# Patient Record
Sex: Male | Born: 1950 | Race: Black or African American | Hispanic: No | Smoking: Never smoker
Health system: Southern US, Community
[De-identification: ages and names within clinical notes are randomized; demographics above are authoritative.]

## PROBLEM LIST (undated history)

## (undated) ENCOUNTER — Emergency Department (HOSPITAL_COMMUNITY): Admission: EM | Payer: Medicare Other | Source: Home / Self Care

## (undated) ENCOUNTER — Emergency Department (HOSPITAL_COMMUNITY): Payer: 59

## (undated) DIAGNOSIS — J189 Pneumonia, unspecified organism: Secondary | ICD-10-CM

## (undated) DIAGNOSIS — N189 Chronic kidney disease, unspecified: Secondary | ICD-10-CM

## (undated) DIAGNOSIS — G894 Chronic pain syndrome: Secondary | ICD-10-CM

## (undated) DIAGNOSIS — E119 Type 2 diabetes mellitus without complications: Secondary | ICD-10-CM

## (undated) DIAGNOSIS — I5032 Chronic diastolic (congestive) heart failure: Secondary | ICD-10-CM

## (undated) DIAGNOSIS — F419 Anxiety disorder, unspecified: Secondary | ICD-10-CM

## (undated) DIAGNOSIS — M1990 Unspecified osteoarthritis, unspecified site: Secondary | ICD-10-CM

## (undated) DIAGNOSIS — E349 Endocrine disorder, unspecified: Secondary | ICD-10-CM

## (undated) DIAGNOSIS — D631 Anemia in chronic kidney disease: Secondary | ICD-10-CM

## (undated) DIAGNOSIS — R1319 Other dysphagia: Secondary | ICD-10-CM

## (undated) DIAGNOSIS — I471 Supraventricular tachycardia, unspecified: Secondary | ICD-10-CM

## (undated) DIAGNOSIS — R51 Headache: Secondary | ICD-10-CM

## (undated) DIAGNOSIS — K5790 Diverticulosis of intestine, part unspecified, without perforation or abscess without bleeding: Secondary | ICD-10-CM

## (undated) DIAGNOSIS — D509 Iron deficiency anemia, unspecified: Secondary | ICD-10-CM

## (undated) DIAGNOSIS — N281 Cyst of kidney, acquired: Secondary | ICD-10-CM

## (undated) DIAGNOSIS — Z9841 Cataract extraction status, right eye: Secondary | ICD-10-CM

## (undated) DIAGNOSIS — E1140 Type 2 diabetes mellitus with diabetic neuropathy, unspecified: Secondary | ICD-10-CM

## (undated) DIAGNOSIS — I700 Atherosclerosis of aorta: Secondary | ICD-10-CM

## (undated) DIAGNOSIS — D649 Anemia, unspecified: Secondary | ICD-10-CM

## (undated) DIAGNOSIS — C9000 Multiple myeloma not having achieved remission: Secondary | ICD-10-CM

## (undated) DIAGNOSIS — I10 Essential (primary) hypertension: Secondary | ICD-10-CM

## (undated) DIAGNOSIS — M2041 Other hammer toe(s) (acquired), right foot: Secondary | ICD-10-CM

## (undated) DIAGNOSIS — E559 Vitamin D deficiency, unspecified: Secondary | ICD-10-CM

## (undated) DIAGNOSIS — Z95828 Presence of other vascular implants and grafts: Secondary | ICD-10-CM

## (undated) DIAGNOSIS — E785 Hyperlipidemia, unspecified: Secondary | ICD-10-CM

## (undated) DIAGNOSIS — R339 Retention of urine, unspecified: Secondary | ICD-10-CM

## (undated) DIAGNOSIS — D2262 Melanocytic nevi of left upper limb, including shoulder: Secondary | ICD-10-CM

## (undated) DIAGNOSIS — R519 Headache, unspecified: Secondary | ICD-10-CM

## (undated) DIAGNOSIS — Z8489 Family history of other specified conditions: Secondary | ICD-10-CM

## (undated) DIAGNOSIS — N529 Male erectile dysfunction, unspecified: Secondary | ICD-10-CM

## (undated) DIAGNOSIS — Z79891 Long term (current) use of opiate analgesic: Secondary | ICD-10-CM

## (undated) DIAGNOSIS — N400 Enlarged prostate without lower urinary tract symptoms: Secondary | ICD-10-CM

## (undated) DIAGNOSIS — I493 Ventricular premature depolarization: Secondary | ICD-10-CM

## (undated) DIAGNOSIS — L720 Epidermal cyst: Secondary | ICD-10-CM

## (undated) DIAGNOSIS — M2550 Pain in unspecified joint: Secondary | ICD-10-CM

## (undated) DIAGNOSIS — K219 Gastro-esophageal reflux disease without esophagitis: Secondary | ICD-10-CM

## (undated) DIAGNOSIS — N289 Disorder of kidney and ureter, unspecified: Secondary | ICD-10-CM

## (undated) DIAGNOSIS — M797 Fibromyalgia: Secondary | ICD-10-CM

## (undated) DIAGNOSIS — N184 Chronic kidney disease, stage 4 (severe): Secondary | ICD-10-CM

## (undated) HISTORY — DX: Hyperlipidemia, unspecified: E78.5

## (undated) HISTORY — DX: Pain in unspecified joint: M25.50

## (undated) HISTORY — DX: Endocrine disorder, unspecified: E34.9

## (undated) HISTORY — PX: COLONOSCOPY: SHX174

## (undated) HISTORY — DX: Type 2 diabetes mellitus with diabetic neuropathy, unspecified: E11.40

## (undated) HISTORY — DX: Multiple myeloma not having achieved remission: C90.00

## (undated) HISTORY — PX: FRACTURE SURGERY: SHX138

## (undated) HISTORY — DX: Diverticulosis of intestine, part unspecified, without perforation or abscess without bleeding: K57.90

## (undated) HISTORY — PX: KNEE SURGERY: SHX244

## (undated) HISTORY — DX: Chronic kidney disease, unspecified: N18.9

## (undated) HISTORY — DX: Anxiety disorder, unspecified: F41.9

## (undated) HISTORY — DX: Anemia in chronic kidney disease: D63.1

## (undated) HISTORY — DX: Anemia, unspecified: D64.9

## (undated) HISTORY — PX: TONSILLECTOMY: SUR1361

## (undated) HISTORY — DX: Iron deficiency anemia, unspecified: D50.9

## (undated) HISTORY — DX: Unspecified osteoarthritis, unspecified site: M19.90

## (undated) HISTORY — DX: Type 2 diabetes mellitus without complications: E11.9

## (undated) HISTORY — PX: ESOPHAGOGASTRODUODENOSCOPY: SHX1529

## (undated) HISTORY — PX: CATARACT EXTRACTION: SUR2

## (undated) HISTORY — PX: PORTACATH PLACEMENT: SHX2246

## (undated) HISTORY — PX: KNEE ARTHROSCOPY: SUR90

## (undated) HISTORY — DX: Retention of urine, unspecified: R33.9

---
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
## 1997-09-12 ENCOUNTER — Emergency Department (HOSPITAL_COMMUNITY): Admission: EM | Admit: 1997-09-12 | Discharge: 1997-09-12 | Payer: Self-pay | Admitting: Emergency Medicine

## 2000-12-02 ENCOUNTER — Emergency Department (HOSPITAL_COMMUNITY): Admission: EM | Admit: 2000-12-02 | Discharge: 2000-12-02 | Payer: Self-pay | Admitting: *Deleted

## 2000-12-06 ENCOUNTER — Emergency Department (HOSPITAL_COMMUNITY): Admission: EM | Admit: 2000-12-06 | Discharge: 2000-12-06 | Payer: Self-pay | Admitting: Emergency Medicine

## 2005-12-11 ENCOUNTER — Emergency Department (HOSPITAL_COMMUNITY): Admission: EM | Admit: 2005-12-11 | Discharge: 2005-12-11 | Payer: Self-pay | Admitting: *Deleted

## 2006-02-08 ENCOUNTER — Emergency Department (HOSPITAL_COMMUNITY): Admission: EM | Admit: 2006-02-08 | Discharge: 2006-02-08 | Payer: Self-pay | Admitting: Emergency Medicine

## 2006-03-16 ENCOUNTER — Emergency Department (HOSPITAL_COMMUNITY): Admission: EM | Admit: 2006-03-16 | Discharge: 2006-03-16 | Payer: Self-pay | Admitting: Emergency Medicine

## 2006-08-28 ENCOUNTER — Emergency Department (HOSPITAL_COMMUNITY): Admission: EM | Admit: 2006-08-28 | Discharge: 2006-08-28 | Payer: Self-pay | Admitting: Emergency Medicine

## 2006-08-31 ENCOUNTER — Ambulatory Visit: Payer: Self-pay | Admitting: Hematology & Oncology

## 2006-08-31 ENCOUNTER — Inpatient Hospital Stay (HOSPITAL_COMMUNITY): Admission: AD | Admit: 2006-08-31 | Discharge: 2006-09-07 | Payer: Self-pay | Admitting: Internal Medicine

## 2006-08-31 ENCOUNTER — Emergency Department (HOSPITAL_COMMUNITY): Admission: EM | Admit: 2006-08-31 | Discharge: 2006-08-31 | Payer: Self-pay | Admitting: Emergency Medicine

## 2006-09-22 ENCOUNTER — Emergency Department (HOSPITAL_COMMUNITY): Admission: EM | Admit: 2006-09-22 | Discharge: 2006-09-22 | Payer: Self-pay | Admitting: Family Medicine

## 2006-09-28 ENCOUNTER — Ambulatory Visit: Payer: Self-pay | Admitting: Hematology & Oncology

## 2006-10-21 ENCOUNTER — Ambulatory Visit: Payer: Self-pay | Admitting: Gastroenterology

## 2006-10-27 LAB — CBC & DIFF AND RETIC
BASO%: 0.8 % (ref 0.0–2.0)
EOS%: 0.7 % (ref 0.0–7.0)
HCT: 32.6 % — ABNORMAL LOW (ref 38.7–49.9)
IRF: 0.22 (ref 0.070–0.380)
MCH: 26.5 pg — ABNORMAL LOW (ref 28.0–33.4)
MCHC: 34 g/dL (ref 32.0–35.9)
NEUT%: 56.1 % (ref 40.0–75.0)
RBC: 4.19 10*6/uL — ABNORMAL LOW (ref 4.20–5.71)
Retic %: 0.2 % — ABNORMAL LOW (ref 0.7–2.3)
WBC: 7.8 10*3/uL (ref 4.0–10.0)
lymph#: 2.7 10*3/uL (ref 0.9–3.3)

## 2006-10-29 LAB — LACTATE DEHYDROGENASE: LDH: 170 U/L (ref 94–250)

## 2006-10-29 LAB — COMPREHENSIVE METABOLIC PANEL
AST: 45 U/L — ABNORMAL HIGH (ref 0–37)
BUN: 36 mg/dL — ABNORMAL HIGH (ref 6–23)
CO2: 22 mEq/L (ref 19–32)
Calcium: 9.5 mg/dL (ref 8.4–10.5)
Chloride: 103 mEq/L (ref 96–112)
Creatinine, Ser: 1.42 mg/dL (ref 0.40–1.50)
Total Bilirubin: 0.3 mg/dL (ref 0.3–1.2)

## 2006-10-29 LAB — SPEP & IFE WITH QIG
Alpha-1-Globulin: 3.5 % (ref 2.9–4.9)
Alpha-2-Globulin: 13 % — ABNORMAL HIGH (ref 7.1–11.8)
Total Protein, Serum Electrophoresis: 8.5 g/dL — ABNORMAL HIGH (ref 6.0–8.3)

## 2006-10-29 LAB — RPR

## 2006-10-29 LAB — BETA 2 MICROGLOBULIN, SERUM: Beta-2 Microglobulin: 1.77 mg/L — ABNORMAL HIGH (ref 1.01–1.73)

## 2006-10-29 LAB — ERYTHROPOIETIN: Erythropoietin: 7.7 m[IU]/mL (ref 2.6–34.0)

## 2006-11-08 ENCOUNTER — Encounter: Payer: Self-pay | Admitting: Hematology & Oncology

## 2006-11-08 ENCOUNTER — Ambulatory Visit (HOSPITAL_COMMUNITY): Admission: RE | Admit: 2006-11-08 | Discharge: 2006-11-08 | Payer: Self-pay | Admitting: Hematology & Oncology

## 2006-11-09 ENCOUNTER — Ambulatory Visit: Payer: Self-pay | Admitting: Gastroenterology

## 2006-11-13 ENCOUNTER — Emergency Department (HOSPITAL_COMMUNITY): Admission: EM | Admit: 2006-11-13 | Discharge: 2006-11-13 | Payer: Self-pay | Admitting: Emergency Medicine

## 2006-12-03 ENCOUNTER — Ambulatory Visit: Payer: Self-pay | Admitting: Hematology & Oncology

## 2006-12-15 ENCOUNTER — Ambulatory Visit (HOSPITAL_COMMUNITY): Admission: RE | Admit: 2006-12-15 | Discharge: 2006-12-15 | Payer: Self-pay | Admitting: Hematology & Oncology

## 2006-12-17 LAB — BASIC METABOLIC PANEL
CO2: 24 mEq/L (ref 19–32)
Calcium: 9.5 mg/dL (ref 8.4–10.5)
Chloride: 104 mEq/L (ref 96–112)
Potassium: 4 mEq/L (ref 3.5–5.3)
Sodium: 137 mEq/L (ref 135–145)

## 2006-12-19 ENCOUNTER — Inpatient Hospital Stay (HOSPITAL_COMMUNITY): Admission: EM | Admit: 2006-12-19 | Discharge: 2006-12-25 | Payer: Self-pay | Admitting: Emergency Medicine

## 2006-12-19 ENCOUNTER — Ambulatory Visit: Payer: Self-pay | Admitting: Oncology

## 2007-01-04 LAB — CBC WITH DIFFERENTIAL/PLATELET
Basophils Absolute: 0.1 10*3/uL (ref 0.0–0.1)
Eosinophils Absolute: 0.2 10*3/uL (ref 0.0–0.5)
HCT: 34.4 % — ABNORMAL LOW (ref 38.7–49.9)
HGB: 10.9 g/dL — ABNORMAL LOW (ref 13.0–17.1)
MCV: 78.9 fL — ABNORMAL LOW (ref 81.6–98.0)
MONO%: 9.6 % (ref 0.0–13.0)
NEUT#: 4.3 10*3/uL (ref 1.5–6.5)
NEUT%: 58 % (ref 40.0–75.0)
Platelets: 388 10*3/uL (ref 145–400)
RDW: 13.1 % (ref 11.2–14.6)

## 2007-01-11 ENCOUNTER — Inpatient Hospital Stay (HOSPITAL_COMMUNITY): Admission: EM | Admit: 2007-01-11 | Discharge: 2007-01-15 | Payer: Self-pay | Admitting: Emergency Medicine

## 2007-01-19 ENCOUNTER — Emergency Department (HOSPITAL_COMMUNITY): Admission: EM | Admit: 2007-01-19 | Discharge: 2007-01-20 | Payer: Self-pay | Admitting: Emergency Medicine

## 2007-01-19 ENCOUNTER — Ambulatory Visit: Payer: Self-pay | Admitting: Oncology

## 2007-02-02 LAB — CBC WITH DIFFERENTIAL/PLATELET
Basophils Absolute: 0.1 10*3/uL (ref 0.0–0.1)
EOS%: 2.9 % (ref 0.0–7.0)
Eosinophils Absolute: 0.2 10*3/uL (ref 0.0–0.5)
LYMPH%: 24.8 % (ref 14.0–48.0)
MCH: 25.7 pg — ABNORMAL LOW (ref 28.0–33.4)
MCV: 78.6 fL — ABNORMAL LOW (ref 81.6–98.0)
MONO%: 8.1 % (ref 0.0–13.0)
NEUT#: 4 10*3/uL (ref 1.5–6.5)
Platelets: 178 10*3/uL (ref 145–400)
RBC: 4.61 10*6/uL (ref 4.20–5.71)

## 2007-02-02 LAB — ERYTHROCYTE SEDIMENTATION RATE: Sed Rate: 25 mm/hr — ABNORMAL HIGH (ref 0–20)

## 2007-02-04 LAB — KAPPA/LAMBDA LIGHT CHAINS
Kappa free light chain: 0.96 mg/dL (ref 0.33–1.94)
Kappa:Lambda Ratio: 0.74 (ref 0.26–1.65)
Lambda Free Lght Chn: 1.29 mg/dL (ref 0.57–2.63)

## 2007-02-04 LAB — LACTATE DEHYDROGENASE: LDH: 158 U/L (ref 94–250)

## 2007-02-04 LAB — COMPREHENSIVE METABOLIC PANEL
CO2: 22 mEq/L (ref 19–32)
Creatinine, Ser: 1.36 mg/dL (ref 0.40–1.50)
Glucose, Bld: 99 mg/dL (ref 70–99)
Total Bilirubin: 0.5 mg/dL (ref 0.3–1.2)

## 2007-02-04 LAB — PROTEIN ELECTROPHORESIS, SERUM
Albumin ELP: 52.6 % — ABNORMAL LOW (ref 55.8–66.1)
Alpha-1-Globulin: 3.4 % (ref 2.9–4.9)
Beta 2: 4.4 % (ref 3.2–6.5)
Beta Globulin: 5.5 % (ref 4.7–7.2)
Total Protein, Serum Electrophoresis: 8 g/dL (ref 6.0–8.3)

## 2007-02-04 LAB — IGG, IGA, IGM
IgG (Immunoglobin G), Serum: 1830 mg/dL — ABNORMAL HIGH (ref 694–1618)
IgM, Serum: 41 mg/dL — ABNORMAL LOW (ref 60–263)

## 2007-02-15 LAB — CBC WITH DIFFERENTIAL/PLATELET
BASO%: 1 % (ref 0.0–2.0)
Basophils Absolute: 0.1 10*3/uL (ref 0.0–0.1)
EOS%: 2.4 % (ref 0.0–7.0)
HGB: 11.7 g/dL — ABNORMAL LOW (ref 13.0–17.1)
MCH: 25.5 pg — ABNORMAL LOW (ref 28.0–33.4)
MCV: 80.1 fL — ABNORMAL LOW (ref 81.6–98.0)
MONO%: 13 % (ref 0.0–13.0)
RBC: 4.58 10*6/uL (ref 4.20–5.71)
RDW: 12.9 % (ref 11.2–14.6)
lymph#: 2.5 10*3/uL (ref 0.9–3.3)

## 2007-02-23 ENCOUNTER — Ambulatory Visit: Payer: Self-pay | Admitting: Oncology

## 2007-02-23 ENCOUNTER — Ambulatory Visit: Payer: Self-pay | Admitting: Hematology & Oncology

## 2007-03-01 LAB — CBC WITH DIFFERENTIAL/PLATELET
Basophils Absolute: 0.1 10*3/uL (ref 0.0–0.1)
Eosinophils Absolute: 0.2 10*3/uL (ref 0.0–0.5)
HGB: 10.9 g/dL — ABNORMAL LOW (ref 13.0–17.1)
MONO#: 1.2 10*3/uL — ABNORMAL HIGH (ref 0.1–0.9)
MONO%: 21.4 % — ABNORMAL HIGH (ref 0.0–13.0)
NEUT#: 2.6 10*3/uL (ref 1.5–6.5)
RBC: 4.17 10*6/uL — ABNORMAL LOW (ref 4.20–5.71)
RDW: 13.2 % (ref 11.2–14.6)
WBC: 5.6 10*3/uL (ref 4.0–10.0)
lymph#: 1.5 10*3/uL (ref 0.9–3.3)

## 2007-03-03 LAB — SPEP & IFE WITH QIG
Albumin ELP: 54.3 % — ABNORMAL LOW (ref 55.8–66.1)
Alpha-1-Globulin: 3.9 % (ref 2.9–4.9)
Gamma Globulin: 17.6 % (ref 11.1–18.8)
IgM, Serum: 37 mg/dL — ABNORMAL LOW (ref 60–263)

## 2007-03-03 LAB — COMPREHENSIVE METABOLIC PANEL
ALT: 29 U/L (ref 0–53)
AST: 21 U/L (ref 0–37)
Albumin: 4.1 g/dL (ref 3.5–5.2)
Calcium: 9.1 mg/dL (ref 8.4–10.5)
Chloride: 107 mEq/L (ref 96–112)
Potassium: 4.2 mEq/L (ref 3.5–5.3)

## 2007-03-14 ENCOUNTER — Inpatient Hospital Stay (HOSPITAL_COMMUNITY): Admission: EM | Admit: 2007-03-14 | Discharge: 2007-03-18 | Payer: Self-pay | Admitting: Emergency Medicine

## 2007-03-22 LAB — CBC WITH DIFFERENTIAL/PLATELET
BASO%: 3.5 % — ABNORMAL HIGH (ref 0.0–2.0)
HCT: 32.4 % — ABNORMAL LOW (ref 38.7–49.9)
HGB: 10.8 g/dL — ABNORMAL LOW (ref 13.0–17.1)
MCHC: 33.3 g/dL (ref 32.0–35.9)
MONO#: 1.7 10*3/uL — ABNORMAL HIGH (ref 0.1–0.9)
NEUT%: 56.2 % (ref 40.0–75.0)
RDW: 13.8 % (ref 11.2–14.6)
WBC: 8.1 10*3/uL (ref 4.0–10.0)
lymph#: 1.4 10*3/uL (ref 0.9–3.3)

## 2007-03-25 LAB — SPEP & IFE WITH QIG
Gamma Globulin: 14.3 % (ref 11.1–18.8)
IgG (Immunoglobin G), Serum: 964 mg/dL (ref 694–1618)
IgM, Serum: 29 mg/dL — ABNORMAL LOW (ref 60–263)
M-Spike, %: 0.53 g/dL

## 2007-03-25 LAB — COMPREHENSIVE METABOLIC PANEL
ALT: 50 U/L (ref 0–53)
Albumin: 4.2 g/dL (ref 3.5–5.2)
CO2: 22 mEq/L (ref 19–32)
Calcium: 9.6 mg/dL (ref 8.4–10.5)
Chloride: 106 mEq/L (ref 96–112)
Creatinine, Ser: 1.17 mg/dL (ref 0.40–1.50)
Potassium: 4.2 mEq/L (ref 3.5–5.3)
Sodium: 139 mEq/L (ref 135–145)
Total Protein: 7 g/dL (ref 6.0–8.3)

## 2007-03-25 LAB — KAPPA/LAMBDA LIGHT CHAINS

## 2007-04-01 LAB — CBC WITH DIFFERENTIAL/PLATELET
BASO%: 1.1 % (ref 0.0–2.0)
Basophils Absolute: 0.1 10*3/uL (ref 0.0–0.1)
EOS%: 2.9 % (ref 0.0–7.0)
HCT: 32.9 % — ABNORMAL LOW (ref 38.7–49.9)
HGB: 10.6 g/dL — ABNORMAL LOW (ref 13.0–17.1)
LYMPH%: 26.3 % (ref 14.0–48.0)
MCH: 24.7 pg — ABNORMAL LOW (ref 28.0–33.4)
MCHC: 32.1 g/dL (ref 32.0–35.9)
MCV: 76.9 fL — ABNORMAL LOW (ref 81.6–98.0)
MONO%: 15.4 % — ABNORMAL HIGH (ref 0.0–13.0)
NEUT%: 54.3 % (ref 40.0–75.0)

## 2007-04-03 ENCOUNTER — Emergency Department (HOSPITAL_COMMUNITY): Admission: EM | Admit: 2007-04-03 | Discharge: 2007-04-03 | Payer: Self-pay | Admitting: Emergency Medicine

## 2007-04-05 ENCOUNTER — Inpatient Hospital Stay (HOSPITAL_COMMUNITY): Admission: EM | Admit: 2007-04-05 | Discharge: 2007-04-19 | Payer: Self-pay | Admitting: Emergency Medicine

## 2007-04-05 ENCOUNTER — Ambulatory Visit: Payer: Self-pay | Admitting: Hematology & Oncology

## 2007-04-08 ENCOUNTER — Ambulatory Visit: Payer: Self-pay | Admitting: Oncology

## 2007-04-14 ENCOUNTER — Ambulatory Visit: Payer: Self-pay | Admitting: Internal Medicine

## 2007-04-20 ENCOUNTER — Emergency Department (HOSPITAL_COMMUNITY): Admission: EM | Admit: 2007-04-20 | Discharge: 2007-04-20 | Payer: Self-pay | Admitting: Emergency Medicine

## 2007-04-26 LAB — CBC WITH DIFFERENTIAL/PLATELET
BASO%: 1 % (ref 0.0–2.0)
EOS%: 5.4 % (ref 0.0–7.0)
LYMPH%: 29 % (ref 14.0–48.0)
MCH: 25.9 pg — ABNORMAL LOW (ref 28.0–33.4)
MCHC: 32.2 g/dL (ref 32.0–35.9)
MCV: 80.6 fL — ABNORMAL LOW (ref 81.6–98.0)
MONO%: 9.8 % (ref 0.0–13.0)
NEUT#: 3.2 10*3/uL (ref 1.5–6.5)
Platelets: 288 10*3/uL (ref 145–400)
RBC: 4.3 10*6/uL (ref 4.20–5.71)
RDW: 19.3 % — ABNORMAL HIGH (ref 11.2–14.6)

## 2007-04-26 LAB — COMPREHENSIVE METABOLIC PANEL
AST: 13 U/L (ref 0–37)
Albumin: 4.2 g/dL (ref 3.5–5.2)
Alkaline Phosphatase: 37 U/L — ABNORMAL LOW (ref 39–117)
Potassium: 4.1 mEq/L (ref 3.5–5.3)
Sodium: 141 mEq/L (ref 135–145)
Total Bilirubin: 0.4 mg/dL (ref 0.3–1.2)
Total Protein: 7 g/dL (ref 6.0–8.3)

## 2007-04-28 ENCOUNTER — Emergency Department (HOSPITAL_COMMUNITY): Admission: EM | Admit: 2007-04-28 | Discharge: 2007-04-29 | Payer: Self-pay | Admitting: Emergency Medicine

## 2007-04-28 LAB — BETA 2 MICROGLOBULIN, SERUM: Beta-2 Microglobulin: 2.54 mg/L — ABNORMAL HIGH (ref 1.01–1.73)

## 2007-04-28 LAB — PROTEIN ELECTROPHORESIS, SERUM
Albumin ELP: 54.7 % — ABNORMAL LOW (ref 55.8–66.1)
Beta 2: 4.5 % (ref 3.2–6.5)
Gamma Globulin: 16.7 % (ref 11.1–18.8)

## 2007-04-28 LAB — IGG, IGA, IGM: IgA: 141 mg/dL (ref 68–378)

## 2007-06-02 ENCOUNTER — Ambulatory Visit: Payer: Self-pay | Admitting: Oncology

## 2007-06-10 LAB — CBC WITH DIFFERENTIAL/PLATELET
Basophils Absolute: 0 10*3/uL (ref 0.0–0.1)
Eosinophils Absolute: 0.2 10*3/uL (ref 0.0–0.5)
HCT: 31.4 % — ABNORMAL LOW (ref 38.7–49.9)
HGB: 10.6 g/dL — ABNORMAL LOW (ref 13.0–17.1)
MCV: 78.3 fL — ABNORMAL LOW (ref 81.6–98.0)
MONO%: 6.8 % (ref 0.0–13.0)
NEUT#: 5.4 10*3/uL (ref 1.5–6.5)
RDW: 18.2 % — ABNORMAL HIGH (ref 11.2–14.6)
lymph#: 1.3 10*3/uL (ref 0.9–3.3)

## 2007-06-14 ENCOUNTER — Emergency Department (HOSPITAL_COMMUNITY): Admission: EM | Admit: 2007-06-14 | Discharge: 2007-06-14 | Payer: Self-pay | Admitting: Emergency Medicine

## 2007-06-14 LAB — PROTEIN ELECTROPHORESIS, SERUM
Albumin ELP: 56.1 % (ref 55.8–66.1)
Alpha-1-Globulin: 4.4 % (ref 2.9–4.9)

## 2007-06-14 LAB — COMPREHENSIVE METABOLIC PANEL
ALT: 25 U/L (ref 0–53)
AST: 24 U/L (ref 0–37)
Albumin: 4.6 g/dL (ref 3.5–5.2)
Alkaline Phosphatase: 44 U/L (ref 39–117)
Glucose, Bld: 112 mg/dL — ABNORMAL HIGH (ref 70–99)
Potassium: 4 mEq/L (ref 3.5–5.3)
Sodium: 141 mEq/L (ref 135–145)
Total Protein: 7.7 g/dL (ref 6.0–8.3)

## 2007-06-14 LAB — KAPPA/LAMBDA LIGHT CHAINS
Kappa free light chain: 1.67 mg/dL (ref 0.33–1.94)
Kappa:Lambda Ratio: 1.86 — ABNORMAL HIGH (ref 0.26–1.65)

## 2007-06-14 LAB — IGG, IGA, IGM
IgA: 171 mg/dL (ref 68–378)
IgM, Serum: 43 mg/dL — ABNORMAL LOW (ref 60–263)

## 2007-06-15 ENCOUNTER — Encounter (HOSPITAL_BASED_OUTPATIENT_CLINIC_OR_DEPARTMENT_OTHER): Admission: RE | Admit: 2007-06-15 | Discharge: 2007-09-13 | Payer: Self-pay | Admitting: Surgery

## 2007-07-05 ENCOUNTER — Ambulatory Visit: Payer: Self-pay | Admitting: Hematology & Oncology

## 2007-07-05 ENCOUNTER — Ambulatory Visit (HOSPITAL_COMMUNITY): Admission: RE | Admit: 2007-07-05 | Discharge: 2007-07-05 | Payer: Self-pay | Admitting: Hematology & Oncology

## 2007-07-05 ENCOUNTER — Encounter: Payer: Self-pay | Admitting: Hematology & Oncology

## 2007-07-20 ENCOUNTER — Ambulatory Visit: Payer: Self-pay | Admitting: Oncology

## 2007-07-22 LAB — CBC WITH DIFFERENTIAL/PLATELET
BASO%: 0.9 % (ref 0.0–2.0)
LYMPH%: 32.8 % (ref 14.0–48.0)
MCHC: 32.9 g/dL (ref 32.0–35.9)
MCV: 80.1 fL — ABNORMAL LOW (ref 81.6–98.0)
MONO#: 0.6 10*3/uL (ref 0.1–0.9)
MONO%: 11.8 % (ref 0.0–13.0)
NEUT#: 2.6 10*3/uL (ref 1.5–6.5)
Platelets: 228 10*3/uL (ref 145–400)
RBC: 3.74 10*6/uL — ABNORMAL LOW (ref 4.20–5.71)
RDW: 16.6 % — ABNORMAL HIGH (ref 11.2–14.6)
WBC: 5.2 10*3/uL (ref 4.0–10.0)

## 2007-07-26 LAB — COMPREHENSIVE METABOLIC PANEL
ALT: 22 U/L (ref 0–53)
Albumin: 4.5 g/dL (ref 3.5–5.2)
Alkaline Phosphatase: 42 U/L (ref 39–117)
Potassium: 3.7 mEq/L (ref 3.5–5.3)
Sodium: 141 mEq/L (ref 135–145)
Total Bilirubin: 0.3 mg/dL (ref 0.3–1.2)
Total Protein: 7.3 g/dL (ref 6.0–8.3)

## 2007-07-26 LAB — PROTEIN ELECTROPHORESIS, SERUM
Albumin ELP: 58.9 % (ref 55.8–66.1)
Alpha-1-Globulin: 3.6 % (ref 2.9–4.9)
Alpha-2-Globulin: 13.3 % — ABNORMAL HIGH (ref 7.1–11.8)
Beta 2: 4.7 % (ref 3.2–6.5)
Beta Globulin: 5.4 % (ref 4.7–7.2)

## 2007-07-26 LAB — KAPPA/LAMBDA LIGHT CHAINS: Lambda Free Lght Chn: 0.98 mg/dL (ref 0.57–2.63)

## 2007-08-19 LAB — CBC WITH DIFFERENTIAL/PLATELET
EOS%: 4.8 % (ref 0.0–7.0)
Eosinophils Absolute: 0.3 10*3/uL (ref 0.0–0.5)
LYMPH%: 29.8 % (ref 14.0–48.0)
MCH: 25.7 pg — ABNORMAL LOW (ref 28.0–33.4)
MCV: 81.8 fL (ref 81.6–98.0)
MONO%: 11.4 % (ref 0.0–13.0)
NEUT#: 3 10*3/uL (ref 1.5–6.5)
Platelets: 240 10*3/uL (ref 145–400)
RBC: 4.62 10*6/uL (ref 4.20–5.71)
RDW: 15.9 % — ABNORMAL HIGH (ref 11.2–14.6)

## 2007-09-08 ENCOUNTER — Ambulatory Visit: Payer: Self-pay | Admitting: Hematology & Oncology

## 2007-09-08 LAB — MANUAL DIFFERENTIAL (CHCC SATELLITE)
BASO: 1 % (ref 0–2)
Eos: 5 % (ref 0–7)
PLT EST ~~LOC~~: ADEQUATE

## 2007-09-08 LAB — CBC WITH DIFFERENTIAL (CANCER CENTER ONLY)
HCT: 43.7 % (ref 38.7–49.9)
HGB: 14 g/dL (ref 13.0–17.1)
MCHC: 32 g/dL (ref 32.0–35.9)
RDW: 13.1 % (ref 10.5–14.6)
WBC: 4.8 10*3/uL (ref 4.0–10.0)

## 2007-09-12 LAB — IGG, IGA, IGM
IgA: 195 mg/dL (ref 68–378)
IgG (Immunoglobin G), Serum: 1370 mg/dL (ref 694–1618)

## 2007-09-12 LAB — PROTEIN ELECTROPHORESIS, SERUM
Alpha-1-Globulin: 3.6 % (ref 2.9–4.9)
Alpha-2-Globulin: 12.6 % — ABNORMAL HIGH (ref 7.1–11.8)
Total Protein, Serum Electrophoresis: 7.5 g/dL (ref 6.0–8.3)

## 2007-09-12 LAB — COMPREHENSIVE METABOLIC PANEL
ALT: 19 U/L (ref 0–53)
AST: 20 U/L (ref 0–37)
Creatinine, Ser: 1.07 mg/dL (ref 0.40–1.50)
Total Bilirubin: 0.5 mg/dL (ref 0.3–1.2)

## 2007-09-12 LAB — KAPPA/LAMBDA LIGHT CHAINS: Kappa:Lambda Ratio: 0.67 (ref 0.26–1.65)

## 2007-09-12 LAB — BETA 2 MICROGLOBULIN, SERUM: Beta-2 Microglobulin: 2.17 mg/L — ABNORMAL HIGH (ref 1.01–1.73)

## 2007-09-14 ENCOUNTER — Emergency Department (HOSPITAL_COMMUNITY): Admission: EM | Admit: 2007-09-14 | Discharge: 2007-09-14 | Payer: Self-pay | Admitting: Emergency Medicine

## 2007-09-16 ENCOUNTER — Encounter (HOSPITAL_BASED_OUTPATIENT_CLINIC_OR_DEPARTMENT_OTHER): Admission: RE | Admit: 2007-09-16 | Discharge: 2007-10-25 | Payer: Self-pay | Admitting: Internal Medicine

## 2007-09-16 LAB — CBC WITH DIFFERENTIAL (CANCER CENTER ONLY)
BASO%: 0.6 % (ref 0.0–2.0)
EOS%: 4.3 % (ref 0.0–7.0)
LYMPH%: 34.9 % (ref 14.0–48.0)
MCHC: 32.2 g/dL (ref 32.0–35.9)
MCV: 80 fL — ABNORMAL LOW (ref 82–98)
MONO#: 0.5 10*3/uL (ref 0.1–0.9)
MONO%: 8.5 % (ref 0.0–13.0)
NEUT%: 51.7 % (ref 40.0–80.0)
Platelets: 232 10*3/uL (ref 145–400)
RDW: 12.7 % (ref 10.5–14.6)
WBC: 5.6 10*3/uL (ref 4.0–10.0)

## 2007-09-20 LAB — PROTEIN ELECTROPHORESIS, SERUM
Alpha-1-Globulin: 3.5 % (ref 2.9–4.9)
Alpha-2-Globulin: 12.2 % — ABNORMAL HIGH (ref 7.1–11.8)
Beta Globulin: 5.7 % (ref 4.7–7.2)
Total Protein, Serum Electrophoresis: 7.4 g/dL (ref 6.0–8.3)

## 2007-09-20 LAB — FERRITIN: Ferritin: 176 ng/mL (ref 22–322)

## 2007-10-01 ENCOUNTER — Emergency Department (HOSPITAL_COMMUNITY): Admission: EM | Admit: 2007-10-01 | Discharge: 2007-10-01 | Payer: Self-pay | Admitting: Emergency Medicine

## 2007-10-14 LAB — CBC WITH DIFFERENTIAL (CANCER CENTER ONLY)
BASO#: 0 10*3/uL (ref 0.0–0.2)
Eosinophils Absolute: 0.3 10*3/uL (ref 0.0–0.5)
HGB: 11.9 g/dL — ABNORMAL LOW (ref 13.0–17.1)
MCV: 76 fL — ABNORMAL LOW (ref 82–98)
MONO#: 0.4 10*3/uL (ref 0.1–0.9)
NEUT#: 2.7 10*3/uL (ref 1.5–6.5)
Platelets: 177 10*3/uL (ref 145–400)
RBC: 4.77 10*6/uL (ref 4.20–5.70)
WBC: 5.1 10*3/uL (ref 4.0–10.0)

## 2007-11-10 ENCOUNTER — Ambulatory Visit: Payer: Self-pay | Admitting: Hematology & Oncology

## 2007-11-11 LAB — CBC WITH DIFFERENTIAL (CANCER CENTER ONLY)
Eosinophils Absolute: 0.3 10*3/uL (ref 0.0–0.5)
HCT: 36 % — ABNORMAL LOW (ref 38.7–49.9)
LYMPH%: 27.1 % (ref 14.0–48.0)
MCH: 23.6 pg — ABNORMAL LOW (ref 28.0–33.4)
MCV: 73 fL — ABNORMAL LOW (ref 82–98)
MONO%: 6.8 % (ref 0.0–13.0)
NEUT%: 61.4 % (ref 40.0–80.0)
Platelets: 209 10*3/uL (ref 145–400)
RBC: 4.96 10*6/uL (ref 4.20–5.70)
RDW: 16.9 % — ABNORMAL HIGH (ref 10.5–14.6)

## 2007-11-16 LAB — COMPREHENSIVE METABOLIC PANEL
ALT: 16 U/L (ref 0–53)
AST: 16 U/L (ref 0–37)
CO2: 24 mEq/L (ref 19–32)
Chloride: 105 mEq/L (ref 96–112)
Creatinine, Ser: 1.09 mg/dL (ref 0.40–1.50)
Sodium: 140 mEq/L (ref 135–145)
Total Bilirubin: 0.4 mg/dL (ref 0.3–1.2)
Total Protein: 7.8 g/dL (ref 6.0–8.3)

## 2007-11-16 LAB — KAPPA/LAMBDA LIGHT CHAINS: Kappa:Lambda Ratio: 0.96 (ref 0.26–1.65)

## 2007-11-16 LAB — SPEP & IFE WITH QIG
Gamma Globulin: 17 % (ref 11.1–18.8)
IgG (Immunoglobin G), Serum: 1430 mg/dL (ref 694–1618)
IgM, Serum: 56 mg/dL — ABNORMAL LOW (ref 60–263)
Total Protein, Serum Electrophoresis: 7.8 g/dL (ref 6.0–8.3)

## 2007-11-16 LAB — BETA 2 MICROGLOBULIN, SERUM: Beta-2 Microglobulin: 1.76 mg/L — ABNORMAL HIGH (ref 1.01–1.73)

## 2007-12-29 ENCOUNTER — Ambulatory Visit: Payer: Self-pay | Admitting: Hematology & Oncology

## 2007-12-30 LAB — CBC WITH DIFFERENTIAL (CANCER CENTER ONLY)
BASO#: 0 10*3/uL (ref 0.0–0.2)
Eosinophils Absolute: 0.2 10*3/uL (ref 0.0–0.5)
HCT: 30.2 % — ABNORMAL LOW (ref 38.7–49.9)
HGB: 10.2 g/dL — ABNORMAL LOW (ref 13.0–17.1)
LYMPH%: 36.5 % (ref 14.0–48.0)
MCH: 25 pg — ABNORMAL LOW (ref 28.0–33.4)
MCV: 74 fL — ABNORMAL LOW (ref 82–98)
MONO#: 0.5 10*3/uL (ref 0.1–0.9)
MONO%: 7.8 % (ref 0.0–13.0)
RBC: 4.08 10*6/uL — ABNORMAL LOW (ref 4.20–5.70)

## 2008-01-03 LAB — PROTEIN ELECTROPHORESIS, SERUM
Alpha-1-Globulin: 4.1 % (ref 2.9–4.9)
Alpha-2-Globulin: 14 % — ABNORMAL HIGH (ref 7.1–11.8)
Beta 2: 5.3 % (ref 3.2–6.5)
Gamma Globulin: 16.9 % (ref 11.1–18.8)

## 2008-01-03 LAB — IGG, IGA, IGM: IgG (Immunoglobin G), Serum: 1390 mg/dL (ref 694–1618)

## 2008-01-03 LAB — COMPREHENSIVE METABOLIC PANEL
ALT: 16 U/L (ref 0–53)
AST: 17 U/L (ref 0–37)
Albumin: 4.7 g/dL (ref 3.5–5.2)
Calcium: 9.9 mg/dL (ref 8.4–10.5)
Chloride: 102 mEq/L (ref 96–112)
Potassium: 3.7 mEq/L (ref 3.5–5.3)
Sodium: 139 mEq/L (ref 135–145)
Total Protein: 8.2 g/dL (ref 6.0–8.3)

## 2008-01-03 LAB — BETA 2 MICROGLOBULIN, SERUM: Beta-2 Microglobulin: 1.91 mg/L — ABNORMAL HIGH (ref 1.01–1.73)

## 2008-01-03 LAB — PSA: PSA: 1.15 ng/mL (ref 0.10–4.00)

## 2008-03-01 ENCOUNTER — Ambulatory Visit: Payer: Self-pay | Admitting: Hematology & Oncology

## 2008-03-02 LAB — CBC WITH DIFFERENTIAL (CANCER CENTER ONLY)
BASO#: 0 10*3/uL (ref 0.0–0.2)
Eosinophils Absolute: 0.2 10*3/uL (ref 0.0–0.5)
HGB: 12.1 g/dL — ABNORMAL LOW (ref 13.0–17.1)
LYMPH#: 2.4 10*3/uL (ref 0.9–3.3)
MCH: 26.7 pg — ABNORMAL LOW (ref 28.0–33.4)
MONO#: 0.4 10*3/uL (ref 0.1–0.9)
NEUT#: 3.6 10*3/uL (ref 1.5–6.5)
RBC: 4.51 10*6/uL (ref 4.20–5.70)
WBC: 6.6 10*3/uL (ref 4.0–10.0)

## 2008-03-06 LAB — COMPREHENSIVE METABOLIC PANEL
ALT: 29 U/L (ref 0–53)
CO2: 23 mEq/L (ref 19–32)
Creatinine, Ser: 1.35 mg/dL (ref 0.40–1.50)
Total Bilirubin: 0.3 mg/dL (ref 0.3–1.2)

## 2008-03-06 LAB — SPEP & IFE WITH QIG
Albumin ELP: 53 % — ABNORMAL LOW (ref 55.8–66.1)
IgA: 281 mg/dL (ref 68–378)
IgM, Serum: 85 mg/dL (ref 60–263)
Total Protein, Serum Electrophoresis: 8.4 g/dL — ABNORMAL HIGH (ref 6.0–8.3)

## 2008-03-06 LAB — KAPPA/LAMBDA LIGHT CHAINS
Kappa free light chain: 1.92 mg/dL (ref 0.33–1.94)
Kappa:Lambda Ratio: 1.64 (ref 0.26–1.65)
Lambda Free Lght Chn: 1.17 mg/dL (ref 0.57–2.63)

## 2008-03-06 LAB — BETA 2 MICROGLOBULIN, SERUM: Beta-2 Microglobulin: 2.58 mg/L — ABNORMAL HIGH (ref 1.01–1.73)

## 2008-03-09 ENCOUNTER — Ambulatory Visit: Payer: Self-pay | Admitting: Gastroenterology

## 2008-03-09 DIAGNOSIS — K5730 Diverticulosis of large intestine without perforation or abscess without bleeding: Secondary | ICD-10-CM | POA: Insufficient documentation

## 2008-03-09 DIAGNOSIS — K219 Gastro-esophageal reflux disease without esophagitis: Secondary | ICD-10-CM

## 2008-03-09 DIAGNOSIS — R1319 Other dysphagia: Secondary | ICD-10-CM

## 2008-03-14 ENCOUNTER — Ambulatory Visit: Payer: Self-pay | Admitting: Gastroenterology

## 2008-03-15 ENCOUNTER — Encounter: Payer: Self-pay | Admitting: Gastroenterology

## 2008-04-26 ENCOUNTER — Ambulatory Visit: Payer: Self-pay | Admitting: Hematology & Oncology

## 2008-04-27 LAB — CBC WITH DIFFERENTIAL (CANCER CENTER ONLY)
BASO#: 0 10*3/uL (ref 0.0–0.2)
BASO%: 0.5 % (ref 0.0–2.0)
EOS%: 5.8 % (ref 0.0–7.0)
HGB: 11.4 g/dL — ABNORMAL LOW (ref 13.0–17.1)
LYMPH#: 2.5 10*3/uL (ref 0.9–3.3)
MCHC: 32.8 g/dL (ref 32.0–35.9)
NEUT#: 4 10*3/uL (ref 1.5–6.5)

## 2008-05-01 LAB — COMPREHENSIVE METABOLIC PANEL
AST: 19 U/L (ref 0–37)
Albumin: 4.6 g/dL (ref 3.5–5.2)
BUN: 22 mg/dL (ref 6–23)
Calcium: 9.6 mg/dL (ref 8.4–10.5)
Chloride: 100 mEq/L (ref 96–112)
Glucose, Bld: 140 mg/dL — ABNORMAL HIGH (ref 70–99)
Potassium: 3.5 mEq/L (ref 3.5–5.3)

## 2008-05-01 LAB — PROTEIN ELECTROPHORESIS, SERUM
Albumin ELP: 54.8 % — ABNORMAL LOW (ref 55.8–66.1)
Beta 2: 4.4 % (ref 3.2–6.5)
Total Protein, Serum Electrophoresis: 7.7 g/dL (ref 6.0–8.3)

## 2008-05-01 LAB — KAPPA/LAMBDA LIGHT CHAINS
Kappa free light chain: 0.9 mg/dL (ref 0.33–1.94)
Lambda Free Lght Chn: 1.01 mg/dL (ref 0.57–2.63)

## 2008-05-01 LAB — IGG, IGA, IGM: IgM, Serum: 74 mg/dL (ref 60–263)

## 2008-05-26 ENCOUNTER — Emergency Department (HOSPITAL_COMMUNITY): Admission: EM | Admit: 2008-05-26 | Discharge: 2008-05-26 | Payer: Self-pay | Admitting: Emergency Medicine

## 2008-08-01 ENCOUNTER — Ambulatory Visit: Payer: Self-pay | Admitting: Hematology & Oncology

## 2008-08-03 LAB — CBC WITH DIFFERENTIAL (CANCER CENTER ONLY)
BASO%: 0.5 % (ref 0.0–2.0)
EOS%: 4.4 % (ref 0.0–7.0)
HCT: 32.5 % — ABNORMAL LOW (ref 38.7–49.9)
LYMPH#: 2.7 10*3/uL (ref 0.9–3.3)
MCHC: 32.4 g/dL (ref 32.0–35.9)
NEUT#: 3.2 10*3/uL (ref 1.5–6.5)
NEUT%: 48.3 % (ref 40.0–80.0)
RDW: 13.2 % (ref 10.5–14.6)

## 2008-08-07 LAB — KAPPA/LAMBDA LIGHT CHAINS
Kappa:Lambda Ratio: 1.36 (ref 0.26–1.65)
Lambda Free Lght Chn: 1.18 mg/dL (ref 0.57–2.63)

## 2008-08-07 LAB — PROTEIN ELECTROPHORESIS, SERUM
Alpha-2-Globulin: 13.6 % — ABNORMAL HIGH (ref 7.1–11.8)
Gamma Globulin: 18 % (ref 11.1–18.8)

## 2008-08-07 LAB — COMPREHENSIVE METABOLIC PANEL
Albumin: 4.3 g/dL (ref 3.5–5.2)
BUN: 13 mg/dL (ref 6–23)
Calcium: 9.6 mg/dL (ref 8.4–10.5)
Chloride: 103 mEq/L (ref 96–112)
Glucose, Bld: 135 mg/dL — ABNORMAL HIGH (ref 70–99)
Potassium: 3.6 mEq/L (ref 3.5–5.3)

## 2008-11-01 ENCOUNTER — Ambulatory Visit: Payer: Self-pay | Admitting: Hematology & Oncology

## 2008-11-02 LAB — CBC WITH DIFFERENTIAL (CANCER CENTER ONLY)
BASO%: 0.6 % (ref 0.0–2.0)
HCT: 33.2 % — ABNORMAL LOW (ref 38.7–49.9)
HGB: 10.9 g/dL — ABNORMAL LOW (ref 13.0–17.1)
LYMPH#: 2.6 10*3/uL (ref 0.9–3.3)
MONO#: 0.6 10*3/uL (ref 0.1–0.9)
NEUT%: 54.7 % (ref 40.0–80.0)
RDW: 14.5 % (ref 10.5–14.6)
WBC: 7.7 10*3/uL (ref 4.0–10.0)

## 2008-11-02 LAB — CHCC SATELLITE - SMEAR

## 2008-11-06 LAB — TESTOSTERONE: Testosterone: 328.06 ng/dL — ABNORMAL LOW (ref 350–890)

## 2008-11-06 LAB — VITAMIN D 25 HYDROXY (VIT D DEFICIENCY, FRACTURES): Vit D, 25-Hydroxy: 26 ng/mL — ABNORMAL LOW (ref 30–89)

## 2008-11-06 LAB — SPEP & IFE WITH QIG
Albumin ELP: 50.8 % — ABNORMAL LOW (ref 55.8–66.1)
Alpha-1-Globulin: 4.1 % (ref 2.9–4.9)
Beta 2: 5.8 % (ref 3.2–6.5)
Beta Globulin: 5.8 % (ref 4.7–7.2)

## 2008-11-06 LAB — COMPREHENSIVE METABOLIC PANEL
Alkaline Phosphatase: 58 U/L (ref 39–117)
Glucose, Bld: 102 mg/dL — ABNORMAL HIGH (ref 70–99)
Sodium: 139 mEq/L (ref 135–145)
Total Bilirubin: 0.5 mg/dL (ref 0.3–1.2)
Total Protein: 8.4 g/dL — ABNORMAL HIGH (ref 6.0–8.3)

## 2008-11-06 LAB — KAPPA/LAMBDA LIGHT CHAINS
Kappa free light chain: 1.66 mg/dL (ref 0.33–1.94)
Lambda Free Lght Chn: 1.71 mg/dL (ref 0.57–2.63)

## 2008-11-27 ENCOUNTER — Emergency Department (HOSPITAL_COMMUNITY): Admission: EM | Admit: 2008-11-27 | Discharge: 2008-11-27 | Payer: Self-pay | Admitting: Emergency Medicine

## 2009-01-31 ENCOUNTER — Ambulatory Visit: Payer: Self-pay | Admitting: Hematology & Oncology

## 2009-02-01 LAB — CBC WITH DIFFERENTIAL (CANCER CENTER ONLY)
BASO%: 0.6 % (ref 0.0–2.0)
LYMPH#: 2.5 10*3/uL (ref 0.9–3.3)
LYMPH%: 28.5 % (ref 14.0–48.0)
MONO#: 0.7 10*3/uL (ref 0.1–0.9)
NEUT#: 5.3 10*3/uL (ref 1.5–6.5)
Platelets: 286 10*3/uL (ref 145–400)
RDW: 15.2 % — ABNORMAL HIGH (ref 10.5–14.6)
WBC: 8.8 10*3/uL (ref 4.0–10.0)

## 2009-02-05 LAB — COMPREHENSIVE METABOLIC PANEL
ALT: 19 U/L (ref 0–53)
AST: 24 U/L (ref 0–37)
CO2: 27 mEq/L (ref 19–32)
Calcium: 9.6 mg/dL (ref 8.4–10.5)
Chloride: 100 mEq/L (ref 96–112)
Creatinine, Ser: 1.35 mg/dL (ref 0.40–1.50)
Potassium: 3.7 mEq/L (ref 3.5–5.3)
Sodium: 138 mEq/L (ref 135–145)
Total Protein: 8.1 g/dL (ref 6.0–8.3)

## 2009-02-05 LAB — SPEP & IFE WITH QIG
Alpha-1-Globulin: 3.9 % (ref 2.9–4.9)
Gamma Globulin: 19.5 % — ABNORMAL HIGH (ref 11.1–18.8)
IgM, Serum: 74 mg/dL (ref 60–263)
M-Spike, %: 0.49 g/dL
Total Protein, Serum Electrophoresis: 8.1 g/dL (ref 6.0–8.3)

## 2009-02-05 LAB — KAPPA/LAMBDA LIGHT CHAINS: Kappa free light chain: 0.99 mg/dL (ref 0.33–1.94)

## 2009-02-05 LAB — FERRITIN: Ferritin: 76 ng/mL (ref 22–322)

## 2009-04-01 ENCOUNTER — Ambulatory Visit: Payer: Self-pay | Admitting: Gastroenterology

## 2009-04-04 ENCOUNTER — Ambulatory Visit (HOSPITAL_COMMUNITY): Admission: RE | Admit: 2009-04-04 | Discharge: 2009-04-04 | Payer: Self-pay | Admitting: Gastroenterology

## 2009-04-22 ENCOUNTER — Ambulatory Visit (HOSPITAL_COMMUNITY): Admission: RE | Admit: 2009-04-22 | Discharge: 2009-04-22 | Payer: Self-pay | Admitting: Gastroenterology

## 2009-04-22 ENCOUNTER — Ambulatory Visit: Payer: Self-pay | Admitting: Gastroenterology

## 2009-05-02 ENCOUNTER — Ambulatory Visit: Payer: Self-pay | Admitting: Hematology & Oncology

## 2009-05-03 ENCOUNTER — Encounter: Payer: Self-pay | Admitting: Gastroenterology

## 2009-05-03 LAB — CBC WITH DIFFERENTIAL (CANCER CENTER ONLY)
BASO%: 0.7 % (ref 0.0–2.0)
HCT: 38.3 % — ABNORMAL LOW (ref 38.7–49.9)
LYMPH%: 34.1 % (ref 14.0–48.0)
MCH: 24.4 pg — ABNORMAL LOW (ref 28.0–33.4)
MCV: 77 fL — ABNORMAL LOW (ref 82–98)
MONO%: 7.5 % (ref 0.0–13.0)
NEUT%: 54.2 % (ref 40.0–80.0)
Platelets: 335 10*3/uL (ref 145–400)
RDW: 14.8 % — ABNORMAL HIGH (ref 10.5–14.6)

## 2009-05-03 LAB — TECHNOLOGIST REVIEW CHCC SATELLITE

## 2009-05-07 LAB — SPEP & IFE WITH QIG
Albumin ELP: 52.8 % — ABNORMAL LOW (ref 55.8–66.1)
Alpha-1-Globulin: 4.1 % (ref 2.9–4.9)
Beta 2: 4.5 % (ref 3.2–6.5)
Beta Globulin: 6.4 % (ref 4.7–7.2)
Gamma Globulin: 18.9 % — ABNORMAL HIGH (ref 11.1–18.8)
IgM, Serum: 70 mg/dL (ref 60–263)

## 2009-05-07 LAB — COMPREHENSIVE METABOLIC PANEL
ALT: 15 U/L (ref 0–53)
AST: 18 U/L (ref 0–37)
BUN: 16 mg/dL (ref 6–23)
CO2: 26 mEq/L (ref 19–32)
Calcium: 9.9 mg/dL (ref 8.4–10.5)
Chloride: 101 mEq/L (ref 96–112)
Creatinine, Ser: 1.51 mg/dL — ABNORMAL HIGH (ref 0.40–1.50)
Total Bilirubin: 0.2 mg/dL — ABNORMAL LOW (ref 0.3–1.2)

## 2009-05-07 LAB — KAPPA/LAMBDA LIGHT CHAINS
Kappa free light chain: 1.5 mg/dL (ref 0.33–1.94)
Kappa:Lambda Ratio: 1.03 (ref 0.26–1.65)

## 2009-05-07 LAB — FERRITIN: Ferritin: 32 ng/mL (ref 22–322)

## 2009-05-07 LAB — BETA 2 MICROGLOBULIN, SERUM: Beta-2 Microglobulin: 2.15 mg/L — ABNORMAL HIGH (ref 1.01–1.73)

## 2009-05-15 ENCOUNTER — Telehealth: Payer: Self-pay | Admitting: Gastroenterology

## 2009-05-16 ENCOUNTER — Ambulatory Visit: Payer: Self-pay | Admitting: Gastroenterology

## 2009-05-17 ENCOUNTER — Telehealth: Payer: Self-pay | Admitting: Gastroenterology

## 2009-05-31 LAB — CBC WITH DIFFERENTIAL (CANCER CENTER ONLY)
BASO#: 0.1 10*3/uL (ref 0.0–0.2)
Eosinophils Absolute: 0.3 10*3/uL (ref 0.0–0.5)
HCT: 39.1 % (ref 38.7–49.9)
HGB: 12.5 g/dL — ABNORMAL LOW (ref 13.0–17.1)
LYMPH%: 35.8 % (ref 14.0–48.0)
MCH: 24 pg — ABNORMAL LOW (ref 28.0–33.4)
MCV: 75 fL — ABNORMAL LOW (ref 82–98)
MONO#: 0.7 10*3/uL (ref 0.1–0.9)
Platelets: 304 10*3/uL (ref 145–400)
RBC: 5.2 10*6/uL (ref 4.20–5.70)
WBC: 9 10*3/uL (ref 4.0–10.0)

## 2009-06-04 LAB — SPEP & IFE WITH QIG
Alpha-2-Globulin: 13.5 % — ABNORMAL HIGH (ref 7.1–11.8)
Beta Globulin: 6.3 % (ref 4.7–7.2)
Gamma Globulin: 19 % — ABNORMAL HIGH (ref 11.1–18.8)
IgA: 350 mg/dL (ref 68–378)
IgG (Immunoglobin G), Serum: 1660 mg/dL — ABNORMAL HIGH (ref 694–1618)
M-Spike, %: 0.52 g/dL

## 2009-06-04 LAB — TESTOSTERONE: Testosterone: 158.93 ng/dL — ABNORMAL LOW (ref 350–890)

## 2009-06-04 LAB — KAPPA/LAMBDA LIGHT CHAINS: Kappa free light chain: 1.63 mg/dL (ref 0.33–1.94)

## 2009-06-04 LAB — BETA 2 MICROGLOBULIN, SERUM: Beta-2 Microglobulin: 2.31 mg/L — ABNORMAL HIGH (ref 1.01–1.73)

## 2009-06-14 ENCOUNTER — Telehealth: Payer: Self-pay | Admitting: Internal Medicine

## 2009-06-18 ENCOUNTER — Telehealth: Payer: Self-pay | Admitting: Gastroenterology

## 2009-06-26 ENCOUNTER — Ambulatory Visit: Payer: Self-pay | Admitting: Hematology & Oncology

## 2009-06-28 LAB — COMPREHENSIVE METABOLIC PANEL
AST: 24 U/L (ref 0–37)
Albumin: 4.3 g/dL (ref 3.5–5.2)
Alkaline Phosphatase: 56 U/L (ref 39–117)
BUN: 19 mg/dL (ref 6–23)
Creatinine, Ser: 1.56 mg/dL — ABNORMAL HIGH (ref 0.40–1.50)
Glucose, Bld: 212 mg/dL — ABNORMAL HIGH (ref 70–99)
Total Bilirubin: 0.4 mg/dL (ref 0.3–1.2)

## 2009-06-28 LAB — CBC WITH DIFFERENTIAL (CANCER CENTER ONLY)
BASO%: 0.9 % (ref 0.0–2.0)
EOS%: 3.5 % (ref 0.0–7.0)
HCT: 36.9 % — ABNORMAL LOW (ref 38.7–49.9)
LYMPH%: 30.5 % (ref 14.0–48.0)
MCHC: 32.5 g/dL (ref 32.0–35.9)
MCV: 75 fL — ABNORMAL LOW (ref 82–98)
MONO#: 0.8 10*3/uL (ref 0.1–0.9)
NEUT%: 56.8 % (ref 40.0–80.0)
Platelets: 297 10*3/uL (ref 145–400)
RDW: 15.4 % — ABNORMAL HIGH (ref 10.5–14.6)
WBC: 9.2 10*3/uL (ref 4.0–10.0)

## 2009-06-28 LAB — CHCC SATELLITE - SMEAR

## 2009-08-07 ENCOUNTER — Ambulatory Visit: Payer: Self-pay | Admitting: Hematology & Oncology

## 2009-08-08 LAB — CBC WITH DIFFERENTIAL (CANCER CENTER ONLY)
BASO%: 0.6 % (ref 0.0–2.0)
EOS%: 3.4 % (ref 0.0–7.0)
LYMPH#: 2.7 10*3/uL (ref 0.9–3.3)
MCHC: 33 g/dL (ref 32.0–35.9)
MONO#: 0.7 10*3/uL (ref 0.1–0.9)
NEUT#: 3.6 10*3/uL (ref 1.5–6.5)
NEUT%: 49.4 % (ref 40.0–80.0)
Platelets: 265 10*3/uL (ref 145–400)
RDW: 17.3 % — ABNORMAL HIGH (ref 10.5–14.6)
WBC: 7.3 10*3/uL (ref 4.0–10.0)

## 2009-08-13 LAB — SPEP & IFE WITH QIG
Albumin ELP: 51.5 % — ABNORMAL LOW (ref 55.8–66.1)
Beta 2: 4.6 % (ref 3.2–6.5)
Gamma Globulin: 19.3 % — ABNORMAL HIGH (ref 11.1–18.8)
IgA: 337 mg/dL (ref 68–378)
IgM, Serum: 66 mg/dL (ref 60–263)

## 2009-08-13 LAB — COMPREHENSIVE METABOLIC PANEL
ALT: 15 U/L (ref 0–53)
AST: 22 U/L (ref 0–37)
Calcium: 8.8 mg/dL (ref 8.4–10.5)
Chloride: 96 mEq/L (ref 96–112)
Creatinine, Ser: 1.67 mg/dL — ABNORMAL HIGH (ref 0.40–1.50)
Potassium: 3.7 mEq/L (ref 3.5–5.3)
Sodium: 136 mEq/L (ref 135–145)
Total Protein: 7.7 g/dL (ref 6.0–8.3)

## 2009-08-13 LAB — BETA 2 MICROGLOBULIN, SERUM: Beta-2 Microglobulin: 2.64 mg/L — ABNORMAL HIGH (ref 1.01–1.73)

## 2009-08-13 LAB — KAPPA/LAMBDA LIGHT CHAINS: Kappa:Lambda Ratio: 1.42 (ref 0.26–1.65)

## 2009-09-12 ENCOUNTER — Ambulatory Visit: Payer: Self-pay | Admitting: Hematology & Oncology

## 2009-09-16 ENCOUNTER — Emergency Department (HOSPITAL_COMMUNITY): Admission: EM | Admit: 2009-09-16 | Discharge: 2009-09-16 | Payer: Self-pay | Admitting: Emergency Medicine

## 2009-09-19 LAB — CBC WITH DIFFERENTIAL (CANCER CENTER ONLY)
BASO#: 0.1 10*3/uL (ref 0.0–0.2)
EOS%: 3.5 % (ref 0.0–7.0)
Eosinophils Absolute: 0.3 10*3/uL (ref 0.0–0.5)
HCT: 33.4 % — ABNORMAL LOW (ref 38.7–49.9)
HGB: 11.2 g/dL — ABNORMAL LOW (ref 13.0–17.1)
LYMPH#: 3 10*3/uL (ref 0.9–3.3)
MCHC: 33.4 g/dL (ref 32.0–35.9)
MONO#: 0.7 10*3/uL (ref 0.1–0.9)
NEUT#: 4.1 10*3/uL (ref 1.5–6.5)
NEUT%: 50.6 % (ref 40.0–80.0)
RBC: 4.34 10*6/uL (ref 4.20–5.70)
WBC: 8.2 10*3/uL (ref 4.0–10.0)

## 2009-09-23 LAB — BETA 2 MICROGLOBULIN, SERUM: Beta-2 Microglobulin: 2.73 mg/L — ABNORMAL HIGH (ref 1.01–1.73)

## 2009-09-23 LAB — COMPREHENSIVE METABOLIC PANEL
AST: 16 U/L (ref 0–37)
Albumin: 4.5 g/dL (ref 3.5–5.2)
BUN: 18 mg/dL (ref 6–23)
CO2: 22 mEq/L (ref 19–32)
Calcium: 9.7 mg/dL (ref 8.4–10.5)
Chloride: 100 mEq/L (ref 96–112)
Potassium: 3.8 mEq/L (ref 3.5–5.3)

## 2009-09-23 LAB — TESTOSTERONE: Testosterone: 1434.69 ng/dL — ABNORMAL HIGH (ref 350–890)

## 2009-09-23 LAB — SPEP & IFE WITH QIG
Beta 2: 4.3 % (ref 3.2–6.5)
Gamma Globulin: 18.6 % (ref 11.1–18.8)
IgA: 354 mg/dL (ref 68–378)
IgM, Serum: 74 mg/dL (ref 60–263)
M-Spike, %: 0.42 g/dL

## 2009-09-23 LAB — KAPPA/LAMBDA LIGHT CHAINS
Kappa free light chain: 0.89 mg/dL (ref 0.33–1.94)
Kappa:Lambda Ratio: 1.39 (ref 0.26–1.65)

## 2009-10-16 ENCOUNTER — Ambulatory Visit: Payer: Self-pay | Admitting: Hematology & Oncology

## 2009-10-17 LAB — CBC WITH DIFFERENTIAL (CANCER CENTER ONLY)
BASO#: 0 10*3/uL (ref 0.0–0.2)
BASO%: 0.6 % (ref 0.0–2.0)
HCT: 34.4 % — ABNORMAL LOW (ref 38.7–49.9)
HGB: 11.5 g/dL — ABNORMAL LOW (ref 13.0–17.1)
LYMPH#: 2.7 10*3/uL (ref 0.9–3.3)
LYMPH%: 36.4 % (ref 14.0–48.0)
MCHC: 33.3 g/dL (ref 32.0–35.9)
MCV: 78 fL — ABNORMAL LOW (ref 82–98)
MONO#: 0.7 10*3/uL (ref 0.1–0.9)
NEUT%: 51.4 % (ref 40.0–80.0)
RDW: 12.7 % (ref 10.5–14.6)
WBC: 7.3 10*3/uL (ref 4.0–10.0)

## 2009-10-17 LAB — CMP (CANCER CENTER ONLY)
BUN, Bld: 21 mg/dL (ref 7–22)
CO2: 29 mEq/L (ref 18–33)
Calcium: 9.2 mg/dL (ref 8.0–10.3)
Chloride: 96 mEq/L — ABNORMAL LOW (ref 98–108)
Creat: 2.1 mg/dl — ABNORMAL HIGH (ref 0.6–1.2)
Total Bilirubin: 0.5 mg/dl (ref 0.20–1.60)

## 2009-10-17 LAB — TECHNOLOGIST REVIEW CHCC SATELLITE

## 2009-10-21 LAB — PROTEIN ELECTROPHORESIS, SERUM
Albumin ELP: 51.8 % — ABNORMAL LOW (ref 55.8–66.1)
Alpha-1-Globulin: 7.3 % — ABNORMAL HIGH (ref 2.9–4.9)
Total Protein, Serum Electrophoresis: 8.1 g/dL (ref 6.0–8.3)

## 2009-10-21 LAB — FERRITIN: Ferritin: 260 ng/mL (ref 22–322)

## 2009-12-11 ENCOUNTER — Ambulatory Visit: Payer: Self-pay | Admitting: Hematology & Oncology

## 2009-12-12 LAB — MANUAL DIFFERENTIAL (CHCC SATELLITE)
BASO: 1 % (ref 0–2)
Eos: 2 % (ref 0–7)
LYMPH: 30 % (ref 14–48)
SEG: 61 % (ref 40–75)

## 2009-12-12 LAB — CBC WITH DIFFERENTIAL (CANCER CENTER ONLY)
HCT: 32.1 % — ABNORMAL LOW (ref 38.7–49.9)
HGB: 10.7 g/dL — ABNORMAL LOW (ref 13.0–17.1)
MCH: 25.9 pg — ABNORMAL LOW (ref 28.0–33.4)
MCHC: 33.2 g/dL (ref 32.0–35.9)
MCV: 78 fL — ABNORMAL LOW (ref 82–98)
Platelets: 257 10*3/uL (ref 145–400)
RBC: 4.12 10*6/uL — ABNORMAL LOW (ref 4.20–5.70)
RDW: 14.2 % (ref 10.5–14.6)
WBC: 8 10*3/uL (ref 4.0–10.0)

## 2009-12-16 LAB — SPEP & IFE WITH QIG
Alpha-2-Globulin: 14 % — ABNORMAL HIGH (ref 7.1–11.8)
Beta Globulin: 5.3 % (ref 4.7–7.2)
Gamma Globulin: 19.4 % — ABNORMAL HIGH (ref 11.1–18.8)
IgG (Immunoglobin G), Serum: 1570 mg/dL (ref 694–1618)
Total Protein, Serum Electrophoresis: 7.6 g/dL (ref 6.0–8.3)

## 2009-12-16 LAB — COMPREHENSIVE METABOLIC PANEL
Alkaline Phosphatase: 46 U/L (ref 39–117)
BUN: 35 mg/dL — ABNORMAL HIGH (ref 6–23)
Creatinine, Ser: 2.15 mg/dL — ABNORMAL HIGH (ref 0.40–1.50)
Glucose, Bld: 112 mg/dL — ABNORMAL HIGH (ref 70–99)
Total Bilirubin: 0.4 mg/dL (ref 0.3–1.2)

## 2009-12-16 LAB — BETA 2 MICROGLOBULIN, SERUM: Beta-2 Microglobulin: 3.87 mg/L — ABNORMAL HIGH (ref 1.01–1.73)

## 2009-12-16 LAB — KAPPA/LAMBDA LIGHT CHAINS
Kappa free light chain: 0.99 mg/dL (ref 0.33–1.94)
Kappa:Lambda Ratio: 1 (ref 0.26–1.65)
Lambda Free Lght Chn: 0.99 mg/dL (ref 0.57–2.63)

## 2009-12-18 ENCOUNTER — Ambulatory Visit (HOSPITAL_BASED_OUTPATIENT_CLINIC_OR_DEPARTMENT_OTHER): Admission: RE | Admit: 2009-12-18 | Discharge: 2009-12-18 | Payer: Self-pay | Admitting: Hematology & Oncology

## 2009-12-18 ENCOUNTER — Ambulatory Visit: Payer: Self-pay | Admitting: Diagnostic Radiology

## 2010-01-08 LAB — CBC WITH DIFFERENTIAL (CANCER CENTER ONLY)
BASO%: 0.6 % (ref 0.0–2.0)
Eosinophils Absolute: 0.2 10*3/uL (ref 0.0–0.5)
LYMPH#: 2.4 10*3/uL (ref 0.9–3.3)
LYMPH%: 38.8 % (ref 14.0–48.0)
MCV: 79 fL — ABNORMAL LOW (ref 82–98)
MONO#: 0.6 10*3/uL (ref 0.1–0.9)
NEUT#: 3.1 10*3/uL (ref 1.5–6.5)
Platelets: 250 10*3/uL (ref 145–400)
RBC: 4.47 10*6/uL (ref 4.20–5.70)
RDW: 13.2 % (ref 10.5–14.6)
WBC: 6.3 10*3/uL (ref 4.0–10.0)

## 2010-01-08 LAB — CMP (CANCER CENTER ONLY)
ALT(SGPT): 24 U/L (ref 10–47)
Albumin: 4.4 g/dL (ref 3.3–5.5)
CO2: 32 mEq/L (ref 18–33)
Calcium: 9.6 mg/dL (ref 8.0–10.3)
Chloride: 95 mEq/L — ABNORMAL LOW (ref 98–108)
Glucose, Bld: 75 mg/dL (ref 73–118)
Potassium: 4 mEq/L (ref 3.3–4.7)
Sodium: 141 mEq/L (ref 128–145)
Total Bilirubin: 0.3 mg/dl (ref 0.20–1.60)
Total Protein: 8.6 g/dL — ABNORMAL HIGH (ref 6.4–8.1)

## 2010-01-13 LAB — SPEP & IFE WITH QIG
Alpha-1-Globulin: 3.5 % (ref 2.9–4.9)
Alpha-2-Globulin: 13.1 % — ABNORMAL HIGH (ref 7.1–11.8)
Gamma Globulin: 20.3 % — ABNORMAL HIGH (ref 11.1–18.8)
IgM, Serum: 69 mg/dL (ref 60–263)
Total Protein, Serum Electrophoresis: 8.3 g/dL (ref 6.0–8.3)

## 2010-01-13 LAB — KAPPA/LAMBDA LIGHT CHAINS: Kappa free light chain: 1.02 mg/dL (ref 0.33–1.94)

## 2010-01-13 LAB — FERRITIN: Ferritin: 334 ng/mL — ABNORMAL HIGH (ref 22–322)

## 2010-01-13 LAB — BETA 2 MICROGLOBULIN, SERUM: Beta-2 Microglobulin: 3.13 mg/L — ABNORMAL HIGH (ref 1.01–1.73)

## 2010-01-17 ENCOUNTER — Ambulatory Visit (HOSPITAL_COMMUNITY)
Admission: RE | Admit: 2010-01-17 | Discharge: 2010-01-17 | Payer: Self-pay | Source: Home / Self Care | Admitting: Hematology & Oncology

## 2010-02-07 ENCOUNTER — Ambulatory Visit: Payer: Self-pay | Admitting: Hematology & Oncology

## 2010-02-10 LAB — CBC WITH DIFFERENTIAL (CANCER CENTER ONLY)
Eosinophils Absolute: 0.3 10*3/uL (ref 0.0–0.5)
HGB: 11 g/dL — ABNORMAL LOW (ref 13.0–17.1)
LYMPH#: 2.7 10*3/uL (ref 0.9–3.3)
MCH: 25.3 pg — ABNORMAL LOW (ref 28.0–33.4)
MONO%: 8.5 % (ref 0.0–13.0)
NEUT#: 3.8 10*3/uL (ref 1.5–6.5)
Platelets: 231 10*3/uL (ref 145–400)
RBC: 4.34 10*6/uL (ref 4.20–5.70)
WBC: 7.5 10*3/uL (ref 4.0–10.0)

## 2010-02-12 LAB — COMPREHENSIVE METABOLIC PANEL
AST: 21 U/L (ref 0–37)
Albumin: 4.4 g/dL (ref 3.5–5.2)
Alkaline Phosphatase: 52 U/L (ref 39–117)
BUN: 24 mg/dL — ABNORMAL HIGH (ref 6–23)
Glucose, Bld: 141 mg/dL — ABNORMAL HIGH (ref 70–99)
Potassium: 3.6 mEq/L (ref 3.5–5.3)
Sodium: 138 mEq/L (ref 135–145)
Total Bilirubin: 0.3 mg/dL (ref 0.3–1.2)

## 2010-02-12 LAB — SPEP & IFE WITH QIG
Beta 2: 5.6 % (ref 3.2–6.5)
Beta Globulin: 5.3 % (ref 4.7–7.2)
IgA: 362 mg/dL (ref 68–378)
IgG (Immunoglobin G), Serum: 1910 mg/dL — ABNORMAL HIGH (ref 694–1618)
IgM, Serum: 85 mg/dL (ref 60–263)
Total Protein, Serum Electrophoresis: 7.8 g/dL (ref 6.0–8.3)

## 2010-02-12 LAB — BETA 2 MICROGLOBULIN, SERUM: Beta-2 Microglobulin: 3.21 mg/L — ABNORMAL HIGH (ref 1.01–1.73)

## 2010-02-12 LAB — KAPPA/LAMBDA LIGHT CHAINS
Kappa free light chain: 1.84 mg/dL (ref 0.33–1.94)
Lambda Free Lght Chn: 1.1 mg/dL (ref 0.57–2.63)

## 2010-02-14 LAB — UIFE/LIGHT CHAINS/TP QN, 24-HR UR
Albumin, U: DETECTED
Alpha 1, Urine: DETECTED — AB
Alpha 2, Urine: DETECTED — AB
Free Kappa Lt Chains,Ur: 10.7 mg/dL — ABNORMAL HIGH (ref 0.04–1.51)
Free Kappa/Lambda Ratio: 15.74 ratio — ABNORMAL HIGH (ref 0.46–4.00)
Free Lambda Excretion/Day: 11.9 mg/d
Time: 24 hours
Total Protein, Urine-Ur/day: 210 mg/d — ABNORMAL HIGH (ref 10–140)
Total Protein, Urine: 12 mg/dL

## 2010-03-03 LAB — CBC WITH DIFFERENTIAL (CANCER CENTER ONLY)
BASO#: 0 10*3/uL (ref 0.0–0.2)
BASO%: 0.5 % (ref 0.0–2.0)
EOS%: 3.6 % (ref 0.0–7.0)
Eosinophils Absolute: 0.3 10*3/uL (ref 0.0–0.5)
HCT: 31.2 % — ABNORMAL LOW (ref 38.7–49.9)
HGB: 10.2 g/dL — ABNORMAL LOW (ref 13.0–17.1)
LYMPH#: 2 10*3/uL (ref 0.9–3.3)
LYMPH%: 29 % (ref 14.0–48.0)
MCH: 25.3 pg — ABNORMAL LOW (ref 28.0–33.4)
MCHC: 32.7 g/dL (ref 32.0–35.9)
MCV: 77 fL — ABNORMAL LOW (ref 82–98)
MONO#: 0.7 10*3/uL (ref 0.1–0.9)
MONO%: 9.3 % (ref 0.0–13.0)
NEUT#: 4.1 10*3/uL (ref 1.5–6.5)
NEUT%: 57.6 % (ref 40.0–80.0)
Platelets: 294 10*3/uL (ref 145–400)
RBC: 4.04 10*6/uL — ABNORMAL LOW (ref 4.20–5.70)
RDW: 13.9 % (ref 10.5–14.6)
WBC: 7 10*3/uL (ref 4.0–10.0)

## 2010-03-07 LAB — UIFE/LIGHT CHAINS/TP QN, 24-HR UR
Albumin, U: DETECTED
Alpha 1, Urine: DETECTED — AB
Alpha 2, Urine: DETECTED — AB
Beta, Urine: DETECTED — AB
Free Kappa Lt Chains,Ur: 11.5 mg/dL — ABNORMAL HIGH (ref 0.04–1.51)
Free Kappa/Lambda Ratio: 7.42 ratio — ABNORMAL HIGH (ref 0.46–4.00)
Free Lambda Excretion/Day: 20.93 mg/d
Free Lambda Lt Chains,Ur: 1.55 mg/dL — ABNORMAL HIGH (ref 0.08–1.01)
Free Lt Chn Excr Rate: 155.25 mg/d
Time: 24 hours
Total Protein, Urine-Ur/day: 186 mg/d — ABNORMAL HIGH (ref 10–140)
Total Protein, Urine: 13.8 mg/dL
Volume, Urine: 1350 mL

## 2010-03-07 LAB — COMPREHENSIVE METABOLIC PANEL
ALT: 24 U/L (ref 0–53)
AST: 19 U/L (ref 0–37)
Albumin: 4.6 g/dL (ref 3.5–5.2)
Alkaline Phosphatase: 53 U/L (ref 39–117)
BUN: 31 mg/dL — ABNORMAL HIGH (ref 6–23)
CO2: 28 mEq/L (ref 19–32)
Calcium: 9.5 mg/dL (ref 8.4–10.5)
Chloride: 99 mEq/L (ref 96–112)
Creatinine, Ser: 2.44 mg/dL — ABNORMAL HIGH (ref 0.40–1.50)
Glucose, Bld: 68 mg/dL — ABNORMAL LOW (ref 70–99)
Potassium: 3.3 mEq/L — ABNORMAL LOW (ref 3.5–5.3)
Sodium: 139 mEq/L (ref 135–145)
Total Bilirubin: 0.3 mg/dL (ref 0.3–1.2)
Total Protein: 8.1 g/dL (ref 6.0–8.3)

## 2010-03-07 LAB — SPEP & IFE WITH QIG
Albumin ELP: 53.2 % — ABNORMAL LOW (ref 55.8–66.1)
Alpha-1-Globulin: 3.6 % (ref 2.9–4.9)
Alpha-2-Globulin: 13.9 % — ABNORMAL HIGH (ref 7.1–11.8)
Beta 2: 5.9 % (ref 3.2–6.5)
Beta Globulin: 5.3 % (ref 4.7–7.2)
Gamma Globulin: 18.1 % (ref 11.1–18.8)
IgA: 318 mg/dL (ref 68–378)
IgG (Immunoglobin G), Serum: 1600 mg/dL (ref 694–1618)
IgM, Serum: 68 mg/dL (ref 60–263)
M-Spike, %: 0.54 g/dL
Total Protein, Serum Electrophoresis: 8.1 g/dL (ref 6.0–8.3)

## 2010-03-07 LAB — LACTATE DEHYDROGENASE: LDH: 161 U/L (ref 94–250)

## 2010-03-07 LAB — KAPPA/LAMBDA LIGHT CHAINS
Kappa free light chain: 1.22 mg/dL (ref 0.33–1.94)
Kappa:Lambda Ratio: 1.17 (ref 0.26–1.65)
Lambda Free Lght Chn: 1.04 mg/dL (ref 0.57–2.63)

## 2010-03-07 LAB — FERRITIN: Ferritin: 500 ng/mL — ABNORMAL HIGH (ref 22–322)

## 2010-03-10 ENCOUNTER — Ambulatory Visit: Payer: Self-pay | Admitting: Hematology & Oncology

## 2010-03-10 LAB — CBC WITH DIFFERENTIAL (CANCER CENTER ONLY)
BASO#: 0 10*3/uL (ref 0.0–0.2)
BASO%: 0.5 % (ref 0.0–2.0)
EOS%: 3.3 % (ref 0.0–7.0)
Eosinophils Absolute: 0.2 10*3/uL (ref 0.0–0.5)
HCT: 28.7 % — ABNORMAL LOW (ref 38.7–49.9)
HGB: 9.5 g/dL — ABNORMAL LOW (ref 13.0–17.1)
LYMPH#: 1.7 10*3/uL (ref 0.9–3.3)
LYMPH%: 26.7 % (ref 14.0–48.0)
MCH: 25.3 pg — ABNORMAL LOW (ref 28.0–33.4)
MCHC: 33 g/dL (ref 32.0–35.9)
MCV: 77 fL — ABNORMAL LOW (ref 82–98)
MONO#: 0.6 10*3/uL (ref 0.1–0.9)
MONO%: 8.5 % (ref 0.0–13.0)
NEUT#: 4 10*3/uL (ref 1.5–6.5)
NEUT%: 61 % (ref 40.0–80.0)
Platelets: 251 10*3/uL (ref 145–400)
RBC: 3.75 10*6/uL — ABNORMAL LOW (ref 4.20–5.70)
RDW: 13.7 % (ref 10.5–14.6)
WBC: 6.5 10*3/uL (ref 4.0–10.0)

## 2010-03-16 ENCOUNTER — Encounter: Payer: Self-pay | Admitting: Hematology & Oncology

## 2010-03-24 ENCOUNTER — Other Ambulatory Visit: Payer: Self-pay | Admitting: Hematology & Oncology

## 2010-03-24 ENCOUNTER — Emergency Department (HOSPITAL_COMMUNITY)
Admission: EM | Admit: 2010-03-24 | Discharge: 2010-03-25 | Payer: Self-pay | Source: Home / Self Care | Admitting: Emergency Medicine

## 2010-03-24 DIAGNOSIS — C9000 Multiple myeloma not having achieved remission: Secondary | ICD-10-CM

## 2010-03-24 DIAGNOSIS — M545 Low back pain: Secondary | ICD-10-CM

## 2010-03-24 LAB — COMPREHENSIVE METABOLIC PANEL
ALT: 18 U/L (ref 0–53)
AST: 20 U/L (ref 0–37)
Albumin: 3.9 g/dL (ref 3.5–5.2)
Alkaline Phosphatase: 44 U/L (ref 39–117)
GFR calc Af Amer: 31 mL/min — ABNORMAL LOW (ref >60)
Potassium: 3.5 mEq/L (ref 3.5–5.1)
Sodium: 139 mEq/L (ref 135–145)
Total Protein: 7.3 g/dL (ref 6.0–8.3)

## 2010-03-24 LAB — CBC WITH DIFFERENTIAL (CANCER CENTER ONLY)
BASO#: 0 10*3/uL (ref 0.0–0.2)
Eosinophils Absolute: 0.3 10*3/uL (ref 0.0–0.5)
HGB: 9.9 g/dL — ABNORMAL LOW (ref 13.0–17.1)
LYMPH%: 28.1 % (ref 14.0–48.0)
MCV: 76 fL — ABNORMAL LOW (ref 82–98)
MONO#: 0.7 10*3/uL (ref 0.1–0.9)
NEUT#: 3.2 10*3/uL (ref 1.5–6.5)
Platelets: 275 10*3/uL (ref 145–400)
RBC: 3.84 10*6/uL — ABNORMAL LOW (ref 4.20–5.70)
WBC: 5.8 10*3/uL (ref 4.0–10.0)

## 2010-03-24 LAB — POCT I-STAT, CHEM 8
Creatinine, Ser: 3.1 mg/dL — ABNORMAL HIGH (ref 0.4–1.5)
HCT: 31 % — ABNORMAL LOW (ref 39.0–52.0)
Hemoglobin: 10.5 g/dL — ABNORMAL LOW (ref 13.0–17.0)
Potassium: 3.3 mEq/L — ABNORMAL LOW (ref 3.5–5.1)
Sodium: 141 mEq/L (ref 135–145)

## 2010-03-24 LAB — GLUCOSE, CAPILLARY

## 2010-03-25 ENCOUNTER — Inpatient Hospital Stay (HOSPITAL_COMMUNITY)
Admission: AD | Admit: 2010-03-25 | Discharge: 2010-03-29 | DRG: 149 | Disposition: A | Discharge status: Home / Self Care | Payer: Medicaid Other | Attending: Internal Medicine | Admitting: Internal Medicine

## 2010-03-25 DIAGNOSIS — I129 Hypertensive chronic kidney disease with stage 1 through stage 4 chronic kidney disease, or unspecified chronic kidney disease: Secondary | ICD-10-CM | POA: Diagnosis present

## 2010-03-25 DIAGNOSIS — R42 Dizziness and giddiness: Principal | ICD-10-CM | POA: Diagnosis present

## 2010-03-25 DIAGNOSIS — E1142 Type 2 diabetes mellitus with diabetic polyneuropathy: Secondary | ICD-10-CM | POA: Diagnosis present

## 2010-03-25 DIAGNOSIS — G8929 Other chronic pain: Secondary | ICD-10-CM | POA: Diagnosis present

## 2010-03-25 DIAGNOSIS — N183 Chronic kidney disease, stage 3 unspecified: Secondary | ICD-10-CM | POA: Diagnosis present

## 2010-03-25 DIAGNOSIS — M545 Low back pain, unspecified: Secondary | ICD-10-CM | POA: Diagnosis present

## 2010-03-25 DIAGNOSIS — E860 Dehydration: Secondary | ICD-10-CM | POA: Diagnosis present

## 2010-03-25 DIAGNOSIS — N179 Acute kidney failure, unspecified: Secondary | ICD-10-CM | POA: Diagnosis present

## 2010-03-25 DIAGNOSIS — E1149 Type 2 diabetes mellitus with other diabetic neurological complication: Secondary | ICD-10-CM | POA: Diagnosis present

## 2010-03-25 DIAGNOSIS — D509 Iron deficiency anemia, unspecified: Secondary | ICD-10-CM | POA: Diagnosis present

## 2010-03-25 DIAGNOSIS — C9000 Multiple myeloma not having achieved remission: Secondary | ICD-10-CM | POA: Diagnosis present

## 2010-03-25 DIAGNOSIS — R0601 Orthopnea: Secondary | ICD-10-CM | POA: Diagnosis present

## 2010-03-25 DIAGNOSIS — Z9221 Personal history of antineoplastic chemotherapy: Secondary | ICD-10-CM

## 2010-03-25 HISTORY — DX: Disorder of kidney and ureter, unspecified: N28.9

## 2010-03-25 HISTORY — DX: Essential (primary) hypertension: I10

## 2010-03-25 LAB — COMPREHENSIVE METABOLIC PANEL
ALT: 15 U/L (ref 0–53)
AST: 19 U/L (ref 0–37)
Calcium: 9 mg/dL (ref 8.4–10.5)
GFR calc Af Amer: 33 mL/min — ABNORMAL LOW (ref >60)
Glucose, Bld: 105 mg/dL — ABNORMAL HIGH (ref 70–99)
Sodium: 141 mEq/L (ref 135–145)
Total Protein: 7 g/dL (ref 6.0–8.3)

## 2010-03-25 LAB — DIFFERENTIAL
Basophils Absolute: 0 10*3/uL (ref 0.0–0.1)
Basophils Relative: 1 % (ref 0–1)
Eosinophils Absolute: 0.1 10*3/uL (ref 0.0–0.7)
Eosinophils Relative: 1 % (ref 0–5)
Monocytes Absolute: 0.7 10*3/uL (ref 0.1–1.0)

## 2010-03-25 LAB — CBC
MCHC: 33.5 g/dL (ref 30.0–36.0)
Platelets: 216 10*3/uL (ref 150–400)
RDW: 15.9 % — ABNORMAL HIGH (ref 11.5–15.5)

## 2010-03-25 LAB — BRAIN NATRIURETIC PEPTIDE: Pro B Natriuretic peptide (BNP): 35.8 pg/mL (ref 0.0–100.0)

## 2010-03-25 LAB — GLUCOSE, CAPILLARY: Glucose-Capillary: 140 mg/dL — ABNORMAL HIGH (ref 70–99)

## 2010-03-25 LAB — URINALYSIS, ROUTINE W REFLEX MICROSCOPIC
Bilirubin Urine: NEGATIVE
Nitrite: NEGATIVE
Specific Gravity, Urine: 1.014 (ref 1.005–1.030)
pH: 6 (ref 5.0–8.0)

## 2010-03-25 LAB — SODIUM, URINE, RANDOM: Sodium, Ur: 113 mEq/L

## 2010-03-26 ENCOUNTER — Encounter (HOSPITAL_COMMUNITY): Payer: Self-pay | Admitting: Internal Medicine

## 2010-03-26 ENCOUNTER — Encounter (HOSPITAL_COMMUNITY): Payer: Self-pay

## 2010-03-26 ENCOUNTER — Ambulatory Visit (HOSPITAL_COMMUNITY)
Admission: RE | Admit: 2010-03-26 | Discharge: 2010-03-26 | Disposition: A | Discharge status: Home / Self Care | Payer: Medicaid Other | Source: Ambulatory Visit | Attending: Internal Medicine | Admitting: Internal Medicine

## 2010-03-26 ENCOUNTER — Inpatient Hospital Stay (HOSPITAL_COMMUNITY): Payer: Medicaid Other

## 2010-03-26 ENCOUNTER — Other Ambulatory Visit (HOSPITAL_COMMUNITY): Payer: Self-pay | Admitting: Internal Medicine

## 2010-03-26 DIAGNOSIS — R42 Dizziness and giddiness: Secondary | ICD-10-CM

## 2010-03-26 DIAGNOSIS — N179 Acute kidney failure, unspecified: Secondary | ICD-10-CM

## 2010-03-26 LAB — GLUCOSE, CAPILLARY
Glucose-Capillary: 151 mg/dL — ABNORMAL HIGH (ref 70–99)
Glucose-Capillary: 154 mg/dL — ABNORMAL HIGH (ref 70–99)
Glucose-Capillary: 130 mg/dL — ABNORMAL HIGH (ref 70–99)

## 2010-03-26 LAB — CBC
MCH: 25.6 pg — ABNORMAL LOW (ref 26.0–34.0)
MCHC: 33.5 g/dL (ref 30.0–36.0)
MCV: 76.5 fL — ABNORMAL LOW (ref 78.0–100.0)
Platelets: 204 10*3/uL (ref 150–400)
RDW: 16.1 % — ABNORMAL HIGH (ref 11.5–15.5)
WBC: 4.7 10*3/uL (ref 4.0–10.5)

## 2010-03-26 LAB — BASIC METABOLIC PANEL
BUN: 17 mg/dL (ref 6–23)
Calcium: 9 mg/dL (ref 8.4–10.5)
Creatinine, Ser: 2.28 mg/dL — ABNORMAL HIGH (ref 0.4–1.5)
GFR calc non Af Amer: 29 mL/min — ABNORMAL LOW (ref >60)
Potassium: 3.9 mEq/L (ref 3.5–5.1)

## 2010-03-26 LAB — HEMOGLOBIN A1C: Mean Plasma Glucose: 171 mg/dL — ABNORMAL HIGH (ref <117)

## 2010-03-27 ENCOUNTER — Other Ambulatory Visit (HOSPITAL_COMMUNITY): Payer: Self-pay

## 2010-03-27 LAB — SPEP & IFE WITH QIG
Alpha-1-Globulin: 3.8 % (ref 2.9–4.9)
Alpha-2-Globulin: 14.7 % — ABNORMAL HIGH (ref 7.1–11.8)
Beta Globulin: 5.5 % (ref 4.7–7.2)
Gamma Globulin: 17.7 % (ref 11.1–18.8)
IgG (Immunoglobin G), Serum: 1390 mg/dL (ref 694–1618)
Total Protein, Serum Electrophoresis: 7.6 g/dL (ref 6.0–8.3)

## 2010-03-27 LAB — COMPREHENSIVE METABOLIC PANEL
Albumin: 4.5 g/dL (ref 3.5–5.2)
CO2: 27 mEq/L (ref 19–32)
Glucose, Bld: 101 mg/dL — ABNORMAL HIGH (ref 70–99)
Sodium: 137 mEq/L (ref 135–145)
Total Bilirubin: 0.3 mg/dL (ref 0.3–1.2)
Total Protein: 7.6 g/dL (ref 6.0–8.3)

## 2010-03-27 LAB — IRON AND TIBC
Saturation Ratios: 25 % (ref 20–55)
TIBC: 209 ug/dL — ABNORMAL LOW (ref 215–435)
UIBC: 156 ug/dL

## 2010-03-27 LAB — FERRITIN: Ferritin: 563 ng/mL — ABNORMAL HIGH (ref 22–322)

## 2010-03-27 LAB — BASIC METABOLIC PANEL
Calcium: 8.7 mg/dL (ref 8.4–10.5)
Creatinine, Ser: 1.95 mg/dL — ABNORMAL HIGH (ref 0.4–1.5)
GFR calc Af Amer: 43 mL/min — ABNORMAL LOW (ref >60)
GFR calc non Af Amer: 35 mL/min — ABNORMAL LOW (ref >60)
Glucose, Bld: 113 mg/dL — ABNORMAL HIGH (ref 70–99)
Sodium: 139 mEq/L (ref 135–145)

## 2010-03-27 LAB — KAPPA/LAMBDA LIGHT CHAINS
Kappa:Lambda Ratio: 1.75 — ABNORMAL HIGH (ref 0.26–1.65)
Lambda Free Lght Chn: 0.76 mg/dL (ref 0.57–2.63)

## 2010-03-27 LAB — GLUCOSE, CAPILLARY

## 2010-03-27 LAB — CBC
MCH: 25.5 pg — ABNORMAL LOW (ref 26.0–34.0)
MCHC: 33.3 g/dL (ref 30.0–36.0)
RDW: 16.2 % — ABNORMAL HIGH (ref 11.5–15.5)

## 2010-03-27 LAB — FOLATE: Folate: 14.2 ng/mL

## 2010-03-27 LAB — VITAMIN B12: Vitamin B-12: 778 pg/mL (ref 211–911)

## 2010-03-27 NOTE — Progress Notes (Signed)
Summary: triage   Phone Note Call from Patient Call back at Home Phone [phone]   Caller: Patient Call For: Fuller Plan Reason for Call: Talk to Nurse Summary of Call: Patient wants to sepak to nurse because he is having difficulty swallowing and was told on Friday that Dr Fuller Plan would call him yesterday. Patient want to know what to do. Initial call taken by: Ronalee Red,  June 18, 2009 10:14 AM  Follow-up for Phone Call        phone is busy I will continue to try and reach the patient  Barb Merino RN, CGRN  June 18, 2009 11:39 AM  Patient  states that levsin is not helping, he is trying one with each meal.  per Rx he can take 1-2 , I  have asked him to increase his Levsin to 2 before each meal. Dr Ardis Hughs  will you advise and let me know what we can do in Dr Lynne Leader absence. 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 Discontinue metoclopramide. Trial of Levsin 1-2 before meals. Consider trial of peppermint oil, diltiazem or nitrates if Levsin is not effective. Advised using warm to hot beverages with meals. Follow-up by: Barb Merino RN, [place],  June 18, 2009 3:11 PM  Additional Follow-up for Phone Call Additional follow up Details #1::        agree with doubling levsin first. Additional Follow-up by: Milus Banister MD,  June 18, 2009 3:14 PM    Additional Follow-up for Phone Call Additional follow up Details #2::    Patient advised of the above.  He also advised that pepermint oil could be tried before meals along with levsin.  patient aware, he will call back if the above doesn't help. Follow-up by: Barb Merino RN, [place],  June 18, 2009 3:18 PM

## 2010-03-27 NOTE — Procedures (Signed)
Summary: Esophageal Manometry/[facility]  Esophageal Manometry/[facility]   Imported By: Phillis Knack 05/08/2009 10:30:15  _____________________________________________________________________  External Attachment:    Type:   Image     Comment:   External Document

## 2010-03-27 NOTE — Procedures (Signed)
Summary: Esophageal Manometry Order/[facility]  Esophageal Manometry Order/[facility]   Imported By: Phillis Knack 05/08/2009 10:31:56  _____________________________________________________________________  External Attachment:    Type:   Image     Comment:   External Document

## 2010-03-27 NOTE — Progress Notes (Signed)
Summary: Test results   Phone Note Call from Patient Call back at Home Phone [phone]   Caller: Patient Call For: Dr. Fuller Plan Reason for Call: Lab or Test Results Summary of Call: Pt. is calling about Manometry results...he is still having problems Initial call taken by: Webb Laws,  May 15, 2009 10:38 AM  Follow-up for Phone Call        Patient was to schedule an office visit for after manometry.  I have scheduled him for office visit for 05-16-09 at 11:00 with Dr Fuller Plan  Follow-up by: Barb Merino RN, CGRN,  May 15, 2009 11:10 AM

## 2010-03-27 NOTE — Procedures (Signed)
Summary: Esophageal Manometry   Esophageal Manometry  Procedure date:  05/03/2009  Findings:      abnormal:     NAME:  [name], [name]              ACCOUNT NO.:  [id number]      MEDICAL RECORD NO.:  [id number]          PATIENT TYPE:  AMB      LOCATION:  ENDO                         FACILITY:  [facility]      PHYSICIAN:  Knight Oelkers T. Fuller Plan, MD, FACGDATE OF BIRTH:  18-Sep-1950      DATE OF CONSULTATION:  04/22/2009   DATE OF DISCHARGE:  04/22/2009                                    CONSULTATION      INDICATIONS:  Dysphagia with liquids and solids.      The procedure was performed without difficulty in [facility]   Endoscopy Unit.      Upper esophageal sphincter study, resting pressure 47.4 mmHg with   residual pressure of 2.4 mmHg, 94% relaxation.  Normal study.      Lower esophageal body study revealed normal peristalsis with 10/10   swallows, one port had a slightly elevated amplitude at 262 mmHg, the   other ports recorded normal pressures.      Lower esophageal sphincter study, it was an elevated lower esophageal   sphincter pressure at 57.3 mmHg.  Residual pressure was elevated at 12   mmHg with 79% relaxation.      IMPRESSION:  Hypertensive lower esophageal sphincter with an elevated   residual pressure.      RECOMMENDATIONS:  Return for followup office visit for further plans.               Pricilla Riffle. Fuller Plan, MD, [facility]            MTS/MEDQ  D:  05/02/2009  T:  05/03/2009  Job:  [id number]      Electronically Signed by Lucio Cleland MD FACG on 05/07/2009 07:58:22 PM

## 2010-03-27 NOTE — Progress Notes (Signed)
Summary: D/C metoclopramide   ---- Converted from flag ---- ---- 05/16/2009 1:08 PM, Ladene Artist MD [facility] wrote: discontinue metoclopramide. this can increase LES pressure. ------------------------------  Phone Note Outgoing Call Call back at Home Phone [phone]   Call placed by: Awilda Bill CMA Deborra Medina),  May 17, 2009 11:04 AM Call placed to: Patient Summary of Call: I have called patient and told him Dr Fuller Plan would like for him to discontinue taking his reglan/metaclopramide due to its possible side effect of incresaed LES pressure (which was seen on patient's recent manometry report). Patient verbalizes understanding and states that he will discontinue the reglan at this time. Initial call taken by: Awilda Bill CMA Deborra Medina),  May 17, 2009 11:07 AM     Appended Document: D/C metoclopramide    Clinical Lists Changes  Medications: Removed medication of REGLAN 10 MG TABS (METOCLOPRAMIDE HCL) one tablet by mouth four times a day

## 2010-03-27 NOTE — Progress Notes (Signed)
   Phone Note Call from Patient   Caller: Patient Reason for Call: Talk to Doctor Summary of Call: medication he was given by Dr Fuller Plan not working, still having trouble swallowing. He was given levsin SL.125 mg,  I aske him not to take it, to chew slowly and thoroughle and call Dr Lynne Leader nurse after the weekend to inquire about an alternative medication Initial call taken by: Lafayette Dragon MD,  June 14, 2009 3:40 PM

## 2010-03-27 NOTE — Assessment & Plan Note (Signed)
Summary: follow up manometry/[place]   History of Present Illness Visit Type: Follow-up Visit Primary GI MD: Joylene Igo MD [facility] Primary Provider: Barbette Merino, MD Chief Complaint: follow-up Manometry History of Present Illness:   [name] returns for followup of dysphagia. Barium esophagram showed transient hold up of a 13 mm tablet. Esophageal manometry revealed an elevated lower esophageal sphincter pressure with an elevated residual pressure. He has persistent problems with solid food dysphagia. His dysphagia did not improve with empiric Savary dilation.   GI Review of Systems    Reports acid reflux, chest pain, and  dysphagia with solids.      Denies abdominal pain, belching, bloating, dysphagia with liquids, heartburn, loss of appetite, nausea, vomiting, vomiting blood, weight loss, and  weight gain.      Reports constipation.     Denies anal fissure, black tarry stools, change in bowel habit, diarrhea, diverticulosis, fecal incontinence, heme positive stool, hemorrhoids, irritable bowel syndrome, jaundice, light color stool, liver problems, rectal bleeding, and  rectal pain.   Current Medications (verified): 1)  Reglan 10 Mg Tabs (Metoclopramide Hcl) .... One Tablet By Mouth Four Times A Day 2)  Glipizide 5 Mg Tabs (Glipizide) .... One Tablet By Mouth Once Daily 3)  Lisinopril-Hydrochlorothiazide 20-25 Mg Tabs (Lisinopril-Hydrochlorothiazide) .... One Tablet By Mouth Once Daily 4)  Flomax 0.4 Mg Xr24h-Cap (Tamsulosin Hcl) .... Once Daily 5)  Roxicodone 15 Mg Tabs (Oxycodone Hcl) .... One Tablet By Mouth Every 6 Hours As Needed 6)  Oxycontin 20 Mg Xr12h-Tab (Oxycodone Hcl) .... One Tablet By Mouth Every 12 Hours As Needed 7)  Ibuprofen 800 Mg Tabs (Ibuprofen) .... One Tablet By Mouth Three Times A Day 8)  Norvasc 10 Mg Tabs (Amlodipine Besylate) .Marland Kitchen.. 1 Tablet By Mouth Once Daily 9)  Maxzide-25 37.5-25 Mg Tabs (Triamterene-Hctz) .Marland Kitchen.. 1 Tablet By Mouth  Once Daily 10)  Klor-Con  M10 10 Meq Cr-Tabs (Potassium Chloride Crys Cr) .Marland Kitchen.. 1 By Mouth Once Daily 11)  Promethazine Hcl 25 Mg Tabs (Promethazine Hcl) .Marland Kitchen.. 1 Every 6 Hours As Needed 12)  Robaxin 500 Mg Tabs (Methocarbamol) .... Every 6 Hours As Needed 13)  Proventil Hfa 108 (90 Base) Mcg/act Aers (Albuterol Sulfate) .... Two Times A Day As Needed 14)  Omeprazole 20 Mg Cpdr (Omeprazole) .... One Tablet By Mouth Once Daily 15)  Oxycodone-Acetaminophen 10-650 Mg Tabs (Oxycodone-Acetaminophen) .... As Needed 16)  Tamsulosin Hcl 0.4 Mg Caps (Tamsulosin Hcl) .Marland Kitchen.. 1 By Mouth Once Daily 17)  Furosemide 20 Mg Tabs (Furosemide) .Marland Kitchen.. 1 By Mouth Once Daily 18)  Bisacodyl Ec 5 Mg Tbec (Bisacodyl) .... 2 By Mouth Two Times A Day 19)  Vitamin D 1000 Unit Tabs (Cholecalciferol) .Marland Kitchen.. 1 Pill Every 5 Days  Allergies (verified): 1)  ! Iodine  Past History:  Past Medical History: Diverticulosis Hemorrhoids Diabetes Mellitus type II Anxiety Disorder Hypertension Multiple Myeloma Urinary retention Anemia Diabetic neuropathy Constipation Acute gastroenteritis 02/2007 Herpes Zoster Arthritis Hyperlipidemia Urinary Tract Infection Hypertensive LES  Past Surgical History: Reviewed history from 03/09/2008 and no changes required. Knee Arthroscopy PORACATH PLACEMENT  Family History: Reviewed history from 03/09/2008 and no changes required. Family History of Breast Cancer: Mother, Sister Family History of Diabetes: sister No FH of Colon Cancer: Bone Cancer-father  Social History: Reviewed history from 03/09/2008 and no changes required. Divorced Patient has never smoked.  Alcohol Use - no Illicit Drug Use - no Patient does not get regular exercise.   Review of Systems       The patient  complains of sleeping problems and urination changes/pain.         The pertinent positives and negatives are noted as above and in the HPI. All other ROS were reviewed and were negative.   Vital Signs:  Patient profile:   60  year old male Height:      71 inches Weight:      202 pounds BMI:     28.28 Pulse rate:   108 / minute Pulse rhythm:   regular BP sitting:   112 / 68  (left arm)  Vitals Entered By: [name] NCMA (May 16, 2009 10:48 AM)  Physical Exam  General:  Well developed, well nourished, no acute distress. Head:  Normocephalic and atraumatic. Eyes:  PERRLA, no icterus. Mouth:  No deformity or lesions, dentition normal. Lungs:  Clear throughout to auscultation. Heart:  Regular rate and rhythm; no murmurs, rubs,  or bruits. Abdomen:  Soft, nontender and nondistended. No masses, hepatosplenomegaly or hernias noted. Normal bowel sounds. Psych:  Alert and cooperative. Normal mood and affect.  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 Discontinue metoclopramide. Trial of Levsin 1-2 before meals. Consider trial of peppermint oil, diltiazem or nitrates if Levsin is not effective. Advised using warm to hot beverages with meals.  Patient Instructions: 1)  Pick up your prescription from your pharmacy.  2)  Call us back to let us know how this medication works for you. 3)  Discontinue metoclopramide 4)  Copy sent to : Barbette Merino, MD 5)  The medication list was reviewed and reconciled.  All changed / newly prescribed medications were explained.  A complete medication list was provided to the patient / caregiver. 6)  Please schedule a follow-up appointment as needed.   Prescriptions: LEVSIN/SL 0.125 MG SUBL (HYOSCYAMINE SULFATE) 1-2 tablets by mouth or under tongue three times a day before meals  #180 x 11   Entered by:   Marlon Pel CMA ([place])   Authorized by:   Ladene Artist MD [facility]   Signed by:   Ladene Artist MD [facility] on 05/16/2009   Method used:   Electronically to        [facility] [phone]* (retail)       [address]       [place], [place]  [address]       Ph: [phone]        Fax: [phone]   RxID:   [id number]

## 2010-03-27 NOTE — Assessment & Plan Note (Signed)
Summary: choking, esp when laying down...as.   History of Present Illness Visit Type: Follow-up Visit Primary GI MD: Joylene Igo MD [facility] Primary Provider: Barbette Merino, MD Chief Complaint: Feeling of choking while lying down, SOB History of Present Illness:   This is a 60 year old male with multiple myeloma who has had severe and persistent problems with solid and liquid dysphagia. Recent upper endoscopy with and appeared separate dilation did not change his symptoms. Only mild gastritis was noted at endoscopy.   GI Review of Systems    Reports acid reflux, belching, bloating, chest pain, dysphagia with liquids, dysphagia with solids, heartburn, and  nausea.      Denies abdominal pain, loss of appetite, vomiting, vomiting blood, weight loss, and  weight gain.      Reports constipation.     Denies anal fissure, black tarry stools, change in bowel habit, diarrhea, diverticulosis, fecal incontinence, heme positive stool, hemorrhoids, irritable bowel syndrome, jaundice, light color stool, liver problems, rectal bleeding, and  rectal pain.   Current Medications (verified): 1)  Reglan 10 Mg Tabs (Metoclopramide Hcl) .... One Tablet By Mouth Four Times A Day 2)  Glipizide 5 Mg Tabs (Glipizide) .... One Tablet By Mouth Once Daily 3)  Lisinopril-Hydrochlorothiazide 20-25 Mg Tabs (Lisinopril-Hydrochlorothiazide) .... One Tablet By Mouth Once Daily 4)  Flomax 0.4 Mg Xr24h-Cap (Tamsulosin Hcl) .... Once Daily 5)  Roxicodone 15 Mg Tabs (Oxycodone Hcl) .... One Tablet By Mouth Every 6 Hours As Needed 6)  Oxycontin 20 Mg Xr12h-Tab (Oxycodone Hcl) .... One Tablet By Mouth Every 12 Hours As Needed 7)  Ibuprofen 800 Mg Tabs (Ibuprofen) .... One Tablet By Mouth Three Times A Day 8)  Norvasc 10 Mg Tabs (Amlodipine Besylate) .Marland Kitchen.. 1 Tablet By Mouth Once Daily 9)  Maxzide-25 37.5-25 Mg Tabs (Triamterene-Hctz) .Marland Kitchen.. 1 Tablet By Mouth  Once Daily 10)  Klor-Con M10 10 Meq Cr-Tabs (Potassium Chloride Crys Cr)  .Marland Kitchen.. 1 By Mouth Once Daily 11)  Promethazine Hcl 25 Mg Tabs (Promethazine Hcl) .Marland Kitchen.. 1 Every 6 Hours As Needed 12)  Robaxin 500 Mg Tabs (Methocarbamol) .... Every 6 Hours As Needed 13)  Proventil Hfa 108 (90 Base) Mcg/act Aers (Albuterol Sulfate) .... Two Times A Day As Needed 14)  Omeprazole 20 Mg Cpdr (Omeprazole) .... One Tablet By Mouth Once Daily 15)  Oxycodone-Acetaminophen 10-650 Mg Tabs (Oxycodone-Acetaminophen) .... As Needed 16)  Tamsulosin Hcl 0.4 Mg Caps (Tamsulosin Hcl) .Marland Kitchen.. 1 By Mouth Once Daily 17)  Furosemide 20 Mg Tabs (Furosemide) .Marland Kitchen.. 1 By Mouth Once Daily 18)  Bisacodyl Ec 5 Mg Tbec (Bisacodyl) .... 2 By Mouth Two Times A Day 19)  Vitamin D 1000 Unit Tabs (Cholecalciferol) .Marland Kitchen.. 1 Pill Every 5 Days  Allergies (verified): 1)  ! Iodine  Past History:  Past Medical History: Diverticulosis Hemorrhoids Diabetes Mellitus type II Anxiety Disorder Hypertension Multiple Myeloma Urinary retention Anemia Diabetic neuropathy Constipation Acute gastroenteritis 02/2007 Herpes Zoster Arthritis Hyperlipidemia Urinary Tract Infection  Past Surgical History: Reviewed history from 03/09/2008 and no changes required. Knee Arthroscopy PORACATH PLACEMENT  Family History: Reviewed history from 03/09/2008 and no changes required. Family History of Breast Cancer: Mother, Sister Family History of Diabetes: sister  Social History: Reviewed history from 03/09/2008 and no changes required. Divorced Patient has never smoked.  Alcohol Use - no Illicit Drug Use - no Patient does not get regular exercise.   Review of Systems       The patient complains of shortness of breath.  The pertinent positives and negatives are noted as above and in the HPI. All other ROS were reviewed and were negative.   Vital Signs:  Patient profile:   60 year old male Height:      71 inches Weight:      205.13 pounds BMI:     28.71 Pulse rate:   82 / minute Pulse rhythm:    regular BP sitting:   140 / 70  (left arm)  Vitals Entered By: Christian Mate CMA Deborra Medina) (April 01, 2009 11:09 AM)  Physical Exam  General:  Well developed, well nourished, no acute distress. Head:  Normocephalic and atraumatic. Eyes:  PERRLA, no icterus. Mouth:  No deformity or lesions, dentition normal.  Impression & Recommendations:  Problem # 1:  DYSPHAGIA (ICD-787.29) Severe and persistent liquid and solid dysphagia. Rule out motility disorder. Schedule barium esophagram and esophageal manometry. Continue omeprazole 20 mg daily for the possibility of GERD. Consider gastric emptying study pending review of above studies  Patient Instructions: 1)  You have been scheduled for a Barium Swallow and Esophageal Manometry. 2)  Please continue current medications.  3)  Please schedule a follow-up appointment one week after your manometry study. 4)  Copy sent to : Barbette Merino, MD 5)  The medication list was reviewed and reconciled.  All changed / newly prescribed medications were explained.  A complete medication list was provided to the patient / caregiver.  Appended Document: Orders Update    Clinical Lists Changes  Orders: Added new Test order of Barium Swallow (Barium Swallow) - Signed Added new Test order of Manometry (Manometry) - Signed

## 2010-03-28 LAB — BASIC METABOLIC PANEL
BUN: 12 mg/dL (ref 6–23)
Chloride: 105 mEq/L (ref 96–112)
GFR calc non Af Amer: 35 mL/min — ABNORMAL LOW (ref >60)
Potassium: 3.8 mEq/L (ref 3.5–5.1)
Sodium: 141 mEq/L (ref 135–145)

## 2010-03-28 LAB — CROSSMATCH: Unit division: 0

## 2010-03-28 LAB — GLUCOSE, CAPILLARY
Glucose-Capillary: 119 mg/dL — ABNORMAL HIGH (ref 70–99)
Glucose-Capillary: 138 mg/dL — ABNORMAL HIGH (ref 70–99)

## 2010-03-28 LAB — CBC
MCV: 78.8 fL (ref 78.0–100.0)
Platelets: 171 10*3/uL (ref 150–400)
RBC: 3.77 MIL/uL — ABNORMAL LOW (ref 4.22–5.81)
RDW: 16.5 % — ABNORMAL HIGH (ref 11.5–15.5)
WBC: 5.5 10*3/uL (ref 4.0–10.5)

## 2010-03-29 ENCOUNTER — Inpatient Hospital Stay (HOSPITAL_COMMUNITY): Admission: RE | Admit: 2010-03-29 | Payer: Self-pay | Source: Ambulatory Visit

## 2010-03-29 DIAGNOSIS — C9000 Multiple myeloma not having achieved remission: Secondary | ICD-10-CM

## 2010-03-29 DIAGNOSIS — R42 Dizziness and giddiness: Secondary | ICD-10-CM

## 2010-03-29 LAB — BASIC METABOLIC PANEL
CO2: 26 mEq/L (ref 19–32)
Calcium: 9.2 mg/dL (ref 8.4–10.5)
Chloride: 105 mEq/L (ref 96–112)
Creatinine, Ser: 1.72 mg/dL — ABNORMAL HIGH (ref 0.4–1.5)
GFR calc Af Amer: 49 mL/min — ABNORMAL LOW (ref >60)
Glucose, Bld: 111 mg/dL — ABNORMAL HIGH (ref 70–99)

## 2010-03-29 LAB — CBC
HCT: 30.3 % — ABNORMAL LOW (ref 39.0–52.0)
Hemoglobin: 10.1 g/dL — ABNORMAL LOW (ref 13.0–17.0)
MCH: 26.3 pg (ref 26.0–34.0)
MCHC: 33.3 g/dL (ref 30.0–36.0)
MCV: 78.9 fL (ref 78.0–100.0)
RBC: 3.84 MIL/uL — ABNORMAL LOW (ref 4.22–5.81)

## 2010-03-29 LAB — GLUCOSE, CAPILLARY

## 2010-03-31 ENCOUNTER — Encounter (HOSPITAL_BASED_OUTPATIENT_CLINIC_OR_DEPARTMENT_OTHER): Payer: Medicaid Other | Admitting: Hematology & Oncology

## 2010-03-31 ENCOUNTER — Other Ambulatory Visit: Payer: Self-pay | Admitting: Hematology & Oncology

## 2010-03-31 DIAGNOSIS — C9000 Multiple myeloma not having achieved remission: Secondary | ICD-10-CM

## 2010-03-31 LAB — CBC WITH DIFFERENTIAL (CANCER CENTER ONLY)
BASO#: 0 10*3/uL (ref 0.0–0.2)
Eosinophils Absolute: 0.3 10*3/uL (ref 0.0–0.5)
HGB: 11.5 g/dL — ABNORMAL LOW (ref 13.0–17.1)
LYMPH%: 20.6 % (ref 14.0–48.0)
MCH: 26.6 pg — ABNORMAL LOW (ref 28.0–33.4)
MCV: 80 fL — ABNORMAL LOW (ref 82–98)
MONO#: 0.7 10*3/uL (ref 0.1–0.9)
NEUT#: 4.7 10*3/uL (ref 1.5–6.5)
Platelets: 201 10*3/uL (ref 145–400)
RBC: 4.32 10*6/uL (ref 4.20–5.70)
WBC: 7.2 10*3/uL (ref 4.0–10.0)

## 2010-03-31 LAB — COMPREHENSIVE METABOLIC PANEL
ALT: 15 U/L (ref 0–53)
AST: 19 U/L (ref 0–37)
Alkaline Phosphatase: 46 U/L (ref 39–117)
Potassium: 4 mEq/L (ref 3.5–5.3)
Sodium: 141 mEq/L (ref 135–145)
Total Bilirubin: 0.3 mg/dL (ref 0.3–1.2)
Total Protein: 7.9 g/dL (ref 6.0–8.3)

## 2010-04-01 LAB — UIFE/LIGHT CHAINS/TP QN, 24-HR UR
Albumin, U: DETECTED
Alpha 1, Urine: DETECTED — AB
Free Lambda Excretion/Day: 26.75 mg/d
Free Lambda Lt Chains,Ur: 2.61 mg/dL — ABNORMAL HIGH (ref 0.08–1.01)
Time: 24 hours
Total Protein, Urine-Ur/day: 316 mg/d — ABNORMAL HIGH (ref 10–140)
Total Protein, Urine: 30.8 mg/dL

## 2010-04-01 NOTE — Discharge Summary (Signed)
[name], [name] NO.:  [id number]  MEDICAL RECORD NO.:  [id number]           PATIENT TYPE:  I  LOCATION:  1340                         FACILITY:  [facility]  PHYSICIAN:  Jacquelynn Cree, M.D.   DATE OF BIRTH:  Nov 19, 1950  DATE OF ADMISSION:  03/25/2010 DATE OF DISCHARGE:  03/29/2010                              DISCHARGE SUMMARY   PRIMARY CARE PHYSICIAN:  Barbette Merino, M.D.  ONCOLOGIST:  Rudell Cobb. [name], M.D.  DISCHARGE DIAGNOSES: 1. Vertigo. 2. Acute renal failure. 3. Stage III chronic kidney disease. 4. Microcytic anemia. 5. Type 2 diabetes. 6. Multiple myeloma. 7. Chronic lower back pain. 8. Transient orthopnea.  DISCHARGE MEDICATIONS: 1. Valium 5 mg p.o. q.6 h. p.r.n. vertigo. 2. Oxycodone/acetaminophen 10/325 mg 1-2 tablets p.o. q.4 h. p.r.n.     pain. 3. OxyContin 60 mg p.o. b.i.d. 4. Amlodipine 10 mg p.o. daily. 5. Bisacodyl 5 mg 2 tablets p.o. b.i.d. 6. Fentanyl patch 25 mcg 1 patch transdermally q.72 h. p.r.n. 7. Glipizide 5 mg p.o. daily. 8. Lasix 20 mg p.o. daily. 9. Prednisolone acetate ophthalmic suspension 1 drop in both eyes     daily. 10.Robaxin 500 mg p.o. q.6 h. p.r.n. pain. 11.Tamsulosin 0.4 mg p.o. daily. 12.Vitamin D2 50,000 units 1 capsule every week on Saturdays.  Note:  The following medications have been discontinued secondary to acute renal failure:  Ibuprofen, Klor-Con, Maxzide.  CONSULTATIONS:  Rudell Cobb. [name], M.D., Oncology  BRIEF ADMISSION HISTORY OF PRESENT ILLNESS:  The patient is a 60 year old male with a past medical history of multiple myeloma who presented to the hospital with chief complaint of weakness and vertigo-type symptoms.  The patient had been undergoing chemotherapy twice weekly under the care of Dr. [name] prior to admission.  Upon initial evaluation in the emergency department, the patient was found to have acute renal failure and uncontrolled pain.  He subsequently was referred to the  hospitalist service for an inpatient evaluation.  For the full details, please see the dictated report done by Dr. Zigmund Daniel.  PROCEDURES AND DIAGNOSTIC STUDIES: 1. CT scan of the head on March 24, 2010, was negative. 2. Chest x-ray on March 25, 2010, showed stable cardiomegaly with no     acute cardiopulmonary disease. 3. MRI of the brain on March 26, 2010, showed mild atrophy with no     focal or acute abnormality. 4. Two-dimensional echocardiogram on March 26, 2010, showed normal     systolic function with an ejection fraction estimated at 55% to     60%.  There was no regional wall motion abnormality. 5. MRI of the lumbar spine on March 26, 2010, showed no change since     previous study.  Diffusely hypercellular pattern without evidence     of fracture or lytic destructive lesions.  No extraosseous tumor.     Disk degeneration at L4-5 with annular bulging, most prominent in     the right foraminal to extraforaminal region.  This could effect     the right L4 nerve root.  No apparent change since previous study.     Disk degeneration at L5-S1.  Annular  bulging more prominent towards     the left with mild noncompressive narrowing of the left lateral     recess, unchanged since previous study.  DISCHARGE LABORATORY VALUES:  Sodium was 139, potassium 4.0, chloride 105, bicarbonate 26, BUN 12, creatinine 1.72, glucose 111, calcium 9.2. White blood cell count was 5.2, hemoglobin 10.1, hematocrit 30.3, platelets 164.  HOSPITAL COURSE BY PROBLEM: 1. Vertigo:  The patient's vertigo responded to p.r.n. Valium.     Meclizine was ineffective.  Full diagnostic evaluation with MRI     scans as well as CT scanning of the head were unrevealing. 2. Acute renal failure in setting of stage III chronic kidney disease:     The patient was monitored closely and his diuretic therapy was     held.  Over the course of his hospital stay, his creatinine has     gradually improved, though it is  not at baseline values.  His     current creatinine is probably reflective of his history of     multiple myeloma. 3. Microcytic anemia:  The patient did receive 2 units of packed red     blood cells while in the hospital.  His anemia secondary to     multiple myeloma.  Hemoglobin at discharge is 10.1. 4. Type 2 diabetes:  The patient's glipizide was held while in the     hospital and he was placed on sliding scale insulin with good     glycemic control.  He could safely resume glipizide at discharge. 5. Multiple myeloma:  The patient will follow up with Dr. [name].     Outstanding studies requested by Dr. [name] include 24-hour     protein collection of his urine.  He can follow up with Dr. [name]     for these results. 6. Chronic lower back pain:  The patient had a followup MRI to rule     out lytic lesions.  The MRI was essentially change from previous     studies.  At this point, the patient will need aggressive     outpatient pain control to prevent him from cycling in-and-out of     the hospital for pain issues.  DISPOSITION:  The patient is medically stable and be discharged home.  Time spent coordinating care for discharge and discharge instructions including face-to-face time equals 35 minutes.     Jacquelynn Cree, M.D.     CR/MEDQ  D:  03/29/2010  T:  03/30/2010  Job:  [id number]  cc:   Barbette Merino, M.D.  Rudell Cobb. [name], M.D. [phone]  Electronically Signed by Jacquelynn Cree M.D. on 04/01/2010 06:48:56 PM

## 2010-04-14 ENCOUNTER — Other Ambulatory Visit: Payer: Self-pay | Admitting: Hematology & Oncology

## 2010-04-14 ENCOUNTER — Encounter (HOSPITAL_BASED_OUTPATIENT_CLINIC_OR_DEPARTMENT_OTHER): Payer: Medicaid Other | Admitting: Hematology & Oncology

## 2010-04-14 DIAGNOSIS — C9000 Multiple myeloma not having achieved remission: Secondary | ICD-10-CM

## 2010-04-14 LAB — CBC WITH DIFFERENTIAL (CANCER CENTER ONLY)
EOS%: 3.5 % (ref 0.0–7.0)
Eosinophils Absolute: 0.3 10*3/uL (ref 0.0–0.5)
LYMPH%: 24.1 % (ref 14.0–48.0)
MCH: 27 pg — ABNORMAL LOW (ref 28.0–33.4)
MCHC: 34 g/dL (ref 32.0–35.9)
MCV: 79 fL — ABNORMAL LOW (ref 82–98)
MONO%: 10 % (ref 0.0–13.0)
Platelets: 270 10*3/uL (ref 145–400)
RDW: 16.3 % — ABNORMAL HIGH (ref 10.5–14.6)

## 2010-04-16 LAB — COMPREHENSIVE METABOLIC PANEL
Albumin: 4.5 g/dL (ref 3.5–5.2)
CO2: 27 mEq/L (ref 19–32)
Chloride: 98 mEq/L (ref 96–112)
Glucose, Bld: 148 mg/dL — ABNORMAL HIGH (ref 70–99)
Potassium: 3.8 mEq/L (ref 3.5–5.3)
Sodium: 137 mEq/L (ref 135–145)
Total Protein: 7.7 g/dL (ref 6.0–8.3)

## 2010-04-16 LAB — SPEP & IFE WITH QIG
Albumin ELP: 52.7 % — ABNORMAL LOW (ref 55.8–66.1)
Alpha-2-Globulin: 14.6 % — ABNORMAL HIGH (ref 7.1–11.8)
Beta Globulin: 5.1 % (ref 4.7–7.2)
IgA: 328 mg/dL (ref 68–378)
Total Protein, Serum Electrophoresis: 7.7 g/dL (ref 6.0–8.3)

## 2010-04-16 LAB — KAPPA/LAMBDA LIGHT CHAINS: Kappa free light chain: 1.7 mg/dL (ref 0.33–1.94)

## 2010-04-16 LAB — LACTATE DEHYDROGENASE: LDH: 190 U/L (ref 94–250)

## 2010-04-17 NOTE — H&P (Signed)
[name], [name] NO.:  [id number]  MEDICAL RECORD NO.:  [id number]          PATIENT TYPE:  INP  LOCATION:  1340                         FACILITY:  [facility]  PHYSICIAN:  Leana Gamer, MDDATE OF BIRTH:  1950/12/29  DATE OF ADMISSION:  03/25/2010 DATE OF DISCHARGE:                             HISTORY & PHYSICAL   PRIMARY CARE PHYSICIAN:  Barbette Merino, M.D.  ONCOLOGIST:  Rudell Cobb. [name], M.D.  CHIEF COMPLAINT:  Weakness and dizziness.  The patient was sent by primary care physician.  HISTORY OF PRESENT ILLNESS:  Mr. Neault is a 60 year old African- American male, currently undergoing chemotherapy twice weekly for multiple myeloma, also with history of diabetes and hypertension.  The patient presented to the emergency department last evening on January 30 with complaints of dizziness and weakness.  ER workup revealed a creatinine of 2.59 (PCP reports baseline creatinine of approximately 1.7) and negative head CT.  The patient was discharged home at that time to follow up with primary care physician.  The patient followed up with Dr. Jonelle Sidle today and was sent for direct admission due to persistent weakness, intractable dizziness and renal failure seen on ER labs.  The patient describes dizziness for approximately 3 weeks, becoming progressively worse.  He states symptoms are unchanged with any movement.  He describes some vertiginous symptoms.  However, his symptoms are very vague.  The patient also describes progressive weakness and occasional sweats.  He denies any recent fever, chest pain or change in appetite.  The patient also describes orthopnea that has been ongoing for several weeks.  Again, the only lab work available at this time is that obtained by the emergency department on January 30.  The patient will be further evaluated by [facility] to determine need for treatment regimen.  PAST MEDICAL HISTORY: 1. Multiple myeloma, currently  undergoing chemotherapy twice weekly. 2. Hypertension. 3. Type 2 diabetes. 4. History of urinary retention, followed by Dr. Diona Fanti. 5. Chronic anemia. 6. Constipation. 7. Diabetic neuropathy. 8. Chronic pain related to multiple myeloma. 9. History of diabetic foot ulcers, followed at [facility] in     2009.  HOME MEDICATIONS: 1. Prednisolone ophthalmic suspension in both eyes 1 drop daily. 2. Maxzide 37.5/25 p.o. daily. 3. Chemotherapy regimen includes Velcade, Zometa and Doxil. 4. Glipizide 5 mg p.o. daily. 5. Amlodipine 10 mg p.o. daily. 6. Klor-Con 20 mEq p.o. daily. 7. Oxycodone/APAP 10/325 mg p.o. q.6 h p.r.n. pain. 8. Lasix 20 mg p.o. daily. 9. Fentanyl patch 25 mcg q.72 h. 10.Robaxin 500 mg p.o. q.6 h p.r.n. pain. 11.Flomax 0.4 mg p.o. daily. 12.Ibuprofen 800 mg p.o. q.8 h p.r.n. pain. 13.Bisacodyl 5 mg 2 tablets p.o. b.i.d. 14.Vitamin D2 50,000 units p.o. weekly. 15.OxyContin 40 mg p.o. b.i.d.  ALLERGIES:  IODINE with unknown reaction.  FAMILY HISTORY:  Positive for coronary artery disease.  SOCIAL HISTORY:  The patient is single.  He currently lives with his sister who acts as a Land.  He denies any history of tobacco or EtOH use.  REVIEW OF SYSTEMS:  As stated in HPI, otherwise negative.  PHYSICAL EXAMINATION:  VITAL SIGNS:  Blood  pressure 112/69, heart rate 78, respirations 20, temperature 98.1, O2 saturation is 100% on room air. GENERAL:  This is an African-American male lying supine in bed in no acute distress. HEAD:  Head is normocephalic, atraumatic. EYES:  Extraocular movements are intact.  No scleral icterus or injection.  No nystagmus. ENT:  Mucous membranes are dry.  No oropharyngeal lesions are noted. NECK:  Neck is supple with no thyromegaly or lymphadenopathy.  No JVD or carotid bruits. CHEST:  Symmetrical movement, nontender to palpation. CARDIOVASCULAR:  S1,S2.  Regular, rate and rhythm.  No murmur, rub or gallop. EXTREMITIES:   No lower extremity edema. RESPIRATORY:  The patient does have very mild bibasilar crackles.  No wheezes or rales.  No increased work of breathing. GI:  Abdomen is protuberant, soft, nontender, nondistended with positive bowel sounds.  No appreciated masses or hepatosplenomegaly. NEUROLOGIC:  The patient is able to move all extremities x4 without motor or sensory deficits on exam. PSYCHOLOGIC:  The patient is alert and oriented x4 with very pleasant mood and affect.  PERTINENT LABORATORY AND X-RAY DATA:  Labs obtained on March 24, 2010, in the emergency department:  White cell count 7.4, platelet count 249, hemoglobin 11.3, hematocrit 34.3, sodium 139, potassium 3.5, chloride 103, CO2 of 25, BUN 23, creatinine 2.59, up from 1.51 in July of 2011, serum glucose 99.  LFTs within normal limits.  Albumin 3.9.  CT of the head with no acute findings.  ASSESSMENT/PLAN: 1. Intractable dizziness.  Again, the patient's symptoms are very     vague.  He does somewhat describe vertiginous symptoms.  Will trial     p.r.n. meclizine to determine efficacy.  However, given the     patient's multiple risk factors, will also check MRI of brain to     rule out any neurological event not seen on CT done on day prior.     Otherwise physical and occupational therapy evaluation with     improvement of the patient's symptoms. 2. Acute renal failure.  Suspect prerenal in nature with dehydration.     Will order for very gentle IV fluids.  We will recheck the     patient's creatinine closely to determine need for further workup.     Will hold renal toxic agents, including the patient's home     diuretics. 3. Orthopnea.  Unclear etiology.  The patient appears to be euvolemic     on exam.  However, he does have some mild bibasilar crackles.  In     addition chemotherapy agents are known to cause heart failure.     Will check a chest x-ray as well as a BNP based on this workup and     will consider 2-D  echocardiogram. 4. Type 2 diabetes.  Will hold oral agents.  Order for sliding scale     insulin while inpatient. 5. Multiple myeloma. 6. Prophylaxis.  Order SCDs for DVT prophylaxis. 7. Code status.  The patient is a full code.     Patrici Ranks, NP   ______________________________ Leana Gamer, MD    LE/MEDQ  D:  03/25/2010  T:  03/25/2010  Job:  [id number]  cc:   Rudell Cobb. [name], M.D. [phone]  Barbette Merino, M.D.  Electronically Signed by Patrici Ranks NP on 04/11/2010 12:14:07 PM Electronically Signed by Liston Alba MD on 04/17/2010 02:12:41 PM

## 2010-05-06 LAB — DIFFERENTIAL
Basophils Relative: 1 % (ref 0–1)
Eosinophils Absolute: 0.2 10*3/uL (ref 0.0–0.7)
Eosinophils Relative: 3 % (ref 0–5)
Lymphs Abs: 2.5 10*3/uL (ref 0.7–4.0)

## 2010-05-06 LAB — CHROMOSOME ANALYSIS, BONE MARROW

## 2010-05-06 LAB — TISSUE HYBRIDIZATION (BONE MARROW)-NCBH

## 2010-05-06 LAB — CBC
MCV: 81.3 fL (ref 78.0–100.0)
Platelets: 249 10*3/uL (ref 150–400)
RBC: 4.21 MIL/uL — ABNORMAL LOW (ref 4.22–5.81)
WBC: 7.4 10*3/uL (ref 4.0–10.5)

## 2010-05-10 LAB — URINALYSIS, ROUTINE W REFLEX MICROSCOPIC
Glucose, UA: NEGATIVE mg/dL
Hgb urine dipstick: NEGATIVE
Specific Gravity, Urine: 1.013 (ref 1.005–1.030)
Urobilinogen, UA: 0.2 mg/dL (ref 0.0–1.0)

## 2010-05-10 LAB — COMPREHENSIVE METABOLIC PANEL
BUN: 14 mg/dL (ref 6–23)
Calcium: 9.1 mg/dL (ref 8.4–10.5)
Glucose, Bld: 143 mg/dL — ABNORMAL HIGH (ref 70–99)
Sodium: 137 mEq/L (ref 135–145)
Total Protein: 7.8 g/dL (ref 6.0–8.3)

## 2010-05-10 LAB — DIFFERENTIAL
Lymphs Abs: 2 10*3/uL (ref 0.7–4.0)
Monocytes Relative: 8 % (ref 3–12)
Neutro Abs: 4.8 10*3/uL (ref 1.7–7.7)
Neutrophils Relative %: 64 % (ref 43–77)

## 2010-05-10 LAB — CBC
HCT: 34.5 % — ABNORMAL LOW (ref 39.0–52.0)
MCHC: 33.2 g/dL (ref 30.0–36.0)
MCV: 78.7 fL (ref 78.0–100.0)
RDW: 17.5 % — ABNORMAL HIGH (ref 11.5–15.5)

## 2010-05-12 ENCOUNTER — Other Ambulatory Visit: Payer: Self-pay | Admitting: Hematology & Oncology

## 2010-05-12 ENCOUNTER — Encounter (HOSPITAL_BASED_OUTPATIENT_CLINIC_OR_DEPARTMENT_OTHER): Payer: Medicaid Other | Admitting: Hematology & Oncology

## 2010-05-12 DIAGNOSIS — Z5111 Encounter for antineoplastic chemotherapy: Secondary | ICD-10-CM

## 2010-05-12 DIAGNOSIS — C9000 Multiple myeloma not having achieved remission: Secondary | ICD-10-CM

## 2010-05-12 LAB — CBC WITH DIFFERENTIAL (CANCER CENTER ONLY)
BASO%: 0.4 % (ref 0.0–2.0)
EOS%: 3.1 % (ref 0.0–7.0)
Eosinophils Absolute: 0.3 10*3/uL (ref 0.0–0.5)
MCH: 27 pg — ABNORMAL LOW (ref 28.0–33.4)
MONO%: 10.2 % (ref 0.0–13.0)
NEUT#: 6.1 10*3/uL (ref 1.5–6.5)
Platelets: 234 10*3/uL (ref 145–400)
RBC: 3.96 10*6/uL — ABNORMAL LOW (ref 4.20–5.70)

## 2010-05-12 LAB — CMP (CANCER CENTER ONLY)
Albumin: 4 g/dL (ref 3.3–5.5)
Alkaline Phosphatase: 74 U/L (ref 26–84)
CO2: 28 mEq/L (ref 18–33)
Glucose, Bld: 213 mg/dL — ABNORMAL HIGH (ref 73–118)
Potassium: 4.2 mEq/L (ref 3.3–4.7)
Sodium: 141 mEq/L (ref 128–145)
Total Protein: 8.8 g/dL — ABNORMAL HIGH (ref 6.4–8.1)

## 2010-05-19 ENCOUNTER — Other Ambulatory Visit: Payer: Self-pay | Admitting: Gastroenterology

## 2010-06-04 LAB — COMPREHENSIVE METABOLIC PANEL
ALT: 16 U/L (ref 0–53)
AST: 23 U/L (ref 0–37)
Albumin: 3.8 g/dL (ref 3.5–5.2)
Alkaline Phosphatase: 54 U/L (ref 39–117)
Chloride: 107 mEq/L (ref 96–112)
GFR calc Af Amer: 58 mL/min — ABNORMAL LOW (ref >60)
Potassium: 2.9 mEq/L — ABNORMAL LOW (ref 3.5–5.1)
Sodium: 141 mEq/L (ref 135–145)
Total Bilirubin: 0.4 mg/dL (ref 0.3–1.2)

## 2010-06-04 LAB — CBC
HCT: 29.9 % — ABNORMAL LOW (ref 39.0–52.0)
Platelets: 238 10*3/uL (ref 150–400)
WBC: 6.9 10*3/uL (ref 4.0–10.5)

## 2010-06-04 LAB — GLUCOSE, CAPILLARY: Glucose-Capillary: 140 mg/dL — ABNORMAL HIGH (ref 70–99)

## 2010-06-04 LAB — DIFFERENTIAL
Basophils Absolute: 0.1 10*3/uL (ref 0.0–0.1)
Basophils Relative: 1 % (ref 0–1)
Eosinophils Relative: 4 % (ref 0–5)
Monocytes Absolute: 0.7 10*3/uL (ref 0.1–1.0)
Monocytes Relative: 10 % (ref 3–12)

## 2010-06-04 LAB — POCT CARDIAC MARKERS: CKMB, poc: 5.7 ng/mL (ref 1.0–8.0)

## 2010-06-04 LAB — URINALYSIS, ROUTINE W REFLEX MICROSCOPIC
Bilirubin Urine: NEGATIVE
Hgb urine dipstick: NEGATIVE
Ketones, ur: NEGATIVE mg/dL
Nitrite: NEGATIVE
Urobilinogen, UA: 0.2 mg/dL (ref 0.0–1.0)
pH: 6 (ref 5.0–8.0)

## 2010-06-09 ENCOUNTER — Other Ambulatory Visit: Payer: Self-pay | Admitting: Hematology & Oncology

## 2010-06-09 ENCOUNTER — Encounter (HOSPITAL_BASED_OUTPATIENT_CLINIC_OR_DEPARTMENT_OTHER): Payer: Medicaid Other | Admitting: Hematology & Oncology

## 2010-06-09 DIAGNOSIS — C9000 Multiple myeloma not having achieved remission: Secondary | ICD-10-CM

## 2010-06-09 DIAGNOSIS — Z5111 Encounter for antineoplastic chemotherapy: Secondary | ICD-10-CM

## 2010-06-09 LAB — GLUCOSE, CAPILLARY
Glucose-Capillary: 122 mg/dL — ABNORMAL HIGH (ref 70–99)
Glucose-Capillary: 154 mg/dL — ABNORMAL HIGH (ref 70–99)

## 2010-06-09 LAB — CBC WITH DIFFERENTIAL (CANCER CENTER ONLY)
BASO#: 0.1 10*3/uL (ref 0.0–0.2)
Eosinophils Absolute: 0.2 10*3/uL (ref 0.0–0.5)
HGB: 9.4 g/dL — ABNORMAL LOW (ref 13.0–17.1)
MCH: 26.1 pg — ABNORMAL LOW (ref 28.0–33.4)
MONO#: 0.7 10*3/uL (ref 0.1–0.9)
MONO%: 9.2 % (ref 0.0–13.0)
NEUT#: 4.3 10*3/uL (ref 1.5–6.5)
Platelets: 222 10*3/uL (ref 145–400)
RBC: 3.6 10*6/uL — ABNORMAL LOW (ref 4.20–5.70)
WBC: 7.2 10*3/uL (ref 4.0–10.0)

## 2010-06-09 LAB — CMP (CANCER CENTER ONLY)
Albumin: 4 g/dL (ref 3.3–5.5)
Alkaline Phosphatase: 57 U/L (ref 26–84)
BUN, Bld: 27 mg/dL — ABNORMAL HIGH (ref 7–22)
CO2: 29 mEq/L (ref 18–33)
Calcium: 9.7 mg/dL (ref 8.0–10.3)
Chloride: 96 mEq/L — ABNORMAL LOW (ref 98–108)
Glucose, Bld: 157 mg/dL — ABNORMAL HIGH (ref 73–118)
Potassium: 3.9 mEq/L (ref 3.3–4.7)
Sodium: 138 mEq/L (ref 128–145)
Total Protein: 8.9 g/dL — ABNORMAL HIGH (ref 6.4–8.1)

## 2010-06-11 LAB — SPEP & IFE WITH QIG
Albumin ELP: 50 % — ABNORMAL LOW (ref 55.8–66.1)
Alpha-1-Globulin: 3.6 % (ref 2.9–4.9)
Alpha-2-Globulin: 13.8 % — ABNORMAL HIGH (ref 7.1–11.8)
Gamma Globulin: 21 % — ABNORMAL HIGH (ref 11.1–18.8)
IgM, Serum: 73 mg/dL (ref 60–263)
Total Protein, Serum Electrophoresis: 8.3 g/dL (ref 6.0–8.3)

## 2010-06-11 LAB — VITAMIN D 25 HYDROXY (VIT D DEFICIENCY, FRACTURES): Vit D, 25-Hydroxy: 38 ng/mL (ref 30–89)

## 2010-06-11 LAB — KAPPA/LAMBDA LIGHT CHAINS: Kappa free light chain: 1.59 mg/dL (ref 0.33–1.94)

## 2010-06-20 ENCOUNTER — Encounter: Payer: Medicaid Other | Admitting: Cardiology

## 2010-06-26 ENCOUNTER — Other Ambulatory Visit: Payer: Self-pay | Admitting: Cardiology

## 2010-06-26 ENCOUNTER — Encounter (INDEPENDENT_AMBULATORY_CARE_PROVIDER_SITE_OTHER): Payer: Medicaid Other | Admitting: Cardiology

## 2010-06-26 DIAGNOSIS — L98499 Non-pressure chronic ulcer of skin of other sites with unspecified severity: Secondary | ICD-10-CM

## 2010-06-26 DIAGNOSIS — L97509 Non-pressure chronic ulcer of other part of unspecified foot with unspecified severity: Secondary | ICD-10-CM

## 2010-06-26 DIAGNOSIS — I739 Peripheral vascular disease, unspecified: Secondary | ICD-10-CM

## 2010-06-30 ENCOUNTER — Encounter: Payer: Self-pay | Admitting: Podiatry

## 2010-07-07 ENCOUNTER — Other Ambulatory Visit: Payer: Self-pay | Admitting: Hematology & Oncology

## 2010-07-07 ENCOUNTER — Encounter (HOSPITAL_BASED_OUTPATIENT_CLINIC_OR_DEPARTMENT_OTHER): Payer: Medicaid Other | Admitting: Hematology & Oncology

## 2010-07-07 DIAGNOSIS — D509 Iron deficiency anemia, unspecified: Secondary | ICD-10-CM

## 2010-07-07 DIAGNOSIS — Z5111 Encounter for antineoplastic chemotherapy: Secondary | ICD-10-CM

## 2010-07-07 DIAGNOSIS — C9000 Multiple myeloma not having achieved remission: Secondary | ICD-10-CM

## 2010-07-07 LAB — CBC WITH DIFFERENTIAL (CANCER CENTER ONLY)
BASO%: 0.8 % (ref 0.0–2.0)
Eosinophils Absolute: 0.2 10*3/uL (ref 0.0–0.5)
HCT: 30.4 % — ABNORMAL LOW (ref 38.7–49.9)
LYMPH#: 2.1 10*3/uL (ref 0.9–3.3)
MCV: 79 fL — ABNORMAL LOW (ref 82–98)
MONO#: 0.8 10*3/uL (ref 0.1–0.9)
NEUT%: 58.6 % (ref 40.0–80.0)
RBC: 3.86 10*6/uL — ABNORMAL LOW (ref 4.20–5.70)
RDW: 13.3 % (ref 11.1–15.7)
WBC: 7.8 10*3/uL (ref 4.0–10.0)

## 2010-07-07 LAB — CMP (CANCER CENTER ONLY)
ALT(SGPT): 20 U/L (ref 10–47)
AST: 23 U/L (ref 11–38)
Albumin: 3.7 g/dL (ref 3.3–5.5)
CO2: 30 mEq/L (ref 18–33)
Calcium: 9.2 mg/dL (ref 8.0–10.3)
Chloride: 94 mEq/L — ABNORMAL LOW (ref 98–108)
Potassium: 3.8 mEq/L (ref 3.3–4.7)

## 2010-07-07 LAB — CHCC SATELLITE - SMEAR

## 2010-07-08 NOTE — Discharge Summary (Signed)
[name], [name] NO.:  [id number]   MEDICAL RECORD NO.:  [id number]          PATIENT TYPE:  INP   LOCATION:  [id number]                         [facility]ACILITY:  [facility]   PHYSICIAN:  Rexene Alberts, M.D.    DATE OF BIRTH:  [birth year]   DATE OF ADMISSION:  01/11/2007  DATE OF DISCHARGE:  01/15/2007                               DISCHARGE SUMMARY   DISCHARGE DIAGNOSES:  1. Polyarthralgias/polymyalgias, etiology unclear.  Less likely      secondary to smoldering multiple myeloma, per Dr. [name].      Consider psychosomatic in origin.  2. Acute-on-chronic headache.  3. Type 2 diabetes mellitus.  4. Hypertension.  5. Multiple myeloma, diagnosed in September, 2008.  6. Microcytic anemia with a baseline hemoglobin ranging from 9.5 to      10.5.   DISCHARGE MEDICATIONS:  1. OxyContin 20 mg b.i.d.  2. Vicodin 5 mg every 4 hours as needed for pain.  3. Tramadol 50 mg every 4 hours as needed for pain.  4. Neurontin 100 mg t.i.d.  5. Flexeril 10 mg daily p.r.n. spasms.  6. Glucotrol 10 mg daily (do not take if your blood glucose is less      than 100).  7. Lisinopril 10 mg daily.  8. Flomax 0.4 mg daily.  9. MiraLax 17 gm in 8 ounces of water daily.  10.STOP NORVASC.   CONSULTATIONS:  Dr. Julien Nordmann and Dr. [name], oncologists.   PROCEDURES PERFORMED:  CT scan of the head on January 14, 2007.  The  results revealed a negative noncontrasted CT.   HISTORY OF PRESENT ILLNESS:  Patient is a 60 year old man with a past  medical history significant for a recent diagnosis of IgG Kappa myeloma  in September, 2008, hypertension, and diabetes mellitus.  He presented  to the emergency department on January 11, 2007 with a chief complaint  of increased pain in his joints and muscles.  When the patient was seen  in the emergency department, he was hemodynamically stable and afebrile.  Oncologist, Dr. Julien Nordmann, evaluated the patient and felt that the  patient's pain was unlikely  secondary to multiple myeloma.  Therefore,  Dr. Julien Nordmann recommended that the hospitalist service admit the patient.  Given the patient's recent history, he was admitted for further  evaluation and management.   For additional details, please see the dictated history and physical.   HOSPITAL COURSE:  1. POLYMYALGIAS/POLYARTHRALGIAS:  The patient was recently discharged      from the hospital 2 1/2 weeks ago by Dr. [name] for diffuse muscle      and joint pain.  During the hospitalization two weeks ago, Dr.      [name] ordered a number of tests, including HIV, creatinine      kinase, sed rate, ANA, and aldolase.  All of these tests were      normal with the exception of the sed rate, which was elevated at      95.  During the hospitalization, Dr. [name] added Neurontin for      his ongoing pain.  The patient had been recently started on  treatment with Flexeril, Ultram, ibuprofen, and Vicodin, in part by      his primary care physician, Dr. Jonelle Sidle.  When the patient presented      again on the 18th, he reported a one week history of worsening      muscle and joint pain.  On exam, the patient had no evidence of      edema, erythema, or acute joint findings.  For the most part, the      patient was sedentary and supine during the hospitalization.      Although he continued to complain of 10/10 pain, he appeared to be      comfortable and without acute distress.  For further evaluation,      LDH, acute viral hepatitis panel, and rheumatoid factor were      ordered.  The LDH was within normal limits at 123.  The rheumatoid      factor was less than 20.  The acute hepatitis panel was negative.      The sed rate was ordered again and was found to be mildly elevated      at 32.   Dr. [name], the patient's primary oncologist, provided the follow-up  oncology assessment.  Per my conversation with Dr. [name], he is  somewhat perplexed by the patient's complaints of arthralgias.  Under   the circumstances of a smoldering myeloma, Dr. [name] did not believe  that the patient's pain was secondary to this diagnosis.  Dr. [name],  however, did order a few more tests, including RPR, complement, and a  Lyme disease titer.  The C3 and C4 levels were within normal limits.  The RPR was nonreactive.  The Lyme titer results are currently pending.  The tentative plan is to have the patient referred to a rheumatologist  at [facility] in [facility], by Dr. [name].   During the hospitalization, the patient was treated with as-needed  Percocet, as-needed tramadol, and as-needed Dilaudid.  In addition,  OxyContin was added at 20 mg q.12h.  The patient was maintained on  Neurontin as previously prescribed.  In addition, the patient received  40 mg of Decadron, as ordered by Dr. [name]. The patient maintained  that he was still in pain.  The dictating physician believes that there  may be an underlying psychosomatic component to the patient's pain and  complaints.  At times, he appeared depressed and at other times somewhat  indifferent; however, the patient denied depression.  The dictating  physician believes that the patient would benefit from an outpatient  psychiatric consultation and evaluation.   1. MICROCYTIC ANEMIA:  The patient's anemia is felt to be secondary to      multiple myeloma.  Iron studies were performed during the      hospitalization and revealed a total iron of 84, TIBC of 258, and      percent saturation of 32.  His ferritin level was within normal      limits (slightly above limits) at 404.  The patient was advised to      take a multivitamin with iron once daily.  The patient also      received one dose of Aranesp, as prescribed by Dr. [name].   1. TYPE 2 DIABETES MELLITUS:  The patient's capillary blood glucose      was well controlled during the majority of the hospitalization.  In      fact, he did have one short-lived symptomatic episode of  hypoglycemia when his capillary blood glucose fell to 63.  He was      treated appropriately.  After being given the Decadron, his      capillary blood glucose did increase somewhat.  The patient was      treated with sliding-scale NovoLog during the hospitalization.  He      was advised to restart Glucotrol following hospital discharge.  The      patient was also advised to not take the Glucotrol if his capillary      blood glucose was below 100.   1. HYPERTENSION:  The patient was restarted on his usual dose of      Norvasc and lisinopril during the hospitalization.  However, his      blood pressures were on the lower side of normal; therefore, Dr.      [name] discontinued the Norvasc. The patient was advised to      continue lisinopril, but to discontinue the Norvasc when he returns      home.   1. ACUTE-ON-CHRONIC HEADACHE:  The patient complained of a headache      with intermittent double vision during the hospitalization.      Otherwise, he appeared to be neurologically intact.  However, a CT      scan of the head was ordered for evaluation, and it was completely      normal.  Of note, the patient was evaluated with an MRI of the      brain approximately four weeks ago on December 20, 2006.  The MRI of      the brain at that time was completely normal.   LABS PENDING:  Lyme titer.   DISCHARGE LABORATORIES:  Hemoglobin A1C 6.6, sed rate 37, rheumatoid  factor less than 20.  Sodium 138, potassium 4.4, chloride 110, CO2 27,  glucose 118, BUN 20, creatinine 0.93, total bilirubin 0.6, alkaline  phosphatase 65, SGOT 32, SGPT 52, total protein 6.6, albumin 2.9,  calcium 8.4.  WBC 7.5, hemoglobin 9.6, platelets 289.      Rexene Alberts, M.D.  Electronically Signed     DF/MEDQ  D:  01/15/2007  T:  01/15/2007  Job:  [id number]   cc:   Barbette Merino, M.D.   Rudell Cobb. [name], M.D.  Fax: [phone]

## 2010-07-08 NOTE — Assessment & Plan Note (Signed)
[facility]   NAME:  [name], Adrian              ACCOUNT NO.:  [id number]   MEDICAL RECORD NO.:  [id number]      DATE OF BIRTH:  [birth year]   PHYSICIAN:  Epifania Gore. Nils Pyle, M.D. VISIT DATE:  06/23/2007                                   OFFICE VISIT   SUBJECTIVE:  [name] is a 60 year old man who we initially saw a  week ago for [name] I diabetic foot ulcers involving the heels.  We  treated him with an offloading heel in sandal and local hygiene.  He  returns for followup.  He reports that there has been no pain, no  drainage.  He continues to be ambulatory.  There has been no fever.   OBJECTIVE:  Blood pressure 125/72, respirations 16, pulse rate 92,  temperature 98.3, capillary blood glucose 100 to 200 mg percent.  He is  accompanied by a sister.  Inspection of the lower extremity shows that  there is trace edema.  Wound #1 on the left heel has a mature darkened  eschar with no drainage.  There is no disruption.  The blister is  completely intact on the right heel.  There is an area of discoloration  consistent with previous abdominal hemorrhage, but there is no draining  and no open wound.   ASSESSMENT:  These wounds could be characterized as unstable pressure  ulcers or  Wagner 1 ulcer bilaterally of the heels.   PLAN:  We will continue to offload him utilizing a offloading heel  healing sandal.  We have instructed the patient in local hygiene and in  the wearing of a clean cotton sock.  We anticipate that the blister may  undergo autolysis and draining which time we will see the patient semi-  emergently.  Otherwise, we will reevaluate him in 2 weeks p.r.n.      Harold A. Nils Pyle, M.D.  Electronically Signed     HAN/MEDQ  D:  06/23/2007  T:  06/24/2007  Job:  [id number]

## 2010-07-08 NOTE — Op Note (Signed)
NAMECHRISTIPHER, [name] NO.:  [id number]   MEDICAL RECORD NO.:  [id number]          PATIENT TYPE:  OUT   LOCATION:  OMED                         [facility]ACILITY:  [facility]   PHYSICIAN:  Rudell Cobb. [name], M.D. DATE OF BIRTH:  Jun 24, 1950   DATE OF PROCEDURE:  07/05/2007  DATE OF DISCHARGE:                               OPERATIVE REPORT   PROCEDURE:  Left posterior iliac crest bone marrow biopsy and aspirate.   [name] was brought to the short stay unit.  He had an [employer] of Anesthesiology score of 1. His Mallampati oral score was also  1.   We had an IV placed without difficulty.  He was then placed onto his  right side.  He received a total of 5 mg of Versed and 20 mg Demerol for  sedation.   The left posterior iliac crest region was prepped and draped in a  sterile fashion. 10 mL of 2% lidocaine was infiltrated under the skin  down to the periosteum. A #11 scalpel was used to make an incision into  the skin.  Two bone marrow aspirates were obtained without difficulty.  There were adequate particles per the technologist.   We then proceeded with a bone marrow biopsy core.  We got an excellent  specimen.   [name] tolerated the procedure well.  There were no complications.      Rudell Cobb. [name], M.D.  Electronically Signed     PRE/MEDQ  D:  07/05/2007  T:  07/05/2007  Job:  [id number]

## 2010-07-08 NOTE — Assessment & Plan Note (Signed)
[facility]   NAME:  [name], Adrian              ACCOUNT NO.:  [id number]   MEDICAL RECORD NO.:  [id number]      DATE OF BIRTH:  Jan 03, 1951   PHYSICIAN:  Epifania Gore. Nils Pyle, M.D. VISIT DATE:  08/04/2007                                   OFFICE VISIT   SUBJECTIVE:  [name] is a 60 year old man whom we have followed for  Wagner I diabetic foot ulcers of both heels.  He has had intact  mummified areas that we have treated with offloading.  There had been no  rupture, no drainage, and no fever.  He has had some pain on the right  heel.  He continues to be ambulatory.   OBJECTIVE:  Blood pressure is 113/75, respirations 16, pulse rate 79,  and temperature is 98.3.  Capillary blood glucose is 156%.  Inspection  of the right heel shows a well-adherent dark callus which appears to be  in a reabsorbed blister.  There is no drainage.  There is no hyperemia.  There is pain.  There is some tenderness to deep palpation.  Both feet  are warm.  Pulses are present.  Capillary refill is normal.  There is no  evidence of concurrent ischemia.  On the left heel, there is a soft area  of fluctuance which upon excision was a blister filled with  serosanguineous fluid.  This was drained off disclosing underlying  cornified epithelium.  The wound edges were tapered.  There is no  extension into the subcutaneous tissue.  There is no abscess or malodor.   ASSESSMENT:  [name] I diabetic foot ulcers of the heel.   PLAN:  We will continue the offloading heal and sandal.  We will apply  Iodosorb gel every 2-3 days to the denuded blister on the left heel.  We  will re-evaluate the patient in 2 weeks p.r.n.      Harold A. Nils Pyle, M.D.  Electronically Signed     HAN/MEDQ  D:  08/04/2007  T:  08/05/2007  Job:  [id number]

## 2010-07-08 NOTE — Assessment & Plan Note (Signed)
[facility]   NAME:  [name], [name] NO.:  [id number]   MEDICAL RECORD NO.:  [id number]      DATE OF BIRTH:  [birth year]   PHYSICIAN:  Ricard Dillon, M.D. VISIT DATE:  10/17/2007                                   OFFICE VISIT   [name] returns today in followup for his [name] grade 1 diabetic  foot ulcers of both heels.  The one on the right was resolved last  month.  He continues to do basic care including antiseptic soap washes,  Polysporin, and offloading this in a healing sandal.   On examination, he is not unwell.  He thinks the wound is healed.  Indeed on examination, the area in question has completely healed.  The  healing is in the state of deep pigmentation; however, I think this  looks completely viable.   IMPRESSION:  Resolved bilateral Wagner grade 1 diabetic foot ulcers.  I  have given him permission to go back in his usual footwear.  He is in  the process of getting diabetic footwear and understands the importance  of this.  I have discharged him at this point.           ______________________________  Ricard Dillon, M.D.     MGR/MEDQ  D:  10/17/2007  T:  10/17/2007  Job:  [id number]

## 2010-07-08 NOTE — Assessment & Plan Note (Signed)
[facility]   NAME:  [name], [name]              ACCOUNT NO.:  [id number]   MEDICAL RECORD NO.:  [id number]      DATE OF BIRTH:  Jun 14, 1950   PHYSICIAN:  Epifania Gore. Nils [name], M.D. VISIT DATE:  06/16/2007                                   OFFICE VISIT   SUBJECTIVE:  [name] is a 60 year old man referred by Dr. __________  for evaluation of an ulceration on the left foot.   IMPRESSION:  Probable Wagner 1 diabetic foot ulcer with blister with  intact skin.   RECOMMENDATION:  We will leave the blister intact and provide the  patient with an off-loading healing sandal.  We will perform serial  exams to assess the need for further debridement.  The patient is  encouraged to continue all of his medications per his primary care  physician, [name] and to continue under the care of Dr. __________ .   SUBJECTIVE:  The patient is a 60 year old man who has been actively  treated for multiple myeloma.  He was seen several days ago in the  Oncology Clinic complaining of pain in his feet, and upon exam, blisters  were noted, particularly on the left heel.  The patient was referred to  the [facility] for evaluation.   PAST MEDICAL HISTORY:  Remarkable for no known allergies.   CURRENT MEDICATIONS:  1. Oxycodone 20 mg b.i.d..  2. Tramadol 50 mg every 4 hours.  3. Gabapentin 600 mg t.i.d.  4. Zovirax 800 mg daily.  5. Megace ES.  6. Flomax  0.4 mg daily.  7. Norvasc 10 mg daily.  8. Protonix 40 mg daily.  9. Glucotrol 10 mg daily.  10.Zestoretic 20/25 daily.  11.Metoclopramide 10 mg.  12.Meloxicam 0.5 mg daily.   PAST SURGICAL HISTORY:  Previous surgeries included a open reduction and  fixation of the jaw.  He has had a left knee arthroscopy.   Other medical conditions include hypertension, spondylosis, herpetic  encephalitis, migratory arthralgias, and myalgias.   His family history is positive for high blood pressure, stroke, cancer,  and heart  attack.   Socially, he is separated.  He lives with his sister in [place].  He  is originally from [place] [facility] [place].  He has 2 children that live  locally, one that lives remotely.  He is medically disabled.  He was  formerly employed as a Training and development officer.   REVIEW OF SYSTEMS:  On review of systems, he has extreme exercise  intolerance.  He is unable to walk a half block.  He is unable to  negotiate stairs.  His appetite is good.  He has not lost weight over  the last year.  He has never smoked.  He denies cough.  There have been  no visual changes and no transient paralysis.  He specifically denies  chest pain or angina pectoris.  There are no bowel or bladder  complaints.  There is no syncope.  No heat or cold intolerance.  No  polydipsia or polyuria.  The remainder of the review of systems is  negative.   PHYSICAL EXAMINATION:  GENERAL:  He is alert, oriented, in no acute  distress.  He is accompanied by his sister.  VITAL SIGNS:  Blood pressure is 114/72, respirations 18, pulse  rate 95,  temperature 98.3, and capillary blood glucose is 123 mg percent.  HEENT:  Clear.  NECK:  Supple.  Trachea is midline.  Thyroid is nonpalpable.  LUNGS:  Clear.  HEART:  Sounds are normal.  ABDOMEN:  Soft with moderate tenderness along the T10 dermatome.  The  patient says this is a sequelae from his viral neuritis.  EXTREMITIES:  Remarkable for bilateral 2+ edema.  There is a large  blister on his left heel, which is intact and measures 3.4 cm in its  broadest diameter.  There is a halo of erythema, but there is no  evidence of ascending cellulitis or lymphangitis.  There are no wounds  on the right heel.  The pedal pulses are bilaterally palpable.  There is  no evidence of concurrent ischemia.  The patient is insensate to the  Semmes-Weinstein filament.   DISCUSSION:  [name] is a 60 year old man who undoubtedly developed  these blisters related to his insensate extremities.  We will leave this   blister intact on the left heel and we will place him in bilateral off-  loading healing sandals.  We will inspect him weekly.  Hopefully, this  blister will remain intact allowing for reepithelialization for the  underlying wound.  If the blister ruptures and drains, we will debride  it appropriately and we will institute an open form of management.  We  have explained this approach to the patient in terms that he seems to  understand.  He expresses his gratitude for having been seen in the  clinic and indicates that he will be compliant.  We will reevaluate him  in 1 week p.r.n.      [name], M.D.  Electronically Signed     HAN/MEDQ  D:  06/16/2007  T:  06/17/2007  Job:  [id number]

## 2010-07-08 NOTE — Discharge Summary (Signed)
[name], [name] NO.:  [id number]   MEDICAL RECORD NO.:  [id number]          PATIENT TYPE:  INP   LOCATION:  1318                         FACILITY:  [place]:  Leana Gamer, MDDATE OF BIRTH:  [birth year]   DATE OF ADMISSION:  03/14/2007  DATE OF DISCHARGE:  03/18/2007                               DISCHARGE SUMMARY   DISCHARGE DISPOSITION:  Home.   FINAL DISCHARGE DIAGNOSES:  1. Gastroenteritis resolved.  2. Possibly esophageal stricture versus gastroparesis.  Workup to be      done as an outpatient.  3. Diabetes type 2.  4. Multiple myeloma.  5. Hypertension.  6. History of chronic pain.  7. History of anemia of chronic disease.  8. Constipation.  9. Diabetic neuropathy.   DISCHARGE MEDICATIONS:  1. Meloxicam 15 mg p.o. daily.  2. Glipizide 5 mg p.o. daily.  3. Lisinopril.  4. Hydrochlorothiazide 20/25 mg p.o. daily.  5. Flomax 0.4 mg p.o. daily.  6. OxyContin 20 mg p.o. b.i.d.  7. Tramadol 50 mg p.o. q.4 h. p.r.n.  8. Neurontin 300 mg p.o. t.i.d.  9. Flexeril 10 mg p.o. q.8 h. p.r.n.  10.MiraLax 17 g in 8 oz fluid p.o. daily.  11.Vicodin 5 mg p.o. q.4 h. p.r.n.  12.Reglan 10 mg p.o. a.c. and h.s.  13.Protonix 40 mg p.o. daily.   CONSULTANTS:  None.   PROCEDURES:  None.   DIAGNOSTIC STUDIES:  CT of the abdomen and pelvis without contrast shows  the following:  Mildly dilated __________ uncertain significance.  There  is no definite wall thickening or pericolic inflammatory changes to  suggest colitis.  Solid parenchymal organs appear unremarkable.   IMPRESSION:  1. Small bilateral pleural effusions and bibasilar atelectasis.  2. Mesenteric retroperitoneal adenopathy. Pelvis shows distended      bladder.  3. No acute pelvic findings, masses or adenopathy.  Bilateral hip and      SI joint degenerative changes.  The SI joint appears fused .  4. Code status full code.   ALLERGIES:  IODINE.   PRIMARY CARE PHYSICIAN:  Dr.  Barbette Merino.   CHIEF COMPLAINT:  Abdominal pain.   HISTORY OF PRESENT ILLNESS:  Please see the H&P for details of the HPI.   In short this is a patient with multiple myeloma, diabetes who presents  to the emergency room with complaints of increasing abdominal pain along  with nausea, vomiting, and diarrhea for three days.   HOSPITAL COURSE:  1. Gastroenteritis.  The patient presented to the emergency room with      symptoms of gastroenteritis.  He was admitted and given supportive      care with aggressive fluid resuscitation.  The patient was then      started on clear liquids and advanced as tolerated to where he is      tolerating carbohydrate modified, heart healthy diet without any      difficulty.  The patient complained of feeling as though his food      was sticking in his esophagus.  I discussed with the patient that  we should embark upon a workup including possible upper GI and      possible endoscopy depending on findings.  The patient states that      in light of the fact that he was able to eat, he did not want to      pursue the workup while hospitalized and has asked for the name of      a physician for referral.  I have given the patient the name of Dr.      Juanita Craver for referral for a GI workup as an outpatient.      However, ultimately that will be left to the discretion of his      primary care physician, Dr. Jonelle Sidle.  In light of the patient having      diabetes type 2 and possible gastroparesis, I have empirically      started the patient on Reglan to assist with peristalsis and the      sensation of food sticking.  However, this is not a definitive      treatment and needs to be further modified once the GI workup has      been done as an outpatient.  2. Diabetes type 2.  The patient has been continued on his usual      medication.  His blood sugars have been well-controlled while      hospitalized.  3. Chronic pain associated with his myeloma.  The  patient is to      continue on his pain medications.  States that he is under the care      of the oncologist for his pain.  However, he feels as though his      pain has never been adequately controlled.  There was some      discussion with the patient about the value of having palliative      consult, and the patient did not want to pursue while hospitalized.      States that he will speak with his oncologist regarding this.  4. Hypertension.  Blood pressures are well-controlled, and the patient      was resumed on his usual medication.  5. Constipation.  The patient is on MiraLax and is to continue on      that.  Given the amount of narcotics that the patient is taking for      his pain control, this may need to be addressed by his primary care      physician in the outpatient setting that he be on more aggressive      regimen for constipation.  At this point the patient is eating and      tolerating his diet without any difficulty.  His acute complaints      have been resolved, and the patient will be discharged home to      continue his care under the care of his oncologist for his myeloma.      He is to be followed by his primary care physician in 3-5 days.      The patient is restricted to a carbohydrate modified, heart healthy      diet.  However, his physical activity is not restricted at this      time.     Leana Gamer, MD  Electronically Signed    MAM/MEDQ  D:  03/18/2007  T:  03/18/2007  Job:  [id number]

## 2010-07-08 NOTE — Assessment & Plan Note (Signed)
[facility]   NAME:  [name], [name] NO.:  [id number]   MEDICAL RECORD NO.:  [id number]      DATE OF BIRTH:  Sep 26, 1950   PHYSICIAN:  Ricard Dillon, M.D. VISIT DATE:  09/19/2007                                   OFFICE VISIT   [name] returns today in followup for his [name] grade 1 diabetic  foot ulcers of both heels.  I declared the one on the right resolved  earlier this month.  He is continuing to do antiseptic soap washes,  Polysporin, and offloading this in a healing sandal.   On examination, he is afebrile.  The area on the left heel was covered  in callus and I removed all of the callus from the area in question.  Underneath this, there was no open wound.  The area where the wound is  previously has a healed skin on it; however, it is still less vibrant  looking than what I would assume his normal skin looks like.  Therefore,  I am declaring this wound resolved.   IMPRESSION:  Wagner grade 1 diabetic foot ulcers.  The area on the left  heel had the callus removed as noted above.  There is no open wound.  I  have recommended a thick offloading pad such as I believe an adhesive.  I have written him prescriptions for diabetic footwear with custom  inserts.  I will see this area one more time in a month; however, I am  not anticipating any problem in this very compliant motivated patient.           ______________________________  Ricard Dillon, M.D.     MGR/MEDQ  D:  09/19/2007  T:  09/19/2007  Job:  [id number]

## 2010-07-08 NOTE — H&P (Signed)
[name], [name] NO.:  [id number]   MEDICAL RECORD NO.:  [id number]          PATIENT TYPE:  INP   LOCATION:  1318                         [facility]ACILITY:  [facility]   PHYSICIAN:  Jana Hakim, M.D. DATE OF BIRTH:  09/05/1950   DATE OF ADMISSION:  03/14/2007  DATE OF DISCHARGE:                              HISTORY & PHYSICAL   PRIMARY CARE PHYSICIAN:  Barbette Merino, M.D.   CHIEF COMPLAINT:  Abdominal pain.   HISTORY OF PRESENT ILLNESS:  This is a 60 year old man with a history of  multiple myeloma who presents to the emergency department with  complaints of increasing abdominal pain along with nausea, vomiting,  diarrhea for 3 days.  He denies having any fevers, chills.  He reports  being unable to eat and drink secondary to his symptoms.  He also  reports that food feels like it is getting stuck in his esophagus.  He  denies any hematemesis, hematochezia or melena.  The patient reports  having his last chemotherapy treatment 2 days ago.   He describes the pain in his abdomen as being in the lower abdomen area.  He rates the pain as being a 10/10 in intensity and the quality of the  pain is sharp and stabbing and intermittent.   PAST MEDICAL HISTORY:  Significant for:  1. Multiple myeloma, diagnosed in September 2008.  2. Type 2 diabetes mellitus.  3. Hypertension.  4. Anemia.  5. Chronic pain syndrome.   MEDICATIONS:  Include Hydrocodone/APAP, Tramadol, methocarbamol/aspirin,  Meloxicam, glipizide, lisinopril/hydrochlorothiazide, Flomax, OxyContin  and oxycodone/APAP p.r.n.   SOCIAL HISTORY:  The patient is a nonsmoker and a nondrinker.  He  reports quitting alcohol 2-1/2 years ago.   ALLERGIES:  IODINE.   FAMILY HISTORY:  Positive for hypertension, diabetes and hyperlipidemia.   REVIEW OF SYSTEMS:  Pertinents are mentioned above.  The patient denies  having any chest pain or shortness of breath.   PHYSICAL EXAMINATION:  FINDINGS:  This is a  well-nourished, well-  developed 60 year old male in discomfort, but no acute distress.  VITAL SIGNS:  Temperature 98.4, blood pressure 157/99, heart rate 106,  respirations 22, O2 saturation 97-98% on room air.  HEENT:  Examination normocephalic, atraumatic.  Pupils are equally  round, reactive to light.  Extraocular muscles are intact.  Funduscopic  benign.  Oropharynx is clear.  NECK:  Supple, full range of motion.  No thyromegaly, adenopathy or  jugular venous distention.  CARDIOVASCULAR:  Regular rate and rhythm with mild tachycardia.  No  murmurs, gallops or rubs.  LUNGS:  Clear to auscultation bilaterally.  ABDOMEN:  Positive bowel sounds, soft, mildly tender diffusely.  There  is no guarding or rebound.  There is no hepatosplenomegaly.  EXTREMITIES:  Without cyanosis, clubbing or edema.  NEUROLOGIC EXAMINATION:  The patient is alert and oriented x3.  He has  generalized weakness, but there are no focal deficits on examination.   LABORATORY STUDIES:  White blood cell count 19.6, hemoglobin 11.4,  hematocrit 34.8, MCV 76.3, platelets 77, neutrophils 77% and lymphocytes  12%.  Sodium 135, potassium 4.6, chloride 101, carbon dioxide 25,  BUN  37, creatinine 1.24, glucose 190, albumin 4.0, AST 34, ALT 27, lipase  27.  Urinalysis negative except for 250 urine glucose.  CT scan of the  abdomen reveals fluid in the colon without colitis, contracted  gallbladder, distended bladder and normal appendix.   ASSESSMENT:  A 60 year old male being admitted with:  1. Abdominal pain.  2. Nausea, vomiting, diarrhea.  3. Dehydration.  4. Hyperglycemia.  5. Mild microcytic anemia.  6. Type 2 diabetes mellitus.  7. Dysphasia.  8. Multiple myeloma.   PLAN:  The patient will be placed on IV fluids and made n.p.o. for bowel  rest.  Antiemetic therapy has been ordered along with pain control  therapy.  GI prophylaxis has also been ordered, and the patient will be  placed in TED hose for DVT  prophylaxis secondary to his low platelets.  Stool studies will be sent secondary to his diarrhea.  IV antibiotic  therapy of Cipro and Flagyl have been ordered.  His symptoms may be  secondary to infection.  However, this also might be secondary to  mucositis from his chemotherapy.  A GI evaluation will be considered as  well.      Jana Hakim, M.D.  Electronically Signed     HJ/MEDQ  D:  03/14/2007  T:  03/15/2007  Job:  [id number]   cc:   Barbette Merino, M.D.

## 2010-07-08 NOTE — Discharge Summary (Signed)
NAMEFRIEDRICH, [name] NO.:  [id number]   MEDICAL RECORD NO.:  [id number]          PATIENT TYPE:  INP   LOCATION:  1304                         FACILITY:  [facility]   PHYSICIAN:  Rudell Cobb. [name], M.D. DATE OF BIRTH:  Jul 05, 1950   DATE OF ADMISSION:  12/19/2006  DATE OF DISCHARGE:  12/25/2006                               DISCHARGE SUMMARY   DISCHARGE DIAGNOSES:  1. Headache.  2. Hypertension.  3. Diabetes mellitus.  4. Anemia.  5. Chronic bone pain.  6. History of drug abuse.   HISTORY:  The patient is a 60 year old African-American gentleman with a  diagnosis of IgG Cappa, multiple myeloma made by bone marrow biopsy on  November 01, 2006, showing 22% plasma cells.  He does not had a  significant total IgG or serum light chain level and had negative bone  survey.  The patient was started on Velcade and Zometa on December 17, 2006.  On the day of admission he presented complaining of feeling weak  and dizzy with a severe headache.  He was evaluated in the [facility] emergency room which included a CT of the head which  was negative for any intracranial involvement or significant sinus  problems.  There were no bony lesions in the calvarium.   HOSPITAL COURSE:  The patient was admitted to the oncology service for  further management of his presenting symptoms.  For his hypertension, he  was continued on his Norvasc and lisinopril.  For his diabetes, he was  continued on his Glucotrol as well as covered with sliding scale  insulin, sensitive scale.  He was empirically covered with IV  antibiotics, specifically Primaxin 500 mg IV q.6 hours.  For his pain he  was treated with Flexeril, Ultram, ibuprofen, and Darvocet.  He was  continued on his Flomax.  Initially he had a Foley catheter placed.  This was discontinued and the patient had good urine output on the day  of discharge.  His headache had resolved.  The headache was not  associated with  any photophobia, nausea and vomiting, or any other  visual changes.   DIET:  Diabetic diet.   ACTIVITY:  Increase activity slowly.   DISCHARGE MEDICATIONS:  1. Norvasc 5 mg p.o. daily.  2. Glucotrol 10 mg p.o. daily.  3. Lisinopril 10 mg p.o. b.i.d.  4. Neurontin 100 mg p.o. t.i.d.  5. Flexeril 10 mg p.o. daily.  6. Ultram 50 mg p.o. q.4 hours p.r.n. pain.  7. MiraLax 17 grams in 8 ounces of water once or twice daily as needed      for constipation.  8. Flomax 0.4 mg p.o. daily.   FOLLOWUP:  The patient is to follow up with Dr. [name] and is to call  the office for an appointment.      Awilda Metro, PA.      Rudell Cobb. [name], M.D.  Electronically Signed    AJ/MEDQ  D:  12/25/2006  T:  12/26/2006  Job:  [id number]

## 2010-07-08 NOTE — H&P (Signed)
[name], [name] NO.:  [id number]   MEDICAL RECORD NO.:  [id number]          PATIENT TYPE:  INP   LOCATION:  1341                         FACILITY:  [facility]   PHYSICIAN:  Jana Hakim, M.D. DATE OF BIRTH:  03-05-1950   DATE OF ADMISSION:  01/11/2007  DATE OF DISCHARGE:                              HISTORY & PHYSICAL   PRIMARY CARE PHYSICIAN:  Barbette Merino, M.D.   CHIEF COMPLAINT:  Diffuse pain in joints and muscles.   HISTORY OF PRESENT ILLNESS:  This is a 59 year old male who presented to  the emergency department secondary to complaints of increased pain in  his joints and muscles.  He reports having severe pain for over a year  and reports having 10/10 pain this evening and reporting to the  emergency department.  The patient has a history of multiple myeloma and  has been evaluated by hematology/oncology, Dr. [name], and was seen as  well this evening by Dr. Julien Nordmann.  The patient denies having any fevers,  chills, chest pain or shortness of breath.  He does report having  fatigue.   The patient was seen as recently as November 11 by Dr. [name] and  referrals were being made for further evaluation by rheumatology at [facility], and the patient was to report to  that visit this week.   PAST MEDICAL HISTORY:  1. Hypertension.  2. Type 2 diabetes mellitus.  3. Multiple myeloma diagnosed in September 2008.   MEDICATIONS AT THIS TIME:  1. Norvasc 5 mg one p.o. daily.  2. Glucotrol 10 mg one p.o. daily.  3. Lisinopril 10 mg p.o. b.i.d.  4. Neurontin 100 mg one p.o. t.i.d.  5. Flexeril 10 mg p.o. daily.  6. Ultram 50 mg one p.o. q.4h. p.r.n. pain.  7. Flomax 0.4 mg one p.o. daily.  8. The patient also has MiraLax 17 g in 8 ounces of water b.i.d.      p.r.n. constipation.   The patient has no known drug allergies.   SOCIAL HISTORY:  The patient is a nonsmoker, nondrinker, and he denies  any illicit drug usage.   FAMILY HISTORY:  Positive for diabetes, hypertension and hyperlipidemia.   REVIEW OF SYSTEMS:  Pertinents are mentioned above.   PHYSICAL EXAMINATION FINDINGS:  This is a well-nourished, well-developed  60 year old male in discomfort but no acute distress.  VITAL SIGNS:  Temperature 98.6, blood pressure 136/87, heart rate 106,  respirations 18, O2 saturations are 98% on room air.  HEENT:  Normocephalic, atraumatic.  Pupils equally round, reactive to  light.  Extraocular muscles are intact.  Funduscopic benign.  Oropharynx  is clear.  NECK:  Supple with full range of motion.  No thyromegaly, adenopathy or  jugular venous distention.  CARDIOVASCULAR:  Regular rate and rhythm.  No murmurs, gallops or rubs.  LUNGS:  Clear to auscultation bilaterally.  ABDOMEN:  Positive bowel sounds, soft, nontender, nondistended.  EXTREMITIES:  Without cyanosis, clubbing or edema.  NEUROLOGIC:  Alert and oriented.  There were no focal deficits.   LABORATORY STUDIES:  White blood cell count 6.4, hemoglobin  10.7,  hematocrit 32.1, MCV 77.2, platelets 342, neutrophils 52% lymphocytes  32%.  Sodium 138, potassium 4.7, chloride 107, CO2 27, BUN 29,  creatinine 1.24, and glucose 164.  Urinalysis negative.   ASSESSMENT:  A 60 year old male being with:   1. Diffuse arthropathy and myalgias.  2. Chronic pain syndrome.  3. History of multiple myeloma.  4. Type 2 diabetes mellitus.  5. Hypertension.  6. Anemia.   PLAN:  The patient will be admitted for further evaluation and  treatment.  He will be placed on pain control therapy as needed.  A CPK  will be ordered along with an LDH level.  The patient will continue on  his regular medications at this time.  Sliding scale insulin coverage  has also been ordered as needed.  Further evaluation will ensue pending  results of the patient's studies.  Oncology will be consulted.      Jana Hakim, M.D.  Electronically Signed     HJ/MEDQ  D:  01/12/2007   T:  01/13/2007  Job:  [id number]   cc:   Barbette Merino, M.D.

## 2010-07-08 NOTE — Consult Note (Signed)
NAMEJOHNDANIEL, [name] NO.:  [id number]   MEDICAL RECORD NO.:  [id number]          PATIENT TYPE:  EMS   LOCATION:  ED                           FACILITY:  [facility]   PHYSICIAN:  Eilleen Kempf, MD  DATE OF BIRTH:  1950-08-07   DATE OF CONSULTATION:  01/11/2007  DATE OF DISCHARGE:                                 CONSULTATION   REASON FOR CONSULTATION:  A 60 year old African American male with  history of smoldering multiple myeloma, presenting with diffuse pain.   HISTORY:  [name] is a pleasant 60 year old African American male  with past medical history significant for hypertension, diabetes  mellitus and cocaine abuse.  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  The patient received one dose of  Velcade on December 17, 2006.  But, he could not tolerate the treatment  any further, and this treatment was stopped at that time.  The patient  had no evidence of bony disease from his multiple myeloma.  He also has  a history of polyarthralgia, and he was treated with Lyrica and Ultram,  as well as Percocet.  He presented to the emergency department tonight  complaining of diffuse pain all over his body.  The patient did not have  any specific location, but mentions that every bone in his body is  hurting.  He took his home pain medication, with no improvement.  He  came to the emergency department for evaluation.   He was last seen by Dr. Rudell Cobb. Ennever on January 04, 2007;  Dr.  [name] planned to refer him to rheumatology  at [facility]  for further evaluation.  The patient had no other significant  complaints, and the review of systems today showed he has no fever or  chills; no headache, no blurry or double vision.  He had no chest pain,  no shortness of breath, no cough and no hemoptysis.  No syncopal or  palpitations.  No nausea or vomiting.  No abdominal pain or  constipation.  No melena or  hematochezia.   PAST MEDICAL HISTORY:  As mentioned above; significant for:  1. Hypertension.  2. Diabetes mellitus.  3. Cocaine abuse.  4. Recent diagnosis of smoldering myeloma.   FAMILY HISTORY:  Noncontributory.   SOCIAL HISTORY:  He has a history of tobacco, alcohol and drug abuse.  He currently does not work.   ALLERGIES:  NO KNOWN DRUG ALLERGIES.   CURRENT HOME MEDICATIONS:  1. Norvasc.  2. Glucotrol.  3. Lisinopril.  4. Lyrica.  5. Flexeril.  6. Ultram.  7. Percocet.  8. MiraLax.  9. Flomax.   PHYSICAL EXAMINATION:  VITALS:  Unremarkable.  GENERAL:  Showed a pleasant 60 year old African American male.  No acute  distress.  HEENT:  Normocephalic and atraumatic.  Clear oropharynx.  NECK:  Supple.  No lymphadenopathy.  CHEST:  Clear to auscultation.  No wheezes or crackles.  CARDIOVASCULAR:  Normal S1 and S2.  No murmurs, rubs or gallops.  ABDOMEN:  Soft, nontender, nondistended.  No masses.  EXTREMITIES:  Show no edema.  LABORATORY DATA:  CBC and CMET performed today at the emergency  department showed:  White blood count 6.4, hemoglobin 10.7, hematocrit  32.1, platelets 342.  Sodium 138, potassium 4.7, glucose 164, BUN 29,  creatinine 1.24.  Calcium 9.3, albumin 3.5.   ASSESSMENT AND PLAN:  This is a pleasant 60 year old African American  male, who was recently diagnosed IgG GABA smoldering multiple myeloma,  with no bony lesions.  The patient also has a history of polyarthralgia.  He presented today with diffuse pain all over his body.  This is  unlikely to be related to his recent diagnosis of multiple myeloma, and  most likely is rheumatologic in origin.  I do not see any indication to  admit this patient to the oncology service; the patient may benefit  better from admission by his primary care physician with a rheumatology  evaluation.  I will be happy to help in the management of this patient  on an as-needed basis.      Eilleen Kempf, MD   Electronically Signed     MKM/MEDQ  D:  01/11/2007  T:  01/11/2007  Job:  [id number]

## 2010-07-08 NOTE — H&P (Signed)
[name], [name]              ACCOUNT NO.:  [id number]   MEDICAL RECORD NO.:  [id number]          PATIENT TYPE:  OBV   LOCATION:  5731                         FACILITY:  [facility]   PHYSICIAN:  Barbette Merino, M.D.      DATE OF BIRTH:  1950/11/20   DATE OF ADMISSION:  08/31/2006  DATE OF DISCHARGE:                              HISTORY & PHYSICAL   PRIMARY CARE PHYSICIAN:  Barbette Merino, M.D.   PRESENTING COMPLAINT:  Severe back and lower extremity pain.   HISTORY OF PRESENT ILLNESS:  [name] is a 60 year old African  American male with history of diabetes and hypertension who has been  experiencing bilateral lower extremity pain for the past 8 months.  The  pain has gradually worsened today; the patient is having difficulties  working.  He has had previous x-rays of his spine that shows spondylosis  with slight retrolisthesis of L4 and L5, that was in December 2007.  At  that time he hurt his back while bending.  Today, however, his pain was  so severe that he was in the emergency room.  During that period, he was  given some pain medication and asked to follow up in the office.  The  patient has been in emergency room at least 5 times since December 2007  for the same problem.  He has tried multiple medications including  Vicodin, ibuprofen, etc. with no relief.  He has had workup done as an  outpatient including CSR that was elevated at 21, ANA was negative.  The  only significant finding is normocytic anemia.  He has been diabetic.  His hemoglobin A1c as an outpatient was 6.5.  The patient is only able  to work with assistance.  He has been unable to do any activity due to  the pain.  The pain is shooting down both legs and now he is feeling  numb in both legs.   PAST MEDICAL HISTORY:  Significant for diabetes, hypertension as well as  the back pain since last year.   ALLERGIES:  He has no known drug allergies.   CURRENT MEDICATIONS:  1. Lisinopril 10 mg daily.  2. Norvasc  5 mg daily.  3. Hydrochlorothiazide 25 mg daily.  4. Vicodin 5/500 one by mouth every 6 hours as needed.  5. Glipizide 10 mg daily.  6. Metformin 500 mg daily.  7. Ibuprofen 800 mg by mouth 3 times a day.   SOCIAL HISTORY:  The patient is married.  He quit tobacco about 1 year  ago and alcohol about 9 months ago.   FAMILY HISTORY:  Significant for diabetes, hypertension and  dyslipidemia.   REVIEW OF SYSTEMS:  Significant only for feeling cold on and off.  Also,  deep bone pains as HPI.  Otherwise the rest is as per HPI.   On exam today, the patient's temperature is 98.1, blood pressure 146/90,  pulse 97, respiratory rate of 13, sat 95% on room air.  Generally he is  awake, alert, oriented but in mild distress due to pain.  The patient  held onto his lower extremities bilaterally.  HEENT:  PERL.  EOMI.  NECK:  Supple. No JVD.  No lymphadenopathy.  RESPIRATORY:  He has good air entry bilaterally.  No wheezes.  No rales.  CARDIOVASCULAR:  The patient has S1, S2.  No murmurs.  ABDOMEN:  Soft, nontender with positive bowel sounds.  EXTREMITIES:  No edema, cyanosis or clubbing.  NEURO:  Cranial nerves II through XII were intact.  He has good sensory  with paresthesia of lower extremities bilaterally.  Normal motor tone,  5/5 bilaterally.  Reflexes seem to be hyperreflexic, 3+ bilaterally.  Straight leg raising test is positive bilaterally.   Labs are currently pending.   ASSESSMENT:  This is a 60 year old gentleman with past history of back  injury and previous history of L4-L5 disease presenting with severe  lower extremity pain, paresthesias and numbness.  Differentials are  varied.  This could reflect a lumbar disease with some nerve  compression.  It could also be a manifestation of diabetic neuropathy.  In either case, the patient is unable to function effectively at home  and will probably need to bring him in for pain control and further  testing.   PLAN:  1. Sciatica.   Will admit the patient with a diagnosis of sciatic for      at least 23 hour observation.  Will aim at adequate pain control.  Check MRI of his L-spine to rule out  any nerve compression.  If that is negative, chances this may end up  being diabetic neuropathy. We may have start him on some Neurontin or  Lyrica to hopefully help him with some pain.  In the meantime, PT/OT  will also be ordered.  1. Diabetes.  The patient's recent hemoglobin A1c 6.5.  He has been      controlled on glipizide and metformin.  Will use sliding scale      insulin in the hospital with his metformin.  I intend to give him      some steroids for possible sciatica and this may alter his diabetic      control but it will be a very short course.  2. Hypertension.  His blood pressure seems to be controlled on his      current regimen.  I will maintain him on that.  3. Anemia.  He has normocytic anemia with a hemoglobin about 11.  Will      check anemia panel specifically focus of B 12 level and depending      on the level will plan treatment. Otherwise further treatment will      depend on his response in the hospital.      Barbette Merino, M.D.  Electronically Signed     LG/MEDQ  D:  08/31/2006  T:  08/31/2006  Job:  [id number]

## 2010-07-08 NOTE — Op Note (Signed)
NAMESANTA, GALLIPEAU NO.:  [id number]   MEDICAL RECORD NO.:  [id number]          PATIENT TYPE:  OUT   LOCATION:  OMED                         FACILITY:  [facility]   PHYSICIAN:  Rudell Cobb. [name], M.D. DATE OF BIRTH:  1950-08-27   DATE OF PROCEDURE:  11/08/2006  DATE OF DISCHARGE:                               OPERATIVE REPORT   PROCEDURE:  Left posterior iliac crest bone marrow biopsy and aspirate.   Mr. Dargenio was brought to short-stay unit.  He had an IV placed into  his right hand.   He was then placed onto his right side.   He received a total of 5 mg of Versed and 50 mg of Demerol for IV  sedation.   The left posterior iliac crest region was prepped and draped in a  sterile fashion.  Eight mL of 2% lidocaine was infiltrated under the  skin and down to periosteum.   An #11 scalpel was used to make an incision into the skin.  Two bone  marrow aspirates were obtained without difficulty.   A second incision was made into the skin.  An excellent bone marrow  biopsy core was obtained without difficulty.   There were no complications.  The patient tolerated the procedure quite  nicely.      Rudell Cobb. [name], M.D.  Electronically Signed     PRE/MEDQ  D:  11/08/2006  T:  11/08/2006  Job:  [id number]

## 2010-07-08 NOTE — Discharge Summary (Signed)
[name], [name] NO.:  [id number]   MEDICAL RECORD NO.:  [id number]          PATIENT TYPE:  INP   LOCATION:  1427                         FACILITY:  [facility]   PHYSICIAN:  Edythe Lynn, M.D.       DATE OF BIRTH:  [birth year]   DATE OF ADMISSION:  04/05/2007  DATE OF DISCHARGE:  04/19/2007                               DISCHARGE SUMMARY   PRIMARY CARE PHYSICIAN:  Dr. Jonelle Sidle.   DISCHARGE DIAGNOSIS:  For a complete list of discharge diagnosis, please  refer to the previously dictated discharge summary done by Dr. Caryn Section.  New diagnosis:  1. CNS zoster.   DISCHARGE MEDICATIONS:  1. Protonix 40 mg daily.  2. OxyContin 14 mg twice a day.  3. Flomax 0.4 mg at bedtime.  4. Methocarbamol 500 mg twice a day.  5. Ibuprofen 400 mg 3 times a day.  6. Glipizide 5 mg daily.  7. Norvasc 10 mg daily.  8. Lisinopril/HCTZ 20/25 mg daily.  9. Valtrex 1000 mg 3 times a day for 3 weeks.  10.Neurontin 600 mg 3 times a day.  11.Imodium as needed for diarrhea.  12.Dilaudid 2 mg by mouth every 8 hours as needed for severe pain   CONDITION ON DISCHARGE:  Mr. Blaker is discharged in fair condition. He  is set up with home health physical therapy, occupational therapy,  registered nurse, a walker and a commode. He will be discharged under  the care of his sister. The patient is scheduled to follow up with Dr.  Jonelle Sidle on Thursday April 21, 2007 at 3:30 p.m. Ongoing checks of his  BMET and CBC are advised as long as the patient is taking high doses of  Valtrex.   PROCEDURES DURING THIS ADMISSION:  For a complete list of procedures,  refer to previously dictated discharge summary done by Dr. Caryn Section. To  those procedures add:  1. Fluoroscopy guided lumbar puncture.   CONSULTATIONS:  For complete list of consultation refer to the  previously dictated discharge summary done by Dr. Caryn Section. To those  consultations add consultation:  1. Dr. Michel Bickers with infectious diseases.   HISTORY OF PRESENT ILLNESS:  Refer to the previously done dictations.   HOSPITAL COURSE:  Covering the period from February 10 until April 10, 2007, refer to the previously dictated discharge summary done by Dr.  Caryn Section. Hospital course covering the period from February 16 until  February 29, 2009. during this period of time, Mr. Reisenauer has continued  to have significant malaise, diarrhea and recurrent urinary retention.  We felt that Mr. Ehrgott might be suffering from CNS zoster.  For this  reason on April 13, 2007, the patient underwent a lumbar puncture  under fluoroscopy. The analysis of the cerebrospinal fluid revealed very  high content of protein and the presence of lymphocytes in the fluid.  We have asked for a VZV PCR but unfortunately the lab has not been able  to perform the study due to mixing up of the specimens.  In any case,  Mr. Galayda was seen by the infectious disease consultant on April 14, 2007 and he was started on high doses of oral Valtrex.  This  treatment was dictated by a lack of intravenous acyclovir throughout our  nation. On high doses oral Valtrex, Mr. Karwowski has continued to  improve.  His functional status has improved, his diarrhea has been  slowing and his urinary retention has resolved. Mr. Forseth still has  some visual changes and headaches as well as right-sided flank pain but  overall he is doing much better.  The plan of care involves treatment  with Valtrex orally for 3 weeks total and symptomatic treatment by  increasing the OxyContin and providing Dilaudid as needed for  breakthrough pain.  Mr. Bayerl has also been started on Neurontin which  has been up titrated.  The patient was seen by the physical therapist  and occupational therapist and it was felt that he would benefit from  home health ongoing treatment. He is also to be provided with a walker  and a bedside commode at home.  The patient's primary oncologist, Dr.  [name],  has been contacted and alerted about the findings of the lumbar  puncture on April 14, 2007.      Edythe Lynn, M.D.  Electronically Signed     SL/MEDQ  D:  04/19/2007  T:  04/19/2007  Job:  [id number]   cc:   Lillette Boxer. Dahlstedt, M.D.  [phone]   Barbette Merino, M.D.   Rudell Cobb. [name], M.D.  Fax: [phone]

## 2010-07-08 NOTE — Consult Note (Signed)
NAMEELIANO, SRADER NO.:  [id number]   MEDICAL RECORD NO.:  [id number]          PATIENT TYPE:  INP   LOCATION:  [id number]                         FACILITY:  [facility]   PHYSICIAN:  Dahlia Bailiff, MD    DATE OF BIRTH:  09-26-1950   DATE OF CONSULTATION:  09/06/2006  DATE OF DISCHARGE:                                 CONSULTATION   REASON FOR CONSULTATION:  Bilateral knee pain.   HISTORY:  The patient is a 60 year old African American male with long-  standing bilateral lower extremity complaints.  He states it has been  going on for about 9 months, and he describes it as muscle aches.  He  has been to the emergency room about 5 times with similar complaints.  This last time, he said pain was so severe that he was admitted.  As a  result of his admission he had a workup, which included an MI of his  back, and today bilateral lower extremity MRIs.  The right MRI showed a  posterior horn medial meniscal tear, and as a result ortho consultation  was requested.  There was also a question as to whether or not this may  be multiple myeloma, and a bone scan, as well as a skeletal survey was  also ordered.  During the course of the patient's hospitalization  workup, he has still been complaining of significant knee pain.  He is  having difficulty walking because of this pain.   His past medical history, surgical, family and social history is  outlined in the initial H&P that was provided.  I have rereviewed it,  and he has got a history of hypertension, diabetes, arthritis.  He is a  former drinker, former drug abuser, which included marijuana and  cocaine, and there is some question as to whether or not he is still  using these.   ALLERGIES:  He has no known drug allergies.   CLINICAL EXAM:  He complains of bilaterally anterior knee pain.  There  is no significant effusion or pain.  He has reproduction of pain with  circumduction test.  The knee is grossly stable to varus  and valgus, and  stress testing at zero and 30 degrees, and a Lachman is negative.   He is grossly neurologically intact.  Negative straight-leg raise test.  No evidence of any nerve tension sign.  He has no foot and ankle pain  bilaterally.   The MRIs were reviewed.  There is a oblique tear of the posterior horn  of the medial meniscus.  This is all on the right side.   RECOMMENDATIONS:  At this point in time, I will have the ortho tech  apply a knee immobilizer, and I will have the patient follow up with my  partner and Dr. Veverly Fells as an outpatient to discuss possible treatment  options, which would include an arthroscopy.      Dahlia Bailiff, MD  Electronically Signed     DDB/MEDQ  D:  09/06/2006  T:  09/06/2006  Job:  [id number]

## 2010-07-08 NOTE — Discharge Summary (Signed)
[name], [name]              ACCOUNT NO.:  [id number]   MEDICAL RECORD NO.:  [id number]          PATIENT TYPE:  INP   LOCATION:  [id number]                         FACILITY:  [facility]   PHYSICIAN:  Hind I Elsaid, MD      DATE OF BIRTH:  [birth year]   DATE OF ADMISSION:  08/31/2006  DATE OF DISCHARGE:  09/07/2006                               DISCHARGE SUMMARY   PRIMARY CARE PHYSICIAN:  Dr. Jonelle Sidle   DISCHARGE DIAGNOSES:  1. Anemia, could be secondary to cocaine-induced bone marrow toxicity      oriron deficiency anemia.  2. Acute renal failure, resolved.  3. Diabetes mellitus with hemoglobin C1 of 7.1.  4. Bilateral knee pain secondary to posterior meniscal tear of the      right knee.  5. Back pain secondary to sciatica.  6. Hypertension.  7. Cocaine abuse and opiate abuse where patient is still denying.   DISCHARGE MEDICATIONS:  1. Norvasc 10 mg p.o. daily.  2. Aspirin 81 mg p.o. daily.  3. Glucotrol 10 mg p.o. q.a.m.  4. Lisinopril 10 mg p.o. b.i.d.  5. Protonix 40 mg p.o. daily.  6. Neurontin 100 mg p.o. t.i.d.  7. Flexeril 10 mg p.o. daily.  8. Tramadol/acetaminophen one tab p.o. q.6 hours for two weeks.   CONSULTATIONS:  1. Hematology consulted for the anemia.  2. Orthopedics consulted for the lower extremity and back pain.   HISTORY OF PRESENT ILLNESS:  A 60 year old African-American male,  history of diabetes, hypertension; he had been experiencing bilateral  lower extremity pain for the past eight months, pain getting worse,  admitted for further evaluation of the lower extremity pain.   PROCEDURES:  1. MRI of the L spine showed diffuse heterogenous metal signal due to      anemia of smoking, right foraminal disc protrusion at L4-L5, likely      affecting the right L4 nerve root,  central nerve with central      disc protrusion L5-S1 may be affecting the left S1 nerve.  2. Right knee x-ray:  Chondromalacia patellae.  3. MRI of the brain was negative.  4. Bone x-ray  survey for evaluation of multiple myeloma was negative.  5. MRI of the lower extremity:  Degenerative change, but the knee most      notable in the posterior aspect of the medial compartments, very      mild subchondral edema seen in the posterior medial tibial plateau      with oblique tear of the posterior horn of the medial meniscus      reaching the meniscal undersurface, mild chondromalacia patellae      and a small Baker's cyst.  6. Bone scan was completely normal.   PROBLEM:  1. Anemia.  Patient developed anemia with hemoglobin at 7.7 status      post two units of blood transfusion.  Anemia workup was with normal      ferritin level and retic count of 0.5% with normal vitamin B12 of      435, total iron-binding capacity 293, free T level was 337.  As the  patient came with acute renal failure, bone pain and anemia,      differential diagnosis was multiple myeloma, work up was done which      was nonsignificant.  Bone scan x-ray survey of the bone was done      which was negative.  Hematology was seeing the patient for consult      where Dr. [name] evaluated the patient and suspected marrow      toxicity secondary to cocaine and there is no evidence of multiple      myeloma at this time.  Also, he think it can be iron deficiency      anemia despite normal ferritin and he recommended to see the      patient as outpatient for further evaluation of the anemia.  Guaiac      was negative during hospitalization and as  long as the patient has      never had colonoscopy in the past, will arrange for the patient to      get colonoscopy as outpatient appointement was given at [facility]      gastroenterology on 29 th of july and already discussed with the      patient.  2. Bilateral lower extremity weakness and especially right knee pain.      MRI of the back was documented evidence of sciatica with entrapment      of the L5 and S1.  Will continue with conservative management and       pain medication, bedrest and exercise.  Further recommendations      will be forthcoming if above recommendation is not helpful.      Patient already arranged for orthopedics to follow with Dr. Lynnette Caffey      as outpatient for further evaluation if patient need surgery.  3. Right knee meniscal tear.  Patient seen in consultation with      orthopedics and follow up as outpatient and apply right knee      immobilization for now.  4. Hypertension.  Patient to continue on lisinopril 10 mg p.o. b.i.d.      and Norvasc 5 mg p.o. b.i.d.  Further recommendation as per his      primary care physician.  5. Diabetes mellitus.  Hemoglobin C1 7.1.  Will continue on glipizide      and metformin.  6. Acute renal failure, resolved.  7. History of cocaine abuse with positive drug screens in the urine      for opioids and cocaine. Patient continues to deny any history of      cocaine abuse.   DISPOSITION:  1. We felt that the patient is medically stable to be discharged home.      Follow with Dr. Lynnette Caffey for further recommendation for the right      meniscal tear and for the sciatica evaluation.  2. Anemia.  Colonoscopy will be arranged at [facility]      to be done as an outpatient.  Hemoglobin remained stable after      transfusion.  3. Hypertension/diabetes mellitus.  As per his primary care.   VITAL SIGNS ON THE DATE OF DISCHARGE:  Temperature 98.7, pulse rate 77,  respiratory rate 20, blood pressure 135/86, saturating 100%.   CBG was 120.  White blood cells 6.7, hemoglobin 10.3, hematocrit 30.8,  platelets 250.   We felt that the patient is medically stable to be discharged home.      Hind I Laurie Panda, MD  Electronically Signed  HIE/MEDQ  D:  09/07/2006  T:  09/07/2006  Job:  [id number]   cc:   Barbette Merino, M.D.

## 2010-07-08 NOTE — Discharge Summary (Signed)
[name], [name] NO.:  [id number]   MEDICAL RECORD NO.:  [id number]          PATIENT TYPE:  INP   LOCATION:  1427                         FACILITY:  [facility]   PHYSICIAN:  Rexene Alberts, M.D.    DATE OF BIRTH:  04-24-1950   DATE OF ADMISSION:  04/05/2007  DATE OF DISCHARGE:                               DISCHARGE SUMMARY   INTERIM DISCHARGE SUMMARY   DISCHARGE DIAGNOSES:  1. Hypotension and tachycardia, thought to be secondary to a      combination of early sepsis and hypovolemia with concomitant      antihypertensive medication.  2. Coagulase-negative staphylococcus urinary tract infection.  3. Probable left lower lobe pneumonia.  4. Diarrhea, rule out Clostridium difficile.  5. Febrile illness, possibly secondary to a combination of urinary      tract infection, diarrhea, and pneumonia.  6. Acute renal failure secondary to bladder outlet obstruction and      hypovolemia in the setting of ACE inhibitor therapy.  7. Bladder outlet obstruction.  Resolved.  Status post removal of the      Foley catheter April 08, 2007.  8. Hypertension.  9. Type 2 diabetes mellitus.  10.Acute-on-chronic anemia, status post 1 unit of packed red blood      cell transfusion.  11.Recently treated herpes zoster (right flank area).   DISCHARGE MEDICATIONS:  To be dictated at the time of hospital  discharge.   CONSULTATIONS:  1. Dr. Burney Gauze.  2. Dr. Franchot Gallo.   PROCEDURES PERFORMED:  1. Chest x-ray on April 06, 2007.  The results revealed borderline      cardiomegaly.  No active lung disease.  2. CT scan of the abdomen and pelvis on April 06, 2007.  The      results revealed no acute abdominal abnormalities.  Distended      gallbladder, likely secondary to fasting state.  Mild thickening of      the ascending colon is likely due to underdistention rather than      mass.  No colonic inflammatory changes.  3. V/Q scan on April 06, 2007.  The  results revealed low likelihood      ratio for pulmonary embolism.  4. Chest x-ray on April 05, 2007.  The results revealed no acute      cardiopulmonary disease.  Minimal left base opacity felt to      represent scar or atelectasis.  Possibly left lower lobe pneumonia.   HISTORY OF PRESENT ILLNESS:  The patient is a 60 year old man with a  past medical history significant for multiple myeloma, hypertension,  type 2 diabetes mellitus, and anemia of chronic disease.  He was  recently diagnosed with a bladder outlet obstruction in the emergency  department on April 03, 2007.  A Foley catheter was inserted and the  patient was advised to follow up with urologist, Dr. Diona Fanti.  The  patient did follow up with Dr. Diona Fanti on April 05, 2007.  Per Dr.  Alan Ripper evaluation, the patient needed to be transferred to the  emergency department as he appeared very ill, short of breath, and  hypotensive.  When the patient arrived to the emergency department, he  was hypotensive with a blood pressure of 82/58.  He was tachycardic with  a heart rate of 122.  His venous glucose was low at 53.  His BUN was 43  and his creatinine was 2.69.  His chest x-ray revealed minimal left base  opacity, scar versus atelectasis versus pneumonia.  His urinalysis  revealed moderate leukocyte esterase and 3-6 wbc's.  The patient was  therefore admitted for further evaluation and management.   For additional details, please see the dictated history and physical.   HOSPITAL COURSE:  1. HYPOTENSION THOUGHT TO BE SECONDARY TO HYPOVOLEMIA, SEPSIS, AND      ANTIHYPERTENSIVE MEDICATION THERAPY.  At the time of the initial      hospital assessment, the patient was afebrile.  As indicated above,      his systolic blood pressure was 82.  Blood cultures were ordered      and the patient was started on empiric antibiotic treatment with      Zosyn.  Volume repletion was started with normal saline.  During      the  first 12 hours of the hospitalization, the patient received at      least 2 L of normal saline.  Following volume repletion, the      patient's blood pressure improved quickly and has remained above      123XX123 systolically.  Initially, lisinopril, hydrochlorothiazide, and      Norvasc were withheld.  An urine culture was ordered in addition to      the blood cultures.  The blood cultures have remained negative so      far.  However, the urine culture grew out 10,000 colonies of      staphylococcus (coagulase negative species).  Vancomycin was      recently started. For further evaluation, a V/Q scan was ordered to      rule out PE as the patient certainly has risk factors for pulmonary      embolism.  The results of the V/Q scan were consistent with a low      likelihood of pulmonary embolism.  The patient's blood pressure is      much better.  In fact, he has become hypertensive.  For this      reason, his antihypertensive medications have been restarted with      the exception of hydrochlorothiazide.  2. PERSISTENT FEBRILE ILLNESS/DIARRHEA/COAGULASE-NEGATIVE      STAPHYLOCOCCUS URINARY TRACT INFECTION.  As indicated above, the      patient was started on Zosyn empirically.  The blood cultures have      been negative so far.  The urine culture grew out 10,000 colonies      of coagulase-negative staphylococcus.  The patient's chest x-ray      was consistent with left lower lobe atelectasis versus pneumonia.      The followup chest x-ray on April 06, 2007, revealed no active      lung disease.  The patient continued spiking fevers through the      Zosyn after 4 days of therapy.  Therefore, the Zosyn was      discontinued and the patient was started on vancomycin for the      urinary tract infection.  The patient started developing diarrhea 2      days ago.  The diarrhea was felt to be secondary to Reglan and      MiraLax.  Both of  these medications were discontinued.  Following      the  discontinuation of these medications the patient continued to      have runny stools.  Therefore, the patient was started empirically      on Flagyl.  He is currently on hospital day #2 of Flagyl and      vancomycin.  As of today, he has been afebrile 24 hours.  Stools      have been sent for testing of C. difficile and the results are      currently pending.  Of note, a CT scan of the abdomen and pelvis      was ordered and it revealed mild thickening of the ascending colon      and a mildly distended gallbladder, likely secondary to the fasting      state according to the radiologist's interpretation.  The patient      has been mildly to moderately tender over the right side of his      abdomen.  However, it was initially felt to be secondary to the      herpes zoster.  In light of the CT scan findings, the patient may      have an underlying colitis, possibly secondary to C. difficile.  As      indicated previously, stool specimens have been sent for      evaluation.  3. ACUTE RENAL FAILURE AND BLADDER OUTLET OBSTRUCTION.  As indicated      above, the patient had an indwelling Foley catheter placed on      April 03, 2007, in the emergency department.  He followed up with      urologist, Dr. Diona Fanti, on April 05, 2007.  Dr. Diona Fanti      evaluated the patient during the hospitalization and recommended a      voiding trial once he was medically stable.  The voiding trial      began on April 08, 2007.  The patient passed with flying colors.      He is now urinating spontaneously with no complaints of urinary      hesitancy or dysuria.  He is being treated with Flomax 0.4 mg      daily.  At the time of the initial hospital assessment, the      patient's BUN was 43 and his creatinine was 2.69.  In comparison,      in January 2009 his BUN was 5 and his creatinine was 0.87.  The      acute renal failure was thought to be secondary to a combination of      bladder outlet  obstruction, prerenal azotemia, and hypotension in      the setting of ACE inhibitor therapy.  Initially, the      hydrochlorothiazide/lisinopril was discontinued.  As indicated      above, the patient was started on volume repletion.  Over the      course of the hospitalization, his renal function improved      progressively.  As of today, his BUN is 5 and his creatinine is      0.98.  4. HYPERTENSION.  As indicated above, the patient was initially      hypotensive on admission.  However, over the past several days the      patient has become progressively hypertensive.  Therefore, the      lisinopril and Norvasc were restarted.  The hydrochlorothiazide      remains on hold.  For additional blood pressure control, Toprol-XL      was added 2 days ago.  His blood pressures are much improved.  5. TYPE 2 DIABETES MELLITUS WITH HYPOGLYCEMIA INITIALLY.  As indicated      previously, the patient's venous glucose was 53 on admission.  He      was given an ampule of dextrose in the emergency department.  He      was monitored closely.  As the patient improved clinically, so did      his capillary blood glucose.  In fact, over the past few days his      capillary blood glucose has been ranging between 150 and 250.      Glipizide has been restarted with parameters.  Lantus was started      initially.  However, because of an additional episode of      asymptomatic hypoglycemia, the Lantus was discontinued.  The      patient continues on sliding scale NovoLog and glipizide.  6. ACUTE-ON-CHRONIC ANEMIA.  The patient's hemoglobin fell to a nadir      of 8.5.  The patient complained of generalized weakness.      Therefore, the patient was transfused 1 unit of packed red blood      cells.  Following the transfusion, the patient's hemoglobin      improved to 10.1.  The patient has a history of anemia of chronic      disease from multiple myeloma.  7. RECENTLY TREATED HERPES ZOSTER.  The patient was  started on      treatment with acyclovir by his primary care physician, Dr. Jonelle Sidle,      approximately 1 week prior to his presentation to the emergency      department on February 10.  Upon examination, the herpetic lesions      were healing and more or less scabbed over.  Therefore, acyclovir      was not continued.  Upon followup evaluation by Dr. [name], he      added Famvir once daily.  The patient is being treated as well with      Neurontin for herpetic pain.  8. MULTIPLE MYELOMA.  The patient continues to be followed by Dr.      [name].  He recently completed a session of chemotherapy prior to      hospital admission.   DISCHARGE DISPOSITION:  The patient is currently stable although he is  not quite ready for hospital discharge yet.  He has been afebrile for 24  hours.  Antibiotic therapy will continue with vancomycin and  antiparasitic therapy will continue with Flagyl.  Stool studies are  currently pending.  When the patient has improved more medically, he  will be discharged.      Rexene Alberts, M.D.  Electronically Signed     DF/MEDQ  D:  04/10/2007  T:  04/10/2007  Job:  [id number]   cc:   Barbette Merino, M.D.   Rudell Cobb. [name], M.D.  Fax: Zena. Dahlstedt, M.D.  Fax: [phone]

## 2010-07-08 NOTE — H&P (Signed)
NAMEASCENSION, HAKOLA NO.:  [id number]   MEDICAL RECORD NO.:  [id number]          PATIENT TYPE:  INP   LOCATION:  1304                         FACILITY:  [facility]   PHYSICIAN:  Virgie Dad. Magrinat, M.D.DATE OF BIRTH:  [birth year]   DATE OF ADMISSION:  12/19/2006  DATE OF DISCHARGE:                              HISTORY & PHYSICAL   HISTORY OF PRESENT ILLNESS:  [name] is a 60 year old [place]  man with a diagnosis of IgG kappa multiple myeloma made by bone marrow  biopsy in September 8 showing 22% plasma cells.  He does not have a  significant total IgG or serum light chain level and he had a negative  bone survey.  He was started on Velcade and Zometa on December 17, 2006.  The patient tells me that for the next day after treatment he felt dizzy  and weak and developed a splitting headache.  This brought him to the  emergency room today where he was found to be febrile.  His headache was  evaluated with a CT of the head which was unremarkable.  He is being  admitted as a 23-hour observation for fluids, fever workup and  initiation of antibiotics.   PAST MEDICAL HISTORY:  1. Hypertension.  2. Diabetes.  3. Anemia.  4. Chronic bone pain.  5. History of drug abuse.   MEDICATIONS:  1. Norvasc.  2. Lisinopril.  3. Glucotrol.  4. Tramadol.  5. Gabapentin.  6. EMLA cream.   ALLERGIES:  NO KNOWN DRUG ALLERGIES.   REVIEW OF SYSTEMS:  This is dominated by his headache which he says is  very severe.  He denies photophobia, nausea, vomiting or visual changes.  He just has felt very weak for the last day and a half.  He had his  most recent bowel movement within 2 hours of admission.  He has had no  cough, phlegm production, pleurisy or shortness of breath.  No chest  pain or pressure.  No dysuria, hematuria, no bleeding and no rash.   PHYSICAL EXAMINATION:  GENERAL:  He is a middle-aged African-American  male.  VITAL SIGNS:  Temperature currently of 99.4,  pulse 96, respiratory rate  18, blood pressure 124/83 and a saturation of 100% on room air.  HEENT:  Sclerae are not icteric.  Oropharynx is clear.  I do not palpate  any cervical adenopathy.  LUNGS:  Auscultated anteriorly, showed no crackles or wheezes.  HEART:  Regular rate and rhythm.  ABDOMEN:  Benign.  No organomegaly.  MUSCULOSKELETAL:  No peripheral edema.  NEUROLOGIC:  Nonfocal.   LABORATORY DATA:  White cell count of 10.6, ANC 9.1, hemoglobin 9.2, MCV  77.6 and platelets 215,000.  CMET is remarkable only for a glucose of  109.  His albumin is 3.6, total protein 7.3.  The rest of the  electrolytes are normal.  Normal liver function tests.  Normal  creatinine of 1.06.  Urinalysis was negative.   IMPRESSION AND PLAN:  A 60 year old [place] man with a history of  multiple myeloma, early stage, status post one dose of Velcade and  Zometa given  48 hours prior to admission, now with some dizziness,  headache and fever.   There is no evidence of intracranial involvement or significant sinus  problems by head CT, and there are no bony lesions in the calvarium.  The plan is to hydrate the patient, cover him with antibiotics while  cultures of urine and blood are pending.  We will continue his home  medications and monitor his glucose while in the hospital.  He is being  admitted as a 23-hour observation, as it is my feeling he may be able to  be discharged tomorrow, but of course that will depend on how he does  overnight.      Virgie Dad. Magrinat, M.D.  Electronically Signed     GCM/MEDQ  D:  12/19/2006  T:  12/20/2006  Job:  [id number]   cc:   Rudell Cobb. [name], M.D.  Fax: [facility]

## 2010-07-08 NOTE — Assessment & Plan Note (Signed)
[facility]   NAME:  ARBAZ, RUMORE              ACCOUNT NO.:  [id number]   MEDICAL RECORD NO.:  [id number]      DATE OF BIRTH:  18-Mar-1950   PHYSICIAN:  Epifania Gore. Nils Pyle, M.D. VISIT DATE:  07/07/2007                                   OFFICE VISIT   SUBJECTIVE:  Mr. Trottier is a 60 year old man who we are following for  bilateral Wagner grade 1 heel ulcers.  In the interim, we have treated  him with offloading healing sandals.  He continues to employ routine  diabetic foot care, washing his feet with antiseptic/liquid Dial soap  and wearing a thick cotton sock with a offloading heel healing sandal.  There has been no interim fever, pain, or injury.   OBJECTIVE:  Blood pressure is 132/74, respirations 16, pulse rate 89,  temperature is 98.8, capillary blood glucose is 152 mg percent.  He is  accompanied by his sister.  Inspection of the feet shows that there are  readily palpable dorsalis pedis pulses.  The subdermal hemorrhages  remain intact with overlying cornified epithelium.  On the left heel,  this area appears to be near mummified with no drainage and no  tenderness.  The right area shows some receding of the subdermal  ecchymosis.   IMPRESSION:  Clinically improved ulcerations secondary to pressure.   PLAN:  We will continue routine diabetic foot care with the offloading  healing sandal.  We will reevaluate the patient in 1 month.  We have  cautioned the patient that sometimes these areas of demarcation may  breakdown and develop ulcerations and drainage.  If this occurs, he is  to call the clinic for an interim evaluation.  We have given the patient  and his sister an opportunity to ask questions, they seem to understand  and indicate that they will be compliant.      Harold A. Nils Pyle, M.D.  Electronically Signed     HAN/MEDQ  D:  07/07/2007  T:  07/08/2007  Job:  [id number]

## 2010-07-08 NOTE — H&P (Signed)
NAMECHAUNCY, [name] NO.:  [id number]   MEDICAL RECORD NO.:  [id number]          PATIENT TYPE:  INP   LOCATION:  0113                         FACILITY:  [facility]   PHYSICIAN:  Rexene Alberts, M.D.    DATE OF BIRTH:  1950-06-28   DATE OF ADMISSION:  04/05/2007  DATE OF DISCHARGE:                              HISTORY & PHYSICAL   PRIMARY CARE PHYSICIAN:  Barbette Merino, M.D.   PRIMARY ONCOLOGIST:  Rudell Cobb. [name], M.D.   PRIMARY UROLOGIST:  Lillette Boxer. Dahlstedt, M.D.   CHIEF COMPLAINT:  Generalized weakness, shortness of breath, nausea, and  shakiness.   HISTORY OF PRESENT ILLNESS:  The patient is a 60 year old man with a  past medical history significant for multiple myeloma, type 2 diabetes  mellitus, chronic pain, and hypertension. He presents to the emergency  department with a chief complaint of generalized weakness, nausea,  shortness of breath, and shakiness. The patient was recently diagnosed  with urinary retention. He was seen in the emergency department on  April 03, 2007 for a chief complaint of not being able to urinate for  3-4 days. A Foley catheter was placed and the patient followed up with  Dr. Diona Fanti today in his office. When the patient was evaluated by Dr.  Diona Fanti, he was found to be hypotensive. The patient was therefore  sent to the emergency department for further evaluation. The patient  states that over the past 2-3 days, he has felt generally weak and short  of breath. He has had some subjective fever and chills and night sweats.  He has had mild pleuritic chest pain on the left. He continues to have  chronic pain in his joints. His shortness of breath generally comes with  activity and subsides with rest. He denies any focal weakness,  difficulty speaking, difficulty swallowing, or changes in his vision. He  says that he feels weak all over. He has also had some intermittent  right greater than left lower extremity swelling. He  was recently  diagnosed with shingles; he was started on treatment with acyclovir  which he says that he had taken as directed. He has also not been  checking his capillary blood sugar on a consistent basis and therefore  he does not recall having any recent low blood sugars.   During the evaluation in the emergency department, the patient is noted  to be hypotensive with a blood pressure initially of 82/58. He is  currently afebrile. He is tachycardic with a heart rate of 122. His lab  data are significant for a normal white blood cell count of 9.2, a  venous glucose of 53, BUN of 43, and a creatinine of 2.69. His  urinalysis reveals moderate leukocyte esterase and 3-6 WBCs. His chest x-  ray reveals  minimal left base opacity felt to represent scar of  atelectasis; consideration for left lower lobe pneumonia. The patient  will therefore be admitted for further evaluation and management.   PAST MEDICAL HISTORY:  1. Multiple myeloma, undergoing chemotherapy. The patient's primary      oncologist is Dr. Burney Gauze.  2. Urinary retention, status post Foley catheter insertion several      days ago. The patient followed up with urologist, Dr. Diona Fanti,      today.  3. Hypertension.  4. Type 2 diabetes mellitus.  5. Anemia of chronic disease.  6. Diabetic neuropathy.  7. Constipation.  8. Chronic pain.  9. Acute gastroenteritis in January 2009.  10.Possibly esophageal stricture versus gastroparesis.  11.Herpes zoster diagnosed 1 week ago.   MEDICATIONS:  1. Reglan 10 mg q.i.d.  2. Protonix 40 mg daily.  3. Methocarbamol question dose every 6 hours as needed.  4. Meloxicam 15 mg daily.  5. Glipizide 5 mg daily.  6. Lisinopril/hydrochlorothiazide 20/25 mg daily.  7. Flomax 0.4 mg daily.  8. Roxicodone 15 mg every 6 h as needed.  9. OxyContin 20 mg q.12 h.  10.Acyclovir question dose, 400 mg t.i.d., started 1 week ago.  11.Ibuprofen 800 mg t.i.d.  12.Norvasc 5 mg daily.    ALLERGIES:  The patient has an allergy to IODINE.   SOCIAL HISTORY:  The patient is divorced. He has 3 children. He lives in  [place], [place]. He is disabled. He denies tobacco, alcohol  and illicit drug use.   FAMILY HISTORY:  His mother died of breast cancer in her 67s. His father  died of bone cancer in his 71s.   REVIEW OF SYSTEMS:  No nausea, vomiting, diarrhea, black tarry stools or  bright red blood per rectum.   PHYSICAL EXAMINATION:  Temperature 98.3, blood pressure 93/58 (initially  82/58), heart rate 116, respiratory rate 16, oxygen saturation 98% on 2  liters of oxygen.  GENERAL:  The patient is a 60 year old African-American man who is  currently lying in bed in no acute distress at this time although he  does appear ill.  HEENT:  Head is normocephalic, atraumatic. Pupils are equal, round and  reactive to light. Extraocular movements intact. Conjunctiva are clear.  Sclera white. Nasal mucosa is dry, no sinus tenderness. Oropharynx  reveals mildly dry mucous membranes. No posterior exudates or erythema.  NECK:  Supple, no adenopathy, no thyromegaly, no bruit, no JVD.  LUNGS:  Decreased breath sounds in the bases otherwise clear.  HEART:  S1, S2 with tachycardia.  CHEST WALL:  There is a right chest wall Port-A-Cath covered with a 4 x  4 gauze, clean and dry.  ABDOMEN:  Mildly obese, hypoactive bowel sounds, soft, mildly tender in  the hypogastrium and right flank over the drying herpetic lesions; no  distention, guarding or hepatosplenomegaly.  GU:  Deferred although the patient does have an indwelling Foley  catheter draining dark yellow urine.  RECTAL:  Deferred.  EXTREMITIES:  Pedal pulses palpable bilaterally. No pretibial edema and  no pedal edema.  MUSCULOSKELETAL:  The patient has mild diffuse tenderness over his legs  and his arms proximally without any acute joint findings or erythema.  NEUROLOGIC:  The patient is alert and oriented x3, cranial  nerves II-XII  are intact. Strength is 5/5 in the supine position. Sensation is grossly  intact.   ADMISSION LABORATORIES:  EKG reveals sinus tachycardia with a heart rate  of 123 beats per minute. Chest x-ray results are above. WBC 9.2,  hemoglobin 10.3, platelets 152, sodium 139, potassium 4.7. CO2 26,  chloride 105, glucose 53, BUN 43, creatinine 2.69, calcium 9.4.  Urinalysis, urine hemoglobin large, urine protein 30, urine leukocyte  esterase moderate, urine nitrite negative, urine WBC 3-6, urine RBC 0-2.   ASSESSMENT:  1. Hypotension  with tachycardia. The patient's systolic blood pressure      was 82 on arrival to the emergency room and his heart rate was in      the 120s. The patient has a combination of possible early sepsis      and hypovolemia. His blood pressure is coming up nicely with IV      fluids. The patient's white blood cell count is within normal      limits and he is afebrile, which may argue against an overwhelming      infection.  2. Probable urinary tract infection.  3. Probable left lower lobe pneumonia.  4. Shortness of breath. The shortness of breath appears to be out of      proportion to the chest x-ray findings. Given his history of      multiple myeloma, PE is a concern.  5. Acute renal failure. More than likely, the acute renal failure is      secondary to obstructive uropathy. The patient's BUN is 45 and his      creatinine is 2.69 today. In comparison, his BUN was 5 and his      creatinine was 0.87 March 17, 2007.  6. Hypoglycemia. The patient's venous glucose is 53. He has chronic      type 2 diabetes mellitus and is treated with glipizide.  7. Urinary retention. Management per Dr. Diona Fanti. The patient came      to the emergency department with an indwelling Foley catheter that      was placed several days ago in the emergency department.  8. Multiple myeloma. The patient is followed by oncologist, Dr.      [name]. He has undergone  chemotherapy.   PLAN:  1. The patient will be admitted for further evaluation and management.  2. He received a bolus of 1 liter of IV fluids in the emergency      department. Will continue IV fluid volume repletion with not only      D5 normal saline in one IV line but another IV line which will have      normal saline only.  3. Will start zosyn empirically.  4. Will hold lisinopril and Glipizide.  5. Will check blood cultures and an urine culture.  6. Will check a VQ scan to rule out PE.  7. Will not put a limit on his diet for now.  8. Consider ultrasound of the kidneys if not already done previously.      Rexene Alberts, M.D.  Electronically Signed     DF/MEDQ  D:  04/05/2007  T:  04/05/2007  Job:  [id number]   cc:   Barbette Merino, M.D.   Rudell Cobb. [name], M.D.  Fax: [place]. Dahlstedt, M.D.  Fax: [phone]

## 2010-07-08 NOTE — Assessment & Plan Note (Signed)
[facility]   NAME:  [name], [name] NO.:  [id number]   MEDICAL RECORD NO.:  [id number]      DATE OF BIRTH:  09-Aug-1950   PHYSICIAN:  Ricard Dillon, M.D. VISIT DATE:  08/18/2007                                   OFFICE VISIT   [name] returns today for followup of his Wegener grade 1 diabetic  foot ulcer of both heels.  He has been using successful offloading  sandals.  He has been using Iodosorb to the left heel.  He has had no  pain, no drainage, and no fever.  He continues to be ambulatory.   On examination, temperature 98.2, pulse 77, blood pressure is 149/84.  Inspection of the right heel shows an adherent callus that had its upper  margins separating.  I pared the callus completely today using a #10  blade.  There was no need for anesthesia or hemostasis.  Underneath,  there was nothing but healing skin.  I am therefore declaring this wound  resolved.  On the left heel is a wound measuring 2 x 1.7 x 0.25.  This  is healthy-looking granulation tissue with advancing epithelialization.  There is some callus around this wound.  However, I elected not to  debride this for fear of disrupting the advancing epithelialization.  There was no drainage.  No odor and no evidence of infection.   IMPRESSION:  1. Bilateral diabetic foot ulcers, Wegener grade 1.  I am declaring.  2. Resolved at this point, and we will continue with antiseptic soap      washes and Iodosorb and the offloading healing sandal to the left      heel.  We will review him in 2 weeks.  At that point in time, we      may change the Iodosorb as the wound did not look as though it was      draining.  However for now, I have continued this.  The right heel,      I have elected to continue the offloading healing sandal to allow      toughening of the gentle area where the ulcer was.  At that point      in 2 weeks, I think he can transition back to normal foot wear on  the right.           ______________________________  Ricard Dillon, M.D.     MGR/MEDQ  D:  08/18/2007  T:  08/18/2007  Job:  [id number]

## 2010-07-08 NOTE — Assessment & Plan Note (Signed)
[facility]   NAME:  [name], [name] NO.:  [id number]   MEDICAL RECORD NO.:  [id number]      DATE OF BIRTH:  [birth year]   PHYSICIAN:  Ricard Dillon, M.D.      VISIT DATE:                                   OFFICE VISIT   [name] returns today for followup of his [name] grade 1 diabetic  foot ulcer of both heels.  I declared the one on the right heel resolved  last time.  He has continued to do antiseptic soap washes, Iodosorb to  the left heel.  He reports some pain, but no drainage and no fever.  He  continues to be very ambulatory.   PHYSICAL EXAMINATION:  VITAL SIGNS:  Temperature is 98.1, pulse 78,  respiration 16, blood pressure 149/90.  EXTREMITIES:  In the area of the original wound on the left heel it has  contracted nicely with advancing epithelialization.  In fact, there is  only a small area measuring 0.5 x 0.4 x 0.1 that is not epithelialized.  However, he has developed probably pressure-related callus formation of  the skin on the superior aspect of the wound, which was previously  normal.  I suspect ongoing pressure on this area, this  skin may need to  be debrided next time.   IMPRESSION:  Wagner grade 1 diabetic foot wound.  I have discontinued  the Iodosorb, and I have asked him to use antiseptic soap washes and  Polysporin.  I wonder whether the Iodosorb is irritating some of his  surrounding skin.  I have given him pressure relief and counseling  including when he is up and when he is in bed.  We will see him again in  2 weeks.           ______________________________  Ricard Dillon, M.D.     MGR/MEDQ  D:  09/05/2007  T:  09/05/2007  Job:  [id number]

## 2010-07-09 LAB — HEMOGLOBINOPATHY EVALUATION
Hemoglobin Other: 0 % (ref 0.0–0.0)
Hgb A: 97.6 % (ref 96.8–97.8)
Hgb F Quant: 0 % (ref 0.0–2.0)
Hgb S Quant: 0 % (ref 0.0–0.0)

## 2010-07-09 LAB — SPEP & IFE WITH QIG
Albumin ELP: 50.7 % — ABNORMAL LOW (ref 55.8–66.1)
Alpha-1-Globulin: 3.8 % (ref 2.9–4.9)
Beta 2: 5.9 % (ref 3.2–6.5)
Beta Globulin: 5.4 % (ref 4.7–7.2)
Gamma Globulin: 20.6 % — ABNORMAL HIGH (ref 11.1–18.8)
IgA: 341 mg/dL (ref 68–378)
IgM, Serum: 66 mg/dL (ref 60–263)

## 2010-07-09 LAB — RETICULOCYTES (CHCC)
ABS Retic: 35.4 10*3/uL (ref 19.0–186.0)
RBC.: 3.54 MIL/uL — ABNORMAL LOW (ref 4.22–5.81)

## 2010-07-09 LAB — FERRITIN: Ferritin: 518 ng/mL — ABNORMAL HIGH (ref 22–322)

## 2010-07-09 LAB — IRON AND TIBC
%SAT: 25 % (ref 20–55)
Iron: 64 ug/dL (ref 42–165)
UIBC: 194 ug/dL

## 2010-07-09 LAB — KAPPA/LAMBDA LIGHT CHAINS: Kappa:Lambda Ratio: 1.26 (ref 0.26–1.65)

## 2010-09-01 ENCOUNTER — Encounter (HOSPITAL_BASED_OUTPATIENT_CLINIC_OR_DEPARTMENT_OTHER): Payer: Medicaid Other | Admitting: Hematology & Oncology

## 2010-09-01 ENCOUNTER — Other Ambulatory Visit: Payer: Self-pay | Admitting: Hematology & Oncology

## 2010-09-01 DIAGNOSIS — Z5111 Encounter for antineoplastic chemotherapy: Secondary | ICD-10-CM

## 2010-09-01 DIAGNOSIS — C9000 Multiple myeloma not having achieved remission: Secondary | ICD-10-CM

## 2010-09-01 DIAGNOSIS — D509 Iron deficiency anemia, unspecified: Secondary | ICD-10-CM

## 2010-09-01 LAB — CBC WITH DIFFERENTIAL (CANCER CENTER ONLY)
BASO#: 0 10*3/uL (ref 0.0–0.2)
BASO%: 0.4 % (ref 0.0–2.0)
EOS%: 2.2 % (ref 0.0–7.0)
HCT: 31.7 % — ABNORMAL LOW (ref 38.7–49.9)
HGB: 10.8 g/dL — ABNORMAL LOW (ref 13.0–17.1)
LYMPH%: 29.6 % (ref 14.0–48.0)
MCH: 26.4 pg — ABNORMAL LOW (ref 28.0–33.4)
MCHC: 34.1 g/dL (ref 32.0–35.9)
MONO%: 10.1 % (ref 0.0–13.0)
NEUT%: 57.7 % (ref 40.0–80.0)
RDW: 13.7 % (ref 11.1–15.7)

## 2010-09-03 LAB — COMPREHENSIVE METABOLIC PANEL
AST: 20 U/L (ref 0–37)
Alkaline Phosphatase: 55 U/L (ref 39–117)
BUN: 27 mg/dL — ABNORMAL HIGH (ref 6–23)
Glucose, Bld: 147 mg/dL — ABNORMAL HIGH (ref 70–99)
Sodium: 136 mEq/L (ref 135–145)
Total Bilirubin: 0.3 mg/dL (ref 0.3–1.2)

## 2010-09-03 LAB — IRON AND TIBC
%SAT: 39 % (ref 20–55)
Iron: 96 ug/dL (ref 42–165)
TIBC: 249 ug/dL (ref 215–435)

## 2010-09-03 LAB — FERRITIN: Ferritin: 785 ng/mL — ABNORMAL HIGH (ref 22–322)

## 2010-09-03 LAB — SPEP & IFE WITH QIG
Alpha-1-Globulin: 3.4 % (ref 2.9–4.9)
Alpha-2-Globulin: 13.7 % — ABNORMAL HIGH (ref 7.1–11.8)
Gamma Globulin: 20.9 % — ABNORMAL HIGH (ref 11.1–18.8)

## 2010-09-03 LAB — VITAMIN D 25 HYDROXY (VIT D DEFICIENCY, FRACTURES): Vit D, 25-Hydroxy: 36 ng/mL (ref 30–89)

## 2010-09-16 ENCOUNTER — Other Ambulatory Visit: Payer: Self-pay | Admitting: Internal Medicine

## 2010-09-18 ENCOUNTER — Ambulatory Visit
Admission: RE | Admit: 2010-09-18 | Discharge: 2010-09-18 | Disposition: A | Discharge status: Home / Self Care | Payer: Medicaid Other | Source: Ambulatory Visit | Attending: Internal Medicine | Admitting: Internal Medicine

## 2010-09-18 ENCOUNTER — Other Ambulatory Visit: Payer: Self-pay | Admitting: Internal Medicine

## 2010-10-10 ENCOUNTER — Telehealth: Payer: Self-pay | Admitting: Gastroenterology

## 2010-10-10 NOTE — Telephone Encounter (Signed)
Patient c/o severe dysphagia.  He reports his food is frequently getting stuck in his throat.  He reports SOB.  I have given him an appt with Dr Fuller Plan for Monday for his dysphagia and asked him to schedule an appt with his primary care also for his SOB.

## 2010-10-13 ENCOUNTER — Ambulatory Visit (INDEPENDENT_AMBULATORY_CARE_PROVIDER_SITE_OTHER): Payer: Medicaid Other | Admitting: Gastroenterology

## 2010-10-13 ENCOUNTER — Encounter: Payer: Self-pay | Admitting: Gastroenterology

## 2010-10-13 VITALS — BP 104/80 | HR 88 | Ht 71.0 in | Wt 195.0 lb

## 2010-10-13 DIAGNOSIS — K224 Dyskinesia of esophagus: Secondary | ICD-10-CM

## 2010-10-13 DIAGNOSIS — R1319 Other dysphagia: Secondary | ICD-10-CM

## 2010-10-13 MED ORDER — OMEPRAZOLE 40 MG PO CPDR: 40.0000 mg | DELAYED_RELEASE_CAPSULE | Freq: Every day | ORAL | Status: DC

## 2010-10-13 NOTE — Patient Instructions (Signed)
A prescription for omeprazole has been sent to your pharmacy. Follow up as needed.  cc: Barbette Merino, MD

## 2010-10-13 NOTE — Progress Notes (Signed)
History of Present Illness: This is a 60 year old male with a history of a hypertensive lower esophageal sphincter noted on esophageal manometry study in 2011. Barium esophagram with tablet showed transient holdup of a pill at the esophagogastric junction. His symptoms have not responded to esophageal dilation. He has had persistent problems with solid food dysphagia which has slightly worsened. He tolerates soft foods reasonably well. No heartburn. He notes about a 10 pound weight loss over 6-12 months.  Current Medications, Allergies, Past Medical History, Past Surgical History, Family History and Social History were reviewed in [employer] record.  Physical Exam: General: Well developed , well nourished, no acute distress Head: Normocephalic and atraumatic Eyes:  sclerae anicteric, EOMI Ears: Normal auditory acuity Mouth: No deformity or lesions Lungs: Clear throughout to auscultation Heart: Regular rate and rhythm; no murmurs, rubs or bruits Abdomen: Soft, non tender and non distended. No masses, hepatosplenomegaly or hernias noted. Normal Bowel sounds Musculoskeletal: Symmetrical with no gross deformities  Pulses:  Normal pulses noted Extremities: No clubbing, cyanosis, edema or deformities noted Neurological: Alert oriented x 4, grossly nonfocal Psychological:  Alert and cooperative. Normal mood and affect  Assessment and Recommendations:  1. Hypertensive lower esophageal sphincter. Persistent solid food dysphagia. He has had an extensive evaluation to date. Trial of daily PPI. Continue soft food diet with significant liquids at all meals. We will consider referral to a tertiary center for management of pain and if his symptoms do not improve.  2. Colorectal cancer screening. Colonoscopy due September 2018.

## 2010-11-06 ENCOUNTER — Encounter (HOSPITAL_BASED_OUTPATIENT_CLINIC_OR_DEPARTMENT_OTHER): Payer: Medicaid Other | Admitting: Hematology & Oncology

## 2010-11-06 ENCOUNTER — Other Ambulatory Visit: Payer: Self-pay | Admitting: Hematology & Oncology

## 2010-11-06 DIAGNOSIS — D649 Anemia, unspecified: Secondary | ICD-10-CM

## 2010-11-06 DIAGNOSIS — N289 Disorder of kidney and ureter, unspecified: Secondary | ICD-10-CM

## 2010-11-06 DIAGNOSIS — IMO0001 Reserved for inherently not codable concepts without codable children: Secondary | ICD-10-CM

## 2010-11-06 DIAGNOSIS — C9001 Multiple myeloma in remission: Secondary | ICD-10-CM

## 2010-11-06 LAB — CBC WITH DIFFERENTIAL (CANCER CENTER ONLY)
BASO#: 0.1 10*3/uL (ref 0.0–0.2)
Eosinophils Absolute: 0.2 10*3/uL (ref 0.0–0.5)
HCT: 29.7 % — ABNORMAL LOW (ref 38.7–49.9)
LYMPH%: 26.6 % (ref 14.0–48.0)
MCH: 26.3 pg — ABNORMAL LOW (ref 28.0–33.4)
MCV: 77 fL — ABNORMAL LOW (ref 82–98)
MONO#: 0.7 10*3/uL (ref 0.1–0.9)
NEUT%: 60.3 % (ref 40.0–80.0)
RBC: 3.84 10*6/uL — ABNORMAL LOW (ref 4.20–5.70)
WBC: 6.9 10*3/uL (ref 4.0–10.0)

## 2010-11-06 LAB — CMP (CANCER CENTER ONLY)
BUN, Bld: 23 mg/dL — ABNORMAL HIGH (ref 7–22)
CO2: 28 mEq/L (ref 18–33)
Creat: 2.6 mg/dl — ABNORMAL HIGH (ref 0.6–1.2)
Glucose, Bld: 114 mg/dL (ref 73–118)
Total Bilirubin: 0.4 mg/dl (ref 0.20–1.60)
Total Protein: 8.6 g/dL — ABNORMAL HIGH (ref 6.4–8.1)

## 2010-11-10 LAB — PROTEIN ELECTROPHORESIS, SERUM
Gamma Globulin: 20.8 % — ABNORMAL HIGH (ref 11.1–18.8)
Total Protein, Serum Electrophoresis: 7.9 g/dL (ref 6.0–8.3)

## 2010-11-10 LAB — IRON AND TIBC
%SAT: 26 % (ref 20–55)
Iron: 68 ug/dL (ref 42–165)
TIBC: 262 ug/dL (ref 215–435)
UIBC: 194 ug/dL (ref 125–400)

## 2010-11-10 LAB — KAPPA/LAMBDA LIGHT CHAINS: Kappa free light chain: 2.82 mg/dL — ABNORMAL HIGH (ref 0.33–1.94)

## 2010-11-10 LAB — FERRITIN: Ferritin: 719 ng/mL — ABNORMAL HIGH (ref 22–322)

## 2010-11-13 LAB — CBC
HCT: 34.8 — ABNORMAL LOW
Hemoglobin: 10.2 — ABNORMAL LOW
Hemoglobin: 11.4 — ABNORMAL LOW
MCHC: 33.5
MCV: 76.3 — ABNORMAL LOW
Platelets: 48 — CL
Platelets: 93 — ABNORMAL LOW
RDW: 17.2 — ABNORMAL HIGH
RDW: 17.3 — ABNORMAL HIGH
WBC: 19.6 — ABNORMAL HIGH
WBC: 7.9

## 2010-11-13 LAB — DIFFERENTIAL
Basophils Absolute: 0
Basophils Absolute: 0.2 — ABNORMAL HIGH
Basophils Relative: 0
Eosinophils Absolute: 0.2
Eosinophils Relative: 2
Lymphocytes Relative: 20
Lymphs Abs: 2.4
Monocytes Absolute: 1.3 — ABNORMAL HIGH
Monocytes Absolute: 1.8 — ABNORMAL HIGH
Neutro Abs: 5.3
Neutrophils Relative %: 77

## 2010-11-13 LAB — BASIC METABOLIC PANEL
BUN: 15
BUN: 5 — ABNORMAL LOW
CO2: 24
Calcium: 8.3 — ABNORMAL LOW
Calcium: 8.4
GFR calc non Af Amer: >60
GFR calc non Af Amer: >60
Glucose, Bld: 117 — ABNORMAL HIGH
Glucose, Bld: 122 — ABNORMAL HIGH
Sodium: 137
Sodium: 137

## 2010-11-13 LAB — URINALYSIS, ROUTINE W REFLEX MICROSCOPIC
Glucose, UA: 250 — AB
Hgb urine dipstick: NEGATIVE
Ketones, ur: NEGATIVE
pH: 6

## 2010-11-13 LAB — COMPREHENSIVE METABOLIC PANEL
Albumin: 4
Alkaline Phosphatase: 60
BUN: 37 — ABNORMAL HIGH
Chloride: 101
Creatinine, Ser: 1.24
GFR calc non Af Amer: >60
Glucose, Bld: 190 — ABNORMAL HIGH
Potassium: 4.6
Total Bilirubin: 0.9

## 2010-11-13 LAB — HEMOGLOBIN A1C: Hgb A1c MFr Bld: 7.6 — ABNORMAL HIGH

## 2010-11-13 LAB — LIPASE, BLOOD: Lipase: 27

## 2010-11-14 LAB — COMPREHENSIVE METABOLIC PANEL
ALT: 34
ALT: 59 — ABNORMAL HIGH
AST: 25
AST: 31
Albumin: 3 — ABNORMAL LOW
Albumin: 3.1 — ABNORMAL LOW
Albumin: 3.1 — ABNORMAL LOW
Alkaline Phosphatase: 38 — ABNORMAL LOW
BUN: 30 — ABNORMAL HIGH
BUN: 7
CO2: 23
CO2: 25
Calcium: 8.3 — ABNORMAL LOW
Calcium: 8.5
Chloride: 107
Creatinine, Ser: 1.67 — ABNORMAL HIGH
Creatinine, Ser: 1.02
GFR calc Af Amer: >60
GFR calc Af Amer: >60
GFR calc Af Amer: >60
GFR calc non Af Amer: >60
Potassium: 3.7
Potassium: 4
Sodium: 136
Sodium: 138
Total Bilirubin: 0.5
Total Bilirubin: 0.7
Total Protein: 5.6 — ABNORMAL LOW
Total Protein: 6
Total Protein: 6.2

## 2010-11-14 LAB — CBC
HCT: 24.8 — ABNORMAL LOW
HCT: 27.4 — ABNORMAL LOW
HCT: 31.2 — ABNORMAL LOW
HCT: 28.3 — ABNORMAL LOW
HCT: 28.7 — ABNORMAL LOW
Hemoglobin: 10.3 — ABNORMAL LOW
Hemoglobin: 8.5 — ABNORMAL LOW
Hemoglobin: 9.3 — ABNORMAL LOW
Hemoglobin: 9.3 — ABNORMAL LOW
Hemoglobin: 9.6 — ABNORMAL LOW
Hemoglobin: 9.6 — ABNORMAL LOW
MCHC: 33
MCHC: 34
MCHC: 34.1
MCHC: 33.5
MCHC: 33.8
MCHC: 33.9
MCV: 74.3 — ABNORMAL LOW
MCV: 74.4 — ABNORMAL LOW
MCV: 75.1 — ABNORMAL LOW
MCV: 76.3 — ABNORMAL LOW
MCV: 76.4 — ABNORMAL LOW
Platelets: 137 — ABNORMAL LOW
Platelets: 152
Platelets: 152
Platelets: 212
Platelets: 259
Platelets: 309
RBC: 3.33 — ABNORMAL LOW
RBC: 3.69 — ABNORMAL LOW
RBC: 4.16 — ABNORMAL LOW
RBC: 3.58 — ABNORMAL LOW
RBC: 3.76 — ABNORMAL LOW
RDW: 18.1 — ABNORMAL HIGH
RDW: 18.3 — ABNORMAL HIGH
RDW: 18.6 — ABNORMAL HIGH
RDW: 18.8 — ABNORMAL HIGH
RDW: 19.3 — ABNORMAL HIGH
RDW: 19.5 — ABNORMAL HIGH
RDW: 19.7 — ABNORMAL HIGH
WBC: 6
WBC: 9
WBC: 9.2
WBC: 4.2

## 2010-11-14 LAB — BASIC METABOLIC PANEL
BUN: 11
BUN: 12
BUN: 13
CO2: 21
CO2: 23
CO2: 23
Calcium: 8.4
Calcium: 8.9
Chloride: 105
GFR calc Af Amer: >60
GFR calc non Af Amer: >60
GFR calc non Af Amer: 58 — ABNORMAL LOW
GFR calc non Af Amer: >60
GFR calc non Af Amer: >60
GFR calc non Af Amer: >60
Glucose, Bld: 215 — ABNORMAL HIGH
Glucose, Bld: 113 — ABNORMAL HIGH
Glucose, Bld: 116 — ABNORMAL HIGH
Glucose, Bld: 78
Glucose, Bld: 87
Potassium: 3.8
Potassium: 3.5
Potassium: 3.6
Potassium: 3.7
Sodium: 133 — ABNORMAL LOW
Sodium: 133 — ABNORMAL LOW
Sodium: 136
Sodium: 137
Sodium: 139

## 2010-11-14 LAB — CROSSMATCH
ABO/RH(D): O POS
Antibody Screen: NEGATIVE

## 2010-11-14 LAB — HLA-B27 ANTIGEN

## 2010-11-14 LAB — URINALYSIS, ROUTINE W REFLEX MICROSCOPIC
Bilirubin Urine: NEGATIVE
Glucose, UA: NEGATIVE
Glucose, UA: NEGATIVE
Hgb urine dipstick: NEGATIVE
Hgb urine dipstick: NEGATIVE
Ketones, ur: NEGATIVE
Nitrite: NEGATIVE
Nitrite: NEGATIVE
Protein, ur: 30 — AB
Protein, ur: NEGATIVE
Specific Gravity, Urine: 1.015
Specific Gravity, Urine: 1.015
Urobilinogen, UA: 0.2
Urobilinogen, UA: 0.2
Urobilinogen, UA: 0.2
pH: 6
pH: 6

## 2010-11-14 LAB — DIFFERENTIAL
Basophils Absolute: 0
Basophils Relative: 0
Eosinophils Absolute: 0.2
Eosinophils Relative: 2
Lymphocytes Relative: 9 — ABNORMAL LOW
Lymphs Abs: 0.8
Monocytes Absolute: 1.7 — ABNORMAL HIGH
Monocytes Relative: 19 — ABNORMAL HIGH
Neutro Abs: 6.5
Neutrophils Relative %: 71

## 2010-11-14 LAB — CLOSTRIDIUM DIFFICILE EIA
C difficile Toxins A+B, EIA: NEGATIVE
C difficile Toxins A+B, EIA: NEGATIVE

## 2010-11-14 LAB — CULTURE, BLOOD (ROUTINE X 2)

## 2010-11-14 LAB — B-NATRIURETIC PEPTIDE (CONVERTED LAB)
Pro B Natriuretic peptide (BNP): <30
Pro B Natriuretic peptide (BNP): <30

## 2010-11-14 LAB — BASIC METABOLIC PANEL WITH GFR
BUN: 43 — ABNORMAL HIGH
CO2: 26
Calcium: 9.4
Chloride: 105
Creatinine, Ser: 2.69 — ABNORMAL HIGH
GFR calc Af Amer: 30 — ABNORMAL LOW
GFR calc non Af Amer: 25 — ABNORMAL LOW
Glucose, Bld: 53 — ABNORMAL LOW
Potassium: 4.7
Sodium: 139

## 2010-11-14 LAB — BLOOD GAS, ARTERIAL
Bicarbonate: 23.2
O2 Content: 21
TCO2: 21.3
pCO2 arterial: 38
pH, Arterial: 7.402

## 2010-11-14 LAB — URINE CULTURE: Colony Count: 10000

## 2010-11-14 LAB — CSF CELL COUNT WITH DIFFERENTIAL
Eosinophils, CSF: 0
Lymphs, CSF: 90 — ABNORMAL HIGH
Monocyte-Macrophage-Spinal Fluid: 10 — ABNORMAL LOW
Segmented Neutrophils-CSF: 0
WBC, CSF: 23 — ABNORMAL HIGH

## 2010-11-14 LAB — PROTEIN AND GLUCOSE, CSF: Glucose, CSF: 49

## 2010-11-14 LAB — URINE MICROSCOPIC-ADD ON

## 2010-11-14 LAB — MAGNESIUM: Magnesium: 2

## 2010-11-14 LAB — HEMOGLOBIN AND HEMATOCRIT, BLOOD: Hemoglobin: 10.1 — ABNORMAL LOW

## 2010-11-17 LAB — URINE MICROSCOPIC-ADD ON

## 2010-11-17 LAB — DIFFERENTIAL
Basophils Absolute: 0.1
Eosinophils Absolute: 0.2
Eosinophils Relative: 3
Lymphocytes Relative: 19
Lymphs Abs: 1.4
Neutrophils Relative %: 63

## 2010-11-17 LAB — COMPREHENSIVE METABOLIC PANEL
AST: 19
CO2: 24
Calcium: 8.8
Chloride: 111
Creatinine, Ser: 0.94
GFR calc Af Amer: >60
GFR calc non Af Amer: >60
Glucose, Bld: 129 — ABNORMAL HIGH
Total Bilirubin: 0.6

## 2010-11-17 LAB — LIPASE, BLOOD: Lipase: 19

## 2010-11-17 LAB — POCT CARDIAC MARKERS
Myoglobin, poc: 50.7
Operator id: 4661

## 2010-11-17 LAB — URINALYSIS, ROUTINE W REFLEX MICROSCOPIC
Glucose, UA: NEGATIVE
Protein, ur: 30 — AB
Specific Gravity, Urine: 1.017

## 2010-11-17 LAB — CBC
HCT: 34.2 — ABNORMAL LOW
Hemoglobin: 11.3 — ABNORMAL LOW
MCHC: 32.9
MCV: 79.4
RBC: 4.31
WBC: 7.7

## 2010-11-21 LAB — COMPREHENSIVE METABOLIC PANEL
ALT: 20
ALT: 28
AST: 23
AST: 27
Albumin: 4.1
Albumin: 3.9
Alkaline Phosphatase: 45
Alkaline Phosphatase: 49
BUN: 17
CO2: 26
Calcium: 9.8
Calcium: 9.2
Chloride: 108
Creatinine, Ser: 0.86
GFR calc Af Amer: >60
GFR calc Af Amer: >60
GFR calc non Af Amer: >60
Glucose, Bld: 66 — ABNORMAL LOW
Glucose, Bld: 111 — ABNORMAL HIGH
Potassium: 3.6
Potassium: 3.2 — ABNORMAL LOW
Sodium: 139
Sodium: 141
Total Bilirubin: 0.6
Total Protein: 6.9
Total Protein: 7.1

## 2010-11-21 LAB — DIFFERENTIAL
Basophils Absolute: 0.1
Basophils Relative: 1
Basophils Relative: 1
Eosinophils Absolute: 0.2
Eosinophils Absolute: 0.2
Eosinophils Relative: 4
Eosinophils Relative: 4
Lymphocytes Relative: 27
Lymphs Abs: 1.5
Lymphs Abs: 1.7
Monocytes Absolute: 0.6
Monocytes Absolute: 0.6
Monocytes Relative: 11
Monocytes Relative: 10
Neutro Abs: 3
Neutrophils Relative %: 57
Neutrophils Relative %: 55

## 2010-11-21 LAB — URINE CULTURE: Culture: NO GROWTH

## 2010-11-21 LAB — CBC
HCT: 40.4
Hemoglobin: 12.9 — ABNORMAL LOW
Hemoglobin: 12.4 — ABNORMAL LOW
MCHC: 32
MCHC: 32
MCV: 79.2
Platelets: 211
RBC: 5.1
RDW: 15.8 — ABNORMAL HIGH
RDW: 15.8 — ABNORMAL HIGH
WBC: 5.4

## 2010-11-21 LAB — URINALYSIS, ROUTINE W REFLEX MICROSCOPIC
Bilirubin Urine: NEGATIVE
Glucose, UA: NEGATIVE
Hgb urine dipstick: NEGATIVE
Ketones, ur: NEGATIVE
Nitrite: NEGATIVE
Protein, ur: NEGATIVE
Specific Gravity, Urine: 1.011
Urobilinogen, UA: 1
Urobilinogen, UA: 0.2
pH: 7

## 2010-12-02 LAB — COMPREHENSIVE METABOLIC PANEL
Albumin: 2.9 — ABNORMAL LOW
Albumin: 3.5
Alkaline Phosphatase: 65
BUN: 20
BUN: 29 — ABNORMAL HIGH
CO2: 27
Calcium: 9.3
Chloride: 107
Chloride: 110
Creatinine, Ser: 1.24
GFR calc Af Amer: >60
GFR calc non Af Amer: >60
GFR calc non Af Amer: >60
Glucose, Bld: 118 — ABNORMAL HIGH
Potassium: 4.4
Total Bilirubin: 0.5
Total Bilirubin: 0.6

## 2010-12-02 LAB — SEDIMENTATION RATE: Sed Rate: 37 — ABNORMAL HIGH

## 2010-12-02 LAB — RHEUMATOID FACTOR: Rhuematoid fact SerPl-aCnc: <20

## 2010-12-02 LAB — URINALYSIS, ROUTINE W REFLEX MICROSCOPIC
Bilirubin Urine: NEGATIVE
Glucose, UA: NEGATIVE
Hgb urine dipstick: NEGATIVE
Ketones, ur: NEGATIVE
Protein, ur: NEGATIVE
Urobilinogen, UA: 0.2

## 2010-12-02 LAB — CBC
HCT: 29.1 — ABNORMAL LOW
HCT: 32.1 — ABNORMAL LOW
Hemoglobin: 9.6 — ABNORMAL LOW
MCHC: 33.2
MCV: 77.2 — ABNORMAL LOW
MCV: 77.9 — ABNORMAL LOW
Platelets: 342
RBC: 3.74 — ABNORMAL LOW
WBC: 6.4
WBC: 7.5

## 2010-12-02 LAB — DIFFERENTIAL
Basophils Absolute: 0.2 — ABNORMAL HIGH
Lymphocytes Relative: 32
Lymphs Abs: 2
Monocytes Absolute: 0.7
Neutro Abs: 3.3

## 2010-12-02 LAB — CK TOTAL AND CKMB (NOT AT ARMC)
CK, MB: 3.8
Relative Index: INVALID

## 2010-12-02 LAB — HEMOGLOBIN A1C: Mean Plasma Glucose: 158

## 2010-12-02 LAB — IRON AND TIBC
Iron: 84
TIBC: 259

## 2010-12-02 LAB — C4 COMPLEMENT: Complement C4, Body Fluid: 23

## 2010-12-02 LAB — HEPATITIS PANEL, ACUTE: Hepatitis B Surface Ag: NEGATIVE

## 2010-12-02 LAB — RPR: RPR Ser Ql: NONREACTIVE

## 2010-12-02 LAB — B. BURGDORFI ANTIBODIES: B burgdorferi Ab IgG+IgM: 0.05

## 2010-12-02 LAB — C3 COMPLEMENT: C3 Complement: 120

## 2010-12-03 LAB — CBC
HCT: 24.2 — ABNORMAL LOW
HCT: 27.2 — ABNORMAL LOW
HCT: 30.4 — ABNORMAL LOW
HCT: 30.4 — ABNORMAL LOW
HCT: 30.5 — ABNORMAL LOW
HCT: 32.9 — ABNORMAL LOW
Hemoglobin: 10.4 — ABNORMAL LOW
Hemoglobin: 8.1 — ABNORMAL LOW
MCHC: 33.6
MCHC: 33.7
MCHC: 33.7
MCHC: 33.9
MCV: 77.6 — ABNORMAL LOW
MCV: 77.8 — ABNORMAL LOW
MCV: 77.9 — ABNORMAL LOW
MCV: 78.5
MCV: 78.8
Platelets: 215
Platelets: 220
Platelets: 230
Platelets: 242
RBC: 3.51 — ABNORMAL LOW
RBC: 4.18 — ABNORMAL LOW
RDW: 16.6 — ABNORMAL HIGH
RDW: 16.6 — ABNORMAL HIGH
RDW: 16.7 — ABNORMAL HIGH
RDW: 17 — ABNORMAL HIGH
WBC: 10.6 — ABNORMAL HIGH
WBC: 11 — ABNORMAL HIGH
WBC: 7.3

## 2010-12-03 LAB — BASIC METABOLIC PANEL
BUN: 6
Chloride: 109
Potassium: 3.9

## 2010-12-03 LAB — RAPID URINE DRUG SCREEN, HOSP PERFORMED
Amphetamines: NOT DETECTED
Barbiturates: NOT DETECTED
Opiates: NOT DETECTED

## 2010-12-03 LAB — URINALYSIS, ROUTINE W REFLEX MICROSCOPIC
Bilirubin Urine: NEGATIVE
Glucose, UA: NEGATIVE
Hgb urine dipstick: NEGATIVE
Protein, ur: NEGATIVE

## 2010-12-03 LAB — COMPREHENSIVE METABOLIC PANEL
ALT: 20
AST: 14
Alkaline Phosphatase: 50
BUN: 10
BUN: 21
CO2: 25
Calcium: 7.2 — ABNORMAL LOW
Calcium: 8.4
Chloride: 105
Creatinine, Ser: 0.89
Creatinine, Ser: 1.06
GFR calc non Af Amer: >60
GFR calc non Af Amer: >60
Glucose, Bld: 89
Total Bilirubin: 0.4
Total Bilirubin: 0.6
Total Protein: 6.6

## 2010-12-03 LAB — CULTURE, BLOOD (ROUTINE X 2)
Culture: NO GROWTH
Culture: NO GROWTH

## 2010-12-03 LAB — URINE CULTURE
Colony Count: NO GROWTH
Culture: NO GROWTH

## 2010-12-03 LAB — DIFFERENTIAL
Basophils Absolute: 0
Basophils Absolute: 0
Eosinophils Relative: 1
Lymphocytes Relative: 12
Lymphocytes Relative: 18
Monocytes Absolute: 0.3
Neutro Abs: 9.1 — ABNORMAL HIGH

## 2010-12-03 LAB — CROSSMATCH: ABO/RH(D): O POS

## 2010-12-04 LAB — I-STAT 8, (EC8 V) (CONVERTED LAB)
BUN: 19
Bicarbonate: 27.3 — ABNORMAL HIGH
Glucose, Bld: 68 — ABNORMAL LOW
pCO2, Ven: 53.6 — ABNORMAL HIGH

## 2010-12-04 LAB — CBC
HCT: 32.7 — ABNORMAL LOW
HCT: 30.7 — ABNORMAL LOW
Hemoglobin: 10.9 — ABNORMAL LOW
Hemoglobin: 10.2 — ABNORMAL LOW
MCHC: 33.3
MCHC: 33.3
MCV: 77.9 — ABNORMAL LOW
RBC: 3.91 — ABNORMAL LOW
RDW: 16.5 — ABNORMAL HIGH
RDW: 16.2 — ABNORMAL HIGH

## 2010-12-04 LAB — DIFFERENTIAL
Basophils Absolute: 0.2 — ABNORMAL HIGH
Basophils Absolute: 0
Basophils Relative: 2 — ABNORMAL HIGH
Eosinophils Relative: 1
Eosinophils Relative: 1
Lymphocytes Relative: 30
Monocytes Absolute: 0.8 — ABNORMAL HIGH
Monocytes Absolute: 0.5
Monocytes Relative: 7

## 2010-12-04 LAB — URINALYSIS, ROUTINE W REFLEX MICROSCOPIC
Bilirubin Urine: NEGATIVE
Glucose, UA: NEGATIVE
Ketones, ur: 15 — AB
pH: 6

## 2010-12-08 LAB — COMPREHENSIVE METABOLIC PANEL
AST: 22
Albumin: 4.2
BUN: 26 — ABNORMAL HIGH
Calcium: 10.2
Creatinine, Ser: 1.14
GFR calc Af Amer: >60

## 2010-12-08 LAB — DIFFERENTIAL
Basophils Absolute: 0
Eosinophils Relative: 2
Lymphocytes Relative: 35
Lymphs Abs: 2.1
Monocytes Absolute: 0.5
Neutro Abs: 3.2

## 2010-12-08 LAB — POCT URINALYSIS DIP (DEVICE)
Hgb urine dipstick: NEGATIVE
Ketones, ur: NEGATIVE
Operator id: 247071
Protein, ur: NEGATIVE
Specific Gravity, Urine: 1.015
Urobilinogen, UA: 0.2
pH: 6

## 2010-12-08 LAB — SEDIMENTATION RATE: Sed Rate: 30 — ABNORMAL HIGH

## 2010-12-08 LAB — CBC
HCT: 30.8 — ABNORMAL LOW
HCT: 39.1
MCHC: 32.4
MCHC: 33.4
MCV: 79.5
MCV: 80.3
Platelets: 260
RBC: 3.88 — ABNORMAL LOW
RDW: 15.6 — ABNORMAL HIGH

## 2010-12-09 ENCOUNTER — Encounter: Payer: Self-pay | Admitting: *Deleted

## 2010-12-09 LAB — UIFE/LIGHT CHAINS/TP QN, 24-HR UR
Albumin, U: DETECTED
Beta, Urine: NOT DETECTED
Free Kappa Lt Chains,Ur: 6.86 — ABNORMAL HIGH (ref 0.04–1.51)
Free Lambda Excretion/Day: 16.96
Free Lambda Lt Chains,Ur: 0.53 (ref 0.08–1.01)
Free Lt Chn Excr Rate: 219.52
Gamma Globulin, Urine: DETECTED — AB
Time: 24
Total Protein, Urine-Ur/day: 246 — ABNORMAL HIGH (ref 10–140)

## 2010-12-09 LAB — CBC
HCT: 26 — ABNORMAL LOW
HCT: 26.1 — ABNORMAL LOW
HCT: 27.6 — ABNORMAL LOW
Hemoglobin: 8.6 — ABNORMAL LOW
Hemoglobin: 8.6 — ABNORMAL LOW
Hemoglobin: 9.1 — ABNORMAL LOW
Hemoglobin: 9.3 — ABNORMAL LOW
MCHC: 33.1
MCHC: 33.2
MCHC: 33.3
MCHC: 33.3
MCHC: 33.5
MCHC: 33.5
MCV: 78.9
MCV: 79
MCV: 79.2
MCV: 79.3
MCV: 80.1
MCV: 80.4
Platelets: 205
Platelets: 209
Platelets: 224
Platelets: 286
Platelets: 288
RBC: 2.94 — ABNORMAL LOW
RBC: 3.26 — ABNORMAL LOW
RBC: 3.28 — ABNORMAL LOW
RBC: 3.61 — ABNORMAL LOW
RDW: 14.5 — ABNORMAL HIGH
RDW: 14.6 — ABNORMAL HIGH
RDW: 14.8 — ABNORMAL HIGH
RDW: 15 — ABNORMAL HIGH
WBC: 5.2
WBC: 6
WBC: 6.7

## 2010-12-09 LAB — COMPREHENSIVE METABOLIC PANEL
ALT: 14
Albumin: 2.9 — ABNORMAL LOW
BUN: 11
BUN: 33 — ABNORMAL HIGH
CO2: 26
Calcium: 9
Calcium: 9
Creatinine, Ser: 1.16
Creatinine, Ser: 1.81 — ABNORMAL HIGH
GFR calc non Af Amer: 39 — ABNORMAL LOW
Glucose, Bld: 191 — ABNORMAL HIGH
Glucose, Bld: 84
Total Protein: 6.1

## 2010-12-09 LAB — CROSSMATCH: ABO/RH(D): O POS

## 2010-12-09 LAB — BASIC METABOLIC PANEL
BUN: 6
CO2: 24
CO2: 25
CO2: 26
Calcium: 8.8
Calcium: 9.4
Chloride: 110
Chloride: 111
Creatinine, Ser: 1.11
Creatinine, Ser: 1.15
GFR calc Af Amer: >60
GFR calc Af Amer: >60
Glucose, Bld: 93
Glucose, Bld: 98
Potassium: 3.4 — ABNORMAL LOW
Sodium: 139
Sodium: 139
Sodium: 141

## 2010-12-09 LAB — URINALYSIS, ROUTINE W REFLEX MICROSCOPIC
Glucose, UA: NEGATIVE
Hgb urine dipstick: NEGATIVE
Ketones, ur: NEGATIVE
Nitrite: NEGATIVE
Protein, ur: NEGATIVE
Protein, ur: NEGATIVE
Specific Gravity, Urine: 1.022
Urobilinogen, UA: 0.2
pH: 6

## 2010-12-09 LAB — I-STAT 8, (EC8 V) (CONVERTED LAB)
BUN: 35 — ABNORMAL HIGH
Bicarbonate: 22
Bicarbonate: 24.2 — ABNORMAL HIGH
Chloride: 105
Glucose, Bld: 100 — ABNORMAL HIGH
Glucose, Bld: 204 — ABNORMAL HIGH
HCT: 32 — ABNORMAL LOW
Hemoglobin: 10.9 — ABNORMAL LOW
Operator id: 151321
Potassium: 3.5
Sodium: 141
TCO2: 23
pH, Ven: 7.398 — ABNORMAL HIGH

## 2010-12-09 LAB — FOLATE
Folate: 11.9
Folate: 9.9

## 2010-12-09 LAB — IMMUNOFIXATION ELECTROPHORESIS
IgA: 167
IgG (Immunoglobin G), Serum: 1540
IgG (Immunoglobin G), Serum: 1760 — ABNORMAL HIGH
IgM, Serum: 28 — ABNORMAL LOW
IgM, Serum: 28 — ABNORMAL LOW
Total Protein ELP: 7
Total Protein ELP: 7.7

## 2010-12-09 LAB — POCT I-STAT CREATININE
Creatinine, Ser: 2.6 — ABNORMAL HIGH
Creatinine, Ser: 3.2 — ABNORMAL HIGH
Operator id: 198171

## 2010-12-09 LAB — FERRITIN: Ferritin: 306 (ref 22–322)

## 2010-12-09 LAB — DIFFERENTIAL
Basophils Absolute: 0.1
Eosinophils Absolute: 0.2
Eosinophils Absolute: 0.2
Eosinophils Relative: 4
Lymphocytes Relative: 31
Lymphocytes Relative: 31
Lymphs Abs: 1.9
Monocytes Relative: 9
Neutro Abs: 3.4
Neutrophils Relative %: 55
Neutrophils Relative %: 56

## 2010-12-09 LAB — HEPATITIS PANEL, ACUTE
Hep A IgM: NEGATIVE
Hep B C IgM: NEGATIVE
Hepatitis B Surface Ag: NEGATIVE

## 2010-12-09 LAB — PROTEIN ELECTROPH W RFLX QUANT IMMUNOGLOBULINS
Albumin ELP: 50.4 — ABNORMAL LOW
Alpha-2-Globulin: 11.5
Beta Globulin: 5.8
Total Protein ELP: 6.7

## 2010-12-09 LAB — IRON AND TIBC
Iron: 55
Iron: 62
Saturation Ratios: 23
UIBC: 189
UIBC: 223

## 2010-12-09 LAB — CARDIAC PANEL(CRET KIN+CKTOT+MB+TROPI)
CK, MB: 2
Relative Index: 1.4
Total CK: 148

## 2010-12-09 LAB — CULTURE, BLOOD (ROUTINE X 2): Culture: NO GROWTH

## 2010-12-09 LAB — TSH: TSH: 1.109

## 2010-12-09 LAB — RETICULOCYTES
RBC.: 3.65 — ABNORMAL LOW
RBC.: 3.69 — ABNORMAL LOW
Retic Ct Pct: 0.4
Retic Ct Pct: 0.5
Retic Ct Pct: 0.6

## 2010-12-09 LAB — SAVE SMEAR

## 2010-12-09 LAB — CK
Total CK: 304 — ABNORMAL HIGH
Total CK: 306 — ABNORMAL HIGH

## 2010-12-09 LAB — ABO/RH: ABO/RH(D): O POS

## 2010-12-09 LAB — VITAMIN B12: Vitamin B-12: 435 (ref 211–911)

## 2010-12-09 LAB — RAPID URINE DRUG SCREEN, HOSP PERFORMED: Cocaine: POSITIVE — AB

## 2010-12-09 LAB — HEMOGLOBIN A1C: Hgb A1c MFr Bld: 7.1 — ABNORMAL HIGH

## 2010-12-09 LAB — OCCULT BLOOD X 1 CARD TO LAB, STOOL: Fecal Occult Bld: NEGATIVE

## 2010-12-17 ENCOUNTER — Encounter: Payer: Self-pay | Admitting: *Deleted

## 2010-12-31 ENCOUNTER — Encounter: Payer: Self-pay | Admitting: Hematology & Oncology

## 2010-12-31 ENCOUNTER — Other Ambulatory Visit: Payer: Self-pay | Admitting: Hematology & Oncology

## 2010-12-31 DIAGNOSIS — M2550 Pain in unspecified joint: Secondary | ICD-10-CM

## 2010-12-31 DIAGNOSIS — C9000 Multiple myeloma not having achieved remission: Secondary | ICD-10-CM

## 2010-12-31 DIAGNOSIS — E119 Type 2 diabetes mellitus without complications: Secondary | ICD-10-CM | POA: Insufficient documentation

## 2010-12-31 HISTORY — DX: Multiple myeloma not having achieved remission: C90.00

## 2010-12-31 HISTORY — DX: Pain in unspecified joint: M25.50

## 2011-01-01 ENCOUNTER — Ambulatory Visit (HOSPITAL_BASED_OUTPATIENT_CLINIC_OR_DEPARTMENT_OTHER): Payer: Medicaid Other

## 2011-01-01 ENCOUNTER — Other Ambulatory Visit: Payer: Self-pay | Admitting: Hematology & Oncology

## 2011-01-01 ENCOUNTER — Other Ambulatory Visit: Payer: Medicaid Other | Admitting: Lab

## 2011-01-01 ENCOUNTER — Ambulatory Visit (HOSPITAL_BASED_OUTPATIENT_CLINIC_OR_DEPARTMENT_OTHER): Payer: Medicaid Other | Admitting: Hematology & Oncology

## 2011-01-01 VITALS — BP 148/85 | HR 80 | Temp 97.0°F | Ht 69.0 in | Wt 193.0 lb

## 2011-01-01 DIAGNOSIS — C9001 Multiple myeloma in remission: Secondary | ICD-10-CM

## 2011-01-01 DIAGNOSIS — C9000 Multiple myeloma not having achieved remission: Secondary | ICD-10-CM

## 2011-01-01 DIAGNOSIS — D649 Anemia, unspecified: Secondary | ICD-10-CM

## 2011-01-01 DIAGNOSIS — D509 Iron deficiency anemia, unspecified: Secondary | ICD-10-CM

## 2011-01-01 DIAGNOSIS — Z23 Encounter for immunization: Secondary | ICD-10-CM

## 2011-01-01 DIAGNOSIS — N289 Disorder of kidney and ureter, unspecified: Secondary | ICD-10-CM

## 2011-01-01 LAB — COMPREHENSIVE METABOLIC PANEL
AST: 18 U/L (ref 0–37)
Albumin: 4.3 g/dL (ref 3.5–5.2)
Alkaline Phosphatase: 60 U/L (ref 39–117)
Glucose, Bld: 177 mg/dL — ABNORMAL HIGH (ref 70–99)
Potassium: 3.5 mEq/L (ref 3.5–5.3)
Sodium: 138 mEq/L (ref 135–145)
Total Protein: 8.2 g/dL (ref 6.0–8.3)

## 2011-01-01 LAB — CBC WITH DIFFERENTIAL (CANCER CENTER ONLY)
EOS%: 2.5 % (ref 0.0–7.0)
Eosinophils Absolute: 0.2 10*3/uL (ref 0.0–0.5)
LYMPH#: 2 10*3/uL (ref 0.9–3.3)
MCH: 25.7 pg — ABNORMAL LOW (ref 28.0–33.4)
MONO%: 9.7 % (ref 0.0–13.0)
NEUT#: 3.9 10*3/uL (ref 1.5–6.5)
Platelets: 240 10*3/uL (ref 145–400)
RBC: 4.17 10*6/uL — ABNORMAL LOW (ref 4.20–5.70)

## 2011-01-01 MED ORDER — HYDROMORPHONE HCL PF 4 MG/ML IJ SOLN
4.0000 mg | Freq: Once | INTRAMUSCULAR | Status: AC
  Administered 2011-01-01: 4 mg via INTRAVENOUS

## 2011-01-01 MED ORDER — HYDROMORPHONE HCL PF 4 MG/ML IJ SOLN: 4.0000 mg | Freq: Once | INTRAMUSCULAR | Status: DC

## 2011-01-01 MED ORDER — SODIUM CHLORIDE 0.9 % IV SOLN
90.0000 mg | Freq: Once | INTRAVENOUS | Status: AC
  Administered 2011-01-01: 90 mg via INTRAVENOUS
  Filled 2011-01-01: qty 500

## 2011-01-01 MED ORDER — HYDROMORPHONE HCL 4 MG/ML IJ SOLN: 4.0000 mg | Freq: Once | INTRAMUSCULAR | Status: DC

## 2011-01-01 MED ORDER — OXYCODONE-ACETAMINOPHEN 5-325 MG PO TABS
1.0000 | ORAL_TABLET | Freq: Once | ORAL | Status: AC
Start: 2011-01-01 — End: 2011-01-01
  Administered 2011-01-01: 1 via ORAL

## 2011-01-01 MED ORDER — SODIUM CHLORIDE 0.9 % IV SOLN
Freq: Once | INTRAVENOUS | Status: AC
  Administered 2011-01-01: 09:00:00 via INTRAVENOUS

## 2011-01-01 MED ORDER — INFLUENZA VIRUS VACC SPLIT PF IM SUSP: 0.5000 mL | Freq: Once | INTRAMUSCULAR | Status: DC

## 2011-01-01 MED ORDER — HEPARIN SOD (PORK) LOCK FLUSH 100 UNIT/ML IV SOLN
500.0000 [IU] | Freq: Once | INTRAVENOUS | Status: AC | PRN
  Administered 2011-01-01: 500 [IU]
  Filled 2011-01-01: qty 5

## 2011-01-01 MED ORDER — INFLUENZA VIRUS VACC SPLIT PF IM SUSP
0.5000 mL | INTRAMUSCULAR | Status: AC
  Administered 2011-01-01: 0.5 mL via INTRAMUSCULAR
  Filled 2011-01-01: qty 0.5

## 2011-01-01 MED ORDER — SODIUM CHLORIDE 0.9 % IJ SOLN
10.0000 mL | INTRAMUSCULAR | Status: DC | PRN
  Administered 2011-01-01: 10 mL
  Filled 2011-01-01: qty 10

## 2011-01-01 NOTE — Progress Notes (Signed)
DIAGNOSES: 1. IgG kappa myeloma, in remission. 2. Severe fibromyalgia. 3. Insulin-dependent diabetes. 4. 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  CURRENT THERAPY: 1. Aredia 90 mg IV every 2 months. 2. Aranesp 300 mcg subcutaneously as needed for a hemoglobin less than     11.  INTERIM HISTORY:  [name] comes in for followup.  Pain continues to be his problem.  He has bad fibromyalgia.  He has seen several doctors for this.  It does not seem like much can be done to help him out. Apparently when we last saw him in September, there was no monoclonal spike in his serum.  His beta 2 microglobulin was elevated at 3.16.  REVIEW OF SYSTEMS:  He has had no fever.  His appetite has been okay. He has had no coughing or shortness of breath.  PHYSICAL EXAMINATION:  This is a well-developed, well-nourished black gentleman in no obvious distress.  Vital Signs:  97, pulse 80, respiratory rate 18, blood pressure 140/85, weight is 193.  Head and neck exam shows a normocephalic, atraumatic skull.  There are no ocular or oral lesions.  He has no adenopathy in his neck.  Lungs are clear bilaterally.  Cardiac:  Regular rate and rhythm with normal S1 and S2. There are no murmurs, rubs, or bruits.  Abdomen:  Soft with good bowel sounds.  There is no palpable abdominal mass.  There is no palpable hepatosplenomegaly.  [name]:  No tenderness of the spine, ribs, or hips. Extremities:  No clubbing, cyanosis, or edema.  He does have tenderness over his long bones.  Skin:  No rashes, ecchymoses or petechiae.  LABORATORY STUDIES:  White cell count 6.8, hemoglobin 10.7, hematocrit 31.3, platelet count 240.  IMPRESSION:  [name] is a 60 year old African American gentleman with history of IgG kappa myeloma.  This really has not been a problem for him.  He has not had any treatment for this.  He had been on treatment with Velcade.  He had a lot of dizziness with the Velcade so we went ahead and  stopped it.  He is on the Aredia.  We will go ahead and give him his dose today.  I did not do not think he needs any Aranesp.  We will plan to get [name] in another couple of months.    ______________________________ Volanda Napoleon, M.D. PRE/MEDQ  D:  01/01/2011  T:  01/01/2011  Job:  [id number]

## 2011-01-01 NOTE — Progress Notes (Signed)
Addended by: Burney Gauze R on: 01/01/2011 10:03 AM   Modules accepted: Orders

## 2011-01-01 NOTE — Progress Notes (Signed)
CSN: [id number] ENNEVER,PETER R 01/01/2011  Dict #:  [id number].

## 2011-01-01 NOTE — Progress Notes (Signed)
Addended by: Tora Kindred on: 01/01/2011 10:17 AM   Modules accepted: Orders

## 2011-01-01 NOTE — Progress Notes (Signed)
Addended by: Burney Gauze R on: 01/01/2011 08:41 AM   Modules accepted: Orders

## 2011-01-02 LAB — IRON AND TIBC
Iron: 69 ug/dL (ref 42–165)
UIBC: 195 ug/dL (ref 125–400)

## 2011-01-02 LAB — COMPREHENSIVE METABOLIC PANEL
Albumin: 4.3 g/dL (ref 3.5–5.2)
BUN: 26 mg/dL — ABNORMAL HIGH (ref 6–23)
Calcium: 9.1 mg/dL (ref 8.4–10.5)
Chloride: 95 mEq/L — ABNORMAL LOW (ref 96–112)
Glucose, Bld: 177 mg/dL — ABNORMAL HIGH (ref 70–99)
Potassium: 3.5 mEq/L (ref 3.5–5.3)

## 2011-01-05 ENCOUNTER — Telehealth: Payer: Self-pay | Admitting: *Deleted

## 2011-01-05 NOTE — Progress Notes (Signed)
Attempted to call patient no answering machine no message left

## 2011-01-05 NOTE — Telephone Encounter (Signed)
Message copied by Rico Ala on Mon Jan 05, 2011 11:25 AM ------      Message from: Burney Gauze R      Created: Thu Jan 01, 2011  4:47 PM       Call and let him know that labs ok.

## 2011-01-06 LAB — COMPREHENSIVE METABOLIC PANEL
ALT: 13 U/L (ref 0–53)
AST: 18 U/L (ref 0–37)
Albumin: 4.3 g/dL (ref 3.5–5.2)
Alkaline Phosphatase: 60 U/L (ref 39–117)
Calcium: 9.1 mg/dL (ref 8.4–10.5)
Potassium: 3.5 mEq/L (ref 3.5–5.3)
Sodium: 138 mEq/L (ref 135–145)
Total Protein: 8.2 g/dL (ref 6.0–8.3)

## 2011-01-06 LAB — PROTEIN ELECTROPHORESIS, SERUM
Albumin ELP: 49.8 % — ABNORMAL LOW (ref 55.8–66.1)
Alpha-2-Globulin: 14.2 % — ABNORMAL HIGH (ref 7.1–11.8)
Beta Globulin: 5.2 % (ref 4.7–7.2)
Total Protein, Serum Electrophoresis: 8.2 g/dL (ref 6.0–8.3)

## 2011-01-06 LAB — IRON AND TIBC
%SAT: 26 % (ref 20–55)
UIBC: 195 ug/dL (ref 125–400)

## 2011-01-06 LAB — KAPPA/LAMBDA LIGHT CHAINS
Kappa free light chain: 3.13 mg/dL — ABNORMAL HIGH (ref 0.33–1.94)
Kappa:Lambda Ratio: 2.61 — ABNORMAL HIGH (ref 0.26–1.65)
Lambda Free Lght Chn: 1.2 mg/dL (ref 0.57–2.63)

## 2011-01-24 HISTORY — PX: TRANSURETHRAL RESECTION OF PROSTATE: SHX73

## 2011-02-02 NOTE — Telephone Encounter (Signed)
Unable to reach patient to let him know labs were ok.

## 2011-03-12 ENCOUNTER — Other Ambulatory Visit (HOSPITAL_BASED_OUTPATIENT_CLINIC_OR_DEPARTMENT_OTHER): Payer: Medicaid Other | Admitting: Lab

## 2011-03-12 ENCOUNTER — Encounter: Payer: Self-pay | Admitting: Hematology & Oncology

## 2011-03-12 ENCOUNTER — Ambulatory Visit (HOSPITAL_BASED_OUTPATIENT_CLINIC_OR_DEPARTMENT_OTHER): Payer: Medicaid Other

## 2011-03-12 ENCOUNTER — Ambulatory Visit (HOSPITAL_BASED_OUTPATIENT_CLINIC_OR_DEPARTMENT_OTHER): Payer: Medicaid Other | Admitting: Hematology & Oncology

## 2011-03-12 DIAGNOSIS — N189 Chronic kidney disease, unspecified: Secondary | ICD-10-CM

## 2011-03-12 DIAGNOSIS — D631 Anemia in chronic kidney disease: Secondary | ICD-10-CM

## 2011-03-12 DIAGNOSIS — D649 Anemia, unspecified: Secondary | ICD-10-CM

## 2011-03-12 DIAGNOSIS — C9000 Multiple myeloma not having achieved remission: Secondary | ICD-10-CM

## 2011-03-12 DIAGNOSIS — D509 Iron deficiency anemia, unspecified: Secondary | ICD-10-CM

## 2011-03-12 HISTORY — DX: Anemia in chronic kidney disease: D63.1

## 2011-03-12 HISTORY — DX: Chronic kidney disease, unspecified: N18.9

## 2011-03-12 HISTORY — DX: Iron deficiency anemia, unspecified: D50.9

## 2011-03-12 LAB — CBC WITH DIFFERENTIAL (CANCER CENTER ONLY)
Eosinophils Absolute: 0.2 10*3/uL (ref 0.0–0.5)
MCV: 75 fL — ABNORMAL LOW (ref 82–98)
MONO#: 0.8 10*3/uL (ref 0.1–0.9)
NEUT#: 3.9 10*3/uL (ref 1.5–6.5)
Platelets: 218 10*3/uL (ref 145–400)
RBC: 3.93 10*6/uL — ABNORMAL LOW (ref 4.20–5.70)
WBC: 7.3 10*3/uL (ref 4.0–10.0)

## 2011-03-12 MED ORDER — DARBEPOETIN ALFA-POLYSORBATE 300 MCG/0.6ML IJ SOLN
300.0000 ug | Freq: Once | INTRAMUSCULAR | Status: AC
  Administered 2011-03-12: 300 ug via SUBCUTANEOUS

## 2011-03-12 MED ORDER — SODIUM CHLORIDE 0.9 % IV SOLN
90.0000 mg | Freq: Once | INTRAVENOUS | Status: AC
  Administered 2011-03-12: 90 mg via INTRAVENOUS
  Filled 2011-03-12: qty 500

## 2011-03-12 MED ORDER — SODIUM CHLORIDE 0.9 % IJ SOLN
10.0000 mL | INTRAMUSCULAR | Status: DC | PRN
  Filled 2011-03-12: qty 10

## 2011-03-12 MED ORDER — SODIUM CHLORIDE 0.9 % IV SOLN: Freq: Once | INTRAVENOUS | Status: DC

## 2011-03-12 MED ORDER — SODIUM CHLORIDE 0.9 % IV SOLN
1020.0000 mg | Freq: Once | INTRAVENOUS | Status: AC
  Administered 2011-03-12: 1020 mg via INTRAVENOUS
  Filled 2011-03-12: qty 34

## 2011-03-12 MED ORDER — HEPARIN SOD (PORK) LOCK FLUSH 100 UNIT/ML IV SOLN
250.0000 [IU] | Freq: Once | INTRAVENOUS | Status: DC | PRN
  Filled 2011-03-12: qty 5

## 2011-03-12 NOTE — Progress Notes (Signed)
This office note has been dictated.

## 2011-03-12 NOTE — Progress Notes (Signed)
DIAGNOSES: 1. IgG kappa myeloma, in clinical remission. 2. Anemia secondary to iron deficiency. 3. Anemia secondary to renal insufficiency. 4. Insulin-dependent diabetes. 5. Fibromyalgia.  CURRENT THERAPY: 1. Aredia 90 mg IV q.2 months. 2. Aranesp 300 mcg subcu as needed for hemoglobin less than 11. 3. IV iron as indicated.  INTERIM HISTORY:  [name] comes in for followup.  He does feel tired.  He has had some prostate issues.  He had prostate surgery about a month ago.  He had BPH.  He is still having issues with this.  He lost some blood with the procedure.  He has had no issues with respect to his myeloma.  When we last checked his myeloma studies, there was no monoclonal protein noted in his serum. His IgG level was 1910 mg/dL.  His last iron studies in November showed an iron saturation of 26%.  His total iron was 69.  He has had chronic problems with fibromyalgia.  PHYSICAL EXAM:  General:  This is a well-developed, well-nourished black gentleman in no obvious distress.  Vital Signs:  Temperature 97.8, pulse 81, respiratory rate 16, blood pressure 155/79.  Weight is 193. Head/Neck:  Exam shows a normocephalic, atraumatic skull.  There are no ocular or oral lesions.  There are no palpable cervical or supraclavicular lymph nodes.  Lungs:  Clear to percussion and auscultation bilaterally.  Cardiac:  Regular rate and rhythm with a normal S1, S2.  No murmurs, rubs or bruits.  Abdomen:  Soft with good bowel sounds.  There is no palpable abdominal mass.  There is no fluid wave.  There is no palpable hepatosplenomegaly.  Back:  Exam shows no tenderness over the spine, ribs, or hips.  Extremities:  No clubbing, cyanosis or edema.  He is tender over most of his long bones and joints. Skin:  No rashes, ecchymosis or petechiae.  LABORATORY STUDIES:  White cell count of 7.3, hemoglobin 9.8, hematocrit 29.6, platelet count 218.  IMPRESSION:  [name] is a 61 year old  gentleman with IgG kappa myeloma.  He had been treated for this in the past.  We have not had to treat him for quite a while.  His last treatment was with Velcade and Cytoxan back in January 2012.  He had complications from this.  We will go ahead and give him some iron today.  I will also give him a dose of Aranesp.  I think his last Aranesp dose was back in September.  [name] has been bothered by his fibromyalgia.  He also has chronic neuropathy from his diabetes.  We will go ahead and plan to see him back in another couple months for followup.    ______________________________ Volanda Napoleon, M.D. PRE/MEDQ  D:  03/12/2011  T:  03/12/2011  Job:  [id number]

## 2011-03-13 LAB — COMPREHENSIVE METABOLIC PANEL
Alkaline Phosphatase: 65 U/L (ref 39–117)
Glucose, Bld: 141 mg/dL — ABNORMAL HIGH (ref 70–99)
Sodium: 137 mEq/L (ref 135–145)
Total Bilirubin: 0.2 mg/dL — ABNORMAL LOW (ref 0.3–1.2)
Total Protein: 8 g/dL (ref 6.0–8.3)

## 2011-03-13 LAB — IGM: IgM, Serum: 85 mg/dL (ref 41–251)

## 2011-03-13 LAB — KAPPA/LAMBDA LIGHT CHAINS: Kappa free light chain: 4.62 mg/dL — ABNORMAL HIGH (ref 0.33–1.94)

## 2011-03-13 LAB — IGG: IgG (Immunoglobin G), Serum: 2120 mg/dL — ABNORMAL HIGH (ref 650–1600)

## 2011-03-13 LAB — VITAMIN D 25 HYDROXY (VIT D DEFICIENCY, FRACTURES): Vit D, 25-Hydroxy: 38 ng/mL (ref 30–89)

## 2011-04-08 ENCOUNTER — Ambulatory Visit (INDEPENDENT_AMBULATORY_CARE_PROVIDER_SITE_OTHER): Payer: Medicaid Other | Admitting: Nurse Practitioner

## 2011-04-08 ENCOUNTER — Encounter: Payer: Self-pay | Admitting: Nurse Practitioner

## 2011-04-08 ENCOUNTER — Telehealth: Payer: Self-pay | Admitting: *Deleted

## 2011-04-08 VITALS — BP 110/72 | HR 80 | Ht 71.0 in | Wt 192.0 lb

## 2011-04-08 DIAGNOSIS — K220 Achalasia of cardia: Secondary | ICD-10-CM

## 2011-04-08 MED ORDER — ESOMEPRAZOLE MAGNESIUM 40 MG PO CPDR: 40.0000 mg | DELAYED_RELEASE_CAPSULE | Freq: Every day | ORAL | Status: DC

## 2011-04-08 NOTE — Patient Instructions (Signed)
We have scheduled you for a Barium Swallow test at [facility]. Arrive at 10:15. There are no dietary restrictions.     We will call you with an appointment either at [facility] or [place].   We are making a referral for you.   We have given you samples of Nexium capsules that you can take 1 twice daily. You can break them open and put the contents in applesauce.   Stop the Dexilant for now.

## 2011-04-08 NOTE — Telephone Encounter (Signed)
Called [facility] department,  Had to leave message for someone to call me regarding referral appt for this patient.  They left me a fax number of [phone].  I LM for someone to please call me.

## 2011-04-08 NOTE — Progress Notes (Addendum)
[name] [id number] 09/30/1950   HISTORY OR PRESENT ILLNESS :  Patient is a 61 year old male with a history of HTN, renal insufficiency, diabetes and multiple myeloma. He is known to Dr. Fuller Plan for chronic dysphagia and was last seen in August 2011. He walked into the lobby today requesting to be seen today instead of waiting on appointment next week with Dr. Fuller Plan. He complains of dysphagia to solids and liquids.   In 2010 an upper endoscopy showed a normal esophagus. Patient was empirically dilated at time of EGD. Mr.Soltero denies ever having had an EGD with [place], he is positive of it. In Feb.2011 a barium swallow with tablet showed nonspecific esophageal motility disorder with transient hold up of barium tablet. For further investigation patient had an esophageal manometry in March 2011 with findings of a hypertensive LES. His last visit was in August and that was for persistent solid food dysphasia. Patient was advised to continue a soft diet and a proton pump inhibitor with plans for tertiary care center referral if no improvement.  Current Medications, Allergies, Past Medical History, Past Surgical History, Family History and Social History were reviewed in [employer] record.   PHYSICAL EXAMINATION : General: Well developed  Black male in no acute distress Head: Normocephalic and atraumatic Eyes:  sclerae anicteric,conjunctive pink. Ears: Normal auditory acuity Neck: Supple, no masses.  Lungs: Clear throughout to auscultation Heart: Regular rate and rhythm; no murmurs heard Abdomen: Soft, nondistended, nontender. No masses or hepatomegaly noted. Normal bowel sounds Rectal: not done Musculoskeletal: Symmetrical with no gross deformities  Skin: No lesions on visible extremities Extremities: No edema or deformities noted Neurological: Oriented x 4, grossly nonfocal Cervical Nodes:  No significant cervical adenopathy Psychological:  Alert and cooperative.  Normal mood and affect  ASSESSMENT AND PLAN :   Persistent dysphagia to both solids and liquids. Known history of hypertensive lower esophageal sphincter. Will repeat esophagram to see if this has evolved into achalasia which could be treated with a myotomy. Otherwise treatment options are limited. We could try Diltiazem but patient is already on a calcium channel blocker. For now, await esophagram results and change PPI to Nexium which can be dissolved in applesauce. I will proceed with a referral to an esophageal motility specialist.   Addendum Reviewed and agree with management. I agree with repeat barium esophagram.  Previous mano reviewed and he did have peristalsis which was not consistent with achalasia at that time.  However, he did have a hypertensive LES, which did not respond to dilation Options at this point will be per Dr. Fuller Plan, and may include attempt at Botox injection or referral to tertiary center for further therapy Await imaging results Lajuan Lines. Pyrtle, M.D.  04/09/2011

## 2011-04-09 ENCOUNTER — Encounter: Payer: Self-pay | Admitting: Nurse Practitioner

## 2011-04-10 ENCOUNTER — Ambulatory Visit (HOSPITAL_COMMUNITY)
Admission: RE | Admit: 2011-04-10 | Discharge: 2011-04-10 | Disposition: A | Discharge status: Home / Self Care | Payer: Medicaid Other | Source: Ambulatory Visit | Attending: Nurse Practitioner | Admitting: Nurse Practitioner

## 2011-04-10 ENCOUNTER — Other Ambulatory Visit: Payer: Self-pay | Admitting: *Deleted

## 2011-04-10 DIAGNOSIS — R1310 Dysphagia, unspecified: Secondary | ICD-10-CM | POA: Insufficient documentation

## 2011-04-10 DIAGNOSIS — K224 Dyskinesia of esophagus: Secondary | ICD-10-CM | POA: Insufficient documentation

## 2011-04-10 DIAGNOSIS — K220 Achalasia of cardia: Secondary | ICD-10-CM

## 2011-04-10 NOTE — Telephone Encounter (Signed)
Did speak to "Sheba" at [facility] and was told to fill out the referral form she faxed me and send our records on this patient by fax to [phone]. Once Dr Adria Devon reviews the records, they will call the patient, it may before early March for the appointment.

## 2011-04-15 ENCOUNTER — Telehealth: Payer: Self-pay | Admitting: *Deleted

## 2011-04-15 NOTE — Telephone Encounter (Signed)
I called the patient and asked him if anyone from [facility] called him with an appointment for the referral we did to their office with Dr. Lorelee Market.  He said they did call him and his appointment is 05-11-2011 at 12:30 PM.

## 2011-04-21 ENCOUNTER — Other Ambulatory Visit (HOSPITAL_COMMUNITY): Payer: Self-pay | Admitting: Gastroenterology

## 2011-04-21 ENCOUNTER — Other Ambulatory Visit: Payer: Self-pay

## 2011-04-22 ENCOUNTER — Telehealth: Payer: Self-pay | Admitting: Gastroenterology

## 2011-04-22 ENCOUNTER — Ambulatory Visit: Payer: Medicaid Other | Admitting: Gastroenterology

## 2011-04-22 NOTE — Telephone Encounter (Signed)
See notes on barium swallow report from 04/21/11

## 2011-04-28 ENCOUNTER — Ambulatory Visit (HOSPITAL_COMMUNITY)
Admission: RE | Admit: 2011-04-28 | Discharge: 2011-04-28 | Disposition: A | Discharge status: Home / Self Care | Payer: Medicaid Other | Source: Ambulatory Visit | Attending: Internal Medicine | Admitting: Internal Medicine

## 2011-04-28 ENCOUNTER — Ambulatory Visit (HOSPITAL_COMMUNITY)
Admission: RE | Admit: 2011-04-28 | Discharge: 2011-04-28 | Disposition: A | Discharge status: Home / Self Care | Payer: Medicaid Other | Source: Ambulatory Visit | Attending: Gastroenterology | Admitting: Gastroenterology

## 2011-04-28 DIAGNOSIS — R1310 Dysphagia, unspecified: Secondary | ICD-10-CM | POA: Insufficient documentation
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
## 2011-04-29 ENCOUNTER — Encounter: Payer: Self-pay | Admitting: Neurology

## 2011-05-08 ENCOUNTER — Other Ambulatory Visit: Payer: Medicaid Other | Admitting: Lab

## 2011-05-08 ENCOUNTER — Ambulatory Visit (HOSPITAL_BASED_OUTPATIENT_CLINIC_OR_DEPARTMENT_OTHER): Payer: Medicaid Other

## 2011-05-08 ENCOUNTER — Ambulatory Visit: Payer: Medicaid Other | Admitting: Hematology & Oncology

## 2011-05-08 ENCOUNTER — Ambulatory Visit (HOSPITAL_BASED_OUTPATIENT_CLINIC_OR_DEPARTMENT_OTHER): Payer: Medicaid Other | Admitting: Hematology & Oncology

## 2011-05-08 ENCOUNTER — Ambulatory Visit: Payer: Medicaid Other

## 2011-05-08 ENCOUNTER — Other Ambulatory Visit (HOSPITAL_BASED_OUTPATIENT_CLINIC_OR_DEPARTMENT_OTHER): Payer: Medicaid Other | Admitting: Lab

## 2011-05-08 VITALS — BP 161/82 | HR 91 | Temp 97.1°F | Ht 71.0 in | Wt 197.0 lb

## 2011-05-08 DIAGNOSIS — D631 Anemia in chronic kidney disease: Secondary | ICD-10-CM

## 2011-05-08 DIAGNOSIS — D509 Iron deficiency anemia, unspecified: Secondary | ICD-10-CM

## 2011-05-08 DIAGNOSIS — M2550 Pain in unspecified joint: Secondary | ICD-10-CM

## 2011-05-08 DIAGNOSIS — IMO0001 Reserved for inherently not codable concepts without codable children: Secondary | ICD-10-CM

## 2011-05-08 DIAGNOSIS — C9000 Multiple myeloma not having achieved remission: Secondary | ICD-10-CM

## 2011-05-08 LAB — CBC WITH DIFFERENTIAL (CANCER CENTER ONLY)
BASO%: 0.8 % (ref 0.0–2.0)
LYMPH%: 35.9 % (ref 14.0–48.0)
MCH: 25.6 pg — ABNORMAL LOW (ref 28.0–33.4)
MCV: 78 fL — ABNORMAL LOW (ref 82–98)
MONO%: 9.7 % (ref 0.0–13.0)
NEUT#: 3.7 10*3/uL (ref 1.5–6.5)
Platelets: 226 10*3/uL (ref 145–400)
RDW: 14.8 % (ref 11.1–15.7)
WBC: 7.4 10*3/uL (ref 4.0–10.0)

## 2011-05-08 MED ORDER — SODIUM CHLORIDE 0.9 % IV SOLN
90.0000 mg | Freq: Once | INTRAVENOUS | Status: AC
  Administered 2011-05-08 (×2): 90 mg via INTRAVENOUS
  Filled 2011-05-08: qty 500

## 2011-05-08 MED ORDER — SODIUM CHLORIDE 0.9 % IJ SOLN
10.0000 mL | INTRAMUSCULAR | Status: DC | PRN
  Administered 2011-05-08: 10 mL
  Filled 2011-05-08: qty 10

## 2011-05-08 MED ORDER — HEPARIN SOD (PORK) LOCK FLUSH 100 UNIT/ML IV SOLN
500.0000 [IU] | Freq: Once | INTRAVENOUS | Status: AC | PRN
  Administered 2011-05-08: 500 [IU]
  Filled 2011-05-08: qty 5

## 2011-05-08 MED ORDER — SODIUM CHLORIDE 0.9 % IV SOLN
Freq: Once | INTRAVENOUS | Status: AC
  Administered 2011-05-08: 09:00:00 via INTRAVENOUS

## 2011-05-08 NOTE — Progress Notes (Signed)
This office note has been dictated.

## 2011-05-08 NOTE — Progress Notes (Signed)
DIAGNOSES: 1. IgG kappa myeloma, clinical remission. 2. Severe fibromyalgia. 3. Anemia secondary to renal insufficiency. 4. Anemia secondary to iron deficiency.  CURRENT THERAPY: 1. Aredia 90 mg IV every 2 months. 2. IV iron as indicated. 3. Aranesp 300 mcg as needed for a hemoglobin less than 11.  INTERVAL HISTORY:  [name] comes in for followup.  He is still bothered by the fibromyalgia.  He just has longstanding chronic issues with this.  So far, his myeloma has not been a problem from my point of view.  He has had no evidence of progression with the myeloma.  His last protein electrophoresis that we did on him did not show any evidence of a monoclonal spike.  His IgG level was 2,120 mg/dL back in January.  His serum kappa light chain was 4.62 mg/dL.  We did go ahead and give him, I think, some iron the last time he was here.  He got 1.020 mg of Feraheme.  He also got Aranesp.  This helps his anemia quite a bit.  Otherwise, he, again, is really bothered by the fibromyalgia.  PHYSICAL EXAMINATION:  General Appearance:  This is a well-developed, well-nourished, black gentleman in no obvious distress.  Vital Signs: Temperature of 97.1.  Pulse 91.  Respiratory rate 20.  Blood pressure 161/82.  Weight is 197.  Head and Neck exam:  A normocephalic, atraumatic skull.  There are no ocular or oral lesions.  There are no palpable cervical or supraclavicular lymph nodes.  Lungs:  Clear bilaterally.  Cardiac Exam:  Regular rate and rhythm with no murmurs, rubs, or bruits.  Abdominal Exam:  Soft with good bowel sounds.  There is no palpable abdominal mass.  There is no fluid wave.  No palpable hepatosplenomegaly.  Back Exam:  No tenderness over the spine, ribs, or hips.  Extremities:  No clubbing, cyanosis, or edema.  He does have some tenderness over his joints and muscles.  LABORATORY STUDIES:  White cell count is 7.4, hemoglobin 11.6, hematocrit 35.2, platelet count  226.  IMPRESSION:  [name] is a 61 year old gentleman with a history of IgG kappa myeloma.  We have not had to treat this for a while.  He did receive Velcade and Cytoxan.  His last treatment was back in January 2012.  He had some issues with this.  As such, we are just following him.  I think the Aredia is helping him a little bit.  We will go ahead and do his Aredia today.  We will plan to get back in 2 more months for his next dose.    ______________________________ [name], M.D. PRE/MEDQ  D:  05/08/2011  T:  05/08/2011  Job:  [id number]

## 2011-05-08 NOTE — Patient Instructions (Signed)
[facility] Discharge Instructions for Patients Receiving Chemotherapy  Today you received the following chemotherapy agents:  Aredia.  To help prevent nausea and vomiting after your treatment, we encourage you to take your nausea medication as prescribed.  If you develop nausea and vomiting that is not controlled by your nausea medication, call the clinic. If it is after clinic hours your family physician or the after hours number for the clinic or go to the Emergency Department.   BELOW ARE SYMPTOMS THAT SHOULD BE REPORTED IMMEDIATELY:  *FEVER GREATER THAN 101.0 F  *CHILLS WITH OR WITHOUT FEVER  NAUSEA AND VOMITING THAT IS NOT CONTROLLED WITH YOUR NAUSEA MEDICATION  *UNUSUAL SHORTNESS OF BREATH  *UNUSUAL BRUISING OR BLEEDING  TENDERNESS IN MOUTH AND THROAT WITH OR WITHOUT PRESENCE OF ULCERS  *URINARY PROBLEMS  *BOWEL PROBLEMS  UNUSUAL RASH Items with * indicate a potential emergency and should be followed up as soon as possible.    I have been informed and understand all the instructions given to me. I know to contact the clinic, my physician, or go to the Emergency Department if any problems should occur. I do not have any questions at this time, but understand that I may call the clinic during office hours or the Patient Navigator at [phone] should I have any questions or need assistance in obtaining follow up care.    __________________________________________  _____________  __________ Signature of Patient or Authorized Representative            Date                   Time    __________________________________________ Nurse's Signature

## 2011-05-14 LAB — PROTEIN ELECTROPHORESIS, SERUM, WITH REFLEX
Albumin ELP: 50.5 % — ABNORMAL LOW (ref 55.8–66.1)
Alpha-2-Globulin: 13.6 % — ABNORMAL HIGH (ref 7.1–11.8)
Beta 2: 5.9 % (ref 3.2–6.5)
Beta Globulin: 5 % (ref 4.7–7.2)
Total Protein, Serum Electrophoresis: 7.9 g/dL (ref 6.0–8.3)

## 2011-05-14 LAB — COMPREHENSIVE METABOLIC PANEL
AST: 22 U/L (ref 0–37)
Alkaline Phosphatase: 82 U/L (ref 39–117)
BUN: 18 mg/dL (ref 6–23)
Glucose, Bld: 183 mg/dL — ABNORMAL HIGH (ref 70–99)
Potassium: 3.3 mEq/L — ABNORMAL LOW (ref 3.5–5.3)
Sodium: 137 mEq/L (ref 135–145)
Total Bilirubin: 0.2 mg/dL — ABNORMAL LOW (ref 0.3–1.2)
Total Protein: 7.9 g/dL (ref 6.0–8.3)

## 2011-05-14 LAB — IRON AND TIBC
Iron: 102 ug/dL (ref 42–165)
TIBC: 250 ug/dL (ref 215–435)
UIBC: 148 ug/dL (ref 125–400)

## 2011-05-14 LAB — RETICULOCYTES (CHCC): RBC.: 4.51 MIL/uL (ref 4.22–5.81)

## 2011-05-14 LAB — LACTATE DEHYDROGENASE: LDH: 184 U/L (ref 94–250)

## 2011-05-14 LAB — IGG, IGA, IGM
IgA: 436 mg/dL — ABNORMAL HIGH (ref 68–379)
IgG (Immunoglobin G), Serum: 1910 mg/dL — ABNORMAL HIGH (ref 650–1600)

## 2011-05-15 ENCOUNTER — Telehealth: Payer: Self-pay | Admitting: *Deleted

## 2011-05-15 NOTE — Telephone Encounter (Signed)
Pt called and given Dr. Antonieta Pert message.

## 2011-05-15 NOTE — Telephone Encounter (Signed)
Message copied by Orlando Penner on Fri May 15, 2011  5:35 PM ------      Message from: Volanda Napoleon      Created: Tue May 12, 2011  9:21 PM       Call - no myeloma on labs.  pete

## 2011-05-17 ENCOUNTER — Encounter (HOSPITAL_COMMUNITY): Payer: Self-pay

## 2011-05-17 ENCOUNTER — Emergency Department (HOSPITAL_COMMUNITY)
Admission: EM | Admit: 2011-05-17 | Discharge: 2011-05-17 | Disposition: A | Discharge status: Home / Self Care | Payer: Medicaid Other | Attending: Emergency Medicine | Admitting: Emergency Medicine

## 2011-05-17 ENCOUNTER — Emergency Department (HOSPITAL_COMMUNITY): Payer: Medicaid Other

## 2011-05-17 DIAGNOSIS — Z79899 Other long term (current) drug therapy: Secondary | ICD-10-CM | POA: Insufficient documentation

## 2011-05-17 DIAGNOSIS — E119 Type 2 diabetes mellitus without complications: Secondary | ICD-10-CM | POA: Insufficient documentation

## 2011-05-17 DIAGNOSIS — S6000XA Contusion of unspecified finger without damage to nail, initial encounter: Secondary | ICD-10-CM | POA: Insufficient documentation

## 2011-05-17 DIAGNOSIS — L089 Local infection of the skin and subcutaneous tissue, unspecified: Secondary | ICD-10-CM

## 2011-05-17 DIAGNOSIS — Z8739 Personal history of other diseases of the musculoskeletal system and connective tissue: Secondary | ICD-10-CM | POA: Insufficient documentation

## 2011-05-17 DIAGNOSIS — I10 Essential (primary) hypertension: Secondary | ICD-10-CM | POA: Insufficient documentation

## 2011-05-17 DIAGNOSIS — X58XXXA Exposure to other specified factors, initial encounter: Secondary | ICD-10-CM | POA: Insufficient documentation

## 2011-05-17 MED ORDER — CEPHALEXIN 500 MG PO CAPS: 500.0000 mg | ORAL_CAPSULE | Freq: Four times a day (QID) | ORAL | Status: AC

## 2011-05-17 NOTE — Discharge Instructions (Signed)
Keep you wound clean and dry. Apply bacitracin twice a day. Watch for signs of worsening infection such as increased swelling, redness, drainage. Take keflex as prescribed until all gone for the infection. Follow up with either your doctor or here in two days for recheck. Return sooner if infection is worsening.  Foreign Body A foreign body is something in your body that should not be there. This may have been caused by a puncture wound or other injury. Puncture wounds become easily infected. This happens when bacteria (germs) get under the skin. Rusty nails and similar foreign bodies are often dirty and carry germs on them.  TREATMENT   A foreign body is usually removed if this can be easily done right after it happens.   Sometimes they are left in and removed at a later surgery. They may be left in indefinitely if they will not cause later problems.   The following are general instructions in caring for your wound.  HOME CARE INSTRUCTIONS   A dressing, depending on the location of the wound, may have been applied. This may be changed once per day or as instructed. If the dressing sticks, it may be soaked off with soapy water or hydrogen peroxide.   Only take over-the-counter or prescription medicines for pain, discomfort, or fever as directed by your caregiver.   Be aware that your body will work to remove the foreign substance. That is, the foreign body may work itself out of the wound. That is normal.   You may have received a recommendation to follow up with your physician or a specialist. It is very important to call for or keep follow-up appointments in order to avoid infection or other complications.  SEEK IMMEDIATE MEDICAL CARE IF:   There is redness, swelling, or increasing pain in the wound.   You notice a foul smell coming from the wound or dressing.   Pus is coming from the wound.   An unexplained oral temperature above 102 F (38.9 C) develops, or as your caregiver  suggests.   There is increasing pain in the wound.  If you did not receive a tetanus shot today because you did not recall when your last one was given, check with your caregiver's office and determine if one is needed. Generally for a "dirty" wound, you should receive a tetanus booster if you have not had one in the last five years. If you have a "clean" wound, you should receive a tetanus booster if you have not had one within the last ten years. If you have a foreign body that needs removal and this was not done today, make sure you know how you are to follow up and what is the plan of action for taking care of this. It is your responsibility to follow up on this. MAKE SURE YOU:   Understand these instructions.   Will watch your condition.   Will get help right away if you are not doing well or get worse.  Document Released: 08/05/2000 Document Revised: 01/29/2011 Document Reviewed: 09/29/2007 [facility] Patient Information 2012 [place].

## 2011-05-17 NOTE — ED Provider Notes (Signed)
Medical screening examination/treatment/procedure(s) were performed by non-physician practitioner and as supervising physician I was immediately available for consultation/collaboration.  Leota Jacobsen, MD 05/17/11 [phone]

## 2011-05-17 NOTE — ED Notes (Signed)
Patient here with possible foreign body to right hand at base of thumb. Pain and tenderness to same

## 2011-05-17 NOTE — ED Provider Notes (Signed)
History     CSN: [id number]  Arrival date & time 05/17/11  1411   First MD Initiated Contact with Patient 05/17/11 1539      Chief Complaint  Patient presents with  . Foreign Body    (Consider location/radiation/quality/duration/timing/severity/associated sxs/prior treatment) Patient is a 61 y.o. male presenting with foreign body. The history is provided by the patient.  Foreign Body  The current episode started 2 days ago. Intake: skin. Pertinent negatives include no fever.  Pt states he noted a red spot and a draining bump to the right hand two days ago. States feels like there is something in there. Pain worsened today. Denies redness streaking up his hand, denies fever, chills, malaise. He is a cancer pt on chemotherapy, last therapy 2 days ago. Does not recall any injuries to that hand.   Past Medical History  Diagnosis Date  . Multiple myeloma   . Hypertension   . Renal insufficiency   . Diverticulosis   . Hemorrhoids   . Type 2 diabetes mellitus   . Anxiety disorder   . Urinary retention   . Anemia   . Diabetic neuropathy   . Constipation   . Herpes zoster   . Arthritis   . Hyperlipidemia   . Myeloma 12/31/2010  . Arthralgia of multiple joints 12/31/2010  . Anemia, iron deficiency 03/12/2011  . 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 Knee arthroscopy     left  . Portacath placement   . Cataract extraction     bi-lateral  . Transurethral resection of prostate 01/2011    Dr. at [facility] in [place]    Family History  Problem Relation Age of Onset  . Breast cancer Sister   . Breast cancer Mother   . Diabetes Sister   . Bone cancer Father   . Colon cancer Neg Hx   . Heart disease Sister   . Heart disease Mother   . Heart disease Daughter   . Throat cancer Brother   . Stomach cancer Brother     History  Substance Use Topics  . Smoking status: Never Smoker   . Smokeless tobacco: Never Used  . Alcohol  Use: No      Review of Systems  Constitutional: Negative for fever and chills.  HENT: Negative.   Eyes: Negative.   Respiratory: Negative.   Cardiovascular: Negative.   Gastrointestinal: Negative.   Genitourinary: Negative.   Musculoskeletal: Positive for myalgias and arthralgias.  Skin: Positive for color change and wound.       Foreign body  Neurological: Negative.   Psychiatric/Behavioral: Negative.     Allergies  Iodine  Home Medications   Current Outpatient Rx  Name Route Sig Dispense Refill  . AMLODIPINE BESYLATE 10 MG PO TABS Oral Take 10 mg by mouth daily.      Marland Kitchen BISACODYL 5 MG PO TBEC Oral Take 5 mg by mouth 2 (two) times daily. Two tablets by mouth twice a day     . DEXLANSOPRAZOLE 60 MG PO CPDR Oral Take 60 mg by mouth 2 (two) times daily.    . ERGOCALCIFEROL 50000 UNITS PO CAPS Oral Take 50,000 Units by mouth once a week.      . ESOMEPRAZOLE MAGNESIUM 40 MG PO CPDR Oral Take 1 capsule (40 mg total) by mouth daily. 30 capsule 3    Samples: Lot # [id number] Lot # 09/2013  . FUROSEMIDE 20 MG PO TABS Oral  Take 20 mg by mouth daily.      Marland Kitchen GLIPIZIDE 5 MG PO TABS Oral Take 5 mg by mouth daily.     Marland Kitchen HYOSCYAMINE SULFATE 0.125 MG PO TABS Oral Take 0.125 mg by mouth. 1-2 tablets by mouth or dissolve under tongue three times a day before meals     . METHOCARBAMOL 500 MG PO TABS Oral Take 500 mg by mouth 4 (four) times daily.      . OXYCODONE HCL ER 60 MG PO TB12 Oral Take 60 mg by mouth 3 (three) times daily.      Marland Kitchen POTASSIUM CHLORIDE CRYS ER 20 MEQ PO TBCR Oral Take 20 mEq by mouth 2 (two) times daily.    Marland Kitchen TAMSULOSIN HCL 0.4 MG PO CAPS Oral Take 0.4 mg by mouth daily.     . TRIAMTERENE-HCTZ 37.5-25 MG PO TABS Oral Take 1 tablet by mouth daily.        BP 146/74  Pulse 85  Temp(Src) 98.5 F (36.9 C) (Oral)  Resp 18  SpO2 98%  Physical Exam  Nursing note and vitals reviewed. Constitutional: He is oriented to person, place, and time. He appears well-developed and  well-nourished. No distress.  HENT:  Head: Normocephalic.  Eyes: Conjunctivae are normal.  Neck: Neck supple.  Cardiovascular: Normal rate, regular rhythm and normal heart sounds.   Pulmonary/Chest: Effort normal and breath sounds normal. No respiratory distress.  Musculoskeletal:       There is a 1cm erythemous area to the thenar eminence of right hand. There is a pustule in the middle with small amount of drainage. Tender to palpation. No streaking. Normal hand exam otherwise.  Neurological: He is alert and oriented to person, place, and time.  Skin: Skin is warm and dry.  Psychiatric: He has a normal mood and affect.    ED Course  Procedures (including critical care time)  Labs Reviewed - No data to display Dg Hand Complete Right  05/17/2011  *RADIOLOGY REPORT*  Clinical Data: Injury.  Possible foreign body in the palm of the hand near the base of the thumb.  RIGHT HAND - COMPLETE 3+ VIEW 05/17/2011:  Comparison: None.  Findings: Very small barely opaque foreign body in the soft tissues adjacent to the first MCP joint, possibly a small sliver of wood or plastic, not metal. No evidence of acute fracture or dislocation. Joint space narrowing involving the first MCP joint and the IP joint of thumb, with associated hypertrophic spurring.  Joint space narrowing and associated hypertrophic spurring involving multiple IP joints and the other fingers.  Bone mineral density well preserved.  IMPRESSION:  1.  Very small barely opaque foreign body in the soft tissues adjacent to the first MCP joint, possibly a sliver of wood or plastic; this is not metallic. 2.  Osteoarthritis.  No acute osseous abnormality.  Original Report Authenticated By: Deniece Portela, M.D.   INCISION AND DRAINAGE Performed by: Jeannett Senior A Consent: Verbal consent obtained. Risks and benefits: risks, benefits and alternatives were discussed Type: foreign body in skin with infection.  Body area: right  hand  Anesthesia: local infiltration  Local anesthetic: lidocaine 2% w/ epinephrine  Anesthetic total: 1 ml  Drainage: purulent  Drainage amount: small  Foreign body identified and removed, small about 72mm triangular sharp object Bacitracin applied with sterile bandage. Wound hemostatic, no complications.  Patient tolerance: Patient tolerated the procedure well with no immediate complications.     No diagnosis found.    MDM  Pt with superficial small foreign body in right hand that was removed with I&D. Wound irrigated. Will d/c home with antibiotics and close recheck. Pt otherwise non toxic, afebrile, infection localized.        Renold Genta, [place] 05/17/11 1652

## 2011-05-22 ENCOUNTER — Telehealth: Payer: Self-pay | Admitting: *Deleted

## 2011-05-22 NOTE — Telephone Encounter (Addendum)
Message copied by Orlando Penner on Fri May 22, 2011 10:17 AM ------      Message from: Burney Gauze R      Created: Thu May 21, 2011  9:16 PM       Call:  No obvious myeloma.  Leida Lauth this message to pt's wife.  Voiced understanding.

## 2011-06-17 ENCOUNTER — Ambulatory Visit (INDEPENDENT_AMBULATORY_CARE_PROVIDER_SITE_OTHER): Payer: Medicaid Other | Admitting: Neurology

## 2011-06-17 ENCOUNTER — Encounter: Payer: Self-pay | Admitting: Neurology

## 2011-06-17 ENCOUNTER — Other Ambulatory Visit (INDEPENDENT_AMBULATORY_CARE_PROVIDER_SITE_OTHER): Payer: Medicaid Other

## 2011-06-17 ENCOUNTER — Other Ambulatory Visit: Payer: Self-pay | Admitting: Neurology

## 2011-06-17 DIAGNOSIS — R29898 Other symptoms and signs involving the musculoskeletal system: Secondary | ICD-10-CM

## 2011-06-17 DIAGNOSIS — C9000 Multiple myeloma not having achieved remission: Secondary | ICD-10-CM

## 2011-06-17 LAB — CBC WITH DIFFERENTIAL/PLATELET
Basophils Absolute: 0 10*3/uL (ref 0.0–0.1)
Eosinophils Absolute: 0.2 10*3/uL (ref 0.0–0.7)
HCT: 35.7 % — ABNORMAL LOW (ref 39.0–52.0)
Lymphs Abs: 2 10*3/uL (ref 0.7–4.0)
MCHC: 32.9 g/dL (ref 30.0–36.0)
MCV: 78.6 fl (ref 78.0–100.0)
Monocytes Absolute: 0.2 10*3/uL (ref 0.1–1.0)
Neutrophils Relative %: 67.3 % (ref 43.0–77.0)
Platelets: 243 10*3/uL (ref 150.0–400.0)
RDW: 15.7 % — ABNORMAL HIGH (ref 11.5–14.6)
WBC: 7.5 10*3/uL (ref 4.5–10.5)

## 2011-06-17 LAB — VITAMIN B12: Vitamin B-12: 820 pg/mL (ref 211–911)

## 2011-06-17 LAB — COMPREHENSIVE METABOLIC PANEL
ALT: 18 U/L (ref 0–53)
AST: 20 U/L (ref 0–37)
Albumin: 4.5 g/dL (ref 3.5–5.2)
Alkaline Phosphatase: 63 U/L (ref 39–117)
Glucose, Bld: 152 mg/dL — ABNORMAL HIGH (ref 70–99)
Potassium: 4.3 mEq/L (ref 3.5–5.1)
Sodium: 137 mEq/L (ref 135–145)
Total Bilirubin: 0.3 mg/dL (ref 0.3–1.2)
Total Protein: 9 g/dL — ABNORMAL HIGH (ref 6.0–8.3)

## 2011-06-17 LAB — SEDIMENTATION RATE: Sed Rate: 39 mm/hr — ABNORMAL HIGH (ref 0–22)

## 2011-06-17 LAB — TSH: TSH: 0.91 u[IU]/mL (ref 0.35–5.50)

## 2011-06-17 NOTE — Patient Instructions (Signed)
Go to the basement to have your labs drawn today.  Your MRI's are scheduled for Saturday, April 27th at 12:00noon.  Please arrive to [facility] MRI by 11:45am.  [phone].   Your appointment for the nerve conduction studies/electromyelogram is scheduled for Monday, May 20th at 9:30am at [facility]. [place], [place].

## 2011-06-17 NOTE — Progress Notes (Signed)
Dear Dr. Fuller Plan,  Thank you for having me see [name] in consultation today at [facility] for his problem with swallowing.  As you may recall, he is a 61 y.o. year old male with a history of multiple myeloma, severe brain trauma who has had progressive difficulty with swallowing over the last 2-3 years.  He feels both solids and liquids are difficult to swallowing the food getting hung up in the back of his throat as well as in his chest.  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  However, he has been refractory to esophageal dilatation.  He had a subsequent MBS which revealed oral dysphagia with impairment of lingual function.  This finding prompted a neurologic referral.  He endorses no change in his dysphagia throughout the day.  He endorses no dysarthria, but does get double vision, as well as weakness in his arms and legs.  He says in particular his strength is diminished in his left arm and hand.  He says he gets "muscle twitching at times".  He denies significant neck pain but dose have all over bone pain.  He has numbness in his feet that is chronic, with some burning in the feet.    it is unclear how well controlled his diabetes is.  He also has recently developed constipation and stomach pain.   Past Medical History  Diagnosis Date  . Multiple myeloma   . Hypertension   . Renal insufficiency   . Diverticulosis   . Hemorrhoids   . Type 2 diabetes mellitus   . Anxiety disorder   . Urinary retention   . Anemia   . Diabetic neuropathy   . Constipation   . Herpes zoster   . Arthritis   . Hyperlipidemia   . Myeloma 12/31/2010  . Arthralgia of multiple joints 12/31/2010  . Anemia, iron deficiency 03/12/2011  . 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 Knee arthroscopy     left  . Portacath placement   . Cataract extraction     bi-lateral  . Transurethral resection of  prostate 01/2011    Dr. at [facility] in [place]    History   Social History  . Marital Status: Divorced    Spouse Name: N/A    Number of Children: 3  . Years of Education: N/A   Occupational History  . retired    Social History Main Topics  . Smoking status: Never Smoker   . Smokeless tobacco: Never Used  . Alcohol Use: No  . Drug Use: No  . Sexually Active: None   Other Topics Concern  . None   Social History Narrative  . None    Family History  Problem Relation Age of Onset  . Breast cancer Sister   . Breast cancer Mother   . Diabetes Sister   . Bone cancer Father   . Colon cancer Neg Hx   . Heart disease Sister   . Heart disease Mother   . Heart disease Daughter   . Throat cancer Brother   . Stomach cancer Brother   - no history of neurologic diseases  Current Outpatient Prescriptions on File Prior to Visit  Medication Sig Dispense Refill  . amLODipine (NORVASC) 10 MG tablet Take 10 mg by mouth daily.        . bisacodyl (DULCOLAX) 5 MG EC tablet Take 5 mg by mouth 2 (  two) times daily. Two tablets by mouth twice a day       . dexlansoprazole (DEXILANT) 60 MG capsule Take 60 mg by mouth 2 (two) times daily.      . ergocalciferol (VITAMIN D2) 50000 UNITS capsule Take 50,000 Units by mouth once a week.        . furosemide (LASIX) 20 MG tablet Take 20 mg by mouth daily.        Marland Kitchen glipiZIDE (GLUCOTROL) 5 MG tablet Take 5 mg by mouth daily.       . hydrALAZINE (APRESOLINE) 25 MG tablet Take 25 mg by mouth 3 (three) times daily.      . hyoscyamine (LEVSIN, ANASPAZ) 0.125 MG tablet Take 0.125 mg by mouth. 1-2 tablets by mouth or dissolve under tongue three times a day before meals       . methocarbamol (ROBAXIN) 500 MG tablet Take 500 mg by mouth 4 (four) times daily.        . Oxycodone HCl (OXYCONTIN) 60 MG TB12 Take 60 mg by mouth 3 (three) times daily.        . potassium chloride SA (K-DUR,KLOR-CON) 20 MEQ tablet Take 20 mEq by mouth 2 (two) times daily.       . sucralfate (CARAFATE) 1 GM/10ML suspension Take 1 g by mouth 4 (four) times daily.      . Tamsulosin HCl (FLOMAX) 0.4 MG CAPS Take 0.4 mg by mouth daily.       Marland Kitchen triamterene-hydrochlorothiazide (MAXZIDE-25) 37.5-25 MG per tablet Take 1 tablet by mouth daily.        Marland Kitchen DISCONTD: omeprazole (PRILOSEC) 40 MG capsule Take 1 capsule (40 mg total) by mouth daily.  30 capsule  11  . DISCONTD: potassium chloride (KLOR-CON) 20 MEQ packet Take 20 mEq by mouth daily.         Allergies  Allergen Reactions  . Iodine Rash      ROS:  13 systems were reviewed and are notable for difficulty with constipation and abdominal pain..  All other review of systems are unremarkable.   Examination:  Filed Vitals:   06/17/11 0815  BP: 124/72  Pulse: 84  Weight: 188 lb (85.276 kg)     In general, well nourished appearing man in NAD.  Cardiovascular: The patient has a regular rate and rhythm and no carotid bruits.  Fundoscopy:  Disks are flat. Vessel caliber within normal limits.  Mental status:   The patient is oriented to person, place and time. Recent and remote memory are intact. Attention span and concentration are normal. Language including repetition, naming, following commands are intact. Fund of knowledge of current and historical events, as well as vocabulary are normal.  Cranial Nerves: Pupils are equally round and reactive to light. Visual fields full to confrontation.EOMS appear to have full versions.  Xcover test does not bring out a deviation.  However, he does have subjective diplopia on lateral gaze. Facial sensation and muscles of mastication are intact. Synkinesis of the left lower face, right lower facial droop.  Tongue deviation to the right.  Mild neck extension neck flexion weakness.  Marland Kitchen Hearing intact to bilateral finger rub.Shoulder shrug intact  Motor:  The patient has wasting of his thenar and hypothenar eminence of his left hand..  There are no adventitious  movements.  Muscle Exam   SA ElFlex ElExt WrFlex WrExt FingAb Hip Flex Hip Ext KneeExt Knee Flex FootDF FootPF  Right  5 5 4+ 5 4+ 4+ 4 [place] 4 4  4 [place]  Left 5 5 4 5 4 4 5  [place] 4 4 4  [place]    Reflexes:   Biceps  Triceps Brachioradialis Knee Ankle  Right 1+  1+  1+   1+ 0  Left  1+  1+  1+   3+ 0  Toes down  Coordination:  Normal finger to nose.  No dysdiadokinesia.  Sensation is decreased in legs in a length dep manner to temperature, absent vibration in feet, position impaired.  Gait and Station are normal. Romberg is negative  Impression/Recs: 1.  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  I am going to get an EMG/NCS.  I am interested in signs of denervation in his paraspinal muscles as well as in his bulbar musculature, and whether he has signs of radiculopathies in his left upper and lower extremities.  I am also going to get an MRI of his brain to look for brain stem abnormalities(a slow growing brainstem glioma could cause these symptoms), as well as a C-spine MRI given his wasting of his thenar and hypothenar eminence.    I have also sent off AchR binding and modulating abs. 2.  ?diabetic peripheral neuropathy - He complains of burning pain from this.  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   We will see the patient back in 1 months.  Thank you for having [place] see [name] in consultation.  Feel free to contact me with any questions.  Kavin Leech Jacelyn Grip, MD [facility], [place] [address], [place] [address] Phone: [phone] Fax: [phone].

## 2011-06-18 LAB — C-REACTIVE PROTEIN: CRP: 0.61 mg/dL — ABNORMAL HIGH (ref <0.60)

## 2011-06-19 LAB — SPEP & IFE WITH QIG
Albumin ELP: 51.4 % — ABNORMAL LOW (ref 55.8–66.1)
Beta 2: 6.1 % (ref 3.2–6.5)
IgA: 472 mg/dL — ABNORMAL HIGH (ref 68–379)
IgG (Immunoglobin G), Serum: 2300 mg/dL — ABNORMAL HIGH (ref 650–1600)
IgM, Serum: 87 mg/dL (ref 41–251)
Total Protein, Serum Electrophoresis: 8.5 g/dL — ABNORMAL HIGH (ref 6.0–8.3)

## 2011-06-20 ENCOUNTER — Inpatient Hospital Stay (HOSPITAL_COMMUNITY): Admission: RE | Admit: 2011-06-20 | Payer: Medicaid Other | Source: Ambulatory Visit

## 2011-06-20 ENCOUNTER — Ambulatory Visit (HOSPITAL_COMMUNITY)
Admission: RE | Admit: 2011-06-20 | Discharge: 2011-06-20 | Disposition: A | Discharge status: Home / Self Care | Payer: Medicaid Other | Source: Ambulatory Visit | Attending: Neurology | Admitting: Neurology

## 2011-06-20 DIAGNOSIS — C9000 Multiple myeloma not having achieved remission: Secondary | ICD-10-CM | POA: Insufficient documentation

## 2011-06-20 DIAGNOSIS — R29898 Other symptoms and signs involving the musculoskeletal system: Secondary | ICD-10-CM | POA: Insufficient documentation

## 2011-06-20 DIAGNOSIS — R5381 Other malaise: Secondary | ICD-10-CM | POA: Insufficient documentation

## 2011-06-20 DIAGNOSIS — R5383 Other fatigue: Secondary | ICD-10-CM | POA: Insufficient documentation

## 2011-06-20 DIAGNOSIS — G319 Degenerative disease of nervous system, unspecified: Secondary | ICD-10-CM | POA: Insufficient documentation

## 2011-06-20 MED ORDER — GADOBENATE DIMEGLUMINE 529 MG/ML IV SOLN
17.0000 mL | Freq: Once | INTRAVENOUS | Status: AC | PRN
  Administered 2011-06-20: 17 mL via INTRAVENOUS

## 2011-06-29 ENCOUNTER — Telehealth: Payer: Self-pay | Admitting: Neurology

## 2011-06-29 NOTE — Telephone Encounter (Signed)
Called and spoke with the patient. Information given as per Dr. Jacelyn Grip re: MRI results. No questions or comments voiced at this time.

## 2011-06-29 NOTE — Telephone Encounter (Signed)
Message copied by Angelica Pou on Mon Jun 29, 2011  9:12 AM ------      Message from: Clearnce Sorrel      Created: Sat Jun 27, 2011  9:52 PM       Let Mr. Derosier know that there was no obvious cause of his difficulty swallowing on his MRI and overall it looked quite good.

## 2011-06-30 ENCOUNTER — Encounter (INDEPENDENT_AMBULATORY_CARE_PROVIDER_SITE_OTHER): Payer: Medicaid Other | Admitting: Ophthalmology

## 2011-07-07 ENCOUNTER — Encounter (INDEPENDENT_AMBULATORY_CARE_PROVIDER_SITE_OTHER): Payer: Medicaid Other | Admitting: Ophthalmology

## 2011-07-07 DIAGNOSIS — H26499 Other secondary cataract, unspecified eye: Secondary | ICD-10-CM

## 2011-07-07 DIAGNOSIS — E1139 Type 2 diabetes mellitus with other diabetic ophthalmic complication: Secondary | ICD-10-CM

## 2011-07-07 DIAGNOSIS — H35039 Hypertensive retinopathy, unspecified eye: Secondary | ICD-10-CM

## 2011-07-07 DIAGNOSIS — E11319 Type 2 diabetes mellitus with unspecified diabetic retinopathy without macular edema: Secondary | ICD-10-CM

## 2011-07-07 DIAGNOSIS — H2700 Aphakia, unspecified eye: Secondary | ICD-10-CM

## 2011-07-07 DIAGNOSIS — H43819 Vitreous degeneration, unspecified eye: Secondary | ICD-10-CM

## 2011-07-07 DIAGNOSIS — I10 Essential (primary) hypertension: Secondary | ICD-10-CM

## 2011-07-10 ENCOUNTER — Ambulatory Visit (HOSPITAL_BASED_OUTPATIENT_CLINIC_OR_DEPARTMENT_OTHER): Payer: Medicaid Other

## 2011-07-10 ENCOUNTER — Other Ambulatory Visit (HOSPITAL_BASED_OUTPATIENT_CLINIC_OR_DEPARTMENT_OTHER): Payer: Medicaid Other | Admitting: Lab

## 2011-07-10 ENCOUNTER — Ambulatory Visit (HOSPITAL_BASED_OUTPATIENT_CLINIC_OR_DEPARTMENT_OTHER): Payer: Medicaid Other | Admitting: Hematology & Oncology

## 2011-07-10 VITALS — BP 128/80 | HR 86 | Temp 98.0°F | Ht 72.0 in | Wt 190.0 lb

## 2011-07-10 DIAGNOSIS — IMO0001 Reserved for inherently not codable concepts without codable children: Secondary | ICD-10-CM

## 2011-07-10 DIAGNOSIS — M2550 Pain in unspecified joint: Secondary | ICD-10-CM

## 2011-07-10 DIAGNOSIS — C9000 Multiple myeloma not having achieved remission: Secondary | ICD-10-CM

## 2011-07-10 DIAGNOSIS — Z5111 Encounter for antineoplastic chemotherapy: Secondary | ICD-10-CM

## 2011-07-10 DIAGNOSIS — D649 Anemia, unspecified: Secondary | ICD-10-CM

## 2011-07-10 DIAGNOSIS — D509 Iron deficiency anemia, unspecified: Secondary | ICD-10-CM

## 2011-07-10 DIAGNOSIS — M797 Fibromyalgia: Secondary | ICD-10-CM

## 2011-07-10 DIAGNOSIS — D631 Anemia in chronic kidney disease: Secondary | ICD-10-CM

## 2011-07-10 DIAGNOSIS — N289 Disorder of kidney and ureter, unspecified: Secondary | ICD-10-CM

## 2011-07-10 LAB — CBC WITH DIFFERENTIAL (CANCER CENTER ONLY)
BASO#: 0.1 10*3/uL (ref 0.0–0.2)
BASO%: 0.8 % (ref 0.0–2.0)
EOS%: 3.6 % (ref 0.0–7.0)
HGB: 10.7 g/dL — ABNORMAL LOW (ref 13.0–17.1)
LYMPH#: 2.4 10*3/uL (ref 0.9–3.3)
MCHC: 33.4 g/dL (ref 32.0–35.9)
MONO%: 11 % (ref 0.0–13.0)
NEUT#: 3.7 10*3/uL (ref 1.5–6.5)
Platelets: 250 10*3/uL (ref 145–400)
RDW: 15.7 % (ref 11.1–15.7)

## 2011-07-10 MED ORDER — SODIUM CHLORIDE 0.9 % IV SOLN
90.0000 mg | Freq: Once | INTRAVENOUS | Status: AC
  Administered 2011-07-10: 90 mg via INTRAVENOUS
  Filled 2011-07-10: qty 500

## 2011-07-10 MED ORDER — HYDROMORPHONE HCL PF 4 MG/ML IJ SOLN
4.0000 mg | Freq: Once | INTRAMUSCULAR | Status: AC
  Administered 2011-07-10: 4 mg via INTRAVENOUS

## 2011-07-10 MED ORDER — SODIUM CHLORIDE 0.9 % IV SOLN
Freq: Once | INTRAVENOUS | Status: AC
  Administered 2011-07-10: 09:00:00 via INTRAVENOUS

## 2011-07-10 MED ORDER — SODIUM CHLORIDE 0.9 % IJ SOLN
10.0000 mL | INTRAMUSCULAR | Status: DC | PRN
  Administered 2011-07-10: 10 mL
  Filled 2011-07-10: qty 10

## 2011-07-10 MED ORDER — HEPARIN SOD (PORK) LOCK FLUSH 100 UNIT/ML IV SOLN
500.0000 [IU] | Freq: Once | INTRAVENOUS | Status: AC | PRN
  Administered 2011-07-10: 500 [IU]
  Filled 2011-07-10: qty 5

## 2011-07-10 MED ORDER — DARBEPOETIN ALFA-POLYSORBATE 300 MCG/0.6ML IJ SOLN
300.0000 ug | Freq: Once | INTRAMUSCULAR | Status: AC
  Administered 2011-07-10: 300 ug via SUBCUTANEOUS

## 2011-07-10 NOTE — Progress Notes (Signed)
This office note has been dictated.

## 2011-07-10 NOTE — Patient Instructions (Signed)
Darbepoetin Alfa injection What is this medicine? DARBEPOETIN ALFA (dar be POE e tin AL fa) helps your body make more red blood cells. It is used to treat anemia caused by chronic kidney failure and chemotherapy. This medicine may be used for other purposes; ask your health care provider or pharmacist if you have questions. What should I tell my health care provider before I take this medicine? They need to know if you have any of these conditions: -blood clotting disorders or history of blood clots -cancer patient not on chemotherapy -cystic fibrosis -heart disease, such as angina, heart failure, or a history of a heart attack -hemoglobin level of 12 g/dL or greater -high blood pressure -low levels of folate, iron, or vitamin B12 -seizures -an unusual or allergic reaction to darbepoetin, erythropoietin, albumin, hamster proteins, latex, other medicines, foods, dyes, or preservatives -pregnant or trying to get pregnant -breast-feeding How should I use this medicine? This medicine is for injection into a vein or under the skin. It is usually given by a health care professional in a hospital or clinic setting. If you get this medicine at home, you will be taught how to prepare and give this medicine. Do not shake the solution before you withdraw a dose. Use exactly as directed. Take your medicine at regular intervals. Do not take your medicine more often than directed. It is important that you put your used needles and syringes in a special sharps container. Do not put them in a trash can. If you do not have a sharps container, call your pharmacist or healthcare provider to get one. Talk to your pediatrician regarding the use of this medicine in children. While this medicine may be used in children as young as 1 year for selected conditions, precautions do apply. Overdosage: If you think you have taken too much of this medicine contact a poison control center or emergency room at once. NOTE:  This medicine is only for you. Do not share this medicine with others. What if I miss a dose? If you miss a dose, take it as soon as you can. If it is almost time for your next dose, take only that dose. Do not take double or extra doses. What may interact with this medicine? Do not take this medicine with any of the following medications: -epoetin alfa This list may not describe all possible interactions. Give your health care provider a list of all the medicines, herbs, non-prescription drugs, or dietary supplements you use. Also tell them if you smoke, drink alcohol, or use illegal drugs. Some items may interact with your medicine. What should I watch for while using this medicine? Visit your prescriber or health care professional for regular checks on your progress and for the needed blood tests and blood pressure measurements. It is especially important for the doctor to make sure your hemoglobin level is in the desired range, to limit the risk of potential side effects and to give you the best benefit. Keep all appointments for any recommended tests. Check your blood pressure as directed. Ask your doctor what your blood pressure should be and when you should contact him or her. As your body makes more red blood cells, you may need to take iron, folic acid, or vitamin B supplements. Ask your doctor or health care provider which products are right for you. If you have kidney disease continue dietary restrictions, even though this medication can make you feel better. Talk with your doctor or health care professional about the   foods you eat and the vitamins that you take. What side effects may I notice from receiving this medicine? Side effects that you should report to your doctor or health care professional as soon as possible: -allergic reactions like skin rash, itching or hives, swelling of the face, lips, or tongue -breathing problems -changes in vision -chest pain -confusion, trouble speaking  or understanding -feeling faint or lightheaded, falls -high blood pressure -muscle aches or pains -pain, swelling, warmth in the leg -rapid weight gain -severe headaches -sudden numbness or weakness of the face, arm or leg -trouble walking, dizziness, loss of balance or coordination -seizures (convulsions) -swelling of the ankles, feet, hands -unusually weak or tired Side effects that usually do not require medical attention (report to your doctor or health care professional if they continue or are bothersome): -diarrhea -fever, chills (flu-like symptoms) -headaches -nausea, vomiting -redness, stinging, or swelling at site where injected This list may not describe all possible side effects. Call your doctor for medical advice about side effects. You may report side effects to FDA at 1-800-FDA-1088. Where should I keep my medicine? Keep out of the reach of children. Store in a refrigerator between 2 and 8 degrees C (36 and 46 degrees F). Do not freeze. Do not shake. Throw away any unused portion if using a single-dose vial. Throw away any unused medicine after the expiration date. NOTE: This sheet is a summary. It may not cover all possible information. If you have questions about this medicine, talk to your doctor, pharmacist, or health care provider.  2012, Elsevier/Gold Standard. (01/24/2008 10:23:57 AM)Pamidronate injection What is this medicine? PAMIDRONATE (pa mi DROE nate) slows calcium loss from bones. It is used to treat high calcium blood levels from cancer or Paget's disease. It is also used to treat bone pain and prevent fractures from certain cancers that have spread to the bone. This medicine may be used for other purposes; ask your health care provider or pharmacist if you have questions. What should I tell my health care provider before I take this medicine? They need to know if you have any of these conditions: -aspirin-sensitive asthma -dental disease -kidney  disease -an unusual or allergic reaction to pamidronate, other medicines, foods, dyes, or preservatives -pregnant or trying to get pregnant -breast-feeding How should I use this medicine? This medicine is for infusion into a vein. It is given by a health care professional in a hospital or clinic setting. Talk to your pediatrician regarding the use of this medicine in children. This medicine is not approved for use in children. Overdosage: If you think you have taken too much of this medicine contact a poison control center or emergency room at once. NOTE: This medicine is only for you. Do not share this medicine with others. What if I miss a dose? This does not apply. What may interact with this medicine? -certain antibiotics given by injection -medicines for inflammation or pain like ibuprofen, naproxen -some diuretics like bumetanide, furosemide -cyclosporine -parathyroid hormone -tacrolimus -teriparatide -thalidomide This list may not describe all possible interactions. Give your health care provider a list of all the medicines, herbs, non-prescription drugs, or dietary supplements you use. Also tell them if you smoke, drink alcohol, or use illegal drugs. Some items may interact with your medicine. What should I watch for while using this medicine? Visit your doctor or health care professional for regular checkups. It may be some time before you see the benefit from this medicine. Do not stop taking your medicine  unless your doctor tells you to. Your doctor may order blood tests or other tests to see how you are doing. Women should inform their doctor if they wish to become pregnant or think they might be pregnant. There is a potential for serious side effects to an unborn child. Talk to your health care professional or pharmacist for more information. You should make sure that you get enough calcium and vitamin D while you are taking this medicine. Discuss the foods you eat and the  vitamins you take with your health care professional. Some people who take this medicine have severe bone, joint, and/or muscle pain. This medicine may also increase your risk for a broken thigh bone. Tell your doctor right away if you have pain in your upper leg or groin. Tell your doctor if you have any pain that does not go away or that gets worse. What side effects may I notice from receiving this medicine? Side effects that you should report to your doctor or health care professional as soon as possible: -allergic reactions like skin rash, itching or hives, swelling of the face, lips, or tongue -black or tarry stools -changes in vision -eye inflammation, pain -high blood pressure -jaw pain, especially burning or cramping -muscle weakness -numb, tingling pain -swelling of feet or hands -trouble passing urine or change in the amount of urine -unable to move easily Side effects that usually do not require medical attention (report to your doctor or health care professional if they continue or are bothersome): -bone, joint, or muscle pain -constipation -dizzy, drowsy -fever -headache -loss of appetite -nausea, vomiting -pain at site where injected This list may not describe all possible side effects. Call your doctor for medical advice about side effects. You may report side effects to FDA at 1-800-FDA-1088. Where should I keep my medicine? This drug is given in a hospital or clinic and will not be stored at home. NOTE: This sheet is a summary. It may not cover all possible information. If you have questions about this medicine, talk to your doctor, pharmacist, or health care provider.  2012, Elsevier/Gold Standard. (08/08/2010 8:49:49 AM)

## 2011-07-10 NOTE — Progress Notes (Signed)
DIAGNOSES: 1. IgG kappa myeloma, clinical remission. 2. Fibromyalgia, severe. 3. 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 4. 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  CURRENT THERAPY: 1. Aredia 90 mg IV q.2 months. 2. Aranesp 300 mcg subcu as needed for hemoglobin less than 11. 3. IV iron as indicated.  INTERIM HISTORY:  [name] comes in for his followup.  His fibromyalgia is bothering him quite a bit today.  This does seem to be his biggest problem.  I want to go ahead and try him on some naltrexone.  This has been shown to have effect in fibromyalgia patients.  We will see about getting this at a dose of I think 4.5 mg q.h.s.  His last iron dose was back in January.  His last Aranesp dose was back in January also.  He has had no fevers, sweats or chills.  He has been out at [facility] because his dysphagia.  He says there is something wrong with his esophagus.  They are going to try him on some medication for this.  When I last saw him, there was no evidence of myeloma.  His monoclonal spike was negative.  His IgG level was 2300 mg/dL.  His serum kappa light chain was 8.5 mg/dL.  He has had no bleeding.  He has had no leg swelling.  His blood sugars have been okay.  PHYSICAL EXAMINATION:  This is a well-developed, well-nourished black gentleman in no obvious distress.  Vital signs:  Temperature 98.6, pulse 86, respiratory rate 20, blood pressure 128/80.  Weight is 190.  Head and neck:  A normocephalic, atraumatic skull.  There are no ocular or oral lesions.  There are no palpable cervical or supraclavicular lymph nodes.  Lungs:  Clear bilaterally.  Cardiac:  Regular rate and rhythm with a normal S1 and S2.  There are no murmurs, rubs or bruits. Abdomen:  Soft with good bowel sounds.  There is no palpable abdominal mass.  There is no fluid wave.  There is no palpable hepatosplenomegaly. Extremities:  No clubbing, cyanosis or edema.  Neurologic:  No  focal neurological deficits.  LABORATORY STUDIES:  White cell count is 7.2, hemoglobin 10.7, hematocrit 32, platelet count is 250.  IMPRESSION:  [name] is a 61 year old gentleman with IgG kappa myeloma.  He has not required any therapy for this for over 2 or 3 years.  He has really had no problems with respect to the myeloma.  The Aredia is helping with bony disease.  I will go ahead and give him Aranesp today.  Hopefully, this will get his hemoglobin up a little bit more.  We will go ahead and plan to get him back in 2 more months.  He seemed to do well with the 37-month follow-ups.    ______________________________ Volanda Napoleon, M.D. PRE/MEDQ  D:  07/10/2011  T:  07/10/2011  Job:  [id number]

## 2011-07-12 ENCOUNTER — Emergency Department (HOSPITAL_COMMUNITY): Payer: Medicaid Other

## 2011-07-12 ENCOUNTER — Encounter (HOSPITAL_COMMUNITY): Payer: Self-pay | Admitting: *Deleted

## 2011-07-12 ENCOUNTER — Inpatient Hospital Stay (HOSPITAL_COMMUNITY)
Admission: EM | Admit: 2011-07-12 | Discharge: 2011-07-13 | DRG: 393 | Disposition: A | Discharge status: Home / Self Care | Payer: Medicaid Other | Attending: Internal Medicine | Admitting: Internal Medicine

## 2011-07-12 DIAGNOSIS — E785 Hyperlipidemia, unspecified: Secondary | ICD-10-CM | POA: Diagnosis present

## 2011-07-12 DIAGNOSIS — C9000 Multiple myeloma not having achieved remission: Secondary | ICD-10-CM

## 2011-07-12 DIAGNOSIS — E118 Type 2 diabetes mellitus with unspecified complications: Secondary | ICD-10-CM

## 2011-07-12 DIAGNOSIS — E1142 Type 2 diabetes mellitus with diabetic polyneuropathy: Secondary | ICD-10-CM | POA: Diagnosis present

## 2011-07-12 DIAGNOSIS — I10 Essential (primary) hypertension: Secondary | ICD-10-CM

## 2011-07-12 DIAGNOSIS — K5289 Other specified noninfective gastroenteritis and colitis: Secondary | ICD-10-CM | POA: Diagnosis present

## 2011-07-12 DIAGNOSIS — IMO0001 Reserved for inherently not codable concepts without codable children: Secondary | ICD-10-CM | POA: Diagnosis present

## 2011-07-12 DIAGNOSIS — E1165 Type 2 diabetes mellitus with hyperglycemia: Secondary | ICD-10-CM

## 2011-07-12 DIAGNOSIS — K5730 Diverticulosis of large intestine without perforation or abscess without bleeding: Secondary | ICD-10-CM

## 2011-07-12 DIAGNOSIS — E1149 Type 2 diabetes mellitus with other diabetic neurological complication: Secondary | ICD-10-CM | POA: Diagnosis present

## 2011-07-12 DIAGNOSIS — R1319 Other dysphagia: Secondary | ICD-10-CM

## 2011-07-12 DIAGNOSIS — IMO0002 Reserved for concepts with insufficient information to code with codable children: Secondary | ICD-10-CM

## 2011-07-12 DIAGNOSIS — D509 Iron deficiency anemia, unspecified: Secondary | ICD-10-CM

## 2011-07-12 DIAGNOSIS — K648 Other hemorrhoids: Principal | ICD-10-CM | POA: Diagnosis present

## 2011-07-12 DIAGNOSIS — K921 Melena: Secondary | ICD-10-CM

## 2011-07-12 DIAGNOSIS — K5731 Diverticulosis of large intestine without perforation or abscess with bleeding: Secondary | ICD-10-CM | POA: Diagnosis present

## 2011-07-12 DIAGNOSIS — M2550 Pain in unspecified joint: Secondary | ICD-10-CM

## 2011-07-12 DIAGNOSIS — K219 Gastro-esophageal reflux disease without esophagitis: Secondary | ICD-10-CM

## 2011-07-12 DIAGNOSIS — K625 Hemorrhage of anus and rectum: Secondary | ICD-10-CM

## 2011-07-12 DIAGNOSIS — D631 Anemia in chronic kidney disease: Secondary | ICD-10-CM

## 2011-07-12 DIAGNOSIS — E119 Type 2 diabetes mellitus without complications: Secondary | ICD-10-CM

## 2011-07-12 DIAGNOSIS — R1032 Left lower quadrant pain: Secondary | ICD-10-CM | POA: Diagnosis present

## 2011-07-12 DIAGNOSIS — R1310 Dysphagia, unspecified: Secondary | ICD-10-CM | POA: Diagnosis present

## 2011-07-12 LAB — DIFFERENTIAL
Basophils Relative: 0 % (ref 0–1)
Eosinophils Absolute: 0.2 10*3/uL (ref 0.0–0.7)
Eosinophils Relative: 2 % (ref 0–5)
Lymphs Abs: 1.9 10*3/uL (ref 0.7–4.0)
Monocytes Absolute: 0.7 10*3/uL (ref 0.1–1.0)
Monocytes Relative: 6 % (ref 3–12)

## 2011-07-12 LAB — CBC
Hemoglobin: 10.4 g/dL — ABNORMAL LOW (ref 13.0–17.0)
MCH: 24.8 pg — ABNORMAL LOW (ref 26.0–34.0)
MCHC: 32.2 g/dL (ref 30.0–36.0)
MCV: 76.9 fL — ABNORMAL LOW (ref 78.0–100.0)
RBC: 4.2 MIL/uL — ABNORMAL LOW (ref 4.22–5.81)

## 2011-07-12 LAB — BASIC METABOLIC PANEL
BUN: 18 mg/dL (ref 6–23)
Calcium: 8.8 mg/dL (ref 8.4–10.5)
Creatinine, Ser: 2.36 mg/dL — ABNORMAL HIGH (ref 0.50–1.35)
GFR calc Af Amer: 32 mL/min — ABNORMAL LOW (ref >90)
GFR calc non Af Amer: 28 mL/min — ABNORMAL LOW (ref >90)
Glucose, Bld: 165 mg/dL — ABNORMAL HIGH (ref 70–99)

## 2011-07-12 LAB — GLUCOSE, CAPILLARY: Glucose-Capillary: 145 mg/dL — ABNORMAL HIGH (ref 70–99)

## 2011-07-12 LAB — TYPE AND SCREEN

## 2011-07-12 LAB — PROTIME-INR: INR: 1.07 (ref 0.00–1.49)

## 2011-07-12 LAB — HEMOGLOBIN: Hemoglobin: 10.4 g/dL — ABNORMAL LOW (ref 13.0–17.0)

## 2011-07-12 MED ORDER — OXYCODONE HCL 5 MG PO TABS: 10.0000 mg | ORAL_TABLET | ORAL | Status: DC | PRN

## 2011-07-12 MED ORDER — METRONIDAZOLE IN NACL 5-0.79 MG/ML-% IV SOLN
500.0000 mg | Freq: Three times a day (TID) | INTRAVENOUS | Status: DC
  Administered 2011-07-12 – 2011-07-13 (×4): 500 mg via INTRAVENOUS
  Filled 2011-07-12 (×5): qty 100

## 2011-07-12 MED ORDER — KETOROLAC TROMETHAMINE 0.5 % OP SOLN
1.0000 [drp] | Freq: Three times a day (TID) | OPHTHALMIC | Status: DC
  Administered 2011-07-12 – 2011-07-13 (×4): 1 [drp] via OPHTHALMIC
  Filled 2011-07-12: qty 3

## 2011-07-12 MED ORDER — HYDROMORPHONE HCL PF 1 MG/ML IJ SOLN
1.0000 mg | INTRAMUSCULAR | Status: AC
  Administered 2011-07-12: 1 mg via INTRAVENOUS
  Filled 2011-07-12: qty 1

## 2011-07-12 MED ORDER — SODIUM CHLORIDE 0.9 % IV SOLN
Freq: Once | INTRAVENOUS | Status: AC
  Administered 2011-07-12: 07:00:00 via INTRAVENOUS

## 2011-07-12 MED ORDER — INSULIN ASPART 100 UNIT/ML ~~LOC~~ SOLN: 0.0000 [IU] | Freq: Every day | SUBCUTANEOUS | Status: DC

## 2011-07-12 MED ORDER — KETOROLAC TROMETHAMINE 0.4 % OP SOLN: 1.0000 [drp] | Freq: Three times a day (TID) | OPHTHALMIC | Status: DC

## 2011-07-12 MED ORDER — POTASSIUM CHLORIDE 20 MEQ/15ML (10%) PO LIQD
40.0000 meq | Freq: Once | ORAL | Status: AC
  Administered 2011-07-12: 40 meq via ORAL
  Filled 2011-07-12: qty 30

## 2011-07-12 MED ORDER — TRIAMTERENE-HCTZ 37.5-25 MG PO TABS
1.0000 | ORAL_TABLET | Freq: Every day | ORAL | Status: DC
  Administered 2011-07-12 – 2011-07-13 (×2): 1 via ORAL
  Filled 2011-07-12 (×2): qty 1

## 2011-07-12 MED ORDER — METOCLOPRAMIDE HCL 5 MG/ML IJ SOLN
10.0000 mg | Freq: Once | INTRAMUSCULAR | Status: AC
  Administered 2011-07-12: 10 mg via INTRAVENOUS
  Filled 2011-07-12: qty 2

## 2011-07-12 MED ORDER — POTASSIUM CHLORIDE CRYS ER 20 MEQ PO TBCR
20.0000 meq | EXTENDED_RELEASE_TABLET | Freq: Two times a day (BID) | ORAL | Status: DC
  Administered 2011-07-12 – 2011-07-13 (×2): 20 meq via ORAL
  Filled 2011-07-12 (×4): qty 1

## 2011-07-12 MED ORDER — INSULIN ASPART 100 UNIT/ML ~~LOC~~ SOLN: 0.0000 [IU] | Freq: Three times a day (TID) | SUBCUTANEOUS | Status: DC

## 2011-07-12 MED ORDER — POTASSIUM CHLORIDE CRYS ER 20 MEQ PO TBCR
40.0000 meq | EXTENDED_RELEASE_TABLET | ORAL | Status: AC
  Administered 2011-07-12 (×2): 40 meq via ORAL
  Filled 2011-07-12 (×2): qty 2

## 2011-07-12 MED ORDER — CIPROFLOXACIN IN D5W 400 MG/200ML IV SOLN
400.0000 mg | Freq: Once | INTRAVENOUS | Status: AC
  Administered 2011-07-12: 400 mg via INTRAVENOUS
  Filled 2011-07-12: qty 200

## 2011-07-12 MED ORDER — AMLODIPINE BESYLATE 10 MG PO TABS
10.0000 mg | ORAL_TABLET | Freq: Every day | ORAL | Status: DC
  Administered 2011-07-12 – 2011-07-13 (×2): 10 mg via ORAL
  Filled 2011-07-12 (×2): qty 1

## 2011-07-12 MED ORDER — TAMSULOSIN HCL 0.4 MG PO CAPS
0.4000 mg | ORAL_CAPSULE | Freq: Every day | ORAL | Status: DC
  Administered 2011-07-12 – 2011-07-13 (×2): 0.4 mg via ORAL
  Filled 2011-07-12 (×2): qty 1

## 2011-07-12 MED ORDER — METRONIDAZOLE IN NACL 5-0.79 MG/ML-% IV SOLN
500.0000 mg | Freq: Once | INTRAVENOUS | Status: AC
  Administered 2011-07-12: 500 mg via INTRAVENOUS
  Filled 2011-07-12: qty 100

## 2011-07-12 MED ORDER — HYDROMORPHONE HCL PF 2 MG/ML IJ SOLN
2.0000 mg | Freq: Once | INTRAMUSCULAR | Status: AC
  Administered 2011-07-12: 2 mg via INTRAVENOUS
  Filled 2011-07-12: qty 1

## 2011-07-12 MED ORDER — PANTOPRAZOLE SODIUM 40 MG PO TBEC
40.0000 mg | DELAYED_RELEASE_TABLET | Freq: Every day | ORAL | Status: DC
  Filled 2011-07-12 (×2): qty 1

## 2011-07-12 MED ORDER — CIPROFLOXACIN IN D5W 400 MG/200ML IV SOLN
400.0000 mg | Freq: Two times a day (BID) | INTRAVENOUS | Status: DC
  Administered 2011-07-12 – 2011-07-13 (×2): 400 mg via INTRAVENOUS
  Filled 2011-07-12 (×3): qty 200

## 2011-07-12 MED ORDER — OXYCODONE HCL 40 MG PO TB12
60.0000 mg | ORAL_TABLET | Freq: Two times a day (BID) | ORAL | Status: DC
  Administered 2011-07-12 – 2011-07-13 (×2): 60 mg via ORAL
  Filled 2011-07-12 (×2): qty 1

## 2011-07-12 MED ORDER — PEG-KCL-NACL-NASULF-NA ASC-C 100 G PO SOLR
1.0000 | Freq: Once | ORAL | Status: AC
  Administered 2011-07-12: 100 g via ORAL
  Filled 2011-07-12: qty 1

## 2011-07-12 MED ORDER — POTASSIUM CHLORIDE IN NACL 40-0.9 MEQ/L-% IV SOLN
INTRAVENOUS | Status: DC
  Administered 2011-07-12 – 2011-07-13 (×2): via INTRAVENOUS
  Filled 2011-07-12 (×2): qty 1000

## 2011-07-12 MED ORDER — OXYCODONE HCL 60 MG PO TB12: 1.0000 | ORAL_TABLET | Freq: Every day | ORAL | Status: DC

## 2011-07-12 MED ORDER — HYDROMORPHONE HCL PF 1 MG/ML IJ SOLN
1.0000 mg | Freq: Once | INTRAMUSCULAR | Status: AC
  Administered 2011-07-12: 1 mg via INTRAVENOUS
  Filled 2011-07-12: qty 1

## 2011-07-12 MED ORDER — MORPHINE SULFATE 4 MG/ML IJ SOLN
6.0000 mg | Freq: Once | INTRAMUSCULAR | Status: AC
  Administered 2011-07-12: 6 mg via INTRAVENOUS
  Filled 2011-07-12: qty 2

## 2011-07-12 MED ORDER — MORPHINE SULFATE 4 MG/ML IJ SOLN
4.0000 mg | INTRAMUSCULAR | Status: DC | PRN
  Administered 2011-07-12 – 2011-07-13 (×4): 4 mg via INTRAVENOUS
  Filled 2011-07-12 (×4): qty 1

## 2011-07-12 MED ORDER — HYDRALAZINE HCL 25 MG PO TABS
25.0000 mg | ORAL_TABLET | Freq: Three times a day (TID) | ORAL | Status: DC
  Administered 2011-07-12 – 2011-07-13 (×5): 25 mg via ORAL
  Filled 2011-07-12 (×6): qty 1

## 2011-07-12 MED ORDER — ONDANSETRON HCL 4 MG/2ML IJ SOLN
4.0000 mg | Freq: Once | INTRAMUSCULAR | Status: AC
  Administered 2011-07-12: 4 mg via INTRAVENOUS
  Filled 2011-07-12: qty 2

## 2011-07-12 NOTE — H&P (Signed)
Hospital Admission Note Date: 07/12/2011  Patient name: [name] Medical record number: [id number] Date of birth: 07-06-1950 Age: 61 y.o. Gender: male PCP: Barbette Merino, MD, MD  Attending physician: Leana Gamer, MD  Chief Complaint: Bright red blood per rectum  History of Present Illness: The patient is a 61 year old gentleman with a history of multiple myeloma, fibromyalgia, hemorrhoids and diverticulosis who went to the bathroom this morning and noted that he had bright red blood per rectum. The patient states that he was in bed and felt as though he urinated on himself. He went to the bathroom he had a gush of bright red blood per rectum. He states that he did not respond to this however he went back to the bathroom had the same thing occurred thus came to the hospital for further  evaluation and management. The patient states that he is under the care of his oncologist and receives Aredia IV every 2 months. He was seen in his oncologist office yesterday and started on Valtrex only she took last night for treatment of his fibromyalgia. He denies any fevers,sweats or chills. He denies any nausea vomiting or diarrhea. The patient does have significant abdominal pain most prominently in the left lower quadrant. He states the pain is excruciating however he is unable to really qualify or quantify the pain. He is also unable to give any provocative or palliative features associated with the pain.  Scheduled Meds:   . sodium chloride   Intravenous Once  . ciprofloxacin  400 mg Intravenous Once  .  HYDROmorphone (DILAUDID) injection  1 mg Intravenous Once  .  HYDROmorphone (DILAUDID) injection  1 mg Intravenous Once  .  HYDROmorphone (DILAUDID) injection  1 mg Intravenous NOW  .  HYDROmorphone (DILAUDID) injection  2 mg Intravenous Once  . metronidazole  500 mg Intravenous Once  .  morphine injection  6 mg Intravenous Once  . ondansetron (ZOFRAN) IV  4 mg Intravenous Once  . potassium  chloride  40 mEq Oral Once  . potassium chloride  40 mEq Oral Q2H   Continuous Infusions:  PRN Meds:. Allergies: Iodine Past Medical History  Diagnosis Date  . Multiple myeloma   . Hypertension   . Renal insufficiency   . Diverticulosis   . Hemorrhoids   . Type 2 diabetes mellitus   . Anxiety disorder   . Urinary retention   . Anemia   . Diabetic neuropathy   . Constipation   . Herpes zoster   . Arthritis   . Hyperlipidemia   . Myeloma 12/31/2010  . Arthralgia of multiple joints 12/31/2010  . Anemia, iron deficiency 03/12/2011  . 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 Knee arthroscopy     left  . Portacath placement   . Cataract extraction     bi-lateral  . Transurethral resection of prostate 01/2011    Dr. at [facility] in [place]   Family History  Problem Relation Age of Onset  . Breast cancer Sister   . Breast cancer Mother   . Diabetes Sister   . Bone cancer Father   . Colon cancer Neg Hx   . Heart disease Sister   . Heart disease Mother   . Heart disease Daughter   . Throat cancer Brother   . Stomach cancer Brother    History   Social History  . Marital Status: Divorced    Spouse Name: N/A    Number of Children:  3  . Years of Education: N/A   Occupational History  . retired    Social History Main Topics  . Smoking status: Never Smoker   . Smokeless tobacco: Never Used  . Alcohol Use: No  . Drug Use: No  . Sexually Active: Not on file   Other Topics Concern  . Not on file   Social History Narrative  . No narrative on file   Review of Systems: A comprehensive review of systems was negative. Physical Exam: No intake or output data in the 24 hours ending 07/12/11 0912 General: Alert, awake, oriented x3, in no acute distress.  [place]ENT: [place]AT [place], EOMI Neck: Trachea midline,  no masses, no thyromegal,y no JVD, no carotid bruit OROPHARYNX:  Moist, No exudate/ erythema/lesions.  Heart: Regular  rate and rhythm, without murmurs, rubs, gallops, PMI non-displaced, no heaves or thrills on palpation.  Lungs: Clear to auscultation, no wheezing or rhonchi noted. No increased vocal fremitus resonant to percussion  Abdomen: Soft, nontender, nondistended, positive bowel sounds, no masses no hepatosplenomegaly noted..  Neuro: No focal neurological deficits noted cranial nerves II through XII grossly intact. DTRs 2+ bilaterally upper and lower extremities. Strength 5 out of 5 in bilateral upper and lower extremities. Musculoskeletal: No warm swelling or erythema around joints, no spinal tenderness noted. Psychiatric: Patient alert and oriented x3, good insight and cognition, good recent to remote recall. Lymph node survey: No cervical axillary or inguinal lymphadenopathy noted.  Lab results:  [facility] 07/12/11 0358  NA 136  K 3.1*  CL 94*  CO2 30  GLUCOSE 165*  BUN 18  CREATININE 2.36*  CALCIUM 8.8  MG --  PHOS --   No results found for this basename: AST:2,ALT:2,ALKPHOS:2,BILITOT:2,PROT:2,ALBUMIN:2 in the last 72 hours No results found for this basename: LIPASE:2,AMYLASE:2 in the last 72 hours  Basename 07/12/11 0358 07/10/11 0743  WBC 11.5* 7.2  NEUTROABS 8.7* 3.7  HGB 10.4* 10.7*  HCT 32.3* 32.0*  MCV 76.9* 76*  PLT 253 250   No results found for this basename: CKTOTAL:3,CKMB:3,CKMBINDEX:3,TROPONINI:3 in the last 72 hours No components found with this basename: POCBNP:3 No results found for this basename: DDIMER:2 in the last 72 hours No results found for this basename: HGBA1C:2 in the last 72 hours No results found for this basename: CHOL:2,HDL:2,LDLCALC:2,TRIG:2,CHOLHDL:2,LDLDIRECT:2 in the last 72 hours No results found for this basename: TSH,T4TOTAL,FREET3,T3FREE,THYROIDAB in the last 72 hours  Basename 07/10/11 0743  VITAMINB12 --  FOLATE --  FERRITIN 1150*  TIBC 204*  IRON 82  RETICCTPCT --   Imaging results:  Ct Abdomen Pelvis Wo Contrast  07/12/2011   *RADIOLOGY REPORT*  Clinical Data: Rectal bleeding, left lower quadrant pain.  CT ABDOMEN AND PELVIS WITHOUT CONTRAST  Technique:  Multidetector CT imaging of the abdomen and pelvis was performed following the standard protocol without intravenous contrast.  Comparison: 09/18/2010  Findings: Mild bibasilar scarring versus atelectasis.  Heart size upper normal limits.  Trace pericardial fluid versus thickening.  Intra-abdominal organ evaluation is degraded given the absence of intravenous contrast.  Within this limitation, unremarkable liver, spleen, pancreas, biliary system, adrenal glands.  Bilateral perinephric fat stranding is nonspecific.  No hydronephrosis or hydroureter.  There is fluid distension of the colon and colonic diverticulosis without overt evidence for diverticulitis.  Normal appendix. Heterogeneous attenuation of within the stomach likely corresponds to ingested contrast and food.  No overt evidence for bowel obstruction.  There are loops of proximal small bowel/jejunum that are mildly thickened, nonspecific given the absence of intravenous  contrast or ingested contrast within these loops.  No free intraperitoneal air or fluid.  No lymphadenopathy.  Normal caliber vasculature.  Thin-walled bladder.  Multilevel degenerative changes of the imaged spine. No acute or aggressive appearing osseous lesion.  Bilateral SI joint ankylosis.  IMPRESSION: Bowel evaluation is limited given the absence of intravenous contrast and ingested contrast having only reached the stomach and proximal small bowel.  There are some mildly thickened loops of jejunum for which enteritis is not excluded (ischemic, infectious, and inflammatory considerations).  Fluid distended colon.  Colonic diverticulosis without overt evidence for diverticulitis.  Original Report Authenticated By: Suanne Marker, M.D.   Mr Jeri Cos [id number] Contrast  06/20/2011  *RADIOLOGY REPORT*  Clinical Data: Generalized weakness.  History of myeloma.   MRI HEAD WITHOUT AND WITH CONTRAST  Technique:  Multiplanar, multiecho pulse sequences of the brain and surrounding structures were obtained according to standard protocol without and with intravenous contrast  Contrast: 44mL MULTIHANCE GADOBENATE DIMEGLUMINE 529 MG/ML IV SOLN  Comparison: 03/26/2010 and multiple previous  Findings: Diffusion imaging does not show any acute or subacute infarction.  The brainstem and cerebellum are normal.  The cerebral hemispheres show a few scattered punctate foci of T2 and FLAIR signal in the white matter, not likely significant.  No cortical or large vessel territory infarction.  No mass lesion, hemorrhage, hydrocephalus or extra-axial collection.  There is a mild degree of generalized atrophy.  After contrast administration, no abnormal enhancement occurs.  Major arteries and veins are patent.  No marrow space pathology of the skull or skull base is identified.  IMPRESSION: Atrophy.  Minimal white matter foci affecting the cerebral hemispheres.  No acute or reversible findings.  No sign of myeloma involvement.  Original Report Authenticated By: Jules Schick, M.D.   Other results:    Patient Active Hospital Problem List:  1. Bright red blood per rectum: Patient presents with bright red blood parenchyma however his hemoglobin is stable as compared to previous hemoglobins. Patient does have a history of hemorrhoids and I would say that this is consistent with his hemorrhoids except for the severe abdominal pain some left lower quadrant. The CT scan shows some findings consistent with enteritis and the jejunum. In light of this I will go ahead and put the patient off and on a diet of clear liquids. Gastroenterology has been consulted and I will start the patient empirically on IV antibiotics including Flagyl and Cipro.  2. Generalized chronic pain: The patient has chronic generalized pain and I will resume his home medications. However in an effort to not suppress any  evidence of fever I will change his Percocet to plain oxycodone. I will also hold the Robaxin in light of its potential effect of the GI tract.  3. Diabetes type 2: In light of the decreased oral intake for this patient and his hold his oral hyperglycemics. The patient recovered on a sliding scale basis.  4. Hypertension: The patient be resumed on his usual medications.  DVT prophylaxis: The patient will utilize SCDs for DVT prophylaxis.  GI prophylaxis: Patient received protonic for GI prophylaxis.   Larine Fielding A. 07/12/2011, 9:12 AM

## 2011-07-12 NOTE — ED Notes (Signed)
Pt request IV to be removed,  Pt prefers his port accessed,  This is being done by [employer] RN

## 2011-07-12 NOTE — ED Provider Notes (Addendum)
History     CSN: [id number]  Arrival date & time 07/12/11  0204   First MD Initiated Contact with Patient 07/12/11 0250      Chief Complaint  Patient presents with  . Rectal Bleeding    (Consider location/radiation/quality/duration/timing/severity/associated sxs/prior treatment) HPI Comments: Patient presents tonight with bright red Blood per rectum with 2 episodes this evening.  He denies that there was any stool but only that there was bloody to the water and some possible clots.  He denies prior history of GI bleeds.  He also has some associated suprapubic and lower, pain.  He has some mild nausea but no vomiting.  No prior abdominal surgeries.  No antiplatelet or anticoagulation agents.  Patient did just start a new medication and he believes it may be related.  Patient is a 61 y.o. male presenting with hematochezia. The history is provided by the patient. No language interpreter was used.  Rectal Bleeding  The current episode started today. The onset was sudden. The pain is moderate. The stool is described as bloody. Associated symptoms include abdominal pain and nausea. Pertinent negatives include no fever, no vomiting, no hematuria, no chest pain, no headaches, no coughing and no rash.    Past Medical History  Diagnosis Date  . Multiple myeloma   . Hypertension   . Renal insufficiency   . Diverticulosis   . Hemorrhoids   . Type 2 diabetes mellitus   . Anxiety disorder   . Urinary retention   . Anemia   . Diabetic neuropathy   . Constipation   . Herpes zoster   . Arthritis   . Hyperlipidemia   . Myeloma 12/31/2010  . Arthralgia of multiple joints 12/31/2010  . Anemia, iron deficiency 03/12/2011  . 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 Knee arthroscopy     left  . Portacath placement   . Cataract extraction     bi-lateral  . Transurethral resection of prostate 01/2011    Dr. at [facility] in [place]    Family  History  Problem Relation Age of Onset  . Breast cancer Sister   . Breast cancer Mother   . Diabetes Sister   . Bone cancer Father   . Colon cancer Neg Hx   . Heart disease Sister   . Heart disease Mother   . Heart disease Daughter   . Throat cancer Brother   . Stomach cancer Brother     History  Substance Use Topics  . Smoking status: Never Smoker   . Smokeless tobacco: Never Used  . Alcohol Use: No      Review of Systems  Constitutional: Negative.  Negative for fever and chills.  HENT: Negative.   Eyes: Negative.  Negative for discharge and redness.  Respiratory: Negative.  Negative for cough and shortness of breath.   Cardiovascular: Negative.  Negative for chest pain.  Gastrointestinal: Positive for nausea, abdominal pain, blood in stool, hematochezia and anal bleeding. Negative for vomiting.  Genitourinary: Negative.  Negative for hematuria.  Musculoskeletal: Negative.  Negative for back pain.  Skin: Negative.  Negative for color change and rash.  Neurological: Negative for syncope and headaches.  Hematological: Negative.  Negative for adenopathy.  Psychiatric/Behavioral: Negative.  Negative for confusion.  All other systems reviewed and are negative.    Allergies  Iodine  Home Medications   Current Outpatient Rx  Name Route Sig Dispense Refill  . AMLODIPINE BESYLATE 10 MG  PO TABS Oral Take 10 mg by mouth daily.      . ERGOCALCIFEROL 50000 UNITS PO CAPS Oral Take 50,000 Units by mouth once a week.      . FUROSEMIDE 20 MG PO TABS Oral Take 20 mg by mouth daily.      Marland Kitchen GLIPIZIDE 5 MG PO TABS Oral Take 5 mg by mouth daily.     Marland Kitchen HYDRALAZINE HCL 25 MG PO TABS Oral Take 25 mg by mouth 3 (three) times daily.    Marland Kitchen KETOROLAC TROMETHAMINE 0.4 % OP SOLN Right Eye Place 1 drop into the right eye 3 (three) times daily.    Marland Kitchen METHOCARBAMOL 500 MG PO TABS Oral Take 500 mg by mouth 4 (four) times daily.     Marland Kitchen POTASSIUM CHLORIDE CRYS ER 20 MEQ PO TBCR Oral Take 20 mEq by  mouth 2 (two) times daily.    Marland Kitchen PRESCRIPTION MEDICATION Oral Take 1 capsule by mouth at bedtime. Naltrexone 3mg     . SENNOSIDES-DOCUSATE SODIUM 8.6-50 MG PO TABS Oral Take 1 tablet by mouth daily.    Marland Kitchen TAMSULOSIN HCL 0.4 MG PO CAPS Oral Take 0.4 mg by mouth daily.     . TRIAMTERENE-HCTZ 37.5-25 MG PO TABS Oral Take 1 tablet by mouth daily.        BP 171/93  Pulse 93  Temp(Src) 98.3 F (36.8 C) (Oral)  Resp 17  SpO2 100%  Physical Exam  Nursing note and vitals reviewed. Constitutional: He is oriented to person, place, and time. He appears well-developed and well-nourished.  Non-toxic appearance. He does not have a sickly appearance.  HENT:  Head: Normocephalic and atraumatic.  Eyes: Conjunctivae, EOM and lids are normal. Pupils are equal, round, and reactive to light.  Neck: Trachea normal, normal range of motion and full passive range of motion without pain. Neck supple.  Cardiovascular: Normal rate, regular rhythm and normal heart sounds.   Pulmonary/Chest: Effort normal and breath sounds normal. No respiratory distress.  Abdominal: Soft. Normal appearance. He exhibits no distension. There is tenderness. There is no rebound and no CVA tenderness.       Mild tenderness to palpation in the suprapubic region and the left lower quadrant, no rebound or guarding  Genitourinary:       Normal rectal tone.  No gross stool in the vault but there is a small amount of red blood.  It is Hemoccult positive  Musculoskeletal: Normal range of motion.  Neurological: He is alert and oriented to person, place, and time. He has normal strength.  Skin: Skin is warm, dry and intact. No rash noted.  Psychiatric: He has a normal mood and affect. His behavior is normal. Judgment and thought content normal.    ED Course  Procedures (including critical care time)  Labs Reviewed - No data to display Results for orders placed during the hospital encounter of 07/12/11  CBC      Component Value Range   WBC  11.5 (*) 4.0 - 10.5 (K/uL)   RBC 4.20 (*) 4.22 - 5.81 (MIL/uL)   Hemoglobin 10.4 (*) 13.0 - 17.0 (g/dL)   HCT 32.3 (*) 39.0 - 52.0 (%)   MCV 76.9 (*) 78.0 - 100.0 (fL)   MCH 24.8 (*) 26.0 - 34.0 (pg)   MCHC 32.2  30.0 - 36.0 (g/dL)   RDW 15.7 (*) 11.5 - 15.5 (%)   Platelets 253  150 - 400 (K/uL)  DIFFERENTIAL      Component Value Range   Neutrophils  Relative 76  43 - 77 (%)   Neutro Abs 8.7 (*) 1.7 - 7.7 (K/uL)   Lymphocytes Relative 17  12 - 46 (%)   Lymphs Abs 1.9  0.7 - 4.0 (K/uL)   Monocytes Relative 6  3 - 12 (%)   Monocytes Absolute 0.7  0.1 - 1.0 (K/uL)   Eosinophils Relative 2  0 - 5 (%)   Eosinophils Absolute 0.2  0.0 - 0.7 (K/uL)   Basophils Relative 0  0 - 1 (%)   Basophils Absolute 0.1  0.0 - 0.1 (K/uL)  BASIC METABOLIC PANEL      Component Value Range   Sodium 136  135 - 145 (mEq/L)   Potassium 3.1 (*) 3.5 - 5.1 (mEq/L)   Chloride 94 (*) 96 - 112 (mEq/L)   CO2 30  19 - 32 (mEq/L)   Glucose, Bld 165 (*) 70 - 99 (mg/dL)   BUN 18  6 - 23 (mg/dL)   Creatinine, Ser 2.36 (*) 0.50 - 1.35 (mg/dL)   Calcium 8.8  8.4 - 10.5 (mg/dL)   GFR calc non Af Amer 28 (*) >90 (mL/min)   GFR calc Af Amer 32 (*) >90 (mL/min)  TYPE AND SCREEN      Component Value Range   ABO/RH(D) O POS     Antibody Screen NEG     Sample Expiration 07/15/2011    APTT      Component Value Range   aPTT 32  24 - 37 (seconds)  PROTIME-INR      Component Value Range   Prothrombin Time 14.1  11.6 - 15.2 (seconds)   INR 1.07  0.00 - 1.49    Ct Abdomen Pelvis Wo Contrast  07/12/2011  *RADIOLOGY REPORT*  Clinical Data: Rectal bleeding, left lower quadrant pain.  CT ABDOMEN AND PELVIS WITHOUT CONTRAST  Technique:  Multidetector CT imaging of the abdomen and pelvis was performed following the standard protocol without intravenous contrast.  Comparison: 09/18/2010  Findings: Mild bibasilar scarring versus atelectasis.  Heart size upper normal limits.  Trace pericardial fluid versus thickening.   Intra-abdominal organ evaluation is degraded given the absence of intravenous contrast.  Within this limitation, unremarkable liver, spleen, pancreas, biliary system, adrenal glands.  Bilateral perinephric fat stranding is nonspecific.  No hydronephrosis or hydroureter.  There is fluid distension of the colon and colonic diverticulosis without overt evidence for diverticulitis.  Normal appendix. Heterogeneous attenuation of within the stomach likely corresponds to ingested contrast and food.  No overt evidence for bowel obstruction.  There are loops of proximal small bowel/jejunum that are mildly thickened, nonspecific given the absence of intravenous contrast or ingested contrast within these loops.  No free intraperitoneal air or fluid.  No lymphadenopathy.  Normal caliber vasculature.  Thin-walled bladder.  Multilevel degenerative changes of the imaged spine. No acute or aggressive appearing osseous lesion.  Bilateral SI joint ankylosis.  IMPRESSION: Bowel evaluation is limited given the absence of intravenous contrast and ingested contrast having only reached the stomach and proximal small bowel.  There are some mildly thickened loops of jejunum for which enteritis is not excluded (ischemic, infectious, and inflammatory considerations).  Fluid distended colon.  Colonic diverticulosis without overt evidence for diverticulitis.  Original Report Authenticated By: Suanne Marker, M.D.    MDM  Patient presents with new onset primary blood per rectum.  Given he also has lower abdominal pain this could potentially be related to diverticulitis so I will obtain a scan.  Patient is allergic to iodine stomach  the CT scan noncontrast.  Patient will receive laboratory studies to include a CBC to look for anemia as well as a PT/PTT and a type and screen in case the patient doesn't require blood transfusion.  We'll treat patient's pain with morphine and his nausea with Zofran.  At this time patient is hemodynamically  stable.  Lezlie Octave, MD 07/12/11 [phone]  Patient without obvious signs of diverticulitis on his CT scan.  There is some question of possible enteritis but it is unclear.  Patient does not have a fever here and has only a minimally elevated white count which is nonspecific at this time.  Patient's pain is only minimally improved with the morphine and Dilaudid so I will give him a repeat dose of Dilaudid.  At this point in time the patient feels that he may still have some rectal bleeding and I feel he warrants admission for observation for signs of continued bleeding given that we do not have an obvious infectious cause at this time.    Lezlie Octave, MD 07/12/11 [phone]  Patient did have further rectal bleeding here in the emergency department of a small quantity at this time.  Lezlie Octave, MD 07/12/11 [phone]  Patient discussed with Dr. Quay Burow here.  Patient is hemodynamically stable without significant bleeding to necessitate medical step down bed at this time.  He does not require transfusion at this time given his stable hemoglobin.  Given the question of possible enteritis finding on today's CAT scan and discussion with Dr. Quay Burow we will start the patient on Cipro and Flagyl.  Lezlie Octave, MD 07/12/11 [phone]

## 2011-07-12 NOTE — ED Notes (Signed)
MD at bedside. 

## 2011-07-12 NOTE — Consult Note (Signed)
[facility] Consultation  Requesting Provider: Dr. Zigmund Daniel Primary Care Physician:  Barbette Merino, MD, MD Primary Gastroenterologist:  Dr. Fuller Plan  Reason for Consultation:  Hematochezia  Assessment:  2 days of hematochezia of unclear etiology, it is associated with diffuse abdominal pain but a soft and only mildly tender abdomen. CT scan has shown some thickened loops of jejunum but this is an unenhanced scan i.e. no IV contrast so sensitivity is diminished. He does have a history of hemorrhoids and diverticulosis on colonoscopy in 2008. He has not lost a great deal of blood but he has persistent bleeding on 6 or 7 occasions, without associated diarrhea. Rectal exam is currently without any stool or blood.      Recommendations: Colonoscopy to investigate for cause of bleeding. Risks benefits and indications are reviewed with the patient and he understands and agrees to proceed.     HPI: [name] is a 61 y.o. male with multiple myeloma and other comorbidities including chronic renal insufficiency. He went to get his Aredia infusion 2 days prior to admission. He said he didn't feel well when he went. He returned home and went to the bathroom and passed blood. He's done at 6 or 7 times since that night of his infusion. He's had some nausea but no vomiting. He has diffuse abdominal pain described as somewhat crampy. He has chronic abdominal pain he tells me without clear cause. So this is not necessarily new but it is worse than usual. He has not been around anybody with diarrhea or other infectious problems that he can tell me, he denies any diarrhea at this time. He has been admitted and given IV hydration and started on antibiotics and feels about the same at this point. He was to start naltrexone for fibromyalgia but I don't believe he has done so yet.  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 Past Medical History  Diagnosis Date  . Multiple myeloma   . Hypertension   . Renal insufficiency   . Diverticulosis   . Hemorrhoids   . Type 2 diabetes mellitus   . Anxiety disorder   . Urinary retention   . Anemia   . Diabetic neuropathy   . Constipation   . Herpes zoster   . Arthritis   . Hyperlipidemia   . Myeloma 12/31/2010  . Arthralgia of multiple joints 12/31/2010  . Anemia, iron deficiency 03/12/2011  . Anemia of renal disease 03/12/2011   hypertensive lower esophageal sphincter on esophageal manometry   Past Surgical History  Procedure Date  . Knee arthroscopy     left  . Portacath placement   . Cataract extraction     bi-lateral  . Transurethral resection of prostate 01/2011    Dr. at [facility] in [place]   upper GI endoscopy Colonoscopy  Prior to Admission medications   Medication Sig Start Date End Date Taking? Authorizing Provider  amLODipine (NORVASC) 10 MG tablet Take 10 mg by mouth daily.     Yes Historical Provider, MD  ergocalciferol (VITAMIN D2) 50000 UNITS capsule Take 50,000 Units by mouth once a week.     Yes Historical Provider, MD  furosemide (LASIX) 20 MG tablet Take 20 mg by mouth daily.     Yes Historical Provider, MD  glipiZIDE (GLUCOTROL) 5 MG tablet Take 5 mg by mouth daily.    Yes Historical Provider,  MD  hydrALAZINE (APRESOLINE) 25 MG tablet Take 25 mg by mouth 3 (three) times daily.   Yes Historical Provider, MD  ketorolac (ACULAR) 0.4 % SOLN Place 1 drop into the right eye 3 (three) times daily.   Yes Historical Provider, MD  methocarbamol (ROBAXIN) 500 MG tablet Take 500 mg by mouth 4 (four) times daily.    Yes Historical Provider, MD  Oxycodone HCl (OXYCONTIN) 60 MG TB12 Take 1 tablet by mouth 2 (two) times daily.   Yes Historical Provider, MD  oxyCODONE-acetaminophen (PERCOCET) 10-325 MG per tablet Take 1 tablet by mouth every 4 (four) hours  as needed. Pain   Yes Historical Provider, MD  potassium chloride SA (K-DUR,KLOR-CON) 20 MEQ tablet Take 20 mEq by mouth 2 (two) times daily.   Yes Historical Provider, MD  senna-docusate (SENOKOT-S) 8.6-50 MG per tablet Take 1 tablet by mouth daily.   Yes Historical Provider, MD  Tamsulosin HCl (FLOMAX) 0.4 MG CAPS Take 0.4 mg by mouth daily.    Yes Historical Provider, MD  triamterene-hydrochlorothiazide (MAXZIDE-25) 37.5-25 MG per tablet Take 1 tablet by mouth daily.     Yes Historical Provider, MD    Current Facility-Administered Medications  Medication Dose Route Frequency Provider Last Rate Last Dose  . 0.9 %  sodium chloride infusion   Intravenous Once Lezlie Octave, MD 125 mL/hr at 07/12/11 0640    . 0.9 % NaCl with KCl 40 mEq / L  infusion   Intravenous Continuous Leana Gamer, MD 50 mL/hr at 07/12/11 1133    . amLODipine (NORVASC) tablet 10 mg  10 mg Oral Daily Leana Gamer, MD   10 mg at 07/12/11 1130  . ciprofloxacin (CIPRO) IVPB 400 mg  400 mg Intravenous Once Lezlie Octave, MD   400 mg at 07/12/11 0802  . ciprofloxacin (CIPRO) IVPB 400 mg  400 mg Intravenous Q12H Leana Gamer, MD      . hydrALAZINE (APRESOLINE) tablet 25 mg  25 mg Oral TID Leana Gamer, MD   25 mg at 07/12/11 1641  . HYDROmorphone (DILAUDID) injection 1 mg  1 mg Intravenous Once Lezlie Octave, MD   1 mg at 07/12/11 0434  . HYDROmorphone (DILAUDID) injection 1 mg  1 mg Intravenous Once Lezlie Octave, MD   1 mg at 07/12/11 0559  . HYDROmorphone (DILAUDID) injection 1 mg  1 mg Intravenous NOW Leana Gamer, MD   1 mg at 07/12/11 0859  . HYDROmorphone (DILAUDID) injection 2 mg  2 mg Intravenous Once Lezlie Octave, MD   2 mg at 07/12/11 0655  . insulin aspart (novoLOG) injection 0-5 Units  0-5 Units Subcutaneous QHS Leana Gamer, MD      . insulin aspart (novoLOG) injection 0-9 Units  0-9 Units Subcutaneous TID WC Leana Gamer, MD      . ketorolac  (ACULAR) 0.5 % ophthalmic solution 1 drop  1 drop Right Eye TID Leana Gamer, MD   1 drop at 07/12/11 1641  . metoCLOPramide (REGLAN) injection 10 mg  10 mg Intravenous Once Gatha Mayer, MD      . metroNIDAZOLE (FLAGYL) IVPB 500 mg  500 mg Intravenous Once Lezlie Octave, MD   500 mg at 07/12/11 [id number]  . metroNIDAZOLE (FLAGYL) IVPB 500 mg  500 mg Intravenous Q8H Leana Gamer, MD   500 mg at 07/12/11 1428  . morphine 4 MG/ML injection 4 mg  4 mg Intravenous Q4H PRN Leana Gamer,  MD   4 mg at 07/12/11 1637  . morphine 4 MG/ML injection 6 mg  6 mg Intravenous Once Lezlie Octave, MD   6 mg at 07/12/11 0332  . ondansetron (ZOFRAN) injection 4 mg  4 mg Intravenous Once Lezlie Octave, MD   4 mg at 07/12/11 0333  . oxyCODONE (Oxy IR/ROXICODONE) immediate release tablet 10 mg  10 mg Oral Q4H PRN Leana Gamer, MD      . oxyCODONE (OXYCONTIN) 12 hr tablet 60 mg  60 mg Oral Q12H Leana Gamer, MD   60 mg at 07/12/11 1900  . pantoprazole (PROTONIX) EC tablet 40 mg  40 mg Oral Q0600 Leana Gamer, MD      . peg 3350 powder (MOVIPREP) 100 G kit 100 g  1 kit Oral Once Gatha Mayer, MD      . potassium chloride 20 MEQ/15ML (10%) liquid 40 mEq  40 mEq Oral Once Lezlie Octave, MD   40 mEq at 07/12/11 0512  . potassium chloride SA (K-DUR,KLOR-CON) CR tablet 20 mEq  20 mEq Oral BID Leana Gamer, MD      . potassium chloride SA (K-DUR,KLOR-CON) CR tablet 40 mEq  40 mEq Oral Q2H Leana Gamer, MD   40 mEq at 07/12/11 1640  . Tamsulosin HCl (FLOMAX) capsule 0.4 mg  0.4 mg Oral Daily Leana Gamer, MD   0.4 mg at 07/12/11 1130  . triamterene-hydrochlorothiazide (MAXZIDE-25) 37.5-25 MG per tablet 1 each  1 each Oral Daily Leana Gamer, MD   1 each at 07/12/11 1130  . DISCONTD: ketorolac (ACULAR) 0.4 % ophthalmic solution 1 drop  1 drop Right Eye TID Leana Gamer, MD      . DISCONTD: Oxycodone HCl TB12 60 mg  1 tablet Oral Daily  Leana Gamer, MD        Allergies as of 07/12/2011 - Review Complete 07/12/2011  Allergen Reaction Noted  . Iodine Rash     Family History  Problem Relation Age of Onset  . Breast cancer Sister   . Breast cancer Mother   . Diabetes Sister   . Bone cancer Father   . Colon cancer Neg Hx   . Heart disease Sister   . Heart disease Mother   . Heart disease Daughter   . Throat cancer Brother   . Stomach cancer Brother     History   Social History  . Marital Status: Divorced    Spouse Name: N/A    Number of Children: 3  . Years of Education: N/A   Occupational History  . retired    Social History Main Topics  . Smoking status: Never Smoker   . Smokeless tobacco: Never Used  . Alcohol Use: No  . Drug Use: No  . Sexually Active: Not on file      Review of Systems: Positive for those things mentioned in the history of present illness All other review of systems negative except as mentioned in the HPI.  Physical Exam: Vital signs in last 24 hours: Temp:  [98.3 F (36.8 C)-99.8 F (37.7 C)] 98.3 F (36.8 C) (05/19 1426) Pulse Rate:  [77-94] 83  (05/19 1426) Resp:  [15-18] 16  (05/19 1426) BP: (109-171)/(65-93) 109/65 mmHg (05/19 1426) SpO2:  [96 %-100 %] 97 % (05/19 1426) Weight:  [190 lb 1.6 oz (86.229 kg)] 190 lb 1.6 oz (86.229 kg) (05/19 0922) Last BM Date: 07/12/11 General:   Alert,  Well-developed, well-nourished,  looking uncomfortable and mildly ill Eyes:  Sclera clear, no icterus.   Conjunctiva pink. Mouth:  No deformity or lesions.  Oropharynx pink & moist. Neck:  Supple; no masses or thyromegaly. Lungs:  Clear throughout to auscultation.  Port-A-Cath in the upper chest Heart:  Regular rate and rhythm; no murmurs, clicks, rubs,  or gallops. Abdomen:  Soft, protuberant, bowel sounds present but diminished some, diffusely tender without rebound or paramedial findings and no mass..   Rectal:   No mass, no stool no blood Extremities:  Without clubbing  or edema. Neurologic:  Alert and  oriented x 3 Lymph Nodes:  No significant cervical or supraclavicular adenopathy. Psych:  Alert and cooperative. Normal mood and affect.      Lab Results:  Basename 07/12/11 1735 07/12/11 0358 07/10/11 0743  WBC -- 11.5* 7.2  HGB 10.4* 10.4* 10.7*  HCT 31.2* 32.3* 32.0*  PLT -- 253 250   BMET  Basename 07/12/11 0358  NA 136  K 3.1*  CL 94*  CO2 30  GLUCOSE 165*  BUN 18  CREATININE 2.36*  CALCIUM 8.8     PT/INR  Basename 07/12/11 0358  LABPROT 14.1  INR 1.07      Studies/Results: Ct Abdomen Pelvis Wo Contrast  07/12/2011  *RADIOLOGY REPORT*  Clinical Data: Rectal bleeding, left lower quadrant pain.  CT ABDOMEN AND PELVIS WITHOUT CONTRAST  Technique:  Multidetector CT imaging of the abdomen and pelvis was performed following the standard protocol without intravenous contrast.  Comparison: 09/18/2010  Findings: Mild bibasilar scarring versus atelectasis.  Heart size upper normal limits.  Trace pericardial fluid versus thickening.  Intra-abdominal organ evaluation is degraded given the absence of intravenous contrast.  Within this limitation, unremarkable liver, spleen, pancreas, biliary system, adrenal glands.  Bilateral perinephric fat stranding is nonspecific.  No hydronephrosis or hydroureter.  There is fluid distension of the colon and colonic diverticulosis without overt evidence for diverticulitis.  Normal appendix. Heterogeneous attenuation of within the stomach likely corresponds to ingested contrast and food.  No overt evidence for bowel obstruction.  There are loops of proximal small bowel/jejunum that are mildly thickened, nonspecific given the absence of intravenous contrast or ingested contrast within these loops.  No free intraperitoneal air or fluid.  No lymphadenopathy.  Normal caliber vasculature.  Thin-walled bladder.  Multilevel degenerative changes of the imaged spine. No acute or aggressive appearing osseous lesion.   Bilateral SI joint ankylosis.  : Bowel evaluation is limited given the absence of intravenous contrast and ingested contrast having only reached the stomach and proximal small bowel.  There are some mildly thickened loops of jejunum for which enteritis is not excluded (ischemic, infectious, and inflaIMPRESSIONmmatory considerations).  Fluid distended colon.  Colonic diverticulosis without overt evidence for diverticulitis.  Original Report Authenticated By: Suanne Marker, M.D.   Images viewed   Previous Endoscopies: EGD January 2010, mild gastritis Colonoscopy September 2008 internal hemorrhoids and diverticulosis throughout most of the colon     LOS: 0 days    I appreciate the opportunity to care for this patient.   @Karthik Whittinghill  Simonne Maffucci, MD, [facility] [phone] (pager) 07/12/2011 7:34 PM@

## 2011-07-12 NOTE — ED Notes (Signed)
Pt states he has headache and hurts all over,  Dr Thad Ranger aware, awaiting new orders,  Pt aware

## 2011-07-12 NOTE — ED Notes (Signed)
Patient c/o rectal bleeding that happened twice tonight.  He denies prior history of GI bleeds. C/o pain "all over" 10/10.   He has some mild nausea but no vomiting. No prior abdominal surgeries. No blood thinners. Patient did just start a new medication and he believes it may be related.

## 2011-07-13 ENCOUNTER — Encounter (HOSPITAL_COMMUNITY)
Admission: EM | Disposition: A | Discharge status: Home / Self Care | Payer: Self-pay | Source: Home / Self Care | Attending: Internal Medicine

## 2011-07-13 ENCOUNTER — Encounter: Payer: Self-pay | Admitting: Hematology & Oncology

## 2011-07-13 ENCOUNTER — Encounter (HOSPITAL_COMMUNITY): Payer: Self-pay | Admitting: *Deleted

## 2011-07-13 DIAGNOSIS — C9000 Multiple myeloma not having achieved remission: Secondary | ICD-10-CM

## 2011-07-13 DIAGNOSIS — N189 Chronic kidney disease, unspecified: Secondary | ICD-10-CM

## 2011-07-13 DIAGNOSIS — R1319 Other dysphagia: Secondary | ICD-10-CM

## 2011-07-13 DIAGNOSIS — K625 Hemorrhage of anus and rectum: Secondary | ICD-10-CM

## 2011-07-13 DIAGNOSIS — E118 Type 2 diabetes mellitus with unspecified complications: Secondary | ICD-10-CM

## 2011-07-13 DIAGNOSIS — I10 Essential (primary) hypertension: Secondary | ICD-10-CM

## 2011-07-13 DIAGNOSIS — K5730 Diverticulosis of large intestine without perforation or abscess without bleeding: Secondary | ICD-10-CM

## 2011-07-13 DIAGNOSIS — D631 Anemia in chronic kidney disease: Secondary | ICD-10-CM

## 2011-07-13 DIAGNOSIS — E1165 Type 2 diabetes mellitus with hyperglycemia: Secondary | ICD-10-CM

## 2011-07-13 HISTORY — PX: COLONOSCOPY: SHX5424

## 2011-07-13 LAB — GLUCOSE, CAPILLARY
Glucose-Capillary: 186 mg/dL — ABNORMAL HIGH (ref 70–99)
Glucose-Capillary: 136 mg/dL — ABNORMAL HIGH (ref 70–99)

## 2011-07-13 LAB — BASIC METABOLIC PANEL
BUN: 9 mg/dL (ref 6–23)
Chloride: 107 mEq/L (ref 96–112)
Creatinine, Ser: 2.07 mg/dL — ABNORMAL HIGH (ref 0.50–1.35)
GFR calc Af Amer: 38 mL/min — ABNORMAL LOW (ref >90)
GFR calc non Af Amer: 33 mL/min — ABNORMAL LOW (ref >90)

## 2011-07-13 SURGERY — COLONOSCOPY
Anesthesia: Moderate Sedation

## 2011-07-13 MED ORDER — MIDAZOLAM HCL 10 MG/2ML IJ SOLN
INTRAMUSCULAR | Status: AC
  Filled 2011-07-13: qty 4

## 2011-07-13 MED ORDER — FENTANYL CITRATE 0.05 MG/ML IJ SOLN
INTRAMUSCULAR | Status: AC
  Filled 2011-07-13: qty 4

## 2011-07-13 MED ORDER — METHOCARBAMOL 500 MG PO TABS
500.0000 mg | ORAL_TABLET | Freq: Four times a day (QID) | ORAL | Status: DC
  Administered 2011-07-13: 500 mg via ORAL
  Filled 2011-07-13: qty 1

## 2011-07-13 MED ORDER — CIPROFLOXACIN HCL 500 MG PO TABS: 500.0000 mg | ORAL_TABLET | Freq: Two times a day (BID) | ORAL | Status: AC

## 2011-07-13 MED ORDER — DIPHENHYDRAMINE HCL 50 MG/ML IJ SOLN
INTRAMUSCULAR | Status: AC
  Filled 2011-07-13: qty 1

## 2011-07-13 MED ORDER — FENTANYL NICU IV SYRINGE 50 MCG/ML
INJECTION | INTRAMUSCULAR | Status: DC | PRN
  Administered 2011-07-13 (×4): 25 ug via INTRAVENOUS

## 2011-07-13 MED ORDER — MIDAZOLAM HCL 5 MG/5ML IJ SOLN
INTRAMUSCULAR | Status: DC | PRN
  Administered 2011-07-13: 1 mg via INTRAVENOUS
  Administered 2011-07-13 (×3): 2 mg via INTRAVENOUS

## 2011-07-13 MED ORDER — METRONIDAZOLE 500 MG PO TABS: 500.0000 mg | ORAL_TABLET | Freq: Three times a day (TID) | ORAL | Status: AC

## 2011-07-13 NOTE — Discharge Summary (Signed)
[name] MRN: [id number] DOB/AGE: 02-28-1950 61 y.o.  Admit date: 07/12/2011 Discharge date: 07/13/2011  Primary Care Physician:  Barbette Merino, MD, MD   Discharge Diagnoses:   Patient Active Problem List  Diagnoses  . GERD  . DIVERTICULOSIS-COLON  . Other dysphagia  . Myeloma  . Diabetes mellitus type II  . Arthralgia of multiple joints  . Anemia, iron deficiency  . Anemia of renal disease  . Hematochezia    DISCHARGE MEDICATION: Medication List  As of 07/13/2011  3:42 PM   TAKE these medications         amLODipine 10 MG tablet   Commonly known as: NORVASC   Take 10 mg by mouth daily.      ciprofloxacin 500 MG tablet   Commonly known as: CIPRO   Take 1 tablet (500 mg total) by mouth 2 (two) times daily.      ergocalciferol 50000 UNITS capsule   Commonly known as: VITAMIN D2   Take 50,000 Units by mouth once a week.      furosemide 20 MG tablet   Commonly known as: LASIX   Take 20 mg by mouth daily.      glipiZIDE 5 MG tablet   Commonly known as: GLUCOTROL   Take 5 mg by mouth daily.      hydrALAZINE 25 MG tablet   Commonly known as: APRESOLINE   Take 25 mg by mouth 3 (three) times daily.      ketorolac 0.4 % Soln   Commonly known as: ACULAR   Place 1 drop into the right eye 3 (three) times daily.      methocarbamol 500 MG tablet   Commonly known as: ROBAXIN   Take 500 mg by mouth 4 (four) times daily.      metroNIDAZOLE 500 MG tablet   Commonly known as: FLAGYL   Take 1 tablet (500 mg total) by mouth 3 (three) times daily.      oxyCODONE-acetaminophen 10-325 MG per tablet   Commonly known as: PERCOCET   Take 1 tablet by mouth every 4 (four) hours as needed. Pain      OXYCONTIN 60 MG Tb12   Generic drug: Oxycodone HCl   Take 1 tablet by mouth 2 (two) times daily.      potassium chloride SA 20 MEQ tablet   Commonly known as: K-DUR,KLOR-CON   Take 20 mEq by mouth 2 (two) times daily.      senna-docusate 8.6-50 MG per tablet   Commonly known  as: Senokot-S   Take 1 tablet by mouth daily.      Tamsulosin HCl 0.4 MG Caps   Commonly known as: FLOMAX   Take 0.4 mg by mouth daily.      triamterene-hydrochlorothiazide 37.5-25 MG per tablet   Commonly known as: MAXZIDE-25   Take 1 tablet by mouth daily.              Consults:     SIGNIFICANT DIAGNOSTIC STUDIES:  Ct Abdomen Pelvis Wo Contrast  07/12/2011  *RADIOLOGY REPORT*  Clinical Data: Rectal bleeding, left lower quadrant pain.  CT ABDOMEN AND PELVIS WITHOUT CONTRAST  Technique:  Multidetector CT imaging of the abdomen and pelvis was performed following the standard protocol without intravenous contrast.  Comparison: 09/18/2010  Findings: Mild bibasilar scarring versus atelectasis.  Heart size upper normal limits.  Trace pericardial fluid versus thickening.  Intra-abdominal organ evaluation is degraded given the absence of intravenous contrast.  Within this limitation, unremarkable liver, spleen, pancreas, biliary system, adrenal glands.  Bilateral perinephric fat stranding is nonspecific.  No hydronephrosis or hydroureter.  There is fluid distension of the colon and colonic diverticulosis without overt evidence for diverticulitis.  Normal appendix. Heterogeneous attenuation of within the stomach likely corresponds to ingested contrast and food.  No overt evidence for bowel obstruction.  There are loops of proximal small bowel/jejunum that are mildly thickened, nonspecific given the absence of intravenous contrast or ingested contrast within these loops.  No free intraperitoneal air or fluid.  No lymphadenopathy.  Normal caliber vasculature.  Thin-walled bladder.  Multilevel degenerative changes of the imaged spine. No acute or aggressive appearing osseous lesion.  Bilateral SI joint ankylosis.  IMPRESSION: Bowel evaluation is limited given the absence of intravenous contrast and ingested contrast having only reached the stomach and proximal small bowel.  There are some mildly  thickened loops of jejunum for which enteritis is not excluded (ischemic, infectious, and inflammatory considerations).  Fluid distended colon.  Colonic diverticulosis without overt evidence for diverticulitis.  Original Report Authenticated By: Suanne Marker, M.D.   Mr Jeri Cos [id number] Contrast  06/20/2011  *RADIOLOGY REPORT*  Clinical Data: Generalized weakness.  History of myeloma.  MRI HEAD WITHOUT AND WITH CONTRAST  Technique:  Multiplanar, multiecho pulse sequences of the brain and surrounding structures were obtained according to standard protocol without and with intravenous contrast  Contrast: 56mL MULTIHANCE GADOBENATE DIMEGLUMINE 529 MG/ML IV SOLN  Comparison: 03/26/2010 and multiple previous  Findings: Diffusion imaging does not show any acute or subacute infarction.  The brainstem and cerebellum are normal.  The cerebral hemispheres show a few scattered punctate foci of T2 and FLAIR signal in the white matter, not likely significant.  No cortical or large vessel territory infarction.  No mass lesion, hemorrhage, hydrocephalus or extra-axial collection.  There is a mild degree of generalized atrophy.  After contrast administration, no abnormal enhancement occurs.  Major arteries and veins are patent.  No marrow space pathology of the skull or skull base is identified.  IMPRESSION: Atrophy.  Minimal white matter foci affecting the cerebral hemispheres.  No acute or reversible findings.  No sign of myeloma involvement.  Original Report Authenticated By: Jules Schick, M.D.      CARDIAC CATH & OTHER PROCEDURES: 1) Mild diverticulosis in the hepatic flexure to ascending colon  2) Internal hemorrhoids   No results found for this or any previous visit (from the past 240 hour(s)).  BRIEF ADMITTING H & P: The patient is a 61 year old gentleman with a history of multiple myeloma, fibromyalgia, hemorrhoids and diverticulosis who went to the bathroom this morning and noted that he had bright red  blood per rectum. The patient states that he was in bed and felt as though he urinated on himself. He went to the bathroom he had a gush of bright red blood per rectum. He states that he did not respond to this however he went back to the bathroom had the same thing occurred thus came to the hospital for further evaluation and management.  The patient states that he is under the care of his oncologist and receives Aredia IV every 2 months. He was seen in his oncologist office yesterday and started on Valtrex only she took last night for treatment of his fibromyalgia. He denies any fevers,sweats or chills. He denies any nausea vomiting or diarrhea. The patient does have significant abdominal pain most prominently in the left lower quadrant. He states the pain is excruciating however he is unable to really qualify or quantify  the pain. He is also unable to give any provocative or palliative features associated with the pain.    Hospital Course:  Present on Admission:  .Hematochezia: Patient had bright red blood per rectum. The patient was given supportive care bowel rest. He was seen by gastroenterology and they performed a colonoscopy this morning which showed internal hemorrhoids and diverticulosis. A CT scan showed findings consistent with an enteritis. The patient was started on IV antibiotics and converted over to oral antibiotics for a total of 10 days in light of the impressive clinical features in addition to the radiological findings. Endoscopically the patient only had diverticulosis in the colon and internal hemorrhoids which explains the bleeding. Recommendations from gastroenterology are a high-fiber diet with liberal fluids. Gastroenterology will follow up in the office with the patient had an appointment arranged by them. Patient has gait is meal and tolerated without any difficulty and is medically stable for discharge home.  Disposition and Follow-up:    Discharge Orders    Future  Appointments: Provider: Department: Dept Phone: Center:   07/15/2011 2:00 PM Hayden Pedro, MD [facility] [phone] None   07/16/2011 12:00 PM Clearnce Sorrel, MD [facility] [phone] None   09/11/2011 8:15 AM Celso Sickle [facility] [phone] None   09/11/2011 8:30 AM Volanda Napoleon, MD [facility] [phone] None   09/11/2011 9:00 AM [facility] [phone] None     Future Orders Please Complete By Expires   Diet Carb Modified      Comments:   High fiber diet   Activity as tolerated - No restrictions         DISCHARGE EXAM:  General: Alert, awake, oriented x3, in no acute distress.  Vital Signs: Blood pressure 112/65, pulse 99, temperature 97 F (36.1 C), temperature source Oral, resp. rate 18, height 5\' 11"  (1.803 m), weight 86.229 kg (190 lb 1.6 oz), SpO2 97.00%. HEENT: [place]/AT PEERL, EOMI  Neck: Trachea midline, no masses, no thyromegal,y no JVD, no carotid bruit  OROPHARYNX: Moist, No exudate/ erythema/lesions.  Heart: Regular rate and rhythm, without murmurs, rubs, gallops, PMI non-displaced, no heaves or thrills on palpation.  Lungs: Clear to auscultation, no wheezing or rhonchi noted. No increased vocal fremitus resonant to percussion  Abdomen: Soft, nontender, nondistended, positive bowel sounds, no masses no hepatosplenomegaly noted..  Neuro: No focal neurological deficits noted cranial nerves II through XII grossly intact. DTRs 2+ bilaterally upper and lower extremities. Strength normal in bilateral upper and lower extremities.   Basename 07/13/11 0530 07/12/11 0358  NA 141 136  K 3.9 3.1*  CL 107 94*  CO2 24 30  GLUCOSE 147* 165*  BUN 9 18  CREATININE 2.07* 2.36*  CALCIUM 8.4 8.8  MG -- --  PHOS -- --   No results found for this basename: AST:2,ALT:2,ALKPHOS:2,BILITOT:2,PROT:2,ALBUMIN:2 in the last 72 hours No results found for this basename: LIPASE:2,AMYLASE:2 in the last 72 hours  Basename 07/13/11 0530 07/12/11  1735 07/12/11 0358  WBC -- -- 11.5*  NEUTROABS -- -- 8.7*  HGB 9.9* 10.4* --  HCT 30.3* 31.2* --  MCV -- -- 76.9*  PLT -- -- 253   The above discharge process including face-to-face time approximately 37 minutes.  Signed: Aadan Chenier A. 07/13/2011, 3:42 PM

## 2011-07-13 NOTE — Progress Notes (Signed)
Pt discharged home with mother in stable condition. Discharge instructions given. Pt verbalized understanding.

## 2011-07-13 NOTE — Interval H&P Note (Signed)
History and Physical Interval Note:  07/13/2011 10:08 AM  [name]  has presented today for surgery, with the diagnosis of hematochezia  The various methods of treatment have been discussed with the patient and family. After consideration of risks, benefits and other options for treatment, the patient has consented to  Procedure(s) (LRB): COLONOSCOPY (N/A) as a surgical intervention .  The patients' history has been reviewed, patient examined, no change in status, stable for surgery.  I have reviewed the patients' chart and labs.  Questions were answered to the patient's satisfaction.     Pricilla Riffle. Fuller Plan MD Marval Regal

## 2011-07-13 NOTE — Progress Notes (Signed)
[facility] Progress Note  Subjective: No recurrent bleeding. No GI complaints.  Objective:  Vital signs in last 24 hours: Temp:  [98.2 F (36.8 C)-98.6 F (37 C)] 98.2 F (36.8 C) (05/20 0906) Pulse Rate:  [82-83] 82  (05/19 2052) Resp:  [11-47] 18  (05/20 1035) BP: (109-148)/(65-88) 115/68 mmHg (05/20 1035) SpO2:  [95 %-100 %] 100 % (05/20 1035) Last BM Date: 07/13/11 General:   Alert,  Well-developed,  white male in NAD Heart:  Regular rate and rhythm; no murmurs Abdomen:  Soft, nontender and nondistended. Normal bowel sounds, without guarding, and without rebound.   Extremities:  Without edema. Neurologic:  Alert and  oriented x4;  grossly normal neurologically. Psych:  Alert and cooperative. Normal mood and affect.  Intake/Output from previous day: 05/19 0701 - 05/20 0700 In: 1412.5 [P.O.:480; I.V.:932.5] Out: 7 [Urine:4; Stool:3] Intake/Output this shift:    Lab Results:  Basename 07/13/11 0530 07/12/11 1735 07/12/11 0358  WBC -- -- 11.5*  HGB 9.9* 10.4* 10.4*  HCT 30.3* 31.2* 32.3*  PLT -- -- 253   BMET  Basename 07/13/11 0530 07/12/11 0358  NA 141 136  K 3.9 3.1*  CL 107 94*  CO2 24 30  GLUCOSE 147* 165*  BUN 9 18  CREATININE 2.07* 2.36*  CALCIUM 8.4 8.8   PT/INR  Basename 07/12/11 0358  LABPROT 14.1  INR 1.07    Studies/Results: Ct Abdomen Pelvis Wo Contrast  07/12/2011  *RADIOLOGY REPORT*  Clinical Data: Rectal bleeding, left lower quadrant pain.  CT ABDOMEN AND PELVIS WITHOUT CONTRAST  Technique:  Multidetector CT imaging of the abdomen and pelvis was performed following the standard protocol without intravenous contrast.  Comparison: 09/18/2010  Findings: Mild bibasilar scarring versus atelectasis.  Heart size upper normal limits.  Trace pericardial fluid versus thickening.  Intra-abdominal organ evaluation is degraded given the absence of intravenous contrast.  Within this limitation, unremarkable liver, spleen, pancreas, biliary  system, adrenal glands.  Bilateral perinephric fat stranding is nonspecific.  No hydronephrosis or hydroureter.  There is fluid distension of the colon and colonic diverticulosis without overt evidence for diverticulitis.  Normal appendix. Heterogeneous attenuation of within the stomach likely corresponds to ingested contrast and food.  No overt evidence for bowel obstruction.  There are loops of proximal small bowel/jejunum that are mildly thickened, nonspecific given the absence of intravenous contrast or ingested contrast within these loops.  No free intraperitoneal air or fluid.  No lymphadenopathy.  Normal caliber vasculature.  Thin-walled bladder.  Multilevel degenerative changes of the imaged spine. No acute or aggressive appearing osseous lesion.  Bilateral SI joint ankylosis.  IMPRESSION: Bowel evaluation is limited given the absence of intravenous contrast and ingested contrast having only reached the stomach and proximal small bowel.  There are some mildly thickened loops of jejunum for which enteritis is not excluded (ischemic, infectious, and inflammatory considerations).  Fluid distended colon.  Colonic diverticulosis without overt evidence for diverticulitis.  Original Report Authenticated By: Suanne Marker, M.D.     Assessment / Plan:  1. Hematochezia, resolved. Suspected hemorrhoidal bleeding vs diverticular. See colonoscopy report. Advance diet and OK for discharge today and follow up with PCP and Dr [name].   2. Abnormal CT of jejunum likely due to absence of contrast and not an underlying disease. No further evaluation at this time. If GI symptoms return will reconsider further evaluation.  3. Anemia, chronic. Follow up with Dr. [name]. Hb=9.9 today.   4. Dysphagia, hypertensive LES and oropharyngeal dysfunction-currently being evaluated  by Neurology as an outpatient.  Active Problems:  Hematochezia   LOS: 1 day   [name] Plan MD [facility] 07/13/2011, 10:45 AM

## 2011-07-13 NOTE — Op Note (Signed)
[facility] [place],   [id number]  COLONOSCOPY PROCEDURE REPORT  PATIENT:  [name], [name]  MR#:  [id number] BIRTHDATE:  1950-12-19, 65 yrs. old  GENDER:  male ENDOSCOPIST:  Norberto Sorenson T. Fuller Plan, MD, [facility]  PROCEDURE DATE:  07/13/2011 PROCEDURE:  Colonoscopy [id number] ASA CLASS:  Class II INDICATIONS:  1) hematochezia MEDICATIONS:   These medications were titrated to patient response per physician's verbal order, Fentanyl 100 mcg IV, Versed 7 mg IV DESCRIPTION OF PROCEDURE:   After the risks benefits and alternatives of the procedure were thoroughly explained, informed consent was obtained.  Digital rectal exam was performed and revealed no abnormalities.   The [id number] endoscope was introduced through the anus and advanced to the cecum, which was identified by both the appendix and ileocecal valve, without limitations. The quality of the prep was adequate, using MoviPrep.  The instrument was then slowly withdrawn as the colon was fully examined.<<PROCEDUREIMAGES>> FINDINGS:  Mild diverticulosis was found in the hepatic flexure to ascending colon. Small mucosal abrasions due to suctioning or looping near the appendiceal orifice. No blood in the colon. Otherwiser normal colonosocopy. Retroflexed view revealed internal hemorrhoids, small.  The time to cecum =  4  minutes. The scope was then withdrawn (time =  14  min) from the patient and the procedure completed.  COMPLICATIONS:  None  ENDOSCOPIC IMPRESSION: 1) Mild diverticulosis in the hepatic flexure to ascending colon  2) Internal hemorrhoids  RECOMMENDATIONS: 1) Hold aspirin, aspirin products, and anti-inflammatory medication for 1 week. 2) High fiber diet with liberal fluid intake. 3) Continue current colorectal screening for "routine risk" patients with a repeat colonoscopy in 10 years.  Pricilla Riffle. Fuller Plan, MD, [facility]  n. eSIGNED:   Pricilla Riffle. Lindzy Rupert at 07/13/2011 10:44 AM  Samuel Jester,  [id number]

## 2011-07-14 ENCOUNTER — Telehealth: Payer: Self-pay | Admitting: *Deleted

## 2011-07-14 ENCOUNTER — Encounter (HOSPITAL_COMMUNITY): Payer: Self-pay | Admitting: Gastroenterology

## 2011-07-14 LAB — IRON AND TIBC
%SAT: 40 % (ref 20–55)
TIBC: 204 ug/dL — ABNORMAL LOW (ref 215–435)
UIBC: 122 ug/dL — ABNORMAL LOW (ref 125–400)

## 2011-07-14 LAB — FERRITIN: Ferritin: 1150 ng/mL — ABNORMAL HIGH (ref 22–322)

## 2011-07-14 LAB — PROTEIN ELECTROPHORESIS, SERUM, WITH REFLEX
Albumin ELP: 50.6 % — ABNORMAL LOW (ref 55.8–66.1)
Alpha-1-Globulin: 3.9 % (ref 2.9–4.9)
Total Protein, Serum Electrophoresis: 7.5 g/dL (ref 6.0–8.3)

## 2011-07-14 LAB — IGG, IGA, IGM
IgA: 408 mg/dL — ABNORMAL HIGH (ref 68–379)
IgG (Immunoglobin G), Serum: 1640 mg/dL — ABNORMAL HIGH (ref 650–1600)

## 2011-07-14 NOTE — Telephone Encounter (Addendum)
Message copied by Orlando Penner on Tue Jul 14, 2011  3:50 PM ------      Message from: Burney Gauze R      Created: Mon Jul 13, 2011  5:50 PM       Call - myeloma still is not active!!!  Laurey Arrow This message given to pt.  Voiced understanding.

## 2011-07-15 ENCOUNTER — Ambulatory Visit (INDEPENDENT_AMBULATORY_CARE_PROVIDER_SITE_OTHER): Payer: Medicaid Other | Admitting: Ophthalmology

## 2011-07-15 DIAGNOSIS — H2700 Aphakia, unspecified eye: Secondary | ICD-10-CM

## 2011-07-16 ENCOUNTER — Other Ambulatory Visit (INDEPENDENT_AMBULATORY_CARE_PROVIDER_SITE_OTHER): Payer: Medicaid Other

## 2011-07-16 ENCOUNTER — Encounter: Payer: Self-pay | Admitting: Neurology

## 2011-07-16 ENCOUNTER — Ambulatory Visit (INDEPENDENT_AMBULATORY_CARE_PROVIDER_SITE_OTHER): Payer: Medicaid Other | Admitting: Neurology

## 2011-07-16 ENCOUNTER — Telehealth: Payer: Self-pay | Admitting: Neurology

## 2011-07-16 VITALS — BP 118/76 | HR 76 | Wt 190.0 lb

## 2011-07-16 DIAGNOSIS — G7000 Myasthenia gravis without (acute) exacerbation: Secondary | ICD-10-CM

## 2011-07-16 LAB — CK: Total CK: 248 U/L — ABNORMAL HIGH (ref 7–232)

## 2011-07-16 NOTE — Telephone Encounter (Signed)
Left a message for the patient to return my call re: Athena testing at a cost of $1370.00.

## 2011-07-16 NOTE — Progress Notes (Signed)
Dear Dr. Jonelle Sidle,  I saw  [name] back in [facility] for his problem with swallowing.  As you may recall, he is a 61 y.o. year old male with a history of severe brain trauma and multiple myeloma who has progressive difficulty with swallowing over the last 2-3 years.  At his first visit I also noted generalized weakness.  I was concerned about motor neuron disease vs. myasthenia gravis.  AchR ab - binding, modulating -.  MRI brain, given the history of head trauma was unremarkable, with no obvious sequalae from brain trauma.  Unfortunately the patient was acutely ill and had to go to hospital.  As a result he was not able to go to his NCS/EMG.  He continues to complain of problems swallowing, as well as double vision and difficulty with body weakness.  Medical history, social history, and family history were reviewed and have not changed since the last clinic visit.  Current Outpatient Prescriptions on File Prior to Visit  Medication Sig Dispense Refill  . amLODipine (NORVASC) 10 MG tablet Take 10 mg by mouth daily.        . ciprofloxacin (CIPRO) 500 MG tablet Take 1 tablet (500 mg total) by mouth 2 (two) times daily.  16 tablet  0  . ergocalciferol (VITAMIN D2) 50000 UNITS capsule Take 50,000 Units by mouth once a week.        . furosemide (LASIX) 20 MG tablet Take 20 mg by mouth daily.        Marland Kitchen glipiZIDE (GLUCOTROL) 5 MG tablet Take 5 mg by mouth daily.       . hydrALAZINE (APRESOLINE) 25 MG tablet Take 25 mg by mouth 3 (three) times daily.      Marland Kitchen ketorolac (ACULAR) 0.4 % SOLN Place 1 drop into the right eye 3 (three) times daily.      . methocarbamol (ROBAXIN) 500 MG tablet Take 500 mg by mouth 4 (four) times daily.       . metroNIDAZOLE (FLAGYL) 500 MG tablet Take 1 tablet (500 mg total) by mouth 3 (three) times daily.  24 tablet  0  . Oxycodone HCl (OXYCONTIN) 60 MG TB12 Take 1 tablet by mouth 2 (two) times daily.      Marland Kitchen oxyCODONE-acetaminophen (PERCOCET) 10-325 MG  per tablet Take 1 tablet by mouth every 4 (four) hours as needed. Pain      . potassium chloride SA (K-DUR,KLOR-CON) 20 MEQ tablet Take 20 mEq by mouth 2 (two) times daily.      Marland Kitchen senna-docusate (SENOKOT-S) 8.6-50 MG per tablet Take 1 tablet by mouth daily.      . Tamsulosin HCl (FLOMAX) 0.4 MG CAPS Take 0.4 mg by mouth daily.       Marland Kitchen triamterene-hydrochlorothiazide (MAXZIDE-25) 37.5-25 MG per tablet Take 1 tablet by mouth daily.        Marland Kitchen DISCONTD: omeprazole (PRILOSEC) 40 MG capsule Take 1 capsule (40 mg total) by mouth daily.  30 capsule  11  . DISCONTD: potassium chloride (KLOR-CON) 20 MEQ packet Take 20 mEq by mouth daily.         Allergies  Allergen Reactions  . Iodine Rash    ROS:  13 systems were reviewed and are notable for numbness in his bilateral feet.  All other review of systems are unremarkable.  Exam: . Filed Vitals:   07/16/11 1158  BP: 118/76  Pulse: 76  Weight: 190 lb (86.183 kg)   In general, well nourished appearing man in  NAD.  Cardiovascular:  The patient has a regular rate and rhythm and no carotid bruits.  Fundoscopy:  Disks are flat. Vessel caliber within normal limits.  Mental status:  The patient is oriented to person, place and time. Recent and remote memory are intact. Attention span and concentration are normal. Language including repetition, naming, following commands are intact.  Fund of knowledge of current and historical events, as well as vocabulary are normal.  Cranial Nerves:  Pupils are equally round and reactive to light. Visual fields full to confrontation.EOMS appear to have full versions. Xcover test does not bring out a deviation. However, he does have subjective diplopia on lateral gaze. Facial sensation and muscles of mastication are intact. Synkinesis of the left lower face, right lower facial droop. Tongue deviation to the right. Mild neck extension neck flexion weakness. Marland Kitchen Hearing intact to bilateral finger rub.Shoulder shrug intact.   He also has bilateral ptosis, but not clearly fatiguable.  ? [place] lid twitchl  Motor: The patient has wasting of his thenar and hypothenar eminence of his left hand.. There are no adventitious movements.  Muscle Exam   SA  ElFlex  ElExt  WrFlex  WrExt  FingAb  Hip Flex  Hip Ext  KneeExt  Knee Flex  FootDF  FootPF   Right  5  5  4+  5  4+  4+  4  [place]  4  4  4   [place]   Left  5  5  4  5  4  4  5   [place]  4  4  4      Reflexes:  Biceps Triceps Brachioradialis Knee Ankle  Right 1+ 1+ 1+ 1+ 0  Left 1+ 1+ 1+ 3+ 0  Toes down  Coordination: Normal finger to nose. No dysdiadokinesia.  Sensation is decreased in legs in a length dep manner to temperature, absent vibration in feet, position impaired.  Gait and Station are normal. Romberg is negative   Impression/Recommendations: 1.  Dysphagia/diffuse muscle weakness - No clear etiology.  I am very interested to see what Dr. Jannifer Franklin finds on needle exam as well as repetitive nerve stimulation.  I am going to try to send of MUSK abs as well.  I have sent off a CK. 2.  Sensory changes/left thenar wasting - He obviously has a neuropathy, but I am very uncertain if this relates to his swallowing problems.  I will order an MRI C and L spine if it looks like he has diffuse radiculopathies on EMG exam.  We will see the patient back in 2 months.  Kavin Leech Jacelyn Grip, MD [facility] Neurology, [place]

## 2011-07-16 NOTE — Patient Instructions (Signed)
Go to the basement to have your labs drawn today.  Your NCV/EMG will be scheduled at [facility] located at [address] in [place] on Monday, July 1st at 10:15 am.  Please arrive 15 minutes prior to your scheduled appointment. [id number]    I will call you with additional information regarding the special lab test Dr. Jacelyn Grip is recommending.

## 2011-07-21 NOTE — Telephone Encounter (Signed)
Pt not interested in having test done right now.  Too expensive.

## 2011-07-21 NOTE — Telephone Encounter (Signed)
Left a message with a male to have the patient return my call.

## 2011-09-10 ENCOUNTER — Ambulatory Visit: Payer: Medicaid Other | Admitting: Neurology

## 2011-09-10 ENCOUNTER — Telehealth: Payer: Self-pay | Admitting: Neurology

## 2011-09-10 NOTE — Telephone Encounter (Signed)
Message copied by Angelica Pou on Thu Sep 10, 2011 12:40 PM ------      Message from: [place], [place]: Thu Sep 10, 2011  9:44 AM       Just let the patient know that I was just checking in as I didn't see him at his appt today.  It would be helpful in order to evaluate his swallowing problem to get an MRI of his C-spine.  Ask if this would be possible.  I saw that it was ordered before, but not sure why it didn't get done.

## 2011-09-10 NOTE — Telephone Encounter (Signed)
Called and spoke with the patient. He states he did not know he had an appointment today. He also states he didn't know he was to have "x-rays" (MRI C-spine). The patient was rescheduled for a f/u appointment with Dr. Jacelyn Grip on 09/15/11 at noon. The patient states he will come.

## 2011-09-11 ENCOUNTER — Other Ambulatory Visit (HOSPITAL_BASED_OUTPATIENT_CLINIC_OR_DEPARTMENT_OTHER): Payer: Medicaid Other | Admitting: Lab

## 2011-09-11 ENCOUNTER — Ambulatory Visit (HOSPITAL_BASED_OUTPATIENT_CLINIC_OR_DEPARTMENT_OTHER): Payer: Medicaid Other

## 2011-09-11 ENCOUNTER — Ambulatory Visit (HOSPITAL_BASED_OUTPATIENT_CLINIC_OR_DEPARTMENT_OTHER): Payer: Medicaid Other | Admitting: Hematology & Oncology

## 2011-09-11 VITALS — BP 134/75 | HR 76 | Temp 97.4°F | Ht 70.0 in | Wt 191.0 lb

## 2011-09-11 DIAGNOSIS — D631 Anemia in chronic kidney disease: Secondary | ICD-10-CM

## 2011-09-11 DIAGNOSIS — D509 Iron deficiency anemia, unspecified: Secondary | ICD-10-CM

## 2011-09-11 DIAGNOSIS — N289 Disorder of kidney and ureter, unspecified: Secondary | ICD-10-CM

## 2011-09-11 DIAGNOSIS — M797 Fibromyalgia: Secondary | ICD-10-CM

## 2011-09-11 DIAGNOSIS — C9000 Multiple myeloma not having achieved remission: Secondary | ICD-10-CM

## 2011-09-11 DIAGNOSIS — D649 Anemia, unspecified: Secondary | ICD-10-CM

## 2011-09-11 LAB — CBC WITH DIFFERENTIAL (CANCER CENTER ONLY)
BASO#: 0 10*3/uL (ref 0.0–0.2)
BASO%: 0.5 % (ref 0.0–2.0)
EOS%: 3.5 % (ref 0.0–7.0)
Eosinophils Absolute: 0.3 10*3/uL (ref 0.0–0.5)
HCT: 31.5 % — ABNORMAL LOW (ref 38.7–49.9)
HGB: 10.5 g/dL — ABNORMAL LOW (ref 13.0–17.1)
LYMPH#: 2.2 10*3/uL (ref 0.9–3.3)
LYMPH%: 28.9 % (ref 14.0–48.0)
MCH: 26.1 pg — ABNORMAL LOW (ref 28.0–33.4)
MCHC: 33.3 g/dL (ref 32.0–35.9)
MCV: 78 fL — ABNORMAL LOW (ref 82–98)
MONO#: 0.8 10*3/uL (ref 0.1–0.9)
MONO%: 9.9 % (ref 0.0–13.0)
NEUT#: 4.4 10*3/uL (ref 1.5–6.5)
NEUT%: 57.2 % (ref 40.0–80.0)
Platelets: 199 10*3/uL (ref 145–400)
RBC: 4.03 10*6/uL — ABNORMAL LOW (ref 4.20–5.70)
RDW: 14.7 % (ref 11.1–15.7)
WBC: 7.6 10*3/uL (ref 4.0–10.0)

## 2011-09-11 MED ORDER — SODIUM CHLORIDE 0.9 % IJ SOLN
10.0000 mL | INTRAMUSCULAR | Status: DC | PRN
  Administered 2011-09-11: 10 mL via INTRAVENOUS
  Filled 2011-09-11: qty 10

## 2011-09-11 MED ORDER — PAMIDRONATE DISODIUM 90 MG/10ML IV SOLN
60.0000 mg | Freq: Once | INTRAVENOUS | Status: AC
  Administered 2011-09-11: 60 mg via INTRAVENOUS
  Filled 2011-09-11: qty 500

## 2011-09-11 MED ORDER — SODIUM CHLORIDE 0.9 % IV SOLN
Freq: Once | INTRAVENOUS | Status: AC
  Administered 2011-09-11: 09:00:00 via INTRAVENOUS

## 2011-09-11 MED ORDER — DARBEPOETIN ALFA-POLYSORBATE 300 MCG/0.6ML IJ SOLN
300.0000 ug | Freq: Once | INTRAMUSCULAR | Status: AC
  Administered 2011-09-11: 300 ug via SUBCUTANEOUS

## 2011-09-11 MED ORDER — HEPARIN SOD (PORK) LOCK FLUSH 100 UNIT/ML IV SOLN
500.0000 [IU] | Freq: Once | INTRAVENOUS | Status: AC
  Administered 2011-09-11: 500 [IU] via INTRAVENOUS
  Filled 2011-09-11: qty 5

## 2011-09-11 NOTE — Progress Notes (Signed)
This office note has been dictated.

## 2011-09-11 NOTE — Patient Instructions (Signed)
Darbepoetin Alfa injection What is this medicine? DARBEPOETIN ALFA (dar be POE e tin AL fa) helps your body make more red blood cells. It is used to treat anemia caused by chronic kidney failure and chemotherapy. This medicine may be used for other purposes; ask your health care provider or pharmacist if you have questions. What should I tell my health care provider before I take this medicine? They need to know if you have any of these conditions: -blood clotting disorders or history of blood clots -cancer patient not on chemotherapy -cystic fibrosis -heart disease, such as angina, heart failure, or a history of a heart attack -hemoglobin level of 12 g/dL or greater -high blood pressure -low levels of folate, iron, or vitamin B12 -seizures -an unusual or allergic reaction to darbepoetin, erythropoietin, albumin, hamster proteins, latex, other medicines, foods, dyes, or preservatives -pregnant or trying to get pregnant -breast-feeding How should I use this medicine? This medicine is for injection into a vein or under the skin. It is usually given by a health care professional in a hospital or clinic setting. If you get this medicine at home, you will be taught how to prepare and give this medicine. Do not shake the solution before you withdraw a dose. Use exactly as directed. Take your medicine at regular intervals. Do not take your medicine more often than directed. It is important that you put your used needles and syringes in a special sharps container. Do not put them in a trash can. If you do not have a sharps container, call your pharmacist or healthcare provider to get one. Talk to your pediatrician regarding the use of this medicine in children. While this medicine may be used in children as young as 1 year for selected conditions, precautions do apply. Overdosage: If you think you have taken too much of this medicine contact a poison control center or emergency room at once. NOTE:  This medicine is only for you. Do not share this medicine with others. What if I miss a dose? If you miss a dose, take it as soon as you can. If it is almost time for your next dose, take only that dose. Do not take double or extra doses. What may interact with this medicine? Do not take this medicine with any of the following medications: -epoetin alfa This list may not describe all possible interactions. Give your health care provider a list of all the medicines, herbs, non-prescription drugs, or dietary supplements you use. Also tell them if you smoke, drink alcohol, or use illegal drugs. Some items may interact with your medicine. What should I watch for while using this medicine? Visit your prescriber or health care professional for regular checks on your progress and for the needed blood tests and blood pressure measurements. It is especially important for the doctor to make sure your hemoglobin level is in the desired range, to limit the risk of potential side effects and to give you the best benefit. Keep all appointments for any recommended tests. Check your blood pressure as directed. Ask your doctor what your blood pressure should be and when you should contact him or her. As your body makes more red blood cells, you may need to take iron, folic acid, or vitamin B supplements. Ask your doctor or health care provider which products are right for you. If you have kidney disease continue dietary restrictions, even though this medication can make you feel better. Talk with your doctor or health care professional about the   foods you eat and the vitamins that you take. What side effects may I notice from receiving this medicine? Side effects that you should report to your doctor or health care professional as soon as possible: -allergic reactions like skin rash, itching or hives, swelling of the face, lips, or tongue -breathing problems -changes in vision -chest pain -confusion, trouble speaking  or understanding -feeling faint or lightheaded, falls -high blood pressure -muscle aches or pains -pain, swelling, warmth in the leg -rapid weight gain -severe headaches -sudden numbness or weakness of the face, arm or leg -trouble walking, dizziness, loss of balance or coordination -seizures (convulsions) -swelling of the ankles, feet, hands -unusually weak or tired Side effects that usually do not require medical attention (report to your doctor or health care professional if they continue or are bothersome): -diarrhea -fever, chills (flu-like symptoms) -headaches -nausea, vomiting -redness, stinging, or swelling at site where injected This list may not describe all possible side effects. Call your doctor for medical advice about side effects. You may report side effects to FDA at 1-800-FDA-1088. Where should I keep my medicine? Keep out of the reach of children. Store in a refrigerator between 2 and 8 degrees C (36 and 46 degrees F). Do not freeze. Do not shake. Throw away any unused portion if using a single-dose vial. Throw away any unused medicine after the expiration date. NOTE: This sheet is a summary. It may not cover all possible information. If you have questions about this medicine, talk to your doctor, pharmacist, or health care provider.  2012, Elsevier/Gold Standard. (01/24/2008 10:23:57 AM)Pamidronate injection What is this medicine? PAMIDRONATE (pa mi DROE nate) slows calcium loss from bones. It is used to treat high calcium blood levels from cancer or Paget's disease. It is also used to treat bone pain and prevent fractures from certain cancers that have spread to the bone. This medicine may be used for other purposes; ask your health care provider or pharmacist if you have questions. What should I tell my health care provider before I take this medicine? They need to know if you have any of these conditions: -aspirin-sensitive asthma -dental disease -kidney  disease -an unusual or allergic reaction to pamidronate, other medicines, foods, dyes, or preservatives -pregnant or trying to get pregnant -breast-feeding How should I use this medicine? This medicine is for infusion into a vein. It is given by a health care professional in a hospital or clinic setting. Talk to your pediatrician regarding the use of this medicine in children. This medicine is not approved for use in children. Overdosage: If you think you have taken too much of this medicine contact a poison control center or emergency room at once. NOTE: This medicine is only for you. Do not share this medicine with others. What if I miss a dose? This does not apply. What may interact with this medicine? -certain antibiotics given by injection -medicines for inflammation or pain like ibuprofen, naproxen -some diuretics like bumetanide, furosemide -cyclosporine -parathyroid hormone -tacrolimus -teriparatide -thalidomide This list may not describe all possible interactions. Give your health care provider a list of all the medicines, herbs, non-prescription drugs, or dietary supplements you use. Also tell them if you smoke, drink alcohol, or use illegal drugs. Some items may interact with your medicine. What should I watch for while using this medicine? Visit your doctor or health care professional for regular checkups. It may be some time before you see the benefit from this medicine. Do not stop taking your medicine  unless your doctor tells you to. Your doctor may order blood tests or other tests to see how you are doing. Women should inform their doctor if they wish to become pregnant or think they might be pregnant. There is a potential for serious side effects to an unborn child. Talk to your health care professional or pharmacist for more information. You should make sure that you get enough calcium and vitamin D while you are taking this medicine. Discuss the foods you eat and the  vitamins you take with your health care professional. Some people who take this medicine have severe bone, joint, and/or muscle pain. This medicine may also increase your risk for a broken thigh bone. Tell your doctor right away if you have pain in your upper leg or groin. Tell your doctor if you have any pain that does not go away or that gets worse. What side effects may I notice from receiving this medicine? Side effects that you should report to your doctor or health care professional as soon as possible: -allergic reactions like skin rash, itching or hives, swelling of the face, lips, or tongue -black or tarry stools -changes in vision -eye inflammation, pain -high blood pressure -jaw pain, especially burning or cramping -muscle weakness -numb, tingling pain -swelling of feet or hands -trouble passing urine or change in the amount of urine -unable to move easily Side effects that usually do not require medical attention (report to your doctor or health care professional if they continue or are bothersome): -bone, joint, or muscle pain -constipation -dizzy, drowsy -fever -headache -loss of appetite -nausea, vomiting -pain at site where injected This list may not describe all possible side effects. Call your doctor for medical advice about side effects. You may report side effects to FDA at 1-800-FDA-1088. Where should I keep my medicine? This drug is given in a hospital or clinic and will not be stored at home. NOTE: This sheet is a summary. It may not cover all possible information. If you have questions about this medicine, talk to your doctor, pharmacist, or health care provider.  2012, Elsevier/Gold Standard. (08/08/2010 8:49:49 AM)

## 2011-09-11 NOTE — Progress Notes (Signed)
DIAGNOSES: 1. IgG kappa myeloma, remission. 2. 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 3. Insulin dependent diabetes. 4. Fibromyalgia.  CURRENT THERAPY: 1. Aredia 60 mg IV q.2 months. 2. Aranesp 300 mcg subcu as needed for hemoglobin less than 11. 3. IV iron as indicated.  INTERIM HISTORY:  Mr. Wierzbowski comes in for his followup.  He is still having issues with swallowing.  He has dysphagia.  He went out to [facility] for an evaluation.  He is not sure exactly what went on out there.  When I saw him last his fibromyalgia was a real problem.  I tried him on naltrexone.  Unfortunately, he had a reaction to the naltrexone.  As such, he is not on this any longer.  There have been no problems with the myeloma.  His last myeloma studies back in May did not show a monoclonal spike.  His IgG level was 1640 mg/dL.  His kappa light chain was 2.94 mg/dL.  His last iron studies showed a ferritin of 1150.  His iron saturation was 40%.  He says he now has to see a kidney doctor.  Apparently his kidney function has not been all that great.  He has mild chronic renal insufficiency ever since we have been seeing him.  Again, back in May, his BUN was 9 and creatinine was 2.07.  PHYSICAL EXAMINATION:  General:  This is a well-developed, well- nourished black gentleman in no obvious distress.  Vital signs: Temperature of 97.4, pulse 76, respiratory rate 18, blood pressure 134/75.  Weight is 191.  HEENT:  Head and neck shows a normocephalic, atraumatic skull.  There are no ocular or oral lesions.  There are no palpable cervical or supraclavicular lymph nodes.  Lungs:  Clear bilaterally.  Cardiac:  Regular rate and rhythm with a normal S1, S2. There are no murmurs, rubs or bruits.  Abdomen:  Soft with good bowel sounds.  There is no palpable abdominal mass.  There is no fluid wave. There is no palpable hepatosplenomegaly.  Back:  No tenderness over the spine, ribs or hips.   Extremities:  Shows no clubbing, cyanosis or edema.  There is no joint swelling, erythema or warmth.  Skin:  No rashes, ecchymosis or petechiae.  LABORATORY STUDIES:  White cell count 10.6, hemoglobin 10.5, hematocrit 31.5 and platelet count is 199.  IMPRESSION:  Mr. Ridder is a 61 year old gentleman with history of IgG kappa myeloma.  He has not required any treatment for this for a couple years.  He is on Aredia, which I do believe is having some affect on the myeloma in the fact that it has not recurred clinically.  His main issues are diabetes, fibromyalgia and renal insufficiency.  I do not believe the renal insufficiency is related to him having myeloma.  We will go ahead with the Aredia today.  I will also give him a dose of Aranesp.  I do want to see him back in another couple months for followup.    ______________________________ Volanda Napoleon, M.D. PRE/MEDQ  D:  09/11/2011  T:  09/11/2011  Job:  [id number]

## 2011-09-15 ENCOUNTER — Ambulatory Visit (INDEPENDENT_AMBULATORY_CARE_PROVIDER_SITE_OTHER): Payer: Medicaid Other | Admitting: Neurology

## 2011-09-15 ENCOUNTER — Encounter: Payer: Self-pay | Admitting: Neurology

## 2011-09-15 VITALS — BP 122/80 | HR 108 | Ht 69.0 in | Wt 189.0 lb

## 2011-09-15 DIAGNOSIS — M5410 Radiculopathy, site unspecified: Secondary | ICD-10-CM

## 2011-09-15 DIAGNOSIS — IMO0002 Reserved for concepts with insufficient information to code with codable children: Secondary | ICD-10-CM

## 2011-09-15 LAB — COMPREHENSIVE METABOLIC PANEL
ALT: 18 U/L (ref 0–53)
AST: 20 U/L (ref 0–37)
Alkaline Phosphatase: 62 U/L (ref 39–117)
Chloride: 98 mEq/L (ref 96–112)
Creatinine, Ser: 1.91 mg/dL — ABNORMAL HIGH (ref 0.50–1.35)
Total Bilirubin: 0.3 mg/dL (ref 0.3–1.2)

## 2011-09-15 LAB — IGG, IGA, IGM
IgA: 437 mg/dL — ABNORMAL HIGH (ref 68–379)
IgG (Immunoglobin G), Serum: 1820 mg/dL — ABNORMAL HIGH (ref 650–1600)
IgM, Serum: 86 mg/dL (ref 41–251)

## 2011-09-15 LAB — PROTEIN ELECTROPHORESIS, SERUM, WITH REFLEX
Alpha-2-Globulin: 13.3 % — ABNORMAL HIGH (ref 7.1–11.8)
Beta 2: 5.6 % (ref 3.2–6.5)
Beta Globulin: 5 % (ref 4.7–7.2)

## 2011-09-15 LAB — IRON AND TIBC
%SAT: 27 % (ref 20–55)
TIBC: 245 ug/dL (ref 215–435)

## 2011-09-15 LAB — KAPPA/LAMBDA LIGHT CHAINS: Kappa free light chain: 3.93 mg/dL — ABNORMAL HIGH (ref 0.33–1.94)

## 2011-09-15 NOTE — Patient Instructions (Addendum)
Your MRI is scheduled at the [facility] right beside [facility]. The address is [place]. Your appointment is Friday, September 18, 2011 at 7:00 pm.  Please arrive 15 minutes prior to your scheduled appointment.    OJ:[id number].

## 2011-09-15 NOTE — Progress Notes (Signed)
Dear Dr. Jonelle Sidle,  I saw  [name] back in [facility] for his problem with progressive dysphagia.  As you may recall, he is a 61 y.o. year old male with a history of multiple myeloma and traumatic brain injurydiagnosed in 2008 treated with Cytoxan and Velcade until January 2012 who has had progressive difficulty swallowing over the last several years. MBS revealed severe oralpharyngeal dysfunction.  AchR mod/bind ab were negative.  Unfortunately, Medicaid would not pay for MUSK abs.  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  RNS was negative.  Bulbar musculature was not needled.  Notably also MRI brain revealed only mild atrophy and MRI L-spine revealed some mild degenerative changes at L4-L5 which may effect the right L4 nerve root.  No other obvious neuro compression.  MRI C-spine was not done.  The patient follows up today commenting his swallowing is still impaired.   Medical history, social history, and family history were reviewed and have not changed since the last clinic visit.  Current Outpatient Prescriptions on File Prior to Visit  Medication Sig Dispense Refill  . amLODipine (NORVASC) 10 MG tablet Take 10 mg by mouth daily.        Marland Kitchen CARAFATE 1 GM/10ML suspension 2 (two) times daily.       . ergocalciferol (VITAMIN D2) 50000 UNITS capsule Take 50,000 Units by mouth once a week.        . furosemide (LASIX) 20 MG tablet Take 20 mg by mouth daily.        Marland Kitchen glipiZIDE (GLUCOTROL) 5 MG tablet Take 5 mg by mouth daily.       . hydrALAZINE (APRESOLINE) 25 MG tablet Take 25 mg by mouth 3 (three) times daily.      . Hypromellose (NATURAL BALANCE TEARS OP) Apply to eye every morning.      . methocarbamol (ROBAXIN) 500 MG tablet Take 500 mg by mouth 4  (four) times daily.       . Oxycodone HCl (OXYCONTIN) 60 MG TB12 Take 1 tablet by mouth 2 (two) times daily.      Marland Kitchen oxyCODONE-acetaminophen (PERCOCET) 10-325 MG per tablet Take 1 tablet by mouth every 4 (four) hours as needed. Pain      . potassium chloride SA (K-DUR,KLOR-CON) 20 MEQ tablet Take 20 mEq by mouth 2 (two) times daily.      Marland Kitchen senna-docusate (SENOKOT-S) 8.6-50 MG per tablet Take 1 tablet by mouth daily.      . Tamsulosin HCl (FLOMAX) 0.4 MG CAPS Take 0.4 mg by mouth daily.       Marland Kitchen triamterene-hydrochlorothiazide (MAXZIDE-25) 37.5-25 MG per tablet Take 1 tablet by mouth daily.        Marland Kitchen DISCONTD: omeprazole (PRILOSEC) 40 MG capsule Take 1 capsule (40 mg total) by mouth daily.  30 capsule  11  . DISCONTD: potassium chloride (KLOR-CON) 20 MEQ packet Take 20 mEq by mouth daily.         Allergies  Allergen Reactions  . Iodine Rash    ROS:  13 systems were reviewed and are notable for generalized weakness.  All other review of systems are unremarkable.  Exam: . Filed Vitals:   09/15/11 1148  BP: 122/80  Pulse: 108  Height: 5\' 9"  (1.753 m)  Weight: 189 lb (85.73 kg)  In general, well nourished appearing man in NAD.  Cardiovascular:  The patient has a regular rate and rhythm and no carotid bruits.  Fundoscopy:  Disks are flat. Vessel caliber within normal limits.  Mental status:  The patient is oriented to person, place and time. Recent and remote memory are intact. Attention span and concentration are normal. Language including repetition, naming, following commands are intact.  Fund of knowledge of current and historical events, as well as vocabulary are normal.  Cranial Nerves:  Pupils are equally round and reactive to light. Visual fields full to confrontation.EOMS appear to have full versions. Xcover test does not bring out a deviation. However, he does have subjective diplopia on lateral gaze. Facial sensation and muscles of mastication are intact. Synkinesis of the  left lower face, right lower facial droop. Tongue deviation to the right. Mild neck extension neck flexion weakness. Marland Kitchen Hearing intact to bilateral finger rub.Shoulder shrug intact. He also has bilateral ptosis, but not clearly fatiguable.  Motor: The patient has wasting of his thenar and hypothenar eminence of his left hand.. There are no adventitious movements.  Muscle Exam   SA ElFlex ElExt WrFlex WrExt FingAb Hip Flex Hip Ext KneeExt Knee Flex FootDF FootPF  Right  4+ 5 4+ 4 4+ 4 4 4 4 4 4 4   Left 4 4+ 4 4- 4 4 4 4 4 4 4 4    Biceps Triceps Brachioradialis Knee Ankle  Right 1+ 1+ 1+ 1+ 0  Left 1+ 1+ 1+ 3+ 0  Toes down  Coordination: Normal finger to nose. No dysdiadokinesia.  Sensation is decreased in legs in a length dep manner to temperature, absent vibration in feet, position impaired.  Gait and Station are normal. Romberg is negative  Impression/Recs: 1.  Dysphagia - I am concerned his dysphagia may be from his sensorimotor neuropathy likely related to his multiple myeloma.  He does not have the typical fluctuation I see in MG.  I am going to refer him to the [facility]romuscular program for consultation.  I am going to get an MRI C-spine as I am worried about his wide spread denervation in his arms.  However, this could be caused by his neuropathy.  MND also remains a concern.  If it is related to multiple myeloma then I would be interested in knowing whether other therapies for his MM may help his neuropathy.   Kavin Leech Jacelyn Grip, MD [facility] Neurology, [place]

## 2011-09-17 ENCOUNTER — Encounter: Payer: Self-pay | Admitting: Neurology

## 2011-09-18 ENCOUNTER — Inpatient Hospital Stay (HOSPITAL_COMMUNITY): Admission: RE | Admit: 2011-09-18 | Payer: Medicaid Other | Source: Ambulatory Visit

## 2011-09-18 ENCOUNTER — Other Ambulatory Visit: Payer: Self-pay | Admitting: Neurology

## 2011-09-18 DIAGNOSIS — R1310 Dysphagia, unspecified: Secondary | ICD-10-CM

## 2011-09-21 ENCOUNTER — Telehealth: Payer: Self-pay | Admitting: Neurology

## 2011-09-21 NOTE — Telephone Encounter (Signed)
Spoke with the patient. I let him know that we sent a referral to the [facility] as recommended by Dr. Jacelyn Grip. The records will be reviewed by an MD there and then they will call him to make the appointment. The patient did not have any additional questions or concerns at this time. [facility]--- (p) [phone]    [phone]   Fax confirmation received

## 2011-09-21 NOTE — Telephone Encounter (Signed)
Message copied by Angelica Pou on Mon Sep 21, 2011  9:06 AM ------      Message from: Clearnce Sorrel      Created: Fri Sep 18, 2011  2:28 PM       jan - can you do a referral to the neuromuscular clinic at [facility] for Mr. Flach -?dysphagia, please include EMG results and imaging results and my last note.

## 2011-11-12 ENCOUNTER — Ambulatory Visit (HOSPITAL_BASED_OUTPATIENT_CLINIC_OR_DEPARTMENT_OTHER): Payer: Medicaid Other

## 2011-11-12 ENCOUNTER — Ambulatory Visit (HOSPITAL_BASED_OUTPATIENT_CLINIC_OR_DEPARTMENT_OTHER): Payer: Medicaid Other | Admitting: Hematology & Oncology

## 2011-11-12 ENCOUNTER — Other Ambulatory Visit (HOSPITAL_BASED_OUTPATIENT_CLINIC_OR_DEPARTMENT_OTHER): Payer: Medicaid Other | Admitting: Lab

## 2011-11-12 VITALS — BP 144/74 | HR 78 | Temp 98.0°F | Resp 20 | Ht 69.0 in | Wt 190.0 lb

## 2011-11-12 DIAGNOSIS — N189 Chronic kidney disease, unspecified: Secondary | ICD-10-CM

## 2011-11-12 DIAGNOSIS — D509 Iron deficiency anemia, unspecified: Secondary | ICD-10-CM

## 2011-11-12 DIAGNOSIS — C9000 Multiple myeloma not having achieved remission: Secondary | ICD-10-CM

## 2011-11-12 DIAGNOSIS — N289 Disorder of kidney and ureter, unspecified: Secondary | ICD-10-CM

## 2011-11-12 DIAGNOSIS — E291 Testicular hypofunction: Secondary | ICD-10-CM

## 2011-11-12 DIAGNOSIS — D649 Anemia, unspecified: Secondary | ICD-10-CM

## 2011-11-12 DIAGNOSIS — M797 Fibromyalgia: Secondary | ICD-10-CM

## 2011-11-12 DIAGNOSIS — Z23 Encounter for immunization: Secondary | ICD-10-CM

## 2011-11-12 DIAGNOSIS — IMO0001 Reserved for inherently not codable concepts without codable children: Secondary | ICD-10-CM

## 2011-11-12 DIAGNOSIS — D631 Anemia in chronic kidney disease: Secondary | ICD-10-CM

## 2011-11-12 LAB — CBC WITH DIFFERENTIAL (CANCER CENTER ONLY)
Eosinophils Absolute: 0.2 10*3/uL (ref 0.0–0.5)
HCT: 36.2 % — ABNORMAL LOW (ref 38.7–49.9)
LYMPH%: 24.9 % (ref 14.0–48.0)
MCV: 74 fL — ABNORMAL LOW (ref 82–98)
MONO#: 0.7 10*3/uL (ref 0.1–0.9)
Platelets: 192 10*3/uL (ref 145–400)
RBC: 4.87 10*6/uL (ref 4.20–5.70)
WBC: 7.4 10*3/uL (ref 4.0–10.0)

## 2011-11-12 MED ORDER — SODIUM CHLORIDE 0.9 % IV SOLN
60.0000 mg | Freq: Once | INTRAVENOUS | Status: AC
  Administered 2011-11-12: 60 mg via INTRAVENOUS
  Filled 2011-11-12: qty 500

## 2011-11-12 MED ORDER — SODIUM CHLORIDE 0.9 % IV SOLN
Freq: Once | INTRAVENOUS | Status: AC
  Administered 2011-11-12: 09:00:00 via INTRAVENOUS

## 2011-11-12 MED ORDER — DARBEPOETIN ALFA-POLYSORBATE 300 MCG/0.6ML IJ SOLN: 300.0000 ug | Freq: Once | INTRAMUSCULAR | Status: DC

## 2011-11-12 MED ORDER — INFLUENZA VIRUS VACC SPLIT PF IM SUSP
0.5000 mL | Freq: Once | INTRAMUSCULAR | Status: AC
  Administered 2011-11-12: 0.5 mL via INTRAMUSCULAR
  Filled 2011-11-12: qty 0.5

## 2011-11-12 NOTE — Progress Notes (Signed)
This office note has been dictated.

## 2011-11-12 NOTE — Patient Instructions (Addendum)
Pamidronate injection What is this medicine? PAMIDRONATE (pa mi DROE nate) slows calcium loss from bones. It is used to treat high calcium blood levels from cancer or Paget's disease. It is also used to treat bone pain and prevent fractures from certain cancers that have spread to the bone. This medicine may be used for other purposes; ask your health care provider or pharmacist if you have questions. What should I tell my health care provider before I take this medicine? They need to know if you have any of these conditions: -aspirin-sensitive asthma -dental disease -kidney disease -an unusual or allergic reaction to pamidronate, other medicines, foods, dyes, or preservatives -pregnant or trying to get pregnant -breast-feeding How should I use this medicine? This medicine is for infusion into a vein. It is given by a health care professional in a hospital or clinic setting. Talk to your pediatrician regarding the use of this medicine in children. This medicine is not approved for use in children. Overdosage: If you think you have taken too much of this medicine contact a poison control center or emergency room at once. NOTE: This medicine is only for you. Do not share this medicine with others. What if I miss a dose? This does not apply. What may interact with this medicine? -certain antibiotics given by injection -medicines for inflammation or pain like ibuprofen, naproxen -some diuretics like bumetanide, furosemide -cyclosporine -parathyroid hormone -tacrolimus -teriparatide -thalidomide This list may not describe all possible interactions. Give your health care provider a list of all the medicines, herbs, non-prescription drugs, or dietary supplements you use. Also tell them if you smoke, drink alcohol, or use illegal drugs. Some items may interact with your medicine. What should I watch for while using this medicine? Visit your doctor or health care professional for regular  checkups. It may be some time before you see the benefit from this medicine. Do not stop taking your medicine unless your doctor tells you to. Your doctor may order blood tests or other tests to see how you are doing. Women should inform their doctor if they wish to become pregnant or think they might be pregnant. There is a potential for serious side effects to an unborn child. Talk to your health care professional or pharmacist for more information. You should make sure that you get enough calcium and vitamin D while you are taking this medicine. Discuss the foods you eat and the vitamins you take with your health care professional. Some people who take this medicine have severe bone, joint, and/or muscle pain. This medicine may also increase your risk for a broken thigh bone. Tell your doctor right away if you have pain in your upper leg or groin. Tell your doctor if you have any pain that does not go away or that gets worse. What side effects may I notice from receiving this medicine? Side effects that you should report to your doctor or health care professional as soon as possible: -allergic reactions like skin rash, itching or hives, swelling of the face, lips, or tongue -black or tarry stools -changes in vision -eye inflammation, pain -high blood pressure -jaw pain, especially burning or cramping -muscle weakness -numb, tingling pain -swelling of feet or hands -trouble passing urine or change in the amount of urine -unable to move easily Side effects that usually do not require medical attention (report to your doctor or health care professional if they continue or are bothersome): -bone, joint, or muscle pain -constipation -dizzy, drowsy -fever -headache -loss of  appetite -nausea, vomiting -pain at site where injected This list may not describe all possible side effects. Call your doctor for medical advice about side effects. You may report side effects to FDA at  1-800-FDA-1088. Where should I keep my medicine? This drug is given in a hospital or clinic and will not be stored at home. NOTE: This sheet is a summary. It may not cover all possible information. If you have questions about this medicine, talk to your doctor, pharmacist, or health care provider.  2012, [employer]. (08/08/2010 8:49:49 AM)

## 2011-11-14 NOTE — Progress Notes (Signed)
DIAGNOSES: 1. IgG kappa myeloma. 2. Anemia secondary to renal insufficiency. 3. Insulin-dependent diabetes. 4. Fibromyalgia.  CURRENT THERAPY: 1. Aredia 60 mg IV q.2 months. 2. __________ 300 mcg subcu as needed for hemoglobin less than 11. 3. IV iron as indicated.  INTERIM HISTORY:  [name] comes in for followup.  He is still having pain issues.  Again, I suspect that this is probably his fibromyalgia. I just would have a hard time believing that this is from myeloma.  To date, his myeloma panels have all been negative.  Back when we saw him in July, there was no monoclonal spike in his serum.  His IgG was 1820 mg/dL.  His kappa light chain was 3.93 mg/dL.  His blood sugars have been on the high side.  He has fibromyalgia.  This has always been a chronic issue for him.  His iron studies have been okay.  Back when we saw him in July, his ferritin was 1643.  His iron saturation was only 27%.  A lot of the ferritin elevation is from an acute0phase reactant.  PHYSICAL EXAMINATION:  This is a well-developed, well-nourished black gentleman in no obvious distress.  Vital signs:  Temperature of 98, pulse 78, respiratory rate 18, blood pressure 144/74.  Weight is 190. Head and neck:  Normocephalic, atraumatic skull.  There are no ocular or oral lesions.  There are no palpable cervical or supraclavicular lymph nodes.  Lungs:  Clear bilaterally.  Cardiac:  Regular rate and rhythm with a normal S1 and S2.  There are no murmurs, rubs or bruits. Abdomen:  Soft with good bowel sounds.  There is no palpable abdominal mass.  There is no palpable hepatosplenomegaly.  Back:  No tenderness over the spine, ribs, or hips.  Extremities:  No clubbing, cyanosis or edema.  Neurologic:  No focal neurological deficits.  LABORATORY STUDIES:  White cell count is 7.4, hemoglobin 12, hematocrit 36.2, platelet count 192.  His IgG level is 1810 mg/dL.  Kappa light chain is 2.52 mg/dL.  His ferritin is 1356  mg/dL.  His BUN is 20 and creatinine 2.15.  IMPRESSION:  [name] is a 61 year old gentleman with IgG kappa myeloma.  Again, this is not an issue from my point of view.  I do not see any evidence that he has active myeloma.  Again, his pain issues are from his other problems.  He has fibromyalgia.  He may have some collagen vascular issue also.  We will go ahead with his Aredia.  I really believe that this is helping with respect to keep his myeloma in remission.  We will go ahead and get him back in 2 more months.  He does not need any Aranesp or iron from my point of view.    ______________________________ Volanda Napoleon, M.D. PRE/MEDQ  D:  11/13/2011  T:  11/14/2011  Job:  [id number]

## 2011-11-16 LAB — COMPREHENSIVE METABOLIC PANEL
Albumin: 4.4 g/dL (ref 3.5–5.2)
Alkaline Phosphatase: 63 U/L (ref 39–117)
BUN: 20 mg/dL (ref 6–23)
CO2: 24 mEq/L (ref 19–32)
Calcium: 9.6 mg/dL (ref 8.4–10.5)
Chloride: 102 mEq/L (ref 96–112)
Glucose, Bld: 142 mg/dL — ABNORMAL HIGH (ref 70–99)
Potassium: 3.5 mEq/L (ref 3.5–5.3)

## 2011-11-16 LAB — IRON AND TIBC
Iron: 83 ug/dL (ref 42–165)
UIBC: 151 ug/dL (ref 125–400)

## 2011-11-16 LAB — KAPPA/LAMBDA LIGHT CHAINS: Kappa free light chain: 2.52 mg/dL — ABNORMAL HIGH (ref 0.33–1.94)

## 2011-11-16 LAB — FERRITIN: Ferritin: 1356 ng/mL — ABNORMAL HIGH (ref 22–322)

## 2011-11-16 LAB — PROTEIN ELECTROPHORESIS, SERUM, WITH REFLEX
Albumin ELP: 51.5 % — ABNORMAL LOW (ref 55.8–66.1)
Alpha-2-Globulin: 12.9 % — ABNORMAL HIGH (ref 7.1–11.8)
Beta 2: 6.2 % (ref 3.2–6.5)
Beta Globulin: 5 % (ref 4.7–7.2)

## 2011-11-16 LAB — IFE INTERPRETATION

## 2011-11-16 LAB — IGG, IGA, IGM: IgG (Immunoglobin G), Serum: 1810 mg/dL — ABNORMAL HIGH (ref 650–1600)

## 2011-12-13 ENCOUNTER — Encounter (HOSPITAL_COMMUNITY): Payer: Self-pay | Admitting: *Deleted

## 2011-12-13 ENCOUNTER — Emergency Department (HOSPITAL_COMMUNITY)
Admission: EM | Admit: 2011-12-13 | Discharge: 2011-12-13 | Disposition: A | Discharge status: Home / Self Care | Payer: Medicaid Other | Attending: Emergency Medicine | Admitting: Emergency Medicine

## 2011-12-13 DIAGNOSIS — M129 Arthropathy, unspecified: Secondary | ICD-10-CM | POA: Insufficient documentation

## 2011-12-13 DIAGNOSIS — D509 Iron deficiency anemia, unspecified: Secondary | ICD-10-CM | POA: Insufficient documentation

## 2011-12-13 DIAGNOSIS — R42 Dizziness and giddiness: Secondary | ICD-10-CM

## 2011-12-13 DIAGNOSIS — E785 Hyperlipidemia, unspecified: Secondary | ICD-10-CM | POA: Insufficient documentation

## 2011-12-13 DIAGNOSIS — I10 Essential (primary) hypertension: Secondary | ICD-10-CM | POA: Insufficient documentation

## 2011-12-13 DIAGNOSIS — C9000 Multiple myeloma not having achieved remission: Secondary | ICD-10-CM | POA: Insufficient documentation

## 2011-12-13 DIAGNOSIS — E119 Type 2 diabetes mellitus without complications: Secondary | ICD-10-CM | POA: Insufficient documentation

## 2011-12-13 LAB — CBC
HCT: 31.4 % — ABNORMAL LOW (ref 39.0–52.0)
Hemoglobin: 10.2 g/dL — ABNORMAL LOW (ref 13.0–17.0)
MCH: 24.2 pg — ABNORMAL LOW (ref 26.0–34.0)
MCHC: 32.5 g/dL (ref 30.0–36.0)
MCV: 74.4 fL — ABNORMAL LOW (ref 78.0–100.0)
Platelets: 221 10*3/uL (ref 150–400)
RBC: 4.22 MIL/uL (ref 4.22–5.81)
RDW: 16.1 % — ABNORMAL HIGH (ref 11.5–15.5)
WBC: 5.8 10*3/uL (ref 4.0–10.5)

## 2011-12-13 LAB — URINALYSIS, ROUTINE W REFLEX MICROSCOPIC
Bilirubin Urine: NEGATIVE
Glucose, UA: NEGATIVE mg/dL
Hgb urine dipstick: NEGATIVE
Ketones, ur: NEGATIVE mg/dL
Leukocytes, UA: NEGATIVE
Nitrite: NEGATIVE
Protein, ur: NEGATIVE mg/dL
Specific Gravity, Urine: 1.007 (ref 1.005–1.030)
Urobilinogen, UA: 0.2 mg/dL (ref 0.0–1.0)
pH: 6.5 (ref 5.0–8.0)

## 2011-12-13 LAB — BASIC METABOLIC PANEL
BUN: 15 mg/dL (ref 6–23)
CO2: 27 mEq/L (ref 19–32)
Calcium: 9.4 mg/dL (ref 8.4–10.5)
Chloride: 100 mEq/L (ref 96–112)
Creatinine, Ser: 1.79 mg/dL — ABNORMAL HIGH (ref 0.50–1.35)
GFR calc Af Amer: 45 mL/min — ABNORMAL LOW (ref >90)
GFR calc non Af Amer: 39 mL/min — ABNORMAL LOW (ref >90)
Glucose, Bld: 146 mg/dL — ABNORMAL HIGH (ref 70–99)
Potassium: 3.7 mEq/L (ref 3.5–5.1)
Sodium: 137 mEq/L (ref 135–145)

## 2011-12-13 LAB — TROPONIN I: Troponin I: <0.3 ng/mL (ref <0.30)

## 2011-12-13 LAB — GLUCOSE, CAPILLARY: Glucose-Capillary: 140 mg/dL — ABNORMAL HIGH (ref 70–99)

## 2011-12-13 MED ORDER — HEPARIN SOD (PORK) LOCK FLUSH 100 UNIT/ML IV SOLN
500.0000 [IU] | Freq: Once | INTRAVENOUS | Status: DC
  Filled 2011-12-13: qty 5

## 2011-12-13 MED ORDER — MECLIZINE HCL 50 MG PO TABS: 25.0000 mg | ORAL_TABLET | Freq: Four times a day (QID) | ORAL | Status: DC | PRN

## 2011-12-13 MED ORDER — DIAZEPAM 5 MG/ML IJ SOLN
5.0000 mg | Freq: Once | INTRAMUSCULAR | Status: AC
  Administered 2011-12-13: 5 mg via INTRAVENOUS
  Filled 2011-12-13: qty 2

## 2011-12-13 MED ORDER — DIAZEPAM 5 MG/ML IJ SOLN
2.5000 mg | Freq: Once | INTRAMUSCULAR | Status: AC
  Administered 2011-12-13: 2.5 mg via INTRAVENOUS
  Filled 2011-12-13: qty 2

## 2011-12-13 MED ORDER — ONDANSETRON HCL 4 MG/2ML IJ SOLN
4.0000 mg | Freq: Once | INTRAMUSCULAR | Status: AC
  Administered 2011-12-13: 4 mg via INTRAVENOUS
  Filled 2011-12-13: qty 2

## 2011-12-13 MED ORDER — MECLIZINE HCL 25 MG PO TABS
50.0000 mg | ORAL_TABLET | Freq: Once | ORAL | Status: AC
  Administered 2011-12-13: 50 mg via ORAL
  Filled 2011-12-13: qty 2

## 2011-12-13 MED ORDER — SODIUM CHLORIDE 0.9 % IV BOLUS (SEPSIS)
1000.0000 mL | Freq: Once | INTRAVENOUS | Status: AC
  Administered 2011-12-13: 1000 mL via INTRAVENOUS

## 2011-12-13 MED ORDER — HYDROMORPHONE HCL PF 1 MG/ML IJ SOLN
1.0000 mg | Freq: Once | INTRAMUSCULAR | Status: AC
  Administered 2011-12-13: 1 mg via INTRAVENOUS
  Filled 2011-12-13: qty 1

## 2011-12-13 MED ORDER — PROMETHAZINE HCL 25 MG/ML IJ SOLN
12.5000 mg | Freq: Once | INTRAMUSCULAR | Status: AC
  Administered 2011-12-13: 12.5 mg via INTRAVENOUS
  Filled 2011-12-13: qty 1

## 2011-12-13 NOTE — ED Notes (Signed)
Pt comes into ED with c/o of weakness and nausea/vomiting. Pt has hx of cancer, last chemo session 1 month ago. Pt reports "feeling weak for a few days". Pt began vomiting this morning x2. Pt reports hx of diabetes but has not checked his blood sugar in a few days.

## 2011-12-13 NOTE — ED Notes (Signed)
Pt has port - RN to access and get labs through that.

## 2011-12-13 NOTE — ED Provider Notes (Signed)
History    61yM with vertigo. Says dizziness but describes sensation of things moving around him. Denies acute pain anywhere. No tinnitus or ear fullness. No acute change in visual acuity. No confusion per family. Nausea. No SOB. No palpitations.  CSN: [id number]  Arrival date & time 12/13/11  [id number]   First MD Initiated Contact with Patient 12/13/11 0940      Chief Complaint  Patient presents with  . cancer pt - nausea     (Consider location/radiation/quality/duration/timing/severity/associated sxs/prior treatment) HPI  Past Medical History  Diagnosis Date  . Multiple myeloma(203.0)   . Hypertension   . Renal insufficiency   . Diverticulosis   . Hemorrhoids   . Type 2 diabetes mellitus   . Anxiety disorder   . Urinary retention   . Anemia   . Diabetic neuropathy   . Constipation   . Herpes zoster   . Arthritis   . Hyperlipidemia   . Myeloma 12/31/2010  . Arthralgia of multiple joints 12/31/2010  . Anemia, iron deficiency 03/12/2011  . 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 Knee arthroscopy     left  . Portacath placement   . Cataract extraction     bi-lateral  . Transurethral resection of prostate 01/2011    Dr. at [facility] in [place]  . Colonoscopy   . Esophagogastroduodenoscopy   . Colonoscopy 07/13/2011    Procedure: COLONOSCOPY;  Surgeon: Ladene Artist, MD,FACG;  Location: [facility];  Service: Endoscopy;  Laterality: N/A;    Family History  Problem Relation Age of Onset  . Breast cancer Sister   . Breast cancer Mother   . Diabetes Sister   . Bone cancer Father   . Colon cancer Neg Hx   . Heart disease Sister   . Heart disease Mother   . Heart disease Daughter   . Throat cancer Brother   . Stomach cancer Brother     History  Substance Use Topics  . Smoking status: Never Smoker   . Smokeless tobacco: Never Used  . Alcohol Use: No      Review of Systems   Review of symptoms negative  unless otherwise noted in HPI.   Allergies  Iodine  Home Medications   Current Outpatient Rx  Name Route Sig Dispense Refill  . AMLODIPINE BESYLATE 10 MG PO TABS Oral Take 10 mg by mouth daily.      . ASPIRIN 81 MG PO TABS Oral Take 81 mg by mouth daily.    Marland Kitchen CARAFATE 1 GM/10ML PO SUSP  2 (two) times daily.     . ERGOCALCIFEROL 50000 UNITS PO CAPS Oral Take 50,000 Units by mouth once a week.      . FUROSEMIDE 20 MG PO TABS Oral Take 20 mg by mouth daily.      Marland Kitchen GLIPIZIDE 5 MG PO TABS Oral Take 5 mg by mouth daily.     Marland Kitchen HYDRALAZINE HCL 25 MG PO TABS Oral Take 25 mg by mouth 3 (three) times daily.    Marland Kitchen NATURAL BALANCE TEARS OP Ophthalmic Apply to eye every morning.    Marland Kitchen METHOCARBAMOL 500 MG PO TABS Oral Take 500 mg by mouth 4 (four) times daily.     . OXYCODONE HCL ER 80 MG PO TB12 Oral Take 80 mg by mouth 3 (three) times daily.    . OXYCODONE-ACETAMINOPHEN 10-325 MG PO TABS Oral Take 1 tablet by mouth every 4 (four) hours as  needed. Pain    . PRESCRIPTION MEDICATION  Pt gets pamidronate at [facility]. Pt's next treatment is on 01-13-12.    Orlie Dakin SODIUM 8.6-50 MG PO TABS Oral Take 1 tablet by mouth daily.    Marland Kitchen TAMSULOSIN HCL 0.4 MG PO CAPS Oral Take 0.4 mg by mouth daily.     . TRIAMTERENE-HCTZ 37.5-25 MG PO TABS Oral Take 1 tablet by mouth daily.      Marland Kitchen POTASSIUM CHLORIDE CRYS ER 20 MEQ PO TBCR Oral Take 20 mEq by mouth 2 (two) times daily.      Pulse 69  Temp 98.2 F (36.8 C) (Oral)  Resp 17  SpO2 100%  Physical Exam  Nursing note and vitals reviewed. Constitutional: He is oriented to person, place, and time. He appears well-developed and well-nourished. No distress.  HENT:  Head: Normocephalic and atraumatic.  Right Ear: External ear normal.  Left Ear: External ear normal.       TMs and ext aud canals clear b/l  Eyes: Conjunctivae normal are normal. Right eye exhibits no discharge. Left eye exhibits no discharge.  Neck: Neck supple.    Cardiovascular: Normal rate, regular rhythm and normal heart sounds.  Exam reveals no gallop and no friction rub.   No murmur heard. Pulmonary/Chest: Effort normal and breath sounds normal. No respiratory distress. He exhibits no tenderness.  Abdominal: Soft. He exhibits no distension. There is no tenderness.  Musculoskeletal: He exhibits no edema and no tenderness.  Neurological: He is alert and oriented to person, place, and time. No cranial nerve deficit. He exhibits normal muscle tone. Coordination normal.       Good finger to nose b/l  Skin: Skin is warm. He is not diaphoretic.  Psychiatric: He has a normal mood and affect. His behavior is normal. Judgment and thought content normal.    ED Course  Procedures (including critical care time)  Labs Reviewed  GLUCOSE, CAPILLARY - Abnormal; Notable for the following:    Glucose-Capillary 140 (*)     All other components within normal limits  CBC - Abnormal; Notable for the following:    Hemoglobin 10.2 (*)     HCT 31.4 (*)     MCV 74.4 (*)     MCH 24.2 (*)     RDW 16.1 (*)     All other components within normal limits  BASIC METABOLIC PANEL - Abnormal; Notable for the following:    Glucose, Bld 146 (*)     Creatinine, Ser 1.79 (*)     GFR calc non Af Amer 39 (*)     GFR calc Af Amer 45 (*)     All other components within normal limits  URINALYSIS, ROUTINE W REFLEX MICROSCOPIC  TROPONIN I  LAB REPORT - SCANNED   No results found.  EKG:  Rhythm: normal sinus Vent. rate 60 BPM PR interval 176 ms QRS duration 78 ms QT/QTc 424/424 ms Axis: normal ST segments: normal   1. Vertigo       MDM  61 year old male with dizziness. Patient is actually describing vertigo. Suspect peripheral etiology. Patient has a nonfocal neurological examination. Symptoms improved with meds. Central cause considered but doubt. EKG with normal intervals. Plan continue symptomatic treatment. Return precautions were discussed. Outpatient followup  otherwise.        Virgel Manifold, MD 12/18/11 1009

## 2011-12-13 NOTE — ED Notes (Signed)
Discharge instructions reviewed w/ pt., verbalizes understanding. One prescription provided at discharge.

## 2011-12-13 NOTE — ED Notes (Signed)
Pt unable to urinate at this time.  

## 2012-01-14 ENCOUNTER — Other Ambulatory Visit (HOSPITAL_BASED_OUTPATIENT_CLINIC_OR_DEPARTMENT_OTHER): Payer: Medicaid Other | Admitting: Lab

## 2012-01-14 ENCOUNTER — Ambulatory Visit (HOSPITAL_BASED_OUTPATIENT_CLINIC_OR_DEPARTMENT_OTHER): Payer: Medicaid Other

## 2012-01-14 ENCOUNTER — Ambulatory Visit (HOSPITAL_BASED_OUTPATIENT_CLINIC_OR_DEPARTMENT_OTHER): Payer: Medicaid Other | Admitting: Hematology & Oncology

## 2012-01-14 VITALS — BP 132/66 | HR 89 | Temp 98.6°F | Resp 18 | Ht 69.0 in | Wt 191.0 lb

## 2012-01-14 DIAGNOSIS — D649 Anemia, unspecified: Secondary | ICD-10-CM

## 2012-01-14 DIAGNOSIS — C9000 Multiple myeloma not having achieved remission: Secondary | ICD-10-CM

## 2012-01-14 DIAGNOSIS — E291 Testicular hypofunction: Secondary | ICD-10-CM

## 2012-01-14 DIAGNOSIS — N289 Disorder of kidney and ureter, unspecified: Secondary | ICD-10-CM

## 2012-01-14 DIAGNOSIS — D631 Anemia in chronic kidney disease: Secondary | ICD-10-CM

## 2012-01-14 DIAGNOSIS — D509 Iron deficiency anemia, unspecified: Secondary | ICD-10-CM

## 2012-01-14 LAB — CBC WITH DIFFERENTIAL (CANCER CENTER ONLY)
BASO%: 0.4 % (ref 0.0–2.0)
LYMPH%: 21.2 % (ref 14.0–48.0)
MCH: 24.6 pg — ABNORMAL LOW (ref 28.0–33.4)
MCV: 75 fL — ABNORMAL LOW (ref 82–98)
MONO%: 8 % (ref 0.0–13.0)
Platelets: 216 10*3/uL (ref 145–400)
RDW: 17.5 % — ABNORMAL HIGH (ref 11.1–15.7)
WBC: 9.7 10*3/uL (ref 4.0–10.0)

## 2012-01-14 MED ORDER — SODIUM CHLORIDE 0.9 % IV SOLN
Freq: Once | INTRAVENOUS | Status: AC
  Administered 2012-01-14: 10:00:00 via INTRAVENOUS

## 2012-01-14 MED ORDER — SODIUM CHLORIDE 0.9 % IV SOLN
60.0000 mg | Freq: Once | INTRAVENOUS | Status: AC
  Administered 2012-01-14: 60 mg via INTRAVENOUS
  Filled 2012-01-14: qty 500

## 2012-01-14 MED ORDER — SODIUM CHLORIDE 0.9 % IJ SOLN
10.0000 mL | Freq: Once | INTRAMUSCULAR | Status: AC
  Administered 2012-01-14: 10 mL via INTRAVENOUS
  Filled 2012-01-14: qty 10

## 2012-01-14 MED ORDER — HEPARIN SOD (PORK) LOCK FLUSH 100 UNIT/ML IV SOLN
500.0000 [IU] | Freq: Once | INTRAVENOUS | Status: AC
  Administered 2012-01-14: 500 [IU] via INTRAVENOUS
  Filled 2012-01-14: qty 5

## 2012-01-14 MED ORDER — DARBEPOETIN ALFA-POLYSORBATE 300 MCG/0.6ML IJ SOLN
300.0000 ug | Freq: Once | INTRAMUSCULAR | Status: AC
  Administered 2012-01-14: 300 ug via SUBCUTANEOUS

## 2012-01-14 NOTE — Progress Notes (Signed)
This office note has been dictated.

## 2012-01-14 NOTE — Patient Instructions (Addendum)
Pamidronate injection What is this medicine? PAMIDRONATE (pa mi DROE nate) slows calcium loss from bones. It is used to treat high calcium blood levels from cancer or Paget's disease. It is also used to treat bone pain and prevent fractures from certain cancers that have spread to the bone. This medicine may be used for other purposes; ask your health care provider or pharmacist if you have questions. What should I tell my health care provider before I take this medicine? They need to know if you have any of these conditions: -aspirin-sensitive asthma -dental disease -kidney disease -an unusual or allergic reaction to pamidronate, other medicines, foods, dyes, or preservatives -pregnant or trying to get pregnant -breast-feeding How should I use this medicine? This medicine is for infusion into a vein. It is given by a health care professional in a hospital or clinic setting. Talk to your pediatrician regarding the use of this medicine in children. This medicine is not approved for use in children. Overdosage: If you think you have taken too much of this medicine contact a poison control center or emergency room at once. NOTE: This medicine is only for you. Do not share this medicine with others. What if I miss a dose? This does not apply. What may interact with this medicine? -certain antibiotics given by injection -medicines for inflammation or pain like ibuprofen, naproxen -some diuretics like bumetanide, furosemide -cyclosporine -parathyroid hormone -tacrolimus -teriparatide -thalidomide This list may not describe all possible interactions. Give your health care provider a list of all the medicines, herbs, non-prescription drugs, or dietary supplements you use. Also tell them if you smoke, drink alcohol, or use illegal drugs. Some items may interact with your medicine. What should I watch for while using this medicine? Visit your doctor or health care professional for regular  checkups. It may be some time before you see the benefit from this medicine. Do not stop taking your medicine unless your doctor tells you to. Your doctor may order blood tests or other tests to see how you are doing. Women should inform their doctor if they wish to become pregnant or think they might be pregnant. There is a potential for serious side effects to an unborn child. Talk to your health care professional or pharmacist for more information. You should make sure that you get enough calcium and vitamin D while you are taking this medicine. Discuss the foods you eat and the vitamins you take with your health care professional. Some people who take this medicine have severe bone, joint, and/or muscle pain. This medicine may also increase your risk for a broken thigh bone. Tell your doctor right away if you have pain in your upper leg or groin. Tell your doctor if you have any pain that does not go away or that gets worse. What side effects may I notice from receiving this medicine? Side effects that you should report to your doctor or health care professional as soon as possible: -allergic reactions like skin rash, itching or hives, swelling of the face, lips, or tongue -black or tarry stools -changes in vision -eye inflammation, pain -high blood pressure -jaw pain, especially burning or cramping -muscle weakness -numb, tingling pain -swelling of feet or hands -trouble passing urine or change in the amount of urine -unable to move easily Side effects that usually do not require medical attention (report to your doctor or health care professional if they continue or are bothersome): -bone, joint, or muscle pain -constipation -dizzy, drowsy -fever -headache -loss of  appetite -nausea, vomiting -pain at site where injected This list may not describe all possible side effects. Call your doctor for medical advice about side effects. You may report side effects to FDA at  1-800-FDA-1088. Where should I keep my medicine? This drug is given in a hospital or clinic and will not be stored at home. NOTE: This sheet is a summary. It may not cover all possible information. If you have questions about this medicine, talk to your doctor, pharmacist, or health care provider.  2012, [employer]. (08/08/2010 8:49:49 AM)darDarbepoetin Alfa injection What is this medicine? DARBEPOETIN ALFA (dar be POE e tin AL fa) helps your body make more red blood cells. It is used to treat anemia caused by chronic kidney failure and chemotherapy. This medicine may be used for other purposes; ask your health care provider or pharmacist if you have questions. What should I tell my health care provider before I take this medicine? They need to know if you have any of these conditions: -blood clotting disorders or history of blood clots -cancer patient not on chemotherapy -cystic fibrosis -heart disease, such as angina, heart failure, or a history of a heart attack -hemoglobin level of 12 g/dL or greater -high blood pressure -low levels of folate, iron, or vitamin B12 -seizures -an unusual or allergic reaction to darbepoetin, erythropoietin, albumin, hamster proteins, latex, other medicines, foods, dyes, or preservatives -pregnant or trying to get pregnant -breast-feeding How should I use this medicine? This medicine is for injection into a vein or under the skin. It is usually given by a health care professional in a hospital or clinic setting. If you get this medicine at home, you will be taught how to prepare and give this medicine. Do not shake the solution before you withdraw a dose. Use exactly as directed. Take your medicine at regular intervals. Do not take your medicine more often than directed. It is important that you put your used needles and syringes in a special sharps container. Do not put them in a trash can. If you do not have a sharps container, call your  pharmacist or healthcare provider to get one. Talk to your pediatrician regarding the use of this medicine in children. While this medicine may be used in children as young as 1 year for selected conditions, precautions do apply. Overdosage: If you think you have taken too much of this medicine contact a poison control center or emergency room at once. NOTE: This medicine is only for you. Do not share this medicine with others. What if I miss a dose? If you miss a dose, take it as soon as you can. If it is almost time for your next dose, take only that dose. Do not take double or extra doses. What may interact with this medicine? Do not take this medicine with any of the following medications: -epoetin alfa This list may not describe all possible interactions. Give your health care provider a list of all the medicines, herbs, non-prescription drugs, or dietary supplements you use. Also tell them if you smoke, drink alcohol, or use illegal drugs. Some items may interact with your medicine. What should I watch for while using this medicine? Visit your prescriber or health care professional for regular checks on your progress and for the needed blood tests and blood pressure measurements. It is especially important for the doctor to make sure your hemoglobin level is in the desired range, to limit the risk of potential side effects and to give you  the best benefit. Keep all appointments for any recommended tests. Check your blood pressure as directed. Ask your doctor what your blood pressure should be and when you should contact him or her. As your body makes more red blood cells, you may need to take iron, folic acid, or vitamin B supplements. Ask your doctor or health care provider which products are right for you. If you have kidney disease continue dietary restrictions, even though this medication can make you feel better. Talk with your doctor or health care professional about the foods you eat and the  vitamins that you take. What side effects may I notice from receiving this medicine? Side effects that you should report to your doctor or health care professional as soon as possible: -allergic reactions like skin rash, itching or hives, swelling of the face, lips, or tongue -breathing problems -changes in vision -chest pain -confusion, trouble speaking or understanding -feeling faint or lightheaded, falls -high blood pressure -muscle aches or pains -pain, swelling, warmth in the leg -rapid weight gain -severe headaches -sudden numbness or weakness of the face, arm or leg -trouble walking, dizziness, loss of balance or coordination -seizures (convulsions) -swelling of the ankles, feet, hands -unusually weak or tired Side effects that usually do not require medical attention (report to your doctor or health care professional if they continue or are bothersome): -diarrhea -fever, chills (flu-like symptoms) -headaches -nausea, vomiting -redness, stinging, or swelling at site where injected This list may not describe all possible side effects. Call your doctor for medical advice about side effects. You may report side effects to FDA at 1-800-FDA-1088. Where should I keep my medicine? Keep out of the reach of children. Store in a refrigerator between 2 and 8 degrees C (36 and 46 degrees F). Do not freeze. Do not shake. Throw away any unused portion if using a single-dose vial. Throw away any unused medicine after the expiration date. NOTE: This sheet is a summary. It may not cover all possible information. If you have questions about this medicine, talk to your doctor, pharmacist, or health care provider.  2012, [employer]. (01/24/2008 10:23:57 AM)

## 2012-01-15 NOTE — Progress Notes (Signed)
CC:   Gala Romney, MD, Fax [phone]  DIAGNOSES: 1. IgG kappa myeloma. 2. Anemia secondary to renal insufficiency. 3. Severe fibromyalgia. 4. Insulin-dependent diabetes.  CURRENT THERAPY: 1. Aredia 60 mg IV q.2 months. 2. Aranesp 300 mcg subcu as needed for hemoglobin less than 11. 3. IV iron as indicated.  INTERIM HISTORY:  Mr. Dobin comes in for his followup.  Not surprisingly, the weather has caused his bones to hurt quite a bit. This has been a longstanding issue for him during this time a year.  When we last saw him, his ferritin was 1556.  Iron saturation was 35%.  We have not had to give him Aranesp for quite a while.  I do not think he has gotten Aranesp probably for a good 4 months.  He last got iron, I think, back in January of this year.  His myeloma really has not been an issue for [place].  He has not had a monoclonal spike. When we last checked in September, his IgG level was 1810 mg/dL.  Kappa light chain was 2.52 mg/dL.  He did not have a monoclonal spike in his serum.  PHYSICAL EXAMINATION:  General:  This is a well-developed, well- nourished black gentleman in no obvious distress.  Vital signs: Temperature of 98.6, pulse 89, respiratory rate 18, blood pressure 132/66.  Weight is 191.  Head and neck:  Normocephalic, atraumatic skull.  There are no ocular or oral lesions.  There are no palpable cervical or supraclavicular lymph nodes.  Lungs:  Clear bilaterally. Cardiac:  Regular rate and rhythm with a normal S1 and S2.  There are no murmurs, rubs, or bruits.  Abdomen:  Soft with good bowel sounds.  There is no palpable abdominal mass.  There is no palpable hepatosplenomegaly. Extremities:  No clubbing, cyanosis, or edema.  Does have tenderness of his arms and legs.  Skin:  No rashes, ecchymosis, or petechia.  LABORATORY STUDIES:  White cell count is 9.7, hemoglobin 10, hematocrit 30.5, platelet count is 216.  MCV is 75.  IMPRESSION:  Mr. Osbey is a  61 year old gentleman with IgG kappa myeloma.  At best, this is smoldering myeloma.  We do not even see a monoclonal spike in his serum.  We will go ahead and give him Aranesp today.  His hemoglobin is down. Again, it has been 4 months since he had any Aranesp.  We will go ahead with Aredia today.  We will plan to get him back in 2 more months.    ______________________________ Volanda Napoleon, M.D. PRE/MEDQ  D:  01/14/2012  T:  01/15/2012  Job:  [id number]

## 2012-01-17 DIAGNOSIS — R1310 Dysphagia, unspecified: Secondary | ICD-10-CM | POA: Insufficient documentation

## 2012-01-17 DIAGNOSIS — IMO0001 Reserved for inherently not codable concepts without codable children: Secondary | ICD-10-CM | POA: Insufficient documentation

## 2012-01-19 LAB — PROTEIN ELECTROPHORESIS, SERUM, WITH REFLEX
Albumin ELP: 52.3 % — ABNORMAL LOW (ref 55.8–66.1)
Beta 2: 6.1 % (ref 3.2–6.5)
Beta Globulin: 5.7 % (ref 4.7–7.2)

## 2012-01-19 LAB — COMPREHENSIVE METABOLIC PANEL
ALT: 12 U/L (ref 0–53)
AST: 17 U/L (ref 0–37)
Alkaline Phosphatase: 67 U/L (ref 39–117)
Sodium: 139 mEq/L (ref 135–145)
Total Bilirubin: 0.3 mg/dL (ref 0.3–1.2)
Total Protein: 7.9 g/dL (ref 6.0–8.3)

## 2012-01-19 LAB — IRON AND TIBC
TIBC: 247 ug/dL (ref 215–435)
UIBC: 178 ug/dL (ref 125–400)

## 2012-01-19 LAB — IGG, IGA, IGM
IgA: 404 mg/dL — ABNORMAL HIGH (ref 68–379)
IgM, Serum: 70 mg/dL (ref 41–251)

## 2012-01-19 LAB — RETICULOCYTES (CHCC): RBC.: 4.25 MIL/uL (ref 4.22–5.81)

## 2012-01-20 ENCOUNTER — Other Ambulatory Visit: Payer: Self-pay | Admitting: *Deleted

## 2012-01-20 ENCOUNTER — Telehealth: Payer: Self-pay | Admitting: *Deleted

## 2012-01-20 DIAGNOSIS — D649 Anemia, unspecified: Secondary | ICD-10-CM

## 2012-01-20 DIAGNOSIS — D509 Iron deficiency anemia, unspecified: Secondary | ICD-10-CM

## 2012-01-20 NOTE — Telephone Encounter (Addendum)
Message copied by Orlando Penner on Wed Jan 20, 2012 12:58 PM ------      Message from: Volanda Napoleon      Created: Tue Jan 19, 2012  9:14 AM       Call - iron is low.  Need Feraheme at 1020mg  x 1 dose - please set up in 1-2 weeks.  Laurey Arrow This message given to pt then transferred to scheduler to set up date.

## 2012-01-27 ENCOUNTER — Ambulatory Visit (HOSPITAL_BASED_OUTPATIENT_CLINIC_OR_DEPARTMENT_OTHER): Payer: Medicaid Other

## 2012-01-27 VITALS — BP 146/83 | HR 83 | Temp 99.0°F | Resp 16

## 2012-01-27 DIAGNOSIS — D649 Anemia, unspecified: Secondary | ICD-10-CM

## 2012-01-27 DIAGNOSIS — D509 Iron deficiency anemia, unspecified: Secondary | ICD-10-CM

## 2012-01-27 MED ORDER — SODIUM CHLORIDE 0.9 % IV SOLN: 1020.0000 mg | Freq: Once | INTRAVENOUS | Status: DC

## 2012-01-27 MED ORDER — SODIUM CHLORIDE 0.9 % IV SOLN
1020.0000 mg | Freq: Once | INTRAVENOUS | Status: AC
  Administered 2012-01-27: 1020 mg via INTRAVENOUS
  Filled 2012-01-27: qty 34

## 2012-01-27 MED ORDER — HEPARIN SOD (PORK) LOCK FLUSH 100 UNIT/ML IV SOLN
500.0000 [IU] | Freq: Once | INTRAVENOUS | Status: AC
  Administered 2012-01-27: 500 [IU] via INTRAVENOUS
  Filled 2012-01-27: qty 5

## 2012-01-27 MED ORDER — SODIUM CHLORIDE 0.9 % IV SOLN
INTRAVENOUS | Status: DC
  Administered 2012-01-27: 14:00:00 via INTRAVENOUS

## 2012-01-27 MED ORDER — SODIUM CHLORIDE 0.9 % IJ SOLN
10.0000 mL | INTRAMUSCULAR | Status: DC | PRN
  Administered 2012-01-27: 10 mL via INTRAVENOUS
  Filled 2012-01-27: qty 10

## 2012-01-27 NOTE — Patient Instructions (Signed)
Ferumoxytol injection What is this medicine? FERUMOXYTOL is an iron complex. Iron is used to make healthy red blood cells, which carry oxygen and nutrients throughout the body. This medicine is used to treat iron deficiency anemia in people with chronic kidney disease. This medicine may be used for other purposes; ask your health care provider or pharmacist if you have questions. What should I tell my health care provider before I take this medicine? They need to know if you have any of these conditions: -anemia not caused by low iron levels -high levels of iron in the blood -magnetic resonance imaging (MRI) test scheduled -an unusual or allergic reaction to iron, other medicines, foods, dyes, or preservatives -pregnant or trying to get pregnant -breast-feeding How should I use this medicine? This medicine is for infusion into a vein. It is given by a health care professional in a hospital or clinic setting. Talk to your pediatrician regarding the use of this medicine in children. Special care may be needed. Overdosage: If you think you've taken too much of this medicine contact a poison control center or emergency room at once. Overdosage: If you think you have taken too much of this medicine contact a poison control center or emergency room at once. NOTE: This medicine is only for you. Do not share this medicine with others. What if I miss a dose? It is important not to miss your dose. Call your doctor or health care professional if you are unable to keep an appointment. What may interact with this medicine? This medicine may interact with the following medications: -other iron products This list may not describe all possible interactions. Give your health care provider a list of all the medicines, herbs, non-prescription drugs, or dietary supplements you use. Also tell them if you smoke, drink alcohol, or use illegal drugs. Some items may interact with your medicine. What should I watch  for while using this medicine? Visit your doctor or healthcare professional regularly. Tell your doctor or healthcare professional if your symptoms do not start to get better or if they get worse. You may need blood work done while you are taking this medicine. You may need to follow a special diet. Talk to your doctor. Foods that contain iron include: whole grains/cereals, dried fruits, beans, or peas, leafy green vegetables, and organ meats (liver, kidney). What side effects may I notice from receiving this medicine? Side effects that you should report to your doctor or health care professional as soon as possible: -allergic reactions like skin rash, itching or hives, swelling of the face, lips, or tongue -breathing problems -changes in blood pressure -feeling faint or lightheaded, falls -fever or chills -flushing, sweating, or hot feelings -swelling of the ankles or feet Side effects that usually do not require medical attention (Report these to your doctor or health care professional if they continue or are bothersome.): -diarrhea -headache -nausea, vomiting -stomach pain This list may not describe all possible side effects. Call your doctor for medical advice about side effects. You may report side effects to FDA at 1-800-FDA-1088. Where should I keep my medicine? This drug is given in a hospital or clinic and will not be stored at home. NOTE: This sheet is a summary. It may not cover all possible information. If you have questions about this medicine, talk to your doctor, pharmacist, or health care provider.  2013, [employer]. (11/02/2007 9:48:25 PM)  

## 2012-03-03 ENCOUNTER — Ambulatory Visit
Admission: RE | Admit: 2012-03-03 | Discharge: 2012-03-03 | Disposition: A | Discharge status: Home / Self Care | Payer: Medicaid Other | Source: Ambulatory Visit | Attending: Internal Medicine | Admitting: Internal Medicine

## 2012-03-03 ENCOUNTER — Other Ambulatory Visit: Payer: Self-pay | Admitting: Internal Medicine

## 2012-03-03 DIAGNOSIS — M549 Dorsalgia, unspecified: Secondary | ICD-10-CM

## 2012-03-17 ENCOUNTER — Encounter: Payer: Self-pay | Admitting: Hematology & Oncology

## 2012-03-17 ENCOUNTER — Ambulatory Visit (HOSPITAL_BASED_OUTPATIENT_CLINIC_OR_DEPARTMENT_OTHER): Payer: Medicaid Other

## 2012-03-17 ENCOUNTER — Other Ambulatory Visit: Payer: Medicaid Other | Admitting: Lab

## 2012-03-17 ENCOUNTER — Ambulatory Visit (HOSPITAL_BASED_OUTPATIENT_CLINIC_OR_DEPARTMENT_OTHER): Payer: Medicaid Other | Admitting: Hematology & Oncology

## 2012-03-17 VITALS — BP 146/67 | HR 80 | Temp 97.7°F | Resp 18 | Ht 69.0 in | Wt 200.0 lb

## 2012-03-17 DIAGNOSIS — C9000 Multiple myeloma not having achieved remission: Secondary | ICD-10-CM

## 2012-03-17 DIAGNOSIS — E349 Endocrine disorder, unspecified: Secondary | ICD-10-CM

## 2012-03-17 DIAGNOSIS — E291 Testicular hypofunction: Secondary | ICD-10-CM

## 2012-03-17 DIAGNOSIS — D509 Iron deficiency anemia, unspecified: Secondary | ICD-10-CM

## 2012-03-17 HISTORY — DX: Endocrine disorder, unspecified: E34.9

## 2012-03-17 LAB — CBC WITH DIFFERENTIAL (CANCER CENTER ONLY)
BASO#: 0 10*3/uL (ref 0.0–0.2)
EOS%: 3.7 % (ref 0.0–7.0)
HCT: 35.5 % — ABNORMAL LOW (ref 38.7–49.9)
HGB: 11.5 g/dL — ABNORMAL LOW (ref 13.0–17.1)
LYMPH#: 2.7 10*3/uL (ref 0.9–3.3)
MCHC: 32.4 g/dL (ref 32.0–35.9)
MONO#: 0.8 10*3/uL (ref 0.1–0.9)
NEUT#: 4.2 10*3/uL (ref 1.5–6.5)
NEUT%: 52.5 % (ref 40.0–80.0)
RBC: 4.53 10*6/uL (ref 4.20–5.70)
WBC: 8.1 10*3/uL (ref 4.0–10.0)

## 2012-03-17 MED ORDER — SODIUM CHLORIDE 0.9 % IV SOLN
60.0000 mg | Freq: Once | INTRAVENOUS | Status: AC
  Administered 2012-03-17: 60 mg via INTRAVENOUS
  Filled 2012-03-17: qty 20

## 2012-03-17 MED ORDER — DARBEPOETIN ALFA-POLYSORBATE 300 MCG/0.6ML IJ SOLN: 300.0000 ug | Freq: Once | INTRAMUSCULAR | Status: DC

## 2012-03-17 MED ORDER — SODIUM CHLORIDE 0.9 % IJ SOLN
10.0000 mL | INTRAMUSCULAR | Status: DC | PRN
  Administered 2012-03-17: 10 mL via INTRAVENOUS
  Filled 2012-03-17: qty 10

## 2012-03-17 MED ORDER — SODIUM CHLORIDE 0.9 % IV SOLN
Freq: Once | INTRAVENOUS | Status: AC
  Administered 2012-03-17: 09:00:00 via INTRAVENOUS

## 2012-03-17 MED ORDER — TESTOSTERONE CYPIONATE 200 MG/ML IM SOLN
200.0000 mg | INTRAMUSCULAR | Status: DC
  Administered 2012-03-17: 200 mg via INTRAMUSCULAR

## 2012-03-17 MED ORDER — HEPARIN SOD (PORK) LOCK FLUSH 100 UNIT/ML IV SOLN
500.0000 [IU] | Freq: Once | INTRAVENOUS | Status: AC
  Administered 2012-03-17: 500 [IU] via INTRAVENOUS
  Filled 2012-03-17: qty 5

## 2012-03-17 NOTE — Patient Instructions (Signed)
Pamidronate injection What is this medicine? PAMIDRONATE (pa mi DROE nate) slows calcium loss from bones. It is used to treat high calcium blood levels from cancer or Paget's disease. It is also used to treat bone pain and prevent fractures from certain cancers that have spread to the bone. This medicine may be used for other purposes; ask your health care provider or pharmacist if you have questions. What should I tell my health care provider before I take this medicine? They need to know if you have any of these conditions: -aspirin-sensitive asthma -dental disease -kidney disease -an unusual or allergic reaction to pamidronate, other medicines, foods, dyes, or preservatives -pregnant or trying to get pregnant -breast-feeding How should I use this medicine? This medicine is for infusion into a vein. It is given by a health care professional in a hospital or clinic setting. Talk to your pediatrician regarding the use of this medicine in children. This medicine is not approved for use in children. Overdosage: If you think you have taken too much of this medicine contact a poison control center or emergency room at once. NOTE: This medicine is only for you. Do not share this medicine with others. What if I miss a dose? This does not apply. What may interact with this medicine? -certain antibiotics given by injection -medicines for inflammation or pain like ibuprofen, naproxen -some diuretics like bumetanide, furosemide -cyclosporine -parathyroid hormone -tacrolimus -teriparatide -thalidomide This list may not describe all possible interactions. Give your health care provider a list of all the medicines, herbs, non-prescription drugs, or dietary supplements you use. Also tell them if you smoke, drink alcohol, or use illegal drugs. Some items may interact with your medicine. What should I watch for while using this medicine? Visit your doctor or health care professional for regular  checkups. It may be some time before you see the benefit from this medicine. Do not stop taking your medicine unless your doctor tells you to. Your doctor may order blood tests or other tests to see how you are doing. Women should inform their doctor if they wish to become pregnant or think they might be pregnant. There is a potential for serious side effects to an unborn child. Talk to your health care professional or pharmacist for more information. You should make sure that you get enough calcium and vitamin D while you are taking this medicine. Discuss the foods you eat and the vitamins you take with your health care professional. Some people who take this medicine have severe bone, joint, and/or muscle pain. This medicine may also increase your risk for a broken thigh bone. Tell your doctor right away if you have pain in your upper leg or groin. Tell your doctor if you have any pain that does not go away or that gets worse. What side effects may I notice from receiving this medicine? Side effects that you should report to your doctor or health care professional as soon as possible: -allergic reactions like skin rash, itching or hives, swelling of the face, lips, or tongue -black or tarry stools -changes in vision -eye inflammation, pain -high blood pressure -jaw pain, especially burning or cramping -muscle weakness -numb, tingling pain -swelling of feet or hands -trouble passing urine or change in the amount of urine -unable to move easily Side effects that usually do not require medical attention (report to your doctor or health care professional if they continue or are bothersome): -bone, joint, or muscle pain -constipation -dizzy, drowsy -fever -headache -loss of  appetite -nausea, vomiting -pain at site where injected This list may not describe all possible side effects. Call your doctor for medical advice about side effects. You may report side effects to FDA at  1-800-FDA-1088. Where should I keep my medicine? This drug is given in a hospital or clinic and will not be stored at home. NOTE: This sheet is a summary. It may not cover all possible information. If you have questions about this medicine, talk to your doctor, pharmacist, or health care provider.  2012, [employer] Standard. (08/08/2010 8:49:49 AM)Testosterone injection What is this medicine? TESTOSTERONE (tes TOS ter one) is the main male hormone. It supports normal male development such as muscle growth, facial hair, and deep voice. It is used in males to treat low testosterone levels. This medicine may be used for other purposes; ask your health care provider or pharmacist if you have questions. What should I tell my health care provider before I take this medicine? They need to know if you have any of these conditions: -breast cancer -diabetes -heart disease -kidney disease -liver disease -lung disease -prostate cancer, enlargement -an unusual or allergic reaction to testosterone, other medicines, foods, dyes, or preservatives -pregnant or trying to get pregnant -breast-feeding How should I use this medicine? This medicine is for injection into a muscle. It is usually given by a health care professional in a hospital or clinic setting. Contact your pediatrician regarding the use of this medicine in children. While this medicine may be prescribed for children as young as 26 years of age for selected conditions, precautions do apply. Overdosage: If you think you have taken too much of this medicine contact a poison control center or emergency room at once. NOTE: This medicine is only for you. Do not share this medicine with others. What if I miss a dose? Try not to miss a dose. Your doctor or health care professional will tell you when your next injection is due. Notify the office if you are unable to keep an appointment. What may interact with this medicine? -medicines for  diabetes -medicines that treat or prevent blood clots like warfarin -oxyphenbutazone -propranolol -steroid medicines like prednisone or cortisone This list may not describe all possible interactions. Give your health care provider a list of all the medicines, herbs, non-prescription drugs, or dietary supplements you use. Also tell them if you smoke, drink alcohol, or use illegal drugs. Some items may interact with your medicine. What should I watch for while using this medicine? Visit your doctor or health care professional for regular checks on your progress. They will need to check the level of testosterone in your blood. This medicine may affect blood sugar levels. If you have diabetes, check with your doctor or health care professional before you change your diet or the dose of your diabetic medicine. This drug is banned from use in athletes by most athletic organizations. What side effects may I notice from receiving this medicine? Side effects that you should report to your doctor or health care professional as soon as possible: -allergic reactions like skin rash, itching or hives, swelling of the face, lips, or tongue -breast enlargement -breathing problems -changes in mood, especially anger, depression, or rage -dark urine -general ill feeling or flu-like symptoms -light-colored stools -loss of appetite, nausea -nausea, vomiting -right upper belly pain -stomach pain -swelling of ankles -too frequent or persistent erections -trouble passing urine or change in the amount of urine -unusually weak or tired -yellowing of the eyes or skin Additional  side effects that can occur in women include: -deep or hoarse voice -facial hair growth -irregular menstrual periods Side effects that usually do not require medical attention (report to your doctor or health care professional if they continue or are bothersome): -acne -change in sex drive or performance -hair loss -headache This  list may not describe all possible side effects. Call your doctor for medical advice about side effects. You may report side effects to FDA at 1-800-FDA-1088. Where should I keep my medicine? Keep out of the reach of children. This medicine can be abused. Keep your medicine in a safe place to protect it from theft. Do not share this medicine with anyone. Selling or giving away this medicine is dangerous and against the law. Store at room temperature between 20 and 25 degrees C (68 and 77 degrees F). Do not freeze. Protect from light. Follow the directions for the product you are prescribed. Throw away any unused medicine after the expiration date. NOTE: This sheet is a summary. It may not cover all possible information. If you have questions about this medicine, talk to your doctor, pharmacist, or health care provider.  2012, [employer] Standard. (04/22/2007 4:13:46 PM)

## 2012-03-17 NOTE — Progress Notes (Signed)
This office note has been dictated.

## 2012-03-21 LAB — COMPREHENSIVE METABOLIC PANEL
ALT: 17 U/L (ref 0–53)
AST: 18 U/L (ref 0–37)
Albumin: 4.5 g/dL (ref 3.5–5.2)
Alkaline Phosphatase: 72 U/L (ref 39–117)
BUN: 24 mg/dL — ABNORMAL HIGH (ref 6–23)
Creatinine, Ser: 2.1 mg/dL — ABNORMAL HIGH (ref 0.50–1.35)
Potassium: 3.8 mEq/L (ref 3.5–5.3)

## 2012-03-21 LAB — IRON AND TIBC
%SAT: 52 % (ref 20–55)
TIBC: 202 ug/dL — ABNORMAL LOW (ref 215–435)
UIBC: 97 ug/dL — ABNORMAL LOW (ref 125–400)

## 2012-03-21 LAB — PROTEIN ELECTROPHORESIS, SERUM, WITH REFLEX
Albumin ELP: 52 % — ABNORMAL LOW (ref 55.8–66.1)
Alpha-1-Globulin: 3.6 % (ref 2.9–4.9)
Beta 2: 6 % (ref 3.2–6.5)
Beta Globulin: 5.7 % (ref 4.7–7.2)
Gamma Globulin: 20.4 % — ABNORMAL HIGH (ref 11.1–18.8)
Total Protein, Serum Electrophoresis: 7.9 g/dL (ref 6.0–8.3)

## 2012-03-21 LAB — IFE INTERPRETATION

## 2012-03-21 LAB — KAPPA/LAMBDA LIGHT CHAINS
Kappa free light chain: 3.23 mg/dL — ABNORMAL HIGH (ref 0.33–1.94)
Kappa:Lambda Ratio: 1.32 (ref 0.26–1.65)

## 2012-03-21 LAB — IGG, IGA, IGM
IgA: 414 mg/dL — ABNORMAL HIGH (ref 68–379)
IgM, Serum: 76 mg/dL (ref 41–251)

## 2012-03-21 NOTE — Progress Notes (Signed)
CC:   Gala Romney, MD, Fax [phone]  DIAGNOSES: 1. IgG kappa myeloma. 2. Anemia secondary to renal insufficiency. 3. Severe fibromyalgia. 4. Insulin dependent diabetes. 5. Hypotestosteronemia.  CURRENT THERAPY: 1. Aredia 60 mg IV q.2 months. 2. Aranesp 300 mcg subcu as needed for hemoglobin less than 11. 3. IV iron as indicated. 4. Depo-Testosterone 200 mg IM q.2 weeks.  INTERIM HISTORY:  [name] comes in for followup.  His main problem now is that he feels very tired.  He thinks he feels as if he needs some testosterone.  We did check his testosterone last year.  It was on the lower level.  As such, I will go ahead and give him some testosterone intramuscularly.  I do not think that he would do well with the patches or with the gels.  He has had no problems fever wise.  His fibromyalgia does bother him quite a bit during the winter time.  When we last checked his myeloma studies, there was no monoclonal spike noted in his serum.  His IgG level was 1610 mg/dL.  His kappa light chain was 3.38 mg/dL.  He has had no rashes.  He has had no change in bowel or bladder habits.  PHYSICAL EXAMINATION:  General:  This is a fairly well-developed, well- nourished black gentleman in no obvious distress.  Vital signs:  Show temperature of 97.7, pulse 80, respiratory rate 18, blood pressure 146/67.  Weight is 200 pounds.  Head and neck:  Shows a normocephalic, atraumatic skull.  There are no ocular or oral lesions.  There are no palpable cervical or supraclavicular lymph nodes.  Lungs:  Clear bilaterally.  Cardiac:  Regular rate and rhythm with a normal S1 and S2. There are no murmurs, rubs or bruits.  Abdomen:  Soft with good bowel sounds.  There is no palpable abdominal mass.  No palpable hepatosplenomegaly is noted.  Extremities:  Show no clubbing, cyanosis or edema.  He has some decreased range motion of his joints, which is chronic.  Skin:  No rash, ecchymosis or  petechia.  LABORATORY STUDIES:  White cell count is 8.1, hemoglobin 11.5, hematocrit 35.5, platelet count is 178.  Ferritin is 1289.  Iron saturation is 52%.  BUN is 24, creatinine is 2.1.  Calcium 9.6 with an albumin of 4.5.  IMPRESSION:  Mr. [name] is a 62 year old black gentleman with IgG kappa myeloma.  He has not required treatment for this now for I think over a year.  Again, I do not see any evidence of relapse.  We are mostly trying to maintain him.  I believe that the Aredia does help.  We will see if the testosterone injections can give him a little more energy.  We will have him come back every 2 weeks for his testosterone shots.  I will see him back in about 2 months.    ______________________________ Volanda Napoleon, M.D. PRE/MEDQ  D:  03/17/2012  T:  03/18/2012  Job:  [id number]

## 2012-03-31 ENCOUNTER — Other Ambulatory Visit: Payer: Medicaid Other | Admitting: Lab

## 2012-03-31 ENCOUNTER — Ambulatory Visit (HOSPITAL_BASED_OUTPATIENT_CLINIC_OR_DEPARTMENT_OTHER): Payer: Medicaid Other

## 2012-03-31 VITALS — BP 164/84 | HR 91 | Temp 97.5°F | Resp 20

## 2012-03-31 DIAGNOSIS — E291 Testicular hypofunction: Secondary | ICD-10-CM

## 2012-03-31 DIAGNOSIS — E349 Endocrine disorder, unspecified: Secondary | ICD-10-CM

## 2012-03-31 MED ORDER — TESTOSTERONE CYPIONATE 200 MG/ML IM SOLN
200.0000 mg | INTRAMUSCULAR | Status: DC
  Administered 2012-03-31: 200 mg via INTRAMUSCULAR

## 2012-03-31 NOTE — Patient Instructions (Signed)
Testosterone injection What is this medicine? TESTOSTERONE (tes TOS ter one) is the main male hormone. It supports normal male development such as muscle growth, facial hair, and deep voice. It is used in males to treat low testosterone levels. This medicine may be used for other purposes; ask your health care provider or pharmacist if you have questions. What should I tell my health care provider before I take this medicine? They need to know if you have any of these conditions: -breast cancer -diabetes -heart disease -kidney disease -liver disease -lung disease -prostate cancer, enlargement -an unusual or allergic reaction to testosterone, other medicines, foods, dyes, or preservatives -pregnant or trying to get pregnant -breast-feeding How should I use this medicine? This medicine is for injection into a muscle. It is usually given by a health care professional in a hospital or clinic setting. Contact your pediatrician regarding the use of this medicine in children. While this medicine may be prescribed for children as young as 49 years of age for selected conditions, precautions do apply. Overdosage: If you think you have taken too much of this medicine contact a poison control center or emergency room at once. NOTE: This medicine is only for you. Do not share this medicine with others. What if I miss a dose? Try not to miss a dose. Your doctor or health care professional will tell you when your next injection is due. Notify the office if you are unable to keep an appointment. What may interact with this medicine? -medicines for diabetes -medicines that treat or prevent blood clots like warfarin -oxyphenbutazone -propranolol -steroid medicines like prednisone or cortisone This list may not describe all possible interactions. Give your health care provider a list of all the medicines, herbs, non-prescription drugs, or dietary supplements you use. Also tell them if you smoke, drink  alcohol, or use illegal drugs. Some items may interact with your medicine. What should I watch for while using this medicine? Visit your doctor or health care professional for regular checks on your progress. They will need to check the level of testosterone in your blood. This medicine may affect blood sugar levels. If you have diabetes, check with your doctor or health care professional before you change your diet or the dose of your diabetic medicine. This drug is banned from use in athletes by most athletic organizations. What side effects may I notice from receiving this medicine? Side effects that you should report to your doctor or health care professional as soon as possible: -allergic reactions like skin rash, itching or hives, swelling of the face, lips, or tongue -breast enlargement -breathing problems -changes in mood, especially anger, depression, or rage -dark urine -general ill feeling or flu-like symptoms -light-colored stools -loss of appetite, nausea -nausea, vomiting -right upper belly pain -stomach pain -swelling of ankles -too frequent or persistent erections -trouble passing urine or change in the amount of urine -unusually weak or tired -yellowing of the eyes or skin Additional side effects that can occur in women include: -deep or hoarse voice -facial hair growth -irregular menstrual periods Side effects that usually do not require medical attention (report to your doctor or health care professional if they continue or are bothersome): -acne -change in sex drive or performance -hair loss -headache This list may not describe all possible side effects. Call your doctor for medical advice about side effects. You may report side effects to FDA at 1-800-FDA-1088. Where should I keep my medicine? Keep out of the reach of children. This medicine  can be abused. Keep your medicine in a safe place to protect it from theft. Do not share this medicine with anyone.  Selling or giving away this medicine is dangerous and against the law. Store at room temperature between 20 and 25 degrees C (68 and 77 degrees F). Do not freeze. Protect from light. Follow the directions for the product you are prescribed. Throw away any unused medicine after the expiration date. NOTE: This sheet is a summary. It may not cover all possible information. If you have questions about this medicine, talk to your doctor, pharmacist, or health care provider.  2012, [employer] Standard. (04/22/2007 4:13:46 PM)

## 2012-04-14 ENCOUNTER — Other Ambulatory Visit: Payer: Medicaid Other | Admitting: Lab

## 2012-04-14 ENCOUNTER — Ambulatory Visit (HOSPITAL_BASED_OUTPATIENT_CLINIC_OR_DEPARTMENT_OTHER): Payer: Medicaid Other

## 2012-04-14 VITALS — BP 161/76 | HR 108 | Temp 97.3°F | Resp 20

## 2012-04-14 DIAGNOSIS — E349 Endocrine disorder, unspecified: Secondary | ICD-10-CM

## 2012-04-14 DIAGNOSIS — E291 Testicular hypofunction: Secondary | ICD-10-CM

## 2012-04-14 MED ORDER — TESTOSTERONE CYPIONATE 200 MG/ML IM SOLN
200.0000 mg | INTRAMUSCULAR | Status: DC
  Administered 2012-04-14: 200 mg via INTRAMUSCULAR

## 2012-04-14 NOTE — Patient Instructions (Signed)
Testosterone injection What is this medicine? TESTOSTERONE (tes TOS ter one) is the main male hormone. It supports normal male development such as muscle growth, facial hair, and deep voice. It is used in males to treat low testosterone levels. This medicine may be used for other purposes; ask your health care provider or pharmacist if you have questions. What should I tell my health care provider before I take this medicine? They need to know if you have any of these conditions: -breast cancer -diabetes -heart disease -kidney disease -liver disease -lung disease -prostate cancer, enlargement -an unusual or allergic reaction to testosterone, other medicines, foods, dyes, or preservatives -pregnant or trying to get pregnant -breast-feeding How should I use this medicine? This medicine is for injection into a muscle. It is usually given by a health care professional in a hospital or clinic setting. Contact your pediatrician regarding the use of this medicine in children. While this medicine may be prescribed for children as young as 49 years of age for selected conditions, precautions do apply. Overdosage: If you think you have taken too much of this medicine contact a poison control center or emergency room at once. NOTE: This medicine is only for you. Do not share this medicine with others. What if I miss a dose? Try not to miss a dose. Your doctor or health care professional will tell you when your next injection is due. Notify the office if you are unable to keep an appointment. What may interact with this medicine? -medicines for diabetes -medicines that treat or prevent blood clots like warfarin -oxyphenbutazone -propranolol -steroid medicines like prednisone or cortisone This list may not describe all possible interactions. Give your health care provider a list of all the medicines, herbs, non-prescription drugs, or dietary supplements you use. Also tell them if you smoke, drink  alcohol, or use illegal drugs. Some items may interact with your medicine. What should I watch for while using this medicine? Visit your doctor or health care professional for regular checks on your progress. They will need to check the level of testosterone in your blood. This medicine may affect blood sugar levels. If you have diabetes, check with your doctor or health care professional before you change your diet or the dose of your diabetic medicine. This drug is banned from use in athletes by most athletic organizations. What side effects may I notice from receiving this medicine? Side effects that you should report to your doctor or health care professional as soon as possible: -allergic reactions like skin rash, itching or hives, swelling of the face, lips, or tongue -breast enlargement -breathing problems -changes in mood, especially anger, depression, or rage -dark urine -general ill feeling or flu-like symptoms -light-colored stools -loss of appetite, nausea -nausea, vomiting -right upper belly pain -stomach pain -swelling of ankles -too frequent or persistent erections -trouble passing urine or change in the amount of urine -unusually weak or tired -yellowing of the eyes or skin Additional side effects that can occur in women include: -deep or hoarse voice -facial hair growth -irregular menstrual periods Side effects that usually do not require medical attention (report to your doctor or health care professional if they continue or are bothersome): -acne -change in sex drive or performance -hair loss -headache This list may not describe all possible side effects. Call your doctor for medical advice about side effects. You may report side effects to FDA at 1-800-FDA-1088. Where should I keep my medicine? Keep out of the reach of children. This medicine  can be abused. Keep your medicine in a safe place to protect it from theft. Do not share this medicine with anyone.  Selling or giving away this medicine is dangerous and against the law. Store at room temperature between 20 and 25 degrees C (68 and 77 degrees F). Do not freeze. Protect from light. Follow the directions for the product you are prescribed. Throw away any unused medicine after the expiration date. NOTE: This sheet is a summary. It may not cover all possible information. If you have questions about this medicine, talk to your doctor, pharmacist, or health care provider.  2012, [employer] Standard. (04/22/2007 4:13:46 PM)

## 2012-04-28 ENCOUNTER — Other Ambulatory Visit: Payer: Self-pay | Admitting: Medical

## 2012-04-28 ENCOUNTER — Other Ambulatory Visit (HOSPITAL_BASED_OUTPATIENT_CLINIC_OR_DEPARTMENT_OTHER): Payer: Medicaid Other | Admitting: Lab

## 2012-04-28 ENCOUNTER — Other Ambulatory Visit: Payer: Medicaid Other | Admitting: Lab

## 2012-04-28 ENCOUNTER — Ambulatory Visit: Payer: Medicaid Other

## 2012-04-28 DIAGNOSIS — D631 Anemia in chronic kidney disease: Secondary | ICD-10-CM

## 2012-04-28 DIAGNOSIS — C9000 Multiple myeloma not having achieved remission: Secondary | ICD-10-CM

## 2012-04-28 LAB — CBC WITH DIFFERENTIAL (CANCER CENTER ONLY)
Eosinophils Absolute: 0.3 10*3/uL (ref 0.0–0.5)
HCT: 36 % — ABNORMAL LOW (ref 38.7–49.9)
HGB: 11.9 g/dL — ABNORMAL LOW (ref 13.0–17.1)
LYMPH%: 22.4 % (ref 14.0–48.0)
MCV: 76 fL — ABNORMAL LOW (ref 82–98)
MONO#: 0.8 10*3/uL (ref 0.1–0.9)
Platelets: 236 10*3/uL (ref 145–400)
RBC: 4.72 10*6/uL (ref 4.20–5.70)
WBC: 7.4 10*3/uL (ref 4.0–10.0)

## 2012-05-12 ENCOUNTER — Ambulatory Visit (HOSPITAL_BASED_OUTPATIENT_CLINIC_OR_DEPARTMENT_OTHER): Payer: Medicaid Other | Admitting: Lab

## 2012-05-12 ENCOUNTER — Ambulatory Visit (HOSPITAL_BASED_OUTPATIENT_CLINIC_OR_DEPARTMENT_OTHER): Payer: Medicaid Other | Admitting: Medical

## 2012-05-12 ENCOUNTER — Ambulatory Visit (HOSPITAL_BASED_OUTPATIENT_CLINIC_OR_DEPARTMENT_OTHER): Payer: Medicaid Other

## 2012-05-12 VITALS — BP 155/74 | HR 83 | Temp 97.9°F | Resp 18 | Ht 69.0 in | Wt 196.0 lb

## 2012-05-12 DIAGNOSIS — E349 Endocrine disorder, unspecified: Secondary | ICD-10-CM

## 2012-05-12 DIAGNOSIS — C9000 Multiple myeloma not having achieved remission: Secondary | ICD-10-CM

## 2012-05-12 DIAGNOSIS — E291 Testicular hypofunction: Secondary | ICD-10-CM

## 2012-05-12 DIAGNOSIS — N289 Disorder of kidney and ureter, unspecified: Secondary | ICD-10-CM

## 2012-05-12 DIAGNOSIS — D649 Anemia, unspecified: Secondary | ICD-10-CM

## 2012-05-12 DIAGNOSIS — D509 Iron deficiency anemia, unspecified: Secondary | ICD-10-CM

## 2012-05-12 DIAGNOSIS — M545 Low back pain: Secondary | ICD-10-CM

## 2012-05-12 LAB — CBC WITH DIFFERENTIAL (CANCER CENTER ONLY)
BASO%: 0.8 % (ref 0.0–2.0)
Eosinophils Absolute: 0.3 10*3/uL (ref 0.0–0.5)
HCT: 36.4 % — ABNORMAL LOW (ref 38.7–49.9)
LYMPH%: 24 % (ref 14.0–48.0)
MCV: 76 fL — ABNORMAL LOW (ref 82–98)
MONO#: 0.8 10*3/uL (ref 0.1–0.9)
NEUT%: 61.3 % (ref 40.0–80.0)
RDW: 16.3 % — ABNORMAL HIGH (ref 11.1–15.7)
WBC: 8 10*3/uL (ref 4.0–10.0)

## 2012-05-12 MED ORDER — HYDROMORPHONE HCL PF 2 MG/ML IJ SOLN
2.0000 mg | Freq: Once | INTRAMUSCULAR | Status: AC
  Administered 2012-05-12: 2 mg via INTRAVENOUS

## 2012-05-12 MED ORDER — SODIUM CHLORIDE 0.9 % IV SOLN
Freq: Once | INTRAVENOUS | Status: AC
  Administered 2012-05-12: 09:00:00 via INTRAVENOUS

## 2012-05-12 MED ORDER — HEPARIN SOD (PORK) LOCK FLUSH 100 UNIT/ML IV SOLN
500.0000 [IU] | Freq: Once | INTRAVENOUS | Status: AC
  Administered 2012-05-12: 500 [IU] via INTRAVENOUS
  Filled 2012-05-12: qty 5

## 2012-05-12 MED ORDER — DARBEPOETIN ALFA-POLYSORBATE 300 MCG/0.6ML IJ SOLN: 300.0000 ug | Freq: Once | INTRAMUSCULAR | Status: DC

## 2012-05-12 MED ORDER — SODIUM CHLORIDE 0.9 % IJ SOLN
10.0000 mL | INTRAMUSCULAR | Status: DC | PRN
  Administered 2012-05-12: 10 mL via INTRAVENOUS
  Filled 2012-05-12: qty 10

## 2012-05-12 MED ORDER — TESTOSTERONE CYPIONATE 200 MG/ML IM SOLN
200.0000 mg | INTRAMUSCULAR | Status: DC
  Administered 2012-05-12: 200 mg via INTRAMUSCULAR

## 2012-05-12 MED ORDER — SODIUM CHLORIDE 0.9 % IV SOLN
60.0000 mg | Freq: Once | INTRAVENOUS | Status: AC
  Administered 2012-05-12: 60 mg via INTRAVENOUS
  Filled 2012-05-12: qty 20

## 2012-05-12 NOTE — Progress Notes (Signed)
DIAGNOSES: 1. IgG kappa myeloma. 2. Anemia secondary to renal insufficiency. 3. Severe fibromyalgia. 4. Insulin dependent diabetes. 5. Hypotestosteronemia.  CURRENT THERAPY: 1. Aredia 60 mg IV q.2 months. 2. Aranesp 300 mcg subcu as needed for hemoglobin less than 11. 3. IV iron as indicated. 4. Depo-Testosterone 200 mg IM q.2 weeks.  INTERIM HISTORY: [name] presents today for an office followup visit.  Unfortunately, he, reports, that he's not doing too well.  He still continues to have some chronic pain.  He does have severe fibromyalgia. He does state that he has had new increased lower back pain.  He, reports, that this has been going on for the past 2 weeks.  He, also reports, that for the past week, he has had some bowel incontinence.  He states he followed up with his primary care physician, who gave him some "medicine" for this problem.  Mr. Pinks, reports, the medicine does not help.  I explained to Mr. Salah, that I was worried about a possible spinal cord compression given his symptoms, and that I would like to set him up for an MRI.  He is agreeable to this.  He's not reporting any other neurologic symptoms. He is walking without any problems.  He denies any type of neuropathy.  He's not having any type of paralysis.  His last myeloma studies revealed an IgG of 1800 mg/dL, his kappa free light chain was 3.23 mg/dL, and an M spike was not detected.  He still continues to receive testosterone, 200 mg IM every 2 weeks.  He receives Aranesp 300 mcg subcutaneous as needed for hemoglobin less than 11.  He continues on Aredia 60 mg IV every 2 months.  Overall, he seems to tolerate these well without any complications.  He, reports, he has a decent, appetite.  He denies any nausea, vomiting, diarrhea, constipation, chest pain, shortness of breath, cough.  He denies any fevers, chills, or night sweats.  He denies any lower leg swelling.  He denies any headaches, visual changes, or  rashes.  Review of Systems: Constitutional:Negative for malaise/fatigue, fever, chills, weight loss, diaphoresis, activity change, appetite change, and unexpected weight change.  HEENT: Negative for double vision, blurred vision, visual loss, ear pain, tinnitus, congestion, rhinorrhea, epistaxis sore throat or sinus disease, oral pain/lesion, tongue soreness Respiratory: Negative for cough, chest tightness, shortness of breath, wheezing and stridor.  Cardiovascular: Negative for chest pain, palpitations, leg swelling, orthopnea, PND, DOE or claudication Gastrointestinal: Negative for nausea, vomiting, abdominal pain, diarrhea, constipation, blood in stool, melena, hematochezia, abdominal distention, anal bleeding, rectal pain, anorexia and hematemesis.  Genitourinary: Negative for dysuria, frequency, hematuria,  Musculoskeletal: Negative for myalgias, back pain, joint swelling, arthralgias and gait problem.  Skin: Negative for rash, color change, pallor and wound.  Neurological:. Negative for dizziness/light-headedness, tremors, seizures, syncope, facial asymmetry, speech difficulty, weakness, numbness, headaches and paresthesias.  Hematological: Negative for adenopathy. Does not bruise/bleed easily.  Psychiatric/Behavioral:  Negative for depression, no loss of interest in normal activity or change in sleep pattern.   Physical Exam: This is a pleasant, 62 year old, well-developed, well-nourished Afro-American gentleman, in no obvious distress Vitals: Temperature 97.9 degrees, pulse 83, respirations 18, blood pressure 155/74.  Weight 196 pounds HEENT reveals a normocephalic, atraumatic skull, no scleral icterus, no oral lesions  Neck is supple without any cervical or supraclavicular adenopathy.  Lungs are clear to auscultation bilaterally. There are no wheezes, rales or rhonci Cardiac is regular rate and rhythm with a normal S1 and S2. There are no  murmurs, rubs, or bruits.  Abdomen is soft with  good bowel sounds, there is no palpable mass. There is no palpable hepatosplenomegaly. There is no palpable fluid wave.  Musculoskeletal no tenderness of the spine, ribs, or hips.  Extremities there are no clubbing, cyanosis, or edema.  Skin no petechia, purpura or ecchymosis Neurologic is nonfocal.  Laboratory Data: White count 8.0, hemoglobin 12.2, hematocrit 36.4, MCV 76, platelets 206,000  Current Outpatient Prescriptions on File Prior to Visit  Medication Sig Dispense Refill  . amLODipine (NORVASC) 10 MG tablet Take 10 mg by mouth daily.        Marland Kitchen aspirin 81 MG tablet Take 81 mg by mouth daily.      Marland Kitchen CARAFATE 1 GM/10ML suspension 2 (two) times daily.       . ergocalciferol (VITAMIN D2) 50000 UNITS capsule Take 50,000 Units by mouth once a week.        . furosemide (LASIX) 20 MG tablet Take 20 mg by mouth daily.        Marland Kitchen glipiZIDE (GLUCOTROL) 5 MG tablet Take 5 mg by mouth daily.       . hydrALAZINE (APRESOLINE) 25 MG tablet Take 25 mg by mouth 3 (three) times daily.      . Hypromellose (NATURAL BALANCE TEARS OP) Apply to eye every morning.      . meclizine (ANTIVERT) 50 MG tablet Take 0.5 tablets (25 mg total) by mouth 4 (four) times daily as needed for dizziness.  30 tablet  0  . methocarbamol (ROBAXIN) 500 MG tablet Take 500 mg by mouth 4 (four) times daily.       Marland Kitchen oxyCODONE (OXYCONTIN) 80 MG 12 hr tablet Take 80 mg by mouth 3 (three) times daily.      Marland Kitchen oxyCODONE-acetaminophen (PERCOCET) 10-325 MG per tablet Take 1 tablet by mouth every 4 (four) hours as needed. Pain    hisotr  . potassium chloride SA (K-DUR,KLOR-CON) 20 MEQ tablet Take 20 mEq by mouth 2 (two) times daily.      . Tamsulosin HCl (FLOMAX) 0.4 MG CAPS Take 0.4 mg by mouth daily.       Marland Kitchen triamterene-hydrochlorothiazide (MAXZIDE-25) 37.5-25 MG per tablet Take 1 tablet by mouth daily.        . [DISCONTINUED] omeprazole (PRILOSEC) 40 MG capsule Take 1 capsule (40 mg total) by mouth daily.  30 capsule  11   No current  facility-administered medications on file prior to visit.   Assessment/Plan: This is a pleasant, 62 year old, gentleman, with the following issues:  #1.  IgG kappa myeloma.  He has not required treatment for this now for over a year.  His M spike still remains nondetectable.  #2.  Bowel incontinence./Increased lower back pain.  I'm going to go ahead and set him up for an MRI of the lumbar spine today.   #3.  Supportive therapy.  He will continue with Aredia every 2 months.  #4.  Anemia, secondary to renal insufficiency.  He receives Aranesp 300 mcg subcutaneous for hemoglobin less than 11.  He does not require an Aranesp injection today.  #5.  Hypo-testosteronemia.  He will continue on testosterone, 200 mg IM every 2 week for the time being.  #6.  Followup.  He will follow back up with [place] on 07/07/2012, but before then should there be questions or concerns.  Addendum: We informed Mr. Bridwell, that he did have his MRI done.  This evening at [facility].  Imaging.  Tone is unable to do his  MRI, and he'll Friday at 3:30.  Mr. Barraco said that he would wait until Friday.  I did explain to him that if this is a spinal cord compression, it is an emergency, and I would really rather him get the MR today.  He has deferred his MR until Friday.

## 2012-05-12 NOTE — Patient Instructions (Addendum)
Testosterone injection What is this medicine? TESTOSTERONE (tes TOS ter one) is the main male hormone. It supports normal male development such as muscle growth, facial hair, and deep voice. It is used in males to treat low testosterone levels. This medicine may be used for other purposes; ask your health care provider or pharmacist if you have questions. What should I tell my health care provider before I take this medicine? They need to know if you have any of these conditions: -breast cancer -diabetes -heart disease -kidney disease -liver disease -lung disease -prostate cancer, enlargement -an unusual or allergic reaction to testosterone, other medicines, foods, dyes, or preservatives -pregnant or trying to get pregnant -breast-feeding How should I use this medicine? This medicine is for injection into a muscle. It is usually given by a health care professional in a hospital or clinic setting. Contact your pediatrician regarding the use of this medicine in children. While this medicine may be prescribed for children as young as 49 years of age for selected conditions, precautions do apply. Overdosage: If you think you have taken too much of this medicine contact a poison control center or emergency room at once. NOTE: This medicine is only for you. Do not share this medicine with others. What if I miss a dose? Try not to miss a dose. Your doctor or health care professional will tell you when your next injection is due. Notify the office if you are unable to keep an appointment. What may interact with this medicine? -medicines for diabetes -medicines that treat or prevent blood clots like warfarin -oxyphenbutazone -propranolol -steroid medicines like prednisone or cortisone This list may not describe all possible interactions. Give your health care provider a list of all the medicines, herbs, non-prescription drugs, or dietary supplements you use. Also tell them if you smoke, drink  alcohol, or use illegal drugs. Some items may interact with your medicine. What should I watch for while using this medicine? Visit your doctor or health care professional for regular checks on your progress. They will need to check the level of testosterone in your blood. This medicine may affect blood sugar levels. If you have diabetes, check with your doctor or health care professional before you change your diet or the dose of your diabetic medicine. This drug is banned from use in athletes by most athletic organizations. What side effects may I notice from receiving this medicine? Side effects that you should report to your doctor or health care professional as soon as possible: -allergic reactions like skin rash, itching or hives, swelling of the face, lips, or tongue -breast enlargement -breathing problems -changes in mood, especially anger, depression, or rage -dark urine -general ill feeling or flu-like symptoms -light-colored stools -loss of appetite, nausea -nausea, vomiting -right upper belly pain -stomach pain -swelling of ankles -too frequent or persistent erections -trouble passing urine or change in the amount of urine -unusually weak or tired -yellowing of the eyes or skin Additional side effects that can occur in women include: -deep or hoarse voice -facial hair growth -irregular menstrual periods Side effects that usually do not require medical attention (report to your doctor or health care professional if they continue or are bothersome): -acne -change in sex drive or performance -hair loss -headache This list may not describe all possible side effects. Call your doctor for medical advice about side effects. You may report side effects to FDA at 1-800-FDA-1088. Where should I keep my medicine? Keep out of the reach of children. This medicine  can be abused. Keep your medicine in a safe place to protect it from theft. Do not share this medicine with anyone.  Selling or giving away this medicine is dangerous and against the law. Store at room temperature between 20 and 25 degrees C (68 and 77 degrees F). Do not freeze. Protect from light. Follow the directions for the product you are prescribed. Throw away any unused medicine after the expiration date. NOTE: This sheet is a summary. It may not cover all possible information. If you have questions about this medicine, talk to your doctor, pharmacist, or health care provider.  2013, Elsevier/Gold Standard. (04/22/2007 4:13:46 PM) Pamidronate injection What is this medicine? PAMIDRONATE (pa mi DROE nate) slows calcium loss from bones. It is used to treat high calcium blood levels from cancer or Paget's disease. It is also used to treat bone pain and prevent fractures from certain cancers that have spread to the bone. This medicine may be used for other purposes; ask your health care provider or pharmacist if you have questions. What should I tell my health care provider before I take this medicine? They need to know if you have any of these conditions: -aspirin-sensitive asthma -dental disease -kidney disease -an unusual or allergic reaction to pamidronate, other medicines, foods, dyes, or preservatives -pregnant or trying to get pregnant -breast-feeding How should I use this medicine? This medicine is for infusion into a vein. It is given by a health care professional in a hospital or clinic setting. Talk to your pediatrician regarding the use of this medicine in children. This medicine is not approved for use in children. Overdosage: If you think you have taken too much of this medicine contact a poison control center or emergency room at once. NOTE: This medicine is only for you. Do not share this medicine with others. What if I miss a dose? This does not apply. What may interact with this medicine? -certain antibiotics given by injection -medicines for inflammation or pain like ibuprofen,  naproxen -some diuretics like bumetanide, furosemide -cyclosporine -parathyroid hormone -tacrolimus -teriparatide -thalidomide This list may not describe all possible interactions. Give your health care provider a list of all the medicines, herbs, non-prescription drugs, or dietary supplements you use. Also tell them if you smoke, drink alcohol, or use illegal drugs. Some items may interact with your medicine. What should I watch for while using this medicine? Visit your doctor or health care professional for regular checkups. It may be some time before you see the benefit from this medicine. Do not stop taking your medicine unless your doctor tells you to. Your doctor may order blood tests or other tests to see how you are doing. Women should inform their doctor if they wish to become pregnant or think they might be pregnant. There is a potential for serious side effects to an unborn child. Talk to your health care professional or pharmacist for more information. You should make sure that you get enough calcium and vitamin D while you are taking this medicine. Discuss the foods you eat and the vitamins you take with your health care professional. Some people who take this medicine have severe bone, joint, and/or muscle pain. This medicine may also increase your risk for a broken thigh bone. Tell your doctor right away if you have pain in your upper leg or groin. Tell your doctor if you have any pain that does not go away or that gets worse. What side effects may I notice from receiving this medicine?  Side effects that you should report to your doctor or health care professional as soon as possible: -allergic reactions like skin rash, itching or hives, swelling of the face, lips, or tongue -black or tarry stools -changes in vision -eye inflammation, pain -high blood pressure -jaw pain, especially burning or cramping -muscle weakness -numb, tingling pain -swelling of feet or hands -trouble  passing urine or change in the amount of urine -unable to move easily Side effects that usually do not require medical attention (report to your doctor or health care professional if they continue or are bothersome): -bone, joint, or muscle pain -constipation -dizzy, drowsy -fever -headache -loss of appetite -nausea, vomiting -pain at site where injected This list may not describe all possible side effects. Call your doctor for medical advice about side effects. You may report side effects to FDA at 1-800-FDA-1088. Where should I keep my medicine? This drug is given in a hospital or clinic and will not be stored at home. NOTE: This sheet is a summary. It may not cover all possible information. If you have questions about this medicine, talk to your doctor, pharmacist, or health care provider.  2013, Elsevier/Gold Standard. (08/08/2010 8:49:49 AM)

## 2012-05-13 ENCOUNTER — Ambulatory Visit (HOSPITAL_COMMUNITY)
Admission: RE | Admit: 2012-05-13 | Discharge: 2012-05-13 | Disposition: A | Discharge status: Home / Self Care | Payer: Medicaid Other | Source: Ambulatory Visit | Attending: Medical | Admitting: Medical

## 2012-05-13 DIAGNOSIS — M545 Low back pain, unspecified: Secondary | ICD-10-CM | POA: Insufficient documentation

## 2012-05-13 DIAGNOSIS — R159 Full incontinence of feces: Secondary | ICD-10-CM | POA: Insufficient documentation

## 2012-05-13 DIAGNOSIS — M533 Sacrococcygeal disorders, not elsewhere classified: Secondary | ICD-10-CM | POA: Insufficient documentation

## 2012-05-13 DIAGNOSIS — Z87898 Personal history of other specified conditions: Secondary | ICD-10-CM | POA: Insufficient documentation

## 2012-05-13 DIAGNOSIS — C9000 Multiple myeloma not having achieved remission: Secondary | ICD-10-CM

## 2012-05-13 DIAGNOSIS — M5126 Other intervertebral disc displacement, lumbar region: Secondary | ICD-10-CM | POA: Insufficient documentation

## 2012-05-16 LAB — COMPREHENSIVE METABOLIC PANEL
Alkaline Phosphatase: 76 U/L (ref 39–117)
BUN: 22 mg/dL (ref 6–23)
Glucose, Bld: 133 mg/dL — ABNORMAL HIGH (ref 70–99)
Total Bilirubin: 0.3 mg/dL (ref 0.3–1.2)

## 2012-05-16 LAB — IRON AND TIBC
%SAT: 45 % (ref 20–55)
Iron: 107 ug/dL (ref 42–165)
TIBC: 238 ug/dL (ref 215–435)
UIBC: 131 ug/dL (ref 125–400)

## 2012-05-16 LAB — RETICULOCYTES (CHCC)
RBC.: 4.75 MIL/uL (ref 4.22–5.81)
Retic Ct Pct: 0.5 % (ref 0.4–2.3)

## 2012-05-16 LAB — IGG, IGA, IGM: IgM, Serum: 74 mg/dL (ref 41–251)

## 2012-05-16 LAB — PROTEIN ELECTROPHORESIS, SERUM, WITH REFLEX
Alpha-2-Globulin: 12.5 % — ABNORMAL HIGH (ref 7.1–11.8)
Beta 2: 6 % (ref 3.2–6.5)
Beta Globulin: 5.7 % (ref 4.7–7.2)
Gamma Globulin: 20.9 % — ABNORMAL HIGH (ref 11.1–18.8)

## 2012-05-16 LAB — IFE INTERPRETATION

## 2012-05-16 LAB — FERRITIN: Ferritin: 1134 ng/mL — ABNORMAL HIGH (ref 22–322)

## 2012-05-19 ENCOUNTER — Telehealth: Payer: Self-pay | Admitting: *Deleted

## 2012-05-19 NOTE — Telephone Encounter (Signed)
Message copied by Rico Ala on Thu May 19, 2012 11:55 AM ------      Message from: Burney Gauze R      Created: Wed May 18, 2012  8:11 PM       Call - labs and iron and myeloma studies are ok!!  Laurey Arrow ------

## 2012-05-19 NOTE — Telephone Encounter (Signed)
Called patient to let him know that his labwork and myeloma studies are ok per d.r ennever

## 2012-05-26 ENCOUNTER — Other Ambulatory Visit: Payer: Medicaid Other | Admitting: Lab

## 2012-05-26 ENCOUNTER — Ambulatory Visit (HOSPITAL_BASED_OUTPATIENT_CLINIC_OR_DEPARTMENT_OTHER): Payer: Medicaid Other

## 2012-05-26 VITALS — BP 156/77 | HR 89 | Temp 98.6°F | Resp 20

## 2012-05-26 DIAGNOSIS — E291 Testicular hypofunction: Secondary | ICD-10-CM

## 2012-05-26 DIAGNOSIS — E349 Endocrine disorder, unspecified: Secondary | ICD-10-CM

## 2012-05-26 MED ORDER — TESTOSTERONE CYPIONATE 200 MG/ML IM SOLN
200.0000 mg | INTRAMUSCULAR | Status: DC
  Administered 2012-05-26: 200 mg via INTRAMUSCULAR

## 2012-05-26 NOTE — Patient Instructions (Signed)
Testosterone injection What is this medicine? TESTOSTERONE (tes TOS ter one) is the main male hormone. It supports normal male development such as muscle growth, facial hair, and deep voice. It is used in males to treat low testosterone levels. This medicine may be used for other purposes; ask your health care provider or pharmacist if you have questions. What should I tell my health care provider before I take this medicine? They need to know if you have any of these conditions: -breast cancer -diabetes -heart disease -kidney disease -liver disease -lung disease -prostate cancer, enlargement -an unusual or allergic reaction to testosterone, other medicines, foods, dyes, or preservatives -pregnant or trying to get pregnant -breast-feeding How should I use this medicine? This medicine is for injection into a muscle. It is usually given by a health care professional in a hospital or clinic setting. Contact your pediatrician regarding the use of this medicine in children. While this medicine may be prescribed for children as young as 49 years of age for selected conditions, precautions do apply. Overdosage: If you think you have taken too much of this medicine contact a poison control center or emergency room at once. NOTE: This medicine is only for you. Do not share this medicine with others. What if I miss a dose? Try not to miss a dose. Your doctor or health care professional will tell you when your next injection is due. Notify the office if you are unable to keep an appointment. What may interact with this medicine? -medicines for diabetes -medicines that treat or prevent blood clots like warfarin -oxyphenbutazone -propranolol -steroid medicines like prednisone or cortisone This list may not describe all possible interactions. Give your health care provider a list of all the medicines, herbs, non-prescription drugs, or dietary supplements you use. Also tell them if you smoke, drink  alcohol, or use illegal drugs. Some items may interact with your medicine. What should I watch for while using this medicine? Visit your doctor or health care professional for regular checks on your progress. They will need to check the level of testosterone in your blood. This medicine may affect blood sugar levels. If you have diabetes, check with your doctor or health care professional before you change your diet or the dose of your diabetic medicine. This drug is banned from use in athletes by most athletic organizations. What side effects may I notice from receiving this medicine? Side effects that you should report to your doctor or health care professional as soon as possible: -allergic reactions like skin rash, itching or hives, swelling of the face, lips, or tongue -breast enlargement -breathing problems -changes in mood, especially anger, depression, or rage -dark urine -general ill feeling or flu-like symptoms -light-colored stools -loss of appetite, nausea -nausea, vomiting -right upper belly pain -stomach pain -swelling of ankles -too frequent or persistent erections -trouble passing urine or change in the amount of urine -unusually weak or tired -yellowing of the eyes or skin Additional side effects that can occur in women include: -deep or hoarse voice -facial hair growth -irregular menstrual periods Side effects that usually do not require medical attention (report to your doctor or health care professional if they continue or are bothersome): -acne -change in sex drive or performance -hair loss -headache This list may not describe all possible side effects. Call your doctor for medical advice about side effects. You may report side effects to FDA at 1-800-FDA-1088. Where should I keep my medicine? Keep out of the reach of children. This medicine  can be abused. Keep your medicine in a safe place to protect it from theft. Do not share this medicine with anyone.  Selling or giving away this medicine is dangerous and against the law. Store at room temperature between 20 and 25 degrees C (68 and 77 degrees F). Do not freeze. Protect from light. Follow the directions for the product you are prescribed. Throw away any unused medicine after the expiration date. NOTE: This sheet is a summary. It may not cover all possible information. If you have questions about this medicine, talk to your doctor, pharmacist, or health care provider.  2012, [employer] Standard. (04/22/2007 4:13:46 PM)

## 2012-06-09 ENCOUNTER — Ambulatory Visit (HOSPITAL_BASED_OUTPATIENT_CLINIC_OR_DEPARTMENT_OTHER): Payer: Medicaid Other

## 2012-06-09 ENCOUNTER — Other Ambulatory Visit: Payer: Medicaid Other | Admitting: Lab

## 2012-06-09 VITALS — BP 166/87 | HR 86 | Temp 97.0°F | Resp 20

## 2012-06-09 DIAGNOSIS — C9000 Multiple myeloma not having achieved remission: Secondary | ICD-10-CM

## 2012-06-09 DIAGNOSIS — E349 Endocrine disorder, unspecified: Secondary | ICD-10-CM

## 2012-06-09 DIAGNOSIS — D509 Iron deficiency anemia, unspecified: Secondary | ICD-10-CM

## 2012-06-09 DIAGNOSIS — E291 Testicular hypofunction: Secondary | ICD-10-CM

## 2012-06-09 MED ORDER — DARBEPOETIN ALFA-POLYSORBATE 300 MCG/0.6ML IJ SOLN: 300.0000 ug | Freq: Once | INTRAMUSCULAR | Status: DC

## 2012-06-09 MED ORDER — TESTOSTERONE CYPIONATE 200 MG/ML IM SOLN
200.0000 mg | INTRAMUSCULAR | Status: DC
  Administered 2012-06-09: 200 mg via INTRAMUSCULAR

## 2012-06-09 MED ORDER — SODIUM CHLORIDE 0.9 % IV SOLN: 60.0000 mg | Freq: Once | INTRAVENOUS | Status: DC

## 2012-06-09 NOTE — Patient Instructions (Signed)
Testosterone injection What is this medicine? TESTOSTERONE (tes TOS ter one) is the main male hormone. It supports normal male development such as muscle growth, facial hair, and deep voice. It is used in males to treat low testosterone levels. This medicine may be used for other purposes; ask your health care provider or pharmacist if you have questions. What should I tell my health care provider before I take this medicine? They need to know if you have any of these conditions: -breast cancer -diabetes -heart disease -kidney disease -liver disease -lung disease -prostate cancer, enlargement -an unusual or allergic reaction to testosterone, other medicines, foods, dyes, or preservatives -pregnant or trying to get pregnant -breast-feeding How should I use this medicine? This medicine is for injection into a muscle. It is usually given by a health care professional in a hospital or clinic setting. Contact your pediatrician regarding the use of this medicine in children. While this medicine may be prescribed for children as young as 49 years of age for selected conditions, precautions do apply. Overdosage: If you think you have taken too much of this medicine contact a poison control center or emergency room at once. NOTE: This medicine is only for you. Do not share this medicine with others. What if I miss a dose? Try not to miss a dose. Your doctor or health care professional will tell you when your next injection is due. Notify the office if you are unable to keep an appointment. What may interact with this medicine? -medicines for diabetes -medicines that treat or prevent blood clots like warfarin -oxyphenbutazone -propranolol -steroid medicines like prednisone or cortisone This list may not describe all possible interactions. Give your health care provider a list of all the medicines, herbs, non-prescription drugs, or dietary supplements you use. Also tell them if you smoke, drink  alcohol, or use illegal drugs. Some items may interact with your medicine. What should I watch for while using this medicine? Visit your doctor or health care professional for regular checks on your progress. They will need to check the level of testosterone in your blood. This medicine may affect blood sugar levels. If you have diabetes, check with your doctor or health care professional before you change your diet or the dose of your diabetic medicine. This drug is banned from use in athletes by most athletic organizations. What side effects may I notice from receiving this medicine? Side effects that you should report to your doctor or health care professional as soon as possible: -allergic reactions like skin rash, itching or hives, swelling of the face, lips, or tongue -breast enlargement -breathing problems -changes in mood, especially anger, depression, or rage -dark urine -general ill feeling or flu-like symptoms -light-colored stools -loss of appetite, nausea -nausea, vomiting -right upper belly pain -stomach pain -swelling of ankles -too frequent or persistent erections -trouble passing urine or change in the amount of urine -unusually weak or tired -yellowing of the eyes or skin Additional side effects that can occur in women include: -deep or hoarse voice -facial hair growth -irregular menstrual periods Side effects that usually do not require medical attention (report to your doctor or health care professional if they continue or are bothersome): -acne -change in sex drive or performance -hair loss -headache This list may not describe all possible side effects. Call your doctor for medical advice about side effects. You may report side effects to FDA at 1-800-FDA-1088. Where should I keep my medicine? Keep out of the reach of children. This medicine  can be abused. Keep your medicine in a safe place to protect it from theft. Do not share this medicine with anyone.  Selling or giving away this medicine is dangerous and against the law. Store at room temperature between 20 and 25 degrees C (68 and 77 degrees F). Do not freeze. Protect from light. Follow the directions for the product you are prescribed. Throw away any unused medicine after the expiration date. NOTE: This sheet is a summary. It may not cover all possible information. If you have questions about this medicine, talk to your doctor, pharmacist, or health care provider.  2012, [employer] Standard. (04/22/2007 4:13:46 PM)

## 2012-06-21 ENCOUNTER — Encounter: Payer: Self-pay | Admitting: Hematology & Oncology

## 2012-06-21 NOTE — Progress Notes (Signed)
Patient called in checking his appt time for Thursday. I advised him of lab at 8:15.

## 2012-06-23 ENCOUNTER — Other Ambulatory Visit: Payer: Medicaid Other | Admitting: Lab

## 2012-06-23 ENCOUNTER — Ambulatory Visit (HOSPITAL_BASED_OUTPATIENT_CLINIC_OR_DEPARTMENT_OTHER): Payer: Medicaid Other

## 2012-06-23 VITALS — BP 158/85 | HR 81 | Temp 97.2°F | Resp 20

## 2012-06-23 DIAGNOSIS — E291 Testicular hypofunction: Secondary | ICD-10-CM

## 2012-06-23 DIAGNOSIS — E349 Endocrine disorder, unspecified: Secondary | ICD-10-CM

## 2012-06-23 MED ORDER — TESTOSTERONE CYPIONATE 200 MG/ML IM SOLN
200.0000 mg | INTRAMUSCULAR | Status: DC
  Administered 2012-06-23: 200 mg via INTRAMUSCULAR

## 2012-06-23 NOTE — Patient Instructions (Signed)
Testosterone injection What is this medicine? TESTOSTERONE (tes TOS ter one) is the main male hormone. It supports normal male development such as muscle growth, facial hair, and deep voice. It is used in males to treat low testosterone levels. This medicine may be used for other purposes; ask your health care provider or pharmacist if you have questions. What should I tell my health care provider before I take this medicine? They need to know if you have any of these conditions: -breast cancer -diabetes -heart disease -kidney disease -liver disease -lung disease -prostate cancer, enlargement -an unusual or allergic reaction to testosterone, other medicines, foods, dyes, or preservatives -pregnant or trying to get pregnant -breast-feeding How should I use this medicine? This medicine is for injection into a muscle. It is usually given by a health care professional in a hospital or clinic setting. Contact your pediatrician regarding the use of this medicine in children. While this medicine may be prescribed for children as young as 49 years of age for selected conditions, precautions do apply. Overdosage: If you think you have taken too much of this medicine contact a poison control center or emergency room at once. NOTE: This medicine is only for you. Do not share this medicine with others. What if I miss a dose? Try not to miss a dose. Your doctor or health care professional will tell you when your next injection is due. Notify the office if you are unable to keep an appointment. What may interact with this medicine? -medicines for diabetes -medicines that treat or prevent blood clots like warfarin -oxyphenbutazone -propranolol -steroid medicines like prednisone or cortisone This list may not describe all possible interactions. Give your health care provider a list of all the medicines, herbs, non-prescription drugs, or dietary supplements you use. Also tell them if you smoke, drink  alcohol, or use illegal drugs. Some items may interact with your medicine. What should I watch for while using this medicine? Visit your doctor or health care professional for regular checks on your progress. They will need to check the level of testosterone in your blood. This medicine may affect blood sugar levels. If you have diabetes, check with your doctor or health care professional before you change your diet or the dose of your diabetic medicine. This drug is banned from use in athletes by most athletic organizations. What side effects may I notice from receiving this medicine? Side effects that you should report to your doctor or health care professional as soon as possible: -allergic reactions like skin rash, itching or hives, swelling of the face, lips, or tongue -breast enlargement -breathing problems -changes in mood, especially anger, depression, or rage -dark urine -general ill feeling or flu-like symptoms -light-colored stools -loss of appetite, nausea -nausea, vomiting -right upper belly pain -stomach pain -swelling of ankles -too frequent or persistent erections -trouble passing urine or change in the amount of urine -unusually weak or tired -yellowing of the eyes or skin Additional side effects that can occur in women include: -deep or hoarse voice -facial hair growth -irregular menstrual periods Side effects that usually do not require medical attention (report to your doctor or health care professional if they continue or are bothersome): -acne -change in sex drive or performance -hair loss -headache This list may not describe all possible side effects. Call your doctor for medical advice about side effects. You may report side effects to FDA at 1-800-FDA-1088. Where should I keep my medicine? Keep out of the reach of children. This medicine  can be abused. Keep your medicine in a safe place to protect it from theft. Do not share this medicine with anyone.  Selling or giving away this medicine is dangerous and against the law. Store at room temperature between 20 and 25 degrees C (68 and 77 degrees F). Do not freeze. Protect from light. Follow the directions for the product you are prescribed. Throw away any unused medicine after the expiration date. NOTE: This sheet is a summary. It may not cover all possible information. If you have questions about this medicine, talk to your doctor, pharmacist, or health care provider.  2012, [employer] Standard. (04/22/2007 4:13:46 PM)

## 2012-07-07 ENCOUNTER — Ambulatory Visit (HOSPITAL_BASED_OUTPATIENT_CLINIC_OR_DEPARTMENT_OTHER): Payer: Medicaid Other | Admitting: Lab

## 2012-07-07 ENCOUNTER — Ambulatory Visit (HOSPITAL_BASED_OUTPATIENT_CLINIC_OR_DEPARTMENT_OTHER): Payer: Medicaid Other | Admitting: Hematology & Oncology

## 2012-07-07 ENCOUNTER — Other Ambulatory Visit: Payer: Medicaid Other | Admitting: Lab

## 2012-07-07 ENCOUNTER — Ambulatory Visit (HOSPITAL_BASED_OUTPATIENT_CLINIC_OR_DEPARTMENT_OTHER): Payer: Medicaid Other

## 2012-07-07 VITALS — BP 144/79 | HR 80 | Temp 97.8°F | Resp 18 | Ht 69.0 in | Wt 196.0 lb

## 2012-07-07 DIAGNOSIS — E349 Endocrine disorder, unspecified: Secondary | ICD-10-CM

## 2012-07-07 DIAGNOSIS — C9000 Multiple myeloma not having achieved remission: Secondary | ICD-10-CM

## 2012-07-07 DIAGNOSIS — N289 Disorder of kidney and ureter, unspecified: Secondary | ICD-10-CM

## 2012-07-07 DIAGNOSIS — D509 Iron deficiency anemia, unspecified: Secondary | ICD-10-CM

## 2012-07-07 DIAGNOSIS — E291 Testicular hypofunction: Secondary | ICD-10-CM

## 2012-07-07 DIAGNOSIS — D649 Anemia, unspecified: Secondary | ICD-10-CM

## 2012-07-07 LAB — CBC WITH DIFFERENTIAL (CANCER CENTER ONLY)
BASO%: 0.4 % (ref 0.0–2.0)
EOS%: 3.8 % (ref 0.0–7.0)
Eosinophils Absolute: 0.4 10*3/uL (ref 0.0–0.5)
LYMPH%: 16 % (ref 14.0–48.0)
MCH: 25.5 pg — ABNORMAL LOW (ref 28.0–33.4)
MCV: 79 fL — ABNORMAL LOW (ref 82–98)
MONO%: 10.5 % (ref 0.0–13.0)
NEUT#: 6.8 10*3/uL — ABNORMAL HIGH (ref 1.5–6.5)
Platelets: 233 10*3/uL (ref 145–400)
RBC: 4.32 10*6/uL (ref 4.20–5.70)
RDW: 15 % (ref 11.1–15.7)

## 2012-07-07 MED ORDER — DARBEPOETIN ALFA-POLYSORBATE 300 MCG/0.6ML IJ SOLN: 300.0000 ug | Freq: Once | INTRAMUSCULAR | Status: DC

## 2012-07-07 MED ORDER — SODIUM CHLORIDE 0.9 % IV SOLN
60.0000 mg | Freq: Once | INTRAVENOUS | Status: AC
  Administered 2012-07-07: 60 mg via INTRAVENOUS
  Filled 2012-07-07: qty 20

## 2012-07-07 MED ORDER — HEPARIN SOD (PORK) LOCK FLUSH 100 UNIT/ML IV SOLN
500.0000 [IU] | Freq: Once | INTRAVENOUS | Status: AC
  Administered 2012-07-07: 500 [IU] via INTRAVENOUS
  Filled 2012-07-07: qty 5

## 2012-07-07 MED ORDER — SODIUM CHLORIDE 0.9 % IJ SOLN
10.0000 mL | Freq: Once | INTRAMUSCULAR | Status: AC
  Administered 2012-07-07: 10 mL via INTRAVENOUS
  Filled 2012-07-07: qty 10

## 2012-07-07 MED ORDER — TESTOSTERONE CYPIONATE 200 MG/ML IM SOLN
200.0000 mg | INTRAMUSCULAR | Status: DC
  Administered 2012-07-07: 200 mg via INTRAMUSCULAR

## 2012-07-07 NOTE — Patient Instructions (Addendum)
Pamidronate injection What is this medicine? PAMIDRONATE (pa mi DROE nate) slows calcium loss from bones. It is used to treat high calcium blood levels from cancer or Paget's disease. It is also used to treat bone pain and prevent fractures from certain cancers that have spread to the bone. This medicine may be used for other purposes; ask your health care provider or pharmacist if you have questions. What should I tell my health care provider before I take this medicine? They need to know if you have any of these conditions: -aspirin-sensitive asthma -dental disease -kidney disease -an unusual or allergic reaction to pamidronate, other medicines, foods, dyes, or preservatives -pregnant or trying to get pregnant -breast-feeding How should I use this medicine? This medicine is for infusion into a vein. It is given by a health care professional in a hospital or clinic setting. Talk to your pediatrician regarding the use of this medicine in children. This medicine is not approved for use in children. Overdosage: If you think you have taken too much of this medicine contact a poison control center or emergency room at once. NOTE: This medicine is only for you. Do not share this medicine with others. What if I miss a dose? This does not apply. What may interact with this medicine? -certain antibiotics given by injection -medicines for inflammation or pain like ibuprofen, naproxen -some diuretics like bumetanide, furosemide -cyclosporine -parathyroid hormone -tacrolimus -teriparatide -thalidomide This list may not describe all possible interactions. Give your health care provider a list of all the medicines, herbs, non-prescription drugs, or dietary supplements you use. Also tell them if you smoke, drink alcohol, or use illegal drugs. Some items may interact with your medicine. What should I watch for while using this medicine? Visit your doctor or health care professional for regular  checkups. It may be some time before you see the benefit from this medicine. Do not stop taking your medicine unless your doctor tells you to. Your doctor may order blood tests or other tests to see how you are doing. Women should inform their doctor if they wish to become pregnant or think they might be pregnant. There is a potential for serious side effects to an unborn child. Talk to your health care professional or pharmacist for more information. You should make sure that you get enough calcium and vitamin D while you are taking this medicine. Discuss the foods you eat and the vitamins you take with your health care professional. Some people who take this medicine have severe bone, joint, and/or muscle pain. This medicine may also increase your risk for a broken thigh bone. Tell your doctor right away if you have pain in your upper leg or groin. Tell your doctor if you have any pain that does not go away or that gets worse. What side effects may I notice from receiving this medicine? Side effects that you should report to your doctor or health care professional as soon as possible: -allergic reactions like skin rash, itching or hives, swelling of the face, lips, or tongue -black or tarry stools -changes in vision -eye inflammation, pain -high blood pressure -jaw pain, especially burning or cramping -muscle weakness -numb, tingling pain -swelling of feet or hands -trouble passing urine or change in the amount of urine -unable to move easily Side effects that usually do not require medical attention (report to your doctor or health care professional if they continue or are bothersome): -bone, joint, or muscle pain -constipation -dizzy, drowsy -fever -headache -loss of  appetite -nausea, vomiting -pain at site where injected This list may not describe all possible side effects. Call your doctor for medical advice about side effects. You may report side effects to FDA at  1-800-FDA-1088. Where should I keep my medicine? This drug is given in a hospital or clinic and will not be stored at home. NOTE: This sheet is a summary. It may not cover all possible information. If you have questions about this medicine, talk to your doctor, pharmacist, or health care provider.  2012, [employer]. (08/08/2010 8:49:49 AM)

## 2012-07-07 NOTE — Progress Notes (Signed)
This office note has been dictated.

## 2012-07-07 NOTE — Progress Notes (Signed)
Excellent blood return noted .

## 2012-07-08 ENCOUNTER — Telehealth: Payer: Self-pay | Admitting: Hematology & Oncology

## 2012-07-08 NOTE — Telephone Encounter (Signed)
Pt aware of 5-21 iron infusion

## 2012-07-11 LAB — KAPPA/LAMBDA LIGHT CHAINS
Kappa free light chain: 4.5 mg/dL — ABNORMAL HIGH (ref 0.33–1.94)
Kappa:Lambda Ratio: 1.36 (ref 0.26–1.65)
Lambda Free Lght Chn: 3.32 mg/dL — ABNORMAL HIGH (ref 0.57–2.63)

## 2012-07-11 LAB — PROTEIN ELECTROPHORESIS, SERUM, WITH REFLEX
Albumin ELP: 46.4 % — ABNORMAL LOW (ref 55.8–66.1)
Beta Globulin: 5.7 % (ref 4.7–7.2)
M-Spike, %: 0.6 g/dL
Total Protein, Serum Electrophoresis: 7.6 g/dL (ref 6.0–8.3)

## 2012-07-11 LAB — COMPREHENSIVE METABOLIC PANEL
ALT: <8 U/L (ref 0–53)
BUN: 20 mg/dL (ref 6–23)
CO2: 27 mEq/L (ref 19–32)
Calcium: 8.7 mg/dL (ref 8.4–10.5)
Chloride: 101 mEq/L (ref 96–112)
Creatinine, Ser: 2.21 mg/dL — ABNORMAL HIGH (ref 0.50–1.35)
Glucose, Bld: 117 mg/dL — ABNORMAL HIGH (ref 70–99)
Total Bilirubin: 0.4 mg/dL (ref 0.3–1.2)

## 2012-07-11 LAB — IRON AND TIBC: Iron: 32 ug/dL — ABNORMAL LOW (ref 42–165)

## 2012-07-11 LAB — FERRITIN: Ferritin: 851 ng/mL — ABNORMAL HIGH (ref 22–322)

## 2012-07-11 LAB — IGG, IGA, IGM: IgA: 401 mg/dL — ABNORMAL HIGH (ref 68–379)

## 2012-07-11 LAB — TESTOSTERONE: Testosterone: 500 ng/dL (ref 300–890)

## 2012-07-11 NOTE — Progress Notes (Signed)
CC:   Gala Romney, MD, Fax [phone]  DIAGNOSES: 1. IgG kappa myeloma, a very good partial remission. 2. Intermittent iron deficiency anemia. 3. Insulin dependent diabetes. 4. Hypotestosteronemia. 5. Anemia secondary to renal insufficiency.  CURRENT THERAPY: 1. Aredia 60 mg IV q.2 months. 2. Aranesp 300 mcg subcu as needed for hemoglobin less than 11. 3. IV iron as indicated, the patient will likely receive some next     week. 4. Depo-Testosterone 200 mg IM q.2 weeks.  INTERIM HISTORY:  Mr. Addis comes in for his followup.  He feels a little tired.  He has been feeling tired for the past couple weeks.  He has had no bleeding or bruising.  He has had no exacerbation of his fibromyalgia.  His diabetes has always been an issue for him.  He seems to be doing pretty well with trying to control this.  When we last saw him in March, his monoclonal spike was nondetectable.  His testosterone was 314 back in March.  PHYSICAL EXAMINATION:  General:  This is a fairly well-developed, well- nourished black gentleman in no obvious distress.  Vital signs:  Show temperature of 97.8, pulse 80, respiratory rate 18, blood pressure 144/79.  Weight is 196.  Head and neck:  Shows a normocephalic, atraumatic skull.  There are no ocular or oral lesions.  There are no palpable cervical or supraclavicular lymph nodes.  Lungs:  Clear bilaterally.  Cardiac:  Regular rate and rhythm with a normal S1, S2. There are no murmurs, rubs or bruits.  Abdomen:  Soft with good bowel sounds.  There is no palpable abdominal mass.  There is no fluid wave. There is no palpable hepatosplenomegaly.  Back:  No tenderness of the spine, ribs or hips.  Extremities:  Show no clubbing, cyanosis or edema. Neurological:  Shows no focal neurological deficits.  LABORATORY STUDIES:  White cell count is 9.8, hemoglobin 11, hematocrit 34.1, platelet count 233.  His testosterone level is 500.  His ferritin is 851 with an  iron saturation of almost 17%.  Iron is 32.  BUN is 20, creatinine 2.21.  IMPRESSION:  Mr. Redgate is a 62 year old gentleman with a history of IgG kappa myeloma.  We have not had to treat this for several years.  We are following this whenever we see him.  I do think he is probably iron deficient again.  His ferritin is always high because of his fibromyalgia.  Given his low saturation and decrease in iron, I believe iron deficiency is a problem right now.  We will get him set up with IV iron next week.  We will go ahead with the Aredia today.  He will get his testosterone shot today.  We will have him come back in another couple months, as typical, so that we can see how he is doing.  ADDENDUM:  Mr. Krolczyk did have an MRI of his spine back in March. There is nothing that suggested any type of cord compression.  He has some change in marrow signal which was nonspecific.  There were no fractures.  He had right disk protrusion at L4-5.    ______________________________ Volanda Napoleon, M.D. PRE/MEDQ  D:  07/07/2012  T:  07/08/2012  Job:  [id number]

## 2012-07-13 ENCOUNTER — Ambulatory Visit (HOSPITAL_BASED_OUTPATIENT_CLINIC_OR_DEPARTMENT_OTHER): Payer: Medicaid Other

## 2012-07-13 VITALS — BP 136/74 | HR 78 | Temp 98.5°F | Resp 18

## 2012-07-13 DIAGNOSIS — E349 Endocrine disorder, unspecified: Secondary | ICD-10-CM

## 2012-07-13 DIAGNOSIS — D509 Iron deficiency anemia, unspecified: Secondary | ICD-10-CM

## 2012-07-13 MED ORDER — SODIUM CHLORIDE 0.9 % IV SOLN
1020.0000 mg | Freq: Once | INTRAVENOUS | Status: AC
  Administered 2012-07-13: 1020 mg via INTRAVENOUS
  Filled 2012-07-13: qty 34

## 2012-07-13 NOTE — Patient Instructions (Signed)
Ferumoxytol injection What is this medicine? FERUMOXYTOL is an iron complex. Iron is used to make healthy red blood cells, which carry oxygen and nutrients throughout the body. This medicine is used to treat iron deficiency anemia in people with chronic kidney disease. This medicine may be used for other purposes; ask your health care provider or pharmacist if you have questions. What should I tell my health care provider before I take this medicine? They need to know if you have any of these conditions: -anemia not caused by low iron levels -high levels of iron in the blood -magnetic resonance imaging (MRI) test scheduled -an unusual or allergic reaction to iron, other medicines, foods, dyes, or preservatives -pregnant or trying to get pregnant -breast-feeding How should I use this medicine? This medicine is for infusion into a vein. It is given by a health care professional in a hospital or clinic setting. Talk to your pediatrician regarding the use of this medicine in children. Special care may be needed. Overdosage: If you think you've taken too much of this medicine contact a poison control center or emergency room at once. Overdosage: If you think you have taken too much of this medicine contact a poison control center or emergency room at once. NOTE: This medicine is only for you. Do not share this medicine with others. What if I miss a dose? It is important not to miss your dose. Call your doctor or health care professional if you are unable to keep an appointment. What may interact with this medicine? This medicine may interact with the following medications: -other iron products This list may not describe all possible interactions. Give your health care provider a list of all the medicines, herbs, non-prescription drugs, or dietary supplements you use. Also tell them if you smoke, drink alcohol, or use illegal drugs. Some items may interact with your medicine. What should I watch  for while using this medicine? Visit your doctor or healthcare professional regularly. Tell your doctor or healthcare professional if your symptoms do not start to get better or if they get worse. You may need blood work done while you are taking this medicine. You may need to follow a special diet. Talk to your doctor. Foods that contain iron include: whole grains/cereals, dried fruits, beans, or peas, leafy green vegetables, and organ meats (liver, kidney). What side effects may I notice from receiving this medicine? Side effects that you should report to your doctor or health care professional as soon as possible: -allergic reactions like skin rash, itching or hives, swelling of the face, lips, or tongue -breathing problems -changes in blood pressure -feeling faint or lightheaded, falls -fever or chills -flushing, sweating, or hot feelings -swelling of the ankles or feet Side effects that usually do not require medical attention (Report these to your doctor or health care professional if they continue or are bothersome.): -diarrhea -headache -nausea, vomiting -stomach pain This list may not describe all possible side effects. Call your doctor for medical advice about side effects. You may report side effects to FDA at 1-800-FDA-1088. Where should I keep my medicine? This drug is given in a hospital or clinic and will not be stored at home. NOTE: This sheet is a summary. It may not cover all possible information. If you have questions about this medicine, talk to your doctor, pharmacist, or health care provider.  2012, [employer]vier/Gold Standard. (11/02/2007 9:48:25 PM) 

## 2012-07-14 ENCOUNTER — Ambulatory Visit: Payer: Medicaid Other

## 2012-07-14 ENCOUNTER — Ambulatory Visit: Payer: Medicaid Other | Admitting: Hematology & Oncology

## 2012-07-14 ENCOUNTER — Other Ambulatory Visit: Payer: Medicaid Other | Admitting: Lab

## 2012-07-20 ENCOUNTER — Ambulatory Visit: Payer: Medicaid Other

## 2012-07-20 ENCOUNTER — Other Ambulatory Visit (HOSPITAL_BASED_OUTPATIENT_CLINIC_OR_DEPARTMENT_OTHER): Payer: Medicaid Other | Admitting: Lab

## 2012-07-20 DIAGNOSIS — C9000 Multiple myeloma not having achieved remission: Secondary | ICD-10-CM

## 2012-07-20 LAB — CBC WITH DIFFERENTIAL (CANCER CENTER ONLY)
BASO#: 0 10*3/uL (ref 0.0–0.2)
EOS%: 3.3 % (ref 0.0–7.0)
Eosinophils Absolute: 0.3 10*3/uL (ref 0.0–0.5)
HGB: 12.5 g/dL — ABNORMAL LOW (ref 13.0–17.1)
LYMPH%: 23.1 % (ref 14.0–48.0)
MCH: 25.5 pg — ABNORMAL LOW (ref 28.0–33.4)
MCHC: 32.6 g/dL (ref 32.0–35.9)
MCV: 78 fL — ABNORMAL LOW (ref 82–98)
MONO%: 11.6 % (ref 0.0–13.0)
Platelets: 297 10*3/uL (ref 145–400)
RBC: 4.9 10*6/uL (ref 4.20–5.70)

## 2012-07-20 NOTE — Progress Notes (Signed)
Hgb 12.5 no Aranesp today

## 2012-08-01 ENCOUNTER — Other Ambulatory Visit: Payer: Self-pay | Admitting: *Deleted

## 2012-08-02 ENCOUNTER — Telehealth: Payer: Self-pay | Admitting: Hematology & Oncology

## 2012-08-02 NOTE — Telephone Encounter (Signed)
Unable to reach pt to let him know 6-12 infusion changed to injection. Home phone no answer or voice mail.

## 2012-08-03 ENCOUNTER — Other Ambulatory Visit: Payer: Self-pay | Admitting: *Deleted

## 2012-08-03 DIAGNOSIS — N189 Chronic kidney disease, unspecified: Secondary | ICD-10-CM

## 2012-08-03 DIAGNOSIS — D509 Iron deficiency anemia, unspecified: Secondary | ICD-10-CM

## 2012-08-03 DIAGNOSIS — C9000 Multiple myeloma not having achieved remission: Secondary | ICD-10-CM

## 2012-08-04 ENCOUNTER — Ambulatory Visit: Payer: Medicaid Other

## 2012-08-04 ENCOUNTER — Ambulatory Visit (HOSPITAL_BASED_OUTPATIENT_CLINIC_OR_DEPARTMENT_OTHER): Payer: Medicaid Other

## 2012-08-04 ENCOUNTER — Other Ambulatory Visit (HOSPITAL_BASED_OUTPATIENT_CLINIC_OR_DEPARTMENT_OTHER): Payer: Medicaid Other | Admitting: Lab

## 2012-08-04 VITALS — BP 182/89 | HR 87 | Temp 98.5°F | Resp 20

## 2012-08-04 DIAGNOSIS — E291 Testicular hypofunction: Secondary | ICD-10-CM

## 2012-08-04 DIAGNOSIS — D509 Iron deficiency anemia, unspecified: Secondary | ICD-10-CM

## 2012-08-04 DIAGNOSIS — C9000 Multiple myeloma not having achieved remission: Secondary | ICD-10-CM

## 2012-08-04 DIAGNOSIS — D631 Anemia in chronic kidney disease: Secondary | ICD-10-CM

## 2012-08-04 DIAGNOSIS — E349 Endocrine disorder, unspecified: Secondary | ICD-10-CM

## 2012-08-04 LAB — CBC WITH DIFFERENTIAL (CANCER CENTER ONLY)
BASO%: 0.5 % (ref 0.0–2.0)
EOS%: 3.7 % (ref 0.0–7.0)
LYMPH%: 12.6 % — ABNORMAL LOW (ref 14.0–48.0)
MCH: 25.3 pg — ABNORMAL LOW (ref 28.0–33.4)
MCV: 77 fL — ABNORMAL LOW (ref 82–98)
MONO%: 7.3 % (ref 0.0–13.0)
Platelets: 215 10*3/uL (ref 145–400)
RDW: 15.8 % — ABNORMAL HIGH (ref 11.1–15.7)

## 2012-08-04 MED ORDER — TESTOSTERONE CYPIONATE 200 MG/ML IM SOLN
200.0000 mg | INTRAMUSCULAR | Status: DC
  Administered 2012-08-04: 200 mg via INTRAMUSCULAR

## 2012-08-04 NOTE — Patient Instructions (Signed)
Testosterone injection What is this medicine? TESTOSTERONE (tes TOS ter one) is the main male hormone. It supports normal male development such as muscle growth, facial hair, and deep voice. It is used in males to treat low testosterone levels. This medicine may be used for other purposes; ask your health care provider or pharmacist if you have questions. What should I tell my health care provider before I take this medicine? They need to know if you have any of these conditions: -breast cancer -diabetes -heart disease -kidney disease -liver disease -lung disease -prostate cancer, enlargement -an unusual or allergic reaction to testosterone, other medicines, foods, dyes, or preservatives -pregnant or trying to get pregnant -breast-feeding How should I use this medicine? This medicine is for injection into a muscle. It is usually given by a health care professional in a hospital or clinic setting. Contact your pediatrician regarding the use of this medicine in children. While this medicine may be prescribed for children as young as 49 years of age for selected conditions, precautions do apply. Overdosage: If you think you have taken too much of this medicine contact a poison control center or emergency room at once. NOTE: This medicine is only for you. Do not share this medicine with others. What if I miss a dose? Try not to miss a dose. Your doctor or health care professional will tell you when your next injection is due. Notify the office if you are unable to keep an appointment. What may interact with this medicine? -medicines for diabetes -medicines that treat or prevent blood clots like warfarin -oxyphenbutazone -propranolol -steroid medicines like prednisone or cortisone This list may not describe all possible interactions. Give your health care provider a list of all the medicines, herbs, non-prescription drugs, or dietary supplements you use. Also tell them if you smoke, drink  alcohol, or use illegal drugs. Some items may interact with your medicine. What should I watch for while using this medicine? Visit your doctor or health care professional for regular checks on your progress. They will need to check the level of testosterone in your blood. This medicine may affect blood sugar levels. If you have diabetes, check with your doctor or health care professional before you change your diet or the dose of your diabetic medicine. This drug is banned from use in athletes by most athletic organizations. What side effects may I notice from receiving this medicine? Side effects that you should report to your doctor or health care professional as soon as possible: -allergic reactions like skin rash, itching or hives, swelling of the face, lips, or tongue -breast enlargement -breathing problems -changes in mood, especially anger, depression, or rage -dark urine -general ill feeling or flu-like symptoms -light-colored stools -loss of appetite, nausea -nausea, vomiting -right upper belly pain -stomach pain -swelling of ankles -too frequent or persistent erections -trouble passing urine or change in the amount of urine -unusually weak or tired -yellowing of the eyes or skin Additional side effects that can occur in women include: -deep or hoarse voice -facial hair growth -irregular menstrual periods Side effects that usually do not require medical attention (report to your doctor or health care professional if they continue or are bothersome): -acne -change in sex drive or performance -hair loss -headache This list may not describe all possible side effects. Call your doctor for medical advice about side effects. You may report side effects to FDA at 1-800-FDA-1088. Where should I keep my medicine? Keep out of the reach of children. This medicine  can be abused. Keep your medicine in a safe place to protect it from theft. Do not share this medicine with anyone.  Selling or giving away this medicine is dangerous and against the law. Store at room temperature between 20 and 25 degrees C (68 and 77 degrees F). Do not freeze. Protect from light. Follow the directions for the product you are prescribed. Throw away any unused medicine after the expiration date. NOTE: This sheet is a summary. It may not cover all possible information. If you have questions about this medicine, talk to your doctor, pharmacist, or health care provider.  2013, [employer]. (04/22/2007 4:13:46 PM)

## 2012-08-17 ENCOUNTER — Ambulatory Visit (HOSPITAL_BASED_OUTPATIENT_CLINIC_OR_DEPARTMENT_OTHER): Payer: Medicaid Other

## 2012-08-17 ENCOUNTER — Ambulatory Visit (HOSPITAL_BASED_OUTPATIENT_CLINIC_OR_DEPARTMENT_OTHER): Payer: Medicaid Other | Admitting: Lab

## 2012-08-17 VITALS — BP 154/89 | HR 89 | Temp 97.1°F | Resp 18

## 2012-08-17 DIAGNOSIS — C9000 Multiple myeloma not having achieved remission: Secondary | ICD-10-CM

## 2012-08-17 DIAGNOSIS — E291 Testicular hypofunction: Secondary | ICD-10-CM

## 2012-08-17 DIAGNOSIS — E349 Endocrine disorder, unspecified: Secondary | ICD-10-CM

## 2012-08-17 LAB — CBC WITH DIFFERENTIAL (CANCER CENTER ONLY)
BASO#: 0.1 10*3/uL (ref 0.0–0.2)
Eosinophils Absolute: 0.3 10*3/uL (ref 0.0–0.5)
HGB: 12.7 g/dL — ABNORMAL LOW (ref 13.0–17.1)
LYMPH#: 1.4 10*3/uL (ref 0.9–3.3)
MCH: 25.5 pg — ABNORMAL LOW (ref 28.0–33.4)
MONO#: 0.8 10*3/uL (ref 0.1–0.9)
MONO%: 8.4 % (ref 0.0–13.0)
NEUT#: 6.4 10*3/uL (ref 1.5–6.5)
RBC: 4.98 10*6/uL (ref 4.20–5.70)
WBC: 8.9 10*3/uL (ref 4.0–10.0)

## 2012-08-17 MED ORDER — TESTOSTERONE CYPIONATE 200 MG/ML IM SOLN
200.0000 mg | INTRAMUSCULAR | Status: DC
  Administered 2012-08-17: 200 mg via INTRAMUSCULAR

## 2012-08-17 NOTE — Addendum Note (Signed)
Addended by: Enriqueta Shutter on: 08/17/2012 08:29 AM   Modules accepted: Orders

## 2012-08-23 ENCOUNTER — Emergency Department: Payer: Self-pay | Admitting: Emergency Medicine

## 2012-08-23 LAB — COMPREHENSIVE METABOLIC PANEL WITH GFR
Albumin: 3.5 g/dL
Alkaline Phosphatase: 101 U/L
Anion Gap: 4 — ABNORMAL LOW
BUN: 23 mg/dL — ABNORMAL HIGH
Bilirubin,Total: 0.2 mg/dL
Calcium, Total: 8.7 mg/dL
Chloride: 101 mmol/L
Co2: 32 mmol/L
Creatinine: 2.53 mg/dL — ABNORMAL HIGH
EGFR (African American): 30 — ABNORMAL LOW
EGFR (Non-African Amer.): 26 — ABNORMAL LOW
Glucose: 240 mg/dL — ABNORMAL HIGH
Osmolality: 285
Potassium: 4 mmol/L
SGOT(AST): 27 U/L
SGPT (ALT): 21 U/L
Sodium: 137 mmol/L
Total Protein: 8.2 g/dL

## 2012-08-23 LAB — CBC
HCT: 36.9 % — ABNORMAL LOW
HGB: 12 g/dL — ABNORMAL LOW
MCH: 25.1 pg — ABNORMAL LOW
MCHC: 32.5 g/dL
MCV: 77 fL — ABNORMAL LOW
Platelet: 212 x10 3/mm 3
RBC: 4.79 x10 6/mm 3
RDW: 15.8 % — ABNORMAL HIGH
WBC: 8.4 x10 3/mm 3

## 2012-08-23 LAB — URINALYSIS, COMPLETE
Blood: NEGATIVE
Glucose,UR: NEGATIVE mg/dL
Hyaline Cast: 1
Ketone: NEGATIVE
Nitrite: NEGATIVE
Ph: 7
Protein: 30
RBC,UR: 5 /HPF
Specific Gravity: 1.021
Squamous Epithelial: 1
WBC UR: 4 /HPF

## 2012-08-24 LAB — TROPONIN I: Troponin-I: <0.02 ng/mL

## 2012-08-24 LAB — CK TOTAL AND CKMB (NOT AT ARMC): CK-MB: 3.8 ng/mL — ABNORMAL HIGH (ref 0.5–3.6)

## 2012-08-25 LAB — URINE CULTURE

## 2012-09-01 ENCOUNTER — Ambulatory Visit (HOSPITAL_BASED_OUTPATIENT_CLINIC_OR_DEPARTMENT_OTHER): Payer: Medicaid Other | Admitting: Hematology & Oncology

## 2012-09-01 ENCOUNTER — Ambulatory Visit (HOSPITAL_BASED_OUTPATIENT_CLINIC_OR_DEPARTMENT_OTHER): Payer: Medicaid Other

## 2012-09-01 ENCOUNTER — Ambulatory Visit (HOSPITAL_BASED_OUTPATIENT_CLINIC_OR_DEPARTMENT_OTHER): Payer: Medicaid Other | Admitting: Lab

## 2012-09-01 ENCOUNTER — Telehealth: Payer: Self-pay | Admitting: Hematology & Oncology

## 2012-09-01 ENCOUNTER — Encounter: Payer: Self-pay | Admitting: Hematology & Oncology

## 2012-09-01 VITALS — BP 168/75 | HR 90 | Temp 98.1°F | Resp 18 | Ht 69.0 in | Wt 195.0 lb

## 2012-09-01 DIAGNOSIS — E349 Endocrine disorder, unspecified: Secondary | ICD-10-CM

## 2012-09-01 DIAGNOSIS — D509 Iron deficiency anemia, unspecified: Secondary | ICD-10-CM

## 2012-09-01 DIAGNOSIS — D631 Anemia in chronic kidney disease: Secondary | ICD-10-CM

## 2012-09-01 DIAGNOSIS — E291 Testicular hypofunction: Secondary | ICD-10-CM

## 2012-09-01 DIAGNOSIS — C9000 Multiple myeloma not having achieved remission: Secondary | ICD-10-CM

## 2012-09-01 LAB — IRON AND TIBC CHCC
Iron: 64 ug/dL (ref 42–163)
UIBC: 150 ug/dL (ref 117–376)

## 2012-09-01 LAB — CBC WITH DIFFERENTIAL (CANCER CENTER ONLY)
BASO#: 0.1 10*3/uL (ref 0.0–0.2)
Eosinophils Absolute: 0.3 10*3/uL (ref 0.0–0.5)
HCT: 39 % (ref 38.7–49.9)
HGB: 13 g/dL (ref 13.0–17.1)
LYMPH%: 31.9 % (ref 14.0–48.0)
MCH: 25.4 pg — ABNORMAL LOW (ref 28.0–33.4)
MCV: 76 fL — ABNORMAL LOW (ref 82–98)
MONO#: 1.3 10*3/uL — ABNORMAL HIGH (ref 0.1–0.9)
MONO%: 12.5 % (ref 0.0–13.0)
NEUT%: 51.8 % (ref 40.0–80.0)
RBC: 5.11 10*6/uL (ref 4.20–5.70)

## 2012-09-01 LAB — FERRITIN CHCC: Ferritin: 1184 ng/ml — ABNORMAL HIGH (ref 22–316)

## 2012-09-01 MED ORDER — TESTOSTERONE CYPIONATE 200 MG/ML IM SOLN
200.0000 mg | INTRAMUSCULAR | Status: DC
  Administered 2012-09-01: 200 mg via INTRAMUSCULAR

## 2012-09-01 MED ORDER — HEPARIN SOD (PORK) LOCK FLUSH 100 UNIT/ML IV SOLN
500.0000 [IU] | Freq: Once | INTRAVENOUS | Status: AC
  Administered 2012-09-01: 500 [IU] via INTRAVENOUS
  Filled 2012-09-01: qty 5

## 2012-09-01 MED ORDER — SODIUM CHLORIDE 0.9 % IV SOLN
Freq: Once | INTRAVENOUS | Status: AC
  Administered 2012-09-01: 11:00:00 via INTRAVENOUS

## 2012-09-01 MED ORDER — PAMIDRONATE DISODIUM 30 MG/10ML IV SOLN
60.0000 mg | Freq: Once | INTRAVENOUS | Status: AC
  Administered 2012-09-01: 60 mg via INTRAVENOUS
  Filled 2012-09-01: qty 10

## 2012-09-01 MED ORDER — DARBEPOETIN ALFA-POLYSORBATE 300 MCG/0.6ML IJ SOLN: 300.0000 ug | Freq: Once | INTRAMUSCULAR | Status: DC

## 2012-09-01 MED ORDER — SODIUM CHLORIDE 0.9 % IJ SOLN
10.0000 mL | INTRAMUSCULAR | Status: DC | PRN
  Administered 2012-09-01: 10 mL via INTRAVENOUS
  Filled 2012-09-01: qty 10

## 2012-09-01 NOTE — Telephone Encounter (Signed)
Pt aware mailed 9-11 schedule

## 2012-09-01 NOTE — Patient Instructions (Addendum)
Pamidronate injection What is this medicine? PAMIDRONATE (pa mi DROE nate) slows calcium loss from bones. It is used to treat high calcium blood levels from cancer or Paget's disease. It is also used to treat bone pain and prevent fractures from certain cancers that have spread to the bone. This medicine may be used for other purposes; ask your health care provider or pharmacist if you have questions. What should I tell my health care provider before I take this medicine? They need to know if you have any of these conditions: -aspirin-sensitive asthma -dental disease -kidney disease -an unusual or allergic reaction to pamidronate, other medicines, foods, dyes, or preservatives -pregnant or trying to get pregnant -breast-feeding How should I use this medicine? This medicine is for infusion into a vein. It is given by a health care professional in a hospital or clinic setting. Talk to your pediatrician regarding the use of this medicine in children. This medicine is not approved for use in children. Overdosage: If you think you have taken too much of this medicine contact a poison control center or emergency room at once. NOTE: This medicine is only for you. Do not share this medicine with others. What if I miss a dose? This does not apply. What may interact with this medicine? -certain antibiotics given by injection -medicines for inflammation or pain like ibuprofen, naproxen -some diuretics like bumetanide, furosemide -cyclosporine -parathyroid hormone -tacrolimus -teriparatide -thalidomide This list may not describe all possible interactions. Give your health care provider a list of all the medicines, herbs, non-prescription drugs, or dietary supplements you use. Also tell them if you smoke, drink alcohol, or use illegal drugs. Some items may interact with your medicine. What should I watch for while using this medicine? Visit your doctor or health care professional for regular  checkups. It may be some time before you see the benefit from this medicine. Do not stop taking your medicine unless your doctor tells you to. Your doctor may order blood tests or other tests to see how you are doing. Women should inform their doctor if they wish to become pregnant or think they might be pregnant. There is a potential for serious side effects to an unborn child. Talk to your health care professional or pharmacist for more information. You should make sure that you get enough calcium and vitamin D while you are taking this medicine. Discuss the foods you eat and the vitamins you take with your health care professional. Some people who take this medicine have severe bone, joint, and/or muscle pain. This medicine may also increase your risk for a broken thigh bone. Tell your doctor right away if you have pain in your upper leg or groin. Tell your doctor if you have any pain that does not go away or that gets worse. What side effects may I notice from receiving this medicine? Side effects that you should report to your doctor or health care professional as soon as possible: -allergic reactions like skin rash, itching or hives, swelling of the face, lips, or tongue -black or tarry stools -changes in vision -eye inflammation, pain -high blood pressure -jaw pain, especially burning or cramping -muscle weakness -numb, tingling pain -swelling of feet or hands -trouble passing urine or change in the amount of urine -unable to move easily Side effects that usually do not require medical attention (report to your doctor or health care professional if they continue or are bothersome): -bone, joint, or muscle pain -constipation -dizzy, drowsy -fever -headache -loss of  appetite -nausea, vomiting -pain at site where injected This list may not describe all possible side effects. Call your doctor for medical advice about side effects. You may report side effects to FDA at  1-800-FDA-1088. Where should I keep my medicine? This drug is given in a hospital or clinic and will not be stored at home. NOTE: This sheet is a summary. It may not cover all possible information. If you have questions about this medicine, talk to your doctor, pharmacist, or health care provider.  2012, [employer]. (08/08/2010 8:49:49 AM)Testosterone injection What is this medicine? TESTOSTERONE (tes TOS ter one) is the main male hormone. It supports normal male development such as muscle growth, facial hair, and deep voice. It is used in males to treat low testosterone levels. This medicine may be used for other purposes; ask your health care provider or pharmacist if you have questions. What should I tell my health care provider before I take this medicine? They need to know if you have any of these conditions: -breast cancer -diabetes -heart disease -kidney disease -liver disease -lung disease -prostate cancer, enlargement -an unusual or allergic reaction to testosterone, other medicines, foods, dyes, or preservatives -pregnant or trying to get pregnant -breast-feeding How should I use this medicine? This medicine is for injection into a muscle. It is usually given by a health care professional in a hospital or clinic setting. Contact your pediatrician regarding the use of this medicine in children. While this medicine may be prescribed for children as young as 72 years of age for selected conditions, precautions do apply. Overdosage: If you think you have taken too much of this medicine contact a poison control center or emergency room at once. NOTE: This medicine is only for you. Do not share this medicine with others. What if I miss a dose? Try not to miss a dose. Your doctor or health care professional will tell you when your next injection is due. Notify the office if you are unable to keep an appointment. What may interact with this medicine? -medicines for  diabetes -medicines that treat or prevent blood clots like warfarin -oxyphenbutazone -propranolol -steroid medicines like prednisone or cortisone This list may not describe all possible interactions. Give your health care provider a list of all the medicines, herbs, non-prescription drugs, or dietary supplements you use. Also tell them if you smoke, drink alcohol, or use illegal drugs. Some items may interact with your medicine. What should I watch for while using this medicine? Visit your doctor or health care professional for regular checks on your progress. They will need to check the level of testosterone in your blood. This medicine may affect blood sugar levels. If you have diabetes, check with your doctor or health care professional before you change your diet or the dose of your diabetic medicine. This drug is banned from use in athletes by most athletic organizations. What side effects may I notice from receiving this medicine? Side effects that you should report to your doctor or health care professional as soon as possible: -allergic reactions like skin rash, itching or hives, swelling of the face, lips, or tongue -breast enlargement -breathing problems -changes in mood, especially anger, depression, or rage -dark urine -general ill feeling or flu-like symptoms -light-colored stools -loss of appetite, nausea -nausea, vomiting -right upper belly pain -stomach pain -swelling of ankles -too frequent or persistent erections -trouble passing urine or change in the amount of urine -unusually weak or tired -yellowing of the eyes or skin Additional  side effects that can occur in women include: -deep or hoarse voice -facial hair growth -irregular menstrual periods Side effects that usually do not require medical attention (report to your doctor or health care professional if they continue or are bothersome): -acne -change in sex drive or performance -hair loss -headache This  list may not describe all possible side effects. Call your doctor for medical advice about side effects. You may report side effects to FDA at 1-800-FDA-1088. Where should I keep my medicine? Keep out of the reach of children. This medicine can be abused. Keep your medicine in a safe place to protect it from theft. Do not share this medicine with anyone. Selling or giving away this medicine is dangerous and against the law. Store at room temperature between 20 and 25 degrees C (68 and 77 degrees F). Do not freeze. Protect from light. Follow the directions for the product you are prescribed. Throw away any unused medicine after the expiration date. NOTE: This sheet is a summary. It may not cover all possible information. If you have questions about this medicine, talk to your doctor, pharmacist, or health care provider.  2013, [employer]. (04/22/2007 4:13:46 PM)

## 2012-09-02 NOTE — Progress Notes (Signed)
This office note has been dictated.

## 2012-09-05 LAB — COMPREHENSIVE METABOLIC PANEL
Alkaline Phosphatase: 80 U/L (ref 39–117)
BUN: 19 mg/dL (ref 6–23)
CO2: 27 mEq/L (ref 19–32)
Glucose, Bld: 51 mg/dL — ABNORMAL LOW (ref 70–99)
Total Bilirubin: 0.3 mg/dL (ref 0.3–1.2)

## 2012-09-05 LAB — PROTEIN ELECTROPHORESIS, SERUM, WITH REFLEX
Alpha-1-Globulin: 3.7 % (ref 2.9–4.9)
Alpha-2-Globulin: 12.8 % — ABNORMAL HIGH (ref 7.1–11.8)
Beta 2: 6.3 % (ref 3.2–6.5)
Gamma Globulin: 23.4 % — ABNORMAL HIGH (ref 11.1–18.8)
M-Spike, %: 0.66 g/dL

## 2012-09-05 LAB — IFE INTERPRETATION

## 2012-09-05 LAB — KAPPA/LAMBDA LIGHT CHAINS
Kappa:Lambda Ratio: 1.19 (ref 0.26–1.65)
Lambda Free Lght Chn: 2.72 mg/dL — ABNORMAL HIGH (ref 0.57–2.63)

## 2012-09-05 LAB — IGG, IGA, IGM
IgA: 450 mg/dL — ABNORMAL HIGH (ref 68–379)
IgG (Immunoglobin G), Serum: 1920 mg/dL — ABNORMAL HIGH (ref 650–1600)
IgM, Serum: 81 mg/dL (ref 41–251)

## 2012-09-05 LAB — TESTOSTERONE: Testosterone: 706 ng/dL (ref 300–890)

## 2012-09-05 NOTE — Progress Notes (Signed)
CC:   Gala Romney, MD, Fax [phone]  DIAGNOSES: 1. IgG kappa myeloma, a very good partial remission. 2. Anemia secondary to renal insufficiency. 3. Intermittent iron deficiency anemia. 4. Hypotestosteronemia.  CURRENT THERAPY: 1. Depo-Testosterone 200 mg IM q.2 weeks. 2. IV iron as indicated. 3. Aranesp 300 mcg subcu as needed for hemoglobin less than 11. 4. Aredia 60 mg IV q.2 months.  INTERIM HISTORY:  [name] comes in for his followup.  He is not feeling all that well.  He does feel tired.  He feels worn out.  He has had no increase in pain issues.  He has occasional chest and abdominal wall pain.  When we last saw him, his monoclonal spike was 0.6 mg/dL.  We are following this today.  His last iron studies did show some iron deficiency.  Iron saturation was only 17%.  I felt that he needed to get IV iron.  He got some iron back in April.  He has had no issues with nausea or vomiting.  He has had no leg swelling.  There have been no rashes.  He has had no headache.  PHYSICAL EXAMINATION:  General:  This is a well-developed, well- nourished black gentleman in no obvious distress.  Vital signs: Temperature is 98.1, pulse 90, respiratory rate 18, and blood pressure 168/75.  Weight is 195.  Head and neck:  Normocephalic, atraumatic skull.  There are no ocular or oral lesions.  There are no palpable cervical or supraclavicular lymph nodes.  Lungs:  Clear bilaterally. Cardiac:  Regular rate and rhythm with a normal S1, S2.  There are no murmurs, rubs, or bruits.  Abdomen:  Soft with good bowel sounds.  There is no palpable abdominal mass.  There is no fluid wave.  There is no palpable hepatosplenomegaly.  Extremities:  No clubbing, cyanosis, or edema.  He has good strength in his legs.  Skin:  No rashes, ecchymosis, or petechia.  LABORATORY STUDIES:  White cell count is 10.5, hemoglobin 13, hematocrit 39, and platelet count 229.  His sodium is 139, potassium 3.6, BUN  19, and creatinine 1.82.  Ferritin is 1200 with an iron saturation of 30%.  His IgG level is 1920 mg/dL.  IMPRESSION:  [name] is a 62 year old gentleman with a history of IgG kappa myeloma.  We will continue to follow along his monoclonal spike.  His last monoclonal spike was detectable, which it previously had not been.  I just do not believe that his achiness and other health issues are related to any myeloma recurring.  He is on quite a few medications.  He is diabetic.  He may have some neuropathic-type issues.  I would not think that he needs any iron.  His iron saturation is always a good measure for him.  We will go ahead and plan to get him back to see [place] in another couple months.  I do not see that we need any blood work in between visits.  He may come in next month for his testosterone injection.    ______________________________ [name], M.D. PRE/MEDQ  D:  09/02/2012  T:  09/03/2012  Job:  [id number]

## 2012-11-03 ENCOUNTER — Ambulatory Visit (HOSPITAL_BASED_OUTPATIENT_CLINIC_OR_DEPARTMENT_OTHER): Payer: Medicaid Other | Admitting: Hematology & Oncology

## 2012-11-03 ENCOUNTER — Ambulatory Visit (HOSPITAL_BASED_OUTPATIENT_CLINIC_OR_DEPARTMENT_OTHER): Payer: Medicaid Other | Admitting: Lab

## 2012-11-03 ENCOUNTER — Ambulatory Visit (HOSPITAL_BASED_OUTPATIENT_CLINIC_OR_DEPARTMENT_OTHER): Payer: Medicaid Other

## 2012-11-03 VITALS — BP 156/67 | HR 72 | Temp 98.0°F | Resp 18 | Wt 192.0 lb

## 2012-11-03 DIAGNOSIS — D631 Anemia in chronic kidney disease: Secondary | ICD-10-CM

## 2012-11-03 DIAGNOSIS — E291 Testicular hypofunction: Secondary | ICD-10-CM

## 2012-11-03 DIAGNOSIS — D509 Iron deficiency anemia, unspecified: Secondary | ICD-10-CM

## 2012-11-03 DIAGNOSIS — C9000 Multiple myeloma not having achieved remission: Secondary | ICD-10-CM

## 2012-11-03 DIAGNOSIS — E349 Endocrine disorder, unspecified: Secondary | ICD-10-CM

## 2012-11-03 DIAGNOSIS — N289 Disorder of kidney and ureter, unspecified: Secondary | ICD-10-CM

## 2012-11-03 DIAGNOSIS — D649 Anemia, unspecified: Secondary | ICD-10-CM

## 2012-11-03 LAB — IRON AND TIBC CHCC
TIBC: 184 ug/dL — ABNORMAL LOW (ref 202–409)
UIBC: 50 ug/dL — ABNORMAL LOW (ref 117–376)

## 2012-11-03 LAB — CBC WITH DIFFERENTIAL (CANCER CENTER ONLY)
BASO#: 0.1 10*3/uL (ref 0.0–0.2)
EOS%: 3.7 % (ref 0.0–7.0)
HGB: 12.6 g/dL — ABNORMAL LOW (ref 13.0–17.1)
LYMPH#: 2.6 10*3/uL (ref 0.9–3.3)
MCHC: 32.7 g/dL (ref 32.0–35.9)
NEUT#: 5 10*3/uL (ref 1.5–6.5)
RBC: 4.99 10*6/uL (ref 4.20–5.70)

## 2012-11-03 LAB — FERRITIN CHCC: Ferritin: 2047 ng/ml — ABNORMAL HIGH (ref 22–316)

## 2012-11-03 MED ORDER — TESTOSTERONE CYPIONATE 200 MG/ML IM SOLN
200.0000 mg | INTRAMUSCULAR | Status: DC
  Administered 2012-11-03: 200 mg via INTRAMUSCULAR

## 2012-11-03 MED ORDER — SODIUM CHLORIDE 0.9 % IJ SOLN
10.0000 mL | INTRAMUSCULAR | Status: DC | PRN
  Administered 2012-11-03: 10 mL via INTRAVENOUS
  Filled 2012-11-03: qty 10

## 2012-11-03 MED ORDER — HEPARIN SOD (PORK) LOCK FLUSH 100 UNIT/ML IV SOLN
500.0000 [IU] | Freq: Once | INTRAVENOUS | Status: AC
  Administered 2012-11-03: 500 [IU] via INTRAVENOUS
  Filled 2012-11-03: qty 5

## 2012-11-03 MED ORDER — SODIUM CHLORIDE 0.9 % IV SOLN
Freq: Once | INTRAVENOUS | Status: AC
  Administered 2012-11-03: 09:00:00 via INTRAVENOUS

## 2012-11-03 MED ORDER — SODIUM CHLORIDE 0.9 % IV SOLN
60.0000 mg | Freq: Once | INTRAVENOUS | Status: AC
  Administered 2012-11-03: 60 mg via INTRAVENOUS
  Filled 2012-11-03: qty 10

## 2012-11-03 NOTE — Progress Notes (Signed)
This office note has been dictated.

## 2012-11-03 NOTE — Patient Instructions (Signed)
Pamidronate injection What is this medicine? PAMIDRONATE (pa mi DROE nate) slows calcium loss from bones. It is used to treat high calcium blood levels from cancer or Paget's disease. It is also used to treat bone pain and prevent fractures from certain cancers that have spread to the bone. This medicine may be used for other purposes; ask your health care provider or pharmacist if you have questions. What should I tell my health care provider before I take this medicine? They need to know if you have any of these conditions: -aspirin-sensitive asthma -dental disease -kidney disease -an unusual or allergic reaction to pamidronate, other medicines, foods, dyes, or preservatives -pregnant or trying to get pregnant -breast-feeding How should I use this medicine? This medicine is for infusion into a vein. It is given by a health care professional in a hospital or clinic setting. Talk to your pediatrician regarding the use of this medicine in children. This medicine is not approved for use in children. Overdosage: If you think you have taken too much of this medicine contact a poison control center or emergency room at once. NOTE: This medicine is only for you. Do not share this medicine with others. What if I miss a dose? This does not apply. What may interact with this medicine? -certain antibiotics given by injection -medicines for inflammation or pain like ibuprofen, naproxen -some diuretics like bumetanide, furosemide -cyclosporine -parathyroid hormone -tacrolimus -teriparatide -thalidomide This list may not describe all possible interactions. Give your health care provider a list of all the medicines, herbs, non-prescription drugs, or dietary supplements you use. Also tell them if you smoke, drink alcohol, or use illegal drugs. Some items may interact with your medicine. What should I watch for while using this medicine? Visit your doctor or health care professional for regular  checkups. It may be some time before you see the benefit from this medicine. Do not stop taking your medicine unless your doctor tells you to. Your doctor may order blood tests or other tests to see how you are doing. Women should inform their doctor if they wish to become pregnant or think they might be pregnant. There is a potential for serious side effects to an unborn child. Talk to your health care professional or pharmacist for more information. You should make sure that you get enough calcium and vitamin D while you are taking this medicine. Discuss the foods you eat and the vitamins you take with your health care professional. Some people who take this medicine have severe bone, joint, and/or muscle pain. This medicine may also increase your risk for a broken thigh bone. Tell your doctor right away if you have pain in your upper leg or groin. Tell your doctor if you have any pain that does not go away or that gets worse. What side effects may I notice from receiving this medicine? Side effects that you should report to your doctor or health care professional as soon as possible: -allergic reactions like skin rash, itching or hives, swelling of the face, lips, or tongue -black or tarry stools -changes in vision -eye inflammation, pain -high blood pressure -jaw pain, especially burning or cramping -muscle weakness -numb, tingling pain -swelling of feet or hands -trouble passing urine or change in the amount of urine -unable to move easily Side effects that usually do not require medical attention (report to your doctor or health care professional if they continue or are bothersome): -bone, joint, or muscle pain -constipation -dizzy, drowsy -fever -headache -loss of  appetite -nausea, vomiting -pain at site where injected This list may not describe all possible side effects. Call your doctor for medical advice about side effects. You may report side effects to FDA at  1-800-FDA-1088. Where should I keep my medicine? This drug is given in a hospital or clinic and will not be stored at home. NOTE: This sheet is a summary. It may not cover all possible information. If you have questions about this medicine, talk to your doctor, pharmacist, or health care provider.  2013, [employer]. (08/08/2010 8:49:49 AM)

## 2012-11-04 NOTE — Progress Notes (Signed)
DIAGNOSES: 1. IgG kappa myeloma. 2. Anemia secondary to renal insufficiency. 3. Intermittent iron-deficiency anemia. 4. Hypotestosteronemia.  CURRENT THERAPY: 1. Aredia 60 mg IV q.2 months. 2. Aranesp 300 mcg subcu as needed for hemoglobin less than 11. 3. IV iron as indicated. 4. Depo-Testosterone 200 mg IM q.2 months.  INTERIM HISTORY:  [name] comes in for followup.  He is doing very fairly well.  He does feel tired.  He typically has issues with this. He has diabetes.  He has fibromyalgia.  I think he last got iron back in May.  He typically responds pretty well to iron.  When we last saw him in July, his monoclonal spike was 0.66 g/dL.  His IgG level was 1920 mg/dL.  This is holding steady.  He watches his blood sugars.  He gives himself insulin.  He has chronic pain.  This is from his fibromyalgia.  He has had no fever.  He has had no leg swelling.  He has had some joint issues.  He has had no change in bowel or bladder habits.  PHYSICAL EXAMINATION:  General:  This is a fairly well-developed, well- nourished African American gentleman in no obvious distress.  Vital signs:  Temperature of 98, pulse 87, respiratory rate 16, blood pressure 156/67.  Weight is 192.  Head and neck:  Normocephalic, atraumatic skull.  There are no ocular or oral lesions.  There are no palpable cervical or supraclavicular lymph nodes.  Lungs:  Clear bilaterally. Cardiac:  Regular rate and rhythm with a normal S1 and S2.  There are no murmurs, rubs or bruits.  Abdomen:  Soft.  He has good bowel sounds. There is no fluid wave.  There is no palpable hepatosplenomegaly. Extremities:  Show no clubbing, cyanosis or edema.  He has some joint swelling, which is chronic.  He has decent range of motion of his joints.  He has good muscle strength.  Skin:  No rashes, ecchymosis, or petechia.  Neurological:  Shows no focal neurological deficits.  LABORATORY STUDIES:  White cell count is 8.9,  hemoglobin 12.6, hematocrit 38.5, platelet count 212,000.  MCV is 77.  Ferritin 2047 with an iron saturation of 73%.  BUN is 27, creatinine 1.9.  I did look at his blood smear.  I did not see anything abnormal.  He had some microcytic cells.  There were a few target cells.  He had no blasts.  There were no plasma cells.  Platelets were adequate in number and size.  IMPRESSION:  [name] is a 62 year old gentleman with multiple medical issues.  He has multiple hematological issues.  From my point of view, I think he is stable.  His myeloma certainly is not an issue.  He has not had any therapy now for about 2 years or more.  We will go ahead and give him the Aredia today.  I believe this is helping him out with respect to the myeloma.  He will get his testosterone.  He does not need iron.  He does not need any Aranesp.  We will go ahead and plan to get him back to see [place] in another 2 months.    ______________________________ Volanda Napoleon, M.D. PRE/MEDQ  D:  11/03/2012  T:  11/04/2012  Job:  [id number]

## 2012-11-07 ENCOUNTER — Telehealth: Payer: Self-pay | Admitting: Hematology & Oncology

## 2012-11-07 LAB — COMPREHENSIVE METABOLIC PANEL
Alkaline Phosphatase: 79 U/L (ref 39–117)
BUN: 27 mg/dL — ABNORMAL HIGH (ref 6–23)
Glucose, Bld: 112 mg/dL — ABNORMAL HIGH (ref 70–99)
Total Bilirubin: 0.3 mg/dL (ref 0.3–1.2)

## 2012-11-07 LAB — PROTEIN ELECTROPHORESIS, SERUM, WITH REFLEX
Alpha-2-Globulin: 12.3 % — ABNORMAL HIGH (ref 7.1–11.8)
Beta 2: 7.2 % — ABNORMAL HIGH (ref 3.2–6.5)
Beta Globulin: 5.4 % (ref 4.7–7.2)
Gamma Globulin: 26 % — ABNORMAL HIGH (ref 11.1–18.8)
M-Spike, %: 0.74 g/dL

## 2012-11-07 LAB — IFE INTERPRETATION

## 2012-11-07 LAB — KAPPA/LAMBDA LIGHT CHAINS: Kappa:Lambda Ratio: 1.21 (ref 0.26–1.65)

## 2012-11-07 LAB — IGG, IGA, IGM
IgA: 448 mg/dL — ABNORMAL HIGH (ref 68–379)
IgG (Immunoglobin G), Serum: 1870 mg/dL — ABNORMAL HIGH (ref 650–1600)
IgM, Serum: 88 mg/dL (ref 41–251)

## 2012-11-07 NOTE — Telephone Encounter (Signed)
Left message for pt to call.

## 2012-11-17 ENCOUNTER — Ambulatory Visit (HOSPITAL_BASED_OUTPATIENT_CLINIC_OR_DEPARTMENT_OTHER): Payer: Medicaid Other

## 2012-11-17 VITALS — BP 159/88 | HR 94 | Temp 97.0°F | Resp 16

## 2012-11-17 DIAGNOSIS — Z23 Encounter for immunization: Secondary | ICD-10-CM

## 2012-11-17 DIAGNOSIS — E349 Endocrine disorder, unspecified: Secondary | ICD-10-CM

## 2012-11-17 DIAGNOSIS — C9000 Multiple myeloma not having achieved remission: Secondary | ICD-10-CM

## 2012-11-17 DIAGNOSIS — E291 Testicular hypofunction: Secondary | ICD-10-CM

## 2012-11-17 MED ORDER — TESTOSTERONE CYPIONATE 200 MG/ML IM SOLN
200.0000 mg | INTRAMUSCULAR | Status: DC
  Administered 2012-11-17: 200 mg via INTRAMUSCULAR

## 2012-11-17 MED ORDER — TESTOSTERONE CYPIONATE 200 MG/ML IM SOLN
INTRAMUSCULAR | Status: AC
  Filled 2012-11-17: qty 2

## 2012-11-17 MED ORDER — INFLUENZA VAC SPLIT QUAD 0.5 ML IM SUSP
0.5000 mL | Freq: Once | INTRAMUSCULAR | Status: AC
  Administered 2012-11-17: 0.5 mL via INTRAMUSCULAR
  Filled 2012-11-17: qty 0.5

## 2012-11-17 NOTE — Patient Instructions (Signed)
Testosterone injection What is this medicine? TESTOSTERONE (tes TOS ter one) is the main male hormone. It supports normal male development such as muscle growth, facial hair, and deep voice. It is used in males to treat low testosterone levels. This medicine may be used for other purposes; ask your health care provider or pharmacist if you have questions. What should I tell my health care provider before I take this medicine? They need to know if you have any of these conditions: -breast cancer -diabetes -heart disease -kidney disease -liver disease -lung disease -prostate cancer, enlargement -an unusual or allergic reaction to testosterone, other medicines, foods, dyes, or preservatives -pregnant or trying to get pregnant -breast-feeding How should I use this medicine? This medicine is for injection into a muscle. It is usually given by a health care professional in a hospital or clinic setting. Contact your pediatrician regarding the use of this medicine in children. While this medicine may be prescribed for children as young as 49 years of age for selected conditions, precautions do apply. Overdosage: If you think you have taken too much of this medicine contact a poison control center or emergency room at once. NOTE: This medicine is only for you. Do not share this medicine with others. What if I miss a dose? Try not to miss a dose. Your doctor or health care professional will tell you when your next injection is due. Notify the office if you are unable to keep an appointment. What may interact with this medicine? -medicines for diabetes -medicines that treat or prevent blood clots like warfarin -oxyphenbutazone -propranolol -steroid medicines like prednisone or cortisone This list may not describe all possible interactions. Give your health care provider a list of all the medicines, herbs, non-prescription drugs, or dietary supplements you use. Also tell them if you smoke, drink  alcohol, or use illegal drugs. Some items may interact with your medicine. What should I watch for while using this medicine? Visit your doctor or health care professional for regular checks on your progress. They will need to check the level of testosterone in your blood. This medicine may affect blood sugar levels. If you have diabetes, check with your doctor or health care professional before you change your diet or the dose of your diabetic medicine. This drug is banned from use in athletes by most athletic organizations. What side effects may I notice from receiving this medicine? Side effects that you should report to your doctor or health care professional as soon as possible: -allergic reactions like skin rash, itching or hives, swelling of the face, lips, or tongue -breast enlargement -breathing problems -changes in mood, especially anger, depression, or rage -dark urine -general ill feeling or flu-like symptoms -light-colored stools -loss of appetite, nausea -nausea, vomiting -right upper belly pain -stomach pain -swelling of ankles -too frequent or persistent erections -trouble passing urine or change in the amount of urine -unusually weak or tired -yellowing of the eyes or skin Additional side effects that can occur in women include: -deep or hoarse voice -facial hair growth -irregular menstrual periods Side effects that usually do not require medical attention (report to your doctor or health care professional if they continue or are bothersome): -acne -change in sex drive or performance -hair loss -headache This list may not describe all possible side effects. Call your doctor for medical advice about side effects. You may report side effects to FDA at 1-800-FDA-1088. Where should I keep my medicine? Keep out of the reach of children. This medicine  can be abused. Keep your medicine in a safe place to protect it from theft. Do not share this medicine with anyone.  Selling or giving away this medicine is dangerous and against the law. Store at room temperature between 20 and 25 degrees C (68 and 77 degrees F). Do not freeze. Protect from light. Follow the directions for the product you are prescribed. Throw away any unused medicine after the expiration date. NOTE: This sheet is a summary. It may not cover all possible information. If you have questions about this medicine, talk to your doctor, pharmacist, or health care provider.  2012, [employer] Standard. (04/22/2007 4:13:46 PM)

## 2012-12-01 ENCOUNTER — Ambulatory Visit (HOSPITAL_BASED_OUTPATIENT_CLINIC_OR_DEPARTMENT_OTHER): Payer: Medicaid Other

## 2012-12-01 VITALS — BP 158/89 | HR 84 | Temp 96.8°F | Resp 16

## 2012-12-01 DIAGNOSIS — E349 Endocrine disorder, unspecified: Secondary | ICD-10-CM

## 2012-12-01 DIAGNOSIS — E291 Testicular hypofunction: Secondary | ICD-10-CM

## 2012-12-01 MED ORDER — TESTOSTERONE CYPIONATE 200 MG/ML IM SOLN
INTRAMUSCULAR | Status: AC
  Filled 2012-12-01: qty 1

## 2012-12-01 MED ORDER — TESTOSTERONE CYPIONATE 200 MG/ML IM SOLN
200.0000 mg | INTRAMUSCULAR | Status: DC
  Administered 2012-12-01: 200 mg via INTRAMUSCULAR

## 2012-12-15 ENCOUNTER — Ambulatory Visit (HOSPITAL_BASED_OUTPATIENT_CLINIC_OR_DEPARTMENT_OTHER): Payer: Medicaid Other

## 2012-12-15 VITALS — BP 169/85 | HR 113 | Temp 97.9°F | Resp 16

## 2012-12-15 DIAGNOSIS — E291 Testicular hypofunction: Secondary | ICD-10-CM

## 2012-12-15 DIAGNOSIS — C9000 Multiple myeloma not having achieved remission: Secondary | ICD-10-CM

## 2012-12-15 DIAGNOSIS — E349 Endocrine disorder, unspecified: Secondary | ICD-10-CM

## 2012-12-15 DIAGNOSIS — D509 Iron deficiency anemia, unspecified: Secondary | ICD-10-CM

## 2012-12-15 MED ORDER — TESTOSTERONE CYPIONATE 200 MG/ML IM SOLN
INTRAMUSCULAR | Status: AC
  Filled 2012-12-15: qty 1

## 2012-12-15 MED ORDER — DARBEPOETIN ALFA-POLYSORBATE 300 MCG/0.6ML IJ SOLN: 300.0000 ug | Freq: Once | INTRAMUSCULAR | Status: DC

## 2012-12-15 MED ORDER — TESTOSTERONE CYPIONATE 200 MG/ML IM SOLN
200.0000 mg | INTRAMUSCULAR | Status: DC
Start: 2012-12-15 — End: 2012-12-15
  Administered 2012-12-15: 200 mg via INTRAMUSCULAR

## 2012-12-15 MED ORDER — SODIUM CHLORIDE 0.9 % IV SOLN: 60.0000 mg | Freq: Once | INTRAVENOUS | Status: DC

## 2012-12-29 ENCOUNTER — Ambulatory Visit (HOSPITAL_BASED_OUTPATIENT_CLINIC_OR_DEPARTMENT_OTHER): Payer: Medicaid Other | Admitting: Lab

## 2012-12-29 ENCOUNTER — Ambulatory Visit (HOSPITAL_BASED_OUTPATIENT_CLINIC_OR_DEPARTMENT_OTHER): Payer: Medicaid Other | Admitting: Hematology & Oncology

## 2012-12-29 ENCOUNTER — Ambulatory Visit (HOSPITAL_BASED_OUTPATIENT_CLINIC_OR_DEPARTMENT_OTHER)
Admission: RE | Admit: 2012-12-29 | Discharge: 2012-12-29 | Disposition: A | Discharge status: Home / Self Care | Payer: Medicaid Other | Source: Ambulatory Visit | Attending: Hematology & Oncology | Admitting: Hematology & Oncology

## 2012-12-29 ENCOUNTER — Ambulatory Visit (HOSPITAL_BASED_OUTPATIENT_CLINIC_OR_DEPARTMENT_OTHER): Payer: Medicaid Other

## 2012-12-29 VITALS — BP 99/63 | HR 83 | Temp 98.7°F

## 2012-12-29 VITALS — BP 130/66 | HR 97 | Temp 98.6°F | Resp 18 | Ht 69.0 in | Wt 193.0 lb

## 2012-12-29 DIAGNOSIS — C9000 Multiple myeloma not having achieved remission: Secondary | ICD-10-CM

## 2012-12-29 DIAGNOSIS — N289 Disorder of kidney and ureter, unspecified: Secondary | ICD-10-CM

## 2012-12-29 DIAGNOSIS — M47817 Spondylosis without myelopathy or radiculopathy, lumbosacral region: Secondary | ICD-10-CM | POA: Insufficient documentation

## 2012-12-29 DIAGNOSIS — D509 Iron deficiency anemia, unspecified: Secondary | ICD-10-CM

## 2012-12-29 DIAGNOSIS — D631 Anemia in chronic kidney disease: Secondary | ICD-10-CM

## 2012-12-29 DIAGNOSIS — D649 Anemia, unspecified: Secondary | ICD-10-CM

## 2012-12-29 DIAGNOSIS — E349 Endocrine disorder, unspecified: Secondary | ICD-10-CM

## 2012-12-29 DIAGNOSIS — E291 Testicular hypofunction: Secondary | ICD-10-CM

## 2012-12-29 DIAGNOSIS — M545 Low back pain, unspecified: Secondary | ICD-10-CM | POA: Insufficient documentation

## 2012-12-29 DIAGNOSIS — M549 Dorsalgia, unspecified: Secondary | ICD-10-CM

## 2012-12-29 LAB — CBC WITH DIFFERENTIAL (CANCER CENTER ONLY)
BASO%: 0.7 % (ref 0.0–2.0)
EOS%: 2.2 % (ref 0.0–7.0)
HGB: 12 g/dL — ABNORMAL LOW (ref 13.0–17.1)
LYMPH#: 1.7 10*3/uL (ref 0.9–3.3)
LYMPH%: 16 % (ref 14.0–48.0)
MCH: 25.3 pg — ABNORMAL LOW (ref 28.0–33.4)
MCHC: 32.6 g/dL (ref 32.0–35.9)
MONO#: 1 10*3/uL — ABNORMAL HIGH (ref 0.1–0.9)
MONO%: 9.1 % (ref 0.0–13.0)
NEUT#: 7.6 10*3/uL — ABNORMAL HIGH (ref 1.5–6.5)
NEUT%: 72 % (ref 40.0–80.0)
Platelets: 240 10*3/uL (ref 145–400)
RDW: 15.8 % — ABNORMAL HIGH (ref 11.1–15.7)

## 2012-12-29 LAB — CMP (CANCER CENTER ONLY)
ALT(SGPT): 14 U/L (ref 10–47)
AST: 16 U/L (ref 11–38)
Albumin: 3.7 g/dL (ref 3.3–5.5)
Alkaline Phosphatase: 75 U/L (ref 26–84)
BUN, Bld: 27 mg/dL — ABNORMAL HIGH (ref 7–22)
Chloride: 99 mEq/L (ref 98–108)
Potassium: 4 mEq/L (ref 3.3–4.7)
Sodium: 139 mEq/L (ref 128–145)
Total Bilirubin: 0.5 mg/dl (ref 0.20–1.60)
Total Protein: 8.5 g/dL — ABNORMAL HIGH (ref 6.4–8.1)

## 2012-12-29 LAB — FERRITIN CHCC: Ferritin: 1484 ng/ml — ABNORMAL HIGH (ref 22–316)

## 2012-12-29 LAB — IRON AND TIBC CHCC
%SAT: 23 % (ref 20–55)
Iron: 50 ug/dL (ref 42–163)
UIBC: 172 ug/dL (ref 117–376)

## 2012-12-29 MED ORDER — SODIUM CHLORIDE 0.9 % IV SOLN
60.0000 mg | Freq: Once | INTRAVENOUS | Status: DC
  Filled 2012-12-29: qty 20

## 2012-12-29 MED ORDER — TESTOSTERONE CYPIONATE 200 MG/ML IM SOLN
200.0000 mg | INTRAMUSCULAR | Status: DC
  Administered 2012-12-29: 200 mg via INTRAMUSCULAR

## 2012-12-29 MED ORDER — SODIUM CHLORIDE 0.9 % IV SOLN
60.0000 mg | Freq: Once | INTRAVENOUS | Status: AC
  Administered 2012-12-29: 60 mg via INTRAVENOUS
  Filled 2012-12-29: qty 6.67

## 2012-12-29 MED ORDER — SODIUM CHLORIDE 0.9 % IV SOLN
Freq: Once | INTRAVENOUS | Status: AC
  Administered 2012-12-29: 10:00:00 via INTRAVENOUS

## 2012-12-29 NOTE — Progress Notes (Signed)
This office note has been dictated.

## 2012-12-29 NOTE — Patient Instructions (Signed)
Pamidronate injection What is this medicine? PAMIDRONATE (pa mi DROE nate) slows calcium loss from bones. It is used to treat high calcium blood levels from cancer or Paget's disease. It is also used to treat bone pain and prevent fractures from certain cancers that have spread to the bone. This medicine may be used for other purposes; ask your health care provider or pharmacist if you have questions. COMMON BRAND NAME(S): Aredia What should I tell my health care provider before I take this medicine? They need to know if you have any of these conditions: -aspirin-sensitive asthma -dental disease -kidney disease -an unusual or allergic reaction to pamidronate, other medicines, foods, dyes, or preservatives -pregnant or trying to get pregnant -breast-feeding How should I use this medicine? This medicine is for infusion into a vein. It is given by a health care professional in a hospital or clinic setting. Talk to your pediatrician regarding the use of this medicine in children. This medicine is not approved for use in children. Overdosage: If you think you have taken too much of this medicine contact a poison control center or emergency room at once. NOTE: This medicine is only for you. Do not share this medicine with others. What if I miss a dose? This does not apply. What may interact with this medicine? -certain antibiotics given by injection -medicines for inflammation or pain like ibuprofen, naproxen -some diuretics like bumetanide, furosemide -cyclosporine -parathyroid hormone -tacrolimus -teriparatide -thalidomide This list may not describe all possible interactions. Give your health care provider a list of all the medicines, herbs, non-prescription drugs, or dietary supplements you use. Also tell them if you smoke, drink alcohol, or use illegal drugs. Some items may interact with your medicine. What should I watch for while using this medicine? Visit your doctor or health care  professional for regular checkups. It may be some time before you see the benefit from this medicine. Do not stop taking your medicine unless your doctor tells you to. Your doctor may order blood tests or other tests to see how you are doing. Women should inform their doctor if they wish to become pregnant or think they might be pregnant. There is a potential for serious side effects to an unborn child. Talk to your health care professional or pharmacist for more information. You should make sure that you get enough calcium and vitamin D while you are taking this medicine. Discuss the foods you eat and the vitamins you take with your health care professional. Some people who take this medicine have severe bone, joint, and/or muscle pain. This medicine may also increase your risk for a broken thigh bone. Tell your doctor right away if you have pain in your upper leg or groin. Tell your doctor if you have any pain that does not go away or that gets worse. What side effects may I notice from receiving this medicine? Side effects that you should report to your doctor or health care professional as soon as possible: -allergic reactions like skin rash, itching or hives, swelling of the face, lips, or tongue -black or tarry stools -changes in vision -eye inflammation, pain -high blood pressure -jaw pain, especially burning or cramping -muscle weakness -numb, tingling pain -swelling of feet or hands -trouble passing urine or change in the amount of urine -unable to move easily Side effects that usually do not require medical attention (report to your doctor or health care professional if they continue or are bothersome): -bone, joint, or muscle pain -constipation -dizzy, drowsy -  fever -headache -loss of appetite -nausea, vomiting -pain at site where injected This list may not describe all possible side effects. Call your doctor for medical advice about side effects. You may report side effects to  FDA at 1-800-FDA-1088. Where should I keep my medicine? This drug is given in a hospital or clinic and will not be stored at home. NOTE: This sheet is a summary. It may not cover all possible information. If you have questions about this medicine, talk to your doctor, pharmacist, or health care provider.  2014, [employer]. (2010-08-08 08:49:49)

## 2012-12-30 NOTE — Progress Notes (Signed)
DIAGNOSES: 1. IgG kappa myeloma. 2. 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 3. Intermittent iron-deficiency anemia. 4. Hypotestosteronemia.  CURRENT THERAPY: 1. Aredia 60 mg IV q.2 months. 2. Aranesp 300 mcg subcu as needed for hemoglobin less than 11. 3. Depo-Testosterone 200 mg IM every 2 months and IV iron as     indicated.  INTERIM HISTORY:  [name] comes in for his followup.  He is complaining of some low back issues now.  This has been going on for the past couple of weeks.  His last spinal film was back in January.  Everything looked fine.  We would like to go ahead and repeat the film.    ______________________________ Volanda Napoleon, M.D. PRE/MEDQ  D:  12/27/2012  T:  12/30/2012  Job:  [id number]

## 2012-12-31 NOTE — Progress Notes (Signed)
DIAGNOSES: 1. IgG kappa myeloma. 2. Anemia secondary to renal insufficiency. 3. Intermittent iron-deficiency anemia. 4. Hypotestosteronemia.  CURRENT THERAPY: 1. Aredia 60 mg IV every 2 months. 2. Aranesp 300 mcg subcu as needed for hemoglobin less than 11. 3. Depo-Testosterone 200 mg IM every 2 months. 4. IV iron as indicated.  INTERIM HISTORY:  [name] comes in for followup.  His problem is he is complaining of some back pain now.  This has been going on for a few weeks.  We will go ahead and get some plain films on his lower spine.  His last one was done back in January and they looked okay.  His myeloma studies have been doing relatively well.  He had these done back in September.  His monoclonal spike was 0.74 g/dL.  IgG level was 1870 mg/dL.  Kappa light chain was 4.04 mg/dL.  He has non-insulin-dependent diabetes.  He is on Glucotrol for his blood sugars.  He has had no fever.  He has had no headache.  He has had no double vision or blurred vision.  PHYSICAL EXAMINATION:  General:  This is a fairly well-developed, well- nourished African American gentleman, in no obvious distress.  Vital Signs:  Temperature of 98.6, pulse 97, respiratory rate 18, blood pressure 130/66, weight is 193 pounds.  Head and Neck:  A normocephalic, atraumatic skull.  There are no ocular or oral lesions.  There are no palpable cervical or supraclavicular lymph nodes.  Lungs:  Clear bilaterally.  Cardiac:  Regular rate and rhythm with normal S1 and S2. There are no murmurs, rubs, or bruits.  Abdomen:  Soft.  He has good bowel sounds.  There is no fluid wave.  There is no palpable hepatosplenomegaly.  Extremities:  No clubbing, cyanosis, or edema.  He has good strength in his legs.  Neurological:  No focal neurological deficit.  LABORATORY STUDIES:  White cell count is 10.6, hemoglobin 12, hematocrit 36.8, platelet count 240,000.  MCV is 78.  BUN 27, creatinine 2.2. Total protein 8.5 with  an albumin of 3.7.  IMPRESSION:  [name] is a 62 year old gentleman with history of IgG kappa myeloma.  We have not had to treat the myeloma for a couple years at least.  I will be interested to see what his monoclonal studies look like.  We will see what his back films look like.  We may have to consider a PET scan or possibly MRI to detect about myelomas bone involvement.  We will go ahead with his Aredia today.  He gets his testosterone when we see him.  I want to see him back in 2 months' time.  We can certainly get him back sooner if problems arise.    ______________________________ Volanda Napoleon, M.D. PRE/MEDQ  D:  12/27/2012  T:  12/30/2012  Job:  [id number]

## 2013-01-02 ENCOUNTER — Telehealth: Payer: Self-pay | Admitting: *Deleted

## 2013-01-02 LAB — TRANSFERRIN RECEPTOR, SOLUABLE: Transferrin Receptor, Soluble: 1.32 mg/L (ref 0.76–1.76)

## 2013-01-02 LAB — KAPPA/LAMBDA LIGHT CHAINS
Kappa free light chain: 5.6 mg/dL — ABNORMAL HIGH (ref 0.33–1.94)
Kappa:Lambda Ratio: 1.34 (ref 0.26–1.65)
Lambda Free Lght Chn: 4.18 mg/dL — ABNORMAL HIGH (ref 0.57–2.63)

## 2013-01-02 LAB — PROTEIN ELECTROPHORESIS, SERUM, WITH REFLEX
Alpha-1-Globulin: 4.7 % (ref 2.9–4.9)
Alpha-2-Globulin: 12.8 % — ABNORMAL HIGH (ref 7.1–11.8)
Beta Globulin: 5.2 % (ref 4.7–7.2)
Gamma Globulin: 22.1 % — ABNORMAL HIGH (ref 11.1–18.8)
M-Spike, %: 0.72 g/dL

## 2013-01-02 LAB — TESTOSTERONE: Testosterone: 580 ng/dL (ref 300–890)

## 2013-01-02 LAB — IGG, IGA, IGM: IgM, Serum: 92 mg/dL (ref 41–251)

## 2013-01-02 NOTE — Telephone Encounter (Addendum)
Message copied by Orlando Penner on Mon Jan 02, 2013 10:09 AM ------      Message from: Burney Gauze R      Created: Fri Dec 30, 2012  1:03 PM       Please call and tell him that there is no obvious myeloma. He has some arthritis. It is Back keeps bothering him that he probably needs to see his family Dr. Marina Gravel. Pete ------This message given to pt.  Voiced understanding.  States his back is worse and he will call his primary care physician.

## 2013-01-26 ENCOUNTER — Ambulatory Visit (HOSPITAL_BASED_OUTPATIENT_CLINIC_OR_DEPARTMENT_OTHER): Payer: Medicaid Other

## 2013-01-26 ENCOUNTER — Ambulatory Visit: Payer: Medicaid Other

## 2013-01-26 ENCOUNTER — Other Ambulatory Visit: Payer: Medicaid Other | Admitting: Lab

## 2013-01-26 VITALS — BP 151/75 | HR 84 | Temp 98.1°F | Resp 18

## 2013-01-26 DIAGNOSIS — E291 Testicular hypofunction: Secondary | ICD-10-CM

## 2013-01-26 DIAGNOSIS — E349 Endocrine disorder, unspecified: Secondary | ICD-10-CM

## 2013-01-26 MED ORDER — TESTOSTERONE CYPIONATE 200 MG/ML IM SOLN
200.0000 mg | INTRAMUSCULAR | Status: DC
  Administered 2013-01-26: 200 mg via INTRAMUSCULAR

## 2013-01-26 MED ORDER — TESTOSTERONE CYPIONATE 200 MG/ML IM SOLN
INTRAMUSCULAR | Status: AC
  Filled 2013-01-26: qty 1

## 2013-01-26 NOTE — Patient Instructions (Signed)
Testosterone injection What is this medicine? TESTOSTERONE (tes TOS ter one) is the main male hormone. It supports normal male development such as muscle growth, facial hair, and deep voice. It is used in males to treat low testosterone levels. This medicine may be used for other purposes; ask your health care provider or pharmacist if you have questions. COMMON BRAND NAME(S): Andro-L.A., Aveed, Delatestryl, Depo-Testosterone, Virilon What should I tell my health care provider before I take this medicine? They need to know if you have any of these conditions: -breast cancer -diabetes -heart disease -kidney disease -liver disease -lung disease -prostate cancer, enlargement -an unusual or allergic reaction to testosterone, other medicines, foods, dyes, or preservatives -pregnant or trying to get pregnant -breast-feeding How should I use this medicine? This medicine is for injection into a muscle. It is usually given by a health care professional in a hospital or clinic setting. Contact your pediatrician regarding the use of this medicine in children. While this medicine may be prescribed for children as young as 60 years of age for selected conditions, precautions do apply. Overdosage: If you think you have taken too much of this medicine contact a poison control center or emergency room at once. NOTE: This medicine is only for you. Do not share this medicine with others. What if I miss a dose? Try not to miss a dose. Your doctor or health care professional will tell you when your next injection is due. Notify the office if you are unable to keep an appointment. What may interact with this medicine? -medicines for diabetes -medicines that treat or prevent blood clots like warfarin -oxyphenbutazone -propranolol -steroid medicines like prednisone or cortisone This list may not describe all possible interactions. Give your health care provider a list of all the medicines, herbs,  non-prescription drugs, or dietary supplements you use. Also tell them if you smoke, drink alcohol, or use illegal drugs. Some items may interact with your medicine. What should I watch for while using this medicine? Visit your doctor or health care professional for regular checks on your progress. They will need to check the level of testosterone in your blood. This medicine may affect blood sugar levels. If you have diabetes, check with your doctor or health care professional before you change your diet or the dose of your diabetic medicine. This drug is banned from use in athletes by most athletic organizations. What side effects may I notice from receiving this medicine? Side effects that you should report to your doctor or health care professional as soon as possible: -allergic reactions like skin rash, itching or hives, swelling of the face, lips, or tongue -breast enlargement -breathing problems -changes in mood, especially anger, depression, or rage -dark urine -general ill feeling or flu-like symptoms -light-colored stools -loss of appetite, nausea -nausea, vomiting -right upper belly pain -stomach pain -swelling of ankles -too frequent or persistent erections -trouble passing urine or change in the amount of urine -unusually weak or tired -yellowing of the eyes or skin Additional side effects that can occur in women include: -deep or hoarse voice -facial hair growth -irregular menstrual periods Side effects that usually do not require medical attention (report to your doctor or health care professional if they continue or are bothersome): -acne -change in sex drive or performance -hair loss -headache This list may not describe all possible side effects. Call your doctor for medical advice about side effects. You may report side effects to FDA at 1-800-FDA-1088. Where should I keep my medicine? Keep  out of the reach of children. This medicine can be abused. Keep your  medicine in a safe place to protect it from theft. Do not share this medicine with anyone. Selling or giving away this medicine is dangerous and against the law. Store at room temperature between 20 and 25 degrees C (68 and 77 degrees F). Do not freeze. Protect from light. Follow the directions for the product you are prescribed. Throw away any unused medicine after the expiration date. NOTE: This sheet is a summary. It may not cover all possible information. If you have questions about this medicine, talk to your doctor, pharmacist, or health care provider.  2014, [employer] Standard. (2007-04-22 16:13:46)

## 2013-02-24 ENCOUNTER — Ambulatory Visit (HOSPITAL_BASED_OUTPATIENT_CLINIC_OR_DEPARTMENT_OTHER): Payer: Medicaid Other | Admitting: Lab

## 2013-02-24 ENCOUNTER — Ambulatory Visit (HOSPITAL_BASED_OUTPATIENT_CLINIC_OR_DEPARTMENT_OTHER): Payer: Medicaid Other | Admitting: Hematology & Oncology

## 2013-02-24 ENCOUNTER — Ambulatory Visit (HOSPITAL_BASED_OUTPATIENT_CLINIC_OR_DEPARTMENT_OTHER): Payer: Medicaid Other

## 2013-02-24 VITALS — BP 135/62 | HR 82 | Temp 98.0°F | Resp 18 | Ht 69.0 in | Wt 197.0 lb

## 2013-02-24 DIAGNOSIS — D509 Iron deficiency anemia, unspecified: Secondary | ICD-10-CM

## 2013-02-24 DIAGNOSIS — D649 Anemia, unspecified: Secondary | ICD-10-CM

## 2013-02-24 DIAGNOSIS — E349 Endocrine disorder, unspecified: Secondary | ICD-10-CM

## 2013-02-24 DIAGNOSIS — C9000 Multiple myeloma not having achieved remission: Secondary | ICD-10-CM

## 2013-02-24 DIAGNOSIS — N039 Chronic nephritic syndrome with unspecified morphologic changes: Secondary | ICD-10-CM

## 2013-02-24 DIAGNOSIS — N289 Disorder of kidney and ureter, unspecified: Secondary | ICD-10-CM

## 2013-02-24 DIAGNOSIS — D631 Anemia in chronic kidney disease: Secondary | ICD-10-CM

## 2013-02-24 DIAGNOSIS — E291 Testicular hypofunction: Secondary | ICD-10-CM

## 2013-02-24 DIAGNOSIS — N189 Chronic kidney disease, unspecified: Secondary | ICD-10-CM

## 2013-02-24 LAB — CBC WITH DIFFERENTIAL (CANCER CENTER ONLY)
BASO#: 0.1 10*3/uL (ref 0.0–0.2)
BASO%: 0.9 % (ref 0.0–2.0)
EOS ABS: 0.3 10*3/uL (ref 0.0–0.5)
EOS%: 3.8 % (ref 0.0–7.0)
HCT: 36.9 % — ABNORMAL LOW (ref 38.7–49.9)
HGB: 11.9 g/dL — ABNORMAL LOW (ref 13.0–17.1)
LYMPH#: 1.7 10*3/uL (ref 0.9–3.3)
LYMPH%: 25.8 % (ref 14.0–48.0)
MCH: 25.2 pg — ABNORMAL LOW (ref 28.0–33.4)
MCHC: 32.2 g/dL (ref 32.0–35.9)
MCV: 78 fL — ABNORMAL LOW (ref 82–98)
MONO#: 0.7 10*3/uL (ref 0.1–0.9)
MONO%: 10.3 % (ref 0.0–13.0)
NEUT%: 59.2 % (ref 40.0–80.0)
NEUTROS ABS: 3.9 10*3/uL (ref 1.5–6.5)
PLATELETS: 231 10*3/uL (ref 145–400)
RBC: 4.73 10*6/uL (ref 4.20–5.70)
RDW: 15.1 % (ref 11.1–15.7)
WBC: 6.5 10*3/uL (ref 4.0–10.0)

## 2013-02-24 LAB — FERRITIN CHCC

## 2013-02-24 LAB — IRON AND TIBC CHCC
%SAT: 45 % (ref 20–55)
IRON: 100 ug/dL (ref 42–163)
TIBC: 224 ug/dL (ref 202–409)
UIBC: 124 ug/dL (ref 117–376)

## 2013-02-24 MED ORDER — SODIUM CHLORIDE 0.9 % IV SOLN
60.0000 mg | Freq: Once | INTRAVENOUS | Status: DC
  Filled 2013-02-24: qty 20

## 2013-02-24 MED ORDER — TESTOSTERONE CYPIONATE 200 MG/ML IM SOLN
INTRAMUSCULAR | Status: AC
  Filled 2013-02-24: qty 1

## 2013-02-24 MED ORDER — DARBEPOETIN ALFA-POLYSORBATE 300 MCG/0.6ML IJ SOLN: 300.0000 ug | Freq: Once | INTRAMUSCULAR | Status: DC

## 2013-02-24 MED ORDER — SODIUM CHLORIDE 0.9 % IV SOLN
Freq: Once | INTRAVENOUS | Status: AC
  Administered 2013-02-24: 10:00:00 via INTRAVENOUS

## 2013-02-24 MED ORDER — SODIUM CHLORIDE 0.9 % IV SOLN
60.0000 mg | Freq: Once | INTRAVENOUS | Status: AC
  Administered 2013-02-24: 60 mg via INTRAVENOUS
  Filled 2013-02-24: qty 10

## 2013-02-24 MED ORDER — TESTOSTERONE CYPIONATE 200 MG/ML IM SOLN
200.0000 mg | INTRAMUSCULAR | Status: DC
  Administered 2013-02-24: 200 mg via INTRAMUSCULAR

## 2013-02-24 MED ORDER — SODIUM CHLORIDE 0.9 % IJ SOLN
10.0000 mL | INTRAMUSCULAR | Status: DC | PRN
  Administered 2013-02-24: 10 mL via INTRAVENOUS
  Filled 2013-02-24: qty 10

## 2013-02-24 MED ORDER — HEPARIN SOD (PORK) LOCK FLUSH 100 UNIT/ML IV SOLN
500.0000 [IU] | Freq: Once | INTRAVENOUS | Status: AC
  Administered 2013-02-24: 500 [IU] via INTRAVENOUS
  Filled 2013-02-24: qty 5

## 2013-02-24 NOTE — Patient Instructions (Signed)
Testosterone injection What is this medicine? TESTOSTERONE (tes TOS ter one) is the main male hormone. It supports normal male development such as muscle growth, facial hair, and deep voice. It is used in males to treat low testosterone levels. This medicine may be used for other purposes; ask your health care provider or pharmacist if you have questions. COMMON BRAND NAME(S): Andro-L.A., Aveed, Delatestryl, Depo-Testosterone, Virilon What should I tell my health care provider before I take this medicine? They need to know if you have any of these conditions: -breast cancer -diabetes -heart disease -kidney disease -liver disease -lung disease -prostate cancer, enlargement -an unusual or allergic reaction to testosterone, other medicines, foods, dyes, or preservatives -pregnant or trying to get pregnant -breast-feeding How should I use this medicine? This medicine is for injection into a muscle. It is usually given by a health care professional in a hospital or clinic setting. Contact your pediatrician regarding the use of this medicine in children. While this medicine may be prescribed for children as young as 30 years of age for selected conditions, precautions do apply. Overdosage: If you think you have taken too much of this medicine contact a poison control center or emergency room at once. NOTE: This medicine is only for you. Do not share this medicine with others. What if I miss a dose? Try not to miss a dose. Your doctor or health care professional will tell you when your next injection is due. Notify the office if you are unable to keep an appointment. What may interact with this medicine? -medicines for diabetes -medicines that treat or prevent blood clots like warfarin -oxyphenbutazone -propranolol -steroid medicines like prednisone or cortisone This list may not describe all possible interactions. Give your health care provider a list of all the medicines, herbs,  non-prescription drugs, or dietary supplements you use. Also tell them if you smoke, drink alcohol, or use illegal drugs. Some items may interact with your medicine. What should I watch for while using this medicine? Visit your doctor or health care professional for regular checks on your progress. They will need to check the level of testosterone in your blood. This medicine may affect blood sugar levels. If you have diabetes, check with your doctor or health care professional before you change your diet or the dose of your diabetic medicine. This drug is banned from use in athletes by most athletic organizations. What side effects may I notice from receiving this medicine? Side effects that you should report to your doctor or health care professional as soon as possible: -allergic reactions like skin rash, itching or hives, swelling of the face, lips, or tongue -breast enlargement -breathing problems -changes in mood, especially anger, depression, or rage -dark urine -general ill feeling or flu-like symptoms -light-colored stools -loss of appetite, nausea -nausea, vomiting -right upper belly pain -stomach pain -swelling of ankles -too frequent or persistent erections -trouble passing urine or change in the amount of urine -unusually weak or tired -yellowing of the eyes or skin Additional side effects that can occur in women include: -deep or hoarse voice -facial hair growth -irregular menstrual periods Side effects that usually do not require medical attention (report to your doctor or health care professional if they continue or are bothersome): -acne -change in sex drive or performance -hair loss -headache This list may not describe all possible side effects. Call your doctor for medical advice about side effects. You may report side effects to FDA at 1-800-FDA-1088. Where should I keep my medicine? Keep  out of the reach of children. This medicine can be abused. Keep your  medicine in a safe place to protect it from theft. Do not share this medicine with anyone. Selling or giving away this medicine is dangerous and against the law. Store at room temperature between 20 and 25 degrees C (68 and 77 degrees F). Do not freeze. Protect from light. Follow the directions for the product you are prescribed. Throw away any unused medicine after the expiration date. NOTE: This sheet is a summary. It may not cover all possible information. If you have questions about this medicine, talk to your doctor, pharmacist, or health care provider.  2014, [employer] Standard. (2007-04-22 16:13:46) Pamidronate injection What is this medicine? PAMIDRONATE (pa mi DROE nate) slows calcium loss from bones. It is used to treat high calcium blood levels from cancer or Paget's disease. It is also used to treat bone pain and prevent fractures from certain cancers that have spread to the bone. This medicine may be used for other purposes; ask your health care provider or pharmacist if you have questions. COMMON BRAND NAME(S): Aredia What should I tell my health care provider before I take this medicine? They need to know if you have any of these conditions: -aspirin-sensitive asthma -dental disease -kidney disease -an unusual or allergic reaction to pamidronate, other medicines, foods, dyes, or preservatives -pregnant or trying to get pregnant -breast-feeding How should I use this medicine? This medicine is for infusion into a vein. It is given by a health care professional in a hospital or clinic setting. Talk to your pediatrician regarding the use of this medicine in children. This medicine is not approved for use in children. Overdosage: If you think you have taken too much of this medicine contact a poison control center or emergency room at once. NOTE: This medicine is only for you. Do not share this medicine with others. What if I miss a dose? This does not apply. What may interact  with this medicine? -certain antibiotics given by injection -medicines for inflammation or pain like ibuprofen, naproxen -some diuretics like bumetanide, furosemide -cyclosporine -parathyroid hormone -tacrolimus -teriparatide -thalidomide This list may not describe all possible interactions. Give your health care provider a list of all the medicines, herbs, non-prescription drugs, or dietary supplements you use. Also tell them if you smoke, drink alcohol, or use illegal drugs. Some items may interact with your medicine. What should I watch for while using this medicine? Visit your doctor or health care professional for regular checkups. It may be some time before you see the benefit from this medicine. Do not stop taking your medicine unless your doctor tells you to. Your doctor may order blood tests or other tests to see how you are doing. Women should inform their doctor if they wish to become pregnant or think they might be pregnant. There is a potential for serious side effects to an unborn child. Talk to your health care professional or pharmacist for more information. You should make sure that you get enough calcium and vitamin D while you are taking this medicine. Discuss the foods you eat and the vitamins you take with your health care professional. Some people who take this medicine have severe bone, joint, and/or muscle pain. This medicine may also increase your risk for a broken thigh bone. Tell your doctor right away if you have pain in your upper leg or groin. Tell your doctor if you have any pain that does not go away or that gets  worse. What side effects may I notice from receiving this medicine? Side effects that you should report to your doctor or health care professional as soon as possible: -allergic reactions like skin rash, itching or hives, swelling of the face, lips, or tongue -black or tarry stools -changes in vision -eye inflammation, pain -high blood pressure -jaw  pain, especially burning or cramping -muscle weakness -numb, tingling pain -swelling of feet or hands -trouble passing urine or change in the amount of urine -unable to move easily Side effects that usually do not require medical attention (report to your doctor or health care professional if they continue or are bothersome): -bone, joint, or muscle pain -constipation -dizzy, drowsy -fever -headache -loss of appetite -nausea, vomiting -pain at site where injected This list may not describe all possible side effects. Call your doctor for medical advice about side effects. You may report side effects to FDA at 1-800-FDA-1088. Where should I keep my medicine? This drug is given in a hospital or clinic and will not be stored at home. NOTE: This sheet is a summary. It may not cover all possible information. If you have questions about this medicine, talk to your doctor, pharmacist, or health care provider.  2014, [employer] Standard. (2010-08-08 08:49:49)

## 2013-03-01 LAB — COMPREHENSIVE METABOLIC PANEL
ALBUMIN: 4.1 g/dL (ref 3.5–5.2)
ALT: 24 U/L (ref 0–53)
AST: 23 U/L (ref 0–37)
Alkaline Phosphatase: 91 U/L (ref 39–117)
BUN: 42 mg/dL — AB (ref 6–23)
CALCIUM: 9.1 mg/dL (ref 8.4–10.5)
CHLORIDE: 98 meq/L (ref 96–112)
CO2: 27 meq/L (ref 19–32)
Creatinine, Ser: 2.26 mg/dL — ABNORMAL HIGH (ref 0.50–1.35)
GLUCOSE: 142 mg/dL — AB (ref 70–99)
POTASSIUM: 4.1 meq/L (ref 3.5–5.3)
SODIUM: 138 meq/L (ref 135–145)
TOTAL PROTEIN: 8.3 g/dL (ref 6.0–8.3)
Total Bilirubin: 0.2 mg/dL — ABNORMAL LOW (ref 0.3–1.2)

## 2013-03-01 LAB — PROTEIN ELECTROPHORESIS, SERUM, WITH REFLEX
ALPHA-2-GLOBULIN: 12.1 % — AB (ref 7.1–11.8)
Albumin ELP: 48.7 % — ABNORMAL LOW (ref 55.8–66.1)
Alpha-1-Globulin: 4.6 % (ref 2.9–4.9)
BETA GLOBULIN: 5.2 % (ref 4.7–7.2)
Beta 2: 6.2 % (ref 3.2–6.5)
GAMMA GLOBULIN: 23.2 % — AB (ref 11.1–18.8)
M-SPIKE, %: 0.78 g/dL
Total Protein, Serum Electrophoresis: 8.3 g/dL (ref 6.0–8.3)

## 2013-03-01 LAB — IGG, IGA, IGM
IGA: 497 mg/dL — AB (ref 68–379)
IGG (IMMUNOGLOBIN G), SERUM: 2090 mg/dL — AB (ref 650–1600)
IGM, SERUM: 97 mg/dL (ref 41–251)

## 2013-03-01 LAB — IFE INTERPRETATION

## 2013-03-01 LAB — KAPPA/LAMBDA LIGHT CHAINS
Kappa free light chain: 5.52 mg/dL — ABNORMAL HIGH (ref 0.33–1.94)
Kappa:Lambda Ratio: 1.27 (ref 0.26–1.65)
Lambda Free Lght Chn: 4.36 mg/dL — ABNORMAL HIGH (ref 0.57–2.63)

## 2013-03-01 LAB — TESTOSTERONE: Testosterone: 186 ng/dL — ABNORMAL LOW (ref 300–890)

## 2013-03-01 NOTE — Progress Notes (Signed)
This office note has been dictated.

## 2013-03-02 NOTE — Progress Notes (Signed)
DIAGNOSES: 1. IgG kappa myeloma. 2. Anemia secondary to renal insufficiency. 3. Intermittent iron-deficiency anemia. 4. Hypotestosteronemia.  CURRENT THERAPY: 1. Aredia 60 mg IV q.2 months. 2. Aranesp 300 mcg subcu as needed for hemoglobin less than 11. 3. Depo-Testosterone 200 mg q.2 weeks. 4. IV iron as indicated.  INTERIM HISTORY:  [name] comes in for his followup.  He got through the holidays.  He is always having issues with pain.  At point, this may be neuropathy.  __________ myalgia.  Although, I do not think any of this would be from the myeloma and bony issues.  We last saw him back in November.  At that point time, his monoclonal spike was 0.72 g/dL.  IgG level was 2110 mg/dL and kappa light chain was 5.6 mg/dL.  His ferritin was elevated at 1480.  However, his iron saturation was only 23%.  His iron was 53 for [name], this is low.  He always feels tired.  He is on quite a few medications.  I am not sure how well controlled is blood sugars are.  He has had no change in bowel or bladder habits.  He has had no leg swelling.  He has had no rashes.  Overall, his performance status is ECOG 1-2.  PHYSICAL EXAMINATION:  General:  This is a fairly well-developed, well- nourished white gentleman, in no obvious distress.  Vital Signs: Temperature of 98.6, pulse 82, respiratory rate 18, blood pressure 135/62, weight is 197 pounds.  Head and Neck:  Normocephalic, atraumatic skull.  There are no ocular or oral lesions.  There are no palpable cervical or supraclavicular lymph nodes.  Lungs:  Clear bilaterally. Cardiac:  Regular rate and rhythm with a normal S1 and S2.  There are no murmurs, rubs, or bruits.  Abdomen:  Soft.  He has good bowel sounds. There is no fluid wave.  There is no palpable abdominal mass.  There is no palpable hepatosplenomegaly.  Extremities:  Show no clubbing, cyanosis, or edema.  Neurological:  Shows no focal  neurological deficits.  LABORATORY STUDIES:  White cell count is 6.5, hemoglobin 11.9, hematocrit 37, platelet count 231.  M spike is 0.78 g/dL.  IgG level is 2090 mg/dL.  Kappa light chain is 5.52 mg/dL.  Ferritin is 1350, iron saturation 45%.  Testosterone is 186.  BUN 42, creatinine 2.26.  IMPRESSION:  [name] is a 63 year old gentleman with IgG kappa myeloma.  He is doing well with this.  He has low level disk disease, but we do not treat this.  We will give him Aredia today.  I believe this is helping to some degree.  He gets his testosterone every 2 weeks.  He has chronic pain issues, which we typically do not involved with.  I do not think he needs any iron.  We will plan to get him back in 2 weeks to see me.  I think he can return back very every 2 weeks for his testosterone.  I am going back in a month to see about whether not the Aranesp is necessary.    ______________________________ Volanda Napoleon, M.D. PRE/MEDQ  D:  03/01/2013  T:  03/02/2013  Job:  [id number]

## 2013-03-10 ENCOUNTER — Ambulatory Visit (HOSPITAL_BASED_OUTPATIENT_CLINIC_OR_DEPARTMENT_OTHER): Payer: Medicaid Other

## 2013-03-10 VITALS — BP 157/93 | HR 103 | Temp 98.1°F | Resp 20

## 2013-03-10 DIAGNOSIS — E349 Endocrine disorder, unspecified: Secondary | ICD-10-CM

## 2013-03-10 DIAGNOSIS — E291 Testicular hypofunction: Secondary | ICD-10-CM

## 2013-03-10 MED ORDER — TESTOSTERONE CYPIONATE 200 MG/ML IM SOLN
200.0000 mg | INTRAMUSCULAR | Status: DC
  Administered 2013-03-10: 200 mg via INTRAMUSCULAR

## 2013-03-10 MED ORDER — TESTOSTERONE CYPIONATE 200 MG/ML IM SOLN
INTRAMUSCULAR | Status: AC
  Filled 2013-03-10: qty 1

## 2013-03-10 NOTE — Patient Instructions (Signed)
Testosterone injection What is this medicine? TESTOSTERONE (tes TOS ter one) is the main male hormone. It supports normal male development such as muscle growth, facial hair, and deep voice. It is used in males to treat low testosterone levels. This medicine may be used for other purposes; ask your health care provider or pharmacist if you have questions. COMMON BRAND NAME(S): Andro-L.A., Aveed, Delatestryl, Depo-Testosterone, Virilon What should I tell my health care provider before I take this medicine? They need to know if you have any of these conditions: -breast cancer -diabetes -heart disease -kidney disease -liver disease -lung disease -prostate cancer, enlargement -an unusual or allergic reaction to testosterone, other medicines, foods, dyes, or preservatives -pregnant or trying to get pregnant -breast-feeding How should I use this medicine? This medicine is for injection into a muscle. It is usually given by a health care professional in a hospital or clinic setting. Contact your pediatrician regarding the use of this medicine in children. While this medicine may be prescribed for children as young as 52 years of age for selected conditions, precautions do apply. Overdosage: If you think you have taken too much of this medicine contact a poison control center or emergency room at once. NOTE: This medicine is only for you. Do not share this medicine with others. What if I miss a dose? Try not to miss a dose. Your doctor or health care professional will tell you when your next injection is due. Notify the office if you are unable to keep an appointment. What may interact with this medicine? -medicines for diabetes -medicines that treat or prevent blood clots like warfarin -oxyphenbutazone -propranolol -steroid medicines like prednisone or cortisone This list may not describe all possible interactions. Give your health care provider a list of all the medicines, herbs,  non-prescription drugs, or dietary supplements you use. Also tell them if you smoke, drink alcohol, or use illegal drugs. Some items may interact with your medicine. What should I watch for while using this medicine? Visit your doctor or health care professional for regular checks on your progress. They will need to check the level of testosterone in your blood. This medicine may affect blood sugar levels. If you have diabetes, check with your doctor or health care professional before you change your diet or the dose of your diabetic medicine. This drug is banned from use in athletes by most athletic organizations. What side effects may I notice from receiving this medicine? Side effects that you should report to your doctor or health care professional as soon as possible: -allergic reactions like skin rash, itching or hives, swelling of the face, lips, or tongue -breast enlargement -breathing problems -changes in mood, especially anger, depression, or rage -dark urine -general ill feeling or flu-like symptoms -light-colored stools -loss of appetite, nausea -nausea, vomiting -right upper belly pain -stomach pain -swelling of ankles -too frequent or persistent erections -trouble passing urine or change in the amount of urine -unusually weak or tired -yellowing of the eyes or skin Additional side effects that can occur in women include: -deep or hoarse voice -facial hair growth -irregular menstrual periods Side effects that usually do not require medical attention (report to your doctor or health care professional if they continue or are bothersome): -acne -change in sex drive or performance -hair loss -headache This list may not describe all possible side effects. Call your doctor for medical advice about side effects. You may report side effects to FDA at 1-800-FDA-1088. Where should I keep my medicine? Keep  out of the reach of children. This medicine can be abused. Keep your  medicine in a safe place to protect it from theft. Do not share this medicine with anyone. Selling or giving away this medicine is dangerous and against the law. Store at room temperature between 20 and 25 degrees C (68 and 77 degrees F). Do not freeze. Protect from light. Follow the directions for the product you are prescribed. Throw away any unused medicine after the expiration date. NOTE: This sheet is a summary. It may not cover all possible information. If you have questions about this medicine, talk to your doctor, pharmacist, or health care provider.  2014, [employer]. (2007-04-22 16:13:46)

## 2013-03-24 ENCOUNTER — Ambulatory Visit (HOSPITAL_BASED_OUTPATIENT_CLINIC_OR_DEPARTMENT_OTHER): Payer: Medicaid Other

## 2013-03-24 ENCOUNTER — Other Ambulatory Visit (HOSPITAL_BASED_OUTPATIENT_CLINIC_OR_DEPARTMENT_OTHER): Payer: Medicaid Other | Admitting: Lab

## 2013-03-24 DIAGNOSIS — E349 Endocrine disorder, unspecified: Secondary | ICD-10-CM

## 2013-03-24 DIAGNOSIS — D509 Iron deficiency anemia, unspecified: Secondary | ICD-10-CM

## 2013-03-24 DIAGNOSIS — C9000 Multiple myeloma not having achieved remission: Secondary | ICD-10-CM

## 2013-03-24 DIAGNOSIS — E291 Testicular hypofunction: Secondary | ICD-10-CM

## 2013-03-24 DIAGNOSIS — D631 Anemia in chronic kidney disease: Secondary | ICD-10-CM

## 2013-03-24 DIAGNOSIS — N189 Chronic kidney disease, unspecified: Secondary | ICD-10-CM

## 2013-03-24 LAB — CBC WITH DIFFERENTIAL (CANCER CENTER ONLY)
BASO#: 0.1 10*3/uL (ref 0.0–0.2)
BASO%: 0.6 % (ref 0.0–2.0)
EOS ABS: 0.2 10*3/uL (ref 0.0–0.5)
EOS%: 3 % (ref 0.0–7.0)
HCT: 36.7 % — ABNORMAL LOW (ref 38.7–49.9)
HGB: 12 g/dL — ABNORMAL LOW (ref 13.0–17.1)
LYMPH#: 2.2 10*3/uL (ref 0.9–3.3)
LYMPH%: 26.7 % (ref 14.0–48.0)
MCH: 24.9 pg — ABNORMAL LOW (ref 28.0–33.4)
MCHC: 32.7 g/dL (ref 32.0–35.9)
MCV: 76 fL — ABNORMAL LOW (ref 82–98)
MONO#: 1 10*3/uL — AB (ref 0.1–0.9)
MONO%: 12.3 % (ref 0.0–13.0)
NEUT%: 57.4 % (ref 40.0–80.0)
NEUTROS ABS: 4.7 10*3/uL (ref 1.5–6.5)
PLATELETS: 227 10*3/uL (ref 145–400)
RBC: 4.81 10*6/uL (ref 4.20–5.70)
RDW: 16 % — AB (ref 11.1–15.7)
WBC: 8.1 10*3/uL (ref 4.0–10.0)

## 2013-03-24 MED ORDER — TESTOSTERONE CYPIONATE 200 MG/ML IM SOLN
INTRAMUSCULAR | Status: AC
  Filled 2013-03-24: qty 1

## 2013-03-24 MED ORDER — TESTOSTERONE CYPIONATE 200 MG/ML IM SOLN
200.0000 mg | INTRAMUSCULAR | Status: DC
  Administered 2013-03-24: 200 mg via INTRAMUSCULAR

## 2013-03-24 NOTE — Patient Instructions (Signed)
Testosterone injection What is this medicine? TESTOSTERONE (tes TOS ter one) is the main male hormone. It supports normal male development such as muscle growth, facial hair, and deep voice. It is used in males to treat low testosterone levels. This medicine may be used for other purposes; ask your health care provider or pharmacist if you have questions. COMMON BRAND NAME(S): Andro-L.A., Aveed, Delatestryl, Depo-Testosterone, Virilon What should I tell my health care provider before I take this medicine? They need to know if you have any of these conditions: -breast cancer -diabetes -heart disease -kidney disease -liver disease -lung disease -prostate cancer, enlargement -an unusual or allergic reaction to testosterone, other medicines, foods, dyes, or preservatives -pregnant or trying to get pregnant -breast-feeding How should I use this medicine? This medicine is for injection into a muscle. It is usually given by a health care professional in a hospital or clinic setting. Contact your pediatrician regarding the use of this medicine in children. While this medicine may be prescribed for children as young as 13 years of age for selected conditions, precautions do apply. Overdosage: If you think you have taken too much of this medicine contact a poison control center or emergency room at once. NOTE: This medicine is only for you. Do not share this medicine with others. What if I miss a dose? Try not to miss a dose. Your doctor or health care professional will tell you when your next injection is due. Notify the office if you are unable to keep an appointment. What may interact with this medicine? -medicines for diabetes -medicines that treat or prevent blood clots like warfarin -oxyphenbutazone -propranolol -steroid medicines like prednisone or cortisone This list may not describe all possible interactions. Give your health care provider a list of all the medicines, herbs,  non-prescription drugs, or dietary supplements you use. Also tell them if you smoke, drink alcohol, or use illegal drugs. Some items may interact with your medicine. What should I watch for while using this medicine? Visit your doctor or health care professional for regular checks on your progress. They will need to check the level of testosterone in your blood. This medicine may affect blood sugar levels. If you have diabetes, check with your doctor or health care professional before you change your diet or the dose of your diabetic medicine. This drug is banned from use in athletes by most athletic organizations. What side effects may I notice from receiving this medicine? Side effects that you should report to your doctor or health care professional as soon as possible: -allergic reactions like skin rash, itching or hives, swelling of the face, lips, or tongue -breast enlargement -breathing problems -changes in mood, especially anger, depression, or rage -dark urine -general ill feeling or flu-like symptoms -light-colored stools -loss of appetite, nausea -nausea, vomiting -right upper belly pain -stomach pain -swelling of ankles -too frequent or persistent erections -trouble passing urine or change in the amount of urine -unusually weak or tired -yellowing of the eyes or skin Additional side effects that can occur in women include: -deep or hoarse voice -facial hair growth -irregular menstrual periods Side effects that usually do not require medical attention (report to your doctor or health care professional if they continue or are bothersome): -acne -change in sex drive or performance -hair loss -headache This list may not describe all possible side effects. Call your doctor for medical advice about side effects. You may report side effects to FDA at 1-800-FDA-1088. Where should I keep my medicine? Keep  out of the reach of children. This medicine can be abused. Keep your  medicine in a safe place to protect it from theft. Do not share this medicine with anyone. Selling or giving away this medicine is dangerous and against the law. Store at room temperature between 20 and 25 degrees C (68 and 77 degrees F). Do not freeze. Protect from light. Follow the directions for the product you are prescribed. Throw away any unused medicine after the expiration date. NOTE: This sheet is a summary. It may not cover all possible information. If you have questions about this medicine, talk to your doctor, pharmacist, or health care provider.  2014, [employer]vier/[employer] Standard. (2007-04-22 16:13:46)

## 2013-04-07 ENCOUNTER — Ambulatory Visit (HOSPITAL_BASED_OUTPATIENT_CLINIC_OR_DEPARTMENT_OTHER): Payer: Medicaid Other

## 2013-04-07 VITALS — BP 140/69 | HR 96 | Temp 96.8°F | Resp 20

## 2013-04-07 DIAGNOSIS — E349 Endocrine disorder, unspecified: Secondary | ICD-10-CM

## 2013-04-07 DIAGNOSIS — E291 Testicular hypofunction: Secondary | ICD-10-CM

## 2013-04-07 MED ORDER — TESTOSTERONE CYPIONATE 200 MG/ML IM SOLN
200.0000 mg | INTRAMUSCULAR | Status: DC
Start: 2013-04-07 — End: 2013-04-07
  Administered 2013-04-07: 200 mg via INTRAMUSCULAR

## 2013-04-07 MED ORDER — TESTOSTERONE CYPIONATE 200 MG/ML IM SOLN
INTRAMUSCULAR | Status: AC
  Filled 2013-04-07: qty 1

## 2013-04-07 NOTE — Patient Instructions (Signed)
Testosterone injection What is this medicine? TESTOSTERONE (tes TOS ter one) is the main male hormone. It supports normal male development such as muscle growth, facial hair, and deep voice. It is used in males to treat low testosterone levels. This medicine may be used for other purposes; ask your health care provider or pharmacist if you have questions. COMMON BRAND NAME(S): Andro-L.A., Aveed, Delatestryl, Depo-Testosterone, Virilon What should I tell my health care provider before I take this medicine? They need to know if you have any of these conditions: -breast cancer -diabetes -heart disease -kidney disease -liver disease -lung disease -prostate cancer, enlargement -an unusual or allergic reaction to testosterone, other medicines, foods, dyes, or preservatives -pregnant or trying to get pregnant -breast-feeding How should I use this medicine? This medicine is for injection into a muscle. It is usually given by a health care professional in a hospital or clinic setting. Contact your pediatrician regarding the use of this medicine in children. While this medicine may be prescribed for children as young as 76 years of age for selected conditions, precautions do apply. Overdosage: If you think you have taken too much of this medicine contact a poison control center or emergency room at once. NOTE: This medicine is only for you. Do not share this medicine with others. What if I miss a dose? Try not to miss a dose. Your doctor or health care professional will tell you when your next injection is due. Notify the office if you are unable to keep an appointment. What may interact with this medicine? -medicines for diabetes -medicines that treat or prevent blood clots like warfarin -oxyphenbutazone -propranolol -steroid medicines like prednisone or cortisone This list may not describe all possible interactions. Give your health care provider a list of all the medicines, herbs,  non-prescription drugs, or dietary supplements you use. Also tell them if you smoke, drink alcohol, or use illegal drugs. Some items may interact with your medicine. What should I watch for while using this medicine? Visit your doctor or health care professional for regular checks on your progress. They will need to check the level of testosterone in your blood. This medicine may affect blood sugar levels. If you have diabetes, check with your doctor or health care professional before you change your diet or the dose of your diabetic medicine. This drug is banned from use in athletes by most athletic organizations. What side effects may I notice from receiving this medicine? Side effects that you should report to your doctor or health care professional as soon as possible: -allergic reactions like skin rash, itching or hives, swelling of the face, lips, or tongue -breast enlargement -breathing problems -changes in mood, especially anger, depression, or rage -dark urine -general ill feeling or flu-like symptoms -light-colored stools -loss of appetite, nausea -nausea, vomiting -right upper belly pain -stomach pain -swelling of ankles -too frequent or persistent erections -trouble passing urine or change in the amount of urine -unusually weak or tired -yellowing of the eyes or skin Additional side effects that can occur in women include: -deep or hoarse voice -facial hair growth -irregular menstrual periods Side effects that usually do not require medical attention (report to your doctor or health care professional if they continue or are bothersome): -acne -change in sex drive or performance -hair loss -headache This list may not describe all possible side effects. Call your doctor for medical advice about side effects. You may report side effects to FDA at 1-800-FDA-1088. Where should I keep my medicine? Keep  out of the reach of children. This medicine can be abused. Keep your  medicine in a safe place to protect it from theft. Do not share this medicine with anyone. Selling or giving away this medicine is dangerous and against the law. Store at room temperature between 20 and 25 degrees C (68 and 77 degrees F). Do not freeze. Protect from light. Follow the directions for the product you are prescribed. Throw away any unused medicine after the expiration date. NOTE: This sheet is a summary. It may not cover all possible information. If you have questions about this medicine, talk to your doctor, pharmacist, or health care provider.  2014, [employer]/Gold Standard. (2007-04-22 16:13:46)

## 2013-04-21 ENCOUNTER — Ambulatory Visit (HOSPITAL_BASED_OUTPATIENT_CLINIC_OR_DEPARTMENT_OTHER): Payer: Medicaid Other | Admitting: Lab

## 2013-04-21 ENCOUNTER — Ambulatory Visit (HOSPITAL_BASED_OUTPATIENT_CLINIC_OR_DEPARTMENT_OTHER): Payer: Medicaid Other | Admitting: Hematology & Oncology

## 2013-04-21 ENCOUNTER — Encounter: Payer: Self-pay | Admitting: Hematology & Oncology

## 2013-04-21 ENCOUNTER — Ambulatory Visit (HOSPITAL_BASED_OUTPATIENT_CLINIC_OR_DEPARTMENT_OTHER): Payer: Medicaid Other

## 2013-04-21 VITALS — BP 147/73 | HR 91 | Temp 98.4°F | Resp 18 | Ht 72.0 in | Wt 195.0 lb

## 2013-04-21 DIAGNOSIS — E349 Endocrine disorder, unspecified: Secondary | ICD-10-CM

## 2013-04-21 DIAGNOSIS — N189 Chronic kidney disease, unspecified: Secondary | ICD-10-CM

## 2013-04-21 DIAGNOSIS — D509 Iron deficiency anemia, unspecified: Secondary | ICD-10-CM

## 2013-04-21 DIAGNOSIS — R51 Headache: Secondary | ICD-10-CM

## 2013-04-21 DIAGNOSIS — E291 Testicular hypofunction: Secondary | ICD-10-CM

## 2013-04-21 DIAGNOSIS — C9000 Multiple myeloma not having achieved remission: Secondary | ICD-10-CM

## 2013-04-21 DIAGNOSIS — N289 Disorder of kidney and ureter, unspecified: Secondary | ICD-10-CM

## 2013-04-21 DIAGNOSIS — D649 Anemia, unspecified: Secondary | ICD-10-CM

## 2013-04-21 DIAGNOSIS — D631 Anemia in chronic kidney disease: Secondary | ICD-10-CM

## 2013-04-21 DIAGNOSIS — R220 Localized swelling, mass and lump, head: Secondary | ICD-10-CM

## 2013-04-21 LAB — CBC WITH DIFFERENTIAL (CANCER CENTER ONLY)
BASO#: 0.1 10*3/uL (ref 0.0–0.2)
BASO%: 0.5 % (ref 0.0–2.0)
EOS ABS: 0.2 10*3/uL (ref 0.0–0.5)
EOS%: 2 % (ref 0.0–7.0)
HCT: 37.4 % — ABNORMAL LOW (ref 38.7–49.9)
HGB: 12.2 g/dL — ABNORMAL LOW (ref 13.0–17.1)
LYMPH#: 1.8 10*3/uL (ref 0.9–3.3)
LYMPH%: 18.5 % (ref 14.0–48.0)
MCH: 24.6 pg — ABNORMAL LOW (ref 28.0–33.4)
MCHC: 32.6 g/dL (ref 32.0–35.9)
MCV: 76 fL — AB (ref 82–98)
MONO#: 0.9 10*3/uL (ref 0.1–0.9)
MONO%: 9 % (ref 0.0–13.0)
NEUT%: 70 % (ref 40.0–80.0)
NEUTROS ABS: 6.7 10*3/uL — AB (ref 1.5–6.5)
PLATELETS: 264 10*3/uL (ref 145–400)
RBC: 4.95 10*6/uL (ref 4.20–5.70)
RDW: 16.6 % — ABNORMAL HIGH (ref 11.1–15.7)
WBC: 9.5 10*3/uL (ref 4.0–10.0)

## 2013-04-21 LAB — CMP (CANCER CENTER ONLY)
ALT(SGPT): 23 U/L (ref 10–47)
AST: 25 U/L (ref 11–38)
Albumin: 3.6 g/dL (ref 3.3–5.5)
Alkaline Phosphatase: 75 U/L (ref 26–84)
BUN, Bld: 23 mg/dL — ABNORMAL HIGH (ref 7–22)
CALCIUM: 9.1 mg/dL (ref 8.0–10.3)
CO2: 31 meq/L (ref 18–33)
Chloride: 100 mEq/L (ref 98–108)
Creat: 1.8 mg/dl — ABNORMAL HIGH (ref 0.6–1.2)
GLUCOSE: 191 mg/dL — AB (ref 73–118)
POTASSIUM: 3.9 meq/L (ref 3.3–4.7)
SODIUM: 139 meq/L (ref 128–145)
TOTAL PROTEIN: 8.4 g/dL — AB (ref 6.4–8.1)
Total Bilirubin: 0.5 mg/dl (ref 0.20–1.60)

## 2013-04-21 MED ORDER — ACETAMINOPHEN 500 MG PO TABS
ORAL_TABLET | ORAL | Status: AC
  Filled 2013-04-21: qty 2

## 2013-04-21 MED ORDER — ACETAMINOPHEN 500 MG PO TABS
1000.0000 mg | ORAL_TABLET | Freq: Once | ORAL | Status: AC
  Administered 2013-04-21: 1000 mg via ORAL

## 2013-04-21 MED ORDER — TESTOSTERONE CYPIONATE 200 MG/ML IM SOLN
INTRAMUSCULAR | Status: AC
  Filled 2013-04-21: qty 1

## 2013-04-21 MED ORDER — HEPARIN SOD (PORK) LOCK FLUSH 100 UNIT/ML IV SOLN
500.0000 [IU] | Freq: Once | INTRAVENOUS | Status: AC
  Administered 2013-04-21: 500 [IU] via INTRAVENOUS
  Filled 2013-04-21: qty 5

## 2013-04-21 MED ORDER — SODIUM CHLORIDE 0.9 % IV SOLN
60.0000 mg | Freq: Once | INTRAVENOUS | Status: DC
  Filled 2013-04-21: qty 20

## 2013-04-21 MED ORDER — SODIUM CHLORIDE 0.9 % IV SOLN
60.0000 mg | Freq: Once | INTRAVENOUS | Status: DC
  Administered 2013-04-21: 60 mg via INTRAVENOUS
  Filled 2013-04-21: qty 20

## 2013-04-21 MED ORDER — TESTOSTERONE CYPIONATE 200 MG/ML IM SOLN
200.0000 mg | INTRAMUSCULAR | Status: DC
  Administered 2013-04-21: 200 mg via INTRAMUSCULAR

## 2013-04-21 MED ORDER — SODIUM CHLORIDE 0.9 % IJ SOLN
10.0000 mL | INTRAMUSCULAR | Status: DC | PRN
  Administered 2013-04-21: 10 mL via INTRAVENOUS
  Filled 2013-04-21: qty 10

## 2013-04-21 MED ORDER — SODIUM CHLORIDE 0.9 % IV SOLN
Freq: Once | INTRAVENOUS | Status: AC
  Administered 2013-04-21: 10:00:00 via INTRAVENOUS

## 2013-04-21 NOTE — Patient Instructions (Signed)
Testosterone This test is used to determine if your testosterone level is abnormal. This could be used to explain difficulty getting an erection (erectile dysfunction), inability of your partner to get pregnant (infertility), premature or delayed puberty if you are male, or the appearance of masculine physical features if you are male. PREPARATION FOR TEST A blood sample is obtained by inserting a needle into a vein in the arm. NORMAL FINDINGS  Free Testosterone: 0.3-2 pg/mL  % Free Testosterone: 0.1%-0.3% Total Testosterone:  7 mos-9 yrs (Tanner Stage I)  Male: Less than 30 ng/dL  Male: Less than 30 ng/dL  10-13 yrs (Tanner Stage II)  Male: Less than 300 ng/dL  Male: Less than 40 ng/dL  14-15 yrs (Tanner Stage III)  Male: 170-540 ng/dL  Male: Less than 60 ng/dL  16-19 yrs (Tanner Stage IV, V)  Male: 250-910 ng/dL  Male: Less than 70 ng/dL  20 yrs and over  Male: [phone] ng/dL  Male: Less than 70 ng/dL Ranges for normal findings may vary among different laboratories and hospitals. You should always check with your doctor after having lab work or other tests done to discuss the meaning of your test results and whether your values are considered within normal limits. MEANING OF TEST  Your caregiver will go over the test results with you and discuss the importance and meaning of your results, as well as treatment options and the need for additional tests if necessary. OBTAINING THE TEST RESULTS It is your responsibility to obtain your test results. Ask the lab or department performing the test when and how you will get your results. Document Released: 02/27/2004 Document Revised: 05/04/2011 Document Reviewed: 01/22/2008 [facility] Patient Information 2014 [place], [place]. Pamidronate injection What is this medicine? PAMIDRONATE (pa mi DROE nate) slows calcium loss from bones. It is used to treat high calcium blood levels from cancer or Paget's disease. It is  also used to treat bone pain and prevent fractures from certain cancers that have spread to the bone. This medicine may be used for other purposes; ask your health care provider or pharmacist if you have questions. COMMON BRAND NAME(S): Aredia What should I tell my health care provider before I take this medicine? They need to know if you have any of these conditions: -aspirin-sensitive asthma -dental disease -kidney disease -an unusual or allergic reaction to pamidronate, other medicines, foods, dyes, or preservatives -pregnant or trying to get pregnant -breast-feeding How should I use this medicine? This medicine is for infusion into a vein. It is given by a health care professional in a hospital or clinic setting. Talk to your pediatrician regarding the use of this medicine in children. This medicine is not approved for use in children. Overdosage: If you think you have taken too much of this medicine contact a poison control center or emergency room at once. NOTE: This medicine is only for you. Do not share this medicine with others. What if I miss a dose? This does not apply. What may interact with this medicine? -certain antibiotics given by injection -medicines for inflammation or pain like ibuprofen, naproxen -some diuretics like bumetanide, furosemide -cyclosporine -parathyroid hormone -tacrolimus -teriparatide -thalidomide This list may not describe all possible interactions. Give your health care provider a list of all the medicines, herbs, non-prescription drugs, or dietary supplements you use. Also tell them if you smoke, drink alcohol, or use illegal drugs. Some items may interact with your medicine. What should I watch for while using this medicine? Visit your doctor or  health care professional for regular checkups. It may be some time before you see the benefit from this medicine. Do not stop taking your medicine unless your doctor tells you to. Your doctor may order  blood tests or other tests to see how you are doing. Women should inform their doctor if they wish to become pregnant or think they might be pregnant. There is a potential for serious side effects to an unborn child. Talk to your health care professional or pharmacist for more information. You should make sure that you get enough calcium and vitamin D while you are taking this medicine. Discuss the foods you eat and the vitamins you take with your health care professional. Some people who take this medicine have severe bone, joint, and/or muscle pain. This medicine may also increase your risk for a broken thigh bone. Tell your doctor right away if you have pain in your upper leg or groin. Tell your doctor if you have any pain that does not go away or that gets worse. What side effects may I notice from receiving this medicine? Side effects that you should report to your doctor or health care professional as soon as possible: -allergic reactions like skin rash, itching or hives, swelling of the face, lips, or tongue -black or tarry stools -changes in vision -eye inflammation, pain -high blood pressure -jaw pain, especially burning or cramping -muscle weakness -numb, tingling pain -swelling of feet or hands -trouble passing urine or change in the amount of urine -unable to move easily Side effects that usually do not require medical attention (report to your doctor or health care professional if they continue or are bothersome): -bone, joint, or muscle pain -constipation -dizzy, drowsy -fever -headache -loss of appetite -nausea, vomiting -pain at site where injected This list may not describe all possible side effects. Call your doctor for medical advice about side effects. You may report side effects to FDA at 1-800-FDA-1088. Where should I keep my medicine? This drug is given in a hospital or clinic and will not be stored at home. NOTE: This sheet is a summary. It may not cover all  possible information. If you have questions about this medicine, talk to your doctor, pharmacist, or health care provider.  2014, Elsevier/Gold Standard. (2010-08-08 08:49:49)

## 2013-04-21 NOTE — Progress Notes (Signed)
  DIAGNOSIS:  1. IgG kappa myeloma. 2. Anemia secondary to renal insufficiency. 3. Intermittent iron-deficiency anemia. 4. Hypotestosteronemia.   CURRENT THERAPY: Aredia 60 mg IV q.2 months. 2. Aranesp 300 mcg subcu as needed for hemoglobin less than 11. 3. Depo-Testosterone 200 mg q.2 weeks. 4. IV iron as indicated.    INTERIM HISTORY:  AdrianMcmahon comes in for followup. He still bothered by his fibromyalgia. There's been his biggest issue. He does see a rheumatologist. His blood sugars have been a little on the high side.    As far as her myeloma goes, this has not been a problem for him. When we last saw him in early January, his Michael's bike was in stable 0.78 g/dL. His Ig G. level was 2090 mg/dL. I. June was 5.52 mg/dL.    His iron levels were checked the last time we saw him.  His ferritin is always high because of his fibromyalgia. His iron saturation was 45%.    He's had no fever. He's complaining of some swelling on the left side of his face. We'll see about doing a CT scan of his face. As to possible there may be some sinus issues.    He's had no problems with bowels or bladder. There is been no change in his medications.   PHYSICAL EXAMINATION:  Well-developed well-nourished African American gentleman in no obvious distress. Vital signs are temperature of 98.4. Pulse 91. Blood pressure 147/73. Weight is 195 pounds. Head and neck exam shows no ocular or oral lesions. There may be some slight swelling on the low side his face. There is no actual tenderness to sinuses. Neck is supple with no adenopathy. Lungs are clear bilaterally. Cardiac exam regular in rhythm with a normal S1-S2. There are no murmurs rubs or bruits. Abdomen is soft. There are good bowel sounds. There are no palpable masses. There is no palpable liver or spleen tip. Extremities shows no clubbing cyanosis or edema. Has good strength in his legs. Skin exam no rashes ecchymoses or petechia.  Neurological exam shows no focal neurological deficits.   LABORATORY STUDIES:  White cell count is 9.5. Hemoglobin 12.2. Platelet count 264.    IMPRESSION:  Mr. Rigler is a 63 year old gentleman with history of IgG kappa myeloma. We treated him for this. His levels are low and stable. I've never seen any problems with the myeloma recurrent because issues for him. We do give him the Aredia which does help his bones. I think this is important.    He has a fibro-myalgia which is a huge problem for him. I was there is more we can do about this.    He does get testosterone. His last level was 186. That's her weight is on the lower side. The testosterone injections to help him.    We will go ahead and get him back to see [place] in 2 more months. We will get a CT of his sinuses.   Volanda Napoleon, MD 04/21/2013

## 2013-04-25 LAB — KAPPA/LAMBDA LIGHT CHAINS
Kappa free light chain: 3.81 mg/dL — ABNORMAL HIGH (ref 0.33–1.94)
Kappa:Lambda Ratio: 1.07 (ref 0.26–1.65)
Lambda Free Lght Chn: 3.57 mg/dL — ABNORMAL HIGH (ref 0.57–2.63)

## 2013-04-25 LAB — PROTEIN ELECTROPHORESIS, SERUM, WITH REFLEX
ALPHA-1-GLOBULIN: 3 % (ref 2.9–4.9)
Albumin ELP: 51.2 % — ABNORMAL LOW (ref 55.8–66.1)
Alpha-2-Globulin: 11.8 % (ref 7.1–11.8)
Beta 2: 5.8 % (ref 3.2–6.5)
Beta Globulin: 5.3 % (ref 4.7–7.2)
Gamma Globulin: 22.9 % — ABNORMAL HIGH (ref 11.1–18.8)
M-SPIKE, %: 0.71 g/dL
TOTAL PROTEIN, SERUM ELECTROPHOR: 8.2 g/dL (ref 6.0–8.3)

## 2013-04-25 LAB — IGG, IGA, IGM
IGA: 486 mg/dL — AB (ref 68–379)
IGG (IMMUNOGLOBIN G), SERUM: 1820 mg/dL — AB (ref 650–1600)
IgM, Serum: 92 mg/dL (ref 41–251)

## 2013-04-25 LAB — IFE INTERPRETATION

## 2013-04-25 LAB — TRANSFERRIN RECEPTOR, SOLUABLE: Transferrin Receptor, Soluble: 1.09 mg/L (ref 0.76–1.76)

## 2013-04-25 LAB — TESTOSTERONE: TESTOSTERONE: 511 ng/dL (ref 300–890)

## 2013-04-27 ENCOUNTER — Ambulatory Visit (HOSPITAL_BASED_OUTPATIENT_CLINIC_OR_DEPARTMENT_OTHER)
Admission: RE | Admit: 2013-04-27 | Discharge: 2013-04-27 | Disposition: A | Discharge status: Home / Self Care | Payer: Medicaid Other | Source: Ambulatory Visit | Attending: Hematology & Oncology | Admitting: Hematology & Oncology

## 2013-04-27 DIAGNOSIS — R220 Localized swelling, mass and lump, head: Secondary | ICD-10-CM

## 2013-05-01 ENCOUNTER — Telehealth: Payer: Self-pay | Admitting: Hematology & Oncology

## 2013-05-01 ENCOUNTER — Ambulatory Visit (HOSPITAL_BASED_OUTPATIENT_CLINIC_OR_DEPARTMENT_OTHER): Admission: RE | Admit: 2013-05-01 | Payer: Medicaid Other | Source: Ambulatory Visit
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
## 2013-05-08 ENCOUNTER — Ambulatory Visit (HOSPITAL_BASED_OUTPATIENT_CLINIC_OR_DEPARTMENT_OTHER)
Admission: RE | Admit: 2013-05-08 | Discharge: 2013-05-08 | Disposition: A | Discharge status: Home / Self Care | Payer: Medicaid Other | Source: Ambulatory Visit | Attending: Hematology & Oncology | Admitting: Hematology & Oncology

## 2013-05-08 DIAGNOSIS — K029 Dental caries, unspecified: Secondary | ICD-10-CM | POA: Insufficient documentation

## 2013-05-08 DIAGNOSIS — I672 Cerebral atherosclerosis: Secondary | ICD-10-CM | POA: Insufficient documentation

## 2013-05-08 DIAGNOSIS — M47812 Spondylosis without myelopathy or radiculopathy, cervical region: Secondary | ICD-10-CM | POA: Insufficient documentation

## 2013-05-08 DIAGNOSIS — R220 Localized swelling, mass and lump, head: Secondary | ICD-10-CM | POA: Insufficient documentation

## 2013-05-08 DIAGNOSIS — R0602 Shortness of breath: Secondary | ICD-10-CM | POA: Insufficient documentation

## 2013-05-08 DIAGNOSIS — R221 Localized swelling, mass and lump, neck: Principal | ICD-10-CM

## 2013-05-09 ENCOUNTER — Telehealth: Payer: Self-pay | Admitting: *Deleted

## 2013-05-09 NOTE — Telephone Encounter (Signed)
Message copied by Rico Ala on Tue May 09, 2013  1:48 PM ------      Message from: Burney Gauze R      Created: Mon May 08, 2013  6:14 PM       Ct scan is negatiove for any problem.  Pete ------

## 2013-05-09 NOTE — Telephone Encounter (Signed)
Called patient to let him know that his CT scan was negative

## 2013-05-15 ENCOUNTER — Encounter (HOSPITAL_COMMUNITY): Payer: Self-pay | Admitting: Emergency Medicine

## 2013-05-15 ENCOUNTER — Emergency Department (HOSPITAL_COMMUNITY)
Admission: EM | Admit: 2013-05-15 | Discharge: 2013-05-15 | Disposition: A | Discharge status: Home / Self Care | Payer: Medicaid Other | Attending: Emergency Medicine | Admitting: Emergency Medicine

## 2013-05-15 DIAGNOSIS — Z8619 Personal history of other infectious and parasitic diseases: Secondary | ICD-10-CM | POA: Insufficient documentation

## 2013-05-15 DIAGNOSIS — Z7982 Long term (current) use of aspirin: Secondary | ICD-10-CM | POA: Insufficient documentation

## 2013-05-15 DIAGNOSIS — Z862 Personal history of diseases of the blood and blood-forming organs and certain disorders involving the immune mechanism: Secondary | ICD-10-CM | POA: Insufficient documentation

## 2013-05-15 DIAGNOSIS — Z8659 Personal history of other mental and behavioral disorders: Secondary | ICD-10-CM | POA: Insufficient documentation

## 2013-05-15 DIAGNOSIS — E1149 Type 2 diabetes mellitus with other diabetic neurological complication: Secondary | ICD-10-CM | POA: Insufficient documentation

## 2013-05-15 DIAGNOSIS — Z79899 Other long term (current) drug therapy: Secondary | ICD-10-CM | POA: Insufficient documentation

## 2013-05-15 DIAGNOSIS — M129 Arthropathy, unspecified: Secondary | ICD-10-CM | POA: Insufficient documentation

## 2013-05-15 DIAGNOSIS — Z87448 Personal history of other diseases of urinary system: Secondary | ICD-10-CM | POA: Insufficient documentation

## 2013-05-15 DIAGNOSIS — I10 Essential (primary) hypertension: Secondary | ICD-10-CM | POA: Insufficient documentation

## 2013-05-15 DIAGNOSIS — R531 Weakness: Secondary | ICD-10-CM

## 2013-05-15 DIAGNOSIS — Z8719 Personal history of other diseases of the digestive system: Secondary | ICD-10-CM | POA: Insufficient documentation

## 2013-05-15 DIAGNOSIS — R5381 Other malaise: Secondary | ICD-10-CM | POA: Insufficient documentation

## 2013-05-15 DIAGNOSIS — E1142 Type 2 diabetes mellitus with diabetic polyneuropathy: Secondary | ICD-10-CM | POA: Insufficient documentation

## 2013-05-15 DIAGNOSIS — R5383 Other fatigue: Principal | ICD-10-CM

## 2013-05-15 LAB — COMPREHENSIVE METABOLIC PANEL
ALBUMIN: 4.1 g/dL (ref 3.5–5.2)
ALK PHOS: 98 U/L (ref 39–117)
ALT: 16 U/L (ref 0–53)
AST: 25 U/L (ref 0–37)
BUN: 32 mg/dL — ABNORMAL HIGH (ref 6–23)
CHLORIDE: 97 meq/L (ref 96–112)
CO2: 25 meq/L (ref 19–32)
CREATININE: 2 mg/dL — AB (ref 0.50–1.35)
Calcium: 9.3 mg/dL (ref 8.4–10.5)
GFR calc Af Amer: 39 mL/min — ABNORMAL LOW (ref >90)
GFR, EST NON AFRICAN AMERICAN: 34 mL/min — AB (ref >90)
Glucose, Bld: 92 mg/dL (ref 70–99)
POTASSIUM: 4.1 meq/L (ref 3.7–5.3)
Sodium: 137 mEq/L (ref 137–147)
Total Bilirubin: 0.3 mg/dL (ref 0.3–1.2)
Total Protein: 9.2 g/dL — ABNORMAL HIGH (ref 6.0–8.3)

## 2013-05-15 LAB — CBC WITH DIFFERENTIAL/PLATELET
BASOS ABS: 0.1 10*3/uL (ref 0.0–0.1)
BASOS PCT: 1 % (ref 0–1)
Eosinophils Absolute: 0.2 10*3/uL (ref 0.0–0.7)
Eosinophils Relative: 3 % (ref 0–5)
HEMATOCRIT: 37.8 % — AB (ref 39.0–52.0)
HEMOGLOBIN: 12.7 g/dL — AB (ref 13.0–17.0)
LYMPHS PCT: 32 % (ref 12–46)
Lymphs Abs: 2.6 10*3/uL (ref 0.7–4.0)
MCH: 25.8 pg — ABNORMAL LOW (ref 26.0–34.0)
MCHC: 33.6 g/dL (ref 30.0–36.0)
MCV: 76.7 fL — ABNORMAL LOW (ref 78.0–100.0)
Monocytes Absolute: 0.8 10*3/uL (ref 0.1–1.0)
Monocytes Relative: 9 % (ref 3–12)
NEUTROS ABS: 4.4 10*3/uL (ref 1.7–7.7)
Neutrophils Relative %: 55 % (ref 43–77)
Platelets: 277 10*3/uL (ref 150–400)
RBC: 4.93 MIL/uL (ref 4.22–5.81)
RDW: 15.8 % — AB (ref 11.5–15.5)
WBC: 8 10*3/uL (ref 4.0–10.5)

## 2013-05-15 LAB — URINALYSIS, ROUTINE W REFLEX MICROSCOPIC
BILIRUBIN URINE: NEGATIVE
GLUCOSE, UA: NEGATIVE mg/dL
Hgb urine dipstick: NEGATIVE
KETONES UR: NEGATIVE mg/dL
Leukocytes, UA: NEGATIVE
Nitrite: NEGATIVE
PROTEIN: NEGATIVE mg/dL
Specific Gravity, Urine: 1.006 (ref 1.005–1.030)
UROBILINOGEN UA: 0.2 mg/dL (ref 0.0–1.0)
pH: 7 (ref 5.0–8.0)

## 2013-05-15 LAB — LIPASE, BLOOD: LIPASE: 28 U/L (ref 11–59)

## 2013-05-15 LAB — POC OCCULT BLOOD, ED: Fecal Occult Bld: NEGATIVE

## 2013-05-15 MED ORDER — ONDANSETRON HCL 4 MG/2ML IJ SOLN
4.0000 mg | Freq: Once | INTRAMUSCULAR | Status: AC
  Administered 2013-05-15: 4 mg via INTRAVENOUS
  Filled 2013-05-15: qty 2

## 2013-05-15 MED ORDER — FENTANYL CITRATE 0.05 MG/ML IJ SOLN
50.0000 ug | Freq: Once | INTRAMUSCULAR | Status: AC
  Administered 2013-05-15: 50 ug via INTRAVENOUS
  Filled 2013-05-15: qty 2

## 2013-05-15 MED ORDER — HEPARIN SOD (PORK) LOCK FLUSH 100 UNIT/ML IV SOLN
500.0000 [IU] | Freq: Once | INTRAVENOUS | Status: AC
Start: 2013-05-15 — End: 2013-05-15
  Administered 2013-05-15: 500 [IU]
  Filled 2013-05-15: qty 5

## 2013-05-15 MED ORDER — SODIUM CHLORIDE 0.9 % IJ SOLN
INTRAMUSCULAR | Status: AC
  Filled 2013-05-15: qty 10

## 2013-05-15 MED ORDER — SODIUM CHLORIDE 0.9 % IV BOLUS (SEPSIS)
1000.0000 mL | Freq: Once | INTRAVENOUS | Status: AC
  Administered 2013-05-15: 1000 mL via INTRAVENOUS

## 2013-05-15 NOTE — ED Notes (Signed)
Patient is aware that we need a urine specimen. Patient states he cannot use the restroom at this time. Will let us know when he does need to urinate.

## 2013-05-15 NOTE — ED Provider Notes (Signed)
CSN: [id number]     Arrival date & time 05/15/13  1438 History   First MD Initiated Contact with Patient 05/15/13 1642     Chief Complaint  Patient presents with  . Hypertension  . Dizziness     (Consider location/radiation/quality/duration/timing/severity/associated sxs/prior Treatment) Patient is a 63 y.o. male presenting with hypertension and dizziness.  Hypertension  Dizziness  Pt with history of multiple medical problems reports several day of not feeling well, lightheaded, nauseated but no vomiting, hard stools with ?blood. Seen at PCP office today and found to be hypertensive. He took an additional BP pill and came to the ED for evaluation. Has pain all over which is chronic for him.   Past Medical History  Diagnosis Date  . Multiple myeloma   . Hypertension   . Renal insufficiency   . Diverticulosis   . Hemorrhoids   . Type 2 diabetes mellitus   . Anxiety disorder   . Urinary retention   . Anemia   . Diabetic neuropathy   . Constipation   . Herpes zoster   . Arthritis   . Hyperlipidemia   . Myeloma 12/31/2010  . Arthralgia of multiple joints 12/31/2010  . Anemia, iron deficiency 03/12/2011  . 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 Hypotestosteronism 03/17/2012   Past Surgical History  Procedure Laterality Date  . Knee arthroscopy      left  . Portacath placement    . Cataract extraction      bi-lateral  . Transurethral resection of prostate  01/2011    Dr. at [facility] in [place]  . Colonoscopy    . Esophagogastroduodenoscopy    . Colonoscopy  07/13/2011    Procedure: COLONOSCOPY;  Surgeon: Ladene Artist, MD,FACG;  Location: [facility];  Service: Endoscopy;  Laterality: N/A;   Family History  Problem Relation Age of Onset  . Breast cancer Sister   . Breast cancer Mother   . Diabetes Sister   . Bone cancer Father   . Colon cancer Neg Hx   . Heart disease Sister   . Heart disease Mother   . Heart disease Daughter   . Throat cancer Brother    . Stomach cancer Brother    History  Substance Use Topics  . Smoking status: Never Smoker   . Smokeless tobacco: Never Used     Comment: never used product  . Alcohol Use: No    Review of Systems  Neurological: Positive for dizziness.    All other systems reviewed and are negative except as noted in HPI.    Allergies  Iodine  Home Medications   Current Outpatient Rx  Name  Route  Sig  Dispense  Refill  . amLODipine (NORVASC) 10 MG tablet   Oral   Take 10 mg by mouth daily.           Marland Kitchen aspirin 81 MG tablet   Oral   Take 81 mg by mouth daily.         Marland Kitchen CARAFATE 1 GM/10ML suspension   Oral   Take 1 g by mouth 2 (two) times daily.          . ergocalciferol (VITAMIN D2) 50000 UNITS capsule   Oral   Take 50,000 Units by mouth once a week.           . furosemide (LASIX) 20 MG tablet   Oral   Take 20 mg by mouth daily.           Marland Kitchen  glipiZIDE (GLUCOTROL) 5 MG tablet   Oral   Take 5 mg by mouth daily.          . hydrALAZINE (APRESOLINE) 25 MG tablet   Oral   Take 25 mg by mouth 3 (three) times daily.         . Hypromellose (NATURAL BALANCE TEARS OP)   Ophthalmic   Apply to eye every morning.         . meclizine (ANTIVERT) 50 MG tablet   Oral   Take 0.5 tablets (25 mg total) by mouth 4 (four) times daily as needed for dizziness.   30 tablet   0   . methocarbamol (ROBAXIN) 500 MG tablet   Oral   Take 500 mg by mouth 2 (two) times daily as needed for muscle spasms.          Marland Kitchen oxyCODONE (OXYCONTIN) 80 MG 12 hr tablet   Oral   Take 80 mg by mouth 3 (three) times daily.         Marland Kitchen oxyCODONE-acetaminophen (PERCOCET) 10-325 MG per tablet   Oral   Take 1 tablet by mouth every 4 (four) hours as needed. Pain         . polyethylene glycol (MIRALAX / GLYCOLAX) packet   Oral   Take 17 g by mouth daily.         . potassium chloride SA (K-DUR,KLOR-CON) 20 MEQ tablet   Oral   Take 20 mEq by mouth as needed (takes with furosemide).           . Tamsulosin HCl (FLOMAX) 0.4 MG CAPS   Oral   Take 0.4 mg by mouth daily.          Marland Kitchen triamterene-hydrochlorothiazide (MAXZIDE-25) 37.5-25 MG per tablet   Oral   Take 1 tablet by mouth daily.            BP 162/75  Pulse 86  Temp(Src) 99.2 F (37.3 C) (Rectal)  Resp 19  SpO2 100% Physical Exam  Nursing note and vitals reviewed. Constitutional: He is oriented to person, place, and time. He appears well-developed and well-nourished.  HENT:  Head: Normocephalic and atraumatic.  Eyes: EOM are normal. Pupils are equal, round, and reactive to light.  Neck: Normal range of motion. Neck supple.  Cardiovascular: Normal rate, normal heart sounds and intact distal pulses.   Pulmonary/Chest: Effort normal and breath sounds normal.  Abdominal: Bowel sounds are normal. He exhibits no distension. There is no tenderness.  Musculoskeletal: Normal range of motion. He exhibits no edema and no tenderness.  Neurological: He is alert and oriented to person, place, and time. He has normal strength. No cranial nerve deficit or sensory deficit.  Skin: Skin is warm and dry. No rash noted.  Psychiatric: He has a normal mood and affect.    ED Course  Procedures (including critical care time) Labs Review Labs Reviewed  CBC WITH DIFFERENTIAL - Abnormal; Notable for the following:    Hemoglobin 12.7 (*)    HCT 37.8 (*)    MCV 76.7 (*)    MCH 25.8 (*)    RDW 15.8 (*)    All other components within normal limits  COMPREHENSIVE METABOLIC PANEL - Abnormal; Notable for the following:    BUN 32 (*)    Creatinine, Ser 2.00 (*)    Total Protein 9.2 (*)    GFR calc non Af Amer 34 (*)    GFR calc Af Amer 39 (*)    All other components  within normal limits  URINALYSIS, ROUTINE W REFLEX MICROSCOPIC  LIPASE, BLOOD  POC OCCULT BLOOD, ED   Imaging Review No results found.   EKG Interpretation None      MDM   Final diagnoses:  General weakness    Labs reviewed and unremarkable. Pt feeling  better with Zofran and IVF and ready to go home. Advised close PCP followup.     Charles B. Karle Starch, MD 05/15/13 2322

## 2013-05-15 NOTE — ED Notes (Signed)
Pt states that his Doctor sent him her "because I am about to have a stroke". Pt states that he feels horrible and his blood pressure is high. Pt is cancer pt and last chemo treatment was a month ago. Pt takes BP meds and states he did take today.

## 2013-05-15 NOTE — ED Notes (Signed)
Pt denies chest pain and shortness of breath but has pain all over from his bone cancer. He states " it feels like a Teacher, English as a foreign language hitting me". He also reports having rectal leakage that is dark and is suppose to see a doctor on the 26.

## 2013-05-15 NOTE — ED Notes (Signed)
IV team re-paged for port access.  Pt [place] IV causing pain meds not given through IV d/t pain

## 2013-05-15 NOTE — ED Notes (Signed)
CBG registered 97  On ED Glucometer

## 2013-05-15 NOTE — Discharge Instructions (Signed)

## 2013-05-15 NOTE — ED Notes (Signed)
IV team at bedside 

## 2013-05-15 NOTE — ED Notes (Signed)
Offered IM pain medications pain declined at this time.

## 2013-05-19 ENCOUNTER — Ambulatory Visit: Payer: Medicaid Other

## 2013-05-31 ENCOUNTER — Telehealth: Payer: Self-pay | Admitting: Hematology & Oncology

## 2013-05-31 NOTE — Telephone Encounter (Signed)
Pt made 4-9 inj appointment for missed 3-27

## 2013-06-01 ENCOUNTER — Ambulatory Visit (HOSPITAL_BASED_OUTPATIENT_CLINIC_OR_DEPARTMENT_OTHER): Payer: Medicaid Other

## 2013-06-19 ENCOUNTER — Encounter: Payer: Self-pay | Admitting: Hematology & Oncology

## 2013-06-19 ENCOUNTER — Ambulatory Visit (HOSPITAL_BASED_OUTPATIENT_CLINIC_OR_DEPARTMENT_OTHER): Payer: Medicaid Other | Admitting: Lab

## 2013-06-19 ENCOUNTER — Ambulatory Visit (HOSPITAL_BASED_OUTPATIENT_CLINIC_OR_DEPARTMENT_OTHER): Payer: Medicaid Other

## 2013-06-19 ENCOUNTER — Ambulatory Visit (HOSPITAL_BASED_OUTPATIENT_CLINIC_OR_DEPARTMENT_OTHER): Payer: Medicaid Other | Admitting: Hematology & Oncology

## 2013-06-19 ENCOUNTER — Encounter (INDEPENDENT_AMBULATORY_CARE_PROVIDER_SITE_OTHER): Payer: Self-pay

## 2013-06-19 VITALS — BP 133/73 | HR 104 | Temp 98.7°F | Resp 18 | Ht 70.0 in | Wt 192.0 lb

## 2013-06-19 DIAGNOSIS — C9000 Multiple myeloma not having achieved remission: Secondary | ICD-10-CM

## 2013-06-19 DIAGNOSIS — D509 Iron deficiency anemia, unspecified: Secondary | ICD-10-CM

## 2013-06-19 DIAGNOSIS — E119 Type 2 diabetes mellitus without complications: Secondary | ICD-10-CM

## 2013-06-19 DIAGNOSIS — E349 Endocrine disorder, unspecified: Secondary | ICD-10-CM

## 2013-06-19 DIAGNOSIS — E291 Testicular hypofunction: Secondary | ICD-10-CM

## 2013-06-19 LAB — CBC WITH DIFFERENTIAL (CANCER CENTER ONLY)
BASO#: 0 10*3/uL (ref 0.0–0.2)
BASO%: 0.4 % (ref 0.0–2.0)
EOS ABS: 0.3 10*3/uL (ref 0.0–0.5)
EOS%: 2.7 % (ref 0.0–7.0)
HEMATOCRIT: 33 % — AB (ref 38.7–49.9)
HGB: 10.7 g/dL — ABNORMAL LOW (ref 13.0–17.1)
LYMPH#: 1.8 10*3/uL (ref 0.9–3.3)
LYMPH%: 19.4 % (ref 14.0–48.0)
MCH: 24.9 pg — AB (ref 28.0–33.4)
MCHC: 32.4 g/dL (ref 32.0–35.9)
MCV: 77 fL — ABNORMAL LOW (ref 82–98)
MONO#: 0.8 10*3/uL (ref 0.1–0.9)
MONO%: 8 % (ref 0.0–13.0)
NEUT#: 6.5 10*3/uL (ref 1.5–6.5)
NEUT%: 69.5 % (ref 40.0–80.0)
Platelets: 215 10*3/uL (ref 145–400)
RBC: 4.3 10*6/uL (ref 4.20–5.70)
RDW: 15.3 % (ref 11.1–15.7)
WBC: 9.4 10*3/uL (ref 4.0–10.0)

## 2013-06-19 LAB — CMP (CANCER CENTER ONLY)
ALBUMIN: 3.6 g/dL (ref 3.3–5.5)
ALT(SGPT): 31 U/L (ref 10–47)
AST: 33 U/L (ref 11–38)
Alkaline Phosphatase: 72 U/L (ref 26–84)
BUN, Bld: 41 mg/dL — ABNORMAL HIGH (ref 7–22)
CALCIUM: 9.3 mg/dL (ref 8.0–10.3)
CHLORIDE: 101 meq/L (ref 98–108)
CO2: 32 mEq/L (ref 18–33)
Creat: 2.3 mg/dl — ABNORMAL HIGH (ref 0.6–1.2)
GLUCOSE: 188 mg/dL — AB (ref 73–118)
POTASSIUM: 3.6 meq/L (ref 3.3–4.7)
Sodium: 140 mEq/L (ref 128–145)
Total Bilirubin: 0.4 mg/dl (ref 0.20–1.60)
Total Protein: 8.8 g/dL — ABNORMAL HIGH (ref 6.4–8.1)

## 2013-06-19 LAB — IRON AND TIBC CHCC
%SAT: 48 % (ref 20–55)
IRON: 97 ug/dL (ref 42–163)
TIBC: 203 ug/dL (ref 202–409)
UIBC: 106 ug/dL — ABNORMAL LOW (ref 117–376)

## 2013-06-19 LAB — FERRITIN CHCC: Ferritin: 1467 ng/ml — ABNORMAL HIGH (ref 22–316)

## 2013-06-19 MED ORDER — HEPARIN SOD (PORK) LOCK FLUSH 100 UNIT/ML IV SOLN
500.0000 [IU] | Freq: Once | INTRAVENOUS | Status: AC
  Administered 2013-06-19: 500 [IU] via INTRAVENOUS
  Filled 2013-06-19: qty 5

## 2013-06-19 MED ORDER — TESTOSTERONE CYPIONATE 200 MG/ML IM SOLN
INTRAMUSCULAR | Status: AC
  Filled 2013-06-19: qty 1

## 2013-06-19 MED ORDER — SODIUM CHLORIDE 0.9 % IV SOLN
INTRAVENOUS | Status: DC
  Administered 2013-06-19: 09:00:00 via INTRAVENOUS

## 2013-06-19 MED ORDER — MORPHINE SULFATE 4 MG/ML IJ SOLN
4.0000 mg | INTRAMUSCULAR | Status: DC | PRN
  Administered 2013-06-19: 4 mg via INTRAVENOUS

## 2013-06-19 MED ORDER — TESTOSTERONE CYPIONATE 200 MG/ML IM SOLN
200.0000 mg | INTRAMUSCULAR | Status: DC
  Administered 2013-06-19: 200 mg via INTRAMUSCULAR

## 2013-06-19 MED ORDER — SODIUM CHLORIDE 0.9 % IJ SOLN
10.0000 mL | INTRAMUSCULAR | Status: DC | PRN
  Administered 2013-06-19: 10 mL via INTRAVENOUS
  Filled 2013-06-19: qty 10

## 2013-06-19 MED ORDER — MORPHINE SULFATE 4 MG/ML IJ SOLN
INTRAMUSCULAR | Status: AC
  Filled 2013-06-19: qty 1

## 2013-06-19 MED ORDER — DARBEPOETIN ALFA-POLYSORBATE 300 MCG/0.6ML IJ SOLN
300.0000 ug | Freq: Once | INTRAMUSCULAR | Status: AC
  Administered 2013-06-19: 300 ug via SUBCUTANEOUS

## 2013-06-19 MED ORDER — SODIUM CHLORIDE 0.9 % IV SOLN
60.0000 mg | Freq: Once | INTRAVENOUS | Status: AC
  Administered 2013-06-19: 60 mg via INTRAVENOUS
  Filled 2013-06-19: qty 10

## 2013-06-19 MED ORDER — DARBEPOETIN ALFA-POLYSORBATE 300 MCG/0.6ML IJ SOLN
INTRAMUSCULAR | Status: AC
  Filled 2013-06-19: qty 0.6
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
## 2013-06-19 NOTE — Patient Instructions (Signed)
Pamidronate injection What is this medicine? PAMIDRONATE (pa mi DROE nate) slows calcium loss from bones. It is used to treat high calcium blood levels from cancer or Paget's disease. It is also used to treat bone pain and prevent fractures from certain cancers that have spread to the bone. This medicine may be used for other purposes; ask your health care provider or pharmacist if you have questions. COMMON BRAND NAME(S): Aredia What should I tell my health care provider before I take this medicine? They need to know if you have any of these conditions: -aspirin-sensitive asthma -dental disease -kidney disease -an unusual or allergic reaction to pamidronate, other medicines, foods, dyes, or preservatives -pregnant or trying to get pregnant -breast-feeding How should I use this medicine? This medicine is for infusion into a vein. It is given by a health care professional in a hospital or clinic setting. Talk to your pediatrician regarding the use of this medicine in children. This medicine is not approved for use in children. Overdosage: If you think you have taken too much of this medicine contact a poison control center or emergency room at once. NOTE: This medicine is only for you. Do not share this medicine with others. What if I miss a dose? This does not apply. What may interact with this medicine? -certain antibiotics given by injection -medicines for inflammation or pain like ibuprofen, naproxen -some diuretics like bumetanide, furosemide -cyclosporine -parathyroid hormone -tacrolimus -teriparatide -thalidomide This list may not describe all possible interactions. Give your health care provider a list of all the medicines, herbs, non-prescription drugs, or dietary supplements you use. Also tell them if you smoke, drink alcohol, or use illegal drugs. Some items may interact with your medicine. What should I watch for while using this medicine? Visit your doctor or health care  professional for regular checkups. It may be some time before you see the benefit from this medicine. Do not stop taking your medicine unless your doctor tells you to. Your doctor may order blood tests or other tests to see how you are doing. Women should inform their doctor if they wish to become pregnant or think they might be pregnant. There is a potential for serious side effects to an unborn child. Talk to your health care professional or pharmacist for more information. You should make sure that you get enough calcium and vitamin D while you are taking this medicine. Discuss the foods you eat and the vitamins you take with your health care professional. Some people who take this medicine have severe bone, joint, and/or muscle pain. This medicine may also increase your risk for a broken thigh bone. Tell your doctor right away if you have pain in your upper leg or groin. Tell your doctor if you have any pain that does not go away or that gets worse. What side effects may I notice from receiving this medicine? Side effects that you should report to your doctor or health care professional as soon as possible: -allergic reactions like skin rash, itching or hives, swelling of the face, lips, or tongue -black or tarry stools -changes in vision -eye inflammation, pain -high blood pressure -jaw pain, especially burning or cramping -muscle weakness -numb, tingling pain -swelling of feet or hands -trouble passing urine or change in the amount of urine -unable to move easily Side effects that usually do not require medical attention (report to your doctor or health care professional if they continue or are bothersome): -bone, joint, or muscle pain -constipation -dizzy, drowsy -  fever -headache -loss of appetite -nausea, vomiting -pain at site where injected This list may not describe all possible side effects. Call your doctor for medical advice about side effects. You may report side effects to  FDA at 1-800-FDA-1088. Where should I keep my medicine? This drug is given in a hospital or clinic and will not be stored at home. NOTE: This sheet is a summary. It may not cover all possible information. If you have questions about this medicine, talk to your doctor, pharmacist, or health care provider.  2014, [employer]. (2010-08-08 08:49:49) Darbepoetin Alfa injection What is this medicine? DARBEPOETIN ALFA (dar be POE e tin AL fa) helps your body make more red blood cells. It is used to treat anemia caused by chronic kidney failure and chemotherapy. This medicine may be used for other purposes; ask your health care provider or pharmacist if you have questions. COMMON BRAND NAME(S): Aranesp What should I tell my health care provider before I take this medicine? They need to know if you have any of these conditions: -blood clotting disorders or history of blood clots -cancer patient not on chemotherapy -cystic fibrosis -heart disease, such as angina, heart failure, or a history of a heart attack -hemoglobin level of 12 g/dL or greater -high blood pressure -low levels of folate, iron, or vitamin B12 -seizures -an unusual or allergic reaction to darbepoetin, erythropoietin, albumin, hamster proteins, latex, other medicines, foods, dyes, or preservatives -pregnant or trying to get pregnant -breast-feeding How should I use this medicine? This medicine is for injection into a vein or under the skin. It is usually given by a health care professional in a hospital or clinic setting. If you get this medicine at home, you will be taught how to prepare and give this medicine. Do not shake the solution before you withdraw a dose. Use exactly as directed. Take your medicine at regular intervals. Do not take your medicine more often than directed. It is important that you put your used needles and syringes in a special sharps container. Do not put them in a trash can. If you do not have a  sharps container, call your pharmacist or healthcare provider to get one. Talk to your pediatrician regarding the use of this medicine in children. While this medicine may be used in children as young as 1 year for selected conditions, precautions do apply. Overdosage: If you think you have taken too much of this medicine contact a poison control center or emergency room at once. NOTE: This medicine is only for you. Do not share this medicine with others. What if I miss a dose? If you miss a dose, take it as soon as you can. If it is almost time for your next dose, take only that dose. Do not take double or extra doses. What may interact with this medicine? Do not take this medicine with any of the following medications: -epoetin alfa This list may not describe all possible interactions. Give your health care provider a list of all the medicines, herbs, non-prescription drugs, or dietary supplements you use. Also tell them if you smoke, drink alcohol, or use illegal drugs. Some items may interact with your medicine. What should I watch for while using this medicine? Visit your prescriber or health care professional for regular checks on your progress and for the needed blood tests and blood pressure measurements. It is especially important for the doctor to make sure your hemoglobin level is in the desired range, to limit the risk  of potential side effects and to give you the best benefit. Keep all appointments for any recommended tests. Check your blood pressure as directed. Ask your doctor what your blood pressure should be and when you should contact him or her. As your body makes more red blood cells, you may need to take iron, folic acid, or vitamin B supplements. Ask your doctor or health care provider which products are right for you. If you have kidney disease continue dietary restrictions, even though this medication can make you feel better. Talk with your doctor or health care professional  about the foods you eat and the vitamins that you take. What side effects may I notice from receiving this medicine? Side effects that you should report to your doctor or health care professional as soon as possible: -allergic reactions like skin rash, itching or hives, swelling of the face, lips, or tongue -breathing problems -changes in vision -chest pain -confusion, trouble speaking or understanding -feeling faint or lightheaded, falls -high blood pressure -muscle aches or pains -pain, swelling, warmth in the leg -rapid weight gain -severe headaches -sudden numbness or weakness of the face, arm or leg -trouble walking, dizziness, loss of balance or coordination -seizures (convulsions) -swelling of the ankles, feet, hands -unusually weak or tired Side effects that usually do not require medical attention (report to your doctor or health care professional if they continue or are bothersome): -diarrhea -fever, chills (flu-like symptoms) -headaches -nausea, vomiting -redness, stinging, or swelling at site where injected This list may not describe all possible side effects. Call your doctor for medical advice about side effects. You may report side effects to FDA at 1-800-FDA-1088. Where should I keep my medicine? Keep out of the reach of children. Store in a refrigerator between 2 and 8 degrees C (36 and 46 degrees F). Do not freeze. Do not shake. Throw away any unused portion if using a single-dose vial. Throw away any unused medicine after the expiration date. NOTE: This sheet is a summary. It may not cover all possible information. If you have questions about this medicine, talk to your doctor, pharmacist, or health care provider.  2014, [employer]. (2008-01-24 10:23:57) Testosterone injection What is this medicine? TESTOSTERONE (tes TOS ter one) is the main male hormone. It supports normal male development such as muscle growth, facial hair, and deep voice. It is used  in males to treat low testosterone levels. This medicine may be used for other purposes; ask your health care provider or pharmacist if you have questions. COMMON BRAND NAME(S): Andro-L.A., Aveed, Delatestryl, Depo-Testosterone, Virilon What should I tell my health care provider before I take this medicine? They need to know if you have any of these conditions: -breast cancer -diabetes -heart disease -kidney disease -liver disease -lung disease -prostate cancer, enlargement -an unusual or allergic reaction to testosterone, other medicines, foods, dyes, or preservatives -pregnant or trying to get pregnant -breast-feeding How should I use this medicine? This medicine is for injection into a muscle. It is usually given by a health care professional in a hospital or clinic setting. Contact your pediatrician regarding the use of this medicine in children. While this medicine may be prescribed for children as young as 43 years of age for selected conditions, precautions do apply. Overdosage: If you think you have taken too much of this medicine contact a poison control center or emergency room at once. NOTE: This medicine is only for you. Do not share this medicine with others. What if I miss a  dose? Try not to miss a dose. Your doctor or health care professional will tell you when your next injection is due. Notify the office if you are unable to keep an appointment. What may interact with this medicine? -medicines for diabetes -medicines that treat or prevent blood clots like warfarin -oxyphenbutazone -propranolol -steroid medicines like prednisone or cortisone This list may not describe all possible interactions. Give your health care provider a list of all the medicines, herbs, non-prescription drugs, or dietary supplements you use. Also tell them if you smoke, drink alcohol, or use illegal drugs. Some items may interact with your medicine. What should I watch for while using this  medicine? Visit your doctor or health care professional for regular checks on your progress. They will need to check the level of testosterone in your blood. This medicine may affect blood sugar levels. If you have diabetes, check with your doctor or health care professional before you change your diet or the dose of your diabetic medicine. This drug is banned from use in athletes by most athletic organizations. What side effects may I notice from receiving this medicine? Side effects that you should report to your doctor or health care professional as soon as possible: -allergic reactions like skin rash, itching or hives, swelling of the face, lips, or tongue -breast enlargement -breathing problems -changes in mood, especially anger, depression, or rage -dark urine -general ill feeling or flu-like symptoms -light-colored stools -loss of appetite, nausea -nausea, vomiting -right upper belly pain -stomach pain -swelling of ankles -too frequent or persistent erections -trouble passing urine or change in the amount of urine -unusually weak or tired -yellowing of the eyes or skin Additional side effects that can occur in women include: -deep or hoarse voice -facial hair growth -irregular menstrual periods Side effects that usually do not require medical attention (report to your doctor or health care professional if they continue or are bothersome): -acne -change in sex drive or performance -hair loss -headache This list may not describe all possible side effects. Call your doctor for medical advice about side effects. You may report side effects to FDA at 1-800-FDA-1088. Where should I keep my medicine? Keep out of the reach of children. This medicine can be abused. Keep your medicine in a safe place to protect it from theft. Do not share this medicine with anyone. Selling or giving away this medicine is dangerous and against the law. Store at room temperature between 20 and 25 degrees  C (68 and 77 degrees F). Do not freeze. Protect from light. Follow the directions for the product you are prescribed. Throw away any unused medicine after the expiration date. NOTE: This sheet is a summary. It may not cover all possible information. If you have questions about this medicine, talk to your doctor, pharmacist, or health care provider.  2014, [employer]. (2007-04-22 16:13:46)

## 2013-06-21 ENCOUNTER — Telehealth: Payer: Self-pay | Admitting: Hematology & Oncology

## 2013-06-21 NOTE — Telephone Encounter (Signed)
Pt aware of 5-11,26 and 6-8 injections

## 2013-06-26 LAB — PROTEIN ELECTROPHORESIS, SERUM, WITH REFLEX
ALPHA-1-GLOBULIN: 3.6 % (ref 2.9–4.9)
Albumin ELP: 48.6 % — ABNORMAL LOW (ref 55.8–66.1)
Alpha-2-Globulin: 11.6 % (ref 7.1–11.8)
Beta 2: 5.8 % (ref 3.2–6.5)
Beta Globulin: 5.3 % (ref 4.7–7.2)
GAMMA GLOBULIN: 25.1 % — AB (ref 11.1–18.8)
M-SPIKE, %: 0.81 g/dL
TOTAL PROTEIN, SERUM ELECTROPHOR: 8.1 g/dL (ref 6.0–8.3)

## 2013-06-26 LAB — KAPPA/LAMBDA LIGHT CHAINS
KAPPA LAMBDA RATIO: 1.06 (ref 0.26–1.65)
Kappa free light chain: 3.63 mg/dL — ABNORMAL HIGH (ref 0.33–1.94)
Lambda Free Lght Chn: 3.44 mg/dL — ABNORMAL HIGH (ref 0.57–2.63)

## 2013-06-26 LAB — IGG, IGA, IGM
IGM, SERUM: 70 mg/dL (ref 41–251)
IgA: 368 mg/dL (ref 68–379)
IgG (Immunoglobin G), Serum: 1850 mg/dL — ABNORMAL HIGH (ref 650–1600)

## 2013-06-26 LAB — TESTOSTERONE: Testosterone: 168 ng/dL — ABNORMAL LOW (ref 300–890)

## 2013-06-26 LAB — IFE INTERPRETATION

## 2013-07-03 ENCOUNTER — Other Ambulatory Visit (HOSPITAL_BASED_OUTPATIENT_CLINIC_OR_DEPARTMENT_OTHER): Payer: Medicaid Other | Admitting: Lab

## 2013-07-03 ENCOUNTER — Ambulatory Visit (HOSPITAL_BASED_OUTPATIENT_CLINIC_OR_DEPARTMENT_OTHER): Payer: Medicaid Other

## 2013-07-03 VITALS — BP 161/85 | HR 92 | Temp 97.1°F | Resp 12

## 2013-07-03 DIAGNOSIS — C9000 Multiple myeloma not having achieved remission: Secondary | ICD-10-CM

## 2013-07-03 DIAGNOSIS — E349 Endocrine disorder, unspecified: Secondary | ICD-10-CM

## 2013-07-03 DIAGNOSIS — E291 Testicular hypofunction: Secondary | ICD-10-CM

## 2013-07-03 LAB — CBC WITH DIFFERENTIAL (CANCER CENTER ONLY)
BASO#: 0 10*3/uL (ref 0.0–0.2)
BASO%: 0.5 % (ref 0.0–2.0)
EOS ABS: 0.3 10*3/uL (ref 0.0–0.5)
EOS%: 3.7 % (ref 0.0–7.0)
HCT: 36.5 % — ABNORMAL LOW (ref 38.7–49.9)
HGB: 12 g/dL — ABNORMAL LOW (ref 13.0–17.1)
LYMPH#: 1.7 10*3/uL (ref 0.9–3.3)
LYMPH%: 22.8 % (ref 14.0–48.0)
MCH: 25.6 pg — ABNORMAL LOW (ref 28.0–33.4)
MCHC: 32.9 g/dL (ref 32.0–35.9)
MCV: 78 fL — ABNORMAL LOW (ref 82–98)
MONO#: 0.9 10*3/uL (ref 0.1–0.9)
MONO%: 11.7 % (ref 0.0–13.0)
NEUT%: 61.3 % (ref 40.0–80.0)
NEUTROS ABS: 4.6 10*3/uL (ref 1.5–6.5)
PLATELETS: 219 10*3/uL (ref 145–400)
RBC: 4.68 10*6/uL (ref 4.20–5.70)
RDW: 17.3 % — ABNORMAL HIGH (ref 11.1–15.7)
WBC: 7.5 10*3/uL (ref 4.0–10.0)

## 2013-07-03 MED ORDER — TESTOSTERONE CYPIONATE 200 MG/ML IM SOLN
200.0000 mg | INTRAMUSCULAR | Status: DC
  Administered 2013-07-03: 200 mg via INTRAMUSCULAR

## 2013-07-03 MED ORDER — TESTOSTERONE CYPIONATE 200 MG/ML IM SOLN
INTRAMUSCULAR | Status: AC
  Filled 2013-07-03: qty 1

## 2013-07-03 MED ORDER — TESTOSTERONE CYPIONATE 200 MG/ML IM SOLN
INTRAMUSCULAR | Status: AC
  Filled 2013-07-03: qty 2

## 2013-07-03 NOTE — Patient Instructions (Signed)
Testosterone injection What is this medicine? TESTOSTERONE (tes TOS ter one) is the main male hormone. It supports normal male development such as muscle growth, facial hair, and deep voice. It is used in males to treat low testosterone levels. This medicine may be used for other purposes; ask your health care provider or pharmacist if you have questions. COMMON BRAND NAME(S): Andro-L.A., Aveed, Delatestryl, Depo-Testosterone, Virilon What should I tell my health care provider before I take this medicine? They need to know if you have any of these conditions: -breast cancer -diabetes -heart disease -kidney disease -liver disease -lung disease -prostate cancer, enlargement -an unusual or allergic reaction to testosterone, other medicines, foods, dyes, or preservatives -pregnant or trying to get pregnant -breast-feeding How should I use this medicine? This medicine is for injection into a muscle. It is usually given by a health care professional in a hospital or clinic setting. Contact your pediatrician regarding the use of this medicine in children. While this medicine may be prescribed for children as young as 32 years of age for selected conditions, precautions do apply. Overdosage: If you think you have taken too much of this medicine contact a poison control center or emergency room at once. NOTE: This medicine is only for you. Do not share this medicine with others. What if I miss a dose? Try not to miss a dose. Your doctor or health care professional will tell you when your next injection is due. Notify the office if you are unable to keep an appointment. What may interact with this medicine? -medicines for diabetes -medicines that treat or prevent blood clots like warfarin -oxyphenbutazone -propranolol -steroid medicines like prednisone or cortisone This list may not describe all possible interactions. Give your health care provider a list of all the medicines, herbs,  non-prescription drugs, or dietary supplements you use. Also tell them if you smoke, drink alcohol, or use illegal drugs. Some items may interact with your medicine. What should I watch for while using this medicine? Visit your doctor or health care professional for regular checks on your progress. They will need to check the level of testosterone in your blood. This medicine may affect blood sugar levels. If you have diabetes, check with your doctor or health care professional before you change your diet or the dose of your diabetic medicine. This drug is banned from use in athletes by most athletic organizations. What side effects may I notice from receiving this medicine? Side effects that you should report to your doctor or health care professional as soon as possible: -allergic reactions like skin rash, itching or hives, swelling of the face, lips, or tongue -breast enlargement -breathing problems -changes in mood, especially anger, depression, or rage -dark urine -general ill feeling or flu-like symptoms -light-colored stools -loss of appetite, nausea -nausea, vomiting -right upper belly pain -stomach pain -swelling of ankles -too frequent or persistent erections -trouble passing urine or change in the amount of urine -unusually weak or tired -yellowing of the eyes or skin Additional side effects that can occur in women include: -deep or hoarse voice -facial hair growth -irregular menstrual periods Side effects that usually do not require medical attention (report to your doctor or health care professional if they continue or are bothersome): -acne -change in sex drive or performance -hair loss -headache This list may not describe all possible side effects. Call your doctor for medical advice about side effects. You may report side effects to FDA at 1-800-FDA-1088. Where should I keep my medicine? Keep  out of the reach of children. This medicine can be abused. Keep your  medicine in a safe place to protect it from theft. Do not share this medicine with anyone. Selling or giving away this medicine is dangerous and against the law. Store at room temperature between 20 and 25 degrees C (68 and 77 degrees F). Do not freeze. Protect from light. Follow the directions for the product you are prescribed. Throw away any unused medicine after the expiration date. NOTE: This sheet is a summary. It may not cover all possible information. If you have questions about this medicine, talk to your doctor, pharmacist, or health care provider.  2014, [employer]. (2007-04-22 16:13:46)

## 2013-07-18 ENCOUNTER — Ambulatory Visit: Payer: Medicaid Other

## 2013-07-31 ENCOUNTER — Ambulatory Visit: Payer: Medicaid Other

## 2013-08-14 ENCOUNTER — Ambulatory Visit (HOSPITAL_BASED_OUTPATIENT_CLINIC_OR_DEPARTMENT_OTHER): Payer: Medicaid Other | Admitting: Hematology & Oncology

## 2013-08-14 ENCOUNTER — Other Ambulatory Visit: Payer: Medicaid Other | Admitting: Lab

## 2013-08-14 ENCOUNTER — Encounter: Payer: Self-pay | Admitting: Hematology & Oncology

## 2013-08-14 ENCOUNTER — Ambulatory Visit (HOSPITAL_BASED_OUTPATIENT_CLINIC_OR_DEPARTMENT_OTHER): Payer: Medicaid Other

## 2013-08-14 VITALS — BP 154/67 | HR 75 | Temp 97.9°F | Resp 18 | Ht 70.0 in | Wt 200.0 lb

## 2013-08-14 DIAGNOSIS — E291 Testicular hypofunction: Secondary | ICD-10-CM

## 2013-08-14 DIAGNOSIS — N039 Chronic nephritic syndrome with unspecified morphologic changes: Secondary | ICD-10-CM

## 2013-08-14 DIAGNOSIS — D631 Anemia in chronic kidney disease: Secondary | ICD-10-CM

## 2013-08-14 DIAGNOSIS — E349 Endocrine disorder, unspecified: Secondary | ICD-10-CM

## 2013-08-14 DIAGNOSIS — C9000 Multiple myeloma not having achieved remission: Secondary | ICD-10-CM

## 2013-08-14 DIAGNOSIS — D509 Iron deficiency anemia, unspecified: Secondary | ICD-10-CM

## 2013-08-14 DIAGNOSIS — N189 Chronic kidney disease, unspecified: Secondary | ICD-10-CM

## 2013-08-14 LAB — CBC WITH DIFFERENTIAL (CANCER CENTER ONLY)
BASO#: 0.1 10*3/uL (ref 0.0–0.2)
BASO%: 0.9 % (ref 0.0–2.0)
EOS%: 3.4 % (ref 0.0–7.0)
Eosinophils Absolute: 0.3 10*3/uL (ref 0.0–0.5)
HCT: 35.5 % — ABNORMAL LOW (ref 38.7–49.9)
HEMOGLOBIN: 11.8 g/dL — AB (ref 13.0–17.1)
LYMPH#: 1.9 10*3/uL (ref 0.9–3.3)
LYMPH%: 26 % (ref 14.0–48.0)
MCH: 25.5 pg — ABNORMAL LOW (ref 28.0–33.4)
MCHC: 33.2 g/dL (ref 32.0–35.9)
MCV: 77 fL — ABNORMAL LOW (ref 82–98)
MONO#: 0.6 10*3/uL (ref 0.1–0.9)
MONO%: 8.5 % (ref 0.0–13.0)
NEUT%: 61.2 % (ref 40.0–80.0)
NEUTROS ABS: 4.6 10*3/uL (ref 1.5–6.5)
PLATELETS: 229 10*3/uL (ref 145–400)
RBC: 4.62 10*6/uL (ref 4.20–5.70)
RDW: 15.7 % (ref 11.1–15.7)
WBC: 7.4 10*3/uL (ref 4.0–10.0)

## 2013-08-14 LAB — CMP (CANCER CENTER ONLY)
ALBUMIN: 3.7 g/dL (ref 3.3–5.5)
ALT(SGPT): 48 U/L — ABNORMAL HIGH (ref 10–47)
AST: 32 U/L (ref 11–38)
Alkaline Phosphatase: 81 U/L (ref 26–84)
BUN: 39 mg/dL — AB (ref 7–22)
CALCIUM: 9.2 mg/dL (ref 8.0–10.3)
CHLORIDE: 97 meq/L — AB (ref 98–108)
CO2: 31 mEq/L (ref 18–33)
CREATININE: 2.3 mg/dL — AB (ref 0.6–1.2)
Glucose, Bld: 179 mg/dL — ABNORMAL HIGH (ref 73–118)
POTASSIUM: 3.5 meq/L (ref 3.3–4.7)
Sodium: 141 mEq/L (ref 128–145)
Total Bilirubin: 0.5 mg/dl (ref 0.20–1.60)
Total Protein: 8.8 g/dL — ABNORMAL HIGH (ref 6.4–8.1)

## 2013-08-14 LAB — IRON AND TIBC CHCC
%SAT: 60 % — ABNORMAL HIGH (ref 20–55)
IRON: 133 ug/dL (ref 42–163)
TIBC: 220 ug/dL (ref 202–409)
UIBC: 87 ug/dL — AB (ref 117–376)

## 2013-08-14 LAB — CHCC SATELLITE - SMEAR

## 2013-08-14 LAB — FERRITIN CHCC: Ferritin: 1564 ng/ml — ABNORMAL HIGH (ref 22–316)

## 2013-08-14 MED ORDER — HEPARIN SOD (PORK) LOCK FLUSH 100 UNIT/ML IV SOLN
500.0000 [IU] | Freq: Once | INTRAVENOUS | Status: AC
  Administered 2013-08-14: 500 [IU] via INTRAVENOUS
  Filled 2013-08-14: qty 5

## 2013-08-14 MED ORDER — TESTOSTERONE CYPIONATE 200 MG/ML IM SOLN
INTRAMUSCULAR | Status: AC
  Filled 2013-08-14: qty 1

## 2013-08-14 MED ORDER — SODIUM CHLORIDE 0.9 % IJ SOLN
10.0000 mL | INTRAMUSCULAR | Status: DC | PRN
  Administered 2013-08-14: 10 mL via INTRAVENOUS
  Filled 2013-08-14: qty 10

## 2013-08-14 MED ORDER — TESTOSTERONE CYPIONATE 200 MG/ML IM SOLN
200.0000 mg | INTRAMUSCULAR | Status: DC
  Administered 2013-08-14: 200 mg via INTRAMUSCULAR

## 2013-08-14 MED ORDER — SODIUM CHLORIDE 0.9 % IV SOLN
60.0000 mg | Freq: Once | INTRAVENOUS | Status: AC
  Administered 2013-08-14: 60 mg via INTRAVENOUS
  Filled 2013-08-14: qty 10

## 2013-08-14 NOTE — Patient Instructions (Signed)
Testosterone injection What is this medicine? TESTOSTERONE (tes TOS ter one) is the main male hormone. It supports normal male development such as muscle growth, facial hair, and deep voice. It is used in males to treat low testosterone levels. This medicine may be used for other purposes; ask your health care provider or pharmacist if you have questions. COMMON BRAND NAME(S): Andro-L.A., Aveed, Delatestryl, Depo-Testosterone, Virilon What should I tell my health care provider before I take this medicine? They need to know if you have any of these conditions: -breast cancer -diabetes -heart disease -kidney disease -liver disease -lung disease -prostate cancer, enlargement -an unusual or allergic reaction to testosterone, other medicines, foods, dyes, or preservatives -pregnant or trying to get pregnant -breast-feeding How should I use this medicine? This medicine is for injection into a muscle. It is usually given by a health care professional in a hospital or clinic setting. Contact your pediatrician regarding the use of this medicine in children. While this medicine may be prescribed for children as young as 55 years of age for selected conditions, precautions do apply. Overdosage: If you think you have taken too much of this medicine contact a poison control center or emergency room at once. NOTE: This medicine is only for you. Do not share this medicine with others. What if I miss a dose? Try not to miss a dose. Your doctor or health care professional will tell you when your next injection is due. Notify the office if you are unable to keep an appointment. What may interact with this medicine? -medicines for diabetes -medicines that treat or prevent blood clots like warfarin -oxyphenbutazone -propranolol -steroid medicines like prednisone or cortisone This list may not describe all possible interactions. Give your health care provider a list of all the medicines, herbs,  non-prescription drugs, or dietary supplements you use. Also tell them if you smoke, drink alcohol, or use illegal drugs. Some items may interact with your medicine. What should I watch for while using this medicine? Visit your doctor or health care professional for regular checks on your progress. They will need to check the level of testosterone in your blood. This medicine may affect blood sugar levels. If you have diabetes, check with your doctor or health care professional before you change your diet or the dose of your diabetic medicine. This drug is banned from use in athletes by most athletic organizations. What side effects may I notice from receiving this medicine? Side effects that you should report to your doctor or health care professional as soon as possible: -allergic reactions like skin rash, itching or hives, swelling of the face, lips, or tongue -breast enlargement -breathing problems -changes in mood, especially anger, depression, or rage -dark urine -general ill feeling or flu-like symptoms -light-colored stools -loss of appetite, nausea -nausea, vomiting -right upper belly pain -stomach pain -swelling of ankles -too frequent or persistent erections -trouble passing urine or change in the amount of urine -unusually weak or tired -yellowing of the eyes or skin Additional side effects that can occur in women include: -deep or hoarse voice -facial hair growth -irregular menstrual periods Side effects that usually do not require medical attention (report to your doctor or health care professional if they continue or are bothersome): -acne -change in sex drive or performance -hair loss -headache This list may not describe all possible side effects. Call your doctor for medical advice about side effects. You may report side effects to FDA at 1-800-FDA-1088. Where should I keep my medicine? Keep  out of the reach of children. This medicine can be abused. Keep your  medicine in a safe place to protect it from theft. Do not share this medicine with anyone. Selling or giving away this medicine is dangerous and against the law. Store at room temperature between 20 and 25 degrees C (68 and 77 degrees F). Do not freeze. Protect from light. Follow the directions for the product you are prescribed. Throw away any unused medicine after the expiration date. NOTE: This sheet is a summary. It may not cover all possible information. If you have questions about this medicine, talk to your doctor, pharmacist, or health care provider.  2015, Elsevier/Gold Standard. (2007-04-22 16:13:46) Pamidronate injection What is this medicine? PAMIDRONATE (pa mi DROE nate) slows calcium loss from bones. It is used to treat high calcium blood levels from cancer or Paget's disease. It is also used to treat bone pain and prevent fractures from certain cancers that have spread to the bone. This medicine may be used for other purposes; ask your health care provider or pharmacist if you have questions. COMMON BRAND NAME(S): Aredia What should I tell my health care provider before I take this medicine? They need to know if you have any of these conditions: -aspirin-sensitive asthma -dental disease -kidney disease -an unusual or allergic reaction to pamidronate, other medicines, foods, dyes, or preservatives -pregnant or trying to get pregnant -breast-feeding How should I use this medicine? This medicine is for infusion into a vein. It is given by a health care professional in a hospital or clinic setting. Talk to your pediatrician regarding the use of this medicine in children. This medicine is not approved for use in children. Overdosage: If you think you have taken too much of this medicine contact a poison control center or emergency room at once. NOTE: This medicine is only for you. Do not share this medicine with others. What if I miss a dose? This does not apply. What may interact  with this medicine? -certain antibiotics given by injection -medicines for inflammation or pain like ibuprofen, naproxen -some diuretics like bumetanide, furosemide -cyclosporine -parathyroid hormone -tacrolimus -teriparatide -thalidomide This list may not describe all possible interactions. Give your health care provider a list of all the medicines, herbs, non-prescription drugs, or dietary supplements you use. Also tell them if you smoke, drink alcohol, or use illegal drugs. Some items may interact with your medicine. What should I watch for while using this medicine? Visit your doctor or health care professional for regular checkups. It may be some time before you see the benefit from this medicine. Do not stop taking your medicine unless your doctor tells you to. Your doctor may order blood tests or other tests to see how you are doing. Women should inform their doctor if they wish to become pregnant or think they might be pregnant. There is a potential for serious side effects to an unborn child. Talk to your health care professional or pharmacist for more information. You should make sure that you get enough calcium and vitamin D while you are taking this medicine. Discuss the foods you eat and the vitamins you take with your health care professional. Some people who take this medicine have severe bone, joint, and/or muscle pain. This medicine may also increase your risk for a broken thigh bone. Tell your doctor right away if you have pain in your upper leg or groin. Tell your doctor if you have any pain that does not go away or that gets  worse. What side effects may I notice from receiving this medicine? Side effects that you should report to your doctor or health care professional as soon as possible: -allergic reactions like skin rash, itching or hives, swelling of the face, lips, or tongue -black or tarry stools -changes in vision -eye inflammation, pain -high blood pressure -jaw  pain, especially burning or cramping -muscle weakness -numb, tingling pain -swelling of feet or hands -trouble passing urine or change in the amount of urine -unable to move easily Side effects that usually do not require medical attention (report to your doctor or health care professional if they continue or are bothersome): -bone, joint, or muscle pain -constipation -dizzy, drowsy -fever -headache -loss of appetite -nausea, vomiting -pain at site where injected This list may not describe all possible side effects. Call your doctor for medical advice about side effects. You may report side effects to FDA at 1-800-FDA-1088. Where should I keep my medicine? This drug is given in a hospital or clinic and will not be stored at home. NOTE: This sheet is a summary. It may not cover all possible information. If you have questions about this medicine, talk to your doctor, pharmacist, or health care provider.  2015, Elsevier/Gold Standard. (2010-08-08 08:49:49)

## 2013-08-15 NOTE — Progress Notes (Signed)
Hematology and Oncology Follow Up Visit  [name] MY:[id number] [birth year] 63 y.o. 08/15/2013   Principle Diagnosis:  1. IgG kappa myeloma. 2. Anemia secondary to renal insufficiency. 3. Intermittent iron-deficiency anemia. 4. Hypotestosteronemia  Current Therapy:   Aredia 60 mg IV q.2 months. 2. Aranesp 300 mcg subcu as needed for hemoglobin less than 11. 3. Depo-Testosterone 300 mg q.2 weeks. 4. IV iron as indicated.     Interim History:  [name] is back for followup. He's doing okay. He still has the arthralgias and myalgias. This is chronic.  We had had no problems with the myeloma. Back in April, his monoclonal spike was her 0.81 g/dL. His IgG level was 1850 mg/dL.  His iron studies done back in April showed a ferritin of 1067. Iron saturation 48%.  He feels that the Aredia does help him. He gets this every 2 months. He was wonder if it is more often. I don't see any problems with this.  He is on testosterone. He gets this every 2 weeks.  His blood sugars have been doing better.    Medications: Current outpatient prescriptions:amLODipine (NORVASC) 10 MG tablet, Take 10 mg by mouth daily.  , Disp: , Rfl: ;  aspirin 81 MG tablet, Take 81 mg by mouth daily., Disp: , Rfl: ;  CARAFATE 1 GM/10ML suspension, Take 1 g by mouth 2 (two) times daily. , Disp: , Rfl: ;  ergocalciferol (VITAMIN D2) 50000 UNITS capsule, Take 50,000 Units by mouth once a week.  , Disp: , Rfl: ;  furosemide (LASIX) 20 MG tablet, Take 20 mg by mouth daily.  , Disp: , Rfl:  glipiZIDE (GLUCOTROL) 5 MG tablet, Take 5 mg by mouth daily. , Disp: , Rfl: ;  hydrALAZINE (APRESOLINE) 25 MG tablet, Take 25 mg by mouth 3 (three) times daily., Disp: , Rfl: ;  Hypromellose (NATURAL BALANCE TEARS OP), Apply to eye every morning., Disp: , Rfl: ;  meclizine (ANTIVERT) 50 MG tablet, Take 0.5 tablets (25 mg total) by mouth 4 (four) times daily as needed for dizziness., Disp: 30 tablet, Rfl: 0 methocarbamol (ROBAXIN) 500 MG  tablet, Take 500 mg by mouth 2 (two) times daily as needed for muscle spasms. , Disp: , Rfl: ;  oxyCODONE (OXYCONTIN) 80 MG 12 hr tablet, Take 80 mg by mouth 3 (three) times daily., Disp: , Rfl: ;  oxyCODONE-acetaminophen (PERCOCET) 10-325 MG per tablet, Take 1 tablet by mouth every 4 (four) hours as needed. Pain, Disp: , Rfl: ;  polyethylene glycol (MIRALAX / GLYCOLAX) packet, Take 17 g by mouth daily., Disp: , Rfl:  potassium chloride SA (K-DUR,KLOR-CON) 20 MEQ tablet, Take 20 mEq by mouth as needed (takes with furosemide). , Disp: , Rfl: ;  Tamsulosin HCl (FLOMAX) 0.4 MG CAPS, Take 0.4 mg by mouth daily. , Disp: , Rfl: ;  triamterene-hydrochlorothiazide (MAXZIDE-25) 37.5-25 MG per tablet, Take 1 tablet by mouth daily.  , Disp: , Rfl: ;  [DISCONTINUED] omeprazole (PRILOSEC) 40 MG capsule, Take 1 capsule (40 mg total) by mouth daily., Disp: 30 capsule, Rfl: 11 [DISCONTINUED] potassium chloride (KLOR-CON) 20 MEQ packet, Take 20 mEq by mouth daily. , Disp: , Rfl:   Allergies:  Allergies  Allergen Reactions  . Iodine Rash    Past Medical History, Surgical history, Social history, and Family History were reviewed and updated.  Review of Systems: As above  Physical Exam:  height is 5\' 10"  (1.778 m) and weight is 200 lb (90.719 kg). His oral temperature is 97.9 F (  36.6 C). His blood pressure is 154/67 and his pulse is 75. His respiration is 18.   Well-developed and well-nourished African American gentleman. Lungs are clear. Cardiac exam regular rate and rhythm. Abdomen soft. Active bowel sounds. There is no fluid wave. There is no palpable liver or spleen tip. Extremities shows no clubbing cyanosis or edema. Skin exam no rashes. Neurological exam is nonfocal.  Lab Results  Component Value Date   WBC 7.4 08/14/2013   HGB 11.8* 08/14/2013   HCT 35.5* 08/14/2013   MCV 77* 08/14/2013   PLT 229 08/14/2013     Chemistry      Component Value Date/Time   NA 141 08/14/2013 0809   NA 137 05/15/2013 1820    K 3.5 08/14/2013 0809   K 4.1 05/15/2013 1820   CL 97* 08/14/2013 0809   CL 97 05/15/2013 1820   CO2 31 08/14/2013 0809   CO2 25 05/15/2013 1820   BUN 39* 08/14/2013 0809   BUN 32* 05/15/2013 1820   CREATININE 2.3* 08/14/2013 0809   CREATININE 2.00* 05/15/2013 1820      Component Value Date/Time   CALCIUM 9.2 08/14/2013 0809   CALCIUM 9.3 05/15/2013 1820   ALKPHOS 81 08/14/2013 0809   ALKPHOS 98 05/15/2013 1820   AST 32 08/14/2013 0809   AST 25 05/15/2013 1820   ALT 48* 08/14/2013 0809   ALT 16 05/15/2013 1820   BILITOT 0.50 08/14/2013 0809   BILITOT 0.3 05/15/2013 1820     Ferritin is 1564. Iron saturation is 60%. Total iron 133. IgG level 2280mg /dl. his testosterone is 130.  Impression and Plan: [name] is a 63 year old gentleman. From my point of view, he is doing well. He has had no problems with myeloma. The Aredia I think is helping him.  I don't think he needs any iron.   I am surprised that his testosterone level is on the low side. We may have to increase the dosing amount.  We will get him back in 6 weeks.  Volanda Napoleon, MD 6/23/20157:11 AM

## 2013-08-16 LAB — PROTEIN ELECTROPHORESIS, SERUM
Albumin ELP: 49 % — ABNORMAL LOW (ref 55.8–66.1)
Alpha-1-Globulin: 3 % (ref 2.9–4.9)
Alpha-2-Globulin: 11.2 % (ref 7.1–11.8)
BETA 2: 6.3 % (ref 3.2–6.5)
Beta Globulin: 5.3 % (ref 4.7–7.2)
Gamma Globulin: 25.2 % — ABNORMAL HIGH (ref 11.1–18.8)
M-SPIKE, %: 0.95 g/dL
TOTAL PROTEIN, SERUM ELECTROPHOR: 8.6 g/dL — AB (ref 6.0–8.3)

## 2013-08-16 LAB — IGG, IGA, IGM
IGG (IMMUNOGLOBIN G), SERUM: 2280 mg/dL — AB (ref 650–1600)
IgA: 473 mg/dL — ABNORMAL HIGH (ref 68–379)
IgM, Serum: 94 mg/dL (ref 41–251)

## 2013-08-16 LAB — RETICULOCYTES (CHCC)
ABS Retic: 18.5 10*3/uL — ABNORMAL LOW (ref 19.0–186.0)
RBC.: 4.63 MIL/uL (ref 4.22–5.81)
Retic Ct Pct: 0.4 % (ref 0.4–2.3)

## 2013-08-16 LAB — KAPPA/LAMBDA LIGHT CHAINS
Kappa free light chain: 6.14 mg/dL — ABNORMAL HIGH (ref 0.33–1.94)
Kappa:Lambda Ratio: 1.69 — ABNORMAL HIGH (ref 0.26–1.65)
LAMBDA FREE LGHT CHN: 3.63 mg/dL — AB (ref 0.57–2.63)

## 2013-08-16 LAB — TESTOSTERONE: Testosterone: 130 ng/dL — ABNORMAL LOW (ref 300–890)

## 2013-08-28 ENCOUNTER — Ambulatory Visit: Payer: Medicaid Other

## 2013-08-28 ENCOUNTER — Other Ambulatory Visit: Payer: Medicaid Other | Admitting: Lab

## 2013-08-30 ENCOUNTER — Encounter: Payer: Self-pay | Admitting: Gastroenterology

## 2013-08-30 ENCOUNTER — Ambulatory Visit (INDEPENDENT_AMBULATORY_CARE_PROVIDER_SITE_OTHER): Payer: Medicaid Other | Admitting: Gastroenterology

## 2013-08-30 VITALS — BP 158/70 | HR 100 | Ht 70.0 in | Wt 201.2 lb

## 2013-08-30 DIAGNOSIS — K5900 Constipation, unspecified: Secondary | ICD-10-CM

## 2013-08-30 DIAGNOSIS — R1013 Epigastric pain: Secondary | ICD-10-CM

## 2013-08-30 DIAGNOSIS — R933 Abnormal findings on diagnostic imaging of other parts of digestive tract: Secondary | ICD-10-CM

## 2013-08-30 MED ORDER — OMEPRAZOLE 20 MG PO CPDR: 20.0000 mg | DELAYED_RELEASE_CAPSULE | Freq: Every day | ORAL | Status: DC

## 2013-08-30 NOTE — Patient Instructions (Addendum)
We have sent the following medications to your pharmacy for you to pick up at your convenience:omeprazole daily.   If your symptoms have not improved while taking omeprazole then you can increase your Carafate up to four times a day.  You have been scheduled for an MRI/MRCP at [facility] on 09/12/13. Your appointment time is 8:00am. Please arrive 15 minutes prior to your appointment time for registration purposes. Please make certain not to have anything to eat or drink 6 hours prior to your test. In addition, if you have any metal in your body, have a pacemaker or defibrillator, please be sure to let your ordering physician know. This test typically takes 45 minutes to 1 hour to complete.  Thank you for choosing me and [facility].  Pricilla Riffle. Dagoberto Ligas., MD., Marval Regal  cc: Gala Romney, MD

## 2013-08-30 NOTE — Progress Notes (Signed)
    History of Present Illness: This is a 63 year old male with chronic, mild epigastric pain and nausea. His symptoms are partially controlled on Carafate twice daily. He has chronic constipation which is partially controlled on MiraLax every other day as needed. He previously underwent endoscopy in January 2010 showing mild gastritis. Colonoscopy performed in may 2013 showed mild diverticulosis and hemorrhoids. Recently abdominal ultrasound imaging on 08/17/2013 [facility] imaging try and which showed mild pancreatic ductal dilatation and a right renal cyst. Pancreatic duct patient was read 3 mm with normal being 2 mm.  Current Medications, Allergies, Past Medical History, Past Surgical History, Family History and Social History were reviewed in [employer] record.  Physical Exam: General: Well developed , well nourished, no acute distress Head: Normocephalic and atraumatic Eyes:  sclerae anicteric, EOMI Ears: Normal auditory acuity Mouth: No deformity or lesions Lungs: Clear throughout to auscultation Heart: Regular rate and rhythm; no murmurs, rubs or bruits Abdomen: Soft, mild diffuse tenderness to deep palpation and non distended. No masses, hepatosplenomegaly or hernias noted. Normal Bowel sounds Musculoskeletal: Symmetrical with no gross deformities  Pulses:  Normal pulses noted Extremities: No clubbing, cyanosis, edema or deformities noted Neurological: Alert oriented x 4, grossly nonfocal Psychological:  Alert and cooperative. Normal mood and affect  Assessment and Recommendations:  1. Abnormal imaging of the pancreatic duct with mild dilation. I suspect this is a mild anatomic variation however we need to exclude mass lesions in other abnormalities. Schedule MRI/MRCP.   2. Chronic, mild epigastric pain. History of gastritis. Begin omeprazole 20 mg daily. If his symptoms are not substantially improved increase Carafate to 4 times a day.  3. Chronic  constipation. Increase MiraLax to once or twice daily every day.

## 2013-09-07 ENCOUNTER — Other Ambulatory Visit: Payer: Self-pay | Admitting: Gastroenterology

## 2013-09-07 DIAGNOSIS — R933 Abnormal findings on diagnostic imaging of other parts of digestive tract: Secondary | ICD-10-CM

## 2013-09-08 ENCOUNTER — Telehealth: Payer: Self-pay | Admitting: Gastroenterology

## 2013-09-08 NOTE — Telephone Encounter (Signed)
I spoke with the patient and provided him the number to scheduling.  He will call back for any additional questions or concerns

## 2013-09-11 ENCOUNTER — Ambulatory Visit (HOSPITAL_BASED_OUTPATIENT_CLINIC_OR_DEPARTMENT_OTHER): Payer: Medicaid Other

## 2013-09-11 ENCOUNTER — Other Ambulatory Visit: Payer: Medicaid Other | Admitting: Lab

## 2013-09-11 VITALS — BP 144/73 | HR 100 | Temp 97.9°F

## 2013-09-11 DIAGNOSIS — E349 Endocrine disorder, unspecified: Secondary | ICD-10-CM

## 2013-09-11 DIAGNOSIS — E291 Testicular hypofunction: Secondary | ICD-10-CM

## 2013-09-11 MED ORDER — TESTOSTERONE CYPIONATE 200 MG/ML IM SOLN
INTRAMUSCULAR | Status: AC
  Filled 2013-09-11: qty 2

## 2013-09-11 MED ORDER — TESTOSTERONE CYPIONATE 200 MG/ML IM SOLN
300.0000 mg | INTRAMUSCULAR | Status: DC
  Administered 2013-09-11: 300 mg via INTRAMUSCULAR

## 2013-09-11 NOTE — Patient Instructions (Signed)
Testosterone This test is used to determine if your testosterone level is abnormal. This could be used to explain difficulty getting an erection (erectile dysfunction), inability of your partner to get pregnant (infertility), premature or delayed puberty if you are male, or the appearance of masculine physical features if you are male. PREPARATION FOR TEST A blood sample is obtained by inserting a needle into a vein in the arm. NORMAL FINDINGS  Free Testosterone: 0.3-2 pg/mL  % Free Testosterone: 0.1%-0.3% Total Testosterone:  7 mos-9 yrs (Tanner Stage I)  Male: Less than 30 ng/dL  Male: Less than 30 ng/dL  10-13 yrs (Tanner Stage II)  Male: Less than 300 ng/dL  Male: Less than 40 ng/dL  14-15 yrs (Tanner Stage III)  Male: 170-540 ng/dL  Male: Less than 60 ng/dL  16-19 yrs (Tanner Stage IV, V)  Male: 250-910 ng/dL  Male: Less than 70 ng/dL  20 yrs and over  Male: [phone] ng/dL  Male: Less than 70 ng/dL Ranges for normal findings may vary among different laboratories and hospitals. You should always check with your doctor after having lab work or other tests done to discuss the meaning of your test results and whether your values are considered within normal limits. MEANING OF TEST  Your caregiver will go over the test results with you and discuss the importance and meaning of your results, as well as treatment options and the need for additional tests if necessary. OBTAINING THE TEST RESULTS It is your responsibility to obtain your test results. Ask the lab or department performing the test when and how you will get your results. Document Released: 02/27/2004 Document Revised: 05/04/2011 Document Reviewed: 01/22/2008 [facility] Patient Information 2015 [place], [place]. This information is not intended to replace advice given to you by your health care provider. Make sure you discuss any questions you have with your health care provider.

## 2013-09-12 ENCOUNTER — Ambulatory Visit (HOSPITAL_COMMUNITY)
Admission: RE | Admit: 2013-09-12 | Discharge: 2013-09-12 | Disposition: A | Discharge status: Home / Self Care | Payer: Medicaid Other | Source: Ambulatory Visit | Attending: Gastroenterology | Admitting: Gastroenterology

## 2013-09-12 ENCOUNTER — Other Ambulatory Visit: Payer: Self-pay | Admitting: Gastroenterology

## 2013-09-12 DIAGNOSIS — N281 Cyst of kidney, acquired: Secondary | ICD-10-CM | POA: Insufficient documentation

## 2013-09-12 DIAGNOSIS — E278 Other specified disorders of adrenal gland: Secondary | ICD-10-CM | POA: Insufficient documentation

## 2013-09-12 DIAGNOSIS — R112 Nausea with vomiting, unspecified: Secondary | ICD-10-CM | POA: Insufficient documentation

## 2013-09-12 DIAGNOSIS — K8689 Other specified diseases of pancreas: Secondary | ICD-10-CM | POA: Insufficient documentation

## 2013-09-12 DIAGNOSIS — J90 Pleural effusion, not elsewhere classified: Secondary | ICD-10-CM | POA: Diagnosis not present

## 2013-09-12 DIAGNOSIS — R933 Abnormal findings on diagnostic imaging of other parts of digestive tract: Secondary | ICD-10-CM

## 2013-09-25 ENCOUNTER — Ambulatory Visit (HOSPITAL_BASED_OUTPATIENT_CLINIC_OR_DEPARTMENT_OTHER): Payer: Medicaid Other | Admitting: Hematology & Oncology

## 2013-09-25 ENCOUNTER — Encounter: Payer: Self-pay | Admitting: Hematology & Oncology

## 2013-09-25 ENCOUNTER — Ambulatory Visit (HOSPITAL_BASED_OUTPATIENT_CLINIC_OR_DEPARTMENT_OTHER): Payer: Medicaid Other | Admitting: Lab

## 2013-09-25 ENCOUNTER — Ambulatory Visit (HOSPITAL_BASED_OUTPATIENT_CLINIC_OR_DEPARTMENT_OTHER): Payer: Medicaid Other

## 2013-09-25 VITALS — BP 149/69 | HR 96 | Temp 98.0°F | Resp 18 | Ht 70.0 in | Wt 201.0 lb

## 2013-09-25 DIAGNOSIS — N189 Chronic kidney disease, unspecified: Secondary | ICD-10-CM

## 2013-09-25 DIAGNOSIS — D631 Anemia in chronic kidney disease: Secondary | ICD-10-CM

## 2013-09-25 DIAGNOSIS — R110 Nausea: Secondary | ICD-10-CM

## 2013-09-25 DIAGNOSIS — C9000 Multiple myeloma not having achieved remission: Secondary | ICD-10-CM

## 2013-09-25 DIAGNOSIS — D509 Iron deficiency anemia, unspecified: Secondary | ICD-10-CM

## 2013-09-25 DIAGNOSIS — E349 Endocrine disorder, unspecified: Secondary | ICD-10-CM

## 2013-09-25 LAB — CBC WITH DIFFERENTIAL (CANCER CENTER ONLY)
BASO#: 0 10*3/uL (ref 0.0–0.2)
BASO%: 0.3 % (ref 0.0–2.0)
EOS%: 1.2 % (ref 0.0–7.0)
Eosinophils Absolute: 0.2 10*3/uL (ref 0.0–0.5)
HEMATOCRIT: 33.9 % — AB (ref 38.7–49.9)
HGB: 11.4 g/dL — ABNORMAL LOW (ref 13.0–17.1)
LYMPH#: 1.4 10*3/uL (ref 0.9–3.3)
LYMPH%: 10.9 % — ABNORMAL LOW (ref 14.0–48.0)
MCH: 25.9 pg — ABNORMAL LOW (ref 28.0–33.4)
MCHC: 33.6 g/dL (ref 32.0–35.9)
MCV: 77 fL — ABNORMAL LOW (ref 82–98)
MONO#: 1 10*3/uL — AB (ref 0.1–0.9)
MONO%: 8.1 % (ref 0.0–13.0)
NEUT#: 9.9 10*3/uL — ABNORMAL HIGH (ref 1.5–6.5)
NEUT%: 79.5 % (ref 40.0–80.0)
Platelets: 228 10*3/uL (ref 145–400)
RBC: 4.41 10*6/uL (ref 4.20–5.70)
RDW: 15.9 % — AB (ref 11.1–15.7)
WBC: 12.4 10*3/uL — ABNORMAL HIGH (ref 4.0–10.0)

## 2013-09-25 LAB — CHCC SATELLITE - SMEAR

## 2013-09-25 LAB — IRON AND TIBC CHCC
%SAT: 14 % — AB (ref 20–55)
Iron: 29 ug/dL — ABNORMAL LOW (ref 42–163)
TIBC: 208 ug/dL (ref 202–409)
UIBC: 179 ug/dL (ref 117–376)

## 2013-09-25 LAB — FERRITIN CHCC: FERRITIN: 973 ng/mL — AB (ref 22–316)

## 2013-09-25 MED ORDER — SODIUM CHLORIDE 0.9 % IV SOLN
60.0000 mg | Freq: Once | INTRAVENOUS | Status: AC
  Administered 2013-09-25: 60 mg via INTRAVENOUS
  Filled 2013-09-25: qty 6.67

## 2013-09-25 MED ORDER — TESTOSTERONE CYPIONATE 200 MG/ML IM SOLN: 300.0000 mg | INTRAMUSCULAR | Status: DC

## 2013-09-25 MED ORDER — DARBEPOETIN ALFA-POLYSORBATE 300 MCG/0.6ML IJ SOLN: 300.0000 ug | Freq: Once | INTRAMUSCULAR | Status: DC

## 2013-09-25 NOTE — Progress Notes (Signed)
Hematology and Oncology Follow Up Visit  [name] [id number] 1950-07-29 63 y.o. 09/25/2013   Principle Diagnosis:  1. IgG kappa myeloma. 2. Anemia secondary to renal insufficiency. 3. Intermittent iron-deficiency anemia. 4. Hypotestosteronemia  Current Therapy:   1. Aredia 60 mg IV q.2 months. 2. Aranesp 300 mcg subcu as needed for hemoglobin less than      11. 3. Depo-Testosterone 300 mg q.2 weeks. 4. IV iron as indicated.     Interim History:  [name] is back for followup. Unfortunately, since her last saw him, he did have an abdominal issues. He went to the emergency room. He had an MRI of the abdomen done. This was done in mid July. This showed pretty much been unremarkable abdomen. He has some hemosiderosis. His stomach looked okay. There is no lymphadenopathy. No comment was made on the bones.  He had a 3-D reconstruction done. This showed a pancreatic ductal dilation with pancreas divisum noted. Thereafter he says that whenever he eats, he has to go to the bathroom. 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 Also says that when he eats, sometimes it feels as if food gets stuck. I know that he's had upper GI done which does not show anything unusual.  His last myeloma studies back in June did show his M spike to be 0.95 g/dL. His IgG level was up to 2280 mg/dL. His kappa light chain was 6.14 mg/dL.  Back in June, his ferritin was 1500 with an iron saturation of 60.  Medications: Current outpatient prescriptions:amLODipine (NORVASC) 10 MG tablet, Take 10 mg by mouth daily.  , Disp: , Rfl: ;  aspirin 81 MG tablet, Take 81 mg by mouth daily., Disp: , Rfl: ;  CARAFATE 1 GM/10ML suspension, Take 1 g by mouth 2 (two) times daily. , Disp: , Rfl: ;  ergocalciferol (VITAMIN D2) 50000 UNITS capsule, Take 50,000 Units by mouth once a week.  , Disp: , Rfl: ;  furosemide (LASIX) 20 MG tablet, Take 20 mg by mouth daily.  , Disp: , Rfl:  glipiZIDE (GLUCOTROL) 5 MG tablet, Take 5 mg by mouth daily. ,  Disp: , Rfl: ;  hydrALAZINE (APRESOLINE) 25 MG tablet, Take 25 mg by mouth 3 (three) times daily., Disp: , Rfl: ;  Hypromellose (NATURAL BALANCE TEARS OP), Apply to eye every morning., Disp: , Rfl: ;  meclizine (ANTIVERT) 50 MG tablet, Take 0.5 tablets (25 mg total) by mouth 4 (four) times daily as needed for dizziness., Disp: 30 tablet, Rfl: 0 methocarbamol (ROBAXIN) 500 MG tablet, Take 500 mg by mouth 2 (two) times daily as needed for muscle spasms. , Disp: , Rfl: ;  omeprazole (PRILOSEC) 20 MG capsule, Take 1 capsule (20 mg total) by mouth daily., Disp: 30 capsule, Rfl: 11;  oxyCODONE (OXYCONTIN) 80 MG 12 hr tablet, Take 80 mg by mouth 3 (three) times daily., Disp: , Rfl:  oxyCODONE-acetaminophen (PERCOCET) 10-325 MG per tablet, Take 1 tablet by mouth every 4 (four) hours as needed. Pain, Disp: , Rfl: ;  polyethylene glycol (MIRALAX / GLYCOLAX) packet, Take 17 g by mouth daily., Disp: , Rfl: ;  potassium chloride SA (K-DUR,KLOR-CON) 20 MEQ tablet, Take 20 mEq by mouth as needed (takes with furosemide). , Disp: , Rfl: ;  Tamsulosin HCl (FLOMAX) 0.4 MG CAPS, Take 0.4 mg by mouth daily. , Disp: , Rfl:  triamterene-hydrochlorothiazide (MAXZIDE-25) 37.5-25 MG per tablet, Take 1 tablet by mouth daily.  , Disp: , Rfl: ;  [DISCONTINUED] potassium chloride (KLOR-CON) 20 MEQ packet,  Take 20 mEq by mouth daily. , Disp: , Rfl:  No current facility-administered medications for this visit. Facility-Administered Medications Ordered in Other Visits: testosterone cypionate (DEPOTESTOTERONE CYPIONATE) injection 300 mg, 300 mg, Intramuscular, Q14 Days, Volanda Napoleon, MD  Allergies:  Allergies  Allergen Reactions  . Iodine Rash    Past Medical History, Surgical history, Social history, and Family History were reviewed and updated.  Review of Systems: As above  Physical Exam:  height is 5\' 10"  (1.778 m) and weight is 201 lb (91.173 kg). His oral temperature is 98 F (36.7 C). His blood pressure is 149/69 and his  pulse is 96. His respiration is 18.   Well-developed and well-nourished African American gentleman in no obvious distress. Head and neck exam shows no ocular or oral lesions. He is no palpable cervical or supraclavicular lymph nodes. Lungs are clear. Cardiac exam is regular in rhythm with no murmurs rubs or bruits. Abdomen is soft. Has good bowel sounds. There is no fluid wave. Bowel sounds I see might be slightly decreased. There is no palpable liver or spleen tip. Back exam no tenderness over the spine ribs or hips. Extremities shows no clubbing cyanosis or edema. Skin exam no rashes. Neurological exam is nonfocal.      Lab Results  Component Value Date   WBC 12.4* 09/25/2013   HGB 11.4* 09/25/2013   HCT 33.9* 09/25/2013   MCV 77* 09/25/2013   PLT 228 09/25/2013     Chemistry      Component Value Date/Time   NA 134* 09/25/2013 0802   NA 141 08/14/2013 0809   K 3.6 09/25/2013 0802   K 3.5 08/14/2013 0809   CL 94* 09/25/2013 0802   CL 97* 08/14/2013 0809   CO2 26 09/25/2013 0802   CO2 31 08/14/2013 0809   BUN 37* 09/25/2013 0802   BUN 39* 08/14/2013 0809   CREATININE 2.44* 09/25/2013 0802   CREATININE 2.3* 08/14/2013 0809      Component Value Date/Time   CALCIUM 9.1 09/25/2013 0802   CALCIUM 9.2 08/14/2013 0809   ALKPHOS 69 09/25/2013 0802   ALKPHOS 81 08/14/2013 0809   AST 16 09/25/2013 0802   AST 32 08/14/2013 0809   ALT 16 09/25/2013 0802   ALT 48* 08/14/2013 0809   BILITOT 0.4 09/25/2013 0802   BILITOT 0.50 08/14/2013 0809      Ferritin is 973. Iron saturation is only 14%. Total iron is 29. LDH is 169.   Impression and Plan: [name] is 63 year old gentleman. He has a history of IgG kappa myeloma. We have not had to treat him for several years.  I've noted that his myeloma studies are slowly going up. We will see what the levels are today. If they continue to go up, we may have to restage him. This probably would be with a bone marrow and a PET scan.  His iron studies are back down again. We will  have to give him some IV iron.  I'll see about a gastric emptying scan. Again he may have some gastroparesis from his diabetes.  [name] and has a lot of health issues. It is very difficult to know what exactly is going on with him.  I want him back in a month.  I spent about 30 minutes with him this morning. Volanda Napoleon, MD 8/3/20157:01 PM

## 2013-09-25 NOTE — Patient Instructions (Signed)

## 2013-09-27 LAB — IGG, IGA, IGM
IGG (IMMUNOGLOBIN G), SERUM: 1720 mg/dL — AB (ref 650–1600)
IgA: 406 mg/dL — ABNORMAL HIGH (ref 68–379)
IgM, Serum: 73 mg/dL (ref 41–251)

## 2013-09-27 LAB — KAPPA/LAMBDA LIGHT CHAINS
KAPPA FREE LGHT CHN: 5.73 mg/dL — AB (ref 0.33–1.94)
Kappa:Lambda Ratio: 1.54 (ref 0.26–1.65)
Lambda Free Lght Chn: 3.72 mg/dL — ABNORMAL HIGH (ref 0.57–2.63)

## 2013-09-27 LAB — COMPREHENSIVE METABOLIC PANEL
ALBUMIN: 4.1 g/dL (ref 3.5–5.2)
ALT: 16 U/L (ref 0–53)
AST: 16 U/L (ref 0–37)
Alkaline Phosphatase: 69 U/L (ref 39–117)
BUN: 37 mg/dL — ABNORMAL HIGH (ref 6–23)
CALCIUM: 9.1 mg/dL (ref 8.4–10.5)
CHLORIDE: 94 meq/L — AB (ref 96–112)
CO2: 26 meq/L (ref 19–32)
CREATININE: 2.44 mg/dL — AB (ref 0.50–1.35)
Glucose, Bld: 229 mg/dL — ABNORMAL HIGH (ref 70–99)
POTASSIUM: 3.6 meq/L (ref 3.5–5.3)
Sodium: 134 mEq/L — ABNORMAL LOW (ref 135–145)
Total Bilirubin: 0.4 mg/dL (ref 0.2–1.2)
Total Protein: 7.8 g/dL (ref 6.0–8.3)

## 2013-09-27 LAB — PROTEIN ELECTROPHORESIS, SERUM, WITH REFLEX
ALBUMIN ELP: 47.5 % — AB (ref 55.8–66.1)
ALPHA-1-GLOBULIN: 4.6 % (ref 2.9–4.9)
ALPHA-2-GLOBULIN: 14.3 % — AB (ref 7.1–11.8)
BETA GLOBULIN: 5.2 % (ref 4.7–7.2)
Beta 2: 6.7 % — ABNORMAL HIGH (ref 3.2–6.5)
Gamma Globulin: 21.7 % — ABNORMAL HIGH (ref 11.1–18.8)
M-SPIKE, %: 0.66 g/dL
TOTAL PROTEIN, SERUM ELECTROPHOR: 7.8 g/dL (ref 6.0–8.3)

## 2013-09-27 LAB — RETICULOCYTES (CHCC)
ABS Retic: 48.8 10*3/uL (ref 19.0–186.0)
RBC.: 4.44 MIL/uL (ref 4.22–5.81)
RETIC CT PCT: 1.1 % (ref 0.4–2.3)

## 2013-09-27 LAB — LACTATE DEHYDROGENASE: LDH: 169 U/L (ref 94–250)

## 2013-09-27 LAB — IFE INTERPRETATION

## 2013-09-28 ENCOUNTER — Telehealth: Payer: Self-pay | Admitting: *Deleted

## 2013-09-28 NOTE — Telephone Encounter (Addendum)
Message copied by Orlando Penner on Thu Sep 28, 2013 12:06 PM ------      Message from: Burney Gauze R      Created: Wed Sep 27, 2013  5:58 PM       Call - myeloma level is actually better!!!!  Laurey Arrow ------This message given to pt.  Voiced understanding.

## 2013-09-29 ENCOUNTER — Ambulatory Visit (HOSPITAL_BASED_OUTPATIENT_CLINIC_OR_DEPARTMENT_OTHER): Payer: Medicaid Other

## 2013-09-29 VITALS — BP 164/79 | HR 96 | Temp 98.9°F | Resp 20

## 2013-09-29 DIAGNOSIS — D509 Iron deficiency anemia, unspecified: Secondary | ICD-10-CM

## 2013-09-29 DIAGNOSIS — E349 Endocrine disorder, unspecified: Secondary | ICD-10-CM

## 2013-09-29 DIAGNOSIS — E291 Testicular hypofunction: Secondary | ICD-10-CM

## 2013-09-29 MED ORDER — TESTOSTERONE CYPIONATE 200 MG/ML IM SOLN
INTRAMUSCULAR | Status: AC
  Filled 2013-09-29: qty 2

## 2013-09-29 MED ORDER — SODIUM CHLORIDE 0.9 % IJ SOLN
10.0000 mL | INTRAMUSCULAR | Status: DC | PRN
  Administered 2013-09-29: 10 mL via INTRAVENOUS
  Filled 2013-09-29: qty 10

## 2013-09-29 MED ORDER — HEPARIN SOD (PORK) LOCK FLUSH 100 UNIT/ML IV SOLN
500.0000 [IU] | Freq: Once | INTRAVENOUS | Status: AC
  Administered 2013-09-29: 500 [IU] via INTRAVENOUS
  Filled 2013-09-29: qty 5

## 2013-09-29 MED ORDER — TESTOSTERONE CYPIONATE 200 MG/ML IM SOLN
300.0000 mg | INTRAMUSCULAR | Status: DC
  Administered 2013-09-29: 300 mg via INTRAMUSCULAR

## 2013-09-29 MED ORDER — SODIUM CHLORIDE 0.9 % IV SOLN
1020.0000 mg | Freq: Once | INTRAVENOUS | Status: AC
  Administered 2013-09-29: 1020 mg via INTRAVENOUS
  Filled 2013-09-29: qty 34

## 2013-09-29 MED ORDER — SODIUM CHLORIDE 0.9 % IV SOLN
Freq: Once | INTRAVENOUS | Status: AC
  Administered 2013-09-29: 10:00:00 via INTRAVENOUS

## 2013-09-29 NOTE — Patient Instructions (Signed)
Ferumoxytol injection What is this medicine? FERUMOXYTOL is an iron complex. Iron is used to make healthy red blood cells, which carry oxygen and nutrients throughout the body. This medicine is used to treat iron deficiency anemia in people with chronic kidney disease. This medicine may be used for other purposes; ask your health care provider or pharmacist if you have questions. COMMON BRAND NAME(S): Feraheme What should I tell my health care provider before I take this medicine? They need to know if you have any of these conditions: -anemia not caused by low iron levels -high levels of iron in the blood -magnetic resonance imaging (MRI) test scheduled -an unusual or allergic reaction to iron, other medicines, foods, dyes, or preservatives -pregnant or trying to get pregnant -breast-feeding How should I use this medicine? This medicine is for injection into a vein. It is given by a health care professional in a hospital or clinic setting. Talk to your pediatrician regarding the use of this medicine in children. Special care may be needed. Overdosage: If you think you've taken too much of this medicine contact a poison control center or emergency room at once. Overdosage: If you think you have taken too much of this medicine contact a poison control center or emergency room at once. NOTE: This medicine is only for you. Do not share this medicine with others. What if I miss a dose? It is important not to miss your dose. Call your doctor or health care professional if you are unable to keep an appointment. What may interact with this medicine? This medicine may interact with the following medications: -other iron products This list may not describe all possible interactions. Give your health care provider a list of all the medicines, herbs, non-prescription drugs, or dietary supplements you use. Also tell them if you smoke, drink alcohol, or use illegal drugs. Some items may interact with your  medicine. What should I watch for while using this medicine? Visit your doctor or healthcare professional regularly. Tell your doctor or healthcare professional if your symptoms do not start to get better or if they get worse. You may need blood work done while you are taking this medicine. You may need to follow a special diet. Talk to your doctor. Foods that contain iron include: whole grains/cereals, dried fruits, beans, or peas, leafy green vegetables, and organ meats (liver, kidney). What side effects may I notice from receiving this medicine? Side effects that you should report to your doctor or health care professional as soon as possible: -allergic reactions like skin rash, itching or hives, swelling of the face, lips, or tongue -breathing problems -changes in blood pressure -feeling faint or lightheaded, falls -fever or chills -flushing, sweating, or hot feelings -swelling of the ankles or feet Side effects that usually do not require medical attention (Report these to your doctor or health care professional if they continue or are bothersome.): -diarrhea -headache -nausea, vomiting -stomach pain This list may not describe all possible side effects. Call your doctor for medical advice about side effects. You may report side effects to FDA at 1-800-FDA-1088. Where should I keep my medicine? This drug is given in a hospital or clinic and will not be stored at home. NOTE: This sheet is a summary. It may not cover all possible information. If you have questions about this medicine, talk to your doctor, pharmacist, or health care provider.  2015, Elsevier/Gold Standard. (2011-09-25 15:23:36) Testosterone injection What is this medicine? TESTOSTERONE (tes TOS ter one) is the main   male hormone. It supports normal male development such as muscle growth, facial hair, and deep voice. It is used in males to treat low testosterone levels. This medicine may be used for other purposes; ask your  health care provider or pharmacist if you have questions. COMMON BRAND NAME(S): Andro-L.A., Aveed, Delatestryl, Depo-Testosterone, Virilon What should I tell my health care provider before I take this medicine? They need to know if you have any of these conditions: -breast cancer -diabetes -heart disease -kidney disease -liver disease -lung disease -prostate cancer, enlargement -an unusual or allergic reaction to testosterone, other medicines, foods, dyes, or preservatives -pregnant or trying to get pregnant -breast-feeding How should I use this medicine? This medicine is for injection into a muscle. It is usually given by a health care professional in a hospital or clinic setting. Contact your pediatrician regarding the use of this medicine in children. While this medicine may be prescribed for children as young as 12 years of age for selected conditions, precautions do apply. Overdosage: If you think you have taken too much of this medicine contact a poison control center or emergency room at once. NOTE: This medicine is only for you. Do not share this medicine with others. What if I miss a dose? Try not to miss a dose. Your doctor or health care professional will tell you when your next injection is due. Notify the office if you are unable to keep an appointment. What may interact with this medicine? -medicines for diabetes -medicines that treat or prevent blood clots like warfarin -oxyphenbutazone -propranolol -steroid medicines like prednisone or cortisone This list may not describe all possible interactions. Give your health care provider a list of all the medicines, herbs, non-prescription drugs, or dietary supplements you use. Also tell them if you smoke, drink alcohol, or use illegal drugs. Some items may interact with your medicine. What should I watch for while using this medicine? Visit your doctor or health care professional for regular checks on your progress. They will  need to check the level of testosterone in your blood. This medicine is only approved for use in men who have low levels of testosterone related to certain medical conditions. Heart attacks and strokes have been reported with the use of this medicine. Notify your doctor or health care professional and seek emergency treatment if you develop breathing problems; changes in vision; confusion; chest pain or chest tightness; sudden arm pain; severe, sudden headache; trouble speaking or understanding; sudden numbness or weakness of the face, arm or leg; loss of balance or coordination. Talk to your doctor about the risks and benefits of this medicine. This medicine may affect blood sugar levels. If you have diabetes, check with your doctor or health care professional before you change your diet or the dose of your diabetic medicine. This drug is banned from use in athletes by most athletic organizations. What side effects may I notice from receiving this medicine? Side effects that you should report to your doctor or health care professional as soon as possible: -allergic reactions like skin rash, itching or hives, swelling of the face, lips, or tongue -breast enlargement -breathing problems -changes in mood, especially anger, depression, or rage -dark urine -general ill feeling or flu-like symptoms -light-colored stools -loss of appetite, nausea -nausea, vomiting -right upper belly pain -stomach pain -swelling of ankles -too frequent or persistent erections -trouble passing urine or change in the amount of urine -unusually weak or tired -yellowing of the eyes or skin Additional side effects that   can occur in women include: -deep or hoarse voice -facial hair growth -irregular menstrual periods Side effects that usually do not require medical attention (report to your doctor or health care professional if they continue or are bothersome): -acne -change in sex drive or performance -hair  loss -headache This list may not describe all possible side effects. Call your doctor for medical advice about side effects. You may report side effects to FDA at 1-800-FDA-1088. Where should I keep my medicine? Keep out of the reach of children. This medicine can be abused. Keep your medicine in a safe place to protect it from theft. Do not share this medicine with anyone. Selling or giving away this medicine is dangerous and against the law. Store at room temperature between 20 and 25 degrees C (68 and 77 degrees F). Do not freeze. Protect from light. Follow the directions for the product you are prescribed. Throw away any unused medicine after the expiration date. NOTE: This sheet is a summary. It may not cover all possible information. If you have questions about this medicine, talk to your doctor, pharmacist, or health care provider.  2015, Elsevier/Gold Standard. (2013-04-27 08:27:24)  

## 2013-10-02 ENCOUNTER — Encounter: Payer: Self-pay | Admitting: *Deleted

## 2013-10-02 ENCOUNTER — Emergency Department (HOSPITAL_COMMUNITY)
Admission: EM | Admit: 2013-10-02 | Discharge: 2013-10-02 | Disposition: A | Discharge status: Home / Self Care | Payer: Medicaid Other | Attending: Emergency Medicine | Admitting: Emergency Medicine

## 2013-10-02 ENCOUNTER — Emergency Department (HOSPITAL_COMMUNITY): Payer: Medicaid Other

## 2013-10-02 ENCOUNTER — Encounter (HOSPITAL_COMMUNITY): Payer: Self-pay | Admitting: Emergency Medicine

## 2013-10-02 DIAGNOSIS — I10 Essential (primary) hypertension: Secondary | ICD-10-CM | POA: Diagnosis not present

## 2013-10-02 DIAGNOSIS — Z79899 Other long term (current) drug therapy: Secondary | ICD-10-CM | POA: Insufficient documentation

## 2013-10-02 DIAGNOSIS — E119 Type 2 diabetes mellitus without complications: Secondary | ICD-10-CM | POA: Insufficient documentation

## 2013-10-02 DIAGNOSIS — M545 Low back pain, unspecified: Secondary | ICD-10-CM | POA: Diagnosis not present

## 2013-10-02 DIAGNOSIS — C9000 Multiple myeloma not having achieved remission: Secondary | ICD-10-CM | POA: Diagnosis not present

## 2013-10-02 DIAGNOSIS — M25559 Pain in unspecified hip: Secondary | ICD-10-CM | POA: Insufficient documentation

## 2013-10-02 HISTORY — DX: Multiple myeloma not having achieved remission: C90.00

## 2013-10-02 HISTORY — DX: Type 2 diabetes mellitus without complications: E11.9

## 2013-10-02 LAB — BASIC METABOLIC PANEL
Anion gap: 12 (ref 5–15)
BUN: 33 mg/dL — ABNORMAL HIGH (ref 6–23)
CHLORIDE: 97 meq/L (ref 96–112)
CO2: 27 mEq/L (ref 19–32)
Calcium: 9.7 mg/dL (ref 8.4–10.5)
Creatinine, Ser: 2.24 mg/dL — ABNORMAL HIGH (ref 0.50–1.35)
GFR, EST AFRICAN AMERICAN: 34 mL/min — AB (ref >90)
GFR, EST NON AFRICAN AMERICAN: 29 mL/min — AB (ref >90)
Glucose, Bld: 116 mg/dL — ABNORMAL HIGH (ref 70–99)
Potassium: 3.7 mEq/L (ref 3.7–5.3)
SODIUM: 136 meq/L — AB (ref 137–147)

## 2013-10-02 LAB — CBC WITH DIFFERENTIAL/PLATELET
BASOS PCT: 0 % (ref 0–1)
Basophils Absolute: 0 10*3/uL (ref 0.0–0.1)
EOS ABS: 0.1 10*3/uL (ref 0.0–0.7)
Eosinophils Relative: 1 % (ref 0–5)
HCT: 33.8 % — ABNORMAL LOW (ref 39.0–52.0)
HEMOGLOBIN: 11.2 g/dL — AB (ref 13.0–17.0)
Lymphocytes Relative: 18 % (ref 12–46)
Lymphs Abs: 2.4 10*3/uL (ref 0.7–4.0)
MCH: 25.4 pg — ABNORMAL LOW (ref 26.0–34.0)
MCHC: 33.1 g/dL (ref 30.0–36.0)
MCV: 76.6 fL — ABNORMAL LOW (ref 78.0–100.0)
Monocytes Absolute: 1.1 10*3/uL — ABNORMAL HIGH (ref 0.1–1.0)
Monocytes Relative: 8 % (ref 3–12)
NEUTROS ABS: 9.9 10*3/uL — AB (ref 1.7–7.7)
NEUTROS PCT: 73 % (ref 43–77)
Platelets: 238 10*3/uL (ref 150–400)
RBC: 4.41 MIL/uL (ref 4.22–5.81)
RDW: 15.7 % — ABNORMAL HIGH (ref 11.5–15.5)
WBC: 13.6 10*3/uL — ABNORMAL HIGH (ref 4.0–10.5)

## 2013-10-02 MED ORDER — HYDROMORPHONE HCL PF 1 MG/ML IJ SOLN
2.0000 mg | Freq: Once | INTRAMUSCULAR | Status: AC
  Administered 2013-10-02: 2 mg via INTRAVENOUS
  Filled 2013-10-02: qty 2

## 2013-10-02 MED ORDER — LORAZEPAM 2 MG/ML IJ SOLN
1.0000 mg | Freq: Once | INTRAMUSCULAR | Status: AC
  Administered 2013-10-02: 1 mg via INTRAVENOUS
  Filled 2013-10-02: qty 1

## 2013-10-02 MED ORDER — SODIUM CHLORIDE 0.9 % IV SOLN
INTRAVENOUS | Status: DC
  Administered 2013-10-02: 12:00:00 via INTRAVENOUS

## 2013-10-02 MED ORDER — HEPARIN SOD (PORK) LOCK FLUSH 100 UNIT/ML IV SOLN
500.0000 [IU] | Freq: Once | INTRAVENOUS | Status: AC
  Administered 2013-10-02: 500 [IU]
  Filled 2013-10-02: qty 5

## 2013-10-02 MED ORDER — DIAZEPAM 5 MG PO TABS: 5.0000 mg | ORAL_TABLET | ORAL | Status: DC | PRN

## 2013-10-02 MED ORDER — HYDROMORPHONE HCL PF 1 MG/ML IJ SOLN
1.0000 mg | Freq: Once | INTRAMUSCULAR | Status: AC
  Administered 2013-10-02: 1 mg via INTRAVENOUS
  Filled 2013-10-02: qty 1

## 2013-10-02 MED ORDER — SODIUM CHLORIDE 0.9 % IJ SOLN
INTRAMUSCULAR | Status: AC
  Administered 2013-10-02: 10 mL
  Filled 2013-10-02: qty 10

## 2013-10-02 NOTE — ED Notes (Signed)
Pt states he has being having right side hip pain and lower back pain. Pain does not radiate. Pt does have history of multiple myeloma and is receiving chemo treatment.

## 2013-10-02 NOTE — Progress Notes (Signed)
Pt called to report he is having pain in his back and down his right side.  Is able to move right side extremities.  States he is unable to walk.  Instruted him to call 911 and go to hospital.  Dr. [name] notified and pt states he will go.

## 2013-10-02 NOTE — Discharge Instructions (Signed)

## 2013-10-02 NOTE — ED Provider Notes (Signed)
CSN: [id number]     Arrival date & time 10/02/13  1027 History   First MD Initiated Contact with Patient 10/02/13 1107     Chief Complaint  Patient presents with  . Hip Pain  . Back Pain     (Consider location/radiation/quality/duration/timing/severity/associated sxs/prior Treatment) Patient is a 63 y.o. male presenting with hip pain and back pain. The history is provided by the patient.  Hip Pain  Back Pain  patient here complaining of worsening of right-sided back pain x3 days. Has a history of multiple myeloma and is currently receiving chemotherapy. Denies any urinary retention or bowel incontinence. Denies any saddle anesthesias. States that he has not dragging his foot when he walks. Denies any history of trauma. Pain characterized as sharp and worse with certain positions. Denies any rashes to the area. Symptoms are persistent and not relieved with his home medications. Called his doctor and told to come here for further evaluation. Last chemotherapy treatment was last week.  Past Medical History  Diagnosis Date  . Multiple myeloma   . Hypertension   . Diabetes mellitus without complication    Past Surgical History  Procedure Laterality Date  . Knee surgery     History reviewed. No pertinent family history. History  Substance Use Topics  . Smoking status: Never Smoker   . Smokeless tobacco: Not on file  . Alcohol Use: No    Review of Systems  Musculoskeletal: Positive for back pain.  All other systems reviewed and are negative.     Allergies  Iodine  Home Medications   Prior to Admission medications   Medication Sig Start Date End Date Taking? Authorizing Provider  amLODipine (NORVASC) 10 MG tablet Take 10 mg by mouth daily.   Yes Historical Provider, MD  glipiZIDE (GLUCOTROL) 5 MG tablet Take 5 mg by mouth daily before breakfast.   Yes Historical Provider, MD  methocarbamol (ROBAXIN) 500 MG tablet Take 500 mg by mouth daily.   Yes Historical Provider, MD    omeprazole (PRILOSEC) 20 MG capsule Take 20 mg by mouth daily.   Yes Historical Provider, MD  OxyCODONE (OXYCONTIN) 80 mg T12A 12 hr tablet Take 80 mg by mouth 3 (three) times daily.   Yes Historical Provider, MD  oxyCODONE-acetaminophen (PERCOCET) 10-325 MG per tablet Take 1 tablet by mouth every 6 (six) hours as needed for pain.   Yes Historical Provider, MD  Polyvinyl Alcohol-Povidone (CLEAR EYES ALL SEASONS) 5-6 MG/ML SOLN Apply 2 drops to eye daily as needed (drye eyes.).   Yes Historical Provider, MD  sucralfate (CARAFATE) 1 GM/10ML suspension Take 2 g by mouth 4 (four) times daily.   Yes Historical Provider, MD  tamsulosin (FLOMAX) 0.4 MG CAPS capsule Take 0.4 mg by mouth daily.   Yes Historical Provider, MD  triamterene-hydrochlorothiazide (DYAZIDE) 37.5-25 MG per capsule Take 1 capsule by mouth daily.   Yes Historical Provider, MD  Vitamin D, Ergocalciferol, (DRISDOL) 50000 UNITS CAPS capsule Take 50,000 Units by mouth every 7 (seven) days.   Yes Historical Provider, MD   BP 151/82  Pulse 96  Temp(Src) 98.4 F (36.9 C) (Oral)  Resp 16  Ht _0  (1.803 m)  Wt 201 lb (91.173 kg)  BMI 28.05 kg/m2  SpO2 98% Physical Exam  Nursing note and vitals reviewed. Constitutional: He is oriented to person, place, and time. He appears well-developed and well-nourished.  Non-toxic appearance. No distress.  HENT:  Head: Normocephalic and atraumatic.  Eyes: Conjunctivae, EOM and lids are normal. Pupils are  equal, round, and reactive to light.  Neck: Normal range of motion. Neck supple. No tracheal deviation present. No mass present.  Cardiovascular: Normal rate, regular rhythm and normal heart sounds.  Exam reveals no gallop.   No murmur heard. Pulmonary/Chest: Effort normal and breath sounds normal. No stridor. No respiratory distress. He has no decreased breath sounds. He has no wheezes. He has no rhonchi. He has no rales.  Abdominal: Soft. Normal appearance and bowel sounds are normal. He  exhibits no distension. There is no tenderness. There is no rebound and no CVA tenderness.  Musculoskeletal: Normal range of motion. He exhibits no edema and no tenderness.       Arms: Neurological: He is alert and oriented to person, place, and time. He displays no atrophy. No cranial nerve deficit or sensory deficit. GCS eye subscore is 4. GCS verbal subscore is 5. GCS motor subscore is 6.  Reflex Scores:      Patellar reflexes are 0 on the right side and 3+ on the left side. Right lower extremity strength 4/5. Left lower extremity strength normal.  Skin: Skin is warm and dry. No abrasion and no rash noted.  Psychiatric: He has a normal mood and affect. His speech is normal and behavior is normal.    ED Course  Procedures (including critical care time) Labs Review Labs Reviewed  CBC WITH DIFFERENTIAL  BASIC METABOLIC PANEL    Imaging Review Mr Lumbar Spine Wo Contrast  10/02/2013   CLINICAL DATA:  Recent onset of severe back pain. History of myeloma. Renal failure.  EXAM: MRI LUMBAR SPINE WITHOUT CONTRAST  TECHNIQUE: Multiplanar, multisequence MR imaging of the lumbar spine was performed. No intravenous contrast was administered.  COMPARISON:  12/29/2012.  FINDINGS: Segmentation: The numbering convention used for this exam termed L5-S1 as the last intervertebral disc space. Five lumbar type vertebral bodies identified on prior radiographs.  Alignment: Mild dextroconvex curve with the apex at L4. Grade I retrolisthesis of L5 on S1 measuring 4 mm.  Vertebrae: Vertebral body height preserved. Bone marrow signal shows suppression of normal fatty marrow. This is a nonspecific finding most commonly associated with obesity, anemia, cigarette smoking or chronic disease. This is most compatible with renal osteodystrophy.  Conus medullaris: Normal termination at L1.  Paraspinal tissues: Bilateral renal cysts.  Disc levels:  T12-L1:  RIGHT lateral disc and osteophyte.  No stenosis.  L1-L2:  Negative.   L2-L3:  RIGHT anterior lateral disc and osteophyte.  No stenosis.  L3-L4:  Negative.  L4-L5: Disc desiccation and degeneration. RIGHT foraminal and lateral disc protrusion is present contacting the exiting RIGHT L4 nerve. Mild RIGHT foraminal stenosis. LEFT foramen and central canal patent.  L5-S1: LEFT central protrusion is present contacting the descending LEFT S1 nerve. No neural compression. Broad-based disc bulging is present. Disc desiccation. Central canal and RIGHT lateral recess patent. Foramina patent.  No evidence of spinal infection is present. No paravertebral phlegmon.  IMPRESSION: Mild lumbar spondylosis detailed above. 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 No compression fracture or destructive osseous lesions.   Electronically Signed   By: Dereck Ligas M.D.   On: 10/02/2013 13:32     EKG Interpretation None      MDM   Final diagnoses:  None     Patient given pain medications here and feels better. Patient's MRI results reviewed with him and he feels better at this time and will be discharged home. No evidence of cauda equina syndrome  Leota Jacobsen, MD 10/02/13 [phone]

## 2013-10-04 ENCOUNTER — Telehealth: Payer: Self-pay | Admitting: *Deleted

## 2013-10-04 NOTE — Telephone Encounter (Signed)
Called to check pt status. Left voicemail for pt to call us back.

## 2013-10-05 ENCOUNTER — Telehealth: Payer: Self-pay

## 2013-10-05 NOTE — Telephone Encounter (Signed)
Received call from pt with update on back pain.   Per [name], meds provided in ED (valium) + narcotic pain meds do "knock the edge off," although pain is never completely relieved. Reports he is still unable to lay flat and position changes are difficult. MRI performed in ED reviewed by Dr [name]. Per PRE, no changes are indicated from an oncologic standpoint, but pt may benefit from contacting rheumatologist. This information left on pt's personalized VM. dph

## 2013-10-06 ENCOUNTER — Ambulatory Visit (HOSPITAL_COMMUNITY)
Admission: RE | Admit: 2013-10-06 | Discharge: 2013-10-06 | Disposition: A | Discharge status: Home / Self Care | Payer: Medicaid Other | Source: Ambulatory Visit | Attending: Diagnostic Radiology | Admitting: Diagnostic Radiology

## 2013-10-06 DIAGNOSIS — N189 Chronic kidney disease, unspecified: Secondary | ICD-10-CM

## 2013-10-06 DIAGNOSIS — R141 Gas pain: Secondary | ICD-10-CM | POA: Diagnosis not present

## 2013-10-06 DIAGNOSIS — R143 Flatulence: Secondary | ICD-10-CM

## 2013-10-06 DIAGNOSIS — R6881 Early satiety: Secondary | ICD-10-CM | POA: Insufficient documentation

## 2013-10-06 DIAGNOSIS — D509 Iron deficiency anemia, unspecified: Secondary | ICD-10-CM

## 2013-10-06 DIAGNOSIS — R110 Nausea: Secondary | ICD-10-CM

## 2013-10-06 DIAGNOSIS — D631 Anemia in chronic kidney disease: Secondary | ICD-10-CM

## 2013-10-06 DIAGNOSIS — E349 Endocrine disorder, unspecified: Secondary | ICD-10-CM

## 2013-10-06 DIAGNOSIS — R142 Eructation: Secondary | ICD-10-CM

## 2013-10-06 DIAGNOSIS — C9000 Multiple myeloma not having achieved remission: Secondary | ICD-10-CM

## 2013-10-06 MED ORDER — TECHNETIUM TC 99M SULFUR COLLOID
2.1000 | Freq: Once | INTRAVENOUS | Status: AC | PRN
  Administered 2013-10-06: 2.1 via INTRAVENOUS

## 2013-10-10 ENCOUNTER — Telehealth: Payer: Self-pay | Admitting: *Deleted

## 2013-10-10 NOTE — Telephone Encounter (Signed)
Message copied by Rico Ala on Tue Oct 10, 2013 10:09 AM ------      Message from: Volanda Napoleon      Created: Sun Oct 08, 2013  8:26 PM       Call - stomach empties ok!!  pete ------

## 2013-10-10 NOTE — Telephone Encounter (Signed)
Called patient to let him know his stomach is emptying ok per d.r ennever

## 2013-10-12 ENCOUNTER — Ambulatory Visit (HOSPITAL_BASED_OUTPATIENT_CLINIC_OR_DEPARTMENT_OTHER): Payer: Medicaid Other

## 2013-10-12 ENCOUNTER — Ambulatory Visit (HOSPITAL_BASED_OUTPATIENT_CLINIC_OR_DEPARTMENT_OTHER): Payer: Medicaid Other | Admitting: Hematology & Oncology

## 2013-10-12 ENCOUNTER — Ambulatory Visit (HOSPITAL_BASED_OUTPATIENT_CLINIC_OR_DEPARTMENT_OTHER): Payer: Medicaid Other | Admitting: Lab

## 2013-10-12 VITALS — BP 147/67 | HR 91 | Temp 98.2°F | Resp 20 | Ht 70.0 in | Wt 203.0 lb

## 2013-10-12 DIAGNOSIS — C9000 Multiple myeloma not having achieved remission: Secondary | ICD-10-CM

## 2013-10-12 DIAGNOSIS — M545 Low back pain, unspecified: Secondary | ICD-10-CM

## 2013-10-12 DIAGNOSIS — D509 Iron deficiency anemia, unspecified: Secondary | ICD-10-CM

## 2013-10-12 DIAGNOSIS — D631 Anemia in chronic kidney disease: Secondary | ICD-10-CM

## 2013-10-12 DIAGNOSIS — D649 Anemia, unspecified: Secondary | ICD-10-CM

## 2013-10-12 DIAGNOSIS — N289 Disorder of kidney and ureter, unspecified: Secondary | ICD-10-CM

## 2013-10-12 DIAGNOSIS — R110 Nausea: Secondary | ICD-10-CM

## 2013-10-12 DIAGNOSIS — E349 Endocrine disorder, unspecified: Secondary | ICD-10-CM

## 2013-10-12 DIAGNOSIS — N189 Chronic kidney disease, unspecified: Secondary | ICD-10-CM

## 2013-10-12 LAB — CMP (CANCER CENTER ONLY)
ALK PHOS: 69 U/L (ref 26–84)
ALT(SGPT): 25 U/L (ref 10–47)
AST: 22 U/L (ref 11–38)
Albumin: 3.5 g/dL (ref 3.3–5.5)
BILIRUBIN TOTAL: 0.5 mg/dL (ref 0.20–1.60)
BUN, Bld: 27 mg/dL — ABNORMAL HIGH (ref 7–22)
CO2: 29 meq/L (ref 18–33)
Calcium: 8.9 mg/dL (ref 8.0–10.3)
Chloride: 98 mEq/L (ref 98–108)
Creat: 2.2 mg/dl — ABNORMAL HIGH (ref 0.6–1.2)
Glucose, Bld: 157 mg/dL — ABNORMAL HIGH (ref 73–118)
Potassium: 3.3 mEq/L (ref 3.3–4.7)
Sodium: 140 mEq/L (ref 128–145)
TOTAL PROTEIN: 8.8 g/dL — AB (ref 6.4–8.1)

## 2013-10-12 LAB — CBC WITH DIFFERENTIAL (CANCER CENTER ONLY)
BASO#: 0.1 10*3/uL (ref 0.0–0.2)
BASO%: 0.9 % (ref 0.0–2.0)
EOS%: 3.1 % (ref 0.0–7.0)
Eosinophils Absolute: 0.2 10*3/uL (ref 0.0–0.5)
HEMATOCRIT: 36.2 % — AB (ref 38.7–49.9)
HGB: 12.1 g/dL — ABNORMAL LOW (ref 13.0–17.1)
LYMPH#: 1.7 10*3/uL (ref 0.9–3.3)
LYMPH%: 23.2 % (ref 14.0–48.0)
MCH: 25.7 pg — AB (ref 28.0–33.4)
MCHC: 33.4 g/dL (ref 32.0–35.9)
MCV: 77 fL — AB (ref 82–98)
MONO#: 0.8 10*3/uL (ref 0.1–0.9)
MONO%: 10.1 % (ref 0.0–13.0)
NEUT#: 4.7 10*3/uL (ref 1.5–6.5)
NEUT%: 62.7 % (ref 40.0–80.0)
Platelets: 256 10*3/uL (ref 145–400)
RBC: 4.7 10*6/uL (ref 4.20–5.70)
RDW: 17.1 % — ABNORMAL HIGH (ref 11.1–15.7)
WBC: 7.4 10*3/uL (ref 4.0–10.0)

## 2013-10-12 MED ORDER — HYDROMORPHONE HCL PF 1 MG/ML IJ SOLN
2.0000 mg | Freq: Once | INTRAMUSCULAR | Status: AC
  Administered 2013-10-12: 2 mg via INTRAVENOUS

## 2013-10-12 MED ORDER — KETOROLAC TROMETHAMINE 30 MG/ML IJ SOLN
30.0000 mg | Freq: Once | INTRAMUSCULAR | Status: AC
  Administered 2013-10-12: 30 mg via INTRAVENOUS
  Filled 2013-10-12: qty 1

## 2013-10-12 MED ORDER — DIAZEPAM 5 MG/ML IJ SOLN
INTRAMUSCULAR | Status: AC
  Filled 2013-10-12: qty 2

## 2013-10-12 MED ORDER — HYDROMORPHONE HCL PF 1 MG/ML IJ SOLN
INTRAMUSCULAR | Status: AC
  Filled 2013-10-12: qty 2

## 2013-10-12 MED ORDER — SODIUM CHLORIDE 0.9 % IV SOLN
Freq: Once | INTRAVENOUS | Status: AC
  Administered 2013-10-12: 14:00:00 via INTRAVENOUS

## 2013-10-12 MED ORDER — DIAZEPAM 5 MG/ML IJ SOLN
5.0000 mg | Freq: Once | INTRAMUSCULAR | Status: AC
  Administered 2013-10-12: 5 mg via INTRAVENOUS

## 2013-10-12 NOTE — Progress Notes (Signed)
Hematology and Oncology Follow Up Visit  Littleton Ibara [id number] 1950/07/16 63 y.o. 10/12/2013   Principle Diagnosis:  1. IgG kappa myeloma. 2. Anemia secondary to renal insufficiency. 3. Intermittent iron-deficiency anemia. 4. Hypotestosteronemia  Current Therapy:   1. Aredia 60 mg IV q.2 months. 2. Aranesp 300 mcg subcu as needed for hemoglobin less than      11. 3. Depo-Testosterone 300 mg q.2 weeks. 4. IV iron as indicated.     Interim History:  Mr.  Grosser is comes in for an a scheduled visit. He's been complaining of a lot of lower back pain over the past couple weeks. We saw him about 2 or 3 weeks ago. He was done well we saw him here.  He said that he just woke up one day and this could barely move. He says there is a lot of pain in the lower back. He has some leg pain bilaterally. There is no problems with bowels or bladder. He's had no nausea or vomiting.  He went to the emergency room. He did have an MRI done. There is no fracture. There is no mention of myelomatous lesions in the spine. He does have disc herniation at L4-L5 and L5-S1. These seem to be pushing on some a spinal nerve roots. This might be where his problem list.  He says he was given some Valium. He says the Valium seems to help more than anything else. He is on OxyContin and Percocet. These do seem to help a little bit.  He has seen a spine surgeon in the past. Has been several years however.  I do not think that any of this is related to his myeloma. I this is all arthritic issues and just wear and tear on his spine and some bony changes probably from chronic renal insufficiency and possibly diabetes.  Medications: Current outpatient prescriptions:amLODipine (NORVASC) 10 MG tablet, Take 10 mg by mouth daily.  , Disp: , Rfl: ;  aspirin 81 MG tablet, Take 81 mg by mouth daily., Disp: , Rfl: ;  CARAFATE 1 GM/10ML suspension, Take 1 g by mouth 2 (two) times daily. , Disp: , Rfl: ;  diazepam (VALIUM) 5 MG  tablet, Take 5 mg by mouth every 6 (six) hours as needed for anxiety. For muscle spasm, Disp: , Rfl:  ergocalciferol (VITAMIN D2) 50000 UNITS capsule, Take 50,000 Units by mouth once a week.  , Disp: , Rfl: ;  furosemide (LASIX) 20 MG tablet, Take 20 mg by mouth daily.  , Disp: , Rfl: ;  glipiZIDE (GLUCOTROL) 5 MG tablet, Take 5 mg by mouth daily. , Disp: , Rfl: ;  hydrALAZINE (APRESOLINE) 25 MG tablet, Take 25 mg by mouth 3 (three) times daily., Disp: , Rfl: ;  Hypromellose (NATURAL BALANCE TEARS OP), Apply to eye every morning., Disp: , Rfl:  meclizine (ANTIVERT) 50 MG tablet, Take 0.5 tablets (25 mg total) by mouth 4 (four) times daily as needed for dizziness., Disp: 30 tablet, Rfl: 0;  methocarbamol (ROBAXIN) 500 MG tablet, Take 500 mg by mouth 2 (two) times daily as needed for muscle spasms. , Disp: , Rfl: ;  omeprazole (PRILOSEC) 20 MG capsule, Take 1 capsule (20 mg total) by mouth daily., Disp: 30 capsule, Rfl: 11 oxyCODONE (OXYCONTIN) 80 MG 12 hr tablet, Take 80 mg by mouth 3 (three) times daily., Disp: , Rfl: ;  oxyCODONE-acetaminophen (PERCOCET) 10-325 MG per tablet, Take 1 tablet by mouth every 4 (four) hours as needed. Pain, Disp: , Rfl: ;  polyethylene glycol (MIRALAX / GLYCOLAX) packet, Take 17 g by mouth daily., Disp: , Rfl: ;  potassium chloride SA (K-DUR,KLOR-CON) 20 MEQ tablet, Take 20 mEq by mouth as needed (takes with furosemide). , Disp: , Rfl:  Tamsulosin HCl (FLOMAX) 0.4 MG CAPS, Take 0.4 mg by mouth daily. , Disp: , Rfl: ;  triamterene-hydrochlorothiazide (MAXZIDE-25) 37.5-25 MG per tablet, Take 1 tablet by mouth daily.  , Disp: , Rfl: ;  [DISCONTINUED] potassium chloride (KLOR-CON) 20 MEQ packet, Take 20 mEq by mouth daily. , Disp: , Rfl:   Allergies:  Allergies  Allergen Reactions  . Iodine Rash    Past Medical History, Surgical history, Social history, and Family History were reviewed and updated.  Review of Systems: As above  Physical Exam:  height is 5\' 10"  (1.778 m)  and weight is 203 lb (92.08 kg). His oral temperature is 98.2 F (36.8 C). His blood pressure is 147/67 and his pulse is 91. His respiration is 20.   Lungs are clear. Cardiac exam rhythm. Abdomen soft. He has good bowel sounds. There is no fluid wave. There is no palpable liver or spleen tip. Exam shows some tenderness in the lumbar spine. Some paravertebral muscle spasms are noted. He has decreased range of motion of the lower back. Extremities shows no clubbing cyanosis or edema. He has decent strength in his legs. Skin exam no rashes. Neurological exam is nonfocal.  Lab Results  Component Value Date   WBC 7.4 10/12/2013   HGB 12.1* 10/12/2013   HCT 36.2* 10/12/2013   MCV 77* 10/12/2013   PLT 256 10/12/2013     Chemistry      Component Value Date/Time   NA 140 10/12/2013 1115   NA 134* 09/25/2013 0802   K 3.3 10/12/2013 1115   K 3.6 09/25/2013 0802   CL 98 10/12/2013 1115   CL 94* 09/25/2013 0802   CO2 29 10/12/2013 1115   CO2 26 09/25/2013 0802   BUN 27* 10/12/2013 1115   BUN 37* 09/25/2013 0802   CREATININE 2.2* 10/12/2013 1115   CREATININE 2.44* 09/25/2013 0802      Component Value Date/Time   CALCIUM 8.9 10/12/2013 1115   CALCIUM 9.1 09/25/2013 0802   ALKPHOS 69 10/12/2013 1115   ALKPHOS 69 09/25/2013 0802   AST 22 10/12/2013 1115   AST 16 09/25/2013 0802   ALT 25 10/12/2013 1115   ALT 16 09/25/2013 0802   BILITOT 0.50 10/12/2013 1115   BILITOT 0.4 09/25/2013 0802         Impression and Plan: Mr. Pederson is 63 year old abdomen. He has smoldering myeloma. However, the problem right now is her back. Again this does not seem to be anything related to myeloma. He still is a might be from his low testosterone. However, we are giving him testosterone injections. He is getting Aredia.  We will probably have to get him to a back surgeon again. We will see about getting him referred.  In the office today, I will give him some IV fluids. I will give him some ketorolac along with some Valium.  He will keep  his regular appointment with [place].   Volanda Napoleon, MD 8/20/20152:11 PM

## 2013-10-12 NOTE — Patient Instructions (Signed)
Back Pain, Adult Low back pain is very common. About 1 in 5 people have back pain.The cause of low back pain is rarely dangerous. The pain often gets better over time.About half of people with a sudden onset of back pain feel better in just 2 weeks. About 8 in 10 people feel better by 6 weeks.  CAUSES Some common causes of back pain include:  Strain of the muscles or ligaments supporting the spine.  Wear and tear (degeneration) of the spinal discs.  Arthritis.  Direct injury to the back. DIAGNOSIS Most of the time, the direct cause of low back pain is not known.However, back pain can be treated effectively even when the exact cause of the pain is unknown.Answering your caregiver's questions about your overall health and symptoms is one of the most accurate ways to make sure the cause of your pain is not dangerous. If your caregiver needs more information, he or she may order lab work or imaging tests (X-rays or MRIs).However, even if imaging tests show changes in your back, this usually does not require surgery. HOME CARE INSTRUCTIONS For many people, back pain returns.Since low back pain is rarely dangerous, it is often a condition that people can learn to manageon their own.   Remain active. It is stressful on the back to sit or stand in one place. Do not sit, drive, or stand in one place for more than 30 minutes at a time. Take short walks on level surfaces as soon as pain allows.Try to increase the length of time you walk each day.  Do not stay in bed.Resting more than 1 or 2 days can delay your recovery.  Do not avoid exercise or work.Your body is made to move.It is not dangerous to be active, even though your back may hurt.Your back will likely heal faster if you return to being active before your pain is gone.  Pay attention to your body when you bend and lift. Many people have less discomfortwhen lifting if they bend their knees, keep the load close to their bodies,and  avoid twisting. Often, the most comfortable positions are those that put less stress on your recovering back.  Find a comfortable position to sleep. Use a firm mattress and lie on your side with your knees slightly bent. If you lie on your back, put a pillow under your knees.  Only take over-the-counter or prescription medicines as directed by your caregiver. Over-the-counter medicines to reduce pain and inflammation are often the most helpful.Your caregiver may prescribe muscle relaxant drugs.These medicines help dull your pain so you can more quickly return to your normal activities and healthy exercise.  Put ice on the injured area.  Put ice in a plastic bag.  Place a towel between your skin and the bag.  Leave the ice on for 15-20 minutes, 03-04 times a day for the first 2 to 3 days. After that, ice and heat may be alternated to reduce pain and spasms.  Ask your caregiver about trying back exercises and gentle massage. This may be of some benefit.  Avoid feeling anxious or stressed.Stress increases muscle tension and can worsen back pain.It is important to recognize when you are anxious or stressed and learn ways to manage it.Exercise is a great option. SEEK MEDICAL CARE IF:  You have pain that is not relieved with rest or medicine.  You have pain that does not improve in 1 week.  You have new symptoms.  You are generally not feeling well. SEEK   IMMEDIATE MEDICAL CARE IF:   You have pain that radiates from your back into your legs.  You develop new bowel or bladder control problems.  You have unusual weakness or numbness in your arms or legs.  You develop nausea or vomiting.  You develop abdominal pain.  You feel faint. Document Released: 02/09/2005 Document Revised: 08/11/2011 Document Reviewed: 06/13/2013 [employer] Patient Information 2015 [employer]. This information is not intended to replace advice given to you by your health care provider. Make sure you  discuss any questions you have with your health care provider.  

## 2013-10-13 LAB — IRON AND TIBC CHCC
%SAT: 33 % (ref 20–55)
Iron: 69 ug/dL (ref 42–163)
TIBC: 209 ug/dL (ref 202–409)
UIBC: 140 ug/dL (ref 117–376)

## 2013-10-13 LAB — FERRITIN CHCC: Ferritin: 2463 ng/ml — ABNORMAL HIGH (ref 22–316)

## 2013-10-16 LAB — KAPPA/LAMBDA LIGHT CHAINS
KAPPA FREE LGHT CHN: 5.59 mg/dL — AB (ref 0.33–1.94)
Kappa:Lambda Ratio: 1.5 (ref 0.26–1.65)
Lambda Free Lght Chn: 3.73 mg/dL — ABNORMAL HIGH (ref 0.57–2.63)

## 2013-10-16 LAB — PROTEIN ELECTROPHORESIS, SERUM, WITH REFLEX
ALBUMIN ELP: 48.4 % — AB (ref 55.8–66.1)
Alpha-1-Globulin: 4 % (ref 2.9–4.9)
Alpha-2-Globulin: 13.3 % — ABNORMAL HIGH (ref 7.1–11.8)
Beta 2: 6.3 % (ref 3.2–6.5)
Beta Globulin: 5.2 % (ref 4.7–7.2)
Gamma Globulin: 22.8 % — ABNORMAL HIGH (ref 11.1–18.8)
M-Spike, %: 0.79 g/dL
Total Protein, Serum Electrophoresis: 8.5 g/dL — ABNORMAL HIGH (ref 6.0–8.3)

## 2013-10-16 LAB — TESTOSTERONE: TESTOSTERONE: 724 ng/dL (ref 300–890)

## 2013-10-16 LAB — IFE INTERPRETATION

## 2013-10-16 LAB — IGG, IGA, IGM
IgA: 427 mg/dL — ABNORMAL HIGH (ref 68–379)
IgG (Immunoglobin G), Serum: 2020 mg/dL — ABNORMAL HIGH (ref 650–1600)
IgM, Serum: 74 mg/dL (ref 41–251)

## 2013-10-23 ENCOUNTER — Ambulatory Visit: Payer: Medicaid Other

## 2013-10-23 ENCOUNTER — Other Ambulatory Visit: Payer: Self-pay | Admitting: *Deleted

## 2013-10-23 ENCOUNTER — Ambulatory Visit: Payer: Medicaid Other | Admitting: Hematology & Oncology

## 2013-10-23 ENCOUNTER — Other Ambulatory Visit: Payer: Medicaid Other | Admitting: Lab

## 2013-10-23 DIAGNOSIS — C9000 Multiple myeloma not having achieved remission: Secondary | ICD-10-CM

## 2013-10-24 ENCOUNTER — Ambulatory Visit (HOSPITAL_BASED_OUTPATIENT_CLINIC_OR_DEPARTMENT_OTHER): Payer: Medicaid Other | Admitting: Hematology & Oncology

## 2013-10-24 ENCOUNTER — Telehealth: Payer: Self-pay | Admitting: Hematology & Oncology

## 2013-10-24 ENCOUNTER — Ambulatory Visit (HOSPITAL_BASED_OUTPATIENT_CLINIC_OR_DEPARTMENT_OTHER): Payer: Medicaid Other

## 2013-10-24 ENCOUNTER — Ambulatory Visit: Payer: Medicaid Other

## 2013-10-24 ENCOUNTER — Other Ambulatory Visit (HOSPITAL_BASED_OUTPATIENT_CLINIC_OR_DEPARTMENT_OTHER): Payer: Medicaid Other | Admitting: Lab

## 2013-10-24 ENCOUNTER — Encounter: Payer: Self-pay | Admitting: Hematology & Oncology

## 2013-10-24 VITALS — BP 148/75 | HR 79 | Temp 97.0°F | Resp 18 | Ht 70.0 in | Wt 199.0 lb

## 2013-10-24 DIAGNOSIS — E349 Endocrine disorder, unspecified: Secondary | ICD-10-CM

## 2013-10-24 DIAGNOSIS — Z23 Encounter for immunization: Secondary | ICD-10-CM

## 2013-10-24 DIAGNOSIS — M545 Low back pain, unspecified: Secondary | ICD-10-CM

## 2013-10-24 DIAGNOSIS — C9000 Multiple myeloma not having achieved remission: Secondary | ICD-10-CM

## 2013-10-24 DIAGNOSIS — M2550 Pain in unspecified joint: Secondary | ICD-10-CM

## 2013-10-24 DIAGNOSIS — E291 Testicular hypofunction: Secondary | ICD-10-CM

## 2013-10-24 LAB — CBC WITH DIFFERENTIAL (CANCER CENTER ONLY)
BASO#: 0 10*3/uL (ref 0.0–0.2)
BASO%: 0.5 % (ref 0.0–2.0)
EOS%: 2.6 % (ref 0.0–7.0)
Eosinophils Absolute: 0.2 10*3/uL (ref 0.0–0.5)
HCT: 34.6 % — ABNORMAL LOW (ref 38.7–49.9)
HGB: 11.6 g/dL — ABNORMAL LOW (ref 13.0–17.1)
LYMPH#: 2.2 10*3/uL (ref 0.9–3.3)
LYMPH%: 26 % (ref 14.0–48.0)
MCH: 25.9 pg — ABNORMAL LOW (ref 28.0–33.4)
MCHC: 33.5 g/dL (ref 32.0–35.9)
MCV: 77 fL — ABNORMAL LOW (ref 82–98)
MONO#: 0.8 10*3/uL (ref 0.1–0.9)
MONO%: 9.7 % (ref 0.0–13.0)
NEUT#: 5.2 10*3/uL (ref 1.5–6.5)
NEUT%: 61.2 % (ref 40.0–80.0)
Platelets: 253 10*3/uL (ref 145–400)
RBC: 4.48 10*6/uL (ref 4.20–5.70)
RDW: 16.4 % — AB (ref 11.1–15.7)
WBC: 8.4 10*3/uL (ref 4.0–10.0)

## 2013-10-24 MED ORDER — DIAZEPAM 5 MG/ML IJ SOLN: 5.0000 mg | Freq: Once | INTRAMUSCULAR | Status: DC

## 2013-10-24 MED ORDER — TESTOSTERONE CYPIONATE 200 MG/ML IM SOLN
INTRAMUSCULAR | Status: AC
  Filled 2013-10-24: qty 2

## 2013-10-24 MED ORDER — TESTOSTERONE CYPIONATE 200 MG/ML IM SOLN
300.0000 mg | INTRAMUSCULAR | Status: DC
  Administered 2013-10-24: 300 mg via INTRAMUSCULAR

## 2013-10-24 MED ORDER — DIAZEPAM 5 MG/ML IJ SOLN
5.0000 mg | Freq: Once | INTRAMUSCULAR | Status: AC
  Administered 2013-10-24: 12:00:00 via INTRAVENOUS

## 2013-10-24 MED ORDER — KETOROLAC TROMETHAMINE 15 MG/ML IJ SOLN: 15.0000 mg | Freq: Once | INTRAMUSCULAR | Status: DC

## 2013-10-24 MED ORDER — HEPARIN SOD (PORK) LOCK FLUSH 100 UNIT/ML IV SOLN
500.0000 [IU] | Freq: Once | INTRAVENOUS | Status: AC
  Administered 2013-10-24: 500 [IU] via INTRAVENOUS
  Filled 2013-10-24: qty 5

## 2013-10-24 MED ORDER — MELOXICAM 15 MG PO TABS: 15.0000 mg | ORAL_TABLET | Freq: Every day | ORAL | Status: DC

## 2013-10-24 MED ORDER — DIAZEPAM 5 MG/ML IJ SOLN
INTRAMUSCULAR | Status: AC
  Filled 2013-10-24: qty 2

## 2013-10-24 MED ORDER — SODIUM CHLORIDE 0.9 % IJ SOLN
10.0000 mL | INTRAMUSCULAR | Status: DC | PRN
  Administered 2013-10-24: 10 mL via INTRAVENOUS
  Filled 2013-10-24: qty 10

## 2013-10-24 MED ORDER — SODIUM CHLORIDE 0.9 % IV SOLN
Freq: Once | INTRAVENOUS | Status: AC
  Administered 2013-10-24: 11:00:00 via INTRAVENOUS

## 2013-10-24 MED ORDER — INFLUENZA VAC SPLIT QUAD 0.5 ML IM SUSY
0.5000 mL | PREFILLED_SYRINGE | Freq: Once | INTRAMUSCULAR | Status: AC
  Administered 2013-10-24: 0.5 mL via INTRAMUSCULAR
  Filled 2013-10-24: qty 0.5

## 2013-10-24 MED ORDER — KETOROLAC TROMETHAMINE 15 MG/ML IJ SOLN
15.0000 mg | Freq: Once | INTRAMUSCULAR | Status: AC
  Administered 2013-10-24: 15 mg via INTRAVENOUS

## 2013-10-24 MED ORDER — KETOROLAC TROMETHAMINE 15 MG/ML IJ SOLN
INTRAMUSCULAR | Status: AC
  Filled 2013-10-24: qty 1

## 2013-10-24 MED ORDER — DIAZEPAM 5 MG PO TABS: 5.0000 mg | ORAL_TABLET | Freq: Four times a day (QID) | ORAL | Status: DC | PRN

## 2013-10-24 MED ORDER — SODIUM CHLORIDE 0.9 % IV SOLN: INTRAVENOUS | Status: DC

## 2013-10-24 NOTE — Progress Notes (Signed)
Hematology and Oncology Follow Up Visit  [name] [id number] February 21, 1951 63 y.o. 10/24/2013   Principle Diagnosis:  1. IgG kappa myeloma. 2. Anemia secondary to renal insufficiency. 3. Intermittent iron-deficiency anemia. 4. Hypotestosteronemia  Current Therapy:   1. Aredia 60 mg IV q.2 months. 2. Aranesp 300 mcg subcu as needed for hemoglobin less than      11. 3. Depo-Testosterone 300 mg q.2 weeks. 4. IV iron as indicated.     Interim History:  [name] is comes in for an a scheduled visit. He's been complaining of a lot of lower back pain over the past couple weeks. We saw him about 2 or 3 weeks ago. He was done well we saw him here.  He said that he just woke up one day and this could barely move. He says there is a lot of pain in the lower back. He has some leg pain bilaterally. There is no problems with bowels or bladder. He's had no nausea or vomiting.  He went to the emergency room. He did have an MRI done. There is no fracture. There is no mention of myelomatous lesions in the spine. He does have disc herniation at L4-L5 and L5-S1. These seem to be pushing on some a spinal nerve roots. This might be where his problem list.  He says he was given some Valium. He says the Valium seems to help more than anything else. He is on OxyContin and Percocet. These do seem to help a little bit.   When he was here last time, we him some IV fluids. We gave him some Toradol. This helped quite a bit. We also gave him some Valium. This also helped. I will see about maybe some oral Toradol.    He does see the spine surgeon in one week   . I do not think that any of this is related to his myeloma. will recheck his myeloma studies, just 2 weeks ago, his monoclonal spike was 0.79 g/L. His Ig G. level was 2020 mg/dL. His kappa light chain was a 5.59 mg/dL. These are all pretty stable. However, I'm sure that the orthopedic surgeon, might think that his myeloma is causing his problems. If  that were the case, I would think that his MRI was shown myeloma lesions which it doesn't.   I this is all arthritic issues and just wear and tear on his spine and some bony changes probably from chronic renal insufficiency and possibly diabetes.  Medications: Current outpatient prescriptions:amLODipine (NORVASC) 10 MG tablet, Take 10 mg by mouth daily.  , Disp: , Rfl: ;  aspirin 81 MG tablet, Take 81 mg by mouth daily., Disp: , Rfl: ;  CARAFATE 1 GM/10ML suspension, Take 1 g by mouth 2 (two) times daily. , Disp: , Rfl: ;  diazepam (VALIUM) 5 MG tablet, Take 5 mg by mouth every 6 (six) hours as needed for anxiety. For muscle spasm, Disp: , Rfl:  ergocalciferol (VITAMIN D2) 50000 UNITS capsule, Take 50,000 Units by mouth once a week.  , Disp: , Rfl: ;  furosemide (LASIX) 20 MG tablet, Take 20 mg by mouth daily.  , Disp: , Rfl: ;  glipiZIDE (GLUCOTROL) 5 MG tablet, Take 5 mg by mouth daily. , Disp: , Rfl: ;  hydrALAZINE (APRESOLINE) 25 MG tablet, Take 25 mg by mouth 3 (three) times daily., Disp: , Rfl: ;  Hypromellose (NATURAL BALANCE TEARS OP), Apply to eye every morning., Disp: , Rfl:  meclizine (ANTIVERT) 50 MG tablet,  Take 0.5 tablets (25 mg total) by mouth 4 (four) times daily as needed for dizziness., Disp: 30 tablet, Rfl: 0;  methocarbamol (ROBAXIN) 500 MG tablet, Take 500 mg by mouth 2 (two) times daily as needed for muscle spasms. , Disp: , Rfl: ;  omeprazole (PRILOSEC) 20 MG capsule, Take 1 capsule (20 mg total) by mouth daily., Disp: 30 capsule, Rfl: 11 oxyCODONE (OXYCONTIN) 80 MG 12 hr tablet, Take 80 mg by mouth 3 (three) times daily., Disp: , Rfl: ;  oxyCODONE-acetaminophen (PERCOCET) 10-325 MG per tablet, Take 1 tablet by mouth every 4 (four) hours as needed. Pain, Disp: , Rfl: ;  polyethylene glycol (MIRALAX / GLYCOLAX) packet, Take 17 g by mouth daily., Disp: , Rfl: ;  potassium chloride SA (K-DUR,KLOR-CON) 20 MEQ tablet, Take 20 mEq by mouth as needed (takes with furosemide). , Disp: , Rfl:   Tamsulosin HCl (FLOMAX) 0.4 MG CAPS, Take 0.4 mg by mouth daily. , Disp: , Rfl: ;  triamterene-hydrochlorothiazide (MAXZIDE-25) 37.5-25 MG per tablet, Take 1 tablet by mouth daily.  , Disp: , Rfl: ;  [DISCONTINUED] potassium chloride (KLOR-CON) 20 MEQ packet, Take 20 mEq by mouth daily. , Disp: , Rfl:  No current facility-administered medications for this visit. Facility-Administered Medications Ordered in Other Visits: testosterone cypionate (DEPOTESTOTERONE CYPIONATE) injection 300 mg, 300 mg, Intramuscular, Q14 Days, Volanda Napoleon, MD  Allergies:  Allergies  Allergen Reactions  . Iodine Rash    Past Medical History, Surgical history, Social history, and Family History were reviewed and updated.  Review of Systems: As above  Physical Exam:  height is 5\' 10"  (1.778 m) and weight is 199 lb (90.266 kg). His oral temperature is 97 F (36.1 C). His blood pressure is 148/75 and his pulse is 79. His respiration is 18.   Lungs are clear. Cardiac exam rhythm. Abdomen soft. He has good bowel sounds. There is no fluid wave. There is no palpable liver or spleen tip. Exam shows some tenderness in the lumbar spine. Some paravertebral muscle spasms are noted. He has decreased range of motion of the lower back. Extremities shows no clubbing cyanosis or edema. He has decent strength in his legs. Skin exam no rashes. Neurological exam is nonfocal.  Lab Results  Component Value Date   WBC 8.4 10/24/2013   HGB 11.6* 10/24/2013   HCT 34.6* 10/24/2013   MCV 77* 10/24/2013   PLT 253 10/24/2013     Chemistry      Component Value Date/Time   NA 140 10/12/2013 1115   NA 134* 09/25/2013 0802   K 3.3 10/12/2013 1115   K 3.6 09/25/2013 0802   CL 98 10/12/2013 1115   CL 94* 09/25/2013 0802   CO2 29 10/12/2013 1115   CO2 26 09/25/2013 0802   BUN 27* 10/12/2013 1115   BUN 37* 09/25/2013 0802   CREATININE 2.2* 10/12/2013 1115   CREATININE 2.44* 09/25/2013 0802      Component Value Date/Time   CALCIUM 8.9 10/12/2013 1115    CALCIUM 9.1 09/25/2013 0802   ALKPHOS 69 10/12/2013 1115   ALKPHOS 69 09/25/2013 0802   AST 22 10/12/2013 1115   AST 16 09/25/2013 0802   ALT 25 10/12/2013 1115   ALT 16 09/25/2013 0802   BILITOT 0.50 10/12/2013 1115   BILITOT 0.4 09/25/2013 0802         Impression and Plan: [name] is 63 year old abdomen. He has smoldering myeloma. However, the problem right now is his back. Again this does not seem  to be anything related to myeloma.   We will see what the orthopedic spine doctor says.  We will give some more IV fluids today. I will give another dose of Toradol and Valium.  We will plan to get him back to see [place] in another month.   Volanda Napoleon, MD 9/1/201512:19 PM

## 2013-10-24 NOTE — Progress Notes (Signed)
A lot of charting and charges were under the injection encounter which was removed and an infusion encounter added.

## 2013-10-24 NOTE — Telephone Encounter (Signed)
Per Rn to [facility] inj and sch an infus 1hour

## 2013-10-24 NOTE — Patient Instructions (Signed)
Influenza Virus Vaccine injection (Fluarix) What is this medicine? INFLUENZA VIRUS VACCINE (in floo EN zuh VAHY ruhs vak SEEN) helps to reduce the risk of getting influenza also known as the flu. This medicine may be used for other purposes; ask your health care provider or pharmacist if you have questions. COMMON BRAND NAME(S): Fluarix, Fluzone What should I tell my health care provider before I take this medicine? They need to know if you have any of these conditions: -bleeding disorder like hemophilia -fever or infection -Guillain-Barre syndrome or other neurological problems -immune system problems -infection with the human immunodeficiency virus (HIV) or AIDS -low blood platelet counts -multiple sclerosis -an unusual or allergic reaction to influenza virus vaccine, eggs, chicken proteins, latex, gentamicin, other medicines, foods, dyes or preservatives -pregnant or trying to get pregnant -breast-feeding How should I use this medicine? This vaccine is for injection into a muscle. It is given by a health care professional. A copy of Vaccine Information Statements will be given before each vaccination. Read this sheet carefully each time. The sheet may change frequently. Talk to your pediatrician regarding the use of this medicine in children. Special care may be needed. Overdosage: If you think you have taken too much of this medicine contact a poison control center or emergency room at once. NOTE: This medicine is only for you. Do not share this medicine with others. What if I miss a dose? This does not apply. What may interact with this medicine? -chemotherapy or radiation therapy -medicines that lower your immune system like etanercept, anakinra, infliximab, and adalimumab -medicines that treat or prevent blood clots like warfarin -phenytoin -steroid medicines like prednisone or cortisone -theophylline -vaccines This list may not describe all possible interactions. Give your  health care provider a list of all the medicines, herbs, non-prescription drugs, or dietary supplements you use. Also tell them if you smoke, drink alcohol, or use illegal drugs. Some items may interact with your medicine. What should I watch for while using this medicine? Report any side effects that do not go away within 3 days to your doctor or health care professional. Call your health care provider if any unusual symptoms occur within 6 weeks of receiving this vaccine. You may still catch the flu, but the illness is not usually as bad. You cannot get the flu from the vaccine. The vaccine will not protect against colds or other illnesses that may cause fever. The vaccine is needed every year. What side effects may I notice from receiving this medicine? Side effects that you should report to your doctor or health care professional as soon as possible: -allergic reactions like skin rash, itching or hives, swelling of the face, lips, or tongue Side effects that usually do not require medical attention (report to your doctor or health care professional if they continue or are bothersome): -fever -headache -muscle aches and pains -pain, tenderness, redness, or swelling at site where injected -weak or tired This list may not describe all possible side effects. Call your doctor for medical advice about side effects. You may report side effects to FDA at 1-800-FDA-1088. Where should I keep my medicine? This vaccine is only given in a clinic, pharmacy, doctor's office, or other health care setting and will not be stored at home. NOTE: This sheet is a summary. It may not cover all possible information. If you have questions about this medicine, talk to your doctor, pharmacist, or health care provider.  2015, [employer] Standard. (2007-09-07 09:30:40) Testosterone injection What is this  medicine? TESTOSTERONE (tes TOS ter one) is the main male hormone. It supports normal male development such as  muscle growth, facial hair, and deep voice. It is used in males to treat low testosterone levels. This medicine may be used for other purposes; ask your health care provider or pharmacist if you have questions. COMMON BRAND NAME(S): Andro-L.A., Aveed, Delatestryl, Depo-Testosterone, Virilon What should I tell my health care provider before I take this medicine? They need to know if you have any of these conditions: -breast cancer -diabetes -heart disease -kidney disease -liver disease -lung disease -prostate cancer, enlargement -an unusual or allergic reaction to testosterone, other medicines, foods, dyes, or preservatives -pregnant or trying to get pregnant -breast-feeding How should I use this medicine? This medicine is for injection into a muscle. It is usually given by a health care professional in a hospital or clinic setting. Contact your pediatrician regarding the use of this medicine in children. While this medicine may be prescribed for children as young as 49 years of age for selected conditions, precautions do apply. Overdosage: If you think you have taken too much of this medicine contact a poison control center or emergency room at once. NOTE: This medicine is only for you. Do not share this medicine with others. What if I miss a dose? Try not to miss a dose. Your doctor or health care professional will tell you when your next injection is due. Notify the office if you are unable to keep an appointment. What may interact with this medicine? -medicines for diabetes -medicines that treat or prevent blood clots like warfarin -oxyphenbutazone -propranolol -steroid medicines like prednisone or cortisone This list may not describe all possible interactions. Give your health care provider a list of all the medicines, herbs, non-prescription drugs, or dietary supplements you use. Also tell them if you smoke, drink alcohol, or use illegal drugs. Some items may interact with your  medicine. What should I watch for while using this medicine? Visit your doctor or health care professional for regular checks on your progress. They will need to check the level of testosterone in your blood. This medicine is only approved for use in men who have low levels of testosterone related to certain medical conditions. Heart attacks and strokes have been reported with the use of this medicine. Notify your doctor or health care professional and seek emergency treatment if you develop breathing problems; changes in vision; confusion; chest pain or chest tightness; sudden arm pain; severe, sudden headache; trouble speaking or understanding; sudden numbness or weakness of the face, arm or leg; loss of balance or coordination. Talk to your doctor about the risks and benefits of this medicine. This medicine may affect blood sugar levels. If you have diabetes, check with your doctor or health care professional before you change your diet or the dose of your diabetic medicine. This drug is banned from use in athletes by most athletic organizations. What side effects may I notice from receiving this medicine? Side effects that you should report to your doctor or health care professional as soon as possible: -allergic reactions like skin rash, itching or hives, swelling of the face, lips, or tongue -breast enlargement -breathing problems -changes in mood, especially anger, depression, or rage -dark urine -general ill feeling or flu-like symptoms -light-colored stools -loss of appetite, nausea -nausea, vomiting -right upper belly pain -stomach pain -swelling of ankles -too frequent or persistent erections -trouble passing urine or change in the amount of urine -unusually weak or tired -yellowing  of the eyes or skin Additional side effects that can occur in women include: -deep or hoarse voice -facial hair growth -irregular menstrual periods Side effects that usually do not require medical  attention (report to your doctor or health care professional if they continue or are bothersome): -acne -change in sex drive or performance -hair loss -headache This list may not describe all possible side effects. Call your doctor for medical advice about side effects. You may report side effects to FDA at 1-800-FDA-1088. Where should I keep my medicine? Keep out of the reach of children. This medicine can be abused. Keep your medicine in a safe place to protect it from theft. Do not share this medicine with anyone. Selling or giving away this medicine is dangerous and against the law. Store at room temperature between 20 and 25 degrees C (68 and 77 degrees F). Do not freeze. Protect from light. Follow the directions for the product you are prescribed. Throw away any unused medicine after the expiration date. NOTE: This sheet is a summary. It may not cover all possible information. If you have questions about this medicine, talk to your doctor, pharmacist, or health care provider.  2015, [employer] Standard. (2013-04-27 [id number])

## 2013-10-24 NOTE — Patient Instructions (Signed)
Testosterone injection What is this medicine? TESTOSTERONE (tes TOS ter one) is the main male hormone. It supports normal male development such as muscle growth, facial hair, and deep voice. It is used in males to treat low testosterone levels. This medicine may be used for other purposes; ask your health care provider or pharmacist if you have questions. COMMON BRAND NAME(S): Andro-L.A., Aveed, Delatestryl, Depo-Testosterone, Virilon What should I tell my health care provider before I take this medicine? They need to know if you have any of these conditions: -breast cancer -diabetes -heart disease -kidney disease -liver disease -lung disease -prostate cancer, enlargement -an unusual or allergic reaction to testosterone, other medicines, foods, dyes, or preservatives -pregnant or trying to get pregnant -breast-feeding How should I use this medicine? This medicine is for injection into a muscle. It is usually given by a health care professional in a hospital or clinic setting. Contact your pediatrician regarding the use of this medicine in children. While this medicine may be prescribed for children as young as 12 years of age for selected conditions, precautions do apply. Overdosage: If you think you have taken too much of this medicine contact a poison control center or emergency room at once. NOTE: This medicine is only for you. Do not share this medicine with others. What if I miss a dose? Try not to miss a dose. Your doctor or health care professional will tell you when your next injection is due. Notify the office if you are unable to keep an appointment. What may interact with this medicine? -medicines for diabetes -medicines that treat or prevent blood clots like warfarin -oxyphenbutazone -propranolol -steroid medicines like prednisone or cortisone This list may not describe all possible interactions. Give your health care provider a list of all the medicines, herbs,  non-prescription drugs, or dietary supplements you use. Also tell them if you smoke, drink alcohol, or use illegal drugs. Some items may interact with your medicine. What should I watch for while using this medicine? Visit your doctor or health care professional for regular checks on your progress. They will need to check the level of testosterone in your blood. This medicine is only approved for use in men who have low levels of testosterone related to certain medical conditions. Heart attacks and strokes have been reported with the use of this medicine. Notify your doctor or health care professional and seek emergency treatment if you develop breathing problems; changes in vision; confusion; chest pain or chest tightness; sudden arm pain; severe, sudden headache; trouble speaking or understanding; sudden numbness or weakness of the face, arm or leg; loss of balance or coordination. Talk to your doctor about the risks and benefits of this medicine. This medicine may affect blood sugar levels. If you have diabetes, check with your doctor or health care professional before you change your diet or the dose of your diabetic medicine. This drug is banned from use in athletes by most athletic organizations. What side effects may I notice from receiving this medicine? Side effects that you should report to your doctor or health care professional as soon as possible: -allergic reactions like skin rash, itching or hives, swelling of the face, lips, or tongue -breast enlargement -breathing problems -changes in mood, especially anger, depression, or rage -dark urine -general ill feeling or flu-like symptoms -light-colored stools -loss of appetite, nausea -nausea, vomiting -right upper belly pain -stomach pain -swelling of ankles -too frequent or persistent erections -trouble passing urine or change in the amount of urine -unusually   weak or tired -yellowing of the eyes or skin Additional side effects  that can occur in women include: -deep or hoarse voice -facial hair growth -irregular menstrual periods Side effects that usually do not require medical attention (report to your doctor or health care professional if they continue or are bothersome): -acne -change in sex drive or performance -hair loss -headache This list may not describe all possible side effects. Call your doctor for medical advice about side effects. You may report side effects to FDA at 1-800-FDA-1088. Where should I keep my medicine? Keep out of the reach of children. This medicine can be abused. Keep your medicine in a safe place to protect it from theft. Do not share this medicine with anyone. Selling or giving away this medicine is dangerous and against the law. Store at room temperature between 20 and 25 degrees C (68 and 77 degrees F). Do not freeze. Protect from light. Follow the directions for the product you are prescribed. Throw away any unused medicine after the expiration date. NOTE: This sheet is a summary. It may not cover all possible information. If you have questions about this medicine, talk to your doctor, pharmacist, or health care provider.  2015, [employer] Standard. (2013-04-27 [id number]) Influenza Virus Vaccine injection (Fluarix) What is this medicine? INFLUENZA VIRUS VACCINE (in floo EN zuh VAHY ruhs vak SEEN) helps to reduce the risk of getting influenza also known as the flu. This medicine may be used for other purposes; ask your health care provider or pharmacist if you have questions. COMMON BRAND NAME(S): Fluarix, Fluzone What should I tell my health care provider before I take this medicine? They need to know if you have any of these conditions: -bleeding disorder like hemophilia -fever or infection -Guillain-Barre syndrome or other neurological problems -immune system problems -infection with the human immunodeficiency virus (HIV) or AIDS -low blood platelet counts -multiple  sclerosis -an unusual or allergic reaction to influenza virus vaccine, eggs, chicken proteins, latex, gentamicin, other medicines, foods, dyes or preservatives -pregnant or trying to get pregnant -breast-feeding How should I use this medicine? This vaccine is for injection into a muscle. It is given by a health care professional. A copy of Vaccine Information Statements will be given before each vaccination. Read this sheet carefully each time. The sheet may change frequently. Talk to your pediatrician regarding the use of this medicine in children. Special care may be needed. Overdosage: If you think you have taken too much of this medicine contact a poison control center or emergency room at once. NOTE: This medicine is only for you. Do not share this medicine with others. What if I miss a dose? This does not apply. What may interact with this medicine? -chemotherapy or radiation therapy -medicines that lower your immune system like etanercept, anakinra, infliximab, and adalimumab -medicines that treat or prevent blood clots like warfarin -phenytoin -steroid medicines like prednisone or cortisone -theophylline -vaccines This list may not describe all possible interactions. Give your health care provider a list of all the medicines, herbs, non-prescription drugs, or dietary supplements you use. Also tell them if you smoke, drink alcohol, or use illegal drugs. Some items may interact with your medicine. What should I watch for while using this medicine? Report any side effects that do not go away within 3 days to your doctor or health care professional. Call your health care provider if any unusual symptoms occur within 6 weeks of receiving this vaccine. You may still catch the flu, but the illness  is not usually as bad. You cannot get the flu from the vaccine. The vaccine will not protect against colds or other illnesses that may cause fever. The vaccine is needed every year. What side  effects may I notice from receiving this medicine? Side effects that you should report to your doctor or health care professional as soon as possible: -allergic reactions like skin rash, itching or hives, swelling of the face, lips, or tongue Side effects that usually do not require medical attention (report to your doctor or health care professional if they continue or are bothersome): -fever -headache -muscle aches and pains -pain, tenderness, redness, or swelling at site where injected -weak or tired This list may not describe all possible side effects. Call your doctor for medical advice about side effects. You may report side effects to FDA at 1-800-FDA-1088. Where should I keep my medicine? This vaccine is only given in a clinic, pharmacy, doctor's office, or other health care setting and will not be stored at home. NOTE: This sheet is a summary. It may not cover all possible information. If you have questions about this medicine, talk to your doctor, pharmacist, or health care provider.  2015, [employer] Standard. (2007-09-07 09:30:40)

## 2013-11-05 ENCOUNTER — Emergency Department (HOSPITAL_COMMUNITY): Payer: Medicaid Other

## 2013-11-05 ENCOUNTER — Emergency Department (HOSPITAL_COMMUNITY)
Admission: EM | Admit: 2013-11-05 | Discharge: 2013-11-05 | Disposition: A | Discharge status: Home / Self Care | Payer: Medicaid Other | Attending: Emergency Medicine | Admitting: Emergency Medicine

## 2013-11-05 ENCOUNTER — Encounter (HOSPITAL_COMMUNITY): Payer: Self-pay | Admitting: Emergency Medicine

## 2013-11-05 DIAGNOSIS — M545 Low back pain, unspecified: Secondary | ICD-10-CM | POA: Diagnosis not present

## 2013-11-05 DIAGNOSIS — Z791 Long term (current) use of non-steroidal anti-inflammatories (NSAID): Secondary | ICD-10-CM | POA: Insufficient documentation

## 2013-11-05 DIAGNOSIS — R109 Unspecified abdominal pain: Secondary | ICD-10-CM | POA: Diagnosis present

## 2013-11-05 DIAGNOSIS — E1142 Type 2 diabetes mellitus with diabetic polyneuropathy: Secondary | ICD-10-CM | POA: Diagnosis not present

## 2013-11-05 DIAGNOSIS — Z862 Personal history of diseases of the blood and blood-forming organs and certain disorders involving the immune mechanism: Secondary | ICD-10-CM | POA: Insufficient documentation

## 2013-11-05 DIAGNOSIS — Z8619 Personal history of other infectious and parasitic diseases: Secondary | ICD-10-CM | POA: Diagnosis not present

## 2013-11-05 DIAGNOSIS — Z7982 Long term (current) use of aspirin: Secondary | ICD-10-CM | POA: Insufficient documentation

## 2013-11-05 DIAGNOSIS — I10 Essential (primary) hypertension: Secondary | ICD-10-CM | POA: Insufficient documentation

## 2013-11-05 DIAGNOSIS — R339 Retention of urine, unspecified: Secondary | ICD-10-CM | POA: Insufficient documentation

## 2013-11-05 DIAGNOSIS — F411 Generalized anxiety disorder: Secondary | ICD-10-CM | POA: Insufficient documentation

## 2013-11-05 DIAGNOSIS — E1149 Type 2 diabetes mellitus with other diabetic neurological complication: Secondary | ICD-10-CM | POA: Diagnosis not present

## 2013-11-05 DIAGNOSIS — E785 Hyperlipidemia, unspecified: Secondary | ICD-10-CM | POA: Insufficient documentation

## 2013-11-05 DIAGNOSIS — K5900 Constipation, unspecified: Secondary | ICD-10-CM | POA: Insufficient documentation

## 2013-11-05 DIAGNOSIS — C9000 Multiple myeloma not having achieved remission: Secondary | ICD-10-CM | POA: Insufficient documentation

## 2013-11-05 DIAGNOSIS — R350 Frequency of micturition: Secondary | ICD-10-CM | POA: Diagnosis not present

## 2013-11-05 DIAGNOSIS — Z8719 Personal history of other diseases of the digestive system: Secondary | ICD-10-CM | POA: Diagnosis not present

## 2013-11-05 DIAGNOSIS — R3915 Urgency of urination: Secondary | ICD-10-CM | POA: Diagnosis not present

## 2013-11-05 LAB — COMPREHENSIVE METABOLIC PANEL
ALT: 16 U/L (ref 0–53)
AST: 24 U/L (ref 0–37)
Albumin: 3.7 g/dL (ref 3.5–5.2)
Alkaline Phosphatase: 65 U/L (ref 39–117)
Anion gap: 12 (ref 5–15)
BUN: 35 mg/dL — ABNORMAL HIGH (ref 6–23)
CALCIUM: 9 mg/dL (ref 8.4–10.5)
CHLORIDE: 100 meq/L (ref 96–112)
CO2: 27 mEq/L (ref 19–32)
Creatinine, Ser: 2.5 mg/dL — ABNORMAL HIGH (ref 0.50–1.35)
GFR calc Af Amer: 30 mL/min — ABNORMAL LOW (ref >90)
GFR, EST NON AFRICAN AMERICAN: 26 mL/min — AB (ref >90)
GLUCOSE: 160 mg/dL — AB (ref 70–99)
Potassium: 4.1 mEq/L (ref 3.7–5.3)
SODIUM: 139 meq/L (ref 137–147)
Total Bilirubin: 0.3 mg/dL (ref 0.3–1.2)
Total Protein: 7.9 g/dL (ref 6.0–8.3)

## 2013-11-05 LAB — CBC
HCT: 33 % — ABNORMAL LOW (ref 39.0–52.0)
HEMOGLOBIN: 10.9 g/dL — AB (ref 13.0–17.0)
MCH: 25.6 pg — AB (ref 26.0–34.0)
MCHC: 33 g/dL (ref 30.0–36.0)
MCV: 77.5 fL — ABNORMAL LOW (ref 78.0–100.0)
PLATELETS: 274 10*3/uL (ref 150–400)
RBC: 4.26 MIL/uL (ref 4.22–5.81)
RDW: 16.2 % — ABNORMAL HIGH (ref 11.5–15.5)
WBC: 7.1 10*3/uL (ref 4.0–10.5)

## 2013-11-05 LAB — CBG MONITORING, ED: Glucose-Capillary: 135 mg/dL — ABNORMAL HIGH (ref 70–99)

## 2013-11-05 LAB — URINALYSIS, ROUTINE W REFLEX MICROSCOPIC
BILIRUBIN URINE: NEGATIVE
GLUCOSE, UA: NEGATIVE mg/dL
HGB URINE DIPSTICK: NEGATIVE
Ketones, ur: NEGATIVE mg/dL
Leukocytes, UA: NEGATIVE
Nitrite: NEGATIVE
PROTEIN: NEGATIVE mg/dL
Specific Gravity, Urine: 1.014 (ref 1.005–1.030)
UROBILINOGEN UA: 0.2 mg/dL (ref 0.0–1.0)
pH: 8 (ref 5.0–8.0)

## 2013-11-05 MED ORDER — SODIUM CHLORIDE 0.9 % IV BOLUS (SEPSIS)
1000.0000 mL | Freq: Once | INTRAVENOUS | Status: AC
  Administered 2013-11-05: 1000 mL via INTRAVENOUS

## 2013-11-05 MED ORDER — ONDANSETRON HCL 4 MG/2ML IJ SOLN
4.0000 mg | Freq: Once | INTRAMUSCULAR | Status: AC
  Administered 2013-11-05: 4 mg via INTRAVENOUS
  Filled 2013-11-05: qty 2

## 2013-11-05 MED ORDER — HYDROMORPHONE HCL PF 1 MG/ML IJ SOLN
1.0000 mg | Freq: Once | INTRAMUSCULAR | Status: AC
  Administered 2013-11-05: 1 mg via INTRAVENOUS
  Filled 2013-11-05: qty 1

## 2013-11-05 NOTE — ED Notes (Signed)
Md notified of pt pain reassessment and urinary retention.  Per MD ok to wait and not cath at this time.

## 2013-11-05 NOTE — ED Notes (Signed)
MD at bedside. 

## 2013-11-05 NOTE — ED Notes (Addendum)
Pt sts he is still unable to void at this time.

## 2013-11-05 NOTE — ED Notes (Signed)
Pt presents with NAD- Pt states has Cancer. Seen and treated in [facility]. Pt c/o of pain with urination, frequency and odor. For several months. Has been seen and treated for this complaint before. Pt c/o of bil flank pain. Denies nausea without vomiting no fever

## 2013-11-05 NOTE — Discharge Instructions (Signed)
Rest. Drink plenty of fluids.  Take your pain medication as need.  Follow up with your doctor in the next couple days for recheck. Return to ER if worse, new symptoms, fevers, numbness/weakness, other concern.  You were given pain medication in the ER - no driving for the next 4 hours.     Flank Pain Flank pain refers to pain that is located on the side of the body between the upper abdomen and the back. The pain may occur over a short period of time (acute) or may be long-term or reoccurring (chronic). It may be mild or severe. Flank pain can be caused by many things. CAUSES  Some of the more common causes of flank pain include:  Muscle strains.   Muscle spasms.   A disease of your spine (vertebral disk disease).   A lung infection (pneumonia).   Fluid around your lungs (pulmonary edema).   A kidney infection.   Kidney stones.   A very painful skin rash caused by the chickenpox virus (shingles).   Gallbladder disease.  [place]ouds care will depend on the cause of your pain. In general,  Rest as directed by your caregiver.  Drink enough fluids to keep your urine clear or pale yellow.  Only take over-the-counter or prescription medicines as directed by your caregiver. Some medicines may help relieve the pain.  Tell your caregiver about any changes in your pain.  Follow up with your caregiver as directed. SEEK IMMEDIATE MEDICAL CARE IF:   Your pain is not controlled with medicine.   You have new or worsening symptoms.  Your pain increases.   You have abdominal pain.   You have shortness of breath.   You have persistent nausea or vomiting.   You have swelling in your abdomen.   You feel faint or pass out.   You have blood in your urine.  You have a fever or persistent symptoms for more than 2-3 days.  You have a fever and your symptoms suddenly get worse. MAKE SURE YOU:   Understand these instructions.  Will watch  your condition.  Will get help right away if you are not doing well or get worse. Document Released: 04/02/2005 Document Revised: 11/04/2011 Document Reviewed: 09/24/2011 [facility] Patient Information 2015 [place], [place]. This information is not intended to replace advice given to you by your health care provider. Make sure you discuss any questions you have with your health care provider.    Back Pain, Adult Low back pain is very common. About 1 in 5 people have back pain.The cause of low back pain is rarely dangerous. The pain often gets better over time.About half of people with a sudden onset of back pain feel better in just 2 weeks. About 8 in 10 people feel better by 6 weeks.  CAUSES Some common causes of back pain include:  Strain of the muscles or ligaments supporting the spine.  Wear and tear (degeneration) of the spinal discs.  Arthritis.  Direct injury to the back. DIAGNOSIS Most of the time, the direct cause of low back pain is not known.However, back pain can be treated effectively even when the exact cause of the pain is unknown.Answering your caregiver's questions about your overall health and symptoms is one of the most accurate ways to make sure the cause of your pain is not dangerous. If your caregiver needs more information, he or she may order lab work or imaging tests (X-rays or MRIs).However, even if imaging tests  show changes in your back, this usually does not require surgery. HOME CARE INSTRUCTIONS For many people, back pain returns.Since low back pain is rarely dangerous, it is often a condition that people can learn to [facility] their own.   Remain active. It is stressful on the back to sit or stand in one place. Do not sit, drive, or stand in one place for more than 30 minutes at a time. Take short walks on level surfaces as soon as pain allows.Try to increase the length of time you walk each day.  Do not stay in bed.Resting more than 1 or 2 days can  delay your recovery.  Do not avoid exercise or work.Your body is made to move.It is not dangerous to be active, even though your back may hurt.Your back will likely heal faster if you return to being active before your pain is gone.  Pay attention to your body when you bend and lift. Many people have less discomfortwhen lifting if they bend their knees, keep the load close to their bodies,and avoid twisting. Often, the most comfortable positions are those that put less stress on your recovering back.  Find a comfortable position to sleep. Use a firm mattress and lie on your side with your knees slightly bent. If you lie on your back, put a pillow under your knees.  Only take over-the-counter or prescription medicines as directed by your caregiver. Over-the-counter medicines to reduce pain and inflammation are often the most helpful.Your caregiver may prescribe muscle relaxant drugs.These medicines help dull your pain so you can more quickly return to your normal activities and healthy exercise.  Put ice on the injured area.  Put ice in a plastic bag.  Place a towel between your skin and the bag.  Leave the ice on for 15-20 minutes, 03-04 times a day for the first 2 to 3 days. After that, ice and heat may be alternated to reduce pain and spasms.  Ask your caregiver about trying back exercises and gentle massage. This may be of some benefit.  Avoid feeling anxious or stressed.Stress increases muscle tension and can worsen back pain.It is important to recognize when you are anxious or stressed and learn ways to manage it.Exercise is a great option. SEEK MEDICAL CARE IF:  You have pain that is not relieved with rest or medicine.  You have pain that does not improve in 1 week.  You have new symptoms.  You are generally not feeling well. SEEK IMMEDIATE MEDICAL CARE IF:   You have pain that radiates from your back into your legs.  You develop new bowel or bladder control  problems.  You have unusual weakness or numbness in your arms or legs.  You develop nausea or vomiting.  You develop abdominal pain.  You feel faint. Document Released: 02/09/2005 Document Revised: 08/11/2011 Document Reviewed: 06/13/2013 [facility] Patient Information 2015 [place], [place]. This information is not intended to replace advice given to you by your health care provider. Make sure you discuss any questions you have with your health care provider.

## 2013-11-05 NOTE — ED Notes (Signed)
Pt has cell phone and wallet with him at d/c home

## 2013-11-05 NOTE — ED Notes (Signed)
Bladder scan showed an average of 252ml in bladder.

## 2013-11-05 NOTE — ED Notes (Signed)
Last CBG 200's taken 2 days ago

## 2013-11-05 NOTE — ED Provider Notes (Signed)
CSN: [id number]     Arrival date & time 11/05/13  1046 History   First MD Initiated Contact with Patient 11/05/13 1125     Chief Complaint  Patient presents with  . Flank Pain  . Urinary Frequency  . Urinary Retention  . Cancer     (Consider location/radiation/quality/duration/timing/severity/associated sxs/prior Treatment) Patient is a 63 y.o. male presenting with flank pain and frequency. The history is provided by the patient.  Flank Pain Pertinent negatives include no chest pain, no abdominal pain, no headaches and no shortness of breath.  Urinary Frequency Pertinent negatives include no chest pain, no abdominal pain, no headaches and no shortness of breath.  pt with hx multiple myeloma, c/o low back pain/left flank pain in the past week. Constant. Dull. Non radiating. No radicular or leg pain. No perineal numbness or leg numbness/weakness. Urinary urgency. Denies dysuria or hematuria. No hx kidney stones. Denies fever or chills. Denies back injury or strain. No trauma or fall. Denies anterior/abd pain. States recently saw his oncologist for same, no specific dx made.      Past Medical History  Diagnosis Date  . Multiple myeloma   . Hypertension   . Renal insufficiency   . Diverticulosis   . Hemorrhoids   . Type 2 diabetes mellitus   . Anxiety disorder   . Urinary retention   . Anemia   . Diabetic neuropathy   . Constipation   . Herpes zoster   . Arthritis   . Hyperlipidemia   . Myeloma 12/31/2010  . Arthralgia of multiple joints 12/31/2010  . Anemia, iron deficiency 03/12/2011  . 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 Hypotestosteronism 03/17/2012   Past Surgical History  Procedure Laterality Date  . Knee arthroscopy      left  . Portacath placement    . Cataract extraction      bi-lateral  . Transurethral resection of prostate  01/2011    Dr. at [facility] in [place]  . Colonoscopy    . Esophagogastroduodenoscopy    . Colonoscopy  07/13/2011   Procedure: COLONOSCOPY;  Surgeon: Ladene Artist, MD,FACG;  Location: [facility] ENDOSCOPY;  Service: Endoscopy;  Laterality: N/A;   Family History  Problem Relation Age of Onset  . Breast cancer Sister   . Breast cancer Mother   . Diabetes Sister   . Bone cancer Father   . Colon cancer Neg Hx   . Heart disease Sister   . Heart disease Mother   . Heart disease Daughter   . Throat cancer Brother   . Stomach cancer Brother    History  Substance Use Topics  . Smoking status: Never Smoker   . Smokeless tobacco: Never Used     Comment: never used tobacco  . Alcohol Use: No    Review of Systems  Constitutional: Negative for fever and chills.  HENT: Negative for sore throat.   Eyes: Negative for redness.  Respiratory: Negative for cough and shortness of breath.   Cardiovascular: Negative for chest pain and leg swelling.  Gastrointestinal: Negative for vomiting, abdominal pain, diarrhea and constipation.  Genitourinary: Positive for urgency and flank pain. Negative for scrotal swelling and testicular pain.  Musculoskeletal: Positive for back pain. Negative for neck pain.  Skin: Negative for rash.  Neurological: Negative for weakness, numbness and headaches.  Hematological: Does not bruise/bleed easily.  Psychiatric/Behavioral: Negative for confusion.      Allergies  Iodine  Home Medications   Prior to Admission medications   Medication  Sig Start Date End Date Taking? Authorizing Provider  amLODipine (NORVASC) 10 MG tablet Take 10 mg by mouth daily.     Yes Historical Provider, MD  aspirin 81 MG tablet Take 81 mg by mouth daily.   Yes Historical Provider, MD  CARAFATE 1 GM/10ML suspension Take 1 g by mouth 4 (four) times daily.  07/28/11  Yes Historical Provider, MD  diazepam (VALIUM) 5 MG tablet Take 5 mg by mouth every 4 (four) hours as needed for anxiety.   Yes Historical Provider, MD  ergocalciferol (VITAMIN D2) 50000 UNITS capsule Take 50,000 Units by mouth once a week.     Yes  Historical Provider, MD  furosemide (LASIX) 20 MG tablet Take 20 mg by mouth daily.     Yes Historical Provider, MD  glipiZIDE (GLUCOTROL) 5 MG tablet Take 5 mg by mouth daily.    Yes Historical Provider, MD  hydrALAZINE (APRESOLINE) 25 MG tablet Take 25 mg by mouth 3 (three) times daily.   Yes Historical Provider, MD  Hypromellose (NATURAL BALANCE TEARS OP) Apply to eye every morning.   Yes Historical Provider, MD  meclizine (ANTIVERT) 50 MG tablet Take 0.5 tablets (25 mg total) by mouth 4 (four) times daily as needed for dizziness. 12/13/11  Yes Virgel Manifold, MD  meloxicam (MOBIC) 15 MG tablet Take 1 tablet (15 mg total) by mouth daily. 10/24/13  Yes Volanda Napoleon, MD  methocarbamol (ROBAXIN) 500 MG tablet Take 500 mg by mouth 2 (two) times daily as needed for muscle spasms.    Yes Historical Provider, MD  omeprazole (PRILOSEC) 20 MG capsule Take 1 capsule (20 mg total) by mouth daily. 08/30/13  Yes Ladene Artist, MD  oxyCODONE (OXYCONTIN) 80 MG 12 hr tablet Take 80 mg by mouth 3 (three) times daily.   Yes Historical Provider, MD  oxyCODONE-acetaminophen (PERCOCET) 10-325 MG per tablet Take 1 tablet by mouth every 6 (six) hours as needed. Pain   Yes Historical Provider, MD  polyethylene glycol (MIRALAX / GLYCOLAX) packet Take 17 g by mouth daily.   Yes Historical Provider, MD  potassium chloride SA (K-DUR,KLOR-CON) 20 MEQ tablet Take 20 mEq by mouth as needed (takes with furosemide).    Yes Historical Provider, MD  PRESCRIPTION MEDICATION every 30 (thirty) days.   Yes Historical Provider, MD  Tamsulosin HCl (FLOMAX) 0.4 MG CAPS Take 0.4 mg by mouth daily.    Yes Historical Provider, MD  triamterene-hydrochlorothiazide (MAXZIDE-25) 37.5-25 MG per tablet Take 1 tablet by mouth daily.     Yes Historical Provider, MD   BP 167/71  Pulse 82  Temp(Src) 98.3 F (36.8 C) (Oral)  Resp 16  Ht '5\' 11"'  (1.803 m)  Wt 197 lb (89.359 kg)  BMI 27.49 kg/m2  SpO2 100% Physical Exam  Nursing note and vitals  reviewed. Constitutional: He is oriented to person, place, and time. He appears well-developed and well-nourished. No distress.  HENT:  Mouth/Throat: Oropharynx is clear and moist.  Eyes: Conjunctivae are normal. No scleral icterus.  Neck: Neck supple. No tracheal deviation present.  Cardiovascular: Normal rate, regular rhythm, normal heart sounds and intact distal pulses.   Pulmonary/Chest: Effort normal and breath sounds normal. No accessory muscle usage. No respiratory distress.  Abdominal: Soft. Bowel sounds are normal. He exhibits no distension and no mass. There is no tenderness. There is no rebound and no guarding.  Genitourinary:  No cva tenderness. No scrotal or testicular pain or tenderness.   Musculoskeletal: Normal range of motion. He exhibits no  edema and no tenderness.  tls spine non tender, aligned.   Neurological: He is alert and oriented to person, place, and time.  Skin: Skin is warm and dry. No rash noted.  No shingles/rash in area of pain  Psychiatric: He has a normal mood and affect.    ED Course  Procedures (including critical care time) Labs Review  Results for orders placed during the hospital encounter of 11/05/13  CBC      Result Value Ref Range   WBC 7.1  4.0 - 10.5 K/uL   RBC 4.26  4.22 - 5.81 MIL/uL   Hemoglobin 10.9 (*) 13.0 - 17.0 g/dL   HCT 33.0 (*) 39.0 - 52.0 %   MCV 77.5 (*) 78.0 - 100.0 fL   MCH 25.6 (*) 26.0 - 34.0 pg   MCHC 33.0  30.0 - 36.0 g/dL   RDW 16.2 (*) 11.5 - 15.5 %   Platelets 274  150 - 400 K/uL  COMPREHENSIVE METABOLIC PANEL      Result Value Ref Range   Sodium 139  137 - 147 mEq/L   Potassium 4.1  3.7 - 5.3 mEq/L   Chloride 100  96 - 112 mEq/L   CO2 27  19 - 32 mEq/L   Glucose, Bld 160 (*) 70 - 99 mg/dL   BUN 35 (*) 6 - 23 mg/dL   Creatinine, Ser 2.50 (*) 0.50 - 1.35 mg/dL   Calcium 9.0  8.4 - 10.5 mg/dL   Total Protein 7.9  6.0 - 8.3 g/dL   Albumin 3.7  3.5 - 5.2 g/dL   AST 24  0 - 37 U/L   ALT 16  0 - 53 U/L    Alkaline Phosphatase 65  39 - 117 U/L   Total Bilirubin 0.3  0.3 - 1.2 mg/dL   GFR calc non Af Amer 26 (*) >90 mL/min   GFR calc Af Amer 30 (*) >90 mL/min   Anion gap 12  5 - 15  URINALYSIS, ROUTINE W REFLEX MICROSCOPIC      Result Value Ref Range   Color, Urine YELLOW  YELLOW   APPearance CLEAR  CLEAR   Specific Gravity, Urine 1.014  1.005 - 1.030   pH 8.0  5.0 - 8.0   Glucose, UA NEGATIVE  NEGATIVE mg/dL   Hgb urine dipstick NEGATIVE  NEGATIVE   Bilirubin Urine NEGATIVE  NEGATIVE   Ketones, ur NEGATIVE  NEGATIVE mg/dL   Protein, ur NEGATIVE  NEGATIVE mg/dL   Urobilinogen, UA 0.2  0.0 - 1.0 mg/dL   Nitrite NEGATIVE  NEGATIVE   Leukocytes, UA NEGATIVE  NEGATIVE  CBG MONITORING, ED      Result Value Ref Range   Glucose-Capillary 135 (*) 70 - 99 mg/dL   Comment 1 Notify RN     Comment 2 Documented in Chart     Ct Abdomen Pelvis Wo Contrast  11/05/2013   CLINICAL DATA:  63 year old male with bilateral flank, abdominal and pelvic pain. History of multiple myeloma.  EXAM: CT ABDOMEN AND PELVIS WITHOUT CONTRAST  TECHNIQUE: Multidetector CT imaging of the abdomen and pelvis was performed following the standard protocol without IV contrast.  COMPARISON:  None.  FINDINGS: Cardiomegaly noted.  The liver, spleen, pancreas, adrenal glands and gallbladder are unremarkable.  Mild perinephric inflammation is of uncertain chronicity. There is no evidence hydronephrosis or urinary calculi. A probable left renal cysts noted.  Please note that parenchymal abnormalities may be missed without intravenous contrast.  The bowel, bladder and appendix  are unremarkable. There is no evidence of bowel obstruction, abscess or pneumoperitoneum.  There is no evidence of free fluid, enlarged lymph nodes, biliary dilation or abdominal aortic aneurysm.  Mild prostate enlargement noted.  No acute or suspicious bony abnormalities identified. Partial SI joint fusion noted.  IMPRESSION: Mild perinephric inflammation of uncertain  chronicity but may be chronic/remote. No other significant renal abnormalities.  No other acute abnormalities identified.  Cardiomegaly.   Electronically Signed   By: Hassan Rowan M.D.   On: 11/05/2013 15:57       MDM  Iv ns. Dilaudid 1 mg iv.  Labs. Ct.  Bladder scan w only 200 cc.  ua negative, pt feels able to empty bladder.   Reviewed nursing notes and prior charts for additional history.   Recheck pain improved but persists. Dilaudid 1 mg iv.  Recheck abd soft nt.   Pt had reported mri back in past month - it is listed under other MR number - mri w mild deg changes, however no acute process or signif ddd/nerve impingement noted.   Given flank pain, awaiting ct r/o ureteral stone.  Ct neg acute. Pt afeb. abd soft nt. Spine nt. Pt appears stable for d/c.    Mirna Mires, MD 11/06/13 [phone]

## 2013-11-05 NOTE — ED Notes (Signed)
Pt drank 8 oz water.  Additional cup given.

## 2013-11-08 ENCOUNTER — Encounter (HOSPITAL_COMMUNITY): Payer: Self-pay | Admitting: Emergency Medicine

## 2013-11-08 ENCOUNTER — Ambulatory Visit: Payer: Medicaid Other

## 2013-11-22 ENCOUNTER — Ambulatory Visit (HOSPITAL_BASED_OUTPATIENT_CLINIC_OR_DEPARTMENT_OTHER): Payer: Medicaid Other

## 2013-11-22 ENCOUNTER — Ambulatory Visit (HOSPITAL_BASED_OUTPATIENT_CLINIC_OR_DEPARTMENT_OTHER): Payer: Medicaid Other | Admitting: Hematology & Oncology

## 2013-11-22 ENCOUNTER — Ambulatory Visit (HOSPITAL_BASED_OUTPATIENT_CLINIC_OR_DEPARTMENT_OTHER): Payer: Medicaid Other | Admitting: Lab

## 2013-11-22 ENCOUNTER — Encounter: Payer: Self-pay | Admitting: Hematology & Oncology

## 2013-11-22 VITALS — BP 110/51 | HR 73 | Temp 98.0°F | Resp 18 | Ht 69.0 in | Wt 204.0 lb

## 2013-11-22 DIAGNOSIS — C9000 Multiple myeloma not having achieved remission: Secondary | ICD-10-CM

## 2013-11-22 DIAGNOSIS — M2550 Pain in unspecified joint: Secondary | ICD-10-CM

## 2013-11-22 DIAGNOSIS — E349 Endocrine disorder, unspecified: Secondary | ICD-10-CM

## 2013-11-22 DIAGNOSIS — IMO0001 Reserved for inherently not codable concepts without codable children: Secondary | ICD-10-CM

## 2013-11-22 DIAGNOSIS — E291 Testicular hypofunction: Secondary | ICD-10-CM

## 2013-11-22 DIAGNOSIS — D509 Iron deficiency anemia, unspecified: Secondary | ICD-10-CM

## 2013-11-22 DIAGNOSIS — E119 Type 2 diabetes mellitus without complications: Secondary | ICD-10-CM

## 2013-11-22 DIAGNOSIS — D649 Anemia, unspecified: Secondary | ICD-10-CM

## 2013-11-22 DIAGNOSIS — M545 Low back pain: Secondary | ICD-10-CM

## 2013-11-22 LAB — CMP (CANCER CENTER ONLY)
ALBUMIN: 3.6 g/dL (ref 3.3–5.5)
ALT: 21 U/L (ref 10–47)
AST: 28 U/L (ref 11–38)
Alkaline Phosphatase: 56 U/L (ref 26–84)
BUN, Bld: 26 mg/dL — ABNORMAL HIGH (ref 7–22)
CALCIUM: 9.3 mg/dL (ref 8.0–10.3)
CHLORIDE: 102 meq/L (ref 98–108)
CO2: 27 mEq/L (ref 18–33)
Creat: 2.3 mg/dl — ABNORMAL HIGH (ref 0.6–1.2)
Glucose, Bld: 111 mg/dL (ref 73–118)
POTASSIUM: 3.4 meq/L (ref 3.3–4.7)
SODIUM: 142 meq/L (ref 128–145)
TOTAL PROTEIN: 8 g/dL (ref 6.4–8.1)
Total Bilirubin: 0.5 mg/dl (ref 0.20–1.60)

## 2013-11-22 LAB — CBC WITH DIFFERENTIAL (CANCER CENTER ONLY)
BASO#: 0.1 10*3/uL (ref 0.0–0.2)
BASO%: 1.1 % (ref 0.0–2.0)
EOS%: 5.8 % (ref 0.0–7.0)
Eosinophils Absolute: 0.4 10*3/uL (ref 0.0–0.5)
HEMATOCRIT: 32.2 % — AB (ref 38.7–49.9)
HGB: 10.7 g/dL — ABNORMAL LOW (ref 13.0–17.1)
LYMPH#: 2.6 10*3/uL (ref 0.9–3.3)
LYMPH%: 41.6 % (ref 14.0–48.0)
MCH: 26.2 pg — AB (ref 28.0–33.4)
MCHC: 33.2 g/dL (ref 32.0–35.9)
MCV: 79 fL — AB (ref 82–98)
MONO#: 0.7 10*3/uL (ref 0.1–0.9)
MONO%: 11.6 % (ref 0.0–13.0)
NEUT#: 2.5 10*3/uL (ref 1.5–6.5)
NEUT%: 39.9 % — AB (ref 40.0–80.0)
PLATELETS: 213 10*3/uL (ref 145–400)
RBC: 4.09 10*6/uL — ABNORMAL LOW (ref 4.20–5.70)
RDW: 15.3 % (ref 11.1–15.7)
WBC: 6.2 10*3/uL (ref 4.0–10.0)

## 2013-11-22 MED ORDER — SODIUM CHLORIDE 0.9 % IJ SOLN
10.0000 mL | INTRAMUSCULAR | Status: DC | PRN
  Administered 2013-11-22: 10 mL via INTRAVENOUS
  Filled 2013-11-22: qty 10

## 2013-11-22 MED ORDER — HEPARIN SOD (PORK) LOCK FLUSH 100 UNIT/ML IV SOLN
500.0000 [IU] | Freq: Once | INTRAVENOUS | Status: AC
  Administered 2013-11-22: 500 [IU] via INTRAVENOUS
  Filled 2013-11-22: qty 5

## 2013-11-22 MED ORDER — SODIUM CHLORIDE 0.9 % IV SOLN
60.0000 mg | Freq: Once | INTRAVENOUS | Status: DC
  Filled 2013-11-22: qty 20

## 2013-11-22 MED ORDER — TESTOSTERONE CYPIONATE 200 MG/ML IM SOLN
300.0000 mg | INTRAMUSCULAR | Status: DC
  Administered 2013-11-22: 300 mg via INTRAMUSCULAR

## 2013-11-22 MED ORDER — TESTOSTERONE CYPIONATE 200 MG/ML IM SOLN
INTRAMUSCULAR | Status: AC
  Filled 2013-11-22: qty 2

## 2013-11-22 MED ORDER — SODIUM CHLORIDE 0.9 % IV SOLN: 60.0000 mg | Freq: Once | INTRAVENOUS | Status: DC

## 2013-11-22 MED ORDER — SODIUM CHLORIDE 0.9 % IV SOLN
60.0000 mg | Freq: Once | INTRAVENOUS | Status: AC
  Administered 2013-11-22: 60 mg via INTRAVENOUS
  Filled 2013-11-22: qty 6.67

## 2013-11-22 MED ORDER — DARBEPOETIN ALFA-POLYSORBATE 300 MCG/0.6ML IJ SOLN
300.0000 ug | Freq: Once | INTRAMUSCULAR | Status: AC
  Administered 2013-11-22: 300 ug via SUBCUTANEOUS

## 2013-11-22 MED ORDER — SODIUM CHLORIDE 0.9 % IV SOLN
INTRAVENOUS | Status: DC
  Administered 2013-11-22: 09:00:00 via INTRAVENOUS

## 2013-11-22 MED ORDER — DARBEPOETIN ALFA-POLYSORBATE 300 MCG/0.6ML IJ SOLN
INTRAMUSCULAR | Status: AC
  Filled 2013-11-22: qty 0.6

## 2013-11-22 NOTE — Patient Instructions (Signed)
Pamidronate injection What is this medicine? PAMIDRONATE (pa mi DROE nate) slows calcium loss from bones. It is used to treat high calcium blood levels from cancer or Paget's disease. It is also used to treat bone pain and prevent fractures from certain cancers that have spread to the bone. This medicine may be used for other purposes; ask your health care provider or pharmacist if you have questions. COMMON BRAND NAME(S): Aredia What should I tell my health care provider before I take this medicine? They need to know if you have any of these conditions: -aspirin-sensitive asthma -dental disease -kidney disease -an unusual or allergic reaction to pamidronate, other medicines, foods, dyes, or preservatives -pregnant or trying to get pregnant -breast-feeding How should I use this medicine? This medicine is for infusion into a vein. It is given by a health care professional in a hospital or clinic setting. Talk to your pediatrician regarding the use of this medicine in children. This medicine is not approved for use in children. Overdosage: If you think you have taken too much of this medicine contact a poison control center or emergency room at once. NOTE: This medicine is only for you. Do not share this medicine with others. What if I miss a dose? This does not apply. What may interact with this medicine? -certain antibiotics given by injection -medicines for inflammation or pain like ibuprofen, naproxen -some diuretics like bumetanide, furosemide -cyclosporine -parathyroid hormone -tacrolimus -teriparatide -thalidomide This list may not describe all possible interactions. Give your health care provider a list of all the medicines, herbs, non-prescription drugs, or dietary supplements you use. Also tell them if you smoke, drink alcohol, or use illegal drugs. Some items may interact with your medicine. What should I watch for while using this medicine? Visit your doctor or health care  professional for regular checkups. It may be some time before you see the benefit from this medicine. Do not stop taking your medicine unless your doctor tells you to. Your doctor may order blood tests or other tests to see how you are doing. Women should inform their doctor if they wish to become pregnant or think they might be pregnant. There is a potential for serious side effects to an unborn child. Talk to your health care professional or pharmacist for more information. You should make sure that you get enough calcium and vitamin D while you are taking this medicine. Discuss the foods you eat and the vitamins you take with your health care professional. Some people who take this medicine have severe bone, joint, and/or muscle pain. This medicine may also increase your risk for a broken thigh bone. Tell your doctor right away if you have pain in your upper leg or groin. Tell your doctor if you have any pain that does not go away or that gets worse. What side effects may I notice from receiving this medicine? Side effects that you should report to your doctor or health care professional as soon as possible: -allergic reactions like skin rash, itching or hives, swelling of the face, lips, or tongue -black or tarry stools -changes in vision -eye inflammation, pain -high blood pressure -jaw pain, especially burning or cramping -muscle weakness -numb, tingling pain -swelling of feet or hands -trouble passing urine or change in the amount of urine -unable to move easily Side effects that usually do not require medical attention (report to your doctor or health care professional if they continue or are bothersome): -bone, joint, or muscle pain -constipation -dizzy, drowsy -  fever -headache -loss of appetite -nausea, vomiting -pain at site where injected This list may not describe all possible side effects. Call your doctor for medical advice about side effects. You may report side effects to  FDA at 1-800-FDA-1088. Where should I keep my medicine? This drug is given in a hospital or clinic and will not be stored at home. NOTE: This sheet is a summary. It may not cover all possible information. If you have questions about this medicine, talk to your doctor, pharmacist, or health care provider.  2014, Elsevier/Gold Standard. (2010-08-08 08:49:49) Darbepoetin Alfa injection What is this medicine? DARBEPOETIN ALFA (dar be POE e tin AL fa) helps your body make more red blood cells. It is used to treat anemia caused by chronic kidney failure and chemotherapy. This medicine may be used for other purposes; ask your health care provider or pharmacist if you have questions. COMMON BRAND NAME(S): Aranesp What should I tell my health care provider before I take this medicine? They need to know if you have any of these conditions: -blood clotting disorders or history of blood clots -cancer patient not on chemotherapy -cystic fibrosis -heart disease, such as angina, heart failure, or a history of a heart attack -hemoglobin level of 12 g/dL or greater -high blood pressure -low levels of folate, iron, or vitamin B12 -seizures -an unusual or allergic reaction to darbepoetin, erythropoietin, albumin, hamster proteins, latex, other medicines, foods, dyes, or preservatives -pregnant or trying to get pregnant -breast-feeding How should I use this medicine? This medicine is for injection into a vein or under the skin. It is usually given by a health care professional in a hospital or clinic setting. If you get this medicine at home, you will be taught how to prepare and give this medicine. Do not shake the solution before you withdraw a dose. Use exactly as directed. Take your medicine at regular intervals. Do not take your medicine more often than directed. It is important that you put your used needles and syringes in a special sharps container. Do not put them in a trash can. If you do not have a  sharps container, call your pharmacist or healthcare provider to get one. Talk to your pediatrician regarding the use of this medicine in children. While this medicine may be used in children as young as 1 year for selected conditions, precautions do apply. Overdosage: If you think you have taken too much of this medicine contact a poison control center or emergency room at once. NOTE: This medicine is only for you. Do not share this medicine with others. What if I miss a dose? If you miss a dose, take it as soon as you can. If it is almost time for your next dose, take only that dose. Do not take double or extra doses. What may interact with this medicine? Do not take this medicine with any of the following medications: -epoetin alfa This list may not describe all possible interactions. Give your health care provider a list of all the medicines, herbs, non-prescription drugs, or dietary supplements you use. Also tell them if you smoke, drink alcohol, or use illegal drugs. Some items may interact with your medicine. What should I watch for while using this medicine? Visit your prescriber or health care professional for regular checks on your progress and for the needed blood tests and blood pressure measurements. It is especially important for the doctor to make sure your hemoglobin level is in the desired range, to limit the risk  of potential side effects and to give you the best benefit. Keep all appointments for any recommended tests. Check your blood pressure as directed. Ask your doctor what your blood pressure should be and when you should contact him or her. As your body makes more red blood cells, you may need to take iron, folic acid, or vitamin B supplements. Ask your doctor or health care provider which products are right for you. If you have kidney disease continue dietary restrictions, even though this medication can make you feel better. Talk with your doctor or health care professional  about the foods you eat and the vitamins that you take. What side effects may I notice from receiving this medicine? Side effects that you should report to your doctor or health care professional as soon as possible: -allergic reactions like skin rash, itching or hives, swelling of the face, lips, or tongue -breathing problems -changes in vision -chest pain -confusion, trouble speaking or understanding -feeling faint or lightheaded, falls -high blood pressure -muscle aches or pains -pain, swelling, warmth in the leg -rapid weight gain -severe headaches -sudden numbness or weakness of the face, arm or leg -trouble walking, dizziness, loss of balance or coordination -seizures (convulsions) -swelling of the ankles, feet, hands -unusually weak or tired Side effects that usually do not require medical attention (report to your doctor or health care professional if they continue or are bothersome): -diarrhea -fever, chills (flu-like symptoms) -headaches -nausea, vomiting -redness, stinging, or swelling at site where injected This list may not describe all possible side effects. Call your doctor for medical advice about side effects. You may report side effects to FDA at 1-800-FDA-1088. Where should I keep my medicine? Keep out of the reach of children. Store in a refrigerator between 2 and 8 degrees C (36 and 46 degrees F). Do not freeze. Do not shake. Throw away any unused portion if using a single-dose vial. Throw away any unused medicine after the expiration date. NOTE: This sheet is a summary. It may not cover all possible information. If you have questions about this medicine, talk to your doctor, pharmacist, or health care provider.  2014, [employer]vier/Gold Standard. (2008-01-24 10:23:57) Testosterone injection What is this medicine? TESTOSTERONE (tes TOS ter one) is the main male hormone. It supports normal male development such as muscle growth, facial hair, and deep voice. It is used  in males to treat low testosterone levels. This medicine may be used for other purposes; ask your health care provider or pharmacist if you have questions. COMMON BRAND NAME(S): Andro-L.A., Aveed, Delatestryl, Depo-Testosterone, Virilon What should I tell my health care provider before I take this medicine? They need to know if you have any of these conditions: -breast cancer -diabetes -heart disease -kidney disease -liver disease -lung disease -prostate cancer, enlargement -an unusual or allergic reaction to testosterone, other medicines, foods, dyes, or preservatives -pregnant or trying to get pregnant -breast-feeding How should I use this medicine? This medicine is for injection into a muscle. It is usually given by a health care professional in a hospital or clinic setting. Contact your pediatrician regarding the use of this medicine in children. While this medicine may be prescribed for children as young as 12 years of age for selected conditions, precautions do apply. Overdosage: If you think you have taken too much of this medicine contact a poison control center or emergency room at once. NOTE: This medicine is only for you. Do not share this medicine with others. What if I miss a  dose? Try not to miss a dose. Your doctor or health care professional will tell you when your next injection is due. Notify the office if you are unable to keep an appointment. What may interact with this medicine? -medicines for diabetes -medicines that treat or prevent blood clots like warfarin -oxyphenbutazone -propranolol -steroid medicines like prednisone or cortisone This list may not describe all possible interactions. Give your health care provider a list of all the medicines, herbs, non-prescription drugs, or dietary supplements you use. Also tell them if you smoke, drink alcohol, or use illegal drugs. Some items may interact with your medicine. What should I watch for while using this  medicine? Visit your doctor or health care professional for regular checks on your progress. They will need to check the level of testosterone in your blood. This medicine may affect blood sugar levels. If you have diabetes, check with your doctor or health care professional before you change your diet or the dose of your diabetic medicine. This drug is banned from use in athletes by most athletic organizations. What side effects may I notice from receiving this medicine? Side effects that you should report to your doctor or health care professional as soon as possible: -allergic reactions like skin rash, itching or hives, swelling of the face, lips, or tongue -breast enlargement -breathing problems -changes in mood, especially anger, depression, or rage -dark urine -general ill feeling or flu-like symptoms -light-colored stools -loss of appetite, nausea -nausea, vomiting -right upper belly pain -stomach pain -swelling of ankles -too frequent or persistent erections -trouble passing urine or change in the amount of urine -unusually weak or tired -yellowing of the eyes or skin Additional side effects that can occur in women include: -deep or hoarse voice -facial hair growth -irregular menstrual periods Side effects that usually do not require medical attention (report to your doctor or health care professional if they continue or are bothersome): -acne -change in sex drive or performance -hair loss -headache This list may not describe all possible side effects. Call your doctor for medical advice about side effects. You may report side effects to FDA at 1-800-FDA-1088. Where should I keep my medicine? Keep out of the reach of children. This medicine can be abused. Keep your medicine in a safe place to protect it from theft. Do not share this medicine with anyone. Selling or giving away this medicine is dangerous and against the law. Store at room temperature between 20 and 25 degrees  C (68 and 77 degrees F). Do not freeze. Protect from light. Follow the directions for the product you are prescribed. Throw away any unused medicine after the expiration date. NOTE: This sheet is a summary. It may not cover all possible information. If you have questions about this medicine, talk to your doctor, pharmacist, or health care provider.  2014, Elsevier/Gold Standard. (2007-04-22 16:13:46)

## 2013-11-22 NOTE — Progress Notes (Signed)
Hematology and Oncology Follow Up Visit  Vermont Rebeck [id number] 06-29-1950 63 y.o. 11/22/2013   Principle Diagnosis:  1. IgG kappa myeloma. 2. Anemia secondary to renal insufficiency. 3. Intermittent iron-deficiency anemia. 4. Hypotestosteronemia  Current Therapy:   1. Aredia 60 mg IV q.2 months. 2. Aranesp 300 mcg subcu as needed for hemoglobin less than      11. 3. Depo-Testosterone 300 mg q.2 weeks. 4. IV iron as indicated.     Interim History:  Mr.  Novello is back for followup. He's on a little bit better. His back is not as bad. He is seeing an orthopedic surgeon.  He did have a CT of the of the abdomen and pelvis back in mid September. This did not show anything that looked suspicious.  There's been no fever. He's had no bleeding. He's had no cough.  His last myelo studies done back in mid August showed a monoclonal spike of 0.79 g/L. His IgG level was 2020 mg/dL. His I. chain was 5.9 mg/dL.  His iron studies. Iron saturation of 33%. His ferritin was 2400. A lot of the ferritin is an acute phase reactant.  His appetite is doing okay. He's had no nausea or vomiting. He's had no fever. He's had no change in bowel or bladder habits.   Medications: Current outpatient prescriptions:amLODipine (NORVASC) 10 MG tablet, Take 10 mg by mouth daily., Disp: , Rfl: ;  aspirin 81 MG tablet, Take 81 mg by mouth daily., Disp: , Rfl: ;  CARAFATE 1 GM/10ML suspension, Take 1 g by mouth 4 (four) times daily. , Disp: , Rfl: ;  diazepam (VALIUM) 5 MG tablet, Take 5 mg by mouth every 4 (four) hours as needed for anxiety., Disp: , Rfl:  ergocalciferol (VITAMIN D2) 50000 UNITS capsule, Take 50,000 Units by mouth once a week.  , Disp: , Rfl: ;  furosemide (LASIX) 20 MG tablet, Take 20 mg by mouth daily.  , Disp: , Rfl: ;  glipiZIDE (GLUCOTROL) 5 MG tablet, Take 5 mg by mouth daily before breakfast., Disp: , Rfl: ;  Hypromellose (NATURAL BALANCE TEARS OP), Apply to eye every morning., Disp: , Rfl:    meclizine (ANTIVERT) 50 MG tablet, Take 0.5 tablets (25 mg total) by mouth 4 (four) times daily as needed for dizziness., Disp: 30 tablet, Rfl: 0;  meloxicam (MOBIC) 15 MG tablet, Take 1 tablet (15 mg total) by mouth daily., Disp: 30 tablet, Rfl: 2;  methocarbamol (ROBAXIN) 500 MG tablet, Take 500 mg by mouth 2 (two) times daily as needed for muscle spasms. , Disp: , Rfl:  omeprazole (PRILOSEC) 20 MG capsule, Take 1 capsule (20 mg total) by mouth daily., Disp: 30 capsule, Rfl: 11;  OxyCODONE (OXYCONTIN) 80 mg T12A 12 hr tablet, Take 80 mg by mouth 3 (three) times daily., Disp: , Rfl: ;  oxyCODONE-acetaminophen (PERCOCET) 10-325 MG per tablet, Take 1 tablet by mouth every 6 (six) hours as needed. Pain, Disp: , Rfl: ;  polyethylene glycol (MIRALAX / GLYCOLAX) packet, Take 17 g by mouth daily., Disp: , Rfl:  Polyvinyl Alcohol-Povidone (CLEAR EYES ALL SEASONS) 5-6 MG/ML SOLN, Apply 2 drops to eye daily as needed (drye eyes.)., Disp: , Rfl: ;  potassium chloride SA (K-DUR,KLOR-CON) 20 MEQ tablet, Take 20 mEq by mouth as needed (takes with furosemide). , Disp: , Rfl: ;  sucralfate (CARAFATE) 1 GM/10ML suspension, Take 2 g by mouth 4 (four) times daily., Disp: , Rfl: ;  Tamsulosin HCl (FLOMAX) 0.4 MG CAPS, Take 0.4 mg by mouth  daily. , Disp: , Rfl:  triamterene-hydrochlorothiazide (MAXZIDE-25) 37.5-25 MG per tablet, Take 1 tablet by mouth daily.  , Disp: , Rfl: ;  Vitamin D, Ergocalciferol, (DRISDOL) 50000 UNITS CAPS capsule, Take 50,000 Units by mouth every 7 (seven) days., Disp: , Rfl: ;  [DISCONTINUED] potassium chloride (KLOR-CON) 20 MEQ packet, Take 20 mEq by mouth daily. , Disp: , Rfl:  No current facility-administered medications for this visit. Facility-Administered Medications Ordered in Other Visits: 0.9 %  sodium chloride infusion, , Intravenous, Continuous, Volanda Napoleon, MD, Last Rate: 20 mL/hr at 11/22/13 0924;  darbepoetin (ARANESP) injection 300 mcg, 300 mcg, Subcutaneous, Once, Volanda Napoleon,  MD;  pamidronate (AREDIA) 60 mg in sodium chloride 0.9 % 500 mL IVPB, 60 mg, Intravenous, Once, Volanda Napoleon, MD pamidronate (AREDIA) 60 mg in sodium chloride 0.9 % 500 mL IVPB, 60 mg, Intravenous, Once, Volanda Napoleon, MD;  testosterone cypionate (DEPOTESTOTERONE CYPIONATE) injection 300 mg, 300 mg, Intramuscular, Q14 Days, Volanda Napoleon, MD  Allergies:  Allergies  Allergen Reactions  . Iodine Rash  . Iodine Anxiety    Didn't feel right "instructed not to take per MD--something with his port"    Past Medical History, Surgical history, Social history, and Family History were reviewed and updated.  Review of Systems: As above  Physical Exam:  height is 5\' 9"  (1.753 m) and weight is 204 lb (92.534 kg). His oral temperature is 98 F (36.7 C). His blood pressure is 110/51 and his pulse is 73. His respiration is 18.   Lungs are clear. Cardiac exam rhythm. Abdomen soft. He has good bowel sounds. There is no fluid wave. There is no palpable liver or spleen tip. Exam shows some tenderness in the lumbar spine. Some paravertebral muscle spasms are noted. He has decreased range of motion of the lower back. Extremities shows no clubbing cyanosis or edema. He has decent strength in his legs. Skin exam no rashes. Neurological exam is nonfocal.  Lab Results  Component Value Date   WBC 6.2 11/22/2013   HGB 10.7* 11/22/2013   HCT 32.2* 11/22/2013   MCV 79* 11/22/2013   PLT 213 11/22/2013     Chemistry      Component Value Date/Time   NA 142 11/22/2013 0821   NA 139 11/05/2013 1217   K 3.4 11/22/2013 0821   K 4.1 11/05/2013 1217   CL 102 11/22/2013 0821   CL 100 11/05/2013 1217   CO2 27 11/22/2013 0821   CO2 27 11/05/2013 1217   BUN 26* 11/22/2013 0821   BUN 35* 11/05/2013 1217   CREATININE 2.3* 11/22/2013 0821   CREATININE 2.50* 11/05/2013 1217      Component Value Date/Time   CALCIUM 9.3 11/22/2013 0821   CALCIUM 9.0 11/05/2013 1217   ALKPHOS 56 11/22/2013 0821   ALKPHOS 65 11/05/2013 1217   AST  28 11/22/2013 0821   AST 24 11/05/2013 1217   ALT 21 11/22/2013 0821   ALT 16 11/05/2013 1217   BILITOT 0.50 11/22/2013 0821   BILITOT 0.3 11/05/2013 1217         Impression and Plan: Mr. Shaheen is a 63 year old gentleman. He has multiple medical problems. I still do not think that myeloma is an issue. He has fibromyalgia. He has diabetes. He has low testosterone.  I don't think he needs iron today. His hemoglobin is going down. I will give him a dose of Aranesp today. He will go ahead and get his Aredia today.  I'll plan to  get him back in 2 months now.  Volanda Napoleon, MD 9/30/20159:39 AM

## 2013-11-24 LAB — PROTEIN ELECTROPHORESIS, SERUM, WITH REFLEX
ALBUMIN ELP: 50.7 % — AB (ref 55.8–66.1)
ALPHA-1-GLOBULIN: 5.1 % — AB (ref 2.9–4.9)
Alpha-2-Globulin: 10.9 % (ref 7.1–11.8)
Beta 2: 5.1 % (ref 3.2–6.5)
Beta Globulin: 5.4 % (ref 4.7–7.2)
Gamma Globulin: 22.8 % — ABNORMAL HIGH (ref 11.1–18.8)
M-Spike, %: 0.74 g/dL
TOTAL PROTEIN, SERUM ELECTROPHOR: 7.7 g/dL (ref 6.0–8.3)

## 2013-11-24 LAB — IGG, IGA, IGM
IGG (IMMUNOGLOBIN G), SERUM: 1720 mg/dL — AB (ref 650–1600)
IgA: 336 mg/dL (ref 68–379)
IgM, Serum: 64 mg/dL (ref 41–251)

## 2013-11-24 LAB — KAPPA/LAMBDA LIGHT CHAINS
KAPPA FREE LGHT CHN: 5.39 mg/dL — AB (ref 0.33–1.94)
Kappa:Lambda Ratio: 1.82 — ABNORMAL HIGH (ref 0.26–1.65)
Lambda Free Lght Chn: 2.96 mg/dL — ABNORMAL HIGH (ref 0.57–2.63)

## 2013-11-24 LAB — IFE INTERPRETATION

## 2013-11-24 LAB — TESTOSTERONE: Testosterone: 175 ng/dL — ABNORMAL LOW (ref 300–890)

## 2013-12-06 ENCOUNTER — Ambulatory Visit: Payer: Self-pay

## 2013-12-20 ENCOUNTER — Other Ambulatory Visit (HOSPITAL_BASED_OUTPATIENT_CLINIC_OR_DEPARTMENT_OTHER): Payer: Medicaid Other | Admitting: Lab

## 2013-12-20 ENCOUNTER — Ambulatory Visit (HOSPITAL_BASED_OUTPATIENT_CLINIC_OR_DEPARTMENT_OTHER): Payer: Medicaid Other

## 2013-12-20 VITALS — BP 153/72 | HR 78 | Temp 98.0°F | Resp 14

## 2013-12-20 DIAGNOSIS — M791 Myalgia: Secondary | ICD-10-CM

## 2013-12-20 DIAGNOSIS — E291 Testicular hypofunction: Secondary | ICD-10-CM

## 2013-12-20 DIAGNOSIS — E349 Endocrine disorder, unspecified: Secondary | ICD-10-CM

## 2013-12-20 LAB — CBC WITH DIFFERENTIAL (CANCER CENTER ONLY)
BASO#: 0.1 10*3/uL (ref 0.0–0.2)
BASO%: 0.9 % (ref 0.0–2.0)
EOS%: 3.5 % (ref 0.0–7.0)
Eosinophils Absolute: 0.2 10*3/uL (ref 0.0–0.5)
HCT: 39.1 % (ref 38.7–49.9)
HGB: 13.2 g/dL (ref 13.0–17.1)
LYMPH#: 1.7 10*3/uL (ref 0.9–3.3)
LYMPH%: 26.9 % (ref 14.0–48.0)
MCH: 26 pg — ABNORMAL LOW (ref 28.0–33.4)
MCHC: 33.8 g/dL (ref 32.0–35.9)
MCV: 77 fL — ABNORMAL LOW (ref 82–98)
MONO#: 0.5 10*3/uL (ref 0.1–0.9)
MONO%: 8.4 % (ref 0.0–13.0)
NEUT#: 3.8 10*3/uL (ref 1.5–6.5)
NEUT%: 60.3 % (ref 40.0–80.0)
Platelets: 214 10*3/uL (ref 145–400)
RBC: 5.08 10*6/uL (ref 4.20–5.70)
RDW: 15.5 % (ref 11.1–15.7)
WBC: 6.3 10*3/uL (ref 4.0–10.0)

## 2013-12-20 MED ORDER — TESTOSTERONE CYPIONATE 200 MG/ML IM SOLN
300.0000 mg | INTRAMUSCULAR | Status: DC
  Administered 2013-12-20: 300 mg via INTRAMUSCULAR

## 2013-12-20 MED ORDER — HYDROMORPHONE HCL 4 MG/ML IJ SOLN
1.0000 mg | Freq: Once | INTRAMUSCULAR | Status: AC
  Administered 2013-12-20: 1 mg via SUBCUTANEOUS

## 2013-12-20 MED ORDER — HYDROMORPHONE HCL 1 MG/ML IJ SOLN
INTRAMUSCULAR | Status: AC
  Filled 2013-12-20: qty 1

## 2013-12-20 MED ORDER — TESTOSTERONE CYPIONATE 200 MG/ML IM SOLN
INTRAMUSCULAR | Status: AC
  Filled 2013-12-20: qty 2

## 2013-12-20 NOTE — Patient Instructions (Signed)

## 2014-01-03 ENCOUNTER — Ambulatory Visit: Payer: Self-pay

## 2014-01-17 ENCOUNTER — Ambulatory Visit (HOSPITAL_BASED_OUTPATIENT_CLINIC_OR_DEPARTMENT_OTHER): Payer: Medicaid Other

## 2014-01-17 DIAGNOSIS — E291 Testicular hypofunction: Secondary | ICD-10-CM

## 2014-01-17 DIAGNOSIS — E349 Endocrine disorder, unspecified: Secondary | ICD-10-CM

## 2014-01-17 MED ORDER — TESTOSTERONE CYPIONATE 200 MG/ML IM SOLN
300.0000 mg | INTRAMUSCULAR | Status: DC
  Administered 2014-01-17: 300 mg via INTRAMUSCULAR

## 2014-01-17 MED ORDER — TESTOSTERONE CYPIONATE 200 MG/ML IM SOLN
INTRAMUSCULAR | Status: AC
  Filled 2014-01-17: qty 2

## 2014-01-17 NOTE — Patient Instructions (Signed)

## 2014-01-23 ENCOUNTER — Other Ambulatory Visit: Payer: Self-pay | Admitting: *Deleted

## 2014-01-23 DIAGNOSIS — C9000 Multiple myeloma not having achieved remission: Secondary | ICD-10-CM

## 2014-01-24 ENCOUNTER — Ambulatory Visit (HOSPITAL_BASED_OUTPATIENT_CLINIC_OR_DEPARTMENT_OTHER): Payer: Medicaid Other

## 2014-01-24 ENCOUNTER — Encounter: Payer: Self-pay | Admitting: Hematology & Oncology

## 2014-01-24 ENCOUNTER — Other Ambulatory Visit (HOSPITAL_BASED_OUTPATIENT_CLINIC_OR_DEPARTMENT_OTHER): Payer: Medicaid Other | Admitting: Lab

## 2014-01-24 ENCOUNTER — Ambulatory Visit (HOSPITAL_BASED_OUTPATIENT_CLINIC_OR_DEPARTMENT_OTHER): Payer: Medicaid Other | Admitting: Hematology & Oncology

## 2014-01-24 VITALS — BP 139/66 | HR 85 | Temp 98.4°F | Resp 18 | Ht 69.0 in | Wt 205.0 lb

## 2014-01-24 DIAGNOSIS — C9000 Multiple myeloma not having achieved remission: Secondary | ICD-10-CM

## 2014-01-24 DIAGNOSIS — N289 Disorder of kidney and ureter, unspecified: Secondary | ICD-10-CM

## 2014-01-24 DIAGNOSIS — D649 Anemia, unspecified: Secondary | ICD-10-CM

## 2014-01-24 DIAGNOSIS — N189 Chronic kidney disease, unspecified: Secondary | ICD-10-CM

## 2014-01-24 DIAGNOSIS — D509 Iron deficiency anemia, unspecified: Secondary | ICD-10-CM

## 2014-01-24 DIAGNOSIS — D631 Anemia in chronic kidney disease: Secondary | ICD-10-CM

## 2014-01-24 DIAGNOSIS — E349 Endocrine disorder, unspecified: Secondary | ICD-10-CM

## 2014-01-24 DIAGNOSIS — E291 Testicular hypofunction: Secondary | ICD-10-CM

## 2014-01-24 LAB — CBC WITH DIFFERENTIAL (CANCER CENTER ONLY)
BASO#: 0.1 10*3/uL (ref 0.0–0.2)
BASO%: 0.7 % (ref 0.0–2.0)
EOS%: 2.6 % (ref 0.0–7.0)
Eosinophils Absolute: 0.2 10*3/uL (ref 0.0–0.5)
HCT: 37.9 % — ABNORMAL LOW (ref 38.7–49.9)
HGB: 12.6 g/dL — ABNORMAL LOW (ref 13.0–17.1)
LYMPH#: 2 10*3/uL (ref 0.9–3.3)
LYMPH%: 27.9 % (ref 14.0–48.0)
MCH: 25.4 pg — ABNORMAL LOW (ref 28.0–33.4)
MCHC: 33.2 g/dL (ref 32.0–35.9)
MCV: 76 fL — ABNORMAL LOW (ref 82–98)
MONO#: 0.6 10*3/uL (ref 0.1–0.9)
MONO%: 8.9 % (ref 0.0–13.0)
NEUT#: 4.2 10*3/uL (ref 1.5–6.5)
NEUT%: 59.9 % (ref 40.0–80.0)
PLATELETS: 234 10*3/uL (ref 145–400)
RBC: 4.96 10*6/uL (ref 4.20–5.70)
RDW: 15.6 % (ref 11.1–15.7)
WBC: 7.1 10*3/uL (ref 4.0–10.0)

## 2014-01-24 LAB — IRON AND TIBC CHCC
%SAT: 39 % (ref 20–55)
IRON: 75 ug/dL (ref 42–163)
TIBC: 194 ug/dL — AB (ref 202–409)
UIBC: 118 ug/dL (ref 117–376)

## 2014-01-24 LAB — CMP (CANCER CENTER ONLY)
ALBUMIN: 3.5 g/dL (ref 3.3–5.5)
ALK PHOS: 82 U/L (ref 26–84)
ALT(SGPT): 23 U/L (ref 10–47)
AST: 26 U/L (ref 11–38)
BUN, Bld: 32 mg/dL — ABNORMAL HIGH (ref 7–22)
CO2: 29 mEq/L (ref 18–33)
Calcium: 8.7 mg/dL (ref 8.0–10.3)
Chloride: 97 mEq/L — ABNORMAL LOW (ref 98–108)
Creat: 2.4 mg/dl — ABNORMAL HIGH (ref 0.6–1.2)
Glucose, Bld: 239 mg/dL — ABNORMAL HIGH (ref 73–118)
POTASSIUM: 3.4 meq/L (ref 3.3–4.7)
Sodium: 137 mEq/L (ref 128–145)
Total Bilirubin: 0.4 mg/dl (ref 0.20–1.60)
Total Protein: 8.1 g/dL (ref 6.4–8.1)

## 2014-01-24 LAB — FERRITIN CHCC: Ferritin: 1664 ng/ml — ABNORMAL HIGH (ref 22–316)

## 2014-01-24 LAB — TECHNOLOGIST REVIEW CHCC SATELLITE: Tech Review: 1

## 2014-01-24 LAB — TESTOSTERONE: TESTOSTERONE: 1191 ng/dL — AB (ref 300–890)

## 2014-01-24 MED ORDER — HEPARIN SOD (PORK) LOCK FLUSH 100 UNIT/ML IV SOLN
500.0000 [IU] | Freq: Once | INTRAVENOUS | Status: AC
  Administered 2014-01-24: 500 [IU] via INTRAVENOUS
  Filled 2014-01-24: qty 5

## 2014-01-24 MED ORDER — SODIUM CHLORIDE 0.9 % IV SOLN: 60.0000 mg | Freq: Once | INTRAVENOUS | Status: DC

## 2014-01-24 MED ORDER — SODIUM CHLORIDE 0.9 % IV SOLN
60.0000 mg | INTRAVENOUS | Status: DC
  Administered 2014-01-24: 60 mg via INTRAVENOUS
  Filled 2014-01-24: qty 6.67

## 2014-01-24 MED ORDER — SODIUM CHLORIDE 0.9 % IV SOLN
INTRAVENOUS | Status: DC
  Administered 2014-01-24: 09:00:00 via INTRAVENOUS

## 2014-01-24 MED ORDER — SODIUM CHLORIDE 0.9 % IJ SOLN
10.0000 mL | INTRAMUSCULAR | Status: DC | PRN
  Administered 2014-01-24: 10 mL via INTRAVENOUS
  Filled 2014-01-24: qty 10

## 2014-01-24 MED ORDER — KETOROLAC TROMETHAMINE 15 MG/ML IJ SOLN
INTRAMUSCULAR | Status: AC
  Filled 2014-01-24: qty 2

## 2014-01-24 MED ORDER — SODIUM CHLORIDE 0.9 % IV SOLN
510.0000 mg | Freq: Once | INTRAVENOUS | Status: AC
  Administered 2014-01-24: 510 mg via INTRAVENOUS
  Filled 2014-01-24: qty 17

## 2014-01-24 MED ORDER — KETOROLAC TROMETHAMINE 30 MG/ML IJ SOLN
30.0000 mg | Freq: Once | INTRAMUSCULAR | Status: AC
  Administered 2014-01-24: 30 mg via INTRAVENOUS
  Filled 2014-01-24: qty 1

## 2014-01-24 NOTE — Patient Instructions (Signed)
Pamidronate injection What is this medicine? PAMIDRONATE (pa mi DROE nate) slows calcium loss from bones. It is used to treat high calcium blood levels from cancer or Paget's disease. It is also used to treat bone pain and prevent fractures from certain cancers that have spread to the bone. This medicine may be used for other purposes; ask your health care provider or pharmacist if you have questions. COMMON BRAND NAME(S): Aredia What should I tell my health care provider before I take this medicine? They need to know if you have any of these conditions: -aspirin-sensitive asthma -dental disease -kidney disease -an unusual or allergic reaction to pamidronate, other medicines, foods, dyes, or preservatives -pregnant or trying to get pregnant -breast-feeding How should I use this medicine? This medicine is for infusion into a vein. It is given by a health care professional in a hospital or clinic setting. Talk to your pediatrician regarding the use of this medicine in children. This medicine is not approved for use in children. Overdosage: If you think you have taken too much of this medicine contact a poison control center or emergency room at once. NOTE: This medicine is only for you. Do not share this medicine with others. What if I miss a dose? This does not apply. What may interact with this medicine? -certain antibiotics given by injection -medicines for inflammation or pain like ibuprofen, naproxen -some diuretics like bumetanide, furosemide -cyclosporine -parathyroid hormone -tacrolimus -teriparatide -thalidomide This list may not describe all possible interactions. Give your health care provider a list of all the medicines, herbs, non-prescription drugs, or dietary supplements you use. Also tell them if you smoke, drink alcohol, or use illegal drugs. Some items may interact with your medicine. What should I watch for while using this medicine? Visit your doctor or health care  professional for regular checkups. It may be some time before you see the benefit from this medicine. Do not stop taking your medicine unless your doctor tells you to. Your doctor may order blood tests or other tests to see how you are doing. Women should inform their doctor if they wish to become pregnant or think they might be pregnant. There is a potential for serious side effects to an unborn child. Talk to your health care professional or pharmacist for more information. You should make sure that you get enough calcium and vitamin D while you are taking this medicine. Discuss the foods you eat and the vitamins you take with your health care professional. Some people who take this medicine have severe bone, joint, and/or muscle pain. This medicine may also increase your risk for a broken thigh bone. Tell your doctor right away if you have pain in your upper leg or groin. Tell your doctor if you have any pain that does not go away or that gets worse. What side effects may I notice from receiving this medicine? Side effects that you should report to your doctor or health care professional as soon as possible: -allergic reactions like skin rash, itching or hives, swelling of the face, lips, or tongue -black or tarry stools -changes in vision -eye inflammation, pain -high blood pressure -jaw pain, especially burning or cramping -muscle weakness -numb, tingling pain -swelling of feet or hands -trouble passing urine or change in the amount of urine -unable to move easily Side effects that usually do not require medical attention (report to your doctor or health care professional if they continue or are bothersome): -bone, joint, or muscle pain -constipation -dizzy, drowsy -  fever -headache -loss of appetite -nausea, vomiting -pain at site where injected This list may not describe all possible side effects. Call your doctor for medical advice about side effects. You may report side effects to  FDA at 1-800-FDA-1088. Where should I keep my medicine? This drug is given in a hospital or clinic and will not be stored at home. NOTE: This sheet is a summary. It may not cover all possible information. If you have questions about this medicine, talk to your doctor, pharmacist, or health care provider.  2015, Elsevier/Gold Standard. (2010-08-08 08:49:49) Ferumoxytol injection What is this medicine? FERUMOXYTOL is an iron complex. Iron is used to make healthy red blood cells, which carry oxygen and nutrients throughout the body. This medicine is used to treat iron deficiency anemia in people with chronic kidney disease. This medicine may be used for other purposes; ask your health care provider or pharmacist if you have questions. COMMON BRAND NAME(S): Feraheme What should I tell my health care provider before I take this medicine? They need to know if you have any of these conditions: -anemia not caused by low iron levels -high levels of iron in the blood -magnetic resonance imaging (MRI) test scheduled -an unusual or allergic reaction to iron, other medicines, foods, dyes, or preservatives -pregnant or trying to get pregnant -breast-feeding How should I use this medicine? This medicine is for injection into a vein. It is given by a health care professional in a hospital or clinic setting. Talk to your pediatrician regarding the use of this medicine in children. Special care may be needed. Overdosage: If you think you've taken too much of this medicine contact a poison control center or emergency room at once. Overdosage: If you think you have taken too much of this medicine contact a poison control center or emergency room at once. NOTE: This medicine is only for you. Do not share this medicine with others. What if I miss a dose? It is important not to miss your dose. Call your doctor or health care professional if you are unable to keep an appointment. What may interact with this  medicine? This medicine may interact with the following medications: -other iron products This list may not describe all possible interactions. Give your health care provider a list of all the medicines, herbs, non-prescription drugs, or dietary supplements you use. Also tell them if you smoke, drink alcohol, or use illegal drugs. Some items may interact with your medicine. What should I watch for while using this medicine? Visit your doctor or healthcare professional regularly. Tell your doctor or healthcare professional if your symptoms do not start to get better or if they get worse. You may need blood work done while you are taking this medicine. You may need to follow a special diet. Talk to your doctor. Foods that contain iron include: whole grains/cereals, dried fruits, beans, or peas, leafy green vegetables, and organ meats (liver, kidney). What side effects may I notice from receiving this medicine? Side effects that you should report to your doctor or health care professional as soon as possible: -allergic reactions like skin rash, itching or hives, swelling of the face, lips, or tongue -breathing problems -changes in blood pressure -feeling faint or lightheaded, falls -fever or chills -flushing, sweating, or hot feelings -swelling of the ankles or feet Side effects that usually do not require medical attention (Report these to your doctor or health care professional if they continue or are bothersome.): -diarrhea -headache -nausea, vomiting -stomach pain This  list may not describe all possible side effects. Call your doctor for medical advice about side effects. You may report side effects to FDA at 1-800-FDA-1088. Where should I keep my medicine? This drug is given in a hospital or clinic and will not be stored at home. NOTE: This sheet is a summary. It may not cover all possible information. If you have questions about this medicine, talk to your doctor, pharmacist, or health  care provider.  2015, Elsevier/Gold Standard. (2011-09-25 15:23:36)

## 2014-01-24 NOTE — Progress Notes (Signed)
Hematology and Oncology Follow Up Visit  [name] [id number] September 04, 1950 63 y.o. 01/24/2014   Principle Diagnosis:  1. IgG kappa myeloma. 2. Anemia secondary to renal insufficiency. 3. Intermittent iron-deficiency anemia. 4. Hypotestosteronemia  Current Therapy:   1. Aredia 60 mg IV q.2 months. 2. Aranesp 300 mcg subcu as needed for hemoglobin less than      11. 3. Depo-Testosterone 300 mg q.2 weeks. 4. IV iron as indicated.     Interim History:  [name] is back for followup. Had a good Thanksgiving. He was with his family. He why status what he ate. He has not had problems with his diabetes.  He does feel a little bit tired. Is possible that his iron might be on the low side.  His last testosterone level was 175. I think this is back in October. There's been no fever. He's had no bleeding. He's had no cough.  His last myelo studies done back in mid September showed a monoclonal spike of 0. 74 g/L. His IgG level was 1720 mg/dL. His kappa light chain was 5 4 mg/dL.  He had his last iron studies back in August. Iron saturation of 33%. His ferritin was 2400. A lot of the ferritin is an acute phase reactant.   Medications: Current outpatient prescriptions: amLODipine (NORVASC) 10 MG tablet, Take 10 mg by mouth daily., Disp: , Rfl: ;  aspirin 81 MG tablet, Take 81 mg by mouth daily., Disp: , Rfl: ;  CARAFATE 1 GM/10ML suspension, Take 1 g by mouth 4 (four) times daily. , Disp: , Rfl: ;  diazepam (VALIUM) 5 MG tablet, Take 5 mg by mouth every 4 (four) hours as needed for anxiety., Disp: , Rfl:  ergocalciferol (VITAMIN D2) 50000 UNITS capsule, Take 50,000 Units by mouth once a week.  , Disp: , Rfl: ;  furosemide (LASIX) 40 MG tablet, Take 40 mg by mouth., Disp: , Rfl: ;  glipiZIDE (GLUCOTROL) 5 MG tablet, Take 5 mg by mouth daily before breakfast., Disp: , Rfl: ;  Hypromellose (NATURAL BALANCE TEARS OP), Apply to eye every morning., Disp: , Rfl:  meclizine (ANTIVERT) 50 MG  tablet, Take 0.5 tablets (25 mg total) by mouth 4 (four) times daily as needed for dizziness., Disp: 30 tablet, Rfl: 0;  meloxicam (MOBIC) 15 MG tablet, Take 1 tablet (15 mg total) by mouth daily., Disp: 30 tablet, Rfl: 2;  methocarbamol (ROBAXIN) 500 MG tablet, Take 500 mg by mouth 2 (two) times daily as needed for muscle spasms. , Disp: , Rfl:  omeprazole (PRILOSEC) 20 MG capsule, Take 1 capsule (20 mg total) by mouth daily., Disp: 30 capsule, Rfl: 11;  OxyCODONE (OXYCONTIN) 80 mg T12A 12 hr tablet, Take 80 mg by mouth 3 (three) times daily., Disp: , Rfl: ;  oxyCODONE-acetaminophen (PERCOCET) 10-325 MG per tablet, Take 1 tablet by mouth every 6 (six) hours as needed. Pain, Disp: , Rfl: ;  polyethylene glycol (MIRALAX / GLYCOLAX) packet, Take 17 g by mouth daily., Disp: , Rfl:  Polyvinyl Alcohol-Povidone (CLEAR EYES ALL SEASONS) 5-6 MG/ML SOLN, Apply 2 drops to eye daily as needed (drye eyes.)., Disp: , Rfl: ;  potassium chloride SA (K-DUR,KLOR-CON) 20 MEQ tablet, Take 20 mEq by mouth as needed (takes with furosemide). , Disp: , Rfl: ;  Tamsulosin HCl (FLOMAX) 0.4 MG CAPS, Take 0.4 mg by mouth daily. , Disp: , Rfl:  triamterene-hydrochlorothiazide (MAXZIDE-25) 37.5-25 MG per tablet, Take 1 tablet by mouth daily.  , Disp: , Rfl: ;  Vitamin  D, Ergocalciferol, (DRISDOL) 50000 UNITS CAPS capsule, Take 50,000 Units by mouth every 7 (seven) days., Disp: , Rfl: ;  [DISCONTINUED] potassium chloride (KLOR-CON) 20 MEQ packet, Take 20 mEq by mouth daily. , Disp: , Rfl:   Allergies:  Allergies  Allergen Reactions  . Iodine Rash  . Iodine Anxiety    Didn't feel right "instructed not to take per MD--something with his port"    Past Medical History, Surgical history, Social history, and Family History were reviewed and updated.  Review of Systems: As above  Physical Exam:  height is 5\' 9"  (1.753 m) and weight is 205 lb (92.987 kg). His oral temperature is 98.4 F (36.9 C). His blood pressure is 139/66 and his  pulse is 85. His respiration is 18.   Lungs are clear. Cardiac exam regular rate and rhythm with no murmurs, rubs or bruits.. Abdomen is soft. He has good bowel sounds. There is no fluid wave. There is no palpable liver or spleen tip. Back exam shows no tenderness in the lumbar spine. There is no paravertebral muscle spasms. . He has slightly decreased range of motion of the lower back. Extremities shows no clubbing cyanosis or edema. He has decent strength in his legs. Skin exam shows no rashes. Neurological exam is nonfocal.  Lab Results  Component Value Date   WBC 7.1 01/24/2014   HGB 12.6* 01/24/2014   HCT 37.9* 01/24/2014   MCV 76* 01/24/2014   PLT 234 01/24/2014     Chemistry      Component Value Date/Time   NA 137 01/24/2014 0812   NA 139 11/05/2013 1217   K 3.4 01/24/2014 0812   K 4.1 11/05/2013 1217   CL 97* 01/24/2014 0812   CL 100 11/05/2013 1217   CO2 29 01/24/2014 0812   CO2 27 11/05/2013 1217   BUN 32* 01/24/2014 0812   BUN 35* 11/05/2013 1217   CREATININE 2.4* 01/24/2014 0812   CREATININE 2.50* 11/05/2013 1217      Component Value Date/Time   CALCIUM 8.7 01/24/2014 0812   CALCIUM 9.0 11/05/2013 1217   ALKPHOS 82 01/24/2014 0812   ALKPHOS 65 11/05/2013 1217   AST 26 01/24/2014 0812   AST 24 11/05/2013 1217   ALT 23 01/24/2014 0812   ALT 16 11/05/2013 1217   BILITOT 0.40 01/24/2014 0812   BILITOT 0.3 11/05/2013 1217         Impression and Plan: [name] is a 63 year old gentleman. He has multiple medical problems. I still do not think that myeloma is an issue. He has fibromyalgia. He has diabetes. He has low testosterone.  I think he needs iron today. His MCV is dropping His hemoglobin is going down. I will give her a dose of Feraheme 510 mg.   He will go ahead and get his Aredia today.  I'll plan to get him back in 2 months now.  Volanda Napoleon, MD 12/2/20158:55 AM

## 2014-02-09 ENCOUNTER — Other Ambulatory Visit: Payer: Self-pay | Admitting: *Deleted

## 2014-02-09 DIAGNOSIS — M545 Low back pain: Secondary | ICD-10-CM

## 2014-02-09 DIAGNOSIS — M2550 Pain in unspecified joint: Secondary | ICD-10-CM

## 2014-02-09 DIAGNOSIS — C9000 Multiple myeloma not having achieved remission: Secondary | ICD-10-CM

## 2014-02-09 DIAGNOSIS — E349 Endocrine disorder, unspecified: Secondary | ICD-10-CM

## 2014-02-09 MED ORDER — MELOXICAM 15 MG PO TABS: 15.0000 mg | ORAL_TABLET | Freq: Every day | ORAL | Status: DC

## 2014-02-19 ENCOUNTER — Telehealth: Payer: Self-pay | Admitting: Hematology & Oncology

## 2014-02-19 NOTE — Telephone Encounter (Signed)
Patient called and xcx 02/21/14 apt due to getting teeth pulled out.  He resch for 02/26/14

## 2014-02-21 ENCOUNTER — Ambulatory Visit: Payer: Self-pay

## 2014-02-22 ENCOUNTER — Other Ambulatory Visit: Payer: Self-pay | Admitting: *Deleted

## 2014-02-22 ENCOUNTER — Ambulatory Visit: Payer: Self-pay

## 2014-02-22 DIAGNOSIS — C9000 Multiple myeloma not having achieved remission: Secondary | ICD-10-CM

## 2014-02-26 ENCOUNTER — Ambulatory Visit (HOSPITAL_BASED_OUTPATIENT_CLINIC_OR_DEPARTMENT_OTHER): Payer: Medicaid Other

## 2014-02-26 ENCOUNTER — Other Ambulatory Visit (HOSPITAL_BASED_OUTPATIENT_CLINIC_OR_DEPARTMENT_OTHER): Payer: Medicaid Other | Admitting: Lab

## 2014-02-26 DIAGNOSIS — D631 Anemia in chronic kidney disease: Secondary | ICD-10-CM

## 2014-02-26 DIAGNOSIS — E349 Endocrine disorder, unspecified: Secondary | ICD-10-CM

## 2014-02-26 DIAGNOSIS — E291 Testicular hypofunction: Secondary | ICD-10-CM

## 2014-02-26 DIAGNOSIS — D509 Iron deficiency anemia, unspecified: Secondary | ICD-10-CM

## 2014-02-26 DIAGNOSIS — C9000 Multiple myeloma not having achieved remission: Secondary | ICD-10-CM

## 2014-02-26 DIAGNOSIS — N189 Chronic kidney disease, unspecified: Secondary | ICD-10-CM

## 2014-02-26 LAB — CBC WITH DIFFERENTIAL (CANCER CENTER ONLY)
BASO#: 0.1 10*3/uL (ref 0.0–0.2)
BASO%: 0.8 % (ref 0.0–2.0)
EOS%: 3.8 % (ref 0.0–7.0)
Eosinophils Absolute: 0.2 10*3/uL (ref 0.0–0.5)
HCT: 39.3 % (ref 38.7–49.9)
HGB: 13 g/dL (ref 13.0–17.1)
LYMPH#: 1.8 10*3/uL (ref 0.9–3.3)
LYMPH%: 29.6 % (ref 14.0–48.0)
MCH: 25.2 pg — AB (ref 28.0–33.4)
MCHC: 33.1 g/dL (ref 32.0–35.9)
MCV: 76 fL — ABNORMAL LOW (ref 82–98)
MONO#: 0.6 10*3/uL (ref 0.1–0.9)
MONO%: 9.2 % (ref 0.0–13.0)
NEUT#: 3.5 10*3/uL (ref 1.5–6.5)
NEUT%: 56.6 % (ref 40.0–80.0)
PLATELETS: 199 10*3/uL (ref 145–400)
RBC: 5.16 10*6/uL (ref 4.20–5.70)
RDW: 15.8 % — ABNORMAL HIGH (ref 11.1–15.7)
WBC: 6.1 10*3/uL (ref 4.0–10.0)

## 2014-02-26 MED ORDER — TESTOSTERONE CYPIONATE 200 MG/ML IM SOLN
INTRAMUSCULAR | Status: AC
  Filled 2014-02-26: qty 2

## 2014-02-26 MED ORDER — TESTOSTERONE CYPIONATE 200 MG/ML IM SOLN
300.0000 mg | INTRAMUSCULAR | Status: DC
  Administered 2014-02-26: 300 mg via INTRAMUSCULAR

## 2014-02-26 NOTE — Patient Instructions (Signed)

## 2014-03-21 ENCOUNTER — Other Ambulatory Visit (HOSPITAL_BASED_OUTPATIENT_CLINIC_OR_DEPARTMENT_OTHER): Payer: Medicaid Other | Admitting: Lab

## 2014-03-21 ENCOUNTER — Ambulatory Visit (HOSPITAL_BASED_OUTPATIENT_CLINIC_OR_DEPARTMENT_OTHER): Payer: Medicaid Other

## 2014-03-21 ENCOUNTER — Ambulatory Visit (HOSPITAL_BASED_OUTPATIENT_CLINIC_OR_DEPARTMENT_OTHER): Payer: Medicaid Other | Admitting: Hematology & Oncology

## 2014-03-21 VITALS — BP 140/67 | HR 80 | Temp 98.1°F | Resp 18 | Ht 69.0 in | Wt 204.0 lb

## 2014-03-21 DIAGNOSIS — E119 Type 2 diabetes mellitus without complications: Secondary | ICD-10-CM

## 2014-03-21 DIAGNOSIS — E349 Endocrine disorder, unspecified: Secondary | ICD-10-CM

## 2014-03-21 DIAGNOSIS — C9000 Multiple myeloma not having achieved remission: Secondary | ICD-10-CM

## 2014-03-21 DIAGNOSIS — E291 Testicular hypofunction: Secondary | ICD-10-CM

## 2014-03-21 DIAGNOSIS — D509 Iron deficiency anemia, unspecified: Secondary | ICD-10-CM

## 2014-03-21 DIAGNOSIS — N289 Disorder of kidney and ureter, unspecified: Secondary | ICD-10-CM

## 2014-03-21 DIAGNOSIS — M2550 Pain in unspecified joint: Secondary | ICD-10-CM

## 2014-03-21 LAB — CBC WITH DIFFERENTIAL (CANCER CENTER ONLY)
BASO#: 0.1 10*3/uL (ref 0.0–0.2)
BASO%: 0.7 % (ref 0.0–2.0)
EOS ABS: 0.3 10*3/uL (ref 0.0–0.5)
EOS%: 3.5 % (ref 0.0–7.0)
HEMATOCRIT: 34 % — AB (ref 38.7–49.9)
HGB: 11.3 g/dL — ABNORMAL LOW (ref 13.0–17.1)
LYMPH#: 2.4 10*3/uL (ref 0.9–3.3)
LYMPH%: 33.5 % (ref 14.0–48.0)
MCH: 25.2 pg — AB (ref 28.0–33.4)
MCHC: 33.2 g/dL (ref 32.0–35.9)
MCV: 76 fL — ABNORMAL LOW (ref 82–98)
MONO#: 0.7 10*3/uL (ref 0.1–0.9)
MONO%: 10 % (ref 0.0–13.0)
NEUT#: 3.7 10*3/uL (ref 1.5–6.5)
NEUT%: 52.3 % (ref 40.0–80.0)
Platelets: 195 10*3/uL (ref 145–400)
RBC: 4.49 10*6/uL (ref 4.20–5.70)
RDW: 16.9 % — ABNORMAL HIGH (ref 11.1–15.7)
WBC: 7.1 10*3/uL (ref 4.0–10.0)

## 2014-03-21 LAB — IRON AND TIBC CHCC
%SAT: 55 % (ref 20–55)
Iron: 109 ug/dL (ref 42–163)
TIBC: 198 ug/dL — ABNORMAL LOW (ref 202–409)
UIBC: 89 ug/dL — ABNORMAL LOW (ref 117–376)

## 2014-03-21 LAB — FERRITIN CHCC: Ferritin: 2397 ng/ml — ABNORMAL HIGH (ref 22–316)

## 2014-03-21 LAB — CHCC SATELLITE - SMEAR

## 2014-03-21 MED ORDER — TESTOSTERONE CYPIONATE 200 MG/ML IM SOLN
INTRAMUSCULAR | Status: AC
  Filled 2014-03-21: qty 2

## 2014-03-21 MED ORDER — TESTOSTERONE CYPIONATE 200 MG/ML IM SOLN
300.0000 mg | INTRAMUSCULAR | Status: DC
  Administered 2014-03-21: 300 mg via INTRAMUSCULAR

## 2014-03-21 NOTE — Patient Instructions (Signed)

## 2014-03-21 NOTE — Progress Notes (Signed)
Hematology and Oncology Follow Up Visit  [name] [id number] 06/15/1950 64 y.o. 03/21/2014   Principle Diagnosis:  1. IgG kappa myeloma. 2. Anemia secondary to renal insufficiency. 3. Intermittent iron-deficiency anemia. 4. Hypotestosteronemia  Current Therapy:   1. Aredia 60 mg IV q.2 months - on hold for tooth extraction 2. Aranesp 300 mcg subcu as needed for hemoglobin less than      11. 3. Depo-Testosterone 300 mg q.2 weeks. 4. IV iron as indicated.     Interim History:  [name] is back for followup. 64-year-old He had a tooth pulled 2 weeks ago. He's having some teeth problems. I wouldn't think that this is from the Aredia. However, I will hold the Aredia for a few months to make sure that everything heals up.  He feels somewhat tired. He's had no bleeding. He says his blood sugars have been doing okay.  His last ferritin was 1600 with an iron saturation 39%. As such, I don't think iron deficiency is a problem.  His last testosterone level was 1000. As such that is doing well.  We have not checked his myeloma levels for about 4 months. I will have to make sure that these get checked.  Medications:  Current outpatient prescriptions:  .  amLODipine (NORVASC) 10 MG tablet, Take 10 mg by mouth daily., Disp: , Rfl:  .  aspirin 81 MG tablet, Take 81 mg by mouth daily., Disp: , Rfl:  .  CARAFATE 1 GM/10ML suspension, Take 1 g by mouth 4 (four) times daily. , Disp: , Rfl:  .  diazepam (VALIUM) 5 MG tablet, Take 5 mg by mouth every 4 (four) hours as needed for anxiety., Disp: , Rfl:  .  ergocalciferol (VITAMIN D2) 50000 UNITS capsule, Take 50,000 Units by mouth once a week.  , Disp: , Rfl:  .  furosemide (LASIX) 40 MG tablet, Take 40 mg by mouth., Disp: , Rfl:  .  glipiZIDE (GLUCOTROL) 5 MG tablet, Take 5 mg by mouth daily before breakfast., Disp: , Rfl:  .  Hypromellose (NATURAL BALANCE TEARS OP), Apply to eye every morning., Disp: , Rfl:  .  meclizine (ANTIVERT) 50 MG tablet, Take  0.5 tablets (25 mg total) by mouth 4 (four) times daily as needed for dizziness., Disp: 30 tablet, Rfl: 0 .  meloxicam (MOBIC) 15 MG tablet, Take 1 tablet (15 mg total) by mouth daily., Disp: 30 tablet, Rfl: 2 .  methocarbamol (ROBAXIN) 500 MG tablet, Take 500 mg by mouth 2 (two) times daily as needed for muscle spasms. , Disp: , Rfl:  .  omeprazole (PRILOSEC) 20 MG capsule, Take 1 capsule (20 mg total) by mouth daily., Disp: 30 capsule, Rfl: 11 .  OxyCODONE (OXYCONTIN) 80 mg T12A 12 hr tablet, Take 80 mg by mouth 3 (three) times daily., Disp: , Rfl:  .  oxyCODONE-acetaminophen (PERCOCET) 10-325 MG per tablet, Take 1 tablet by mouth every 6 (six) hours as needed. Pain, Disp: , Rfl:  .  polyethylene glycol (MIRALAX / GLYCOLAX) packet, Take 17 g by mouth daily., Disp: , Rfl:  .  Polyvinyl Alcohol-Povidone (CLEAR EYES ALL SEASONS) 5-6 MG/ML SOLN, Apply 2 drops to eye daily as needed (drye eyes.)., Disp: , Rfl:  .  Tamsulosin HCl (FLOMAX) 0.4 MG CAPS, Take 0.4 mg by mouth daily. , Disp: , Rfl:  .  triamterene-hydrochlorothiazide (MAXZIDE-25) 37.5-25 MG per tablet, Take 1 tablet by mouth daily.  , Disp: , Rfl:  .  Vitamin D, Ergocalciferol, (DRISDOL) 50000 UNITS CAPS  capsule, Take 50,000 Units by mouth every 7 (seven) days., Disp: , Rfl:  .  potassium chloride SA (K-DUR,KLOR-CON) 20 MEQ tablet, Take 20 mEq by mouth as needed (takes with furosemide). , Disp: , Rfl:  .  [DISCONTINUED] potassium chloride (KLOR-CON) 20 MEQ packet, Take 20 mEq by mouth daily. , Disp: , Rfl:   Allergies:  Allergies  Allergen Reactions  . Iodine Rash  . Iodine Anxiety    Didn't feel right "instructed not to take per MD--something with his port"    Past Medical History, Surgical history, Social history, and Family History were reviewed and updated.  Review of Systems: As above  Physical Exam:  height is 5\' 9"  (1.753 m) and weight is 204 lb (92.534 kg). His oral temperature is 98.1 F (36.7 C). His blood pressure is  140/67 and his pulse is 80. His respiration is 18.   His head and neck exam shows no ocular or oral lesions. He has no adenopathy in the neck. His thyroid is non-palpable. Lungs are clear. Cardiac exam regular rate and rhythm with no murmurs, rubs or bruits.. Abdomen is soft. He has good bowel sounds. There is no fluid wave. There is no palpable liver or spleen tip. Back exam shows no tenderness in the lumbar spine. There is no paravertebral muscle spasms. . He has slightly decreased range of motion of the lower back. Extremities shows no clubbing cyanosis or edema. He has decent strength in his legs. Skin exam shows no rashes. Neurological exam is nonfocal.  Lab Results  Component Value Date   WBC 7.1 03/21/2014   HGB 11.3* 03/21/2014   HCT 34.0* 03/21/2014   MCV 76* 03/21/2014   PLT 195 03/21/2014     Chemistry      Component Value Date/Time   NA 137 01/24/2014 0812   NA 139 11/05/2013 1217   K 3.4 01/24/2014 0812   K 4.1 11/05/2013 1217   CL 97* 01/24/2014 0812   CL 100 11/05/2013 1217   CO2 29 01/24/2014 0812   CO2 27 11/05/2013 1217   BUN 32* 01/24/2014 0812   BUN 35* 11/05/2013 1217   CREATININE 2.4* 01/24/2014 0812   CREATININE 2.50* 11/05/2013 1217      Component Value Date/Time   CALCIUM 8.7 01/24/2014 0812   CALCIUM 9.0 11/05/2013 1217   ALKPHOS 82 01/24/2014 0812   ALKPHOS 65 11/05/2013 1217   AST 26 01/24/2014 0812   AST 24 11/05/2013 1217   ALT 23 01/24/2014 0812   ALT 16 11/05/2013 1217   BILITOT 0.40 01/24/2014 0812   BILITOT 0.3 11/05/2013 1217         Impression and Plan: [name] is a 64 year old gentleman. He has multiple medical problems. I still do not think that myeloma is an issue. He has fibromyalgia. He has diabetes. He has low testosterone.  I we will hold off on the Aredia for right now. We probably need to hold off for several months on his Aredia.  We will go ahead and give him testosterone today.  I'll plan to see him back in about  2 months. By then, I think we can get his Aredia restarted since it would be about 4 months since his last dose. Volanda Napoleon, MD 1/27/20169:32 AM

## 2014-03-22 LAB — COMPREHENSIVE METABOLIC PANEL
ALK PHOS: 69 U/L (ref 39–117)
ALT: 19 U/L (ref 0–53)
AST: 22 U/L (ref 0–37)
Albumin: 3.8 g/dL (ref 3.5–5.2)
BILIRUBIN TOTAL: 0.4 mg/dL (ref 0.2–1.2)
BUN: 23 mg/dL (ref 6–23)
CALCIUM: 9 mg/dL (ref 8.4–10.5)
CHLORIDE: 100 meq/L (ref 96–112)
CO2: 29 mEq/L (ref 19–32)
Creatinine, Ser: 2.19 mg/dL — ABNORMAL HIGH (ref 0.50–1.35)
Glucose, Bld: 128 mg/dL — ABNORMAL HIGH (ref 70–99)
POTASSIUM: 3.6 meq/L (ref 3.5–5.3)
Sodium: 139 mEq/L (ref 135–145)
Total Protein: 7.4 g/dL (ref 6.0–8.3)

## 2014-03-22 LAB — IGG, IGA, IGM
IGA: 363 mg/dL (ref 68–379)
IgG (Immunoglobin G), Serum: 1870 mg/dL — ABNORMAL HIGH (ref 650–1600)
IgM, Serum: 73 mg/dL (ref 41–251)

## 2014-03-22 LAB — TESTOSTERONE: TESTOSTERONE: 288 ng/dL — AB (ref 300–890)

## 2014-03-22 LAB — KAPPA/LAMBDA LIGHT CHAINS
KAPPA FREE LGHT CHN: 5.08 mg/dL — AB (ref 0.33–1.94)
Kappa:Lambda Ratio: 1.42 (ref 0.26–1.65)
LAMBDA FREE LGHT CHN: 3.57 mg/dL — AB (ref 0.57–2.63)

## 2014-03-22 LAB — RETICULOCYTES (CHCC)
ABS Retic: 27.5 10*3/uL (ref 19.0–186.0)
RBC.: 4.59 MIL/uL (ref 4.22–5.81)
Retic Ct Pct: 0.6 % (ref 0.4–2.3)

## 2014-04-18 ENCOUNTER — Ambulatory Visit (HOSPITAL_BASED_OUTPATIENT_CLINIC_OR_DEPARTMENT_OTHER): Payer: Medicaid Other

## 2014-04-18 ENCOUNTER — Ambulatory Visit (HOSPITAL_BASED_OUTPATIENT_CLINIC_OR_DEPARTMENT_OTHER): Payer: Medicaid Other | Admitting: Lab

## 2014-04-18 ENCOUNTER — Other Ambulatory Visit: Payer: Self-pay | Admitting: *Deleted

## 2014-04-18 DIAGNOSIS — E349 Endocrine disorder, unspecified: Secondary | ICD-10-CM

## 2014-04-18 DIAGNOSIS — E291 Testicular hypofunction: Secondary | ICD-10-CM

## 2014-04-18 DIAGNOSIS — C9000 Multiple myeloma not having achieved remission: Secondary | ICD-10-CM

## 2014-04-18 DIAGNOSIS — D509 Iron deficiency anemia, unspecified: Secondary | ICD-10-CM

## 2014-04-18 DIAGNOSIS — M2550 Pain in unspecified joint: Secondary | ICD-10-CM

## 2014-04-18 LAB — FERRITIN CHCC: Ferritin: 2654 ng/ml — ABNORMAL HIGH (ref 22–316)

## 2014-04-18 LAB — CBC WITH DIFFERENTIAL (CANCER CENTER ONLY)
BASO#: 0.1 10*3/uL (ref 0.0–0.2)
BASO%: 0.9 % (ref 0.0–2.0)
EOS ABS: 0.4 10*3/uL (ref 0.0–0.5)
EOS%: 6.7 % (ref 0.0–7.0)
HEMATOCRIT: 34 % — AB (ref 38.7–49.9)
HGB: 11.1 g/dL — ABNORMAL LOW (ref 13.0–17.1)
LYMPH#: 1.7 10*3/uL (ref 0.9–3.3)
LYMPH%: 26 % (ref 14.0–48.0)
MCH: 25.3 pg — AB (ref 28.0–33.4)
MCHC: 32.6 g/dL (ref 32.0–35.9)
MCV: 78 fL — ABNORMAL LOW (ref 82–98)
MONO#: 0.6 10*3/uL (ref 0.1–0.9)
MONO%: 8.7 % (ref 0.0–13.0)
NEUT%: 57.7 % (ref 40.0–80.0)
NEUTROS ABS: 3.8 10*3/uL (ref 1.5–6.5)
PLATELETS: 201 10*3/uL (ref 145–400)
RBC: 4.38 10*6/uL (ref 4.20–5.70)
RDW: 16.2 % — ABNORMAL HIGH (ref 11.1–15.7)
WBC: 6.5 10*3/uL (ref 4.0–10.0)

## 2014-04-18 LAB — IRON AND TIBC CHCC
%SAT: >100 % (ref 20–?)
Iron: 193 ug/dL — ABNORMAL HIGH (ref 42–163)
TIBC: 191 ug/dL — AB (ref 202–409)

## 2014-04-18 LAB — CMP (CANCER CENTER ONLY)
ALT(SGPT): 27 U/L (ref 10–47)
AST: 34 U/L (ref 11–38)
Albumin: 3.6 g/dL (ref 3.3–5.5)
Alkaline Phosphatase: 79 U/L (ref 26–84)
BUN, Bld: 33 mg/dL — ABNORMAL HIGH (ref 7–22)
CALCIUM: 9.3 mg/dL (ref 8.0–10.3)
CO2: 29 meq/L (ref 18–33)
Chloride: 98 mEq/L (ref 98–108)
Creat: 2.4 mg/dl — ABNORMAL HIGH (ref 0.6–1.2)
GLUCOSE: 152 mg/dL — AB (ref 73–118)
POTASSIUM: 3.9 meq/L (ref 3.3–4.7)
Sodium: 141 mEq/L (ref 128–145)
TOTAL PROTEIN: 8.6 g/dL — AB (ref 6.4–8.1)
Total Bilirubin: 0.6 mg/dl (ref 0.20–1.60)

## 2014-04-18 MED ORDER — TESTOSTERONE CYPIONATE 200 MG/ML IM SOLN
300.0000 mg | INTRAMUSCULAR | Status: DC
  Administered 2014-04-18: 300 mg via INTRAMUSCULAR

## 2014-04-18 MED ORDER — PROCHLORPERAZINE MALEATE 10 MG PO TABS: 10.0000 mg | ORAL_TABLET | Freq: Four times a day (QID) | ORAL | Status: DC | PRN

## 2014-04-18 MED ORDER — TESTOSTERONE CYPIONATE 200 MG/ML IM SOLN
INTRAMUSCULAR | Status: AC
  Filled 2014-04-18: qty 2

## 2014-04-18 NOTE — Progress Notes (Signed)
8:59 AM Dr. [name] notified of nausea, Rx phoned in by [facility].

## 2014-04-18 NOTE — Patient Instructions (Signed)

## 2014-04-20 ENCOUNTER — Telehealth: Payer: Self-pay | Admitting: *Deleted

## 2014-04-20 LAB — KAPPA/LAMBDA LIGHT CHAINS
Kappa free light chain: 6.17 mg/dL — ABNORMAL HIGH (ref 0.33–1.94)
Kappa:Lambda Ratio: 1.86 — ABNORMAL HIGH (ref 0.26–1.65)
LAMBDA FREE LGHT CHN: 3.32 mg/dL — AB (ref 0.57–2.63)

## 2014-04-20 LAB — PROTEIN ELECTROPHORESIS, SERUM, WITH REFLEX
ALPHA-1-GLOBULIN: 3.2 % (ref 2.9–4.9)
Albumin ELP: 50.7 % — ABNORMAL LOW (ref 55.8–66.1)
Alpha-2-Globulin: 11.6 % (ref 7.1–11.8)
BETA 2: 6.4 % (ref 3.2–6.5)
BETA GLOBULIN: 4.1 % — AB (ref 4.7–7.2)
GAMMA GLOBULIN: 24 % — AB (ref 11.1–18.8)
M-SPIKE, %: 0.93 g/dL
Total Protein, Serum Electrophoresis: 8 g/dL (ref 6.0–8.3)

## 2014-04-20 LAB — IGG, IGA, IGM
IgA: 375 mg/dL (ref 68–379)
IgG (Immunoglobin G), Serum: 1960 mg/dL — ABNORMAL HIGH (ref 650–1600)
IgM, Serum: 67 mg/dL (ref 41–251)

## 2014-04-20 LAB — IFE INTERPRETATION

## 2014-04-20 LAB — VITAMIN D 25 HYDROXY (VIT D DEFICIENCY, FRACTURES): VIT D 25 HYDROXY: 34 ng/mL (ref 30–100)

## 2014-04-20 NOTE — Telephone Encounter (Signed)
-----   Message from Volanda Napoleon, MD sent at 04/19/2014 12:36 PM EST ----- Call - is he on vit D???

## 2014-04-20 NOTE — Telephone Encounter (Signed)
Called patient at Dr. [name] Request to see how much Vitamin D patient is taking.  Patient is taking 50000  Units weekly.  [place] with Dr. [name]

## 2014-05-13 ENCOUNTER — Emergency Department (HOSPITAL_COMMUNITY)
Admission: EM | Admit: 2014-05-13 | Discharge: 2014-05-13 | Disposition: A | Discharge status: Home / Self Care | Payer: Medicaid Other | Attending: Emergency Medicine | Admitting: Emergency Medicine

## 2014-05-13 ENCOUNTER — Encounter (HOSPITAL_COMMUNITY): Payer: Self-pay | Admitting: *Deleted

## 2014-05-13 DIAGNOSIS — R319 Hematuria, unspecified: Secondary | ICD-10-CM

## 2014-05-13 DIAGNOSIS — F419 Anxiety disorder, unspecified: Secondary | ICD-10-CM | POA: Insufficient documentation

## 2014-05-13 DIAGNOSIS — Z8619 Personal history of other infectious and parasitic diseases: Secondary | ICD-10-CM | POA: Insufficient documentation

## 2014-05-13 DIAGNOSIS — I10 Essential (primary) hypertension: Secondary | ICD-10-CM | POA: Diagnosis not present

## 2014-05-13 DIAGNOSIS — E119 Type 2 diabetes mellitus without complications: Secondary | ICD-10-CM | POA: Insufficient documentation

## 2014-05-13 DIAGNOSIS — Z8579 Personal history of other malignant neoplasms of lymphoid, hematopoietic and related tissues: Secondary | ICD-10-CM | POA: Diagnosis not present

## 2014-05-13 DIAGNOSIS — Z7982 Long term (current) use of aspirin: Secondary | ICD-10-CM | POA: Insufficient documentation

## 2014-05-13 DIAGNOSIS — Z8719 Personal history of other diseases of the digestive system: Secondary | ICD-10-CM | POA: Diagnosis not present

## 2014-05-13 DIAGNOSIS — Z862 Personal history of diseases of the blood and blood-forming organs and certain disorders involving the immune mechanism: Secondary | ICD-10-CM | POA: Insufficient documentation

## 2014-05-13 DIAGNOSIS — Z791 Long term (current) use of non-steroidal anti-inflammatories (NSAID): Secondary | ICD-10-CM | POA: Diagnosis not present

## 2014-05-13 DIAGNOSIS — M1990 Unspecified osteoarthritis, unspecified site: Secondary | ICD-10-CM | POA: Diagnosis not present

## 2014-05-13 DIAGNOSIS — Z79899 Other long term (current) drug therapy: Secondary | ICD-10-CM | POA: Diagnosis not present

## 2014-05-13 DIAGNOSIS — Z87448 Personal history of other diseases of urinary system: Secondary | ICD-10-CM | POA: Insufficient documentation

## 2014-05-13 LAB — I-STAT CHEM 8, ED
BUN: 42 mg/dL — ABNORMAL HIGH (ref 6–23)
Calcium, Ion: 1.16 mmol/L (ref 1.13–1.30)
Chloride: 101 mmol/L (ref 96–112)
Creatinine, Ser: 2.4 mg/dL — ABNORMAL HIGH (ref 0.50–1.35)
Glucose, Bld: 181 mg/dL — ABNORMAL HIGH (ref 70–99)
HEMATOCRIT: 44 % (ref 39.0–52.0)
HEMOGLOBIN: 15 g/dL (ref 13.0–17.0)
Potassium: 3.6 mmol/L (ref 3.5–5.1)
Sodium: 139 mmol/L (ref 135–145)
TCO2: 23 mmol/L (ref 0–100)

## 2014-05-13 LAB — CBC WITH DIFFERENTIAL/PLATELET
BASOS ABS: 0 10*3/uL (ref 0.0–0.1)
BASOS PCT: 1 % (ref 0–1)
EOS ABS: 0.2 10*3/uL (ref 0.0–0.7)
Eosinophils Relative: 2 % (ref 0–5)
HCT: 37.8 % — ABNORMAL LOW (ref 39.0–52.0)
Hemoglobin: 12.2 g/dL — ABNORMAL LOW (ref 13.0–17.0)
LYMPHS PCT: 19 % (ref 12–46)
Lymphs Abs: 1.4 10*3/uL (ref 0.7–4.0)
MCH: 25.7 pg — ABNORMAL LOW (ref 26.0–34.0)
MCHC: 32.3 g/dL (ref 30.0–36.0)
MCV: 79.7 fL (ref 78.0–100.0)
MONOS PCT: 6 % (ref 3–12)
Monocytes Absolute: 0.4 10*3/uL (ref 0.1–1.0)
Neutro Abs: 5.5 10*3/uL (ref 1.7–7.7)
Neutrophils Relative %: 72 % (ref 43–77)
PLATELETS: 230 10*3/uL (ref 150–400)
RBC: 4.74 MIL/uL (ref 4.22–5.81)
RDW: 14.7 % (ref 11.5–15.5)
WBC: 7.5 10*3/uL (ref 4.0–10.5)

## 2014-05-13 LAB — URINALYSIS, ROUTINE W REFLEX MICROSCOPIC
BILIRUBIN URINE: NEGATIVE
Glucose, UA: NEGATIVE mg/dL
Ketones, ur: NEGATIVE mg/dL
Nitrite: NEGATIVE
PH: 7 (ref 5.0–8.0)
Protein, ur: NEGATIVE mg/dL
SPECIFIC GRAVITY, URINE: 1.016 (ref 1.005–1.030)
UROBILINOGEN UA: 0.2 mg/dL (ref 0.0–1.0)

## 2014-05-13 LAB — URINE MICROSCOPIC-ADD ON

## 2014-05-13 NOTE — ED Notes (Signed)
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  Dr. Maryan Rued states she will see and perform u/s of bladder soon, of which we inform the pt. And his wife.  I assure him we will be happy to give whatever Dr. Maryan Rued orders.

## 2014-05-13 NOTE — Discharge Instructions (Signed)
Hematuria °Hematuria is blood in your urine. It can be caused by a bladder infection, kidney infection, prostate infection, kidney stone, or cancer of your urinary tract. Infections can usually be treated with medicine, and a kidney stone usually will pass through your urine. If neither of these is the cause of your hematuria, further workup to find out the reason may be needed. °It is very important that you tell your health care provider about any blood you see in your urine, even if the blood stops without treatment or happens without causing pain. Blood in your urine that happens and then stops and then happens again can be a symptom of a very serious condition. Also, pain is not a symptom in the initial stages of many urinary cancers. °HOME CARE INSTRUCTIONS  °· Drink lots of fluid, 3-4 quarts a day. If you have been diagnosed with an infection, cranberry juice is especially recommended, in addition to large amounts of water. °· Avoid caffeine, tea, and carbonated beverages because they tend to irritate the bladder. °· Avoid alcohol because it may irritate the prostate. °· Take all medicines as directed by your health care provider. °· If you were prescribed an antibiotic medicine, finish it all even if you start to feel better. °· If you have been diagnosed with a kidney stone, follow your health care provider's instructions regarding straining your urine to catch the stone. °· Empty your bladder often. Avoid holding urine for long periods of time. °· After a bowel movement, women should cleanse front to back. Use each tissue only once. °· Empty your bladder before and after sexual intercourse if you are a male. °SEEK MEDICAL CARE IF: °· You develop back pain. °· You have a fever. °· You have a feeling of sickness in your stomach (nausea) or vomiting. °· Your symptoms are not better in 3 days. Return sooner if you are getting worse. °SEEK IMMEDIATE MEDICAL CARE IF:  °· You develop severe vomiting and are  unable to keep the medicine down. °· You develop severe back or abdominal pain despite taking your medicines. °· You begin passing a large amount of blood or clots in your urine. °· You feel extremely weak or faint, or you pass out. °MAKE SURE YOU:  °· Understand these instructions. °· Will watch your condition. °· Will get help right away if you are not doing well or get worse. °Document Released: 02/09/2005 Document Revised: 06/26/2013 Document Reviewed: 10/10/2012 °Exit[employer]® Patient Information ©2015 [employer]. This information is not intended to replace advice given to you by your health care provider. Make sure you discuss any questions you have with your health care provider. ° °

## 2014-05-13 NOTE — ED Notes (Signed)
Pt reports hematuria since Thursday night; reports burning with urination. Pt reports before it started he felt "like I was passing a stone, like something was blocking that urine and after I passed it every time I urinate there is blood".

## 2014-05-13 NOTE — ED Provider Notes (Addendum)
CSN: [id number]     Arrival date & time 05/13/14  0957 History   First MD Initiated Contact with Patient 05/13/14 1010     Chief Complaint  Patient presents with  . Hematuria     (Consider location/radiation/quality/duration/timing/severity/associated sxs/prior Treatment) Patient is a 64 y.o. male presenting with hematuria. The history is provided by the patient.  Hematuria This is a new (Blood is at the end of his urinary stream. No clots) problem. The current episode started 2 days ago. The problem occurs constantly. The problem has not changed since onset.Associated symptoms comments: Dysuria, frequency, urgency. No fever, vomiting. Patient has had a decreased appetite for months now but nothing changed in the last 2 days. Patient also states he has bone pain and joint pain everywhere from the multiple myeloma but no new pain in the last 2 days.. Exacerbated by: Urinating. Nothing relieves the symptoms. He has tried nothing for the symptoms. The treatment provided no relief.    Past Medical History  Diagnosis Date  . Multiple myeloma   . Diabetes mellitus without complication   . Multiple myeloma   . Hypertension   . Renal insufficiency   . Diverticulosis   . Hemorrhoids   . Type 2 diabetes mellitus   . Anxiety disorder   . Urinary retention   . Anemia   . Diabetic neuropathy   . Constipation   . Herpes zoster   . Arthritis   . Hyperlipidemia   . Myeloma 12/31/2010  . Arthralgia of multiple joints 12/31/2010  . Anemia, iron deficiency 03/12/2011  . 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 Hypotestosteronism 03/17/2012   Past Surgical History  Procedure Laterality Date  . Knee surgery    . Knee arthroscopy      left  . Portacath placement    . Cataract extraction      bi-lateral  . Transurethral resection of prostate  01/2011    Dr. at [facility] in [place]  . Colonoscopy    . Esophagogastroduodenoscopy    . Colonoscopy  07/13/2011    Procedure: COLONOSCOPY;   Surgeon: Ladene Artist, MD,FACG;  Location: [facility];  Service: Endoscopy;  Laterality: N/A;   Family History  Problem Relation Age of Onset  . Breast cancer Sister   . Breast cancer Mother   . Diabetes Sister   . Bone cancer Father   . Colon cancer Neg Hx   . Heart disease Sister   . Heart disease Mother   . Heart disease Daughter   . Throat cancer Brother   . Stomach cancer Brother    History  Substance Use Topics  . Smoking status: Never Smoker   . Smokeless tobacco: Never Used     Comment: never used tobacco  . Alcohol Use: No    Review of Systems  Genitourinary: Positive for hematuria.  All other systems reviewed and are negative.     Allergies  Iodine and Iodine  Home Medications   Prior to Admission medications   Medication Sig Start Date End Date Taking? Authorizing Provider  amLODipine (NORVASC) 10 MG tablet Take 10 mg by mouth daily.    Historical Provider, MD  aspirin 81 MG tablet Take 81 mg by mouth daily.    Historical Provider, MD  CARAFATE 1 GM/10ML suspension Take 1 g by mouth 4 (four) times daily.  07/28/11   Historical Provider, MD  diazepam (VALIUM) 5 MG tablet Take 5 mg by mouth every 4 (four) hours as needed for  anxiety.    Historical Provider, MD  ergocalciferol (VITAMIN D2) 50000 UNITS capsule Take 50,000 Units by mouth once a week.      Historical Provider, MD  furosemide (LASIX) 40 MG tablet Take 40 mg by mouth.    Historical Provider, MD  glipiZIDE (GLUCOTROL) 5 MG tablet Take 5 mg by mouth daily before breakfast.    Historical Provider, MD  Hypromellose (NATURAL BALANCE TEARS OP) Apply to eye every morning.    Historical Provider, MD  meclizine (ANTIVERT) 50 MG tablet Take 0.5 tablets (25 mg total) by mouth 4 (four) times daily as needed for dizziness. Patient not taking: Reported on 04/18/2014 12/13/11   Virgel Manifold, MD  meloxicam (MOBIC) 15 MG tablet Take 1 tablet (15 mg total) by mouth daily. 02/09/14   Volanda Napoleon, MD   methocarbamol (ROBAXIN) 500 MG tablet Take 500 mg by mouth 2 (two) times daily as needed for muscle spasms.     Historical Provider, MD  omeprazole (PRILOSEC) 20 MG capsule Take 1 capsule (20 mg total) by mouth daily. 08/30/13   Ladene Artist, MD  OxyCODONE (OXYCONTIN) 80 mg T12A 12 hr tablet Take 80 mg by mouth 3 (three) times daily.    Historical Provider, MD  oxyCODONE-acetaminophen (PERCOCET) 10-325 MG per tablet Take 1 tablet by mouth every 6 (six) hours as needed. Pain    Historical Provider, MD  polyethylene glycol (MIRALAX / GLYCOLAX) packet Take 17 g by mouth daily.    Historical Provider, MD  Polyvinyl Alcohol-Povidone (CLEAR EYES ALL SEASONS) 5-6 MG/ML SOLN Apply 2 drops to eye daily as needed (drye eyes.).    Historical Provider, MD  potassium chloride SA (K-DUR,KLOR-CON) 20 MEQ tablet Take 20 mEq by mouth as needed (takes with furosemide).     Historical Provider, MD  prochlorperazine (COMPAZINE) 10 MG tablet Take 1 tablet (10 mg total) by mouth every 6 (six) hours as needed for nausea or vomiting. 04/18/14   Volanda Napoleon, MD  Tamsulosin HCl (FLOMAX) 0.4 MG CAPS Take 0.4 mg by mouth daily.     Historical Provider, MD  triamterene-hydrochlorothiazide (MAXZIDE-25) 37.5-25 MG per tablet Take 1 tablet by mouth daily.      Historical Provider, MD  Vitamin D, Ergocalciferol, (DRISDOL) 50000 UNITS CAPS capsule Take 50,000 Units by mouth every 7 (seven) days.    Historical Provider, MD   BP 173/75 mmHg  Pulse 89  Temp(Src) 98 F (36.7 C) (Oral)  Resp 18  SpO2 100% Physical Exam  Constitutional: He is oriented to person, place, and time. He appears well-developed and well-nourished. No distress.  HENT:  Head: Normocephalic and atraumatic.  Mouth/Throat: Oropharynx is clear and moist.  Eyes: Conjunctivae and EOM are normal. Pupils are equal, round, and reactive to light.  Neck: Normal range of motion. Neck supple.  Cardiovascular: Normal rate, regular rhythm and intact distal pulses.    No murmur heard. Pulmonary/Chest: Effort normal and breath sounds normal. No respiratory distress. He has no wheezes. He has no rales.  Abdominal: Soft. He exhibits no distension. There is tenderness in the suprapubic area. There is no rebound, no guarding and no CVA tenderness.  Musculoskeletal: Normal range of motion. He exhibits no edema or tenderness.  Neurological: He is alert and oriented to person, place, and time.  Skin: Skin is warm and dry. No rash noted. No erythema.  Psychiatric: He has a normal mood and affect. His behavior is normal.  Nursing note and vitals reviewed.   ED Course  Procedures (including critical care time) Labs Review Labs Reviewed  URINALYSIS, ROUTINE W REFLEX MICROSCOPIC - Abnormal; Notable for the following:    Hgb urine dipstick MODERATE (*)    Leukocytes, UA TRACE (*)    All other components within normal limits  CBC WITH DIFFERENTIAL/PLATELET - Abnormal; Notable for the following:    Hemoglobin 12.2 (*)    HCT 37.8 (*)    MCH 25.7 (*)    All other components within normal limits  I-STAT CHEM 8, ED - Abnormal; Notable for the following:    BUN 42 (*)    Creatinine, Ser 2.40 (*)    Glucose, Bld 181 (*)    All other components within normal limits  URINE MICROSCOPIC-ADD ON    Imaging Review No results found.   EKG Interpretation None      MDM   Final diagnoses:  Hematuria   patient with a history of multiple myeloma and ureteral stricture who presents today with 2 day history of dysuria, frequency, urgency and hematuria. Patient denies any recent urologic procedure but states a proximally 2-3 months ago he had a ureteral dilation.  Patient denies fever but has had a decreased appetite which is not new in the last few days. He has no flank pains or findings concerning for pyelonephritis. Patient has not received chemotherapy in over 3 months.  UA, CBC, chem 8 pending  12:55 PM Renal function unchanged. UA today shows blood but no  evidence of infection. Bedside ultrasound shows 200-300 mL of urine in the bladder the patient has no complaints of retention has no discomfort or pain at this time. Recommended patient follow-up with his urologist for further evaluation and possible repeat cystoscopy  Blanchie Dessert, MD 05/13/14 [place], MD 05/13/14 1258

## 2014-05-13 NOTE — ED Notes (Signed)
Pt aware of need for urine sample, will alert staff when he can void

## 2014-05-16 ENCOUNTER — Ambulatory Visit (HOSPITAL_BASED_OUTPATIENT_CLINIC_OR_DEPARTMENT_OTHER): Payer: Medicaid Other | Admitting: Hematology & Oncology

## 2014-05-16 ENCOUNTER — Ambulatory Visit (HOSPITAL_BASED_OUTPATIENT_CLINIC_OR_DEPARTMENT_OTHER): Payer: Medicaid Other | Admitting: Lab

## 2014-05-16 ENCOUNTER — Ambulatory Visit (HOSPITAL_BASED_OUTPATIENT_CLINIC_OR_DEPARTMENT_OTHER): Payer: Medicaid Other

## 2014-05-16 ENCOUNTER — Encounter: Payer: Self-pay | Admitting: Hematology & Oncology

## 2014-05-16 ENCOUNTER — Other Ambulatory Visit: Payer: Self-pay | Admitting: Emergency Medicine

## 2014-05-16 VITALS — BP 167/87 | HR 89 | Temp 98.3°F | Resp 18 | Ht 69.0 in | Wt 199.0 lb

## 2014-05-16 DIAGNOSIS — N189 Chronic kidney disease, unspecified: Secondary | ICD-10-CM

## 2014-05-16 DIAGNOSIS — E291 Testicular hypofunction: Secondary | ICD-10-CM | POA: Diagnosis not present

## 2014-05-16 DIAGNOSIS — D631 Anemia in chronic kidney disease: Secondary | ICD-10-CM | POA: Diagnosis not present

## 2014-05-16 DIAGNOSIS — C9000 Multiple myeloma not having achieved remission: Secondary | ICD-10-CM

## 2014-05-16 DIAGNOSIS — E349 Endocrine disorder, unspecified: Secondary | ICD-10-CM

## 2014-05-16 DIAGNOSIS — D509 Iron deficiency anemia, unspecified: Secondary | ICD-10-CM

## 2014-05-16 LAB — CBC WITH DIFFERENTIAL (CANCER CENTER ONLY)
BASO#: 0 10*3/uL (ref 0.0–0.2)
BASO%: 0.6 % (ref 0.0–2.0)
EOS%: 2.1 % (ref 0.0–7.0)
Eosinophils Absolute: 0.1 10*3/uL (ref 0.0–0.5)
HCT: 33.8 % — ABNORMAL LOW (ref 38.7–49.9)
HGB: 11.2 g/dL — ABNORMAL LOW (ref 13.0–17.1)
LYMPH#: 1.7 10*3/uL (ref 0.9–3.3)
LYMPH%: 25.3 % (ref 14.0–48.0)
MCH: 26 pg — AB (ref 28.0–33.4)
MCHC: 33.1 g/dL (ref 32.0–35.9)
MCV: 79 fL — AB (ref 82–98)
MONO#: 0.5 10*3/uL (ref 0.1–0.9)
MONO%: 7.9 % (ref 0.0–13.0)
NEUT#: 4.4 10*3/uL (ref 1.5–6.5)
NEUT%: 64.1 % (ref 40.0–80.0)
Platelets: 197 10*3/uL (ref 145–400)
RBC: 4.3 10*6/uL (ref 4.20–5.70)
RDW: 14.4 % (ref 11.1–15.7)
WBC: 6.8 10*3/uL (ref 4.0–10.0)

## 2014-05-16 LAB — IRON AND TIBC CHCC
%SAT: 47 % (ref 20–55)
Iron: 98 ug/dL (ref 42–163)
TIBC: 209 ug/dL (ref 202–409)
UIBC: 111 ug/dL — AB (ref 117–376)

## 2014-05-16 LAB — CMP (CANCER CENTER ONLY)
ALBUMIN: 3.8 g/dL (ref 3.3–5.5)
ALT(SGPT): 19 U/L (ref 10–47)
AST: 32 U/L (ref 11–38)
Alkaline Phosphatase: 81 U/L (ref 26–84)
BUN: 46 mg/dL — AB (ref 7–22)
CALCIUM: 9.4 mg/dL (ref 8.0–10.3)
CHLORIDE: 100 meq/L (ref 98–108)
CO2: 30 mEq/L (ref 18–33)
Creat: 2.5 mg/dl — ABNORMAL HIGH (ref 0.6–1.2)
GLUCOSE: 131 mg/dL — AB (ref 73–118)
POTASSIUM: 3.5 meq/L (ref 3.3–4.7)
Sodium: 141 mEq/L (ref 128–145)
TOTAL PROTEIN: 9.1 g/dL — AB (ref 6.4–8.1)
Total Bilirubin: 0.5 mg/dl (ref 0.20–1.60)

## 2014-05-16 LAB — FERRITIN CHCC

## 2014-05-16 MED ORDER — TESTOSTERONE CYPIONATE 200 MG/ML IM SOLN
300.0000 mg | INTRAMUSCULAR | Status: DC
  Administered 2014-05-16: 300 mg via INTRAMUSCULAR

## 2014-05-16 MED ORDER — SODIUM CHLORIDE 0.9 % IV SOLN
60.0000 mg | Freq: Once | INTRAVENOUS | Status: AC
  Administered 2014-05-16: 60 mg via INTRAVENOUS
  Filled 2014-05-16: qty 10

## 2014-05-16 MED ORDER — SODIUM CHLORIDE 0.9 % IJ SOLN
10.0000 mL | INTRAMUSCULAR | Status: DC | PRN
  Administered 2014-05-16: 10 mL via INTRAVENOUS
  Filled 2014-05-16: qty 10

## 2014-05-16 MED ORDER — SODIUM CHLORIDE 0.9 % IV SOLN
Freq: Once | INTRAVENOUS | Status: AC
  Administered 2014-05-16: 10:00:00 via INTRAVENOUS

## 2014-05-16 MED ORDER — HEPARIN SOD (PORK) LOCK FLUSH 100 UNIT/ML IV SOLN
500.0000 [IU] | Freq: Once | INTRAVENOUS | Status: AC
  Administered 2014-05-16: 500 [IU] via INTRAVENOUS
  Filled 2014-05-16: qty 5

## 2014-05-16 MED ORDER — TESTOSTERONE CYPIONATE 200 MG/ML IM SOLN
INTRAMUSCULAR | Status: AC
  Filled 2014-05-16: qty 2

## 2014-05-16 NOTE — Progress Notes (Signed)
Hematology and Oncology Follow Up Visit  [name] [id number] 14-May-1950 64 y.o. 05/16/2014   Principle Diagnosis:  1. IgG kappa myeloma. 2. Anemia secondary to renal insufficiency. 3. Intermittent iron-deficiency anemia. 4. Hypotestosteronemia  Current Therapy:   1. Aredia 60 mg IV q.2 months  2. Aranesp 300 mcg subcu as needed for hemoglobin less than 11. 3. Depo-Testosterone 300 mg q.2 weeks. 4. IV iron as indicated.     Interim History:  Mr.  [name] is back for followup. He's been having some problems with what appears to be kidney stones. He had some hematuria. He went to the emergency room back on March 20. He did not have any scans done. He had ultrasound which showed some slight urinary retention. He was to follow-up with his urologist.  His last lab work noted on him back in March showed a monocles spike of 0.93 g/dL. His IgG level was 1960 mg/dL. This is holding fairly steady. His levels do tend to fluctuate. His ferritin was 2600. Again, think a lot of this is from his fibromyalgia His last testosterone level was 288   As such that is doing well.  His blood sugars have been a problem area and he tries his best to keep these under control. Medications:  Current outpatient prescriptions:  .  amLODipine (NORVASC) 10 MG tablet, Take 10 mg by mouth daily., Disp: , Rfl:  .  CARAFATE 1 GM/10ML suspension, Take 1 g by mouth 4 (four) times daily. , Disp: , Rfl:  .  diazepam (VALIUM) 5 MG tablet, Take 5 mg by mouth every 4 (four) hours as needed for anxiety., Disp: , Rfl:  .  furosemide (LASIX) 40 MG tablet, Take 40 mg by mouth., Disp: , Rfl:  .  glipiZIDE (GLUCOTROL) 5 MG tablet, Take 5 mg by mouth daily before breakfast., Disp: , Rfl:  .  Hypromellose (NATURAL BALANCE TEARS OP), Apply 1 drop to eye every morning. , Disp: , Rfl:  .  meclizine (ANTIVERT) 50 MG tablet, Take 0.5 tablets (25 mg total) by mouth 4 (four) times daily as needed for dizziness., Disp: 30 tablet, Rfl:  0 .  meloxicam (MOBIC) 15 MG tablet, Take 1 tablet (15 mg total) by mouth daily., Disp: 30 tablet, Rfl: 2 .  methocarbamol (ROBAXIN) 500 MG tablet, Take 500 mg by mouth 2 (two) times daily as needed for muscle spasms. , Disp: , Rfl:  .  omeprazole (PRILOSEC) 20 MG capsule, Take 1 capsule (20 mg total) by mouth daily., Disp: 30 capsule, Rfl: 11 .  OxyCODONE (OXYCONTIN) 80 mg T12A 12 hr tablet, Take 80 mg by mouth 3 (three) times daily., Disp: , Rfl:  .  oxyCODONE-acetaminophen (PERCOCET) 10-325 MG per tablet, Take 1 tablet by mouth every 6 (six) hours as needed. Pain, Disp: , Rfl:  .  Polyvinyl Alcohol-Povidone (CLEAR EYES ALL SEASONS) 5-6 MG/ML SOLN, Apply 2 drops to eye 3 (three) times daily as needed (drye eyes.). , Disp: , Rfl:  .  potassium chloride SA (K-DUR,KLOR-CON) 20 MEQ tablet, Take 20 mEq by mouth 2 (two) times daily as needed (takes with furosemide). , Disp: , Rfl:  .  prochlorperazine (COMPAZINE) 10 MG tablet, Take 1 tablet (10 mg total) by mouth every 6 (six) hours as needed for nausea or vomiting., Disp: 60 tablet, Rfl: 2 .  Tamsulosin HCl (FLOMAX) 0.4 MG CAPS, Take 0.4 mg by mouth daily. , Disp: , Rfl:  .  triamterene-hydrochlorothiazide (MAXZIDE-25) 37.5-25 MG per tablet, Take 1 tablet by mouth  daily.  , Disp: , Rfl:  .  Vitamin D, Ergocalciferol, (DRISDOL) 50000 UNITS CAPS capsule, Take 50,000 Units by mouth every 7 (seven) days., Disp: , Rfl:  .  polyethylene glycol (MIRALAX / GLYCOLAX) packet, Take 17 g by mouth daily., Disp: , Rfl:  .  [DISCONTINUED] potassium chloride (KLOR-CON) 20 MEQ packet, Take 20 mEq by mouth daily. , Disp: , Rfl:   Allergies:  Allergies  Allergen Reactions  . Iodine Rash  . Iodine Anxiety    Didn't feel right "instructed not to take per MD--something with his port"    Past Medical History, Surgical history, Social history, and Family History were reviewed and updated.  Review of Systems: As above  Physical Exam:  height is 5\' 9"  (1.753 m) and  weight is 199 lb (90.266 kg). His oral temperature is 98.3 F (36.8 C). His blood pressure is 167/87 and his pulse is 89. His respiration is 18.   His head and neck exam shows no ocular or oral lesions. He has no adenopathy in the neck. His thyroid is non-palpable. Lungs are clear. Cardiac exam regular rate and rhythm with no murmurs, rubs or bruits.. Abdomen is soft. He has good bowel sounds. There is no fluid wave. There is no palpable liver or spleen tip. Back exam shows no tenderness in the lumbar spine. There is no paravertebral muscle spasms. . He has slightly decreased range of motion of the lower back. Extremities shows no clubbing cyanosis or edema. He has decent strength in his legs. Skin exam shows no rashes. Neurological exam is nonfocal.  Lab Results  Component Value Date   WBC 6.8 05/16/2014   HGB 11.2* 05/16/2014   HCT 33.8* 05/16/2014   MCV 79* 05/16/2014   PLT 197 05/16/2014     Chemistry      Component Value Date/Time   NA 141 05/16/2014 0842   NA 139 05/13/2014 1057   K 3.5 05/16/2014 0842   K 3.6 05/13/2014 1057   CL 100 05/16/2014 0842   CL 101 05/13/2014 1057   CO2 30 05/16/2014 0842   CO2 29 03/21/2014 0826   BUN 46* 05/16/2014 0842   BUN 42* 05/13/2014 1057   CREATININE 2.5* 05/16/2014 0842   CREATININE 2.40* 05/13/2014 1057      Component Value Date/Time   CALCIUM 9.4 05/16/2014 0842   CALCIUM 9.0 03/21/2014 0826   ALKPHOS 81 05/16/2014 0842   ALKPHOS 69 03/21/2014 0826   AST 32 05/16/2014 0842   AST 22 03/21/2014 0826   ALT 19 05/16/2014 0842   ALT 19 03/21/2014 0826   BILITOT 0.50 05/16/2014 0842   BILITOT 0.4 03/21/2014 0826         Impression and Plan: [name] is a 64 year old gentleman. He has multiple medical problems. I still do not think that myeloma is an issue. He has fibromyalgia. He has diabetes. He has low testosterone.  I we restart the Aredia right now. It is been over 2 months as he had dental work done. I think it is safe  to go ahead and give him the Aredia..  We will go ahead and give him testosterone today.  I'll plan to see him back in about 2 months. Volanda Napoleon, MD 3/23/20166:37 PM

## 2014-05-16 NOTE — Patient Instructions (Signed)
Pamidronate injection What is this medicine? PAMIDRONATE (pa mi DROE nate) slows calcium loss from bones. It is used to treat high calcium blood levels from cancer or Paget's disease. It is also used to treat bone pain and prevent fractures from certain cancers that have spread to the bone. This medicine may be used for other purposes; ask your health care provider or pharmacist if you have questions. COMMON BRAND NAME(S): Aredia What should I tell my health care provider before I take this medicine? They need to know if you have any of these conditions: -aspirin-sensitive asthma -dental disease -kidney disease -an unusual or allergic reaction to pamidronate, other medicines, foods, dyes, or preservatives -pregnant or trying to get pregnant -breast-feeding How should I use this medicine? This medicine is for infusion into a vein. It is given by a health care professional in a hospital or clinic setting. Talk to your pediatrician regarding the use of this medicine in children. This medicine is not approved for use in children. Overdosage: If you think you have taken too much of this medicine contact a poison control center or emergency room at once. NOTE: This medicine is only for you. Do not share this medicine with others. What if I miss a dose? This does not apply. What may interact with this medicine? -certain antibiotics given by injection -medicines for inflammation or pain like ibuprofen, naproxen -some diuretics like bumetanide, furosemide -cyclosporine -parathyroid hormone -tacrolimus -teriparatide -thalidomide This list may not describe all possible interactions. Give your health care provider a list of all the medicines, herbs, non-prescription drugs, or dietary supplements you use. Also tell them if you smoke, drink alcohol, or use illegal drugs. Some items may interact with your medicine. What should I watch for while using this medicine? Visit your doctor or health care  professional for regular checkups. It may be some time before you see the benefit from this medicine. Do not stop taking your medicine unless your doctor tells you to. Your doctor may order blood tests or other tests to see how you are doing. Women should inform their doctor if they wish to become pregnant or think they might be pregnant. There is a potential for serious side effects to an unborn child. Talk to your health care professional or pharmacist for more information. You should make sure that you get enough calcium and vitamin D while you are taking this medicine. Discuss the foods you eat and the vitamins you take with your health care professional. Some people who take this medicine have severe bone, joint, and/or muscle pain. This medicine may also increase your risk for a broken thigh bone. Tell your doctor right away if you have pain in your upper leg or groin. Tell your doctor if you have any pain that does not go away or that gets worse. What side effects may I notice from receiving this medicine? Side effects that you should report to your doctor or health care professional as soon as possible: -allergic reactions like skin rash, itching or hives, swelling of the face, lips, or tongue -black or tarry stools -changes in vision -eye inflammation, pain -high blood pressure -jaw pain, especially burning or cramping -muscle weakness -numb, tingling pain -swelling of feet or hands -trouble passing urine or change in the amount of urine -unable to move easily Side effects that usually do not require medical attention (report to your doctor or health care professional if they continue or are bothersome): -bone, joint, or muscle pain -constipation -dizzy, drowsy -  fever -headache -loss of appetite -nausea, vomiting -pain at site where injected This list may not describe all possible side effects. Call your doctor for medical advice about side effects. You may report side effects to  FDA at 1-800-FDA-1088. Where should I keep my medicine? This drug is given in a hospital or clinic and will not be stored at home. NOTE: This sheet is a summary. It may not cover all possible information. If you have questions about this medicine, talk to your doctor, pharmacist, or health care provider.  2015, [employer]. (2010-08-08 08:49:49)

## 2014-05-18 LAB — SPEP & IFE WITH QIG
ALPHA-1-GLOBULIN: 0.3 g/dL (ref 0.2–0.3)
ALPHA-2-GLOBULIN: 1 g/dL — AB (ref 0.5–0.9)
Abnormal Protein Band1: 0.9 g/dL
Albumin ELP: 4.3 g/dL (ref 3.8–4.8)
Beta 2: 0.5 g/dL (ref 0.2–0.5)
Beta Globulin: 0.4 g/dL (ref 0.4–0.6)
Gamma Globulin: 2 g/dL — ABNORMAL HIGH (ref 0.8–1.7)
IGM, SERUM: 83 mg/dL (ref 41–251)
IgA: 397 mg/dL — ABNORMAL HIGH (ref 68–379)
IgG (Immunoglobin G), Serum: 2110 mg/dL — ABNORMAL HIGH (ref 650–1600)
TOTAL PROTEIN, SERUM ELECTROPHOR: 8.4 g/dL — AB (ref 6.1–8.1)

## 2014-05-18 LAB — KAPPA/LAMBDA LIGHT CHAINS
KAPPA FREE LGHT CHN: 6.68 mg/dL — AB (ref 0.33–1.94)
KAPPA LAMBDA RATIO: 2.25 — AB (ref 0.26–1.65)
LAMBDA FREE LGHT CHN: 2.97 mg/dL — AB (ref 0.57–2.63)

## 2014-05-18 LAB — VITAMIN D 25 HYDROXY (VIT D DEFICIENCY, FRACTURES): VIT D 25 HYDROXY: 41 ng/mL (ref 30–100)

## 2014-06-13 ENCOUNTER — Ambulatory Visit (HOSPITAL_BASED_OUTPATIENT_CLINIC_OR_DEPARTMENT_OTHER): Payer: Medicaid Other

## 2014-06-13 VITALS — BP 143/64 | HR 68 | Temp 98.0°F | Resp 18

## 2014-06-13 DIAGNOSIS — D509 Iron deficiency anemia, unspecified: Secondary | ICD-10-CM

## 2014-06-13 DIAGNOSIS — N189 Chronic kidney disease, unspecified: Secondary | ICD-10-CM

## 2014-06-13 DIAGNOSIS — E349 Endocrine disorder, unspecified: Secondary | ICD-10-CM

## 2014-06-13 DIAGNOSIS — E291 Testicular hypofunction: Secondary | ICD-10-CM

## 2014-06-13 DIAGNOSIS — C9000 Multiple myeloma not having achieved remission: Secondary | ICD-10-CM

## 2014-06-13 DIAGNOSIS — D631 Anemia in chronic kidney disease: Secondary | ICD-10-CM

## 2014-06-13 LAB — FERRITIN CHCC: Ferritin: 2181 ng/ml — ABNORMAL HIGH (ref 22–316)

## 2014-06-13 LAB — CBC WITH DIFFERENTIAL (CANCER CENTER ONLY)
BASO#: 0.1 10*3/uL (ref 0.0–0.2)
BASO%: 0.8 % (ref 0.0–2.0)
EOS%: 2.9 % (ref 0.0–7.0)
Eosinophils Absolute: 0.2 10*3/uL (ref 0.0–0.5)
HCT: 36.1 % — ABNORMAL LOW (ref 38.7–49.9)
HGB: 11.9 g/dL — ABNORMAL LOW (ref 13.0–17.1)
LYMPH#: 1.6 10*3/uL (ref 0.9–3.3)
LYMPH%: 25.8 % (ref 14.0–48.0)
MCH: 25.9 pg — AB (ref 28.0–33.4)
MCHC: 33 g/dL (ref 32.0–35.9)
MCV: 79 fL — ABNORMAL LOW (ref 82–98)
MONO#: 0.5 10*3/uL (ref 0.1–0.9)
MONO%: 7.8 % (ref 0.0–13.0)
NEUT#: 3.8 10*3/uL (ref 1.5–6.5)
NEUT%: 62.7 % (ref 40.0–80.0)
PLATELETS: 239 10*3/uL (ref 145–400)
RBC: 4.59 10*6/uL (ref 4.20–5.70)
RDW: 14 % (ref 11.1–15.7)
WBC: 6.1 10*3/uL (ref 4.0–10.0)

## 2014-06-13 LAB — CMP (CANCER CENTER ONLY)
ALT: 32 U/L (ref 10–47)
AST: 37 U/L (ref 11–38)
Albumin: 4.1 g/dL (ref 3.3–5.5)
Alkaline Phosphatase: 72 U/L (ref 26–84)
BUN, Bld: 39 mg/dL — ABNORMAL HIGH (ref 7–22)
CO2: 28 mEq/L (ref 18–33)
Calcium: 9 mg/dL (ref 8.0–10.3)
Chloride: 99 mEq/L (ref 98–108)
Creat: 2.8 mg/dl — ABNORMAL HIGH (ref 0.6–1.2)
Glucose, Bld: 173 mg/dL — ABNORMAL HIGH (ref 73–118)
Potassium: 3.6 mEq/L (ref 3.3–4.7)
SODIUM: 141 meq/L (ref 128–145)
TOTAL PROTEIN: 9.2 g/dL — AB (ref 6.4–8.1)
Total Bilirubin: 0.5 mg/dl (ref 0.20–1.60)

## 2014-06-13 LAB — IRON AND TIBC CHCC
%SAT: 56 % — ABNORMAL HIGH (ref 20–55)
Iron: 115 ug/dL (ref 42–163)
TIBC: 205 ug/dL (ref 202–409)
UIBC: 90 ug/dL — ABNORMAL LOW (ref 117–376)

## 2014-06-13 LAB — LACTATE DEHYDROGENASE: LDH: 169 U/L (ref 94–250)

## 2014-06-13 MED ORDER — TESTOSTERONE CYPIONATE 200 MG/ML IM SOLN
300.0000 mg | INTRAMUSCULAR | Status: DC
  Administered 2014-06-13: 300 mg via INTRAMUSCULAR

## 2014-06-13 MED ORDER — TESTOSTERONE CYPIONATE 200 MG/ML IM SOLN
INTRAMUSCULAR | Status: AC
  Filled 2014-06-13: qty 2

## 2014-06-13 NOTE — Patient Instructions (Signed)

## 2014-06-15 LAB — KAPPA/LAMBDA LIGHT CHAINS
KAPPA LAMBDA RATIO: 1.69 — AB (ref 0.26–1.65)
Kappa free light chain: 5.37 mg/dL — ABNORMAL HIGH (ref 0.33–1.94)
Lambda Free Lght Chn: 3.17 mg/dL — ABNORMAL HIGH (ref 0.57–2.63)

## 2014-06-15 LAB — PROTEIN ELECTROPHORESIS, SERUM, WITH REFLEX
ALPHA-1-GLOBULIN: 0.4 g/dL — AB (ref 0.2–0.3)
Abnormal Protein Band1: 0.7 g/dL
Albumin ELP: 4 g/dL (ref 3.8–4.8)
Alpha-2-Globulin: 0.9 g/dL (ref 0.5–0.9)
Beta 2: 0.5 g/dL (ref 0.2–0.5)
Beta Globulin: 0.4 g/dL (ref 0.4–0.6)
Gamma Globulin: 1.8 g/dL — ABNORMAL HIGH (ref 0.8–1.7)
TOTAL PROTEIN, SERUM ELECTROPHOR: 8.1 g/dL (ref 6.1–8.1)

## 2014-06-15 LAB — IGG, IGA, IGM
IgA: 391 mg/dL — ABNORMAL HIGH (ref 68–379)
IgG (Immunoglobin G), Serum: 1890 mg/dL — ABNORMAL HIGH (ref 650–1600)
IgM, Serum: 75 mg/dL (ref 41–251)

## 2014-06-15 LAB — TESTOSTERONE: Testosterone: 188 ng/dL — ABNORMAL LOW (ref 300–890)

## 2014-06-15 LAB — IFE INTERPRETATION

## 2014-07-10 ENCOUNTER — Other Ambulatory Visit: Payer: Self-pay | Admitting: *Deleted

## 2014-07-10 DIAGNOSIS — C9000 Multiple myeloma not having achieved remission: Secondary | ICD-10-CM

## 2014-07-11 ENCOUNTER — Other Ambulatory Visit (HOSPITAL_BASED_OUTPATIENT_CLINIC_OR_DEPARTMENT_OTHER): Payer: Medicare Other

## 2014-07-11 ENCOUNTER — Encounter: Payer: Self-pay | Admitting: Hematology & Oncology

## 2014-07-11 ENCOUNTER — Ambulatory Visit (HOSPITAL_BASED_OUTPATIENT_CLINIC_OR_DEPARTMENT_OTHER): Payer: Medicare Other | Admitting: Hematology & Oncology

## 2014-07-11 ENCOUNTER — Ambulatory Visit (HOSPITAL_BASED_OUTPATIENT_CLINIC_OR_DEPARTMENT_OTHER): Payer: Medicare Other

## 2014-07-11 VITALS — BP 159/76 | HR 93 | Temp 98.3°F | Resp 18 | Ht 69.0 in | Wt 194.0 lb

## 2014-07-11 DIAGNOSIS — C9000 Multiple myeloma not having achieved remission: Secondary | ICD-10-CM

## 2014-07-11 DIAGNOSIS — E119 Type 2 diabetes mellitus without complications: Secondary | ICD-10-CM

## 2014-07-11 DIAGNOSIS — M797 Fibromyalgia: Secondary | ICD-10-CM

## 2014-07-11 DIAGNOSIS — D649 Anemia, unspecified: Secondary | ICD-10-CM

## 2014-07-11 DIAGNOSIS — E291 Testicular hypofunction: Secondary | ICD-10-CM | POA: Diagnosis not present

## 2014-07-11 DIAGNOSIS — N289 Disorder of kidney and ureter, unspecified: Secondary | ICD-10-CM | POA: Diagnosis not present

## 2014-07-11 DIAGNOSIS — D509 Iron deficiency anemia, unspecified: Secondary | ICD-10-CM | POA: Diagnosis not present

## 2014-07-11 DIAGNOSIS — E349 Endocrine disorder, unspecified: Secondary | ICD-10-CM

## 2014-07-11 LAB — CBC WITH DIFFERENTIAL (CANCER CENTER ONLY)
BASO#: 0 10*3/uL (ref 0.0–0.2)
BASO%: 0.5 % (ref 0.0–2.0)
EOS ABS: 0.2 10*3/uL (ref 0.0–0.5)
EOS%: 3 % (ref 0.0–7.0)
HEMATOCRIT: 36.3 % — AB (ref 38.7–49.9)
HEMOGLOBIN: 12.2 g/dL — AB (ref 13.0–17.1)
LYMPH#: 1.7 10*3/uL (ref 0.9–3.3)
LYMPH%: 20.6 % (ref 14.0–48.0)
MCH: 26 pg — AB (ref 28.0–33.4)
MCHC: 33.6 g/dL (ref 32.0–35.9)
MCV: 77 fL — ABNORMAL LOW (ref 82–98)
MONO#: 0.6 10*3/uL (ref 0.1–0.9)
MONO%: 7 % (ref 0.0–13.0)
NEUT#: 5.6 10*3/uL (ref 1.5–6.5)
NEUT%: 68.9 % (ref 40.0–80.0)
PLATELETS: 243 10*3/uL (ref 145–400)
RBC: 4.69 10*6/uL (ref 4.20–5.70)
RDW: 14.3 % (ref 11.1–15.7)
WBC: 8.1 10*3/uL (ref 4.0–10.0)

## 2014-07-11 LAB — CMP (CANCER CENTER ONLY)
ALT(SGPT): 23 U/L (ref 10–47)
AST: 25 U/L (ref 11–38)
Albumin: 4.1 g/dL (ref 3.3–5.5)
Alkaline Phosphatase: 63 U/L (ref 26–84)
BILIRUBIN TOTAL: 0.5 mg/dL (ref 0.20–1.60)
BUN, Bld: 40 mg/dL — ABNORMAL HIGH (ref 7–22)
CALCIUM: 9.7 mg/dL (ref 8.0–10.3)
CHLORIDE: 96 meq/L — AB (ref 98–108)
CO2: 31 meq/L (ref 18–33)
CREATININE: 2.6 mg/dL — AB (ref 0.6–1.2)
GLUCOSE: 194 mg/dL — AB (ref 73–118)
Potassium: 3.5 mEq/L (ref 3.3–4.7)
SODIUM: 140 meq/L (ref 128–145)
TOTAL PROTEIN: 9.2 g/dL — AB (ref 6.4–8.1)

## 2014-07-11 LAB — FERRITIN CHCC: Ferritin: 1975 ng/ml — ABNORMAL HIGH (ref 22–316)

## 2014-07-11 LAB — IRON AND TIBC CHCC
%SAT: 39 % (ref 20–55)
Iron: 84 ug/dL (ref 42–163)
TIBC: 216 ug/dL (ref 202–409)
UIBC: 133 ug/dL (ref 117–376)

## 2014-07-11 MED ORDER — SODIUM CHLORIDE 0.9 % IV SOLN
INTRAVENOUS | Status: DC
  Administered 2014-07-11: 10:00:00 via INTRAVENOUS

## 2014-07-11 MED ORDER — KETOROLAC TROMETHAMINE 15 MG/ML IJ SOLN
INTRAMUSCULAR | Status: AC
  Filled 2014-07-11: qty 2

## 2014-07-11 MED ORDER — SODIUM CHLORIDE 0.9 % IJ SOLN
10.0000 mL | Freq: Once | INTRAMUSCULAR | Status: AC
  Administered 2014-07-11: 10 mL via INTRAVENOUS
  Filled 2014-07-11: qty 10

## 2014-07-11 MED ORDER — SODIUM CHLORIDE 0.9 % IV SOLN
60.0000 mg | Freq: Once | INTRAVENOUS | Status: AC
  Administered 2014-07-11: 60 mg via INTRAVENOUS
  Filled 2014-07-11: qty 10

## 2014-07-11 MED ORDER — KETOROLAC TROMETHAMINE 30 MG/ML IJ SOLN
30.0000 mg | Freq: Once | INTRAMUSCULAR | Status: DC
  Filled 2014-07-11: qty 1

## 2014-07-11 MED ORDER — HEPARIN SOD (PORK) LOCK FLUSH 100 UNIT/ML IV SOLN
500.0000 [IU] | Freq: Once | INTRAVENOUS | Status: AC
  Administered 2014-07-11: 500 [IU] via INTRAVENOUS
  Filled 2014-07-11: qty 5

## 2014-07-11 MED ORDER — TESTOSTERONE CYPIONATE 200 MG/ML IM SOLN
INTRAMUSCULAR | Status: AC
  Filled 2014-07-11: qty 2

## 2014-07-11 MED ORDER — SODIUM CHLORIDE 0.9 % IJ SOLN
10.0000 mL | INTRAMUSCULAR | Status: DC | PRN
  Administered 2014-07-11: 10 mL via INTRAVENOUS
  Filled 2014-07-11: qty 10

## 2014-07-11 MED ORDER — KETOROLAC TROMETHAMINE 15 MG/ML IJ SOLN
30.0000 mg | Freq: Once | INTRAMUSCULAR | Status: AC
  Administered 2014-07-11: 30 mg via INTRAVENOUS

## 2014-07-11 MED ORDER — TESTOSTERONE CYPIONATE 200 MG/ML IM SOLN
300.0000 mg | Freq: Once | INTRAMUSCULAR | Status: AC
  Administered 2014-07-11: 300 mg via INTRAMUSCULAR

## 2014-07-11 NOTE — Progress Notes (Signed)
Hematology and Oncology Follow Up Visit  Iasiah Donnally [id number] [birth year] 64 y.o. 07/11/2014   Principle Diagnosis:  1. IgG kappa myeloma. 2. Anemia secondary to renal insufficiency. 3. Intermittent iron-deficiency anemia. 4. Hypotestosteronemia  Current Therapy:   1. Aredia 60 mg IV q.2 months  2. Aranesp 300 mcg subcu as needed for hemoglobin less than 11. 3. Depo-Testosterone 300 mg q.2 weeks. 4. IV iron as indicated.     Interim History:  Mr.  Mahon is back for followup. He actually looks quite good right now. Since we last saw him in March, he has been doing okay. He had a good birthday last week. His last iron studies done in April showed a ferritin of 2100. His iron saturation was 56%. As such, iron deficiency really is not a problem for him.  His last monoclonal spike back in April was also stable. He does have an IgG Kappa spike. When we last checked, his spike was 0.7 g/dL which is stable. His IgG level was 1890 mg/dL. His Kappa light chain was 5.37 mg/dL.   He does have fibromyalgia. So far, this has not flared up on him. He does see rheumatology.  His last testosterone level was 188. We will give him a dose today.  His biggest issues deal with his diabetes and his blood sugars. They have been a problem area and he tries his best to keep these under control. Medications:  Current outpatient prescriptions:  .  amLODipine (NORVASC) 10 MG tablet, Take 10 mg by mouth daily., Disp: , Rfl:  .  CARAFATE 1 GM/10ML suspension, Take 1 g by mouth 4 (four) times daily. , Disp: , Rfl:  .  diazepam (VALIUM) 5 MG tablet, Take 5 mg by mouth every 4 (four) hours as needed for anxiety., Disp: , Rfl:  .  furosemide (LASIX) 40 MG tablet, Take 40 mg by mouth., Disp: , Rfl:  .  glipiZIDE (GLUCOTROL) 5 MG tablet, Take 5 mg by mouth daily before breakfast., Disp: , Rfl:  .  Hypromellose (NATURAL BALANCE TEARS OP), Apply 1 drop to eye every morning. , Disp: , Rfl:  .  meclizine  (ANTIVERT) 50 MG tablet, Take 0.5 tablets (25 mg total) by mouth 4 (four) times daily as needed for dizziness., Disp: 30 tablet, Rfl: 0 .  meloxicam (MOBIC) 15 MG tablet, Take 1 tablet (15 mg total) by mouth daily., Disp: 30 tablet, Rfl: 2 .  methocarbamol (ROBAXIN) 500 MG tablet, Take 500 mg by mouth 2 (two) times daily as needed for muscle spasms. , Disp: , Rfl:  .  omeprazole (PRILOSEC) 20 MG capsule, Take 1 capsule (20 mg total) by mouth daily., Disp: 30 capsule, Rfl: 11 .  OxyCODONE (OXYCONTIN) 80 mg T12A 12 hr tablet, Take 80 mg by mouth 3 (three) times daily., Disp: , Rfl:  .  oxyCODONE-acetaminophen (PERCOCET) 10-325 MG per tablet, Take 1 tablet by mouth every 6 (six) hours as needed. Pain, Disp: , Rfl:  .  polyethylene glycol (MIRALAX / GLYCOLAX) packet, Take 17 g by mouth daily., Disp: , Rfl:  .  Polyvinyl Alcohol-Povidone (CLEAR EYES ALL SEASONS) 5-6 MG/ML SOLN, Apply 2 drops to eye 3 (three) times daily as needed (drye eyes.). , Disp: , Rfl:  .  potassium chloride SA (K-DUR,KLOR-CON) 20 MEQ tablet, Take 20 mEq by mouth 2 (two) times daily as needed (takes with furosemide). , Disp: , Rfl:  .  prochlorperazine (COMPAZINE) 10 MG tablet, Take 1 tablet (10 mg total) by mouth every 6 (  six) hours as needed for nausea or vomiting., Disp: 60 tablet, Rfl: 2 .  Tamsulosin HCl (FLOMAX) 0.4 MG CAPS, Take 0.4 mg by mouth daily. , Disp: , Rfl:  .  triamterene-hydrochlorothiazide (MAXZIDE-25) 37.5-25 MG per tablet, Take 1 tablet by mouth daily.  , Disp: , Rfl:  .  Vitamin D, Ergocalciferol, (DRISDOL) 50000 UNITS CAPS capsule, Take 50,000 Units by mouth every 7 (seven) days., Disp: , Rfl:  .  [DISCONTINUED] potassium chloride (KLOR-CON) 20 MEQ packet, Take 20 mEq by mouth daily. , Disp: , Rfl:  No current facility-administered medications for this visit.  Facility-Administered Medications Ordered in Other Visits:  .  ketorolac (TORADOL) 30 MG/ML injection 30 mg, 30 mg, Intravenous, Once, Volanda Napoleon,  MD .  pamidronate (AREDIA) 60 mg in sodium chloride 0.9 % 500 mL IVPB, 60 mg, Intravenous, Once, Volanda Napoleon, MD .  testosterone cypionate (DEPOTESTOTERONE CYPIONATE) injection 300 mg, 300 mg, Intramuscular, Q14 Days, Volanda Napoleon, MD  Allergies:  Allergies  Allergen Reactions  . Iodine Rash  . Iodine Anxiety    Didn't feel right "instructed not to take per MD--something with his port"    Past Medical History, Surgical history, Social history, and Family History were reviewed and updated.  Review of Systems: As above  Physical Exam:  height is 5\' 9"  (1.753 m) and weight is 194 lb (87.998 kg). His oral temperature is 98.3 F (36.8 C). His blood pressure is 159/76 and his pulse is 93. His respiration is 18.   His head and neck exam shows no ocular or oral lesions. He has no adenopathy in the neck. His thyroid is non-palpable. Lungs are clear. Cardiac exam regular rate and rhythm with no murmurs, rubs or bruits.. Abdomen is soft. He has good bowel sounds. There is no fluid wave. There is no palpable liver or spleen tip. Back exam shows no tenderness in the lumbar spine. There is no paravertebral muscle spasms. . He has slightly decreased range of motion of the lower back. Extremities shows no clubbing cyanosis or edema. He has decent strength in his legs. Skin exam shows no rashes. Neurological exam is nonfocal.  Lab Results  Component Value Date   WBC 8.1 07/11/2014   HGB 12.2* 07/11/2014   HCT 36.3* 07/11/2014   MCV 77* 07/11/2014   PLT 243 07/11/2014     Chemistry      Component Value Date/Time   NA 140 07/11/2014 0855   NA 139 05/13/2014 1057   NA 137 08/23/2012 2105   K 3.5 07/11/2014 0855   K 3.6 05/13/2014 1057   K 4.0 08/23/2012 2105   CL 96* 07/11/2014 0855   CL 101 05/13/2014 1057   CL 101 08/23/2012 2105   CO2 31 07/11/2014 0855   CO2 29 03/21/2014 0826   CO2 32 08/23/2012 2105   BUN 40* 07/11/2014 0855   BUN 42* 05/13/2014 1057   BUN 23* 08/23/2012  2105   CREATININE 2.6* 07/11/2014 0855   CREATININE 2.40* 05/13/2014 1057   CREATININE 2.53* 08/23/2012 2105      Component Value Date/Time   CALCIUM 9.7 07/11/2014 0855   CALCIUM 9.0 03/21/2014 0826   CALCIUM 8.7 08/23/2012 2105   ALKPHOS 63 07/11/2014 0855   ALKPHOS 69 03/21/2014 0826   ALKPHOS 101 08/23/2012 2105   AST 25 07/11/2014 0855   AST 22 03/21/2014 0826   AST 27 08/23/2012 2105   ALT 23 07/11/2014 0855   ALT 19 03/21/2014 0826  ALT 21 08/23/2012 2105   BILITOT 0.50 07/11/2014 0855   BILITOT 0.4 03/21/2014 0826         Impression and Plan: Mr. Schrotenboer is a 64 year old gentleman. He has multiple medical problems. I still do not think that myeloma is an issue. He has fibromyalgia. He has diabetes. He has low testosterone.  We will go ahead and give him Aredia. He gets it every 2 months.  I think the testosterone is helping him and also helping with his anemia.  We will go ahead and give him testosterone today.  I'll plan to see him back in about 2 months. Volanda Napoleon, MD 5/18/20169:57 AM

## 2014-07-11 NOTE — Patient Instructions (Signed)
Testosterone This test is used to determine if your testosterone level is abnormal. This could be used to explain difficulty getting an erection (erectile dysfunction), inability of your partner to get pregnant (infertility), premature or delayed puberty if you are male, or the appearance of masculine physical features if you are male. PREPARATION FOR TEST A blood sample is obtained by inserting a needle into a vein in the arm. NORMAL FINDINGS  Free Testosterone: 0.3-2 pg/mL  % Free Testosterone: 0.1%-0.3% Total Testosterone:  7 mos-9 yrs (Tanner Stage I)  Male: Less than 30 ng/dL  Male: Less than 30 ng/dL  10-13 yrs (Tanner Stage II)  Male: Less than 300 ng/dL  Male: Less than 40 ng/dL  14-15 yrs (Tanner Stage III)  Male: 170-540 ng/dL  Male: Less than 60 ng/dL  16-19 yrs (Tanner Stage IV, V)  Male: 250-910 ng/dL  Male: Less than 70 ng/dL  20 yrs and over  Male: [phone] ng/dL  Male: Less than 70 ng/dL Ranges for normal findings may vary among different laboratories and hospitals. You should always check with your doctor after having lab work or other tests done to discuss the meaning of your test results and whether your values are considered within normal limits. MEANING OF TEST  Your caregiver will go over the test results with you and discuss the importance and meaning of your results, as well as treatment options and the need for additional tests if necessary. OBTAINING THE TEST RESULTS It is your responsibility to obtain your test results. Ask the lab or department performing the test when and how you will get your results. Document Released: 02/27/2004 Document Revised: 05/04/2011 Document Reviewed: 01/22/2008 [facility] Patient Information 2014 [place], [place]. Pamidronate injection What is this medicine? PAMIDRONATE (pa mi DROE nate) slows calcium loss from bones. It is used to treat high calcium blood levels from cancer or Paget's disease. It is  also used to treat bone pain and prevent fractures from certain cancers that have spread to the bone. This medicine may be used for other purposes; ask your health care provider or pharmacist if you have questions. COMMON BRAND NAME(S): Aredia What should I tell my health care provider before I take this medicine? They need to know if you have any of these conditions: -aspirin-sensitive asthma -dental disease -kidney disease -an unusual or allergic reaction to pamidronate, other medicines, foods, dyes, or preservatives -pregnant or trying to get pregnant -breast-feeding How should I use this medicine? This medicine is for infusion into a vein. It is given by a health care professional in a hospital or clinic setting. Talk to your pediatrician regarding the use of this medicine in children. This medicine is not approved for use in children. Overdosage: If you think you have taken too much of this medicine contact a poison control center or emergency room at once. NOTE: This medicine is only for you. Do not share this medicine with others. What if I miss a dose? This does not apply. What may interact with this medicine? -certain antibiotics given by injection -medicines for inflammation or pain like ibuprofen, naproxen -some diuretics like bumetanide, furosemide -cyclosporine -parathyroid hormone -tacrolimus -teriparatide -thalidomide This list may not describe all possible interactions. Give your health care provider a list of all the medicines, herbs, non-prescription drugs, or dietary supplements you use. Also tell them if you smoke, drink alcohol, or use illegal drugs. Some items may interact with your medicine. What should I watch for while using this medicine? Visit your doctor or  health care professional for regular checkups. It may be some time before you see the benefit from this medicine. Do not stop taking your medicine unless your doctor tells you to. Your doctor may order  blood tests or other tests to see how you are doing. Women should inform their doctor if they wish to become pregnant or think they might be pregnant. There is a potential for serious side effects to an unborn child. Talk to your health care professional or pharmacist for more information. You should make sure that you get enough calcium and vitamin D while you are taking this medicine. Discuss the foods you eat and the vitamins you take with your health care professional. Some people who take this medicine have severe bone, joint, and/or muscle pain. This medicine may also increase your risk for a broken thigh bone. Tell your doctor right away if you have pain in your upper leg or groin. Tell your doctor if you have any pain that does not go away or that gets worse. What side effects may I notice from receiving this medicine? Side effects that you should report to your doctor or health care professional as soon as possible: -allergic reactions like skin rash, itching or hives, swelling of the face, lips, or tongue -black or tarry stools -changes in vision -eye inflammation, pain -high blood pressure -jaw pain, especially burning or cramping -muscle weakness -numb, tingling pain -swelling of feet or hands -trouble passing urine or change in the amount of urine -unable to move easily Side effects that usually do not require medical attention (report to your doctor or health care professional if they continue or are bothersome): -bone, joint, or muscle pain -constipation -dizzy, drowsy -fever -headache -loss of appetite -nausea, vomiting -pain at site where injected This list may not describe all possible side effects. Call your doctor for medical advice about side effects. You may report side effects to FDA at 1-800-FDA-1088. Where should I keep my medicine? This drug is given in a hospital or clinic and will not be stored at home. NOTE: This sheet is a summary. It may not cover all  possible information. If you have questions about this medicine, talk to your doctor, pharmacist, or health care provider.  2014, Elsevier/Gold Standard. (2010-08-08 08:49:49)

## 2014-07-13 LAB — KAPPA/LAMBDA LIGHT CHAINS
KAPPA FREE LGHT CHN: 5.23 mg/dL — AB (ref 0.33–1.94)
Kappa:Lambda Ratio: 1.62 (ref 0.26–1.65)
LAMBDA FREE LGHT CHN: 3.23 mg/dL — AB (ref 0.57–2.63)

## 2014-07-13 LAB — SPEP & IFE WITH QIG
ABNORMAL PROTEIN BAND1: 0.7 g/dL
Albumin ELP: 4.2 g/dL (ref 3.8–4.8)
Alpha-1-Globulin: 0.3 g/dL (ref 0.2–0.3)
Alpha-2-Globulin: 1.1 g/dL — ABNORMAL HIGH (ref 0.5–0.9)
Beta 2: 0.5 g/dL (ref 0.2–0.5)
Beta Globulin: 0.4 g/dL (ref 0.4–0.6)
GAMMA GLOBULIN: 2 g/dL — AB (ref 0.8–1.7)
IGA: 388 mg/dL — AB (ref 68–379)
IGG (IMMUNOGLOBIN G), SERUM: 2100 mg/dL — AB (ref 650–1600)
IGM, SERUM: 77 mg/dL (ref 41–251)
TOTAL PROTEIN, SERUM ELECTROPHOR: 8.5 g/dL — AB (ref 6.1–8.1)

## 2014-07-13 LAB — TESTOSTERONE: TESTOSTERONE: 199 ng/dL — AB (ref 300–890)

## 2014-07-13 LAB — LACTATE DEHYDROGENASE: LDH: 184 U/L (ref 94–250)

## 2014-07-13 LAB — VITAMIN D 25 HYDROXY (VIT D DEFICIENCY, FRACTURES): VIT D 25 HYDROXY: 42 ng/mL (ref 30–100)

## 2014-08-08 ENCOUNTER — Ambulatory Visit: Payer: Self-pay

## 2014-08-08 ENCOUNTER — Other Ambulatory Visit: Payer: Self-pay

## 2014-08-10 ENCOUNTER — Ambulatory Visit (HOSPITAL_BASED_OUTPATIENT_CLINIC_OR_DEPARTMENT_OTHER): Payer: Medicare Other

## 2014-08-10 VITALS — BP 130/63 | Temp 98.1°F | Resp 20

## 2014-08-10 DIAGNOSIS — E291 Testicular hypofunction: Secondary | ICD-10-CM | POA: Diagnosis not present

## 2014-08-10 DIAGNOSIS — E349 Endocrine disorder, unspecified: Secondary | ICD-10-CM

## 2014-08-10 MED ORDER — TESTOSTERONE CYPIONATE 200 MG/ML IM SOLN
INTRAMUSCULAR | Status: AC
  Filled 2014-08-10: qty 2

## 2014-08-10 MED ORDER — TESTOSTERONE CYPIONATE 200 MG/ML IM SOLN
300.0000 mg | INTRAMUSCULAR | Status: DC
  Administered 2014-08-10: 300 mg via INTRAMUSCULAR

## 2014-08-10 NOTE — Patient Instructions (Signed)

## 2014-09-05 ENCOUNTER — Ambulatory Visit (HOSPITAL_BASED_OUTPATIENT_CLINIC_OR_DEPARTMENT_OTHER): Payer: Medicare Other | Admitting: Family

## 2014-09-05 ENCOUNTER — Other Ambulatory Visit: Payer: Self-pay | Admitting: Family

## 2014-09-05 ENCOUNTER — Ambulatory Visit (HOSPITAL_BASED_OUTPATIENT_CLINIC_OR_DEPARTMENT_OTHER): Payer: Medicare Other

## 2014-09-05 ENCOUNTER — Other Ambulatory Visit (HOSPITAL_BASED_OUTPATIENT_CLINIC_OR_DEPARTMENT_OTHER): Payer: Medicare Other

## 2014-09-05 VITALS — BP 144/72 | HR 81 | Temp 98.1°F | Resp 20 | Wt 191.5 lb

## 2014-09-05 DIAGNOSIS — N189 Chronic kidney disease, unspecified: Secondary | ICD-10-CM

## 2014-09-05 DIAGNOSIS — C9000 Multiple myeloma not having achieved remission: Secondary | ICD-10-CM | POA: Diagnosis present

## 2014-09-05 DIAGNOSIS — E349 Endocrine disorder, unspecified: Secondary | ICD-10-CM

## 2014-09-05 DIAGNOSIS — D509 Iron deficiency anemia, unspecified: Secondary | ICD-10-CM

## 2014-09-05 DIAGNOSIS — E291 Testicular hypofunction: Secondary | ICD-10-CM

## 2014-09-05 DIAGNOSIS — D649 Anemia, unspecified: Secondary | ICD-10-CM

## 2014-09-05 DIAGNOSIS — M2550 Pain in unspecified joint: Secondary | ICD-10-CM

## 2014-09-05 DIAGNOSIS — D631 Anemia in chronic kidney disease: Secondary | ICD-10-CM

## 2014-09-05 DIAGNOSIS — N289 Disorder of kidney and ureter, unspecified: Secondary | ICD-10-CM | POA: Diagnosis not present

## 2014-09-05 LAB — IRON AND TIBC CHCC
%SAT: 30 % (ref 20–55)
IRON: 65 ug/dL (ref 42–163)
TIBC: 215 ug/dL (ref 202–409)
UIBC: 150 ug/dL (ref 117–376)

## 2014-09-05 LAB — CMP (CANCER CENTER ONLY)
ALT(SGPT): 24 U/L (ref 10–47)
AST: 28 U/L (ref 11–38)
Albumin: 4 g/dL (ref 3.3–5.5)
Alkaline Phosphatase: 71 U/L (ref 26–84)
BILIRUBIN TOTAL: 0.6 mg/dL (ref 0.20–1.60)
BUN: 50 mg/dL — AB (ref 7–22)
CO2: 28 mEq/L (ref 18–33)
Calcium: 9 mg/dL (ref 8.0–10.3)
Chloride: 95 mEq/L — ABNORMAL LOW (ref 98–108)
Creat: 2.9 mg/dl — ABNORMAL HIGH (ref 0.6–1.2)
GLUCOSE: 219 mg/dL — AB (ref 73–118)
Potassium: 3.2 mEq/L — ABNORMAL LOW (ref 3.3–4.7)
Sodium: 134 mEq/L (ref 128–145)
TOTAL PROTEIN: 8.6 g/dL — AB (ref 6.4–8.1)

## 2014-09-05 LAB — CBC WITH DIFFERENTIAL (CANCER CENTER ONLY)
BASO#: 0.1 10*3/uL (ref 0.0–0.2)
BASO%: 0.6 % (ref 0.0–2.0)
EOS%: 1.9 % (ref 0.0–7.0)
Eosinophils Absolute: 0.2 10*3/uL (ref 0.0–0.5)
HEMATOCRIT: 34.2 % — AB (ref 38.7–49.9)
HEMOGLOBIN: 11.7 g/dL — AB (ref 13.0–17.1)
LYMPH#: 1.6 10*3/uL (ref 0.9–3.3)
LYMPH%: 17.8 % (ref 14.0–48.0)
MCH: 26 pg — ABNORMAL LOW (ref 28.0–33.4)
MCHC: 34.2 g/dL (ref 32.0–35.9)
MCV: 76 fL — AB (ref 82–98)
MONO#: 0.7 10*3/uL (ref 0.1–0.9)
MONO%: 7.6 % (ref 0.0–13.0)
NEUT#: 6.3 10*3/uL (ref 1.5–6.5)
NEUT%: 72.1 % (ref 40.0–80.0)
Platelets: 221 10*3/uL (ref 145–400)
RBC: 4.5 10*6/uL (ref 4.20–5.70)
RDW: 15.1 % (ref 11.1–15.7)
WBC: 8.7 10*3/uL (ref 4.0–10.0)

## 2014-09-05 LAB — FERRITIN CHCC

## 2014-09-05 MED ORDER — MORPHINE SULFATE 4 MG/ML IJ SOLN
INTRAMUSCULAR | Status: AC
  Filled 2014-09-05: qty 1

## 2014-09-05 MED ORDER — TESTOSTERONE CYPIONATE 200 MG/ML IM SOLN
300.0000 mg | INTRAMUSCULAR | Status: DC
  Administered 2014-09-05: 300 mg via INTRAMUSCULAR

## 2014-09-05 MED ORDER — MORPHINE SULFATE 4 MG/ML IJ SOLN
4.0000 mg | Freq: Once | INTRAMUSCULAR | Status: AC
  Administered 2014-09-05: 4 mg via INTRAVENOUS

## 2014-09-05 MED ORDER — TESTOSTERONE CYPIONATE 200 MG/ML IM SOLN
INTRAMUSCULAR | Status: AC
  Filled 2014-09-05: qty 2

## 2014-09-05 MED ORDER — SODIUM CHLORIDE 0.9 % IV SOLN
60.0000 mg | Freq: Once | INTRAVENOUS | Status: AC
  Administered 2014-09-05: 60 mg via INTRAVENOUS
  Filled 2014-09-05: qty 10

## 2014-09-05 NOTE — Patient Instructions (Signed)
Pamidronate injection What is this medicine? PAMIDRONATE (pa mi DROE nate) slows calcium loss from bones. It is used to treat high calcium blood levels from cancer or Paget's disease. It is also used to treat bone pain and prevent fractures from certain cancers that have spread to the bone. This medicine may be used for other purposes; ask your health care provider or pharmacist if you have questions. COMMON BRAND NAME(S): Aredia What should I tell my health care provider before I take this medicine? They need to know if you have any of these conditions: -aspirin-sensitive asthma -dental disease -kidney disease -an unusual or allergic reaction to pamidronate, other medicines, foods, dyes, or preservatives -pregnant or trying to get pregnant -breast-feeding How should I use this medicine? This medicine is for infusion into a vein. It is given by a health care professional in a hospital or clinic setting. Talk to your pediatrician regarding the use of this medicine in children. This medicine is not approved for use in children. Overdosage: If you think you have taken too much of this medicine contact a poison control center or emergency room at once. NOTE: This medicine is only for you. Do not share this medicine with others. What if I miss a dose? This does not apply. What may interact with this medicine? -certain antibiotics given by injection -medicines for inflammation or pain like ibuprofen, naproxen -some diuretics like bumetanide, furosemide -cyclosporine -parathyroid hormone -tacrolimus -teriparatide -thalidomide This list may not describe all possible interactions. Give your health care provider a list of all the medicines, herbs, non-prescription drugs, or dietary supplements you use. Also tell them if you smoke, drink alcohol, or use illegal drugs. Some items may interact with your medicine. What should I watch for while using this medicine? Visit your doctor or health care  professional for regular checkups. It may be some time before you see the benefit from this medicine. Do not stop taking your medicine unless your doctor tells you to. Your doctor may order blood tests or other tests to see how you are doing. Women should inform their doctor if they wish to become pregnant or think they might be pregnant. There is a potential for serious side effects to an unborn child. Talk to your health care professional or pharmacist for more information. You should make sure that you get enough calcium and vitamin D while you are taking this medicine. Discuss the foods you eat and the vitamins you take with your health care professional. Some people who take this medicine have severe bone, joint, and/or muscle pain. This medicine may also increase your risk for a broken thigh bone. Tell your doctor right away if you have pain in your upper leg or groin. Tell your doctor if you have any pain that does not go away or that gets worse. What side effects may I notice from receiving this medicine? Side effects that you should report to your doctor or health care professional as soon as possible: -allergic reactions like skin rash, itching or hives, swelling of the face, lips, or tongue -black or tarry stools -changes in vision -eye inflammation, pain -high blood pressure -jaw pain, especially burning or cramping -muscle weakness -numb, tingling pain -swelling of feet or hands -trouble passing urine or change in the amount of urine -unable to move easily Side effects that usually do not require medical attention (report to your doctor or health care professional if they continue or are bothersome): -bone, joint, or muscle pain -constipation -dizzy, drowsy -  fever -headache -loss of appetite -nausea, vomiting -pain at site where injected This list may not describe all possible side effects. Call your doctor for medical advice about side effects. You may report side effects to  FDA at 1-800-FDA-1088. Where should I keep my medicine? This drug is given in a hospital or clinic and will not be stored at home. NOTE: This sheet is a summary. It may not cover all possible information. If you have questions about this medicine, talk to your doctor, pharmacist, or health care provider.  2015, Elsevier/Gold Standard. (2010-08-08 08:49:49)   Testosterone injection What is this medicine? TESTOSTERONE (tes TOS ter one) is the main male hormone. It supports normal male development such as muscle growth, facial hair, and deep voice. It is used in males to treat low testosterone levels. This medicine may be used for other purposes; ask your health care provider or pharmacist if you have questions. COMMON BRAND NAME(S): Andro-L.A., Aveed, Delatestryl, Depo-Testosterone, Virilon What should I tell my health care provider before I take this medicine? They need to know if you have any of these conditions: -breast cancer -diabetes -heart disease -kidney disease -liver disease -lung disease -prostate cancer, enlargement -an unusual or allergic reaction to testosterone, other medicines, foods, dyes, or preservatives -pregnant or trying to get pregnant -breast-feeding How should I use this medicine? This medicine is for injection into a muscle. It is usually given by a health care professional in a hospital or clinic setting. Contact your pediatrician regarding the use of this medicine in children. While this medicine may be prescribed for children as young as 50 years of age for selected conditions, precautions do apply. Overdosage: If you think you have taken too much of this medicine contact a poison control center or emergency room at once. NOTE: This medicine is only for you. Do not share this medicine with others. What if I miss a dose? Try not to miss a dose. Your doctor or health care professional will tell you when your next injection is due. Notify the office if you are  unable to keep an appointment. What may interact with this medicine? -medicines for diabetes -medicines that treat or prevent blood clots like warfarin -oxyphenbutazone -propranolol -steroid medicines like prednisone or cortisone This list may not describe all possible interactions. Give your health care provider a list of all the medicines, herbs, non-prescription drugs, or dietary supplements you use. Also tell them if you smoke, drink alcohol, or use illegal drugs. Some items may interact with your medicine. What should I watch for while using this medicine? Visit your doctor or health care professional for regular checks on your progress. They will need to check the level of testosterone in your blood. This medicine is only approved for use in men who have low levels of testosterone related to certain medical conditions. Heart attacks and strokes have been reported with the use of this medicine. Notify your doctor or health care professional and seek emergency treatment if you develop breathing problems; changes in vision; confusion; chest pain or chest tightness; sudden arm pain; severe, sudden headache; trouble speaking or understanding; sudden numbness or weakness of the face, arm or leg; loss of balance or coordination. Talk to your doctor about the risks and benefits of this medicine. This medicine may affect blood sugar levels. If you have diabetes, check with your doctor or health care professional before you change your diet or the dose of your diabetic medicine. This drug is banned from use in athletes  by most athletic organizations. What side effects may I notice from receiving this medicine? Side effects that you should report to your doctor or health care professional as soon as possible: -allergic reactions like skin rash, itching or hives, swelling of the face, lips, or tongue -breast enlargement -breathing problems -changes in mood, especially anger, depression, or rage -dark  urine -general ill feeling or flu-like symptoms -light-colored stools -loss of appetite, nausea -nausea, vomiting -right upper belly pain -stomach pain -swelling of ankles -too frequent or persistent erections -trouble passing urine or change in the amount of urine -unusually weak or tired -yellowing of the eyes or skin Additional side effects that can occur in women include: -deep or hoarse voice -facial hair growth -irregular menstrual periods Side effects that usually do not require medical attention (report to your doctor or health care professional if they continue or are bothersome): -acne -change in sex drive or performance -hair loss -headache This list may not describe all possible side effects. Call your doctor for medical advice about side effects. You may report side effects to FDA at 1-800-FDA-1088. Where should I keep my medicine? Keep out of the reach of children. This medicine can be abused. Keep your medicine in a safe place to protect it from theft. Do not share this medicine with anyone. Selling or giving away this medicine is dangerous and against the law. Store at room temperature between 20 and 25 degrees C (68 and 77 degrees F). Do not freeze. Protect from light. Follow the directions for the product you are prescribed. Throw away any unused medicine after the expiration date. NOTE: This sheet is a summary. It may not cover all possible information. If you have questions about this medicine, talk to your doctor, pharmacist, or health care provider.  2015, Elsevier/Gold Standard. (2013-04-27 [id number])

## 2014-09-05 NOTE — Progress Notes (Signed)
Hematology and Oncology Follow Up Visit  [name] [id number] 1950/07/06 64 y.o. 09/05/2014   Principle Diagnosis:  1. IgG kappa myeloma. 2. Anemia secondary to renal insufficiency. 3. Intermittent iron-deficiency anemia. 4. Hypotestosteronemia  Current Therapy:   1. Aredia 60 mg IV q.2 months  2. Aranesp 300 mcg subcu as needed for hemoglobin less than 11. 3. Depo-Testosterone 300 mg q.2 weeks. 4. IV iron as indicated.    Interim History:  [name] is here today for a follow-up. He is feeling fatigued today. He c/o SOB with exertion especially in the heat.  He is having issues with his fibromyalgia and pain in his joints. He is followed by rheumatology.  He has had no problem with infections. No fever, chills, n/v, cough, rash, dizziness, SOB, chest pain, palpitations, diarrhea, blood in urine or stool. He has occasional abdominal pain with constipation that is relieved with stool softeners. His last colonoscopy was in 2013 and was negative for malignancy.  No swelling in his extremities. He does have some mild neuropathy in his hands and feet. This is not a new issue and has not gotten any worse.  He is diabetic and states that his blood sugars have been "ok but hard to keep regular." He is taking Glipizide daily. He has a good appetite and is staying well hydrated. His weight is stable. In May his monoclonal spike was 0.7 g/dl, IgG level was 1890 mg/dl and kappa light chain was 5.23 mg/dl. His testosterone at that time was low at 199 ng/dl.  Medications:    Medication List       This list is accurate as of: 09/05/14  8:46 AM.  Always use your most recent med list.               amLODipine 10 MG tablet  Commonly known as:  NORVASC  Take 10 mg by mouth daily.     CARAFATE 1 GM/10ML suspension  Generic drug:  sucralfate  Take 1 g by mouth 4 (four) times daily.     CLEAR EYES ALL SEASONS 5-6 MG/ML Soln  Generic drug:  Polyvinyl Alcohol-Povidone  Apply 2 drops to  eye 3 (three) times daily as needed (drye eyes.).     diazepam 5 MG tablet  Commonly known as:  VALIUM  Take 5 mg by mouth every 4 (four) hours as needed for anxiety.     glipiZIDE 5 MG tablet  Commonly known as:  GLUCOTROL  Take 5 mg by mouth daily before breakfast.     LASIX 40 MG tablet  Generic drug:  furosemide  Take 40 mg by mouth.     meclizine 50 MG tablet  Commonly known as:  ANTIVERT  Take 0.5 tablets (25 mg total) by mouth 4 (four) times daily as needed for dizziness.     meloxicam 15 MG tablet  Commonly known as:  MOBIC  Take 1 tablet (15 mg total) by mouth daily.     methocarbamol 500 MG tablet  Commonly known as:  ROBAXIN  Take 500 mg by mouth 2 (two) times daily as needed for muscle spasms.     NATURAL BALANCE TEARS OP  Apply 1 drop to eye every morning.     omeprazole 20 MG capsule  Commonly known as:  PRILOSEC  Take 1 capsule (20 mg total) by mouth daily.     oxyCODONE-acetaminophen 10-325 MG per tablet  Commonly known as:  PERCOCET  Take 1 tablet by mouth every 6 (six) hours as needed.  Pain     OXYCONTIN 80 mg T12a 12 hr tablet  Generic drug:  OxyCODONE  Take 80 mg by mouth 3 (three) times daily.     polyethylene glycol packet  Commonly known as:  MIRALAX / GLYCOLAX  Take 17 g by mouth daily.     potassium chloride SA 20 MEQ tablet  Commonly known as:  K-DUR,KLOR-CON  Take 20 mEq by mouth 2 (two) times daily as needed (takes with furosemide).     prochlorperazine 10 MG tablet  Commonly known as:  COMPAZINE  Take 1 tablet (10 mg total) by mouth every 6 (six) hours as needed for nausea or vomiting.     tamsulosin 0.4 MG Caps capsule  Commonly known as:  FLOMAX  Take 0.4 mg by mouth daily.     triamterene-hydrochlorothiazide 37.5-25 MG per tablet  Commonly known as:  MAXZIDE-25  Take 1 tablet by mouth daily.     Vitamin D (Ergocalciferol) 50000 UNITS Caps capsule  Commonly known as:  DRISDOL  Take 50,000 Units by mouth every 7 (seven)  days.        Allergies:  Allergies  Allergen Reactions  . Iodine Rash  . Iodine Anxiety    Didn't feel right "instructed not to take per MD--something with his port"    Past Medical History, Surgical history, Social history, and Family History were reviewed and updated.  Review of Systems: All other 10 point review of systems is negative.   Physical Exam:  weight is 191 lb 8 oz (86.864 kg). His oral temperature is 98.1 F (36.7 C). His blood pressure is 144/72 and his pulse is 81. His respiration is 20.   Wt Readings from Last 3 Encounters:  09/05/14 191 lb 8 oz (86.864 kg)  07/11/14 194 lb (87.998 kg)  05/16/14 199 lb (90.266 kg)    Ocular: Sclerae unicteric, pupils equal, round and reactive to light Ear-nose-throat: Oropharynx clear, dentition fair Lymphatic: No cervical or supraclavicular adenopathy Lungs no rales or rhonchi, good excursion bilaterally Heart regular rate and rhythm, no murmur appreciated Abd soft, nontender, positive bowel sounds MSK no focal spinal tenderness, no joint edema Neuro: non-focal, well-oriented, appropriate affect Breasts: Deferred  Lab Results  Component Value Date   WBC 8.7 09/05/2014   HGB 11.7* 09/05/2014   HCT 34.2* 09/05/2014   MCV 76* 09/05/2014   PLT 221 09/05/2014   Lab Results  Component Value Date   FERRITIN 1,975* 07/11/2014   IRON 84 07/11/2014   TIBC 216 07/11/2014   UIBC 133 07/11/2014   IRONPCTSAT 39 07/11/2014   Lab Results  Component Value Date   RETICCTPCT 0.6 03/21/2014   RBC 4.50 09/05/2014   RETICCTABS 27.5 03/21/2014   Lab Results  Component Value Date   KPAFRELGTCHN 5.23* 07/11/2014   LAMBDASER 3.23* 07/11/2014   KAPLAMBRATIO 1.62 07/11/2014   Lab Results  Component Value Date   IGGSERUM 2100* 07/11/2014   IGA 388* 07/11/2014   IGMSERUM 77 07/11/2014   Lab Results  Component Value Date   TOTALPROTELP 8.5* 07/11/2014   ALBUMINELP 4.2 07/11/2014   A1GS 0.3 07/11/2014   A2GS 1.1*  07/11/2014   BETS 0.4 07/11/2014   BETA2SER 0.5 07/11/2014   GAMS 2.0* 07/11/2014   MSPIKE 0.93 04/18/2014   SPEI * 07/11/2014     Chemistry      Component Value Date/Time   NA 140 07/11/2014 0855   NA 139 05/13/2014 1057   NA 137 08/23/2012 2105   K 3.5 07/11/2014 0855  K 3.6 05/13/2014 1057   K 4.0 08/23/2012 2105   CL 96* 07/11/2014 0855   CL 101 05/13/2014 1057   CL 101 08/23/2012 2105   CO2 31 07/11/2014 0855   CO2 29 03/21/2014 0826   CO2 32 08/23/2012 2105   BUN 40* 07/11/2014 0855   BUN 42* 05/13/2014 1057   BUN 23* 08/23/2012 2105   CREATININE 2.6* 07/11/2014 0855   CREATININE 2.40* 05/13/2014 1057   CREATININE 2.53* 08/23/2012 2105      Component Value Date/Time   CALCIUM 9.7 07/11/2014 0855   CALCIUM 9.0 03/21/2014 0826   CALCIUM 8.7 08/23/2012 2105   ALKPHOS 63 07/11/2014 0855   ALKPHOS 69 03/21/2014 0826   ALKPHOS 101 08/23/2012 2105   AST 25 07/11/2014 0855   AST 22 03/21/2014 0826   AST 27 08/23/2012 2105   ALT 23 07/11/2014 0855   ALT 19 03/21/2014 0826   ALT 21 08/23/2012 2105   BILITOT 0.50 07/11/2014 0855   BILITOT 0.4 03/21/2014 0826      Impression and Plan: [name] is a 64 year old gentleman with IgG kappa myeloma. So far this has not been an issue for him. We also see him for multifactorial anemia (iron deficiency/renal insufficiency) and hypotestosteronemia. He is feeling fatigued and SOB with exertion. His CBC today looks ok. We will see what his iron studies show. He may need a dose of Feraheme later this week. No Aranesp.  We will continue to give him his Aredia injection every 2 months.  He will continue to get his depo-testosterone every 2 weeks.   We will plan to see him back in 2 months for labs and follow-up.  He knows to call with any questions or concerns. We can certainly see him sooner if need be.   Eliezer Bottom, NP 7/13/20168:46 AM

## 2014-09-07 LAB — SPEP & IFE WITH QIG
ALBUMIN ELP: 4 g/dL (ref 3.8–4.8)
Abnormal Protein Band1: 0.9 g/dL
Alpha-1-Globulin: 0.3 g/dL (ref 0.2–0.3)
Alpha-2-Globulin: 1 g/dL — ABNORMAL HIGH (ref 0.5–0.9)
Beta 2: 0.5 g/dL (ref 0.2–0.5)
Beta Globulin: 0.4 g/dL (ref 0.4–0.6)
GAMMA GLOBULIN: 2 g/dL — AB (ref 0.8–1.7)
IgA: 338 mg/dL (ref 68–379)
IgG (Immunoglobin G), Serum: 2160 mg/dL — ABNORMAL HIGH (ref 650–1600)
IgM, Serum: 70 mg/dL (ref 41–251)
Total Protein, Serum Electrophoresis: 8.2 g/dL — ABNORMAL HIGH (ref 6.1–8.1)

## 2014-09-07 LAB — KAPPA/LAMBDA LIGHT CHAINS
KAPPA FREE LGHT CHN: 6.01 mg/dL — AB (ref 0.33–1.94)
KAPPA LAMBDA RATIO: 1.9 — AB (ref 0.26–1.65)
LAMBDA FREE LGHT CHN: 3.16 mg/dL — AB (ref 0.57–2.63)

## 2014-09-07 LAB — TESTOSTERONE: Testosterone: 167 ng/dL — ABNORMAL LOW (ref 300–890)

## 2014-09-20 ENCOUNTER — Emergency Department (HOSPITAL_COMMUNITY): Payer: Medicare Other

## 2014-09-20 ENCOUNTER — Emergency Department (HOSPITAL_COMMUNITY)
Admission: EM | Admit: 2014-09-20 | Discharge: 2014-09-20 | Disposition: A | Discharge status: Home / Self Care | Payer: Medicare Other | Attending: Emergency Medicine | Admitting: Emergency Medicine

## 2014-09-20 ENCOUNTER — Encounter (HOSPITAL_COMMUNITY): Payer: Self-pay

## 2014-09-20 DIAGNOSIS — Z8579 Personal history of other malignant neoplasms of lymphoid, hematopoietic and related tissues: Secondary | ICD-10-CM | POA: Insufficient documentation

## 2014-09-20 DIAGNOSIS — Z8719 Personal history of other diseases of the digestive system: Secondary | ICD-10-CM | POA: Insufficient documentation

## 2014-09-20 DIAGNOSIS — R1084 Generalized abdominal pain: Secondary | ICD-10-CM | POA: Insufficient documentation

## 2014-09-20 DIAGNOSIS — E785 Hyperlipidemia, unspecified: Secondary | ICD-10-CM | POA: Diagnosis not present

## 2014-09-20 DIAGNOSIS — M1990 Unspecified osteoarthritis, unspecified site: Secondary | ICD-10-CM | POA: Insufficient documentation

## 2014-09-20 DIAGNOSIS — Z862 Personal history of diseases of the blood and blood-forming organs and certain disorders involving the immune mechanism: Secondary | ICD-10-CM | POA: Insufficient documentation

## 2014-09-20 DIAGNOSIS — Z8619 Personal history of other infectious and parasitic diseases: Secondary | ICD-10-CM | POA: Insufficient documentation

## 2014-09-20 DIAGNOSIS — I10 Essential (primary) hypertension: Secondary | ICD-10-CM | POA: Diagnosis not present

## 2014-09-20 DIAGNOSIS — Z79899 Other long term (current) drug therapy: Secondary | ICD-10-CM | POA: Diagnosis not present

## 2014-09-20 DIAGNOSIS — E1140 Type 2 diabetes mellitus with diabetic neuropathy, unspecified: Secondary | ICD-10-CM | POA: Diagnosis not present

## 2014-09-20 DIAGNOSIS — F419 Anxiety disorder, unspecified: Secondary | ICD-10-CM | POA: Insufficient documentation

## 2014-09-20 LAB — COMPREHENSIVE METABOLIC PANEL
ALBUMIN: 3.8 g/dL (ref 3.5–5.0)
ALK PHOS: 71 U/L (ref 38–126)
ALT: 19 U/L (ref 17–63)
ANION GAP: 7 (ref 5–15)
AST: 22 U/L (ref 15–41)
BUN: 34 mg/dL — ABNORMAL HIGH (ref 6–20)
CHLORIDE: 99 mmol/L — AB (ref 101–111)
CO2: 30 mmol/L (ref 22–32)
CREATININE: 2.29 mg/dL — AB (ref 0.61–1.24)
Calcium: 9.5 mg/dL (ref 8.9–10.3)
GFR calc non Af Amer: 28 mL/min — ABNORMAL LOW (ref >60)
GFR, EST AFRICAN AMERICAN: 33 mL/min — AB (ref >60)
GLUCOSE: 150 mg/dL — AB (ref 65–99)
POTASSIUM: 3.3 mmol/L — AB (ref 3.5–5.1)
Sodium: 136 mmol/L (ref 135–145)
Total Bilirubin: 0.5 mg/dL (ref 0.3–1.2)
Total Protein: 8.4 g/dL — ABNORMAL HIGH (ref 6.5–8.1)

## 2014-09-20 LAB — I-STAT CG4 LACTIC ACID, ED
Lactic Acid, Venous: 0.93 mmol/L (ref 0.5–2.0)
Lactic Acid, Venous: 1.31 mmol/L (ref 0.5–2.0)

## 2014-09-20 LAB — URINALYSIS, ROUTINE W REFLEX MICROSCOPIC
BILIRUBIN URINE: NEGATIVE
Glucose, UA: NEGATIVE mg/dL
Hgb urine dipstick: NEGATIVE
Ketones, ur: NEGATIVE mg/dL
Leukocytes, UA: NEGATIVE
Nitrite: NEGATIVE
Protein, ur: NEGATIVE mg/dL
Specific Gravity, Urine: 1.01 (ref 1.005–1.030)
UROBILINOGEN UA: 0.2 mg/dL (ref 0.0–1.0)
pH: 7 (ref 5.0–8.0)

## 2014-09-20 LAB — CBC
HEMATOCRIT: 37.3 % — AB (ref 39.0–52.0)
Hemoglobin: 12.1 g/dL — ABNORMAL LOW (ref 13.0–17.0)
MCH: 25 pg — AB (ref 26.0–34.0)
MCHC: 32.4 g/dL (ref 30.0–36.0)
MCV: 77.1 fL — ABNORMAL LOW (ref 78.0–100.0)
Platelets: 259 10*3/uL (ref 150–400)
RBC: 4.84 MIL/uL (ref 4.22–5.81)
RDW: 16 % — AB (ref 11.5–15.5)
WBC: 9.2 10*3/uL (ref 4.0–10.5)

## 2014-09-20 LAB — POC OCCULT BLOOD, ED: Fecal Occult Bld: NEGATIVE

## 2014-09-20 LAB — LIPASE, BLOOD: LIPASE: 30 U/L (ref 22–51)

## 2014-09-20 MED ORDER — ONDANSETRON 4 MG PO TBDP: 4.0000 mg | ORAL_TABLET | Freq: Three times a day (TID) | ORAL | Status: DC | PRN

## 2014-09-20 MED ORDER — POTASSIUM CHLORIDE CRYS ER 20 MEQ PO TBCR
40.0000 meq | EXTENDED_RELEASE_TABLET | Freq: Once | ORAL | Status: AC
  Administered 2014-09-20: 40 meq via ORAL
  Filled 2014-09-20: qty 2

## 2014-09-20 MED ORDER — ONDANSETRON HCL 4 MG/2ML IJ SOLN
4.0000 mg | Freq: Once | INTRAMUSCULAR | Status: AC
  Administered 2014-09-20: 4 mg via INTRAVENOUS
  Filled 2014-09-20: qty 2

## 2014-09-20 MED ORDER — HYDROMORPHONE HCL 1 MG/ML IJ SOLN
1.0000 mg | Freq: Once | INTRAMUSCULAR | Status: AC
  Administered 2014-09-20: 1 mg via INTRAVENOUS
  Filled 2014-09-20: qty 1

## 2014-09-20 MED ORDER — MORPHINE SULFATE 4 MG/ML IJ SOLN
4.0000 mg | Freq: Once | INTRAMUSCULAR | Status: AC
  Administered 2014-09-20: 4 mg via INTRAVENOUS
  Filled 2014-09-20: qty 1

## 2014-09-20 NOTE — ED Notes (Signed)
Per PA, hold discharge until patient's pain decreases and successful PO intake.  Will attempt PO intake when his pain decreases.

## 2014-09-20 NOTE — ED Notes (Signed)
Nurse drawing labs. 

## 2014-09-20 NOTE — ED Notes (Signed)
Attempted to access port unsuccessful. CT WO contrast ordered at present time. Will attempt reaccessing port if orders require intervention via venous access.

## 2014-09-20 NOTE — ED Provider Notes (Signed)
  Physical Exam  BP 129/68 mmHg  Pulse 82  Temp(Src) 99.5 F (37.5 C) (Rectal)  Resp 18  SpO2 98%  Physical Exam  Constitutional: He appears well-developed and well-nourished. No distress.  Chronically ill appearing  HENT:  Head: Atraumatic.  Eyes: Conjunctivae are normal.  Neck: Neck supple.  Abdominal: Soft. There is tenderness (diffused tenderness).  Neurological: He is alert.  Skin: No rash noted.  Psychiatric: He has a normal mood and affect.  Nursing note and vitals reviewed.     ED Course  Procedures  MDM Report received at beginning of shift.  Patient with history of multiple myeloma. He is here for abdominal pain. He had a normal abdominal CT scan. Evidence of mild hypokalemia, will provide supplementation. His pain at this time is not well controlled and he still nauseous, will continue with pain management and nausea control.  5:51 PM Pt felt better, stable for discharge.     Adrian Moras, PA-C 09/20/14 1751  Ernestina Patches, MD 09/21/14 [phone]

## 2014-09-20 NOTE — ED Notes (Signed)
Per pt, n/v x 2 months with abdominal pain.  DX with abdominal hernia for a while.  Fever at MD office today.  ? Weight loss recently.  Decreased appetite. Some diarrhea at times.

## 2014-09-20 NOTE — Discharge Instructions (Signed)
Take zofran as needed for nausea. Refer to attached documents for more information. Follow up with your doctor for further evaluation.

## 2014-09-20 NOTE — ED Notes (Signed)
Pt transported to CT; will attempt peripheral IV for cultures and medication administration with pt return.

## 2014-09-20 NOTE — ED Provider Notes (Addendum)
Complains of diffuse abdominal pain for the past 2 months. Sensory unchanged. He's been treated with oxycodone and OxyContin continued pain. Was seen at his PMDs office earlier today, had temperature 100.1. He also vomited 3 times, clear liquid. He denies nausea at present. Other associated symptoms last bowel movement 4 days ago brown stool mixed with red blood. On exam alert nontoxic chronically ill-appearing abdomen obese, mild diffuse tenderness. Genitalia normal male. Rectal normal tone brown stool. No gross blood  Orlie Dakin, MD 09/20/14 [place], MD 09/20/14 [phone]

## 2014-09-20 NOTE — ED Provider Notes (Signed)
CSN: [id number]     Arrival date & time 09/20/14  1322 History   First MD Initiated Contact with Patient 09/20/14 1341     Chief Complaint  Patient presents with  . Emesis  . Abdominal Pain     (Consider location/radiation/quality/duration/timing/severity/associated sxs/prior Treatment) HPI  Past Medical History  Diagnosis Date  . Multiple myeloma   . Diabetes mellitus without complication   . Multiple myeloma   . Hypertension   . Renal insufficiency   . Diverticulosis   . Hemorrhoids   . Type 2 diabetes mellitus   . Anxiety disorder   . Urinary retention   . Anemia   . Diabetic neuropathy   . Constipation   . Herpes zoster   . Arthritis   . Hyperlipidemia   . Myeloma 12/31/2010  . Arthralgia of multiple joints 12/31/2010  . Anemia, iron deficiency 03/12/2011  . 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 Hypotestosteronism 03/17/2012   Past Surgical History  Procedure Laterality Date  . Knee surgery    . Knee arthroscopy      left  . Portacath placement    . Cataract extraction      bi-lateral  . Transurethral resection of prostate  01/2011    Dr. at [facility] in [place]  . Colonoscopy    . Esophagogastroduodenoscopy    . Colonoscopy  07/13/2011    Procedure: COLONOSCOPY;  Surgeon: Ladene Artist, MD,FACG;  Location: [facility];  Service: Endoscopy;  Laterality: N/A;   Family History  Problem Relation Age of Onset  . Breast cancer Sister   . Breast cancer Mother   . Diabetes Sister   . Bone cancer Father   . Colon cancer Neg Hx   . Heart disease Sister   . Heart disease Mother   . Heart disease Daughter   . Throat cancer Brother   . Stomach cancer Brother    History  Substance Use Topics  . Smoking status: Never Smoker   . Smokeless tobacco: Never Used     Comment: never used tobacco  . Alcohol Use: No    Review of Systems    Allergies  Iodine and Iodine  Home Medications   Prior to Admission medications   Medication Sig Start Date  End Date Taking? Authorizing Provider  amLODipine (NORVASC) 10 MG tablet Take 10 mg by mouth daily.    Historical Provider, MD  CARAFATE 1 GM/10ML suspension Take 1 g by mouth 4 (four) times daily.  07/28/11   Historical Provider, MD  diazepam (VALIUM) 5 MG tablet Take 5 mg by mouth every 4 (four) hours as needed for anxiety.    Historical Provider, MD  furosemide (LASIX) 40 MG tablet Take 40 mg by mouth.    Historical Provider, MD  glipiZIDE (GLUCOTROL) 5 MG tablet Take 5 mg by mouth daily before breakfast.    Historical Provider, MD  Hypromellose (NATURAL BALANCE TEARS OP) Apply 1 drop to eye every morning.     Historical Provider, MD  meclizine (ANTIVERT) 50 MG tablet Take 0.5 tablets (25 mg total) by mouth 4 (four) times daily as needed for dizziness. 12/13/11   Virgel Manifold, MD  meloxicam (MOBIC) 15 MG tablet Take 1 tablet (15 mg total) by mouth daily. 02/09/14   Volanda Napoleon, MD  methocarbamol (ROBAXIN) 500 MG tablet Take 500 mg by mouth 2 (two) times daily as needed for muscle spasms.     Historical Provider, MD  omeprazole (PRILOSEC) 20 MG capsule Take  1 capsule (20 mg total) by mouth daily. 08/30/13   Ladene Artist, MD  OxyCODONE (OXYCONTIN) 80 mg T12A 12 hr tablet Take 80 mg by mouth 3 (three) times daily.    Historical Provider, MD  oxyCODONE-acetaminophen (PERCOCET) 10-325 MG per tablet Take 1 tablet by mouth every 6 (six) hours as needed. Pain    Historical Provider, MD  polyethylene glycol (MIRALAX / GLYCOLAX) packet Take 17 g by mouth daily.    Historical Provider, MD  Polyvinyl Alcohol-Povidone (CLEAR EYES ALL SEASONS) 5-6 MG/ML SOLN Apply 2 drops to eye 3 (three) times daily as needed (drye eyes.).     Historical Provider, MD  potassium chloride SA (K-DUR,KLOR-CON) 20 MEQ tablet Take 20 mEq by mouth 2 (two) times daily as needed (takes with furosemide).     Historical Provider, MD  prochlorperazine (COMPAZINE) 10 MG tablet Take 1 tablet (10 mg total) by mouth every 6 (six) hours as  needed for nausea or vomiting. 04/18/14   Volanda Napoleon, MD  Tamsulosin HCl (FLOMAX) 0.4 MG CAPS Take 0.4 mg by mouth daily.     Historical Provider, MD  triamterene-hydrochlorothiazide (MAXZIDE-25) 37.5-25 MG per tablet Take 1 tablet by mouth daily.      Historical Provider, MD  Vitamin D, Ergocalciferol, (DRISDOL) 50000 UNITS CAPS capsule Take 50,000 Units by mouth every 7 (seven) days.    Historical Provider, MD   BP 148/72 mmHg  Pulse 91  Temp(Src) 98.2 F (36.8 C) (Oral)  SpO2 99% Physical Exam  ED Course  Procedures (including critical care time) Labs Review Labs Reviewed  LIPASE, BLOOD  COMPREHENSIVE METABOLIC PANEL  CBC  URINALYSIS, ROUTINE W REFLEX MICROSCOPIC (NOT AT [facility])    Imaging Review No results found.   EKG Interpretation None      MDM   Final diagnoses:  None    Please delete. Duplicate note. This note entered in error    Orlie Dakin, MD 09/20/14 1658

## 2014-09-20 NOTE — ED Provider Notes (Signed)
CSN: [id number]     Arrival date & time 09/20/14  1322 History   First MD Initiated Contact with Patient 09/20/14 1341     Chief Complaint  Patient presents with  . Emesis  . Abdominal Pain     (Consider location/radiation/quality/duration/timing/severity/associated sxs/prior Treatment) HPI Comments: Patient is a 64 year old male with a past medical history of myeloma, anemia, diabetes, and diverticulosis who presents from his PCP office with abdominal pain and fever. Patient reports having abdominal pain for the past 2 months that worsened in the past week. The pain is aching and severe and located over his entire abdomen. Patient reports associated nausea, clear liquid vomiting, and blood streaked bowel movements. Patient's last infusion at the [facility] was 2 weeks ago. Patient's fever at PCP office was 100.1. No aggravating/alleviating factors. No other associated symptoms.    Past Medical History  Diagnosis Date  . Multiple myeloma   . Diabetes mellitus without complication   . Multiple myeloma   . Hypertension   . Renal insufficiency   . Diverticulosis   . Hemorrhoids   . Type 2 diabetes mellitus   . Anxiety disorder   . Urinary retention   . Anemia   . Diabetic neuropathy   . Constipation   . Herpes zoster   . Arthritis   . Hyperlipidemia   . Myeloma 12/31/2010  . Arthralgia of multiple joints 12/31/2010  . Anemia, iron deficiency 03/12/2011  . 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 Hypotestosteronism 03/17/2012   Past Surgical History  Procedure Laterality Date  . Knee surgery    . Knee arthroscopy      left  . Portacath placement    . Cataract extraction      bi-lateral  . Transurethral resection of prostate  01/2011    Dr. at [facility] in [place]  . Colonoscopy    . Esophagogastroduodenoscopy    . Colonoscopy  07/13/2011    Procedure: COLONOSCOPY;  Surgeon: Ladene Artist, MD,FACG;  Location: [facility] ENDOSCOPY;  Service: Endoscopy;  Laterality: N/A;    Family History  Problem Relation Age of Onset  . Breast cancer Sister   . Breast cancer Mother   . Diabetes Sister   . Bone cancer Father   . Colon cancer Neg Hx   . Heart disease Sister   . Heart disease Mother   . Heart disease Daughter   . Throat cancer Brother   . Stomach cancer Brother    History  Substance Use Topics  . Smoking status: Never Smoker   . Smokeless tobacco: Never Used     Comment: never used tobacco  . Alcohol Use: No    Review of Systems  Constitutional: Positive for fever.  Gastrointestinal: Positive for abdominal pain and blood in stool.  All other systems reviewed and are negative.     Allergies  Iodine and Iodine  Home Medications   Prior to Admission medications   Medication Sig Start Date End Date Taking? Authorizing Provider  amLODipine (NORVASC) 10 MG tablet Take 10 mg by mouth daily.   Yes Historical Provider, MD  CARAFATE 1 GM/10ML suspension Take 1 g by mouth 4 (four) times daily.  07/28/11  Yes Historical Provider, MD  diazepam (VALIUM) 5 MG tablet Take 5 mg by mouth every 4 (four) hours as needed for anxiety.   Yes Historical Provider, MD  furosemide (LASIX) 40 MG tablet Take 40 mg by mouth.   Yes Historical Provider, MD  glipiZIDE (GLUCOTROL)  5 MG tablet Take 5 mg by mouth daily before breakfast.   Yes Historical Provider, MD  Hypromellose (NATURAL BALANCE TEARS OP) Apply 1 drop to eye every morning.    Yes Historical Provider, MD  methocarbamol (ROBAXIN) 500 MG tablet Take 500 mg by mouth 2 (two) times daily as needed for muscle spasms.    Yes Historical Provider, MD  omeprazole (PRILOSEC) 20 MG capsule Take 1 capsule (20 mg total) by mouth daily. 08/30/13  Yes Ladene Artist, MD  OxyCODONE (OXYCONTIN) 80 mg T12A 12 hr tablet Take 80 mg by mouth 3 (three) times daily.   Yes Historical Provider, MD  oxyCODONE-acetaminophen (PERCOCET) 10-325 MG per tablet Take 1 tablet by mouth every 6 (six) hours as needed for pain.    Yes Historical  Provider, MD  polyethylene glycol (MIRALAX / GLYCOLAX) packet Take 17 g by mouth daily.   Yes Historical Provider, MD  Polyvinyl Alcohol-Povidone (CLEAR EYES ALL SEASONS) 5-6 MG/ML SOLN Apply 2 drops to eye 3 (three) times daily as needed (drye eyes.).    Yes Historical Provider, MD  potassium chloride SA (K-DUR,KLOR-CON) 20 MEQ tablet Take 20 mEq by mouth 2 (two) times daily as needed (takes with furosemide).    Yes Historical Provider, MD  prochlorperazine (COMPAZINE) 10 MG tablet Take 1 tablet (10 mg total) by mouth every 6 (six) hours as needed for nausea or vomiting. 04/18/14  Yes Volanda Napoleon, MD  Tamsulosin HCl (FLOMAX) 0.4 MG CAPS Take 0.4 mg by mouth daily.    Yes Historical Provider, MD  triamterene-hydrochlorothiazide (MAXZIDE-25) 37.5-25 MG per tablet Take 1 tablet by mouth daily.     Yes Historical Provider, MD  Vitamin D, Ergocalciferol, (DRISDOL) 50000 UNITS CAPS capsule Take 50,000 Units by mouth every 7 (seven) days.   Yes Historical Provider, MD  meclizine (ANTIVERT) 50 MG tablet Take 0.5 tablets (25 mg total) by mouth 4 (four) times daily as needed for dizziness. Patient not taking: Reported on 09/20/2014 12/13/11   Virgel Manifold, MD  meloxicam (MOBIC) 15 MG tablet Take 1 tablet (15 mg total) by mouth daily. Patient not taking: Reported on 09/20/2014 02/09/14   Volanda Napoleon, MD   BP 148/72 mmHg  Pulse 91  Temp(Src) 99.5 F (37.5 C) (Rectal)  SpO2 99% Physical Exam  Constitutional: He appears well-developed and well-nourished. No distress.  HENT:  Head: Normocephalic and atraumatic.  Eyes: Conjunctivae and EOM are normal.  Neck: Normal range of motion.  Cardiovascular: Normal rate and regular rhythm.  Exam reveals no gallop and no friction rub.   No murmur heard. Pulmonary/Chest: Effort normal and breath sounds normal. He has no wheezes. He has no rales. He exhibits no tenderness.  Abdominal: Soft. He exhibits no distension. There is tenderness. There is no rebound.   Musculoskeletal: Normal range of motion.  Neurological: He is alert.  Speech is goal-oriented. Moves limbs without ataxia.   Skin: Skin is warm and dry.  Psychiatric: He has a normal mood and affect. His behavior is normal.  Nursing note and vitals reviewed.   ED Course  Procedures (including critical care time) Labs Review Labs Reviewed  COMPREHENSIVE METABOLIC PANEL - Abnormal; Notable for the following:    Potassium 3.3 (*)    Chloride 99 (*)    Glucose, Bld 150 (*)    BUN 34 (*)    Creatinine, Ser 2.29 (*)    Total Protein 8.4 (*)    GFR calc non Af Amer 28 (*)  GFR calc Af Amer 33 (*)    All other components within normal limits  CBC - Abnormal; Notable for the following:    Hemoglobin 12.1 (*)    HCT 37.3 (*)    MCV 77.1 (*)    MCH 25.0 (*)    RDW 16.0 (*)    All other components within normal limits  CULTURE, BLOOD (ROUTINE X 2)  CULTURE, BLOOD (ROUTINE X 2)  LIPASE, BLOOD  URINALYSIS, ROUTINE W REFLEX MICROSCOPIC (NOT AT [facility])  I-STAT CG4 LACTIC ACID, ED  POC OCCULT BLOOD, ED    Imaging Review Ct Abdomen Pelvis Wo Contrast  09/20/2014   CLINICAL DATA:  Nausea and vomiting for 2 months.  Abdominal pain  EXAM: CT ABDOMEN AND PELVIS WITHOUT CONTRAST  TECHNIQUE: Multidetector CT imaging of the abdomen and pelvis was performed following the standard protocol without IV contrast.  COMPARISON:  05/25/2014  FINDINGS: Lower chest: The lung bases stress set there is no pleural effusion identified. Pulmonary nodule in the right lower lobe measures 5 mm, image 1/series 4. This is unchanged from previous exam. Also stable is a 4 mm right lower lobe nodule, image 7/series 4.  Hepatobiliary: No suspicious liver abnormalities. The gallbladder appears normal. No biliary dilatation.  Pancreas: Normal appearance of the pancreas.  Spleen: The spleen is unremarkable.  Adrenals/Urinary Tract: The adrenal glands are both normal. Cyst arising from the inferior pole of the right kidney  measures 2.3 cm and is incompletely characterized without IV contrast material. Also incompletely characterized is a 1.5 cm cyst within the interpolar region of the left kidney. No nephrolithiasis or hydronephrosis. The urinary bladder is normal.  Stomach/Bowel: The stomach is normal. The small bowel loops are on unremarkable.  Vascular/Lymphatic: Normal appearance of the abdominal aorta. No enlarged retroperitoneal or mesenteric adenopathy. No enlarged pelvic or inguinal lymph nodes.  Reproductive: The prostate gland and seminal vesicles appear within normal limits.  Other: There is no ascites or focal fluid collections within the abdomen or pelvis.  Musculoskeletal: Mild degenerative disc disease noted within the lumbar spine.  IMPRESSION: 1. No acute findings within the abdomen or pelvis. No explanation for patient's abdominal pain, nausea and vomiting. 2. No hernia visualize. 3. Small right lower lobe lung nodules are unchanged from 2014 and are compatible with a benign process.   Electronically Signed   By: Kerby Moors M.D.   On: 09/20/2014 16:10     EKG Interpretation None      MDM   Final diagnoses:  Generalized abdominal pain    3:28 PM Patient's labs unremarkable for acute changes. Urinalysis pending. Occult stool negative.   3:57 PM Patient's labs and urinalysis unremarkable for acute changes. CT abdomen pelvis pending. Vitals stable and patient afebrile. If CT unremarkable, patient will be discharged with PCP follow up.   CT shows no acute changes. Patient feeling better after pain and nausea treated here. Patient comfortable with outpatient follow up with his PCP. Patient instructed to return with worsening or concerning symptoms. Patient to be discharged with Phenergan.   Alvina Chou, PA-C 09/21/14 2014  Orlie Dakin, MD 09/22/14 1152

## 2014-09-20 NOTE — ED Notes (Signed)
Pt in CT.

## 2014-09-25 LAB — CULTURE, BLOOD (ROUTINE X 2)
CULTURE: NO GROWTH
Culture: NO GROWTH

## 2014-09-26 ENCOUNTER — Ambulatory Visit (INDEPENDENT_AMBULATORY_CARE_PROVIDER_SITE_OTHER): Payer: Medicare Other | Admitting: Nurse Practitioner

## 2014-09-26 ENCOUNTER — Encounter: Payer: Self-pay | Admitting: Nurse Practitioner

## 2014-09-26 VITALS — BP 130/70 | HR 78 | Ht 71.0 in | Wt 198.0 lb

## 2014-09-26 DIAGNOSIS — K625 Hemorrhage of anus and rectum: Secondary | ICD-10-CM | POA: Diagnosis not present

## 2014-09-26 DIAGNOSIS — R1310 Dysphagia, unspecified: Secondary | ICD-10-CM | POA: Diagnosis not present

## 2014-09-26 DIAGNOSIS — R112 Nausea with vomiting, unspecified: Secondary | ICD-10-CM

## 2014-09-26 DIAGNOSIS — K5900 Constipation, unspecified: Secondary | ICD-10-CM | POA: Insufficient documentation

## 2014-09-26 MED ORDER — HYDROCORTISONE 2.5 % RE CREA: TOPICAL_CREAM | RECTAL | Status: DC

## 2014-09-26 NOTE — Patient Instructions (Addendum)
You have been scheduled for an endoscopy. Please follow written instructions given to you at your visit today. If you use inhalers (even only as needed), please bring them with you on the day of your procedure. Your physician has requested that you go to www.startemmi.com and enter the access code given to you at your visit today. This web site gives a general overview about your procedure. However, you should still follow specific instructions given to you by our office regarding your preparation for the procedure.  We sent a prescription to [facility] for Anusol HC Cream for the rectal area. Take Miralax twice daily.  Call after using the Anusol Cream of still bleeding. Use Miralax twice daily, 17 grams in 8 0z of water.

## 2014-09-26 NOTE — Progress Notes (Signed)
HPI :  Patient is a 64 year old male known to Dr. Fuller Plan. He is referred by PCP for rectal bleeding. Patient has multiple medical problems, on multiple medications. He had a colonoscopy May 2013 for evaluation of hematochezia. Internal hemorrhoids, as well as mild diverticulosis was found.   Patient actually has several GI complaints.   Rectal bleeding - two-month history of rectal bleeding with bowel movements. He has occasional pain with defecation. He takes MiraLAX for constipation but stools still hard.  Chronic right lower quadrant pain. Pain getting worse over last several months. Pain worse after meals but it also hurts to bend over and tie shoe. He was seen in the emergency department a few days ago for abdominal pain. Hemoglobin in ED was 12.1, MCV low at 77. Noncontrast CT scan of the abdomen and pelvis negative for any acute abnormalities.   Nausea and vomiting - present for two months. No recent medication changes. He does takes Nsaids. Also takes narcotics.   Solid food dysphagia.  Swallowing improved after last dilation several years ago. Now dysphagia slowly returning.   Past Medical History  Diagnosis Date  . Multiple myeloma   . Diabetes mellitus without complication   . Multiple myeloma   . Hypertension   . Renal insufficiency   . Diverticulosis   . Hemorrhoids   . Type 2 diabetes mellitus   . Anxiety disorder   . Urinary retention   . Anemia   . Diabetic neuropathy   . Constipation   . Herpes zoster   . Arthritis   . Hyperlipidemia   . Myeloma 12/31/2010  . Arthralgia of multiple joints 12/31/2010  . Anemia, iron deficiency 03/12/2011  . 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 Hypotestosteronism 03/17/2012     Family History  Problem Relation Age of Onset  . Breast cancer Sister   . Breast cancer Mother   . Diabetes Sister   . Bone cancer Father   . Colon cancer Neg Hx   . Heart disease Sister   . Heart disease Mother   . Heart disease Daughter   .  Throat cancer Brother   . Stomach cancer Brother    History  Substance Use Topics  . Smoking status: Never Smoker   . Smokeless tobacco: Never Used     Comment: never used tobacco  . Alcohol Use: No   Current Outpatient Prescriptions  Medication Sig Dispense Refill  . amLODipine (NORVASC) 10 MG tablet Take 10 mg by mouth daily.    Marland Kitchen CARAFATE 1 GM/10ML suspension Take 1 g by mouth 4 (four) times daily.     . diazepam (VALIUM) 5 MG tablet Take 5 mg by mouth every 4 (four) hours as needed for anxiety.    . furosemide (LASIX) 40 MG tablet Take 40 mg by mouth.    Marland Kitchen glipiZIDE (GLUCOTROL) 5 MG tablet Take 5 mg by mouth daily before breakfast.    . Hypromellose (NATURAL BALANCE TEARS OP) Apply 1 drop to eye every morning.     . meclizine (ANTIVERT) 50 MG tablet Take 0.5 tablets (25 mg total) by mouth 4 (four) times daily as needed for dizziness. 30 tablet 0  . meloxicam (MOBIC) 15 MG tablet Take 1 tablet (15 mg total) by mouth daily. 30 tablet 2  . methocarbamol (ROBAXIN) 500 MG tablet Take 500 mg by mouth 2 (two) times daily as needed for muscle spasms.     Marland Kitchen omeprazole (PRILOSEC) 20 MG capsule Take 1 capsule (20 mg  total) by mouth daily. 30 capsule 11  . ondansetron (ZOFRAN ODT) 4 MG disintegrating tablet Take 1 tablet (4 mg total) by mouth every 8 (eight) hours as needed for nausea or vomiting. 10 tablet 0  . OxyCODONE (OXYCONTIN) 80 mg T12A 12 hr tablet Take 80 mg by mouth 3 (three) times daily.    Marland Kitchen oxyCODONE-acetaminophen (PERCOCET) 10-325 MG per tablet Take 1 tablet by mouth every 6 (six) hours as needed for pain.     . polyethylene glycol (MIRALAX / GLYCOLAX) packet Take 17 g by mouth daily.    . Polyvinyl Alcohol-Povidone (CLEAR EYES ALL SEASONS) 5-6 MG/ML SOLN Apply 2 drops to eye 3 (three) times daily as needed (drye eyes.).     Marland Kitchen potassium chloride SA (K-DUR,KLOR-CON) 20 MEQ tablet Take 20 mEq by mouth 2 (two) times daily as needed (takes with furosemide).     . prochlorperazine  (COMPAZINE) 10 MG tablet Take 1 tablet (10 mg total) by mouth every 6 (six) hours as needed for nausea or vomiting. 60 tablet 2  . Tamsulosin HCl (FLOMAX) 0.4 MG CAPS Take 0.4 mg by mouth daily.     Marland Kitchen triamterene-hydrochlorothiazide (MAXZIDE-25) 37.5-25 MG per tablet Take 1 tablet by mouth daily.      . Vitamin D, Ergocalciferol, (DRISDOL) 50000 UNITS CAPS capsule Take 50,000 Units by mouth every 7 (seven) days.    . [DISCONTINUED] potassium chloride (KLOR-CON) 20 MEQ packet Take 20 mEq by mouth daily.      No current facility-administered medications for this visit.   Allergies  Allergen Reactions  . Iodine Rash  . Iodine Anxiety    Didn't feel right "instructed not to take per MD--something with his port"     Review of Systems: All systems reviewed and negative except where noted in HPI.    Ct Abdomen Pelvis Wo Contrast  09/20/2014   CLINICAL DATA:  Nausea and vomiting for 2 months.  Abdominal pain  EXAM: CT ABDOMEN AND PELVIS WITHOUT CONTRAST  TECHNIQUE: Multidetector CT imaging of the abdomen and pelvis was performed following the standard protocol without IV contrast.  COMPARISON:  05/25/2014  FINDINGS: Lower chest: The lung bases stress set there is no pleural effusion identified. Pulmonary nodule in the right lower lobe measures 5 mm, image 1/series 4. This is unchanged from previous exam. Also stable is a 4 mm right lower lobe nodule, image 7/series 4.  Hepatobiliary: No suspicious liver abnormalities. The gallbladder appears normal. No biliary dilatation.  Pancreas: Normal appearance of the pancreas.  Spleen: The spleen is unremarkable.  Adrenals/Urinary Tract: The adrenal glands are both normal. Cyst arising from the inferior pole of the right kidney measures 2.3 cm and is incompletely characterized without IV contrast material. Also incompletely characterized is a 1.5 cm cyst within the interpolar region of the left kidney. No nephrolithiasis or hydronephrosis. The urinary bladder is  normal.  Stomach/Bowel: The stomach is normal. The small bowel loops are on unremarkable.  Vascular/Lymphatic: Normal appearance of the abdominal aorta. No enlarged retroperitoneal or mesenteric adenopathy. No enlarged pelvic or inguinal lymph nodes.  Reproductive: The prostate gland and seminal vesicles appear within normal limits.  Other: There is no ascites or focal fluid collections within the abdomen or pelvis.  Musculoskeletal: Mild degenerative disc disease noted within the lumbar spine.  IMPRESSION: 1. No acute findings within the abdomen or pelvis. No explanation for patient's abdominal pain, nausea and vomiting. 2. No hernia visualize. 3. Small right lower lobe lung nodules are unchanged from 2014  and are compatible with a benign process.   Electronically Signed   By: Kerby Moors M.D.   On: 09/20/2014 16:10    Physical Exam: BP 130/70 mmHg  Pulse 78  Ht _0  (1.803 m)  Wt 198 lb (89.812 kg)  BMI 27.63 kg/m2 Constitutional: Pleasant,well-developed, black male in no acute distress. HEENT: Normocephalic and atraumatic. Conjunctivae are normal. No scleral icterus. Neck supple.  Cardiovascular: Normal rate, regular rhythm.  Pulmonary/chest: Effort normal and breath sounds normal. No wheezing, rales or rhonchi. Abdominal: Soft, nondistended, nontender. Bowel sounds active throughout. There are no masses palpable. No hepatomegaly. Rectal: no external lesions. No masses on DRE. Tight anal sphincter Extremities: no edema Lymphadenopathy: No cervical adenopathy noted. Neurological: Alert and oriented to person place and time. Skin: Skin is warm and dry. No rashes noted. Psychiatric: Normal mood and affect. Behavior is normal.  Manometry Study March 2011 Upper esophageal sphincter study, resting pressure 47.4 mmHg with residual pressure of 2.4 mmHg, 94% relaxation. Normal study.  Lower esophageal body study revealed normal peristalsis with 10/10 swallows, one port had a slightly  elevated amplitude at 262 mmHg, the other ports recorded normal pressures.  Lower esophageal sphincter study, it was an elevated lower esophageal sphincter pressure at 57.3 mmHg. Residual pressure was elevated at 12 mmHg with 79% relaxation.   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   ASSESSMENT AND PLAN:  27. 64 year old male with two-month history of nausea and vomiting. Rule out peptic ulcer disease, patient does take NSAIDs. Blood sugars reportedly well controlled but still need to consider gastroparesis secondary to diabetes and/or narcotics (though he has taken narcotics for a long time without nausea / vomiting). Further evaluation at time of EGD (to be done for dysphagia).   2. Recurrent solid food dysphagia. He had an empirical esophageal dilation in 2010 which he feels did help. 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 Patient interested in repeat esophageal dilation. The benefits, risks, and potential complications of EGD with possible biopsies and/or dilation were discussed with the patient and he agrees to proceed.   3. Chronic RLQ pain. Pain more bothersome lately. It is exacerbated by bending over, getting into bed. Pain sounds musculoskeletal in nature though he does think it is worse after eating. Abdominal exam not overly concerning. Non-contrast CTscan unrevealing. Not convinced this is GI in origin.    4. Rectal bleeding, recurrent. Hemorrhoids on colonoscopy (done for bleeding) 3 years ago. No external lesions on DRE. Bleeding probably hemorrhoidal. Hgb stable at 12.1. Will treat with steroid suppositories. Patient will let us know if bleeding persists. He may need benefit from hemorrhoidal banding. Advised to increase Miralax to bid for constipation.   5. Chronic microcytic anemia. Ferritin actually elevated.   6. Multiple myleoma / CKD   Cc:Gala Romney, MD

## 2014-09-28 NOTE — Progress Notes (Signed)
Reviewed and agree with management plan.  Lashawnda Hancox T. Eliese Kerwood, MD FACG 

## 2014-11-07 ENCOUNTER — Other Ambulatory Visit: Payer: Self-pay | Admitting: *Deleted

## 2014-11-07 ENCOUNTER — Other Ambulatory Visit (HOSPITAL_BASED_OUTPATIENT_CLINIC_OR_DEPARTMENT_OTHER): Payer: Medicare Other

## 2014-11-07 ENCOUNTER — Telehealth: Payer: Self-pay | Admitting: *Deleted

## 2014-11-07 ENCOUNTER — Ambulatory Visit (HOSPITAL_BASED_OUTPATIENT_CLINIC_OR_DEPARTMENT_OTHER): Payer: Medicare Other

## 2014-11-07 ENCOUNTER — Encounter: Payer: Self-pay | Admitting: Hematology & Oncology

## 2014-11-07 ENCOUNTER — Ambulatory Visit (HOSPITAL_BASED_OUTPATIENT_CLINIC_OR_DEPARTMENT_OTHER): Payer: Medicare Other | Admitting: Hematology & Oncology

## 2014-11-07 VITALS — BP 156/78 | HR 89 | Temp 98.0°F | Resp 16 | Ht 71.0 in | Wt 197.0 lb

## 2014-11-07 DIAGNOSIS — E349 Endocrine disorder, unspecified: Secondary | ICD-10-CM

## 2014-11-07 DIAGNOSIS — Z23 Encounter for immunization: Secondary | ICD-10-CM

## 2014-11-07 DIAGNOSIS — C9000 Multiple myeloma not having achieved remission: Secondary | ICD-10-CM

## 2014-11-07 DIAGNOSIS — D631 Anemia in chronic kidney disease: Secondary | ICD-10-CM

## 2014-11-07 DIAGNOSIS — N189 Chronic kidney disease, unspecified: Secondary | ICD-10-CM

## 2014-11-07 DIAGNOSIS — D509 Iron deficiency anemia, unspecified: Secondary | ICD-10-CM

## 2014-11-07 DIAGNOSIS — E291 Testicular hypofunction: Secondary | ICD-10-CM

## 2014-11-07 DIAGNOSIS — E876 Hypokalemia: Secondary | ICD-10-CM

## 2014-11-07 LAB — CMP (CANCER CENTER ONLY)
ALK PHOS: 74 U/L (ref 26–84)
ALT: 32 U/L (ref 10–47)
AST: 33 U/L (ref 11–38)
Albumin: 4 g/dL (ref 3.3–5.5)
BUN, Bld: 43 mg/dL — ABNORMAL HIGH (ref 7–22)
CO2: 30 meq/L (ref 18–33)
CREATININE: 2.6 mg/dL — AB (ref 0.6–1.2)
Calcium: 9.6 mg/dL (ref 8.0–10.3)
Chloride: 95 mEq/L — ABNORMAL LOW (ref 98–108)
GLUCOSE: 125 mg/dL — AB (ref 73–118)
Potassium: 2.8 mEq/L — CL (ref 3.3–4.7)
SODIUM: 136 meq/L (ref 128–145)
Total Bilirubin: 0.5 mg/dl (ref 0.20–1.60)
Total Protein: 9.1 g/dL — ABNORMAL HIGH (ref 6.4–8.1)

## 2014-11-07 LAB — CBC WITH DIFFERENTIAL (CANCER CENTER ONLY)
BASO#: 0.1 10*3/uL (ref 0.0–0.2)
BASO%: 0.7 % (ref 0.0–2.0)
EOS ABS: 0.2 10*3/uL (ref 0.0–0.5)
EOS%: 2.5 % (ref 0.0–7.0)
HEMATOCRIT: 33.9 % — AB (ref 38.7–49.9)
HGB: 11.6 g/dL — ABNORMAL LOW (ref 13.0–17.1)
LYMPH#: 2.3 10*3/uL (ref 0.9–3.3)
LYMPH%: 24.6 % (ref 14.0–48.0)
MCH: 25.6 pg — AB (ref 28.0–33.4)
MCHC: 34.2 g/dL (ref 32.0–35.9)
MCV: 75 fL — AB (ref 82–98)
MONO#: 0.8 10*3/uL (ref 0.1–0.9)
MONO%: 8.9 % (ref 0.0–13.0)
NEUT#: 5.8 10*3/uL (ref 1.5–6.5)
NEUT%: 63.3 % (ref 40.0–80.0)
Platelets: 272 10*3/uL (ref 145–400)
RBC: 4.53 10*6/uL (ref 4.20–5.70)
RDW: 14.8 % (ref 11.1–15.7)
WBC: 9.2 10*3/uL (ref 4.0–10.0)

## 2014-11-07 LAB — FERRITIN CHCC: Ferritin: 2341 ng/ml — ABNORMAL HIGH (ref 22–316)

## 2014-11-07 LAB — IRON AND TIBC CHCC
%SAT: 52 % (ref 20–55)
Iron: 118 ug/dL (ref 42–163)
TIBC: 226 ug/dL (ref 202–409)
UIBC: 109 ug/dL — ABNORMAL LOW (ref 117–376)

## 2014-11-07 MED ORDER — SODIUM CHLORIDE 0.9 % IJ SOLN
10.0000 mL | INTRAMUSCULAR | Status: DC | PRN
  Administered 2014-11-07: 10 mL via INTRAVENOUS
  Filled 2014-11-07: qty 10

## 2014-11-07 MED ORDER — SODIUM CHLORIDE 0.9 % IV SOLN
Freq: Once | INTRAVENOUS | Status: AC
Start: 2014-11-07 — End: 2014-11-07
  Administered 2014-11-07: 11:00:00 via INTRAVENOUS

## 2014-11-07 MED ORDER — MORPHINE SULFATE (PF) 4 MG/ML IV SOLN
INTRAVENOUS | Status: AC
  Filled 2014-11-07: qty 1

## 2014-11-07 MED ORDER — POTASSIUM CHLORIDE CRYS ER 20 MEQ PO TBCR: 20.0000 meq | EXTENDED_RELEASE_TABLET | Freq: Two times a day (BID) | ORAL | Status: DC

## 2014-11-07 MED ORDER — HEPARIN SOD (PORK) LOCK FLUSH 100 UNIT/ML IV SOLN
500.0000 [IU] | Freq: Once | INTRAVENOUS | Status: AC
  Administered 2014-11-07: 500 [IU] via INTRAVENOUS
  Filled 2014-11-07: qty 5

## 2014-11-07 MED ORDER — SODIUM CHLORIDE 0.9 % IV SOLN
60.0000 mg | Freq: Once | INTRAVENOUS | Status: AC
  Administered 2014-11-07: 60 mg via INTRAVENOUS
  Filled 2014-11-07: qty 10

## 2014-11-07 MED ORDER — TESTOSTERONE CYPIONATE 200 MG/ML IM SOLN
300.0000 mg | INTRAMUSCULAR | Status: DC
  Administered 2014-11-07: 300 mg via INTRAMUSCULAR

## 2014-11-07 MED ORDER — TESTOSTERONE CYPIONATE 200 MG/ML IM SOLN
INTRAMUSCULAR | Status: AC
  Filled 2014-11-07: qty 2

## 2014-11-07 MED ORDER — INFLUENZA VAC SPLIT QUAD 0.5 ML IM SUSY
0.5000 mL | PREFILLED_SYRINGE | Freq: Once | INTRAMUSCULAR | Status: AC
  Administered 2014-11-07: 0.5 mL via INTRAMUSCULAR
  Filled 2014-11-07: qty 0.5

## 2014-11-07 MED ORDER — SODIUM CHLORIDE 0.9 % IV SOLN: 60.0000 mg | Freq: Once | INTRAVENOUS | Status: DC

## 2014-11-07 MED ORDER — MORPHINE SULFATE 4 MG/ML IJ SOLN
1.0000 mg | Freq: Once | INTRAMUSCULAR | Status: AC
  Administered 2014-11-07: 1 mg via SUBCUTANEOUS
  Filled 2014-11-07: qty 1

## 2014-11-07 NOTE — Progress Notes (Signed)
Hematology and Oncology Follow Up Visit  [name] [id number] August 04, 1950 64 y.o. 11/07/2014   Principle Diagnosis:  1. IgG kappa myeloma. 2. Anemia secondary to renal insufficiency. 3. Intermittent iron-deficiency anemia. 4. Hypotestosteronemia  Current Therapy:   1. Aredia 60 mg IV q.2 months  2. Aranesp 300 mcg subcu as needed for hemoglobin less than 11. 3. Depo-Testosterone 300 mg q.2 weeks. 4. IV iron as indicated.     Interim History:  [name] is back for followup. He is having some issues with abdominal pain. He does see Dr. Fuller Plan of gastroenterology. It sounds like he may have Helicobacter. I suspect that he may need to have an upper GI or upper endoscopy.  He does have the arthralgias. He does have diabetes. He does have the low testosterone.  His myeloma has not been an issue. He's had smoldering myeloma that we've not had a treat. His last myeloma studies done back in July showed an SPEP with an M spike of 0.9 g/dL. His IgG level was 2160 mg/dL. His Kappa Light chain was 6.01 mg/dL.  His biggest issues deal with his diabetes and his blood sugars. They have been a problem area and he tries his best to keep these under control.   Medications:  Current outpatient prescriptions:  .  amLODipine (NORVASC) 10 MG tablet, Take 10 mg by mouth daily., Disp: , Rfl:  .  CARAFATE 1 GM/10ML suspension, Take 1 g by mouth 4 (four) times daily. , Disp: , Rfl:  .  diazepam (VALIUM) 5 MG tablet, Take 5 mg by mouth every 4 (four) hours as needed for anxiety., Disp: , Rfl:  .  furosemide (LASIX) 40 MG tablet, Take 40 mg by mouth., Disp: , Rfl:  .  glipiZIDE (GLUCOTROL) 5 MG tablet, Take 5 mg by mouth daily before breakfast., Disp: , Rfl:  .  hydrocortisone (ANUSOL-HC) 2.5 % rectal cream, Use 1 application rectally at bedtime for 7 days., Disp: 30 g, Rfl: 0 .  Hypromellose (NATURAL BALANCE TEARS OP), Apply 1 drop to eye every morning. , Disp: , Rfl:  .  meclizine (ANTIVERT) 50 MG  tablet, Take 0.5 tablets (25 mg total) by mouth 4 (four) times daily as needed for dizziness., Disp: 30 tablet, Rfl: 0 .  meloxicam (MOBIC) 15 MG tablet, Take 1 tablet (15 mg total) by mouth daily., Disp: 30 tablet, Rfl: 2 .  methocarbamol (ROBAXIN) 500 MG tablet, Take 500 mg by mouth 2 (two) times daily as needed for muscle spasms. , Disp: , Rfl:  .  omeprazole (PRILOSEC) 20 MG capsule, Take 1 capsule (20 mg total) by mouth daily., Disp: 30 capsule, Rfl: 11 .  ondansetron (ZOFRAN ODT) 4 MG disintegrating tablet, Take 1 tablet (4 mg total) by mouth every 8 (eight) hours as needed for nausea or vomiting., Disp: 10 tablet, Rfl: 0 .  OxyCODONE (OXYCONTIN) 80 mg T12A 12 hr tablet, Take 80 mg by mouth 3 (three) times daily., Disp: , Rfl:  .  oxyCODONE-acetaminophen (PERCOCET) 10-325 MG per tablet, Take 1 tablet by mouth every 6 (six) hours as needed for pain. , Disp: , Rfl:  .  polyethylene glycol (MIRALAX / GLYCOLAX) packet, Take 17 g by mouth daily., Disp: , Rfl:  .  Polyvinyl Alcohol-Povidone (CLEAR EYES ALL SEASONS) 5-6 MG/ML SOLN, Apply 2 drops to eye 3 (three) times daily as needed (drye eyes.). , Disp: , Rfl:  .  prochlorperazine (COMPAZINE) 10 MG tablet, Take 1 tablet (10 mg total) by mouth every  6 (six) hours as needed for nausea or vomiting., Disp: 60 tablet, Rfl: 2 .  Tamsulosin HCl (FLOMAX) 0.4 MG CAPS, Take 0.4 mg by mouth daily. , Disp: , Rfl:  .  triamterene-hydrochlorothiazide (MAXZIDE-25) 37.5-25 MG per tablet, Take 1 tablet by mouth daily.  , Disp: , Rfl:  .  Vitamin D, Ergocalciferol, (DRISDOL) 50000 UNITS CAPS capsule, Take 50,000 Units by mouth every 7 (seven) days., Disp: , Rfl:  .  potassium chloride SA (K-DUR,KLOR-CON) 20 MEQ tablet, Take 1 tablet (20 mEq total) by mouth 2 (two) times daily., Disp: 60 tablet, Rfl: 6 .  [DISCONTINUED] potassium chloride (KLOR-CON) 20 MEQ packet, Take 20 mEq by mouth daily. , Disp: , Rfl:   Current facility-administered medications:  .  morphine 4  MG/ML injection 1 mg, 1 mg, Subcutaneous, Once, Volanda Napoleon, MD  Facility-Administered Medications Ordered in Other Visits:  .  pamidronate (AREDIA) 60 mg in sodium chloride 0.9 % 500 mL IVPB, 60 mg, Intravenous, Once, Volanda Napoleon, MD, Last Rate: 130 mL/hr at 11/07/14 1148, 60 mg at 11/07/14 1148 .  testosterone cypionate (DEPOTESTOSTERONE CYPIONATE) injection 300 mg, 300 mg, Intramuscular, Q14 Days, Volanda Napoleon, MD, 300 mg at 11/07/14 1037  Allergies:  Allergies  Allergen Reactions  . Iodine Rash  . Iodine Anxiety    Didn't feel right "instructed not to take per MD--something with his port"    Past Medical History, Surgical history, Social history, and Family History were reviewed and updated.  Review of Systems: As above  Physical Exam:  height is 5\' 11"  (1.803 m) and weight is 197 lb (89.359 kg). His oral temperature is 98 F (36.7 C). His blood pressure is 156/78 and his pulse is 89. His respiration is 16.   His head and neck exam shows no ocular or oral lesions. He has no adenopathy in the neck. His thyroid is non-palpable. Lungs are clear. Cardiac exam regular rate and rhythm with no murmurs, rubs or bruits.. Abdomen is soft. His abdomen is slightly distended. He has good bowel sounds. There is no fluid wave. There is no palpable liver or spleen tip. Back exam shows no tenderness in the lumbar spine. There is no paravertebral muscle spasms. . He has slightly decreased range of motion of the lower back. Extremities shows no clubbing cyanosis or edema. He has decent strength in his legs. Skin exam shows no rashes. Neurological exam is nonfocal.  Lab Results  Component Value Date   WBC 9.2 11/07/2014   HGB 11.6* 11/07/2014   HCT 33.9* 11/07/2014   MCV 75* 11/07/2014   PLT 272 11/07/2014     Chemistry      Component Value Date/Time   NA 136 11/07/2014 0914   NA 136 09/20/2014 1411   NA 137 08/23/2012 2105   K 2.8* 11/07/2014 0914   K 3.3* 09/20/2014 1411   K  4.0 08/23/2012 2105   CL 95* 11/07/2014 0914   CL 99* 09/20/2014 1411   CL 101 08/23/2012 2105   CO2 30 11/07/2014 0914   CO2 30 09/20/2014 1411   CO2 32 08/23/2012 2105   BUN 43* 11/07/2014 0914   BUN 34* 09/20/2014 1411   BUN 23* 08/23/2012 2105   CREATININE 2.6* 11/07/2014 0914   CREATININE 2.29* 09/20/2014 1411   CREATININE 2.53* 08/23/2012 2105      Component Value Date/Time   CALCIUM 9.6 11/07/2014 0914   CALCIUM 9.5 09/20/2014 1411   CALCIUM 8.7 08/23/2012 2105   ALKPHOS 74  11/07/2014 0914   ALKPHOS 71 09/20/2014 1411   ALKPHOS 101 08/23/2012 2105   AST 33 11/07/2014 0914   AST 22 09/20/2014 1411   AST 27 08/23/2012 2105   ALT 32 11/07/2014 0914   ALT 19 09/20/2014 1411   ALT 21 08/23/2012 2105   BILITOT 0.50 11/07/2014 0914   BILITOT 0.5 09/20/2014 1411   BILITOT 0.2 08/23/2012 2105         Impression and Plan: [name] is a 64 year old gentleman. He has multiple medical problems. I still do not think that myeloma is an issue. He has fibromyalgia. He has diabetes. He has low testosterone.  We will go ahead and give him Aredia. He gets it every 2 months.  I think the testosterone is helping him and also helping with his anemia.  We will go ahead and give him testosterone today.  I'll plan to see him back in about 2 months  . Volanda Napoleon, MD 9/14/20161:32 PM

## 2014-11-07 NOTE — Telephone Encounter (Signed)
Critical Value Potassium 2.8 Dr [name] notified, orders received.

## 2014-11-07 NOTE — Patient Instructions (Signed)
Pamidronate injection What is this medicine? PAMIDRONATE (pa mi DROE nate) slows calcium loss from bones. It is used to treat high calcium blood levels from cancer or Paget's disease. It is also used to treat bone pain and prevent fractures from certain cancers that have spread to the bone. This medicine may be used for other purposes; ask your health care provider or pharmacist if you have questions. COMMON BRAND NAME(S): Aredia What should I tell my health care provider before I take this medicine? They need to know if you have any of these conditions: -aspirin-sensitive asthma -dental disease -kidney disease -an unusual or allergic reaction to pamidronate, other medicines, foods, dyes, or preservatives -pregnant or trying to get pregnant -breast-feeding How should I use this medicine? This medicine is for infusion into a vein. It is given by a health care professional in a hospital or clinic setting. Talk to your pediatrician regarding the use of this medicine in children. This medicine is not approved for use in children. Overdosage: If you think you have taken too much of this medicine contact a poison control center or emergency room at once. NOTE: This medicine is only for you. Do not share this medicine with others. What if I miss a dose? This does not apply. What may interact with this medicine? -certain antibiotics given by injection -medicines for inflammation or pain like ibuprofen, naproxen -some diuretics like bumetanide, furosemide -cyclosporine -parathyroid hormone -tacrolimus -teriparatide -thalidomide This list may not describe all possible interactions. Give your health care provider a list of all the medicines, herbs, non-prescription drugs, or dietary supplements you use. Also tell them if you smoke, drink alcohol, or use illegal drugs. Some items may interact with your medicine. What should I watch for while using this medicine? Visit your doctor or health care  professional for regular checkups. It may be some time before you see the benefit from this medicine. Do not stop taking your medicine unless your doctor tells you to. Your doctor may order blood tests or other tests to see how you are doing. Women should inform their doctor if they wish to become pregnant or think they might be pregnant. There is a potential for serious side effects to an unborn child. Talk to your health care professional or pharmacist for more information. You should make sure that you get enough calcium and vitamin D while you are taking this medicine. Discuss the foods you eat and the vitamins you take with your health care professional. Some people who take this medicine have severe bone, joint, and/or muscle pain. This medicine may also increase your risk for a broken thigh bone. Tell your doctor right away if you have pain in your upper leg or groin. Tell your doctor if you have any pain that does not go away or that gets worse. What side effects may I notice from receiving this medicine? Side effects that you should report to your doctor or health care professional as soon as possible: -allergic reactions like skin rash, itching or hives, swelling of the face, lips, or tongue -black or tarry stools -changes in vision -eye inflammation, pain -high blood pressure -jaw pain, especially burning or cramping -muscle weakness -numb, tingling pain -swelling of feet or hands -trouble passing urine or change in the amount of urine -unable to move easily Side effects that usually do not require medical attention (report to your doctor or health care professional if they continue or are bothersome): -bone, joint, or muscle pain -constipation -dizzy, drowsy -  fever -headache -loss of appetite -nausea, vomiting -pain at site where injected This list may not describe all possible side effects. Call your doctor for medical advice about side effects. You may report side effects to  FDA at 1-800-FDA-1088. Where should I keep my medicine? This drug is given in a hospital or clinic and will not be stored at home. NOTE: This sheet is a summary. It may not cover all possible information. If you have questions about this medicine, talk to your doctor, pharmacist, or health care provider.  2015, Elsevier/Gold Standard. (2010-08-08 08:49:49)   Testosterone injection What is this medicine? TESTOSTERONE (tes TOS ter one) is the main male hormone. It supports normal male development such as muscle growth, facial hair, and deep voice. It is used in males to treat low testosterone levels. This medicine may be used for other purposes; ask your health care provider or pharmacist if you have questions. COMMON BRAND NAME(S): Andro-L.A., Aveed, Delatestryl, Depo-Testosterone, Virilon What should I tell my health care provider before I take this medicine? They need to know if you have any of these conditions: -breast cancer -diabetes -heart disease -kidney disease -liver disease -lung disease -prostate cancer, enlargement -an unusual or allergic reaction to testosterone, other medicines, foods, dyes, or preservatives -pregnant or trying to get pregnant -breast-feeding How should I use this medicine? This medicine is for injection into a muscle. It is usually given by a health care professional in a hospital or clinic setting. Contact your pediatrician regarding the use of this medicine in children. While this medicine may be prescribed for children as young as 40 years of age for selected conditions, precautions do apply. Overdosage: If you think you have taken too much of this medicine contact a poison control center or emergency room at once. NOTE: This medicine is only for you. Do not share this medicine with others. What if I miss a dose? Try not to miss a dose. Your doctor or health care professional will tell you when your next injection is due. Notify the office if you are  unable to keep an appointment. What may interact with this medicine? -medicines for diabetes -medicines that treat or prevent blood clots like warfarin -oxyphenbutazone -propranolol -steroid medicines like prednisone or cortisone This list may not describe all possible interactions. Give your health care provider a list of all the medicines, herbs, non-prescription drugs, or dietary supplements you use. Also tell them if you smoke, drink alcohol, or use illegal drugs. Some items may interact with your medicine. What should I watch for while using this medicine? Visit your doctor or health care professional for regular checks on your progress. They will need to check the level of testosterone in your blood. This medicine is only approved for use in men who have low levels of testosterone related to certain medical conditions. Heart attacks and strokes have been reported with the use of this medicine. Notify your doctor or health care professional and seek emergency treatment if you develop breathing problems; changes in vision; confusion; chest pain or chest tightness; sudden arm pain; severe, sudden headache; trouble speaking or understanding; sudden numbness or weakness of the face, arm or leg; loss of balance or coordination. Talk to your doctor about the risks and benefits of this medicine. This medicine may affect blood sugar levels. If you have diabetes, check with your doctor or health care professional before you change your diet or the dose of your diabetic medicine. This drug is banned from use in athletes  by most athletic organizations. What side effects may I notice from receiving this medicine? Side effects that you should report to your doctor or health care professional as soon as possible: -allergic reactions like skin rash, itching or hives, swelling of the face, lips, or tongue -breast enlargement -breathing problems -changes in mood, especially anger, depression, or rage -dark  urine -general ill feeling or flu-like symptoms -light-colored stools -loss of appetite, nausea -nausea, vomiting -right upper belly pain -stomach pain -swelling of ankles -too frequent or persistent erections -trouble passing urine or change in the amount of urine -unusually weak or tired -yellowing of the eyes or skin Additional side effects that can occur in women include: -deep or hoarse voice -facial hair growth -irregular menstrual periods Side effects that usually do not require medical attention (report to your doctor or health care professional if they continue or are bothersome): -acne -change in sex drive or performance -hair loss -headache This list may not describe all possible side effects. Call your doctor for medical advice about side effects. You may report side effects to FDA at 1-800-FDA-1088. Where should I keep my medicine? Keep out of the reach of children. This medicine can be abused. Keep your medicine in a safe place to protect it from theft. Do not share this medicine with anyone. Selling or giving away this medicine is dangerous and against the law. Store at room temperature between 20 and 25 degrees C (68 and 77 degrees F). Do not freeze. Protect from light. Follow the directions for the product you are prescribed. Throw away any unused medicine after the expiration date. NOTE: This sheet is a summary. It may not cover all possible information. If you have questions about this medicine, talk to your doctor, pharmacist, or health care provider.  2015, Elsevier/Gold Standard. (2013-04-27 [id number])

## 2014-11-12 LAB — SPEP & IFE WITH QIG
ABNORMAL PROTEIN BAND1: 0.8 g/dL
ALPHA-1-GLOBULIN: 0.3 g/dL (ref 0.2–0.3)
ALPHA-2-GLOBULIN: 1 g/dL — AB (ref 0.5–0.9)
Albumin ELP: 4.2 g/dL (ref 3.8–4.8)
Beta 2: 0.5 g/dL (ref 0.2–0.5)
Beta Globulin: 0.4 g/dL (ref 0.4–0.6)
GAMMA GLOBULIN: 2 g/dL — AB (ref 0.8–1.7)
IgA: 430 mg/dL — ABNORMAL HIGH (ref 68–379)
IgG (Immunoglobin G), Serum: 2370 mg/dL — ABNORMAL HIGH (ref 650–1600)
IgM, Serum: 86 mg/dL (ref 41–251)
Total Protein, Serum Electrophoresis: 8.4 g/dL — ABNORMAL HIGH (ref 6.1–8.1)

## 2014-11-12 LAB — KAPPA/LAMBDA LIGHT CHAINS
KAPPA LAMBDA RATIO: 1.76 — AB (ref 0.26–1.65)
Kappa free light chain: 5.3 mg/dL — ABNORMAL HIGH (ref 0.33–1.94)
LAMBDA FREE LGHT CHN: 3.01 mg/dL — AB (ref 0.57–2.63)

## 2014-11-14 ENCOUNTER — Inpatient Hospital Stay (HOSPITAL_COMMUNITY)
Admission: EM | Admit: 2014-11-14 | Discharge: 2014-11-16 | DRG: 392 | Disposition: A | Discharge status: Home / Self Care | Payer: Medicare Other | Attending: Internal Medicine | Admitting: Internal Medicine

## 2014-11-14 ENCOUNTER — Ambulatory Visit (AMBULATORY_SURGERY_CENTER): Payer: Medicare Other | Admitting: Gastroenterology

## 2014-11-14 ENCOUNTER — Encounter: Payer: Self-pay | Admitting: Gastroenterology

## 2014-11-14 ENCOUNTER — Observation Stay (HOSPITAL_COMMUNITY): Payer: Medicare Other

## 2014-11-14 ENCOUNTER — Encounter (HOSPITAL_COMMUNITY): Payer: Self-pay | Admitting: *Deleted

## 2014-11-14 ENCOUNTER — Emergency Department (HOSPITAL_COMMUNITY): Payer: Medicare Other

## 2014-11-14 VITALS — BP 160/90 | HR 82 | Temp 98.2°F | Resp 19 | Ht 71.0 in | Wt 197.0 lb

## 2014-11-14 DIAGNOSIS — D638 Anemia in other chronic diseases classified elsewhere: Secondary | ICD-10-CM | POA: Diagnosis present

## 2014-11-14 DIAGNOSIS — C9000 Multiple myeloma not having achieved remission: Secondary | ICD-10-CM | POA: Insufficient documentation

## 2014-11-14 DIAGNOSIS — Z79899 Other long term (current) drug therapy: Secondary | ICD-10-CM

## 2014-11-14 DIAGNOSIS — I129 Hypertensive chronic kidney disease with stage 1 through stage 4 chronic kidney disease, or unspecified chronic kidney disease: Secondary | ICD-10-CM | POA: Diagnosis present

## 2014-11-14 DIAGNOSIS — IMO0002 Reserved for concepts with insufficient information to code with codable children: Secondary | ICD-10-CM

## 2014-11-14 DIAGNOSIS — R1030 Lower abdominal pain, unspecified: Secondary | ICD-10-CM | POA: Insufficient documentation

## 2014-11-14 DIAGNOSIS — R1031 Right lower quadrant pain: Principal | ICD-10-CM | POA: Diagnosis present

## 2014-11-14 DIAGNOSIS — R1310 Dysphagia, unspecified: Secondary | ICD-10-CM | POA: Diagnosis present

## 2014-11-14 DIAGNOSIS — R1319 Other dysphagia: Secondary | ICD-10-CM | POA: Insufficient documentation

## 2014-11-14 DIAGNOSIS — E1140 Type 2 diabetes mellitus with diabetic neuropathy, unspecified: Secondary | ICD-10-CM | POA: Diagnosis present

## 2014-11-14 DIAGNOSIS — K5900 Constipation, unspecified: Secondary | ICD-10-CM | POA: Diagnosis present

## 2014-11-14 DIAGNOSIS — K220 Achalasia of cardia: Secondary | ICD-10-CM

## 2014-11-14 DIAGNOSIS — E785 Hyperlipidemia, unspecified: Secondary | ICD-10-CM | POA: Diagnosis present

## 2014-11-14 DIAGNOSIS — K224 Dyskinesia of esophagus: Secondary | ICD-10-CM | POA: Insufficient documentation

## 2014-11-14 DIAGNOSIS — R1314 Dysphagia, pharyngoesophageal phase: Secondary | ICD-10-CM

## 2014-11-14 DIAGNOSIS — R1084 Generalized abdominal pain: Secondary | ICD-10-CM | POA: Diagnosis not present

## 2014-11-14 DIAGNOSIS — F419 Anxiety disorder, unspecified: Secondary | ICD-10-CM | POA: Diagnosis present

## 2014-11-14 DIAGNOSIS — D509 Iron deficiency anemia, unspecified: Secondary | ICD-10-CM | POA: Diagnosis present

## 2014-11-14 DIAGNOSIS — Z79891 Long term (current) use of opiate analgesic: Secondary | ICD-10-CM

## 2014-11-14 DIAGNOSIS — K449 Diaphragmatic hernia without obstruction or gangrene: Secondary | ICD-10-CM | POA: Diagnosis present

## 2014-11-14 DIAGNOSIS — K5790 Diverticulosis of intestine, part unspecified, without perforation or abscess without bleeding: Secondary | ICD-10-CM | POA: Diagnosis present

## 2014-11-14 DIAGNOSIS — N189 Chronic kidney disease, unspecified: Secondary | ICD-10-CM | POA: Diagnosis present

## 2014-11-14 DIAGNOSIS — R109 Unspecified abdominal pain: Secondary | ICD-10-CM | POA: Diagnosis present

## 2014-11-14 DIAGNOSIS — G8929 Other chronic pain: Secondary | ICD-10-CM | POA: Diagnosis present

## 2014-11-14 LAB — COMPREHENSIVE METABOLIC PANEL
ALK PHOS: 53 U/L (ref 38–126)
ALT: 23 U/L (ref 17–63)
AST: 21 U/L (ref 15–41)
Albumin: 4.1 g/dL (ref 3.5–5.0)
Anion gap: 9 (ref 5–15)
BILIRUBIN TOTAL: 0.5 mg/dL (ref 0.3–1.2)
BUN: 29 mg/dL — AB (ref 6–20)
CALCIUM: 8.6 mg/dL — AB (ref 8.9–10.3)
CO2: 27 mmol/L (ref 22–32)
CREATININE: 2.2 mg/dL — AB (ref 0.61–1.24)
Chloride: 103 mmol/L (ref 101–111)
GFR calc Af Amer: 34 mL/min — ABNORMAL LOW (ref >60)
GFR, EST NON AFRICAN AMERICAN: 30 mL/min — AB (ref >60)
Glucose, Bld: 113 mg/dL — ABNORMAL HIGH (ref 65–99)
POTASSIUM: 3.2 mmol/L — AB (ref 3.5–5.1)
Sodium: 139 mmol/L (ref 135–145)
TOTAL PROTEIN: 8.2 g/dL — AB (ref 6.5–8.1)

## 2014-11-14 LAB — GLUCOSE, CAPILLARY
GLUCOSE-CAPILLARY: 127 mg/dL — AB (ref 65–99)
Glucose-Capillary: 114 mg/dL — ABNORMAL HIGH (ref 65–99)

## 2014-11-14 LAB — CBC WITH DIFFERENTIAL/PLATELET
BASOS PCT: 0 %
Basophils Absolute: 0 10*3/uL (ref 0.0–0.1)
Eosinophils Absolute: 0.2 10*3/uL (ref 0.0–0.7)
Eosinophils Relative: 2 %
HEMATOCRIT: 32.1 % — AB (ref 39.0–52.0)
HEMOGLOBIN: 10.5 g/dL — AB (ref 13.0–17.0)
LYMPHS ABS: 2.3 10*3/uL (ref 0.7–4.0)
Lymphocytes Relative: 28 %
MCH: 25.1 pg — AB (ref 26.0–34.0)
MCHC: 32.7 g/dL (ref 30.0–36.0)
MCV: 76.6 fL — ABNORMAL LOW (ref 78.0–100.0)
MONOS PCT: 10 %
Monocytes Absolute: 0.8 10*3/uL (ref 0.1–1.0)
NEUTROS ABS: 4.9 10*3/uL (ref 1.7–7.7)
NEUTROS PCT: 60 %
Platelets: 273 10*3/uL (ref 150–400)
RBC: 4.19 MIL/uL — ABNORMAL LOW (ref 4.22–5.81)
RDW: 15.8 % — ABNORMAL HIGH (ref 11.5–15.5)
WBC: 8.2 10*3/uL (ref 4.0–10.5)

## 2014-11-14 LAB — PROTIME-INR
INR: 1.1 (ref 0.00–1.49)
PROTHROMBIN TIME: 14.4 s (ref 11.6–15.2)

## 2014-11-14 LAB — I-STAT CHEM 8, ED
BUN: 31 mg/dL — AB (ref 6–20)
CHLORIDE: 103 mmol/L (ref 101–111)
CREATININE: 2.2 mg/dL — AB (ref 0.61–1.24)
Calcium, Ion: 1.11 mmol/L — ABNORMAL LOW (ref 1.13–1.30)
Glucose, Bld: 110 mg/dL — ABNORMAL HIGH (ref 65–99)
HEMATOCRIT: 35 % — AB (ref 39.0–52.0)
Hemoglobin: 11.9 g/dL — ABNORMAL LOW (ref 13.0–17.0)
Potassium: 3.2 mmol/L — ABNORMAL LOW (ref 3.5–5.1)
Sodium: 141 mmol/L (ref 135–145)
TCO2: 27 mmol/L (ref 0–100)

## 2014-11-14 MED ORDER — DEXTROSE-NACL 5-0.45 % IV SOLN: INTRAVENOUS | Status: DC

## 2014-11-14 MED ORDER — SODIUM CHLORIDE 0.9 % IV BOLUS (SEPSIS)
500.0000 mL | Freq: Once | INTRAVENOUS | Status: AC
  Administered 2014-11-14: 500 mL via INTRAVENOUS

## 2014-11-14 MED ORDER — SODIUM CHLORIDE 0.9 % IV SOLN: 500.0000 mL | INTRAVENOUS | Status: DC

## 2014-11-14 MED ORDER — ONDANSETRON HCL 4 MG/2ML IJ SOLN
4.0000 mg | Freq: Once | INTRAMUSCULAR | Status: AC
  Administered 2014-11-14: 4 mg via INTRAVENOUS
  Filled 2014-11-14: qty 2

## 2014-11-14 MED ORDER — IOHEXOL 300 MG/ML  SOLN
150.0000 mL | Freq: Once | INTRAMUSCULAR | Status: DC | PRN
  Administered 2014-11-14: 150 mL via ORAL
  Filled 2014-11-14: qty 150

## 2014-11-14 MED ORDER — ONDANSETRON HCL 4 MG/2ML IJ SOLN
4.0000 mg | Freq: Four times a day (QID) | INTRAMUSCULAR | Status: DC | PRN
Start: 2014-11-14 — End: 2014-11-16

## 2014-11-14 MED ORDER — ACETAMINOPHEN 325 MG PO TABS
650.0000 mg | ORAL_TABLET | Freq: Four times a day (QID) | ORAL | Status: DC | PRN
  Administered 2014-11-16: 650 mg via ORAL
  Filled 2014-11-14: qty 2

## 2014-11-14 MED ORDER — HYDROMORPHONE HCL 1 MG/ML IJ SOLN
1.0000 mg | Freq: Once | INTRAMUSCULAR | Status: AC
Start: 2014-11-14 — End: 2014-11-14
  Administered 2014-11-14: 1 mg via INTRAVENOUS
  Filled 2014-11-14: qty 1

## 2014-11-14 MED ORDER — KCL IN DEXTROSE-NACL 20-5-0.45 MEQ/L-%-% IV SOLN
INTRAVENOUS | Status: DC
  Administered 2014-11-15 – 2014-11-16 (×3): via INTRAVENOUS
  Filled 2014-11-14 (×5): qty 1000

## 2014-11-14 MED ORDER — ACETAMINOPHEN 650 MG RE SUPP
650.0000 mg | Freq: Four times a day (QID) | RECTAL | Status: DC | PRN
Start: 2014-11-14 — End: 2014-11-16

## 2014-11-14 MED ORDER — PANTOPRAZOLE SODIUM 40 MG IV SOLR
40.0000 mg | INTRAVENOUS | Status: DC
  Administered 2014-11-14 – 2014-11-15 (×2): 40 mg via INTRAVENOUS
  Filled 2014-11-14 (×3): qty 40

## 2014-11-14 MED ORDER — SODIUM CHLORIDE 0.9 % IJ SOLN
10.0000 mL | INTRAMUSCULAR | Status: DC | PRN
  Administered 2014-11-14 – 2014-11-16 (×3): 10 mL
  Filled 2014-11-14 (×3): qty 40

## 2014-11-14 MED ORDER — ONDANSETRON HCL 4 MG PO TABS: 4.0000 mg | ORAL_TABLET | Freq: Four times a day (QID) | ORAL | Status: DC | PRN

## 2014-11-14 MED ORDER — HYDROMORPHONE HCL 1 MG/ML IJ SOLN
1.0000 mg | INTRAMUSCULAR | Status: DC | PRN
  Administered 2014-11-14 – 2014-11-16 (×8): 1 mg via INTRAVENOUS
  Filled 2014-11-14 (×8): qty 1

## 2014-11-14 NOTE — Patient Instructions (Signed)
Impressions/recommendations:  No abnormalities.  Post dilation diet.  YOU HAD AN ENDOSCOPIC PROCEDURE TODAY AT [facility]:   Refer to the procedure report that was given to you for any specific questions about what was found during the examination.  If the procedure report does not answer your questions, please call your gastroenterologist to clarify.  If you requested that your care partner not be given the details of your procedure findings, then the procedure report has been included in a sealed envelope for you to review at your convenience later.  YOU SHOULD EXPECT: Some feelings of bloating in the abdomen. Passage of more gas than usual.  Walking can help get rid of the air that was put into your GI tract during the procedure and reduce the bloating. If you had a lower endoscopy (such as a colonoscopy or flexible sigmoidoscopy) you may notice spotting of blood in your stool or on the toilet paper. If you underwent a bowel prep for your procedure, you may not have a normal bowel movement for a few days.  Please Note:  You might notice some irritation and congestion in your nose or some drainage.  This is from the oxygen used during your procedure.  There is no need for concern and it should clear up in a day or so.  SYMPTOMS TO REPORT IMMEDIATELY:   Following upper endoscopy (EGD)  Vomiting of blood or coffee ground material  New chest pain or pain under the shoulder blades  Painful or persistently difficult swallowing  New shortness of breath  Fever of 100F or higher  Black, tarry-looking stools  For urgent or emergent issues, a gastroenterologist can be reached at any hour by calling [phone].   DIET: Your first meal following the procedure should be a small meal and then it is ok to progress to your normal diet. Heavy or fried foods are harder to digest and may make you feel nauseous or bloated.  Likewise, meals heavy in dairy and vegetables can increase  bloating.  Drink plenty of fluids but you should avoid alcoholic beverages for 24 hours.  ACTIVITY:  You should plan to take it easy for the rest of today and you should NOT DRIVE or use heavy machinery until tomorrow (because of the sedation medicines used during the test).    FOLLOW UP: Our staff will call the number listed on your records the next business day following your procedure to check on you and address any questions or concerns that you may have regarding the information given to you following your procedure. If we do not reach you, we will leave a message.  However, if you are feeling well and you are not experiencing any problems, there is no need to return our call.  We will assume that you have returned to your regular daily activities without incident.  If any biopsies were taken you will be contacted by phone or by letter within the next 1-3 weeks.  Please call us at [phone] if you have not heard about the biopsies in 3 weeks.    SIGNATURES/CONFIDENTIALITY: You and/or your care partner have signed paperwork which will be entered into your electronic medical record.  These signatures attest to the fact that that the information above on your After Visit Summary has been reviewed and is understood.  Full responsibility of the confidentiality of this discharge information lies with you and/or your care-partner.

## 2014-11-14 NOTE — ED Notes (Signed)
Pt being sent from endo. Pt was being sat up getting ready for discharge and started having abd pain 10/10 and abd distention.

## 2014-11-14 NOTE — ED Notes (Signed)
Pt had esophageal dilation at [facility] today.  Upon discharging patient, patient sat up in bed and had sudden onset of abdominal pain and pressure.  Pt states pain has been constant but changing in severity. Pt denies other complaints.

## 2014-11-14 NOTE — Progress Notes (Signed)
Patient without complaints throughout recovery period. Sat up to get dressed to go home and patient instantly complaining of 10/10 abdominal pain and abdomen tight and distended very tender to touch. Dr. Fuller Plan notified, evaluated and asked for patient to be transferred to [facility] ED for evaluation. Report called to charge nurse, EMS notified. EMS arrived, report given and patient transferred at 1552.

## 2014-11-14 NOTE — Op Note (Addendum)
[place]  [address] [place], [id number]   ENDOSCOPY PROCEDURE REPORT  PATIENT: Adrian, [name]  MR#: [id number] BIRTHDATE: 05-14-1950 , 64  yrs. old GENDER: male ENDOSCOPIST: Ladene Artist, MD, [facility] PROCEDURE DATE:  11/14/2014 PROCEDURE:  EGD w/ wire guided (savary) dilation ASA CLASS:     Class III INDICATIONS:  dysphagia.  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  The [id number] endoscope was introduced through the mouth and advanced to the second portion of the duodenum , Without limitations.  The instrument was slowly withdrawn as the mucosa was fully examined.    ESOPHAGUS: The mucosa of the esophagus appeared normal. No stricture noted. The esophagus was dilated using a 60mm (51Fr) savary dilator over guidewire given dysphagia.  Minimal resistance and no heme. STOMACH: The mucosa of the stomach appeared normal. DUODENUM: The duodenal mucosa showed no abnormalities.  Retroflexed views revealed no abnormalities.  The scope was then withdrawn from the patient and the procedure completed.  COMPLICATIONS: There were no immediate complications.  ENDOSCOPIC IMPRESSION: 1.   The mucosa of the esophagus appeared normal; esophagus was dilated using a savary dilator over guidewire 2.   The mucosa of the stomach appeared normal 3.   The duodenal mucosa showed no abnormalities  RECOMMENDATIONS: 1.  Post dilation instructions  eSigned:  Ladene Artist, MD, [facility] 11/14/2014 5:07 PM Revised: 11/14/2014 5:07 PM

## 2014-11-14 NOTE — Progress Notes (Signed)
Called to room to assist during endoscopic procedure.  Patient ID and intended procedure confirmed with present staff. Received instructions for my participation in the procedure from the performing physician.  

## 2014-11-14 NOTE — ED Notes (Signed)
Pt coming from [place] after an esophageal dilation for dysphagia. States that when he sat up he got abdominal pain and distention. Alert and oriented.

## 2014-11-14 NOTE — ED Notes (Signed)
Pt requesting labs drawn with port access.

## 2014-11-14 NOTE — ED Notes (Signed)
Pt was held in ED to await CT scan - CT informed that wait would be approx another hour.  Pt will be transported to inpatient unit at this time.

## 2014-11-14 NOTE — ED Notes (Signed)
Bed: WA03 Expected date:  Expected time:  Means of arrival:  Comments: Ems- from endo

## 2014-11-14 NOTE — ED Provider Notes (Signed)
CSN: [id number]     Arrival date & time 11/14/14  1558 History   First MD Initiated Contact with Patient 11/14/14 1605     Chief Complaint  Patient presents with  . Abdominal Pain     (Consider location/radiation/quality/duration/timing/severity/associated sxs/prior Treatment) Patient is a 64 y.o. male presenting with abdominal pain. The history is provided by the patient (The patient states he had a procedure at the GI office today a stress to his esophagus. Now is having severe abdominal pain he was sent over here from the office for evaluation.).  Abdominal Pain Pain location:  Generalized Pain quality: aching   Pain radiates to:  Does not radiate Pain severity:  Moderate Onset quality:  Sudden Timing:  Constant Progression:  Waxing and waning Chronicity:  New Associated symptoms: no chest pain, no cough, no diarrhea, no fatigue and no hematuria     Past Medical History  Diagnosis Date  . Multiple myeloma   . Diabetes mellitus without complication   . Multiple myeloma   . Hypertension   . Renal insufficiency   . Diverticulosis   . Hemorrhoids   . Type 2 diabetes mellitus   . Anxiety disorder   . Urinary retention   . Anemia   . Diabetic neuropathy   . Constipation   . Herpes zoster   . Arthritis   . Hyperlipidemia   . Myeloma 12/31/2010  . Arthralgia of multiple joints 12/31/2010  . Anemia, iron deficiency 03/12/2011  . 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 Hypotestosteronism 03/17/2012   Past Surgical History  Procedure Laterality Date  . Knee surgery    . Knee arthroscopy      left  . Portacath placement    . Cataract extraction      bi-lateral  . Transurethral resection of prostate  01/2011    Dr. at [facility] in [place]  . Colonoscopy    . Esophagogastroduodenoscopy    . Colonoscopy  07/13/2011    Procedure: COLONOSCOPY;  Surgeon: Ladene Artist, MD,FACG;  Location: [facility];  Service: Endoscopy;  Laterality: N/A;   Family History  Problem  Relation Age of Onset  . Breast cancer Sister   . Breast cancer Mother   . Diabetes Sister   . Bone cancer Father   . Colon cancer Neg Hx   . Heart disease Sister   . Heart disease Mother   . Heart disease Daughter   . Throat cancer Brother   . Stomach cancer Brother    Social History  Substance Use Topics  . Smoking status: Never Smoker   . Smokeless tobacco: Never Used     Comment: never used tobacco  . Alcohol Use: No    Review of Systems  Constitutional: Negative for appetite change and fatigue.  HENT: Negative for congestion, ear discharge and sinus pressure.   Eyes: Negative for discharge.  Respiratory: Negative for cough.   Cardiovascular: Negative for chest pain.  Gastrointestinal: Positive for abdominal pain. Negative for diarrhea.  Genitourinary: Negative for frequency and hematuria.  Musculoskeletal: Negative for back pain.  Skin: Negative for rash.  Neurological: Negative for seizures and headaches.  Psychiatric/Behavioral: Negative for hallucinations.      Allergies  Iodine  Home Medications   Prior to Admission medications   Medication Sig Start Date End Date Taking? Authorizing Provider  amLODipine (NORVASC) 10 MG tablet Take 10 mg by mouth daily.   Yes Historical Provider, MD  CARAFATE 1 GM/10ML suspension Take 1 g by mouth  4 (four) times daily.  07/28/11  Yes Historical Provider, MD  diazepam (VALIUM) 5 MG tablet Take 5 mg by mouth every 4 (four) hours as needed for anxiety.   Yes Historical Provider, MD  furosemide (LASIX) 40 MG tablet Take 40 mg by mouth.   Yes Historical Provider, MD  glipiZIDE (GLUCOTROL) 5 MG tablet Take 5 mg by mouth daily before breakfast.   Yes Historical Provider, MD  Hypromellose (NATURAL BALANCE TEARS OP) Apply 1 drop to eye every morning.    Yes Historical Provider, MD  meclizine (ANTIVERT) 50 MG tablet Take 0.5 tablets (25 mg total) by mouth 4 (four) times daily as needed for dizziness. 12/13/11  Yes Virgel Manifold, MD   methocarbamol (ROBAXIN) 500 MG tablet Take 500 mg by mouth 2 (two) times daily as needed for muscle spasms.    Yes Historical Provider, MD  ondansetron (ZOFRAN ODT) 4 MG disintegrating tablet Take 1 tablet (4 mg total) by mouth every 8 (eight) hours as needed for nausea or vomiting. 09/20/14  Yes Alvina Chou, PA-C  OxyCODONE (OXYCONTIN) 80 mg T12A 12 hr tablet Take 80 mg by mouth 3 (three) times daily.   Yes Historical Provider, MD  oxyCODONE-acetaminophen (PERCOCET) 10-325 MG per tablet Take 1 tablet by mouth every 6 (six) hours as needed for pain.    Yes Historical Provider, MD  polyethylene glycol (MIRALAX / GLYCOLAX) packet Take 17 g by mouth daily.   Yes Historical Provider, MD  Polyvinyl Alcohol-Povidone (CLEAR EYES ALL SEASONS) 5-6 MG/ML SOLN Apply 2 drops to eye 3 (three) times daily as needed (drye eyes.).    Yes Historical Provider, MD  potassium chloride SA (K-DUR,KLOR-CON) 20 MEQ tablet Take 1 tablet (20 mEq total) by mouth 2 (two) times daily. 11/07/14  Yes Volanda Napoleon, MD  prochlorperazine (COMPAZINE) 10 MG tablet Take 1 tablet (10 mg total) by mouth every 6 (six) hours as needed for nausea or vomiting. 04/18/14  Yes Volanda Napoleon, MD  Tamsulosin HCl (FLOMAX) 0.4 MG CAPS Take 0.4 mg by mouth daily.    Yes Historical Provider, MD  triamterene-hydrochlorothiazide (MAXZIDE-25) 37.5-25 MG per tablet Take 1 tablet by mouth daily.     Yes Historical Provider, MD  Vitamin D, Ergocalciferol, (DRISDOL) 50000 UNITS CAPS capsule Take 50,000 Units by mouth every 7 (seven) days.   Yes Historical Provider, MD  hydrocortisone (ANUSOL-HC) 2.5 % rectal cream Use 1 application rectally at bedtime for 7 days. Patient not taking: Reported on 11/14/2014 09/26/14   Willia Craze, NP  meloxicam (MOBIC) 15 MG tablet Take 1 tablet (15 mg total) by mouth daily. Patient not taking: Reported on 11/14/2014 02/09/14   Volanda Napoleon, MD  omeprazole (PRILOSEC) 20 MG capsule Take 1 capsule (20 mg total) by  mouth daily. Patient not taking: Reported on 11/14/2014 08/30/13   Ladene Artist, MD   BP 148/75 mmHg  Pulse 99  Temp(Src) 98 F (36.7 C) (Oral)  Resp 18  SpO2 99% Physical Exam  Constitutional: He is oriented to person, place, and time. He appears well-developed.  HENT:  Head: Normocephalic.  Eyes: Conjunctivae and EOM are normal. No scleral icterus.  Neck: Neck supple. No thyromegaly present.  Cardiovascular: Normal rate and regular rhythm.  Exam reveals no gallop and no friction rub.   No murmur heard. Pulmonary/Chest: No stridor. He has no wheezes. He has no rales. He exhibits no tenderness.  Abdominal: He exhibits no distension. There is tenderness. There is rebound and guarding.  Musculoskeletal:  Normal range of motion. He exhibits no edema.  Lymphadenopathy:    He has no cervical adenopathy.  Neurological: He is oriented to person, place, and time. He exhibits normal muscle tone. Coordination normal.  Skin: No rash noted. No erythema.  Psychiatric: He has a normal mood and affect. His behavior is normal.    ED Course  Procedures (including critical care time) Labs Review Labs Reviewed  CBC WITH DIFFERENTIAL/PLATELET - Abnormal; Notable for the following:    RBC 4.19 (*)    Hemoglobin 10.5 (*)    HCT 32.1 (*)    MCV 76.6 (*)    MCH 25.1 (*)    RDW 15.8 (*)    All other components within normal limits  I-STAT CHEM 8, ED - Abnormal; Notable for the following:    Potassium 3.2 (*)    BUN 31 (*)    Creatinine, Ser 2.20 (*)    Glucose, Bld 110 (*)    Calcium, Ion 1.11 (*)    Hemoglobin 11.9 (*)    HCT 35.0 (*)    All other components within normal limits  COMPREHENSIVE METABOLIC PANEL  PROTIME-INR    Imaging Review No results found. I have personally reviewed and evaluated these images and lab results as part of my medical decision-making.   EKG Interpretation None      MDM   Final diagnoses:  Abdominal pain in male    Abdominal pain after esophageal  stretching. Patient is to be admitted to GI    Milton Ferguson, MD 11/14/14 1715

## 2014-11-14 NOTE — Progress Notes (Signed)
Report to PACU, RN, vss, BBS= Clear.  

## 2014-11-14 NOTE — H&P (Signed)
Admission Note  Primary Care Physician:  Barbette Merino, MD Primary Gastroenterologist:  Lucio Zan  MD  HPI: [name] is a 64 y.o. male known to Dr. Fuller Plan who has history of diverticulosis. He last had colonoscopy in May 2013. Patient was seen in our office recently with complaints of solid food dysphagia and RLQ pain. He had had an empiric esophageal dilation in 2010 which was beneficial. 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 Patient was interested in repeat esophageal dilation and was scheduled for EGD with esophageal dilation with Dr. Fuller Plan which was done earlier this afternoon. Patient tolerated the procedure well and had EGD and Savary dilation to 17  millimeters. Post procedure, while in the recovery getting ready to go home he began complaining of abdominal distention and diffuse abdominal pain after sitting up. This persisted and increased and decision was made for patient to be transferred via EMS to the emergency room for further evaluation. Patient's other medical problems include multiple myeloma for which she is followed by Dr. [name], chronic kidney disease, chronic microcytic anemia, and a chronic right lower quadrant pain which is felt to be musculoskeletal in etiology. Recent noncontrasted CT scan was unrevealing. In the ER he is hemodynamically stable. His abdominal pain is persistent. Acute abdominal films are pending. Plan is for patient to be admitted. If plain films are negative will proceed with Gastrografin barium swallow to rule out leak.   Past Medical History  Diagnosis Date  . Multiple myeloma   . Diabetes mellitus without complication   . Multiple myeloma   . Hypertension   . Renal insufficiency   . Diverticulosis   . Hemorrhoids   . Type 2 diabetes mellitus   . Anxiety disorder   . Urinary retention   . Anemia   . Diabetic neuropathy   . Constipation   . Herpes zoster   . Arthritis   . Hyperlipidemia   . Myeloma 12/31/2010   . Arthralgia of multiple joints 12/31/2010  . Anemia, iron deficiency 03/12/2011  . 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 Hypotestosteronism 03/17/2012    Past Surgical History  Procedure Laterality Date  . Knee surgery    . Knee arthroscopy      left  . Portacath placement    . Cataract extraction      bi-lateral  . Transurethral resection of prostate  01/2011    Dr. at [facility] in [place]  . Colonoscopy    . Esophagogastroduodenoscopy    . Colonoscopy  07/13/2011    Procedure: COLONOSCOPY;  Surgeon: Ladene Artist, MD,FACG;  Location: [facility];  Service: Endoscopy;  Laterality: N/A;    Prior to Admission medications   Medication Sig Start Date End Date Taking? Authorizing Provider  amLODipine (NORVASC) 10 MG tablet Take 10 mg by mouth daily.   Yes Historical Provider, MD  CARAFATE 1 GM/10ML suspension Take 1 g by mouth 4 (four) times daily.  07/28/11  Yes Historical Provider, MD  diazepam (VALIUM) 5 MG tablet Take 5 mg by mouth every 4 (four) hours as needed for anxiety.   Yes Historical Provider, MD  furosemide (LASIX) 40 MG tablet Take 40 mg by mouth.   Yes Historical Provider, MD  glipiZIDE (GLUCOTROL) 5 MG tablet Take 5 mg by mouth daily before breakfast.   Yes Historical Provider, MD  Hypromellose (NATURAL BALANCE TEARS OP) Apply 1 drop to eye every morning.    Yes Historical Provider, MD  meclizine (  ANTIVERT) 50 MG tablet Take 0.5 tablets (25 mg total) by mouth 4 (four) times daily as needed for dizziness. 12/13/11  Yes Virgel Manifold, MD  methocarbamol (ROBAXIN) 500 MG tablet Take 500 mg by mouth 2 (two) times daily as needed for muscle spasms.    Yes Historical Provider, MD  ondansetron (ZOFRAN ODT) 4 MG disintegrating tablet Take 1 tablet (4 mg total) by mouth every 8 (eight) hours as needed for nausea or vomiting. 09/20/14  Yes Alvina Chou, PA-C  OxyCODONE (OXYCONTIN) 80 mg T12A 12 hr tablet Take 80 mg by mouth 3 (three) times daily.   Yes Historical  Provider, MD  oxyCODONE-acetaminophen (PERCOCET) 10-325 MG per tablet Take 1 tablet by mouth every 6 (six) hours as needed for pain.    Yes Historical Provider, MD  polyethylene glycol (MIRALAX / GLYCOLAX) packet Take 17 g by mouth daily.   Yes Historical Provider, MD  Polyvinyl Alcohol-Povidone (CLEAR EYES ALL SEASONS) 5-6 MG/ML SOLN Apply 2 drops to eye 3 (three) times daily as needed (drye eyes.).    Yes Historical Provider, MD  potassium chloride SA (K-DUR,KLOR-CON) 20 MEQ tablet Take 1 tablet (20 mEq total) by mouth 2 (two) times daily. 11/07/14  Yes Volanda Napoleon, MD  prochlorperazine (COMPAZINE) 10 MG tablet Take 1 tablet (10 mg total) by mouth every 6 (six) hours as needed for nausea or vomiting. 04/18/14  Yes Volanda Napoleon, MD  Tamsulosin HCl (FLOMAX) 0.4 MG CAPS Take 0.4 mg by mouth daily.    Yes Historical Provider, MD  triamterene-hydrochlorothiazide (MAXZIDE-25) 37.5-25 MG per tablet Take 1 tablet by mouth daily.     Yes Historical Provider, MD  Vitamin D, Ergocalciferol, (DRISDOL) 50000 UNITS CAPS capsule Take 50,000 Units by mouth every 7 (seven) days.   Yes Historical Provider, MD  hydrocortisone (ANUSOL-HC) 2.5 % rectal cream Use 1 application rectally at bedtime for 7 days. Patient not taking: Reported on 11/14/2014 09/26/14   Willia Craze, NP  meloxicam (MOBIC) 15 MG tablet Take 1 tablet (15 mg total) by mouth daily. Patient not taking: Reported on 11/14/2014 02/09/14   Volanda Napoleon, MD  omeprazole (PRILOSEC) 20 MG capsule Take 1 capsule (20 mg total) by mouth daily. Patient not taking: Reported on 11/14/2014 08/30/13   Ladene Artist, MD    Current Facility-Administered Medications  Medication Dose Route Frequency Provider Last Rate Last Dose  . HYDROmorphone (DILAUDID) injection 1 mg  1 mg Intravenous Once Milton Ferguson, MD      . ondansetron [facility]) injection 4 mg  4 mg Intravenous Once Milton Ferguson, MD      . sodium chloride 0.9 % bolus 500 mL  500 mL Intravenous Once  Milton Ferguson, MD       Current Outpatient Prescriptions  Medication Sig Dispense Refill  . amLODipine (NORVASC) 10 MG tablet Take 10 mg by mouth daily.    Marland Kitchen CARAFATE 1 GM/10ML suspension Take 1 g by mouth 4 (four) times daily.     . diazepam (VALIUM) 5 MG tablet Take 5 mg by mouth every 4 (four) hours as needed for anxiety.    . furosemide (LASIX) 40 MG tablet Take 40 mg by mouth.    Marland Kitchen glipiZIDE (GLUCOTROL) 5 MG tablet Take 5 mg by mouth daily before breakfast.    . Hypromellose (NATURAL BALANCE TEARS OP) Apply 1 drop to eye every morning.     . meclizine (ANTIVERT) 50 MG tablet Take 0.5 tablets (25 mg total) by mouth 4 (four)  times daily as needed for dizziness. 30 tablet 0  . methocarbamol (ROBAXIN) 500 MG tablet Take 500 mg by mouth 2 (two) times daily as needed for muscle spasms.     . ondansetron (ZOFRAN ODT) 4 MG disintegrating tablet Take 1 tablet (4 mg total) by mouth every 8 (eight) hours as needed for nausea or vomiting. 10 tablet 0  . OxyCODONE (OXYCONTIN) 80 mg T12A 12 hr tablet Take 80 mg by mouth 3 (three) times daily.    Marland Kitchen oxyCODONE-acetaminophen (PERCOCET) 10-325 MG per tablet Take 1 tablet by mouth every 6 (six) hours as needed for pain.     . polyethylene glycol (MIRALAX / GLYCOLAX) packet Take 17 g by mouth daily.    . Polyvinyl Alcohol-Povidone (CLEAR EYES ALL SEASONS) 5-6 MG/ML SOLN Apply 2 drops to eye 3 (three) times daily as needed (drye eyes.).     Marland Kitchen potassium chloride SA (K-DUR,KLOR-CON) 20 MEQ tablet Take 1 tablet (20 mEq total) by mouth 2 (two) times daily. 60 tablet 6  . prochlorperazine (COMPAZINE) 10 MG tablet Take 1 tablet (10 mg total) by mouth every 6 (six) hours as needed for nausea or vomiting. 60 tablet 2  . Tamsulosin HCl (FLOMAX) 0.4 MG CAPS Take 0.4 mg by mouth daily.     Marland Kitchen triamterene-hydrochlorothiazide (MAXZIDE-25) 37.5-25 MG per tablet Take 1 tablet by mouth daily.      . Vitamin D, Ergocalciferol, (DRISDOL) 50000 UNITS CAPS capsule Take 50,000 Units  by mouth every 7 (seven) days.    . hydrocortisone (ANUSOL-HC) 2.5 % rectal cream Use 1 application rectally at bedtime for 7 days. (Patient not taking: Reported on 11/14/2014) 30 g 0  . meloxicam (MOBIC) 15 MG tablet Take 1 tablet (15 mg total) by mouth daily. (Patient not taking: Reported on 11/14/2014) 30 tablet 2  . omeprazole (PRILOSEC) 20 MG capsule Take 1 capsule (20 mg total) by mouth daily. (Patient not taking: Reported on 11/14/2014) 30 capsule 11  . [DISCONTINUED] potassium chloride (KLOR-CON) 20 MEQ packet Take 20 mEq by mouth daily.      Facility-Administered Medications Ordered in Other Encounters  Medication Dose Route Frequency Provider Last Rate Last Dose  . 0.9 %  sodium chloride infusion  500 mL Intravenous Continuous Ladene Artist, MD        Allergies as of 11/14/2014 - Review Complete 11/14/2014  Allergen Reaction Noted  . Iodine Anxiety and Rash 10/02/2013    Family History  Problem Relation Age of Onset  . Breast cancer Sister   . Breast cancer Mother   . Diabetes Sister   . Bone cancer Father   . Colon cancer Neg Hx   . Heart disease Sister   . Heart disease Mother   . Heart disease Daughter   . Throat cancer Brother   . Stomach cancer Brother     Social History   Social History  . Marital Status: Divorced    Spouse Name: N/A  . Number of Children: 3  . Years of Education: N/A   Occupational History  . retired    Social History Main Topics  . Smoking status: Never Smoker   . Smokeless tobacco: Never Used     Comment: never used tobacco  . Alcohol Use: No  . Drug Use: No  . Sexual Activity: Not on file   Other Topics Concern  . Not on file   Social History Narrative   ** Merged History Encounter **        Review  of Systems:  All systems reviewed an negative except where noted in HPI.   Physical Exam: Vital signs in last 24 hours: Temp:  [98 F (36.7 C)-98.2 F (36.8 C)] 98 F (36.7 C) (09/21 1608) Pulse Rate:  [77-99] 99 (09/21  1608) Resp:  [10-19] 18 (09/21 1608) BP: (132-160)/(75-108) 148/75 mmHg (09/21 1608) SpO2:  [97 %-100 %] 99 % (09/21 1608) Weight:  [197 lb (89.359 kg)] 197 lb (89.359 kg) (09/21 1334)   General:  Pleasant, well-developed,AA male in NAD-uncomfortable Head:  Normocephalic and atraumatic. Eyes:  Sclera clear, no icterus.   Conjunctiva pink. Ears:  Normal auditory acuity. Mouth:  No deformity or lesions.  Neck:  Supple; no masses . Lungs:  Clear throughout to auscultation.   No wheezes, crackles, or rhonchi. No crepitus Heart:  Regular rate and rhythm; no murmurs. Abdomen:  Distended, BS hyperactive, diffusley tender with guarding, no masses, hepatomegaly.     Rectal:  Not done  Msk:  Symmetrical without gross deformities.. Pulses:  Normal pulses noted. Extremities:  Without edema. Neurologic:  Alert and  oriented x4;  grossly normal neurologically. Skin:  Intact without significant lesions or rashes. Cervical Nodes:  No significant cervical adenopathy. Psych:  Alert and cooperative. Normal mood and affect.  Lab Results: No results for input(s): WBC, HGB, HCT, PLT in the last 72 hours. BMET No results for input(s): NA, K, CL, CO2, GLUCOSE, BUN, CREATININE, CALCIUM in the last 72 hours. LFT No results for input(s): PROT, ALBUMIN, AST, ALT, ALKPHOS, BILITOT, BILIDIR, IBILI in the last 72 hours. PT/INR No results for input(s): LABPROT, INR in the last 72 hours. Hepatitis Panel No results for input(s): HEPBSAG, HCVAB, HEPAIGM, HEPBIGM in the last 72 hours.   Impression / Plan:  #1 64 yo male with acute abdominal pain and distention s/p EGD with esophageal dilation earlier this afternoon. Procedure done for worsening dysphagia and previously documented hypertensive LES R/O esophageal or gastric perforation R/O impending bowel obstruction, air distention, other process #2 Multiple myeloma-undergoing chemo #3 CKD #4 chronic microcytic anemia #5 AODM #6 diverticulosis  Plan:  Pt  will be admitted on observation Pain control NPO IV PPI Stat abdominal films, then a Gastrograffin swallow. Will need non-contrasted CT of abdomen, pelvis and chest if above studies not diagnostic     Amy Esterwood  11/14/2014, 4:37 PM   ,  Attending physician's note   I have taken a history, examined the patient and reviewed the chart. I agree with the Advanced Practitioner's note, impression and recommendations. Pt with MM undergoing chemo, CKD, AODM, on chronic pain medications who has worsening abdominal pain over past several months-evaluated in ED for this problem in late July. CT scan without contrast was unrevealing. Evaluated in our office in early August with worsening dysphagia, rectal bleeding, constipation and abdominal pain. Cause of abdominal pain was not clear but musculoskeletal pain was suspected as a possible cause. Colonoscopy in 06/2011 showed diverticulosis and internal hemorrhoids and his rectal bleeding was felt to be hemorrhoidal. He has a history of a hypertensive LES with dysphagia which had improved with dilation in 2010. Esoph mano in 2011 c/w hypertensive LES. Pt was interested in repeat esophageal dilation and today he underwent EGD with esophageal dilation and upon sitting up to leave recovery following the procedure he noted generalized abdominal pain and abdominal distention. His abdomen was mildly distended with generalized tenderness. Acute abdominal series in ED shows some increase in bowel gas, no obstructive pattern and no free air. Proceed with  gastrograffin esophogram now and if negative then CT of chest, abdomen and pelvis with oral contrast.    Ladene Artist, MD FACG [phone] Mon-Fri 8a-5p [phone] Mon-Fri 5p-8a, weekends, holidays or per [facility]

## 2014-11-15 ENCOUNTER — Telehealth: Payer: Self-pay | Admitting: *Deleted

## 2014-11-15 DIAGNOSIS — R1310 Dysphagia, unspecified: Secondary | ICD-10-CM | POA: Diagnosis present

## 2014-11-15 DIAGNOSIS — R109 Unspecified abdominal pain: Secondary | ICD-10-CM | POA: Insufficient documentation

## 2014-11-15 DIAGNOSIS — C9000 Multiple myeloma not having achieved remission: Secondary | ICD-10-CM | POA: Diagnosis not present

## 2014-11-15 DIAGNOSIS — R1031 Right lower quadrant pain: Secondary | ICD-10-CM | POA: Diagnosis present

## 2014-11-15 DIAGNOSIS — Z79899 Other long term (current) drug therapy: Secondary | ICD-10-CM | POA: Diagnosis not present

## 2014-11-15 DIAGNOSIS — R1084 Generalized abdominal pain: Secondary | ICD-10-CM | POA: Diagnosis present

## 2014-11-15 DIAGNOSIS — I129 Hypertensive chronic kidney disease with stage 1 through stage 4 chronic kidney disease, or unspecified chronic kidney disease: Secondary | ICD-10-CM | POA: Diagnosis present

## 2014-11-15 DIAGNOSIS — E1140 Type 2 diabetes mellitus with diabetic neuropathy, unspecified: Secondary | ICD-10-CM | POA: Diagnosis present

## 2014-11-15 DIAGNOSIS — R1030 Lower abdominal pain, unspecified: Secondary | ICD-10-CM | POA: Insufficient documentation

## 2014-11-15 DIAGNOSIS — D638 Anemia in other chronic diseases classified elsewhere: Secondary | ICD-10-CM | POA: Diagnosis present

## 2014-11-15 DIAGNOSIS — E785 Hyperlipidemia, unspecified: Secondary | ICD-10-CM | POA: Diagnosis present

## 2014-11-15 DIAGNOSIS — N189 Chronic kidney disease, unspecified: Secondary | ICD-10-CM | POA: Diagnosis present

## 2014-11-15 DIAGNOSIS — F419 Anxiety disorder, unspecified: Secondary | ICD-10-CM | POA: Diagnosis present

## 2014-11-15 DIAGNOSIS — K5790 Diverticulosis of intestine, part unspecified, without perforation or abscess without bleeding: Secondary | ICD-10-CM | POA: Diagnosis present

## 2014-11-15 DIAGNOSIS — G8929 Other chronic pain: Secondary | ICD-10-CM | POA: Diagnosis present

## 2014-11-15 DIAGNOSIS — D509 Iron deficiency anemia, unspecified: Secondary | ICD-10-CM | POA: Diagnosis present

## 2014-11-15 DIAGNOSIS — K449 Diaphragmatic hernia without obstruction or gangrene: Secondary | ICD-10-CM | POA: Diagnosis present

## 2014-11-15 DIAGNOSIS — K5900 Constipation, unspecified: Secondary | ICD-10-CM | POA: Diagnosis present

## 2014-11-15 DIAGNOSIS — Z79891 Long term (current) use of opiate analgesic: Secondary | ICD-10-CM | POA: Diagnosis not present

## 2014-11-15 LAB — CBC
HCT: 30 % — ABNORMAL LOW (ref 39.0–52.0)
Hemoglobin: 9.8 g/dL — ABNORMAL LOW (ref 13.0–17.0)
MCH: 25 pg — ABNORMAL LOW (ref 26.0–34.0)
MCHC: 32.7 g/dL (ref 30.0–36.0)
MCV: 76.5 fL — ABNORMAL LOW (ref 78.0–100.0)
PLATELETS: 243 10*3/uL (ref 150–400)
RBC: 3.92 MIL/uL — ABNORMAL LOW (ref 4.22–5.81)
RDW: 15.9 % — AB (ref 11.5–15.5)
WBC: 6.9 10*3/uL (ref 4.0–10.5)

## 2014-11-15 LAB — BASIC METABOLIC PANEL
ANION GAP: 6 (ref 5–15)
BUN: 23 mg/dL — ABNORMAL HIGH (ref 6–20)
CALCIUM: 8.3 mg/dL — AB (ref 8.9–10.3)
CO2: 28 mmol/L (ref 22–32)
Chloride: 105 mmol/L (ref 101–111)
Creatinine, Ser: 1.99 mg/dL — ABNORMAL HIGH (ref 0.61–1.24)
GFR, EST AFRICAN AMERICAN: 39 mL/min — AB (ref >60)
GFR, EST NON AFRICAN AMERICAN: 33 mL/min — AB (ref >60)
Glucose, Bld: 184 mg/dL — ABNORMAL HIGH (ref 65–99)
Potassium: 3.7 mmol/L (ref 3.5–5.1)
SODIUM: 139 mmol/L (ref 135–145)

## 2014-11-15 MED ORDER — DICYCLOMINE HCL 10 MG PO CAPS
10.0000 mg | ORAL_CAPSULE | Freq: Three times a day (TID) | ORAL | Status: DC
  Administered 2014-11-15 – 2014-11-16 (×4): 10 mg via ORAL
  Filled 2014-11-15 (×6): qty 1

## 2014-11-15 MED ORDER — LINACLOTIDE 145 MCG PO CAPS
145.0000 ug | ORAL_CAPSULE | Freq: Every day | ORAL | Status: DC
  Administered 2014-11-16: 145 ug via ORAL
  Filled 2014-11-15 (×2): qty 1

## 2014-11-15 MED ORDER — POLYETHYLENE GLYCOL 3350 17 G PO PACK
17.0000 g | PACK | Freq: Two times a day (BID) | ORAL | Status: DC
  Administered 2014-11-15: 17 g via ORAL
  Filled 2014-11-15: qty 1

## 2014-11-15 NOTE — Progress Notes (Signed)
[facility] Progress Note  Subjective:  Patient states that he feels terrible.  Still complaining of abdominal pain, which he admits has been going on for a while but worse than normal.  Points to pain in mid-abdomen under umbilicus.  Some nausea but no vomiting.  Willing to try clear liquids but does not feel like eating much.  Objective:  Vital signs in last 24 hours: Temp:  [98 F (36.7 C)-98.3 F (36.8 C)] 98.1 F (36.7 C) (09/22 0600) Pulse Rate:  [73-99] 76 (09/22 0600) Resp:  [10-19] 18 (09/22 0600) BP: (121-160)/(65-108) 121/67 mmHg (09/22 0600) SpO2:  [97 %-100 %] 100 % (09/22 0600) Weight:  [196 lb 13.9 oz (89.3 kg)-197 lb (89.359 kg)] 196 lb 13.9 oz (89.3 kg) (09/21 2000) Last BM Date: 11/12/14 General:  Alert, Well-developed, in NAD Heart:  Regular rate and rhythm; no murmurs Pulm:  CTAB.  No W/R/R. Abdomen:  Soft, non-distended. BS present.  Diffuse tender, which seems greatest in the upper abdomen.    Extremities:  Without edema. Neurologic:  Alert and  oriented x4;  grossly normal neurologically. Psych:  Alert and cooperative. Normal mood and affect.  Intake/Output from previous day: 09/21 0701 - 09/22 0700 In: 815 [P.O.:240; I.V.:575] Out: -   Lab Results:  Recent Labs  11/14/14 1647 11/14/14 1652 11/15/14 0422  WBC 8.2  --  6.9  HGB 10.5* 11.9* 9.8*  HCT 32.1* 35.0* 30.0*  PLT 273  --  243   BMET  Recent Labs  11/14/14 1647 11/14/14 1652 11/15/14 0422  NA 139 141 139  K 3.2* 3.2* 3.7  CL 103 103 105  CO2 27  --  28  GLUCOSE 113* 110* 184*  BUN 29* 31* 23*  CREATININE 2.20* 2.20* 1.99*  CALCIUM 8.6*  --  8.3*   LFT  Recent Labs  11/14/14 1647  PROT 8.2*  ALBUMIN 4.1  AST 21  ALT 23  ALKPHOS 53  BILITOT 0.5   PT/INR  Recent Labs  11/14/14 2235  LABPROT 14.4  INR 1.10   Ct Abdomen Pelvis Wo Contrast  11/14/2014   CLINICAL DATA:  Pt had esophageal dilation at [facility] today. Upon discharging patient,  patient sat up in bed and had sudden onset of abdominal pain and pressure. Pt states pain has been constant but changing in severity. Pt denies other complaints.  EXAM: CT ABDOMEN AND PELVIS WITHOUT CONTRAST  TECHNIQUE: Multidetector CT imaging of the abdomen and pelvis was performed following the standard protocol without IV contrast.  COMPARISON:  09/20/2014  FINDINGS: Lower chest: There are 2-4 mm nodules within the right lower lobe, stable in appearance. Right lower lobe atelectasis. The heart is mildly enlarged. Normal appearance of the lower esophagus.  Upper abdomen: No focal abnormality identified within the liver, spleen, pancreas, or adrenal glands. The gallbladder is present. Within the midpole region of the left kidney there is a 1.2 cm lesion. Within the lower pole of the right kidney there is a 2.4 cm low-attenuation lesion. These lesions are are favored to be cyst based on their density. However these lesions cannot be fully characterized on this noncontrast exam.  Gastrointestinal tract: The stomach and small bowel loops are normal in appearance. The appendix is well seen and has a normal appearance. Normal appearance of the colon.  Pelvis: Urinary bladder is distended. Prostate gland and seminal vesicles are normal in appearance.  Retroperitoneum: No retroperitoneal or mesenteric adenopathy. No evidence for aortic aneurysm.  Abdominal wall: Unremarkable.  Osseous structures: There is moderate spondylosis of the lower thoracic and lumbar spine. No suspicious lytic or blastic lesions are identified.  IMPRESSION: 1. Normal appearance of the lower esophagus, stomach, and bowel. No evidence for perforation. No mediastinal gas or free intraperitoneal air. 2. Stable appearance of small nodules at the right lung base. 3. Probable bilateral renal cysts. 4. Normal appendix. 5. Degenerative disc disease.   Electronically Signed   By: Nolon Nations M.D.   On: 11/14/2014 22:52   Dg Abd Acute  W/chest  11/14/2014   CLINICAL DATA:  Acute lower abdominal pain.  EXAM: DG ABDOMEN ACUTE W/ 1V CHEST  COMPARISON:  CT scan of September 20, 2014.  FINDINGS: There is no evidence of dilated bowel loops or free intraperitoneal air. No radiopaque calculi or other significant radiographic abnormality is seen. Heart size and mediastinal contours are within normal limits. Both lungs are clear. Right-sided Port-A-Cath is noted with distal tip in expected position of the SVC.  IMPRESSION: No evidence of bowel obstruction or ileus. No acute cardiopulmonary disease.   Electronically Signed   By: Marijo Conception, M.D.   On: 11/14/2014 17:18   Dg Esophagus W/water Sol Cm  11/14/2014   CLINICAL DATA:  64 year old male status post esophageal dilatation complaining of severe abdominal pain and pressure immediately after the dilatation procedure today. Evaluate for potential perforation.  EXAM: ESOPHOGRAM/BARIUM SWALLOW  TECHNIQUE: Single contrast examination was performed using water soluble contrast (Omnipaque 300).  FLUOROSCOPY TIME:  If the device does not provide the exposure index:  Fluoroscopy Time:  2 minutes and 5 seconds  Number of Acquired Images:  32 series  COMPARISON:  No priors.  FINDINGS: Both prone and supine images of the esophagus were obtained after ingestion of water soluble oral contrast material. No perforation was identified. No definite esophageal mass, stricture or esophageal ring. Small hiatal hernia.  IMPRESSION: 1. No evidence of esophageal perforation. 2. No evidence of residual esophageal stricture. 3. Small hiatal hernia.   Electronically Signed   By: Vinnie Langton M.D.   On: 11/14/2014 18:10    Assessment / Plan: #1 64 yo male with acute worsening of chronic abdominal pain s/p EGD with esophageal dilation yesterday afternoon.  Extensive imaging with no evidence of perforation or other acute processes.  #2 Multiple myeloma-undergoing chemo #3 CKD #4 Chronic microcytic anemia #5 AODM #6  Diverticulosis  -Will give clear liquids today and will start Bentyl 10 mg TID.  Will also put him on Miralax BID for his chronic constipation issue.    Roschelle Calandra D.  11/15/2014, 8:57 AM  Pager number [phone]

## 2014-11-15 NOTE — Telephone Encounter (Signed)
This patient wasn't called d/t his being in the hospital

## 2014-11-16 DIAGNOSIS — R109 Unspecified abdominal pain: Secondary | ICD-10-CM | POA: Insufficient documentation

## 2014-11-16 MED ORDER — HEPARIN SOD (PORK) LOCK FLUSH 100 UNIT/ML IV SOLN
500.0000 [IU] | INTRAVENOUS | Status: DC | PRN
  Administered 2014-11-16: 500 [IU]
  Filled 2014-11-16: qty 5

## 2014-11-16 MED ORDER — HEPARIN SOD (PORK) LOCK FLUSH 100 UNIT/ML IV SOLN
500.0000 [IU] | INTRAVENOUS | Status: DC
  Filled 2014-11-16: qty 5

## 2014-11-16 MED ORDER — DICYCLOMINE HCL 10 MG PO CAPS: 10.0000 mg | ORAL_CAPSULE | Freq: Three times a day (TID) | ORAL | Status: DC

## 2014-11-16 MED ORDER — LINACLOTIDE 145 MCG PO CAPS: 145.0000 ug | ORAL_CAPSULE | Freq: Every day | ORAL | Status: DC

## 2014-11-16 NOTE — Discharge Summary (Signed)
[facility] Discharge Summary  Name: [name] MRN: [id number] DOB: 11/17/1950 64 y.o. PCP:  Barbette Merino, MD  Date of Admission: 11/14/2014  4:02 PM Date of Discharge: 11/16/2014 Attending Physician: Jerene Bears, MD  Discharge Diagnosis: Active Problems:   Acute abdominal pain   Right sided abdominal pain   Lower abdominal pain   Multiple myeloma  Consultations: Treatment Team:  Ladene Artist, MD  Procedures Performed:  Ct Abdomen Pelvis Wo Contrast  11/14/2014   CLINICAL DATA:  Pt had esophageal dilation at [facility] today. Upon discharging patient, patient sat up in bed and had sudden onset of abdominal pain and pressure. Pt states pain has been constant but changing in severity. Pt denies other complaints.  EXAM: CT ABDOMEN AND PELVIS WITHOUT CONTRAST  TECHNIQUE: Multidetector CT imaging of the abdomen and pelvis was performed following the standard protocol without IV contrast.  COMPARISON:  09/20/2014  FINDINGS: Lower chest: There are 2-4 mm nodules within the right lower lobe, stable in appearance. Right lower lobe atelectasis. The heart is mildly enlarged. Normal appearance of the lower esophagus.  Upper abdomen: No focal abnormality identified within the liver, spleen, pancreas, or adrenal glands. The gallbladder is present. Within the midpole region of the left kidney there is a 1.2 cm lesion. Within the lower pole of the right kidney there is a 2.4 cm low-attenuation lesion. These lesions are are favored to be cyst based on their density. However these lesions cannot be fully characterized on this noncontrast exam.  Gastrointestinal tract: The stomach and small bowel loops are normal in appearance. The appendix is well seen and has a normal appearance. Normal appearance of the colon.  Pelvis: Urinary bladder is distended. Prostate gland and seminal vesicles are normal in appearance.  Retroperitoneum: No retroperitoneal or mesenteric adenopathy. No evidence for  aortic aneurysm.  Abdominal wall: Unremarkable.  Osseous structures: There is moderate spondylosis of the lower thoracic and lumbar spine. No suspicious lytic or blastic lesions are identified.  IMPRESSION: 1. Normal appearance of the lower esophagus, stomach, and bowel. No evidence for perforation. No mediastinal gas or free intraperitoneal air. 2. Stable appearance of small nodules at the right lung base. 3. Probable bilateral renal cysts. 4. Normal appendix. 5. Degenerative disc disease.   Electronically Signed   By: Nolon Nations M.D.   On: 11/14/2014 22:52   Dg Abd Acute W/chest  11/14/2014   CLINICAL DATA:  Acute lower abdominal pain.  EXAM: DG ABDOMEN ACUTE W/ 1V CHEST  COMPARISON:  CT scan of September 20, 2014.  FINDINGS: There is no evidence of dilated bowel loops or free intraperitoneal air. No radiopaque calculi or other significant radiographic abnormality is seen. Heart size and mediastinal contours are within normal limits. Both lungs are clear. Right-sided Port-A-Cath is noted with distal tip in expected position of the SVC.  IMPRESSION: No evidence of bowel obstruction or ileus. No acute cardiopulmonary disease.   Electronically Signed   By: Marijo Conception, M.D.   On: 11/14/2014 17:18   Dg Esophagus W/water Sol Cm  11/14/2014   CLINICAL DATA:  64 year old male status post esophageal dilatation complaining of severe abdominal pain and pressure immediately after the dilatation procedure today. Evaluate for potential perforation.  EXAM: ESOPHOGRAM/BARIUM SWALLOW  TECHNIQUE: Single contrast examination was performed using water soluble contrast (Omnipaque 300).  FLUOROSCOPY TIME:  If the device does not provide the exposure index:  Fluoroscopy Time:  2 minutes and 5 seconds  Number of Acquired Images:  32 series  COMPARISON:  No priors.  FINDINGS: Both prone and supine images of the esophagus were obtained after ingestion of water soluble oral contrast material. No perforation was identified. No  definite esophageal mass, stricture or esophageal ring. Small hiatal hernia.  IMPRESSION: 1. No evidence of esophageal perforation. 2. No evidence of residual esophageal stricture. 3. Small hiatal hernia.   Electronically Signed   By: Vinnie Langton M.D.   On: 11/14/2014 18:10    GI Procedures:  None  History/Physical Exam:  See Admission H&P  Admission HPI:  Patient was admitted post-EGD with dilation due to concern of perforation when he complained of acute abdominal pain after procedure on 9/21.  UGI with gastrograffin, x-rays, and CT scan did not show any evidence of perforation or other acute issues.  Patient has chronic abdominal pain and constipation.  On 9/22 he still was not feeling well.  Bentyl was started at 10 mg TID and later in the day we ordered Linzess 145 mcg daily (received first dose with breakfast on 9/23, the day of discharge).  Diet was advanced as tolerated.  He drank well and ate some.  On 9/23 he reported feeling much better with abdominal pain about at baseline.  Patient was asking about discharge home.  Plan to continue Bentyl 10 mg TID and Linzess 145 mcg daily.  Hopefully if he begins moving his bowels better then chronic abdominal pain will improve.  Discharge Vitals:  BP 147/77 mmHg  Pulse 79  Temp(Src) 98.1 F (36.7 C) (Oral)  Resp 18  Ht _0  (1.803 m)  Wt 196 lb 13.9 oz (89.3 kg)  BMI 27.47 kg/m2  SpO2 98%  Discharge Labs: No results found for this or any previous visit (from the past 24 hour(s)).  Disposition and follow-up:   [name] was discharged from [facility] in stable condition.    Follow-up Appointments:  -Dr. Fuller Plan on 01/01/2015 at 2:45 pm.      Discharge Instructions    Activity as tolerated - No restrictions    Complete by:  As directed      Resume previous diet    Complete by:  As directed            Discharge Medications:   Medication List    STOP taking these medications        hydrocortisone 2.5 %  rectal cream  Commonly known as:  ANUSOL-HC     meloxicam 15 MG tablet  Commonly known as:  MOBIC     polyethylene glycol packet  Commonly known as:  MIRALAX / GLYCOLAX      TAKE these medications        amLODipine 10 MG tablet  Commonly known as:  NORVASC  Take 10 mg by mouth daily.     CARAFATE 1 GM/10ML suspension  Generic drug:  sucralfate  Take 1 g by mouth 4 (four) times daily.     CLEAR EYES ALL SEASONS 5-6 MG/ML Soln  Generic drug:  Polyvinyl Alcohol-Povidone  Apply 2 drops to eye 3 (three) times daily as needed (drye eyes.).     diazepam 5 MG tablet  Commonly known as:  VALIUM  Take 5 mg by mouth every 4 (four) hours as needed for anxiety.     dicyclomine 10 MG capsule  Commonly known as:  BENTYL  Take 1 capsule (10 mg total) by mouth 3 (three) times daily.     glipiZIDE 5 MG tablet  Commonly known as:  GLUCOTROL  Take 5 mg by mouth daily before breakfast.     LASIX 40 MG tablet  Generic drug:  furosemide  Take 40 mg by mouth.     Linaclotide 145 MCG Caps capsule  Commonly known as:  LINZESS  Take 1 capsule (145 mcg total) by mouth daily before breakfast.     meclizine 50 MG tablet  Commonly known as:  ANTIVERT  Take 0.5 tablets (25 mg total) by mouth 4 (four) times daily as needed for dizziness.     methocarbamol 500 MG tablet  Commonly known as:  ROBAXIN  Take 500 mg by mouth 2 (two) times daily as needed for muscle spasms.     NATURAL BALANCE TEARS OP  Apply 1 drop to eye every morning.     omeprazole 20 MG capsule  Commonly known as:  PRILOSEC  Take 1 capsule (20 mg total) by mouth daily.     ondansetron 4 MG disintegrating tablet  Commonly known as:  ZOFRAN ODT  Take 1 tablet (4 mg total) by mouth every 8 (eight) hours as needed for nausea or vomiting.     oxyCODONE-acetaminophen 10-325 MG per tablet  Commonly known as:  PERCOCET  Take 1 tablet by mouth every 6 (six) hours as needed for pain.     OXYCONTIN 80 mg T12a 12 hr tablet    Generic drug:  OxyCODONE  Take 80 mg by mouth 3 (three) times daily.     potassium chloride SA 20 MEQ tablet  Commonly known as:  K-DUR,KLOR-CON  Take 1 tablet (20 mEq total) by mouth 2 (two) times daily.     prochlorperazine 10 MG tablet  Commonly known as:  COMPAZINE  Take 1 tablet (10 mg total) by mouth every 6 (six) hours as needed for nausea or vomiting.     tamsulosin 0.4 MG Caps capsule  Commonly known as:  FLOMAX  Take 0.4 mg by mouth daily.     triamterene-hydrochlorothiazide 37.5-25 MG per tablet  Commonly known as:  MAXZIDE-25  Take 1 tablet by mouth daily.     Vitamin D (Ergocalciferol) 50000 UNITS Caps capsule  Commonly known as:  DRISDOL  Take 50,000 Units by mouth every 7 (seven) days.        SignedMyrtice Lauth, JESSICA D. 11/16/2014, 9:33 AM

## 2014-11-16 NOTE — Progress Notes (Signed)
[facility] Progress Note  Subjective:  Says that he feels better.  Abdominal pain about at baseline.  No BM yet, but just got first dose of Linzess 145 mcg this AM.  Ate small amount this morning and has been drinking liquids.  Says "I have never been a big eater.  I eat small amounts at a time".  Would like to go home today.  Objective:  Vital signs in last 24 hours: Temp:  [98.1 F (36.7 C)-98.4 F (36.9 C)] 98.1 F (36.7 C) (09/23 0626) Pulse Rate:  [68-79] 79 (09/23 0626) Resp:  [18-20] 18 (09/23 0626) BP: (120-147)/(67-77) 147/77 mmHg (09/23 0626) SpO2:  [98 %] 98 % (09/23 0626) Last BM Date: 11/12/14 General:  Alert, Well-developed, in NAD Heart:  Regular rate and rhythm; no murmurs Pulm:  CTAB.  No W/R/R. Abdomen:  Soft, non-distended. Normal bowel sounds and somewhat hyperactive.  Diffuse TTP, but exam overall benign. Extremities:  Without edema. Neurologic:  Alert and  oriented x 4;  grossly normal neurologically. Psych:  Alert and cooperative. Normal mood and affect.  Intake/Output from previous day: 09/22 0701 - 09/23 0700 In: 2900 [P.O.:900; I.V.:2000] Out: -   Lab Results:  Recent Labs  11/14/14 1647 11/14/14 1652 11/15/14 0422  WBC 8.2  --  6.9  HGB 10.5* 11.9* 9.8*  HCT 32.1* 35.0* 30.0*  PLT 273  --  243   BMET  Recent Labs  11/14/14 1647 11/14/14 1652 11/15/14 0422  NA 139 141 139  K 3.2* 3.2* 3.7  CL 103 103 105  CO2 27  --  28  GLUCOSE 113* 110* 184*  BUN 29* 31* 23*  CREATININE 2.20* 2.20* 1.99*  CALCIUM 8.6*  --  8.3*   LFT  Recent Labs  11/14/14 1647  PROT 8.2*  ALBUMIN 4.1  AST 21  ALT 23  ALKPHOS 53  BILITOT 0.5   PT/INR  Recent Labs  11/14/14 2235  LABPROT 14.4  INR 1.10   Ct Abdomen Pelvis Wo Contrast  11/14/2014   CLINICAL DATA:  Pt had esophageal dilation at [facility] today. Upon discharging patient, patient sat up in bed and had sudden onset of abdominal pain and pressure. Pt states pain has  been constant but changing in severity. Pt denies other complaints.  EXAM: CT ABDOMEN AND PELVIS WITHOUT CONTRAST  TECHNIQUE: Multidetector CT imaging of the abdomen and pelvis was performed following the standard protocol without IV contrast.  COMPARISON:  09/20/2014  FINDINGS: Lower chest: There are 2-4 mm nodules within the right lower lobe, stable in appearance. Right lower lobe atelectasis. The heart is mildly enlarged. Normal appearance of the lower esophagus.  Upper abdomen: No focal abnormality identified within the liver, spleen, pancreas, or adrenal glands. The gallbladder is present. Within the midpole region of the left kidney there is a 1.2 cm lesion. Within the lower pole of the right kidney there is a 2.4 cm low-attenuation lesion. These lesions are are favored to be cyst based on their density. However these lesions cannot be fully characterized on this noncontrast exam.  Gastrointestinal tract: The stomach and small bowel loops are normal in appearance. The appendix is well seen and has a normal appearance. Normal appearance of the colon.  Pelvis: Urinary bladder is distended. Prostate gland and seminal vesicles are normal in appearance.  Retroperitoneum: No retroperitoneal or mesenteric adenopathy. No evidence for aortic aneurysm.  Abdominal wall: Unremarkable.  Osseous structures: There is moderate spondylosis of the lower thoracic and lumbar  spine. No suspicious lytic or blastic lesions are identified.  IMPRESSION: 1. Normal appearance of the lower esophagus, stomach, and bowel. No evidence for perforation. No mediastinal gas or free intraperitoneal air. 2. Stable appearance of small nodules at the right lung base. 3. Probable bilateral renal cysts. 4. Normal appendix. 5. Degenerative disc disease.   Electronically Signed   By: Nolon Nations M.D.   On: 11/14/2014 22:52   Dg Abd Acute W/chest  11/14/2014   CLINICAL DATA:  Acute lower abdominal pain.  EXAM: DG ABDOMEN ACUTE W/ 1V CHEST   COMPARISON:  CT scan of September 20, 2014.  FINDINGS: There is no evidence of dilated bowel loops or free intraperitoneal air. No radiopaque calculi or other significant radiographic abnormality is seen. Heart size and mediastinal contours are within normal limits. Both lungs are clear. Right-sided Port-A-Cath is noted with distal tip in expected position of the SVC.  IMPRESSION: No evidence of bowel obstruction or ileus. No acute cardiopulmonary disease.   Electronically Signed   By: Marijo Conception, M.D.   On: 11/14/2014 17:18   Dg Esophagus W/water Sol Cm  11/14/2014   CLINICAL DATA:  64 year old male status post esophageal dilatation complaining of severe abdominal pain and pressure immediately after the dilatation procedure today. Evaluate for potential perforation.  EXAM: ESOPHOGRAM/BARIUM SWALLOW  TECHNIQUE: Single contrast examination was performed using water soluble contrast (Omnipaque 300).  FLUOROSCOPY TIME:  If the device does not provide the exposure index:  Fluoroscopy Time:  2 minutes and 5 seconds  Number of Acquired Images:  32 series  COMPARISON:  No priors.  FINDINGS: Both prone and supine images of the esophagus were obtained after ingestion of water soluble oral contrast material. No perforation was identified. No definite esophageal mass, stricture or esophageal ring. Small hiatal hernia.  IMPRESSION: 1. No evidence of esophageal perforation. 2. No evidence of residual esophageal stricture. 3. Small hiatal hernia.   Electronically Signed   By: Vinnie Langton M.D.   On: 11/14/2014 18:10    Assessment / Plan: #1 64 yo male with acute worsening of chronic abdominal pain s/p EGD with esophageal dilation 9/21. Extensive imaging with no evidence of perforation or other acute processes.  Bentyl 10 mg TID started 9/22. #2 Multiple myeloma-undergoing chemo #3 CKD #4 Chronic microcytic anemia #5 AODM #6 Diverticulosis #7 Chronic constipation:  Linzess 145 mcg started today, 9/23.  ? If this  is causing some of his chronic abdominal pain.   -Patient for discharge home today.   LOS: 1 day   ZEHR, JESSICA D.  11/16/2014, 8:50 AM  Pager number [phone]

## 2014-11-21 ENCOUNTER — Ambulatory Visit (HOSPITAL_BASED_OUTPATIENT_CLINIC_OR_DEPARTMENT_OTHER): Payer: Medicare Other

## 2014-11-21 VITALS — BP 147/63 | HR 84 | Temp 98.4°F | Resp 18

## 2014-11-21 DIAGNOSIS — E349 Endocrine disorder, unspecified: Secondary | ICD-10-CM

## 2014-11-21 DIAGNOSIS — E291 Testicular hypofunction: Secondary | ICD-10-CM

## 2014-11-21 MED ORDER — TESTOSTERONE CYPIONATE 200 MG/ML IM SOLN
300.0000 mg | INTRAMUSCULAR | Status: DC
  Administered 2014-11-21: 300 mg via INTRAMUSCULAR

## 2014-11-21 MED ORDER — TESTOSTERONE CYPIONATE 200 MG/ML IM SOLN
INTRAMUSCULAR | Status: AC
  Filled 2014-11-21: qty 2

## 2014-11-21 NOTE — Patient Instructions (Signed)

## 2014-12-05 ENCOUNTER — Ambulatory Visit (HOSPITAL_BASED_OUTPATIENT_CLINIC_OR_DEPARTMENT_OTHER): Payer: Medicare Other

## 2014-12-05 DIAGNOSIS — E291 Testicular hypofunction: Secondary | ICD-10-CM | POA: Diagnosis not present

## 2014-12-05 DIAGNOSIS — E349 Endocrine disorder, unspecified: Secondary | ICD-10-CM

## 2014-12-05 MED ORDER — TESTOSTERONE CYPIONATE 200 MG/ML IM SOLN
INTRAMUSCULAR | Status: AC
Start: 2014-12-05 — End: 2014-12-05
  Filled 2014-12-05: qty 2

## 2014-12-05 MED ORDER — TESTOSTERONE CYPIONATE 200 MG/ML IM SOLN
300.0000 mg | INTRAMUSCULAR | Status: DC
  Administered 2014-12-05: 300 mg via INTRAMUSCULAR

## 2014-12-05 NOTE — Patient Instructions (Signed)

## 2014-12-14 ENCOUNTER — Telehealth: Payer: Self-pay | Admitting: Hematology & Oncology

## 2014-12-14 ENCOUNTER — Other Ambulatory Visit: Payer: Self-pay | Admitting: Hematology & Oncology

## 2014-12-14 NOTE — Telephone Encounter (Signed)
Per Lexine Baton to change patient apt time for 8am to 9:30 am.  i called patient and notified him of apt time change for his 12/19/14 apt.  Patient is aware

## 2014-12-19 ENCOUNTER — Ambulatory Visit (HOSPITAL_BASED_OUTPATIENT_CLINIC_OR_DEPARTMENT_OTHER): Payer: Medicare Other

## 2014-12-19 DIAGNOSIS — E291 Testicular hypofunction: Secondary | ICD-10-CM

## 2014-12-19 DIAGNOSIS — E349 Endocrine disorder, unspecified: Secondary | ICD-10-CM

## 2014-12-19 MED ORDER — TESTOSTERONE CYPIONATE 200 MG/ML IM SOLN
300.0000 mg | INTRAMUSCULAR | Status: DC
  Administered 2014-12-19: 300 mg via INTRAMUSCULAR

## 2014-12-19 MED ORDER — TESTOSTERONE CYPIONATE 200 MG/ML IM SOLN
INTRAMUSCULAR | Status: AC
  Filled 2014-12-19: qty 2

## 2014-12-19 NOTE — Patient Instructions (Signed)
Testosterone Testosterone is a hormone made by the male's testicles and by the adrenal glands, which are a pair of glands on top of the kidneys. Starting at puberty, testosterone stimulates the development of secondary sex characteristics. This includes a deeper voice, growth of muscles and body hair, and penis enlargement.  Females also produce testosterone in both the adrenal glands and ovaries. A male's body converts testosterone into estradiol, the main male sex hormone. An abnormal level of testosterone can cause health issues in both males and females. You may have this test if your health care provider suspects that an abnormal testosterone level is causing or contributing to other health problems. In males, symptoms of an abnormal testosterone level include:  Infertility.  Erectile dysfunction.  Delayed puberty or premature puberty. In females, symptoms of an abnormally high testosterone level include:  Infertility.  Polycystic ovarian syndrome (PCOS).  Developing masculine features (virilization). This test requires a blood sample taken from a vein in your arm or hand. The sample for this test is usually collected in the morning. The amount of testosterone in your blood is highest at that time. RESULTS It is your responsibility to obtain your test results. Ask the lab or department performing the test when and how you will get your results. Contact your health care provider to discuss any questions you have about your results.  The result of a blood test for testosterone will be given as a range of values. A testosterone level that is outside the normal range may indicate a health problem. Testosterone is measured in nanograms per deciliter (ng/dL). Range of Normal Values Ranges for normal values may vary among different labs and hospitals. You should always check with your health care provider after having lab work or other tests done to discuss whether your values are considered  within normal limits. Normal levels of total testosterone are as follows:  Male:  7 months to 65 years old: less than 30 ng/dL.  10-13 years old: less than 300 ng/dL.  14-15 years old: 170-540 ng/dL.  16-19 years old: 250-910 ng/dL.  65 years old and over: 280-1,080 ng/dL.  Male:  7 months to 65 years old: less than 30 ng/dL.  10-13 years old: less than 40 ng/dL.  14-15 years old: less than 60 ng/dL.  16-19 years old: less than 70 ng/dL.  65 years old and over: less than 70 ng/dL. Meaning of Results Outside Normal Value Ranges A testosterone level that is too low or too high can indicate a number of health problems. In males:  A high testosterone level can occur if you:  Have certain types of tumors.  Have an overactive thyroid gland (hyperthyroidism).  Use anabolic steroids.  Are starting puberty early (precocious puberty).  Have an inherited disorder that affects the adrenal glands (congenital adrenal hyperplasia).  A low testosterone level can occur if you:  Have certain genetic diseases.  Have had certain viral infections, such as mumps.  Have pituitary disease.  Have had an injury to the testicles.  Are an alcoholic. In females:  A high testosterone level can occur if you have:  Certain types of tumors.  An inherited disorder that affects certain cells in the adrenal glands (congenital adrenocortical hyperplasia).  PCOS.  A low testosterone level does not cause health problems. Discuss the results of your testosterone test with your health care provider. Your health care provider will use the results of this test and other tests to make a diagnosis.   This information   is not intended to replace advice given to you by your health care provider. Make sure you discuss any questions you have with your health care provider.   Document Released: 02/27/2004 Document Revised: 03/02/2014 Document Reviewed: 06/07/2013 Elsevier Interactive Patient  Education 2016 Elsevier Inc.  

## 2015-01-01 ENCOUNTER — Ambulatory Visit (INDEPENDENT_AMBULATORY_CARE_PROVIDER_SITE_OTHER): Payer: Medicare Other | Admitting: Gastroenterology

## 2015-01-01 ENCOUNTER — Encounter: Payer: Self-pay | Admitting: Gastroenterology

## 2015-01-01 VITALS — BP 110/60 | HR 60 | Ht 71.0 in | Wt 197.4 lb

## 2015-01-01 DIAGNOSIS — R1310 Dysphagia, unspecified: Secondary | ICD-10-CM

## 2015-01-01 DIAGNOSIS — K5903 Drug induced constipation: Secondary | ICD-10-CM | POA: Diagnosis not present

## 2015-01-01 DIAGNOSIS — G8929 Other chronic pain: Secondary | ICD-10-CM

## 2015-01-01 DIAGNOSIS — R1031 Right lower quadrant pain: Secondary | ICD-10-CM | POA: Diagnosis not present

## 2015-01-01 NOTE — Patient Instructions (Signed)
Thank you for choosing me and [facility].  Malcolm T. Stark, Jr., MD., FACG  

## 2015-01-01 NOTE — Progress Notes (Signed)
    History of Present Illness: This is a 64 year-old male with MM, CKD, chronic pain who is returning for follow-up of abdominal pain, constipation and dysphagia. EGD with dilation performed in September. Has a history of hypertensive LES. Dysphagia has improved. Patient was hospitalized following EGD for worsening of chronic abdominal pain. No procedure related complication was uncovered. Abd/plevic CT was negative. He was started Linzess for management. His constipation is under better control however he still notes straining for bowel movements and looser stools. Continues to have right lower quadrant and sometimes generalized abdominal pain. RLQ pain worsens with movement and direct pressure. Pain not changed with meals or bowel movements. Colonoscopy in 06/2011 showed mild diverticulosis and hemorrhoids.  Current Medications, Allergies, Past Medical History, Past Surgical History, Family History and Social History were reviewed in [employer] record.  Physical Exam: General: Well developed, well nourished, chronically ill appearing, no acute distress Head: Normocephalic and atraumatic Eyes:  sclerae anicteric, EOMI Ears: Normal auditory acuity Mouth: No deformity or lesions Lungs: Clear throughout to auscultation Heart: Regular rate and rhythm; no murmurs, rubs or bruits Abdomen: Soft, mild RLQ tenderness to palpation and non distended. No masses, hepatosplenomegaly or hernias noted. Normal Bowel sounds Musculoskeletal: Symmetrical with no gross deformities  Pulses:  Normal pulses noted Extremities: No clubbing, cyanosis, edema or deformities noted Neurological: Alert oriented x 4, grossly nonfocal Psychological:  Alert and cooperative. Normal mood and affect  Assessment and Recommendations:  1. Chronic RLQ abdominal pain-likely musculoskeletal etiology. No additional gastrointestinal evaluation needed at this time. Defer further management of pain to his PCP.  2.  Chronic constipation. Continue Linzess 145 g daily.  3. Dysphagia with hypertensive LES, improved.

## 2015-01-07 ENCOUNTER — Ambulatory Visit (HOSPITAL_BASED_OUTPATIENT_CLINIC_OR_DEPARTMENT_OTHER): Payer: Medicare Other | Admitting: Hematology & Oncology

## 2015-01-07 ENCOUNTER — Encounter: Payer: Self-pay | Admitting: Hematology & Oncology

## 2015-01-07 ENCOUNTER — Ambulatory Visit (HOSPITAL_BASED_OUTPATIENT_CLINIC_OR_DEPARTMENT_OTHER): Payer: Medicare Other

## 2015-01-07 VITALS — BP 180/76 | HR 97 | Temp 97.7°F | Resp 16 | Ht 71.0 in | Wt 199.0 lb

## 2015-01-07 DIAGNOSIS — N289 Disorder of kidney and ureter, unspecified: Secondary | ICD-10-CM

## 2015-01-07 DIAGNOSIS — C9000 Multiple myeloma not having achieved remission: Secondary | ICD-10-CM

## 2015-01-07 DIAGNOSIS — E291 Testicular hypofunction: Secondary | ICD-10-CM | POA: Diagnosis not present

## 2015-01-07 DIAGNOSIS — D649 Anemia, unspecified: Secondary | ICD-10-CM | POA: Diagnosis not present

## 2015-01-07 DIAGNOSIS — D509 Iron deficiency anemia, unspecified: Secondary | ICD-10-CM | POA: Diagnosis not present

## 2015-01-07 DIAGNOSIS — N189 Chronic kidney disease, unspecified: Secondary | ICD-10-CM

## 2015-01-07 DIAGNOSIS — D631 Anemia in chronic kidney disease: Secondary | ICD-10-CM

## 2015-01-07 DIAGNOSIS — E349 Endocrine disorder, unspecified: Secondary | ICD-10-CM

## 2015-01-07 LAB — IRON AND TIBC CHCC
%SAT: 27 % (ref 20–55)
IRON: 63 ug/dL (ref 42–163)
TIBC: 234 ug/dL (ref 202–409)
UIBC: 171 ug/dL (ref 117–376)

## 2015-01-07 LAB — CBC WITH DIFFERENTIAL (CANCER CENTER ONLY)
BASO#: 0.1 10*3/uL (ref 0.0–0.2)
BASO%: 0.7 % (ref 0.0–2.0)
EOS%: 2.7 % (ref 0.0–7.0)
Eosinophils Absolute: 0.2 10*3/uL (ref 0.0–0.5)
HEMATOCRIT: 35.3 % — AB (ref 38.7–49.9)
HGB: 11.8 g/dL — ABNORMAL LOW (ref 13.0–17.1)
LYMPH#: 1.9 10*3/uL (ref 0.9–3.3)
LYMPH%: 25.5 % (ref 14.0–48.0)
MCH: 25.1 pg — ABNORMAL LOW (ref 28.0–33.4)
MCHC: 33.4 g/dL (ref 32.0–35.9)
MCV: 75 fL — ABNORMAL LOW (ref 82–98)
MONO#: 0.6 10*3/uL (ref 0.1–0.9)
MONO%: 8.3 % (ref 0.0–13.0)
NEUT%: 62.8 % (ref 40.0–80.0)
NEUTROS ABS: 4.7 10*3/uL (ref 1.5–6.5)
Platelets: 267 10*3/uL (ref 145–400)
RBC: 4.7 10*6/uL (ref 4.20–5.70)
RDW: 15.3 % (ref 11.1–15.7)
WBC: 7.5 10*3/uL (ref 4.0–10.0)

## 2015-01-07 LAB — CMP (CANCER CENTER ONLY)
ALBUMIN: 3.9 g/dL (ref 3.3–5.5)
ALK PHOS: 63 U/L (ref 26–84)
ALT: 18 U/L (ref 10–47)
AST: 29 U/L (ref 11–38)
BILIRUBIN TOTAL: 0.6 mg/dL (ref 0.20–1.60)
BUN: 31 mg/dL — AB (ref 7–22)
CHLORIDE: 98 meq/L (ref 98–108)
CO2: 29 mEq/L (ref 18–33)
CREATININE: 2.5 mg/dL — AB (ref 0.6–1.2)
Calcium: 9.5 mg/dL (ref 8.0–10.3)
Glucose, Bld: 202 mg/dL — ABNORMAL HIGH (ref 73–118)
Potassium: 3.1 mEq/L — ABNORMAL LOW (ref 3.3–4.7)
SODIUM: 142 meq/L (ref 128–145)
TOTAL PROTEIN: 8.9 g/dL — AB (ref 6.4–8.1)

## 2015-01-07 LAB — FERRITIN CHCC

## 2015-01-07 MED ORDER — TESTOSTERONE CYPIONATE 200 MG/ML IM SOLN
INTRAMUSCULAR | Status: AC
  Filled 2015-01-07: qty 2

## 2015-01-07 MED ORDER — SODIUM CHLORIDE 0.9 % IV SOLN
60.0000 mg | Freq: Once | INTRAVENOUS | Status: AC
  Administered 2015-01-07: 60 mg via INTRAVENOUS
  Filled 2015-01-07: qty 10

## 2015-01-07 MED ORDER — TESTOSTERONE CYPIONATE 200 MG/ML IM SOLN
300.0000 mg | INTRAMUSCULAR | Status: DC
  Administered 2015-01-07: 300 mg via INTRAMUSCULAR

## 2015-01-07 NOTE — Progress Notes (Signed)
Hematology and Oncology Follow Up Visit  [name] [id number] 12/01/1950 64 y.o. 01/07/2015   Principle Diagnosis:  1. IgG kappa myeloma. 2. Anemia secondary to renal insufficiency. 3. Intermittent iron-deficiency anemia. 4. Hypotestosteronemia  Current Therapy:   1. Aredia 60 mg IV q.2 months  2. Aranesp 300 mcg subcu as needed for hemoglobin less than 11. 3. Depo-Testosterone 300 mg q.2 weeks. 4. IV iron as indicated.     Interim History:  [name] is back for followup.he was admitted after we saw him last time. He had a upper GI done.  He has some issues after the upper GI. He is hospitalized for 3 days. He is constipated. I'm not sure what the upper GI finally showed.  He had a CT of the abdomen done. This was relatively unremarkable.  He was put on Bentyl and Linzess for constipation.  As far as his myeloma is concerned, this was not been a problem. His last monoclonal studies done back in September showed a monoclonal spike of 0.8 g/dL. His IgG level was 2370 mg/dL. His Light chain was 5.3 mg/dL.   He does have some fibromyalgia. This is B all that bad right now.  The Aredia does seem to help his bones.  He is on testosterone. This also is helping.  Overall, his performance status is ECOG 1-2.  Medications:  Current outpatient prescriptions:  .  amLODipine (NORVASC) 10 MG tablet, Take 10 mg by mouth daily., Disp: , Rfl:  .  CARAFATE 1 GM/10ML suspension, Take 1 g by mouth 4 (four) times daily. , Disp: , Rfl:  .  diazepam (VALIUM) 5 MG tablet, Take 5 mg by mouth every 4 (four) hours as needed for anxiety., Disp: , Rfl:  .  dicyclomine (BENTYL) 10 MG capsule, Take 1 capsule (10 mg total) by mouth 3 (three) times daily., Disp: 90 capsule, Rfl: 2 .  furosemide (LASIX) 40 MG tablet, Take 40 mg by mouth., Disp: , Rfl:  .  glipiZIDE (GLUCOTROL) 5 MG tablet, Take 5 mg by mouth daily before breakfast., Disp: , Rfl:  .  Hypromellose (NATURAL BALANCE TEARS OP),  Apply 1 drop to eye every morning. , Disp: , Rfl:  .  Linaclotide (LINZESS) 145 MCG CAPS capsule, Take 1 capsule (145 mcg total) by mouth daily before breakfast., Disp: 30 capsule, Rfl: 2 .  meclizine (ANTIVERT) 50 MG tablet, Take 0.5 tablets (25 mg total) by mouth 4 (four) times daily as needed for dizziness., Disp: 30 tablet, Rfl: 0 .  methocarbamol (ROBAXIN) 500 MG tablet, Take 500 mg by mouth 2 (two) times daily as needed for muscle spasms. , Disp: , Rfl:  .  omeprazole (PRILOSEC) 20 MG capsule, Take 1 capsule (20 mg total) by mouth daily., Disp: 30 capsule, Rfl: 11 .  ondansetron (ZOFRAN ODT) 4 MG disintegrating tablet, Take 1 tablet (4 mg total) by mouth every 8 (eight) hours as needed for nausea or vomiting., Disp: 10 tablet, Rfl: 0 .  OxyCODONE (OXYCONTIN) 80 mg T12A 12 hr tablet, Take 80 mg by mouth 3 (three) times daily., Disp: , Rfl:  .  oxyCODONE-acetaminophen (PERCOCET) 10-325 MG per tablet, Take 1 tablet by mouth every 6 (six) hours as needed for pain. , Disp: , Rfl:  .  Polyvinyl Alcohol-Povidone (CLEAR EYES ALL SEASONS) 5-6 MG/ML SOLN, Apply 2 drops to eye 3 (three) times daily as needed (drye eyes.). , Disp: , Rfl:  .  potassium chloride SA (K-DUR,KLOR-CON) 20 MEQ tablet, Take 1 tablet (20  mEq total) by mouth 2 (two) times daily., Disp: 60 tablet, Rfl: 6 .  prochlorperazine (COMPAZINE) 10 MG tablet, Take 1 tablet (10 mg total) by mouth every 6 (six) hours as needed for nausea or vomiting., Disp: 60 tablet, Rfl: 2 .  Tamsulosin HCl (FLOMAX) 0.4 MG CAPS, Take 0.4 mg by mouth daily. , Disp: , Rfl:  .  triamterene-hydrochlorothiazide (MAXZIDE-25) 37.5-25 MG per tablet, Take 1 tablet by mouth daily.  , Disp: , Rfl:  .  Vitamin D, Ergocalciferol, (DRISDOL) 50000 UNITS CAPS capsule, Take 50,000 Units by mouth every 7 (seven) days., Disp: , Rfl:  .  [DISCONTINUED] potassium chloride (KLOR-CON) 20 MEQ packet, Take 20 mEq by mouth daily. , Disp: , Rfl:  No current facility-administered  medications for this visit.  Facility-Administered Medications Ordered in Other Visits:  .  pamidronate (AREDIA) 60 mg in sodium chloride 0.9 % 500 mL IVPB, 60 mg, Intravenous, Once, Adrian Napoleon, MD  Allergies:  Allergies  Allergen Reactions  . Iodine Anxiety and Rash    Didn't feel right "instructed not to take per MD--something with his port"    Past Medical History, Surgical history, Social history, and Family History were reviewed and updated.  Review of Systems: As above  Physical Exam:  height is 5\' 11"  (1.803 m) and weight is 199 lb (90.266 kg). His oral temperature is 97.7 F (36.5 C). His blood pressure is 180/76 and his pulse is 97. His respiration is 16.   His head and neck exam shows no ocular or oral lesions. He has no adenopathy in the neck. His thyroid is non-palpable. Lungs are clear. Cardiac exam regular rate and rhythm with no murmurs, rubs or bruits.. Abdomen is soft. His abdomen is slightly distended. He has good bowel sounds. There is no fluid wave. There is no palpable liver or spleen tip. Back exam shows no tenderness in the lumbar spine. There is no paravertebral muscle spasms. . He has slightly decreased range of motion of the lower back. Extremities shows no clubbing cyanosis or edema. He has decent strength in his legs. Skin exam shows no rashes. Neurological exam is nonfocal.  Lab Results  Component Value Date   WBC 7.5 01/07/2015   HGB 11.8* 01/07/2015   HCT 35.3* 01/07/2015   MCV 75* 01/07/2015   PLT 267 01/07/2015     Chemistry      Component Value Date/Time   NA 142 01/07/2015 0754   NA 139 11/15/2014 0422   NA 137 08/23/2012 2105   K 3.1* 01/07/2015 0754   K 3.7 11/15/2014 0422   K 4.0 08/23/2012 2105   CL 98 01/07/2015 0754   CL 105 11/15/2014 0422   CL 101 08/23/2012 2105   CO2 29 01/07/2015 0754   CO2 28 11/15/2014 0422   CO2 32 08/23/2012 2105   BUN 31* 01/07/2015 0754   BUN 23* 11/15/2014 0422   BUN 23* 08/23/2012 2105    CREATININE 2.5* 01/07/2015 0754   CREATININE 1.99* 11/15/2014 0422   CREATININE 2.53* 08/23/2012 2105      Component Value Date/Time   CALCIUM 9.5 01/07/2015 0754   CALCIUM 8.3* 11/15/2014 0422   CALCIUM 8.7 08/23/2012 2105   ALKPHOS 63 01/07/2015 0754   ALKPHOS 53 11/14/2014 1647   ALKPHOS 101 08/23/2012 2105   AST 29 01/07/2015 0754   AST 21 11/14/2014 1647   AST 27 08/23/2012 2105   ALT 18 01/07/2015 0754   ALT 23 11/14/2014 1647   ALT 21  08/23/2012 2105   BILITOT 0.60 01/07/2015 0754   BILITOT 0.5 11/14/2014 1647   BILITOT 0.2 08/23/2012 2105         Impression and Plan: [name] is a 64 year old gentleman. He has multiple medical problems. I still do not think that myeloma is an issue. He has fibromyalgia. He has diabetes. He has low testosterone.  We will go ahead and give him Aredia. He gets it every 2 months.  I think the testosterone is helping him and also helping with his anemia.  We will go ahead and give him testosterone today.  I'll plan to see him back in about 2 months  . Adrian Napoleon, MD 11/14/20168:59 AM

## 2015-01-07 NOTE — Patient Instructions (Signed)
Pamidronate injection What is this medicine? PAMIDRONATE (pa mi DROE nate) slows calcium loss from bones. It is used to treat high calcium blood levels from cancer or Paget's disease. It is also used to treat bone pain and prevent fractures from certain cancers that have spread to the bone. This medicine may be used for other purposes; ask your health care provider or pharmacist if you have questions. What should I tell my health care provider before I take this medicine? They need to know if you have any of these conditions: -aspirin-sensitive asthma -dental disease -kidney disease -an unusual or allergic reaction to pamidronate, other medicines, foods, dyes, or preservatives -pregnant or trying to get pregnant -breast-feeding How should I use this medicine? This medicine is for infusion into a vein. It is given by a health care professional in a hospital or clinic setting. Talk to your pediatrician regarding the use of this medicine in children. This medicine is not approved for use in children. Overdosage: If you think you have taken too much of this medicine contact a poison control center or emergency room at once. NOTE: This medicine is only for you. Do not share this medicine with others. What if I miss a dose? This does not apply. What may interact with this medicine? -certain antibiotics given by injection -medicines for inflammation or pain like ibuprofen, naproxen -some diuretics like bumetanide, furosemide -cyclosporine -parathyroid hormone -tacrolimus -teriparatide -thalidomide This list may not describe all possible interactions. Give your health care provider a list of all the medicines, herbs, non-prescription drugs, or dietary supplements you use. Also tell them if you smoke, drink alcohol, or use illegal drugs. Some items may interact with your medicine. What should I watch for while using this medicine? Visit your doctor or health care professional for regular  checkups. It may be some time before you see the benefit from this medicine. Do not stop taking your medicine unless your doctor tells you to. Your doctor may order blood tests or other tests to see how you are doing. Women should inform their doctor if they wish to become pregnant or think they might be pregnant. There is a potential for serious side effects to an unborn child. Talk to your health care professional or pharmacist for more information. You should make sure that you get enough calcium and vitamin D while you are taking this medicine. Discuss the foods you eat and the vitamins you take with your health care professional. Some people who take this medicine have severe bone, joint, and/or muscle pain. This medicine may also increase your risk for a broken thigh bone. Tell your doctor right away if you have pain in your upper leg or groin. Tell your doctor if you have any pain that does not go away or that gets worse. What side effects may I notice from receiving this medicine? Side effects that you should report to your doctor or health care professional as soon as possible: -allergic reactions like skin rash, itching or hives, swelling of the face, lips, or tongue -black or tarry stools -changes in vision -eye inflammation, pain -high blood pressure -jaw pain, especially burning or cramping -muscle weakness -numb, tingling pain -swelling of feet or hands -trouble passing urine or change in the amount of urine -unable to move easily Side effects that usually do not require medical attention (report to your doctor or health care professional if they continue or are bothersome): -bone, joint, or muscle pain -constipation -dizzy, drowsy -fever -headache -loss of  appetite -nausea, vomiting -pain at site where injected This list may not describe all possible side effects. Call your doctor for medical advice about side effects. You may report side effects to FDA at  1-800-FDA-1088. Where should I keep my medicine? This drug is given in a hospital or clinic and will not be stored at home. NOTE: This sheet is a summary. It may not cover all possible information. If you have questions about this medicine, talk to your doctor, pharmacist, or health care provider.    2016, Elsevier/Gold Standard. (2010-08-08 08:49:49)  

## 2015-01-10 LAB — IFE INTERPRETATION

## 2015-01-21 ENCOUNTER — Ambulatory Visit (HOSPITAL_BASED_OUTPATIENT_CLINIC_OR_DEPARTMENT_OTHER): Payer: Medicare Other

## 2015-01-21 VITALS — BP 138/84 | HR 105 | Temp 98.0°F | Resp 18

## 2015-01-21 DIAGNOSIS — E291 Testicular hypofunction: Secondary | ICD-10-CM | POA: Diagnosis present

## 2015-01-21 DIAGNOSIS — E349 Endocrine disorder, unspecified: Secondary | ICD-10-CM

## 2015-01-21 MED ORDER — TESTOSTERONE CYPIONATE 200 MG/ML IM SOLN
300.0000 mg | INTRAMUSCULAR | Status: DC
  Administered 2015-01-21: 300 mg via INTRAMUSCULAR

## 2015-01-21 MED ORDER — TESTOSTERONE CYPIONATE 200 MG/ML IM SOLN
INTRAMUSCULAR | Status: AC
  Filled 2015-01-21: qty 2

## 2015-01-21 NOTE — Patient Instructions (Signed)

## 2015-01-25 ENCOUNTER — Encounter: Payer: Self-pay | Admitting: Gastroenterology

## 2015-02-04 ENCOUNTER — Ambulatory Visit (HOSPITAL_BASED_OUTPATIENT_CLINIC_OR_DEPARTMENT_OTHER): Payer: Medicare Other

## 2015-02-04 DIAGNOSIS — E291 Testicular hypofunction: Secondary | ICD-10-CM

## 2015-02-04 DIAGNOSIS — E349 Endocrine disorder, unspecified: Secondary | ICD-10-CM

## 2015-02-04 MED ORDER — TESTOSTERONE CYPIONATE 200 MG/ML IM SOLN
INTRAMUSCULAR | Status: AC
  Filled 2015-02-04: qty 2

## 2015-02-04 MED ORDER — TESTOSTERONE CYPIONATE 200 MG/ML IM SOLN
300.0000 mg | INTRAMUSCULAR | Status: DC
  Administered 2015-02-04: 300 mg via INTRAMUSCULAR

## 2015-02-04 NOTE — Patient Instructions (Signed)

## 2015-02-14 ENCOUNTER — Encounter (HOSPITAL_COMMUNITY): Payer: Self-pay

## 2015-02-14 ENCOUNTER — Emergency Department (HOSPITAL_COMMUNITY): Payer: Medicare Other

## 2015-02-14 ENCOUNTER — Emergency Department (HOSPITAL_COMMUNITY)
Admission: EM | Admit: 2015-02-14 | Discharge: 2015-02-14 | Disposition: A | Discharge status: Home / Self Care | Payer: Medicare Other | Attending: Emergency Medicine | Admitting: Emergency Medicine

## 2015-02-14 DIAGNOSIS — F419 Anxiety disorder, unspecified: Secondary | ICD-10-CM | POA: Insufficient documentation

## 2015-02-14 DIAGNOSIS — Z7984 Long term (current) use of oral hypoglycemic drugs: Secondary | ICD-10-CM | POA: Diagnosis not present

## 2015-02-14 DIAGNOSIS — Z8619 Personal history of other infectious and parasitic diseases: Secondary | ICD-10-CM | POA: Insufficient documentation

## 2015-02-14 DIAGNOSIS — Z87448 Personal history of other diseases of urinary system: Secondary | ICD-10-CM | POA: Diagnosis not present

## 2015-02-14 DIAGNOSIS — R55 Syncope and collapse: Secondary | ICD-10-CM | POA: Diagnosis not present

## 2015-02-14 DIAGNOSIS — E785 Hyperlipidemia, unspecified: Secondary | ICD-10-CM | POA: Diagnosis not present

## 2015-02-14 DIAGNOSIS — Z862 Personal history of diseases of the blood and blood-forming organs and certain disorders involving the immune mechanism: Secondary | ICD-10-CM | POA: Insufficient documentation

## 2015-02-14 DIAGNOSIS — K5900 Constipation, unspecified: Secondary | ICD-10-CM | POA: Diagnosis not present

## 2015-02-14 DIAGNOSIS — M1990 Unspecified osteoarthritis, unspecified site: Secondary | ICD-10-CM | POA: Insufficient documentation

## 2015-02-14 DIAGNOSIS — E1140 Type 2 diabetes mellitus with diabetic neuropathy, unspecified: Secondary | ICD-10-CM | POA: Insufficient documentation

## 2015-02-14 DIAGNOSIS — I10 Essential (primary) hypertension: Secondary | ICD-10-CM | POA: Insufficient documentation

## 2015-02-14 DIAGNOSIS — Z8579 Personal history of other malignant neoplasms of lymphoid, hematopoietic and related tissues: Secondary | ICD-10-CM | POA: Diagnosis not present

## 2015-02-14 DIAGNOSIS — R42 Dizziness and giddiness: Secondary | ICD-10-CM | POA: Diagnosis not present

## 2015-02-14 DIAGNOSIS — Z79899 Other long term (current) drug therapy: Secondary | ICD-10-CM | POA: Diagnosis not present

## 2015-02-14 DIAGNOSIS — R600 Localized edema: Secondary | ICD-10-CM | POA: Diagnosis not present

## 2015-02-14 LAB — I-STAT CHEM 8, ED
BUN: 27 mg/dL — ABNORMAL HIGH (ref 6–20)
CHLORIDE: 97 mmol/L — AB (ref 101–111)
Calcium, Ion: 0.98 mmol/L — ABNORMAL LOW (ref 1.13–1.30)
Creatinine, Ser: 2.2 mg/dL — ABNORMAL HIGH (ref 0.61–1.24)
GLUCOSE: 190 mg/dL — AB (ref 65–99)
HEMATOCRIT: 43 % (ref 39.0–52.0)
HEMOGLOBIN: 14.6 g/dL (ref 13.0–17.0)
POTASSIUM: 3.2 mmol/L — AB (ref 3.5–5.1)
SODIUM: 139 mmol/L (ref 135–145)
TCO2: 30 mmol/L (ref 0–100)

## 2015-02-14 LAB — BASIC METABOLIC PANEL
ANION GAP: 10 (ref 5–15)
BUN: 21 mg/dL — AB (ref 6–20)
CHLORIDE: 99 mmol/L — AB (ref 101–111)
CO2: 27 mmol/L (ref 22–32)
Calcium: 8.6 mg/dL — ABNORMAL LOW (ref 8.9–10.3)
Creatinine, Ser: 2.38 mg/dL — ABNORMAL HIGH (ref 0.61–1.24)
GFR calc Af Amer: 31 mL/min — ABNORMAL LOW (ref >60)
GFR calc non Af Amer: 27 mL/min — ABNORMAL LOW (ref >60)
Glucose, Bld: 194 mg/dL — ABNORMAL HIGH (ref 65–99)
POTASSIUM: 3.4 mmol/L — AB (ref 3.5–5.1)
SODIUM: 136 mmol/L (ref 135–145)

## 2015-02-14 LAB — CBC WITH DIFFERENTIAL/PLATELET
BASOS ABS: 0 10*3/uL (ref 0.0–0.1)
Basophils Relative: 1 %
EOS ABS: 0.2 10*3/uL (ref 0.0–0.7)
Eosinophils Relative: 2 %
HCT: 35 % — ABNORMAL LOW (ref 39.0–52.0)
HEMOGLOBIN: 11.3 g/dL — AB (ref 13.0–17.0)
LYMPHS ABS: 1.5 10*3/uL (ref 0.7–4.0)
LYMPHS PCT: 18 %
MCH: 24.5 pg — AB (ref 26.0–34.0)
MCHC: 32.3 g/dL (ref 30.0–36.0)
MCV: 75.9 fL — AB (ref 78.0–100.0)
Monocytes Absolute: 0.7 10*3/uL (ref 0.1–1.0)
Monocytes Relative: 9 %
NEUTROS PCT: 70 %
Neutro Abs: 5.9 10*3/uL (ref 1.7–7.7)
Platelets: 225 10*3/uL (ref 150–400)
RBC: 4.61 MIL/uL (ref 4.22–5.81)
RDW: 15.3 % (ref 11.5–15.5)
WBC: 8.4 10*3/uL (ref 4.0–10.5)

## 2015-02-14 LAB — CBG MONITORING, ED: Glucose-Capillary: 158 mg/dL — ABNORMAL HIGH (ref 65–99)

## 2015-02-14 LAB — I-STAT TROPONIN, ED: Troponin i, poc: 0.01 ng/mL (ref 0.00–0.08)

## 2015-02-14 LAB — MAGNESIUM: MAGNESIUM: 1.9 mg/dL (ref 1.7–2.4)

## 2015-02-14 MED ORDER — POTASSIUM CHLORIDE CRYS ER 20 MEQ PO TBCR
20.0000 meq | EXTENDED_RELEASE_TABLET | Freq: Once | ORAL | Status: AC
  Administered 2015-02-14: 20 meq via ORAL
  Filled 2015-02-14: qty 1

## 2015-02-14 MED ORDER — POTASSIUM CHLORIDE CRYS ER 20 MEQ PO TBCR: 60.0000 meq | EXTENDED_RELEASE_TABLET | Freq: Once | ORAL | Status: DC

## 2015-02-14 MED ORDER — DIAZEPAM 2 MG PO TABS
2.0000 mg | ORAL_TABLET | Freq: Once | ORAL | Status: AC
  Administered 2015-02-14: 2 mg via ORAL
  Filled 2015-02-14: qty 1

## 2015-02-14 MED ORDER — MECLIZINE HCL 25 MG PO TABS
50.0000 mg | ORAL_TABLET | Freq: Once | ORAL | Status: AC
  Administered 2015-02-14: 50 mg via ORAL
  Filled 2015-02-14: qty 2

## 2015-02-14 MED ORDER — SODIUM CHLORIDE 0.9 % IV BOLUS (SEPSIS)
1000.0000 mL | Freq: Once | INTRAVENOUS | Status: AC
  Administered 2015-02-14: 1000 mL via INTRAVENOUS

## 2015-02-14 MED ORDER — OXYCODONE-ACETAMINOPHEN 5-325 MG PO TABS
2.0000 | ORAL_TABLET | Freq: Once | ORAL | Status: AC
  Administered 2015-02-14: 2 via ORAL
  Filled 2015-02-14: qty 2

## 2015-02-14 NOTE — Discharge Instructions (Signed)
Dizziness [name], your tests today did not show a cause for your dizziness.  See your primary care doctor within 3 days for close follow up. If symptoms worsen, come back to the ED immediately.  Thank you. Dizziness is a common problem. It makes you feel unsteady or lightheaded. You may feel like you are about to pass out (faint). Dizziness can lead to injury if you stumble or fall. Anyone can get dizzy, but dizziness is more common in older adults. This condition can be caused by a number of things, including:  Medicines.  Dehydration.  Illness. HOME CARE Following these instructions may help with your condition: Eating and Drinking  Drink enough fluid to keep your pee (urine) clear or pale yellow. This helps to keep you from getting dehydrated. Try to drink more clear fluids, such as water.  Do not drink alcohol.  Limit how much caffeine you drink or eat if told by your doctor.  Limit how much salt you drink or eat if told by your doctor. Activity  Avoid making quick movements.  When you stand up from sitting in a chair, steady yourself until you feel okay.  In the morning, first sit up on the side of the bed. When you feel okay, stand slowly while you hold onto something. Do this until you know that your balance is fine.  Move your legs often if you need to stand in one place for a long time. Tighten and relax your muscles in your legs while you are standing.  Do not drive or use heavy machinery if you feel dizzy.  Avoid bending down if you feel dizzy. Place items in your home so that they are easy for you to reach without leaning over. Lifestyle  Do not use any tobacco products, including cigarettes, chewing tobacco, or electronic cigarettes. If you need help quitting, ask your doctor.  Try to lower your stress level, such as with yoga or meditation. Talk with your doctor if you need help. General Instructions  Watch your dizziness for any changes.  Take medicines  only as told by your doctor. Talk with your doctor if you think that your dizziness is caused by a medicine that you are taking.  Tell a friend or a family member that you are feeling dizzy. If he or she notices any changes in your behavior, have this person call your doctor.  Keep all follow-up visits as told by your doctor. This is important. GET HELP IF:  Your dizziness does not go away.  Your dizziness or light-headedness gets worse.  You feel sick to your stomach (nauseous).  You have trouble hearing.  You have new symptoms.  You are unsteady on your feet or you feel like the room is spinning. GET HELP RIGHT AWAY IF:  You throw up (vomit) or have diarrhea and are unable to eat or drink anything.  You have trouble:  Talking.  Walking.  Swallowing.  Using your arms, hands, or legs.  You feel generally weak.  You are not thinking clearly or you have trouble forming sentences. It may take a friend or family member to notice this.  You have:  Chest pain.  Pain in your belly (abdomen).  Shortness of breath.  Sweating.  Your vision changes.  You are bleeding.  You have a headache.  You have neck pain or a stiff neck.  You have a fever.   This information is not intended to replace advice given to you by your health  care provider. Make sure you discuss any questions you have with your health care provider.   Document Released: 01/29/2011 Document Revised: 06/26/2014 Document Reviewed: 02/05/2014 Elsevier Interactive Patient Education [employer].

## 2015-02-14 NOTE — ED Notes (Signed)
Port attempt X1, unsuccessful, no blood return

## 2015-02-14 NOTE — ED Notes (Signed)
Pt states that he has been feeling faint for the past couple days, pt is diabetic and has not been checking his blood sugar, pt states that he feels like he is "swimming in the ocean", recently had increase in his water pill.

## 2015-02-14 NOTE — ED Provider Notes (Signed)
CSN: [id number]     Arrival date & time 02/14/15  2951 History  By signing my name below, I, Irene Pap, attest that this documentation has been prepared under the direction and in the presence of Everlene Balls, MD. Electronically Signed: Irene Pap, ED Scribe. 02/14/2015. 3:42 AM.  Chief Complaint  Patient presents with  . Near Syncope   The history is provided by the patient. No language interpreter was used.  HPI Comments: [name] is a 64 y.o. Male with a hx of multiple myeloma, type 2 DM, HTN, anemia, renal insufficiency, and diabetic neuropathy who presents to the Emergency Department complaining of gradually worsening lightheadedness and near syncope onset a few days ago. Pt states, "I feel like I'm walking in the ocean." He reports that he last checked his CBG a few days ago, but has not checked it today. He reports, "I came in today because I can't make it anymore." He reports a recent increase in his "water pill." He reports taking his medication as directed but "I don't eat all that well." He denies syncope, fever, chills, nausea, vomiting, or diarrhea.    Past Medical History  Diagnosis Date  . Multiple myeloma ([place])   . Diabetes mellitus without complication ([place])   . Multiple myeloma   . Hypertension   . Renal insufficiency   . Diverticulosis   . Hemorrhoids   . Type 2 diabetes mellitus ([place])   . Anxiety disorder   . Urinary retention   . Anemia   . Diabetic neuropathy ([place])   . Constipation   . Herpes zoster   . Arthritis   . Hyperlipidemia   . Myeloma ([place]) 12/31/2010  . Arthralgia of multiple joints 12/31/2010  . Anemia, iron deficiency 03/12/2011  . 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 Hypotestosteronism 03/17/2012   Past Surgical History  Procedure Laterality Date  . Knee surgery    . Knee arthroscopy      left  . Portacath placement    . Cataract extraction      bi-lateral  . Transurethral resection of prostate  01/2011    Dr. at [facility] in [place]  . Colonoscopy    . Esophagogastroduodenoscopy    . Colonoscopy  07/13/2011    Procedure: COLONOSCOPY;  Surgeon: Ladene Artist, MD,FACG;  Location: [facility] ENDOSCOPY;  Service: Endoscopy;  Laterality: N/A;   Family History  Problem Relation Age of Onset  . Breast cancer Sister   . Breast cancer Mother   . Diabetes Sister   . Bone cancer Father   . Colon cancer Neg Hx   . Heart disease Sister   . Heart disease Mother   . Heart disease Daughter   . Throat cancer Brother   . Stomach cancer Brother    Social History  Substance Use Topics  . Smoking status: Never Smoker   . Smokeless tobacco: Never Used     Comment: never used tobacco  . Alcohol Use: No    Review of Systems 10 Systems reviewed and all are negative for acute change except as noted in the HPI.  Allergies  Iodine  Home Medications   Prior to Admission medications   Medication Sig Start Date End Date Taking? Authorizing Provider  amLODipine (NORVASC) 10 MG tablet Take 10 mg by mouth daily.    Historical Provider, MD  CARAFATE 1 GM/10ML suspension Take 1 g by mouth 4 (four) times daily.  07/28/11   Historical Provider, MD  diazepam (VALIUM) 5  MG tablet Take 5 mg by mouth every 4 (four) hours as needed for anxiety.    Historical Provider, MD  dicyclomine (BENTYL) 10 MG capsule Take 1 capsule (10 mg total) by mouth 3 (three) times daily. 11/16/14   Jessica D Zehr, PA-C  furosemide (LASIX) 40 MG tablet Take 40 mg by mouth.    Historical Provider, MD  glipiZIDE (GLUCOTROL) 5 MG tablet Take 5 mg by mouth daily before breakfast.    Historical Provider, MD  Hypromellose (NATURAL BALANCE TEARS OP) Apply 1 drop to eye every morning.     Historical Provider, MD  Linaclotide Rolan Lipa) 145 MCG CAPS capsule Take 1 capsule (145 mcg total) by mouth daily before breakfast. 11/16/14   Laban Emperor Zehr, PA-C  meclizine (ANTIVERT) 50 MG tablet Take 0.5 tablets (25 mg total) by mouth 4 (four) times daily as needed for  dizziness. 12/13/11   Virgel Manifold, MD  methocarbamol (ROBAXIN) 500 MG tablet Take 500 mg by mouth 2 (two) times daily as needed for muscle spasms.     Historical Provider, MD  omeprazole (PRILOSEC) 20 MG capsule Take 1 capsule (20 mg total) by mouth daily. 08/30/13   Ladene Artist, MD  ondansetron (ZOFRAN ODT) 4 MG disintegrating tablet Take 1 tablet (4 mg total) by mouth every 8 (eight) hours as needed for nausea or vomiting. 09/20/14   Alvina Chou, PA-C  OxyCODONE (OXYCONTIN) 80 mg T12A 12 hr tablet Take 80 mg by mouth 3 (three) times daily.    Historical Provider, MD  oxyCODONE-acetaminophen (PERCOCET) 10-325 MG per tablet Take 1 tablet by mouth every 6 (six) hours as needed for pain.     Historical Provider, MD  Polyvinyl Alcohol-Povidone (CLEAR EYES ALL SEASONS) 5-6 MG/ML SOLN Apply 2 drops to eye 3 (three) times daily as needed (drye eyes.).     Historical Provider, MD  potassium chloride SA (K-DUR,KLOR-CON) 20 MEQ tablet Take 1 tablet (20 mEq total) by mouth 2 (two) times daily. 11/07/14   Volanda Napoleon, MD  prochlorperazine (COMPAZINE) 10 MG tablet Take 1 tablet (10 mg total) by mouth every 6 (six) hours as needed for nausea or vomiting. 04/18/14   Volanda Napoleon, MD  Tamsulosin HCl (FLOMAX) 0.4 MG CAPS Take 0.4 mg by mouth daily.     Historical Provider, MD  triamterene-hydrochlorothiazide (MAXZIDE-25) 37.5-25 MG per tablet Take 1 tablet by mouth daily.      Historical Provider, MD  Vitamin D, Ergocalciferol, (DRISDOL) 50000 UNITS CAPS capsule Take 50,000 Units by mouth every 7 (seven) days.    Historical Provider, MD   BP 167/82 mmHg  Pulse 90  Temp(Src) 98.1 F (36.7 C) (Oral)  Ht '5\' 11"'  (1.803 m)  Wt 198 lb (89.812 kg)  BMI 27.63 kg/m2  SpO2 100% Physical Exam  Constitutional: He is oriented to person, place, and time. Vital signs are normal. He appears well-developed and well-nourished.  Non-toxic appearance. He does not appear ill. No distress.  HENT:  Head:  Normocephalic and atraumatic.  Nose: Nose normal.  Mouth/Throat: Oropharynx is clear and moist. No oropharyngeal exudate.  Eyes: Conjunctivae and EOM are normal. Pupils are equal, round, and reactive to light. No scleral icterus.  Neck: Normal range of motion. Neck supple. No tracheal deviation, no edema, no erythema and normal range of motion present. No thyroid mass and no thyromegaly present.  Cardiovascular: Normal rate, regular rhythm, S1 normal, S2 normal, normal heart sounds, intact distal pulses and normal pulses.  Exam reveals no gallop and  no friction rub.   No murmur heard. Pulmonary/Chest: Effort normal and breath sounds normal. No respiratory distress. He has no wheezes. He has no rhonchi. He has no rales.  Port placed in right chest wall  Abdominal: Soft. Normal appearance and bowel sounds are normal. He exhibits no distension, no ascites and no mass. There is no hepatosplenomegaly. There is no tenderness. There is no rebound, no guarding and no CVA tenderness.  Musculoskeletal: Normal range of motion. He exhibits edema. He exhibits no tenderness.  Trace edema noted to the bilateral lower extremities.  Lymphadenopathy:    He has no cervical adenopathy.  Neurological: He is alert and oriented to person, place, and time. He has normal strength. No cranial nerve deficit or sensory deficit.  Skin: Skin is warm, dry and intact. No petechiae and no rash noted. He is not diaphoretic. No erythema. No pallor.  Psychiatric: He has a normal mood and affect. His behavior is normal. Judgment normal.  Nursing note and vitals reviewed.   ED Course  Procedures (including critical care time) DIAGNOSTIC STUDIES: Oxygen Saturation is 100% on RA, normal by my interpretation.    COORDINATION OF CARE: 3:37 AM-Discussed treatment plan which includes labs and x-ray with pt at bedside and pt agreed to plan.    Labs Review Labs Reviewed  CBC WITH DIFFERENTIAL/PLATELET - Abnormal; Notable for the  following:    Hemoglobin 11.3 (*)    HCT 35.0 (*)    MCV 75.9 (*)    MCH 24.5 (*)    All other components within normal limits  BASIC METABOLIC PANEL - Abnormal; Notable for the following:    Potassium 3.4 (*)    Chloride 99 (*)    Glucose, Bld 194 (*)    BUN 21 (*)    Creatinine, Ser 2.38 (*)    Calcium 8.6 (*)    GFR calc non Af Amer 27 (*)    GFR calc Af Amer 31 (*)    All other components within normal limits  CBG MONITORING, ED - Abnormal; Notable for the following:    Glucose-Capillary 158 (*)    All other components within normal limits  I-STAT CHEM 8, ED - Abnormal; Notable for the following:    Potassium 3.2 (*)    Chloride 97 (*)    BUN 27 (*)    Creatinine, Ser 2.20 (*)    Glucose, Bld 190 (*)    Calcium, Ion 0.98 (*)    All other components within normal limits  MAGNESIUM  URINALYSIS, ROUTINE W REFLEX MICROSCOPIC (NOT AT [facility])  Randolm Idol, ED    Imaging Review Dg Chest 2 View  02/14/2015  CLINICAL DATA:  Dizziness. EXAM: CHEST  2 VIEW COMPARISON:  11/14/2014 FINDINGS: Chronic cardiomegaly. Stable aortic and hilar contours. There is no edema, consolidation, effusion, or pneumothorax. No acute osseous finding. Right IJ porta catheter, tip stable at the SVC level. IMPRESSION: No active cardiopulmonary disease. Electronically Signed   By: Monte Fantasia M.D.   On: 02/14/2015 04:38   Ct Head Wo Contrast  02/14/2015  CLINICAL DATA:  Dizziness.  Multiple myeloma EXAM: CT HEAD WITHOUT CONTRAST TECHNIQUE: Contiguous axial images were obtained from the base of the skull through the vertex without intravenous contrast. COMPARISON:  08/24/2012 FINDINGS: Skull and Sinuses:Negative for fracture or destructive process. The visualized mastoids, middle ears, and imaged paranasal sinuses are clear. Visualized orbits: Bilateral cataract resection.  No acute finding. Brain: Unremarkable for age. No evidence of acute infarction, hemorrhage, hydrocephalus, or  mass lesion/mass  effect. IMPRESSION: Negative head CT. Electronically Signed   By: Monte Fantasia M.D.   On: 02/14/2015 04:43   I have personally reviewed and evaluated these images and lab results as part of my medical decision-making.   EKG Interpretation   Date/Time:  Thursday February 14 2015 03:40:00 EST Ventricular Rate:  89 PR Interval:  164 QRS Duration: 95 QT Interval:  344 QTC Calculation: 418 R Axis:   87 Text Interpretation:  Sinus rhythm LAE, consider biatrial enlargement  Borderline right axis deviation Borderline T wave abnormalities No  significant change since last tracing Confirmed by Glynn Octave  [phone]) on 02/14/2015 3:44:01 AM      MDM   Final diagnoses:  None    Patient presents to the ED for lightheadedness for the past 3 days.  He states it has gotten worse, so he presents for evaluation.  He is denying any other symptoms.  ED workup is unremarkable.  He was given IVF and potassium replacement.  He was observed in the ED overnight and has had not reccurrence of his symptoms.  PCP fu advised within 3 days.  He appears well and in NAD.  VS remain within his normal limits and he is safe for DC.   I personally performed the services described in this documentation, which was scribed in my presence. The recorded information has been reviewed and is accurate.      Everlene Balls, MD 02/14/15 [phone]

## 2015-02-19 ENCOUNTER — Ambulatory Visit (HOSPITAL_BASED_OUTPATIENT_CLINIC_OR_DEPARTMENT_OTHER): Payer: Medicare Other

## 2015-02-19 VITALS — BP 131/68 | HR 88 | Temp 98.4°F | Resp 16

## 2015-02-19 DIAGNOSIS — E291 Testicular hypofunction: Secondary | ICD-10-CM | POA: Diagnosis not present

## 2015-02-19 DIAGNOSIS — C9000 Multiple myeloma not having achieved remission: Secondary | ICD-10-CM

## 2015-02-19 DIAGNOSIS — E349 Endocrine disorder, unspecified: Secondary | ICD-10-CM

## 2015-02-19 MED ORDER — SODIUM CHLORIDE 0.9 % IV SOLN: 60.0000 mg | Freq: Once | INTRAVENOUS | Status: DC

## 2015-02-19 MED ORDER — TESTOSTERONE CYPIONATE 200 MG/ML IM SOLN
300.0000 mg | INTRAMUSCULAR | Status: DC
  Administered 2015-02-19: 300 mg via INTRAMUSCULAR

## 2015-02-19 MED ORDER — TESTOSTERONE CYPIONATE 200 MG/ML IM SOLN
INTRAMUSCULAR | Status: AC
  Filled 2015-02-19: qty 2

## 2015-02-19 NOTE — Patient Instructions (Signed)

## 2015-03-04 ENCOUNTER — Ambulatory Visit (HOSPITAL_BASED_OUTPATIENT_CLINIC_OR_DEPARTMENT_OTHER): Payer: Medicare Other

## 2015-03-04 VITALS — Temp 98.2°F

## 2015-03-04 DIAGNOSIS — E349 Endocrine disorder, unspecified: Secondary | ICD-10-CM

## 2015-03-04 DIAGNOSIS — E291 Testicular hypofunction: Secondary | ICD-10-CM

## 2015-03-04 MED ORDER — TESTOSTERONE CYPIONATE 200 MG/ML IM SOLN
INTRAMUSCULAR | Status: AC
  Filled 2015-03-04: qty 2

## 2015-03-04 MED ORDER — TESTOSTERONE CYPIONATE 200 MG/ML IM SOLN
300.0000 mg | INTRAMUSCULAR | Status: DC
  Administered 2015-03-04: 300 mg via INTRAMUSCULAR

## 2015-03-04 NOTE — Patient Instructions (Signed)

## 2015-03-12 ENCOUNTER — Ambulatory Visit: Payer: Self-pay | Admitting: Hematology & Oncology

## 2015-03-12 ENCOUNTER — Other Ambulatory Visit: Payer: Self-pay

## 2015-03-12 ENCOUNTER — Ambulatory Visit: Payer: Self-pay

## 2015-03-13 ENCOUNTER — Other Ambulatory Visit (HOSPITAL_BASED_OUTPATIENT_CLINIC_OR_DEPARTMENT_OTHER): Payer: Medicare Other

## 2015-03-13 ENCOUNTER — Ambulatory Visit (HOSPITAL_BASED_OUTPATIENT_CLINIC_OR_DEPARTMENT_OTHER): Payer: Medicare Other | Admitting: Hematology & Oncology

## 2015-03-13 ENCOUNTER — Encounter: Payer: Self-pay | Admitting: Hematology & Oncology

## 2015-03-13 ENCOUNTER — Ambulatory Visit (HOSPITAL_BASED_OUTPATIENT_CLINIC_OR_DEPARTMENT_OTHER): Payer: Medicare Other

## 2015-03-13 DIAGNOSIS — D649 Anemia, unspecified: Secondary | ICD-10-CM

## 2015-03-13 DIAGNOSIS — D631 Anemia in chronic kidney disease: Secondary | ICD-10-CM

## 2015-03-13 DIAGNOSIS — E119 Type 2 diabetes mellitus without complications: Secondary | ICD-10-CM

## 2015-03-13 DIAGNOSIS — N289 Disorder of kidney and ureter, unspecified: Secondary | ICD-10-CM

## 2015-03-13 DIAGNOSIS — C9000 Multiple myeloma not having achieved remission: Secondary | ICD-10-CM | POA: Diagnosis present

## 2015-03-13 DIAGNOSIS — E291 Testicular hypofunction: Secondary | ICD-10-CM | POA: Diagnosis not present

## 2015-03-13 DIAGNOSIS — E349 Endocrine disorder, unspecified: Secondary | ICD-10-CM

## 2015-03-13 DIAGNOSIS — N189 Chronic kidney disease, unspecified: Secondary | ICD-10-CM

## 2015-03-13 DIAGNOSIS — C9001 Multiple myeloma in remission: Secondary | ICD-10-CM

## 2015-03-13 DIAGNOSIS — D509 Iron deficiency anemia, unspecified: Secondary | ICD-10-CM

## 2015-03-13 LAB — CBC WITH DIFFERENTIAL (CANCER CENTER ONLY)
BASO#: 0.1 10*3/uL (ref 0.0–0.2)
BASO%: 0.9 % (ref 0.0–2.0)
EOS ABS: 0.3 10*3/uL (ref 0.0–0.5)
EOS%: 3.6 % (ref 0.0–7.0)
HCT: 40 % (ref 38.7–49.9)
HEMOGLOBIN: 13.5 g/dL (ref 13.0–17.1)
LYMPH#: 2 10*3/uL (ref 0.9–3.3)
LYMPH%: 29.2 % (ref 14.0–48.0)
MCH: 24.2 pg — AB (ref 28.0–33.4)
MCHC: 33.8 g/dL (ref 32.0–35.9)
MCV: 72 fL — ABNORMAL LOW (ref 82–98)
MONO#: 0.8 10*3/uL (ref 0.1–0.9)
MONO%: 11.4 % (ref 0.0–13.0)
NEUT%: 54.9 % (ref 40.0–80.0)
NEUTROS ABS: 3.8 10*3/uL (ref 1.5–6.5)
Platelets: 235 10*3/uL (ref 145–400)
RBC: 5.57 10*6/uL (ref 4.20–5.70)
RDW: 17 % — AB (ref 11.1–15.7)
WBC: 7 10*3/uL (ref 4.0–10.0)

## 2015-03-13 LAB — CMP (CANCER CENTER ONLY)
ALBUMIN: 4 g/dL (ref 3.3–5.5)
ALT(SGPT): 22 U/L (ref 10–47)
AST: 26 U/L (ref 11–38)
Alkaline Phosphatase: 65 U/L (ref 26–84)
BUN, Bld: 14 mg/dL (ref 7–22)
CHLORIDE: 97 meq/L — AB (ref 98–108)
CO2: 26 meq/L (ref 18–33)
CREATININE: 2.2 mg/dL — AB (ref 0.6–1.2)
Calcium: 9 mg/dL (ref 8.0–10.3)
GLUCOSE: 114 mg/dL (ref 73–118)
Potassium: 3.7 mEq/L (ref 3.3–4.7)
SODIUM: 138 meq/L (ref 128–145)
Total Bilirubin: 0.6 mg/dl (ref 0.20–1.60)
Total Protein: 8.2 g/dL — ABNORMAL HIGH (ref 6.4–8.1)

## 2015-03-13 LAB — IRON AND TIBC
%SAT: 34 % (ref 20–55)
Iron: 76 ug/dL (ref 42–163)
TIBC: 226 ug/dL (ref 202–409)
UIBC: 150 ug/dL (ref 117–376)

## 2015-03-13 LAB — FERRITIN

## 2015-03-13 MED ORDER — TESTOSTERONE CYPIONATE 200 MG/ML IM SOLN: 300.0000 mg | INTRAMUSCULAR | Status: DC

## 2015-03-13 MED ORDER — SODIUM CHLORIDE 0.9 % IV SOLN
60.0000 mg | Freq: Once | INTRAVENOUS | Status: AC
  Administered 2015-03-13: 60 mg via INTRAVENOUS
  Filled 2015-03-13: qty 10

## 2015-03-13 MED ORDER — MORPHINE SULFATE (PF) 10 MG/ML IV SOLN
INTRAVENOUS | Status: AC
  Filled 2015-03-13: qty 1

## 2015-03-13 MED ORDER — SODIUM CHLORIDE 0.9 % IJ SOLN
10.0000 mL | INTRAMUSCULAR | Status: DC | PRN
  Administered 2015-03-13: 10 mL via INTRAVENOUS
  Filled 2015-03-13: qty 10

## 2015-03-13 MED ORDER — MORPHINE SULFATE 4 MG/ML IJ SOLN
4.0000 mg | INTRAMUSCULAR | Status: DC | PRN
  Administered 2015-03-13: 4 mg via INTRAVENOUS
  Filled 2015-03-13: qty 1

## 2015-03-13 MED ORDER — HEPARIN SOD (PORK) LOCK FLUSH 100 UNIT/ML IV SOLN
500.0000 [IU] | Freq: Once | INTRAVENOUS | Status: AC
  Administered 2015-03-13: 500 [IU] via INTRAVENOUS
  Filled 2015-03-13: qty 5

## 2015-03-13 NOTE — Patient Instructions (Signed)
Pamidronate injection What is this medicine? PAMIDRONATE (pa mi DROE nate) slows calcium loss from bones. It is used to treat high calcium blood levels from cancer or Paget's disease. It is also used to treat bone pain and prevent fractures from certain cancers that have spread to the bone. This medicine may be used for other purposes; ask your health care provider or pharmacist if you have questions. COMMON BRAND NAME(S): Aredia What should I tell my health care provider before I take this medicine? They need to know if you have any of these conditions: -aspirin-sensitive asthma -dental disease -kidney disease -an unusual or allergic reaction to pamidronate, other medicines, foods, dyes, or preservatives -pregnant or trying to get pregnant -breast-feeding How should I use this medicine? This medicine is for infusion into a vein. It is given by a health care professional in a hospital or clinic setting. Talk to your pediatrician regarding the use of this medicine in children. This medicine is not approved for use in children. Overdosage: If you think you have taken too much of this medicine contact a poison control center or emergency room at once. NOTE: This medicine is only for you. Do not share this medicine with others. What if I miss a dose? This does not apply. What may interact with this medicine? -certain antibiotics given by injection -medicines for inflammation or pain like ibuprofen, naproxen -some diuretics like bumetanide, furosemide -cyclosporine -parathyroid hormone -tacrolimus -teriparatide -thalidomide This list may not describe all possible interactions. Give your health care provider a list of all the medicines, herbs, non-prescription drugs, or dietary supplements you use. Also tell them if you smoke, drink alcohol, or use illegal drugs. Some items may interact with your medicine. What should I watch for while using this medicine? Visit your doctor or health care  professional for regular checkups. It may be some time before you see the benefit from this medicine. Do not stop taking your medicine unless your doctor tells you to. Your doctor may order blood tests or other tests to see how you are doing. Women should inform their doctor if they wish to become pregnant or think they might be pregnant. There is a potential for serious side effects to an unborn child. Talk to your health care professional or pharmacist for more information. You should make sure that you get enough calcium and vitamin D while you are taking this medicine. Discuss the foods you eat and the vitamins you take with your health care professional. Some people who take this medicine have severe bone, joint, and/or muscle pain. This medicine may also increase your risk for a broken thigh bone. Tell your doctor right away if you have pain in your upper leg or groin. Tell your doctor if you have any pain that does not go away or that gets worse. What side effects may I notice from receiving this medicine? Side effects that you should report to your doctor or health care professional as soon as possible: -allergic reactions like skin rash, itching or hives, swelling of the face, lips, or tongue -black or tarry stools -changes in vision -eye inflammation, pain -high blood pressure -jaw pain, especially burning or cramping -muscle weakness -numb, tingling pain -swelling of feet or hands -trouble passing urine or change in the amount of urine -unable to move easily Side effects that usually do not require medical attention (report to your doctor or health care professional if they continue or are bothersome): -bone, joint, or muscle pain -constipation -dizzy, drowsy -  fever -headache -loss of appetite -nausea, vomiting -pain at site where injected This list may not describe all possible side effects. Call your doctor for medical advice about side effects. You may report side effects to  FDA at 1-800-FDA-1088. Where should I keep my medicine? This drug is given in a hospital or clinic and will not be stored at home. NOTE: This sheet is a summary. It may not cover all possible information. If you have questions about this medicine, talk to your doctor, pharmacist, or health care provider.  2015, [employer]. (2010-08-08 08:49:49)

## 2015-03-13 NOTE — Progress Notes (Signed)
Hematology and Oncology Follow Up Visit  Christophr Mileto [id number] October 05, 1950 65 y.o. 03/13/2015   Principle Diagnosis:  1. IgG kappa myeloma. 2. Anemia secondary to renal insufficiency. 3. Intermittent iron-deficiency anemia. 4. Hypotestosteronemia  Current Therapy:   1. Aredia 60 mg IV q.2 months  2. Aranesp 300 mcg subcu as needed for hemoglobin less than 11. 3. Depo-Testosterone 300 mg q.2 weeks. 4. IV iron as indicated.     Interim History:  Mr.  Senick is back for followup.he was admitted after we saw him last time. He had a upper GI done.  He has some issues after the upper GI. He was hospitalized for 3 days. He is constipated. I'm not sure what the upper GI finally showed.  He currently is being seen by urology. I'm not sure exactly what the issues are.  He had a CT of the abdomen done. This was relatively unremarkable.  He was put on Bentyl and Linzess for constipation.  As far as his myeloma is concerned, this was not been a problem. His last monoclonal studies done back in September showed a monoclonal spike of 0.8 g/dL. His IgG level was 2370 mg/dL. His Light chain was 5.3 mg/dL.   He does have some fibromyalgia. This is B all that bad right now.  The Aredia does seem to help his bones.  He is on testosterone. This also is helping.  Overall, his performance status is ECOG 1-2.  Medications:  Current outpatient prescriptions:  .  amLODipine (NORVASC) 10 MG tablet, Take 10 mg by mouth daily., Disp: , Rfl:  .  CARAFATE 1 GM/10ML suspension, Take 1 g by mouth 4 (four) times daily. , Disp: , Rfl:  .  diazepam (VALIUM) 5 MG tablet, Take 5 mg by mouth every 4 (four) hours as needed for anxiety., Disp: , Rfl:  .  dicyclomine (BENTYL) 10 MG capsule, Take 1 capsule (10 mg total) by mouth 3 (three) times daily., Disp: 90 capsule, Rfl: 2 .  furosemide (LASIX) 40 MG tablet, Take 40 mg by mouth., Disp: , Rfl:  .  glipiZIDE (GLUCOTROL) 5 MG tablet, Take 5 mg by mouth  daily before breakfast., Disp: , Rfl:  .  Hypromellose (NATURAL BALANCE TEARS OP), Apply 1 drop to eye every morning. , Disp: , Rfl:  .  Linaclotide (LINZESS) 145 MCG CAPS capsule, Take 1 capsule (145 mcg total) by mouth daily before breakfast., Disp: 30 capsule, Rfl: 2 .  meclizine (ANTIVERT) 50 MG tablet, Take 0.5 tablets (25 mg total) by mouth 4 (four) times daily as needed for dizziness., Disp: 30 tablet, Rfl: 0 .  methocarbamol (ROBAXIN) 500 MG tablet, Take 500 mg by mouth 2 (two) times daily as needed for muscle spasms. , Disp: , Rfl:  .  omeprazole (PRILOSEC) 20 MG capsule, Take 1 capsule (20 mg total) by mouth daily., Disp: 30 capsule, Rfl: 11 .  ondansetron (ZOFRAN ODT) 4 MG disintegrating tablet, Take 1 tablet (4 mg total) by mouth every 8 (eight) hours as needed for nausea or vomiting., Disp: 10 tablet, Rfl: 0 .  OxyCODONE (OXYCONTIN) 80 mg T12A 12 hr tablet, Take 80 mg by mouth 3 (three) times daily., Disp: , Rfl:  .  oxyCODONE-acetaminophen (PERCOCET) 10-325 MG per tablet, Take 1 tablet by mouth every 6 (six) hours as needed for pain. , Disp: , Rfl:  .  Polyvinyl Alcohol-Povidone (CLEAR EYES ALL SEASONS) 5-6 MG/ML SOLN, Apply 2 drops to eye 3 (three) times daily as needed (drye eyes.). , Disp: ,  Rfl:  .  potassium chloride SA (K-DUR,KLOR-CON) 20 MEQ tablet, Take 1 tablet (20 mEq total) by mouth 2 (two) times daily., Disp: 60 tablet, Rfl: 6 .  prochlorperazine (COMPAZINE) 10 MG tablet, Take 1 tablet (10 mg total) by mouth every 6 (six) hours as needed for nausea or vomiting., Disp: 60 tablet, Rfl: 2 .  Sennosides (LAXATIVE PILLS PO), Take 1 tablet by mouth daily as needed (constipation)., Disp: , Rfl:  .  Tamsulosin HCl (FLOMAX) 0.4 MG CAPS, Take 0.4 mg by mouth daily. , Disp: , Rfl:  .  triamterene-hydrochlorothiazide (MAXZIDE-25) 37.5-25 MG per tablet, Take 1 tablet by mouth daily.  , Disp: , Rfl:  .  Vitamin D, Ergocalciferol, (DRISDOL) 50000 UNITS CAPS capsule, Take 50,000 Units by  mouth every 7 (seven) days., Disp: , Rfl:  .  [DISCONTINUED] potassium chloride (KLOR-CON) 20 MEQ packet, Take 20 mEq by mouth daily. , Disp: , Rfl:   Current facility-administered medications:  .  morphine 4 MG/ML injection 4 mg, 4 mg, Intravenous, Q4H PRN, Volanda Napoleon, MD, 4 mg at 03/13/15 1243  Facility-Administered Medications Ordered in Other Visits:  .  sodium chloride 0.9 % injection 10 mL, 10 mL, Intravenous, PRN, Volanda Napoleon, MD, 10 mL at 03/13/15 1417  Allergies:  Allergies  Allergen Reactions  . Iodine Anxiety and Rash    Didn't feel right "instructed not to take per MD--something with his port"    Past Medical History, Surgical history, Social history, and Family History were reviewed and updated.  Review of Systems: As above  Physical Exam:  vitals were not taken for this visit.  His head and neck exam shows no ocular or oral lesions. He has no adenopathy in the neck. His thyroid is non-palpable. Lungs are clear. Cardiac exam regular rate and rhythm with no murmurs, rubs or bruits.. Abdomen is soft. His abdomen is slightly distended. He has good bowel sounds. There is no fluid wave. There is no palpable liver or spleen tip. Back exam shows no tenderness in the lumbar spine. There is no paravertebral muscle spasms. . He has slightly decreased range of motion of the lower back. Extremities shows no clubbing cyanosis or edema. He has decent strength in his legs. Skin exam shows no rashes. Neurological exam is nonfocal.  Lab Results  Component Value Date   WBC 7.0 03/13/2015   HGB 13.5 03/13/2015   HCT 40.0 03/13/2015   MCV 72* 03/13/2015   PLT 235 03/13/2015     Chemistry      Component Value Date/Time   NA 138 03/13/2015 0840   NA 139 02/14/2015 0409   NA 137 08/23/2012 2105   K 3.7 03/13/2015 0840   K 3.2* 02/14/2015 0409   K 4.0 08/23/2012 2105   CL 97* 03/13/2015 0840   CL 97* 02/14/2015 0409   CL 101 08/23/2012 2105   CO2 26 03/13/2015 0840   CO2  27 02/14/2015 0402   CO2 32 08/23/2012 2105   BUN 14 03/13/2015 0840   BUN 27* 02/14/2015 0409   BUN 23* 08/23/2012 2105   CREATININE 2.2* 03/13/2015 0840   CREATININE 2.20* 02/14/2015 0409   CREATININE 2.53* 08/23/2012 2105      Component Value Date/Time   CALCIUM 9.0 03/13/2015 0840   CALCIUM 8.6* 02/14/2015 0402   CALCIUM 8.7 08/23/2012 2105   ALKPHOS 65 03/13/2015 0840   ALKPHOS 53 11/14/2014 1647   ALKPHOS 101 08/23/2012 2105   AST 26 03/13/2015 0840   AST  21 11/14/2014 1647   AST 27 08/23/2012 2105   ALT 22 03/13/2015 0840   ALT 23 11/14/2014 1647   ALT 21 08/23/2012 2105   BILITOT 0.60 03/13/2015 0840   BILITOT 0.5 11/14/2014 1647   BILITOT 0.2 08/23/2012 2105         Impression and Plan: Mr. Dastrup is a 64 year old gentleman. He has multiple medical problems. I still do not think that myeloma is an issue. He has fibromyalgia. He has diabetes. He has low testosterone.  We will go ahead and give him Aredia. He gets it every 2 months.  I think the testosterone is helping him and also helping with his anemia.  We will go ahead and give him testosterone today.  I'll plan to see him back in about 2 months  . Volanda Napoleon, MD 1/18/20176:12 PM

## 2015-03-14 LAB — KAPPA/LAMBDA LIGHT CHAINS
IG KAPPA FREE LIGHT CHAIN: 36.23 mg/L — AB (ref 3.30–19.40)
Ig Lambda Free Light Chain: 23.79 mg/L (ref 5.71–26.30)
Kappa/Lambda FluidC Ratio: 1.52 (ref 0.26–1.65)

## 2015-03-14 LAB — IGG, IGA, IGM
IgA, Qn, Serum: 310 mg/dL (ref 61–437)
IgG, Qn, Serum: 1921 mg/dL — ABNORMAL HIGH (ref 700–1600)
IgM, Qn, Serum: 70 mg/dL (ref 20–172)

## 2015-03-14 LAB — TESTOSTERONE

## 2015-03-15 LAB — PROTEIN ELECTROPHORESIS, SERUM, WITH REFLEX
A/G RATIO SPE: 0.9 (ref 0.7–1.7)
ALBUMIN: 3.7 g/dL (ref 2.9–4.4)
Alpha 1: 0.2 g/dL (ref 0.0–0.4)
Alpha 2: 1 g/dL (ref 0.4–1.0)
Beta: 1.1 g/dL (ref 0.7–1.3)
GAMMA GLOBULIN: 1.9 g/dL — AB (ref 0.4–1.8)
GLOBULIN, TOTAL: 4.2 g/dL — AB (ref 2.2–3.9)
Interpretation(See Below): 0
M-SPIKE, %: 1.1 g/dL — AB
TOTAL PROTEIN: 7.9 g/dL (ref 6.0–8.5)

## 2015-03-18 ENCOUNTER — Ambulatory Visit (HOSPITAL_BASED_OUTPATIENT_CLINIC_OR_DEPARTMENT_OTHER): Payer: Medicare Other

## 2015-03-18 VITALS — BP 157/88 | HR 102 | Temp 99.0°F | Resp 16

## 2015-03-18 DIAGNOSIS — E291 Testicular hypofunction: Secondary | ICD-10-CM

## 2015-03-18 DIAGNOSIS — E349 Endocrine disorder, unspecified: Secondary | ICD-10-CM

## 2015-03-18 MED ORDER — TESTOSTERONE CYPIONATE 200 MG/ML IM SOLN
300.0000 mg | INTRAMUSCULAR | Status: DC
  Administered 2015-03-18: 300 mg via INTRAMUSCULAR

## 2015-03-18 MED ORDER — TESTOSTERONE CYPIONATE 200 MG/ML IM SOLN
INTRAMUSCULAR | Status: AC
  Filled 2015-03-18: qty 2

## 2015-03-18 NOTE — Patient Instructions (Signed)

## 2015-04-01 ENCOUNTER — Ambulatory Visit (HOSPITAL_BASED_OUTPATIENT_CLINIC_OR_DEPARTMENT_OTHER): Payer: Medicare Other

## 2015-04-01 VITALS — BP 163/81 | HR 98 | Temp 98.8°F | Resp 16

## 2015-04-01 DIAGNOSIS — E291 Testicular hypofunction: Secondary | ICD-10-CM

## 2015-04-01 DIAGNOSIS — E349 Endocrine disorder, unspecified: Secondary | ICD-10-CM

## 2015-04-01 MED ORDER — TESTOSTERONE CYPIONATE 200 MG/ML IM SOLN
INTRAMUSCULAR | Status: AC
  Filled 2015-04-01: qty 2

## 2015-04-01 MED ORDER — TESTOSTERONE CYPIONATE 200 MG/ML IM SOLN
300.0000 mg | INTRAMUSCULAR | Status: DC
  Administered 2015-04-01: 300 mg via INTRAMUSCULAR

## 2015-04-01 NOTE — Patient Instructions (Signed)

## 2015-04-15 ENCOUNTER — Ambulatory Visit (HOSPITAL_BASED_OUTPATIENT_CLINIC_OR_DEPARTMENT_OTHER): Payer: Medicare Other

## 2015-04-15 VITALS — BP 140/70 | HR 94 | Temp 98.7°F | Resp 16

## 2015-04-15 DIAGNOSIS — E291 Testicular hypofunction: Secondary | ICD-10-CM | POA: Diagnosis present

## 2015-04-15 DIAGNOSIS — E349 Endocrine disorder, unspecified: Secondary | ICD-10-CM

## 2015-04-15 MED ORDER — TESTOSTERONE CYPIONATE 200 MG/ML IM SOLN
INTRAMUSCULAR | Status: AC
  Filled 2015-04-15: qty 2

## 2015-04-15 MED ORDER — TESTOSTERONE CYPIONATE 200 MG/ML IM SOLN
300.0000 mg | INTRAMUSCULAR | Status: DC
  Administered 2015-04-15: 300 mg via INTRAMUSCULAR

## 2015-04-15 NOTE — Patient Instructions (Signed)

## 2015-04-17 ENCOUNTER — Ambulatory Visit: Payer: Self-pay | Admitting: Hematology & Oncology

## 2015-04-17 ENCOUNTER — Ambulatory Visit: Payer: Self-pay

## 2015-04-17 ENCOUNTER — Other Ambulatory Visit: Payer: Self-pay

## 2015-04-29 ENCOUNTER — Ambulatory Visit (HOSPITAL_BASED_OUTPATIENT_CLINIC_OR_DEPARTMENT_OTHER): Payer: Medicare Other

## 2015-04-29 VITALS — BP 124/74 | HR 102 | Temp 98.3°F | Resp 16

## 2015-04-29 DIAGNOSIS — E291 Testicular hypofunction: Secondary | ICD-10-CM | POA: Diagnosis present

## 2015-04-29 DIAGNOSIS — E349 Endocrine disorder, unspecified: Secondary | ICD-10-CM

## 2015-04-29 MED ORDER — TESTOSTERONE CYPIONATE 200 MG/ML IM SOLN
300.0000 mg | INTRAMUSCULAR | Status: DC
  Administered 2015-04-29: 300 mg via INTRAMUSCULAR

## 2015-04-29 NOTE — Patient Instructions (Signed)

## 2015-04-30 MED ORDER — TESTOSTERONE CYPIONATE 200 MG/ML IM SOLN
INTRAMUSCULAR | Status: AC
  Filled 2015-04-30: qty 2

## 2015-05-13 ENCOUNTER — Ambulatory Visit (HOSPITAL_BASED_OUTPATIENT_CLINIC_OR_DEPARTMENT_OTHER): Payer: Medicare Other

## 2015-05-13 VITALS — BP 138/74 | HR 97 | Temp 98.9°F | Resp 16

## 2015-05-13 DIAGNOSIS — E291 Testicular hypofunction: Secondary | ICD-10-CM

## 2015-05-13 DIAGNOSIS — E349 Endocrine disorder, unspecified: Secondary | ICD-10-CM

## 2015-05-13 MED ORDER — TESTOSTERONE CYPIONATE 200 MG/ML IM SOLN
300.0000 mg | INTRAMUSCULAR | Status: DC
  Administered 2015-05-13: 300 mg via INTRAMUSCULAR

## 2015-05-13 MED ORDER — TESTOSTERONE CYPIONATE 200 MG/ML IM SOLN
INTRAMUSCULAR | Status: AC
  Filled 2015-05-13: qty 2

## 2015-05-13 NOTE — Patient Instructions (Signed)

## 2015-05-15 ENCOUNTER — Other Ambulatory Visit: Payer: Self-pay | Admitting: Family

## 2015-05-15 ENCOUNTER — Ambulatory Visit (HOSPITAL_BASED_OUTPATIENT_CLINIC_OR_DEPARTMENT_OTHER): Payer: Medicare Other | Admitting: Hematology & Oncology

## 2015-05-15 ENCOUNTER — Encounter: Payer: Self-pay | Admitting: Hematology & Oncology

## 2015-05-15 ENCOUNTER — Ambulatory Visit (HOSPITAL_BASED_OUTPATIENT_CLINIC_OR_DEPARTMENT_OTHER): Payer: Medicare Other

## 2015-05-15 ENCOUNTER — Other Ambulatory Visit (HOSPITAL_BASED_OUTPATIENT_CLINIC_OR_DEPARTMENT_OTHER): Payer: Medicare Other

## 2015-05-15 ENCOUNTER — Ambulatory Visit (HOSPITAL_BASED_OUTPATIENT_CLINIC_OR_DEPARTMENT_OTHER)
Admission: RE | Admit: 2015-05-15 | Discharge: 2015-05-15 | Disposition: A | Discharge status: Home / Self Care | Payer: Medicare Other | Source: Ambulatory Visit | Attending: Hematology & Oncology | Admitting: Hematology & Oncology

## 2015-05-15 VITALS — BP 159/71 | HR 106 | Temp 98.4°F | Resp 18 | Ht 71.0 in | Wt 201.0 lb

## 2015-05-15 DIAGNOSIS — E119 Type 2 diabetes mellitus without complications: Secondary | ICD-10-CM

## 2015-05-15 DIAGNOSIS — N289 Disorder of kidney and ureter, unspecified: Secondary | ICD-10-CM

## 2015-05-15 DIAGNOSIS — C9000 Multiple myeloma not having achieved remission: Secondary | ICD-10-CM

## 2015-05-15 DIAGNOSIS — E349 Endocrine disorder, unspecified: Secondary | ICD-10-CM

## 2015-05-15 DIAGNOSIS — D631 Anemia in chronic kidney disease: Secondary | ICD-10-CM | POA: Diagnosis not present

## 2015-05-15 DIAGNOSIS — D649 Anemia, unspecified: Secondary | ICD-10-CM | POA: Diagnosis not present

## 2015-05-15 DIAGNOSIS — E291 Testicular hypofunction: Secondary | ICD-10-CM | POA: Insufficient documentation

## 2015-05-15 DIAGNOSIS — D509 Iron deficiency anemia, unspecified: Secondary | ICD-10-CM

## 2015-05-15 DIAGNOSIS — N189 Chronic kidney disease, unspecified: Secondary | ICD-10-CM

## 2015-05-15 DIAGNOSIS — C9001 Multiple myeloma in remission: Secondary | ICD-10-CM

## 2015-05-15 LAB — CMP (CANCER CENTER ONLY)
ALT(SGPT): 25 U/L (ref 10–47)
AST: 30 U/L (ref 11–38)
Albumin: 3.7 g/dL (ref 3.3–5.5)
Alkaline Phosphatase: 74 U/L (ref 26–84)
BILIRUBIN TOTAL: 0.7 mg/dL (ref 0.20–1.60)
BUN, Bld: 20 mg/dL (ref 7–22)
CALCIUM: 9 mg/dL (ref 8.0–10.3)
CHLORIDE: 98 meq/L (ref 98–108)
CO2: 29 meq/L (ref 18–33)
Creat: 2.4 mg/dl — ABNORMAL HIGH (ref 0.6–1.2)
Glucose, Bld: 227 mg/dL — ABNORMAL HIGH (ref 73–118)
POTASSIUM: 3.4 meq/L (ref 3.3–4.7)
Sodium: 139 mEq/L (ref 128–145)
Total Protein: 8.1 g/dL (ref 6.4–8.1)

## 2015-05-15 LAB — CBC WITH DIFFERENTIAL (CANCER CENTER ONLY)
BASO#: 0.1 10*3/uL (ref 0.0–0.2)
BASO%: 0.6 % (ref 0.0–2.0)
EOS ABS: 0.2 10*3/uL (ref 0.0–0.5)
EOS%: 2.5 % (ref 0.0–7.0)
HCT: 35.5 % — ABNORMAL LOW (ref 38.7–49.9)
HGB: 11.9 g/dL — ABNORMAL LOW (ref 13.0–17.1)
LYMPH#: 1.8 10*3/uL (ref 0.9–3.3)
LYMPH%: 22 % (ref 14.0–48.0)
MCH: 24.7 pg — AB (ref 28.0–33.4)
MCHC: 33.5 g/dL (ref 32.0–35.9)
MCV: 74 fL — ABNORMAL LOW (ref 82–98)
MONO#: 0.8 10*3/uL (ref 0.1–0.9)
MONO%: 9.4 % (ref 0.0–13.0)
NEUT#: 5.4 10*3/uL (ref 1.5–6.5)
NEUT%: 65.5 % (ref 40.0–80.0)
PLATELETS: 215 10*3/uL (ref 145–400)
RBC: 4.82 10*6/uL (ref 4.20–5.70)
RDW: 17.7 % — AB (ref 11.1–15.7)
WBC: 8.3 10*3/uL (ref 4.0–10.0)

## 2015-05-15 LAB — IRON AND TIBC
%SAT: 28 % (ref 20–55)
IRON: 64 ug/dL (ref 42–163)
TIBC: 231 ug/dL (ref 202–409)
UIBC: 167 ug/dL (ref 117–376)

## 2015-05-15 LAB — FERRITIN: Ferritin: 1275 ng/ml — ABNORMAL HIGH (ref 22–316)

## 2015-05-15 MED ORDER — SODIUM CHLORIDE 0.9 % IV SOLN: 60.0000 mg | Freq: Once | INTRAVENOUS | Status: DC

## 2015-05-15 MED ORDER — MORPHINE SULFATE 4 MG/ML IJ SOLN
4.0000 mg | INTRAMUSCULAR | Status: AC | PRN
  Filled 2015-05-15: qty 1

## 2015-05-15 MED ORDER — MORPHINE SULFATE (PF) 4 MG/ML IV SOLN
INTRAVENOUS | Status: AC
  Filled 2015-05-15: qty 1

## 2015-05-15 MED ORDER — MORPHINE SULFATE 4 MG/ML IJ SOLN
4.0000 mg | Freq: Once | INTRAMUSCULAR | Status: AC
  Administered 2015-05-15: 4 mg via INTRAVENOUS
  Filled 2015-05-15: qty 1

## 2015-05-15 MED ORDER — SODIUM CHLORIDE 0.9 % IV SOLN
60.0000 mg | Freq: Once | INTRAVENOUS | Status: AC
  Administered 2015-05-15: 60 mg via INTRAVENOUS
  Filled 2015-05-15: qty 10

## 2015-05-15 NOTE — Patient Instructions (Signed)
Pamidronate injection What is this medicine? PAMIDRONATE (pa mi DROE nate) slows calcium loss from bones. It is used to treat high calcium blood levels from cancer or Paget's disease. It is also used to treat bone pain and prevent fractures from certain cancers that have spread to the bone. This medicine may be used for other purposes; ask your health care provider or pharmacist if you have questions. What should I tell my health care provider before I take this medicine? They need to know if you have any of these conditions: -aspirin-sensitive asthma -dental disease -kidney disease -an unusual or allergic reaction to pamidronate, other medicines, foods, dyes, or preservatives -pregnant or trying to get pregnant -breast-feeding How should I use this medicine? This medicine is for infusion into a vein. It is given by a health care professional in a hospital or clinic setting. Talk to your pediatrician regarding the use of this medicine in children. This medicine is not approved for use in children. Overdosage: If you think you have taken too much of this medicine contact a poison control center or emergency room at once. NOTE: This medicine is only for you. Do not share this medicine with others. What if I miss a dose? This does not apply. What may interact with this medicine? -certain antibiotics given by injection -medicines for inflammation or pain like ibuprofen, naproxen -some diuretics like bumetanide, furosemide -cyclosporine -parathyroid hormone -tacrolimus -teriparatide -thalidomide This list may not describe all possible interactions. Give your health care provider a list of all the medicines, herbs, non-prescription drugs, or dietary supplements you use. Also tell them if you smoke, drink alcohol, or use illegal drugs. Some items may interact with your medicine. What should I watch for while using this medicine? Visit your doctor or health care professional for regular  checkups. It may be some time before you see the benefit from this medicine. Do not stop taking your medicine unless your doctor tells you to. Your doctor may order blood tests or other tests to see how you are doing. Women should inform their doctor if they wish to become pregnant or think they might be pregnant. There is a potential for serious side effects to an unborn child. Talk to your health care professional or pharmacist for more information. You should make sure that you get enough calcium and vitamin D while you are taking this medicine. Discuss the foods you eat and the vitamins you take with your health care professional. Some people who take this medicine have severe bone, joint, and/or muscle pain. This medicine may also increase your risk for a broken thigh bone. Tell your doctor right away if you have pain in your upper leg or groin. Tell your doctor if you have any pain that does not go away or that gets worse. What side effects may I notice from receiving this medicine? Side effects that you should report to your doctor or health care professional as soon as possible: -allergic reactions like skin rash, itching or hives, swelling of the face, lips, or tongue -black or tarry stools -changes in vision -eye inflammation, pain -high blood pressure -jaw pain, especially burning or cramping -muscle weakness -numb, tingling pain -swelling of feet or hands -trouble passing urine or change in the amount of urine -unable to move easily Side effects that usually do not require medical attention (report to your doctor or health care professional if they continue or are bothersome): -bone, joint, or muscle pain -constipation -dizzy, drowsy -fever -headache -loss of  appetite -nausea, vomiting -pain at site where injected This list may not describe all possible side effects. Call your doctor for medical advice about side effects. You may report side effects to FDA at  1-800-FDA-1088. Where should I keep my medicine? This drug is given in a hospital or clinic and will not be stored at home. NOTE: This sheet is a summary. It may not cover all possible information. If you have questions about this medicine, talk to your doctor, pharmacist, or health care provider.    2016, Elsevier/Gold Standard. (2010-08-08 08:49:49)  

## 2015-05-15 NOTE — Progress Notes (Signed)
Hematology and Oncology Follow Up Visit  Tanvir Cirrincione [id number] 05-30-1950 65 y.o. 05/15/2015   Principle Diagnosis:  1. IgG kappa myeloma. 2. Anemia secondary to renal insufficiency. 3. Intermittent iron-deficiency anemia. 4. Hypotestosteronemia  Current Therapy:   1. Aredia 60 mg IV q.2 months  2. Aranesp 300 mcg subcu as needed for hemoglobin less than 11. 3. Depo-Testosterone 300 mg q.2 weeks. 4. IV iron as indicated.     Interim History:  Mr.  Drumm is back for followup. He sees we don't really well. He is complaining of some pain in his scalp. This is the right parietal area. I cannot find any swelling or redness. Not sure exactly what might be going on there. Given that he does have myeloma, I suppose that this might be a bony lesion. I probably would check a CT scan to see how things look.  Back in January, his M spike was 1.1 g/dL. His IgG level was 1921 mg/dL.  Back in January, his ferritin was 1630 with an iron saturation of 34%.  His blood sugars seem to be doing okay.  He does have low testosterone. He basically is post menopausal. He does get testosterone injections. This helped some out quite a bit. It gives him more energy and stamina. It also helps his blood counts.  He does have renal insufficiency. This is from his diabetes. Aranesp does seem to help. We've not had a give him Aranesp for quite a while.  Overall, his performance status is ECOG 1-2.  Medications:  Current outpatient prescriptions:  .  amLODipine (NORVASC) 10 MG tablet, Take 10 mg by mouth daily., Disp: , Rfl:  .  CARAFATE 1 GM/10ML suspension, Take 1 g by mouth 4 (four) times daily. , Disp: , Rfl:  .  diazepam (VALIUM) 5 MG tablet, Take 5 mg by mouth every 4 (four) hours as needed for anxiety., Disp: , Rfl:  .  dicyclomine (BENTYL) 10 MG capsule, Take 1 capsule (10 mg total) by mouth 3 (three) times daily., Disp: 90 capsule, Rfl: 2 .  furosemide (LASIX) 40 MG tablet, Take 40 mg by mouth.,  Disp: , Rfl:  .  glipiZIDE (GLUCOTROL) 5 MG tablet, Take 5 mg by mouth daily before breakfast., Disp: , Rfl:  .  Hypromellose (NATURAL BALANCE TEARS OP), Apply 1 drop to eye every morning. , Disp: , Rfl:  .  Linaclotide (LINZESS) 145 MCG CAPS capsule, Take 1 capsule (145 mcg total) by mouth daily before breakfast., Disp: 30 capsule, Rfl: 2 .  meclizine (ANTIVERT) 50 MG tablet, Take 0.5 tablets (25 mg total) by mouth 4 (four) times daily as needed for dizziness., Disp: 30 tablet, Rfl: 0 .  methocarbamol (ROBAXIN) 500 MG tablet, Take 500 mg by mouth 2 (two) times daily as needed for muscle spasms. , Disp: , Rfl:  .  NUCYNTA ER 100 MG TB12, Take 1 tablet by mouth 3 (three) times daily., Disp: , Rfl: 0 .  omeprazole (PRILOSEC) 20 MG capsule, Take 1 capsule (20 mg total) by mouth daily., Disp: 30 capsule, Rfl: 11 .  ondansetron (ZOFRAN ODT) 4 MG disintegrating tablet, Take 1 tablet (4 mg total) by mouth every 8 (eight) hours as needed for nausea or vomiting., Disp: 10 tablet, Rfl: 0 .  OxyCODONE (OXYCONTIN) 80 mg T12A 12 hr tablet, Take 80 mg by mouth 3 (three) times daily., Disp: , Rfl:  .  oxyCODONE-acetaminophen (PERCOCET) 10-325 MG per tablet, Take 1 tablet by mouth every 6 (six) hours as needed for  pain. , Disp: , Rfl:  .  Polyvinyl Alcohol-Povidone (CLEAR EYES ALL SEASONS) 5-6 MG/ML SOLN, Apply 2 drops to eye 3 (three) times daily as needed (drye eyes.). , Disp: , Rfl:  .  potassium chloride SA (K-DUR,KLOR-CON) 20 MEQ tablet, Take 1 tablet (20 mEq total) by mouth 2 (two) times daily., Disp: 60 tablet, Rfl: 6 .  prochlorperazine (COMPAZINE) 10 MG tablet, Take 1 tablet (10 mg total) by mouth every 6 (six) hours as needed for nausea or vomiting., Disp: 60 tablet, Rfl: 2 .  Sennosides (LAXATIVE PILLS PO), Take 1 tablet by mouth daily as needed (constipation)., Disp: , Rfl:  .  Tamsulosin HCl (FLOMAX) 0.4 MG CAPS, Take 0.4 mg by mouth daily. , Disp: , Rfl:  .  triamterene-hydrochlorothiazide (MAXZIDE-25)  37.5-25 MG per tablet, Take 1 tablet by mouth daily.  , Disp: , Rfl:  .  Vitamin D, Ergocalciferol, (DRISDOL) 50000 UNITS CAPS capsule, Take 50,000 Units by mouth every 7 (seven) days., Disp: , Rfl:  .  [DISCONTINUED] potassium chloride (KLOR-CON) 20 MEQ packet, Take 20 mEq by mouth daily. , Disp: , Rfl:  No current facility-administered medications for this visit.  Facility-Administered Medications Ordered in Other Visits:  .  pamidronate (AREDIA) 60 mg in sodium chloride 0.9 % 500 mL IVPB, 60 mg, Intravenous, Once, Volanda Napoleon, MD  Allergies:  Allergies  Allergen Reactions  . Iodine Anxiety and Rash    Didn't feel right "instructed not to take per MD--something with his port"    Past Medical History, Surgical history, Social history, and Family History were reviewed and updated.  Review of Systems: As above  Physical Exam:  height is 5\' 11"  (1.803 m) and weight is 201 lb (91.173 kg). His oral temperature is 98.4 F (36.9 C). His blood pressure is 159/71 and his pulse is 106. His respiration is 18.   His head and neck exam shows no ocular or oral lesions. He has no adenopathy in the neck. His thyroid is non-palpable. Lungs are clear. Cardiac exam regular rate and rhythm with no murmurs, rubs or bruits.. Abdomen is soft. His abdomen is slightly distended. He has good bowel sounds. There is no fluid wave. There is no palpable liver or spleen tip. Back exam shows no tenderness in the lumbar spine. There is no paravertebral muscle spasms. . He has slightly decreased range of motion of the lower back. Extremities shows no clubbing cyanosis or edema. He has decent strength in his legs. Skin exam shows no rashes. Neurological exam is nonfocal.  Lab Results  Component Value Date   WBC 8.3 05/15/2015   HGB 11.9* 05/15/2015   HCT 35.5* 05/15/2015   MCV 74* 05/15/2015   PLT 215 05/15/2015     Chemistry      Component Value Date/Time   NA 138 03/13/2015 0840   NA 139 02/14/2015 0409     NA 137 08/23/2012 2105   K 3.7 03/13/2015 0840   K 3.2* 02/14/2015 0409   K 4.0 08/23/2012 2105   CL 97* 03/13/2015 0840   CL 97* 02/14/2015 0409   CL 101 08/23/2012 2105   CO2 26 03/13/2015 0840   CO2 27 02/14/2015 0402   CO2 32 08/23/2012 2105   BUN 14 03/13/2015 0840   BUN 27* 02/14/2015 0409   BUN 23* 08/23/2012 2105   CREATININE 2.2* 03/13/2015 0840   CREATININE 2.20* 02/14/2015 0409   CREATININE 2.53* 08/23/2012 2105      Component Value Date/Time  CALCIUM 9.0 03/13/2015 0840   CALCIUM 8.6* 02/14/2015 0402   CALCIUM 8.7 08/23/2012 2105   ALKPHOS 65 03/13/2015 0840   ALKPHOS 53 11/14/2014 1647   ALKPHOS 101 08/23/2012 2105   AST 26 03/13/2015 0840   AST 21 11/14/2014 1647   AST 27 08/23/2012 2105   ALT 22 03/13/2015 0840   ALT 23 11/14/2014 1647   ALT 21 08/23/2012 2105   BILITOT 0.60 03/13/2015 0840   BILITOT 0.5 11/14/2014 1647   BILITOT 0.2 08/23/2012 2105         Impression and Plan: Mr. Akey is a 65 year old gentleman. He has multiple medical problems. I still do not think that myeloma is an issue. He has fibromyalgia. He has diabetes. He has low testosterone.  We will see about CT scan of his scalp. This might help [place] out with this discomfort. Again I cannot palpate anything in the area of the right parietal region.  He got his testosterone yesterday.  We have not had to give iron for quite a while. His ferritin elevations mostly an acute phase reactant. I go by the iron saturation as a measure of his iron levels.  We will plan to get back to see me in 2 more months.  Volanda Napoleon, MD 3/22/20178:36 AM

## 2015-05-15 NOTE — Addendum Note (Signed)
Addended by: Burney Gauze R on: 05/15/2015 01:16 PM   Modules accepted: Orders

## 2015-05-16 ENCOUNTER — Telehealth: Payer: Self-pay | Admitting: Nurse Practitioner

## 2015-05-16 LAB — IGG, IGA, IGM
IgA, Qn, Serum: 295 mg/dL (ref 61–437)
IgG, Qn, Serum: 1833 mg/dL — ABNORMAL HIGH (ref 700–1600)
IgM, Qn, Serum: 66 mg/dL (ref 20–172)

## 2015-05-16 LAB — TESTOSTERONE

## 2015-05-16 LAB — KAPPA/LAMBDA LIGHT CHAINS
IG LAMBDA FREE LIGHT CHAIN: 25.9 mg/L (ref 5.71–26.30)
Ig Kappa Free Light Chain: 43.91 mg/L — ABNORMAL HIGH (ref 3.30–19.40)
KAPPA/LAMBDA FLC RATIO: 1.7 — AB (ref 0.26–1.65)

## 2015-05-16 NOTE — Telephone Encounter (Addendum)
Pt verbalized understanding and appreciation.  ----- Message from Volanda Napoleon, MD sent at 05/15/2015  5:12 PM EDT ----- Please call and tell him that the CT scan does not show anything on the scalp or in the skull that looks like it would be causing pain.

## 2015-05-19 ENCOUNTER — Emergency Department (HOSPITAL_COMMUNITY): Payer: Medicare Other

## 2015-05-19 ENCOUNTER — Encounter (HOSPITAL_COMMUNITY): Payer: Self-pay | Admitting: Emergency Medicine

## 2015-05-19 ENCOUNTER — Emergency Department (HOSPITAL_COMMUNITY)
Admission: EM | Admit: 2015-05-19 | Discharge: 2015-05-19 | Disposition: A | Discharge status: Home / Self Care | Payer: Medicare Other | Attending: Emergency Medicine | Admitting: Emergency Medicine

## 2015-05-19 DIAGNOSIS — Z862 Personal history of diseases of the blood and blood-forming organs and certain disorders involving the immune mechanism: Secondary | ICD-10-CM | POA: Diagnosis not present

## 2015-05-19 DIAGNOSIS — M21372 Foot drop, left foot: Secondary | ICD-10-CM | POA: Insufficient documentation

## 2015-05-19 DIAGNOSIS — E119 Type 2 diabetes mellitus without complications: Secondary | ICD-10-CM | POA: Insufficient documentation

## 2015-05-19 DIAGNOSIS — I129 Hypertensive chronic kidney disease with stage 1 through stage 4 chronic kidney disease, or unspecified chronic kidney disease: Secondary | ICD-10-CM | POA: Insufficient documentation

## 2015-05-19 DIAGNOSIS — R200 Anesthesia of skin: Secondary | ICD-10-CM | POA: Diagnosis not present

## 2015-05-19 DIAGNOSIS — R600 Localized edema: Secondary | ICD-10-CM | POA: Insufficient documentation

## 2015-05-19 DIAGNOSIS — R51 Headache: Secondary | ICD-10-CM | POA: Insufficient documentation

## 2015-05-19 DIAGNOSIS — Z79899 Other long term (current) drug therapy: Secondary | ICD-10-CM | POA: Insufficient documentation

## 2015-05-19 DIAGNOSIS — K5900 Constipation, unspecified: Secondary | ICD-10-CM | POA: Insufficient documentation

## 2015-05-19 DIAGNOSIS — E785 Hyperlipidemia, unspecified: Secondary | ICD-10-CM | POA: Diagnosis not present

## 2015-05-19 DIAGNOSIS — N189 Chronic kidney disease, unspecified: Secondary | ICD-10-CM | POA: Diagnosis not present

## 2015-05-19 DIAGNOSIS — F419 Anxiety disorder, unspecified: Secondary | ICD-10-CM | POA: Insufficient documentation

## 2015-05-19 DIAGNOSIS — Z8619 Personal history of other infectious and parasitic diseases: Secondary | ICD-10-CM | POA: Diagnosis not present

## 2015-05-19 DIAGNOSIS — Z8579 Personal history of other malignant neoplasms of lymphoid, hematopoietic and related tissues: Secondary | ICD-10-CM | POA: Diagnosis not present

## 2015-05-19 DIAGNOSIS — R1084 Generalized abdominal pain: Secondary | ICD-10-CM | POA: Diagnosis not present

## 2015-05-19 DIAGNOSIS — M1990 Unspecified osteoarthritis, unspecified site: Secondary | ICD-10-CM | POA: Insufficient documentation

## 2015-05-19 DIAGNOSIS — R519 Headache, unspecified: Secondary | ICD-10-CM

## 2015-05-19 LAB — I-STAT CHEM 8, ED
BUN: 14 mg/dL (ref 6–20)
CALCIUM ION: 1.02 mmol/L — AB (ref 1.13–1.30)
CHLORIDE: 102 mmol/L (ref 101–111)
Creatinine, Ser: 2 mg/dL — ABNORMAL HIGH (ref 0.61–1.24)
Glucose, Bld: 134 mg/dL — ABNORMAL HIGH (ref 65–99)
HEMATOCRIT: 41 % (ref 39.0–52.0)
Hemoglobin: 13.9 g/dL (ref 13.0–17.0)
POTASSIUM: 3.3 mmol/L — AB (ref 3.5–5.1)
SODIUM: 142 mmol/L (ref 135–145)
TCO2: 26 mmol/L (ref 0–100)

## 2015-05-19 LAB — CBC
HCT: 35.7 % — ABNORMAL LOW (ref 39.0–52.0)
Hemoglobin: 11.6 g/dL — ABNORMAL LOW (ref 13.0–17.0)
MCH: 24.2 pg — AB (ref 26.0–34.0)
MCHC: 32.5 g/dL (ref 30.0–36.0)
MCV: 74.5 fL — ABNORMAL LOW (ref 78.0–100.0)
Platelets: 235 10*3/uL (ref 150–400)
RBC: 4.79 MIL/uL (ref 4.22–5.81)
RDW: 16.9 % — ABNORMAL HIGH (ref 11.5–15.5)
WBC: 7.8 10*3/uL (ref 4.0–10.5)

## 2015-05-19 LAB — COMPREHENSIVE METABOLIC PANEL
ALBUMIN: 3.7 g/dL (ref 3.5–5.0)
ALK PHOS: 71 U/L (ref 38–126)
ALT: 18 U/L (ref 17–63)
ANION GAP: 10 (ref 5–15)
AST: 18 U/L (ref 15–41)
BUN: 12 mg/dL (ref 6–20)
CO2: 25 mmol/L (ref 22–32)
Calcium: 8.3 mg/dL — ABNORMAL LOW (ref 8.9–10.3)
Chloride: 105 mmol/L (ref 101–111)
Creatinine, Ser: 2.1 mg/dL — ABNORMAL HIGH (ref 0.61–1.24)
GFR calc Af Amer: 36 mL/min — ABNORMAL LOW (ref >60)
GFR calc non Af Amer: 31 mL/min — ABNORMAL LOW (ref >60)
GLUCOSE: 139 mg/dL — AB (ref 65–99)
Potassium: 3.3 mmol/L — ABNORMAL LOW (ref 3.5–5.1)
SODIUM: 140 mmol/L (ref 135–145)
Total Bilirubin: 0.8 mg/dL (ref 0.3–1.2)
Total Protein: 7.3 g/dL (ref 6.5–8.1)

## 2015-05-19 LAB — URINE MICROSCOPIC-ADD ON: Bacteria, UA: NONE SEEN

## 2015-05-19 LAB — URINALYSIS, ROUTINE W REFLEX MICROSCOPIC
BILIRUBIN URINE: NEGATIVE
Glucose, UA: NEGATIVE mg/dL
Hgb urine dipstick: NEGATIVE
KETONES UR: NEGATIVE mg/dL
Leukocytes, UA: NEGATIVE
NITRITE: NEGATIVE
PROTEIN: 30 mg/dL — AB
SPECIFIC GRAVITY, URINE: 1.01 (ref 1.005–1.030)
pH: 6.5 (ref 5.0–8.0)

## 2015-05-19 LAB — BRAIN NATRIURETIC PEPTIDE: B Natriuretic Peptide: 15.1 pg/mL (ref 0.0–100.0)

## 2015-05-19 MED ORDER — HEPARIN SOD (PORK) LOCK FLUSH 100 UNIT/ML IV SOLN
500.0000 [IU] | Freq: Once | INTRAVENOUS | Status: DC
  Filled 2015-05-19: qty 5

## 2015-05-19 MED ORDER — DIPHENHYDRAMINE HCL 50 MG/ML IJ SOLN
25.0000 mg | Freq: Once | INTRAMUSCULAR | Status: AC
  Administered 2015-05-19: 25 mg via INTRAVENOUS
  Filled 2015-05-19: qty 1

## 2015-05-19 MED ORDER — METOCLOPRAMIDE HCL 5 MG/ML IJ SOLN
10.0000 mg | Freq: Once | INTRAMUSCULAR | Status: AC
  Administered 2015-05-19: 10 mg via INTRAVENOUS
  Filled 2015-05-19: qty 2

## 2015-05-19 NOTE — ED Provider Notes (Signed)
CSN: [id number]     Arrival date & time 05/19/15  0356 History   First MD Initiated Contact with Patient 05/19/15 0700     Chief Complaint  Patient presents with  . Headache  . Leg Swelling     (Consider location/radiation/quality/duration/timing/severity/associated sxs/prior Treatment) HPI Comments: 65yo M w/ extensive PMH including multiple myeloma on chemo, T2DM, HTN, CKD, diabetic neuropathy who presents with headache, leg edema, and leg weakness. The patient states that he has had 2 months of bilateral lower extremity edema which has gotten worse recently. He also reports foot numbness during this period of time. He states that he has a hard time walking because when he stands up he feels like he can't move his feet. He also endorses a 3 week history of headache which she states started when he had a sore bump on his right side of his scalp. The bump went away but the pain in this area has continued. He has not taken any medication for his headache, nothing seems to make it better or worse. He denies any changes in his vision. He has chronic left hand weakness from a previous elbow injury. No fevers or vomiting but he does endorse nausea, chronic abdominal pain, sweats, and chills.  Patient is a 65 y.o. male presenting with headaches. The history is provided by the patient.  Headache   Past Medical History  Diagnosis Date  . Multiple myeloma ([place])   . Diabetes mellitus without complication ([place])   . Multiple myeloma   . Hypertension   . Renal insufficiency   . Diverticulosis   . Hemorrhoids   . Type 2 diabetes mellitus ([place])   . Anxiety disorder   . Urinary retention   . Anemia   . Diabetic neuropathy ([place])   . Constipation   . Herpes zoster   . Arthritis   . Hyperlipidemia   . Myeloma ([place]) 12/31/2010  . Arthralgia of multiple joints 12/31/2010  . Anemia, iron deficiency 03/12/2011  . 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 Hypotestosteronism 03/17/2012   Past Surgical History   Procedure Laterality Date  . Knee surgery    . Knee arthroscopy      left  . Portacath placement    . Cataract extraction      bi-lateral  . Transurethral resection of prostate  01/2011    Dr. at [facility] in [place]  . Colonoscopy    . Esophagogastroduodenoscopy    . Colonoscopy  07/13/2011    Procedure: COLONOSCOPY;  Surgeon: Ladene Artist, MD,FACG;  Location: [facility];  Service: Endoscopy;  Laterality: N/A;   Family History  Problem Relation Age of Onset  . Breast cancer Sister   . Breast cancer Mother   . Diabetes Sister   . Bone cancer Father   . Colon cancer Neg Hx   . Heart disease Sister   . Heart disease Mother   . Heart disease Daughter   . Throat cancer Brother   . Stomach cancer Brother    Social History  Substance Use Topics  . Smoking status: Never Smoker   . Smokeless tobacco: Never Used     Comment: never used tobacco  . Alcohol Use: No    Review of Systems  Neurological: Positive for headaches.   10 Systems reviewed and are negative for acute change except as noted in the HPI.    Allergies  Iodine  Home Medications   Prior to Admission medications   Medication Sig Start Date  End Date Taking? Authorizing Provider  amLODipine (NORVASC) 10 MG tablet Take 10 mg by mouth daily.   Yes Historical Provider, MD  CARAFATE 1 GM/10ML suspension Take 1 g by mouth 4 (four) times daily.  07/28/11  Yes Historical Provider, MD  diazepam (VALIUM) 5 MG tablet Take 5 mg by mouth every 4 (four) hours as needed for anxiety.   Yes Historical Provider, MD  dicyclomine (BENTYL) 10 MG capsule Take 1 capsule (10 mg total) by mouth 3 (three) times daily. 11/16/14  Yes Jessica D Zehr, PA-C  furosemide (LASIX) 40 MG tablet Take 40 mg by mouth.   Yes Historical Provider, MD  glipiZIDE (GLUCOTROL) 5 MG tablet Take 5 mg by mouth daily before breakfast.   Yes Historical Provider, MD  Hypromellose (NATURAL BALANCE TEARS OP) Apply 1 drop to eye every morning.    Yes  Historical Provider, MD  Linaclotide Rolan Lipa) 145 MCG CAPS capsule Take 1 capsule (145 mcg total) by mouth daily before breakfast. 11/16/14  Yes Laban Emperor Zehr, PA-C  meclizine (ANTIVERT) 50 MG tablet Take 0.5 tablets (25 mg total) by mouth 4 (four) times daily as needed for dizziness. 12/13/11  Yes Virgel Manifold, MD  methocarbamol (ROBAXIN) 500 MG tablet Take 500 mg by mouth 2 (two) times daily as needed for muscle spasms.    Yes Historical Provider, MD  NUCYNTA ER 100 MG TB12 Take 1 tablet by mouth 3 (three) times daily. 01/16/15  Yes Historical Provider, MD  omeprazole (PRILOSEC) 20 MG capsule Take 1 capsule (20 mg total) by mouth daily. 08/30/13  Yes Ladene Artist, MD  ondansetron (ZOFRAN ODT) 4 MG disintegrating tablet Take 1 tablet (4 mg total) by mouth every 8 (eight) hours as needed for nausea or vomiting. 09/20/14  Yes Alvina Chou, PA-C  OxyCODONE (OXYCONTIN) 80 mg T12A 12 hr tablet Take 80 mg by mouth 3 (three) times daily.   Yes Historical Provider, MD  oxyCODONE-acetaminophen (PERCOCET) 10-325 MG per tablet Take 1 tablet by mouth every 6 (six) hours as needed for pain.    Yes Historical Provider, MD  Polyvinyl Alcohol-Povidone (CLEAR EYES ALL SEASONS) 5-6 MG/ML SOLN Apply 2 drops to eye 3 (three) times daily as needed (drye eyes.).    Yes Historical Provider, MD  potassium chloride SA (K-DUR,KLOR-CON) 20 MEQ tablet Take 1 tablet (20 mEq total) by mouth 2 (two) times daily. 11/07/14  Yes Volanda Napoleon, MD  prochlorperazine (COMPAZINE) 10 MG tablet Take 1 tablet (10 mg total) by mouth every 6 (six) hours as needed for nausea or vomiting. 04/18/14  Yes Volanda Napoleon, MD  Sennosides (LAXATIVE PILLS PO) Take 1 tablet by mouth daily as needed (constipation).   Yes Historical Provider, MD  Tamsulosin HCl (FLOMAX) 0.4 MG CAPS Take 0.4 mg by mouth daily.    Yes Historical Provider, MD  triamterene-hydrochlorothiazide (MAXZIDE-25) 37.5-25 MG per tablet Take 1 tablet by mouth daily.     Yes  Historical Provider, MD  Vitamin D, Ergocalciferol, (DRISDOL) 50000 UNITS CAPS capsule Take 50,000 Units by mouth every 7 (seven) days.   Yes Historical Provider, MD   BP 163/71 mmHg  Pulse 93  Temp(Src) 98 F (36.7 C) (Oral)  Resp 15  SpO2 97% Physical Exam  Constitutional: He is oriented to person, place, and time. He appears well-developed and well-nourished. No distress.  HENT:  Head: Normocephalic and atraumatic.    Moist mucous membranes  Eyes: Conjunctivae are normal. Pupils are equal, round, and reactive to light.  Neck:  Neck supple.  Cardiovascular: Normal rate, regular rhythm and normal heart sounds.   No murmur heard. Pulmonary/Chest: Effort normal and breath sounds normal.  Port R upper chest  Abdominal: Soft. Bowel sounds are normal. He exhibits no distension. There is tenderness.  Generalized TTP, no rebound or guarding, no peritonitis  Musculoskeletal: He exhibits edema.  1+ BLE edema; left hand atrophy; 3/5 strength LUE, 5/5 strength RUE, scar over L elbow; 5/5 strength RLE, 5/5 strength hip flexion LLE but 4/5 dorsi/plantar flexion at left foot; decreased sensation b/l feet L>R; decreased sensation L hand compared to R  Neurological: He is alert and oriented to person, place, and time. No cranial nerve deficit.  Fluent speech; 2+ DTR LLE, 1+ DTR RLE; 1+ DTR BUE  Skin: Skin is warm and dry. No rash noted. No erythema.  Psychiatric: He has a normal mood and affect. Judgment normal.  Nursing note and vitals reviewed.   ED Course  Procedures (including critical care time) Labs Review Labs Reviewed  CBC - Abnormal; Notable for the following:    Hemoglobin 11.6 (*)    HCT 35.7 (*)    MCV 74.5 (*)    MCH 24.2 (*)    RDW 16.9 (*)    All other components within normal limits  COMPREHENSIVE METABOLIC PANEL - Abnormal; Notable for the following:    Potassium 3.3 (*)    Glucose, Bld 139 (*)    Creatinine, Ser 2.10 (*)    Calcium 8.3 (*)    GFR calc non Af Amer 31  (*)    GFR calc Af Amer 36 (*)    All other components within normal limits  I-STAT CHEM 8, ED - Abnormal; Notable for the following:    Potassium 3.3 (*)    Creatinine, Ser 2.00 (*)    Glucose, Bld 134 (*)    Calcium, Ion 1.02 (*)    All other components within normal limits  BRAIN NATRIURETIC PEPTIDE  URINALYSIS, ROUTINE W REFLEX MICROSCOPIC (NOT AT [facility])    Imaging Review Mr Brain Wo Contrast (neuro Protocol)  05/19/2015  CLINICAL DATA:  65 year old male with headaches for 2 months. Left foot drop and left hand numbness. Initial encounter. Personal history of multiple myeloma. EXAM: MRI HEAD WITHOUT CONTRAST TECHNIQUE: Multiplanar, multiecho pulse sequences of the brain and surrounding structures were obtained without intravenous contrast. COMPARISON:  Head CT without contrast 05/15/2015. Brain MRI 06/20/2011. FINDINGS: Major intracranial vascular flow voids are stable. Cerebral volume has not significantly changed since 2013. No restricted diffusion to suggest acute infarction. No midline shift, mass effect, evidence of mass lesion, ventriculomegaly, extra-axial collection or acute intracranial hemorrhage. Cervicomedullary junction and pituitary are within normal limits. Pearline Cables and white matter signal is within normal limits for age throughout the brain. No encephalomalacia or chronic cerebral blood products identified. Visible internal auditory structures appear normal. Mastoids are clear. Paranasal sinuses are clear. Orbit and scalp soft tissues are stable. Skull bone marrow signal is stable and within normal limits. Mildly more heterogeneous marrow signal in the visualized upper cervical spine is stable with no suspicious marrow lesion. IMPRESSION: 1. Stable and normal for age noncontrast MRI appearance of the brain. 2. Stable bone marrow signal since 2013, no evidence of multiple myeloma involvement of the skull. Electronically Signed   By: Genevie Ann M.D.   On: 05/19/2015 09:02   Mr Lumbar Spine  Wo Contrast  05/19/2015  CLINICAL DATA:  65 year old male with left foot drop. Multiple myeloma. Subsequent encounter. EXAM: MRI LUMBAR  SPINE WITHOUT CONTRAST TECHNIQUE: Multiplanar, multisequence MR imaging of the lumbar spine was performed. No intravenous contrast was administered. COMPARISON:  CT Abdomen and Pelvis 11/14/2014. Lumbar MRI 10/02/2013. FINDINGS: Same numbering system as in 2015 designating normal lumbar segmentation. Chronic diffusely abnormal marrow signal in the visualized lower thoracic spine, lumbar spine, sacrum, and pelvis. No pathologic fracture identified. No definite marrow edema. Stable vertebral height and alignment. Visualized lower thoracic spinal cord is normal with conus medularis at L1. Noncontrast cauda equina nerve roots appear normal. Stable visualized abdominal viscera. Moderate distension of the urinary bladder, similar to the prior exam. As before, intervertebral disc spaces from T11-T12 -L1-L2 are normal. Normal disc hydration noted at L2-L3 and L3-L4, but chronic disc bulging at those levels. Superimposed mild facet hypertrophy at those levels is stable, and there remains no spinal or lateral recess stenosis. No significant foraminal stenosis. L4-L5: Chronic disc desiccation and right eccentric disc bulge with superimposed right foraminal disc protrusion (series 700, image 30). Stable mild facet hypertrophy. No spinal or lateral recess stenosis. Moderate right L4 foraminal stenosis appears increased. L5-S1: Chronic disc desiccation and left eccentric disc bulge chronic small left subarticular disc protrusion (series 400, image 9) has not significantly changed. This abuts the descending left S1 nerve roots in the lateral recess as before, without significant nerve root displacement. Mild left L5 foraminal stenosis also appear stable. Mild facet hypertrophy with no spinal stenosis. IMPRESSION: 1. Chronic diffuse abnormal marrow signal from the lower thoracic spine to the  pelvis is stable since 2015. This is nonspecific, with considerations including chronic multiple myeloma and renal osteodystrophy. No pathologic fracture or marrow edema identified. 2. Mild for age lumbar spine degeneration with no spinal stenosis. 3. Chronic right foraminal disc protrusion at L4-L5 affecting the exiting right L4 nerve. Stable chronic small left lateral recess disc protrusion at L5-S1 which could be a source for left S1 radiculitis. Mild left L5 foraminal stenosis also appears stable. 4. Moderate distension of the urinary bladder. Electronically Signed   By: Genevie Ann M.D.   On: 05/19/2015 09:11   I have personally reviewed and evaluated these lab results as part of my medical decision-making.   EKG Interpretation None     Medications  diphenhydrAMINE (BENADRYL) injection 25 mg (25 mg Intravenous Given 05/19/15 0912)  metoCLOPramide (REGLAN) injection 10 mg (10 mg Intravenous Given 05/19/15 0912)    MDM   Final diagnoses:  Left foot drop  Bilateral edema of lower extremity  Nonintractable headache, unspecified chronicity pattern, unspecified headache type  Numbness of foot   Patient with multiple myeloma and diabetic neuropathy who presents with 2 months of lower extremity edema which is worse recently, lower ext weakness, and headaches for the past 3 weeks mainly located on the right side of his scalp. On exam, the patient was well-appearing. Vital signs notable for hypertension. He had weakness of left foot dorsiflexion and plantarflexion, chronic weakness of his left hand, decreased sensation on left hand and foot compared to the right side. Cranial nerves intact. He had tenderness over his right scalp in the area where he stated he previously had a lump. He had a head CT earlier this week from his oncologist which was unremarkable. Obtained above lab work. DDx for his neurologic sx is broad and includes intracranial pathology such as stroke or brain lesion, as well as lower  spinal process causing foot drop. Therefore obtained MRI of brain and lumbar spine. Gave the patient Benadryl and Reglan for his headache.  Labwork shows CBC at his baseline, creatinine 2.1 which is at baseline, and normal BNP. No evidence of new liver or kidney failure to explain the patient's edema. Given chronicity and b/l nature of edema, I doubt acute process. Regarding headache, the fact that it started with tenderness on the side of his head and his MRI shows no skull involvement of MM and no intracranial acute process, I feel he is stable for discharge. His MRI lumbar spine shows chronic changes but no acute findings. Given his several month chronicity of symptoms, I doubt acute process. I discussed results with patient and his daughter and instructed to follow-up with oncologist this week. I extensively reviewed return precautions including the development of any other neurologic symptoms. I also counseled the patient on diabetic foot care. Patient voiced understanding and was discharged in satisfactory condition.  Sharlett Iles, MD 05/19/15 [phone]

## 2015-05-19 NOTE — Discharge Instructions (Signed)
Edema °Edema is an abnormal buildup of fluids in your body tissues. Edema is somewhat dependent on gravity to pull the fluid to the lowest place in your body. That makes the condition more common in the legs and thighs (lower extremities). Painless swelling of the feet and ankles is common and becomes more likely as you get older. It is also common in looser tissues, like around your eyes.  °When the affected area is squeezed, the fluid may move out of that spot and leave a dent for a few moments. This dent is called pitting.  °CAUSES  °There are many possible causes of edema. Eating too much salt and being on your feet or sitting for a long time can cause edema in your legs and ankles. Hot weather may make edema worse. Common medical causes of edema include: °· Heart failure. °· Liver disease. °· Kidney disease. °· Weak blood vessels in your legs. °· Cancer. °· An injury. °· Pregnancy. °· Some medications. °· Obesity.  °SYMPTOMS  °Edema is usually painless. Your skin may look swollen or shiny.  °DIAGNOSIS  °Your health care provider may be able to diagnose edema by asking about your medical history and doing a physical exam. You may need to have tests such as X-rays, an electrocardiogram, or blood tests to check for medical conditions that may cause edema.  °TREATMENT  °Edema treatment depends on the cause. If you have heart, liver, or kidney disease, you need the treatment appropriate for these conditions. General treatment may include: °· Elevation of the affected body part above the level of your heart. °· Compression of the affected body part. Pressure from elastic bandages or support stockings squeezes the tissues and forces fluid back into the blood vessels. This keeps fluid from entering the tissues. °· Restriction of fluid and salt intake. °· Use of a water pill (diuretic). These medications are appropriate only for some types of edema. They pull fluid out of your body and make you urinate more often. This  gets rid of fluid and reduces swelling, but diuretics can have side effects. Only use diuretics as directed by your health care provider. °HOME CARE INSTRUCTIONS  °· Keep the affected body part above the level of your heart when you are lying down.   °· Do not sit still or stand for prolonged periods.   °· Do not put anything directly under your knees when lying down. °· Do not wear constricting clothing or garters on your upper legs.   °· Exercise your legs to work the fluid back into your blood vessels. This may help the swelling go down.   °· Wear elastic bandages or support stockings to reduce ankle swelling as directed by your health care provider.   °· Eat a low-salt diet to reduce fluid if your health care provider recommends it.   °· Only take medicines as directed by your health care provider.  °SEEK MEDICAL CARE IF:  °· Your edema is not responding to treatment. °· You have heart, liver, or kidney disease and notice symptoms of edema. °· You have edema in your legs that does not improve after elevating them.   °· You have sudden and unexplained weight gain. °SEEK IMMEDIATE MEDICAL CARE IF:  °· You develop shortness of breath or chest pain.   °· You cannot breathe when you lie down. °· You develop pain, redness, or warmth in the swollen areas.   °· You have heart, liver, or kidney disease and suddenly get edema. °· You have a fever and your symptoms suddenly get worse. °MAKE SURE YOU:  °·   Understand these instructions. °· Will watch your condition. °· Will get help right away if you are not doing well or get worse. °  °This information is not intended to replace advice given to you by your health care provider. Make sure you discuss any questions you have with your health care provider. °  °Document Released: 02/09/2005 Document Revised: 03/02/2014 Document Reviewed: 12/02/2012 °Elsevier Interactive Patient Education ©2016 Elsevier Inc. ° °

## 2015-05-19 NOTE — ED Notes (Signed)
Pt back from MRI 

## 2015-05-19 NOTE — ED Notes (Signed)
Patient with headache that has been going on for about a week.  Patient states that he went to his cancer doctor on Wednesday and he is feeling out of sorts.  He feels like he might have to throw up.

## 2015-05-20 LAB — PROTEIN ELECTROPHORESIS, SERUM, WITH REFLEX
A/G Ratio: 0.9 (ref 0.7–1.7)
ALBUMIN: 3.6 g/dL (ref 2.9–4.4)
ALPHA 1: 0.2 g/dL (ref 0.0–0.4)
Alpha 2: 1 g/dL (ref 0.4–1.0)
BETA: 1.1 g/dL (ref 0.7–1.3)
GAMMA GLOBULIN: 1.9 g/dL — AB (ref 0.4–1.8)
Globulin, Total: 4.2 g/dL — ABNORMAL HIGH (ref 2.2–3.9)
Interpretation(See Below): 0
M-Spike, %: 1.2 g/dL — ABNORMAL HIGH
Total Protein: 7.8 g/dL (ref 6.0–8.5)

## 2015-05-21 ENCOUNTER — Emergency Department (HOSPITAL_COMMUNITY): Payer: Medicare Other

## 2015-05-21 ENCOUNTER — Emergency Department (HOSPITAL_COMMUNITY)
Admission: EM | Admit: 2015-05-21 | Discharge: 2015-05-21 | Disposition: A | Discharge status: Home / Self Care | Payer: Medicare Other | Attending: Emergency Medicine | Admitting: Emergency Medicine

## 2015-05-21 ENCOUNTER — Encounter (HOSPITAL_COMMUNITY): Payer: Self-pay | Admitting: Emergency Medicine

## 2015-05-21 DIAGNOSIS — F419 Anxiety disorder, unspecified: Secondary | ICD-10-CM | POA: Insufficient documentation

## 2015-05-21 DIAGNOSIS — K5900 Constipation, unspecified: Secondary | ICD-10-CM | POA: Insufficient documentation

## 2015-05-21 DIAGNOSIS — R55 Syncope and collapse: Secondary | ICD-10-CM | POA: Insufficient documentation

## 2015-05-21 DIAGNOSIS — R197 Diarrhea, unspecified: Secondary | ICD-10-CM | POA: Insufficient documentation

## 2015-05-21 DIAGNOSIS — M1990 Unspecified osteoarthritis, unspecified site: Secondary | ICD-10-CM | POA: Diagnosis not present

## 2015-05-21 DIAGNOSIS — Z8579 Personal history of other malignant neoplasms of lymphoid, hematopoietic and related tissues: Secondary | ICD-10-CM | POA: Insufficient documentation

## 2015-05-21 DIAGNOSIS — I10 Essential (primary) hypertension: Secondary | ICD-10-CM | POA: Diagnosis not present

## 2015-05-21 DIAGNOSIS — Z862 Personal history of diseases of the blood and blood-forming organs and certain disorders involving the immune mechanism: Secondary | ICD-10-CM | POA: Insufficient documentation

## 2015-05-21 DIAGNOSIS — R531 Weakness: Secondary | ICD-10-CM | POA: Diagnosis not present

## 2015-05-21 DIAGNOSIS — E1140 Type 2 diabetes mellitus with diabetic neuropathy, unspecified: Secondary | ICD-10-CM | POA: Diagnosis not present

## 2015-05-21 DIAGNOSIS — Z79899 Other long term (current) drug therapy: Secondary | ICD-10-CM | POA: Diagnosis not present

## 2015-05-21 DIAGNOSIS — R109 Unspecified abdominal pain: Secondary | ICD-10-CM | POA: Insufficient documentation

## 2015-05-21 DIAGNOSIS — R112 Nausea with vomiting, unspecified: Secondary | ICD-10-CM | POA: Insufficient documentation

## 2015-05-21 DIAGNOSIS — Z8619 Personal history of other infectious and parasitic diseases: Secondary | ICD-10-CM | POA: Insufficient documentation

## 2015-05-21 DIAGNOSIS — Z7984 Long term (current) use of oral hypoglycemic drugs: Secondary | ICD-10-CM | POA: Diagnosis not present

## 2015-05-21 LAB — URINALYSIS, ROUTINE W REFLEX MICROSCOPIC
Bilirubin Urine: NEGATIVE
Glucose, UA: NEGATIVE mg/dL
Hgb urine dipstick: NEGATIVE
KETONES UR: NEGATIVE mg/dL
LEUKOCYTES UA: NEGATIVE
NITRITE: NEGATIVE
PH: 6 (ref 5.0–8.0)
PROTEIN: 30 mg/dL — AB
Specific Gravity, Urine: 1.013 (ref 1.005–1.030)

## 2015-05-21 LAB — BASIC METABOLIC PANEL
ANION GAP: 10 (ref 5–15)
BUN: 17 mg/dL (ref 6–20)
CO2: 27 mmol/L (ref 22–32)
Calcium: 8.8 mg/dL — ABNORMAL LOW (ref 8.9–10.3)
Chloride: 106 mmol/L (ref 101–111)
Creatinine, Ser: 2.22 mg/dL — ABNORMAL HIGH (ref 0.61–1.24)
GFR, EST AFRICAN AMERICAN: 34 mL/min — AB (ref >60)
GFR, EST NON AFRICAN AMERICAN: 29 mL/min — AB (ref >60)
GLUCOSE: 141 mg/dL — AB (ref 65–99)
POTASSIUM: 3.7 mmol/L (ref 3.5–5.1)
Sodium: 143 mmol/L (ref 135–145)

## 2015-05-21 LAB — CBC WITH DIFFERENTIAL/PLATELET
Basophils Absolute: 0.1 10*3/uL (ref 0.0–0.1)
Basophils Relative: 1 %
EOS PCT: 3 %
Eosinophils Absolute: 0.2 10*3/uL (ref 0.0–0.7)
HCT: 35.9 % — ABNORMAL LOW (ref 39.0–52.0)
Hemoglobin: 12 g/dL — ABNORMAL LOW (ref 13.0–17.0)
LYMPHS ABS: 1.9 10*3/uL (ref 0.7–4.0)
LYMPHS PCT: 29 %
MCH: 24.4 pg — AB (ref 26.0–34.0)
MCHC: 33.4 g/dL (ref 30.0–36.0)
MCV: 73 fL — AB (ref 78.0–100.0)
MONO ABS: 0.6 10*3/uL (ref 0.1–1.0)
MONOS PCT: 9 %
Neutro Abs: 4 10*3/uL (ref 1.7–7.7)
Neutrophils Relative %: 58 %
PLATELETS: 243 10*3/uL (ref 150–400)
RBC: 4.92 MIL/uL (ref 4.22–5.81)
RDW: 17 % — AB (ref 11.5–15.5)
WBC: 6.8 10*3/uL (ref 4.0–10.5)

## 2015-05-21 LAB — URINE MICROSCOPIC-ADD ON: Bacteria, UA: NONE SEEN

## 2015-05-21 LAB — MAGNESIUM: MAGNESIUM: 2.1 mg/dL (ref 1.7–2.4)

## 2015-05-21 LAB — CBG MONITORING, ED: Glucose-Capillary: 94 mg/dL (ref 65–99)

## 2015-05-21 MED ORDER — DEXTROSE 5 % IV SOLN: 2.0000 g | Freq: Once | INTRAVENOUS | Status: DC

## 2015-05-21 MED ORDER — HYDROMORPHONE HCL 1 MG/ML IJ SOLN
1.0000 mg | Freq: Once | INTRAMUSCULAR | Status: AC
  Administered 2015-05-21: 1 mg via INTRAVENOUS
  Filled 2015-05-21: qty 1

## 2015-05-21 MED ORDER — AZITHROMYCIN 250 MG PO TABS
500.0000 mg | ORAL_TABLET | Freq: Once | ORAL | Status: DC
Start: 2015-05-21 — End: 2015-05-21

## 2015-05-21 NOTE — ED Provider Notes (Signed)
CSN: [id number]     Arrival date & time 05/21/15  0143 History  By signing my name below, I, Rohini Rajnarayanan, attest that this documentation has been prepared under the direction and in the presence of Everlene Balls, MD Electronically Signed: Evonnie Dawes, ED Scribe 05/21/2015 at 3:25 AM.   Chief Complaint  Patient presents with  . Weakness   The history is provided by the patient. No language interpreter was used.    HPI Comments: [name] is a 65 y.o. male with a pmhx of multiple myeloma, DM, HTN, and diverticulosis, who presents to the Emergency Department complaining of L>R weakness, with associated abdominal pain, nausea, vomiting, diarrhea, and presyncopal sx x3 days. Pt has not had any episodes of complete syncope. Pt also reports some abdominal bloating or swelling. He has had a hx of hernia in the past. Pt also feels like "something is crawling" in both ears. Pt denies any fever or sick contacts. Pt is currently undergoing radiation for his multiple myeloma. Pt recently had a head CT and an MRI that were both negative.   Past Medical History  Diagnosis Date  . Multiple myeloma ([place])   . Diabetes mellitus without complication ([place])   . Multiple myeloma   . Hypertension   . Renal insufficiency   . Diverticulosis   . Hemorrhoids   . Type 2 diabetes mellitus ([place])   . Anxiety disorder   . Urinary retention   . Anemia   . Diabetic neuropathy ([place])   . Constipation   . Herpes zoster   . Arthritis   . Hyperlipidemia   . Myeloma ([place]) 12/31/2010  . Arthralgia of multiple joints 12/31/2010  . Anemia, iron deficiency 03/12/2011  . 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 Hypotestosteronism 03/17/2012   Past Surgical History  Procedure Laterality Date  . Knee surgery    . Knee arthroscopy      left  . Portacath placement    . Cataract extraction      bi-lateral  . Transurethral resection of prostate  01/2011    Dr. at [facility] in [place]  . Colonoscopy    .  Esophagogastroduodenoscopy    . Colonoscopy  07/13/2011    Procedure: COLONOSCOPY;  Surgeon: Ladene Artist, MD,FACG;  Location: [facility] ENDOSCOPY;  Service: Endoscopy;  Laterality: N/A;   Family History  Problem Relation Age of Onset  . Breast cancer Sister   . Breast cancer Mother   . Diabetes Sister   . Bone cancer Father   . Colon cancer Neg Hx   . Heart disease Sister   . Heart disease Mother   . Heart disease Daughter   . Throat cancer Brother   . Stomach cancer Brother    Social History  Substance Use Topics  . Smoking status: Never Smoker   . Smokeless tobacco: Never Used     Comment: never used tobacco  . Alcohol Use: No    Review of Systems 10 Systems reviewed and all are negative for acute change except as noted in the HPI.  Allergies  Iodine  Home Medications   Prior to Admission medications   Medication Sig Start Date End Date Taking? Authorizing Provider  amLODipine (NORVASC) 10 MG tablet Take 10 mg by mouth daily.   Yes Historical Provider, MD  CARAFATE 1 GM/10ML suspension Take 1 g by mouth 4 (four) times daily.  07/28/11  Yes Historical Provider, MD  diazepam (VALIUM) 5 MG tablet Take 5 mg by mouth  every 4 (four) hours as needed for anxiety.   Yes Historical Provider, MD  dicyclomine (BENTYL) 10 MG capsule Take 1 capsule (10 mg total) by mouth 3 (three) times daily. 11/16/14  Yes Jessica D Zehr, PA-C  furosemide (LASIX) 40 MG tablet Take 40 mg by mouth.   Yes Historical Provider, MD  glipiZIDE (GLUCOTROL) 5 MG tablet Take 5 mg by mouth daily before breakfast.   Yes Historical Provider, MD  Hypromellose (NATURAL BALANCE TEARS OP) Apply 1 drop to eye every morning.    Yes Historical Provider, MD  Linaclotide Rolan Lipa) 145 MCG CAPS capsule Take 1 capsule (145 mcg total) by mouth daily before breakfast. 11/16/14  Yes Laban Emperor Zehr, PA-C  meclizine (ANTIVERT) 50 MG tablet Take 0.5 tablets (25 mg total) by mouth 4 (four) times daily as needed for dizziness. 12/13/11  Yes  Virgel Manifold, MD  methocarbamol (ROBAXIN) 500 MG tablet Take 500 mg by mouth 2 (two) times daily as needed for muscle spasms.    Yes Historical Provider, MD  NUCYNTA ER 100 MG TB12 Take 1 tablet by mouth 3 (three) times daily. 01/16/15  Yes Historical Provider, MD  omeprazole (PRILOSEC) 20 MG capsule Take 1 capsule (20 mg total) by mouth daily. 08/30/13  Yes Ladene Artist, MD  ondansetron (ZOFRAN ODT) 4 MG disintegrating tablet Take 1 tablet (4 mg total) by mouth every 8 (eight) hours as needed for nausea or vomiting. 09/20/14  Yes Alvina Chou, PA-C  OxyCODONE (OXYCONTIN) 80 mg T12A 12 hr tablet Take 80 mg by mouth 3 (three) times daily.   Yes Historical Provider, MD  oxyCODONE-acetaminophen (PERCOCET) 10-325 MG per tablet Take 1 tablet by mouth every 6 (six) hours as needed for pain.    Yes Historical Provider, MD  Polyvinyl Alcohol-Povidone (CLEAR EYES ALL SEASONS) 5-6 MG/ML SOLN Apply 2 drops to eye 3 (three) times daily as needed (drye eyes.).    Yes Historical Provider, MD  potassium chloride SA (K-DUR,KLOR-CON) 20 MEQ tablet Take 1 tablet (20 mEq total) by mouth 2 (two) times daily. 11/07/14  Yes Volanda Napoleon, MD  prochlorperazine (COMPAZINE) 10 MG tablet Take 1 tablet (10 mg total) by mouth every 6 (six) hours as needed for nausea or vomiting. 04/18/14  Yes Volanda Napoleon, MD  Sennosides (LAXATIVE PILLS PO) Take 1 tablet by mouth daily as needed (constipation).   Yes Historical Provider, MD  Tamsulosin HCl (FLOMAX) 0.4 MG CAPS Take 0.4 mg by mouth daily.    Yes Historical Provider, MD  triamterene-hydrochlorothiazide (MAXZIDE-25) 37.5-25 MG per tablet Take 1 tablet by mouth daily.     Yes Historical Provider, MD  Vitamin D, Ergocalciferol, (DRISDOL) 50000 UNITS CAPS capsule Take 50,000 Units by mouth every 7 (seven) days.   Yes Historical Provider, MD   BP 172/83 mmHg  Pulse 88  Temp(Src) 98 F (36.7 C) (Oral)  Resp 16  Ht '5\' 11"'  (1.803 m)  Wt 192 lb (87.091 kg)  BMI 26.79 kg/m2   SpO2 99% Physical Exam  Constitutional: He is oriented to person, place, and time. Vital signs are normal. He appears well-developed and well-nourished.  Non-toxic appearance. He does not appear ill. No distress.  HENT:  Head: Normocephalic and atraumatic.  Nose: Nose normal.  Mouth/Throat: Oropharynx is clear and moist. No oropharyngeal exudate.  Eyes: Conjunctivae and EOM are normal. Pupils are equal, round, and reactive to light. No scleral icterus.  Neck: Normal range of motion. Neck supple. No tracheal deviation, no edema, no erythema and  normal range of motion present. No thyroid mass and no thyromegaly present.  Cardiovascular: Normal rate, regular rhythm, S1 normal, S2 normal, normal heart sounds, intact distal pulses and normal pulses.  Exam reveals no gallop and no friction rub.   No murmur heard. Pulmonary/Chest: Effort normal and breath sounds normal. No respiratory distress. He has no wheezes. He has no rhonchi. He has no rales.  Abdominal: Soft. Normal appearance and bowel sounds are normal. He exhibits no distension, no ascites and no mass. There is no hepatosplenomegaly. There is tenderness. There is no rebound, no guarding and no CVA tenderness.  Midline abdominal hernia that is easily reducible.   Musculoskeletal: Normal range of motion. He exhibits no edema or tenderness.  Lymphadenopathy:    He has no cervical adenopathy.  Neurological: He is alert and oriented to person, place, and time. He has normal strength. No cranial nerve deficit or sensory deficit.  Skin: Skin is warm, dry and intact. No petechiae and no rash noted. He is not diaphoretic. No erythema. No pallor.  Psychiatric: He has a normal mood and affect. His behavior is normal. Judgment normal.  Nursing note and vitals reviewed.   ED Course  Procedures  DIAGNOSTIC STUDIES: Oxygen Saturation is 99% on RA, normal by my interpretation.    COORDINATION OF CARE: 3:25 AM-Discussed treatment plan which includes  DG Chest, blood work, UA, magnesium, and EKG with pt at bedside and pt agreed to plan.   Labs Review Labs Reviewed  CBC WITH DIFFERENTIAL/PLATELET - Abnormal; Notable for the following:    Hemoglobin 12.0 (*)    HCT 35.9 (*)    MCV 73.0 (*)    MCH 24.4 (*)    RDW 17.0 (*)    All other components within normal limits  BASIC METABOLIC PANEL - Abnormal; Notable for the following:    Glucose, Bld 141 (*)    Creatinine, Ser 2.22 (*)    Calcium 8.8 (*)    GFR calc non Af Amer 29 (*)    GFR calc Af Amer 34 (*)    All other components within normal limits  URINE CULTURE  MAGNESIUM  URINALYSIS, ROUTINE W REFLEX MICROSCOPIC (NOT AT [facility])  CBG MONITORING, ED    Imaging Review Mr Brain Wo Contrast (neuro Protocol)  05/19/2015  CLINICAL DATA:  65 year old male with headaches for 2 months. Left foot drop and left hand numbness. Initial encounter. Personal history of multiple myeloma. EXAM: MRI HEAD WITHOUT CONTRAST TECHNIQUE: Multiplanar, multiecho pulse sequences of the brain and surrounding structures were obtained without intravenous contrast. COMPARISON:  Head CT without contrast 05/15/2015. Brain MRI 06/20/2011. FINDINGS: Major intracranial vascular flow voids are stable. Cerebral volume has not significantly changed since 2013. No restricted diffusion to suggest acute infarction. No midline shift, mass effect, evidence of mass lesion, ventriculomegaly, extra-axial collection or acute intracranial hemorrhage. Cervicomedullary junction and pituitary are within normal limits. Pearline Cables and white matter signal is within normal limits for age throughout the brain. No encephalomalacia or chronic cerebral blood products identified. Visible internal auditory structures appear normal. Mastoids are clear. Paranasal sinuses are clear. Orbit and scalp soft tissues are stable. Skull bone marrow signal is stable and within normal limits. Mildly more heterogeneous marrow signal in the visualized upper cervical spine  is stable with no suspicious marrow lesion. IMPRESSION: 1. Stable and normal for age noncontrast MRI appearance of the brain. 2. Stable bone marrow signal since 2013, no evidence of multiple myeloma involvement of the skull. Electronically Signed  By: Genevie Ann M.D.   On: 05/19/2015 09:02   Mr Lumbar Spine Wo Contrast  05/19/2015  CLINICAL DATA:  65 year old male with left foot drop. Multiple myeloma. Subsequent encounter. EXAM: MRI LUMBAR SPINE WITHOUT CONTRAST TECHNIQUE: Multiplanar, multisequence MR imaging of the lumbar spine was performed. No intravenous contrast was administered. COMPARISON:  CT Abdomen and Pelvis 11/14/2014. Lumbar MRI 10/02/2013. FINDINGS: Same numbering system as in 2015 designating normal lumbar segmentation. Chronic diffusely abnormal marrow signal in the visualized lower thoracic spine, lumbar spine, sacrum, and pelvis. No pathologic fracture identified. No definite marrow edema. Stable vertebral height and alignment. Visualized lower thoracic spinal cord is normal with conus medularis at L1. Noncontrast cauda equina nerve roots appear normal. Stable visualized abdominal viscera. Moderate distension of the urinary bladder, similar to the prior exam. As before, intervertebral disc spaces from T11-T12 -L1-L2 are normal. Normal disc hydration noted at L2-L3 and L3-L4, but chronic disc bulging at those levels. Superimposed mild facet hypertrophy at those levels is stable, and there remains no spinal or lateral recess stenosis. No significant foraminal stenosis. L4-L5: Chronic disc desiccation and right eccentric disc bulge with superimposed right foraminal disc protrusion (series 700, image 30). Stable mild facet hypertrophy. No spinal or lateral recess stenosis. Moderate right L4 foraminal stenosis appears increased. L5-S1: Chronic disc desiccation and left eccentric disc bulge chronic small left subarticular disc protrusion (series 400, image 9) has not significantly changed. This abuts  the descending left S1 nerve roots in the lateral recess as before, without significant nerve root displacement. Mild left L5 foraminal stenosis also appear stable. Mild facet hypertrophy with no spinal stenosis. IMPRESSION: 1. Chronic diffuse abnormal marrow signal from the lower thoracic spine to the pelvis is stable since 2015. This is nonspecific, with considerations including chronic multiple myeloma and renal osteodystrophy. No pathologic fracture or marrow edema identified. 2. Mild for age lumbar spine degeneration with no spinal stenosis. 3. Chronic right foraminal disc protrusion at L4-L5 affecting the exiting right L4 nerve. Stable chronic small left lateral recess disc protrusion at L5-S1 which could be a source for left S1 radiculitis. Mild left L5 foraminal stenosis also appears stable. 4. Moderate distension of the urinary bladder. Electronically Signed   By: Genevie Ann M.D.   On: 05/19/2015 09:11   I have personally reviewed and evaluated these images and lab results as part of my medical decision-making.   EKG Interpretation None      MDM   Final diagnoses:  None   Patient presents to the ED for weakness.  He was recently worked up with normal CT and MRI of the brain.  Exam shows a hernia which he knows about.  He was given pain medication and labs are unremarkable.  Patient given IVF as well.    Labs are unremarkable, patient feels better after IVF and pain medication.  This appears to be a chronic issue and PCP fu was advised within 3 days.  He looks well and in NAD.  VS remain within his normal limits and he is safe for DC.   I personally performed the services described in this documentation, which was scribed in my presence. The recorded information has been reviewed and is accurate.        Everlene Balls, MD 05/21/15 [phone]

## 2015-05-21 NOTE — Discharge Instructions (Signed)
Abdominal Pain, Adult [name], see your primary care doctor within 3 days for close follow up. If symptoms worsen, come back to the ED immediately.  Thank you. Many things can cause belly (abdominal) pain. Most times, the belly pain is not dangerous. Many cases of belly pain can be watched and treated at home. HOME CARE   Do not take medicines that help you go poop (laxatives) unless told to by your doctor.  Only take medicine as told by your doctor.  Eat or drink as told by your doctor. Your doctor will tell you if you should be on a special diet. GET HELP IF:  You do not know what is causing your belly pain.  You have belly pain while you are sick to your stomach (nauseous) or have runny poop (diarrhea).  You have pain while you pee or poop.  Your belly pain wakes you up at night.  You have belly pain that gets worse or better when you eat.  You have belly pain that gets worse when you eat fatty foods.  You have a fever. GET HELP RIGHT AWAY IF:   The pain does not go away within 2 hours.  You keep throwing up (vomiting).  The pain changes and is only in the right or left part of the belly.  You have bloody or tarry looking poop. MAKE SURE YOU:   Understand these instructions.  Will watch your condition.  Will get help right away if you are not doing well or get worse.   This information is not intended to replace advice given to you by your health care provider. Make sure you discuss any questions you have with your health care provider.   Document Released: 07/29/2007 Document Revised: 03/02/2014 Document Reviewed: 10/19/2012 Elsevier Interactive Patient Education 2016 Elsevier Inc. Weakness Weakness is a lack of strength. You may feel weak all over your body or just in one part of your body. Weakness can be serious. In some cases, you may need more medical tests. HOME [place] a well-balanced diet.  Try to exercise every day.  Only take  medicines as told by your doctor. GET HELP RIGHT AWAY IF:   You cannot do your normal daily activities.  You cannot walk up and down stairs, or you feel very tired when you do so.  You have shortness of breath or chest pain.  You have trouble moving parts of your body.  You have weakness in only one body part or on only one side of the body.  You have a fever.  You have trouble speaking or swallowing.  You cannot control when you pee (urinate) or poop (bowel movement).  You have black or bloody throw up (vomit) or poop.  Your weakness gets worse or spreads to other body parts.  You have new aches or pains. MAKE SURE YOU:   Understand these instructions.  Will watch your condition.  Will get help right away if you are not doing well or get worse.   This information is not intended to replace advice given to you by your health care provider. Make sure you discuss any questions you have with your health care provider.   Document Released: 01/23/2008 Document Revised: 08/11/2011 Document Reviewed: 04/10/2011 Elsevier Interactive Patient Education [employer].

## 2015-05-21 NOTE — ED Notes (Addendum)
Pt c/o weakness, more on L side. Pt has had CT and MRI this week and continues to feel presyncopal with n/v. Bone CA, last Wednesday last chemo

## 2015-05-22 LAB — URINE CULTURE: CULTURE: NO GROWTH

## 2015-05-27 ENCOUNTER — Ambulatory Visit (HOSPITAL_BASED_OUTPATIENT_CLINIC_OR_DEPARTMENT_OTHER): Payer: Medicare Other

## 2015-05-27 VITALS — BP 153/83 | HR 104 | Temp 99.7°F | Resp 16

## 2015-05-27 DIAGNOSIS — E291 Testicular hypofunction: Secondary | ICD-10-CM

## 2015-05-27 DIAGNOSIS — E349 Endocrine disorder, unspecified: Secondary | ICD-10-CM

## 2015-05-27 MED ORDER — TESTOSTERONE CYPIONATE 200 MG/ML IM SOLN
300.0000 mg | INTRAMUSCULAR | Status: DC
  Administered 2015-05-27: 300 mg via INTRAMUSCULAR

## 2015-05-27 MED ORDER — TESTOSTERONE CYPIONATE 200 MG/ML IM SOLN
INTRAMUSCULAR | Status: AC
  Filled 2015-05-27: qty 2

## 2015-05-27 NOTE — Patient Instructions (Signed)

## 2015-05-28 ENCOUNTER — Ambulatory Visit (INDEPENDENT_AMBULATORY_CARE_PROVIDER_SITE_OTHER): Payer: Medicare Other | Admitting: Podiatry

## 2015-05-28 ENCOUNTER — Encounter: Payer: Self-pay | Admitting: Podiatry

## 2015-05-28 VITALS — BP 134/71 | HR 91 | Resp 16

## 2015-05-28 DIAGNOSIS — M2042 Other hammer toe(s) (acquired), left foot: Secondary | ICD-10-CM | POA: Diagnosis not present

## 2015-05-28 DIAGNOSIS — Q828 Other specified congenital malformations of skin: Secondary | ICD-10-CM | POA: Diagnosis not present

## 2015-05-28 NOTE — Progress Notes (Signed)
   Subjective:    Patient ID: [name], male    DOB: 1950/11/24, 65 y.o.   MRN: [id number]  HPI he presents today with a chief complaint of a painful fifth digit left foot. He states that he has multiple myeloma and is unable to bend down to work on his foot. He states that he just wants to make sure that he does not have an infection.    Review of Systems  Musculoskeletal: Positive for myalgias, arthralgias and gait problem.  All other systems reviewed and are negative.      Objective:   Physical Exam: Vital signs are stable he is alert and oriented 3. Pulses are palpable. Neurologic sensorium is intact. Orthopedic evaluation demonstrated an adductor varus rotated hammertoe deformity fifth left with reactive hyperkeratosis to the lateral aspect of the distal phalanx and lateral nail border. Reactive hyperkeratotic lesion overlying the PIPJ is present also.        Assessment & Plan:  Corns and calluses fifth digit left foot with adductor varus rotated hammertoe deformity.  Plan: Debrided all reactive hyperkeratosis today and discussed appropriate shoe gear and will follow up with him as needed.

## 2015-06-05 DIAGNOSIS — E119 Type 2 diabetes mellitus without complications: Secondary | ICD-10-CM | POA: Insufficient documentation

## 2015-06-05 DIAGNOSIS — N184 Chronic kidney disease, stage 4 (severe): Secondary | ICD-10-CM | POA: Insufficient documentation

## 2015-06-05 DIAGNOSIS — I10 Essential (primary) hypertension: Secondary | ICD-10-CM | POA: Insufficient documentation

## 2015-06-05 DIAGNOSIS — R600 Localized edema: Secondary | ICD-10-CM | POA: Insufficient documentation

## 2015-06-10 ENCOUNTER — Ambulatory Visit (HOSPITAL_BASED_OUTPATIENT_CLINIC_OR_DEPARTMENT_OTHER): Payer: Medicare Other

## 2015-06-10 VITALS — BP 149/71 | HR 63 | Temp 98.9°F | Resp 16

## 2015-06-10 DIAGNOSIS — E349 Endocrine disorder, unspecified: Secondary | ICD-10-CM

## 2015-06-10 DIAGNOSIS — E291 Testicular hypofunction: Secondary | ICD-10-CM | POA: Diagnosis present

## 2015-06-10 MED ORDER — TESTOSTERONE CYPIONATE 200 MG/ML IM SOLN
INTRAMUSCULAR | Status: AC
  Filled 2015-06-10: qty 2

## 2015-06-10 MED ORDER — TESTOSTERONE CYPIONATE 200 MG/ML IM SOLN
300.0000 mg | INTRAMUSCULAR | Status: DC
  Administered 2015-06-10: 300 mg via INTRAMUSCULAR

## 2015-06-10 NOTE — Patient Instructions (Signed)

## 2015-06-18 ENCOUNTER — Encounter: Payer: Self-pay | Admitting: Podiatry

## 2015-06-18 ENCOUNTER — Ambulatory Visit (INDEPENDENT_AMBULATORY_CARE_PROVIDER_SITE_OTHER): Payer: Medicare Other | Admitting: Podiatry

## 2015-06-18 VITALS — BP 119/67 | HR 81 | Resp 16

## 2015-06-18 DIAGNOSIS — M2042 Other hammer toe(s) (acquired), left foot: Secondary | ICD-10-CM | POA: Diagnosis not present

## 2015-06-18 DIAGNOSIS — Q828 Other specified congenital malformations of skin: Secondary | ICD-10-CM

## 2015-06-18 NOTE — Progress Notes (Signed)
He presents today for follow-up of his painful porokeratotic lesion to the fifth digit of the left foot. He states that he is 100% improved.  Objective: Vital signs are stable he's alert and oriented 3 pulses are palpable left foot. Adductovarus rotated hammertoe deformity left foot is yielding a reactive hyperkeratosis to the lateral and dorsal lateral aspect of the toe itself. No signs of infection.  Assessment: Pain in limb secondary to reactive hyperkeratosis and hammertoe deformity.  Plan: Debridement of reactive hyperkeratotic lesion for him. I will follow-up with him on an as-needed basis.

## 2015-07-15 ENCOUNTER — Other Ambulatory Visit (HOSPITAL_BASED_OUTPATIENT_CLINIC_OR_DEPARTMENT_OTHER): Payer: Medicare Other

## 2015-07-15 ENCOUNTER — Ambulatory Visit (HOSPITAL_BASED_OUTPATIENT_CLINIC_OR_DEPARTMENT_OTHER): Payer: Medicare Other | Admitting: Hematology & Oncology

## 2015-07-15 ENCOUNTER — Ambulatory Visit (HOSPITAL_BASED_OUTPATIENT_CLINIC_OR_DEPARTMENT_OTHER)
Admission: RE | Admit: 2015-07-15 | Discharge: 2015-07-15 | Disposition: A | Discharge status: Home / Self Care | Payer: Medicare Other | Source: Ambulatory Visit | Attending: Hematology & Oncology | Admitting: Hematology & Oncology

## 2015-07-15 ENCOUNTER — Telehealth: Payer: Self-pay

## 2015-07-15 ENCOUNTER — Encounter: Payer: Self-pay | Admitting: Hematology & Oncology

## 2015-07-15 ENCOUNTER — Ambulatory Visit (HOSPITAL_BASED_OUTPATIENT_CLINIC_OR_DEPARTMENT_OTHER): Payer: Medicare Other

## 2015-07-15 VITALS — BP 154/77 | HR 76 | Temp 98.7°F | Resp 16 | Ht 71.0 in | Wt 193.0 lb

## 2015-07-15 DIAGNOSIS — E291 Testicular hypofunction: Secondary | ICD-10-CM

## 2015-07-15 DIAGNOSIS — M25511 Pain in right shoulder: Secondary | ICD-10-CM

## 2015-07-15 DIAGNOSIS — E349 Endocrine disorder, unspecified: Secondary | ICD-10-CM

## 2015-07-15 DIAGNOSIS — D509 Iron deficiency anemia, unspecified: Secondary | ICD-10-CM

## 2015-07-15 DIAGNOSIS — N189 Chronic kidney disease, unspecified: Secondary | ICD-10-CM

## 2015-07-15 DIAGNOSIS — C9000 Multiple myeloma not having achieved remission: Secondary | ICD-10-CM

## 2015-07-15 DIAGNOSIS — E119 Type 2 diabetes mellitus without complications: Secondary | ICD-10-CM

## 2015-07-15 DIAGNOSIS — D631 Anemia in chronic kidney disease: Secondary | ICD-10-CM

## 2015-07-15 DIAGNOSIS — M797 Fibromyalgia: Secondary | ICD-10-CM

## 2015-07-15 DIAGNOSIS — M2550 Pain in unspecified joint: Secondary | ICD-10-CM

## 2015-07-15 LAB — CBC WITH DIFFERENTIAL (CANCER CENTER ONLY)
BASO#: 0.1 10*3/uL (ref 0.0–0.2)
BASO%: 1.2 % (ref 0.0–2.0)
EOS%: 3.6 % (ref 0.0–7.0)
Eosinophils Absolute: 0.2 10*3/uL (ref 0.0–0.5)
HEMATOCRIT: 39.2 % (ref 38.7–49.9)
HGB: 13.4 g/dL (ref 13.0–17.1)
LYMPH#: 1.6 10*3/uL (ref 0.9–3.3)
LYMPH%: 28.1 % (ref 14.0–48.0)
MCH: 25.2 pg — ABNORMAL LOW (ref 28.0–33.4)
MCHC: 34.2 g/dL (ref 32.0–35.9)
MCV: 74 fL — AB (ref 82–98)
MONO#: 0.4 10*3/uL (ref 0.1–0.9)
MONO%: 7.8 % (ref 0.0–13.0)
NEUT#: 3.3 10*3/uL (ref 1.5–6.5)
NEUT%: 59.3 % (ref 40.0–80.0)
Platelets: 240 10*3/uL (ref 145–400)
RBC: 5.31 10*6/uL (ref 4.20–5.70)
RDW: 15.2 % (ref 11.1–15.7)
WBC: 5.6 10*3/uL (ref 4.0–10.0)

## 2015-07-15 LAB — CMP (CANCER CENTER ONLY)
ALK PHOS: 83 U/L (ref 26–84)
ALT: 39 U/L (ref 10–47)
AST: 32 U/L (ref 11–38)
Albumin: 3.8 g/dL (ref 3.3–5.5)
BUN, Bld: 28 mg/dL — ABNORMAL HIGH (ref 7–22)
CALCIUM: 9.6 mg/dL (ref 8.0–10.3)
CO2: 28 mEq/L (ref 18–33)
Chloride: 101 mEq/L (ref 98–108)
Creat: 2.2 mg/dl — ABNORMAL HIGH (ref 0.6–1.2)
GLUCOSE: 152 mg/dL — AB (ref 73–118)
POTASSIUM: 3.9 meq/L (ref 3.3–4.7)
Sodium: 137 mEq/L (ref 128–145)
Total Bilirubin: 0.6 mg/dl (ref 0.20–1.60)
Total Protein: 8.7 g/dL — ABNORMAL HIGH (ref 6.4–8.1)

## 2015-07-15 LAB — IRON AND TIBC
%SAT: 55 % (ref 20–55)
IRON: 124 ug/dL (ref 42–163)
TIBC: 228 ug/dL (ref 202–409)
UIBC: 103 ug/dL — AB (ref 117–376)

## 2015-07-15 LAB — FERRITIN

## 2015-07-15 MED ORDER — KETOROLAC TROMETHAMINE 15 MG/ML IJ SOLN
INTRAMUSCULAR | Status: AC
  Filled 2015-07-15: qty 2

## 2015-07-15 MED ORDER — PAMIDRONATE DISODIUM 30 MG/10ML IV SOLN
60.0000 mg | Freq: Once | INTRAVENOUS | Status: AC
  Administered 2015-07-15: 60 mg via INTRAVENOUS
  Filled 2015-07-15: qty 10

## 2015-07-15 MED ORDER — TESTOSTERONE CYPIONATE 200 MG/ML IM SOLN
300.0000 mg | INTRAMUSCULAR | Status: DC
  Administered 2015-07-15: 300 mg via INTRAMUSCULAR

## 2015-07-15 MED ORDER — TESTOSTERONE CYPIONATE 200 MG/ML IM SOLN
INTRAMUSCULAR | Status: AC
  Filled 2015-07-15: qty 2

## 2015-07-15 MED ORDER — KETOROLAC TROMETHAMINE 15 MG/ML IJ SOLN
30.0000 mg | Freq: Once | INTRAMUSCULAR | Status: AC
  Administered 2015-07-15: 30 mg via INTRAVENOUS

## 2015-07-15 NOTE — Telephone Encounter (Signed)
Call- No myeloma in the shoulder!! Just wear and tear!! pete  Above message provided to pt via phone. Verbalizes understanding and appreciation. dph

## 2015-07-15 NOTE — Progress Notes (Signed)
Hematology and Oncology Follow Up Visit  [name] [id number] May 13, 1950 65 y.o. 07/15/2015   Principle Diagnosis:  1. IgG kappa myeloma. 2. Anemia secondary to renal insufficiency. 3. Intermittent iron-deficiency anemia. 4. Hypotestosteronemia  Current Therapy:   1. Aredia 60 mg IV q.2 months  2. Aranesp 300 mcg subcu as needed for hemoglobin less than 11. 3. Depo-Testosterone 300 mg q.2 weeks. 4. IV iron as indicated.     Interim History:  Mr.  [name] is back for followup. He is doing a little bit better. However, he is complaining of pain in the right shoulder blade. His has been going on for about 2 weeks. He denies any type of trauma. He's noticed some swelling. This mostly is in the posterior shoulder. We will go ahead and get an x-ray to make sure there is no myeloma involvement.  His myeloma studies have been holding pretty stable. His last M spike was 1.2 g/ deciliter. His last IgG level was 1833 mg/dL. Again these were all stable.   He says his blood sugars are doing okay.  He's had some issues with his fibromyalgia.  His iron studies have been pretty well. The last time that he received IV iron was back in December 2015. When we saw him back in March, his ferritin was 1275. This elevation is mostly from inflammatory reasons. His iron saturation was 28%.  The last time he got Aranesp was back in September 2015. His testosterone that we give him does seem to make a difference. He does seem to have more energy with testosterone.  He's had no bleeding. He's had no change in bowel or bladder habits.  Overall, his performance status is ECOG 1.   Medications:  Current outpatient prescriptions:  .  amLODipine (NORVASC) 10 MG tablet, Take 10 mg by mouth daily., Disp: , Rfl:  .  CARAFATE 1 GM/10ML suspension, Take 1 g by mouth 4 (four) times daily. , Disp: , Rfl:  .  diazepam (VALIUM) 5 MG tablet, Take 5 mg by mouth every 4 (four) hours as needed for anxiety., Disp: ,  Rfl:  .  dicyclomine (BENTYL) 10 MG capsule, Take 1 capsule (10 mg total) by mouth 3 (three) times daily., Disp: 90 capsule, Rfl: 2 .  furosemide (LASIX) 40 MG tablet, Take 40 mg by mouth., Disp: , Rfl:  .  glipiZIDE (GLUCOTROL) 5 MG tablet, Take 5 mg by mouth daily before breakfast., Disp: , Rfl:  .  Hypromellose (NATURAL BALANCE TEARS OP), Apply 1 drop to eye every morning. , Disp: , Rfl:  .  Linaclotide (LINZESS) 145 MCG CAPS capsule, Take 1 capsule (145 mcg total) by mouth daily before breakfast., Disp: 30 capsule, Rfl: 2 .  meclizine (ANTIVERT) 50 MG tablet, Take 0.5 tablets (25 mg total) by mouth 4 (four) times daily as needed for dizziness., Disp: 30 tablet, Rfl: 0 .  meloxicam (MOBIC) 15 MG tablet, Take 15 mg by mouth., Disp: , Rfl:  .  methocarbamol (ROBAXIN) 500 MG tablet, Take 500 mg by mouth 2 (two) times daily as needed for muscle spasms. , Disp: , Rfl:  .  metoprolol succinate (TOPROL XL) 50 MG 24 hr tablet, Take 50 mg by mouth., Disp: , Rfl:  .  NUCYNTA ER 100 MG TB12, Take 1 tablet by mouth 3 (three) times daily., Disp: , Rfl: 0 .  omeprazole (PRILOSEC) 20 MG capsule, Take 1 capsule (20 mg total) by mouth daily., Disp: 30 capsule, Rfl: 11 .  ondansetron (ZOFRAN ODT) 4  MG disintegrating tablet, Take 1 tablet (4 mg total) by mouth every 8 (eight) hours as needed for nausea or vomiting., Disp: 10 tablet, Rfl: 0 .  OxyCODONE (OXYCONTIN) 80 mg T12A 12 hr tablet, Take 80 mg by mouth 3 (three) times daily., Disp: , Rfl:  .  oxyCODONE-acetaminophen (PERCOCET) 10-325 MG per tablet, Take 1 tablet by mouth every 6 (six) hours as needed for pain. , Disp: , Rfl:  .  Polyvinyl Alcohol-Povidone (CLEAR EYES ALL SEASONS) 5-6 MG/ML SOLN, Apply 2 drops to eye 3 (three) times daily as needed (drye eyes.). , Disp: , Rfl:  .  potassium chloride SA (K-DUR,KLOR-CON) 20 MEQ tablet, Take 1 tablet (20 mEq total) by mouth 2 (two) times daily., Disp: 60 tablet, Rfl: 6 .  prochlorperazine (COMPAZINE) 10 MG  tablet, Take 1 tablet (10 mg total) by mouth every 6 (six) hours as needed for nausea or vomiting., Disp: 60 tablet, Rfl: 2 .  Sennosides (LAXATIVE PILLS PO), Take 1 tablet by mouth daily as needed (constipation)., Disp: , Rfl:  .  sildenafil (VIAGRA) 100 MG tablet, Take 100 mg by mouth., Disp: , Rfl:  .  Tamsulosin HCl (FLOMAX) 0.4 MG CAPS, Take 0.4 mg by mouth daily. , Disp: , Rfl:  .  triamterene-hydrochlorothiazide (MAXZIDE-25) 37.5-25 MG per tablet, Take 1 tablet by mouth daily.  , Disp: , Rfl:  .  Vitamin D, Ergocalciferol, (DRISDOL) 50000 UNITS CAPS capsule, Take 50,000 Units by mouth every 7 (seven) days., Disp: , Rfl:  .  [DISCONTINUED] potassium chloride (KLOR-CON) 20 MEQ packet, Take 20 mEq by mouth daily. , Disp: , Rfl:  No current facility-administered medications for this visit.  Facility-Administered Medications Ordered in Other Visits:  .  morphine 4 MG/ML injection 4 mg, 4 mg, Intravenous, Q4H PRN, Volanda Napoleon, MD, 0 mg at 05/15/15 1317  Allergies:  Allergies  Allergen Reactions  . Iodine Anxiety and Rash    Didn't feel right "instructed not to take per MD--something with his port"    Past Medical History, Surgical history, Social history, and Family History were reviewed and updated.  Review of Systems: As above  Physical Exam:  height is 5\' 11"  (1.803 m) and weight is 193 lb (87.544 kg). His oral temperature is 98.7 F (37.1 C). His blood pressure is 154/77 and his pulse is 76. His respiration is 16.   His head and neck exam shows no ocular or oral lesions. He has no adenopathy in the neck. His thyroid is non-palpable. Lungs are clear. Cardiac exam regular rate and rhythm with no murmurs, rubs or bruits.. Abdomen is soft. His abdomen is slightly distended. He has good bowel sounds. There is no fluid wave. There is no palpable liver or spleen tip. Back exam shows no tenderness in the lumbar spine. There is no paravertebral muscle spasms. . He has slightly decreased  range of motion of the lower back. Extremities shows no clubbing cyanosis or edema.He does have a swelling in the posterior right shoulder. There is some tenderness in this area to palpation. He has fairly decent range of motion of the right arm. He has decent strength in his legs. Skin exam shows no rashes. Neurological exam is nonfocal.  Lab Results  Component Value Date   WBC 5.6 07/15/2015   HGB 13.4 07/15/2015   HCT 39.2 07/15/2015   MCV 74* 07/15/2015   PLT 240 07/15/2015     Chemistry      Component Value Date/Time   NA 143 05/21/2015  0353   NA 139 05/15/2015 0743   NA 137 08/23/2012 2105   K 3.7 05/21/2015 0353   K 3.4 05/15/2015 0743   K 4.0 08/23/2012 2105   CL 106 05/21/2015 0353   CL 98 05/15/2015 0743   CL 101 08/23/2012 2105   CO2 27 05/21/2015 0353   CO2 29 05/15/2015 0743   CO2 32 08/23/2012 2105   BUN 17 05/21/2015 0353   BUN 20 05/15/2015 0743   BUN 23* 08/23/2012 2105   CREATININE 2.22* 05/21/2015 0353   CREATININE 2.4* 05/15/2015 0743   CREATININE 2.53* 08/23/2012 2105      Component Value Date/Time   CALCIUM 8.8* 05/21/2015 0353   CALCIUM 9.0 05/15/2015 0743   CALCIUM 8.7 08/23/2012 2105   ALKPHOS 71 05/19/2015 0715   ALKPHOS 74 05/15/2015 0743   ALKPHOS 101 08/23/2012 2105   AST 18 05/19/2015 0715   AST 30 05/15/2015 0743   AST 27 08/23/2012 2105   ALT 18 05/19/2015 0715   ALT 25 05/15/2015 0743   ALT 21 08/23/2012 2105   BILITOT 0.8 05/19/2015 0715   BILITOT 0.70 05/15/2015 0743   BILITOT 0.2 08/23/2012 2105         Impression and Plan: Mr. Maxon is a 65 year old gentleman. He has multiple medical problems. I still do not think that myeloma is an issue.However, with the right shoulder, we have to eval her for any kind of myeloma involvement. If there is evidence of myeloma lesions, no him and consider intervention with therapy.   He has fibromyalgia. He has diabetes. He has low testosterone.   He will get his Aredia today.   I will  plan to get back to see me in 2 more months.  Volanda Napoleon, MD 5/22/20178:54 AM

## 2015-07-15 NOTE — Patient Instructions (Signed)
Testosterone injection What is this medicine? TESTOSTERONE (tes TOS ter one) is the main male hormone. It supports normal male development such as muscle growth, facial hair, and deep voice. It is used in males to treat low testosterone levels. This medicine may be used for other purposes; ask your health care provider or pharmacist if you have questions. What should I tell my health care provider before I take this medicine? They need to know if you have any of these conditions: -breast cancer -diabetes -heart disease -kidney disease -liver disease -lung disease -prostate cancer, enlargement -an unusual or allergic reaction to testosterone, other medicines, foods, dyes, or preservatives -pregnant or trying to get pregnant -breast-feeding How should I use this medicine? This medicine is for injection into a muscle. It is usually given by a health care professional in a hospital or clinic setting. Contact your pediatrician regarding the use of this medicine in children. While this medicine may be prescribed for children as young as 12 years of age for selected conditions, precautions do apply. Overdosage: If you think you have taken too much of this medicine contact a poison control center or emergency room at once. NOTE: This medicine is only for you. Do not share this medicine with others. What if I miss a dose? Try not to miss a dose. Your doctor or health care professional will tell you when your next injection is due. Notify the office if you are unable to keep an appointment. What may interact with this medicine? -medicines for diabetes -medicines that treat or prevent blood clots like warfarin -oxyphenbutazone -propranolol -steroid medicines like prednisone or cortisone This list may not describe all possible interactions. Give your health care provider a list of all the medicines, herbs, non-prescription drugs, or dietary supplements you use. Also tell them if you smoke, drink  alcohol, or use illegal drugs. Some items may interact with your medicine. What should I watch for while using this medicine? Visit your doctor or health care professional for regular checks on your progress. They will need to check the level of testosterone in your blood. This medicine is only approved for use in men who have low levels of testosterone related to certain medical conditions. Heart attacks and strokes have been reported with the use of this medicine. Notify your doctor or health care professional and seek emergency treatment if you develop breathing problems; changes in vision; confusion; chest pain or chest tightness; sudden arm pain; severe, sudden headache; trouble speaking or understanding; sudden numbness or weakness of the face, arm or leg; loss of balance or coordination. Talk to your doctor about the risks and benefits of this medicine. This medicine may affect blood sugar levels. If you have diabetes, check with your doctor or health care professional before you change your diet or the dose of your diabetic medicine. This drug is banned from use in athletes by most athletic organizations. What side effects may I notice from receiving this medicine? Side effects that you should report to your doctor or health care professional as soon as possible: -allergic reactions like skin rash, itching or hives, swelling of the face, lips, or tongue -breast enlargement -breathing problems -changes in mood, especially anger, depression, or rage -dark urine -general ill feeling or flu-like symptoms -light-colored stools -loss of appetite, nausea -nausea, vomiting -right upper belly pain -stomach pain -swelling of ankles -too frequent or persistent erections -trouble passing urine or change in the amount of urine -unusually weak or tired -yellowing of the eyes or   skin Additional side effects that can occur in women include: -deep or hoarse voice -facial hair growth -irregular  menstrual periods Side effects that usually do not require medical attention (report to your doctor or health care professional if they continue or are bothersome): -acne -change in sex drive or performance -hair loss -headache This list may not describe all possible side effects. Call your doctor for medical advice about side effects. You may report side effects to FDA at 1-800-FDA-1088. Where should I keep my medicine? Keep out of the reach of children. This medicine can be abused. Keep your medicine in a safe place to protect it from theft. Do not share this medicine with anyone. Selling or giving away this medicine is dangerous and against the law. Store at room temperature between 20 and 25 degrees C (68 and 77 degrees F). Do not freeze. Protect from light. Follow the directions for the product you are prescribed. Throw away any unused medicine after the expiration date. NOTE: This sheet is a summary. It may not cover all possible information. If you have questions about this medicine, talk to your doctor, pharmacist, or health care provider.    2016, [employer] Standard. (2013-04-27 [id number]) Pamidronate injection What is this medicine? PAMIDRONATE (pa mi DROE nate) slows calcium loss from bones. It is used to treat high calcium blood levels from cancer or Paget's disease. It is also used to treat bone pain and prevent fractures from certain cancers that have spread to the bone. This medicine may be used for other purposes; ask your health care provider or pharmacist if you have questions. What should I tell my health care provider before I take this medicine? They need to know if you have any of these conditions: -aspirin-sensitive asthma -dental disease -kidney disease -an unusual or allergic reaction to pamidronate, other medicines, foods, dyes, or preservatives -pregnant or trying to get pregnant -breast-feeding How should I use this medicine? This medicine is for infusion  into a vein. It is given by a health care professional in a hospital or clinic setting. Talk to your pediatrician regarding the use of this medicine in children. This medicine is not approved for use in children. Overdosage: If you think you have taken too much of this medicine contact a poison control center or emergency room at once. NOTE: This medicine is only for you. Do not share this medicine with others. What if I miss a dose? This does not apply. What may interact with this medicine? -certain antibiotics given by injection -medicines for inflammation or pain like ibuprofen, naproxen -some diuretics like bumetanide, furosemide -cyclosporine -parathyroid hormone -tacrolimus -teriparatide -thalidomide This list may not describe all possible interactions. Give your health care provider a list of all the medicines, herbs, non-prescription drugs, or dietary supplements you use. Also tell them if you smoke, drink alcohol, or use illegal drugs. Some items may interact with your medicine. What should I watch for while using this medicine? Visit your doctor or health care professional for regular checkups. It may be some time before you see the benefit from this medicine. Do not stop taking your medicine unless your doctor tells you to. Your doctor may order blood tests or other tests to see how you are doing. Women should inform their doctor if they wish to become pregnant or think they might be pregnant. There is a potential for serious side effects to an unborn child. Talk to your health care professional or pharmacist for more information. You should make sure  that you get enough calcium and vitamin D while you are taking this medicine. Discuss the foods you eat and the vitamins you take with your health care professional. Some people who take this medicine have severe bone, joint, and/or muscle pain. This medicine may also increase your risk for a broken thigh bone. Tell your doctor right away  if you have pain in your upper leg or groin. Tell your doctor if you have any pain that does not go away or that gets worse. What side effects may I notice from receiving this medicine? Side effects that you should report to your doctor or health care professional as soon as possible: -allergic reactions like skin rash, itching or hives, swelling of the face, lips, or tongue -black or tarry stools -changes in vision -eye inflammation, pain -high blood pressure -jaw pain, especially burning or cramping -muscle weakness -numb, tingling pain -swelling of feet or hands -trouble passing urine or change in the amount of urine -unable to move easily Side effects that usually do not require medical attention (report to your doctor or health care professional if they continue or are bothersome): -bone, joint, or muscle pain -constipation -dizzy, drowsy -fever -headache -loss of appetite -nausea, vomiting -pain at site where injected This list may not describe all possible side effects. Call your doctor for medical advice about side effects. You may report side effects to FDA at 1-800-FDA-1088. Where should I keep my medicine? This drug is given in a hospital or clinic and will not be stored at home. NOTE: This sheet is a summary. It may not cover all possible information. If you have questions about this medicine, talk to your doctor, pharmacist, or health care provider.    2016, [employer] Standard. (2010-08-08 08:49:49)

## 2015-07-15 NOTE — Addendum Note (Signed)
Addended by: Burney Gauze R on: 07/15/2015 09:26 AM   Modules accepted: Orders

## 2015-07-16 LAB — IGG, IGA, IGM
IGA/IMMUNOGLOBULIN A, SERUM: 361 mg/dL (ref 61–437)
IgG, Qn, Serum: 2082 mg/dL — ABNORMAL HIGH (ref 700–1600)
IgM, Qn, Serum: 79 mg/dL (ref 20–172)

## 2015-07-16 LAB — KAPPA/LAMBDA LIGHT CHAINS
IG LAMBDA FREE LIGHT CHAIN: 27.47 mg/L — AB (ref 5.71–26.30)
Ig Kappa Free Light Chain: 44.42 mg/L — ABNORMAL HIGH (ref 3.30–19.40)
KAPPA/LAMBDA FLC RATIO: 1.62 (ref 0.26–1.65)

## 2015-07-16 LAB — TESTOSTERONE: TESTOSTERONE: 191 ng/dL — AB (ref 348–1197)

## 2015-07-17 ENCOUNTER — Ambulatory Visit: Payer: Self-pay

## 2015-07-17 ENCOUNTER — Ambulatory Visit: Payer: Self-pay | Admitting: Hematology & Oncology

## 2015-07-17 ENCOUNTER — Other Ambulatory Visit: Payer: Self-pay

## 2015-07-18 LAB — PROTEIN ELECTROPHORESIS, SERUM, WITH REFLEX
A/G RATIO SPE: 0.9 (ref 0.7–1.7)
ALBUMIN: 3.8 g/dL (ref 2.9–4.4)
Alpha 1: 0.2 g/dL (ref 0.0–0.4)
Alpha 2: 1.1 g/dL — ABNORMAL HIGH (ref 0.4–1.0)
BETA: 1.1 g/dL (ref 0.7–1.3)
Gamma Globulin: 2.1 g/dL — ABNORMAL HIGH (ref 0.4–1.8)
Globulin, Total: 4.4 g/dL — ABNORMAL HIGH (ref 2.2–3.9)
INTERPRETATION(SEE BELOW): 0
M-Spike, %: 1 g/dL — ABNORMAL HIGH
TOTAL PROTEIN: 8.2 g/dL (ref 6.0–8.5)

## 2015-07-21 ENCOUNTER — Emergency Department (HOSPITAL_COMMUNITY): Payer: Medicare Other

## 2015-07-21 ENCOUNTER — Encounter (HOSPITAL_COMMUNITY): Payer: Self-pay | Admitting: Emergency Medicine

## 2015-07-21 ENCOUNTER — Emergency Department (HOSPITAL_COMMUNITY)
Admission: EM | Admit: 2015-07-21 | Discharge: 2015-07-21 | Disposition: A | Discharge status: Home / Self Care | Payer: Medicare Other | Attending: Emergency Medicine | Admitting: Emergency Medicine

## 2015-07-21 DIAGNOSIS — E1140 Type 2 diabetes mellitus with diabetic neuropathy, unspecified: Secondary | ICD-10-CM | POA: Insufficient documentation

## 2015-07-21 DIAGNOSIS — Z79899 Other long term (current) drug therapy: Secondary | ICD-10-CM | POA: Diagnosis not present

## 2015-07-21 DIAGNOSIS — Z791 Long term (current) use of non-steroidal anti-inflammatories (NSAID): Secondary | ICD-10-CM | POA: Insufficient documentation

## 2015-07-21 DIAGNOSIS — M549 Dorsalgia, unspecified: Secondary | ICD-10-CM | POA: Diagnosis present

## 2015-07-21 DIAGNOSIS — I10 Essential (primary) hypertension: Secondary | ICD-10-CM | POA: Insufficient documentation

## 2015-07-21 DIAGNOSIS — Z8579 Personal history of other malignant neoplasms of lymphoid, hematopoietic and related tissues: Secondary | ICD-10-CM | POA: Insufficient documentation

## 2015-07-21 DIAGNOSIS — Z7984 Long term (current) use of oral hypoglycemic drugs: Secondary | ICD-10-CM | POA: Insufficient documentation

## 2015-07-21 DIAGNOSIS — B029 Zoster without complications: Secondary | ICD-10-CM | POA: Insufficient documentation

## 2015-07-21 LAB — URINALYSIS, ROUTINE W REFLEX MICROSCOPIC
BILIRUBIN URINE: NEGATIVE
Glucose, UA: NEGATIVE mg/dL
Ketones, ur: NEGATIVE mg/dL
Leukocytes, UA: NEGATIVE
NITRITE: NEGATIVE
PROTEIN: NEGATIVE mg/dL
Specific Gravity, Urine: 1.008 (ref 1.005–1.030)
pH: 6 (ref 5.0–8.0)

## 2015-07-21 LAB — CBC WITH DIFFERENTIAL/PLATELET
BASOS PCT: 0 %
Basophils Absolute: 0 10*3/uL (ref 0.0–0.1)
EOS ABS: 0.2 10*3/uL (ref 0.0–0.7)
EOS PCT: 2 %
HCT: 38.7 % — ABNORMAL LOW (ref 39.0–52.0)
Hemoglobin: 12.8 g/dL — ABNORMAL LOW (ref 13.0–17.0)
LYMPHS ABS: 2.2 10*3/uL (ref 0.7–4.0)
Lymphocytes Relative: 24 %
MCH: 24.8 pg — AB (ref 26.0–34.0)
MCHC: 33.1 g/dL (ref 30.0–36.0)
MCV: 75 fL — ABNORMAL LOW (ref 78.0–100.0)
Monocytes Absolute: 0.8 10*3/uL (ref 0.1–1.0)
Monocytes Relative: 8 %
Neutro Abs: 6.1 10*3/uL (ref 1.7–7.7)
Neutrophils Relative %: 66 %
PLATELETS: 250 10*3/uL (ref 150–400)
RBC: 5.16 MIL/uL (ref 4.22–5.81)
RDW: 15.3 % (ref 11.5–15.5)
WBC: 9.3 10*3/uL (ref 4.0–10.5)

## 2015-07-21 LAB — COMPREHENSIVE METABOLIC PANEL
ALK PHOS: 86 U/L (ref 38–126)
ALT: 33 U/L (ref 17–63)
AST: 28 U/L (ref 15–41)
Albumin: 4.1 g/dL (ref 3.5–5.0)
Anion gap: 9 (ref 5–15)
BILIRUBIN TOTAL: 0.7 mg/dL (ref 0.3–1.2)
BUN: 22 mg/dL — AB (ref 6–20)
CALCIUM: 8.8 mg/dL — AB (ref 8.9–10.3)
CHLORIDE: 103 mmol/L (ref 101–111)
CO2: 23 mmol/L (ref 22–32)
CREATININE: 2.19 mg/dL — AB (ref 0.61–1.24)
GFR, EST AFRICAN AMERICAN: 34 mL/min — AB (ref >60)
GFR, EST NON AFRICAN AMERICAN: 30 mL/min — AB (ref >60)
Glucose, Bld: 143 mg/dL — ABNORMAL HIGH (ref 65–99)
Potassium: 4.2 mmol/L (ref 3.5–5.1)
Sodium: 135 mmol/L (ref 135–145)
Total Protein: 8.3 g/dL — ABNORMAL HIGH (ref 6.5–8.1)

## 2015-07-21 LAB — URINE MICROSCOPIC-ADD ON: Bacteria, UA: NONE SEEN

## 2015-07-21 MED ORDER — HYDROMORPHONE HCL 1 MG/ML IJ SOLN
1.0000 mg | Freq: Once | INTRAMUSCULAR | Status: DC
  Filled 2015-07-21: qty 1

## 2015-07-21 MED ORDER — VALACYCLOVIR HCL 1 G PO TABS: 1000.0000 mg | ORAL_TABLET | Freq: Two times a day (BID) | ORAL | Status: AC

## 2015-07-21 MED ORDER — HYDROMORPHONE HCL 1 MG/ML IJ SOLN
1.0000 mg | Freq: Once | INTRAMUSCULAR | Status: AC
  Administered 2015-07-21: 1 mg via INTRAMUSCULAR

## 2015-07-21 NOTE — ED Provider Notes (Signed)
CSN: [id number]     Arrival date & time 07/21/15  1532 History   First MD Initiated Contact with Patient 07/21/15 1557     Chief Complaint  Patient presents with  . Back Pain     Patient is a 65 y.o. male presenting with back pain. The history is provided by the patient. No language interpreter was used.  Back Pain  [name] is a 65 y.o. male who presents to the Emergency Department complaining of right side pain.  He reports one month of right shoulder/scapular pain that has significantly worsened over the last two weeks.  He saw his oncologist and had a shoulder xray performed that was normal.  He also reports two weeks of a painful and burning rash to the right side of his rash.  He has had shingles twice before this year but states that was itchy in nature and this episode is more painful.  He denies fevers, abdominal pain, vomiting, chest pain.  He does have some dysuria and recently completed treatment for a UTI by his Urologist.   Past Medical History  Diagnosis Date  . Multiple myeloma ([place])   . Diabetes mellitus without complication ([place])   . Multiple myeloma   . Hypertension   . Renal insufficiency   . Diverticulosis   . Hemorrhoids   . Type 2 diabetes mellitus ([place])   . Anxiety disorder   . Urinary retention   . Anemia   . Diabetic neuropathy ([place])   . Constipation   . Herpes zoster   . Arthritis   . Hyperlipidemia   . Myeloma ([place]) 12/31/2010  . Arthralgia of multiple joints 12/31/2010  . Anemia, iron deficiency 03/12/2011  . 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 Hypotestosteronism 03/17/2012   Past Surgical History  Procedure Laterality Date  . Knee surgery    . Knee arthroscopy      left  . Portacath placement    . Cataract extraction      bi-lateral  . Transurethral resection of prostate  01/2011    Dr. at [facility] in [place]  . Colonoscopy    . Esophagogastroduodenoscopy    . Colonoscopy  07/13/2011    Procedure: COLONOSCOPY;  Surgeon:  Ladene Artist, MD,FACG;  Location: [facility];  Service: Endoscopy;  Laterality: N/A;   Family History  Problem Relation Age of Onset  . Breast cancer Sister   . Breast cancer Mother   . Diabetes Sister   . Bone cancer Father   . Colon cancer Neg Hx   . Heart disease Sister   . Heart disease Mother   . Heart disease Daughter   . Throat cancer Brother   . Stomach cancer Brother    Social History  Substance Use Topics  . Smoking status: Never Smoker   . Smokeless tobacco: Never Used     Comment: never used tobacco  . Alcohol Use: No    Review of Systems  Musculoskeletal: Positive for back pain.  All other systems reviewed and are negative.     Allergies  Iodine  Home Medications   Prior to Admission medications   Medication Sig Start Date End Date Taking? Authorizing Provider  amLODipine (NORVASC) 10 MG tablet Take 10 mg by mouth daily.    Historical Provider, MD  CARAFATE 1 GM/10ML suspension Take 1 g by mouth 4 (four) times daily.  07/28/11   Historical Provider, MD  diazepam (VALIUM) 5 MG tablet Take 5 mg by mouth every  4 (four) hours as needed for anxiety.    Historical Provider, MD  dicyclomine (BENTYL) 10 MG capsule Take 1 capsule (10 mg total) by mouth 3 (three) times daily. 11/16/14   Jessica D Zehr, PA-C  furosemide (LASIX) 40 MG tablet Take 40 mg by mouth.    Historical Provider, MD  glipiZIDE (GLUCOTROL) 5 MG tablet Take 5 mg by mouth daily before breakfast.    Historical Provider, MD  Hypromellose (NATURAL BALANCE TEARS OP) Apply 1 drop to eye every morning.     Historical Provider, MD  Linaclotide Rolan Lipa) 145 MCG CAPS capsule Take 1 capsule (145 mcg total) by mouth daily before breakfast. 11/16/14   Laban Emperor Zehr, PA-C  meclizine (ANTIVERT) 50 MG tablet Take 0.5 tablets (25 mg total) by mouth 4 (four) times daily as needed for dizziness. 12/13/11   Virgel Manifold, MD  meloxicam (MOBIC) 15 MG tablet Take 15 mg by mouth.    Historical Provider, MD   methocarbamol (ROBAXIN) 500 MG tablet Take 500 mg by mouth 2 (two) times daily as needed for muscle spasms.     Historical Provider, MD  metoprolol succinate (TOPROL XL) 50 MG 24 hr tablet Take 50 mg by mouth. 06/05/15   Historical Provider, MD  NUCYNTA ER 100 MG TB12 Take 1 tablet by mouth 3 (three) times daily. 01/16/15   Historical Provider, MD  omeprazole (PRILOSEC) 20 MG capsule Take 1 capsule (20 mg total) by mouth daily. 08/30/13   Ladene Artist, MD  ondansetron (ZOFRAN ODT) 4 MG disintegrating tablet Take 1 tablet (4 mg total) by mouth every 8 (eight) hours as needed for nausea or vomiting. 09/20/14   Alvina Chou, PA-C  OxyCODONE (OXYCONTIN) 80 mg T12A 12 hr tablet Take 80 mg by mouth 3 (three) times daily.    Historical Provider, MD  oxyCODONE-acetaminophen (PERCOCET) 10-325 MG per tablet Take 1 tablet by mouth every 6 (six) hours as needed for pain.     Historical Provider, MD  Polyvinyl Alcohol-Povidone (CLEAR EYES ALL SEASONS) 5-6 MG/ML SOLN Apply 2 drops to eye 3 (three) times daily as needed (drye eyes.).     Historical Provider, MD  potassium chloride SA (K-DUR,KLOR-CON) 20 MEQ tablet Take 1 tablet (20 mEq total) by mouth 2 (two) times daily. 11/07/14   Volanda Napoleon, MD  prochlorperazine (COMPAZINE) 10 MG tablet Take 1 tablet (10 mg total) by mouth every 6 (six) hours as needed for nausea or vomiting. 04/18/14   Volanda Napoleon, MD  Sennosides (LAXATIVE PILLS PO) Take 1 tablet by mouth daily as needed (constipation).    Historical Provider, MD  sildenafil (VIAGRA) 100 MG tablet Take 100 mg by mouth.    Historical Provider, MD  Tamsulosin HCl (FLOMAX) 0.4 MG CAPS Take 0.4 mg by mouth daily.     Historical Provider, MD  triamterene-hydrochlorothiazide (MAXZIDE-25) 37.5-25 MG per tablet Take 1 tablet by mouth daily.      Historical Provider, MD  valACYclovir (VALTREX) 1000 MG tablet Take 1 tablet (1,000 mg total) by mouth 2 (two) times daily. 07/21/15 08/04/15  Quintella Reichert, MD   Vitamin D, Ergocalciferol, (DRISDOL) 50000 UNITS CAPS capsule Take 50,000 Units by mouth every 7 (seven) days.    Historical Provider, MD   BP 135/75 mmHg  Pulse 92  Temp(Src) 98.5 F (36.9 C) (Oral)  Resp 18  SpO2 92% Physical Exam  Constitutional: He is oriented to person, place, and time. He appears well-developed and well-nourished.  HENT:  Head: Normocephalic and atraumatic.  Cardiovascular: Normal rate and regular rhythm.   No murmur heard. Pulmonary/Chest: Effort normal and breath sounds normal. No respiratory distress.  Abdominal: Soft. There is no tenderness. There is no rebound and no guarding.  Musculoskeletal: He exhibits no edema or tenderness.  Significant tenderness to light touch across the right mid to lower scapula and right axillary ribs.  There are a few scattered erythematous vesicles in the region of tenderness  Neurological: He is alert and oriented to person, place, and time.  Skin: Skin is warm and dry.  Psychiatric: He has a normal mood and affect. His behavior is normal.  Nursing note and vitals reviewed.   ED Course  Procedures (including critical care time) Labs Review Labs Reviewed  COMPREHENSIVE METABOLIC PANEL - Abnormal; Notable for the following:    Glucose, Bld 143 (*)    BUN 22 (*)    Creatinine, Ser 2.19 (*)    Calcium 8.8 (*)    Total Protein 8.3 (*)    GFR calc non Af Amer 30 (*)    GFR calc Af Amer 34 (*)    All other components within normal limits  CBC WITH DIFFERENTIAL/PLATELET - Abnormal; Notable for the following:    Hemoglobin 12.8 (*)    HCT 38.7 (*)    MCV 75.0 (*)    MCH 24.8 (*)    All other components within normal limits  URINE CULTURE  URINALYSIS, ROUTINE W REFLEX MICROSCOPIC (NOT AT [facility])    Imaging Review Dg Chest 2 View  07/21/2015  CLINICAL DATA:  Pt c/o right sided subscapular pain, dorsum right sided back pain x 2 wks, denies injury. Hx multiple myeloma EXAM: CHEST  2 VIEW COMPARISON:  05/21/2015 FINDINGS:  Increased density projecting over the spine on the lateral view secondary to prominent osteophytes when correlated with prior chest CT. A right Port-A-Cath terminates at the mid to low SVC. Midline trachea. Borderline cardiomegaly. Tortuous thoracic aorta. No pleural effusion or pneumothorax. Clear right lung. Scarring at the left lung base. IMPRESSION: No acute cardiopulmonary disease. Borderline cardiomegaly with left base scarring. Electronically Signed   By: Abigail Miyamoto M.D.   On: 07/21/2015 16:33   I have personally reviewed and evaluated these images and lab results as part of my medical decision-making.   EKG Interpretation None      MDM   Final diagnoses:  Herpes zoster   Pt here for evaluation of right flank pain.  Exam is c/w zoster.  BMP demonstrates stable renal insufficiency.  UA pending at time of discharge - will send cultures and only treat for infection if cultures are positive.  Presentation is not c/w renal colic, pyelonephritis.  Pt has pain medications available at home (oxycontin and oxycodone).  Discussed outpatient follow up and return precautions.    Quintella Reichert, MD 07/21/15 [phone]

## 2015-07-21 NOTE — Discharge Instructions (Signed)

## 2015-07-21 NOTE — ED Notes (Signed)
Per pt, states he had chemo las Thursday-states now having mid upper back pain

## 2015-07-23 LAB — URINE CULTURE: Culture: NO GROWTH

## 2015-07-29 ENCOUNTER — Ambulatory Visit (HOSPITAL_BASED_OUTPATIENT_CLINIC_OR_DEPARTMENT_OTHER): Payer: Medicare Other

## 2015-07-29 VITALS — BP 130/70 | HR 84 | Temp 98.7°F | Resp 18

## 2015-07-29 DIAGNOSIS — E291 Testicular hypofunction: Secondary | ICD-10-CM | POA: Diagnosis present

## 2015-07-29 DIAGNOSIS — E349 Endocrine disorder, unspecified: Secondary | ICD-10-CM

## 2015-07-29 MED ORDER — TESTOSTERONE CYPIONATE 200 MG/ML IM SOLN
INTRAMUSCULAR | Status: AC
  Filled 2015-07-29: qty 2

## 2015-07-29 MED ORDER — TESTOSTERONE CYPIONATE 200 MG/ML IM SOLN
300.0000 mg | INTRAMUSCULAR | Status: DC
  Administered 2015-07-29: 300 mg via INTRAMUSCULAR

## 2015-07-29 NOTE — Patient Instructions (Signed)

## 2015-08-12 ENCOUNTER — Ambulatory Visit (HOSPITAL_BASED_OUTPATIENT_CLINIC_OR_DEPARTMENT_OTHER): Payer: Medicare Other

## 2015-08-12 VITALS — BP 152/73 | HR 83 | Temp 98.7°F | Resp 16

## 2015-08-12 DIAGNOSIS — E291 Testicular hypofunction: Secondary | ICD-10-CM | POA: Diagnosis not present

## 2015-08-12 DIAGNOSIS — E349 Endocrine disorder, unspecified: Secondary | ICD-10-CM

## 2015-08-12 MED ORDER — TESTOSTERONE CYPIONATE 200 MG/ML IM SOLN
300.0000 mg | INTRAMUSCULAR | Status: DC
Start: 2015-08-12 — End: 2015-08-12
  Administered 2015-08-12: 300 mg via INTRAMUSCULAR

## 2015-08-12 MED ORDER — TESTOSTERONE CYPIONATE 200 MG/ML IM SOLN
INTRAMUSCULAR | Status: AC
  Filled 2015-08-12: qty 2

## 2015-08-12 NOTE — Patient Instructions (Signed)

## 2015-08-26 ENCOUNTER — Ambulatory Visit (HOSPITAL_BASED_OUTPATIENT_CLINIC_OR_DEPARTMENT_OTHER): Payer: Medicare Other

## 2015-08-26 VITALS — BP 157/80 | HR 83 | Temp 98.9°F | Resp 18

## 2015-08-26 DIAGNOSIS — E291 Testicular hypofunction: Secondary | ICD-10-CM | POA: Diagnosis not present

## 2015-08-26 DIAGNOSIS — E349 Endocrine disorder, unspecified: Secondary | ICD-10-CM

## 2015-08-26 MED ORDER — TESTOSTERONE CYPIONATE 200 MG/ML IM SOLN
INTRAMUSCULAR | Status: AC
  Filled 2015-08-26: qty 2

## 2015-08-26 MED ORDER — TESTOSTERONE CYPIONATE 200 MG/ML IM SOLN
300.0000 mg | INTRAMUSCULAR | Status: DC
  Administered 2015-08-26: 300 mg via INTRAMUSCULAR

## 2015-08-26 NOTE — Patient Instructions (Signed)

## 2015-09-09 ENCOUNTER — Ambulatory Visit (HOSPITAL_BASED_OUTPATIENT_CLINIC_OR_DEPARTMENT_OTHER): Payer: Medicare Other

## 2015-09-09 VITALS — BP 159/75 | HR 92 | Temp 98.0°F | Resp 18

## 2015-09-09 DIAGNOSIS — E291 Testicular hypofunction: Secondary | ICD-10-CM

## 2015-09-09 DIAGNOSIS — E349 Endocrine disorder, unspecified: Secondary | ICD-10-CM

## 2015-09-09 MED ORDER — TESTOSTERONE CYPIONATE 200 MG/ML IM SOLN
INTRAMUSCULAR | Status: AC
  Filled 2015-09-09: qty 2

## 2015-09-09 MED ORDER — TESTOSTERONE CYPIONATE 200 MG/ML IM SOLN
300.0000 mg | INTRAMUSCULAR | Status: DC
  Administered 2015-09-09: 300 mg via INTRAMUSCULAR

## 2015-09-09 NOTE — Patient Instructions (Signed)

## 2015-09-16 ENCOUNTER — Ambulatory Visit (HOSPITAL_BASED_OUTPATIENT_CLINIC_OR_DEPARTMENT_OTHER): Payer: Medicare Other

## 2015-09-16 ENCOUNTER — Encounter: Payer: Self-pay | Admitting: Hematology & Oncology

## 2015-09-16 ENCOUNTER — Ambulatory Visit (HOSPITAL_BASED_OUTPATIENT_CLINIC_OR_DEPARTMENT_OTHER): Payer: Medicare Other | Admitting: Hematology & Oncology

## 2015-09-16 ENCOUNTER — Other Ambulatory Visit (HOSPITAL_BASED_OUTPATIENT_CLINIC_OR_DEPARTMENT_OTHER): Payer: Medicare Other

## 2015-09-16 VITALS — BP 141/81 | HR 79 | Temp 99.1°F | Resp 20 | Ht 71.0 in | Wt 198.0 lb

## 2015-09-16 DIAGNOSIS — D509 Iron deficiency anemia, unspecified: Secondary | ICD-10-CM

## 2015-09-16 DIAGNOSIS — D649 Anemia, unspecified: Secondary | ICD-10-CM | POA: Diagnosis not present

## 2015-09-16 DIAGNOSIS — D631 Anemia in chronic kidney disease: Secondary | ICD-10-CM

## 2015-09-16 DIAGNOSIS — N289 Disorder of kidney and ureter, unspecified: Secondary | ICD-10-CM

## 2015-09-16 DIAGNOSIS — C9000 Multiple myeloma not having achieved remission: Secondary | ICD-10-CM | POA: Diagnosis present

## 2015-09-16 DIAGNOSIS — M25511 Pain in right shoulder: Secondary | ICD-10-CM

## 2015-09-16 DIAGNOSIS — E291 Testicular hypofunction: Secondary | ICD-10-CM

## 2015-09-16 DIAGNOSIS — E349 Endocrine disorder, unspecified: Secondary | ICD-10-CM

## 2015-09-16 DIAGNOSIS — M2550 Pain in unspecified joint: Secondary | ICD-10-CM

## 2015-09-16 DIAGNOSIS — N189 Chronic kidney disease, unspecified: Secondary | ICD-10-CM

## 2015-09-16 LAB — CBC WITH DIFFERENTIAL (CANCER CENTER ONLY)
BASO#: 0.1 10*3/uL (ref 0.0–0.2)
BASO%: 0.7 % (ref 0.0–2.0)
EOS ABS: 0.2 10*3/uL (ref 0.0–0.5)
EOS%: 2 % (ref 0.0–7.0)
HEMATOCRIT: 37 % — AB (ref 38.7–49.9)
HEMOGLOBIN: 12.4 g/dL — AB (ref 13.0–17.1)
LYMPH#: 2 10*3/uL (ref 0.9–3.3)
LYMPH%: 22.2 % (ref 14.0–48.0)
MCH: 25.7 pg — AB (ref 28.0–33.4)
MCHC: 33.5 g/dL (ref 32.0–35.9)
MCV: 77 fL — AB (ref 82–98)
MONO#: 0.8 10*3/uL (ref 0.1–0.9)
MONO%: 9.1 % (ref 0.0–13.0)
NEUT%: 66 % (ref 40.0–80.0)
NEUTROS ABS: 5.8 10*3/uL (ref 1.5–6.5)
Platelets: 222 10*3/uL (ref 145–400)
RBC: 4.83 10*6/uL (ref 4.20–5.70)
RDW: 17 % — ABNORMAL HIGH (ref 11.1–15.7)
WBC: 8.8 10*3/uL (ref 4.0–10.0)

## 2015-09-16 LAB — CMP (CANCER CENTER ONLY)
ALK PHOS: 91 U/L — AB (ref 26–84)
ALT: 22 U/L (ref 10–47)
AST: 24 U/L (ref 11–38)
Albumin: 3.5 g/dL (ref 3.3–5.5)
BUN: 18 mg/dL (ref 7–22)
CALCIUM: 9 mg/dL (ref 8.0–10.3)
CO2: 28 mEq/L (ref 18–33)
Chloride: 98 mEq/L (ref 98–108)
Creat: 2.1 mg/dl — ABNORMAL HIGH (ref 0.6–1.2)
Glucose, Bld: 133 mg/dL — ABNORMAL HIGH (ref 73–118)
POTASSIUM: 3.5 meq/L (ref 3.3–4.7)
Sodium: 134 mEq/L (ref 128–145)
TOTAL PROTEIN: 8.1 g/dL (ref 6.4–8.1)
Total Bilirubin: 0.6 mg/dl (ref 0.20–1.60)

## 2015-09-16 LAB — IRON AND TIBC
%SAT: 29 % (ref 20–55)
Iron: 63 ug/dL (ref 42–163)
TIBC: 216 ug/dL (ref 202–409)
UIBC: 153 ug/dL (ref 117–376)

## 2015-09-16 LAB — FERRITIN

## 2015-09-16 LAB — LACTATE DEHYDROGENASE: LDH: 180 U/L (ref 125–245)

## 2015-09-16 MED ORDER — SODIUM CHLORIDE 0.9 % IV SOLN: 60.0000 mg | Freq: Once | INTRAVENOUS | Status: DC

## 2015-09-16 MED ORDER — SODIUM CHLORIDE 0.9 % IV SOLN
60.0000 mg | Freq: Once | INTRAVENOUS | Status: AC
  Administered 2015-09-16: 60 mg via INTRAVENOUS
  Filled 2015-09-16: qty 60

## 2015-09-16 MED ORDER — SODIUM CHLORIDE 0.9% FLUSH
10.0000 mL | INTRAVENOUS | Status: DC | PRN
  Administered 2015-09-16: 10 mL via INTRAVENOUS
  Filled 2015-09-16: qty 10

## 2015-09-16 MED ORDER — HEPARIN SOD (PORK) LOCK FLUSH 100 UNIT/ML IV SOLN
500.0000 [IU] | Freq: Once | INTRAVENOUS | Status: AC
  Administered 2015-09-16: 500 [IU] via INTRAVENOUS
  Filled 2015-09-16: qty 5

## 2015-09-16 NOTE — Progress Notes (Signed)
Hematology and Oncology Follow Up Visit  [name] [id number] [birth year] 65 y.o. 09/16/2015   Principle Diagnosis:  1. IgG kappa myeloma. 2. Anemia secondary to renal insufficiency. 3. Intermittent iron-deficiency anemia. 4. Hypotestosteronemia  Current Therapy:   1. Aredia 60 mg IV q.2 months  2. Aranesp 300 mcg subcu as needed for hemoglobin less than 11. 3. Depo-Testosterone 300 mg q.2 weeks. 4. IV iron as indicated.     Interim History:  [name] is back for followup. He is doing a little bit better. 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 He now is on mesalamine. He goes back to the gastroenterologist for another visit I think a week or so.  His labs have been holding pretty steady. His last M spike was almost a 1.0 g/L. His IgG level was 2082 mg/dL. His Kappa Lightchain was 4.4 mg/dL.   He does have fibromyalgia. This is causing some issues. His right shoulder does not seem to be bothering him as much.  We did a plain x-ray of his right shoulder when I saw him. He is some chronic AC joint degeneration.  He has had no rashes. He has had no cough. There's been no leg swelling. He's had no bleeding or bruising. He never has had bright red blood per rectum.  His iron studies have been pretty well. The last time that he received IV iron was back in December 2015. When we saw him back in May, his ferritin was 2026. His iron saturation was 55%.    The last time he got Aranesp was back in September 2015.   His testosterone that we give him does seem to make a difference. He does seem to have more energy with testosterone. Back in May, his testosterone level was 191.  He's had no bleeding. He's had no change in bowel or bladder habits.  Overall, his performance status is ECOG 1.   Medications:  Current Outpatient Prescriptions:  .  amLODipine (NORVASC) 10 MG tablet, Take 10 mg by mouth daily., Disp: , Rfl:  .  CARAFATE  1 GM/10ML suspension, Take 1 g by mouth 4 (four) times daily. , Disp: , Rfl:  .  diazepam (VALIUM) 5 MG tablet, Take 5 mg by mouth every 4 (four) hours as needed for anxiety., Disp: , Rfl:  .  dicyclomine (BENTYL) 10 MG capsule, Take 1 capsule (10 mg total) by mouth 3 (three) times daily., Disp: 90 capsule, Rfl: 2 .  furosemide (LASIX) 40 MG tablet, Take 40 mg by mouth., Disp: , Rfl:  .  glipiZIDE (GLUCOTROL) 5 MG tablet, Take 5 mg by mouth daily before breakfast., Disp: , Rfl:  .  Hypromellose (NATURAL BALANCE TEARS OP), Apply 1 drop to eye every morning. , Disp: , Rfl:  .  Linaclotide (LINZESS) 145 MCG CAPS capsule, Take 1 capsule (145 mcg total) by mouth daily before breakfast., Disp: 30 capsule, Rfl: 2 .  meclizine (ANTIVERT) 50 MG tablet, Take 0.5 tablets (25 mg total) by mouth 4 (four) times daily as needed for dizziness., Disp: 30 tablet, Rfl: 0 .  meloxicam (MOBIC) 15 MG tablet, Take 15 mg by mouth., Disp: , Rfl:  .  mesalamine (LIALDA) 1.2 g EC tablet, Take 1,200 mg by mouth., Disp: , Rfl:  .  methocarbamol (ROBAXIN) 500 MG tablet, Take 500 mg by mouth 2 (two) times daily as needed for muscle spasms. , Disp: , Rfl:  .  metoprolol succinate (TOPROL XL)  50 MG 24 hr tablet, Take 50 mg by mouth., Disp: , Rfl:  .  NUCYNTA ER 100 MG TB12, Take 1 tablet by mouth 3 (three) times daily., Disp: , Rfl: 0 .  omeprazole (PRILOSEC) 20 MG capsule, Take 1 capsule (20 mg total) by mouth daily., Disp: 30 capsule, Rfl: 11 .  ondansetron (ZOFRAN ODT) 4 MG disintegrating tablet, Take 1 tablet (4 mg total) by mouth every 8 (eight) hours as needed for nausea or vomiting., Disp: 10 tablet, Rfl: 0 .  OxyCODONE (OXYCONTIN) 80 mg T12A 12 hr tablet, Take 80 mg by mouth 3 (three) times daily., Disp: , Rfl:  .  oxyCODONE-acetaminophen (PERCOCET) 10-325 MG per tablet, Take 1 tablet by mouth every 6 (six) hours as needed for pain. , Disp: , Rfl:  .  Polyvinyl Alcohol-Povidone (CLEAR EYES ALL SEASONS) 5-6 MG/ML SOLN, Apply  2 drops to eye 3 (three) times daily as needed (drye eyes.). , Disp: , Rfl:  .  potassium chloride SA (K-DUR,KLOR-CON) 20 MEQ tablet, Take 1 tablet (20 mEq total) by mouth 2 (two) times daily., Disp: 60 tablet, Rfl: 6 .  prochlorperazine (COMPAZINE) 10 MG tablet, Take 1 tablet (10 mg total) by mouth every 6 (six) hours as needed for nausea or vomiting., Disp: 60 tablet, Rfl: 2 .  Sennosides (LAXATIVE PILLS PO), Take 1 tablet by mouth daily as needed (constipation)., Disp: , Rfl:  .  sildenafil (VIAGRA) 100 MG tablet, Take 100 mg by mouth., Disp: , Rfl:  .  Tamsulosin HCl (FLOMAX) 0.4 MG CAPS, Take 0.4 mg by mouth daily. , Disp: , Rfl:  .  triamterene-hydrochlorothiazide (MAXZIDE-25) 37.5-25 MG per tablet, Take 1 tablet by mouth daily.  , Disp: , Rfl:  .  Vitamin D, Ergocalciferol, (DRISDOL) 50000 UNITS CAPS capsule, Take 50,000 Units by mouth every 7 (seven) days., Disp: , Rfl:  No current facility-administered medications for this visit.   Facility-Administered Medications Ordered in Other Visits:  .  morphine 4 MG/ML injection 4 mg, 4 mg, Intravenous, Q4H PRN, Volanda Napoleon, MD  Allergies:  Allergies  Allergen Reactions  . Iodine Anxiety and Rash    Didn't feel right "instructed not to take per MD--something with his port"    Past Medical History, Surgical history, Social history, and Family History were reviewed and updated.  Review of Systems: As above  Physical Exam:  height is 5\' 11"  (1.803 m) and weight is 198 lb (89.8 kg). His oral temperature is 99.1 F (37.3 C). His blood pressure is 141/81 (abnormal) and his pulse is 79. His respiration is 20.   His head and neck exam shows no ocular or oral lesions. He has no adenopathy in the neck. His thyroid is non-palpable. Lungs are clear. Cardiac exam regular rate and rhythm with no murmurs, rubs or bruits.. Abdomen is soft. His abdomen is slightly distended. He has good bowel sounds. There is no fluid wave. There is no palpable  liver or spleen tip. Back exam shows no tenderness in the lumbar spine. There is no paravertebral muscle spasms. . He has slightly decreased range of motion of the lower back. Extremities shows no clubbing cyanosis or edema.He does have a swelling in the posterior right shoulder. There is some tenderness in this area to palpation. He has fairly decent range of motion of the right arm. He has decent strength in his legs. Skin exam shows no rashes. Neurological exam is nonfocal.  Lab Results  Component Value Date   WBC 8.8 09/16/2015  HGB 12.4 (L) 09/16/2015   HCT 37.0 (L) 09/16/2015   MCV 77 (L) 09/16/2015   PLT 222 09/16/2015     Chemistry      Component Value Date/Time   NA 135 07/21/2015 1620   NA 137 07/15/2015 0759   K 4.2 07/21/2015 1620   K 3.9 07/15/2015 0759   CL 103 07/21/2015 1620   CL 101 07/15/2015 0759   CO2 23 07/21/2015 1620   CO2 28 07/15/2015 0759   BUN 22 (H) 07/21/2015 1620   BUN 28 (H) 07/15/2015 0759   CREATININE 2.19 (H) 07/21/2015 1620   CREATININE 2.2 (H) 07/15/2015 0759      Component Value Date/Time   CALCIUM 8.8 (L) 07/21/2015 1620   CALCIUM 9.6 07/15/2015 0759   ALKPHOS 86 07/21/2015 1620   ALKPHOS 83 07/15/2015 0759   AST 28 07/21/2015 1620   AST 32 07/15/2015 0759   ALT 33 07/21/2015 1620   ALT 39 07/15/2015 0759   BILITOT 0.7 07/21/2015 1620   BILITOT 0.60 07/15/2015 0759         Impression and Plan: [name] is a 65 year old gentleman. He has multiple medical problems. I still do not think that myeloma is an issue. With his monoclonal spike: Steady, I still do not think there is any indication that his myeloma needs to be treated.  It will be interested to see how he does with the colitis. I suppose a similar the arthritic issues he has might be a paraneoplastic-type process from the colitis.   He has fibromyalgia. He has diabetes. He has low testosterone.   He will get his Aredia today.   I will plan to get back to see me in 2  more months.  Volanda Napoleon, MD 7/24/20179:01 AM

## 2015-09-16 NOTE — Patient Instructions (Signed)
Pamidronate injection What is this medicine? PAMIDRONATE (pa mi DROE nate) slows calcium loss from bones. It is used to treat high calcium blood levels from cancer or Paget's disease. It is also used to treat bone pain and prevent fractures from certain cancers that have spread to the bone. This medicine may be used for other purposes; ask your health care provider or pharmacist if you have questions. What should I tell my health care provider before I take this medicine? They need to know if you have any of these conditions: -aspirin-sensitive asthma -dental disease -kidney disease -an unusual or allergic reaction to pamidronate, other medicines, foods, dyes, or preservatives -pregnant or trying to get pregnant -breast-feeding How should I use this medicine? This medicine is for infusion into a vein. It is given by a health care professional in a hospital or clinic setting. Talk to your pediatrician regarding the use of this medicine in children. This medicine is not approved for use in children. Overdosage: If you think you have taken too much of this medicine contact a poison control center or emergency room at once. NOTE: This medicine is only for you. Do not share this medicine with others. What if I miss a dose? This does not apply. What may interact with this medicine? -certain antibiotics given by injection -medicines for inflammation or pain like ibuprofen, naproxen -some diuretics like bumetanide, furosemide -cyclosporine -parathyroid hormone -tacrolimus -teriparatide -thalidomide This list may not describe all possible interactions. Give your health care provider a list of all the medicines, herbs, non-prescription drugs, or dietary supplements you use. Also tell them if you smoke, drink alcohol, or use illegal drugs. Some items may interact with your medicine. What should I watch for while using this medicine? Visit your doctor or health care professional for regular  checkups. It may be some time before you see the benefit from this medicine. Do not stop taking your medicine unless your doctor tells you to. Your doctor may order blood tests or other tests to see how you are doing. Women should inform their doctor if they wish to become pregnant or think they might be pregnant. There is a potential for serious side effects to an unborn child. Talk to your health care professional or pharmacist for more information. You should make sure that you get enough calcium and vitamin D while you are taking this medicine. Discuss the foods you eat and the vitamins you take with your health care professional. Some people who take this medicine have severe bone, joint, and/or muscle pain. This medicine may also increase your risk for a broken thigh bone. Tell your doctor right away if you have pain in your upper leg or groin. Tell your doctor if you have any pain that does not go away or that gets worse. What side effects may I notice from receiving this medicine? Side effects that you should report to your doctor or health care professional as soon as possible: -allergic reactions like skin rash, itching or hives, swelling of the face, lips, or tongue -black or tarry stools -changes in vision -eye inflammation, pain -high blood pressure -jaw pain, especially burning or cramping -muscle weakness -numb, tingling pain -swelling of feet or hands -trouble passing urine or change in the amount of urine -unable to move easily Side effects that usually do not require medical attention (report to your doctor or health care professional if they continue or are bothersome): -bone, joint, or muscle pain -constipation -dizzy, drowsy -fever -headache -loss of  appetite -nausea, vomiting -pain at site where injected This list may not describe all possible side effects. Call your doctor for medical advice about side effects. You may report side effects to FDA at  1-800-FDA-1088. Where should I keep my medicine? This drug is given in a hospital or clinic and will not be stored at home. NOTE: This sheet is a summary. It may not cover all possible information. If you have questions about this medicine, talk to your doctor, pharmacist, or health care provider.    2016, Elsevier/Gold Standard. (2010-08-08 08:49:49)  

## 2015-09-17 LAB — IGG, IGA, IGM
IGA/IMMUNOGLOBULIN A, SERUM: 307 mg/dL (ref 61–437)
IgM, Qn, Serum: 71 mg/dL (ref 20–172)

## 2015-09-17 LAB — KAPPA/LAMBDA LIGHT CHAINS
Ig Kappa Free Light Chain: 44.6 mg/L — ABNORMAL HIGH (ref 3.3–19.4)
Ig Lambda Free Light Chain: 31.5 mg/L — ABNORMAL HIGH (ref 5.7–26.3)
KAPPA/LAMBDA FLC RATIO: 1.42 (ref 0.26–1.65)

## 2015-09-18 LAB — PROTEIN ELECTROPHORESIS, SERUM, WITH REFLEX
A/G Ratio: 0.9 (ref 0.7–1.7)
ALPHA 2: 1.1 g/dL — AB (ref 0.4–1.0)
Albumin: 3.7 g/dL (ref 2.9–4.4)
Alpha 1: 0.2 g/dL (ref 0.0–0.4)
BETA: 1 g/dL (ref 0.7–1.3)
GAMMA GLOBULIN: 1.9 g/dL — AB (ref 0.4–1.8)
GLOBULIN, TOTAL: 4.1 g/dL — AB (ref 2.2–3.9)
Interpretation(See Below): 0
M-SPIKE, %: 0.7 g/dL — AB
Total Protein: 7.8 g/dL (ref 6.0–8.5)

## 2015-09-23 ENCOUNTER — Ambulatory Visit (HOSPITAL_BASED_OUTPATIENT_CLINIC_OR_DEPARTMENT_OTHER): Payer: Medicare Other

## 2015-09-23 DIAGNOSIS — E291 Testicular hypofunction: Secondary | ICD-10-CM | POA: Diagnosis not present

## 2015-09-23 DIAGNOSIS — E349 Endocrine disorder, unspecified: Secondary | ICD-10-CM

## 2015-09-23 MED ORDER — TESTOSTERONE CYPIONATE 200 MG/ML IM SOLN
300.0000 mg | INTRAMUSCULAR | Status: DC
  Administered 2015-09-23: 300 mg via INTRAMUSCULAR

## 2015-09-23 MED ORDER — TESTOSTERONE CYPIONATE 200 MG/ML IM SOLN
INTRAMUSCULAR | Status: AC
  Filled 2015-09-23: qty 2

## 2015-09-23 NOTE — Patient Instructions (Signed)

## 2015-10-07 ENCOUNTER — Ambulatory Visit (HOSPITAL_BASED_OUTPATIENT_CLINIC_OR_DEPARTMENT_OTHER): Payer: Medicare Other

## 2015-10-07 VITALS — BP 156/73 | HR 76 | Temp 98.4°F | Resp 20

## 2015-10-07 DIAGNOSIS — E291 Testicular hypofunction: Secondary | ICD-10-CM

## 2015-10-07 DIAGNOSIS — E349 Endocrine disorder, unspecified: Secondary | ICD-10-CM

## 2015-10-07 MED ORDER — TESTOSTERONE CYPIONATE 200 MG/ML IM SOLN
INTRAMUSCULAR | Status: AC
  Filled 2015-10-07: qty 2

## 2015-10-07 MED ORDER — TESTOSTERONE CYPIONATE 200 MG/ML IM SOLN
300.0000 mg | INTRAMUSCULAR | Status: DC
  Administered 2015-10-07: 300 mg via INTRAMUSCULAR

## 2015-10-07 NOTE — Patient Instructions (Signed)

## 2015-10-21 ENCOUNTER — Ambulatory Visit (HOSPITAL_BASED_OUTPATIENT_CLINIC_OR_DEPARTMENT_OTHER): Payer: Medicare Other

## 2015-10-21 VITALS — BP 167/74 | HR 76 | Temp 98.5°F | Resp 20

## 2015-10-21 DIAGNOSIS — E291 Testicular hypofunction: Secondary | ICD-10-CM

## 2015-10-21 DIAGNOSIS — E349 Endocrine disorder, unspecified: Secondary | ICD-10-CM

## 2015-10-21 MED ORDER — TESTOSTERONE CYPIONATE 200 MG/ML IM SOLN
INTRAMUSCULAR | Status: AC
  Filled 2015-10-21: qty 2

## 2015-10-21 MED ORDER — TESTOSTERONE CYPIONATE 200 MG/ML IM SOLN
300.0000 mg | INTRAMUSCULAR | Status: DC
  Administered 2015-10-21: 300 mg via INTRAMUSCULAR

## 2015-10-21 NOTE — Patient Instructions (Signed)

## 2015-11-04 ENCOUNTER — Ambulatory Visit (HOSPITAL_BASED_OUTPATIENT_CLINIC_OR_DEPARTMENT_OTHER): Payer: Medicare Other

## 2015-11-04 VITALS — BP 160/74 | HR 89 | Temp 98.3°F | Resp 20

## 2015-11-04 DIAGNOSIS — E291 Testicular hypofunction: Secondary | ICD-10-CM | POA: Diagnosis not present

## 2015-11-04 DIAGNOSIS — E349 Endocrine disorder, unspecified: Secondary | ICD-10-CM

## 2015-11-04 MED ORDER — TESTOSTERONE CYPIONATE 200 MG/ML IM SOLN
INTRAMUSCULAR | Status: AC
  Filled 2015-11-04: qty 2

## 2015-11-04 MED ORDER — TESTOSTERONE CYPIONATE 200 MG/ML IM SOLN
300.0000 mg | INTRAMUSCULAR | Status: DC
  Administered 2015-11-04: 300 mg via INTRAMUSCULAR

## 2015-11-04 NOTE — Patient Instructions (Signed)
Testosterone Testosterone is a hormone made by the male's testicles and by the adrenal glands, which are a pair of glands on top of the kidneys. Starting at puberty, testosterone stimulates the development of secondary sex characteristics. This includes a deeper voice, growth of muscles and body hair, and penis enlargement.  Females also produce testosterone in both the adrenal glands and ovaries. A male's body converts testosterone into estradiol, the main male sex hormone. An abnormal level of testosterone can cause health issues in both males and females. You may have this test if your health care provider suspects that an abnormal testosterone level is causing or contributing to other health problems. In males, symptoms of an abnormal testosterone level include:  Infertility.  Erectile dysfunction.  Delayed puberty or premature puberty. In females, symptoms of an abnormally high testosterone level include:  Infertility.  Polycystic ovarian syndrome (PCOS).  Developing masculine features (virilization). This test requires a blood sample taken from a vein in your arm or hand. The sample for this test is usually collected in the morning. The amount of testosterone in your blood is highest at that time. RESULTS It is your responsibility to obtain your test results. Ask the lab or department performing the test when and how you will get your results. Contact your health care provider to discuss any questions you have about your results.  The result of a blood test for testosterone will be given as a range of values. A testosterone level that is outside the normal range may indicate a health problem. Testosterone is measured in nanograms per deciliter (ng/dL). Range of Normal Values Ranges for normal values may vary among different labs and hospitals. You should always check with your health care provider after having lab work or other tests done to discuss whether your values are considered  within normal limits. Normal levels of total testosterone are as follows:  Male:  7 months to 66 years old: less than 30 ng/dL.  17-22 years old: less than 300 ng/dL.  61-109 years old: 170-540 ng/dL.  39-3 years old: 250-910 ng/dL.  66 years old and over: 280-1,080 ng/dL.  Male:  7 months to 65 years old: less than 30 ng/dL.  49-81 years old: less than 40 ng/dL.  10-12 years old: less than 60 ng/dL.  67-37 years old: less than 70 ng/dL.  66 years old and over: less than 70 ng/dL. Meaning of Results Outside Normal Value Ranges A testosterone level that is too low or too high can indicate a number of health problems. In males:  A high testosterone level can occur if you:  Have certain types of tumors.  Have an overactive thyroid gland (hyperthyroidism).  Use anabolic steroids.  Are starting puberty early (precocious puberty).  Have an inherited disorder that affects the adrenal glands (congenital adrenal hyperplasia).  A low testosterone level can occur if you:  Have certain genetic diseases.  Have had certain viral infections, such as mumps.  Have pituitary disease.  Have had an injury to the testicles.  Are an alcoholic. In females:  A high testosterone level can occur if you have:  Certain types of tumors.  An inherited disorder that affects certain cells in the adrenal glands (congenital adrenocortical hyperplasia).  PCOS.  A low testosterone level does not cause health problems. Discuss the results of your testosterone test with your health care provider. Your health care provider will use the results of this test and other tests to make a diagnosis.   This information  is not intended to replace advice given to you by your health care provider. Make sure you discuss any questions you have with your health care provider.   Document Released: 02/27/2004 Document Revised: 03/02/2014 Document Reviewed: 06/07/2013 Elsevier Interactive Patient  Education [employer].

## 2015-11-18 ENCOUNTER — Ambulatory Visit (HOSPITAL_BASED_OUTPATIENT_CLINIC_OR_DEPARTMENT_OTHER): Payer: Medicare Other

## 2015-11-18 ENCOUNTER — Encounter: Payer: Self-pay | Admitting: Hematology & Oncology

## 2015-11-18 ENCOUNTER — Ambulatory Visit: Payer: Self-pay

## 2015-11-18 ENCOUNTER — Ambulatory Visit (HOSPITAL_BASED_OUTPATIENT_CLINIC_OR_DEPARTMENT_OTHER): Payer: Medicare Other | Admitting: Hematology & Oncology

## 2015-11-18 ENCOUNTER — Other Ambulatory Visit (HOSPITAL_BASED_OUTPATIENT_CLINIC_OR_DEPARTMENT_OTHER): Payer: Medicare Other

## 2015-11-18 VITALS — BP 171/77 | HR 78 | Temp 99.1°F | Resp 16 | Ht 71.0 in | Wt 199.0 lb

## 2015-11-18 DIAGNOSIS — E291 Testicular hypofunction: Secondary | ICD-10-CM | POA: Diagnosis not present

## 2015-11-18 DIAGNOSIS — D509 Iron deficiency anemia, unspecified: Secondary | ICD-10-CM

## 2015-11-18 DIAGNOSIS — C9000 Multiple myeloma not having achieved remission: Secondary | ICD-10-CM

## 2015-11-18 DIAGNOSIS — Z23 Encounter for immunization: Secondary | ICD-10-CM | POA: Diagnosis not present

## 2015-11-18 DIAGNOSIS — E349 Endocrine disorder, unspecified: Secondary | ICD-10-CM

## 2015-11-18 DIAGNOSIS — D649 Anemia, unspecified: Secondary | ICD-10-CM | POA: Diagnosis not present

## 2015-11-18 DIAGNOSIS — N289 Disorder of kidney and ureter, unspecified: Secondary | ICD-10-CM

## 2015-11-18 DIAGNOSIS — N189 Chronic kidney disease, unspecified: Secondary | ICD-10-CM

## 2015-11-18 DIAGNOSIS — D631 Anemia in chronic kidney disease: Secondary | ICD-10-CM

## 2015-11-18 LAB — CMP (CANCER CENTER ONLY)
ALBUMIN: 3.8 g/dL (ref 3.3–5.5)
ALK PHOS: 91 U/L — AB (ref 26–84)
ALT(SGPT): 35 U/L (ref 10–47)
AST: 36 U/L (ref 11–38)
BUN, Bld: 17 mg/dL (ref 7–22)
CHLORIDE: 99 meq/L (ref 98–108)
CO2: 30 mEq/L (ref 18–33)
CREATININE: 2.1 mg/dL — AB (ref 0.6–1.2)
Calcium: 9 mg/dL (ref 8.0–10.3)
Glucose, Bld: 139 mg/dL — ABNORMAL HIGH (ref 73–118)
POTASSIUM: 3.3 meq/L (ref 3.3–4.7)
Sodium: 136 mEq/L (ref 128–145)
TOTAL PROTEIN: 8.2 g/dL — AB (ref 6.4–8.1)
Total Bilirubin: 0.7 mg/dl (ref 0.20–1.60)

## 2015-11-18 LAB — IRON AND TIBC
%SAT: 24 % (ref 20–55)
IRON: 55 ug/dL (ref 42–163)
TIBC: 224 ug/dL (ref 202–409)
UIBC: 169 ug/dL (ref 117–376)

## 2015-11-18 LAB — CBC WITH DIFFERENTIAL (CANCER CENTER ONLY)
BASO#: 0.1 10*3/uL (ref 0.0–0.2)
BASO%: 1.1 % (ref 0.0–2.0)
EOS ABS: 0.3 10*3/uL (ref 0.0–0.5)
EOS%: 3.8 % (ref 0.0–7.0)
HCT: 37.3 % — ABNORMAL LOW (ref 38.7–49.9)
HEMOGLOBIN: 12.9 g/dL — AB (ref 13.0–17.1)
LYMPH#: 1.8 10*3/uL (ref 0.9–3.3)
LYMPH%: 23.1 % (ref 14.0–48.0)
MCH: 26 pg — AB (ref 28.0–33.4)
MCHC: 34.6 g/dL (ref 32.0–35.9)
MCV: 75 fL — AB (ref 82–98)
MONO#: 0.9 10*3/uL (ref 0.1–0.9)
MONO%: 12 % (ref 0.0–13.0)
NEUT%: 60 % (ref 40.0–80.0)
NEUTROS ABS: 4.5 10*3/uL (ref 1.5–6.5)
Platelets: 257 10*3/uL (ref 145–400)
RBC: 4.96 10*6/uL (ref 4.20–5.70)
RDW: 14.8 % (ref 11.1–15.7)
WBC: 7.6 10*3/uL (ref 4.0–10.0)

## 2015-11-18 LAB — FERRITIN

## 2015-11-18 MED ORDER — KETOROLAC TROMETHAMINE 15 MG/ML IJ SOLN
INTRAMUSCULAR | Status: AC
  Filled 2015-11-18: qty 2

## 2015-11-18 MED ORDER — PAMIDRONATE DISODIUM 30 MG/10ML IV SOLN
60.0000 mg | Freq: Once | INTRAVENOUS | Status: AC
  Administered 2015-11-18: 60 mg via INTRAVENOUS
  Filled 2015-11-18: qty 10

## 2015-11-18 MED ORDER — TESTOSTERONE CYPIONATE 200 MG/ML IM SOLN
INTRAMUSCULAR | Status: AC
  Filled 2015-11-18: qty 2

## 2015-11-18 MED ORDER — SODIUM CHLORIDE 0.9 % IV SOLN: 60.0000 mg | Freq: Once | INTRAVENOUS | Status: DC

## 2015-11-18 MED ORDER — INFLUENZA VAC SPLIT QUAD 0.5 ML IM SUSY
0.5000 mL | PREFILLED_SYRINGE | Freq: Once | INTRAMUSCULAR | Status: AC
  Administered 2015-11-18: 0.5 mL via INTRAMUSCULAR
  Filled 2015-11-18: qty 0.5

## 2015-11-18 MED ORDER — TESTOSTERONE CYPIONATE 200 MG/ML IM SOLN
300.0000 mg | INTRAMUSCULAR | Status: DC
  Administered 2015-11-18: 300 mg via INTRAMUSCULAR

## 2015-11-18 MED ORDER — KETOROLAC TROMETHAMINE 15 MG/ML IJ SOLN
30.0000 mg | Freq: Once | INTRAMUSCULAR | Status: AC
  Administered 2015-11-18: 30 mg via INTRAVENOUS
  Filled 2015-11-18: qty 1

## 2015-11-18 MED ORDER — DAPTOMYCIN 500 MG IV SOLR
500.0000 mg | INTRAVENOUS | Status: AC
  Administered 2015-11-18: 500 mg via INTRAVENOUS
  Filled 2015-11-18: qty 10

## 2015-11-18 MED ORDER — SODIUM CHLORIDE 0.9 % IV SOLN
INTRAVENOUS | Status: DC
  Administered 2015-11-18: 10:00:00 via INTRAVENOUS

## 2015-11-18 NOTE — Patient Instructions (Signed)
Testosterone Testosterone is a hormone made by the male's testicles and by the adrenal glands, which are a pair of glands on top of the kidneys. Starting at puberty, testosterone stimulates the development of secondary sex characteristics. This includes a deeper voice, growth of muscles and body hair, and penis enlargement.  Females also produce testosterone in both the adrenal glands and ovaries. A male's body converts testosterone into estradiol, the main male sex hormone. An abnormal level of testosterone can cause health issues in both males and females. You may have this test if your health care provider suspects that an abnormal testosterone level is causing or contributing to other health problems. In males, symptoms of an abnormal testosterone level include:  Infertility.  Erectile dysfunction.  Delayed puberty or premature puberty. In females, symptoms of an abnormally high testosterone level include:  Infertility.  Polycystic ovarian syndrome (PCOS).  Developing masculine features (virilization). This test requires a blood sample taken from a vein in your arm or hand. The sample for this test is usually collected in the morning. The amount of testosterone in your blood is highest at that time. RESULTS It is your responsibility to obtain your test results. Ask the lab or department performing the test when and how you will get your results. Contact your health care provider to discuss any questions you have about your results.  The result of a blood test for testosterone will be given as a range of values. A testosterone level that is outside the normal range may indicate a health problem. Testosterone is measured in nanograms per deciliter (ng/dL). Range of Normal Values Ranges for normal values may vary among different labs and hospitals. You should always check with your health care provider after having lab work or other tests done to discuss whether your values are considered  within normal limits. Normal levels of total testosterone are as follows:  Male:  7 months to 66 years old: less than 30 ng/dL.  94-8 years old: less than 300 ng/dL.  30-48 years old: 170-540 ng/dL.  39-32 years old: 250-910 ng/dL.  66 years old and over: 280-1,080 ng/dL.  Male:  7 months to 66 years old: less than 30 ng/dL.  59-42 years old: less than 40 ng/dL.  52-70 years old: less than 60 ng/dL.  79-58 years old: less than 70 ng/dL.  66 years old and over: less than 70 ng/dL. Meaning of Results Outside Normal Value Ranges A testosterone level that is too low or too high can indicate a number of health problems. In males:  A high testosterone level can occur if you:  Have certain types of tumors.  Have an overactive thyroid gland (hyperthyroidism).  Use anabolic steroids.  Are starting puberty early (precocious puberty).  Have an inherited disorder that affects the adrenal glands (congenital adrenal hyperplasia).  A low testosterone level can occur if you:  Have certain genetic diseases.  Have had certain viral infections, such as mumps.  Have pituitary disease.  Have had an injury to the testicles.  Are an alcoholic. In females:  A high testosterone level can occur if you have:  Certain types of tumors.  An inherited disorder that affects certain cells in the adrenal glands (congenital adrenocortical hyperplasia).  PCOS.  A low testosterone level does not cause health problems. Discuss the results of your testosterone test with your health care provider. Your health care provider will use the results of this test and other tests to make a diagnosis.   This information  is not intended to replace advice given to you by your health care provider. Make sure you discuss any questions you have with your health care provider.   Document Released: 02/27/2004 Document Revised: 03/02/2014 Document Reviewed: 06/07/2013 Elsevier Interactive Patient  Education 2016 Else[employer].   Pamidronate injection What is this medicine? PAMIDRONATE (pa mi DROE nate) slows calcium loss from bones. It is used to treat high calcium blood levels from cancer or Paget's disease. It is also used to treat bone pain and prevent fractures from certain cancers that have spread to the bone. This medicine may be used for other purposes; ask your health care provider or pharmacist if you have questions. What should I tell my health care provider before I take this medicine? They need to know if you have any of these conditions: -aspirin-sensitive asthma -dental disease -kidney disease -an unusual or allergic reaction to pamidronate, other medicines, foods, dyes, or preservatives -pregnant or trying to get pregnant -breast-feeding How should I use this medicine? This medicine is for infusion into a vein. It is given by a health care professional in a hospital or clinic setting. Talk to your pediatrician regarding the use of this medicine in children. This medicine is not approved for use in children. Overdosage: If you think you have taken too much of this medicine contact a poison control center or emergency room at once. NOTE: This medicine is only for you. Do not share this medicine with others. What if I miss a dose? This does not apply. What may interact with this medicine? -certain antibiotics given by injection -medicines for inflammation or pain like ibuprofen, naproxen -some diuretics like bumetanide, furosemide -cyclosporine -parathyroid hormone -tacrolimus -teriparatide -thalidomide This list may not describe all possible interactions. Give your health care provider a list of all the medicines, herbs, non-prescription drugs, or dietary supplements you use. Also tell them if you smoke, drink alcohol, or use illegal drugs. Some items may interact with your medicine. What should I watch for while using this medicine? Visit your doctor or health  care professional for regular checkups. It may be some time before you see the benefit from this medicine. Do not stop taking your medicine unless your doctor tells you to. Your doctor may order blood tests or other tests to see how you are doing. Women should inform their doctor if they wish to become pregnant or think they might be pregnant. There is a potential for serious side effects to an unborn child. Talk to your health care professional or pharmacist for more information. You should make sure that you get enough calcium and vitamin D while you are taking this medicine. Discuss the foods you eat and the vitamins you take with your health care professional. Some people who take this medicine have severe bone, joint, and/or muscle pain. This medicine may also increase your risk for a broken thigh bone. Tell your doctor right away if you have pain in your upper leg or groin. Tell your doctor if you have any pain that does not go away or that gets worse. What side effects may I notice from receiving this medicine? Side effects that you should report to your doctor or health care professional as soon as possible: -allergic reactions like skin rash, itching or hives, swelling of the face, lips, or tongue -black or tarry stools -changes in vision -eye inflammation, pain -high blood pressure -jaw pain, especially burning or cramping -muscle weakness -numb, tingling pain -swelling of feet or hands -trouble  passing urine or change in the amount of urine -unable to move easily Side effects that usually do not require medical attention (report to your doctor or health care professional if they continue or are bothersome): -bone, joint, or muscle pain -constipation -dizzy, drowsy -fever -headache -loss of appetite -nausea, vomiting -pain at site where injected This list may not describe all possible side effects. Call your doctor for medical advice about side effects. You may report side effects  to FDA at 1-800-FDA-1088. Where should I keep my medicine? This drug is given in a hospital or clinic and will not be stored at home. NOTE: This sheet is a summary. It may not cover all possible information. If you have questions about this medicine, talk to your doctor, pharmacist, or health care provider.    2016, Elsevier/Gold Standard. (2010-08-08 08:49:49)   Daptomycin injection What is this medicine? DAPTOMYCIN (DAP toe MYE sin) is a lipopeptide antibiotic. It is used to treat certain kinds of bacterial infections. It will not work for colds, flu, or other viral infections. This medicine may be used for other purposes; ask your health care provider or pharmacist if you have questions. What should I tell my health care provider before I take this medicine? They need to know if you have any of these conditions: -kidney disease -an unusual or allergic reaction to daptomycin, other medicines, foods, dyes, or preservatives -pregnant or trying to get pregnant -breast-feeding How should I use this medicine? This medicine is for infusion into a vein. It is usually given by a health care professional in a hospital or clinic setting. If you get this medicine at home, you will be taught how to prepare and give this medicine. Use exactly as directed. Take your medicine at regular intervals. Do not take your medicine more often than directed. Take all of your medicine as directed even if you think you are better. Do not skip doses or stop your medicine early. It is important that you put your used needles and syringes in a special sharps container. Do not put them in a trash can. If you do not have a sharps container, call your pharmacist or healthcare provider to get one. Talk to your pediatrician regarding the use of this medicine in children. Special care may be needed. Overdosage: If you think you have taken too much of this medicine contact a poison control center or emergency room at  once. NOTE: This medicine is only for you. Do not share this medicine with others. What if I miss a dose? If you miss a dose, take it as soon as you can. If it is almost time for your next dose, take only that dose. Do not take double or extra doses. What may interact with this medicine? -birth control pills -some antibiotics like tobramycin This list may not describe all possible interactions. Give your health care provider a list of all the medicines, herbs, non-prescription drugs, or dietary supplements you use. Also tell them if you smoke, drink alcohol, or use illegal drugs. Some items may interact with your medicine. What should I watch for while using this medicine? Your condition will be monitored carefully while you are receiving this medicine. Do not treat diarrhea with over the counter products. Contact your doctor if you have diarrhea that lasts more than 2 days or if it is severe and watery. What side effects may I notice from receiving this medicine? Side effects that you should report to your doctor or health care professional  as soon as possible: -allergic reactions like skin rash, itching or hives, swelling of the face, lips, or tongue -breathing problems -fever, infection -high or low blood pressure -muscle pain -numb or tingling pain -trouble passing urine or change in the amount of urine -unusually tired or weak -vomiting Side effects that usually do not require medical attention (report to your doctor or health care professional if they continue or are bothersome): -constipation or diarrhea -trouble sleeping -headache -nausea -stomach upset This list may not describe all possible side effects. Call your doctor for medical advice about side effects. You may report side effects to FDA at 1-800-FDA-1088. Where should I keep my medicine? Keep out of the reach of children. If you are using this medicine at home, you will be instructed on how to store this medicine.  Throw away any unused medicine after the expiration date on the label. NOTE: This sheet is a summary. It may not cover all possible information. If you have questions about this medicine, talk to your doctor, pharmacist, or health care provider.    2016, Elsevier/Gold Standard. (2012-09-16 07:33:48)

## 2015-11-18 NOTE — Progress Notes (Signed)
Hematology and Oncology Follow Up Visit  [name] [id number] Jul 14, 1950 65 y.o. 11/18/2015   Principle Diagnosis:  1. IgG kappa myeloma. 2. Anemia secondary to renal insufficiency. 3. Intermittent iron-deficiency anemia. 4. Hypotestosteronemia  Current Therapy:   1. Aredia 60 mg IV q.2 months  2. Aranesp 300 mcg subcu as needed for hemoglobin less than 11. 3. Depo-Testosterone 300 mg q.2 weeks. 4. IV iron as indicated.     Interim History:  [name] is back for followup. He apparently had prostate hypertrophy that was treated surgically last week. This was at [facility]. He had no problems with the surgery. He had a Foley catheter in for a couple days. If he says he just is not feeling all that well. He hurts quite a bit. I think he has some fibromyalgia.  He has had some headache. He's had no rashes.  He is iron studies have been doing pretty well. His ferritin today was1460 with an iron saturation of 24%.  Back in January, his M spike was 0.7 g/dL. His IgG level was 1959 mg/dL  He does have significant diabetes. He is on insulin.   The last time he got Aranesp was back in September 2015.   His testosterone that we give him does seem to make a difference. He does seem to have more energy with testosterone. Back in May, his testosterone level was 191.We will have to recheck this today on his next appointment.   He does get the Aredia. This does help his bones a little bit.  He's had no bleeding. He's had no change in bowel or bladder habits.  Overall, his performance status is ECOG 1.   Medications:  Current Outpatient Prescriptions:  .  amLODipine (NORVASC) 10 MG tablet, Take 10 mg by mouth daily., Disp: , Rfl:  .  CARAFATE 1 GM/10ML suspension, Take 1 g by mouth 4 (four) times daily. , Disp: , Rfl:  .  diazepam (VALIUM) 5 MG tablet, Take 5 mg by mouth every 4 (four) hours as needed for anxiety., Disp: , Rfl:  .  dicyclomine (BENTYL) 10 MG capsule,  Take 1 capsule (10 mg total) by mouth 3 (three) times daily., Disp: 90 capsule, Rfl: 2 .  furosemide (LASIX) 40 MG tablet, Take 40 mg by mouth., Disp: , Rfl:  .  glipiZIDE (GLUCOTROL) 5 MG tablet, Take 5 mg by mouth daily before breakfast., Disp: , Rfl:  .  Hypromellose (NATURAL BALANCE TEARS OP), Apply 1 drop to eye every morning. , Disp: , Rfl:  .  Linaclotide (LINZESS) 145 MCG CAPS capsule, Take 1 capsule (145 mcg total) by mouth daily before breakfast., Disp: 30 capsule, Rfl: 2 .  meclizine (ANTIVERT) 50 MG tablet, Take 0.5 tablets (25 mg total) by mouth 4 (four) times daily as needed for dizziness., Disp: 30 tablet, Rfl: 0 .  meloxicam (MOBIC) 15 MG tablet, Take 15 mg by mouth., Disp: , Rfl:  .  mesalamine (LIALDA) 1.2 g EC tablet, Take 1,200 mg by mouth., Disp: , Rfl:  .  methocarbamol (ROBAXIN) 500 MG tablet, Take 500 mg by mouth 2 (two) times daily as needed for muscle spasms. , Disp: , Rfl:  .  metoprolol succinate (TOPROL XL) 50 MG 24 hr tablet, Take 50 mg by mouth., Disp: , Rfl:  .  NUCYNTA ER 100 MG TB12, Take 1 tablet by mouth 3 (three) times daily., Disp: , Rfl: 0 .  omeprazole (PRILOSEC) 20 MG capsule, Take 1 capsule (20 mg total)  by mouth daily., Disp: 30 capsule, Rfl: 11 .  ondansetron (ZOFRAN ODT) 4 MG disintegrating tablet, Take 1 tablet (4 mg total) by mouth every 8 (eight) hours as needed for nausea or vomiting., Disp: 10 tablet, Rfl: 0 .  OxyCODONE (OXYCONTIN) 80 mg T12A 12 hr tablet, Take 80 mg by mouth 3 (three) times daily., Disp: , Rfl:  .  oxyCODONE-acetaminophen (PERCOCET) 10-325 MG per tablet, Take 1 tablet by mouth every 6 (six) hours as needed for pain. , Disp: , Rfl:  .  Polyvinyl Alcohol-Povidone (CLEAR EYES ALL SEASONS) 5-6 MG/ML SOLN, Apply 2 drops to eye 3 (three) times daily as needed (drye eyes.). , Disp: , Rfl:  .  potassium chloride SA (K-DUR,KLOR-CON) 20 MEQ tablet, Take 1 tablet (20 mEq total) by mouth 2 (two) times daily., Disp: 60 tablet, Rfl: 6 .   prochlorperazine (COMPAZINE) 10 MG tablet, Take 1 tablet (10 mg total) by mouth every 6 (six) hours as needed for nausea or vomiting., Disp: 60 tablet, Rfl: 2 .  Sennosides (LAXATIVE PILLS PO), Take 1 tablet by mouth daily as needed (constipation)., Disp: , Rfl:  .  sildenafil (VIAGRA) 100 MG tablet, Take 100 mg by mouth., Disp: , Rfl:  .  Tamsulosin HCl (FLOMAX) 0.4 MG CAPS, Take 0.4 mg by mouth daily. , Disp: , Rfl:  .  triamterene-hydrochlorothiazide (MAXZIDE-25) 37.5-25 MG per tablet, Take 1 tablet by mouth daily.  , Disp: , Rfl:  .  Vitamin D, Ergocalciferol, (DRISDOL) 50000 UNITS CAPS capsule, Take 50,000 Units by mouth every 7 (seven) days., Disp: , Rfl:  No current facility-administered medications for this visit.   Facility-Administered Medications Ordered in Other Visits:  .  0.9 %  sodium chloride infusion, , Intravenous, Continuous, Volanda Napoleon, MD, Last Rate: 20 mL/hr at 11/18/15 0944 .  Influenza vac split quadrivalent PF (FLUARIX) injection 0.5 mL, 0.5 mL, Intramuscular, Once, Volanda Napoleon, MD .  morphine 4 MG/ML injection 4 mg, 4 mg, Intravenous, Q4H PRN, Volanda Napoleon, MD .  pamidronate (AREDIA) 60 mg in sodium chloride 0.9 % 500 mL IVPB, 60 mg, Intravenous, Once, Volanda Napoleon, MD, Last Rate: 127.5 mL/hr at 11/18/15 1119, 60 mg at 11/18/15 1119 .  testosterone cypionate (DEPOTESTOSTERONE CYPIONATE) injection 300 mg, 300 mg, Intramuscular, Q14 Days, Volanda Napoleon, MD  Allergies:  Allergies  Allergen Reactions  . Iodine Anxiety and Rash    Didn't feel right "instructed not to take per MD--something with his port"    Past Medical History, Surgical history, Social history, and Family History were reviewed and updated.  Review of Systems: As above  Physical Exam:  height is 5\' 11"  (1.803 m) and weight is 199 lb (90.3 kg). His oral temperature is 99.1 F (37.3 C). His blood pressure is 171/77 (abnormal) and his pulse is 78. His respiration is 16.   His head  and neck exam shows no ocular or oral lesions. He has no adenopathy in the neck. His thyroid is non-palpable. Lungs are clear. Cardiac exam regular rate and rhythm with no murmurs, rubs or bruits.. Abdomen is soft. His abdomen is slightly distended. He has good bowel sounds. There is no fluid wave. There is no palpable liver or spleen tip. Back exam shows no tenderness in the lumbar spine. There is no paravertebral muscle spasms. . He has slightly decreased range of motion of the lower back. Extremities shows no clubbing cyanosis or edema.He does have a swelling in the posterior right shoulder. There is  some tenderness in this area to palpation. He has fairly decent range of motion of the right arm. He has decent strength in his legs. Skin exam shows no rashes. Neurological exam is nonfocal.  Lab Results  Component Value Date   WBC 7.6 11/18/2015   HGB 12.9 (L) 11/18/2015   HCT 37.3 (L) 11/18/2015   MCV 75 (L) 11/18/2015   PLT 257 11/18/2015     Chemistry      Component Value Date/Time   NA 136 11/18/2015 0804   K 3.3 11/18/2015 0804   CL 99 11/18/2015 0804   CO2 30 11/18/2015 0804   BUN 17 11/18/2015 0804   CREATININE 2.1 (H) 11/18/2015 0804      Component Value Date/Time   CALCIUM 9.0 11/18/2015 0804   ALKPHOS 91 (H) 11/18/2015 0804   AST 36 11/18/2015 0804   ALT 35 11/18/2015 0804   BILITOT 0.70 11/18/2015 0804         Impression and Plan: [name] is a 65 year old gentleman. He has multiple medical problems. I still do not think that myeloma is an issue. With his monoclonal spike holding  steady, I still do not think there is any indication that his myeloma needs to be treated.  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   He has fibromyalgia. He has diabetes. He has low testosterone.   He will get his Aredia today.   I will plan to get back to  see me in 2 more months.  Volanda Napoleon, MD 9/25/20173:12 PM

## 2015-11-19 LAB — KAPPA/LAMBDA LIGHT CHAINS
Ig Kappa Free Light Chain: 40.6 mg/L — ABNORMAL HIGH (ref 3.3–19.4)
Ig Lambda Free Light Chain: 29.7 mg/L — ABNORMAL HIGH (ref 5.7–26.3)
KAPPA/LAMBDA FLC RATIO: 1.37 (ref 0.26–1.65)

## 2015-11-19 LAB — TESTOSTERONE: TESTOSTERONE: 933 ng/dL — AB (ref 264–916)

## 2015-11-19 LAB — IGG, IGA, IGM
IgA, Qn, Serum: 301 mg/dL (ref 61–437)
IgG, Qn, Serum: 1927 mg/dL — ABNORMAL HIGH (ref 700–1600)
IgM, Qn, Serum: 72 mg/dL (ref 20–172)

## 2015-11-21 LAB — PROTEIN ELECTROPHORESIS, SERUM, WITH REFLEX
A/G Ratio: 0.9 (ref 0.7–1.7)
ALPHA 1: 0.2 g/dL (ref 0.0–0.4)
Albumin: 3.7 g/dL (ref 2.9–4.4)
Alpha 2: 1 g/dL (ref 0.4–1.0)
Beta: 1 g/dL (ref 0.7–1.3)
Gamma Globulin: 1.8 g/dL (ref 0.4–1.8)
Globulin, Total: 4 g/dL — ABNORMAL HIGH (ref 2.2–3.9)
INTERPRETATION(SEE BELOW): 0
M-SPIKE, %: 1 g/dL — AB
PDF SPE: 0
Total Protein: 7.7 g/dL (ref 6.0–8.5)

## 2015-11-28 ENCOUNTER — Ambulatory Visit (INDEPENDENT_AMBULATORY_CARE_PROVIDER_SITE_OTHER): Payer: Medicare Other | Admitting: Podiatry

## 2015-11-28 DIAGNOSIS — Q828 Other specified congenital malformations of skin: Secondary | ICD-10-CM

## 2015-11-28 DIAGNOSIS — M2580 Other specified joint disorders, unspecified joint: Secondary | ICD-10-CM | POA: Diagnosis not present

## 2015-11-28 NOTE — Progress Notes (Signed)
He presents today for follow-up of the callus and ulceration to the foot. The ulceration has gone on to heal uneventfully along the left heel but he states it is still sore. He states that he saw Thurmond Butts here as he points to the sesamoids of the left foot. Is also complaining of painful calluses. No open lesions.  Objective: Pulses remain palpable. His pain on palpation and range of motion of the first metatarsophalangeal joint and the lateral sesamoid. He also has reactive hyperkeratosis left foot and no open lesions at this time.  Assessment: Sesamoiditis left foot. Hyperkeratosis left foot. History of ulceration left heel.  Plan: Debrided all areas of reactive hyperkeratosis and injected the sesamoids through the first intermetatarsal space today with Kenalog. Placed him in a silicone heel pad.

## 2015-12-02 ENCOUNTER — Ambulatory Visit (HOSPITAL_BASED_OUTPATIENT_CLINIC_OR_DEPARTMENT_OTHER): Payer: Medicare Other

## 2015-12-02 VITALS — BP 160/82 | HR 88 | Temp 97.0°F

## 2015-12-02 DIAGNOSIS — E291 Testicular hypofunction: Secondary | ICD-10-CM | POA: Diagnosis present

## 2015-12-02 DIAGNOSIS — E349 Endocrine disorder, unspecified: Secondary | ICD-10-CM

## 2015-12-02 MED ORDER — TESTOSTERONE CYPIONATE 200 MG/ML IM SOLN
INTRAMUSCULAR | Status: AC
  Filled 2015-12-02: qty 2

## 2015-12-02 MED ORDER — TESTOSTERONE CYPIONATE 200 MG/ML IM SOLN
300.0000 mg | INTRAMUSCULAR | Status: DC
  Administered 2015-12-02: 300 mg via INTRAMUSCULAR

## 2015-12-02 NOTE — Patient Instructions (Signed)

## 2015-12-03 ENCOUNTER — Encounter: Payer: Self-pay | Admitting: Nurse Practitioner

## 2015-12-09 ENCOUNTER — Other Ambulatory Visit (HOSPITAL_BASED_OUTPATIENT_CLINIC_OR_DEPARTMENT_OTHER): Payer: Medicare Other

## 2015-12-09 ENCOUNTER — Ambulatory Visit (HOSPITAL_BASED_OUTPATIENT_CLINIC_OR_DEPARTMENT_OTHER): Payer: Medicare Other

## 2015-12-09 ENCOUNTER — Other Ambulatory Visit: Payer: Self-pay

## 2015-12-09 ENCOUNTER — Other Ambulatory Visit: Payer: Self-pay | Admitting: Nurse Practitioner

## 2015-12-09 VITALS — BP 126/67 | HR 80 | Temp 98.2°F | Resp 18

## 2015-12-09 DIAGNOSIS — C9000 Multiple myeloma not having achieved remission: Secondary | ICD-10-CM

## 2015-12-09 DIAGNOSIS — E349 Endocrine disorder, unspecified: Secondary | ICD-10-CM

## 2015-12-09 LAB — CBC WITH DIFFERENTIAL (CANCER CENTER ONLY)
BASO#: 0.1 10*3/uL (ref 0.0–0.2)
BASO%: 0.9 % (ref 0.0–2.0)
EOS ABS: 0.2 10*3/uL (ref 0.0–0.5)
EOS%: 2.9 % (ref 0.0–7.0)
HCT: 36.1 % — ABNORMAL LOW (ref 38.7–49.9)
HGB: 12.1 g/dL — ABNORMAL LOW (ref 13.0–17.1)
LYMPH#: 1.4 10*3/uL (ref 0.9–3.3)
LYMPH%: 18 % (ref 14.0–48.0)
MCH: 25.2 pg — AB (ref 28.0–33.4)
MCHC: 33.5 g/dL (ref 32.0–35.9)
MCV: 75 fL — AB (ref 82–98)
MONO#: 0.7 10*3/uL (ref 0.1–0.9)
MONO%: 9.7 % (ref 0.0–13.0)
NEUT#: 5.2 10*3/uL (ref 1.5–6.5)
NEUT%: 68.5 % (ref 40.0–80.0)
PLATELETS: 238 10*3/uL (ref 145–400)
RBC: 4.8 10*6/uL (ref 4.20–5.70)
RDW: 15.6 % (ref 11.1–15.7)
WBC: 7.6 10*3/uL (ref 4.0–10.0)

## 2015-12-09 LAB — CMP (CANCER CENTER ONLY)
ALT(SGPT): 24 U/L (ref 10–47)
AST: 22 U/L (ref 11–38)
Albumin: 3.5 g/dL (ref 3.3–5.5)
Alkaline Phosphatase: 89 U/L — ABNORMAL HIGH (ref 26–84)
BUN: 19 mg/dL (ref 7–22)
CHLORIDE: 100 meq/L (ref 98–108)
CO2: 28 mEq/L (ref 18–33)
CREATININE: 2.3 mg/dL — AB (ref 0.6–1.2)
Calcium: 8.5 mg/dL (ref 8.0–10.3)
GLUCOSE: 205 mg/dL — AB (ref 73–118)
POTASSIUM: 4 meq/L (ref 3.3–4.7)
SODIUM: 132 meq/L (ref 128–145)
TOTAL PROTEIN: 7.4 g/dL (ref 6.4–8.1)
Total Bilirubin: 0.5 mg/dl (ref 0.20–1.60)

## 2015-12-09 MED ORDER — SODIUM CHLORIDE 0.9 % IV SOLN: 60.0000 mg | Freq: Once | INTRAVENOUS | Status: DC

## 2015-12-09 MED ORDER — HYDROMORPHONE HCL 1 MG/ML IJ SOLN
INTRAMUSCULAR | Status: AC
  Filled 2015-12-09: qty 2

## 2015-12-09 MED ORDER — PAMIDRONATE DISODIUM 90 MG/10ML IV SOLN
60.0000 mg | Freq: Once | INTRAVENOUS | Status: AC
  Administered 2015-12-09: 60 mg via INTRAVENOUS
  Filled 2015-12-09: qty 6.67

## 2015-12-09 MED ORDER — SODIUM CHLORIDE 0.9 % IV SOLN
Freq: Once | INTRAVENOUS | Status: AC
  Administered 2015-12-09: 09:00:00 via INTRAVENOUS

## 2015-12-09 MED ORDER — HYDROMORPHONE HCL 1 MG/ML IJ SOLN
2.0000 mg | Freq: Once | INTRAMUSCULAR | Status: AC
  Administered 2015-12-09: 2 mg via INTRAVENOUS

## 2015-12-09 NOTE — Patient Instructions (Signed)
Pamidronate injection What is this medicine? PAMIDRONATE (pa mi DROE nate) slows calcium loss from bones. It is used to treat high calcium blood levels from cancer or Paget's disease. It is also used to treat bone pain and prevent fractures from certain cancers that have spread to the bone. This medicine may be used for other purposes; ask your health care provider or pharmacist if you have questions. What should I tell my health care provider before I take this medicine? They need to know if you have any of these conditions: -aspirin-sensitive asthma -dental disease -kidney disease -an unusual or allergic reaction to pamidronate, other medicines, foods, dyes, or preservatives -pregnant or trying to get pregnant -breast-feeding How should I use this medicine? This medicine is for infusion into a vein. It is given by a health care professional in a hospital or clinic setting. Talk to your pediatrician regarding the use of this medicine in children. This medicine is not approved for use in children. Overdosage: If you think you have taken too much of this medicine contact a poison control center or emergency room at once. NOTE: This medicine is only for you. Do not share this medicine with others. What if I miss a dose? This does not apply. What may interact with this medicine? -certain antibiotics given by injection -medicines for inflammation or pain like ibuprofen, naproxen -some diuretics like bumetanide, furosemide -cyclosporine -parathyroid hormone -tacrolimus -teriparatide -thalidomide This list may not describe all possible interactions. Give your health care provider a list of all the medicines, herbs, non-prescription drugs, or dietary supplements you use. Also tell them if you smoke, drink alcohol, or use illegal drugs. Some items may interact with your medicine. What should I watch for while using this medicine? Visit your doctor or health care professional for regular  checkups. It may be some time before you see the benefit from this medicine. Do not stop taking your medicine unless your doctor tells you to. Your doctor may order blood tests or other tests to see how you are doing. Women should inform their doctor if they wish to become pregnant or think they might be pregnant. There is a potential for serious side effects to an unborn child. Talk to your health care professional or pharmacist for more information. You should make sure that you get enough calcium and vitamin D while you are taking this medicine. Discuss the foods you eat and the vitamins you take with your health care professional. Some people who take this medicine have severe bone, joint, and/or muscle pain. This medicine may also increase your risk for a broken thigh bone. Tell your doctor right away if you have pain in your upper leg or groin. Tell your doctor if you have any pain that does not go away or that gets worse. What side effects may I notice from receiving this medicine? Side effects that you should report to your doctor or health care professional as soon as possible: -allergic reactions like skin rash, itching or hives, swelling of the face, lips, or tongue -black or tarry stools -changes in vision -eye inflammation, pain -high blood pressure -jaw pain, especially burning or cramping -muscle weakness -numb, tingling pain -swelling of feet or hands -trouble passing urine or change in the amount of urine -unable to move easily Side effects that usually do not require medical attention (report to your doctor or health care professional if they continue or are bothersome): -bone, joint, or muscle pain -constipation -dizzy, drowsy -fever -headache -loss of  appetite -nausea, vomiting -pain at site where injected This list may not describe all possible side effects. Call your doctor for medical advice about side effects. You may report side effects to FDA at  1-800-FDA-1088. Where should I keep my medicine? This drug is given in a hospital or clinic and will not be stored at home. NOTE: This sheet is a summary. It may not cover all possible information. If you have questions about this medicine, talk to your doctor, pharmacist, or health care provider.    2016, Elsevier/Gold Standard. (2010-08-08 08:49:49)

## 2015-12-16 ENCOUNTER — Ambulatory Visit (HOSPITAL_BASED_OUTPATIENT_CLINIC_OR_DEPARTMENT_OTHER): Payer: Medicare Other

## 2015-12-16 VITALS — BP 165/76 | HR 81 | Temp 99.2°F

## 2015-12-16 DIAGNOSIS — E349 Endocrine disorder, unspecified: Secondary | ICD-10-CM

## 2015-12-16 DIAGNOSIS — E291 Testicular hypofunction: Secondary | ICD-10-CM | POA: Diagnosis present

## 2015-12-16 MED ORDER — TESTOSTERONE CYPIONATE 200 MG/ML IM SOLN
INTRAMUSCULAR | Status: AC
  Filled 2015-12-16: qty 2

## 2015-12-16 MED ORDER — TESTOSTERONE CYPIONATE 200 MG/ML IM SOLN
300.0000 mg | INTRAMUSCULAR | Status: DC
  Administered 2015-12-16: 300 mg via INTRAMUSCULAR

## 2015-12-16 NOTE — Patient Instructions (Signed)

## 2015-12-17 ENCOUNTER — Other Ambulatory Visit: Payer: Self-pay | Admitting: Family

## 2015-12-17 DIAGNOSIS — N189 Chronic kidney disease, unspecified: Principal | ICD-10-CM

## 2015-12-17 DIAGNOSIS — Z299 Encounter for prophylactic measures, unspecified: Secondary | ICD-10-CM | POA: Insufficient documentation

## 2015-12-17 DIAGNOSIS — D631 Anemia in chronic kidney disease: Secondary | ICD-10-CM

## 2015-12-30 ENCOUNTER — Ambulatory Visit (HOSPITAL_BASED_OUTPATIENT_CLINIC_OR_DEPARTMENT_OTHER): Payer: Medicare Other

## 2015-12-30 ENCOUNTER — Ambulatory Visit: Payer: Self-pay | Admitting: Hematology & Oncology

## 2015-12-30 ENCOUNTER — Other Ambulatory Visit: Payer: Self-pay

## 2015-12-30 VITALS — BP 175/83 | HR 84 | Temp 97.9°F | Resp 18

## 2015-12-30 DIAGNOSIS — E291 Testicular hypofunction: Secondary | ICD-10-CM

## 2015-12-30 DIAGNOSIS — E349 Endocrine disorder, unspecified: Secondary | ICD-10-CM

## 2015-12-30 MED ORDER — TESTOSTERONE CYPIONATE 200 MG/ML IM SOLN
300.0000 mg | INTRAMUSCULAR | Status: DC
Start: 2015-12-30 — End: 2015-12-30
  Administered 2015-12-30: 300 mg via INTRAMUSCULAR

## 2015-12-30 MED ORDER — TESTOSTERONE CYPIONATE 200 MG/ML IM SOLN
INTRAMUSCULAR | Status: AC
  Filled 2015-12-30: qty 2

## 2015-12-30 NOTE — Patient Instructions (Signed)

## 2015-12-31 ENCOUNTER — Encounter: Payer: Self-pay | Admitting: Podiatry

## 2015-12-31 ENCOUNTER — Ambulatory Visit (INDEPENDENT_AMBULATORY_CARE_PROVIDER_SITE_OTHER): Payer: Medicare Other | Admitting: Podiatry

## 2015-12-31 DIAGNOSIS — M2580 Other specified joint disorders, unspecified joint: Secondary | ICD-10-CM

## 2015-12-31 DIAGNOSIS — Q828 Other specified congenital malformations of skin: Secondary | ICD-10-CM | POA: Diagnosis not present

## 2015-12-31 NOTE — Progress Notes (Signed)
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 He states that sesamoiditis has resolved however he still has pain to the fifth metatarsophalangeal joint area of the left foot. He has healed the ulceration on the posterior aspect of the left heel.  Objective: Vital signs are stable alert oriented 3. Ulceration has healed 100% no signs of infection pulses remain palpable. Porokeratotic lesion or reactive hyperkeratosis is present over the same site. I debrided today without bleeding. He has no pain on range of motion of the first metatarsophalangeal joint or on palpation of the sesamoids. He does however have pain overlying the fifth metatarsophalangeal joint area of the left foot.  Assessment: Capsulitis fifth metatarsophalangeal joint left foot. Porokeratosis and resolving ulceration. Resolving sesamoiditis.  Plan: Debrided reactive hyperkeratotic lesion for him today. I also injected Kenalog and local anesthetic around the fifth metatarsophalangeal joint. I will follow-up with him in 6 weeks

## 2016-01-01 ENCOUNTER — Ambulatory Visit (INDEPENDENT_AMBULATORY_CARE_PROVIDER_SITE_OTHER): Payer: Medicare Other | Admitting: Gastroenterology

## 2016-01-01 ENCOUNTER — Encounter: Payer: Self-pay | Admitting: Gastroenterology

## 2016-01-01 VITALS — BP 122/58 | HR 90 | Ht 71.0 in | Wt 197.0 lb

## 2016-01-01 DIAGNOSIS — R1030 Lower abdominal pain, unspecified: Secondary | ICD-10-CM | POA: Diagnosis not present

## 2016-01-01 DIAGNOSIS — K5900 Constipation, unspecified: Secondary | ICD-10-CM

## 2016-01-01 DIAGNOSIS — K219 Gastro-esophageal reflux disease without esophagitis: Secondary | ICD-10-CM

## 2016-01-01 MED ORDER — OMEPRAZOLE 40 MG PO CPDR: 40.0000 mg | DELAYED_RELEASE_CAPSULE | Freq: Two times a day (BID) | ORAL | 5 refills | Status: DC

## 2016-01-01 NOTE — Progress Notes (Signed)
    History of Present Illness: This is a 65 year old with MM, CKD, fibromyalgia, constipation, lower abdominal pain and GERD. He has had chronic problems with right lower quadrant pain and now relates frequent problems with bilateral lower abdominal pain the symptoms are exacerbated by movement and bending. He has had constipation which is under better control taking Linzess as needed. His lower abdominal pain does not seem to be correlated with his constipation. He is concerned he has a hernia or other diagnosis that has been overlooked causing abdominal pain. He relates frequent belching and heartburn despite taking his current PPI daily as directed.  Current Medications, Allergies, Past Medical History, Past Surgical History, Family History and Social History were reviewed in [employer] record.  Physical Exam: General: Well developed, well nourished, no acute distress Head: Normocephalic and atraumatic Eyes:  sclerae anicteric, EOMI Ears: Normal auditory acuity Mouth: No deformity or lesions Lungs: Clear throughout to auscultation Heart: Regular rate and rhythm; no murmurs, rubs or bruits Abdomen: Soft, lower abdominal tenderness and mildly distended. Diastasis. No masses, hepatosplenomegaly or hernias noted. Normal Bowel sounds Musculoskeletal: Symmetrical with no gross deformities  Pulses:  Normal pulses noted Extremities: No clubbing, cyanosis, edema or deformities noted Neurological: Alert oriented x 4, grossly nonfocal Psychological:  Alert and cooperative. Normal mood and affect  Assessment and Recommendations:  1. Lower abdominal pain. Previously had chronic RLQ and now is across lower abdomen. Likely musculoskeletal. Possibly related to fibromyalgia. Rule out intra-abdominal process. Schedule abdominal/pelvic CT scan with oral contrast. He has CKD and an IV contrast allergy.  2. Constipation. Advised to use Linzess on a daily basis (not prn) and if his  stools are too frequent or too loose we can consider a reduced dose or an alternative medication.  3. GERD. Increase omeprazole to 40 mg twice daily and follow all standard antireflux measures.  3. GERD. Increase omeprazole to 40 mg po bid .

## 2016-01-01 NOTE — Patient Instructions (Signed)
Resume your Linzess daily.   Increase your omeprazole to 40 mg twice daily. A new prescription has been sent to your pharmacy.   You have been scheduled for a CT scan of the abdomen and pelvis at [facility] ([address]---this is in the same building as [employer]).   You are scheduled on 01/06/16 at 3:30pm. You should arrive 15 minutes prior to your appointment time for registration. Please follow the written instructions below on the day of your exam:  WARNING: IF YOU ARE ALLERGIC TO IODINE/X-RAY DYE, PLEASE NOTIFY RADIOLOGY IMMEDIATELY AT [phone]! YOU WILL BE GIVEN A 13 HOUR PREMEDICATION PREP.  1) Do not eat or drink anything after 11:30am (4 hours prior to your test) 2) You have been given 2 bottles of oral contrast to drink. The solution may taste               better if refrigerated, but do NOT add ice or any other liquid to this solution. Shake             well before drinking.    Drink 1 bottle of contrast @ 1:30pm (2 hours prior to your exam)  Drink 1 bottle of contrast @ 2;30pm (1 hour prior to your exam)  You may take any medications as prescribed with a small amount of water except for the following: Metformin, Glucophage, Glucovance, Avandamet, Riomet, Fortamet, Actoplus Met, Janumet, Glumetza or Metaglip. The above medications must be held the day of the exam AND 48 hours after the exam.  The purpose of you drinking the oral contrast is to aid in the visualization of your intestinal tract. The contrast solution may cause some diarrhea. Before your exam is started, you will be given a small amount of fluid to drink. Depending on your individual set of symptoms, you may also receive an intravenous injection of x-ray contrast/dye. Plan on being at [facility] for 30 minutes or longer, depending on the type of exam you are having performed.  This test typically takes 30-45 minutes to complete.  If you have any questions regarding your exam or if  you need to reschedule, you may call the CT department at [phone] between the hours of 8:00 am and 5:00 pm, Monday-Friday.  ________________________________________________________________________  Thank you for choosing me and [facility].  Pricilla Riffle. Dagoberto Ligas., MD., [facility]

## 2016-01-06 ENCOUNTER — Ambulatory Visit (INDEPENDENT_AMBULATORY_CARE_PROVIDER_SITE_OTHER)
Admission: RE | Admit: 2016-01-06 | Discharge: 2016-01-06 | Disposition: A | Discharge status: Home / Self Care | Payer: Medicare Other | Source: Ambulatory Visit | Attending: Gastroenterology | Admitting: Gastroenterology

## 2016-01-06 DIAGNOSIS — R1030 Lower abdominal pain, unspecified: Secondary | ICD-10-CM | POA: Diagnosis not present

## 2016-01-10 ENCOUNTER — Other Ambulatory Visit: Payer: Self-pay

## 2016-01-10 DIAGNOSIS — C9000 Multiple myeloma not having achieved remission: Secondary | ICD-10-CM

## 2016-01-13 ENCOUNTER — Ambulatory Visit (HOSPITAL_BASED_OUTPATIENT_CLINIC_OR_DEPARTMENT_OTHER): Payer: Medicare Other

## 2016-01-13 ENCOUNTER — Ambulatory Visit (HOSPITAL_BASED_OUTPATIENT_CLINIC_OR_DEPARTMENT_OTHER): Payer: Medicare Other | Admitting: Hematology & Oncology

## 2016-01-13 ENCOUNTER — Other Ambulatory Visit (HOSPITAL_BASED_OUTPATIENT_CLINIC_OR_DEPARTMENT_OTHER): Payer: Medicare Other

## 2016-01-13 VITALS — BP 160/70 | HR 81 | Temp 97.9°F | Resp 20 | Wt 197.2 lb

## 2016-01-13 DIAGNOSIS — C9000 Multiple myeloma not having achieved remission: Secondary | ICD-10-CM

## 2016-01-13 DIAGNOSIS — D509 Iron deficiency anemia, unspecified: Secondary | ICD-10-CM

## 2016-01-13 DIAGNOSIS — E349 Endocrine disorder, unspecified: Secondary | ICD-10-CM

## 2016-01-13 DIAGNOSIS — Z299 Encounter for prophylactic measures, unspecified: Secondary | ICD-10-CM

## 2016-01-13 DIAGNOSIS — E291 Testicular hypofunction: Secondary | ICD-10-CM

## 2016-01-13 LAB — CBC WITH DIFFERENTIAL (CANCER CENTER ONLY)
BASO#: 0.1 10*3/uL (ref 0.0–0.2)
BASO%: 0.9 % (ref 0.0–2.0)
EOS%: 1.4 % (ref 0.0–7.0)
Eosinophils Absolute: 0.1 10*3/uL (ref 0.0–0.5)
HEMATOCRIT: 40.1 % (ref 38.7–49.9)
HGB: 13.7 g/dL (ref 13.0–17.1)
LYMPH#: 1.5 10*3/uL (ref 0.9–3.3)
LYMPH%: 16.7 % (ref 14.0–48.0)
MCH: 25 pg — ABNORMAL LOW (ref 28.0–33.4)
MCHC: 34.2 g/dL (ref 32.0–35.9)
MCV: 73 fL — AB (ref 82–98)
MONO#: 1 10*3/uL — AB (ref 0.1–0.9)
MONO%: 10.9 % (ref 0.0–13.0)
NEUT#: 6.1 10*3/uL (ref 1.5–6.5)
NEUT%: 70.1 % (ref 40.0–80.0)
PLATELETS: 225 10*3/uL (ref 145–400)
RBC: 5.47 10*6/uL (ref 4.20–5.70)
RDW: 15.9 % — ABNORMAL HIGH (ref 11.1–15.7)
WBC: 8.7 10*3/uL (ref 4.0–10.0)

## 2016-01-13 LAB — CMP (CANCER CENTER ONLY)
ALK PHOS: 77 U/L (ref 26–84)
ALT: 19 U/L (ref 10–47)
AST: 24 U/L (ref 11–38)
Albumin: 3.6 g/dL (ref 3.3–5.5)
BILIRUBIN TOTAL: 0.6 mg/dL (ref 0.20–1.60)
BUN: 19 mg/dL (ref 7–22)
CALCIUM: 9.1 mg/dL (ref 8.0–10.3)
CO2: 26 mEq/L (ref 18–33)
Chloride: 103 mEq/L (ref 98–108)
Creat: 2.4 mg/dl — ABNORMAL HIGH (ref 0.6–1.2)
Glucose, Bld: 125 mg/dL — ABNORMAL HIGH (ref 73–118)
POTASSIUM: 3.2 meq/L — AB (ref 3.3–4.7)
Sodium: 141 mEq/L (ref 128–145)
TOTAL PROTEIN: 8.3 g/dL — AB (ref 6.4–8.1)

## 2016-01-13 LAB — IRON AND TIBC
%SAT: 28 % (ref 20–55)
IRON: 63 ug/dL (ref 42–163)
TIBC: 229 ug/dL (ref 202–409)
UIBC: 165 ug/dL (ref 117–376)

## 2016-01-13 LAB — FERRITIN: Ferritin: 934 ng/ml — ABNORMAL HIGH (ref 22–316)

## 2016-01-13 MED ORDER — HEPARIN SOD (PORK) LOCK FLUSH 100 UNIT/ML IV SOLN
500.0000 [IU] | Freq: Once | INTRAVENOUS | Status: AC
  Administered 2016-01-13: 500 [IU] via INTRAVENOUS
  Filled 2016-01-13: qty 5

## 2016-01-13 MED ORDER — TESTOSTERONE CYPIONATE 200 MG/ML IM SOLN
INTRAMUSCULAR | Status: AC
  Filled 2016-01-13: qty 2

## 2016-01-13 MED ORDER — SODIUM CHLORIDE 0.9 % IV SOLN: 60.0000 mg | Freq: Once | INTRAVENOUS | Status: DC

## 2016-01-13 MED ORDER — SODIUM CHLORIDE 0.9% FLUSH
10.0000 mL | INTRAVENOUS | Status: DC | PRN
  Administered 2016-01-13: 10 mL via INTRAVENOUS
  Filled 2016-01-13: qty 10

## 2016-01-13 MED ORDER — TESTOSTERONE CYPIONATE 200 MG/ML IM SOLN
300.0000 mg | INTRAMUSCULAR | Status: DC
  Administered 2016-01-13: 300 mg via INTRAMUSCULAR

## 2016-01-13 MED ORDER — SODIUM CHLORIDE 0.9 % IV SOLN
60.0000 mg | Freq: Once | INTRAVENOUS | Status: AC
  Administered 2016-01-13: 60 mg via INTRAVENOUS
  Filled 2016-01-13: qty 60

## 2016-01-13 NOTE — Patient Instructions (Signed)
Testosterone injection What is this medicine? TESTOSTERONE (tes TOS ter one) is the main male hormone. It supports normal male development such as muscle growth, facial hair, and deep voice. It is used in males to treat low testosterone levels. This medicine may be used for other purposes; ask your health care provider or pharmacist if you have questions. COMMON BRAND NAME(S): Andro-L.A., Aveed, Delatestryl, Depo-Testosterone, Virilon What should I tell my health care provider before I take this medicine? They need to know if you have any of these conditions: -cancer -diabetes -heart disease -kidney disease -liver disease -lung disease -prostate disease -an unusual or allergic reaction to testosterone, other medicines, foods, dyes, or preservatives -pregnant or trying to get pregnant -breast-feeding How should I use this medicine? This medicine is for injection into a muscle. It is usually given by a health care professional in a hospital or clinic setting. Contact your pediatrician regarding the use of this medicine in children. While this medicine may be prescribed for children as young as 12 years of age for selected conditions, precautions do apply. Overdosage: If you think you have taken too much of this medicine contact a poison control center or emergency room at once. NOTE: This medicine is only for you. Do not share this medicine with others. What if I miss a dose? Try not to miss a dose. Your doctor or health care professional will tell you when your next injection is due. Notify the office if you are unable to keep an appointment. What may interact with this medicine? -medicines for diabetes -medicines that treat or prevent blood clots like warfarin -oxyphenbutazone -propranolol -steroid medicines like prednisone or cortisone This list may not describe all possible interactions. Give your health care provider a list of all the medicines, herbs, non-prescription drugs, or  dietary supplements you use. Also tell them if you smoke, drink alcohol, or use illegal drugs. Some items may interact with your medicine. What should I watch for while using this medicine? Visit your doctor or health care professional for regular checks on your progress. They will need to check the level of testosterone in your blood. This medicine is only approved for use in men who have low levels of testosterone related to certain medical conditions. Heart attacks and strokes have been reported with the use of this medicine. Notify your doctor or health care professional and seek emergency treatment if you develop breathing problems; changes in vision; confusion; chest pain or chest tightness; sudden arm pain; severe, sudden headache; trouble speaking or understanding; sudden numbness or weakness of the face, arm or leg; loss of balance or coordination. Talk to your doctor about the risks and benefits of this medicine. This medicine may affect blood sugar levels. If you have diabetes, check with your doctor or health care professional before you change your diet or the dose of your diabetic medicine. Testosterone injections are not commonly used in women. Women should inform their doctor if they wish to become pregnant or think they might be pregnant. There is a potential for serious side effects to an unborn child. Talk to your health care professional or pharmacist for more information. Talk with your doctor or health care professional about your birth control options while taking this medicine. This drug is banned from use in athletes by most athletic organizations. What side effects may I notice from receiving this medicine? Side effects that you should report to your doctor or health care professional as soon as possible: -allergic reactions like skin rash,   itching or hives, swelling of the face, lips, or tongue -breast enlargement -breathing problems -changes in emotions or moods -deep or  hoarse voice -irregular menstrual periods -signs and symptoms of liver injury like dark yellow or brown urine; general ill feeling or flu-like symptoms; light-colored stools; loss of appetite; nausea; right upper belly pain; unusually weak or tired; yellowing of the eyes or skin -stomach pain -swelling of the ankles, feet, hands -too frequent or persistent erections -trouble passing urine or change in the amount of urine Side effects that usually do not require medical attention (report to your doctor or health care professional if they continue or are bothersome): -acne -change in sex drive or performance -facial hair growth -hair loss -headache This list may not describe all possible side effects. Call your doctor for medical advice about side effects. You may report side effects to FDA at 1-800-FDA-1088. Where should I keep my medicine? Keep out of the reach of children. This medicine can be abused. Keep your medicine in a safe place to protect it from theft. Do not share this medicine with anyone. Selling or giving away this medicine is dangerous and against the law. Store at room temperature between 20 and 25 degrees C (68 and 77 degrees F). Do not freeze. Protect from light. Follow the directions for the product you are prescribed. Throw away any unused medicine after the expiration date. NOTE: This sheet is a summary. It may not cover all possible information. If you have questions about this medicine, talk to your doctor, pharmacist, or health care provider.  2017 Elsevier/Gold Standard (2015-03-16 07:33:55) Pamidronate injection What is this medicine? PAMIDRONATE (pa mi DROE nate) slows calcium loss from bones. It is used to treat high calcium blood levels from cancer or Paget's disease. It is also used to treat bone pain and prevent fractures from certain cancers that have spread to the bone. This medicine may be used for other purposes; ask your health care provider or pharmacist if  you have questions. COMMON BRAND NAME(S): Aredia What should I tell my health care provider before I take this medicine? They need to know if you have any of these conditions: -aspirin-sensitive asthma -dental disease -kidney disease -an unusual or allergic reaction to pamidronate, other medicines, foods, dyes, or preservatives -pregnant or trying to get pregnant -breast-feeding How should I use this medicine? This medicine is for infusion into a vein. It is given by a health care professional in a hospital or clinic setting. Talk to your pediatrician regarding the use of this medicine in children. This medicine is not approved for use in children. Overdosage: If you think you have taken too much of this medicine contact a poison control center or emergency room at once. NOTE: This medicine is only for you. Do not share this medicine with others. What if I miss a dose? This does not apply. What may interact with this medicine? -certain antibiotics given by injection -medicines for inflammation or pain like ibuprofen, naproxen -some diuretics like bumetanide, furosemide -cyclosporine -parathyroid hormone -tacrolimus -teriparatide -thalidomide This list may not describe all possible interactions. Give your health care provider a list of all the medicines, herbs, non-prescription drugs, or dietary supplements you use. Also tell them if you smoke, drink alcohol, or use illegal drugs. Some items may interact with your medicine. What should I watch for while using this medicine? Visit your doctor or health care professional for regular checkups. It may be some time before you see the benefit from this  medicine. Do not stop taking your medicine unless your doctor tells you to. Your doctor may order blood tests or other tests to see how you are doing. Women should inform their doctor if they wish to become pregnant or think they might be pregnant. There is a potential for serious side effects to  an unborn child. Talk to your health care professional or pharmacist for more information. You should make sure that you get enough calcium and vitamin D while you are taking this medicine. Discuss the foods you eat and the vitamins you take with your health care professional. Some people who take this medicine have severe bone, joint, and/or muscle pain. This medicine may also increase your risk for a broken thigh bone. Tell your doctor right away if you have pain in your upper leg or groin. Tell your doctor if you have any pain that does not go away or that gets worse. What side effects may I notice from receiving this medicine? Side effects that you should report to your doctor or health care professional as soon as possible: -allergic reactions like skin rash, itching or hives, swelling of the face, lips, or tongue -black or tarry stools -changes in vision -eye inflammation, pain -high blood pressure -jaw pain, especially burning or cramping -muscle weakness -numb, tingling pain -swelling of feet or hands -trouble passing urine or change in the amount of urine -unable to move easily Side effects that usually do not require medical attention (report to your doctor or health care professional if they continue or are bothersome): -bone, joint, or muscle pain -constipation -dizzy, drowsy -fever -headache -loss of appetite -nausea, vomiting -pain at site where injected This list may not describe all possible side effects. Call your doctor for medical advice about side effects. You may report side effects to FDA at 1-800-FDA-1088. Where should I keep my medicine? This drug is given in a hospital or clinic and will not be stored at home. NOTE: This sheet is a summary. It may not cover all possible information. If you have questions about this medicine, talk to your doctor, pharmacist, or health care provider.  2017 Elsevier/Gold Standard (2010-08-08 08:49:49)

## 2016-01-13 NOTE — Progress Notes (Signed)
Hematology and Oncology Follow Up Visit  [name] [id number] 1950-03-28 65 y.o. 01/13/2016   Principle Diagnosis:  1. IgG kappa myeloma. 2. Anemia secondary to renal insufficiency. 3. Intermittent iron-deficiency anemia. 4. Hypotestosteronemia  Current Therapy:   1. Aredia 60 mg IV q.2 months  2. Aranesp 300 mcg subcu as needed for hemoglobin less than 11. 3. Depo-Testosterone 300 mg q.2 weeks. 4. IV iron as indicated.     Interim History:  [name] is back for followup. He apparently had prostate hypertrophy that was treated surgically last week. This was at [facility]. He had no problems with the surgery. He had a Foley catheter in for a couple days. If he says he just is not feeling all that well. He hurts quite a bit. I think he has some fibromyalgia.  He has had some headache. He's had no rashes.  He is iron studies have been doing pretty well. His ferritin back in September was1460 with an iron saturation of 24%.  Back in September, his M spike was 1.0 g/dL. His IgG level was 1927 mg/dL.  his Kappa Lightchain was 4.1 mg/dL.  He does have significant diabetes. He is on insulin.   The last time he got Aranesp was back in September 2015.   His testosterone that we give him does seem to make a difference. He does seem to have more energy with testosterone. Back in May, his testosterone level was 191.We will have to recheck this today on his next appointment.   He does get the Aredia. This does help his bones a little bit.  He's had no bleeding. He's had no change in bowel or bladder habits.  Overall, his performance status is ECOG 1.   Medications:  Current Outpatient Prescriptions:  .  amLODipine (NORVASC) 10 MG tablet, Take 10 mg by mouth daily., Disp: , Rfl:  .  CARAFATE 1 GM/10ML suspension, Take 1 g by mouth 4 (four) times daily. , Disp: , Rfl:  .  diazepam (VALIUM) 5 MG tablet, Take 5 mg by mouth every 4 (four) hours as needed for anxiety.,  Disp: , Rfl:  .  dicyclomine (BENTYL) 10 MG capsule, Take 1 capsule (10 mg total) by mouth 3 (three) times daily., Disp: 90 capsule, Rfl: 2 .  furosemide (LASIX) 40 MG tablet, Take 40 mg by mouth., Disp: , Rfl:  .  glipiZIDE (GLUCOTROL) 5 MG tablet, Take 5 mg by mouth daily before breakfast., Disp: , Rfl:  .  Hypromellose (NATURAL BALANCE TEARS OP), Apply 1 drop to eye every morning. , Disp: , Rfl:  .  Linaclotide (LINZESS) 145 MCG CAPS capsule, Take 1 capsule (145 mcg total) by mouth daily before breakfast., Disp: 30 capsule, Rfl: 2 .  meclizine (ANTIVERT) 50 MG tablet, Take 0.5 tablets (25 mg total) by mouth 4 (four) times daily as needed for dizziness., Disp: 30 tablet, Rfl: 0 .  meloxicam (MOBIC) 15 MG tablet, Take 15 mg by mouth., Disp: , Rfl:  .  mesalamine (LIALDA) 1.2 g EC tablet, Take 1,200 mg by mouth., Disp: , Rfl:  .  methocarbamol (ROBAXIN) 500 MG tablet, Take 500 mg by mouth 2 (two) times daily as needed for muscle spasms. , Disp: , Rfl:  .  metoprolol succinate (TOPROL XL) 50 MG 24 hr tablet, Take 50 mg by mouth., Disp: , Rfl:  .  NUCYNTA ER 100 MG TB12, Take 1 tablet by mouth 3 (three) times daily., Disp: , Rfl: 0 .  omeprazole (PRILOSEC)  40 MG capsule, Take 1 capsule (40 mg total) by mouth 2 (two) times daily., Disp: 60 capsule, Rfl: 5 .  ondansetron (ZOFRAN ODT) 4 MG disintegrating tablet, Take 1 tablet (4 mg total) by mouth every 8 (eight) hours as needed for nausea or vomiting., Disp: 10 tablet, Rfl: 0 .  OxyCODONE (OXYCONTIN) 80 mg T12A 12 hr tablet, Take 80 mg by mouth 3 (three) times daily., Disp: , Rfl:  .  oxyCODONE-acetaminophen (PERCOCET) 10-325 MG per tablet, Take 1 tablet by mouth every 6 (six) hours as needed for pain. , Disp: , Rfl:  .  Polyvinyl Alcohol-Povidone (CLEAR EYES ALL SEASONS) 5-6 MG/ML SOLN, Apply 2 drops to eye 3 (three) times daily as needed (drye eyes.). , Disp: , Rfl:  .  potassium chloride SA (K-DUR,KLOR-CON) 20 MEQ tablet, Take 1 tablet (20 mEq total)  by mouth 2 (two) times daily., Disp: 60 tablet, Rfl: 6 .  prochlorperazine (COMPAZINE) 10 MG tablet, Take 1 tablet (10 mg total) by mouth every 6 (six) hours as needed for nausea or vomiting., Disp: 60 tablet, Rfl: 2 .  Sennosides (LAXATIVE PILLS PO), Take 1 tablet by mouth daily as needed (constipation)., Disp: , Rfl:  .  sildenafil (VIAGRA) 100 MG tablet, Take 100 mg by mouth., Disp: , Rfl:  .  Tamsulosin HCl (FLOMAX) 0.4 MG CAPS, Take 0.4 mg by mouth daily. , Disp: , Rfl:  .  triamterene-hydrochlorothiazide (MAXZIDE-25) 37.5-25 MG per tablet, Take 1 tablet by mouth daily.  , Disp: , Rfl:  .  Vitamin D, Ergocalciferol, (DRISDOL) 50000 UNITS CAPS capsule, Take 50,000 Units by mouth every 7 (seven) days., Disp: , Rfl:  No current facility-administered medications for this visit.   Facility-Administered Medications Ordered in Other Visits:  .  morphine 4 MG/ML injection 4 mg, 4 mg, Intravenous, Q4H PRN, Volanda Napoleon, MD  Allergies:  Allergies  Allergen Reactions  . Iodine Anxiety and Rash    Didn't feel right "instructed not to take per MD--something with his port"    Past Medical History, Surgical history, Social history, and Family History were reviewed and updated.  Review of Systems: As above  Physical Exam:  weight is 197 lb 3.2 oz (89.4 kg). His oral temperature is 97.9 F (36.6 C). His blood pressure is 160/70 (abnormal) and his pulse is 81. His respiration is 20.   His head and neck exam shows no ocular or oral lesions. He has no adenopathy in the neck. His thyroid is non-palpable. Lungs are clear. Cardiac exam regular rate and rhythm with no murmurs, rubs or bruits.. Abdomen is soft. His abdomen is slightly distended. He has good bowel sounds. There is no fluid wave. There is no palpable liver or spleen tip. Back exam shows no tenderness in the lumbar spine. There is no paravertebral muscle spasms. . He has slightly decreased range of motion of the lower back. Extremities  shows no clubbing cyanosis or edema.He does have a swelling in the posterior right shoulder. There is some tenderness in this area to palpation. He has fairly decent range of motion of the right arm. He has decent strength in his legs. Skin exam shows no rashes. Neurological exam is nonfocal.  Lab Results  Component Value Date   WBC 8.7 01/13/2016   HGB 13.7 01/13/2016   HCT 40.1 01/13/2016   MCV 73 (L) 01/13/2016   PLT 225 01/13/2016     Chemistry      Component Value Date/Time   NA 132 12/09/2015 7169  K 4.0 12/09/2015 0833   CL 100 12/09/2015 0833   CO2 28 12/09/2015 0833   BUN 19 12/09/2015 0833   CREATININE 2.3 (H) 12/09/2015 0833      Component Value Date/Time   CALCIUM 8.5 12/09/2015 0833   ALKPHOS 89 (H) 12/09/2015 0833   AST 22 12/09/2015 0833   ALT 24 12/09/2015 0833   BILITOT 0.50 12/09/2015 0833         Impression and Plan: Mr. Gobert is a 65 year old gentleman. He has multiple medical problems. I still do not think that myeloma is an issue. With his monoclonal spike holding  steady, I still do not think there is any indication that his myeloma needs to be treated.  Overall, I think his quality of life is doing a little better.  For now, I think that we can hold off on any Aranesp or iron. I do not think that he has had iron for 3-4 years.  As always, we will get him back to see me in 2 more months. He'll get his Aredia today.  Volanda Napoleon, MD 11/20/20179:09 AM

## 2016-01-14 LAB — KAPPA/LAMBDA LIGHT CHAINS
Ig Kappa Free Light Chain: 34.7 mg/L — ABNORMAL HIGH (ref 3.3–19.4)
Ig Lambda Free Light Chain: 22.1 mg/L (ref 5.7–26.3)
KAPPA/LAMBDA FLC RATIO: 1.57 (ref 0.26–1.65)

## 2016-01-14 LAB — IGG, IGA, IGM
IGM (IMMUNOGLOBIN M), SRM: 71 mg/dL (ref 20–172)
IgA, Qn, Serum: 335 mg/dL (ref 61–437)
IgG, Qn, Serum: 2054 mg/dL — ABNORMAL HIGH (ref 700–1600)

## 2016-01-14 LAB — TESTOSTERONE: TESTOSTERONE: 826 ng/dL (ref 264–916)

## 2016-01-15 LAB — PROTEIN ELECTROPHORESIS, SERUM, WITH REFLEX
A/G Ratio: 1.1 (ref 0.7–1.7)
ALBUMIN: 4.1 g/dL (ref 2.9–4.4)
ALPHA 1: 0.2 g/dL (ref 0.0–0.4)
ALPHA 2: 0.8 g/dL (ref 0.4–1.0)
BETA: 0.9 g/dL (ref 0.7–1.3)
GLOBULIN, TOTAL: 3.8 g/dL (ref 2.2–3.9)
Gamma Globulin: 1.9 g/dL — ABNORMAL HIGH (ref 0.4–1.8)
Interpretation(See Below): 0
M-Spike, %: 1 g/dL — ABNORMAL HIGH
Total Protein: 7.9 g/dL (ref 6.0–8.5)

## 2016-01-27 ENCOUNTER — Ambulatory Visit (HOSPITAL_BASED_OUTPATIENT_CLINIC_OR_DEPARTMENT_OTHER): Payer: Medicare Other

## 2016-01-27 ENCOUNTER — Other Ambulatory Visit: Payer: Self-pay

## 2016-01-27 VITALS — BP 158/86 | HR 92 | Temp 98.1°F | Resp 20

## 2016-01-27 DIAGNOSIS — E291 Testicular hypofunction: Secondary | ICD-10-CM

## 2016-01-27 DIAGNOSIS — E349 Endocrine disorder, unspecified: Secondary | ICD-10-CM

## 2016-01-27 MED ORDER — TESTOSTERONE CYPIONATE 200 MG/ML IM SOLN
300.0000 mg | INTRAMUSCULAR | Status: DC
  Administered 2016-01-27: 300 mg via INTRAMUSCULAR

## 2016-01-27 MED ORDER — TESTOSTERONE CYPIONATE 200 MG/ML IM SOLN
INTRAMUSCULAR | Status: AC
  Filled 2016-01-27: qty 2

## 2016-01-27 NOTE — Patient Instructions (Signed)
Testosterone injection What is this medicine? TESTOSTERONE (tes TOS ter one) is the main male hormone. It supports normal male development such as muscle growth, facial hair, and deep voice. It is used in males to treat low testosterone levels. This medicine may be used for other purposes; ask your health care provider or pharmacist if you have questions. COMMON BRAND NAME(S): Andro-L.A., Aveed, Delatestryl, Depo-Testosterone, Virilon What should I tell my health care provider before I take this medicine? They need to know if you have any of these conditions: -cancer -diabetes -heart disease -kidney disease -liver disease -lung disease -prostate disease -an unusual or allergic reaction to testosterone, other medicines, foods, dyes, or preservatives -pregnant or trying to get pregnant -breast-feeding How should I use this medicine? This medicine is for injection into a muscle. It is usually given by a health care professional in a hospital or clinic setting. Contact your pediatrician regarding the use of this medicine in children. While this medicine may be prescribed for children as young as 12 years of age for selected conditions, precautions do apply. Overdosage: If you think you have taken too much of this medicine contact a poison control center or emergency room at once. NOTE: This medicine is only for you. Do not share this medicine with others. What if I miss a dose? Try not to miss a dose. Your doctor or health care professional will tell you when your next injection is due. Notify the office if you are unable to keep an appointment. What may interact with this medicine? -medicines for diabetes -medicines that treat or prevent blood clots like warfarin -oxyphenbutazone -propranolol -steroid medicines like prednisone or cortisone This list may not describe all possible interactions. Give your health care provider a list of all the medicines, herbs, non-prescription drugs, or  dietary supplements you use. Also tell them if you smoke, drink alcohol, or use illegal drugs. Some items may interact with your medicine. What should I watch for while using this medicine? Visit your doctor or health care professional for regular checks on your progress. They will need to check the level of testosterone in your blood. This medicine is only approved for use in men who have low levels of testosterone related to certain medical conditions. Heart attacks and strokes have been reported with the use of this medicine. Notify your doctor or health care professional and seek emergency treatment if you develop breathing problems; changes in vision; confusion; chest pain or chest tightness; sudden arm pain; severe, sudden headache; trouble speaking or understanding; sudden numbness or weakness of the face, arm or leg; loss of balance or coordination. Talk to your doctor about the risks and benefits of this medicine. This medicine may affect blood sugar levels. If you have diabetes, check with your doctor or health care professional before you change your diet or the dose of your diabetic medicine. Testosterone injections are not commonly used in women. Women should inform their doctor if they wish to become pregnant or think they might be pregnant. There is a potential for serious side effects to an unborn child. Talk to your health care professional or pharmacist for more information. Talk with your doctor or health care professional about your birth control options while taking this medicine. This drug is banned from use in athletes by most athletic organizations. What side effects may I notice from receiving this medicine? Side effects that you should report to your doctor or health care professional as soon as possible: -allergic reactions like skin rash,   itching or hives, swelling of the face, lips, or tongue -breast enlargement -breathing problems -changes in emotions or moods -deep or  hoarse voice -irregular menstrual periods -signs and symptoms of liver injury like dark yellow or brown urine; general ill feeling or flu-like symptoms; light-colored stools; loss of appetite; nausea; right upper belly pain; unusually weak or tired; yellowing of the eyes or skin -stomach pain -swelling of the ankles, feet, hands -too frequent or persistent erections -trouble passing urine or change in the amount of urine Side effects that usually do not require medical attention (report to your doctor or health care professional if they continue or are bothersome): -acne -change in sex drive or performance -facial hair growth -hair loss -headache This list may not describe all possible side effects. Call your doctor for medical advice about side effects. You may report side effects to FDA at 1-800-FDA-1088. Where should I keep my medicine? Keep out of the reach of children. This medicine can be abused. Keep your medicine in a safe place to protect it from theft. Do not share this medicine with anyone. Selling or giving away this medicine is dangerous and against the law. Store at room temperature between 20 and 25 degrees C (68 and 77 degrees F). Do not freeze. Protect from light. Follow the directions for the product you are prescribed. Throw away any unused medicine after the expiration date. NOTE: This sheet is a summary. It may not cover all possible information. If you have questions about this medicine, talk to your doctor, pharmacist, or health care provider.  2017 [employer] Standard (2015-03-16 07:33:55)

## 2016-02-07 ENCOUNTER — Other Ambulatory Visit: Payer: Self-pay | Admitting: *Deleted

## 2016-02-07 DIAGNOSIS — C9000 Multiple myeloma not having achieved remission: Secondary | ICD-10-CM

## 2016-02-10 ENCOUNTER — Ambulatory Visit (HOSPITAL_BASED_OUTPATIENT_CLINIC_OR_DEPARTMENT_OTHER): Payer: Medicare Other

## 2016-02-10 ENCOUNTER — Ambulatory Visit: Payer: Medicare Other

## 2016-02-10 ENCOUNTER — Other Ambulatory Visit (HOSPITAL_BASED_OUTPATIENT_CLINIC_OR_DEPARTMENT_OTHER): Payer: Medicare Other

## 2016-02-10 ENCOUNTER — Other Ambulatory Visit: Payer: Self-pay | Admitting: Family

## 2016-02-10 VITALS — BP 163/73 | HR 83 | Temp 98.4°F | Resp 18

## 2016-02-10 DIAGNOSIS — E349 Endocrine disorder, unspecified: Secondary | ICD-10-CM | POA: Diagnosis present

## 2016-02-10 DIAGNOSIS — C9000 Multiple myeloma not having achieved remission: Secondary | ICD-10-CM

## 2016-02-10 DIAGNOSIS — Z299 Encounter for prophylactic measures, unspecified: Secondary | ICD-10-CM

## 2016-02-10 DIAGNOSIS — N189 Chronic kidney disease, unspecified: Secondary | ICD-10-CM | POA: Diagnosis not present

## 2016-02-10 DIAGNOSIS — D631 Anemia in chronic kidney disease: Secondary | ICD-10-CM | POA: Diagnosis not present

## 2016-02-10 DIAGNOSIS — R52 Pain, unspecified: Secondary | ICD-10-CM

## 2016-02-10 LAB — CBC WITH DIFFERENTIAL (CANCER CENTER ONLY)
BASO#: 0.1 10*3/uL (ref 0.0–0.2)
BASO%: 0.7 % (ref 0.0–2.0)
EOS%: 2.2 % (ref 0.0–7.0)
Eosinophils Absolute: 0.2 10*3/uL (ref 0.0–0.5)
HCT: 40.4 % (ref 38.7–49.9)
HGB: 14.3 g/dL (ref 13.0–17.1)
LYMPH#: 1.5 10*3/uL (ref 0.9–3.3)
LYMPH%: 22.3 % (ref 14.0–48.0)
MCH: 25.6 pg — ABNORMAL LOW (ref 28.0–33.4)
MCHC: 35.4 g/dL (ref 32.0–35.9)
MCV: 72 fL — ABNORMAL LOW (ref 82–98)
MONO#: 0.8 10*3/uL (ref 0.1–0.9)
MONO%: 11.4 % (ref 0.0–13.0)
NEUT#: 4.2 10*3/uL (ref 1.5–6.5)
NEUT%: 63.4 % (ref 40.0–80.0)
PLATELETS: 245 10*3/uL (ref 145–400)
RBC: 5.59 10*6/uL (ref 4.20–5.70)
RDW: 16 % — AB (ref 11.1–15.7)
WBC: 6.7 10*3/uL (ref 4.0–10.0)

## 2016-02-10 LAB — CMP (CANCER CENTER ONLY)
ALK PHOS: 82 U/L (ref 26–84)
ALT: 21 U/L (ref 10–47)
AST: 27 U/L (ref 11–38)
Albumin: 3.7 g/dL (ref 3.3–5.5)
BILIRUBIN TOTAL: 0.6 mg/dL (ref 0.20–1.60)
BUN: 19 mg/dL (ref 7–22)
CO2: 27 meq/L (ref 18–33)
Calcium: 9.4 mg/dL (ref 8.0–10.3)
Chloride: 100 mEq/L (ref 98–108)
Creat: 2.5 mg/dl — ABNORMAL HIGH (ref 0.6–1.2)
GLUCOSE: 129 mg/dL — AB (ref 73–118)
Potassium: 3.3 mEq/L (ref 3.3–4.7)
SODIUM: 140 meq/L (ref 128–145)
Total Protein: 8.5 g/dL — ABNORMAL HIGH (ref 6.4–8.1)

## 2016-02-10 MED ORDER — SODIUM CHLORIDE 0.9 % IV SOLN
60.0000 mg | Freq: Once | INTRAVENOUS | Status: AC
  Administered 2016-02-10: 60 mg via INTRAVENOUS
  Filled 2016-02-10: qty 10

## 2016-02-10 MED ORDER — TESTOSTERONE CYPIONATE 200 MG/ML IM SOLN
INTRAMUSCULAR | Status: AC
  Filled 2016-02-10: qty 2

## 2016-02-10 MED ORDER — DARBEPOETIN ALFA-POLYSORBATE 300 MCG/0.6ML IJ SOLN: 300.0000 ug | Freq: Once | INTRAMUSCULAR | Status: DC

## 2016-02-10 MED ORDER — HYDROMORPHONE HCL 1 MG/ML IJ SOLN
INTRAMUSCULAR | Status: AC
  Filled 2016-02-10: qty 1

## 2016-02-10 MED ORDER — HYDROMORPHONE HCL 4 MG/ML IJ SOLN
1.0000 mg | Freq: Once | INTRAMUSCULAR | Status: AC
  Administered 2016-02-10: 1 mg via INTRAVENOUS

## 2016-02-10 MED ORDER — TESTOSTERONE CYPIONATE 200 MG/ML IM SOLN
300.0000 mg | INTRAMUSCULAR | Status: DC
  Administered 2016-02-10: 300 mg via INTRAMUSCULAR

## 2016-02-10 NOTE — Patient Instructions (Signed)
Testosterone injection What is this medicine? TESTOSTERONE (tes TOS ter one) is the main male hormone. It supports normal male development such as muscle growth, facial hair, and deep voice. It is used in males to treat low testosterone levels. This medicine may be used for other purposes; ask your health care provider or pharmacist if you have questions. COMMON BRAND NAME(S): Andro-L.A., Aveed, Delatestryl, Depo-Testosterone, Virilon What should I tell my health care provider before I take this medicine? They need to know if you have any of these conditions: -cancer -diabetes -heart disease -kidney disease -liver disease -lung disease -prostate disease -an unusual or allergic reaction to testosterone, other medicines, foods, dyes, or preservatives -pregnant or trying to get pregnant -breast-feeding How should I use this medicine? This medicine is for injection into a muscle. It is usually given by a health care professional in a hospital or clinic setting. Contact your pediatrician regarding the use of this medicine in children. While this medicine may be prescribed for children as young as 12 years of age for selected conditions, precautions do apply. Overdosage: If you think you have taken too much of this medicine contact a poison control center or emergency room at once. NOTE: This medicine is only for you. Do not share this medicine with others. What if I miss a dose? Try not to miss a dose. Your doctor or health care professional will tell you when your next injection is due. Notify the office if you are unable to keep an appointment. What may interact with this medicine? -medicines for diabetes -medicines that treat or prevent blood clots like warfarin -oxyphenbutazone -propranolol -steroid medicines like prednisone or cortisone This list may not describe all possible interactions. Give your health care provider a list of all the medicines, herbs, non-prescription drugs, or  dietary supplements you use. Also tell them if you smoke, drink alcohol, or use illegal drugs. Some items may interact with your medicine. What should I watch for while using this medicine? Visit your doctor or health care professional for regular checks on your progress. They will need to check the level of testosterone in your blood. This medicine is only approved for use in men who have low levels of testosterone related to certain medical conditions. Heart attacks and strokes have been reported with the use of this medicine. Notify your doctor or health care professional and seek emergency treatment if you develop breathing problems; changes in vision; confusion; chest pain or chest tightness; sudden arm pain; severe, sudden headache; trouble speaking or understanding; sudden numbness or weakness of the face, arm or leg; loss of balance or coordination. Talk to your doctor about the risks and benefits of this medicine. This medicine may affect blood sugar levels. If you have diabetes, check with your doctor or health care professional before you change your diet or the dose of your diabetic medicine. Testosterone injections are not commonly used in women. Women should inform their doctor if they wish to become pregnant or think they might be pregnant. There is a potential for serious side effects to an unborn child. Talk to your health care professional or pharmacist for more information. Talk with your doctor or health care professional about your birth control options while taking this medicine. This drug is banned from use in athletes by most athletic organizations. What side effects may I notice from receiving this medicine? Side effects that you should report to your doctor or health care professional as soon as possible: -allergic reactions like skin rash,   itching or hives, swelling of the face, lips, or tongue -breast enlargement -breathing problems -changes in emotions or moods -deep or  hoarse voice -irregular menstrual periods -signs and symptoms of liver injury like dark yellow or brown urine; general ill feeling or flu-like symptoms; light-colored stools; loss of appetite; nausea; right upper belly pain; unusually weak or tired; yellowing of the eyes or skin -stomach pain -swelling of the ankles, feet, hands -too frequent or persistent erections -trouble passing urine or change in the amount of urine Side effects that usually do not require medical attention (report to your doctor or health care professional if they continue or are bothersome): -acne -change in sex drive or performance -facial hair growth -hair loss -headache This list may not describe all possible side effects. Call your doctor for medical advice about side effects. You may report side effects to FDA at 1-800-FDA-1088. Where should I keep my medicine? Keep out of the reach of children. This medicine can be abused. Keep your medicine in a safe place to protect it from theft. Do not share this medicine with anyone. Selling or giving away this medicine is dangerous and against the law. Store at room temperature between 20 and 25 degrees C (68 and 77 degrees F). Do not freeze. Protect from light. Follow the directions for the product you are prescribed. Throw away any unused medicine after the expiration date. NOTE: This sheet is a summary. It may not cover all possible information. If you have questions about this medicine, talk to your doctor, pharmacist, or health care provider.  2017 Elsevier/Gold Standard (2015-03-16 07:33:55) Pamidronate injection What is this medicine? PAMIDRONATE (pa mi DROE nate) slows calcium loss from bones. It is used to treat high calcium blood levels from cancer or Paget's disease. It is also used to treat bone pain and prevent fractures from certain cancers that have spread to the bone. This medicine may be used for other purposes; ask your health care provider or pharmacist if  you have questions. COMMON BRAND NAME(S): Aredia What should I tell my health care provider before I take this medicine? They need to know if you have any of these conditions: -aspirin-sensitive asthma -dental disease -kidney disease -an unusual or allergic reaction to pamidronate, other medicines, foods, dyes, or preservatives -pregnant or trying to get pregnant -breast-feeding How should I use this medicine? This medicine is for infusion into a vein. It is given by a health care professional in a hospital or clinic setting. Talk to your pediatrician regarding the use of this medicine in children. This medicine is not approved for use in children. Overdosage: If you think you have taken too much of this medicine contact a poison control center or emergency room at once. NOTE: This medicine is only for you. Do not share this medicine with others. What if I miss a dose? This does not apply. What may interact with this medicine? -certain antibiotics given by injection -medicines for inflammation or pain like ibuprofen, naproxen -some diuretics like bumetanide, furosemide -cyclosporine -parathyroid hormone -tacrolimus -teriparatide -thalidomide This list may not describe all possible interactions. Give your health care provider a list of all the medicines, herbs, non-prescription drugs, or dietary supplements you use. Also tell them if you smoke, drink alcohol, or use illegal drugs. Some items may interact with your medicine. What should I watch for while using this medicine? Visit your doctor or health care professional for regular checkups. It may be some time before you see the benefit from this  medicine. Do not stop taking your medicine unless your doctor tells you to. Your doctor may order blood tests or other tests to see how you are doing. Women should inform their doctor if they wish to become pregnant or think they might be pregnant. There is a potential for serious side effects to  an unborn child. Talk to your health care professional or pharmacist for more information. You should make sure that you get enough calcium and vitamin D while you are taking this medicine. Discuss the foods you eat and the vitamins you take with your health care professional. Some people who take this medicine have severe bone, joint, and/or muscle pain. This medicine may also increase your risk for a broken thigh bone. Tell your doctor right away if you have pain in your upper leg or groin. Tell your doctor if you have any pain that does not go away or that gets worse. What side effects may I notice from receiving this medicine? Side effects that you should report to your doctor or health care professional as soon as possible: -allergic reactions like skin rash, itching or hives, swelling of the face, lips, or tongue -black or tarry stools -changes in vision -eye inflammation, pain -high blood pressure -jaw pain, especially burning or cramping -muscle weakness -numb, tingling pain -swelling of feet or hands -trouble passing urine or change in the amount of urine -unable to move easily Side effects that usually do not require medical attention (report to your doctor or health care professional if they continue or are bothersome): -bone, joint, or muscle pain -constipation -dizzy, drowsy -fever -headache -loss of appetite -nausea, vomiting -pain at site where injected This list may not describe all possible side effects. Call your doctor for medical advice about side effects. You may report side effects to FDA at 1-800-FDA-1088. Where should I keep my medicine? This drug is given in a hospital or clinic and will not be stored at home. NOTE: This sheet is a summary. It may not cover all possible information. If you have questions about this medicine, talk to your doctor, pharmacist, or health care provider.  2017 Elsevier/Gold Standard (2010-08-08 08:49:49)

## 2016-02-11 ENCOUNTER — Ambulatory Visit (INDEPENDENT_AMBULATORY_CARE_PROVIDER_SITE_OTHER): Payer: Medicare Other | Admitting: Podiatry

## 2016-02-11 ENCOUNTER — Encounter: Payer: Self-pay | Admitting: Podiatry

## 2016-02-11 DIAGNOSIS — Q828 Other specified congenital malformations of skin: Secondary | ICD-10-CM

## 2016-02-11 NOTE — Progress Notes (Signed)
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 Left foot seems to bother him the most he states as has the worst pain in this foot as he refers to the posterior aspect of the left heel and the plantar lateral aspect of the fifth metatarsal.  Objective: Vital signs are stable he's alert and oriented 3 neurologic sensorium is diminished per Semmes-Weinstein monofilament. Porokeratosis are present no open lesions or wounds.  Assessment: Porokeratosis.  Plan: Debridement and reactive hyperkeratosis follow up with him in 2 months.

## 2016-02-25 ENCOUNTER — Ambulatory Visit (HOSPITAL_BASED_OUTPATIENT_CLINIC_OR_DEPARTMENT_OTHER): Payer: Medicare Other

## 2016-02-25 VITALS — BP 164/80 | HR 81 | Temp 97.9°F | Resp 20

## 2016-02-25 DIAGNOSIS — E291 Testicular hypofunction: Secondary | ICD-10-CM | POA: Diagnosis not present

## 2016-02-25 DIAGNOSIS — E349 Endocrine disorder, unspecified: Secondary | ICD-10-CM

## 2016-02-25 MED ORDER — TESTOSTERONE CYPIONATE 200 MG/ML IM SOLN
300.0000 mg | INTRAMUSCULAR | Status: DC
  Administered 2016-02-25: 300 mg via INTRAMUSCULAR

## 2016-02-25 MED ORDER — TESTOSTERONE CYPIONATE 200 MG/ML IM SOLN
INTRAMUSCULAR | Status: AC
  Filled 2016-02-25: qty 2

## 2016-02-25 NOTE — Patient Instructions (Signed)
Testosterone injection What is this medicine? TESTOSTERONE (tes TOS ter one) is the main male hormone. It supports normal male development such as muscle growth, facial hair, and deep voice. It is used in males to treat low testosterone levels. This medicine may be used for other purposes; ask your health care provider or pharmacist if you have questions. COMMON BRAND NAME(S): Andro-L.A., Aveed, Delatestryl, Depo-Testosterone, Virilon What should I tell my health care provider before I take this medicine? They need to know if you have any of these conditions: -cancer -diabetes -heart disease -kidney disease -liver disease -lung disease -prostate disease -an unusual or allergic reaction to testosterone, other medicines, foods, dyes, or preservatives -pregnant or trying to get pregnant -breast-feeding How should I use this medicine? This medicine is for injection into a muscle. It is usually given by a health care professional in a hospital or clinic setting. Contact your pediatrician regarding the use of this medicine in children. While this medicine may be prescribed for children as young as 12 years of age for selected conditions, precautions do apply. Overdosage: If you think you have taken too much of this medicine contact a poison control center or emergency room at once. NOTE: This medicine is only for you. Do not share this medicine with others. What if I miss a dose? Try not to miss a dose. Your doctor or health care professional will tell you when your next injection is due. Notify the office if you are unable to keep an appointment. What may interact with this medicine? -medicines for diabetes -medicines that treat or prevent blood clots like warfarin -oxyphenbutazone -propranolol -steroid medicines like prednisone or cortisone This list may not describe all possible interactions. Give your health care provider a list of all the medicines, herbs, non-prescription drugs, or  dietary supplements you use. Also tell them if you smoke, drink alcohol, or use illegal drugs. Some items may interact with your medicine. What should I watch for while using this medicine? Visit your doctor or health care professional for regular checks on your progress. They will need to check the level of testosterone in your blood. This medicine is only approved for use in men who have low levels of testosterone related to certain medical conditions. Heart attacks and strokes have been reported with the use of this medicine. Notify your doctor or health care professional and seek emergency treatment if you develop breathing problems; changes in vision; confusion; chest pain or chest tightness; sudden arm pain; severe, sudden headache; trouble speaking or understanding; sudden numbness or weakness of the face, arm or leg; loss of balance or coordination. Talk to your doctor about the risks and benefits of this medicine. This medicine may affect blood sugar levels. If you have diabetes, check with your doctor or health care professional before you change your diet or the dose of your diabetic medicine. Testosterone injections are not commonly used in women. Women should inform their doctor if they wish to become pregnant or think they might be pregnant. There is a potential for serious side effects to an unborn child. Talk to your health care professional or pharmacist for more information. Talk with your doctor or health care professional about your birth control options while taking this medicine. This drug is banned from use in athletes by most athletic organizations. What side effects may I notice from receiving this medicine? Side effects that you should report to your doctor or health care professional as soon as possible: -allergic reactions like skin rash,   itching or hives, swelling of the face, lips, or tongue -breast enlargement -breathing problems -changes in emotions or moods -deep or  hoarse voice -irregular menstrual periods -signs and symptoms of liver injury like dark yellow or brown urine; general ill feeling or flu-like symptoms; light-colored stools; loss of appetite; nausea; right upper belly pain; unusually weak or tired; yellowing of the eyes or skin -stomach pain -swelling of the ankles, feet, hands -too frequent or persistent erections -trouble passing urine or change in the amount of urine Side effects that usually do not require medical attention (report to your doctor or health care professional if they continue or are bothersome): -acne -change in sex drive or performance -facial hair growth -hair loss -headache This list may not describe all possible side effects. Call your doctor for medical advice about side effects. You may report side effects to FDA at 1-800-FDA-1088. Where should I keep my medicine? Keep out of the reach of children. This medicine can be abused. Keep your medicine in a safe place to protect it from theft. Do not share this medicine with anyone. Selling or giving away this medicine is dangerous and against the law. Store at room temperature between 20 and 25 degrees C (68 and 77 degrees F). Do not freeze. Protect from light. Follow the directions for the product you are prescribed. Throw away any unused medicine after the expiration date. NOTE: This sheet is a summary. It may not cover all possible information. If you have questions about this medicine, talk to your doctor, pharmacist, or health care provider.  2017 [employer] Standard (2015-03-16 07:33:55)

## 2016-03-03 ENCOUNTER — Encounter: Payer: Self-pay | Admitting: Neurology

## 2016-03-03 ENCOUNTER — Ambulatory Visit (INDEPENDENT_AMBULATORY_CARE_PROVIDER_SITE_OTHER): Payer: Medicare Other | Admitting: Neurology

## 2016-03-03 VITALS — BP 160/90 | HR 100 | Ht 71.0 in | Wt 202.0 lb

## 2016-03-03 DIAGNOSIS — M62542 Muscle wasting and atrophy, not elsewhere classified, left hand: Secondary | ICD-10-CM | POA: Diagnosis not present

## 2016-03-03 DIAGNOSIS — R531 Weakness: Secondary | ICD-10-CM

## 2016-03-03 DIAGNOSIS — R292 Abnormal reflex: Secondary | ICD-10-CM | POA: Diagnosis not present

## 2016-03-03 DIAGNOSIS — G5139 Clonic hemifacial spasm, unspecified: Secondary | ICD-10-CM

## 2016-03-03 DIAGNOSIS — E0842 Diabetes mellitus due to underlying condition with diabetic polyneuropathy: Secondary | ICD-10-CM

## 2016-03-03 DIAGNOSIS — G513 Clonic hemifacial spasm: Secondary | ICD-10-CM

## 2016-03-03 NOTE — Progress Notes (Signed)
[facility] Clinic Note - Initial Visit   Date: 03/03/16  [name] MRN: [id number] DOB: August 10, 1950   Dear Dr. Jonelle Sidle:   Thank you for your kind referral of [name] for consultation of Bell's palsy. Although his history is well known to you, please allow [place] to reiterate it for the purpose of our medical record. The patient was accompanied to the clinic by self who also provides collateral information.     History of Present Illness: [name] is a 66 y.o. 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    Patient is a poor historian.  When asked about his referral today, he reports complaining of left facial swelling and left-sided weakness. His facial swelling started around summer of 2017, and is worse in the morning and improves as the day goes on. He specifically denies any facial weakness, eyelid droopiness, or drooping of the mouth.  Review of his referral notes indicates he had Bell's palsy in December, however patient denies having facial weakness. Specifically, he says that his face swells up and he has occasional twitching of the left eye and cheek, the twitching has been present for the past 2 years. There is no facial pain or paresthesias. He cannot controlled twitches.  He also complains of left arm and leg weakness for the past 2-3 years.   He has weakness with grip on the left and feels that his foot is weak. He walks unassisted and has not suffered any falls. He denies any low back pain. MRI brain in March 2017 did not show any structural changes of the brain. He also had a MRI of the lumbar spine in March 2017 which showed right foraminal disc protrusion at L4-L5 affecting the right L4 nerve and left L5-S1,  affecting the left S1 nerve.   He endorses bilateral leg numbness and whole body "bone" pain. He does have a of diabetic neuropathy.  He is on a number of pain medications for this.  Out-side paper records, electronic medical record, and images have been reviewed where available and summarized as:  MRI brain wo contrast 05/19/2015:   1. Stable and normal for age noncontrast MRI appearance of the brain. 2. Stable bone marrow signal since 2013, no evidence of multiple myeloma involvement of the skull.  MRI lumbar spine wo contrast 05/19/2015: 1. Chronic diffuse abnormal marrow signal from the lower thoracic spine to the pelvis is stable since 2015. This is nonspecific, with considerations including chronic multiple myeloma and renal osteodystrophy. No pathologic fracture or marrow edema identified. 2. Mild for age lumbar spine degeneration with no spinal stenosis. 3. Chronic right foraminal disc protrusion at L4-L5 affecting the exiting right L4 nerve. Stable chronic small left lateral recess disc protrusion at L5-S1 which could be a source for left S1 radiculitis. Mild left L5 foraminal stenosis also appears stable. 4. Moderate distension of the urinary bladder.  Lab Results  Component Value Date   TSH 0.91 06/17/2011   Lab Results  Component Value Date   HGBA1C 8.1 (H) 07/12/2011     Past Medical History:  Diagnosis Date  . Anemia   . 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 Anemia, iron deficiency 03/12/2011  . Anxiety disorder   . Arthralgia of multiple joints 12/31/2010  . Arthritis   . Constipation   . Diabetes  mellitus without complication ([place])   . Diabetic neuropathy ([place])   . Diverticulosis   . Hemorrhoids   . Herpes zoster   . Hyperlipidemia   . Hypertension   . Hypotestosteronism 03/17/2012  . Multiple myeloma   . Multiple myeloma ([place])   . Myeloma ([place]) 12/31/2010  . Renal insufficiency   . Type 2 diabetes mellitus ([place])   . Urinary retention     Past Surgical  History:  Procedure Laterality Date  . CATARACT EXTRACTION     bi-lateral  . COLONOSCOPY    . COLONOSCOPY  07/13/2011   Procedure: COLONOSCOPY;  Surgeon: Ladene Artist, MD,FACG;  Location: [facility];  Service: Endoscopy;  Laterality: N/A;  . ESOPHAGOGASTRODUODENOSCOPY    . KNEE ARTHROSCOPY     left  . KNEE SURGERY    . PORTACATH PLACEMENT    . TRANSURETHRAL RESECTION OF PROSTATE  01/2011   Dr. at [facility] in [place]     Medications:  Outpatient Encounter Prescriptions as of 03/03/2016  Medication Sig Note  . amLODipine (NORVASC) 10 MG tablet Take 10 mg by mouth daily.   Marland Kitchen CARAFATE 1 GM/10ML suspension Take 1 g by mouth 4 (four) times daily.    . diazepam (VALIUM) 5 MG tablet Take 5 mg by mouth every 4 (four) hours as needed for anxiety.   . dicyclomine (BENTYL) 10 MG capsule Take 1 capsule (10 mg total) by mouth 3 (three) times daily.   . furosemide (LASIX) 40 MG tablet Take 40 mg by mouth.   Marland Kitchen glipiZIDE (GLUCOTROL) 5 MG tablet Take 5 mg by mouth daily before breakfast.   . Hypromellose (NATURAL BALANCE TEARS OP) Apply 1 drop to eye every morning.    . Linaclotide (LINZESS) 145 MCG CAPS capsule Take 1 capsule (145 mcg total) by mouth daily before breakfast.   . meclizine (ANTIVERT) 50 MG tablet Take 0.5 tablets (25 mg total) by mouth 4 (four) times daily as needed for dizziness.   . meloxicam (MOBIC) 15 MG tablet Take 15 mg by mouth. 06/10/2015: Received from: [facility]  . mesalamine (LIALDA) 1.2 g EC tablet Take 1,200 mg by mouth. 09/16/2015: Received from: [facility] Received Sig: Take 1,200 mg by mouth daily with breakfast.  . methocarbamol (ROBAXIN) 500 MG tablet Take 500 mg by mouth 2 (two) times daily as needed for muscle spasms.    . metoprolol succinate (TOPROL XL) 50 MG 24 hr tablet Take 50 mg by mouth. 06/10/2015: Received from: [facility]  . NUCYNTA ER 100 MG TB12 Take 1 tablet by mouth 3  (three) times daily.   Marland Kitchen omeprazole (PRILOSEC) 40 MG capsule Take 1 capsule (40 mg total) by mouth 2 (two) times daily.   . ondansetron (ZOFRAN ODT) 4 MG disintegrating tablet Take 1 tablet (4 mg total) by mouth every 8 (eight) hours as needed for nausea or vomiting.   . OxyCODONE (OXYCONTIN) 80 mg T12A 12 hr tablet Take 80 mg by mouth 3 (three) times daily.   Marland Kitchen oxyCODONE-acetaminophen (PERCOCET) 10-325 MG per tablet Take 1 tablet by mouth every 6 (six) hours as needed for pain.    . Polyvinyl Alcohol-Povidone (CLEAR EYES ALL SEASONS) 5-6 MG/ML SOLN Apply 2 drops to eye 3 (three) times daily as needed (drye eyes.).    Marland Kitchen potassium chloride SA (K-DUR,KLOR-CON) 20 MEQ tablet Take 1 tablet (20 mEq total) by mouth 2 (two) times daily.   . prochlorperazine (COMPAZINE) 10  MG tablet Take 1 tablet (10 mg total) by mouth every 6 (six) hours as needed for nausea or vomiting.   . Sennosides (LAXATIVE PILLS PO) Take 1 tablet by mouth daily as needed (constipation).   . sildenafil (VIAGRA) 100 MG tablet Take 100 mg by mouth. 06/10/2015: Received from: [facility]  . Tamsulosin HCl (FLOMAX) 0.4 MG CAPS Take 0.4 mg by mouth daily.    Marland Kitchen triamterene-hydrochlorothiazide (MAXZIDE-25) 37.5-25 MG per tablet Take 1 tablet by mouth daily.     . Vitamin D, Ergocalciferol, (DRISDOL) 50000 UNITS CAPS capsule Take 50,000 Units by mouth every 7 (seven) days. 09/20/2014: Saturday.    Facility-Administered Encounter Medications as of 03/03/2016  Medication  . morphine 4 MG/ML injection 4 mg     Allergies:  Allergies  Allergen Reactions  . Iodine Anxiety and Rash    Didn't feel right "instructed not to take per MD--something with his port"    Family History: Family History  Problem Relation Age of Onset  . Breast cancer Sister   . Breast cancer Mother   . Heart disease Mother   . Diabetes Sister   . Bone cancer Father   . Heart disease Sister   . Heart disease Daughter   . Throat cancer  Brother   . Stomach cancer Brother   . Colon cancer Neg Hx     Social History: Social History  Substance Use Topics  . Smoking status: Never Smoker  . Smokeless tobacco: Never Used     Comment: never used tobacco  . Alcohol use No   Social History   Social History Narrative   Lives alone in a one story home. Has 3 children.  Retired Training and development officer.  Education: 12 grade.        Review of Systems:  CONSTITUTIONAL: No fevers, chills, night sweats, or weight loss.   EYES: +visual changes or eye pain ENT: No hearing changes.  No history of nose bleeds.   RESPIRATORY: No cough, wheezing and shortness of breath.   CARDIOVASCULAR: Negative for chest pain, and palpitations.   GI: Negative for abdominal discomfort, blood in stools or black stools.  No recent change in bowel habits.   GU:  No history of incontinence.   MUSCLOSKELETAL: +history of joint pain or swelling.  No myalgias.   SKIN: Negative for lesions, rash, and itching.   HEMATOLOGY/ONCOLOGY: Negative for prolonged bleeding, bruising easily, and swollen nodes.  +history of cancer.   ENDOCRINE: Negative for cold or heat intolerance, polydipsia or goiter.   PSYCH:  No depression or anxiety symptoms.   NEURO: As Above.   Vital Signs:  BP (!) 160/90   Pulse 100   Ht _0  (1.803 m)   Wt 202 lb (91.6 kg)   SpO2 97%   BMI 28.17 kg/m    Neurological Exam: MENTAL STATUS including orientation to time, place, person, recent and remote memory, attention span and concentration, language, and fund of knowledge is fair.  Speech is not dysarthric.  CRANIAL NERVES: II:  No visual field defects.  Funduscopic exam is limited.    III-IV-VI: Pupils equal round and reactive to light.  Normal conjugate, extra-ocular eye movements in all directions of gaze.  No nystagmus.  No ptosis.   V:  Normal facial sensation.   VII:  Normal facial symmetry and movements.  No pathologic facial reflexes.  Orbicularis oris, buccinator muscles, and orbicularis  oculi are 5/5.  There is intermittent twitching of the left eye and cheek.  VIII:  Normal hearing and vestibular function.   IX-X:  Normal palatal movement.   XI:  Normal shoulder shrug and head rotation.   XII:  Normal tongue strength and range of motion, no deviation or fasciculation.   MOTOR:  Moderate left FDI atrophy. No fasciculations or abnormal movements.  No pronator drift.  Tone is increased in the upper extremity (0+).    Right Upper Extremity:    Left Upper Extremity:    Deltoid  5/5   Deltoid  5/5   Biceps  5/5   Biceps  5/5   Triceps  5/5   Triceps  5/5   Wrist extensors  5/5   Wrist extensors  5/5   Wrist flexors  5/5   Wrist flexors  5/5   Finger extensors  5/5   Finger extensors  4/5   Finger flexors  5/5   Finger flexors  5/5   Dorsal interossei  5-/5   Dorsal interossei  4/5   Abductor pollicis  5-/5   Abductor pollicis  4/5   Tone (Ashworth scale)  0  Tone (Ashworth scale)  0   Right Lower Extremity:    Left Lower Extremity:    Hip flexors  5/5   Hip flexors  5/5   Hip extensors  5/5   Hip extensors  5/5   Knee flexors  5/5   Knee flexors  5/5   Knee extensors  5/5   Knee extensors  5/5   Dorsiflexors  5/5   Dorsiflexors  5/5   Plantarflexors  5/5   Plantarflexors  5/5   Toe extensors  5-/5   Toe extensors  5-/5   Toe flexors  5-/5   Toe flexors  5-/5   Tone (Ashworth scale)  0  Tone (Ashworth scale)  0   MSRs:  Right                                                                 Left brachioradialis 2+  brachioradialis 2+  biceps 2+  biceps 2+  triceps 2+  triceps 2+  patellar 0  Patellar 3+  ankle jerk 0  ankle jerk 0  Hoffman no  Hoffman no  plantar response down  plantar response down   SENSORY:  Vibration is reduced to 50% at the knees bilaterally and absent at the ankles. Pinprick and temperature is reduced below the knees bilaterally. Romberg sign is present. There is evidence of proprioceptive loss at the toes.   COORDINATION/GAIT: Normal  finger-to- nose-finger.  Intact rapid alternating movements bilaterally.  Gait mildly wide based and stable. He is able to perform stressed gait and tandem gait, but has some difficulty.   IMPRESSION: Mr. Massmann is a 66 year old gentleman with a number of medical comorbidities as mentioned above, referred for evaluation of facial weakness. I cannot appreciate any facial weakness on his exam. 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 Additionally, there is also increased tone in the upper extremities and asymmetrically brisk left patella jerk. With his diabetic neuropathy, I would expect his reflexes to be diminished. Imaging of his brain was remarkably intact from March 2017. Imaging of his lumbar spine personally reviewed from March 2017 shows foraminal disc herniation affecting the right L4  and left S1 nerve roots, however there was no spinal stenosis to explain his upper motor neuron findings. MRI cervical spine will be ordered to further evaluate his myelopathic findings. If his imaging does not show any structural changes, he may need updated MRI brain as well as electrodiagnostic testing of the left side to look for a peripheral lesion.   Regarding his left hemifacial spasm, we discussed management options, the most effective being Botox injections. He is not very bothered by the twitching and does not wish to proceed with chemodenervation.  His diabetic neuropathy is profound with predominantly sensory > motor involvement. Fall precautions were discussed. He is already on a number of pain medications which control his paresthesias.  Further recommendations will be based on the results of his imaging.   The duration of this appointment visit was 60 minutes of face-to-face time with the patient.  Greater than 50% of this time was spent in counseling, explanation of diagnosis, planning of further management, and coordination of care.   Thank you for allowing me to  participate in patient's care.  If I can answer any additional questions, I would be pleased to do so.    Sincerely,    Donika K. Posey Pronto, DO

## 2016-03-03 NOTE — Patient Instructions (Signed)
We will order MRI cervical spine without contrast and call you with the results.

## 2016-03-09 ENCOUNTER — Ambulatory Visit (HOSPITAL_BASED_OUTPATIENT_CLINIC_OR_DEPARTMENT_OTHER): Payer: Medicare Other

## 2016-03-09 VITALS — BP 173/79 | HR 88 | Temp 98.7°F | Resp 16

## 2016-03-09 DIAGNOSIS — E349 Endocrine disorder, unspecified: Secondary | ICD-10-CM

## 2016-03-09 DIAGNOSIS — E291 Testicular hypofunction: Secondary | ICD-10-CM | POA: Diagnosis present

## 2016-03-09 MED ORDER — TESTOSTERONE CYPIONATE 200 MG/ML IM SOLN
INTRAMUSCULAR | Status: AC
  Filled 2016-03-09: qty 2

## 2016-03-09 MED ORDER — TESTOSTERONE CYPIONATE 200 MG/ML IM SOLN
300.0000 mg | INTRAMUSCULAR | Status: DC
  Administered 2016-03-09: 300 mg via INTRAMUSCULAR

## 2016-03-09 NOTE — Patient Instructions (Signed)
Testosterone injection What is this medicine? TESTOSTERONE (tes TOS ter one) is the main male hormone. It supports normal male development such as muscle growth, facial hair, and deep voice. It is used in males to treat low testosterone levels. This medicine may be used for other purposes; ask your health care provider or pharmacist if you have questions. COMMON BRAND NAME(S): Andro-L.A., Aveed, Delatestryl, Depo-Testosterone, Virilon What should I tell my health care provider before I take this medicine? They need to know if you have any of these conditions: -cancer -diabetes -heart disease -kidney disease -liver disease -lung disease -prostate disease -an unusual or allergic reaction to testosterone, other medicines, foods, dyes, or preservatives -pregnant or trying to get pregnant -breast-feeding How should I use this medicine? This medicine is for injection into a muscle. It is usually given by a health care professional in a hospital or clinic setting. Contact your pediatrician regarding the use of this medicine in children. While this medicine may be prescribed for children as young as 12 years of age for selected conditions, precautions do apply. Overdosage: If you think you have taken too much of this medicine contact a poison control center or emergency room at once. NOTE: This medicine is only for you. Do not share this medicine with others. What if I miss a dose? Try not to miss a dose. Your doctor or health care professional will tell you when your next injection is due. Notify the office if you are unable to keep an appointment. What may interact with this medicine? -medicines for diabetes -medicines that treat or prevent blood clots like warfarin -oxyphenbutazone -propranolol -steroid medicines like prednisone or cortisone This list may not describe all possible interactions. Give your health care provider a list of all the medicines, herbs, non-prescription drugs, or  dietary supplements you use. Also tell them if you smoke, drink alcohol, or use illegal drugs. Some items may interact with your medicine. What should I watch for while using this medicine? Visit your doctor or health care professional for regular checks on your progress. They will need to check the level of testosterone in your blood. This medicine is only approved for use in men who have low levels of testosterone related to certain medical conditions. Heart attacks and strokes have been reported with the use of this medicine. Notify your doctor or health care professional and seek emergency treatment if you develop breathing problems; changes in vision; confusion; chest pain or chest tightness; sudden arm pain; severe, sudden headache; trouble speaking or understanding; sudden numbness or weakness of the face, arm or leg; loss of balance or coordination. Talk to your doctor about the risks and benefits of this medicine. This medicine may affect blood sugar levels. If you have diabetes, check with your doctor or health care professional before you change your diet or the dose of your diabetic medicine. Testosterone injections are not commonly used in women. Women should inform their doctor if they wish to become pregnant or think they might be pregnant. There is a potential for serious side effects to an unborn child. Talk to your health care professional or pharmacist for more information. Talk with your doctor or health care professional about your birth control options while taking this medicine. This drug is banned from use in athletes by most athletic organizations. What side effects may I notice from receiving this medicine? Side effects that you should report to your doctor or health care professional as soon as possible: -allergic reactions like skin rash,   itching or hives, swelling of the face, lips, or tongue -breast enlargement -breathing problems -changes in emotions or moods -deep or  hoarse voice -irregular menstrual periods -signs and symptoms of liver injury like dark yellow or brown urine; general ill feeling or flu-like symptoms; light-colored stools; loss of appetite; nausea; right upper belly pain; unusually weak or tired; yellowing of the eyes or skin -stomach pain -swelling of the ankles, feet, hands -too frequent or persistent erections -trouble passing urine or change in the amount of urine Side effects that usually do not require medical attention (report to your doctor or health care professional if they continue or are bothersome): -acne -change in sex drive or performance -facial hair growth -hair loss -headache This list may not describe all possible side effects. Call your doctor for medical advice about side effects. You may report side effects to FDA at 1-800-FDA-1088. Where should I keep my medicine? Keep out of the reach of children. This medicine can be abused. Keep your medicine in a safe place to protect it from theft. Do not share this medicine with anyone. Selling or giving away this medicine is dangerous and against the law. Store at room temperature between 20 and 25 degrees C (68 and 77 degrees F). Do not freeze. Protect from light. Follow the directions for the product you are prescribed. Throw away any unused medicine after the expiration date. NOTE: This sheet is a summary. It may not cover all possible information. If you have questions about this medicine, talk to your doctor, pharmacist, or health care provider.  2017 [employer] Standard (2015-03-16 07:33:55)

## 2016-03-14 ENCOUNTER — Ambulatory Visit
Admission: RE | Admit: 2016-03-14 | Discharge: 2016-03-14 | Disposition: A | Discharge status: Home / Self Care | Payer: Medicare Other | Source: Ambulatory Visit | Attending: Neurology | Admitting: Neurology

## 2016-03-14 DIAGNOSIS — M62542 Muscle wasting and atrophy, not elsewhere classified, left hand: Secondary | ICD-10-CM

## 2016-03-14 DIAGNOSIS — G5139 Clonic hemifacial spasm, unspecified: Secondary | ICD-10-CM

## 2016-03-14 DIAGNOSIS — R292 Abnormal reflex: Secondary | ICD-10-CM

## 2016-03-14 DIAGNOSIS — R531 Weakness: Secondary | ICD-10-CM

## 2016-03-16 ENCOUNTER — Telehealth: Payer: Self-pay | Admitting: Neurology

## 2016-03-16 NOTE — Telephone Encounter (Signed)
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  This explains his left sided weakness and myelopathic exam findings.  Patient is agreeable to referral to neurosurgery for surgical evaluation.    Medardo Hassing K. Posey Pronto, DO

## 2016-03-16 NOTE — Telephone Encounter (Signed)
Referral sent 

## 2016-03-20 ENCOUNTER — Telehealth: Payer: Self-pay | Admitting: *Deleted

## 2016-03-20 NOTE — Telephone Encounter (Signed)
Received a call from Dr Leontine Locket office. They are requesting for Dr [name] to take over on pain management for this patient.   Spoke to Dr [name]. He declines taking responsibility for pain management as his pain is not from his cancer diagnosis. Notified Dr Leontine Locket office.

## 2016-03-23 ENCOUNTER — Ambulatory Visit (HOSPITAL_BASED_OUTPATIENT_CLINIC_OR_DEPARTMENT_OTHER): Payer: Medicare Other

## 2016-03-23 ENCOUNTER — Ambulatory Visit: Payer: Medicare Other

## 2016-03-23 ENCOUNTER — Other Ambulatory Visit (HOSPITAL_BASED_OUTPATIENT_CLINIC_OR_DEPARTMENT_OTHER): Payer: Medicare Other

## 2016-03-23 ENCOUNTER — Ambulatory Visit (HOSPITAL_BASED_OUTPATIENT_CLINIC_OR_DEPARTMENT_OTHER): Payer: Medicare Other | Admitting: Hematology & Oncology

## 2016-03-23 VITALS — BP 173/90 | HR 90 | Temp 98.5°F | Resp 16 | Wt 199.0 lb

## 2016-03-23 DIAGNOSIS — C9001 Multiple myeloma in remission: Secondary | ICD-10-CM

## 2016-03-23 DIAGNOSIS — E349 Endocrine disorder, unspecified: Secondary | ICD-10-CM

## 2016-03-23 DIAGNOSIS — E291 Testicular hypofunction: Secondary | ICD-10-CM

## 2016-03-23 DIAGNOSIS — R52 Pain, unspecified: Secondary | ICD-10-CM

## 2016-03-23 DIAGNOSIS — Z452 Encounter for adjustment and management of vascular access device: Secondary | ICD-10-CM

## 2016-03-23 DIAGNOSIS — C9000 Multiple myeloma not having achieved remission: Secondary | ICD-10-CM

## 2016-03-23 DIAGNOSIS — D249 Benign neoplasm of unspecified breast: Secondary | ICD-10-CM | POA: Diagnosis not present

## 2016-03-23 DIAGNOSIS — D509 Iron deficiency anemia, unspecified: Secondary | ICD-10-CM | POA: Diagnosis not present

## 2016-03-23 DIAGNOSIS — M2550 Pain in unspecified joint: Secondary | ICD-10-CM

## 2016-03-23 DIAGNOSIS — Z95828 Presence of other vascular implants and grafts: Secondary | ICD-10-CM

## 2016-03-23 DIAGNOSIS — N289 Disorder of kidney and ureter, unspecified: Secondary | ICD-10-CM | POA: Diagnosis not present

## 2016-03-23 LAB — CBC WITH DIFFERENTIAL (CANCER CENTER ONLY)
BASO#: 0.1 10*3/uL (ref 0.0–0.2)
BASO%: 1 % (ref 0.0–2.0)
EOS ABS: 0.2 10*3/uL (ref 0.0–0.5)
EOS%: 2.6 % (ref 0.0–7.0)
HEMATOCRIT: 38.8 % (ref 38.7–49.9)
HEMOGLOBIN: 13 g/dL (ref 13.0–17.1)
LYMPH#: 2.3 10*3/uL (ref 0.9–3.3)
LYMPH%: 29.8 % (ref 14.0–48.0)
MCH: 24.7 pg — ABNORMAL LOW (ref 28.0–33.4)
MCHC: 33.5 g/dL (ref 32.0–35.9)
MCV: 74 fL — AB (ref 82–98)
MONO#: 0.8 10*3/uL (ref 0.1–0.9)
MONO%: 10.1 % (ref 0.0–13.0)
NEUT%: 56.5 % (ref 40.0–80.0)
NEUTROS ABS: 4.4 10*3/uL (ref 1.5–6.5)
Platelets: 197 10*3/uL (ref 145–400)
RBC: 5.27 10*6/uL (ref 4.20–5.70)
RDW: 16.3 % — ABNORMAL HIGH (ref 11.1–15.7)
WBC: 7.7 10*3/uL (ref 4.0–10.0)

## 2016-03-23 LAB — CMP (CANCER CENTER ONLY)
ALBUMIN: 3.6 g/dL (ref 3.3–5.5)
ALK PHOS: 111 U/L — AB (ref 26–84)
ALT(SGPT): 24 U/L (ref 10–47)
AST: 24 U/L (ref 11–38)
BUN, Bld: 11 mg/dL (ref 7–22)
CALCIUM: 9 mg/dL (ref 8.0–10.3)
CO2: 26 meq/L (ref 18–33)
Chloride: 103 mEq/L (ref 98–108)
Creat: 1.9 mg/dl — ABNORMAL HIGH (ref 0.6–1.2)
Glucose, Bld: 217 mg/dL — ABNORMAL HIGH (ref 73–118)
Potassium: 3.5 mEq/L (ref 3.3–4.7)
Sodium: 137 mEq/L (ref 128–145)
Total Bilirubin: 0.6 mg/dl (ref 0.20–1.60)
Total Protein: 7.8 g/dL (ref 6.4–8.1)

## 2016-03-23 LAB — FERRITIN: Ferritin: 1291 ng/ml — ABNORMAL HIGH (ref 22–316)

## 2016-03-23 LAB — IRON AND TIBC
%SAT: 34 % (ref 20–55)
IRON: 71 ug/dL (ref 42–163)
TIBC: 209 ug/dL (ref 202–409)
UIBC: 138 ug/dL (ref 117–376)

## 2016-03-23 MED ORDER — MORPHINE SULFATE 4 MG/ML IJ SOLN
4.0000 mg | Freq: Once | INTRAMUSCULAR | Status: AC
  Administered 2016-03-23: 4 mg via INTRAVENOUS
  Filled 2016-03-23: qty 1

## 2016-03-23 MED ORDER — MORPHINE SULFATE (PF) 4 MG/ML IV SOLN
INTRAVENOUS | Status: AC
  Filled 2016-03-23: qty 1

## 2016-03-23 MED ORDER — OXYCODONE-ACETAMINOPHEN 10-325 MG PO TABS: 1.0000 | ORAL_TABLET | Freq: Four times a day (QID) | ORAL | 0 refills | Status: DC | PRN

## 2016-03-23 MED ORDER — OXYCODONE HCL ER 30 MG PO T12A: EXTENDED_RELEASE_TABLET | ORAL | 0 refills | Status: DC

## 2016-03-23 MED ORDER — TESTOSTERONE CYPIONATE 200 MG/ML IM SOLN
INTRAMUSCULAR | Status: AC
  Filled 2016-03-23: qty 2

## 2016-03-23 MED ORDER — HEPARIN SOD (PORK) LOCK FLUSH 100 UNIT/ML IV SOLN
500.0000 [IU] | Freq: Once | INTRAVENOUS | Status: AC
  Administered 2016-03-23: 500 [IU] via INTRAVENOUS
  Filled 2016-03-23: qty 5

## 2016-03-23 MED ORDER — SODIUM CHLORIDE 0.9% FLUSH
10.0000 mL | INTRAVENOUS | Status: DC | PRN
  Administered 2016-03-23: 10 mL via INTRAVENOUS
  Filled 2016-03-23: qty 10

## 2016-03-23 MED ORDER — TESTOSTERONE CYPIONATE 200 MG/ML IM SOLN
300.0000 mg | INTRAMUSCULAR | Status: DC
  Administered 2016-03-23: 300 mg via INTRAMUSCULAR

## 2016-03-23 NOTE — Addendum Note (Signed)
Addended by: Burney Gauze R on: 03/23/2016 09:12 AM   Modules accepted: Orders

## 2016-03-23 NOTE — Patient Instructions (Signed)
Testosterone injection What is this medicine? TESTOSTERONE (tes TOS ter one) is the main male hormone. It supports normal male development such as muscle growth, facial hair, and deep voice. It is used in males to treat low testosterone levels. This medicine may be used for other purposes; ask your health care provider or pharmacist if you have questions. COMMON BRAND NAME(S): Andro-L.A., Aveed, Delatestryl, Depo-Testosterone, Virilon What should I tell my health care provider before I take this medicine? They need to know if you have any of these conditions: -cancer -diabetes -heart disease -kidney disease -liver disease -lung disease -prostate disease -an unusual or allergic reaction to testosterone, other medicines, foods, dyes, or preservatives -pregnant or trying to get pregnant -breast-feeding How should I use this medicine? This medicine is for injection into a muscle. It is usually given by a health care professional in a hospital or clinic setting. Contact your pediatrician regarding the use of this medicine in children. While this medicine may be prescribed for children as young as 12 years of age for selected conditions, precautions do apply. Overdosage: If you think you have taken too much of this medicine contact a poison control center or emergency room at once. NOTE: This medicine is only for you. Do not share this medicine with others. What if I miss a dose? Try not to miss a dose. Your doctor or health care professional will tell you when your next injection is due. Notify the office if you are unable to keep an appointment. What may interact with this medicine? -medicines for diabetes -medicines that treat or prevent blood clots like warfarin -oxyphenbutazone -propranolol -steroid medicines like prednisone or cortisone This list may not describe all possible interactions. Give your health care provider a list of all the medicines, herbs, non-prescription drugs, or  dietary supplements you use. Also tell them if you smoke, drink alcohol, or use illegal drugs. Some items may interact with your medicine. What should I watch for while using this medicine? Visit your doctor or health care professional for regular checks on your progress. They will need to check the level of testosterone in your blood. This medicine is only approved for use in men who have low levels of testosterone related to certain medical conditions. Heart attacks and strokes have been reported with the use of this medicine. Notify your doctor or health care professional and seek emergency treatment if you develop breathing problems; changes in vision; confusion; chest pain or chest tightness; sudden arm pain; severe, sudden headache; trouble speaking or understanding; sudden numbness or weakness of the face, arm or leg; loss of balance or coordination. Talk to your doctor about the risks and benefits of this medicine. This medicine may affect blood sugar levels. If you have diabetes, check with your doctor or health care professional before you change your diet or the dose of your diabetic medicine. Testosterone injections are not commonly used in women. Women should inform their doctor if they wish to become pregnant or think they might be pregnant. There is a potential for serious side effects to an unborn child. Talk to your health care professional or pharmacist for more information. Talk with your doctor or health care professional about your birth control options while taking this medicine. This drug is banned from use in athletes by most athletic organizations. What side effects may I notice from receiving this medicine? Side effects that you should report to your doctor or health care professional as soon as possible: -allergic reactions like skin rash,   itching or hives, swelling of the face, lips, or tongue -breast enlargement -breathing problems -changes in emotions or moods -deep or  hoarse voice -irregular menstrual periods -signs and symptoms of liver injury like dark yellow or brown urine; general ill feeling or flu-like symptoms; light-colored stools; loss of appetite; nausea; right upper belly pain; unusually weak or tired; yellowing of the eyes or skin -stomach pain -swelling of the ankles, feet, hands -too frequent or persistent erections -trouble passing urine or change in the amount of urine Side effects that usually do not require medical attention (report to your doctor or health care professional if they continue or are bothersome): -acne -change in sex drive or performance -facial hair growth -hair loss -headache This list may not describe all possible side effects. Call your doctor for medical advice about side effects. You may report side effects to FDA at 1-800-FDA-1088. Where should I keep my medicine? Keep out of the reach of children. This medicine can be abused. Keep your medicine in a safe place to protect it from theft. Do not share this medicine with anyone. Selling or giving away this medicine is dangerous and against the law. Store at room temperature between 20 and 25 degrees C (68 and 77 degrees F). Do not freeze. Protect from light. Follow the directions for the product you are prescribed. Throw away any unused medicine after the expiration date. NOTE: This sheet is a summary. It may not cover all possible information. If you have questions about this medicine, talk to your doctor, pharmacist, or health care provider.  2017 [employer] Standard (2015-03-16 07:33:55)

## 2016-03-23 NOTE — Progress Notes (Signed)
Hematology and Oncology Follow Up Visit  [name] [id number] 08/17/1950 66 y.o. 03/23/2016   Principle Diagnosis:  1. IgG kappa myeloma. 2. Anemia secondary to renal insufficiency. 3. Intermittent iron-deficiency anemia. 4. Hypotestosteronemia  Current Therapy:   1. Aredia 60 mg IV q.2 months  2. Aranesp 300 mcg subcu as needed for hemoglobin less than 11. 3. Depo-Testosterone 300 mg q.2 weeks. 4. IV iron as indicated.     Interim History:  [name] is back for followup.  He is having some pain issues.  His pain medication a few weeks ago. His regular doctor is out of the country right now at a family funeral. [name] clearly has a worsening bone issue. He has a lot of bony pain. He does have myeloma but this has not been a problem for him. His last M spike was 1 g/dL. His IgG level was 2054 milligrams per deciliter. His Kappa Lightchain was 3.5 mg/dL. All this is holding pretty steady.   His last testosterone level was 826. This is holding pretty steady.   There's not been any problems with fever. He's had no bleeding. His blood sugars have been doing pretty well.   He does get his Aredia. This has helped.   Overall, his performance status is ECOG 1.   Medications:  Current Outpatient Prescriptions:  .  amLODipine (NORVASC) 10 MG tablet, Take 10 mg by mouth daily., Disp: , Rfl:  .  CARAFATE 1 GM/10ML suspension, Take 1 g by mouth 4 (four) times daily. , Disp: , Rfl:  .  diazepam (VALIUM) 5 MG tablet, Take 5 mg by mouth every 4 (four) hours as needed for anxiety., Disp: , Rfl:  .  dicyclomine (BENTYL) 10 MG capsule, Take 1 capsule (10 mg total) by mouth 3 (three) times daily., Disp: 90 capsule, Rfl: 2 .  furosemide (LASIX) 40 MG tablet, Take 40 mg by mouth., Disp: , Rfl:  .  glipiZIDE (GLUCOTROL) 5 MG tablet, Take 5 mg by mouth daily before breakfast., Disp: , Rfl:  .  Hypromellose (NATURAL BALANCE TEARS OP), Apply 1 drop to eye every morning. , Disp: , Rfl:  .   Linaclotide (LINZESS) 145 MCG CAPS capsule, Take 1 capsule (145 mcg total) by mouth daily before breakfast., Disp: 30 capsule, Rfl: 2 .  meclizine (ANTIVERT) 50 MG tablet, Take 0.5 tablets (25 mg total) by mouth 4 (four) times daily as needed for dizziness., Disp: 30 tablet, Rfl: 0 .  meloxicam (MOBIC) 15 MG tablet, Take 15 mg by mouth., Disp: , Rfl:  .  mesalamine (LIALDA) 1.2 g EC tablet, Take 1,200 mg by mouth., Disp: , Rfl:  .  methocarbamol (ROBAXIN) 500 MG tablet, Take 500 mg by mouth 2 (two) times daily as needed for muscle spasms. , Disp: , Rfl:  .  metoprolol succinate (TOPROL XL) 50 MG 24 hr tablet, Take 50 mg by mouth., Disp: , Rfl:  .  omeprazole (PRILOSEC) 40 MG capsule, Take 1 capsule (40 mg total) by mouth 2 (two) times daily., Disp: 60 capsule, Rfl: 5 .  ondansetron (ZOFRAN ODT) 4 MG disintegrating tablet, Take 1 tablet (4 mg total) by mouth every 8 (eight) hours as needed for nausea or vomiting., Disp: 10 tablet, Rfl: 0 .  oxyCODONE-acetaminophen (PERCOCET) 10-325 MG tablet, Take 1 tablet by mouth every 6 (six) hours as needed for pain., Disp: 30 tablet, Rfl: 0 .  Polyvinyl Alcohol-Povidone (CLEAR EYES ALL SEASONS) 5-6 MG/ML SOLN, Apply 2 drops to eye 3 (three)  times daily as needed (drye eyes.). , Disp: , Rfl:  .  potassium chloride SA (K-DUR,KLOR-CON) 20 MEQ tablet, Take 1 tablet (20 mEq total) by mouth 2 (two) times daily., Disp: 60 tablet, Rfl: 6 .  prochlorperazine (COMPAZINE) 10 MG tablet, Take 1 tablet (10 mg total) by mouth every 6 (six) hours as needed for nausea or vomiting., Disp: 60 tablet, Rfl: 2 .  Sennosides (LAXATIVE PILLS PO), Take 1 tablet by mouth daily as needed (constipation)., Disp: , Rfl:  .  sildenafil (VIAGRA) 100 MG tablet, Take 100 mg by mouth., Disp: , Rfl:  .  Tamsulosin HCl (FLOMAX) 0.4 MG CAPS, Take 0.4 mg by mouth daily. , Disp: , Rfl:  .  triamterene-hydrochlorothiazide (MAXZIDE-25) 37.5-25 MG per tablet, Take 1 tablet by mouth daily.  , Disp: , Rfl:    .  Vitamin D, Ergocalciferol, (DRISDOL) 50000 UNITS CAPS capsule, Take 50,000 Units by mouth every 7 (seven) days., Disp: , Rfl:  .  oxyCODONE 30 MG 12 hr tablet, Take 1 pill every 8 hours, Disp: 42 tablet, Rfl: 0 No current facility-administered medications for this visit.   Facility-Administered Medications Ordered in Other Visits:  .  morphine 4 MG/ML injection 4 mg, 4 mg, Intravenous, Q4H PRN, Volanda Napoleon, MD  Allergies:  Allergies  Allergen Reactions  . Iodine Anxiety and Rash    Didn't feel right "instructed not to take per MD--something with his port"    Past Medical History, Surgical history, Social history, and Family History were reviewed and updated.  Review of Systems: As above  Physical Exam:  weight is 199 lb (90.3 kg). His oral temperature is 98.5 F (36.9 C). His blood pressure is 173/90 (abnormal) and his pulse is 90. His respiration is 16 and oxygen saturation is 100%.   His head and neck exam shows no ocular or oral lesions. He has no adenopathy in the neck. His thyroid is non-palpable. Lungs are clear. Cardiac exam regular rate and rhythm with no murmurs, rubs or bruits.. Abdomen is soft. His abdomen is slightly distended. He has good bowel sounds. There is no fluid wave. There is no palpable liver or spleen tip. Back exam shows no tenderness in the lumbar spine. There is no paravertebral muscle spasms. . He has slightly decreased range of motion of the lower back. Extremities shows no clubbing cyanosis or edema.He does have a swelling in the posterior right shoulder. There is some tenderness in this area to palpation. He has fairly decent range of motion of the right arm. He has decent strength in his legs. Skin exam shows no rashes. Neurological exam is nonfocal.  Lab Results  Component Value Date   WBC 7.7 03/23/2016   HGB 13.0 03/23/2016   HCT 38.8 03/23/2016   MCV 74 (L) 03/23/2016   PLT 197 03/23/2016     Chemistry      Component Value Date/Time    NA 140 02/10/2016 0738   K 3.3 02/10/2016 0738   CL 100 02/10/2016 0738   CO2 27 02/10/2016 0738   BUN 19 02/10/2016 0738   CREATININE 2.5 (H) 02/10/2016 0738      Component Value Date/Time   CALCIUM 9.4 02/10/2016 0738   ALKPHOS 82 02/10/2016 0738   AST 27 02/10/2016 0738   ALT 21 02/10/2016 0738   BILITOT 0.60 02/10/2016 0738         Impression and Plan: Mr. Gongora is a 66 year old gentleman. He has multiple medical problems. I still do not think  that myeloma is an issue. With his monoclonal spike holding  steady, I still do not think there is any indication that his myeloma needs to be treated.  Overall, I think his quality of life isSuffering a little bit. He ran out of his pain medication a few weeks ago. Apparently, his regular doctor is out of the country for a death in the family. He is supposed be back in a couple weeks. As such, I gave Mr. Eastman is a 2 week supply of pain medication until he back to his regular doctor. He knows that this is being done out of courtesy to him since we have known him for a long time. I just do not want to see him suffer an struggle with all of his arthralgias.  For now, I think that we can hold off on any Aranesp or iron. I do not think that he has had iron for 3-4 years.  As always, we will get him back to see me in 2 more months. He'll get his Aredia today.  Volanda Napoleon, MD 1/29/20189:02 AM

## 2016-03-24 LAB — KAPPA/LAMBDA LIGHT CHAINS
IG LAMBDA FREE LIGHT CHAIN: 22.9 mg/L (ref 5.7–26.3)
Ig Kappa Free Light Chain: 32.2 mg/L — ABNORMAL HIGH (ref 3.3–19.4)
KAPPA/LAMBDA FLC RATIO: 1.41 (ref 0.26–1.65)

## 2016-03-24 LAB — IGG, IGA, IGM
IGA/IMMUNOGLOBULIN A, SERUM: 305 mg/dL (ref 61–437)
IGM (IMMUNOGLOBIN M), SRM: 65 mg/dL (ref 20–172)
IgG, Qn, Serum: 1715 mg/dL — ABNORMAL HIGH (ref 700–1600)

## 2016-03-24 LAB — RETICULOCYTES: Reticulocyte Count: 1 % (ref 0.6–2.6)

## 2016-03-26 LAB — PROTEIN ELECTROPHORESIS, SERUM, WITH REFLEX
A/G Ratio: 0.9 (ref 0.7–1.7)
ALBUMIN: 3.4 g/dL (ref 2.9–4.4)
ALPHA 1: 0.2 g/dL (ref 0.0–0.4)
ALPHA 2: 1 g/dL (ref 0.4–1.0)
Beta: 1 g/dL (ref 0.7–1.3)
GAMMA GLOBULIN: 1.7 g/dL (ref 0.4–1.8)
Globulin, Total: 3.9 g/dL (ref 2.2–3.9)
Interpretation(See Below): 0
M-Spike, %: 1 g/dL — ABNORMAL HIGH
TOTAL PROTEIN: 7.3 g/dL (ref 6.0–8.5)

## 2016-03-30 ENCOUNTER — Other Ambulatory Visit: Payer: Self-pay | Admitting: Hematology & Oncology

## 2016-04-06 ENCOUNTER — Other Ambulatory Visit: Payer: Medicare Other

## 2016-04-06 ENCOUNTER — Ambulatory Visit: Payer: Medicare Other

## 2016-04-06 ENCOUNTER — Ambulatory Visit (HOSPITAL_BASED_OUTPATIENT_CLINIC_OR_DEPARTMENT_OTHER): Payer: Medicare Other

## 2016-04-06 ENCOUNTER — Other Ambulatory Visit: Payer: Self-pay

## 2016-04-06 VITALS — BP 178/79 | Temp 98.1°F | Resp 16

## 2016-04-06 DIAGNOSIS — E349 Endocrine disorder, unspecified: Secondary | ICD-10-CM

## 2016-04-06 DIAGNOSIS — C9001 Multiple myeloma in remission: Secondary | ICD-10-CM

## 2016-04-06 DIAGNOSIS — E291 Testicular hypofunction: Secondary | ICD-10-CM | POA: Diagnosis present

## 2016-04-06 MED ORDER — TESTOSTERONE CYPIONATE 200 MG/ML IM SOLN
300.0000 mg | INTRAMUSCULAR | Status: DC
  Administered 2016-04-06: 300 mg via INTRAMUSCULAR

## 2016-04-06 MED ORDER — TESTOSTERONE CYPIONATE 200 MG/ML IM SOLN
INTRAMUSCULAR | Status: AC
  Filled 2016-04-06: qty 2

## 2016-04-06 NOTE — Patient Instructions (Signed)
Testosterone injection What is this medicine? TESTOSTERONE (tes TOS ter one) is the main male hormone. It supports normal male development such as muscle growth, facial hair, and deep voice. It is used in males to treat low testosterone levels. This medicine may be used for other purposes; ask your health care provider or pharmacist if you have questions. COMMON BRAND NAME(S): Andro-L.A., Aveed, Delatestryl, Depo-Testosterone, Virilon What should I tell my health care provider before I take this medicine? They need to know if you have any of these conditions: -cancer -diabetes -heart disease -kidney disease -liver disease -lung disease -prostate disease -an unusual or allergic reaction to testosterone, other medicines, foods, dyes, or preservatives -pregnant or trying to get pregnant -breast-feeding How should I use this medicine? This medicine is for injection into a muscle. It is usually given by a health care professional in a hospital or clinic setting. Contact your pediatrician regarding the use of this medicine in children. While this medicine may be prescribed for children as young as 12 years of age for selected conditions, precautions do apply. Overdosage: If you think you have taken too much of this medicine contact a poison control center or emergency room at once. NOTE: This medicine is only for you. Do not share this medicine with others. What if I miss a dose? Try not to miss a dose. Your doctor or health care professional will tell you when your next injection is due. Notify the office if you are unable to keep an appointment. What may interact with this medicine? -medicines for diabetes -medicines that treat or prevent blood clots like warfarin -oxyphenbutazone -propranolol -steroid medicines like prednisone or cortisone This list may not describe all possible interactions. Give your health care provider a list of all the medicines, herbs, non-prescription drugs, or  dietary supplements you use. Also tell them if you smoke, drink alcohol, or use illegal drugs. Some items may interact with your medicine. What should I watch for while using this medicine? Visit your doctor or health care professional for regular checks on your progress. They will need to check the level of testosterone in your blood. This medicine is only approved for use in men who have low levels of testosterone related to certain medical conditions. Heart attacks and strokes have been reported with the use of this medicine. Notify your doctor or health care professional and seek emergency treatment if you develop breathing problems; changes in vision; confusion; chest pain or chest tightness; sudden arm pain; severe, sudden headache; trouble speaking or understanding; sudden numbness or weakness of the face, arm or leg; loss of balance or coordination. Talk to your doctor about the risks and benefits of this medicine. This medicine may affect blood sugar levels. If you have diabetes, check with your doctor or health care professional before you change your diet or the dose of your diabetic medicine. Testosterone injections are not commonly used in women. Women should inform their doctor if they wish to become pregnant or think they might be pregnant. There is a potential for serious side effects to an unborn child. Talk to your health care professional or pharmacist for more information. Talk with your doctor or health care professional about your birth control options while taking this medicine. This drug is banned from use in athletes by most athletic organizations. What side effects may I notice from receiving this medicine? Side effects that you should report to your doctor or health care professional as soon as possible: -allergic reactions like skin rash,   itching or hives, swelling of the face, lips, or tongue -breast enlargement -breathing problems -changes in emotions or moods -deep or  hoarse voice -irregular menstrual periods -signs and symptoms of liver injury like dark yellow or brown urine; general ill feeling or flu-like symptoms; light-colored stools; loss of appetite; nausea; right upper belly pain; unusually weak or tired; yellowing of the eyes or skin -stomach pain -swelling of the ankles, feet, hands -too frequent or persistent erections -trouble passing urine or change in the amount of urine Side effects that usually do not require medical attention (report to your doctor or health care professional if they continue or are bothersome): -acne -change in sex drive or performance -facial hair growth -hair loss -headache This list may not describe all possible side effects. Call your doctor for medical advice about side effects. You may report side effects to FDA at 1-800-FDA-1088. Where should I keep my medicine? Keep out of the reach of children. This medicine can be abused. Keep your medicine in a safe place to protect it from theft. Do not share this medicine with anyone. Selling or giving away this medicine is dangerous and against the law. Store at room temperature between 20 and 25 degrees C (68 and 77 degrees F). Do not freeze. Protect from light. Follow the directions for the product you are prescribed. Throw away any unused medicine after the expiration date. NOTE: This sheet is a summary. It may not cover all possible information. If you have questions about this medicine, talk to your doctor, pharmacist, or health care provider.  2017 [employer] Standard (2015-03-16 07:33:55)

## 2016-04-13 ENCOUNTER — Ambulatory Visit (HOSPITAL_BASED_OUTPATIENT_CLINIC_OR_DEPARTMENT_OTHER): Payer: Medicare Other

## 2016-04-13 ENCOUNTER — Other Ambulatory Visit: Payer: Self-pay

## 2016-04-13 ENCOUNTER — Ambulatory Visit: Payer: Medicare Other

## 2016-04-13 ENCOUNTER — Other Ambulatory Visit (HOSPITAL_BASED_OUTPATIENT_CLINIC_OR_DEPARTMENT_OTHER): Payer: Medicare Other

## 2016-04-13 ENCOUNTER — Other Ambulatory Visit (HOSPITAL_BASED_OUTPATIENT_CLINIC_OR_DEPARTMENT_OTHER): Payer: Medicare Other | Admitting: *Deleted

## 2016-04-13 DIAGNOSIS — N189 Chronic kidney disease, unspecified: Secondary | ICD-10-CM

## 2016-04-13 DIAGNOSIS — D509 Iron deficiency anemia, unspecified: Secondary | ICD-10-CM

## 2016-04-13 DIAGNOSIS — C9000 Multiple myeloma not having achieved remission: Secondary | ICD-10-CM

## 2016-04-13 DIAGNOSIS — C9001 Multiple myeloma in remission: Secondary | ICD-10-CM

## 2016-04-13 DIAGNOSIS — Z299 Encounter for prophylactic measures, unspecified: Secondary | ICD-10-CM

## 2016-04-13 DIAGNOSIS — D631 Anemia in chronic kidney disease: Secondary | ICD-10-CM

## 2016-04-13 LAB — CBC WITH DIFFERENTIAL (CANCER CENTER ONLY)
BASO#: 0.1 10*3/uL (ref 0.0–0.2)
BASO%: 0.5 % (ref 0.0–2.0)
EOS%: 2.1 % (ref 0.0–7.0)
Eosinophils Absolute: 0.2 10*3/uL (ref 0.0–0.5)
HEMATOCRIT: 39.6 % (ref 38.7–49.9)
HGB: 13.5 g/dL (ref 13.0–17.1)
LYMPH#: 1.4 10*3/uL (ref 0.9–3.3)
LYMPH%: 13.1 % — ABNORMAL LOW (ref 14.0–48.0)
MCH: 24.9 pg — ABNORMAL LOW (ref 28.0–33.4)
MCHC: 34.1 g/dL (ref 32.0–35.9)
MCV: 73 fL — ABNORMAL LOW (ref 82–98)
MONO#: 1 10*3/uL — ABNORMAL HIGH (ref 0.1–0.9)
MONO%: 9.1 % (ref 0.0–13.0)
NEUT%: 75.2 % (ref 40.0–80.0)
NEUTROS ABS: 8 10*3/uL — AB (ref 1.5–6.5)
Platelets: 209 10*3/uL (ref 145–400)
RBC: 5.42 10*6/uL (ref 4.20–5.70)
RDW: 16.7 % — ABNORMAL HIGH (ref 11.1–15.7)
WBC: 10.7 10*3/uL — AB (ref 4.0–10.0)

## 2016-04-13 LAB — CMP (CANCER CENTER ONLY)
ALK PHOS: 102 U/L — AB (ref 26–84)
ALT: 20 U/L (ref 10–47)
AST: 24 U/L (ref 11–38)
Albumin: 3.6 g/dL (ref 3.3–5.5)
BILIRUBIN TOTAL: 0.5 mg/dL (ref 0.20–1.60)
BUN, Bld: 29 mg/dL — ABNORMAL HIGH (ref 7–22)
CALCIUM: 9.1 mg/dL (ref 8.0–10.3)
CO2: 27 meq/L (ref 18–33)
CREATININE: 2.4 mg/dL — AB (ref 0.6–1.2)
Chloride: 101 mEq/L (ref 98–108)
Glucose, Bld: 153 mg/dL — ABNORMAL HIGH (ref 73–118)
Potassium: 3.3 mEq/L (ref 3.3–4.7)
SODIUM: 142 meq/L (ref 128–145)
Total Protein: 7.9 g/dL (ref 6.4–8.1)

## 2016-04-13 MED ORDER — HYDROMORPHONE HCL 4 MG/ML IJ SOLN
INTRAMUSCULAR | Status: AC
  Filled 2016-04-13: qty 1

## 2016-04-13 MED ORDER — HYDROMORPHONE HCL 1 MG/ML IJ SOLN
2.0000 mg | Freq: Once | INTRAMUSCULAR | Status: AC
  Administered 2016-04-13: 2 mg via INTRAVENOUS

## 2016-04-13 MED ORDER — SODIUM CHLORIDE 0.9 % IV SOLN
60.0000 mg | Freq: Once | INTRAVENOUS | Status: AC
  Administered 2016-04-13: 60 mg via INTRAVENOUS
  Filled 2016-04-13: qty 10

## 2016-04-13 NOTE — Patient Instructions (Signed)
Pamidronate injection What is this medicine? PAMIDRONATE (pa mi DROE nate) slows calcium loss from bones. It is used to treat high calcium blood levels from cancer or Paget's disease. It is also used to treat bone pain and prevent fractures from certain cancers that have spread to the bone. This medicine may be used for other purposes; ask your health care provider or pharmacist if you have questions. COMMON BRAND NAME(S): Aredia What should I tell my health care provider before I take this medicine? They need to know if you have any of these conditions: -aspirin-sensitive asthma -dental disease -kidney disease -an unusual or allergic reaction to pamidronate, other medicines, foods, dyes, or preservatives -pregnant or trying to get pregnant -breast-feeding How should I use this medicine? This medicine is for infusion into a vein. It is given by a health care professional in a hospital or clinic setting. Talk to your pediatrician regarding the use of this medicine in children. This medicine is not approved for use in children. Overdosage: If you think you have taken too much of this medicine contact a poison control center or emergency room at once. NOTE: This medicine is only for you. Do not share this medicine with others. What if I miss a dose? This does not apply. What may interact with this medicine? -certain antibiotics given by injection -medicines for inflammation or pain like ibuprofen, naproxen -some diuretics like bumetanide, furosemide -cyclosporine -parathyroid hormone -tacrolimus -teriparatide -thalidomide This list may not describe all possible interactions. Give your health care provider a list of all the medicines, herbs, non-prescription drugs, or dietary supplements you use. Also tell them if you smoke, drink alcohol, or use illegal drugs. Some items may interact with your medicine. What should I watch for while using this medicine? Visit your doctor or health care  professional for regular checkups. It may be some time before you see the benefit from this medicine. Do not stop taking your medicine unless your doctor tells you to. Your doctor may order blood tests or other tests to see how you are doing. Women should inform their doctor if they wish to become pregnant or think they might be pregnant. There is a potential for serious side effects to an unborn child. Talk to your health care professional or pharmacist for more information. You should make sure that you get enough calcium and vitamin D while you are taking this medicine. Discuss the foods you eat and the vitamins you take with your health care professional. Some people who take this medicine have severe bone, joint, and/or muscle pain. This medicine may also increase your risk for a broken thigh bone. Tell your doctor right away if you have pain in your upper leg or groin. Tell your doctor if you have any pain that does not go away or that gets worse. What side effects may I notice from receiving this medicine? Side effects that you should report to your doctor or health care professional as soon as possible: -allergic reactions like skin rash, itching or hives, swelling of the face, lips, or tongue -black or tarry stools -changes in vision -eye inflammation, pain -high blood pressure -jaw pain, especially burning or cramping -muscle weakness -numb, tingling pain -swelling of feet or hands -trouble passing urine or change in the amount of urine -unable to move easily Side effects that usually do not require medical attention (report to your doctor or health care professional if they continue or are bothersome): -bone, joint, or muscle pain -constipation -dizzy, drowsy -  fever -headache -loss of appetite -nausea, vomiting -pain at site where injected This list may not describe all possible side effects. Call your doctor for medical advice about side effects. You may report side effects to  FDA at 1-800-FDA-1088. Where should I keep my medicine? This drug is given in a hospital or clinic and will not be stored at home. NOTE: This sheet is a summary. It may not cover all possible information. If you have questions about this medicine, talk to your doctor, pharmacist, or health care provider.  2017 [employer]vier/[employer] Standard (2010-08-08 08:49:49)

## 2016-04-13 NOTE — Patient Instructions (Signed)

## 2016-04-14 LAB — IRON AND TIBC
%SAT: 25 % (ref 20–55)
Iron: 53 ug/dL (ref 42–163)
TIBC: 210 ug/dL (ref 202–409)
UIBC: 157 ug/dL (ref 117–376)

## 2016-04-14 LAB — LACTATE DEHYDROGENASE: LDH: 210 U/L (ref 125–245)

## 2016-04-14 LAB — FERRITIN: Ferritin: 977 ng/ml — ABNORMAL HIGH (ref 22–316)

## 2016-04-14 LAB — RETICULOCYTES: RETICULOCYTE COUNT: 1.1 % (ref 0.6–2.6)

## 2016-04-14 LAB — TESTOSTERONE: Testosterone, Serum: 1469 ng/dL — ABNORMAL HIGH (ref 264–916)

## 2016-04-20 ENCOUNTER — Ambulatory Visit (HOSPITAL_BASED_OUTPATIENT_CLINIC_OR_DEPARTMENT_OTHER): Payer: Medicare Other

## 2016-04-20 VITALS — BP 161/85 | HR 20 | Temp 98.5°F

## 2016-04-20 DIAGNOSIS — E291 Testicular hypofunction: Secondary | ICD-10-CM

## 2016-04-20 DIAGNOSIS — E349 Endocrine disorder, unspecified: Secondary | ICD-10-CM

## 2016-04-20 MED ORDER — TESTOSTERONE CYPIONATE 200 MG/ML IM SOLN
300.0000 mg | INTRAMUSCULAR | Status: DC
  Administered 2016-04-20: 300 mg via INTRAMUSCULAR

## 2016-04-20 MED ORDER — TESTOSTERONE CYPIONATE 200 MG/ML IM SOLN
INTRAMUSCULAR | Status: AC
  Filled 2016-04-20: qty 2

## 2016-04-20 NOTE — Patient Instructions (Signed)
Testosterone injection What is this medicine? TESTOSTERONE (tes TOS ter one) is the main male hormone. It supports normal male development such as muscle growth, facial hair, and deep voice. It is used in males to treat low testosterone levels. This medicine may be used for other purposes; ask your health care provider or pharmacist if you have questions. COMMON BRAND NAME(S): Andro-L.A., Aveed, Delatestryl, Depo-Testosterone, Virilon What should I tell my health care provider before I take this medicine? They need to know if you have any of these conditions: -cancer -diabetes -heart disease -kidney disease -liver disease -lung disease -prostate disease -an unusual or allergic reaction to testosterone, other medicines, foods, dyes, or preservatives -pregnant or trying to get pregnant -breast-feeding How should I use this medicine? This medicine is for injection into a muscle. It is usually given by a health care professional in a hospital or clinic setting. Contact your pediatrician regarding the use of this medicine in children. While this medicine may be prescribed for children as young as 12 years of age for selected conditions, precautions do apply. Overdosage: If you think you have taken too much of this medicine contact a poison control center or emergency room at once. NOTE: This medicine is only for you. Do not share this medicine with others. What if I miss a dose? Try not to miss a dose. Your doctor or health care professional will tell you when your next injection is due. Notify the office if you are unable to keep an appointment. What may interact with this medicine? -medicines for diabetes -medicines that treat or prevent blood clots like warfarin -oxyphenbutazone -propranolol -steroid medicines like prednisone or cortisone This list may not describe all possible interactions. Give your health care provider a list of all the medicines, herbs, non-prescription drugs, or  dietary supplements you use. Also tell them if you smoke, drink alcohol, or use illegal drugs. Some items may interact with your medicine. What should I watch for while using this medicine? Visit your doctor or health care professional for regular checks on your progress. They will need to check the level of testosterone in your blood. This medicine is only approved for use in men who have low levels of testosterone related to certain medical conditions. Heart attacks and strokes have been reported with the use of this medicine. Notify your doctor or health care professional and seek emergency treatment if you develop breathing problems; changes in vision; confusion; chest pain or chest tightness; sudden arm pain; severe, sudden headache; trouble speaking or understanding; sudden numbness or weakness of the face, arm or leg; loss of balance or coordination. Talk to your doctor about the risks and benefits of this medicine. This medicine may affect blood sugar levels. If you have diabetes, check with your doctor or health care professional before you change your diet or the dose of your diabetic medicine. Testosterone injections are not commonly used in women. Women should inform their doctor if they wish to become pregnant or think they might be pregnant. There is a potential for serious side effects to an unborn child. Talk to your health care professional or pharmacist for more information. Talk with your doctor or health care professional about your birth control options while taking this medicine. This drug is banned from use in athletes by most athletic organizations. What side effects may I notice from receiving this medicine? Side effects that you should report to your doctor or health care professional as soon as possible: -allergic reactions like skin rash,   itching or hives, swelling of the face, lips, or tongue -breast enlargement -breathing problems -changes in emotions or moods -deep or  hoarse voice -irregular menstrual periods -signs and symptoms of liver injury like dark yellow or brown urine; general ill feeling or flu-like symptoms; light-colored stools; loss of appetite; nausea; right upper belly pain; unusually weak or tired; yellowing of the eyes or skin -stomach pain -swelling of the ankles, feet, hands -too frequent or persistent erections -trouble passing urine or change in the amount of urine Side effects that usually do not require medical attention (report to your doctor or health care professional if they continue or are bothersome): -acne -change in sex drive or performance -facial hair growth -hair loss -headache This list may not describe all possible side effects. Call your doctor for medical advice about side effects. You may report side effects to FDA at 1-800-FDA-1088. Where should I keep my medicine? Keep out of the reach of children. This medicine can be abused. Keep your medicine in a safe place to protect it from theft. Do not share this medicine with anyone. Selling or giving away this medicine is dangerous and against the law. Store at room temperature between 20 and 25 degrees C (68 and 77 degrees F). Do not freeze. Protect from light. Follow the directions for the product you are prescribed. Throw away any unused medicine after the expiration date. NOTE: This sheet is a summary. It may not cover all possible information. If you have questions about this medicine, talk to your doctor, pharmacist, or health care provider.  2017 [employer] Standard (2015-03-16 07:33:55)

## 2016-04-22 ENCOUNTER — Other Ambulatory Visit: Payer: Self-pay | Admitting: Neurosurgery

## 2016-05-04 ENCOUNTER — Ambulatory Visit (HOSPITAL_BASED_OUTPATIENT_CLINIC_OR_DEPARTMENT_OTHER): Payer: Medicare Other

## 2016-05-04 VITALS — BP 153/80 | HR 18 | Temp 98.0°F

## 2016-05-04 DIAGNOSIS — E291 Testicular hypofunction: Secondary | ICD-10-CM

## 2016-05-04 DIAGNOSIS — E349 Endocrine disorder, unspecified: Secondary | ICD-10-CM

## 2016-05-04 MED ORDER — TESTOSTERONE CYPIONATE 200 MG/ML IM SOLN
300.0000 mg | INTRAMUSCULAR | Status: DC
  Administered 2016-05-04: 300 mg via INTRAMUSCULAR

## 2016-05-04 MED ORDER — TESTOSTERONE CYPIONATE 200 MG/ML IM SOLN
INTRAMUSCULAR | Status: AC
  Filled 2016-05-04: qty 2

## 2016-05-12 ENCOUNTER — Encounter (INDEPENDENT_AMBULATORY_CARE_PROVIDER_SITE_OTHER): Payer: Medicare Other | Admitting: Podiatry

## 2016-05-12 NOTE — Progress Notes (Signed)
This encounter was created in error - please disregard.

## 2016-05-18 ENCOUNTER — Ambulatory Visit (HOSPITAL_BASED_OUTPATIENT_CLINIC_OR_DEPARTMENT_OTHER): Payer: Medicare Other

## 2016-05-18 ENCOUNTER — Other Ambulatory Visit (HOSPITAL_BASED_OUTPATIENT_CLINIC_OR_DEPARTMENT_OTHER): Payer: Medicare Other

## 2016-05-18 ENCOUNTER — Ambulatory Visit (HOSPITAL_BASED_OUTPATIENT_CLINIC_OR_DEPARTMENT_OTHER): Payer: Medicare Other | Admitting: Hematology & Oncology

## 2016-05-18 ENCOUNTER — Other Ambulatory Visit: Payer: Self-pay | Admitting: Family

## 2016-05-18 VITALS — BP 168/83 | HR 97 | Temp 98.2°F | Resp 17 | Wt 205.1 lb

## 2016-05-18 DIAGNOSIS — N189 Chronic kidney disease, unspecified: Secondary | ICD-10-CM

## 2016-05-18 DIAGNOSIS — C9001 Multiple myeloma in remission: Secondary | ICD-10-CM

## 2016-05-18 DIAGNOSIS — C9000 Multiple myeloma not having achieved remission: Secondary | ICD-10-CM | POA: Diagnosis present

## 2016-05-18 DIAGNOSIS — D508 Other iron deficiency anemias: Secondary | ICD-10-CM

## 2016-05-18 DIAGNOSIS — Z299 Encounter for prophylactic measures, unspecified: Secondary | ICD-10-CM

## 2016-05-18 DIAGNOSIS — M2550 Pain in unspecified joint: Secondary | ICD-10-CM

## 2016-05-18 DIAGNOSIS — R52 Pain, unspecified: Secondary | ICD-10-CM

## 2016-05-18 DIAGNOSIS — E349 Endocrine disorder, unspecified: Secondary | ICD-10-CM

## 2016-05-18 DIAGNOSIS — D509 Iron deficiency anemia, unspecified: Secondary | ICD-10-CM

## 2016-05-18 DIAGNOSIS — E559 Vitamin D deficiency, unspecified: Secondary | ICD-10-CM

## 2016-05-18 DIAGNOSIS — D631 Anemia in chronic kidney disease: Secondary | ICD-10-CM

## 2016-05-18 LAB — CBC WITH DIFFERENTIAL (CANCER CENTER ONLY)
BASO#: 0.1 10*3/uL (ref 0.0–0.2)
BASO%: 0.7 % (ref 0.0–2.0)
EOS%: 1.2 % (ref 0.0–7.0)
Eosinophils Absolute: 0.1 10*3/uL (ref 0.0–0.5)
HEMATOCRIT: 38.4 % — AB (ref 38.7–49.9)
HEMOGLOBIN: 12.8 g/dL — AB (ref 13.0–17.1)
LYMPH#: 1.4 10*3/uL (ref 0.9–3.3)
LYMPH%: 15.7 % (ref 14.0–48.0)
MCH: 24.9 pg — AB (ref 28.0–33.4)
MCHC: 33.3 g/dL (ref 32.0–35.9)
MCV: 75 fL — AB (ref 82–98)
MONO#: 0.8 10*3/uL (ref 0.1–0.9)
MONO%: 9.3 % (ref 0.0–13.0)
NEUT%: 73.1 % (ref 40.0–80.0)
NEUTROS ABS: 6.5 10*3/uL (ref 1.5–6.5)
Platelets: 238 10*3/uL (ref 145–400)
RBC: 5.15 10*6/uL (ref 4.20–5.70)
RDW: 16.8 % — ABNORMAL HIGH (ref 11.1–15.7)
WBC: 8.9 10*3/uL (ref 4.0–10.0)

## 2016-05-18 LAB — CMP (CANCER CENTER ONLY)
ALBUMIN: 3.8 g/dL (ref 3.3–5.5)
ALT(SGPT): 25 U/L (ref 10–47)
AST: 26 U/L (ref 11–38)
Alkaline Phosphatase: 77 U/L (ref 26–84)
BILIRUBIN TOTAL: 0.6 mg/dL (ref 0.20–1.60)
BUN, Bld: 19 mg/dL (ref 7–22)
CALCIUM: 8.8 mg/dL (ref 8.0–10.3)
CHLORIDE: 102 meq/L (ref 98–108)
CO2: 28 meq/L (ref 18–33)
CREATININE: 2.2 mg/dL — AB (ref 0.6–1.2)
Glucose, Bld: 176 mg/dL — ABNORMAL HIGH (ref 73–118)
Potassium: 3.6 mEq/L (ref 3.3–4.7)
SODIUM: 142 meq/L (ref 128–145)
TOTAL PROTEIN: 7.8 g/dL (ref 6.4–8.1)

## 2016-05-18 LAB — IRON AND TIBC
%SAT: 20 % (ref 20–55)
IRON: 47 ug/dL (ref 42–163)
TIBC: 229 ug/dL (ref 202–409)
UIBC: 182 ug/dL (ref 117–376)

## 2016-05-18 LAB — FERRITIN: Ferritin: 1336 ng/ml — ABNORMAL HIGH (ref 22–316)

## 2016-05-18 MED ORDER — SODIUM CHLORIDE 0.9 % IV SOLN
60.0000 mg | Freq: Once | INTRAVENOUS | Status: AC
  Administered 2016-05-18: 60 mg via INTRAVENOUS
  Filled 2016-05-18: qty 10

## 2016-05-18 MED ORDER — TESTOSTERONE CYPIONATE 200 MG/ML IM SOLN: 300.0000 mg | INTRAMUSCULAR | Status: DC

## 2016-05-18 MED ORDER — MORPHINE SULFATE (PF) 4 MG/ML IV SOLN
INTRAVENOUS | Status: AC
Start: 2016-05-18 — End: 2016-05-18
  Filled 2016-05-18: qty 1

## 2016-05-18 MED ORDER — MORPHINE SULFATE 4 MG/ML IJ SOLN
4.0000 mg | Freq: Once | INTRAMUSCULAR | Status: AC
  Administered 2016-05-18: 4 mg via INTRAVENOUS
  Filled 2016-05-18: qty 1

## 2016-05-18 MED ORDER — TESTOSTERONE CYPIONATE 200 MG/ML IM SOLN
INTRAMUSCULAR | Status: AC
  Filled 2016-05-18: qty 2

## 2016-05-18 NOTE — Patient Instructions (Signed)
Pamidronate injection What is this medicine? PAMIDRONATE (pa mi DROE nate) slows calcium loss from bones. It is used to treat high calcium blood levels from cancer or Paget's disease. It is also used to treat bone pain and prevent fractures from certain cancers that have spread to the bone. This medicine may be used for other purposes; ask your health care provider or pharmacist if you have questions. COMMON BRAND NAME(S): Aredia What should I tell my health care provider before I take this medicine? They need to know if you have any of these conditions: -aspirin-sensitive asthma -dental disease -kidney disease -an unusual or allergic reaction to pamidronate, other medicines, foods, dyes, or preservatives -pregnant or trying to get pregnant -breast-feeding How should I use this medicine? This medicine is for infusion into a vein. It is given by a health care professional in a hospital or clinic setting. Talk to your pediatrician regarding the use of this medicine in children. This medicine is not approved for use in children. Overdosage: If you think you have taken too much of this medicine contact a poison control center or emergency room at once. NOTE: This medicine is only for you. Do not share this medicine with others. What if I miss a dose? This does not apply. What may interact with this medicine? -certain antibiotics given by injection -medicines for inflammation or pain like ibuprofen, naproxen -some diuretics like bumetanide, furosemide -cyclosporine -parathyroid hormone -tacrolimus -teriparatide -thalidomide This list may not describe all possible interactions. Give your health care provider a list of all the medicines, herbs, non-prescription drugs, or dietary supplements you use. Also tell them if you smoke, drink alcohol, or use illegal drugs. Some items may interact with your medicine. What should I watch for while using this medicine? Visit your doctor or health care  professional for regular checkups. It may be some time before you see the benefit from this medicine. Do not stop taking your medicine unless your doctor tells you to. Your doctor may order blood tests or other tests to see how you are doing. Women should inform their doctor if they wish to become pregnant or think they might be pregnant. There is a potential for serious side effects to an unborn child. Talk to your health care professional or pharmacist for more information. You should make sure that you get enough calcium and vitamin D while you are taking this medicine. Discuss the foods you eat and the vitamins you take with your health care professional. Some people who take this medicine have severe bone, joint, and/or muscle pain. This medicine may also increase your risk for a broken thigh bone. Tell your doctor right away if you have pain in your upper leg or groin. Tell your doctor if you have any pain that does not go away or that gets worse. What side effects may I notice from receiving this medicine? Side effects that you should report to your doctor or health care professional as soon as possible: -allergic reactions like skin rash, itching or hives, swelling of the face, lips, or tongue -black or tarry stools -changes in vision -eye inflammation, pain -high blood pressure -jaw pain, especially burning or cramping -muscle weakness -numb, tingling pain -swelling of feet or hands -trouble passing urine or change in the amount of urine -unable to move easily Side effects that usually do not require medical attention (report to your doctor or health care professional if they continue or are bothersome): -bone, joint, or muscle pain -constipation -dizzy, drowsy -  fever -headache -loss of appetite -nausea, vomiting -pain at site where injected This list may not describe all possible side effects. Call your doctor for medical advice about side effects. You may report side effects to  FDA at 1-800-FDA-1088. Where should I keep my medicine? This drug is given in a hospital or clinic and will not be stored at home. NOTE: This sheet is a summary. It may not cover all possible information. If you have questions about this medicine, talk to your doctor, pharmacist, or health care provider.  2018 Elsevier/Gold Standard (2010-08-08 08:49:49)  

## 2016-05-18 NOTE — Progress Notes (Signed)
Pamidronate early per [facility].  Ok to give per dr. [name] .  No testosterone today due to high levels at last lab draw.

## 2016-05-18 NOTE — Progress Notes (Signed)
Hematology and Oncology Follow Up Visit  [name] [id number] 18-Mar-1950 66 y.o. 05/18/2016   Principle Diagnosis:  1. IgG kappa myeloma. 2. Anemia secondary to renal insufficiency. 3. Intermittent iron-deficiency anemia. 4. Hypotestosteronemia  Current Therapy:   1. Aredia 60 mg IV q.2 months  2. Aranesp 300 mcg subcu as needed for hemoglobin less than 11. 3. Depo-Testosterone 300 mg q.2 weeks. 4. IV iron as indicated.     Interim History:  Mr.  Turton is back for followup.  He is having some pain issues.  His pain medication a few weeks ago. His regular doctor is out of the country right now at a family funeral. Mr. Rokosz clearly has a worsening bone issue. He has a lot of bony pain. He does have myeloma but this has not been a problem for him. His last M spike was 1 g/dL. His IgG level was 1715 milligrams per deciliter. His Kappa Lightchain was 3.2 mg/dL. All this is holding pretty steady.   His last testosterone level was 1469ng/dL. This is holding pretty steady.   There's not been any problems with fever. He's had no bleeding. His blood sugars have been doing pretty well.   He does get his Aredia. This has helped.   He says that his Port-A-Cath has been bothering him a little bit. This seems to happen after he gets accessed. It does not get red.  Overall, his performance status is ECOG 1.   Medications:  Current Outpatient Prescriptions:  .  amLODipine (NORVASC) 10 MG tablet, Take 10 mg by mouth daily., Disp: , Rfl:  .  CARAFATE 1 GM/10ML suspension, Take 1 g by mouth 4 (four) times daily. , Disp: , Rfl:  .  diazepam (VALIUM) 5 MG tablet, Take 5 mg by mouth every 4 (four) hours as needed for anxiety., Disp: , Rfl:  .  dicyclomine (BENTYL) 10 MG capsule, Take 1 capsule (10 mg total) by mouth 3 (three) times daily., Disp: 90 capsule, Rfl: 2 .  furosemide (LASIX) 40 MG tablet, Take 40 mg by mouth., Disp: , Rfl:  .  glipiZIDE (GLUCOTROL) 5 MG tablet, Take 5 mg by  mouth daily before breakfast., Disp: , Rfl:  .  Hypromellose (NATURAL BALANCE TEARS OP), Apply 1 drop to eye every morning. , Disp: , Rfl:  .  Linaclotide (LINZESS) 145 MCG CAPS capsule, Take 1 capsule (145 mcg total) by mouth daily before breakfast., Disp: 30 capsule, Rfl: 2 .  meclizine (ANTIVERT) 50 MG tablet, Take 0.5 tablets (25 mg total) by mouth 4 (four) times daily as needed for dizziness., Disp: 30 tablet, Rfl: 0 .  meloxicam (MOBIC) 15 MG tablet, Take 15 mg by mouth., Disp: , Rfl:  .  mesalamine (LIALDA) 1.2 g EC tablet, Take 1,200 mg by mouth., Disp: , Rfl:  .  methocarbamol (ROBAXIN) 500 MG tablet, Take 500 mg by mouth 2 (two) times daily as needed for muscle spasms. , Disp: , Rfl:  .  metoprolol succinate (TOPROL XL) 50 MG 24 hr tablet, Take 50 mg by mouth., Disp: , Rfl:  .  omeprazole (PRILOSEC) 40 MG capsule, Take 1 capsule (40 mg total) by mouth 2 (two) times daily., Disp: 60 capsule, Rfl: 5 .  ondansetron (ZOFRAN ODT) 4 MG disintegrating tablet, Take 1 tablet (4 mg total) by mouth every 8 (eight) hours as needed for nausea or vomiting., Disp: 10 tablet, Rfl: 0 .  oxyCODONE 30 MG 12 hr tablet, Take 1 pill every 8 hours, Disp: 42 tablet,  Rfl: 0 .  oxyCODONE-acetaminophen (PERCOCET) 10-325 MG tablet, Take 1 tablet by mouth every 6 (six) hours as needed for pain., Disp: 30 tablet, Rfl: 0 .  Polyvinyl Alcohol-Povidone (CLEAR EYES ALL SEASONS) 5-6 MG/ML SOLN, Apply 2 drops to eye 3 (three) times daily as needed (drye eyes.). , Disp: , Rfl:  .  potassium chloride SA (K-DUR,KLOR-CON) 20 MEQ tablet, Take 1 tablet (20 mEq total) by mouth 2 (two) times daily., Disp: 60 tablet, Rfl: 6 .  prochlorperazine (COMPAZINE) 10 MG tablet, Take 1 tablet (10 mg total) by mouth every 6 (six) hours as needed for nausea or vomiting., Disp: 60 tablet, Rfl: 2 .  Sennosides (LAXATIVE PILLS PO), Take 1 tablet by mouth daily as needed (constipation)., Disp: , Rfl:  .  sildenafil (VIAGRA) 100 MG tablet, Take 100 mg  by mouth., Disp: , Rfl:  .  Tamsulosin HCl (FLOMAX) 0.4 MG CAPS, Take 0.4 mg by mouth daily. , Disp: , Rfl:  .  triamterene-hydrochlorothiazide (MAXZIDE-25) 37.5-25 MG per tablet, Take 1 tablet by mouth daily.  , Disp: , Rfl:  .  Vitamin D, Ergocalciferol, (DRISDOL) 50000 UNITS CAPS capsule, Take 50,000 Units by mouth every 7 (seven) days., Disp: , Rfl:  No current facility-administered medications for this visit.   Facility-Administered Medications Ordered in Other Visits:  .  morphine 4 MG/ML injection 4 mg, 4 mg, Intravenous, Q4H PRN, Volanda Napoleon, MD .  pamidronate (AREDIA) 60 mg in sodium chloride 0.9 % 500 mL IVPB, 60 mg, Intravenous, Once, [place], NP .  testosterone cypionate (DEPOTESTOSTERONE CYPIONATE) injection 300 mg, 300 mg, Intramuscular, Q14 Days, Volanda Napoleon, MD  Allergies:  Allergies  Allergen Reactions  . Iodine Anxiety and Rash    Didn't feel right "instructed not to take per MD--something with his port"    Past Medical History, Surgical history, Social history, and Family History were reviewed and updated.  Review of Systems: As above  Physical Exam:  weight is 205 lb 1.9 oz (93 kg). His oral temperature is 98.2 F (36.8 C). His blood pressure is 168/83 (abnormal) and his pulse is 97. His respiration is 17 and oxygen saturation is 100%.   His head and neck exam shows no ocular or oral lesions. He has no adenopathy in the neck. His thyroid is non-palpable. Lungs are clear. Cardiac exam regular rate and rhythm with no murmurs, rubs or bruits.. Abdomen is soft. His abdomen is slightly distended. He has good bowel sounds. There is no fluid wave. There is no palpable liver or spleen tip. Back exam shows no tenderness in the lumbar spine. There is no paravertebral muscle spasms. . He has slightly decreased range of motion of the lower back. Extremities shows no clubbing cyanosis or edema.He does have a swelling in the posterior right shoulder. There is some  tenderness in this area to palpation. He has fairly decent range of motion of the right arm. He has decent strength in his legs. Skin exam shows no rashes. Neurological exam is nonfocal.  Lab Results  Component Value Date   WBC 8.9 05/18/2016   HGB 12.8 (L) 05/18/2016   HCT 38.4 (L) 05/18/2016   MCV 75 (L) 05/18/2016   PLT 238 05/18/2016     Chemistry      Component Value Date/Time   NA 142 05/18/2016 0817   K 3.6 05/18/2016 0817   CL 102 05/18/2016 0817   CO2 28 05/18/2016 0817   BUN 19 05/18/2016 0817   CREATININE  2.2 (H) 05/18/2016 0817      Component Value Date/Time   CALCIUM 8.8 05/18/2016 0817   ALKPHOS 77 05/18/2016 0817   AST 26 05/18/2016 0817   ALT 25 05/18/2016 0817   BILITOT 0.60 05/18/2016 0817         Impression and Plan: Mr. Bua is a 66 year old gentleman. He has multiple medical problems. I still do not think that myeloma is an issue. With his monoclonal spike holding  steady, I still do not think there is any indication that his myeloma needs to be treated.  We will go ahead and give him Aredia today.  His creatinine is stable at 2.2. I don't think that the renal insufficiency is from myeloma. I believe that the renal insufficiency is more from diabetes and possibly hypertension.  Back in February, his iron studies looked all right. His ferritin is quite high because it is an acute phase reactant.  I will see him back to see me in 2 more months. He'll get his Aredia today.  Volanda Napoleon, MD 3/26/20188:59 AM

## 2016-05-19 LAB — KAPPA/LAMBDA LIGHT CHAINS
Ig Kappa Free Light Chain: 34.9 mg/L — ABNORMAL HIGH (ref 3.3–19.4)
Ig Lambda Free Light Chain: 23.5 mg/L (ref 5.7–26.3)
KAPPA/LAMBDA FLC RATIO: 1.49 (ref 0.26–1.65)

## 2016-05-19 LAB — TESTOSTERONE: TESTOSTERONE: 914 ng/dL (ref 264–916)

## 2016-05-19 LAB — IGG, IGA, IGM
IgA, Qn, Serum: 290 mg/dL (ref 61–437)
IgG, Qn, Serum: 1626 mg/dL — ABNORMAL HIGH (ref 700–1600)
IgM, Qn, Serum: 64 mg/dL (ref 20–172)

## 2016-05-21 LAB — PROTEIN ELECTROPHORESIS, SERUM, WITH REFLEX
A/G RATIO SPE: 0.9 (ref 0.7–1.7)
ALBUMIN: 3.5 g/dL (ref 2.9–4.4)
ALPHA 1: 0.2 g/dL (ref 0.0–0.4)
ALPHA 2: 1.1 g/dL — AB (ref 0.4–1.0)
Beta: 0.9 g/dL (ref 0.7–1.3)
GLOBULIN, TOTAL: 4 g/dL — AB (ref 2.2–3.9)
Gamma Globulin: 1.8 g/dL (ref 0.4–1.8)
INTERPRETATION(SEE BELOW): 0
M-Spike, %: 1 g/dL — ABNORMAL HIGH
Total Protein: 7.5 g/dL (ref 6.0–8.5)

## 2016-05-22 ENCOUNTER — Emergency Department (HOSPITAL_COMMUNITY): Payer: Medicare Other

## 2016-05-22 ENCOUNTER — Emergency Department (HOSPITAL_COMMUNITY)
Admission: EM | Admit: 2016-05-22 | Discharge: 2016-05-22 | Disposition: A | Discharge status: Home / Self Care | Payer: Medicare Other | Attending: Emergency Medicine | Admitting: Emergency Medicine

## 2016-05-22 DIAGNOSIS — R51 Headache: Secondary | ICD-10-CM | POA: Insufficient documentation

## 2016-05-22 DIAGNOSIS — R519 Headache, unspecified: Secondary | ICD-10-CM

## 2016-05-22 DIAGNOSIS — I10 Essential (primary) hypertension: Secondary | ICD-10-CM | POA: Diagnosis not present

## 2016-05-22 DIAGNOSIS — Z7984 Long term (current) use of oral hypoglycemic drugs: Secondary | ICD-10-CM | POA: Insufficient documentation

## 2016-05-22 DIAGNOSIS — Z8579 Personal history of other malignant neoplasms of lymphoid, hematopoietic and related tissues: Secondary | ICD-10-CM | POA: Diagnosis not present

## 2016-05-22 DIAGNOSIS — E1140 Type 2 diabetes mellitus with diabetic neuropathy, unspecified: Secondary | ICD-10-CM | POA: Diagnosis not present

## 2016-05-22 MED ORDER — DIPHENHYDRAMINE HCL 50 MG/ML IJ SOLN
25.0000 mg | Freq: Once | INTRAMUSCULAR | Status: AC
  Administered 2016-05-22: 25 mg via INTRAMUSCULAR
  Filled 2016-05-22: qty 1

## 2016-05-22 MED ORDER — ORPHENADRINE CITRATE ER 100 MG PO TB12: 100.0000 mg | ORAL_TABLET | Freq: Two times a day (BID) | ORAL | 0 refills | Status: DC

## 2016-05-22 MED ORDER — METOCLOPRAMIDE HCL 5 MG/ML IJ SOLN
10.0000 mg | Freq: Once | INTRAMUSCULAR | Status: AC
  Administered 2016-05-22: 10 mg via INTRAMUSCULAR
  Filled 2016-05-22: qty 2

## 2016-05-22 NOTE — ED Triage Notes (Signed)
Pt reports frontal headache x1 month. Pt reports he has taken pain meds that he has at home for something else but it has not helped. Reports he has mutliple myeloma also. Pt a/ox4, resp e/u, nad.

## 2016-05-22 NOTE — ED Notes (Signed)
Patient returned back from CT. 

## 2016-05-22 NOTE — ED Provider Notes (Signed)
[facility] DEPT Provider Note   CSN: [id number] Arrival date & time: 05/22/16  0846     History   Chief Complaint Chief Complaint  Patient presents with  . Headache    HPI [name] is a 66 y.o. male.  HPI Patient states he's had a headache for about a month. He reports it is severe in daily. It is aching and feels like an intense pressure. No fever but sometimes nausea but no vomiting. He has not had double vision or decreased vision. He reports he was seen recently by his ophthalmologist with no acute changes. No fevers. No change in gait. No focal weakness numbness or tinging. No sinus drainage or discharge. Patient does have Percocet at home to take for pain but he reports is not helping. He has known severe spinal tous and degenerative cervical spine disease. He reports he is due for surgical intervention in about 2 weeks. Past Medical History:  Diagnosis Date  . Anemia   . 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 Anemia, iron deficiency 03/12/2011  . Anxiety disorder   . Arthralgia of multiple joints 12/31/2010  . Arthritis   . Constipation   . Diabetes mellitus without complication ([place])   . Diabetic neuropathy ([place])   . Diverticulosis   . Hemorrhoids   . Herpes zoster   . Hyperlipidemia   . Hypertension   . Hypotestosteronism 03/17/2012  . Multiple myeloma   . Multiple myeloma ([place])   . Myeloma ([place]) 12/31/2010  . Renal insufficiency   . Type 2 diabetes mellitus ([place])   . Urinary retention     Patient Active Problem List   Diagnosis Date Noted  . Anemia of chronic renal failure 12/17/2015  . Preventive measure 12/17/2015  . Type 2 diabetes mellitus ([place]) 06/05/2015  . Edema extremities 06/05/2015  . Benign hypertension 06/05/2015  . Abdominal pain in male   . Right sided abdominal pain   . Lower abdominal pain   . Multiple myeloma ([place])   . Acute abdominal pain 11/14/2014  . Generalized abdominal pain   . Esophageal dysphagia   . Hypertensive lower  esophageal sphincter   . Dysphagia 09/26/2014  . Rectal bleeding 09/26/2014  . Nausea with vomiting 09/26/2014  . Constipation 09/26/2014  . Hypotestosteronism 03/17/2012  . Can't get food down 01/17/2012  . Hematochezia 07/12/2011  . Anemia, iron deficiency 03/12/2011  . 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 Myeloma ([place]) 12/31/2010  . Diabetes mellitus, type II ([place]) 12/31/2010  . Arthralgia of multiple joints 12/31/2010  . GERD 03/09/2008  . DIVERTICULOSIS-COLON 03/09/2008  . Other dysphagia 03/09/2008    Past Surgical History:  Procedure Laterality Date  . CATARACT EXTRACTION     bi-lateral  . COLONOSCOPY    . COLONOSCOPY  07/13/2011   Procedure: COLONOSCOPY;  Surgeon: Ladene Artist, MD,FACG;  Location: [facility] ENDOSCOPY;  Service: Endoscopy;  Laterality: N/A;  . ESOPHAGOGASTRODUODENOSCOPY    . KNEE ARTHROSCOPY     left  . KNEE SURGERY    . PORTACATH PLACEMENT    . TRANSURETHRAL RESECTION OF PROSTATE  01/2011   Dr. at [facility] in [place]       Home Medications    Prior to Admission medications   Medication Sig Start Date End Date Taking? Authorizing Provider  amLODipine (NORVASC) 10 MG tablet Take 10 mg by mouth daily.   Yes Historical Provider, MD  CARAFATE 1 GM/10ML suspension Take 1 g by mouth 4 (four) times daily.  07/28/11  Yes Historical Provider, MD  ciprofloxacin (CIPRO) 500 MG tablet Take 500 mg by mouth 2 (two) times daily. 05/19/16 05/29/16 Yes Historical Provider, MD  furosemide (LASIX) 40 MG tablet Take 40 mg by mouth.   Yes Historical Provider, MD  glipiZIDE (GLUCOTROL) 5 MG tablet Take 5 mg by mouth 2 (two) times daily before a meal.    Yes Historical Provider, MD  isosorbide mononitrate (IMDUR) 30 MG 24 hr tablet Take 30 mg by mouth daily.   Yes Historical Provider, MD  metoprolol succinate (TOPROL XL) 50 MG 24 hr tablet Take 50 mg by mouth. 06/05/15  Yes Historical Provider, MD  mirabegron ER (MYRBETRIQ) 50 MG TB24 tablet Take 50 mg by mouth daily.    Yes Historical Provider, MD  Morphine-Naltrexone (EMBEDA) 20-0.8 MG CPCR Take 1 capsule by mouth 3 (three) times daily.   Yes Historical Provider, MD  omeprazole (PRILOSEC) 40 MG capsule Take 1 capsule (40 mg total) by mouth 2 (two) times daily. 01/01/16  Yes Ladene Artist, MD  oxyCODONE-acetaminophen (PERCOCET) 10-325 MG tablet Take 1 tablet by mouth every 6 (six) hours as needed for pain. 03/23/16  Yes Volanda Napoleon, MD  Polyvinyl Alcohol-Povidone (CLEAR EYES ALL SEASONS) 5-6 MG/ML SOLN Apply 2 drops to eye 3 (three) times daily as needed (drye eyes.).    Yes Historical Provider, MD  Tafluprost (ZIOPTAN) 0.0015 % SOLN Place 1 drop into the right eye daily.   Yes Historical Provider, MD  Vitamin D, Ergocalciferol, (DRISDOL) 50000 UNITS CAPS capsule Take 50,000 Units by mouth every Saturday.    Yes Historical Provider, MD  dicyclomine (BENTYL) 10 MG capsule Take 1 capsule (10 mg total) by mouth 3 (three) times daily. Patient not taking: Reported on 05/22/2016 11/16/14   Laban Emperor Zehr, PA-C  Linaclotide [facility]) 145 MCG CAPS capsule Take 1 capsule (145 mcg total) by mouth daily before breakfast. Patient not taking: Reported on 05/22/2016 11/16/14   Laban Emperor Zehr, PA-C  meclizine (ANTIVERT) 50 MG tablet Take 0.5 tablets (25 mg total) by mouth 4 (four) times daily as needed for dizziness. Patient not taking: Reported on 05/22/2016 12/13/11   Virgel Manifold, MD  ondansetron (ZOFRAN ODT) 4 MG disintegrating tablet Take 1 tablet (4 mg total) by mouth every 8 (eight) hours as needed for nausea or vomiting. Patient not taking: Reported on 05/22/2016 09/20/14   Alvina Chou, PA-C  orphenadrine (NORFLEX) 100 MG tablet Take 1 tablet (100 mg total) by mouth 2 (two) times daily. 05/22/16   Charlesetta Shanks, MD  oxyCODONE 30 MG 12 hr tablet Take 1 pill every 8 hours Patient not taking: Reported on 05/22/2016 03/23/16   Volanda Napoleon, MD  potassium chloride SA (K-DUR,KLOR-CON) 20 MEQ tablet Take 1 tablet (20 mEq  total) by mouth 2 (two) times daily. Patient not taking: Reported on 05/22/2016 11/07/14   Volanda Napoleon, MD  prochlorperazine (COMPAZINE) 10 MG tablet Take 1 tablet (10 mg total) by mouth every 6 (six) hours as needed for nausea or vomiting. Patient not taking: Reported on 05/22/2016 04/18/14   Volanda Napoleon, MD    Family History Family History  Problem Relation Age of Onset  . Breast cancer Sister   . Breast cancer Mother   . Heart disease Mother   . Diabetes Sister   . Bone cancer Father   . Heart disease Sister   . Heart disease Daughter   . Throat cancer Brother   . Stomach cancer Brother   . Colon cancer  Neg Hx     Social History Social History  Substance Use Topics  . Smoking status: Never Smoker  . Smokeless tobacco: Never Used     Comment: never used tobacco  . Alcohol use No     Allergies   Iodine   Review of Systems Review of Systems  10 Systems reviewed and are negative for acute change except as noted in the HPI.  Physical Exam Updated Vital Signs BP (!) 158/96   Pulse 83   Temp 98.4 F (36.9 C)   Resp 20   SpO2 99%   Physical Exam  Constitutional: He is oriented to person, place, and time. He appears well-developed and well-nourished.  HENT:  Head: Normocephalic and atraumatic.  Bilateral TMs normal. Nares patent. Posterior oropharynx widely patent without erythema. Patient is edentulous on top. Lower dentition present without any facial swelling or tenderness. No sinus tenderness to percussion. No local tenderness to palpation over the temples.  Eyes: Conjunctivae and EOM are normal. Pupils are equal, round, and reactive to light.  2 mm and patient has cataract repairs.  Neck: Neck supple. No thyromegaly present.  No significant tenderness to palpation over paraspinous muscle bodies of the neck.  Cardiovascular: Normal rate and regular rhythm.   No murmur heard. Pulmonary/Chest: Effort normal and breath sounds normal. No stridor. No  respiratory distress.  Abdominal: Soft. There is no tenderness.  Musculoskeletal: He exhibits no edema or tenderness.  Lymphadenopathy:    He has no cervical adenopathy.  Neurological: He is alert and oriented to person, place, and time. No cranial nerve deficit. He exhibits normal muscle tone. Coordination normal.  Skin: Skin is warm and dry.  Psychiatric: He has a normal mood and affect.  Nursing note and vitals reviewed.    ED Treatments / Results  Labs (all labs ordered are listed, but only abnormal results are displayed) Labs Reviewed - No data to display  EKG  EKG Interpretation None       Radiology Ct Head Wo Contrast  Result Date: 05/22/2016 CLINICAL DATA:  Headache.  Nausea. EXAM: CT HEAD WITHOUT CONTRAST TECHNIQUE: Contiguous axial images were obtained from the base of the skull through the vertex without intravenous contrast. COMPARISON:  CT scan of May 15, 2015. FINDINGS: Brain: No evidence of acute infarction, hemorrhage, hydrocephalus, extra-axial collection or mass lesion/mass effect. Vascular: No hyperdense vessel or unexpected calcification. Skull: Normal. Negative for fracture or focal lesion. Sinuses/Orbits: No acute finding. Other: None. IMPRESSION: Normal head CT. Electronically Signed   By: Marijo Conception, M.D.   On: 05/22/2016 10:56    Procedures Procedures (including critical care time)  Medications Ordered in ED Medications  metoCLOPramide (REGLAN) injection 10 mg (10 mg Intramuscular Given 05/22/16 1009)  diphenhydrAMINE (BENADRYL) injection 25 mg (25 mg Intramuscular Given 05/22/16 1008)     Initial Impression / Assessment and Plan / ED Course  I have reviewed the triage vital signs and the nursing notes.  Pertinent labs & imaging results that were available during my care of the patient were reviewed by me and considered in my medical decision making (see chart for details).      Final Clinical Impressions(s) / ED Diagnoses   Final  diagnoses:  Bad headache  Patient has persistent and generalized headache. No associated neurologic symptoms or infectious symptoms. At this time, etiology is unclear. Review be MR indicates patient has severe degenerative disc disease of his cervical spine. Consideration is that this is in association with cervical spine disease.  He is scheduled for surgery in about 2 weeks. There is been no decompensation in his baseline neurological function however. At this time, I will have him add Norflex to his routine Percocet to see if this helps at all. Patient is counseled to follow-up with his family doctor and discuss possible referral to neurology if this headache is persisting despite ongoing treatment.  New Prescriptions New Prescriptions   ORPHENADRINE (NORFLEX) 100 MG TABLET    Take 1 tablet (100 mg total) by mouth 2 (two) times daily.     Charlesetta Shanks, MD 05/22/16 1120

## 2016-05-22 NOTE — ED Notes (Signed)
PT reports he spoke with EDP about going home and is agreeable with discharge plan.

## 2016-05-22 NOTE — Discharge Instructions (Signed)
At this time, it is unclear why you have an ongoing headache. It may be in association with the problems with disc disease and arthritis you have in your neck. Try taking Norflex when you take your Percocet for pain. Follow-up with your doctor and discuss possible referral to a neurologist if your headache is ongoing and no cause can be identified.

## 2016-05-27 NOTE — Pre-Procedure Instructions (Signed)
[name]  05/27/2016      [facility], [place] [address] [address] Phone: [phone] Fax: [phone]    Your procedure is scheduled on April 13.  Report to [facility] Admitting at 1150 A.M.  Call this number if you have problems the morning of surgery:  [phone]   Remember:  Do not eat food or drink liquids after midnight.  Take these medicines the morning of surgery with A SIP OF WATER amlodipine (Norvasc), Isosorbide mononitrate (Imdur), Metoprolol succinate (Toprol XL), Mirabegron ER (Myrbetriq), Morphine (Embeda),  Omeprazole (Prilosec), Orphenadrine (Norflex), Oxycodone (Percocet) if needed, eye drops   Stop taking aspirin, BC's, Goody's, Herbal medications, Fish Oil, Ibuprofen, Advil, Motrin, Aleve, Vitamins    How to Manage Your Diabetes Before and After Surgery  Why is it important to control my blood sugar before and after surgery? . Improving blood sugar levels before and after surgery helps healing and can limit problems. . A way of improving blood sugar control is eating a healthy diet by: o  Eating less sugar and carbohydrates o  Increasing activity/exercise o  Talking with your doctor about reaching your blood sugar goals . High blood sugars (greater than 180 mg/dL) can raise your risk of infections and slow your recovery, so you will need to focus on controlling your diabetes during the weeks before surgery. . Make sure that the doctor who takes care of your diabetes knows about your planned surgery including the date and location.  How do I manage my blood sugar before surgery? . Check your blood sugar at least 4 times a day, starting 2 days before surgery, to make sure that the level is not too high or low. o Check your blood sugar the morning of your surgery when you wake up and every 2 hours until you get to the Short Stay unit. . If your blood sugar is less  than 70 mg/dL, you will need to treat for low blood sugar: o Do not take insulin. o Treat a low blood sugar (less than 70 mg/dL) with  cup of clear juice (cranberry or apple), 4 glucose tablets, OR glucose gel. o Recheck blood sugar in 15 minutes after treatment (to make sure it is greater than 70 mg/dL). If your blood sugar is not greater than 70 mg/dL on recheck, call [phone] for further instructions. . Report your blood sugar to the short stay nurse when you get to Short Stay.  . If you are admitted to the hospital after surgery: o Your blood sugar will be checked by the staff and you will probably be given insulin after surgery (instead of oral diabetes medicines) to make sure you have good blood sugar levels. o The goal for blood sugar control after surgery is 80-180 mg/dL.        WHAT DO I DO ABOUT MY DIABETES MEDICATION?   Marland Kitchen Do not take oral diabetes medicines (pills) the morning of surgery. Glipizide (Glucotrol)  . THE NIGHT BEFORE SURGERY, take ___________ units of ___________insulin.       Marland Kitchen HE MORNING OF SURGERY, take _____________ units of __________insulin.  . The day of surgery, do not take other diabetes injectables, including Byetta (exenatide), Bydureon (exenatide ER), Victoza (liraglutide), or Trulicity (dulaglutide).  . If your CBG is greater than 220 mg/dL, you may take  of your sliding scale (correction) dose of insulin.  Other Instructions:  Patient Signature:  Date:   Nurse Signature:  Date:   Reviewed and Endorsed by [facility] Patient Education Committee, August 2015  Do not wear jewelry, make-up or nail polish.  Do not wear lotions, powders, or perfumes, or deoderant.  Do not shave 48 hours prior to surgery.  Men may shave face and neck.  Do not bring valuables to the hospital.  [facility] is not responsible for any belongings or valuables.  Contacts, dentures or bridgework may not be worn into surgery.  Leave your suitcase in  the car.  After surgery it may be brought to your room.  For patients admitted to the hospital, discharge time will be determined by your treatment team.  Patients discharged the day of surgery will not be allowed to drive home.    Special instructions:  [place] - Preparing for Surgery  Before surgery, you can play an important role.  Because skin is not sterile, your skin needs to be as free of germs as possible.  You can reduce the number of germs on you skin by washing with CHG (chlorahexidine gluconate) soap before surgery.  CHG is an antiseptic cleaner which kills germs and bonds with the skin to continue killing germs even after washing.  Please DO NOT use if you have an allergy to CHG or antibacterial soaps.  If your skin becomes reddened/irritated stop using the CHG and inform your nurse when you arrive at Short Stay.  Do not shave (including legs and underarms) for at least 48 hours prior to the first CHG shower.  You may shave your face.  Please follow these instructions carefully:   1.  Shower with CHG Soap the night before surgery and the                                morning of Surgery.  2.  If you choose to wash your hair, wash your hair first as usual with your       normal shampoo.  3.  After you shampoo, rinse your hair and body thoroughly to remove the                      Shampoo.  4.  Use CHG as you would any other liquid soap.  You can apply chg directly       to the skin and wash gently with scrungie or a clean washcloth.  5.  Apply the CHG Soap to your body ONLY FROM THE NECK DOWN.        Do not use on open wounds or open sores.  Avoid contact with your eyes,       ears, mouth and genitals (private parts).  Wash genitals (private parts)       with your normal soap.  6.  Wash thoroughly, paying special attention to the area where your surgery        will be performed.  7.  Thoroughly rinse your body with warm water from the neck down.  8.  DO NOT shower/wash with  your normal soap after using and rinsing off       the CHG Soap.  9.  Pat yourself dry with a clean towel.            10.  Wear clean pajamas.            11.  Place clean sheets on your bed  the night of your first shower and do not        sleep with pets.  Day of Surgery  Do not apply any lotions/deoderants the morning of surgery.  Please wear clean clothes to the hospital/surgery center.     Please read over the following fact sheets that you were given. Pain Booklet, Coughing and Deep Breathing, MRSA Information and Surgical Site Infection Prevention

## 2016-05-28 ENCOUNTER — Encounter (HOSPITAL_COMMUNITY): Payer: Self-pay

## 2016-05-28 ENCOUNTER — Encounter (HOSPITAL_COMMUNITY)
Admission: RE | Admit: 2016-05-28 | Discharge: 2016-05-28 | Disposition: A | Discharge status: Home / Self Care | Payer: Medicare Other | Source: Ambulatory Visit | Attending: Neurosurgery | Admitting: Neurosurgery

## 2016-05-28 DIAGNOSIS — D219 Benign neoplasm of connective and other soft tissue, unspecified: Secondary | ICD-10-CM | POA: Insufficient documentation

## 2016-05-28 DIAGNOSIS — N289 Disorder of kidney and ureter, unspecified: Secondary | ICD-10-CM | POA: Diagnosis not present

## 2016-05-28 DIAGNOSIS — Z8579 Personal history of other malignant neoplasms of lymphoid, hematopoietic and related tissues: Secondary | ICD-10-CM | POA: Diagnosis not present

## 2016-05-28 DIAGNOSIS — I10 Essential (primary) hypertension: Secondary | ICD-10-CM | POA: Diagnosis not present

## 2016-05-28 DIAGNOSIS — D509 Iron deficiency anemia, unspecified: Secondary | ICD-10-CM | POA: Insufficient documentation

## 2016-05-28 DIAGNOSIS — Z01818 Encounter for other preprocedural examination: Secondary | ICD-10-CM | POA: Insufficient documentation

## 2016-05-28 DIAGNOSIS — E119 Type 2 diabetes mellitus without complications: Secondary | ICD-10-CM | POA: Diagnosis not present

## 2016-05-28 DIAGNOSIS — E785 Hyperlipidemia, unspecified: Secondary | ICD-10-CM | POA: Diagnosis not present

## 2016-05-28 HISTORY — DX: Headache: R51

## 2016-05-28 HISTORY — DX: Anxiety disorder, unspecified: F41.9

## 2016-05-28 HISTORY — DX: Fibromyalgia: M79.7

## 2016-05-28 HISTORY — DX: Gastro-esophageal reflux disease without esophagitis: K21.9

## 2016-05-28 HISTORY — DX: Headache, unspecified: R51.9

## 2016-05-28 HISTORY — DX: Pneumonia, unspecified organism: J18.9

## 2016-05-28 LAB — BASIC METABOLIC PANEL
ANION GAP: 8 (ref 5–15)
BUN: 22 mg/dL — ABNORMAL HIGH (ref 6–20)
CALCIUM: 9 mg/dL (ref 8.9–10.3)
CHLORIDE: 106 mmol/L (ref 101–111)
CO2: 23 mmol/L (ref 22–32)
Creatinine, Ser: 2.34 mg/dL — ABNORMAL HIGH (ref 0.61–1.24)
GFR calc Af Amer: 32 mL/min — ABNORMAL LOW (ref >60)
GFR calc non Af Amer: 27 mL/min — ABNORMAL LOW (ref >60)
GLUCOSE: 118 mg/dL — AB (ref 65–99)
Potassium: 4 mmol/L (ref 3.5–5.1)
Sodium: 137 mmol/L (ref 135–145)

## 2016-05-28 LAB — CBC
HCT: 38.2 % — ABNORMAL LOW (ref 39.0–52.0)
Hemoglobin: 12.6 g/dL — ABNORMAL LOW (ref 13.0–17.0)
MCH: 24.5 pg — AB (ref 26.0–34.0)
MCHC: 33 g/dL (ref 30.0–36.0)
MCV: 74.3 fL — AB (ref 78.0–100.0)
Platelets: 215 10*3/uL (ref 150–400)
RBC: 5.14 MIL/uL (ref 4.22–5.81)
RDW: 16.1 % — ABNORMAL HIGH (ref 11.5–15.5)
WBC: 6.2 10*3/uL (ref 4.0–10.5)

## 2016-05-28 LAB — TYPE AND SCREEN
ABO/RH(D): O POS
Antibody Screen: NEGATIVE

## 2016-05-28 LAB — GLUCOSE, CAPILLARY: GLUCOSE-CAPILLARY: 168 mg/dL — AB (ref 65–99)

## 2016-05-28 LAB — SURGICAL PCR SCREEN
MRSA, PCR: NEGATIVE
Staphylococcus aureus: NEGATIVE

## 2016-05-28 NOTE — Progress Notes (Signed)
PCP is Dr Tonita Phoenix he saw cardiology about a year ago, doesn't remember the Drs name. Denies ever having a card cath. States he had a stress test and echo about 1 year ago. Reports his fasting cbg's run 146-150. Request sent to Dr Jonelle Sidle for report of echo or stress test.

## 2016-05-29 ENCOUNTER — Other Ambulatory Visit: Payer: Self-pay | Admitting: *Deleted

## 2016-05-29 DIAGNOSIS — C9000 Multiple myeloma not having achieved remission: Secondary | ICD-10-CM

## 2016-05-29 LAB — HEMOGLOBIN A1C
HEMOGLOBIN A1C: 7.6 % — AB (ref 4.8–5.6)
MEAN PLASMA GLUCOSE: 171 mg/dL

## 2016-06-01 ENCOUNTER — Ambulatory Visit (HOSPITAL_BASED_OUTPATIENT_CLINIC_OR_DEPARTMENT_OTHER): Payer: Medicare Other

## 2016-06-01 ENCOUNTER — Other Ambulatory Visit: Payer: Medicare Other

## 2016-06-01 DIAGNOSIS — E291 Testicular hypofunction: Secondary | ICD-10-CM | POA: Diagnosis not present

## 2016-06-01 DIAGNOSIS — E349 Endocrine disorder, unspecified: Secondary | ICD-10-CM

## 2016-06-01 MED ORDER — TESTOSTERONE CYPIONATE 200 MG/ML IM SOLN
300.0000 mg | INTRAMUSCULAR | Status: DC
  Administered 2016-06-01: 300 mg via INTRAMUSCULAR

## 2016-06-01 MED ORDER — TESTOSTERONE CYPIONATE 200 MG/ML IM SOLN
INTRAMUSCULAR | Status: AC
  Filled 2016-06-01: qty 2

## 2016-06-01 NOTE — Progress Notes (Addendum)
Anesthesia Chart Review:  Pt is a 66 year old male scheduled for C3-6 cervical laminectomy with lateral mass fusion on 06/05/2016 with Consuella Lose, MD.   - PCP is Gala Romney, MD - Hem-Onc is Burney Gauze, MD - Pt saw Kela Millin, MD with cardiology 2013-2014, f/u prn recommended  PMH includes:  HTN, DM, hyperlipidemia, iron deficiency anemia, IgG kappa myeloma, renal insufficiency, GERD. Never smoker. BMI 28.5.  Medications include: Amlodipine, aranesp, Lasix, glipizide, Imdur, metoprolol, Prilosec  Preoperative labs reviewed.   - HbA1c 7.6, glucose 118 - Cr 2.34, BUN 22. This is consistent with prior labs.  - I will route lab results to PCP for follow up.   CXR 07/21/15: No acute cardiopulmonary disease. Borderline cardiomegaly with left base scarring.  EKG 05/28/16: NSR. Nonspecific T wave abnormality  Nuclear stress test 12/25/11 [facility]): 1. Resting EKG NSR. Stress EKG nondiagnostic for ischemia. 2. Perfusion imaging study demonstrates normal perfusion without ischemia or scar. Normal LVEF 78%.  Echo 12/23/11 [facility]): 1. LV cavity normal in size. Moderate concentric hypertrophy. Mild septal notching. Normal diastolic filling. Normal global wall motion. Normal systolic global function. EF 60-65%.  If no changes, I anticipate pt can proceed with surgery as scheduled.   Willeen Cass, FNP-BC [facility] Short Stay Surgical Center/Anesthesiology Phone: [phone] 06/02/2016 4:47 PM

## 2016-06-01 NOTE — Patient Instructions (Signed)
Testosterone injection What is this medicine? TESTOSTERONE (tes TOS ter one) is the main male hormone. It supports normal male development such as muscle growth, facial hair, and deep voice. It is used in males to treat low testosterone levels. This medicine may be used for other purposes; ask your health care provider or pharmacist if you have questions. COMMON BRAND NAME(S): Andro-L.A., Aveed, Delatestryl, Depo-Testosterone, Virilon What should I tell my health care provider before I take this medicine? They need to know if you have any of these conditions: -cancer -diabetes -heart disease -kidney disease -liver disease -lung disease -prostate disease -an unusual or allergic reaction to testosterone, other medicines, foods, dyes, or preservatives -pregnant or trying to get pregnant -breast-feeding How should I use this medicine? This medicine is for injection into a muscle. It is usually given by a health care professional in a hospital or clinic setting. Contact your pediatrician regarding the use of this medicine in children. While this medicine may be prescribed for children as young as 12 years of age for selected conditions, precautions do apply. Overdosage: If you think you have taken too much of this medicine contact a poison control center or emergency room at once. NOTE: This medicine is only for you. Do not share this medicine with others. What if I miss a dose? Try not to miss a dose. Your doctor or health care professional will tell you when your next injection is due. Notify the office if you are unable to keep an appointment. What may interact with this medicine? -medicines for diabetes -medicines that treat or prevent blood clots like warfarin -oxyphenbutazone -propranolol -steroid medicines like prednisone or cortisone This list may not describe all possible interactions. Give your health care provider a list of all the medicines, herbs, non-prescription drugs, or  dietary supplements you use. Also tell them if you smoke, drink alcohol, or use illegal drugs. Some items may interact with your medicine. What should I watch for while using this medicine? Visit your doctor or health care professional for regular checks on your progress. They will need to check the level of testosterone in your blood. This medicine is only approved for use in men who have low levels of testosterone related to certain medical conditions. Heart attacks and strokes have been reported with the use of this medicine. Notify your doctor or health care professional and seek emergency treatment if you develop breathing problems; changes in vision; confusion; chest pain or chest tightness; sudden arm pain; severe, sudden headache; trouble speaking or understanding; sudden numbness or weakness of the face, arm or leg; loss of balance or coordination. Talk to your doctor about the risks and benefits of this medicine. This medicine may affect blood sugar levels. If you have diabetes, check with your doctor or health care professional before you change your diet or the dose of your diabetic medicine. Testosterone injections are not commonly used in women. Women should inform their doctor if they wish to become pregnant or think they might be pregnant. There is a potential for serious side effects to an unborn child. Talk to your health care professional or pharmacist for more information. Talk with your doctor or health care professional about your birth control options while taking this medicine. This drug is banned from use in athletes by most athletic organizations. What side effects may I notice from receiving this medicine? Side effects that you should report to your doctor or health care professional as soon as possible: -allergic reactions like skin rash,   itching or hives, swelling of the face, lips, or tongue -breast enlargement -breathing problems -changes in emotions or moods -deep or  hoarse voice -irregular menstrual periods -signs and symptoms of liver injury like dark yellow or brown urine; general ill feeling or flu-like symptoms; light-colored stools; loss of appetite; nausea; right upper belly pain; unusually weak or tired; yellowing of the eyes or skin -stomach pain -swelling of the ankles, feet, hands -too frequent or persistent erections -trouble passing urine or change in the amount of urine Side effects that usually do not require medical attention (report to your doctor or health care professional if they continue or are bothersome): -acne -change in sex drive or performance -facial hair growth -hair loss -headache This list may not describe all possible side effects. Call your doctor for medical advice about side effects. You may report side effects to FDA at 1-800-FDA-1088. Where should I keep my medicine? Keep out of the reach of children. This medicine can be abused. Keep your medicine in a safe place to protect it from theft. Do not share this medicine with anyone. Selling or giving away this medicine is dangerous and against the law. Store at room temperature between 20 and 25 degrees C (68 and 77 degrees F). Do not freeze. Protect from light. Follow the directions for the product you are prescribed. Throw away any unused medicine after the expiration date. NOTE: This sheet is a summary. It may not cover all possible information. If you have questions about this medicine, talk to your doctor, pharmacist, or health care provider.  2018 Elsevier/Gold Standard (2015-03-16 07:33:55)  

## 2016-06-04 MED ORDER — CEFAZOLIN SODIUM-DEXTROSE 2-4 GM/100ML-% IV SOLN
2.0000 g | INTRAVENOUS | Status: AC
  Administered 2016-06-05: 2 g via INTRAVENOUS
  Filled 2016-06-04: qty 100

## 2016-06-05 ENCOUNTER — Inpatient Hospital Stay (HOSPITAL_COMMUNITY): Payer: Medicare Other | Admitting: Emergency Medicine

## 2016-06-05 ENCOUNTER — Inpatient Hospital Stay (HOSPITAL_COMMUNITY): Payer: Medicare Other

## 2016-06-05 ENCOUNTER — Encounter (HOSPITAL_COMMUNITY)
Admission: RE | Disposition: A | Discharge status: Home Health | Payer: Self-pay | Source: Ambulatory Visit | Attending: Neurosurgery

## 2016-06-05 ENCOUNTER — Inpatient Hospital Stay (HOSPITAL_COMMUNITY): Payer: Medicare Other | Admitting: Vascular Surgery

## 2016-06-05 ENCOUNTER — Inpatient Hospital Stay (HOSPITAL_COMMUNITY)
Admission: RE | Admit: 2016-06-05 | Discharge: 2016-06-09 | DRG: 472 | Disposition: A | Discharge status: Home Health | Payer: Medicare Other | Source: Ambulatory Visit | Attending: Neurosurgery | Admitting: Neurosurgery

## 2016-06-05 ENCOUNTER — Encounter (HOSPITAL_COMMUNITY): Payer: Self-pay

## 2016-06-05 DIAGNOSIS — Z9841 Cataract extraction status, right eye: Secondary | ICD-10-CM

## 2016-06-05 DIAGNOSIS — Z91041 Radiographic dye allergy status: Secondary | ICD-10-CM

## 2016-06-05 DIAGNOSIS — M4802 Spinal stenosis, cervical region: Secondary | ICD-10-CM | POA: Diagnosis present

## 2016-06-05 DIAGNOSIS — I10 Essential (primary) hypertension: Secondary | ICD-10-CM | POA: Diagnosis present

## 2016-06-05 DIAGNOSIS — M4712 Other spondylosis with myelopathy, cervical region: Secondary | ICD-10-CM | POA: Diagnosis present

## 2016-06-05 DIAGNOSIS — E785 Hyperlipidemia, unspecified: Secondary | ICD-10-CM | POA: Diagnosis present

## 2016-06-05 DIAGNOSIS — R531 Weakness: Secondary | ICD-10-CM | POA: Diagnosis present

## 2016-06-05 DIAGNOSIS — Z79891 Long term (current) use of opiate analgesic: Secondary | ICD-10-CM | POA: Diagnosis not present

## 2016-06-05 DIAGNOSIS — K219 Gastro-esophageal reflux disease without esophagitis: Secondary | ICD-10-CM | POA: Diagnosis present

## 2016-06-05 DIAGNOSIS — C9000 Multiple myeloma not having achieved remission: Secondary | ICD-10-CM | POA: Diagnosis present

## 2016-06-05 DIAGNOSIS — Z9842 Cataract extraction status, left eye: Secondary | ICD-10-CM | POA: Diagnosis not present

## 2016-06-05 DIAGNOSIS — M797 Fibromyalgia: Secondary | ICD-10-CM | POA: Diagnosis present

## 2016-06-05 DIAGNOSIS — R29810 Facial weakness: Secondary | ICD-10-CM | POA: Diagnosis present

## 2016-06-05 DIAGNOSIS — Z7984 Long term (current) use of oral hypoglycemic drugs: Secondary | ICD-10-CM

## 2016-06-05 DIAGNOSIS — Z79899 Other long term (current) drug therapy: Secondary | ICD-10-CM | POA: Diagnosis not present

## 2016-06-05 DIAGNOSIS — Z419 Encounter for procedure for purposes other than remedying health state, unspecified: Secondary | ICD-10-CM

## 2016-06-05 DIAGNOSIS — E1142 Type 2 diabetes mellitus with diabetic polyneuropathy: Secondary | ICD-10-CM | POA: Diagnosis present

## 2016-06-05 DIAGNOSIS — Z9221 Personal history of antineoplastic chemotherapy: Secondary | ICD-10-CM

## 2016-06-05 HISTORY — PX: POSTERIOR CERVICAL FUSION/FORAMINOTOMY: SHX5038

## 2016-06-05 LAB — GLUCOSE, CAPILLARY
Glucose-Capillary: 170 mg/dL — ABNORMAL HIGH (ref 65–99)
Glucose-Capillary: 112 mg/dL — ABNORMAL HIGH (ref 65–99)
Glucose-Capillary: 235 mg/dL — ABNORMAL HIGH (ref 65–99)

## 2016-06-05 SURGERY — POSTERIOR CERVICAL FUSION/FORAMINOTOMY LEVEL 3
Anesthesia: General | Site: Neck

## 2016-06-05 MED ORDER — PROMETHAZINE HCL 25 MG/ML IJ SOLN
6.2500 mg | INTRAMUSCULAR | Status: DC | PRN
  Administered 2016-06-05: 6.25 mg via INTRAVENOUS

## 2016-06-05 MED ORDER — CHLORHEXIDINE GLUCONATE CLOTH 2 % EX PADS: 6.0000 | MEDICATED_PAD | Freq: Once | CUTANEOUS | Status: DC

## 2016-06-05 MED ORDER — SUGAMMADEX SODIUM 200 MG/2ML IV SOLN
INTRAVENOUS | Status: DC | PRN
  Administered 2016-06-05: 200 mg via INTRAVENOUS

## 2016-06-05 MED ORDER — PROPOFOL 10 MG/ML IV BOLUS
INTRAVENOUS | Status: AC
  Filled 2016-06-05: qty 20

## 2016-06-05 MED ORDER — PROPOFOL 10 MG/ML IV BOLUS
INTRAVENOUS | Status: DC | PRN
  Administered 2016-06-05: 200 mg via INTRAVENOUS

## 2016-06-05 MED ORDER — BACITRACIN ZINC 500 UNIT/GM EX OINT
TOPICAL_OINTMENT | CUTANEOUS | Status: AC
  Filled 2016-06-05: qty 28.35

## 2016-06-05 MED ORDER — PHENOL 1.4 % MT LIQD
1.0000 | OROMUCOSAL | Status: DC | PRN
Start: 2016-06-05 — End: 2016-06-09

## 2016-06-05 MED ORDER — METOPROLOL SUCCINATE ER 25 MG PO TB24
50.0000 mg | ORAL_TABLET | Freq: Every day | ORAL | Status: DC
  Administered 2016-06-06 – 2016-06-09 (×4): 50 mg via ORAL
  Filled 2016-06-05 (×4): qty 2

## 2016-06-05 MED ORDER — LACTATED RINGERS IV SOLN: INTRAVENOUS | Status: DC | PRN

## 2016-06-05 MED ORDER — ONDANSETRON HCL 4 MG/2ML IJ SOLN
4.0000 mg | Freq: Four times a day (QID) | INTRAMUSCULAR | Status: DC | PRN
  Administered 2016-06-09: 4 mg via INTRAVENOUS
  Filled 2016-06-05: qty 2

## 2016-06-05 MED ORDER — HYDROMORPHONE HCL 1 MG/ML IJ SOLN
INTRAMUSCULAR | Status: AC
  Filled 2016-06-05: qty 1

## 2016-06-05 MED ORDER — FENTANYL CITRATE (PF) 100 MCG/2ML IJ SOLN
100.0000 ug | Freq: Once | INTRAMUSCULAR | Status: AC
  Administered 2016-06-05 (×2): 50 ug via INTRAVENOUS

## 2016-06-05 MED ORDER — METHOCARBAMOL 500 MG PO TABS
500.0000 mg | ORAL_TABLET | Freq: Four times a day (QID) | ORAL | Status: DC
  Administered 2016-06-06 – 2016-06-09 (×13): 500 mg via ORAL
  Filled 2016-06-05 (×13): qty 1

## 2016-06-05 MED ORDER — LIDOCAINE-EPINEPHRINE 1 %-1:100000 IJ SOLN
INTRAMUSCULAR | Status: AC
  Filled 2016-06-05: qty 1

## 2016-06-05 MED ORDER — PANTOPRAZOLE SODIUM 40 MG IV SOLR
40.0000 mg | Freq: Every day | INTRAVENOUS | Status: DC
  Administered 2016-06-05 – 2016-06-08 (×4): 40 mg via INTRAVENOUS
  Filled 2016-06-05 (×4): qty 40

## 2016-06-05 MED ORDER — CEFAZOLIN SODIUM-DEXTROSE 2-4 GM/100ML-% IV SOLN
2.0000 g | Freq: Three times a day (TID) | INTRAVENOUS | Status: AC
  Administered 2016-06-06 (×2): 2 g via INTRAVENOUS
  Filled 2016-06-05 (×2): qty 100

## 2016-06-05 MED ORDER — FUROSEMIDE 40 MG PO TABS
40.0000 mg | ORAL_TABLET | Freq: Every day | ORAL | Status: DC
  Administered 2016-06-06 – 2016-06-09 (×4): 40 mg via ORAL
  Filled 2016-06-05 (×4): qty 1

## 2016-06-05 MED ORDER — THROMBIN 5000 UNITS EX SOLR
OROMUCOSAL | Status: DC | PRN
  Administered 2016-06-05: 5 mL via TOPICAL
  Administered 2016-06-05: 18:00:00 via TOPICAL

## 2016-06-05 MED ORDER — ROCURONIUM BROMIDE 100 MG/10ML IV SOLN
INTRAVENOUS | Status: DC | PRN
  Administered 2016-06-05: 50 mg via INTRAVENOUS
  Administered 2016-06-05: 20 mg via INTRAVENOUS
  Administered 2016-06-05: 10 mg via INTRAVENOUS

## 2016-06-05 MED ORDER — SUCRALFATE 1 GM/10ML PO SUSP
1.0000 g | Freq: Four times a day (QID) | ORAL | Status: DC
  Administered 2016-06-05 – 2016-06-09 (×14): 1 g via ORAL
  Filled 2016-06-05 (×14): qty 10

## 2016-06-05 MED ORDER — AMLODIPINE BESYLATE 10 MG PO TABS
10.0000 mg | ORAL_TABLET | Freq: Every day | ORAL | Status: DC
Start: 2016-06-06 — End: 2016-06-09
  Administered 2016-06-06 – 2016-06-09 (×4): 10 mg via ORAL
  Filled 2016-06-05 (×4): qty 1

## 2016-06-05 MED ORDER — ONDANSETRON HCL 4 MG PO TABS: 4.0000 mg | ORAL_TABLET | Freq: Four times a day (QID) | ORAL | Status: DC | PRN

## 2016-06-05 MED ORDER — BUPIVACAINE HCL (PF) 0.5 % IJ SOLN
INTRAMUSCULAR | Status: DC | PRN
  Administered 2016-06-05: 6.5 mL

## 2016-06-05 MED ORDER — DEXAMETHASONE SODIUM PHOSPHATE 4 MG/ML IJ SOLN
INTRAMUSCULAR | Status: DC | PRN
  Administered 2016-06-05: 10 mg via INTRAVENOUS

## 2016-06-05 MED ORDER — LATANOPROST 0.005 % OP SOLN: 1.0000 [drp] | Freq: Every day | OPHTHALMIC | Status: DC

## 2016-06-05 MED ORDER — SUGAMMADEX SODIUM 200 MG/2ML IV SOLN
INTRAVENOUS | Status: AC
  Filled 2016-06-05: qty 2

## 2016-06-05 MED ORDER — HYDROMORPHONE HCL 1 MG/ML IJ SOLN
0.2500 mg | INTRAMUSCULAR | Status: DC | PRN
  Administered 2016-06-05 (×4): 0.5 mg via INTRAVENOUS

## 2016-06-05 MED ORDER — SENNA 8.6 MG PO TABS
1.0000 | ORAL_TABLET | Freq: Two times a day (BID) | ORAL | Status: DC
  Administered 2016-06-06 – 2016-06-09 (×7): 8.6 mg via ORAL
  Filled 2016-06-05 (×7): qty 1

## 2016-06-05 MED ORDER — LACTATED RINGERS IV SOLN
INTRAVENOUS | Status: DC | PRN
  Administered 2016-06-05 (×2): via INTRAVENOUS

## 2016-06-05 MED ORDER — OXYCODONE HCL 5 MG PO TABS
5.0000 mg | ORAL_TABLET | Freq: Four times a day (QID) | ORAL | Status: DC | PRN
  Administered 2016-06-05 – 2016-06-07 (×4): 5 mg via ORAL
  Filled 2016-06-05 (×4): qty 1

## 2016-06-05 MED ORDER — ROCURONIUM BROMIDE 50 MG/5ML IV SOSY
PREFILLED_SYRINGE | INTRAVENOUS | Status: AC
  Filled 2016-06-05: qty 5

## 2016-06-05 MED ORDER — THROMBIN 5000 UNITS EX SOLR
CUTANEOUS | Status: AC
  Filled 2016-06-05: qty 5000

## 2016-06-05 MED ORDER — OXYCODONE HCL ER 30 MG PO T12A: 30.0000 mg | EXTENDED_RELEASE_TABLET | Freq: Two times a day (BID) | ORAL | Status: DC

## 2016-06-05 MED ORDER — ACETAMINOPHEN 650 MG RE SUPP: 650.0000 mg | RECTAL | Status: DC | PRN

## 2016-06-05 MED ORDER — MIRABEGRON ER 50 MG PO TB24
50.0000 mg | ORAL_TABLET | Freq: Every day | ORAL | Status: DC
  Administered 2016-06-06 – 2016-06-09 (×4): 50 mg via ORAL
  Filled 2016-06-05 (×4): qty 1

## 2016-06-05 MED ORDER — GLIPIZIDE 5 MG PO TABS
5.0000 mg | ORAL_TABLET | Freq: Two times a day (BID) | ORAL | Status: DC
  Administered 2016-06-06 – 2016-06-09 (×7): 5 mg via ORAL
  Filled 2016-06-05 (×7): qty 1

## 2016-06-05 MED ORDER — OXYCODONE-ACETAMINOPHEN 10-325 MG PO TABS
1.0000 | ORAL_TABLET | Freq: Four times a day (QID) | ORAL | Status: DC | PRN
Start: 2016-06-05 — End: 2016-06-05

## 2016-06-05 MED ORDER — LIDOCAINE-EPINEPHRINE 1 %-1:100000 IJ SOLN
INTRAMUSCULAR | Status: DC | PRN
  Administered 2016-06-05: 6.5 mL

## 2016-06-05 MED ORDER — SODIUM CHLORIDE 0.9 % IV SOLN
INTRAVENOUS | Status: DC
  Administered 2016-06-05 – 2016-06-06 (×2): via INTRAVENOUS

## 2016-06-05 MED ORDER — DOCUSATE SODIUM 100 MG PO CAPS
100.0000 mg | ORAL_CAPSULE | Freq: Two times a day (BID) | ORAL | Status: DC
  Administered 2016-06-06 – 2016-06-09 (×7): 100 mg via ORAL
  Filled 2016-06-05 (×7): qty 1

## 2016-06-05 MED ORDER — DEXTROSE 5 % IV SOLN
INTRAVENOUS | Status: DC | PRN
  Administered 2016-06-05: 20 ug/min via INTRAVENOUS

## 2016-06-05 MED ORDER — THROMBIN 20000 UNITS EX SOLR
CUTANEOUS | Status: AC
  Filled 2016-06-05: qty 20000

## 2016-06-05 MED ORDER — ISOSORBIDE MONONITRATE ER 30 MG PO TB24
30.0000 mg | ORAL_TABLET | Freq: Every day | ORAL | Status: DC
  Administered 2016-06-06 – 2016-06-09 (×4): 30 mg via ORAL
  Filled 2016-06-05 (×4): qty 1

## 2016-06-05 MED ORDER — SODIUM CHLORIDE 0.9 % IV SOLN: INTRAVENOUS | Status: DC

## 2016-06-05 MED ORDER — SUCCINYLCHOLINE CHLORIDE 200 MG/10ML IV SOSY
PREFILLED_SYRINGE | INTRAVENOUS | Status: AC
  Filled 2016-06-05: qty 10

## 2016-06-05 MED ORDER — LATANOPROST 0.005 % OP SOLN
1.0000 [drp] | Freq: Every day | OPHTHALMIC | Status: DC
  Administered 2016-06-07 – 2016-06-08 (×3): 1 [drp] via OPHTHALMIC
  Filled 2016-06-05: qty 2.5

## 2016-06-05 MED ORDER — MENTHOL 3 MG MT LOZG: 1.0000 | LOZENGE | OROMUCOSAL | Status: DC | PRN

## 2016-06-05 MED ORDER — POLYVINYL ALCOHOL 1.4 % OP SOLN
1.0000 [drp] | OPHTHALMIC | Status: DC | PRN
  Administered 2016-06-06 (×2): 1 [drp] via OPHTHALMIC
  Filled 2016-06-05: qty 15

## 2016-06-05 MED ORDER — ONDANSETRON HCL 4 MG/2ML IJ SOLN
INTRAMUSCULAR | Status: AC
  Filled 2016-06-05: qty 2

## 2016-06-05 MED ORDER — SODIUM CHLORIDE 0.9% FLUSH: 3.0000 mL | INTRAVENOUS | Status: DC | PRN

## 2016-06-05 MED ORDER — POLYVINYL ALCOHOL-POVIDONE 5-6 MG/ML OP SOLN: 2.0000 [drp] | Freq: Three times a day (TID) | OPHTHALMIC | Status: DC | PRN

## 2016-06-05 MED ORDER — THROMBIN 20000 UNITS EX SOLR
CUTANEOUS | Status: DC | PRN
  Administered 2016-06-05: 18:00:00 via TOPICAL

## 2016-06-05 MED ORDER — HYDROMORPHONE HCL 1 MG/ML IJ SOLN
1.0000 mg | INTRAMUSCULAR | Status: DC | PRN
  Administered 2016-06-05 – 2016-06-09 (×28): 1 mg via INTRAVENOUS
  Filled 2016-06-05 (×29): qty 1

## 2016-06-05 MED ORDER — SODIUM CHLORIDE 0.9 % IR SOLN
Status: DC | PRN
  Administered 2016-06-05: 18:00:00

## 2016-06-05 MED ORDER — SODIUM CHLORIDE 0.9% FLUSH
3.0000 mL | Freq: Two times a day (BID) | INTRAVENOUS | Status: DC
  Administered 2016-06-06 – 2016-06-07 (×3): 3 mL via INTRAVENOUS

## 2016-06-05 MED ORDER — SODIUM CHLORIDE 0.9 % IV SOLN: 250.0000 mL | INTRAVENOUS | Status: DC

## 2016-06-05 MED ORDER — ACETAMINOPHEN 10 MG/ML IV SOLN
INTRAVENOUS | Status: AC
  Filled 2016-06-05: qty 100

## 2016-06-05 MED ORDER — VITAMIN D (ERGOCALCIFEROL) 1.25 MG (50000 UNIT) PO CAPS
50000.0000 [IU] | ORAL_CAPSULE | ORAL | Status: DC
  Administered 2016-06-06: 50000 [IU] via ORAL
  Filled 2016-06-05: qty 1

## 2016-06-05 MED ORDER — ONDANSETRON HCL 4 MG/2ML IJ SOLN
INTRAMUSCULAR | Status: DC | PRN
  Administered 2016-06-05: 4 mg via INTRAVENOUS

## 2016-06-05 MED ORDER — PROMETHAZINE HCL 25 MG/ML IJ SOLN
INTRAMUSCULAR | Status: AC
  Filled 2016-06-05: qty 1

## 2016-06-05 MED ORDER — ACETAMINOPHEN 325 MG PO TABS
650.0000 mg | ORAL_TABLET | ORAL | Status: DC | PRN
  Administered 2016-06-06 – 2016-06-09 (×4): 650 mg via ORAL
  Filled 2016-06-05 (×4): qty 2

## 2016-06-05 MED ORDER — FENTANYL CITRATE (PF) 100 MCG/2ML IJ SOLN
INTRAMUSCULAR | Status: AC
  Administered 2016-06-05: 50 ug via INTRAVENOUS
  Filled 2016-06-05: qty 2

## 2016-06-05 MED ORDER — ACETAMINOPHEN 10 MG/ML IV SOLN
INTRAVENOUS | Status: DC | PRN
  Administered 2016-06-05: 1000 mg via INTRAVENOUS

## 2016-06-05 MED ORDER — FENTANYL CITRATE (PF) 250 MCG/5ML IJ SOLN
INTRAMUSCULAR | Status: AC
  Filled 2016-06-05: qty 5

## 2016-06-05 MED ORDER — FENTANYL CITRATE (PF) 100 MCG/2ML IJ SOLN
INTRAMUSCULAR | Status: DC | PRN
  Administered 2016-06-05 (×2): 50 ug via INTRAVENOUS
  Administered 2016-06-05: 100 ug via INTRAVENOUS

## 2016-06-05 MED ORDER — PANTOPRAZOLE SODIUM 40 MG PO TBEC: 80.0000 mg | DELAYED_RELEASE_TABLET | Freq: Every day | ORAL | Status: DC

## 2016-06-05 MED ORDER — MORPHINE-NALTREXONE 20-0.8 MG PO CPCR: 1.0000 | ORAL_CAPSULE | Freq: Three times a day (TID) | ORAL | Status: DC

## 2016-06-05 MED ORDER — OXYCODONE-ACETAMINOPHEN 5-325 MG PO TABS
1.0000 | ORAL_TABLET | Freq: Four times a day (QID) | ORAL | Status: DC | PRN
  Administered 2016-06-05 – 2016-06-09 (×4): 1 via ORAL
  Filled 2016-06-05 (×4): qty 1

## 2016-06-05 MED ORDER — MIDAZOLAM HCL 2 MG/2ML IJ SOLN
INTRAMUSCULAR | Status: AC
  Filled 2016-06-05: qty 2

## 2016-06-05 MED ORDER — BACITRACIN ZINC 500 UNIT/GM EX OINT
TOPICAL_OINTMENT | CUTANEOUS | Status: DC | PRN
  Administered 2016-06-05: 1 via TOPICAL

## 2016-06-05 MED ORDER — LIDOCAINE 2% (20 MG/ML) 5 ML SYRINGE
INTRAMUSCULAR | Status: DC | PRN
  Administered 2016-06-05: 80 mg via INTRAVENOUS

## 2016-06-05 MED ORDER — ROCURONIUM BROMIDE 50 MG/5ML IV SOSY
PREFILLED_SYRINGE | INTRAVENOUS | Status: AC
  Filled 2016-06-05: qty 10

## 2016-06-05 MED ORDER — LIDOCAINE 2% (20 MG/ML) 5 ML SYRINGE
INTRAMUSCULAR | Status: AC
  Filled 2016-06-05: qty 5

## 2016-06-05 MED ORDER — BISACODYL 10 MG RE SUPP: 10.0000 mg | Freq: Every day | RECTAL | Status: DC | PRN

## 2016-06-05 MED ORDER — ARTIFICIAL TEARS OP OINT
TOPICAL_OINTMENT | OPHTHALMIC | Status: DC | PRN
  Administered 2016-06-05: 1 via OPHTHALMIC

## 2016-06-05 SURGICAL SUPPLY — 72 items
ADH SKN CLS APL DERMABOND .7 (GAUZE/BANDAGES/DRESSINGS) ×1
APL SKNCLS STERI-STRIP NONHPOA (GAUZE/BANDAGES/DRESSINGS) ×2
BAG DECANTER FOR FLEXI CONT (MISCELLANEOUS) ×3 IMPLANT
BASKET BONE COLLECTION (BASKET) ×1 IMPLANT
BENZOIN TINCTURE PRP APPL 2/3 (GAUZE/BANDAGES/DRESSINGS) ×6 IMPLANT
BIT DRILL 2.4X (BIT) IMPLANT
BIT DRL 2.4X (BIT) ×1
BLADE CLIPPER SURG (BLADE) ×3 IMPLANT
BLADE ULTRA TIP 2M (BLADE) IMPLANT
BUR MATCHSTICK NEURO 3.0 LAGG (BURR) ×3 IMPLANT
BUR MATCHSTICK NEURO 3.0X3.8 (BURR) ×2 IMPLANT
BUR PRECISION FLUTE 5.0 (BURR) ×3 IMPLANT
CANISTER SUCT 3000ML PPV (MISCELLANEOUS) ×3 IMPLANT
CARTRIDGE OIL MAESTRO DRILL (MISCELLANEOUS) ×1 IMPLANT
CLOSURE WOUND 1/2 X4 (GAUZE/BANDAGES/DRESSINGS)
DECANTER SPIKE VIAL GLASS SM (MISCELLANEOUS) ×3 IMPLANT
DERMABOND ADVANCED (GAUZE/BANDAGES/DRESSINGS) ×2
DERMABOND ADVANCED .7 DNX12 (GAUZE/BANDAGES/DRESSINGS) IMPLANT
DIFFUSER DRILL AIR PNEUMATIC (MISCELLANEOUS) ×3 IMPLANT
DRAPE C-ARM 42X72 X-RAY (DRAPES) ×6 IMPLANT
DRAPE INCISE 23X17 IOBAN STRL (DRAPES) ×2
DRAPE INCISE 23X17 STRL (DRAPES) IMPLANT
DRAPE INCISE IOBAN 23X17 STRL (DRAPES) ×1 IMPLANT
DRAPE LAPAROTOMY 100X72 PEDS (DRAPES) ×3 IMPLANT
DRAPE POUCH INSTRU U-SHP 10X18 (DRAPES) ×3 IMPLANT
DRILL BIT (BIT) ×3
DRSG OPSITE POSTOP 4X8 (GAUZE/BANDAGES/DRESSINGS) ×2 IMPLANT
DURAPREP 6ML APPLICATOR 50/CS (WOUND CARE) ×3 IMPLANT
ELECT REM PT RETURN 9FT ADLT (ELECTROSURGICAL) ×3
ELECTRODE REM PT RTRN 9FT ADLT (ELECTROSURGICAL) ×1 IMPLANT
GAUZE SPONGE 4X4 12PLY STRL (GAUZE/BANDAGES/DRESSINGS) IMPLANT
GAUZE SPONGE 4X4 16PLY XRAY LF (GAUZE/BANDAGES/DRESSINGS) IMPLANT
GLOVE BIO SURGEON STRL SZ 6.5 (GLOVE) ×1 IMPLANT
GLOVE BIO SURGEON STRL SZ7 (GLOVE) IMPLANT
GLOVE BIO SURGEON STRL SZ7.5 (GLOVE) ×4 IMPLANT
GLOVE BIO SURGEONS STRL SZ 6.5 (GLOVE) ×1
GLOVE BIOGEL PI IND STRL 7.0 (GLOVE) IMPLANT
GLOVE BIOGEL PI IND STRL 7.5 (GLOVE) ×1 IMPLANT
GLOVE BIOGEL PI INDICATOR 7.0 (GLOVE)
GLOVE BIOGEL PI INDICATOR 7.5 (GLOVE) ×8
GLOVE ECLIPSE 7.0 STRL STRAW (GLOVE) ×5 IMPLANT
GLOVE ECLIPSE 9.0 STRL (GLOVE) ×2 IMPLANT
GOWN STRL REUS W/ TWL LRG LVL3 (GOWN DISPOSABLE) IMPLANT
GOWN STRL REUS W/ TWL XL LVL3 (GOWN DISPOSABLE) ×1 IMPLANT
GOWN STRL REUS W/TWL 2XL LVL3 (GOWN DISPOSABLE) IMPLANT
GOWN STRL REUS W/TWL LRG LVL3 (GOWN DISPOSABLE)
GOWN STRL REUS W/TWL XL LVL3 (GOWN DISPOSABLE) ×3
HEMOSTAT POWDER KIT SURGIFOAM (HEMOSTASIS) ×2 IMPLANT
KIT BASIN OR (CUSTOM PROCEDURE TRAY) ×3 IMPLANT
KIT ROOM TURNOVER OR (KITS) ×3 IMPLANT
NDL HYPO 25X1 1.5 SAFETY (NEEDLE) ×1 IMPLANT
NEEDLE HYPO 25X1 1.5 SAFETY (NEEDLE) ×3 IMPLANT
NS IRRIG 1000ML POUR BTL (IV SOLUTION) ×3 IMPLANT
OIL CARTRIDGE MAESTRO DRILL (MISCELLANEOUS) ×3
PACK LAMINECTOMY NEURO (CUSTOM PROCEDURE TRAY) ×3 IMPLANT
PAD ARMBOARD 7.5X6 YLW CONV (MISCELLANEOUS) ×9 IMPLANT
PIN MAYFIELD SKULL DISP (PIN) ×3 IMPLANT
ROD VERTEX 60MM (Rod) ×4 IMPLANT
SCREW CANCELLOUS 3.5X14MM (Screw) ×16 IMPLANT
SCREW SET M6 (Screw) ×16 IMPLANT
SPONGE LAP 4X18 X RAY DECT (DISPOSABLE) IMPLANT
SPONGE SURGIFOAM ABS GEL 100 (HEMOSTASIS) ×3 IMPLANT
STAPLER SKIN PROX WIDE 3.9 (STAPLE) ×3 IMPLANT
STRIP CLOSURE SKIN 1/2X4 (GAUZE/BANDAGES/DRESSINGS) IMPLANT
SUT ETHILON 3 0 FSL (SUTURE) IMPLANT
SUT VIC AB 0 CT1 18XCR BRD8 (SUTURE) ×1 IMPLANT
SUT VIC AB 0 CT1 8-18 (SUTURE) ×9
SUT VIC AB 2-0 CT1 18 (SUTURE) ×3 IMPLANT
SUT VICRYL 3-0 RB1 18 ABS (SUTURE) ×7 IMPLANT
TOWEL GREEN STERILE (TOWEL DISPOSABLE) ×3 IMPLANT
TOWEL GREEN STERILE FF (TOWEL DISPOSABLE) ×2 IMPLANT
WATER STERILE IRR 1000ML POUR (IV SOLUTION) ×3 IMPLANT

## 2016-06-05 NOTE — Anesthesia Preprocedure Evaluation (Addendum)
Anesthesia Evaluation  Patient identified by MRN, date of birth, ID band Patient awake    Reviewed: Allergy & Precautions, NPO status , Patient's Chart, lab work & pertinent test results  Airway Mallampati: II  TM Distance: >3 FB Neck ROM: Limited    Dental no notable dental hx.    Pulmonary neg pulmonary ROS,    Pulmonary exam normal breath sounds clear to auscultation       Cardiovascular hypertension, Pt. on medications and Pt. on home beta blockers Normal cardiovascular exam Rhythm:Regular Rate:Normal     Neuro/Psych negative neurological ROS  negative psych ROS   GI/Hepatic negative GI ROS, Neg liver ROS,   Endo/Other  diabetes  Renal/GU Renal InsufficiencyRenal disease  negative genitourinary   Musculoskeletal negative musculoskeletal ROS (+)   Abdominal   Peds negative pediatric ROS (+)  Hematology  (+) anemia , Multiple myeloma   Anesthesia Other Findings   Reproductive/Obstetrics negative OB ROS                             Anesthesia Physical Anesthesia Plan  ASA: III  Anesthesia Plan: General   Post-op Pain Management:    Induction: Intravenous  Airway Management Planned: Oral ETT and Video Laryngoscope Planned  Additional Equipment:   Intra-op Plan:   Post-operative Plan: Extubation in OR  Informed Consent: I have reviewed the patients History and Physical, chart, labs and discussed the procedure including the risks, benefits and alternatives for the proposed anesthesia with the patient or authorized representative who has indicated his/her understanding and acceptance.   Dental advisory given  Plan Discussed with: CRNA and Surgeon  Anesthesia Plan Comments:        Anesthesia Quick Evaluation

## 2016-06-05 NOTE — Op Note (Signed)
PREOP DIAGNOSIS:  1. 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 C3-6 laminectomy for decompression of spinal cord 2. Posterior segmental instrumentation, C3-C6 lateral mass screws, [employer] 3. Posterolateral fusion, C3-4, C4-5, C5-6, C6-7 4. Use of morcellized bone autograft  SURGEON: Dr. Consuella Lose, MD  ASSISTANT: Dr. Charlie Pitter MD  ANESTHESIA: General Endotracheal  EBL: 300cc  SPECIMENS: None  DRAINS: None  COMPLICATIONS: None immediate  CONDITION: Stable to PACU  HISTORY: [name] is a 66 y.o. male presenting to the outpatient clinic with signs and symptoms of cervical myelopathy. 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 Surgical decompression was therefore indicated. The risks and benefits of the surgery were explained in detail to the patient. After all questions were answered, informed consent was obtained and witnessed.  PROCEDURE IN DETAIL: The patient was brought to the operating room. After induction of general anesthesia, the patient was positioned on the operative table in the [place] head holder in the prone position. All pressure points were meticulously padded. Skin incision was then marked out and prepped and draped in the usual sterile fashion.  After timeout was conducted, skin incision was infiltrated with local anesthetic with epinephrine. Incision was then made sharply and carried down through the subcutaneous tissue. Bovie electrocautery was used to divide the nuchal fascia, and the muscle was divided in the avascular midline plane. The spinous process of C3, C4, C5, and C6 were identified and confirmed with intraoperative fluoroscopy. Subperiosteal dissection was carried out with the Bovie along the lamina of these levels, to the lateral edge of the lateral mass.  Self-retaining retractor was then placed.  At this point, pilot holes for lateral mass screws were drilled and tapped at C3, C4, C5, and C6 bilaterally. Laminectomy was then carried out from C3 down to C6 including the top of C7 using a combination of high-speed drill and Kerrison rongeurs. Bone collected during decompression was saved and morcellized for later use during fusion. Once good decompression of the spinal cord was confirmed, hemostasis on the epidural surface was achieved with morcellized Gelfoam with thrombin.  At this point, the high-speed drill was used to decorticate the lateral masses at C3, C4, C5, and C6 in preparation for posterolateral fusion. 3.5 mm x 14 mm lateral mass screws were then placed bilaterally in C3, C4, C5, and C6. 60 mm rod with pre-bent lordosis was then placed, set screws were placed and final tightened on both sides. The morcellized bone autograft collected during decompression was then placed on the lateral masses bilaterally to facilitate posterior lateral fusion.  The wound was then irrigated with copious amounts of normal saline irrigation. Self-retaining retractor was removed. The wound is closed in multiple layers with a combination of interrupted 0 and 3-0 Vicryl stitches. Skin was closed with Dermabond, and sterile dressing was applied. [place] head holder was then removed, he was transferred to the stretcher, extubated, and taken to the postanesthesia care unit in stable hemodynamic condition. At the end of the case all sponge, needle, instrument, and cottonoid counts were correct.

## 2016-06-05 NOTE — Anesthesia Postprocedure Evaluation (Signed)
Anesthesia Post Note  Patient: Samuel Jester  Procedure(s) Performed: Procedure(s) (LRB): LAMINECTOMY CERVICAL 3 - CERVICAL 6 WITH LATERAL MASS FUSION (N/A)  Patient location during evaluation: PACU Anesthesia Type: General Level of consciousness: awake and alert Pain management: pain level controlled Vital Signs Assessment: post-procedure vital signs reviewed and stable Respiratory status: spontaneous breathing, nonlabored ventilation, respiratory function stable and patient connected to nasal cannula oxygen Cardiovascular status: blood pressure returned to baseline and stable Postop Assessment: no signs of nausea or vomiting Anesthetic complications: no       Last Vitals:  Vitals:   06/05/16 2115 06/05/16 2130  BP: (!) 152/79 (!) 156/74  Pulse: 77 82  Resp: 11 12  Temp:  36.8 C    Last Pain:  Vitals:   06/05/16 2130  TempSrc:   PainSc: Asleep      LLE Sensation: Full sensation (06/05/16 2130)   RLE Sensation: Full sensation (06/05/16 2130)      Makel Mcmann DAVID

## 2016-06-05 NOTE — H&P (Signed)
CC:  Weakness  HPI: [name] is a 66 year old man I am seeing for the above complaints. He comes in  with a primary complaint of left-sided weakness which is slowly been progressing over the course of 2-3 years. He was actually initially referred to [name] for left-sided facial weakness. He apparently says he gets swelling on the left side of his face. During his evaluation, he was found to be myelopathic and MRI of the cervical spine was obtained which did demonstrate fairly severe cervical stenosis. He was therefore referred for neurosurgical evaluation. Upon questioning, he says the weakness in the left arm and leg has been slowly progressing over the last few years. There is not a particular inciting event. He says his balance does feel off, although he has not fallen. He has a longstanding history of peripheral neuropathy from his diabetes. He does note difficulty with grip strength and fine movements in the arms especially on the left side.   Of note, the patient does have a history of multiple myeloma, treated with chemotherapy he says about 5 years ago. He has been in remission since then. He is followed by [name].   PMH: Past Medical History:  Diagnosis Date  . Anemia   . 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 Anemia, iron deficiency 03/12/2011  . Anxiety   . Anxiety disorder   . Arthralgia of multiple joints 12/31/2010  . Arthritis   . Constipation   . Diabetes mellitus without complication ([place])   . Diabetic neuropathy ([place])   . Diverticulosis   . Fibromyalgia   . GERD (gastroesophageal reflux disease)   . Headache   . Hemorrhoids   . Herpes zoster   . Hyperlipidemia   . Hypertension   . Hypotestosteronism 03/17/2012  . Multiple myeloma   . Multiple myeloma ([place])   . Myeloma ([place]) 12/31/2010  . Pneumonia   . Renal insufficiency   . Type 2 diabetes mellitus ([place])   . Urinary retention     PSH: Past Surgical History:  Procedure Laterality Date  . CATARACT  EXTRACTION     bi-lateral  . COLONOSCOPY    . COLONOSCOPY  07/13/2011   Procedure: COLONOSCOPY;  Surgeon: Ladene Artist, MD,FACG;  Location: [facility];  Service: Endoscopy;  Laterality: N/A;  . ESOPHAGOGASTRODUODENOSCOPY    . KNEE ARTHROSCOPY     left  . KNEE SURGERY    . PORTACATH PLACEMENT    . TONSILLECTOMY    . TRANSURETHRAL RESECTION OF PROSTATE  01/2011   Dr. at [facility] in [place]    [place]: Social History  Substance Use Topics  . Smoking status: Never Smoker  . Smokeless tobacco: Never Used     Comment: never used tobacco  . Alcohol use No    MEDS: Prior to Admission medications   Medication Sig Start Date End Date Taking? Authorizing Provider  amLODipine (NORVASC) 10 MG tablet Take 10 mg by mouth daily.   Yes Historical Provider, MD  CARAFATE 1 GM/10ML suspension Take 1 g by mouth 4 (four) times daily.  07/28/11  Yes Historical Provider, MD  furosemide (LASIX) 40 MG tablet Take 40 mg by mouth.   Yes Historical Provider, MD  glipiZIDE (GLUCOTROL) 5 MG tablet Take 5 mg by mouth 2 (two) times daily before a meal.    Yes Historical Provider, MD  isosorbide mononitrate (IMDUR) 30 MG 24 hr tablet Take 30 mg by mouth daily.   Yes Historical Provider, MD  latanoprost Ivin Poot)  0.005 % ophthalmic solution Place 1 drop into the left eye at bedtime.   Yes Historical Provider, MD  methocarbamol (ROBAXIN) 500 MG tablet Take 500 mg by mouth 4 (four) times daily.   Yes Historical Provider, MD  metoprolol succinate (TOPROL XL) 50 MG 24 hr tablet Take 50 mg by mouth. 06/05/15  Yes Historical Provider, MD  mirabegron ER (MYRBETRIQ) 50 MG TB24 tablet Take 50 mg by mouth daily.   Yes Historical Provider, MD  Morphine-Naltrexone (EMBEDA) 20-0.8 MG CPCR Take 1 capsule by mouth 3 (three) times daily.   Yes Historical Provider, MD  omeprazole (PRILOSEC) 40 MG capsule Take 1 capsule (40 mg total) by mouth 2 (two) times daily. 01/01/16  Yes Ladene Artist, MD  oxyCODONE 30 MG 12 hr tablet  Take 1 pill every 8 hours 03/23/16  Yes Adrian Napoleon, MD  oxyCODONE-acetaminophen (PERCOCET) 10-325 MG tablet Take 1 tablet by mouth every 6 (six) hours as needed for pain. 03/23/16  Yes Adrian Napoleon, MD  Polyvinyl Alcohol-Povidone (CLEAR EYES ALL SEASONS) 5-6 MG/ML SOLN Apply 2 drops to eye 3 (three) times daily as needed (drye eyes.).    Yes Historical Provider, MD  Tafluprost (ZIOPTAN) 0.0015 % SOLN Place 1 drop into the right eye daily.   Yes Historical Provider, MD  Vitamin D, Ergocalciferol, (DRISDOL) 50000 UNITS CAPS capsule Take 50,000 Units by mouth every Saturday.    Yes Historical Provider, MD  dicyclomine (BENTYL) 10 MG capsule Take 1 capsule (10 mg total) by mouth 3 (three) times daily. Patient not taking: Reported on 05/22/2016 11/16/14   Adrian Emperor Zehr, PA-C  Linaclotide [facility]) 145 MCG CAPS capsule Take 1 capsule (145 mcg total) by mouth daily before breakfast. Patient not taking: Reported on 05/22/2016 11/16/14   Adrian Emperor Zehr, PA-C  meclizine (ANTIVERT) 50 MG tablet Take 0.5 tablets (25 mg total) by mouth 4 (four) times daily as needed for dizziness. Patient not taking: Reported on 05/22/2016 12/13/11   Adrian Manifold, MD  ondansetron (ZOFRAN ODT) 4 MG disintegrating tablet Take 1 tablet (4 mg total) by mouth every 8 (eight) hours as needed for nausea or vomiting. Patient not taking: Reported on 05/22/2016 09/20/14   Adrian Chou, PA-C  orphenadrine (NORFLEX) 100 MG tablet Take 1 tablet (100 mg total) by mouth 2 (two) times daily. Patient not taking: Reported on 05/28/2016 05/22/16   Adrian Shanks, MD  potassium chloride SA (K-DUR,KLOR-CON) 20 MEQ tablet Take 1 tablet (20 mEq total) by mouth 2 (two) times daily. Patient not taking: Reported on 05/22/2016 11/07/14   Adrian Napoleon, MD  prochlorperazine (COMPAZINE) 10 MG tablet Take 1 tablet (10 mg total) by mouth every 6 (six) hours as needed for nausea or vomiting. Patient not taking: Reported on 05/22/2016 04/18/14   Adrian Napoleon, MD    ALLERGY: Allergies  Allergen Reactions  . Iodine Anxiety and Rash    Didn't feel right "instructed not to take per MD--something with his port"    ROS: ROS  NEUROLOGIC EXAM: Awake, alert, oriented Memory and concentration grossly intact Speech fluent, appropriate CN grossly intact Motor exam: Upper Extremities Deltoid Bicep Tricep Grip  Right 5/5 5/5 5/5 5/5  Left 5/5 5/5 4/5 4/5   Lower Extremity IP Quad PF DF EHL  Right 4+/5 4+/5 5/5 4+/5 4+/5  Left 5/5 5/5 5/5 5/5 5/5   Left hand wasting  IMGAING: MRI of the cervical spine was reviewed. This demonstrates maintenance of some cervical lordosis. There is multilevel  broad-based disc bulge with osteophytic ridging and resultant severe central stenosis at C3-4, C4-5, C5-6, and C6-7. There does appear to be intrinsic T2 signal within the spinal cord at about the C5 level.   IMPRESSION: 66 year old man with severe cervical spondylotic myelopathy which requires decomapression.  PLAN: Proceed with C3-6 laminectomy, lateral mass fusion  I reviewed with the patient the details of the procedure, as well as the risks which include but are not limited to nerve/spinal cord injury leading to arm or leg weakness/numbness/paralysis and/or bowel and bladder dysfunction, CSF leak, bleeding, and infection. Possible outcomes of surgery were also discussed including the possibility of persistence or worsening of pain symptoms and the possible need for additional surgeries. The general risks of anesthesia were also reviewed including heart attack, stroke, and DVT/PE.   The patient understood our discussion and is willing to proceed with surgical decompression and fusion. All questions were answered.

## 2016-06-05 NOTE — Anesthesia Procedure Notes (Addendum)
Procedure Name: Intubation Date/Time: 06/05/2016 5:14 PM Performed by: Trixie Deis A Pre-anesthesia Checklist: Patient identified, Emergency Drugs available, Suction available and Patient being monitored Patient Re-evaluated:Patient Re-evaluated prior to inductionOxygen Delivery Method: Circle System Utilized Preoxygenation: Pre-oxygenation with 100% oxygen Intubation Type: IV induction Ventilation: Mask ventilation without difficulty Laryngoscope Size: Glidescope and 4 Grade View: Grade I Tube type: Oral Tube size: 7.5 mm Number of attempts: 1 Airway Equipment and Method: Stylet and Oral airway Placement Confirmation: ETT inserted through vocal cords under direct vision,  positive ETCO2 and breath sounds checked- equal and bilateral Secured at: 23 cm Tube secured with: Tape Dental Injury: Teeth and Oropharynx as per pre-operative assessment

## 2016-06-05 NOTE — Transfer of Care (Signed)
Immediate Anesthesia Transfer of Care Note  Patient: [name]  Procedure(s) Performed: Procedure(s) with comments: LAMINECTOMY CERVICAL 3 - CERVICAL 6 WITH LATERAL MASS FUSION (N/A) - LAMINECTOMY CERVICAL 3 - CERVICAL 6 WITH LATERAL MASS FUSION  Patient Location: PACU  Anesthesia Type:General  Level of Consciousness: awake, alert , oriented and patient cooperative  Airway & Oxygen Therapy: Patient Spontanous Breathing and Patient connected to nasal cannula oxygen  Post-op Assessment: Report given to RN, Post -op Vital signs reviewed and stable and Patient moving all extremities  Post vital signs: Reviewed and stable  Last Vitals:  Vitals:   06/05/16 1010  BP: (!) 145/70  Pulse: 91  Resp: 18  Temp: 36.9 C    Last Pain:  Vitals:   06/05/16 1041  TempSrc:   PainSc: 10-Worst pain ever      Patients Stated Pain Goal: 4 (96/72/89 7915)  Complications: No apparent anesthesia complications

## 2016-06-06 ENCOUNTER — Encounter (HOSPITAL_COMMUNITY): Payer: Self-pay | Admitting: *Deleted

## 2016-06-06 LAB — GLUCOSE, CAPILLARY
GLUCOSE-CAPILLARY: 193 mg/dL — AB (ref 65–99)
Glucose-Capillary: 299 mg/dL — ABNORMAL HIGH (ref 65–99)
Glucose-Capillary: 251 mg/dL — ABNORMAL HIGH (ref 65–99)
Glucose-Capillary: 195 mg/dL — ABNORMAL HIGH (ref 65–99)
Glucose-Capillary: 170 mg/dL — ABNORMAL HIGH (ref 65–99)

## 2016-06-06 MED ORDER — OXYCODONE HCL ER 20 MG PO T12A
20.0000 mg | EXTENDED_RELEASE_TABLET | Freq: Two times a day (BID) | ORAL | Status: DC
  Administered 2016-06-06 – 2016-06-09 (×7): 20 mg via ORAL
  Filled 2016-06-06 (×7): qty 1

## 2016-06-06 NOTE — Progress Notes (Signed)
Patient resting in bed.  Stated pain level is a "9", but tolerable and can rest now.

## 2016-06-06 NOTE — Progress Notes (Signed)
New orders per Dr. Trenton Gammon, to give oxycontin BID until other orders approved, discontinue oxycontin BID when approval comes through for Morphine, Oxycodone.

## 2016-06-06 NOTE — Progress Notes (Signed)
Patient ID: [name], male   DOB: [birth year], 66 y.o.   MRN: [id number] Subjective: Patient reports he can't postoperative neck pain, no arm pain or numbness tingling or weakness  Objective: Vital signs in last 24 hours: Temp:  [98.1 F (36.7 C)-98.8 F (37.1 C)] 98.8 F (37.1 C) (04/14 0516) Pulse Rate:  [74-95] 92 (04/14 0516) Resp:  [11-18] 18 (04/14 0516) BP: (138-174)/(67-87) 174/70 (04/14 0516) SpO2:  [97 %-100 %] 97 % (04/14 0516) Weight:  [92.1 kg (203 lb)] 92.1 kg (203 lb) (04/13 1041)  Intake/Output from previous day: 04/13 0701 - 04/14 0700 In: 1650 [I.V.:1550; IV Piggyback:100] Out: 1100 [Urine:800; Blood:300] Intake/Output this shift: No intake/output data recorded.  Neurologic: Grossly normal  Lab Results: Lab Results  Component Value Date   WBC 6.2 05/28/2016   HGB 12.6 (L) 05/28/2016   HCT 38.2 (L) 05/28/2016   MCV 74.3 (L) 05/28/2016   PLT 215 05/28/2016   Lab Results  Component Value Date   INR 1.10 11/14/2014   BMET Lab Results  Component Value Date   NA 137 05/28/2016   K 4.0 05/28/2016   CL 106 05/28/2016   CO2 23 05/28/2016   GLUCOSE 118 (H) 05/28/2016   BUN 22 (H) 05/28/2016   CREATININE 2.34 (H) 05/28/2016   CALCIUM 9.0 05/28/2016    Studies/Results: Dg Cervical Spine 2-3 Views  Result Date: 06/05/2016 CLINICAL DATA:  67 y/o M; C3 through C6 lateral mass fusion and laminectomy. EXAM: CERVICAL SPINE - 2-3 VIEW; DG C-ARM 61-120 MIN COMPARISON:  None. FINDINGS: Posterior instrumented fusion of C3 through C6 with rods and lateral mass screws as well as C3 through C6 laminectomy. 6 seconds fluoro time. IMPRESSION: Posterior instrumented fusion and laminectomy fluoroscopy images. Electronically Signed   By: Kristine Garbe M.D.   On: 06/05/2016 21:47   Dg C-arm 1-60 Min  Result Date: 06/05/2016 CLINICAL DATA:  66 y/o M; C3 through C6 lateral mass fusion and laminectomy. EXAM: CERVICAL SPINE - 2-3 VIEW; DG C-ARM 61-120 MIN  COMPARISON:  None. FINDINGS: Posterior instrumented fusion of C3 through C6 with rods and lateral mass screws as well as C3 through C6 laminectomy. 6 seconds fluoro time. IMPRESSION: Posterior instrumented fusion and laminectomy fluoroscopy images. Electronically Signed   By: Kristine Garbe M.D.   On: 06/05/2016 21:47    Assessment/Plan: I think expected neck soreness on postop day 1, continue pain control and mobilization   LOS: 1 day    Nica Friske S 06/06/2016, 8:27 AM

## 2016-06-06 NOTE — Progress Notes (Signed)
Patient complains of pain not relieved by PRN medication.  Call put to MD , with  New orders.  Pharmacy called to verify order for pain medication, still awaiting approval.

## 2016-06-06 NOTE — Progress Notes (Signed)
Patient ambulated in room with walker and stand-by assist.

## 2016-06-06 NOTE — Evaluation (Signed)
Physical Therapy Evaluation Patient Details Name: [name] MRN: [id number] DOB: [birth year] Today's Date: 06/06/2016   History of Present Illness  66 y.o. male s/p cervical laminectomy and fusion.  Clinical Impression  Pt admitted with above diagnosis. Pt currently with functional limitations due to the deficits listed below (see PT Problem List). Mobility significantly limited by pain on eval. When asked to rate his pain on a scale of 10, pt stated it was 200. He was pre-medicated prior to eval but pt feels his pain meds are not effective. He required min assist transfers and min guard assist ambulation with RW 5 feet. Pt will benefit from skilled PT to increase their independence and safety with mobility to allow discharge to the venue listed below.       Follow Up Recommendations No PT follow up;Supervision - Intermittent    Equipment Recommendations  Rolling walker with 5" wheels    Recommendations for Other Services       Precautions / Restrictions Precautions Precautions: Cervical Required Braces or Orthoses: Cervical Brace Cervical Brace: Hard collar;Other (comment) (may remove in bed) Restrictions Other Position/Activity Restrictions: Pt provided with cervical handout.      Mobility  Bed Mobility Overal bed mobility: Needs Assistance Bed Mobility: Sit to Sidelying;Rolling Rolling: Modified independent (Device/Increase time)       Sit to sidelying: Min guard General bed mobility comments: verbal cues for sequencing  Transfers Overall transfer level: Needs assistance Equipment used: Rolling walker (2 wheeled) Transfers: Sit to/from Stand Sit to Stand: Min assist         General transfer comment: verbal cues for hand placement  Ambulation/Gait Ambulation/Gait assistance: Min guard Ambulation Distance (Feet): 5 Feet Assistive device: Rolling walker (2 wheeled) Gait Pattern/deviations: Step-through pattern Gait velocity: mildly decreased Gait velocity  interpretation: Below normal speed for age/gender General Gait Details: steady gait  Stairs            Wheelchair Mobility    Modified Rankin (Stroke Patients Only)       Balance                                             Pertinent Vitals/Pain Pain Assessment: 0-10 Pain Score: 10-Worst pain ever Pain Location: sx site Pain Descriptors / Indicators: Tightness;Aching;Constant Pain Intervention(s): Limited activity within patient's tolerance;Monitored during session;Repositioned;Premedicated before session    [place] expects to be discharged to:: Private residence Living Arrangements: Alone Available Help at Discharge: Family;Available PRN/intermittently;Friend(s) Type of Home: Apartment Home Access: Level entry     Home Layout: One level Home Equipment: None      Prior Function Level of Independence: Independent               Hand Dominance        Extremity/Trunk Assessment   Upper Extremity Assessment Upper Extremity Assessment: Defer to OT evaluation    Lower Extremity Assessment Lower Extremity Assessment: Overall WFL for tasks assessed    Cervical / Trunk Assessment Cervical / Trunk Assessment: Normal  Communication   Communication: No difficulties  Cognition Arousal/Alertness: Awake/alert Behavior During Therapy: Agitated Overall Cognitive Status: Within Functional Limits for tasks assessed                                        General  Comments      Exercises     Assessment/Plan    PT Assessment Patient needs continued PT services  PT Problem List Decreased activity tolerance;Decreased mobility       PT Treatment Interventions Gait training;Stair training;Functional mobility training;Balance training;Therapeutic exercise;Therapeutic activities;Patient/family education    PT Goals (Current goals can be found in the Care Plan section)  Acute Rehab PT Goals Patient Stated  Goal: decrease pain PT Goal Formulation: With patient Time For Goal Achievement: 06/13/16 Potential to Achieve Goals: Good    Frequency Min 5X/week   Barriers to discharge        Co-evaluation               End of Session Equipment Utilized During Treatment: Gait belt;Cervical collar Activity Tolerance: Patient limited by pain Patient left: in bed;with call bell/phone within reach;with family/visitor present Nurse Communication: Mobility status;Other (comment) (pain doesn't think his pain meds are working.) PT Visit Diagnosis: Pain;Other abnormalities of gait and mobility (R26.89)    Time: 9166-0600 PT Time Calculation (min) (ACUTE ONLY): 12 min   Charges:   PT Evaluation $PT Eval Low Complexity: 1 Procedure     PT G Codes:        Lorrin Goodell, PT  Office # [phone] Pager [phone]   Lorriane Shire 06/06/2016, 1:33 PM

## 2016-06-06 NOTE — Progress Notes (Signed)
Patient arrived to 5C04 from PACU. Patient alert and oriented x 4, complaining of 10/10 pain at surgical site. Patient had been medicated for pain in PACU before transfer. Patient's dressing clean, dry and intact. Vitals taken and stable. Neuro intact. Will give PRN pain medication and continue to monitor patient .

## 2016-06-06 NOTE — Progress Notes (Signed)
OT Cancellation Note  Patient Details Name: [name] MRN: [id number] DOB: 02/18/1951   Cancelled Treatment:    Reason Eval/Treat Not Completed: Pain limiting ability to participate;Patient declined. Pt educated on benefits of positional change and he still declined. Spoke with RN before and Pt was pre-medicated. OT to check back as schedule allows for ADL/functional transfer edcuation.   [place] 06/06/2016, 3:04 PM  Hulda Humphrey OTR/L [phone]

## 2016-06-07 LAB — GLUCOSE, CAPILLARY
GLUCOSE-CAPILLARY: 158 mg/dL — AB (ref 65–99)
Glucose-Capillary: 183 mg/dL — ABNORMAL HIGH (ref 65–99)
Glucose-Capillary: 140 mg/dL — ABNORMAL HIGH (ref 65–99)
Glucose-Capillary: 147 mg/dL — ABNORMAL HIGH (ref 65–99)

## 2016-06-07 NOTE — Progress Notes (Signed)
Physical Therapy Treatment Patient Details Name: [name] MRN: [id number] DOB: 1950-08-20 Today's Date: 06/07/2016    History of Present Illness 66 y.o. male s/p cervical laminectomy and fusion.    PT Comments    Pt progressing towards physical therapy goals. Was agreeable to ambulate to/from the bathroom this session however declined further gait training in the hall. "I promise I'll walk tomorrow". Pt was educated on benefits of progressing mobility, however pt continued to decline. Will continue to follow and progress as able per POC.   Follow Up Recommendations  No PT follow up;Supervision - Intermittent     Equipment Recommendations  Rolling walker with 5" wheels    Recommendations for Other Services       Precautions / Restrictions Precautions Precautions: Cervical Precaution Comments: Pt was cued for precautions during functional mobility.  Required Braces or Orthoses: Cervical Brace Cervical Brace: Hard collar;Other (comment) (may remove in bed) Restrictions Weight Bearing Restrictions: No Other Position/Activity Restrictions: Pt provided with cervical handout.    Mobility  Bed Mobility               General bed mobility comments: Pt was received sitting up in recliner  Transfers Overall transfer level: Needs assistance Equipment used: Rolling walker (2 wheeled) Transfers: Sit to/from Stand Sit to Stand: Min assist         General transfer comment: Mild steadying assist as pt powered-up to full standing position. Light assist provided for controlled descent to chair as well.   Ambulation/Gait Ambulation/Gait assistance: Min guard;Supervision Ambulation Distance (Feet): 25 Feet Assistive device: Rolling walker (2 wheeled) Gait Pattern/deviations: Step-through pattern Gait velocity: mildly decreased Gait velocity interpretation: Below normal speed for age/gender General Gait Details: ~25' in room to go to the bathroom, walk to sink for hand  hygiene, and then back to chair. Min guard provided initially with progress to supervision for safety.    Stairs            Wheelchair Mobility    Modified Rankin (Stroke Patients Only)       Balance Overall balance assessment: Needs assistance Sitting-balance support: Feet supported;No upper extremity supported Sitting balance-Leahy Scale: Fair     Standing balance support: No upper extremity supported;During functional activity Standing balance-Leahy Scale: Fair Standing balance comment: Pt was able to stand at sink and wash hands without LOB.                             Cognition Arousal/Alertness: Awake/alert Behavior During Therapy: Flat affect Overall Cognitive Status: Within Functional Limits for tasks assessed                                 General Comments: Pt demonstrated flat affect at begining of session but his interaction and conversation increased as session progressed      Exercises      General Comments General comments (skin integrity, edema, etc.): Pt's cervical collar was positioned incorrectly as back portion was placed upsidedown. PT removed brace and readjusted for proper fit.       Pertinent Vitals/Pain Pain Assessment: Faces Faces Pain Scale: Hurts even more Pain Location: sx site Pain Descriptors / Indicators: Tightness;Aching;Constant Pain Intervention(s): Limited activity within patient's tolerance;Repositioned    Home Living Family/patient expects to be discharged to:: Private residence Living Arrangements: Alone Available Help at Discharge: Family;Available PRN/intermittently;Friend(s) Type of Home: Apartment Home Access: Level  entry   Home Layout: One level Home Equipment: None Additional Comments: Pt will be dcing with his daughter who can provided 24/7 superivsion. Per pt, Daughter lived in single story house with level entery, walk in shower, and standard toilet    Prior Function Level of  Independence: Independent      Comments: was having L side weakness but performing ADLs, IADLs, and occasional driving   PT Goals (current goals can now be found in the care plan section) Acute Rehab PT Goals Patient Stated Goal: decrease pain PT Goal Formulation: With patient Time For Goal Achievement: 06/13/16 Potential to Achieve Goals: Good Progress towards PT goals: Progressing toward goals    Frequency    Min 5X/week      PT Plan Current plan remains appropriate    Co-evaluation             End of Session Equipment Utilized During Treatment: Gait belt;Cervical collar Activity Tolerance: Patient limited by pain Patient left: in chair;with call bell/phone within reach Nurse Communication: Mobility status PT Visit Diagnosis: Pain;Other abnormalities of gait and mobility (R26.89) Pain - part of body:  (Neck)     Time: 6812-7517 PT Time Calculation (min) (ACUTE ONLY): 19 min  Charges:  $Gait Training: 8-22 mins                    G Codes:       Rolinda Roan, PT, DPT Acute Rehabilitation Services Pager: [place] 06/07/2016, 11:35 AM

## 2016-06-07 NOTE — Progress Notes (Signed)
Postoperative 2. Patient's pain better controlled with addition of OxyContin. States his neck and upper extremity pain are improved from preop. Also feels a little stronger in his hands.  Afebrile. Vitals are stable. Awake and alert. Oriented and appropriate. Motor and sensory function stable. Wound clean and dry.  Progressing reasonably well following multilevel posterior cervical decompression and fusion. Continue efforts at therapy and rehabilitation. Probable discharge early this week.

## 2016-06-07 NOTE — Evaluation (Signed)
Occupational Therapy Evaluation Patient Details Name: [name] MRN: [id number] DOB: April 28, 1950 Today's Date: 06/07/2016    History of Present Illness 66 y.o. male s/p cervical laminectomy and fusion.   Clinical Impression   PTA, pt lived alone and was independent in ADLs and IADLs. Currently, pt is limited by pain and requires Min A for grooming and self feeding while seated and Max A for LB ADLs. Provided education on cervical precautions, donning/doffing and cleaning cervical collar, and cervical hand out. Pt reports that he plans to go home with his daughter. Pt would benefit from continued skilled OT to increase his independence in ADLs and functional mobility. Recommend dc home with HHOT to increase pt's safety and return to PLOF.     Follow Up Recommendations  Home health OT;Supervision/Assistance - 24 hour    Equipment Recommendations  3 in 1 bedside commode    Recommendations for Other Services PT consult     Precautions / Restrictions Precautions Precautions: Cervical Precaution Comments: Reviewed the cervical precautions and provided handout Required Braces or Orthoses: Cervical Brace Cervical Brace: Hard collar;Other (comment) (may remove in bed) Restrictions Weight Bearing Restrictions: No Other Position/Activity Restrictions: Pt provided with cervical handout.      Mobility Bed Mobility               General bed mobility comments: in recliner upon arrival  Transfers                 General transfer comment: Declined to stand    Balance                                           ADL either performed or assessed with clinical judgement   ADL Overall ADL's : Needs assistance/impaired Eating/Feeding: Minimal assistance;Sitting Eating/Feeding Details (indicate cue type and reason): Cut pt's food into strips so he could self feed with fingers. 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 However, only able to tolerance ~5 min due to pain.  (Declined to stand for ADLs ) Upper Body Bathing: Maximal assistance;Sitting   Lower Body Bathing: Maximal assistance;Sit to/from stand   Upper Body Dressing : Moderate assistance;Sitting   Lower Body Dressing: Maximal assistance;Sit to/from stand Lower Body Dressing Details (indicate cue type and reason): Would benefit from AE for LB ADLs    Toilet Transfer Details (indicate cue type and reason): Pt reports that he has been walking to bathroom for toileting and using the standard toilet       Tub/Shower Transfer Details (indicate cue type and reason): Pt would benefit from 3N1 Functional mobility during ADLs:  (Declined to stand for session) General ADL Comments: Educated pt on donning/doffing collar and how to clean it.      Vision         Perception     Praxis      Pertinent Vitals/Pain Pain Assessment: Faces Faces Pain Scale: Hurts even more Pain Location: sx site Pain Descriptors / Indicators: Tightness;Aching;Constant Pain Intervention(s): Limited activity within patient's tolerance     Hand Dominance Right   Extremity/Trunk Assessment Upper Extremity Assessment Upper Extremity Assessment: RUE deficits/detail;LUE deficits/detail RUE Deficits / Details: Pt has decreased grasp strength and shoulder, elbow, and wrist AROM (  R side stronger than left side) RUE: Unable to fully assess due to pain RUE Sensation:  (Intact) RUE Coordination: decreased gross motor;decreased fine motor (Pt performed finger opposition. However, had difficulty removing toothpaste cap) LUE Deficits / Details: Pt has decreased grasp strength and shoulder, elbow, and wrist AROM LUE: Unable to fully assess due to pain LUE Sensation:  (Intact) LUE Coordination: decreased  gross motor;decreased fine motor (Difficulty squeezing toothpaste and needed Min A)   Lower Extremity Assessment Lower Extremity Assessment: Defer to PT evaluation   Cervical / Trunk Assessment Cervical / Trunk Assessment: Normal   Communication Communication Communication: No difficulties   Cognition Arousal/Alertness: Awake/alert Behavior During Therapy: Flat affect Overall Cognitive Status: Within Functional Limits for tasks assessed                                 General Comments: Pt demonstrated flat affect at begining of session but his interaction and conversation increased as session progressed   General Comments  Pt reports that he is alot of pain    Exercises     Shoulder Instructions      Home Living Family/patient expects to be discharged to:: Private residence Living Arrangements: Alone Available Help at Discharge: Family;Available PRN/intermittently;Friend(s) Type of Home: Apartment Home Access: Level entry     Home Layout: One level     Bathroom Shower/Tub: Teacher, early years/pre: Standard     Home Equipment: None   Additional Comments: Pt will be dcing with his daughter who can provided 24/7 superivsion. Per pt, Daughter lived in single story house with level entery, walk in shower, and standard toilet      Prior Functioning/Environment Level of Independence: Independent        Comments: was having L side weakness but performing ADLs, IADLs, and occasional driving        OT Problem List: Decreased strength;Decreased range of motion;Decreased activity tolerance;Impaired balance (sitting and/or standing);Decreased safety awareness;Decreased knowledge of use of DME or AE;Pain;Impaired UE functional use      OT Treatment/Interventions: Self-care/ADL training;Therapeutic exercise;Energy conservation;DME and/or AE instruction;Therapeutic activities;Patient/family education    OT Goals(Current goals can be found in the  care plan section) Acute Rehab OT Goals Patient Stated Goal: decrease pain OT Goal Formulation: With patient Time For Goal Achievement: 06/21/16 Potential to Achieve Goals: Good ADL Goals Pt Will Perform Grooming: with supervision;with set-up;standing Pt Will Perform Lower Body Bathing: with adaptive equipment;with set-up;with supervision;sit to/from stand Pt Will Perform Upper Body Dressing: with set-up;with supervision;sitting Pt Will Perform Lower Body Dressing: with set-up;with supervision;sit to/from stand Pt Will Transfer to Toilet: with set-up;with supervision;bedside commode;ambulating Pt Will Perform Tub/Shower Transfer: Shower transfer;3 in 1;with min guard assist;rolling walker;ambulating  OT Frequency: Min 3X/week   Barriers to D/C:            Co-evaluation              End of Session Equipment Utilized During Treatment: Cervical collar Nurse Communication: Mobility status;Other (comment) (Nausea with certain foods and possibly need for A to order)  Activity Tolerance: Patient limited by pain Patient left: in chair;with call bell/phone within reach  OT Visit Diagnosis: Unsteadiness on feet (R26.81);Muscle weakness (generalized) (M62.81);Pain Pain - Right/Left: Left Pain - part of body: Shoulder (Back)                Time: 8469-6295 OT Time Calculation (min): 27 min Charges:  OT General  Charges $OT Visit: 1 Procedure OT Evaluation $OT Eval Moderate Complexity: 1 Procedure OT Treatments $Self Care/Home Management : 8-22 mins G-Codes:     [employer], OTR/L [phone]  [place] Footman Siera [place] 06/07/2016, 10:15 AM

## 2016-06-08 LAB — GLUCOSE, CAPILLARY
GLUCOSE-CAPILLARY: 158 mg/dL — AB (ref 65–99)
GLUCOSE-CAPILLARY: 163 mg/dL — AB (ref 65–99)
GLUCOSE-CAPILLARY: 168 mg/dL — AB (ref 65–99)
Glucose-Capillary: 161 mg/dL — ABNORMAL HIGH (ref 65–99)

## 2016-06-08 MED ORDER — LIDOCAINE HCL 2 % EX GEL
1.0000 "application " | Freq: Once | CUTANEOUS | Status: AC
  Administered 2016-06-08: 1 via URETHRAL
  Filled 2016-06-08: qty 5

## 2016-06-08 MED FILL — Thrombin For Soln 5000 Unit: CUTANEOUS | Qty: 5000 | Status: AC

## 2016-06-08 NOTE — Care Management Note (Signed)
Case Management Note  Patient Details  Name: [name] MRN: [id number] Date of Birth: March 03, 1950  Subjective/Objective:    Pt s/p cervical fusion. He is from home alone.                Action/Plan: No f/u per PT. OT recommending [facility] services. CM following for d/c needs, physician orders.   Expected Discharge Date:                  Expected Discharge Plan:  Home/Self Care  In-House Referral:     Discharge planning Services     Post Acute Care Choice:    Choice offered to:     DME Arranged:    DME Agency:     HH Arranged:    HH Agency:     Status of Service:  In process, will continue to follow  If discussed at Long Length of Stay Meetings, dates discussed:    Additional Comments:  Pollie Friar, RN 06/08/2016, 10:23 AM

## 2016-06-08 NOTE — Progress Notes (Signed)
No issues overnight. Reports neck soreness. Neck pain and arm pain improved from preop.  EXAM:  BP (!) 156/80 (BP Location: Right Arm)   Pulse 97   Temp 99.8 F (37.7 C) (Oral)   Resp 18   Ht 5\' 11"  (1.803 m)   Wt 92.1 kg (203 lb)   SpO2 98%   BMI 28.31 kg/m   Awake, alert, oriented  Speech fluent, appropriate  CN grossly intact  5/5 RUE/RLE Minimal Left-sided weakness at baseline  IMPRESSION:  66 y.o. male POD#3 s/p C3-6 laminectomy/fusion, doing well  PLAN: - Cont to mobilize - plan on d/c likely tomorrow

## 2016-06-08 NOTE — Progress Notes (Signed)
Occupational Therapy Treatment Patient Details Name: [name] MRN: [id number] DOB: 11/12/1950 Today's Date: 06/08/2016    History of present illness 66 y.o. male s/p cervical laminectomy and fusion.   OT comments  Pt progressing towards goals and demonstrating increase functional performance. Educated pt on LB ADLs with AE. Pt demonstrated understanding by donning underwear with Set up and then Min guard for sit<>stand transfer. Will continue to follow acutely to facilitate safe dc home.    Follow Up Recommendations  Home health OT;Supervision/Assistance - 24 hour    Equipment Recommendations  3 in 1 bedside commode    Recommendations for Other Services PT consult    Precautions / Restrictions Precautions Precautions: Cervical Precaution Comments: Reviewed cervical precautions durign ADLs Required Braces or Orthoses: Cervical Brace Cervical Brace: Hard collar;Other (comment) (May remove in bed) Restrictions Weight Bearing Restrictions: No       Mobility Bed Mobility               General bed mobility comments: In recliner upon arrival  Transfers Overall transfer level: Needs assistance Equipment used: Rolling walker (2 wheeled) Transfers: Sit to/from Stand Sit to Stand: Min guard         General transfer comment: Slow to ascend, cues for hand placement and forward weight shifting.      Balance Overall balance assessment: Needs assistance Sitting-balance support: Feet supported;Bilateral upper extremity supported Sitting balance-Leahy Scale: Fair     Standing balance support: No upper extremity supported;During functional activity Standing balance-Leahy Scale: Fair Standing balance comment: Able to maintain static standing at toilet                            ADL either performed or assessed with clinical judgement   ADL Overall ADL's : Needs assistance/impaired             Lower Body Bathing: Minimal assistance;Sit to/from  stand;With adaptive equipment Lower Body Bathing Details (indicate cue type and reason): Educated pt on use of AE for LB bathing. Verbalized understanding     Lower Body Dressing: Set up;Supervision/safety;Sit to/from stand;Cueing for sequencing;With adaptive equipment Lower Body Dressing Details (indicate cue type and reason): Pt demonstrated understanding of AE for LB dressing as seen by donning underwear with set up and supervision. Issued reacher. Pt states he owns sock aide           Tub/Shower Transfer Details (indicate cue type and reason): discussed with pt use of 3N1 for walk in shower. Pt verbalized understanding. would benefit from practice and training Functional mobility during ADLs: Min guard;Rolling walker General ADL Comments: Pt demonstrated understanding of use for AE.     Vision       Perception     Praxis      Cognition Arousal/Alertness: Awake/alert Behavior During Therapy: Flat affect Overall Cognitive Status: Within Functional Limits for tasks assessed                                 General Comments: Pt did not participate in as mcuh conversation during this session as previous        Exercises     Shoulder Instructions       General Comments      Pertinent Vitals/ Pain       Pain Assessment: Faces Faces Pain Scale: Hurts even more Pain Location: cervical spine Pain Descriptors / Indicators: Tightness;Aching;Constant;Guarding;Grimacing;Moaning Pain  Intervention(s): Monitored during session  Home Living                                          Prior Functioning/Environment              Frequency  Min 3X/week        Progress Toward Goals  OT Goals(current goals can now be found in the care plan section)     Acute Rehab OT Goals Patient Stated Goal: decrease pain OT Goal Formulation: With patient Time For Goal Achievement: 06/21/16 Potential to Achieve Goals: Good ADL Goals Pt Will Perform  Grooming: with supervision;with set-up;standing Pt Will Perform Lower Body Bathing: with adaptive equipment;with set-up;with supervision;sit to/from stand Pt Will Perform Upper Body Dressing: with set-up;with supervision;sitting Pt Will Perform Lower Body Dressing: with set-up;with supervision;sit to/from stand Pt Will Transfer to Toilet: with set-up;with supervision;bedside commode;ambulating Pt Will Perform Tub/Shower Transfer: Shower transfer;3 in 1;with min guard assist;rolling walker;ambulating  Plan Discharge plan remains appropriate    Co-evaluation                 End of Session Equipment Utilized During Treatment: Cervical collar  OT Visit Diagnosis: Unsteadiness on feet (R26.81);Muscle weakness (generalized) (M62.81);Pain Pain - Right/Left: Left Pain - part of body: Shoulder   Activity Tolerance Patient limited by pain   Patient Left in chair;with call bell/phone within reach   Nurse Communication Mobility status        Time: 3094-0768 OT Time Calculation (min): 19 min  Charges: OT General Charges $OT Visit: 1 Procedure OT Treatments $Self Care/Home Management : 8-22 mins  [place], OTR/L [place] 06/08/2016, 5:25 PM

## 2016-06-08 NOTE — Clinical Social Work Note (Signed)
CSW acknowledges SNF consult. PT recommended no PT follow up. Per PT eval, patient will discharge home with his daughter who can provide 24/7 supervision.  CSW signing off. Consult again if any other social work needs arise.  [name], [place]

## 2016-06-08 NOTE — Progress Notes (Signed)
Physical Therapy Treatment Patient Details Name: [name] MRN: [id number] DOB: 1950-05-06 Today's Date: 06/08/2016    History of Present Illness 66 y.o. male s/p cervical laminectomy and fusion.    PT Comments    Pt performed increased gait with max VCs for encouragement.  Pt remains hesitant to mobilize and required education from PTA and MD before treatment.  Pt limits progression.  Pt educated on the benefits of mobility and the risks of not mobilizing. Will continue PT per POC.     Follow Up Recommendations  No PT follow up;Supervision - Intermittent     Equipment Recommendations  Rolling walker with 5" wheels    Recommendations for Other Services       Precautions / Restrictions Precautions Precautions: Cervical Precaution Comments: Pt was cued for precautions during functional mobility.  Required Braces or Orthoses: Cervical Brace Cervical Brace: Hard collar;Other (comment) (May remove Hard collar in bed.  ) Restrictions Weight Bearing Restrictions: No Other Position/Activity Restrictions: Pt provided with cervical handout.    Mobility  Bed Mobility Overal bed mobility: Needs Assistance Bed Mobility: Sit to Sidelying;Rolling Rolling: Min guard       Sit to sidelying: Min assist General bed mobility comments: Cues for hand placement and technique, required min assist to elevate trunk into sitting.    Transfers Overall transfer level: Needs assistance Equipment used: Rolling walker (2 wheeled) Transfers: Sit to/from Stand Sit to Stand: Min guard         General transfer comment: Slow to ascend, cues for hand placement and forward weight shifting.    Ambulation/Gait Ambulation/Gait assistance: Min guard Ambulation Distance (Feet): 85 Feet Assistive device: Rolling walker (2 wheeled) Gait Pattern/deviations: Step-through pattern;Decreased step length - left;Shuffle;Trunk flexed Gait velocity: mildly decreased Gait velocity interpretation: Below  normal speed for age/gender General Gait Details: Cues for increased foot clearance and stride on L.  Cues for progressing gait distance.     Stairs            Wheelchair Mobility    Modified Rankin (Stroke Patients Only)       Balance Overall balance assessment: Needs assistance Sitting-balance support: Feet supported;Bilateral upper extremity supported Sitting balance-Leahy Scale: Fair       Standing balance-Leahy Scale: Fair Standing balance comment: relied on RW for support in standing.                              Cognition Arousal/Alertness: Awake/alert Behavior During Therapy: Flat affect Overall Cognitive Status: Within Functional Limits for tasks assessed                                 General Comments: Pt demonstrated flat affect at begining of session but his interaction and conversation increased as session progressed      Exercises      General Comments        Pertinent Vitals/Pain Pain Assessment: Faces Faces Pain Scale: Hurts even more Pain Location: cervical spine Pain Descriptors / Indicators: Tightness;Aching;Constant;Guarding;Grimacing;Moaning Pain Intervention(s): Limited activity within patient's tolerance;Repositioned    Home Living                      Prior Function            PT Goals (current goals can now be found in the care plan section) Acute Rehab PT Goals Patient Stated Goal: decrease  pain Potential to Achieve Goals: Good Progress towards PT goals: Progressing toward goals    Frequency    Min 5X/week      PT Plan Current plan remains appropriate    Co-evaluation             End of Session Equipment Utilized During Treatment: Gait belt;Cervical collar Activity Tolerance: Patient limited by pain Patient left: in chair;with call bell/phone within reach Nurse Communication: Mobility status PT Visit Diagnosis: Pain;Other abnormalities of gait and mobility (R26.89) Pain  - part of body:  (Neck)     Time: 1660-6004 PT Time Calculation (min) (ACUTE ONLY): 26 min  Charges:  $Gait Training: 8-22 mins $Therapeutic Activity: 8-22 mins                    G Codes:       Governor Rooks, PTA pager [phone]    Cristela Blue 06/08/2016, 9:10 AM

## 2016-06-09 LAB — GLUCOSE, CAPILLARY: Glucose-Capillary: 190 mg/dL — ABNORMAL HIGH (ref 65–99)

## 2016-06-09 MED ORDER — OXYCODONE HCL ER 20 MG PO T12A: 20.0000 mg | EXTENDED_RELEASE_TABLET | Freq: Two times a day (BID) | ORAL | 0 refills | Status: DC

## 2016-06-09 MED ORDER — OXYCODONE-ACETAMINOPHEN 10-325 MG PO TABS: 1.0000 | ORAL_TABLET | Freq: Four times a day (QID) | ORAL | 0 refills | Status: DC | PRN

## 2016-06-09 NOTE — Progress Notes (Signed)
PT Cancellation Note  Patient Details Name: [name] MRN: [id number] DOB: Sep 01, 1950   Cancelled Treatment:    Reason Eval/Treat Not Completed: Patient declined, no reason specified. Pt declining to work with PT today. States he is going home today. Max encouragement provided however pt continued to refuse. Will continue to follow. Please page this therapist if pt decides he wants to work with therapy prior to d/c today.    Thelma Comp 06/09/2016, 1:13 PM   Rolinda Roan, PT, DPT Acute Rehabilitation Services Pager: [phone]

## 2016-06-09 NOTE — Care Management Important Message (Signed)
Important Message  Patient Details  Name: [name] MRN: [id number] Date of Birth: 1950/03/03   Medicare Important Message Given:  Yes    Orbie Pyo 06/09/2016, 2:27 PM

## 2016-06-09 NOTE — Progress Notes (Signed)
Patient discharged home with family. Discharge information given. Prescription for prn pain medication given. Patient questions asked and answered. IV discontinued. Patient will discharge with indwelling catheter and follow up with established urologist. Home equipment sent to vehicle with patient's family.  Patient will transport from unit via wheelchair and volunteer services. Wendee Copp

## 2016-06-09 NOTE — Discharge Summary (Signed)
Physician Discharge Summary  Patient ID: [name] MRN: [id number] DOB/AGE: 67/04/1950 67 y.o.  Admit date: 06/05/2016 Discharge date: 06/09/2016  Admission Diagnoses:  Cervical spondylosis with myelopathy  Discharge Diagnoses:  Same Active Problems:   Cervical spondylosis with myelopathy   Discharged Condition: Stable  Hospital Course:  [name] is a 66 y.o. male electively admitted after uncomplicated Z6-0 laminectomy/fusion. He was at baseline postop, reporting improvement in pain and some improvement in strength. He was tolerating diet, pain was controlled, and he was seen by PT/OT and was safe for d/c home.  Treatments: Surgery - C3-6 laminectomy/lateral mass fusion  Discharge Exam: Blood pressure 137/82, pulse 89, temperature 98.4 F (36.9 C), temperature source Oral, resp. rate 16, height 5\' 11"  (1.803 m), weight 92.1 kg (203 lb), SpO2 97 %. Awake, alert, oriented Speech fluent, appropriate CN grossly intact 5/5 right Mild 4-4+/5 left hemiparesis Wound c/d/i  Disposition: 01-Home or Self Care  Discharge Instructions    Call MD for:  redness, tenderness, or signs of infection (pain, swelling, redness, odor or green/yellow discharge around incision site)    Complete by:  As directed    Call MD for:  temperature >100.4    Complete by:  As directed    Diet - low sodium heart healthy    Complete by:  As directed    Discharge instructions    Complete by:  As directed    Walk at home as much as possible, at least 4 times / day   Increase activity slowly    Complete by:  As directed    Lifting restrictions    Complete by:  As directed    No lifting > 10 lbs   May shower / Bathe    Complete by:  As directed    48 hours after surgery   May walk up steps    Complete by:  As directed    No dressing needed    Complete by:  As directed    Other Restrictions    Complete by:  As directed    No bending/twisting at waist     Allergies as of 06/09/2016       Reactions   Iodine Anxiety, Rash   Didn't feel right "instructed not to take per MD--something with his port"      Medication List    TAKE these medications   amLODipine 10 MG tablet Commonly known as:  NORVASC Take 10 mg by mouth daily.   CARAFATE 1 GM/10ML suspension Generic drug:  sucralfate Take 1 g by mouth 4 (four) times daily.   CLEAR EYES ALL SEASONS 5-6 MG/ML Soln Generic drug:  Polyvinyl Alcohol-Povidone Place 2 drops into both eyes 3 (three) times daily as needed (drye eyes.).   dicyclomine 10 MG capsule Commonly known as:  BENTYL Take 1 capsule (10 mg total) by mouth 3 (three) times daily.   EMBEDA 20-0.8 MG Cpcr Generic drug:  Morphine-Naltrexone Take 1 capsule by mouth 3 (three) times daily.   glipiZIDE 5 MG tablet Commonly known as:  GLUCOTROL Take 5 mg by mouth 2 (two) times daily before a meal.   isosorbide mononitrate 30 MG 24 hr tablet Commonly known as:  IMDUR Take 30 mg by mouth daily.   LASIX 40 MG tablet Generic drug:  furosemide Take 40 mg by mouth.   latanoprost 0.005 % ophthalmic solution Commonly known as:  XALATAN Place 1 drop into the right eye every evening.   linaclotide 145 MCG Caps capsule Commonly  known as:  LINZESS Take 1 capsule (145 mcg total) by mouth daily before breakfast.   meclizine 50 MG tablet Commonly known as:  ANTIVERT Take 0.5 tablets (25 mg total) by mouth 4 (four) times daily as needed for dizziness.   methocarbamol 500 MG tablet Commonly known as:  ROBAXIN Take 500 mg by mouth 4 (four) times daily.   mirabegron ER 50 MG Tb24 tablet Commonly known as:  MYRBETRIQ Take 50 mg by mouth daily.   omeprazole 40 MG capsule Commonly known as:  PRILOSEC Take 1 capsule (40 mg total) by mouth 2 (two) times daily.   ondansetron 4 MG disintegrating tablet Commonly known as:  ZOFRAN ODT Take 1 tablet (4 mg total) by mouth every 8 (eight) hours as needed for nausea or vomiting.   orphenadrine 100 MG  tablet Commonly known as:  NORFLEX Take 1 tablet (100 mg total) by mouth 2 (two) times daily.   oxyCODONE 20 mg 12 hr tablet Commonly known as:  OXYCONTIN Take 1 tablet (20 mg total) by mouth every 12 (twelve) hours. What changed:  when to take this   oxyCODONE-acetaminophen 10-325 MG tablet Commonly known as:  PERCOCET Take 1 tablet by mouth every 6 (six) hours as needed for pain. What changed:  when to take this   potassium chloride SA 20 MEQ tablet Commonly known as:  K-DUR,KLOR-CON Take 1 tablet (20 mEq total) by mouth 2 (two) times daily.   prochlorperazine 10 MG tablet Commonly known as:  COMPAZINE Take 1 tablet (10 mg total) by mouth every 6 (six) hours as needed for nausea or vomiting.   TOPROL XL 50 MG 24 hr tablet Generic drug:  metoprolol succinate Take 50 mg by mouth.   Vitamin D (Ergocalciferol) 50000 units Caps capsule Commonly known as:  DRISDOL Take 50,000 Units by mouth every Saturday.   ZIOPTAN 0.0015 % Soln Generic drug:  Tafluprost Place 1 drop into both eyes daily.      Follow-up Information    Maisley Hainsworth, C, MD Follow up in 2 week(s).   Specialty:  Neurosurgery Contact information: [address] [place] [address] [phone]           Signed: Consuella Lose, Loletha Grayer 06/09/2016, 10:21 AM

## 2016-06-09 NOTE — Plan of Care (Signed)
Problem: Activity: Goal: Ability to avoid complications of mobility impairment will improve Outcome: Progressing Patient refused to sit in chair as recommended, was encouraged to ambulate and agreed. Patient ambulated about 40ft to and from room. Goal: Will remain free from falls Outcome: Progressing Patient remains free of falls this shift. Will continue to monitor  Problem: Bowel/Gastric: Goal: Gastrointestinal status for postoperative course will improve Patient last B/M is 4/13. Abdomen is soft and non-distended. Bowl sounds active. Will continue to monitor  Problem: Pain Management: Goal: Pain level will decrease Outcome: Progressing Patient experienced pain with ambulation. Patient has gotten pain relief with PO pain meds this shift. No IV pain med given this far will continue to monitor  Problem: Bladder/Genitourinary: Goal: Urinary functional status for postoperative course will improve Outcome: Not Progressing Patient unable to void today. Straight cathed on am. Foley inserted this evening. Will continue to monitor.   Comments: Patient unable to void today. Treatment team contacted. New order given for foley. Foley Cath in place. Will continue care and update on coming nurse.

## 2016-06-09 NOTE — Care Management Note (Signed)
Case Management Note  Patient Details  Name: [name] MRN: [id number] Date of Birth: [birth year]  Subjective/Objective:                    Action/Plan: Pt discharging to his daughter, Bayard Males, house today with self care. Pt states that Denman George is able to provide care and supervision. Pt with orders for rolling walker and 3 in 1. Santiago Glad with [facility] DME notified and will deliver the equipment to the room.  Pt has transportation home.   Expected Discharge Date:  06/09/16               Expected Discharge Plan:  Home/Self Care  In-House Referral:     Discharge planning Services  CM Consult  Post Acute Care Choice:  Durable Medical Equipment Choice offered to:  Patient  DME Arranged:  3-N-1, Walker rolling DME Agency:  [place]:    [place]:     Status of Service:  Completed, signed off  If discussed at [place] of Stay Meetings, dates discussed:    Additional Comments:  Pollie Friar, RN 06/09/2016, 10:37 AM

## 2016-06-10 ENCOUNTER — Encounter (HOSPITAL_COMMUNITY): Payer: Self-pay | Admitting: Neurosurgery

## 2016-06-12 ENCOUNTER — Other Ambulatory Visit: Payer: Self-pay | Admitting: *Deleted

## 2016-06-12 DIAGNOSIS — C9000 Multiple myeloma not having achieved remission: Secondary | ICD-10-CM

## 2016-06-12 DIAGNOSIS — D508 Other iron deficiency anemias: Secondary | ICD-10-CM

## 2016-06-15 ENCOUNTER — Emergency Department (HOSPITAL_COMMUNITY)
Admission: EM | Admit: 2016-06-15 | Discharge: 2016-06-16 | Disposition: A | Discharge status: Home / Self Care | Payer: Medicare Other | Attending: Emergency Medicine | Admitting: Emergency Medicine

## 2016-06-15 ENCOUNTER — Other Ambulatory Visit: Payer: Medicare Other

## 2016-06-15 ENCOUNTER — Emergency Department (HOSPITAL_COMMUNITY): Payer: Medicare Other

## 2016-06-15 ENCOUNTER — Encounter (HOSPITAL_COMMUNITY): Payer: Self-pay | Admitting: Emergency Medicine

## 2016-06-15 ENCOUNTER — Ambulatory Visit: Payer: Medicare Other

## 2016-06-15 DIAGNOSIS — R531 Weakness: Secondary | ICD-10-CM | POA: Diagnosis not present

## 2016-06-15 DIAGNOSIS — Z79899 Other long term (current) drug therapy: Secondary | ICD-10-CM | POA: Diagnosis not present

## 2016-06-15 DIAGNOSIS — Z8579 Personal history of other malignant neoplasms of lymphoid, hematopoietic and related tissues: Secondary | ICD-10-CM | POA: Diagnosis not present

## 2016-06-15 DIAGNOSIS — I10 Essential (primary) hypertension: Secondary | ICD-10-CM | POA: Insufficient documentation

## 2016-06-15 DIAGNOSIS — R600 Localized edema: Secondary | ICD-10-CM | POA: Insufficient documentation

## 2016-06-15 DIAGNOSIS — E1140 Type 2 diabetes mellitus with diabetic neuropathy, unspecified: Secondary | ICD-10-CM | POA: Diagnosis not present

## 2016-06-15 DIAGNOSIS — R609 Edema, unspecified: Secondary | ICD-10-CM

## 2016-06-15 DIAGNOSIS — M7989 Other specified soft tissue disorders: Secondary | ICD-10-CM | POA: Diagnosis present

## 2016-06-15 LAB — CBC WITH DIFFERENTIAL/PLATELET
BASOS ABS: 0 10*3/uL (ref 0.0–0.1)
BASOS PCT: 0 %
Eosinophils Absolute: 0.3 10*3/uL (ref 0.0–0.7)
Eosinophils Relative: 3 %
HEMATOCRIT: 29.8 % — AB (ref 39.0–52.0)
HEMOGLOBIN: 9.6 g/dL — AB (ref 13.0–17.0)
LYMPHS PCT: 17 %
Lymphs Abs: 1.7 10*3/uL (ref 0.7–4.0)
MCH: 24.1 pg — ABNORMAL LOW (ref 26.0–34.0)
MCHC: 32.2 g/dL (ref 30.0–36.0)
MCV: 74.7 fL — AB (ref 78.0–100.0)
MONO ABS: 0.7 10*3/uL (ref 0.1–1.0)
Monocytes Relative: 7 %
NEUTROS ABS: 7.3 10*3/uL (ref 1.7–7.7)
NEUTROS PCT: 73 %
Platelets: 400 10*3/uL (ref 150–400)
RBC: 3.99 MIL/uL — ABNORMAL LOW (ref 4.22–5.81)
RDW: 15.6 % — AB (ref 11.5–15.5)
WBC: 10 10*3/uL (ref 4.0–10.5)

## 2016-06-15 LAB — BASIC METABOLIC PANEL
ANION GAP: 11 (ref 5–15)
BUN: 21 mg/dL — ABNORMAL HIGH (ref 6–20)
CO2: 24 mmol/L (ref 22–32)
Calcium: 8.8 mg/dL — ABNORMAL LOW (ref 8.9–10.3)
Chloride: 103 mmol/L (ref 101–111)
Creatinine, Ser: 2.14 mg/dL — ABNORMAL HIGH (ref 0.61–1.24)
GFR, EST AFRICAN AMERICAN: 35 mL/min — AB (ref >60)
GFR, EST NON AFRICAN AMERICAN: 30 mL/min — AB (ref >60)
GLUCOSE: 148 mg/dL — AB (ref 65–99)
Potassium: 3.2 mmol/L — ABNORMAL LOW (ref 3.5–5.1)
Sodium: 138 mmol/L (ref 135–145)

## 2016-06-15 MED ORDER — OXYCODONE-ACETAMINOPHEN 5-325 MG PO TABS
2.0000 | ORAL_TABLET | Freq: Once | ORAL | Status: AC
  Administered 2016-06-16: 2 via ORAL
  Filled 2016-06-15 (×2): qty 2

## 2016-06-15 NOTE — ED Triage Notes (Signed)
BIB EMS from home, C/O bilateral leg swelling for past 2 days. Pt takes lasix and states legs usually swell but his legs have been more swollen since surgery 4/13

## 2016-06-15 NOTE — ED Provider Notes (Signed)
[facility]PT [facility] Provider Note   CSN: [id number] Arrival date & time: 06/15/16  2047     History   Chief Complaint Chief Complaint  Patient presents with  . Leg Swelling    HPI Domenique Southers is a 66 y.o. male.  The history is provided by the patient. No language interpreter was used.    Ronal Maybury is a 66 y.o. male who presents to the Emergency Department complaining of leg swelling.  He presents for evaluation of 3 days of progressive bilateral lower extremity swelling. On April 12 he had cervical fusion performed. He was doing well at home until last several days when he noticed progressive swelling as well as decreased urinary output since hospital discharge. He reports 3 days of nonproductive cough. No chest pain, shortness of breath, fevers, vomiting. He endorses one day of abdominal distention. Family states that he has been weaker since the surgery as opposed to stronger as they would expect.  Past Medical History:  Diagnosis Date  . Anemia   . 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 Anemia, iron deficiency 03/12/2011  . Anxiety   . Anxiety disorder   . Arthralgia of multiple joints 12/31/2010  . Arthritis   . Constipation   . Diabetes mellitus without complication ([place])   . Diabetic neuropathy ([place])   . Diverticulosis   . Fibromyalgia   . GERD (gastroesophageal reflux disease)   . Headache   . Hemorrhoids   . Herpes zoster   . Hyperlipidemia   . Hypertension   . Hypotestosteronism 03/17/2012  . Multiple myeloma   . Multiple myeloma ([place])   . Myeloma ([place]) 12/31/2010  . Pneumonia   . Renal insufficiency   . Type 2 diabetes mellitus ([place])   . Urinary retention     Patient Active Problem List   Diagnosis Date Noted  . Cervical spondylosis with myelopathy 06/05/2016  . Anemia of chronic renal failure 12/17/2015  . Preventive measure 12/17/2015  . Type 2 diabetes mellitus ([place]) 06/05/2015  . Edema extremities 06/05/2015  . Benign hypertension  06/05/2015  . Abdominal pain in male   . Right sided abdominal pain   . Lower abdominal pain   . Multiple myeloma ([place])   . Acute abdominal pain 11/14/2014  . Generalized abdominal pain   . Esophageal dysphagia   . Hypertensive lower esophageal sphincter   . Dysphagia 09/26/2014  . Rectal bleeding 09/26/2014  . Nausea with vomiting 09/26/2014  . Constipation 09/26/2014  . Hypotestosteronism 03/17/2012  . Can't get food down 01/17/2012  . Hematochezia 07/12/2011  . Anemia, iron deficiency 03/12/2011  . 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 Myeloma ([place]) 12/31/2010  . Diabetes mellitus, type II ([place]) 12/31/2010  . Arthralgia of multiple joints 12/31/2010  . GERD 03/09/2008  . DIVERTICULOSIS-COLON 03/09/2008  . Other dysphagia 03/09/2008    Past Surgical History:  Procedure Laterality Date  . CATARACT EXTRACTION     bi-lateral  . COLONOSCOPY    . COLONOSCOPY  07/13/2011   Procedure: COLONOSCOPY;  Surgeon: Ladene Artist, MD,FACG;  Location: [facility];  Service: Endoscopy;  Laterality: N/A;  . ESOPHAGOGASTRODUODENOSCOPY    . KNEE ARTHROSCOPY     left  . KNEE SURGERY    . PORTACATH PLACEMENT    . POSTERIOR CERVICAL FUSION/FORAMINOTOMY N/A 06/05/2016   Procedure: LAMINECTOMY CERVICAL 3 - CERVICAL 6 WITH LATERAL MASS FUSION;  Surgeon: Consuella Lose, MD;  Location: [facility];  Service: Neurosurgery;  Laterality: N/A;  LAMINECTOMY CERVICAL 3 -  CERVICAL 6 WITH LATERAL MASS FUSION  . TONSILLECTOMY    . TRANSURETHRAL RESECTION OF PROSTATE  01/2011   Dr. at [facility] in [place]       Home Medications    Prior to Admission medications   Medication Sig Start Date End Date Taking? Authorizing Provider  amLODipine (NORVASC) 10 MG tablet Take 10 mg by mouth daily.   Yes Historical Provider, MD  CARAFATE 1 GM/10ML suspension Take 1 g by mouth 4 (four) times daily.  07/28/11  Yes Historical Provider, MD  furosemide (LASIX) 40 MG tablet Take 40 mg by mouth daily.    Yes  Historical Provider, MD  glipiZIDE (GLUCOTROL) 5 MG tablet Take 5 mg by mouth 2 (two) times daily before a meal.    Yes Historical Provider, MD  isosorbide mononitrate (IMDUR) 30 MG 24 hr tablet Take 30 mg by mouth daily.   Yes Historical Provider, MD  latanoprost (XALATAN) 0.005 % ophthalmic solution Place 1 drop into the right eye every evening.    Yes Historical Provider, MD  Linaclotide Rolan Lipa) 145 MCG CAPS capsule Take 1 capsule (145 mcg total) by mouth daily before breakfast. 11/16/14  Yes Laban Emperor Zehr, PA-C  meclizine (ANTIVERT) 50 MG tablet Take 0.5 tablets (25 mg total) by mouth 4 (four) times daily as needed for dizziness. 12/13/11  Yes Virgel Manifold, MD  methocarbamol (ROBAXIN) 500 MG tablet Take 500 mg by mouth 4 (four) times daily.   Yes Historical Provider, MD  metoprolol succinate (TOPROL XL) 50 MG 24 hr tablet Take 50 mg by mouth daily.  06/05/15  Yes Historical Provider, MD  mirabegron ER (MYRBETRIQ) 50 MG TB24 tablet Take 50 mg by mouth daily.   Yes Historical Provider, MD  Morphine-Naltrexone (EMBEDA) 20-0.8 MG CPCR Take 1 capsule by mouth 3 (three) times daily.   Yes Historical Provider, MD  omeprazole (PRILOSEC) 40 MG capsule Take 1 capsule (40 mg total) by mouth 2 (two) times daily. 01/01/16  Yes Ladene Artist, MD  ondansetron (ZOFRAN ODT) 4 MG disintegrating tablet Take 1 tablet (4 mg total) by mouth every 8 (eight) hours as needed for nausea or vomiting. 09/20/14  Yes Alvina Chou, PA-C  oxyCODONE (OXYCONTIN) 20 mg 12 hr tablet Take 1 tablet (20 mg total) by mouth every 12 (twelve) hours. 06/09/16  Yes Consuella Lose, MD  oxyCODONE-acetaminophen (PERCOCET) 10-325 MG tablet Take 1 tablet by mouth every 6 (six) hours as needed for pain. 06/09/16 06/09/17 Yes Vincent J Costella, PA-C  Polyvinyl Alcohol-Povidone (CLEAR EYES ALL SEASONS) 5-6 MG/ML SOLN Place 2 drops into both eyes 3 (three) times daily as needed (drye eyes.).    Yes Historical Provider, MD  Tafluprost  (ZIOPTAN) 0.0015 % SOLN Place 1 drop into both eyes daily.    Yes Historical Provider, MD  Vitamin D, Ergocalciferol, (DRISDOL) 50000 UNITS CAPS capsule Take 50,000 Units by mouth every Saturday.    Yes Historical Provider, MD  dicyclomine (BENTYL) 10 MG capsule Take 1 capsule (10 mg total) by mouth 3 (three) times daily. Patient not taking: Reported on 05/22/2016 11/16/14   Laban Emperor Zehr, PA-C  orphenadrine (NORFLEX) 100 MG tablet Take 1 tablet (100 mg total) by mouth 2 (two) times daily. Patient not taking: Reported on 05/28/2016 05/22/16   Charlesetta Shanks, MD  oxyCODONE-acetaminophen (PERCOCET) 10-325 MG tablet Take 1 tablet by mouth every 6 (six) hours as needed for pain. Patient not taking: Reported on 06/16/2016 03/23/16   Volanda Napoleon, MD  potassium chloride  SA (K-DUR,KLOR-CON) 20 MEQ tablet Take 1 tablet (20 mEq total) by mouth 2 (two) times daily. Patient not taking: Reported on 05/22/2016 11/07/14   Volanda Napoleon, MD  prochlorperazine (COMPAZINE) 10 MG tablet Take 1 tablet (10 mg total) by mouth every 6 (six) hours as needed for nausea or vomiting. Patient not taking: Reported on 05/22/2016 04/18/14   Volanda Napoleon, MD    Family History Family History  Problem Relation Age of Onset  . Breast cancer Sister   . Breast cancer Mother   . Heart disease Mother   . Diabetes Sister   . Bone cancer Father   . Heart disease Sister   . Heart disease Daughter   . Throat cancer Brother   . Stomach cancer Brother   . Colon cancer Neg Hx     Social History Social History  Substance Use Topics  . Smoking status: Never Smoker  . Smokeless tobacco: Never Used     Comment: never used tobacco  . Alcohol use No     Allergies   Iodine   Review of Systems Review of Systems  All other systems reviewed and are negative.    Physical Exam Updated Vital Signs BP (!) 141/74   Pulse 83   Temp 98.8 F (37.1 C) (Oral)   Resp 12   Ht '5\' 11"'  (1.803 m)   Wt 200 lb (90.7 kg)   SpO2 94%    BMI 27.89 kg/m   Physical Exam  Constitutional: He is oriented to person, place, and time. He appears well-developed and well-nourished.  HENT:  Head: Normocephalic and atraumatic.  Neck:  Cervical collar in place  Cardiovascular: Normal rate and regular rhythm.   No murmur heard. Pulmonary/Chest: Effort normal. No respiratory distress.  Crackles in the right lung base  Abdominal: There is no rebound and no guarding.  mild diffuse tenderness  Musculoskeletal: He exhibits no tenderness.  2+ edema to bilateral lower extremities  Neurological: He is alert and oriented to person, place, and time.  Generalized weakness  Skin: Skin is warm and dry.  Psychiatric: He has a normal mood and affect. His behavior is normal.  Nursing note and vitals reviewed.    ED Treatments / Results  Labs (all labs ordered are listed, but only abnormal results are displayed) Labs Reviewed  BASIC METABOLIC PANEL - Abnormal; Notable for the following:       Result Value   Potassium 3.2 (*)    Glucose, Bld 148 (*)    BUN 21 (*)    Creatinine, Ser 2.14 (*)    Calcium 8.8 (*)    GFR calc non Af Amer 30 (*)    GFR calc Af Amer 35 (*)    All other components within normal limits  CBC WITH DIFFERENTIAL/PLATELET - Abnormal; Notable for the following:    RBC 3.99 (*)    Hemoglobin 9.6 (*)    HCT 29.8 (*)    MCV 74.7 (*)    MCH 24.1 (*)    RDW 15.6 (*)    All other components within normal limits  BRAIN NATRIURETIC PEPTIDE    EKG  EKG Interpretation  Date/Time:  Monday June 15 2016 22:31:18 EDT Ventricular Rate:  85 PR Interval:    QRS Duration: 88 QT Interval:  369 QTC Calculation: 439 R Axis:   77 Text Interpretation:  Sinus rhythm Minimal ST elevation, inferior leads Confirmed by Hazle Coca [phone]) on 06/15/2016 10:44:00 PM Also confirmed by Hazle Coca [phone]), editor  Doristine Devoid [phone])  on 06/16/2016 8:16:25 AM       Radiology Dg Chest 2 View  Result Date: 06/15/2016 CLINICAL  DATA:  Cough times 2-3 days without dyspnea and chest pain. EXAM: CHEST  2 VIEW COMPARISON:  07/21/2015 CXR FINDINGS: Stable borderline cardiomegaly. No aortic aneurysm. Subsegmental atelectasis and/or scarring at the left lung base. Port catheter tip terminates in the distal SVC. No pneumonic consolidation, CHF or pneumothorax. No acute nor suspicious osseous lesions. Right lateral osteophytes are again noted along the dorsal spine. IMPRESSION: No active cardiopulmonary disease. Electronically Signed   By: Ashley Royalty M.D.   On: 06/15/2016 23:43    Procedures Procedures (including critical care time)  Medications Ordered in ED Medications  oxyCODONE-acetaminophen (PERCOCET/ROXICET) 5-325 MG per tablet 2 tablet (2 tablets Oral Given 06/16/16 0004)  furosemide (LASIX) injection 40 mg (40 mg Intravenous Given 06/16/16 0115)  potassium chloride SA (K-DUR,KLOR-CON) CR tablet 20 mEq (20 mEq Oral Given 06/16/16 0120)     Initial Impression / Assessment and Plan / ED Course  I have reviewed the triage vital signs and the nursing notes.  Pertinent labs & imaging results that were available during my care of the patient were reviewed by me and considered in my medical decision making (see chart for details).    Pt here for progressive lower extremity edema following recent cervical fusion.  He is nontoxic appearing with generalized weakness, no focal neurologic deficits.  No evidence of pulmonary edema or pneumonia on imaging. Renal function is at his baseline. He has only been taking his Lasix once daily instead of the prescribed twice daily. Provide a one-time dose of Lasix in the emergency department with instructions for patient to continue his Lasix as previously prescribed. A picture is not consistent with CHF, to expect he'll infection, DVT. Counseled patient on PCP as well as neurosurgery follow-up. Case management consulted to help with assistance in arranging home health and physical therapy  services at home.  Final Clinical Impressions(s) / ED Diagnoses   Final diagnoses:  Peripheral edema  Weakness    New Prescriptions Discharge Medication List as of 06/16/2016 12:31 AM       Quintella Reichert, MD 06/16/16 [phone]

## 2016-06-15 NOTE — ED Notes (Signed)
Pt reports leg swelling X2 days. He reports he "takes fluid pills". Denies any change in medication.

## 2016-06-16 ENCOUNTER — Telehealth: Payer: Self-pay | Admitting: *Deleted

## 2016-06-16 DIAGNOSIS — R600 Localized edema: Secondary | ICD-10-CM | POA: Diagnosis not present

## 2016-06-16 LAB — BRAIN NATRIURETIC PEPTIDE: B NATRIURETIC PEPTIDE 5: 16.1 pg/mL (ref 0.0–100.0)

## 2016-06-16 MED ORDER — POTASSIUM CHLORIDE CRYS ER 20 MEQ PO TBCR
20.0000 meq | EXTENDED_RELEASE_TABLET | Freq: Once | ORAL | Status: AC
  Administered 2016-06-16: 20 meq via ORAL
  Filled 2016-06-16: qty 1

## 2016-06-16 MED ORDER — FUROSEMIDE 10 MG/ML IJ SOLN
40.0000 mg | Freq: Once | INTRAMUSCULAR | Status: AC
  Administered 2016-06-16: 40 mg via INTRAVENOUS
  Filled 2016-06-16: qty 4

## 2016-06-16 NOTE — ED Notes (Signed)
Pt verbalized understanding of d/c instructions and has no further questions. Pt is stable, A&Ox4, VSS.  

## 2016-06-16 NOTE — Telephone Encounter (Signed)
EDCM called to follow-up on pt appointment.  [facility] states numbers given were incorrect.

## 2016-06-17 ENCOUNTER — Telehealth: Payer: Self-pay | Admitting: *Deleted

## 2016-06-17 NOTE — Telephone Encounter (Signed)
Maclain Cohron J. Izzah Pasqua, RN, BSN, NCM [phone] Spoke with pt at bedside regarding discharge planning for Home Health Services. Offered pt list of home health agencies to choose from.  Pt chose [facility] to render services. Drew of [facility] notified. Patient made aware that [facility] will be in contact in 24-48 hours.  No DME needs identified at this time.  

## 2016-06-17 NOTE — Telephone Encounter (Signed)
Previous note was entered in error.

## 2016-06-29 ENCOUNTER — Ambulatory Visit: Payer: Medicare Other

## 2016-06-29 ENCOUNTER — Other Ambulatory Visit: Payer: Medicare Other

## 2016-07-07 ENCOUNTER — Other Ambulatory Visit: Payer: Self-pay | Admitting: *Deleted

## 2016-07-07 ENCOUNTER — Ambulatory Visit (HOSPITAL_BASED_OUTPATIENT_CLINIC_OR_DEPARTMENT_OTHER): Payer: Medicare Other

## 2016-07-07 DIAGNOSIS — D509 Iron deficiency anemia, unspecified: Secondary | ICD-10-CM | POA: Diagnosis not present

## 2016-07-07 DIAGNOSIS — D631 Anemia in chronic kidney disease: Secondary | ICD-10-CM | POA: Diagnosis not present

## 2016-07-07 DIAGNOSIS — D508 Other iron deficiency anemias: Secondary | ICD-10-CM

## 2016-07-07 DIAGNOSIS — N189 Chronic kidney disease, unspecified: Secondary | ICD-10-CM

## 2016-07-07 DIAGNOSIS — C9000 Multiple myeloma not having achieved remission: Secondary | ICD-10-CM

## 2016-07-07 LAB — COMPREHENSIVE METABOLIC PANEL (CC13)
A/G RATIO: 1 — AB (ref 1.2–2.2)
ALBUMIN: 4.3 g/dL (ref 3.6–4.8)
ALT: 21 IU/L (ref 0–44)
AST (SGOT): 22 IU/L (ref 0–40)
Alkaline Phosphatase, S: 101 IU/L (ref 39–117)
BUN / CREAT RATIO: 11 (ref 10–24)
BUN: 26 mg/dL (ref 8–27)
Bilirubin Total: <0.2 mg/dL (ref 0.0–1.2)
CO2: 27 mmol/L (ref 18–29)
CREATININE: 2.46 mg/dL — AB (ref 0.76–1.27)
Calcium, Ser: 9.6 mg/dL (ref 8.6–10.2)
Chloride, Ser: 100 mmol/L (ref 96–106)
GFR calc Af Amer: 30 mL/min/{1.73_m2} — ABNORMAL LOW (ref >59)
GFR, EST NON AFRICAN AMERICAN: 26 mL/min/{1.73_m2} — AB (ref >59)
GLOBULIN, TOTAL: 4.2 g/dL (ref 1.5–4.5)
Glucose: 83 mg/dL (ref 65–99)
POTASSIUM: 4.4 mmol/L (ref 3.5–5.2)
Sodium: 132 mmol/L — ABNORMAL LOW (ref 134–144)
Total Protein: 8.5 g/dL (ref 6.0–8.5)

## 2016-07-07 LAB — CBC WITH DIFFERENTIAL (CANCER CENTER ONLY)
BASO#: 0 10*3/uL (ref 0.0–0.2)
BASO%: 0.4 % (ref 0.0–2.0)
EOS ABS: 0.3 10*3/uL (ref 0.0–0.5)
EOS%: 3.7 % (ref 0.0–7.0)
HCT: 34.9 % — ABNORMAL LOW (ref 38.7–49.9)
HEMOGLOBIN: 11.6 g/dL — AB (ref 13.0–17.1)
LYMPH#: 2 10*3/uL (ref 0.9–3.3)
LYMPH%: 26.2 % (ref 14.0–48.0)
MCH: 25.1 pg — AB (ref 28.0–33.4)
MCHC: 33.2 g/dL (ref 32.0–35.9)
MCV: 76 fL — ABNORMAL LOW (ref 82–98)
MONO#: 0.8 10*3/uL (ref 0.1–0.9)
MONO%: 10.7 % (ref 0.0–13.0)
NEUT#: 4.5 10*3/uL (ref 1.5–6.5)
NEUT%: 59 % (ref 40.0–80.0)
PLATELETS: 296 10*3/uL (ref 145–400)
RBC: 4.62 10*6/uL (ref 4.20–5.70)
RDW: 15.4 % (ref 11.1–15.7)
WBC: 7.6 10*3/uL (ref 4.0–10.0)

## 2016-07-08 LAB — FERRITIN

## 2016-07-08 LAB — KAPPA/LAMBDA LIGHT CHAINS
IG KAPPA FREE LIGHT CHAIN: 58.8 mg/L — AB (ref 3.3–19.4)
Ig Lambda Free Light Chain: 35.7 mg/L — ABNORMAL HIGH (ref 5.7–26.3)
KAPPA/LAMBDA FLC RATIO: 1.65 (ref 0.26–1.65)

## 2016-07-08 LAB — TESTOSTERONE: Testosterone, Serum: 157 ng/dL — ABNORMAL LOW (ref 264–916)

## 2016-07-08 LAB — IGG, IGA, IGM
IGA/IMMUNOGLOBULIN A, SERUM: 316 mg/dL (ref 61–437)
IgG, Qn, Serum: 1868 mg/dL — ABNORMAL HIGH (ref 700–1600)
IgM, Qn, Serum: 57 mg/dL (ref 20–172)

## 2016-07-08 LAB — IRON AND TIBC
%SAT: 22 % (ref 20–55)
IRON: 51 ug/dL (ref 42–163)
TIBC: 234 ug/dL (ref 202–409)
UIBC: 182 ug/dL (ref 117–376)

## 2016-07-10 ENCOUNTER — Other Ambulatory Visit: Payer: Self-pay | Admitting: *Deleted

## 2016-07-10 DIAGNOSIS — C9000 Multiple myeloma not having achieved remission: Secondary | ICD-10-CM

## 2016-07-10 LAB — PROTEIN ELECTROPHORESIS, SERUM, WITH REFLEX
A/G RATIO SPE: 0.8 (ref 0.7–1.7)
ALBUMIN: 3.8 g/dL (ref 2.9–4.4)
ALPHA 1: 0.2 g/dL (ref 0.0–0.4)
Alpha 2: 1.2 g/dL — ABNORMAL HIGH (ref 0.4–1.0)
Beta: 1.1 g/dL (ref 0.7–1.3)
Gamma Globulin: 2 g/dL — ABNORMAL HIGH (ref 0.4–1.8)
Globulin, Total: 4.5 g/dL — ABNORMAL HIGH (ref 2.2–3.9)
Interpretation(See Below): 0
M-SPIKE, %: 1.4 g/dL — AB
TOTAL PROTEIN: 8.3 g/dL (ref 6.0–8.5)

## 2016-07-13 ENCOUNTER — Ambulatory Visit (HOSPITAL_BASED_OUTPATIENT_CLINIC_OR_DEPARTMENT_OTHER): Payer: Medicare Other

## 2016-07-13 ENCOUNTER — Other Ambulatory Visit: Payer: Medicare Other

## 2016-07-13 ENCOUNTER — Ambulatory Visit: Payer: Medicare Other

## 2016-07-13 VITALS — BP 153/67 | HR 78 | Temp 98.4°F | Resp 19

## 2016-07-13 DIAGNOSIS — E291 Testicular hypofunction: Secondary | ICD-10-CM | POA: Diagnosis not present

## 2016-07-13 DIAGNOSIS — Z95828 Presence of other vascular implants and grafts: Secondary | ICD-10-CM

## 2016-07-13 DIAGNOSIS — E349 Endocrine disorder, unspecified: Secondary | ICD-10-CM

## 2016-07-13 MED ORDER — TESTOSTERONE CYPIONATE 200 MG/ML IM SOLN
INTRAMUSCULAR | Status: AC
  Filled 2016-07-13: qty 2

## 2016-07-13 MED ORDER — SODIUM CHLORIDE 0.9% FLUSH
10.0000 mL | INTRAVENOUS | Status: DC | PRN
  Administered 2016-07-13: 10 mL via INTRAVENOUS
  Filled 2016-07-13: qty 10

## 2016-07-13 MED ORDER — MORPHINE SULFATE (PF) 4 MG/ML IV SOLN
INTRAVENOUS | Status: AC
  Filled 2016-07-13: qty 1

## 2016-07-13 MED ORDER — HEPARIN SOD (PORK) LOCK FLUSH 100 UNIT/ML IV SOLN
500.0000 [IU] | Freq: Once | INTRAVENOUS | Status: AC
  Administered 2016-07-13: 500 [IU] via INTRAVENOUS
  Filled 2016-07-13: qty 5

## 2016-07-13 MED ORDER — MORPHINE SULFATE 4 MG/ML IJ SOLN
2.0000 mg | Freq: Once | INTRAMUSCULAR | Status: AC
  Administered 2016-07-13: 2 mg via INTRAVENOUS
  Filled 2016-07-13: qty 1

## 2016-07-13 MED ORDER — TESTOSTERONE CYPIONATE 200 MG/ML IM SOLN
300.0000 mg | INTRAMUSCULAR | Status: DC
  Administered 2016-07-13: 300 mg via INTRAMUSCULAR

## 2016-07-21 ENCOUNTER — Ambulatory Visit (HOSPITAL_BASED_OUTPATIENT_CLINIC_OR_DEPARTMENT_OTHER): Payer: Medicare Other

## 2016-07-21 ENCOUNTER — Ambulatory Visit (HOSPITAL_BASED_OUTPATIENT_CLINIC_OR_DEPARTMENT_OTHER): Payer: Medicare Other | Admitting: Hematology & Oncology

## 2016-07-21 ENCOUNTER — Ambulatory Visit: Payer: Medicare Other

## 2016-07-21 ENCOUNTER — Other Ambulatory Visit (HOSPITAL_BASED_OUTPATIENT_CLINIC_OR_DEPARTMENT_OTHER): Payer: Medicare Other

## 2016-07-21 ENCOUNTER — Other Ambulatory Visit: Payer: Self-pay | Admitting: Family

## 2016-07-21 VITALS — BP 136/62 | HR 74 | Temp 98.5°F | Resp 18 | Wt 190.0 lb

## 2016-07-21 DIAGNOSIS — C9000 Multiple myeloma not having achieved remission: Secondary | ICD-10-CM

## 2016-07-21 DIAGNOSIS — E291 Testicular hypofunction: Secondary | ICD-10-CM | POA: Diagnosis not present

## 2016-07-21 DIAGNOSIS — M542 Cervicalgia: Secondary | ICD-10-CM

## 2016-07-21 DIAGNOSIS — E349 Endocrine disorder, unspecified: Secondary | ICD-10-CM

## 2016-07-21 DIAGNOSIS — D508 Other iron deficiency anemias: Secondary | ICD-10-CM

## 2016-07-21 DIAGNOSIS — E559 Vitamin D deficiency, unspecified: Secondary | ICD-10-CM

## 2016-07-21 DIAGNOSIS — C9001 Multiple myeloma in remission: Secondary | ICD-10-CM

## 2016-07-21 DIAGNOSIS — N189 Chronic kidney disease, unspecified: Secondary | ICD-10-CM

## 2016-07-21 DIAGNOSIS — D509 Iron deficiency anemia, unspecified: Secondary | ICD-10-CM

## 2016-07-21 DIAGNOSIS — Z299 Encounter for prophylactic measures, unspecified: Secondary | ICD-10-CM

## 2016-07-21 DIAGNOSIS — D631 Anemia in chronic kidney disease: Secondary | ICD-10-CM

## 2016-07-21 DIAGNOSIS — M2550 Pain in unspecified joint: Secondary | ICD-10-CM

## 2016-07-21 LAB — FERRITIN

## 2016-07-21 LAB — CMP (CANCER CENTER ONLY)
ALBUMIN: 3.4 g/dL (ref 3.3–5.5)
ALT(SGPT): 25 U/L (ref 10–47)
AST: 25 U/L (ref 11–38)
Alkaline Phosphatase: 98 U/L — ABNORMAL HIGH (ref 26–84)
BUN: 25 mg/dL — AB (ref 7–22)
CHLORIDE: 100 meq/L (ref 98–108)
CO2: 30 mEq/L (ref 18–33)
CREATININE: 2.7 mg/dL — AB (ref 0.6–1.2)
Calcium: 9.2 mg/dL (ref 8.0–10.3)
GLUCOSE: 160 mg/dL — AB (ref 73–118)
POTASSIUM: 4.1 meq/L (ref 3.3–4.7)
Sodium: 136 mEq/L (ref 128–145)
Total Bilirubin: 0.5 mg/dl (ref 0.20–1.60)
Total Protein: 8.6 g/dL — ABNORMAL HIGH (ref 6.4–8.1)

## 2016-07-21 LAB — CBC WITH DIFFERENTIAL (CANCER CENTER ONLY)
BASO#: 0.1 10*3/uL (ref 0.0–0.2)
BASO%: 0.6 % (ref 0.0–2.0)
EOS ABS: 0.2 10*3/uL (ref 0.0–0.5)
EOS%: 3.1 % (ref 0.0–7.0)
HCT: 33.3 % — ABNORMAL LOW (ref 38.7–49.9)
HEMOGLOBIN: 11 g/dL — AB (ref 13.0–17.1)
LYMPH#: 1.9 10*3/uL (ref 0.9–3.3)
LYMPH%: 24.1 % (ref 14.0–48.0)
MCH: 24.6 pg — ABNORMAL LOW (ref 28.0–33.4)
MCHC: 33 g/dL (ref 32.0–35.9)
MCV: 74 fL — AB (ref 82–98)
MONO#: 0.8 10*3/uL (ref 0.1–0.9)
MONO%: 9.7 % (ref 0.0–13.0)
NEUT%: 62.5 % (ref 40.0–80.0)
NEUTROS ABS: 4.9 10*3/uL (ref 1.5–6.5)
Platelets: 246 10*3/uL (ref 145–400)
RBC: 4.48 10*6/uL (ref 4.20–5.70)
RDW: 15.6 % (ref 11.1–15.7)
WBC: 7.9 10*3/uL (ref 4.0–10.0)

## 2016-07-21 LAB — IRON AND TIBC
%SAT: 34 % (ref 20–55)
IRON: 76 ug/dL (ref 42–163)
TIBC: 226 ug/dL (ref 202–409)
UIBC: 150 ug/dL (ref 117–376)

## 2016-07-21 MED ORDER — SODIUM CHLORIDE 0.9 % IV SOLN
60.0000 mg | Freq: Once | INTRAVENOUS | Status: DC
  Filled 2016-07-21: qty 20

## 2016-07-21 MED ORDER — HYDROMORPHONE HCL 1 MG/ML IJ SOLN
2.0000 mg | Freq: Once | INTRAMUSCULAR | Status: AC
  Administered 2016-07-21: 2 mg via INTRAVENOUS

## 2016-07-21 MED ORDER — HYDROMORPHONE HCL 4 MG/ML IJ SOLN
2.0000 mg | Freq: Once | INTRAMUSCULAR | Status: AC
  Administered 2016-07-21: 2 mg via INTRAVENOUS

## 2016-07-21 MED ORDER — HYDROMORPHONE HCL 1 MG/ML IJ SOLN
INTRAMUSCULAR | Status: AC
  Filled 2016-07-21: qty 2

## 2016-07-21 MED ORDER — SODIUM CHLORIDE 0.9 % IV SOLN
60.0000 mg | Freq: Once | INTRAVENOUS | Status: DC
  Administered 2016-07-21: 60 mg via INTRAVENOUS
  Filled 2016-07-21: qty 10

## 2016-07-21 NOTE — Patient Instructions (Signed)
Pamidronate injection What is this medicine? PAMIDRONATE (pa mi DROE nate) slows calcium loss from bones. It is used to treat high calcium blood levels from cancer or Paget's disease. It is also used to treat bone pain and prevent fractures from certain cancers that have spread to the bone. This medicine may be used for other purposes; ask your health care provider or pharmacist if you have questions. COMMON BRAND NAME(S): Aredia What should I tell my health care provider before I take this medicine? They need to know if you have any of these conditions: -aspirin-sensitive asthma -dental disease -kidney disease -an unusual or allergic reaction to pamidronate, other medicines, foods, dyes, or preservatives -pregnant or trying to get pregnant -breast-feeding How should I use this medicine? This medicine is for infusion into a vein. It is given by a health care professional in a hospital or clinic setting. Talk to your pediatrician regarding the use of this medicine in children. This medicine is not approved for use in children. Overdosage: If you think you have taken too much of this medicine contact a poison control center or emergency room at once. NOTE: This medicine is only for you. Do not share this medicine with others. What if I miss a dose? This does not apply. What may interact with this medicine? -certain antibiotics given by injection -medicines for inflammation or pain like ibuprofen, naproxen -some diuretics like bumetanide, furosemide -cyclosporine -parathyroid hormone -tacrolimus -teriparatide -thalidomide This list may not describe all possible interactions. Give your health care provider a list of all the medicines, herbs, non-prescription drugs, or dietary supplements you use. Also tell them if you smoke, drink alcohol, or use illegal drugs. Some items may interact with your medicine. What should I watch for while using this medicine? Visit your doctor or health care  professional for regular checkups. It may be some time before you see the benefit from this medicine. Do not stop taking your medicine unless your doctor tells you to. Your doctor may order blood tests or other tests to see how you are doing. Women should inform their doctor if they wish to become pregnant or think they might be pregnant. There is a potential for serious side effects to an unborn child. Talk to your health care professional or pharmacist for more information. You should make sure that you get enough calcium and vitamin D while you are taking this medicine. Discuss the foods you eat and the vitamins you take with your health care professional. Some people who take this medicine have severe bone, joint, and/or muscle pain. This medicine may also increase your risk for a broken thigh bone. Tell your doctor right away if you have pain in your upper leg or groin. Tell your doctor if you have any pain that does not go away or that gets worse. What side effects may I notice from receiving this medicine? Side effects that you should report to your doctor or health care professional as soon as possible: -allergic reactions like skin rash, itching or hives, swelling of the face, lips, or tongue -black or tarry stools -changes in vision -eye inflammation, pain -high blood pressure -jaw pain, especially burning or cramping -muscle weakness -numb, tingling pain -swelling of feet or hands -trouble passing urine or change in the amount of urine -unable to move easily Side effects that usually do not require medical attention (report to your doctor or health care professional if they continue or are bothersome): -bone, joint, or muscle pain -constipation -dizzy, drowsy -  fever -headache -loss of appetite -nausea, vomiting -pain at site where injected This list may not describe all possible side effects. Call your doctor for medical advice about side effects. You may report side effects to  FDA at 1-800-FDA-1088. Where should I keep my medicine? This drug is given in a hospital or clinic and will not be stored at home. NOTE: This sheet is a summary. It may not cover all possible information. If you have questions about this medicine, talk to your doctor, pharmacist, or health care provider.  2018 Elsevier/Gold Standard (2010-08-08 08:49:49)  

## 2016-07-21 NOTE — Addendum Note (Signed)
Addended by: Arbutus Ped on: 07/21/2016 09:43 AM   Modules accepted: Orders

## 2016-07-21 NOTE — Progress Notes (Signed)
Hematology and Oncology Follow Up Visit  [name] [id number] 06-16-1950 66 y.o. 07/21/2016   Principle Diagnosis:  1. IgG kappa myeloma. 2. Anemia secondary to renal insufficiency. 3. Intermittent iron-deficiency anemia. 4. Hypotestosteronemia  Current Therapy:   1. Aredia 60 mg IV q.2 months  2. Aranesp 300 mcg subcu as needed for hemoglobin less than 11. 3. Depo-Testosterone 300 mg q.2 weeks. 4. IV iron as indicated.     Interim History:  [name] is back for followup.  He did have neck fusion back on April 13. He was fused from C3-C6. This went well for what I can tell. He is having some pain issues. No specimens were sent to pathology.  His last M spike back in May was 1.4 g/dL. This might be a little elevated because he had recent neck surgery. His IgG level was 1868 mg/dL. His Kappa Lightchain was 5.9 mg/dL.  He is wearing a neck collar from his surgery. Hopefully he will be able to take this off soon.  His iron studies have been borderline on the lower side. He's had no bleeding.  His blood sugars typically are on the higher side.  Overall, his performance status is ECOG 1.   Medications:  Current Outpatient Prescriptions:  .  furosemide (LASIX) 40 MG tablet, Take 40 mg by mouth., Disp: , Rfl:  .  losartan (COZAAR) 25 MG tablet, Take 25 mg by mouth., Disp: , Rfl:  .  amLODipine (NORVASC) 10 MG tablet, Take 10 mg by mouth daily., Disp: , Rfl:  .  CARAFATE 1 GM/10ML suspension, Take 1 g by mouth 4 (four) times daily. , Disp: , Rfl:  .  dicyclomine (BENTYL) 10 MG capsule, Take 1 capsule (10 mg total) by mouth 3 (three) times daily. (Patient not taking: Reported on 05/22/2016), Disp: 90 capsule, Rfl: 2 .  glipiZIDE (GLUCOTROL) 5 MG tablet, Take 5 mg by mouth 2 (two) times daily before a meal. , Disp: , Rfl:  .  isosorbide mononitrate (IMDUR) 30 MG 24 hr tablet, Take 30 mg by mouth daily., Disp: , Rfl:  .  latanoprost (XALATAN) 0.005 % ophthalmic solution, Place 1  drop into the right eye every evening. , Disp: , Rfl:  .  Linaclotide (LINZESS) 145 MCG CAPS capsule, Take 1 capsule (145 mcg total) by mouth daily before breakfast., Disp: 30 capsule, Rfl: 2 .  meclizine (ANTIVERT) 50 MG tablet, Take 0.5 tablets (25 mg total) by mouth 4 (four) times daily as needed for dizziness., Disp: 30 tablet, Rfl: 0 .  methocarbamol (ROBAXIN) 500 MG tablet, Take 500 mg by mouth 4 (four) times daily., Disp: , Rfl:  .  metoprolol succinate (TOPROL XL) 50 MG 24 hr tablet, Take 50 mg by mouth daily. , Disp: , Rfl:  .  mirabegron ER (MYRBETRIQ) 50 MG TB24 tablet, Take 50 mg by mouth daily., Disp: , Rfl:  .  Morphine-Naltrexone (EMBEDA) 20-0.8 MG CPCR, Take 1 capsule by mouth 3 (three) times daily., Disp: , Rfl:  .  omeprazole (PRILOSEC) 40 MG capsule, Take 1 capsule (40 mg total) by mouth 2 (two) times daily., Disp: 60 capsule, Rfl: 5 .  ondansetron (ZOFRAN ODT) 4 MG disintegrating tablet, Take 1 tablet (4 mg total) by mouth every 8 (eight) hours as needed for nausea or vomiting., Disp: 10 tablet, Rfl: 0 .  orphenadrine (NORFLEX) 100 MG tablet, Take 1 tablet (100 mg total) by mouth 2 (two) times daily. (Patient not taking: Reported on 05/28/2016), Disp: 30 tablet, Rfl:  0 .  oxyCODONE (OXYCONTIN) 20 mg 12 hr tablet, Take 1 tablet (20 mg total) by mouth every 12 (twelve) hours., Disp: 14 tablet, Rfl: 0 .  oxyCODONE-acetaminophen (PERCOCET) 10-325 MG tablet, Take 1 tablet by mouth every 6 (six) hours as needed for pain. (Patient not taking: Reported on 06/16/2016), Disp: 30 tablet, Rfl: 0 .  oxyCODONE-acetaminophen (PERCOCET) 10-325 MG tablet, Take 1 tablet by mouth every 6 (six) hours as needed for pain., Disp: 60 tablet, Rfl: 0 .  Polyvinyl Alcohol-Povidone (CLEAR EYES ALL SEASONS) 5-6 MG/ML SOLN, Place 2 drops into both eyes 3 (three) times daily as needed (drye eyes.). , Disp: , Rfl:  .  potassium chloride SA (K-DUR,KLOR-CON) 20 MEQ tablet, Take 1 tablet (20 mEq total) by mouth 2 (two)  times daily. (Patient not taking: Reported on 05/22/2016), Disp: 60 tablet, Rfl: 6 .  prochlorperazine (COMPAZINE) 10 MG tablet, Take 1 tablet (10 mg total) by mouth every 6 (six) hours as needed for nausea or vomiting. (Patient not taking: Reported on 05/22/2016), Disp: 60 tablet, Rfl: 2 .  Tafluprost (ZIOPTAN) 0.0015 % SOLN, Place 1 drop into both eyes daily. , Disp: , Rfl:  .  Vitamin D, Ergocalciferol, (DRISDOL) 50000 UNITS CAPS capsule, Take 50,000 Units by mouth every Saturday. , Disp: , Rfl:  No current facility-administered medications for this visit.   Facility-Administered Medications Ordered in Other Visits:  .  morphine 4 MG/ML injection 4 mg, 4 mg, Intravenous, Q4H PRN, [place], Rudell Cobb, MD .  pamidronate (AREDIA) 60 mg in sodium chloride 0.9 % 500 mL IVPB, 60 mg, Intravenous, Once, [place], Holli Humbles, NP  Allergies:  Allergies  Allergen Reactions  . Iodine Anxiety and Rash    Didn't feel right "instructed not to take per MD--something with his port"    Past Medical History, Surgical history, Social history, and Family History were reviewed and updated.  Review of Systems: As above  Physical Exam:  weight is 190 lb (86.2 kg). His oral temperature is 98.5 F (36.9 C). His blood pressure is 136/62 and his pulse is 74. His respiration is 18 and oxygen saturation is 100%.   His head and neck exam shows no ocular or oral lesions. He has no adenopathy in the neck. His thyroid is non-palpable. Lungs are clear. Cardiac exam regular rate and rhythm with no murmurs, rubs or bruits.. Abdomen is soft. His abdomen is slightly distended. He has good bowel sounds. There is no fluid wave. There is no palpable liver or spleen tip. Back exam shows no tenderness in the lumbar spine. There is no paravertebral muscle spasms. . He has slightly decreased range of motion of the lower back. Extremities shows no clubbing cyanosis or edema.He does have a swelling in the posterior right shoulder. There is  some tenderness in this area to palpation. He has fairly decent range of motion of the right arm. He has decent strength in his legs. Skin exam shows no rashes. Neurological exam is nonfocal.  Lab Results  Component Value Date   WBC 7.9 07/21/2016   HGB 11.0 (L) 07/21/2016   HCT 33.3 (L) 07/21/2016   MCV 74 (L) 07/21/2016   PLT 246 07/21/2016     Chemistry      Component Value Date/Time   NA 132 (L) 07/07/2016 1332   NA 142 05/18/2016 0817   K 4.4 07/07/2016 1332   K 3.6 05/18/2016 0817   CL 100 07/07/2016 1332   CL 102 05/18/2016 0817   CO2 27  07/07/2016 1332   CO2 28 05/18/2016 0817   BUN 26 07/07/2016 1332   BUN 19 05/18/2016 0817   CREATININE 2.46 (H) 07/07/2016 1332   CREATININE 2.2 (H) 05/18/2016 0817      Component Value Date/Time   CALCIUM 9.6 07/07/2016 1332   CALCIUM 8.8 05/18/2016 0817   ALKPHOS 101 07/07/2016 1332   ALKPHOS 77 05/18/2016 0817   AST 22 07/07/2016 1332   AST 26 05/18/2016 0817   ALT 21 07/07/2016 1332   ALT 25 05/18/2016 0817   BILITOT <0.2 07/07/2016 1332   BILITOT 0.60 05/18/2016 0817         Impression and Plan: [name] is a 66 year old gentleman. He has multiple medical problems.   I'm glad that he got through his neck surgery without any complications. Hopefully this will help her mouth.  I saw that his myeloma spike went up a little bit. We will have to watch this. This might be related to his recent surgery and might indicate inflammatory issues. Again we will see what his M spike is. Hopefully, we will not have to embark upon any therapy.  We will have to see what his iron levels are. He may need a little bit of iron.  He'll get his Aredia.  We will plan to get him back to see [place] in another 2 months. I will see him back to see me in 2 more months.  Volanda Napoleon, MD 5/29/20189:12 AM

## 2016-07-21 NOTE — Patient Instructions (Signed)
Implanted Port Insertion, Care After °This sheet gives you information about how to care for yourself after your procedure. Your health care provider may also give you more specific instructions. If you have problems or questions, contact your health care provider. °What can I expect after the procedure? °After your procedure, it is common to have: °· Discomfort at the port insertion site. °· Bruising on the skin over the port. This should improve over 3-4 days. ° °Follow these instructions at home: °Port care °· After your port is placed, you will get a manufacturer's information card. The card has information about your port. Keep this card with you at all times. °· Take care of the port as told by your health care provider. Ask your health care provider if you or a family member can get training for taking care of the port at home. A home health care nurse may also take care of the port. °· Make sure to remember what type of port you have. °Incision care °· Follow instructions from your health care provider about how to take care of your port insertion site. Make sure you: °? Wash your hands with soap and water before you change your bandage (dressing). If soap and water are not available, use hand sanitizer. °? Change your dressing as told by your health care provider. °? Leave stitches (sutures), skin glue, or adhesive strips in place. These skin closures may need to stay in place for 2 weeks or longer. If adhesive strip edges start to loosen and curl up, you may trim the loose edges. Do not remove adhesive strips completely unless your health care provider tells you to do that. °· Check your port insertion site every day for signs of infection. Check for: °? More redness, swelling, or pain. °? More fluid or blood. °? Warmth. °? Pus or a bad smell. °General instructions °· Do not take baths, swim, or use a hot tub until your health care provider approves. °· Do not lift anything that is heavier than 10 lb (4.5  kg) for a week, or as told by your health care provider. °· Ask your health care provider when it is okay to: °? Return to work or school. °? Resume usual physical activities or sports. °· Do not drive for 24 hours if you were given a medicine to help you relax (sedative). °· Take over-the-counter and prescription medicines only as told by your health care provider. °· Wear a medical alert bracelet in case of an emergency. This will tell any health care providers that you have a port. °· Keep all follow-up visits as told by your health care provider. This is important. °Contact a health care provider if: °· You cannot flush your port with saline as directed, or you cannot draw blood from the port. °· You have a fever or chills. °· You have more redness, swelling, or pain around your port insertion site. °· You have more fluid or blood coming from your port insertion site. °· Your port insertion site feels warm to the touch. °· You have pus or a bad smell coming from the port insertion site. °Get help right away if: °· You have chest pain or shortness of breath. °· You have bleeding from your port that you cannot control. °Summary °· Take care of the port as told by your health care provider. °· Change your dressing as told by your health care provider. °· Keep all follow-up visits as told by your health care provider. °  This information is not intended to replace advice given to you by your health care provider. Make sure you discuss any questions you have with your health care provider. °Document Released: 11/30/2012 Document Revised: 01/01/2016 Document Reviewed: 01/01/2016 °Elsevier Interactive Patient Education © 2017 [employer] ° °

## 2016-07-22 ENCOUNTER — Other Ambulatory Visit: Payer: Medicare Other

## 2016-07-22 LAB — TESTOSTERONE: Testosterone, Serum: 1250 ng/dL — ABNORMAL HIGH (ref 264–916)

## 2016-07-22 LAB — IGG, IGA, IGM
IgA, Qn, Serum: 302 mg/dL (ref 61–437)
IgG, Qn, Serum: 1855 mg/dL — ABNORMAL HIGH (ref 700–1600)
IgM, Qn, Serum: 62 mg/dL (ref 20–172)

## 2016-07-22 LAB — KAPPA/LAMBDA LIGHT CHAINS
Ig Kappa Free Light Chain: 41.7 mg/L — ABNORMAL HIGH (ref 3.3–19.4)
Ig Lambda Free Light Chain: 29 mg/L — ABNORMAL HIGH (ref 5.7–26.3)
KAPPA/LAMBDA FLC RATIO: 1.44 (ref 0.26–1.65)

## 2016-07-22 LAB — VITAMIN D 25 HYDROXY (VIT D DEFICIENCY, FRACTURES): VIT D 25 HYDROXY: 34.8 ng/mL (ref 30.0–100.0)

## 2016-07-23 LAB — PROTEIN ELECTROPHORESIS, SERUM, WITH REFLEX
A/G Ratio: 0.8 (ref 0.7–1.7)
ALPHA 1: 0.2 g/dL (ref 0.0–0.4)
ALPHA 2: 1.1 g/dL — AB (ref 0.4–1.0)
Albumin: 3.4 g/dL (ref 2.9–4.4)
BETA: 1.1 g/dL (ref 0.7–1.3)
Gamma Globulin: 1.9 g/dL — ABNORMAL HIGH (ref 0.4–1.8)
Globulin, Total: 4.3 g/dL — ABNORMAL HIGH (ref 2.2–3.9)
INTERPRETATION(SEE BELOW): 0
M-Spike, %: 1.2 g/dL — ABNORMAL HIGH
Total Protein: 7.7 g/dL (ref 6.0–8.5)

## 2016-07-24 ENCOUNTER — Other Ambulatory Visit: Payer: Self-pay | Admitting: *Deleted

## 2016-07-24 DIAGNOSIS — C9001 Multiple myeloma in remission: Secondary | ICD-10-CM

## 2016-07-27 ENCOUNTER — Ambulatory Visit (HOSPITAL_BASED_OUTPATIENT_CLINIC_OR_DEPARTMENT_OTHER): Payer: Medicare Other

## 2016-07-27 ENCOUNTER — Ambulatory Visit: Payer: Medicare Other

## 2016-07-27 ENCOUNTER — Other Ambulatory Visit: Payer: Medicare Other

## 2016-07-27 DIAGNOSIS — E291 Testicular hypofunction: Secondary | ICD-10-CM

## 2016-07-27 DIAGNOSIS — E349 Endocrine disorder, unspecified: Secondary | ICD-10-CM

## 2016-07-27 DIAGNOSIS — C9001 Multiple myeloma in remission: Secondary | ICD-10-CM

## 2016-07-27 MED ORDER — TESTOSTERONE CYPIONATE 200 MG/ML IM SOLN
INTRAMUSCULAR | Status: AC
  Filled 2016-07-27: qty 2

## 2016-07-27 MED ORDER — TESTOSTERONE CYPIONATE 200 MG/ML IM SOLN
300.0000 mg | INTRAMUSCULAR | Status: DC
  Administered 2016-07-27: 300 mg via INTRAMUSCULAR

## 2016-07-27 NOTE — Patient Instructions (Signed)
Testosterone injection What is this medicine? TESTOSTERONE (tes TOS ter one) is the main male hormone. It supports normal male development such as muscle growth, facial hair, and deep voice. It is used in males to treat low testosterone levels. This medicine may be used for other purposes; ask your health care provider or pharmacist if you have questions. COMMON BRAND NAME(S): Andro-L.A., Aveed, Delatestryl, Depo-Testosterone, Virilon What should I tell my health care provider before I take this medicine? They need to know if you have any of these conditions: -cancer -diabetes -heart disease -kidney disease -liver disease -lung disease -prostate disease -an unusual or allergic reaction to testosterone, other medicines, foods, dyes, or preservatives -pregnant or trying to get pregnant -breast-feeding How should I use this medicine? This medicine is for injection into a muscle. It is usually given by a health care professional in a hospital or clinic setting. Contact your pediatrician regarding the use of this medicine in children. While this medicine may be prescribed for children as young as 12 years of age for selected conditions, precautions do apply. Overdosage: If you think you have taken too much of this medicine contact a poison control center or emergency room at once. NOTE: This medicine is only for you. Do not share this medicine with others. What if I miss a dose? Try not to miss a dose. Your doctor or health care professional will tell you when your next injection is due. Notify the office if you are unable to keep an appointment. What may interact with this medicine? -medicines for diabetes -medicines that treat or prevent blood clots like warfarin -oxyphenbutazone -propranolol -steroid medicines like prednisone or cortisone This list may not describe all possible interactions. Give your health care provider a list of all the medicines, herbs, non-prescription drugs, or  dietary supplements you use. Also tell them if you smoke, drink alcohol, or use illegal drugs. Some items may interact with your medicine. What should I watch for while using this medicine? Visit your doctor or health care professional for regular checks on your progress. They will need to check the level of testosterone in your blood. This medicine is only approved for use in men who have low levels of testosterone related to certain medical conditions. Heart attacks and strokes have been reported with the use of this medicine. Notify your doctor or health care professional and seek emergency treatment if you develop breathing problems; changes in vision; confusion; chest pain or chest tightness; sudden arm pain; severe, sudden headache; trouble speaking or understanding; sudden numbness or weakness of the face, arm or leg; loss of balance or coordination. Talk to your doctor about the risks and benefits of this medicine. This medicine may affect blood sugar levels. If you have diabetes, check with your doctor or health care professional before you change your diet or the dose of your diabetic medicine. Testosterone injections are not commonly used in women. Women should inform their doctor if they wish to become pregnant or think they might be pregnant. There is a potential for serious side effects to an unborn child. Talk to your health care professional or pharmacist for more information. Talk with your doctor or health care professional about your birth control options while taking this medicine. This drug is banned from use in athletes by most athletic organizations. What side effects may I notice from receiving this medicine? Side effects that you should report to your doctor or health care professional as soon as possible: -allergic reactions like skin rash,   itching or hives, swelling of the face, lips, or tongue -breast enlargement -breathing problems -changes in emotions or moods -deep or  hoarse voice -irregular menstrual periods -signs and symptoms of liver injury like dark yellow or brown urine; general ill feeling or flu-like symptoms; light-colored stools; loss of appetite; nausea; right upper belly pain; unusually weak or tired; yellowing of the eyes or skin -stomach pain -swelling of the ankles, feet, hands -too frequent or persistent erections -trouble passing urine or change in the amount of urine Side effects that usually do not require medical attention (report to your doctor or health care professional if they continue or are bothersome): -acne -change in sex drive or performance -facial hair growth -hair loss -headache This list may not describe all possible side effects. Call your doctor for medical advice about side effects. You may report side effects to FDA at 1-800-FDA-1088. Where should I keep my medicine? Keep out of the reach of children. This medicine can be abused. Keep your medicine in a safe place to protect it from theft. Do not share this medicine with anyone. Selling or giving away this medicine is dangerous and against the law. Store at room temperature between 20 and 25 degrees C (68 and 77 degrees F). Do not freeze. Protect from light. Follow the directions for the product you are prescribed. Throw away any unused medicine after the expiration date. NOTE: This sheet is a summary. It may not cover all possible information. If you have questions about this medicine, talk to your doctor, pharmacist, or health care provider.  2018 Elsevier/Gold Standard (2015-03-16 07:33:55)  

## 2016-08-04 ENCOUNTER — Other Ambulatory Visit: Payer: Medicare Other

## 2016-08-04 ENCOUNTER — Ambulatory Visit: Payer: Medicare Other

## 2016-08-10 ENCOUNTER — Other Ambulatory Visit: Payer: Medicare Other

## 2016-08-10 ENCOUNTER — Ambulatory Visit (HOSPITAL_BASED_OUTPATIENT_CLINIC_OR_DEPARTMENT_OTHER): Payer: Medicare Other

## 2016-08-10 VITALS — BP 134/65 | HR 70 | Temp 98.2°F | Resp 17

## 2016-08-10 DIAGNOSIS — E349 Endocrine disorder, unspecified: Secondary | ICD-10-CM

## 2016-08-10 MED ORDER — TESTOSTERONE CYPIONATE 200 MG/ML IM SOLN
INTRAMUSCULAR | Status: AC
  Filled 2016-08-10: qty 2

## 2016-08-10 MED ORDER — TESTOSTERONE CYPIONATE 200 MG/ML IM SOLN
300.0000 mg | INTRAMUSCULAR | Status: DC
  Administered 2016-08-10: 300 mg via INTRAMUSCULAR

## 2016-08-21 ENCOUNTER — Other Ambulatory Visit: Payer: Self-pay | Admitting: *Deleted

## 2016-08-21 DIAGNOSIS — M2550 Pain in unspecified joint: Secondary | ICD-10-CM

## 2016-08-21 DIAGNOSIS — M4712 Other spondylosis with myelopathy, cervical region: Secondary | ICD-10-CM

## 2016-08-21 DIAGNOSIS — E349 Endocrine disorder, unspecified: Secondary | ICD-10-CM

## 2016-08-21 DIAGNOSIS — C9000 Multiple myeloma not having achieved remission: Secondary | ICD-10-CM

## 2016-08-21 DIAGNOSIS — E559 Vitamin D deficiency, unspecified: Secondary | ICD-10-CM

## 2016-08-21 DIAGNOSIS — D508 Other iron deficiency anemias: Secondary | ICD-10-CM

## 2016-08-24 ENCOUNTER — Ambulatory Visit: Payer: Medicare Other

## 2016-08-24 ENCOUNTER — Other Ambulatory Visit (HOSPITAL_BASED_OUTPATIENT_CLINIC_OR_DEPARTMENT_OTHER): Payer: Medicare Other

## 2016-08-24 ENCOUNTER — Ambulatory Visit (HOSPITAL_BASED_OUTPATIENT_CLINIC_OR_DEPARTMENT_OTHER): Payer: Medicare Other

## 2016-08-24 ENCOUNTER — Ambulatory Visit (HOSPITAL_BASED_OUTPATIENT_CLINIC_OR_DEPARTMENT_OTHER): Payer: Medicare Other | Admitting: Hematology & Oncology

## 2016-08-24 VITALS — BP 144/88 | HR 79 | Temp 98.3°F | Resp 17 | Wt 202.0 lb

## 2016-08-24 DIAGNOSIS — C9000 Multiple myeloma not having achieved remission: Secondary | ICD-10-CM | POA: Diagnosis present

## 2016-08-24 DIAGNOSIS — D631 Anemia in chronic kidney disease: Secondary | ICD-10-CM

## 2016-08-24 DIAGNOSIS — N189 Chronic kidney disease, unspecified: Secondary | ICD-10-CM | POA: Diagnosis not present

## 2016-08-24 DIAGNOSIS — D508 Other iron deficiency anemias: Secondary | ICD-10-CM

## 2016-08-24 DIAGNOSIS — E349 Endocrine disorder, unspecified: Secondary | ICD-10-CM | POA: Diagnosis not present

## 2016-08-24 DIAGNOSIS — Z95828 Presence of other vascular implants and grafts: Secondary | ICD-10-CM

## 2016-08-24 DIAGNOSIS — D509 Iron deficiency anemia, unspecified: Secondary | ICD-10-CM

## 2016-08-24 DIAGNOSIS — M4712 Other spondylosis with myelopathy, cervical region: Secondary | ICD-10-CM

## 2016-08-24 DIAGNOSIS — M2550 Pain in unspecified joint: Secondary | ICD-10-CM

## 2016-08-24 DIAGNOSIS — Z299 Encounter for prophylactic measures, unspecified: Secondary | ICD-10-CM

## 2016-08-24 DIAGNOSIS — E559 Vitamin D deficiency, unspecified: Secondary | ICD-10-CM

## 2016-08-24 LAB — CBC WITH DIFFERENTIAL (CANCER CENTER ONLY)
BASO#: 0 10*3/uL (ref 0.0–0.2)
BASO%: 0.6 % (ref 0.0–2.0)
EOS%: 3.1 % (ref 0.0–7.0)
Eosinophils Absolute: 0.2 10*3/uL (ref 0.0–0.5)
HEMATOCRIT: 33.8 % — AB (ref 38.7–49.9)
HEMOGLOBIN: 11.1 g/dL — AB (ref 13.0–17.1)
LYMPH#: 1.5 10*3/uL (ref 0.9–3.3)
LYMPH%: 22.6 % (ref 14.0–48.0)
MCH: 24.7 pg — ABNORMAL LOW (ref 28.0–33.4)
MCHC: 32.8 g/dL (ref 32.0–35.9)
MCV: 75 fL — ABNORMAL LOW (ref 82–98)
MONO#: 0.9 10*3/uL (ref 0.1–0.9)
MONO%: 13.9 % — AB (ref 0.0–13.0)
NEUT%: 59.8 % (ref 40.0–80.0)
NEUTROS ABS: 4 10*3/uL (ref 1.5–6.5)
Platelets: 229 10*3/uL (ref 145–400)
RBC: 4.49 10*6/uL (ref 4.20–5.70)
RDW: 17.8 % — ABNORMAL HIGH (ref 11.1–15.7)
WBC: 6.7 10*3/uL (ref 4.0–10.0)

## 2016-08-24 LAB — IRON AND TIBC
%SAT: 32 % (ref 20–55)
IRON: 72 ug/dL (ref 42–163)
TIBC: 223 ug/dL (ref 202–409)
UIBC: 151 ug/dL (ref 117–376)

## 2016-08-24 LAB — CMP (CANCER CENTER ONLY)
ALBUMIN: 3.4 g/dL (ref 3.3–5.5)
ALK PHOS: 69 U/L (ref 26–84)
ALT: 30 U/L (ref 10–47)
AST: 24 U/L (ref 11–38)
BILIRUBIN TOTAL: 0.7 mg/dL (ref 0.20–1.60)
BUN, Bld: 16 mg/dL (ref 7–22)
CALCIUM: 8.7 mg/dL (ref 8.0–10.3)
CO2: 26 mEq/L (ref 18–33)
CREATININE: 2 mg/dL — AB (ref 0.6–1.2)
Chloride: 107 mEq/L (ref 98–108)
Glucose, Bld: 172 mg/dL — ABNORMAL HIGH (ref 73–118)
Potassium: 3.8 mEq/L (ref 3.3–4.7)
SODIUM: 136 meq/L (ref 128–145)
TOTAL PROTEIN: 7.8 g/dL (ref 6.4–8.1)

## 2016-08-24 LAB — FERRITIN

## 2016-08-24 MED ORDER — HEPARIN SOD (PORK) LOCK FLUSH 100 UNIT/ML IV SOLN
500.0000 [IU] | Freq: Once | INTRAVENOUS | Status: AC
  Administered 2016-08-24: 500 [IU] via INTRAVENOUS
  Filled 2016-08-24: qty 5

## 2016-08-24 MED ORDER — SODIUM CHLORIDE 0.9% FLUSH
10.0000 mL | INTRAVENOUS | Status: DC | PRN
  Administered 2016-08-24: 10 mL via INTRAVENOUS
  Filled 2016-08-24: qty 10

## 2016-08-24 MED ORDER — TESTOSTERONE CYPIONATE 200 MG/ML IM SOLN
300.0000 mg | INTRAMUSCULAR | Status: DC
  Administered 2016-08-24: 300 mg via INTRAMUSCULAR

## 2016-08-24 MED ORDER — TESTOSTERONE CYPIONATE 200 MG/ML IM SOLN
INTRAMUSCULAR | Status: AC
  Filled 2016-08-24: qty 2

## 2016-08-24 MED ORDER — SODIUM CHLORIDE 0.9 % IV SOLN
60.0000 mg | Freq: Once | INTRAVENOUS | Status: AC
  Administered 2016-08-24: 60 mg via INTRAVENOUS
  Filled 2016-08-24: qty 10

## 2016-08-24 MED ORDER — MORPHINE SULFATE 4 MG/ML IJ SOLN
4.0000 mg | Freq: Once | INTRAMUSCULAR | Status: AC
Start: 2016-08-24 — End: 2016-08-24
  Administered 2016-08-24: 4 mg via INTRAVENOUS
  Filled 2016-08-24: qty 1

## 2016-08-24 MED ORDER — MORPHINE SULFATE (PF) 4 MG/ML IV SOLN
INTRAVENOUS | Status: AC
  Filled 2016-08-24: qty 1

## 2016-08-24 NOTE — Patient Instructions (Signed)
Pamidronate injection What is this medicine? PAMIDRONATE (pa mi DROE nate) slows calcium loss from bones. It is used to treat high calcium blood levels from cancer or Paget's disease. It is also used to treat bone pain and prevent fractures from certain cancers that have spread to the bone. This medicine may be used for other purposes; ask your health care provider or pharmacist if you have questions. COMMON BRAND NAME(S): Aredia What should I tell my health care provider before I take this medicine? They need to know if you have any of these conditions: -aspirin-sensitive asthma -dental disease -kidney disease -an unusual or allergic reaction to pamidronate, other medicines, foods, dyes, or preservatives -pregnant or trying to get pregnant -breast-feeding How should I use this medicine? This medicine is for infusion into a vein. It is given by a health care professional in a hospital or clinic setting. Talk to your pediatrician regarding the use of this medicine in children. This medicine is not approved for use in children. Overdosage: If you think you have taken too much of this medicine contact a poison control center or emergency room at once. NOTE: This medicine is only for you. Do not share this medicine with others. What if I miss a dose? This does not apply. What may interact with this medicine? -certain antibiotics given by injection -medicines for inflammation or pain like ibuprofen, naproxen -some diuretics like bumetanide, furosemide -cyclosporine -parathyroid hormone -tacrolimus -teriparatide -thalidomide This list may not describe all possible interactions. Give your health care provider a list of all the medicines, herbs, non-prescription drugs, or dietary supplements you use. Also tell them if you smoke, drink alcohol, or use illegal drugs. Some items may interact with your medicine. What should I watch for while using this medicine? Visit your doctor or health care  professional for regular checkups. It may be some time before you see the benefit from this medicine. Do not stop taking your medicine unless your doctor tells you to. Your doctor may order blood tests or other tests to see how you are doing. Women should inform their doctor if they wish to become pregnant or think they might be pregnant. There is a potential for serious side effects to an unborn child. Talk to your health care professional or pharmacist for more information. You should make sure that you get enough calcium and vitamin D while you are taking this medicine. Discuss the foods you eat and the vitamins you take with your health care professional. Some people who take this medicine have severe bone, joint, and/or muscle pain. This medicine may also increase your risk for a broken thigh bone. Tell your doctor right away if you have pain in your upper leg or groin. Tell your doctor if you have any pain that does not go away or that gets worse. What side effects may I notice from receiving this medicine? Side effects that you should report to your doctor or health care professional as soon as possible: -allergic reactions like skin rash, itching or hives, swelling of the face, lips, or tongue -black or tarry stools -changes in vision -eye inflammation, pain -high blood pressure -jaw pain, especially burning or cramping -muscle weakness -numb, tingling pain -swelling of feet or hands -trouble passing urine or change in the amount of urine -unable to move easily Side effects that usually do not require medical attention (report to your doctor or health care professional if they continue or are bothersome): -bone, joint, or muscle pain -constipation -dizzy, drowsy -  fever -headache -loss of appetite -nausea, vomiting -pain at site where injected This list may not describe all possible side effects. Call your doctor for medical advice about side effects. You may report side effects to  FDA at 1-800-FDA-1088. Where should I keep my medicine? This drug is given in a hospital or clinic and will not be stored at home. NOTE: This sheet is a summary. It may not cover all possible information. If you have questions about this medicine, talk to your doctor, pharmacist, or health care provider.  2018 Elsevier/Gold Standard (2010-08-08 08:49:49)  

## 2016-08-24 NOTE — Progress Notes (Signed)
Hematology and Oncology Follow Up Visit  [name] [id number] 1950-12-01 66 y.o. 08/24/2016   Principle Diagnosis:  1. IgG kappa myeloma. 2. Anemia secondary to renal insufficiency. 3. Intermittent iron-deficiency anemia. 4. Hypotestosteronemia  Current Therapy:   1. Aredia 60 mg IV q.2 months  2. Aranesp 300 mcg subcu as needed for hemoglobin less than 11. 3. Depo-Testosterone 300 mg q.2 weeks. 4. IV iron as indicated.     Interim History:  Mr.  [name] is back for followup.  He did have neck fusion back on April 13. He was fused from C3-C6. This went well for what I can tell. He is having some pain issues. No specimens were sent to pathology.  His last M spike back in May was 1.2g/dL. This is holding steady. His IgG level was 1855 mg/dL. His Kappa Lightchain was 4.2 mg/dL.  His iron studies have been borderline on the lower side. He's had no bleeding. Today, his ferritin was 1323. His iron saturation 32%.  His blood sugars typically are on the higher side.  Overall, his performance status is ECOG 1.   Medications:  Current Outpatient Prescriptions:  .  amLODipine (NORVASC) 10 MG tablet, Take 10 mg by mouth daily., Disp: , Rfl:  .  CARAFATE 1 GM/10ML suspension, Take 1 g by mouth 4 (four) times daily. , Disp: , Rfl:  .  dicyclomine (BENTYL) 10 MG capsule, Take 1 capsule (10 mg total) by mouth 3 (three) times daily. (Patient not taking: Reported on 05/22/2016), Disp: 90 capsule, Rfl: 2 .  furosemide (LASIX) 40 MG tablet, Take 40 mg by mouth., Disp: , Rfl:  .  glipiZIDE (GLUCOTROL) 5 MG tablet, Take 5 mg by mouth 2 (two) times daily before a meal. , Disp: , Rfl:  .  isosorbide mononitrate (IMDUR) 30 MG 24 hr tablet, Take 30 mg by mouth daily., Disp: , Rfl:  .  latanoprost (XALATAN) 0.005 % ophthalmic solution, Place 1 drop into the right eye every evening. , Disp: , Rfl:  .  Linaclotide (LINZESS) 145 MCG CAPS capsule, Take 1 capsule (145 mcg total) by mouth daily before  breakfast., Disp: 30 capsule, Rfl: 2 .  losartan (COZAAR) 25 MG tablet, Take 25 mg by mouth., Disp: , Rfl:  .  meclizine (ANTIVERT) 50 MG tablet, Take 0.5 tablets (25 mg total) by mouth 4 (four) times daily as needed for dizziness., Disp: 30 tablet, Rfl: 0 .  methocarbamol (ROBAXIN) 500 MG tablet, Take 500 mg by mouth 4 (four) times daily., Disp: , Rfl:  .  metoprolol succinate (TOPROL XL) 50 MG 24 hr tablet, Take 50 mg by mouth daily. , Disp: , Rfl:  .  mirabegron ER (MYRBETRIQ) 50 MG TB24 tablet, Take 50 mg by mouth daily., Disp: , Rfl:  .  Morphine-Naltrexone (EMBEDA) 20-0.8 MG CPCR, Take 1 capsule by mouth 3 (three) times daily., Disp: , Rfl:  .  omeprazole (PRILOSEC) 40 MG capsule, Take 1 capsule (40 mg total) by mouth 2 (two) times daily., Disp: 60 capsule, Rfl: 5 .  ondansetron (ZOFRAN ODT) 4 MG disintegrating tablet, Take 1 tablet (4 mg total) by mouth every 8 (eight) hours as needed for nausea or vomiting., Disp: 10 tablet, Rfl: 0 .  orphenadrine (NORFLEX) 100 MG tablet, Take 1 tablet (100 mg total) by mouth 2 (two) times daily. (Patient not taking: Reported on 05/28/2016), Disp: 30 tablet, Rfl: 0 .  oxyCODONE (OXYCONTIN) 20 mg 12 hr tablet, Take 1 tablet (20 mg total) by mouth every 12 (  twelve) hours., Disp: 14 tablet, Rfl: 0 .  oxyCODONE-acetaminophen (PERCOCET) 10-325 MG tablet, Take 1 tablet by mouth every 6 (six) hours as needed for pain. (Patient not taking: Reported on 06/16/2016), Disp: 30 tablet, Rfl: 0 .  oxyCODONE-acetaminophen (PERCOCET) 10-325 MG tablet, Take 1 tablet by mouth every 6 (six) hours as needed for pain., Disp: 60 tablet, Rfl: 0 .  Polyvinyl Alcohol-Povidone (CLEAR EYES ALL SEASONS) 5-6 MG/ML SOLN, Place 2 drops into both eyes 3 (three) times daily as needed (drye eyes.). , Disp: , Rfl:  .  potassium chloride SA (K-DUR,KLOR-CON) 20 MEQ tablet, Take 1 tablet (20 mEq total) by mouth 2 (two) times daily. (Patient not taking: Reported on 05/22/2016), Disp: 60 tablet, Rfl: 6 .   prochlorperazine (COMPAZINE) 10 MG tablet, Take 1 tablet (10 mg total) by mouth every 6 (six) hours as needed for nausea or vomiting. (Patient not taking: Reported on 05/22/2016), Disp: 60 tablet, Rfl: 2 .  Tafluprost (ZIOPTAN) 0.0015 % SOLN, Place 1 drop into both eyes daily. , Disp: , Rfl:  .  Vitamin D, Ergocalciferol, (DRISDOL) 50000 UNITS CAPS capsule, Take 50,000 Units by mouth every Saturday. , Disp: , Rfl:  No current facility-administered medications for this visit.   Facility-Administered Medications Ordered in Other Visits:  .  morphine 4 MG/ML injection 4 mg, 4 mg, Intravenous, Q4H PRN, Volanda Napoleon, MD  Allergies:  Allergies  Allergen Reactions  . Iodine Anxiety and Rash    Didn't feel right "instructed not to take per MD--something with his port"    Past Medical History, Surgical history, Social history, and Family History were reviewed and updated.  Review of Systems: As above  Physical Exam:  weight is 202 lb (91.6 kg). His oral temperature is 98.3 F (36.8 C). His blood pressure is 144/88 (abnormal) and his pulse is 79. His respiration is 17 and oxygen saturation is 100%.   His head and neck exam shows no ocular or oral lesions. He has no adenopathy in the neck. His thyroid is non-palpable. Lungs are clear. Cardiac exam regular rate and rhythm with no murmurs, rubs or bruits.. Abdomen is soft. His abdomen is slightly distended. He has good bowel sounds. There is no fluid wave. There is no palpable liver or spleen tip. Back exam shows no tenderness in the lumbar spine. There is no paravertebral muscle spasms. . He has slightly decreased range of motion of the lower back. Extremities shows no clubbing cyanosis or edema.He does have a swelling in the posterior right shoulder. There is some tenderness in this area to palpation. He has fairly decent range of motion of the right arm. He has decent strength in his legs. Skin exam shows no rashes. Neurological exam is  nonfocal.  Lab Results  Component Value Date   WBC 6.7 08/24/2016   HGB 11.1 (L) 08/24/2016   HCT 33.8 (L) 08/24/2016   MCV 75 (L) 08/24/2016   PLT 229 08/24/2016     Chemistry      Component Value Date/Time   NA 136 08/24/2016 0757   K 3.8 08/24/2016 0757   CL 107 08/24/2016 0757   CO2 26 08/24/2016 0757   BUN 16 08/24/2016 0757   CREATININE 2.0 (H) 08/24/2016 0757      Component Value Date/Time   CALCIUM 8.7 08/24/2016 0757   ALKPHOS 69 08/24/2016 0757   AST 24 08/24/2016 0757   ALT 30 08/24/2016 0757   BILITOT 0.70 08/24/2016 0757  Impression and Plan: Mr. Dieujuste is a 66 year old gentleman. He has multiple medical problems.   As always, he has chronic pain issues. This is nothing to do with his smoldering myeloma. In fact, the myeloma looks to be holding pretty steady.  We will go ahead and give him his Aredia today.   He comes in every 2 weeks for his testosterone.   I'll see him back in 2 months.  Volanda Napoleon, MD 7/2/20186:10 PM

## 2016-08-25 LAB — KAPPA/LAMBDA LIGHT CHAINS
IG LAMBDA FREE LIGHT CHAIN: 22.7 mg/L (ref 5.7–26.3)
Ig Kappa Free Light Chain: 31 mg/L — ABNORMAL HIGH (ref 3.3–19.4)
Kappa/Lambda FluidC Ratio: 1.37 (ref 0.26–1.65)

## 2016-08-25 LAB — VITAMIN D 25 HYDROXY (VIT D DEFICIENCY, FRACTURES): VIT D 25 HYDROXY: 32.7 ng/mL (ref 30.0–100.0)

## 2016-08-25 LAB — IGG, IGA, IGM
IGA/IMMUNOGLOBULIN A, SERUM: 277 mg/dL (ref 61–437)
IgM, Qn, Serum: 59 mg/dL (ref 20–172)

## 2016-08-25 LAB — TESTOSTERONE: Testosterone, Serum: 821 ng/dL (ref 264–916)

## 2016-08-27 LAB — PROTEIN ELECTROPHORESIS, SERUM, WITH REFLEX
A/G Ratio: 0.9 (ref 0.7–1.7)
ALBUMIN: 3.5 g/dL (ref 2.9–4.4)
Alpha 1: 0.2 g/dL (ref 0.0–0.4)
Alpha 2: 1 g/dL (ref 0.4–1.0)
Beta: 0.9 g/dL (ref 0.7–1.3)
GLOBULIN, TOTAL: 3.9 g/dL (ref 2.2–3.9)
Gamma Globulin: 1.8 g/dL (ref 0.4–1.8)
INTERPRETATION(SEE BELOW): 0
M-Spike, %: 1 g/dL — ABNORMAL HIGH
TOTAL PROTEIN: 7.4 g/dL (ref 6.0–8.5)

## 2016-09-07 ENCOUNTER — Other Ambulatory Visit: Payer: Medicare Other

## 2016-09-07 ENCOUNTER — Ambulatory Visit (HOSPITAL_BASED_OUTPATIENT_CLINIC_OR_DEPARTMENT_OTHER): Payer: Medicare Other

## 2016-09-07 VITALS — BP 143/62 | HR 65 | Temp 98.4°F | Resp 20

## 2016-09-07 DIAGNOSIS — E349 Endocrine disorder, unspecified: Secondary | ICD-10-CM

## 2016-09-07 MED ORDER — TESTOSTERONE CYPIONATE 200 MG/ML IM SOLN
300.0000 mg | INTRAMUSCULAR | Status: DC
  Administered 2016-09-07: 300 mg via INTRAMUSCULAR

## 2016-09-07 MED ORDER — TESTOSTERONE CYPIONATE 200 MG/ML IM SOLN
INTRAMUSCULAR | Status: AC
  Filled 2016-09-07: qty 2

## 2016-09-21 ENCOUNTER — Ambulatory Visit (HOSPITAL_BASED_OUTPATIENT_CLINIC_OR_DEPARTMENT_OTHER): Payer: Medicare Other | Admitting: Hematology & Oncology

## 2016-09-21 ENCOUNTER — Other Ambulatory Visit (HOSPITAL_BASED_OUTPATIENT_CLINIC_OR_DEPARTMENT_OTHER): Payer: Medicare Other

## 2016-09-21 ENCOUNTER — Ambulatory Visit (HOSPITAL_BASED_OUTPATIENT_CLINIC_OR_DEPARTMENT_OTHER): Payer: Medicare Other

## 2016-09-21 ENCOUNTER — Ambulatory Visit: Payer: Medicare Other

## 2016-09-21 VITALS — BP 150/72 | HR 74 | Temp 98.1°F | Resp 20

## 2016-09-21 DIAGNOSIS — N189 Chronic kidney disease, unspecified: Secondary | ICD-10-CM

## 2016-09-21 DIAGNOSIS — E349 Endocrine disorder, unspecified: Secondary | ICD-10-CM

## 2016-09-21 DIAGNOSIS — D508 Other iron deficiency anemias: Secondary | ICD-10-CM

## 2016-09-21 DIAGNOSIS — G8929 Other chronic pain: Secondary | ICD-10-CM

## 2016-09-21 DIAGNOSIS — D631 Anemia in chronic kidney disease: Secondary | ICD-10-CM

## 2016-09-21 DIAGNOSIS — C9000 Multiple myeloma not having achieved remission: Secondary | ICD-10-CM

## 2016-09-21 DIAGNOSIS — L989 Disorder of the skin and subcutaneous tissue, unspecified: Secondary | ICD-10-CM | POA: Diagnosis not present

## 2016-09-21 LAB — CMP (CANCER CENTER ONLY)
ALT(SGPT): 19 U/L (ref 10–47)
AST: 24 U/L (ref 11–38)
Albumin: 3.6 g/dL (ref 3.3–5.5)
Alkaline Phosphatase: 79 U/L (ref 26–84)
BUN: 31 mg/dL — AB (ref 7–22)
CHLORIDE: 96 meq/L — AB (ref 98–108)
CO2: 28 mEq/L (ref 18–33)
Calcium: 9 mg/dL (ref 8.0–10.3)
Creat: 2.7 mg/dl — ABNORMAL HIGH (ref 0.6–1.2)
GLUCOSE: 134 mg/dL — AB (ref 73–118)
POTASSIUM: 3.9 meq/L (ref 3.3–4.7)
Sodium: 135 mEq/L (ref 128–145)
Total Bilirubin: 0.5 mg/dl (ref 0.20–1.60)
Total Protein: 8.5 g/dL — ABNORMAL HIGH (ref 6.4–8.1)

## 2016-09-21 LAB — CBC WITH DIFFERENTIAL (CANCER CENTER ONLY)
BASO#: 0.1 10*3/uL (ref 0.0–0.2)
BASO%: 0.8 % (ref 0.0–2.0)
EOS ABS: 0.3 10*3/uL (ref 0.0–0.5)
EOS%: 3.9 % (ref 0.0–7.0)
HCT: 37.7 % — ABNORMAL LOW (ref 38.7–49.9)
HEMOGLOBIN: 12.5 g/dL — AB (ref 13.0–17.1)
LYMPH#: 1.7 10*3/uL (ref 0.9–3.3)
LYMPH%: 23.6 % (ref 14.0–48.0)
MCH: 24.8 pg — AB (ref 28.0–33.4)
MCHC: 33.2 g/dL (ref 32.0–35.9)
MCV: 75 fL — AB (ref 82–98)
MONO#: 0.7 10*3/uL (ref 0.1–0.9)
MONO%: 9.9 % (ref 0.0–13.0)
NEUT#: 4.5 10*3/uL (ref 1.5–6.5)
NEUT%: 61.8 % (ref 40.0–80.0)
PLATELETS: 246 10*3/uL (ref 145–400)
RBC: 5.05 10*6/uL (ref 4.20–5.70)
RDW: 17.3 % — AB (ref 11.1–15.7)
WBC: 7.3 10*3/uL (ref 4.0–10.0)

## 2016-09-21 LAB — IRON AND TIBC
%SAT: 23 % (ref 20–55)
Iron: 57 ug/dL (ref 42–163)
TIBC: 248 ug/dL (ref 202–409)
UIBC: 191 ug/dL (ref 117–376)

## 2016-09-21 LAB — FERRITIN: Ferritin: 1221 ng/ml — ABNORMAL HIGH (ref 22–316)

## 2016-09-21 MED ORDER — HEPARIN SOD (PORK) LOCK FLUSH 100 UNIT/ML IV SOLN
500.0000 [IU] | Freq: Once | INTRAVENOUS | Status: DC
  Filled 2016-09-21: qty 5

## 2016-09-21 MED ORDER — TESTOSTERONE CYPIONATE 200 MG/ML IM SOLN
INTRAMUSCULAR | Status: AC
  Filled 2016-09-21: qty 2

## 2016-09-21 MED ORDER — SODIUM CHLORIDE 0.9% FLUSH
10.0000 mL | INTRAVENOUS | Status: DC | PRN
  Filled 2016-09-21: qty 10

## 2016-09-21 MED ORDER — TESTOSTERONE CYPIONATE 200 MG/ML IM SOLN
300.0000 mg | INTRAMUSCULAR | Status: DC
  Administered 2016-09-21: 300 mg via INTRAMUSCULAR

## 2016-09-21 NOTE — Patient Instructions (Signed)
Testosterone injection What is this medicine? TESTOSTERONE (tes TOS ter one) is the main male hormone. It supports normal male development such as muscle growth, facial hair, and deep voice. It is used in males to treat low testosterone levels. This medicine may be used for other purposes; ask your health care provider or pharmacist if you have questions. COMMON BRAND NAME(S): Andro-L.A., Aveed, Delatestryl, Depo-Testosterone, Virilon What should I tell my health care provider before I take this medicine? They need to know if you have any of these conditions: -cancer -diabetes -heart disease -kidney disease -liver disease -lung disease -prostate disease -an unusual or allergic reaction to testosterone, other medicines, foods, dyes, or preservatives -pregnant or trying to get pregnant -breast-feeding How should I use this medicine? This medicine is for injection into a muscle. It is usually given by a health care professional in a hospital or clinic setting. Contact your pediatrician regarding the use of this medicine in children. While this medicine may be prescribed for children as young as 12 years of age for selected conditions, precautions do apply. Overdosage: If you think you have taken too much of this medicine contact a poison control center or emergency room at once. NOTE: This medicine is only for you. Do not share this medicine with others. What if I miss a dose? Try not to miss a dose. Your doctor or health care professional will tell you when your next injection is due. Notify the office if you are unable to keep an appointment. What may interact with this medicine? -medicines for diabetes -medicines that treat or prevent blood clots like warfarin -oxyphenbutazone -propranolol -steroid medicines like prednisone or cortisone This list may not describe all possible interactions. Give your health care provider a list of all the medicines, herbs, non-prescription drugs, or  dietary supplements you use. Also tell them if you smoke, drink alcohol, or use illegal drugs. Some items may interact with your medicine. What should I watch for while using this medicine? Visit your doctor or health care professional for regular checks on your progress. They will need to check the level of testosterone in your blood. This medicine is only approved for use in men who have low levels of testosterone related to certain medical conditions. Heart attacks and strokes have been reported with the use of this medicine. Notify your doctor or health care professional and seek emergency treatment if you develop breathing problems; changes in vision; confusion; chest pain or chest tightness; sudden arm pain; severe, sudden headache; trouble speaking or understanding; sudden numbness or weakness of the face, arm or leg; loss of balance or coordination. Talk to your doctor about the risks and benefits of this medicine. This medicine may affect blood sugar levels. If you have diabetes, check with your doctor or health care professional before you change your diet or the dose of your diabetic medicine. Testosterone injections are not commonly used in women. Women should inform their doctor if they wish to become pregnant or think they might be pregnant. There is a potential for serious side effects to an unborn child. Talk to your health care professional or pharmacist for more information. Talk with your doctor or health care professional about your birth control options while taking this medicine. This drug is banned from use in athletes by most athletic organizations. What side effects may I notice from receiving this medicine? Side effects that you should report to your doctor or health care professional as soon as possible: -allergic reactions like skin rash,   itching or hives, swelling of the face, lips, or tongue -breast enlargement -breathing problems -changes in emotions or moods -deep or  hoarse voice -irregular menstrual periods -signs and symptoms of liver injury like dark yellow or brown urine; general ill feeling or flu-like symptoms; light-colored stools; loss of appetite; nausea; right upper belly pain; unusually weak or tired; yellowing of the eyes or skin -stomach pain -swelling of the ankles, feet, hands -too frequent or persistent erections -trouble passing urine or change in the amount of urine Side effects that usually do not require medical attention (report to your doctor or health care professional if they continue or are bothersome): -acne -change in sex drive or performance -facial hair growth -hair loss -headache This list may not describe all possible side effects. Call your doctor for medical advice about side effects. You may report side effects to FDA at 1-800-FDA-1088. Where should I keep my medicine? Keep out of the reach of children. This medicine can be abused. Keep your medicine in a safe place to protect it from theft. Do not share this medicine with anyone. Selling or giving away this medicine is dangerous and against the law. Store at room temperature between 20 and 25 degrees C (68 and 77 degrees F). Do not freeze. Protect from light. Follow the directions for the product you are prescribed. Throw away any unused medicine after the expiration date. NOTE: This sheet is a summary. It may not cover all possible information. If you have questions about this medicine, talk to your doctor, pharmacist, or health care provider.  2018 Elsevier/Gold Standard (2015-03-16 07:33:55)  

## 2016-09-21 NOTE — Progress Notes (Signed)
PAC was attempted 3 times to access by 3 nurses; it has an edematous pocket just above the port that is difficult to palpate exactly the PAC site, no surrounding edema. When port was accessed each time it bled back into the tubing about a ml of blood immediately and would never flush with patient experiencing discomfort. MD made aware and patient had labs drawn peripherally

## 2016-09-21 NOTE — Progress Notes (Signed)
Hematology and Oncology Follow Up Visit  [name] [id number] 07-01-1950 66 y.o. 09/21/2016   Principle Diagnosis:  1. IgG kappa myeloma. 2. Anemia secondary to renal insufficiency. 3. Intermittent iron-deficiency anemia. 4. Hypotestosteronemia  Current Therapy:   1. Aredia 60 mg IV q.2 months  2. Aranesp 300 mcg subcu as needed for hemoglobin less than 11. 3. Depo-Testosterone 300 mg q.2 weeks. 4. IV iron as indicated.     Interim History:  [name] is back for followup.  He is not having a good day. His Port-A-Cath is not working. The nurses did a great job in trying to get the Port-A-Cath to access and give blood. Nothing worked. We will have to see if interventional radiology will help.   He also has this painful nodule on his left anterior chest wall. He's had this for a few weeks. He thinks it might be getting a little bit bigger. When I examined it, it was firm. It was somewhat tender. It probably measured about 3-4 mm in diameter.   His myeloma studies have been doing quite well. His last myeloma studies back in early July showed an M spike of 1.0 g/dL. His IgG level was 1786 milligrams per deciliter. His Kappa Lightchain was 3.1 Mg/DL.   He's had no fever. His blood sugars have been doing pretty well.   He's had a lot of bony pain. He's had a lot of neck pain.   His last testosterone level was 8 21 ng/dL.  Overall, his performance status is ECOG 1.   Medications:  Current Outpatient Prescriptions:  .  amLODipine (NORVASC) 10 MG tablet, Take 10 mg by mouth daily., Disp: , Rfl:  .  CARAFATE 1 GM/10ML suspension, Take 1 g by mouth 4 (four) times daily. , Disp: , Rfl:  .  dicyclomine (BENTYL) 10 MG capsule, Take 1 capsule (10 mg total) by mouth 3 (three) times daily. (Patient not taking: Reported on 05/22/2016), Disp: 90 capsule, Rfl: 2 .  furosemide (LASIX) 40 MG tablet, Take 40 mg by mouth., Disp: , Rfl:  .  glipiZIDE (GLUCOTROL) 5 MG tablet, Take 5 mg by mouth 2  (two) times daily before a meal. , Disp: , Rfl:  .  isosorbide mononitrate (IMDUR) 30 MG 24 hr tablet, Take 30 mg by mouth daily., Disp: , Rfl:  .  latanoprost (XALATAN) 0.005 % ophthalmic solution, Place 1 drop into the right eye every evening. , Disp: , Rfl:  .  Linaclotide (LINZESS) 145 MCG CAPS capsule, Take 1 capsule (145 mcg total) by mouth daily before breakfast., Disp: 30 capsule, Rfl: 2 .  losartan (COZAAR) 25 MG tablet, Take 25 mg by mouth., Disp: , Rfl:  .  meclizine (ANTIVERT) 50 MG tablet, Take 0.5 tablets (25 mg total) by mouth 4 (four) times daily as needed for dizziness., Disp: 30 tablet, Rfl: 0 .  methocarbamol (ROBAXIN) 500 MG tablet, Take 500 mg by mouth 4 (four) times daily., Disp: , Rfl:  .  metoprolol succinate (TOPROL XL) 50 MG 24 hr tablet, Take 50 mg by mouth daily. , Disp: , Rfl:  .  mirabegron ER (MYRBETRIQ) 50 MG TB24 tablet, Take 50 mg by mouth daily., Disp: , Rfl:  .  Morphine-Naltrexone (EMBEDA) 20-0.8 MG CPCR, Take 1 capsule by mouth 3 (three) times daily., Disp: , Rfl:  .  omeprazole (PRILOSEC) 40 MG capsule, Take 1 capsule (40 mg total) by mouth 2 (two) times daily., Disp: 60 capsule, Rfl: 5 .  ondansetron (ZOFRAN ODT)  4 MG disintegrating tablet, Take 1 tablet (4 mg total) by mouth every 8 (eight) hours as needed for nausea or vomiting., Disp: 10 tablet, Rfl: 0 .  orphenadrine (NORFLEX) 100 MG tablet, Take 1 tablet (100 mg total) by mouth 2 (two) times daily. (Patient not taking: Reported on 05/28/2016), Disp: 30 tablet, Rfl: 0 .  oxyCODONE (OXYCONTIN) 20 mg 12 hr tablet, Take 1 tablet (20 mg total) by mouth every 12 (twelve) hours., Disp: 14 tablet, Rfl: 0 .  oxyCODONE-acetaminophen (PERCOCET) 10-325 MG tablet, Take 1 tablet by mouth every 6 (six) hours as needed for pain. (Patient not taking: Reported on 06/16/2016), Disp: 30 tablet, Rfl: 0 .  oxyCODONE-acetaminophen (PERCOCET) 10-325 MG tablet, Take 1 tablet by mouth every 6 (six) hours as needed for pain., Disp: 60  tablet, Rfl: 0 .  Polyvinyl Alcohol-Povidone (CLEAR EYES ALL SEASONS) 5-6 MG/ML SOLN, Place 2 drops into both eyes 3 (three) times daily as needed (drye eyes.). , Disp: , Rfl:  .  potassium chloride SA (K-DUR,KLOR-CON) 20 MEQ tablet, Take 1 tablet (20 mEq total) by mouth 2 (two) times daily. (Patient not taking: Reported on 05/22/2016), Disp: 60 tablet, Rfl: 6 .  prochlorperazine (COMPAZINE) 10 MG tablet, Take 1 tablet (10 mg total) by mouth every 6 (six) hours as needed for nausea or vomiting. (Patient not taking: Reported on 05/22/2016), Disp: 60 tablet, Rfl: 2 .  Tafluprost (ZIOPTAN) 0.0015 % SOLN, Place 1 drop into both eyes daily. , Disp: , Rfl:  .  Vitamin D, Ergocalciferol, (DRISDOL) 50000 UNITS CAPS capsule, Take 50,000 Units by mouth every Saturday. , Disp: , Rfl:  No current facility-administered medications for this visit.   Facility-Administered Medications Ordered in Other Visits:  .  morphine 4 MG/ML injection 4 mg, 4 mg, Intravenous, Q4H PRN, Ennever, Peter R, MD .  sodium chloride flush (NS) 0.9 % injection 10 mL, 10 mL, Intravenous, PRN, [place], Holli Humbles, NP, 10 mL at 09/21/16 0836 .  testosterone cypionate (DEPOTESTOSTERONE CYPIONATE) injection 300 mg, 300 mg, Intramuscular, Q14 Days, Volanda Napoleon, MD, 300 mg at 09/21/16 6659  Allergies:  Allergies  Allergen Reactions  . Iodine Anxiety and Rash    Didn't feel right "instructed not to take per MD--something with his port"    Past Medical History, Surgical history, Social history, and Family History were reviewed and updated.  Review of Systems: As above  Physical Exam:  oral temperature is 98.1 F (36.7 C). His blood pressure is 150/72 (abnormal) and his pulse is 74. His respiration is 20 and oxygen saturation is 99%.   His head and neck exam shows no ocular or oral lesions. He has no adenopathy in the neck. His thyroid is non-palpable. Lungs are clear. Cardiac exam regular rate and rhythm with no murmurs, rubs or  bruits.. Abdomen is soft. His abdomen is slightly distended. He has good bowel sounds. There is no fluid wave. There is no palpable liver or spleen tip. Back exam shows no tenderness in the lumbar spine. There is no paravertebral muscle spasms. . He has slightly decreased range of motion of the lower back. Extremities shows no clubbing cyanosis or edema.He does have a swelling in the posterior right shoulder. There is some tenderness in this area to palpation. He has fairly decent range of motion of the right arm. He has decent strength in his legs. Skin exam shows no rashes. Neurological exam is nonfocal.  Lab Results  Component Value Date   WBC 6.7 08/24/2016  HGB 11.1 (L) 08/24/2016   HCT 33.8 (L) 08/24/2016   MCV 75 (L) 08/24/2016   PLT 229 08/24/2016     Chemistry      Component Value Date/Time   NA 136 08/24/2016 0757   K 3.8 08/24/2016 0757   CL 107 08/24/2016 0757   CO2 26 08/24/2016 0757   BUN 16 08/24/2016 0757   CREATININE 2.0 (H) 08/24/2016 0757      Component Value Date/Time   CALCIUM 8.7 08/24/2016 0757   ALKPHOS 69 08/24/2016 0757   AST 24 08/24/2016 0757   ALT 30 08/24/2016 0757   BILITOT 0.70 08/24/2016 0757         Impression and Plan: Mr. Manninen is a 66 year old gentleman. He has multiple medical problems.   As always, he has chronic pain issues. This is nothing to do with his smoldering myeloma. In fact, the myeloma looks to be holding pretty steady.Some of the pain might be from his neck surgery. This was 3 months ago.  He comes in every 2 weeks for his testosterone.   We will get him back in 3 weeks. He'll get his Aredia only see him back.  I will make the referral to dermatology to see about this chest wall lesion.  I will also have to see about interventional radiology try to get his Port-A-Cath to work.  Volanda Napoleon, MD 7/30/20188:44 AM

## 2016-09-22 ENCOUNTER — Other Ambulatory Visit: Payer: Self-pay | Admitting: Physician Assistant

## 2016-09-22 ENCOUNTER — Other Ambulatory Visit (HOSPITAL_COMMUNITY): Payer: Self-pay | Admitting: Diagnostic Radiology

## 2016-09-22 ENCOUNTER — Other Ambulatory Visit: Payer: Self-pay | Admitting: Hematology & Oncology

## 2016-09-22 ENCOUNTER — Ambulatory Visit (HOSPITAL_COMMUNITY)
Admission: RE | Admit: 2016-09-22 | Discharge: 2016-09-22 | Disposition: A | Discharge status: Home / Self Care | Payer: Medicare Other | Source: Ambulatory Visit | Attending: Hematology & Oncology | Admitting: Hematology & Oncology

## 2016-09-22 ENCOUNTER — Emergency Department (HOSPITAL_COMMUNITY)
Admission: EM | Admit: 2016-09-22 | Discharge: 2016-09-22 | Discharge status: Absent without leave | Payer: Medicare Other | Source: Home / Self Care

## 2016-09-22 ENCOUNTER — Encounter (HOSPITAL_COMMUNITY): Payer: Self-pay | Admitting: Diagnostic Radiology

## 2016-09-22 DIAGNOSIS — C9000 Multiple myeloma not having achieved remission: Secondary | ICD-10-CM

## 2016-09-22 DIAGNOSIS — L989 Disorder of the skin and subcutaneous tissue, unspecified: Secondary | ICD-10-CM

## 2016-09-22 DIAGNOSIS — Y831 Surgical operation with implant of artificial internal device as the cause of abnormal reaction of the patient, or of later complication, without mention of misadventure at the time of the procedure: Secondary | ICD-10-CM | POA: Insufficient documentation

## 2016-09-22 DIAGNOSIS — T85618A Breakdown (mechanical) of other specified internal prosthetic devices, implants and grafts, initial encounter: Secondary | ICD-10-CM | POA: Insufficient documentation

## 2016-09-22 DIAGNOSIS — Z91041 Radiographic dye allergy status: Secondary | ICD-10-CM | POA: Diagnosis not present

## 2016-09-22 HISTORY — PX: IR CHEST FLUORO: IMG2383

## 2016-09-22 LAB — KAPPA/LAMBDA LIGHT CHAINS
IG LAMBDA FREE LIGHT CHAIN: 27.1 mg/L — AB (ref 5.7–26.3)
Ig Kappa Free Light Chain: 46.5 mg/L — ABNORMAL HIGH (ref 3.3–19.4)
KAPPA/LAMBDA FLC RATIO: 1.72 — AB (ref 0.26–1.65)

## 2016-09-22 LAB — TESTOSTERONE: Testosterone, Serum: 963 ng/dL — ABNORMAL HIGH (ref 264–916)

## 2016-09-22 LAB — IGG, IGA, IGM
IGA/IMMUNOGLOBULIN A, SERUM: 292 mg/dL (ref 61–437)
IgG, Qn, Serum: 1883 mg/dL — ABNORMAL HIGH (ref 700–1600)
IgM, Qn, Serum: 63 mg/dL (ref 20–172)

## 2016-09-22 NOTE — ED Notes (Signed)
Pt incorrectly arrived.  Pt had an appointment w/ Radiology which was determined after care was started.

## 2016-09-23 ENCOUNTER — Encounter (HOSPITAL_COMMUNITY): Payer: Self-pay

## 2016-09-23 ENCOUNTER — Ambulatory Visit (HOSPITAL_COMMUNITY)
Admission: RE | Admit: 2016-09-23 | Discharge: 2016-09-23 | Disposition: A | Discharge status: Home / Self Care | Payer: Medicare Other | Source: Ambulatory Visit | Attending: Diagnostic Radiology | Admitting: Diagnostic Radiology

## 2016-09-23 ENCOUNTER — Other Ambulatory Visit (HOSPITAL_COMMUNITY): Payer: Self-pay | Admitting: Diagnostic Radiology

## 2016-09-23 ENCOUNTER — Ambulatory Visit (HOSPITAL_COMMUNITY)
Admission: RE | Admit: 2016-09-23 | Discharge: 2016-09-23 | Disposition: A | Discharge status: Home / Self Care | Payer: Medicare Other | Source: Ambulatory Visit | Attending: Hematology & Oncology | Admitting: Hematology & Oncology

## 2016-09-23 DIAGNOSIS — Z79899 Other long term (current) drug therapy: Secondary | ICD-10-CM | POA: Insufficient documentation

## 2016-09-23 DIAGNOSIS — L989 Disorder of the skin and subcutaneous tissue, unspecified: Secondary | ICD-10-CM | POA: Diagnosis present

## 2016-09-23 DIAGNOSIS — T859XXA Unspecified complication of internal prosthetic device, implant and graft, initial encounter: Secondary | ICD-10-CM | POA: Diagnosis not present

## 2016-09-23 DIAGNOSIS — C9000 Multiple myeloma not having achieved remission: Secondary | ICD-10-CM

## 2016-09-23 DIAGNOSIS — Y838 Other surgical procedures as the cause of abnormal reaction of the patient, or of later complication, without mention of misadventure at the time of the procedure: Secondary | ICD-10-CM | POA: Insufficient documentation

## 2016-09-23 DIAGNOSIS — Y831 Surgical operation with implant of artificial internal device as the cause of abnormal reaction of the patient, or of later complication, without mention of misadventure at the time of the procedure: Secondary | ICD-10-CM | POA: Diagnosis not present

## 2016-09-23 DIAGNOSIS — T85618A Breakdown (mechanical) of other specified internal prosthetic devices, implants and grafts, initial encounter: Secondary | ICD-10-CM | POA: Diagnosis not present

## 2016-09-23 HISTORY — PX: IR REMOVAL TUN ACCESS W/ PORT W/O FL MOD SED: IMG2290

## 2016-09-23 HISTORY — PX: IR FLUORO GUIDE PORT INSERTION RIGHT: IMG5741

## 2016-09-23 HISTORY — PX: IR US GUIDE VASC ACCESS LEFT: IMG2389

## 2016-09-23 LAB — PROTEIN ELECTROPHORESIS, SERUM, WITH REFLEX
A/G Ratio: 0.9 (ref 0.7–1.7)
ALPHA 2: 1 g/dL (ref 0.4–1.0)
Albumin: 3.8 g/dL (ref 2.9–4.4)
Alpha 1: 0.2 g/dL (ref 0.0–0.4)
Beta: 1.1 g/dL (ref 0.7–1.3)
GLOBULIN, TOTAL: 4.4 g/dL — AB (ref 2.2–3.9)
Gamma Globulin: 2.1 g/dL — ABNORMAL HIGH (ref 0.4–1.8)
INTERPRETATION(SEE BELOW): 0
M-Spike, %: 1.4 g/dL — ABNORMAL HIGH
Total Protein: 8.2 g/dL (ref 6.0–8.5)

## 2016-09-23 LAB — CBC
HCT: 37.1 % — ABNORMAL LOW (ref 39.0–52.0)
Hemoglobin: 12.5 g/dL — ABNORMAL LOW (ref 13.0–17.0)
MCH: 24.9 pg — ABNORMAL LOW (ref 26.0–34.0)
MCHC: 33.7 g/dL (ref 30.0–36.0)
MCV: 73.9 fL — AB (ref 78.0–100.0)
PLATELETS: 247 10*3/uL (ref 150–400)
RBC: 5.02 MIL/uL (ref 4.22–5.81)
RDW: 17.1 % — ABNORMAL HIGH (ref 11.5–15.5)
WBC: 8 10*3/uL (ref 4.0–10.5)

## 2016-09-23 LAB — PROTIME-INR
INR: 1.01
PROTHROMBIN TIME: 13.3 s (ref 11.4–15.2)

## 2016-09-23 LAB — APTT: aPTT: 29 seconds (ref 24–36)

## 2016-09-23 LAB — GLUCOSE, CAPILLARY: GLUCOSE-CAPILLARY: 126 mg/dL — AB (ref 65–99)

## 2016-09-23 MED ORDER — LIDOCAINE-EPINEPHRINE (PF) 2 %-1:200000 IJ SOLN
INTRAMUSCULAR | Status: AC
  Filled 2016-09-23: qty 20

## 2016-09-23 MED ORDER — CEFAZOLIN SODIUM-DEXTROSE 2-4 GM/100ML-% IV SOLN
INTRAVENOUS | Status: AC
  Administered 2016-09-23: 2 g
  Filled 2016-09-23: qty 100

## 2016-09-23 MED ORDER — FENTANYL CITRATE (PF) 100 MCG/2ML IJ SOLN
INTRAMUSCULAR | Status: AC | PRN
  Administered 2016-09-23 (×2): 50 ug via INTRAVENOUS

## 2016-09-23 MED ORDER — FENTANYL CITRATE (PF) 100 MCG/2ML IJ SOLN
INTRAMUSCULAR | Status: AC
  Filled 2016-09-23: qty 4

## 2016-09-23 MED ORDER — LIDOCAINE HCL 1 % IJ SOLN
INTRAMUSCULAR | Status: AC
  Filled 2016-09-23: qty 20

## 2016-09-23 MED ORDER — SODIUM CHLORIDE 0.9 % IV SOLN
INTRAVENOUS | Status: DC
  Administered 2016-09-23: 10:00:00 via INTRAVENOUS

## 2016-09-23 MED ORDER — HEPARIN SOD (PORK) LOCK FLUSH 100 UNIT/ML IV SOLN
INTRAVENOUS | Status: AC
  Filled 2016-09-23: qty 5

## 2016-09-23 MED ORDER — MIDAZOLAM HCL 2 MG/2ML IJ SOLN
INTRAMUSCULAR | Status: AC | PRN
  Administered 2016-09-23 (×2): 1 mg via INTRAVENOUS

## 2016-09-23 MED ORDER — MIDAZOLAM HCL 2 MG/2ML IJ SOLN
INTRAMUSCULAR | Status: AC
  Filled 2016-09-23: qty 4

## 2016-09-23 MED ORDER — FENTANYL CITRATE (PF) 100 MCG/2ML IJ SOLN
50.0000 ug | Freq: Once | INTRAMUSCULAR | Status: AC
  Administered 2016-09-23: 50 ug via INTRAVENOUS
  Filled 2016-09-23: qty 2

## 2016-09-23 MED ORDER — HEPARIN SOD (PORK) LOCK FLUSH 100 UNIT/ML IV SOLN
INTRAVENOUS | Status: AC | PRN
  Administered 2016-09-23: 500 [IU] via INTRAVENOUS

## 2016-09-23 MED ORDER — LIDOCAINE-EPINEPHRINE (PF) 2 %-1:200000 IJ SOLN
INTRAMUSCULAR | Status: AC | PRN
  Administered 2016-09-23: 20 mL

## 2016-09-23 NOTE — H&P (Signed)
Chief Complaint: dysfunction of right chest PAC  Referring Physician:Dr. Burney Gauze  Supervising Physician: Arne Cleveland  Patient Status: [facility] - Out-pt  HPI: [name] is a 66 y.o. male who had his original PAC placed in his right chest in 2008 by Dr. Vernard Gambles for myeloma.  He has been doing well, but is still receiving treatment.  Recently he has been having a lot of pain around his PAC.  He presented yesterday for a evaluation, but due to his contrast allergy his port could not be injected.  It was felt that he likely had some dysfunction based off the evaluation and was brought back today for a replacement to the other side.  He admits to chronic pain and is asking for some pain medications right now.  Other than that he has no new complaints.    Past Medical History:  Past Medical History:  Diagnosis Date  . Anemia   . 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 Anemia, iron deficiency 03/12/2011  . Anxiety   . Anxiety disorder   . Arthralgia of multiple joints 12/31/2010  . Arthritis   . Constipation   . Diabetes mellitus without complication ([place])   . Diabetic neuropathy ([place])   . Diverticulosis   . Fibromyalgia   . GERD (gastroesophageal reflux disease)   . Headache   . Hemorrhoids   . Herpes zoster   . Hyperlipidemia   . Hypertension   . Hypotestosteronism 03/17/2012  . Multiple myeloma   . Multiple myeloma ([place])   . Myeloma ([place]) 12/31/2010  . Pneumonia   . Renal insufficiency   . Type 2 diabetes mellitus ([place])   . Urinary retention     Past Surgical History:  Past Surgical History:  Procedure Laterality Date  . CATARACT EXTRACTION     bi-lateral  . COLONOSCOPY    . COLONOSCOPY  07/13/2011   Procedure: COLONOSCOPY;  Surgeon: Ladene Artist, MD,FACG;  Location: [facility];  Service: Endoscopy;  Laterality: N/A;  . ESOPHAGOGASTRODUODENOSCOPY    . IR CHEST FLUORO  09/22/2016  . KNEE ARTHROSCOPY     left  . KNEE SURGERY    . PORTACATH PLACEMENT    .  POSTERIOR CERVICAL FUSION/FORAMINOTOMY N/A 06/05/2016   Procedure: LAMINECTOMY CERVICAL 3 - CERVICAL 6 WITH LATERAL MASS FUSION;  Surgeon: Consuella Lose, MD;  Location: [facility];  Service: Neurosurgery;  Laterality: N/A;  LAMINECTOMY CERVICAL 3 - CERVICAL 6 WITH LATERAL MASS FUSION  . TONSILLECTOMY    . TRANSURETHRAL RESECTION OF PROSTATE  01/2011   Dr. at [facility] in [place]    Family History:  Family History  Problem Relation Age of Onset  . Breast cancer Sister   . Breast cancer Mother   . Heart disease Mother   . Diabetes Sister   . Bone cancer Father   . Heart disease Sister   . Heart disease Daughter   . Throat cancer Brother   . Stomach cancer Brother   . Colon cancer Neg Hx     Social History:  reports that he has never smoked. He has never used smokeless tobacco. He reports that he does not drink alcohol or use drugs.  Allergies:  Allergies  Allergen Reactions  . Contrast Media [Iodinated Diagnostic Agents]     Patient states he was instructed not to take IV contrast.  In 2008 he had an unknown reaction, and was told not to take it again.  He was also told not to take  it due to his kidneys.  . Iodine Anxiety and Rash    Didn't feel right "instructed not to take per MD--something with his port"    Medications: Medications reviewed in epic  Please HPI for pertinent positives, otherwise complete 10 system ROS negative.  Mallampati Score: MD Evaluation Airway: WNL Heart: WNL Abdomen: WNL Chest/ Lungs: Other (comments) Chest/ lungs comments: right chest port in place ASA  Classification: 3 Mallampati/Airway Score: Two  Physical Exam: Temp (F)     98.2 97.5  97.5 (36.4)  08/01 0937  Pulse Rate     80 81  81  08/01 0937  Resp     '18 18  18  ' 08/01 0937  BP     151/74 154/80  154/80  08/01 0937  SpO2 (%)     96 100  100  08/01 0937   General: pleasant, WD, WN black male who is laying in bed in NAD HEENT: head is normocephalic, atraumatic.   Sclera are noninjected.  PERRL.  Ears and nose without any masses or lesions.  Mouth is pink and moist Heart: regular, rate, and rhythm.  Normal s1,s2. No obvious murmurs, gallops, or rubs noted.  Palpable radial pulses bilaterally Lungs: CTAB, no wheezes, rhonchi, or rales noted.  Respiratory effort nonlabored.  PAC in right upper chest Abd: soft, NT, ND, +BS, no masses, hernias, or organomegaly Psych: A&Ox3 with an appropriate affect.   Labs: Results for orders placed or performed during the hospital encounter of 09/23/16 (from the past 48 hour(s))  Glucose, capillary     Status: Abnormal   Collection Time: 09/23/16  9:53 AM  Result Value Ref Range   Glucose-Capillary 126 (H) 65 - 99 mg/dL    Imaging: Ir Chest Fluoro  Result Date: 09/22/2016 CLINICAL DATA:  66 year old with multiple myeloma. Patient has a right chest Port-A-Cath which was placed on 12/15/2006. Recently, patient is complaining of increased pain at port site and poor function of the Port-A-Cath. Patient has an iodine allergy. EXAM: CHEST FLUOROSCOPY COMPARISON:  12/14/2016 and chest radiograph 06/15/2016 FINDINGS: The skin surrounding the port appears normal. There is no obvious swelling or erythema. Fluoroscopic image demonstrates that the right jugular Port-A-Cath is in a stable position and appears to be well positioned in the lower SVC region. Right chest port was prepped and draped in a sterile fashion. Access needle was advanced into the port but the patient was complaining of discomfort. Immediately, blood filled the access needle tubing. It was very difficult to aspirate or flush the port, especially since patient was complaining of severe pain. Attempted to reposition the needle but the patient would not tolerate it due to severe pain. IMPRESSION: Port-A-Cath appears to be grossly intact based on fluoroscopy. However, there is concern for port discontinuity or dysfunction based on the immediate filling of blood within the  access needle. Unfortunately, the patient has an iodine allergy which may be severe based on my conversation with the patient. No contrast was injected. Based on the age of this port and the patient's symptoms, feel that removal of this port and placement of a new port, probably in the left chest, would be the best management. We will schedule the patient for port removal and new port placement in the near future. Electronically Signed   By: Markus Daft M.D.   On: 09/22/2016 10:00    Assessment/Plan 1. 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 Moises Blood note from yesterday.  His labs and vitals have been reviewed. Risks and benefits discussed with the patient including, but not limited to bleeding, infection, pneumothorax, or fibrin sheath development and need for additional procedures. All of the patient's questions were answered, patient is agreeable to proceed. Consent signed and in chart.  Thank you for this interesting consult.  I greatly enjoyed meeting Sharod Petsch and look forward to participating in their care.  A copy of this report was sent to the requesting provider on this date.  Electronically Signed: Henreitta Cea 09/23/2016, 11:33 AM   I spent a total of  30 Minutes   in face to face in clinical consultation, greater than 50% of which was counseling/coordinating care for dysfunction of PAC

## 2016-09-23 NOTE — Procedures (Signed)
   Procedure:      R IJ Port cathter replacement with [place] and fluoroscopy Preprocedure diagnosis:  Port malfunction Postprocedure diagnosis:  same EBL:     minimal Complications:   none immediate  See full dictation in [facility].  Dillard Cannon MD Main # [phone] Pager  [phone]

## 2016-09-23 NOTE — Discharge Instructions (Signed)
Moderate Conscious Sedation, Adult, Care After °These instructions provide you with information about caring for yourself after your procedure. Your health care provider may also give you more specific instructions. Your treatment has been planned according to current medical practices, but problems sometimes occur. Call your health care provider if you have any problems or questions after your procedure. °What can I expect after the procedure? °After your procedure, it is common: °· To feel sleepy for several hours. °· To feel clumsy and have poor balance for several hours. °· To have poor judgment for several hours. °· To vomit if you eat too soon. ° °Follow these instructions at home: °For at least 24 hours after the procedure: ° °· Do not: °? Participate in activities where you could fall or become injured. °? Drive. °? Use heavy machinery. °? Drink alcohol. °? Take sleeping pills or medicines that cause drowsiness. °? Make important decisions or sign legal documents. °? Take care of children on your own. °· Rest. °Eating and drinking °· Follow the diet recommended by your health care provider. °· If you vomit: °? Drink water, juice, or soup when you can drink without vomiting. °? Make sure you have little or no nausea before eating solid foods. °General instructions °· Have a responsible adult stay with you until you are awake and alert. °· Take over-the-counter and prescription medicines only as told by your health care provider. °· If you smoke, do not smoke without supervision. °· Keep all follow-up visits as told by your health care provider. This is important. °Contact a health care provider if: °· You keep feeling nauseous or you keep vomiting. °· You feel light-headed. °· You develop a rash. °· You have a fever. °Get help right away if: °· You have trouble breathing. °This information is not intended to replace advice given to you by your health care provider. Make sure you discuss any questions you have  with your health care provider. °Document Released: 11/30/2012 Document Revised: 07/15/2015 Document Reviewed: 06/01/2015 °Elsevier Interactive Patient Education © 2018 [employer] ° ° °Implanted Port Home Guide °An implanted port is a type of central line that is placed under the skin. Central lines are used to provide IV access when treatment or nutrition needs to be given through a person’s veins. Implanted ports are used for long-term IV access. An implanted port may be placed because: °· You need IV medicine that would be irritating to the small veins in your hands or arms. °· You need long-term IV medicines, such as antibiotics. °· You need IV nutrition for a long period. °· You need frequent blood draws for lab tests. °· You need dialysis. ° °Implanted ports are usually placed in the chest area, but they can also be placed in the upper arm, the abdomen, or the leg. An implanted port has two main parts: °· Reservoir. The reservoir is round and will appear as a small, raised area under your skin. The reservoir is the part where a needle is inserted to give medicines or draw blood. °· Catheter. The catheter is a thin, flexible tube that extends from the reservoir. The catheter is placed into a large vein. Medicine that is inserted into the reservoir goes into the catheter and then into the vein. ° °How will I care for my incision site? °Do not get the incision site wet. Bathe or shower as directed by your health care provider. °How is my port accessed? °Special steps must be taken to access the   port: °· Before the port is accessed, a numbing cream can be placed on the skin. This helps numb the skin over the port site. °· Your health care provider uses a sterile technique to access the port. °? Your health care provider must put on a mask and sterile gloves. °? The skin over your port is cleaned carefully with an antiseptic and allowed to dry. °? The port is gently pinched between sterile gloves, and a needle  is inserted into the port. °· Only "non-coring" port needles should be used to access the port. Once the port is accessed, a blood return should be checked. This helps ensure that the port is in the vein and is not clogged. °· If your port needs to remain accessed for a constant infusion, a clear (transparent) bandage will be placed over the needle site. The bandage and needle will need to be changed every week, or as directed by your health care provider. °· Keep the bandage covering the needle clean and dry. Do not get it wet. Follow your health care provider’s instructions on how to take a shower or bath while the port is accessed. °· If your port does not need to stay accessed, no bandage is needed over the port. ° °What is flushing? °Flushing helps keep the port from getting clogged. Follow your health care provider’s instructions on how and when to flush the port. Ports are usually flushed with saline solution or a medicine called heparin. The need for flushing will depend on how the port is used. °· If the port is used for intermittent medicines or blood draws, the port will need to be flushed: °? After medicines have been given. °? After blood has been drawn. °? As part of routine maintenance. °· If a constant infusion is running, the port may not need to be flushed. ° °How long will my port stay implanted? °The port can stay in for as long as your health care provider thinks it is needed. When it is time for the port to come out, surgery will be done to remove it. The procedure is similar to the one performed when the port was put in. °When should I seek immediate medical care? °When you have an implanted port, you should seek immediate medical care if: °· You notice a bad smell coming from the incision site. °· You have swelling, redness, or drainage at the incision site. °· You have more swelling or pain at the port site or the surrounding area. °· You have a fever that is not controlled with  medicine. ° °This information is not intended to replace advice given to you by your health care provider. Make sure you discuss any questions you have with your health care provider. °Document Released: 02/09/2005 Document Revised: 07/18/2015 Document Reviewed: 10/17/2012 °Elsevier Interactive Patient Education © 2017 [employer]. ° ° °Implanted Port Insertion, Care After °This sheet gives you information about how to care for yourself after your procedure. Your health care provider may also give you more specific instructions. If you have problems or questions, contact your health care provider. °What can I expect after the procedure? °After your procedure, it is common to have: °· Discomfort at the port insertion site. °· Bruising on the skin over the port. This should improve over 3-4 days. ° °Follow these instructions at home: °Port care °· After your port is placed, you will get a manufacturer's information card. The card has information about your port. Keep this card with   you at all times. °· Take care of the port as told by your health care provider. Ask your health care provider if you or a family member can get training for taking care of the port at home. A home health care nurse may also take care of the port. °· Make sure to remember what type of port you have. °Incision care °· Follow instructions from your health care provider about how to take care of your port insertion site. Make sure you: °? Wash your hands with soap and water before you change your bandage (dressing). If soap and water are not available, use hand sanitizer. °? Change your dressing as told by your health care provider. °? Leave stitches (sutures), skin glue, or adhesive strips in place. These skin closures may need to stay in place for 2 weeks or longer. If adhesive strip edges start to loosen and curl up, you may trim the loose edges. Do not remove adhesive strips completely unless your health care provider tells you to do  that. °· Check your port insertion site every day for signs of infection. Check for: °? More redness, swelling, or pain. °? More fluid or blood. °? Warmth. °? Pus or a bad smell. °General instructions °· Do not take baths, swim, or use a hot tub until your health care provider approves. °· Do not lift anything that is heavier than 10 lb (4.5 kg) for a week, or as told by your health care provider. °· Ask your health care provider when it is okay to: °? Return to work or school. °? Resume usual physical activities or sports. °· Do not drive for 24 hours if you were given a medicine to help you relax (sedative). °· Take over-the-counter and prescription medicines only as told by your health care provider. °· Wear a medical alert bracelet in case of an emergency. This will tell any health care providers that you have a port. °· Keep all follow-up visits as told by your health care provider. This is important. °Contact a health care provider if: °· You cannot flush your port with saline as directed, or you cannot draw blood from the port. °· You have a fever or chills. °· You have more redness, swelling, or pain around your port insertion site. °· You have more fluid or blood coming from your port insertion site. °· Your port insertion site feels warm to the touch. °· You have pus or a bad smell coming from the port insertion site. °Get help right away if: °· You have chest pain or shortness of breath. °· You have bleeding from your port that you cannot control. °Summary °· Take care of the port as told by your health care provider. °· Change your dressing as told by your health care provider. °· Keep all follow-up visits as told by your health care provider. °This information is not intended to replace advice given to you by your health care provider. Make sure you discuss any questions you have with your health care provider. °Document Released: 11/30/2012 Document Revised: 01/01/2016 Document Reviewed:  01/01/2016 °Elsevier Interactive Patient Education © 2017 [employer]. ° ° °

## 2016-09-29 ENCOUNTER — Other Ambulatory Visit: Payer: Self-pay | Admitting: Gastroenterology

## 2016-10-05 ENCOUNTER — Other Ambulatory Visit: Payer: Medicare Other

## 2016-10-05 ENCOUNTER — Other Ambulatory Visit: Payer: Self-pay | Admitting: *Deleted

## 2016-10-05 ENCOUNTER — Ambulatory Visit (HOSPITAL_BASED_OUTPATIENT_CLINIC_OR_DEPARTMENT_OTHER): Payer: Medicare Other

## 2016-10-05 VITALS — BP 138/68 | HR 70 | Temp 98.2°F | Resp 18

## 2016-10-05 DIAGNOSIS — E349 Endocrine disorder, unspecified: Secondary | ICD-10-CM

## 2016-10-05 MED ORDER — LIDOCAINE-PRILOCAINE 2.5-2.5 % EX CREA: 1.0000 "application " | TOPICAL_CREAM | CUTANEOUS | 3 refills | Status: DC | PRN

## 2016-10-05 MED ORDER — TESTOSTERONE CYPIONATE 200 MG/ML IM SOLN
INTRAMUSCULAR | Status: AC
  Filled 2016-10-05: qty 2

## 2016-10-05 MED ORDER — TESTOSTERONE CYPIONATE 200 MG/ML IM SOLN
300.0000 mg | INTRAMUSCULAR | Status: DC
Start: 2016-10-05 — End: 2016-10-05
  Administered 2016-10-05: 300 mg via INTRAMUSCULAR

## 2016-10-05 NOTE — Patient Instructions (Signed)
Testosterone injection What is this medicine? TESTOSTERONE (tes TOS ter one) is the main male hormone. It supports normal male development such as muscle growth, facial hair, and deep voice. It is used in males to treat low testosterone levels. This medicine may be used for other purposes; ask your health care provider or pharmacist if you have questions. COMMON BRAND NAME(S): Andro-L.A., Aveed, Delatestryl, Depo-Testosterone, Virilon What should I tell my health care provider before I take this medicine? They need to know if you have any of these conditions: -cancer -diabetes -heart disease -kidney disease -liver disease -lung disease -prostate disease -an unusual or allergic reaction to testosterone, other medicines, foods, dyes, or preservatives -pregnant or trying to get pregnant -breast-feeding How should I use this medicine? This medicine is for injection into a muscle. It is usually given by a health care professional in a hospital or clinic setting. Contact your pediatrician regarding the use of this medicine in children. While this medicine may be prescribed for children as young as 12 years of age for selected conditions, precautions do apply. Overdosage: If you think you have taken too much of this medicine contact a poison control center or emergency room at once. NOTE: This medicine is only for you. Do not share this medicine with others. What if I miss a dose? Try not to miss a dose. Your doctor or health care professional will tell you when your next injection is due. Notify the office if you are unable to keep an appointment. What may interact with this medicine? -medicines for diabetes -medicines that treat or prevent blood clots like warfarin -oxyphenbutazone -propranolol -steroid medicines like prednisone or cortisone This list may not describe all possible interactions. Give your health care provider a list of all the medicines, herbs, non-prescription drugs, or  dietary supplements you use. Also tell them if you smoke, drink alcohol, or use illegal drugs. Some items may interact with your medicine. What should I watch for while using this medicine? Visit your doctor or health care professional for regular checks on your progress. They will need to check the level of testosterone in your blood. This medicine is only approved for use in men who have low levels of testosterone related to certain medical conditions. Heart attacks and strokes have been reported with the use of this medicine. Notify your doctor or health care professional and seek emergency treatment if you develop breathing problems; changes in vision; confusion; chest pain or chest tightness; sudden arm pain; severe, sudden headache; trouble speaking or understanding; sudden numbness or weakness of the face, arm or leg; loss of balance or coordination. Talk to your doctor about the risks and benefits of this medicine. This medicine may affect blood sugar levels. If you have diabetes, check with your doctor or health care professional before you change your diet or the dose of your diabetic medicine. Testosterone injections are not commonly used in women. Women should inform their doctor if they wish to become pregnant or think they might be pregnant. There is a potential for serious side effects to an unborn child. Talk to your health care professional or pharmacist for more information. Talk with your doctor or health care professional about your birth control options while taking this medicine. This drug is banned from use in athletes by most athletic organizations. What side effects may I notice from receiving this medicine? Side effects that you should report to your doctor or health care professional as soon as possible: -allergic reactions like skin rash,   itching or hives, swelling of the face, lips, or tongue -breast enlargement -breathing problems -changes in emotions or moods -deep or  hoarse voice -irregular menstrual periods -signs and symptoms of liver injury like dark yellow or brown urine; general ill feeling or flu-like symptoms; light-colored stools; loss of appetite; nausea; right upper belly pain; unusually weak or tired; yellowing of the eyes or skin -stomach pain -swelling of the ankles, feet, hands -too frequent or persistent erections -trouble passing urine or change in the amount of urine Side effects that usually do not require medical attention (report to your doctor or health care professional if they continue or are bothersome): -acne -change in sex drive or performance -facial hair growth -hair loss -headache This list may not describe all possible side effects. Call your doctor for medical advice about side effects. You may report side effects to FDA at 1-800-FDA-1088. Where should I keep my medicine? Keep out of the reach of children. This medicine can be abused. Keep your medicine in a safe place to protect it from theft. Do not share this medicine with anyone. Selling or giving away this medicine is dangerous and against the law. Store at room temperature between 20 and 25 degrees C (68 and 77 degrees F). Do not freeze. Protect from light. Follow the directions for the product you are prescribed. Throw away any unused medicine after the expiration date. NOTE: This sheet is a summary. It may not cover all possible information. If you have questions about this medicine, talk to your doctor, pharmacist, or health care provider.  2018 Elsevier/Gold Standard (2015-03-16 07:33:55)  

## 2016-10-12 ENCOUNTER — Ambulatory Visit: Payer: Medicare Other

## 2016-10-12 ENCOUNTER — Other Ambulatory Visit (HOSPITAL_BASED_OUTPATIENT_CLINIC_OR_DEPARTMENT_OTHER): Payer: Medicare Other

## 2016-10-12 ENCOUNTER — Ambulatory Visit (HOSPITAL_BASED_OUTPATIENT_CLINIC_OR_DEPARTMENT_OTHER): Payer: Medicare Other | Admitting: Hematology & Oncology

## 2016-10-12 ENCOUNTER — Ambulatory Visit (HOSPITAL_BASED_OUTPATIENT_CLINIC_OR_DEPARTMENT_OTHER): Payer: Medicare Other

## 2016-10-12 VITALS — BP 160/78 | HR 81 | Temp 98.0°F | Resp 18 | Wt 203.0 lb

## 2016-10-12 DIAGNOSIS — E349 Endocrine disorder, unspecified: Secondary | ICD-10-CM

## 2016-10-12 DIAGNOSIS — C9000 Multiple myeloma not having achieved remission: Secondary | ICD-10-CM | POA: Diagnosis present

## 2016-10-12 DIAGNOSIS — L989 Disorder of the skin and subcutaneous tissue, unspecified: Secondary | ICD-10-CM

## 2016-10-12 DIAGNOSIS — G8929 Other chronic pain: Secondary | ICD-10-CM

## 2016-10-12 DIAGNOSIS — Z299 Encounter for prophylactic measures, unspecified: Secondary | ICD-10-CM

## 2016-10-12 LAB — CBC WITH DIFFERENTIAL (CANCER CENTER ONLY)
BASO#: 0.1 10*3/uL (ref 0.0–0.2)
BASO%: 0.7 % (ref 0.0–2.0)
EOS%: 3.7 % (ref 0.0–7.0)
Eosinophils Absolute: 0.3 10*3/uL (ref 0.0–0.5)
HEMATOCRIT: 37.4 % — AB (ref 38.7–49.9)
HGB: 12.5 g/dL — ABNORMAL LOW (ref 13.0–17.1)
LYMPH#: 1.6 10*3/uL (ref 0.9–3.3)
LYMPH%: 22.1 % (ref 14.0–48.0)
MCH: 25 pg — ABNORMAL LOW (ref 28.0–33.4)
MCHC: 33.4 g/dL (ref 32.0–35.9)
MCV: 75 fL — ABNORMAL LOW (ref 82–98)
MONO#: 0.7 10*3/uL (ref 0.1–0.9)
MONO%: 9.7 % (ref 0.0–13.0)
NEUT#: 4.7 10*3/uL (ref 1.5–6.5)
NEUT%: 63.8 % (ref 40.0–80.0)
Platelets: 242 10*3/uL (ref 145–400)
RBC: 5.01 10*6/uL (ref 4.20–5.70)
RDW: 16.6 % — AB (ref 11.1–15.7)
WBC: 7.3 10*3/uL (ref 4.0–10.0)

## 2016-10-12 LAB — CMP (CANCER CENTER ONLY)
ALK PHOS: 83 U/L (ref 26–84)
ALT: 14 U/L (ref 10–47)
AST: 22 U/L (ref 11–38)
Albumin: 3.7 g/dL (ref 3.3–5.5)
BILIRUBIN TOTAL: 0.6 mg/dL (ref 0.20–1.60)
BUN: 14 mg/dL (ref 7–22)
CALCIUM: 8.8 mg/dL (ref 8.0–10.3)
CO2: 28 mEq/L (ref 18–33)
CREATININE: 2.3 mg/dL — AB (ref 0.6–1.2)
Chloride: 106 mEq/L (ref 98–108)
GLUCOSE: 135 mg/dL — AB (ref 73–118)
Potassium: 4.2 mEq/L (ref 3.3–4.7)
Sodium: 142 mEq/L (ref 128–145)
Total Protein: 8.9 g/dL — ABNORMAL HIGH (ref 6.4–8.1)

## 2016-10-12 MED ORDER — SODIUM CHLORIDE 0.9 % IV SOLN
Freq: Once | INTRAVENOUS | Status: AC
  Administered 2016-10-12: 09:00:00 via INTRAVENOUS

## 2016-10-12 MED ORDER — MORPHINE SULFATE 4 MG/ML IJ SOLN
4.0000 mg | Freq: Once | INTRAMUSCULAR | Status: AC
  Administered 2016-10-12: 4 mg via INTRAVENOUS
  Filled 2016-10-12: qty 1

## 2016-10-12 MED ORDER — SODIUM CHLORIDE 0.9 % IV SOLN
60.0000 mg | Freq: Once | INTRAVENOUS | Status: AC
  Administered 2016-10-12: 60 mg via INTRAVENOUS
  Filled 2016-10-12: qty 20

## 2016-10-12 MED ORDER — SODIUM CHLORIDE 0.9% FLUSH
10.0000 mL | INTRAVENOUS | Status: DC | PRN
  Administered 2016-10-12: 10 mL via INTRAVENOUS
  Filled 2016-10-12: qty 10

## 2016-10-12 MED ORDER — HEPARIN SOD (PORK) LOCK FLUSH 100 UNIT/ML IV SOLN
500.0000 [IU] | Freq: Once | INTRAVENOUS | Status: AC
  Administered 2016-10-12: 500 [IU] via INTRAVENOUS
  Filled 2016-10-12: qty 5

## 2016-10-12 MED ORDER — MORPHINE SULFATE (PF) 4 MG/ML IV SOLN
INTRAVENOUS | Status: AC
  Filled 2016-10-12: qty 1

## 2016-10-12 NOTE — Progress Notes (Signed)
Hematology and Oncology Follow Up Visit  [name] [id number] [birth year] 66 y.o. 10/12/2016   Principle Diagnosis:  1. IgG kappa myeloma. 2. Anemia secondary to renal insufficiency. 3. Intermittent iron-deficiency anemia. 4. Hypotestosteronemia  Current Therapy:   1. Aredia 60 mg IV q.2 months  2. Aranesp 300 mcg subcu as needed for hemoglobin less than 11. 3. Depo-Testosterone 300 mg q.4 weeks. 4. IV iron as indicated.     Interim History:  [name] is back for followup.  He is not having a good day. He is having a lot of bony pain. I don't know if this is his fibromyalgia. I don't know if this might be his diabetes.   1 issues might be myeloma. While we last saw him, his M spike was 1.4 g/dL. His IgG level was 1883 milligrams per deciliter. His Kappa Light chain was 4.7 mg/dL.   He's had no fever. His been no issues with bowels or bladder.  His pain. He's had a lot of neck pain.   His last testosterone level was 963 ng/dL.  as such, I think we can spread his injections out to monthly.  He's had no rashes. He's had occasional headache. His neck is bothering him quite a bit. He sees the spine surgeon in a few days.  Overall, his performance status is ECOG 1.   Medications:  Current Outpatient Prescriptions:  .  amLODipine (NORVASC) 10 MG tablet, Take 10 mg by mouth daily., Disp: , Rfl:  .  CARAFATE 1 GM/10ML suspension, Take 1 g by mouth 4 (four) times daily. , Disp: , Rfl:  .  dicyclomine (BENTYL) 10 MG capsule, Take 1 capsule (10 mg total) by mouth 3 (three) times daily. (Patient not taking: Reported on 05/22/2016), Disp: 90 capsule, Rfl: 2 .  furosemide (LASIX) 40 MG tablet, Take 40 mg by mouth., Disp: , Rfl:  .  glipiZIDE (GLUCOTROL) 5 MG tablet, Take 5 mg by mouth 2 (two) times daily before a meal. , Disp: , Rfl:  .  isosorbide mononitrate (IMDUR) 30 MG 24 hr tablet, Take 30 mg by mouth daily., Disp: , Rfl:  .  latanoprost (XALATAN) 0.005 % ophthalmic solution,  Place 1 drop into the right eye every evening. , Disp: , Rfl:  .  lidocaine-prilocaine (EMLA) cream, Apply 1 application topically as needed., Disp: 30 g, Rfl: 3 .  Linaclotide (LINZESS) 145 MCG CAPS capsule, Take 1 capsule (145 mcg total) by mouth daily before breakfast., Disp: 30 capsule, Rfl: 2 .  losartan (COZAAR) 25 MG tablet, Take 25 mg by mouth., Disp: , Rfl:  .  meclizine (ANTIVERT) 50 MG tablet, Take 0.5 tablets (25 mg total) by mouth 4 (four) times daily as needed for dizziness., Disp: 30 tablet, Rfl: 0 .  methocarbamol (ROBAXIN) 500 MG tablet, Take 500 mg by mouth 4 (four) times daily., Disp: , Rfl:  .  metoprolol succinate (TOPROL XL) 50 MG 24 hr tablet, Take 50 mg by mouth daily. , Disp: , Rfl:  .  mirabegron ER (MYRBETRIQ) 50 MG TB24 tablet, Take 50 mg by mouth daily., Disp: , Rfl:  .  Morphine-Naltrexone (EMBEDA) 20-0.8 MG CPCR, Take 1 capsule by mouth 3 (three) times daily., Disp: , Rfl:  .  omeprazole (PRILOSEC) 40 MG capsule, take 1 capsule by mouth twice a day, Disp: 180 capsule, Rfl: 0 .  ondansetron (ZOFRAN ODT) 4 MG disintegrating tablet, Take 1 tablet (4 mg total) by mouth every 8 (eight) hours as needed for nausea or  vomiting., Disp: 10 tablet, Rfl: 0 .  orphenadrine (NORFLEX) 100 MG tablet, Take 1 tablet (100 mg total) by mouth 2 (two) times daily. (Patient not taking: Reported on 05/28/2016), Disp: 30 tablet, Rfl: 0 .  oxyCODONE (OXYCONTIN) 20 mg 12 hr tablet, Take 1 tablet (20 mg total) by mouth every 12 (twelve) hours., Disp: 14 tablet, Rfl: 0 .  oxyCODONE-acetaminophen (PERCOCET) 10-325 MG tablet, Take 1 tablet by mouth every 6 (six) hours as needed for pain. (Patient not taking: Reported on 06/16/2016), Disp: 30 tablet, Rfl: 0 .  oxyCODONE-acetaminophen (PERCOCET) 10-325 MG tablet, Take 1 tablet by mouth every 6 (six) hours as needed for pain., Disp: 60 tablet, Rfl: 0 .  Polyvinyl Alcohol-Povidone (CLEAR EYES ALL SEASONS) 5-6 MG/ML SOLN, Place 2 drops into both eyes 3  (three) times daily as needed (drye eyes.). , Disp: , Rfl:  .  potassium chloride SA (K-DUR,KLOR-CON) 20 MEQ tablet, Take 1 tablet (20 mEq total) by mouth 2 (two) times daily. (Patient not taking: Reported on 05/22/2016), Disp: 60 tablet, Rfl: 6 .  prochlorperazine (COMPAZINE) 10 MG tablet, Take 1 tablet (10 mg total) by mouth every 6 (six) hours as needed for nausea or vomiting. (Patient not taking: Reported on 05/22/2016), Disp: 60 tablet, Rfl: 2 .  Tafluprost (ZIOPTAN) 0.0015 % SOLN, Place 1 drop into both eyes daily. , Disp: , Rfl:  .  Vitamin D, Ergocalciferol, (DRISDOL) 50000 UNITS CAPS capsule, Take 50,000 Units by mouth every Saturday. , Disp: , Rfl:  No current facility-administered medications for this visit.   Facility-Administered Medications Ordered in Other Visits:  .  morphine 4 MG/ML injection 4 mg, 4 mg, Intravenous, Q4H PRN, Volanda Napoleon, MD  Allergies:  Allergies  Allergen Reactions  . Contrast Media [Iodinated Diagnostic Agents]     Patient states he was instructed not to take IV contrast.  In 2008 he had an unknown reaction, and was told not to take it again.  He was also told not to take it due to his kidneys.  . Iodine Anxiety and Rash    Didn't feel right "instructed not to take per MD--something with his port"    Past Medical History, Surgical history, Social history, and Family History were reviewed and updated.  Review of Systems: As above  Physical Exam:  weight is 203 lb (92.1 kg). His oral temperature is 98 F (36.7 C). His blood pressure is 160/78 (abnormal) and his pulse is 81. His respiration is 18.   His head and neck exam shows no ocular or oral lesions. He has no adenopathy in the neck. His thyroid is non-palpable. Lungs are clear. Cardiac exam regular rate and rhythm with no murmurs, rubs or bruits.. Abdomen is soft. His abdomen is slightly distended. He has good bowel sounds. There is no fluid wave. There is no palpable liver or spleen tip. Back  exam shows no tenderness in the lumbar spine. There is no paravertebral muscle spasms. . He has slightly decreased range of motion of the lower back. Extremities shows no clubbing cyanosis or edema.He does have a swelling in the posterior right shoulder. There is some tenderness in this area to palpation. He has fairly decent range of motion of the right arm. He has decent strength in his legs. Skin exam shows no rashes. Neurological exam is nonfocal.  Lab Results  Component Value Date   WBC 7.3 10/12/2016   HGB 12.5 (L) 10/12/2016   HCT 37.4 (L) 10/12/2016   MCV 75 (L) 10/12/2016  PLT 242 10/12/2016     Chemistry      Component Value Date/Time   NA 142 10/12/2016 0801   K 4.2 10/12/2016 0801   CL 106 10/12/2016 0801   CO2 28 10/12/2016 0801   BUN 14 10/12/2016 0801   CREATININE 2.3 (H) 10/12/2016 0801      Component Value Date/Time   CALCIUM 8.8 10/12/2016 0801   ALKPHOS 83 10/12/2016 0801   AST 22 10/12/2016 0801   ALT 14 10/12/2016 0801   BILITOT 0.60 10/12/2016 0801         Impression and Plan: Mr. Cosma is a 66 year old gentleman. He has multiple medical problems.   We clearly we'll have to see what his myeloma numbers are this visit. If they are higher, then we may have to consider intervening with therapy for myeloma. He may need to have a bone marrow test done.  We will go ahead and do his Aredia today.  His testosterone level has been on the high side. I think we can probably do his injections monthly.  I would like to see him back in another month.  We will see him back sooner if necessary depending on his myeloma studies.  Volanda Napoleon, MD 8/20/20189:04 AM

## 2016-10-12 NOTE — Progress Notes (Signed)
1045-Pt complaining of aching pain "all over" at a 10/10.  Dr. [name] notified and order received for pt to receive 4 mg of IV Morphine now.

## 2016-10-12 NOTE — Patient Instructions (Signed)
Pamidronate injection What is this medicine? PAMIDRONATE (pa mi DROE nate) slows calcium loss from bones. It is used to treat high calcium blood levels from cancer or Paget's disease. It is also used to treat bone pain and prevent fractures from certain cancers that have spread to the bone. This medicine may be used for other purposes; ask your health care provider or pharmacist if you have questions. COMMON BRAND NAME(S): Aredia What should I tell my health care provider before I take this medicine? They need to know if you have any of these conditions: -aspirin-sensitive asthma -dental disease -kidney disease -an unusual or allergic reaction to pamidronate, other medicines, foods, dyes, or preservatives -pregnant or trying to get pregnant -breast-feeding How should I use this medicine? This medicine is for infusion into a vein. It is given by a health care professional in a hospital or clinic setting. Talk to your pediatrician regarding the use of this medicine in children. This medicine is not approved for use in children. Overdosage: If you think you have taken too much of this medicine contact a poison control center or emergency room at once. NOTE: This medicine is only for you. Do not share this medicine with others. What if I miss a dose? This does not apply. What may interact with this medicine? -certain antibiotics given by injection -medicines for inflammation or pain like ibuprofen, naproxen -some diuretics like bumetanide, furosemide -cyclosporine -parathyroid hormone -tacrolimus -teriparatide -thalidomide This list may not describe all possible interactions. Give your health care provider a list of all the medicines, herbs, non-prescription drugs, or dietary supplements you use. Also tell them if you smoke, drink alcohol, or use illegal drugs. Some items may interact with your medicine. What should I watch for while using this medicine? Visit your doctor or health care  professional for regular checkups. It may be some time before you see the benefit from this medicine. Do not stop taking your medicine unless your doctor tells you to. Your doctor may order blood tests or other tests to see how you are doing. Women should inform their doctor if they wish to become pregnant or think they might be pregnant. There is a potential for serious side effects to an unborn child. Talk to your health care professional or pharmacist for more information. You should make sure that you get enough calcium and vitamin D while you are taking this medicine. Discuss the foods you eat and the vitamins you take with your health care professional. Some people who take this medicine have severe bone, joint, and/or muscle pain. This medicine may also increase your risk for a broken thigh bone. Tell your doctor right away if you have pain in your upper leg or groin. Tell your doctor if you have any pain that does not go away or that gets worse. What side effects may I notice from receiving this medicine? Side effects that you should report to your doctor or health care professional as soon as possible: -allergic reactions like skin rash, itching or hives, swelling of the face, lips, or tongue -black or tarry stools -changes in vision -eye inflammation, pain -high blood pressure -jaw pain, especially burning or cramping -muscle weakness -numb, tingling pain -swelling of feet or hands -trouble passing urine or change in the amount of urine -unable to move easily Side effects that usually do not require medical attention (report to your doctor or health care professional if they continue or are bothersome): -bone, joint, or muscle pain -constipation -dizzy, drowsy -  fever -headache -loss of appetite -nausea, vomiting -pain at site where injected This list may not describe all possible side effects. Call your doctor for medical advice about side effects. You may report side effects to  FDA at 1-800-FDA-1088. Where should I keep my medicine? This drug is given in a hospital or clinic and will not be stored at home. NOTE: This sheet is a summary. It may not cover all possible information. If you have questions about this medicine, talk to your doctor, pharmacist, or health care provider.  2018 Elsevier/Gold Standard (2010-08-08 08:49:49)  

## 2016-10-12 NOTE — Patient Instructions (Signed)
Implanted Port Insertion, Care After °This sheet gives you information about how to care for yourself after your procedure. Your health care provider may also give you more specific instructions. If you have problems or questions, contact your health care provider. °What can I expect after the procedure? °After your procedure, it is common to have: °· Discomfort at the port insertion site. °· Bruising on the skin over the port. This should improve over 3-4 days. ° °Follow these instructions at home: °Port care °· After your port is placed, you will get a manufacturer's information card. The card has information about your port. Keep this card with you at all times. °· Take care of the port as told by your health care provider. Ask your health care provider if you or a family member can get training for taking care of the port at home. A home health care nurse may also take care of the port. °· Make sure to remember what type of port you have. °Incision care °· Follow instructions from your health care provider about how to take care of your port insertion site. Make sure you: °? Wash your hands with soap and water before you change your bandage (dressing). If soap and water are not available, use hand sanitizer. °? Change your dressing as told by your health care provider. °? Leave stitches (sutures), skin glue, or adhesive strips in place. These skin closures may need to stay in place for 2 weeks or longer. If adhesive strip edges start to loosen and curl up, you may trim the loose edges. Do not remove adhesive strips completely unless your health care provider tells you to do that. °· Check your port insertion site every day for signs of infection. Check for: °? More redness, swelling, or pain. °? More fluid or blood. °? Warmth. °? Pus or a bad smell. °General instructions °· Do not take baths, swim, or use a hot tub until your health care provider approves. °· Do not lift anything that is heavier than 10 lb (4.5  kg) for a week, or as told by your health care provider. °· Ask your health care provider when it is okay to: °? Return to work or school. °? Resume usual physical activities or sports. °· Do not drive for 24 hours if you were given a medicine to help you relax (sedative). °· Take over-the-counter and prescription medicines only as told by your health care provider. °· Wear a medical alert bracelet in case of an emergency. This will tell any health care providers that you have a port. °· Keep all follow-up visits as told by your health care provider. This is important. °Contact a health care provider if: °· You cannot flush your port with saline as directed, or you cannot draw blood from the port. °· You have a fever or chills. °· You have more redness, swelling, or pain around your port insertion site. °· You have more fluid or blood coming from your port insertion site. °· Your port insertion site feels warm to the touch. °· You have pus or a bad smell coming from the port insertion site. °Get help right away if: °· You have chest pain or shortness of breath. °· You have bleeding from your port that you cannot control. °Summary °· Take care of the port as told by your health care provider. °· Change your dressing as told by your health care provider. °· Keep all follow-up visits as told by your health care provider. °  This information is not intended to replace advice given to you by your health care provider. Make sure you discuss any questions you have with your health care provider. °Document Released: 11/30/2012 Document Revised: 01/01/2016 Document Reviewed: 01/01/2016 °Elsevier Interactive Patient Education © 2017 [employer] ° °

## 2016-10-13 LAB — KAPPA/LAMBDA LIGHT CHAINS
Ig Kappa Free Light Chain: 37 mg/L — ABNORMAL HIGH (ref 3.3–19.4)
Ig Lambda Free Light Chain: 22.6 mg/L (ref 5.7–26.3)
Kappa/Lambda FluidC Ratio: 1.64 (ref 0.26–1.65)

## 2016-10-13 LAB — IGG, IGA, IGM
IGA/IMMUNOGLOBULIN A, SERUM: 300 mg/dL (ref 61–437)
IGM (IMMUNOGLOBIN M), SRM: 60 mg/dL (ref 20–172)
IgG, Qn, Serum: 2067 mg/dL — ABNORMAL HIGH (ref 700–1600)

## 2016-10-13 LAB — TESTOSTERONE: Testosterone, Serum: >1500 ng/dL — ABNORMAL HIGH (ref 264–916)

## 2016-10-14 LAB — PROTEIN ELECTROPHORESIS, SERUM, WITH REFLEX
A/G RATIO SPE: 0.9 (ref 0.7–1.7)
ALBUMIN: 3.8 g/dL (ref 2.9–4.4)
Alpha 1: 0.2 g/dL (ref 0.0–0.4)
Alpha 2: 1 g/dL (ref 0.4–1.0)
Beta: 1 g/dL (ref 0.7–1.3)
GAMMA GLOBULIN: 2 g/dL — AB (ref 0.4–1.8)
GLOBULIN, TOTAL: 4.2 g/dL — AB (ref 2.2–3.9)
Interpretation(See Below): 0
M-SPIKE, %: 1.2 g/dL — AB
TOTAL PROTEIN: 8 g/dL (ref 6.0–8.5)

## 2016-10-19 ENCOUNTER — Other Ambulatory Visit: Payer: Medicare Other

## 2016-10-19 ENCOUNTER — Ambulatory Visit: Payer: Medicare Other | Admitting: Hematology & Oncology

## 2016-10-19 ENCOUNTER — Ambulatory Visit (HOSPITAL_BASED_OUTPATIENT_CLINIC_OR_DEPARTMENT_OTHER): Payer: Medicare Other | Admitting: Hematology & Oncology

## 2016-10-19 ENCOUNTER — Ambulatory Visit: Payer: Medicare Other

## 2016-10-19 VITALS — BP 151/73 | HR 72 | Temp 98.3°F | Resp 20

## 2016-10-19 DIAGNOSIS — M898X9 Other specified disorders of bone, unspecified site: Secondary | ICD-10-CM | POA: Diagnosis not present

## 2016-10-19 DIAGNOSIS — C9000 Multiple myeloma not having achieved remission: Secondary | ICD-10-CM

## 2016-10-19 MED ORDER — TESTOSTERONE CYPIONATE 200 MG/ML IM SOLN: 300.0000 mg | INTRAMUSCULAR | Status: DC

## 2016-10-19 NOTE — Progress Notes (Signed)
Hematology and Oncology Follow Up Visit  [name] [id number] April 09, 1950 66 y.o. 10/19/2016   Principle Diagnosis:  1. IgG kappa myeloma. 2. Anemia secondary to renal insufficiency. 3. Intermittent iron-deficiency anemia. 4. Hypotestosteronemia  Current Therapy:   1. Aredia 60 mg IV q.2 months  2. Aranesp 300 mcg subcu as needed for hemoglobin less than 11. 3. Depo-Testosterone 300 mg q.4 weeks. 4. IV iron as indicated.     Interim History:  [name] is back for followup.  He still having quite a few problems with bony pain. I don't know this is from his fibromyalgia, his recent cervical spine surgery, or any evidence of myelomatous progression.  We did do his myeloma studies with his last visit. They are somewhat inconclusive. His M spike was 1.2 g/dL. Prior M spike level was 1.4. His IgG level was 2067 mg/dL. His Kappa Lightchain was 3.8 mg/dL.  He is having more pain. This is his biggest complaint. He says his blood sugars are doing okay. He is not really worried about his blood sugars.  There's been no nausea or vomiting. He's had no fever. He's had no cough.  There is no bleeding.  He takes quite a few medications.  His last testosterone level was 1500 ng. As such, we have cut back his injection frequency for testosterone to every 4 weeks.  He is not sleeping all that well because of his discomfort.  Overall, his performance status is ECOG 2.  Medications:  Current Outpatient Prescriptions:  .  amLODipine (NORVASC) 10 MG tablet, Take 10 mg by mouth daily., Disp: , Rfl:  .  CARAFATE 1 GM/10ML suspension, Take 1 g by mouth 4 (four) times daily. , Disp: , Rfl:  .  dicyclomine (BENTYL) 10 MG capsule, Take 1 capsule (10 mg total) by mouth 3 (three) times daily. (Patient not taking: Reported on 05/22/2016), Disp: 90 capsule, Rfl: 2 .  furosemide (LASIX) 40 MG tablet, Take 40 mg by mouth., Disp: , Rfl:  .  glipiZIDE (GLUCOTROL) 5 MG tablet, Take 5 mg by mouth 2 (two)  times daily before a meal. , Disp: , Rfl:  .  isosorbide mononitrate (IMDUR) 30 MG 24 hr tablet, Take 30 mg by mouth daily., Disp: , Rfl:  .  latanoprost (XALATAN) 0.005 % ophthalmic solution, Place 1 drop into the right eye every evening. , Disp: , Rfl:  .  lidocaine-prilocaine (EMLA) cream, Apply 1 application topically as needed., Disp: 30 g, Rfl: 3 .  Linaclotide (LINZESS) 145 MCG CAPS capsule, Take 1 capsule (145 mcg total) by mouth daily before breakfast., Disp: 30 capsule, Rfl: 2 .  losartan (COZAAR) 25 MG tablet, Take 25 mg by mouth., Disp: , Rfl:  .  meclizine (ANTIVERT) 50 MG tablet, Take 0.5 tablets (25 mg total) by mouth 4 (four) times daily as needed for dizziness., Disp: 30 tablet, Rfl: 0 .  methocarbamol (ROBAXIN) 500 MG tablet, Take 500 mg by mouth 4 (four) times daily., Disp: , Rfl:  .  metoprolol succinate (TOPROL XL) 50 MG 24 hr tablet, Take 50 mg by mouth daily. , Disp: , Rfl:  .  mirabegron ER (MYRBETRIQ) 50 MG TB24 tablet, Take 50 mg by mouth daily., Disp: , Rfl:  .  Morphine-Naltrexone (EMBEDA) 20-0.8 MG CPCR, Take 1 capsule by mouth 3 (three) times daily., Disp: , Rfl:  .  omeprazole (PRILOSEC) 40 MG capsule, take 1 capsule by mouth twice a day, Disp: 180 capsule, Rfl: 0 .  ondansetron (ZOFRAN ODT) 4  MG disintegrating tablet, Take 1 tablet (4 mg total) by mouth every 8 (eight) hours as needed for nausea or vomiting., Disp: 10 tablet, Rfl: 0 .  orphenadrine (NORFLEX) 100 MG tablet, Take 1 tablet (100 mg total) by mouth 2 (two) times daily. (Patient not taking: Reported on 05/28/2016), Disp: 30 tablet, Rfl: 0 .  oxyCODONE (OXYCONTIN) 20 mg 12 hr tablet, Take 1 tablet (20 mg total) by mouth every 12 (twelve) hours., Disp: 14 tablet, Rfl: 0 .  oxyCODONE-acetaminophen (PERCOCET) 10-325 MG tablet, Take 1 tablet by mouth every 6 (six) hours as needed for pain. (Patient not taking: Reported on 06/16/2016), Disp: 30 tablet, Rfl: 0 .  oxyCODONE-acetaminophen (PERCOCET) 10-325 MG tablet,  Take 1 tablet by mouth every 6 (six) hours as needed for pain., Disp: 60 tablet, Rfl: 0 .  Polyvinyl Alcohol-Povidone (CLEAR EYES ALL SEASONS) 5-6 MG/ML SOLN, Place 2 drops into both eyes 3 (three) times daily as needed (drye eyes.). , Disp: , Rfl:  .  potassium chloride SA (K-DUR,KLOR-CON) 20 MEQ tablet, Take 1 tablet (20 mEq total) by mouth 2 (two) times daily. (Patient not taking: Reported on 05/22/2016), Disp: 60 tablet, Rfl: 6 .  prochlorperazine (COMPAZINE) 10 MG tablet, Take 1 tablet (10 mg total) by mouth every 6 (six) hours as needed for nausea or vomiting. (Patient not taking: Reported on 05/22/2016), Disp: 60 tablet, Rfl: 2 .  Tafluprost (ZIOPTAN) 0.0015 % SOLN, Place 1 drop into both eyes daily. , Disp: , Rfl:  .  Vitamin D, Ergocalciferol, (DRISDOL) 50000 UNITS CAPS capsule, Take 50,000 Units by mouth every Saturday. , Disp: , Rfl:  No current facility-administered medications for this visit.   Facility-Administered Medications Ordered in Other Visits:  .  morphine 4 MG/ML injection 4 mg, 4 mg, Intravenous, Q4H PRN, Volanda Napoleon, MD  Allergies:  Allergies  Allergen Reactions  . Contrast Media [Iodinated Diagnostic Agents]     Patient states he was instructed not to take IV contrast.  In 2008 he had an unknown reaction, and was told not to take it again.  He was also told not to take it due to his kidneys.  . Iodine Anxiety and Rash    Didn't feel right "instructed not to take per MD--something with his port"    Past Medical History, Surgical history, Social history, and Family History were reviewed and updated.  Review of Systems: As stated in the interim history  Physical Exam:  oral temperature is 98.3 F (36.8 C). His blood pressure is 151/73 (abnormal) and his pulse is 72. His respiration is 20 and oxygen saturation is 100%.   Physical Exam  Constitutional: He is oriented to person, place, and time.  HENT:  Head: Normocephalic and atraumatic.  Mouth/Throat:  Oropharynx is clear and moist.  Eyes: Pupils are equal, round, and reactive to light. EOM are normal.  Neck: Normal range of motion.  Cardiovascular: Normal rate, regular rhythm and normal heart sounds.   Pulmonary/Chest: Effort normal and breath sounds normal.  Abdominal: Soft. Bowel sounds are normal.  Musculoskeletal: Normal range of motion. He exhibits no edema, tenderness or deformity.  Lymphadenopathy:    He has no cervical adenopathy.  Neurological: He is alert and oriented to person, place, and time.  Skin: Skin is warm and dry. No rash noted. No erythema.  Psychiatric: He has a normal mood and affect. His behavior is normal. Judgment and thought content normal.  Vitals reviewed.    Lab Results  Component Value Date  WBC 7.3 10/12/2016   HGB 12.5 (L) 10/12/2016   HCT 37.4 (L) 10/12/2016   MCV 75 (L) 10/12/2016   PLT 242 10/12/2016     Chemistry      Component Value Date/Time   NA 142 10/12/2016 0801   K 4.2 10/12/2016 0801   CL 106 10/12/2016 0801   CO2 28 10/12/2016 0801   BUN 14 10/12/2016 0801   CREATININE 2.3 (H) 10/12/2016 0801      Component Value Date/Time   CALCIUM 8.8 10/12/2016 0801   ALKPHOS 83 10/12/2016 0801   AST 22 10/12/2016 0801   ALT 14 10/12/2016 0801   BILITOT 0.60 10/12/2016 0801         Impression and Plan: Mr. Mcleary is a 66 year old gentleman. He has multiple medical problems.   The results of his myeloma panel are somewhat inconclusive. As such, we will have to get a bone marrow biopsy on him. I will have radiology do this for me.  We also need to have a PET scan done so we can see if there is myelomatous involvement of his bones causing his pain.  He understands all of this. I spent about 30 minutes with him. The majority of this time was spent reviewing his labs and going over my recommendations. I told him that we might have to start him on myeloma therapy depending on what the bone marrow test shows.  Volanda Napoleon,  MD 8/27/20184:15 PM

## 2016-10-20 ENCOUNTER — Ambulatory Visit: Payer: Medicare Other

## 2016-10-20 ENCOUNTER — Ambulatory Visit: Payer: Medicare Other | Admitting: Hematology & Oncology

## 2016-10-21 ENCOUNTER — Other Ambulatory Visit: Payer: Self-pay | Admitting: Radiology

## 2016-10-22 ENCOUNTER — Other Ambulatory Visit: Payer: Self-pay | Admitting: Radiology

## 2016-10-23 ENCOUNTER — Ambulatory Visit (HOSPITAL_COMMUNITY)
Admission: RE | Admit: 2016-10-23 | Discharge: 2016-10-23 | Disposition: A | Discharge status: Home / Self Care | Payer: Medicare Other | Source: Ambulatory Visit | Attending: Hematology & Oncology | Admitting: Hematology & Oncology

## 2016-10-23 ENCOUNTER — Encounter (HOSPITAL_COMMUNITY): Payer: Self-pay

## 2016-10-23 DIAGNOSIS — K219 Gastro-esophageal reflux disease without esophagitis: Secondary | ICD-10-CM | POA: Insufficient documentation

## 2016-10-23 DIAGNOSIS — Z91041 Radiographic dye allergy status: Secondary | ICD-10-CM | POA: Insufficient documentation

## 2016-10-23 DIAGNOSIS — M898X9 Other specified disorders of bone, unspecified site: Secondary | ICD-10-CM | POA: Diagnosis not present

## 2016-10-23 DIAGNOSIS — R112 Nausea with vomiting, unspecified: Secondary | ICD-10-CM | POA: Diagnosis not present

## 2016-10-23 DIAGNOSIS — M549 Dorsalgia, unspecified: Secondary | ICD-10-CM | POA: Insufficient documentation

## 2016-10-23 DIAGNOSIS — D649 Anemia, unspecified: Secondary | ICD-10-CM | POA: Insufficient documentation

## 2016-10-23 DIAGNOSIS — R51 Headache: Secondary | ICD-10-CM | POA: Diagnosis not present

## 2016-10-23 DIAGNOSIS — Z79899 Other long term (current) drug therapy: Secondary | ICD-10-CM | POA: Insufficient documentation

## 2016-10-23 DIAGNOSIS — Z7984 Long term (current) use of oral hypoglycemic drugs: Secondary | ICD-10-CM | POA: Insufficient documentation

## 2016-10-23 DIAGNOSIS — M797 Fibromyalgia: Secondary | ICD-10-CM | POA: Diagnosis not present

## 2016-10-23 DIAGNOSIS — N289 Disorder of kidney and ureter, unspecified: Secondary | ICD-10-CM | POA: Diagnosis not present

## 2016-10-23 DIAGNOSIS — M1990 Unspecified osteoarthritis, unspecified site: Secondary | ICD-10-CM | POA: Insufficient documentation

## 2016-10-23 DIAGNOSIS — C9000 Multiple myeloma not having achieved remission: Secondary | ICD-10-CM | POA: Insufficient documentation

## 2016-10-23 DIAGNOSIS — E785 Hyperlipidemia, unspecified: Secondary | ICD-10-CM | POA: Diagnosis not present

## 2016-10-23 DIAGNOSIS — E1140 Type 2 diabetes mellitus with diabetic neuropathy, unspecified: Secondary | ICD-10-CM | POA: Insufficient documentation

## 2016-10-23 DIAGNOSIS — M542 Cervicalgia: Secondary | ICD-10-CM | POA: Insufficient documentation

## 2016-10-23 DIAGNOSIS — I10 Essential (primary) hypertension: Secondary | ICD-10-CM | POA: Insufficient documentation

## 2016-10-23 DIAGNOSIS — F419 Anxiety disorder, unspecified: Secondary | ICD-10-CM | POA: Diagnosis not present

## 2016-10-23 LAB — CBC WITH DIFFERENTIAL/PLATELET
BASOS ABS: 0.1 10*3/uL (ref 0.0–0.1)
Basophils Relative: 1 %
EOS PCT: 5 %
Eosinophils Absolute: 0.3 10*3/uL (ref 0.0–0.7)
HCT: 36.2 % — ABNORMAL LOW (ref 39.0–52.0)
Hemoglobin: 11.9 g/dL — ABNORMAL LOW (ref 13.0–17.0)
LYMPHS ABS: 2 10*3/uL (ref 0.7–4.0)
LYMPHS PCT: 29 %
MCH: 24 pg — AB (ref 26.0–34.0)
MCHC: 32.9 g/dL (ref 30.0–36.0)
MCV: 73 fL — AB (ref 78.0–100.0)
MONO ABS: 0.6 10*3/uL (ref 0.1–1.0)
MONOS PCT: 9 %
Neutro Abs: 3.9 10*3/uL (ref 1.7–7.7)
Neutrophils Relative %: 57 %
PLATELETS: 229 10*3/uL (ref 150–400)
RBC: 4.96 MIL/uL (ref 4.22–5.81)
RDW: 16.3 % — AB (ref 11.5–15.5)
WBC: 6.9 10*3/uL (ref 4.0–10.5)

## 2016-10-23 LAB — PROTIME-INR
INR: 1.16
Prothrombin Time: 14.7 seconds (ref 11.4–15.2)

## 2016-10-23 LAB — APTT: aPTT: 30 seconds (ref 24–36)

## 2016-10-23 LAB — GLUCOSE, CAPILLARY: GLUCOSE-CAPILLARY: 130 mg/dL — AB (ref 65–99)

## 2016-10-23 MED ORDER — SODIUM CHLORIDE 0.9 % IV SOLN
INTRAVENOUS | Status: DC
  Administered 2016-10-23: 07:00:00 via INTRAVENOUS

## 2016-10-23 MED ORDER — MIDAZOLAM HCL 2 MG/2ML IJ SOLN
INTRAMUSCULAR | Status: AC
  Filled 2016-10-23: qty 4

## 2016-10-23 MED ORDER — SODIUM CHLORIDE 0.9 % IV SOLN
INTRAVENOUS | Status: AC
  Filled 2016-10-23: qty 250

## 2016-10-23 MED ORDER — FENTANYL CITRATE (PF) 100 MCG/2ML IJ SOLN
INTRAMUSCULAR | Status: AC | PRN
  Administered 2016-10-23 (×2): 50 ug via INTRAVENOUS

## 2016-10-23 MED ORDER — FENTANYL CITRATE (PF) 100 MCG/2ML IJ SOLN
INTRAMUSCULAR | Status: AC
  Filled 2016-10-23: qty 4

## 2016-10-23 MED ORDER — MIDAZOLAM HCL 2 MG/2ML IJ SOLN
INTRAMUSCULAR | Status: AC | PRN
  Administered 2016-10-23 (×2): 1 mg via INTRAVENOUS

## 2016-10-23 NOTE — Discharge Instructions (Signed)
Bone Marrow Aspiration and Bone Marrow Biopsy, Adult, Care After °This sheet gives you information about how to care for yourself after your procedure. Your health care provider may also give you more specific instructions. If you have problems or questions, contact your health care provider. °What can I expect after the procedure? °After the procedure, it is common to have: °· Mild pain and tenderness. °· Swelling. °· Bruising. ° °Follow these instructions at home: °· Take over-the-counter or prescription medicines only as told by your health care provider. °· Do not take baths, swim, or use a hot tub until your health care provider approves. Ask if you can take a shower or have a sponge bath. °· Follow instructions from your health care provider about how to take care of the puncture site. Make sure you: °? Wash your hands with soap and water before you change your bandage (dressing). If soap and water are not available, use hand sanitizer. °? Change your dressing as told by your health care provider. °· Check your puncture site every day for signs of infection. Check for: °? More redness, swelling, or pain. °? More fluid or blood. °? Warmth. °? Pus or a bad smell. °· Return to your normal activities as told by your health care provider. Ask your health care provider what activities are safe for you. °· Do not drive for 24 hours if you were given a medicine to help you relax (sedative). °· Keep all follow-up visits as told by your health care provider. This is important. °Contact a health care provider if: °· You have more redness, swelling, or pain around the puncture site. °· You have more fluid or blood coming from the puncture site. °· Your puncture site feels warm to the touch. °· You have pus or a bad smell coming from the puncture site. °· You have a fever. °· Your pain is not controlled with medicine. °This information is not intended to replace advice given to you by your health care provider. Make sure  you discuss any questions you have with your health care provider. °Document Released: 08/29/2004 Document Revised: 08/30/2015 Document Reviewed: 07/24/2015 °Elsevier Interactive Patient Education © 2018 [employer] °Moderate Conscious Sedation, Adult, Care After °These instructions provide you with information about caring for yourself after your procedure. Your health care provider may also give you more specific instructions. Your treatment has been planned according to current medical practices, but problems sometimes occur. Call your health care provider if you have any problems or questions after your procedure. °What can I expect after the procedure? °After your procedure, it is common: °· To feel sleepy for several hours. °· To feel clumsy and have poor balance for several hours. °· To have poor judgment for several hours. °· To vomit if you eat too soon. ° °Follow these instructions at home: °For at least 24 hours after the procedure: ° °· Do not: °? Participate in activities where you could fall or become injured. °? Drive. °? Use heavy machinery. °? Drink alcohol. °? Take sleeping pills or medicines that cause drowsiness. °? Make important decisions or sign legal documents. °? Take care of children on your own. °· Rest. °Eating and drinking °· Follow the diet recommended by your health care provider. °· If you vomit: °? Drink water, juice, or soup when you can drink without vomiting. °? Make sure you have little or no nausea before eating solid foods. °General instructions °· Have a responsible adult stay with you until you are   awake and alert.  Take over-the-counter and prescription medicines only as told by your health care provider.  If you smoke, do not smoke without supervision.  Keep all follow-up visits as told by your health care provider. This is important. Contact a health care provider if:  You keep feeling nauseous or you keep vomiting.  You feel light-headed.  You develop a  rash.  You have a fever. Get help right away if:  You have trouble breathing. This information is not intended to replace advice given to you by your health care provider. Make sure you discuss any questions you have with your health care provider. Document Released: 11/30/2012 Document Revised: 07/15/2015 Document Reviewed: 06/01/2015 Elsevier Interactive Patient Education  [employer].

## 2016-10-23 NOTE — Procedures (Signed)
Interventional Radiology Procedure Note  Procedure: CT guided aspirate and core biopsy of right iliac bone Complications: None Recommendations: - Bedrest supine x 1 hrs - Follow biopsy results  Aiya Keach T. Brandie Lopes, M.D Pager:  [phone]   

## 2016-10-23 NOTE — H&P (Signed)
Referring Physician(s): Ennever,Peter R  Supervising Physician: Aletta Edouard  Patient Status:  [facility]  Chief Complaint:  "I'm having a bone biopsy"  Subjective: Patient familiar to IR service from prior right chest wall Port-A-Cath placement 2008 followed by revision on 09/23/16. He has a history of IgG kappa myeloma, now with increasing bony pain as well as rising M spike. He presents again today for CT-guided bone marrow biopsy for further evaluation. He currently denies fever, chest pain, dyspnea, cough, or abnormal bleeding. He does have occasional headaches, intermittent abdominal pain, neck and back pain as well as some recent nausea and vomiting. Past Medical History:  Diagnosis Date  . Anemia   . 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 Anemia, iron deficiency 03/12/2011  . Anxiety   . Anxiety disorder   . Arthralgia of multiple joints 12/31/2010  . Arthritis   . Constipation   . Diabetes mellitus without complication ([place])   . Diabetic neuropathy ([place])   . Diverticulosis   . Fibromyalgia   . GERD (gastroesophageal reflux disease)   . Headache   . Hemorrhoids   . Herpes zoster   . Hyperlipidemia   . Hypertension   . Hypotestosteronism 03/17/2012  . Multiple myeloma   . Multiple myeloma ([place])   . Myeloma ([place]) 12/31/2010  . Pneumonia   . Renal insufficiency   . Type 2 diabetes mellitus ([place])   . Urinary retention    Past Surgical History:  Procedure Laterality Date  . CATARACT EXTRACTION     bi-lateral  . COLONOSCOPY    . COLONOSCOPY  07/13/2011   Procedure: COLONOSCOPY;  Surgeon: Ladene Artist, MD,FACG;  Location: [facility];  Service: Endoscopy;  Laterality: N/A;  . ESOPHAGOGASTRODUODENOSCOPY    . IR CHEST FLUORO  09/22/2016  . IR FLUORO GUIDE PORT INSERTION RIGHT  09/23/2016  . IR REMOVAL TUN ACCESS W/ PORT W/O FL MOD SED  09/23/2016  . IR US GUIDE VASC ACCESS LEFT  09/23/2016  . KNEE ARTHROSCOPY     left  . KNEE SURGERY    . PORTACATH PLACEMENT    .  POSTERIOR CERVICAL FUSION/FORAMINOTOMY N/A 06/05/2016   Procedure: LAMINECTOMY CERVICAL 3 - CERVICAL 6 WITH LATERAL MASS FUSION;  Surgeon: Consuella Lose, MD;  Location: [facility];  Service: Neurosurgery;  Laterality: N/A;  LAMINECTOMY CERVICAL 3 - CERVICAL 6 WITH LATERAL MASS FUSION  . TONSILLECTOMY    . TRANSURETHRAL RESECTION OF PROSTATE  01/2011   Dr. at [facility] in [place]      Allergies: Contrast media [iodinated diagnostic agents] and Iodine  Medications: Prior to Admission medications   Medication Sig Start Date End Date Taking? Authorizing Provider  amLODipine (NORVASC) 10 MG tablet Take 10 mg by mouth daily.   Yes [provider]  CARAFATE 1 GM/10ML suspension Take 1 g by mouth 4 (four) times daily.  07/28/11  Yes [provider]  dicyclomine (BENTYL) 10 MG capsule Take 1 capsule (10 mg total) by mouth 3 (three) times daily. 11/16/14  Yes Zehr, Laban Emperor, PA-C  furosemide (LASIX) 40 MG tablet Take 40 mg by mouth. 07/01/16  Yes [provider]  glipiZIDE (GLUCOTROL) 5 MG tablet Take 5 mg by mouth 2 (two) times daily before a meal.    Yes [provider]  isosorbide mononitrate (IMDUR) 30 MG 24 hr tablet Take 30 mg by mouth daily.   Yes [provider]  latanoprost (XALATAN) 0.005 % ophthalmic solution Place 1 drop into the  right eye every evening.    Yes [provider]  Linaclotide Rolan Lipa) 145 MCG CAPS capsule Take 1 capsule (145 mcg total) by mouth daily before breakfast. 11/16/14  Yes Zehr, Janett Billow D, PA-C  losartan (COZAAR) 25 MG tablet Take 25 mg by mouth. 07/01/16  Yes [provider]  meclizine (ANTIVERT) 50 MG tablet Take 0.5 tablets (25 mg total) by mouth 4 (four) times daily as needed for dizziness. 12/13/11  Yes Virgel Manifold, MD  methocarbamol (ROBAXIN) 500 MG tablet Take 500 mg by mouth 4 (four) times daily.   Yes [provider]  metoprolol succinate (TOPROL XL) 50 MG 24 hr tablet Take 50 mg by  mouth daily.  06/05/15  Yes [provider]  mirabegron ER (MYRBETRIQ) 50 MG TB24 tablet Take 50 mg by mouth daily.   Yes [provider]  Morphine-Naltrexone (EMBEDA) 20-0.8 MG CPCR Take 1 capsule by mouth 3 (three) times daily.   Yes [provider]  omeprazole (PRILOSEC) 40 MG capsule take 1 capsule by mouth twice a day 09/29/16  Yes Ladene Artist, MD  ondansetron (ZOFRAN ODT) 4 MG disintegrating tablet Take 1 tablet (4 mg total) by mouth every 8 (eight) hours as needed for nausea or vomiting. 09/20/14  Yes Szekalski, Kaitlyn, PA-C  orphenadrine (NORFLEX) 100 MG tablet Take 1 tablet (100 mg total) by mouth 2 (two) times daily. 05/22/16  Yes Charlesetta Shanks, MD  oxyCODONE (OXYCONTIN) 20 mg 12 hr tablet Take 1 tablet (20 mg total) by mouth every 12 (twelve) hours. 06/09/16  Yes Consuella Lose, MD  oxyCODONE-acetaminophen (PERCOCET) 10-325 MG tablet Take 1 tablet by mouth every 6 (six) hours as needed for pain. 03/23/16  Yes Ennever, Rudell Cobb, MD  oxyCODONE-acetaminophen (PERCOCET) 10-325 MG tablet Take 1 tablet by mouth every 6 (six) hours as needed for pain. 06/09/16 06/09/17 Yes Costella, Vista Mink, PA-C  Polyvinyl Alcohol-Povidone (CLEAR EYES ALL SEASONS) 5-6 MG/ML SOLN Place 2 drops into both eyes 3 (three) times daily as needed (drye eyes.).    Yes [provider]  prochlorperazine (COMPAZINE) 10 MG tablet Take 1 tablet (10 mg total) by mouth every 6 (six) hours as needed for nausea or vomiting. 04/18/14  Yes Ennever, Rudell Cobb, MD  Tafluprost (ZIOPTAN) 0.0015 % SOLN Place 1 drop into both eyes daily.    Yes [provider]  Vitamin D, Ergocalciferol, (DRISDOL) 50000 UNITS CAPS capsule Take 50,000 Units by mouth every Saturday.    Yes [provider]  lidocaine-prilocaine (EMLA) cream Apply 1 application topically as needed. 10/05/16   Volanda Napoleon, MD  potassium chloride SA (K-DUR,KLOR-CON) 20 MEQ tablet Take 1 tablet (20 mEq total) by mouth 2  (two) times daily. Patient not taking: Reported on 05/22/2016 11/07/14   Volanda Napoleon, MD     Vital Signs: Ht _0  (1.803 m)   Wt 198 lb (89.8 kg)   BMI 27.62 kg/m   Blood pressure 152/79, heart rate 73, temperature 98.4, respirations 18, O2 sat 98% RA  Physical Exam awake, alert. Chest clear to auscultation bilaterally. Clean, intact right chest wall Port-A-Cath. Heart with regular rate and rhythm. Abdomen soft, positive bowel sounds, nontender. Trace to 1+ bilateral lower extremity edema.  Imaging: No results found.  Labs:  CBC:  Recent Labs  09/21/16 0811 09/23/16 0955 10/12/16 0801 10/23/16 0721  WBC 7.3 8.0 7.3 6.9  HGB 12.5* 12.5* 12.5* 11.9*  HCT 37.7* 37.1* 37.4* 36.2*  PLT 246 247 242 229  COAGS:  Recent Labs  09/23/16 0955 10/23/16 0721  INR 1.01 1.16  APTT 29 30    BMP:  Recent Labs  05/28/16 0847 06/15/16 2214 07/07/16 1332 07/21/16 0758 08/24/16 0757 09/21/16 0811 10/12/16 0801  NA 137 138 132* 136 136 135 142  K 4.0 3.2* 4.4 4.1 3.8 3.9 4.2  CL 106 103 100 100 107 96* 106  CO2 _0 GLUCOSE 118* 148* 83 160* 172* 134* 135*  BUN 22* 21* 26 25* 16 31* 14  CALCIUM 9.0 8.8* 9.6 9.2 8.7 9.0 8.8  CREATININE 2.34* 2.14* 2.46* 2.7* 2.0* 2.7* 2.3*  GFRNONAA 27* 30* 26*  --   --   --   --   GFRAA 32* 35* 30*  --   --   --   --     LIVER FUNCTION TESTS:  Recent Labs  07/21/16 0758 08/24/16 0757 09/21/16 0811 10/12/16 0801  BILITOT 0.50 0.70 0.50 0.60  AST _1 ALT _2 ALKPHOS 98* 69 79 83  PROT 7.7  8.6* 7.4  7.8 8.2  8.5* 8.0  8.9*  ALBUMIN 3.4 3.4 3.6 3.7    Assessment and Plan: Pt with history of IgG kappa myeloma, now with increasing bony pain as well as rising M spike. He presents  today for CT-guided bone marrow biopsy for further evaluation.Risks and benefits discussed with the patient/sister including, but not limited to bleeding, infection, damage to adjacent structures or low  yield requiring additional tests.All of the patient's questions were answered, patient is agreeable to proceed.Consent signed and in chart.     Electronically Signed: D. Rowe Robert, PA-C 10/23/2016, 8:25 AM   I spent a total of 20 minutes at the the patient's bedside AND on the patient's hospital floor or unit, greater than 50% of which was counseling/coordinating care for CT-guided bone marrow biopsy

## 2016-10-23 NOTE — Addendum Note (Signed)
Addended by: Burney Gauze R on: 10/23/2016 03:27 PM   Modules accepted: Orders

## 2016-10-27 ENCOUNTER — Encounter (HOSPITAL_COMMUNITY)
Admission: RE | Admit: 2016-10-27 | Discharge: 2016-10-27 | Disposition: A | Discharge status: Home / Self Care | Payer: Medicare Other | Source: Ambulatory Visit | Attending: Hematology & Oncology | Admitting: Hematology & Oncology

## 2016-10-27 ENCOUNTER — Encounter (HOSPITAL_COMMUNITY): Payer: Medicare Other

## 2016-10-27 DIAGNOSIS — C9000 Multiple myeloma not having achieved remission: Secondary | ICD-10-CM | POA: Diagnosis not present

## 2016-10-27 LAB — GLUCOSE, CAPILLARY: Glucose-Capillary: 122 mg/dL — ABNORMAL HIGH (ref 65–99)

## 2016-10-27 MED ORDER — FLUDEOXYGLUCOSE F - 18 (FDG) INJECTION
9.7000 | Freq: Once | INTRAVENOUS | Status: AC | PRN
  Administered 2016-10-27: 9.7 via INTRAVENOUS

## 2016-10-28 ENCOUNTER — Telehealth: Payer: Self-pay | Admitting: *Deleted

## 2016-10-28 NOTE — Telephone Encounter (Signed)
-----   Message from Volanda Napoleon, MD sent at 10/27/2016  4:41 PM EDT ----- Call - PET scan is negative for myeloma in the bones!!  Laurey Arrow

## 2016-11-02 ENCOUNTER — Ambulatory Visit (HOSPITAL_BASED_OUTPATIENT_CLINIC_OR_DEPARTMENT_OTHER): Payer: Medicare Other

## 2016-11-02 VITALS — BP 143/68 | HR 72 | Temp 98.8°F | Resp 18

## 2016-11-02 DIAGNOSIS — Z23 Encounter for immunization: Secondary | ICD-10-CM | POA: Diagnosis not present

## 2016-11-02 DIAGNOSIS — E349 Endocrine disorder, unspecified: Secondary | ICD-10-CM | POA: Diagnosis not present

## 2016-11-02 MED ORDER — TESTOSTERONE CYPIONATE 200 MG/ML IM SOLN
300.0000 mg | INTRAMUSCULAR | Status: DC
Start: 2016-11-02 — End: 2016-11-02
  Administered 2016-11-02: 300 mg via INTRAMUSCULAR

## 2016-11-02 MED ORDER — TESTOSTERONE CYPIONATE 200 MG/ML IM SOLN
INTRAMUSCULAR | Status: AC
  Filled 2016-11-02: qty 2

## 2016-11-02 MED ORDER — INFLUENZA VAC SPLIT QUAD 0.5 ML IM SUSY
PREFILLED_SYRINGE | INTRAMUSCULAR | Status: AC
  Filled 2016-11-02: qty 0.5

## 2016-11-02 MED ORDER — INFLUENZA VAC SPLIT QUAD 0.5 ML IM SUSY
0.5000 mL | PREFILLED_SYRINGE | Freq: Once | INTRAMUSCULAR | Status: AC
  Administered 2016-11-02: 0.5 mL via INTRAMUSCULAR

## 2016-11-02 NOTE — Patient Instructions (Signed)
Testosterone injection What is this medicine? TESTOSTERONE (tes TOS ter one) is the main male hormone. It supports normal male development such as muscle growth, facial hair, and deep voice. It is used in males to treat low testosterone levels. This medicine may be used for other purposes; ask your health care provider or pharmacist if you have questions. COMMON BRAND NAME(S): Andro-L.A., Aveed, Delatestryl, Depo-Testosterone, Virilon What should I tell my health care provider before I take this medicine? They need to know if you have any of these conditions: -cancer -diabetes -heart disease -kidney disease -liver disease -lung disease -prostate disease -an unusual or allergic reaction to testosterone, other medicines, foods, dyes, or preservatives -pregnant or trying to get pregnant -breast-feeding How should I use this medicine? This medicine is for injection into a muscle. It is usually given by a health care professional in a hospital or clinic setting. Contact your pediatrician regarding the use of this medicine in children. While this medicine may be prescribed for children as young as 12 years of age for selected conditions, precautions do apply. Overdosage: If you think you have taken too much of this medicine contact a poison control center or emergency room at once. NOTE: This medicine is only for you. Do not share this medicine with others. What if I miss a dose? Try not to miss a dose. Your doctor or health care professional will tell you when your next injection is due. Notify the office if you are unable to keep an appointment. What may interact with this medicine? -medicines for diabetes -medicines that treat or prevent blood clots like warfarin -oxyphenbutazone -propranolol -steroid medicines like prednisone or cortisone This list may not describe all possible interactions. Give your health care provider a list of all the medicines, herbs, non-prescription drugs, or  dietary supplements you use. Also tell them if you smoke, drink alcohol, or use illegal drugs. Some items may interact with your medicine. What should I watch for while using this medicine? Visit your doctor or health care professional for regular checks on your progress. They will need to check the level of testosterone in your blood. This medicine is only approved for use in men who have low levels of testosterone related to certain medical conditions. Heart attacks and strokes have been reported with the use of this medicine. Notify your doctor or health care professional and seek emergency treatment if you develop breathing problems; changes in vision; confusion; chest pain or chest tightness; sudden arm pain; severe, sudden headache; trouble speaking or understanding; sudden numbness or weakness of the face, arm or leg; loss of balance or coordination. Talk to your doctor about the risks and benefits of this medicine. This medicine may affect blood sugar levels. If you have diabetes, check with your doctor or health care professional before you change your diet or the dose of your diabetic medicine. Testosterone injections are not commonly used in women. Women should inform their doctor if they wish to become pregnant or think they might be pregnant. There is a potential for serious side effects to an unborn child. Talk to your health care professional or pharmacist for more information. Talk with your doctor or health care professional about your birth control options while taking this medicine. This drug is banned from use in athletes by most athletic organizations. What side effects may I notice from receiving this medicine? Side effects that you should report to your doctor or health care professional as soon as possible: -allergic reactions like skin rash,   itching or hives, swelling of the face, lips, or tongue -breast enlargement -breathing problems -changes in emotions or moods -deep or  hoarse voice -irregular menstrual periods -signs and symptoms of liver injury like dark yellow or brown urine; general ill feeling or flu-like symptoms; light-colored stools; loss of appetite; nausea; right upper belly pain; unusually weak or tired; yellowing of the eyes or skin -stomach pain -swelling of the ankles, feet, hands -too frequent or persistent erections -trouble passing urine or change in the amount of urine Side effects that usually do not require medical attention (report to your doctor or health care professional if they continue or are bothersome): -acne -change in sex drive or performance -facial hair growth -hair loss -headache This list may not describe all possible side effects. Call your doctor for medical advice about side effects. You may report side effects to FDA at 1-800-FDA-1088. Where should I keep my medicine? Keep out of the reach of children. This medicine can be abused. Keep your medicine in a safe place to protect it from theft. Do not share this medicine with anyone. Selling or giving away this medicine is dangerous and against the law. Store at room temperature between 20 and 25 degrees C (68 and 77 degrees F). Do not freeze. Protect from light. Follow the directions for the product you are prescribed. Throw away any unused medicine after the expiration date. NOTE: This sheet is a summary. It may not cover all possible information. If you have questions about this medicine, talk to your doctor, pharmacist, or health care provider.  2018 Elsevier/Gold Standard (2015-03-16 07:33:55)  

## 2016-11-09 ENCOUNTER — Ambulatory Visit (HOSPITAL_BASED_OUTPATIENT_CLINIC_OR_DEPARTMENT_OTHER): Payer: Medicare Other | Admitting: Hematology & Oncology

## 2016-11-09 ENCOUNTER — Ambulatory Visit: Payer: Medicare Other

## 2016-11-09 ENCOUNTER — Ambulatory Visit (HOSPITAL_BASED_OUTPATIENT_CLINIC_OR_DEPARTMENT_OTHER): Payer: Medicare Other

## 2016-11-09 ENCOUNTER — Other Ambulatory Visit (HOSPITAL_BASED_OUTPATIENT_CLINIC_OR_DEPARTMENT_OTHER): Payer: Medicare Other

## 2016-11-09 VITALS — BP 146/73 | HR 76 | Temp 98.4°F | Resp 18 | Wt 199.0 lb

## 2016-11-09 DIAGNOSIS — C9000 Multiple myeloma not having achieved remission: Secondary | ICD-10-CM

## 2016-11-09 DIAGNOSIS — D631 Anemia in chronic kidney disease: Secondary | ICD-10-CM

## 2016-11-09 DIAGNOSIS — D500 Iron deficiency anemia secondary to blood loss (chronic): Secondary | ICD-10-CM

## 2016-11-09 DIAGNOSIS — E349 Endocrine disorder, unspecified: Secondary | ICD-10-CM

## 2016-11-09 DIAGNOSIS — Z299 Encounter for prophylactic measures, unspecified: Secondary | ICD-10-CM

## 2016-11-09 DIAGNOSIS — N189 Chronic kidney disease, unspecified: Secondary | ICD-10-CM

## 2016-11-09 LAB — CMP (CANCER CENTER ONLY)
ALBUMIN: 3.5 g/dL (ref 3.3–5.5)
ALT(SGPT): 17 U/L (ref 10–47)
AST: 20 U/L (ref 11–38)
Alkaline Phosphatase: 77 U/L (ref 26–84)
BUN, Bld: 18 mg/dL (ref 7–22)
CALCIUM: 9.2 mg/dL (ref 8.0–10.3)
CHLORIDE: 100 meq/L (ref 98–108)
CO2: 28 meq/L (ref 18–33)
Creat: 2.5 mg/dl — ABNORMAL HIGH (ref 0.6–1.2)
GLUCOSE: 170 mg/dL — AB (ref 73–118)
Potassium: 4.6 mEq/L (ref 3.3–4.7)
SODIUM: 137 meq/L (ref 128–145)
Total Bilirubin: 0.5 mg/dl (ref 0.20–1.60)
Total Protein: 8.5 g/dL — ABNORMAL HIGH (ref 6.4–8.1)

## 2016-11-09 LAB — CBC WITH DIFFERENTIAL (CANCER CENTER ONLY)
BASO#: 0.1 10*3/uL (ref 0.0–0.2)
BASO%: 0.5 % (ref 0.0–2.0)
EOS ABS: 0.3 10*3/uL (ref 0.0–0.5)
EOS%: 2.7 % (ref 0.0–7.0)
HEMATOCRIT: 35.8 % — AB (ref 38.7–49.9)
HEMOGLOBIN: 11.9 g/dL — AB (ref 13.0–17.1)
LYMPH#: 1.6 10*3/uL (ref 0.9–3.3)
LYMPH%: 16.7 % (ref 14.0–48.0)
MCH: 24.5 pg — ABNORMAL LOW (ref 28.0–33.4)
MCHC: 33.2 g/dL (ref 32.0–35.9)
MCV: 74 fL — ABNORMAL LOW (ref 82–98)
MONO#: 0.9 10*3/uL (ref 0.1–0.9)
MONO%: 10 % (ref 0.0–13.0)
NEUT%: 70.1 % (ref 40.0–80.0)
NEUTROS ABS: 6.5 10*3/uL (ref 1.5–6.5)
Platelets: 283 10*3/uL (ref 145–400)
RBC: 4.85 10*6/uL (ref 4.20–5.70)
RDW: 15.7 % (ref 11.1–15.7)
WBC: 9.3 10*3/uL (ref 4.0–10.0)

## 2016-11-09 MED ORDER — SODIUM CHLORIDE 0.9 % IV SOLN
Freq: Once | INTRAVENOUS | Status: AC
  Administered 2016-11-09: 09:00:00 via INTRAVENOUS

## 2016-11-09 MED ORDER — HEPARIN SOD (PORK) LOCK FLUSH 100 UNIT/ML IV SOLN
500.0000 [IU] | Freq: Once | INTRAVENOUS | Status: AC
  Administered 2016-11-09: 500 [IU] via INTRAVENOUS
  Filled 2016-11-09: qty 5

## 2016-11-09 MED ORDER — MORPHINE SULFATE (PF) 4 MG/ML IV SOLN
2.0000 mg | Freq: Once | INTRAVENOUS | Status: AC
  Administered 2016-11-09: 2 mg via INTRAVENOUS

## 2016-11-09 MED ORDER — MORPHINE SULFATE (PF) 4 MG/ML IV SOLN
INTRAVENOUS | Status: AC
  Filled 2016-11-09: qty 1

## 2016-11-09 MED ORDER — SODIUM CHLORIDE 0.9% FLUSH
10.0000 mL | INTRAVENOUS | Status: DC | PRN
  Administered 2016-11-09: 10 mL via INTRAVENOUS
  Filled 2016-11-09: qty 10

## 2016-11-09 MED ORDER — SODIUM CHLORIDE 0.9 % IV SOLN
60.0000 mg | Freq: Once | INTRAVENOUS | Status: AC
  Administered 2016-11-09: 60 mg via INTRAVENOUS
  Filled 2016-11-09: qty 20

## 2016-11-09 NOTE — Progress Notes (Signed)
Hematology and Oncology Follow Up Visit  [name] [id number] August 18, 1950 66 y.o. 11/09/2016   Principle Diagnosis:  1. IgG kappa myeloma. 2. Anemia secondary to renal insufficiency. 3. Intermittent iron-deficiency anemia. 4. Hypotestosteronemia  Current Therapy:   1. Aredia 60 mg IV q.2 months  2. Aranesp 300 mcg subcu as needed for hemoglobin less than 11. 3. Depo-Testosterone 300 mg q.4 weeks. 4. IV iron as indicated.     Interim History:  [name] is back for followup. His heart is a if myeloma is causing his problems. We did go ahead and do a bone marrow biopsy on him. This was done on August 31. The pathology report ([id number]) showed only 6% plasma cells. The cytogenetics and FISH on with normal chromosomes.  He still has occasional bony pain. He overdid a PET scan on him. The PET scan did not show any active myelomatous lesions in his bones.  He does have bad arthritis. He does have some fibromyalgia.  His last myeloma studies shows M spike to be 1.2 g/dL. His IgG level was 2067 mg/dL. His Kappa light chain was 3.7 mg/dL.   His blood sugars are still a little on the high side.  His testosterone level have been on the high side. As such, we only doing his testosterone supplementation every month now.   He's had no issues with nausea or vomiting.  He's had no fever. He's had no cough or shortness of breath. He's had no leg swelling. He's had no change in bowel or bladder habits.  Overall, his performance status is ECOG 1.  Medications:  Current Outpatient Prescriptions:  .  amLODipine (NORVASC) 10 MG tablet, Take 10 mg by mouth daily., Disp: , Rfl:  .  CARAFATE 1 GM/10ML suspension, Take 1 g by mouth 4 (four) times daily. , Disp: , Rfl:  .  dicyclomine (BENTYL) 10 MG capsule, Take 1 capsule (10 mg total) by mouth 3 (three) times daily., Disp: 90 capsule, Rfl: 2 .  furosemide (LASIX) 40 MG tablet, Take 40 mg by mouth., Disp: , Rfl:  .  glipiZIDE (GLUCOTROL) 5  MG tablet, Take 5 mg by mouth 2 (two) times daily before a meal. , Disp: , Rfl:  .  isosorbide mononitrate (IMDUR) 30 MG 24 hr tablet, Take 30 mg by mouth daily., Disp: , Rfl:  .  latanoprost (XALATAN) 0.005 % ophthalmic solution, Place 1 drop into the right eye every evening. , Disp: , Rfl:  .  lidocaine-prilocaine (EMLA) cream, Apply 1 application topically as needed., Disp: 30 g, Rfl: 3 .  Linaclotide (LINZESS) 145 MCG CAPS capsule, Take 1 capsule (145 mcg total) by mouth daily before breakfast., Disp: 30 capsule, Rfl: 2 .  losartan (COZAAR) 25 MG tablet, Take 25 mg by mouth., Disp: , Rfl:  .  meclizine (ANTIVERT) 50 MG tablet, Take 0.5 tablets (25 mg total) by mouth 4 (four) times daily as needed for dizziness., Disp: 30 tablet, Rfl: 0 .  methocarbamol (ROBAXIN) 500 MG tablet, Take 500 mg by mouth 4 (four) times daily., Disp: , Rfl:  .  metoprolol succinate (TOPROL XL) 50 MG 24 hr tablet, Take 50 mg by mouth daily. , Disp: , Rfl:  .  mirabegron ER (MYRBETRIQ) 50 MG TB24 tablet, Take 50 mg by mouth daily., Disp: , Rfl:  .  Morphine-Naltrexone (EMBEDA) 20-0.8 MG CPCR, Take 1 capsule by mouth 3 (three) times daily., Disp: , Rfl:  .  omeprazole (PRILOSEC) 40 MG capsule, take 1 capsule by mouth  twice a day, Disp: 180 capsule, Rfl: 0 .  ondansetron (ZOFRAN ODT) 4 MG disintegrating tablet, Take 1 tablet (4 mg total) by mouth every 8 (eight) hours as needed for nausea or vomiting., Disp: 10 tablet, Rfl: 0 .  orphenadrine (NORFLEX) 100 MG tablet, Take 1 tablet (100 mg total) by mouth 2 (two) times daily., Disp: 30 tablet, Rfl: 0 .  oxyCODONE (OXYCONTIN) 20 mg 12 hr tablet, Take 1 tablet (20 mg total) by mouth every 12 (twelve) hours., Disp: 14 tablet, Rfl: 0 .  oxyCODONE-acetaminophen (PERCOCET) 10-325 MG tablet, Take 1 tablet by mouth every 6 (six) hours as needed for pain., Disp: 30 tablet, Rfl: 0 .  oxyCODONE-acetaminophen (PERCOCET) 10-325 MG tablet, Take 1 tablet by mouth every 6 (six) hours as  needed for pain., Disp: 60 tablet, Rfl: 0 .  Polyvinyl Alcohol-Povidone (CLEAR EYES ALL SEASONS) 5-6 MG/ML SOLN, Place 2 drops into both eyes 3 (three) times daily as needed (drye eyes.). , Disp: , Rfl:  .  potassium chloride SA (K-DUR,KLOR-CON) 20 MEQ tablet, Take 1 tablet (20 mEq total) by mouth 2 (two) times daily. (Patient not taking: Reported on 05/22/2016), Disp: 60 tablet, Rfl: 6 .  prochlorperazine (COMPAZINE) 10 MG tablet, Take 1 tablet (10 mg total) by mouth every 6 (six) hours as needed for nausea or vomiting., Disp: 60 tablet, Rfl: 2 .  Tafluprost (ZIOPTAN) 0.0015 % SOLN, Place 1 drop into both eyes daily. , Disp: , Rfl:  .  Vitamin D, Ergocalciferol, (DRISDOL) 50000 UNITS CAPS capsule, Take 50,000 Units by mouth every Saturday. , Disp: , Rfl:  No current facility-administered medications for this visit.   Facility-Administered Medications Ordered in Other Visits:  .  morphine 4 MG/ML injection 4 mg, 4 mg, Intravenous, Q4H PRN, Volanda Napoleon, MD  Allergies:  Allergies  Allergen Reactions  . Contrast Media [Iodinated Diagnostic Agents]     Patient states he was instructed not to take IV contrast.  In 2008 he had an unknown reaction, and was told not to take it again.  He was also told not to take it due to his kidneys.  . Iodine Anxiety and Rash    Didn't feel right "instructed not to take per MD--something with his port"    Past Medical History, Surgical history, Social history, and Family History were reviewed and updated.  Review of Systems: As stated in the interim history  Physical Exam:  weight is 199 lb (90.3 kg). His oral temperature is 98.4 F (36.9 C). His blood pressure is 146/73 (abnormal) and his pulse is 76. His respiration is 18 and oxygen saturation is 98%.   Physical Exam  Constitutional: He is oriented to person, place, and time.  HENT:  Head: Normocephalic and atraumatic.  Mouth/Throat: Oropharynx is clear and moist.  Eyes: Pupils are equal, round,  and reactive to light. EOM are normal.  Neck: Normal range of motion.  Cardiovascular: Normal rate, regular rhythm and normal heart sounds.   Pulmonary/Chest: Effort normal and breath sounds normal.  Abdominal: Soft. Bowel sounds are normal.  Musculoskeletal: Normal range of motion. He exhibits no edema, tenderness or deformity.  Lymphadenopathy:    He has no cervical adenopathy.  Neurological: He is alert and oriented to person, place, and time.  Skin: Skin is warm and dry. No rash noted. No erythema.  Psychiatric: He has a normal mood and affect. His behavior is normal. Judgment and thought content normal.  Vitals reviewed.    Lab Results  Component Value  Date   WBC 9.3 11/09/2016   HGB 11.9 (L) 11/09/2016   HCT 35.8 (L) 11/09/2016   MCV 74 (L) 11/09/2016   PLT 283 11/09/2016     Chemistry      Component Value Date/Time   NA 137 11/09/2016 0741   K 4.6 11/09/2016 0741   CL 100 11/09/2016 0741   CO2 28 11/09/2016 0741   BUN 18 11/09/2016 0741   CREATININE 2.5 (H) 11/09/2016 0741      Component Value Date/Time   CALCIUM 9.2 11/09/2016 0741   ALKPHOS 77 11/09/2016 0741   AST 20 11/09/2016 0741   ALT 17 11/09/2016 0741   BILITOT 0.50 11/09/2016 0741         Impression and Plan: [name] is a 66 year old gentleman. He has multiple medical problems.   For right now, I think a week is still watch him without any therapy. His M spike is really holding steady. He only has 6% plasma cells in the bone marrow biopsy. I realize that sometimes this can be positional.  We will go ahead and plan to get him back in one more month.  I still have to watch him closely. I still would have a very low threshold for treating him.  Volanda Napoleon, MD 9/17/20188:53 AM

## 2016-11-09 NOTE — Patient Instructions (Signed)
Pamidronate injection What is this medicine? PAMIDRONATE (pa mi DROE nate) slows calcium loss from bones. It is used to treat high calcium blood levels from cancer or Paget's disease. It is also used to treat bone pain and prevent fractures from certain cancers that have spread to the bone. This medicine may be used for other purposes; ask your health care provider or pharmacist if you have questions. COMMON BRAND NAME(S): Aredia What should I tell my health care provider before I take this medicine? They need to know if you have any of these conditions: -aspirin-sensitive asthma -dental disease -kidney disease -an unusual or allergic reaction to pamidronate, other medicines, foods, dyes, or preservatives -pregnant or trying to get pregnant -breast-feeding How should I use this medicine? This medicine is for infusion into a vein. It is given by a health care professional in a hospital or clinic setting. Talk to your pediatrician regarding the use of this medicine in children. This medicine is not approved for use in children. Overdosage: If you think you have taken too much of this medicine contact a poison control center or emergency room at once. NOTE: This medicine is only for you. Do not share this medicine with others. What if I miss a dose? This does not apply. What may interact with this medicine? -certain antibiotics given by injection -medicines for inflammation or pain like ibuprofen, naproxen -some diuretics like bumetanide, furosemide -cyclosporine -parathyroid hormone -tacrolimus -teriparatide -thalidomide This list may not describe all possible interactions. Give your health care provider a list of all the medicines, herbs, non-prescription drugs, or dietary supplements you use. Also tell them if you smoke, drink alcohol, or use illegal drugs. Some items may interact with your medicine. What should I watch for while using this medicine? Visit your doctor or health care  professional for regular checkups. It may be some time before you see the benefit from this medicine. Do not stop taking your medicine unless your doctor tells you to. Your doctor may order blood tests or other tests to see how you are doing. Women should inform their doctor if they wish to become pregnant or think they might be pregnant. There is a potential for serious side effects to an unborn child. Talk to your health care professional or pharmacist for more information. You should make sure that you get enough calcium and vitamin D while you are taking this medicine. Discuss the foods you eat and the vitamins you take with your health care professional. Some people who take this medicine have severe bone, joint, and/or muscle pain. This medicine may also increase your risk for a broken thigh bone. Tell your doctor right away if you have pain in your upper leg or groin. Tell your doctor if you have any pain that does not go away or that gets worse. What side effects may I notice from receiving this medicine? Side effects that you should report to your doctor or health care professional as soon as possible: -allergic reactions like skin rash, itching or hives, swelling of the face, lips, or tongue -black or tarry stools -changes in vision -eye inflammation, pain -high blood pressure -jaw pain, especially burning or cramping -muscle weakness -numb, tingling pain -swelling of feet or hands -trouble passing urine or change in the amount of urine -unable to move easily Side effects that usually do not require medical attention (report to your doctor or health care professional if they continue or are bothersome): -bone, joint, or muscle pain -constipation -dizzy, drowsy -  fever -headache -loss of appetite -nausea, vomiting -pain at site where injected This list may not describe all possible side effects. Call your doctor for medical advice about side effects. You may report side effects to  FDA at 1-800-FDA-1088. Where should I keep my medicine? This drug is given in a hospital or clinic and will not be stored at home. NOTE: This sheet is a summary. It may not cover all possible information. If you have questions about this medicine, talk to your doctor, pharmacist, or health care provider.  2018 Elsevier/Gold Standard (2010-08-08 08:49:49)  

## 2016-11-09 NOTE — Progress Notes (Signed)
OK to give Aredia one month early per order of Dr. [name].

## 2016-11-10 LAB — KAPPA/LAMBDA LIGHT CHAINS
IG KAPPA FREE LIGHT CHAIN: 55.1 mg/L — AB (ref 3.3–19.4)
Ig Lambda Free Light Chain: 31.1 mg/L — ABNORMAL HIGH (ref 5.7–26.3)
Kappa/Lambda FluidC Ratio: 1.77 — ABNORMAL HIGH (ref 0.26–1.65)

## 2016-11-10 LAB — IGG, IGA, IGM
IGM (IMMUNOGLOBIN M), SRM: 59 mg/dL (ref 20–172)
IgA, Qn, Serum: 297 mg/dL (ref 61–437)

## 2016-11-12 LAB — PROTEIN ELECTROPHORESIS, SERUM, WITH REFLEX
A/G RATIO SPE: 0.7 (ref 0.7–1.7)
ALPHA 1: 0.2 g/dL (ref 0.0–0.4)
Albumin: 3.3 g/dL (ref 2.9–4.4)
Alpha 2: 1.2 g/dL — ABNORMAL HIGH (ref 0.4–1.0)
Beta: 1 g/dL (ref 0.7–1.3)
GLOBULIN, TOTAL: 4.6 g/dL — AB (ref 2.2–3.9)
Gamma Globulin: 2.1 g/dL — ABNORMAL HIGH (ref 0.4–1.8)
Interpretation(See Below): 0
M-SPIKE, %: 1.6 g/dL — AB
TOTAL PROTEIN: 7.9 g/dL (ref 6.0–8.5)

## 2016-11-13 LAB — UIFE/LIGHT CHAINS/TP QN, 24-HR UR
FR KAPPA LT CH,24HR: 188 mg/24 hr
FR LAMBDA LT CH,24HR: 12 mg/(24.h)
FREE KAPPA LT CHAINS, UR: 221 mg/L — AB (ref 1.35–24.19)
FREE LAMBDA LT CHAINS, UR: 13.9 mg/L — AB (ref 0.24–6.66)
Kappa/Lambda Ratio,U: 15.9 — ABNORMAL HIGH (ref 2.04–10.37)
PROTEIN 24H UR: 463 mg/(24.h) — AB (ref 30–150)
PROTEIN,TOTAL,URINE: 54.5 mg/dL

## 2016-11-16 ENCOUNTER — Ambulatory Visit (HOSPITAL_BASED_OUTPATIENT_CLINIC_OR_DEPARTMENT_OTHER): Payer: Medicare Other

## 2016-11-16 VITALS — BP 127/58 | Temp 98.4°F | Resp 17

## 2016-11-16 DIAGNOSIS — E349 Endocrine disorder, unspecified: Secondary | ICD-10-CM

## 2016-11-16 MED ORDER — TESTOSTERONE CYPIONATE 200 MG/ML IM SOLN
INTRAMUSCULAR | Status: AC
  Filled 2016-11-16: qty 2

## 2016-11-16 MED ORDER — TESTOSTERONE CYPIONATE 200 MG/ML IM SOLN
300.0000 mg | INTRAMUSCULAR | Status: DC
  Administered 2016-11-16: 300 mg via INTRAMUSCULAR

## 2016-11-17 ENCOUNTER — Encounter (HOSPITAL_COMMUNITY): Payer: Self-pay

## 2016-11-18 ENCOUNTER — Encounter: Payer: Self-pay | Admitting: Hematology & Oncology

## 2016-11-20 LAB — TISSUE HYBRIDIZATION (BONE MARROW)-NCBH

## 2016-11-20 LAB — CHROMOSOME ANALYSIS, BONE MARROW

## 2016-11-30 ENCOUNTER — Ambulatory Visit (HOSPITAL_BASED_OUTPATIENT_CLINIC_OR_DEPARTMENT_OTHER): Payer: Medicare Other

## 2016-11-30 VITALS — BP 152/78 | HR 96 | Temp 98.4°F | Resp 18

## 2016-11-30 DIAGNOSIS — E349 Endocrine disorder, unspecified: Secondary | ICD-10-CM | POA: Diagnosis present

## 2016-11-30 MED ORDER — TESTOSTERONE CYPIONATE 200 MG/ML IM SOLN
INTRAMUSCULAR | Status: AC
  Filled 2016-11-30: qty 2

## 2016-11-30 MED ORDER — TESTOSTERONE CYPIONATE 200 MG/ML IM SOLN
300.0000 mg | INTRAMUSCULAR | Status: DC
  Administered 2016-11-30: 300 mg via INTRAMUSCULAR

## 2016-12-07 ENCOUNTER — Ambulatory Visit (HOSPITAL_BASED_OUTPATIENT_CLINIC_OR_DEPARTMENT_OTHER): Payer: Medicare Other | Admitting: Hematology & Oncology

## 2016-12-07 ENCOUNTER — Ambulatory Visit: Payer: Medicare Other

## 2016-12-07 ENCOUNTER — Ambulatory Visit (HOSPITAL_BASED_OUTPATIENT_CLINIC_OR_DEPARTMENT_OTHER): Payer: Medicare Other

## 2016-12-07 ENCOUNTER — Other Ambulatory Visit (HOSPITAL_BASED_OUTPATIENT_CLINIC_OR_DEPARTMENT_OTHER): Payer: Medicare Other

## 2016-12-07 VITALS — BP 134/60 | HR 68 | Temp 98.5°F | Resp 18 | Wt 198.0 lb

## 2016-12-07 DIAGNOSIS — N189 Chronic kidney disease, unspecified: Secondary | ICD-10-CM

## 2016-12-07 DIAGNOSIS — I10 Essential (primary) hypertension: Secondary | ICD-10-CM | POA: Diagnosis not present

## 2016-12-07 DIAGNOSIS — D500 Iron deficiency anemia secondary to blood loss (chronic): Secondary | ICD-10-CM

## 2016-12-07 DIAGNOSIS — E119 Type 2 diabetes mellitus without complications: Secondary | ICD-10-CM | POA: Diagnosis not present

## 2016-12-07 DIAGNOSIS — E349 Endocrine disorder, unspecified: Secondary | ICD-10-CM

## 2016-12-07 DIAGNOSIS — C9000 Multiple myeloma not having achieved remission: Secondary | ICD-10-CM

## 2016-12-07 DIAGNOSIS — D631 Anemia in chronic kidney disease: Secondary | ICD-10-CM

## 2016-12-07 DIAGNOSIS — R944 Abnormal results of kidney function studies: Secondary | ICD-10-CM | POA: Diagnosis not present

## 2016-12-07 LAB — CBC WITH DIFFERENTIAL (CANCER CENTER ONLY)
BASO#: 0 10*3/uL (ref 0.0–0.2)
BASO%: 0.5 % (ref 0.0–2.0)
EOS ABS: 0.2 10*3/uL (ref 0.0–0.5)
EOS%: 3.5 % (ref 0.0–7.0)
HCT: 34.2 % — ABNORMAL LOW (ref 38.7–49.9)
HEMOGLOBIN: 11.4 g/dL — AB (ref 13.0–17.1)
LYMPH#: 1.5 10*3/uL (ref 0.9–3.3)
LYMPH%: 25 % (ref 14.0–48.0)
MCH: 24.4 pg — ABNORMAL LOW (ref 28.0–33.4)
MCHC: 33.3 g/dL (ref 32.0–35.9)
MCV: 73 fL — ABNORMAL LOW (ref 82–98)
MONO#: 0.8 10*3/uL (ref 0.1–0.9)
MONO%: 13.5 % — ABNORMAL HIGH (ref 0.0–13.0)
NEUT#: 3.4 10*3/uL (ref 1.5–6.5)
NEUT%: 57.5 % (ref 40.0–80.0)
Platelets: 217 10*3/uL (ref 145–400)
RBC: 4.68 10*6/uL (ref 4.20–5.70)
RDW: 16.3 % — ABNORMAL HIGH (ref 11.1–15.7)
WBC: 5.9 10*3/uL (ref 4.0–10.0)

## 2016-12-07 LAB — CMP (CANCER CENTER ONLY)
ALBUMIN: 3.4 g/dL (ref 3.3–5.5)
ALT(SGPT): 20 U/L (ref 10–47)
AST: 28 U/L (ref 11–38)
Alkaline Phosphatase: 66 U/L (ref 26–84)
BUN, Bld: 24 mg/dL — ABNORMAL HIGH (ref 7–22)
CALCIUM: 9 mg/dL (ref 8.0–10.3)
CO2: 28 meq/L (ref 18–33)
Chloride: 104 mEq/L (ref 98–108)
Creat: 3 mg/dl — ABNORMAL HIGH (ref 0.6–1.2)
Glucose, Bld: 101 mg/dL (ref 73–118)
Potassium: 3.9 mEq/L (ref 3.3–4.7)
Sodium: 148 mEq/L — ABNORMAL HIGH (ref 128–145)
Total Bilirubin: 0.5 mg/dl (ref 0.20–1.60)
Total Protein: 8 g/dL (ref 6.4–8.1)

## 2016-12-07 LAB — IRON AND TIBC
%SAT: 23 % (ref 20–55)
IRON: 46 ug/dL (ref 42–163)
TIBC: 198 ug/dL — AB (ref 202–409)
UIBC: 151 ug/dL (ref 117–376)

## 2016-12-07 LAB — FERRITIN

## 2016-12-07 MED ORDER — SODIUM CHLORIDE 0.9% FLUSH
10.0000 mL | INTRAVENOUS | Status: DC | PRN
  Administered 2016-12-07: 10 mL via INTRAVENOUS
  Filled 2016-12-07: qty 10

## 2016-12-07 MED ORDER — SODIUM CHLORIDE 0.9 % IV SOLN
60.0000 mg | Freq: Once | INTRAVENOUS | Status: DC
  Filled 2016-12-07: qty 10

## 2016-12-07 MED ORDER — HEPARIN SOD (PORK) LOCK FLUSH 100 UNIT/ML IV SOLN
500.0000 [IU] | Freq: Once | INTRAVENOUS | Status: AC
  Administered 2016-12-07: 500 [IU] via INTRAVENOUS
  Filled 2016-12-07: qty 5
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
## 2016-12-07 NOTE — Patient Instructions (Signed)
Implanted Port Insertion, Care After °This sheet gives you information about how to care for yourself after your procedure. Your health care provider may also give you more specific instructions. If you have problems or questions, contact your health care provider. °What can I expect after the procedure? °After your procedure, it is common to have: °· Discomfort at the port insertion site. °· Bruising on the skin over the port. This should improve over 3-4 days. ° °Follow these instructions at home: °Port care °· After your port is placed, you will get a manufacturer's information card. The card has information about your port. Keep this card with you at all times. °· Take care of the port as told by your health care provider. Ask your health care provider if you or a family member can get training for taking care of the port at home. A home health care nurse may also take care of the port. °· Make sure to remember what type of port you have. °Incision care °· Follow instructions from your health care provider about how to take care of your port insertion site. Make sure you: °? Wash your hands with soap and water before you change your bandage (dressing). If soap and water are not available, use hand sanitizer. °? Change your dressing as told by your health care provider. °? Leave stitches (sutures), skin glue, or adhesive strips in place. These skin closures may need to stay in place for 2 weeks or longer. If adhesive strip edges start to loosen and curl up, you may trim the loose edges. Do not remove adhesive strips completely unless your health care provider tells you to do that. °· Check your port insertion site every day for signs of infection. Check for: °? More redness, swelling, or pain. °? More fluid or blood. °? Warmth. °? Pus or a bad smell. °General instructions °· Do not take baths, swim, or use a hot tub until your health care provider approves. °· Do not lift anything that is heavier than 10 lb (4.5  kg) for a week, or as told by your health care provider. °· Ask your health care provider when it is okay to: °? Return to work or school. °? Resume usual physical activities or sports. °· Do not drive for 24 hours if you were given a medicine to help you relax (sedative). °· Take over-the-counter and prescription medicines only as told by your health care provider. °· Wear a medical alert bracelet in case of an emergency. This will tell any health care providers that you have a port. °· Keep all follow-up visits as told by your health care provider. This is important. °Contact a health care provider if: °· You cannot flush your port with saline as directed, or you cannot draw blood from the port. °· You have a fever or chills. °· You have more redness, swelling, or pain around your port insertion site. °· You have more fluid or blood coming from your port insertion site. °· Your port insertion site feels warm to the touch. °· You have pus or a bad smell coming from the port insertion site. °Get help right away if: °· You have chest pain or shortness of breath. °· You have bleeding from your port that you cannot control. °Summary °· Take care of the port as told by your health care provider. °· Change your dressing as told by your health care provider. °· Keep all follow-up visits as told by your health care provider. °  This information is not intended to replace advice given to you by your health care provider. Make sure you discuss any questions you have with your health care provider. °Document Released: 11/30/2012 Document Revised: 01/01/2016 Document Reviewed: 01/01/2016 °Elsevier Interactive Patient Education © 2017 [employer] ° °

## 2016-12-08 LAB — IGG, IGA, IGM
IGA/IMMUNOGLOBULIN A, SERUM: 268 mg/dL (ref 61–437)
IgM, Qn, Serum: 56 mg/dL (ref 20–172)

## 2016-12-08 LAB — KAPPA/LAMBDA LIGHT CHAINS
IG KAPPA FREE LIGHT CHAIN: 46 mg/L — AB (ref 3.3–19.4)
IG LAMBDA FREE LIGHT CHAIN: 31.5 mg/L — AB (ref 5.7–26.3)
Kappa/Lambda FluidC Ratio: 1.46 (ref 0.26–1.65)

## 2016-12-08 LAB — TESTOSTERONE: Testosterone, Serum: >1500 ng/dL — ABNORMAL HIGH (ref 264–916)

## 2016-12-11 LAB — PROTEIN ELECTROPHORESIS, SERUM, WITH REFLEX
A/G RATIO SPE: 0.8 (ref 0.7–1.7)
ALBUMIN: 3.2 g/dL (ref 2.9–4.4)
Alpha 1: 0.2 g/dL (ref 0.0–0.4)
Alpha 2: 1 g/dL (ref 0.4–1.0)
Beta: 0.9 g/dL (ref 0.7–1.3)
GAMMA GLOBULIN: 1.9 g/dL — AB (ref 0.4–1.8)
GLOBULIN, TOTAL: 4 g/dL — AB (ref 2.2–3.9)
Interpretation(See Below): 0
M-Spike, %: 1.5 g/dL — ABNORMAL HIGH
TOTAL PROTEIN: 7.2 g/dL (ref 6.0–8.5)

## 2016-12-28 ENCOUNTER — Ambulatory Visit (HOSPITAL_BASED_OUTPATIENT_CLINIC_OR_DEPARTMENT_OTHER): Payer: Medicare Other

## 2016-12-28 ENCOUNTER — Other Ambulatory Visit (HOSPITAL_BASED_OUTPATIENT_CLINIC_OR_DEPARTMENT_OTHER): Payer: Medicare Other

## 2016-12-28 ENCOUNTER — Ambulatory Visit: Payer: Medicare Other

## 2016-12-28 VITALS — BP 124/70 | HR 66 | Temp 97.9°F | Resp 18

## 2016-12-28 DIAGNOSIS — C9000 Multiple myeloma not having achieved remission: Secondary | ICD-10-CM

## 2016-12-28 DIAGNOSIS — E349 Endocrine disorder, unspecified: Secondary | ICD-10-CM

## 2016-12-28 DIAGNOSIS — Z95828 Presence of other vascular implants and grafts: Secondary | ICD-10-CM

## 2016-12-28 DIAGNOSIS — D500 Iron deficiency anemia secondary to blood loss (chronic): Secondary | ICD-10-CM

## 2016-12-28 LAB — CBC WITH DIFFERENTIAL (CANCER CENTER ONLY)
BASO#: 0 10*3/uL (ref 0.0–0.2)
BASO%: 0.5 % (ref 0.0–2.0)
EOS ABS: 0.2 10*3/uL (ref 0.0–0.5)
EOS%: 3.1 % (ref 0.0–7.0)
HEMATOCRIT: 35 % — AB (ref 38.7–49.9)
HGB: 11.8 g/dL — ABNORMAL LOW (ref 13.0–17.1)
LYMPH#: 1.3 10*3/uL (ref 0.9–3.3)
LYMPH%: 22.8 % (ref 14.0–48.0)
MCH: 24.5 pg — ABNORMAL LOW (ref 28.0–33.4)
MCHC: 33.7 g/dL (ref 32.0–35.9)
MCV: 73 fL — AB (ref 82–98)
MONO#: 0.5 10*3/uL (ref 0.1–0.9)
MONO%: 8.5 % (ref 0.0–13.0)
NEUT#: 3.7 10*3/uL (ref 1.5–6.5)
NEUT%: 65.1 % (ref 40.0–80.0)
Platelets: 234 10*3/uL (ref 145–400)
RBC: 4.82 10*6/uL (ref 4.20–5.70)
RDW: 16.5 % — AB (ref 11.1–15.7)
WBC: 5.8 10*3/uL (ref 4.0–10.0)

## 2016-12-28 LAB — CMP (CANCER CENTER ONLY)
ALBUMIN: 3.8 g/dL (ref 3.3–5.5)
ALT(SGPT): 24 U/L (ref 10–47)
AST: 31 U/L (ref 11–38)
Alkaline Phosphatase: 73 U/L (ref 26–84)
BUN: 42 mg/dL — AB (ref 7–22)
CHLORIDE: 102 meq/L (ref 98–108)
CO2: 27 meq/L (ref 18–33)
Calcium: 9.1 mg/dL (ref 8.0–10.3)
Creat: 2.9 mg/dl — ABNORMAL HIGH (ref 0.6–1.2)
Glucose, Bld: 154 mg/dL — ABNORMAL HIGH (ref 73–118)
POTASSIUM: 4.3 meq/L (ref 3.3–4.7)
Sodium: 145 mEq/L (ref 128–145)
TOTAL PROTEIN: 8.5 g/dL — AB (ref 6.4–8.1)
Total Bilirubin: 0.5 mg/dl (ref 0.20–1.60)

## 2016-12-28 MED ORDER — HEPARIN SOD (PORK) LOCK FLUSH 100 UNIT/ML IV SOLN
500.0000 [IU] | Freq: Once | INTRAVENOUS | Status: AC
  Administered 2016-12-28: 500 [IU] via INTRAVENOUS
  Filled 2016-12-28: qty 5

## 2016-12-28 MED ORDER — TESTOSTERONE CYPIONATE 200 MG/ML IM SOLN
300.0000 mg | INTRAMUSCULAR | Status: DC
  Administered 2016-12-28: 300 mg via INTRAMUSCULAR

## 2016-12-28 MED ORDER — SODIUM CHLORIDE 0.9% FLUSH
10.0000 mL | INTRAVENOUS | Status: DC | PRN
  Administered 2016-12-28: 10 mL via INTRAVENOUS
  Filled 2016-12-28: qty 10

## 2016-12-28 MED ORDER — TESTOSTERONE CYPIONATE 200 MG/ML IM SOLN
INTRAMUSCULAR | Status: AC
  Filled 2016-12-28: qty 2

## 2016-12-28 NOTE — Progress Notes (Signed)
Patient complains of generalized body pain rating the pain a 10 on the 0 to 10 pain scale. Patient states he took his pain medication at 4 am. Dr. [name] notified.   Dr. [name] chairside.

## 2016-12-28 NOTE — Patient Instructions (Signed)
Implanted Port Home Guide An implanted port is a type of central line that is placed under the skin. Central lines are used to provide IV access when treatment or nutrition needs to be given through a person's veins. Implanted ports are used for long-term IV access. An implanted port may be placed because:  You need IV medicine that would be irritating to the small veins in your hands or arms.  You need long-term IV medicines, such as antibiotics.  You need IV nutrition for a long period.  You need frequent blood draws for lab tests.  You need dialysis.  Implanted ports are usually placed in the chest area, but they can also be placed in the upper arm, the abdomen, or the leg. An implanted port has two main parts:  Reservoir. The reservoir is round and will appear as a small, raised area under your skin. The reservoir is the part where a needle is inserted to give medicines or draw blood.  Catheter. The catheter is a thin, flexible tube that extends from the reservoir. The catheter is placed into a large vein. Medicine that is inserted into the reservoir goes into the catheter and then into the vein.  How will I care for my incision site? Do not get the incision site wet. Bathe or shower as directed by your health care provider. How is my port accessed? Special steps must be taken to access the port:  Before the port is accessed, a numbing cream can be placed on the skin. This helps numb the skin over the port site.  Your health care provider uses a sterile technique to access the port. ? Your health care provider must put on a mask and sterile gloves. ? The skin over your port is cleaned carefully with an antiseptic and allowed to dry. ? The port is gently pinched between sterile gloves, and a needle is inserted into the port.  Only "non-coring" port needles should be used to access the port. Once the port is accessed, a blood return should be checked. This helps ensure that the port  is in the vein and is not clogged.  If your port needs to remain accessed for a constant infusion, a clear (transparent) bandage will be placed over the needle site. The bandage and needle will need to be changed every week, or as directed by your health care provider.  Keep the bandage covering the needle clean and dry. Do not get it wet. Follow your health care provider's instructions on how to take a shower or bath while the port is accessed.  If your port does not need to stay accessed, no bandage is needed over the port.  What is flushing? Flushing helps keep the port from getting clogged. Follow your health care provider's instructions on how and when to flush the port. Ports are usually flushed with saline solution or a medicine called heparin. The need for flushing will depend on how the port is used.  If the port is used for intermittent medicines or blood draws, the port will need to be flushed: ? After medicines have been given. ? After blood has been drawn. ? As part of routine maintenance.  If a constant infusion is running, the port may not need to be flushed.  How long will my port stay implanted? The port can stay in for as long as your health care provider thinks it is needed. When it is time for the port to come out, surgery will be   done to remove it. The procedure is similar to the one performed when the port was put in. When should I seek immediate medical care? When you have an implanted port, you should seek immediate medical care if:  You notice a bad smell coming from the incision site.  You have swelling, redness, or drainage at the incision site.  You have more swelling or pain at the port site or the surrounding area.  You have a fever that is not controlled with medicine.  This information is not intended to replace advice given to you by your health care provider. Make sure you discuss any questions you have with your health care provider. Document  Released: 02/09/2005 Document Revised: 07/18/2015 Document Reviewed: 10/17/2012 Elsevier Interactive Patient Education  2017 [employer]  

## 2016-12-28 NOTE — Patient Instructions (Signed)
Testosterone injection What is this medicine? TESTOSTERONE (tes TOS ter one) is the main male hormone. It supports normal male development such as muscle growth, facial hair, and deep voice. It is used in males to treat low testosterone levels. This medicine may be used for other purposes; ask your health care provider or pharmacist if you have questions. COMMON BRAND NAME(S): Andro-L.A., Aveed, Delatestryl, Depo-Testosterone, Virilon What should I tell my health care provider before I take this medicine? They need to know if you have any of these conditions: -cancer -diabetes -heart disease -kidney disease -liver disease -lung disease -prostate disease -an unusual or allergic reaction to testosterone, other medicines, foods, dyes, or preservatives -pregnant or trying to get pregnant -breast-feeding How should I use this medicine? This medicine is for injection into a muscle. It is usually given by a health care professional in a hospital or clinic setting. Contact your pediatrician regarding the use of this medicine in children. While this medicine may be prescribed for children as young as 12 years of age for selected conditions, precautions do apply. Overdosage: If you think you have taken too much of this medicine contact a poison control center or emergency room at once. NOTE: This medicine is only for you. Do not share this medicine with others. What if I miss a dose? Try not to miss a dose. Your doctor or health care professional will tell you when your next injection is due. Notify the office if you are unable to keep an appointment. What may interact with this medicine? -medicines for diabetes -medicines that treat or prevent blood clots like warfarin -oxyphenbutazone -propranolol -steroid medicines like prednisone or cortisone This list may not describe all possible interactions. Give your health care provider a list of all the medicines, herbs, non-prescription drugs, or  dietary supplements you use. Also tell them if you smoke, drink alcohol, or use illegal drugs. Some items may interact with your medicine. What should I watch for while using this medicine? Visit your doctor or health care professional for regular checks on your progress. They will need to check the level of testosterone in your blood. This medicine is only approved for use in men who have low levels of testosterone related to certain medical conditions. Heart attacks and strokes have been reported with the use of this medicine. Notify your doctor or health care professional and seek emergency treatment if you develop breathing problems; changes in vision; confusion; chest pain or chest tightness; sudden arm pain; severe, sudden headache; trouble speaking or understanding; sudden numbness or weakness of the face, arm or leg; loss of balance or coordination. Talk to your doctor about the risks and benefits of this medicine. This medicine may affect blood sugar levels. If you have diabetes, check with your doctor or health care professional before you change your diet or the dose of your diabetic medicine. Testosterone injections are not commonly used in women. Women should inform their doctor if they wish to become pregnant or think they might be pregnant. There is a potential for serious side effects to an unborn child. Talk to your health care professional or pharmacist for more information. Talk with your doctor or health care professional about your birth control options while taking this medicine. This drug is banned from use in athletes by most athletic organizations. What side effects may I notice from receiving this medicine? Side effects that you should report to your doctor or health care professional as soon as possible: -allergic reactions like skin rash,   itching or hives, swelling of the face, lips, or tongue -breast enlargement -breathing problems -changes in emotions or moods -deep or  hoarse voice -irregular menstrual periods -signs and symptoms of liver injury like dark yellow or brown urine; general ill feeling or flu-like symptoms; light-colored stools; loss of appetite; nausea; right upper belly pain; unusually weak or tired; yellowing of the eyes or skin -stomach pain -swelling of the ankles, feet, hands -too frequent or persistent erections -trouble passing urine or change in the amount of urine Side effects that usually do not require medical attention (report to your doctor or health care professional if they continue or are bothersome): -acne -change in sex drive or performance -facial hair growth -hair loss -headache This list may not describe all possible side effects. Call your doctor for medical advice about side effects. You may report side effects to FDA at 1-800-FDA-1088. Where should I keep my medicine? Keep out of the reach of children. This medicine can be abused. Keep your medicine in a safe place to protect it from theft. Do not share this medicine with anyone. Selling or giving away this medicine is dangerous and against the law. Store at room temperature between 20 and 25 degrees C (68 and 77 degrees F). Do not freeze. Protect from light. Follow the directions for the product you are prescribed. Throw away any unused medicine after the expiration date. NOTE: This sheet is a summary. It may not cover all possible information. If you have questions about this medicine, talk to your doctor, pharmacist, or health care provider.  2018 Elsevier/Gold Standard (2015-03-16 07:33:55)  

## 2016-12-29 LAB — IGG, IGA, IGM
IgA, Qn, Serum: 306 mg/dL (ref 61–437)
IgM, Qn, Serum: 57 mg/dL (ref 20–172)

## 2016-12-29 LAB — RETICULOCYTES: Reticulocyte Count: 0.4 % — ABNORMAL LOW (ref 0.6–2.6)

## 2016-12-29 LAB — KAPPA/LAMBDA LIGHT CHAINS
IG KAPPA FREE LIGHT CHAIN: 50.5 mg/L — AB (ref 3.3–19.4)
Ig Lambda Free Light Chain: 32.2 mg/L — ABNORMAL HIGH (ref 5.7–26.3)
KAPPA/LAMBDA FLC RATIO: 1.57 (ref 0.26–1.65)

## 2016-12-29 LAB — TESTOSTERONE: TESTOSTERONE: 109 ng/dL — AB (ref 264–916)

## 2016-12-29 LAB — LACTATE DEHYDROGENASE: LDH: 211 U/L (ref 125–245)

## 2016-12-30 LAB — PROTEIN ELECTROPHORESIS, SERUM, WITH REFLEX
A/G RATIO SPE: 0.9 (ref 0.7–1.7)
ALPHA 2: 1.1 g/dL — AB (ref 0.4–1.0)
Albumin: 3.8 g/dL (ref 2.9–4.4)
Alpha 1: 0.2 g/dL (ref 0.0–0.4)
BETA: 1 g/dL (ref 0.7–1.3)
GLOBULIN, TOTAL: 4.3 g/dL — AB (ref 2.2–3.9)
Gamma Globulin: 2.1 g/dL — ABNORMAL HIGH (ref 0.4–1.8)
Interpretation(See Below): 0
M-Spike, %: 1.4 g/dL — ABNORMAL HIGH
TOTAL PROTEIN: 8.1 g/dL (ref 6.0–8.5)

## 2017-01-04 ENCOUNTER — Ambulatory Visit (HOSPITAL_BASED_OUTPATIENT_CLINIC_OR_DEPARTMENT_OTHER): Payer: Medicare Other

## 2017-01-04 ENCOUNTER — Encounter: Payer: Self-pay | Admitting: Hematology & Oncology

## 2017-01-04 ENCOUNTER — Other Ambulatory Visit: Payer: Self-pay

## 2017-01-04 ENCOUNTER — Ambulatory Visit: Payer: Medicare Other

## 2017-01-04 ENCOUNTER — Other Ambulatory Visit (HOSPITAL_BASED_OUTPATIENT_CLINIC_OR_DEPARTMENT_OTHER): Payer: Medicare Other

## 2017-01-04 ENCOUNTER — Ambulatory Visit (HOSPITAL_BASED_OUTPATIENT_CLINIC_OR_DEPARTMENT_OTHER): Payer: Medicare Other | Admitting: Hematology & Oncology

## 2017-01-04 VITALS — BP 131/64 | HR 83 | Temp 97.4°F | Resp 17 | Wt 191.0 lb

## 2017-01-04 DIAGNOSIS — E291 Testicular hypofunction: Secondary | ICD-10-CM

## 2017-01-04 DIAGNOSIS — C9000 Multiple myeloma not having achieved remission: Secondary | ICD-10-CM | POA: Diagnosis present

## 2017-01-04 DIAGNOSIS — D509 Iron deficiency anemia, unspecified: Secondary | ICD-10-CM | POA: Diagnosis not present

## 2017-01-04 DIAGNOSIS — N289 Disorder of kidney and ureter, unspecified: Secondary | ICD-10-CM

## 2017-01-04 DIAGNOSIS — E349 Endocrine disorder, unspecified: Secondary | ICD-10-CM

## 2017-01-04 DIAGNOSIS — E119 Type 2 diabetes mellitus without complications: Secondary | ICD-10-CM | POA: Diagnosis not present

## 2017-01-04 DIAGNOSIS — D500 Iron deficiency anemia secondary to blood loss (chronic): Secondary | ICD-10-CM

## 2017-01-04 DIAGNOSIS — Z299 Encounter for prophylactic measures, unspecified: Secondary | ICD-10-CM

## 2017-01-04 LAB — FERRITIN: Ferritin: 1186 ng/ml — ABNORMAL HIGH (ref 22–316)

## 2017-01-04 LAB — CMP (CANCER CENTER ONLY)
ALK PHOS: 77 U/L (ref 26–84)
ALT(SGPT): 18 U/L (ref 10–47)
AST: 20 U/L (ref 11–38)
Albumin: 3.7 g/dL (ref 3.3–5.5)
BILIRUBIN TOTAL: 0.5 mg/dL (ref 0.20–1.60)
BUN: 22 mg/dL (ref 7–22)
CALCIUM: 9.2 mg/dL (ref 8.0–10.3)
CO2: 28 mEq/L (ref 18–33)
Chloride: 102 mEq/L (ref 98–108)
Creat: 2.9 mg/dl — ABNORMAL HIGH (ref 0.6–1.2)
GLUCOSE: 134 mg/dL — AB (ref 73–118)
Potassium: 4 mEq/L (ref 3.3–4.7)
Sodium: 138 mEq/L (ref 128–145)
TOTAL PROTEIN: 8.3 g/dL — AB (ref 6.4–8.1)

## 2017-01-04 LAB — CBC WITH DIFFERENTIAL (CANCER CENTER ONLY)
BASO#: 0 10*3/uL (ref 0.0–0.2)
BASO%: 0.4 % (ref 0.0–2.0)
EOS%: 2.2 % (ref 0.0–7.0)
Eosinophils Absolute: 0.2 10*3/uL (ref 0.0–0.5)
HCT: 36.2 % — ABNORMAL LOW (ref 38.7–49.9)
HGB: 12 g/dL — ABNORMAL LOW (ref 13.0–17.1)
LYMPH#: 1.3 10*3/uL (ref 0.9–3.3)
LYMPH%: 16.9 % (ref 14.0–48.0)
MCH: 24.3 pg — ABNORMAL LOW (ref 28.0–33.4)
MCHC: 33.1 g/dL (ref 32.0–35.9)
MCV: 73 fL — ABNORMAL LOW (ref 82–98)
MONO#: 0.6 10*3/uL (ref 0.1–0.9)
MONO%: 8.1 % (ref 0.0–13.0)
NEUT#: 5.6 10*3/uL (ref 1.5–6.5)
NEUT%: 72.4 % (ref 40.0–80.0)
PLATELETS: 255 10*3/uL (ref 145–400)
RBC: 4.94 10*6/uL (ref 4.20–5.70)
RDW: 16.7 % — AB (ref 11.1–15.7)
WBC: 7.7 10*3/uL (ref 4.0–10.0)

## 2017-01-04 LAB — IRON AND TIBC
%SAT: 24 % (ref 20–55)
IRON: 53 ug/dL (ref 42–163)
TIBC: 225 ug/dL (ref 202–409)
UIBC: 172 ug/dL (ref 117–376)

## 2017-01-04 MED ORDER — SODIUM CHLORIDE 0.9 % IV SOLN
60.0000 mg | Freq: Once | INTRAVENOUS | Status: AC
  Administered 2017-01-04: 60 mg via INTRAVENOUS
  Filled 2017-01-04: qty 10

## 2017-01-04 NOTE — Patient Instructions (Signed)
Pamidronate injection What is this medicine? PAMIDRONATE (pa mi DROE nate) slows calcium loss from bones. It is used to treat high calcium blood levels from cancer or Paget's disease. It is also used to treat bone pain and prevent fractures from certain cancers that have spread to the bone. This medicine may be used for other purposes; ask your health care provider or pharmacist if you have questions. COMMON BRAND NAME(S): Aredia What should I tell my health care provider before I take this medicine? They need to know if you have any of these conditions: -aspirin-sensitive asthma -dental disease -kidney disease -an unusual or allergic reaction to pamidronate, other medicines, foods, dyes, or preservatives -pregnant or trying to get pregnant -breast-feeding How should I use this medicine? This medicine is for infusion into a vein. It is given by a health care professional in a hospital or clinic setting. Talk to your pediatrician regarding the use of this medicine in children. This medicine is not approved for use in children. Overdosage: If you think you have taken too much of this medicine contact a poison control center or emergency room at once. NOTE: This medicine is only for you. Do not share this medicine with others. What if I miss a dose? This does not apply. What may interact with this medicine? -certain antibiotics given by injection -medicines for inflammation or pain like ibuprofen, naproxen -some diuretics like bumetanide, furosemide -cyclosporine -parathyroid hormone -tacrolimus -teriparatide -thalidomide This list may not describe all possible interactions. Give your health care provider a list of all the medicines, herbs, non-prescription drugs, or dietary supplements you use. Also tell them if you smoke, drink alcohol, or use illegal drugs. Some items may interact with your medicine. What should I watch for while using this medicine? Visit your doctor or health care  professional for regular checkups. It may be some time before you see the benefit from this medicine. Do not stop taking your medicine unless your doctor tells you to. Your doctor may order blood tests or other tests to see how you are doing. Women should inform their doctor if they wish to become pregnant or think they might be pregnant. There is a potential for serious side effects to an unborn child. Talk to your health care professional or pharmacist for more information. You should make sure that you get enough calcium and vitamin D while you are taking this medicine. Discuss the foods you eat and the vitamins you take with your health care professional. Some people who take this medicine have severe bone, joint, and/or muscle pain. This medicine may also increase your risk for a broken thigh bone. Tell your doctor right away if you have pain in your upper leg or groin. Tell your doctor if you have any pain that does not go away or that gets worse. What side effects may I notice from receiving this medicine? Side effects that you should report to your doctor or health care professional as soon as possible: -allergic reactions like skin rash, itching or hives, swelling of the face, lips, or tongue -black or tarry stools -changes in vision -eye inflammation, pain -high blood pressure -jaw pain, especially burning or cramping -muscle weakness -numb, tingling pain -swelling of feet or hands -trouble passing urine or change in the amount of urine -unable to move easily Side effects that usually do not require medical attention (report to your doctor or health care professional if they continue or are bothersome): -bone, joint, or muscle pain -constipation -dizzy, drowsy -  fever -headache -loss of appetite -nausea, vomiting -pain at site where injected This list may not describe all possible side effects. Call your doctor for medical advice about side effects. You may report side effects to  FDA at 1-800-FDA-1088. Where should I keep my medicine? This drug is given in a hospital or clinic and will not be stored at home. NOTE: This sheet is a summary. It may not cover all possible information. If you have questions about this medicine, talk to your doctor, pharmacist, or health care provider.  2018 Elsevier/Gold Standard (2010-08-08 08:49:49)  

## 2017-01-04 NOTE — Progress Notes (Signed)
Hematology and Oncology Follow Up Visit  Rykker Coviello [id number] [birth year] 66 y.o. 01/04/2017   Principle Diagnosis:  1. IgG kappa myeloma. 2. Anemia secondary to renal insufficiency. 3. Intermittent iron-deficiency anemia. 4. Hypotestosteronemia  Current Therapy:   1. Aredia 60 mg IV q.2 months  2. Aranesp 300 mcg subcu as needed for hemoglobin less than 11. 3. Depo-Testosterone 300 mg q.4 weeks. 4. IV iron as indicated. 5. Kyprolis/Cytoxan - start on 01/11/2017     Interim History:  Mr.  Miera is back for followup.  Unfortunately, he just is not doing well.  He is still having this bony pain.  I think that the only way we are going to really know if this is secondary to myeloma is to treat him for myeloma.  I just do not see any other way around this.  I talked with him for about 40 minutes regarding this.  He is definitely on board with doing treatment.  He is tired of feeling poorly and tired of hurting.  We last saw him, his myeloma studies were relatively stable.  His M spike was 1.4 g/dL.  His IgG level was 2200 milligrams per deciliter.  His Kappa  light chain was 5.1 mg/dL.  O  He has renal insufficiency.  As such, I think we are going to have to utilize Kyprolis/Cytoxan.  I want to avoid Decadron because of his diabetes.  I told him about the protocol.  I explained how it works.  I went over the side effects.  I think he will tolerate this okay.  He does have low testosterone.  We have given him I M testosterone injections.  His testosterone last week was 109.  His blood sugars are typically on the higher side.  Overall, his performance status is ECOG 1.  Medications:  Current Outpatient Medications:  .  amLODipine (NORVASC) 10 MG tablet, Take 10 mg by mouth daily., Disp: , Rfl:  .  CARAFATE 1 GM/10ML suspension, Take 1 g by mouth 4 (four) times daily. , Disp: , Rfl:  .  dicyclomine (BENTYL) 10 MG capsule, Take 1 capsule (10 mg total) by mouth 3 (three) times  daily., Disp: 90 capsule, Rfl: 2 .  furosemide (LASIX) 40 MG tablet, Take 40 mg by mouth., Disp: , Rfl:  .  glipiZIDE (GLUCOTROL) 5 MG tablet, Take 5 mg by mouth 2 (two) times daily before a meal. , Disp: , Rfl:  .  isosorbide mononitrate (IMDUR) 30 MG 24 hr tablet, Take 30 mg by mouth daily., Disp: , Rfl:  .  latanoprost (XALATAN) 0.005 % ophthalmic solution, Place 1 drop into the right eye every evening. , Disp: , Rfl:  .  lidocaine-prilocaine (EMLA) cream, Apply 1 application topically as needed., Disp: 30 g, Rfl: 3 .  Linaclotide (LINZESS) 145 MCG CAPS capsule, Take 1 capsule (145 mcg total) by mouth daily before breakfast., Disp: 30 capsule, Rfl: 2 .  losartan (COZAAR) 25 MG tablet, Take 25 mg by mouth., Disp: , Rfl:  .  meclizine (ANTIVERT) 50 MG tablet, Take 0.5 tablets (25 mg total) by mouth 4 (four) times daily as needed for dizziness., Disp: 30 tablet, Rfl: 0 .  methocarbamol (ROBAXIN) 500 MG tablet, Take 500 mg by mouth 4 (four) times daily., Disp: , Rfl:  .  metoprolol succinate (TOPROL XL) 50 MG 24 hr tablet, Take 50 mg by mouth daily. , Disp: , Rfl:  .  mirabegron ER (MYRBETRIQ) 50 MG TB24 tablet, Take 50 mg by mouth daily.,  Disp: , Rfl:  .  Morphine-Naltrexone (EMBEDA) 20-0.8 MG CPCR, Take 1 capsule by mouth 3 (three) times daily., Disp: , Rfl:  .  Morphine-Naltrexone (EMBEDA) 30-1.2 MG CPCR, Take 3 (three) times daily by mouth., Disp: , Rfl:  .  omeprazole (PRILOSEC) 40 MG capsule, take 1 capsule by mouth twice a day, Disp: 180 capsule, Rfl: 0 .  ondansetron (ZOFRAN ODT) 4 MG disintegrating tablet, Take 1 tablet (4 mg total) by mouth every 8 (eight) hours as needed for nausea or vomiting., Disp: 10 tablet, Rfl: 0 .  orphenadrine (NORFLEX) 100 MG tablet, Take 1 tablet (100 mg total) by mouth 2 (two) times daily., Disp: 30 tablet, Rfl: 0 .  oxyCODONE (OXYCONTIN) 20 mg 12 hr tablet, Take 1 tablet (20 mg total) by mouth every 12 (twelve) hours., Disp: 14 tablet, Rfl: 0 .   oxyCODONE-acetaminophen (PERCOCET) 10-325 MG tablet, Take 1 tablet by mouth every 6 (six) hours as needed for pain., Disp: 30 tablet, Rfl: 0 .  oxyCODONE-acetaminophen (PERCOCET) 10-325 MG tablet, Take 1 tablet by mouth every 6 (six) hours as needed for pain., Disp: 60 tablet, Rfl: 0 .  Polyvinyl Alcohol-Povidone (CLEAR EYES ALL SEASONS) 5-6 MG/ML SOLN, Place 2 drops into both eyes 3 (three) times daily as needed (drye eyes.). , Disp: , Rfl:  .  potassium chloride SA (K-DUR,KLOR-CON) 20 MEQ tablet, Take 1 tablet (20 mEq total) by mouth 2 (two) times daily., Disp: 60 tablet, Rfl: 6 .  prochlorperazine (COMPAZINE) 10 MG tablet, Take 1 tablet (10 mg total) by mouth every 6 (six) hours as needed for nausea or vomiting., Disp: 60 tablet, Rfl: 2 .  Tafluprost (ZIOPTAN) 0.0015 % SOLN, Place 1 drop into both eyes daily. , Disp: , Rfl:  .  Vitamin D, Ergocalciferol, (DRISDOL) 50000 UNITS CAPS capsule, Take 50,000 Units by mouth every Saturday. , Disp: , Rfl:  No current facility-administered medications for this visit.   Facility-Administered Medications Ordered in Other Visits:  .  morphine 4 MG/ML injection 4 mg, 4 mg, Intravenous, Q4H PRN, Jalisha Enneking, Rudell Cobb, MD .  pamidronate (AREDIA) 60 mg in sodium chloride 0.9 % 500 mL IVPB, 60 mg, Intravenous, Once, [place], Holli Humbles, NP  Allergies:  Allergies  Allergen Reactions  . Contrast Media [Iodinated Diagnostic Agents]     Patient states he was instructed not to take IV contrast.  In 2008 he had an unknown reaction, and was told not to take it again.  He was also told not to take it due to his kidneys.  . Iodine Anxiety and Rash    Didn't feel right "instructed not to take per MD--something with his port"    Past Medical History, Surgical history, Social history, and Family History were reviewed and updated.  Review of Systems: As stated in the interim history  Physical Exam:  vitals were not taken for this visit.   Physical Exam    Constitutional: He is oriented to person, place, and time.  HENT:  Head: Normocephalic and atraumatic.  Mouth/Throat: Oropharynx is clear and moist.  Eyes: EOM are normal. Pupils are equal, round, and reactive to light.  Neck: Normal range of motion.  Cardiovascular: Normal rate, regular rhythm and normal heart sounds.  Pulmonary/Chest: Effort normal and breath sounds normal.  Abdominal: Soft. Bowel sounds are normal.  Musculoskeletal: Normal range of motion. He exhibits no edema, tenderness or deformity.  Lymphadenopathy:    He has no cervical adenopathy.  Neurological: He is alert and oriented to person, place,  and time.  Skin: Skin is warm and dry. No rash noted. No erythema.  Psychiatric: He has a normal mood and affect. His behavior is normal. Judgment and thought content normal.  Vitals reviewed.    Lab Results  Component Value Date   WBC 7.7 01/04/2017   HGB 12.0 (L) 01/04/2017   HCT 36.2 (L) 01/04/2017   MCV 73 (L) 01/04/2017   PLT 255 01/04/2017     Chemistry      Component Value Date/Time   NA 138 01/04/2017 0811   K 4.0 01/04/2017 0811   CL 102 01/04/2017 0811   CO2 28 01/04/2017 0811   BUN 22 01/04/2017 0811   CREATININE 2.9 (H) 01/04/2017 0811      Component Value Date/Time   CALCIUM 9.2 01/04/2017 0811   ALKPHOS 77 01/04/2017 0811   AST 20 01/04/2017 0811   ALT 18 01/04/2017 0811   BILITOT 0.50 01/04/2017 0811         Impression and Plan: Mr. Yerger is a 66 year old gentleman. He has multiple medical problems.   We will go ahead and get him started on the Carfilzomib/Cytoxan protocol.  I think this will work.  Hopefully it will help with his bony pain.  Again I spent about 40 minutes with him.  He is ready to try something so that he will feel better.  Hopefully, this will make him feel better.  We will have to watch him closely.  We will plan to get him back weekly so that we can see how his blood counts are doing.  He will get his Aredia  today.  11/12/20189:49 AM

## 2017-01-04 NOTE — Progress Notes (Signed)
START ON PATHWAY REGIMEN - Multiple Myeloma and Other Plasma Cell Dyscrasias     A cycle is every 28 days:     Dexamethasone      Cyclophosphamide      Carfilzomib      Carfilzomib      Carfilzomib   **Always confirm dose/schedule in your pharmacy ordering system**    Patient Characteristics: Relapsed / Refractory, All Lines of Therapy R-ISS Staging: Not Applicable Disease Classification: Relapsed Line of Therapy: Second Line Intent of Therapy: Non-Curative / Palliative Intent, Discussed with Patient

## 2017-01-05 LAB — KAPPA/LAMBDA LIGHT CHAINS
IG KAPPA FREE LIGHT CHAIN: 51.1 mg/L — AB (ref 3.3–19.4)
IG LAMBDA FREE LIGHT CHAIN: 27.2 mg/L — AB (ref 5.7–26.3)
KAPPA/LAMBDA FLC RATIO: 1.88 — AB (ref 0.26–1.65)

## 2017-01-05 LAB — IGG, IGA, IGM
IgA, Qn, Serum: 313 mg/dL (ref 61–437)
IgG, Qn, Serum: 2130 mg/dL — ABNORMAL HIGH (ref 700–1600)
IgM, Qn, Serum: 59 mg/dL (ref 20–172)

## 2017-01-05 LAB — TESTOSTERONE: Testosterone, Serum: >1500 ng/dL — ABNORMAL HIGH (ref 264–916)

## 2017-01-06 LAB — PROTEIN ELECTROPHORESIS, SERUM, WITH REFLEX
A/G RATIO SPE: 0.8 (ref 0.7–1.7)
Albumin: 3.7 g/dL (ref 2.9–4.4)
Alpha 1: 0.2 g/dL (ref 0.0–0.4)
Alpha 2: 1.3 g/dL — ABNORMAL HIGH (ref 0.4–1.0)
BETA: 1 g/dL (ref 0.7–1.3)
GLOBULIN, TOTAL: 4.5 g/dL — AB (ref 2.2–3.9)
Gamma Globulin: 2 g/dL — ABNORMAL HIGH (ref 0.4–1.8)
Interpretation(See Below): 0
M-Spike, %: 1.3 g/dL — ABNORMAL HIGH
TOTAL PROTEIN: 8.2 g/dL (ref 6.0–8.5)

## 2017-01-07 LAB — UIFE/LIGHT CHAINS/TP QN, 24-HR UR
FR KAPPA LT CH,24HR: 222 mg/24 hr
FR LAMBDA LT CH,24HR: 16 mg/24 hr
FREE LAMBDA LT CHAINS, UR: 15 mg/L — AB (ref 0.24–6.66)
Free Kappa Lt Chains,Ur: 211 mg/L — ABNORMAL HIGH (ref 1.35–24.19)
Kappa/Lambda Ratio,U: 14.07 — ABNORMAL HIGH (ref 2.04–10.37)
PROTEIN UR: 20.6 mg/dL
Prot,24hr calculated: 216 mg/24 hr — ABNORMAL HIGH (ref 30–150)

## 2017-01-11 ENCOUNTER — Ambulatory Visit: Payer: Medicare Other

## 2017-01-11 ENCOUNTER — Ambulatory Visit (HOSPITAL_BASED_OUTPATIENT_CLINIC_OR_DEPARTMENT_OTHER): Payer: Medicare Other

## 2017-01-11 ENCOUNTER — Other Ambulatory Visit (HOSPITAL_BASED_OUTPATIENT_CLINIC_OR_DEPARTMENT_OTHER): Payer: Medicare Other

## 2017-01-11 DIAGNOSIS — C9000 Multiple myeloma not having achieved remission: Secondary | ICD-10-CM | POA: Diagnosis not present

## 2017-01-11 DIAGNOSIS — Z5112 Encounter for antineoplastic immunotherapy: Secondary | ICD-10-CM

## 2017-01-11 LAB — CMP (CANCER CENTER ONLY)
ALT: 17 U/L (ref 10–47)
AST: 19 U/L (ref 11–38)
Albumin: 3.4 g/dL (ref 3.3–5.5)
Alkaline Phosphatase: 81 U/L (ref 26–84)
BUN: 24 mg/dL — AB (ref 7–22)
CHLORIDE: 104 meq/L (ref 98–108)
CO2: 30 meq/L (ref 18–33)
Calcium: 9 mg/dL (ref 8.0–10.3)
Creat: 2.9 mg/dl — ABNORMAL HIGH (ref 0.6–1.2)
GLUCOSE: 86 mg/dL (ref 73–118)
POTASSIUM: 4.3 meq/L (ref 3.3–4.7)
Sodium: 141 mEq/L (ref 128–145)
Total Bilirubin: 0.6 mg/dl (ref 0.20–1.60)
Total Protein: 8.1 g/dL (ref 6.4–8.1)

## 2017-01-11 LAB — CBC WITH DIFFERENTIAL (CANCER CENTER ONLY)
BASO#: 0 10*3/uL (ref 0.0–0.2)
BASO%: 0.6 % (ref 0.0–2.0)
EOS ABS: 0.2 10*3/uL (ref 0.0–0.5)
EOS%: 3.5 % (ref 0.0–7.0)
HEMATOCRIT: 33.1 % — AB (ref 38.7–49.9)
HGB: 11 g/dL — ABNORMAL LOW (ref 13.0–17.1)
LYMPH#: 1.6 10*3/uL (ref 0.9–3.3)
LYMPH%: 25.8 % (ref 14.0–48.0)
MCH: 24.4 pg — AB (ref 28.0–33.4)
MCHC: 33.2 g/dL (ref 32.0–35.9)
MCV: 73 fL — AB (ref 82–98)
MONO#: 0.7 10*3/uL (ref 0.1–0.9)
MONO%: 11.9 % (ref 0.0–13.0)
NEUT#: 3.6 10*3/uL (ref 1.5–6.5)
NEUT%: 58.2 % (ref 40.0–80.0)
PLATELETS: 234 10*3/uL (ref 145–400)
RBC: 4.51 10*6/uL (ref 4.20–5.70)
RDW: 17.1 % — AB (ref 11.1–15.7)
WBC: 6.2 10*3/uL (ref 4.0–10.0)

## 2017-01-11 MED ORDER — PROCHLORPERAZINE MALEATE 10 MG PO TABS: 10.0000 mg | ORAL_TABLET | Freq: Four times a day (QID) | ORAL | 1 refills | Status: DC | PRN

## 2017-01-11 MED ORDER — PROCHLORPERAZINE MALEATE 10 MG PO TABS
10.0000 mg | ORAL_TABLET | Freq: Once | ORAL | Status: AC
  Administered 2017-01-11: 10 mg via ORAL

## 2017-01-11 MED ORDER — SODIUM CHLORIDE 0.9 % IV SOLN
300.0000 mg/m2 | Freq: Once | INTRAVENOUS | Status: AC
  Administered 2017-01-11: 620 mg via INTRAVENOUS
  Filled 2017-01-11: qty 31

## 2017-01-11 MED ORDER — SODIUM CHLORIDE 0.9 % IV SOLN
Freq: Once | INTRAVENOUS | Status: AC
  Administered 2017-01-11: 11:00:00 via INTRAVENOUS

## 2017-01-11 MED ORDER — ONDANSETRON HCL 8 MG PO TABS: 8.0000 mg | ORAL_TABLET | Freq: Two times a day (BID) | ORAL | 1 refills | Status: DC | PRN

## 2017-01-11 MED ORDER — SODIUM CHLORIDE 0.9 % IV SOLN
Freq: Once | INTRAVENOUS | Status: AC
  Administered 2017-01-11: 10:00:00 via INTRAVENOUS

## 2017-01-11 MED ORDER — SODIUM CHLORIDE 0.9% FLUSH
10.0000 mL | INTRAVENOUS | Status: DC | PRN
  Administered 2017-01-11: 10 mL
  Filled 2017-01-11: qty 10

## 2017-01-11 MED ORDER — PROCHLORPERAZINE MALEATE 10 MG PO TABS
ORAL_TABLET | ORAL | Status: AC
  Filled 2017-01-11: qty 1

## 2017-01-11 MED ORDER — LORAZEPAM 0.5 MG PO TABS: 0.5000 mg | ORAL_TABLET | Freq: Four times a day (QID) | ORAL | 0 refills | Status: DC | PRN

## 2017-01-11 MED ORDER — HEPARIN SOD (PORK) LOCK FLUSH 100 UNIT/ML IV SOLN
500.0000 [IU] | Freq: Once | INTRAVENOUS | Status: AC | PRN
  Administered 2017-01-11: 500 [IU]
  Filled 2017-01-11: qty 5

## 2017-01-11 MED ORDER — DEXTROSE 5 % IV SOLN
20.0000 mg/m2 | Freq: Once | INTRAVENOUS | Status: AC
  Administered 2017-01-11: 42 mg via INTRAVENOUS
  Filled 2017-01-11: qty 21

## 2017-01-11 NOTE — Patient Instructions (Signed)
Implanted Port Home Guide An implanted port is a type of central line that is placed under the skin. Central lines are used to provide IV access when treatment or nutrition needs to be given through a person's veins. Implanted ports are used for long-term IV access. An implanted port may be placed because:  You need IV medicine that would be irritating to the small veins in your hands or arms.  You need long-term IV medicines, such as antibiotics.  You need IV nutrition for a long period.  You need frequent blood draws for lab tests.  You need dialysis.  Implanted ports are usually placed in the chest area, but they can also be placed in the upper arm, the abdomen, or the leg. An implanted port has two main parts:  Reservoir. The reservoir is round and will appear as a small, raised area under your skin. The reservoir is the part where a needle is inserted to give medicines or draw blood.  Catheter. The catheter is a thin, flexible tube that extends from the reservoir. The catheter is placed into a large vein. Medicine that is inserted into the reservoir goes into the catheter and then into the vein.  How will I care for my incision site? Do not get the incision site wet. Bathe or shower as directed by your health care provider. How is my port accessed? Special steps must be taken to access the port:  Before the port is accessed, a numbing cream can be placed on the skin. This helps numb the skin over the port site.  Your health care provider uses a sterile technique to access the port. ? Your health care provider must put on a mask and sterile gloves. ? The skin over your port is cleaned carefully with an antiseptic and allowed to dry. ? The port is gently pinched between sterile gloves, and a needle is inserted into the port.  Only "non-coring" port needles should be used to access the port. Once the port is accessed, a blood return should be checked. This helps ensure that the port  is in the vein and is not clogged.  If your port needs to remain accessed for a constant infusion, a clear (transparent) bandage will be placed over the needle site. The bandage and needle will need to be changed every week, or as directed by your health care provider.  Keep the bandage covering the needle clean and dry. Do not get it wet. Follow your health care provider's instructions on how to take a shower or bath while the port is accessed.  If your port does not need to stay accessed, no bandage is needed over the port.  What is flushing? Flushing helps keep the port from getting clogged. Follow your health care provider's instructions on how and when to flush the port. Ports are usually flushed with saline solution or a medicine called heparin. The need for flushing will depend on how the port is used.  If the port is used for intermittent medicines or blood draws, the port will need to be flushed: ? After medicines have been given. ? After blood has been drawn. ? As part of routine maintenance.  If a constant infusion is running, the port may not need to be flushed.  How long will my port stay implanted? The port can stay in for as long as your health care provider thinks it is needed. When it is time for the port to come out, surgery will be   done to remove it. The procedure is similar to the one performed when the port was put in. When should I seek immediate medical care? When you have an implanted port, you should seek immediate medical care if:  You notice a bad smell coming from the incision site.  You have swelling, redness, or drainage at the incision site.  You have more swelling or pain at the port site or the surrounding area.  You have a fever that is not controlled with medicine.  This information is not intended to replace advice given to you by your health care provider. Make sure you discuss any questions you have with your health care provider. Document  Released: 02/09/2005 Document Revised: 07/18/2015 Document Reviewed: 10/17/2012 Elsevier Interactive Patient Education  2017 [employer]  

## 2017-01-11 NOTE — Patient Instructions (Signed)
[facility] Discharge Instructions for Patients Receiving Chemotherapy  Today you received the following chemotherapy agents Cytoxan & Kyprolis  To help prevent nausea and vomiting after your treatment, we encourage you to take your nausea medication as prescribed by your MD.   If you develop nausea and vomiting that is not controlled by your nausea medication, call the clinic.   BELOW ARE SYMPTOMS THAT SHOULD BE REPORTED IMMEDIATELY:  *FEVER GREATER THAN 100.5 F  *CHILLS WITH OR WITHOUT FEVER  NAUSEA AND VOMITING THAT IS NOT CONTROLLED WITH YOUR NAUSEA MEDICATION  *UNUSUAL SHORTNESS OF BREATH  *UNUSUAL BRUISING OR BLEEDING  TENDERNESS IN MOUTH AND THROAT WITH OR WITHOUT PRESENCE OF ULCERS  *URINARY PROBLEMS  *BOWEL PROBLEMS  UNUSUAL RASH Items with * indicate a potential emergency and should be followed up as soon as possible.  Feel free to call the clinic should you have any questions or concerns. The clinic phone number is [phone].  Please show the [place] at check-in to the Emergency Department and triage nurse.    Cyclophosphamide injection What is this medicine? CYCLOPHOSPHAMIDE (sye kloe FOSS fa mide) is a chemotherapy drug. It slows the growth of cancer cells. This medicine is used to treat many types of cancer like lymphoma, myeloma, leukemia, breast cancer, and ovarian cancer, to name a few. This medicine may be used for other purposes; ask your health care provider or pharmacist if you have questions. COMMON BRAND NAME(S): Cytoxan, Neosar What should I tell my health care provider before I take this medicine? They need to know if you have any of these conditions: -blood disorders -history of other chemotherapy -infection -kidney disease -liver disease -recent or ongoing radiation therapy -tumors in the bone marrow -an unusual or allergic reaction to cyclophosphamide, other chemotherapy, other medicines, foods, dyes, or  preservatives -pregnant or trying to get pregnant -breast-feeding How should I use this medicine? This drug is usually given as an injection into a vein or muscle or by infusion into a vein. It is administered in a hospital or clinic by a specially trained health care professional. Talk to your pediatrician regarding the use of this medicine in children. Special care may be needed. Overdosage: If you think you have taken too much of this medicine contact a poison control center or emergency room at once. NOTE: This medicine is only for you. Do not share this medicine with others. What if I miss a dose? It is important not to miss your dose. Call your doctor or health care professional if you are unable to keep an appointment. What may interact with this medicine? This medicine may interact with the following medications: -amiodarone -amphotericin B -azathioprine -certain antiviral medicines for HIV or AIDS such as protease inhibitors (e.g., indinavir, ritonavir) and zidovudine -certain blood pressure medications such as benazepril, captopril, enalapril, fosinopril, lisinopril, moexipril, monopril, perindopril, quinapril, ramipril, trandolapril -certain cancer medications such as anthracyclines (e.g., daunorubicin, doxorubicin), busulfan, cytarabine, paclitaxel, pentostatin, tamoxifen, trastuzumab -certain diuretics such as chlorothiazide, chlorthalidone, hydrochlorothiazide, indapamide, metolazone -certain medicines that treat or prevent blood clots like warfarin -certain muscle relaxants such as succinylcholine -cyclosporine -etanercept -indomethacin -medicines to increase blood counts like filgrastim, pegfilgrastim, sargramostim -medicines used as general anesthesia -metronidazole -natalizumab This list may not describe all possible interactions. Give your health care provider a list of all the medicines, herbs, non-prescription drugs, or dietary supplements you use. Also tell them if  you smoke, drink alcohol, or use illegal drugs. Some items may interact with your  medicine. What should I watch for while using this medicine? Visit your doctor for checks on your progress. This drug may make you feel generally unwell. This is not uncommon, as chemotherapy can affect healthy cells as well as cancer cells. Report any side effects. Continue your course of treatment even though you feel ill unless your doctor tells you to stop. Drink water or other fluids as directed. Urinate often, even at night. In some cases, you may be given additional medicines to help with side effects. Follow all directions for their use. Call your doctor or health care professional for advice if you get a fever, chills or sore throat, or other symptoms of a cold or flu. Do not treat yourself. This drug decreases your body's ability to fight infections. Try to avoid being around people who are sick. This medicine may increase your risk to bruise or bleed. Call your doctor or health care professional if you notice any unusual bleeding. Be careful brushing and flossing your teeth or using a toothpick because you may get an infection or bleed more easily. If you have any dental work done, tell your dentist you are receiving this medicine. You may get drowsy or dizzy. Do not drive, use machinery, or do anything that needs mental alertness until you know how this medicine affects you. Do not become pregnant while taking this medicine or for 1 year after stopping it. Women should inform their doctor if they wish to become pregnant or think they might be pregnant. Men should not father a child while taking this medicine and for 4 months after stopping it. There is a potential for serious side effects to an unborn child. Talk to your health care professional or pharmacist for more information. Do not breast-feed an infant while taking this medicine. This medicine may interfere with the ability to have a child. This medicine  has caused ovarian failure in some women. This medicine has caused reduced sperm counts in some men. You should talk with your doctor or health care professional if you are concerned about your fertility. If you are going to have surgery, tell your doctor or health care professional that you have taken this medicine. What side effects may I notice from receiving this medicine? Side effects that you should report to your doctor or health care professional as soon as possible: -allergic reactions like skin rash, itching or hives, swelling of the face, lips, or tongue -low blood counts - this medicine may decrease the number of white blood cells, red blood cells and platelets. You may be at increased risk for infections and bleeding. -signs of infection - fever or chills, cough, sore throat, pain or difficulty passing urine -signs of decreased platelets or bleeding - bruising, pinpoint red spots on the skin, black, tarry stools, blood in the urine -signs of decreased red blood cells - unusually weak or tired, fainting spells, lightheadedness -breathing problems -dark urine -dizziness -palpitations -swelling of the ankles, feet, hands -trouble passing urine or change in the amount of urine -weight gain -yellowing of the eyes or skin Side effects that usually do not require medical attention (report to your doctor or health care professional if they continue or are bothersome): -changes in nail or skin color -hair loss -missed menstrual periods -mouth sores -nausea, vomiting This list may not describe all possible side effects. Call your doctor for medical advice about side effects. You may report side effects to FDA at 1-800-FDA-1088. Where should I keep my medicine? This drug  is given in a hospital or clinic and will not be stored at home. NOTE: This sheet is a summary. It may not cover all possible information. If you have questions about this medicine, talk to your doctor, pharmacist, or  health care provider.  2018 Elsevier/Gold Standard (2011-12-25 16:22:58)   Carfilzomib injection What is this medicine? CARFILZOMIB (kar FILZ oh mib) targets a specific protein within cancer cells and stops the cancer cells from growing. It is used to treat multiple myeloma. This medicine may be used for other purposes; ask your health care provider or pharmacist if you have questions. COMMON BRAND NAME(S): KYPROLIS What should I tell my health care provider before I take this medicine? They need to know if you have any of these conditions: -heart disease -history of blood clots -irregular heartbeat -kidney disease -liver disease -lung or breathing disease -an unusual or allergic reaction to carfilzomib, or other medicines, foods, dyes, or preservatives -pregnant or trying to get pregnant -breast-feeding How should I use this medicine? This medicine is for injection or infusion into a vein. It is given by a health care professional in a hospital or clinic setting. Talk to your pediatrician regarding the use of this medicine in children. Special care may be needed. Overdosage: If you think you have taken too much of this medicine contact a poison control center or emergency room at once. NOTE: This medicine is only for you. Do not share this medicine with others. What if I miss a dose? It is important not to miss your dose. Call your doctor or health care professional if you are unable to keep an appointment. What may interact with this medicine? Interactions are not expected. Give your health care provider a list of all the medicines, herbs, non-prescription drugs, or dietary supplements you use. Also tell them if you smoke, drink alcohol, or use illegal drugs. Some items may interact with your medicine. This list may not describe all possible interactions. Give your health care provider a list of all the medicines, herbs, non-prescription drugs, or dietary supplements you use. Also  tell them if you smoke, drink alcohol, or use illegal drugs. Some items may interact with your medicine. What should I watch for while using this medicine? Your condition will be monitored carefully while you are receiving this medicine. Report any side effects. Continue your course of treatment even though you feel ill unless your doctor tells you to stop. You may need blood work done while you are taking this medicine. Do not become pregnant while taking this medicine or for at least 30 days after stopping it. Women should inform their doctor if they wish to become pregnant or think they might be pregnant. There is a potential for serious side effects to an unborn child. Men should not father a child while taking this medicine and for 90 days after stopping it. Talk to your health care professional or pharmacist for more information. Do not breast-feed an infant while taking this medicine. Check with your doctor or health care professional if you get an attack of severe diarrhea, nausea and vomiting, or if you sweat a lot. The loss of too much body fluid can make it dangerous for you to take this medicine. You may get dizzy. Do not drive, use machinery, or do anything that needs mental alertness until you know how this medicine affects you. Do not stand or sit up quickly, especially if you are an older patient. This reduces the risk of dizzy or fainting  spells. What side effects may I notice from receiving this medicine? Side effects that you should report to your doctor or health care professional as soon as possible: -allergic reactions like skin rash, itching or hives, swelling of the face, lips, or tongue -confusion -dizziness -feeling faint or lightheaded -fever or chills -palpitations -seizures -signs and symptoms of bleeding such as bloody or black, tarry stools; red or dark-brown urine; spitting up blood or brown material that looks like coffee grounds; red spots on the skin; unusual  bruising or bleeding including from the eye, gums, or nose -signs and symptoms of a blood clot such as breathing problems; changes in vision; chest pain; severe, sudden headache; pain, swelling, warmth in the leg; trouble speaking; sudden numbness or weakness of the face, arm or leg -signs and symptoms of kidney injury like trouble passing urine or change in the amount of urine -signs and symptoms of liver injury like dark yellow or brown urine; general ill feeling or flu-like symptoms; light-colored stools; loss of appetite; nausea; right upper belly pain; unusually weak or tired; yellowing of the eyes or skin Side effects that usually do not require medical attention (report to your doctor or health care professional if they continue or are bothersome): -back pain -cough -diarrhea -headache -muscle cramps -vomiting This list may not describe all possible side effects. Call your doctor for medical advice about side effects. You may report side effects to FDA at 1-800-FDA-1088. Where should I keep my medicine? This drug is given in a hospital or clinic and will not be stored at home. NOTE: This sheet is a summary. It may not cover all possible information. If you have questions about this medicine, talk to your doctor, pharmacist, or health care provider.  2018 Elsevier/Gold Standard (2015-03-14 13:39:23)

## 2017-01-11 NOTE — Progress Notes (Signed)
Reviewed pt labs (CBC & CMET) with Dr. [name]. Pt ok to treat with creatinine of 2.9

## 2017-01-12 ENCOUNTER — Telehealth: Payer: Self-pay

## 2017-01-12 NOTE — Telephone Encounter (Signed)
Spoke with pt via phone re: 1st time Kyprolis/Cytoxan yesterday. Pt was napping & awakened by phone call. Reports feeling tired and "queasy," but has taken anti-emetics as prescribed. Denies vomiting and is able to push fluids and eat as usual. Pt denies changes to appetite, bowel function, or respiratory status. Aware of how to reach the office and/or on call MD as needed. dph

## 2017-01-15 ENCOUNTER — Other Ambulatory Visit: Payer: Self-pay | Admitting: *Deleted

## 2017-01-15 DIAGNOSIS — C9000 Multiple myeloma not having achieved remission: Secondary | ICD-10-CM

## 2017-01-18 ENCOUNTER — Ambulatory Visit (HOSPITAL_BASED_OUTPATIENT_CLINIC_OR_DEPARTMENT_OTHER): Payer: Medicare Other | Admitting: Family

## 2017-01-18 ENCOUNTER — Other Ambulatory Visit (HOSPITAL_BASED_OUTPATIENT_CLINIC_OR_DEPARTMENT_OTHER): Payer: Medicare Other

## 2017-01-18 ENCOUNTER — Other Ambulatory Visit: Payer: Self-pay

## 2017-01-18 ENCOUNTER — Encounter: Payer: Self-pay | Admitting: Family

## 2017-01-18 ENCOUNTER — Ambulatory Visit (HOSPITAL_BASED_OUTPATIENT_CLINIC_OR_DEPARTMENT_OTHER): Payer: Medicare Other

## 2017-01-18 ENCOUNTER — Ambulatory Visit: Payer: Medicare Other

## 2017-01-18 VITALS — BP 133/63 | HR 67 | Temp 98.2°F | Resp 18 | Wt 195.5 lb

## 2017-01-18 DIAGNOSIS — R5383 Other fatigue: Secondary | ICD-10-CM

## 2017-01-18 DIAGNOSIS — D631 Anemia in chronic kidney disease: Secondary | ICD-10-CM

## 2017-01-18 DIAGNOSIS — D500 Iron deficiency anemia secondary to blood loss (chronic): Secondary | ICD-10-CM

## 2017-01-18 DIAGNOSIS — C9000 Multiple myeloma not having achieved remission: Secondary | ICD-10-CM

## 2017-01-18 DIAGNOSIS — D509 Iron deficiency anemia, unspecified: Secondary | ICD-10-CM | POA: Diagnosis not present

## 2017-01-18 DIAGNOSIS — Z5112 Encounter for antineoplastic immunotherapy: Secondary | ICD-10-CM

## 2017-01-18 DIAGNOSIS — E349 Endocrine disorder, unspecified: Secondary | ICD-10-CM

## 2017-01-18 DIAGNOSIS — N189 Chronic kidney disease, unspecified: Secondary | ICD-10-CM | POA: Diagnosis not present

## 2017-01-18 DIAGNOSIS — R52 Pain, unspecified: Secondary | ICD-10-CM | POA: Diagnosis not present

## 2017-01-18 LAB — CMP (CANCER CENTER ONLY)
ALBUMIN: 3.5 g/dL (ref 3.3–5.5)
ALT: 16 U/L (ref 10–47)
AST: 23 U/L (ref 11–38)
Alkaline Phosphatase: 85 U/L — ABNORMAL HIGH (ref 26–84)
BILIRUBIN TOTAL: 0.6 mg/dL (ref 0.20–1.60)
BUN, Bld: 22 mg/dL (ref 7–22)
CO2: 26 mEq/L (ref 18–33)
Calcium: 8.6 mg/dL (ref 8.0–10.3)
Chloride: 105 mEq/L (ref 98–108)
Creat: 2.3 mg/dl — ABNORMAL HIGH (ref 0.6–1.2)
Glucose, Bld: 81 mg/dL (ref 73–118)
POTASSIUM: 4.2 meq/L (ref 3.3–4.7)
Sodium: 141 mEq/L (ref 128–145)
TOTAL PROTEIN: 8 g/dL (ref 6.4–8.1)

## 2017-01-18 LAB — CBC WITH DIFFERENTIAL (CANCER CENTER ONLY)
BASO#: 0 10*3/uL (ref 0.0–0.2)
BASO%: 0.5 % (ref 0.0–2.0)
EOS%: 4.1 % (ref 0.0–7.0)
Eosinophils Absolute: 0.3 10*3/uL (ref 0.0–0.5)
HCT: 31.9 % — ABNORMAL LOW (ref 38.7–49.9)
HEMOGLOBIN: 10.6 g/dL — AB (ref 13.0–17.1)
LYMPH#: 1.6 10*3/uL (ref 0.9–3.3)
LYMPH%: 24.7 % (ref 14.0–48.0)
MCH: 24.3 pg — AB (ref 28.0–33.4)
MCHC: 33.2 g/dL (ref 32.0–35.9)
MCV: 73 fL — ABNORMAL LOW (ref 82–98)
MONO#: 0.7 10*3/uL (ref 0.1–0.9)
MONO%: 11.7 % (ref 0.0–13.0)
NEUT%: 59 % (ref 40.0–80.0)
NEUTROS ABS: 3.8 10*3/uL (ref 1.5–6.5)
PLATELETS: 201 10*3/uL (ref 145–400)
RBC: 4.37 10*6/uL (ref 4.20–5.70)
RDW: 16.9 % — ABNORMAL HIGH (ref 11.1–15.7)
WBC: 6.4 10*3/uL (ref 4.0–10.0)

## 2017-01-18 MED ORDER — DEXTROSE 5 % IV SOLN
20.0000 mg/m2 | Freq: Once | INTRAVENOUS | Status: AC
  Administered 2017-01-18: 42 mg via INTRAVENOUS
  Filled 2017-01-18: qty 21

## 2017-01-18 MED ORDER — SODIUM CHLORIDE 0.9 % IV SOLN
Freq: Once | INTRAVENOUS | Status: AC
  Administered 2017-01-18: 13:00:00 via INTRAVENOUS

## 2017-01-18 MED ORDER — SODIUM CHLORIDE 0.9% FLUSH
10.0000 mL | Freq: Once | INTRAVENOUS | Status: AC
  Administered 2017-01-18: 10 mL
  Filled 2017-01-18: qty 10

## 2017-01-18 MED ORDER — DEXAMETHASONE SODIUM PHOSPHATE 10 MG/ML IJ SOLN
INTRAMUSCULAR | Status: AC
  Filled 2017-01-18: qty 1

## 2017-01-18 MED ORDER — PROCHLORPERAZINE MALEATE 10 MG PO TABS
ORAL_TABLET | ORAL | Status: AC
  Filled 2017-01-18: qty 1

## 2017-01-18 MED ORDER — PALONOSETRON HCL INJECTION 0.25 MG/5ML
INTRAVENOUS | Status: AC
  Filled 2017-01-18: qty 5

## 2017-01-18 MED ORDER — DEXAMETHASONE SODIUM PHOSPHATE 10 MG/ML IJ SOLN
10.0000 mg | Freq: Once | INTRAMUSCULAR | Status: AC
  Administered 2017-01-18: 10 mg via INTRAVENOUS

## 2017-01-18 MED ORDER — HEPARIN SOD (PORK) LOCK FLUSH 100 UNIT/ML IV SOLN
500.0000 [IU] | Freq: Once | INTRAVENOUS | Status: AC | PRN
  Administered 2017-01-18: 500 [IU]
  Filled 2017-01-18: qty 5

## 2017-01-18 MED ORDER — SODIUM CHLORIDE 0.9 % IV SOLN
300.0000 mg/m2 | Freq: Once | INTRAVENOUS | Status: AC
  Administered 2017-01-18: 620 mg via INTRAVENOUS
  Filled 2017-01-18: qty 31

## 2017-01-18 MED ORDER — PROCHLORPERAZINE MALEATE 10 MG PO TABS
10.0000 mg | ORAL_TABLET | Freq: Once | ORAL | Status: AC
  Administered 2017-01-18: 10 mg via ORAL

## 2017-01-18 MED ORDER — PALONOSETRON HCL INJECTION 0.25 MG/5ML
0.2500 mg | Freq: Once | INTRAVENOUS | Status: AC
  Administered 2017-01-18: 0.25 mg via INTRAVENOUS

## 2017-01-18 MED ORDER — SODIUM CHLORIDE 0.9% FLUSH
10.0000 mL | INTRAVENOUS | Status: DC | PRN
  Administered 2017-01-18: 10 mL
  Filled 2017-01-18: qty 10

## 2017-01-18 NOTE — Patient Instructions (Signed)
Implanted Port Home Guide An implanted port is a type of central line that is placed under the skin. Central lines are used to provide IV access when treatment or nutrition needs to be given through a person's veins. Implanted ports are used for long-term IV access. An implanted port may be placed because:  You need IV medicine that would be irritating to the small veins in your hands or arms.  You need long-term IV medicines, such as antibiotics.  You need IV nutrition for a long period.  You need frequent blood draws for lab tests.  You need dialysis.  Implanted ports are usually placed in the chest area, but they can also be placed in the upper arm, the abdomen, or the leg. An implanted port has two main parts:  Reservoir. The reservoir is round and will appear as a small, raised area under your skin. The reservoir is the part where a needle is inserted to give medicines or draw blood.  Catheter. The catheter is a thin, flexible tube that extends from the reservoir. The catheter is placed into a large vein. Medicine that is inserted into the reservoir goes into the catheter and then into the vein.  How will I care for my incision site? Do not get the incision site wet. Bathe or shower as directed by your health care provider. How is my port accessed? Special steps must be taken to access the port:  Before the port is accessed, a numbing cream can be placed on the skin. This helps numb the skin over the port site.  Your health care provider uses a sterile technique to access the port. ? Your health care provider must put on a mask and sterile gloves. ? The skin over your port is cleaned carefully with an antiseptic and allowed to dry. ? The port is gently pinched between sterile gloves, and a needle is inserted into the port.  Only "non-coring" port needles should be used to access the port. Once the port is accessed, a blood return should be checked. This helps ensure that the port  is in the vein and is not clogged.  If your port needs to remain accessed for a constant infusion, a clear (transparent) bandage will be placed over the needle site. The bandage and needle will need to be changed every week, or as directed by your health care provider.  Keep the bandage covering the needle clean and dry. Do not get it wet. Follow your health care provider's instructions on how to take a shower or bath while the port is accessed.  If your port does not need to stay accessed, no bandage is needed over the port.  What is flushing? Flushing helps keep the port from getting clogged. Follow your health care provider's instructions on how and when to flush the port. Ports are usually flushed with saline solution or a medicine called heparin. The need for flushing will depend on how the port is used.  If the port is used for intermittent medicines or blood draws, the port will need to be flushed: ? After medicines have been given. ? After blood has been drawn. ? As part of routine maintenance.  If a constant infusion is running, the port may not need to be flushed.  How long will my port stay implanted? The port can stay in for as long as your health care provider thinks it is needed. When it is time for the port to come out, surgery will be   done to remove it. The procedure is similar to the one performed when the port was put in. When should I seek immediate medical care? When you have an implanted port, you should seek immediate medical care if:  You notice a bad smell coming from the incision site.  You have swelling, redness, or drainage at the incision site.  You have more swelling or pain at the port site or the surrounding area.  You have a fever that is not controlled with medicine.  This information is not intended to replace advice given to you by your health care provider. Make sure you discuss any questions you have with your health care provider. Document  Released: 02/09/2005 Document Revised: 07/18/2015 Document Reviewed: 10/17/2012 Elsevier Interactive Patient Education  2017 [employer]  

## 2017-01-18 NOTE — Progress Notes (Signed)
Ok to treat with creatinine 2.3 per Scherrie Bateman, NP

## 2017-01-18 NOTE — Progress Notes (Signed)
Hematology and Oncology Follow Up Visit  [name] [id number] 03/23/1950 66 y.o. 01/18/2017   Principle Diagnosis:  IgG kappa myeloma Anemia secondary to renal insufficiency Intermittent iron-deficiency anemia Hypotestosteronemia  Current Therapy:   Aredia 60 mg IV q.2 months  Aranesp 300 mcg subcu as needed for hemoglobin less than 11 Depo-Testosterone 300 mg q.4 weeks IV iron as indicated Kyprolis/Cytoxan - started on 01/11/2017   Interim History:  [name] is here today for follow-up and treatment. He is doing well but does have some fatigue at times. He has generalized aches and pains but feels that this may be slightly better. He feels that his pain medication regimen is effective in managing his pain.  2 weeks ago M-spike was 1.3, kappa light chain 5.11 mg/dL and IgG level was 2,130 mg/dL.  His last testosterone level was > 1500.  No fever, chills, n/v, cough, rash, dizziness, SOB, chest pain, palpitations, abdominal pain or changes in bowel or bladder habits.  No swelling, tenderness, numbness or tingling in his extremities.  No episodes of bleeding, bruising or petechiae. No lymphadenopathy found on exam.  He has a good appetite and I staying well hydrated. Her weight is up 4 lbs since his last visit. He enjoyed Thanksgiving with his family.    ECOG Performance Status: 1 - Symptomatic but completely ambulatory  Medications:  Allergies as of 01/18/2017      Reactions   Contrast Media [iodinated Diagnostic Agents]    Patient states he was instructed not to take IV contrast.  In 2008 he had an unknown reaction, and was told not to take it again.  He was also told not to take it due to his kidneys.   Iodine Anxiety, Rash   Didn't feel right "instructed not to take per MD--something with his port"      Medication List        Accurate as of 01/18/17 12:16 PM. Always use your most recent med list.          amLODipine 10 MG tablet Commonly known as:   NORVASC Take 10 mg by mouth daily.   CARAFATE 1 GM/10ML suspension Generic drug:  sucralfate Take 1 g by mouth 4 (four) times daily.   CLEAR EYES ALL SEASONS 5-6 MG/ML Soln Generic drug:  Polyvinyl Alcohol-Povidone Place 2 drops into both eyes 3 (three) times daily as needed (drye eyes.).   dicyclomine 10 MG capsule Commonly known as:  BENTYL Take 1 capsule (10 mg total) by mouth 3 (three) times daily.   EMBEDA 20-0.8 MG Cpcr Generic drug:  Morphine-Naltrexone Take 1 capsule by mouth 3 (three) times daily.   EMBEDA 30-1.2 MG Cpcr Generic drug:  Morphine-Naltrexone Take 3 (three) times daily by mouth.   furosemide 40 MG tablet Commonly known as:  LASIX Take 40 mg by mouth.   glipiZIDE 5 MG tablet Commonly known as:  GLUCOTROL Take 5 mg by mouth 2 (two) times daily before a meal.   isosorbide mononitrate 30 MG 24 hr tablet Commonly known as:  IMDUR Take 30 mg by mouth daily.   latanoprost 0.005 % ophthalmic solution Commonly known as:  XALATAN Place 1 drop into the right eye every evening.   lidocaine-prilocaine cream Commonly known as:  EMLA Apply 1 application topically as needed.   linaclotide 145 MCG Caps capsule Commonly known as:  LINZESS Take 1 capsule (145 mcg total) by mouth daily before breakfast.   LORazepam 0.5 MG tablet Commonly known as:  ATIVAN Take 1 tablet (  0.5 mg total) every 6 (six) hours as needed by mouth (Nausea or vomiting).   losartan 25 MG tablet Commonly known as:  COZAAR Take 25 mg by mouth.   meclizine 50 MG tablet Commonly known as:  ANTIVERT Take 0.5 tablets (25 mg total) by mouth 4 (four) times daily as needed for dizziness.   methocarbamol 500 MG tablet Commonly known as:  ROBAXIN Take 500 mg by mouth 4 (four) times daily.   mirabegron ER 50 MG Tb24 tablet Commonly known as:  MYRBETRIQ Take 50 mg by mouth daily.   omeprazole 40 MG capsule Commonly known as:  PRILOSEC take 1 capsule by mouth twice a day   ondansetron  4 MG disintegrating tablet Commonly known as:  ZOFRAN ODT Take 1 tablet (4 mg total) by mouth every 8 (eight) hours as needed for nausea or vomiting.   ondansetron 8 MG tablet Commonly known as:  ZOFRAN Take 1 tablet (8 mg total) 2 (two) times daily as needed by mouth (Nausea or vomiting).   orphenadrine 100 MG tablet Commonly known as:  NORFLEX Take 1 tablet (100 mg total) by mouth 2 (two) times daily.   oxyCODONE 20 mg 12 hr tablet Commonly known as:  OXYCONTIN Take 1 tablet (20 mg total) by mouth every 12 (twelve) hours.   oxyCODONE-acetaminophen 10-325 MG tablet Commonly known as:  PERCOCET Take 1 tablet by mouth every 6 (six) hours as needed for pain.   oxyCODONE-acetaminophen 10-325 MG tablet Commonly known as:  PERCOCET Take 1 tablet by mouth every 6 (six) hours as needed for pain.   potassium chloride SA 20 MEQ tablet Commonly known as:  K-DUR,KLOR-CON Take 1 tablet (20 mEq total) by mouth 2 (two) times daily.   prochlorperazine 10 MG tablet Commonly known as:  COMPAZINE Take 1 tablet (10 mg total) by mouth every 6 (six) hours as needed for nausea or vomiting.   prochlorperazine 10 MG tablet Commonly known as:  COMPAZINE Take 1 tablet (10 mg total) every 6 (six) hours as needed by mouth (Nausea or vomiting).   TOPROL XL 50 MG 24 hr tablet Generic drug:  metoprolol succinate Take 50 mg by mouth daily.   Vitamin D (Ergocalciferol) 50000 units Caps capsule Commonly known as:  DRISDOL Take 50,000 Units by mouth every Saturday.   ZIOPTAN 0.0015 % Soln Generic drug:  Tafluprost Place 1 drop into both eyes daily.       Allergies:  Allergies  Allergen Reactions  . Contrast Media [Iodinated Diagnostic Agents]     Patient states he was instructed not to take IV contrast.  In 2008 he had an unknown reaction, and was told not to take it again.  He was also told not to take it due to his kidneys.  . Iodine Anxiety and Rash    Didn't feel right "instructed not to  take per MD--something with his port"    Past Medical History, Surgical history, Social history, and Family History were reviewed and updated.  Review of Systems: All other 10 point review of systems is negative.   Physical Exam:  weight is 195 lb 8 oz (88.7 kg). His oral temperature is 98.2 F (36.8 C). His blood pressure is 133/63 and his pulse is 67. His respiration is 18 and oxygen saturation is 100%.   Wt Readings from Last 3 Encounters:  01/18/17 195 lb 8 oz (88.7 kg)  01/04/17 191 lb (86.6 kg)  12/07/16 198 lb (89.8 kg)    Ocular: Sclerae unicteric, pupils  equal, round and reactive to light Ear-nose-throat: Oropharynx clear, dentition fair Lymphatic: No cervical, supraclavicular or axillary adenopathy Lungs no rales or rhonchi, good excursion bilaterally Heart regular rate and rhythm, no murmur appreciated Abd soft, nontender, positive bowel sounds, no liver or spleen tip palpated on exam, no fluid wave MSK no focal spinal tenderness, no joint edema Neuro: non-focal, well-oriented, appropriate affect Breasts: Deferred   Lab Results  Component Value Date   WBC 6.4 01/18/2017   HGB 10.6 (L) 01/18/2017   HCT 31.9 (L) 01/18/2017   MCV 73 (L) 01/18/2017   PLT 201 01/18/2017   Lab Results  Component Value Date   FERRITIN 1,186 (H) 01/04/2017   IRON 53 01/04/2017   TIBC 225 01/04/2017   UIBC 172 01/04/2017   IRONPCTSAT 24 01/04/2017   Lab Results  Component Value Date   RETICCTPCT 0.6 03/21/2014   RBC 4.37 01/18/2017   RETICCTABS 27.5 03/21/2014   Lab Results  Component Value Date   KPAFRELGTCHN 5.30 (H) 11/07/2014   LAMBDASER 3.01 (H) 11/07/2014   KAPLAMBRATIO 1.88 (H) 01/04/2017   Lab Results  Component Value Date   IGGSERUM 2,130 (H) 01/04/2017   IGA 430 (H) 11/07/2014   IGMSERUM 59 01/04/2017   Lab Results  Component Value Date   TOTALPROTELP 8.4 (H) 11/07/2014   ALBUMINELP 4.2 11/07/2014   A1GS 0.3 11/07/2014   A2GS 1.0 (H) 11/07/2014   BETS  0.4 11/07/2014   BETA2SER 0.5 11/07/2014   GAMS 2.0 (H) 11/07/2014   MSPIKE 1.3 (H) 01/04/2017   SPEI * 11/07/2014     Chemistry      Component Value Date/Time   NA 141 01/11/2017 0937   K 4.3 01/11/2017 0937   CL 104 01/11/2017 0937   CO2 30 01/11/2017 0937   BUN 24 (H) 01/11/2017 0937   CREATININE 2.9 (H) 01/11/2017 0937      Component Value Date/Time   CALCIUM 9.0 01/11/2017 0937   ALKPHOS 81 01/11/2017 0937   AST 19 01/11/2017 0937   ALT 17 01/11/2017 0937   BILITOT 0.60 01/11/2017 0937      Impression and Plan: Mr. Haddix is a very pleasant 66 yo African American gentleman with IgG kappa myeloma. He has now started treatment with Kyprolis and cytoxan. He is doing well but still having some fatigue and generalized aches and pains "all over".  He also has both anemia of chronic renal insufficiency and intermittent iron deficiency anemia. Hgb is stable at 10.6. No Aransep needed at this visit.  He has his current treatment and appointment schedule.  He will contact our office with any questions or concerns. We can certainly see him sooner if need be.   Eliezer Bottom, NP 11/26/201812:16 PM

## 2017-01-18 NOTE — Progress Notes (Signed)
Patient reports generalized pain all over. Patient rates the pain a 8 on the 0 to 10 pain scale. Laverna Peace, NP notified.

## 2017-01-18 NOTE — Patient Instructions (Signed)
[facility] Discharge Instructions for Patients Receiving Chemotherapy  Today you received the following chemotherapy agents:  Kyprolis & Cytoxan  To help prevent nausea and vomiting after your treatment, we encourage you to take your nausea medication as prescribed.    If you develop nausea and vomiting that is not controlled by your nausea medication, call the clinic.   BELOW ARE SYMPTOMS THAT SHOULD BE REPORTED IMMEDIATELY:  *FEVER GREATER THAN 100.5 F  *CHILLS WITH OR WITHOUT FEVER  NAUSEA AND VOMITING THAT IS NOT CONTROLLED WITH YOUR NAUSEA MEDICATION  *UNUSUAL SHORTNESS OF BREATH  *UNUSUAL BRUISING OR BLEEDING  TENDERNESS IN MOUTH AND THROAT WITH OR WITHOUT PRESENCE OF ULCERS  *URINARY PROBLEMS  *BOWEL PROBLEMS  UNUSUAL RASH Items with * indicate a potential emergency and should be followed up as soon as possible.  Feel free to call the clinic should you have any questions or concerns. The clinic phone number is [phone].  Please show the CHEMO ALERT CARD at check-in to the Emergency Department and triage nurse.   

## 2017-01-25 ENCOUNTER — Other Ambulatory Visit: Payer: Medicare Other

## 2017-01-25 ENCOUNTER — Ambulatory Visit (HOSPITAL_BASED_OUTPATIENT_CLINIC_OR_DEPARTMENT_OTHER): Payer: Medicare Other

## 2017-01-25 ENCOUNTER — Other Ambulatory Visit (HOSPITAL_BASED_OUTPATIENT_CLINIC_OR_DEPARTMENT_OTHER): Payer: Medicare Other

## 2017-01-25 VITALS — BP 144/70 | HR 59 | Temp 98.0°F | Resp 20

## 2017-01-25 DIAGNOSIS — Z5112 Encounter for antineoplastic immunotherapy: Secondary | ICD-10-CM | POA: Diagnosis not present

## 2017-01-25 DIAGNOSIS — E349 Endocrine disorder, unspecified: Secondary | ICD-10-CM | POA: Diagnosis not present

## 2017-01-25 DIAGNOSIS — C9000 Multiple myeloma not having achieved remission: Secondary | ICD-10-CM | POA: Diagnosis not present

## 2017-01-25 DIAGNOSIS — Z299 Encounter for prophylactic measures, unspecified: Secondary | ICD-10-CM

## 2017-01-25 DIAGNOSIS — D500 Iron deficiency anemia secondary to blood loss (chronic): Secondary | ICD-10-CM

## 2017-01-25 LAB — CMP (CANCER CENTER ONLY)
ALK PHOS: 73 U/L (ref 26–84)
ALT: 17 U/L (ref 10–47)
AST: 20 U/L (ref 11–38)
Albumin: 3.5 g/dL (ref 3.3–5.5)
BUN: 22 mg/dL (ref 7–22)
CO2: 27 mEq/L (ref 18–33)
CREATININE: 2.8 mg/dL — AB (ref 0.6–1.2)
Calcium: 8.6 mg/dL (ref 8.0–10.3)
Chloride: 107 mEq/L (ref 98–108)
Glucose, Bld: 72 mg/dL — ABNORMAL LOW (ref 73–118)
POTASSIUM: 5.5 meq/L — AB (ref 3.3–4.7)
Sodium: 145 mEq/L (ref 128–145)
TOTAL PROTEIN: 7.8 g/dL (ref 6.4–8.1)
Total Bilirubin: 0.6 mg/dl (ref 0.20–1.60)

## 2017-01-25 LAB — CBC WITH DIFFERENTIAL (CANCER CENTER ONLY)
BASO#: 0 10*3/uL (ref 0.0–0.2)
BASO%: 0.6 % (ref 0.0–2.0)
EOS%: 4.3 % (ref 0.0–7.0)
Eosinophils Absolute: 0.2 10*3/uL (ref 0.0–0.5)
HEMATOCRIT: 31.1 % — AB (ref 38.7–49.9)
HEMOGLOBIN: 10.3 g/dL — AB (ref 13.0–17.1)
LYMPH#: 1.1 10*3/uL (ref 0.9–3.3)
LYMPH%: 19.8 % (ref 14.0–48.0)
MCH: 24.4 pg — ABNORMAL LOW (ref 28.0–33.4)
MCHC: 33.1 g/dL (ref 32.0–35.9)
MCV: 74 fL — AB (ref 82–98)
MONO#: 0.6 10*3/uL (ref 0.1–0.9)
MONO%: 11.7 % (ref 0.0–13.0)
NEUT%: 63.6 % (ref 40.0–80.0)
NEUTROS ABS: 3.4 10*3/uL (ref 1.5–6.5)
PLATELETS: 221 10*3/uL (ref 145–400)
RBC: 4.22 10*6/uL (ref 4.20–5.70)
RDW: 17.1 % — ABNORMAL HIGH (ref 11.1–15.7)
WBC: 5.3 10*3/uL (ref 4.0–10.0)

## 2017-01-25 LAB — IRON AND TIBC
%SAT: 39 % (ref 20–55)
Iron: 82 ug/dL (ref 42–163)
TIBC: 213 ug/dL (ref 202–409)
UIBC: 130 ug/dL (ref 117–376)

## 2017-01-25 LAB — FERRITIN

## 2017-01-25 MED ORDER — HEPARIN SOD (PORK) LOCK FLUSH 100 UNIT/ML IV SOLN
500.0000 [IU] | Freq: Once | INTRAVENOUS | Status: AC | PRN
Start: 2017-01-25 — End: 2017-01-25
  Administered 2017-01-25: 500 [IU]
  Filled 2017-01-25: qty 5

## 2017-01-25 MED ORDER — PALONOSETRON HCL INJECTION 0.25 MG/5ML
INTRAVENOUS | Status: AC
  Filled 2017-01-25: qty 5

## 2017-01-25 MED ORDER — TESTOSTERONE CYPIONATE 200 MG/ML IM SOLN
INTRAMUSCULAR | Status: AC
  Filled 2017-01-25: qty 2

## 2017-01-25 MED ORDER — DEXTROSE 5 % IV SOLN
20.0000 mg/m2 | Freq: Once | INTRAVENOUS | Status: AC
  Administered 2017-01-25: 42 mg via INTRAVENOUS
  Filled 2017-01-25: qty 21

## 2017-01-25 MED ORDER — SODIUM CHLORIDE 0.9 % IV SOLN
300.0000 mg/m2 | Freq: Once | INTRAVENOUS | Status: AC
  Administered 2017-01-25: 620 mg via INTRAVENOUS
  Filled 2017-01-25: qty 31

## 2017-01-25 MED ORDER — SODIUM CHLORIDE 0.9% FLUSH
10.0000 mL | INTRAVENOUS | Status: DC | PRN
  Administered 2017-01-25: 10 mL
  Filled 2017-01-25: qty 10

## 2017-01-25 MED ORDER — SODIUM CHLORIDE 0.9 % IV SOLN
Freq: Once | INTRAVENOUS | Status: AC
  Administered 2017-01-25: 09:00:00 via INTRAVENOUS

## 2017-01-25 MED ORDER — TESTOSTERONE CYPIONATE 200 MG/ML IM SOLN
300.0000 mg | INTRAMUSCULAR | Status: DC
  Administered 2017-01-25: 300 mg via INTRAMUSCULAR

## 2017-01-25 MED ORDER — DEXAMETHASONE SODIUM PHOSPHATE 10 MG/ML IJ SOLN
INTRAMUSCULAR | Status: AC
  Filled 2017-01-25: qty 1

## 2017-01-25 MED ORDER — PALONOSETRON HCL INJECTION 0.25 MG/5ML
0.2500 mg | Freq: Once | INTRAVENOUS | Status: AC
  Administered 2017-01-25: 0.25 mg via INTRAVENOUS

## 2017-01-25 MED ORDER — DEXAMETHASONE SODIUM PHOSPHATE 10 MG/ML IJ SOLN
10.0000 mg | Freq: Once | INTRAMUSCULAR | Status: AC
  Administered 2017-01-25: 10 mg via INTRAVENOUS

## 2017-01-25 NOTE — Progress Notes (Signed)
OK to treat with potassium-5.5 and creat-2.8 per Dr. [name].

## 2017-01-25 NOTE — Patient Instructions (Signed)
[facility] Discharge Instructions for Patients Receiving Chemotherapy  Today you received the following chemotherapy agents:  Kyprolis and Cytoxan  To help prevent nausea and vomiting after your treatment, we encourage you to take your nausea medication as ordered per MD.    If you develop nausea and vomiting that is not controlled by your nausea medication, call the clinic.   BELOW ARE SYMPTOMS THAT SHOULD BE REPORTED IMMEDIATELY:  *FEVER GREATER THAN 100.5 F  *CHILLS WITH OR WITHOUT FEVER  NAUSEA AND VOMITING THAT IS NOT CONTROLLED WITH YOUR NAUSEA MEDICATION  *UNUSUAL SHORTNESS OF BREATH  *UNUSUAL BRUISING OR BLEEDING  TENDERNESS IN MOUTH AND THROAT WITH OR WITHOUT PRESENCE OF ULCERS  *URINARY PROBLEMS  *BOWEL PROBLEMS  UNUSUAL RASH Items with * indicate a potential emergency and should be followed up as soon as possible.  Feel free to call the clinic should you have any questions or concerns. The clinic phone number is [phone].  Please show the CHEMO ALERT CARD at check-in to the Emergency Department and triage nurse.   

## 2017-01-26 LAB — IGG, IGA, IGM
IGM (IMMUNOGLOBIN M), SRM: 51 mg/dL (ref 20–172)
IgA, Qn, Serum: 261 mg/dL (ref 61–437)

## 2017-01-26 LAB — KAPPA/LAMBDA LIGHT CHAINS
IG KAPPA FREE LIGHT CHAIN: 32.5 mg/L — AB (ref 3.3–19.4)
IG LAMBDA FREE LIGHT CHAIN: 20.6 mg/L (ref 5.7–26.3)
KAPPA/LAMBDA FLC RATIO: 1.58 (ref 0.26–1.65)

## 2017-01-26 LAB — TESTOSTERONE: Testosterone, Serum: 248 ng/dL — ABNORMAL LOW (ref 264–916)

## 2017-01-28 LAB — PROTEIN ELECTROPHORESIS, SERUM, WITH REFLEX
A/G Ratio: 1 (ref 0.7–1.7)
ALBUMIN: 3.4 g/dL (ref 2.9–4.4)
ALPHA 1: 0.2 g/dL (ref 0.0–0.4)
Alpha 2: 0.8 g/dL (ref 0.4–1.0)
Beta: 0.9 g/dL (ref 0.7–1.3)
Gamma Globulin: 1.6 g/dL (ref 0.4–1.8)
Globulin, Total: 3.4 g/dL (ref 2.2–3.9)
INTERPRETATION(SEE BELOW): 0
M-Spike, %: 0.8 g/dL — ABNORMAL HIGH
Total Protein: 6.8 g/dL (ref 6.0–8.5)

## 2017-02-08 ENCOUNTER — Other Ambulatory Visit: Payer: Self-pay | Admitting: *Deleted

## 2017-02-08 ENCOUNTER — Other Ambulatory Visit (HOSPITAL_BASED_OUTPATIENT_CLINIC_OR_DEPARTMENT_OTHER): Payer: Medicare Other

## 2017-02-08 ENCOUNTER — Encounter: Payer: Self-pay | Admitting: Family

## 2017-02-08 ENCOUNTER — Other Ambulatory Visit: Payer: Self-pay

## 2017-02-08 ENCOUNTER — Other Ambulatory Visit: Payer: Medicare Other

## 2017-02-08 ENCOUNTER — Ambulatory Visit: Payer: Medicare Other

## 2017-02-08 ENCOUNTER — Ambulatory Visit (HOSPITAL_BASED_OUTPATIENT_CLINIC_OR_DEPARTMENT_OTHER): Payer: Medicare Other | Admitting: Family

## 2017-02-08 ENCOUNTER — Ambulatory Visit (HOSPITAL_BASED_OUTPATIENT_CLINIC_OR_DEPARTMENT_OTHER): Payer: Medicare Other

## 2017-02-08 DIAGNOSIS — N189 Chronic kidney disease, unspecified: Secondary | ICD-10-CM

## 2017-02-08 DIAGNOSIS — C9000 Multiple myeloma not having achieved remission: Secondary | ICD-10-CM

## 2017-02-08 DIAGNOSIS — Z5112 Encounter for antineoplastic immunotherapy: Secondary | ICD-10-CM

## 2017-02-08 DIAGNOSIS — E349 Endocrine disorder, unspecified: Secondary | ICD-10-CM

## 2017-02-08 DIAGNOSIS — D631 Anemia in chronic kidney disease: Secondary | ICD-10-CM | POA: Diagnosis not present

## 2017-02-08 DIAGNOSIS — G8929 Other chronic pain: Secondary | ICD-10-CM

## 2017-02-08 DIAGNOSIS — Z299 Encounter for prophylactic measures, unspecified: Secondary | ICD-10-CM

## 2017-02-08 DIAGNOSIS — E291 Testicular hypofunction: Secondary | ICD-10-CM | POA: Diagnosis not present

## 2017-02-08 DIAGNOSIS — M545 Low back pain, unspecified: Secondary | ICD-10-CM

## 2017-02-08 DIAGNOSIS — R5383 Other fatigue: Secondary | ICD-10-CM | POA: Diagnosis not present

## 2017-02-08 DIAGNOSIS — M4712 Other spondylosis with myelopathy, cervical region: Secondary | ICD-10-CM

## 2017-02-08 LAB — CBC WITH DIFFERENTIAL (CANCER CENTER ONLY)
BASO#: 0.1 10*3/uL (ref 0.0–0.2)
BASO%: 1.1 % (ref 0.0–2.0)
EOS ABS: 0.2 10*3/uL (ref 0.0–0.5)
EOS%: 4 % (ref 0.0–7.0)
HEMATOCRIT: 30.8 % — AB (ref 38.7–49.9)
HEMOGLOBIN: 10.2 g/dL — AB (ref 13.0–17.1)
LYMPH#: 1.4 10*3/uL (ref 0.9–3.3)
LYMPH%: 25 % (ref 14.0–48.0)
MCH: 24.6 pg — AB (ref 28.0–33.4)
MCHC: 33.1 g/dL (ref 32.0–35.9)
MCV: 74 fL — AB (ref 82–98)
MONO#: 0.7 10*3/uL (ref 0.1–0.9)
MONO%: 13.2 % — AB (ref 0.0–13.0)
NEUT%: 56.7 % (ref 40.0–80.0)
NEUTROS ABS: 3.1 10*3/uL (ref 1.5–6.5)
Platelets: 269 10*3/uL (ref 145–400)
RBC: 4.14 10*6/uL — AB (ref 4.20–5.70)
RDW: 17.6 % — ABNORMAL HIGH (ref 11.1–15.7)
WBC: 5.5 10*3/uL (ref 4.0–10.0)

## 2017-02-08 LAB — CMP (CANCER CENTER ONLY)
ALBUMIN: 3.5 g/dL (ref 3.3–5.5)
ALT(SGPT): 21 U/L (ref 10–47)
AST: 20 U/L (ref 11–38)
Alkaline Phosphatase: 75 U/L (ref 26–84)
BUN, Bld: 26 mg/dL — ABNORMAL HIGH (ref 7–22)
CALCIUM: 9 mg/dL (ref 8.0–10.3)
CHLORIDE: 101 meq/L (ref 98–108)
CO2: 30 meq/L (ref 18–33)
CREATININE: 2.8 mg/dL — AB (ref 0.6–1.2)
Glucose, Bld: 107 mg/dL (ref 73–118)
POTASSIUM: 4.6 meq/L (ref 3.3–4.7)
SODIUM: 146 meq/L — AB (ref 128–145)
TOTAL PROTEIN: 7.7 g/dL (ref 6.4–8.1)
Total Bilirubin: 0.5 mg/dl (ref 0.20–1.60)

## 2017-02-08 MED ORDER — PALONOSETRON HCL INJECTION 0.25 MG/5ML
0.2500 mg | Freq: Once | INTRAVENOUS | Status: AC
  Administered 2017-02-08: 0.25 mg via INTRAVENOUS

## 2017-02-08 MED ORDER — SODIUM CHLORIDE 0.9 % IV SOLN
Freq: Once | INTRAVENOUS | Status: AC
  Administered 2017-02-08: 10:00:00 via INTRAVENOUS

## 2017-02-08 MED ORDER — DARBEPOETIN ALFA 300 MCG/0.6ML IJ SOSY
PREFILLED_SYRINGE | INTRAMUSCULAR | Status: AC
  Filled 2017-02-08: qty 0.6

## 2017-02-08 MED ORDER — SODIUM CHLORIDE 0.9 % IV SOLN: Freq: Once | INTRAVENOUS | Status: AC

## 2017-02-08 MED ORDER — SODIUM CHLORIDE 0.9% FLUSH
10.0000 mL | INTRAVENOUS | Status: DC | PRN
  Administered 2017-02-08 (×2): 10 mL
  Filled 2017-02-08: qty 10

## 2017-02-08 MED ORDER — LORAZEPAM 0.5 MG PO TABS: 0.5000 mg | ORAL_TABLET | Freq: Four times a day (QID) | ORAL | 2 refills | Status: DC | PRN

## 2017-02-08 MED ORDER — DARBEPOETIN ALFA-POLYSORBATE 300 MCG/0.6ML IJ SOLN: 300.0000 ug | Freq: Once | INTRAMUSCULAR | Status: DC

## 2017-02-08 MED ORDER — DEXAMETHASONE SODIUM PHOSPHATE 10 MG/ML IJ SOLN
INTRAMUSCULAR | Status: AC
  Filled 2017-02-08: qty 1

## 2017-02-08 MED ORDER — LORAZEPAM 0.5 MG PO TABS: 0.5000 mg | ORAL_TABLET | Freq: Four times a day (QID) | ORAL | 0 refills | Status: DC | PRN

## 2017-02-08 MED ORDER — DARBEPOETIN ALFA 300 MCG/0.6ML IJ SOSY
300.0000 ug | PREFILLED_SYRINGE | Freq: Once | INTRAMUSCULAR | Status: AC
  Administered 2017-02-08: 300 ug via SUBCUTANEOUS

## 2017-02-08 MED ORDER — SODIUM CHLORIDE 0.9 % IV SOLN: 60.0000 mg | Freq: Once | INTRAVENOUS | Status: DC

## 2017-02-08 MED ORDER — SODIUM CHLORIDE 0.9% FLUSH
3.0000 mL | INTRAVENOUS | Status: DC | PRN
  Filled 2017-02-08: qty 10

## 2017-02-08 MED ORDER — SODIUM CHLORIDE 0.9 % IV SOLN
300.0000 mg/m2 | Freq: Once | INTRAVENOUS | Status: AC
  Administered 2017-02-08: 620 mg via INTRAVENOUS
  Filled 2017-02-08: qty 31

## 2017-02-08 MED ORDER — HYDROMORPHONE HCL 1 MG/ML IJ SOLN
INTRAMUSCULAR | Status: AC
  Filled 2017-02-08: qty 2

## 2017-02-08 MED ORDER — PALONOSETRON HCL INJECTION 0.25 MG/5ML
INTRAVENOUS | Status: AC
  Filled 2017-02-08: qty 5

## 2017-02-08 MED ORDER — HYDROMORPHONE HCL 4 MG/ML IJ SOLN
2.0000 mg | Freq: Once | INTRAMUSCULAR | Status: AC
  Administered 2017-02-08: 2 mg via INTRAVENOUS

## 2017-02-08 MED ORDER — DEXAMETHASONE SODIUM PHOSPHATE 10 MG/ML IJ SOLN
10.0000 mg | Freq: Once | INTRAMUSCULAR | Status: AC
  Administered 2017-02-08: 10 mg via INTRAVENOUS

## 2017-02-08 MED ORDER — CARFILZOMIB CHEMO INJECTION 60 MG
20.0000 mg/m2 | Freq: Once | INTRAVENOUS | Status: AC
  Administered 2017-02-08: 42 mg via INTRAVENOUS
  Filled 2017-02-08: qty 21

## 2017-02-08 MED ORDER — HEPARIN SOD (PORK) LOCK FLUSH 100 UNIT/ML IV SOLN
500.0000 [IU] | Freq: Once | INTRAVENOUS | Status: AC | PRN
  Administered 2017-02-08: 500 [IU]
  Filled 2017-02-08: qty 5

## 2017-02-08 NOTE — Patient Instructions (Signed)
Implanted Port Home Guide An implanted port is a type of central line that is placed under the skin. Central lines are used to provide IV access when treatment or nutrition needs to be given through a person's veins. Implanted ports are used for long-term IV access. An implanted port may be placed because:  You need IV medicine that would be irritating to the small veins in your hands or arms.  You need long-term IV medicines, such as antibiotics.  You need IV nutrition for a long period.  You need frequent blood draws for lab tests.  You need dialysis.  Implanted ports are usually placed in the chest area, but they can also be placed in the upper arm, the abdomen, or the leg. An implanted port has two main parts:  Reservoir. The reservoir is round and will appear as a small, raised area under your skin. The reservoir is the part where a needle is inserted to give medicines or draw blood.  Catheter. The catheter is a thin, flexible tube that extends from the reservoir. The catheter is placed into a large vein. Medicine that is inserted into the reservoir goes into the catheter and then into the vein.  How will I care for my incision site? Do not get the incision site wet. Bathe or shower as directed by your health care provider. How is my port accessed? Special steps must be taken to access the port:  Before the port is accessed, a numbing cream can be placed on the skin. This helps numb the skin over the port site.  Your health care provider uses a sterile technique to access the port. ? Your health care provider must put on a mask and sterile gloves. ? The skin over your port is cleaned carefully with an antiseptic and allowed to dry. ? The port is gently pinched between sterile gloves, and a needle is inserted into the port.  Only "non-coring" port needles should be used to access the port. Once the port is accessed, a blood return should be checked. This helps ensure that the port  is in the vein and is not clogged.  If your port needs to remain accessed for a constant infusion, a clear (transparent) bandage will be placed over the needle site. The bandage and needle will need to be changed every week, or as directed by your health care provider.  Keep the bandage covering the needle clean and dry. Do not get it wet. Follow your health care provider's instructions on how to take a shower or bath while the port is accessed.  If your port does not need to stay accessed, no bandage is needed over the port.  What is flushing? Flushing helps keep the port from getting clogged. Follow your health care provider's instructions on how and when to flush the port. Ports are usually flushed with saline solution or a medicine called heparin. The need for flushing will depend on how the port is used.  If the port is used for intermittent medicines or blood draws, the port will need to be flushed: ? After medicines have been given. ? After blood has been drawn. ? As part of routine maintenance.  If a constant infusion is running, the port may not need to be flushed.  How long will my port stay implanted? The port can stay in for as long as your health care provider thinks it is needed. When it is time for the port to come out, surgery will be   done to remove it. The procedure is similar to the one performed when the port was put in. When should I seek immediate medical care? When you have an implanted port, you should seek immediate medical care if:  You notice a bad smell coming from the incision site.  You have swelling, redness, or drainage at the incision site.  You have more swelling or pain at the port site or the surrounding area.  You have a fever that is not controlled with medicine.  This information is not intended to replace advice given to you by your health care provider. Make sure you discuss any questions you have with your health care provider. Document  Released: 02/09/2005 Document Revised: 07/18/2015 Document Reviewed: 10/17/2012 Elsevier Interactive Patient Education  2017 [employer]  

## 2017-02-08 NOTE — Progress Notes (Signed)
Hematology and Oncology Follow Up Visit  Emrah Ariola [id number] 14-Aug-1950 66 y.o. 02/08/2017   Principle Diagnosis:  IgG kappa myeloma Anemia secondary to renal insufficiency Intermittent iron-deficiency anemia Hypotestosteronemia  Current Therapy:   Aredia 60 mg IV q 2 months  Aranesp 300 mcg subcu as needed for hemoglobin less than 11 Depo-Testosterone 300 mg q 4 weeks IV iron as indicated Kyprolis/Cytoxan - started on 01/11/2017   Interim History:  Mr. Schoffstall is here today for follow-up and treatment. He is feeling fatigued. He received depotestosterone 2 weeks ago. Hgb is 10.2 so he will get Aranesp today.  Earlier this month M-spike was 0.8, IgG level 1,821 mg/dL and kappa light chain was down to 3.25 mg/dL.  No bruising or bleeding. No lymphadenopathy found on exam.  He has had no fever, chills, n/v, cough, rash, dizziness, SOB, chest pain, palpitations, abdominal pain or changes in bowel or bladder habits.   No swelling in his extremities. 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 He is still having back pain off and on.  No falls or syncopal episodes.  He has maintained a good appetite and is hydrating well. His weight is stable.   ECOG Performance Status: 1 - Symptomatic but completely ambulatory  Medications:  Allergies as of 02/08/2017      Reactions   Contrast Media [iodinated Diagnostic Agents]    Patient states he was instructed not to take IV contrast.  In 2008 he had an unknown reaction, and was told not to take it again.  He was also told not to take it due to his kidneys.   Iodine Anxiety, Rash   Didn't feel right "instructed not to take per MD--something with his port"      Medication List        Accurate as of 02/08/17  9:12 AM. Always use your most recent med list.          amLODipine 10 MG tablet Commonly known as:  NORVASC Take 10 mg by mouth daily.   CARAFATE 1 GM/10ML suspension Generic drug:  sucralfate Take 1 g by mouth 4  (four) times daily.   CLEAR EYES ALL SEASONS 5-6 MG/ML Soln Generic drug:  Polyvinyl Alcohol-Povidone Place 2 drops into both eyes 3 (three) times daily as needed (drye eyes.).   dicyclomine 10 MG capsule Commonly known as:  BENTYL Take 1 capsule (10 mg total) by mouth 3 (three) times daily.   EMBEDA 20-0.8 MG Cpcr Generic drug:  Morphine-Naltrexone Take 1 capsule by mouth 3 (three) times daily.   EMBEDA 30-1.2 MG Cpcr Generic drug:  Morphine-Naltrexone Take 3 (three) times daily by mouth.   furosemide 40 MG tablet Commonly known as:  LASIX Take 40 mg by mouth.   glipiZIDE 5 MG tablet Commonly known as:  GLUCOTROL Take 5 mg by mouth 2 (two) times daily before a meal.   isosorbide mononitrate 30 MG 24 hr tablet Commonly known as:  IMDUR Take 30 mg by mouth daily.   latanoprost 0.005 % ophthalmic solution Commonly known as:  XALATAN Place 1 drop into the right eye every evening.   lidocaine-prilocaine cream Commonly known as:  EMLA Apply 1 application topically as needed.   linaclotide 145 MCG Caps capsule Commonly known as:  LINZESS Take 1 capsule (145 mcg total) by mouth daily before breakfast.   LORazepam 0.5 MG tablet Commonly known as:  ATIVAN Take 1 tablet (0.5 mg total) every 6 (six) hours as needed by mouth (Nausea or vomiting).  losartan 25 MG tablet Commonly known as:  COZAAR Take 25 mg by mouth.   meclizine 50 MG tablet Commonly known as:  ANTIVERT Take 0.5 tablets (25 mg total) by mouth 4 (four) times daily as needed for dizziness.   methocarbamol 500 MG tablet Commonly known as:  ROBAXIN Take 500 mg by mouth 4 (four) times daily.   mirabegron ER 50 MG Tb24 tablet Commonly known as:  MYRBETRIQ Take 50 mg by mouth daily.   omeprazole 40 MG capsule Commonly known as:  PRILOSEC take 1 capsule by mouth twice a day   ondansetron 4 MG disintegrating tablet Commonly known as:  ZOFRAN ODT Take 1 tablet (4 mg total) by mouth every 8 (eight) hours  as needed for nausea or vomiting.   ondansetron 8 MG tablet Commonly known as:  ZOFRAN Take 1 tablet (8 mg total) 2 (two) times daily as needed by mouth (Nausea or vomiting).   orphenadrine 100 MG tablet Commonly known as:  NORFLEX Take 1 tablet (100 mg total) by mouth 2 (two) times daily.   oxyCODONE 20 mg 12 hr tablet Commonly known as:  OXYCONTIN Take 1 tablet (20 mg total) by mouth every 12 (twelve) hours.   oxyCODONE-acetaminophen 10-325 MG tablet Commonly known as:  PERCOCET Take 1 tablet by mouth every 6 (six) hours as needed for pain.   oxyCODONE-acetaminophen 10-325 MG tablet Commonly known as:  PERCOCET Take 1 tablet by mouth every 6 (six) hours as needed for pain.   potassium chloride SA 20 MEQ tablet Commonly known as:  K-DUR,KLOR-CON Take 1 tablet (20 mEq total) by mouth 2 (two) times daily.   prochlorperazine 10 MG tablet Commonly known as:  COMPAZINE Take 1 tablet (10 mg total) by mouth every 6 (six) hours as needed for nausea or vomiting.   prochlorperazine 10 MG tablet Commonly known as:  COMPAZINE Take 1 tablet (10 mg total) every 6 (six) hours as needed by mouth (Nausea or vomiting).   TOPROL XL 50 MG 24 hr tablet Generic drug:  metoprolol succinate Take 50 mg by mouth daily.   Vitamin D (Ergocalciferol) 50000 units Caps capsule Commonly known as:  DRISDOL Take 50,000 Units by mouth every Saturday.   ZIOPTAN 0.0015 % Soln Generic drug:  Tafluprost Place 1 drop into both eyes daily.       Allergies:  Allergies  Allergen Reactions  . Contrast Media [Iodinated Diagnostic Agents]     Patient states he was instructed not to take IV contrast.  In 2008 he had an unknown reaction, and was told not to take it again.  He was also told not to take it due to his kidneys.  . Iodine Anxiety and Rash    Didn't feel right "instructed not to take per MD--something with his port"    Past Medical History, Surgical history, Social history, and Family History  were reviewed and updated.  Review of Systems: All other 10 point review of systems is negative.   Physical Exam:  vitals were not taken for this visit.   Wt Readings from Last 3 Encounters:  02/08/17 195 lb 1.3 oz (88.5 kg)  01/18/17 195 lb 8 oz (88.7 kg)  01/04/17 191 lb (86.6 kg)    Ocular: Sclerae unicteric, pupils equal, round and reactive to light Ear-nose-throat: Oropharynx clear, dentition fair Lymphatic: No cervical, supraclavicular or axillary adenopathy Lungs no rales or rhonchi, good excursion bilaterally Heart regular rate and rhythm, no murmur appreciated Abd soft, nontender, positive bowel sounds, no liver  or spleen tip palpated on exam, no fluid wave  MSK no focal spinal tenderness, no joint edema Neuro: non-focal, well-oriented, appropriate affect Breasts: Deferred   Lab Results  Component Value Date   WBC 5.5 02/08/2017   HGB 10.2 (L) 02/08/2017   HCT 30.8 (L) 02/08/2017   MCV 74 (L) 02/08/2017   PLT 269 02/08/2017   Lab Results  Component Value Date   FERRITIN 1,093 (H) 01/25/2017   IRON 82 01/25/2017   TIBC 213 01/25/2017   UIBC 130 01/25/2017   IRONPCTSAT 39 01/25/2017   Lab Results  Component Value Date   RETICCTPCT 0.6 03/21/2014   RBC 4.14 (L) 02/08/2017   RETICCTABS 27.5 03/21/2014   Lab Results  Component Value Date   KPAFRELGTCHN 5.30 (H) 11/07/2014   LAMBDASER 3.01 (H) 11/07/2014   KAPLAMBRATIO 1.58 01/25/2017   Lab Results  Component Value Date   IGGSERUM 1,821 (H) 01/25/2017   IGA 430 (H) 11/07/2014   IGMSERUM 51 01/25/2017   Lab Results  Component Value Date   TOTALPROTELP 8.4 (H) 11/07/2014   ALBUMINELP 4.2 11/07/2014   A1GS 0.3 11/07/2014   A2GS 1.0 (H) 11/07/2014   BETS 0.4 11/07/2014   BETA2SER 0.5 11/07/2014   GAMS 2.0 (H) 11/07/2014   MSPIKE 0.8 (H) 01/25/2017   SPEI * 11/07/2014     Chemistry      Component Value Date/Time   NA 145 01/25/2017 0757   K 5.5 (H) 01/25/2017 0757   CL 107 01/25/2017 0757    CO2 27 01/25/2017 0757   BUN 22 01/25/2017 0757   CREATININE 2.8 (H) 01/25/2017 0757      Component Value Date/Time   CALCIUM 8.6 01/25/2017 0757   ALKPHOS 73 01/25/2017 0757   AST 20 01/25/2017 0757   ALT 17 01/25/2017 0757   BILITOT 0.60 01/25/2017 0757      Impression and Plan: Mr. Budreau is a very pleasant 66 yo African American gentleman with IgG kappa myeloma. So far he seems to be tolerating treatment well. He is still having some fatigue. His myeloma studies have slowly continued to improve.  We will proceed with treatment today as planned and also give him Aranesp for Hgb 10.2.  He has his current treatment and appointment scheduled. We will see him back on Christmas Eve.  He will contact our office with any questions or concerns. We can certainly see him sooner if need be.   Laverna Peace, NP 12/17/20189:12 AM

## 2017-02-08 NOTE — Patient Instructions (Signed)
Darbepoetin Alfa injection What is this medicine? DARBEPOETIN ALFA (dar be POE e tin AL fa) helps your body make more red blood cells. It is used to treat anemia caused by chronic kidney failure and chemotherapy. This medicine may be used for other purposes; ask your health care provider or pharmacist if you have questions. COMMON BRAND NAME(S): Aranesp What should I tell my health care provider before I take this medicine? They need to know if you have any of these conditions: -blood clotting disorders or history of blood clots -cancer patient not on chemotherapy -cystic fibrosis -heart disease, such as angina, heart failure, or a history of a heart attack -hemoglobin level of 12 g/dL or greater -high blood pressure -low levels of folate, iron, or vitamin B12 -seizures -an unusual or allergic reaction to darbepoetin, erythropoietin, albumin, hamster proteins, latex, other medicines, foods, dyes, or preservatives -pregnant or trying to get pregnant -breast-feeding How should I use this medicine? This medicine is for injection into a vein or under the skin. It is usually given by a health care professional in a hospital or clinic setting. If you get this medicine at home, you will be taught how to prepare and give this medicine. Use exactly as directed. Take your medicine at regular intervals. Do not take your medicine more often than directed. It is important that you put your used needles and syringes in a special sharps container. Do not put them in a trash can. If you do not have a sharps container, call your pharmacist or healthcare provider to get one. A special MedGuide will be given to you by the pharmacist with each prescription and refill. Be sure to read this information carefully each time. Talk to your pediatrician regarding the use of this medicine in children. While this medicine may be used in children as young as 1 year for selected conditions, precautions do  apply. Overdosage: If you think you have taken too much of this medicine contact a poison control center or emergency room at once. NOTE: This medicine is only for you. Do not share this medicine with others. What if I miss a dose? If you miss a dose, take it as soon as you can. If it is almost time for your next dose, take only that dose. Do not take double or extra doses. What may interact with this medicine? Do not take this medicine with any of the following medications: -epoetin alfa This list may not describe all possible interactions. Give your health care provider a list of all the medicines, herbs, non-prescription drugs, or dietary supplements you use. Also tell them if you smoke, drink alcohol, or use illegal drugs. Some items may interact with your medicine. What should I watch for while using this medicine? Your condition will be monitored carefully while you are receiving this medicine. You may need blood work done while you are taking this medicine. What side effects may I notice from receiving this medicine? Side effects that you should report to your doctor or health care professional as soon as possible: -allergic reactions like skin rash, itching or hives, swelling of the face, lips, or tongue -breathing problems -changes in vision -chest pain -confusion, trouble speaking or understanding -feeling faint or lightheaded, falls -high blood pressure -muscle aches or pains -pain, swelling, warmth in the leg -rapid weight gain -severe headaches -sudden numbness or weakness of the face, arm or leg -trouble walking, dizziness, loss of balance or coordination -seizures (convulsions) -swelling of the ankles, feet, hands -  unusually weak or tired Side effects that usually do not require medical attention (report to your doctor or health care professional if they continue or are bothersome): -diarrhea -fever, chills (flu-like symptoms) -headaches -nausea, vomiting -redness,  stinging, or swelling at site where injected This list may not describe all possible side effects. Call your doctor for medical advice about side effects. You may report side effects to FDA at 1-800-FDA-1088. Where should I keep my medicine? Keep out of the reach of children. Store in a refrigerator between 2 and 8 degrees C (36 and 46 degrees F). Do not freeze. Do not shake. Throw away any unused portion if using a single-dose vial. Throw away any unused medicine after the expiration date. NOTE: This sheet is a summary. It may not cover all possible information. If you have questions about this medicine, talk to your doctor, pharmacist, or health care provider.  2018 Elsevier/Gold Standard (2015-09-30 19:52:26) Cyclophosphamide injection What is this medicine? CYCLOPHOSPHAMIDE (sye kloe FOSS fa mide) is a chemotherapy drug. It slows the growth of cancer cells. This medicine is used to treat many types of cancer like lymphoma, myeloma, leukemia, breast cancer, and ovarian cancer, to name a few. This medicine may be used for other purposes; ask your health care provider or pharmacist if you have questions. COMMON BRAND NAME(S): Cytoxan, Neosar What should I tell my health care provider before I take this medicine? They need to know if you have any of these conditions: -blood disorders -history of other chemotherapy -infection -kidney disease -liver disease -recent or ongoing radiation therapy -tumors in the bone marrow -an unusual or allergic reaction to cyclophosphamide, other chemotherapy, other medicines, foods, dyes, or preservatives -pregnant or trying to get pregnant -breast-feeding How should I use this medicine? This drug is usually given as an injection into a vein or muscle or by infusion into a vein. It is administered in a hospital or clinic by a specially trained health care professional. Talk to your pediatrician regarding the use of this medicine in children. Special care may  be needed. Overdosage: If you think you have taken too much of this medicine contact a poison control center or emergency room at once. NOTE: This medicine is only for you. Do not share this medicine with others. What if I miss a dose? It is important not to miss your dose. Call your doctor or health care professional if you are unable to keep an appointment. What may interact with this medicine? This medicine may interact with the following medications: -amiodarone -amphotericin B -azathioprine -certain antiviral medicines for HIV or AIDS such as protease inhibitors (e.g., indinavir, ritonavir) and zidovudine -certain blood pressure medications such as benazepril, captopril, enalapril, fosinopril, lisinopril, moexipril, monopril, perindopril, quinapril, ramipril, trandolapril -certain cancer medications such as anthracyclines (e.g., daunorubicin, doxorubicin), busulfan, cytarabine, paclitaxel, pentostatin, tamoxifen, trastuzumab -certain diuretics such as chlorothiazide, chlorthalidone, hydrochlorothiazide, indapamide, metolazone -certain medicines that treat or prevent blood clots like warfarin -certain muscle relaxants such as succinylcholine -cyclosporine -etanercept -indomethacin -medicines to increase blood counts like filgrastim, pegfilgrastim, sargramostim -medicines used as general anesthesia -metronidazole -natalizumab This list may not describe all possible interactions. Give your health care provider a list of all the medicines, herbs, non-prescription drugs, or dietary supplements you use. Also tell them if you smoke, drink alcohol, or use illegal drugs. Some items may interact with your medicine. What should I watch for while using this medicine? Visit your doctor for checks on your progress. This drug may make you feel generally unwell.  This is not uncommon, as chemotherapy can affect healthy cells as well as cancer cells. Report any side effects. Continue your course of  treatment even though you feel ill unless your doctor tells you to stop. Drink water or other fluids as directed. Urinate often, even at night. In some cases, you may be given additional medicines to help with side effects. Follow all directions for their use. Call your doctor or health care professional for advice if you get a fever, chills or sore throat, or other symptoms of a cold or flu. Do not treat yourself. This drug decreases your body's ability to fight infections. Try to avoid being around people who are sick. This medicine may increase your risk to bruise or bleed. Call your doctor or health care professional if you notice any unusual bleeding. Be careful brushing and flossing your teeth or using a toothpick because you may get an infection or bleed more easily. If you have any dental work done, tell your dentist you are receiving this medicine. You may get drowsy or dizzy. Do not drive, use machinery, or do anything that needs mental alertness until you know how this medicine affects you. Do not become pregnant while taking this medicine or for 1 year after stopping it. Women should inform their doctor if they wish to become pregnant or think they might be pregnant. Men should not father a child while taking this medicine and for 4 months after stopping it. There is a potential for serious side effects to an unborn child. Talk to your health care professional or pharmacist for more information. Do not breast-feed an infant while taking this medicine. This medicine may interfere with the ability to have a child. This medicine has caused ovarian failure in some women. This medicine has caused reduced sperm counts in some men. You should talk with your doctor or health care professional if you are concerned about your fertility. If you are going to have surgery, tell your doctor or health care professional that you have taken this medicine. What side effects may I notice from receiving this  medicine? Side effects that you should report to your doctor or health care professional as soon as possible: -allergic reactions like skin rash, itching or hives, swelling of the face, lips, or tongue -low blood counts - this medicine may decrease the number of white blood cells, red blood cells and platelets. You may be at increased risk for infections and bleeding. -signs of infection - fever or chills, cough, sore throat, pain or difficulty passing urine -signs of decreased platelets or bleeding - bruising, pinpoint red spots on the skin, black, tarry stools, blood in the urine -signs of decreased red blood cells - unusually weak or tired, fainting spells, lightheadedness -breathing problems -dark urine -dizziness -palpitations -swelling of the ankles, feet, hands -trouble passing urine or change in the amount of urine -weight gain -yellowing of the eyes or skin Side effects that usually do not require medical attention (report to your doctor or health care professional if they continue or are bothersome): -changes in nail or skin color -hair loss -missed menstrual periods -mouth sores -nausea, vomiting This list may not describe all possible side effects. Call your doctor for medical advice about side effects. You may report side effects to FDA at 1-800-FDA-1088. Where should I keep my medicine? This drug is given in a hospital or clinic and will not be stored at home. NOTE: This sheet is a summary. It may not cover all  possible information. If you have questions about this medicine, talk to your doctor, pharmacist, or health care provider.  2018 Elsevier/Gold Standard (2011-12-25 16:22:58) Carfilzomib injection What is this medicine? CARFILZOMIB (kar FILZ oh mib) targets a specific protein within cancer cells and stops the cancer cells from growing. It is used to treat multiple myeloma. This medicine may be used for other purposes; ask your health care provider or pharmacist if  you have questions. COMMON BRAND NAME(S): KYPROLIS What should I tell my health care provider before I take this medicine? They need to know if you have any of these conditions: -heart disease -history of blood clots -irregular heartbeat -kidney disease -liver disease -lung or breathing disease -an unusual or allergic reaction to carfilzomib, or other medicines, foods, dyes, or preservatives -pregnant or trying to get pregnant -breast-feeding How should I use this medicine? This medicine is for injection or infusion into a vein. It is given by a health care professional in a hospital or clinic setting. Talk to your pediatrician regarding the use of this medicine in children. Special care may be needed. Overdosage: If you think you have taken too much of this medicine contact a poison control center or emergency room at once. NOTE: This medicine is only for you. Do not share this medicine with others. What if I miss a dose? It is important not to miss your dose. Call your doctor or health care professional if you are unable to keep an appointment. What may interact with this medicine? Interactions are not expected. Give your health care provider a list of all the medicines, herbs, non-prescription drugs, or dietary supplements you use. Also tell them if you smoke, drink alcohol, or use illegal drugs. Some items may interact with your medicine. This list may not describe all possible interactions. Give your health care provider a list of all the medicines, herbs, non-prescription drugs, or dietary supplements you use. Also tell them if you smoke, drink alcohol, or use illegal drugs. Some items may interact with your medicine. What should I watch for while using this medicine? Your condition will be monitored carefully while you are receiving this medicine. Report any side effects. Continue your course of treatment even though you feel ill unless your doctor tells you to stop. You may need  blood work done while you are taking this medicine. Do not become pregnant while taking this medicine or for at least 30 days after stopping it. Women should inform their doctor if they wish to become pregnant or think they might be pregnant. There is a potential for serious side effects to an unborn child. Men should not father a child while taking this medicine and for 90 days after stopping it. Talk to your health care professional or pharmacist for more information. Do not breast-feed an infant while taking this medicine. Check with your doctor or health care professional if you get an attack of severe diarrhea, nausea and vomiting, or if you sweat a lot. The loss of too much body fluid can make it dangerous for you to take this medicine. You may get dizzy. Do not drive, use machinery, or do anything that needs mental alertness until you know how this medicine affects you. Do not stand or sit up quickly, especially if you are an older patient. This reduces the risk of dizzy or fainting spells. What side effects may I notice from receiving this medicine? Side effects that you should report to your doctor or health care professional as soon as  possible: -allergic reactions like skin rash, itching or hives, swelling of the face, lips, or tongue -confusion -dizziness -feeling faint or lightheaded -fever or chills -palpitations -seizures -signs and symptoms of bleeding such as bloody or black, tarry stools; red or dark-brown urine; spitting up blood or brown material that looks like coffee grounds; red spots on the skin; unusual bruising or bleeding including from the eye, gums, or nose -signs and symptoms of a blood clot such as breathing problems; changes in vision; chest pain; severe, sudden headache; pain, swelling, warmth in the leg; trouble speaking; sudden numbness or weakness of the face, arm or leg -signs and symptoms of kidney injury like trouble passing urine or change in the amount of  urine -signs and symptoms of liver injury like dark yellow or brown urine; general ill feeling or flu-like symptoms; light-colored stools; loss of appetite; nausea; right upper belly pain; unusually weak or tired; yellowing of the eyes or skin Side effects that usually do not require medical attention (report to your doctor or health care professional if they continue or are bothersome): -back pain -cough -diarrhea -headache -muscle cramps -vomiting This list may not describe all possible side effects. Call your doctor for medical advice about side effects. You may report side effects to FDA at 1-800-FDA-1088. Where should I keep my medicine? This drug is given in a hospital or clinic and will not be stored at home. NOTE: This sheet is a summary. It may not cover all possible information. If you have questions about this medicine, talk to your doctor, pharmacist, or health care provider.  2018 Elsevier/Gold Standard (2015-03-14 13:39:23)

## 2017-02-15 ENCOUNTER — Other Ambulatory Visit: Payer: Medicare Other

## 2017-02-15 ENCOUNTER — Ambulatory Visit: Payer: Medicare Other

## 2017-02-15 ENCOUNTER — Other Ambulatory Visit (HOSPITAL_BASED_OUTPATIENT_CLINIC_OR_DEPARTMENT_OTHER): Payer: Medicare Other

## 2017-02-15 ENCOUNTER — Ambulatory Visit (HOSPITAL_BASED_OUTPATIENT_CLINIC_OR_DEPARTMENT_OTHER): Payer: Medicare Other | Admitting: Hematology & Oncology

## 2017-02-15 ENCOUNTER — Ambulatory Visit (HOSPITAL_BASED_OUTPATIENT_CLINIC_OR_DEPARTMENT_OTHER): Payer: Medicare Other

## 2017-02-15 ENCOUNTER — Encounter: Payer: Self-pay | Admitting: Hematology & Oncology

## 2017-02-15 ENCOUNTER — Other Ambulatory Visit: Payer: Self-pay

## 2017-02-15 DIAGNOSIS — Z5112 Encounter for antineoplastic immunotherapy: Secondary | ICD-10-CM | POA: Diagnosis not present

## 2017-02-15 DIAGNOSIS — M545 Low back pain: Secondary | ICD-10-CM

## 2017-02-15 DIAGNOSIS — C9000 Multiple myeloma not having achieved remission: Secondary | ICD-10-CM

## 2017-02-15 DIAGNOSIS — G8929 Other chronic pain: Secondary | ICD-10-CM

## 2017-02-15 DIAGNOSIS — M4712 Other spondylosis with myelopathy, cervical region: Secondary | ICD-10-CM

## 2017-02-15 LAB — CMP (CANCER CENTER ONLY)
ALBUMIN: 3.5 g/dL (ref 3.3–5.5)
ALT(SGPT): 21 U/L (ref 10–47)
AST: 19 U/L (ref 11–38)
Alkaline Phosphatase: 76 U/L (ref 26–84)
BILIRUBIN TOTAL: 0.5 mg/dL (ref 0.20–1.60)
BUN: 19 mg/dL (ref 7–22)
CHLORIDE: 103 meq/L (ref 98–108)
CO2: 28 meq/L (ref 18–33)
CREATININE: 2.4 mg/dL — AB (ref 0.6–1.2)
Calcium: 8.9 mg/dL (ref 8.0–10.3)
GLUCOSE: 165 mg/dL — AB (ref 73–118)
Potassium: 4.2 mEq/L (ref 3.3–4.7)
SODIUM: 141 meq/L (ref 128–145)
Total Protein: 7.5 g/dL (ref 6.4–8.1)

## 2017-02-15 LAB — CBC WITH DIFFERENTIAL (CANCER CENTER ONLY)
BASO#: 0 10*3/uL (ref 0.0–0.2)
BASO%: 0.4 % (ref 0.0–2.0)
EOS ABS: 0.3 10*3/uL (ref 0.0–0.5)
EOS%: 5 % (ref 0.0–7.0)
HCT: 33.6 % — ABNORMAL LOW (ref 38.7–49.9)
HEMOGLOBIN: 11 g/dL — AB (ref 13.0–17.1)
LYMPH#: 1 10*3/uL (ref 0.9–3.3)
LYMPH%: 18.1 % (ref 14.0–48.0)
MCH: 24.8 pg — AB (ref 28.0–33.4)
MCHC: 32.7 g/dL (ref 32.0–35.9)
MCV: 76 fL — ABNORMAL LOW (ref 82–98)
MONO#: 0.8 10*3/uL (ref 0.1–0.9)
MONO%: 15.6 % — AB (ref 0.0–13.0)
NEUT%: 60.9 % (ref 40.0–80.0)
NEUTROS ABS: 3.3 10*3/uL (ref 1.5–6.5)
RBC: 4.43 10*6/uL (ref 4.20–5.70)
RDW: 20 % — ABNORMAL HIGH (ref 11.1–15.7)
WBC: 5.4 10*3/uL (ref 4.0–10.0)

## 2017-02-15 LAB — FERRITIN: Ferritin: 989 ng/ml — ABNORMAL HIGH (ref 22–316)

## 2017-02-15 LAB — IRON AND TIBC
%SAT: 24 % (ref 20–55)
Iron: 55 ug/dL (ref 42–163)
TIBC: 224 ug/dL (ref 202–409)
UIBC: 169 ug/dL (ref 117–376)

## 2017-02-15 LAB — TECHNOLOGIST REVIEW CHCC SATELLITE

## 2017-02-15 LAB — LACTATE DEHYDROGENASE: LDH: 185 U/L (ref 125–245)

## 2017-02-15 MED ORDER — DEXAMETHASONE SODIUM PHOSPHATE 10 MG/ML IJ SOLN
INTRAMUSCULAR | Status: AC
  Filled 2017-02-15: qty 1

## 2017-02-15 MED ORDER — SODIUM CHLORIDE 0.9% FLUSH
10.0000 mL | INTRAVENOUS | Status: DC | PRN
  Administered 2017-02-15: 10 mL
  Filled 2017-02-15: qty 10

## 2017-02-15 MED ORDER — PALONOSETRON HCL INJECTION 0.25 MG/5ML
0.2500 mg | Freq: Once | INTRAVENOUS | Status: AC
  Administered 2017-02-15: 0.25 mg via INTRAVENOUS

## 2017-02-15 MED ORDER — SODIUM CHLORIDE 0.9 % IV SOLN
Freq: Once | INTRAVENOUS | Status: AC
  Administered 2017-02-15: 09:00:00 via INTRAVENOUS

## 2017-02-15 MED ORDER — DEXTROSE 5 % IV SOLN
20.0000 mg/m2 | Freq: Once | INTRAVENOUS | Status: AC
  Administered 2017-02-15: 42 mg via INTRAVENOUS
  Filled 2017-02-15: qty 21

## 2017-02-15 MED ORDER — HYDROMORPHONE HCL 1 MG/ML IJ SOLN
INTRAMUSCULAR | Status: AC
  Filled 2017-02-15: qty 2

## 2017-02-15 MED ORDER — SODIUM CHLORIDE 0.9 % IV SOLN
300.0000 mg/m2 | Freq: Once | INTRAVENOUS | Status: AC
  Administered 2017-02-15: 620 mg via INTRAVENOUS
  Filled 2017-02-15: qty 31

## 2017-02-15 MED ORDER — PALONOSETRON HCL INJECTION 0.25 MG/5ML
INTRAVENOUS | Status: AC
  Filled 2017-02-15: qty 5

## 2017-02-15 MED ORDER — HYDROMORPHONE HCL 1 MG/ML IJ SOLN
2.0000 mg | Freq: Once | INTRAMUSCULAR | Status: AC
Start: 2017-02-15 — End: 2017-02-15
  Administered 2017-02-15: 2 mg via INTRAVENOUS

## 2017-02-15 MED ORDER — HEPARIN SOD (PORK) LOCK FLUSH 100 UNIT/ML IV SOLN
500.0000 [IU] | Freq: Once | INTRAVENOUS | Status: AC | PRN
  Administered 2017-02-15: 500 [IU]
  Filled 2017-02-15: qty 5

## 2017-02-15 MED ORDER — DEXAMETHASONE SODIUM PHOSPHATE 10 MG/ML IJ SOLN
10.0000 mg | Freq: Once | INTRAMUSCULAR | Status: AC
  Administered 2017-02-15: 10 mg via INTRAVENOUS

## 2017-02-15 NOTE — Patient Instructions (Signed)
[facility] Discharge Instructions for Patients Receiving Chemotherapy  Today you received the following chemotherapy agents:  Kyprolis and Cytoxan  To help prevent nausea and vomiting after your treatment, we encourage you to take your nausea medication as ordered per MD.    If you develop nausea and vomiting that is not controlled by your nausea medication, call the clinic.   BELOW ARE SYMPTOMS THAT SHOULD BE REPORTED IMMEDIATELY:  *FEVER GREATER THAN 100.5 F  *CHILLS WITH OR WITHOUT FEVER  NAUSEA AND VOMITING THAT IS NOT CONTROLLED WITH YOUR NAUSEA MEDICATION  *UNUSUAL SHORTNESS OF BREATH  *UNUSUAL BRUISING OR BLEEDING  TENDERNESS IN MOUTH AND THROAT WITH OR WITHOUT PRESENCE OF ULCERS  *URINARY PROBLEMS  *BOWEL PROBLEMS  UNUSUAL RASH Items with * indicate a potential emergency and should be followed up as soon as possible.  Feel free to call the clinic should you have any questions or concerns. The clinic phone number is [phone].  Please show the CHEMO ALERT CARD at check-in to the Emergency Department and triage nurse.   

## 2017-02-15 NOTE — Progress Notes (Signed)
Hematology and Oncology Follow Up Visit  [name] [id number] 28-Oct-1950 66 y.o. 02/15/2017   Principle Diagnosis:  IgG kappa myeloma Anemia secondary to renal insufficiency Intermittent iron-deficiency anemia Hypotestosteronemia  Current Therapy:   Aredia 60 mg IV q 2 months  Aranesp 300 mcg subcu as needed for hemoglobin less than 11 Depo-Testosterone 300 mg q 4 weeks IV iron as indicated Kyprolis/Cytoxan - s/p cycle #1   Interim History:  [name] is here today for follow-up and treatment.  He actually is feeling pretty well.  I think the treatments are helping.  He is not having as much pain.  There is been no issues with nausea or vomiting.  There is been no bleeding.  His myeloma studies have come down quite nicely.  His M spike is now down to 0.8 g/dL.  His IgG level is 1800 mg/dL.  His kappa light chain is down to 3.2 mg/dL.  He has had no fever.  He does have diabetes.  He is trying to watch his blood sugars.  Overall, his performance status is ECOG 1.  Medications:  Allergies as of 02/15/2017      Reactions   Contrast Media [iodinated Diagnostic Agents]    Patient states he was instructed not to take IV contrast.  In 2008 he had an unknown reaction, and was told not to take it again.  He was also told not to take it due to his kidneys.   Iodine Anxiety, Rash, Other (See Comments)   Didn't feel right "instructed not to take per MD--something with his port" Didn't feel right      Medication List        Accurate as of 02/15/17  8:58 AM. Always use your most recent med list.          amLODipine 10 MG tablet Commonly known as:  NORVASC Take 10 mg by mouth daily.   CARAFATE 1 GM/10ML suspension Generic drug:  sucralfate Take 1 g by mouth 4 (four) times daily.   CLEAR EYES ALL SEASONS 5-6 MG/ML Soln Generic drug:  Polyvinyl Alcohol-Povidone Place 2 drops into both eyes 3 (three) times daily as needed (drye eyes.).   dicyclomine 10 MG  capsule Commonly known as:  BENTYL Take 1 capsule (10 mg total) by mouth 3 (three) times daily.   EMBEDA 20-0.8 MG Cpcr Generic drug:  Morphine-Naltrexone Take 1 capsule by mouth 3 (three) times daily.   EMBEDA 30-1.2 MG Cpcr Generic drug:  Morphine-Naltrexone Take 3 (three) times daily by mouth.   furosemide 40 MG tablet Commonly known as:  LASIX Take 40 mg by mouth.   glipiZIDE 5 MG tablet Commonly known as:  GLUCOTROL Take 5 mg by mouth 2 (two) times daily before a meal.   isosorbide mononitrate 30 MG 24 hr tablet Commonly known as:  IMDUR Take 30 mg by mouth daily.   latanoprost 0.005 % ophthalmic solution Commonly known as:  XALATAN Place 1 drop into the right eye every evening.   lidocaine-prilocaine cream Commonly known as:  EMLA Apply 1 application topically as needed.   linaclotide 145 MCG Caps capsule Commonly known as:  LINZESS Take 1 capsule (145 mcg total) by mouth daily before breakfast.   LORazepam 0.5 MG tablet Commonly known as:  ATIVAN Take 1 tablet (0.5 mg total) by mouth every 6 (six) hours as needed (Nausea or vomiting).   losartan 25 MG tablet Commonly known as:  COZAAR Take 25 mg by mouth.   meclizine 50 MG tablet  Commonly known as:  ANTIVERT Take 0.5 tablets (25 mg total) by mouth 4 (four) times daily as needed for dizziness.   methocarbamol 500 MG tablet Commonly known as:  ROBAXIN Take 500 mg by mouth 4 (four) times daily.   mirabegron ER 50 MG Tb24 tablet Commonly known as:  MYRBETRIQ Take 50 mg by mouth daily.   omeprazole 40 MG capsule Commonly known as:  PRILOSEC take 1 capsule by mouth twice a day   ondansetron 4 MG disintegrating tablet Commonly known as:  ZOFRAN ODT Take 1 tablet (4 mg total) by mouth every 8 (eight) hours as needed for nausea or vomiting.   ondansetron 8 MG tablet Commonly known as:  ZOFRAN Take 1 tablet (8 mg total) 2 (two) times daily as needed by mouth (Nausea or vomiting).   orphenadrine 100 MG  tablet Commonly known as:  NORFLEX Take 1 tablet (100 mg total) by mouth 2 (two) times daily.   oxyCODONE 20 mg 12 hr tablet Commonly known as:  OXYCONTIN Take 1 tablet (20 mg total) by mouth every 12 (twelve) hours.   oxyCODONE-acetaminophen 10-325 MG tablet Commonly known as:  PERCOCET Take 1 tablet by mouth every 6 (six) hours as needed for pain.   oxyCODONE-acetaminophen 10-325 MG tablet Commonly known as:  PERCOCET Take 1 tablet by mouth every 6 (six) hours as needed for pain.   potassium chloride SA 20 MEQ tablet Commonly known as:  K-DUR,KLOR-CON Take 1 tablet (20 mEq total) by mouth 2 (two) times daily.   prochlorperazine 10 MG tablet Commonly known as:  COMPAZINE Take 1 tablet (10 mg total) by mouth every 6 (six) hours as needed for nausea or vomiting.   prochlorperazine 10 MG tablet Commonly known as:  COMPAZINE Take 1 tablet (10 mg total) every 6 (six) hours as needed by mouth (Nausea or vomiting).   TOPROL XL 50 MG 24 hr tablet Generic drug:  metoprolol succinate Take 50 mg by mouth daily.   Vitamin D (Ergocalciferol) 50000 units Caps capsule Commonly known as:  DRISDOL Take 50,000 Units by mouth every Saturday.   ZIOPTAN 0.0015 % Soln Generic drug:  Tafluprost Place 1 drop into both eyes daily.       Allergies:  Allergies  Allergen Reactions  . Contrast Media [Iodinated Diagnostic Agents]     Patient states he was instructed not to take IV contrast.  In 2008 he had an unknown reaction, and was told not to take it again.  He was also told not to take it due to his kidneys.  . Iodine Anxiety, Rash and Other (See Comments)    Didn't feel right "instructed not to take per MD--something with his port" Didn't feel right    Past Medical History, Surgical history, Social history, and Family History were reviewed and updated.  Review of Systems: As stated in the interim history.   Physical Exam:  vitals were not taken for this visit.   Wt Readings  from Last 3 Encounters:  02/15/17 189 lb (85.7 kg)  02/08/17 195 lb 1.3 oz (88.5 kg)  01/18/17 195 lb 8 oz (88.7 kg)    Physical Exam  Constitutional: He is oriented to person, place, and time.  HENT:  Head: Normocephalic and atraumatic.  Mouth/Throat: Oropharynx is clear and moist.  Eyes: EOM are normal. Pupils are equal, round, and reactive to light.  Neck: Normal range of motion.  Cardiovascular: Normal rate, regular rhythm and normal heart sounds.  Pulmonary/Chest: Effort normal and breath sounds normal.  Abdominal: Soft. Bowel sounds are normal.  Musculoskeletal: Normal range of motion. He exhibits no edema, tenderness or deformity.  Lymphadenopathy:    He has no cervical adenopathy.  Neurological: He is alert and oriented to person, place, and time.  Skin: Skin is warm and dry. No rash noted. No erythema.  Psychiatric: He has a normal mood and affect. His behavior is normal. Judgment and thought content normal.  Vitals reviewed.    Lab Results  Component Value Date   WBC 5.5 02/08/2017   HGB 10.2 (L) 02/08/2017   HCT 30.8 (L) 02/08/2017   MCV 74 (L) 02/08/2017   PLT 269 02/08/2017   Lab Results  Component Value Date   FERRITIN 1,093 (H) 01/25/2017   IRON 82 01/25/2017   TIBC 213 01/25/2017   UIBC 130 01/25/2017   IRONPCTSAT 39 01/25/2017   Lab Results  Component Value Date   RETICCTPCT 0.6 03/21/2014   RBC 4.14 (L) 02/08/2017   RETICCTABS 27.5 03/21/2014   Lab Results  Component Value Date   KPAFRELGTCHN 5.30 (H) 11/07/2014   LAMBDASER 3.01 (H) 11/07/2014   KAPLAMBRATIO 1.58 01/25/2017   Lab Results  Component Value Date   IGGSERUM 1,821 (H) 01/25/2017   IGA 430 (H) 11/07/2014   IGMSERUM 51 01/25/2017   Lab Results  Component Value Date   TOTALPROTELP 8.4 (H) 11/07/2014   ALBUMINELP 4.2 11/07/2014   A1GS 0.3 11/07/2014   A2GS 1.0 (H) 11/07/2014   BETS 0.4 11/07/2014   BETA2SER 0.5 11/07/2014   GAMS 2.0 (H) 11/07/2014   MSPIKE 0.8 (H)  01/25/2017   SPEI * 11/07/2014     Chemistry      Component Value Date/Time   NA 146 (H) 02/08/2017 0837   K 4.6 02/08/2017 0837   CL 101 02/08/2017 0837   CO2 30 02/08/2017 0837   BUN 26 (H) 02/08/2017 0837   CREATININE 2.8 (H) 02/08/2017 0837      Component Value Date/Time   CALCIUM 9.0 02/08/2017 0837   ALKPHOS 75 02/08/2017 0837   AST 20 02/08/2017 0837   ALT 21 02/08/2017 0837   BILITOT 0.50 02/08/2017 0837      Impression and Plan: [name] is a very pleasant 66 yo African American gentleman with IgG kappa myeloma.  He is done very well with the Kyprolis/Cytoxan protocol.  We will go ahead with his treatment this cycle.  This will be the completion of his second cycle.  I will see him back on the first day of his third cycle.  I am glad that we are making progress with his myeloma.  Volanda Napoleon, MD 12/24/20188:58 AM

## 2017-02-15 NOTE — Progress Notes (Signed)
OK to treat with creat-2.4 per Dr. [name].  0920-Patient complaining of pain "all over" at a 10/10 and requesting pain medicine.  Dr. [name] notified and order received to give pt Dilaudid 2 mg IV now. 0955-Pt states that pain has "decreased slightly" and has no further requests at this time.

## 2017-02-16 LAB — IGG, IGA, IGM
IGA/IMMUNOGLOBULIN A, SERUM: 232 mg/dL (ref 61–437)
IGM (IMMUNOGLOBIN M), SRM: 43 mg/dL (ref 20–172)

## 2017-02-16 LAB — TESTOSTERONE: TESTOSTERONE: 493 ng/dL (ref 264–916)

## 2017-02-17 LAB — KAPPA/LAMBDA LIGHT CHAINS
Ig Kappa Free Light Chain: 29.7 mg/L — ABNORMAL HIGH (ref 3.3–19.4)
Ig Lambda Free Light Chain: 17.8 mg/L (ref 5.7–26.3)
KAPPA/LAMBDA FLC RATIO: 1.67 — AB (ref 0.26–1.65)

## 2017-02-19 ENCOUNTER — Other Ambulatory Visit: Payer: Self-pay | Admitting: *Deleted

## 2017-02-19 DIAGNOSIS — C9000 Multiple myeloma not having achieved remission: Secondary | ICD-10-CM

## 2017-02-19 LAB — PROTEIN ELECTROPHORESIS, SERUM
A/G RATIO SPE: 0.9 (ref 0.7–1.7)
ALPHA 2: 1 g/dL (ref 0.4–1.0)
Albumin: 3.4 g/dL (ref 2.9–4.4)
Alpha 1: 0.2 g/dL (ref 0.0–0.4)
BETA: 0.9 g/dL (ref 0.7–1.3)
GAMMA GLOBULIN: 1.6 g/dL (ref 0.4–1.8)
GLOBULIN, TOTAL: 3.7 g/dL (ref 2.2–3.9)
M-SPIKE, %: 1 g/dL — AB
Total Protein: 7.1 g/dL (ref 6.0–8.5)

## 2017-02-22 ENCOUNTER — Other Ambulatory Visit: Payer: Medicare Other

## 2017-02-22 ENCOUNTER — Other Ambulatory Visit (HOSPITAL_BASED_OUTPATIENT_CLINIC_OR_DEPARTMENT_OTHER): Payer: Medicare Other

## 2017-02-22 ENCOUNTER — Ambulatory Visit: Payer: Medicare Other

## 2017-02-22 ENCOUNTER — Ambulatory Visit (HOSPITAL_BASED_OUTPATIENT_CLINIC_OR_DEPARTMENT_OTHER): Payer: Medicare Other

## 2017-02-22 VITALS — BP 129/62 | HR 78 | Temp 98.3°F | Resp 20

## 2017-02-22 DIAGNOSIS — Z5112 Encounter for antineoplastic immunotherapy: Secondary | ICD-10-CM

## 2017-02-22 DIAGNOSIS — C9000 Multiple myeloma not having achieved remission: Secondary | ICD-10-CM | POA: Diagnosis not present

## 2017-02-22 DIAGNOSIS — Z5111 Encounter for antineoplastic chemotherapy: Secondary | ICD-10-CM | POA: Diagnosis not present

## 2017-02-22 LAB — CMP (CANCER CENTER ONLY)
ALBUMIN: 3.6 g/dL (ref 3.3–5.5)
ALT(SGPT): 22 U/L (ref 10–47)
AST: 20 U/L (ref 11–38)
Alkaline Phosphatase: 80 U/L (ref 26–84)
BILIRUBIN TOTAL: 0.7 mg/dL (ref 0.20–1.60)
BUN, Bld: 26 mg/dL — ABNORMAL HIGH (ref 7–22)
CO2: 27 meq/L (ref 18–33)
CREATININE: 2.3 mg/dL — AB (ref 0.6–1.2)
Calcium: 9.3 mg/dL (ref 8.0–10.3)
Chloride: 106 mEq/L (ref 98–108)
GLUCOSE: 135 mg/dL — AB (ref 73–118)
Potassium: 4 mEq/L (ref 3.3–4.7)
SODIUM: 143 meq/L (ref 128–145)
Total Protein: 7.5 g/dL (ref 6.4–8.1)

## 2017-02-22 LAB — CBC WITH DIFFERENTIAL (CANCER CENTER ONLY)
BASO#: 0 10*3/uL (ref 0.0–0.2)
BASO%: 0.4 % (ref 0.0–2.0)
EOS ABS: 0.3 10*3/uL (ref 0.0–0.5)
EOS%: 4.8 % (ref 0.0–7.0)
HCT: 35.8 % — ABNORMAL LOW (ref 38.7–49.9)
HGB: 11.9 g/dL — ABNORMAL LOW (ref 13.0–17.1)
LYMPH#: 0.9 10*3/uL (ref 0.9–3.3)
LYMPH%: 16 % (ref 14.0–48.0)
MCH: 25.6 pg — AB (ref 28.0–33.4)
MCHC: 33.2 g/dL (ref 32.0–35.9)
MCV: 77 fL — AB (ref 82–98)
MONO#: 0.6 10*3/uL (ref 0.1–0.9)
MONO%: 11.6 % (ref 0.0–13.0)
NEUT#: 3.7 10*3/uL (ref 1.5–6.5)
NEUT%: 67.2 % (ref 40.0–80.0)
PLATELETS: 173 10*3/uL (ref 145–400)
RBC: 4.65 10*6/uL (ref 4.20–5.70)
RDW: 20.5 % — AB (ref 11.1–15.7)
WBC: 5.5 10*3/uL (ref 4.0–10.0)

## 2017-02-22 MED ORDER — PALONOSETRON HCL INJECTION 0.25 MG/5ML
INTRAVENOUS | Status: AC
  Filled 2017-02-22: qty 5

## 2017-02-22 MED ORDER — SODIUM CHLORIDE 0.9 % IV SOLN
300.0000 mg/m2 | Freq: Once | INTRAVENOUS | Status: AC
  Administered 2017-02-22: 620 mg via INTRAVENOUS
  Filled 2017-02-22: qty 31

## 2017-02-22 MED ORDER — HYDROMORPHONE HCL 1 MG/ML IJ SOLN
2.0000 mg | Freq: Once | INTRAMUSCULAR | Status: AC
  Administered 2017-02-22: 2 mg via INTRAVENOUS

## 2017-02-22 MED ORDER — HEPARIN SOD (PORK) LOCK FLUSH 100 UNIT/ML IV SOLN
500.0000 [IU] | Freq: Once | INTRAVENOUS | Status: AC | PRN
  Administered 2017-02-22: 500 [IU]
  Filled 2017-02-22: qty 5

## 2017-02-22 MED ORDER — DEXAMETHASONE SODIUM PHOSPHATE 10 MG/ML IJ SOLN
INTRAMUSCULAR | Status: AC
  Filled 2017-02-22: qty 1

## 2017-02-22 MED ORDER — SODIUM CHLORIDE 0.9% FLUSH
10.0000 mL | INTRAVENOUS | Status: DC | PRN
  Administered 2017-02-22: 10 mL
  Filled 2017-02-22: qty 10

## 2017-02-22 MED ORDER — DEXTROSE 5 % IV SOLN
20.0000 mg/m2 | Freq: Once | INTRAVENOUS | Status: AC
  Administered 2017-02-22: 42 mg via INTRAVENOUS
  Filled 2017-02-22: qty 21

## 2017-02-22 MED ORDER — HYDROMORPHONE HCL 1 MG/ML IJ SOLN
INTRAMUSCULAR | Status: AC
  Filled 2017-02-22: qty 2

## 2017-02-22 MED ORDER — PALONOSETRON HCL INJECTION 0.25 MG/5ML
0.2500 mg | Freq: Once | INTRAVENOUS | Status: AC
  Administered 2017-02-22: 0.25 mg via INTRAVENOUS

## 2017-02-22 MED ORDER — DEXAMETHASONE SODIUM PHOSPHATE 10 MG/ML IJ SOLN
10.0000 mg | Freq: Once | INTRAMUSCULAR | Status: AC
  Administered 2017-02-22: 10 mg via INTRAVENOUS

## 2017-02-22 MED ORDER — SODIUM CHLORIDE 0.9 % IV SOLN
Freq: Once | INTRAVENOUS | Status: AC
  Administered 2017-02-22: 09:00:00 via INTRAVENOUS

## 2017-02-22 NOTE — Patient Instructions (Signed)
[facility] Discharge Instructions for Patients Receiving Chemotherapy  Today you received the following chemotherapy agents:  Kyprolis and Cytoxan  To help prevent nausea and vomiting after your treatment, we encourage you to take your nausea medication as ordered per MD.    If you develop nausea and vomiting that is not controlled by your nausea medication, call the clinic.   BELOW ARE SYMPTOMS THAT SHOULD BE REPORTED IMMEDIATELY:  *FEVER GREATER THAN 100.5 F  *CHILLS WITH OR WITHOUT FEVER  NAUSEA AND VOMITING THAT IS NOT CONTROLLED WITH YOUR NAUSEA MEDICATION  *UNUSUAL SHORTNESS OF BREATH  *UNUSUAL BRUISING OR BLEEDING  TENDERNESS IN MOUTH AND THROAT WITH OR WITHOUT PRESENCE OF ULCERS  *URINARY PROBLEMS  *BOWEL PROBLEMS  UNUSUAL RASH Items with * indicate a potential emergency and should be followed up as soon as possible.  Feel free to call the clinic should you have any questions or concerns. The clinic phone number is [phone].  Please show the CHEMO ALERT CARD at check-in to the Emergency Department and triage nurse.   

## 2017-02-22 NOTE — Progress Notes (Signed)
0840-Pt requesting pain medicine for complaints of pain "all over" at a 10/10.  Dr. [name] notified and order received to give pt Dilaudid 2 mg IV now.  0905-Pt states that pain has "decreased" at this time.  OK to treat with creat-2.3 per Dr. [name].

## 2017-03-04 ENCOUNTER — Other Ambulatory Visit: Payer: Self-pay | Admitting: Hematology & Oncology

## 2017-03-04 DIAGNOSIS — C9000 Multiple myeloma not having achieved remission: Secondary | ICD-10-CM

## 2017-03-08 ENCOUNTER — Encounter: Payer: Self-pay | Admitting: Hematology & Oncology

## 2017-03-08 ENCOUNTER — Inpatient Hospital Stay: Payer: Medicare Other | Attending: Hematology & Oncology | Admitting: Hematology & Oncology

## 2017-03-08 ENCOUNTER — Other Ambulatory Visit: Payer: Self-pay

## 2017-03-08 ENCOUNTER — Inpatient Hospital Stay: Payer: Medicare Other

## 2017-03-08 VITALS — BP 144/82 | HR 81 | Temp 98.1°F | Resp 18 | Wt 191.8 lb

## 2017-03-08 DIAGNOSIS — D649 Anemia, unspecified: Secondary | ICD-10-CM | POA: Insufficient documentation

## 2017-03-08 DIAGNOSIS — C9000 Multiple myeloma not having achieved remission: Secondary | ICD-10-CM

## 2017-03-08 DIAGNOSIS — N289 Disorder of kidney and ureter, unspecified: Secondary | ICD-10-CM | POA: Diagnosis not present

## 2017-03-08 DIAGNOSIS — Z5111 Encounter for antineoplastic chemotherapy: Secondary | ICD-10-CM | POA: Diagnosis present

## 2017-03-08 DIAGNOSIS — D509 Iron deficiency anemia, unspecified: Secondary | ICD-10-CM | POA: Diagnosis not present

## 2017-03-08 DIAGNOSIS — Z5112 Encounter for antineoplastic immunotherapy: Secondary | ICD-10-CM | POA: Insufficient documentation

## 2017-03-08 DIAGNOSIS — Z299 Encounter for prophylactic measures, unspecified: Secondary | ICD-10-CM

## 2017-03-08 LAB — CBC WITH DIFFERENTIAL (CANCER CENTER ONLY)
BASOS ABS: 0.1 10*3/uL (ref 0.0–0.1)
Basophils Relative: 1 %
EOS PCT: 5 %
Eosinophils Absolute: 0.3 10*3/uL (ref 0.0–0.5)
HEMATOCRIT: 35.3 % — AB (ref 38.7–49.9)
Hemoglobin: 11.7 g/dL — ABNORMAL LOW (ref 13.0–17.1)
LYMPHS ABS: 1 10*3/uL (ref 0.9–3.3)
Lymphocytes Relative: 20 %
MCH: 25.5 pg — AB (ref 28.0–33.4)
MCHC: 33.1 g/dL (ref 32.0–35.9)
MCV: 77.1 fL — AB (ref 82.0–98.0)
MONO ABS: 0.6 10*3/uL (ref 0.1–0.9)
MONOS PCT: 13 %
Neutro Abs: 3 10*3/uL (ref 1.5–6.5)
Neutrophils Relative %: 61 %
PLATELETS: 258 10*3/uL (ref 140–400)
RBC: 4.58 MIL/uL (ref 4.20–5.70)
RDW: 18.1 % — AB (ref 11.1–15.7)
WBC Count: 4.9 10*3/uL (ref 4.0–10.3)

## 2017-03-08 LAB — CMP (CANCER CENTER ONLY)
ALT: 23 U/L (ref 0–55)
AST: 20 U/L (ref 5–34)
Albumin: 3.7 g/dL (ref 3.5–5.0)
Alkaline Phosphatase: 67 U/L (ref 26–84)
Anion gap: 12 (ref 5–15)
BUN: 29 mg/dL — AB (ref 7–22)
CO2: 30 mmol/L (ref 18–33)
Calcium: 9.3 mg/dL (ref 8.0–10.3)
Chloride: 97 mmol/L — ABNORMAL LOW (ref 98–108)
Creatinine: 2.2 mg/dL — ABNORMAL HIGH (ref 0.70–1.30)
GLUCOSE: 131 mg/dL — AB (ref 70–118)
Potassium: 4.3 mmol/L (ref 3.3–4.7)
SODIUM: 139 mmol/L (ref 128–145)
TOTAL PROTEIN: 7.6 g/dL (ref 6.4–8.1)
Total Bilirubin: 0.5 mg/dL (ref 0.2–1.2)

## 2017-03-08 MED ORDER — HYDROMORPHONE HCL 4 MG/ML IJ SOLN
INTRAMUSCULAR | Status: AC
  Filled 2017-03-08: qty 1

## 2017-03-08 MED ORDER — DEXAMETHASONE SODIUM PHOSPHATE 10 MG/ML IJ SOLN
INTRAMUSCULAR | Status: AC
  Filled 2017-03-08: qty 1

## 2017-03-08 MED ORDER — PALONOSETRON HCL INJECTION 0.25 MG/5ML
INTRAVENOUS | Status: AC
  Filled 2017-03-08: qty 5

## 2017-03-08 MED ORDER — PALONOSETRON HCL INJECTION 0.25 MG/5ML
0.2500 mg | Freq: Once | INTRAVENOUS | Status: AC
  Administered 2017-03-08: 0.25 mg via INTRAVENOUS

## 2017-03-08 MED ORDER — DEXTROSE 5 % IV SOLN
27.0000 mg/m2 | Freq: Once | INTRAVENOUS | Status: AC
  Administered 2017-03-08: 56 mg via INTRAVENOUS
  Filled 2017-03-08: qty 28

## 2017-03-08 MED ORDER — SODIUM CHLORIDE 0.9 % IV SOLN
Freq: Once | INTRAVENOUS | Status: AC
  Administered 2017-03-08: 09:00:00 via INTRAVENOUS

## 2017-03-08 MED ORDER — SODIUM CHLORIDE 0.9% FLUSH
10.0000 mL | INTRAVENOUS | Status: DC | PRN
  Administered 2017-03-08: 10 mL
  Filled 2017-03-08: qty 10

## 2017-03-08 MED ORDER — DEXAMETHASONE SODIUM PHOSPHATE 10 MG/ML IJ SOLN
10.0000 mg | Freq: Once | INTRAMUSCULAR | Status: AC
  Administered 2017-03-08: 10 mg via INTRAVENOUS

## 2017-03-08 MED ORDER — HYDROMORPHONE HCL 4 MG/ML IJ SOLN
2.0000 mg | Freq: Once | INTRAMUSCULAR | Status: AC
  Administered 2017-03-08: 2 mg via INTRAVENOUS

## 2017-03-08 MED ORDER — SODIUM CHLORIDE 0.9 % IJ SOLN
10.0000 mL | Freq: Once | INTRAMUSCULAR | Status: AC
  Administered 2017-03-08: 10 mL
  Filled 2017-03-08: qty 10

## 2017-03-08 MED ORDER — SODIUM CHLORIDE 0.9 % IV SOLN
60.0000 mg | Freq: Once | INTRAVENOUS | Status: DC
  Filled 2017-03-08: qty 20

## 2017-03-08 MED ORDER — SODIUM CHLORIDE 0.9 % IV SOLN
60.0000 mg | Freq: Once | INTRAVENOUS | Status: AC
  Administered 2017-03-08: 60 mg via INTRAVENOUS
  Filled 2017-03-08: qty 10

## 2017-03-08 MED ORDER — SODIUM CHLORIDE 0.9 % IV SOLN
300.0000 mg/m2 | Freq: Once | INTRAVENOUS | Status: AC
  Administered 2017-03-08: 620 mg via INTRAVENOUS
  Filled 2017-03-08: qty 31

## 2017-03-08 MED ORDER — HEPARIN SOD (PORK) LOCK FLUSH 100 UNIT/ML IV SOLN
500.0000 [IU] | Freq: Once | INTRAVENOUS | Status: AC | PRN
  Administered 2017-03-08: 500 [IU]
  Filled 2017-03-08: qty 5

## 2017-03-08 NOTE — Progress Notes (Signed)
Hematology and Oncology Follow Up Visit  [name] [id number] May 13, 1950 67 y.o. 03/08/2017   Principle Diagnosis:  IgG kappa myeloma Anemia secondary to renal insufficiency Intermittent iron-deficiency anemia Hypotestosteronemia  Current Therapy:   Aredia 60 mg IV q 2 months  Aranesp 300 mcg subcu as needed for hemoglobin less than 11 Depo-Testosterone 300 mg q 4 weeks IV iron as indicated Kyprolis/Cytoxan - s/p cycle #2   Interim History:  [name] is here today for follow-up and treatment.  He actually is feeling pretty well.  I think the treatments are helping.  He is not having as much pain.  His myeloma studies have been doing pretty well.  His M spike 3 weeks ago was 1.0 g/dL.  His IgG level was 1512 mg/dL.  His Kappa Lightchain was 3 mg/dL.  He had does have diabetes.  He is trying to watch his blood sugars.  He got through the holidays without too much difficulty.  He has had no issues with nausea or vomiting.  He has had no problems with bowels or bladder.  He has had no rashes.  He has had no cough or shortness of breath.  Overall, his performance status is ECOG 1.     Medications:  Allergies as of 03/08/2017      Reactions   Contrast Media [iodinated Diagnostic Agents]    Patient states he was instructed not to take IV contrast.  In 2008 he had an unknown reaction, and was told not to take it again.  He was also told not to take it due to his kidneys.   Iodine Anxiety, Rash, Other (See Comments)   Didn't feel right "instructed not to take per MD--something with his port" Didn't feel right      Medication List        Accurate as of 03/08/17  8:35 AM. Always use your most recent med list.          amLODipine 10 MG tablet Commonly known as:  NORVASC Take 10 mg by mouth daily.   CARAFATE 1 GM/10ML suspension Generic drug:  sucralfate Take 1 g by mouth 4 (four) times daily.   CLEAR EYES ALL SEASONS 5-6 MG/ML Soln Generic drug:  Polyvinyl  Alcohol-Povidone Place 2 drops into both eyes 3 (three) times daily as needed (drye eyes.).   dicyclomine 10 MG capsule Commonly known as:  BENTYL Take 1 capsule (10 mg total) by mouth 3 (three) times daily.   EMBEDA 20-0.8 MG Cpcr Generic drug:  Morphine-Naltrexone Take 1 capsule by mouth 3 (three) times daily.   EMBEDA 30-1.2 MG Cpcr Generic drug:  Morphine-Naltrexone Take 3 (three) times daily by mouth.   furosemide 40 MG tablet Commonly known as:  LASIX Take 40 mg by mouth.   glipiZIDE 5 MG tablet Commonly known as:  GLUCOTROL Take 5 mg by mouth 2 (two) times daily before a meal.   isosorbide mononitrate 30 MG 24 hr tablet Commonly known as:  IMDUR Take 30 mg by mouth daily.   latanoprost 0.005 % ophthalmic solution Commonly known as:  XALATAN Place 1 drop into the right eye every evening.   lidocaine-prilocaine cream Commonly known as:  EMLA Apply 1 application topically as needed.   linaclotide 145 MCG Caps capsule Commonly known as:  LINZESS Take 1 capsule (145 mcg total) by mouth daily before breakfast.   LORazepam 0.5 MG tablet Commonly known as:  ATIVAN Take 1 tablet (0.5 mg total) by mouth every 6 (six) hours as needed (  Nausea or vomiting).   losartan 25 MG tablet Commonly known as:  COZAAR Take 25 mg by mouth.   meclizine 50 MG tablet Commonly known as:  ANTIVERT Take 0.5 tablets (25 mg total) by mouth 4 (four) times daily as needed for dizziness.   methocarbamol 500 MG tablet Commonly known as:  ROBAXIN Take 500 mg by mouth 4 (four) times daily.   mirabegron ER 50 MG Tb24 tablet Commonly known as:  MYRBETRIQ Take 50 mg by mouth daily.   omeprazole 40 MG capsule Commonly known as:  PRILOSEC take 1 capsule by mouth twice a day   ondansetron 4 MG disintegrating tablet Commonly known as:  ZOFRAN ODT Take 1 tablet (4 mg total) by mouth every 8 (eight) hours as needed for nausea or vomiting.   ondansetron 8 MG tablet Commonly known as:   ZOFRAN Take 1 tablet (8 mg total) 2 (two) times daily as needed by mouth (Nausea or vomiting).   orphenadrine 100 MG tablet Commonly known as:  NORFLEX Take 1 tablet (100 mg total) by mouth 2 (two) times daily.   oxyCODONE 20 mg 12 hr tablet Commonly known as:  OXYCONTIN Take 1 tablet (20 mg total) by mouth every 12 (twelve) hours.   oxyCODONE-acetaminophen 10-325 MG tablet Commonly known as:  PERCOCET Take 1 tablet by mouth every 6 (six) hours as needed for pain.   oxyCODONE-acetaminophen 10-325 MG tablet Commonly known as:  PERCOCET Take 1 tablet by mouth every 6 (six) hours as needed for pain.   potassium chloride SA 20 MEQ tablet Commonly known as:  K-DUR,KLOR-CON Take 1 tablet (20 mEq total) by mouth 2 (two) times daily.   prochlorperazine 10 MG tablet Commonly known as:  COMPAZINE Take 1 tablet (10 mg total) by mouth every 6 (six) hours as needed for nausea or vomiting.   prochlorperazine 10 MG tablet Commonly known as:  COMPAZINE take 1 tablet by mouth every 6 hours if needed for nausea and vomiting   TOPROL XL 50 MG 24 hr tablet Generic drug:  metoprolol succinate Take 50 mg by mouth daily.   Vitamin D (Ergocalciferol) 50000 units Caps capsule Commonly known as:  DRISDOL Take 50,000 Units by mouth every Saturday.   ZIOPTAN 0.0015 % Soln Generic drug:  Tafluprost Place 1 drop into both eyes daily.       Allergies:  Allergies  Allergen Reactions  . Contrast Media [Iodinated Diagnostic Agents]     Patient states he was instructed not to take IV contrast.  In 2008 he had an unknown reaction, and was told not to take it again.  He was also told not to take it due to his kidneys.  . Iodine Anxiety, Rash and Other (See Comments)    Didn't feel right "instructed not to take per MD--something with his port" Didn't feel right    Past Medical History, Surgical history, Social history, and Family History were reviewed and updated.  Review of Systems: As stated  in the interim history.   Physical Exam:  weight is 191 lb 12 oz (87 kg). His oral temperature is 98.1 F (36.7 C). His blood pressure is 144/82 (abnormal) and his pulse is 81. His respiration is 18 and oxygen saturation is 100%.   Wt Readings from Last 3 Encounters:  03/08/17 191 lb 12 oz (87 kg)  02/15/17 189 lb (85.7 kg)  02/08/17 195 lb 1.3 oz (88.5 kg)    Physical Exam  Constitutional: He is oriented to person, place, and time.  HENT:  Head: Normocephalic and atraumatic.  Mouth/Throat: Oropharynx is clear and moist.  Eyes: EOM are normal. Pupils are equal, round, and reactive to light.  Neck: Normal range of motion.  Cardiovascular: Normal rate, regular rhythm and normal heart sounds.  Pulmonary/Chest: Effort normal and breath sounds normal.  Abdominal: Soft. Bowel sounds are normal.  Musculoskeletal: Normal range of motion. He exhibits no edema, tenderness or deformity.  Lymphadenopathy:    He has no cervical adenopathy.  Neurological: He is alert and oriented to person, place, and time.  Skin: Skin is warm and dry. No rash noted. No erythema.  Psychiatric: He has a normal mood and affect. His behavior is normal. Judgment and thought content normal.  Vitals reviewed.    Lab Results  Component Value Date   WBC 5.5 02/22/2017   HGB 11.9 (L) 02/22/2017   HCT 35.8 (L) 02/22/2017   MCV 77 (L) 02/22/2017   PLT 173 02/22/2017   Lab Results  Component Value Date   FERRITIN 989 (H) 02/15/2017   IRON 55 02/15/2017   TIBC 224 02/15/2017   UIBC 169 02/15/2017   IRONPCTSAT 24 02/15/2017   Lab Results  Component Value Date   RETICCTPCT 0.6 03/21/2014   RBC 4.65 02/22/2017   RETICCTABS 27.5 03/21/2014   Lab Results  Component Value Date   KPAFRELGTCHN 5.30 (H) 11/07/2014   LAMBDASER 3.01 (H) 11/07/2014   KAPLAMBRATIO 1.67 (H) 02/15/2017   Lab Results  Component Value Date   IGGSERUM 1,512 02/15/2017   IGA 430 (H) 11/07/2014   IGMSERUM 43 02/15/2017   Lab  Results  Component Value Date   TOTALPROTELP 8.4 (H) 11/07/2014   ALBUMINELP 4.2 11/07/2014   A1GS 0.3 11/07/2014   A2GS 1.0 (H) 11/07/2014   BETS 0.4 11/07/2014   BETA2SER 0.5 11/07/2014   GAMS 2.0 (H) 11/07/2014   MSPIKE 1.0 (H) 02/15/2017   SPEI * 11/07/2014     Chemistry      Component Value Date/Time   NA 143 02/22/2017 0744   K 4.0 02/22/2017 0744   CL 106 02/22/2017 0744   CO2 27 02/22/2017 0744   BUN 26 (H) 02/22/2017 0744   CREATININE 2.3 (H) 02/22/2017 0744      Component Value Date/Time   CALCIUM 9.3 02/22/2017 0744   ALKPHOS 80 02/22/2017 0744   AST 20 02/22/2017 0744   ALT 22 02/22/2017 0744   BILITOT 0.70 02/22/2017 0744      Impression and Plan: Mr. Najjar is a very pleasant 67 yo African American gentleman with IgG kappa myeloma.  He is done very well with the Kyprolis/Cytoxan protocol.  I am increasing the dose of Kyprolis with this cycle.  I will continue to increase the dose gradually until we get to the top dose of 56 mg/m.  His myeloma studies have responded nicely.  He has done well with treatment.  We do have to watch his iron studies.  In late December, his ferritin was 989 with an iron saturation of 24%.  His testosterone level in December was 493.  We will plan to get him back in another month for follow-up and treatment.  Ing progress with his myeloma.  Volanda Napoleon, MD 1/14/20198:35 AM

## 2017-03-08 NOTE — Patient Instructions (Signed)
Implanted Port Home Guide An implanted port is a type of central line that is placed under the skin. Central lines are used to provide IV access when treatment or nutrition needs to be given through a person's veins. Implanted ports are used for long-term IV access. An implanted port may be placed because:  You need IV medicine that would be irritating to the small veins in your hands or arms.  You need long-term IV medicines, such as antibiotics.  You need IV nutrition for a long period.  You need frequent blood draws for lab tests.  You need dialysis.  Implanted ports are usually placed in the chest area, but they can also be placed in the upper arm, the abdomen, or the leg. An implanted port has two main parts:  Reservoir. The reservoir is round and will appear as a small, raised area under your skin. The reservoir is the part where a needle is inserted to give medicines or draw blood.  Catheter. The catheter is a thin, flexible tube that extends from the reservoir. The catheter is placed into a large vein. Medicine that is inserted into the reservoir goes into the catheter and then into the vein.  How will I care for my incision site? Do not get the incision site wet. Bathe or shower as directed by your health care provider. How is my port accessed? Special steps must be taken to access the port:  Before the port is accessed, a numbing cream can be placed on the skin. This helps numb the skin over the port site.  Your health care provider uses a sterile technique to access the port. ? Your health care provider must put on a mask and sterile gloves. ? The skin over your port is cleaned carefully with an antiseptic and allowed to dry. ? The port is gently pinched between sterile gloves, and a needle is inserted into the port.  Only "non-coring" port needles should be used to access the port. Once the port is accessed, a blood return should be checked. This helps ensure that the port  is in the vein and is not clogged.  If your port needs to remain accessed for a constant infusion, a clear (transparent) bandage will be placed over the needle site. The bandage and needle will need to be changed every week, or as directed by your health care provider.  Keep the bandage covering the needle clean and dry. Do not get it wet. Follow your health care provider's instructions on how to take a shower or bath while the port is accessed.  If your port does not need to stay accessed, no bandage is needed over the port.  What is flushing? Flushing helps keep the port from getting clogged. Follow your health care provider's instructions on how and when to flush the port. Ports are usually flushed with saline solution or a medicine called heparin. The need for flushing will depend on how the port is used.  If the port is used for intermittent medicines or blood draws, the port will need to be flushed: ? After medicines have been given. ? After blood has been drawn. ? As part of routine maintenance.  If a constant infusion is running, the port may not need to be flushed.  How long will my port stay implanted? The port can stay in for as long as your health care provider thinks it is needed. When it is time for the port to come out, surgery will be   done to remove it. The procedure is similar to the one performed when the port was put in. When should I seek immediate medical care? When you have an implanted port, you should seek immediate medical care if:  You notice a bad smell coming from the incision site.  You have swelling, redness, or drainage at the incision site.  You have more swelling or pain at the port site or the surrounding area.  You have a fever that is not controlled with medicine.  This information is not intended to replace advice given to you by your health care provider. Make sure you discuss any questions you have with your health care provider. Document  Released: 02/09/2005 Document Revised: 07/18/2015 Document Reviewed: 10/17/2012 Elsevier Interactive Patient Education  2017 [employer]  

## 2017-03-08 NOTE — Progress Notes (Signed)
Ok to treat with creatinine 2.2 per Dr. [name].   Patient complaining of generalized bone pain rating the pain a 10 on the 0 to 10 pain scale. Dr. [name] notified.

## 2017-03-08 NOTE — Patient Instructions (Signed)
[facility] Discharge Instructions for Patients Receiving Chemotherapy  Today you received the following chemotherapy agents Kyprolis/Cytoxan To help prevent nausea and vomiting after your treatment, we encourage you to take your nausea medication as prescribed.   If you develop nausea and vomiting that is not controlled by your nausea medication, call the clinic.   BELOW ARE SYMPTOMS THAT SHOULD BE REPORTED IMMEDIATELY:  *FEVER GREATER THAN 100.5 F  *CHILLS WITH OR WITHOUT FEVER  NAUSEA AND VOMITING THAT IS NOT CONTROLLED WITH YOUR NAUSEA MEDICATION  *UNUSUAL SHORTNESS OF BREATH  *UNUSUAL BRUISING OR BLEEDING  TENDERNESS IN MOUTH AND THROAT WITH OR WITHOUT PRESENCE OF ULCERS  *URINARY PROBLEMS  *BOWEL PROBLEMS  UNUSUAL RASH Items with * indicate a potential emergency and should be followed up as soon as possible.  Feel free to call the clinic should you have any questions or concerns. The clinic phone number is [phone].  Please show the [facility] at check-in to the Emergency Department and triage nurse.    Pamidronate injection (Aredia) What is this medicine? PAMIDRONATE (pa mi DROE nate) slows calcium loss from bones. It is used to treat high calcium blood levels from cancer or Paget's disease. It is also used to treat bone pain and prevent fractures from certain cancers that have spread to the bone. This medicine may be used for other purposes; ask your health care provider or pharmacist if you have questions. COMMON BRAND NAME(S): Aredia What should I tell my health care provider before I take this medicine? They need to know if you have any of these conditions: -aspirin-sensitive asthma -dental disease -kidney disease -an unusual or allergic reaction to pamidronate, other medicines, foods, dyes, or preservatives -pregnant or trying to get pregnant -breast-feeding How should I use this medicine? This medicine is for infusion into a vein.  It is given by a health care professional in a hospital or clinic setting. Talk to your pediatrician regarding the use of this medicine in children. This medicine is not approved for use in children. Overdosage: If you think you have taken too much of this medicine contact a poison control center or emergency room at once. NOTE: This medicine is only for you. Do not share this medicine with others. What if I miss a dose? This does not apply. What may interact with this medicine? -certain antibiotics given by injection -medicines for inflammation or pain like ibuprofen, naproxen -some diuretics like bumetanide, furosemide -cyclosporine -parathyroid hormone -tacrolimus -teriparatide -thalidomide This list may not describe all possible interactions. Give your health care provider a list of all the medicines, herbs, non-prescription drugs, or dietary supplements you use. Also tell them if you smoke, drink alcohol, or use illegal drugs. Some items may interact with your medicine. What should I watch for while using this medicine? Visit your doctor or health care professional for regular checkups. It may be some time before you see the benefit from this medicine. Do not stop taking your medicine unless your doctor tells you to. Your doctor may order blood tests or other tests to see how you are doing. Women should inform their doctor if they wish to become pregnant or think they might be pregnant. There is a potential for serious side effects to an unborn child. Talk to your health care professional or pharmacist for more information. You should make sure that you get enough calcium and vitamin D while you are taking this medicine. Discuss the foods you eat and the vitamins you  take with your health care professional. Some people who take this medicine have severe bone, joint, and/or muscle pain. This medicine may also increase your risk for a broken thigh bone. Tell your doctor right away if you have  pain in your upper leg or groin. Tell your doctor if you have any pain that does not go away or that gets worse. What side effects may I notice from receiving this medicine? Side effects that you should report to your doctor or health care professional as soon as possible: -allergic reactions like skin rash, itching or hives, swelling of the face, lips, or tongue -black or tarry stools -changes in vision -eye inflammation, pain -high blood pressure -jaw pain, especially burning or cramping -muscle weakness -numb, tingling pain -swelling of feet or hands -trouble passing urine or change in the amount of urine -unable to move easily Side effects that usually do not require medical attention (report to your doctor or health care professional if they continue or are bothersome): -bone, joint, or muscle pain -constipation -dizzy, drowsy -fever -headache -loss of appetite -nausea, vomiting -pain at site where injected This list may not describe all possible side effects. Call your doctor for medical advice about side effects. You may report side effects to FDA at 1-800-FDA-1088. Where should I keep my medicine? This drug is given in a hospital or clinic and will not be stored at home. NOTE: This sheet is a summary. It may not cover all possible information. If you have questions about this medicine, talk to your doctor, pharmacist, or health care provider.  2018 Elsevier/Gold Standard (2010-08-08 08:49:49)

## 2017-03-09 LAB — IGG, IGA, IGM
IgA: 242 mg/dL (ref 61–437)
IgG (Immunoglobin G), Serum: 1616 mg/dL — ABNORMAL HIGH (ref 700–1600)
IgM (Immunoglobulin M), Srm: 37 mg/dL (ref 20–172)

## 2017-03-09 LAB — KAPPA/LAMBDA LIGHT CHAINS
KAPPA FREE LGHT CHN: 43.4 mg/L — AB (ref 3.3–19.4)
Kappa, lambda light chain ratio: 1.14 (ref 0.26–1.65)
Lambda free light chains: 38 mg/L — ABNORMAL HIGH (ref 5.7–26.3)

## 2017-03-12 ENCOUNTER — Other Ambulatory Visit: Payer: Self-pay | Admitting: *Deleted

## 2017-03-12 DIAGNOSIS — C9000 Multiple myeloma not having achieved remission: Secondary | ICD-10-CM

## 2017-03-15 ENCOUNTER — Inpatient Hospital Stay: Payer: Medicare Other

## 2017-03-15 ENCOUNTER — Telehealth: Payer: Self-pay | Admitting: *Deleted

## 2017-03-15 DIAGNOSIS — Z5112 Encounter for antineoplastic immunotherapy: Secondary | ICD-10-CM | POA: Diagnosis not present

## 2017-03-15 DIAGNOSIS — C9000 Multiple myeloma not having achieved remission: Secondary | ICD-10-CM

## 2017-03-15 LAB — CBC WITH DIFFERENTIAL (CANCER CENTER ONLY)
Basophils Absolute: 0 10*3/uL (ref 0.0–0.1)
Basophils Relative: 1 %
Eosinophils Absolute: 0.2 10*3/uL (ref 0.0–0.5)
Eosinophils Relative: 4 %
HCT: 33.5 % — ABNORMAL LOW (ref 38.7–49.9)
HEMOGLOBIN: 11.2 g/dL — AB (ref 13.0–17.1)
LYMPHS ABS: 1.1 10*3/uL (ref 0.9–3.3)
LYMPHS PCT: 23 %
MCH: 25.5 pg — ABNORMAL LOW (ref 28.0–33.4)
MCHC: 33.4 g/dL (ref 32.0–35.9)
MCV: 76.1 fL — AB (ref 82.0–98.0)
Monocytes Absolute: 0.6 10*3/uL (ref 0.1–0.9)
Monocytes Relative: 12 %
Neutro Abs: 2.9 10*3/uL (ref 1.5–6.5)
Neutrophils Relative %: 60 %
Platelet Count: 202 10*3/uL (ref 140–400)
RBC: 4.4 MIL/uL (ref 4.20–5.70)
RDW: 17.5 % — ABNORMAL HIGH (ref 11.1–15.7)
WBC: 4.8 10*3/uL (ref 4.0–10.3)

## 2017-03-15 LAB — CMP (CANCER CENTER ONLY)
ALK PHOS: 73 U/L (ref 26–84)
ALT: 18 U/L (ref 0–55)
ANION GAP: 6 (ref 5–15)
AST: 20 U/L (ref 5–34)
Albumin: 3.4 g/dL — ABNORMAL LOW (ref 3.5–5.0)
BILIRUBIN TOTAL: 0.6 mg/dL (ref 0.2–1.2)
BUN: 27 mg/dL — ABNORMAL HIGH (ref 7–22)
CO2: 28 mmol/L (ref 18–33)
Calcium: 9.1 mg/dL (ref 8.0–10.3)
Chloride: 108 mmol/L (ref 98–108)
Creatinine: 1.9 mg/dL — ABNORMAL HIGH (ref 0.70–1.30)
Glucose, Bld: 123 mg/dL — ABNORMAL HIGH (ref 70–118)
POTASSIUM: 3.9 mmol/L (ref 3.3–4.7)
Sodium: 142 mmol/L (ref 128–145)
TOTAL PROTEIN: 7.5 g/dL (ref 6.4–8.1)

## 2017-03-15 MED ORDER — SODIUM CHLORIDE 0.9% FLUSH
10.0000 mL | INTRAVENOUS | Status: DC | PRN
  Administered 2017-03-15: 10 mL
  Filled 2017-03-15: qty 10

## 2017-03-15 MED ORDER — MORPHINE SULFATE 4 MG/ML IJ SOLN
4.0000 mg | Freq: Once | INTRAMUSCULAR | Status: AC
  Administered 2017-03-15: 4 mg via INTRAVENOUS
  Filled 2017-03-15: qty 1

## 2017-03-15 MED ORDER — DEXAMETHASONE SODIUM PHOSPHATE 10 MG/ML IJ SOLN
10.0000 mg | Freq: Once | INTRAMUSCULAR | Status: AC
  Administered 2017-03-15: 10 mg via INTRAVENOUS

## 2017-03-15 MED ORDER — MORPHINE SULFATE (PF) 4 MG/ML IV SOLN
INTRAVENOUS | Status: AC
  Filled 2017-03-15: qty 1

## 2017-03-15 MED ORDER — PALONOSETRON HCL INJECTION 0.25 MG/5ML
INTRAVENOUS | Status: AC
  Filled 2017-03-15: qty 5

## 2017-03-15 MED ORDER — PALONOSETRON HCL INJECTION 0.25 MG/5ML
0.2500 mg | Freq: Once | INTRAVENOUS | Status: AC
  Administered 2017-03-15: 0.25 mg via INTRAVENOUS

## 2017-03-15 MED ORDER — DEXAMETHASONE SODIUM PHOSPHATE 10 MG/ML IJ SOLN
INTRAMUSCULAR | Status: AC
Start: 2017-03-15 — End: 2017-03-15
  Filled 2017-03-15: qty 1

## 2017-03-15 MED ORDER — SODIUM CHLORIDE 0.9 % IV SOLN
Freq: Once | INTRAVENOUS | Status: AC
  Administered 2017-03-15: 09:00:00 via INTRAVENOUS

## 2017-03-15 MED ORDER — HEPARIN SOD (PORK) LOCK FLUSH 100 UNIT/ML IV SOLN
500.0000 [IU] | Freq: Once | INTRAVENOUS | Status: AC | PRN
  Administered 2017-03-15: 500 [IU]
  Filled 2017-03-15: qty 5

## 2017-03-15 MED ORDER — CYCLOPHOSPHAMIDE CHEMO INJECTION 1 GM
300.0000 mg/m2 | Freq: Once | INTRAMUSCULAR | Status: AC
  Administered 2017-03-15: 620 mg via INTRAVENOUS
  Filled 2017-03-15: qty 31

## 2017-03-15 MED ORDER — CARFILZOMIB CHEMO INJECTION 60 MG
27.0000 mg/m2 | Freq: Once | INTRAVENOUS | Status: AC
  Administered 2017-03-15: 56 mg via INTRAVENOUS
  Filled 2017-03-15: qty 28

## 2017-03-15 NOTE — Patient Instructions (Signed)
Cyclophosphamide injection What is this medicine? CYCLOPHOSPHAMIDE (sye kloe FOSS fa mide) is a chemotherapy drug. It slows the growth of cancer cells. This medicine is used to treat many types of cancer like lymphoma, myeloma, leukemia, breast cancer, and ovarian cancer, to name a few. This medicine may be used for other purposes; ask your health care provider or pharmacist if you have questions. COMMON BRAND NAME(S): Cytoxan, Neosar What should I tell my health care provider before I take this medicine? They need to know if you have any of these conditions: -blood disorders -history of other chemotherapy -infection -kidney disease -liver disease -recent or ongoing radiation therapy -tumors in the bone marrow -an unusual or allergic reaction to cyclophosphamide, other chemotherapy, other medicines, foods, dyes, or preservatives -pregnant or trying to get pregnant -breast-feeding How should I use this medicine? This drug is usually given as an injection into a vein or muscle or by infusion into a vein. It is administered in a hospital or clinic by a specially trained health care professional. Talk to your pediatrician regarding the use of this medicine in children. Special care may be needed. Overdosage: If you think you have taken too much of this medicine contact a poison control center or emergency room at once. NOTE: This medicine is only for you. Do not share this medicine with others. What if I miss a dose? It is important not to miss your dose. Call your doctor or health care professional if you are unable to keep an appointment. What may interact with this medicine? This medicine may interact with the following medications: -amiodarone -amphotericin B -azathioprine -certain antiviral medicines for HIV or AIDS such as protease inhibitors (e.g., indinavir, ritonavir) and zidovudine -certain blood pressure medications such as benazepril, captopril, enalapril, fosinopril,  lisinopril, moexipril, monopril, perindopril, quinapril, ramipril, trandolapril -certain cancer medications such as anthracyclines (e.g., daunorubicin, doxorubicin), busulfan, cytarabine, paclitaxel, pentostatin, tamoxifen, trastuzumab -certain diuretics such as chlorothiazide, chlorthalidone, hydrochlorothiazide, indapamide, metolazone -certain medicines that treat or prevent blood clots like warfarin -certain muscle relaxants such as succinylcholine -cyclosporine -etanercept -indomethacin -medicines to increase blood counts like filgrastim, pegfilgrastim, sargramostim -medicines used as general anesthesia -metronidazole -natalizumab This list may not describe all possible interactions. Give your health care provider a list of all the medicines, herbs, non-prescription drugs, or dietary supplements you use. Also tell them if you smoke, drink alcohol, or use illegal drugs. Some items may interact with your medicine. What should I watch for while using this medicine? Visit your doctor for checks on your progress. This drug may make you feel generally unwell. This is not uncommon, as chemotherapy can affect healthy cells as well as cancer cells. Report any side effects. Continue your course of treatment even though you feel ill unless your doctor tells you to stop. Drink water or other fluids as directed. Urinate often, even at night. In some cases, you may be given additional medicines to help with side effects. Follow all directions for their use. Call your doctor or health care professional for advice if you get a fever, chills or sore throat, or other symptoms of a cold or flu. Do not treat yourself. This drug decreases your body's ability to fight infections. Try to avoid being around people who are sick. This medicine may increase your risk to bruise or bleed. Call your doctor or health care professional if you notice any unusual bleeding. Be careful brushing and flossing your teeth or using a  toothpick because you may get an infection or bleed   more easily. If you have any dental work done, tell your dentist you are receiving this medicine. You may get drowsy or dizzy. Do not drive, use machinery, or do anything that needs mental alertness until you know how this medicine affects you. Do not become pregnant while taking this medicine or for 1 year after stopping it. Women should inform their doctor if they wish to become pregnant or think they might be pregnant. Men should not father a child while taking this medicine and for 4 months after stopping it. There is a potential for serious side effects to an unborn child. Talk to your health care professional or pharmacist for more information. Do not breast-feed an infant while taking this medicine. This medicine may interfere with the ability to have a child. This medicine has caused ovarian failure in some women. This medicine has caused reduced sperm counts in some men. You should talk with your doctor or health care professional if you are concerned about your fertility. If you are going to have surgery, tell your doctor or health care professional that you have taken this medicine. What side effects may I notice from receiving this medicine? Side effects that you should report to your doctor or health care professional as soon as possible: -allergic reactions like skin rash, itching or hives, swelling of the face, lips, or tongue -low blood counts - this medicine may decrease the number of white blood cells, red blood cells and platelets. You may be at increased risk for infections and bleeding. -signs of infection - fever or chills, cough, sore throat, pain or difficulty passing urine -signs of decreased platelets or bleeding - bruising, pinpoint red spots on the skin, black, tarry stools, blood in the urine -signs of decreased red blood cells - unusually weak or tired, fainting spells, lightheadedness -breathing problems -dark  urine -dizziness -palpitations -swelling of the ankles, feet, hands -trouble passing urine or change in the amount of urine -weight gain -yellowing of the eyes or skin Side effects that usually do not require medical attention (report to your doctor or health care professional if they continue or are bothersome): -changes in nail or skin color -hair loss -missed menstrual periods -mouth sores -nausea, vomiting This list may not describe all possible side effects. Call your doctor for medical advice about side effects. You may report side effects to FDA at 1-800-FDA-1088. Where should I keep my medicine? This drug is given in a hospital or clinic and will not be stored at home. NOTE: This sheet is a summary. It may not cover all possible information. If you have questions about this medicine, talk to your doctor, pharmacist, or health care provider.  2018 Elsevier/Gold Standard (2011-12-25 16:22:58) Carfilzomib injection What is this medicine? CARFILZOMIB (kar FILZ oh mib) targets a specific protein within cancer cells and stops the cancer cells from growing. It is used to treat multiple myeloma. This medicine may be used for other purposes; ask your health care provider or pharmacist if you have questions. COMMON BRAND NAME(S): KYPROLIS What should I tell my health care provider before I take this medicine? They need to know if you have any of these conditions: -heart disease -history of blood clots -irregular heartbeat -kidney disease -liver disease -lung or breathing disease -an unusual or allergic reaction to carfilzomib, or other medicines, foods, dyes, or preservatives -pregnant or trying to get pregnant -breast-feeding How should I use this medicine? This medicine is for injection or infusion into a vein. It is given  by a health care professional in a hospital or clinic setting. Talk to your pediatrician regarding the use of this medicine in children. Special care may be  needed. Overdosage: If you think you have taken too much of this medicine contact a poison control center or emergency room at once. NOTE: This medicine is only for you. Do not share this medicine with others. What if I miss a dose? It is important not to miss your dose. Call your doctor or health care professional if you are unable to keep an appointment. What may interact with this medicine? Interactions are not expected. Give your health care provider a list of all the medicines, herbs, non-prescription drugs, or dietary supplements you use. Also tell them if you smoke, drink alcohol, or use illegal drugs. Some items may interact with your medicine. This list may not describe all possible interactions. Give your health care provider a list of all the medicines, herbs, non-prescription drugs, or dietary supplements you use. Also tell them if you smoke, drink alcohol, or use illegal drugs. Some items may interact with your medicine. What should I watch for while using this medicine? Your condition will be monitored carefully while you are receiving this medicine. Report any side effects. Continue your course of treatment even though you feel ill unless your doctor tells you to stop. You may need blood work done while you are taking this medicine. Do not become pregnant while taking this medicine or for at least 30 days after stopping it. Women should inform their doctor if they wish to become pregnant or think they might be pregnant. There is a potential for serious side effects to an unborn child. Men should not father a child while taking this medicine and for 90 days after stopping it. Talk to your health care professional or pharmacist for more information. Do not breast-feed an infant while taking this medicine. Check with your doctor or health care professional if you get an attack of severe diarrhea, nausea and vomiting, or if you sweat a lot. The loss of too much body fluid can make it  dangerous for you to take this medicine. You may get dizzy. Do not drive, use machinery, or do anything that needs mental alertness until you know how this medicine affects you. Do not stand or sit up quickly, especially if you are an older patient. This reduces the risk of dizzy or fainting spells. What side effects may I notice from receiving this medicine? Side effects that you should report to your doctor or health care professional as soon as possible: -allergic reactions like skin rash, itching or hives, swelling of the face, lips, or tongue -confusion -dizziness -feeling faint or lightheaded -fever or chills -palpitations -seizures -signs and symptoms of bleeding such as bloody or black, tarry stools; red or dark-brown urine; spitting up blood or brown material that looks like coffee grounds; red spots on the skin; unusual bruising or bleeding including from the eye, gums, or nose -signs and symptoms of a blood clot such as breathing problems; changes in vision; chest pain; severe, sudden headache; pain, swelling, warmth in the leg; trouble speaking; sudden numbness or weakness of the face, arm or leg -signs and symptoms of kidney injury like trouble passing urine or change in the amount of urine -signs and symptoms of liver injury like dark yellow or brown urine; general ill feeling or flu-like symptoms; light-colored stools; loss of appetite; nausea; right upper belly pain; unusually weak or tired; yellowing of the   eyes or skin Side effects that usually do not require medical attention (report to your doctor or health care professional if they continue or are bothersome): -back pain -cough -diarrhea -headache -muscle cramps -vomiting This list may not describe all possible side effects. Call your doctor for medical advice about side effects. You may report side effects to FDA at 1-800-FDA-1088. Where should I keep my medicine? This drug is given in a hospital or clinic and will not  be stored at home. NOTE: This sheet is a summary. It may not cover all possible information. If you have questions about this medicine, talk to your doctor, pharmacist, or health care provider.  2018 Elsevier/Gold Standard (2015-03-14 13:39:23)  

## 2017-03-15 NOTE — Telephone Encounter (Signed)
Pain assessed after 4 mg morphine given iv, patient states he still has pain but is going home now and will take home medication

## 2017-03-19 ENCOUNTER — Other Ambulatory Visit: Payer: Self-pay | Admitting: *Deleted

## 2017-03-19 DIAGNOSIS — C9000 Multiple myeloma not having achieved remission: Secondary | ICD-10-CM

## 2017-03-22 ENCOUNTER — Inpatient Hospital Stay: Payer: Medicare Other

## 2017-03-22 ENCOUNTER — Other Ambulatory Visit: Payer: Self-pay

## 2017-03-22 ENCOUNTER — Other Ambulatory Visit: Payer: Self-pay | Admitting: *Deleted

## 2017-03-22 VITALS — BP 140/69 | HR 66 | Temp 97.7°F | Resp 20

## 2017-03-22 DIAGNOSIS — C9000 Multiple myeloma not having achieved remission: Secondary | ICD-10-CM

## 2017-03-22 DIAGNOSIS — Z5112 Encounter for antineoplastic immunotherapy: Secondary | ICD-10-CM | POA: Diagnosis not present

## 2017-03-22 LAB — CBC WITH DIFFERENTIAL (CANCER CENTER ONLY)
Basophils Absolute: 0.1 10*3/uL (ref 0.0–0.1)
Basophils Relative: 1 %
Eosinophils Absolute: 0.3 10*3/uL (ref 0.0–0.5)
Eosinophils Relative: 4 %
HCT: 32.5 % — ABNORMAL LOW (ref 38.7–49.9)
HEMOGLOBIN: 10.9 g/dL — AB (ref 13.0–17.1)
LYMPHS ABS: 0.9 10*3/uL (ref 0.9–3.3)
LYMPHS PCT: 13 %
MCH: 25.6 pg — AB (ref 28.0–33.4)
MCHC: 33.5 g/dL (ref 32.0–35.9)
MCV: 76.3 fL — AB (ref 82.0–98.0)
MONOS PCT: 10 %
Monocytes Absolute: 0.7 10*3/uL (ref 0.1–0.9)
NEUTROS ABS: 4.7 10*3/uL (ref 1.5–6.5)
NEUTROS PCT: 72 %
Platelet Count: 179 10*3/uL (ref 140–400)
RBC: 4.26 MIL/uL (ref 4.20–5.70)
RDW: 17.4 % — ABNORMAL HIGH (ref 11.1–15.7)
WBC Count: 6.6 10*3/uL (ref 4.0–10.3)

## 2017-03-22 LAB — CMP (CANCER CENTER ONLY)
ALBUMIN: 3.3 g/dL — AB (ref 3.5–5.0)
ALT: 21 U/L (ref 0–55)
ANION GAP: 10 (ref 5–15)
AST: 19 U/L (ref 5–34)
Alkaline Phosphatase: 79 U/L (ref 26–84)
BILIRUBIN TOTAL: 0.5 mg/dL (ref 0.2–1.2)
BUN: 26 mg/dL — ABNORMAL HIGH (ref 7–22)
CHLORIDE: 107 mmol/L (ref 98–108)
CO2: 26 mmol/L (ref 18–33)
CREATININE: 1.9 mg/dL — AB (ref 0.70–1.30)
Calcium: 8.7 mg/dL (ref 8.0–10.3)
Glucose, Bld: 156 mg/dL — ABNORMAL HIGH (ref 70–118)
POTASSIUM: 4.4 mmol/L (ref 3.3–4.7)
Sodium: 143 mmol/L (ref 128–145)
Total Protein: 7.4 g/dL (ref 6.4–8.1)

## 2017-03-22 MED ORDER — SODIUM CHLORIDE 0.9% FLUSH
10.0000 mL | INTRAVENOUS | Status: DC | PRN
  Administered 2017-03-22: 10 mL
  Filled 2017-03-22: qty 10

## 2017-03-22 MED ORDER — PALONOSETRON HCL INJECTION 0.25 MG/5ML
INTRAVENOUS | Status: AC
  Filled 2017-03-22: qty 5

## 2017-03-22 MED ORDER — PALONOSETRON HCL INJECTION 0.25 MG/5ML
0.2500 mg | Freq: Once | INTRAVENOUS | Status: AC
  Administered 2017-03-22: 0.25 mg via INTRAVENOUS

## 2017-03-22 MED ORDER — HYDROMORPHONE HCL 1 MG/ML IJ SOLN
2.0000 mg | Freq: Once | INTRAMUSCULAR | Status: AC
  Administered 2017-03-22: 2 mg via INTRAVENOUS

## 2017-03-22 MED ORDER — LORAZEPAM 0.5 MG PO TABS: 0.5000 mg | ORAL_TABLET | Freq: Four times a day (QID) | ORAL | 2 refills | Status: DC | PRN

## 2017-03-22 MED ORDER — PROCHLORPERAZINE MALEATE 10 MG PO TABS: ORAL_TABLET | ORAL | 1 refills | Status: DC

## 2017-03-22 MED ORDER — DEXTROSE 5 % IV SOLN
27.0000 mg/m2 | Freq: Once | INTRAVENOUS | Status: AC
  Administered 2017-03-22: 56 mg via INTRAVENOUS
  Filled 2017-03-22: qty 28

## 2017-03-22 MED ORDER — DEXAMETHASONE SODIUM PHOSPHATE 10 MG/ML IJ SOLN
10.0000 mg | Freq: Once | INTRAMUSCULAR | Status: AC
  Administered 2017-03-22: 10 mg via INTRAVENOUS

## 2017-03-22 MED ORDER — HEPARIN SOD (PORK) LOCK FLUSH 100 UNIT/ML IV SOLN
500.0000 [IU] | Freq: Once | INTRAVENOUS | Status: AC | PRN
  Administered 2017-03-22: 500 [IU]
  Filled 2017-03-22: qty 5

## 2017-03-22 MED ORDER — SODIUM CHLORIDE 0.9 % IV SOLN
Freq: Once | INTRAVENOUS | Status: AC
  Administered 2017-03-22: 09:00:00 via INTRAVENOUS

## 2017-03-22 MED ORDER — SODIUM CHLORIDE 0.9 % IV SOLN
300.0000 mg/m2 | Freq: Once | INTRAVENOUS | Status: AC
  Administered 2017-03-22: 620 mg via INTRAVENOUS
  Filled 2017-03-22: qty 31

## 2017-03-22 MED ORDER — DEXAMETHASONE SODIUM PHOSPHATE 10 MG/ML IJ SOLN
INTRAMUSCULAR | Status: AC
  Filled 2017-03-22: qty 1

## 2017-03-22 MED ORDER — HYDROMORPHONE HCL 1 MG/ML IJ SOLN
INTRAMUSCULAR | Status: AC
  Filled 2017-03-22: qty 2

## 2017-03-22 NOTE — Patient Instructions (Signed)
[facility] Discharge Instructions for Patients Receiving Chemotherapy  Today you received the following chemotherapy agents:  Kyprolis and Cytoxan  To help prevent nausea and vomiting after your treatment, we encourage you to take your nausea medication as ordered per MD.    If you develop nausea and vomiting that is not controlled by your nausea medication, call the clinic.   BELOW ARE SYMPTOMS THAT SHOULD BE REPORTED IMMEDIATELY:  *FEVER GREATER THAN 100.5 F  *CHILLS WITH OR WITHOUT FEVER  NAUSEA AND VOMITING THAT IS NOT CONTROLLED WITH YOUR NAUSEA MEDICATION  *UNUSUAL SHORTNESS OF BREATH  *UNUSUAL BRUISING OR BLEEDING  TENDERNESS IN MOUTH AND THROAT WITH OR WITHOUT PRESENCE OF ULCERS  *URINARY PROBLEMS  *BOWEL PROBLEMS  UNUSUAL RASH Items with * indicate a potential emergency and should be followed up as soon as possible.  Feel free to call the clinic should you have any questions or concerns. The clinic phone number is [phone].  Please show the CHEMO ALERT CARD at check-in to the Emergency Department and triage nurse.   

## 2017-04-05 ENCOUNTER — Other Ambulatory Visit: Payer: Self-pay

## 2017-04-05 ENCOUNTER — Inpatient Hospital Stay: Payer: Medicare Other

## 2017-04-05 ENCOUNTER — Inpatient Hospital Stay: Payer: Medicare Other | Attending: Hematology & Oncology | Admitting: Family

## 2017-04-05 DIAGNOSIS — C9000 Multiple myeloma not having achieved remission: Secondary | ICD-10-CM

## 2017-04-05 DIAGNOSIS — G8929 Other chronic pain: Secondary | ICD-10-CM

## 2017-04-05 DIAGNOSIS — D509 Iron deficiency anemia, unspecified: Secondary | ICD-10-CM | POA: Diagnosis not present

## 2017-04-05 DIAGNOSIS — D500 Iron deficiency anemia secondary to blood loss (chronic): Secondary | ICD-10-CM

## 2017-04-05 DIAGNOSIS — M545 Low back pain: Secondary | ICD-10-CM

## 2017-04-05 DIAGNOSIS — M4712 Other spondylosis with myelopathy, cervical region: Secondary | ICD-10-CM

## 2017-04-05 DIAGNOSIS — D631 Anemia in chronic kidney disease: Secondary | ICD-10-CM

## 2017-04-05 DIAGNOSIS — Z5112 Encounter for antineoplastic immunotherapy: Secondary | ICD-10-CM | POA: Insufficient documentation

## 2017-04-05 DIAGNOSIS — N189 Chronic kidney disease, unspecified: Secondary | ICD-10-CM | POA: Diagnosis not present

## 2017-04-05 DIAGNOSIS — Z5111 Encounter for antineoplastic chemotherapy: Secondary | ICD-10-CM | POA: Insufficient documentation

## 2017-04-05 LAB — CMP (CANCER CENTER ONLY)
ALK PHOS: 70 U/L (ref 26–84)
ALT: 20 U/L (ref 0–55)
ANION GAP: 9 (ref 5–15)
AST: 23 U/L (ref 5–34)
Albumin: 3.6 g/dL (ref 3.5–5.0)
BUN: 32 mg/dL — AB (ref 7–22)
CO2: 28 mmol/L (ref 18–33)
Calcium: 9.3 mg/dL (ref 8.0–10.3)
Chloride: 105 mmol/L (ref 98–108)
Creatinine: 2.2 mg/dL — ABNORMAL HIGH (ref 0.70–1.30)
Glucose, Bld: 101 mg/dL (ref 73–118)
POTASSIUM: 3.9 mmol/L (ref 3.3–4.7)
Sodium: 142 mmol/L (ref 128–145)
TOTAL PROTEIN: 7.7 g/dL (ref 6.4–8.1)
Total Bilirubin: 0.5 mg/dL (ref 0.2–1.2)

## 2017-04-05 LAB — CBC WITH DIFFERENTIAL (CANCER CENTER ONLY)
Basophils Absolute: 0.1 10*3/uL (ref 0.0–0.1)
Basophils Relative: 1 %
Eosinophils Absolute: 0.3 10*3/uL (ref 0.0–0.5)
Eosinophils Relative: 5 %
HCT: 30.6 % — ABNORMAL LOW (ref 38.7–49.9)
HEMOGLOBIN: 10.2 g/dL — AB (ref 13.0–17.1)
LYMPHS ABS: 1.1 10*3/uL (ref 0.9–3.3)
LYMPHS PCT: 19 %
MCH: 25.6 pg — AB (ref 28.0–33.4)
MCHC: 33.3 g/dL (ref 32.0–35.9)
MCV: 76.7 fL — AB (ref 82.0–98.0)
Monocytes Absolute: 0.7 10*3/uL (ref 0.1–0.9)
Monocytes Relative: 12 %
NEUTROS ABS: 3.6 10*3/uL (ref 1.5–6.5)
NEUTROS PCT: 63 %
Platelet Count: 225 10*3/uL (ref 145–400)
RBC: 3.99 MIL/uL — AB (ref 4.20–5.70)
RDW: 16.9 % — ABNORMAL HIGH (ref 11.1–15.7)
WBC: 5.7 10*3/uL (ref 4.0–10.0)

## 2017-04-05 LAB — IRON AND TIBC
Iron: 64 ug/dL (ref 42–163)
SATURATION RATIOS: 29 % — AB (ref 42–163)
TIBC: 221 ug/dL (ref 202–409)
UIBC: 157 ug/dL

## 2017-04-05 LAB — FERRITIN: FERRITIN: 1315 ng/mL — AB (ref 22–316)

## 2017-04-05 MED ORDER — DEXAMETHASONE SODIUM PHOSPHATE 10 MG/ML IJ SOLN
INTRAMUSCULAR | Status: AC
  Filled 2017-04-05: qty 1

## 2017-04-05 MED ORDER — SODIUM CHLORIDE 0.9 % IV SOLN
300.0000 mg/m2 | Freq: Once | INTRAVENOUS | Status: AC
  Administered 2017-04-05: 620 mg via INTRAVENOUS
  Filled 2017-04-05: qty 31

## 2017-04-05 MED ORDER — SODIUM CHLORIDE 0.9 % IV SOLN: Freq: Once | INTRAVENOUS | Status: AC

## 2017-04-05 MED ORDER — PALONOSETRON HCL INJECTION 0.25 MG/5ML
0.2500 mg | Freq: Once | INTRAVENOUS | Status: AC
  Administered 2017-04-05: 0.25 mg via INTRAVENOUS

## 2017-04-05 MED ORDER — PALONOSETRON HCL INJECTION 0.25 MG/5ML
INTRAVENOUS | Status: AC
  Filled 2017-04-05: qty 5

## 2017-04-05 MED ORDER — DEXAMETHASONE SODIUM PHOSPHATE 10 MG/ML IJ SOLN
10.0000 mg | Freq: Once | INTRAMUSCULAR | Status: AC
  Administered 2017-04-05: 10 mg via INTRAVENOUS

## 2017-04-05 MED ORDER — HYDROMORPHONE HCL 4 MG/ML IJ SOLN
INTRAMUSCULAR | Status: AC
  Filled 2017-04-05: qty 1

## 2017-04-05 MED ORDER — HYDROMORPHONE HCL 4 MG/ML IJ SOLN
2.0000 mg | Freq: Once | INTRAMUSCULAR | Status: AC
  Administered 2017-04-05: 2 mg via INTRAVENOUS

## 2017-04-05 MED ORDER — HEPARIN SOD (PORK) LOCK FLUSH 100 UNIT/ML IV SOLN
500.0000 [IU] | Freq: Once | INTRAVENOUS | Status: AC | PRN
  Administered 2017-04-05: 500 [IU]
  Filled 2017-04-05: qty 5

## 2017-04-05 MED ORDER — DEXTROSE 5 % IV SOLN
27.0000 mg/m2 | Freq: Once | INTRAVENOUS | Status: AC
  Administered 2017-04-05: 56 mg via INTRAVENOUS
  Filled 2017-04-05: qty 28

## 2017-04-05 MED ORDER — SODIUM CHLORIDE 0.9% FLUSH
10.0000 mL | INTRAVENOUS | Status: DC | PRN
  Administered 2017-04-05: 10 mL
  Filled 2017-04-05: qty 10

## 2017-04-05 MED ORDER — SODIUM CHLORIDE 0.9 % IV SOLN
Freq: Once | INTRAVENOUS | Status: AC
  Administered 2017-04-05: 09:00:00 via INTRAVENOUS

## 2017-04-05 NOTE — Patient Instructions (Signed)

## 2017-04-05 NOTE — Progress Notes (Signed)
OK to treat with today's lab values per Dr. Ennever. 

## 2017-04-05 NOTE — Patient Instructions (Signed)
[facility] Discharge Instructions for Patients Receiving Chemotherapy  Today you received the following chemotherapy agents Cytoxan, Kyprolis  To help prevent nausea and vomiting after your treatment, we encourage you to take your nausea medication    If you develop nausea and vomiting that is not controlled by your nausea medication, call the clinic.   BELOW ARE SYMPTOMS THAT SHOULD BE REPORTED IMMEDIATELY:  *FEVER GREATER THAN 100.5 F  *CHILLS WITH OR WITHOUT FEVER  NAUSEA AND VOMITING THAT IS NOT CONTROLLED WITH YOUR NAUSEA MEDICATION  *UNUSUAL SHORTNESS OF BREATH  *UNUSUAL BRUISING OR BLEEDING  TENDERNESS IN MOUTH AND THROAT WITH OR WITHOUT PRESENCE OF ULCERS  *URINARY PROBLEMS  *BOWEL PROBLEMS  UNUSUAL RASH Items with * indicate a potential emergency and should be followed up as soon as possible.  Feel free to call the clinic should you have any questions or concerns. The clinic phone number is [phone].  Please show the CHEMO ALERT CARD at check-in to the Emergency Department and triage nurse.   

## 2017-04-05 NOTE — Progress Notes (Signed)
Hematology and Oncology Follow Up Visit  [name] [id number] [birth year] 67 y.o. 04/05/2017   Principle Diagnosis:  IgG kappa myeloma Anemia secondary to renal insufficiency Intermittent iron - deficiency anemia Hypotestosteronemia  Current Therapy:   Aredia 60 mg IV q 2 months  Aranesp 300 mcg subcu as needed for hemoglobin less than 11 Depo-Testosterone 300 mg q 4 weeks IV iron as indicated Kyprolis/Cytoxan - s/p cycle 3   Interim History:  [name] is here today for follow-up and treatment. He is having some lower back pain that has waxes and wanes.  He has had a few episodes where he was sick to his stomach with nausea. No vomiting or GI symptoms.  His M-spike is 1.0 g/dL, 1,616 mg/dL and kappa light chain 4.34 mg/dL. Testosterone was 493.  No fever, chills, cough, rash, dizziness, SOB, chest pain, palpitations, abdominal pain or changes in bowel or bladder habits.  He takes a stool softener as needed for constipation.  He has occasional episodes of getting hot and sweaty at night.  No swelling in his extremities at this time. The numbness and tingling in his hands and feet is unchanged.  He has maintained a good appetite and is staying well hydrated. His weight is stable.   ECOG Performance Status: 1 - Symptomatic but completely ambulatory  Medications:  Allergies as of 04/05/2017      Reactions   Contrast Media [iodinated Diagnostic Agents]    Patient states he was instructed not to take IV contrast.  In 2008 he had an unknown reaction, and was told not to take it again.  He was also told not to take it due to his kidneys.   Iodine Anxiety, Rash, Other (See Comments)   Didn't feel right "instructed not to take per MD--something with his port" Didn't feel right      Medication List        Accurate as of 04/05/17 11:40 AM. Always use your most recent med list.          amLODipine 10 MG tablet Commonly known as:  NORVASC Take 10 mg by mouth daily.     CARAFATE 1 GM/10ML suspension Generic drug:  sucralfate Take 1 g by mouth 4 (four) times daily.   CLEAR EYES ALL SEASONS 5-6 MG/ML Soln Generic drug:  Polyvinyl Alcohol-Povidone Place 2 drops into both eyes 3 (three) times daily as needed (drye eyes.).   dicyclomine 10 MG capsule Commonly known as:  BENTYL Take 1 capsule (10 mg total) by mouth 3 (three) times daily.   EMBEDA 20-0.8 MG Cpcr Generic drug:  Morphine-Naltrexone Take 1 capsule by mouth 3 (three) times daily.   EMBEDA 30-1.2 MG Cpcr Generic drug:  Morphine-Naltrexone Take 3 (three) times daily by mouth.   furosemide 40 MG tablet Commonly known as:  LASIX Take 40 mg by mouth.   glipiZIDE 5 MG tablet Commonly known as:  GLUCOTROL Take 5 mg by mouth 2 (two) times daily before a meal.   isosorbide mononitrate 30 MG 24 hr tablet Commonly known as:  IMDUR Take 30 mg by mouth daily.   latanoprost 0.005 % ophthalmic solution Commonly known as:  XALATAN Place 1 drop into the right eye every evening.   lidocaine-prilocaine cream Commonly known as:  EMLA Apply 1 application topically as needed.   linaclotide 145 MCG Caps capsule Commonly known as:  LINZESS Take 1 capsule (145 mcg total) by mouth daily before breakfast.   LORazepam 0.5 MG tablet Commonly known as:  ATIVAN  Take 1 tablet (0.5 mg total) by mouth every 6 (six) hours as needed (Nausea or vomiting).   losartan 25 MG tablet Commonly known as:  COZAAR Take 25 mg by mouth.   meclizine 50 MG tablet Commonly known as:  ANTIVERT Take 0.5 tablets (25 mg total) by mouth 4 (four) times daily as needed for dizziness.   methocarbamol 500 MG tablet Commonly known as:  ROBAXIN Take 500 mg by mouth 4 (four) times daily.   mirabegron ER 50 MG Tb24 tablet Commonly known as:  MYRBETRIQ Take 50 mg by mouth daily.   omeprazole 40 MG capsule Commonly known as:  PRILOSEC take 1 capsule by mouth twice a day   ondansetron 4 MG disintegrating tablet Commonly  known as:  ZOFRAN ODT Take 1 tablet (4 mg total) by mouth every 8 (eight) hours as needed for nausea or vomiting.   ondansetron 8 MG tablet Commonly known as:  ZOFRAN Take 1 tablet (8 mg total) 2 (two) times daily as needed by mouth (Nausea or vomiting).   orphenadrine 100 MG tablet Commonly known as:  NORFLEX Take 1 tablet (100 mg total) by mouth 2 (two) times daily.   oxyCODONE 20 mg 12 hr tablet Commonly known as:  OXYCONTIN Take 1 tablet (20 mg total) by mouth every 12 (twelve) hours.   oxyCODONE-acetaminophen 10-325 MG tablet Commonly known as:  PERCOCET Take 1 tablet by mouth every 6 (six) hours as needed for pain.   potassium chloride SA 20 MEQ tablet Commonly known as:  K-DUR,KLOR-CON Take 1 tablet (20 mEq total) by mouth 2 (two) times daily.   prochlorperazine 10 MG tablet Commonly known as:  COMPAZINE take 1 tablet by mouth every 6 hours if needed for nausea and vomiting   TOPROL XL 50 MG 24 hr tablet Generic drug:  metoprolol succinate Take 50 mg by mouth daily.   Vitamin D (Ergocalciferol) 50000 units Caps capsule Commonly known as:  DRISDOL Take 50,000 Units by mouth every Saturday.   ZIOPTAN 0.0015 % Soln Generic drug:  Tafluprost Place 1 drop into both eyes daily.       Allergies:  Allergies  Allergen Reactions  . Contrast Media [Iodinated Diagnostic Agents]     Patient states he was instructed not to take IV contrast.  In 2008 he had an unknown reaction, and was told not to take it again.  He was also told not to take it due to his kidneys.  . Iodine Anxiety, Rash and Other (See Comments)    Didn't feel right "instructed not to take per MD--something with his port" Didn't feel right    Past Medical History, Surgical history, Social history, and Family History were reviewed and updated.  Review of Systems: All other 10 point review of systems is negative.   Physical Exam:  vitals were not taken for this visit.   Wt Readings from Last 3  Encounters:  04/05/17 197 lb (89.4 kg)  03/08/17 191 lb 12 oz (87 kg)  02/15/17 189 lb (85.7 kg)    Ocular: Sclerae unicteric, pupils equal, round and reactive to light Ear-nose-throat: Oropharynx clear, dentition fair Lymphatic: No cervical, supraclavicular or axillary adenopathy Lungs no rales or rhonchi, good excursion bilaterally Heart regular rate and rhythm, no murmur appreciated Abd soft, nontender, positive bowel sounds, no liver or spleen tip palpated on exam, no fluid wave  MSK no focal spinal tenderness, no joint edema Neuro: non-focal, well-oriented, appropriate affect Breasts: Deferred   Lab Results  Component Value Date  WBC 5.7 04/05/2017   HGB 11.9 (L) 02/22/2017   HCT 30.6 (L) 04/05/2017   MCV 76.7 (L) 04/05/2017   PLT 225 04/05/2017   Lab Results  Component Value Date   FERRITIN 1,315 (H) 04/05/2017   IRON 64 04/05/2017   TIBC 221 04/05/2017   UIBC 157 04/05/2017   IRONPCTSAT 29 (L) 04/05/2017   Lab Results  Component Value Date   RETICCTPCT 0.6 03/21/2014   RBC 3.99 (L) 04/05/2017   RETICCTABS 27.5 03/21/2014   Lab Results  Component Value Date   KPAFRELGTCHN 43.4 (H) 03/08/2017   LAMBDASER 38.0 (H) 03/08/2017   KAPLAMBRATIO 1.14 03/08/2017   Lab Results  Component Value Date   IGGSERUM 1,616 (H) 03/08/2017   IGA 242 03/08/2017   IGMSERUM 37 03/08/2017   Lab Results  Component Value Date   TOTALPROTELP 7.1 03/08/2017   ALBUMINELP 3.5 03/08/2017   A1GS 0.2 03/08/2017   A2GS 1.0 03/08/2017   BETS 1.0 03/08/2017   BETA2SER 0.5 11/07/2014   GAMS 1.4 03/08/2017   MSPIKE 1.0 (H) 03/08/2017   SPEI * 11/07/2014     Chemistry      Component Value Date/Time   NA 142 04/05/2017 0800   NA 143 02/22/2017 0744   K 3.9 04/05/2017 0800   K 4.0 02/22/2017 0744   CL 105 04/05/2017 0800   CL 106 02/22/2017 0744   CO2 28 04/05/2017 0800   CO2 27 02/22/2017 0744   BUN 32 (H) 04/05/2017 0800   BUN 26 (H) 02/22/2017 0744   CREATININE 2.20 (H)  04/05/2017 0800   CREATININE 2.3 (H) 02/22/2017 0744      Component Value Date/Time   CALCIUM 9.3 04/05/2017 0800   CALCIUM 9.3 02/22/2017 0744   ALKPHOS 70 04/05/2017 0800   ALKPHOS 80 02/22/2017 0744   AST 23 04/05/2017 0800   ALT 20 04/05/2017 0800   ALT 22 02/22/2017 0744   BILITOT 0.5 04/05/2017 0800      Impression and Plan: [name] is a pleasant 67 yo African American gentleman with IgG kappa myeloma. He is doing well but is having some lower back pain bothering him today. His labs have remained stable so far.  We gave him IV pain medication for his back and he states that he is feeling much better.  His myeloma lab results are pending.  We will proceed with treatment today as planned.  We will see what his iron studies show and bring him back in for infusion if needed.  We will plan to see him back in another months for follow-up, lab and treatment.  He will contact our office with any questions or concerns. We can certainly see him sooner if need be.   Laverna Peace, NP 2/11/201911:40 AM

## 2017-04-06 LAB — KAPPA/LAMBDA LIGHT CHAINS
KAPPA, LAMDA LIGHT CHAIN RATIO: 1.7 — AB (ref 0.26–1.65)
Kappa free light chain: 33.7 mg/L — ABNORMAL HIGH (ref 3.3–19.4)
LAMDA FREE LIGHT CHAINS: 19.8 mg/L (ref 5.7–26.3)

## 2017-04-06 LAB — IGG, IGA, IGM
IGG (IMMUNOGLOBIN G), SERUM: 1344 mg/dL (ref 700–1600)
IgA: 220 mg/dL (ref 61–437)
IgM (Immunoglobulin M), Srm: 37 mg/dL (ref 20–172)

## 2017-04-07 LAB — PROTEIN ELECTROPHORESIS, SERUM, WITH REFLEX
A/G Ratio: 1 (ref 0.7–1.7)
Albumin ELP: 3.5 g/dL (ref 2.9–4.4)
Alpha-1-Globulin: 0.2 g/dL (ref 0.0–0.4)
Alpha-2-Globulin: 1 g/dL (ref 0.4–1.0)
Beta Globulin: 1 g/dL (ref 0.7–1.3)
Gamma Globulin: 1.4 g/dL (ref 0.4–1.8)
Globulin, Total: 3.6 g/dL (ref 2.2–3.9)
M-Spike, %: 1 g/dL — ABNORMAL HIGH
SPEP Interpretation: 0
Total Protein ELP: 7.1 g/dL (ref 6.0–8.5)

## 2017-04-09 ENCOUNTER — Other Ambulatory Visit: Payer: Self-pay | Admitting: *Deleted

## 2017-04-09 DIAGNOSIS — C9000 Multiple myeloma not having achieved remission: Secondary | ICD-10-CM

## 2017-04-09 LAB — PROTEIN ELECTROPHORESIS, SERUM, WITH REFLEX
A/G Ratio: 1.1 (ref 0.7–1.7)
ALBUMIN ELP: 3.8 g/dL (ref 2.9–4.4)
ALPHA-1-GLOBULIN: 0.2 g/dL (ref 0.0–0.4)
Alpha-2-Globulin: 1.1 g/dL — ABNORMAL HIGH (ref 0.4–1.0)
Beta Globulin: 0.9 g/dL (ref 0.7–1.3)
Gamma Globulin: 1.3 g/dL (ref 0.4–1.8)
Globulin, Total: 3.5 g/dL (ref 2.2–3.9)
M-Spike, %: 0.8 g/dL — ABNORMAL HIGH
SPEP INTERP: 0
TOTAL PROTEIN ELP: 7.3 g/dL (ref 6.0–8.5)

## 2017-04-12 ENCOUNTER — Other Ambulatory Visit: Payer: Self-pay | Admitting: Oncology

## 2017-04-12 ENCOUNTER — Inpatient Hospital Stay: Payer: Medicare Other

## 2017-04-12 ENCOUNTER — Other Ambulatory Visit: Payer: Self-pay | Admitting: *Deleted

## 2017-04-12 ENCOUNTER — Other Ambulatory Visit: Payer: Self-pay

## 2017-04-12 VITALS — BP 127/61 | HR 65 | Temp 98.2°F | Resp 18 | Wt 198.0 lb

## 2017-04-12 DIAGNOSIS — C9000 Multiple myeloma not having achieved remission: Secondary | ICD-10-CM

## 2017-04-12 DIAGNOSIS — E349 Endocrine disorder, unspecified: Secondary | ICD-10-CM

## 2017-04-12 DIAGNOSIS — Z5112 Encounter for antineoplastic immunotherapy: Secondary | ICD-10-CM | POA: Diagnosis not present

## 2017-04-12 DIAGNOSIS — D631 Anemia in chronic kidney disease: Secondary | ICD-10-CM

## 2017-04-12 DIAGNOSIS — Z299 Encounter for prophylactic measures, unspecified: Secondary | ICD-10-CM

## 2017-04-12 DIAGNOSIS — N189 Chronic kidney disease, unspecified: Secondary | ICD-10-CM

## 2017-04-12 LAB — CBC WITH DIFFERENTIAL (CANCER CENTER ONLY)
Basophils Absolute: 0 10*3/uL (ref 0.0–0.1)
Basophils Relative: 1 %
EOS ABS: 0.3 10*3/uL (ref 0.0–0.5)
Eosinophils Relative: 6 %
HCT: 29.9 % — ABNORMAL LOW (ref 38.7–49.9)
Hemoglobin: 9.9 g/dL — ABNORMAL LOW (ref 13.0–17.1)
LYMPHS ABS: 1 10*3/uL (ref 0.9–3.3)
Lymphocytes Relative: 18 %
MCH: 25.6 pg — AB (ref 28.0–33.4)
MCHC: 33.1 g/dL (ref 32.0–35.9)
MCV: 77.3 fL — AB (ref 82.0–98.0)
MONOS PCT: 18 %
Monocytes Absolute: 1 10*3/uL — ABNORMAL HIGH (ref 0.1–0.9)
Neutro Abs: 3.2 10*3/uL (ref 1.5–6.5)
Neutrophils Relative %: 57 %
PLATELETS: 183 10*3/uL (ref 145–400)
RBC: 3.87 MIL/uL — ABNORMAL LOW (ref 4.20–5.70)
RDW: 16.9 % — AB (ref 11.1–15.7)
WBC: 5.6 10*3/uL (ref 4.0–10.0)

## 2017-04-12 LAB — CMP (CANCER CENTER ONLY)
ALBUMIN: 3.4 g/dL — AB (ref 3.5–5.0)
ALK PHOS: 72 U/L (ref 26–84)
ALT: 23 U/L (ref 10–47)
AST: 19 U/L (ref 11–38)
Anion gap: 8 (ref 5–15)
BILIRUBIN TOTAL: 0.5 mg/dL (ref 0.2–1.6)
BUN: 27 mg/dL — ABNORMAL HIGH (ref 7–22)
CALCIUM: 9.1 mg/dL (ref 8.0–10.3)
CHLORIDE: 108 mmol/L (ref 98–108)
CO2: 27 mmol/L (ref 18–33)
CREATININE: 1.9 mg/dL — AB (ref 0.60–1.20)
Glucose, Bld: 102 mg/dL (ref 73–118)
Potassium: 4.1 mmol/L (ref 3.3–4.7)
SODIUM: 143 mmol/L (ref 128–145)
Total Protein: 7.3 g/dL (ref 6.4–8.1)

## 2017-04-12 MED ORDER — SODIUM CHLORIDE 0.9 % IJ SOLN
10.0000 mL | Freq: Once | INTRAMUSCULAR | Status: AC
  Administered 2017-04-12: 10 mL
  Filled 2017-04-12: qty 10

## 2017-04-12 MED ORDER — HEPARIN SOD (PORK) LOCK FLUSH 100 UNIT/ML IV SOLN
500.0000 [IU] | Freq: Once | INTRAVENOUS | Status: AC | PRN
  Administered 2017-04-12: 500 [IU]
  Filled 2017-04-12: qty 5

## 2017-04-12 MED ORDER — DEXAMETHASONE SODIUM PHOSPHATE 10 MG/ML IJ SOLN
INTRAMUSCULAR | Status: AC
  Filled 2017-04-12: qty 1

## 2017-04-12 MED ORDER — DARBEPOETIN ALFA 300 MCG/0.6ML IJ SOSY
PREFILLED_SYRINGE | INTRAMUSCULAR | Status: AC
  Filled 2017-04-12: qty 0.6

## 2017-04-12 MED ORDER — LORAZEPAM 0.5 MG PO TABS: 0.5000 mg | ORAL_TABLET | Freq: Four times a day (QID) | ORAL | 2 refills | Status: DC | PRN

## 2017-04-12 MED ORDER — SODIUM CHLORIDE 0.9 % IV SOLN
Freq: Once | INTRAVENOUS | Status: AC
  Administered 2017-04-12: 13:00:00 via INTRAVENOUS

## 2017-04-12 MED ORDER — SODIUM CHLORIDE 0.9 % IV SOLN
300.0000 mg/m2 | Freq: Once | INTRAVENOUS | Status: AC
  Administered 2017-04-12: 620 mg via INTRAVENOUS
  Filled 2017-04-12: qty 31

## 2017-04-12 MED ORDER — DEXAMETHASONE SODIUM PHOSPHATE 10 MG/ML IJ SOLN
10.0000 mg | Freq: Once | INTRAMUSCULAR | Status: AC
  Administered 2017-04-12: 10 mg via INTRAVENOUS

## 2017-04-12 MED ORDER — TESTOSTERONE CYPIONATE 200 MG/ML IM SOLN
300.0000 mg | INTRAMUSCULAR | Status: DC
  Administered 2017-04-12: 300 mg via INTRAMUSCULAR

## 2017-04-12 MED ORDER — PROCHLORPERAZINE MALEATE 10 MG PO TABS: ORAL_TABLET | ORAL | 1 refills | Status: DC

## 2017-04-12 MED ORDER — SODIUM CHLORIDE 0.9% FLUSH
10.0000 mL | INTRAVENOUS | Status: DC | PRN
Start: 2017-04-12 — End: 2017-04-12
  Administered 2017-04-12: 10 mL
  Filled 2017-04-12: qty 10

## 2017-04-12 MED ORDER — SODIUM CHLORIDE 0.9 % IV SOLN
60.0000 mg | Freq: Once | INTRAVENOUS | Status: AC
  Administered 2017-04-12: 60 mg via INTRAVENOUS
  Filled 2017-04-12: qty 10

## 2017-04-12 MED ORDER — PALONOSETRON HCL INJECTION 0.25 MG/5ML
0.2500 mg | Freq: Once | INTRAVENOUS | Status: AC
  Administered 2017-04-12: 0.25 mg via INTRAVENOUS

## 2017-04-12 MED ORDER — DEXTROSE 5 % IV SOLN
27.0000 mg/m2 | Freq: Once | INTRAVENOUS | Status: AC
  Administered 2017-04-12: 56 mg via INTRAVENOUS
  Filled 2017-04-12: qty 28

## 2017-04-12 MED ORDER — DARBEPOETIN ALFA 300 MCG/0.6ML IJ SOSY
300.0000 ug | PREFILLED_SYRINGE | Freq: Once | INTRAMUSCULAR | Status: AC
  Administered 2017-04-12: 300 ug via SUBCUTANEOUS

## 2017-04-12 MED ORDER — PALONOSETRON HCL INJECTION 0.25 MG/5ML
INTRAVENOUS | Status: AC
  Filled 2017-04-12: qty 5

## 2017-04-12 MED ORDER — TESTOSTERONE CYPIONATE 200 MG/ML IM SOLN
INTRAMUSCULAR | Status: AC
  Filled 2017-04-12: qty 2

## 2017-04-12 NOTE — Patient Instructions (Signed)
Implanted Port Home Guide An implanted port is a type of central line that is placed under the skin. Central lines are used to provide IV access when treatment or nutrition needs to be given through a person's veins. Implanted ports are used for long-term IV access. An implanted port may be placed because:  You need IV medicine that would be irritating to the small veins in your hands or arms.  You need long-term IV medicines, such as antibiotics.  You need IV nutrition for a long period.  You need frequent blood draws for lab tests.  You need dialysis.  Implanted ports are usually placed in the chest area, but they can also be placed in the upper arm, the abdomen, or the leg. An implanted port has two main parts:  Reservoir. The reservoir is round and will appear as a small, raised area under your skin. The reservoir is the part where a needle is inserted to give medicines or draw blood.  Catheter. The catheter is a thin, flexible tube that extends from the reservoir. The catheter is placed into a large vein. Medicine that is inserted into the reservoir goes into the catheter and then into the vein.  How will I care for my incision site? Do not get the incision site wet. Bathe or shower as directed by your health care provider. How is my port accessed? Special steps must be taken to access the port:  Before the port is accessed, a numbing cream can be placed on the skin. This helps numb the skin over the port site.  Your health care provider uses a sterile technique to access the port. ? Your health care provider must put on a mask and sterile gloves. ? The skin over your port is cleaned carefully with an antiseptic and allowed to dry. ? The port is gently pinched between sterile gloves, and a needle is inserted into the port.  Only "non-coring" port needles should be used to access the port. Once the port is accessed, a blood return should be checked. This helps ensure that the port  is in the vein and is not clogged.  If your port needs to remain accessed for a constant infusion, a clear (transparent) bandage will be placed over the needle site. The bandage and needle will need to be changed every week, or as directed by your health care provider.  Keep the bandage covering the needle clean and dry. Do not get it wet. Follow your health care provider's instructions on how to take a shower or bath while the port is accessed.  If your port does not need to stay accessed, no bandage is needed over the port.  What is flushing? Flushing helps keep the port from getting clogged. Follow your health care provider's instructions on how and when to flush the port. Ports are usually flushed with saline solution or a medicine called heparin. The need for flushing will depend on how the port is used.  If the port is used for intermittent medicines or blood draws, the port will need to be flushed: ? After medicines have been given. ? After blood has been drawn. ? As part of routine maintenance.  If a constant infusion is running, the port may not need to be flushed.  How long will my port stay implanted? The port can stay in for as long as your health care provider thinks it is needed. When it is time for the port to come out, surgery will be   done to remove it. The procedure is similar to the one performed when the port was put in. When should I seek immediate medical care? When you have an implanted port, you should seek immediate medical care if:  You notice a bad smell coming from the incision site.  You have swelling, redness, or drainage at the incision site.  You have more swelling or pain at the port site or the surrounding area.  You have a fever that is not controlled with medicine.  This information is not intended to replace advice given to you by your health care provider. Make sure you discuss any questions you have with your health care provider. Document  Released: 02/09/2005 Document Revised: 07/18/2015 Document Reviewed: 10/17/2012 Elsevier Interactive Patient Education  2017 [employer]  

## 2017-04-12 NOTE — Progress Notes (Signed)
Patient complains of generalized bone pain. Patient rates his bone pain a 10 on the 0 to 10 pain scale. Patient states the pain is chronic in nature. Patient states Aredia helps alleviate the bone pain. Dr. [name] made aware. Patient will receive Aredia today per Dr. [name].

## 2017-04-12 NOTE — Patient Instructions (Signed)
[facility] Discharge Instructions for Patients Receiving Chemotherapy  Today you received the following chemotherapy agents Kyprolis/Cytoxan To help prevent nausea and vomiting after your treatment, we encourage you to take your nausea medication as prescribed.   If you develop nausea and vomiting that is not controlled by your nausea medication, call the clinic.   BELOW ARE SYMPTOMS THAT SHOULD BE REPORTED IMMEDIATELY:  *FEVER GREATER THAN 100.5 F  *CHILLS WITH OR WITHOUT FEVER  NAUSEA AND VOMITING THAT IS NOT CONTROLLED WITH YOUR NAUSEA MEDICATION  *UNUSUAL SHORTNESS OF BREATH  *UNUSUAL BRUISING OR BLEEDING  TENDERNESS IN MOUTH AND THROAT WITH OR WITHOUT PRESENCE OF ULCERS  *URINARY PROBLEMS  *BOWEL PROBLEMS  UNUSUAL RASH Items with * indicate a potential emergency and should be followed up as soon as possible.  Feel free to call the clinic should you have any questions or concerns. The clinic phone number is [phone].  Please show the [facility] at check-in to the Emergency Department and triage nurse.  Pamidronate injection (Aredia) What is this medicine? PAMIDRONATE (pa mi DROE nate) slows calcium loss from bones. It is used to treat high calcium blood levels from cancer or Paget's disease. It is also used to treat bone pain and prevent fractures from certain cancers that have spread to the bone. This medicine may be used for other purposes; ask your health care provider or pharmacist if you have questions. COMMON BRAND NAME(S): Aredia What should I tell my health care provider before I take this medicine? They need to know if you have any of these conditions: -aspirin-sensitive asthma -dental disease -kidney disease -an unusual or allergic reaction to pamidronate, other medicines, foods, dyes, or preservatives -pregnant or trying to get pregnant -breast-feeding How should I use this medicine? This medicine is for infusion into a vein. It  is given by a health care professional in a hospital or clinic setting. Talk to your pediatrician regarding the use of this medicine in children. This medicine is not approved for use in children. Overdosage: If you think you have taken too much of this medicine contact a poison control center or emergency room at once. NOTE: This medicine is only for you. Do not share this medicine with others. What if I miss a dose? This does not apply. What may interact with this medicine? -certain antibiotics given by injection -medicines for inflammation or pain like ibuprofen, naproxen -some diuretics like bumetanide, furosemide -cyclosporine -parathyroid hormone -tacrolimus -teriparatide -thalidomide This list may not describe all possible interactions. Give your health care provider a list of all the medicines, herbs, non-prescription drugs, or dietary supplements you use. Also tell them if you smoke, drink alcohol, or use illegal drugs. Some items may interact with your medicine. What should I watch for while using this medicine? Visit your doctor or health care professional for regular checkups. It may be some time before you see the benefit from this medicine. Do not stop taking your medicine unless your doctor tells you to. Your doctor may order blood tests or other tests to see how you are doing. Women should inform their doctor if they wish to become pregnant or think they might be pregnant. There is a potential for serious side effects to an unborn child. Talk to your health care professional or pharmacist for more information. You should make sure that you get enough calcium and vitamin D while you are taking this medicine. Discuss the foods you eat and the vitamins you take with  your health care professional. Some people who take this medicine have severe bone, joint, and/or muscle pain. This medicine may also increase your risk for a broken thigh bone. Tell your doctor right away if you have  pain in your upper leg or groin. Tell your doctor if you have any pain that does not go away or that gets worse. What side effects may I notice from receiving this medicine? Side effects that you should report to your doctor or health care professional as soon as possible: -allergic reactions like skin rash, itching or hives, swelling of the face, lips, or tongue -black or tarry stools -changes in vision -eye inflammation, pain -high blood pressure -jaw pain, especially burning or cramping -muscle weakness -numb, tingling pain -swelling of feet or hands -trouble passing urine or change in the amount of urine -unable to move easily Side effects that usually do not require medical attention (report to your doctor or health care professional if they continue or are bothersome): -bone, joint, or muscle pain -constipation -dizzy, drowsy -fever -headache -loss of appetite -nausea, vomiting -pain at site where injected This list may not describe all possible side effects. Call your doctor for medical advice about side effects. You may report side effects to FDA at 1-800-FDA-1088. Where should I keep my medicine? This drug is given in a hospital or clinic and will not be stored at home. NOTE: This sheet is a summary. It may not cover all possible information. If you have questions about this medicine, talk to your doctor, pharmacist, or health care provider.  2018 [employer]/Gold Standard (2010-08-08 08:49:49)

## 2017-04-16 ENCOUNTER — Other Ambulatory Visit: Payer: Self-pay | Admitting: *Deleted

## 2017-04-16 DIAGNOSIS — C9000 Multiple myeloma not having achieved remission: Secondary | ICD-10-CM

## 2017-04-19 ENCOUNTER — Inpatient Hospital Stay: Payer: Medicare Other

## 2017-04-19 ENCOUNTER — Other Ambulatory Visit: Payer: Self-pay | Admitting: Family

## 2017-04-19 DIAGNOSIS — C9000 Multiple myeloma not having achieved remission: Secondary | ICD-10-CM

## 2017-04-19 DIAGNOSIS — Z5112 Encounter for antineoplastic immunotherapy: Secondary | ICD-10-CM | POA: Diagnosis not present

## 2017-04-19 DIAGNOSIS — M2550 Pain in unspecified joint: Secondary | ICD-10-CM

## 2017-04-19 DIAGNOSIS — M4712 Other spondylosis with myelopathy, cervical region: Secondary | ICD-10-CM

## 2017-04-19 LAB — CBC WITH DIFFERENTIAL (CANCER CENTER ONLY)
BASOS ABS: 0 10*3/uL (ref 0.0–0.1)
BASOS PCT: 0 %
EOS ABS: 0.3 10*3/uL (ref 0.0–0.5)
Eosinophils Relative: 3 %
HEMATOCRIT: 31.1 % — AB (ref 38.7–49.9)
HEMOGLOBIN: 10.2 g/dL — AB (ref 13.0–17.1)
Lymphocytes Relative: 11 %
Lymphs Abs: 0.9 10*3/uL (ref 0.9–3.3)
MCH: 26.2 pg — ABNORMAL LOW (ref 28.0–33.4)
MCHC: 32.8 g/dL (ref 32.0–35.9)
MCV: 79.7 fL — ABNORMAL LOW (ref 82.0–98.0)
MONOS PCT: 11 %
Monocytes Absolute: 0.9 10*3/uL (ref 0.1–0.9)
NEUTROS ABS: 5.9 10*3/uL (ref 1.5–6.5)
NEUTROS PCT: 75 %
Platelet Count: 222 10*3/uL (ref 145–400)
RBC: 3.9 MIL/uL — AB (ref 4.20–5.70)
RDW: 18.7 % — ABNORMAL HIGH (ref 11.1–15.7)
WBC: 7.9 10*3/uL (ref 4.0–10.0)

## 2017-04-19 LAB — CMP (CANCER CENTER ONLY)
ALBUMIN: 3.4 g/dL — AB (ref 3.5–5.0)
ALT: 24 U/L (ref 10–47)
AST: 23 U/L (ref 11–38)
Alkaline Phosphatase: 78 U/L (ref 26–84)
Anion gap: 8 (ref 5–15)
BILIRUBIN TOTAL: 0.5 mg/dL (ref 0.2–1.6)
BUN: 18 mg/dL (ref 7–22)
CALCIUM: 9 mg/dL (ref 8.0–10.3)
CHLORIDE: 110 mmol/L — AB (ref 98–108)
CO2: 27 mmol/L (ref 18–33)
CREATININE: 2.1 mg/dL — AB (ref 0.60–1.20)
Glucose, Bld: 149 mg/dL — ABNORMAL HIGH (ref 73–118)
Potassium: 4.1 mmol/L (ref 3.3–4.7)
SODIUM: 145 mmol/L (ref 128–145)
Total Protein: 7.3 g/dL (ref 6.4–8.1)

## 2017-04-19 MED ORDER — PALONOSETRON HCL INJECTION 0.25 MG/5ML
0.2500 mg | Freq: Once | INTRAVENOUS | Status: AC
  Administered 2017-04-19: 0.25 mg via INTRAVENOUS

## 2017-04-19 MED ORDER — DEXAMETHASONE SODIUM PHOSPHATE 10 MG/ML IJ SOLN
10.0000 mg | Freq: Once | INTRAMUSCULAR | Status: AC
  Administered 2017-04-19: 10 mg via INTRAVENOUS

## 2017-04-19 MED ORDER — PALONOSETRON HCL INJECTION 0.25 MG/5ML
INTRAVENOUS | Status: AC
  Filled 2017-04-19: qty 5

## 2017-04-19 MED ORDER — HYDROMORPHONE HCL 1 MG/ML IJ SOLN
INTRAMUSCULAR | Status: AC
  Filled 2017-04-19: qty 2

## 2017-04-19 MED ORDER — SODIUM CHLORIDE 0.9 % IV SOLN: Freq: Once | INTRAVENOUS | Status: AC

## 2017-04-19 MED ORDER — SODIUM CHLORIDE 0.9 % IV SOLN
300.0000 mg/m2 | Freq: Once | INTRAVENOUS | Status: AC
  Administered 2017-04-19: 620 mg via INTRAVENOUS
  Filled 2017-04-19: qty 31

## 2017-04-19 MED ORDER — HYDROMORPHONE HCL 4 MG/ML IJ SOLN
2.0000 mg | Freq: Once | INTRAMUSCULAR | Status: AC
  Administered 2017-04-19: 2 mg via INTRAVENOUS

## 2017-04-19 MED ORDER — DEXTROSE 5 % IV SOLN
27.0000 mg/m2 | Freq: Once | INTRAVENOUS | Status: AC
  Administered 2017-04-19: 56 mg via INTRAVENOUS
  Filled 2017-04-19: qty 28

## 2017-04-19 MED ORDER — DEXAMETHASONE SODIUM PHOSPHATE 10 MG/ML IJ SOLN
INTRAMUSCULAR | Status: AC
  Filled 2017-04-19: qty 1

## 2017-04-19 MED ORDER — HEPARIN SOD (PORK) LOCK FLUSH 100 UNIT/ML IV SOLN
500.0000 [IU] | Freq: Once | INTRAVENOUS | Status: AC | PRN
  Administered 2017-04-19: 500 [IU]
  Filled 2017-04-19: qty 5

## 2017-04-19 MED ORDER — SODIUM CHLORIDE 0.9% FLUSH
10.0000 mL | INTRAVENOUS | Status: DC | PRN
  Administered 2017-04-19: 10 mL
  Filled 2017-04-19: qty 10

## 2017-04-19 MED ORDER — SODIUM CHLORIDE 0.9 % IV SOLN
Freq: Once | INTRAVENOUS | Status: AC
  Administered 2017-04-19: 09:00:00 via INTRAVENOUS

## 2017-04-19 NOTE — Progress Notes (Signed)
OK to treat with creatinine of 2.1 per DR Ennever. dph

## 2017-04-19 NOTE — Patient Instructions (Signed)
Carfilzomib injection What is this medicine? CARFILZOMIB (kar FILZ oh mib) targets a specific protein within cancer cells and stops the cancer cells from growing. It is used to treat multiple myeloma. This medicine may be used for other purposes; ask your health care provider or pharmacist if you have questions. COMMON BRAND NAME(S): KYPROLIS What should I tell my health care provider before I take this medicine? They need to know if you have any of these conditions: -heart disease -history of blood clots -irregular heartbeat -kidney disease -liver disease -lung or breathing disease -an unusual or allergic reaction to carfilzomib, or other medicines, foods, dyes, or preservatives -pregnant or trying to get pregnant -breast-feeding How should I use this medicine? This medicine is for injection or infusion into a vein. It is given by a health care professional in a hospital or clinic setting. Talk to your pediatrician regarding the use of this medicine in children. Special care may be needed. Overdosage: If you think you have taken too much of this medicine contact a poison control center or emergency room at once. NOTE: This medicine is only for you. Do not share this medicine with others. What if I miss a dose? It is important not to miss your dose. Call your doctor or health care professional if you are unable to keep an appointment. What may interact with this medicine? Interactions are not expected. Give your health care provider a list of all the medicines, herbs, non-prescription drugs, or dietary supplements you use. Also tell them if you smoke, drink alcohol, or use illegal drugs. Some items may interact with your medicine. This list may not describe all possible interactions. Give your health care provider a list of all the medicines, herbs, non-prescription drugs, or dietary supplements you use. Also tell them if you smoke, drink alcohol, or use illegal drugs. Some items may  interact with your medicine. What should I watch for while using this medicine? Your condition will be monitored carefully while you are receiving this medicine. Report any side effects. Continue your course of treatment even though you feel ill unless your doctor tells you to stop. You may need blood work done while you are taking this medicine. Do not become pregnant while taking this medicine or for at least 30 days after stopping it. Women should inform their doctor if they wish to become pregnant or think they might be pregnant. There is a potential for serious side effects to an unborn child. Men should not father a child while taking this medicine and for 90 days after stopping it. Talk to your health care professional or pharmacist for more information. Do not breast-feed an infant while taking this medicine. Check with your doctor or health care professional if you get an attack of severe diarrhea, nausea and vomiting, or if you sweat a lot. The loss of too much body fluid can make it dangerous for you to take this medicine. You may get dizzy. Do not drive, use machinery, or do anything that needs mental alertness until you know how this medicine affects you. Do not stand or sit up quickly, especially if you are an older patient. This reduces the risk of dizzy or fainting spells. What side effects may I notice from receiving this medicine? Side effects that you should report to your doctor or health care professional as soon as possible: -allergic reactions like skin rash, itching or hives, swelling of the face, lips, or tongue -confusion -dizziness -feeling faint or lightheaded -fever or chills -  palpitations -seizures -signs and symptoms of bleeding such as bloody or black, tarry stools; red or dark-brown urine; spitting up blood or brown material that looks like coffee grounds; red spots on the skin; unusual bruising or bleeding including from the eye, gums, or nose -signs and symptoms of  a blood clot such as breathing problems; changes in vision; chest pain; severe, sudden headache; pain, swelling, warmth in the leg; trouble speaking; sudden numbness or weakness of the face, arm or leg -signs and symptoms of kidney injury like trouble passing urine or change in the amount of urine -signs and symptoms of liver injury like dark yellow or brown urine; general ill feeling or flu-like symptoms; light-colored stools; loss of appetite; nausea; right upper belly pain; unusually weak or tired; yellowing of the eyes or skin Side effects that usually do not require medical attention (report to your doctor or health care professional if they continue or are bothersome): -back pain -cough -diarrhea -headache -muscle cramps -vomiting This list may not describe all possible side effects. Call your doctor for medical advice about side effects. You may report side effects to FDA at 1-800-FDA-1088. Where should I keep my medicine? This drug is given in a hospital or clinic and will not be stored at home. NOTE: This sheet is a summary. It may not cover all possible information. If you have questions about this medicine, talk to your doctor, pharmacist, or health care provider.  2018 Elsevier/Gold Standard (2015-03-14 13:39:23)  Cyclophosphamide injection What is this medicine? CYCLOPHOSPHAMIDE (sye kloe FOSS fa mide) is a chemotherapy drug. It slows the growth of cancer cells. This medicine is used to treat many types of cancer like lymphoma, myeloma, leukemia, breast cancer, and ovarian cancer, to name a few. This medicine may be used for other purposes; ask your health care provider or pharmacist if you have questions. COMMON BRAND NAME(S): Cytoxan, Neosar What should I tell my health care provider before I take this medicine? They need to know if you have any of these conditions: -blood disorders -history of other chemotherapy -infection -kidney disease -liver disease -recent or ongoing  radiation therapy -tumors in the bone marrow -an unusual or allergic reaction to cyclophosphamide, other chemotherapy, other medicines, foods, dyes, or preservatives -pregnant or trying to get pregnant -breast-feeding How should I use this medicine? This drug is usually given as an injection into a vein or muscle or by infusion into a vein. It is administered in a hospital or clinic by a specially trained health care professional. Talk to your pediatrician regarding the use of this medicine in children. Special care may be needed. Overdosage: If you think you have taken too much of this medicine contact a poison control center or emergency room at once. NOTE: This medicine is only for you. Do not share this medicine with others. What if I miss a dose? It is important not to miss your dose. Call your doctor or health care professional if you are unable to keep an appointment. What may interact with this medicine? This medicine may interact with the following medications: -amiodarone -amphotericin B -azathioprine -certain antiviral medicines for HIV or AIDS such as protease inhibitors (e.g., indinavir, ritonavir) and zidovudine -certain blood pressure medications such as benazepril, captopril, enalapril, fosinopril, lisinopril, moexipril, monopril, perindopril, quinapril, ramipril, trandolapril -certain cancer medications such as anthracyclines (e.g., daunorubicin, doxorubicin), busulfan, cytarabine, paclitaxel, pentostatin, tamoxifen, trastuzumab -certain diuretics such as chlorothiazide, chlorthalidone, hydrochlorothiazide, indapamide, metolazone -certain medicines that treat or prevent blood clots like warfarin -certain   muscle relaxants such as succinylcholine -cyclosporine -etanercept -indomethacin -medicines to increase blood counts like filgrastim, pegfilgrastim, sargramostim -medicines used as general anesthesia -metronidazole -natalizumab This list may not describe all possible  interactions. Give your health care provider a list of all the medicines, herbs, non-prescription drugs, or dietary supplements you use. Also tell them if you smoke, drink alcohol, or use illegal drugs. Some items may interact with your medicine. What should I watch for while using this medicine? Visit your doctor for checks on your progress. This drug may make you feel generally unwell. This is not uncommon, as chemotherapy can affect healthy cells as well as cancer cells. Report any side effects. Continue your course of treatment even though you feel ill unless your doctor tells you to stop. Drink water or other fluids as directed. Urinate often, even at night. In some cases, you may be given additional medicines to help with side effects. Follow all directions for their use. Call your doctor or health care professional for advice if you get a fever, chills or sore throat, or other symptoms of a cold or flu. Do not treat yourself. This drug decreases your body's ability to fight infections. Try to avoid being around people who are sick. This medicine may increase your risk to bruise or bleed. Call your doctor or health care professional if you notice any unusual bleeding. Be careful brushing and flossing your teeth or using a toothpick because you may get an infection or bleed more easily. If you have any dental work done, tell your dentist you are receiving this medicine. You may get drowsy or dizzy. Do not drive, use machinery, or do anything that needs mental alertness until you know how this medicine affects you. Do not become pregnant while taking this medicine or for 1 year after stopping it. Women should inform their doctor if they wish to become pregnant or think they might be pregnant. Men should not father a child while taking this medicine and for 4 months after stopping it. There is a potential for serious side effects to an unborn child. Talk to your health care professional or pharmacist for  more information. Do not breast-feed an infant while taking this medicine. This medicine may interfere with the ability to have a child. This medicine has caused ovarian failure in some women. This medicine has caused reduced sperm counts in some men. You should talk with your doctor or health care professional if you are concerned about your fertility. If you are going to have surgery, tell your doctor or health care professional that you have taken this medicine. What side effects may I notice from receiving this medicine? Side effects that you should report to your doctor or health care professional as soon as possible: -allergic reactions like skin rash, itching or hives, swelling of the face, lips, or tongue -low blood counts - this medicine may decrease the number of white blood cells, red blood cells and platelets. You may be at increased risk for infections and bleeding. -signs of infection - fever or chills, cough, sore throat, pain or difficulty passing urine -signs of decreased platelets or bleeding - bruising, pinpoint red spots on the skin, black, tarry stools, blood in the urine -signs of decreased red blood cells - unusually weak or tired, fainting spells, lightheadedness -breathing problems -dark urine -dizziness -palpitations -swelling of the ankles, feet, hands -trouble passing urine or change in the amount of urine -weight gain -yellowing of the eyes or skin Side effects that   usually do not require medical attention (report to your doctor or health care professional if they continue or are bothersome): -changes in nail or skin color -hair loss -missed menstrual periods -mouth sores -nausea, vomiting This list may not describe all possible side effects. Call your doctor for medical advice about side effects. You may report side effects to FDA at 1-800-FDA-1088. Where should I keep my medicine? This drug is given in a hospital or clinic and will not be stored at  home. NOTE: This sheet is a summary. It may not cover all possible information. If you have questions about this medicine, talk to your doctor, pharmacist, or health care provider.  2018 Elsevier/Gold Standard (2011-12-25 16:22:58)  

## 2017-04-19 NOTE — Patient Instructions (Signed)
Implanted Port Home Guide An implanted port is a type of central line that is placed under the skin. Central lines are used to provide IV access when treatment or nutrition needs to be given through a person's veins. Implanted ports are used for long-term IV access. An implanted port may be placed because:  You need IV medicine that would be irritating to the small veins in your hands or arms.  You need long-term IV medicines, such as antibiotics.  You need IV nutrition for a long period.  You need frequent blood draws for lab tests.  You need dialysis.  Implanted ports are usually placed in the chest area, but they can also be placed in the upper arm, the abdomen, or the leg. An implanted port has two main parts:  Reservoir. The reservoir is round and will appear as a small, raised area under your skin. The reservoir is the part where a needle is inserted to give medicines or draw blood.  Catheter. The catheter is a thin, flexible tube that extends from the reservoir. The catheter is placed into a large vein. Medicine that is inserted into the reservoir goes into the catheter and then into the vein.  How will I care for my incision site? Do not get the incision site wet. Bathe or shower as directed by your health care provider. How is my port accessed? Special steps must be taken to access the port:  Before the port is accessed, a numbing cream can be placed on the skin. This helps numb the skin over the port site.  Your health care provider uses a sterile technique to access the port. ? Your health care provider must put on a mask and sterile gloves. ? The skin over your port is cleaned carefully with an antiseptic and allowed to dry. ? The port is gently pinched between sterile gloves, and a needle is inserted into the port.  Only "non-coring" port needles should be used to access the port. Once the port is accessed, a blood return should be checked. This helps ensure that the port  is in the vein and is not clogged.  If your port needs to remain accessed for a constant infusion, a clear (transparent) bandage will be placed over the needle site. The bandage and needle will need to be changed every week, or as directed by your health care provider.  Keep the bandage covering the needle clean and dry. Do not get it wet. Follow your health care provider's instructions on how to take a shower or bath while the port is accessed.  If your port does not need to stay accessed, no bandage is needed over the port.  What is flushing? Flushing helps keep the port from getting clogged. Follow your health care provider's instructions on how and when to flush the port. Ports are usually flushed with saline solution or a medicine called heparin. The need for flushing will depend on how the port is used.  If the port is used for intermittent medicines or blood draws, the port will need to be flushed: ? After medicines have been given. ? After blood has been drawn. ? As part of routine maintenance.  If a constant infusion is running, the port may not need to be flushed.  How long will my port stay implanted? The port can stay in for as long as your health care provider thinks it is needed. When it is time for the port to come out, surgery will be   done to remove it. The procedure is similar to the one performed when the port was put in. When should I seek immediate medical care? When you have an implanted port, you should seek immediate medical care if:  You notice a bad smell coming from the incision site.  You have swelling, redness, or drainage at the incision site.  You have more swelling or pain at the port site or the surrounding area.  You have a fever that is not controlled with medicine.  This information is not intended to replace advice given to you by your health care provider. Make sure you discuss any questions you have with your health care provider. Document  Released: 02/09/2005 Document Revised: 07/18/2015 Document Reviewed: 10/17/2012 Elsevier Interactive Patient Education  2017 [employer]  

## 2017-04-20 ENCOUNTER — Telehealth: Payer: Self-pay | Admitting: Hematology & Oncology

## 2017-04-20 NOTE — Telephone Encounter (Signed)
[place] [employer] INFO  ID: [id number]  P: [phone] F: [phone]

## 2017-05-03 ENCOUNTER — Inpatient Hospital Stay: Payer: Medicare Other

## 2017-05-03 ENCOUNTER — Inpatient Hospital Stay: Payer: Medicare Other | Attending: Hematology & Oncology | Admitting: Family

## 2017-05-03 ENCOUNTER — Other Ambulatory Visit: Payer: Self-pay

## 2017-05-03 VITALS — BP 131/66 | HR 75 | Temp 98.1°F | Resp 16 | Wt 198.0 lb

## 2017-05-03 DIAGNOSIS — M549 Dorsalgia, unspecified: Secondary | ICD-10-CM | POA: Diagnosis not present

## 2017-05-03 DIAGNOSIS — M4712 Other spondylosis with myelopathy, cervical region: Secondary | ICD-10-CM

## 2017-05-03 DIAGNOSIS — D631 Anemia in chronic kidney disease: Secondary | ICD-10-CM | POA: Insufficient documentation

## 2017-05-03 DIAGNOSIS — C9000 Multiple myeloma not having achieved remission: Secondary | ICD-10-CM

## 2017-05-03 DIAGNOSIS — N189 Chronic kidney disease, unspecified: Secondary | ICD-10-CM

## 2017-05-03 DIAGNOSIS — D508 Other iron deficiency anemias: Secondary | ICD-10-CM

## 2017-05-03 DIAGNOSIS — E349 Endocrine disorder, unspecified: Secondary | ICD-10-CM

## 2017-05-03 DIAGNOSIS — M545 Low back pain: Secondary | ICD-10-CM

## 2017-05-03 DIAGNOSIS — D509 Iron deficiency anemia, unspecified: Secondary | ICD-10-CM

## 2017-05-03 DIAGNOSIS — G8929 Other chronic pain: Secondary | ICD-10-CM

## 2017-05-03 DIAGNOSIS — D500 Iron deficiency anemia secondary to blood loss (chronic): Secondary | ICD-10-CM

## 2017-05-03 DIAGNOSIS — Z5112 Encounter for antineoplastic immunotherapy: Secondary | ICD-10-CM | POA: Diagnosis present

## 2017-05-03 DIAGNOSIS — Z5111 Encounter for antineoplastic chemotherapy: Secondary | ICD-10-CM | POA: Insufficient documentation

## 2017-05-03 DIAGNOSIS — M2550 Pain in unspecified joint: Secondary | ICD-10-CM | POA: Diagnosis not present

## 2017-05-03 DIAGNOSIS — Z299 Encounter for prophylactic measures, unspecified: Secondary | ICD-10-CM

## 2017-05-03 LAB — CBC WITH DIFFERENTIAL (CANCER CENTER ONLY)
BASOS PCT: 1 %
Basophils Absolute: 0.1 10*3/uL (ref 0.0–0.1)
EOS ABS: 0.2 10*3/uL (ref 0.0–0.5)
Eosinophils Relative: 4 %
HEMATOCRIT: 29.9 % — AB (ref 38.7–49.9)
HEMOGLOBIN: 9.7 g/dL — AB (ref 13.0–17.1)
LYMPHS ABS: 0.8 10*3/uL — AB (ref 0.9–3.3)
Lymphocytes Relative: 17 %
MCH: 25.9 pg — AB (ref 28.0–33.4)
MCHC: 32.4 g/dL (ref 32.0–35.9)
MCV: 79.7 fL — ABNORMAL LOW (ref 82.0–98.0)
MONO ABS: 0.6 10*3/uL (ref 0.1–0.9)
Monocytes Relative: 13 %
NEUTROS PCT: 65 %
Neutro Abs: 3.1 10*3/uL (ref 1.5–6.5)
Platelet Count: 250 10*3/uL (ref 145–400)
RBC: 3.75 MIL/uL — ABNORMAL LOW (ref 4.20–5.70)
RDW: 17.8 % — AB (ref 11.1–15.7)
WBC Count: 4.8 10*3/uL (ref 4.0–10.0)

## 2017-05-03 LAB — CMP (CANCER CENTER ONLY)
ALBUMIN: 3.5 g/dL (ref 3.5–5.0)
ALT: 22 U/L (ref 10–47)
AST: 22 U/L (ref 11–38)
Alkaline Phosphatase: 59 U/L (ref 26–84)
Anion gap: 6 (ref 5–15)
BILIRUBIN TOTAL: 0.5 mg/dL (ref 0.2–1.6)
BUN: 32 mg/dL — ABNORMAL HIGH (ref 7–22)
CALCIUM: 8.9 mg/dL (ref 8.0–10.3)
CHLORIDE: 103 mmol/L (ref 98–108)
CO2: 30 mmol/L (ref 18–33)
CREATININE: 1.7 mg/dL — AB (ref 0.60–1.20)
Glucose, Bld: 159 mg/dL — ABNORMAL HIGH (ref 73–118)
Potassium: 4.4 mmol/L (ref 3.3–4.7)
Sodium: 139 mmol/L (ref 128–145)
Total Protein: 6.7 g/dL (ref 6.4–8.1)

## 2017-05-03 LAB — IRON AND TIBC
Iron: 63 ug/dL (ref 42–163)
SATURATION RATIOS: 30 % — AB (ref 42–163)
TIBC: 206 ug/dL (ref 202–409)
UIBC: 143 ug/dL

## 2017-05-03 LAB — FERRITIN: Ferritin: 1290 ng/mL — ABNORMAL HIGH (ref 22–316)

## 2017-05-03 MED ORDER — PALONOSETRON HCL INJECTION 0.25 MG/5ML
0.2500 mg | Freq: Once | INTRAVENOUS | Status: AC
  Administered 2017-05-03: 0.25 mg via INTRAVENOUS

## 2017-05-03 MED ORDER — HEPARIN SOD (PORK) LOCK FLUSH 100 UNIT/ML IV SOLN
500.0000 [IU] | Freq: Once | INTRAVENOUS | Status: AC | PRN
  Administered 2017-05-03: 500 [IU]
  Filled 2017-05-03: qty 5

## 2017-05-03 MED ORDER — DARBEPOETIN ALFA 300 MCG/0.6ML IJ SOSY
PREFILLED_SYRINGE | INTRAMUSCULAR | Status: AC
  Filled 2017-05-03: qty 0.6

## 2017-05-03 MED ORDER — DARBEPOETIN ALFA 300 MCG/0.6ML IJ SOSY
300.0000 ug | PREFILLED_SYRINGE | Freq: Once | INTRAMUSCULAR | Status: AC
  Administered 2017-05-03: 300 ug via SUBCUTANEOUS

## 2017-05-03 MED ORDER — DEXTROSE 5 % IV SOLN
60.0000 mg | Freq: Once | INTRAVENOUS | Status: AC
  Administered 2017-05-03: 60 mg via INTRAVENOUS
  Filled 2017-05-03: qty 30

## 2017-05-03 MED ORDER — DEXAMETHASONE SODIUM PHOSPHATE 10 MG/ML IJ SOLN
INTRAMUSCULAR | Status: AC
  Filled 2017-05-03: qty 1

## 2017-05-03 MED ORDER — PALONOSETRON HCL INJECTION 0.25 MG/5ML
INTRAVENOUS | Status: AC
  Filled 2017-05-03: qty 5

## 2017-05-03 MED ORDER — SODIUM CHLORIDE 0.9 % IV SOLN
Freq: Once | INTRAVENOUS | Status: AC
  Administered 2017-05-03: 10:00:00 via INTRAVENOUS

## 2017-05-03 MED ORDER — SODIUM CHLORIDE 0.9 % IV SOLN
300.0000 mg/m2 | Freq: Once | INTRAVENOUS | Status: AC
  Administered 2017-05-03: 620 mg via INTRAVENOUS
  Filled 2017-05-03: qty 31

## 2017-05-03 MED ORDER — SODIUM CHLORIDE 0.9% FLUSH
10.0000 mL | INTRAVENOUS | Status: DC | PRN
  Administered 2017-05-03: 10 mL
  Filled 2017-05-03: qty 10

## 2017-05-03 MED ORDER — DEXAMETHASONE SODIUM PHOSPHATE 10 MG/ML IJ SOLN
10.0000 mg | Freq: Once | INTRAMUSCULAR | Status: AC
  Administered 2017-05-03: 10 mg via INTRAVENOUS

## 2017-05-03 NOTE — Progress Notes (Signed)
Hematology and Oncology Follow Up Visit  [name] [id number] December 04, 1950 67 y.o. 05/03/2017   Principle Diagnosis:  IgG kappa myeloma Anemia secondary to renal insufficiency Intermittent iron - deficiency anemia Hypotestosteronemia  Current Therapy:   Aredia 60 mg IV q 2 months  Aranesp 300 mcg subcu as needed for hemoglobin less than 11 Depo-Testosterone 300 mg q 4 weeks IV iron as indicated Kyprolis/Cytoxan - s/p cycle 4   Interim History:  [name] is here today for follow-up and treatment. He is feeling fatigue and having increased lower back and joint pain. He states that he is not taking the Embeda. He states that he is taking Oxycodone and Percocet as directed for degenerative disc disease in the lumbar spine. He does not feel that this is managing his pain well. This is managed by his PCP Dr. Jonelle Sidle so we will him follow-up back up with that office.  He tries to stay up and moving around. It is hard for him to sit for long due to the discomfort.  February M-spike was down to 0.8, IgG level 1,344 mg/dL and kappa light chain 3.37 mg/dL.  No lymphadenopathy noted on exam.  No episodes of bleeding, no bruising or petechiae.  He has had no issue with infection. No fever, chills, n/v, cough, rash, dizziness, SOB, chest pain, palpitations, abdominal pain or changes in bowel or bladder habits.  He has chronic constipation and takes Miralax as needed.  The neuropathy in his feet is unchanged. No swelling in his extremities at this time.  His appetite comes and goes. He is drinking a lot of Ensure. He feels like he is hydrating well. His weight is stable.   ECOG Performance Status: 1 - Symptomatic but completely ambulatory  Medications:  Allergies as of 05/03/2017      Reactions   Contrast Media [iodinated Diagnostic Agents]    Patient states he was instructed not to take IV contrast.  In 2008 he had an unknown reaction, and was told not to take it again.  He was also told not  to take it due to his kidneys.   Iodine Anxiety, Rash, Other (See Comments)   Didn't feel right "instructed not to take per MD--something with his port" Didn't feel right      Medication List        Accurate as of 05/03/17  9:51 AM. Always use your most recent med list.          amLODipine 10 MG tablet Commonly known as:  NORVASC Take 10 mg by mouth daily.   CARAFATE 1 GM/10ML suspension Generic drug:  sucralfate Take 1 g by mouth 4 (four) times daily.   CLEAR EYES ALL SEASONS 5-6 MG/ML Soln Generic drug:  Polyvinyl Alcohol-Povidone Place 2 drops into both eyes 3 (three) times daily as needed (drye eyes.).   dicyclomine 10 MG capsule Commonly known as:  BENTYL Take 1 capsule (10 mg total) by mouth 3 (three) times daily.   EMBEDA 20-0.8 MG Cpcr Generic drug:  Morphine-Naltrexone Take 1 capsule by mouth 3 (three) times daily.   EMBEDA 30-1.2 MG Cpcr Generic drug:  Morphine-Naltrexone Take 3 (three) times daily by mouth.   furosemide 40 MG tablet Commonly known as:  LASIX Take 40 mg by mouth.   glipiZIDE 5 MG tablet Commonly known as:  GLUCOTROL Take 5 mg by mouth 2 (two) times daily before a meal.   isosorbide mononitrate 30 MG 24 hr tablet Commonly known as:  IMDUR Take 30 mg  by mouth daily.   latanoprost 0.005 % ophthalmic solution Commonly known as:  XALATAN Place 1 drop into the right eye every evening.   lidocaine-prilocaine cream Commonly known as:  EMLA Apply 1 application topically as needed.   linaclotide 145 MCG Caps capsule Commonly known as:  LINZESS Take 1 capsule (145 mcg total) by mouth daily before breakfast.   LORazepam 0.5 MG tablet Commonly known as:  ATIVAN Take 1 tablet (0.5 mg total) by mouth every 6 (six) hours as needed (Nausea or vomiting).   losartan 25 MG tablet Commonly known as:  COZAAR Take 25 mg by mouth.   meclizine 50 MG tablet Commonly known as:  ANTIVERT Take 0.5 tablets (25 mg total) by mouth 4 (four) times  daily as needed for dizziness.   methocarbamol 500 MG tablet Commonly known as:  ROBAXIN Take 500 mg by mouth 4 (four) times daily.   mirabegron ER 50 MG Tb24 tablet Commonly known as:  MYRBETRIQ Take 50 mg by mouth daily.   omeprazole 40 MG capsule Commonly known as:  PRILOSEC take 1 capsule by mouth twice a day   ondansetron 4 MG disintegrating tablet Commonly known as:  ZOFRAN ODT Take 1 tablet (4 mg total) by mouth every 8 (eight) hours as needed for nausea or vomiting.   ondansetron 8 MG tablet Commonly known as:  ZOFRAN Take 1 tablet (8 mg total) 2 (two) times daily as needed by mouth (Nausea or vomiting).   orphenadrine 100 MG tablet Commonly known as:  NORFLEX Take 1 tablet (100 mg total) by mouth 2 (two) times daily.   oxyCODONE 20 mg 12 hr tablet Commonly known as:  OXYCONTIN Take 1 tablet (20 mg total) by mouth every 12 (twelve) hours.   oxyCODONE-acetaminophen 10-325 MG tablet Commonly known as:  PERCOCET Take 1 tablet by mouth every 6 (six) hours as needed for pain.   potassium chloride SA 20 MEQ tablet Commonly known as:  K-DUR,KLOR-CON Take 1 tablet (20 mEq total) by mouth 2 (two) times daily.   prochlorperazine 10 MG tablet Commonly known as:  COMPAZINE take 1 tablet by mouth every 6 hours if needed for nausea and vomiting   TOPROL XL 50 MG 24 hr tablet Generic drug:  metoprolol succinate Take 50 mg by mouth daily.   Vitamin D (Ergocalciferol) 50000 units Caps capsule Commonly known as:  DRISDOL Take 50,000 Units by mouth every Saturday.   ZIOPTAN 0.0015 % Soln Generic drug:  Tafluprost Place 1 drop into both eyes daily.       Allergies:  Allergies  Allergen Reactions  . Contrast Media [Iodinated Diagnostic Agents]     Patient states he was instructed not to take IV contrast.  In 2008 he had an unknown reaction, and was told not to take it again.  He was also told not to take it due to his kidneys.  . Iodine Anxiety, Rash and Other (See  Comments)    Didn't feel right "instructed not to take per MD--something with his port" Didn't feel right    Past Medical History, Surgical history, Social history, and Family History were reviewed and updated.  Review of Systems: All other 10 point review of systems is negative.   Physical Exam:  weight is 198 lb (89.8 kg). His oral temperature is 98.1 F (36.7 C). His blood pressure is 131/66 and his pulse is 75. His respiration is 16 and oxygen saturation is 100%.   Wt Readings from Last 3 Encounters:  05/03/17 198  lb (89.8 kg)  04/12/17 198 lb (89.8 kg)  04/05/17 197 lb (89.4 kg)    Ocular: Sclerae unicteric, pupils equal, round and reactive to light Ear-nose-throat: Oropharynx clear, dentition fair Lymphatic: No cervical, supraclavicular or axillary adenopathy Lungs no rales or rhonchi, good excursion bilaterally Heart regular rate and rhythm, no murmur appreciated Abd soft, nontender, positive bowel sounds, no liver or spleen tip palpated on exam, no fluid wave  MSK no focal spinal tenderness, no joint edema Neuro: non-focal, well-oriented, appropriate affect Breasts: Deferred   Lab Results  Component Value Date   WBC 4.8 05/03/2017   HGB 11.9 (L) 02/22/2017   HCT 29.9 (L) 05/03/2017   MCV 79.7 (L) 05/03/2017   PLT 250 05/03/2017   Lab Results  Component Value Date   FERRITIN 1,315 (H) 04/05/2017   IRON 64 04/05/2017   TIBC 221 04/05/2017   UIBC 157 04/05/2017   IRONPCTSAT 29 (L) 04/05/2017   Lab Results  Component Value Date   RETICCTPCT 0.6 03/21/2014   RBC 3.75 (L) 05/03/2017   RETICCTABS 27.5 03/21/2014   Lab Results  Component Value Date   KPAFRELGTCHN 33.7 (H) 04/05/2017   LAMBDASER 19.8 04/05/2017   KAPLAMBRATIO 1.70 (H) 04/05/2017   Lab Results  Component Value Date   IGGSERUM 1,344 04/05/2017   IGA 220 04/05/2017   IGMSERUM 37 04/05/2017   Lab Results  Component Value Date   TOTALPROTELP 7.3 04/05/2017   ALBUMINELP 3.8 04/05/2017    A1GS 0.2 04/05/2017   A2GS 1.1 (H) 04/05/2017   BETS 0.9 04/05/2017   BETA2SER 0.5 11/07/2014   GAMS 1.3 04/05/2017   MSPIKE 0.8 (H) 04/05/2017   SPEI * 11/07/2014     Chemistry      Component Value Date/Time   NA 139 05/03/2017 0859   NA 143 02/22/2017 0744   K 4.4 05/03/2017 0859   K 4.0 02/22/2017 0744   CL 103 05/03/2017 0859   CL 106 02/22/2017 0744   CO2 30 05/03/2017 0859   CO2 27 02/22/2017 0744   BUN 32 (H) 05/03/2017 0859   BUN 26 (H) 02/22/2017 0744   CREATININE 1.70 (H) 05/03/2017 0859   CREATININE 2.3 (H) 02/22/2017 0744      Component Value Date/Time   CALCIUM 8.9 05/03/2017 0859   CALCIUM 9.3 02/22/2017 0744   ALKPHOS 59 05/03/2017 0859   ALKPHOS 80 02/22/2017 0744   AST 22 05/03/2017 0859   ALT 22 05/03/2017 0859   ALT 22 02/22/2017 0744   BILITOT 0.5 05/03/2017 0859      Impression and Plan: [name] is a very pleasant 67 yo African American gentleman with IgG kappa myeloma. His complaint today is persistent lower back and joint pain not managed with his current pain medication regimen. This is followed by his PCP so I will route my note to them today and [name] was also encouraged to follow-up with their office.  His Myelom studies have remained stable. Results for today's labs are pending.  Hgb is 9.7 so he will receive Aranesp today.  We will proceed with cycle 5 of treatment today as planned.  We will see what his iron studies show and bring him back in for infusion if needed.  We will plan to see him back in another month for MD follow-up, lab and treatment.  He will contact our office with any questions or concerns. We can certainly see him sooner if need be.   Laverna Peace, NP 3/11/20199:51 AM\

## 2017-05-03 NOTE — Patient Instructions (Signed)
Cyclophosphamide injection What is this medicine? CYCLOPHOSPHAMIDE (sye kloe FOSS fa mide) is a chemotherapy drug. It slows the growth of cancer cells. This medicine is used to treat many types of cancer like lymphoma, myeloma, leukemia, breast cancer, and ovarian cancer, to name a few. This medicine may be used for other purposes; ask your health care provider or pharmacist if you have questions. COMMON BRAND NAME(S): Cytoxan, Neosar What should I tell my health care provider before I take this medicine? They need to know if you have any of these conditions: -blood disorders -history of other chemotherapy -infection -kidney disease -liver disease -recent or ongoing radiation therapy -tumors in the bone marrow -an unusual or allergic reaction to cyclophosphamide, other chemotherapy, other medicines, foods, dyes, or preservatives -pregnant or trying to get pregnant -breast-feeding How should I use this medicine? This drug is usually given as an injection into a vein or muscle or by infusion into a vein. It is administered in a hospital or clinic by a specially trained health care professional. Talk to your pediatrician regarding the use of this medicine in children. Special care may be needed. Overdosage: If you think you have taken too much of this medicine contact a poison control center or emergency room at once. NOTE: This medicine is only for you. Do not share this medicine with others. What if I miss a dose? It is important not to miss your dose. Call your doctor or health care professional if you are unable to keep an appointment. What may interact with this medicine? This medicine may interact with the following medications: -amiodarone -amphotericin B -azathioprine -certain antiviral medicines for HIV or AIDS such as protease inhibitors (e.g., indinavir, ritonavir) and zidovudine -certain blood pressure medications such as benazepril, captopril, enalapril, fosinopril,  lisinopril, moexipril, monopril, perindopril, quinapril, ramipril, trandolapril -certain cancer medications such as anthracyclines (e.g., daunorubicin, doxorubicin), busulfan, cytarabine, paclitaxel, pentostatin, tamoxifen, trastuzumab -certain diuretics such as chlorothiazide, chlorthalidone, hydrochlorothiazide, indapamide, metolazone -certain medicines that treat or prevent blood clots like warfarin -certain muscle relaxants such as succinylcholine -cyclosporine -etanercept -indomethacin -medicines to increase blood counts like filgrastim, pegfilgrastim, sargramostim -medicines used as general anesthesia -metronidazole -natalizumab This list may not describe all possible interactions. Give your health care provider a list of all the medicines, herbs, non-prescription drugs, or dietary supplements you use. Also tell them if you smoke, drink alcohol, or use illegal drugs. Some items may interact with your medicine. What should I watch for while using this medicine? Visit your doctor for checks on your progress. This drug may make you feel generally unwell. This is not uncommon, as chemotherapy can affect healthy cells as well as cancer cells. Report any side effects. Continue your course of treatment even though you feel ill unless your doctor tells you to stop. Drink water or other fluids as directed. Urinate often, even at night. In some cases, you may be given additional medicines to help with side effects. Follow all directions for their use. Call your doctor or health care professional for advice if you get a fever, chills or sore throat, or other symptoms of a cold or flu. Do not treat yourself. This drug decreases your body's ability to fight infections. Try to avoid being around people who are sick. This medicine may increase your risk to bruise or bleed. Call your doctor or health care professional if you notice any unusual bleeding. Be careful brushing and flossing your teeth or using a  toothpick because you may get an infection or bleed   more easily. If you have any dental work done, tell your dentist you are receiving this medicine. You may get drowsy or dizzy. Do not drive, use machinery, or do anything that needs mental alertness until you know how this medicine affects you. Do not become pregnant while taking this medicine or for 1 year after stopping it. Women should inform their doctor if they wish to become pregnant or think they might be pregnant. Men should not father a child while taking this medicine and for 4 months after stopping it. There is a potential for serious side effects to an unborn child. Talk to your health care professional or pharmacist for more information. Do not breast-feed an infant while taking this medicine. This medicine may interfere with the ability to have a child. This medicine has caused ovarian failure in some women. This medicine has caused reduced sperm counts in some men. You should talk with your doctor or health care professional if you are concerned about your fertility. If you are going to have surgery, tell your doctor or health care professional that you have taken this medicine. What side effects may I notice from receiving this medicine? Side effects that you should report to your doctor or health care professional as soon as possible: -allergic reactions like skin rash, itching or hives, swelling of the face, lips, or tongue -low blood counts - this medicine may decrease the number of white blood cells, red blood cells and platelets. You may be at increased risk for infections and bleeding. -signs of infection - fever or chills, cough, sore throat, pain or difficulty passing urine -signs of decreased platelets or bleeding - bruising, pinpoint red spots on the skin, black, tarry stools, blood in the urine -signs of decreased red blood cells - unusually weak or tired, fainting spells, lightheadedness -breathing problems -dark  urine -dizziness -palpitations -swelling of the ankles, feet, hands -trouble passing urine or change in the amount of urine -weight gain -yellowing of the eyes or skin Side effects that usually do not require medical attention (report to your doctor or health care professional if they continue or are bothersome): -changes in nail or skin color -hair loss -missed menstrual periods -mouth sores -nausea, vomiting This list may not describe all possible side effects. Call your doctor for medical advice about side effects. You may report side effects to FDA at 1-800-FDA-1088. Where should I keep my medicine? This drug is given in a hospital or clinic and will not be stored at home. NOTE: This sheet is a summary. It may not cover all possible information. If you have questions about this medicine, talk to your doctor, pharmacist, or health care provider.  2018 Elsevier/Gold Standard (2011-12-25 16:22:58) Carfilzomib injection What is this medicine? CARFILZOMIB (kar FILZ oh mib) targets a specific protein within cancer cells and stops the cancer cells from growing. It is used to treat multiple myeloma. This medicine may be used for other purposes; ask your health care provider or pharmacist if you have questions. COMMON BRAND NAME(S): KYPROLIS What should I tell my health care provider before I take this medicine? They need to know if you have any of these conditions: -heart disease -history of blood clots -irregular heartbeat -kidney disease -liver disease -lung or breathing disease -an unusual or allergic reaction to carfilzomib, or other medicines, foods, dyes, or preservatives -pregnant or trying to get pregnant -breast-feeding How should I use this medicine? This medicine is for injection or infusion into a vein. It is given  by a health care professional in a hospital or clinic setting. Talk to your pediatrician regarding the use of this medicine in children. Special care may be  needed. Overdosage: If you think you have taken too much of this medicine contact a poison control center or emergency room at once. NOTE: This medicine is only for you. Do not share this medicine with others. What if I miss a dose? It is important not to miss your dose. Call your doctor or health care professional if you are unable to keep an appointment. What may interact with this medicine? Interactions are not expected. Give your health care provider a list of all the medicines, herbs, non-prescription drugs, or dietary supplements you use. Also tell them if you smoke, drink alcohol, or use illegal drugs. Some items may interact with your medicine. This list may not describe all possible interactions. Give your health care provider a list of all the medicines, herbs, non-prescription drugs, or dietary supplements you use. Also tell them if you smoke, drink alcohol, or use illegal drugs. Some items may interact with your medicine. What should I watch for while using this medicine? Your condition will be monitored carefully while you are receiving this medicine. Report any side effects. Continue your course of treatment even though you feel ill unless your doctor tells you to stop. You may need blood work done while you are taking this medicine. Do not become pregnant while taking this medicine or for at least 30 days after stopping it. Women should inform their doctor if they wish to become pregnant or think they might be pregnant. There is a potential for serious side effects to an unborn child. Men should not father a child while taking this medicine and for 90 days after stopping it. Talk to your health care professional or pharmacist for more information. Do not breast-feed an infant while taking this medicine. Check with your doctor or health care professional if you get an attack of severe diarrhea, nausea and vomiting, or if you sweat a lot. The loss of too much body fluid can make it  dangerous for you to take this medicine. You may get dizzy. Do not drive, use machinery, or do anything that needs mental alertness until you know how this medicine affects you. Do not stand or sit up quickly, especially if you are an older patient. This reduces the risk of dizzy or fainting spells. What side effects may I notice from receiving this medicine? Side effects that you should report to your doctor or health care professional as soon as possible: -allergic reactions like skin rash, itching or hives, swelling of the face, lips, or tongue -confusion -dizziness -feeling faint or lightheaded -fever or chills -palpitations -seizures -signs and symptoms of bleeding such as bloody or black, tarry stools; red or dark-brown urine; spitting up blood or brown material that looks like coffee grounds; red spots on the skin; unusual bruising or bleeding including from the eye, gums, or nose -signs and symptoms of a blood clot such as breathing problems; changes in vision; chest pain; severe, sudden headache; pain, swelling, warmth in the leg; trouble speaking; sudden numbness or weakness of the face, arm or leg -signs and symptoms of kidney injury like trouble passing urine or change in the amount of urine -signs and symptoms of liver injury like dark yellow or brown urine; general ill feeling or flu-like symptoms; light-colored stools; loss of appetite; nausea; right upper belly pain; unusually weak or tired; yellowing of the   eyes or skin Side effects that usually do not require medical attention (report to your doctor or health care professional if they continue or are bothersome): -back pain -cough -diarrhea -headache -muscle cramps -vomiting This list may not describe all possible side effects. Call your doctor for medical advice about side effects. You may report side effects to FDA at 1-800-FDA-1088. Where should I keep my medicine? This drug is given in a hospital or clinic and will not  be stored at home. NOTE: This sheet is a summary. It may not cover all possible information. If you have questions about this medicine, talk to your doctor, pharmacist, or health care provider.  2018 Elsevier/Gold Standard (2015-03-14 13:39:23)  

## 2017-05-04 LAB — IGG, IGA, IGM
IGA: 187 mg/dL (ref 61–437)
IgG (Immunoglobin G), Serum: 1184 mg/dL (ref 700–1600)
IgM (Immunoglobulin M), Srm: 25 mg/dL (ref 20–172)

## 2017-05-04 LAB — KAPPA/LAMBDA LIGHT CHAINS
Kappa free light chain: 37.8 mg/L — ABNORMAL HIGH (ref 3.3–19.4)
Kappa, lambda light chain ratio: 1.87 — ABNORMAL HIGH (ref 0.26–1.65)
Lambda free light chains: 20.2 mg/L (ref 5.7–26.3)

## 2017-05-05 LAB — PROTEIN ELECTROPHORESIS, SERUM
A/G RATIO SPE: 1.3 (ref 0.7–1.7)
ALBUMIN ELP: 3.5 g/dL (ref 2.9–4.4)
Alpha-1-Globulin: 0.2 g/dL (ref 0.0–0.4)
Alpha-2-Globulin: 0.8 g/dL (ref 0.4–1.0)
Beta Globulin: 0.8 g/dL (ref 0.7–1.3)
GAMMA GLOBULIN: 1 g/dL (ref 0.4–1.8)
GLOBULIN, TOTAL: 2.8 g/dL (ref 2.2–3.9)
M-Spike, %: 0.3 g/dL — ABNORMAL HIGH
TOTAL PROTEIN ELP: 6.3 g/dL (ref 6.0–8.5)

## 2017-05-07 ENCOUNTER — Other Ambulatory Visit: Payer: Self-pay | Admitting: *Deleted

## 2017-05-07 DIAGNOSIS — C9000 Multiple myeloma not having achieved remission: Secondary | ICD-10-CM

## 2017-05-10 ENCOUNTER — Inpatient Hospital Stay: Payer: Medicare Other

## 2017-05-10 ENCOUNTER — Other Ambulatory Visit: Payer: Self-pay | Admitting: Oncology

## 2017-05-10 ENCOUNTER — Other Ambulatory Visit: Payer: Self-pay

## 2017-05-10 ENCOUNTER — Other Ambulatory Visit: Payer: Self-pay | Admitting: *Deleted

## 2017-05-10 VITALS — BP 137/69 | HR 64 | Temp 98.3°F | Resp 18

## 2017-05-10 DIAGNOSIS — N189 Chronic kidney disease, unspecified: Secondary | ICD-10-CM

## 2017-05-10 DIAGNOSIS — Z299 Encounter for prophylactic measures, unspecified: Secondary | ICD-10-CM

## 2017-05-10 DIAGNOSIS — D631 Anemia in chronic kidney disease: Secondary | ICD-10-CM

## 2017-05-10 DIAGNOSIS — C9000 Multiple myeloma not having achieved remission: Secondary | ICD-10-CM

## 2017-05-10 DIAGNOSIS — M4712 Other spondylosis with myelopathy, cervical region: Secondary | ICD-10-CM

## 2017-05-10 DIAGNOSIS — E349 Endocrine disorder, unspecified: Secondary | ICD-10-CM

## 2017-05-10 DIAGNOSIS — M2550 Pain in unspecified joint: Secondary | ICD-10-CM

## 2017-05-10 DIAGNOSIS — Z5112 Encounter for antineoplastic immunotherapy: Secondary | ICD-10-CM | POA: Diagnosis not present

## 2017-05-10 LAB — CBC WITH DIFFERENTIAL (CANCER CENTER ONLY)
BASOS ABS: 0 10*3/uL (ref 0.0–0.1)
Basophils Relative: 1 %
Eosinophils Absolute: 0.2 10*3/uL (ref 0.0–0.5)
Eosinophils Relative: 3 %
HEMATOCRIT: 34.1 % — AB (ref 38.7–49.9)
HEMOGLOBIN: 10.9 g/dL — AB (ref 13.0–17.1)
Lymphocytes Relative: 18 %
Lymphs Abs: 1.1 10*3/uL (ref 0.9–3.3)
MCH: 25.8 pg — ABNORMAL LOW (ref 28.0–33.4)
MCHC: 32 g/dL (ref 32.0–35.9)
MCV: 80.6 fL — ABNORMAL LOW (ref 82.0–98.0)
MONO ABS: 1 10*3/uL — AB (ref 0.1–0.9)
Monocytes Relative: 16 %
NEUTROS ABS: 3.8 10*3/uL (ref 1.5–6.5)
NEUTROS PCT: 62 %
Platelet Count: 244 10*3/uL (ref 145–400)
RBC: 4.23 MIL/uL (ref 4.20–5.70)
RDW: 18.8 % — AB (ref 11.1–15.7)
WBC Count: 6.1 10*3/uL (ref 4.0–10.0)

## 2017-05-10 LAB — COMPREHENSIVE METABOLIC PANEL
ALT: 25 U/L (ref 10–47)
ANION GAP: 10 (ref 5–15)
AST: 23 U/L (ref 11–38)
Albumin: 3.7 g/dL (ref 3.5–5.0)
Alkaline Phosphatase: 72 U/L (ref 26–84)
BILIRUBIN TOTAL: 0.5 mg/dL (ref 0.2–1.6)
BUN: 28 mg/dL — ABNORMAL HIGH (ref 7–22)
CALCIUM: 9.2 mg/dL (ref 8.0–10.3)
CO2: 27 mmol/L (ref 18–33)
Chloride: 106 mmol/L (ref 98–108)
Creatinine, Ser: 2 mg/dL — ABNORMAL HIGH (ref 0.60–1.20)
Glucose, Bld: 212 mg/dL — ABNORMAL HIGH (ref 73–118)
Potassium: 4.5 mmol/L (ref 3.3–4.7)
Sodium: 143 mmol/L (ref 128–145)
TOTAL PROTEIN: 7.5 g/dL (ref 6.4–8.1)

## 2017-05-10 MED ORDER — SODIUM CHLORIDE 0.9 % IV SOLN
Freq: Once | INTRAVENOUS | Status: AC
  Administered 2017-05-10: 14:00:00 via INTRAVENOUS

## 2017-05-10 MED ORDER — SODIUM CHLORIDE 0.9 % IJ SOLN
10.0000 mL | Freq: Once | INTRAMUSCULAR | Status: AC
  Administered 2017-05-10: 10 mL
  Filled 2017-05-10: qty 10

## 2017-05-10 MED ORDER — DARBEPOETIN ALFA 300 MCG/0.6ML IJ SOSY: 300.0000 ug | PREFILLED_SYRINGE | Freq: Once | INTRAMUSCULAR | Status: DC

## 2017-05-10 MED ORDER — SODIUM CHLORIDE 0.9 % IV SOLN
300.0000 mg/m2 | Freq: Once | INTRAVENOUS | Status: AC
  Administered 2017-05-10: 620 mg via INTRAVENOUS
  Filled 2017-05-10: qty 31

## 2017-05-10 MED ORDER — TESTOSTERONE CYPIONATE 200 MG/ML IM SOLN
INTRAMUSCULAR | Status: AC
  Filled 2017-05-10: qty 2

## 2017-05-10 MED ORDER — SODIUM CHLORIDE 0.9 % IV SOLN
60.0000 mg | Freq: Once | INTRAVENOUS | Status: DC
  Filled 2017-05-10: qty 20

## 2017-05-10 MED ORDER — LORAZEPAM 0.5 MG PO TABS: 0.5000 mg | ORAL_TABLET | Freq: Four times a day (QID) | ORAL | 2 refills | Status: DC | PRN

## 2017-05-10 MED ORDER — DEXAMETHASONE SODIUM PHOSPHATE 10 MG/ML IJ SOLN
10.0000 mg | Freq: Once | INTRAMUSCULAR | Status: AC
  Administered 2017-05-10: 10 mg via INTRAVENOUS

## 2017-05-10 MED ORDER — SODIUM CHLORIDE 0.9 % IV SOLN
Freq: Once | INTRAVENOUS | Status: AC
  Administered 2017-05-10: 08:00:00 via INTRAVENOUS

## 2017-05-10 MED ORDER — SODIUM CHLORIDE 0.9% FLUSH
10.0000 mL | INTRAVENOUS | Status: DC | PRN
  Administered 2017-05-10: 10 mL
  Filled 2017-05-10: qty 10

## 2017-05-10 MED ORDER — TESTOSTERONE CYPIONATE 200 MG/ML IM SOLN
300.0000 mg | INTRAMUSCULAR | Status: DC
  Administered 2017-05-10: 300 mg via INTRAMUSCULAR

## 2017-05-10 MED ORDER — PROCHLORPERAZINE MALEATE 10 MG PO TABS: ORAL_TABLET | ORAL | 1 refills | Status: DC

## 2017-05-10 MED ORDER — HEPARIN SOD (PORK) LOCK FLUSH 100 UNIT/ML IV SOLN
500.0000 [IU] | Freq: Once | INTRAVENOUS | Status: AC | PRN
  Administered 2017-05-10: 500 [IU]
  Filled 2017-05-10: qty 5

## 2017-05-10 MED ORDER — HYDROMORPHONE HCL 4 MG/ML IJ SOLN
INTRAMUSCULAR | Status: AC
  Filled 2017-05-10: qty 1

## 2017-05-10 MED ORDER — DEXAMETHASONE SODIUM PHOSPHATE 10 MG/ML IJ SOLN
INTRAMUSCULAR | Status: AC
  Filled 2017-05-10: qty 1

## 2017-05-10 MED ORDER — PALONOSETRON HCL INJECTION 0.25 MG/5ML
INTRAVENOUS | Status: AC
  Filled 2017-05-10: qty 5

## 2017-05-10 MED ORDER — PALONOSETRON HCL INJECTION 0.25 MG/5ML
0.2500 mg | Freq: Once | INTRAVENOUS | Status: AC
  Administered 2017-05-10: 0.25 mg via INTRAVENOUS

## 2017-05-10 MED ORDER — SODIUM CHLORIDE 0.9 % IV SOLN
60.0000 mg | Freq: Once | INTRAVENOUS | Status: AC
  Administered 2017-05-10: 60 mg via INTRAVENOUS
  Filled 2017-05-10: qty 10

## 2017-05-10 MED ORDER — HYDROMORPHONE HCL 4 MG/ML IJ SOLN
2.0000 mg | Freq: Once | INTRAMUSCULAR | Status: AC
Start: 2017-05-10 — End: 2017-05-10
  Administered 2017-05-10: 2 mg via INTRAVENOUS

## 2017-05-10 MED ORDER — CARFILZOMIB CHEMO INJECTION 60 MG
27.0000 mg/m2 | Freq: Once | INTRAVENOUS | Status: AC
  Administered 2017-05-10: 56 mg via INTRAVENOUS
  Filled 2017-05-10: qty 28

## 2017-05-10 NOTE — Patient Instructions (Signed)
Implanted Port Home Guide An implanted port is a type of central line that is placed under the skin. Central lines are used to provide IV access when treatment or nutrition needs to be given through a person's veins. Implanted ports are used for long-term IV access. An implanted port may be placed because:  You need IV medicine that would be irritating to the small veins in your hands or arms.  You need long-term IV medicines, such as antibiotics.  You need IV nutrition for a long period.  You need frequent blood draws for lab tests.  You need dialysis.  Implanted ports are usually placed in the chest area, but they can also be placed in the upper arm, the abdomen, or the leg. An implanted port has two main parts:  Reservoir. The reservoir is round and will appear as a small, raised area under your skin. The reservoir is the part where a needle is inserted to give medicines or draw blood.  Catheter. The catheter is a thin, flexible tube that extends from the reservoir. The catheter is placed into a large vein. Medicine that is inserted into the reservoir goes into the catheter and then into the vein.  How will I care for my incision site? Do not get the incision site wet. Bathe or shower as directed by your health care provider. How is my port accessed? Special steps must be taken to access the port:  Before the port is accessed, a numbing cream can be placed on the skin. This helps numb the skin over the port site.  Your health care provider uses a sterile technique to access the port. ? Your health care provider must put on a mask and sterile gloves. ? The skin over your port is cleaned carefully with an antiseptic and allowed to dry. ? The port is gently pinched between sterile gloves, and a needle is inserted into the port.  Only "non-coring" port needles should be used to access the port. Once the port is accessed, a blood return should be checked. This helps ensure that the port  is in the vein and is not clogged.  If your port needs to remain accessed for a constant infusion, a clear (transparent) bandage will be placed over the needle site. The bandage and needle will need to be changed every week, or as directed by your health care provider.  Keep the bandage covering the needle clean and dry. Do not get it wet. Follow your health care provider's instructions on how to take a shower or bath while the port is accessed.  If your port does not need to stay accessed, no bandage is needed over the port.  What is flushing? Flushing helps keep the port from getting clogged. Follow your health care provider's instructions on how and when to flush the port. Ports are usually flushed with saline solution or a medicine called heparin. The need for flushing will depend on how the port is used.  If the port is used for intermittent medicines or blood draws, the port will need to be flushed: ? After medicines have been given. ? After blood has been drawn. ? As part of routine maintenance.  If a constant infusion is running, the port may not need to be flushed.  How long will my port stay implanted? The port can stay in for as long as your health care provider thinks it is needed. When it is time for the port to come out, surgery will be   done to remove it. The procedure is similar to the one performed when the port was put in. When should I seek immediate medical care? When you have an implanted port, you should seek immediate medical care if:  You notice a bad smell coming from the incision site.  You have swelling, redness, or drainage at the incision site.  You have more swelling or pain at the port site or the surrounding area.  You have a fever that is not controlled with medicine.  This information is not intended to replace advice given to you by your health care provider. Make sure you discuss any questions you have with your health care provider. Document  Released: 02/09/2005 Document Revised: 07/18/2015 Document Reviewed: 10/17/2012 Elsevier Interactive Patient Education  2017 [employer]  

## 2017-05-10 NOTE — Progress Notes (Signed)
Bloodwork reviewed with Dr. [name].  Ok to treat.

## 2017-05-10 NOTE — Patient Instructions (Signed)
Pamidronate injection What is this medicine? PAMIDRONATE (pa mi DROE nate) slows calcium loss from bones. It is used to treat high calcium blood levels from cancer or Paget's disease. It is also used to treat bone pain and prevent fractures from certain cancers that have spread to the bone. This medicine may be used for other purposes; ask your health care provider or pharmacist if you have questions. COMMON BRAND NAME(S): Aredia What should I tell my health care provider before I take this medicine? They need to know if you have any of these conditions: -aspirin-sensitive asthma -dental disease -kidney disease -an unusual or allergic reaction to pamidronate, other medicines, foods, dyes, or preservatives -pregnant or trying to get pregnant -breast-feeding How should I use this medicine? This medicine is for infusion into a vein. It is given by a health care professional in a hospital or clinic setting. Talk to your pediatrician regarding the use of this medicine in children. This medicine is not approved for use in children. Overdosage: If you think you have taken too much of this medicine contact a poison control center or emergency room at once. NOTE: This medicine is only for you. Do not share this medicine with others. What if I miss a dose? This does not apply. What may interact with this medicine? -certain antibiotics given by injection -medicines for inflammation or pain like ibuprofen, naproxen -some diuretics like bumetanide, furosemide -cyclosporine -parathyroid hormone -tacrolimus -teriparatide -thalidomide This list may not describe all possible interactions. Give your health care provider a list of all the medicines, herbs, non-prescription drugs, or dietary supplements you use. Also tell them if you smoke, drink alcohol, or use illegal drugs. Some items may interact with your medicine. What should I watch for while using this medicine? Visit your doctor or health care  professional for regular checkups. It may be some time before you see the benefit from this medicine. Do not stop taking your medicine unless your doctor tells you to. Your doctor may order blood tests or other tests to see how you are doing. Women should inform their doctor if they wish to become pregnant or think they might be pregnant. There is a potential for serious side effects to an unborn child. Talk to your health care professional or pharmacist for more information. You should make sure that you get enough calcium and vitamin D while you are taking this medicine. Discuss the foods you eat and the vitamins you take with your health care professional. Some people who take this medicine have severe bone, joint, and/or muscle pain. This medicine may also increase your risk for a broken thigh bone. Tell your doctor right away if you have pain in your upper leg or groin. Tell your doctor if you have any pain that does not go away or that gets worse. What side effects may I notice from receiving this medicine? Side effects that you should report to your doctor or health care professional as soon as possible: -allergic reactions like skin rash, itching or hives, swelling of the face, lips, or tongue -black or tarry stools -changes in vision -eye inflammation, pain -high blood pressure -jaw pain, especially burning or cramping -muscle weakness -numb, tingling pain -swelling of feet or hands -trouble passing urine or change in the amount of urine -unable to move easily Side effects that usually do not require medical attention (report to your doctor or health care professional if they continue or are bothersome): -bone, joint, or muscle pain -constipation -dizzy, drowsy -  fever -headache -loss of appetite -nausea, vomiting -pain at site where injected This list may not describe all possible side effects. Call your doctor for medical advice about side effects. You may report side effects to  FDA at 1-800-FDA-1088. Where should I keep my medicine? This drug is given in a hospital or clinic and will not be stored at home. NOTE: This sheet is a summary. It may not cover all possible information. If you have questions about this medicine, talk to your doctor, pharmacist, or health care provider.  2018 Elsevier/Gold Standard (2010-08-08 08:49:49)  

## 2017-05-10 NOTE — Progress Notes (Signed)
Ok to give Aredia today per Dr. [name].   Patient states he has generalized bone pain rating the pain a 10 on the 0 to 10 pain scale. Patient states the Aredia infusion alleviates his bone pain. Dilaudid given as ordered as well.

## 2017-05-12 ENCOUNTER — Other Ambulatory Visit: Payer: Self-pay | Admitting: *Deleted

## 2017-05-12 DIAGNOSIS — C9000 Multiple myeloma not having achieved remission: Secondary | ICD-10-CM

## 2017-05-12 MED ORDER — ONDANSETRON HCL 8 MG PO TABS: 8.0000 mg | ORAL_TABLET | Freq: Two times a day (BID) | ORAL | 1 refills | Status: DC | PRN

## 2017-05-14 ENCOUNTER — Other Ambulatory Visit: Payer: Self-pay | Admitting: *Deleted

## 2017-05-14 DIAGNOSIS — C9000 Multiple myeloma not having achieved remission: Secondary | ICD-10-CM

## 2017-05-17 ENCOUNTER — Inpatient Hospital Stay: Payer: Medicare Other

## 2017-05-17 VITALS — BP 157/60 | HR 80 | Temp 98.3°F | Resp 16

## 2017-05-17 DIAGNOSIS — E349 Endocrine disorder, unspecified: Secondary | ICD-10-CM

## 2017-05-17 DIAGNOSIS — D631 Anemia in chronic kidney disease: Secondary | ICD-10-CM

## 2017-05-17 DIAGNOSIS — C9000 Multiple myeloma not having achieved remission: Secondary | ICD-10-CM

## 2017-05-17 DIAGNOSIS — M4712 Other spondylosis with myelopathy, cervical region: Secondary | ICD-10-CM

## 2017-05-17 DIAGNOSIS — Z299 Encounter for prophylactic measures, unspecified: Secondary | ICD-10-CM

## 2017-05-17 DIAGNOSIS — N189 Chronic kidney disease, unspecified: Secondary | ICD-10-CM

## 2017-05-17 DIAGNOSIS — M2550 Pain in unspecified joint: Secondary | ICD-10-CM

## 2017-05-17 DIAGNOSIS — Z5112 Encounter for antineoplastic immunotherapy: Secondary | ICD-10-CM | POA: Diagnosis not present

## 2017-05-17 LAB — CBC WITH DIFFERENTIAL (CANCER CENTER ONLY)
BASOS PCT: 0 %
Basophils Absolute: 0 10*3/uL (ref 0.0–0.1)
Eosinophils Absolute: 0.2 10*3/uL (ref 0.0–0.5)
Eosinophils Relative: 4 %
HCT: 32.9 % — ABNORMAL LOW (ref 38.7–49.9)
Hemoglobin: 10.8 g/dL — ABNORMAL LOW (ref 13.0–17.1)
Lymphocytes Relative: 17 %
Lymphs Abs: 1 10*3/uL (ref 0.9–3.3)
MCH: 26.3 pg — ABNORMAL LOW (ref 28.0–33.4)
MCHC: 32.8 g/dL (ref 32.0–35.9)
MCV: 80 fL — ABNORMAL LOW (ref 82.0–98.0)
MONO ABS: 0.8 10*3/uL (ref 0.1–0.9)
MONOS PCT: 14 %
Neutro Abs: 4 10*3/uL (ref 1.5–6.5)
Neutrophils Relative %: 65 %
Platelet Count: 189 10*3/uL (ref 145–400)
RBC: 4.11 MIL/uL — ABNORMAL LOW (ref 4.20–5.70)
RDW: 19 % — AB (ref 11.1–15.7)
WBC Count: 6.1 10*3/uL (ref 4.0–10.0)

## 2017-05-17 LAB — CMP (CANCER CENTER ONLY)
ALBUMIN: 3.4 g/dL — AB (ref 3.5–5.0)
ALT: 20 U/L (ref 10–47)
AST: 18 U/L (ref 11–38)
Alkaline Phosphatase: 77 U/L (ref 26–84)
Anion gap: 7 (ref 5–15)
BUN: 26 mg/dL — ABNORMAL HIGH (ref 7–22)
CALCIUM: 9.1 mg/dL (ref 8.0–10.3)
CO2: 29 mmol/L (ref 18–33)
CREATININE: 2.4 mg/dL — AB (ref 0.60–1.20)
Chloride: 105 mmol/L (ref 98–108)
GLUCOSE: 190 mg/dL — AB (ref 73–118)
Potassium: 4.6 mmol/L (ref 3.3–4.7)
Sodium: 141 mmol/L (ref 128–145)
Total Bilirubin: 0.6 mg/dL (ref 0.2–1.6)
Total Protein: 6.6 g/dL (ref 6.4–8.1)

## 2017-05-17 MED ORDER — DARBEPOETIN ALFA 300 MCG/0.6ML IJ SOSY
PREFILLED_SYRINGE | INTRAMUSCULAR | Status: AC
  Filled 2017-05-17: qty 0.6

## 2017-05-17 MED ORDER — HEPARIN SOD (PORK) LOCK FLUSH 100 UNIT/ML IV SOLN
500.0000 [IU] | Freq: Once | INTRAVENOUS | Status: AC | PRN
  Administered 2017-05-17: 500 [IU]
  Filled 2017-05-17: qty 5

## 2017-05-17 MED ORDER — DARBEPOETIN ALFA 300 MCG/0.6ML IJ SOSY
300.0000 ug | PREFILLED_SYRINGE | Freq: Once | INTRAMUSCULAR | Status: AC
  Administered 2017-05-17: 300 ug via SUBCUTANEOUS

## 2017-05-17 MED ORDER — SODIUM CHLORIDE 0.9% FLUSH
10.0000 mL | INTRAVENOUS | Status: DC | PRN
  Administered 2017-05-17: 10 mL
  Filled 2017-05-17: qty 10

## 2017-05-17 MED ORDER — HYDROMORPHONE HCL 4 MG/ML IJ SOLN
INTRAMUSCULAR | Status: AC
  Filled 2017-05-17: qty 1

## 2017-05-17 MED ORDER — ONDANSETRON HCL 4 MG/2ML IJ SOLN
INTRAMUSCULAR | Status: AC
Start: 2017-05-17 — End: 2017-05-17
  Filled 2017-05-17: qty 4

## 2017-05-17 MED ORDER — SODIUM CHLORIDE 0.9 % IV SOLN: Freq: Once | INTRAVENOUS | Status: AC

## 2017-05-17 MED ORDER — PALONOSETRON HCL INJECTION 0.25 MG/5ML
0.2500 mg | Freq: Once | INTRAVENOUS | Status: AC
  Administered 2017-05-17: 0.25 mg via INTRAVENOUS

## 2017-05-17 MED ORDER — SODIUM CHLORIDE 0.9 % IV SOLN
Freq: Once | INTRAVENOUS | Status: AC
  Administered 2017-05-17: 09:00:00 via INTRAVENOUS

## 2017-05-17 MED ORDER — HYDROMORPHONE HCL 4 MG/ML IJ SOLN
2.0000 mg | Freq: Once | INTRAMUSCULAR | Status: AC
  Administered 2017-05-17: 2 mg via INTRAVENOUS

## 2017-05-17 MED ORDER — SODIUM CHLORIDE 0.9 % IV SOLN
300.0000 mg/m2 | Freq: Once | INTRAVENOUS | Status: AC
  Administered 2017-05-17: 620 mg via INTRAVENOUS
  Filled 2017-05-17: qty 31

## 2017-05-17 MED ORDER — DEXAMETHASONE SODIUM PHOSPHATE 10 MG/ML IJ SOLN
INTRAMUSCULAR | Status: AC
  Filled 2017-05-17: qty 1

## 2017-05-17 MED ORDER — DEXTROSE 5 % IV SOLN
60.0000 mg | Freq: Once | INTRAVENOUS | Status: AC
  Administered 2017-05-17: 60 mg via INTRAVENOUS
  Filled 2017-05-17: qty 30

## 2017-05-17 MED ORDER — DEXAMETHASONE SODIUM PHOSPHATE 10 MG/ML IJ SOLN
10.0000 mg | Freq: Once | INTRAMUSCULAR | Status: AC
  Administered 2017-05-17: 10 mg via INTRAVENOUS

## 2017-05-17 NOTE — Patient Instructions (Signed)
Implanted Port Home Guide An implanted port is a type of central line that is placed under the skin. Central lines are used to provide IV access when treatment or nutrition needs to be given through a person's veins. Implanted ports are used for long-term IV access. An implanted port may be placed because:  You need IV medicine that would be irritating to the small veins in your hands or arms.  You need long-term IV medicines, such as antibiotics.  You need IV nutrition for a long period.  You need frequent blood draws for lab tests.  You need dialysis.  Implanted ports are usually placed in the chest area, but they can also be placed in the upper arm, the abdomen, or the leg. An implanted port has two main parts:  Reservoir. The reservoir is round and will appear as a small, raised area under your skin. The reservoir is the part where a needle is inserted to give medicines or draw blood.  Catheter. The catheter is a thin, flexible tube that extends from the reservoir. The catheter is placed into a large vein. Medicine that is inserted into the reservoir goes into the catheter and then into the vein.  How will I care for my incision site? Do not get the incision site wet. Bathe or shower as directed by your health care provider. How is my port accessed? Special steps must be taken to access the port:  Before the port is accessed, a numbing cream can be placed on the skin. This helps numb the skin over the port site.  Your health care provider uses a sterile technique to access the port. ? Your health care provider must put on a mask and sterile gloves. ? The skin over your port is cleaned carefully with an antiseptic and allowed to dry. ? The port is gently pinched between sterile gloves, and a needle is inserted into the port.  Only "non-coring" port needles should be used to access the port. Once the port is accessed, a blood return should be checked. This helps ensure that the port  is in the vein and is not clogged.  If your port needs to remain accessed for a constant infusion, a clear (transparent) bandage will be placed over the needle site. The bandage and needle will need to be changed every week, or as directed by your health care provider.  Keep the bandage covering the needle clean and dry. Do not get it wet. Follow your health care provider's instructions on how to take a shower or bath while the port is accessed.  If your port does not need to stay accessed, no bandage is needed over the port.  What is flushing? Flushing helps keep the port from getting clogged. Follow your health care provider's instructions on how and when to flush the port. Ports are usually flushed with saline solution or a medicine called heparin. The need for flushing will depend on how the port is used.  If the port is used for intermittent medicines or blood draws, the port will need to be flushed: ? After medicines have been given. ? After blood has been drawn. ? As part of routine maintenance.  If a constant infusion is running, the port may not need to be flushed.  How long will my port stay implanted? The port can stay in for as long as your health care provider thinks it is needed. When it is time for the port to come out, surgery will be   done to remove it. The procedure is similar to the one performed when the port was put in. When should I seek immediate medical care? When you have an implanted port, you should seek immediate medical care if:  You notice a bad smell coming from the incision site.  You have swelling, redness, or drainage at the incision site.  You have more swelling or pain at the port site or the surrounding area.  You have a fever that is not controlled with medicine.  This information is not intended to replace advice given to you by your health care provider. Make sure you discuss any questions you have with your health care provider. Document  Released: 02/09/2005 Document Revised: 07/18/2015 Document Reviewed: 10/17/2012 Elsevier Interactive Patient Education  2017 [employer]  

## 2017-05-17 NOTE — Progress Notes (Signed)
OK to treat with creatinine of 2.4 per Dr [name]. dph

## 2017-05-17 NOTE — Patient Instructions (Signed)
Cyclophosphamide injection What is this medicine? CYCLOPHOSPHAMIDE (sye kloe FOSS fa mide) is a chemotherapy drug. It slows the growth of cancer cells. This medicine is used to treat many types of cancer like lymphoma, myeloma, leukemia, breast cancer, and ovarian cancer, to name a few. This medicine may be used for other purposes; ask your health care provider or pharmacist if you have questions. COMMON BRAND NAME(S): Cytoxan, Neosar What should I tell my health care provider before I take this medicine? They need to know if you have any of these conditions: -blood disorders -history of other chemotherapy -infection -kidney disease -liver disease -recent or ongoing radiation therapy -tumors in the bone marrow -an unusual or allergic reaction to cyclophosphamide, other chemotherapy, other medicines, foods, dyes, or preservatives -pregnant or trying to get pregnant -breast-feeding How should I use this medicine? This drug is usually given as an injection into a vein or muscle or by infusion into a vein. It is administered in a hospital or clinic by a specially trained health care professional. Talk to your pediatrician regarding the use of this medicine in children. Special care may be needed. Overdosage: If you think you have taken too much of this medicine contact a poison control center or emergency room at once. NOTE: This medicine is only for you. Do not share this medicine with others. What if I miss a dose? It is important not to miss your dose. Call your doctor or health care professional if you are unable to keep an appointment. What may interact with this medicine? This medicine may interact with the following medications: -amiodarone -amphotericin B -azathioprine -certain antiviral medicines for HIV or AIDS such as protease inhibitors (e.g., indinavir, ritonavir) and zidovudine -certain blood pressure medications such as benazepril, captopril, enalapril, fosinopril,  lisinopril, moexipril, monopril, perindopril, quinapril, ramipril, trandolapril -certain cancer medications such as anthracyclines (e.g., daunorubicin, doxorubicin), busulfan, cytarabine, paclitaxel, pentostatin, tamoxifen, trastuzumab -certain diuretics such as chlorothiazide, chlorthalidone, hydrochlorothiazide, indapamide, metolazone -certain medicines that treat or prevent blood clots like warfarin -certain muscle relaxants such as succinylcholine -cyclosporine -etanercept -indomethacin -medicines to increase blood counts like filgrastim, pegfilgrastim, sargramostim -medicines used as general anesthesia -metronidazole -natalizumab This list may not describe all possible interactions. Give your health care provider a list of all the medicines, herbs, non-prescription drugs, or dietary supplements you use. Also tell them if you smoke, drink alcohol, or use illegal drugs. Some items may interact with your medicine. What should I watch for while using this medicine? Visit your doctor for checks on your progress. This drug may make you feel generally unwell. This is not uncommon, as chemotherapy can affect healthy cells as well as cancer cells. Report any side effects. Continue your course of treatment even though you feel ill unless your doctor tells you to stop. Drink water or other fluids as directed. Urinate often, even at night. In some cases, you may be given additional medicines to help with side effects. Follow all directions for their use. Call your doctor or health care professional for advice if you get a fever, chills or sore throat, or other symptoms of a cold or flu. Do not treat yourself. This drug decreases your body's ability to fight infections. Try to avoid being around people who are sick. This medicine may increase your risk to bruise or bleed. Call your doctor or health care professional if you notice any unusual bleeding. Be careful brushing and flossing your teeth or using a  toothpick because you may get an infection or bleed   more easily. If you have any dental work done, tell your dentist you are receiving this medicine. You may get drowsy or dizzy. Do not drive, use machinery, or do anything that needs mental alertness until you know how this medicine affects you. Do not become pregnant while taking this medicine or for 1 year after stopping it. Women should inform their doctor if they wish to become pregnant or think they might be pregnant. Men should not father a child while taking this medicine and for 4 months after stopping it. There is a potential for serious side effects to an unborn child. Talk to your health care professional or pharmacist for more information. Do not breast-feed an infant while taking this medicine. This medicine may interfere with the ability to have a child. This medicine has caused ovarian failure in some women. This medicine has caused reduced sperm counts in some men. You should talk with your doctor or health care professional if you are concerned about your fertility. If you are going to have surgery, tell your doctor or health care professional that you have taken this medicine. What side effects may I notice from receiving this medicine? Side effects that you should report to your doctor or health care professional as soon as possible: -allergic reactions like skin rash, itching or hives, swelling of the face, lips, or tongue -low blood counts - this medicine may decrease the number of white blood cells, red blood cells and platelets. You may be at increased risk for infections and bleeding. -signs of infection - fever or chills, cough, sore throat, pain or difficulty passing urine -signs of decreased platelets or bleeding - bruising, pinpoint red spots on the skin, black, tarry stools, blood in the urine -signs of decreased red blood cells - unusually weak or tired, fainting spells, lightheadedness -breathing problems -dark  urine -dizziness -palpitations -swelling of the ankles, feet, hands -trouble passing urine or change in the amount of urine -weight gain -yellowing of the eyes or skin Side effects that usually do not require medical attention (report to your doctor or health care professional if they continue or are bothersome): -changes in nail or skin color -hair loss -missed menstrual periods -mouth sores -nausea, vomiting This list may not describe all possible side effects. Call your doctor for medical advice about side effects. You may report side effects to FDA at 1-800-FDA-1088. Where should I keep my medicine? This drug is given in a hospital or clinic and will not be stored at home. NOTE: This sheet is a summary. It may not cover all possible information. If you have questions about this medicine, talk to your doctor, pharmacist, or health care provider.  2018 Elsevier/Gold Standard (2011-12-25 16:22:58) Carfilzomib injection What is this medicine? CARFILZOMIB (kar FILZ oh mib) targets a specific protein within cancer cells and stops the cancer cells from growing. It is used to treat multiple myeloma. This medicine may be used for other purposes; ask your health care provider or pharmacist if you have questions. COMMON BRAND NAME(S): KYPROLIS What should I tell my health care provider before I take this medicine? They need to know if you have any of these conditions: -heart disease -history of blood clots -irregular heartbeat -kidney disease -liver disease -lung or breathing disease -an unusual or allergic reaction to carfilzomib, or other medicines, foods, dyes, or preservatives -pregnant or trying to get pregnant -breast-feeding How should I use this medicine? This medicine is for injection or infusion into a vein. It is given  by a health care professional in a hospital or clinic setting. Talk to your pediatrician regarding the use of this medicine in children. Special care may be  needed. Overdosage: If you think you have taken too much of this medicine contact a poison control center or emergency room at once. NOTE: This medicine is only for you. Do not share this medicine with others. What if I miss a dose? It is important not to miss your dose. Call your doctor or health care professional if you are unable to keep an appointment. What may interact with this medicine? Interactions are not expected. Give your health care provider a list of all the medicines, herbs, non-prescription drugs, or dietary supplements you use. Also tell them if you smoke, drink alcohol, or use illegal drugs. Some items may interact with your medicine. This list may not describe all possible interactions. Give your health care provider a list of all the medicines, herbs, non-prescription drugs, or dietary supplements you use. Also tell them if you smoke, drink alcohol, or use illegal drugs. Some items may interact with your medicine. What should I watch for while using this medicine? Your condition will be monitored carefully while you are receiving this medicine. Report any side effects. Continue your course of treatment even though you feel ill unless your doctor tells you to stop. You may need blood work done while you are taking this medicine. Do not become pregnant while taking this medicine or for at least 30 days after stopping it. Women should inform their doctor if they wish to become pregnant or think they might be pregnant. There is a potential for serious side effects to an unborn child. Men should not father a child while taking this medicine and for 90 days after stopping it. Talk to your health care professional or pharmacist for more information. Do not breast-feed an infant while taking this medicine. Check with your doctor or health care professional if you get an attack of severe diarrhea, nausea and vomiting, or if you sweat a lot. The loss of too much body fluid can make it  dangerous for you to take this medicine. You may get dizzy. Do not drive, use machinery, or do anything that needs mental alertness until you know how this medicine affects you. Do not stand or sit up quickly, especially if you are an older patient. This reduces the risk of dizzy or fainting spells. What side effects may I notice from receiving this medicine? Side effects that you should report to your doctor or health care professional as soon as possible: -allergic reactions like skin rash, itching or hives, swelling of the face, lips, or tongue -confusion -dizziness -feeling faint or lightheaded -fever or chills -palpitations -seizures -signs and symptoms of bleeding such as bloody or black, tarry stools; red or dark-brown urine; spitting up blood or brown material that looks like coffee grounds; red spots on the skin; unusual bruising or bleeding including from the eye, gums, or nose -signs and symptoms of a blood clot such as breathing problems; changes in vision; chest pain; severe, sudden headache; pain, swelling, warmth in the leg; trouble speaking; sudden numbness or weakness of the face, arm or leg -signs and symptoms of kidney injury like trouble passing urine or change in the amount of urine -signs and symptoms of liver injury like dark yellow or brown urine; general ill feeling or flu-like symptoms; light-colored stools; loss of appetite; nausea; right upper belly pain; unusually weak or tired; yellowing of the  eyes or skin Side effects that usually do not require medical attention (report to your doctor or health care professional if they continue or are bothersome): -back pain -cough -diarrhea -headache -muscle cramps -vomiting This list may not describe all possible side effects. Call your doctor for medical advice about side effects. You may report side effects to FDA at 1-800-FDA-1088. Where should I keep my medicine? This drug is given in a hospital or clinic and will not  be stored at home. NOTE: This sheet is a summary. It may not cover all possible information. If you have questions about this medicine, talk to your doctor, pharmacist, or health care provider.  2018 Elsevier/Gold Standard (2015-03-14 13:39:23)

## 2017-05-31 ENCOUNTER — Inpatient Hospital Stay: Payer: Medicare Other

## 2017-05-31 ENCOUNTER — Telehealth: Payer: Self-pay | Admitting: *Deleted

## 2017-05-31 ENCOUNTER — Other Ambulatory Visit: Payer: Self-pay

## 2017-05-31 ENCOUNTER — Inpatient Hospital Stay: Payer: Medicare Other | Attending: Hematology & Oncology | Admitting: Hematology & Oncology

## 2017-05-31 VITALS — BP 145/71 | HR 65 | Temp 98.3°F | Resp 17 | Wt 196.0 lb

## 2017-05-31 DIAGNOSIS — C9000 Multiple myeloma not having achieved remission: Secondary | ICD-10-CM | POA: Insufficient documentation

## 2017-05-31 DIAGNOSIS — D508 Other iron deficiency anemias: Secondary | ICD-10-CM

## 2017-05-31 DIAGNOSIS — Z5112 Encounter for antineoplastic immunotherapy: Secondary | ICD-10-CM | POA: Diagnosis present

## 2017-05-31 DIAGNOSIS — M4712 Other spondylosis with myelopathy, cervical region: Secondary | ICD-10-CM

## 2017-05-31 DIAGNOSIS — M2550 Pain in unspecified joint: Secondary | ICD-10-CM

## 2017-05-31 DIAGNOSIS — D500 Iron deficiency anemia secondary to blood loss (chronic): Secondary | ICD-10-CM

## 2017-05-31 DIAGNOSIS — Z5111 Encounter for antineoplastic chemotherapy: Secondary | ICD-10-CM | POA: Diagnosis present

## 2017-05-31 DIAGNOSIS — D509 Iron deficiency anemia, unspecified: Secondary | ICD-10-CM | POA: Diagnosis not present

## 2017-05-31 LAB — CBC WITH DIFFERENTIAL (CANCER CENTER ONLY)
BASOS ABS: 0 10*3/uL (ref 0.0–0.1)
BASOS PCT: 1 %
Eosinophils Absolute: 0.3 10*3/uL (ref 0.0–0.5)
Eosinophils Relative: 5 %
HEMATOCRIT: 39.3 % (ref 38.7–49.9)
HEMOGLOBIN: 12.7 g/dL — AB (ref 13.0–17.1)
Lymphocytes Relative: 23 %
Lymphs Abs: 1.1 10*3/uL (ref 0.9–3.3)
MCH: 25.9 pg — ABNORMAL LOW (ref 28.0–33.4)
MCHC: 32.3 g/dL (ref 32.0–35.9)
MCV: 80.2 fL — ABNORMAL LOW (ref 82.0–98.0)
MONO ABS: 0.6 10*3/uL (ref 0.1–0.9)
Monocytes Relative: 14 %
NEUTROS ABS: 2.7 10*3/uL (ref 1.5–6.5)
NEUTROS PCT: 57 %
Platelet Count: 250 10*3/uL (ref 145–400)
RBC: 4.9 MIL/uL (ref 4.20–5.70)
RDW: 19.7 % — AB (ref 11.1–15.7)
WBC Count: 4.6 10*3/uL (ref 4.0–10.0)

## 2017-05-31 LAB — CMP (CANCER CENTER ONLY)
ALBUMIN: 3.6 g/dL (ref 3.5–5.0)
ALK PHOS: 73 U/L (ref 26–84)
ALT: 28 U/L (ref 10–47)
ANION GAP: 8 (ref 5–15)
AST: 27 U/L (ref 11–38)
BILIRUBIN TOTAL: 0.7 mg/dL (ref 0.2–1.6)
BUN: 23 mg/dL — ABNORMAL HIGH (ref 7–22)
CALCIUM: 8.9 mg/dL (ref 8.0–10.3)
CO2: 28 mmol/L (ref 18–33)
CREATININE: 2.4 mg/dL — AB (ref 0.60–1.20)
Chloride: 107 mmol/L (ref 98–108)
Glucose, Bld: 159 mg/dL — ABNORMAL HIGH (ref 73–118)
Potassium: 4.4 mmol/L (ref 3.3–4.7)
Sodium: 143 mmol/L (ref 128–145)
TOTAL PROTEIN: 7.5 g/dL (ref 6.4–8.1)

## 2017-05-31 LAB — IRON AND TIBC
IRON: 39 ug/dL — AB (ref 42–163)
SATURATION RATIOS: 18 % — AB (ref 42–163)
TIBC: 218 ug/dL (ref 202–409)
UIBC: 179 ug/dL

## 2017-05-31 LAB — FERRITIN: Ferritin: 738 ng/mL — ABNORMAL HIGH (ref 22–316)

## 2017-05-31 MED ORDER — SODIUM CHLORIDE 0.9 % IV SOLN
300.0000 mg/m2 | Freq: Once | INTRAVENOUS | Status: AC
  Administered 2017-05-31: 620 mg via INTRAVENOUS
  Filled 2017-05-31: qty 31

## 2017-05-31 MED ORDER — HYDROMORPHONE HCL 1 MG/ML IJ SOLN
INTRAMUSCULAR | Status: AC
  Filled 2017-05-31: qty 2

## 2017-05-31 MED ORDER — DEXAMETHASONE SODIUM PHOSPHATE 10 MG/ML IJ SOLN
INTRAMUSCULAR | Status: AC
  Filled 2017-05-31: qty 1

## 2017-05-31 MED ORDER — DEXTROSE 5 % IV SOLN
29.0000 mg/m2 | Freq: Once | INTRAVENOUS | Status: AC
  Administered 2017-05-31: 60 mg via INTRAVENOUS
  Filled 2017-05-31: qty 30

## 2017-05-31 MED ORDER — SODIUM CHLORIDE 0.9% FLUSH
10.0000 mL | INTRAVENOUS | Status: DC | PRN
  Administered 2017-05-31: 10 mL
  Filled 2017-05-31: qty 10

## 2017-05-31 MED ORDER — SODIUM CHLORIDE 0.9 % IV SOLN
Freq: Once | INTRAVENOUS | Status: AC
  Administered 2017-05-31: 09:00:00 via INTRAVENOUS

## 2017-05-31 MED ORDER — DEXAMETHASONE SODIUM PHOSPHATE 10 MG/ML IJ SOLN
10.0000 mg | Freq: Once | INTRAMUSCULAR | Status: AC
  Administered 2017-05-31: 10 mg via INTRAVENOUS

## 2017-05-31 MED ORDER — SODIUM CHLORIDE 0.9 % IV SOLN: Freq: Once | INTRAVENOUS | Status: AC

## 2017-05-31 MED ORDER — HEPARIN SOD (PORK) LOCK FLUSH 100 UNIT/ML IV SOLN
500.0000 [IU] | Freq: Once | INTRAVENOUS | Status: AC | PRN
  Administered 2017-05-31: 500 [IU]
  Filled 2017-05-31: qty 5

## 2017-05-31 MED ORDER — HYDROMORPHONE HCL 4 MG/ML IJ SOLN
2.0000 mg | Freq: Once | INTRAMUSCULAR | Status: AC
  Administered 2017-05-31: 2 mg via INTRAVENOUS

## 2017-05-31 MED ORDER — PALONOSETRON HCL INJECTION 0.25 MG/5ML
INTRAVENOUS | Status: AC
  Filled 2017-05-31: qty 5

## 2017-05-31 MED ORDER — PALONOSETRON HCL INJECTION 0.25 MG/5ML
0.2500 mg | Freq: Once | INTRAVENOUS | Status: AC
Start: 2017-05-31 — End: 2017-05-31
  Administered 2017-05-31: 0.25 mg via INTRAVENOUS

## 2017-05-31 NOTE — Patient Instructions (Signed)
Implanted Port Home Guide An implanted port is a type of central line that is placed under the skin. Central lines are used to provide IV access when treatment or nutrition needs to be given through a person's veins. Implanted ports are used for long-term IV access. An implanted port may be placed because:  You need IV medicine that would be irritating to the small veins in your hands or arms.  You need long-term IV medicines, such as antibiotics.  You need IV nutrition for a long period.  You need frequent blood draws for lab tests.  You need dialysis.  Implanted ports are usually placed in the chest area, but they can also be placed in the upper arm, the abdomen, or the leg. An implanted port has two main parts:  Reservoir. The reservoir is round and will appear as a small, raised area under your skin. The reservoir is the part where a needle is inserted to give medicines or draw blood.  Catheter. The catheter is a thin, flexible tube that extends from the reservoir. The catheter is placed into a large vein. Medicine that is inserted into the reservoir goes into the catheter and then into the vein.  How will I care for my incision site? Do not get the incision site wet. Bathe or shower as directed by your health care provider. How is my port accessed? Special steps must be taken to access the port:  Before the port is accessed, a numbing cream can be placed on the skin. This helps numb the skin over the port site.  Your health care provider uses a sterile technique to access the port. ? Your health care provider must put on a mask and sterile gloves. ? The skin over your port is cleaned carefully with an antiseptic and allowed to dry. ? The port is gently pinched between sterile gloves, and a needle is inserted into the port.  Only "non-coring" port needles should be used to access the port. Once the port is accessed, a blood return should be checked. This helps ensure that the port  is in the vein and is not clogged.  If your port needs to remain accessed for a constant infusion, a clear (transparent) bandage will be placed over the needle site. The bandage and needle will need to be changed every week, or as directed by your health care provider.  Keep the bandage covering the needle clean and dry. Do not get it wet. Follow your health care provider's instructions on how to take a shower or bath while the port is accessed.  If your port does not need to stay accessed, no bandage is needed over the port.  What is flushing? Flushing helps keep the port from getting clogged. Follow your health care provider's instructions on how and when to flush the port. Ports are usually flushed with saline solution or a medicine called heparin. The need for flushing will depend on how the port is used.  If the port is used for intermittent medicines or blood draws, the port will need to be flushed: ? After medicines have been given. ? After blood has been drawn. ? As part of routine maintenance.  If a constant infusion is running, the port may not need to be flushed.  How long will my port stay implanted? The port can stay in for as long as your health care provider thinks it is needed. When it is time for the port to come out, surgery will be   done to remove it. The procedure is similar to the one performed when the port was put in. When should I seek immediate medical care? When you have an implanted port, you should seek immediate medical care if:  You notice a bad smell coming from the incision site.  You have swelling, redness, or drainage at the incision site.  You have more swelling or pain at the port site or the surrounding area.  You have a fever that is not controlled with medicine.  This information is not intended to replace advice given to you by your health care provider. Make sure you discuss any questions you have with your health care provider. Document  Released: 02/09/2005 Document Revised: 07/18/2015 Document Reviewed: 10/17/2012 Elsevier Interactive Patient Education  2017 [employer]  

## 2017-05-31 NOTE — Telephone Encounter (Addendum)
-----   Message from Eliezer Bottom, NP sent at 05/31/2017  1:13 PM EDT ----- Iron low, needs an infusion. Called patient.  He will get his infusion next Monday the 15th when he is here for appt ----- Message ----- From: Interface, Lab In [place] Sent: 05/31/2017   8:29 AM To: Eliezer Bottom, NP

## 2017-05-31 NOTE — Patient Instructions (Signed)
Carfilzomib injection What is this medicine? CARFILZOMIB (kar FILZ oh mib) targets a specific protein within cancer cells and stops the cancer cells from growing. It is used to treat multiple myeloma. This medicine may be used for other purposes; ask your health care provider or pharmacist if you have questions. COMMON BRAND NAME(S): KYPROLIS What should I tell my health care provider before I take this medicine? They need to know if you have any of these conditions: -heart disease -history of blood clots -irregular heartbeat -kidney disease -liver disease -lung or breathing disease -an unusual or allergic reaction to carfilzomib, or other medicines, foods, dyes, or preservatives -pregnant or trying to get pregnant -breast-feeding How should I use this medicine? This medicine is for injection or infusion into a vein. It is given by a health care professional in a hospital or clinic setting. Talk to your pediatrician regarding the use of this medicine in children. Special care may be needed. Overdosage: If you think you have taken too much of this medicine contact a poison control center or emergency room at once. NOTE: This medicine is only for you. Do not share this medicine with others. What if I miss a dose? It is important not to miss your dose. Call your doctor or health care professional if you are unable to keep an appointment. What may interact with this medicine? Interactions are not expected. Give your health care provider a list of all the medicines, herbs, non-prescription drugs, or dietary supplements you use. Also tell them if you smoke, drink alcohol, or use illegal drugs. Some items may interact with your medicine. This list may not describe all possible interactions. Give your health care provider a list of all the medicines, herbs, non-prescription drugs, or dietary supplements you use. Also tell them if you smoke, drink alcohol, or use illegal drugs. Some items may  interact with your medicine. What should I watch for while using this medicine? Your condition will be monitored carefully while you are receiving this medicine. Report any side effects. Continue your course of treatment even though you feel ill unless your doctor tells you to stop. You may need blood work done while you are taking this medicine. Do not become pregnant while taking this medicine or for at least 30 days after stopping it. Women should inform their doctor if they wish to become pregnant or think they might be pregnant. There is a potential for serious side effects to an unborn child. Men should not father a child while taking this medicine and for 90 days after stopping it. Talk to your health care professional or pharmacist for more information. Do not breast-feed an infant while taking this medicine. Check with your doctor or health care professional if you get an attack of severe diarrhea, nausea and vomiting, or if you sweat a lot. The loss of too much body fluid can make it dangerous for you to take this medicine. You may get dizzy. Do not drive, use machinery, or do anything that needs mental alertness until you know how this medicine affects you. Do not stand or sit up quickly, especially if you are an older patient. This reduces the risk of dizzy or fainting spells. What side effects may I notice from receiving this medicine? Side effects that you should report to your doctor or health care professional as soon as possible: -allergic reactions like skin rash, itching or hives, swelling of the face, lips, or tongue -confusion -dizziness -feeling faint or lightheaded -fever or chills -  palpitations -seizures -signs and symptoms of bleeding such as bloody or black, tarry stools; red or dark-brown urine; spitting up blood or brown material that looks like coffee grounds; red spots on the skin; unusual bruising or bleeding including from the eye, gums, or nose -signs and symptoms of  a blood clot such as breathing problems; changes in vision; chest pain; severe, sudden headache; pain, swelling, warmth in the leg; trouble speaking; sudden numbness or weakness of the face, arm or leg -signs and symptoms of kidney injury like trouble passing urine or change in the amount of urine -signs and symptoms of liver injury like dark yellow or brown urine; general ill feeling or flu-like symptoms; light-colored stools; loss of appetite; nausea; right upper belly pain; unusually weak or tired; yellowing of the eyes or skin Side effects that usually do not require medical attention (report to your doctor or health care professional if they continue or are bothersome): -back pain -cough -diarrhea -headache -muscle cramps -vomiting This list may not describe all possible side effects. Call your doctor for medical advice about side effects. You may report side effects to FDA at 1-800-FDA-1088. Where should I keep my medicine? This drug is given in a hospital or clinic and will not be stored at home. NOTE: This sheet is a summary. It may not cover all possible information. If you have questions about this medicine, talk to your doctor, pharmacist, or health care provider.  2018 Elsevier/Gold Standard (2015-03-14 13:39:23)  Cyclophosphamide injection What is this medicine? CYCLOPHOSPHAMIDE (sye kloe FOSS fa mide) is a chemotherapy drug. It slows the growth of cancer cells. This medicine is used to treat many types of cancer like lymphoma, myeloma, leukemia, breast cancer, and ovarian cancer, to name a few. This medicine may be used for other purposes; ask your health care provider or pharmacist if you have questions. COMMON BRAND NAME(S): Cytoxan, Neosar What should I tell my health care provider before I take this medicine? They need to know if you have any of these conditions: -blood disorders -history of other chemotherapy -infection -kidney disease -liver disease -recent or ongoing  radiation therapy -tumors in the bone marrow -an unusual or allergic reaction to cyclophosphamide, other chemotherapy, other medicines, foods, dyes, or preservatives -pregnant or trying to get pregnant -breast-feeding How should I use this medicine? This drug is usually given as an injection into a vein or muscle or by infusion into a vein. It is administered in a hospital or clinic by a specially trained health care professional. Talk to your pediatrician regarding the use of this medicine in children. Special care may be needed. Overdosage: If you think you have taken too much of this medicine contact a poison control center or emergency room at once. NOTE: This medicine is only for you. Do not share this medicine with others. What if I miss a dose? It is important not to miss your dose. Call your doctor or health care professional if you are unable to keep an appointment. What may interact with this medicine? This medicine may interact with the following medications: -amiodarone -amphotericin B -azathioprine -certain antiviral medicines for HIV or AIDS such as protease inhibitors (e.g., indinavir, ritonavir) and zidovudine -certain blood pressure medications such as benazepril, captopril, enalapril, fosinopril, lisinopril, moexipril, monopril, perindopril, quinapril, ramipril, trandolapril -certain cancer medications such as anthracyclines (e.g., daunorubicin, doxorubicin), busulfan, cytarabine, paclitaxel, pentostatin, tamoxifen, trastuzumab -certain diuretics such as chlorothiazide, chlorthalidone, hydrochlorothiazide, indapamide, metolazone -certain medicines that treat or prevent blood clots like warfarin -certain   muscle relaxants such as succinylcholine -cyclosporine -etanercept -indomethacin -medicines to increase blood counts like filgrastim, pegfilgrastim, sargramostim -medicines used as general anesthesia -metronidazole -natalizumab This list may not describe all possible  interactions. Give your health care provider a list of all the medicines, herbs, non-prescription drugs, or dietary supplements you use. Also tell them if you smoke, drink alcohol, or use illegal drugs. Some items may interact with your medicine. What should I watch for while using this medicine? Visit your doctor for checks on your progress. This drug may make you feel generally unwell. This is not uncommon, as chemotherapy can affect healthy cells as well as cancer cells. Report any side effects. Continue your course of treatment even though you feel ill unless your doctor tells you to stop. Drink water or other fluids as directed. Urinate often, even at night. In some cases, you may be given additional medicines to help with side effects. Follow all directions for their use. Call your doctor or health care professional for advice if you get a fever, chills or sore throat, or other symptoms of a cold or flu. Do not treat yourself. This drug decreases your body's ability to fight infections. Try to avoid being around people who are sick. This medicine may increase your risk to bruise or bleed. Call your doctor or health care professional if you notice any unusual bleeding. Be careful brushing and flossing your teeth or using a toothpick because you may get an infection or bleed more easily. If you have any dental work done, tell your dentist you are receiving this medicine. You may get drowsy or dizzy. Do not drive, use machinery, or do anything that needs mental alertness until you know how this medicine affects you. Do not become pregnant while taking this medicine or for 1 year after stopping it. Women should inform their doctor if they wish to become pregnant or think they might be pregnant. Men should not father a child while taking this medicine and for 4 months after stopping it. There is a potential for serious side effects to an unborn child. Talk to your health care professional or pharmacist for  more information. Do not breast-feed an infant while taking this medicine. This medicine may interfere with the ability to have a child. This medicine has caused ovarian failure in some women. This medicine has caused reduced sperm counts in some men. You should talk with your doctor or health care professional if you are concerned about your fertility. If you are going to have surgery, tell your doctor or health care professional that you have taken this medicine. What side effects may I notice from receiving this medicine? Side effects that you should report to your doctor or health care professional as soon as possible: -allergic reactions like skin rash, itching or hives, swelling of the face, lips, or tongue -low blood counts - this medicine may decrease the number of white blood cells, red blood cells and platelets. You may be at increased risk for infections and bleeding. -signs of infection - fever or chills, cough, sore throat, pain or difficulty passing urine -signs of decreased platelets or bleeding - bruising, pinpoint red spots on the skin, black, tarry stools, blood in the urine -signs of decreased red blood cells - unusually weak or tired, fainting spells, lightheadedness -breathing problems -dark urine -dizziness -palpitations -swelling of the ankles, feet, hands -trouble passing urine or change in the amount of urine -weight gain -yellowing of the eyes or skin Side effects that   usually do not require medical attention (report to your doctor or health care professional if they continue or are bothersome): -changes in nail or skin color -hair loss -missed menstrual periods -mouth sores -nausea, vomiting This list may not describe all possible side effects. Call your doctor for medical advice about side effects. You may report side effects to FDA at 1-800-FDA-1088. Where should I keep my medicine? This drug is given in a hospital or clinic and will not be stored at  home. NOTE: This sheet is a summary. It may not cover all possible information. If you have questions about this medicine, talk to your doctor, pharmacist, or health care provider.  2018 Elsevier/Gold Standard (2011-12-25 16:22:58)  

## 2017-05-31 NOTE — Progress Notes (Signed)
Hematology and Oncology Follow Up Visit  Massie Mees [id number] 12-14-1950 67 y.o. 05/31/2017   Principle Diagnosis:  IgG kappa myeloma Anemia secondary to renal insufficiency Intermittent iron - deficiency anemia Hypotestosteronemia  Current Therapy:   Aredia 60 mg IV q 2 months  Aranesp 300 mcg subcu as needed for hemoglobin less than 11 Depo-Testosterone 300 mg q 4 weeks IV iron as indicated Kyprolis/Cytoxan - s/p cycle #5   Interim History:  Mr. Ramsburg is here today for follow-up and treatment. He is feeling fatigue and having increased lower back and joint pain. He states that he is not taking the Embeda. He states that he is taking Oxycodone and Percocet as directed for degenerative disc disease in the lumbar spine. He does not feel that this is managing his pain well. This is managed by his PCP Dr. Jonelle Sidle so we will have him follow-up back up with that in his office.   He tries to stay up and moving around. It is hard for him to sit for long due to the discomfort.   In March, his M-spike was down to 0.3, IgG level 1,184 mg/dL and kappa light chain 3.8 mg/dL.   He has chronic constipation and takes Miralax as needed.   The neuropathy in his feet is unchanged. No swelling in his extremities at this time.   His appetite comes and goes. He is drinking a lot of Ensure. He feels like he is hydrating well. His weight is stable.   ECOG Performance Status: 1 - Symptomatic but completely ambulatory  Medications:  Allergies as of 05/31/2017      Reactions   Contrast Media [iodinated Diagnostic Agents]    Patient states he was instructed not to take IV contrast.  In 2008 he had an unknown reaction, and was told not to take it again.  He was also told not to take it due to his kidneys.   Iodine Anxiety, Rash, Other (See Comments)   Didn't feel right "instructed not to take per MD--something with his port" Didn't feel right      Medication List        Accurate as of 05/31/17   8:55 AM. Always use your most recent med list.          amLODipine 10 MG tablet Commonly known as:  NORVASC Take 10 mg by mouth daily.   CARAFATE 1 GM/10ML suspension Generic drug:  sucralfate Take 1 g by mouth 4 (four) times daily.   CLEAR EYES ALL SEASONS 5-6 MG/ML Soln Generic drug:  Polyvinyl Alcohol-Povidone Place 2 drops into both eyes 3 (three) times daily as needed (drye eyes.).   dicyclomine 10 MG capsule Commonly known as:  BENTYL Take 1 capsule (10 mg total) by mouth 3 (three) times daily.   EMBEDA 20-0.8 MG Cpcr Generic drug:  Morphine-Naltrexone Take 1 capsule by mouth 3 (three) times daily.   EMBEDA 30-1.2 MG Cpcr Generic drug:  Morphine-Naltrexone Take 3 (three) times daily by mouth.   furosemide 40 MG tablet Commonly known as:  LASIX Take 40 mg by mouth.   glipiZIDE 5 MG tablet Commonly known as:  GLUCOTROL Take 5 mg by mouth 2 (two) times daily before a meal.   isosorbide mononitrate 30 MG 24 hr tablet Commonly known as:  IMDUR Take 30 mg by mouth daily.   latanoprost 0.005 % ophthalmic solution Commonly known as:  XALATAN Place 1 drop into the right eye every evening.   lidocaine-prilocaine cream Commonly known as:  EMLA  Apply 1 application topically as needed.   linaclotide 145 MCG Caps capsule Commonly known as:  LINZESS Take 1 capsule (145 mcg total) by mouth daily before breakfast.   LORazepam 0.5 MG tablet Commonly known as:  ATIVAN Take 1 tablet (0.5 mg total) by mouth every 6 (six) hours as needed (Nausea or vomiting).   losartan 25 MG tablet Commonly known as:  COZAAR Take 25 mg by mouth.   meclizine 50 MG tablet Commonly known as:  ANTIVERT Take 0.5 tablets (25 mg total) by mouth 4 (four) times daily as needed for dizziness.   methocarbamol 500 MG tablet Commonly known as:  ROBAXIN Take 500 mg by mouth 4 (four) times daily.   mirabegron ER 50 MG Tb24 tablet Commonly known as:  MYRBETRIQ Take 50 mg by mouth daily.     omeprazole 40 MG capsule Commonly known as:  PRILOSEC take 1 capsule by mouth twice a day   ondansetron 4 MG disintegrating tablet Commonly known as:  ZOFRAN ODT Take 1 tablet (4 mg total) by mouth every 8 (eight) hours as needed for nausea or vomiting.   ondansetron 8 MG tablet Commonly known as:  ZOFRAN Take 1 tablet (8 mg total) by mouth 2 (two) times daily as needed (Nausea or vomiting).   orphenadrine 100 MG tablet Commonly known as:  NORFLEX Take 1 tablet (100 mg total) by mouth 2 (two) times daily.   oxyCODONE 20 mg 12 hr tablet Commonly known as:  OXYCONTIN Take 1 tablet (20 mg total) by mouth every 12 (twelve) hours.   oxyCODONE-acetaminophen 10-325 MG tablet Commonly known as:  PERCOCET Take 1 tablet by mouth every 6 (six) hours as needed for pain.   potassium chloride SA 20 MEQ tablet Commonly known as:  K-DUR,KLOR-CON Take 1 tablet (20 mEq total) by mouth 2 (two) times daily.   prochlorperazine 10 MG tablet Commonly known as:  COMPAZINE take 1 tablet by mouth every 6 hours if needed for nausea and vomiting   TOPROL XL 50 MG 24 hr tablet Generic drug:  metoprolol succinate Take 50 mg by mouth daily.   Vitamin D (Ergocalciferol) 50000 units Caps capsule Commonly known as:  DRISDOL Take 50,000 Units by mouth every Saturday.   ZIOPTAN 0.0015 % Soln Generic drug:  Tafluprost Place 1 drop into both eyes daily.       Allergies:  Allergies  Allergen Reactions  . Contrast Media [Iodinated Diagnostic Agents]     Patient states he was instructed not to take IV contrast.  In 2008 he had an unknown reaction, and was told not to take it again.  He was also told not to take it due to his kidneys.  . Iodine Anxiety, Rash and Other (See Comments)    Didn't feel right "instructed not to take per MD--something with his port" Didn't feel right    Past Medical History, Surgical history, Social history, and Family History were reviewed and updated.  Review of  Systems: All other 10 point review of systems is negative.   Physical Exam:  weight is 196 lb (88.9 kg). His oral temperature is 98.3 F (36.8 C). His blood pressure is 145/71 (abnormal) and his pulse is 65. His respiration is 17 and oxygen saturation is 100%.   Wt Readings from Last 3 Encounters:  05/31/17 196 lb (88.9 kg)  05/03/17 198 lb (89.8 kg)  04/12/17 198 lb (89.8 kg)    Ocular: Sclerae unicteric, pupils equal, round and reactive to light Ear-nose-throat: Oropharynx clear,  dentition fair Lymphatic: No cervical, supraclavicular or axillary adenopathy Lungs no rales or rhonchi, good excursion bilaterally Heart regular rate and rhythm, no murmur appreciated Abd soft, nontender, positive bowel sounds, no liver or spleen tip palpated on exam, no fluid wave  MSK no focal spinal tenderness, no joint edema Neuro: non-focal, well-oriented, appropriate affect Breasts: Deferred   Lab Results  Component Value Date   WBC 4.6 05/31/2017   HGB 11.9 (L) 02/22/2017   HCT 39.3 05/31/2017   MCV 80.2 (L) 05/31/2017   PLT 250 05/31/2017   Lab Results  Component Value Date   FERRITIN 1,290 (H) 05/03/2017   IRON 63 05/03/2017   TIBC 206 05/03/2017   UIBC 143 05/03/2017   IRONPCTSAT 30 (L) 05/03/2017   Lab Results  Component Value Date   RETICCTPCT 0.6 03/21/2014   RBC 4.90 05/31/2017   RETICCTABS 27.5 03/21/2014   Lab Results  Component Value Date   KPAFRELGTCHN 37.8 (H) 05/03/2017   LAMBDASER 20.2 05/03/2017   KAPLAMBRATIO 1.87 (H) 05/03/2017   Lab Results  Component Value Date   IGGSERUM 1,184 05/03/2017   IGA 187 05/03/2017   IGMSERUM 25 05/03/2017   Lab Results  Component Value Date   TOTALPROTELP 6.3 05/03/2017   ALBUMINELP 3.5 05/03/2017   A1GS 0.2 05/03/2017   A2GS 0.8 05/03/2017   BETS 0.8 05/03/2017   BETA2SER 0.5 11/07/2014   GAMS 1.0 05/03/2017   MSPIKE 0.3 (H) 05/03/2017   SPEI Comment 05/03/2017     Chemistry      Component Value Date/Time   NA  143 05/31/2017 0810   NA 143 02/22/2017 0744   K 4.4 05/31/2017 0810   K 4.0 02/22/2017 0744   CL 107 05/31/2017 0810   CL 106 02/22/2017 0744   CO2 28 05/31/2017 0810   CO2 27 02/22/2017 0744   BUN 23 (H) 05/31/2017 0810   BUN 26 (H) 02/22/2017 0744   CREATININE 2.40 (H) 05/31/2017 0810   CREATININE 2.3 (H) 02/22/2017 0744      Component Value Date/Time   CALCIUM 8.9 05/31/2017 0810   CALCIUM 9.3 02/22/2017 0744   ALKPHOS 73 05/31/2017 0810   ALKPHOS 80 02/22/2017 0744   AST 27 05/31/2017 0810   ALT 28 05/31/2017 0810   ALT 22 02/22/2017 0744   BILITOT 0.7 05/31/2017 0810      Impression and Plan: Mr. Henery is a very pleasant 67 yo African American gentleman with IgG kappa myeloma. His complaint today is persistent lower back and joint pain not managed with his current pain medication regimen. This is followed by his PCP so I will route my note to them today and Mr. Winship was also encouraged to follow-up with their office.   I am very impressed with how well his M spike is come down.  His renal function is on the higher side but stable.  We will have to watch this.  If we continue to see improvement in his myeloma numbers, then we will see about changing his treatments to every 2 weeks.  I will have him come back to see me in 1 month.     Volanda Napoleon, MD 4/8/20198:55 AM\

## 2017-06-01 LAB — IGG, IGA, IGM
IGA: 192 mg/dL (ref 61–437)
IGG (IMMUNOGLOBIN G), SERUM: 1245 mg/dL (ref 700–1600)
IGM (IMMUNOGLOBULIN M), SRM: 27 mg/dL (ref 20–172)

## 2017-06-01 LAB — KAPPA/LAMBDA LIGHT CHAINS
KAPPA, LAMDA LIGHT CHAIN RATIO: 1.57 (ref 0.26–1.65)
Kappa free light chain: 33.5 mg/L — ABNORMAL HIGH (ref 3.3–19.4)
LAMDA FREE LIGHT CHAINS: 21.4 mg/L (ref 5.7–26.3)

## 2017-06-02 LAB — PROTEIN ELECTROPHORESIS, SERUM
A/G Ratio: 1.3 (ref 0.7–1.7)
Albumin ELP: 3.9 g/dL (ref 2.9–4.4)
Alpha-1-Globulin: 0.2 g/dL (ref 0.0–0.4)
Alpha-2-Globulin: 0.9 g/dL (ref 0.4–1.0)
Beta Globulin: 0.9 g/dL (ref 0.7–1.3)
Gamma Globulin: 1.1 g/dL (ref 0.4–1.8)
Globulin, Total: 3.1 g/dL (ref 2.2–3.9)
M-Spike, %: 0.6 g/dL — ABNORMAL HIGH
Total Protein ELP: 7 g/dL (ref 6.0–8.5)

## 2017-06-04 ENCOUNTER — Other Ambulatory Visit: Payer: Self-pay

## 2017-06-04 DIAGNOSIS — C9000 Multiple myeloma not having achieved remission: Secondary | ICD-10-CM

## 2017-06-07 ENCOUNTER — Other Ambulatory Visit: Payer: Self-pay

## 2017-06-07 ENCOUNTER — Inpatient Hospital Stay: Payer: Medicare Other

## 2017-06-07 ENCOUNTER — Other Ambulatory Visit: Payer: Self-pay | Admitting: Family

## 2017-06-07 VITALS — BP 149/62 | HR 65 | Temp 98.3°F | Resp 18 | Wt 193.0 lb

## 2017-06-07 DIAGNOSIS — C9000 Multiple myeloma not having achieved remission: Secondary | ICD-10-CM

## 2017-06-07 DIAGNOSIS — E349 Endocrine disorder, unspecified: Secondary | ICD-10-CM

## 2017-06-07 DIAGNOSIS — Z5112 Encounter for antineoplastic immunotherapy: Secondary | ICD-10-CM | POA: Diagnosis not present

## 2017-06-07 DIAGNOSIS — M2550 Pain in unspecified joint: Secondary | ICD-10-CM

## 2017-06-07 DIAGNOSIS — D631 Anemia in chronic kidney disease: Secondary | ICD-10-CM

## 2017-06-07 DIAGNOSIS — M4712 Other spondylosis with myelopathy, cervical region: Secondary | ICD-10-CM

## 2017-06-07 DIAGNOSIS — Z299 Encounter for prophylactic measures, unspecified: Secondary | ICD-10-CM

## 2017-06-07 DIAGNOSIS — N189 Chronic kidney disease, unspecified: Secondary | ICD-10-CM

## 2017-06-07 LAB — CMP (CANCER CENTER ONLY)
ALK PHOS: 74 U/L (ref 26–84)
ALT: 25 U/L (ref 10–47)
AST: 18 U/L (ref 11–38)
Albumin: 3.5 g/dL (ref 3.5–5.0)
Anion gap: 10 (ref 5–15)
BILIRUBIN TOTAL: 0.7 mg/dL (ref 0.2–1.6)
BUN: 41 mg/dL — ABNORMAL HIGH (ref 7–22)
CO2: 27 mmol/L (ref 18–33)
Calcium: 8.8 mg/dL (ref 8.0–10.3)
Chloride: 108 mmol/L (ref 98–108)
Creatinine: 2.7 mg/dL — ABNORMAL HIGH (ref 0.60–1.20)
Glucose, Bld: 207 mg/dL — ABNORMAL HIGH (ref 73–118)
POTASSIUM: 4.3 mmol/L (ref 3.3–4.7)
SODIUM: 145 mmol/L (ref 128–145)
Total Protein: 7.1 g/dL (ref 6.4–8.1)

## 2017-06-07 LAB — CBC WITH DIFFERENTIAL (CANCER CENTER ONLY)
BASOS PCT: 1 %
Basophils Absolute: 0 10*3/uL (ref 0.0–0.1)
EOS ABS: 0.2 10*3/uL (ref 0.0–0.5)
Eosinophils Relative: 4 %
HEMATOCRIT: 37.5 % — AB (ref 38.7–49.9)
HEMOGLOBIN: 12.4 g/dL — AB (ref 13.0–17.1)
LYMPHS ABS: 0.7 10*3/uL — AB (ref 0.9–3.3)
Lymphocytes Relative: 18 %
MCH: 26.1 pg — AB (ref 28.0–33.4)
MCHC: 33.1 g/dL (ref 32.0–35.9)
MCV: 78.8 fL — ABNORMAL LOW (ref 82.0–98.0)
MONO ABS: 0.7 10*3/uL (ref 0.1–0.9)
MONOS PCT: 17 %
Neutro Abs: 2.4 10*3/uL (ref 1.5–6.5)
Neutrophils Relative %: 60 %
Platelet Count: 181 10*3/uL (ref 145–400)
RBC: 4.76 MIL/uL (ref 4.20–5.70)
RDW: 18.7 % — AB (ref 11.1–15.7)
WBC Count: 4 10*3/uL (ref 4.0–10.0)

## 2017-06-07 MED ORDER — DEXAMETHASONE SODIUM PHOSPHATE 10 MG/ML IJ SOLN
INTRAMUSCULAR | Status: AC
  Filled 2017-06-07: qty 1

## 2017-06-07 MED ORDER — DEXAMETHASONE SODIUM PHOSPHATE 10 MG/ML IJ SOLN
10.0000 mg | Freq: Once | INTRAMUSCULAR | Status: AC
  Administered 2017-06-07: 10 mg via INTRAVENOUS

## 2017-06-07 MED ORDER — PALONOSETRON HCL INJECTION 0.25 MG/5ML
INTRAVENOUS | Status: AC
  Filled 2017-06-07: qty 5

## 2017-06-07 MED ORDER — SODIUM CHLORIDE 0.9 % IV SOLN
510.0000 mg | Freq: Once | INTRAVENOUS | Status: AC
  Administered 2017-06-07: 510 mg via INTRAVENOUS
  Filled 2017-06-07: qty 17

## 2017-06-07 MED ORDER — SODIUM CHLORIDE 0.9 % IV SOLN
60.0000 mg | Freq: Once | INTRAVENOUS | Status: AC
  Administered 2017-06-07: 60 mg via INTRAVENOUS
  Filled 2017-06-07: qty 10

## 2017-06-07 MED ORDER — PALONOSETRON HCL INJECTION 0.25 MG/5ML
0.2500 mg | Freq: Once | INTRAVENOUS | Status: AC
  Administered 2017-06-07: 0.25 mg via INTRAVENOUS

## 2017-06-07 MED ORDER — SODIUM CHLORIDE 0.9 % IV SOLN
300.0000 mg/m2 | Freq: Once | INTRAVENOUS | Status: AC
  Administered 2017-06-07: 620 mg via INTRAVENOUS
  Filled 2017-06-07: qty 31

## 2017-06-07 MED ORDER — HYDROMORPHONE HCL 1 MG/ML IJ SOLN
2.0000 mg | Freq: Once | INTRAMUSCULAR | Status: AC
  Administered 2017-06-07: 2 mg via INTRAVENOUS

## 2017-06-07 MED ORDER — HYDROMORPHONE HCL 1 MG/ML IJ SOLN
INTRAMUSCULAR | Status: AC
  Filled 2017-06-07: qty 2

## 2017-06-07 MED ORDER — SODIUM CHLORIDE 0.9% FLUSH
10.0000 mL | INTRAVENOUS | Status: DC | PRN
  Administered 2017-06-07: 10 mL
  Filled 2017-06-07: qty 10

## 2017-06-07 MED ORDER — SODIUM CHLORIDE 0.9 % IV SOLN: Freq: Once | INTRAVENOUS | Status: AC

## 2017-06-07 MED ORDER — SODIUM CHLORIDE 0.9 % IV SOLN
Freq: Once | INTRAVENOUS | Status: AC
  Administered 2017-06-07: 09:00:00 via INTRAVENOUS

## 2017-06-07 MED ORDER — HEPARIN SOD (PORK) LOCK FLUSH 100 UNIT/ML IV SOLN
500.0000 [IU] | Freq: Once | INTRAVENOUS | Status: AC | PRN
  Administered 2017-06-07: 500 [IU]
  Filled 2017-06-07: qty 5

## 2017-06-07 MED ORDER — PALONOSETRON HCL INJECTION 0.25 MG/5ML
INTRAVENOUS | Status: AC
Start: 2017-06-07 — End: 2017-06-07
  Filled 2017-06-07: qty 5

## 2017-06-07 MED ORDER — SODIUM CHLORIDE 0.9 % IJ SOLN
10.0000 mL | Freq: Once | INTRAMUSCULAR | Status: AC
  Administered 2017-06-07: 10 mL
  Filled 2017-06-07: qty 10

## 2017-06-07 MED ORDER — DEXTROSE 5 % IV SOLN
60.0000 mg | Freq: Once | INTRAVENOUS | Status: AC
  Administered 2017-06-07: 60 mg via INTRAVENOUS
  Filled 2017-06-07: qty 30

## 2017-06-07 NOTE — Progress Notes (Signed)
Patient complains of generalized bone pain rating the pain a 10 on the 0 to 10 pain scale. Dilaudid given.   Ok to treat with elevated creatinine and BUN today per Dr. [name].

## 2017-06-07 NOTE — Patient Instructions (Signed)
Implanted Port Home Guide An implanted port is a type of central line that is placed under the skin. Central lines are used to provide IV access when treatment or nutrition needs to be given through a person's veins. Implanted ports are used for long-term IV access. An implanted port may be placed because:  You need IV medicine that would be irritating to the small veins in your hands or arms.  You need long-term IV medicines, such as antibiotics.  You need IV nutrition for a long period.  You need frequent blood draws for lab tests.  You need dialysis.  Implanted ports are usually placed in the chest area, but they can also be placed in the upper arm, the abdomen, or the leg. An implanted port has two main parts:  Reservoir. The reservoir is round and will appear as a small, raised area under your skin. The reservoir is the part where a needle is inserted to give medicines or draw blood.  Catheter. The catheter is a thin, flexible tube that extends from the reservoir. The catheter is placed into a large vein. Medicine that is inserted into the reservoir goes into the catheter and then into the vein.  How will I care for my incision site? Do not get the incision site wet. Bathe or shower as directed by your health care provider. How is my port accessed? Special steps must be taken to access the port:  Before the port is accessed, a numbing cream can be placed on the skin. This helps numb the skin over the port site.  Your health care provider uses a sterile technique to access the port. ? Your health care provider must put on a mask and sterile gloves. ? The skin over your port is cleaned carefully with an antiseptic and allowed to dry. ? The port is gently pinched between sterile gloves, and a needle is inserted into the port.  Only "non-coring" port needles should be used to access the port. Once the port is accessed, a blood return should be checked. This helps ensure that the port  is in the vein and is not clogged.  If your port needs to remain accessed for a constant infusion, a clear (transparent) bandage will be placed over the needle site. The bandage and needle will need to be changed every week, or as directed by your health care provider.  Keep the bandage covering the needle clean and dry. Do not get it wet. Follow your health care provider's instructions on how to take a shower or bath while the port is accessed.  If your port does not need to stay accessed, no bandage is needed over the port.  What is flushing? Flushing helps keep the port from getting clogged. Follow your health care provider's instructions on how and when to flush the port. Ports are usually flushed with saline solution or a medicine called heparin. The need for flushing will depend on how the port is used.  If the port is used for intermittent medicines or blood draws, the port will need to be flushed: ? After medicines have been given. ? After blood has been drawn. ? As part of routine maintenance.  If a constant infusion is running, the port may not need to be flushed.  How long will my port stay implanted? The port can stay in for as long as your health care provider thinks it is needed. When it is time for the port to come out, surgery will be   done to remove it. The procedure is similar to the one performed when the port was put in. When should I seek immediate medical care? When you have an implanted port, you should seek immediate medical care if:  You notice a bad smell coming from the incision site.  You have swelling, redness, or drainage at the incision site.  You have more swelling or pain at the port site or the surrounding area.  You have a fever that is not controlled with medicine.  This information is not intended to replace advice given to you by your health care provider. Make sure you discuss any questions you have with your health care provider. Document  Released: 02/09/2005 Document Revised: 07/18/2015 Document Reviewed: 10/17/2012 Elsevier Interactive Patient Education  2017 [employer]  

## 2017-06-07 NOTE — Patient Instructions (Addendum)
[facility] Discharge Instructions for Patients Receiving Chemotherapy  Today you received the following chemotherapy agents Cytoxan/Kyprolis To help prevent nausea and vomiting after your treatment, we encourage you to take your nausea medication as prescribed.   If you develop nausea and vomiting that is not controlled by your nausea medication, call the clinic.   BELOW ARE SYMPTOMS THAT SHOULD BE REPORTED IMMEDIATELY:  *FEVER GREATER THAN 100.5 F  *CHILLS WITH OR WITHOUT FEVER  NAUSEA AND VOMITING THAT IS NOT CONTROLLED WITH YOUR NAUSEA MEDICATION  *UNUSUAL SHORTNESS OF BREATH  *UNUSUAL BRUISING OR BLEEDING  TENDERNESS IN MOUTH AND THROAT WITH OR WITHOUT PRESENCE OF ULCERS  *URINARY PROBLEMS  *BOWEL PROBLEMS  UNUSUAL RASH Items with * indicate a potential emergency and should be followed up as soon as possible.  Feel free to call the clinic should you have any questions or concerns. The clinic phone number is [phone].  Please show the [facility] at check-in to the Emergency Department and triage nurse.

## 2017-06-14 ENCOUNTER — Other Ambulatory Visit: Payer: Self-pay | Admitting: *Deleted

## 2017-06-14 ENCOUNTER — Inpatient Hospital Stay: Payer: Medicare Other

## 2017-06-14 VITALS — BP 139/72 | HR 60 | Temp 98.0°F | Resp 18

## 2017-06-14 DIAGNOSIS — C9000 Multiple myeloma not having achieved remission: Secondary | ICD-10-CM

## 2017-06-14 DIAGNOSIS — D631 Anemia in chronic kidney disease: Secondary | ICD-10-CM

## 2017-06-14 DIAGNOSIS — M4712 Other spondylosis with myelopathy, cervical region: Secondary | ICD-10-CM

## 2017-06-14 DIAGNOSIS — N189 Chronic kidney disease, unspecified: Secondary | ICD-10-CM

## 2017-06-14 DIAGNOSIS — Z299 Encounter for prophylactic measures, unspecified: Secondary | ICD-10-CM

## 2017-06-14 DIAGNOSIS — Z5112 Encounter for antineoplastic immunotherapy: Secondary | ICD-10-CM | POA: Diagnosis not present

## 2017-06-14 DIAGNOSIS — M2550 Pain in unspecified joint: Secondary | ICD-10-CM

## 2017-06-14 DIAGNOSIS — E349 Endocrine disorder, unspecified: Secondary | ICD-10-CM

## 2017-06-14 LAB — CBC WITH DIFFERENTIAL (CANCER CENTER ONLY)
Basophils Absolute: 0 10*3/uL (ref 0.0–0.1)
Basophils Relative: 1 %
EOS ABS: 0.3 10*3/uL (ref 0.0–0.5)
EOS PCT: 7 %
HCT: 37.1 % — ABNORMAL LOW (ref 38.7–49.9)
HEMOGLOBIN: 12.2 g/dL — AB (ref 13.0–17.1)
LYMPHS ABS: 0.8 10*3/uL — AB (ref 0.9–3.3)
Lymphocytes Relative: 18 %
MCH: 25.8 pg — AB (ref 28.0–33.4)
MCHC: 32.9 g/dL (ref 32.0–35.9)
MCV: 78.6 fL — AB (ref 82.0–98.0)
MONOS PCT: 15 %
Monocytes Absolute: 0.6 10*3/uL (ref 0.1–0.9)
NEUTROS PCT: 59 %
Neutro Abs: 2.6 10*3/uL (ref 1.5–6.5)
Platelet Count: 169 10*3/uL (ref 145–400)
RBC: 4.72 MIL/uL (ref 4.20–5.70)
RDW: 18 % — ABNORMAL HIGH (ref 11.1–15.7)
WBC Count: 4.3 10*3/uL (ref 4.0–10.0)

## 2017-06-14 LAB — CMP (CANCER CENTER ONLY)
ALBUMIN: 3.5 g/dL (ref 3.5–5.0)
ALT: 29 U/L (ref 10–47)
AST: 25 U/L (ref 11–38)
Alkaline Phosphatase: 76 U/L (ref 26–84)
Anion gap: 9 (ref 5–15)
BUN: 24 mg/dL — AB (ref 7–22)
CHLORIDE: 109 mmol/L — AB (ref 98–108)
CO2: 26 mmol/L (ref 18–33)
CREATININE: 1.8 mg/dL — AB (ref 0.60–1.20)
Calcium: 9.5 mg/dL (ref 8.0–10.3)
Glucose, Bld: 71 mg/dL — ABNORMAL LOW (ref 73–118)
Potassium: 4.2 mmol/L (ref 3.3–4.7)
SODIUM: 144 mmol/L (ref 128–145)
Total Bilirubin: 0.6 mg/dL (ref 0.2–1.6)
Total Protein: 6.8 g/dL (ref 6.4–8.1)

## 2017-06-14 MED ORDER — SODIUM CHLORIDE 0.9 % IV SOLN
Freq: Once | INTRAVENOUS | Status: AC
  Administered 2017-06-14: 09:00:00 via INTRAVENOUS

## 2017-06-14 MED ORDER — DEXTROSE 5 % IV SOLN
29.0000 mg/m2 | Freq: Once | INTRAVENOUS | Status: AC
  Administered 2017-06-14: 60 mg via INTRAVENOUS
  Filled 2017-06-14: qty 30

## 2017-06-14 MED ORDER — TESTOSTERONE CYPIONATE 200 MG/ML IM SOLN
INTRAMUSCULAR | Status: AC
  Filled 2017-06-14: qty 2

## 2017-06-14 MED ORDER — PALONOSETRON HCL INJECTION 0.25 MG/5ML
INTRAVENOUS | Status: AC
  Filled 2017-06-14: qty 5

## 2017-06-14 MED ORDER — HYDROMORPHONE HCL 4 MG/ML IJ SOLN
2.0000 mg | Freq: Once | INTRAMUSCULAR | Status: AC
  Administered 2017-06-14: 2 mg via INTRAVENOUS

## 2017-06-14 MED ORDER — SODIUM CHLORIDE 0.9% FLUSH
10.0000 mL | INTRAVENOUS | Status: DC | PRN
  Administered 2017-06-14: 10 mL
  Filled 2017-06-14: qty 10

## 2017-06-14 MED ORDER — PALONOSETRON HCL INJECTION 0.25 MG/5ML
0.2500 mg | Freq: Once | INTRAVENOUS | Status: AC
  Administered 2017-06-14: 0.25 mg via INTRAVENOUS

## 2017-06-14 MED ORDER — HYDROMORPHONE HCL 1 MG/ML IJ SOLN
INTRAMUSCULAR | Status: AC
  Filled 2017-06-14: qty 2

## 2017-06-14 MED ORDER — TESTOSTERONE CYPIONATE 200 MG/ML IM SOLN
300.0000 mg | INTRAMUSCULAR | Status: DC
  Administered 2017-06-14: 300 mg via INTRAMUSCULAR

## 2017-06-14 MED ORDER — DEXAMETHASONE SODIUM PHOSPHATE 10 MG/ML IJ SOLN
10.0000 mg | Freq: Once | INTRAMUSCULAR | Status: AC
  Administered 2017-06-14: 10 mg via INTRAVENOUS

## 2017-06-14 MED ORDER — SODIUM CHLORIDE 0.9 % IV SOLN
Freq: Once | INTRAVENOUS | Status: AC
  Administered 2017-06-14: 10:00:00 via INTRAVENOUS

## 2017-06-14 MED ORDER — SODIUM CHLORIDE 0.9 % IV SOLN
300.0000 mg/m2 | Freq: Once | INTRAVENOUS | Status: AC
  Administered 2017-06-14: 620 mg via INTRAVENOUS
  Filled 2017-06-14: qty 31

## 2017-06-14 MED ORDER — DEXAMETHASONE SODIUM PHOSPHATE 10 MG/ML IJ SOLN
INTRAMUSCULAR | Status: AC
  Filled 2017-06-14: qty 1

## 2017-06-14 MED ORDER — HEPARIN SOD (PORK) LOCK FLUSH 100 UNIT/ML IV SOLN
500.0000 [IU] | Freq: Once | INTRAVENOUS | Status: AC | PRN
  Administered 2017-06-14: 500 [IU]
  Filled 2017-06-14: qty 5

## 2017-06-14 NOTE — Addendum Note (Signed)
Addended by: Smiley Houseman F on: 06/14/2017 11:29 AM   Modules accepted: Orders

## 2017-06-14 NOTE — Progress Notes (Signed)
Ok to treat with creatinine levels today per MD [name]

## 2017-06-14 NOTE — Patient Instructions (Signed)
Implanted Port Insertion, Care After °This sheet gives you information about how to care for yourself after your procedure. Your health care provider may also give you more specific instructions. If you have problems or questions, contact your health care provider. °What can I expect after the procedure? °After your procedure, it is common to have: °· Discomfort at the port insertion site. °· Bruising on the skin over the port. This should improve over 3-4 days. ° °Follow these instructions at home: °Port care °· After your port is placed, you will get a manufacturer's information card. The card has information about your port. Keep this card with you at all times. °· Take care of the port as told by your health care provider. Ask your health care provider if you or a family member can get training for taking care of the port at home. A home health care nurse may also take care of the port. °· Make sure to remember what type of port you have. °Incision care °· Follow instructions from your health care provider about how to take care of your port insertion site. Make sure you: °? Wash your hands with soap and water before you change your bandage (dressing). If soap and water are not available, use hand sanitizer. °? Change your dressing as told by your health care provider. °? Leave stitches (sutures), skin glue, or adhesive strips in place. These skin closures may need to stay in place for 2 weeks or longer. If adhesive strip edges start to loosen and curl up, you may trim the loose edges. Do not remove adhesive strips completely unless your health care provider tells you to do that. °· Check your port insertion site every day for signs of infection. Check for: °? More redness, swelling, or pain. °? More fluid or blood. °? Warmth. °? Pus or a bad smell. °General instructions °· Do not take baths, swim, or use a hot tub until your health care provider approves. °· Do not lift anything that is heavier than 10 lb (4.5  kg) for a week, or as told by your health care provider. °· Ask your health care provider when it is okay to: °? Return to work or school. °? Resume usual physical activities or sports. °· Do not drive for 24 hours if you were given a medicine to help you relax (sedative). °· Take over-the-counter and prescription medicines only as told by your health care provider. °· Wear a medical alert bracelet in case of an emergency. This will tell any health care providers that you have a port. °· Keep all follow-up visits as told by your health care provider. This is important. °Contact a health care provider if: °· You cannot flush your port with saline as directed, or you cannot draw blood from the port. °· You have a fever or chills. °· You have more redness, swelling, or pain around your port insertion site. °· You have more fluid or blood coming from your port insertion site. °· Your port insertion site feels warm to the touch. °· You have pus or a bad smell coming from the port insertion site. °Get help right away if: °· You have chest pain or shortness of breath. °· You have bleeding from your port that you cannot control. °Summary °· Take care of the port as told by your health care provider. °· Change your dressing as told by your health care provider. °· Keep all follow-up visits as told by your health care provider. °  This information is not intended to replace advice given to you by your health care provider. Make sure you discuss any questions you have with your health care provider. °Document Released: 11/30/2012 Document Revised: 01/01/2016 Document Reviewed: 01/01/2016 °Elsevier Interactive Patient Education © 2017 [employer] ° °

## 2017-06-14 NOTE — Patient Instructions (Addendum)
[facility] Discharge Instructions for Patients Receiving Chemotherapy  Today you received the following chemotherapy agents Kyprolis, Cytoxan  To help prevent nausea and vomiting after your treatment, we encourage you to take your nausea medication/ NO ZOFRAN FOR 3 DAYS    If you develop nausea and vomiting that is not controlled by your nausea medication, call the clinic.   BELOW ARE SYMPTOMS THAT SHOULD BE REPORTED IMMEDIATELY:  *FEVER GREATER THAN 100.5 F  *CHILLS WITH OR WITHOUT FEVER  NAUSEA AND VOMITING THAT IS NOT CONTROLLED WITH YOUR NAUSEA MEDICATION  *UNUSUAL SHORTNESS OF BREATH  *UNUSUAL BRUISING OR BLEEDING  TENDERNESS IN MOUTH AND THROAT WITH OR WITHOUT PRESENCE OF ULCERS  *URINARY PROBLEMS  *BOWEL PROBLEMS  UNUSUAL RASH Items with * indicate a potential emergency and should be followed up as soon as possible.  Feel free to call the clinic should you have any questions or concerns. The clinic phone number is [phone].  Please show the [facility] at check-in to the Emergency Department and triage nurse.

## 2017-06-28 ENCOUNTER — Inpatient Hospital Stay: Payer: Medicare Other | Attending: Hematology & Oncology | Admitting: Hematology & Oncology

## 2017-06-28 ENCOUNTER — Other Ambulatory Visit: Payer: Self-pay

## 2017-06-28 ENCOUNTER — Inpatient Hospital Stay: Payer: Medicare Other

## 2017-06-28 ENCOUNTER — Telehealth: Payer: Self-pay | Admitting: *Deleted

## 2017-06-28 ENCOUNTER — Encounter: Payer: Self-pay | Admitting: Hematology & Oncology

## 2017-06-28 ENCOUNTER — Ambulatory Visit (HOSPITAL_BASED_OUTPATIENT_CLINIC_OR_DEPARTMENT_OTHER)
Admission: RE | Admit: 2017-06-28 | Discharge: 2017-06-28 | Disposition: A | Discharge status: Home / Self Care | Payer: Medicare Other | Source: Ambulatory Visit | Attending: Hematology & Oncology | Admitting: Hematology & Oncology

## 2017-06-28 VITALS — BP 126/64 | Temp 99.0°F | Resp 17

## 2017-06-28 VITALS — BP 140/63 | HR 87 | Temp 100.3°F | Wt 193.5 lb

## 2017-06-28 DIAGNOSIS — Z5111 Encounter for antineoplastic chemotherapy: Secondary | ICD-10-CM | POA: Diagnosis present

## 2017-06-28 DIAGNOSIS — Z5112 Encounter for antineoplastic immunotherapy: Secondary | ICD-10-CM | POA: Diagnosis not present

## 2017-06-28 DIAGNOSIS — Z299 Encounter for prophylactic measures, unspecified: Secondary | ICD-10-CM

## 2017-06-28 DIAGNOSIS — D631 Anemia in chronic kidney disease: Secondary | ICD-10-CM

## 2017-06-28 DIAGNOSIS — C9000 Multiple myeloma not having achieved remission: Secondary | ICD-10-CM

## 2017-06-28 DIAGNOSIS — N189 Chronic kidney disease, unspecified: Secondary | ICD-10-CM

## 2017-06-28 DIAGNOSIS — E349 Endocrine disorder, unspecified: Secondary | ICD-10-CM

## 2017-06-28 DIAGNOSIS — D500 Iron deficiency anemia secondary to blood loss (chronic): Secondary | ICD-10-CM

## 2017-06-28 DIAGNOSIS — M1990 Unspecified osteoarthritis, unspecified site: Secondary | ICD-10-CM | POA: Insufficient documentation

## 2017-06-28 DIAGNOSIS — M2550 Pain in unspecified joint: Secondary | ICD-10-CM

## 2017-06-28 DIAGNOSIS — D509 Iron deficiency anemia, unspecified: Secondary | ICD-10-CM | POA: Diagnosis not present

## 2017-06-28 DIAGNOSIS — M4712 Other spondylosis with myelopathy, cervical region: Secondary | ICD-10-CM

## 2017-06-28 LAB — CMP (CANCER CENTER ONLY)
ALBUMIN: 3.5 g/dL (ref 3.5–5.0)
ALT: 23 U/L (ref 10–47)
AST: 23 U/L (ref 11–38)
Alkaline Phosphatase: 65 U/L (ref 26–84)
Anion gap: 5 (ref 5–15)
BILIRUBIN TOTAL: 0.6 mg/dL (ref 0.2–1.6)
BUN: 22 mg/dL (ref 7–22)
CHLORIDE: 107 mmol/L (ref 98–108)
CO2: 27 mmol/L (ref 18–33)
Calcium: 8.8 mg/dL (ref 8.0–10.3)
Creatinine: 2.2 mg/dL — ABNORMAL HIGH (ref 0.60–1.20)
Glucose, Bld: 143 mg/dL — ABNORMAL HIGH (ref 73–118)
POTASSIUM: 4 mmol/L (ref 3.3–4.7)
Sodium: 139 mmol/L (ref 128–145)
Total Protein: 6.7 g/dL (ref 6.4–8.1)

## 2017-06-28 LAB — CBC WITH DIFFERENTIAL (CANCER CENTER ONLY)
BASOS PCT: 0 %
Basophils Absolute: 0.1 10*3/uL (ref 0.0–0.1)
Eosinophils Absolute: 0.1 10*3/uL (ref 0.0–0.5)
Eosinophils Relative: 1 %
HEMATOCRIT: 33.5 % — AB (ref 38.7–49.9)
Hemoglobin: 11.1 g/dL — ABNORMAL LOW (ref 13.0–17.1)
Lymphocytes Relative: 4 %
Lymphs Abs: 0.6 10*3/uL — ABNORMAL LOW (ref 0.9–3.3)
MCH: 25.9 pg — ABNORMAL LOW (ref 28.0–33.4)
MCHC: 33.1 g/dL (ref 32.0–35.9)
MCV: 78.1 fL — AB (ref 82.0–98.0)
MONO ABS: 1 10*3/uL — AB (ref 0.1–0.9)
MONOS PCT: 8 %
NEUTROS ABS: 11.2 10*3/uL — AB (ref 1.5–6.5)
Neutrophils Relative %: 87 %
Platelet Count: 161 10*3/uL (ref 145–400)
RBC: 4.29 MIL/uL (ref 4.20–5.70)
RDW: 17.9 % — AB (ref 11.1–15.7)
WBC Count: 12.9 10*3/uL — ABNORMAL HIGH (ref 4.0–10.0)

## 2017-06-28 LAB — IRON AND TIBC
IRON: 22 ug/dL — AB (ref 42–163)
SATURATION RATIOS: 11 % — AB (ref 42–163)
TIBC: 193 ug/dL — AB (ref 202–409)
UIBC: 171 ug/dL

## 2017-06-28 LAB — FERRITIN: Ferritin: 1844 ng/mL — ABNORMAL HIGH (ref 22–316)

## 2017-06-28 MED ORDER — HEPARIN SOD (PORK) LOCK FLUSH 100 UNIT/ML IV SOLN
500.0000 [IU] | Freq: Once | INTRAVENOUS | Status: DC | PRN
  Filled 2017-06-28: qty 5

## 2017-06-28 MED ORDER — HYDROMORPHONE HCL 1 MG/ML IJ SOLN
INTRAMUSCULAR | Status: AC
  Filled 2017-06-28: qty 2

## 2017-06-28 MED ORDER — HYDROMORPHONE HCL 4 MG/ML IJ SOLN
2.0000 mg | Freq: Once | INTRAMUSCULAR | Status: AC
  Administered 2017-06-28: 2 mg via INTRAVENOUS

## 2017-06-28 MED ORDER — SODIUM CHLORIDE 0.9 % IV SOLN
Freq: Once | INTRAVENOUS | Status: AC
Start: 2017-06-28 — End: 2017-06-28
  Administered 2017-06-28: 09:00:00 via INTRAVENOUS

## 2017-06-28 MED ORDER — SODIUM CHLORIDE 0.9% FLUSH
10.0000 mL | INTRAVENOUS | Status: DC | PRN
  Administered 2017-06-28: 10 mL
  Filled 2017-06-28: qty 10

## 2017-06-28 MED ORDER — CEFDINIR 300 MG PO CAPS: 300.0000 mg | ORAL_CAPSULE | Freq: Two times a day (BID) | ORAL | 0 refills | Status: DC

## 2017-06-28 MED ORDER — CEFTRIAXONE SODIUM 2 G IJ SOLR
2.0000 g | Freq: Once | INTRAMUSCULAR | Status: AC
  Administered 2017-06-28: 2 g via INTRAVENOUS
  Filled 2017-06-28: qty 20

## 2017-06-28 NOTE — Telephone Encounter (Signed)
Patient notified per order of Dr. [name] that he needs to come in for IV iron this or next week.  Pt transferred to scheduling to set up appt.  Patient has no questions at this time.

## 2017-06-28 NOTE — Progress Notes (Signed)
Hematology and Oncology Follow Up Visit  [name] [id number] 04-Oct-1950 67 y.o. 06/28/2017   Principle Diagnosis:  IgG kappa myeloma Anemia secondary to renal insufficiency Intermittent iron - deficiency anemia Hypotestosteronemia  Current Therapy:   Aredia 60 mg IV q 2 months  Aranesp 300 mcg subcu as needed for hemoglobin less than 11 Depo-Testosterone 300 mg q 4 weeks IV iron as indicated Kyprolis/Cytoxan - s/p cycle #6   Interim History:  [name] is here today for follow-up and treatment.  He is not feeling all that well.  He has had a fever since Friday.  He is had a little bit of a cough that is not productive.  We did go ahead and get a chest x-ray on him today.  It is still pending.  His temperatures been up to 100.3.  He is not neutropenic.  His last myeloma studies back in early April showed an M spike of 0.6 g/dL.  This is up a little bit.  His IgG level was 1245 mg/dL.  His rheumatoid arthritis is not bothering him all that much.  He is had no diarrhea.  He has had no rashes.  He has had no mouth sores.   Overall, his performance status is ECOG 1.    Medications:  Allergies as of 06/28/2017      Reactions   Contrast Media [iodinated Diagnostic Agents]    Patient states he was instructed not to take IV contrast.  In 2008 he had an unknown reaction, and was told not to take it again.  He was also told not to take it due to his kidneys.   Iodine Anxiety, Rash, Other (See Comments)   Didn't feel right "instructed not to take per MD--something with his port" Didn't feel right      Medication List        Accurate as of 06/28/17  8:38 AM. Always use your most recent med list.          amLODipine 10 MG tablet Commonly known as:  NORVASC Take 10 mg by mouth daily.   CARAFATE 1 GM/10ML suspension Generic drug:  sucralfate Take 1 g by mouth 4 (four) times daily.   CLEAR EYES ALL SEASONS 5-6 MG/ML Soln Generic drug:  Polyvinyl  Alcohol-Povidone Place 2 drops into both eyes 3 (three) times daily as needed (drye eyes.).   dicyclomine 10 MG capsule Commonly known as:  BENTYL Take 1 capsule (10 mg total) by mouth 3 (three) times daily.   EMBEDA 20-0.8 MG Cpcr Generic drug:  Morphine-Naltrexone Take 1 capsule by mouth 3 (three) times daily.   EMBEDA 30-1.2 MG Cpcr Generic drug:  Morphine-Naltrexone Take 3 (three) times daily by mouth.   furosemide 40 MG tablet Commonly known as:  LASIX Take 40 mg by mouth.   glipiZIDE 5 MG tablet Commonly known as:  GLUCOTROL Take 5 mg by mouth 2 (two) times daily before a meal.   isosorbide mononitrate 30 MG 24 hr tablet Commonly known as:  IMDUR Take 30 mg by mouth daily.   latanoprost 0.005 % ophthalmic solution Commonly known as:  XALATAN Place 1 drop into the right eye every evening.   lidocaine-prilocaine cream Commonly known as:  EMLA Apply 1 application topically as needed.   linaclotide 145 MCG Caps capsule Commonly known as:  LINZESS Take 1 capsule (145 mcg total) by mouth daily before breakfast.   LORazepam 0.5 MG tablet Commonly known as:  ATIVAN Take 1 tablet (0.5 mg total) by mouth  every 6 (six) hours as needed (Nausea or vomiting).   losartan 25 MG tablet Commonly known as:  COZAAR Take 25 mg by mouth.   meclizine 50 MG tablet Commonly known as:  ANTIVERT Take 0.5 tablets (25 mg total) by mouth 4 (four) times daily as needed for dizziness.   methocarbamol 500 MG tablet Commonly known as:  ROBAXIN Take 500 mg by mouth 4 (four) times daily.   mirabegron ER 50 MG Tb24 tablet Commonly known as:  MYRBETRIQ Take 50 mg by mouth daily.   omeprazole 40 MG capsule Commonly known as:  PRILOSEC take 1 capsule by mouth twice a day   ondansetron 4 MG disintegrating tablet Commonly known as:  ZOFRAN ODT Take 1 tablet (4 mg total) by mouth every 8 (eight) hours as needed for nausea or vomiting.   ondansetron 8 MG tablet Commonly known as:   ZOFRAN Take 1 tablet (8 mg total) by mouth 2 (two) times daily as needed (Nausea or vomiting).   orphenadrine 100 MG tablet Commonly known as:  NORFLEX Take 1 tablet (100 mg total) by mouth 2 (two) times daily.   oxyCODONE 20 mg 12 hr tablet Commonly known as:  OXYCONTIN Take 1 tablet (20 mg total) by mouth every 12 (twelve) hours.   oxyCODONE-acetaminophen 10-325 MG tablet Commonly known as:  PERCOCET Take 1 tablet by mouth every 6 (six) hours as needed for pain.   potassium chloride SA 20 MEQ tablet Commonly known as:  K-DUR,KLOR-CON Take 1 tablet (20 mEq total) by mouth 2 (two) times daily.   prochlorperazine 10 MG tablet Commonly known as:  COMPAZINE take 1 tablet by mouth every 6 hours if needed for nausea and vomiting   TOPROL XL 50 MG 24 hr tablet Generic drug:  metoprolol succinate Take 50 mg by mouth daily.   Vitamin D (Ergocalciferol) 50000 units Caps capsule Commonly known as:  DRISDOL Take 50,000 Units by mouth every Saturday.   ZIOPTAN 0.0015 % Soln Generic drug:  Tafluprost Place 1 drop into both eyes daily.       Allergies:  Allergies  Allergen Reactions  . Contrast Media [Iodinated Diagnostic Agents]     Patient states he was instructed not to take IV contrast.  In 2008 he had an unknown reaction, and was told not to take it again.  He was also told not to take it due to his kidneys.  . Iodine Anxiety, Rash and Other (See Comments)    Didn't feel right "instructed not to take per MD--something with his port" Didn't feel right    Past Medical History, Surgical history, Social history, and Family History were reviewed and updated.  Review of Systems: Review of Systems  Constitutional: Positive for fever and malaise/fatigue.  HENT: Negative.   Eyes: Negative.   Respiratory: Positive for cough.   Cardiovascular: Negative.   Gastrointestinal: Negative.   Genitourinary: Negative.   Musculoskeletal: Positive for joint pain and myalgias.  Skin:  Negative.   Neurological: Negative.   Endo/Heme/Allergies: Negative.   Psychiatric/Behavioral: Negative.      Physical Exam:  weight is 193 lb 8 oz (87.8 kg). His oral temperature is 100.3 F (37.9 C). His blood pressure is 140/63 and his pulse is 87. His oxygen saturation is 100%.   Wt Readings from Last 3 Encounters:  06/28/17 193 lb 8 oz (87.8 kg)  06/07/17 193 lb (87.5 kg)  05/31/17 196 lb (88.9 kg)    Physical Exam  Constitutional: He is oriented to person, place,  and time.  HENT:  Head: Normocephalic and atraumatic.  Mouth/Throat: Oropharynx is clear and moist.  Eyes: Pupils are equal, round, and reactive to light. EOM are normal.  Neck: Normal range of motion.  Cardiovascular: Normal rate, regular rhythm and normal heart sounds.  Pulmonary/Chest: Effort normal and breath sounds normal.  Abdominal: Soft. Bowel sounds are normal.  Musculoskeletal: Normal range of motion. He exhibits no edema, tenderness or deformity.  Lymphadenopathy:    He has no cervical adenopathy.  Neurological: He is alert and oriented to person, place, and time.  Skin: Skin is warm and dry. No rash noted. No erythema.  Psychiatric: He has a normal mood and affect. His behavior is normal. Judgment and thought content normal.  Vitals reviewed.    Lab Results  Component Value Date   WBC 12.9 (H) 06/28/2017   HGB 11.1 (L) 06/28/2017   HCT 33.5 (L) 06/28/2017   MCV 78.1 (L) 06/28/2017   PLT 161 06/28/2017   Lab Results  Component Value Date   FERRITIN 738 (H) 05/31/2017   IRON 39 (L) 05/31/2017   TIBC 218 05/31/2017   UIBC 179 05/31/2017   IRONPCTSAT 18 (L) 05/31/2017   Lab Results  Component Value Date   RETICCTPCT 0.6 03/21/2014   RBC 4.29 06/28/2017   RETICCTABS 27.5 03/21/2014   Lab Results  Component Value Date   KPAFRELGTCHN 33.5 (H) 05/31/2017   LAMBDASER 21.4 05/31/2017   KAPLAMBRATIO 1.57 05/31/2017   Lab Results  Component Value Date   IGGSERUM 1,245 05/31/2017   IGA  192 05/31/2017   IGMSERUM 27 05/31/2017   Lab Results  Component Value Date   TOTALPROTELP 7.0 05/31/2017   ALBUMINELP 3.9 05/31/2017   A1GS 0.2 05/31/2017   A2GS 0.9 05/31/2017   BETS 0.9 05/31/2017   BETA2SER 0.5 11/07/2014   GAMS 1.1 05/31/2017   MSPIKE 0.6 (H) 05/31/2017   SPEI Comment 05/31/2017     Chemistry      Component Value Date/Time   NA 139 06/28/2017 0749   NA 143 02/22/2017 0744   K 4.0 06/28/2017 0749   K 4.0 02/22/2017 0744   CL 107 06/28/2017 0749   CL 106 02/22/2017 0744   CO2 27 06/28/2017 0749   CO2 27 02/22/2017 0744   BUN 22 06/28/2017 0749   BUN 26 (H) 02/22/2017 0744   CREATININE 2.20 (H) 06/28/2017 0749   CREATININE 2.3 (H) 02/22/2017 0744      Component Value Date/Time   CALCIUM 8.8 06/28/2017 0749   CALCIUM 9.3 02/22/2017 0744   ALKPHOS 65 06/28/2017 0749   ALKPHOS 80 02/22/2017 0744   AST 23 06/28/2017 0749   ALT 23 06/28/2017 0749   ALT 22 02/22/2017 0744   BILITOT 0.6 06/28/2017 0749      Impression and Plan: Mr. Brink is a very pleasant 67 yo African American gentleman with IgG kappa myeloma.  I will not treat him today.  I worry about him having some type of infection.  I will go ahead and give him a dose of Rocephin.  I will then put him on some IV antibiotics with Omnicef.  I think this would be reasonable.  I will not treat him for 2 weeks.  I think we have the "electric" of holding on his treatments.  We will see him back in 2 weeks.  If he feels better, then we will go ahead with treatment.    I spent about 30 minutes with him today.  Over half the time  was spent face-to-face with him.  Volanda Napoleon, MD 5/6/20198:38 AM\

## 2017-06-28 NOTE — Patient Instructions (Signed)
Ceftriaxone injection What is this medicine? CEFTRIAXONE (sef try AX one) is a cephalosporin antibiotic. It is used to treat certain kinds of bacterial infections. It will not work for colds, flu, or other viral infections. This medicine may be used for other purposes; ask your health care provider or pharmacist if you have questions. COMMON BRAND NAME(S): Rocephin What should I tell my health care provider before I take this medicine? They need to know if you have any of these conditions: -any chronic illness -bowel disease, like colitis -both kidney and liver disease -high bilirubin level in newborn patients -an unusual or allergic reaction to ceftriaxone, other cephalosporin or penicillin antibiotics, foods, dyes, or preservatives -pregnant or trying to get pregnant -breast-feeding How should I use this medicine? This medicine is injected into a muscle or infused it into a vein. It is usually given in a medical office or clinic. If you are to give this medicine you will be taught how to inject it. Follow instructions carefully. Use your doses at regular intervals. Do not take your medicine more often than directed. Do not skip doses or stop your medicine early even if you feel better. Do not stop taking except on your doctor's advice. Talk to your pediatrician regarding the use of this medicine in children. Special care may be needed. Overdosage: If you think you have taken too much of this medicine contact a poison control center or emergency room at once. NOTE: This medicine is only for you. Do not share this medicine with others. What if I miss a dose? If you miss a dose, take it as soon as you can. If it is almost time for your next dose, take only that dose. Do not take double or extra doses. What may interact with this medicine? Do not take this medicine with any of the following medications: -intravenous calcium This medicine may also interact with the following medications: -birth  control pills This list may not describe all possible interactions. Give your health care provider a list of all the medicines, herbs, non-prescription drugs, or dietary supplements you use. Also tell them if you smoke, drink alcohol, or use illegal drugs. Some items may interact with your medicine. What should I watch for while using this medicine? Tell your doctor or health care professional if your symptoms do not improve or if they get worse. Do not treat diarrhea with over the counter products. Contact your doctor if you have diarrhea that lasts more than 2 days or if it is severe and watery. If you are being treated for a sexually transmitted disease, avoid sexual contact until you have finished your treatment. Having sex can infect your sexual partner. Calcium may bind to this medicine and cause lung or kidney problems. Avoid calcium products while taking this medicine and for 48 hours after taking the last dose of this medicine. What side effects may I notice from receiving this medicine? Side effects that you should report to your doctor or health care professional as soon as possible: -allergic reactions like skin rash, itching or hives, swelling of the face, lips, or tongue -breathing problems -fever, chills -irregular heartbeat -pain when passing urine -seizures -stomach pain, cramps -unusual bleeding, bruising -unusually weak or tired Side effects that usually do not require medical attention (report to your doctor or health care professional if they continue or are bothersome): -diarrhea -dizzy, drowsy -headache -nausea, vomiting -pain, swelling, irritation where injected -stomach upset -sweating This list may not describe all possible side effects.   Call your doctor for medical advice about side effects. You may report side effects to FDA at 1-800-FDA-1088. Where should I keep my medicine? Keep out of the reach of children. Store at room temperature below 25 degrees C (77  degrees F). Protect from light. Throw away any unused vials after the expiration date. NOTE: This sheet is a summary. It may not cover all possible information. If you have questions about this medicine, talk to your doctor, pharmacist, or health care provider.  2018 Elsevier/Gold Standard (2013-08-28 09:14:54)  

## 2017-06-28 NOTE — Patient Instructions (Signed)
Implanted Port Home Guide An implanted port is a type of central line that is placed under the skin. Central lines are used to provide IV access when treatment or nutrition needs to be given through a person's veins. Implanted ports are used for long-term IV access. An implanted port may be placed because:  You need IV medicine that would be irritating to the small veins in your hands or arms.  You need long-term IV medicines, such as antibiotics.  You need IV nutrition for a long period.  You need frequent blood draws for lab tests.  You need dialysis.  Implanted ports are usually placed in the chest area, but they can also be placed in the upper arm, the abdomen, or the leg. An implanted port has two main parts:  Reservoir. The reservoir is round and will appear as a small, raised area under your skin. The reservoir is the part where a needle is inserted to give medicines or draw blood.  Catheter. The catheter is a thin, flexible tube that extends from the reservoir. The catheter is placed into a large vein. Medicine that is inserted into the reservoir goes into the catheter and then into the vein.  How will I care for my incision site? Do not get the incision site wet. Bathe or shower as directed by your health care provider. How is my port accessed? Special steps must be taken to access the port:  Before the port is accessed, a numbing cream can be placed on the skin. This helps numb the skin over the port site.  Your health care provider uses a sterile technique to access the port. ? Your health care provider must put on a mask and sterile gloves. ? The skin over your port is cleaned carefully with an antiseptic and allowed to dry. ? The port is gently pinched between sterile gloves, and a needle is inserted into the port.  Only "non-coring" port needles should be used to access the port. Once the port is accessed, a blood return should be checked. This helps ensure that the port  is in the vein and is not clogged.  If your port needs to remain accessed for a constant infusion, a clear (transparent) bandage will be placed over the needle site. The bandage and needle will need to be changed every week, or as directed by your health care provider.  Keep the bandage covering the needle clean and dry. Do not get it wet. Follow your health care provider's instructions on how to take a shower or bath while the port is accessed.  If your port does not need to stay accessed, no bandage is needed over the port.  What is flushing? Flushing helps keep the port from getting clogged. Follow your health care provider's instructions on how and when to flush the port. Ports are usually flushed with saline solution or a medicine called heparin. The need for flushing will depend on how the port is used.  If the port is used for intermittent medicines or blood draws, the port will need to be flushed: ? After medicines have been given. ? After blood has been drawn. ? As part of routine maintenance.  If a constant infusion is running, the port may not need to be flushed.  How long will my port stay implanted? The port can stay in for as long as your health care provider thinks it is needed. When it is time for the port to come out, surgery will be   done to remove it. The procedure is similar to the one performed when the port was put in. When should I seek immediate medical care? When you have an implanted port, you should seek immediate medical care if:  You notice a bad smell coming from the incision site.  You have swelling, redness, or drainage at the incision site.  You have more swelling or pain at the port site or the surrounding area.  You have a fever that is not controlled with medicine.  This information is not intended to replace advice given to you by your health care provider. Make sure you discuss any questions you have with your health care provider. Document  Released: 02/09/2005 Document Revised: 07/18/2015 Document Reviewed: 10/17/2012 Elsevier Interactive Patient Education  2017 [employer]  

## 2017-06-28 NOTE — Telephone Encounter (Signed)
-----   Message from Volanda Napoleon, MD sent at 06/28/2017  2:24 PM EDT ----- Call him -- he needs to come in for IV iron this or next week!!  Laurey Arrow

## 2017-06-29 LAB — KAPPA/LAMBDA LIGHT CHAINS
KAPPA FREE LGHT CHN: 27.5 mg/L — AB (ref 3.3–19.4)
KAPPA, LAMDA LIGHT CHAIN RATIO: 1.48 (ref 0.26–1.65)
LAMDA FREE LIGHT CHAINS: 18.6 mg/L (ref 5.7–26.3)

## 2017-06-29 LAB — IGG, IGA, IGM
IgA: 155 mg/dL (ref 61–437)
IgG (Immunoglobin G), Serum: 994 mg/dL (ref 700–1600)
IgM (Immunoglobulin M), Srm: 24 mg/dL (ref 20–172)

## 2017-07-01 LAB — IMMUNOFIXATION REFLEX, SERUM
IgA: 157 mg/dL (ref 61–437)
IgG (Immunoglobin G), Serum: 1026 mg/dL (ref 700–1600)
IgM (Immunoglobulin M), Srm: 24 mg/dL (ref 20–172)

## 2017-07-01 LAB — PROTEIN ELECTROPHORESIS, SERUM, WITH REFLEX
A/G Ratio: 1.2 (ref 0.7–1.7)
ALBUMIN ELP: 3.5 g/dL (ref 2.9–4.4)
ALPHA-2-GLOBULIN: 1 g/dL (ref 0.4–1.0)
Alpha-1-Globulin: 0.2 g/dL (ref 0.0–0.4)
BETA GLOBULIN: 0.7 g/dL (ref 0.7–1.3)
GAMMA GLOBULIN: 1 g/dL (ref 0.4–1.8)
Globulin, Total: 2.9 g/dL (ref 2.2–3.9)
M-Spike, %: 0.4 g/dL — ABNORMAL HIGH
SPEP INTERP: 0
Total Protein ELP: 6.4 g/dL (ref 6.0–8.5)

## 2017-07-05 ENCOUNTER — Inpatient Hospital Stay: Payer: Medicare Other

## 2017-07-05 ENCOUNTER — Ambulatory Visit: Payer: Medicare Other

## 2017-07-05 ENCOUNTER — Other Ambulatory Visit: Payer: Medicare Other

## 2017-07-05 ENCOUNTER — Other Ambulatory Visit: Payer: Self-pay

## 2017-07-05 VITALS — BP 134/70 | HR 82 | Temp 98.5°F | Resp 16

## 2017-07-05 DIAGNOSIS — C9000 Multiple myeloma not having achieved remission: Secondary | ICD-10-CM

## 2017-07-05 DIAGNOSIS — Z5112 Encounter for antineoplastic immunotherapy: Secondary | ICD-10-CM | POA: Diagnosis not present

## 2017-07-05 DIAGNOSIS — D500 Iron deficiency anemia secondary to blood loss (chronic): Secondary | ICD-10-CM

## 2017-07-05 MED ORDER — HYDROMORPHONE HCL 2 MG/ML IJ SOLN
INTRAMUSCULAR | Status: AC
  Filled 2017-07-05: qty 1

## 2017-07-05 MED ORDER — ONDANSETRON HCL 8 MG PO TABS: 8.0000 mg | ORAL_TABLET | Freq: Two times a day (BID) | ORAL | 1 refills | Status: DC | PRN

## 2017-07-05 MED ORDER — SODIUM CHLORIDE 0.9 % IV SOLN
60.0000 mg | Freq: Once | INTRAVENOUS | Status: AC
  Administered 2017-07-05: 60 mg via INTRAVENOUS
  Filled 2017-07-05: qty 10

## 2017-07-05 MED ORDER — SODIUM CHLORIDE 0.9 % IV SOLN
510.0000 mg | Freq: Once | INTRAVENOUS | Status: AC
  Administered 2017-07-05: 510 mg via INTRAVENOUS
  Filled 2017-07-05: qty 17

## 2017-07-05 MED ORDER — LORAZEPAM 0.5 MG PO TABS: 0.5000 mg | ORAL_TABLET | Freq: Four times a day (QID) | ORAL | 2 refills | Status: DC | PRN

## 2017-07-05 MED ORDER — HYDROMORPHONE HCL 4 MG/ML IJ SOLN
2.0000 mg | Freq: Once | INTRAMUSCULAR | Status: AC
  Administered 2017-07-05: 2 mg via INTRAVENOUS

## 2017-07-05 NOTE — Progress Notes (Signed)
Per Dr [name], ok to treat with Aredia today. dph

## 2017-07-05 NOTE — Patient Instructions (Signed)
Ferumoxytol injection What is this medicine? FERUMOXYTOL is an iron complex. Iron is used to make healthy red blood cells, which carry oxygen and nutrients throughout the body. This medicine is used to treat iron deficiency anemia in people with chronic kidney disease. This medicine may be used for other purposes; ask your health care provider or pharmacist if you have questions. COMMON BRAND NAME(S): Feraheme What should I tell my health care provider before I take this medicine? They need to know if you have any of these conditions: -anemia not caused by low iron levels -high levels of iron in the blood -magnetic resonance imaging (MRI) test scheduled -an unusual or allergic reaction to iron, other medicines, foods, dyes, or preservatives -pregnant or trying to get pregnant -breast-feeding How should I use this medicine? This medicine is for injection into a vein. It is given by a health care professional in a hospital or clinic setting. Talk to your pediatrician regarding the use of this medicine in children. Special care may be needed. Overdosage: If you think you have taken too much of this medicine contact a poison control center or emergency room at once. NOTE: This medicine is only for you. Do not share this medicine with others. What if I miss a dose? It is important not to miss your dose. Call your doctor or health care professional if you are unable to keep an appointment. What may interact with this medicine? This medicine may interact with the following medications: -other iron products This list may not describe all possible interactions. Give your health care provider a list of all the medicines, herbs, non-prescription drugs, or dietary supplements you use. Also tell them if you smoke, drink alcohol, or use illegal drugs. Some items may interact with your medicine. What should I watch for while using this medicine? Visit your doctor or healthcare professional regularly. Tell  your doctor or healthcare professional if your symptoms do not start to get better or if they get worse. You may need blood work done while you are taking this medicine. You may need to follow a special diet. Talk to your doctor. Foods that contain iron include: whole grains/cereals, dried fruits, beans, or peas, leafy green vegetables, and organ meats (liver, kidney). What side effects may I notice from receiving this medicine? Side effects that you should report to your doctor or health care professional as soon as possible: -allergic reactions like skin rash, itching or hives, swelling of the face, lips, or tongue -breathing problems -changes in blood pressure -feeling faint or lightheaded, falls -fever or chills -flushing, sweating, or hot feelings -swelling of the ankles or feet Side effects that usually do not require medical attention (report to your doctor or health care professional if they continue or are bothersome): -diarrhea -headache -nausea, vomiting -stomach pain This list may not describe all possible side effects. Call your doctor for medical advice about side effects. You may report side effects to FDA at 1-800-FDA-1088. Where should I keep my medicine? This drug is given in a hospital or clinic and will not be stored at home. NOTE: This sheet is a summary. It may not cover all possible information. If you have questions about this medicine, talk to your doctor, pharmacist, or health care provider.  2018 Elsevier/Gold Standard (2015-03-14 12:41:49) Pamidronate injection What is this medicine? PAMIDRONATE (pa mi DROE nate) slows calcium loss from bones. It is used to treat high calcium blood levels from cancer or Paget's disease. It is also used to treat  bone pain and prevent fractures from certain cancers that have spread to the bone. This medicine may be used for other purposes; ask your health care provider or pharmacist if you have questions. COMMON BRAND NAME(S):  Aredia What should I tell my health care provider before I take this medicine? They need to know if you have any of these conditions: -aspirin-sensitive asthma -dental disease -kidney disease -an unusual or allergic reaction to pamidronate, other medicines, foods, dyes, or preservatives -pregnant or trying to get pregnant -breast-feeding How should I use this medicine? This medicine is for infusion into a vein. It is given by a health care professional in a hospital or clinic setting. Talk to your pediatrician regarding the use of this medicine in children. This medicine is not approved for use in children. Overdosage: If you think you have taken too much of this medicine contact a poison control center or emergency room at once. NOTE: This medicine is only for you. Do not share this medicine with others. What if I miss a dose? This does not apply. What may interact with this medicine? -certain antibiotics given by injection -medicines for inflammation or pain like ibuprofen, naproxen -some diuretics like bumetanide, furosemide -cyclosporine -parathyroid hormone -tacrolimus -teriparatide -thalidomide This list may not describe all possible interactions. Give your health care provider a list of all the medicines, herbs, non-prescription drugs, or dietary supplements you use. Also tell them if you smoke, drink alcohol, or use illegal drugs. Some items may interact with your medicine. What should I watch for while using this medicine? Visit your doctor or health care professional for regular checkups. It may be some time before you see the benefit from this medicine. Do not stop taking your medicine unless your doctor tells you to. Your doctor may order blood tests or other tests to see how you are doing. Women should inform their doctor if they wish to become pregnant or think they might be pregnant. There is a potential for serious side effects to an unborn child. Talk to your health care  professional or pharmacist for more information. You should make sure that you get enough calcium and vitamin D while you are taking this medicine. Discuss the foods you eat and the vitamins you take with your health care professional. Some people who take this medicine have severe bone, joint, and/or muscle pain. This medicine may also increase your risk for a broken thigh bone. Tell your doctor right away if you have pain in your upper leg or groin. Tell your doctor if you have any pain that does not go away or that gets worse. What side effects may I notice from receiving this medicine? Side effects that you should report to your doctor or health care professional as soon as possible: -allergic reactions like skin rash, itching or hives, swelling of the face, lips, or tongue -black or tarry stools -changes in vision -eye inflammation, pain -high blood pressure -jaw pain, especially burning or cramping -muscle weakness -numb, tingling pain -swelling of feet or hands -trouble passing urine or change in the amount of urine -unable to move easily Side effects that usually do not require medical attention (report to your doctor or health care professional if they continue or are bothersome): -bone, joint, or muscle pain -constipation -dizzy, drowsy -fever -headache -loss of appetite -nausea, vomiting -pain at site where injected This list may not describe all possible side effects. Call your doctor for medical advice about side effects. You may report side effects  to FDA at 1-800-FDA-1088. Where should I keep my medicine? This drug is given in a hospital or clinic and will not be stored at home. NOTE: This sheet is a summary. It may not cover all possible information. If you have questions about this medicine, talk to your doctor, pharmacist, or health care provider.  2018 Elsevier/Gold Standard (2010-08-08 08:49:49)

## 2017-07-12 ENCOUNTER — Other Ambulatory Visit: Payer: Self-pay

## 2017-07-12 ENCOUNTER — Inpatient Hospital Stay: Payer: Medicare Other

## 2017-07-12 ENCOUNTER — Inpatient Hospital Stay (HOSPITAL_BASED_OUTPATIENT_CLINIC_OR_DEPARTMENT_OTHER): Payer: Medicare Other | Admitting: Family

## 2017-07-12 ENCOUNTER — Encounter: Payer: Self-pay | Admitting: Family

## 2017-07-12 VITALS — BP 159/73 | HR 80 | Temp 98.4°F | Resp 18 | Wt 190.0 lb

## 2017-07-12 DIAGNOSIS — C9000 Multiple myeloma not having achieved remission: Secondary | ICD-10-CM | POA: Diagnosis not present

## 2017-07-12 DIAGNOSIS — R52 Pain, unspecified: Secondary | ICD-10-CM

## 2017-07-12 DIAGNOSIS — D631 Anemia in chronic kidney disease: Secondary | ICD-10-CM | POA: Diagnosis not present

## 2017-07-12 DIAGNOSIS — N189 Chronic kidney disease, unspecified: Secondary | ICD-10-CM | POA: Diagnosis not present

## 2017-07-12 DIAGNOSIS — M1990 Unspecified osteoarthritis, unspecified site: Secondary | ICD-10-CM

## 2017-07-12 DIAGNOSIS — D509 Iron deficiency anemia, unspecified: Secondary | ICD-10-CM | POA: Diagnosis not present

## 2017-07-12 DIAGNOSIS — D500 Iron deficiency anemia secondary to blood loss (chronic): Secondary | ICD-10-CM

## 2017-07-12 DIAGNOSIS — Z5112 Encounter for antineoplastic immunotherapy: Secondary | ICD-10-CM | POA: Diagnosis not present

## 2017-07-12 DIAGNOSIS — M2550 Pain in unspecified joint: Secondary | ICD-10-CM

## 2017-07-12 LAB — CMP (CANCER CENTER ONLY)
ALK PHOS: 70 U/L (ref 26–84)
ALT: 41 U/L (ref 10–47)
ANION GAP: 8 (ref 5–15)
AST: 36 U/L (ref 11–38)
Albumin: 3.6 g/dL (ref 3.5–5.0)
BILIRUBIN TOTAL: 0.5 mg/dL (ref 0.2–1.6)
BUN: 26 mg/dL — ABNORMAL HIGH (ref 7–22)
CALCIUM: 9.2 mg/dL (ref 8.0–10.3)
CO2: 28 mmol/L (ref 18–33)
Chloride: 108 mmol/L (ref 98–108)
Creatinine: 2.3 mg/dL — ABNORMAL HIGH (ref 0.60–1.20)
Glucose, Bld: 179 mg/dL — ABNORMAL HIGH (ref 73–118)
Potassium: 4.1 mmol/L (ref 3.3–4.7)
Sodium: 144 mmol/L (ref 128–145)
TOTAL PROTEIN: 7.7 g/dL (ref 6.4–8.1)

## 2017-07-12 LAB — CBC WITH DIFFERENTIAL (CANCER CENTER ONLY)
Basophils Absolute: 0.1 10*3/uL (ref 0.0–0.1)
Basophils Relative: 1 %
Eosinophils Absolute: 0.3 10*3/uL (ref 0.0–0.5)
Eosinophils Relative: 4 %
HEMATOCRIT: 35.2 % — AB (ref 38.7–49.9)
HEMOGLOBIN: 11.5 g/dL — AB (ref 13.0–17.1)
LYMPHS PCT: 14 %
Lymphs Abs: 1 10*3/uL (ref 0.9–3.3)
MCH: 25.4 pg — ABNORMAL LOW (ref 28.0–33.4)
MCHC: 32.7 g/dL (ref 32.0–35.9)
MCV: 77.7 fL — AB (ref 82.0–98.0)
MONO ABS: 0.7 10*3/uL (ref 0.1–0.9)
Monocytes Relative: 10 %
NEUTROS ABS: 5 10*3/uL (ref 1.5–6.5)
NEUTROS PCT: 71 %
Platelet Count: 220 10*3/uL (ref 145–400)
RBC: 4.53 MIL/uL (ref 4.20–5.70)
RDW: 18.4 % — ABNORMAL HIGH (ref 11.1–15.7)
WBC: 7.1 10*3/uL (ref 4.0–10.0)

## 2017-07-12 LAB — IRON AND TIBC
IRON: 216 ug/dL — AB (ref 42–163)
Saturation Ratios: 114 % (ref 42–163)
TIBC: 190 ug/dL — ABNORMAL LOW (ref 202–409)
UIBC: UNDETERMINED ug/dL

## 2017-07-12 LAB — FERRITIN: FERRITIN: 2383 ng/mL — AB (ref 22–316)

## 2017-07-12 LAB — LACTATE DEHYDROGENASE: LDH: 244 U/L (ref 125–245)

## 2017-07-12 MED ORDER — SODIUM CHLORIDE 0.9 % IV SOLN
Freq: Once | INTRAVENOUS | Status: AC
  Administered 2017-07-12: 09:00:00 via INTRAVENOUS

## 2017-07-12 MED ORDER — SODIUM CHLORIDE 0.9 % IV SOLN: Freq: Once | INTRAVENOUS | Status: AC

## 2017-07-12 MED ORDER — TRAMADOL HCL 50 MG PO TABS: 50.0000 mg | ORAL_TABLET | Freq: Four times a day (QID) | ORAL | 1 refills | Status: DC | PRN

## 2017-07-12 MED ORDER — ALTEPLASE 2 MG IJ SOLR
2.0000 mg | Freq: Once | INTRAMUSCULAR | Status: DC | PRN
Start: 2017-07-12 — End: 2017-07-12
  Filled 2017-07-12: qty 2

## 2017-07-12 MED ORDER — SODIUM CHLORIDE 0.9% FLUSH
10.0000 mL | INTRAVENOUS | Status: DC | PRN
  Administered 2017-07-12: 10 mL
  Filled 2017-07-12: qty 10

## 2017-07-12 MED ORDER — PALONOSETRON HCL INJECTION 0.25 MG/5ML
0.2500 mg | Freq: Once | INTRAVENOUS | Status: AC
  Administered 2017-07-12: 0.25 mg via INTRAVENOUS

## 2017-07-12 MED ORDER — HYDROMORPHONE HCL 4 MG/ML IJ SOLN
2.0000 mg | Freq: Once | INTRAMUSCULAR | Status: AC
  Administered 2017-07-12: 2 mg via INTRAVENOUS

## 2017-07-12 MED ORDER — SODIUM CHLORIDE 0.9 % IV SOLN
300.0000 mg/m2 | Freq: Once | INTRAVENOUS | Status: AC
  Administered 2017-07-12: 620 mg via INTRAVENOUS
  Filled 2017-07-12: qty 31

## 2017-07-12 MED ORDER — DEXTROSE 5 % IV SOLN
29.0000 mg/m2 | Freq: Once | INTRAVENOUS | Status: AC
  Administered 2017-07-12: 60 mg via INTRAVENOUS
  Filled 2017-07-12: qty 30

## 2017-07-12 MED ORDER — DEXAMETHASONE SODIUM PHOSPHATE 10 MG/ML IJ SOLN
INTRAMUSCULAR | Status: AC
  Filled 2017-07-12: qty 1

## 2017-07-12 MED ORDER — HEPARIN SOD (PORK) LOCK FLUSH 100 UNIT/ML IV SOLN
500.0000 [IU] | Freq: Once | INTRAVENOUS | Status: AC | PRN
  Administered 2017-07-12: 500 [IU]
  Filled 2017-07-12: qty 5

## 2017-07-12 MED ORDER — PALONOSETRON HCL INJECTION 0.25 MG/5ML
INTRAVENOUS | Status: AC
  Filled 2017-07-12: qty 5

## 2017-07-12 MED ORDER — DEXAMETHASONE SODIUM PHOSPHATE 10 MG/ML IJ SOLN
10.0000 mg | Freq: Once | INTRAMUSCULAR | Status: AC
  Administered 2017-07-12: 10 mg via INTRAVENOUS

## 2017-07-12 MED ORDER — HYDROMORPHONE HCL 2 MG/ML IJ SOLN
INTRAMUSCULAR | Status: AC
  Filled 2017-07-12: qty 1

## 2017-07-12 NOTE — Progress Notes (Signed)
OK to treat with creatinine of 2.3 per Judson Roch, NP. dph

## 2017-07-12 NOTE — Patient Instructions (Signed)
[facility] Discharge Instructions for Patients Receiving Chemotherapy  Today you received the following chemotherapy agents Cytoxan, Kyprolis  To help prevent nausea and vomiting after your treatment, we encourage you to take your nausea medication    If you develop nausea and vomiting that is not controlled by your nausea medication, call the clinic.   BELOW ARE SYMPTOMS THAT SHOULD BE REPORTED IMMEDIATELY:  *FEVER GREATER THAN 100.5 F  *CHILLS WITH OR WITHOUT FEVER  NAUSEA AND VOMITING THAT IS NOT CONTROLLED WITH YOUR NAUSEA MEDICATION  *UNUSUAL SHORTNESS OF BREATH  *UNUSUAL BRUISING OR BLEEDING  TENDERNESS IN MOUTH AND THROAT WITH OR WITHOUT PRESENCE OF ULCERS  *URINARY PROBLEMS  *BOWEL PROBLEMS  UNUSUAL RASH Items with * indicate a potential emergency and should be followed up as soon as possible.  Feel free to call the clinic should you have any questions or concerns. The clinic phone number is [phone].  Please show the CHEMO ALERT CARD at check-in to the Emergency Department and triage nurse.   

## 2017-07-12 NOTE — Progress Notes (Signed)
Hematology and Oncology Follow Up Visit  [name] [id number] January 06, 1951 67 y.o. 07/12/2017   Principle Diagnosis:  IgG kappa myeloma Anemia secondary to renal insufficiency Intermittent iron - deficiency anemia Hypotestosteronemia  Current Therapy:   Aredia 60 mg IV q 2 months  Aranesp 300 mcg subcu as needed for hemoglobin less than 11 Depo-Testosterone 300 mg q 4 weeks IV iron as indicated Kyprolis/Cytoxan - s/p cycle6   Interim History:  [name] is here today for follow-up and treatment. He is having generalized aches and pain in his joints and lower back due to arthritis. He states that this does not improve with the Oxycodone or percocet.  He is not sleeping well due to this pain.  He completed his Omnicef. No fever, chills, cough, rash, dizziness, SOB, chest pain, palpitations, abdominal pain or changes in bowel or bladder habits. No incontinence of bowel or bladder.  Earlier this month his M-spike was 0.4, IgG level 994 mg/dL and kappa light chains 2.75 mg/dL.   No weakness, swelling, tenderness, numbness or tingling in her extremities. No falls or syncopal episodes.  No episodes of bleeding, no bruising or petechiae. No lymphadenopathy found on exam.  His appetite is down. He is trying to stay well hydrated. His weight is stable.   ECOG Performance Status: 1 - Symptomatic but completely ambulatory  Medications:  Allergies as of 07/12/2017      Reactions   Iodinated Diagnostic Agents Rash   Patient states he was instructed not to take IV contrast.  In 2008 he had an unknown reaction, and was told not to take it again.  He was also told not to take it due to his kidneys.   Iodine Anxiety, Rash, Other (See Comments)   Didn't feel right "instructed not to take per MD--something with his port" Didn't feel right Didn't feel right "instructed not to take per MD--something with his port"      Medication List        Accurate as of 07/12/17  8:30 AM. Always use  your most recent med list.          amLODipine 10 MG tablet Commonly known as:  NORVASC Take 10 mg by mouth daily.   CARAFATE 1 GM/10ML suspension Generic drug:  sucralfate Take 1 g by mouth 4 (four) times daily.   cefdinir 300 MG capsule Commonly known as:  OMNICEF Take 1 capsule (300 mg total) by mouth 2 (two) times daily.   CLEAR EYES ALL SEASONS 5-6 MG/ML Soln Generic drug:  Polyvinyl Alcohol-Povidone Place 2 drops into both eyes 3 (three) times daily as needed (drye eyes.).   dicyclomine 10 MG capsule Commonly known as:  BENTYL Take 1 capsule (10 mg total) by mouth 3 (three) times daily.   EMBEDA 20-0.8 MG Cpcr Generic drug:  Morphine-Naltrexone Take 1 capsule by mouth 3 (three) times daily.   EMBEDA 30-1.2 MG Cpcr Generic drug:  Morphine-Naltrexone Take 3 (three) times daily by mouth.   furosemide 40 MG tablet Commonly known as:  LASIX Take 40 mg by mouth.   glipiZIDE 5 MG tablet Commonly known as:  GLUCOTROL Take 5 mg by mouth 2 (two) times daily before a meal.   isosorbide mononitrate 30 MG 24 hr tablet Commonly known as:  IMDUR Take 30 mg by mouth daily.   latanoprost 0.005 % ophthalmic solution Commonly known as:  XALATAN Place 1 drop into the right eye every evening.   lidocaine-prilocaine cream Commonly known as:  EMLA Apply 1 application  topically as needed.   linaclotide 145 MCG Caps capsule Commonly known as:  LINZESS Take 1 capsule (145 mcg total) by mouth daily before breakfast.   LORazepam 0.5 MG tablet Commonly known as:  ATIVAN Take 1 tablet (0.5 mg total) by mouth every 6 (six) hours as needed (Nausea or vomiting).   losartan 25 MG tablet Commonly known as:  COZAAR Take 25 mg by mouth.   meclizine 50 MG tablet Commonly known as:  ANTIVERT Take 0.5 tablets (25 mg total) by mouth 4 (four) times daily as needed for dizziness.   methocarbamol 500 MG tablet Commonly known as:  ROBAXIN Take 500 mg by mouth 4 (four) times daily.     mirabegron ER 50 MG Tb24 tablet Commonly known as:  MYRBETRIQ Take 50 mg by mouth daily.   omeprazole 40 MG capsule Commonly known as:  PRILOSEC take 1 capsule by mouth twice a day   ondansetron 4 MG disintegrating tablet Commonly known as:  ZOFRAN ODT Take 1 tablet (4 mg total) by mouth every 8 (eight) hours as needed for nausea or vomiting.   ondansetron 8 MG tablet Commonly known as:  ZOFRAN Take 1 tablet (8 mg total) by mouth 2 (two) times daily as needed (Nausea or vomiting).   orphenadrine 100 MG tablet Commonly known as:  NORFLEX Take 1 tablet (100 mg total) by mouth 2 (two) times daily.   oxyCODONE 20 mg 12 hr tablet Commonly known as:  OXYCONTIN Take 1 tablet (20 mg total) by mouth every 12 (twelve) hours.   oxyCODONE-acetaminophen 10-325 MG tablet Commonly known as:  PERCOCET Take 1 tablet by mouth every 6 (six) hours as needed for pain.   potassium chloride SA 20 MEQ tablet Commonly known as:  K-DUR,KLOR-CON Take 1 tablet (20 mEq total) by mouth 2 (two) times daily.   prochlorperazine 10 MG tablet Commonly known as:  COMPAZINE take 1 tablet by mouth every 6 hours if needed for nausea and vomiting   TOPROL XL 50 MG 24 hr tablet Generic drug:  metoprolol succinate Take 50 mg by mouth daily.   Vitamin D (Ergocalciferol) 50000 units Caps capsule Commonly known as:  DRISDOL Take 50,000 Units by mouth every Saturday.   ZIOPTAN 0.0015 % Soln Generic drug:  Tafluprost Place 1 drop into both eyes daily.       Allergies:  Allergies  Allergen Reactions  . Iodinated Diagnostic Agents Rash    Patient states he was instructed not to take IV contrast.  In 2008 he had an unknown reaction, and was told not to take it again.  He was also told not to take it due to his kidneys.  . Iodine Anxiety, Rash and Other (See Comments)    Didn't feel right "instructed not to take per MD--something with his port" Didn't feel right Didn't feel right "instructed not to take  per MD--something with his port"    Past Medical History, Surgical history, Social history, and Family History were reviewed and updated.  Review of Systems: All other 10 point review of systems is negative.   Physical Exam:  weight is 190 lb (86.2 kg). His oral temperature is 98.4 F (36.9 C). His blood pressure is 159/73 (abnormal) and his pulse is 80. His respiration is 18 and oxygen saturation is 98%.   Wt Readings from Last 3 Encounters:  07/12/17 190 lb (86.2 kg)  06/28/17 193 lb 8 oz (87.8 kg)  06/07/17 193 lb (87.5 kg)    Ocular: Sclerae unicteric, pupils  equal, round and reactive to light Ear-nose-throat: Oropharynx clear, dentition fair Lymphatic: No cervical, supraclavicular or axillary adenopathy Lungs no rales or rhonchi, good excursion bilaterally Heart regular rate and rhythm, no murmur appreciated Abd soft, nontender, positive bowel sounds, no liver or spleen tip palpated on exam, no fluid wave  MSK no focal spinal tenderness, no joint edema Neuro: non-focal, well-oriented, appropriate affect Breasts: Deferred   Lab Results  Component Value Date   WBC 7.1 07/12/2017   HGB 11.5 (L) 07/12/2017   HCT 35.2 (L) 07/12/2017   MCV 77.7 (L) 07/12/2017   PLT 220 07/12/2017   Lab Results  Component Value Date   FERRITIN 1,844 (H) 06/28/2017   IRON 22 (L) 06/28/2017   TIBC 193 (L) 06/28/2017   UIBC 171 06/28/2017   IRONPCTSAT 11 (L) 06/28/2017   Lab Results  Component Value Date   RETICCTPCT 0.6 03/21/2014   RBC 4.53 07/12/2017   RETICCTABS 27.5 03/21/2014   Lab Results  Component Value Date   KPAFRELGTCHN 27.5 (H) 06/28/2017   LAMBDASER 18.6 06/28/2017   KAPLAMBRATIO 1.48 06/28/2017   Lab Results  Component Value Date   IGGSERUM 994 06/28/2017   IGGSERUM 1,026 06/28/2017   IGA 155 06/28/2017   IGA 157 06/28/2017   IGMSERUM 24 06/28/2017   IGMSERUM 24 06/28/2017   Lab Results  Component Value Date   TOTALPROTELP 6.4 06/28/2017   ALBUMINELP 3.5  06/28/2017   A1GS 0.2 06/28/2017   A2GS 1.0 06/28/2017   BETS 0.7 06/28/2017   BETA2SER 0.5 11/07/2014   GAMS 1.0 06/28/2017   MSPIKE 0.4 (H) 06/28/2017   SPEI Comment 05/31/2017     Chemistry      Component Value Date/Time   NA 139 06/28/2017 0749   NA 143 02/22/2017 0744   K 4.0 06/28/2017 0749   K 4.0 02/22/2017 0744   CL 107 06/28/2017 0749   CL 106 02/22/2017 0744   CO2 27 06/28/2017 0749   CO2 27 02/22/2017 0744   BUN 22 06/28/2017 0749   BUN 26 (H) 02/22/2017 0744   CREATININE 2.20 (H) 06/28/2017 0749   CREATININE 2.3 (H) 02/22/2017 0744      Component Value Date/Time   CALCIUM 8.8 06/28/2017 0749   CALCIUM 9.3 02/22/2017 0744   ALKPHOS 65 06/28/2017 0749   ALKPHOS 80 02/22/2017 0744   AST 23 06/28/2017 0749   ALT 23 06/28/2017 0749   ALT 22 02/22/2017 0744   BILITOT 0.6 06/28/2017 0749      Impression and Plan: Mr. Bumgarner is a very pleasant 67 yo African American gentleman with IgG kappa myeloma. He is having problems again with arthritis despite taking his pain medication as prescribed.  We will have him try Ultram and see if this doesn't help with inflammation. He will let us know if this does not improve.  We will proceed with treatment today as planned.  We will plan to see him back in another 2 weeks for follow-up.  He will contact our office with any questions or concerns. We can certainly see him sooner if need be.   Laverna Peace, NP 5/20/20198:30 AM

## 2017-07-13 LAB — IGG, IGA, IGM
IGA: 194 mg/dL (ref 61–437)
IGG (IMMUNOGLOBIN G), SERUM: 1219 mg/dL (ref 700–1600)
IgM (Immunoglobulin M), Srm: 30 mg/dL (ref 20–172)

## 2017-07-13 LAB — KAPPA/LAMBDA LIGHT CHAINS
Kappa free light chain: 38.7 mg/L — ABNORMAL HIGH (ref 3.3–19.4)
Kappa, lambda light chain ratio: 1.8 — ABNORMAL HIGH (ref 0.26–1.65)
Lambda free light chains: 21.5 mg/L (ref 5.7–26.3)

## 2017-07-16 ENCOUNTER — Other Ambulatory Visit: Payer: Self-pay | Admitting: *Deleted

## 2017-07-16 DIAGNOSIS — C9000 Multiple myeloma not having achieved remission: Secondary | ICD-10-CM

## 2017-07-16 LAB — IMMUNOFIXATION REFLEX, SERUM
IGA: 192 mg/dL (ref 61–437)
IGG (IMMUNOGLOBIN G), SERUM: 1201 mg/dL (ref 700–1600)
IgM (Immunoglobulin M), Srm: 27 mg/dL (ref 20–172)

## 2017-07-16 LAB — PROTEIN ELECTROPHORESIS, SERUM, WITH REFLEX
A/G Ratio: 1.2 (ref 0.7–1.7)
ALBUMIN ELP: 3.8 g/dL (ref 2.9–4.4)
ALPHA-1-GLOBULIN: 0.2 g/dL (ref 0.0–0.4)
ALPHA-2-GLOBULIN: 1 g/dL (ref 0.4–1.0)
Beta Globulin: 1 g/dL (ref 0.7–1.3)
GAMMA GLOBULIN: 1.1 g/dL (ref 0.4–1.8)
Globulin, Total: 3.3 g/dL (ref 2.2–3.9)
M-Spike, %: 0.4 g/dL — ABNORMAL HIGH
SPEP Interpretation: 0
TOTAL PROTEIN ELP: 7.1 g/dL (ref 6.0–8.5)

## 2017-07-20 ENCOUNTER — Inpatient Hospital Stay: Payer: Medicare Other

## 2017-07-20 ENCOUNTER — Other Ambulatory Visit: Payer: Self-pay

## 2017-07-20 VITALS — BP 137/65 | HR 64 | Temp 97.7°F | Resp 18 | Wt 188.0 lb

## 2017-07-20 DIAGNOSIS — C9000 Multiple myeloma not having achieved remission: Secondary | ICD-10-CM

## 2017-07-20 DIAGNOSIS — Z5112 Encounter for antineoplastic immunotherapy: Secondary | ICD-10-CM | POA: Diagnosis not present

## 2017-07-20 LAB — CMP (CANCER CENTER ONLY)
ALBUMIN: 3.5 g/dL (ref 3.5–5.0)
ALT: 35 U/L (ref 10–47)
AST: 28 U/L (ref 11–38)
Alkaline Phosphatase: 66 U/L (ref 26–84)
Anion gap: 7 (ref 5–15)
BILIRUBIN TOTAL: 0.6 mg/dL (ref 0.2–1.6)
BUN: 23 mg/dL — ABNORMAL HIGH (ref 7–22)
CHLORIDE: 105 mmol/L (ref 98–108)
CO2: 29 mmol/L (ref 18–33)
CREATININE: 2.4 mg/dL — AB (ref 0.60–1.20)
Calcium: 8.9 mg/dL (ref 8.0–10.3)
Glucose, Bld: 81 mg/dL (ref 73–118)
Potassium: 3.9 mmol/L (ref 3.3–4.7)
Sodium: 141 mmol/L (ref 128–145)
Total Protein: 7 g/dL (ref 6.4–8.1)

## 2017-07-20 LAB — CBC WITH DIFFERENTIAL (CANCER CENTER ONLY)
BASOS PCT: 1 %
Basophils Absolute: 0.1 10*3/uL (ref 0.0–0.1)
EOS ABS: 0.3 10*3/uL (ref 0.0–0.5)
EOS PCT: 8 %
HEMATOCRIT: 34.1 % — AB (ref 38.7–49.9)
Hemoglobin: 11.4 g/dL — ABNORMAL LOW (ref 13.0–17.1)
Lymphocytes Relative: 26 %
Lymphs Abs: 1.1 10*3/uL (ref 0.9–3.3)
MCH: 25.3 pg — ABNORMAL LOW (ref 28.0–33.4)
MCHC: 33.4 g/dL (ref 32.0–35.9)
MCV: 75.8 fL — ABNORMAL LOW (ref 82.0–98.0)
MONO ABS: 0.6 10*3/uL (ref 0.1–0.9)
Monocytes Relative: 15 %
NEUTROS ABS: 2.1 10*3/uL (ref 1.5–6.5)
Neutrophils Relative %: 50 %
Platelet Count: 180 10*3/uL (ref 145–400)
RBC: 4.5 MIL/uL (ref 4.20–5.70)
RDW: 18.1 % — AB (ref 11.1–15.7)
WBC Count: 4.2 10*3/uL (ref 4.0–10.0)

## 2017-07-20 MED ORDER — HYDROMORPHONE HCL 2 MG/ML IJ SOLN
2.0000 mg | Freq: Once | INTRAMUSCULAR | Status: AC
  Administered 2017-07-20: 2 mg via INTRAVENOUS

## 2017-07-20 MED ORDER — HEPARIN SOD (PORK) LOCK FLUSH 100 UNIT/ML IV SOLN
500.0000 [IU] | Freq: Once | INTRAVENOUS | Status: AC | PRN
  Administered 2017-07-20: 500 [IU]
  Filled 2017-07-20: qty 5

## 2017-07-20 MED ORDER — DEXAMETHASONE SODIUM PHOSPHATE 10 MG/ML IJ SOLN
10.0000 mg | Freq: Once | INTRAMUSCULAR | Status: AC
  Administered 2017-07-20: 10 mg via INTRAVENOUS

## 2017-07-20 MED ORDER — SODIUM CHLORIDE 0.9 % IV SOLN
300.0000 mg/m2 | Freq: Once | INTRAVENOUS | Status: AC
  Administered 2017-07-20: 620 mg via INTRAVENOUS
  Filled 2017-07-20: qty 31

## 2017-07-20 MED ORDER — SODIUM CHLORIDE 0.9 % IV SOLN
Freq: Once | INTRAVENOUS | Status: AC
  Administered 2017-07-20: 09:00:00 via INTRAVENOUS

## 2017-07-20 MED ORDER — PALONOSETRON HCL INJECTION 0.25 MG/5ML
INTRAVENOUS | Status: AC
  Filled 2017-07-20: qty 5

## 2017-07-20 MED ORDER — HYDROMORPHONE HCL 2 MG/ML IJ SOLN
INTRAMUSCULAR | Status: AC
  Filled 2017-07-20: qty 1

## 2017-07-20 MED ORDER — SODIUM CHLORIDE 0.9% FLUSH
10.0000 mL | INTRAVENOUS | Status: DC | PRN
  Administered 2017-07-20: 10 mL
  Filled 2017-07-20: qty 10

## 2017-07-20 MED ORDER — DEXAMETHASONE SODIUM PHOSPHATE 10 MG/ML IJ SOLN
INTRAMUSCULAR | Status: AC
  Filled 2017-07-20: qty 1

## 2017-07-20 MED ORDER — SODIUM CHLORIDE 0.9 % IJ SOLN
10.0000 mL | Freq: Once | INTRAMUSCULAR | Status: AC
  Administered 2017-07-20: 10 mL
  Filled 2017-07-20: qty 10

## 2017-07-20 MED ORDER — PALONOSETRON HCL INJECTION 0.25 MG/5ML
0.2500 mg | Freq: Once | INTRAVENOUS | Status: AC
  Administered 2017-07-20: 0.25 mg via INTRAVENOUS

## 2017-07-20 MED ORDER — SODIUM CHLORIDE 0.9 % IV SOLN: Freq: Once | INTRAVENOUS | Status: AC

## 2017-07-20 MED ORDER — DEXTROSE 5 % IV SOLN
29.0000 mg/m2 | Freq: Once | INTRAVENOUS | Status: AC
  Administered 2017-07-20: 60 mg via INTRAVENOUS
  Filled 2017-07-20: qty 30

## 2017-07-20 NOTE — Progress Notes (Signed)
OK to treat with creatinine of 2.4 per Dr [name]. dph

## 2017-07-20 NOTE — Patient Instructions (Signed)
Implanted Port Home Guide An implanted port is a type of central line that is placed under the skin. Central lines are used to provide IV access when treatment or nutrition needs to be given through a person's veins. Implanted ports are used for long-term IV access. An implanted port may be placed because:  You need IV medicine that would be irritating to the small veins in your hands or arms.  You need long-term IV medicines, such as antibiotics.  You need IV nutrition for a long period.  You need frequent blood draws for lab tests.  You need dialysis.  Implanted ports are usually placed in the chest area, but they can also be placed in the upper arm, the abdomen, or the leg. An implanted port has two main parts:  Reservoir. The reservoir is round and will appear as a small, raised area under your skin. The reservoir is the part where a needle is inserted to give medicines or draw blood.  Catheter. The catheter is a thin, flexible tube that extends from the reservoir. The catheter is placed into a large vein. Medicine that is inserted into the reservoir goes into the catheter and then into the vein.  How will I care for my incision site? Do not get the incision site wet. Bathe or shower as directed by your health care provider. How is my port accessed? Special steps must be taken to access the port:  Before the port is accessed, a numbing cream can be placed on the skin. This helps numb the skin over the port site.  Your health care provider uses a sterile technique to access the port. ? Your health care provider must put on a mask and sterile gloves. ? The skin over your port is cleaned carefully with an antiseptic and allowed to dry. ? The port is gently pinched between sterile gloves, and a needle is inserted into the port.  Only "non-coring" port needles should be used to access the port. Once the port is accessed, a blood return should be checked. This helps ensure that the port  is in the vein and is not clogged.  If your port needs to remain accessed for a constant infusion, a clear (transparent) bandage will be placed over the needle site. The bandage and needle will need to be changed every week, or as directed by your health care provider.  Keep the bandage covering the needle clean and dry. Do not get it wet. Follow your health care provider's instructions on how to take a shower or bath while the port is accessed.  If your port does not need to stay accessed, no bandage is needed over the port.  What is flushing? Flushing helps keep the port from getting clogged. Follow your health care provider's instructions on how and when to flush the port. Ports are usually flushed with saline solution or a medicine called heparin. The need for flushing will depend on how the port is used.  If the port is used for intermittent medicines or blood draws, the port will need to be flushed: ? After medicines have been given. ? After blood has been drawn. ? As part of routine maintenance.  If a constant infusion is running, the port may not need to be flushed.  How long will my port stay implanted? The port can stay in for as long as your health care provider thinks it is needed. When it is time for the port to come out, surgery will be   done to remove it. The procedure is similar to the one performed when the port was put in. When should I seek immediate medical care? When you have an implanted port, you should seek immediate medical care if:  You notice a bad smell coming from the incision site.  You have swelling, redness, or drainage at the incision site.  You have more swelling or pain at the port site or the surrounding area.  You have a fever that is not controlled with medicine.  This information is not intended to replace advice given to you by your health care provider. Make sure you discuss any questions you have with your health care provider. Document  Released: 02/09/2005 Document Revised: 07/18/2015 Document Reviewed: 10/17/2012 Elsevier Interactive Patient Education  2017 [employer]  

## 2017-07-20 NOTE — Patient Instructions (Signed)
Cyclophosphamide injection What is this medicine? CYCLOPHOSPHAMIDE (sye kloe FOSS fa mide) is a chemotherapy drug. It slows the growth of cancer cells. This medicine is used to treat many types of cancer like lymphoma, myeloma, leukemia, breast cancer, and ovarian cancer, to name a few. This medicine may be used for other purposes; ask your health care provider or pharmacist if you have questions. COMMON BRAND NAME(S): Cytoxan, Neosar What should I tell my health care provider before I take this medicine? They need to know if you have any of these conditions: -blood disorders -history of other chemotherapy -infection -kidney disease -liver disease -recent or ongoing radiation therapy -tumors in the bone marrow -an unusual or allergic reaction to cyclophosphamide, other chemotherapy, other medicines, foods, dyes, or preservatives -pregnant or trying to get pregnant -breast-feeding How should I use this medicine? This drug is usually given as an injection into a vein or muscle or by infusion into a vein. It is administered in a hospital or clinic by a specially trained health care professional. Talk to your pediatrician regarding the use of this medicine in children. Special care may be needed. Overdosage: If you think you have taken too much of this medicine contact a poison control center or emergency room at once. NOTE: This medicine is only for you. Do not share this medicine with others. What if I miss a dose? It is important not to miss your dose. Call your doctor or health care professional if you are unable to keep an appointment. What may interact with this medicine? This medicine may interact with the following medications: -amiodarone -amphotericin B -azathioprine -certain antiviral medicines for HIV or AIDS such as protease inhibitors (e.g., indinavir, ritonavir) and zidovudine -certain blood pressure medications such as benazepril, captopril, enalapril, fosinopril,  lisinopril, moexipril, monopril, perindopril, quinapril, ramipril, trandolapril -certain cancer medications such as anthracyclines (e.g., daunorubicin, doxorubicin), busulfan, cytarabine, paclitaxel, pentostatin, tamoxifen, trastuzumab -certain diuretics such as chlorothiazide, chlorthalidone, hydrochlorothiazide, indapamide, metolazone -certain medicines that treat or prevent blood clots like warfarin -certain muscle relaxants such as succinylcholine -cyclosporine -etanercept -indomethacin -medicines to increase blood counts like filgrastim, pegfilgrastim, sargramostim -medicines used as general anesthesia -metronidazole -natalizumab This list may not describe all possible interactions. Give your health care provider a list of all the medicines, herbs, non-prescription drugs, or dietary supplements you use. Also tell them if you smoke, drink alcohol, or use illegal drugs. Some items may interact with your medicine. What should I watch for while using this medicine? Visit your doctor for checks on your progress. This drug may make you feel generally unwell. This is not uncommon, as chemotherapy can affect healthy cells as well as cancer cells. Report any side effects. Continue your course of treatment even though you feel ill unless your doctor tells you to stop. Drink water or other fluids as directed. Urinate often, even at night. In some cases, you may be given additional medicines to help with side effects. Follow all directions for their use. Call your doctor or health care professional for advice if you get a fever, chills or sore throat, or other symptoms of a cold or flu. Do not treat yourself. This drug decreases your body's ability to fight infections. Try to avoid being around people who are sick. This medicine may increase your risk to bruise or bleed. Call your doctor or health care professional if you notice any unusual bleeding. Be careful brushing and flossing your teeth or using a  toothpick because you may get an infection or bleed   more easily. If you have any dental work done, tell your dentist you are receiving this medicine. You may get drowsy or dizzy. Do not drive, use machinery, or do anything that needs mental alertness until you know how this medicine affects you. Do not become pregnant while taking this medicine or for 1 year after stopping it. Women should inform their doctor if they wish to become pregnant or think they might be pregnant. Men should not father a child while taking this medicine and for 4 months after stopping it. There is a potential for serious side effects to an unborn child. Talk to your health care professional or pharmacist for more information. Do not breast-feed an infant while taking this medicine. This medicine may interfere with the ability to have a child. This medicine has caused ovarian failure in some women. This medicine has caused reduced sperm counts in some men. You should talk with your doctor or health care professional if you are concerned about your fertility. If you are going to have surgery, tell your doctor or health care professional that you have taken this medicine. What side effects may I notice from receiving this medicine? Side effects that you should report to your doctor or health care professional as soon as possible: -allergic reactions like skin rash, itching or hives, swelling of the face, lips, or tongue -low blood counts - this medicine may decrease the number of white blood cells, red blood cells and platelets. You may be at increased risk for infections and bleeding. -signs of infection - fever or chills, cough, sore throat, pain or difficulty passing urine -signs of decreased platelets or bleeding - bruising, pinpoint red spots on the skin, black, tarry stools, blood in the urine -signs of decreased red blood cells - unusually weak or tired, fainting spells, lightheadedness -breathing problems -dark  urine -dizziness -palpitations -swelling of the ankles, feet, hands -trouble passing urine or change in the amount of urine -weight gain -yellowing of the eyes or skin Side effects that usually do not require medical attention (report to your doctor or health care professional if they continue or are bothersome): -changes in nail or skin color -hair loss -missed menstrual periods -mouth sores -nausea, vomiting This list may not describe all possible side effects. Call your doctor for medical advice about side effects. You may report side effects to FDA at 1-800-FDA-1088. Where should I keep my medicine? This drug is given in a hospital or clinic and will not be stored at home. NOTE: This sheet is a summary. It may not cover all possible information. If you have questions about this medicine, talk to your doctor, pharmacist, or health care provider.  2018 Elsevier/Gold Standard (2011-12-25 16:22:58) Carfilzomib injection What is this medicine? CARFILZOMIB (kar FILZ oh mib) targets a specific protein within cancer cells and stops the cancer cells from growing. It is used to treat multiple myeloma. This medicine may be used for other purposes; ask your health care provider or pharmacist if you have questions. COMMON BRAND NAME(S): KYPROLIS What should I tell my health care provider before I take this medicine? They need to know if you have any of these conditions: -heart disease -history of blood clots -irregular heartbeat -kidney disease -liver disease -lung or breathing disease -an unusual or allergic reaction to carfilzomib, or other medicines, foods, dyes, or preservatives -pregnant or trying to get pregnant -breast-feeding How should I use this medicine? This medicine is for injection or infusion into a vein. It is given  by a health care professional in a hospital or clinic setting. Talk to your pediatrician regarding the use of this medicine in children. Special care may be  needed. Overdosage: If you think you have taken too much of this medicine contact a poison control center or emergency room at once. NOTE: This medicine is only for you. Do not share this medicine with others. What if I miss a dose? It is important not to miss your dose. Call your doctor or health care professional if you are unable to keep an appointment. What may interact with this medicine? Interactions are not expected. Give your health care provider a list of all the medicines, herbs, non-prescription drugs, or dietary supplements you use. Also tell them if you smoke, drink alcohol, or use illegal drugs. Some items may interact with your medicine. This list may not describe all possible interactions. Give your health care provider a list of all the medicines, herbs, non-prescription drugs, or dietary supplements you use. Also tell them if you smoke, drink alcohol, or use illegal drugs. Some items may interact with your medicine. What should I watch for while using this medicine? Your condition will be monitored carefully while you are receiving this medicine. Report any side effects. Continue your course of treatment even though you feel ill unless your doctor tells you to stop. You may need blood work done while you are taking this medicine. Do not become pregnant while taking this medicine or for at least 30 days after stopping it. Women should inform their doctor if they wish to become pregnant or think they might be pregnant. There is a potential for serious side effects to an unborn child. Men should not father a child while taking this medicine and for 90 days after stopping it. Talk to your health care professional or pharmacist for more information. Do not breast-feed an infant while taking this medicine. Check with your doctor or health care professional if you get an attack of severe diarrhea, nausea and vomiting, or if you sweat a lot. The loss of too much body fluid can make it  dangerous for you to take this medicine. You may get dizzy. Do not drive, use machinery, or do anything that needs mental alertness until you know how this medicine affects you. Do not stand or sit up quickly, especially if you are an older patient. This reduces the risk of dizzy or fainting spells. What side effects may I notice from receiving this medicine? Side effects that you should report to your doctor or health care professional as soon as possible: -allergic reactions like skin rash, itching or hives, swelling of the face, lips, or tongue -confusion -dizziness -feeling faint or lightheaded -fever or chills -palpitations -seizures -signs and symptoms of bleeding such as bloody or black, tarry stools; red or dark-brown urine; spitting up blood or brown material that looks like coffee grounds; red spots on the skin; unusual bruising or bleeding including from the eye, gums, or nose -signs and symptoms of a blood clot such as breathing problems; changes in vision; chest pain; severe, sudden headache; pain, swelling, warmth in the leg; trouble speaking; sudden numbness or weakness of the face, arm or leg -signs and symptoms of kidney injury like trouble passing urine or change in the amount of urine -signs and symptoms of liver injury like dark yellow or brown urine; general ill feeling or flu-like symptoms; light-colored stools; loss of appetite; nausea; right upper belly pain; unusually weak or tired; yellowing of the   eyes or skin Side effects that usually do not require medical attention (report to your doctor or health care professional if they continue or are bothersome): -back pain -cough -diarrhea -headache -muscle cramps -vomiting This list may not describe all possible side effects. Call your doctor for medical advice about side effects. You may report side effects to FDA at 1-800-FDA-1088. Where should I keep my medicine? This drug is given in a hospital or clinic and will not  be stored at home. NOTE: This sheet is a summary. It may not cover all possible information. If you have questions about this medicine, talk to your doctor, pharmacist, or health care provider.  2018 Elsevier/Gold Standard (2015-03-14 13:39:23)  

## 2017-07-26 ENCOUNTER — Inpatient Hospital Stay: Payer: Medicare Other

## 2017-07-26 ENCOUNTER — Other Ambulatory Visit: Payer: Self-pay

## 2017-07-26 ENCOUNTER — Inpatient Hospital Stay (HOSPITAL_BASED_OUTPATIENT_CLINIC_OR_DEPARTMENT_OTHER): Payer: Medicare Other | Admitting: Hematology & Oncology

## 2017-07-26 ENCOUNTER — Other Ambulatory Visit: Payer: Self-pay | Admitting: Family

## 2017-07-26 ENCOUNTER — Inpatient Hospital Stay: Payer: Medicare Other | Attending: Hematology & Oncology

## 2017-07-26 VITALS — BP 124/64 | HR 58 | Temp 98.2°F | Resp 17 | Wt 185.1 lb

## 2017-07-26 DIAGNOSIS — D631 Anemia in chronic kidney disease: Secondary | ICD-10-CM | POA: Insufficient documentation

## 2017-07-26 DIAGNOSIS — Z5111 Encounter for antineoplastic chemotherapy: Secondary | ICD-10-CM | POA: Diagnosis not present

## 2017-07-26 DIAGNOSIS — D509 Iron deficiency anemia, unspecified: Secondary | ICD-10-CM | POA: Diagnosis not present

## 2017-07-26 DIAGNOSIS — E349 Endocrine disorder, unspecified: Secondary | ICD-10-CM

## 2017-07-26 DIAGNOSIS — N189 Chronic kidney disease, unspecified: Secondary | ICD-10-CM

## 2017-07-26 DIAGNOSIS — D500 Iron deficiency anemia secondary to blood loss (chronic): Secondary | ICD-10-CM

## 2017-07-26 DIAGNOSIS — C9000 Multiple myeloma not having achieved remission: Secondary | ICD-10-CM

## 2017-07-26 DIAGNOSIS — Z5112 Encounter for antineoplastic immunotherapy: Secondary | ICD-10-CM | POA: Diagnosis present

## 2017-07-26 DIAGNOSIS — Z299 Encounter for prophylactic measures, unspecified: Secondary | ICD-10-CM

## 2017-07-26 LAB — CMP (CANCER CENTER ONLY)
ALBUMIN: 3.6 g/dL (ref 3.5–5.0)
ALT: 37 U/L (ref 10–47)
AST: 28 U/L (ref 11–38)
Alkaline Phosphatase: 74 U/L (ref 26–84)
Anion gap: 11 (ref 5–15)
BUN: 24 mg/dL — AB (ref 7–22)
CHLORIDE: 104 mmol/L (ref 98–108)
CO2: 29 mmol/L (ref 18–33)
CREATININE: 2.4 mg/dL — AB (ref 0.60–1.20)
Calcium: 8.8 mg/dL (ref 8.0–10.3)
Glucose, Bld: 110 mg/dL (ref 73–118)
POTASSIUM: 4.1 mmol/L (ref 3.3–4.7)
SODIUM: 144 mmol/L (ref 128–145)
Total Bilirubin: 0.6 mg/dL (ref 0.2–1.6)
Total Protein: 7 g/dL (ref 6.4–8.1)

## 2017-07-26 LAB — CBC WITH DIFFERENTIAL (CANCER CENTER ONLY)
Basophils Absolute: 0 10*3/uL (ref 0.0–0.1)
Basophils Relative: 1 %
EOS ABS: 0.4 10*3/uL (ref 0.0–0.5)
EOS PCT: 7 %
HCT: 33.6 % — ABNORMAL LOW (ref 38.7–49.9)
Hemoglobin: 11 g/dL — ABNORMAL LOW (ref 13.0–17.1)
LYMPHS ABS: 1 10*3/uL (ref 0.9–3.3)
Lymphocytes Relative: 18 %
MCH: 24.7 pg — AB (ref 28.0–33.4)
MCHC: 32.7 g/dL (ref 32.0–35.9)
MCV: 75.3 fL — ABNORMAL LOW (ref 82.0–98.0)
MONO ABS: 0.5 10*3/uL (ref 0.1–0.9)
MONOS PCT: 10 %
Neutro Abs: 3.4 10*3/uL (ref 1.5–6.5)
Neutrophils Relative %: 64 %
Platelet Count: 188 10*3/uL (ref 145–400)
RBC: 4.46 MIL/uL (ref 4.20–5.70)
RDW: 18.4 % — AB (ref 11.1–15.7)
WBC Count: 5.3 10*3/uL (ref 4.0–10.0)

## 2017-07-26 LAB — IRON AND TIBC
Iron: 135 ug/dL (ref 42–163)
Saturation Ratios: 70 % (ref 42–163)
TIBC: 194 ug/dL — ABNORMAL LOW (ref 202–409)
UIBC: 58 ug/dL

## 2017-07-26 LAB — LACTATE DEHYDROGENASE: LDH: 205 U/L (ref 125–245)

## 2017-07-26 LAB — FERRITIN: Ferritin: 2313 ng/mL — ABNORMAL HIGH (ref 22–316)

## 2017-07-26 MED ORDER — SODIUM CHLORIDE 0.9% FLUSH
10.0000 mL | INTRAVENOUS | Status: DC | PRN
  Administered 2017-07-26: 10 mL
  Filled 2017-07-26: qty 10

## 2017-07-26 MED ORDER — DEXAMETHASONE SODIUM PHOSPHATE 10 MG/ML IJ SOLN
10.0000 mg | Freq: Once | INTRAMUSCULAR | Status: AC
  Administered 2017-07-26: 10 mg via INTRAVENOUS

## 2017-07-26 MED ORDER — PALONOSETRON HCL INJECTION 0.25 MG/5ML
0.2500 mg | Freq: Once | INTRAVENOUS | Status: AC
  Administered 2017-07-26: 0.25 mg via INTRAVENOUS

## 2017-07-26 MED ORDER — SODIUM CHLORIDE 0.9 % IV SOLN: Freq: Once | INTRAVENOUS | Status: AC

## 2017-07-26 MED ORDER — TESTOSTERONE CYPIONATE 200 MG/ML IM SOLN
300.0000 mg | Freq: Once | INTRAMUSCULAR | Status: AC
  Administered 2017-07-26: 300 mg via INTRAMUSCULAR

## 2017-07-26 MED ORDER — HEPARIN SOD (PORK) LOCK FLUSH 100 UNIT/ML IV SOLN
500.0000 [IU] | Freq: Once | INTRAVENOUS | Status: AC | PRN
  Administered 2017-07-26: 500 [IU]
  Filled 2017-07-26: qty 5

## 2017-07-26 MED ORDER — DEXAMETHASONE SODIUM PHOSPHATE 10 MG/ML IJ SOLN
INTRAMUSCULAR | Status: AC
  Filled 2017-07-26: qty 1

## 2017-07-26 MED ORDER — PALONOSETRON HCL INJECTION 0.25 MG/5ML
INTRAVENOUS | Status: AC
  Filled 2017-07-26: qty 5

## 2017-07-26 MED ORDER — DEXTROSE 5 % IV SOLN
29.0000 mg/m2 | Freq: Once | INTRAVENOUS | Status: AC
  Administered 2017-07-26: 60 mg via INTRAVENOUS
  Filled 2017-07-26: qty 30

## 2017-07-26 MED ORDER — HYDROMORPHONE HCL 1 MG/ML IJ SOLN
INTRAMUSCULAR | Status: AC
  Filled 2017-07-26: qty 2

## 2017-07-26 MED ORDER — SODIUM CHLORIDE 0.9 % IV SOLN
Freq: Once | INTRAVENOUS | Status: AC
  Administered 2017-07-26: 10:00:00 via INTRAVENOUS

## 2017-07-26 MED ORDER — HYDROMORPHONE HCL 4 MG/ML IJ SOLN
2.0000 mg | Freq: Once | INTRAMUSCULAR | Status: AC
  Administered 2017-07-26: 2 mg via INTRAVENOUS

## 2017-07-26 MED ORDER — TESTOSTERONE CYPIONATE 200 MG/ML IM SOLN
INTRAMUSCULAR | Status: AC
  Filled 2017-07-26: qty 2

## 2017-07-26 MED ORDER — SODIUM CHLORIDE 0.9 % IV SOLN
300.0000 mg/m2 | Freq: Once | INTRAVENOUS | Status: AC
  Administered 2017-07-26: 620 mg via INTRAVENOUS
  Filled 2017-07-26: qty 31

## 2017-07-26 NOTE — Patient Instructions (Signed)
Cyclophosphamide injection What is this medicine? CYCLOPHOSPHAMIDE (sye kloe FOSS fa mide) is a chemotherapy drug. It slows the growth of cancer cells. This medicine is used to treat many types of cancer like lymphoma, myeloma, leukemia, breast cancer, and ovarian cancer, to name a few. This medicine may be used for other purposes; ask your health care provider or pharmacist if you have questions. COMMON BRAND NAME(S): Cytoxan, Neosar What should I tell my health care provider before I take this medicine? They need to know if you have any of these conditions: -blood disorders -history of other chemotherapy -infection -kidney disease -liver disease -recent or ongoing radiation therapy -tumors in the bone marrow -an unusual or allergic reaction to cyclophosphamide, other chemotherapy, other medicines, foods, dyes, or preservatives -pregnant or trying to get pregnant -breast-feeding How should I use this medicine? This drug is usually given as an injection into a vein or muscle or by infusion into a vein. It is administered in a hospital or clinic by a specially trained health care professional. Talk to your pediatrician regarding the use of this medicine in children. Special care may be needed. Overdosage: If you think you have taken too much of this medicine contact a poison control center or emergency room at once. NOTE: This medicine is only for you. Do not share this medicine with others. What if I miss a dose? It is important not to miss your dose. Call your doctor or health care professional if you are unable to keep an appointment. What may interact with this medicine? This medicine may interact with the following medications: -amiodarone -amphotericin B -azathioprine -certain antiviral medicines for HIV or AIDS such as protease inhibitors (e.g., indinavir, ritonavir) and zidovudine -certain blood pressure medications such as benazepril, captopril, enalapril, fosinopril,  lisinopril, moexipril, monopril, perindopril, quinapril, ramipril, trandolapril -certain cancer medications such as anthracyclines (e.g., daunorubicin, doxorubicin), busulfan, cytarabine, paclitaxel, pentostatin, tamoxifen, trastuzumab -certain diuretics such as chlorothiazide, chlorthalidone, hydrochlorothiazide, indapamide, metolazone -certain medicines that treat or prevent blood clots like warfarin -certain muscle relaxants such as succinylcholine -cyclosporine -etanercept -indomethacin -medicines to increase blood counts like filgrastim, pegfilgrastim, sargramostim -medicines used as general anesthesia -metronidazole -natalizumab This list may not describe all possible interactions. Give your health care provider a list of all the medicines, herbs, non-prescription drugs, or dietary supplements you use. Also tell them if you smoke, drink alcohol, or use illegal drugs. Some items may interact with your medicine. What should I watch for while using this medicine? Visit your doctor for checks on your progress. This drug may make you feel generally unwell. This is not uncommon, as chemotherapy can affect healthy cells as well as cancer cells. Report any side effects. Continue your course of treatment even though you feel ill unless your doctor tells you to stop. Drink water or other fluids as directed. Urinate often, even at night. In some cases, you may be given additional medicines to help with side effects. Follow all directions for their use. Call your doctor or health care professional for advice if you get a fever, chills or sore throat, or other symptoms of a cold or flu. Do not treat yourself. This drug decreases your body's ability to fight infections. Try to avoid being around people who are sick. This medicine may increase your risk to bruise or bleed. Call your doctor or health care professional if you notice any unusual bleeding. Be careful brushing and flossing your teeth or using a  toothpick because you may get an infection or bleed   more easily. If you have any dental work done, tell your dentist you are receiving this medicine. You may get drowsy or dizzy. Do not drive, use machinery, or do anything that needs mental alertness until you know how this medicine affects you. Do not become pregnant while taking this medicine or for 1 year after stopping it. Women should inform their doctor if they wish to become pregnant or think they might be pregnant. Men should not father a child while taking this medicine and for 4 months after stopping it. There is a potential for serious side effects to an unborn child. Talk to your health care professional or pharmacist for more information. Do not breast-feed an infant while taking this medicine. This medicine may interfere with the ability to have a child. This medicine has caused ovarian failure in some women. This medicine has caused reduced sperm counts in some men. You should talk with your doctor or health care professional if you are concerned about your fertility. If you are going to have surgery, tell your doctor or health care professional that you have taken this medicine. What side effects may I notice from receiving this medicine? Side effects that you should report to your doctor or health care professional as soon as possible: -allergic reactions like skin rash, itching or hives, swelling of the face, lips, or tongue -low blood counts - this medicine may decrease the number of white blood cells, red blood cells and platelets. You may be at increased risk for infections and bleeding. -signs of infection - fever or chills, cough, sore throat, pain or difficulty passing urine -signs of decreased platelets or bleeding - bruising, pinpoint red spots on the skin, black, tarry stools, blood in the urine -signs of decreased red blood cells - unusually weak or tired, fainting spells, lightheadedness -breathing problems -dark  urine -dizziness -palpitations -swelling of the ankles, feet, hands -trouble passing urine or change in the amount of urine -weight gain -yellowing of the eyes or skin Side effects that usually do not require medical attention (report to your doctor or health care professional if they continue or are bothersome): -changes in nail or skin color -hair loss -missed menstrual periods -mouth sores -nausea, vomiting This list may not describe all possible side effects. Call your doctor for medical advice about side effects. You may report side effects to FDA at 1-800-FDA-1088. Where should I keep my medicine? This drug is given in a hospital or clinic and will not be stored at home. NOTE: This sheet is a summary. It may not cover all possible information. If you have questions about this medicine, talk to your doctor, pharmacist, or health care provider.  2018 Elsevier/Gold Standard (2011-12-25 16:22:58) Carfilzomib injection What is this medicine? CARFILZOMIB (kar FILZ oh mib) targets a specific protein within cancer cells and stops the cancer cells from growing. It is used to treat multiple myeloma. This medicine may be used for other purposes; ask your health care provider or pharmacist if you have questions. COMMON BRAND NAME(S): KYPROLIS What should I tell my health care provider before I take this medicine? They need to know if you have any of these conditions: -heart disease -history of blood clots -irregular heartbeat -kidney disease -liver disease -lung or breathing disease -an unusual or allergic reaction to carfilzomib, or other medicines, foods, dyes, or preservatives -pregnant or trying to get pregnant -breast-feeding How should I use this medicine? This medicine is for injection or infusion into a vein. It is given  by a health care professional in a hospital or clinic setting. Talk to your pediatrician regarding the use of this medicine in children. Special care may be  needed. Overdosage: If you think you have taken too much of this medicine contact a poison control center or emergency room at once. NOTE: This medicine is only for you. Do not share this medicine with others. What if I miss a dose? It is important not to miss your dose. Call your doctor or health care professional if you are unable to keep an appointment. What may interact with this medicine? Interactions are not expected. Give your health care provider a list of all the medicines, herbs, non-prescription drugs, or dietary supplements you use. Also tell them if you smoke, drink alcohol, or use illegal drugs. Some items may interact with your medicine. This list may not describe all possible interactions. Give your health care provider a list of all the medicines, herbs, non-prescription drugs, or dietary supplements you use. Also tell them if you smoke, drink alcohol, or use illegal drugs. Some items may interact with your medicine. What should I watch for while using this medicine? Your condition will be monitored carefully while you are receiving this medicine. Report any side effects. Continue your course of treatment even though you feel ill unless your doctor tells you to stop. You may need blood work done while you are taking this medicine. Do not become pregnant while taking this medicine or for at least 30 days after stopping it. Women should inform their doctor if they wish to become pregnant or think they might be pregnant. There is a potential for serious side effects to an unborn child. Men should not father a child while taking this medicine and for 90 days after stopping it. Talk to your health care professional or pharmacist for more information. Do not breast-feed an infant while taking this medicine. Check with your doctor or health care professional if you get an attack of severe diarrhea, nausea and vomiting, or if you sweat a lot. The loss of too much body fluid can make it  dangerous for you to take this medicine. You may get dizzy. Do not drive, use machinery, or do anything that needs mental alertness until you know how this medicine affects you. Do not stand or sit up quickly, especially if you are an older patient. This reduces the risk of dizzy or fainting spells. What side effects may I notice from receiving this medicine? Side effects that you should report to your doctor or health care professional as soon as possible: -allergic reactions like skin rash, itching or hives, swelling of the face, lips, or tongue -confusion -dizziness -feeling faint or lightheaded -fever or chills -palpitations -seizures -signs and symptoms of bleeding such as bloody or black, tarry stools; red or dark-brown urine; spitting up blood or brown material that looks like coffee grounds; red spots on the skin; unusual bruising or bleeding including from the eye, gums, or nose -signs and symptoms of a blood clot such as breathing problems; changes in vision; chest pain; severe, sudden headache; pain, swelling, warmth in the leg; trouble speaking; sudden numbness or weakness of the face, arm or leg -signs and symptoms of kidney injury like trouble passing urine or change in the amount of urine -signs and symptoms of liver injury like dark yellow or brown urine; general ill feeling or flu-like symptoms; light-colored stools; loss of appetite; nausea; right upper belly pain; unusually weak or tired; yellowing of the   eyes or skin Side effects that usually do not require medical attention (report to your doctor or health care professional if they continue or are bothersome): -back pain -cough -diarrhea -headache -muscle cramps -vomiting This list may not describe all possible side effects. Call your doctor for medical advice about side effects. You may report side effects to FDA at 1-800-FDA-1088. Where should I keep my medicine? This drug is given in a hospital or clinic and will not  be stored at home. NOTE: This sheet is a summary. It may not cover all possible information. If you have questions about this medicine, talk to your doctor, pharmacist, or health care provider.  2018 Elsevier/Gold Standard (2015-03-14 13:39:23)  

## 2017-07-26 NOTE — Patient Instructions (Signed)
Implanted Port Home Guide An implanted port is a type of central line that is placed under the skin. Central lines are used to provide IV access when treatment or nutrition needs to be given through a person's veins. Implanted ports are used for long-term IV access. An implanted port may be placed because:  You need IV medicine that would be irritating to the small veins in your hands or arms.  You need long-term IV medicines, such as antibiotics.  You need IV nutrition for a long period.  You need frequent blood draws for lab tests.  You need dialysis.  Implanted ports are usually placed in the chest area, but they can also be placed in the upper arm, the abdomen, or the leg. An implanted port has two main parts:  Reservoir. The reservoir is round and will appear as a small, raised area under your skin. The reservoir is the part where a needle is inserted to give medicines or draw blood.  Catheter. The catheter is a thin, flexible tube that extends from the reservoir. The catheter is placed into a large vein. Medicine that is inserted into the reservoir goes into the catheter and then into the vein.  How will I care for my incision site? Do not get the incision site wet. Bathe or shower as directed by your health care provider. How is my port accessed? Special steps must be taken to access the port:  Before the port is accessed, a numbing cream can be placed on the skin. This helps numb the skin over the port site.  Your health care provider uses a sterile technique to access the port. ? Your health care provider must put on a mask and sterile gloves. ? The skin over your port is cleaned carefully with an antiseptic and allowed to dry. ? The port is gently pinched between sterile gloves, and a needle is inserted into the port.  Only "non-coring" port needles should be used to access the port. Once the port is accessed, a blood return should be checked. This helps ensure that the port  is in the vein and is not clogged.  If your port needs to remain accessed for a constant infusion, a clear (transparent) bandage will be placed over the needle site. The bandage and needle will need to be changed every week, or as directed by your health care provider.  Keep the bandage covering the needle clean and dry. Do not get it wet. Follow your health care provider's instructions on how to take a shower or bath while the port is accessed.  If your port does not need to stay accessed, no bandage is needed over the port.  What is flushing? Flushing helps keep the port from getting clogged. Follow your health care provider's instructions on how and when to flush the port. Ports are usually flushed with saline solution or a medicine called heparin. The need for flushing will depend on how the port is used.  If the port is used for intermittent medicines or blood draws, the port will need to be flushed: ? After medicines have been given. ? After blood has been drawn. ? As part of routine maintenance.  If a constant infusion is running, the port may not need to be flushed.  How long will my port stay implanted? The port can stay in for as long as your health care provider thinks it is needed. When it is time for the port to come out, surgery will be   done to remove it. The procedure is similar to the one performed when the port was put in. When should I seek immediate medical care? When you have an implanted port, you should seek immediate medical care if:  You notice a bad smell coming from the incision site.  You have swelling, redness, or drainage at the incision site.  You have more swelling or pain at the port site or the surrounding area.  You have a fever that is not controlled with medicine.  This information is not intended to replace advice given to you by your health care provider. Make sure you discuss any questions you have with your health care provider. Document  Released: 02/09/2005 Document Revised: 07/18/2015 Document Reviewed: 10/17/2012 Elsevier Interactive Patient Education  2017 [employer]  

## 2017-07-26 NOTE — Progress Notes (Signed)
Hematology and Oncology Follow Up Visit  [name] [id number] Oct 18, 1950 67 y.o. 07/26/2017   Principle Diagnosis:  IgG kappa myeloma Anemia secondary to renal insufficiency Intermittent iron - deficiency anemia Hypotestosteronemia  Current Therapy:   Aredia 60 mg IV q 2 months  Aranesp 300 mcg subcu as needed for hemoglobin less than 11 Depo-Testosterone 300 mg q 4 weeks IV iron as indicated Kyprolis/Cytoxan - s/p cycle#6   Interim History:  [name] is here today for follow-up and treatment. He is having generalized aches and pain in his joints and lower back due to arthritis. He states that this does not improve with the Oxycodone or percocet.  He is not sleeping well due to this pain.  He completed his Omnicef. No fever, chills, cough, rash, dizziness, SOB, chest pain, palpitations, abdominal pain or changes in bowel or bladder habits. No incontinence of bowel or bladder.  Earlier this month his M-spike was 0.4, IgG level 1219 mg/dL and kappa light chains 3.9 mg/dL.   No weakness, swelling, tenderness, numbness or tingling in her extremities. No falls or syncopal episodes.  No episodes of bleeding, no bruising or petechiae. No lymphadenopathy found on exam.  His appetite is down. He is trying to stay well hydrated. His weight is stable.   ECOG Performance Status: 1 - Symptomatic but completely ambulatory  Medications:  Allergies as of 07/26/2017      Reactions   Iodinated Diagnostic Agents Rash   Patient states he was instructed not to take IV contrast.  In 2008 he had an unknown reaction, and was told not to take it again.  He was also told not to take it due to his kidneys.   Iodine Anxiety, Rash, Other (See Comments)   Didn't feel right "instructed not to take per MD--something with his port" Didn't feel right Didn't feel right "instructed not to take per MD--something with his port"      Medication List        Accurate as of 07/26/17  9:20 AM. Always use your  most recent med list.          amLODipine 10 MG tablet Commonly known as:  NORVASC Take 10 mg by mouth daily.   CARAFATE 1 GM/10ML suspension Generic drug:  sucralfate Take 1 g by mouth 4 (four) times daily.   cefdinir 300 MG capsule Commonly known as:  OMNICEF Take 1 capsule (300 mg total) by mouth 2 (two) times daily.   CLEAR EYES ALL SEASONS 5-6 MG/ML Soln Generic drug:  Polyvinyl Alcohol-Povidone Place 2 drops into both eyes 3 (three) times daily as needed (drye eyes.).   dicyclomine 10 MG capsule Commonly known as:  BENTYL Take 1 capsule (10 mg total) by mouth 3 (three) times daily.   EMBEDA 20-0.8 MG Cpcr Generic drug:  Morphine-Naltrexone Take 1 capsule by mouth 3 (three) times daily.   EMBEDA 30-1.2 MG Cpcr Generic drug:  Morphine-Naltrexone Take 3 (three) times daily by mouth.   furosemide 40 MG tablet Commonly known as:  LASIX Take 40 mg by mouth.   glipiZIDE 5 MG tablet Commonly known as:  GLUCOTROL Take 5 mg by mouth 2 (two) times daily before a meal.   isosorbide mononitrate 30 MG 24 hr tablet Commonly known as:  IMDUR Take 30 mg by mouth daily.   latanoprost 0.005 % ophthalmic solution Commonly known as:  XALATAN Place 1 drop into the right eye every evening.   lidocaine-prilocaine cream Commonly known as:  EMLA Apply 1 application  topically as needed.   linaclotide 145 MCG Caps capsule Commonly known as:  LINZESS Take 1 capsule (145 mcg total) by mouth daily before breakfast.   LORazepam 0.5 MG tablet Commonly known as:  ATIVAN Take 1 tablet (0.5 mg total) by mouth every 6 (six) hours as needed (Nausea or vomiting).   losartan 25 MG tablet Commonly known as:  COZAAR Take 25 mg by mouth.   meclizine 50 MG tablet Commonly known as:  ANTIVERT Take 0.5 tablets (25 mg total) by mouth 4 (four) times daily as needed for dizziness.   methocarbamol 500 MG tablet Commonly known as:  ROBAXIN Take 500 mg by mouth 4 (four) times daily.     mirabegron ER 50 MG Tb24 tablet Commonly known as:  MYRBETRIQ Take 50 mg by mouth daily.   omeprazole 40 MG capsule Commonly known as:  PRILOSEC take 1 capsule by mouth twice a day   ondansetron 4 MG disintegrating tablet Commonly known as:  ZOFRAN ODT Take 1 tablet (4 mg total) by mouth every 8 (eight) hours as needed for nausea or vomiting.   ondansetron 8 MG tablet Commonly known as:  ZOFRAN Take 1 tablet (8 mg total) by mouth 2 (two) times daily as needed (Nausea or vomiting).   orphenadrine 100 MG tablet Commonly known as:  NORFLEX Take 1 tablet (100 mg total) by mouth 2 (two) times daily.   oxyCODONE 20 mg 12 hr tablet Commonly known as:  OXYCONTIN Take 1 tablet (20 mg total) by mouth every 12 (twelve) hours.   oxyCODONE-acetaminophen 10-325 MG tablet Commonly known as:  PERCOCET Take 1 tablet by mouth every 6 (six) hours as needed for pain.   potassium chloride SA 20 MEQ tablet Commonly known as:  K-DUR,KLOR-CON Take 1 tablet (20 mEq total) by mouth 2 (two) times daily.   prochlorperazine 10 MG tablet Commonly known as:  COMPAZINE take 1 tablet by mouth every 6 hours if needed for nausea and vomiting   TOPROL XL 50 MG 24 hr tablet Generic drug:  metoprolol succinate Take 50 mg by mouth daily.   traMADol 50 MG tablet Commonly known as:  ULTRAM Take 1 tablet (50 mg total) by mouth every 6 (six) hours as needed.   Vitamin D (Ergocalciferol) 50000 units Caps capsule Commonly known as:  DRISDOL Take 50,000 Units by mouth every Saturday.   ZIOPTAN 0.0015 % Soln Generic drug:  Tafluprost Place 1 drop into both eyes daily.       Allergies:  Allergies  Allergen Reactions  . Iodinated Diagnostic Agents Rash    Patient states he was instructed not to take IV contrast.  In 2008 he had an unknown reaction, and was told not to take it again.  He was also told not to take it due to his kidneys.  . Iodine Anxiety, Rash and Other (See Comments)    Didn't feel  right "instructed not to take per MD--something with his port" Didn't feel right Didn't feel right "instructed not to take per MD--something with his port"    Past Medical History, Surgical history, Social history, and Family History were reviewed and updated.  Review of Systems: All other 10 point review of systems is negative.   Physical Exam:  weight is 185 lb 1.3 oz (84 kg). His oral temperature is 98.2 F (36.8 C). His blood pressure is 124/64 and his pulse is 58 (abnormal). His respiration is 17 and oxygen saturation is 100%.   Wt Readings from Last 3 Encounters:  07/26/17 185 lb 1.3 oz (84 kg)  07/20/17 188 lb (85.3 kg)  07/12/17 190 lb (86.2 kg)    Ocular: Sclerae unicteric, pupils equal, round and reactive to light Ear-nose-throat: Oropharynx clear, dentition fair Lymphatic: No cervical, supraclavicular or axillary adenopathy Lungs no rales or rhonchi, good excursion bilaterally Heart regular rate and rhythm, no murmur appreciated Abd soft, nontender, positive bowel sounds, no liver or spleen tip palpated on exam, no fluid wave  MSK no focal spinal tenderness, no joint edema Neuro: non-focal, well-oriented, appropriate affect Breasts: Deferred   Lab Results  Component Value Date   WBC 4.2 07/20/2017   HGB 11.4 (L) 07/20/2017   HCT 34.1 (L) 07/20/2017   MCV 75.8 (L) 07/20/2017   PLT 180 07/20/2017   Lab Results  Component Value Date   FERRITIN 2,383 (H) 07/12/2017   IRON 216 (H) 07/12/2017   TIBC 190 (L) 07/12/2017   UIBC UNABLE TO CALCULATE 07/12/2017   IRONPCTSAT 114 07/12/2017   Lab Results  Component Value Date   RETICCTPCT 0.6 03/21/2014   RBC 4.50 07/20/2017   RETICCTABS 27.5 03/21/2014   Lab Results  Component Value Date   KPAFRELGTCHN 38.7 (H) 07/12/2017   LAMBDASER 21.5 07/12/2017   KAPLAMBRATIO 1.80 (H) 07/12/2017   Lab Results  Component Value Date   IGGSERUM 1,219 07/12/2017   IGGSERUM 1,201 07/12/2017   IGA 194 07/12/2017   IGA 192  07/12/2017   IGMSERUM 30 07/12/2017   IGMSERUM 27 07/12/2017   Lab Results  Component Value Date   TOTALPROTELP 7.1 07/12/2017   ALBUMINELP 3.8 07/12/2017   A1GS 0.2 07/12/2017   A2GS 1.0 07/12/2017   BETS 1.0 07/12/2017   BETA2SER 0.5 11/07/2014   GAMS 1.1 07/12/2017   MSPIKE 0.4 (H) 07/12/2017   SPEI Comment 05/31/2017     Chemistry      Component Value Date/Time   NA 144 07/26/2017 0815   NA 143 02/22/2017 0744   K 4.1 07/26/2017 0815   K 4.0 02/22/2017 0744   CL 104 07/26/2017 0815   CL 106 02/22/2017 0744   CO2 29 07/26/2017 0815   CO2 27 02/22/2017 0744   BUN 24 (H) 07/26/2017 0815   BUN 26 (H) 02/22/2017 0744   CREATININE 2.40 (H) 07/26/2017 0815   CREATININE 2.3 (H) 02/22/2017 0744      Component Value Date/Time   CALCIUM 8.8 07/26/2017 0815   CALCIUM 9.3 02/22/2017 0744   ALKPHOS 74 07/26/2017 0815   ALKPHOS 80 02/22/2017 0744   AST 28 07/26/2017 0815   ALT 37 07/26/2017 0815   ALT 22 02/22/2017 0744   BILITOT 0.6 07/26/2017 0815      Impression and Plan: Mr. Schaum is a very pleasant 67 yo African American gentleman with IgG kappa myeloma. He is having problems again with arthritis despite taking his pain medication as prescribed.  We will have him try Ultram and see if this doesn't help with inflammation. He will let us know if this does not improve.  We will proceed with treatment today as planned.  We will plan to see him back in another 2 weeks for follow-up.  He will contact our office with any questions or concerns. We can certainly see him sooner if need be.   Volanda Napoleon, MD 6/3/20199:20 AM

## 2017-08-09 ENCOUNTER — Other Ambulatory Visit: Payer: Self-pay | Admitting: Family

## 2017-08-09 ENCOUNTER — Inpatient Hospital Stay: Payer: Medicare Other

## 2017-08-09 ENCOUNTER — Inpatient Hospital Stay (HOSPITAL_BASED_OUTPATIENT_CLINIC_OR_DEPARTMENT_OTHER): Payer: Medicare Other | Admitting: Hematology & Oncology

## 2017-08-09 ENCOUNTER — Other Ambulatory Visit: Payer: Self-pay

## 2017-08-09 VITALS — BP 159/71 | HR 74 | Temp 98.1°F | Resp 18 | Wt 191.0 lb

## 2017-08-09 DIAGNOSIS — C9000 Multiple myeloma not having achieved remission: Secondary | ICD-10-CM

## 2017-08-09 DIAGNOSIS — D509 Iron deficiency anemia, unspecified: Secondary | ICD-10-CM | POA: Diagnosis not present

## 2017-08-09 DIAGNOSIS — N189 Chronic kidney disease, unspecified: Secondary | ICD-10-CM

## 2017-08-09 DIAGNOSIS — Z299 Encounter for prophylactic measures, unspecified: Secondary | ICD-10-CM

## 2017-08-09 DIAGNOSIS — D631 Anemia in chronic kidney disease: Secondary | ICD-10-CM | POA: Diagnosis not present

## 2017-08-09 DIAGNOSIS — M4712 Other spondylosis with myelopathy, cervical region: Secondary | ICD-10-CM

## 2017-08-09 DIAGNOSIS — Z5112 Encounter for antineoplastic immunotherapy: Secondary | ICD-10-CM | POA: Diagnosis not present

## 2017-08-09 DIAGNOSIS — M5442 Lumbago with sciatica, left side: Secondary | ICD-10-CM

## 2017-08-09 DIAGNOSIS — D500 Iron deficiency anemia secondary to blood loss (chronic): Secondary | ICD-10-CM

## 2017-08-09 DIAGNOSIS — E349 Endocrine disorder, unspecified: Secondary | ICD-10-CM

## 2017-08-09 DIAGNOSIS — G8929 Other chronic pain: Secondary | ICD-10-CM

## 2017-08-09 DIAGNOSIS — M5441 Lumbago with sciatica, right side: Secondary | ICD-10-CM

## 2017-08-09 LAB — CBC WITH DIFFERENTIAL (CANCER CENTER ONLY)
Basophils Absolute: 0.1 10*3/uL (ref 0.0–0.1)
Basophils Relative: 1 %
EOS PCT: 4 %
Eosinophils Absolute: 0.3 10*3/uL (ref 0.0–0.5)
HCT: 31.2 % — ABNORMAL LOW (ref 38.7–49.9)
HEMOGLOBIN: 10.2 g/dL — AB (ref 13.0–17.1)
LYMPHS ABS: 1 10*3/uL (ref 0.9–3.3)
LYMPHS PCT: 15 %
MCH: 24.8 pg — AB (ref 28.0–33.4)
MCHC: 32.7 g/dL (ref 32.0–35.9)
MCV: 75.7 fL — AB (ref 82.0–98.0)
MONOS PCT: 11 %
Monocytes Absolute: 0.7 10*3/uL (ref 0.1–0.9)
NEUTROS PCT: 69 %
Neutro Abs: 4.6 10*3/uL (ref 1.5–6.5)
Platelet Count: 231 10*3/uL (ref 145–400)
RBC: 4.12 MIL/uL — AB (ref 4.20–5.70)
RDW: 19.4 % — ABNORMAL HIGH (ref 11.1–15.7)
WBC: 6.6 10*3/uL (ref 4.0–10.0)

## 2017-08-09 LAB — CMP (CANCER CENTER ONLY)
ALK PHOS: 63 U/L (ref 26–84)
ALT: 23 U/L (ref 10–47)
ANION GAP: 12 (ref 5–15)
AST: 20 U/L (ref 11–38)
Albumin: 3.7 g/dL (ref 3.5–5.0)
BUN: 21 mg/dL (ref 7–22)
CO2: 30 mmol/L (ref 18–33)
Calcium: 9 mg/dL (ref 8.0–10.3)
Chloride: 103 mmol/L (ref 98–108)
Creatinine: 2.2 mg/dL — ABNORMAL HIGH (ref 0.60–1.20)
Glucose, Bld: 122 mg/dL — ABNORMAL HIGH (ref 73–118)
POTASSIUM: 4.2 mmol/L (ref 3.3–4.7)
Sodium: 145 mmol/L (ref 128–145)
TOTAL PROTEIN: 7.2 g/dL (ref 6.4–8.1)
Total Bilirubin: 0.6 mg/dL (ref 0.2–1.6)

## 2017-08-09 LAB — IRON AND TIBC
IRON: 70 ug/dL (ref 42–163)
Saturation Ratios: 32 % — ABNORMAL LOW (ref 42–163)
TIBC: 219 ug/dL (ref 202–409)
UIBC: 149 ug/dL

## 2017-08-09 LAB — FERRITIN: FERRITIN: 2280 ng/mL — AB (ref 22–316)

## 2017-08-09 MED ORDER — DEXAMETHASONE SODIUM PHOSPHATE 10 MG/ML IJ SOLN
10.0000 mg | Freq: Once | INTRAMUSCULAR | Status: AC
  Administered 2017-08-09: 10 mg via INTRAVENOUS

## 2017-08-09 MED ORDER — SODIUM CHLORIDE 0.9% FLUSH
10.0000 mL | INTRAVENOUS | Status: DC | PRN
  Administered 2017-08-09: 10 mL
  Filled 2017-08-09: qty 10

## 2017-08-09 MED ORDER — PALONOSETRON HCL INJECTION 0.25 MG/5ML
INTRAVENOUS | Status: AC
Start: 2017-08-09 — End: ?
  Filled 2017-08-09: qty 5

## 2017-08-09 MED ORDER — SODIUM CHLORIDE 0.9 % IV SOLN
300.0000 mg/m2 | Freq: Once | INTRAVENOUS | Status: AC
  Administered 2017-08-09: 620 mg via INTRAVENOUS
  Filled 2017-08-09: qty 31

## 2017-08-09 MED ORDER — HYDROMORPHONE HCL 2 MG/ML IJ SOLN
INTRAMUSCULAR | Status: AC
  Filled 2017-08-09: qty 1

## 2017-08-09 MED ORDER — HYDROMORPHONE HCL 2 MG/ML IJ SOLN
2.0000 mg | Freq: Once | INTRAMUSCULAR | Status: AC
  Administered 2017-08-09: 2 mg via INTRAVENOUS

## 2017-08-09 MED ORDER — DEXAMETHASONE SODIUM PHOSPHATE 10 MG/ML IJ SOLN
INTRAMUSCULAR | Status: AC
  Filled 2017-08-09: qty 1

## 2017-08-09 MED ORDER — DARBEPOETIN ALFA 300 MCG/0.6ML IJ SOSY
PREFILLED_SYRINGE | INTRAMUSCULAR | Status: AC
  Filled 2017-08-09: qty 0.6

## 2017-08-09 MED ORDER — PAMIDRONATE DISODIUM 60 MG/10ML IV SOLN
60.0000 mg | Freq: Once | INTRAVENOUS | Status: AC
  Administered 2017-08-09: 60 mg via INTRAVENOUS
  Filled 2017-08-09: qty 10

## 2017-08-09 MED ORDER — HEPARIN SOD (PORK) LOCK FLUSH 100 UNIT/ML IV SOLN
500.0000 [IU] | Freq: Once | INTRAVENOUS | Status: AC | PRN
  Administered 2017-08-09: 500 [IU]
  Filled 2017-08-09: qty 5

## 2017-08-09 MED ORDER — DEXTROSE 5 % IV SOLN
29.0000 mg/m2 | Freq: Once | INTRAVENOUS | Status: AC
  Administered 2017-08-09: 60 mg via INTRAVENOUS
  Filled 2017-08-09: qty 30

## 2017-08-09 MED ORDER — DARBEPOETIN ALFA 300 MCG/0.6ML IJ SOSY
300.0000 ug | PREFILLED_SYRINGE | Freq: Once | INTRAMUSCULAR | Status: AC
  Administered 2017-08-09: 300 ug via SUBCUTANEOUS

## 2017-08-09 MED ORDER — PALONOSETRON HCL INJECTION 0.25 MG/5ML
0.2500 mg | Freq: Once | INTRAVENOUS | Status: AC
  Administered 2017-08-09: 0.25 mg via INTRAVENOUS

## 2017-08-09 MED ORDER — SODIUM CHLORIDE 0.9 % IV SOLN
Freq: Once | INTRAVENOUS | Status: AC
  Administered 2017-08-09: 09:00:00 via INTRAVENOUS

## 2017-08-09 NOTE — Patient Instructions (Signed)
[facility] Discharge Instructions for Patients Receiving Chemotherapy  Today you received the following chemotherapy agents Kyprolis, Cytoxan and Aredia.  To help prevent nausea and vomiting after your treatment, we encourage you to take your nausea medication as directed BUT NO ZOFRAN FOR 3 DAYS AFTER CHEMO.    If you develop nausea and vomiting that is not controlled by your nausea medication, call the clinic.   BELOW ARE SYMPTOMS THAT SHOULD BE REPORTED IMMEDIATELY:  *FEVER GREATER THAN 100.5 F  *CHILLS WITH OR WITHOUT FEVER  NAUSEA AND VOMITING THAT IS NOT CONTROLLED WITH YOUR NAUSEA MEDICATION  *UNUSUAL SHORTNESS OF BREATH  *UNUSUAL BRUISING OR BLEEDING  TENDERNESS IN MOUTH AND THROAT WITH OR WITHOUT PRESENCE OF ULCERS  *URINARY PROBLEMS  *BOWEL PROBLEMS  UNUSUAL RASH Items with * indicate a potential emergency and should be followed up as soon as possible.  Feel free to call the clinic you have any questions or concerns. The clinic phone number is [phone].  Please show the CHEMO ALERT CARD at check-in to the Emergency Department and triage nurse.    

## 2017-08-09 NOTE — Progress Notes (Signed)
Hematology and Oncology Follow Up Visit  [name] [id number] [birth year] 67 y.o. 08/09/2017   Principle Diagnosis:  IgG kappa myeloma Anemia secondary to renal insufficiency Intermittent iron - deficiency anemia Hypotestosteronemia  Current Therapy:   Aredia 60 mg IV q 2 months  Aranesp 300 mcg subcu as needed for hemoglobin less than 11 Depo-Testosterone 300 mg q 4 weeks IV iron as indicated Kyprolis/Cytoxan - s/p cycle#6   Interim History:  [name] is here today for follow-up and treatment.  He is doing okay.  He is having his aches and pains from the fibromyalgia.  His myeloma has been holding nice and stable.  We last saw him back in May, his M spike was 0.4 g/dL.  His IgG level was 1220 mg/dL.  His Kappa Lightchain was 2.2 mg/dL.  Essentially, this is holding steady.  His iron studies that we checked back in June showed a ferritin of 2300 and iron saturation of 70%.  His appetite is okay.  Has had no problems with nausea or vomiting.  He has had no change in bowel or bladder habits.  His blood sugars have been doing a little bit better.  Overall, his performance status is ECOG 1.  Medications:  Allergies as of 08/09/2017      Reactions   Iodinated Diagnostic Agents Rash   Patient states he was instructed not to take IV contrast.  In 2008 he had an unknown reaction, and was told not to take it again.  He was also told not to take it due to his kidneys.   Iodine Anxiety, Rash, Other (See Comments)   Didn't feel right "instructed not to take per MD--something with his port" Didn't feel right Didn't feel right "instructed not to take per MD--something with his port"      Medication List        Accurate as of 08/09/17  8:20 AM. Always use your most recent med list.          amLODipine 10 MG tablet Commonly known as:  NORVASC Take 10 mg by mouth daily.   CARAFATE 1 GM/10ML suspension Generic drug:  sucralfate Take 1 g by mouth 4 (four) times daily.     cefdinir 300 MG capsule Commonly known as:  OMNICEF Take 1 capsule (300 mg total) by mouth 2 (two) times daily.   CLEAR EYES ALL SEASONS 5-6 MG/ML Soln Generic drug:  Polyvinyl Alcohol-Povidone Place 2 drops into both eyes 3 (three) times daily as needed (drye eyes.).   dicyclomine 10 MG capsule Commonly known as:  BENTYL Take 1 capsule (10 mg total) by mouth 3 (three) times daily.   EMBEDA 20-0.8 MG Cpcr Generic drug:  Morphine-Naltrexone Take 1 capsule by mouth 3 (three) times daily.   EMBEDA 30-1.2 MG Cpcr Generic drug:  Morphine-Naltrexone Take 3 (three) times daily by mouth.   furosemide 40 MG tablet Commonly known as:  LASIX Take 40 mg by mouth.   glipiZIDE 5 MG tablet Commonly known as:  GLUCOTROL Take 5 mg by mouth 2 (two) times daily before a meal.   isosorbide mononitrate 30 MG 24 hr tablet Commonly known as:  IMDUR Take 30 mg by mouth daily.   latanoprost 0.005 % ophthalmic solution Commonly known as:  XALATAN Place 1 drop into the right eye every evening.   lidocaine-prilocaine cream Commonly known as:  EMLA Apply 1 application topically as needed.   linaclotide 145 MCG Caps capsule Commonly known as:  LINZESS Take 1 capsule (145 mcg  total) by mouth daily before breakfast.   LORazepam 0.5 MG tablet Commonly known as:  ATIVAN Take 1 tablet (0.5 mg total) by mouth every 6 (six) hours as needed (Nausea or vomiting).   losartan 25 MG tablet Commonly known as:  COZAAR Take 25 mg by mouth.   meclizine 50 MG tablet Commonly known as:  ANTIVERT Take 0.5 tablets (25 mg total) by mouth 4 (four) times daily as needed for dizziness.   methocarbamol 500 MG tablet Commonly known as:  ROBAXIN Take 500 mg by mouth 4 (four) times daily.   mirabegron ER 50 MG Tb24 tablet Commonly known as:  MYRBETRIQ Take 50 mg by mouth daily.   omeprazole 40 MG capsule Commonly known as:  PRILOSEC take 1 capsule by mouth twice a day   ondansetron 4 MG disintegrating  tablet Commonly known as:  ZOFRAN ODT Take 1 tablet (4 mg total) by mouth every 8 (eight) hours as needed for nausea or vomiting.   ondansetron 8 MG tablet Commonly known as:  ZOFRAN Take 1 tablet (8 mg total) by mouth 2 (two) times daily as needed (Nausea or vomiting).   orphenadrine 100 MG tablet Commonly known as:  NORFLEX Take 1 tablet (100 mg total) by mouth 2 (two) times daily.   oxyCODONE 20 mg 12 hr tablet Commonly known as:  OXYCONTIN Take 1 tablet (20 mg total) by mouth every 12 (twelve) hours.   oxyCODONE-acetaminophen 10-325 MG tablet Commonly known as:  PERCOCET Take 1 tablet by mouth every 6 (six) hours as needed for pain.   potassium chloride SA 20 MEQ tablet Commonly known as:  K-DUR,KLOR-CON Take 1 tablet (20 mEq total) by mouth 2 (two) times daily.   prochlorperazine 10 MG tablet Commonly known as:  COMPAZINE take 1 tablet by mouth every 6 hours if needed for nausea and vomiting   TOPROL XL 50 MG 24 hr tablet Generic drug:  metoprolol succinate Take 50 mg by mouth daily.   traMADol 50 MG tablet Commonly known as:  ULTRAM Take 1 tablet (50 mg total) by mouth every 6 (six) hours as needed.   Vitamin D (Ergocalciferol) 50000 units Caps capsule Commonly known as:  DRISDOL Take 50,000 Units by mouth every Saturday.   ZIOPTAN 0.0015 % Soln Generic drug:  Tafluprost Place 1 drop into both eyes daily.       Allergies:  Allergies  Allergen Reactions  . Iodinated Diagnostic Agents Rash    Patient states he was instructed not to take IV contrast.  In 2008 he had an unknown reaction, and was told not to take it again.  He was also told not to take it due to his kidneys.  . Iodine Anxiety, Rash and Other (See Comments)    Didn't feel right "instructed not to take per MD--something with his port" Didn't feel right Didn't feel right "instructed not to take per MD--something with his port"    Past Medical History, Surgical history, Social history, and  Family History were reviewed and updated.  Review of Systems: Review of Systems  Constitutional: Negative.   HENT: Negative.   Eyes: Negative.   Respiratory: Negative.   Cardiovascular: Negative.   Gastrointestinal: Negative.   Genitourinary: Negative.   Musculoskeletal: Positive for joint pain and myalgias.  Skin: Negative.   Neurological: Negative.   Endo/Heme/Allergies: Negative.   Psychiatric/Behavioral: Negative.      Physical Exam:  weight is 191 lb (86.6 kg). His oral temperature is 98.1 F (36.7 C). His blood pressure  is 159/71 (abnormal) and his pulse is 74. His respiration is 18 and oxygen saturation is 100%.   Wt Readings from Last 3 Encounters:  08/09/17 191 lb (86.6 kg)  07/26/17 185 lb 1.3 oz (84 kg)  07/20/17 188 lb (85.3 kg)    Physical Exam  Constitutional: He is oriented to person, place, and time.  HENT:  Head: Normocephalic and atraumatic.  Mouth/Throat: Oropharynx is clear and moist.  Eyes: Pupils are equal, round, and reactive to light. EOM are normal.  Neck: Normal range of motion.  Cardiovascular: Normal rate, regular rhythm and normal heart sounds.  Pulmonary/Chest: Effort normal and breath sounds normal.  Abdominal: Soft. Bowel sounds are normal.  Musculoskeletal: Normal range of motion. He exhibits no edema, tenderness or deformity.  Lymphadenopathy:    He has no cervical adenopathy.  Neurological: He is alert and oriented to person, place, and time.  Skin: Skin is warm and dry. No rash noted. No erythema.  Psychiatric: He has a normal mood and affect. His behavior is normal. Judgment and thought content normal.  Vitals reviewed.    Lab Results  Component Value Date   WBC 6.6 08/09/2017   HGB 10.2 (L) 08/09/2017   HCT 31.2 (L) 08/09/2017   MCV 75.7 (L) 08/09/2017   PLT 231 08/09/2017   Lab Results  Component Value Date   FERRITIN 2,313 (H) 07/26/2017   IRON 135 07/26/2017   TIBC 194 (L) 07/26/2017   UIBC 58 07/26/2017    IRONPCTSAT 70 07/26/2017   Lab Results  Component Value Date   RETICCTPCT 0.6 03/21/2014   RBC 4.12 (L) 08/09/2017   RETICCTABS 27.5 03/21/2014   Lab Results  Component Value Date   KPAFRELGTCHN 38.7 (H) 07/12/2017   LAMBDASER 21.5 07/12/2017   KAPLAMBRATIO 1.80 (H) 07/12/2017   Lab Results  Component Value Date   IGGSERUM 1,219 07/12/2017   IGGSERUM 1,201 07/12/2017   IGA 194 07/12/2017   IGA 192 07/12/2017   IGMSERUM 30 07/12/2017   IGMSERUM 27 07/12/2017   Lab Results  Component Value Date   TOTALPROTELP 7.1 07/12/2017   ALBUMINELP 3.8 07/12/2017   A1GS 0.2 07/12/2017   A2GS 1.0 07/12/2017   BETS 1.0 07/12/2017   BETA2SER 0.5 11/07/2014   GAMS 1.1 07/12/2017   MSPIKE 0.4 (H) 07/12/2017   SPEI Comment 05/31/2017     Chemistry      Component Value Date/Time   NA 144 07/26/2017 0815   NA 143 02/22/2017 0744   K 4.1 07/26/2017 0815   K 4.0 02/22/2017 0744   CL 104 07/26/2017 0815   CL 106 02/22/2017 0744   CO2 29 07/26/2017 0815   CO2 27 02/22/2017 0744   BUN 24 (H) 07/26/2017 0815   BUN 26 (H) 02/22/2017 0744   CREATININE 2.40 (H) 07/26/2017 0815   CREATININE 2.3 (H) 02/22/2017 0744      Component Value Date/Time   CALCIUM 8.8 07/26/2017 0815   CALCIUM 9.3 02/22/2017 0744   ALKPHOS 74 07/26/2017 0815   ALKPHOS 80 02/22/2017 0744   AST 28 07/26/2017 0815   ALT 37 07/26/2017 0815   ALT 22 02/22/2017 0744   BILITOT 0.6 07/26/2017 0815      Impression and Plan: Mr. Burch is a very pleasant 67 yo African American gentleman with IgG kappa myeloma.  So far, everything is doing pretty stable for him.  I know that he has couple other health issues.  We will continue to treat as long as his M spike  is holding steady.  I think if we start to see any progression of the M spike, we might want to consider monoclonal antibody therapy.  We will plan to get him back to see [place] in another 4 weeks.  Volanda Napoleon, MD 6/17/20198:20 AM

## 2017-08-09 NOTE — Patient Instructions (Signed)
Implanted Port Home Guide An implanted port is a type of central line that is placed under the skin. Central lines are used to provide IV access when treatment or nutrition needs to be given through a person's veins. Implanted ports are used for long-term IV access. An implanted port may be placed because:  You need IV medicine that would be irritating to the small veins in your hands or arms.  You need long-term IV medicines, such as antibiotics.  You need IV nutrition for a long period.  You need frequent blood draws for lab tests.  You need dialysis.  Implanted ports are usually placed in the chest area, but they can also be placed in the upper arm, the abdomen, or the leg. An implanted port has two main parts:  Reservoir. The reservoir is round and will appear as a small, raised area under your skin. The reservoir is the part where a needle is inserted to give medicines or draw blood.  Catheter. The catheter is a thin, flexible tube that extends from the reservoir. The catheter is placed into a large vein. Medicine that is inserted into the reservoir goes into the catheter and then into the vein.  How will I care for my incision site? Do not get the incision site wet. Bathe or shower as directed by your health care provider. How is my port accessed? Special steps must be taken to access the port:  Before the port is accessed, a numbing cream can be placed on the skin. This helps numb the skin over the port site.  Your health care provider uses a sterile technique to access the port. ? Your health care provider must put on a mask and sterile gloves. ? The skin over your port is cleaned carefully with an antiseptic and allowed to dry. ? The port is gently pinched between sterile gloves, and a needle is inserted into the port.  Only "non-coring" port needles should be used to access the port. Once the port is accessed, a blood return should be checked. This helps ensure that the port  is in the vein and is not clogged.  If your port needs to remain accessed for a constant infusion, a clear (transparent) bandage will be placed over the needle site. The bandage and needle will need to be changed every week, or as directed by your health care provider.  Keep the bandage covering the needle clean and dry. Do not get it wet. Follow your health care provider's instructions on how to take a shower or bath while the port is accessed.  If your port does not need to stay accessed, no bandage is needed over the port.  What is flushing? Flushing helps keep the port from getting clogged. Follow your health care provider's instructions on how and when to flush the port. Ports are usually flushed with saline solution or a medicine called heparin. The need for flushing will depend on how the port is used.  If the port is used for intermittent medicines or blood draws, the port will need to be flushed: ? After medicines have been given. ? After blood has been drawn. ? As part of routine maintenance.  If a constant infusion is running, the port may not need to be flushed.  How long will my port stay implanted? The port can stay in for as long as your health care provider thinks it is needed. When it is time for the port to come out, surgery will be   done to remove it. The procedure is similar to the one performed when the port was put in. When should I seek immediate medical care? When you have an implanted port, you should seek immediate medical care if:  You notice a bad smell coming from the incision site.  You have swelling, redness, or drainage at the incision site.  You have more swelling or pain at the port site or the surrounding area.  You have a fever that is not controlled with medicine.  This information is not intended to replace advice given to you by your health care provider. Make sure you discuss any questions you have with your health care provider. Document  Released: 02/09/2005 Document Revised: 07/18/2015 Document Reviewed: 10/17/2012 Elsevier Interactive Patient Education  2017 [employer]  

## 2017-08-10 LAB — KAPPA/LAMBDA LIGHT CHAINS
KAPPA FREE LGHT CHN: 27.5 mg/L — AB (ref 3.3–19.4)
Kappa, lambda light chain ratio: 1.58 (ref 0.26–1.65)
LAMDA FREE LIGHT CHAINS: 17.4 mg/L (ref 5.7–26.3)

## 2017-08-10 LAB — IGG, IGA, IGM
IgA: 171 mg/dL (ref 61–437)
IgG (Immunoglobin G), Serum: 1013 mg/dL (ref 700–1600)
IgM (Immunoglobulin M), Srm: 25 mg/dL (ref 20–172)

## 2017-08-10 LAB — TESTOSTERONE: TESTOSTERONE: 536 ng/dL (ref 264–916)

## 2017-08-12 ENCOUNTER — Ambulatory Visit (HOSPITAL_COMMUNITY)
Admission: RE | Admit: 2017-08-12 | Discharge: 2017-08-12 | Disposition: A | Discharge status: Home / Self Care | Payer: Medicare Other | Source: Ambulatory Visit | Attending: Family | Admitting: Family

## 2017-08-12 DIAGNOSIS — G8929 Other chronic pain: Secondary | ICD-10-CM | POA: Insufficient documentation

## 2017-08-12 DIAGNOSIS — M5442 Lumbago with sciatica, left side: Secondary | ICD-10-CM | POA: Diagnosis not present

## 2017-08-12 DIAGNOSIS — M5441 Lumbago with sciatica, right side: Secondary | ICD-10-CM | POA: Diagnosis not present

## 2017-08-12 DIAGNOSIS — N289 Disorder of kidney and ureter, unspecified: Secondary | ICD-10-CM | POA: Diagnosis not present

## 2017-08-12 DIAGNOSIS — C9000 Multiple myeloma not having achieved remission: Secondary | ICD-10-CM | POA: Insufficient documentation

## 2017-08-12 DIAGNOSIS — D649 Anemia, unspecified: Secondary | ICD-10-CM | POA: Diagnosis not present

## 2017-08-12 DIAGNOSIS — M47816 Spondylosis without myelopathy or radiculopathy, lumbar region: Secondary | ICD-10-CM | POA: Diagnosis not present

## 2017-08-13 ENCOUNTER — Other Ambulatory Visit: Payer: Self-pay | Admitting: *Deleted

## 2017-08-13 DIAGNOSIS — C9000 Multiple myeloma not having achieved remission: Secondary | ICD-10-CM

## 2017-08-13 LAB — PROTEIN ELECTROPHORESIS, SERUM, WITH REFLEX
A/G Ratio: 1.3 (ref 0.7–1.7)
ALBUMIN ELP: 3.7 g/dL (ref 2.9–4.4)
ALPHA-1-GLOBULIN: 0.2 g/dL (ref 0.0–0.4)
ALPHA-2-GLOBULIN: 1 g/dL (ref 0.4–1.0)
Beta Globulin: 0.8 g/dL (ref 0.7–1.3)
Gamma Globulin: 0.9 g/dL (ref 0.4–1.8)
Globulin, Total: 2.9 g/dL (ref 2.2–3.9)
M-SPIKE, %: 0.3 g/dL — AB
SPEP INTERP: 0
Total Protein ELP: 6.6 g/dL (ref 6.0–8.5)

## 2017-08-13 LAB — IMMUNOFIXATION REFLEX, SERUM
IgA: 181 mg/dL (ref 61–437)
IgG (Immunoglobin G), Serum: 1087 mg/dL (ref 700–1600)
IgM (Immunoglobulin M), Srm: 25 mg/dL (ref 20–172)

## 2017-08-16 ENCOUNTER — Other Ambulatory Visit: Payer: Self-pay | Admitting: *Deleted

## 2017-08-16 ENCOUNTER — Inpatient Hospital Stay: Payer: Medicare Other

## 2017-08-16 VITALS — BP 124/64 | HR 66 | Temp 98.6°F | Resp 18

## 2017-08-16 DIAGNOSIS — Z299 Encounter for prophylactic measures, unspecified: Secondary | ICD-10-CM

## 2017-08-16 DIAGNOSIS — C9000 Multiple myeloma not having achieved remission: Secondary | ICD-10-CM

## 2017-08-16 DIAGNOSIS — N189 Chronic kidney disease, unspecified: Principal | ICD-10-CM

## 2017-08-16 DIAGNOSIS — M2550 Pain in unspecified joint: Secondary | ICD-10-CM

## 2017-08-16 DIAGNOSIS — Z5112 Encounter for antineoplastic immunotherapy: Secondary | ICD-10-CM | POA: Diagnosis not present

## 2017-08-16 DIAGNOSIS — D631 Anemia in chronic kidney disease: Secondary | ICD-10-CM

## 2017-08-16 DIAGNOSIS — E349 Endocrine disorder, unspecified: Secondary | ICD-10-CM

## 2017-08-16 DIAGNOSIS — R52 Pain, unspecified: Secondary | ICD-10-CM

## 2017-08-16 LAB — CMP (CANCER CENTER ONLY)
ALBUMIN: 3.5 g/dL (ref 3.5–5.0)
ALK PHOS: 84 U/L (ref 26–84)
ALT: 20 U/L (ref 10–47)
ANION GAP: 14 (ref 5–15)
AST: 21 U/L (ref 11–38)
BILIRUBIN TOTAL: 0.5 mg/dL (ref 0.2–1.6)
BUN: 24 mg/dL — ABNORMAL HIGH (ref 7–22)
CALCIUM: 9 mg/dL (ref 8.0–10.3)
CO2: 29 mmol/L (ref 18–33)
Chloride: 104 mmol/L (ref 98–108)
Creatinine: 2.5 mg/dL — ABNORMAL HIGH (ref 0.60–1.20)
Glucose, Bld: 83 mg/dL (ref 73–118)
Potassium: 3.8 mmol/L (ref 3.3–4.7)
Sodium: 147 mmol/L — ABNORMAL HIGH (ref 128–145)
TOTAL PROTEIN: 7.1 g/dL (ref 6.4–8.1)

## 2017-08-16 LAB — CBC WITH DIFFERENTIAL (CANCER CENTER ONLY)
BASOS ABS: 0 10*3/uL (ref 0.0–0.1)
Basophils Relative: 0 %
Eosinophils Absolute: 0.2 10*3/uL (ref 0.0–0.5)
Eosinophils Relative: 4 %
HEMATOCRIT: 32.5 % — AB (ref 38.7–49.9)
HEMOGLOBIN: 10.4 g/dL — AB (ref 13.0–17.1)
LYMPHS PCT: 16 %
Lymphs Abs: 0.8 10*3/uL — ABNORMAL LOW (ref 0.9–3.3)
MCH: 25.1 pg — ABNORMAL LOW (ref 28.0–33.4)
MCHC: 32 g/dL (ref 32.0–35.9)
MCV: 78.3 fL — AB (ref 82.0–98.0)
MONO ABS: 1.1 10*3/uL — AB (ref 0.1–0.9)
Monocytes Relative: 22 %
NEUTROS ABS: 3 10*3/uL (ref 1.5–6.5)
NEUTROS PCT: 58 %
Platelet Count: 200 10*3/uL (ref 145–400)
RBC: 4.15 MIL/uL — AB (ref 4.20–5.70)
RDW: 21.6 % — ABNORMAL HIGH (ref 11.1–15.7)
WBC Count: 5.2 10*3/uL (ref 4.0–10.0)

## 2017-08-16 MED ORDER — CARFILZOMIB CHEMO INJECTION 60 MG
60.0000 mg | Freq: Once | INTRAVENOUS | Status: AC
Start: 2017-08-16 — End: 2017-08-16
  Administered 2017-08-16: 60 mg via INTRAVENOUS
  Filled 2017-08-16: qty 30

## 2017-08-16 MED ORDER — SODIUM CHLORIDE 0.9 % IV SOLN
Freq: Once | INTRAVENOUS | Status: AC
  Administered 2017-08-16: 09:00:00 via INTRAVENOUS

## 2017-08-16 MED ORDER — SODIUM CHLORIDE 0.9 % IV SOLN
300.0000 mg/m2 | Freq: Once | INTRAVENOUS | Status: AC
  Administered 2017-08-16: 620 mg via INTRAVENOUS
  Filled 2017-08-16: qty 31

## 2017-08-16 MED ORDER — TRAMADOL HCL 50 MG PO TABS: 50.0000 mg | ORAL_TABLET | Freq: Four times a day (QID) | ORAL | 1 refills | Status: DC | PRN

## 2017-08-16 MED ORDER — DEXAMETHASONE SODIUM PHOSPHATE 10 MG/ML IJ SOLN
INTRAMUSCULAR | Status: AC
  Filled 2017-08-16: qty 1

## 2017-08-16 MED ORDER — SODIUM CHLORIDE 0.9% FLUSH
10.0000 mL | INTRAVENOUS | Status: DC | PRN
  Administered 2017-08-16: 10 mL
  Filled 2017-08-16: qty 10

## 2017-08-16 MED ORDER — LORAZEPAM 0.5 MG PO TABS: 0.5000 mg | ORAL_TABLET | Freq: Four times a day (QID) | ORAL | 2 refills | Status: DC | PRN

## 2017-08-16 MED ORDER — ONDANSETRON HCL 8 MG PO TABS: 8.0000 mg | ORAL_TABLET | Freq: Two times a day (BID) | ORAL | 1 refills | Status: DC | PRN

## 2017-08-16 MED ORDER — PALONOSETRON HCL INJECTION 0.25 MG/5ML
0.2500 mg | Freq: Once | INTRAVENOUS | Status: AC
  Administered 2017-08-16: 0.25 mg via INTRAVENOUS

## 2017-08-16 MED ORDER — PALONOSETRON HCL INJECTION 0.25 MG/5ML
INTRAVENOUS | Status: AC
  Filled 2017-08-16: qty 5

## 2017-08-16 MED ORDER — HYDROMORPHONE HCL 2 MG/ML IJ SOLN
2.0000 mg | Freq: Once | INTRAMUSCULAR | Status: AC
  Administered 2017-08-16: 2 mg via INTRAVENOUS

## 2017-08-16 MED ORDER — DEXAMETHASONE SODIUM PHOSPHATE 10 MG/ML IJ SOLN
10.0000 mg | Freq: Once | INTRAMUSCULAR | Status: AC
  Administered 2017-08-16: 10 mg via INTRAVENOUS

## 2017-08-16 MED ORDER — HYDROMORPHONE HCL 2 MG/ML IJ SOLN
INTRAMUSCULAR | Status: AC
  Filled 2017-08-16: qty 1

## 2017-08-16 MED ORDER — HEPARIN SOD (PORK) LOCK FLUSH 100 UNIT/ML IV SOLN
500.0000 [IU] | Freq: Once | INTRAVENOUS | Status: AC | PRN
  Administered 2017-08-16: 500 [IU]
  Filled 2017-08-16: qty 5

## 2017-08-16 NOTE — Progress Notes (Signed)
OK to treat with creatinine of 2.5 per Dr. [name].

## 2017-08-16 NOTE — Patient Instructions (Signed)
[name] Discharge Instructions for Patients Receiving Chemotherapy  Today you received the following chemotherapy agents:  Cytoxan and Kyprolis  To help prevent nausea and vomiting after your treatment, we encourage you to take your nausea medication as ordered per MD.    If you develop nausea and vomiting that is not controlled by your nausea medication, call the clinic.   BELOW ARE SYMPTOMS THAT SHOULD BE REPORTED IMMEDIATELY:  *FEVER GREATER THAN 100.5 F  *CHILLS WITH OR WITHOUT FEVER  NAUSEA AND VOMITING THAT IS NOT CONTROLLED WITH YOUR NAUSEA MEDICATION  *UNUSUAL SHORTNESS OF BREATH  *UNUSUAL BRUISING OR BLEEDING  TENDERNESS IN MOUTH AND THROAT WITH OR WITHOUT PRESENCE OF ULCERS  *URINARY PROBLEMS  *BOWEL PROBLEMS  UNUSUAL RASH Items with * indicate a potential emergency and should be followed up as soon as possible.  Feel free to call the clinic should you have any questions or concerns. The clinic phone number is [phone].  Please show the [place] at check-in to the Emergency Department and triage nurse.

## 2017-08-20 ENCOUNTER — Other Ambulatory Visit: Payer: Self-pay | Admitting: Hematology & Oncology

## 2017-08-20 ENCOUNTER — Other Ambulatory Visit: Payer: Self-pay

## 2017-08-20 DIAGNOSIS — C9000 Multiple myeloma not having achieved remission: Secondary | ICD-10-CM

## 2017-08-23 ENCOUNTER — Inpatient Hospital Stay: Payer: Medicare Other

## 2017-08-23 ENCOUNTER — Other Ambulatory Visit: Payer: Self-pay

## 2017-08-23 ENCOUNTER — Inpatient Hospital Stay: Payer: Medicare Other | Attending: Hematology & Oncology

## 2017-08-23 ENCOUNTER — Ambulatory Visit: Payer: Medicare Other | Admitting: Family

## 2017-08-23 DIAGNOSIS — Z5112 Encounter for antineoplastic immunotherapy: Secondary | ICD-10-CM | POA: Diagnosis present

## 2017-08-23 DIAGNOSIS — D631 Anemia in chronic kidney disease: Secondary | ICD-10-CM | POA: Insufficient documentation

## 2017-08-23 DIAGNOSIS — Z5111 Encounter for antineoplastic chemotherapy: Secondary | ICD-10-CM | POA: Insufficient documentation

## 2017-08-23 DIAGNOSIS — N189 Chronic kidney disease, unspecified: Principal | ICD-10-CM

## 2017-08-23 DIAGNOSIS — C9000 Multiple myeloma not having achieved remission: Secondary | ICD-10-CM

## 2017-08-23 DIAGNOSIS — Z299 Encounter for prophylactic measures, unspecified: Secondary | ICD-10-CM

## 2017-08-23 DIAGNOSIS — E291 Testicular hypofunction: Secondary | ICD-10-CM | POA: Insufficient documentation

## 2017-08-23 DIAGNOSIS — M25561 Pain in right knee: Secondary | ICD-10-CM | POA: Diagnosis not present

## 2017-08-23 DIAGNOSIS — M25562 Pain in left knee: Secondary | ICD-10-CM | POA: Insufficient documentation

## 2017-08-23 DIAGNOSIS — M4712 Other spondylosis with myelopathy, cervical region: Secondary | ICD-10-CM

## 2017-08-23 DIAGNOSIS — E349 Endocrine disorder, unspecified: Secondary | ICD-10-CM

## 2017-08-23 LAB — CBC WITH DIFFERENTIAL (CANCER CENTER ONLY)
BASOS PCT: 0 %
Basophils Absolute: 0 10*3/uL (ref 0.0–0.1)
Eosinophils Absolute: 0.2 10*3/uL (ref 0.0–0.5)
Eosinophils Relative: 3 %
HEMATOCRIT: 32.4 % — AB (ref 38.7–49.9)
Hemoglobin: 10.6 g/dL — ABNORMAL LOW (ref 13.0–17.1)
Lymphocytes Relative: 12 %
Lymphs Abs: 0.7 10*3/uL — ABNORMAL LOW (ref 0.9–3.3)
MCH: 25.6 pg — ABNORMAL LOW (ref 28.0–33.4)
MCHC: 32.7 g/dL (ref 32.0–35.9)
MCV: 78.3 fL — ABNORMAL LOW (ref 82.0–98.0)
Monocytes Absolute: 1 10*3/uL — ABNORMAL HIGH (ref 0.1–0.9)
Monocytes Relative: 17 %
NEUTROS ABS: 4.1 10*3/uL (ref 1.5–6.5)
NEUTROS PCT: 68 %
Platelet Count: 149 10*3/uL (ref 145–400)
RBC: 4.14 MIL/uL — AB (ref 4.20–5.70)
RDW: 21.5 % — ABNORMAL HIGH (ref 11.1–15.7)
WBC Count: 6 10*3/uL (ref 4.0–10.0)

## 2017-08-23 LAB — CMP (CANCER CENTER ONLY)
ALBUMIN: 3.4 g/dL — AB (ref 3.5–5.0)
ALK PHOS: 74 U/L (ref 26–84)
ALT: 24 U/L (ref 10–47)
AST: 20 U/L (ref 11–38)
Anion gap: 11 (ref 5–15)
BILIRUBIN TOTAL: 0.7 mg/dL (ref 0.2–1.6)
BUN: 28 mg/dL — ABNORMAL HIGH (ref 7–22)
CALCIUM: 8.6 mg/dL (ref 8.0–10.3)
CO2: 30 mmol/L (ref 18–33)
CREATININE: 2 mg/dL — AB (ref 0.60–1.20)
Chloride: 101 mmol/L (ref 98–108)
Glucose, Bld: 122 mg/dL — ABNORMAL HIGH (ref 73–118)
Potassium: 3.8 mmol/L (ref 3.3–4.7)
Sodium: 142 mmol/L (ref 128–145)
Total Protein: 6.5 g/dL (ref 6.4–8.1)

## 2017-08-23 MED ORDER — DEXTROSE 5 % IV SOLN
29.0000 mg/m2 | Freq: Once | INTRAVENOUS | Status: AC
  Administered 2017-08-23: 60 mg via INTRAVENOUS
  Filled 2017-08-23: qty 30

## 2017-08-23 MED ORDER — DEXAMETHASONE SODIUM PHOSPHATE 10 MG/ML IJ SOLN
INTRAMUSCULAR | Status: AC
  Filled 2017-08-23: qty 1

## 2017-08-23 MED ORDER — SODIUM CHLORIDE 0.9 % IV SOLN
300.0000 mg/m2 | Freq: Once | INTRAVENOUS | Status: AC
  Administered 2017-08-23: 620 mg via INTRAVENOUS
  Filled 2017-08-23: qty 31

## 2017-08-23 MED ORDER — SODIUM CHLORIDE 0.9 % IV SOLN
Freq: Once | INTRAVENOUS | Status: AC
  Administered 2017-08-23: 09:00:00 via INTRAVENOUS

## 2017-08-23 MED ORDER — DEXAMETHASONE SODIUM PHOSPHATE 10 MG/ML IJ SOLN
10.0000 mg | Freq: Once | INTRAMUSCULAR | Status: AC
  Administered 2017-08-23: 10 mg via INTRAVENOUS

## 2017-08-23 MED ORDER — SODIUM CHLORIDE 0.9% FLUSH
10.0000 mL | INTRAVENOUS | Status: DC | PRN
  Administered 2017-08-23: 10 mL
  Filled 2017-08-23: qty 10

## 2017-08-23 MED ORDER — PALONOSETRON HCL INJECTION 0.25 MG/5ML
0.2500 mg | Freq: Once | INTRAVENOUS | Status: AC
  Administered 2017-08-23: 0.25 mg via INTRAVENOUS

## 2017-08-23 MED ORDER — LORAZEPAM 0.5 MG PO TABS
0.5000 mg | ORAL_TABLET | Freq: Four times a day (QID) | ORAL | 2 refills | Status: DC | PRN
Start: 2017-08-23 — End: 2019-05-01

## 2017-08-23 MED ORDER — HYDROMORPHONE HCL 2 MG/ML IJ SOLN
INTRAMUSCULAR | Status: AC
  Filled 2017-08-23: qty 1

## 2017-08-23 MED ORDER — PALONOSETRON HCL INJECTION 0.25 MG/5ML
INTRAVENOUS | Status: AC
  Filled 2017-08-23: qty 5

## 2017-08-23 MED ORDER — SODIUM CHLORIDE 0.9 % IV SOLN: Freq: Once | INTRAVENOUS | Status: AC

## 2017-08-23 MED ORDER — HYDROMORPHONE HCL 2 MG/ML IJ SOLN
2.0000 mg | Freq: Once | INTRAMUSCULAR | Status: AC
Start: 2017-08-23 — End: 2017-08-23
  Administered 2017-08-23: 2 mg via INTRAVENOUS

## 2017-08-23 MED ORDER — HEPARIN SOD (PORK) LOCK FLUSH 100 UNIT/ML IV SOLN
500.0000 [IU] | Freq: Once | INTRAVENOUS | Status: AC | PRN
  Administered 2017-08-23: 500 [IU]
  Filled 2017-08-23: qty 5

## 2017-08-23 NOTE — Patient Instructions (Signed)
Carfilzomib injection What is this medicine? CARFILZOMIB (kar FILZ oh mib) targets a specific protein within cancer cells and stops the cancer cells from growing. It is used to treat multiple myeloma. This medicine may be used for other purposes; ask your health care provider or pharmacist if you have questions. COMMON BRAND NAME(S): KYPROLIS What should I tell my health care provider before I take this medicine? They need to know if you have any of these conditions: -heart disease -history of blood clots -irregular heartbeat -kidney disease -liver disease -lung or breathing disease -an unusual or allergic reaction to carfilzomib, or other medicines, foods, dyes, or preservatives -pregnant or trying to get pregnant -breast-feeding How should I use this medicine? This medicine is for injection or infusion into a vein. It is given by a health care professional in a hospital or clinic setting. Talk to your pediatrician regarding the use of this medicine in children. Special care may be needed. Overdosage: If you think you have taken too much of this medicine contact a poison control center or emergency room at once. NOTE: This medicine is only for you. Do not share this medicine with others. What if I miss a dose? It is important not to miss your dose. Call your doctor or health care professional if you are unable to keep an appointment. What may interact with this medicine? Interactions are not expected. Give your health care provider a list of all the medicines, herbs, non-prescription drugs, or dietary supplements you use. Also tell them if you smoke, drink alcohol, or use illegal drugs. Some items may interact with your medicine. This list may not describe all possible interactions. Give your health care provider a list of all the medicines, herbs, non-prescription drugs, or dietary supplements you use. Also tell them if you smoke, drink alcohol, or use illegal drugs. Some items may  interact with your medicine. What should I watch for while using this medicine? Your condition will be monitored carefully while you are receiving this medicine. Report any side effects. Continue your course of treatment even though you feel ill unless your doctor tells you to stop. You may need blood work done while you are taking this medicine. Do not become pregnant while taking this medicine or for at least 30 days after stopping it. Women should inform their doctor if they wish to become pregnant or think they might be pregnant. There is a potential for serious side effects to an unborn child. Men should not father a child while taking this medicine and for 90 days after stopping it. Talk to your health care professional or pharmacist for more information. Do not breast-feed an infant while taking this medicine. Check with your doctor or health care professional if you get an attack of severe diarrhea, nausea and vomiting, or if you sweat a lot. The loss of too much body fluid can make it dangerous for you to take this medicine. You may get dizzy. Do not drive, use machinery, or do anything that needs mental alertness until you know how this medicine affects you. Do not stand or sit up quickly, especially if you are an older patient. This reduces the risk of dizzy or fainting spells. What side effects may I notice from receiving this medicine? Side effects that you should report to your doctor or health care professional as soon as possible: -allergic reactions like skin rash, itching or hives, swelling of the face, lips, or tongue -confusion -dizziness -feeling faint or lightheaded -fever or chills -  palpitations -seizures -signs and symptoms of bleeding such as bloody or black, tarry stools; red or dark-brown urine; spitting up blood or brown material that looks like coffee grounds; red spots on the skin; unusual bruising or bleeding including from the eye, gums, or nose -signs and symptoms of  a blood clot such as breathing problems; changes in vision; chest pain; severe, sudden headache; pain, swelling, warmth in the leg; trouble speaking; sudden numbness or weakness of the face, arm or leg -signs and symptoms of kidney injury like trouble passing urine or change in the amount of urine -signs and symptoms of liver injury like dark yellow or brown urine; general ill feeling or flu-like symptoms; light-colored stools; loss of appetite; nausea; right upper belly pain; unusually weak or tired; yellowing of the eyes or skin Side effects that usually do not require medical attention (report to your doctor or health care professional if they continue or are bothersome): -back pain -cough -diarrhea -headache -muscle cramps -vomiting This list may not describe all possible side effects. Call your doctor for medical advice about side effects. You may report side effects to FDA at 1-800-FDA-1088. Where should I keep my medicine? This drug is given in a hospital or clinic and will not be stored at home. NOTE: This sheet is a summary. It may not cover all possible information. If you have questions about this medicine, talk to your doctor, pharmacist, or health care provider.  2018 Elsevier/Gold Standard (2015-03-14 13:39:23)  Cyclophosphamide injection What is this medicine? CYCLOPHOSPHAMIDE (sye kloe FOSS fa mide) is a chemotherapy drug. It slows the growth of cancer cells. This medicine is used to treat many types of cancer like lymphoma, myeloma, leukemia, breast cancer, and ovarian cancer, to name a few. This medicine may be used for other purposes; ask your health care provider or pharmacist if you have questions. COMMON BRAND NAME(S): Cytoxan, Neosar What should I tell my health care provider before I take this medicine? They need to know if you have any of these conditions: -blood disorders -history of other chemotherapy -infection -kidney disease -liver disease -recent or ongoing  radiation therapy -tumors in the bone marrow -an unusual or allergic reaction to cyclophosphamide, other chemotherapy, other medicines, foods, dyes, or preservatives -pregnant or trying to get pregnant -breast-feeding How should I use this medicine? This drug is usually given as an injection into a vein or muscle or by infusion into a vein. It is administered in a hospital or clinic by a specially trained health care professional. Talk to your pediatrician regarding the use of this medicine in children. Special care may be needed. Overdosage: If you think you have taken too much of this medicine contact a poison control center or emergency room at once. NOTE: This medicine is only for you. Do not share this medicine with others. What if I miss a dose? It is important not to miss your dose. Call your doctor or health care professional if you are unable to keep an appointment. What may interact with this medicine? This medicine may interact with the following medications: -amiodarone -amphotericin B -azathioprine -certain antiviral medicines for HIV or AIDS such as protease inhibitors (e.g., indinavir, ritonavir) and zidovudine -certain blood pressure medications such as benazepril, captopril, enalapril, fosinopril, lisinopril, moexipril, monopril, perindopril, quinapril, ramipril, trandolapril -certain cancer medications such as anthracyclines (e.g., daunorubicin, doxorubicin), busulfan, cytarabine, paclitaxel, pentostatin, tamoxifen, trastuzumab -certain diuretics such as chlorothiazide, chlorthalidone, hydrochlorothiazide, indapamide, metolazone -certain medicines that treat or prevent blood clots like warfarin -certain   muscle relaxants such as succinylcholine -cyclosporine -etanercept -indomethacin -medicines to increase blood counts like filgrastim, pegfilgrastim, sargramostim -medicines used as general anesthesia -metronidazole -natalizumab This list may not describe all possible  interactions. Give your health care provider a list of all the medicines, herbs, non-prescription drugs, or dietary supplements you use. Also tell them if you smoke, drink alcohol, or use illegal drugs. Some items may interact with your medicine. What should I watch for while using this medicine? Visit your doctor for checks on your progress. This drug may make you feel generally unwell. This is not uncommon, as chemotherapy can affect healthy cells as well as cancer cells. Report any side effects. Continue your course of treatment even though you feel ill unless your doctor tells you to stop. Drink water or other fluids as directed. Urinate often, even at night. In some cases, you may be given additional medicines to help with side effects. Follow all directions for their use. Call your doctor or health care professional for advice if you get a fever, chills or sore throat, or other symptoms of a cold or flu. Do not treat yourself. This drug decreases your body's ability to fight infections. Try to avoid being around people who are sick. This medicine may increase your risk to bruise or bleed. Call your doctor or health care professional if you notice any unusual bleeding. Be careful brushing and flossing your teeth or using a toothpick because you may get an infection or bleed more easily. If you have any dental work done, tell your dentist you are receiving this medicine. You may get drowsy or dizzy. Do not drive, use machinery, or do anything that needs mental alertness until you know how this medicine affects you. Do not become pregnant while taking this medicine or for 1 year after stopping it. Women should inform their doctor if they wish to become pregnant or think they might be pregnant. Men should not father a child while taking this medicine and for 4 months after stopping it. There is a potential for serious side effects to an unborn child. Talk to your health care professional or pharmacist for  more information. Do not breast-feed an infant while taking this medicine. This medicine may interfere with the ability to have a child. This medicine has caused ovarian failure in some women. This medicine has caused reduced sperm counts in some men. You should talk with your doctor or health care professional if you are concerned about your fertility. If you are going to have surgery, tell your doctor or health care professional that you have taken this medicine. What side effects may I notice from receiving this medicine? Side effects that you should report to your doctor or health care professional as soon as possible: -allergic reactions like skin rash, itching or hives, swelling of the face, lips, or tongue -low blood counts - this medicine may decrease the number of white blood cells, red blood cells and platelets. You may be at increased risk for infections and bleeding. -signs of infection - fever or chills, cough, sore throat, pain or difficulty passing urine -signs of decreased platelets or bleeding - bruising, pinpoint red spots on the skin, black, tarry stools, blood in the urine -signs of decreased red blood cells - unusually weak or tired, fainting spells, lightheadedness -breathing problems -dark urine -dizziness -palpitations -swelling of the ankles, feet, hands -trouble passing urine or change in the amount of urine -weight gain -yellowing of the eyes or skin Side effects that   usually do not require medical attention (report to your doctor or health care professional if they continue or are bothersome): -changes in nail or skin color -hair loss -missed menstrual periods -mouth sores -nausea, vomiting This list may not describe all possible side effects. Call your doctor for medical advice about side effects. You may report side effects to FDA at 1-800-FDA-1088. Where should I keep my medicine? This drug is given in a hospital or clinic and will not be stored at  home. NOTE: This sheet is a summary. It may not cover all possible information. If you have questions about this medicine, talk to your doctor, pharmacist, or health care provider.  2018 Elsevier/Gold Standard (2011-12-25 16:22:58)  

## 2017-08-23 NOTE — Patient Instructions (Signed)
Implanted Port Home Guide An implanted port is a type of central line that is placed under the skin. Central lines are used to provide IV access when treatment or nutrition needs to be given through a person's veins. Implanted ports are used for long-term IV access. An implanted port may be placed because:  You need IV medicine that would be irritating to the small veins in your hands or arms.  You need long-term IV medicines, such as antibiotics.  You need IV nutrition for a long period.  You need frequent blood draws for lab tests.  You need dialysis.  Implanted ports are usually placed in the chest area, but they can also be placed in the upper arm, the abdomen, or the leg. An implanted port has two main parts:  Reservoir. The reservoir is round and will appear as a small, raised area under your skin. The reservoir is the part where a needle is inserted to give medicines or draw blood.  Catheter. The catheter is a thin, flexible tube that extends from the reservoir. The catheter is placed into a large vein. Medicine that is inserted into the reservoir goes into the catheter and then into the vein.  How will I care for my incision site? Do not get the incision site wet. Bathe or shower as directed by your health care provider. How is my port accessed? Special steps must be taken to access the port:  Before the port is accessed, a numbing cream can be placed on the skin. This helps numb the skin over the port site.  Your health care provider uses a sterile technique to access the port. ? Your health care provider must put on a mask and sterile gloves. ? The skin over your port is cleaned carefully with an antiseptic and allowed to dry. ? The port is gently pinched between sterile gloves, and a needle is inserted into the port.  Only "non-coring" port needles should be used to access the port. Once the port is accessed, a blood return should be checked. This helps ensure that the port  is in the vein and is not clogged.  If your port needs to remain accessed for a constant infusion, a clear (transparent) bandage will be placed over the needle site. The bandage and needle will need to be changed every week, or as directed by your health care provider.  Keep the bandage covering the needle clean and dry. Do not get it wet. Follow your health care provider's instructions on how to take a shower or bath while the port is accessed.  If your port does not need to stay accessed, no bandage is needed over the port.  What is flushing? Flushing helps keep the port from getting clogged. Follow your health care provider's instructions on how and when to flush the port. Ports are usually flushed with saline solution or a medicine called heparin. The need for flushing will depend on how the port is used.  If the port is used for intermittent medicines or blood draws, the port will need to be flushed: ? After medicines have been given. ? After blood has been drawn. ? As part of routine maintenance.  If a constant infusion is running, the port may not need to be flushed.  How long will my port stay implanted? The port can stay in for as long as your health care provider thinks it is needed. When it is time for the port to come out, surgery will be   done to remove it. The procedure is similar to the one performed when the port was put in. When should I seek immediate medical care? When you have an implanted port, you should seek immediate medical care if:  You notice a bad smell coming from the incision site.  You have swelling, redness, or drainage at the incision site.  You have more swelling or pain at the port site or the surrounding area.  You have a fever that is not controlled with medicine.  This information is not intended to replace advice given to you by your health care provider. Make sure you discuss any questions you have with your health care provider. Document  Released: 02/09/2005 Document Revised: 07/18/2015 Document Reviewed: 10/17/2012 Elsevier Interactive Patient Education  2017 [employer]  

## 2017-08-24 ENCOUNTER — Other Ambulatory Visit: Payer: Self-pay | Admitting: Family

## 2017-08-24 DIAGNOSIS — C9000 Multiple myeloma not having achieved remission: Secondary | ICD-10-CM

## 2017-08-24 DIAGNOSIS — M2550 Pain in unspecified joint: Secondary | ICD-10-CM

## 2017-08-24 DIAGNOSIS — R52 Pain, unspecified: Secondary | ICD-10-CM

## 2017-09-06 ENCOUNTER — Inpatient Hospital Stay: Payer: Medicare Other

## 2017-09-06 ENCOUNTER — Encounter: Payer: Self-pay | Admitting: Family

## 2017-09-06 ENCOUNTER — Other Ambulatory Visit: Payer: Self-pay

## 2017-09-06 ENCOUNTER — Inpatient Hospital Stay (HOSPITAL_BASED_OUTPATIENT_CLINIC_OR_DEPARTMENT_OTHER): Payer: Medicare Other | Admitting: Family

## 2017-09-06 VITALS — BP 123/60 | HR 63 | Temp 98.0°F | Resp 16 | Wt 194.0 lb

## 2017-09-06 DIAGNOSIS — C9000 Multiple myeloma not having achieved remission: Secondary | ICD-10-CM

## 2017-09-06 DIAGNOSIS — Z299 Encounter for prophylactic measures, unspecified: Secondary | ICD-10-CM

## 2017-09-06 DIAGNOSIS — D631 Anemia in chronic kidney disease: Secondary | ICD-10-CM

## 2017-09-06 DIAGNOSIS — N189 Chronic kidney disease, unspecified: Secondary | ICD-10-CM

## 2017-09-06 DIAGNOSIS — E291 Testicular hypofunction: Secondary | ICD-10-CM

## 2017-09-06 DIAGNOSIS — D509 Iron deficiency anemia, unspecified: Secondary | ICD-10-CM | POA: Diagnosis not present

## 2017-09-06 DIAGNOSIS — E349 Endocrine disorder, unspecified: Secondary | ICD-10-CM

## 2017-09-06 DIAGNOSIS — D500 Iron deficiency anemia secondary to blood loss (chronic): Secondary | ICD-10-CM

## 2017-09-06 DIAGNOSIS — Z5112 Encounter for antineoplastic immunotherapy: Secondary | ICD-10-CM | POA: Diagnosis not present

## 2017-09-06 DIAGNOSIS — M4712 Other spondylosis with myelopathy, cervical region: Secondary | ICD-10-CM

## 2017-09-06 LAB — CBC WITH DIFFERENTIAL (CANCER CENTER ONLY)
Basophils Absolute: 0.1 10*3/uL (ref 0.0–0.1)
Basophils Relative: 1 %
EOS PCT: 5 %
Eosinophils Absolute: 0.2 10*3/uL (ref 0.0–0.5)
HCT: 31.2 % — ABNORMAL LOW (ref 38.7–49.9)
Hemoglobin: 10.3 g/dL — ABNORMAL LOW (ref 13.0–17.1)
LYMPHS ABS: 0.8 10*3/uL — AB (ref 0.9–3.3)
Lymphocytes Relative: 17 %
MCH: 26.2 pg — AB (ref 28.0–33.4)
MCHC: 33 g/dL (ref 32.0–35.9)
MCV: 79.4 fL — ABNORMAL LOW (ref 82.0–98.0)
MONO ABS: 0.7 10*3/uL (ref 0.1–0.9)
Monocytes Relative: 14 %
Neutro Abs: 3 10*3/uL (ref 1.5–6.5)
Neutrophils Relative %: 63 %
PLATELETS: 199 10*3/uL (ref 145–400)
RBC: 3.93 MIL/uL — AB (ref 4.20–5.70)
RDW: 18.7 % — AB (ref 11.1–15.7)
WBC: 4.8 10*3/uL (ref 4.0–10.0)

## 2017-09-06 LAB — CMP (CANCER CENTER ONLY)
ALT: 24 U/L (ref 10–47)
AST: 25 U/L (ref 11–38)
Albumin: 3.4 g/dL — ABNORMAL LOW (ref 3.5–5.0)
Alkaline Phosphatase: 64 U/L (ref 26–84)
Anion gap: 11 (ref 5–15)
BUN: 34 mg/dL — AB (ref 7–22)
CO2: 32 mmol/L (ref 18–33)
Calcium: 8.8 mg/dL (ref 8.0–10.3)
Chloride: 100 mmol/L (ref 98–108)
Creatinine: 2.1 mg/dL — ABNORMAL HIGH (ref 0.60–1.20)
GLUCOSE: 105 mg/dL (ref 73–118)
POTASSIUM: 4.1 mmol/L (ref 3.3–4.7)
SODIUM: 143 mmol/L (ref 128–145)
Total Bilirubin: 0.6 mg/dL (ref 0.2–1.6)
Total Protein: 7 g/dL (ref 6.4–8.1)

## 2017-09-06 LAB — IRON AND TIBC
Iron: 91 ug/dL (ref 42–163)
SATURATION RATIOS: 40 % — AB (ref 42–163)
TIBC: 225 ug/dL (ref 202–409)
UIBC: 134 ug/dL

## 2017-09-06 LAB — FERRITIN: Ferritin: 2378 ng/mL — ABNORMAL HIGH (ref 24–336)

## 2017-09-06 LAB — LACTATE DEHYDROGENASE: LDH: 215 U/L — ABNORMAL HIGH (ref 98–192)

## 2017-09-06 LAB — RETICULOCYTES
RBC.: 4.01 MIL/uL — ABNORMAL LOW (ref 4.20–5.82)
RETIC COUNT ABSOLUTE: 32.1 10*3/uL — AB (ref 34.8–93.9)
RETIC CT PCT: 0.8 % (ref 0.8–1.8)

## 2017-09-06 MED ORDER — HYDROMORPHONE HCL 2 MG/ML IJ SOLN
INTRAMUSCULAR | Status: AC
  Filled 2017-09-06: qty 1

## 2017-09-06 MED ORDER — SODIUM CHLORIDE 0.9 % IV SOLN
Freq: Once | INTRAVENOUS | Status: AC
Start: 2017-09-06 — End: 2017-09-06
  Administered 2017-09-06: 10:00:00 via INTRAVENOUS

## 2017-09-06 MED ORDER — SODIUM CHLORIDE 0.9% FLUSH
10.0000 mL | INTRAVENOUS | Status: DC | PRN
  Administered 2017-09-06: 10 mL
  Filled 2017-09-06: qty 10

## 2017-09-06 MED ORDER — SODIUM CHLORIDE 0.9 % IV SOLN
Freq: Once | INTRAVENOUS | Status: AC
  Administered 2017-09-06: 10:00:00 via INTRAVENOUS

## 2017-09-06 MED ORDER — DEXTROSE 5 % IV SOLN
29.0000 mg/m2 | Freq: Once | INTRAVENOUS | Status: AC
  Administered 2017-09-06: 60 mg via INTRAVENOUS
  Filled 2017-09-06: qty 30

## 2017-09-06 MED ORDER — DEXAMETHASONE SODIUM PHOSPHATE 10 MG/ML IJ SOLN
10.0000 mg | Freq: Once | INTRAMUSCULAR | Status: AC
  Administered 2017-09-06: 10 mg via INTRAVENOUS

## 2017-09-06 MED ORDER — SODIUM CHLORIDE 0.9 % IV SOLN
300.0000 mg/m2 | Freq: Once | INTRAVENOUS | Status: AC
  Administered 2017-09-06: 620 mg via INTRAVENOUS
  Filled 2017-09-06: qty 31

## 2017-09-06 MED ORDER — TESTOSTERONE CYPIONATE 200 MG/ML IM SOLN
300.0000 mg | Freq: Once | INTRAMUSCULAR | Status: AC
  Administered 2017-09-06: 300 mg via INTRAMUSCULAR

## 2017-09-06 MED ORDER — PALONOSETRON HCL INJECTION 0.25 MG/5ML
0.2500 mg | Freq: Once | INTRAVENOUS | Status: AC
  Administered 2017-09-06: 0.25 mg via INTRAVENOUS

## 2017-09-06 MED ORDER — HYDROMORPHONE HCL 2 MG/ML IJ SOLN
2.0000 mg | Freq: Once | INTRAMUSCULAR | Status: AC
  Administered 2017-09-06: 2 mg via INTRAVENOUS

## 2017-09-06 MED ORDER — DARBEPOETIN ALFA 300 MCG/0.6ML IJ SOSY
300.0000 ug | PREFILLED_SYRINGE | Freq: Once | INTRAMUSCULAR | Status: AC
  Administered 2017-09-06: 300 ug via SUBCUTANEOUS

## 2017-09-06 MED ORDER — PALONOSETRON HCL INJECTION 0.25 MG/5ML
INTRAVENOUS | Status: AC
  Filled 2017-09-06: qty 5

## 2017-09-06 MED ORDER — HEPARIN SOD (PORK) LOCK FLUSH 100 UNIT/ML IV SOLN
500.0000 [IU] | Freq: Once | INTRAVENOUS | Status: AC | PRN
  Administered 2017-09-06: 500 [IU]
  Filled 2017-09-06: qty 5

## 2017-09-06 MED ORDER — DARBEPOETIN ALFA 300 MCG/0.6ML IJ SOSY
PREFILLED_SYRINGE | INTRAMUSCULAR | Status: AC
  Filled 2017-09-06: qty 0.6

## 2017-09-06 MED ORDER — DEXAMETHASONE SODIUM PHOSPHATE 10 MG/ML IJ SOLN
INTRAMUSCULAR | Status: AC
  Filled 2017-09-06: qty 1

## 2017-09-06 MED ORDER — PAMIDRONATE DISODIUM 60 MG/10ML IV SOLN
60.0000 mg | Freq: Once | INTRAVENOUS | Status: AC
  Administered 2017-09-06: 60 mg via INTRAVENOUS
  Filled 2017-09-06: qty 6.67

## 2017-09-06 MED ORDER — TESTOSTERONE CYPIONATE 200 MG/ML IM SOLN
INTRAMUSCULAR | Status: AC
  Filled 2017-09-06: qty 2

## 2017-09-06 NOTE — Patient Instructions (Signed)
[facility] Discharge Instructions for Patients Receiving Chemotherapy  Today you received the following chemotherapy agents Kyprolis, Cytoxan and Aredia.  To help prevent nausea and vomiting after your treatment, we encourage you to take your nausea medication as directed BUT NO ZOFRAN FOR 3 DAYS AFTER CHEMO.    If you develop nausea and vomiting that is not controlled by your nausea medication, call the clinic.   BELOW ARE SYMPTOMS THAT SHOULD BE REPORTED IMMEDIATELY:  *FEVER GREATER THAN 100.5 F  *CHILLS WITH OR WITHOUT FEVER  NAUSEA AND VOMITING THAT IS NOT CONTROLLED WITH YOUR NAUSEA MEDICATION  *UNUSUAL SHORTNESS OF BREATH  *UNUSUAL BRUISING OR BLEEDING  TENDERNESS IN MOUTH AND THROAT WITH OR WITHOUT PRESENCE OF ULCERS  *URINARY PROBLEMS  *BOWEL PROBLEMS  UNUSUAL RASH Items with * indicate a potential emergency and should be followed up as soon as possible.  Feel free to call the clinic you have any questions or concerns. The clinic phone number is [phone].  Please show the [place] at check-in to the Emergency Department and triage nurse.

## 2017-09-06 NOTE — Progress Notes (Signed)
Ok to treat with creatinine level today per Judson Roch NP

## 2017-09-06 NOTE — Patient Instructions (Signed)
Implanted Port Home Guide An implanted port is a type of central line that is placed under the skin. Central lines are used to provide IV access when treatment or nutrition needs to be given through a person's veins. Implanted ports are used for long-term IV access. An implanted port may be placed because:  You need IV medicine that would be irritating to the small veins in your hands or arms.  You need long-term IV medicines, such as antibiotics.  You need IV nutrition for a long period.  You need frequent blood draws for lab tests.  You need dialysis.  Implanted ports are usually placed in the chest area, but they can also be placed in the upper arm, the abdomen, or the leg. An implanted port has two main parts:  Reservoir. The reservoir is round and will appear as a small, raised area under your skin. The reservoir is the part where a needle is inserted to give medicines or draw blood.  Catheter. The catheter is a thin, flexible tube that extends from the reservoir. The catheter is placed into a large vein. Medicine that is inserted into the reservoir goes into the catheter and then into the vein.  How will I care for my incision site? Do not get the incision site wet. Bathe or shower as directed by your health care provider. How is my port accessed? Special steps must be taken to access the port:  Before the port is accessed, a numbing cream can be placed on the skin. This helps numb the skin over the port site.  Your health care provider uses a sterile technique to access the port. ? Your health care provider must put on a mask and sterile gloves. ? The skin over your port is cleaned carefully with an antiseptic and allowed to dry. ? The port is gently pinched between sterile gloves, and a needle is inserted into the port.  Only "non-coring" port needles should be used to access the port. Once the port is accessed, a blood return should be checked. This helps ensure that the port  is in the vein and is not clogged.  If your port needs to remain accessed for a constant infusion, a clear (transparent) bandage will be placed over the needle site. The bandage and needle will need to be changed every week, or as directed by your health care provider.  Keep the bandage covering the needle clean and dry. Do not get it wet. Follow your health care provider's instructions on how to take a shower or bath while the port is accessed.  If your port does not need to stay accessed, no bandage is needed over the port.  What is flushing? Flushing helps keep the port from getting clogged. Follow your health care provider's instructions on how and when to flush the port. Ports are usually flushed with saline solution or a medicine called heparin. The need for flushing will depend on how the port is used.  If the port is used for intermittent medicines or blood draws, the port will need to be flushed: ? After medicines have been given. ? After blood has been drawn. ? As part of routine maintenance.  If a constant infusion is running, the port may not need to be flushed.  How long will my port stay implanted? The port can stay in for as long as your health care provider thinks it is needed. When it is time for the port to come out, surgery will be   done to remove it. The procedure is similar to the one performed when the port was put in. When should I seek immediate medical care? When you have an implanted port, you should seek immediate medical care if:  You notice a bad smell coming from the incision site.  You have swelling, redness, or drainage at the incision site.  You have more swelling or pain at the port site or the surrounding area.  You have a fever that is not controlled with medicine.  This information is not intended to replace advice given to you by your health care provider. Make sure you discuss any questions you have with your health care provider. Document  Released: 02/09/2005 Document Revised: 07/18/2015 Document Reviewed: 10/17/2012 Elsevier Interactive Patient Education  2017 [employer]  

## 2017-09-06 NOTE — Progress Notes (Signed)
Hematology and Oncology Follow Up Visit  [name] [id number] Jun 01, 1950 67 y.o. 09/06/2017   Principle Diagnosis:  IgG kappa myeloma Anemia secondary to renal insufficiency Intermittent iron - deficiency anemia Hypotestosteronemia  Current Therapy:   Aredia 60 mg IV q 2 months  Aranesp 300 mcg subcu as needed for hemoglobin less than 11 DepoTestosterone 300 mg q 4 weeks IV iron as indicated Kyprolis/Cytoxan - s/p cycle8   Interim History:  [name] is here today for follow-up and treatment. He is feeling fatigued and states that he has not been sleeping well at night.  He has chronic back pain that waxes and wanes. This is unchanged.  His M-spike in June was 0.3, IgG level 1,013 and kappa light chain was 2.75 mg/dL. 0 No lymphadenopathy noted on his exam.  No episodes of bleeding, no bruising or petechiae.  No fever, chills, n/v, cough, rash, dizziness, SOB, chest pain, palpitations or changes in bowel or bladder habits.  He take a stool softener as needed for constipation. He states that carafate helps with any abdominal pain or upset.  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 No swelling or tenderness in his extremities at this time.  He is eating well and staying hydrated. His weight is stable.   ECOG Performance Status: 1 - Symptomatic but completely ambulatory  Medications:  Allergies as of 09/06/2017      Reactions   Iodinated Diagnostic Agents Rash   Patient states he was instructed not to take IV contrast.  In 2008 he had an unknown reaction, and was told not to take it again.  He was also told not to take it due to his kidneys.   Iodine Anxiety, Rash, Other (See Comments)   Didn't feel right "instructed not to take per MD--something with his port" Didn't feel right Didn't feel right "instructed not to take per MD--something with his port"      Medication List        Accurate as of 09/06/17  9:00 AM. Always use your most  recent med list.          amLODipine 10 MG tablet Commonly known as:  NORVASC Take 10 mg by mouth daily.   CARAFATE 1 GM/10ML suspension Generic drug:  sucralfate Take 1 g by mouth 4 (four) times daily.   cefdinir 300 MG capsule Commonly known as:  OMNICEF Take 1 capsule (300 mg total) by mouth 2 (two) times daily.   ciclopirox 8 % solution Commonly known as:  PENLAC APP QD. REMOVE WEEKLY WITH NAIL POLISH REMOVER THEN REAPPLY   CLEAR EYES ALL SEASONS 5-6 MG/ML Soln Generic drug:  Polyvinyl Alcohol-Povidone Place 2 drops into both eyes 3 (three) times daily as needed (drye eyes.).   dicyclomine 10 MG capsule Commonly known as:  BENTYL Take 1 capsule (10 mg total) by mouth 3 (three) times daily.   EMBEDA 20-0.8 MG Cpcr Generic drug:  Morphine-Naltrexone Take 1 capsule by mouth 3 (three) times daily.   EMBEDA 30-1.2 MG Cpcr Generic drug:  Morphine-Naltrexone Take 3 (three) times daily by mouth.   furosemide 40 MG tablet Commonly known as:  LASIX Take 40 mg by mouth.   glipiZIDE 5 MG tablet Commonly known as:  GLUCOTROL Take 5 mg by mouth 2 (two) times daily before a meal.   isosorbide mononitrate 30 MG 24 hr tablet Commonly known as:  IMDUR Take 30 mg by mouth daily.   latanoprost 0.005 % ophthalmic solution Commonly  known as:  XALATAN Place 1 drop into the right eye every evening.   lidocaine-prilocaine cream Commonly known as:  EMLA Apply 1 application topically as needed.   linaclotide 145 MCG Caps capsule Commonly known as:  LINZESS Take 1 capsule (145 mcg total) by mouth daily before breakfast.   LORazepam 0.5 MG tablet Commonly known as:  ATIVAN Take 1 tablet (0.5 mg total) by mouth every 6 (six) hours as needed (Nausea or vomiting).   losartan 25 MG tablet Commonly known as:  COZAAR Take 25 mg by mouth.   meclizine 50 MG tablet Commonly known as:  ANTIVERT Take 0.5 tablets (25 mg total) by mouth 4 (four) times daily as needed for dizziness.    methocarbamol 500 MG tablet Commonly known as:  ROBAXIN Take 500 mg by mouth 4 (four) times daily.   mirabegron ER 50 MG Tb24 tablet Commonly known as:  MYRBETRIQ Take 50 mg by mouth daily.   omeprazole 40 MG capsule Commonly known as:  PRILOSEC take 1 capsule by mouth twice a day   ondansetron 4 MG disintegrating tablet Commonly known as:  ZOFRAN ODT Take 1 tablet (4 mg total) by mouth every 8 (eight) hours as needed for nausea or vomiting.   ondansetron 8 MG tablet Commonly known as:  ZOFRAN Take 1 tablet (8 mg total) by mouth 2 (two) times daily as needed (Nausea or vomiting).   ondansetron 8 MG tablet Commonly known as:  ZOFRAN TAKE 1 TABLET(8 MG) BY MOUTH TWICE DAILY AS NEEDED FOR NAUSEA OR VOMITING   orphenadrine 100 MG tablet Commonly known as:  NORFLEX Take 1 tablet (100 mg total) by mouth 2 (two) times daily.   oxyCODONE 20 mg 12 hr tablet Commonly known as:  OXYCONTIN Take 1 tablet (20 mg total) by mouth every 12 (twelve) hours.   oxyCODONE-acetaminophen 10-325 MG tablet Commonly known as:  PERCOCET Take 1 tablet by mouth every 6 (six) hours as needed for pain.   potassium chloride SA 20 MEQ tablet Commonly known as:  K-DUR,KLOR-CON Take 1 tablet (20 mEq total) by mouth 2 (two) times daily.   prochlorperazine 10 MG tablet Commonly known as:  COMPAZINE take 1 tablet by mouth every 6 hours if needed for nausea and vomiting   TOPROL XL 50 MG 24 hr tablet Generic drug:  metoprolol succinate Take 50 mg by mouth daily.   traMADol 50 MG tablet Commonly known as:  ULTRAM Take 1 tablet (50 mg total) by mouth every 6 (six) hours as needed.   traMADol 50 MG tablet Commonly known as:  ULTRAM TAKE 1 TABLET(50 MG) BY MOUTH EVERY 6 HOURS AS NEEDED   Vitamin D (Ergocalciferol) 50000 units Caps capsule Commonly known as:  DRISDOL Take 50,000 Units by mouth every Saturday.   ZIOPTAN 0.0015 % Soln Generic drug:  Tafluprost Place 1 drop into both eyes daily.        Allergies:  Allergies  Allergen Reactions  . Iodinated Diagnostic Agents Rash    Patient states he was instructed not to take IV contrast.  In 2008 he had an unknown reaction, and was told not to take it again.  He was also told not to take it due to his kidneys.  . Iodine Anxiety, Rash and Other (See Comments)    Didn't feel right "instructed not to take per MD--something with his port" Didn't feel right Didn't feel right "instructed not to take per MD--something with his port"    Past Medical History, Surgical history,  Social history, and Family History were reviewed and updated.  Review of Systems: All other 10 point review of systems is negative.   Physical Exam:  weight is 194 lb (88 kg). His oral temperature is 98 F (36.7 C). His blood pressure is 123/60 and his pulse is 63. His respiration is 16 and oxygen saturation is 100%.   Wt Readings from Last 3 Encounters:  09/06/17 194 lb (88 kg)  08/09/17 191 lb (86.6 kg)  07/26/17 185 lb 1.3 oz (84 kg)    Ocular: Sclerae unicteric, pupils equal, round and reactive to light Ear-nose-throat: Oropharynx clear, dentition fair Lymphatic: No cervical, supraclavicular or axillary adenopathy Lungs no rales or rhonchi, good excursion bilaterally Heart regular rate and rhythm, no murmur appreciated Abd soft, nontender, positive bowel sounds, no liver or spleen tip palpated on exam, no fluid wave  MSK no focal spinal tenderness, no joint edema Neuro: non-focal, well-oriented, appropriate affect Breasts: Deferred   Lab Results  Component Value Date   WBC 4.8 09/06/2017   HGB 10.3 (L) 09/06/2017   HCT 31.2 (L) 09/06/2017   MCV 79.4 (L) 09/06/2017   PLT 199 09/06/2017   Lab Results  Component Value Date   FERRITIN 2,280 (H) 08/09/2017   IRON 70 08/09/2017   TIBC 219 08/09/2017   UIBC 149 08/09/2017   IRONPCTSAT 32 (L) 08/09/2017   Lab Results  Component Value Date   RETICCTPCT 0.6 03/21/2014   RBC 3.93 (L) 09/06/2017    RETICCTABS 27.5 03/21/2014   Lab Results  Component Value Date   KPAFRELGTCHN 27.5 (H) 08/09/2017   LAMBDASER 17.4 08/09/2017   KAPLAMBRATIO 1.58 08/09/2017   Lab Results  Component Value Date   IGGSERUM 1,013 08/09/2017   IGGSERUM 1,087 08/09/2017   IGA 171 08/09/2017   IGA 181 08/09/2017   IGMSERUM 25 08/09/2017   IGMSERUM 25 08/09/2017   Lab Results  Component Value Date   TOTALPROTELP 6.6 08/09/2017   ALBUMINELP 3.7 08/09/2017   A1GS 0.2 08/09/2017   A2GS 1.0 08/09/2017   BETS 0.8 08/09/2017   BETA2SER 0.5 11/07/2014   GAMS 0.9 08/09/2017   MSPIKE 0.3 (H) 08/09/2017   SPEI Comment 05/31/2017     Chemistry      Component Value Date/Time   NA 143 09/06/2017 0752   NA 143 02/22/2017 0744   K 4.1 09/06/2017 0752   K 4.0 02/22/2017 0744   CL 100 09/06/2017 0752   CL 106 02/22/2017 0744   CO2 32 09/06/2017 0752   CO2 27 02/22/2017 0744   BUN 34 (H) 09/06/2017 0752   BUN 26 (H) 02/22/2017 0744   CREATININE 2.10 (H) 09/06/2017 0752   CREATININE 2.3 (H) 02/22/2017 0744      Component Value Date/Time   CALCIUM 8.8 09/06/2017 0752   CALCIUM 9.3 02/22/2017 0744   ALKPHOS 64 09/06/2017 0752   ALKPHOS 80 02/22/2017 0744   AST 25 09/06/2017 0752   ALT 24 09/06/2017 0752   ALT 22 02/22/2017 0744   BILITOT 0.6 09/06/2017 0752      Impression and Plan: Mr. Griffith is a very pleasant 67 yo African American gentleman with IgG kappa myeloma. He continues to tolerate treatment nicely and his M-spike so far, has remained stable.  We will proceed with treatment today as planned.  We will see what his iron studies show and bring him back in for infusion if needed.  We will plan to see him back in another 4 weeks.  He will contact our  office with any questions or concerns. We can certainly see him sooner if need be.   Laverna Peace, NP 7/15/20199:00 AM

## 2017-09-07 LAB — TESTOSTERONE,FREE AND TOTAL
TESTOSTERONE: 240 ng/dL — AB (ref 264–916)
Testosterone, Free: 3.5 pg/mL — ABNORMAL LOW (ref 6.6–18.1)

## 2017-09-07 LAB — IGG, IGA, IGM
IGM (IMMUNOGLOBULIN M), SRM: 22 mg/dL (ref 20–172)
IgA: 162 mg/dL (ref 61–437)
IgG (Immunoglobin G), Serum: 974 mg/dL (ref 700–1600)

## 2017-09-07 LAB — KAPPA/LAMBDA LIGHT CHAINS
KAPPA, LAMDA LIGHT CHAIN RATIO: 1.59 (ref 0.26–1.65)
Kappa free light chain: 33.6 mg/L — ABNORMAL HIGH (ref 3.3–19.4)
Lambda free light chains: 21.1 mg/L (ref 5.7–26.3)

## 2017-09-07 LAB — ERYTHROPOIETIN: Erythropoietin: 9.1 m[IU]/mL (ref 2.6–18.5)

## 2017-09-09 LAB — PROTEIN ELECTROPHORESIS, SERUM, WITH REFLEX
A/G Ratio: 1.1 (ref 0.7–1.7)
ALPHA-1-GLOBULIN: 0.2 g/dL (ref 0.0–0.4)
ALPHA-2-GLOBULIN: 1.1 g/dL — AB (ref 0.4–1.0)
Albumin ELP: 3.4 g/dL (ref 2.9–4.4)
Beta Globulin: 0.9 g/dL (ref 0.7–1.3)
Gamma Globulin: 1 g/dL (ref 0.4–1.8)
Globulin, Total: 3.2 g/dL (ref 2.2–3.9)
M-SPIKE, %: 0.6 g/dL — AB
SPEP Interpretation: 0
Total Protein ELP: 6.6 g/dL (ref 6.0–8.5)

## 2017-09-09 LAB — IMMUNOFIXATION REFLEX, SERUM
IGA: 169 mg/dL (ref 61–437)
IGG (IMMUNOGLOBIN G), SERUM: 1002 mg/dL (ref 700–1600)
IgM (Immunoglobulin M), Srm: 27 mg/dL (ref 20–172)

## 2017-09-13 ENCOUNTER — Inpatient Hospital Stay: Payer: Medicare Other

## 2017-09-13 VITALS — BP 139/71 | HR 72 | Temp 98.1°F | Resp 17

## 2017-09-13 DIAGNOSIS — Z299 Encounter for prophylactic measures, unspecified: Secondary | ICD-10-CM

## 2017-09-13 DIAGNOSIS — Z5112 Encounter for antineoplastic immunotherapy: Secondary | ICD-10-CM | POA: Diagnosis not present

## 2017-09-13 DIAGNOSIS — C9000 Multiple myeloma not having achieved remission: Secondary | ICD-10-CM

## 2017-09-13 DIAGNOSIS — N189 Chronic kidney disease, unspecified: Principal | ICD-10-CM

## 2017-09-13 DIAGNOSIS — E349 Endocrine disorder, unspecified: Secondary | ICD-10-CM

## 2017-09-13 DIAGNOSIS — D631 Anemia in chronic kidney disease: Secondary | ICD-10-CM

## 2017-09-13 LAB — CMP (CANCER CENTER ONLY)
ALK PHOS: 65 U/L (ref 26–84)
ALT: 24 U/L (ref 10–47)
AST: 19 U/L (ref 11–38)
Albumin: 3.4 g/dL — ABNORMAL LOW (ref 3.5–5.0)
Anion gap: 8 (ref 5–15)
BUN: 23 mg/dL — AB (ref 7–22)
CALCIUM: 9.2 mg/dL (ref 8.0–10.3)
CO2: 31 mmol/L (ref 18–33)
Chloride: 102 mmol/L (ref 98–108)
Creatinine: 2.2 mg/dL — ABNORMAL HIGH (ref 0.60–1.20)
GLUCOSE: 114 mg/dL (ref 73–118)
POTASSIUM: 4.4 mmol/L (ref 3.3–4.7)
SODIUM: 141 mmol/L (ref 128–145)
Total Bilirubin: 0.6 mg/dL (ref 0.2–1.6)
Total Protein: 6.8 g/dL (ref 6.4–8.1)

## 2017-09-13 LAB — CBC WITH DIFFERENTIAL (CANCER CENTER ONLY)
BASOS ABS: 0 10*3/uL (ref 0.0–0.1)
BASOS PCT: 1 %
EOS PCT: 4 %
Eosinophils Absolute: 0.3 10*3/uL (ref 0.0–0.5)
HEMATOCRIT: 33.3 % — AB (ref 38.7–49.9)
Hemoglobin: 10.8 g/dL — ABNORMAL LOW (ref 13.0–17.1)
Lymphocytes Relative: 15 %
Lymphs Abs: 1 10*3/uL (ref 0.9–3.3)
MCH: 26.2 pg — ABNORMAL LOW (ref 28.0–33.4)
MCHC: 32.4 g/dL (ref 32.0–35.9)
MCV: 80.8 fL — AB (ref 82.0–98.0)
MONO ABS: 1 10*3/uL — AB (ref 0.1–0.9)
MONOS PCT: 16 %
NEUTROS ABS: 4.2 10*3/uL (ref 1.5–6.5)
Neutrophils Relative %: 64 %
PLATELETS: 203 10*3/uL (ref 145–400)
RBC: 4.12 MIL/uL — ABNORMAL LOW (ref 4.20–5.70)
RDW: 19.7 % — AB (ref 11.1–15.7)
WBC Count: 6.5 10*3/uL (ref 4.0–10.0)

## 2017-09-13 MED ORDER — SODIUM CHLORIDE 0.9 % IV SOLN
Freq: Once | INTRAVENOUS | Status: AC
  Administered 2017-09-13: 08:00:00 via INTRAVENOUS

## 2017-09-13 MED ORDER — DEXAMETHASONE SODIUM PHOSPHATE 10 MG/ML IJ SOLN
10.0000 mg | Freq: Once | INTRAMUSCULAR | Status: AC
  Administered 2017-09-13: 10 mg via INTRAVENOUS

## 2017-09-13 MED ORDER — HYDROMORPHONE HCL 2 MG/ML IJ SOLN
2.0000 mg | Freq: Once | INTRAMUSCULAR | Status: AC
  Administered 2017-09-13: 2 mg via INTRAVENOUS

## 2017-09-13 MED ORDER — SODIUM CHLORIDE 0.9 % IV SOLN
300.0000 mg/m2 | Freq: Once | INTRAVENOUS | Status: AC
  Administered 2017-09-13: 620 mg via INTRAVENOUS
  Filled 2017-09-13: qty 31

## 2017-09-13 MED ORDER — SODIUM CHLORIDE 0.9% FLUSH
10.0000 mL | INTRAVENOUS | Status: DC | PRN
  Administered 2017-09-13: 10 mL
  Filled 2017-09-13: qty 10

## 2017-09-13 MED ORDER — PALONOSETRON HCL INJECTION 0.25 MG/5ML
INTRAVENOUS | Status: AC
Start: 2017-09-13 — End: ?
  Filled 2017-09-13: qty 5

## 2017-09-13 MED ORDER — DEXTROSE 5 % IV SOLN
29.0000 mg/m2 | Freq: Once | INTRAVENOUS | Status: AC
  Administered 2017-09-13: 60 mg via INTRAVENOUS
  Filled 2017-09-13: qty 30

## 2017-09-13 MED ORDER — PALONOSETRON HCL INJECTION 0.25 MG/5ML
0.2500 mg | Freq: Once | INTRAVENOUS | Status: AC
  Administered 2017-09-13: 0.25 mg via INTRAVENOUS

## 2017-09-13 MED ORDER — DEXAMETHASONE SODIUM PHOSPHATE 10 MG/ML IJ SOLN
INTRAMUSCULAR | Status: AC
  Filled 2017-09-13: qty 1

## 2017-09-13 MED ORDER — HEPARIN SOD (PORK) LOCK FLUSH 100 UNIT/ML IV SOLN
500.0000 [IU] | Freq: Once | INTRAVENOUS | Status: AC | PRN
  Administered 2017-09-13: 500 [IU]
  Filled 2017-09-13: qty 5

## 2017-09-13 MED ORDER — HYDROMORPHONE HCL 2 MG/ML IJ SOLN
INTRAMUSCULAR | Status: AC
  Filled 2017-09-13: qty 1

## 2017-09-13 MED ORDER — SODIUM CHLORIDE 0.9 % IV SOLN: Freq: Once | INTRAVENOUS | Status: AC

## 2017-09-13 NOTE — Patient Instructions (Signed)
Implanted Port Home Guide An implanted port is a type of central line that is placed under the skin. Central lines are used to provide IV access when treatment or nutrition needs to be given through a person's veins. Implanted ports are used for long-term IV access. An implanted port may be placed because:  You need IV medicine that would be irritating to the small veins in your hands or arms.  You need long-term IV medicines, such as antibiotics.  You need IV nutrition for a long period.  You need frequent blood draws for lab tests.  You need dialysis.  Implanted ports are usually placed in the chest area, but they can also be placed in the upper arm, the abdomen, or the leg. An implanted port has two main parts:  Reservoir. The reservoir is round and will appear as a small, raised area under your skin. The reservoir is the part where a needle is inserted to give medicines or draw blood.  Catheter. The catheter is a thin, flexible tube that extends from the reservoir. The catheter is placed into a large vein. Medicine that is inserted into the reservoir goes into the catheter and then into the vein.  How will I care for my incision site? Do not get the incision site wet. Bathe or shower as directed by your health care provider. How is my port accessed? Special steps must be taken to access the port:  Before the port is accessed, a numbing cream can be placed on the skin. This helps numb the skin over the port site.  Your health care provider uses a sterile technique to access the port. ? Your health care provider must put on a mask and sterile gloves. ? The skin over your port is cleaned carefully with an antiseptic and allowed to dry. ? The port is gently pinched between sterile gloves, and a needle is inserted into the port.  Only "non-coring" port needles should be used to access the port. Once the port is accessed, a blood return should be checked. This helps ensure that the port  is in the vein and is not clogged.  If your port needs to remain accessed for a constant infusion, a clear (transparent) bandage will be placed over the needle site. The bandage and needle will need to be changed every week, or as directed by your health care provider.  Keep the bandage covering the needle clean and dry. Do not get it wet. Follow your health care provider's instructions on how to take a shower or bath while the port is accessed.  If your port does not need to stay accessed, no bandage is needed over the port.  What is flushing? Flushing helps keep the port from getting clogged. Follow your health care provider's instructions on how and when to flush the port. Ports are usually flushed with saline solution or a medicine called heparin. The need for flushing will depend on how the port is used.  If the port is used for intermittent medicines or blood draws, the port will need to be flushed: ? After medicines have been given. ? After blood has been drawn. ? As part of routine maintenance.  If a constant infusion is running, the port may not need to be flushed.  How long will my port stay implanted? The port can stay in for as long as your health care provider thinks it is needed. When it is time for the port to come out, surgery will be   done to remove it. The procedure is similar to the one performed when the port was put in. When should I seek immediate medical care? When you have an implanted port, you should seek immediate medical care if:  You notice a bad smell coming from the incision site.  You have swelling, redness, or drainage at the incision site.  You have more swelling or pain at the port site or the surrounding area.  You have a fever that is not controlled with medicine.  This information is not intended to replace advice given to you by your health care provider. Make sure you discuss any questions you have with your health care provider. Document  Released: 02/09/2005 Document Revised: 07/18/2015 Document Reviewed: 10/17/2012 Elsevier Interactive Patient Education  2017 [employer]  

## 2017-09-13 NOTE — Progress Notes (Signed)
OK to treat with creatinine of 2.2 per Judson Roch, NP. dph

## 2017-09-15 ENCOUNTER — Other Ambulatory Visit: Payer: Self-pay | Admitting: Family

## 2017-09-15 DIAGNOSIS — C9000 Multiple myeloma not having achieved remission: Secondary | ICD-10-CM

## 2017-09-15 MED ORDER — DRONABINOL 2.5 MG PO CAPS: 2.5000 mg | ORAL_CAPSULE | Freq: Two times a day (BID) | ORAL | 1 refills | Status: DC

## 2017-09-20 ENCOUNTER — Inpatient Hospital Stay: Payer: Medicare Other

## 2017-09-20 ENCOUNTER — Other Ambulatory Visit: Payer: Self-pay

## 2017-09-20 ENCOUNTER — Inpatient Hospital Stay (HOSPITAL_BASED_OUTPATIENT_CLINIC_OR_DEPARTMENT_OTHER): Payer: Medicare Other | Admitting: Family

## 2017-09-20 ENCOUNTER — Encounter: Payer: Self-pay | Admitting: Family

## 2017-09-20 VITALS — BP 150/62 | HR 75 | Temp 98.6°F | Resp 18 | Wt 198.0 lb

## 2017-09-20 DIAGNOSIS — Z5112 Encounter for antineoplastic immunotherapy: Secondary | ICD-10-CM | POA: Diagnosis not present

## 2017-09-20 DIAGNOSIS — E291 Testicular hypofunction: Secondary | ICD-10-CM

## 2017-09-20 DIAGNOSIS — D631 Anemia in chronic kidney disease: Secondary | ICD-10-CM

## 2017-09-20 DIAGNOSIS — E349 Endocrine disorder, unspecified: Secondary | ICD-10-CM

## 2017-09-20 DIAGNOSIS — Z299 Encounter for prophylactic measures, unspecified: Secondary | ICD-10-CM

## 2017-09-20 DIAGNOSIS — C9000 Multiple myeloma not having achieved remission: Secondary | ICD-10-CM

## 2017-09-20 DIAGNOSIS — M25562 Pain in left knee: Secondary | ICD-10-CM

## 2017-09-20 DIAGNOSIS — D229 Melanocytic nevi, unspecified: Secondary | ICD-10-CM

## 2017-09-20 DIAGNOSIS — N189 Chronic kidney disease, unspecified: Secondary | ICD-10-CM | POA: Diagnosis not present

## 2017-09-20 DIAGNOSIS — D509 Iron deficiency anemia, unspecified: Secondary | ICD-10-CM | POA: Diagnosis not present

## 2017-09-20 DIAGNOSIS — G8929 Other chronic pain: Secondary | ICD-10-CM

## 2017-09-20 DIAGNOSIS — M4712 Other spondylosis with myelopathy, cervical region: Secondary | ICD-10-CM

## 2017-09-20 DIAGNOSIS — M25561 Pain in right knee: Secondary | ICD-10-CM

## 2017-09-20 LAB — CBC WITH DIFFERENTIAL (CANCER CENTER ONLY)
BASOS PCT: 0 %
Basophils Absolute: 0 10*3/uL (ref 0.0–0.1)
EOS PCT: 3 %
Eosinophils Absolute: 0.2 10*3/uL (ref 0.0–0.5)
HEMATOCRIT: 32.9 % — AB (ref 38.7–49.9)
HEMOGLOBIN: 10.7 g/dL — AB (ref 13.0–17.1)
LYMPHS PCT: 10 %
Lymphs Abs: 0.6 10*3/uL — ABNORMAL LOW (ref 0.9–3.3)
MCH: 26.6 pg — ABNORMAL LOW (ref 28.0–33.4)
MCHC: 32.5 g/dL (ref 32.0–35.9)
MCV: 81.8 fL — ABNORMAL LOW (ref 82.0–98.0)
Monocytes Absolute: 0.9 10*3/uL (ref 0.1–0.9)
Monocytes Relative: 14 %
NEUTROS ABS: 4.5 10*3/uL (ref 1.5–6.5)
Neutrophils Relative %: 73 %
Platelet Count: 143 10*3/uL — ABNORMAL LOW (ref 145–400)
RBC: 4.02 MIL/uL — AB (ref 4.20–5.70)
RDW: 19.5 % — AB (ref 11.1–15.7)
WBC Count: 6.3 10*3/uL (ref 4.0–10.0)

## 2017-09-20 LAB — CMP (CANCER CENTER ONLY)
ALT: 19 U/L (ref 10–47)
AST: 21 U/L (ref 11–38)
Albumin: 3.4 g/dL — ABNORMAL LOW (ref 3.5–5.0)
Alkaline Phosphatase: 69 U/L (ref 26–84)
Anion gap: 10 (ref 5–15)
BILIRUBIN TOTAL: 0.8 mg/dL (ref 0.2–1.6)
BUN: 24 mg/dL — AB (ref 7–22)
CALCIUM: 9.4 mg/dL (ref 8.0–10.3)
CO2: 31 mmol/L (ref 18–33)
CREATININE: 2.1 mg/dL — AB (ref 0.60–1.20)
Chloride: 102 mmol/L (ref 98–108)
GLUCOSE: 128 mg/dL — AB (ref 73–118)
Potassium: 4.4 mmol/L (ref 3.3–4.7)
SODIUM: 143 mmol/L (ref 128–145)
Total Protein: 6.9 g/dL (ref 6.4–8.1)

## 2017-09-20 MED ORDER — HYDROMORPHONE HCL 2 MG/ML IJ SOLN
INTRAMUSCULAR | Status: AC
  Filled 2017-09-20: qty 1

## 2017-09-20 MED ORDER — SODIUM CHLORIDE 0.9% FLUSH
10.0000 mL | INTRAVENOUS | Status: DC | PRN
  Administered 2017-09-20: 10 mL
  Filled 2017-09-20: qty 10

## 2017-09-20 MED ORDER — DEXAMETHASONE SODIUM PHOSPHATE 10 MG/ML IJ SOLN
INTRAMUSCULAR | Status: AC
  Filled 2017-09-20: qty 1

## 2017-09-20 MED ORDER — SODIUM CHLORIDE 0.9 % IV SOLN
Freq: Once | INTRAVENOUS | Status: AC
  Administered 2017-09-20: 09:00:00 via INTRAVENOUS
  Filled 2017-09-20: qty 250

## 2017-09-20 MED ORDER — PALONOSETRON HCL INJECTION 0.25 MG/5ML
0.2500 mg | Freq: Once | INTRAVENOUS | Status: AC
  Administered 2017-09-20: 0.25 mg via INTRAVENOUS

## 2017-09-20 MED ORDER — HEPARIN SOD (PORK) LOCK FLUSH 100 UNIT/ML IV SOLN
500.0000 [IU] | Freq: Once | INTRAVENOUS | Status: AC | PRN
  Administered 2017-09-20: 500 [IU]
  Filled 2017-09-20: qty 5

## 2017-09-20 MED ORDER — DEXAMETHASONE SODIUM PHOSPHATE 10 MG/ML IJ SOLN
10.0000 mg | Freq: Once | INTRAMUSCULAR | Status: AC
  Administered 2017-09-20: 10 mg via INTRAVENOUS

## 2017-09-20 MED ORDER — SODIUM CHLORIDE 0.9% FLUSH
3.0000 mL | INTRAVENOUS | Status: DC | PRN
  Filled 2017-09-20: qty 10

## 2017-09-20 MED ORDER — SODIUM CHLORIDE 0.9 % IV SOLN
Freq: Once | INTRAVENOUS | Status: AC
  Filled 2017-09-20: qty 250

## 2017-09-20 MED ORDER — HYDROMORPHONE HCL 2 MG/ML IJ SOLN
2.0000 mg | Freq: Once | INTRAMUSCULAR | Status: AC
Start: 2017-09-20 — End: 2017-09-20
  Administered 2017-09-20: 2 mg via INTRAVENOUS

## 2017-09-20 MED ORDER — CYCLOPHOSPHAMIDE CHEMO INJECTION 1 GM
300.0000 mg/m2 | Freq: Once | INTRAMUSCULAR | Status: AC
  Administered 2017-09-20: 620 mg via INTRAVENOUS
  Filled 2017-09-20: qty 31

## 2017-09-20 MED ORDER — PALONOSETRON HCL INJECTION 0.25 MG/5ML
INTRAVENOUS | Status: AC
  Filled 2017-09-20: qty 5

## 2017-09-20 MED ORDER — DEXTROSE 5 % IV SOLN
60.0000 mg | Freq: Once | INTRAVENOUS | Status: AC
  Administered 2017-09-20: 60 mg via INTRAVENOUS
  Filled 2017-09-20: qty 30

## 2017-09-20 NOTE — Patient Instructions (Signed)
Cyclophosphamide injection What is this medicine? CYCLOPHOSPHAMIDE (sye kloe FOSS fa mide) is a chemotherapy drug. It slows the growth of cancer cells. This medicine is used to treat many types of cancer like lymphoma, myeloma, leukemia, breast cancer, and ovarian cancer, to name a few. This medicine may be used for other purposes; ask your health care provider or pharmacist if you have questions. COMMON BRAND NAME(S): Cytoxan, Neosar What should I tell my health care provider before I take this medicine? They need to know if you have any of these conditions: -blood disorders -history of other chemotherapy -infection -kidney disease -liver disease -recent or ongoing radiation therapy -tumors in the bone marrow -an unusual or allergic reaction to cyclophosphamide, other chemotherapy, other medicines, foods, dyes, or preservatives -pregnant or trying to get pregnant -breast-feeding How should I use this medicine? This drug is usually given as an injection into a vein or muscle or by infusion into a vein. It is administered in a hospital or clinic by a specially trained health care professional. Talk to your pediatrician regarding the use of this medicine in children. Special care may be needed. Overdosage: If you think you have taken too much of this medicine contact a poison control center or emergency room at once. NOTE: This medicine is only for you. Do not share this medicine with others. What if I miss a dose? It is important not to miss your dose. Call your doctor or health care professional if you are unable to keep an appointment. What may interact with this medicine? This medicine may interact with the following medications: -amiodarone -amphotericin B -azathioprine -certain antiviral medicines for HIV or AIDS such as protease inhibitors (e.g., indinavir, ritonavir) and zidovudine -certain blood pressure medications such as benazepril, captopril, enalapril, fosinopril,  lisinopril, moexipril, monopril, perindopril, quinapril, ramipril, trandolapril -certain cancer medications such as anthracyclines (e.g., daunorubicin, doxorubicin), busulfan, cytarabine, paclitaxel, pentostatin, tamoxifen, trastuzumab -certain diuretics such as chlorothiazide, chlorthalidone, hydrochlorothiazide, indapamide, metolazone -certain medicines that treat or prevent blood clots like warfarin -certain muscle relaxants such as succinylcholine -cyclosporine -etanercept -indomethacin -medicines to increase blood counts like filgrastim, pegfilgrastim, sargramostim -medicines used as general anesthesia -metronidazole -natalizumab This list may not describe all possible interactions. Give your health care provider a list of all the medicines, herbs, non-prescription drugs, or dietary supplements you use. Also tell them if you smoke, drink alcohol, or use illegal drugs. Some items may interact with your medicine. What should I watch for while using this medicine? Visit your doctor for checks on your progress. This drug may make you feel generally unwell. This is not uncommon, as chemotherapy can affect healthy cells as well as cancer cells. Report any side effects. Continue your course of treatment even though you feel ill unless your doctor tells you to stop. Drink water or other fluids as directed. Urinate often, even at night. In some cases, you may be given additional medicines to help with side effects. Follow all directions for their use. Call your doctor or health care professional for advice if you get a fever, chills or sore throat, or other symptoms of a cold or flu. Do not treat yourself. This drug decreases your body's ability to fight infections. Try to avoid being around people who are sick. This medicine may increase your risk to bruise or bleed. Call your doctor or health care professional if you notice any unusual bleeding. Be careful brushing and flossing your teeth or using a  toothpick because you may get an infection or bleed   more easily. If you have any dental work done, tell your dentist you are receiving this medicine. You may get drowsy or dizzy. Do not drive, use machinery, or do anything that needs mental alertness until you know how this medicine affects you. Do not become pregnant while taking this medicine or for 1 year after stopping it. Women should inform their doctor if they wish to become pregnant or think they might be pregnant. Men should not father a child while taking this medicine and for 4 months after stopping it. There is a potential for serious side effects to an unborn child. Talk to your health care professional or pharmacist for more information. Do not breast-feed an infant while taking this medicine. This medicine may interfere with the ability to have a child. This medicine has caused ovarian failure in some women. This medicine has caused reduced sperm counts in some men. You should talk with your doctor or health care professional if you are concerned about your fertility. If you are going to have surgery, tell your doctor or health care professional that you have taken this medicine. What side effects may I notice from receiving this medicine? Side effects that you should report to your doctor or health care professional as soon as possible: -allergic reactions like skin rash, itching or hives, swelling of the face, lips, or tongue -low blood counts - this medicine may decrease the number of white blood cells, red blood cells and platelets. You may be at increased risk for infections and bleeding. -signs of infection - fever or chills, cough, sore throat, pain or difficulty passing urine -signs of decreased platelets or bleeding - bruising, pinpoint red spots on the skin, black, tarry stools, blood in the urine -signs of decreased red blood cells - unusually weak or tired, fainting spells, lightheadedness -breathing problems -dark  urine -dizziness -palpitations -swelling of the ankles, feet, hands -trouble passing urine or change in the amount of urine -weight gain -yellowing of the eyes or skin Side effects that usually do not require medical attention (report to your doctor or health care professional if they continue or are bothersome): -changes in nail or skin color -hair loss -missed menstrual periods -mouth sores -nausea, vomiting This list may not describe all possible side effects. Call your doctor for medical advice about side effects. You may report side effects to FDA at 1-800-FDA-1088. Where should I keep my medicine? This drug is given in a hospital or clinic and will not be stored at home. NOTE: This sheet is a summary. It may not cover all possible information. If you have questions about this medicine, talk to your doctor, pharmacist, or health care provider.  2018 Elsevier/Gold Standard (2011-12-25 16:22:58) Carfilzomib injection What is this medicine? CARFILZOMIB (kar FILZ oh mib) targets a specific protein within cancer cells and stops the cancer cells from growing. It is used to treat multiple myeloma. This medicine may be used for other purposes; ask your health care provider or pharmacist if you have questions. COMMON BRAND NAME(S): KYPROLIS What should I tell my health care provider before I take this medicine? They need to know if you have any of these conditions: -heart disease -history of blood clots -irregular heartbeat -kidney disease -liver disease -lung or breathing disease -an unusual or allergic reaction to carfilzomib, or other medicines, foods, dyes, or preservatives -pregnant or trying to get pregnant -breast-feeding How should I use this medicine? This medicine is for injection or infusion into a vein. It is given  by a health care professional in a hospital or clinic setting. Talk to your pediatrician regarding the use of this medicine in children. Special care may be  needed. Overdosage: If you think you have taken too much of this medicine contact a poison control center or emergency room at once. NOTE: This medicine is only for you. Do not share this medicine with others. What if I miss a dose? It is important not to miss your dose. Call your doctor or health care professional if you are unable to keep an appointment. What may interact with this medicine? Interactions are not expected. Give your health care provider a list of all the medicines, herbs, non-prescription drugs, or dietary supplements you use. Also tell them if you smoke, drink alcohol, or use illegal drugs. Some items may interact with your medicine. This list may not describe all possible interactions. Give your health care provider a list of all the medicines, herbs, non-prescription drugs, or dietary supplements you use. Also tell them if you smoke, drink alcohol, or use illegal drugs. Some items may interact with your medicine. What should I watch for while using this medicine? Your condition will be monitored carefully while you are receiving this medicine. Report any side effects. Continue your course of treatment even though you feel ill unless your doctor tells you to stop. You may need blood work done while you are taking this medicine. Do not become pregnant while taking this medicine or for at least 30 days after stopping it. Women should inform their doctor if they wish to become pregnant or think they might be pregnant. There is a potential for serious side effects to an unborn child. Men should not father a child while taking this medicine and for 90 days after stopping it. Talk to your health care professional or pharmacist for more information. Do not breast-feed an infant while taking this medicine. Check with your doctor or health care professional if you get an attack of severe diarrhea, nausea and vomiting, or if you sweat a lot. The loss of too much body fluid can make it  dangerous for you to take this medicine. You may get dizzy. Do not drive, use machinery, or do anything that needs mental alertness until you know how this medicine affects you. Do not stand or sit up quickly, especially if you are an older patient. This reduces the risk of dizzy or fainting spells. What side effects may I notice from receiving this medicine? Side effects that you should report to your doctor or health care professional as soon as possible: -allergic reactions like skin rash, itching or hives, swelling of the face, lips, or tongue -confusion -dizziness -feeling faint or lightheaded -fever or chills -palpitations -seizures -signs and symptoms of bleeding such as bloody or black, tarry stools; red or dark-brown urine; spitting up blood or brown material that looks like coffee grounds; red spots on the skin; unusual bruising or bleeding including from the eye, gums, or nose -signs and symptoms of a blood clot such as breathing problems; changes in vision; chest pain; severe, sudden headache; pain, swelling, warmth in the leg; trouble speaking; sudden numbness or weakness of the face, arm or leg -signs and symptoms of kidney injury like trouble passing urine or change in the amount of urine -signs and symptoms of liver injury like dark yellow or brown urine; general ill feeling or flu-like symptoms; light-colored stools; loss of appetite; nausea; right upper belly pain; unusually weak or tired; yellowing of the   eyes or skin Side effects that usually do not require medical attention (report to your doctor or health care professional if they continue or are bothersome): -back pain -cough -diarrhea -headache -muscle cramps -vomiting This list may not describe all possible side effects. Call your doctor for medical advice about side effects. You may report side effects to FDA at 1-800-FDA-1088. Where should I keep my medicine? This drug is given in a hospital or clinic and will not  be stored at home. NOTE: This sheet is a summary. It may not cover all possible information. If you have questions about this medicine, talk to your doctor, pharmacist, or health care provider.  2018 Elsevier/Gold Standard (2015-03-14 13:39:23)  

## 2017-09-20 NOTE — Patient Instructions (Signed)
Implanted Port Insertion, Care After °This sheet gives you information about how to care for yourself after your procedure. Your health care provider may also give you more specific instructions. If you have problems or questions, contact your health care provider. °What can I expect after the procedure? °After your procedure, it is common to have: °· Discomfort at the port insertion site. °· Bruising on the skin over the port. This should improve over 3-4 days. ° °Follow these instructions at home: °Port care °· After your port is placed, you will get a manufacturer's information card. The card has information about your port. Keep this card with you at all times. °· Take care of the port as told by your health care provider. Ask your health care provider if you or a family member can get training for taking care of the port at home. A home health care nurse may also take care of the port. °· Make sure to remember what type of port you have. °Incision care °· Follow instructions from your health care provider about how to take care of your port insertion site. Make sure you: °? Wash your hands with soap and water before you change your bandage (dressing). If soap and water are not available, use hand sanitizer. °? Change your dressing as told by your health care provider. °? Leave stitches (sutures), skin glue, or adhesive strips in place. These skin closures may need to stay in place for 2 weeks or longer. If adhesive strip edges start to loosen and curl up, you may trim the loose edges. Do not remove adhesive strips completely unless your health care provider tells you to do that. °· Check your port insertion site every day for signs of infection. Check for: °? More redness, swelling, or pain. °? More fluid or blood. °? Warmth. °? Pus or a bad smell. °General instructions °· Do not take baths, swim, or use a hot tub until your health care provider approves. °· Do not lift anything that is heavier than 10 lb (4.5  kg) for a week, or as told by your health care provider. °· Ask your health care provider when it is okay to: °? Return to work or school. °? Resume usual physical activities or sports. °· Do not drive for 24 hours if you were given a medicine to help you relax (sedative). °· Take over-the-counter and prescription medicines only as told by your health care provider. °· Wear a medical alert bracelet in case of an emergency. This will tell any health care providers that you have a port. °· Keep all follow-up visits as told by your health care provider. This is important. °Contact a health care provider if: °· You cannot flush your port with saline as directed, or you cannot draw blood from the port. °· You have a fever or chills. °· You have more redness, swelling, or pain around your port insertion site. °· You have more fluid or blood coming from your port insertion site. °· Your port insertion site feels warm to the touch. °· You have pus or a bad smell coming from the port insertion site. °Get help right away if: °· You have chest pain or shortness of breath. °· You have bleeding from your port that you cannot control. °Summary °· Take care of the port as told by your health care provider. °· Change your dressing as told by your health care provider. °· Keep all follow-up visits as told by your health care provider. °  This information is not intended to replace advice given to you by your health care provider. Make sure you discuss any questions you have with your health care provider. °Document Released: 11/30/2012 Document Revised: 01/01/2016 Document Reviewed: 01/01/2016 °Elsevier Interactive Patient Education © 2017 [employer] ° °

## 2017-09-20 NOTE — Progress Notes (Signed)
Hematology and Oncology Follow Up Visit  [name] [id number] [birth year] 67 y.o. 09/20/2017   Principle Diagnosis:  IgG kappa myeloma Anemia secondary to renal insufficiency Intermittent iron - deficiency anemia Hypotestosteronemia  Current Therapy:   Aredia 60 mg IV q 2 months  Aranesp 300 mcg subcu as needed for hemoglobin less than 11 DepoTestosterone 300 mg q 4 weeks IV iron as indicated Kyprolis/Cytoxan - s/p cycle8   Interim History:  [name] is here today for follow-up and treatment. He continues to have persistent knee pain that is effecting his mobility. Thankfully he has not had any falls.  He also has a 1 cm x 1 cm irregular mole that has enhanced significantly in size over the last few months. There is no open lesion.  2 weeks ago his M-spike was up slightly at 0.6 and kappa light chains 3.36 mg/dL.  He has had no issue with infections. No fever, chills, n/v, cough, rash, dizziness, chest pain, palpitations, abdominal pain or changes in bowel or bladder habits.  He has occasional episodes of SOB with over exertion and will take a break to rest as needed.  He has intermittent constipation and will take stool softeners as well as Carafate to help.  No swelling noted in his extremities at this time. Neuropathy in his hands and feet is unchanged.  No lymphadenopathy noted on exam.  No episodes of bleeding, no bruising or petechiae.  He has maintained a good appetite and is staying well hydrated. His weight is stable.   ECOG Performance Status: 2 - Symptomatic, <50% confined to bed  Medications:  Allergies as of 09/20/2017      Reactions   Iodinated Diagnostic Agents Rash   Patient states he was instructed not to take IV contrast.  In 2008 he had an unknown reaction, and was told not to take it again.  He was also told not to take it due to his kidneys.   Iodine Anxiety, Rash, Other (See Comments)   Didn't feel right "instructed not to take per MD--something  with his port" Didn't feel right Didn't feel right "instructed not to take per MD--something with his port"      Medication List        Accurate as of 09/20/17  8:49 AM. Always use your most recent med list.          amLODipine 10 MG tablet Commonly known as:  NORVASC Take 10 mg by mouth daily.   CARAFATE 1 GM/10ML suspension Generic drug:  sucralfate Take 1 g by mouth 4 (four) times daily.   cefdinir 300 MG capsule Commonly known as:  OMNICEF Take 1 capsule (300 mg total) by mouth 2 (two) times daily.   ciclopirox 8 % solution Commonly known as:  PENLAC APP QD. REMOVE WEEKLY WITH NAIL POLISH REMOVER THEN REAPPLY   CLEAR EYES ALL SEASONS 5-6 MG/ML Soln Generic drug:  Polyvinyl Alcohol-Povidone Place 2 drops into both eyes 3 (three) times daily as needed (drye eyes.).   dicyclomine 10 MG capsule Commonly known as:  BENTYL Take 1 capsule (10 mg total) by mouth 3 (three) times daily.   dronabinol 2.5 MG capsule Commonly known as:  MARINOL Take 1 capsule (2.5 mg total) by mouth 2 (two) times daily before a meal.   EMBEDA 20-0.8 MG Cpcr Generic drug:  Morphine-Naltrexone Take 1 capsule by mouth 3 (three) times daily.   EMBEDA 30-1.2 MG Cpcr Generic drug:  Morphine-Naltrexone Take 3 (three) times daily by mouth.  furosemide 40 MG tablet Commonly known as:  LASIX Take 40 mg by mouth.   glipiZIDE 5 MG tablet Commonly known as:  GLUCOTROL Take 5 mg by mouth 2 (two) times daily before a meal.   isosorbide mononitrate 30 MG 24 hr tablet Commonly known as:  IMDUR Take 30 mg by mouth daily.   latanoprost 0.005 % ophthalmic solution Commonly known as:  XALATAN Place 1 drop into the right eye every evening.   lidocaine-prilocaine cream Commonly known as:  EMLA Apply 1 application topically as needed.   linaclotide 145 MCG Caps capsule Commonly known as:  LINZESS Take 1 capsule (145 mcg total) by mouth daily before breakfast.   LORazepam 0.5 MG  tablet Commonly known as:  ATIVAN Take 1 tablet (0.5 mg total) by mouth every 6 (six) hours as needed (Nausea or vomiting).   losartan 25 MG tablet Commonly known as:  COZAAR Take 25 mg by mouth.   meclizine 50 MG tablet Commonly known as:  ANTIVERT Take 0.5 tablets (25 mg total) by mouth 4 (four) times daily as needed for dizziness.   methocarbamol 500 MG tablet Commonly known as:  ROBAXIN Take 500 mg by mouth 4 (four) times daily.   mirabegron ER 50 MG Tb24 tablet Commonly known as:  MYRBETRIQ Take 50 mg by mouth daily.   omeprazole 40 MG capsule Commonly known as:  PRILOSEC take 1 capsule by mouth twice a day   ondansetron 4 MG disintegrating tablet Commonly known as:  ZOFRAN ODT Take 1 tablet (4 mg total) by mouth every 8 (eight) hours as needed for nausea or vomiting.   ondansetron 8 MG tablet Commonly known as:  ZOFRAN Take 1 tablet (8 mg total) by mouth 2 (two) times daily as needed (Nausea or vomiting).   ondansetron 8 MG tablet Commonly known as:  ZOFRAN TAKE 1 TABLET(8 MG) BY MOUTH TWICE DAILY AS NEEDED FOR NAUSEA OR VOMITING   orphenadrine 100 MG tablet Commonly known as:  NORFLEX Take 1 tablet (100 mg total) by mouth 2 (two) times daily.   oxyCODONE 20 mg 12 hr tablet Commonly known as:  OXYCONTIN Take 1 tablet (20 mg total) by mouth every 12 (twelve) hours.   oxyCODONE-acetaminophen 10-325 MG tablet Commonly known as:  PERCOCET Take 1 tablet by mouth every 6 (six) hours as needed for pain.   potassium chloride SA 20 MEQ tablet Commonly known as:  K-DUR,KLOR-CON Take 1 tablet (20 mEq total) by mouth 2 (two) times daily.   prochlorperazine 10 MG tablet Commonly known as:  COMPAZINE take 1 tablet by mouth every 6 hours if needed for nausea and vomiting   TOPROL XL 50 MG 24 hr tablet Generic drug:  metoprolol succinate Take 50 mg by mouth daily.   traMADol 50 MG tablet Commonly known as:  ULTRAM Take 1 tablet (50 mg total) by mouth every 6 (six)  hours as needed.   traMADol 50 MG tablet Commonly known as:  ULTRAM TAKE 1 TABLET(50 MG) BY MOUTH EVERY 6 HOURS AS NEEDED   Vitamin D (Ergocalciferol) 50000 units Caps capsule Commonly known as:  DRISDOL Take 50,000 Units by mouth every Saturday.   ZIOPTAN 0.0015 % Soln Generic drug:  Tafluprost Place 1 drop into both eyes daily.       Allergies:  Allergies  Allergen Reactions  . Iodinated Diagnostic Agents Rash    Patient states he was instructed not to take IV contrast.  In 2008 he had an unknown reaction, and was told  not to take it again.  He was also told not to take it due to his kidneys.  . Iodine Anxiety, Rash and Other (See Comments)    Didn't feel right "instructed not to take per MD--something with his port" Didn't feel right Didn't feel right "instructed not to take per MD--something with his port"    Past Medical History, Surgical history, Social history, and Family History were reviewed and updated.  Review of Systems: All other 10 point review of systems is negative.   Physical Exam:  weight is 198 lb (89.8 kg). His oral temperature is 98.6 F (37 C). His blood pressure is 150/62 (abnormal) and his pulse is 75. His respiration is 18 and oxygen saturation is 100%.   Wt Readings from Last 3 Encounters:  09/20/17 198 lb (89.8 kg)  09/06/17 194 lb (88 kg)  08/09/17 191 lb (86.6 kg)    Ocular: Sclerae unicteric, pupils equal, round and reactive to light Ear-nose-throat: Oropharynx clear, dentition fair Lymphatic: No cervical, supraclavicular or axillary adenopathy Lungs no rales or rhonchi, good excursion bilaterally Heart regular rate and rhythm, no murmur appreciated Abd soft, nontender, positive bowel sounds, no liver or spleen tip palpated on exam, no fluid wave  MSK no focal spinal tenderness, no joint edema Neuro: non-focal, well-oriented, appropriate affect Breasts: Deferred   Lab Results  Component Value Date   WBC 6.3 09/20/2017   HGB 10.7  (L) 09/20/2017   HCT 32.9 (L) 09/20/2017   MCV 81.8 (L) 09/20/2017   PLT PENDING 09/20/2017   Lab Results  Component Value Date   FERRITIN 2,378 (H) 09/06/2017   IRON 91 09/06/2017   TIBC 225 09/06/2017   UIBC 134 09/06/2017   IRONPCTSAT 40 (L) 09/06/2017   Lab Results  Component Value Date   RETICCTPCT 0.8 09/06/2017   RBC 4.02 (L) 09/20/2017   RETICCTABS 27.5 03/21/2014   Lab Results  Component Value Date   KPAFRELGTCHN 33.6 (H) 09/06/2017   LAMBDASER 21.1 09/06/2017   KAPLAMBRATIO 1.59 09/06/2017   Lab Results  Component Value Date   IGGSERUM 1,002 09/06/2017   IGA 169 09/06/2017   IGMSERUM 27 09/06/2017   Lab Results  Component Value Date   TOTALPROTELP 6.6 09/06/2017   ALBUMINELP 3.4 09/06/2017   A1GS 0.2 09/06/2017   A2GS 1.1 (H) 09/06/2017   BETS 0.9 09/06/2017   BETA2SER 0.5 11/07/2014   GAMS 1.0 09/06/2017   MSPIKE 0.6 (H) 09/06/2017   SPEI Comment 05/31/2017     Chemistry      Component Value Date/Time   NA 141 09/13/2017 0810   NA 143 02/22/2017 0744   K 4.4 09/13/2017 0810   K 4.0 02/22/2017 0744   CL 102 09/13/2017 0810   CL 106 02/22/2017 0744   CO2 31 09/13/2017 0810   CO2 27 02/22/2017 0744   BUN 23 (H) 09/13/2017 0810   BUN 26 (H) 02/22/2017 0744   CREATININE 2.20 (H) 09/13/2017 0810   CREATININE 2.3 (H) 02/22/2017 0744      Component Value Date/Time   CALCIUM 9.2 09/13/2017 0810   CALCIUM 9.3 02/22/2017 0744   ALKPHOS 65 09/13/2017 0810   ALKPHOS 80 02/22/2017 0744   AST 19 09/13/2017 0810   ALT 24 09/13/2017 0810   ALT 22 02/22/2017 0744   BILITOT 0.6 09/13/2017 0810      Impression and Plan: Mr. Morss is a very pleasant 67 yo African American gentleman with IgG kappa myeloma. He is having issues with persistent bilateral knee  pain.  I have placed a referral with orthopedics as well as dermatology to evaluate the mole on his left forearm.  We will proceed with treatment today as planned.  Iron studies are pending. We will  replaced if needed.  He has his current treatment and appointment schedule.  He will contact our office with any questions or concerns. We can certainly see him sooner if need be.   Laverna Peace, NP 7/29/20198:49 AM

## 2017-09-20 NOTE — Progress Notes (Signed)
Ok to treat with elevated serum creatinine per Dr [name]. dph

## 2017-09-22 DIAGNOSIS — G8929 Other chronic pain: Secondary | ICD-10-CM | POA: Insufficient documentation

## 2017-09-22 DIAGNOSIS — M545 Low back pain, unspecified: Secondary | ICD-10-CM | POA: Insufficient documentation

## 2017-10-04 ENCOUNTER — Inpatient Hospital Stay: Payer: Medicare Other

## 2017-10-04 ENCOUNTER — Encounter: Payer: Self-pay | Admitting: Family

## 2017-10-04 ENCOUNTER — Inpatient Hospital Stay: Payer: Medicare Other | Attending: Hematology & Oncology | Admitting: Family

## 2017-10-04 ENCOUNTER — Other Ambulatory Visit: Payer: Self-pay

## 2017-10-04 VITALS — BP 144/65 | HR 80 | Temp 98.0°F | Resp 18 | Wt 196.0 lb

## 2017-10-04 DIAGNOSIS — E349 Endocrine disorder, unspecified: Secondary | ICD-10-CM

## 2017-10-04 DIAGNOSIS — Z299 Encounter for prophylactic measures, unspecified: Secondary | ICD-10-CM

## 2017-10-04 DIAGNOSIS — M1990 Unspecified osteoarthritis, unspecified site: Secondary | ICD-10-CM | POA: Diagnosis not present

## 2017-10-04 DIAGNOSIS — R109 Unspecified abdominal pain: Secondary | ICD-10-CM

## 2017-10-04 DIAGNOSIS — Z79899 Other long term (current) drug therapy: Secondary | ICD-10-CM

## 2017-10-04 DIAGNOSIS — E291 Testicular hypofunction: Secondary | ICD-10-CM | POA: Insufficient documentation

## 2017-10-04 DIAGNOSIS — D500 Iron deficiency anemia secondary to blood loss (chronic): Secondary | ICD-10-CM

## 2017-10-04 DIAGNOSIS — Z5111 Encounter for antineoplastic chemotherapy: Secondary | ICD-10-CM | POA: Diagnosis not present

## 2017-10-04 DIAGNOSIS — C9000 Multiple myeloma not having achieved remission: Secondary | ICD-10-CM | POA: Diagnosis present

## 2017-10-04 DIAGNOSIS — N189 Chronic kidney disease, unspecified: Principal | ICD-10-CM

## 2017-10-04 DIAGNOSIS — D631 Anemia in chronic kidney disease: Secondary | ICD-10-CM

## 2017-10-04 DIAGNOSIS — Z5112 Encounter for antineoplastic immunotherapy: Secondary | ICD-10-CM | POA: Insufficient documentation

## 2017-10-04 DIAGNOSIS — M4712 Other spondylosis with myelopathy, cervical region: Secondary | ICD-10-CM

## 2017-10-04 LAB — CBC WITH DIFFERENTIAL (CANCER CENTER ONLY)
Basophils Absolute: 0.1 10*3/uL (ref 0.0–0.1)
Basophils Relative: 1 %
Eosinophils Absolute: 0.2 10*3/uL (ref 0.0–0.5)
Eosinophils Relative: 4 %
HEMATOCRIT: 33.8 % — AB (ref 38.7–49.9)
Hemoglobin: 11.2 g/dL — ABNORMAL LOW (ref 13.0–17.1)
LYMPHS ABS: 0.9 10*3/uL (ref 0.9–3.3)
LYMPHS PCT: 15 %
MCH: 26.8 pg — AB (ref 28.0–33.4)
MCHC: 33.1 g/dL (ref 32.0–35.9)
MCV: 80.9 fL — AB (ref 82.0–98.0)
MONOS PCT: 13 %
Monocytes Absolute: 0.8 10*3/uL (ref 0.1–0.9)
NEUTROS ABS: 4 10*3/uL (ref 1.5–6.5)
NEUTROS PCT: 67 %
Platelet Count: 227 10*3/uL (ref 145–400)
RBC: 4.18 MIL/uL — ABNORMAL LOW (ref 4.20–5.70)
RDW: 16.4 % — ABNORMAL HIGH (ref 11.1–15.7)
WBC Count: 6 10*3/uL (ref 4.0–10.0)

## 2017-10-04 LAB — CMP (CANCER CENTER ONLY)
ALBUMIN: 3.6 g/dL (ref 3.5–5.0)
ALK PHOS: 78 U/L (ref 26–84)
ALT: 39 U/L (ref 10–47)
ANION GAP: 8 (ref 5–15)
AST: 24 U/L (ref 11–38)
BILIRUBIN TOTAL: 0.6 mg/dL (ref 0.2–1.6)
BUN: 41 mg/dL — ABNORMAL HIGH (ref 7–22)
CHLORIDE: 100 mmol/L (ref 98–108)
CO2: 30 mmol/L (ref 18–33)
CREATININE: 2.2 mg/dL — AB (ref 0.60–1.20)
Calcium: 9.1 mg/dL (ref 8.0–10.3)
Glucose, Bld: 186 mg/dL — ABNORMAL HIGH (ref 73–118)
POTASSIUM: 3.7 mmol/L (ref 3.3–4.7)
Sodium: 138 mmol/L (ref 128–145)
Total Protein: 7.1 g/dL (ref 6.4–8.1)

## 2017-10-04 LAB — IRON AND TIBC
Iron: 145 ug/dL (ref 42–163)
Saturation Ratios: 60 % (ref 42–163)
TIBC: 244 ug/dL (ref 202–409)
UIBC: 99 ug/dL

## 2017-10-04 LAB — RETICULOCYTES
RBC.: 4.26 MIL/uL (ref 4.20–5.82)
RETIC COUNT ABSOLUTE: 46.9 10*3/uL (ref 34.8–93.9)
Retic Ct Pct: 1.1 % (ref 0.8–1.8)

## 2017-10-04 LAB — FERRITIN: Ferritin: 1827 ng/mL — ABNORMAL HIGH (ref 24–336)

## 2017-10-04 MED ORDER — TESTOSTERONE CYPIONATE 200 MG/ML IM SOLN
INTRAMUSCULAR | Status: AC
  Filled 2017-10-04: qty 2

## 2017-10-04 MED ORDER — SODIUM CHLORIDE 0.9% FLUSH
10.0000 mL | INTRAVENOUS | Status: DC | PRN
  Administered 2017-10-04: 10 mL
  Filled 2017-10-04: qty 10

## 2017-10-04 MED ORDER — PALONOSETRON HCL INJECTION 0.25 MG/5ML
0.2500 mg | Freq: Once | INTRAVENOUS | Status: AC
  Administered 2017-10-04: 0.25 mg via INTRAVENOUS

## 2017-10-04 MED ORDER — SODIUM CHLORIDE 0.9 % IV SOLN
60.0000 mg | Freq: Once | INTRAVENOUS | Status: AC
  Administered 2017-10-04: 60 mg via INTRAVENOUS
  Filled 2017-10-04: qty 10

## 2017-10-04 MED ORDER — HYDROMORPHONE HCL 2 MG/ML IJ SOLN
INTRAMUSCULAR | Status: AC
  Filled 2017-10-04: qty 1

## 2017-10-04 MED ORDER — PALONOSETRON HCL INJECTION 0.25 MG/5ML
INTRAVENOUS | Status: AC
  Filled 2017-10-04: qty 5

## 2017-10-04 MED ORDER — SODIUM CHLORIDE 0.9 % IV SOLN
300.0000 mg/m2 | Freq: Once | INTRAVENOUS | Status: AC
  Administered 2017-10-04: 620 mg via INTRAVENOUS
  Filled 2017-10-04: qty 31

## 2017-10-04 MED ORDER — SODIUM CHLORIDE 0.9 % IV SOLN
Freq: Once | INTRAVENOUS | Status: AC
  Filled 2017-10-04: qty 250

## 2017-10-04 MED ORDER — DEXTROSE 5 % IV SOLN
29.0000 mg/m2 | Freq: Once | INTRAVENOUS | Status: AC
  Administered 2017-10-04: 60 mg via INTRAVENOUS
  Filled 2017-10-04: qty 30

## 2017-10-04 MED ORDER — HEPARIN SOD (PORK) LOCK FLUSH 100 UNIT/ML IV SOLN
500.0000 [IU] | Freq: Once | INTRAVENOUS | Status: AC | PRN
  Administered 2017-10-04: 500 [IU]
  Filled 2017-10-04: qty 5

## 2017-10-04 MED ORDER — DEXAMETHASONE SODIUM PHOSPHATE 10 MG/ML IJ SOLN
10.0000 mg | Freq: Once | INTRAMUSCULAR | Status: AC
  Administered 2017-10-04: 10 mg via INTRAVENOUS

## 2017-10-04 MED ORDER — HYDROMORPHONE HCL 2 MG/ML IJ SOLN
2.0000 mg | Freq: Once | INTRAMUSCULAR | Status: AC
  Administered 2017-10-04: 2 mg via INTRAVENOUS

## 2017-10-04 MED ORDER — DICYCLOMINE HCL 10 MG PO CAPS: 10.0000 mg | ORAL_CAPSULE | Freq: Three times a day (TID) | ORAL | 2 refills | Status: DC

## 2017-10-04 MED ORDER — TESTOSTERONE CYPIONATE 200 MG/ML IM SOLN
300.0000 mg | Freq: Once | INTRAMUSCULAR | Status: AC
  Administered 2017-10-04: 300 mg via INTRAMUSCULAR

## 2017-10-04 MED ORDER — SODIUM CHLORIDE 0.9 % IV SOLN
Freq: Once | INTRAVENOUS | Status: AC
  Administered 2017-10-04: 09:00:00 via INTRAVENOUS
  Filled 2017-10-04: qty 250

## 2017-10-04 MED ORDER — DEXAMETHASONE SODIUM PHOSPHATE 10 MG/ML IJ SOLN
INTRAMUSCULAR | Status: AC
  Filled 2017-10-04: qty 1

## 2017-10-04 NOTE — Patient Instructions (Signed)
Testosterone injection What is this medicine? TESTOSTERONE (tes TOS ter one) is the main male hormone. It supports normal male development such as muscle growth, facial hair, and deep voice. It is used in males to treat low testosterone levels. This medicine may be used for other purposes; ask your health care provider or pharmacist if you have questions. COMMON BRAND NAME(S): Andro-L.A., Aveed, Delatestryl, Depo-Testosterone, Virilon What should I tell my health care provider before I take this medicine? They need to know if you have any of these conditions: -cancer -diabetes -heart disease -kidney disease -liver disease -lung disease -prostate disease -an unusual or allergic reaction to testosterone, other medicines, foods, dyes, or preservatives -pregnant or trying to get pregnant -breast-feeding How should I use this medicine? This medicine is for injection into a muscle. It is usually given by a health care professional in a hospital or clinic setting. Contact your pediatrician regarding the use of this medicine in children. While this medicine may be prescribed for children as young as 69 years of age for selected conditions, precautions do apply. Overdosage: If you think you have taken too much of this medicine contact a poison control center or emergency room at once. NOTE: This medicine is only for you. Do not share this medicine with others. What if I miss a dose? Try not to miss a dose. Your doctor or health care professional will tell you when your next injection is due. Notify the office if you are unable to keep an appointment. What may interact with this medicine? -medicines for diabetes -medicines that treat or prevent blood clots like warfarin -oxyphenbutazone -propranolol -steroid medicines like prednisone or cortisone This list may not describe all possible interactions. Give your health care provider a list of all the medicines, herbs, non-prescription drugs, or  dietary supplements you use. Also tell them if you smoke, drink alcohol, or use illegal drugs. Some items may interact with your medicine. What should I watch for while using this medicine? Visit your doctor or health care professional for regular checks on your progress. They will need to check the level of testosterone in your blood. This medicine is only approved for use in men who have low levels of testosterone related to certain medical conditions. Heart attacks and strokes have been reported with the use of this medicine. Notify your doctor or health care professional and seek emergency treatment if you develop breathing problems; changes in vision; confusion; chest pain or chest tightness; sudden arm pain; severe, sudden headache; trouble speaking or understanding; sudden numbness or weakness of the face, arm or leg; loss of balance or coordination. Talk to your doctor about the risks and benefits of this medicine. This medicine may affect blood sugar levels. If you have diabetes, check with your doctor or health care professional before you change your diet or the dose of your diabetic medicine. Testosterone injections are not commonly used in women. Women should inform their doctor if they wish to become pregnant or think they might be pregnant. There is a potential for serious side effects to an unborn child. Talk to your health care professional or pharmacist for more information. Talk with your doctor or health care professional about your birth control options while taking this medicine. This drug is banned from use in athletes by most athletic organizations. What side effects may I notice from receiving this medicine? Side effects that you should report to your doctor or health care professional as soon as possible: -allergic reactions like skin rash,  itching or hives, swelling of the face, lips, or tongue -breast enlargement -breathing problems -changes in emotions or moods -deep or  hoarse voice -irregular menstrual periods -signs and symptoms of liver injury like dark yellow or brown urine; general ill feeling or flu-like symptoms; light-colored stools; loss of appetite; nausea; right upper belly pain; unusually weak or tired; yellowing of the eyes or skin -stomach pain -swelling of the ankles, feet, hands -too frequent or persistent erections -trouble passing urine or change in the amount of urine Side effects that usually do not require medical attention (report to your doctor or health care professional if they continue or are bothersome): -acne -change in sex drive or performance -facial hair growth -hair loss -headache This list may not describe all possible side effects. Call your doctor for medical advice about side effects. You may report side effects to FDA at 1-800-FDA-1088. Where should I keep my medicine? Keep out of the reach of children. This medicine can be abused. Keep your medicine in a safe place to protect it from theft. Do not share this medicine with anyone. Selling or giving away this medicine is dangerous and against the law. Store at room temperature between 20 and 25 degrees C (68 and 77 degrees F). Do not freeze. Protect from light. Follow the directions for the product you are prescribed. Throw away any unused medicine after the expiration date. NOTE: This sheet is a summary. It may not cover all possible information. If you have questions about this medicine, talk to your doctor, pharmacist, or health care provider.  2018 Else[employer]/Gold Standard (2015-03-16 07:33:55) Pamidronate injection What is this medicine? PAMIDRONATE (pa mi DROE nate) slows calcium loss from bones. It is used to treat high calcium blood levels from cancer or Paget's disease. It is also used to treat bone pain and prevent fractures from certain cancers that have spread to the bone. This medicine may be used for other purposes; ask your health care provider or pharmacist if  you have questions. COMMON BRAND NAME(S): Aredia What should I tell my health care provider before I take this medicine? They need to know if you have any of these conditions: -aspirin-sensitive asthma -dental disease -kidney disease -an unusual or allergic reaction to pamidronate, other medicines, foods, dyes, or preservatives -pregnant or trying to get pregnant -breast-feeding How should I use this medicine? This medicine is for infusion into a vein. It is given by a health care professional in a hospital or clinic setting. Talk to your pediatrician regarding the use of this medicine in children. This medicine is not approved for use in children. Overdosage: If you think you have taken too much of this medicine contact a poison control center or emergency room at once. NOTE: This medicine is only for you. Do not share this medicine with others. What if I miss a dose? This does not apply. What may interact with this medicine? -certain antibiotics given by injection -medicines for inflammation or pain like ibuprofen, naproxen -some diuretics like bumetanide, furosemide -cyclosporine -parathyroid hormone -tacrolimus -teriparatide -thalidomide This list may not describe all possible interactions. Give your health care provider a list of all the medicines, herbs, non-prescription drugs, or dietary supplements you use. Also tell them if you smoke, drink alcohol, or use illegal drugs. Some items may interact with your medicine. What should I watch for while using this medicine? Visit your doctor or health care professional for regular checkups. It may be some time before you see the benefit from this  medicine. Do not stop taking your medicine unless your doctor tells you to. Your doctor may order blood tests or other tests to see how you are doing. Women should inform their doctor if they wish to become pregnant or think they might be pregnant. There is a potential for serious side effects to  an unborn child. Talk to your health care professional or pharmacist for more information. You should make sure that you get enough calcium and vitamin D while you are taking this medicine. Discuss the foods you eat and the vitamins you take with your health care professional. Some people who take this medicine have severe bone, joint, and/or muscle pain. This medicine may also increase your risk for a broken thigh bone. Tell your doctor right away if you have pain in your upper leg or groin. Tell your doctor if you have any pain that does not go away or that gets worse. What side effects may I notice from receiving this medicine? Side effects that you should report to your doctor or health care professional as soon as possible: -allergic reactions like skin rash, itching or hives, swelling of the face, lips, or tongue -black or tarry stools -changes in vision -eye inflammation, pain -high blood pressure -jaw pain, especially burning or cramping -muscle weakness -numb, tingling pain -swelling of feet or hands -trouble passing urine or change in the amount of urine -unable to move easily Side effects that usually do not require medical attention (report to your doctor or health care professional if they continue or are bothersome): -bone, joint, or muscle pain -constipation -dizzy, drowsy -fever -headache -loss of appetite -nausea, vomiting -pain at site where injected This list may not describe all possible side effects. Call your doctor for medical advice about side effects. You may report side effects to FDA at 1-800-FDA-1088. Where should I keep my medicine? This drug is given in a hospital or clinic and will not be stored at home. NOTE: This sheet is a summary. It may not cover all possible information. If you have questions about this medicine, talk to your doctor, pharmacist, or health care provider.  2018 [employer]/Gold Standard (2010-08-08 08:49:49) Cyclophosphamide  injection What is this medicine? CYCLOPHOSPHAMIDE (sye kloe FOSS fa mide) is a chemotherapy drug. It slows the growth of cancer cells. This medicine is used to treat many types of cancer like lymphoma, myeloma, leukemia, breast cancer, and ovarian cancer, to name a few. This medicine may be used for other purposes; ask your health care provider or pharmacist if you have questions. COMMON BRAND NAME(S): Cytoxan, Neosar What should I tell my health care provider before I take this medicine? They need to know if you have any of these conditions: -blood disorders -history of other chemotherapy -infection -kidney disease -liver disease -recent or ongoing radiation therapy -tumors in the bone marrow -an unusual or allergic reaction to cyclophosphamide, other chemotherapy, other medicines, foods, dyes, or preservatives -pregnant or trying to get pregnant -breast-feeding How should I use this medicine? This drug is usually given as an injection into a vein or muscle or by infusion into a vein. It is administered in a hospital or clinic by a specially trained health care professional. Talk to your pediatrician regarding the use of this medicine in children. Special care may be needed. Overdosage: If you think you have taken too much of this medicine contact a poison control center or emergency room at once. NOTE: This medicine is only for you. Do not share this medicine  with others. What if I miss a dose? It is important not to miss your dose. Call your doctor or health care professional if you are unable to keep an appointment. What may interact with this medicine? This medicine may interact with the following medications: -amiodarone -amphotericin B -azathioprine -certain antiviral medicines for HIV or AIDS such as protease inhibitors (e.g., indinavir, ritonavir) and zidovudine -certain blood pressure medications such as benazepril, captopril, enalapril, fosinopril, lisinopril, moexipril,  monopril, perindopril, quinapril, ramipril, trandolapril -certain cancer medications such as anthracyclines (e.g., daunorubicin, doxorubicin), busulfan, cytarabine, paclitaxel, pentostatin, tamoxifen, trastuzumab -certain diuretics such as chlorothiazide, chlorthalidone, hydrochlorothiazide, indapamide, metolazone -certain medicines that treat or prevent blood clots like warfarin -certain muscle relaxants such as succinylcholine -cyclosporine -etanercept -indomethacin -medicines to increase blood counts like filgrastim, pegfilgrastim, sargramostim -medicines used as general anesthesia -metronidazole -natalizumab This list may not describe all possible interactions. Give your health care provider a list of all the medicines, herbs, non-prescription drugs, or dietary supplements you use. Also tell them if you smoke, drink alcohol, or use illegal drugs. Some items may interact with your medicine. What should I watch for while using this medicine? Visit your doctor for checks on your progress. This drug may make you feel generally unwell. This is not uncommon, as chemotherapy can affect healthy cells as well as cancer cells. Report any side effects. Continue your course of treatment even though you feel ill unless your doctor tells you to stop. Drink water or other fluids as directed. Urinate often, even at night. In some cases, you may be given additional medicines to help with side effects. Follow all directions for their use. Call your doctor or health care professional for advice if you get a fever, chills or sore throat, or other symptoms of a cold or flu. Do not treat yourself. This drug decreases your body's ability to fight infections. Try to avoid being around people who are sick. This medicine may increase your risk to bruise or bleed. Call your doctor or health care professional if you notice any unusual bleeding. Be careful brushing and flossing your teeth or using a toothpick because you  may get an infection or bleed more easily. If you have any dental work done, tell your dentist you are receiving this medicine. You may get drowsy or dizzy. Do not drive, use machinery, or do anything that needs mental alertness until you know how this medicine affects you. Do not become pregnant while taking this medicine or for 1 year after stopping it. Women should inform their doctor if they wish to become pregnant or think they might be pregnant. Men should not father a child while taking this medicine and for 4 months after stopping it. There is a potential for serious side effects to an unborn child. Talk to your health care professional or pharmacist for more information. Do not breast-feed an infant while taking this medicine. This medicine may interfere with the ability to have a child. This medicine has caused ovarian failure in some women. This medicine has caused reduced sperm counts in some men. You should talk with your doctor or health care professional if you are concerned about your fertility. If you are going to have surgery, tell your doctor or health care professional that you have taken this medicine. What side effects may I notice from receiving this medicine? Side effects that you should report to your doctor or health care professional as soon as possible: -allergic reactions like skin rash, itching or hives, swelling of  the face, lips, or tongue -low blood counts - this medicine may decrease the number of white blood cells, red blood cells and platelets. You may be at increased risk for infections and bleeding. -signs of infection - fever or chills, cough, sore throat, pain or difficulty passing urine -signs of decreased platelets or bleeding - bruising, pinpoint red spots on the skin, black, tarry stools, blood in the urine -signs of decreased red blood cells - unusually weak or tired, fainting spells, lightheadedness -breathing problems -dark  urine -dizziness -palpitations -swelling of the ankles, feet, hands -trouble passing urine or change in the amount of urine -weight gain -yellowing of the eyes or skin Side effects that usually do not require medical attention (report to your doctor or health care professional if they continue or are bothersome): -changes in nail or skin color -hair loss -missed menstrual periods -mouth sores -nausea, vomiting This list may not describe all possible side effects. Call your doctor for medical advice about side effects. You may report side effects to FDA at 1-800-FDA-1088. Where should I keep my medicine? This drug is given in a hospital or clinic and will not be stored at home. NOTE: This sheet is a summary. It may not cover all possible information. If you have questions about this medicine, talk to your doctor, pharmacist, or health care provider.  2018 [employer]/Gold Standard (2011-12-25 16:22:58) Carfilzomib injection What is this medicine? CARFILZOMIB (kar FILZ oh mib) targets a specific protein within cancer cells and stops the cancer cells from growing. It is used to treat multiple myeloma. This medicine may be used for other purposes; ask your health care provider or pharmacist if you have questions. COMMON BRAND NAME(S): KYPROLIS What should I tell my health care provider before I take this medicine? They need to know if you have any of these conditions: -heart disease -history of blood clots -irregular heartbeat -kidney disease -liver disease -lung or breathing disease -an unusual or allergic reaction to carfilzomib, or other medicines, foods, dyes, or preservatives -pregnant or trying to get pregnant -breast-feeding How should I use this medicine? This medicine is for injection or infusion into a vein. It is given by a health care professional in a hospital or clinic setting. Talk to your pediatrician regarding the use of this medicine in children. Special care may be  needed. Overdosage: If you think you have taken too much of this medicine contact a poison control center or emergency room at once. NOTE: This medicine is only for you. Do not share this medicine with others. What if I miss a dose? It is important not to miss your dose. Call your doctor or health care professional if you are unable to keep an appointment. What may interact with this medicine? Interactions are not expected. Give your health care provider a list of all the medicines, herbs, non-prescription drugs, or dietary supplements you use. Also tell them if you smoke, drink alcohol, or use illegal drugs. Some items may interact with your medicine. This list may not describe all possible interactions. Give your health care provider a list of all the medicines, herbs, non-prescription drugs, or dietary supplements you use. Also tell them if you smoke, drink alcohol, or use illegal drugs. Some items may interact with your medicine. What should I watch for while using this medicine? Your condition will be monitored carefully while you are receiving this medicine. Report any side effects. Continue your course of treatment even though you feel ill unless your doctor tells you  to stop. You may need blood work done while you are taking this medicine. Do not become pregnant while taking this medicine or for at least 30 days after stopping it. Women should inform their doctor if they wish to become pregnant or think they might be pregnant. There is a potential for serious side effects to an unborn child. Men should not father a child while taking this medicine and for 90 days after stopping it. Talk to your health care professional or pharmacist for more information. Do not breast-feed an infant while taking this medicine. Check with your doctor or health care professional if you get an attack of severe diarrhea, nausea and vomiting, or if you sweat a lot. The loss of too much body fluid can make it  dangerous for you to take this medicine. You may get dizzy. Do not drive, use machinery, or do anything that needs mental alertness until you know how this medicine affects you. Do not stand or sit up quickly, especially if you are an older patient. This reduces the risk of dizzy or fainting spells. What side effects may I notice from receiving this medicine? Side effects that you should report to your doctor or health care professional as soon as possible: -allergic reactions like skin rash, itching or hives, swelling of the face, lips, or tongue -confusion -dizziness -feeling faint or lightheaded -fever or chills -palpitations -seizures -signs and symptoms of bleeding such as bloody or black, tarry stools; red or dark-brown urine; spitting up blood or brown material that looks like coffee grounds; red spots on the skin; unusual bruising or bleeding including from the eye, gums, or nose -signs and symptoms of a blood clot such as breathing problems; changes in vision; chest pain; severe, sudden headache; pain, swelling, warmth in the leg; trouble speaking; sudden numbness or weakness of the face, arm or leg -signs and symptoms of kidney injury like trouble passing urine or change in the amount of urine -signs and symptoms of liver injury like dark yellow or brown urine; general ill feeling or flu-like symptoms; light-colored stools; loss of appetite; nausea; right upper belly pain; unusually weak or tired; yellowing of the eyes or skin Side effects that usually do not require medical attention (report to your doctor or health care professional if they continue or are bothersome): -back pain -cough -diarrhea -headache -muscle cramps -vomiting This list may not describe all possible side effects. Call your doctor for medical advice about side effects. You may report side effects to FDA at 1-800-FDA-1088. Where should I keep my medicine? This drug is given in a hospital or clinic and will not  be stored at home. NOTE: This sheet is a summary. It may not cover all possible information. If you have questions about this medicine, talk to your doctor, pharmacist, or health care provider.  2018 [employer] Standard (2015-03-14 13:39:23)

## 2017-10-04 NOTE — Progress Notes (Signed)
Hematology and Oncology Follow Up Visit  [name] [id number] April 05, 1950 67 y.o. 10/04/2017   Principle Diagnosis:  IgG kappa myeloma Anemia secondary to renal insufficiency Intermittent iron - deficiency anemia Hypotestosteronemia  Current Therapy:   Aredia 60 mg IV q 2 months  Aranesp 300 mcg subcu as needed for hemoglobin less than 11 DepoTestosterone 300 mg q 4 weeks IV iron as indicated Kyprolis/Cytoxan - s/p cycle9   Interim History:  [name] is here today for follow-up. He is doing fairly well but still having some fatigue. He is due for testosterone this visit. Iron studies are pending.  M-spike last month was 0.6, IgG level 974 mg/dL and kappa light chains 3.36 mg/dL.  No fever, chills, cough, rash, dizziness, chest pain, palpitations, or changes in bowel or bladder habits.  SOB with over exertion is unchanged and he rests as needed.  He has occasional n/v due to GERD. He also has abdominal pain at times and states that he takes Bentyl for this but has run out and needs a refill.  He was able to see ortho and received steroid injections in both knees on 8/9. He states that this has helped quite a bit already.  No falls or syncopal episodes to report.  No swelling in his extremities at this time. The neuropathy in his hands and feet is unchanged.  No lymphadenopathy noted on exam.  His appetite comes and goes but he is staying well hydrated. His weight is down 2 lbs since his last visit.   ECOG Performance Status: 1 - Symptomatic but completely ambulatory  Medications:  Allergies as of 10/04/2017      Reactions   Iodinated Diagnostic Agents Rash   Patient states he was instructed not to take IV contrast.  In 2008 he had an unknown reaction, and was told not to take it again.  He was also told not to take it due to his kidneys.   Iodine Anxiety, Rash, Other (See Comments)   Didn't feel right "instructed not to take per MD--something with his port" Didn't feel  right Didn't feel right "instructed not to take per MD--something with his port"      Medication List        Accurate as of 10/04/17  8:58 AM. Always use your most recent med list.          amLODipine 10 MG tablet Commonly known as:  NORVASC Take 10 mg by mouth daily.   CARAFATE 1 GM/10ML suspension Generic drug:  sucralfate Take 1 g by mouth 4 (four) times daily.   cefdinir 300 MG capsule Commonly known as:  OMNICEF Take 1 capsule (300 mg total) by mouth 2 (two) times daily.   ciclopirox 8 % solution Commonly known as:  PENLAC APP QD. REMOVE WEEKLY WITH NAIL POLISH REMOVER THEN REAPPLY   CLEAR EYES ALL SEASONS 5-6 MG/ML Soln Generic drug:  Polyvinyl Alcohol-Povidone Place 2 drops into both eyes 3 (three) times daily as needed (drye eyes.).   dicyclomine 10 MG capsule Commonly known as:  BENTYL Take 1 capsule (10 mg total) by mouth 3 (three) times daily.   dronabinol 2.5 MG capsule Commonly known as:  MARINOL Take 1 capsule (2.5 mg total) by mouth 2 (two) times daily before a meal.   EMBEDA 20-0.8 MG Cpcr Generic drug:  Morphine-Naltrexone Take 1 capsule by mouth 3 (three) times daily.   EMBEDA 30-1.2 MG Cpcr Generic drug:  Morphine-Naltrexone Take 3 (three) times daily by mouth.   furosemide  40 MG tablet Commonly known as:  LASIX Take 40 mg by mouth.   glipiZIDE 5 MG tablet Commonly known as:  GLUCOTROL Take 5 mg by mouth 2 (two) times daily before a meal.   isosorbide mononitrate 30 MG 24 hr tablet Commonly known as:  IMDUR Take 30 mg by mouth daily.   latanoprost 0.005 % ophthalmic solution Commonly known as:  XALATAN Place 1 drop into the right eye every evening.   lidocaine-prilocaine cream Commonly known as:  EMLA Apply 1 application topically as needed.   linaclotide 145 MCG Caps capsule Commonly known as:  LINZESS Take 1 capsule (145 mcg total) by mouth daily before breakfast.   LORazepam 0.5 MG tablet Commonly known as:  ATIVAN Take 1  tablet (0.5 mg total) by mouth every 6 (six) hours as needed (Nausea or vomiting).   losartan 25 MG tablet Commonly known as:  COZAAR Take 25 mg by mouth.   meclizine 50 MG tablet Commonly known as:  ANTIVERT Take 0.5 tablets (25 mg total) by mouth 4 (four) times daily as needed for dizziness.   methocarbamol 500 MG tablet Commonly known as:  ROBAXIN Take 500 mg by mouth 4 (four) times daily.   mirabegron ER 50 MG Tb24 tablet Commonly known as:  MYRBETRIQ Take 50 mg by mouth daily.   omeprazole 40 MG capsule Commonly known as:  PRILOSEC take 1 capsule by mouth twice a day   ondansetron 4 MG disintegrating tablet Commonly known as:  ZOFRAN-ODT Take 1 tablet (4 mg total) by mouth every 8 (eight) hours as needed for nausea or vomiting.   ondansetron 8 MG tablet Commonly known as:  ZOFRAN Take 1 tablet (8 mg total) by mouth 2 (two) times daily as needed (Nausea or vomiting).   ondansetron 8 MG tablet Commonly known as:  ZOFRAN TAKE 1 TABLET(8 MG) BY MOUTH TWICE DAILY AS NEEDED FOR NAUSEA OR VOMITING   orphenadrine 100 MG tablet Commonly known as:  NORFLEX Take 1 tablet (100 mg total) by mouth 2 (two) times daily.   oxyCODONE 20 mg 12 hr tablet Commonly known as:  OXYCONTIN Take 1 tablet (20 mg total) by mouth every 12 (twelve) hours.   oxyCODONE-acetaminophen 10-325 MG tablet Commonly known as:  PERCOCET Take 1 tablet by mouth every 6 (six) hours as needed for pain.   potassium chloride SA 20 MEQ tablet Commonly known as:  K-DUR,KLOR-CON Take 1 tablet (20 mEq total) by mouth 2 (two) times daily.   prochlorperazine 10 MG tablet Commonly known as:  COMPAZINE take 1 tablet by mouth every 6 hours if needed for nausea and vomiting   TOPROL XL 50 MG 24 hr tablet Generic drug:  metoprolol succinate Take 50 mg by mouth daily.   traMADol 50 MG tablet Commonly known as:  ULTRAM Take 1 tablet (50 mg total) by mouth every 6 (six) hours as needed.   traMADol 50 MG  tablet Commonly known as:  ULTRAM TAKE 1 TABLET(50 MG) BY MOUTH EVERY 6 HOURS AS NEEDED   Vitamin D (Ergocalciferol) 50000 units Caps capsule Commonly known as:  DRISDOL Take 50,000 Units by mouth every Saturday.   ZIOPTAN 0.0015 % Soln Generic drug:  Tafluprost Place 1 drop into both eyes daily.       Allergies:  Allergies  Allergen Reactions  . Iodinated Diagnostic Agents Rash    Patient states he was instructed not to take IV contrast.  In 2008 he had an unknown reaction, and was told not to  take it again.  He was also told not to take it due to his kidneys.  . Iodine Anxiety, Rash and Other (See Comments)    Didn't feel right "instructed not to take per MD--something with his port" Didn't feel right Didn't feel right "instructed not to take per MD--something with his port"    Past Medical History, Surgical history, Social history, and Family History were reviewed and updated.  Review of Systems: All other 10 point review of systems is negative.   Physical Exam:  weight is 196 lb (88.9 kg). His oral temperature is 98 F (36.7 C). His blood pressure is 144/65 (abnormal) and his pulse is 80. His respiration is 18 and oxygen saturation is 100%.   Wt Readings from Last 3 Encounters:  10/04/17 196 lb (88.9 kg)  09/20/17 198 lb (89.8 kg)  09/06/17 194 lb (88 kg)    Ocular: Sclerae unicteric, pupils equal, round and reactive to light Ear-nose-throat: Oropharynx clear, dentition fair Lymphatic: No cervical, supraclavicular or axillary adenopathy Lungs no rales or rhonchi, good excursion bilaterally Heart regular rate and rhythm, no murmur appreciated Abd soft, nontender, positive bowel sounds, no liver or spleen tip palpated on exam, no fluid wave  MSK no focal spinal tenderness, no joint edema Neuro: non-focal, well-oriented, appropriate affect Breasts: Deferred   Lab Results  Component Value Date   WBC 6.0 10/04/2017   HGB 11.2 (L) 10/04/2017   HCT 33.8 (L)  10/04/2017   MCV 80.9 (L) 10/04/2017   PLT 227 10/04/2017   Lab Results  Component Value Date   FERRITIN 2,378 (H) 09/06/2017   IRON 91 09/06/2017   TIBC 225 09/06/2017   UIBC 134 09/06/2017   IRONPCTSAT 40 (L) 09/06/2017   Lab Results  Component Value Date   RETICCTPCT 0.8 09/06/2017   RBC 4.18 (L) 10/04/2017   RETICCTABS 27.5 03/21/2014   Lab Results  Component Value Date   KPAFRELGTCHN 33.6 (H) 09/06/2017   LAMBDASER 21.1 09/06/2017   KAPLAMBRATIO 1.59 09/06/2017   Lab Results  Component Value Date   IGGSERUM 1,002 09/06/2017   IGA 169 09/06/2017   IGMSERUM 27 09/06/2017   Lab Results  Component Value Date   TOTALPROTELP 6.6 09/06/2017   ALBUMINELP 3.4 09/06/2017   A1GS 0.2 09/06/2017   A2GS 1.1 (H) 09/06/2017   BETS 0.9 09/06/2017   BETA2SER 0.5 11/07/2014   GAMS 1.0 09/06/2017   MSPIKE 0.6 (H) 09/06/2017   SPEI Comment 05/31/2017     Chemistry      Component Value Date/Time   NA 138 10/04/2017 0755   NA 143 02/22/2017 0744   K 3.7 10/04/2017 0755   K 4.0 02/22/2017 0744   CL 100 10/04/2017 0755   CL 106 02/22/2017 0744   CO2 30 10/04/2017 0755   CO2 27 02/22/2017 0744   BUN 41 (H) 10/04/2017 0755   BUN 26 (H) 02/22/2017 0744   CREATININE 2.20 (H) 10/04/2017 0755   CREATININE 2.3 (H) 02/22/2017 0744      Component Value Date/Time   CALCIUM 9.1 10/04/2017 0755   CALCIUM 9.3 02/22/2017 0744   ALKPHOS 78 10/04/2017 0755   ALKPHOS 80 02/22/2017 0744   AST 24 10/04/2017 0755   ALT 39 10/04/2017 0755   ALT 22 02/22/2017 0744   BILITOT 0.6 10/04/2017 0755      Impression and Plan: Mr. Hasler is avery pleasant 67 yo African American gentleman with IgG kappa myeloma. He is tolerating treatment nicely. His M-spike and kappa light chains  have both increased slightly over the last 8 weeks so we will continue to watch these closely.  We will proceed with treatment today as planned.  Bentyl was refilled. 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  Iron studies are  pending. He did receive testosterone today as planned.  Hgb 11.2 so Aranesp not needed this visit.  We will plan to see him back in another week for MD follow-up.  He will contact our office with any questions or concerns. We can certainly see him sooner if need be.    Laverna Peace, NP 8/12/20198:58 AM

## 2017-10-04 NOTE — Progress Notes (Signed)
OK to treat with creatinine of 2 .2 per Dr [name]. dph

## 2017-10-05 ENCOUNTER — Other Ambulatory Visit: Payer: Self-pay | Admitting: Family

## 2017-10-05 LAB — PROTEIN ELECTROPHORESIS, SERUM
A/G Ratio: 1.2 (ref 0.7–1.7)
ALBUMIN ELP: 3.8 g/dL (ref 2.9–4.4)
Alpha-1-Globulin: 0.2 g/dL (ref 0.0–0.4)
Alpha-2-Globulin: 1.1 g/dL — ABNORMAL HIGH (ref 0.4–1.0)
BETA GLOBULIN: 1 g/dL (ref 0.7–1.3)
GAMMA GLOBULIN: 1 g/dL (ref 0.4–1.8)
Globulin, Total: 3.3 g/dL (ref 2.2–3.9)
M-SPIKE, %: 0.3 g/dL — AB
TOTAL PROTEIN ELP: 7.1 g/dL (ref 6.0–8.5)

## 2017-10-05 LAB — KAPPA/LAMBDA LIGHT CHAINS
KAPPA FREE LGHT CHN: 25 mg/L — AB (ref 3.3–19.4)
Kappa, lambda light chain ratio: 1.64 (ref 0.26–1.65)
Lambda free light chains: 15.2 mg/L (ref 5.7–26.3)

## 2017-10-05 LAB — IGG, IGA, IGM
IGG (IMMUNOGLOBIN G), SERUM: 957 mg/dL (ref 700–1600)
IgA: 161 mg/dL (ref 61–437)
IgM (Immunoglobulin M), Srm: 23 mg/dL (ref 20–172)

## 2017-10-05 LAB — TESTOSTERONE: TESTOSTERONE: 96 ng/dL — AB (ref 264–916)

## 2017-10-11 ENCOUNTER — Other Ambulatory Visit: Payer: Self-pay

## 2017-10-11 ENCOUNTER — Inpatient Hospital Stay: Payer: Medicare Other

## 2017-10-11 ENCOUNTER — Encounter: Payer: Self-pay | Admitting: Hematology & Oncology

## 2017-10-11 ENCOUNTER — Inpatient Hospital Stay (HOSPITAL_BASED_OUTPATIENT_CLINIC_OR_DEPARTMENT_OTHER): Payer: Medicare Other | Admitting: Hematology & Oncology

## 2017-10-11 VITALS — BP 130/68 | HR 103 | Temp 98.3°F | Resp 18 | Wt 200.0 lb

## 2017-10-11 DIAGNOSIS — D631 Anemia in chronic kidney disease: Secondary | ICD-10-CM

## 2017-10-11 DIAGNOSIS — E291 Testicular hypofunction: Secondary | ICD-10-CM | POA: Diagnosis not present

## 2017-10-11 DIAGNOSIS — D500 Iron deficiency anemia secondary to blood loss (chronic): Secondary | ICD-10-CM

## 2017-10-11 DIAGNOSIS — C9000 Multiple myeloma not having achieved remission: Secondary | ICD-10-CM | POA: Diagnosis not present

## 2017-10-11 DIAGNOSIS — Z299 Encounter for prophylactic measures, unspecified: Secondary | ICD-10-CM

## 2017-10-11 DIAGNOSIS — M4712 Other spondylosis with myelopathy, cervical region: Secondary | ICD-10-CM

## 2017-10-11 DIAGNOSIS — N189 Chronic kidney disease, unspecified: Secondary | ICD-10-CM

## 2017-10-11 DIAGNOSIS — Z79899 Other long term (current) drug therapy: Secondary | ICD-10-CM | POA: Diagnosis not present

## 2017-10-11 DIAGNOSIS — Z5112 Encounter for antineoplastic immunotherapy: Secondary | ICD-10-CM | POA: Diagnosis not present

## 2017-10-11 DIAGNOSIS — E349 Endocrine disorder, unspecified: Secondary | ICD-10-CM

## 2017-10-11 LAB — CMP (CANCER CENTER ONLY)
ALK PHOS: 71 U/L (ref 26–84)
ALT: 26 U/L (ref 10–47)
AST: 20 U/L (ref 11–38)
Albumin: 3.3 g/dL — ABNORMAL LOW (ref 3.5–5.0)
Anion gap: 5 (ref 5–15)
BUN: 26 mg/dL — ABNORMAL HIGH (ref 7–22)
CALCIUM: 8.4 mg/dL (ref 8.0–10.3)
CO2: 30 mmol/L (ref 18–33)
Chloride: 106 mmol/L (ref 98–108)
Creatinine: 2.5 mg/dL — ABNORMAL HIGH (ref 0.60–1.20)
Glucose, Bld: 125 mg/dL — ABNORMAL HIGH (ref 73–118)
POTASSIUM: 4 mmol/L (ref 3.3–4.7)
Sodium: 141 mmol/L (ref 128–145)
TOTAL PROTEIN: 6.6 g/dL (ref 6.4–8.1)
Total Bilirubin: 0.4 mg/dL (ref 0.2–1.6)

## 2017-10-11 LAB — CBC WITH DIFFERENTIAL (CANCER CENTER ONLY)
BASOS ABS: 0 10*3/uL (ref 0.0–0.1)
Basophils Relative: 1 %
Eosinophils Absolute: 0.2 10*3/uL (ref 0.0–0.5)
Eosinophils Relative: 3 %
HEMATOCRIT: 32.6 % — AB (ref 38.7–49.9)
HEMOGLOBIN: 10.7 g/dL — AB (ref 13.0–17.1)
Lymphocytes Relative: 11 %
Lymphs Abs: 0.7 10*3/uL — ABNORMAL LOW (ref 0.9–3.3)
MCH: 26.8 pg — ABNORMAL LOW (ref 28.0–33.4)
MCHC: 32.8 g/dL (ref 32.0–35.9)
MCV: 81.5 fL — ABNORMAL LOW (ref 82.0–98.0)
MONOS PCT: 17 %
Monocytes Absolute: 1.2 10*3/uL — ABNORMAL HIGH (ref 0.1–0.9)
NEUTROS ABS: 4.9 10*3/uL (ref 1.5–6.5)
NEUTROS PCT: 68 %
Platelet Count: 201 10*3/uL (ref 145–400)
RBC: 4 MIL/uL — ABNORMAL LOW (ref 4.20–5.70)
RDW: 16.8 % — ABNORMAL HIGH (ref 11.1–15.7)
WBC Count: 7.1 10*3/uL (ref 4.0–10.0)

## 2017-10-11 MED ORDER — SODIUM CHLORIDE 0.9% FLUSH
10.0000 mL | INTRAVENOUS | Status: DC | PRN
  Administered 2017-10-11: 10 mL
  Filled 2017-10-11: qty 10

## 2017-10-11 MED ORDER — DEXAMETHASONE SODIUM PHOSPHATE 10 MG/ML IJ SOLN
10.0000 mg | Freq: Once | INTRAMUSCULAR | Status: AC
  Administered 2017-10-11: 10 mg via INTRAVENOUS

## 2017-10-11 MED ORDER — SODIUM CHLORIDE 0.9 % IV SOLN
Freq: Once | INTRAVENOUS | Status: AC
  Administered 2017-10-11: 09:00:00 via INTRAVENOUS
  Filled 2017-10-11: qty 250

## 2017-10-11 MED ORDER — DEXAMETHASONE SODIUM PHOSPHATE 10 MG/ML IJ SOLN
INTRAMUSCULAR | Status: AC
  Filled 2017-10-11: qty 1

## 2017-10-11 MED ORDER — PALONOSETRON HCL INJECTION 0.25 MG/5ML
0.2500 mg | Freq: Once | INTRAVENOUS | Status: AC
  Administered 2017-10-11: 0.25 mg via INTRAVENOUS

## 2017-10-11 MED ORDER — PALONOSETRON HCL INJECTION 0.25 MG/5ML
INTRAVENOUS | Status: AC
  Filled 2017-10-11: qty 5

## 2017-10-11 MED ORDER — SODIUM CHLORIDE 0.9 % IV SOLN
300.0000 mg/m2 | Freq: Once | INTRAVENOUS | Status: AC
  Administered 2017-10-11: 620 mg via INTRAVENOUS
  Filled 2017-10-11: qty 31

## 2017-10-11 MED ORDER — HYDROMORPHONE HCL 2 MG/ML IJ SOLN
2.0000 mg | Freq: Once | INTRAMUSCULAR | Status: AC
  Administered 2017-10-11: 2 mg via INTRAVENOUS

## 2017-10-11 MED ORDER — DARBEPOETIN ALFA 300 MCG/0.6ML IJ SOSY: 300.0000 ug | PREFILLED_SYRINGE | Freq: Once | INTRAMUSCULAR | Status: DC

## 2017-10-11 MED ORDER — DEXTROSE 5 % IV SOLN
29.0000 mg/m2 | Freq: Once | INTRAVENOUS | Status: AC
  Administered 2017-10-11: 60 mg via INTRAVENOUS
  Filled 2017-10-11: qty 30

## 2017-10-11 MED ORDER — SODIUM CHLORIDE 0.9 % IV SOLN
Freq: Once | INTRAVENOUS | Status: AC
  Filled 2017-10-11: qty 250

## 2017-10-11 MED ORDER — HYDROMORPHONE HCL 2 MG/ML IJ SOLN
INTRAMUSCULAR | Status: AC
  Filled 2017-10-11: qty 1

## 2017-10-11 MED ORDER — HEPARIN SOD (PORK) LOCK FLUSH 100 UNIT/ML IV SOLN
500.0000 [IU] | Freq: Once | INTRAVENOUS | Status: AC | PRN
  Administered 2017-10-11: 500 [IU]
  Filled 2017-10-11: qty 5

## 2017-10-11 NOTE — Patient Instructions (Signed)
Implanted Port Home Guide An implanted port is a type of central line that is placed under the skin. Central lines are used to provide IV access when treatment or nutrition needs to be given through a person's veins. Implanted ports are used for long-term IV access. An implanted port may be placed because:  You need IV medicine that would be irritating to the small veins in your hands or arms.  You need long-term IV medicines, such as antibiotics.  You need IV nutrition for a long period.  You need frequent blood draws for lab tests.  You need dialysis.  Implanted ports are usually placed in the chest area, but they can also be placed in the upper arm, the abdomen, or the leg. An implanted port has two main parts:  Reservoir. The reservoir is round and will appear as a small, raised area under your skin. The reservoir is the part where a needle is inserted to give medicines or draw blood.  Catheter. The catheter is a thin, flexible tube that extends from the reservoir. The catheter is placed into a large vein. Medicine that is inserted into the reservoir goes into the catheter and then into the vein.  How will I care for my incision site? Do not get the incision site wet. Bathe or shower as directed by your health care provider. How is my port accessed? Special steps must be taken to access the port:  Before the port is accessed, a numbing cream can be placed on the skin. This helps numb the skin over the port site.  Your health care provider uses a sterile technique to access the port. ? Your health care provider must put on a mask and sterile gloves. ? The skin over your port is cleaned carefully with an antiseptic and allowed to dry. ? The port is gently pinched between sterile gloves, and a needle is inserted into the port.  Only "non-coring" port needles should be used to access the port. Once the port is accessed, a blood return should be checked. This helps ensure that the port  is in the vein and is not clogged.  If your port needs to remain accessed for a constant infusion, a clear (transparent) bandage will be placed over the needle site. The bandage and needle will need to be changed every week, or as directed by your health care provider.  Keep the bandage covering the needle clean and dry. Do not get it wet. Follow your health care provider's instructions on how to take a shower or bath while the port is accessed.  If your port does not need to stay accessed, no bandage is needed over the port.  What is flushing? Flushing helps keep the port from getting clogged. Follow your health care provider's instructions on how and when to flush the port. Ports are usually flushed with saline solution or a medicine called heparin. The need for flushing will depend on how the port is used.  If the port is used for intermittent medicines or blood draws, the port will need to be flushed: ? After medicines have been given. ? After blood has been drawn. ? As part of routine maintenance.  If a constant infusion is running, the port may not need to be flushed.  How long will my port stay implanted? The port can stay in for as long as your health care provider thinks it is needed. When it is time for the port to come out, surgery will be   done to remove it. The procedure is similar to the one performed when the port was put in. When should I seek immediate medical care? When you have an implanted port, you should seek immediate medical care if:  You notice a bad smell coming from the incision site.  You have swelling, redness, or drainage at the incision site.  You have more swelling or pain at the port site or the surrounding area.  You have a fever that is not controlled with medicine.  This information is not intended to replace advice given to you by your health care provider. Make sure you discuss any questions you have with your health care provider. Document  Released: 02/09/2005 Document Revised: 07/18/2015 Document Reviewed: 10/17/2012 Elsevier Interactive Patient Education  2017 [employer]  

## 2017-10-11 NOTE — Patient Instructions (Signed)
Cyclophosphamide injection What is this medicine? CYCLOPHOSPHAMIDE (sye kloe FOSS fa mide) is a chemotherapy drug. It slows the growth of cancer cells. This medicine is used to treat many types of cancer like lymphoma, myeloma, leukemia, breast cancer, and ovarian cancer, to name a few. This medicine may be used for other purposes; ask your health care provider or pharmacist if you have questions. COMMON BRAND NAME(S): Cytoxan, Neosar What should I tell my health care provider before I take this medicine? They need to know if you have any of these conditions: -blood disorders -history of other chemotherapy -infection -kidney disease -liver disease -recent or ongoing radiation therapy -tumors in the bone marrow -an unusual or allergic reaction to cyclophosphamide, other chemotherapy, other medicines, foods, dyes, or preservatives -pregnant or trying to get pregnant -breast-feeding How should I use this medicine? This drug is usually given as an injection into a vein or muscle or by infusion into a vein. It is administered in a hospital or clinic by a specially trained health care professional. Talk to your pediatrician regarding the use of this medicine in children. Special care may be needed. Overdosage: If you think you have taken too much of this medicine contact a poison control center or emergency room at once. NOTE: This medicine is only for you. Do not share this medicine with others. What if I miss a dose? It is important not to miss your dose. Call your doctor or health care professional if you are unable to keep an appointment. What may interact with this medicine? This medicine may interact with the following medications: -amiodarone -amphotericin B -azathioprine -certain antiviral medicines for HIV or AIDS such as protease inhibitors (e.g., indinavir, ritonavir) and zidovudine -certain blood pressure medications such as benazepril, captopril, enalapril, fosinopril,  lisinopril, moexipril, monopril, perindopril, quinapril, ramipril, trandolapril -certain cancer medications such as anthracyclines (e.g., daunorubicin, doxorubicin), busulfan, cytarabine, paclitaxel, pentostatin, tamoxifen, trastuzumab -certain diuretics such as chlorothiazide, chlorthalidone, hydrochlorothiazide, indapamide, metolazone -certain medicines that treat or prevent blood clots like warfarin -certain muscle relaxants such as succinylcholine -cyclosporine -etanercept -indomethacin -medicines to increase blood counts like filgrastim, pegfilgrastim, sargramostim -medicines used as general anesthesia -metronidazole -natalizumab This list may not describe all possible interactions. Give your health care provider a list of all the medicines, herbs, non-prescription drugs, or dietary supplements you use. Also tell them if you smoke, drink alcohol, or use illegal drugs. Some items may interact with your medicine. What should I watch for while using this medicine? Visit your doctor for checks on your progress. This drug may make you feel generally unwell. This is not uncommon, as chemotherapy can affect healthy cells as well as cancer cells. Report any side effects. Continue your course of treatment even though you feel ill unless your doctor tells you to stop. Drink water or other fluids as directed. Urinate often, even at night. In some cases, you may be given additional medicines to help with side effects. Follow all directions for their use. Call your doctor or health care professional for advice if you get a fever, chills or sore throat, or other symptoms of a cold or flu. Do not treat yourself. This drug decreases your body's ability to fight infections. Try to avoid being around people who are sick. This medicine may increase your risk to bruise or bleed. Call your doctor or health care professional if you notice any unusual bleeding. Be careful brushing and flossing your teeth or using a  toothpick because you may get an infection or bleed   more easily. If you have any dental work done, tell your dentist you are receiving this medicine. You may get drowsy or dizzy. Do not drive, use machinery, or do anything that needs mental alertness until you know how this medicine affects you. Do not become pregnant while taking this medicine or for 1 year after stopping it. Women should inform their doctor if they wish to become pregnant or think they might be pregnant. Men should not father a child while taking this medicine and for 4 months after stopping it. There is a potential for serious side effects to an unborn child. Talk to your health care professional or pharmacist for more information. Do not breast-feed an infant while taking this medicine. This medicine may interfere with the ability to have a child. This medicine has caused ovarian failure in some women. This medicine has caused reduced sperm counts in some men. You should talk with your doctor or health care professional if you are concerned about your fertility. If you are going to have surgery, tell your doctor or health care professional that you have taken this medicine. What side effects may I notice from receiving this medicine? Side effects that you should report to your doctor or health care professional as soon as possible: -allergic reactions like skin rash, itching or hives, swelling of the face, lips, or tongue -low blood counts - this medicine may decrease the number of white blood cells, red blood cells and platelets. You may be at increased risk for infections and bleeding. -signs of infection - fever or chills, cough, sore throat, pain or difficulty passing urine -signs of decreased platelets or bleeding - bruising, pinpoint red spots on the skin, black, tarry stools, blood in the urine -signs of decreased red blood cells - unusually weak or tired, fainting spells, lightheadedness -breathing problems -dark  urine -dizziness -palpitations -swelling of the ankles, feet, hands -trouble passing urine or change in the amount of urine -weight gain -yellowing of the eyes or skin Side effects that usually do not require medical attention (report to your doctor or health care professional if they continue or are bothersome): -changes in nail or skin color -hair loss -missed menstrual periods -mouth sores -nausea, vomiting This list may not describe all possible side effects. Call your doctor for medical advice about side effects. You may report side effects to FDA at 1-800-FDA-1088. Where should I keep my medicine? This drug is given in a hospital or clinic and will not be stored at home. NOTE: This sheet is a summary. It may not cover all possible information. If you have questions about this medicine, talk to your doctor, pharmacist, or health care provider.  2018 Elsevier/Gold Standard (2011-12-25 16:22:58) Carfilzomib injection What is this medicine? CARFILZOMIB (kar FILZ oh mib) targets a specific protein within cancer cells and stops the cancer cells from growing. It is used to treat multiple myeloma. This medicine may be used for other purposes; ask your health care provider or pharmacist if you have questions. COMMON BRAND NAME(S): KYPROLIS What should I tell my health care provider before I take this medicine? They need to know if you have any of these conditions: -heart disease -history of blood clots -irregular heartbeat -kidney disease -liver disease -lung or breathing disease -an unusual or allergic reaction to carfilzomib, or other medicines, foods, dyes, or preservatives -pregnant or trying to get pregnant -breast-feeding How should I use this medicine? This medicine is for injection or infusion into a vein. It is given  by a health care professional in a hospital or clinic setting. Talk to your pediatrician regarding the use of this medicine in children. Special care may be  needed. Overdosage: If you think you have taken too much of this medicine contact a poison control center or emergency room at once. NOTE: This medicine is only for you. Do not share this medicine with others. What if I miss a dose? It is important not to miss your dose. Call your doctor or health care professional if you are unable to keep an appointment. What may interact with this medicine? Interactions are not expected. Give your health care provider a list of all the medicines, herbs, non-prescription drugs, or dietary supplements you use. Also tell them if you smoke, drink alcohol, or use illegal drugs. Some items may interact with your medicine. This list may not describe all possible interactions. Give your health care provider a list of all the medicines, herbs, non-prescription drugs, or dietary supplements you use. Also tell them if you smoke, drink alcohol, or use illegal drugs. Some items may interact with your medicine. What should I watch for while using this medicine? Your condition will be monitored carefully while you are receiving this medicine. Report any side effects. Continue your course of treatment even though you feel ill unless your doctor tells you to stop. You may need blood work done while you are taking this medicine. Do not become pregnant while taking this medicine or for at least 30 days after stopping it. Women should inform their doctor if they wish to become pregnant or think they might be pregnant. There is a potential for serious side effects to an unborn child. Men should not father a child while taking this medicine and for 90 days after stopping it. Talk to your health care professional or pharmacist for more information. Do not breast-feed an infant while taking this medicine. Check with your doctor or health care professional if you get an attack of severe diarrhea, nausea and vomiting, or if you sweat a lot. The loss of too much body fluid can make it  dangerous for you to take this medicine. You may get dizzy. Do not drive, use machinery, or do anything that needs mental alertness until you know how this medicine affects you. Do not stand or sit up quickly, especially if you are an older patient. This reduces the risk of dizzy or fainting spells. What side effects may I notice from receiving this medicine? Side effects that you should report to your doctor or health care professional as soon as possible: -allergic reactions like skin rash, itching or hives, swelling of the face, lips, or tongue -confusion -dizziness -feeling faint or lightheaded -fever or chills -palpitations -seizures -signs and symptoms of bleeding such as bloody or black, tarry stools; red or dark-brown urine; spitting up blood or brown material that looks like coffee grounds; red spots on the skin; unusual bruising or bleeding including from the eye, gums, or nose -signs and symptoms of a blood clot such as breathing problems; changes in vision; chest pain; severe, sudden headache; pain, swelling, warmth in the leg; trouble speaking; sudden numbness or weakness of the face, arm or leg -signs and symptoms of kidney injury like trouble passing urine or change in the amount of urine -signs and symptoms of liver injury like dark yellow or brown urine; general ill feeling or flu-like symptoms; light-colored stools; loss of appetite; nausea; right upper belly pain; unusually weak or tired; yellowing of the   eyes or skin Side effects that usually do not require medical attention (report to your doctor or health care professional if they continue or are bothersome): -back pain -cough -diarrhea -headache -muscle cramps -vomiting This list may not describe all possible side effects. Call your doctor for medical advice about side effects. You may report side effects to FDA at 1-800-FDA-1088. Where should I keep my medicine? This drug is given in a hospital or clinic and will not  be stored at home. NOTE: This sheet is a summary. It may not cover all possible information. If you have questions about this medicine, talk to your doctor, pharmacist, or health care provider.  2018 Elsevier/Gold Standard (2015-03-14 13:39:23)  

## 2017-10-11 NOTE — Progress Notes (Signed)
Ok to treat per Dr [name] after reviewing labwork including Creatinine.

## 2017-10-11 NOTE — Progress Notes (Signed)
Hematology and Oncology Follow Up Visit  [name] [id number] 1950/04/02 67 y.o. 10/11/2017   Principle Diagnosis:  IgG kappa myeloma Anemia secondary to renal insufficiency Intermittent iron - deficiency anemia Hypotestosteronemia  Current Therapy:   Aredia 60 mg IV q 2 months -- next dose 10/2017 Aranesp 300 mcg subcu as needed for hemoglobin less than 11 DepoTestosterone 400 mg q 4 weeks IV iron as indicated Kyprolis/Cytoxan - s/p cycle#10   Interim History:  [name] is here today for follow-up. He is doing fairly well but still having some fatigue. He is due for testosterone this visit. Iron studies are pending.  M-spike last week was 0.3 mg/dL.  His IgG level was 0.6, IgG level 957 mg/dL and kappa light chains 2.5 mg/dL.   No fever, chills, cough, rash, dizziness, chest pain, palpitations, or changes in bowel or bladder habits.  SOB with over exertion is unchanged and he rests as needed.   He has occasional n/v due to GERD. He also has abdominal pain at times and states that he takes Bentyl for this but has run out and needs a refill.    He was able to see ortho and received steroid injections in both knees on 8/9. He states that this has helped quite a bit already.   No falls or syncopal episodes to report.  No swelling in his extremities at this time. The neuropathy in his hands and feet is unchanged.  No lymphadenopathy noted on exam.  His appetite comes and goes but he is staying well hydrated.  ECOG Performance Status: 1 - Symptomatic but completely ambulatory  Medications:  Allergies as of 10/11/2017      Reactions   Iodinated Diagnostic Agents Rash   Patient states he was instructed not to take IV contrast.  In 2008 he had an unknown reaction, and was told not to take it again.  He was also told not to take it due to his kidneys.   Iodine Anxiety, Rash, Other (See Comments)   Didn't feel right "instructed not to take per MD--something with his  port" Didn't feel right Didn't feel right "instructed not to take per MD--something with his port"      Medication List        Accurate as of 10/11/17  8:23 AM. Always use your most recent med list.          amLODipine 10 MG tablet Commonly known as:  NORVASC Take 10 mg by mouth daily.   CARAFATE 1 GM/10ML suspension Generic drug:  sucralfate Take 1 g by mouth 4 (four) times daily.   cefdinir 300 MG capsule Commonly known as:  OMNICEF Take 1 capsule (300 mg total) by mouth 2 (two) times daily.   ciclopirox 8 % solution Commonly known as:  PENLAC APP QD. REMOVE WEEKLY WITH NAIL POLISH REMOVER THEN REAPPLY   CLEAR EYES ALL SEASONS 5-6 MG/ML Soln Generic drug:  Polyvinyl Alcohol-Povidone Place 2 drops into both eyes 3 (three) times daily as needed (drye eyes.).   dicyclomine 10 MG capsule Commonly known as:  BENTYL Take 1 capsule (10 mg total) by mouth 3 (three) times daily.   dronabinol 2.5 MG capsule Commonly known as:  MARINOL Take 1 capsule (2.5 mg total) by mouth 2 (two) times daily before a meal.   EMBEDA 20-0.8 MG Cpcr Generic drug:  Morphine-Naltrexone Take 1 capsule by mouth 3 (three) times daily.   EMBEDA 30-1.2 MG Cpcr Generic drug:  Morphine-Naltrexone Take 3 (three) times daily by  mouth.   furosemide 40 MG tablet Commonly known as:  LASIX Take 40 mg by mouth.   glipiZIDE 5 MG tablet Commonly known as:  GLUCOTROL Take 5 mg by mouth 2 (two) times daily before a meal.   isosorbide mononitrate 30 MG 24 hr tablet Commonly known as:  IMDUR Take 30 mg by mouth daily.   latanoprost 0.005 % ophthalmic solution Commonly known as:  XALATAN Place 1 drop into the right eye every evening.   lidocaine-prilocaine cream Commonly known as:  EMLA Apply 1 application topically as needed.   linaclotide 145 MCG Caps capsule Commonly known as:  LINZESS Take 1 capsule (145 mcg total) by mouth daily before breakfast.   LORazepam 0.5 MG tablet Commonly known  as:  ATIVAN Take 1 tablet (0.5 mg total) by mouth every 6 (six) hours as needed (Nausea or vomiting).   losartan 25 MG tablet Commonly known as:  COZAAR Take 25 mg by mouth.   meclizine 50 MG tablet Commonly known as:  ANTIVERT Take 0.5 tablets (25 mg total) by mouth 4 (four) times daily as needed for dizziness.   methocarbamol 500 MG tablet Commonly known as:  ROBAXIN Take 500 mg by mouth 4 (four) times daily.   mirabegron ER 50 MG Tb24 tablet Commonly known as:  MYRBETRIQ Take 50 mg by mouth daily.   omeprazole 40 MG capsule Commonly known as:  PRILOSEC take 1 capsule by mouth twice a day   ondansetron 4 MG disintegrating tablet Commonly known as:  ZOFRAN-ODT Take 1 tablet (4 mg total) by mouth every 8 (eight) hours as needed for nausea or vomiting.   ondansetron 8 MG tablet Commonly known as:  ZOFRAN Take 1 tablet (8 mg total) by mouth 2 (two) times daily as needed (Nausea or vomiting).   ondansetron 8 MG tablet Commonly known as:  ZOFRAN TAKE 1 TABLET(8 MG) BY MOUTH TWICE DAILY AS NEEDED FOR NAUSEA OR VOMITING   orphenadrine 100 MG tablet Commonly known as:  NORFLEX Take 1 tablet (100 mg total) by mouth 2 (two) times daily.   oxyCODONE 20 mg 12 hr tablet Commonly known as:  OXYCONTIN Take 1 tablet (20 mg total) by mouth every 12 (twelve) hours.   oxyCODONE-acetaminophen 10-325 MG tablet Commonly known as:  PERCOCET Take 1 tablet by mouth every 6 (six) hours as needed for pain.   potassium chloride SA 20 MEQ tablet Commonly known as:  K-DUR,KLOR-CON Take 1 tablet (20 mEq total) by mouth 2 (two) times daily.   prochlorperazine 10 MG tablet Commonly known as:  COMPAZINE take 1 tablet by mouth every 6 hours if needed for nausea and vomiting   TOPROL XL 50 MG 24 hr tablet Generic drug:  metoprolol succinate Take 50 mg by mouth daily.   traMADol 50 MG tablet Commonly known as:  ULTRAM Take 1 tablet (50 mg total) by mouth every 6 (six) hours as needed.    traMADol 50 MG tablet Commonly known as:  ULTRAM TAKE 1 TABLET(50 MG) BY MOUTH EVERY 6 HOURS AS NEEDED   Vitamin D (Ergocalciferol) 50000 units Caps capsule Commonly known as:  DRISDOL Take 50,000 Units by mouth every Saturday.   ZIOPTAN 0.0015 % Soln Generic drug:  Tafluprost Place 1 drop into both eyes daily.       Allergies:  Allergies  Allergen Reactions  . Iodinated Diagnostic Agents Rash    Patient states he was instructed not to take IV contrast.  In 2008 he had an unknown reaction, and  was told not to take it again.  He was also told not to take it due to his kidneys.  . Iodine Anxiety, Rash and Other (See Comments)    Didn't feel right "instructed not to take per MD--something with his port" Didn't feel right Didn't feel right "instructed not to take per MD--something with his port"    Past Medical History, Surgical history, Social history, and Family History were reviewed and updated.  Review of Systems: All other 10 point review of systems is negative.   Physical Exam:  vitals were not taken for this visit.   Wt Readings from Last 3 Encounters:  10/04/17 196 lb (88.9 kg)  09/20/17 198 lb (89.8 kg)  09/06/17 194 lb (88 kg)    Ocular: Sclerae unicteric, pupils equal, round and reactive to light Ear-nose-throat: Oropharynx clear, dentition fair Lymphatic: No cervical, supraclavicular or axillary adenopathy Lungs no rales or rhonchi, good excursion bilaterally Heart regular rate and rhythm, no murmur appreciated Abd soft, nontender, positive bowel sounds, no liver or spleen tip palpated on exam, no fluid wave  MSK no focal spinal tenderness, no joint edema Neuro: non-focal, well-oriented, appropriate affect Breasts: Deferred   Lab Results  Component Value Date   WBC 6.0 10/04/2017   HGB 11.2 (L) 10/04/2017   HCT 33.8 (L) 10/04/2017   MCV 80.9 (L) 10/04/2017   PLT 227 10/04/2017   Lab Results  Component Value Date   FERRITIN 1,827 (H) 10/04/2017    IRON 145 10/04/2017   TIBC 244 10/04/2017   UIBC 99 10/04/2017   IRONPCTSAT 60 10/04/2017   Lab Results  Component Value Date   RETICCTPCT 1.1 10/04/2017   RBC 4.26 10/04/2017   RBC 4.18 (L) 10/04/2017   RETICCTABS 27.5 03/21/2014   Lab Results  Component Value Date   KPAFRELGTCHN 25.0 (H) 10/04/2017   LAMBDASER 15.2 10/04/2017   KAPLAMBRATIO 1.64 10/04/2017   Lab Results  Component Value Date   IGGSERUM 957 10/04/2017   IGA 161 10/04/2017   IGMSERUM 23 10/04/2017   Lab Results  Component Value Date   TOTALPROTELP 7.1 10/04/2017   ALBUMINELP 3.8 10/04/2017   A1GS 0.2 10/04/2017   A2GS 1.1 (H) 10/04/2017   BETS 1.0 10/04/2017   BETA2SER 0.5 11/07/2014   GAMS 1.0 10/04/2017   MSPIKE 0.3 (H) 10/04/2017   SPEI Comment 10/04/2017     Chemistry      Component Value Date/Time   NA 138 10/04/2017 0755   NA 143 02/22/2017 0744   K 3.7 10/04/2017 0755   K 4.0 02/22/2017 0744   CL 100 10/04/2017 0755   CL 106 02/22/2017 0744   CO2 30 10/04/2017 0755   CO2 27 02/22/2017 0744   BUN 41 (H) 10/04/2017 0755   BUN 26 (H) 02/22/2017 0744   CREATININE 2.20 (H) 10/04/2017 0755   CREATININE 2.3 (H) 02/22/2017 0744      Component Value Date/Time   CALCIUM 9.1 10/04/2017 0755   CALCIUM 9.3 02/22/2017 0744   ALKPHOS 78 10/04/2017 0755   ALKPHOS 80 02/22/2017 0744   AST 24 10/04/2017 0755   ALT 39 10/04/2017 0755   ALT 22 02/22/2017 0744   BILITOT 0.6 10/04/2017 0755      Impression and Plan: Mr. Diehl is avery pleasant 68 yo African American gentleman with IgG kappa myeloma. He is tolerating treatment nicely.   So far, everything seems to be going pretty well with the myeloma.  He is nice and low.  He has multiple other  issues.  He does have low testosterone.  We last checked his testosterone level, it was only 96.  We are going to have to increase his testosterone injections.  I think this will make him feel a little bit better.  I want to keep follow him  closely.  We will plan to have him come back to see [place] in another couple weeks.      Volanda Napoleon, MD 8/19/20198:23 AM

## 2017-10-18 ENCOUNTER — Other Ambulatory Visit: Payer: Self-pay

## 2017-10-18 ENCOUNTER — Inpatient Hospital Stay: Payer: Medicare Other

## 2017-10-18 ENCOUNTER — Encounter: Payer: Self-pay | Admitting: Family

## 2017-10-18 ENCOUNTER — Inpatient Hospital Stay (HOSPITAL_BASED_OUTPATIENT_CLINIC_OR_DEPARTMENT_OTHER): Payer: Medicare Other | Admitting: Family

## 2017-10-18 VITALS — BP 146/71 | HR 101 | Temp 98.9°F | Resp 19 | Wt 197.0 lb

## 2017-10-18 DIAGNOSIS — D500 Iron deficiency anemia secondary to blood loss (chronic): Secondary | ICD-10-CM

## 2017-10-18 DIAGNOSIS — D631 Anemia in chronic kidney disease: Secondary | ICD-10-CM

## 2017-10-18 DIAGNOSIS — C9000 Multiple myeloma not having achieved remission: Secondary | ICD-10-CM

## 2017-10-18 DIAGNOSIS — Z79899 Other long term (current) drug therapy: Secondary | ICD-10-CM | POA: Diagnosis not present

## 2017-10-18 DIAGNOSIS — E349 Endocrine disorder, unspecified: Secondary | ICD-10-CM

## 2017-10-18 DIAGNOSIS — M4712 Other spondylosis with myelopathy, cervical region: Secondary | ICD-10-CM

## 2017-10-18 DIAGNOSIS — N189 Chronic kidney disease, unspecified: Principal | ICD-10-CM

## 2017-10-18 DIAGNOSIS — M1990 Unspecified osteoarthritis, unspecified site: Secondary | ICD-10-CM

## 2017-10-18 DIAGNOSIS — Z5112 Encounter for antineoplastic immunotherapy: Secondary | ICD-10-CM | POA: Diagnosis not present

## 2017-10-18 DIAGNOSIS — Z299 Encounter for prophylactic measures, unspecified: Secondary | ICD-10-CM

## 2017-10-18 LAB — CBC WITH DIFFERENTIAL (CANCER CENTER ONLY)
Basophils Absolute: 0 10*3/uL (ref 0.0–0.1)
Basophils Relative: 1 %
EOS ABS: 0.3 10*3/uL (ref 0.0–0.5)
EOS PCT: 5 %
HCT: 33 % — ABNORMAL LOW (ref 38.7–49.9)
Hemoglobin: 10.8 g/dL — ABNORMAL LOW (ref 13.0–17.1)
LYMPHS ABS: 0.6 10*3/uL — AB (ref 0.9–3.3)
Lymphocytes Relative: 11 %
MCH: 26.5 pg — AB (ref 28.0–33.4)
MCHC: 32.7 g/dL (ref 32.0–35.9)
MCV: 80.9 fL — ABNORMAL LOW (ref 82.0–98.0)
MONO ABS: 0.7 10*3/uL (ref 0.1–0.9)
Monocytes Relative: 14 %
Neutro Abs: 3.7 10*3/uL (ref 1.5–6.5)
Neutrophils Relative %: 69 %
PLATELETS: 158 10*3/uL (ref 145–400)
RBC: 4.08 MIL/uL — ABNORMAL LOW (ref 4.20–5.70)
RDW: 16.4 % — AB (ref 11.1–15.7)
WBC: 5.3 10*3/uL (ref 4.0–10.0)

## 2017-10-18 LAB — CMP (CANCER CENTER ONLY)
ALBUMIN: 3.5 g/dL (ref 3.5–5.0)
ALK PHOS: 91 U/L — AB (ref 26–84)
ALT: 23 U/L (ref 10–47)
AST: 23 U/L (ref 11–38)
Anion gap: 6 (ref 5–15)
BUN: 18 mg/dL (ref 7–22)
CO2: 29 mmol/L (ref 18–33)
CREATININE: 2 mg/dL — AB (ref 0.60–1.20)
Calcium: 9.5 mg/dL (ref 8.0–10.3)
Chloride: 103 mmol/L (ref 98–108)
Glucose, Bld: 238 mg/dL — ABNORMAL HIGH (ref 73–118)
Potassium: 3.9 mmol/L (ref 3.3–4.7)
Sodium: 138 mmol/L (ref 128–145)
Total Bilirubin: 0.5 mg/dL (ref 0.2–1.6)
Total Protein: 7.2 g/dL (ref 6.4–8.1)

## 2017-10-18 LAB — IRON AND TIBC
Iron: 57 ug/dL (ref 42–163)
Saturation Ratios: 25 % — ABNORMAL LOW (ref 42–163)
TIBC: 230 ug/dL (ref 202–409)
UIBC: 173 ug/dL

## 2017-10-18 LAB — FERRITIN: Ferritin: 1855 ng/mL — ABNORMAL HIGH (ref 24–336)

## 2017-10-18 MED ORDER — DEXAMETHASONE SODIUM PHOSPHATE 10 MG/ML IJ SOLN
10.0000 mg | Freq: Once | INTRAMUSCULAR | Status: AC
  Administered 2017-10-18: 10 mg via INTRAVENOUS

## 2017-10-18 MED ORDER — HYDROMORPHONE HCL 2 MG/ML IJ SOLN
2.0000 mg | Freq: Once | INTRAMUSCULAR | Status: AC
  Administered 2017-10-18: 2 mg via INTRAVENOUS

## 2017-10-18 MED ORDER — SODIUM CHLORIDE 0.9 % IV SOLN
Freq: Once | INTRAVENOUS | Status: AC
  Administered 2017-10-18: 09:00:00 via INTRAVENOUS
  Filled 2017-10-18: qty 250

## 2017-10-18 MED ORDER — HEPARIN SOD (PORK) LOCK FLUSH 100 UNIT/ML IV SOLN
500.0000 [IU] | Freq: Once | INTRAVENOUS | Status: AC | PRN
  Administered 2017-10-18: 500 [IU]
  Filled 2017-10-18: qty 5

## 2017-10-18 MED ORDER — SODIUM CHLORIDE 0.9% FLUSH
10.0000 mL | INTRAVENOUS | Status: DC | PRN
  Administered 2017-10-18: 10 mL
  Filled 2017-10-18: qty 10

## 2017-10-18 MED ORDER — HYDROMORPHONE HCL 2 MG/ML IJ SOLN
INTRAMUSCULAR | Status: AC
  Filled 2017-10-18: qty 1

## 2017-10-18 MED ORDER — DEXTROSE 5 % IV SOLN
29.0000 mg/m2 | Freq: Once | INTRAVENOUS | Status: AC
  Administered 2017-10-18: 60 mg via INTRAVENOUS
  Filled 2017-10-18: qty 30

## 2017-10-18 MED ORDER — PALONOSETRON HCL INJECTION 0.25 MG/5ML
0.2500 mg | Freq: Once | INTRAVENOUS | Status: AC
  Administered 2017-10-18: 0.25 mg via INTRAVENOUS

## 2017-10-18 MED ORDER — SODIUM CHLORIDE 0.9 % IV SOLN
Freq: Once | INTRAVENOUS | Status: AC
  Filled 2017-10-18: qty 250

## 2017-10-18 MED ORDER — DEXAMETHASONE SODIUM PHOSPHATE 10 MG/ML IJ SOLN
INTRAMUSCULAR | Status: AC
  Filled 2017-10-18: qty 1

## 2017-10-18 MED ORDER — ALTEPLASE 2 MG IJ SOLR
2.0000 mg | Freq: Once | INTRAMUSCULAR | Status: DC | PRN
  Filled 2017-10-18: qty 2

## 2017-10-18 MED ORDER — SODIUM CHLORIDE 0.9 % IV SOLN
300.0000 mg/m2 | Freq: Once | INTRAVENOUS | Status: AC
  Administered 2017-10-18: 620 mg via INTRAVENOUS
  Filled 2017-10-18: qty 31

## 2017-10-18 MED ORDER — PALONOSETRON HCL INJECTION 0.25 MG/5ML
INTRAVENOUS | Status: AC
  Filled 2017-10-18: qty 5

## 2017-10-18 NOTE — Progress Notes (Signed)
Hematology and Oncology Follow Up Visit  [name] [id number] 11-26-1950 67 y.o. 10/18/2017   Principle Diagnosis:  IgG kappa myeloma Anemia secondary to renal insufficiency Intermittent iron - deficiency anemia Hypotestosteronemia  Current Therapy:   Aredia 60 mg IV q 2 months -- next dose 10/2017 Aranesp 300 mcg subcu as needed for hemoglobin less than 11 DepoTestosterone 400 mg q 4 weeks IV iron as indicated Kyprolis/Cytoxan - s/p cycle10   Interim History:  [name] is here today for follow-up and treatment. He is still having a lot of pain in the back and joints due to arthritis and states that he is scheduled to follow up with his orthopedist later this week.  He also complains of fatigue. His Testosterone 2 weeks ago was down to 96 and we have increased his Testosterone to 400 mg. Hopefully this will make a difference for him.  His M-spike was down to 0.3, IgG level 957 mg/dL and kappa light chains 2.50 mg/dL.  He has had no issue with infections since we last saw him. No fever, chills, n/v, cough, rash, dizziness, chest pain, palpitations or changes in bowel or bladder habits.  His abdominal pain is controlled with Bentyl. No episodes of bleeding, no bruising or petechiae.  He has some mild SOB with over exertion and takes breaks to rest as needed.  He has swelling in his joints off and on. 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  His appetite comes and goes. He just does not have much of a desire to eat sometimes. He is staying well hydrated. His weight is down 3 lbs since his last visit.   ECOG Performance Status: 1 - Symptomatic but completely ambulatory  Medications:  Allergies as of 10/18/2017      Reactions   Iodinated Diagnostic Agents Rash   Patient states he was instructed not to take IV contrast.  In 2008 he had an unknown reaction, and was told not to take it again.  He was also told not to take it due to his kidneys.   Iodine  Anxiety, Rash, Other (See Comments)   Didn't feel right "instructed not to take per MD--something with his port" Didn't feel right Didn't feel right "instructed not to take per MD--something with his port"      Medication List        Accurate as of 10/18/17  8:59 AM. Always use your most recent med list.          amLODipine 10 MG tablet Commonly known as:  NORVASC Take 10 mg by mouth daily.   CARAFATE 1 GM/10ML suspension Generic drug:  sucralfate Take 1 g by mouth 4 (four) times daily.   cefdinir 300 MG capsule Commonly known as:  OMNICEF Take 1 capsule (300 mg total) by mouth 2 (two) times daily.   ciclopirox 8 % solution Commonly known as:  PENLAC APP QD. REMOVE WEEKLY WITH NAIL POLISH REMOVER THEN REAPPLY   CLEAR EYES ALL SEASONS 5-6 MG/ML Soln Generic drug:  Polyvinyl Alcohol-Povidone Place 2 drops into both eyes 3 (three) times daily as needed (drye eyes.).   dicyclomine 10 MG capsule Commonly known as:  BENTYL Take 1 capsule (10 mg total) by mouth 3 (three) times daily.   dronabinol 2.5 MG capsule Commonly known as:  MARINOL Take 1 capsule (2.5 mg total) by mouth 2 (two) times daily before a meal.   EMBEDA 20-0.8 MG Cpcr Generic drug:  Morphine-Naltrexone Take 1 capsule by mouth  3 (three) times daily.   EMBEDA 30-1.2 MG Cpcr Generic drug:  Morphine-Naltrexone Take 3 (three) times daily by mouth.   furosemide 40 MG tablet Commonly known as:  LASIX Take 40 mg by mouth.   glipiZIDE 5 MG tablet Commonly known as:  GLUCOTROL Take 5 mg by mouth 2 (two) times daily before a meal.   isosorbide mononitrate 30 MG 24 hr tablet Commonly known as:  IMDUR Take 30 mg by mouth daily.   latanoprost 0.005 % ophthalmic solution Commonly known as:  XALATAN Place 1 drop into the right eye every evening.   lidocaine-prilocaine cream Commonly known as:  EMLA Apply 1 application topically as needed.   linaclotide 145 MCG Caps capsule Commonly known as:   LINZESS Take 1 capsule (145 mcg total) by mouth daily before breakfast.   LORazepam 0.5 MG tablet Commonly known as:  ATIVAN Take 1 tablet (0.5 mg total) by mouth every 6 (six) hours as needed (Nausea or vomiting).   losartan 25 MG tablet Commonly known as:  COZAAR Take 25 mg by mouth.   meclizine 50 MG tablet Commonly known as:  ANTIVERT Take 0.5 tablets (25 mg total) by mouth 4 (four) times daily as needed for dizziness.   methocarbamol 500 MG tablet Commonly known as:  ROBAXIN Take 500 mg by mouth 4 (four) times daily.   mirabegron ER 50 MG Tb24 tablet Commonly known as:  MYRBETRIQ Take 50 mg by mouth daily.   omeprazole 40 MG capsule Commonly known as:  PRILOSEC take 1 capsule by mouth twice a day   ondansetron 4 MG disintegrating tablet Commonly known as:  ZOFRAN-ODT Take 1 tablet (4 mg total) by mouth every 8 (eight) hours as needed for nausea or vomiting.   ondansetron 8 MG tablet Commonly known as:  ZOFRAN Take 1 tablet (8 mg total) by mouth 2 (two) times daily as needed (Nausea or vomiting).   ondansetron 8 MG tablet Commonly known as:  ZOFRAN TAKE 1 TABLET(8 MG) BY MOUTH TWICE DAILY AS NEEDED FOR NAUSEA OR VOMITING   orphenadrine 100 MG tablet Commonly known as:  NORFLEX Take 1 tablet (100 mg total) by mouth 2 (two) times daily.   oxyCODONE 20 mg 12 hr tablet Commonly known as:  OXYCONTIN Take 1 tablet (20 mg total) by mouth every 12 (twelve) hours.   oxyCODONE-acetaminophen 10-325 MG tablet Commonly known as:  PERCOCET Take 1 tablet by mouth every 6 (six) hours as needed for pain.   potassium chloride SA 20 MEQ tablet Commonly known as:  K-DUR,KLOR-CON Take 1 tablet (20 mEq total) by mouth 2 (two) times daily.   prochlorperazine 10 MG tablet Commonly known as:  COMPAZINE take 1 tablet by mouth every 6 hours if needed for nausea and vomiting   TOPROL XL 50 MG 24 hr tablet Generic drug:  metoprolol succinate Take 50 mg by mouth daily.   traMADol  50 MG tablet Commonly known as:  ULTRAM Take 1 tablet (50 mg total) by mouth every 6 (six) hours as needed.   traMADol 50 MG tablet Commonly known as:  ULTRAM TAKE 1 TABLET(50 MG) BY MOUTH EVERY 6 HOURS AS NEEDED   Vitamin D (Ergocalciferol) 50000 units Caps capsule Commonly known as:  DRISDOL Take 50,000 Units by mouth every Saturday.   ZIOPTAN 0.0015 % Soln Generic drug:  Tafluprost Place 1 drop into both eyes daily.       Allergies:  Allergies  Allergen Reactions  . Iodinated Diagnostic Agents Rash  Patient states he was instructed not to take IV contrast.  In 2008 he had an unknown reaction, and was told not to take it again.  He was also told not to take it due to his kidneys.  . Iodine Anxiety, Rash and Other (See Comments)    Didn't feel right "instructed not to take per MD--something with his port" Didn't feel right Didn't feel right "instructed not to take per MD--something with his port"    Past Medical History, Surgical history, Social history, and Family History were reviewed and updated.  Review of Systems: All other 10 point review of systems is negative.   Physical Exam:  weight is 197 lb (89.4 kg). His oral temperature is 98.9 F (37.2 C). His blood pressure is 146/71 (abnormal) and his pulse is 101 (abnormal). His respiration is 19 and oxygen saturation is 100%.   Wt Readings from Last 3 Encounters:  10/18/17 197 lb (89.4 kg)  10/11/17 200 lb (90.7 kg)  10/04/17 196 lb (88.9 kg)    Ocular: Sclerae unicteric, pupils equal, round and reactive to light Ear-nose-throat: Oropharynx clear, dentition fair Lymphatic: No cervical, supraclavicular or axillary adenopathy Lungs no rales or rhonchi, good excursion bilaterally Heart regular rate and rhythm, no murmur appreciated Abd soft, nontender, positive bowel sounds, no liver or spleen tip palpated on exam, no fluid wave  MSK no focal spinal tenderness, no joint edema Neuro: non-focal, well-oriented,  appropriate affect Breasts: Deferred   Lab Results  Component Value Date   WBC 5.3 10/18/2017   HGB 10.8 (L) 10/18/2017   HCT 33.0 (L) 10/18/2017   MCV 80.9 (L) 10/18/2017   PLT PENDING 10/18/2017   Lab Results  Component Value Date   FERRITIN 1,827 (H) 10/04/2017   IRON 145 10/04/2017   TIBC 244 10/04/2017   UIBC 99 10/04/2017   IRONPCTSAT 60 10/04/2017   Lab Results  Component Value Date   RETICCTPCT 1.1 10/04/2017   RBC 4.08 (L) 10/18/2017   RETICCTABS 27.5 03/21/2014   Lab Results  Component Value Date   KPAFRELGTCHN 25.0 (H) 10/04/2017   LAMBDASER 15.2 10/04/2017   KAPLAMBRATIO 1.64 10/04/2017   Lab Results  Component Value Date   IGGSERUM 957 10/04/2017   IGA 161 10/04/2017   IGMSERUM 23 10/04/2017   Lab Results  Component Value Date   TOTALPROTELP 7.1 10/04/2017   ALBUMINELP 3.8 10/04/2017   A1GS 0.2 10/04/2017   A2GS 1.1 (H) 10/04/2017   BETS 1.0 10/04/2017   BETA2SER 0.5 11/07/2014   GAMS 1.0 10/04/2017   MSPIKE 0.3 (H) 10/04/2017   SPEI Comment 10/04/2017     Chemistry      Component Value Date/Time   NA 141 10/11/2017 0815   NA 143 02/22/2017 0744   K 4.0 10/11/2017 0815   K 4.0 02/22/2017 0744   CL 106 10/11/2017 0815   CL 106 02/22/2017 0744   CO2 30 10/11/2017 0815   CO2 27 02/22/2017 0744   BUN 26 (H) 10/11/2017 0815   BUN 26 (H) 02/22/2017 0744   CREATININE 2.50 (H) 10/11/2017 0815   CREATININE 2.3 (H) 02/22/2017 0744      Component Value Date/Time   CALCIUM 8.4 10/11/2017 0815   CALCIUM 9.3 02/22/2017 0744   ALKPHOS 71 10/11/2017 0815   ALKPHOS 80 02/22/2017 0744   AST 20 10/11/2017 0815   ALT 26 10/11/2017 0815   ALT 22 02/22/2017 0744   BILITOT 0.4 10/11/2017 0815      Impression and Plan: Ms. Halls  is a very pleasant 67 yo African American gentleman with IgG kappa myeloma. He is tolerating treatment well and his myeloma studies have seemed to improve.  His arthritis seems to be his biggest problem right now and he is  scheduled to see his orthopedist later this week.  We will proceed with treatment today as planned.  We will plan to see him back in another 2 weeks for follow-up and treatment.  He will contact our office with any questions or concerns. We can certainly see him sooner if need be.   Laverna Peace, NP 8/26/20198:59 AM

## 2017-10-18 NOTE — Patient Instructions (Signed)
Implanted Port Home Guide An implanted port is a type of central line that is placed under the skin. Central lines are used to provide IV access when treatment or nutrition needs to be given through a person's veins. Implanted ports are used for long-term IV access. An implanted port may be placed because:  You need IV medicine that would be irritating to the small veins in your hands or arms.  You need long-term IV medicines, such as antibiotics.  You need IV nutrition for a long period.  You need frequent blood draws for lab tests.  You need dialysis.  Implanted ports are usually placed in the chest area, but they can also be placed in the upper arm, the abdomen, or the leg. An implanted port has two main parts:  Reservoir. The reservoir is round and will appear as a small, raised area under your skin. The reservoir is the part where a needle is inserted to give medicines or draw blood.  Catheter. The catheter is a thin, flexible tube that extends from the reservoir. The catheter is placed into a large vein. Medicine that is inserted into the reservoir goes into the catheter and then into the vein.  How will I care for my incision site? Do not get the incision site wet. Bathe or shower as directed by your health care provider. How is my port accessed? Special steps must be taken to access the port:  Before the port is accessed, a numbing cream can be placed on the skin. This helps numb the skin over the port site.  Your health care provider uses a sterile technique to access the port. ? Your health care provider must put on a mask and sterile gloves. ? The skin over your port is cleaned carefully with an antiseptic and allowed to dry. ? The port is gently pinched between sterile gloves, and a needle is inserted into the port.  Only "non-coring" port needles should be used to access the port. Once the port is accessed, a blood return should be checked. This helps ensure that the port  is in the vein and is not clogged.  If your port needs to remain accessed for a constant infusion, a clear (transparent) bandage will be placed over the needle site. The bandage and needle will need to be changed every week, or as directed by your health care provider.  Keep the bandage covering the needle clean and dry. Do not get it wet. Follow your health care provider's instructions on how to take a shower or bath while the port is accessed.  If your port does not need to stay accessed, no bandage is needed over the port.  What is flushing? Flushing helps keep the port from getting clogged. Follow your health care provider's instructions on how and when to flush the port. Ports are usually flushed with saline solution or a medicine called heparin. The need for flushing will depend on how the port is used.  If the port is used for intermittent medicines or blood draws, the port will need to be flushed: ? After medicines have been given. ? After blood has been drawn. ? As part of routine maintenance.  If a constant infusion is running, the port may not need to be flushed.  How long will my port stay implanted? The port can stay in for as long as your health care provider thinks it is needed. When it is time for the port to come out, surgery will be   done to remove it. The procedure is similar to the one performed when the port was put in. When should I seek immediate medical care? When you have an implanted port, you should seek immediate medical care if:  You notice a bad smell coming from the incision site.  You have swelling, redness, or drainage at the incision site.  You have more swelling or pain at the port site or the surrounding area.  You have a fever that is not controlled with medicine.  This information is not intended to replace advice given to you by your health care provider. Make sure you discuss any questions you have with your health care provider. Document  Released: 02/09/2005 Document Revised: 07/18/2015 Document Reviewed: 10/17/2012 Elsevier Interactive Patient Education  2017 [employer]  

## 2017-10-18 NOTE — Patient Instructions (Signed)
Carfilzomib injection What is this medicine? CARFILZOMIB (kar FILZ oh mib) targets a specific protein within cancer cells and stops the cancer cells from growing. It is used to treat multiple myeloma. This medicine may be used for other purposes; ask your health care provider or pharmacist if you have questions. COMMON BRAND NAME(S): KYPROLIS What should I tell my health care provider before I take this medicine? They need to know if you have any of these conditions: -heart disease -history of blood clots -irregular heartbeat -kidney disease -liver disease -lung or breathing disease -an unusual or allergic reaction to carfilzomib, or other medicines, foods, dyes, or preservatives -pregnant or trying to get pregnant -breast-feeding How should I use this medicine? This medicine is for injection or infusion into a vein. It is given by a health care professional in a hospital or clinic setting. Talk to your pediatrician regarding the use of this medicine in children. Special care may be needed. Overdosage: If you think you have taken too much of this medicine contact a poison control center or emergency room at once. NOTE: This medicine is only for you. Do not share this medicine with others. What if I miss a dose? It is important not to miss your dose. Call your doctor or health care professional if you are unable to keep an appointment. What may interact with this medicine? Interactions are not expected. Give your health care provider a list of all the medicines, herbs, non-prescription drugs, or dietary supplements you use. Also tell them if you smoke, drink alcohol, or use illegal drugs. Some items may interact with your medicine. This list may not describe all possible interactions. Give your health care provider a list of all the medicines, herbs, non-prescription drugs, or dietary supplements you use. Also tell them if you smoke, drink alcohol, or use illegal drugs. Some items may  interact with your medicine. What should I watch for while using this medicine? Your condition will be monitored carefully while you are receiving this medicine. Report any side effects. Continue your course of treatment even though you feel ill unless your doctor tells you to stop. You may need blood work done while you are taking this medicine. Do not become pregnant while taking this medicine or for at least 30 days after stopping it. Women should inform their doctor if they wish to become pregnant or think they might be pregnant. There is a potential for serious side effects to an unborn child. Men should not father a child while taking this medicine and for 90 days after stopping it. Talk to your health care professional or pharmacist for more information. Do not breast-feed an infant while taking this medicine. Check with your doctor or health care professional if you get an attack of severe diarrhea, nausea and vomiting, or if you sweat a lot. The loss of too much body fluid can make it dangerous for you to take this medicine. You may get dizzy. Do not drive, use machinery, or do anything that needs mental alertness until you know how this medicine affects you. Do not stand or sit up quickly, especially if you are an older patient. This reduces the risk of dizzy or fainting spells. What side effects may I notice from receiving this medicine? Side effects that you should report to your doctor or health care professional as soon as possible: -allergic reactions like skin rash, itching or hives, swelling of the face, lips, or tongue -confusion -dizziness -feeling faint or lightheaded -fever or chills -  palpitations -seizures -signs and symptoms of bleeding such as bloody or black, tarry stools; red or dark-brown urine; spitting up blood or brown material that looks like coffee grounds; red spots on the skin; unusual bruising or bleeding including from the eye, gums, or nose -signs and symptoms of  a blood clot such as breathing problems; changes in vision; chest pain; severe, sudden headache; pain, swelling, warmth in the leg; trouble speaking; sudden numbness or weakness of the face, arm or leg -signs and symptoms of kidney injury like trouble passing urine or change in the amount of urine -signs and symptoms of liver injury like dark yellow or brown urine; general ill feeling or flu-like symptoms; light-colored stools; loss of appetite; nausea; right upper belly pain; unusually weak or tired; yellowing of the eyes or skin Side effects that usually do not require medical attention (report to your doctor or health care professional if they continue or are bothersome): -back pain -cough -diarrhea -headache -muscle cramps -vomiting This list may not describe all possible side effects. Call your doctor for medical advice about side effects. You may report side effects to FDA at 1-800-FDA-1088. Where should I keep my medicine? This drug is given in a hospital or clinic and will not be stored at home. NOTE: This sheet is a summary. It may not cover all possible information. If you have questions about this medicine, talk to your doctor, pharmacist, or health care provider.  2018 Elsevier/Gold Standard (2015-03-14 13:39:23)  Cyclophosphamide injection What is this medicine? CYCLOPHOSPHAMIDE (sye kloe FOSS fa mide) is a chemotherapy drug. It slows the growth of cancer cells. This medicine is used to treat many types of cancer like lymphoma, myeloma, leukemia, breast cancer, and ovarian cancer, to name a few. This medicine may be used for other purposes; ask your health care provider or pharmacist if you have questions. COMMON BRAND NAME(S): Cytoxan, Neosar What should I tell my health care provider before I take this medicine? They need to know if you have any of these conditions: -blood disorders -history of other chemotherapy -infection -kidney disease -liver disease -recent or ongoing  radiation therapy -tumors in the bone marrow -an unusual or allergic reaction to cyclophosphamide, other chemotherapy, other medicines, foods, dyes, or preservatives -pregnant or trying to get pregnant -breast-feeding How should I use this medicine? This drug is usually given as an injection into a vein or muscle or by infusion into a vein. It is administered in a hospital or clinic by a specially trained health care professional. Talk to your pediatrician regarding the use of this medicine in children. Special care may be needed. Overdosage: If you think you have taken too much of this medicine contact a poison control center or emergency room at once. NOTE: This medicine is only for you. Do not share this medicine with others. What if I miss a dose? It is important not to miss your dose. Call your doctor or health care professional if you are unable to keep an appointment. What may interact with this medicine? This medicine may interact with the following medications: -amiodarone -amphotericin B -azathioprine -certain antiviral medicines for HIV or AIDS such as protease inhibitors (e.g., indinavir, ritonavir) and zidovudine -certain blood pressure medications such as benazepril, captopril, enalapril, fosinopril, lisinopril, moexipril, monopril, perindopril, quinapril, ramipril, trandolapril -certain cancer medications such as anthracyclines (e.g., daunorubicin, doxorubicin), busulfan, cytarabine, paclitaxel, pentostatin, tamoxifen, trastuzumab -certain diuretics such as chlorothiazide, chlorthalidone, hydrochlorothiazide, indapamide, metolazone -certain medicines that treat or prevent blood clots like warfarin -certain   muscle relaxants such as succinylcholine -cyclosporine -etanercept -indomethacin -medicines to increase blood counts like filgrastim, pegfilgrastim, sargramostim -medicines used as general anesthesia -metronidazole -natalizumab This list may not describe all possible  interactions. Give your health care provider a list of all the medicines, herbs, non-prescription drugs, or dietary supplements you use. Also tell them if you smoke, drink alcohol, or use illegal drugs. Some items may interact with your medicine. What should I watch for while using this medicine? Visit your doctor for checks on your progress. This drug may make you feel generally unwell. This is not uncommon, as chemotherapy can affect healthy cells as well as cancer cells. Report any side effects. Continue your course of treatment even though you feel ill unless your doctor tells you to stop. Drink water or other fluids as directed. Urinate often, even at night. In some cases, you may be given additional medicines to help with side effects. Follow all directions for their use. Call your doctor or health care professional for advice if you get a fever, chills or sore throat, or other symptoms of a cold or flu. Do not treat yourself. This drug decreases your body's ability to fight infections. Try to avoid being around people who are sick. This medicine may increase your risk to bruise or bleed. Call your doctor or health care professional if you notice any unusual bleeding. Be careful brushing and flossing your teeth or using a toothpick because you may get an infection or bleed more easily. If you have any dental work done, tell your dentist you are receiving this medicine. You may get drowsy or dizzy. Do not drive, use machinery, or do anything that needs mental alertness until you know how this medicine affects you. Do not become pregnant while taking this medicine or for 1 year after stopping it. Women should inform their doctor if they wish to become pregnant or think they might be pregnant. Men should not father a child while taking this medicine and for 4 months after stopping it. There is a potential for serious side effects to an unborn child. Talk to your health care professional or pharmacist for  more information. Do not breast-feed an infant while taking this medicine. This medicine may interfere with the ability to have a child. This medicine has caused ovarian failure in some women. This medicine has caused reduced sperm counts in some men. You should talk with your doctor or health care professional if you are concerned about your fertility. If you are going to have surgery, tell your doctor or health care professional that you have taken this medicine. What side effects may I notice from receiving this medicine? Side effects that you should report to your doctor or health care professional as soon as possible: -allergic reactions like skin rash, itching or hives, swelling of the face, lips, or tongue -low blood counts - this medicine may decrease the number of white blood cells, red blood cells and platelets. You may be at increased risk for infections and bleeding. -signs of infection - fever or chills, cough, sore throat, pain or difficulty passing urine -signs of decreased platelets or bleeding - bruising, pinpoint red spots on the skin, black, tarry stools, blood in the urine -signs of decreased red blood cells - unusually weak or tired, fainting spells, lightheadedness -breathing problems -dark urine -dizziness -palpitations -swelling of the ankles, feet, hands -trouble passing urine or change in the amount of urine -weight gain -yellowing of the eyes or skin Side effects that   usually do not require medical attention (report to your doctor or health care professional if they continue or are bothersome): -changes in nail or skin color -hair loss -missed menstrual periods -mouth sores -nausea, vomiting This list may not describe all possible side effects. Call your doctor for medical advice about side effects. You may report side effects to FDA at 1-800-FDA-1088. Where should I keep my medicine? This drug is given in a hospital or clinic and will not be stored at  home. NOTE: This sheet is a summary. It may not cover all possible information. If you have questions about this medicine, talk to your doctor, pharmacist, or health care provider.  2018 Elsevier/Gold Standard (2011-12-25 16:22:58)  

## 2017-10-19 LAB — KAPPA/LAMBDA LIGHT CHAINS
Kappa free light chain: 27.4 mg/L — ABNORMAL HIGH (ref 3.3–19.4)
Kappa, lambda light chain ratio: 1.64 (ref 0.26–1.65)
Lambda free light chains: 16.7 mg/L (ref 5.7–26.3)

## 2017-10-19 LAB — TESTOSTERONE: Testosterone: 657 ng/dL (ref 264–916)

## 2017-10-19 LAB — IGG, IGA, IGM
IGA: 157 mg/dL (ref 61–437)
IGG (IMMUNOGLOBIN G), SERUM: 901 mg/dL (ref 700–1600)
IgM (Immunoglobulin M), Srm: 28 mg/dL (ref 20–172)

## 2017-10-21 LAB — PROTEIN ELECTROPHORESIS, SERUM, WITH REFLEX
A/G RATIO SPE: 1.2 (ref 0.7–1.7)
ALPHA-2-GLOBULIN: 1.1 g/dL — AB (ref 0.4–1.0)
Albumin ELP: 3.6 g/dL (ref 2.9–4.4)
Alpha-1-Globulin: 0.2 g/dL (ref 0.0–0.4)
BETA GLOBULIN: 1 g/dL (ref 0.7–1.3)
GAMMA GLOBULIN: 0.7 g/dL (ref 0.4–1.8)
GLOBULIN, TOTAL: 3 g/dL (ref 2.2–3.9)
M-SPIKE, %: 0.3 g/dL — AB
SPEP INTERP: 0
Total Protein ELP: 6.6 g/dL (ref 6.0–8.5)

## 2017-10-21 LAB — IMMUNOFIXATION REFLEX, SERUM
IGG (IMMUNOGLOBIN G), SERUM: 947 mg/dL (ref 700–1600)
IgA: 170 mg/dL (ref 61–437)
IgM (Immunoglobulin M), Srm: 26 mg/dL (ref 20–172)

## 2017-11-01 ENCOUNTER — Other Ambulatory Visit: Payer: Self-pay

## 2017-11-01 ENCOUNTER — Inpatient Hospital Stay: Payer: Medicare Other | Attending: Hematology & Oncology | Admitting: Family

## 2017-11-01 ENCOUNTER — Inpatient Hospital Stay: Payer: Medicare Other

## 2017-11-01 VITALS — BP 166/75 | HR 102 | Temp 98.3°F | Wt 198.1 lb

## 2017-11-01 VITALS — HR 82

## 2017-11-01 DIAGNOSIS — M4712 Other spondylosis with myelopathy, cervical region: Secondary | ICD-10-CM

## 2017-11-01 DIAGNOSIS — Z299 Encounter for prophylactic measures, unspecified: Secondary | ICD-10-CM

## 2017-11-01 DIAGNOSIS — N189 Chronic kidney disease, unspecified: Secondary | ICD-10-CM | POA: Diagnosis not present

## 2017-11-01 DIAGNOSIS — C9000 Multiple myeloma not having achieved remission: Secondary | ICD-10-CM

## 2017-11-01 DIAGNOSIS — D509 Iron deficiency anemia, unspecified: Secondary | ICD-10-CM

## 2017-11-01 DIAGNOSIS — Z5111 Encounter for antineoplastic chemotherapy: Secondary | ICD-10-CM | POA: Diagnosis present

## 2017-11-01 DIAGNOSIS — E1140 Type 2 diabetes mellitus with diabetic neuropathy, unspecified: Secondary | ICD-10-CM | POA: Insufficient documentation

## 2017-11-01 DIAGNOSIS — Z5112 Encounter for antineoplastic immunotherapy: Secondary | ICD-10-CM | POA: Insufficient documentation

## 2017-11-01 DIAGNOSIS — D631 Anemia in chronic kidney disease: Secondary | ICD-10-CM | POA: Diagnosis not present

## 2017-11-01 DIAGNOSIS — Z23 Encounter for immunization: Secondary | ICD-10-CM | POA: Diagnosis not present

## 2017-11-01 DIAGNOSIS — D500 Iron deficiency anemia secondary to blood loss (chronic): Secondary | ICD-10-CM

## 2017-11-01 DIAGNOSIS — E349 Endocrine disorder, unspecified: Secondary | ICD-10-CM

## 2017-11-01 LAB — CBC WITH DIFFERENTIAL (CANCER CENTER ONLY)
Basophils Absolute: 0.1 10*3/uL (ref 0.0–0.1)
Basophils Relative: 1 %
Eosinophils Absolute: 0.2 10*3/uL (ref 0.0–0.5)
Eosinophils Relative: 3 %
HEMATOCRIT: 31.8 % — AB (ref 38.7–49.9)
HEMOGLOBIN: 10.3 g/dL — AB (ref 13.0–17.1)
LYMPHS ABS: 0.9 10*3/uL (ref 0.9–3.3)
Lymphocytes Relative: 17 %
MCH: 26.3 pg — AB (ref 28.0–33.4)
MCHC: 32.4 g/dL (ref 32.0–35.9)
MCV: 81.3 fL — ABNORMAL LOW (ref 82.0–98.0)
MONOS PCT: 11 %
Monocytes Absolute: 0.6 10*3/uL (ref 0.1–0.9)
NEUTROS ABS: 3.5 10*3/uL (ref 1.5–6.5)
NEUTROS PCT: 68 %
Platelet Count: 258 10*3/uL (ref 145–400)
RBC: 3.91 MIL/uL — ABNORMAL LOW (ref 4.20–5.70)
RDW: 15.8 % — AB (ref 11.1–15.7)
WBC Count: 5.2 10*3/uL (ref 4.0–10.0)

## 2017-11-01 LAB — CMP (CANCER CENTER ONLY)
ALK PHOS: 73 U/L (ref 26–84)
ALT: 47 U/L (ref 10–47)
ANION GAP: 1 — AB (ref 5–15)
AST: 29 U/L (ref 11–38)
Albumin: 3.4 g/dL — ABNORMAL LOW (ref 3.5–5.0)
BUN: 14 mg/dL (ref 7–22)
CHLORIDE: 111 mmol/L — AB (ref 98–108)
CO2: 28 mmol/L (ref 18–33)
Calcium: 9.1 mg/dL (ref 8.0–10.3)
Creatinine: 2.1 mg/dL — ABNORMAL HIGH (ref 0.60–1.20)
Glucose, Bld: 214 mg/dL — ABNORMAL HIGH (ref 73–118)
POTASSIUM: 3.7 mmol/L (ref 3.3–4.7)
Sodium: 140 mmol/L (ref 128–145)
Total Bilirubin: 0.4 mg/dL (ref 0.2–1.6)
Total Protein: 7.1 g/dL (ref 6.4–8.1)

## 2017-11-01 MED ORDER — SODIUM CHLORIDE 0.9 % IV SOLN
Freq: Once | INTRAVENOUS | Status: AC
  Administered 2017-11-01: 09:00:00 via INTRAVENOUS
  Filled 2017-11-01: qty 250

## 2017-11-01 MED ORDER — SODIUM CHLORIDE 0.9 % IV SOLN
60.0000 mg | Freq: Once | INTRAVENOUS | Status: AC
  Administered 2017-11-01: 60 mg via INTRAVENOUS
  Filled 2017-11-01: qty 20
  Filled 2017-11-01: qty 10

## 2017-11-01 MED ORDER — SODIUM CHLORIDE 0.9% FLUSH
10.0000 mL | INTRAVENOUS | Status: DC | PRN
  Administered 2017-11-01: 10 mL
  Filled 2017-11-01: qty 10

## 2017-11-01 MED ORDER — HYDROMORPHONE HCL 2 MG/ML IJ SOLN
2.0000 mg | Freq: Once | INTRAMUSCULAR | Status: AC
  Administered 2017-11-01: 2 mg via INTRAVENOUS

## 2017-11-01 MED ORDER — DEXAMETHASONE SODIUM PHOSPHATE 10 MG/ML IJ SOLN
INTRAMUSCULAR | Status: AC
  Filled 2017-11-01: qty 1

## 2017-11-01 MED ORDER — PALONOSETRON HCL INJECTION 0.25 MG/5ML
INTRAVENOUS | Status: AC
  Filled 2017-11-01: qty 5

## 2017-11-01 MED ORDER — HYDROMORPHONE HCL 2 MG/ML IJ SOLN
INTRAMUSCULAR | Status: AC
  Filled 2017-11-01: qty 1

## 2017-11-01 MED ORDER — DEXAMETHASONE SODIUM PHOSPHATE 10 MG/ML IJ SOLN
10.0000 mg | Freq: Once | INTRAMUSCULAR | Status: AC
  Administered 2017-11-01: 10 mg via INTRAVENOUS

## 2017-11-01 MED ORDER — PALONOSETRON HCL INJECTION 0.25 MG/5ML
0.2500 mg | Freq: Once | INTRAVENOUS | Status: AC
  Administered 2017-11-01: 0.25 mg via INTRAVENOUS

## 2017-11-01 MED ORDER — HEPARIN SOD (PORK) LOCK FLUSH 100 UNIT/ML IV SOLN
500.0000 [IU] | Freq: Once | INTRAVENOUS | Status: AC | PRN
  Administered 2017-11-01: 500 [IU]
  Filled 2017-11-01: qty 5

## 2017-11-01 MED ORDER — SODIUM CHLORIDE 0.9 % IV SOLN
300.0000 mg/m2 | Freq: Once | INTRAVENOUS | Status: AC
  Administered 2017-11-01: 620 mg via INTRAVENOUS
  Filled 2017-11-01: qty 31

## 2017-11-01 MED ORDER — DEXTROSE 5 % IV SOLN
29.0000 mg/m2 | Freq: Once | INTRAVENOUS | Status: AC
  Administered 2017-11-01: 60 mg via INTRAVENOUS
  Filled 2017-11-01: qty 30

## 2017-11-01 NOTE — Patient Instructions (Signed)
Implanted Port Insertion, Care After °This sheet gives you information about how to care for yourself after your procedure. Your health care provider may also give you more specific instructions. If you have problems or questions, contact your health care provider. °What can I expect after the procedure? °After your procedure, it is common to have: °· Discomfort at the port insertion site. °· Bruising on the skin over the port. This should improve over 3-4 days. ° °Follow these instructions at home: °Port care °· After your port is placed, you will get a manufacturer's information card. The card has information about your port. Keep this card with you at all times. °· Take care of the port as told by your health care provider. Ask your health care provider if you or a family member can get training for taking care of the port at home. A home health care nurse may also take care of the port. °· Make sure to remember what type of port you have. °Incision care °· Follow instructions from your health care provider about how to take care of your port insertion site. Make sure you: °? Wash your hands with soap and water before you change your bandage (dressing). If soap and water are not available, use hand sanitizer. °? Change your dressing as told by your health care provider. °? Leave stitches (sutures), skin glue, or adhesive strips in place. These skin closures may need to stay in place for 2 weeks or longer. If adhesive strip edges start to loosen and curl up, you may trim the loose edges. Do not remove adhesive strips completely unless your health care provider tells you to do that. °· Check your port insertion site every day for signs of infection. Check for: °? More redness, swelling, or pain. °? More fluid or blood. °? Warmth. °? Pus or a bad smell. °General instructions °· Do not take baths, swim, or use a hot tub until your health care provider approves. °· Do not lift anything that is heavier than 10 lb (4.5  kg) for a week, or as told by your health care provider. °· Ask your health care provider when it is okay to: °? Return to work or school. °? Resume usual physical activities or sports. °· Do not drive for 24 hours if you were given a medicine to help you relax (sedative). °· Take over-the-counter and prescription medicines only as told by your health care provider. °· Wear a medical alert bracelet in case of an emergency. This will tell any health care providers that you have a port. °· Keep all follow-up visits as told by your health care provider. This is important. °Contact a health care provider if: °· You cannot flush your port with saline as directed, or you cannot draw blood from the port. °· You have a fever or chills. °· You have more redness, swelling, or pain around your port insertion site. °· You have more fluid or blood coming from your port insertion site. °· Your port insertion site feels warm to the touch. °· You have pus or a bad smell coming from the port insertion site. °Get help right away if: °· You have chest pain or shortness of breath. °· You have bleeding from your port that you cannot control. °Summary °· Take care of the port as told by your health care provider. °· Change your dressing as told by your health care provider. °· Keep all follow-up visits as told by your health care provider. °  This information is not intended to replace advice given to you by your health care provider. Make sure you discuss any questions you have with your health care provider. °Document Released: 11/30/2012 Document Revised: 01/01/2016 Document Reviewed: 01/01/2016 °Elsevier Interactive Patient Education © 2017 [employer] ° °

## 2017-11-01 NOTE — Progress Notes (Signed)
Hematology and Oncology Follow Up Visit  [name] [id number] 1950/12/16 67 y.o. 11/01/2017   Principle Diagnosis:  IgG kappa myeloma Anemia secondary to renal insufficiency Intermittent iron - deficiency anemia Hypotestosteronemia  Current Therapy:   Aredia 60 mg IV q 2 months-- next dose 10/2017 Aranesp 300 mcg subcu as needed for hemoglobin less than 11 DepoTestosterone400 mg q 4 weeks IV iron as indicated Kyprolis/Cytoxan - s/p cycle10   Interim History:  [name] is here today for follow-up and treatment. He was sick last week for 3 days with what sounds like a stomach virus with n/v/d and some dizziness. His symptoms have since resolved. He had been to a family reunion and is unsure if he was exposed to anyone else that may have been sick.  His symptoms have since resolved but he is still a bit fatigue.  In August, M-spike was 0.3, kappa light chains 2.74 mg/dL and IgG level 901 mg/dL.  No fever, chills, cough, rash, headache, blurred vision, SOB, chest pain, palpitations, abdominal pain or changes in bladder habits.  No episodes of bleeding, no bruising or petechiae.  His lower back and joints in his hands are still painful and states he is followed closely by orthopedics.  The numbness and tingling in his hands and feet waxes and wanes.  He has maintained a good appetite and is staying well hydrated. His weight is stable.   ECOG Performance Status: 1 - Symptomatic but completely ambulatory  Medications:  Allergies as of 11/01/2017      Reactions   Iodinated Diagnostic Agents Rash   Patient states he was instructed not to take IV contrast.  In 2008 he had an unknown reaction, and was told not to take it again.  He was also told not to take it due to his kidneys.   Iodine Anxiety, Rash, Other (See Comments)   Didn't feel right "instructed not to take per MD--something with his port" Didn't feel right Didn't feel right "instructed not to take per MD--something  with his port"      Medication List        Accurate as of 11/01/17 10:30 AM. Always use your most recent med list.          amLODipine 10 MG tablet Commonly known as:  NORVASC Take 10 mg by mouth daily.   CARAFATE 1 GM/10ML suspension Generic drug:  sucralfate Take 1 g by mouth 4 (four) times daily.   cefdinir 300 MG capsule Commonly known as:  OMNICEF Take 1 capsule (300 mg total) by mouth 2 (two) times daily.   ciclopirox 8 % solution Commonly known as:  PENLAC APP QD. REMOVE WEEKLY WITH NAIL POLISH REMOVER THEN REAPPLY   CLEAR EYES ALL SEASONS 5-6 MG/ML Soln Generic drug:  Polyvinyl Alcohol-Povidone Place 2 drops into both eyes 3 (three) times daily as needed (drye eyes.).   dicyclomine 10 MG capsule Commonly known as:  BENTYL Take 1 capsule (10 mg total) by mouth 3 (three) times daily.   dronabinol 2.5 MG capsule Commonly known as:  MARINOL Take 1 capsule (2.5 mg total) by mouth 2 (two) times daily before a meal.   EMBEDA 30-1.2 MG Cpcr Generic drug:  Morphine-Naltrexone Take 3 (three) times daily by mouth.   furosemide 40 MG tablet Commonly known as:  LASIX Take 40 mg by mouth.   glipiZIDE 5 MG tablet Commonly known as:  GLUCOTROL Take 5 mg by mouth 2 (two) times daily before a meal.   isosorbide mononitrate  30 MG 24 hr tablet Commonly known as:  IMDUR Take 30 mg by mouth daily.   latanoprost 0.005 % ophthalmic solution Commonly known as:  XALATAN Place 1 drop into the right eye every evening.   lidocaine-prilocaine cream Commonly known as:  EMLA Apply 1 application topically as needed.   linaclotide 145 MCG Caps capsule Commonly known as:  LINZESS Take 1 capsule (145 mcg total) by mouth daily before breakfast.   LORazepam 0.5 MG tablet Commonly known as:  ATIVAN Take 1 tablet (0.5 mg total) by mouth every 6 (six) hours as needed (Nausea or vomiting).   losartan 25 MG tablet Commonly known as:  COZAAR Take 25 mg by mouth.   meclizine 50 MG  tablet Commonly known as:  ANTIVERT Take 0.5 tablets (25 mg total) by mouth 4 (four) times daily as needed for dizziness.   methocarbamol 500 MG tablet Commonly known as:  ROBAXIN Take 500 mg by mouth 4 (four) times daily.   mirabegron ER 50 MG Tb24 tablet Commonly known as:  MYRBETRIQ Take 50 mg by mouth daily.   omeprazole 40 MG capsule Commonly known as:  PRILOSEC take 1 capsule by mouth twice a day   ondansetron 8 MG tablet Commonly known as:  ZOFRAN TAKE 1 TABLET(8 MG) BY MOUTH TWICE DAILY AS NEEDED FOR NAUSEA OR VOMITING   orphenadrine 100 MG tablet Commonly known as:  NORFLEX Take 1 tablet (100 mg total) by mouth 2 (two) times daily.   oxyCODONE 20 mg 12 hr tablet Commonly known as:  OXYCONTIN Take 1 tablet (20 mg total) by mouth every 12 (twelve) hours.   oxyCODONE-acetaminophen 10-325 MG tablet Commonly known as:  PERCOCET Take 1 tablet by mouth every 6 (six) hours as needed for pain.   potassium chloride SA 20 MEQ tablet Commonly known as:  K-DUR,KLOR-CON Take 1 tablet (20 mEq total) by mouth 2 (two) times daily.   prochlorperazine 10 MG tablet Commonly known as:  COMPAZINE take 1 tablet by mouth every 6 hours if needed for nausea and vomiting   TOPROL XL 50 MG 24 hr tablet Generic drug:  metoprolol succinate Take 50 mg by mouth daily.   traMADol 50 MG tablet Commonly known as:  ULTRAM TAKE 1 TABLET(50 MG) BY MOUTH EVERY 6 HOURS AS NEEDED   Vitamin D (Ergocalciferol) 50000 units Caps capsule Commonly known as:  DRISDOL Take 50,000 Units by mouth every Saturday.   ZIOPTAN 0.0015 % Soln Generic drug:  Tafluprost Place 1 drop into both eyes daily.       Allergies:  Allergies  Allergen Reactions  . Iodinated Diagnostic Agents Rash    Patient states he was instructed not to take IV contrast.  In 2008 he had an unknown reaction, and was told not to take it again.  He was also told not to take it due to his kidneys.  . Iodine Anxiety, Rash and Other  (See Comments)    Didn't feel right "instructed not to take per MD--something with his port" Didn't feel right Didn't feel right "instructed not to take per MD--something with his port"    Past Medical History, Surgical history, Social history, and Family History were reviewed and updated.  Review of Systems: All other 10 point review of systems is negative.   Physical Exam:  weight is 198 lb 1.6 oz (89.9 kg). His oral temperature is 98.3 F (36.8 C). His blood pressure is 166/75 (abnormal) and his pulse is 102 (abnormal). His oxygen saturation is 97%.  Wt Readings from Last 3 Encounters:  11/01/17 198 lb 1.6 oz (89.9 kg)  10/18/17 197 lb (89.4 kg)  10/11/17 200 lb (90.7 kg)    Ocular: Sclerae unicteric, pupils equal, round and reactive to light Ear-nose-throat: Oropharynx clear, dentition fair Lymphatic: No cervical, supraclavicular or axillary adenopathy Lungs no rales or rhonchi, good excursion bilaterally Heart regular rate and rhythm, no murmur appreciated Abd soft, nontender, positive bowel sounds, no liver or spleen tip palpated on exam, no fluid wave  MSK no focal spinal tenderness, no joint edema Neuro: non-focal, well-oriented, appropriate affect Breasts: Deferred   Lab Results  Component Value Date   WBC 5.2 11/01/2017   HGB 10.3 (L) 11/01/2017   HCT 31.8 (L) 11/01/2017   MCV 81.3 (L) 11/01/2017   PLT 258 11/01/2017   Lab Results  Component Value Date   FERRITIN 1,855 (H) 10/18/2017   IRON 57 10/18/2017   TIBC 230 10/18/2017   UIBC 173 10/18/2017   IRONPCTSAT 25 (L) 10/18/2017   Lab Results  Component Value Date   RETICCTPCT 1.1 10/04/2017   RBC 3.91 (L) 11/01/2017   RETICCTABS 27.5 03/21/2014   Lab Results  Component Value Date   KPAFRELGTCHN 27.4 (H) 10/18/2017   LAMBDASER 16.7 10/18/2017   KAPLAMBRATIO 1.64 10/18/2017   Lab Results  Component Value Date   IGGSERUM 901 10/18/2017   IGGSERUM 947 10/18/2017   IGA 157 10/18/2017   IGA 170  10/18/2017   IGMSERUM 28 10/18/2017   IGMSERUM 26 10/18/2017   Lab Results  Component Value Date   TOTALPROTELP 6.6 10/18/2017   ALBUMINELP 3.6 10/18/2017   A1GS 0.2 10/18/2017   A2GS 1.1 (H) 10/18/2017   BETS 1.0 10/18/2017   BETA2SER 0.5 11/07/2014   GAMS 0.7 10/18/2017   MSPIKE 0.3 (H) 10/18/2017   SPEI Comment 10/04/2017     Chemistry      Component Value Date/Time   NA 140 11/01/2017 0810   NA 143 02/22/2017 0744   K 3.7 11/01/2017 0810   K 4.0 02/22/2017 0744   CL 111 (H) 11/01/2017 0810   CL 106 02/22/2017 0744   CO2 28 11/01/2017 0810   CO2 27 02/22/2017 0744   BUN 14 11/01/2017 0810   BUN 26 (H) 02/22/2017 0744   CREATININE 2.10 (H) 11/01/2017 0810   CREATININE 2.3 (H) 02/22/2017 0744      Component Value Date/Time   CALCIUM 9.1 11/01/2017 0810   CALCIUM 9.3 02/22/2017 0744   ALKPHOS 73 11/01/2017 0810   ALKPHOS 80 02/22/2017 0744   AST 29 11/01/2017 0810   ALT 47 11/01/2017 0810   ALT 22 02/22/2017 0744   BILITOT 0.4 11/01/2017 0810      Impression and Plan: Ms. Brod is a very pleasant 67 yo African American gentleman with IgG kappa myeloma. He continues to do well and mis counts have remained stable.  We will proceed with treatment today as planned.  He has his current treatment and appointment schedule and will follow-up.  He will contact our office with any questions or concerns. We can certainly see him sooner if need be.   Laverna Peace, NP 9/9/201910:30 AM

## 2017-11-01 NOTE — Patient Instructions (Signed)
Pamidronate injection What is this medicine? PAMIDRONATE (pa mi DROE nate) slows calcium loss from bones. It is used to treat high calcium blood levels from cancer or Paget's disease. It is also used to treat bone pain and prevent fractures from certain cancers that have spread to the bone. This medicine may be used for other purposes; ask your health care provider or pharmacist if you have questions. COMMON BRAND NAME(S): Aredia What should I tell my health care provider before I take this medicine? They need to know if you have any of these conditions: -aspirin-sensitive asthma -dental disease -kidney disease -an unusual or allergic reaction to pamidronate, other medicines, foods, dyes, or preservatives -pregnant or trying to get pregnant -breast-feeding How should I use this medicine? This medicine is for infusion into a vein. It is given by a health care professional in a hospital or clinic setting. Talk to your pediatrician regarding the use of this medicine in children. This medicine is not approved for use in children. Overdosage: If you think you have taken too much of this medicine contact a poison control center or emergency room at once. NOTE: This medicine is only for you. Do not share this medicine with others. What if I miss a dose? This does not apply. What may interact with this medicine? -certain antibiotics given by injection -medicines for inflammation or pain like ibuprofen, naproxen -some diuretics like bumetanide, furosemide -cyclosporine -parathyroid hormone -tacrolimus -teriparatide -thalidomide This list may not describe all possible interactions. Give your health care provider a list of all the medicines, herbs, non-prescription drugs, or dietary supplements you use. Also tell them if you smoke, drink alcohol, or use illegal drugs. Some items may interact with your medicine. What should I watch for while using this medicine? Visit your doctor or health care  professional for regular checkups. It may be some time before you see the benefit from this medicine. Do not stop taking your medicine unless your doctor tells you to. Your doctor may order blood tests or other tests to see how you are doing. Women should inform their doctor if they wish to become pregnant or think they might be pregnant. There is a potential for serious side effects to an unborn child. Talk to your health care professional or pharmacist for more information. You should make sure that you get enough calcium and vitamin D while you are taking this medicine. Discuss the foods you eat and the vitamins you take with your health care professional. Some people who take this medicine have severe bone, joint, and/or muscle pain. This medicine may also increase your risk for a broken thigh bone. Tell your doctor right away if you have pain in your upper leg or groin. Tell your doctor if you have any pain that does not go away or that gets worse. What side effects may I notice from receiving this medicine? Side effects that you should report to your doctor or health care professional as soon as possible: -allergic reactions like skin rash, itching or hives, swelling of the face, lips, or tongue -black or tarry stools -changes in vision -eye inflammation, pain -high blood pressure -jaw pain, especially burning or cramping -muscle weakness -numb, tingling pain -swelling of feet or hands -trouble passing urine or change in the amount of urine -unable to move easily Side effects that usually do not require medical attention (report to your doctor or health care professional if they continue or are bothersome): -bone, joint, or muscle pain -constipation -dizzy, drowsy -  fever -headache -loss of appetite -nausea, vomiting -pain at site where injected This list may not describe all possible side effects. Call your doctor for medical advice about side effects. You may report side effects to  FDA at 1-800-FDA-1088. Where should I keep my medicine? This drug is given in a hospital or clinic and will not be stored at home. NOTE: This sheet is a summary. It may not cover all possible information. If you have questions about this medicine, talk to your doctor, pharmacist, or health care provider.  2018 Elsevier/Gold Standard (2010-08-08 08:49:49)  

## 2017-11-08 ENCOUNTER — Other Ambulatory Visit: Payer: Self-pay

## 2017-11-08 ENCOUNTER — Inpatient Hospital Stay: Payer: Medicare Other

## 2017-11-08 ENCOUNTER — Encounter: Payer: Self-pay | Admitting: Hematology & Oncology

## 2017-11-08 ENCOUNTER — Inpatient Hospital Stay (HOSPITAL_BASED_OUTPATIENT_CLINIC_OR_DEPARTMENT_OTHER): Payer: Medicare Other | Admitting: Hematology & Oncology

## 2017-11-08 VITALS — BP 154/72 | HR 97 | Temp 98.5°F | Resp 18 | Wt 197.0 lb

## 2017-11-08 DIAGNOSIS — D631 Anemia in chronic kidney disease: Secondary | ICD-10-CM

## 2017-11-08 DIAGNOSIS — M4712 Other spondylosis with myelopathy, cervical region: Secondary | ICD-10-CM

## 2017-11-08 DIAGNOSIS — E1140 Type 2 diabetes mellitus with diabetic neuropathy, unspecified: Secondary | ICD-10-CM

## 2017-11-08 DIAGNOSIS — C9000 Multiple myeloma not having achieved remission: Secondary | ICD-10-CM

## 2017-11-08 DIAGNOSIS — N189 Chronic kidney disease, unspecified: Secondary | ICD-10-CM

## 2017-11-08 DIAGNOSIS — D508 Other iron deficiency anemias: Secondary | ICD-10-CM

## 2017-11-08 DIAGNOSIS — E349 Endocrine disorder, unspecified: Secondary | ICD-10-CM

## 2017-11-08 DIAGNOSIS — Z5112 Encounter for antineoplastic immunotherapy: Secondary | ICD-10-CM | POA: Diagnosis not present

## 2017-11-08 DIAGNOSIS — D509 Iron deficiency anemia, unspecified: Secondary | ICD-10-CM | POA: Diagnosis not present

## 2017-11-08 DIAGNOSIS — Z299 Encounter for prophylactic measures, unspecified: Secondary | ICD-10-CM

## 2017-11-08 DIAGNOSIS — Z23 Encounter for immunization: Secondary | ICD-10-CM

## 2017-11-08 LAB — CMP (CANCER CENTER ONLY)
ALBUMIN: 3.5 g/dL (ref 3.5–5.0)
ALT: 46 U/L (ref 10–47)
AST: 22 U/L (ref 11–38)
Alkaline Phosphatase: 91 U/L — ABNORMAL HIGH (ref 26–84)
Anion gap: 6 (ref 5–15)
BUN: 28 mg/dL — AB (ref 7–22)
CHLORIDE: 108 mmol/L (ref 98–108)
CO2: 29 mmol/L (ref 18–33)
CREATININE: 2.1 mg/dL — AB (ref 0.60–1.20)
Calcium: 9.2 mg/dL (ref 8.0–10.3)
Glucose, Bld: 235 mg/dL — ABNORMAL HIGH (ref 73–118)
POTASSIUM: 4.2 mmol/L (ref 3.3–4.7)
Sodium: 143 mmol/L (ref 128–145)
Total Bilirubin: 0.5 mg/dL (ref 0.2–1.6)
Total Protein: 7.1 g/dL (ref 6.4–8.1)

## 2017-11-08 LAB — CBC WITH DIFFERENTIAL (CANCER CENTER ONLY)
BASOS PCT: 1 %
Basophils Absolute: 0 10*3/uL (ref 0.0–0.1)
Eosinophils Absolute: 0.3 10*3/uL (ref 0.0–0.5)
Eosinophils Relative: 5 %
HCT: 32.7 % — ABNORMAL LOW (ref 38.7–49.9)
HEMOGLOBIN: 10.6 g/dL — AB (ref 13.0–17.1)
Lymphocytes Relative: 13 %
Lymphs Abs: 0.8 10*3/uL — ABNORMAL LOW (ref 0.9–3.3)
MCH: 26.1 pg — AB (ref 28.0–33.4)
MCHC: 32.4 g/dL (ref 32.0–35.9)
MCV: 80.5 fL — ABNORMAL LOW (ref 82.0–98.0)
MONO ABS: 0.8 10*3/uL (ref 0.1–0.9)
MONOS PCT: 13 %
NEUTROS PCT: 68 %
Neutro Abs: 4.4 10*3/uL (ref 1.5–6.5)
PLATELETS: 225 10*3/uL (ref 145–400)
RBC: 4.06 MIL/uL — ABNORMAL LOW (ref 4.20–5.70)
RDW: 16.1 % — AB (ref 11.1–15.7)
WBC Count: 6.3 10*3/uL (ref 4.0–10.0)

## 2017-11-08 MED ORDER — PALONOSETRON HCL INJECTION 0.25 MG/5ML
0.2500 mg | Freq: Once | INTRAVENOUS | Status: AC
  Administered 2017-11-08: 0.25 mg via INTRAVENOUS

## 2017-11-08 MED ORDER — INFLUENZA VAC SPLIT QUAD 0.5 ML IM SUSY
PREFILLED_SYRINGE | INTRAMUSCULAR | Status: AC
  Filled 2017-11-08: qty 0.5

## 2017-11-08 MED ORDER — PALONOSETRON HCL INJECTION 0.25 MG/5ML
INTRAVENOUS | Status: AC
  Filled 2017-11-08: qty 5

## 2017-11-08 MED ORDER — SODIUM CHLORIDE 0.9 % IJ SOLN
10.0000 mL | Freq: Once | INTRAMUSCULAR | Status: AC
  Administered 2017-11-08: 10 mL
  Filled 2017-11-08: qty 10

## 2017-11-08 MED ORDER — TESTOSTERONE CYPIONATE 200 MG/ML IM SOLN
400.0000 mg | Freq: Once | INTRAMUSCULAR | Status: AC
  Administered 2017-11-08: 400 mg via INTRAMUSCULAR

## 2017-11-08 MED ORDER — DEXAMETHASONE SODIUM PHOSPHATE 10 MG/ML IJ SOLN
10.0000 mg | Freq: Once | INTRAMUSCULAR | Status: AC
  Administered 2017-11-08: 10 mg via INTRAVENOUS

## 2017-11-08 MED ORDER — SODIUM CHLORIDE 0.9 % IV SOLN
300.0000 mg/m2 | Freq: Once | INTRAVENOUS | Status: AC
  Administered 2017-11-08: 620 mg via INTRAVENOUS
  Filled 2017-11-08: qty 31

## 2017-11-08 MED ORDER — HYDROMORPHONE HCL 2 MG/ML IJ SOLN
2.0000 mg | Freq: Once | INTRAMUSCULAR | Status: AC
  Administered 2017-11-08: 2 mg via INTRAVENOUS

## 2017-11-08 MED ORDER — SODIUM CHLORIDE 0.9 % IV SOLN
Freq: Once | INTRAVENOUS | Status: AC
  Administered 2017-11-08: 09:00:00 via INTRAVENOUS
  Filled 2017-11-08: qty 250

## 2017-11-08 MED ORDER — DARBEPOETIN ALFA 300 MCG/0.6ML IJ SOSY
PREFILLED_SYRINGE | INTRAMUSCULAR | Status: AC
  Filled 2017-11-08: qty 0.6

## 2017-11-08 MED ORDER — DARBEPOETIN ALFA 300 MCG/0.6ML IJ SOSY
300.0000 ug | PREFILLED_SYRINGE | Freq: Once | INTRAMUSCULAR | Status: AC
  Administered 2017-11-08: 300 ug via SUBCUTANEOUS

## 2017-11-08 MED ORDER — INFLUENZA VAC SPLIT QUAD 0.5 ML IM SUSY
0.5000 mL | PREFILLED_SYRINGE | Freq: Once | INTRAMUSCULAR | Status: AC
  Administered 2017-11-08: 0.5 mL via INTRAMUSCULAR

## 2017-11-08 MED ORDER — DEXTROSE 5 % IV SOLN
29.0000 mg/m2 | Freq: Once | INTRAVENOUS | Status: AC
  Administered 2017-11-08: 60 mg via INTRAVENOUS
  Filled 2017-11-08: qty 30

## 2017-11-08 MED ORDER — HYDROMORPHONE HCL 2 MG/ML IJ SOLN
INTRAMUSCULAR | Status: AC
  Filled 2017-11-08: qty 1

## 2017-11-08 MED ORDER — TESTOSTERONE CYPIONATE 200 MG/ML IM SOLN
INTRAMUSCULAR | Status: AC
  Filled 2017-11-08: qty 2

## 2017-11-08 MED ORDER — HEPARIN SOD (PORK) LOCK FLUSH 100 UNIT/ML IV SOLN
500.0000 [IU] | Freq: Once | INTRAVENOUS | Status: AC | PRN
  Administered 2017-11-08: 500 [IU]
  Filled 2017-11-08: qty 5

## 2017-11-08 MED ORDER — DEXAMETHASONE SODIUM PHOSPHATE 10 MG/ML IJ SOLN
INTRAMUSCULAR | Status: AC
  Filled 2017-11-08: qty 1

## 2017-11-08 MED ORDER — SODIUM CHLORIDE 0.9% FLUSH
10.0000 mL | INTRAVENOUS | Status: DC | PRN
  Administered 2017-11-08: 10 mL
  Filled 2017-11-08: qty 10

## 2017-11-08 NOTE — Patient Instructions (Signed)
Implanted Port Home Guide An implanted port is a type of central line that is placed under the skin. Central lines are used to provide IV access when treatment or nutrition needs to be given through a person's veins. Implanted ports are used for long-term IV access. An implanted port may be placed because:  You need IV medicine that would be irritating to the small veins in your hands or arms.  You need long-term IV medicines, such as antibiotics.  You need IV nutrition for a long period.  You need frequent blood draws for lab tests.  You need dialysis.  Implanted ports are usually placed in the chest area, but they can also be placed in the upper arm, the abdomen, or the leg. An implanted port has two main parts:  Reservoir. The reservoir is round and will appear as a small, raised area under your skin. The reservoir is the part where a needle is inserted to give medicines or draw blood.  Catheter. The catheter is a thin, flexible tube that extends from the reservoir. The catheter is placed into a large vein. Medicine that is inserted into the reservoir goes into the catheter and then into the vein.  How will I care for my incision site? Do not get the incision site wet. Bathe or shower as directed by your health care provider. How is my port accessed? Special steps must be taken to access the port:  Before the port is accessed, a numbing cream can be placed on the skin. This helps numb the skin over the port site.  Your health care provider uses a sterile technique to access the port. ? Your health care provider must put on a mask and sterile gloves. ? The skin over your port is cleaned carefully with an antiseptic and allowed to dry. ? The port is gently pinched between sterile gloves, and a needle is inserted into the port.  Only "non-coring" port needles should be used to access the port. Once the port is accessed, a blood return should be checked. This helps ensure that the port  is in the vein and is not clogged.  If your port needs to remain accessed for a constant infusion, a clear (transparent) bandage will be placed over the needle site. The bandage and needle will need to be changed every week, or as directed by your health care provider.  Keep the bandage covering the needle clean and dry. Do not get it wet. Follow your health care provider's instructions on how to take a shower or bath while the port is accessed.  If your port does not need to stay accessed, no bandage is needed over the port.  What is flushing? Flushing helps keep the port from getting clogged. Follow your health care provider's instructions on how and when to flush the port. Ports are usually flushed with saline solution or a medicine called heparin. The need for flushing will depend on how the port is used.  If the port is used for intermittent medicines or blood draws, the port will need to be flushed: ? After medicines have been given. ? After blood has been drawn. ? As part of routine maintenance.  If a constant infusion is running, the port may not need to be flushed.  How long will my port stay implanted? The port can stay in for as long as your health care provider thinks it is needed. When it is time for the port to come out, surgery will be   done to remove it. The procedure is similar to the one performed when the port was put in. When should I seek immediate medical care? When you have an implanted port, you should seek immediate medical care if:  You notice a bad smell coming from the incision site.  You have swelling, redness, or drainage at the incision site.  You have more swelling or pain at the port site or the surrounding area.  You have a fever that is not controlled with medicine.  This information is not intended to replace advice given to you by your health care provider. Make sure you discuss any questions you have with your health care provider. Document  Released: 02/09/2005 Document Revised: 07/18/2015 Document Reviewed: 10/17/2012 Elsevier Interactive Patient Education  2017 [employer]  

## 2017-11-08 NOTE — Patient Instructions (Signed)
[facility] Discharge Instructions for Patients Receiving Chemotherapy  Today you received the following chemotherapy agents:  Kyprolis and Cytoxan  To help prevent nausea and vomiting after your treatment, we encourage you to take your nausea medication as ordered per MD.    If you develop nausea and vomiting that is not controlled by your nausea medication, call the clinic.   BELOW ARE SYMPTOMS THAT SHOULD BE REPORTED IMMEDIATELY:  *FEVER GREATER THAN 100.5 F  *CHILLS WITH OR WITHOUT FEVER  NAUSEA AND VOMITING THAT IS NOT CONTROLLED WITH YOUR NAUSEA MEDICATION  *UNUSUAL SHORTNESS OF BREATH  *UNUSUAL BRUISING OR BLEEDING  TENDERNESS IN MOUTH AND THROAT WITH OR WITHOUT PRESENCE OF ULCERS  *URINARY PROBLEMS  *BOWEL PROBLEMS  UNUSUAL RASH Items with * indicate a potential emergency and should be followed up as soon as possible.  Feel free to call the clinic should you have any questions or concerns. The clinic phone number is [phone].  Please show the [facility] at check-in to the Emergency Department and triage nurse.

## 2017-11-08 NOTE — Progress Notes (Signed)
Hematology and Oncology Follow Up Visit  [name] [id number] 1950-03-21 67 y.o. 11/08/2017   Principle Diagnosis:  IgG kappa myeloma Anemia secondary to renal insufficiency Intermittent iron - deficiency anemia Hypotestosteronemia  Current Therapy:   Aredia 60 mg IV q 2 months-- next dose 10/2017 Aranesp 300 mcg subcu as needed for hemoglobin less than 11 DepoTestosterone400 mg q 4 weeks IV iron as indicated Kyprolis/Cytoxan - s/p cycle#10   Interim History:  [name] is here today for follow-up and treatment.  He is doing okay.  He had a pretty decent weekend.  He has had no problems with nausea or vomiting.  He does have bad arthritis.  He does have diabetes which is somewhat labile.  This is actually day #8 of his 11th cycle of treatment.  His myeloma studies back on 10/18/2017 showed an M spike of 0.3 g/dL.  His IgG level was 900 mg/dL.  His kappa light chain was 2.7 mg/dL.  He does have somewhat low testosterone.  We have adjusted his testosterone dose.  We last checked his testosterone level couple weeks ago, the testosterone was 657.  He has had no rashes.  He has had no change in bowel or bladder habits.  He has had occasional leg swelling.  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  ECOG Performance Status: 1 - Symptomatic but completely ambulatory  Medications:  Allergies as of 11/08/2017      Reactions   Iodinated Diagnostic Agents Rash   Patient states he was instructed not to take IV contrast.  In 2008 he had an unknown reaction, and was told not to take it again.  He was also told not to take it due to his kidneys.   Iodine Anxiety, Rash, Other (See Comments)   Didn't feel right "instructed not to take per MD--something with his port" Didn't feel right Didn't feel right "instructed not to take per MD--something with his port"      Medication List        Accurate as of 11/08/17  8:20 AM. Always use your most recent med list.            amLODipine 10 MG tablet Commonly known as:  NORVASC Take 10 mg by mouth daily.   CARAFATE 1 GM/10ML suspension Generic drug:  sucralfate Take 1 g by mouth 4 (four) times daily.   cefdinir 300 MG capsule Commonly known as:  OMNICEF Take 1 capsule (300 mg total) by mouth 2 (two) times daily.   ciclopirox 8 % solution Commonly known as:  PENLAC APP QD. REMOVE WEEKLY WITH NAIL POLISH REMOVER THEN REAPPLY   CLEAR EYES ALL SEASONS 5-6 MG/ML Soln Generic drug:  Polyvinyl Alcohol-Povidone Place 2 drops into both eyes 3 (three) times daily as needed (drye eyes.).   dicyclomine 10 MG capsule Commonly known as:  BENTYL Take 1 capsule (10 mg total) by mouth 3 (three) times daily.   dronabinol 2.5 MG capsule Commonly known as:  MARINOL Take 1 capsule (2.5 mg total) by mouth 2 (two) times daily before a meal.   EMBEDA 30-1.2 MG Cpcr Generic drug:  Morphine-Naltrexone Take 3 (three) times daily by mouth.   furosemide 40 MG tablet Commonly known as:  LASIX Take 40 mg by mouth.   glipiZIDE 5 MG tablet Commonly known as:  GLUCOTROL Take 5 mg by mouth 2 (two) times daily before a meal.   isosorbide mononitrate 30 MG 24 hr tablet Commonly known as:  IMDUR Take 30 mg by mouth  daily.   latanoprost 0.005 % ophthalmic solution Commonly known as:  XALATAN Place 1 drop into the right eye every evening.   lidocaine-prilocaine cream Commonly known as:  EMLA Apply 1 application topically as needed.   linaclotide 145 MCG Caps capsule Commonly known as:  LINZESS Take 1 capsule (145 mcg total) by mouth daily before breakfast.   LORazepam 0.5 MG tablet Commonly known as:  ATIVAN Take 1 tablet (0.5 mg total) by mouth every 6 (six) hours as needed (Nausea or vomiting).   losartan 25 MG tablet Commonly known as:  COZAAR Take 25 mg by mouth.   meclizine 50 MG tablet Commonly known as:  ANTIVERT Take 0.5 tablets (25 mg total) by mouth 4 (four) times daily as needed for dizziness.    methocarbamol 500 MG tablet Commonly known as:  ROBAXIN Take 500 mg by mouth 4 (four) times daily.   mirabegron ER 50 MG Tb24 tablet Commonly known as:  MYRBETRIQ Take 50 mg by mouth daily.   omeprazole 40 MG capsule Commonly known as:  PRILOSEC take 1 capsule by mouth twice a day   ondansetron 8 MG tablet Commonly known as:  ZOFRAN TAKE 1 TABLET(8 MG) BY MOUTH TWICE DAILY AS NEEDED FOR NAUSEA OR VOMITING   orphenadrine 100 MG tablet Commonly known as:  NORFLEX Take 1 tablet (100 mg total) by mouth 2 (two) times daily.   oxyCODONE 20 mg 12 hr tablet Commonly known as:  OXYCONTIN Take 1 tablet (20 mg total) by mouth every 12 (twelve) hours.   oxyCODONE-acetaminophen 10-325 MG tablet Commonly known as:  PERCOCET Take 1 tablet by mouth every 6 (six) hours as needed for pain.   potassium chloride SA 20 MEQ tablet Commonly known as:  K-DUR,KLOR-CON Take 1 tablet (20 mEq total) by mouth 2 (two) times daily.   prochlorperazine 10 MG tablet Commonly known as:  COMPAZINE take 1 tablet by mouth every 6 hours if needed for nausea and vomiting   TOPROL XL 50 MG 24 hr tablet Generic drug:  metoprolol succinate Take 50 mg by mouth daily.   traMADol 50 MG tablet Commonly known as:  ULTRAM TAKE 1 TABLET(50 MG) BY MOUTH EVERY 6 HOURS AS NEEDED   Vitamin D (Ergocalciferol) 50000 units Caps capsule Commonly known as:  DRISDOL Take 50,000 Units by mouth every Saturday.   ZIOPTAN 0.0015 % Soln Generic drug:  Tafluprost Place 1 drop into both eyes daily.       Allergies:  Allergies  Allergen Reactions  . Iodinated Diagnostic Agents Rash    Patient states he was instructed not to take IV contrast.  In 2008 he had an unknown reaction, and was told not to take it again.  He was also told not to take it due to his kidneys.  . Iodine Anxiety, Rash and Other (See Comments)    Didn't feel right "instructed not to take per MD--something with his port" Didn't feel right Didn't feel  right "instructed not to take per MD--something with his port"    Past Medical History, Surgical history, Social history, and Family History were reviewed and updated.  Review of Systems: Review of Systems  Constitutional: Negative.   HENT: Negative.   Eyes: Negative.   Respiratory: Negative.   Cardiovascular: Negative.   Gastrointestinal: Negative.   Genitourinary: Negative.   Musculoskeletal: Positive for back pain, joint pain and myalgias.  Skin: Negative.   Neurological: Negative.   Endo/Heme/Allergies: Negative.   Psychiatric/Behavioral: Negative.      Physical  Exam:  weight is 197 lb (89.4 kg). His oral temperature is 98.5 F (36.9 C). His blood pressure is 154/72 (abnormal) and his pulse is 97. His respiration is 18 and oxygen saturation is 99%.   Wt Readings from Last 3 Encounters:  11/08/17 197 lb (89.4 kg)  11/01/17 198 lb 1.6 oz (89.9 kg)  10/18/17 197 lb (89.4 kg)    Physical Exam  Constitutional: He is oriented to person, place, and time.  HENT:  Head: Normocephalic and atraumatic.  Mouth/Throat: Oropharynx is clear and moist.  Eyes: Pupils are equal, round, and reactive to light. EOM are normal.  Neck: Normal range of motion.  Cardiovascular: Normal rate, regular rhythm and normal heart sounds.  Pulmonary/Chest: Effort normal and breath sounds normal.  Abdominal: Soft. Bowel sounds are normal.  Musculoskeletal: Normal range of motion. He exhibits no edema, tenderness or deformity.  Lymphadenopathy:    He has no cervical adenopathy.  Neurological: He is alert and oriented to person, place, and time.  Skin: Skin is warm and dry. No rash noted. No erythema.  Psychiatric: He has a normal mood and affect. His behavior is normal. Judgment and thought content normal.  Vitals reviewed.    Lab Results  Component Value Date   WBC 5.2 11/01/2017   HGB 10.3 (L) 11/01/2017   HCT 31.8 (L) 11/01/2017   MCV 81.3 (L) 11/01/2017   PLT 258 11/01/2017   Lab  Results  Component Value Date   FERRITIN 1,855 (H) 10/18/2017   IRON 57 10/18/2017   TIBC 230 10/18/2017   UIBC 173 10/18/2017   IRONPCTSAT 25 (L) 10/18/2017   Lab Results  Component Value Date   RETICCTPCT 1.1 10/04/2017   RBC 3.91 (L) 11/01/2017   RETICCTABS 27.5 03/21/2014   Lab Results  Component Value Date   KPAFRELGTCHN 27.4 (H) 10/18/2017   LAMBDASER 16.7 10/18/2017   KAPLAMBRATIO 1.64 10/18/2017   Lab Results  Component Value Date   IGGSERUM 901 10/18/2017   IGGSERUM 947 10/18/2017   IGA 157 10/18/2017   IGA 170 10/18/2017   IGMSERUM 28 10/18/2017   IGMSERUM 26 10/18/2017   Lab Results  Component Value Date   TOTALPROTELP 6.6 10/18/2017   ALBUMINELP 3.6 10/18/2017   A1GS 0.2 10/18/2017   A2GS 1.1 (H) 10/18/2017   BETS 1.0 10/18/2017   BETA2SER 0.5 11/07/2014   GAMS 0.7 10/18/2017   MSPIKE 0.3 (H) 10/18/2017   SPEI Comment 10/04/2017     Chemistry      Component Value Date/Time   NA 140 11/01/2017 0810   NA 143 02/22/2017 0744   K 3.7 11/01/2017 0810   K 4.0 02/22/2017 0744   CL 111 (H) 11/01/2017 0810   CL 106 02/22/2017 0744   CO2 28 11/01/2017 0810   CO2 27 02/22/2017 0744   BUN 14 11/01/2017 0810   BUN 26 (H) 02/22/2017 0744   CREATININE 2.10 (H) 11/01/2017 0810   CREATININE 2.3 (H) 02/22/2017 0744      Component Value Date/Time   CALCIUM 9.1 11/01/2017 0810   CALCIUM 9.3 02/22/2017 0744   ALKPHOS 73 11/01/2017 0810   ALKPHOS 80 02/22/2017 0744   AST 29 11/01/2017 0810   ALT 47 11/01/2017 0810   ALT 22 02/22/2017 0744   BILITOT 0.4 11/01/2017 0810      Impression and Plan: Ms. Tavano is a very pleasant 68 yo African American gentleman with IgG kappa myeloma.   So far, everything seems to be doing pretty well.  I  am happy that the myeloma levels have stabilized and are very low.  May be, if he continues to do well, we might be able to adjust his treatment schedule.  I will plan to see him back in another couple weeks.  He will start  his 12th cycle on October 7.  Volanda Napoleon, MD 9/16/20198:20 AM

## 2017-11-09 LAB — PROTEIN ELECTROPHORESIS, SERUM
A/G Ratio: 1.2 (ref 0.7–1.7)
ALBUMIN ELP: 3.6 g/dL (ref 2.9–4.4)
Alpha-1-Globulin: 0.2 g/dL (ref 0.0–0.4)
Alpha-2-Globulin: 1 g/dL (ref 0.4–1.0)
Beta Globulin: 0.9 g/dL (ref 0.7–1.3)
GAMMA GLOBULIN: 0.8 g/dL (ref 0.4–1.8)
Globulin, Total: 3 g/dL (ref 2.2–3.9)
TOTAL PROTEIN ELP: 6.6 g/dL (ref 6.0–8.5)

## 2017-11-09 LAB — KAPPA/LAMBDA LIGHT CHAINS
KAPPA FREE LGHT CHN: 32.6 mg/L — AB (ref 3.3–19.4)
Kappa, lambda light chain ratio: 1.6 (ref 0.26–1.65)
Lambda free light chains: 20.4 mg/L (ref 5.7–26.3)

## 2017-11-09 LAB — IGG, IGA, IGM
IGG (IMMUNOGLOBIN G), SERUM: 955 mg/dL (ref 700–1600)
IgA: 170 mg/dL (ref 61–437)
IgM (Immunoglobulin M), Srm: 22 mg/dL (ref 20–172)

## 2017-11-15 ENCOUNTER — Other Ambulatory Visit: Payer: Self-pay

## 2017-11-15 ENCOUNTER — Inpatient Hospital Stay: Payer: Medicare Other

## 2017-11-15 VITALS — BP 148/71 | HR 107 | Temp 98.3°F | Resp 18 | Wt 198.5 lb

## 2017-11-15 DIAGNOSIS — C9000 Multiple myeloma not having achieved remission: Secondary | ICD-10-CM

## 2017-11-15 DIAGNOSIS — N189 Chronic kidney disease, unspecified: Principal | ICD-10-CM

## 2017-11-15 DIAGNOSIS — M4712 Other spondylosis with myelopathy, cervical region: Secondary | ICD-10-CM

## 2017-11-15 DIAGNOSIS — D631 Anemia in chronic kidney disease: Secondary | ICD-10-CM

## 2017-11-15 DIAGNOSIS — Z299 Encounter for prophylactic measures, unspecified: Secondary | ICD-10-CM

## 2017-11-15 DIAGNOSIS — E349 Endocrine disorder, unspecified: Secondary | ICD-10-CM

## 2017-11-15 DIAGNOSIS — Z5112 Encounter for antineoplastic immunotherapy: Secondary | ICD-10-CM | POA: Diagnosis not present

## 2017-11-15 LAB — CMP (CANCER CENTER ONLY)
ALK PHOS: 77 U/L (ref 26–84)
ALT: 24 U/L (ref 10–47)
ANION GAP: 8 (ref 5–15)
AST: 16 U/L (ref 11–38)
Albumin: 3.3 g/dL — ABNORMAL LOW (ref 3.5–5.0)
BILIRUBIN TOTAL: 0.4 mg/dL (ref 0.2–1.6)
BUN: 20 mg/dL (ref 7–22)
CALCIUM: 9.4 mg/dL (ref 8.0–10.3)
CO2: 29 mmol/L (ref 18–33)
Chloride: 103 mmol/L (ref 98–108)
Creatinine: 2.5 mg/dL — ABNORMAL HIGH (ref 0.60–1.20)
Glucose, Bld: 286 mg/dL — ABNORMAL HIGH (ref 73–118)
Potassium: 3.9 mmol/L (ref 3.3–4.7)
Sodium: 140 mmol/L (ref 128–145)
TOTAL PROTEIN: 7 g/dL (ref 6.4–8.1)

## 2017-11-15 LAB — CBC WITH DIFFERENTIAL (CANCER CENTER ONLY)
BASOS ABS: 0 10*3/uL (ref 0.0–0.1)
Basophils Relative: 0 %
Eosinophils Absolute: 0.2 10*3/uL (ref 0.0–0.5)
Eosinophils Relative: 3 %
HEMATOCRIT: 32.8 % — AB (ref 38.7–49.9)
HEMOGLOBIN: 10.6 g/dL — AB (ref 13.0–17.1)
LYMPHS PCT: 10 %
Lymphs Abs: 0.7 10*3/uL — ABNORMAL LOW (ref 0.9–3.3)
MCH: 26.2 pg — ABNORMAL LOW (ref 28.0–33.4)
MCHC: 32.3 g/dL (ref 32.0–35.9)
MCV: 81.2 fL — AB (ref 82.0–98.0)
MONO ABS: 0.9 10*3/uL (ref 0.1–0.9)
MONOS PCT: 12 %
NEUTROS ABS: 5.7 10*3/uL (ref 1.5–6.5)
NEUTROS PCT: 75 %
Platelet Count: 189 10*3/uL (ref 145–400)
RBC: 4.04 MIL/uL — ABNORMAL LOW (ref 4.20–5.70)
RDW: 17.8 % — AB (ref 11.1–15.7)
WBC Count: 7.6 10*3/uL (ref 4.0–10.0)

## 2017-11-15 MED ORDER — DEXAMETHASONE SODIUM PHOSPHATE 10 MG/ML IJ SOLN
INTRAMUSCULAR | Status: AC
  Filled 2017-11-15: qty 1

## 2017-11-15 MED ORDER — SODIUM CHLORIDE 0.9% FLUSH
10.0000 mL | INTRAVENOUS | Status: DC | PRN
  Administered 2017-11-15: 10 mL
  Filled 2017-11-15: qty 10

## 2017-11-15 MED ORDER — HEPARIN SOD (PORK) LOCK FLUSH 100 UNIT/ML IV SOLN
500.0000 [IU] | Freq: Once | INTRAVENOUS | Status: AC | PRN
  Administered 2017-11-15: 500 [IU]
  Filled 2017-11-15: qty 5

## 2017-11-15 MED ORDER — SODIUM CHLORIDE 0.9 % IV SOLN
Freq: Once | INTRAVENOUS | Status: AC
  Administered 2017-11-15: 09:00:00 via INTRAVENOUS
  Filled 2017-11-15: qty 250

## 2017-11-15 MED ORDER — PALONOSETRON HCL INJECTION 0.25 MG/5ML
0.2500 mg | Freq: Once | INTRAVENOUS | Status: AC
  Administered 2017-11-15: 0.25 mg via INTRAVENOUS

## 2017-11-15 MED ORDER — SODIUM CHLORIDE 0.9 % IJ SOLN
10.0000 mL | Freq: Once | INTRAMUSCULAR | Status: AC
  Administered 2017-11-15: 10 mL
  Filled 2017-11-15: qty 10

## 2017-11-15 MED ORDER — DEXAMETHASONE SODIUM PHOSPHATE 10 MG/ML IJ SOLN
10.0000 mg | Freq: Once | INTRAMUSCULAR | Status: AC
  Administered 2017-11-15: 10 mg via INTRAVENOUS

## 2017-11-15 MED ORDER — HYDROMORPHONE HCL 2 MG/ML IJ SOLN
INTRAMUSCULAR | Status: AC
  Filled 2017-11-15: qty 1

## 2017-11-15 MED ORDER — DEXTROSE 5 % IV SOLN
29.0000 mg/m2 | Freq: Once | INTRAVENOUS | Status: AC
  Administered 2017-11-15: 60 mg via INTRAVENOUS
  Filled 2017-11-15: qty 30

## 2017-11-15 MED ORDER — HYDROMORPHONE HCL 2 MG/ML IJ SOLN
2.0000 mg | Freq: Once | INTRAMUSCULAR | Status: AC
  Administered 2017-11-15: 2 mg via INTRAVENOUS

## 2017-11-15 MED ORDER — PALONOSETRON HCL INJECTION 0.25 MG/5ML
INTRAVENOUS | Status: AC
  Filled 2017-11-15: qty 5

## 2017-11-15 MED ORDER — SODIUM CHLORIDE 0.9 % IV SOLN
300.0000 mg/m2 | Freq: Once | INTRAVENOUS | Status: AC
  Administered 2017-11-15: 620 mg via INTRAVENOUS
  Filled 2017-11-15: qty 31

## 2017-11-15 NOTE — Progress Notes (Signed)
Ok to treat with Creatinine 2.5 per Dr. [name].

## 2017-11-15 NOTE — Patient Instructions (Signed)
Implanted Port Home Guide An implanted port is a type of central line that is placed under the skin. Central lines are used to provide IV access when treatment or nutrition needs to be given through a person's veins. Implanted ports are used for long-term IV access. An implanted port may be placed because:  You need IV medicine that would be irritating to the small veins in your hands or arms.  You need long-term IV medicines, such as antibiotics.  You need IV nutrition for a long period.  You need frequent blood draws for lab tests.  You need dialysis.  Implanted ports are usually placed in the chest area, but they can also be placed in the upper arm, the abdomen, or the leg. An implanted port has two main parts:  Reservoir. The reservoir is round and will appear as a small, raised area under your skin. The reservoir is the part where a needle is inserted to give medicines or draw blood.  Catheter. The catheter is a thin, flexible tube that extends from the reservoir. The catheter is placed into a large vein. Medicine that is inserted into the reservoir goes into the catheter and then into the vein.  How will I care for my incision site? Do not get the incision site wet. Bathe or shower as directed by your health care provider. How is my port accessed? Special steps must be taken to access the port:  Before the port is accessed, a numbing cream can be placed on the skin. This helps numb the skin over the port site.  Your health care provider uses a sterile technique to access the port. ? Your health care provider must put on a mask and sterile gloves. ? The skin over your port is cleaned carefully with an antiseptic and allowed to dry. ? The port is gently pinched between sterile gloves, and a needle is inserted into the port.  Only "non-coring" port needles should be used to access the port. Once the port is accessed, a blood return should be checked. This helps ensure that the port  is in the vein and is not clogged.  If your port needs to remain accessed for a constant infusion, a clear (transparent) bandage will be placed over the needle site. The bandage and needle will need to be changed every week, or as directed by your health care provider.  Keep the bandage covering the needle clean and dry. Do not get it wet. Follow your health care provider's instructions on how to take a shower or bath while the port is accessed.  If your port does not need to stay accessed, no bandage is needed over the port.  What is flushing? Flushing helps keep the port from getting clogged. Follow your health care provider's instructions on how and when to flush the port. Ports are usually flushed with saline solution or a medicine called heparin. The need for flushing will depend on how the port is used.  If the port is used for intermittent medicines or blood draws, the port will need to be flushed: ? After medicines have been given. ? After blood has been drawn. ? As part of routine maintenance.  If a constant infusion is running, the port may not need to be flushed.  How long will my port stay implanted? The port can stay in for as long as your health care provider thinks it is needed. When it is time for the port to come out, surgery will be   done to remove it. The procedure is similar to the one performed when the port was put in. When should I seek immediate medical care? When you have an implanted port, you should seek immediate medical care if:  You notice a bad smell coming from the incision site.  You have swelling, redness, or drainage at the incision site.  You have more swelling or pain at the port site or the surrounding area.  You have a fever that is not controlled with medicine.  This information is not intended to replace advice given to you by your health care provider. Make sure you discuss any questions you have with your health care provider. Document  Released: 02/09/2005 Document Revised: 07/18/2015 Document Reviewed: 10/17/2012 Elsevier Interactive Patient Education  2017 [employer]  

## 2017-11-15 NOTE — Patient Instructions (Signed)
[facility] Discharge Instructions for Patients Receiving Chemotherapy  Today you received the following chemotherapy agents:  Kyprolis & Cytoxan  To help prevent nausea and vomiting after your treatment, we encourage you to take your nausea medication as prescribed.    If you develop nausea and vomiting that is not controlled by your nausea medication, call the clinic.   BELOW ARE SYMPTOMS THAT SHOULD BE REPORTED IMMEDIATELY:  *FEVER GREATER THAN 100.5 F  *CHILLS WITH OR WITHOUT FEVER  NAUSEA AND VOMITING THAT IS NOT CONTROLLED WITH YOUR NAUSEA MEDICATION  *UNUSUAL SHORTNESS OF BREATH  *UNUSUAL BRUISING OR BLEEDING  TENDERNESS IN MOUTH AND THROAT WITH OR WITHOUT PRESENCE OF ULCERS  *URINARY PROBLEMS  *BOWEL PROBLEMS  UNUSUAL RASH Items with * indicate a potential emergency and should be followed up as soon as possible.  Feel free to call the clinic should you have any questions or concerns. The clinic phone number is [phone].  Please show the CHEMO ALERT CARD at check-in to the Emergency Department and triage nurse.   

## 2017-11-29 ENCOUNTER — Other Ambulatory Visit: Payer: Self-pay

## 2017-11-29 ENCOUNTER — Inpatient Hospital Stay: Payer: Medicare Other

## 2017-11-29 ENCOUNTER — Inpatient Hospital Stay: Payer: Medicare Other | Attending: Hematology & Oncology

## 2017-11-29 ENCOUNTER — Inpatient Hospital Stay (HOSPITAL_BASED_OUTPATIENT_CLINIC_OR_DEPARTMENT_OTHER): Payer: Medicare Other | Admitting: Family

## 2017-11-29 VITALS — BP 144/71 | HR 94 | Temp 98.3°F | Resp 20 | Wt 200.5 lb

## 2017-11-29 DIAGNOSIS — D500 Iron deficiency anemia secondary to blood loss (chronic): Secondary | ICD-10-CM

## 2017-11-29 DIAGNOSIS — N189 Chronic kidney disease, unspecified: Principal | ICD-10-CM

## 2017-11-29 DIAGNOSIS — D508 Other iron deficiency anemias: Secondary | ICD-10-CM

## 2017-11-29 DIAGNOSIS — Z5112 Encounter for antineoplastic immunotherapy: Secondary | ICD-10-CM | POA: Diagnosis present

## 2017-11-29 DIAGNOSIS — K219 Gastro-esophageal reflux disease without esophagitis: Secondary | ICD-10-CM

## 2017-11-29 DIAGNOSIS — C9000 Multiple myeloma not having achieved remission: Secondary | ICD-10-CM

## 2017-11-29 DIAGNOSIS — D631 Anemia in chronic kidney disease: Secondary | ICD-10-CM | POA: Diagnosis not present

## 2017-11-29 DIAGNOSIS — D509 Iron deficiency anemia, unspecified: Secondary | ICD-10-CM | POA: Diagnosis not present

## 2017-11-29 DIAGNOSIS — Z299 Encounter for prophylactic measures, unspecified: Secondary | ICD-10-CM

## 2017-11-29 DIAGNOSIS — Z5111 Encounter for antineoplastic chemotherapy: Secondary | ICD-10-CM | POA: Insufficient documentation

## 2017-11-29 DIAGNOSIS — E349 Endocrine disorder, unspecified: Secondary | ICD-10-CM

## 2017-11-29 DIAGNOSIS — M4712 Other spondylosis with myelopathy, cervical region: Secondary | ICD-10-CM

## 2017-11-29 LAB — CBC WITH DIFFERENTIAL (CANCER CENTER ONLY)
BASOS ABS: 0.1 10*3/uL (ref 0.0–0.1)
Basophils Relative: 1 %
Eosinophils Absolute: 0.3 10*3/uL (ref 0.0–0.5)
Eosinophils Relative: 5 %
HEMATOCRIT: 33 % — AB (ref 38.7–49.9)
HEMOGLOBIN: 10.6 g/dL — AB (ref 13.0–17.1)
LYMPHS PCT: 12 %
Lymphs Abs: 0.7 10*3/uL — ABNORMAL LOW (ref 0.9–3.3)
MCH: 25.9 pg — ABNORMAL LOW (ref 28.0–33.4)
MCHC: 32.1 g/dL (ref 32.0–35.9)
MCV: 80.5 fL — AB (ref 82.0–98.0)
MONOS PCT: 11 %
Monocytes Absolute: 0.6 10*3/uL (ref 0.1–0.9)
NEUTROS ABS: 4.1 10*3/uL (ref 1.5–6.5)
NEUTROS PCT: 71 %
Platelet Count: 214 10*3/uL (ref 145–400)
RBC: 4.1 MIL/uL — ABNORMAL LOW (ref 4.20–5.70)
RDW: 17.1 % — ABNORMAL HIGH (ref 11.1–15.7)
WBC Count: 5.7 10*3/uL (ref 4.0–10.0)

## 2017-11-29 LAB — FERRITIN: FERRITIN: 1373 ng/mL — AB (ref 24–336)

## 2017-11-29 LAB — CMP (CANCER CENTER ONLY)
ALBUMIN: 3.5 g/dL (ref 3.5–5.0)
ALK PHOS: 73 U/L (ref 26–84)
ALT: 21 U/L (ref 10–47)
ANION GAP: 4 — AB (ref 5–15)
AST: 25 U/L (ref 11–38)
BILIRUBIN TOTAL: 0.5 mg/dL (ref 0.2–1.6)
BUN: 19 mg/dL (ref 7–22)
CO2: 30 mmol/L (ref 18–33)
CREATININE: 1.9 mg/dL — AB (ref 0.60–1.20)
Calcium: 8.9 mg/dL (ref 8.0–10.3)
Chloride: 106 mmol/L (ref 98–108)
Glucose, Bld: 256 mg/dL — ABNORMAL HIGH (ref 73–118)
Potassium: 3.8 mmol/L (ref 3.3–4.7)
Sodium: 140 mmol/L (ref 128–145)
Total Protein: 7 g/dL (ref 6.4–8.1)

## 2017-11-29 LAB — IRON AND TIBC
IRON: 47 ug/dL (ref 42–163)
Saturation Ratios: 21 % — ABNORMAL LOW (ref 42–163)
TIBC: 220 ug/dL (ref 202–409)
UIBC: 174 ug/dL

## 2017-11-29 MED ORDER — HYDROMORPHONE HCL 2 MG/ML IJ SOLN
INTRAMUSCULAR | Status: AC
  Filled 2017-11-29: qty 1

## 2017-11-29 MED ORDER — DEXAMETHASONE SODIUM PHOSPHATE 10 MG/ML IJ SOLN
INTRAMUSCULAR | Status: AC
  Filled 2017-11-29: qty 1

## 2017-11-29 MED ORDER — DEXTROSE 5 % IV SOLN
29.0000 mg/m2 | Freq: Once | INTRAVENOUS | Status: AC
  Administered 2017-11-29: 60 mg via INTRAVENOUS
  Filled 2017-11-29: qty 30

## 2017-11-29 MED ORDER — HYDROMORPHONE HCL 2 MG/ML IJ SOLN
2.0000 mg | Freq: Once | INTRAMUSCULAR | Status: AC
  Administered 2017-11-29: 2 mg via INTRAVENOUS

## 2017-11-29 MED ORDER — SODIUM CHLORIDE 0.9% FLUSH
10.0000 mL | INTRAVENOUS | Status: DC | PRN
  Administered 2017-11-29: 10 mL via INTRAVENOUS
  Filled 2017-11-29: qty 10

## 2017-11-29 MED ORDER — SODIUM CHLORIDE 0.9 % IV SOLN
300.0000 mg/m2 | Freq: Once | INTRAVENOUS | Status: AC
  Administered 2017-11-29: 620 mg via INTRAVENOUS
  Filled 2017-11-29: qty 31

## 2017-11-29 MED ORDER — PALONOSETRON HCL INJECTION 0.25 MG/5ML
0.2500 mg | Freq: Once | INTRAVENOUS | Status: AC
  Administered 2017-11-29: 0.25 mg via INTRAVENOUS

## 2017-11-29 MED ORDER — SODIUM CHLORIDE 0.9 % IV SOLN
INTRAVENOUS | Status: DC
  Administered 2017-11-29: 09:00:00 via INTRAVENOUS
  Filled 2017-11-29: qty 250

## 2017-11-29 MED ORDER — PALONOSETRON HCL INJECTION 0.25 MG/5ML
INTRAVENOUS | Status: AC
  Filled 2017-11-29: qty 5

## 2017-11-29 MED ORDER — SODIUM CHLORIDE 0.9 % IV SOLN
60.0000 mg | Freq: Once | INTRAVENOUS | Status: AC
  Administered 2017-11-29: 60 mg via INTRAVENOUS
  Filled 2017-11-29: qty 6.67

## 2017-11-29 MED ORDER — OMEPRAZOLE 40 MG PO CPDR: 40.0000 mg | DELAYED_RELEASE_CAPSULE | Freq: Two times a day (BID) | ORAL | 0 refills | Status: DC

## 2017-11-29 MED ORDER — HEPARIN SOD (PORK) LOCK FLUSH 100 UNIT/ML IV SOLN
500.0000 [IU] | Freq: Once | INTRAVENOUS | Status: AC
  Administered 2017-11-29: 500 [IU] via INTRAVENOUS
  Filled 2017-11-29: qty 5

## 2017-11-29 MED ORDER — DEXAMETHASONE SODIUM PHOSPHATE 10 MG/ML IJ SOLN
10.0000 mg | Freq: Once | INTRAMUSCULAR | Status: AC
  Administered 2017-11-29: 10 mg via INTRAVENOUS

## 2017-11-29 NOTE — Progress Notes (Signed)
Hematology and Oncology Follow Up Visit  [name] [id number] [birth year] 67 y.o. 11/29/2017   Principle Diagnosis:  IgG kappa myeloma Anemia secondary to renal insufficiency Intermittent iron - deficiency anemia Hypotestosteronemia  Current Therapy:   Aredia 60 mg IV q 2 months-- next dose 10/2017 Aranesp 300 mcg subcu as needed for hemoglobin less than 11 DepoTestosterone400 mg q 4 weeks IV iron as indicated Kyprolis/Cytoxan - s/p cycle11   Interim History:  [name] is here today for follow-up and treatment. He still has aches and pains in his neck and back that are bothersome. He feels that these are exacerbated by the changes in weather. He follows up with his orthopedist again on October 9th.  He still has some fatigue.  No M-spike present in September, IgG level 955 mg/dL and kappa light chains 3.26 mg/dL.  Hgb 10.6, WBC count 5.7 and platelet count 214.  He has had GERD lately and would like a refill on his Prilosec.  No fever, chills, n/v, cough, rash, dizziness, SOB, chest pain, palpitations, abdominal pain or changes in bowel or bladder habits.  The puffiness in his feet and ankles comes and goes. The neuropathy in his hands and feet is unchanged.  No lymphadenopathy noted on exam.  No episodes of bleeding, no bruising or petechiae.  He is eating well and staying hydrated. His weight is stable.   ECOG Performance Status: 1 - Symptomatic but completely ambulatory  Medications:  Allergies as of 11/29/2017      Reactions   Iodinated Diagnostic Agents Rash   Patient states he was instructed not to take IV contrast.  In 2008 he had an unknown reaction, and was told not to take it again.  He was also told not to take it due to his kidneys.   Iodine Anxiety, Rash, Other (See Comments)   Didn't feel right "instructed not to take per MD--something with his port" Didn't feel right Didn't feel right "instructed not to take per MD--something with his port"        Medication List        Accurate as of 11/29/17  9:01 AM. Always use your most recent med list.          amLODipine 10 MG tablet Commonly known as:  NORVASC Take 10 mg by mouth daily.   CARAFATE 1 GM/10ML suspension Generic drug:  sucralfate Take 1 g by mouth 4 (four) times daily.   cefdinir 300 MG capsule Commonly known as:  OMNICEF Take 1 capsule (300 mg total) by mouth 2 (two) times daily.   ciclopirox 8 % solution Commonly known as:  PENLAC APP QD. REMOVE WEEKLY WITH NAIL POLISH REMOVER THEN REAPPLY   CLEAR EYES ALL SEASONS 5-6 MG/ML Soln Generic drug:  Polyvinyl Alcohol-Povidone Place 2 drops into both eyes 3 (three) times daily as needed (drye eyes.).   dicyclomine 10 MG capsule Commonly known as:  BENTYL Take 1 capsule (10 mg total) by mouth 3 (three) times daily.   dronabinol 2.5 MG capsule Commonly known as:  MARINOL Take 1 capsule (2.5 mg total) by mouth 2 (two) times daily before a meal.   EMBEDA 30-1.2 MG Cpcr Generic drug:  Morphine-Naltrexone Take 3 (three) times daily by mouth.   EMBEDA 20-0.8 MG Cpcr Generic drug:  Morphine-Naltrexone TAKE 1 CAPSULE BY MOUTH THREE TIMES A DAY   furosemide 40 MG tablet Commonly known as:  LASIX Take 40 mg by mouth.   glipiZIDE 5 MG tablet Commonly known as:  GLUCOTROL  Take 5 mg by mouth 2 (two) times daily before a meal.   isosorbide mononitrate 30 MG 24 hr tablet Commonly known as:  IMDUR Take 30 mg by mouth daily.   latanoprost 0.005 % ophthalmic solution Commonly known as:  XALATAN Place 1 drop into the right eye every evening.   lidocaine-prilocaine cream Commonly known as:  EMLA Apply 1 application topically as needed.   linaclotide 145 MCG Caps capsule Commonly known as:  LINZESS Take 1 capsule (145 mcg total) by mouth daily before breakfast.   LORazepam 0.5 MG tablet Commonly known as:  ATIVAN Take 1 tablet (0.5 mg total) by mouth every 6 (six) hours as needed (Nausea or vomiting).    losartan 25 MG tablet Commonly known as:  COZAAR Take 25 mg by mouth.   meclizine 50 MG tablet Commonly known as:  ANTIVERT Take 0.5 tablets (25 mg total) by mouth 4 (four) times daily as needed for dizziness.   methocarbamol 500 MG tablet Commonly known as:  ROBAXIN Take 500 mg by mouth 4 (four) times daily.   mirabegron ER 50 MG Tb24 tablet Commonly known as:  MYRBETRIQ Take 50 mg by mouth daily.   omeprazole 40 MG capsule Commonly known as:  PRILOSEC take 1 capsule by mouth twice a day   ondansetron 8 MG tablet Commonly known as:  ZOFRAN TAKE 1 TABLET(8 MG) BY MOUTH TWICE DAILY AS NEEDED FOR NAUSEA OR VOMITING   orphenadrine 100 MG tablet Commonly known as:  NORFLEX Take 1 tablet (100 mg total) by mouth 2 (two) times daily.   oxyCODONE 20 mg 12 hr tablet Commonly known as:  OXYCONTIN Take 1 tablet (20 mg total) by mouth every 12 (twelve) hours.   oxyCODONE-acetaminophen 10-325 MG tablet Commonly known as:  PERCOCET Take 1 tablet by mouth every 6 (six) hours as needed for pain.   potassium chloride SA 20 MEQ tablet Commonly known as:  K-DUR,KLOR-CON Take 1 tablet (20 mEq total) by mouth 2 (two) times daily.   prochlorperazine 10 MG tablet Commonly known as:  COMPAZINE take 1 tablet by mouth every 6 hours if needed for nausea and vomiting   Sennosides 25 MG Tabs Take by mouth.   TOPROL XL 50 MG 24 hr tablet Generic drug:  metoprolol succinate Take 50 mg by mouth daily.   traMADol 50 MG tablet Commonly known as:  ULTRAM TAKE 1 TABLET(50 MG) BY MOUTH EVERY 6 HOURS AS NEEDED   Vitamin D (Ergocalciferol) 50000 units Caps capsule Commonly known as:  DRISDOL Take 50,000 Units by mouth every Saturday.   ZIOPTAN 0.0015 % Soln Generic drug:  Tafluprost Place 1 drop into both eyes daily.       Allergies:  Allergies  Allergen Reactions  . Iodinated Diagnostic Agents Rash    Patient states he was instructed not to take IV contrast.  In 2008 he had an  unknown reaction, and was told not to take it again.  He was also told not to take it due to his kidneys.  . Iodine Anxiety, Rash and Other (See Comments)    Didn't feel right "instructed not to take per MD--something with his port" Didn't feel right Didn't feel right "instructed not to take per MD--something with his port"    Past Medical History, Surgical history, Social history, and Family History were reviewed and updated.  Review of Systems: All other 10 point review of systems is negative.   Physical Exam:  weight is 200 lb 8 oz (90.9 kg).  His oral temperature is 98.3 F (36.8 C). His blood pressure is 144/71 (abnormal) and his pulse is 94. His respiration is 20 and oxygen saturation is 98%.   Wt Readings from Last 3 Encounters:  11/29/17 200 lb 8 oz (90.9 kg)  11/15/17 198 lb 8 oz (90 kg)  11/08/17 197 lb (89.4 kg)    Ocular: Sclerae unicteric, pupils equal, round and reactive to light Ear-nose-throat: Oropharynx clear, dentition fair Lymphatic: No cervical, supraclavicular or axillary adenopathy Lungs no rales or rhonchi, good excursion bilaterally Heart regular rate and rhythm, no murmur appreciated Abd soft, nontender, positive bowel sounds, no liver or spleen tip palpated on exam, no fluid wave  MSK no focal spinal tenderness, no joint edema Neuro: non-focal, well-oriented, appropriate affect Breasts: Deferred   Lab Results  Component Value Date   WBC 5.7 11/29/2017   HGB 10.6 (L) 11/29/2017   HCT 33.0 (L) 11/29/2017   MCV 80.5 (L) 11/29/2017   PLT 214 11/29/2017   Lab Results  Component Value Date   FERRITIN 1,855 (H) 10/18/2017   IRON 57 10/18/2017   TIBC 230 10/18/2017   UIBC 173 10/18/2017   IRONPCTSAT 25 (L) 10/18/2017   Lab Results  Component Value Date   RETICCTPCT 1.1 10/04/2017   RBC 4.10 (L) 11/29/2017   RETICCTABS 27.5 03/21/2014   Lab Results  Component Value Date   KPAFRELGTCHN 32.6 (H) 11/08/2017   LAMBDASER 20.4 11/08/2017    KAPLAMBRATIO 1.60 11/08/2017   Lab Results  Component Value Date   IGGSERUM 955 11/08/2017   IGA 170 11/08/2017   IGMSERUM 22 11/08/2017   Lab Results  Component Value Date   TOTALPROTELP 6.6 11/08/2017   ALBUMINELP 3.6 11/08/2017   A1GS 0.2 11/08/2017   A2GS 1.0 11/08/2017   BETS 0.9 11/08/2017   BETA2SER 0.5 11/07/2014   GAMS 0.8 11/08/2017   MSPIKE Not Observed 11/08/2017   SPEI Comment 11/08/2017     Chemistry      Component Value Date/Time   NA 140 11/29/2017 0809   NA 143 02/22/2017 0744   K 3.8 11/29/2017 0809   K 4.0 02/22/2017 0744   CL 106 11/29/2017 0809   CL 106 02/22/2017 0744   CO2 30 11/29/2017 0809   CO2 27 02/22/2017 0744   BUN 19 11/29/2017 0809   BUN 26 (H) 02/22/2017 0744   CREATININE 1.90 (H) 11/29/2017 0809   CREATININE 2.3 (H) 02/22/2017 0744      Component Value Date/Time   CALCIUM 8.9 11/29/2017 0809   CALCIUM 9.3 02/22/2017 0744   ALKPHOS 73 11/29/2017 0809   ALKPHOS 80 02/22/2017 0744   AST 25 11/29/2017 0809   ALT 21 11/29/2017 0809   ALT 22 02/22/2017 0744   BILITOT 0.5 11/29/2017 0809      Impression and Plan: [name] is a very pleasant 67 yo African American gentleman with IgG kappa myeloma. He continues to do well with treatment and has had a nice response.  We will proceed with treatment today as planned.  We will see him back in another 2 weeks.  He will contact our office with any questions or concerns. We can certainly see him sooner if need be.   Laverna Peace, NP 10/7/20199:01 AM

## 2017-11-29 NOTE — Patient Instructions (Signed)
[facility] Discharge Instructions for Patients Receiving Chemotherapy  Today you received the following chemotherapy agents:  Kyprolis and Cytoxan  To help prevent nausea and vomiting after your treatment, we encourage you to take your nausea medication as ordered per MD.    If you develop nausea and vomiting that is not controlled by your nausea medication, call the clinic.   BELOW ARE SYMPTOMS THAT SHOULD BE REPORTED IMMEDIATELY:  *FEVER GREATER THAN 100.5 F  *CHILLS WITH OR WITHOUT FEVER  NAUSEA AND VOMITING THAT IS NOT CONTROLLED WITH YOUR NAUSEA MEDICATION  *UNUSUAL SHORTNESS OF BREATH  *UNUSUAL BRUISING OR BLEEDING  TENDERNESS IN MOUTH AND THROAT WITH OR WITHOUT PRESENCE OF ULCERS  *URINARY PROBLEMS  *BOWEL PROBLEMS  UNUSUAL RASH Items with * indicate a potential emergency and should be followed up as soon as possible.  Feel free to call the clinic should you have any questions or concerns. The clinic phone number is [phone].  Please show the [place] at check-in to the Emergency Department and triage nurse.

## 2017-11-30 LAB — KAPPA/LAMBDA LIGHT CHAINS
KAPPA FREE LGHT CHN: 37 mg/L — AB (ref 3.3–19.4)
KAPPA, LAMDA LIGHT CHAIN RATIO: 1.52 (ref 0.26–1.65)
Lambda free light chains: 24.4 mg/L (ref 5.7–26.3)

## 2017-11-30 LAB — IGG, IGA, IGM
IGM (IMMUNOGLOBULIN M), SRM: 23 mg/dL (ref 20–172)
IgA: 161 mg/dL (ref 61–437)
IgG (Immunoglobin G), Serum: 915 mg/dL (ref 700–1600)

## 2017-11-30 LAB — TESTOSTERONE: Testosterone: 536 ng/dL (ref 264–916)

## 2017-12-03 LAB — PROTEIN ELECTROPHORESIS, SERUM, WITH REFLEX
A/G Ratio: 1.1 (ref 0.7–1.7)
ALBUMIN ELP: 3.3 g/dL (ref 2.9–4.4)
Alpha-1-Globulin: 0.2 g/dL (ref 0.0–0.4)
Alpha-2-Globulin: 1.1 g/dL — ABNORMAL HIGH (ref 0.4–1.0)
Beta Globulin: 0.9 g/dL (ref 0.7–1.3)
GAMMA GLOBULIN: 0.9 g/dL (ref 0.4–1.8)
Globulin, Total: 3.1 g/dL (ref 2.2–3.9)
M-Spike, %: 0.4 g/dL — ABNORMAL HIGH
SPEP INTERP: 0
TOTAL PROTEIN ELP: 6.4 g/dL (ref 6.0–8.5)

## 2017-12-03 LAB — IMMUNOFIXATION REFLEX, SERUM
IGA: 173 mg/dL (ref 61–437)
IGG (IMMUNOGLOBIN G), SERUM: 972 mg/dL (ref 700–1600)
IGM (IMMUNOGLOBULIN M), SRM: 20 mg/dL (ref 20–172)

## 2017-12-06 ENCOUNTER — Inpatient Hospital Stay: Payer: Medicare Other

## 2017-12-06 DIAGNOSIS — Z5112 Encounter for antineoplastic immunotherapy: Secondary | ICD-10-CM | POA: Diagnosis not present

## 2017-12-06 DIAGNOSIS — Z299 Encounter for prophylactic measures, unspecified: Secondary | ICD-10-CM

## 2017-12-06 DIAGNOSIS — N189 Chronic kidney disease, unspecified: Principal | ICD-10-CM

## 2017-12-06 DIAGNOSIS — C9000 Multiple myeloma not having achieved remission: Secondary | ICD-10-CM

## 2017-12-06 DIAGNOSIS — D631 Anemia in chronic kidney disease: Secondary | ICD-10-CM

## 2017-12-06 DIAGNOSIS — E349 Endocrine disorder, unspecified: Secondary | ICD-10-CM

## 2017-12-06 LAB — CBC WITH DIFFERENTIAL (CANCER CENTER ONLY)
Abs Immature Granulocytes: 0.03 10*3/uL (ref 0.00–0.07)
Basophils Absolute: 0 10*3/uL (ref 0.0–0.1)
Basophils Relative: 0 %
EOS PCT: 4 %
Eosinophils Absolute: 0.3 10*3/uL (ref 0.0–0.5)
HEMATOCRIT: 34.2 % — AB (ref 39.0–52.0)
Hemoglobin: 10.8 g/dL — ABNORMAL LOW (ref 13.0–17.0)
Immature Granulocytes: 1 %
LYMPHS ABS: 0.7 10*3/uL (ref 0.7–4.0)
LYMPHS PCT: 11 %
MCH: 25.1 pg — AB (ref 26.0–34.0)
MCHC: 31.6 g/dL (ref 30.0–36.0)
MCV: 79.5 fL — ABNORMAL LOW (ref 80.0–100.0)
MONO ABS: 0.8 10*3/uL (ref 0.1–1.0)
Monocytes Relative: 12 %
Neutro Abs: 4.5 10*3/uL (ref 1.7–7.7)
Neutrophils Relative %: 72 %
Platelet Count: 247 10*3/uL (ref 150–400)
RBC: 4.3 MIL/uL (ref 4.22–5.81)
RDW: 17.2 % — ABNORMAL HIGH (ref 11.5–15.5)
WBC: 6.3 10*3/uL (ref 4.0–10.5)
nRBC: 0 % (ref 0.0–0.2)

## 2017-12-06 LAB — CMP (CANCER CENTER ONLY)
ALK PHOS: 91 U/L — AB (ref 26–84)
ALT: 23 U/L (ref 10–47)
AST: 20 U/L (ref 11–38)
Albumin: 3.7 g/dL (ref 3.5–5.0)
Anion gap: 7 (ref 5–15)
BILIRUBIN TOTAL: 0.5 mg/dL (ref 0.2–1.6)
BUN: 35 mg/dL — ABNORMAL HIGH (ref 7–22)
CHLORIDE: 107 mmol/L (ref 98–108)
CO2: 26 mmol/L (ref 18–33)
CREATININE: 2.9 mg/dL — AB (ref 0.60–1.20)
Calcium: 9.2 mg/dL (ref 8.0–10.3)
Glucose, Bld: 187 mg/dL — ABNORMAL HIGH (ref 73–118)
POTASSIUM: 4 mmol/L (ref 3.3–4.7)
Sodium: 140 mmol/L (ref 128–145)
Total Protein: 7.3 g/dL (ref 6.4–8.1)

## 2017-12-06 MED ORDER — DEXTROSE 5 % IV SOLN
29.0000 mg/m2 | Freq: Once | INTRAVENOUS | Status: AC
  Administered 2017-12-06: 60 mg via INTRAVENOUS
  Filled 2017-12-06: qty 30

## 2017-12-06 MED ORDER — SODIUM CHLORIDE 0.9% FLUSH
10.0000 mL | INTRAVENOUS | Status: DC | PRN
  Administered 2017-12-06: 10 mL
  Filled 2017-12-06: qty 10

## 2017-12-06 MED ORDER — DEXAMETHASONE SODIUM PHOSPHATE 10 MG/ML IJ SOLN
10.0000 mg | Freq: Once | INTRAMUSCULAR | Status: AC
  Administered 2017-12-06: 10 mg via INTRAVENOUS

## 2017-12-06 MED ORDER — PALONOSETRON HCL INJECTION 0.25 MG/5ML
0.2500 mg | Freq: Once | INTRAVENOUS | Status: AC
  Administered 2017-12-06: 0.25 mg via INTRAVENOUS

## 2017-12-06 MED ORDER — DEXAMETHASONE SODIUM PHOSPHATE 10 MG/ML IJ SOLN
INTRAMUSCULAR | Status: AC
  Filled 2017-12-06: qty 1

## 2017-12-06 MED ORDER — HEPARIN SOD (PORK) LOCK FLUSH 100 UNIT/ML IV SOLN
500.0000 [IU] | Freq: Once | INTRAVENOUS | Status: AC | PRN
  Administered 2017-12-06: 500 [IU]
  Filled 2017-12-06: qty 5

## 2017-12-06 MED ORDER — HYDROMORPHONE HCL 4 MG/ML IJ SOLN
INTRAMUSCULAR | Status: AC
  Filled 2017-12-06: qty 1

## 2017-12-06 MED ORDER — SODIUM CHLORIDE 0.9 % IV SOLN
300.0000 mg/m2 | Freq: Once | INTRAVENOUS | Status: AC
  Administered 2017-12-06: 620 mg via INTRAVENOUS
  Filled 2017-12-06: qty 31

## 2017-12-06 MED ORDER — SODIUM CHLORIDE 0.9 % IV SOLN
Freq: Once | INTRAVENOUS | Status: DC
  Filled 2017-12-06: qty 250

## 2017-12-06 MED ORDER — PALONOSETRON HCL INJECTION 0.25 MG/5ML
INTRAVENOUS | Status: AC
  Filled 2017-12-06: qty 5

## 2017-12-06 MED ORDER — HYDROMORPHONE HCL 1 MG/ML IJ SOLN
2.0000 mg | Freq: Once | INTRAMUSCULAR | Status: AC
  Administered 2017-12-06: 2 mg via INTRAVENOUS

## 2017-12-06 MED ORDER — SODIUM CHLORIDE 0.9 % IV SOLN
Freq: Once | INTRAVENOUS | Status: AC
Start: 2017-12-06 — End: 2017-12-06
  Administered 2017-12-06: 10:00:00 via INTRAVENOUS
  Filled 2017-12-06: qty 250

## 2017-12-06 NOTE — Patient Instructions (Signed)
Cyclophosphamide injection What is this medicine? CYCLOPHOSPHAMIDE (sye kloe FOSS fa mide) is a chemotherapy drug. It slows the growth of cancer cells. This medicine is used to treat many types of cancer like lymphoma, myeloma, leukemia, breast cancer, and ovarian cancer, to name a few. This medicine may be used for other purposes; ask your health care provider or pharmacist if you have questions. COMMON BRAND NAME(S): Cytoxan, Neosar What should I tell my health care provider before I take this medicine? They need to know if you have any of these conditions: -blood disorders -history of other chemotherapy -infection -kidney disease -liver disease -recent or ongoing radiation therapy -tumors in the bone marrow -an unusual or allergic reaction to cyclophosphamide, other chemotherapy, other medicines, foods, dyes, or preservatives -pregnant or trying to get pregnant -breast-feeding How should I use this medicine? This drug is usually given as an injection into a vein or muscle or by infusion into a vein. It is administered in a hospital or clinic by a specially trained health care professional. Talk to your pediatrician regarding the use of this medicine in children. Special care may be needed. Overdosage: If you think you have taken too much of this medicine contact a poison control center or emergency room at once. NOTE: This medicine is only for you. Do not share this medicine with others. What if I miss a dose? It is important not to miss your dose. Call your doctor or health care professional if you are unable to keep an appointment. What may interact with this medicine? This medicine may interact with the following medications: -amiodarone -amphotericin B -azathioprine -certain antiviral medicines for HIV or AIDS such as protease inhibitors (e.g., indinavir, ritonavir) and zidovudine -certain blood pressure medications such as benazepril, captopril, enalapril, fosinopril,  lisinopril, moexipril, monopril, perindopril, quinapril, ramipril, trandolapril -certain cancer medications such as anthracyclines (e.g., daunorubicin, doxorubicin), busulfan, cytarabine, paclitaxel, pentostatin, tamoxifen, trastuzumab -certain diuretics such as chlorothiazide, chlorthalidone, hydrochlorothiazide, indapamide, metolazone -certain medicines that treat or prevent blood clots like warfarin -certain muscle relaxants such as succinylcholine -cyclosporine -etanercept -indomethacin -medicines to increase blood counts like filgrastim, pegfilgrastim, sargramostim -medicines used as general anesthesia -metronidazole -natalizumab This list may not describe all possible interactions. Give your health care provider a list of all the medicines, herbs, non-prescription drugs, or dietary supplements you use. Also tell them if you smoke, drink alcohol, or use illegal drugs. Some items may interact with your medicine. What should I watch for while using this medicine? Visit your doctor for checks on your progress. This drug may make you feel generally unwell. This is not uncommon, as chemotherapy can affect healthy cells as well as cancer cells. Report any side effects. Continue your course of treatment even though you feel ill unless your doctor tells you to stop. Drink water or other fluids as directed. Urinate often, even at night. In some cases, you may be given additional medicines to help with side effects. Follow all directions for their use. Call your doctor or health care professional for advice if you get a fever, chills or sore throat, or other symptoms of a cold or flu. Do not treat yourself. This drug decreases your body's ability to fight infections. Try to avoid being around people who are sick. This medicine may increase your risk to bruise or bleed. Call your doctor or health care professional if you notice any unusual bleeding. Be careful brushing and flossing your teeth or using a  toothpick because you may get an infection or bleed   more easily. If you have any dental work done, tell your dentist you are receiving this medicine. You may get drowsy or dizzy. Do not drive, use machinery, or do anything that needs mental alertness until you know how this medicine affects you. Do not become pregnant while taking this medicine or for 1 year after stopping it. Women should inform their doctor if they wish to become pregnant or think they might be pregnant. Men should not father a child while taking this medicine and for 4 months after stopping it. There is a potential for serious side effects to an unborn child. Talk to your health care professional or pharmacist for more information. Do not breast-feed an infant while taking this medicine. This medicine may interfere with the ability to have a child. This medicine has caused ovarian failure in some women. This medicine has caused reduced sperm counts in some men. You should talk with your doctor or health care professional if you are concerned about your fertility. If you are going to have surgery, tell your doctor or health care professional that you have taken this medicine. What side effects may I notice from receiving this medicine? Side effects that you should report to your doctor or health care professional as soon as possible: -allergic reactions like skin rash, itching or hives, swelling of the face, lips, or tongue -low blood counts - this medicine may decrease the number of white blood cells, red blood cells and platelets. You may be at increased risk for infections and bleeding. -signs of infection - fever or chills, cough, sore throat, pain or difficulty passing urine -signs of decreased platelets or bleeding - bruising, pinpoint red spots on the skin, black, tarry stools, blood in the urine -signs of decreased red blood cells - unusually weak or tired, fainting spells, lightheadedness -breathing problems -dark  urine -dizziness -palpitations -swelling of the ankles, feet, hands -trouble passing urine or change in the amount of urine -weight gain -yellowing of the eyes or skin Side effects that usually do not require medical attention (report to your doctor or health care professional if they continue or are bothersome): -changes in nail or skin color -hair loss -missed menstrual periods -mouth sores -nausea, vomiting This list may not describe all possible side effects. Call your doctor for medical advice about side effects. You may report side effects to FDA at 1-800-FDA-1088. Where should I keep my medicine? This drug is given in a hospital or clinic and will not be stored at home. NOTE: This sheet is a summary. It may not cover all possible information. If you have questions about this medicine, talk to your doctor, pharmacist, or health care provider.  2018 Elsevier/Gold Standard (2011-12-25 16:22:58) Carfilzomib injection What is this medicine? CARFILZOMIB (kar FILZ oh mib) targets a specific protein within cancer cells and stops the cancer cells from growing. It is used to treat multiple myeloma. This medicine may be used for other purposes; ask your health care provider or pharmacist if you have questions. COMMON BRAND NAME(S): KYPROLIS What should I tell my health care provider before I take this medicine? They need to know if you have any of these conditions: -heart disease -history of blood clots -irregular heartbeat -kidney disease -liver disease -lung or breathing disease -an unusual or allergic reaction to carfilzomib, or other medicines, foods, dyes, or preservatives -pregnant or trying to get pregnant -breast-feeding How should I use this medicine? This medicine is for injection or infusion into a vein. It is given  by a health care professional in a hospital or clinic setting. Talk to your pediatrician regarding the use of this medicine in children. Special care may be  needed. Overdosage: If you think you have taken too much of this medicine contact a poison control center or emergency room at once. NOTE: This medicine is only for you. Do not share this medicine with others. What if I miss a dose? It is important not to miss your dose. Call your doctor or health care professional if you are unable to keep an appointment. What may interact with this medicine? Interactions are not expected. Give your health care provider a list of all the medicines, herbs, non-prescription drugs, or dietary supplements you use. Also tell them if you smoke, drink alcohol, or use illegal drugs. Some items may interact with your medicine. This list may not describe all possible interactions. Give your health care provider a list of all the medicines, herbs, non-prescription drugs, or dietary supplements you use. Also tell them if you smoke, drink alcohol, or use illegal drugs. Some items may interact with your medicine. What should I watch for while using this medicine? Your condition will be monitored carefully while you are receiving this medicine. Report any side effects. Continue your course of treatment even though you feel ill unless your doctor tells you to stop. You may need blood work done while you are taking this medicine. Do not become pregnant while taking this medicine or for at least 30 days after stopping it. Women should inform their doctor if they wish to become pregnant or think they might be pregnant. There is a potential for serious side effects to an unborn child. Men should not father a child while taking this medicine and for 90 days after stopping it. Talk to your health care professional or pharmacist for more information. Do not breast-feed an infant while taking this medicine. Check with your doctor or health care professional if you get an attack of severe diarrhea, nausea and vomiting, or if you sweat a lot. The loss of too much body fluid can make it  dangerous for you to take this medicine. You may get dizzy. Do not drive, use machinery, or do anything that needs mental alertness until you know how this medicine affects you. Do not stand or sit up quickly, especially if you are an older patient. This reduces the risk of dizzy or fainting spells. What side effects may I notice from receiving this medicine? Side effects that you should report to your doctor or health care professional as soon as possible: -allergic reactions like skin rash, itching or hives, swelling of the face, lips, or tongue -confusion -dizziness -feeling faint or lightheaded -fever or chills -palpitations -seizures -signs and symptoms of bleeding such as bloody or black, tarry stools; red or dark-brown urine; spitting up blood or brown material that looks like coffee grounds; red spots on the skin; unusual bruising or bleeding including from the eye, gums, or nose -signs and symptoms of a blood clot such as breathing problems; changes in vision; chest pain; severe, sudden headache; pain, swelling, warmth in the leg; trouble speaking; sudden numbness or weakness of the face, arm or leg -signs and symptoms of kidney injury like trouble passing urine or change in the amount of urine -signs and symptoms of liver injury like dark yellow or brown urine; general ill feeling or flu-like symptoms; light-colored stools; loss of appetite; nausea; right upper belly pain; unusually weak or tired; yellowing of the   eyes or skin Side effects that usually do not require medical attention (report to your doctor or health care professional if they continue or are bothersome): -back pain -cough -diarrhea -headache -muscle cramps -vomiting This list may not describe all possible side effects. Call your doctor for medical advice about side effects. You may report side effects to FDA at 1-800-FDA-1088. Where should I keep my medicine? This drug is given in a hospital or clinic and will not  be stored at home. NOTE: This sheet is a summary. It may not cover all possible information. If you have questions about this medicine, talk to your doctor, pharmacist, or health care provider.  2018 Elsevier/Gold Standard (2015-03-14 13:39:23)  

## 2017-12-06 NOTE — Patient Instructions (Signed)

## 2017-12-13 ENCOUNTER — Inpatient Hospital Stay (HOSPITAL_BASED_OUTPATIENT_CLINIC_OR_DEPARTMENT_OTHER): Payer: Medicare Other | Admitting: Family

## 2017-12-13 ENCOUNTER — Inpatient Hospital Stay: Payer: Medicare Other

## 2017-12-13 ENCOUNTER — Other Ambulatory Visit: Payer: Self-pay

## 2017-12-13 ENCOUNTER — Encounter: Payer: Self-pay | Admitting: Family

## 2017-12-13 VITALS — BP 164/74 | HR 105 | Temp 97.8°F | Resp 18 | Wt 198.0 lb

## 2017-12-13 DIAGNOSIS — E349 Endocrine disorder, unspecified: Secondary | ICD-10-CM

## 2017-12-13 DIAGNOSIS — D508 Other iron deficiency anemias: Secondary | ICD-10-CM

## 2017-12-13 DIAGNOSIS — C9000 Multiple myeloma not having achieved remission: Secondary | ICD-10-CM

## 2017-12-13 DIAGNOSIS — N189 Chronic kidney disease, unspecified: Secondary | ICD-10-CM | POA: Diagnosis not present

## 2017-12-13 DIAGNOSIS — D631 Anemia in chronic kidney disease: Secondary | ICD-10-CM | POA: Diagnosis not present

## 2017-12-13 DIAGNOSIS — Z5112 Encounter for antineoplastic immunotherapy: Secondary | ICD-10-CM | POA: Diagnosis not present

## 2017-12-13 DIAGNOSIS — Z299 Encounter for prophylactic measures, unspecified: Secondary | ICD-10-CM

## 2017-12-13 LAB — CMP (CANCER CENTER ONLY)
ALT: 25 U/L (ref 10–47)
ANION GAP: 0 — AB (ref 5–15)
AST: 23 U/L (ref 11–38)
Albumin: 3.7 g/dL (ref 3.5–5.0)
Alkaline Phosphatase: 72 U/L (ref 26–84)
BUN: 25 mg/dL — ABNORMAL HIGH (ref 7–22)
CO2: 28 mmol/L (ref 18–33)
Calcium: 9.5 mg/dL (ref 8.0–10.3)
Chloride: 109 mmol/L — ABNORMAL HIGH (ref 98–108)
Creatinine: 2.2 mg/dL — ABNORMAL HIGH (ref 0.60–1.20)
GLUCOSE: 184 mg/dL — AB (ref 73–118)
POTASSIUM: 4.2 mmol/L (ref 3.3–4.7)
Sodium: 136 mmol/L (ref 128–145)
Total Bilirubin: 0.6 mg/dL (ref 0.2–1.6)
Total Protein: 7.1 g/dL (ref 6.4–8.1)

## 2017-12-13 LAB — CBC WITH DIFFERENTIAL (CANCER CENTER ONLY)
Abs Immature Granulocytes: 0.05 10*3/uL (ref 0.00–0.07)
BASOS PCT: 1 %
Basophils Absolute: 0 10*3/uL (ref 0.0–0.1)
EOS ABS: 0.3 10*3/uL (ref 0.0–0.5)
Eosinophils Relative: 3 %
HCT: 34.1 % — ABNORMAL LOW (ref 39.0–52.0)
Hemoglobin: 10.7 g/dL — ABNORMAL LOW (ref 13.0–17.0)
IMMATURE GRANULOCYTES: 1 %
Lymphocytes Relative: 8 %
Lymphs Abs: 0.7 10*3/uL (ref 0.7–4.0)
MCH: 24.9 pg — ABNORMAL LOW (ref 26.0–34.0)
MCHC: 31.4 g/dL (ref 30.0–36.0)
MCV: 79.5 fL — AB (ref 80.0–100.0)
MONOS PCT: 8 %
Monocytes Absolute: 0.6 10*3/uL (ref 0.1–1.0)
NEUTROS PCT: 79 %
Neutro Abs: 6.3 10*3/uL (ref 1.7–7.7)
PLATELETS: 179 10*3/uL (ref 150–400)
RBC: 4.29 MIL/uL (ref 4.22–5.81)
RDW: 17.2 % — AB (ref 11.5–15.5)
WBC: 7.9 10*3/uL (ref 4.0–10.5)
nRBC: 0 % (ref 0.0–0.2)

## 2017-12-13 LAB — IRON AND TIBC
Iron: 110 ug/dL (ref 42–163)
SATURATION RATIOS: 45 % (ref 42–163)
TIBC: 244 ug/dL (ref 202–409)
UIBC: 134 ug/dL

## 2017-12-13 LAB — FERRITIN: Ferritin: 1458 ng/mL — ABNORMAL HIGH (ref 24–336)

## 2017-12-13 MED ORDER — HYDROMORPHONE HCL 1 MG/ML IJ SOLN
INTRAMUSCULAR | Status: AC
  Filled 2017-12-13: qty 2

## 2017-12-13 MED ORDER — SODIUM CHLORIDE 0.9 % IV SOLN
300.0000 mg/m2 | Freq: Once | INTRAVENOUS | Status: AC
  Administered 2017-12-13: 620 mg via INTRAVENOUS
  Filled 2017-12-13: qty 31

## 2017-12-13 MED ORDER — PALONOSETRON HCL INJECTION 0.25 MG/5ML
INTRAVENOUS | Status: AC
  Filled 2017-12-13: qty 5

## 2017-12-13 MED ORDER — HYDROMORPHONE HCL 1 MG/ML IJ SOLN
2.0000 mg | Freq: Once | INTRAMUSCULAR | Status: AC
  Administered 2017-12-13: 2 mg via INTRAVENOUS

## 2017-12-13 MED ORDER — DEXAMETHASONE SODIUM PHOSPHATE 10 MG/ML IJ SOLN
10.0000 mg | Freq: Once | INTRAMUSCULAR | Status: AC
  Administered 2017-12-13: 10 mg via INTRAVENOUS

## 2017-12-13 MED ORDER — DEXTROSE 5 % IV SOLN
60.0000 mg | Freq: Once | INTRAVENOUS | Status: AC
  Administered 2017-12-13: 60 mg via INTRAVENOUS
  Filled 2017-12-13: qty 30

## 2017-12-13 MED ORDER — PALONOSETRON HCL INJECTION 0.25 MG/5ML
0.2500 mg | Freq: Once | INTRAVENOUS | Status: AC
  Administered 2017-12-13: 0.25 mg via INTRAVENOUS

## 2017-12-13 MED ORDER — TESTOSTERONE CYPIONATE 200 MG/ML IM SOLN
INTRAMUSCULAR | Status: AC
  Filled 2017-12-13: qty 2

## 2017-12-13 MED ORDER — HEPARIN SOD (PORK) LOCK FLUSH 100 UNIT/ML IV SOLN
500.0000 [IU] | Freq: Once | INTRAVENOUS | Status: AC | PRN
  Administered 2017-12-13: 500 [IU]
  Filled 2017-12-13: qty 5

## 2017-12-13 MED ORDER — SODIUM CHLORIDE 0.9% FLUSH
10.0000 mL | INTRAVENOUS | Status: DC | PRN
  Administered 2017-12-13: 10 mL
  Filled 2017-12-13: qty 10

## 2017-12-13 MED ORDER — SODIUM CHLORIDE 0.9 % IV SOLN
Freq: Once | INTRAVENOUS | Status: AC
  Administered 2017-12-13: 09:00:00 via INTRAVENOUS
  Filled 2017-12-13: qty 250

## 2017-12-13 MED ORDER — TESTOSTERONE CYPIONATE 200 MG/ML IM SOLN
400.0000 mg | Freq: Once | INTRAMUSCULAR | Status: AC
  Administered 2017-12-13: 400 mg via INTRAMUSCULAR

## 2017-12-13 MED ORDER — DEXAMETHASONE SODIUM PHOSPHATE 10 MG/ML IJ SOLN
INTRAMUSCULAR | Status: AC
  Filled 2017-12-13: qty 1

## 2017-12-13 NOTE — Patient Instructions (Signed)
[facility] Discharge Instructions for Patients Receiving Chemotherapy  Today you received the following chemotherapy agents Kyprolis, Cytoxan To help prevent nausea and vomiting after your treatment, we encourage you to take your nausea medication    If you develop nausea and vomiting that is not controlled by your nausea medication, call the clinic.   BELOW ARE SYMPTOMS THAT SHOULD BE REPORTED IMMEDIATELY:  *FEVER GREATER THAN 100.5 F  *CHILLS WITH OR WITHOUT FEVER  NAUSEA AND VOMITING THAT IS NOT CONTROLLED WITH YOUR NAUSEA MEDICATION  *UNUSUAL SHORTNESS OF BREATH  *UNUSUAL BRUISING OR BLEEDING  TENDERNESS IN MOUTH AND THROAT WITH OR WITHOUT PRESENCE OF ULCERS  *URINARY PROBLEMS  *BOWEL PROBLEMS  UNUSUAL RASH Items with * indicate a potential emergency and should be followed up as soon as possible.  Feel free to call the clinic should you have any questions or concerns. The clinic phone number is [phone].  Please show the [place] at check-in to the Emergency Department and triage nurse.

## 2017-12-13 NOTE — Progress Notes (Signed)
Hematology and Oncology Follow Up Visit  [name] [id number] 06-11-1950 67 y.o. 12/13/2017   Principle Diagnosis:  IgG kappa myeloma Anemia secondary to renal insufficiency Intermittent iron - deficiency anemia Hypotestosteronemia  Current Therapy:   Aredia 60 mg IV q 2 months-- next dose 10/2017 Aranesp 300 mcg subcu as needed for hemoglobin less than 11 DepoTestosterone400 mg q 4 weeks IV iron as indicated Kyprolis/Cytoxan - s/p cycle11   Interim History:  [name] is here today for follow-up and treatment. He is doing fairly well but still has some back and leg pain. He was able to see his orthopedist last week and received a steroid injection in his back. He feels that this has helped with his pain.  M-spike earlier this month was 0.4, IgG level 915 mg/dL and 3.70 mg/dL No episodes of bleeding, no bruising or petechiae.  Testosterone was 536.  He has had no issue with infection. No fever, chills, n/v, cough, rash, SOB, chest pain, palpitations or changes in bowel or bladder habits.  He plans to follow-up with both urology for urinary retention and GI for possible endoscopy and esophageal dilation.  He has not had any episodes of choking but does have problems swallowing at time.  He still has constipation despite taking linzess.  No swelling or tenderness in his extremities at this time. The neuropathy in his hands and feet is unchanged.  No falls or syncopal episodes to report.  No lymphadenopathy noted on exam.  His appetite comes and goes. He has been supplementing with Ensure 2-3 times a day. His weight is stable. He states that he is staying well hydrated.   ECOG Performance Status: 1 - Symptomatic but completely ambulatory  Medications:  Allergies as of 12/13/2017      Reactions   Iodinated Diagnostic Agents Rash   Patient states he was instructed not to take IV contrast.  In 2008 he had an unknown reaction, and was told not to take it again.  He was also  told not to take it due to his kidneys.   Iodine Anxiety, Rash, Other (See Comments)   Didn't feel right "instructed not to take per MD--something with his port" Didn't feel right Didn't feel right "instructed not to take per MD--something with his port"      Medication List        Accurate as of 12/13/17  8:21 AM. Always use your most recent med list.          amLODipine 10 MG tablet Commonly known as:  NORVASC Take 10 mg by mouth daily.   CARAFATE 1 GM/10ML suspension Generic drug:  sucralfate Take 1 g by mouth 4 (four) times daily.   cefdinir 300 MG capsule Commonly known as:  OMNICEF Take 1 capsule (300 mg total) by mouth 2 (two) times daily.   ciclopirox 8 % solution Commonly known as:  PENLAC APP QD. REMOVE WEEKLY WITH NAIL POLISH REMOVER THEN REAPPLY   CLEAR EYES ALL SEASONS 5-6 MG/ML Soln Generic drug:  Polyvinyl Alcohol-Povidone Place 2 drops into both eyes 3 (three) times daily as needed (drye eyes.).   dicyclomine 10 MG capsule Commonly known as:  BENTYL Take 1 capsule (10 mg total) by mouth 3 (three) times daily.   dronabinol 2.5 MG capsule Commonly known as:  MARINOL Take 1 capsule (2.5 mg total) by mouth 2 (two) times daily before a meal.   EMBEDA 30-1.2 MG Cpcr Generic drug:  Morphine-Naltrexone Take 3 (three) times daily by mouth.  EMBEDA 20-0.8 MG Cpcr Generic drug:  Morphine-Naltrexone TAKE 1 CAPSULE BY MOUTH THREE TIMES A DAY   furosemide 40 MG tablet Commonly known as:  LASIX Take 40 mg by mouth.   glipiZIDE 5 MG tablet Commonly known as:  GLUCOTROL Take 5 mg by mouth 2 (two) times daily before a meal.   isosorbide mononitrate 30 MG 24 hr tablet Commonly known as:  IMDUR Take 30 mg by mouth daily.   latanoprost 0.005 % ophthalmic solution Commonly known as:  XALATAN Place 1 drop into the right eye every evening.   lidocaine-prilocaine cream Commonly known as:  EMLA Apply 1 application topically as needed.   linaclotide 145  MCG Caps capsule Commonly known as:  LINZESS Take 1 capsule (145 mcg total) by mouth daily before breakfast.   LORazepam 0.5 MG tablet Commonly known as:  ATIVAN Take 1 tablet (0.5 mg total) by mouth every 6 (six) hours as needed (Nausea or vomiting).   losartan 25 MG tablet Commonly known as:  COZAAR Take 25 mg by mouth.   meclizine 50 MG tablet Commonly known as:  ANTIVERT Take 0.5 tablets (25 mg total) by mouth 4 (four) times daily as needed for dizziness.   methocarbamol 500 MG tablet Commonly known as:  ROBAXIN Take 500 mg by mouth 4 (four) times daily.   mirabegron ER 50 MG Tb24 tablet Commonly known as:  MYRBETRIQ Take 50 mg by mouth daily.   omeprazole 40 MG capsule Commonly known as:  PRILOSEC Take 1 capsule (40 mg total) by mouth 2 (two) times daily.   ondansetron 8 MG tablet Commonly known as:  ZOFRAN TAKE 1 TABLET(8 MG) BY MOUTH TWICE DAILY AS NEEDED FOR NAUSEA OR VOMITING   orphenadrine 100 MG tablet Commonly known as:  NORFLEX Take 1 tablet (100 mg total) by mouth 2 (two) times daily.   oxyCODONE 20 mg 12 hr tablet Commonly known as:  OXYCONTIN Take 1 tablet (20 mg total) by mouth every 12 (twelve) hours.   oxyCODONE-acetaminophen 10-325 MG tablet Commonly known as:  PERCOCET Take 1 tablet by mouth every 6 (six) hours as needed for pain.   potassium chloride SA 20 MEQ tablet Commonly known as:  K-DUR,KLOR-CON Take 1 tablet (20 mEq total) by mouth 2 (two) times daily.   prochlorperazine 10 MG tablet Commonly known as:  COMPAZINE take 1 tablet by mouth every 6 hours if needed for nausea and vomiting   Sennosides 25 MG Tabs Take by mouth.   TOPROL XL 50 MG 24 hr tablet Generic drug:  metoprolol succinate Take 50 mg by mouth daily.   traMADol 50 MG tablet Commonly known as:  ULTRAM TAKE 1 TABLET(50 MG) BY MOUTH EVERY 6 HOURS AS NEEDED   Vitamin D (Ergocalciferol) 50000 units Caps capsule Commonly known as:  DRISDOL Take 50,000 Units by mouth  every Saturday.   ZIOPTAN 0.0015 % Soln Generic drug:  Tafluprost Place 1 drop into both eyes daily.       Allergies:  Allergies  Allergen Reactions  . Iodinated Diagnostic Agents Rash    Patient states he was instructed not to take IV contrast.  In 2008 he had an unknown reaction, and was told not to take it again.  He was also told not to take it due to his kidneys.  . Iodine Anxiety, Rash and Other (See Comments)    Didn't feel right "instructed not to take per MD--something with his port" Didn't feel right Didn't feel right "instructed not to take  per MD--something with his port"    Past Medical History, Surgical history, Social history, and Family History were reviewed and updated.  Review of Systems: All other 10 point review of systems is negative.   Physical Exam:  vitals were not taken for this visit.   Wt Readings from Last 3 Encounters:  11/29/17 200 lb 8 oz (90.9 kg)  11/15/17 198 lb 8 oz (90 kg)  11/08/17 197 lb (89.4 kg)    Ocular: Sclerae unicteric, pupils equal, round and reactive to light Ear-nose-throat: Oropharynx clear, dentition fair Lymphatic: No cervical, supraclavicular or axillary adenopathy Lungs no rales or rhonchi, good excursion bilaterally Heart regular rate and rhythm, no murmur appreciated Abd soft, nontender, positive bowel sounds, no liver or spleen tip palpated on exam, no fluid wave  MSK generalized joint aches and back pain Neuro: non-focal, well-oriented, appropriate affect Breasts: Deferred   Lab Results  Component Value Date   WBC 6.3 12/06/2017   HGB 10.8 (L) 12/06/2017   HCT 34.2 (L) 12/06/2017   MCV 79.5 (L) 12/06/2017   PLT 247 12/06/2017   Lab Results  Component Value Date   FERRITIN 1,373 (H) 11/29/2017   IRON 47 11/29/2017   TIBC 220 11/29/2017   UIBC 174 11/29/2017   IRONPCTSAT 21 (L) 11/29/2017   Lab Results  Component Value Date   RETICCTPCT 1.1 10/04/2017   RBC 4.30 12/06/2017   RETICCTABS 27.5  03/21/2014   Lab Results  Component Value Date   KPAFRELGTCHN 37.0 (H) 11/29/2017   LAMBDASER 24.4 11/29/2017   KAPLAMBRATIO 1.52 11/29/2017   Lab Results  Component Value Date   IGGSERUM 972 11/29/2017   IGA 173 11/29/2017   IGMSERUM 20 11/29/2017   Lab Results  Component Value Date   TOTALPROTELP 6.4 11/29/2017   ALBUMINELP 3.3 11/29/2017   A1GS 0.2 11/29/2017   A2GS 1.1 (H) 11/29/2017   BETS 0.9 11/29/2017   BETA2SER 0.5 11/07/2014   GAMS 0.9 11/29/2017   MSPIKE 0.4 (H) 11/29/2017   SPEI Comment 11/08/2017     Chemistry      Component Value Date/Time   NA 140 12/06/2017 0815   NA 143 02/22/2017 0744   K 4.0 12/06/2017 0815   K 4.0 02/22/2017 0744   CL 107 12/06/2017 0815   CL 106 02/22/2017 0744   CO2 26 12/06/2017 0815   CO2 27 02/22/2017 0744   BUN 35 (H) 12/06/2017 0815   BUN 26 (H) 02/22/2017 0744   CREATININE 2.90 (H) 12/06/2017 0815   CREATININE 2.3 (H) 02/22/2017 0744      Component Value Date/Time   CALCIUM 9.2 12/06/2017 0815   CALCIUM 9.3 02/22/2017 0744   ALKPHOS 91 (H) 12/06/2017 0815   ALKPHOS 80 02/22/2017 0744   AST 20 12/06/2017 0815   ALT 23 12/06/2017 0815   ALT 22 02/22/2017 0744   BILITOT 0.5 12/06/2017 0815       Impression and Plan: Mr. Washko is a very pleasant 67 yo African American gentleman with IgG kappa myeloma. He continues to tolerate treatment nicely.  We will proceed with infusion today as planned.  He will also received Testosterone.  We will see what his iron studies show and replace if needed.  He states that he will follow-up with urology regarding his urinary retention and GI for endoscopy and esophageal dilation if needed.  We will plan to see him back in another 2 weeks.  He will contact our office with any questions or concerns. We can certainly see  him sooner if need be.   Laverna Peace, NP 10/21/20198:21 AM

## 2017-12-14 LAB — TESTOSTERONE: Testosterone: 208 ng/dL — ABNORMAL LOW (ref 264–916)

## 2017-12-22 ENCOUNTER — Other Ambulatory Visit: Payer: Self-pay | Admitting: Family

## 2017-12-22 DIAGNOSIS — R109 Unspecified abdominal pain: Secondary | ICD-10-CM

## 2017-12-27 ENCOUNTER — Inpatient Hospital Stay: Payer: Medicare Other

## 2017-12-27 ENCOUNTER — Inpatient Hospital Stay: Payer: Medicare Other | Attending: Hematology & Oncology

## 2017-12-27 VITALS — BP 146/70 | HR 96 | Temp 98.7°F | Resp 20

## 2017-12-27 DIAGNOSIS — D631 Anemia in chronic kidney disease: Secondary | ICD-10-CM

## 2017-12-27 DIAGNOSIS — C9000 Multiple myeloma not having achieved remission: Secondary | ICD-10-CM | POA: Diagnosis present

## 2017-12-27 DIAGNOSIS — Z5112 Encounter for antineoplastic immunotherapy: Secondary | ICD-10-CM | POA: Insufficient documentation

## 2017-12-27 DIAGNOSIS — E611 Iron deficiency: Secondary | ICD-10-CM | POA: Insufficient documentation

## 2017-12-27 DIAGNOSIS — E349 Endocrine disorder, unspecified: Secondary | ICD-10-CM

## 2017-12-27 DIAGNOSIS — N189 Chronic kidney disease, unspecified: Secondary | ICD-10-CM | POA: Diagnosis present

## 2017-12-27 DIAGNOSIS — Z5111 Encounter for antineoplastic chemotherapy: Secondary | ICD-10-CM | POA: Diagnosis not present

## 2017-12-27 DIAGNOSIS — Z299 Encounter for prophylactic measures, unspecified: Secondary | ICD-10-CM

## 2017-12-27 DIAGNOSIS — D508 Other iron deficiency anemias: Secondary | ICD-10-CM

## 2017-12-27 DIAGNOSIS — E1121 Type 2 diabetes mellitus with diabetic nephropathy: Secondary | ICD-10-CM | POA: Diagnosis not present

## 2017-12-27 DIAGNOSIS — M4712 Other spondylosis with myelopathy, cervical region: Secondary | ICD-10-CM

## 2017-12-27 LAB — CBC WITH DIFFERENTIAL (CANCER CENTER ONLY)
Abs Immature Granulocytes: 0.01 10*3/uL (ref 0.00–0.07)
Basophils Absolute: 0 10*3/uL (ref 0.0–0.1)
Basophils Relative: 1 %
EOS PCT: 3 %
Eosinophils Absolute: 0.2 10*3/uL (ref 0.0–0.5)
HCT: 30.9 % — ABNORMAL LOW (ref 39.0–52.0)
HEMOGLOBIN: 9.6 g/dL — AB (ref 13.0–17.0)
Immature Granulocytes: 0 %
LYMPHS ABS: 0.6 10*3/uL — AB (ref 0.7–4.0)
LYMPHS PCT: 11 %
MCH: 25.1 pg — AB (ref 26.0–34.0)
MCHC: 31.1 g/dL (ref 30.0–36.0)
MCV: 80.7 fL (ref 80.0–100.0)
MONO ABS: 0.6 10*3/uL (ref 0.1–1.0)
MONOS PCT: 10 %
Neutro Abs: 4.3 10*3/uL (ref 1.7–7.7)
Neutrophils Relative %: 75 %
Platelet Count: 194 10*3/uL (ref 150–400)
RBC: 3.83 MIL/uL — ABNORMAL LOW (ref 4.22–5.81)
RDW: 17.9 % — ABNORMAL HIGH (ref 11.5–15.5)
WBC Count: 5.7 10*3/uL (ref 4.0–10.5)
nRBC: 0 % (ref 0.0–0.2)

## 2017-12-27 LAB — CMP (CANCER CENTER ONLY)
ALBUMIN: 3.4 g/dL — AB (ref 3.5–5.0)
ALT: 20 U/L (ref 10–47)
AST: 21 U/L (ref 11–38)
Alkaline Phosphatase: 71 U/L (ref 26–84)
Anion gap: 11 (ref 5–15)
BUN: 24 mg/dL — AB (ref 7–22)
CHLORIDE: 102 mmol/L (ref 98–108)
CO2: 28 mmol/L (ref 18–33)
CREATININE: 2.7 mg/dL — AB (ref 0.60–1.20)
Calcium: 8.5 mg/dL (ref 8.0–10.3)
Glucose, Bld: 179 mg/dL — ABNORMAL HIGH (ref 73–118)
Potassium: 3.8 mmol/L (ref 3.3–4.7)
SODIUM: 141 mmol/L (ref 128–145)
Total Bilirubin: 0.6 mg/dL (ref 0.2–1.6)
Total Protein: 6.6 g/dL (ref 6.4–8.1)

## 2017-12-27 LAB — IRON AND TIBC
Iron: 56 ug/dL (ref 42–163)
SATURATION RATIOS: 26 % (ref 20–55)
TIBC: 214 ug/dL (ref 202–409)
UIBC: 158 ug/dL (ref 117–376)

## 2017-12-27 LAB — FERRITIN: Ferritin: 1009 ng/mL — ABNORMAL HIGH (ref 24–336)

## 2017-12-27 MED ORDER — SODIUM CHLORIDE 0.9% FLUSH
10.0000 mL | INTRAVENOUS | Status: DC | PRN
  Administered 2017-12-27: 10 mL via INTRAVENOUS
  Filled 2017-12-27: qty 10

## 2017-12-27 MED ORDER — DEXTROSE 5 % IV SOLN
29.0000 mg/m2 | Freq: Once | INTRAVENOUS | Status: AC
  Administered 2017-12-27: 60 mg via INTRAVENOUS
  Filled 2017-12-27: qty 30

## 2017-12-27 MED ORDER — HYDROMORPHONE HCL 1 MG/ML IJ SOLN
2.0000 mg | Freq: Once | INTRAMUSCULAR | Status: AC
  Administered 2017-12-27: 2 mg via INTRAVENOUS

## 2017-12-27 MED ORDER — SODIUM CHLORIDE 0.9 % IV SOLN
INTRAVENOUS | Status: DC
  Administered 2017-12-27: 09:00:00 via INTRAVENOUS
  Filled 2017-12-27: qty 250

## 2017-12-27 MED ORDER — HEPARIN SOD (PORK) LOCK FLUSH 100 UNIT/ML IV SOLN
500.0000 [IU] | Freq: Once | INTRAVENOUS | Status: DC | PRN
  Filled 2017-12-27: qty 5

## 2017-12-27 MED ORDER — HYDROMORPHONE HCL 1 MG/ML IJ SOLN
INTRAMUSCULAR | Status: AC
  Filled 2017-12-27: qty 2

## 2017-12-27 MED ORDER — PALONOSETRON HCL INJECTION 0.25 MG/5ML
0.2500 mg | Freq: Once | INTRAVENOUS | Status: AC
  Administered 2017-12-27: 0.25 mg via INTRAVENOUS

## 2017-12-27 MED ORDER — DEXAMETHASONE SODIUM PHOSPHATE 10 MG/ML IJ SOLN
INTRAMUSCULAR | Status: AC
  Filled 2017-12-27: qty 1

## 2017-12-27 MED ORDER — SODIUM CHLORIDE 0.9 % IV SOLN
60.0000 mg | Freq: Once | INTRAVENOUS | Status: AC
  Administered 2017-12-27: 60 mg via INTRAVENOUS
  Filled 2017-12-27: qty 20

## 2017-12-27 MED ORDER — SODIUM CHLORIDE 0.9% FLUSH
10.0000 mL | INTRAVENOUS | Status: DC | PRN
  Filled 2017-12-27: qty 10

## 2017-12-27 MED ORDER — SODIUM CHLORIDE 0.9 % IV SOLN
300.0000 mg/m2 | Freq: Once | INTRAVENOUS | Status: AC
  Administered 2017-12-27: 620 mg via INTRAVENOUS
  Filled 2017-12-27: qty 31

## 2017-12-27 MED ORDER — PALONOSETRON HCL INJECTION 0.25 MG/5ML
INTRAVENOUS | Status: AC
  Filled 2017-12-27: qty 5

## 2017-12-27 MED ORDER — DEXAMETHASONE SODIUM PHOSPHATE 10 MG/ML IJ SOLN
10.0000 mg | Freq: Once | INTRAMUSCULAR | Status: AC
  Administered 2017-12-27: 10 mg via INTRAVENOUS

## 2017-12-27 MED ORDER — HEPARIN SOD (PORK) LOCK FLUSH 100 UNIT/ML IV SOLN
500.0000 [IU] | Freq: Once | INTRAVENOUS | Status: AC
  Administered 2017-12-27: 500 [IU] via INTRAVENOUS
  Filled 2017-12-27: qty 5

## 2017-12-27 NOTE — Progress Notes (Signed)
Lab is having difficulty with computer issues and releasing today's lab orders - will proceed with [facility] based on 10/21 labs at this time per MD Ennever and will draw labs some time today to have for today.

## 2017-12-27 NOTE — Progress Notes (Signed)
OK to treat with today's CMET and CBC results per Dr. [name].

## 2017-12-28 LAB — PROTEIN ELECTROPHORESIS, SERUM
A/G RATIO SPE: 1.4 (ref 0.7–1.7)
ALPHA-2-GLOBULIN: 1 g/dL (ref 0.4–1.0)
Albumin ELP: 3.7 g/dL (ref 2.9–4.4)
Alpha-1-Globulin: 0.2 g/dL (ref 0.0–0.4)
BETA GLOBULIN: 0.9 g/dL (ref 0.7–1.3)
Gamma Globulin: 0.7 g/dL (ref 0.4–1.8)
Globulin, Total: 2.7 g/dL (ref 2.2–3.9)
M-SPIKE, %: 0.3 g/dL — AB
Total Protein ELP: 6.4 g/dL (ref 6.0–8.5)

## 2017-12-28 LAB — KAPPA/LAMBDA LIGHT CHAINS
Kappa free light chain: 27 mg/L — ABNORMAL HIGH (ref 3.3–19.4)
Kappa, lambda light chain ratio: 1.41 (ref 0.26–1.65)
LAMDA FREE LIGHT CHAINS: 19.2 mg/L (ref 5.7–26.3)

## 2017-12-28 LAB — IGG, IGA, IGM
IGG (IMMUNOGLOBIN G), SERUM: 831 mg/dL (ref 700–1600)
IgA: 161 mg/dL (ref 61–437)
IgM (Immunoglobulin M), Srm: 21 mg/dL (ref 20–172)

## 2017-12-28 LAB — TESTOSTERONE: Testosterone: 893 ng/dL (ref 264–916)

## 2018-01-03 ENCOUNTER — Inpatient Hospital Stay: Payer: Medicare Other

## 2018-01-03 DIAGNOSIS — C9000 Multiple myeloma not having achieved remission: Secondary | ICD-10-CM

## 2018-01-03 DIAGNOSIS — D631 Anemia in chronic kidney disease: Secondary | ICD-10-CM

## 2018-01-03 DIAGNOSIS — Z299 Encounter for prophylactic measures, unspecified: Secondary | ICD-10-CM

## 2018-01-03 DIAGNOSIS — E349 Endocrine disorder, unspecified: Secondary | ICD-10-CM

## 2018-01-03 DIAGNOSIS — N189 Chronic kidney disease, unspecified: Principal | ICD-10-CM

## 2018-01-03 DIAGNOSIS — Z5112 Encounter for antineoplastic immunotherapy: Secondary | ICD-10-CM | POA: Diagnosis not present

## 2018-01-03 LAB — CBC WITH DIFFERENTIAL (CANCER CENTER ONLY)
ABS IMMATURE GRANULOCYTES: 0.03 10*3/uL (ref 0.00–0.07)
BASOS ABS: 0 10*3/uL (ref 0.0–0.1)
BASOS PCT: 0 %
EOS ABS: 0.1 10*3/uL (ref 0.0–0.5)
Eosinophils Relative: 3 %
HCT: 33.1 % — ABNORMAL LOW (ref 39.0–52.0)
Hemoglobin: 10.2 g/dL — ABNORMAL LOW (ref 13.0–17.0)
Immature Granulocytes: 1 %
Lymphocytes Relative: 14 %
Lymphs Abs: 0.7 10*3/uL (ref 0.7–4.0)
MCH: 24.9 pg — ABNORMAL LOW (ref 26.0–34.0)
MCHC: 30.8 g/dL (ref 30.0–36.0)
MCV: 80.7 fL (ref 80.0–100.0)
MONOS PCT: 15 %
Monocytes Absolute: 0.7 10*3/uL (ref 0.1–1.0)
NEUTROS ABS: 3.2 10*3/uL (ref 1.7–7.7)
NEUTROS PCT: 67 %
NRBC: 0 % (ref 0.0–0.2)
PLATELETS: 147 10*3/uL — AB (ref 150–400)
RBC: 4.1 MIL/uL — AB (ref 4.22–5.81)
RDW: 17.5 % — AB (ref 11.5–15.5)
WBC Count: 4.7 10*3/uL (ref 4.0–10.5)

## 2018-01-03 LAB — CMP (CANCER CENTER ONLY)
ALBUMIN: 3.6 g/dL (ref 3.5–5.0)
ALT: 30 U/L (ref 10–47)
ANION GAP: 12 (ref 5–15)
AST: 21 U/L (ref 11–38)
Alkaline Phosphatase: 72 U/L (ref 26–84)
BUN: 23 mg/dL — AB (ref 7–22)
CALCIUM: 9.1 mg/dL (ref 8.0–10.3)
CO2: 29 mmol/L (ref 18–33)
CREATININE: 2.5 mg/dL — AB (ref 0.60–1.20)
Chloride: 102 mmol/L (ref 98–108)
GLUCOSE: 159 mg/dL — AB (ref 73–118)
Potassium: 3.9 mmol/L (ref 3.3–4.7)
SODIUM: 143 mmol/L (ref 128–145)
TOTAL PROTEIN: 7.3 g/dL (ref 6.4–8.1)
Total Bilirubin: 0.7 mg/dL (ref 0.2–1.6)

## 2018-01-03 MED ORDER — DEXAMETHASONE SODIUM PHOSPHATE 10 MG/ML IJ SOLN
10.0000 mg | Freq: Once | INTRAMUSCULAR | Status: AC
  Administered 2018-01-03: 10 mg via INTRAVENOUS

## 2018-01-03 MED ORDER — SODIUM CHLORIDE 0.9 % IV SOLN
300.0000 mg/m2 | Freq: Once | INTRAVENOUS | Status: AC
  Administered 2018-01-03: 620 mg via INTRAVENOUS
  Filled 2018-01-03: qty 31

## 2018-01-03 MED ORDER — PALONOSETRON HCL INJECTION 0.25 MG/5ML
0.2500 mg | Freq: Once | INTRAVENOUS | Status: AC
  Administered 2018-01-03: 0.25 mg via INTRAVENOUS

## 2018-01-03 MED ORDER — HEPARIN SOD (PORK) LOCK FLUSH 100 UNIT/ML IV SOLN
500.0000 [IU] | Freq: Once | INTRAVENOUS | Status: AC | PRN
  Administered 2018-01-03: 500 [IU]
  Filled 2018-01-03: qty 5

## 2018-01-03 MED ORDER — DEXTROSE 5 % IV SOLN
29.0000 mg/m2 | Freq: Once | INTRAVENOUS | Status: AC
  Administered 2018-01-03: 60 mg via INTRAVENOUS
  Filled 2018-01-03: qty 30

## 2018-01-03 MED ORDER — SODIUM CHLORIDE 0.9% FLUSH
10.0000 mL | INTRAVENOUS | Status: DC | PRN
  Administered 2018-01-03: 10 mL
  Filled 2018-01-03: qty 10

## 2018-01-03 MED ORDER — DEXAMETHASONE SODIUM PHOSPHATE 10 MG/ML IJ SOLN
INTRAMUSCULAR | Status: AC
  Filled 2018-01-03: qty 1

## 2018-01-03 MED ORDER — PALONOSETRON HCL INJECTION 0.25 MG/5ML
INTRAVENOUS | Status: AC
  Filled 2018-01-03: qty 5

## 2018-01-03 MED ORDER — HYDROMORPHONE HCL 1 MG/ML IJ SOLN
2.0000 mg | Freq: Once | INTRAMUSCULAR | Status: AC
  Administered 2018-01-03: 2 mg via INTRAVENOUS

## 2018-01-03 MED ORDER — SODIUM CHLORIDE 0.9 % IV SOLN
Freq: Once | INTRAVENOUS | Status: AC
  Administered 2018-01-03: 09:00:00 via INTRAVENOUS
  Filled 2018-01-03: qty 250

## 2018-01-03 MED ORDER — HYDROMORPHONE HCL 4 MG/ML IJ SOLN
INTRAMUSCULAR | Status: AC
  Filled 2018-01-03: qty 1

## 2018-01-03 NOTE — Progress Notes (Signed)
0930. Patient sleeping, no longer c/o pain.

## 2018-01-10 ENCOUNTER — Other Ambulatory Visit: Payer: Self-pay

## 2018-01-10 ENCOUNTER — Encounter: Payer: Self-pay | Admitting: Hematology & Oncology

## 2018-01-10 ENCOUNTER — Inpatient Hospital Stay (HOSPITAL_BASED_OUTPATIENT_CLINIC_OR_DEPARTMENT_OTHER): Payer: Medicare Other | Admitting: Hematology & Oncology

## 2018-01-10 ENCOUNTER — Inpatient Hospital Stay: Payer: Medicare Other

## 2018-01-10 VITALS — BP 129/68 | HR 104 | Temp 98.2°F | Resp 18 | Wt 194.0 lb

## 2018-01-10 DIAGNOSIS — E349 Endocrine disorder, unspecified: Secondary | ICD-10-CM

## 2018-01-10 DIAGNOSIS — D631 Anemia in chronic kidney disease: Secondary | ICD-10-CM

## 2018-01-10 DIAGNOSIS — Z299 Encounter for prophylactic measures, unspecified: Secondary | ICD-10-CM

## 2018-01-10 DIAGNOSIS — N189 Chronic kidney disease, unspecified: Secondary | ICD-10-CM

## 2018-01-10 DIAGNOSIS — D508 Other iron deficiency anemias: Secondary | ICD-10-CM

## 2018-01-10 DIAGNOSIS — E1121 Type 2 diabetes mellitus with diabetic nephropathy: Secondary | ICD-10-CM | POA: Diagnosis not present

## 2018-01-10 DIAGNOSIS — D509 Iron deficiency anemia, unspecified: Secondary | ICD-10-CM

## 2018-01-10 DIAGNOSIS — C9000 Multiple myeloma not having achieved remission: Secondary | ICD-10-CM

## 2018-01-10 DIAGNOSIS — M4712 Other spondylosis with myelopathy, cervical region: Secondary | ICD-10-CM

## 2018-01-10 DIAGNOSIS — Z5112 Encounter for antineoplastic immunotherapy: Secondary | ICD-10-CM | POA: Diagnosis not present

## 2018-01-10 LAB — CMP (CANCER CENTER ONLY)
ALK PHOS: 67 U/L (ref 26–84)
ALT: 25 U/L (ref 10–47)
ANION GAP: 14 (ref 5–15)
AST: 24 U/L (ref 11–38)
Albumin: 3.9 g/dL (ref 3.5–5.0)
BILIRUBIN TOTAL: 0.7 mg/dL (ref 0.2–1.6)
BUN: 34 mg/dL — ABNORMAL HIGH (ref 7–22)
CALCIUM: 9.6 mg/dL (ref 8.0–10.3)
CO2: 27 mmol/L (ref 18–33)
CREATININE: 2.6 mg/dL — AB (ref 0.60–1.20)
Chloride: 102 mmol/L (ref 98–108)
GLUCOSE: 165 mg/dL — AB (ref 73–118)
Potassium: 4.6 mmol/L (ref 3.3–4.7)
Sodium: 143 mmol/L (ref 128–145)
Total Protein: 7.5 g/dL (ref 6.4–8.1)

## 2018-01-10 LAB — CBC WITH DIFFERENTIAL (CANCER CENTER ONLY)
ABS IMMATURE GRANULOCYTES: 0.03 10*3/uL (ref 0.00–0.07)
Basophils Absolute: 0 10*3/uL (ref 0.0–0.1)
Basophils Relative: 0 %
Eosinophils Absolute: 0.1 10*3/uL (ref 0.0–0.5)
Eosinophils Relative: 2 %
HEMATOCRIT: 33.8 % — AB (ref 39.0–52.0)
Hemoglobin: 10.6 g/dL — ABNORMAL LOW (ref 13.0–17.0)
IMMATURE GRANULOCYTES: 1 %
LYMPHS ABS: 0.6 10*3/uL — AB (ref 0.7–4.0)
Lymphocytes Relative: 11 %
MCH: 25.5 pg — AB (ref 26.0–34.0)
MCHC: 31.4 g/dL (ref 30.0–36.0)
MCV: 81.4 fL (ref 80.0–100.0)
MONO ABS: 0.5 10*3/uL (ref 0.1–1.0)
MONOS PCT: 9 %
NEUTROS ABS: 4.7 10*3/uL (ref 1.7–7.7)
Neutrophils Relative %: 77 %
Platelet Count: 154 10*3/uL (ref 150–400)
RBC: 4.15 MIL/uL — ABNORMAL LOW (ref 4.22–5.81)
RDW: 17.2 % — ABNORMAL HIGH (ref 11.5–15.5)
WBC Count: 6 10*3/uL (ref 4.0–10.5)
nRBC: 0 % (ref 0.0–0.2)

## 2018-01-10 LAB — FERRITIN: Ferritin: 1631 ng/mL — ABNORMAL HIGH (ref 24–336)

## 2018-01-10 LAB — RETICULOCYTES
Immature Retic Fract: 6.9 % (ref 2.3–15.9)
RBC.: 4.15 MIL/uL — AB (ref 4.22–5.81)
RETIC COUNT ABSOLUTE: 45.2 10*3/uL (ref 19.0–186.0)
RETIC CT PCT: 1.1 % (ref 0.4–3.1)

## 2018-01-10 LAB — IRON AND TIBC
Iron: 75 ug/dL (ref 42–163)
SATURATION RATIOS: 31 % (ref 20–55)
TIBC: 245 ug/dL (ref 202–409)
UIBC: 170 ug/dL (ref 117–376)

## 2018-01-10 MED ORDER — DEXAMETHASONE SODIUM PHOSPHATE 10 MG/ML IJ SOLN
10.0000 mg | Freq: Once | INTRAMUSCULAR | Status: AC
  Administered 2018-01-10: 10 mg via INTRAVENOUS

## 2018-01-10 MED ORDER — SODIUM CHLORIDE 0.9 % IV SOLN
300.0000 mg/m2 | Freq: Once | INTRAVENOUS | Status: AC
  Administered 2018-01-10: 620 mg via INTRAVENOUS
  Filled 2018-01-10: qty 31

## 2018-01-10 MED ORDER — SODIUM CHLORIDE 0.9% FLUSH
10.0000 mL | INTRAVENOUS | Status: DC | PRN
  Administered 2018-01-10: 10 mL
  Filled 2018-01-10: qty 10

## 2018-01-10 MED ORDER — DEXTROSE 5 % IV SOLN
29.0000 mg/m2 | Freq: Once | INTRAVENOUS | Status: AC
  Administered 2018-01-10: 60 mg via INTRAVENOUS
  Filled 2018-01-10: qty 30

## 2018-01-10 MED ORDER — DARBEPOETIN ALFA 300 MCG/0.6ML IJ SOSY
300.0000 ug | PREFILLED_SYRINGE | Freq: Once | INTRAMUSCULAR | Status: AC
  Administered 2018-01-10: 300 ug via SUBCUTANEOUS

## 2018-01-10 MED ORDER — TESTOSTERONE CYPIONATE 200 MG/ML IM SOLN
INTRAMUSCULAR | Status: AC
  Filled 2018-01-10: qty 2

## 2018-01-10 MED ORDER — HEPARIN SOD (PORK) LOCK FLUSH 100 UNIT/ML IV SOLN
500.0000 [IU] | Freq: Once | INTRAVENOUS | Status: AC | PRN
  Administered 2018-01-10: 500 [IU]
  Filled 2018-01-10: qty 5

## 2018-01-10 MED ORDER — PALONOSETRON HCL INJECTION 0.25 MG/5ML
INTRAVENOUS | Status: AC
  Filled 2018-01-10: qty 5

## 2018-01-10 MED ORDER — DEXAMETHASONE SODIUM PHOSPHATE 10 MG/ML IJ SOLN
INTRAMUSCULAR | Status: AC
  Filled 2018-01-10: qty 1

## 2018-01-10 MED ORDER — SODIUM CHLORIDE 0.9 % IV SOLN
Freq: Once | INTRAVENOUS | Status: AC
  Administered 2018-01-10: 10:00:00 via INTRAVENOUS
  Filled 2018-01-10: qty 250

## 2018-01-10 MED ORDER — HYDROMORPHONE HCL 1 MG/ML IJ SOLN
INTRAMUSCULAR | Status: AC
  Filled 2018-01-10: qty 2

## 2018-01-10 MED ORDER — TESTOSTERONE CYPIONATE 200 MG/ML IM SOLN
400.0000 mg | Freq: Once | INTRAMUSCULAR | Status: AC
  Administered 2018-01-10: 400 mg via INTRAMUSCULAR

## 2018-01-10 MED ORDER — PALONOSETRON HCL INJECTION 0.25 MG/5ML
0.2500 mg | Freq: Once | INTRAVENOUS | Status: AC
  Administered 2018-01-10: 0.25 mg via INTRAVENOUS

## 2018-01-10 MED ORDER — HYDROMORPHONE HCL 1 MG/ML IJ SOLN
2.0000 mg | Freq: Once | INTRAMUSCULAR | Status: AC
  Administered 2018-01-10: 2 mg via INTRAVENOUS

## 2018-01-10 MED ORDER — DARBEPOETIN ALFA 300 MCG/0.6ML IJ SOSY
PREFILLED_SYRINGE | INTRAMUSCULAR | Status: AC
  Filled 2018-01-10: qty 0.6

## 2018-01-10 NOTE — Patient Instructions (Signed)
[facility] Discharge Instructions for Patients Receiving Chemotherapy  Today you received the following chemotherapy agents Cytoxan and Kyprolis. Also had testosterone and Aranesp.  To help prevent nausea and vomiting after your treatment, we encourage you to take your nausea medication as directed but NO ZOFRAN FOR 3 DAYS AFTER CHEMO.   If you develop nausea and vomiting that is not controlled by your nausea medication, call the clinic.   BELOW ARE SYMPTOMS THAT SHOULD BE REPORTED IMMEDIATELY:  *FEVER GREATER THAN 100.5 F  *CHILLS WITH OR WITHOUT FEVER  NAUSEA AND VOMITING THAT IS NOT CONTROLLED WITH YOUR NAUSEA MEDICATION  *UNUSUAL SHORTNESS OF BREATH  *UNUSUAL BRUISING OR BLEEDING  TENDERNESS IN MOUTH AND THROAT WITH OR WITHOUT PRESENCE OF ULCERS  *URINARY PROBLEMS  *BOWEL PROBLEMS  UNUSUAL RASH Items with * indicate a potential emergency and should be followed up as soon as possible.  Feel free to call the clinic you have any questions or concerns. The clinic phone number is [phone].  Please show the [place] at check-in to the Emergency Department and triage nurse.

## 2018-01-10 NOTE — Addendum Note (Signed)
Addended by: Burney Gauze R on: 01/10/2018 11:21 AM   Modules accepted: Orders

## 2018-01-10 NOTE — Progress Notes (Signed)
Hematology and Oncology Follow Up Visit  [name] [id number] February 26, 1950 67 y.o. 01/10/2018   Principle Diagnosis:  IgG kappa myeloma Anemia secondary to renal insufficiency Intermittent iron - deficiency anemia Hypotestosteronemia  Current Therapy:   Aredia 60 mg IV q 3 months-- next dose 03/2018 Aranesp 300 mcg subcu as needed for hemoglobin less than 11 DepoTestosterone400 mg q 4 weeks IV iron as indicated Kyprolis/Cytoxan - s/p cycle#12   Interim History:  [name] is here today for follow-up and treatment.  So far, he is hanging pretty well.  He is mostly bothered by his fibromyalgia.  He has fibromyalgia pretty bad.  He is on pain medication for this.  His diabetes is been doing okay I think.  He does have diabetic nephropathy.  His creatinine has been trending up slowly.  We will have to watch this very closely.  His myeloma studies couple weeks ago showed his M spike to be 0.3 g/dL.  His IgG level was 831 mg/dL.  His kappa light chain was 2.7 mg/dL.  He has had no bleeding.  He has had no fever.  He has had no rashes.  He has had no change in bowel or bladder habits.  Overall, his performance status is ECOG 1.  His testosterone level is markedly improved.  We adjusted his testosterone dose to 400 mg.  He has testosterone level checked couple weeks ago.  The testosterone level was 900.   Overall, his performance status is ECOG 1.  Medications:  Allergies as of 01/10/2018      Reactions   Iodinated Diagnostic Agents Rash   Patient states he was instructed not to take IV contrast.  In 2008 he had an unknown reaction, and was told not to take it again.  He was also told not to take it due to his kidneys.   Iodine Anxiety, Rash, Other (See Comments)   Didn't feel right "instructed not to take per MD--something with his port" Didn't feel right Didn't feel right "instructed not to take per MD--something with his port"      Medication List         Accurate as of 01/10/18 10:45 AM. Always use your most recent med list.          amLODipine 10 MG tablet Commonly known as:  NORVASC Take 10 mg by mouth daily.   CARAFATE 1 GM/10ML suspension Generic drug:  sucralfate Take 1 g by mouth 4 (four) times daily.   cefdinir 300 MG capsule Commonly known as:  OMNICEF Take 1 capsule (300 mg total) by mouth 2 (two) times daily.   ciclopirox 8 % solution Commonly known as:  PENLAC APP QD. REMOVE WEEKLY WITH NAIL POLISH REMOVER THEN REAPPLY   CLEAR EYES ALL SEASONS 5-6 MG/ML Soln Generic drug:  Polyvinyl Alcohol-Povidone Place 2 drops into both eyes 3 (three) times daily as needed (drye eyes.).   cyclobenzaprine 10 MG tablet Commonly known as:  FLEXERIL Take 10 mg by mouth 3 (three) times daily.   dicyclomine 10 MG capsule Commonly known as:  BENTYL TAKE ONE CAPSULE BY MOUTH 3 TIMES A DAY   dronabinol 2.5 MG capsule Commonly known as:  MARINOL Take 1 capsule (2.5 mg total) by mouth 2 (two) times daily before a meal.   EMBEDA 30-1.2 MG Cpcr Generic drug:  Morphine-Naltrexone Take 3 (three) times daily by mouth.   EMBEDA 20-0.8 MG Cpcr Generic drug:  Morphine-Naltrexone TAKE 1 CAPSULE BY MOUTH THREE TIMES A DAY   furosemide  40 MG tablet Commonly known as:  LASIX Take 40 mg by mouth.   glipiZIDE 5 MG tablet Commonly known as:  GLUCOTROL Take 5 mg by mouth 2 (two) times daily before a meal.   isosorbide mononitrate 30 MG 24 hr tablet Commonly known as:  IMDUR Take 30 mg by mouth daily.   latanoprost 0.005 % ophthalmic solution Commonly known as:  XALATAN Place 1 drop into the right eye every evening.   lidocaine-prilocaine cream Commonly known as:  EMLA Apply 1 application topically as needed.   linaclotide 145 MCG Caps capsule Commonly known as:  LINZESS Take 1 capsule (145 mcg total) by mouth daily before breakfast.   LORazepam 0.5 MG tablet Commonly known as:  ATIVAN Take 1 tablet (0.5 mg total) by mouth  every 6 (six) hours as needed (Nausea or vomiting).   losartan 25 MG tablet Commonly known as:  COZAAR Take 25 mg by mouth.   meclizine 50 MG tablet Commonly known as:  ANTIVERT Take 0.5 tablets (25 mg total) by mouth 4 (four) times daily as needed for dizziness.   methocarbamol 500 MG tablet Commonly known as:  ROBAXIN Take 500 mg by mouth 4 (four) times daily.   metoprolol succinate 100 MG 24 hr tablet Commonly known as:  TOPROL-XL Take by mouth.   mirabegron ER 50 MG Tb24 tablet Commonly known as:  MYRBETRIQ Take 50 mg by mouth daily.   omeprazole 40 MG capsule Commonly known as:  PRILOSEC Take 1 capsule (40 mg total) by mouth 2 (two) times daily.   ondansetron 8 MG tablet Commonly known as:  ZOFRAN TAKE 1 TABLET(8 MG) BY MOUTH TWICE DAILY AS NEEDED FOR NAUSEA OR VOMITING   orphenadrine 100 MG tablet Commonly known as:  NORFLEX Take 1 tablet (100 mg total) by mouth 2 (two) times daily.   oxyCODONE 20 mg 12 hr tablet Commonly known as:  OXYCONTIN Take 1 tablet (20 mg total) by mouth every 12 (twelve) hours.   oxyCODONE-acetaminophen 10-325 MG tablet Commonly known as:  PERCOCET Take 1 tablet by mouth every 6 (six) hours as needed for pain.   potassium chloride SA 20 MEQ tablet Commonly known as:  K-DUR,KLOR-CON Take 1 tablet (20 mEq total) by mouth 2 (two) times daily.   prochlorperazine 10 MG tablet Commonly known as:  COMPAZINE take 1 tablet by mouth every 6 hours if needed for nausea and vomiting   Sennosides 25 MG Tabs Take by mouth.   traMADol 50 MG tablet Commonly known as:  ULTRAM TAKE 1 TABLET(50 MG) BY MOUTH EVERY 6 HOURS AS NEEDED   Vitamin D (Ergocalciferol) 1.25 MG (50000 UT) Caps capsule Commonly known as:  DRISDOL Take 50,000 Units by mouth every Saturday.   ZIOPTAN 0.0015 % Soln Generic drug:  Tafluprost Place 1 drop into both eyes daily.       Allergies:  Allergies  Allergen Reactions  . Iodinated Diagnostic Agents Rash     Patient states he was instructed not to take IV contrast.  In 2008 he had an unknown reaction, and was told not to take it again.  He was also told not to take it due to his kidneys.  . Iodine Anxiety, Rash and Other (See Comments)    Didn't feel right "instructed not to take per MD--something with his port" Didn't feel right Didn't feel right "instructed not to take per MD--something with his port"    Past Medical History, Surgical history, Social history, and Family History were reviewed and  updated.  Review of Systems: Review of Systems  Constitutional: Negative.   HENT: Negative.   Eyes: Negative.   Respiratory: Negative.   Cardiovascular: Negative.   Gastrointestinal: Negative.   Genitourinary: Negative.   Musculoskeletal: Positive for back pain, joint pain and myalgias.  Skin: Negative.   Neurological: Negative.   Endo/Heme/Allergies: Negative.   Psychiatric/Behavioral: Negative.      Physical Exam:  weight is 194 lb (88 kg). His oral temperature is 98.2 F (36.8 C). His blood pressure is 129/68 and his pulse is 104 (abnormal). His respiration is 18.   Wt Readings from Last 3 Encounters:  01/10/18 194 lb (88 kg)  12/13/17 198 lb (89.8 kg)  11/29/17 200 lb 8 oz (90.9 kg)    Physical Exam  Constitutional: He is oriented to person, place, and time.  HENT:  Head: Normocephalic and atraumatic.  Mouth/Throat: Oropharynx is clear and moist.  Eyes: Pupils are equal, round, and reactive to light. EOM are normal.  Neck: Normal range of motion.  Cardiovascular: Normal rate, regular rhythm and normal heart sounds.  Pulmonary/Chest: Effort normal and breath sounds normal.  Abdominal: Soft. Bowel sounds are normal.  Musculoskeletal: Normal range of motion. He exhibits no edema, tenderness or deformity.  Lymphadenopathy:    He has no cervical adenopathy.  Neurological: He is alert and oriented to person, place, and time.  Skin: Skin is warm and dry. No rash noted. No  erythema.  Psychiatric: He has a normal mood and affect. His behavior is normal. Judgment and thought content normal.  Vitals reviewed.    Lab Results  Component Value Date   WBC 6.0 01/10/2018   HGB 10.6 (L) 01/10/2018   HCT 33.8 (L) 01/10/2018   MCV 81.4 01/10/2018   PLT 154 01/10/2018   Lab Results  Component Value Date   FERRITIN 1,009 (H) 12/27/2017   IRON 56 12/27/2017   TIBC 214 12/27/2017   UIBC 158 12/27/2017   IRONPCTSAT 26 12/27/2017   Lab Results  Component Value Date   RETICCTPCT 1.1 01/10/2018   RBC 4.15 (L) 01/10/2018   RBC 4.15 (L) 01/10/2018   RETICCTABS 27.5 03/21/2014   Lab Results  Component Value Date   KPAFRELGTCHN 27.0 (H) 12/27/2017   LAMBDASER 19.2 12/27/2017   KAPLAMBRATIO 1.41 12/27/2017   Lab Results  Component Value Date   IGGSERUM 831 12/27/2017   IGA 161 12/27/2017   IGMSERUM 21 12/27/2017   Lab Results  Component Value Date   TOTALPROTELP 6.4 12/27/2017   ALBUMINELP 3.7 12/27/2017   A1GS 0.2 12/27/2017   A2GS 1.0 12/27/2017   BETS 0.9 12/27/2017   BETA2SER 0.5 11/07/2014   GAMS 0.7 12/27/2017   MSPIKE 0.3 (H) 12/27/2017   SPEI Comment 12/27/2017     Chemistry      Component Value Date/Time   NA 143 01/10/2018 0845   NA 143 02/22/2017 0744   K 4.6 01/10/2018 0845   K 4.0 02/22/2017 0744   CL 102 01/10/2018 0845   CL 106 02/22/2017 0744   CO2 27 01/10/2018 0845   CO2 27 02/22/2017 0744   BUN 34 (H) 01/10/2018 0845   BUN 26 (H) 02/22/2017 0744   CREATININE 2.60 (H) 01/10/2018 0845   CREATININE 2.3 (H) 02/22/2017 0744      Component Value Date/Time   CALCIUM 9.6 01/10/2018 0845   CALCIUM 9.3 02/22/2017 0744   ALKPHOS 67 01/10/2018 0845   ALKPHOS 80 02/22/2017 0744   AST 24 01/10/2018 0845   ALT 25  01/10/2018 0845   ALT 22 02/22/2017 0744   BILITOT 0.7 01/10/2018 0845      Impression and Plan: Ms. Behan is a very pleasant 67 yo African American gentleman with IgG kappa myeloma.   I am still told by the  fact that his renal function is low but stable.  I would really like to see his renal function improve.  I do not think that the renal function is being affected by his treatments.  I am not sure if he sees a family doctor or if he sees a nephrologist.  I am going to change his Aredia to every 3 months for right now.  I suppose this might be a factor with renal function.  We will have to watch him closely.  We will have to have him come back to see [place] in another few weeks.   Volanda Napoleon, MD 11/18/201910:45 AM

## 2018-01-10 NOTE — Progress Notes (Signed)
OK to treat with labs per MD Ennever today

## 2018-01-11 LAB — PROTEIN ELECTROPHORESIS, SERUM, WITH REFLEX
A/G Ratio: 1.2 (ref 0.7–1.7)
Albumin ELP: 3.8 g/dL (ref 2.9–4.4)
Alpha-1-Globulin: 0.2 g/dL (ref 0.0–0.4)
Alpha-2-Globulin: 1.1 g/dL — ABNORMAL HIGH (ref 0.4–1.0)
Beta Globulin: 0.9 g/dL (ref 0.7–1.3)
Gamma Globulin: 0.8 g/dL (ref 0.4–1.8)
Globulin, Total: 3.1 g/dL (ref 2.2–3.9)
Total Protein ELP: 6.9 g/dL (ref 6.0–8.5)

## 2018-01-11 LAB — IGG, IGA, IGM
IgA: 165 mg/dL (ref 61–437)
IgG (Immunoglobin G), Serum: 912 mg/dL (ref 700–1600)
IgM (Immunoglobulin M), Srm: 21 mg/dL (ref 20–172)

## 2018-01-11 LAB — KAPPA/LAMBDA LIGHT CHAINS
KAPPA FREE LGHT CHN: 30.9 mg/L — AB (ref 3.3–19.4)
KAPPA, LAMDA LIGHT CHAIN RATIO: 1.53 (ref 0.26–1.65)
Lambda free light chains: 20.2 mg/L (ref 5.7–26.3)

## 2018-01-24 ENCOUNTER — Inpatient Hospital Stay: Payer: Medicare Other

## 2018-01-24 ENCOUNTER — Encounter: Payer: Self-pay | Admitting: Family

## 2018-01-24 ENCOUNTER — Other Ambulatory Visit: Payer: Self-pay

## 2018-01-24 ENCOUNTER — Inpatient Hospital Stay: Payer: Medicare Other | Attending: Hematology & Oncology | Admitting: Family

## 2018-01-24 VITALS — BP 146/73 | HR 76 | Temp 98.3°F | Wt 201.8 lb

## 2018-01-24 DIAGNOSIS — Z299 Encounter for prophylactic measures, unspecified: Secondary | ICD-10-CM

## 2018-01-24 DIAGNOSIS — N189 Chronic kidney disease, unspecified: Secondary | ICD-10-CM | POA: Diagnosis not present

## 2018-01-24 DIAGNOSIS — D631 Anemia in chronic kidney disease: Secondary | ICD-10-CM | POA: Diagnosis not present

## 2018-01-24 DIAGNOSIS — E349 Endocrine disorder, unspecified: Secondary | ICD-10-CM

## 2018-01-24 DIAGNOSIS — Z452 Encounter for adjustment and management of vascular access device: Secondary | ICD-10-CM | POA: Diagnosis not present

## 2018-01-24 DIAGNOSIS — Z5112 Encounter for antineoplastic immunotherapy: Secondary | ICD-10-CM | POA: Insufficient documentation

## 2018-01-24 DIAGNOSIS — Z5111 Encounter for antineoplastic chemotherapy: Secondary | ICD-10-CM | POA: Insufficient documentation

## 2018-01-24 DIAGNOSIS — D508 Other iron deficiency anemias: Secondary | ICD-10-CM

## 2018-01-24 DIAGNOSIS — C9000 Multiple myeloma not having achieved remission: Secondary | ICD-10-CM

## 2018-01-24 DIAGNOSIS — D509 Iron deficiency anemia, unspecified: Secondary | ICD-10-CM | POA: Diagnosis not present

## 2018-01-24 DIAGNOSIS — M4712 Other spondylosis with myelopathy, cervical region: Secondary | ICD-10-CM

## 2018-01-24 LAB — CBC WITH DIFFERENTIAL (CANCER CENTER ONLY)
Abs Immature Granulocytes: 0.02 10*3/uL (ref 0.00–0.07)
BASOS ABS: 0 10*3/uL (ref 0.0–0.1)
Basophils Relative: 1 %
EOS ABS: 0.1 10*3/uL (ref 0.0–0.5)
EOS PCT: 3 %
HEMATOCRIT: 34.1 % — AB (ref 39.0–52.0)
Hemoglobin: 10.4 g/dL — ABNORMAL LOW (ref 13.0–17.0)
Immature Granulocytes: 0 %
LYMPHS ABS: 0.5 10*3/uL — AB (ref 0.7–4.0)
Lymphocytes Relative: 10 %
MCH: 25.4 pg — AB (ref 26.0–34.0)
MCHC: 30.5 g/dL (ref 30.0–36.0)
MCV: 83.4 fL (ref 80.0–100.0)
MONO ABS: 0.7 10*3/uL (ref 0.1–1.0)
Monocytes Relative: 13 %
NRBC: 0 % (ref 0.0–0.2)
Neutro Abs: 4 10*3/uL (ref 1.7–7.7)
Neutrophils Relative %: 73 %
Platelet Count: 175 10*3/uL (ref 150–400)
RBC: 4.09 MIL/uL — AB (ref 4.22–5.81)
RDW: 18.2 % — AB (ref 11.5–15.5)
WBC: 5.5 10*3/uL (ref 4.0–10.5)

## 2018-01-24 LAB — IRON AND TIBC
IRON: 62 ug/dL (ref 42–163)
Saturation Ratios: 29 % (ref 20–55)
TIBC: 214 ug/dL (ref 202–409)
UIBC: 152 ug/dL (ref 117–376)

## 2018-01-24 LAB — CMP (CANCER CENTER ONLY)
ALT: 10 U/L (ref 0–44)
ANION GAP: 9 (ref 5–15)
AST: 12 U/L — ABNORMAL LOW (ref 15–41)
Albumin: 4 g/dL (ref 3.5–5.0)
Alkaline Phosphatase: 57 U/L (ref 38–126)
BUN: 20 mg/dL (ref 8–23)
CHLORIDE: 101 mmol/L (ref 98–111)
CO2: 28 mmol/L (ref 22–32)
Calcium: 8.4 mg/dL — ABNORMAL LOW (ref 8.9–10.3)
Creatinine: 2.45 mg/dL — ABNORMAL HIGH (ref 0.61–1.24)
GFR, Est AFR Am: 30 mL/min — ABNORMAL LOW (ref >60)
GFR, Estimated: 26 mL/min — ABNORMAL LOW (ref >60)
Glucose, Bld: 207 mg/dL — ABNORMAL HIGH (ref 70–99)
Potassium: 4.3 mmol/L (ref 3.5–5.1)
Sodium: 138 mmol/L (ref 135–145)
Total Bilirubin: 0.4 mg/dL (ref 0.3–1.2)
Total Protein: 6.3 g/dL — ABNORMAL LOW (ref 6.5–8.1)

## 2018-01-24 LAB — RETICULOCYTES
Immature Retic Fract: 30 % — ABNORMAL HIGH (ref 2.3–15.9)
RBC.: 4.09 MIL/uL — ABNORMAL LOW (ref 4.22–5.81)
RETIC CT PCT: 3.6 % — AB (ref 0.4–3.1)
Retic Count, Absolute: 148.1 10*3/uL (ref 19.0–186.0)

## 2018-01-24 LAB — LACTATE DEHYDROGENASE: LDH: 249 U/L — ABNORMAL HIGH (ref 98–192)

## 2018-01-24 LAB — FERRITIN: Ferritin: 912 ng/mL — ABNORMAL HIGH (ref 24–336)

## 2018-01-24 MED ORDER — PALONOSETRON HCL INJECTION 0.25 MG/5ML
INTRAVENOUS | Status: AC
  Filled 2018-01-24: qty 5

## 2018-01-24 MED ORDER — SODIUM CHLORIDE 0.9 % IV SOLN
Freq: Once | INTRAVENOUS | Status: AC
  Administered 2018-01-24: 10:00:00 via INTRAVENOUS
  Filled 2018-01-24: qty 250

## 2018-01-24 MED ORDER — SODIUM CHLORIDE 0.9 % IV SOLN
300.0000 mg/m2 | Freq: Once | INTRAVENOUS | Status: AC
  Administered 2018-01-24: 620 mg via INTRAVENOUS
  Filled 2018-01-24: qty 31

## 2018-01-24 MED ORDER — SODIUM CHLORIDE 0.9% FLUSH
10.0000 mL | Freq: Once | INTRAVENOUS | Status: AC
  Administered 2018-01-24: 10 mL
  Filled 2018-01-24: qty 10

## 2018-01-24 MED ORDER — DEXAMETHASONE SODIUM PHOSPHATE 10 MG/ML IJ SOLN
10.0000 mg | Freq: Once | INTRAMUSCULAR | Status: AC
  Administered 2018-01-24: 10 mg via INTRAVENOUS

## 2018-01-24 MED ORDER — SODIUM CHLORIDE 0.9 % IV SOLN: 60.0000 mg | Freq: Once | INTRAVENOUS | Status: DC

## 2018-01-24 MED ORDER — DEXTROSE 5 % IV SOLN
29.0000 mg/m2 | Freq: Once | INTRAVENOUS | Status: AC
  Administered 2018-01-24: 60 mg via INTRAVENOUS
  Filled 2018-01-24: qty 30

## 2018-01-24 MED ORDER — HYDROMORPHONE HCL 1 MG/ML IJ SOLN
2.0000 mg | Freq: Once | INTRAMUSCULAR | Status: AC
  Administered 2018-01-24: 2 mg via INTRAVENOUS

## 2018-01-24 MED ORDER — HYDROMORPHONE HCL 1 MG/ML IJ SOLN
INTRAMUSCULAR | Status: AC
  Filled 2018-01-24: qty 2

## 2018-01-24 MED ORDER — DEXAMETHASONE SODIUM PHOSPHATE 10 MG/ML IJ SOLN
INTRAMUSCULAR | Status: AC
  Filled 2018-01-24: qty 1

## 2018-01-24 MED ORDER — PALONOSETRON HCL INJECTION 0.25 MG/5ML
0.2500 mg | Freq: Once | INTRAVENOUS | Status: AC
  Administered 2018-01-24: 0.25 mg via INTRAVENOUS

## 2018-01-24 NOTE — Progress Notes (Signed)
Hematology and Oncology Follow Up Visit  [name] [id number] [birth year] 67 y.o. 01/24/2018   Principle Diagnosis:  IgG kappa myeloma Anemia secondary to renal insufficiency Intermittent iron - deficiency anemia Hypotestosteronemia  Current Therapy:   Aredia 60 mg IV q 3 months- next dose 03/2018 Aranesp 300 mcg subcu as needed for hemoglobin less than 11 DepoTestosterone400 mg q 4 weeks IV iron as indicated Kyprolis/Cytoxan - s/p cycle13   Interim History:  [name] is here today for follow-up and treatment. The recent rain has exacerbated his arthritis. He has generalized aches and pains in his back and joints. No M-spike detected in November, IgG was 912 mg/dL and kappa light chains 3.09 mg/dL.  BUN is back within normal rand and Creatinine is still elevated at 2.45.  No fever, chills, n/v, cough, rash, dizziness, SOB, chest pain, palpitations, abdominal pain or changes in bowel or bladder habits.  He has GERD with occasional nausea.  He has not noticed any episodes of bleeding, no bruising or petechiae.  No swelling in his extremities at this time. 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  No lymphadenopathy noted on exam.  His appetite comes and goes. He is staying well hydrated. His weight is stable.   ECOG Performance Status: 1 - Symptomatic but completely ambulatory  Medications:  Allergies as of 01/24/2018      Reactions   Iodinated Diagnostic Agents Rash   Patient states he was instructed not to take IV contrast.  In 2008 he had an unknown reaction, and was told not to take it again.  He was also told not to take it due to his kidneys.   Iodine Anxiety, Rash, Other (See Comments)   Didn't feel right "instructed not to take per MD--something with his port" Didn't feel right Didn't feel right "instructed not to take per MD--something with his port"      Medication List        Accurate as of 01/24/18  9:12 AM. Always use your most recent  med list.          amLODipine 10 MG tablet Commonly known as:  NORVASC Take 10 mg by mouth daily.   CARAFATE 1 GM/10ML suspension Generic drug:  sucralfate Take 1 g by mouth 4 (four) times daily.   cefdinir 300 MG capsule Commonly known as:  OMNICEF Take 1 capsule (300 mg total) by mouth 2 (two) times daily.   ciclopirox 8 % solution Commonly known as:  PENLAC APP QD. REMOVE WEEKLY WITH NAIL POLISH REMOVER THEN REAPPLY   CLEAR EYES ALL SEASONS 5-6 MG/ML Soln Generic drug:  Polyvinyl Alcohol-Povidone Place 2 drops into both eyes 3 (three) times daily as needed (drye eyes.).   cyclobenzaprine 10 MG tablet Commonly known as:  FLEXERIL Take 10 mg by mouth 3 (three) times daily.   dicyclomine 10 MG capsule Commonly known as:  BENTYL TAKE ONE CAPSULE BY MOUTH 3 TIMES A DAY   dronabinol 2.5 MG capsule Commonly known as:  MARINOL Take 1 capsule (2.5 mg total) by mouth 2 (two) times daily before a meal.   EMBEDA 30-1.2 MG Cpcr Generic drug:  Morphine-Naltrexone Take 3 (three) times daily by mouth.   EMBEDA 20-0.8 MG Cpcr Generic drug:  Morphine-Naltrexone TAKE 1 CAPSULE BY MOUTH THREE TIMES A DAY   furosemide 40 MG tablet Commonly known as:  LASIX Take 40 mg by mouth.   glipiZIDE 5 MG tablet Commonly known as:  GLUCOTROL Take 5 mg by mouth  2 (two) times daily before a meal.   isosorbide mononitrate 30 MG 24 hr tablet Commonly known as:  IMDUR Take 30 mg by mouth daily.   latanoprost 0.005 % ophthalmic solution Commonly known as:  XALATAN Place 1 drop into the right eye every evening.   lidocaine-prilocaine cream Commonly known as:  EMLA Apply 1 application topically as needed.   linaclotide 145 MCG Caps capsule Commonly known as:  LINZESS Take 1 capsule (145 mcg total) by mouth daily before breakfast.   LORazepam 0.5 MG tablet Commonly known as:  ATIVAN Take 1 tablet (0.5 mg total) by mouth every 6 (six) hours as needed (Nausea or vomiting).   losartan  25 MG tablet Commonly known as:  COZAAR Take 25 mg by mouth.   meclizine 50 MG tablet Commonly known as:  ANTIVERT Take 0.5 tablets (25 mg total) by mouth 4 (four) times daily as needed for dizziness.   methocarbamol 500 MG tablet Commonly known as:  ROBAXIN Take 500 mg by mouth 4 (four) times daily.   metoprolol succinate 100 MG 24 hr tablet Commonly known as:  TOPROL-XL Take by mouth.   mirabegron ER 50 MG Tb24 tablet Commonly known as:  MYRBETRIQ Take 50 mg by mouth daily.   omeprazole 40 MG capsule Commonly known as:  PRILOSEC Take 1 capsule (40 mg total) by mouth 2 (two) times daily.   ondansetron 8 MG tablet Commonly known as:  ZOFRAN TAKE 1 TABLET(8 MG) BY MOUTH TWICE DAILY AS NEEDED FOR NAUSEA OR VOMITING   orphenadrine 100 MG tablet Commonly known as:  NORFLEX Take 1 tablet (100 mg total) by mouth 2 (two) times daily.   oxyCODONE 20 mg 12 hr tablet Commonly known as:  OXYCONTIN Take 1 tablet (20 mg total) by mouth every 12 (twelve) hours.   oxyCODONE-acetaminophen 10-325 MG tablet Commonly known as:  PERCOCET Take 1 tablet by mouth every 6 (six) hours as needed for pain.   potassium chloride SA 20 MEQ tablet Commonly known as:  K-DUR,KLOR-CON Take 1 tablet (20 mEq total) by mouth 2 (two) times daily.   prochlorperazine 10 MG tablet Commonly known as:  COMPAZINE take 1 tablet by mouth every 6 hours if needed for nausea and vomiting   Sennosides 25 MG Tabs Take by mouth.   traMADol 50 MG tablet Commonly known as:  ULTRAM TAKE 1 TABLET(50 MG) BY MOUTH EVERY 6 HOURS AS NEEDED   Vitamin D (Ergocalciferol) 1.25 MG (50000 UT) Caps capsule Commonly known as:  DRISDOL Take 50,000 Units by mouth every Saturday.   ZIOPTAN 0.0015 % Soln Generic drug:  Tafluprost Place 1 drop into both eyes daily.       Allergies:  Allergies  Allergen Reactions  . Iodinated Diagnostic Agents Rash    Patient states he was instructed not to take IV contrast.  In 2008 he  had an unknown reaction, and was told not to take it again.  He was also told not to take it due to his kidneys.  . Iodine Anxiety, Rash and Other (See Comments)    Didn't feel right "instructed not to take per MD--something with his port" Didn't feel right Didn't feel right "instructed not to take per MD--something with his port"    Past Medical History, Surgical history, Social history, and Family History were reviewed and updated.  Review of Systems: All other 10 point review of systems is negative.   Physical Exam:  weight is 201 lb 12.8 oz (91.5 kg). His oral  temperature is 98.3 F (36.8 C). His blood pressure is 146/73 (abnormal) and his pulse is 76. His oxygen saturation is 100%.   Wt Readings from Last 3 Encounters:  01/24/18 201 lb 12.8 oz (91.5 kg)  01/10/18 194 lb (88 kg)  12/13/17 198 lb (89.8 kg)    Ocular: Sclerae unicteric, pupils equal, round and reactive to light Ear-nose-throat: Oropharynx clear, dentition fair Lymphatic: No cervical, supraclavicular or axillary adenopathy Lungs no rales or rhonchi, good excursion bilaterally Heart regular rate and rhythm, no murmur appreciated Abd soft, nontender, positive bowel sounds, no liver or spleen tip palpated on exam, no fluid wave  MSK no focal spinal tenderness, no joint edema Neuro: non-focal, well-oriented, appropriate affect Breasts: Deferred    Lab Results  Component Value Date   WBC 5.5 01/24/2018   HGB 10.4 (L) 01/24/2018   HCT 34.1 (L) 01/24/2018   MCV 83.4 01/24/2018   PLT 175 01/24/2018   Lab Results  Component Value Date   FERRITIN 1,631 (H) 01/10/2018   IRON 75 01/10/2018   TIBC 245 01/10/2018   UIBC 170 01/10/2018   IRONPCTSAT 31 01/10/2018   Lab Results  Component Value Date   RETICCTPCT 3.6 (H) 01/24/2018   RBC 4.09 (L) 01/24/2018   RBC 4.09 (L) 01/24/2018   RETICCTABS 27.5 03/21/2014   Lab Results  Component Value Date   KPAFRELGTCHN 30.9 (H) 01/10/2018   LAMBDASER 20.2  01/10/2018   KAPLAMBRATIO 1.53 01/10/2018   Lab Results  Component Value Date   IGGSERUM 912 01/10/2018   IGA 165 01/10/2018   IGMSERUM 21 01/10/2018   Lab Results  Component Value Date   TOTALPROTELP 6.9 01/10/2018   ALBUMINELP 3.8 01/10/2018   A1GS 0.2 01/10/2018   A2GS 1.1 (H) 01/10/2018   BETS 0.9 01/10/2018   BETA2SER 0.5 11/07/2014   GAMS 0.8 01/10/2018   MSPIKE Not Observed 01/10/2018   SPEI Comment 12/27/2017     Chemistry      Component Value Date/Time   NA 138 01/24/2018 0810   NA 143 02/22/2017 0744   K 4.3 01/24/2018 0810   K 4.0 02/22/2017 0744   CL 101 01/24/2018 0810   CL 106 02/22/2017 0744   CO2 28 01/24/2018 0810   CO2 27 02/22/2017 0744   BUN 20 01/24/2018 0810   BUN 26 (H) 02/22/2017 0744   CREATININE 2.45 (H) 01/24/2018 0810   CREATININE 2.3 (H) 02/22/2017 0744      Component Value Date/Time   CALCIUM 8.4 (L) 01/24/2018 0810   CALCIUM 9.3 02/22/2017 0744   ALKPHOS 57 01/24/2018 0810   ALKPHOS 80 02/22/2017 0744   AST 12 (L) 01/24/2018 0810   ALT 10 01/24/2018 0810   ALT 22 02/22/2017 0744   BILITOT 0.4 01/24/2018 0810       Impression and Plan: [name] is a very pleasant 67 yo African American gentleman with IgG kappa myeloma.  He has responded nicely to treatment. Myeloma studies for today are pending.  We will proceed with treatment today as planned.  Aredia is due again in February.  We will see him again with the start of his next cycle.   He will contact our office with any questions or concerns. We can certainly see him sooner if need be.   Laverna Peace, NP 12/2/20199:12 AM

## 2018-01-24 NOTE — Progress Notes (Signed)
Okay to treat with elevated SCr per Dr. Ennever. °

## 2018-01-24 NOTE — Patient Instructions (Signed)
Implanted Port Home Guide An implanted port is a type of central line that is placed under the skin. Central lines are used to provide IV access when treatment or nutrition needs to be given through a person's veins. Implanted ports are used for long-term IV access. An implanted port may be placed because:  You need IV medicine that would be irritating to the small veins in your hands or arms.  You need long-term IV medicines, such as antibiotics.  You need IV nutrition for a long period.  You need frequent blood draws for lab tests.  You need dialysis.  Implanted ports are usually placed in the chest area, but they can also be placed in the upper arm, the abdomen, or the leg. An implanted port has two main parts:  Reservoir. The reservoir is round and will appear as a small, raised area under your skin. The reservoir is the part where a needle is inserted to give medicines or draw blood.  Catheter. The catheter is a thin, flexible tube that extends from the reservoir. The catheter is placed into a large vein. Medicine that is inserted into the reservoir goes into the catheter and then into the vein.  How will I care for my incision site? Do not get the incision site wet. Bathe or shower as directed by your health care provider. How is my port accessed? Special steps must be taken to access the port:  Before the port is accessed, a numbing cream can be placed on the skin. This helps numb the skin over the port site.  Your health care provider uses a sterile technique to access the port. ? Your health care provider must put on a mask and sterile gloves. ? The skin over your port is cleaned carefully with an antiseptic and allowed to dry. ? The port is gently pinched between sterile gloves, and a needle is inserted into the port.  Only "non-coring" port needles should be used to access the port. Once the port is accessed, a blood return should be checked. This helps ensure that the port  is in the vein and is not clogged.  If your port needs to remain accessed for a constant infusion, a clear (transparent) bandage will be placed over the needle site. The bandage and needle will need to be changed every week, or as directed by your health care provider.  Keep the bandage covering the needle clean and dry. Do not get it wet. Follow your health care provider's instructions on how to take a shower or bath while the port is accessed.  If your port does not need to stay accessed, no bandage is needed over the port.  What is flushing? Flushing helps keep the port from getting clogged. Follow your health care provider's instructions on how and when to flush the port. Ports are usually flushed with saline solution or a medicine called heparin. The need for flushing will depend on how the port is used.  If the port is used for intermittent medicines or blood draws, the port will need to be flushed: ? After medicines have been given. ? After blood has been drawn. ? As part of routine maintenance.  If a constant infusion is running, the port may not need to be flushed.  How long will my port stay implanted? The port can stay in for as long as your health care provider thinks it is needed. When it is time for the port to come out, surgery will be   done to remove it. The procedure is similar to the one performed when the port was put in. When should I seek immediate medical care? When you have an implanted port, you should seek immediate medical care if:  You notice a bad smell coming from the incision site.  You have swelling, redness, or drainage at the incision site.  You have more swelling or pain at the port site or the surrounding area.  You have a fever that is not controlled with medicine.  This information is not intended to replace advice given to you by your health care provider. Make sure you discuss any questions you have with your health care provider. Document  Released: 02/09/2005 Document Revised: 07/18/2015 Document Reviewed: 10/17/2012 Elsevier Interactive Patient Education  2017 [employer]  

## 2018-01-24 NOTE — Patient Instructions (Signed)
[facility] Discharge Instructions for Patients Receiving Chemotherapy  Today you received the following chemotherapy agents Cytoxan, Kyprolis  To help prevent nausea and vomiting after your treatment, we encourage you to take your nausea medication    If you develop nausea and vomiting that is not controlled by your nausea medication, call the clinic.   BELOW ARE SYMPTOMS THAT SHOULD BE REPORTED IMMEDIATELY:  *FEVER GREATER THAN 100.5 F  *CHILLS WITH OR WITHOUT FEVER  NAUSEA AND VOMITING THAT IS NOT CONTROLLED WITH YOUR NAUSEA MEDICATION  *UNUSUAL SHORTNESS OF BREATH  *UNUSUAL BRUISING OR BLEEDING  TENDERNESS IN MOUTH AND THROAT WITH OR WITHOUT PRESENCE OF ULCERS  *URINARY PROBLEMS  *BOWEL PROBLEMS  UNUSUAL RASH Items with * indicate a potential emergency and should be followed up as soon as possible.  Feel free to call the clinic should you have any questions or concerns. The clinic phone number is [phone].  Please show the [facility] at check-in to the Emergency Department and triage nurse.

## 2018-01-25 LAB — KAPPA/LAMBDA LIGHT CHAINS
KAPPA, LAMDA LIGHT CHAIN RATIO: 1.35 (ref 0.26–1.65)
Kappa free light chain: 28 mg/L — ABNORMAL HIGH (ref 3.3–19.4)
LAMDA FREE LIGHT CHAINS: 20.8 mg/L (ref 5.7–26.3)

## 2018-01-25 LAB — IGG, IGA, IGM
IgA: 154 mg/dL (ref 61–437)
IgG (Immunoglobin G), Serum: 879 mg/dL (ref 700–1600)
IgM (Immunoglobulin M), Srm: 18 mg/dL — ABNORMAL LOW (ref 20–172)

## 2018-01-25 LAB — TESTOSTERONE: Testosterone: 1294 ng/dL — ABNORMAL HIGH (ref 264–916)

## 2018-01-26 LAB — PROTEIN ELECTROPHORESIS, SERUM, WITH REFLEX
A/G Ratio: 1.3 (ref 0.7–1.7)
Albumin ELP: 3.5 g/dL (ref 2.9–4.4)
Alpha-1-Globulin: 0.2 g/dL (ref 0.0–0.4)
Alpha-2-Globulin: 1 g/dL (ref 0.4–1.0)
BETA GLOBULIN: 0.8 g/dL (ref 0.7–1.3)
GAMMA GLOBULIN: 0.8 g/dL (ref 0.4–1.8)
GLOBULIN, TOTAL: 2.8 g/dL (ref 2.2–3.9)
M-SPIKE, %: 0.3 g/dL — AB
SPEP Interpretation: 0
Total Protein ELP: 6.3 g/dL (ref 6.0–8.5)

## 2018-01-26 LAB — IMMUNOFIXATION REFLEX, SERUM
IGG (IMMUNOGLOBIN G), SERUM: 893 mg/dL (ref 700–1600)
IgA: 157 mg/dL (ref 61–437)
IgM (Immunoglobulin M), Srm: 18 mg/dL — ABNORMAL LOW (ref 20–172)

## 2018-01-31 ENCOUNTER — Inpatient Hospital Stay: Payer: Medicare Other

## 2018-01-31 ENCOUNTER — Other Ambulatory Visit: Payer: Self-pay

## 2018-01-31 ENCOUNTER — Other Ambulatory Visit: Payer: Self-pay | Admitting: Hematology & Oncology

## 2018-01-31 VITALS — BP 127/63 | HR 118 | Temp 100.1°F | Resp 18

## 2018-01-31 DIAGNOSIS — D631 Anemia in chronic kidney disease: Secondary | ICD-10-CM

## 2018-01-31 DIAGNOSIS — Z5112 Encounter for antineoplastic immunotherapy: Secondary | ICD-10-CM | POA: Diagnosis not present

## 2018-01-31 DIAGNOSIS — C9000 Multiple myeloma not having achieved remission: Secondary | ICD-10-CM

## 2018-01-31 DIAGNOSIS — N189 Chronic kidney disease, unspecified: Principal | ICD-10-CM

## 2018-01-31 LAB — CBC WITH DIFFERENTIAL (CANCER CENTER ONLY)
Abs Immature Granulocytes: 1.16 10*3/uL — ABNORMAL HIGH (ref 0.00–0.07)
BASOS ABS: 0 10*3/uL (ref 0.0–0.1)
Basophils Relative: 0 %
EOS PCT: 0 %
Eosinophils Absolute: 0 10*3/uL (ref 0.0–0.5)
HCT: 34.4 % — ABNORMAL LOW (ref 39.0–52.0)
HEMOGLOBIN: 10.5 g/dL — AB (ref 13.0–17.0)
Immature Granulocytes: 7 %
Lymphocytes Relative: 3 %
Lymphs Abs: 0.6 10*3/uL — ABNORMAL LOW (ref 0.7–4.0)
MCH: 24.9 pg — AB (ref 26.0–34.0)
MCHC: 30.5 g/dL (ref 30.0–36.0)
MCV: 81.7 fL (ref 80.0–100.0)
Monocytes Absolute: 1.6 10*3/uL — ABNORMAL HIGH (ref 0.1–1.0)
Monocytes Relative: 10 %
Neutro Abs: 13.2 10*3/uL — ABNORMAL HIGH (ref 1.7–7.7)
Neutrophils Relative %: 80 %
PLATELETS: 118 10*3/uL — AB (ref 150–400)
RBC: 4.21 MIL/uL — ABNORMAL LOW (ref 4.22–5.81)
RDW: 18.3 % — ABNORMAL HIGH (ref 11.5–15.5)
WBC Count: 16.5 10*3/uL — ABNORMAL HIGH (ref 4.0–10.5)
nRBC: 0 % (ref 0.0–0.2)

## 2018-01-31 LAB — CMP (CANCER CENTER ONLY)
ALT: 8 U/L (ref 0–44)
ANION GAP: 11 (ref 5–15)
AST: 12 U/L — ABNORMAL LOW (ref 15–41)
Albumin: 3.9 g/dL (ref 3.5–5.0)
Alkaline Phosphatase: 62 U/L (ref 38–126)
BUN: 28 mg/dL — ABNORMAL HIGH (ref 8–23)
CO2: 25 mmol/L (ref 22–32)
Calcium: 8.8 mg/dL — ABNORMAL LOW (ref 8.9–10.3)
Chloride: 102 mmol/L (ref 98–111)
Creatinine: 2.98 mg/dL — ABNORMAL HIGH (ref 0.61–1.24)
GFR, EST AFRICAN AMERICAN: 24 mL/min — AB (ref >60)
GFR, Estimated: 21 mL/min — ABNORMAL LOW (ref >60)
Glucose, Bld: 158 mg/dL — ABNORMAL HIGH (ref 70–99)
Potassium: 4.2 mmol/L (ref 3.5–5.1)
Sodium: 138 mmol/L (ref 135–145)
Total Bilirubin: 0.9 mg/dL (ref 0.3–1.2)
Total Protein: 6.8 g/dL (ref 6.5–8.1)

## 2018-01-31 MED ORDER — CEFDINIR 300 MG PO CAPS: 300.0000 mg | ORAL_CAPSULE | Freq: Two times a day (BID) | ORAL | 0 refills | Status: DC

## 2018-01-31 MED ORDER — HEPARIN SOD (PORK) LOCK FLUSH 100 UNIT/ML IV SOLN
500.0000 [IU] | Freq: Once | INTRAVENOUS | Status: AC
  Administered 2018-01-31: 500 [IU] via INTRAVENOUS
  Filled 2018-01-31: qty 5

## 2018-01-31 MED ORDER — SODIUM CHLORIDE 0.9% FLUSH
10.0000 mL | Freq: Once | INTRAVENOUS | Status: AC
  Administered 2018-01-31: 10 mL
  Filled 2018-01-31: qty 10

## 2018-01-31 MED ORDER — SODIUM CHLORIDE 0.9% FLUSH
10.0000 mL | INTRAVENOUS | Status: DC | PRN
  Administered 2018-01-31: 10 mL via INTRAVENOUS
  Filled 2018-01-31: qty 10

## 2018-01-31 NOTE — Progress Notes (Signed)
Patient complains of nausea and vomiting since Friday. Patient complains of earache since Friday as well..Patient complains of increased fatigue. Temperature noted to be 100.1 orally today.  Hold treatment today. Patient to be discharged per Dr. [name].  Patient verbalized understanding

## 2018-01-31 NOTE — Patient Instructions (Signed)
Implanted Port Home Guide An implanted port is a type of central line that is placed under the skin. Central lines are used to provide IV access when treatment or nutrition needs to be given through a person's veins. Implanted ports are used for long-term IV access. An implanted port may be placed because:  You need IV medicine that would be irritating to the small veins in your hands or arms.  You need long-term IV medicines, such as antibiotics.  You need IV nutrition for a long period.  You need frequent blood draws for lab tests.  You need dialysis.  Implanted ports are usually placed in the chest area, but they can also be placed in the upper arm, the abdomen, or the leg. An implanted port has two main parts:  Reservoir. The reservoir is round and will appear as a small, raised area under your skin. The reservoir is the part where a needle is inserted to give medicines or draw blood.  Catheter. The catheter is a thin, flexible tube that extends from the reservoir. The catheter is placed into a large vein. Medicine that is inserted into the reservoir goes into the catheter and then into the vein.  How will I care for my incision site? Do not get the incision site wet. Bathe or shower as directed by your health care provider. How is my port accessed? Special steps must be taken to access the port:  Before the port is accessed, a numbing cream can be placed on the skin. This helps numb the skin over the port site.  Your health care provider uses a sterile technique to access the port. ? Your health care provider must put on a mask and sterile gloves. ? The skin over your port is cleaned carefully with an antiseptic and allowed to dry. ? The port is gently pinched between sterile gloves, and a needle is inserted into the port.  Only "non-coring" port needles should be used to access the port. Once the port is accessed, a blood return should be checked. This helps ensure that the port  is in the vein and is not clogged.  If your port needs to remain accessed for a constant infusion, a clear (transparent) bandage will be placed over the needle site. The bandage and needle will need to be changed every week, or as directed by your health care provider.  Keep the bandage covering the needle clean and dry. Do not get it wet. Follow your health care provider's instructions on how to take a shower or bath while the port is accessed.  If your port does not need to stay accessed, no bandage is needed over the port.  What is flushing? Flushing helps keep the port from getting clogged. Follow your health care provider's instructions on how and when to flush the port. Ports are usually flushed with saline solution or a medicine called heparin. The need for flushing will depend on how the port is used.  If the port is used for intermittent medicines or blood draws, the port will need to be flushed: ? After medicines have been given. ? After blood has been drawn. ? As part of routine maintenance.  If a constant infusion is running, the port may not need to be flushed.  How long will my port stay implanted? The port can stay in for as long as your health care provider thinks it is needed. When it is time for the port to come out, surgery will be   done to remove it. The procedure is similar to the one performed when the port was put in. When should I seek immediate medical care? When you have an implanted port, you should seek immediate medical care if:  You notice a bad smell coming from the incision site.  You have swelling, redness, or drainage at the incision site.  You have more swelling or pain at the port site or the surrounding area.  You have a fever that is not controlled with medicine.  This information is not intended to replace advice given to you by your health care provider. Make sure you discuss any questions you have with your health care provider. Document  Released: 02/09/2005 Document Revised: 07/18/2015 Document Reviewed: 10/17/2012 Elsevier Interactive Patient Education  2017 [employer]  

## 2018-02-03 ENCOUNTER — Other Ambulatory Visit: Payer: Self-pay

## 2018-02-03 ENCOUNTER — Emergency Department (HOSPITAL_COMMUNITY): Payer: Medicare Other

## 2018-02-03 ENCOUNTER — Encounter (HOSPITAL_COMMUNITY): Payer: Self-pay

## 2018-02-03 ENCOUNTER — Emergency Department (HOSPITAL_COMMUNITY)
Admission: EM | Admit: 2018-02-03 | Discharge: 2018-02-03 | Disposition: A | Discharge status: Home / Self Care | Payer: Medicare Other | Attending: Emergency Medicine | Admitting: Emergency Medicine

## 2018-02-03 DIAGNOSIS — I10 Essential (primary) hypertension: Secondary | ICD-10-CM | POA: Insufficient documentation

## 2018-02-03 DIAGNOSIS — E119 Type 2 diabetes mellitus without complications: Secondary | ICD-10-CM | POA: Diagnosis not present

## 2018-02-03 DIAGNOSIS — N451 Epididymitis: Secondary | ICD-10-CM | POA: Insufficient documentation

## 2018-02-03 DIAGNOSIS — Z79899 Other long term (current) drug therapy: Secondary | ICD-10-CM | POA: Diagnosis not present

## 2018-02-03 DIAGNOSIS — N50812 Left testicular pain: Secondary | ICD-10-CM

## 2018-02-03 LAB — BASIC METABOLIC PANEL
Anion gap: 12 (ref 5–15)
BUN: 21 mg/dL (ref 8–23)
CO2: 23 mmol/L (ref 22–32)
Calcium: 9.2 mg/dL (ref 8.9–10.3)
Chloride: 102 mmol/L (ref 98–111)
Creatinine, Ser: 2.79 mg/dL — ABNORMAL HIGH (ref 0.61–1.24)
GFR calc Af Amer: 26 mL/min — ABNORMAL LOW (ref >60)
GFR calc non Af Amer: 22 mL/min — ABNORMAL LOW (ref >60)
Glucose, Bld: 99 mg/dL (ref 70–99)
Potassium: 3.7 mmol/L (ref 3.5–5.1)
SODIUM: 137 mmol/L (ref 135–145)

## 2018-02-03 LAB — CBC WITH DIFFERENTIAL/PLATELET
Abs Immature Granulocytes: 0.13 10*3/uL — ABNORMAL HIGH (ref 0.00–0.07)
BASOS PCT: 0 %
Basophils Absolute: 0 10*3/uL (ref 0.0–0.1)
EOS PCT: 1 %
Eosinophils Absolute: 0.1 10*3/uL (ref 0.0–0.5)
HCT: 33.5 % — ABNORMAL LOW (ref 39.0–52.0)
Hemoglobin: 10.2 g/dL — ABNORMAL LOW (ref 13.0–17.0)
Immature Granulocytes: 1 %
Lymphocytes Relative: 4 %
Lymphs Abs: 0.5 10*3/uL — ABNORMAL LOW (ref 0.7–4.0)
MCH: 24.7 pg — ABNORMAL LOW (ref 26.0–34.0)
MCHC: 30.4 g/dL (ref 30.0–36.0)
MCV: 81.1 fL (ref 80.0–100.0)
Monocytes Absolute: 1.4 10*3/uL — ABNORMAL HIGH (ref 0.1–1.0)
Monocytes Relative: 11 %
Neutro Abs: 11.1 10*3/uL — ABNORMAL HIGH (ref 1.7–7.7)
Neutrophils Relative %: 83 %
Platelets: 143 10*3/uL — ABNORMAL LOW (ref 150–400)
RBC: 4.13 MIL/uL — ABNORMAL LOW (ref 4.22–5.81)
RDW: 18.3 % — AB (ref 11.5–15.5)
WBC: 13.3 10*3/uL — ABNORMAL HIGH (ref 4.0–10.5)
nRBC: 0 % (ref 0.0–0.2)

## 2018-02-03 MED ORDER — HYDROCODONE-ACETAMINOPHEN 5-325 MG PO TABS: 1.0000 | ORAL_TABLET | Freq: Four times a day (QID) | ORAL | 0 refills | Status: DC | PRN

## 2018-02-03 MED ORDER — HEPARIN SOD (PORK) LOCK FLUSH 100 UNIT/ML IV SOLN
500.0000 [IU] | Freq: Once | INTRAVENOUS | Status: AC
  Administered 2018-02-03: 500 [IU]
  Filled 2018-02-03: qty 5

## 2018-02-03 MED ORDER — CIPROFLOXACIN HCL 500 MG PO TABS: 500.0000 mg | ORAL_TABLET | Freq: Two times a day (BID) | ORAL | 0 refills | Status: DC

## 2018-02-03 NOTE — ED Triage Notes (Signed)
Patient states he was running a fever and did not receive his chemo treatment 4 days ago. Patient states he then went to his urologist and had a procedure done. Patient states he has been having left groin pain and swelling since.

## 2018-02-03 NOTE — ED Provider Notes (Signed)
[facility] DEPT Provider Note   CSN: [id number] Arrival date & time: 02/03/18  1128     History   Chief Complaint Chief Complaint  Patient presents with  . Cancer patient  . Groin Pain    HPI [name] is a 67 y.o. male.  Patient is a 67 year old male with history of multiple myeloma, diabetes, chronic renal insufficiency.  He presents today for evaluation of left testicle swelling.  He tells me he underwent a cystoscopy by his urologist in [facility] 5 days ago.  For the past 3 days he has noticed increased pain and swelling to the left testicle.  He denies any dysuria, hematuria, or fever.  The history is provided by the patient.  Groin Pain  This is a new problem. Episode onset: 3 days ago. The problem occurs constantly. The problem has been rapidly worsening. Pertinent negatives include no abdominal pain. Nothing aggravates the symptoms. Nothing relieves the symptoms. He has tried nothing for the symptoms.    Past Medical History:  Diagnosis Date  . Anemia   . 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 Anemia, iron deficiency 03/12/2011  . Anxiety   . Anxiety disorder   . Arthralgia of multiple joints 12/31/2010  . Arthritis   . Constipation   . Diabetes mellitus without complication ([place])   . Diabetic neuropathy ([place])   . Diverticulosis   . Fibromyalgia   . GERD (gastroesophageal reflux disease)   . Headache   . Hemorrhoids   . Herpes zoster   . Hyperlipidemia   . Hypertension   . Hypotestosteronism 03/17/2012  . Multiple myeloma   . Multiple myeloma ([place])   . Myeloma ([place]) 12/31/2010  . Pneumonia   . Renal insufficiency   . Type 2 diabetes mellitus ([place])   . Urinary retention     Patient Active Problem List   Diagnosis Date Noted  . Cervical spondylosis with myelopathy 06/05/2016  . Anemia of chronic renal failure 12/17/2015  . Preventive measure 12/17/2015  . Type 2 diabetes mellitus ([place]) 06/05/2015  . Edema extremities  06/05/2015  . Benign hypertension 06/05/2015  . Abdominal pain in male   . Right sided abdominal pain   . Lower abdominal pain   . Multiple myeloma ([place])   . Acute abdominal pain 11/14/2014  . Generalized abdominal pain   . Esophageal dysphagia   . Hypertensive lower esophageal sphincter   . Dysphagia 09/26/2014  . Rectal bleeding 09/26/2014  . Nausea with vomiting 09/26/2014  . Constipation 09/26/2014  . Hypotestosteronism 03/17/2012  . Can't get food down 01/17/2012  . Hematochezia 07/12/2011  . Anemia, iron deficiency 03/12/2011  . 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 Myeloma ([place]) 12/31/2010  . Diabetes mellitus, type II ([place]) 12/31/2010  . Arthralgia of multiple joints 12/31/2010  . GERD 03/09/2008  . DIVERTICULOSIS-COLON 03/09/2008  . Other dysphagia 03/09/2008    Past Surgical History:  Procedure Laterality Date  . CATARACT EXTRACTION     bi-lateral  . COLONOSCOPY    . COLONOSCOPY  07/13/2011   Procedure: COLONOSCOPY;  Surgeon: Ladene Artist, MD,FACG;  Location: [facility];  Service: Endoscopy;  Laterality: N/A;  . ESOPHAGOGASTRODUODENOSCOPY    . IR CHEST FLUORO  09/22/2016  . IR FLUORO GUIDE PORT INSERTION RIGHT  09/23/2016  . IR REMOVAL TUN ACCESS W/ PORT W/O FL MOD SED  09/23/2016  . IR US GUIDE VASC ACCESS LEFT  09/23/2016  . KNEE ARTHROSCOPY     left  .  KNEE SURGERY    . PORTACATH PLACEMENT    . POSTERIOR CERVICAL FUSION/FORAMINOTOMY N/A 06/05/2016   Procedure: LAMINECTOMY CERVICAL 3 - CERVICAL 6 WITH LATERAL MASS FUSION;  Surgeon: Consuella Lose, MD;  Location: [facility];  Service: Neurosurgery;  Laterality: N/A;  LAMINECTOMY CERVICAL 3 - CERVICAL 6 WITH LATERAL MASS FUSION  . TONSILLECTOMY    . TRANSURETHRAL RESECTION OF PROSTATE  01/2011   Dr. at [facility] in [place]        Home Medications    Prior to Admission medications   Medication Sig Start Date End Date Taking? Authorizing Provider  amLODipine (NORVASC) 10 MG tablet Take 10 mg by mouth  daily.   Yes [provider]  CARAFATE 1 GM/10ML suspension Take 1 g by mouth 4 (four) times daily.  07/28/11  Yes [provider]  cefdinir (OMNICEF) 300 MG capsule Take 1 capsule (300 mg total) by mouth 2 (two) times daily. 01/31/18  Yes Ennever, Rudell Cobb, MD  cyclobenzaprine (FLEXERIL) 10 MG tablet Take 10 mg by mouth 3 (three) times daily. 12/06/17  Yes [provider]  dicyclomine (BENTYL) 10 MG capsule TAKE ONE CAPSULE BY MOUTH 3 TIMES A DAY 12/22/17  Yes [place], Holli Humbles, NP  dronabinol (MARINOL) 2.5 MG capsule Take 1 capsule (2.5 mg total) by mouth 2 (two) times daily before a meal. 09/15/17  Yes [place], Holli Humbles, NP  EMBEDA 20-0.8 MG CPCR Take 1 capsule by mouth 3 (three) times daily.  11/09/17  Yes [provider]  furosemide (LASIX) 40 MG tablet Take 40 mg by mouth. 07/01/16  Yes [provider]  glipiZIDE (GLUCOTROL) 5 MG tablet Take 5 mg by mouth 2 (two) times daily before a meal.    Yes [provider]  latanoprost (XALATAN) 0.005 % ophthalmic solution Place 1 drop into the right eye every evening.    Yes [provider]  lidocaine-prilocaine (EMLA) cream Apply 1 application topically as needed. Patient taking differently: Apply 1 application topically as needed (local anesthesia).  10/05/16  Yes Ennever, Rudell Cobb, MD  Linaclotide [facility]) 145 MCG CAPS capsule Take 1 capsule (145 mcg total) by mouth daily before breakfast. Patient taking differently: Take 145 mcg by mouth daily as needed (constipation).  11/16/14  Yes Zehr, Laban Emperor, PA-C  LORazepam (ATIVAN) 0.5 MG tablet Take 1 tablet (0.5 mg total) by mouth every 6 (six) hours as needed (Nausea or vomiting). 08/23/17  Yes [place], Holli Humbles, NP  losartan (COZAAR) 25 MG tablet Take 25 mg by mouth. 07/01/16  Yes [provider]  metoprolol succinate (TOPROL-XL) 100 MG 24 hr tablet Take 100 mg by mouth daily.  12/14/17  Yes [provider]  mirabegron ER  (MYRBETRIQ) 50 MG TB24 tablet Take 50 mg by mouth daily.   Yes [provider]  Morphine-Naltrexone (EMBEDA) 30-1.2 MG CPCR Take 3 (three) times daily by mouth.   Yes [provider]  omeprazole (PRILOSEC) 40 MG capsule Take 1 capsule (40 mg total) by mouth 2 (two) times daily. Patient taking differently: Take 40 mg by mouth daily.  11/29/17  Yes [place], Holli Humbles, NP  ondansetron (ZOFRAN) 8 MG tablet TAKE 1 TABLET(8 MG) BY MOUTH TWICE DAILY AS NEEDED FOR NAUSEA OR VOMITING 08/20/17  Yes Ennever, Rudell Cobb, MD  orphenadrine (NORFLEX) 100 MG tablet Take 1 tablet (100 mg total) by mouth 2 (two) times daily. 05/22/16  Yes Charlesetta Shanks, MD  oxyCODONE-acetaminophen (PERCOCET) 10-325 MG tablet Take 1 tablet by mouth  every 6 (six) hours as needed for pain. 03/23/16  Yes Ennever, Rudell Cobb, MD  Polyvinyl Alcohol-Povidone (CLEAR EYES ALL SEASONS) 5-6 MG/ML SOLN Place 2 drops into both eyes 3 (three) times daily as needed (drye eyes.).    Yes [provider]  potassium chloride SA (K-DUR,KLOR-CON) 20 MEQ tablet Take 1 tablet (20 mEq total) by mouth 2 (two) times daily. 11/07/14  Yes Volanda Napoleon, MD  prochlorperazine (COMPAZINE) 10 MG tablet take 1 tablet by mouth every 6 hours if needed for nausea and vomiting 05/10/17  Yes [place], Holli Humbles, NP  Sennosides 25 MG TABS Take 25 mg by mouth daily as needed (constipation).    Yes [provider]  Tafluprost, PF, (ZIOPTAN) 0.0015 % SOLN Place 1 drop into both eyes daily.    Yes [provider]  Vitamin D, Ergocalciferol, (DRISDOL) 50000 UNITS CAPS capsule Take 50,000 Units by mouth every Saturday.    Yes [provider]  potassium chloride (KLOR-CON) 20 MEQ packet Take 20 mEq by mouth daily.   05/17/11  [provider]    Family History Family History  Problem Relation Age of Onset  . Breast cancer Sister   . Breast cancer Mother   . Heart disease Mother   . Diabetes Sister   . Bone cancer  Father   . Heart disease Sister   . Heart disease Daughter   . Throat cancer Brother   . Stomach cancer Brother   . Colon cancer Neg Hx     Social History Social History   Tobacco Use  . Smoking status: Never Smoker  . Smokeless tobacco: Never Used  . Tobacco comment: never used tobacco  Substance Use Topics  . Alcohol use: No    Alcohol/week: 0.0 standard drinks  . Drug use: No     Allergies   Iodinated diagnostic agents and Iodine   Review of Systems Review of Systems  Gastrointestinal: Negative for abdominal pain.  All other systems reviewed and are negative.    Physical Exam Updated Vital Signs BP 121/84 (BP Location: Left Arm)   Pulse (!) 124   Temp 99.1 F (37.3 C) (Oral)   Resp 18   Ht '5\' 11"'  (1.803 m)   Wt 89.8 kg   SpO2 99%   BMI 27.62 kg/m   Physical Exam Vitals signs and nursing note reviewed.  Constitutional:      General: He is not in acute distress.    Appearance: Normal appearance. He is normal weight. He is not ill-appearing or diaphoretic.  HENT:     Head: Normocephalic and atraumatic.     Mouth/Throat:     Mouth: Mucous membranes are moist.  Cardiovascular:     Rate and Rhythm: Normal rate and regular rhythm.     Heart sounds: No murmur.  Pulmonary:     Effort: Pulmonary effort is normal. No respiratory distress.     Breath sounds: Normal breath sounds.  Genitourinary:    Penis: Normal.      Comments: Left testicle is swollen, tender.  There is no erythema or fluctuance of the scrotum. Skin:    General: Skin is warm and dry.  Neurological:     Mental Status: He is alert.      ED Treatments / Results  Labs (all labs ordered are listed, but only abnormal results are displayed) Labs Reviewed  BASIC METABOLIC PANEL - Abnormal; Notable for the following components:      Result Value   Creatinine, Ser  2.79 (*)    GFR calc non Af Amer 22 (*)    GFR calc Af Amer 26 (*)    All other components within normal limits  CBC WITH  DIFFERENTIAL/PLATELET - Abnormal; Notable for the following components:   WBC 13.3 (*)    RBC 4.13 (*)    Hemoglobin 10.2 (*)    HCT 33.5 (*)    MCH 24.7 (*)    RDW 18.3 (*)    Platelets 143 (*)    Neutro Abs 11.1 (*)    Lymphs Abs 0.5 (*)    Monocytes Absolute 1.4 (*)    Abs Immature Granulocytes 0.13 (*)    All other components within normal limits  URINALYSIS, ROUTINE W REFLEX MICROSCOPIC    EKG None  Radiology US Scrotum  Result Date: 02/03/2018 CLINICAL DATA:  Left testicular pain for 4-5 days, no injury EXAM: SCROTAL ULTRASOUND DOPPLER ULTRASOUND OF THE TESTICLES TECHNIQUE: Complete ultrasound examination of the testicles, epididymis, and other scrotal structures was performed. Color and spectral Doppler ultrasound were also utilized to evaluate blood flow to the testicles. COMPARISON:  None. FINDINGS: Right testicle Measurements: The right testicle measures 2.7 x 1.6 x 1.8 cm. No intratesticular abnormality is seen. Blood flow is demonstrated to the right testicle with arterial and venous waveforms. Left testicle Measurements: The left testicle measures 2.3 x 1.9 x 2.1 cm. No intratesticular abnormality is noted. Blood flow is demonstrated to the left testicle with arterial and venous waveforms. Right epididymis:  The right epididymis is unremarkable. Left epididymis: There is prominence of the left epididymis which appears hypervascular in the body and tail, consistent with left epididymitis. Hydrocele:  A small amount of fluid is noted bilaterally. Varicocele:  No varicocele is seen. Pulsed Doppler interrogation of both testes demonstrates normal low resistance arterial and venous waveforms bilaterally. IMPRESSION: 1. Prominent and hypervascular left epididymis consistent with left epididymitis. 2. No intratesticular abnormality is seen. Blood flow is demonstrated to both testicles with arterial and venous waveforms. Electronically Signed   By: Ivar Drape M.D.   On: 02/03/2018 13:28     US Scrotum Doppler  Result Date: 02/03/2018 CLINICAL DATA:  Left testicular pain for 4-5 days, no injury EXAM: SCROTAL ULTRASOUND DOPPLER ULTRASOUND OF THE TESTICLES TECHNIQUE: Complete ultrasound examination of the testicles, epididymis, and other scrotal structures was performed. Color and spectral Doppler ultrasound were also utilized to evaluate blood flow to the testicles. COMPARISON:  None. FINDINGS: Right testicle Measurements: The right testicle measures 2.7 x 1.6 x 1.8 cm. No intratesticular abnormality is seen. Blood flow is demonstrated to the right testicle with arterial and venous waveforms. Left testicle Measurements: The left testicle measures 2.3 x 1.9 x 2.1 cm. No intratesticular abnormality is noted. Blood flow is demonstrated to the left testicle with arterial and venous waveforms. Right epididymis:  The right epididymis is unremarkable. Left epididymis: There is prominence of the left epididymis which appears hypervascular in the body and tail, consistent with left epididymitis. Hydrocele:  A small amount of fluid is noted bilaterally. Varicocele:  No varicocele is seen. Pulsed Doppler interrogation of both testes demonstrates normal low resistance arterial and venous waveforms bilaterally. IMPRESSION: 1. Prominent and hypervascular left epididymis consistent with left epididymitis. 2. No intratesticular abnormality is seen. Blood flow is demonstrated to both testicles with arterial and venous waveforms. Electronically Signed   By: Ivar Drape M.D.   On: 02/03/2018 13:28    Procedures Procedures (including critical care time)  Medications Ordered in ED Medications -  No data to display   Initial Impression / Assessment and Plan / ED Course  I have reviewed the triage vital signs and the nursing notes.  Pertinent labs & imaging results that were available during my care of the patient were reviewed by me and considered in my medical decision making (see chart for  details).  Ultrasound confirms epididymitis.  There is good flow through both testicles.  Patient will be treated with Cipro, pain medicine, and follow-up with his urologist on Tuesday as scheduled.  Final Clinical Impressions(s) / ED Diagnoses   Final diagnoses:  Pain in left testicle    ED Discharge Orders    None       Veryl Speak, MD 02/03/18 1418

## 2018-02-03 NOTE — Discharge Instructions (Signed)
Cipro as prescribed.  Hydrocodone as prescribed as needed for pain.  Follow-up with your urologist on Tuesday as scheduled, and return to the ER if symptoms significantly worsen or change.

## 2018-02-07 ENCOUNTER — Inpatient Hospital Stay: Payer: Medicare Other

## 2018-02-07 DIAGNOSIS — C9000 Multiple myeloma not having achieved remission: Secondary | ICD-10-CM

## 2018-02-07 DIAGNOSIS — D631 Anemia in chronic kidney disease: Secondary | ICD-10-CM

## 2018-02-07 DIAGNOSIS — N189 Chronic kidney disease, unspecified: Principal | ICD-10-CM

## 2018-02-07 DIAGNOSIS — E349 Endocrine disorder, unspecified: Secondary | ICD-10-CM

## 2018-02-07 DIAGNOSIS — Z5112 Encounter for antineoplastic immunotherapy: Secondary | ICD-10-CM | POA: Diagnosis not present

## 2018-02-07 DIAGNOSIS — Z299 Encounter for prophylactic measures, unspecified: Secondary | ICD-10-CM

## 2018-02-07 LAB — CMP (CANCER CENTER ONLY)
ALT: 11 U/L (ref 0–44)
AST: 15 U/L (ref 15–41)
Albumin: 3.9 g/dL (ref 3.5–5.0)
Alkaline Phosphatase: 47 U/L (ref 38–126)
Anion gap: 10 (ref 5–15)
BUN: 24 mg/dL — ABNORMAL HIGH (ref 8–23)
CHLORIDE: 103 mmol/L (ref 98–111)
CO2: 25 mmol/L (ref 22–32)
Calcium: 9.1 mg/dL (ref 8.9–10.3)
Creatinine: 2.59 mg/dL — ABNORMAL HIGH (ref 0.61–1.24)
GFR, Est AFR Am: 28 mL/min — ABNORMAL LOW (ref >60)
GFR, Estimated: 24 mL/min — ABNORMAL LOW (ref >60)
Glucose, Bld: 128 mg/dL — ABNORMAL HIGH (ref 70–99)
Potassium: 4.4 mmol/L (ref 3.5–5.1)
Sodium: 138 mmol/L (ref 135–145)
Total Bilirubin: 0.3 mg/dL (ref 0.3–1.2)
Total Protein: 6.9 g/dL (ref 6.5–8.1)

## 2018-02-07 LAB — CBC WITH DIFFERENTIAL (CANCER CENTER ONLY)
Abs Immature Granulocytes: 0.04 10*3/uL (ref 0.00–0.07)
Basophils Absolute: 0.1 10*3/uL (ref 0.0–0.1)
Basophils Relative: 1 %
Eosinophils Absolute: 0.2 10*3/uL (ref 0.0–0.5)
Eosinophils Relative: 3 %
HCT: 32.6 % — ABNORMAL LOW (ref 39.0–52.0)
Hemoglobin: 9.8 g/dL — ABNORMAL LOW (ref 13.0–17.0)
Immature Granulocytes: 1 %
Lymphocytes Relative: 14 %
Lymphs Abs: 0.8 10*3/uL (ref 0.7–4.0)
MCH: 24.6 pg — ABNORMAL LOW (ref 26.0–34.0)
MCHC: 30.1 g/dL (ref 30.0–36.0)
MCV: 81.7 fL (ref 80.0–100.0)
MONOS PCT: 7 %
Monocytes Absolute: 0.4 10*3/uL (ref 0.1–1.0)
Neutro Abs: 4 10*3/uL (ref 1.7–7.7)
Neutrophils Relative %: 74 %
Platelet Count: 285 10*3/uL (ref 150–400)
RBC: 3.99 MIL/uL — ABNORMAL LOW (ref 4.22–5.81)
RDW: 18 % — ABNORMAL HIGH (ref 11.5–15.5)
WBC Count: 5.4 10*3/uL (ref 4.0–10.5)
nRBC: 0 % (ref 0.0–0.2)

## 2018-02-07 MED ORDER — SODIUM CHLORIDE 0.9 % IV SOLN
Freq: Once | INTRAVENOUS | Status: AC
  Administered 2018-02-07: 10:00:00 via INTRAVENOUS
  Filled 2018-02-07: qty 250

## 2018-02-07 MED ORDER — HYDROMORPHONE HCL 1 MG/ML IJ SOLN
INTRAMUSCULAR | Status: AC
  Filled 2018-02-07: qty 2

## 2018-02-07 MED ORDER — SODIUM CHLORIDE 0.9% FLUSH
10.0000 mL | INTRAVENOUS | Status: DC | PRN
  Administered 2018-02-07: 10 mL
  Filled 2018-02-07: qty 10

## 2018-02-07 MED ORDER — HYDROMORPHONE HCL 1 MG/ML IJ SOLN
2.0000 mg | Freq: Once | INTRAMUSCULAR | Status: AC
  Administered 2018-02-07: 2 mg via INTRAVENOUS

## 2018-02-07 MED ORDER — HEPARIN SOD (PORK) LOCK FLUSH 100 UNIT/ML IV SOLN
500.0000 [IU] | Freq: Once | INTRAVENOUS | Status: AC | PRN
  Administered 2018-02-07: 500 [IU]
  Filled 2018-02-07: qty 5

## 2018-02-07 MED ORDER — DEXAMETHASONE SODIUM PHOSPHATE 10 MG/ML IJ SOLN
INTRAMUSCULAR | Status: AC
  Filled 2018-02-07: qty 1

## 2018-02-07 MED ORDER — PALONOSETRON HCL INJECTION 0.25 MG/5ML
0.2500 mg | Freq: Once | INTRAVENOUS | Status: AC
  Administered 2018-02-07: 0.25 mg via INTRAVENOUS

## 2018-02-07 MED ORDER — PALONOSETRON HCL INJECTION 0.25 MG/5ML
INTRAVENOUS | Status: AC
  Filled 2018-02-07: qty 5

## 2018-02-07 MED ORDER — SODIUM CHLORIDE 0.9 % IV SOLN
300.0000 mg/m2 | Freq: Once | INTRAVENOUS | Status: AC
  Administered 2018-02-07: 620 mg via INTRAVENOUS
  Filled 2018-02-07: qty 31

## 2018-02-07 MED ORDER — DEXTROSE 5 % IV SOLN
29.0000 mg/m2 | Freq: Once | INTRAVENOUS | Status: AC
  Administered 2018-02-07: 60 mg via INTRAVENOUS
  Filled 2018-02-07: qty 30

## 2018-02-07 MED ORDER — DEXAMETHASONE SODIUM PHOSPHATE 10 MG/ML IJ SOLN
10.0000 mg | Freq: Once | INTRAMUSCULAR | Status: AC
  Administered 2018-02-07: 10 mg via INTRAVENOUS

## 2018-02-07 NOTE — Patient Instructions (Signed)
Implanted Port Insertion, Care After °This sheet gives you information about how to care for yourself after your procedure. Your health care provider may also give you more specific instructions. If you have problems or questions, contact your health care provider. °What can I expect after the procedure? °After your procedure, it is common to have: °· Discomfort at the port insertion site. °· Bruising on the skin over the port. This should improve over 3-4 days. ° °Follow these instructions at home: °Port care °· After your port is placed, you will get a manufacturer's information card. The card has information about your port. Keep this card with you at all times. °· Take care of the port as told by your health care provider. Ask your health care provider if you or a family member can get training for taking care of the port at home. A home health care nurse may also take care of the port. °· Make sure to remember what type of port you have. °Incision care °· Follow instructions from your health care provider about how to take care of your port insertion site. Make sure you: °? Wash your hands with soap and water before you change your bandage (dressing). If soap and water are not available, use hand sanitizer. °? Change your dressing as told by your health care provider. °? Leave stitches (sutures), skin glue, or adhesive strips in place. These skin closures may need to stay in place for 2 weeks or longer. If adhesive strip edges start to loosen and curl up, you may trim the loose edges. Do not remove adhesive strips completely unless your health care provider tells you to do that. °· Check your port insertion site every day for signs of infection. Check for: °? More redness, swelling, or pain. °? More fluid or blood. °? Warmth. °? Pus or a bad smell. °General instructions °· Do not take baths, swim, or use a hot tub until your health care provider approves. °· Do not lift anything that is heavier than 10 lb (4.5  kg) for a week, or as told by your health care provider. °· Ask your health care provider when it is okay to: °? Return to work or school. °? Resume usual physical activities or sports. °· Do not drive for 24 hours if you were given a medicine to help you relax (sedative). °· Take over-the-counter and prescription medicines only as told by your health care provider. °· Wear a medical alert bracelet in case of an emergency. This will tell any health care providers that you have a port. °· Keep all follow-up visits as told by your health care provider. This is important. °Contact a health care provider if: °· You cannot flush your port with saline as directed, or you cannot draw blood from the port. °· You have a fever or chills. °· You have more redness, swelling, or pain around your port insertion site. °· You have more fluid or blood coming from your port insertion site. °· Your port insertion site feels warm to the touch. °· You have pus or a bad smell coming from the port insertion site. °Get help right away if: °· You have chest pain or shortness of breath. °· You have bleeding from your port that you cannot control. °Summary °· Take care of the port as told by your health care provider. °· Change your dressing as told by your health care provider. °· Keep all follow-up visits as told by your health care provider. °  This information is not intended to replace advice given to you by your health care provider. Make sure you discuss any questions you have with your health care provider. °Document Released: 11/30/2012 Document Revised: 01/01/2016 Document Reviewed: 01/01/2016 °Elsevier Interactive Patient Education © 2017 [employer] ° °

## 2018-02-07 NOTE — Progress Notes (Signed)
LAbs reviewed with Dr. [name].  Ok to treat

## 2018-02-07 NOTE — Patient Instructions (Signed)
erCarfilzomib injection What is this medicine? CARFILZOMIB (kar FILZ oh mib) targets a specific protein within cancer cells and stops the cancer cells from growing. It is used to treat multiple myeloma. This medicine may be used for other purposes; ask your health care provider or pharmacist if you have questions. COMMON BRAND NAME(S): KYPROLIS What should I tell my health care provider before I take this medicine? They need to know if you have any of these conditions: -heart disease -history of blood clots -irregular heartbeat -kidney disease -liver disease -lung or breathing disease -an unusual or allergic reaction to carfilzomib, or other medicines, foods, dyes, or preservatives -pregnant or trying to get pregnant -breast-feeding How should I use this medicine? This medicine is for injection or infusion into a vein. It is given by a health care professional in a hospital or clinic setting. Talk to your pediatrician regarding the use of this medicine in children. Special care may be needed. Overdosage: If you think you have taken too much of this medicine contact a poison control center or emergency room at once. NOTE: This medicine is only for you. Do not share this medicine with others. What if I miss a dose? It is important not to miss your dose. Call your doctor or health care professional if you are unable to keep an appointment. What may interact with this medicine? Interactions are not expected. Give your health care provider a list of all the medicines, herbs, non-prescription drugs, or dietary supplements you use. Also tell them if you smoke, drink alcohol, or use illegal drugs. Some items may interact with your medicine. This list may not describe all possible interactions. Give your health care provider a list of all the medicines, herbs, non-prescription drugs, or dietary supplements you use. Also tell them if you smoke, drink alcohol, or use illegal drugs. Some items may  interact with your medicine. What should I watch for while using this medicine? Your condition will be monitored carefully while you are receiving this medicine. Report any side effects. Continue your course of treatment even though you feel ill unless your doctor tells you to stop. You may need blood work done while you are taking this medicine. Do not become pregnant while taking this medicine or for at least 30 days after stopping it. Women should inform their doctor if they wish to become pregnant or think they might be pregnant. There is a potential for serious side effects to an unborn child. Men should not father a child while taking this medicine and for 90 days after stopping it. Talk to your health care professional or pharmacist for more information. Do not breast-feed an infant while taking this medicine. Check with your doctor or health care professional if you get an attack of severe diarrhea, nausea and vomiting, or if you sweat a lot. The loss of too much body fluid can make it dangerous for you to take this medicine. You may get dizzy. Do not drive, use machinery, or do anything that needs mental alertness until you know how this medicine affects you. Do not stand or sit up quickly, especially if you are an older patient. This reduces the risk of dizzy or fainting spells. What side effects may I notice from receiving this medicine? Side effects that you should report to your doctor or health care professional as soon as possible: -allergic reactions like skin rash, itching or hives, swelling of the face, lips, or tongue -confusion -dizziness -feeling faint or lightheaded -fever or chills -  palpitations -seizures -signs and symptoms of bleeding such as bloody or black, tarry stools; red or dark-brown urine; spitting up blood or brown material that looks like coffee grounds; red spots on the skin; unusual bruising or bleeding including from the eye, gums, or nose -signs and symptoms of  a blood clot such as breathing problems; changes in vision; chest pain; severe, sudden headache; pain, swelling, warmth in the leg; trouble speaking; sudden numbness or weakness of the face, arm or leg -signs and symptoms of kidney injury like trouble passing urine or change in the amount of urine -signs and symptoms of liver injury like dark yellow or brown urine; general ill feeling or flu-like symptoms; light-colored stools; loss of appetite; nausea; right upper belly pain; unusually weak or tired; yellowing of the eyes or skin Side effects that usually do not require medical attention (report to your doctor or health care professional if they continue or are bothersome): -back pain -cough -diarrhea -headache -muscle cramps -vomiting This list may not describe all possible side effects. Call your doctor for medical advice about side effects. You may report side effects to FDA at 1-800-FDA-1088. Where should I keep my medicine? This drug is given in a hospital or clinic and will not be stored at home. NOTE: This sheet is a summary. It may not cover all possible information. If you have questions about this medicine, talk to your doctor, pharmacist, or health care provider.  2018 Elsevier/Gold Standard (2015-03-14 13:39:23)  Cyclophosphamide injection What is this medicine? CYCLOPHOSPHAMIDE (sye kloe FOSS fa mide) is a chemotherapy drug. It slows the growth of cancer cells. This medicine is used to treat many types of cancer like lymphoma, myeloma, leukemia, breast cancer, and ovarian cancer, to name a few. This medicine may be used for other purposes; ask your health care provider or pharmacist if you have questions. COMMON BRAND NAME(S): Cytoxan, Neosar What should I tell my health care provider before I take this medicine? They need to know if you have any of these conditions: -blood disorders -history of other chemotherapy -infection -kidney disease -liver disease -recent or ongoing  radiation therapy -tumors in the bone marrow -an unusual or allergic reaction to cyclophosphamide, other chemotherapy, other medicines, foods, dyes, or preservatives -pregnant or trying to get pregnant -breast-feeding How should I use this medicine? This drug is usually given as an injection into a vein or muscle or by infusion into a vein. It is administered in a hospital or clinic by a specially trained health care professional. Talk to your pediatrician regarding the use of this medicine in children. Special care may be needed. Overdosage: If you think you have taken too much of this medicine contact a poison control center or emergency room at once. NOTE: This medicine is only for you. Do not share this medicine with others. What if I miss a dose? It is important not to miss your dose. Call your doctor or health care professional if you are unable to keep an appointment. What may interact with this medicine? This medicine may interact with the following medications: -amiodarone -amphotericin B -azathioprine -certain antiviral medicines for HIV or AIDS such as protease inhibitors (e.g., indinavir, ritonavir) and zidovudine -certain blood pressure medications such as benazepril, captopril, enalapril, fosinopril, lisinopril, moexipril, monopril, perindopril, quinapril, ramipril, trandolapril -certain cancer medications such as anthracyclines (e.g., daunorubicin, doxorubicin), busulfan, cytarabine, paclitaxel, pentostatin, tamoxifen, trastuzumab -certain diuretics such as chlorothiazide, chlorthalidone, hydrochlorothiazide, indapamide, metolazone -certain medicines that treat or prevent blood clots like warfarin -certain   muscle relaxants such as succinylcholine -cyclosporine -etanercept -indomethacin -medicines to increase blood counts like filgrastim, pegfilgrastim, sargramostim -medicines used as general anesthesia -metronidazole -natalizumab This list may not describe all possible  interactions. Give your health care provider a list of all the medicines, herbs, non-prescription drugs, or dietary supplements you use. Also tell them if you smoke, drink alcohol, or use illegal drugs. Some items may interact with your medicine. What should I watch for while using this medicine? Visit your doctor for checks on your progress. This drug may make you feel generally unwell. This is not uncommon, as chemotherapy can affect healthy cells as well as cancer cells. Report any side effects. Continue your course of treatment even though you feel ill unless your doctor tells you to stop. Drink water or other fluids as directed. Urinate often, even at night. In some cases, you may be given additional medicines to help with side effects. Follow all directions for their use. Call your doctor or health care professional for advice if you get a fever, chills or sore throat, or other symptoms of a cold or flu. Do not treat yourself. This drug decreases your body's ability to fight infections. Try to avoid being around people who are sick. This medicine may increase your risk to bruise or bleed. Call your doctor or health care professional if you notice any unusual bleeding. Be careful brushing and flossing your teeth or using a toothpick because you may get an infection or bleed more easily. If you have any dental work done, tell your dentist you are receiving this medicine. You may get drowsy or dizzy. Do not drive, use machinery, or do anything that needs mental alertness until you know how this medicine affects you. Do not become pregnant while taking this medicine or for 1 year after stopping it. Women should inform their doctor if they wish to become pregnant or think they might be pregnant. Men should not father a child while taking this medicine and for 4 months after stopping it. There is a potential for serious side effects to an unborn child. Talk to your health care professional or pharmacist for  more information. Do not breast-feed an infant while taking this medicine. This medicine may interfere with the ability to have a child. This medicine has caused ovarian failure in some women. This medicine has caused reduced sperm counts in some men. You should talk with your doctor or health care professional if you are concerned about your fertility. If you are going to have surgery, tell your doctor or health care professional that you have taken this medicine. What side effects may I notice from receiving this medicine? Side effects that you should report to your doctor or health care professional as soon as possible: -allergic reactions like skin rash, itching or hives, swelling of the face, lips, or tongue -low blood counts - this medicine may decrease the number of white blood cells, red blood cells and platelets. You may be at increased risk for infections and bleeding. -signs of infection - fever or chills, cough, sore throat, pain or difficulty passing urine -signs of decreased platelets or bleeding - bruising, pinpoint red spots on the skin, black, tarry stools, blood in the urine -signs of decreased red blood cells - unusually weak or tired, fainting spells, lightheadedness -breathing problems -dark urine -dizziness -palpitations -swelling of the ankles, feet, hands -trouble passing urine or change in the amount of urine -weight gain -yellowing of the eyes or skin Side effects that   usually do not require medical attention (report to your doctor or health care professional if they continue or are bothersome): -changes in nail or skin color -hair loss -missed menstrual periods -mouth sores -nausea, vomiting This list may not describe all possible side effects. Call your doctor for medical advice about side effects. You may report side effects to FDA at 1-800-FDA-1088. Where should I keep my medicine? This drug is given in a hospital or clinic and will not be stored at  home. NOTE: This sheet is a summary. It may not cover all possible information. If you have questions about this medicine, talk to your doctor, pharmacist, or health care provider.  2018 Elsevier/Gold Standard (2011-12-25 16:22:58)  

## 2018-02-20 ENCOUNTER — Other Ambulatory Visit: Payer: Self-pay | Admitting: Family

## 2018-02-20 DIAGNOSIS — K219 Gastro-esophageal reflux disease without esophagitis: Secondary | ICD-10-CM

## 2018-02-24 ENCOUNTER — Other Ambulatory Visit: Payer: Self-pay | Admitting: *Deleted

## 2018-02-24 DIAGNOSIS — R29898 Other symptoms and signs involving the musculoskeletal system: Secondary | ICD-10-CM

## 2018-02-24 DIAGNOSIS — M79602 Pain in left arm: Secondary | ICD-10-CM

## 2018-02-28 ENCOUNTER — Other Ambulatory Visit: Payer: Self-pay

## 2018-02-28 ENCOUNTER — Inpatient Hospital Stay: Payer: Medicare Other

## 2018-02-28 ENCOUNTER — Other Ambulatory Visit: Payer: Self-pay | Admitting: Hematology & Oncology

## 2018-02-28 ENCOUNTER — Inpatient Hospital Stay: Payer: Medicare Other | Attending: Hematology & Oncology | Admitting: Family

## 2018-02-28 VITALS — BP 152/70 | HR 105 | Temp 98.0°F | Resp 20 | Wt 197.8 lb

## 2018-02-28 DIAGNOSIS — Z862 Personal history of diseases of the blood and blood-forming organs and certain disorders involving the immune mechanism: Secondary | ICD-10-CM | POA: Diagnosis not present

## 2018-02-28 DIAGNOSIS — E291 Testicular hypofunction: Secondary | ICD-10-CM | POA: Diagnosis not present

## 2018-02-28 DIAGNOSIS — D509 Iron deficiency anemia, unspecified: Secondary | ICD-10-CM | POA: Diagnosis not present

## 2018-02-28 DIAGNOSIS — N189 Chronic kidney disease, unspecified: Principal | ICD-10-CM

## 2018-02-28 DIAGNOSIS — D631 Anemia in chronic kidney disease: Secondary | ICD-10-CM

## 2018-02-28 DIAGNOSIS — C9000 Multiple myeloma not having achieved remission: Secondary | ICD-10-CM

## 2018-02-28 DIAGNOSIS — Z5112 Encounter for antineoplastic immunotherapy: Secondary | ICD-10-CM | POA: Insufficient documentation

## 2018-02-28 DIAGNOSIS — Z299 Encounter for prophylactic measures, unspecified: Secondary | ICD-10-CM

## 2018-02-28 DIAGNOSIS — M4712 Other spondylosis with myelopathy, cervical region: Secondary | ICD-10-CM

## 2018-02-28 DIAGNOSIS — D508 Other iron deficiency anemias: Secondary | ICD-10-CM

## 2018-02-28 DIAGNOSIS — E349 Endocrine disorder, unspecified: Secondary | ICD-10-CM

## 2018-02-28 LAB — CMP (CANCER CENTER ONLY)
ALT: 40 U/L (ref 0–44)
AST: 21 U/L (ref 15–41)
Albumin: 4.3 g/dL (ref 3.5–5.0)
Alkaline Phosphatase: 68 U/L (ref 38–126)
Anion gap: 9 (ref 5–15)
BUN: 28 mg/dL — ABNORMAL HIGH (ref 8–23)
CO2: 25 mmol/L (ref 22–32)
Calcium: 8.8 mg/dL — ABNORMAL LOW (ref 8.9–10.3)
Chloride: 104 mmol/L (ref 98–111)
Creatinine: 1.9 mg/dL — ABNORMAL HIGH (ref 0.61–1.24)
GFR, EST AFRICAN AMERICAN: 41 mL/min — AB (ref >60)
GFR, EST NON AFRICAN AMERICAN: 35 mL/min — AB (ref >60)
Glucose, Bld: 181 mg/dL — ABNORMAL HIGH (ref 70–99)
Potassium: 3.8 mmol/L (ref 3.5–5.1)
SODIUM: 138 mmol/L (ref 135–145)
Total Bilirubin: 0.3 mg/dL (ref 0.3–1.2)
Total Protein: 7.3 g/dL (ref 6.5–8.1)

## 2018-02-28 LAB — CBC WITH DIFFERENTIAL (CANCER CENTER ONLY)
Abs Immature Granulocytes: 0.05 10*3/uL (ref 0.00–0.07)
Basophils Absolute: 0 10*3/uL (ref 0.0–0.1)
Basophils Relative: 1 %
Eosinophils Absolute: 0.1 10*3/uL (ref 0.0–0.5)
Eosinophils Relative: 2 %
HCT: 31.6 % — ABNORMAL LOW (ref 39.0–52.0)
Hemoglobin: 9.7 g/dL — ABNORMAL LOW (ref 13.0–17.0)
Immature Granulocytes: 1 %
Lymphocytes Relative: 13 %
Lymphs Abs: 0.8 10*3/uL (ref 0.7–4.0)
MCH: 24.3 pg — ABNORMAL LOW (ref 26.0–34.0)
MCHC: 30.7 g/dL (ref 30.0–36.0)
MCV: 79.2 fL — AB (ref 80.0–100.0)
Monocytes Absolute: 0.7 10*3/uL (ref 0.1–1.0)
Monocytes Relative: 12 %
NEUTROS ABS: 4.4 10*3/uL (ref 1.7–7.7)
Neutrophils Relative %: 71 %
PLATELETS: 192 10*3/uL (ref 150–400)
RBC: 3.99 MIL/uL — ABNORMAL LOW (ref 4.22–5.81)
RDW: 17.8 % — ABNORMAL HIGH (ref 11.5–15.5)
WBC Count: 6.1 10*3/uL (ref 4.0–10.5)
nRBC: 0 % (ref 0.0–0.2)

## 2018-02-28 LAB — IRON AND TIBC
Iron: 83 ug/dL (ref 42–163)
SATURATION RATIOS: 32 % (ref 20–55)
TIBC: 260 ug/dL (ref 202–409)
UIBC: 177 ug/dL (ref 117–376)

## 2018-02-28 LAB — RETICULOCYTES
Immature Retic Fract: 15 % (ref 2.3–15.9)
RBC.: 3.99 MIL/uL — ABNORMAL LOW (ref 4.22–5.81)
RETIC COUNT ABSOLUTE: 35.5 10*3/uL (ref 19.0–186.0)
Retic Ct Pct: 0.9 % (ref 0.4–3.1)

## 2018-02-28 LAB — FERRITIN: Ferritin: 1260 ng/mL — ABNORMAL HIGH (ref 24–336)

## 2018-02-28 MED ORDER — SODIUM CHLORIDE 0.9% FLUSH
10.0000 mL | INTRAVENOUS | Status: DC | PRN
  Administered 2018-02-28: 10 mL
  Filled 2018-02-28: qty 10

## 2018-02-28 MED ORDER — PALONOSETRON HCL INJECTION 0.25 MG/5ML
INTRAVENOUS | Status: AC
  Filled 2018-02-28: qty 5

## 2018-02-28 MED ORDER — PALONOSETRON HCL INJECTION 0.25 MG/5ML
0.2500 mg | Freq: Once | INTRAVENOUS | Status: AC
  Administered 2018-02-28: 0.25 mg via INTRAVENOUS

## 2018-02-28 MED ORDER — SODIUM CHLORIDE 0.9 % IV SOLN
Freq: Once | INTRAVENOUS | Status: AC
  Filled 2018-02-28: qty 250

## 2018-02-28 MED ORDER — HYDROMORPHONE HCL 1 MG/ML IJ SOLN
2.0000 mg | Freq: Once | INTRAMUSCULAR | Status: AC
  Administered 2018-02-28: 2 mg via INTRAVENOUS

## 2018-02-28 MED ORDER — DEXTROSE 5 % IV SOLN
29.0000 mg/m2 | Freq: Once | INTRAVENOUS | Status: AC
  Administered 2018-02-28: 60 mg via INTRAVENOUS
  Filled 2018-02-28: qty 30

## 2018-02-28 MED ORDER — SODIUM CHLORIDE 0.9 % IV SOLN
INTRAVENOUS | Status: DC
  Administered 2018-02-28: 09:00:00 via INTRAVENOUS
  Filled 2018-02-28: qty 250

## 2018-02-28 MED ORDER — TESTOSTERONE CYPIONATE 200 MG/ML IM SOLN
INTRAMUSCULAR | Status: AC
  Filled 2018-02-28: qty 2

## 2018-02-28 MED ORDER — HYDROMORPHONE HCL 1 MG/ML IJ SOLN
INTRAMUSCULAR | Status: AC
  Filled 2018-02-28: qty 2

## 2018-02-28 MED ORDER — HEPARIN SOD (PORK) LOCK FLUSH 100 UNIT/ML IV SOLN
500.0000 [IU] | Freq: Once | INTRAVENOUS | Status: AC | PRN
  Administered 2018-02-28: 500 [IU]
  Filled 2018-02-28: qty 5

## 2018-02-28 MED ORDER — DARBEPOETIN ALFA 300 MCG/0.6ML IJ SOSY
PREFILLED_SYRINGE | INTRAMUSCULAR | Status: AC
  Filled 2018-02-28: qty 0.6

## 2018-02-28 MED ORDER — DEXAMETHASONE SODIUM PHOSPHATE 10 MG/ML IJ SOLN
10.0000 mg | Freq: Once | INTRAMUSCULAR | Status: AC
  Administered 2018-02-28: 10 mg via INTRAVENOUS

## 2018-02-28 MED ORDER — TESTOSTERONE CYPIONATE 200 MG/ML IM SOLN
400.0000 mg | Freq: Once | INTRAMUSCULAR | Status: AC
  Administered 2018-02-28: 400 mg via INTRAMUSCULAR

## 2018-02-28 MED ORDER — SODIUM CHLORIDE 0.9 % IV SOLN
300.0000 mg/m2 | Freq: Once | INTRAVENOUS | Status: AC
  Administered 2018-02-28: 620 mg via INTRAVENOUS
  Filled 2018-02-28: qty 31

## 2018-02-28 MED ORDER — DEXAMETHASONE SODIUM PHOSPHATE 10 MG/ML IJ SOLN
INTRAMUSCULAR | Status: AC
  Filled 2018-02-28: qty 1

## 2018-02-28 MED ORDER — DARBEPOETIN ALFA 300 MCG/0.6ML IJ SOSY
300.0000 ug | PREFILLED_SYRINGE | Freq: Once | INTRAMUSCULAR | Status: AC
  Administered 2018-02-28: 300 ug via SUBCUTANEOUS

## 2018-02-28 NOTE — Progress Notes (Signed)
Hematology and Oncology Follow Up Visit  Dalessandro Baldyga [id number] 11/12/1950 68 y.o. 02/28/2018   Principle Diagnosis:  IgG kappa myeloma Anemia secondary to renal insufficiency Intermittent iron - deficiency anemia Hypotestosteronemia  Current Therapy:   Aredia 60 mg IV q75months- next dose 03/2018 Aranesp 300 mcg subcu as needed for hemoglobin less than 11 DepoTestosterone400 mg q 4 weeks IV iron as indicated Kyprolis/Cytoxan - s/p cycle13   Interim History:  Mr. Creasy is here today for follow-up and treatment. He is doing fairly well but still having general aches and pains. His left elbow is bothering him and he states that he has an appointment with his orthopedist for injection.  Last month his M-spike was 0.3, IgG level was 879 mg/dL and kappa light chains 2.80 mg/dL. Creatinine is down to 1.90, BUN 28.  He was in the ED in mid December with epididymitis several days after having cystoscopy with urology. He completed treatment with an antibiotic and has since resolved.  No fever, chills, n/v, cough, rash, dizziness, SOB, chest pain, palpitations, abdominal pain or changes in bowel or bladder habits.  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  No swelling in his extremities at this time.  No lymphadenopathy noted on exam.  He has maintained a good appetite and is staying well hydrated. His weight is stable.   ECOG Performance Status: 1 - Symptomatic but completely ambulatory  Medications:  Allergies as of 02/28/2018      Reactions   Iodinated Diagnostic Agents Rash   Patient states he was instructed not to take IV contrast.  In 2008 he had an unknown reaction, and was told not to take it again.  He was also told not to take it due to his kidneys.   Iodine Anxiety, Rash, Other (See Comments)   Didn't feel right "instructed not to take per MD--something with his port"      Medication List       Accurate as of February 28, 2018  1:08 PM. Always use your  most recent med list.        amLODipine 10 MG tablet Commonly known as:  NORVASC Take 10 mg by mouth daily.   CARAFATE 1 GM/10ML suspension Generic drug:  sucralfate Take 1 g by mouth 4 (four) times daily.   cefdinir 300 MG capsule Commonly known as:  OMNICEF Take 1 capsule (300 mg total) by mouth 2 (two) times daily.   ciprofloxacin 500 MG tablet Commonly known as:  CIPRO Take 1 tablet (500 mg total) by mouth every 12 (twelve) hours.   CLEAR EYES ALL SEASONS 5-6 MG/ML Soln Generic drug:  Polyvinyl Alcohol-Povidone Place 2 drops into both eyes 3 (three) times daily as needed (drye eyes.).   cyclobenzaprine 10 MG tablet Commonly known as:  FLEXERIL Take 10 mg by mouth 3 (three) times daily.   dicyclomine 10 MG capsule Commonly known as:  BENTYL TAKE ONE CAPSULE BY MOUTH 3 TIMES A DAY   dronabinol 2.5 MG capsule Commonly known as:  MARINOL Take 1 capsule (2.5 mg total) by mouth 2 (two) times daily before a meal.   EMBEDA 30-1.2 MG Cpcr Generic drug:  Morphine-Naltrexone Take 3 (three) times daily by mouth.   EMBEDA 20-0.8 MG Cpcr Generic drug:  Morphine-Naltrexone Take 1 capsule by mouth 3 (three) times daily.   furosemide 40 MG tablet Commonly known as:  LASIX Take 40 mg by mouth.   glipiZIDE 5 MG tablet Commonly known as:  GLUCOTROL Take 5 mg  by mouth 2 (two) times daily before a meal.   HYDROcodone-acetaminophen 5-325 MG tablet Commonly known as:  NORCO Take 1-2 tablets by mouth every 6 (six) hours as needed.   latanoprost 0.005 % ophthalmic solution Commonly known as:  XALATAN Place 1 drop into the right eye every evening.   lidocaine-prilocaine cream Commonly known as:  EMLA Apply 1 application topically as needed.   linaclotide 145 MCG Caps capsule Commonly known as:  LINZESS Take 1 capsule (145 mcg total) by mouth daily before breakfast.   LORazepam 0.5 MG tablet Commonly known as:  ATIVAN Take 1 tablet (0.5 mg total) by mouth every 6 (six)  hours as needed (Nausea or vomiting).   losartan 25 MG tablet Commonly known as:  COZAAR Take 25 mg by mouth.   metoprolol succinate 100 MG 24 hr tablet Commonly known as:  TOPROL-XL Take 100 mg by mouth daily.   mirabegron ER 50 MG Tb24 tablet Commonly known as:  MYRBETRIQ Take 50 mg by mouth daily.   omeprazole 40 MG capsule Commonly known as:  PRILOSEC TAKE 1 CAPSULE BY MOUTH TWICE A DAY   ondansetron 8 MG tablet Commonly known as:  ZOFRAN TAKE 1 TABLET(8 MG) BY MOUTH TWICE DAILY AS NEEDED FOR NAUSEA OR VOMITING   orphenadrine 100 MG tablet Commonly known as:  NORFLEX Take 1 tablet (100 mg total) by mouth 2 (two) times daily.   oxyCODONE-acetaminophen 10-325 MG tablet Commonly known as:  PERCOCET Take 1 tablet by mouth every 6 (six) hours as needed for pain.   potassium chloride SA 20 MEQ tablet Commonly known as:  K-DUR,KLOR-CON Take 1 tablet (20 mEq total) by mouth 2 (two) times daily.   prochlorperazine 10 MG tablet Commonly known as:  COMPAZINE take 1 tablet by mouth every 6 hours if needed for nausea and vomiting   Sennosides 25 MG Tabs Take 25 mg by mouth daily as needed (constipation).   Vitamin D (Ergocalciferol) 1.25 MG (50000 UT) Caps capsule Commonly known as:  DRISDOL Take 50,000 Units by mouth every Saturday.   ZIOPTAN 0.0015 % Soln Generic drug:  Tafluprost (PF) Place 1 drop into both eyes daily.       Allergies:  Allergies  Allergen Reactions  . Iodinated Diagnostic Agents Rash    Patient states he was instructed not to take IV contrast.  In 2008 he had an unknown reaction, and was told not to take it again.  He was also told not to take it due to his kidneys.  . Iodine Anxiety, Rash and Other (See Comments)    Didn't feel right "instructed not to take per MD--something with his port"     Past Medical History, Surgical history, Social history, and Family History were reviewed and updated.  Review of Systems: All other 10 point review  of systems is negative.   Physical Exam:  weight is 197 lb 12 oz (89.7 kg). His oral temperature is 98 F (36.7 C). His blood pressure is 152/70 (abnormal) and his pulse is 105 (abnormal). His respiration is 20 and oxygen saturation is 99%.   Wt Readings from Last 3 Encounters:  02/28/18 197 lb 12 oz (89.7 kg)  02/03/18 198 lb (89.8 kg)  01/24/18 201 lb 12.8 oz (91.5 kg)    Ocular: Sclerae unicteric, pupils equal, round and reactive to light Ear-nose-throat: Oropharynx clear, dentition fair Lymphatic: No cervical, supraclavicular or axillary adenopathy Lungs no rales or rhonchi, good excursion bilaterally Heart regular rate and rhythm, no murmur appreciated  Abd soft, nontender, positive bowel sounds, no liver or spleen tip palpated on exam, no fluid wave  MSK no focal spinal tenderness, no joint edema Neuro: non-focal, well-oriented, appropriate affect Breasts: Deferred   Lab Results  Component Value Date   WBC 6.1 02/28/2018   HGB 9.7 (L) 02/28/2018   HCT 31.6 (L) 02/28/2018   MCV 79.2 (L) 02/28/2018   PLT 192 02/28/2018   Lab Results  Component Value Date   FERRITIN 912 (H) 01/24/2018   IRON 83 02/28/2018   TIBC 260 02/28/2018   UIBC 177 02/28/2018   IRONPCTSAT 32 02/28/2018   Lab Results  Component Value Date   RETICCTPCT 0.9 02/28/2018   RBC 3.99 (L) 02/28/2018   RBC 3.99 (L) 02/28/2018   RETICCTABS 27.5 03/21/2014   Lab Results  Component Value Date   KPAFRELGTCHN 28.0 (H) 01/24/2018   LAMBDASER 20.8 01/24/2018   KAPLAMBRATIO 1.35 01/24/2018   Lab Results  Component Value Date   IGGSERUM 879 01/24/2018   IGGSERUM 893 01/24/2018   IGA 154 01/24/2018   IGA 157 01/24/2018   IGMSERUM 18 (L) 01/24/2018   IGMSERUM 18 (L) 01/24/2018   Lab Results  Component Value Date   TOTALPROTELP 6.3 01/24/2018   ALBUMINELP 3.5 01/24/2018   A1GS 0.2 01/24/2018   A2GS 1.0 01/24/2018   BETS 0.8 01/24/2018   BETA2SER 0.5 11/07/2014   GAMS 0.8 01/24/2018   MSPIKE 0.3  (H) 01/24/2018   SPEI Comment 12/27/2017     Chemistry      Component Value Date/Time   NA 138 02/28/2018 0805   NA 143 02/22/2017 0744   K 3.8 02/28/2018 0805   K 4.0 02/22/2017 0744   CL 104 02/28/2018 0805   CL 106 02/22/2017 0744   CO2 25 02/28/2018 0805   CO2 27 02/22/2017 0744   BUN 28 (H) 02/28/2018 0805   BUN 26 (H) 02/22/2017 0744   CREATININE 1.90 (H) 02/28/2018 0805   CREATININE 2.3 (H) 02/22/2017 0744      Component Value Date/Time   CALCIUM 8.8 (L) 02/28/2018 0805   CALCIUM 9.3 02/22/2017 0744   ALKPHOS 68 02/28/2018 0805   ALKPHOS 80 02/22/2017 0744   AST 21 02/28/2018 0805   ALT 40 02/28/2018 0805   ALT 22 02/22/2017 0744   BILITOT 0.3 02/28/2018 0805      Impression and Plan: Mr. Beigel is a very pleasant 68 yo African American gentleman with IgG kappa myeloma. He continues to tolerate treatment nicely and has had a nice response.  We will proceed with treatment today as planned.  Myeloma studies are pending.  Aredia will be due again in February.  He has his current treatment schedule and we will see him again in a month for MD follow-up.  He will contact our office with any questions or concerns. We can certainly see him sooner if need be.   Laverna Peace, NP 1/6/20201:08 PM

## 2018-02-28 NOTE — Patient Instructions (Signed)
Cyclophosphamide injection What is this medicine? CYCLOPHOSPHAMIDE (sye kloe FOSS fa mide) is a chemotherapy drug. It slows the growth of cancer cells. This medicine is used to treat many types of cancer like lymphoma, myeloma, leukemia, breast cancer, and ovarian cancer, to name a few. This medicine may be used for other purposes; ask your health care provider or pharmacist if you have questions. COMMON BRAND NAME(S): Cytoxan, Neosar What should I tell my health care provider before I take this medicine? They need to know if you have any of these conditions: -blood disorders -history of other chemotherapy -infection -kidney disease -liver disease -recent or ongoing radiation therapy -tumors in the bone marrow -an unusual or allergic reaction to cyclophosphamide, other chemotherapy, other medicines, foods, dyes, or preservatives -pregnant or trying to get pregnant -breast-feeding How should I use this medicine? This drug is usually given as an injection into a vein or muscle or by infusion into a vein. It is administered in a hospital or clinic by a specially trained health care professional. Talk to your pediatrician regarding the use of this medicine in children. Special care may be needed. Overdosage: If you think you have taken too much of this medicine contact a poison control center or emergency room at once. NOTE: This medicine is only for you. Do not share this medicine with others. What if I miss a dose? It is important not to miss your dose. Call your doctor or health care professional if you are unable to keep an appointment. What may interact with this medicine? This medicine may interact with the following medications: -amiodarone -amphotericin B -azathioprine -certain antiviral medicines for HIV or AIDS such as protease inhibitors (e.g., indinavir, ritonavir) and zidovudine -certain blood pressure medications such as benazepril, captopril, enalapril, fosinopril,  lisinopril, moexipril, monopril, perindopril, quinapril, ramipril, trandolapril -certain cancer medications such as anthracyclines (e.g., daunorubicin, doxorubicin), busulfan, cytarabine, paclitaxel, pentostatin, tamoxifen, trastuzumab -certain diuretics such as chlorothiazide, chlorthalidone, hydrochlorothiazide, indapamide, metolazone -certain medicines that treat or prevent blood clots like warfarin -certain muscle relaxants such as succinylcholine -cyclosporine -etanercept -indomethacin -medicines to increase blood counts like filgrastim, pegfilgrastim, sargramostim -medicines used as general anesthesia -metronidazole -natalizumab This list may not describe all possible interactions. Give your health care provider a list of all the medicines, herbs, non-prescription drugs, or dietary supplements you use. Also tell them if you smoke, drink alcohol, or use illegal drugs. Some items may interact with your medicine. What should I watch for while using this medicine? Visit your doctor for checks on your progress. This drug may make you feel generally unwell. This is not uncommon, as chemotherapy can affect healthy cells as well as cancer cells. Report any side effects. Continue your course of treatment even though you feel ill unless your doctor tells you to stop. Drink water or other fluids as directed. Urinate often, even at night. In some cases, you may be given additional medicines to help with side effects. Follow all directions for their use. Call your doctor or health care professional for advice if you get a fever, chills or sore throat, or other symptoms of a cold or flu. Do not treat yourself. This drug decreases your body's ability to fight infections. Try to avoid being around people who are sick. This medicine may increase your risk to bruise or bleed. Call your doctor or health care professional if you notice any unusual bleeding. Be careful brushing and flossing your teeth or using a  toothpick because you may get an infection or bleed   more easily. If you have any dental work done, tell your dentist you are receiving this medicine. You may get drowsy or dizzy. Do not drive, use machinery, or do anything that needs mental alertness until you know how this medicine affects you. Do not become pregnant while taking this medicine or for 1 year after stopping it. Women should inform their doctor if they wish to become pregnant or think they might be pregnant. Men should not father a child while taking this medicine and for 4 months after stopping it. There is a potential for serious side effects to an unborn child. Talk to your health care professional or pharmacist for more information. Do not breast-feed an infant while taking this medicine. This medicine may interfere with the ability to have a child. This medicine has caused ovarian failure in some women. This medicine has caused reduced sperm counts in some men. You should talk with your doctor or health care professional if you are concerned about your fertility. If you are going to have surgery, tell your doctor or health care professional that you have taken this medicine. What side effects may I notice from receiving this medicine? Side effects that you should report to your doctor or health care professional as soon as possible: -allergic reactions like skin rash, itching or hives, swelling of the face, lips, or tongue -low blood counts - this medicine may decrease the number of white blood cells, red blood cells and platelets. You may be at increased risk for infections and bleeding. -signs of infection - fever or chills, cough, sore throat, pain or difficulty passing urine -signs of decreased platelets or bleeding - bruising, pinpoint red spots on the skin, black, tarry stools, blood in the urine -signs of decreased red blood cells - unusually weak or tired, fainting spells, lightheadedness -breathing problems -dark  urine -dizziness -palpitations -swelling of the ankles, feet, hands -trouble passing urine or change in the amount of urine -weight gain -yellowing of the eyes or skin Side effects that usually do not require medical attention (report to your doctor or health care professional if they continue or are bothersome): -changes in nail or skin color -hair loss -missed menstrual periods -mouth sores -nausea, vomiting This list may not describe all possible side effects. Call your doctor for medical advice about side effects. You may report side effects to FDA at 1-800-FDA-1088. Where should I keep my medicine? This drug is given in a hospital or clinic and will not be stored at home. NOTE: This sheet is a summary. It may not cover all possible information. If you have questions about this medicine, talk to your doctor, pharmacist, or health care provider.  2019 Elsevier/Gold Standard (2011-12-25 16:22:58) Carfilzomib injection What is this medicine? CARFILZOMIB (kar FILZ oh mib) targets a specific protein within cancer cells and stops the cancer cells from growing. It is used to treat multiple myeloma. This medicine may be used for other purposes; ask your health care provider or pharmacist if you have questions. COMMON BRAND NAME(S): KYPROLIS What should I tell my health care provider before I take this medicine? They need to know if you have any of these conditions: -heart disease -history of blood clots -irregular heartbeat -kidney disease -liver disease -lung or breathing disease -an unusual or allergic reaction to carfilzomib, or other medicines, foods, dyes, or preservatives -pregnant or trying to get pregnant -breast-feeding How should I use this medicine? This medicine is for injection or infusion into a vein. It is given  by a health care professional in a hospital or clinic setting. Talk to your pediatrician regarding the use of this medicine in children. Special care may be  needed. Overdosage: If you think you have taken too much of this medicine contact a poison control center or emergency room at once. NOTE: This medicine is only for you. Do not share this medicine with others. What if I miss a dose? It is important not to miss your dose. Call your doctor or health care professional if you are unable to keep an appointment. What may interact with this medicine? Interactions are not expected. Give your health care provider a list of all the medicines, herbs, non-prescription drugs, or dietary supplements you use. Also tell them if you smoke, drink alcohol, or use illegal drugs. Some items may interact with your medicine. This list may not describe all possible interactions. Give your health care provider a list of all the medicines, herbs, non-prescription drugs, or dietary supplements you use. Also tell them if you smoke, drink alcohol, or use illegal drugs. Some items may interact with your medicine. What should I watch for while using this medicine? Your condition will be monitored carefully while you are receiving this medicine. Report any side effects. Continue your course of treatment even though you feel ill unless your doctor tells you to stop. You may need blood work done while you are taking this medicine. Do not become pregnant while taking this medicine or for at least 6 months after stopping it. Women should inform their doctor if they wish to become pregnant or think they might be pregnant. There is a potential for serious side effects to an unborn child. Men should not father a child while taking this medicine and for at least 3 months after stopping it. Talk to your health care professional or pharmacist for more information. Do not breast-feed an infant while taking this medicine or for 2 weeks after the last dose. Check with your doctor or health care professional if you get an attack of severe diarrhea, nausea and vomiting, or if you sweat a lot. The  loss of too much body fluid can make it dangerous for you to take this medicine. You may get dizzy. Do not drive, use machinery, or do anything that needs mental alertness until you know how this medicine affects you. Do not stand or sit up quickly, especially if you are an older patient. This reduces the risk of dizzy or fainting spells. What side effects may I notice from receiving this medicine? Side effects that you should report to your doctor or health care professional as soon as possible: -allergic reactions like skin rash, itching or hives, swelling of the face, lips, or tongue -confusion -dizziness -feeling faint or lightheaded -fever or chills -palpitations -seizures -signs and symptoms of bleeding such as bloody or black, tarry stools; red or dark-brown urine; spitting up blood or brown material that looks like coffee grounds; red spots on the skin; unusual bruising or bleeding including from the eye, gums, or nose -signs and symptoms of a blood clot such as breathing problems; changes in vision; chest pain; severe, sudden headache; pain, swelling, warmth in the leg; trouble speaking; sudden numbness or weakness of the face, arm or leg -signs and symptoms of kidney injury like trouble passing urine or change in the amount of urine -signs and symptoms of liver injury like dark yellow or brown urine; general ill feeling or flu-like symptoms; light-colored stools; loss of appetite; nausea; right   upper belly pain; unusually weak or tired; yellowing of the eyes or skin Side effects that usually do not require medical attention (report to your doctor or health care professional if they continue or are bothersome): -back pain -cough -diarrhea -headache -muscle cramps -vomiting This list may not describe all possible side effects. Call your doctor for medical advice about side effects. You may report side effects to FDA at 1-800-FDA-1088. Where should I keep my medicine? This drug is  given in a hospital or clinic and will not be stored at home. NOTE: This sheet is a summary. It may not cover all possible information. If you have questions about this medicine, talk to your doctor, pharmacist, or health care provider.  2019 Elsevier/Gold Standard (2016-11-25 14:07:13)  

## 2018-03-01 ENCOUNTER — Ambulatory Visit (INDEPENDENT_AMBULATORY_CARE_PROVIDER_SITE_OTHER): Payer: Medicare Other | Admitting: Neurology

## 2018-03-01 DIAGNOSIS — M5412 Radiculopathy, cervical region: Secondary | ICD-10-CM

## 2018-03-01 DIAGNOSIS — E1142 Type 2 diabetes mellitus with diabetic polyneuropathy: Secondary | ICD-10-CM

## 2018-03-01 DIAGNOSIS — R29898 Other symptoms and signs involving the musculoskeletal system: Secondary | ICD-10-CM

## 2018-03-01 DIAGNOSIS — G5622 Lesion of ulnar nerve, left upper limb: Secondary | ICD-10-CM

## 2018-03-01 LAB — PROTEIN ELECTROPHORESIS, SERUM
A/G Ratio: 1.1 (ref 0.7–1.7)
ALBUMIN ELP: 3.6 g/dL (ref 2.9–4.4)
Alpha-1-Globulin: 0.2 g/dL (ref 0.0–0.4)
Alpha-2-Globulin: 1.1 g/dL — ABNORMAL HIGH (ref 0.4–1.0)
Beta Globulin: 0.9 g/dL (ref 0.7–1.3)
Gamma Globulin: 0.9 g/dL (ref 0.4–1.8)
Globulin, Total: 3.2 g/dL (ref 2.2–3.9)
M-Spike, %: 0.3 g/dL — ABNORMAL HIGH
Total Protein ELP: 6.8 g/dL (ref 6.0–8.5)

## 2018-03-01 LAB — IGG, IGA, IGM
IGG (IMMUNOGLOBIN G), SERUM: 987 mg/dL (ref 700–1600)
IgA: 170 mg/dL (ref 61–437)
IgM (Immunoglobulin M), Srm: 22 mg/dL (ref 20–172)

## 2018-03-01 LAB — KAPPA/LAMBDA LIGHT CHAINS
KAPPA FREE LGHT CHN: 20.8 mg/L — AB (ref 3.3–19.4)
Kappa, lambda light chain ratio: 1.38 (ref 0.26–1.65)
Lambda free light chains: 15.1 mg/L (ref 5.7–26.3)

## 2018-03-01 NOTE — Procedures (Signed)
[facility]  [address]  [place], [place] [address] Tel: [phone] Fax:  [phone] Test Date:  03/01/2018  Patient: [name] DOB: Jul 18, 1950 Physician: Narda Amber, DO  Sex: Male Height: _0  Ref Phys: Faythe Casa, MD  ID#: [id number] Temp: 32.0C Technician:    Patient Complaints: This is a 68 year old man with multiple myeloma, diabetes mellitus, s/p cervical fusion at C3-C6, and history of left elbow fracture s/p several surgeries referred for evaluation of left hand weakness and paresthesias.  NCV & EMG Findings: Extensive electrodiagnostic testing of the left upper extremity shows:  1. Left median and radial sensory responses show reduced amplitude (L6.7, L4.8 V).  Left ulnar sensory response is absent. 2. Left median motor responses within normal limits.  Left ulnar motor response is severely reduced and barely present at the abductor digit he minimi and first dorsal interosseous muscles. 3. Chronic motor axonal loss changes are seen affecting nearly all the tested muscles in the left upper extremity, however findings are severe in the ulnar innervated muscles, as well as C5-C7 myotomes.  Impression: This is a complex study.  Findings are as follows: 1. 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 2. 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  3. 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     ___________________________ Narda Amber, DO    Nerve Conduction Studies Anti Sensory Summary Table   Site NR Peak (ms) Norm Peak (ms) P-T Amp (V) Norm P-T Amp  Left Median Anti Sensory (2nd Digit)  32C  Wrist    3.7 <3.8 6.7 >10  Left Radial Anti Sensory (Base 1st Digit)  32C  Wrist    2.8 <2.8 4.8 >10  Left Ulnar Anti Sensory (5th Digit)  32C  Wrist NR  <3.2  >5   Motor Summary  Table   Site NR Onset (ms) Norm Onset (ms) O-P Amp (mV) Norm O-P Amp Site1 Site2 Delta-0 (ms) Dist (cm) Vel (m/s) Norm Vel (m/s)  Left Median Motor (Abd Poll Brev)  32C  Wrist    3.8 <4.0 10.3 >5 Elbow Wrist 6.2 31.0 50 >50  Elbow    10.0  9.7         Left Ulnar Motor (Abd Dig Minimi)  32C  Wrist    5.5 <3.1 0.5 >7 B Elbow Wrist 8.7 25.0 29 >50  B Elbow    14.2  0.3  A Elbow B Elbow 3.1 10.0 32 >50  A Elbow    17.3  0.2         Left Ulnar (FDI) Motor (1st DI)  32C  Wrist    3.5 <4.5 1.1 >7 B Elbow Wrist  31.0  >50  B Elbow NR     A Elbow B Elbow  10.0  >50  A Elbow NR             EMG   Side Muscle Ins Act Fibs Psw Fasc Number Recrt Dur Dur. Amp Amp. Poly Poly. Comment  Left ABD Dig Min Nml Nml Nml Nml NE None - - - - - - ATR  Left Biceps Nml Nml Nml Nml 1- Rapid Some 1+ Some 1+ Some 1+ N/A  Left Abd Poll Brev Nml Nml Nml Nml 1- Rapid Few 1+ Few 1+ Nml Nml N/A  Left Ext Indicis Nml Nml Nml Nml 1- Rapid Few 1+ Few 1+ Nml  Nml N/A  Left PronatorTeres Nml Nml Nml Nml 1- Rapid Some 1+ Some 1+ Some 1+ N/A  Left FlexCarpiUln Nml Nml Nml Nml SMU Rapid All 1+ All 1+ All 1+ N/A  Left Triceps Nml Nml Nml Nml 1- Rapid Some 1+ Some 1+ Some 1+ N/A  Left Deltoid Nml Nml Nml Nml 1- Rapid Some 1+ Some 1+ Some 1+ N/A  Left 1stDorInt Nml Nml Nml Nml [facility] Rapid All 1+ All 1+ All 1+ ATR      Waveforms:

## 2018-03-04 ENCOUNTER — Other Ambulatory Visit: Payer: Self-pay | Admitting: *Deleted

## 2018-03-04 DIAGNOSIS — C9000 Multiple myeloma not having achieved remission: Secondary | ICD-10-CM

## 2018-03-07 ENCOUNTER — Inpatient Hospital Stay: Payer: Medicare Other

## 2018-03-07 DIAGNOSIS — N189 Chronic kidney disease, unspecified: Principal | ICD-10-CM

## 2018-03-07 DIAGNOSIS — C9000 Multiple myeloma not having achieved remission: Secondary | ICD-10-CM

## 2018-03-07 DIAGNOSIS — D631 Anemia in chronic kidney disease: Secondary | ICD-10-CM

## 2018-03-07 DIAGNOSIS — M4712 Other spondylosis with myelopathy, cervical region: Secondary | ICD-10-CM

## 2018-03-07 DIAGNOSIS — Z299 Encounter for prophylactic measures, unspecified: Secondary | ICD-10-CM

## 2018-03-07 DIAGNOSIS — E349 Endocrine disorder, unspecified: Secondary | ICD-10-CM

## 2018-03-07 DIAGNOSIS — Z5112 Encounter for antineoplastic immunotherapy: Secondary | ICD-10-CM | POA: Diagnosis not present

## 2018-03-07 LAB — CMP (CANCER CENTER ONLY)
ALT: 20 U/L (ref 0–44)
ANION GAP: 9 (ref 5–15)
AST: 12 U/L — AB (ref 15–41)
Albumin: 4.2 g/dL (ref 3.5–5.0)
Alkaline Phosphatase: 51 U/L (ref 38–126)
BUN: 33 mg/dL — ABNORMAL HIGH (ref 8–23)
CO2: 25 mmol/L (ref 22–32)
Calcium: 8.7 mg/dL — ABNORMAL LOW (ref 8.9–10.3)
Chloride: 104 mmol/L (ref 98–111)
Creatinine: 2.97 mg/dL — ABNORMAL HIGH (ref 0.61–1.24)
GFR, Est AFR Am: 24 mL/min — ABNORMAL LOW (ref >60)
GFR, Estimated: 21 mL/min — ABNORMAL LOW (ref >60)
Glucose, Bld: 129 mg/dL — ABNORMAL HIGH (ref 70–99)
Potassium: 4 mmol/L (ref 3.5–5.1)
Sodium: 138 mmol/L (ref 135–145)
Total Bilirubin: 0.3 mg/dL (ref 0.3–1.2)
Total Protein: 6.9 g/dL (ref 6.5–8.1)

## 2018-03-07 LAB — CBC WITH DIFFERENTIAL (CANCER CENTER ONLY)
Abs Immature Granulocytes: 0.08 10*3/uL — ABNORMAL HIGH (ref 0.00–0.07)
Basophils Absolute: 0 10*3/uL (ref 0.0–0.1)
Basophils Relative: 0 %
Eosinophils Absolute: 0.2 10*3/uL (ref 0.0–0.5)
Eosinophils Relative: 2 %
HCT: 32.7 % — ABNORMAL LOW (ref 39.0–52.0)
Hemoglobin: 10.1 g/dL — ABNORMAL LOW (ref 13.0–17.0)
IMMATURE GRANULOCYTES: 1 %
Lymphocytes Relative: 12 %
Lymphs Abs: 0.9 10*3/uL (ref 0.7–4.0)
MCH: 25 pg — ABNORMAL LOW (ref 26.0–34.0)
MCHC: 30.9 g/dL (ref 30.0–36.0)
MCV: 80.9 fL (ref 80.0–100.0)
Monocytes Absolute: 1.2 10*3/uL — ABNORMAL HIGH (ref 0.1–1.0)
Monocytes Relative: 16 %
Neutro Abs: 5.4 10*3/uL (ref 1.7–7.7)
Neutrophils Relative %: 69 %
Platelet Count: 159 10*3/uL (ref 150–400)
RBC: 4.04 MIL/uL — ABNORMAL LOW (ref 4.22–5.81)
RDW: 20.3 % — AB (ref 11.5–15.5)
WBC Count: 7.8 10*3/uL (ref 4.0–10.5)
nRBC: 0.4 % — ABNORMAL HIGH (ref 0.0–0.2)

## 2018-03-07 MED ORDER — HYDROMORPHONE HCL 4 MG/ML IJ SOLN
INTRAMUSCULAR | Status: AC
  Filled 2018-03-07: qty 1

## 2018-03-07 MED ORDER — DEXAMETHASONE SODIUM PHOSPHATE 10 MG/ML IJ SOLN
10.0000 mg | Freq: Once | INTRAMUSCULAR | Status: AC
  Administered 2018-03-07: 10 mg via INTRAVENOUS

## 2018-03-07 MED ORDER — PALONOSETRON HCL INJECTION 0.25 MG/5ML
INTRAVENOUS | Status: AC
  Filled 2018-03-07: qty 5

## 2018-03-07 MED ORDER — SODIUM CHLORIDE 0.9 % IV SOLN
Freq: Once | INTRAVENOUS | Status: AC
  Administered 2018-03-07: 10:00:00 via INTRAVENOUS
  Filled 2018-03-07: qty 250

## 2018-03-07 MED ORDER — DEXTROSE 5 % IV SOLN
29.0000 mg/m2 | Freq: Once | INTRAVENOUS | Status: AC
  Administered 2018-03-07: 60 mg via INTRAVENOUS
  Filled 2018-03-07: qty 30

## 2018-03-07 MED ORDER — TESTOSTERONE CYPIONATE 200 MG/ML IM SOLN: 400.0000 mg | Freq: Once | INTRAMUSCULAR | Status: DC

## 2018-03-07 MED ORDER — PALONOSETRON HCL INJECTION 0.25 MG/5ML
0.2500 mg | Freq: Once | INTRAVENOUS | Status: AC
  Administered 2018-03-07: 0.25 mg via INTRAVENOUS

## 2018-03-07 MED ORDER — SODIUM CHLORIDE 0.9 % IV SOLN
Freq: Once | INTRAVENOUS | Status: DC
  Filled 2018-03-07: qty 250

## 2018-03-07 MED ORDER — DEXAMETHASONE SODIUM PHOSPHATE 10 MG/ML IJ SOLN
INTRAMUSCULAR | Status: AC
  Filled 2018-03-07: qty 1

## 2018-03-07 MED ORDER — HEPARIN SOD (PORK) LOCK FLUSH 100 UNIT/ML IV SOLN
500.0000 [IU] | Freq: Once | INTRAVENOUS | Status: AC | PRN
  Administered 2018-03-07: 500 [IU]
  Filled 2018-03-07: qty 5

## 2018-03-07 MED ORDER — SODIUM CHLORIDE 0.9% FLUSH
10.0000 mL | INTRAVENOUS | Status: DC | PRN
  Administered 2018-03-07: 10 mL
  Filled 2018-03-07: qty 10

## 2018-03-07 MED ORDER — SODIUM CHLORIDE 0.9 % IV SOLN
300.0000 mg/m2 | Freq: Once | INTRAVENOUS | Status: AC
  Administered 2018-03-07: 620 mg via INTRAVENOUS
  Filled 2018-03-07: qty 31

## 2018-03-07 MED ORDER — SODIUM CHLORIDE 0.9 % IV SOLN: 60.0000 mg | Freq: Once | INTRAVENOUS | Status: DC

## 2018-03-07 MED ORDER — HYDROMORPHONE HCL 1 MG/ML IJ SOLN
2.0000 mg | Freq: Once | INTRAMUSCULAR | Status: AC
  Administered 2018-03-07: 2 mg via INTRAVENOUS

## 2018-03-07 NOTE — Patient Instructions (Signed)
[facility] Discharge Instructions for Patients Receiving Chemotherapy  Today you received the following chemotherapy agents Cytoxan and Kyprolis  To help prevent nausea and vomiting after your treatment, we encourage you to take your nausea medication as directed   If you develop nausea and vomiting that is not controlled by your nausea medication, call the clinic.   BELOW ARE SYMPTOMS THAT SHOULD BE REPORTED IMMEDIATELY:  *FEVER GREATER THAN 100.5 F  *CHILLS WITH OR WITHOUT FEVER  NAUSEA AND VOMITING THAT IS NOT CONTROLLED WITH YOUR NAUSEA MEDICATION  *UNUSUAL SHORTNESS OF BREATH  *UNUSUAL BRUISING OR BLEEDING  TENDERNESS IN MOUTH AND THROAT WITH OR WITHOUT PRESENCE OF ULCERS  *URINARY PROBLEMS  *BOWEL PROBLEMS  UNUSUAL RASH Items with * indicate a potential emergency and should be followed up as soon as possible.  Feel free to call the clinic you have any questions or concerns. The clinic phone number is [phone].  Please show the CHEMO ALERT CARD at check-in to the Emergency Department and triage nurse.   

## 2018-03-07 NOTE — Progress Notes (Signed)
OK to treat today and to give Aredia with renal lab results today, Per MD Ennever

## 2018-03-11 ENCOUNTER — Other Ambulatory Visit: Payer: Self-pay | Admitting: *Deleted

## 2018-03-11 DIAGNOSIS — C9000 Multiple myeloma not having achieved remission: Secondary | ICD-10-CM

## 2018-03-14 ENCOUNTER — Inpatient Hospital Stay: Payer: Medicare Other

## 2018-03-14 DIAGNOSIS — N189 Chronic kidney disease, unspecified: Principal | ICD-10-CM

## 2018-03-14 DIAGNOSIS — C9000 Multiple myeloma not having achieved remission: Secondary | ICD-10-CM

## 2018-03-14 DIAGNOSIS — E349 Endocrine disorder, unspecified: Secondary | ICD-10-CM

## 2018-03-14 DIAGNOSIS — Z5112 Encounter for antineoplastic immunotherapy: Secondary | ICD-10-CM | POA: Diagnosis not present

## 2018-03-14 DIAGNOSIS — Z299 Encounter for prophylactic measures, unspecified: Secondary | ICD-10-CM

## 2018-03-14 DIAGNOSIS — D631 Anemia in chronic kidney disease: Secondary | ICD-10-CM

## 2018-03-14 LAB — CMP (CANCER CENTER ONLY)
ALT: 10 U/L (ref 0–44)
AST: 10 U/L — AB (ref 15–41)
Albumin: 4.2 g/dL (ref 3.5–5.0)
Alkaline Phosphatase: 57 U/L (ref 38–126)
Anion gap: 7 (ref 5–15)
BUN: 24 mg/dL — ABNORMAL HIGH (ref 8–23)
CO2: 25 mmol/L (ref 22–32)
Calcium: 9.2 mg/dL (ref 8.9–10.3)
Chloride: 103 mmol/L (ref 98–111)
Creatinine: 2.03 mg/dL — ABNORMAL HIGH (ref 0.61–1.24)
GFR, Est AFR Am: 38 mL/min — ABNORMAL LOW (ref >60)
GFR, Estimated: 33 mL/min — ABNORMAL LOW (ref >60)
Glucose, Bld: 245 mg/dL — ABNORMAL HIGH (ref 70–99)
POTASSIUM: 4.8 mmol/L (ref 3.5–5.1)
Sodium: 135 mmol/L (ref 135–145)
Total Bilirubin: 0.5 mg/dL (ref 0.3–1.2)
Total Protein: 6.5 g/dL (ref 6.5–8.1)

## 2018-03-14 LAB — CBC WITH DIFFERENTIAL (CANCER CENTER ONLY)
Abs Immature Granulocytes: 0.04 10*3/uL (ref 0.00–0.07)
BASOS ABS: 0 10*3/uL (ref 0.0–0.1)
Basophils Relative: 0 %
Eosinophils Absolute: 0.1 10*3/uL (ref 0.0–0.5)
Eosinophils Relative: 2 %
HEMATOCRIT: 32.9 % — AB (ref 39.0–52.0)
Hemoglobin: 10.3 g/dL — ABNORMAL LOW (ref 13.0–17.0)
Immature Granulocytes: 1 %
LYMPHS ABS: 0.6 10*3/uL — AB (ref 0.7–4.0)
Lymphocytes Relative: 9 %
MCH: 25.5 pg — ABNORMAL LOW (ref 26.0–34.0)
MCHC: 31.3 g/dL (ref 30.0–36.0)
MCV: 81.4 fL (ref 80.0–100.0)
Monocytes Absolute: 0.6 10*3/uL (ref 0.1–1.0)
Monocytes Relative: 9 %
NEUTROS PCT: 79 %
NRBC: 0.3 % — AB (ref 0.0–0.2)
Neutro Abs: 5.4 10*3/uL (ref 1.7–7.7)
Platelet Count: 131 10*3/uL — ABNORMAL LOW (ref 150–400)
RBC: 4.04 MIL/uL — ABNORMAL LOW (ref 4.22–5.81)
RDW: 21.2 % — ABNORMAL HIGH (ref 11.5–15.5)
WBC Count: 6.7 10*3/uL (ref 4.0–10.5)

## 2018-03-14 MED ORDER — SODIUM CHLORIDE 0.9% FLUSH
10.0000 mL | INTRAVENOUS | Status: DC | PRN
  Administered 2018-03-14: 10 mL
  Filled 2018-03-14: qty 10

## 2018-03-14 MED ORDER — DEXAMETHASONE SODIUM PHOSPHATE 10 MG/ML IJ SOLN
INTRAMUSCULAR | Status: AC
  Filled 2018-03-14: qty 1

## 2018-03-14 MED ORDER — HEPARIN SOD (PORK) LOCK FLUSH 100 UNIT/ML IV SOLN
500.0000 [IU] | Freq: Once | INTRAVENOUS | Status: AC | PRN
  Administered 2018-03-14: 500 [IU]
  Filled 2018-03-14: qty 5

## 2018-03-14 MED ORDER — PALONOSETRON HCL INJECTION 0.25 MG/5ML
0.2500 mg | Freq: Once | INTRAVENOUS | Status: AC
  Administered 2018-03-14: 0.25 mg via INTRAVENOUS

## 2018-03-14 MED ORDER — DEXTROSE 5 % IV SOLN
29.0000 mg/m2 | Freq: Once | INTRAVENOUS | Status: AC
  Administered 2018-03-14: 60 mg via INTRAVENOUS
  Filled 2018-03-14: qty 30

## 2018-03-14 MED ORDER — SODIUM CHLORIDE 0.9 % IV SOLN
Freq: Once | INTRAVENOUS | Status: DC
  Filled 2018-03-14: qty 250

## 2018-03-14 MED ORDER — SODIUM CHLORIDE 0.9 % IV SOLN
INTRAVENOUS | Status: DC
  Administered 2018-03-14: 08:00:00 via INTRAVENOUS
  Filled 2018-03-14: qty 250

## 2018-03-14 MED ORDER — PALONOSETRON HCL INJECTION 0.25 MG/5ML
INTRAVENOUS | Status: AC
  Filled 2018-03-14: qty 5

## 2018-03-14 MED ORDER — SODIUM CHLORIDE 0.9 % IV SOLN
300.0000 mg/m2 | Freq: Once | INTRAVENOUS | Status: AC
  Administered 2018-03-14: 620 mg via INTRAVENOUS
  Filled 2018-03-14: qty 31

## 2018-03-14 MED ORDER — DEXAMETHASONE SODIUM PHOSPHATE 10 MG/ML IJ SOLN
10.0000 mg | Freq: Once | INTRAMUSCULAR | Status: AC
  Administered 2018-03-14: 10 mg via INTRAVENOUS

## 2018-03-14 MED ORDER — HYDROMORPHONE HCL 1 MG/ML IJ SOLN
INTRAMUSCULAR | Status: AC
  Filled 2018-03-14: qty 2

## 2018-03-14 MED ORDER — HYDROMORPHONE HCL 1 MG/ML IJ SOLN
2.0000 mg | Freq: Once | INTRAMUSCULAR | Status: AC
  Administered 2018-03-14: 2 mg via INTRAVENOUS

## 2018-03-14 NOTE — Patient Instructions (Signed)
Dexamethasone injection What is this medicine? DEXAMETHASONE (dex a METH a sone) is a corticosteroid. It is used to treat inflammation of the skin, joints, lungs, and other organs. Common conditions treated include asthma, allergies, and arthritis. It is also used for other conditions, like blood disorders and diseases of the adrenal glands. This medicine may be used for other purposes; ask your health care provider or pharmacist if you have questions. COMMON BRAND NAME(S): Decadron, DoubleDex, Simplist Dexamethasone, Solurex What should I tell my health care provider before I take this medicine? They need to know if you have any of these conditions: -blood clotting problems -Cushing's syndrome -diabetes -glaucoma -heart problems or disease -high blood pressure -infection like herpes, measles, tuberculosis, or chickenpox -kidney disease -liver disease -mental problems -myasthenia gravis -osteoporosis -previous heart attack -seizures -stomach, ulcer or intestine disease including colitis and diverticulitis -thyroid problem -an unusual or allergic reaction to dexamethasone, corticosteroids, other medicines, lactose, foods, dyes, or preservatives -pregnant or trying to get pregnant -breast-feeding How should I use this medicine? This medicine is for injection into a muscle, joint, lesion, soft tissue, or vein. It is given by a health care professional in a hospital or clinic setting. Talk to your pediatrician regarding the use of this medicine in children. Special care may be needed. Overdosage: If you think you have taken too much of this medicine contact a poison control center or emergency room at once. NOTE: This medicine is only for you. Do not share this medicine with others. What if I miss a dose? This may not apply. If you are having a series of injections over a prolonged period, try not to miss an appointment. Call your doctor or health care professional to reschedule if you  are unable to keep an appointment. What may interact with this medicine? Do not take this medicine with any of the following medications: -mifepristone, RU-486 -vaccines This medicine may also interact with the following medications: -amphotericin B -antibiotics like clarithromycin, erythromycin, and troleandomycin -aspirin and aspirin-like drugs -barbiturates like phenobarbital -carbamazepine -cholestyramine -cholinesterase inhibitors like donepezil, galantamine, rivastigmine, and tacrine -cyclosporine -digoxin -diuretics -ephedrine -male hormones, like estrogens or progestins and birth control pills -indinavir -isoniazid -ketoconazole -medicines for diabetes -medicines that improve muscle tone or strength for conditions like myasthenia gravis -NSAIDs, medicines for pain and inflammation, like ibuprofen or naproxen -phenytoin -rifampin -thalidomide -warfarin This list may not describe all possible interactions. Give your health care provider a list of all the medicines, herbs, non-prescription drugs, or dietary supplements you use. Also tell them if you smoke, drink alcohol, or use illegal drugs. Some items may interact with your medicine. What should I watch for while using this medicine? Your condition will be monitored carefully while you are receiving this medicine. If you are taking this medicine for a long time, carry an identification card with your name and address, the type and dose of your medicine, and your doctor's name and address. This medicine may increase your risk of getting an infection. Stay away from people who are sick. Tell your doctor or health care professional if you are around anyone with measles or chickenpox. Talk to your health care provider before you get any vaccines that you take this medicine. If you are going to have surgery, tell your doctor or health care professional that you have taken this medicine within the last twelve months. Ask your  doctor or health care professional about your diet. You may need to lower the amount of salt you eat. The  medicine can increase your blood sugar. If you are a diabetic check with your doctor if you need help adjusting the dose of your diabetic medicine. What side effects may I notice from receiving this medicine? Side effects that you should report to your doctor or health care professional as soon as possible: -allergic reactions like skin rash, itching or hives, swelling of the face, lips, or tongue -black or tarry stools -change in the amount of urine -changes in vision -confusion, excitement, restlessness, a false sense of well-being -fever, sore throat, sneezing, cough, or other signs of infection, wounds that will not heal -hallucinations -increased thirst -mental depression, mood swings, mistaken feelings of self importance or of being mistreated -pain in hips, back, ribs, arms, shoulders, or legs -pain, redness, or irritation at the injection site -redness, blistering, peeling or loosening of the skin, including inside the mouth -rounding out of face -swelling of feet or lower legs -unusual bleeding or bruising -unusual tired or weak -wounds that do not heal Side effects that usually do not require medical attention (report to your doctor or health care professional if they continue or are bothersome): -diarrhea or constipation -change in taste -headache -nausea, vomiting -skin problems, acne, thin and shiny skin -touble sleeping -unusual growth of hair on the face or body -weight gain This list may not describe all possible side effects. Call your doctor for medical advice about side effects. You may report side effects to FDA at 1-800-FDA-1088. Where should I keep my medicine? This drug is given in a hospital or clinic and will not be stored at home. NOTE: This sheet is a summary. It may not cover all possible information. If you have questions about this medicine, talk to  your doctor, pharmacist, or health care provider.  2019 Elsevier/[employer] Standard (2007-06-02 14:04:12) Palonosetron Injection What is this medicine? PALONOSETRON (pal oh NOE se tron) is used to prevent nausea and vomiting caused by chemotherapy. It also helps prevent delayed nausea and vomiting that may occur a few days after your treatment. This medicine may be used for other purposes; ask your health care provider or pharmacist if you have questions. COMMON BRAND NAME(S): Aloxi What should I tell my health care provider before I take this medicine? They need to know if you have any of these conditions: -an unusual or allergic reaction to palonosetron, dolasetron, granisetron, ondansetron, other medicines, foods, dyes, or preservatives -pregnant or trying to get pregnant -breast-feeding How should I use this medicine? This medicine is for infusion into a vein. It is given by a health care professional in a hospital or clinic setting. Talk to your pediatrician regarding the use of this medicine in children. While this drug may be prescribed for children as young as 1 month for selected conditions, precautions do apply. Overdosage: If you think you have taken too much of this medicine contact a poison control center or emergency room at once. NOTE: This medicine is only for you. Do not share this medicine with others. What if I miss a dose? This does not apply. What may interact with this medicine? -certain medicines for depression, anxiety, or psychotic disturbances -fentanyl -linezolid -MAOIs like Carbex, Eldepryl, Marplan, Nardil, and Parnate -methylene blue (injected into a vein) -tramadol This list may not describe all possible interactions. Give your health care provider a list of all the medicines, herbs, non-prescription drugs, or dietary supplements you use. Also tell them if you smoke, drink alcohol, or use illegal drugs. Some items may interact with your medicine.  What should I  watch for while using this medicine? Your condition will be monitored carefully while you are receiving this medicine. What side effects may I notice from receiving this medicine? Side effects that you should report to your doctor or health care professional as soon as possible: -allergic reactions like skin rash, itching or hives, swelling of the face, lips, or tongue -breathing problems -confusion -dizziness -fast, irregular heartbeat -fever and chills -loss of balance or coordination -seizures -sweating -swelling of the hands and feet -tremors -unusually weak or tired Side effects that usually do not require medical attention (report to your doctor or health care professional if they continue or are bothersome): -constipation or diarrhea -headache This list may not describe all possible side effects. Call your doctor for medical advice about side effects. You may report side effects to FDA at 1-800-FDA-1088. Where should I keep my medicine? This drug is given in a hospital or clinic and will not be stored at home. NOTE: This sheet is a summary. It may not cover all possible information. If you have questions about this medicine, talk to your doctor, pharmacist, or health care provider.  2019 Elsevier/Gold Standard (2012-12-16 10:38:36) Cyclophosphamide injection What is this medicine? CYCLOPHOSPHAMIDE (sye kloe FOSS fa mide) is a chemotherapy drug. It slows the growth of cancer cells. This medicine is used to treat many types of cancer like lymphoma, myeloma, leukemia, breast cancer, and ovarian cancer, to name a few. This medicine may be used for other purposes; ask your health care provider or pharmacist if you have questions. COMMON BRAND NAME(S): Cytoxan, Neosar What should I tell my health care provider before I take this medicine? They need to know if you have any of these conditions: -blood disorders -history of other chemotherapy -infection -kidney disease -liver  disease -recent or ongoing radiation therapy -tumors in the bone marrow -an unusual or allergic reaction to cyclophosphamide, other chemotherapy, other medicines, foods, dyes, or preservatives -pregnant or trying to get pregnant -breast-feeding How should I use this medicine? This drug is usually given as an injection into a vein or muscle or by infusion into a vein. It is administered in a hospital or clinic by a specially trained health care professional. Talk to your pediatrician regarding the use of this medicine in children. Special care may be needed. Overdosage: If you think you have taken too much of this medicine contact a poison control center or emergency room at once. NOTE: This medicine is only for you. Do not share this medicine with others. What if I miss a dose? It is important not to miss your dose. Call your doctor or health care professional if you are unable to keep an appointment. What may interact with this medicine? This medicine may interact with the following medications: -amiodarone -amphotericin B -azathioprine -certain antiviral medicines for HIV or AIDS such as protease inhibitors (e.g., indinavir, ritonavir) and zidovudine -certain blood pressure medications such as benazepril, captopril, enalapril, fosinopril, lisinopril, moexipril, monopril, perindopril, quinapril, ramipril, trandolapril -certain cancer medications such as anthracyclines (e.g., daunorubicin, doxorubicin), busulfan, cytarabine, paclitaxel, pentostatin, tamoxifen, trastuzumab -certain diuretics such as chlorothiazide, chlorthalidone, hydrochlorothiazide, indapamide, metolazone -certain medicines that treat or prevent blood clots like warfarin -certain muscle relaxants such as succinylcholine -cyclosporine -etanercept -indomethacin -medicines to increase blood counts like filgrastim, pegfilgrastim, sargramostim -medicines used as general anesthesia -metronidazole -natalizumab This list may  not describe all possible interactions. Give your health care provider a list of all the medicines, herbs, non-prescription drugs, or dietary supplements you  use. Also tell them if you smoke, drink alcohol, or use illegal drugs. Some items may interact with your medicine. What should I watch for while using this medicine? Visit your doctor for checks on your progress. This drug may make you feel generally unwell. This is not uncommon, as chemotherapy can affect healthy cells as well as cancer cells. Report any side effects. Continue your course of treatment even though you feel ill unless your doctor tells you to stop. Drink water or other fluids as directed. Urinate often, even at night. In some cases, you may be given additional medicines to help with side effects. Follow all directions for their use. Call your doctor or health care professional for advice if you get a fever, chills or sore throat, or other symptoms of a cold or flu. Do not treat yourself. This drug decreases your body's ability to fight infections. Try to avoid being around people who are sick. This medicine may increase your risk to bruise or bleed. Call your doctor or health care professional if you notice any unusual bleeding. Be careful brushing and flossing your teeth or using a toothpick because you may get an infection or bleed more easily. If you have any dental work done, tell your dentist you are receiving this medicine. You may get drowsy or dizzy. Do not drive, use machinery, or do anything that needs mental alertness until you know how this medicine affects you. Do not become pregnant while taking this medicine or for 1 year after stopping it. Women should inform their doctor if they wish to become pregnant or think they might be pregnant. Men should not father a child while taking this medicine and for 4 months after stopping it. There is a potential for serious side effects to an unborn child. Talk to your health care  professional or pharmacist for more information. Do not breast-feed an infant while taking this medicine. This medicine may interfere with the ability to have a child. This medicine has caused ovarian failure in some women. This medicine has caused reduced sperm counts in some men. You should talk with your doctor or health care professional if you are concerned about your fertility. If you are going to have surgery, tell your doctor or health care professional that you have taken this medicine. What side effects may I notice from receiving this medicine? Side effects that you should report to your doctor or health care professional as soon as possible: -allergic reactions like skin rash, itching or hives, swelling of the face, lips, or tongue -low blood counts - this medicine may decrease the number of white blood cells, red blood cells and platelets. You may be at increased risk for infections and bleeding. -signs of infection - fever or chills, cough, sore throat, pain or difficulty passing urine -signs of decreased platelets or bleeding - bruising, pinpoint red spots on the skin, black, tarry stools, blood in the urine -signs of decreased red blood cells - unusually weak or tired, fainting spells, lightheadedness -breathing problems -dark urine -dizziness -palpitations -swelling of the ankles, feet, hands -trouble passing urine or change in the amount of urine -weight gain -yellowing of the eyes or skin Side effects that usually do not require medical attention (report to your doctor or health care professional if they continue or are bothersome): -changes in nail or skin color -hair loss -missed menstrual periods -mouth sores -nausea, vomiting This list may not describe all possible side effects. Call your doctor for medical advice about  side effects. You may report side effects to FDA at 1-800-FDA-1088. Where should I keep my medicine? This drug is given in a hospital or clinic and  will not be stored at home. NOTE: This sheet is a summary. It may not cover all possible information. If you have questions about this medicine, talk to your doctor, pharmacist, or health care provider.  2019 Elsevier/Gold Standard (2011-12-25 16:22:58) Carfilzomib injection What is this medicine? CARFILZOMIB (kar FILZ oh mib) targets a specific protein within cancer cells and stops the cancer cells from growing. It is used to treat multiple myeloma. This medicine may be used for other purposes; ask your health care provider or pharmacist if you have questions. COMMON BRAND NAME(S): KYPROLIS What should I tell my health care provider before I take this medicine? They need to know if you have any of these conditions: -heart disease -history of blood clots -irregular heartbeat -kidney disease -liver disease -lung or breathing disease -an unusual or allergic reaction to carfilzomib, or other medicines, foods, dyes, or preservatives -pregnant or trying to get pregnant -breast-feeding How should I use this medicine? This medicine is for injection or infusion into a vein. It is given by a health care professional in a hospital or clinic setting. Talk to your pediatrician regarding the use of this medicine in children. Special care may be needed. Overdosage: If you think you have taken too much of this medicine contact a poison control center or emergency room at once. NOTE: This medicine is only for you. Do not share this medicine with others. What if I miss a dose? It is important not to miss your dose. Call your doctor or health care professional if you are unable to keep an appointment. What may interact with this medicine? Interactions are not expected. Give your health care provider a list of all the medicines, herbs, non-prescription drugs, or dietary supplements you use. Also tell them if you smoke, drink alcohol, or use illegal drugs. Some items may interact with your medicine. This  list may not describe all possible interactions. Give your health care provider a list of all the medicines, herbs, non-prescription drugs, or dietary supplements you use. Also tell them if you smoke, drink alcohol, or use illegal drugs. Some items may interact with your medicine. What should I watch for while using this medicine? Your condition will be monitored carefully while you are receiving this medicine. Report any side effects. Continue your course of treatment even though you feel ill unless your doctor tells you to stop. You may need blood work done while you are taking this medicine. Do not become pregnant while taking this medicine or for at least 6 months after stopping it. Women should inform their doctor if they wish to become pregnant or think they might be pregnant. There is a potential for serious side effects to an unborn child. Men should not father a child while taking this medicine and for at least 3 months after stopping it. Talk to your health care professional or pharmacist for more information. Do not breast-feed an infant while taking this medicine or for 2 weeks after the last dose. Check with your doctor or health care professional if you get an attack of severe diarrhea, nausea and vomiting, or if you sweat a lot. The loss of too much body fluid can make it dangerous for you to take this medicine. You may get dizzy. Do not drive, use machinery, or do anything that needs mental alertness until you know  how this medicine affects you. Do not stand or sit up quickly, especially if you are an older patient. This reduces the risk of dizzy or fainting spells. What side effects may I notice from receiving this medicine? Side effects that you should report to your doctor or health care professional as soon as possible: -allergic reactions like skin rash, itching or hives, swelling of the face, lips, or tongue -confusion -dizziness -feeling faint or lightheaded -fever or  chills -palpitations -seizures -signs and symptoms of bleeding such as bloody or black, tarry stools; red or dark-brown urine; spitting up blood or brown material that looks like coffee grounds; red spots on the skin; unusual bruising or bleeding including from the eye, gums, or nose -signs and symptoms of a blood clot such as breathing problems; changes in vision; chest pain; severe, sudden headache; pain, swelling, warmth in the leg; trouble speaking; sudden numbness or weakness of the face, arm or leg -signs and symptoms of kidney injury like trouble passing urine or change in the amount of urine -signs and symptoms of liver injury like dark yellow or brown urine; general ill feeling or flu-like symptoms; light-colored stools; loss of appetite; nausea; right upper belly pain; unusually weak or tired; yellowing of the eyes or skin Side effects that usually do not require medical attention (report to your doctor or health care professional if they continue or are bothersome): -back pain -cough -diarrhea -headache -muscle cramps -vomiting This list may not describe all possible side effects. Call your doctor for medical advice about side effects. You may report side effects to FDA at 1-800-FDA-1088. Where should I keep my medicine? This drug is given in a hospital or clinic and will not be stored at home. NOTE: This sheet is a summary. It may not cover all possible information. If you have questions about this medicine, talk to your doctor, pharmacist, or health care provider.  2019 Elsevier/[employer] Standard (2016-11-25 14:07:13)

## 2018-03-14 NOTE — Patient Instructions (Signed)

## 2018-03-14 NOTE — Progress Notes (Signed)
Okay to treat with SCr = 2.03 today per Sheryn Bison, NP.

## 2018-03-20 ENCOUNTER — Other Ambulatory Visit: Payer: Self-pay | Admitting: Family

## 2018-03-20 DIAGNOSIS — R109 Unspecified abdominal pain: Secondary | ICD-10-CM

## 2018-03-28 ENCOUNTER — Inpatient Hospital Stay: Payer: Medicare Other

## 2018-03-28 ENCOUNTER — Inpatient Hospital Stay: Payer: Medicare Other | Attending: Hematology & Oncology | Admitting: Hematology & Oncology

## 2018-03-28 ENCOUNTER — Encounter: Payer: Self-pay | Admitting: Hematology & Oncology

## 2018-03-28 ENCOUNTER — Other Ambulatory Visit: Payer: Self-pay

## 2018-03-28 ENCOUNTER — Ambulatory Visit: Payer: Self-pay | Admitting: Pharmacist

## 2018-03-28 VITALS — BP 135/67 | HR 80 | Temp 98.5°F | Resp 18 | Wt 195.0 lb

## 2018-03-28 DIAGNOSIS — C9002 Multiple myeloma in relapse: Secondary | ICD-10-CM

## 2018-03-28 DIAGNOSIS — Z5111 Encounter for antineoplastic chemotherapy: Secondary | ICD-10-CM | POA: Insufficient documentation

## 2018-03-28 DIAGNOSIS — N189 Chronic kidney disease, unspecified: Secondary | ICD-10-CM | POA: Diagnosis present

## 2018-03-28 DIAGNOSIS — D631 Anemia in chronic kidney disease: Secondary | ICD-10-CM | POA: Insufficient documentation

## 2018-03-28 DIAGNOSIS — Z299 Encounter for prophylactic measures, unspecified: Secondary | ICD-10-CM

## 2018-03-28 DIAGNOSIS — Z5112 Encounter for antineoplastic immunotherapy: Secondary | ICD-10-CM | POA: Diagnosis present

## 2018-03-28 DIAGNOSIS — E349 Endocrine disorder, unspecified: Secondary | ICD-10-CM

## 2018-03-28 DIAGNOSIS — M797 Fibromyalgia: Secondary | ICD-10-CM | POA: Insufficient documentation

## 2018-03-28 DIAGNOSIS — C9000 Multiple myeloma not having achieved remission: Secondary | ICD-10-CM | POA: Diagnosis present

## 2018-03-28 DIAGNOSIS — M4712 Other spondylosis with myelopathy, cervical region: Secondary | ICD-10-CM

## 2018-03-28 DIAGNOSIS — D508 Other iron deficiency anemias: Secondary | ICD-10-CM

## 2018-03-28 DIAGNOSIS — D509 Iron deficiency anemia, unspecified: Secondary | ICD-10-CM

## 2018-03-28 LAB — CMP (CANCER CENTER ONLY)
ALT: 12 U/L (ref 0–44)
AST: 15 U/L (ref 15–41)
Albumin: 4.4 g/dL (ref 3.5–5.0)
Alkaline Phosphatase: 57 U/L (ref 38–126)
Anion gap: 8 (ref 5–15)
BILIRUBIN TOTAL: 0.3 mg/dL (ref 0.3–1.2)
BUN: 19 mg/dL (ref 8–23)
CALCIUM: 9.8 mg/dL (ref 8.9–10.3)
CO2: 27 mmol/L (ref 22–32)
Chloride: 104 mmol/L (ref 98–111)
Creatinine: 1.93 mg/dL — ABNORMAL HIGH (ref 0.61–1.24)
GFR, Est AFR Am: 40 mL/min — ABNORMAL LOW (ref >60)
GFR, Estimated: 35 mL/min — ABNORMAL LOW (ref >60)
Glucose, Bld: 145 mg/dL — ABNORMAL HIGH (ref 70–99)
Potassium: 4.1 mmol/L (ref 3.5–5.1)
Sodium: 139 mmol/L (ref 135–145)
Total Protein: 6.8 g/dL (ref 6.5–8.1)

## 2018-03-28 LAB — CBC WITH DIFFERENTIAL (CANCER CENTER ONLY)
Abs Immature Granulocytes: 0.07 10*3/uL (ref 0.00–0.07)
BASOS ABS: 0 10*3/uL (ref 0.0–0.1)
Basophils Relative: 1 %
EOS PCT: 2 %
Eosinophils Absolute: 0.1 10*3/uL (ref 0.0–0.5)
HEMATOCRIT: 32.4 % — AB (ref 39.0–52.0)
Hemoglobin: 10.1 g/dL — ABNORMAL LOW (ref 13.0–17.0)
Immature Granulocytes: 2 %
LYMPHS ABS: 0.7 10*3/uL (ref 0.7–4.0)
Lymphocytes Relative: 18 %
MCH: 25.5 pg — ABNORMAL LOW (ref 26.0–34.0)
MCHC: 31.2 g/dL (ref 30.0–36.0)
MCV: 81.8 fL (ref 80.0–100.0)
Monocytes Absolute: 0.4 10*3/uL (ref 0.1–1.0)
Monocytes Relative: 11 %
Neutro Abs: 2.5 10*3/uL (ref 1.7–7.7)
Neutrophils Relative %: 66 %
Platelet Count: 173 10*3/uL (ref 150–400)
RBC: 3.96 MIL/uL — ABNORMAL LOW (ref 4.22–5.81)
RDW: 19.7 % — ABNORMAL HIGH (ref 11.5–15.5)
WBC Count: 3.7 10*3/uL — ABNORMAL LOW (ref 4.0–10.5)
nRBC: 0 % (ref 0.0–0.2)

## 2018-03-28 LAB — RETICULOCYTES
Immature Retic Fract: 9.5 % (ref 2.3–15.9)
RBC.: 3.96 MIL/uL — ABNORMAL LOW (ref 4.22–5.81)
Retic Count, Absolute: 26.1 10*3/uL (ref 19.0–186.0)
Retic Ct Pct: 0.7 % (ref 0.4–3.1)

## 2018-03-28 LAB — IRON AND TIBC
Iron: 60 ug/dL (ref 42–163)
Saturation Ratios: 25 % (ref 20–55)
TIBC: 239 ug/dL (ref 202–409)
UIBC: 179 ug/dL (ref 117–376)

## 2018-03-28 LAB — FERRITIN: Ferritin: 1074 ng/mL — ABNORMAL HIGH (ref 24–336)

## 2018-03-28 MED ORDER — SODIUM CHLORIDE 0.9 % IV SOLN
Freq: Once | INTRAVENOUS | Status: AC
  Administered 2018-03-28: 09:00:00 via INTRAVENOUS
  Filled 2018-03-28: qty 250

## 2018-03-28 MED ORDER — DEXTROSE 5 % IV SOLN
29.0000 mg/m2 | Freq: Once | INTRAVENOUS | Status: AC
  Administered 2018-03-28: 60 mg via INTRAVENOUS
  Filled 2018-03-28: qty 30

## 2018-03-28 MED ORDER — SODIUM CHLORIDE 0.9% FLUSH
10.0000 mL | INTRAVENOUS | Status: DC | PRN
  Administered 2018-03-28: 10 mL
  Filled 2018-03-28: qty 10

## 2018-03-28 MED ORDER — HYDROMORPHONE HCL 1 MG/ML IJ SOLN
INTRAMUSCULAR | Status: AC
  Filled 2018-03-28: qty 2

## 2018-03-28 MED ORDER — SODIUM CHLORIDE 0.9 % IV SOLN
Freq: Once | INTRAVENOUS | Status: AC
  Filled 2018-03-28: qty 250

## 2018-03-28 MED ORDER — SODIUM CHLORIDE 0.9 % IV SOLN
300.0000 mg/m2 | Freq: Once | INTRAVENOUS | Status: AC
  Administered 2018-03-28: 620 mg via INTRAVENOUS
  Filled 2018-03-28: qty 31

## 2018-03-28 MED ORDER — HEPARIN SOD (PORK) LOCK FLUSH 100 UNIT/ML IV SOLN
500.0000 [IU] | Freq: Once | INTRAVENOUS | Status: AC | PRN
  Administered 2018-03-28: 500 [IU]
  Filled 2018-03-28: qty 5

## 2018-03-28 MED ORDER — PALONOSETRON HCL INJECTION 0.25 MG/5ML
0.2500 mg | Freq: Once | INTRAVENOUS | Status: AC
  Administered 2018-03-28: 0.25 mg via INTRAVENOUS

## 2018-03-28 MED ORDER — DEXAMETHASONE SODIUM PHOSPHATE 10 MG/ML IJ SOLN
10.0000 mg | Freq: Once | INTRAMUSCULAR | Status: AC
  Administered 2018-03-28: 10 mg via INTRAVENOUS

## 2018-03-28 MED ORDER — PALONOSETRON HCL INJECTION 0.25 MG/5ML
INTRAVENOUS | Status: AC
  Filled 2018-03-28: qty 5

## 2018-03-28 MED ORDER — SODIUM CHLORIDE 0.9 % IV SOLN
60.0000 mg | Freq: Once | INTRAVENOUS | Status: AC
  Administered 2018-03-28: 60 mg via INTRAVENOUS
  Filled 2018-03-28: qty 20
  Filled 2018-03-28: qty 6.67

## 2018-03-28 MED ORDER — DEXAMETHASONE SODIUM PHOSPHATE 10 MG/ML IJ SOLN
INTRAMUSCULAR | Status: AC
  Filled 2018-03-28: qty 1

## 2018-03-28 MED ORDER — HYDROMORPHONE HCL 1 MG/ML IJ SOLN
2.0000 mg | Freq: Once | INTRAMUSCULAR | Status: AC
  Administered 2018-03-28: 2 mg via INTRAVENOUS

## 2018-03-28 NOTE — Progress Notes (Signed)
Hematology and Oncology Follow Up Visit  [name] [id number] Jun 29, 1950 68 y.o. 03/28/2018   Principle Diagnosis:  IgG kappa myeloma Anemia secondary to renal insufficiency Intermittent iron - deficiency anemia Hypotestosteronemia  Current Therapy:   Aredia 60 mg IV q 3 months-- next dose 06/2018 Aranesp 300 mcg subcu as needed for hemoglobin less than 11 DepoTestosterone400 mg q 3 weeks IV iron as indicated Kyprolis/Cytoxan - s/p cycle#15   Interim History:  [name] is here today for follow-up and treatment.  He is doing pretty well.  As always, he and I talked about the [place].  As always, he and I have different opinions about the outcome.  With respect to his myeloma, this is holding pretty steady.  His last M spike was 0.3 g/dL.  His IgG level was 987 mg/dL.  His kappa light chain was 2.1 mg/dL.  He is mostly bothered by his fibromyalgia.  He is on medication for this.  He has low testosterone.  His supplemental testosterone has really helped him out.  His last testosterone level was over 1200.  He has had no issues with his heart.  He has had no change in bowel or bladder habits.  He has had no rashes.  His iron studies that were done a month ago showed a ferritin of 1260 with an iron saturation of 32%.  His ferritin elevation is from his fibromyalgia.    Overall, his performance status is ECOG 1.  Medications:  Allergies as of 03/28/2018      Reactions   Iodinated Diagnostic Agents Rash   Patient states he was instructed not to take IV contrast.  In 2008 he had an unknown reaction, and was told not to take it again.  He was also told not to take it due to his kidneys.   Iodine Anxiety, Rash, Other (See Comments)   Didn't feel right "instructed not to take per MD--something with his port"      Medication List       Accurate as of March 28, 2018  8:41 AM. Always use your most recent med list.        amLODipine 10 MG tablet Commonly known as:   NORVASC Take 10 mg by mouth daily.   CARAFATE 1 GM/10ML suspension Generic drug:  sucralfate Take 1 g by mouth 4 (four) times daily.   cefdinir 300 MG capsule Commonly known as:  OMNICEF Take 1 capsule (300 mg total) by mouth 2 (two) times daily.   ciprofloxacin 500 MG tablet Commonly known as:  CIPRO Take 1 tablet (500 mg total) by mouth every 12 (twelve) hours.   CLEAR EYES ALL SEASONS 5-6 MG/ML Soln Generic drug:  Polyvinyl Alcohol-Povidone Place 2 drops into both eyes 3 (three) times daily as needed (drye eyes.).   cyclobenzaprine 10 MG tablet Commonly known as:  FLEXERIL Take 10 mg by mouth 3 (three) times daily.   dicyclomine 10 MG capsule Commonly known as:  BENTYL TAKE ONE CAPSULE BY MOUTH 3 TIMES A DAY   dronabinol 2.5 MG capsule Commonly known as:  MARINOL Take 1 capsule (2.5 mg total) by mouth 2 (two) times daily before a meal.   EMBEDA 30-1.2 MG Cpcr Generic drug:  Morphine-Naltrexone Take 3 (three) times daily by mouth.   EMBEDA 20-0.8 MG Cpcr Generic drug:  Morphine-Naltrexone Take 1 capsule by mouth 3 (three) times daily.   furosemide 40 MG tablet Commonly known as:  LASIX Take 40 mg by mouth.   glipiZIDE 5  MG tablet Commonly known as:  GLUCOTROL Take 5 mg by mouth 2 (two) times daily before a meal.   HYDROcodone-acetaminophen 5-325 MG tablet Commonly known as:  NORCO Take 1-2 tablets by mouth every 6 (six) hours as needed.   latanoprost 0.005 % ophthalmic solution Commonly known as:  XALATAN Place 1 drop into the right eye every evening.   lidocaine-prilocaine cream Commonly known as:  EMLA Apply 1 application topically as needed.   linaclotide 145 MCG Caps capsule Commonly known as:  LINZESS Take 1 capsule (145 mcg total) by mouth daily before breakfast.   LORazepam 0.5 MG tablet Commonly known as:  ATIVAN Take 1 tablet (0.5 mg total) by mouth every 6 (six) hours as needed (Nausea or vomiting).   losartan 25 MG tablet Commonly known  as:  COZAAR Take 25 mg by mouth.   metoprolol succinate 100 MG 24 hr tablet Commonly known as:  TOPROL-XL Take 100 mg by mouth daily.   mirabegron ER 50 MG Tb24 tablet Commonly known as:  MYRBETRIQ Take 50 mg by mouth daily.   omeprazole 40 MG capsule Commonly known as:  PRILOSEC TAKE 1 CAPSULE BY MOUTH TWICE A DAY   ondansetron 8 MG tablet Commonly known as:  ZOFRAN TAKE 1 TABLET(8 MG) BY MOUTH TWICE DAILY AS NEEDED FOR NAUSEA OR VOMITING   orphenadrine 100 MG tablet Commonly known as:  NORFLEX Take 1 tablet (100 mg total) by mouth 2 (two) times daily.   oxyCODONE-acetaminophen 10-325 MG tablet Commonly known as:  PERCOCET Take 1 tablet by mouth every 6 (six) hours as needed for pain.   pioglitazone 30 MG tablet Commonly known as:  ACTOS Take 30 mg by mouth daily.   potassium chloride SA 20 MEQ tablet Commonly known as:  K-DUR,KLOR-CON Take 1 tablet (20 mEq total) by mouth 2 (two) times daily.   prochlorperazine 10 MG tablet Commonly known as:  COMPAZINE take 1 tablet by mouth every 6 hours if needed for nausea and vomiting   RELISTOR 150 MG Tabs Generic drug:  Methylnaltrexone Bromide Take 150 mg by mouth.   Sennosides 25 MG Tabs Take 25 mg by mouth daily as needed (constipation).   Vitamin D (Ergocalciferol) 1.25 MG (50000 UT) Caps capsule Commonly known as:  DRISDOL Take 50,000 Units by mouth every Saturday.   ZIOPTAN 0.0015 % Soln Generic drug:  Tafluprost (PF) Place 1 drop into both eyes daily.       Allergies:  Allergies  Allergen Reactions  . Iodinated Diagnostic Agents Rash    Patient states he was instructed not to take IV contrast.  In 2008 he had an unknown reaction, and was told not to take it again.  He was also told not to take it due to his kidneys.  . Iodine Anxiety, Rash and Other (See Comments)    Didn't feel right "instructed not to take per MD--something with his port"     Past Medical History, Surgical history, Social history,  and Family History were reviewed and updated.  Review of Systems: Review of Systems  Constitutional: Negative.   HENT: Negative.   Eyes: Negative.   Respiratory: Negative.   Cardiovascular: Negative.   Gastrointestinal: Negative.   Genitourinary: Negative.   Musculoskeletal: Positive for back pain, joint pain and myalgias.  Skin: Negative.   Neurological: Negative.   Endo/Heme/Allergies: Negative.   Psychiatric/Behavioral: Negative.      Physical Exam:  weight is 195 lb (88.5 kg). His oral temperature is 98.5 F (36.9 C). His  blood pressure is 135/67 and his pulse is 80. His respiration is 18 and oxygen saturation is 100%.   Wt Readings from Last 3 Encounters:  03/28/18 195 lb (88.5 kg)  02/28/18 197 lb 12 oz (89.7 kg)  02/03/18 198 lb (89.8 kg)    Physical Exam Vitals signs reviewed.  HENT:     Head: Normocephalic and atraumatic.  Eyes:     Pupils: Pupils are equal, round, and reactive to light.  Neck:     Musculoskeletal: Normal range of motion.  Cardiovascular:     Rate and Rhythm: Normal rate and regular rhythm.     Heart sounds: Normal heart sounds.  Pulmonary:     Effort: Pulmonary effort is normal.     Breath sounds: Normal breath sounds.  Abdominal:     General: Bowel sounds are normal.     Palpations: Abdomen is soft.  Musculoskeletal: Normal range of motion.        General: No tenderness or deformity.  Lymphadenopathy:     Cervical: No cervical adenopathy.  Skin:    General: Skin is warm and dry.     Findings: No erythema or rash.  Neurological:     Mental Status: He is alert and oriented to person, place, and time.  Psychiatric:        Behavior: Behavior normal.        Thought Content: Thought content normal.        Judgment: Judgment normal.      Lab Results  Component Value Date   WBC 3.7 (L) 03/28/2018   HGB 10.1 (L) 03/28/2018   HCT 32.4 (L) 03/28/2018   MCV 81.8 03/28/2018   PLT 173 03/28/2018   Lab Results  Component Value Date     FERRITIN 1,260 (H) 02/28/2018   IRON 83 02/28/2018   TIBC 260 02/28/2018   UIBC 177 02/28/2018   IRONPCTSAT 32 02/28/2018   Lab Results  Component Value Date   RETICCTPCT 0.7 03/28/2018   RBC 3.96 (L) 03/28/2018   RBC 3.96 (L) 03/28/2018   RETICCTABS 27.5 03/21/2014   Lab Results  Component Value Date   KPAFRELGTCHN 20.8 (H) 02/28/2018   LAMBDASER 15.1 02/28/2018   KAPLAMBRATIO 1.38 02/28/2018   Lab Results  Component Value Date   IGGSERUM 987 02/28/2018   IGA 170 02/28/2018   IGMSERUM 22 02/28/2018   Lab Results  Component Value Date   TOTALPROTELP 6.8 02/28/2018   ALBUMINELP 3.6 02/28/2018   A1GS 0.2 02/28/2018   A2GS 1.1 (H) 02/28/2018   BETS 0.9 02/28/2018   BETA2SER 0.5 11/07/2014   GAMS 0.9 02/28/2018   MSPIKE 0.3 (H) 02/28/2018   SPEI Comment 02/28/2018     Chemistry      Component Value Date/Time   NA 135 03/14/2018 0800   NA 143 02/22/2017 0744   K 4.8 03/14/2018 0800   K 4.0 02/22/2017 0744   CL 103 03/14/2018 0800   CL 106 02/22/2017 0744   CO2 25 03/14/2018 0800   CO2 27 02/22/2017 0744   BUN 24 (H) 03/14/2018 0800   BUN 26 (H) 02/22/2017 0744   CREATININE 2.03 (H) 03/14/2018 0800   CREATININE 2.3 (H) 02/22/2017 0744      Component Value Date/Time   CALCIUM 9.2 03/14/2018 0800   CALCIUM 9.3 02/22/2017 0744   ALKPHOS 57 03/14/2018 0800   ALKPHOS 80 02/22/2017 0744   AST 10 (L) 03/14/2018 0800   ALT 10 03/14/2018 0800   ALT 22 02/22/2017 0744  BILITOT 0.5 03/14/2018 0800      Impression and Plan: Ms. Boomer is a very pleasant 68 yo African American gentleman with IgG kappa myeloma.   His renal function is doing quite well.  I think this is a good indicator that his myeloma is also doing well.  As such, we will continue him on his protocol.  I think he really has done well with the Kyprolis/Cytoxan.  We will proceed with his 16th cycle of treatment.  Again, his quality of life is our biggest priority.    We will have him come back  in another month.   Volanda Napoleon, MD 2/3/20208:41 AM

## 2018-03-28 NOTE — Patient Instructions (Signed)
Pamidronate injection What is this medicine? PAMIDRONATE (pa mi DROE nate) slows calcium loss from bones. It is used to treat high calcium blood levels from cancer or Paget's disease. It is also used to treat bone pain and prevent fractures from certain cancers that have spread to the bone. This medicine may be used for other purposes; ask your health care provider or pharmacist if you have questions. COMMON BRAND NAME(S): Aredia What should I tell my health care provider before I take this medicine? They need to know if you have any of these conditions: -aspirin-sensitive asthma -dental disease -kidney disease -an unusual or allergic reaction to pamidronate, other medicines, foods, dyes, or preservatives -pregnant or trying to get pregnant -breast-feeding How should I use this medicine? This medicine is for infusion into a vein. It is given by a health care professional in a hospital or clinic setting. Talk to your pediatrician regarding the use of this medicine in children. This medicine is not approved for use in children. Overdosage: If you think you have taken too much of this medicine contact a poison control center or emergency room at once. NOTE: This medicine is only for you. Do not share this medicine with others. What if I miss a dose? This does not apply. What may interact with this medicine? -certain antibiotics given by injection -medicines for inflammation or pain like ibuprofen, naproxen -some diuretics like bumetanide, furosemide -cyclosporine -parathyroid hormone -tacrolimus -teriparatide -thalidomide This list may not describe all possible interactions. Give your health care provider a list of all the medicines, herbs, non-prescription drugs, or dietary supplements you use. Also tell them if you smoke, drink alcohol, or use illegal drugs. Some items may interact with your medicine. What should I watch for while using this medicine? Visit your doctor or health care  professional for regular checkups. It may be some time before you see the benefit from this medicine. Do not stop taking your medicine unless your doctor tells you to. Your doctor may order blood tests or other tests to see how you are doing. Women should inform their doctor if they wish to become pregnant or think they might be pregnant. There is a potential for serious side effects to an unborn child. Talk to your health care professional or pharmacist for more information. You should make sure that you get enough calcium and vitamin D while you are taking this medicine. Discuss the foods you eat and the vitamins you take with your health care professional. Some people who take this medicine have severe bone, joint, and/or muscle pain. This medicine may also increase your risk for a broken thigh bone. Tell your doctor right away if you have pain in your upper leg or groin. Tell your doctor if you have any pain that does not go away or that gets worse. What side effects may I notice from receiving this medicine? Side effects that you should report to your doctor or health care professional as soon as possible: -allergic reactions like skin rash, itching or hives, swelling of the face, lips, or tongue -black or tarry stools -changes in vision -eye inflammation, pain -high blood pressure -jaw pain, especially burning or cramping -muscle weakness -numb, tingling pain -swelling of feet or hands -trouble passing urine or change in the amount of urine -unable to move easily Side effects that usually do not require medical attention (report to your doctor or health care professional if they continue or are bothersome): -bone, joint, or muscle pain -constipation -dizzy, drowsy -  fever -headache -loss of appetite -nausea, vomiting -pain at site where injected This list may not describe all possible side effects. Call your doctor for medical advice about side effects. You may report side effects to  FDA at 1-800-FDA-1088. Where should I keep my medicine? This drug is given in a hospital or clinic and will not be stored at home. NOTE: This sheet is a summary. It may not cover all possible information. If you have questions about this medicine, talk to your doctor, pharmacist, or health care provider.  2019 Elsevier/Gold Standard (2010-08-08 08:49:49) Cyclophosphamide injection What is this medicine? CYCLOPHOSPHAMIDE (sye kloe FOSS fa mide) is a chemotherapy drug. It slows the growth of cancer cells. This medicine is used to treat many types of cancer like lymphoma, myeloma, leukemia, breast cancer, and ovarian cancer, to name a few. This medicine may be used for other purposes; ask your health care provider or pharmacist if you have questions. COMMON BRAND NAME(S): Cytoxan, Neosar What should I tell my health care provider before I take this medicine? They need to know if you have any of these conditions: -blood disorders -history of other chemotherapy -infection -kidney disease -liver disease -recent or ongoing radiation therapy -tumors in the bone marrow -an unusual or allergic reaction to cyclophosphamide, other chemotherapy, other medicines, foods, dyes, or preservatives -pregnant or trying to get pregnant -breast-feeding How should I use this medicine? This drug is usually given as an injection into a vein or muscle or by infusion into a vein. It is administered in a hospital or clinic by a specially trained health care professional. Talk to your pediatrician regarding the use of this medicine in children. Special care may be needed. Overdosage: If you think you have taken too much of this medicine contact a poison control center or emergency room at once. NOTE: This medicine is only for you. Do not share this medicine with others. What if I miss a dose? It is important not to miss your dose. Call your doctor or health care professional if you are unable to keep an  appointment. What may interact with this medicine? This medicine may interact with the following medications: -amiodarone -amphotericin B -azathioprine -certain antiviral medicines for HIV or AIDS such as protease inhibitors (e.g., indinavir, ritonavir) and zidovudine -certain blood pressure medications such as benazepril, captopril, enalapril, fosinopril, lisinopril, moexipril, monopril, perindopril, quinapril, ramipril, trandolapril -certain cancer medications such as anthracyclines (e.g., daunorubicin, doxorubicin), busulfan, cytarabine, paclitaxel, pentostatin, tamoxifen, trastuzumab -certain diuretics such as chlorothiazide, chlorthalidone, hydrochlorothiazide, indapamide, metolazone -certain medicines that treat or prevent blood clots like warfarin -certain muscle relaxants such as succinylcholine -cyclosporine -etanercept -indomethacin -medicines to increase blood counts like filgrastim, pegfilgrastim, sargramostim -medicines used as general anesthesia -metronidazole -natalizumab This list may not describe all possible interactions. Give your health care provider a list of all the medicines, herbs, non-prescription drugs, or dietary supplements you use. Also tell them if you smoke, drink alcohol, or use illegal drugs. Some items may interact with your medicine. What should I watch for while using this medicine? Visit your doctor for checks on your progress. This drug may make you feel generally unwell. This is not uncommon, as chemotherapy can affect healthy cells as well as cancer cells. Report any side effects. Continue your course of treatment even though you feel ill unless your doctor tells you to stop. Drink water or other fluids as directed. Urinate often, even at night. In some cases, you may be given additional medicines to help with side effects.   Follow all directions for their use. Call your doctor or health care professional for advice if you get a fever, chills or sore  throat, or other symptoms of a cold or flu. Do not treat yourself. This drug decreases your body's ability to fight infections. Try to avoid being around people who are sick. This medicine may increase your risk to bruise or bleed. Call your doctor or health care professional if you notice any unusual bleeding. Be careful brushing and flossing your teeth or using a toothpick because you may get an infection or bleed more easily. If you have any dental work done, tell your dentist you are receiving this medicine. You may get drowsy or dizzy. Do not drive, use machinery, or do anything that needs mental alertness until you know how this medicine affects you. Do not become pregnant while taking this medicine or for 1 year after stopping it. Women should inform their doctor if they wish to become pregnant or think they might be pregnant. Men should not father a child while taking this medicine and for 4 months after stopping it. There is a potential for serious side effects to an unborn child. Talk to your health care professional or pharmacist for more information. Do not breast-feed an infant while taking this medicine. This medicine may interfere with the ability to have a child. This medicine has caused ovarian failure in some women. This medicine has caused reduced sperm counts in some men. You should talk with your doctor or health care professional if you are concerned about your fertility. If you are going to have surgery, tell your doctor or health care professional that you have taken this medicine. What side effects may I notice from receiving this medicine? Side effects that you should report to your doctor or health care professional as soon as possible: -allergic reactions like skin rash, itching or hives, swelling of the face, lips, or tongue -low blood counts - this medicine may decrease the number of white blood cells, red blood cells and platelets. You may be at increased risk for infections  and bleeding. -signs of infection - fever or chills, cough, sore throat, pain or difficulty passing urine -signs of decreased platelets or bleeding - bruising, pinpoint red spots on the skin, black, tarry stools, blood in the urine -signs of decreased red blood cells - unusually weak or tired, fainting spells, lightheadedness -breathing problems -dark urine -dizziness -palpitations -swelling of the ankles, feet, hands -trouble passing urine or change in the amount of urine -weight gain -yellowing of the eyes or skin Side effects that usually do not require medical attention (report to your doctor or health care professional if they continue or are bothersome): -changes in nail or skin color -hair loss -missed menstrual periods -mouth sores -nausea, vomiting This list may not describe all possible side effects. Call your doctor for medical advice about side effects. You may report side effects to FDA at 1-800-FDA-1088. Where should I keep my medicine? This drug is given in a hospital or clinic and will not be stored at home. NOTE: This sheet is a summary. It may not cover all possible information. If you have questions about this medicine, talk to your doctor, pharmacist, or health care provider.  2019 Elsevier/Gold Standard (2011-12-25 16:22:58) Carfilzomib injection What is this medicine? CARFILZOMIB (kar FILZ oh mib) targets a specific protein within cancer cells and stops the cancer cells from growing. It is used to treat multiple myeloma. This medicine may be used for   other purposes; ask your health care provider or pharmacist if you have questions. COMMON BRAND NAME(S): KYPROLIS What should I tell my health care provider before I take this medicine? They need to know if you have any of these conditions: -heart disease -history of blood clots -irregular heartbeat -kidney disease -liver disease -lung or breathing disease -an unusual or allergic reaction to carfilzomib, or  other medicines, foods, dyes, or preservatives -pregnant or trying to get pregnant -breast-feeding How should I use this medicine? This medicine is for injection or infusion into a vein. It is given by a health care professional in a hospital or clinic setting. Talk to your pediatrician regarding the use of this medicine in children. Special care may be needed. Overdosage: If you think you have taken too much of this medicine contact a poison control center or emergency room at once. NOTE: This medicine is only for you. Do not share this medicine with others. What if I miss a dose? It is important not to miss your dose. Call your doctor or health care professional if you are unable to keep an appointment. What may interact with this medicine? Interactions are not expected. Give your health care provider a list of all the medicines, herbs, non-prescription drugs, or dietary supplements you use. Also tell them if you smoke, drink alcohol, or use illegal drugs. Some items may interact with your medicine. This list may not describe all possible interactions. Give your health care provider a list of all the medicines, herbs, non-prescription drugs, or dietary supplements you use. Also tell them if you smoke, drink alcohol, or use illegal drugs. Some items may interact with your medicine. What should I watch for while using this medicine? Your condition will be monitored carefully while you are receiving this medicine. Report any side effects. Continue your course of treatment even though you feel ill unless your doctor tells you to stop. You may need blood work done while you are taking this medicine. Do not become pregnant while taking this medicine or for at least 6 months after stopping it. Women should inform their doctor if they wish to become pregnant or think they might be pregnant. There is a potential for serious side effects to an unborn child. Men should not father a child while taking this  medicine and for at least 3 months after stopping it. Talk to your health care professional or pharmacist for more information. Do not breast-feed an infant while taking this medicine or for 2 weeks after the last dose. Check with your doctor or health care professional if you get an attack of severe diarrhea, nausea and vomiting, or if you sweat a lot. The loss of too much body fluid can make it dangerous for you to take this medicine. You may get dizzy. Do not drive, use machinery, or do anything that needs mental alertness until you know how this medicine affects you. Do not stand or sit up quickly, especially if you are an older patient. This reduces the risk of dizzy or fainting spells. What side effects may I notice from receiving this medicine? Side effects that you should report to your doctor or health care professional as soon as possible: -allergic reactions like skin rash, itching or hives, swelling of the face, lips, or tongue -confusion -dizziness -feeling faint or lightheaded -fever or chills -palpitations -seizures -signs and symptoms of bleeding such as bloody or black, tarry stools; red or dark-brown urine; spitting up blood or brown material that looks like   coffee grounds; red spots on the skin; unusual bruising or bleeding including from the eye, gums, or nose -signs and symptoms of a blood clot such as breathing problems; changes in vision; chest pain; severe, sudden headache; pain, swelling, warmth in the leg; trouble speaking; sudden numbness or weakness of the face, arm or leg -signs and symptoms of kidney injury like trouble passing urine or change in the amount of urine -signs and symptoms of liver injury like dark yellow or brown urine; general ill feeling or flu-like symptoms; light-colored stools; loss of appetite; nausea; right upper belly pain; unusually weak or tired; yellowing of the eyes or skin Side effects that usually do not require medical attention (report to  your doctor or health care professional if they continue or are bothersome): -back pain -cough -diarrhea -headache -muscle cramps -vomiting This list may not describe all possible side effects. Call your doctor for medical advice about side effects. You may report side effects to FDA at 1-800-FDA-1088. Where should I keep my medicine? This drug is given in a hospital or clinic and will not be stored at home. NOTE: This sheet is a summary. It may not cover all possible information. If you have questions about this medicine, talk to your doctor, pharmacist, or health care provider.  2019 Elsevier/Gold Standard (2016-11-25 14:07:13)  

## 2018-03-28 NOTE — Progress Notes (Unsigned)
Okay to treat with SCr = 1.93

## 2018-03-28 NOTE — Patient Instructions (Signed)
Implanted Port Insertion, Care After  This sheet gives you information about how to care for yourself after your procedure. Your health care provider may also give you more specific instructions. If you have problems or questions, contact your health care provider.  What can I expect after the procedure?  After the procedure, it is common to have:  · Discomfort at the port insertion site.  · Bruising on the skin over the port. This should improve over 3-4 days.  Follow these instructions at home:  Port care  · After your port is placed, you will get a manufacturer's information card. The card has information about your port. Keep this card with you at all times.  · Take care of the port as told by your health care provider. Ask your health care provider if you or a family member can get training for taking care of the port at home. A home health care nurse may also take care of the port.  · Make sure to remember what type of port you have.  Incision care         · Follow instructions from your health care provider about how to take care of your port insertion site. Make sure you:  ? Wash your hands with soap and water before and after you change your bandage (dressing). If soap and water are not available, use hand sanitizer.  ? Change your dressing as told by your health care provider.  ? Leave stitches (sutures), skin glue, or adhesive strips in place. These skin closures may need to stay in place for 2 weeks or longer. If adhesive strip edges start to loosen and curl up, you may trim the loose edges. Do not remove adhesive strips completely unless your health care provider tells you to do that.  · Check your port insertion site every day for signs of infection. Check for:  ? Redness, swelling, or pain.  ? Fluid or blood.  ? Warmth.  ? Pus or a bad smell.  Activity  · Return to your normal activities as told by your health care provider. Ask your health care provider what activities are safe for you.  · Do not  lift anything that is heavier than 10 lb (4.5 kg), or the limit that you are told, until your health care provider says that it is safe.  General instructions  · Take over-the-counter and prescription medicines only as told by your health care provider.  · Do not take baths, swim, or use a hot tub until your health care provider approves. Ask your health care provider if you may take showers. You may only be allowed to take sponge baths.  · Do not drive for 24 hours if you were given a sedative during your procedure.  · Wear a medical alert bracelet in case of an emergency. This will tell any health care providers that you have a port.  · Keep all follow-up visits as told by your health care provider. This is important.  Contact a health care provider if:  · You cannot flush your port with saline as directed, or you cannot draw blood from the port.  · You have a fever or chills.  · You have redness, swelling, or pain around your port insertion site.  · You have fluid or blood coming from your port insertion site.  · Your port insertion site feels warm to the touch.  · You have pus or a bad smell coming from the port   insertion site.  Get help right away if:  · You have chest pain or shortness of breath.  · You have bleeding from your port that you cannot control.  Summary  · Take care of the port as told by your health care provider. Keep the manufacturer's information card with you at all times.  · Change your dressing as told by your health care provider.  · Contact a health care provider if you have a fever or chills or if you have redness, swelling, or pain around your port insertion site.  · Keep all follow-up visits as told by your health care provider.  This information is not intended to replace advice given to you by your health care provider. Make sure you discuss any questions you have with your health care provider.  Document Released: 11/30/2012 Document Revised: 09/07/2017 Document Reviewed:  09/07/2017  Elsevier Interactive Patient Education © 2019 [employer]

## 2018-03-29 LAB — IGG, IGA, IGM
IGM (IMMUNOGLOBULIN M), SRM: 22 mg/dL (ref 20–172)
IgA: 135 mg/dL (ref 61–437)
IgG (Immunoglobin G), Serum: 783 mg/dL (ref 700–1600)

## 2018-03-29 LAB — PROTEIN ELECTROPHORESIS, SERUM
A/G Ratio: 1.3 (ref 0.7–1.7)
Albumin ELP: 3.7 g/dL (ref 2.9–4.4)
Alpha-1-Globulin: 0.2 g/dL (ref 0.0–0.4)
Alpha-2-Globulin: 1.1 g/dL — ABNORMAL HIGH (ref 0.4–1.0)
Beta Globulin: 0.8 g/dL (ref 0.7–1.3)
Gamma Globulin: 0.6 g/dL (ref 0.4–1.8)
Globulin, Total: 2.8 g/dL (ref 2.2–3.9)
M-Spike, %: 0.2 g/dL — ABNORMAL HIGH
Total Protein ELP: 6.5 g/dL (ref 6.0–8.5)

## 2018-03-29 LAB — KAPPA/LAMBDA LIGHT CHAINS
Kappa free light chain: 21.8 mg/L — ABNORMAL HIGH (ref 3.3–19.4)
Kappa, lambda light chain ratio: 1.44 (ref 0.26–1.65)
Lambda free light chains: 15.1 mg/L (ref 5.7–26.3)

## 2018-04-01 ENCOUNTER — Other Ambulatory Visit: Payer: Self-pay | Admitting: *Deleted

## 2018-04-01 DIAGNOSIS — C9000 Multiple myeloma not having achieved remission: Secondary | ICD-10-CM

## 2018-04-01 DIAGNOSIS — D631 Anemia in chronic kidney disease: Secondary | ICD-10-CM

## 2018-04-01 DIAGNOSIS — N189 Chronic kidney disease, unspecified: Principal | ICD-10-CM

## 2018-04-04 ENCOUNTER — Inpatient Hospital Stay: Payer: Medicare Other

## 2018-04-04 ENCOUNTER — Other Ambulatory Visit: Payer: Self-pay

## 2018-04-04 VITALS — BP 135/74 | HR 72 | Temp 98.4°F | Resp 18

## 2018-04-04 DIAGNOSIS — C9000 Multiple myeloma not having achieved remission: Secondary | ICD-10-CM

## 2018-04-04 DIAGNOSIS — N189 Chronic kidney disease, unspecified: Principal | ICD-10-CM

## 2018-04-04 DIAGNOSIS — D631 Anemia in chronic kidney disease: Secondary | ICD-10-CM

## 2018-04-04 DIAGNOSIS — Z299 Encounter for prophylactic measures, unspecified: Secondary | ICD-10-CM

## 2018-04-04 DIAGNOSIS — M4712 Other spondylosis with myelopathy, cervical region: Secondary | ICD-10-CM

## 2018-04-04 DIAGNOSIS — E349 Endocrine disorder, unspecified: Secondary | ICD-10-CM

## 2018-04-04 DIAGNOSIS — Z5112 Encounter for antineoplastic immunotherapy: Secondary | ICD-10-CM | POA: Diagnosis not present

## 2018-04-04 LAB — CBC WITH DIFFERENTIAL (CANCER CENTER ONLY)
Abs Immature Granulocytes: 0.03 10*3/uL (ref 0.00–0.07)
BASOS ABS: 0 10*3/uL (ref 0.0–0.1)
Basophils Relative: 1 %
EOS PCT: 2 %
Eosinophils Absolute: 0.1 10*3/uL (ref 0.0–0.5)
HCT: 31.4 % — ABNORMAL LOW (ref 39.0–52.0)
Hemoglobin: 9.9 g/dL — ABNORMAL LOW (ref 13.0–17.0)
Immature Granulocytes: 1 %
Lymphocytes Relative: 14 %
Lymphs Abs: 0.6 10*3/uL — ABNORMAL LOW (ref 0.7–4.0)
MCH: 25.4 pg — ABNORMAL LOW (ref 26.0–34.0)
MCHC: 31.5 g/dL (ref 30.0–36.0)
MCV: 80.5 fL (ref 80.0–100.0)
Monocytes Absolute: 0.7 10*3/uL (ref 0.1–1.0)
Monocytes Relative: 17 %
Neutro Abs: 2.8 10*3/uL (ref 1.7–7.7)
Neutrophils Relative %: 65 %
Platelet Count: 179 10*3/uL (ref 150–400)
RBC: 3.9 MIL/uL — ABNORMAL LOW (ref 4.22–5.81)
RDW: 18.9 % — ABNORMAL HIGH (ref 11.5–15.5)
WBC Count: 4.3 10*3/uL (ref 4.0–10.5)
nRBC: 0 % (ref 0.0–0.2)

## 2018-04-04 LAB — CMP (CANCER CENTER ONLY)
ALT: 13 U/L (ref 0–44)
AST: 15 U/L (ref 15–41)
Albumin: 4.2 g/dL (ref 3.5–5.0)
Alkaline Phosphatase: 53 U/L (ref 38–126)
Anion gap: 7 (ref 5–15)
BUN: 38 mg/dL — ABNORMAL HIGH (ref 8–23)
CO2: 27 mmol/L (ref 22–32)
Calcium: 9.3 mg/dL (ref 8.9–10.3)
Chloride: 105 mmol/L (ref 98–111)
Creatinine: 2.3 mg/dL — ABNORMAL HIGH (ref 0.61–1.24)
GFR, Est AFR Am: 33 mL/min — ABNORMAL LOW (ref >60)
GFR, Estimated: 28 mL/min — ABNORMAL LOW (ref >60)
Glucose, Bld: 97 mg/dL (ref 70–99)
Potassium: 4 mmol/L (ref 3.5–5.1)
Sodium: 139 mmol/L (ref 135–145)
TOTAL PROTEIN: 6.5 g/dL (ref 6.5–8.1)
Total Bilirubin: 0.3 mg/dL (ref 0.3–1.2)

## 2018-04-04 MED ORDER — DARBEPOETIN ALFA 300 MCG/0.6ML IJ SOSY
PREFILLED_SYRINGE | INTRAMUSCULAR | Status: AC
  Filled 2018-04-04: qty 0.6

## 2018-04-04 MED ORDER — HEPARIN SOD (PORK) LOCK FLUSH 100 UNIT/ML IV SOLN
500.0000 [IU] | Freq: Once | INTRAVENOUS | Status: AC | PRN
  Administered 2018-04-04: 500 [IU]
  Filled 2018-04-04: qty 5

## 2018-04-04 MED ORDER — PALONOSETRON HCL INJECTION 0.25 MG/5ML
0.2500 mg | Freq: Once | INTRAVENOUS | Status: AC
  Administered 2018-04-04: 0.25 mg via INTRAVENOUS

## 2018-04-04 MED ORDER — DEXAMETHASONE SODIUM PHOSPHATE 10 MG/ML IJ SOLN
10.0000 mg | Freq: Once | INTRAMUSCULAR | Status: AC
  Administered 2018-04-04: 10 mg via INTRAVENOUS

## 2018-04-04 MED ORDER — HYDROMORPHONE HCL 4 MG/ML IJ SOLN
2.0000 mg | Freq: Once | INTRAMUSCULAR | Status: AC
  Administered 2018-04-04: 2 mg via INTRAVENOUS

## 2018-04-04 MED ORDER — DEXAMETHASONE SODIUM PHOSPHATE 10 MG/ML IJ SOLN
INTRAMUSCULAR | Status: AC
  Filled 2018-04-04: qty 1

## 2018-04-04 MED ORDER — PALONOSETRON HCL INJECTION 0.25 MG/5ML
INTRAVENOUS | Status: AC
  Filled 2018-04-04: qty 5

## 2018-04-04 MED ORDER — SODIUM CHLORIDE 0.9% FLUSH
10.0000 mL | INTRAVENOUS | Status: DC | PRN
  Administered 2018-04-04: 10 mL
  Filled 2018-04-04: qty 10

## 2018-04-04 MED ORDER — SODIUM CHLORIDE 0.9 % IV SOLN
Freq: Once | INTRAVENOUS | Status: AC
  Administered 2018-04-04: 09:00:00 via INTRAVENOUS
  Filled 2018-04-04: qty 250

## 2018-04-04 MED ORDER — DARBEPOETIN ALFA 300 MCG/0.6ML IJ SOSY
300.0000 ug | PREFILLED_SYRINGE | Freq: Once | INTRAMUSCULAR | Status: AC
  Administered 2018-04-04: 300 ug via SUBCUTANEOUS

## 2018-04-04 MED ORDER — DEXTROSE 5 % IV SOLN
29.0000 mg/m2 | Freq: Once | INTRAVENOUS | Status: AC
  Administered 2018-04-04: 60 mg via INTRAVENOUS
  Filled 2018-04-04: qty 30

## 2018-04-04 MED ORDER — HYDROMORPHONE HCL 4 MG/ML IJ SOLN
INTRAMUSCULAR | Status: AC
  Filled 2018-04-04: qty 1

## 2018-04-04 MED ORDER — SODIUM CHLORIDE 0.9% FLUSH
10.0000 mL | Freq: Once | INTRAVENOUS | Status: AC
  Administered 2018-04-04: 10 mL
  Filled 2018-04-04: qty 10

## 2018-04-04 MED ORDER — SODIUM CHLORIDE 0.9 % IV SOLN
300.0000 mg/m2 | Freq: Once | INTRAVENOUS | Status: AC
  Administered 2018-04-04: 620 mg via INTRAVENOUS
  Filled 2018-04-04: qty 31

## 2018-04-04 NOTE — Patient Instructions (Signed)

## 2018-04-04 NOTE — Patient Instructions (Signed)
[facility] Discharge Instructions for Patients Receiving Chemotherapy  Today you received the following chemotherapy agents:  Kyprolis & Cytoxan  To help prevent nausea and vomiting after your treatment, we encourage you to take your nausea medication as prescribed.    If you develop nausea and vomiting that is not controlled by your nausea medication, call the clinic.   BELOW ARE SYMPTOMS THAT SHOULD BE REPORTED IMMEDIATELY:  *FEVER GREATER THAN 100.5 F  *CHILLS WITH OR WITHOUT FEVER  NAUSEA AND VOMITING THAT IS NOT CONTROLLED WITH YOUR NAUSEA MEDICATION  *UNUSUAL SHORTNESS OF BREATH  *UNUSUAL BRUISING OR BLEEDING  TENDERNESS IN MOUTH AND THROAT WITH OR WITHOUT PRESENCE OF ULCERS  *URINARY PROBLEMS  *BOWEL PROBLEMS  UNUSUAL RASH Items with * indicate a potential emergency and should be followed up as soon as possible.  Feel free to call the clinic should you have any questions or concerns. The clinic phone number is [phone].  Please show the CHEMO ALERT CARD at check-in to the Emergency Department and triage nurse.   

## 2018-04-08 ENCOUNTER — Other Ambulatory Visit: Payer: Self-pay | Admitting: *Deleted

## 2018-04-08 DIAGNOSIS — C9000 Multiple myeloma not having achieved remission: Secondary | ICD-10-CM

## 2018-04-11 ENCOUNTER — Inpatient Hospital Stay: Payer: Medicare Other

## 2018-04-11 ENCOUNTER — Other Ambulatory Visit: Payer: Self-pay

## 2018-04-11 VITALS — BP 155/77 | HR 98 | Temp 98.5°F | Resp 18

## 2018-04-11 DIAGNOSIS — C9000 Multiple myeloma not having achieved remission: Secondary | ICD-10-CM

## 2018-04-11 DIAGNOSIS — N189 Chronic kidney disease, unspecified: Principal | ICD-10-CM

## 2018-04-11 DIAGNOSIS — Z5112 Encounter for antineoplastic immunotherapy: Secondary | ICD-10-CM | POA: Diagnosis not present

## 2018-04-11 DIAGNOSIS — M4712 Other spondylosis with myelopathy, cervical region: Secondary | ICD-10-CM

## 2018-04-11 DIAGNOSIS — E349 Endocrine disorder, unspecified: Secondary | ICD-10-CM

## 2018-04-11 DIAGNOSIS — Z299 Encounter for prophylactic measures, unspecified: Secondary | ICD-10-CM

## 2018-04-11 DIAGNOSIS — D631 Anemia in chronic kidney disease: Secondary | ICD-10-CM

## 2018-04-11 LAB — CBC WITH DIFFERENTIAL (CANCER CENTER ONLY)
Abs Immature Granulocytes: 0.03 10*3/uL (ref 0.00–0.07)
Basophils Absolute: 0 10*3/uL (ref 0.0–0.1)
Basophils Relative: 1 %
Eosinophils Absolute: 0.1 10*3/uL (ref 0.0–0.5)
Eosinophils Relative: 1 %
HEMATOCRIT: 32.4 % — AB (ref 39.0–52.0)
Hemoglobin: 10.1 g/dL — ABNORMAL LOW (ref 13.0–17.0)
Immature Granulocytes: 1 %
LYMPHS ABS: 0.5 10*3/uL — AB (ref 0.7–4.0)
Lymphocytes Relative: 8 %
MCH: 25.4 pg — ABNORMAL LOW (ref 26.0–34.0)
MCHC: 31.2 g/dL (ref 30.0–36.0)
MCV: 81.4 fL (ref 80.0–100.0)
Monocytes Absolute: 0.6 10*3/uL (ref 0.1–1.0)
Monocytes Relative: 10 %
Neutro Abs: 4.8 10*3/uL (ref 1.7–7.7)
Neutrophils Relative %: 79 %
Platelet Count: 164 10*3/uL (ref 150–400)
RBC: 3.98 MIL/uL — ABNORMAL LOW (ref 4.22–5.81)
RDW: 20.2 % — ABNORMAL HIGH (ref 11.5–15.5)
WBC Count: 6 10*3/uL (ref 4.0–10.5)
nRBC: 0.7 % — ABNORMAL HIGH (ref 0.0–0.2)

## 2018-04-11 LAB — COMPREHENSIVE METABOLIC PANEL
ALT: 15 U/L (ref 0–44)
AST: 14 U/L — ABNORMAL LOW (ref 15–41)
Albumin: 4.2 g/dL (ref 3.5–5.0)
Alkaline Phosphatase: 56 U/L (ref 38–126)
Anion gap: 10 (ref 5–15)
BUN: 20 mg/dL (ref 8–23)
CO2: 25 mmol/L (ref 22–32)
Calcium: 9.2 mg/dL (ref 8.9–10.3)
Chloride: 104 mmol/L (ref 98–111)
Creatinine, Ser: 2.36 mg/dL — ABNORMAL HIGH (ref 0.61–1.24)
GFR calc Af Amer: 32 mL/min — ABNORMAL LOW (ref >60)
GFR calc non Af Amer: 27 mL/min — ABNORMAL LOW (ref >60)
Glucose, Bld: 184 mg/dL — ABNORMAL HIGH (ref 70–99)
Potassium: 3.8 mmol/L (ref 3.5–5.1)
Sodium: 139 mmol/L (ref 135–145)
Total Bilirubin: 0.3 mg/dL (ref 0.3–1.2)
Total Protein: 6.4 g/dL — ABNORMAL LOW (ref 6.5–8.1)

## 2018-04-11 MED ORDER — DEXTROSE 5 % IV SOLN
29.0000 mg/m2 | Freq: Once | INTRAVENOUS | Status: AC
  Administered 2018-04-11: 60 mg via INTRAVENOUS
  Filled 2018-04-11: qty 30

## 2018-04-11 MED ORDER — HEPARIN SOD (PORK) LOCK FLUSH 100 UNIT/ML IV SOLN
250.0000 [IU] | Freq: Once | INTRAVENOUS | Status: DC | PRN
  Filled 2018-04-11: qty 5

## 2018-04-11 MED ORDER — SODIUM CHLORIDE 0.9% FLUSH
10.0000 mL | INTRAVENOUS | Status: DC | PRN
  Administered 2018-04-11: 10 mL
  Filled 2018-04-11: qty 10

## 2018-04-11 MED ORDER — SODIUM CHLORIDE 0.9 % IV SOLN
300.0000 mg/m2 | Freq: Once | INTRAVENOUS | Status: AC
  Administered 2018-04-11: 620 mg via INTRAVENOUS
  Filled 2018-04-11: qty 31

## 2018-04-11 MED ORDER — SODIUM CHLORIDE 0.9% FLUSH
3.0000 mL | Freq: Once | INTRAVENOUS | Status: DC | PRN
  Filled 2018-04-11: qty 10

## 2018-04-11 MED ORDER — PALONOSETRON HCL INJECTION 0.25 MG/5ML
INTRAVENOUS | Status: AC
  Filled 2018-04-11: qty 5

## 2018-04-11 MED ORDER — SODIUM CHLORIDE 0.9% FLUSH
10.0000 mL | Freq: Once | INTRAVENOUS | Status: AC
  Administered 2018-04-11: 10 mL
  Filled 2018-04-11: qty 10

## 2018-04-11 MED ORDER — SODIUM CHLORIDE 0.9 % IV SOLN
Freq: Once | INTRAVENOUS | Status: AC
  Administered 2018-04-11: 09:00:00 via INTRAVENOUS
  Filled 2018-04-11: qty 250

## 2018-04-11 MED ORDER — DEXAMETHASONE SODIUM PHOSPHATE 10 MG/ML IJ SOLN
INTRAMUSCULAR | Status: AC
  Filled 2018-04-11: qty 1

## 2018-04-11 MED ORDER — HEPARIN SOD (PORK) LOCK FLUSH 100 UNIT/ML IV SOLN
500.0000 [IU] | Freq: Once | INTRAVENOUS | Status: AC | PRN
  Administered 2018-04-11: 500 [IU]
  Filled 2018-04-11: qty 5

## 2018-04-11 MED ORDER — HYDROMORPHONE HCL 1 MG/ML IJ SOLN
INTRAMUSCULAR | Status: AC
  Filled 2018-04-11: qty 2

## 2018-04-11 MED ORDER — HYDROMORPHONE HCL 1 MG/ML IJ SOLN
2.0000 mg | Freq: Once | INTRAMUSCULAR | Status: AC
  Administered 2018-04-11: 2 mg via INTRAVENOUS

## 2018-04-11 MED ORDER — PALONOSETRON HCL INJECTION 0.25 MG/5ML
0.2500 mg | Freq: Once | INTRAVENOUS | Status: AC
  Administered 2018-04-11: 0.25 mg via INTRAVENOUS

## 2018-04-11 MED ORDER — HEPARIN SOD (PORK) LOCK FLUSH 100 UNIT/ML IV SOLN
500.0000 [IU] | Freq: Once | INTRAVENOUS | Status: DC | PRN
  Filled 2018-04-11: qty 5

## 2018-04-11 MED ORDER — ALTEPLASE 2 MG IJ SOLR
2.0000 mg | Freq: Once | INTRAMUSCULAR | Status: DC | PRN
  Filled 2018-04-11: qty 2

## 2018-04-11 MED ORDER — DEXAMETHASONE SODIUM PHOSPHATE 10 MG/ML IJ SOLN
10.0000 mg | Freq: Once | INTRAMUSCULAR | Status: AC
  Administered 2018-04-11: 10 mg via INTRAVENOUS

## 2018-04-11 NOTE — Patient Instructions (Signed)

## 2018-04-11 NOTE — Patient Instructions (Signed)
[facility] Discharge Instructions for Patients Receiving Chemotherapy  Today you received the following chemotherapy agents:  Kyprolis & Cytoxan  To help prevent nausea and vomiting after your treatment, we encourage you to take your nausea medication as prescribed.    If you develop nausea and vomiting that is not controlled by your nausea medication, call the clinic.   BELOW ARE SYMPTOMS THAT SHOULD BE REPORTED IMMEDIATELY:  *FEVER GREATER THAN 100.5 F  *CHILLS WITH OR WITHOUT FEVER  NAUSEA AND VOMITING THAT IS NOT CONTROLLED WITH YOUR NAUSEA MEDICATION  *UNUSUAL SHORTNESS OF BREATH  *UNUSUAL BRUISING OR BLEEDING  TENDERNESS IN MOUTH AND THROAT WITH OR WITHOUT PRESENCE OF ULCERS  *URINARY PROBLEMS  *BOWEL PROBLEMS  UNUSUAL RASH Items with * indicate a potential emergency and should be followed up as soon as possible.  Feel free to call the clinic should you have any questions or concerns. The clinic phone number is [phone].  Please show the CHEMO ALERT CARD at check-in to the Emergency Department and triage nurse.   

## 2018-04-11 NOTE — Progress Notes (Signed)
Ok to treat with creatinine 2.36 per Dr. [name].

## 2018-04-20 ENCOUNTER — Encounter: Payer: Self-pay | Admitting: *Deleted

## 2018-04-25 ENCOUNTER — Inpatient Hospital Stay: Payer: Medicare Other | Attending: Hematology & Oncology | Admitting: Hematology & Oncology

## 2018-04-25 ENCOUNTER — Inpatient Hospital Stay: Payer: Medicare Other

## 2018-04-25 ENCOUNTER — Other Ambulatory Visit: Payer: Self-pay

## 2018-04-25 ENCOUNTER — Encounter: Payer: Self-pay | Admitting: Hematology & Oncology

## 2018-04-25 VITALS — BP 138/66 | HR 95 | Temp 98.4°F | Resp 18 | Wt 192.8 lb

## 2018-04-25 DIAGNOSIS — Z5112 Encounter for antineoplastic immunotherapy: Secondary | ICD-10-CM | POA: Insufficient documentation

## 2018-04-25 DIAGNOSIS — E119 Type 2 diabetes mellitus without complications: Secondary | ICD-10-CM | POA: Diagnosis not present

## 2018-04-25 DIAGNOSIS — Z5111 Encounter for antineoplastic chemotherapy: Secondary | ICD-10-CM | POA: Insufficient documentation

## 2018-04-25 DIAGNOSIS — C9000 Multiple myeloma not having achieved remission: Secondary | ICD-10-CM

## 2018-04-25 DIAGNOSIS — D631 Anemia in chronic kidney disease: Secondary | ICD-10-CM

## 2018-04-25 DIAGNOSIS — N189 Chronic kidney disease, unspecified: Principal | ICD-10-CM

## 2018-04-25 DIAGNOSIS — D509 Iron deficiency anemia, unspecified: Secondary | ICD-10-CM

## 2018-04-25 DIAGNOSIS — C9002 Multiple myeloma in relapse: Secondary | ICD-10-CM

## 2018-04-25 DIAGNOSIS — E349 Endocrine disorder, unspecified: Secondary | ICD-10-CM

## 2018-04-25 DIAGNOSIS — M4712 Other spondylosis with myelopathy, cervical region: Secondary | ICD-10-CM

## 2018-04-25 DIAGNOSIS — Z299 Encounter for prophylactic measures, unspecified: Secondary | ICD-10-CM

## 2018-04-25 LAB — CBC WITH DIFFERENTIAL (CANCER CENTER ONLY)
ABS IMMATURE GRANULOCYTES: 0.02 10*3/uL (ref 0.00–0.07)
BASOS PCT: 1 %
Basophils Absolute: 0.1 10*3/uL (ref 0.0–0.1)
Eosinophils Absolute: 0.2 10*3/uL (ref 0.0–0.5)
Eosinophils Relative: 4 %
HCT: 32.7 % — ABNORMAL LOW (ref 39.0–52.0)
HEMOGLOBIN: 10.2 g/dL — AB (ref 13.0–17.0)
Immature Granulocytes: 1 %
Lymphocytes Relative: 16 %
Lymphs Abs: 0.7 10*3/uL (ref 0.7–4.0)
MCH: 25.4 pg — ABNORMAL LOW (ref 26.0–34.0)
MCHC: 31.2 g/dL (ref 30.0–36.0)
MCV: 81.3 fL (ref 80.0–100.0)
MONO ABS: 0.5 10*3/uL (ref 0.1–1.0)
Monocytes Relative: 13 %
Neutro Abs: 2.8 10*3/uL (ref 1.7–7.7)
Neutrophils Relative %: 65 %
Platelet Count: 194 10*3/uL (ref 150–400)
RBC: 4.02 MIL/uL — ABNORMAL LOW (ref 4.22–5.81)
RDW: 19.2 % — ABNORMAL HIGH (ref 11.5–15.5)
WBC Count: 4.2 10*3/uL (ref 4.0–10.5)
nRBC: 0 % (ref 0.0–0.2)

## 2018-04-25 LAB — CMP (CANCER CENTER ONLY)
ALK PHOS: 57 U/L (ref 38–126)
ALT: 8 U/L (ref 0–44)
AST: 11 U/L — AB (ref 15–41)
Albumin: 4.2 g/dL (ref 3.5–5.0)
Anion gap: 10 (ref 5–15)
BUN: 23 mg/dL (ref 8–23)
CO2: 25 mmol/L (ref 22–32)
Calcium: 9.2 mg/dL (ref 8.9–10.3)
Chloride: 104 mmol/L (ref 98–111)
Creatinine: 2.39 mg/dL — ABNORMAL HIGH (ref 0.61–1.24)
GFR, Est AFR Am: 31 mL/min — ABNORMAL LOW (ref >60)
GFR, Estimated: 27 mL/min — ABNORMAL LOW (ref >60)
Glucose, Bld: 203 mg/dL — ABNORMAL HIGH (ref 70–99)
Potassium: 3.8 mmol/L (ref 3.5–5.1)
Sodium: 139 mmol/L (ref 135–145)
Total Bilirubin: 0.3 mg/dL (ref 0.3–1.2)
Total Protein: 7 g/dL (ref 6.5–8.1)

## 2018-04-25 LAB — IRON AND TIBC
Iron: 55 ug/dL (ref 42–163)
Saturation Ratios: 24 % (ref 20–55)
TIBC: 226 ug/dL (ref 202–409)
UIBC: 171 ug/dL (ref 117–376)

## 2018-04-25 LAB — FERRITIN: Ferritin: 1015 ng/mL — ABNORMAL HIGH (ref 24–336)

## 2018-04-25 MED ORDER — SODIUM CHLORIDE 0.9 % IV SOLN
Freq: Once | INTRAVENOUS | Status: AC
  Administered 2018-04-25: 09:00:00 via INTRAVENOUS
  Filled 2018-04-25: qty 250

## 2018-04-25 MED ORDER — DEXAMETHASONE SODIUM PHOSPHATE 10 MG/ML IJ SOLN
INTRAMUSCULAR | Status: AC
  Filled 2018-04-25: qty 1

## 2018-04-25 MED ORDER — TESTOSTERONE CYPIONATE 200 MG/ML IM SOLN
400.0000 mg | Freq: Once | INTRAMUSCULAR | Status: AC
  Administered 2018-04-25: 400 mg via INTRAMUSCULAR

## 2018-04-25 MED ORDER — DEXAMETHASONE SODIUM PHOSPHATE 10 MG/ML IJ SOLN
10.0000 mg | Freq: Once | INTRAMUSCULAR | Status: AC
  Administered 2018-04-25: 10 mg via INTRAVENOUS

## 2018-04-25 MED ORDER — DEXTROSE 5 % IV SOLN
60.0000 mg | Freq: Once | INTRAVENOUS | Status: AC
  Administered 2018-04-25: 60 mg via INTRAVENOUS
  Filled 2018-04-25: qty 30

## 2018-04-25 MED ORDER — SODIUM CHLORIDE 0.9% FLUSH
10.0000 mL | INTRAVENOUS | Status: DC | PRN
  Administered 2018-04-25: 10 mL
  Filled 2018-04-25: qty 10

## 2018-04-25 MED ORDER — PALONOSETRON HCL INJECTION 0.25 MG/5ML
INTRAVENOUS | Status: AC
  Filled 2018-04-25: qty 5

## 2018-04-25 MED ORDER — HYDROMORPHONE HCL 1 MG/ML IJ SOLN
2.0000 mg | Freq: Once | INTRAMUSCULAR | Status: AC
  Administered 2018-04-25: 2 mg via INTRAVENOUS

## 2018-04-25 MED ORDER — PALONOSETRON HCL INJECTION 0.25 MG/5ML
0.2500 mg | Freq: Once | INTRAVENOUS | Status: AC
  Administered 2018-04-25: 0.25 mg via INTRAVENOUS

## 2018-04-25 MED ORDER — HEPARIN SOD (PORK) LOCK FLUSH 100 UNIT/ML IV SOLN
500.0000 [IU] | Freq: Once | INTRAVENOUS | Status: AC | PRN
  Administered 2018-04-25: 500 [IU]
  Filled 2018-04-25: qty 5

## 2018-04-25 MED ORDER — SODIUM CHLORIDE 0.9% FLUSH
10.0000 mL | Freq: Once | INTRAVENOUS | Status: AC
  Administered 2018-04-25: 10 mL
  Filled 2018-04-25: qty 10

## 2018-04-25 MED ORDER — SODIUM CHLORIDE 0.9 % IV SOLN
Freq: Once | INTRAVENOUS | Status: AC
  Filled 2018-04-25: qty 250

## 2018-04-25 MED ORDER — HYDROMORPHONE HCL 1 MG/ML IJ SOLN
INTRAMUSCULAR | Status: AC
  Filled 2018-04-25: qty 2

## 2018-04-25 MED ORDER — SODIUM CHLORIDE 0.9 % IV SOLN
300.0000 mg/m2 | Freq: Once | INTRAVENOUS | Status: AC
  Administered 2018-04-25: 620 mg via INTRAVENOUS
  Filled 2018-04-25: qty 31

## 2018-04-25 MED ORDER — TESTOSTERONE CYPIONATE 200 MG/ML IM SOLN
INTRAMUSCULAR | Status: AC
  Filled 2018-04-25: qty 2

## 2018-04-25 NOTE — Patient Instructions (Signed)

## 2018-04-25 NOTE — Patient Instructions (Signed)
Cyclophosphamide injection What is this medicine? CYCLOPHOSPHAMIDE (sye kloe FOSS fa mide) is a chemotherapy drug. It slows the growth of cancer cells. This medicine is used to treat many types of cancer like lymphoma, myeloma, leukemia, breast cancer, and ovarian cancer, to name a few. This medicine may be used for other purposes; ask your health care provider or pharmacist if you have questions. COMMON BRAND NAME(S): Cytoxan, Neosar What should I tell my health care provider before I take this medicine? They need to know if you have any of these conditions: -blood disorders -history of other chemotherapy -infection -kidney disease -liver disease -recent or ongoing radiation therapy -tumors in the bone marrow -an unusual or allergic reaction to cyclophosphamide, other chemotherapy, other medicines, foods, dyes, or preservatives -pregnant or trying to get pregnant -breast-feeding How should I use this medicine? This drug is usually given as an injection into a vein or muscle or by infusion into a vein. It is administered in a hospital or clinic by a specially trained health care professional. Talk to your pediatrician regarding the use of this medicine in children. Special care may be needed. Overdosage: If you think you have taken too much of this medicine contact a poison control center or emergency room at once. NOTE: This medicine is only for you. Do not share this medicine with others. What if I miss a dose? It is important not to miss your dose. Call your doctor or health care professional if you are unable to keep an appointment. What may interact with this medicine? This medicine may interact with the following medications: -amiodarone -amphotericin B -azathioprine -certain antiviral medicines for HIV or AIDS such as protease inhibitors (e.g., indinavir, ritonavir) and zidovudine -certain blood pressure medications such as benazepril, captopril, enalapril, fosinopril,  lisinopril, moexipril, monopril, perindopril, quinapril, ramipril, trandolapril -certain cancer medications such as anthracyclines (e.g., daunorubicin, doxorubicin), busulfan, cytarabine, paclitaxel, pentostatin, tamoxifen, trastuzumab -certain diuretics such as chlorothiazide, chlorthalidone, hydrochlorothiazide, indapamide, metolazone -certain medicines that treat or prevent blood clots like warfarin -certain muscle relaxants such as succinylcholine -cyclosporine -etanercept -indomethacin -medicines to increase blood counts like filgrastim, pegfilgrastim, sargramostim -medicines used as general anesthesia -metronidazole -natalizumab This list may not describe all possible interactions. Give your health care provider a list of all the medicines, herbs, non-prescription drugs, or dietary supplements you use. Also tell them if you smoke, drink alcohol, or use illegal drugs. Some items may interact with your medicine. What should I watch for while using this medicine? Visit your doctor for checks on your progress. This drug may make you feel generally unwell. This is not uncommon, as chemotherapy can affect healthy cells as well as cancer cells. Report any side effects. Continue your course of treatment even though you feel ill unless your doctor tells you to stop. Drink water or other fluids as directed. Urinate often, even at night. In some cases, you may be given additional medicines to help with side effects. Follow all directions for their use. Call your doctor or health care professional for advice if you get a fever, chills or sore throat, or other symptoms of a cold or flu. Do not treat yourself. This drug decreases your body's ability to fight infections. Try to avoid being around people who are sick. This medicine may increase your risk to bruise or bleed. Call your doctor or health care professional if you notice any unusual bleeding. Be careful brushing and flossing your teeth or using a  toothpick because you may get an infection or bleed   more easily. If you have any dental work done, tell your dentist you are receiving this medicine. You may get drowsy or dizzy. Do not drive, use machinery, or do anything that needs mental alertness until you know how this medicine affects you. Do not become pregnant while taking this medicine or for 1 year after stopping it. Women should inform their doctor if they wish to become pregnant or think they might be pregnant. Men should not father a child while taking this medicine and for 4 months after stopping it. There is a potential for serious side effects to an unborn child. Talk to your health care professional or pharmacist for more information. Do not breast-feed an infant while taking this medicine. This medicine may interfere with the ability to have a child. This medicine has caused ovarian failure in some women. This medicine has caused reduced sperm counts in some men. You should talk with your doctor or health care professional if you are concerned about your fertility. If you are going to have surgery, tell your doctor or health care professional that you have taken this medicine. What side effects may I notice from receiving this medicine? Side effects that you should report to your doctor or health care professional as soon as possible: -allergic reactions like skin rash, itching or hives, swelling of the face, lips, or tongue -low blood counts - this medicine may decrease the number of white blood cells, red blood cells and platelets. You may be at increased risk for infections and bleeding. -signs of infection - fever or chills, cough, sore throat, pain or difficulty passing urine -signs of decreased platelets or bleeding - bruising, pinpoint red spots on the skin, black, tarry stools, blood in the urine -signs of decreased red blood cells - unusually weak or tired, fainting spells, lightheadedness -breathing problems -dark  urine -dizziness -palpitations -swelling of the ankles, feet, hands -trouble passing urine or change in the amount of urine -weight gain -yellowing of the eyes or skin Side effects that usually do not require medical attention (report to your doctor or health care professional if they continue or are bothersome): -changes in nail or skin color -hair loss -missed menstrual periods -mouth sores -nausea, vomiting This list may not describe all possible side effects. Call your doctor for medical advice about side effects. You may report side effects to FDA at 1-800-FDA-1088. Where should I keep my medicine? This drug is given in a hospital or clinic and will not be stored at home. NOTE: This sheet is a summary. It may not cover all possible information. If you have questions about this medicine, talk to your doctor, pharmacist, or health care provider.  2019 Elsevier/Gold Standard (2011-12-25 16:22:58) Carfilzomib injection What is this medicine? CARFILZOMIB (kar FILZ oh mib) targets a specific protein within cancer cells and stops the cancer cells from growing. It is used to treat multiple myeloma. This medicine may be used for other purposes; ask your health care provider or pharmacist if you have questions. COMMON BRAND NAME(S): KYPROLIS What should I tell my health care provider before I take this medicine? They need to know if you have any of these conditions: -heart disease -history of blood clots -irregular heartbeat -kidney disease -liver disease -lung or breathing disease -an unusual or allergic reaction to carfilzomib, or other medicines, foods, dyes, or preservatives -pregnant or trying to get pregnant -breast-feeding How should I use this medicine? This medicine is for injection or infusion into a vein. It is given  by a health care professional in a hospital or clinic setting. Talk to your pediatrician regarding the use of this medicine in children. Special care may be  needed. Overdosage: If you think you have taken too much of this medicine contact a poison control center or emergency room at once. NOTE: This medicine is only for you. Do not share this medicine with others. What if I miss a dose? It is important not to miss your dose. Call your doctor or health care professional if you are unable to keep an appointment. What may interact with this medicine? Interactions are not expected. Give your health care provider a list of all the medicines, herbs, non-prescription drugs, or dietary supplements you use. Also tell them if you smoke, drink alcohol, or use illegal drugs. Some items may interact with your medicine. This list may not describe all possible interactions. Give your health care provider a list of all the medicines, herbs, non-prescription drugs, or dietary supplements you use. Also tell them if you smoke, drink alcohol, or use illegal drugs. Some items may interact with your medicine. What should I watch for while using this medicine? Your condition will be monitored carefully while you are receiving this medicine. Report any side effects. Continue your course of treatment even though you feel ill unless your doctor tells you to stop. You may need blood work done while you are taking this medicine. Do not become pregnant while taking this medicine or for at least 6 months after stopping it. Women should inform their doctor if they wish to become pregnant or think they might be pregnant. There is a potential for serious side effects to an unborn child. Men should not father a child while taking this medicine and for at least 3 months after stopping it. Talk to your health care professional or pharmacist for more information. Do not breast-feed an infant while taking this medicine or for 2 weeks after the last dose. Check with your doctor or health care professional if you get an attack of severe diarrhea, nausea and vomiting, or if you sweat a lot. The  loss of too much body fluid can make it dangerous for you to take this medicine. You may get dizzy. Do not drive, use machinery, or do anything that needs mental alertness until you know how this medicine affects you. Do not stand or sit up quickly, especially if you are an older patient. This reduces the risk of dizzy or fainting spells. What side effects may I notice from receiving this medicine? Side effects that you should report to your doctor or health care professional as soon as possible: -allergic reactions like skin rash, itching or hives, swelling of the face, lips, or tongue -confusion -dizziness -feeling faint or lightheaded -fever or chills -palpitations -seizures -signs and symptoms of bleeding such as bloody or black, tarry stools; red or dark-brown urine; spitting up blood or brown material that looks like coffee grounds; red spots on the skin; unusual bruising or bleeding including from the eye, gums, or nose -signs and symptoms of a blood clot such as breathing problems; changes in vision; chest pain; severe, sudden headache; pain, swelling, warmth in the leg; trouble speaking; sudden numbness or weakness of the face, arm or leg -signs and symptoms of kidney injury like trouble passing urine or change in the amount of urine -signs and symptoms of liver injury like dark yellow or brown urine; general ill feeling or flu-like symptoms; light-colored stools; loss of appetite; nausea; right  upper belly pain; unusually weak or tired; yellowing of the eyes or skin Side effects that usually do not require medical attention (report to your doctor or health care professional if they continue or are bothersome): -back pain -cough -diarrhea -headache -muscle cramps -vomiting This list may not describe all possible side effects. Call your doctor for medical advice about side effects. You may report side effects to FDA at 1-800-FDA-1088. Where should I keep my medicine? This drug is  given in a hospital or clinic and will not be stored at home. NOTE: This sheet is a summary. It may not cover all possible information. If you have questions about this medicine, talk to your doctor, pharmacist, or health care provider.  2019 Elsevier/Gold Standard (2016-11-25 14:07:13)

## 2018-04-25 NOTE — Progress Notes (Signed)
Ok to treat with creatinine of 2.39 per Dr [name]. dph

## 2018-04-25 NOTE — Progress Notes (Signed)
Hematology and Oncology Follow Up Visit  [name] [id number] 1950-12-17 68 y.o. 04/25/2018   Principle Diagnosis:  IgG kappa myeloma Anemia secondary to renal insufficiency Intermittent iron - deficiency anemia Hypotestosteronemia  Current Therapy:   Aredia 60 mg IV q 3 months-- next dose 06/2018 Aranesp 300 mcg subcu as needed for hemoglobin less than 11 DepoTestosterone400 mg q 3 weeks IV iron as indicated Kyprolis/Cytoxan - s/p cycle#16   Interim History:  [name] is here today for follow-up and treatment.  He had a very nice weekend.  He is doing pretty well.  He does have his medical issues.  He has the diabetes.  He has the rheumatism.  As far as the myeloma is concerned, he is done incredibly well.  More last saw him, his M spike was 0.2 g/dL.  His IgG level was 783 mg/dL.  His kappa light chain was 2.2 mg/dL.  He also gets testosterone.  He does well with the testosterone.  His last testosterone level back in December was almost 1300.  His iron studies that we did a month ago showed a ferritin of 1000 and iron saturation of 25%.  His appetite has been good.  He is trying to watch what he eats because of the diabetes.  Overall, his performance status is ECOG 1.   Medications:  Allergies as of 04/25/2018      Reactions   Iodinated Diagnostic Agents Rash   Patient states he was instructed not to take IV contrast.  In 2008 he had an unknown reaction, and was told not to take it again.  He was also told not to take it due to his kidneys.   Iodine Anxiety, Rash, Other (See Comments)   Didn't feel right "instructed not to take per MD--something with his port"      Medication List       Accurate as of April 25, 2018  8:32 AM. Always use your most recent med list.        amLODipine 10 MG tablet Commonly known as:  NORVASC Take 10 mg by mouth daily.   CARAFATE 1 GM/10ML suspension Generic drug:  sucralfate Take 1 g by mouth 4 (four) times daily.     cefdinir 300 MG capsule Commonly known as:  OMNICEF Take 1 capsule (300 mg total) by mouth 2 (two) times daily.   ciprofloxacin 500 MG tablet Commonly known as:  CIPRO Take 1 tablet (500 mg total) by mouth every 12 (twelve) hours.   CLEAR EYES ALL SEASONS 5-6 MG/ML Soln Generic drug:  Polyvinyl Alcohol-Povidone Place 2 drops into both eyes 3 (three) times daily as needed (drye eyes.).   cyclobenzaprine 10 MG tablet Commonly known as:  FLEXERIL Take 10 mg by mouth 3 (three) times daily.   dicyclomine 10 MG capsule Commonly known as:  BENTYL TAKE ONE CAPSULE BY MOUTH 3 TIMES A DAY   dronabinol 2.5 MG capsule Commonly known as:  MARINOL Take 1 capsule (2.5 mg total) by mouth 2 (two) times daily before a meal.   EMBEDA 30-1.2 MG Cpcr Generic drug:  Morphine-Naltrexone Take 3 (three) times daily by mouth.   EMBEDA 20-0.8 MG Cpcr Generic drug:  Morphine-Naltrexone Take 1 capsule by mouth 3 (three) times daily.   furosemide 40 MG tablet Commonly known as:  LASIX Take 40 mg by mouth.   glipiZIDE 5 MG tablet Commonly known as:  GLUCOTROL Take 5 mg by mouth 2 (two) times daily before a meal.   HYDROcodone-acetaminophen 5-325 MG  tablet Commonly known as:  NORCO Take 1-2 tablets by mouth every 6 (six) hours as needed.   lactulose 10 GM/15ML solution Commonly known as:  CHRONULAC TAKE 30 MILLILITERS BY MOUTH ONCE DAILY   latanoprost 0.005 % ophthalmic solution Commonly known as:  XALATAN Place 1 drop into the right eye every evening.   lidocaine-prilocaine cream Commonly known as:  EMLA Apply 1 application topically as needed.   linaclotide 145 MCG Caps capsule Commonly known as:  LINZESS Take 1 capsule (145 mcg total) by mouth daily before breakfast.   LORazepam 0.5 MG tablet Commonly known as:  ATIVAN Take 1 tablet (0.5 mg total) by mouth every 6 (six) hours as needed (Nausea or vomiting).   losartan 25 MG tablet Commonly known as:  COZAAR Take 25 mg by  mouth.   metoprolol succinate 100 MG 24 hr tablet Commonly known as:  TOPROL-XL Take 100 mg by mouth daily.   mirabegron ER 50 MG Tb24 tablet Commonly known as:  MYRBETRIQ Take 50 mg by mouth daily.   omeprazole 40 MG capsule Commonly known as:  PRILOSEC TAKE 1 CAPSULE BY MOUTH TWICE A DAY   ondansetron 8 MG tablet Commonly known as:  ZOFRAN TAKE 1 TABLET(8 MG) BY MOUTH TWICE DAILY AS NEEDED FOR NAUSEA OR VOMITING   orphenadrine 100 MG tablet Commonly known as:  NORFLEX Take 1 tablet (100 mg total) by mouth 2 (two) times daily.   oxyCODONE-acetaminophen 10-325 MG tablet Commonly known as:  PERCOCET Take 1 tablet by mouth every 6 (six) hours as needed for pain.   pioglitazone 30 MG tablet Commonly known as:  ACTOS Take 30 mg by mouth daily.   potassium chloride SA 20 MEQ tablet Commonly known as:  K-DUR,KLOR-CON Take 1 tablet (20 mEq total) by mouth 2 (two) times daily.   prochlorperazine 10 MG tablet Commonly known as:  COMPAZINE take 1 tablet by mouth every 6 hours if needed for nausea and vomiting   RELISTOR 150 MG Tabs Generic drug:  Methylnaltrexone Bromide Take 150 mg by mouth.   Sennosides 25 MG Tabs Take 25 mg by mouth daily as needed (constipation).   Vitamin D (Ergocalciferol) 1.25 MG (50000 UT) Caps capsule Commonly known as:  DRISDOL Take 50,000 Units by mouth every Saturday.   ZIOPTAN 0.0015 % Soln Generic drug:  Tafluprost (PF) Place 1 drop into both eyes daily.       Allergies:  Allergies  Allergen Reactions  . Iodinated Diagnostic Agents Rash    Patient states he was instructed not to take IV contrast.  In 2008 he had an unknown reaction, and was told not to take it again.  He was also told not to take it due to his kidneys.  . Iodine Anxiety, Rash and Other (See Comments)    Didn't feel right "instructed not to take per MD--something with his port"     Past Medical History, Surgical history, Social history, and Family History were  reviewed and updated.  Review of Systems: Review of Systems  Constitutional: Negative.   HENT: Negative.   Eyes: Negative.   Respiratory: Negative.   Cardiovascular: Negative.   Gastrointestinal: Negative.   Genitourinary: Negative.   Musculoskeletal: Positive for back pain, joint pain and myalgias.  Skin: Negative.   Neurological: Negative.   Endo/Heme/Allergies: Negative.   Psychiatric/Behavioral: Negative.      Physical Exam:  weight is 192 lb 12 oz (87.4 kg). His oral temperature is 98.4 F (36.9 C). His blood pressure is 138/66  and his pulse is 95. His respiration is 18 and oxygen saturation is 100%.   Wt Readings from Last 3 Encounters:  04/25/18 192 lb 12 oz (87.4 kg)  03/28/18 195 lb (88.5 kg)  02/28/18 197 lb 12 oz (89.7 kg)    Physical Exam Vitals signs reviewed.  HENT:     Head: Normocephalic and atraumatic.  Eyes:     Pupils: Pupils are equal, round, and reactive to light.  Neck:     Musculoskeletal: Normal range of motion.  Cardiovascular:     Rate and Rhythm: Normal rate and regular rhythm.     Heart sounds: Normal heart sounds.  Pulmonary:     Effort: Pulmonary effort is normal.     Breath sounds: Normal breath sounds.  Abdominal:     General: Bowel sounds are normal.     Palpations: Abdomen is soft.  Musculoskeletal: Normal range of motion.        General: No tenderness or deformity.  Lymphadenopathy:     Cervical: No cervical adenopathy.  Skin:    General: Skin is warm and dry.     Findings: No erythema or rash.  Neurological:     Mental Status: He is alert and oriented to person, place, and time.  Psychiatric:        Behavior: Behavior normal.        Thought Content: Thought content normal.        Judgment: Judgment normal.      Lab Results  Component Value Date   WBC 6.0 04/11/2018   HGB 10.1 (L) 04/11/2018   HCT 32.4 (L) 04/11/2018   MCV 81.4 04/11/2018   PLT 164 04/11/2018   Lab Results  Component Value Date   FERRITIN  1,074 (H) 03/28/2018   IRON 60 03/28/2018   TIBC 239 03/28/2018   UIBC 179 03/28/2018   IRONPCTSAT 25 03/28/2018   Lab Results  Component Value Date   RETICCTPCT 0.7 03/28/2018   RBC 3.98 (L) 04/11/2018   RETICCTABS 27.5 03/21/2014   Lab Results  Component Value Date   KPAFRELGTCHN 21.8 (H) 03/28/2018   LAMBDASER 15.1 03/28/2018   KAPLAMBRATIO 1.44 03/28/2018   Lab Results  Component Value Date   IGGSERUM 783 03/28/2018   IGA 135 03/28/2018   IGMSERUM 22 03/28/2018   Lab Results  Component Value Date   TOTALPROTELP 6.5 03/28/2018   ALBUMINELP 3.7 03/28/2018   A1GS 0.2 03/28/2018   A2GS 1.1 (H) 03/28/2018   BETS 0.8 03/28/2018   BETA2SER 0.5 11/07/2014   GAMS 0.6 03/28/2018   MSPIKE 0.2 (H) 03/28/2018   SPEI Comment 03/28/2018     Chemistry      Component Value Date/Time   NA 139 04/11/2018 0755   NA 143 02/22/2017 0744   K 3.8 04/11/2018 0755   K 4.0 02/22/2017 0744   CL 104 04/11/2018 0755   CL 106 02/22/2017 0744   CO2 25 04/11/2018 0755   CO2 27 02/22/2017 0744   BUN 20 04/11/2018 0755   BUN 26 (H) 02/22/2017 0744   CREATININE 2.36 (H) 04/11/2018 0755   CREATININE 2.30 (H) 04/04/2018 0800   CREATININE 2.3 (H) 02/22/2017 0744      Component Value Date/Time   CALCIUM 9.2 04/11/2018 0755   CALCIUM 9.3 02/22/2017 0744   ALKPHOS 56 04/11/2018 0755   ALKPHOS 80 02/22/2017 0744   AST 14 (L) 04/11/2018 0755   AST 15 04/04/2018 0800   ALT 15 04/11/2018 0755   ALT 13 04/04/2018  0800   ALT 22 02/22/2017 0744   BILITOT 0.3 04/11/2018 0755   BILITOT 0.3 04/04/2018 0800      Impression and Plan: [name] is a very pleasant 68 yo African American gentleman with IgG kappa myeloma.   Overall, I really think he is done quite well.  His myeloma studies really have been holding steady.  This I think is what we have to watch out for.  He has had no problems with the Kyprolis and Cytoxan combination.  The testosterone is helping him.  His quality of life is  much better.  He has a girlfriend which he is able to enjoy.  I will have him come back in another month.  I think this is reasonable.    Volanda Napoleon, MD 3/2/20208:32 AM

## 2018-04-26 LAB — IGG, IGA, IGM
IgA: 145 mg/dL (ref 61–437)
IgG (Immunoglobin G), Serum: 740 mg/dL (ref 700–1600)
IgM (Immunoglobulin M), Srm: 20 mg/dL (ref 20–172)

## 2018-04-26 LAB — KAPPA/LAMBDA LIGHT CHAINS
KAPPA FREE LGHT CHN: 27.2 mg/L — AB (ref 3.3–19.4)
Kappa, lambda light chain ratio: 1.46 (ref 0.26–1.65)
Lambda free light chains: 18.6 mg/L (ref 5.7–26.3)

## 2018-04-27 LAB — PROTEIN ELECTROPHORESIS, SERUM, WITH REFLEX
A/G Ratio: 1.2 (ref 0.7–1.7)
Albumin ELP: 3.4 g/dL (ref 2.9–4.4)
Alpha-1-Globulin: 0.2 g/dL (ref 0.0–0.4)
Alpha-2-Globulin: 1.1 g/dL — ABNORMAL HIGH (ref 0.4–1.0)
Beta Globulin: 0.8 g/dL (ref 0.7–1.3)
Gamma Globulin: 0.6 g/dL (ref 0.4–1.8)
Globulin, Total: 2.8 g/dL (ref 2.2–3.9)
Total Protein ELP: 6.2 g/dL (ref 6.0–8.5)

## 2018-04-29 ENCOUNTER — Other Ambulatory Visit: Payer: Self-pay | Admitting: Family

## 2018-04-29 DIAGNOSIS — D631 Anemia in chronic kidney disease: Secondary | ICD-10-CM

## 2018-04-29 DIAGNOSIS — C9000 Multiple myeloma not having achieved remission: Secondary | ICD-10-CM

## 2018-04-29 DIAGNOSIS — N189 Chronic kidney disease, unspecified: Principal | ICD-10-CM

## 2018-05-02 ENCOUNTER — Inpatient Hospital Stay: Payer: Medicare Other

## 2018-05-02 ENCOUNTER — Other Ambulatory Visit: Payer: Self-pay

## 2018-05-02 VITALS — BP 138/78 | HR 89

## 2018-05-02 DIAGNOSIS — D631 Anemia in chronic kidney disease: Secondary | ICD-10-CM

## 2018-05-02 DIAGNOSIS — C9000 Multiple myeloma not having achieved remission: Secondary | ICD-10-CM

## 2018-05-02 DIAGNOSIS — N189 Chronic kidney disease, unspecified: Principal | ICD-10-CM

## 2018-05-02 DIAGNOSIS — E349 Endocrine disorder, unspecified: Secondary | ICD-10-CM

## 2018-05-02 DIAGNOSIS — Z5112 Encounter for antineoplastic immunotherapy: Secondary | ICD-10-CM | POA: Diagnosis not present

## 2018-05-02 DIAGNOSIS — Z299 Encounter for prophylactic measures, unspecified: Secondary | ICD-10-CM

## 2018-05-02 LAB — CMP (CANCER CENTER ONLY)
ALT: 7 U/L (ref 0–44)
AST: 10 U/L — ABNORMAL LOW (ref 15–41)
Albumin: 4 g/dL (ref 3.5–5.0)
Alkaline Phosphatase: 48 U/L (ref 38–126)
Anion gap: 8 (ref 5–15)
BUN: 15 mg/dL (ref 8–23)
CO2: 26 mmol/L (ref 22–32)
Calcium: 8.6 mg/dL — ABNORMAL LOW (ref 8.9–10.3)
Chloride: 107 mmol/L (ref 98–111)
Creatinine: 1.96 mg/dL — ABNORMAL HIGH (ref 0.61–1.24)
GFR, Est AFR Am: 40 mL/min — ABNORMAL LOW (ref >60)
GFR, Estimated: 34 mL/min — ABNORMAL LOW (ref >60)
Glucose, Bld: 160 mg/dL — ABNORMAL HIGH (ref 70–99)
Potassium: 3.5 mmol/L (ref 3.5–5.1)
Sodium: 141 mmol/L (ref 135–145)
Total Bilirubin: 0.4 mg/dL (ref 0.3–1.2)
Total Protein: 6.4 g/dL — ABNORMAL LOW (ref 6.5–8.1)

## 2018-05-02 LAB — CBC WITH DIFFERENTIAL (CANCER CENTER ONLY)
Abs Immature Granulocytes: 0.02 10*3/uL (ref 0.00–0.07)
BASOS ABS: 0 10*3/uL (ref 0.0–0.1)
Basophils Relative: 1 %
EOS PCT: 3 %
Eosinophils Absolute: 0.2 10*3/uL (ref 0.0–0.5)
HCT: 32.1 % — ABNORMAL LOW (ref 39.0–52.0)
Hemoglobin: 10 g/dL — ABNORMAL LOW (ref 13.0–17.0)
Immature Granulocytes: 0 %
Lymphocytes Relative: 13 %
Lymphs Abs: 0.7 10*3/uL (ref 0.7–4.0)
MCH: 25.3 pg — ABNORMAL LOW (ref 26.0–34.0)
MCHC: 31.2 g/dL (ref 30.0–36.0)
MCV: 81.1 fL (ref 80.0–100.0)
Monocytes Absolute: 0.6 10*3/uL (ref 0.1–1.0)
Monocytes Relative: 12 %
Neutro Abs: 3.8 10*3/uL (ref 1.7–7.7)
Neutrophils Relative %: 71 %
Platelet Count: 177 10*3/uL (ref 150–400)
RBC: 3.96 MIL/uL — ABNORMAL LOW (ref 4.22–5.81)
RDW: 19.6 % — ABNORMAL HIGH (ref 11.5–15.5)
WBC Count: 5.4 10*3/uL (ref 4.0–10.5)
nRBC: 0 % (ref 0.0–0.2)

## 2018-05-02 MED ORDER — HYDROMORPHONE HCL 1 MG/ML IJ SOLN
INTRAMUSCULAR | Status: AC
  Filled 2018-05-02: qty 2

## 2018-05-02 MED ORDER — SODIUM CHLORIDE 0.9 % IV SOLN
300.0000 mg/m2 | Freq: Once | INTRAVENOUS | Status: AC
  Administered 2018-05-02: 620 mg via INTRAVENOUS
  Filled 2018-05-02: qty 31

## 2018-05-02 MED ORDER — SODIUM CHLORIDE 0.9% FLUSH
10.0000 mL | INTRAVENOUS | Status: DC | PRN
  Administered 2018-05-02: 10 mL
  Filled 2018-05-02: qty 10

## 2018-05-02 MED ORDER — DEXAMETHASONE SODIUM PHOSPHATE 10 MG/ML IJ SOLN
10.0000 mg | Freq: Once | INTRAMUSCULAR | Status: AC
  Administered 2018-05-02: 10 mg via INTRAVENOUS

## 2018-05-02 MED ORDER — SODIUM CHLORIDE 0.9 % IV SOLN
Freq: Once | INTRAVENOUS | Status: AC
  Administered 2018-05-02: 10:00:00 via INTRAVENOUS
  Filled 2018-05-02: qty 250

## 2018-05-02 MED ORDER — HEPARIN SOD (PORK) LOCK FLUSH 100 UNIT/ML IV SOLN
500.0000 [IU] | Freq: Once | INTRAVENOUS | Status: AC | PRN
  Administered 2018-05-02: 500 [IU]
  Filled 2018-05-02: qty 5

## 2018-05-02 MED ORDER — DEXAMETHASONE SODIUM PHOSPHATE 10 MG/ML IJ SOLN
INTRAMUSCULAR | Status: AC
  Filled 2018-05-02: qty 1

## 2018-05-02 MED ORDER — SODIUM CHLORIDE 0.9 % IV SOLN
Freq: Once | INTRAVENOUS | Status: AC
  Filled 2018-05-02: qty 250

## 2018-05-02 MED ORDER — HYDROMORPHONE HCL 1 MG/ML IJ SOLN
2.0000 mg | Freq: Once | INTRAMUSCULAR | Status: AC
  Administered 2018-05-02: 2 mg via INTRAVENOUS

## 2018-05-02 MED ORDER — DEXTROSE 5 % IV SOLN
29.0000 mg/m2 | Freq: Once | INTRAVENOUS | Status: AC
  Administered 2018-05-02: 60 mg via INTRAVENOUS
  Filled 2018-05-02: qty 30

## 2018-05-02 MED ORDER — PALONOSETRON HCL INJECTION 0.25 MG/5ML
INTRAVENOUS | Status: AC
  Filled 2018-05-02: qty 5

## 2018-05-02 MED ORDER — PALONOSETRON HCL INJECTION 0.25 MG/5ML
0.2500 mg | Freq: Once | INTRAVENOUS | Status: AC
  Administered 2018-05-02: 0.25 mg via INTRAVENOUS

## 2018-05-02 NOTE — Patient Instructions (Signed)
Carfilzomib injection What is this medicine? CARFILZOMIB (kar FILZ oh mib) targets a specific protein within cancer cells and stops the cancer cells from growing. It is used to treat multiple myeloma. This medicine may be used for other purposes; ask your health care provider or pharmacist if you have questions. COMMON BRAND NAME(S): KYPROLIS What should I tell my health care provider before I take this medicine? They need to know if you have any of these conditions: -heart disease -history of blood clots -irregular heartbeat -kidney disease -liver disease -lung or breathing disease -an unusual or allergic reaction to carfilzomib, or other medicines, foods, dyes, or preservatives -pregnant or trying to get pregnant -breast-feeding How should I use this medicine? This medicine is for injection or infusion into a vein. It is given by a health care professional in a hospital or clinic setting. Talk to your pediatrician regarding the use of this medicine in children. Special care may be needed. Overdosage: If you think you have taken too much of this medicine contact a poison control center or emergency room at once. NOTE: This medicine is only for you. Do not share this medicine with others. What if I miss a dose? It is important not to miss your dose. Call your doctor or health care professional if you are unable to keep an appointment. What may interact with this medicine? Interactions are not expected. Give your health care provider a list of all the medicines, herbs, non-prescription drugs, or dietary supplements you use. Also tell them if you smoke, drink alcohol, or use illegal drugs. Some items may interact with your medicine. This list may not describe all possible interactions. Give your health care provider a list of all the medicines, herbs, non-prescription drugs, or dietary supplements you use. Also tell them if you smoke, drink alcohol, or use illegal drugs. Some items may  interact with your medicine. What should I watch for while using this medicine? Your condition will be monitored carefully while you are receiving this medicine. Report any side effects. Continue your course of treatment even though you feel ill unless your doctor tells you to stop. You may need blood work done while you are taking this medicine. Do not become pregnant while taking this medicine or for at least 6 months after stopping it. Women should inform their doctor if they wish to become pregnant or think they might be pregnant. There is a potential for serious side effects to an unborn child. Men should not father a child while taking this medicine and for at least 3 months after stopping it. Talk to your health care professional or pharmacist for more information. Do not breast-feed an infant while taking this medicine or for 2 weeks after the last dose. Check with your doctor or health care professional if you get an attack of severe diarrhea, nausea and vomiting, or if you sweat a lot. The loss of too much body fluid can make it dangerous for you to take this medicine. You may get dizzy. Do not drive, use machinery, or do anything that needs mental alertness until you know how this medicine affects you. Do not stand or sit up quickly, especially if you are an older patient. This reduces the risk of dizzy or fainting spells. What side effects may I notice from receiving this medicine? Side effects that you should report to your doctor or health care professional as soon as possible: -allergic reactions like skin rash, itching or hives, swelling of the face, lips, or  tongue -confusion -dizziness -feeling faint or lightheaded -fever or chills -palpitations -seizures -signs and symptoms of bleeding such as bloody or black, tarry stools; red or dark-brown urine; spitting up blood or brown material that looks like coffee grounds; red spots on the skin; unusual bruising or bleeding including from  the eye, gums, or nose -signs and symptoms of a blood clot such as breathing problems; changes in vision; chest pain; severe, sudden headache; pain, swelling, warmth in the leg; trouble speaking; sudden numbness or weakness of the face, arm or leg -signs and symptoms of kidney injury like trouble passing urine or change in the amount of urine -signs and symptoms of liver injury like dark yellow or brown urine; general ill feeling or flu-like symptoms; light-colored stools; loss of appetite; nausea; right upper belly pain; unusually weak or tired; yellowing of the eyes or skin Side effects that usually do not require medical attention (report to your doctor or health care professional if they continue or are bothersome): -back pain -cough -diarrhea -headache -muscle cramps -vomiting This list may not describe all possible side effects. Call your doctor for medical advice about side effects. You may report side effects to FDA at 1-800-FDA-1088. Where should I keep my medicine? This drug is given in a hospital or clinic and will not be stored at home. NOTE: This sheet is a summary. It may not cover all possible information. If you have questions about this medicine, talk to your doctor, pharmacist, or health care provider.  2019 Elsevier/Gold Standard (2016-11-25 14:07:13) Cyclophosphamide injection What is this medicine? CYCLOPHOSPHAMIDE (sye kloe FOSS fa mide) is a chemotherapy drug. It slows the growth of cancer cells. This medicine is used to treat many types of cancer like lymphoma, myeloma, leukemia, breast cancer, and ovarian cancer, to name a few. This medicine may be used for other purposes; ask your health care provider or pharmacist if you have questions. COMMON BRAND NAME(S): Cytoxan, Neosar What should I tell my health care provider before I take this medicine? They need to know if you have any of these conditions: -blood disorders -history of other  chemotherapy -infection -kidney disease -liver disease -recent or ongoing radiation therapy -tumors in the bone marrow -an unusual or allergic reaction to cyclophosphamide, other chemotherapy, other medicines, foods, dyes, or preservatives -pregnant or trying to get pregnant -breast-feeding How should I use this medicine? This drug is usually given as an injection into a vein or muscle or by infusion into a vein. It is administered in a hospital or clinic by a specially trained health care professional. Talk to your pediatrician regarding the use of this medicine in children. Special care may be needed. Overdosage: If you think you have taken too much of this medicine contact a poison control center or emergency room at once. NOTE: This medicine is only for you. Do not share this medicine with others. What if I miss a dose? It is important not to miss your dose. Call your doctor or health care professional if you are unable to keep an appointment. What may interact with this medicine? This medicine may interact with the following medications: -amiodarone -amphotericin B -azathioprine -certain antiviral medicines for HIV or AIDS such as protease inhibitors (e.g., indinavir, ritonavir) and zidovudine -certain blood pressure medications such as benazepril, captopril, enalapril, fosinopril, lisinopril, moexipril, monopril, perindopril, quinapril, ramipril, trandolapril -certain cancer medications such as anthracyclines (e.g., daunorubicin, doxorubicin), busulfan, cytarabine, paclitaxel, pentostatin, tamoxifen, trastuzumab -certain diuretics such as chlorothiazide, chlorthalidone, hydrochlorothiazide, indapamide, metolazone -certain medicines   that treat or prevent blood clots like warfarin -certain muscle relaxants such as succinylcholine -cyclosporine -etanercept -indomethacin -medicines to increase blood counts like filgrastim, pegfilgrastim, sargramostim -medicines used as general  anesthesia -metronidazole -natalizumab This list may not describe all possible interactions. Give your health care provider a list of all the medicines, herbs, non-prescription drugs, or dietary supplements you use. Also tell them if you smoke, drink alcohol, or use illegal drugs. Some items may interact with your medicine. What should I watch for while using this medicine? Visit your doctor for checks on your progress. This drug may make you feel generally unwell. This is not uncommon, as chemotherapy can affect healthy cells as well as cancer cells. Report any side effects. Continue your course of treatment even though you feel ill unless your doctor tells you to stop. Drink water or other fluids as directed. Urinate often, even at night. In some cases, you may be given additional medicines to help with side effects. Follow all directions for their use. Call your doctor or health care professional for advice if you get a fever, chills or sore throat, or other symptoms of a cold or flu. Do not treat yourself. This drug decreases your body's ability to fight infections. Try to avoid being around people who are sick. This medicine may increase your risk to bruise or bleed. Call your doctor or health care professional if you notice any unusual bleeding. Be careful brushing and flossing your teeth or using a toothpick because you may get an infection or bleed more easily. If you have any dental work done, tell your dentist you are receiving this medicine. You may get drowsy or dizzy. Do not drive, use machinery, or do anything that needs mental alertness until you know how this medicine affects you. Do not become pregnant while taking this medicine or for 1 year after stopping it. Women should inform their doctor if they wish to become pregnant or think they might be pregnant. Men should not father a child while taking this medicine and for 4 months after stopping it. There is a potential for serious side  effects to an unborn child. Talk to your health care professional or pharmacist for more information. Do not breast-feed an infant while taking this medicine. This medicine may interfere with the ability to have a child. This medicine has caused ovarian failure in some women. This medicine has caused reduced sperm counts in some men. You should talk with your doctor or health care professional if you are concerned about your fertility. If you are going to have surgery, tell your doctor or health care professional that you have taken this medicine. What side effects may I notice from receiving this medicine? Side effects that you should report to your doctor or health care professional as soon as possible: -allergic reactions like skin rash, itching or hives, swelling of the face, lips, or tongue -low blood counts - this medicine may decrease the number of white blood cells, red blood cells and platelets. You may be at increased risk for infections and bleeding. -signs of infection - fever or chills, cough, sore throat, pain or difficulty passing urine -signs of decreased platelets or bleeding - bruising, pinpoint red spots on the skin, black, tarry stools, blood in the urine -signs of decreased red blood cells - unusually weak or tired, fainting spells, lightheadedness -breathing problems -dark urine -dizziness -palpitations -swelling of the ankles, feet, hands -trouble passing urine or change in the amount of urine -weight gain -  yellowing of the eyes or skin Side effects that usually do not require medical attention (report to your doctor or health care professional if they continue or are bothersome): -changes in nail or skin color -hair loss -missed menstrual periods -mouth sores -nausea, vomiting This list may not describe all possible side effects. Call your doctor for medical advice about side effects. You may report side effects to FDA at 1-800-FDA-1088. Where should I keep my  medicine? This drug is given in a hospital or clinic and will not be stored at home. NOTE: This sheet is a summary. It may not cover all possible information. If you have questions about this medicine, talk to your doctor, pharmacist, or health care provider.  2019 Elsevier/Gold Standard (2011-12-25 16:22:58)  

## 2018-05-02 NOTE — Progress Notes (Signed)
OK to treat with creatinine of 1.96 per Dr [name]. dph

## 2018-05-06 ENCOUNTER — Other Ambulatory Visit: Payer: Self-pay | Admitting: *Deleted

## 2018-05-06 DIAGNOSIS — C9000 Multiple myeloma not having achieved remission: Secondary | ICD-10-CM

## 2018-05-09 ENCOUNTER — Other Ambulatory Visit: Payer: Self-pay

## 2018-05-09 ENCOUNTER — Inpatient Hospital Stay: Payer: Medicare Other

## 2018-05-09 VITALS — BP 136/73 | HR 80 | Temp 98.1°F | Resp 16

## 2018-05-09 DIAGNOSIS — N189 Chronic kidney disease, unspecified: Principal | ICD-10-CM

## 2018-05-09 DIAGNOSIS — E349 Endocrine disorder, unspecified: Secondary | ICD-10-CM

## 2018-05-09 DIAGNOSIS — Z299 Encounter for prophylactic measures, unspecified: Secondary | ICD-10-CM

## 2018-05-09 DIAGNOSIS — Z5112 Encounter for antineoplastic immunotherapy: Secondary | ICD-10-CM | POA: Diagnosis not present

## 2018-05-09 DIAGNOSIS — C9000 Multiple myeloma not having achieved remission: Secondary | ICD-10-CM

## 2018-05-09 DIAGNOSIS — M4712 Other spondylosis with myelopathy, cervical region: Secondary | ICD-10-CM

## 2018-05-09 DIAGNOSIS — D631 Anemia in chronic kidney disease: Secondary | ICD-10-CM

## 2018-05-09 LAB — CMP (CANCER CENTER ONLY)
ALT: 10 U/L (ref 0–44)
AST: 14 U/L — ABNORMAL LOW (ref 15–41)
Albumin: 4.3 g/dL (ref 3.5–5.0)
Alkaline Phosphatase: 57 U/L (ref 38–126)
Anion gap: 9 (ref 5–15)
BUN: 20 mg/dL (ref 8–23)
CO2: 29 mmol/L (ref 22–32)
Calcium: 8.9 mg/dL (ref 8.9–10.3)
Chloride: 103 mmol/L (ref 98–111)
Creatinine: 2.02 mg/dL — ABNORMAL HIGH (ref 0.61–1.24)
GFR, Est AFR Am: 38 mL/min — ABNORMAL LOW (ref >60)
GFR, Estimated: 33 mL/min — ABNORMAL LOW (ref >60)
Glucose, Bld: 119 mg/dL — ABNORMAL HIGH (ref 70–99)
POTASSIUM: 3.8 mmol/L (ref 3.5–5.1)
Sodium: 141 mmol/L (ref 135–145)
Total Bilirubin: 0.4 mg/dL (ref 0.3–1.2)
Total Protein: 6.8 g/dL (ref 6.5–8.1)

## 2018-05-09 LAB — CBC WITH DIFFERENTIAL (CANCER CENTER ONLY)
Abs Immature Granulocytes: 0.01 10*3/uL (ref 0.00–0.07)
Basophils Absolute: 0 10*3/uL (ref 0.0–0.1)
Basophils Relative: 1 %
EOS ABS: 0.2 10*3/uL (ref 0.0–0.5)
Eosinophils Relative: 3 %
HCT: 32.7 % — ABNORMAL LOW (ref 39.0–52.0)
Hemoglobin: 10.2 g/dL — ABNORMAL LOW (ref 13.0–17.0)
Immature Granulocytes: 0 %
Lymphocytes Relative: 9 %
Lymphs Abs: 0.5 10*3/uL — ABNORMAL LOW (ref 0.7–4.0)
MCH: 25.2 pg — AB (ref 26.0–34.0)
MCHC: 31.2 g/dL (ref 30.0–36.0)
MCV: 80.7 fL (ref 80.0–100.0)
MONO ABS: 0.7 10*3/uL (ref 0.1–1.0)
Monocytes Relative: 13 %
Neutro Abs: 3.9 10*3/uL (ref 1.7–7.7)
Neutrophils Relative %: 74 %
Platelet Count: 152 10*3/uL (ref 150–400)
RBC: 4.05 MIL/uL — ABNORMAL LOW (ref 4.22–5.81)
RDW: 19.4 % — AB (ref 11.5–15.5)
WBC Count: 5.2 10*3/uL (ref 4.0–10.5)
nRBC: 0 % (ref 0.0–0.2)

## 2018-05-09 MED ORDER — DEXAMETHASONE SODIUM PHOSPHATE 10 MG/ML IJ SOLN
10.0000 mg | Freq: Once | INTRAMUSCULAR | Status: AC
  Administered 2018-05-09: 10 mg via INTRAVENOUS

## 2018-05-09 MED ORDER — SODIUM CHLORIDE 0.9 % IV SOLN
Freq: Once | INTRAVENOUS | Status: AC
  Filled 2018-05-09: qty 250

## 2018-05-09 MED ORDER — HEPARIN SOD (PORK) LOCK FLUSH 100 UNIT/ML IV SOLN
500.0000 [IU] | Freq: Once | INTRAVENOUS | Status: DC | PRN
  Filled 2018-05-09: qty 5

## 2018-05-09 MED ORDER — HEPARIN SOD (PORK) LOCK FLUSH 100 UNIT/ML IV SOLN
500.0000 [IU] | Freq: Once | INTRAVENOUS | Status: AC | PRN
  Administered 2018-05-09: 500 [IU]
  Filled 2018-05-09: qty 5

## 2018-05-09 MED ORDER — PALONOSETRON HCL INJECTION 0.25 MG/5ML
0.2500 mg | Freq: Once | INTRAVENOUS | Status: AC
  Administered 2018-05-09: 0.25 mg via INTRAVENOUS

## 2018-05-09 MED ORDER — SODIUM CHLORIDE 0.9% FLUSH
10.0000 mL | INTRAVENOUS | Status: DC | PRN
  Administered 2018-05-09: 10 mL
  Filled 2018-05-09: qty 10

## 2018-05-09 MED ORDER — SODIUM CHLORIDE 0.9 % IV SOLN
Freq: Once | INTRAVENOUS | Status: AC
  Administered 2018-05-09: 09:00:00 via INTRAVENOUS
  Filled 2018-05-09: qty 250

## 2018-05-09 MED ORDER — SODIUM CHLORIDE 0.9 % IV SOLN
300.0000 mg/m2 | Freq: Once | INTRAVENOUS | Status: AC
  Administered 2018-05-09: 620 mg via INTRAVENOUS
  Filled 2018-05-09: qty 31

## 2018-05-09 MED ORDER — DEXTROSE 5 % IV SOLN
29.0000 mg/m2 | Freq: Once | INTRAVENOUS | Status: AC
  Administered 2018-05-09: 60 mg via INTRAVENOUS
  Filled 2018-05-09: qty 30

## 2018-05-09 MED ORDER — HYDROMORPHONE HCL 1 MG/ML IJ SOLN
INTRAMUSCULAR | Status: AC
  Filled 2018-05-09: qty 2

## 2018-05-09 MED ORDER — HYDROMORPHONE HCL 1 MG/ML IJ SOLN
2.0000 mg | Freq: Once | INTRAMUSCULAR | Status: AC
  Administered 2018-05-09: 2 mg via INTRAVENOUS

## 2018-05-09 MED ORDER — DEXAMETHASONE SODIUM PHOSPHATE 10 MG/ML IJ SOLN
INTRAMUSCULAR | Status: AC
  Filled 2018-05-09: qty 1

## 2018-05-09 MED ORDER — PALONOSETRON HCL INJECTION 0.25 MG/5ML
INTRAVENOUS | Status: AC
  Filled 2018-05-09: qty 5

## 2018-05-09 NOTE — Progress Notes (Signed)
OK to treat with creatinine of 2.02 per Dr [name]. dph

## 2018-05-09 NOTE — Patient Instructions (Signed)

## 2018-05-16 ENCOUNTER — Other Ambulatory Visit: Payer: Self-pay | Admitting: Family

## 2018-05-16 DIAGNOSIS — K219 Gastro-esophageal reflux disease without esophagitis: Secondary | ICD-10-CM

## 2018-05-23 ENCOUNTER — Inpatient Hospital Stay (HOSPITAL_BASED_OUTPATIENT_CLINIC_OR_DEPARTMENT_OTHER): Payer: Medicare Other | Admitting: Hematology & Oncology

## 2018-05-23 ENCOUNTER — Inpatient Hospital Stay: Payer: Medicare Other

## 2018-05-23 ENCOUNTER — Other Ambulatory Visit: Payer: Self-pay

## 2018-05-23 ENCOUNTER — Telehealth: Payer: Self-pay | Admitting: *Deleted

## 2018-05-23 VITALS — BP 165/68 | HR 87 | Temp 98.4°F | Resp 19 | Wt 196.0 lb

## 2018-05-23 DIAGNOSIS — D631 Anemia in chronic kidney disease: Secondary | ICD-10-CM | POA: Diagnosis not present

## 2018-05-23 DIAGNOSIS — N189 Chronic kidney disease, unspecified: Secondary | ICD-10-CM

## 2018-05-23 DIAGNOSIS — E119 Type 2 diabetes mellitus without complications: Secondary | ICD-10-CM

## 2018-05-23 DIAGNOSIS — E349 Endocrine disorder, unspecified: Secondary | ICD-10-CM

## 2018-05-23 DIAGNOSIS — D508 Other iron deficiency anemias: Secondary | ICD-10-CM

## 2018-05-23 DIAGNOSIS — C9000 Multiple myeloma not having achieved remission: Secondary | ICD-10-CM

## 2018-05-23 DIAGNOSIS — D509 Iron deficiency anemia, unspecified: Secondary | ICD-10-CM

## 2018-05-23 DIAGNOSIS — Z5112 Encounter for antineoplastic immunotherapy: Secondary | ICD-10-CM | POA: Diagnosis not present

## 2018-05-23 DIAGNOSIS — Z299 Encounter for prophylactic measures, unspecified: Secondary | ICD-10-CM

## 2018-05-23 DIAGNOSIS — M4712 Other spondylosis with myelopathy, cervical region: Secondary | ICD-10-CM

## 2018-05-23 LAB — CBC WITH DIFFERENTIAL (CANCER CENTER ONLY)
Abs Immature Granulocytes: 0.08 10*3/uL — ABNORMAL HIGH (ref 0.00–0.07)
BASOS PCT: 0 %
Basophils Absolute: 0 10*3/uL (ref 0.0–0.1)
Eosinophils Absolute: 0.1 10*3/uL (ref 0.0–0.5)
Eosinophils Relative: 1 %
HCT: 28.8 % — ABNORMAL LOW (ref 39.0–52.0)
Hemoglobin: 9.2 g/dL — ABNORMAL LOW (ref 13.0–17.0)
Immature Granulocytes: 1 %
Lymphocytes Relative: 4 %
Lymphs Abs: 0.5 10*3/uL — ABNORMAL LOW (ref 0.7–4.0)
MCH: 25.9 pg — ABNORMAL LOW (ref 26.0–34.0)
MCHC: 31.9 g/dL (ref 30.0–36.0)
MCV: 81.1 fL (ref 80.0–100.0)
Monocytes Absolute: 0.9 10*3/uL (ref 0.1–1.0)
Monocytes Relative: 7 %
Neutro Abs: 10.5 10*3/uL — ABNORMAL HIGH (ref 1.7–7.7)
Neutrophils Relative %: 87 %
Platelet Count: 133 10*3/uL — ABNORMAL LOW (ref 150–400)
RBC: 3.55 MIL/uL — ABNORMAL LOW (ref 4.22–5.81)
RDW: 18.7 % — ABNORMAL HIGH (ref 11.5–15.5)
WBC Count: 12 10*3/uL — ABNORMAL HIGH (ref 4.0–10.5)
nRBC: 0 % (ref 0.0–0.2)

## 2018-05-23 LAB — CMP (CANCER CENTER ONLY)
ALT: 10 U/L (ref 0–44)
ANION GAP: 8 (ref 5–15)
AST: 10 U/L — ABNORMAL LOW (ref 15–41)
Albumin: 4.1 g/dL (ref 3.5–5.0)
Alkaline Phosphatase: 48 U/L (ref 38–126)
BUN: 21 mg/dL (ref 8–23)
CO2: 27 mmol/L (ref 22–32)
Calcium: 8.5 mg/dL — ABNORMAL LOW (ref 8.9–10.3)
Chloride: 105 mmol/L (ref 98–111)
Creatinine: 2.05 mg/dL — ABNORMAL HIGH (ref 0.61–1.24)
GFR, Est AFR Am: 37 mL/min — ABNORMAL LOW (ref >60)
GFR, Estimated: 32 mL/min — ABNORMAL LOW (ref >60)
GLUCOSE: 119 mg/dL — AB (ref 70–99)
POTASSIUM: 4 mmol/L (ref 3.5–5.1)
Sodium: 140 mmol/L (ref 135–145)
Total Bilirubin: 0.4 mg/dL (ref 0.3–1.2)
Total Protein: 6.5 g/dL (ref 6.5–8.1)

## 2018-05-23 LAB — IRON AND TIBC
Iron: 15 ug/dL — ABNORMAL LOW (ref 42–163)
SATURATION RATIOS: 6 % — AB (ref 20–55)
TIBC: 263 ug/dL (ref 202–409)
UIBC: 247 ug/dL (ref 117–376)

## 2018-05-23 LAB — LACTATE DEHYDROGENASE: LDH: 214 U/L — ABNORMAL HIGH (ref 98–192)

## 2018-05-23 LAB — FERRITIN: Ferritin: 888 ng/mL — ABNORMAL HIGH (ref 24–336)

## 2018-05-23 MED ORDER — FUROSEMIDE 10 MG/ML IJ SOLN
INTRAMUSCULAR | Status: AC
  Filled 2018-05-23: qty 4

## 2018-05-23 MED ORDER — HEPARIN SOD (PORK) LOCK FLUSH 100 UNIT/ML IV SOLN
500.0000 [IU] | Freq: Once | INTRAVENOUS | Status: AC | PRN
  Administered 2018-05-23: 500 [IU]
  Filled 2018-05-23: qty 5

## 2018-05-23 MED ORDER — SODIUM CHLORIDE 0.9 % IV SOLN
Freq: Once | INTRAVENOUS | Status: DC
  Filled 2018-05-23: qty 250

## 2018-05-23 MED ORDER — DEXAMETHASONE SODIUM PHOSPHATE 10 MG/ML IJ SOLN
10.0000 mg | Freq: Once | INTRAMUSCULAR | Status: AC
  Administered 2018-05-23: 10 mg via INTRAVENOUS

## 2018-05-23 MED ORDER — PALONOSETRON HCL INJECTION 0.25 MG/5ML
0.2500 mg | Freq: Once | INTRAVENOUS | Status: AC
  Administered 2018-05-23: 0.25 mg via INTRAVENOUS

## 2018-05-23 MED ORDER — DARBEPOETIN ALFA 300 MCG/0.6ML IJ SOSY
300.0000 ug | PREFILLED_SYRINGE | Freq: Once | INTRAMUSCULAR | Status: AC
  Administered 2018-05-23: 300 ug via SUBCUTANEOUS

## 2018-05-23 MED ORDER — DARBEPOETIN ALFA 300 MCG/0.6ML IJ SOSY
PREFILLED_SYRINGE | INTRAMUSCULAR | Status: AC
  Filled 2018-05-23: qty 0.6

## 2018-05-23 MED ORDER — SODIUM CHLORIDE 0.9 % IV SOLN
Freq: Once | INTRAVENOUS | Status: AC
  Administered 2018-05-23: 10:00:00 via INTRAVENOUS
  Filled 2018-05-23: qty 250

## 2018-05-23 MED ORDER — HYDROMORPHONE HCL 1 MG/ML IJ SOLN
INTRAMUSCULAR | Status: AC
  Filled 2018-05-23: qty 2

## 2018-05-23 MED ORDER — HYDROMORPHONE HCL 1 MG/ML IJ SOLN
2.0000 mg | Freq: Once | INTRAMUSCULAR | Status: AC
  Administered 2018-05-23: 2 mg via INTRAVENOUS

## 2018-05-23 MED ORDER — FUROSEMIDE 10 MG/ML IJ SOLN
40.0000 mg | Freq: Once | INTRAMUSCULAR | Status: AC
  Administered 2018-05-23: 40 mg via INTRAVENOUS

## 2018-05-23 MED ORDER — DEXTROSE 5 % IV SOLN
29.0000 mg/m2 | Freq: Once | INTRAVENOUS | Status: AC
  Administered 2018-05-23: 60 mg via INTRAVENOUS
  Filled 2018-05-23: qty 30

## 2018-05-23 MED ORDER — DEXAMETHASONE SODIUM PHOSPHATE 10 MG/ML IJ SOLN
INTRAMUSCULAR | Status: AC
  Filled 2018-05-23: qty 1

## 2018-05-23 MED ORDER — SODIUM CHLORIDE 0.9 % IV SOLN
300.0000 mg/m2 | Freq: Once | INTRAVENOUS | Status: AC
  Administered 2018-05-23: 620 mg via INTRAVENOUS
  Filled 2018-05-23: qty 31

## 2018-05-23 MED ORDER — PALONOSETRON HCL INJECTION 0.25 MG/5ML
INTRAVENOUS | Status: AC
  Filled 2018-05-23: qty 5

## 2018-05-23 MED ORDER — SODIUM CHLORIDE 0.9% FLUSH
10.0000 mL | INTRAVENOUS | Status: DC | PRN
  Administered 2018-05-23: 10 mL
  Filled 2018-05-23: qty 10

## 2018-05-23 NOTE — Telephone Encounter (Signed)
-----   Message from Volanda Napoleon, MD sent at 05/23/2018  3:28 PM EDT ----- Call - iron is very low!!  Need 2 doses of Injectafer.  Please set up.  pete

## 2018-05-23 NOTE — Telephone Encounter (Signed)
Notified pt of lab results and to expect a call from Scheduling regarding IV Iron. Pt verbalized understanding.

## 2018-05-23 NOTE — Progress Notes (Signed)
Hematology and Oncology Follow Up Visit  [name] [id number] 06/04/1950 68 y.o. 05/23/2018   Principle Diagnosis:  IgG kappa myeloma Anemia secondary to renal insufficiency Intermittent iron - deficiency anemia Hypotestosteronemia  Current Therapy:   Aredia 60 mg IV q 3 months-- next dose 06/2018 Aranesp 300 mcg subcu as needed for hemoglobin less than 11 DepoTestosterone400 mg q 4 weeks IV iron as indicated Kyprolis/Cytoxan - s/p cycle#16   Interim History:  [name] is here today for follow-up and treatment.  He actually is doing fairly well.  He is bothered by this lesion on his left elbow.  Not sure why this is so painful for him.  The elbow does not look swollen or red.  It does not feel warm.  Looks like he has what is like an eschar on the left elbow.  He also has a sebaceous cyst at the top of his back.  He wants to have this removed.  Since he really does not see his family doctor all that much, we will see about getting him to a Psychologist, sport and exercise.  His last monoclonal studies not show an M spike back in early March.  His IgG level was 740 mg/dL.  His kappa light chain was 2.7 mg/dL.  His blood sugars have been under a little bit better control.  This is always been a problem for him.  He has bad fibromyalgia.  He has chronic pain issues.  He does get supplemental testosterone.  His levels have been on the high side.    Overall, his performance status is ECOG 1.   Medications:  Allergies as of 05/23/2018      Reactions   Iodinated Diagnostic Agents Rash   Patient states he was instructed not to take IV contrast.  In 2008 he had an unknown reaction, and was told not to take it again.  He was also told not to take it due to his kidneys.   Iodine Anxiety, Rash, Other (See Comments)   Didn't feel right "instructed not to take per MD--something with his port"      Medication List       Accurate as of May 23, 2018  9:28 AM. Always use your most recent med list.         amLODipine 10 MG tablet Commonly known as:  NORVASC Take 10 mg by mouth daily.   Carafate 1 GM/10ML suspension Generic drug:  sucralfate Take 1 g by mouth 4 (four) times daily.   Clear Eyes All Seasons 5-6 MG/ML Soln Generic drug:  Polyvinyl Alcohol-Povidone Place 2 drops into both eyes 3 (three) times daily as needed (drye eyes.).   cyclobenzaprine 10 MG tablet Commonly known as:  FLEXERIL Take 10 mg by mouth 3 (three) times daily.   dicyclomine 10 MG capsule Commonly known as:  BENTYL TAKE ONE CAPSULE BY MOUTH 3 TIMES A DAY   dronabinol 2.5 MG capsule Commonly known as:  MARINOL Take 1 capsule (2.5 mg total) by mouth 2 (two) times daily before a meal.   Embeda 30-1.2 MG Cpcr Generic drug:  Morphine-Naltrexone Take 3 (three) times daily by mouth.   Embeda 20-0.8 MG Cpcr Generic drug:  Morphine-Naltrexone Take 1 capsule by mouth 3 (three) times daily.   furosemide 40 MG tablet Commonly known as:  LASIX Take 40 mg by mouth.   glipiZIDE 5 MG tablet Commonly known as:  GLUCOTROL Take 5 mg by mouth 2 (two) times daily before a meal.   HYDROcodone-acetaminophen 5-325 MG tablet  Commonly known as:  Norco Take 1-2 tablets by mouth every 6 (six) hours as needed.   lactulose 10 GM/15ML solution Commonly known as:  CHRONULAC TAKE 30 MILLILITERS BY MOUTH ONCE DAILY   latanoprost 0.005 % ophthalmic solution Commonly known as:  XALATAN Place 1 drop into the right eye every evening.   lidocaine-prilocaine cream Commonly known as:  EMLA Apply 1 application topically as needed.   linaclotide 145 MCG Caps capsule Commonly known as:  LINZESS Take 1 capsule (145 mcg total) by mouth daily before breakfast.   LORazepam 0.5 MG tablet Commonly known as:  Ativan Take 1 tablet (0.5 mg total) by mouth every 6 (six) hours as needed (Nausea or vomiting).   losartan 25 MG tablet Commonly known as:  COZAAR Take 25 mg by mouth.   metoprolol succinate 100 MG 24 hr tablet  Commonly known as:  TOPROL-XL Take 100 mg by mouth daily.   mirabegron ER 50 MG Tb24 tablet Commonly known as:  MYRBETRIQ Take 50 mg by mouth daily.   omeprazole 40 MG capsule Commonly known as:  PRILOSEC TAKE 1 CAPSULE BY MOUTH TWICE A DAY   ondansetron 8 MG tablet Commonly known as:  ZOFRAN TAKE 1 TABLET(8 MG) BY MOUTH TWICE DAILY AS NEEDED FOR NAUSEA OR VOMITING   orphenadrine 100 MG tablet Commonly known as:  NORFLEX Take 1 tablet (100 mg total) by mouth 2 (two) times daily.   oxyCODONE-acetaminophen 10-325 MG tablet Commonly known as:  PERCOCET Take 1 tablet by mouth every 6 (six) hours as needed for pain.   pioglitazone 30 MG tablet Commonly known as:  ACTOS Take 30 mg by mouth daily.   potassium chloride SA 20 MEQ tablet Commonly known as:  K-DUR,KLOR-CON Take 1 tablet (20 mEq total) by mouth 2 (two) times daily.   prochlorperazine 10 MG tablet Commonly known as:  COMPAZINE take 1 tablet by mouth every 6 hours if needed for nausea and vomiting   Relistor 150 MG Tabs Generic drug:  Methylnaltrexone Bromide Take 150 mg by mouth.   Sennosides 25 MG Tabs Take 25 mg by mouth daily as needed (constipation).   Vitamin D (Ergocalciferol) 1.25 MG (50000 UT) Caps capsule Commonly known as:  DRISDOL Take 50,000 Units by mouth every Saturday.   Zioptan 0.0015 % Soln Generic drug:  Tafluprost (PF) Place 1 drop into both eyes daily.       Allergies:  Allergies  Allergen Reactions  . Iodinated Diagnostic Agents Rash    Patient states he was instructed not to take IV contrast.  In 2008 he had an unknown reaction, and was told not to take it again.  He was also told not to take it due to his kidneys.  . Iodine Anxiety, Rash and Other (See Comments)    Didn't feel right "instructed not to take per MD--something with his port"     Past Medical History, Surgical history, Social history, and Family History were reviewed and updated.  Review of Systems: Review of  Systems  Constitutional: Negative.   HENT: Negative.   Eyes: Negative.   Respiratory: Negative.   Cardiovascular: Negative.   Gastrointestinal: Negative.   Genitourinary: Negative.   Musculoskeletal: Positive for back pain, joint pain and myalgias.  Skin: Negative.   Neurological: Negative.   Endo/Heme/Allergies: Negative.   Psychiatric/Behavioral: Negative.      Physical Exam:  weight is 196 lb (88.9 kg). His oral temperature is 98.4 F (36.9 C). His blood pressure is 165/68 (abnormal) and his  pulse is 87. His respiration is 19 and oxygen saturation is 100%.   Wt Readings from Last 3 Encounters:  05/23/18 196 lb (88.9 kg)  04/25/18 192 lb 12 oz (87.4 kg)  03/28/18 195 lb (88.5 kg)    Physical Exam Vitals signs reviewed.  HENT:     Head: Normocephalic and atraumatic.  Eyes:     Pupils: Pupils are equal, round, and reactive to light.  Neck:     Musculoskeletal: Normal range of motion.  Cardiovascular:     Rate and Rhythm: Normal rate and regular rhythm.     Heart sounds: Normal heart sounds.  Pulmonary:     Effort: Pulmonary effort is normal.     Breath sounds: Normal breath sounds.  Abdominal:     General: Bowel sounds are normal.     Palpations: Abdomen is soft.  Musculoskeletal: Normal range of motion.        General: No tenderness or deformity.  Lymphadenopathy:     Cervical: No cervical adenopathy.  Skin:    General: Skin is warm and dry.     Findings: No erythema or rash.  Neurological:     Mental Status: He is alert and oriented to person, place, and time.  Psychiatric:        Behavior: Behavior normal.        Thought Content: Thought content normal.        Judgment: Judgment normal.      Lab Results  Component Value Date   WBC 12.0 (H) 05/23/2018   HGB 9.2 (L) 05/23/2018   HCT 28.8 (L) 05/23/2018   MCV 81.1 05/23/2018   PLT 133 (L) 05/23/2018   Lab Results  Component Value Date   FERRITIN 1,015 (H) 04/25/2018   IRON 55 04/25/2018   TIBC  226 04/25/2018   UIBC 171 04/25/2018   IRONPCTSAT 24 04/25/2018   Lab Results  Component Value Date   RETICCTPCT 0.7 03/28/2018   RBC 3.55 (L) 05/23/2018   RETICCTABS 27.5 03/21/2014   Lab Results  Component Value Date   KPAFRELGTCHN 27.2 (H) 04/25/2018   LAMBDASER 18.6 04/25/2018   KAPLAMBRATIO 1.46 04/25/2018   Lab Results  Component Value Date   IGGSERUM 740 04/25/2018   IGA 145 04/25/2018   IGMSERUM 20 04/25/2018   Lab Results  Component Value Date   TOTALPROTELP 6.2 04/25/2018   ALBUMINELP 3.4 04/25/2018   A1GS 0.2 04/25/2018   A2GS 1.1 (H) 04/25/2018   BETS 0.8 04/25/2018   BETA2SER 0.5 11/07/2014   GAMS 0.6 04/25/2018   MSPIKE Not Observed 04/25/2018   SPEI Comment 03/28/2018     Chemistry      Component Value Date/Time   NA 140 05/23/2018 0820   NA 143 02/22/2017 0744   K 4.0 05/23/2018 0820   K 4.0 02/22/2017 0744   CL 105 05/23/2018 0820   CL 106 02/22/2017 0744   CO2 27 05/23/2018 0820   CO2 27 02/22/2017 0744   BUN 21 05/23/2018 0820   BUN 26 (H) 02/22/2017 0744   CREATININE 2.05 (H) 05/23/2018 0820   CREATININE 2.3 (H) 02/22/2017 0744      Component Value Date/Time   CALCIUM 8.5 (L) 05/23/2018 0820   CALCIUM 9.3 02/22/2017 0744   ALKPHOS 48 05/23/2018 0820   ALKPHOS 80 02/22/2017 0744   AST 10 (L) 05/23/2018 0820   ALT 10 05/23/2018 0820   ALT 22 02/22/2017 0744   BILITOT 0.4 05/23/2018 0820      Impression and  Plan: Ms. Savas is a very pleasant 68 yo African American gentleman with IgG kappa myeloma.   Despite all of his health issues, he is holding on fairly well.  He does have quite a bit of fluid in his legs today.  I will give him a dose of Lasix.  As he is little more anemic.  He definitely will get Aranesp today..  I do see what his iron levels look like.  We will plan to get back in another month.  Volanda Napoleon, MD 3/30/20209:28 AM

## 2018-05-23 NOTE — Patient Instructions (Signed)
Implanted Port Insertion, Care After  This sheet gives you information about how to care for yourself after your procedure. Your health care provider may also give you more specific instructions. If you have problems or questions, contact your health care provider.  What can I expect after the procedure?  After the procedure, it is common to have:  · Discomfort at the port insertion site.  · Bruising on the skin over the port. This should improve over 3-4 days.  Follow these instructions at home:  Port care  · After your port is placed, you will get a manufacturer's information card. The card has information about your port. Keep this card with you at all times.  · Take care of the port as told by your health care provider. Ask your health care provider if you or a family member can get training for taking care of the port at home. A home health care nurse may also take care of the port.  · Make sure to remember what type of port you have.  Incision care         · Follow instructions from your health care provider about how to take care of your port insertion site. Make sure you:  ? Wash your hands with soap and water before and after you change your bandage (dressing). If soap and water are not available, use hand sanitizer.  ? Change your dressing as told by your health care provider.  ? Leave stitches (sutures), skin glue, or adhesive strips in place. These skin closures may need to stay in place for 2 weeks or longer. If adhesive strip edges start to loosen and curl up, you may trim the loose edges. Do not remove adhesive strips completely unless your health care provider tells you to do that.  · Check your port insertion site every day for signs of infection. Check for:  ? Redness, swelling, or pain.  ? Fluid or blood.  ? Warmth.  ? Pus or a bad smell.  Activity  · Return to your normal activities as told by your health care provider. Ask your health care provider what activities are safe for you.  · Do not  lift anything that is heavier than 10 lb (4.5 kg), or the limit that you are told, until your health care provider says that it is safe.  General instructions  · Take over-the-counter and prescription medicines only as told by your health care provider.  · Do not take baths, swim, or use a hot tub until your health care provider approves. Ask your health care provider if you may take showers. You may only be allowed to take sponge baths.  · Do not drive for 24 hours if you were given a sedative during your procedure.  · Wear a medical alert bracelet in case of an emergency. This will tell any health care providers that you have a port.  · Keep all follow-up visits as told by your health care provider. This is important.  Contact a health care provider if:  · You cannot flush your port with saline as directed, or you cannot draw blood from the port.  · You have a fever or chills.  · You have redness, swelling, or pain around your port insertion site.  · You have fluid or blood coming from your port insertion site.  · Your port insertion site feels warm to the touch.  · You have pus or a bad smell coming from the port   insertion site.  Get help right away if:  · You have chest pain or shortness of breath.  · You have bleeding from your port that you cannot control.  Summary  · Take care of the port as told by your health care provider. Keep the manufacturer's information card with you at all times.  · Change your dressing as told by your health care provider.  · Contact a health care provider if you have a fever or chills or if you have redness, swelling, or pain around your port insertion site.  · Keep all follow-up visits as told by your health care provider.  This information is not intended to replace advice given to you by your health care provider. Make sure you discuss any questions you have with your health care provider.  Document Released: 11/30/2012 Document Revised: 09/07/2017 Document Reviewed:  09/07/2017  Elsevier Interactive Patient Education © 2019 [employer]

## 2018-05-23 NOTE — Progress Notes (Signed)
OK to treat with creat-2.05 per Dr. [name].

## 2018-05-24 LAB — IGG, IGA, IGM
IgA: 125 mg/dL (ref 61–437)
IgG (Immunoglobin G), Serum: 720 mg/dL (ref 603–1613)
IgM (Immunoglobulin M), Srm: 17 mg/dL — ABNORMAL LOW (ref 20–172)

## 2018-05-24 LAB — PROTEIN ELECTROPHORESIS, SERUM, WITH REFLEX
A/G Ratio: 1.4 (ref 0.7–1.7)
Albumin ELP: 3.4 g/dL (ref 2.9–4.4)
Alpha-1-Globulin: 0.2 g/dL (ref 0.0–0.4)
Alpha-2-Globulin: 0.9 g/dL (ref 0.4–1.0)
Beta Globulin: 0.7 g/dL (ref 0.7–1.3)
Gamma Globulin: 0.5 g/dL (ref 0.4–1.8)
Globulin, Total: 2.4 g/dL (ref 2.2–3.9)
Total Protein ELP: 5.8 g/dL — ABNORMAL LOW (ref 6.0–8.5)

## 2018-05-24 LAB — KAPPA/LAMBDA LIGHT CHAINS
Kappa free light chain: 18.8 mg/L (ref 3.3–19.4)
Kappa, lambda light chain ratio: 1.4 (ref 0.26–1.65)
Lambda free light chains: 13.4 mg/L (ref 5.7–26.3)

## 2018-05-24 LAB — TESTOSTERONE: Testosterone: 77 ng/dL — ABNORMAL LOW (ref 264–916)

## 2018-05-27 ENCOUNTER — Other Ambulatory Visit: Payer: Self-pay

## 2018-05-27 DIAGNOSIS — C9000 Multiple myeloma not having achieved remission: Secondary | ICD-10-CM

## 2018-05-30 ENCOUNTER — Inpatient Hospital Stay: Payer: Medicare Other | Attending: Hematology & Oncology

## 2018-05-30 ENCOUNTER — Other Ambulatory Visit: Payer: Self-pay | Admitting: Family

## 2018-05-30 ENCOUNTER — Inpatient Hospital Stay: Payer: Medicare Other

## 2018-05-30 ENCOUNTER — Other Ambulatory Visit: Payer: Self-pay

## 2018-05-30 DIAGNOSIS — D631 Anemia in chronic kidney disease: Secondary | ICD-10-CM

## 2018-05-30 DIAGNOSIS — C9000 Multiple myeloma not having achieved remission: Secondary | ICD-10-CM | POA: Diagnosis present

## 2018-05-30 DIAGNOSIS — Z5111 Encounter for antineoplastic chemotherapy: Secondary | ICD-10-CM | POA: Insufficient documentation

## 2018-05-30 DIAGNOSIS — G629 Polyneuropathy, unspecified: Secondary | ICD-10-CM | POA: Diagnosis not present

## 2018-05-30 DIAGNOSIS — D509 Iron deficiency anemia, unspecified: Secondary | ICD-10-CM | POA: Insufficient documentation

## 2018-05-30 DIAGNOSIS — M4712 Other spondylosis with myelopathy, cervical region: Secondary | ICD-10-CM

## 2018-05-30 DIAGNOSIS — Z5112 Encounter for antineoplastic immunotherapy: Secondary | ICD-10-CM | POA: Insufficient documentation

## 2018-05-30 DIAGNOSIS — N189 Chronic kidney disease, unspecified: Secondary | ICD-10-CM | POA: Insufficient documentation

## 2018-05-30 DIAGNOSIS — E349 Endocrine disorder, unspecified: Secondary | ICD-10-CM

## 2018-05-30 DIAGNOSIS — Z299 Encounter for prophylactic measures, unspecified: Secondary | ICD-10-CM

## 2018-05-30 LAB — CBC WITH DIFFERENTIAL (CANCER CENTER ONLY)
Abs Immature Granulocytes: 0.05 10*3/uL (ref 0.00–0.07)
Basophils Absolute: 0 10*3/uL (ref 0.0–0.1)
Basophils Relative: 1 %
Eosinophils Absolute: 0.1 10*3/uL (ref 0.0–0.5)
Eosinophils Relative: 2 %
HCT: 32.4 % — ABNORMAL LOW (ref 39.0–52.0)
Hemoglobin: 10.1 g/dL — ABNORMAL LOW (ref 13.0–17.0)
Immature Granulocytes: 1 %
Lymphocytes Relative: 9 %
Lymphs Abs: 0.5 10*3/uL — ABNORMAL LOW (ref 0.7–4.0)
MCH: 25.7 pg — ABNORMAL LOW (ref 26.0–34.0)
MCHC: 31.2 g/dL (ref 30.0–36.0)
MCV: 82.4 fL (ref 80.0–100.0)
Monocytes Absolute: 0.5 10*3/uL (ref 0.1–1.0)
Monocytes Relative: 11 %
Neutro Abs: 3.9 10*3/uL (ref 1.7–7.7)
Neutrophils Relative %: 76 %
Platelet Count: 144 10*3/uL — ABNORMAL LOW (ref 150–400)
RBC: 3.93 MIL/uL — ABNORMAL LOW (ref 4.22–5.81)
RDW: 19.1 % — ABNORMAL HIGH (ref 11.5–15.5)
WBC Count: 5.1 10*3/uL (ref 4.0–10.5)
nRBC: 0.4 % — ABNORMAL HIGH (ref 0.0–0.2)

## 2018-05-30 LAB — CMP (CANCER CENTER ONLY)
ALT: 11 U/L (ref 0–44)
AST: 11 U/L — ABNORMAL LOW (ref 15–41)
Albumin: 4.2 g/dL (ref 3.5–5.0)
Alkaline Phosphatase: 59 U/L (ref 38–126)
Anion gap: 9 (ref 5–15)
BUN: 18 mg/dL (ref 8–23)
CO2: 27 mmol/L (ref 22–32)
Calcium: 8.9 mg/dL (ref 8.9–10.3)
Chloride: 106 mmol/L (ref 98–111)
Creatinine: 2.16 mg/dL — ABNORMAL HIGH (ref 0.61–1.24)
GFR, Est AFR Am: 35 mL/min — ABNORMAL LOW (ref >60)
GFR, Estimated: 30 mL/min — ABNORMAL LOW (ref >60)
Glucose, Bld: 120 mg/dL — ABNORMAL HIGH (ref 70–99)
Potassium: 3.9 mmol/L (ref 3.5–5.1)
Sodium: 142 mmol/L (ref 135–145)
Total Bilirubin: 0.3 mg/dL (ref 0.3–1.2)
Total Protein: 6.7 g/dL (ref 6.5–8.1)

## 2018-05-30 MED ORDER — TESTOSTERONE CYPIONATE 200 MG/ML IM SOLN
INTRAMUSCULAR | Status: AC
  Filled 2018-05-30: qty 2

## 2018-05-30 MED ORDER — HEPARIN SOD (PORK) LOCK FLUSH 100 UNIT/ML IV SOLN
500.0000 [IU] | Freq: Once | INTRAVENOUS | Status: AC | PRN
  Administered 2018-05-30: 500 [IU]
  Filled 2018-05-30: qty 5

## 2018-05-30 MED ORDER — HYDROMORPHONE HCL 1 MG/ML IJ SOLN
INTRAMUSCULAR | Status: AC
  Filled 2018-05-30: qty 2

## 2018-05-30 MED ORDER — DARBEPOETIN ALFA 300 MCG/0.6ML IJ SOSY: 300.0000 ug | PREFILLED_SYRINGE | Freq: Once | INTRAMUSCULAR | Status: DC

## 2018-05-30 MED ORDER — SODIUM CHLORIDE 0.9% FLUSH
10.0000 mL | INTRAVENOUS | Status: DC | PRN
  Administered 2018-05-30: 10 mL
  Filled 2018-05-30: qty 10

## 2018-05-30 MED ORDER — PALONOSETRON HCL INJECTION 0.25 MG/5ML
0.2500 mg | Freq: Once | INTRAVENOUS | Status: AC
  Administered 2018-05-30: 0.25 mg via INTRAVENOUS

## 2018-05-30 MED ORDER — TESTOSTERONE CYPIONATE 200 MG/ML IM SOLN
400.0000 mg | Freq: Once | INTRAMUSCULAR | Status: AC
  Administered 2018-05-30: 13:00:00 400 mg via INTRAMUSCULAR

## 2018-05-30 MED ORDER — DEXTROSE 5 % IV SOLN
28.6000 mg/m2 | Freq: Once | INTRAVENOUS | Status: AC
  Administered 2018-05-30: 11:00:00 60 mg via INTRAVENOUS
  Filled 2018-05-30: qty 30

## 2018-05-30 MED ORDER — HYDROMORPHONE HCL 1 MG/ML IJ SOLN
2.0000 mg | Freq: Once | INTRAMUSCULAR | Status: AC
  Administered 2018-05-30: 09:00:00 2 mg via INTRAVENOUS

## 2018-05-30 MED ORDER — DEXAMETHASONE SODIUM PHOSPHATE 10 MG/ML IJ SOLN
10.0000 mg | Freq: Once | INTRAMUSCULAR | Status: AC
  Administered 2018-05-30: 10 mg via INTRAVENOUS

## 2018-05-30 MED ORDER — SODIUM CHLORIDE 0.9 % IV SOLN
Freq: Once | INTRAVENOUS | Status: AC
  Administered 2018-05-30: 09:00:00 via INTRAVENOUS
  Filled 2018-05-30: qty 250

## 2018-05-30 MED ORDER — PALONOSETRON HCL INJECTION 0.25 MG/5ML
INTRAVENOUS | Status: AC
  Filled 2018-05-30: qty 5

## 2018-05-30 MED ORDER — DEXAMETHASONE SODIUM PHOSPHATE 10 MG/ML IJ SOLN
INTRAMUSCULAR | Status: AC
  Filled 2018-05-30: qty 1

## 2018-05-30 MED ORDER — SODIUM CHLORIDE 0.9 % IV SOLN
300.0000 mg/m2 | Freq: Once | INTRAVENOUS | Status: AC
  Administered 2018-05-30: 12:00:00 620 mg via INTRAVENOUS
  Filled 2018-05-30: qty 31

## 2018-05-30 MED ORDER — SODIUM CHLORIDE 0.9 % IV SOLN
750.0000 mg | Freq: Once | INTRAVENOUS | Status: AC
  Administered 2018-05-30: 10:00:00 750 mg via INTRAVENOUS
  Filled 2018-05-30: qty 15

## 2018-05-30 NOTE — Progress Notes (Signed)
Okay to give Injectafer today per Tara Green, Financial Adv[employer]. 

## 2018-05-30 NOTE — Patient Instructions (Signed)
[facility] Discharge Instructions for Patients Receiving Chemotherapy  Today you received the following chemotherapy agents:  Kyprolis & Cytoxan  To help prevent nausea and vomiting after your treatment, we encourage you to take your nausea medication as prescribed.    If you develop nausea and vomiting that is not controlled by your nausea medication, call the clinic.   BELOW ARE SYMPTOMS THAT SHOULD BE REPORTED IMMEDIATELY:  *FEVER GREATER THAN 100.5 F  *CHILLS WITH OR WITHOUT FEVER  NAUSEA AND VOMITING THAT IS NOT CONTROLLED WITH YOUR NAUSEA MEDICATION  *UNUSUAL SHORTNESS OF BREATH  *UNUSUAL BRUISING OR BLEEDING  TENDERNESS IN MOUTH AND THROAT WITH OR WITHOUT PRESENCE OF ULCERS  *URINARY PROBLEMS  *BOWEL PROBLEMS  UNUSUAL RASH Items with * indicate a potential emergency and should be followed up as soon as possible.  Feel free to call the clinic should you have any questions or concerns. The clinic phone number is [phone].  Please show the CHEMO ALERT CARD at check-in to the Emergency Department and triage nurse.   

## 2018-05-30 NOTE — Progress Notes (Signed)
OK to treat with creat-2.16 per Dr. [name].

## 2018-06-03 ENCOUNTER — Other Ambulatory Visit: Payer: Self-pay | Admitting: *Deleted

## 2018-06-03 DIAGNOSIS — C9000 Multiple myeloma not having achieved remission: Secondary | ICD-10-CM

## 2018-06-03 DIAGNOSIS — D631 Anemia in chronic kidney disease: Secondary | ICD-10-CM

## 2018-06-03 DIAGNOSIS — N189 Chronic kidney disease, unspecified: Secondary | ICD-10-CM

## 2018-06-06 ENCOUNTER — Inpatient Hospital Stay: Payer: Medicare Other

## 2018-06-06 DIAGNOSIS — N189 Chronic kidney disease, unspecified: Secondary | ICD-10-CM

## 2018-06-06 DIAGNOSIS — C9000 Multiple myeloma not having achieved remission: Secondary | ICD-10-CM

## 2018-06-06 DIAGNOSIS — Z5112 Encounter for antineoplastic immunotherapy: Secondary | ICD-10-CM | POA: Diagnosis not present

## 2018-06-06 DIAGNOSIS — E349 Endocrine disorder, unspecified: Secondary | ICD-10-CM

## 2018-06-06 DIAGNOSIS — Z299 Encounter for prophylactic measures, unspecified: Secondary | ICD-10-CM

## 2018-06-06 DIAGNOSIS — M4712 Other spondylosis with myelopathy, cervical region: Secondary | ICD-10-CM

## 2018-06-06 DIAGNOSIS — D631 Anemia in chronic kidney disease: Secondary | ICD-10-CM

## 2018-06-06 LAB — CMP (CANCER CENTER ONLY)
ALT: 10 U/L (ref 0–44)
AST: 12 U/L — ABNORMAL LOW (ref 15–41)
Albumin: 4.2 g/dL (ref 3.5–5.0)
Alkaline Phosphatase: 63 U/L (ref 38–126)
Anion gap: 7 (ref 5–15)
BUN: 26 mg/dL — ABNORMAL HIGH (ref 8–23)
CO2: 27 mmol/L (ref 22–32)
Calcium: 8.6 mg/dL — ABNORMAL LOW (ref 8.9–10.3)
Chloride: 104 mmol/L (ref 98–111)
Creatinine: 2.04 mg/dL — ABNORMAL HIGH (ref 0.61–1.24)
GFR, Est AFR Am: 38 mL/min — ABNORMAL LOW (ref >60)
GFR, Estimated: 33 mL/min — ABNORMAL LOW (ref >60)
Glucose, Bld: 99 mg/dL (ref 70–99)
Potassium: 3.9 mmol/L (ref 3.5–5.1)
Sodium: 138 mmol/L (ref 135–145)
Total Bilirubin: 0.4 mg/dL (ref 0.3–1.2)
Total Protein: 6.5 g/dL (ref 6.5–8.1)

## 2018-06-06 LAB — CBC WITH DIFFERENTIAL (CANCER CENTER ONLY)
Abs Immature Granulocytes: 0.02 10*3/uL (ref 0.00–0.07)
Basophils Absolute: 0 10*3/uL (ref 0.0–0.1)
Basophils Relative: 0 %
Eosinophils Absolute: 0.1 10*3/uL (ref 0.0–0.5)
Eosinophils Relative: 2 %
HCT: 34 % — ABNORMAL LOW (ref 39.0–52.0)
Hemoglobin: 10.7 g/dL — ABNORMAL LOW (ref 13.0–17.0)
Immature Granulocytes: 0 %
Lymphocytes Relative: 10 %
Lymphs Abs: 0.5 10*3/uL — ABNORMAL LOW (ref 0.7–4.0)
MCH: 26.3 pg (ref 26.0–34.0)
MCHC: 31.5 g/dL (ref 30.0–36.0)
MCV: 83.5 fL (ref 80.0–100.0)
Monocytes Absolute: 0.7 10*3/uL (ref 0.1–1.0)
Monocytes Relative: 14 %
Neutro Abs: 3.6 10*3/uL (ref 1.7–7.7)
Neutrophils Relative %: 74 %
Platelet Count: 128 10*3/uL — ABNORMAL LOW (ref 150–400)
RBC: 4.07 MIL/uL — ABNORMAL LOW (ref 4.22–5.81)
RDW: 20 % — ABNORMAL HIGH (ref 11.5–15.5)
WBC Count: 4.9 10*3/uL (ref 4.0–10.5)
nRBC: 0 % (ref 0.0–0.2)

## 2018-06-06 MED ORDER — HYDROMORPHONE HCL 1 MG/ML IJ SOLN
2.0000 mg | Freq: Once | INTRAMUSCULAR | Status: AC
  Administered 2018-06-06: 2 mg via INTRAVENOUS

## 2018-06-06 MED ORDER — SODIUM CHLORIDE 0.9 % IV SOLN
Freq: Once | INTRAVENOUS | Status: AC
  Filled 2018-06-06: qty 250

## 2018-06-06 MED ORDER — SODIUM CHLORIDE 0.9% FLUSH
10.0000 mL | INTRAVENOUS | Status: DC | PRN
  Administered 2018-06-06: 10 mL
  Filled 2018-06-06: qty 10

## 2018-06-06 MED ORDER — SODIUM CHLORIDE 0.9 % IV SOLN
Freq: Once | INTRAVENOUS | Status: AC
  Administered 2018-06-06: 09:00:00 via INTRAVENOUS
  Filled 2018-06-06: qty 250

## 2018-06-06 MED ORDER — DEXTROSE 5 % IV SOLN
29.0000 mg/m2 | Freq: Once | INTRAVENOUS | Status: AC
  Administered 2018-06-06: 60 mg via INTRAVENOUS
  Filled 2018-06-06: qty 30

## 2018-06-06 MED ORDER — HEPARIN SOD (PORK) LOCK FLUSH 100 UNIT/ML IV SOLN
500.0000 [IU] | Freq: Once | INTRAVENOUS | Status: AC | PRN
  Administered 2018-06-06: 500 [IU]
  Filled 2018-06-06: qty 5

## 2018-06-06 MED ORDER — PALONOSETRON HCL INJECTION 0.25 MG/5ML
INTRAVENOUS | Status: AC
  Filled 2018-06-06: qty 5

## 2018-06-06 MED ORDER — SODIUM CHLORIDE 0.9 % IV SOLN
60.0000 mg | Freq: Once | INTRAVENOUS | Status: AC
  Administered 2018-06-06: 60 mg via INTRAVENOUS
  Filled 2018-06-06: qty 20

## 2018-06-06 MED ORDER — DEXAMETHASONE SODIUM PHOSPHATE 10 MG/ML IJ SOLN
INTRAMUSCULAR | Status: AC
  Filled 2018-06-06: qty 1

## 2018-06-06 MED ORDER — SODIUM CHLORIDE 0.9 % IV SOLN
750.0000 mg | Freq: Once | INTRAVENOUS | Status: AC
  Administered 2018-06-06: 750 mg via INTRAVENOUS
  Filled 2018-06-06: qty 15

## 2018-06-06 MED ORDER — DEXAMETHASONE SODIUM PHOSPHATE 10 MG/ML IJ SOLN
10.0000 mg | Freq: Once | INTRAMUSCULAR | Status: AC
  Administered 2018-06-06: 10 mg via INTRAVENOUS

## 2018-06-06 MED ORDER — SODIUM CHLORIDE 0.9 % IV SOLN
300.0000 mg/m2 | Freq: Once | INTRAVENOUS | Status: AC
  Administered 2018-06-06: 620 mg via INTRAVENOUS
  Filled 2018-06-06: qty 31

## 2018-06-06 MED ORDER — HYDROMORPHONE HCL 1 MG/ML IJ SOLN
INTRAMUSCULAR | Status: AC
  Filled 2018-06-06: qty 2

## 2018-06-06 MED ORDER — PALONOSETRON HCL INJECTION 0.25 MG/5ML
0.2500 mg | Freq: Once | INTRAVENOUS | Status: AC
  Administered 2018-06-06: 0.25 mg via INTRAVENOUS

## 2018-06-06 NOTE — Patient Instructions (Signed)
Pamidronate injection What is this medicine? PAMIDRONATE (pa mi DROE nate) slows calcium loss from bones. It is used to treat high calcium blood levels from cancer or Paget's disease. It is also used to treat bone pain and prevent fractures from certain cancers that have spread to the bone. This medicine may be used for other purposes; ask your health care provider or pharmacist if you have questions. COMMON BRAND NAME(S): Aredia What should I tell my health care provider before I take this medicine? They need to know if you have any of these conditions: -aspirin-sensitive asthma -dental disease -kidney disease -an unusual or allergic reaction to pamidronate, other medicines, foods, dyes, or preservatives -pregnant or trying to get pregnant -breast-feeding How should I use this medicine? This medicine is for infusion into a vein. It is given by a health care professional in a hospital or clinic setting. Talk to your pediatrician regarding the use of this medicine in children. This medicine is not approved for use in children. Overdosage: If you think you have taken too much of this medicine contact a poison control center or emergency room at once. NOTE: This medicine is only for you. Do not share this medicine with others. What if I miss a dose? This does not apply. What may interact with this medicine? -certain antibiotics given by injection -medicines for inflammation or pain like ibuprofen, naproxen -some diuretics like bumetanide, furosemide -cyclosporine -parathyroid hormone -tacrolimus -teriparatide -thalidomide This list may not describe all possible interactions. Give your health care provider a list of all the medicines, herbs, non-prescription drugs, or dietary supplements you use. Also tell them if you smoke, drink alcohol, or use illegal drugs. Some items may interact with your medicine. What should I watch for while using this medicine? Visit your doctor or health care  professional for regular checkups. It may be some time before you see the benefit from this medicine. Do not stop taking your medicine unless your doctor tells you to. Your doctor may order blood tests or other tests to see how you are doing. Women should inform their doctor if they wish to become pregnant or think they might be pregnant. There is a potential for serious side effects to an unborn child. Talk to your health care professional or pharmacist for more information. You should make sure that you get enough calcium and vitamin D while you are taking this medicine. Discuss the foods you eat and the vitamins you take with your health care professional. Some people who take this medicine have severe bone, joint, and/or muscle pain. This medicine may also increase your risk for a broken thigh bone. Tell your doctor right away if you have pain in your upper leg or groin. Tell your doctor if you have any pain that does not go away or that gets worse. What side effects may I notice from receiving this medicine? Side effects that you should report to your doctor or health care professional as soon as possible: -allergic reactions like skin rash, itching or hives, swelling of the face, lips, or tongue -black or tarry stools -changes in vision -eye inflammation, pain -high blood pressure -jaw pain, especially burning or cramping -muscle weakness -numb, tingling pain -swelling of feet or hands -trouble passing urine or change in the amount of urine -unable to move easily Side effects that usually do not require medical attention (report to your doctor or health care professional if they continue or are bothersome): -bone, joint, or muscle pain -constipation -dizzy, drowsy -  fever -headache -loss of appetite -nausea, vomiting -pain at site where injected This list may not describe all possible side effects. Call your doctor for medical advice about side effects. You may report side effects to  FDA at 1-800-FDA-1088. Where should I keep my medicine? This drug is given in a hospital or clinic and will not be stored at home. NOTE: This sheet is a summary. It may not cover all possible information. If you have questions about this medicine, talk to your doctor, pharmacist, or health care provider.  2019 Elsevier/Gold Standard (2010-08-08 08:49:49) Cyclophosphamide injection What is this medicine? CYCLOPHOSPHAMIDE (sye kloe FOSS fa mide) is a chemotherapy drug. It slows the growth of cancer cells. This medicine is used to treat many types of cancer like lymphoma, myeloma, leukemia, breast cancer, and ovarian cancer, to name a few. This medicine may be used for other purposes; ask your health care provider or pharmacist if you have questions. COMMON BRAND NAME(S): Cytoxan, Neosar What should I tell my health care provider before I take this medicine? They need to know if you have any of these conditions: -blood disorders -history of other chemotherapy -infection -kidney disease -liver disease -recent or ongoing radiation therapy -tumors in the bone marrow -an unusual or allergic reaction to cyclophosphamide, other chemotherapy, other medicines, foods, dyes, or preservatives -pregnant or trying to get pregnant -breast-feeding How should I use this medicine? This drug is usually given as an injection into a vein or muscle or by infusion into a vein. It is administered in a hospital or clinic by a specially trained health care professional. Talk to your pediatrician regarding the use of this medicine in children. Special care may be needed. Overdosage: If you think you have taken too much of this medicine contact a poison control center or emergency room at once. NOTE: This medicine is only for you. Do not share this medicine with others. What if I miss a dose? It is important not to miss your dose. Call your doctor or health care professional if you are unable to keep an  appointment. What may interact with this medicine? This medicine may interact with the following medications: -amiodarone -amphotericin B -azathioprine -certain antiviral medicines for HIV or AIDS such as protease inhibitors (e.g., indinavir, ritonavir) and zidovudine -certain blood pressure medications such as benazepril, captopril, enalapril, fosinopril, lisinopril, moexipril, monopril, perindopril, quinapril, ramipril, trandolapril -certain cancer medications such as anthracyclines (e.g., daunorubicin, doxorubicin), busulfan, cytarabine, paclitaxel, pentostatin, tamoxifen, trastuzumab -certain diuretics such as chlorothiazide, chlorthalidone, hydrochlorothiazide, indapamide, metolazone -certain medicines that treat or prevent blood clots like warfarin -certain muscle relaxants such as succinylcholine -cyclosporine -etanercept -indomethacin -medicines to increase blood counts like filgrastim, pegfilgrastim, sargramostim -medicines used as general anesthesia -metronidazole -natalizumab This list may not describe all possible interactions. Give your health care provider a list of all the medicines, herbs, non-prescription drugs, or dietary supplements you use. Also tell them if you smoke, drink alcohol, or use illegal drugs. Some items may interact with your medicine. What should I watch for while using this medicine? Visit your doctor for checks on your progress. This drug may make you feel generally unwell. This is not uncommon, as chemotherapy can affect healthy cells as well as cancer cells. Report any side effects. Continue your course of treatment even though you feel ill unless your doctor tells you to stop. Drink water or other fluids as directed. Urinate often, even at night. In some cases, you may be given additional medicines to help with side effects.  Follow all directions for their use. Call your doctor or health care professional for advice if you get a fever, chills or sore  throat, or other symptoms of a cold or flu. Do not treat yourself. This drug decreases your body's ability to fight infections. Try to avoid being around people who are sick. This medicine may increase your risk to bruise or bleed. Call your doctor or health care professional if you notice any unusual bleeding. Be careful brushing and flossing your teeth or using a toothpick because you may get an infection or bleed more easily. If you have any dental work done, tell your dentist you are receiving this medicine. You may get drowsy or dizzy. Do not drive, use machinery, or do anything that needs mental alertness until you know how this medicine affects you. Do not become pregnant while taking this medicine or for 1 year after stopping it. Women should inform their doctor if they wish to become pregnant or think they might be pregnant. Men should not father a child while taking this medicine and for 4 months after stopping it. There is a potential for serious side effects to an unborn child. Talk to your health care professional or pharmacist for more information. Do not breast-feed an infant while taking this medicine. This medicine may interfere with the ability to have a child. This medicine has caused ovarian failure in some women. This medicine has caused reduced sperm counts in some men. You should talk with your doctor or health care professional if you are concerned about your fertility. If you are going to have surgery, tell your doctor or health care professional that you have taken this medicine. What side effects may I notice from receiving this medicine? Side effects that you should report to your doctor or health care professional as soon as possible: -allergic reactions like skin rash, itching or hives, swelling of the face, lips, or tongue -low blood counts - this medicine may decrease the number of white blood cells, red blood cells and platelets. You may be at increased risk for infections  and bleeding. -signs of infection - fever or chills, cough, sore throat, pain or difficulty passing urine -signs of decreased platelets or bleeding - bruising, pinpoint red spots on the skin, black, tarry stools, blood in the urine -signs of decreased red blood cells - unusually weak or tired, fainting spells, lightheadedness -breathing problems -dark urine -dizziness -palpitations -swelling of the ankles, feet, hands -trouble passing urine or change in the amount of urine -weight gain -yellowing of the eyes or skin Side effects that usually do not require medical attention (report to your doctor or health care professional if they continue or are bothersome): -changes in nail or skin color -hair loss -missed menstrual periods -mouth sores -nausea, vomiting This list may not describe all possible side effects. Call your doctor for medical advice about side effects. You may report side effects to FDA at 1-800-FDA-1088. Where should I keep my medicine? This drug is given in a hospital or clinic and will not be stored at home. NOTE: This sheet is a summary. It may not cover all possible information. If you have questions about this medicine, talk to your doctor, pharmacist, or health care provider.  2019 Elsevier/Gold Standard (2011-12-25 16:22:58) Carfilzomib injection What is this medicine? CARFILZOMIB (kar FILZ oh mib) targets a specific protein within cancer cells and stops the cancer cells from growing. It is used to treat multiple myeloma. This medicine may be used for  other purposes; ask your health care provider or pharmacist if you have questions. COMMON BRAND NAME(S): KYPROLIS What should I tell my health care provider before I take this medicine? They need to know if you have any of these conditions: -heart disease -history of blood clots -irregular heartbeat -kidney disease -liver disease -lung or breathing disease -an unusual or allergic reaction to carfilzomib, or  other medicines, foods, dyes, or preservatives -pregnant or trying to get pregnant -breast-feeding How should I use this medicine? This medicine is for injection or infusion into a vein. It is given by a health care professional in a hospital or clinic setting. Talk to your pediatrician regarding the use of this medicine in children. Special care may be needed. Overdosage: If you think you have taken too much of this medicine contact a poison control center or emergency room at once. NOTE: This medicine is only for you. Do not share this medicine with others. What if I miss a dose? It is important not to miss your dose. Call your doctor or health care professional if you are unable to keep an appointment. What may interact with this medicine? Interactions are not expected. Give your health care provider a list of all the medicines, herbs, non-prescription drugs, or dietary supplements you use. Also tell them if you smoke, drink alcohol, or use illegal drugs. Some items may interact with your medicine. This list may not describe all possible interactions. Give your health care provider a list of all the medicines, herbs, non-prescription drugs, or dietary supplements you use. Also tell them if you smoke, drink alcohol, or use illegal drugs. Some items may interact with your medicine. What should I watch for while using this medicine? Your condition will be monitored carefully while you are receiving this medicine. Report any side effects. Continue your course of treatment even though you feel ill unless your doctor tells you to stop. You may need blood work done while you are taking this medicine. Do not become pregnant while taking this medicine or for at least 6 months after stopping it. Women should inform their doctor if they wish to become pregnant or think they might be pregnant. There is a potential for serious side effects to an unborn child. Men should not father a child while taking this  medicine and for at least 3 months after stopping it. Talk to your health care professional or pharmacist for more information. Do not breast-feed an infant while taking this medicine or for 2 weeks after the last dose. Check with your doctor or health care professional if you get an attack of severe diarrhea, nausea and vomiting, or if you sweat a lot. The loss of too much body fluid can make it dangerous for you to take this medicine. You may get dizzy. Do not drive, use machinery, or do anything that needs mental alertness until you know how this medicine affects you. Do not stand or sit up quickly, especially if you are an older patient. This reduces the risk of dizzy or fainting spells. What side effects may I notice from receiving this medicine? Side effects that you should report to your doctor or health care professional as soon as possible: -allergic reactions like skin rash, itching or hives, swelling of the face, lips, or tongue -confusion -dizziness -feeling faint or lightheaded -fever or chills -palpitations -seizures -signs and symptoms of bleeding such as bloody or black, tarry stools; red or dark-brown urine; spitting up blood or brown material that looks like  coffee grounds; red spots on the skin; unusual bruising or bleeding including from the eye, gums, or nose -signs and symptoms of a blood clot such as breathing problems; changes in vision; chest pain; severe, sudden headache; pain, swelling, warmth in the leg; trouble speaking; sudden numbness or weakness of the face, arm or leg -signs and symptoms of kidney injury like trouble passing urine or change in the amount of urine -signs and symptoms of liver injury like dark yellow or brown urine; general ill feeling or flu-like symptoms; light-colored stools; loss of appetite; nausea; right upper belly pain; unusually weak or tired; yellowing of the eyes or skin Side effects that usually do not require medical attention (report to  your doctor or health care professional if they continue or are bothersome): -back pain -cough -diarrhea -headache -muscle cramps -vomiting This list may not describe all possible side effects. Call your doctor for medical advice about side effects. You may report side effects to FDA at 1-800-FDA-1088. Where should I keep my medicine? This drug is given in a hospital or clinic and will not be stored at home. NOTE: This sheet is a summary. It may not cover all possible information. If you have questions about this medicine, talk to your doctor, pharmacist, or health care provider.  2019 Elsevier/Gold Standard (2016-11-25 14:07:13)

## 2018-06-06 NOTE — Patient Instructions (Signed)

## 2018-06-06 NOTE — Progress Notes (Signed)
OK to treat with creatinine of 2.04 per Dr [name] & OK to give Aredia today 3 weeks early & with calcium 8.4 dph

## 2018-06-13 ENCOUNTER — Other Ambulatory Visit: Payer: Self-pay | Admitting: Family

## 2018-06-13 DIAGNOSIS — R109 Unspecified abdominal pain: Secondary | ICD-10-CM

## 2018-06-20 ENCOUNTER — Inpatient Hospital Stay: Payer: Medicare Other

## 2018-06-20 ENCOUNTER — Other Ambulatory Visit: Payer: Self-pay

## 2018-06-20 ENCOUNTER — Inpatient Hospital Stay (HOSPITAL_BASED_OUTPATIENT_CLINIC_OR_DEPARTMENT_OTHER): Payer: Medicare Other | Admitting: Family

## 2018-06-20 DIAGNOSIS — Z299 Encounter for prophylactic measures, unspecified: Secondary | ICD-10-CM

## 2018-06-20 DIAGNOSIS — D508 Other iron deficiency anemias: Secondary | ICD-10-CM

## 2018-06-20 DIAGNOSIS — E349 Endocrine disorder, unspecified: Secondary | ICD-10-CM

## 2018-06-20 DIAGNOSIS — N189 Chronic kidney disease, unspecified: Principal | ICD-10-CM

## 2018-06-20 DIAGNOSIS — D631 Anemia in chronic kidney disease: Secondary | ICD-10-CM

## 2018-06-20 DIAGNOSIS — D509 Iron deficiency anemia, unspecified: Secondary | ICD-10-CM

## 2018-06-20 DIAGNOSIS — Z5112 Encounter for antineoplastic immunotherapy: Secondary | ICD-10-CM | POA: Diagnosis not present

## 2018-06-20 DIAGNOSIS — C9000 Multiple myeloma not having achieved remission: Secondary | ICD-10-CM

## 2018-06-20 DIAGNOSIS — G629 Polyneuropathy, unspecified: Secondary | ICD-10-CM

## 2018-06-20 LAB — CMP (CANCER CENTER ONLY)
ALT: 11 U/L (ref 0–44)
AST: 17 U/L (ref 15–41)
Albumin: 4.4 g/dL (ref 3.5–5.0)
Alkaline Phosphatase: 62 U/L (ref 38–126)
Anion gap: 7 (ref 5–15)
BUN: 16 mg/dL (ref 8–23)
CO2: 27 mmol/L (ref 22–32)
Calcium: 9.3 mg/dL (ref 8.9–10.3)
Chloride: 106 mmol/L (ref 98–111)
Creatinine: 1.88 mg/dL — ABNORMAL HIGH (ref 0.61–1.24)
GFR, Est AFR Am: 42 mL/min — ABNORMAL LOW (ref >60)
GFR, Estimated: 36 mL/min — ABNORMAL LOW (ref >60)
Glucose, Bld: 124 mg/dL — ABNORMAL HIGH (ref 70–99)
Potassium: 4 mmol/L (ref 3.5–5.1)
Sodium: 140 mmol/L (ref 135–145)
Total Bilirubin: 0.4 mg/dL (ref 0.3–1.2)
Total Protein: 7 g/dL (ref 6.5–8.1)

## 2018-06-20 LAB — CBC WITH DIFFERENTIAL (CANCER CENTER ONLY)
Abs Immature Granulocytes: 0.02 10*3/uL (ref 0.00–0.07)
Basophils Absolute: 0 10*3/uL (ref 0.0–0.1)
Basophils Relative: 1 %
Eosinophils Absolute: 0.1 10*3/uL (ref 0.0–0.5)
Eosinophils Relative: 2 %
HCT: 35.2 % — ABNORMAL LOW (ref 39.0–52.0)
Hemoglobin: 11.2 g/dL — ABNORMAL LOW (ref 13.0–17.0)
Immature Granulocytes: 1 %
Lymphocytes Relative: 11 %
Lymphs Abs: 0.4 10*3/uL — ABNORMAL LOW (ref 0.7–4.0)
MCH: 26.7 pg (ref 26.0–34.0)
MCHC: 31.8 g/dL (ref 30.0–36.0)
MCV: 84 fL (ref 80.0–100.0)
Monocytes Absolute: 0.5 10*3/uL (ref 0.1–1.0)
Monocytes Relative: 12 %
Neutro Abs: 2.9 10*3/uL (ref 1.7–7.7)
Neutrophils Relative %: 73 %
Platelet Count: 166 10*3/uL (ref 150–400)
RBC: 4.19 MIL/uL — ABNORMAL LOW (ref 4.22–5.81)
RDW: 19.9 % — ABNORMAL HIGH (ref 11.5–15.5)
WBC Count: 4 10*3/uL (ref 4.0–10.5)
nRBC: 0 % (ref 0.0–0.2)

## 2018-06-20 LAB — LACTATE DEHYDROGENASE: LDH: 232 U/L — ABNORMAL HIGH (ref 98–192)

## 2018-06-20 LAB — FERRITIN: Ferritin: 2765 ng/mL — ABNORMAL HIGH (ref 24–336)

## 2018-06-20 LAB — IRON AND TIBC
Iron: 70 ug/dL (ref 42–163)
Saturation Ratios: 28 % (ref 20–55)
TIBC: 253 ug/dL (ref 202–409)
UIBC: 182 ug/dL (ref 117–376)

## 2018-06-20 MED ORDER — TESTOSTERONE CYPIONATE 200 MG/ML IM SOLN
INTRAMUSCULAR | Status: AC
  Filled 2018-06-20: qty 2

## 2018-06-20 MED ORDER — HYDROMORPHONE HCL 1 MG/ML IJ SOLN
INTRAMUSCULAR | Status: AC
  Filled 2018-06-20: qty 1

## 2018-06-20 MED ORDER — SODIUM CHLORIDE 0.9 % IV SOLN
Freq: Once | INTRAVENOUS | Status: AC
  Administered 2018-06-20: 09:00:00 via INTRAVENOUS
  Filled 2018-06-20: qty 250

## 2018-06-20 MED ORDER — HYDROMORPHONE HCL 1 MG/ML IJ SOLN
2.0000 mg | Freq: Once | INTRAMUSCULAR | Status: AC
  Administered 2018-06-20: 10:00:00 2 mg via INTRAVENOUS

## 2018-06-20 MED ORDER — SODIUM CHLORIDE 0.9 % IV SOLN
300.0000 mg/m2 | Freq: Once | INTRAVENOUS | Status: AC
  Administered 2018-06-20: 12:00:00 620 mg via INTRAVENOUS
  Filled 2018-06-20: qty 31

## 2018-06-20 MED ORDER — PALONOSETRON HCL INJECTION 0.25 MG/5ML
0.2500 mg | Freq: Once | INTRAVENOUS | Status: AC
  Administered 2018-06-20: 10:00:00 0.25 mg via INTRAVENOUS

## 2018-06-20 MED ORDER — SODIUM CHLORIDE 0.9% FLUSH
10.0000 mL | INTRAVENOUS | Status: DC | PRN
  Administered 2018-06-20: 13:00:00 10 mL
  Filled 2018-06-20: qty 10

## 2018-06-20 MED ORDER — DEXAMETHASONE SODIUM PHOSPHATE 10 MG/ML IJ SOLN
10.0000 mg | Freq: Once | INTRAMUSCULAR | Status: AC
  Administered 2018-06-20: 10:00:00 10 mg via INTRAVENOUS

## 2018-06-20 MED ORDER — HYDROMORPHONE HCL 1 MG/ML IJ SOLN
INTRAMUSCULAR | Status: AC
  Filled 2018-06-20: qty 2

## 2018-06-20 MED ORDER — PALONOSETRON HCL INJECTION 0.25 MG/5ML
INTRAVENOUS | Status: AC
  Filled 2018-06-20: qty 5

## 2018-06-20 MED ORDER — GABAPENTIN 300 MG PO CAPS: 300.0000 mg | ORAL_CAPSULE | Freq: Three times a day (TID) | ORAL | 2 refills | Status: DC

## 2018-06-20 MED ORDER — DEXAMETHASONE SODIUM PHOSPHATE 10 MG/ML IJ SOLN
INTRAMUSCULAR | Status: AC
  Filled 2018-06-20: qty 1

## 2018-06-20 MED ORDER — TESTOSTERONE CYPIONATE 200 MG/ML IM SOLN
400.0000 mg | Freq: Once | INTRAMUSCULAR | Status: AC
  Administered 2018-06-20: 13:00:00 400 mg via INTRAMUSCULAR

## 2018-06-20 MED ORDER — HEPARIN SOD (PORK) LOCK FLUSH 100 UNIT/ML IV SOLN
500.0000 [IU] | Freq: Once | INTRAVENOUS | Status: AC | PRN
  Administered 2018-06-20: 500 [IU]
  Filled 2018-06-20: qty 5

## 2018-06-20 MED ORDER — DEXTROSE 5 % IV SOLN
29.0000 mg/m2 | Freq: Once | INTRAVENOUS | Status: AC
  Administered 2018-06-20: 11:00:00 60 mg via INTRAVENOUS
  Filled 2018-06-20: qty 30

## 2018-06-20 NOTE — Progress Notes (Signed)
Okay to treat with SCr 1.88 per Dr. Ennever. °

## 2018-06-20 NOTE — Patient Instructions (Signed)

## 2018-06-20 NOTE — Patient Instructions (Signed)
Testosterone injection What is this medicine? TESTOSTERONE (tes TOS ter one) is the main male hormone. It supports normal male development such as muscle growth, facial hair, and deep voice. It is used in males to treat low testosterone levels. This medicine may be used for other purposes; ask your health care provider or pharmacist if you have questions. COMMON BRAND NAME(S): Andro-L.A., Aveed, Delatestryl, Depo-Testosterone, Virilon What should I tell my health care provider before I take this medicine? They need to know if you have any of these conditions: -cancer -diabetes -heart disease -kidney disease -liver disease -lung disease -prostate disease -an unusual or allergic reaction to testosterone, other medicines, foods, dyes, or preservatives -pregnant or trying to get pregnant -breast-feeding How should I use this medicine? This medicine is for injection into a muscle. It is usually given by a health care professional in a hospital or clinic setting. Contact your pediatrician regarding the use of this medicine in children. While this medicine may be prescribed for children as young as 69 years of age for selected conditions, precautions do apply. Overdosage: If you think you have taken too much of this medicine contact a poison control center or emergency room at once. NOTE: This medicine is only for you. Do not share this medicine with others. What if I miss a dose? Try not to miss a dose. Your doctor or health care professional will tell you when your next injection is due. Notify the office if you are unable to keep an appointment. What may interact with this medicine? -medicines for diabetes -medicines that treat or prevent blood clots like warfarin -oxyphenbutazone -propranolol -steroid medicines like prednisone or cortisone This list may not describe all possible interactions. Give your health care provider a list of all the medicines, herbs, non-prescription drugs, or  dietary supplements you use. Also tell them if you smoke, drink alcohol, or use illegal drugs. Some items may interact with your medicine. What should I watch for while using this medicine? Visit your doctor or health care professional for regular checks on your progress. They will need to check the level of testosterone in your blood. This medicine is only approved for use in men who have low levels of testosterone related to certain medical conditions. Heart attacks and strokes have been reported with the use of this medicine. Notify your doctor or health care professional and seek emergency treatment if you develop breathing problems; changes in vision; confusion; chest pain or chest tightness; sudden arm pain; severe, sudden headache; trouble speaking or understanding; sudden numbness or weakness of the face, arm or leg; loss of balance or coordination. Talk to your doctor about the risks and benefits of this medicine. This medicine may affect blood sugar levels. If you have diabetes, check with your doctor or health care professional before you change your diet or the dose of your diabetic medicine. Testosterone injections are not commonly used in women. Women should inform their doctor if they wish to become pregnant or think they might be pregnant. There is a potential for serious side effects to an unborn child. Talk to your health care professional or pharmacist for more information. Talk with your doctor or health care professional about your birth control options while taking this medicine. This drug is banned from use in athletes by most athletic organizations. What side effects may I notice from receiving this medicine? Side effects that you should report to your doctor or health care professional as soon as possible: -allergic reactions like skin rash,  itching or hives, swelling of the face, lips, or tongue -breast enlargement -breathing problems -changes in emotions or moods -deep or  hoarse voice -irregular menstrual periods -signs and symptoms of liver injury like dark yellow or brown urine; general ill feeling or flu-like symptoms; light-colored stools; loss of appetite; nausea; right upper belly pain; unusually weak or tired; yellowing of the eyes or skin -stomach pain -swelling of the ankles, feet, hands -too frequent or persistent erections -trouble passing urine or change in the amount of urine Side effects that usually do not require medical attention (report to your doctor or health care professional if they continue or are bothersome): -acne -change in sex drive or performance -facial hair growth -hair loss -headache This list may not describe all possible side effects. Call your doctor for medical advice about side effects. You may report side effects to FDA at 1-800-FDA-1088. Where should I keep my medicine? Keep out of the reach of children. This medicine can be abused. Keep your medicine in a safe place to protect it from theft. Do not share this medicine with anyone. Selling or giving away this medicine is dangerous and against the law. Store at room temperature between 20 and 25 degrees C (68 and 77 degrees F). Do not freeze. Protect from light. Follow the directions for the product you are prescribed. Throw away any unused medicine after the expiration date. NOTE: This sheet is a summary. It may not cover all possible information. If you have questions about this medicine, talk to your doctor, pharmacist, or health care provider.  2019 Elsevier/Gold Standard (2015-03-16 07:33:55) Carfilzomib injection What is this medicine? CARFILZOMIB (kar FILZ oh mib) targets a specific protein within cancer cells and stops the cancer cells from growing. It is used to treat multiple myeloma. This medicine may be used for other purposes; ask your health care provider or pharmacist if you have questions. COMMON BRAND NAME(S): KYPROLIS What should I tell my health care  provider before I take this medicine? They need to know if you have any of these conditions: -heart disease -history of blood clots -irregular heartbeat -kidney disease -liver disease -lung or breathing disease -an unusual or allergic reaction to carfilzomib, or other medicines, foods, dyes, or preservatives -pregnant or trying to get pregnant -breast-feeding How should I use this medicine? This medicine is for injection or infusion into a vein. It is given by a health care professional in a hospital or clinic setting. Talk to your pediatrician regarding the use of this medicine in children. Special care may be needed. Overdosage: If you think you have taken too much of this medicine contact a poison control center or emergency room at once. NOTE: This medicine is only for you. Do not share this medicine with others. What if I miss a dose? It is important not to miss your dose. Call your doctor or health care professional if you are unable to keep an appointment. What may interact with this medicine? Interactions are not expected. Give your health care provider a list of all the medicines, herbs, non-prescription drugs, or dietary supplements you use. Also tell them if you smoke, drink alcohol, or use illegal drugs. Some items may interact with your medicine. This list may not describe all possible interactions. Give your health care provider a list of all the medicines, herbs, non-prescription drugs, or dietary supplements you use. Also tell them if you smoke, drink alcohol, or use illegal drugs. Some items may interact with your medicine. What should I  watch for while using this medicine? Your condition will be monitored carefully while you are receiving this medicine. Report any side effects. Continue your course of treatment even though you feel ill unless your doctor tells you to stop. You may need blood work done while you are taking this medicine. Do not become pregnant while taking  this medicine or for at least 6 months after stopping it. Women should inform their doctor if they wish to become pregnant or think they might be pregnant. There is a potential for serious side effects to an unborn child. Men should not father a child while taking this medicine and for at least 3 months after stopping it. Talk to your health care professional or pharmacist for more information. Do not breast-feed an infant while taking this medicine or for 2 weeks after the last dose. Check with your doctor or health care professional if you get an attack of severe diarrhea, nausea and vomiting, or if you sweat a lot. The loss of too much body fluid can make it dangerous for you to take this medicine. You may get dizzy. Do not drive, use machinery, or do anything that needs mental alertness until you know how this medicine affects you. Do not stand or sit up quickly, especially if you are an older patient. This reduces the risk of dizzy or fainting spells. What side effects may I notice from receiving this medicine? Side effects that you should report to your doctor or health care professional as soon as possible: -allergic reactions like skin rash, itching or hives, swelling of the face, lips, or tongue -confusion -dizziness -feeling faint or lightheaded -fever or chills -palpitations -seizures -signs and symptoms of bleeding such as bloody or black, tarry stools; red or dark-brown urine; spitting up blood or brown material that looks like coffee grounds; red spots on the skin; unusual bruising or bleeding including from the eye, gums, or nose -signs and symptoms of a blood clot such as breathing problems; changes in vision; chest pain; severe, sudden headache; pain, swelling, warmth in the leg; trouble speaking; sudden numbness or weakness of the face, arm or leg -signs and symptoms of kidney injury like trouble passing urine or change in the amount of urine -signs and symptoms of liver injury like  dark yellow or brown urine; general ill feeling or flu-like symptoms; light-colored stools; loss of appetite; nausea; right upper belly pain; unusually weak or tired; yellowing of the eyes or skin Side effects that usually do not require medical attention (report to your doctor or health care professional if they continue or are bothersome): -back pain -cough -diarrhea -headache -muscle cramps -vomiting This list may not describe all possible side effects. Call your doctor for medical advice about side effects. You may report side effects to FDA at 1-800-FDA-1088. Where should I keep my medicine? This drug is given in a hospital or clinic and will not be stored at home. NOTE: This sheet is a summary. It may not cover all possible information. If you have questions about this medicine, talk to your doctor, pharmacist, or health care provider.  2019 [employer]/Gold Standard (2016-11-25 14:07:13) Cyclophosphamide injection What is this medicine? CYCLOPHOSPHAMIDE (sye kloe FOSS fa mide) is a chemotherapy drug. It slows the growth of cancer cells. This medicine is used to treat many types of cancer like lymphoma, myeloma, leukemia, breast cancer, and ovarian cancer, to name a few. This medicine may be used for other purposes; ask your health care provider or pharmacist if  you have questions. COMMON BRAND NAME(S): Cytoxan, Neosar What should I tell my health care provider before I take this medicine? They need to know if you have any of these conditions: -blood disorders -history of other chemotherapy -infection -kidney disease -liver disease -recent or ongoing radiation therapy -tumors in the bone marrow -an unusual or allergic reaction to cyclophosphamide, other chemotherapy, other medicines, foods, dyes, or preservatives -pregnant or trying to get pregnant -breast-feeding How should I use this medicine? This drug is usually given as an injection into a vein or muscle or by infusion into a  vein. It is administered in a hospital or clinic by a specially trained health care professional. Talk to your pediatrician regarding the use of this medicine in children. Special care may be needed. Overdosage: If you think you have taken too much of this medicine contact a poison control center or emergency room at once. NOTE: This medicine is only for you. Do not share this medicine with others. What if I miss a dose? It is important not to miss your dose. Call your doctor or health care professional if you are unable to keep an appointment. What may interact with this medicine? This medicine may interact with the following medications: -amiodarone -amphotericin B -azathioprine -certain antiviral medicines for HIV or AIDS such as protease inhibitors (e.g., indinavir, ritonavir) and zidovudine -certain blood pressure medications such as benazepril, captopril, enalapril, fosinopril, lisinopril, moexipril, monopril, perindopril, quinapril, ramipril, trandolapril -certain cancer medications such as anthracyclines (e.g., daunorubicin, doxorubicin), busulfan, cytarabine, paclitaxel, pentostatin, tamoxifen, trastuzumab -certain diuretics such as chlorothiazide, chlorthalidone, hydrochlorothiazide, indapamide, metolazone -certain medicines that treat or prevent blood clots like warfarin -certain muscle relaxants such as succinylcholine -cyclosporine -etanercept -indomethacin -medicines to increase blood counts like filgrastim, pegfilgrastim, sargramostim -medicines used as general anesthesia -metronidazole -natalizumab This list may not describe all possible interactions. Give your health care provider a list of all the medicines, herbs, non-prescription drugs, or dietary supplements you use. Also tell them if you smoke, drink alcohol, or use illegal drugs. Some items may interact with your medicine. What should I watch for while using this medicine? Visit your doctor for checks on your  progress. This drug may make you feel generally unwell. This is not uncommon, as chemotherapy can affect healthy cells as well as cancer cells. Report any side effects. Continue your course of treatment even though you feel ill unless your doctor tells you to stop. Drink water or other fluids as directed. Urinate often, even at night. In some cases, you may be given additional medicines to help with side effects. Follow all directions for their use. Call your doctor or health care professional for advice if you get a fever, chills or sore throat, or other symptoms of a cold or flu. Do not treat yourself. This drug decreases your body's ability to fight infections. Try to avoid being around people who are sick. This medicine may increase your risk to bruise or bleed. Call your doctor or health care professional if you notice any unusual bleeding. Be careful brushing and flossing your teeth or using a toothpick because you may get an infection or bleed more easily. If you have any dental work done, tell your dentist you are receiving this medicine. You may get drowsy or dizzy. Do not drive, use machinery, or do anything that needs mental alertness until you know how this medicine affects you. Do not become pregnant while taking this medicine or for 1 year after stopping it. Women should inform their  doctor if they wish to become pregnant or think they might be pregnant. Men should not father a child while taking this medicine and for 4 months after stopping it. There is a potential for serious side effects to an unborn child. Talk to your health care professional or pharmacist for more information. Do not breast-feed an infant while taking this medicine. This medicine may interfere with the ability to have a child. This medicine has caused ovarian failure in some women. This medicine has caused reduced sperm counts in some men. You should talk with your doctor or health care professional if you are concerned  about your fertility. If you are going to have surgery, tell your doctor or health care professional that you have taken this medicine. What side effects may I notice from receiving this medicine? Side effects that you should report to your doctor or health care professional as soon as possible: -allergic reactions like skin rash, itching or hives, swelling of the face, lips, or tongue -low blood counts - this medicine may decrease the number of white blood cells, red blood cells and platelets. You may be at increased risk for infections and bleeding. -signs of infection - fever or chills, cough, sore throat, pain or difficulty passing urine -signs of decreased platelets or bleeding - bruising, pinpoint red spots on the skin, black, tarry stools, blood in the urine -signs of decreased red blood cells - unusually weak or tired, fainting spells, lightheadedness -breathing problems -dark urine -dizziness -palpitations -swelling of the ankles, feet, hands -trouble passing urine or change in the amount of urine -weight gain -yellowing of the eyes or skin Side effects that usually do not require medical attention (report to your doctor or health care professional if they continue or are bothersome): -changes in nail or skin color -hair loss -missed menstrual periods -mouth sores -nausea, vomiting This list may not describe all possible side effects. Call your doctor for medical advice about side effects. You may report side effects to FDA at 1-800-FDA-1088. Where should I keep my medicine? This drug is given in a hospital or clinic and will not be stored at home. NOTE: This sheet is a summary. It may not cover all possible information. If you have questions about this medicine, talk to your doctor, pharmacist, or health care provider.  2019 [employer]/Gold Standard (2011-12-25 16:22:58)

## 2018-06-20 NOTE — Progress Notes (Signed)
Hematology and Oncology Follow Up Visit  [name] [id number] Jul 14, 1950 68 y.o. 06/20/2018   Principle Diagnosis:  IgG kappa myeloma Anemia secondary to renal insufficiency Intermittent iron - deficiency anemia Hypotestosteronemia  Current Therapy:   Aredia 60 mg IV q 3 months-- next dose 06/2018 Aranesp 300 mcg subcu as needed for hemoglobin less than 11 DepoTestosterone400 mg q 4 weeks IV iron as indicated Kyprolis/Cytoxan - s/p cycle18   Interim History:  [name] is here today for follow-up and treatment. He states that he has been having pain in the neck and also some mild puffiness in the right supraclavicular space for the last 2-3 months. No lymph nodes palpated on exam but patient states the area is a little tender.  His right sided port a cath had already been accessed without any issues.  He states that he has noted chills and night sweats over the last 2 months or so.  In March no M-spike was present, IgG level was 720 and kappa light chains were 1.88 mg/dL.  No fever, n/v, cough, rash, dizziness, SOB, chest pain, palpitations, abdominal pain or changes in bowel or bladder habits.  No swelling in his extremities at this time.  He has generalized aches and pains due to arthritis.  He states that the lasix has significantly helped reduce his fluid retention. He has neuropathy in his feet that is bothersome and he would like something to help with the burning and tingling.  He has maintained a good appetite and is staying well hydrated.   ECOG Performance Status: 1 - Symptomatic but completely ambulatory  Medications:  Allergies as of 06/20/2018      Reactions   Iodinated Diagnostic Agents Rash   Patient states he was instructed not to take IV contrast.  In 2008 he had an unknown reaction, and was told not to take it again.  He was also told not to take it due to his kidneys.   Iodine Anxiety, Rash, Other (See Comments)   Didn't feel right "instructed not  to take per MD--something with his port"      Medication List       Accurate as of June 20, 2018  9:06 AM. Always use your most recent med list.        amLODipine 10 MG tablet Commonly known as:  NORVASC Take 10 mg by mouth daily.   Carafate 1 GM/10ML suspension Generic drug:  sucralfate Take 1 g by mouth 4 (four) times daily.   Clear Eyes All Seasons 5-6 MG/ML Soln Generic drug:  Polyvinyl Alcohol-Povidone Place 2 drops into both eyes 3 (three) times daily as needed (drye eyes.).   cyclobenzaprine 10 MG tablet Commonly known as:  FLEXERIL Take 10 mg by mouth 3 (three) times daily.   dicyclomine 10 MG capsule Commonly known as:  BENTYL TAKE 1 CAPSULE BY MOUTH THREE TIMES A DAY   dronabinol 2.5 MG capsule Commonly known as:  MARINOL Take 1 capsule (2.5 mg total) by mouth 2 (two) times daily before a meal.   Embeda 30-1.2 MG Cpcr Generic drug:  Morphine-Naltrexone Take 3 (three) times daily by mouth.   Embeda 20-0.8 MG Cpcr Generic drug:  Morphine-Naltrexone Take 1 capsule by mouth 3 (three) times daily.   furosemide 40 MG tablet Commonly known as:  LASIX Take 40 mg by mouth.   glipiZIDE 5 MG tablet Commonly known as:  GLUCOTROL Take 5 mg by mouth 2 (two) times daily before a meal.   HYDROcodone-acetaminophen 5-325 MG  tablet Commonly known as:  Norco Take 1-2 tablets by mouth every 6 (six) hours as needed.   lactulose 10 GM/15ML solution Commonly known as:  CHRONULAC TAKE 30 MILLILITERS BY MOUTH ONCE DAILY   latanoprost 0.005 % ophthalmic solution Commonly known as:  XALATAN Place 1 drop into the right eye every evening.   lidocaine-prilocaine cream Commonly known as:  EMLA Apply 1 application topically as needed.   linaclotide 145 MCG Caps capsule Commonly known as:  LINZESS Take 1 capsule (145 mcg total) by mouth daily before breakfast.   LORazepam 0.5 MG tablet Commonly known as:  Ativan Take 1 tablet (0.5 mg total) by mouth every 6 (six)  hours as needed (Nausea or vomiting).   losartan 25 MG tablet Commonly known as:  COZAAR Take 25 mg by mouth.   metoprolol succinate 100 MG 24 hr tablet Commonly known as:  TOPROL-XL Take 100 mg by mouth daily.   mirabegron ER 50 MG Tb24 tablet Commonly known as:  MYRBETRIQ Take 50 mg by mouth daily.   omeprazole 40 MG capsule Commonly known as:  PRILOSEC TAKE 1 CAPSULE BY MOUTH TWICE A DAY   ondansetron 8 MG tablet Commonly known as:  ZOFRAN TAKE 1 TABLET(8 MG) BY MOUTH TWICE DAILY AS NEEDED FOR NAUSEA OR VOMITING   orphenadrine 100 MG tablet Commonly known as:  NORFLEX Take 1 tablet (100 mg total) by mouth 2 (two) times daily.   oxyCODONE-acetaminophen 10-325 MG tablet Commonly known as:  PERCOCET Take 1 tablet by mouth every 6 (six) hours as needed for pain.   pioglitazone 30 MG tablet Commonly known as:  ACTOS Take 30 mg by mouth daily.   potassium chloride SA 20 MEQ tablet Commonly known as:  K-DUR Take 1 tablet (20 mEq total) by mouth 2 (two) times daily.   prochlorperazine 10 MG tablet Commonly known as:  COMPAZINE take 1 tablet by mouth every 6 hours if needed for nausea and vomiting   Relistor 150 MG Tabs Generic drug:  Methylnaltrexone Bromide Take 150 mg by mouth.   Sennosides 25 MG Tabs Take 25 mg by mouth daily as needed (constipation).   Vitamin D (Ergocalciferol) 1.25 MG (50000 UT) Caps capsule Commonly known as:  DRISDOL Take 50,000 Units by mouth every Saturday.   Zioptan 0.0015 % Soln Generic drug:  Tafluprost (PF) Place 1 drop into both eyes daily.       Allergies:  Allergies  Allergen Reactions  . Iodinated Diagnostic Agents Rash    Patient states he was instructed not to take IV contrast.  In 2008 he had an unknown reaction, and was told not to take it again.  He was also told not to take it due to his kidneys.  . Iodine Anxiety, Rash and Other (See Comments)    Didn't feel right "instructed not to take per MD--something with his  port"     Past Medical History, Surgical history, Social history, and Family History were reviewed and updated.  Review of Systems: All other 10 point review of systems is negative.   Physical Exam:  vitals were not taken for this visit.   Wt Readings from Last 3 Encounters:  06/06/18 195 lb 1.3 oz (88.5 kg)  05/23/18 196 lb (88.9 kg)  04/25/18 192 lb 12 oz (87.4 kg)    Ocular: Sclerae unicteric, pupils equal, round and reactive to light Ear-nose-throat: Oropharynx clear, dentition fair Lymphatic: No cervical or supraclavicular adenopathy Lungs no rales or rhonchi, good excursion bilaterally Heart regular  rate and rhythm, no murmur appreciated Abd soft, nontender, positive bowel sounds, no liver or spleen tip palpated on exam, no fluid wave  MSK no focal spinal tenderness, no joint edema Neuro: non-focal, well-oriented, appropriate affect Breasts: Deferred   Lab Results  Component Value Date   WBC 4.0 06/20/2018   HGB 11.2 (L) 06/20/2018   HCT 35.2 (L) 06/20/2018   MCV 84.0 06/20/2018   PLT 166 06/20/2018   Lab Results  Component Value Date   FERRITIN 888 (H) 05/23/2018   IRON 15 (L) 05/23/2018   TIBC 263 05/23/2018   UIBC 247 05/23/2018   IRONPCTSAT 6 (L) 05/23/2018   Lab Results  Component Value Date   RETICCTPCT 0.7 03/28/2018   RBC 4.19 (L) 06/20/2018   RETICCTABS 27.5 03/21/2014   Lab Results  Component Value Date   KPAFRELGTCHN 18.8 05/23/2018   LAMBDASER 13.4 05/23/2018   KAPLAMBRATIO 1.40 05/23/2018   Lab Results  Component Value Date   IGGSERUM 720 05/23/2018   IGA 125 05/23/2018   IGMSERUM 17 (L) 05/23/2018   Lab Results  Component Value Date   TOTALPROTELP 5.8 (L) 05/23/2018   ALBUMINELP 3.4 05/23/2018   A1GS 0.2 05/23/2018   A2GS 0.9 05/23/2018   BETS 0.7 05/23/2018   BETA2SER 0.5 11/07/2014   GAMS 0.5 05/23/2018   MSPIKE Not Observed 05/23/2018   SPEI Comment 03/28/2018     Chemistry      Component Value Date/Time   NA 138  06/06/2018 0815   NA 143 02/22/2017 0744   K 3.9 06/06/2018 0815   K 4.0 02/22/2017 0744   CL 104 06/06/2018 0815   CL 106 02/22/2017 0744   CO2 27 06/06/2018 0815   CO2 27 02/22/2017 0744   BUN 26 (H) 06/06/2018 0815   BUN 26 (H) 02/22/2017 0744   CREATININE 2.04 (H) 06/06/2018 0815   CREATININE 2.3 (H) 02/22/2017 0744      Component Value Date/Time   CALCIUM 8.6 (L) 06/06/2018 0815   CALCIUM 9.3 02/22/2017 0744   ALKPHOS 63 06/06/2018 0815   ALKPHOS 80 02/22/2017 0744   AST 12 (L) 06/06/2018 0815   ALT 10 06/06/2018 0815   ALT 22 02/22/2017 0744   BILITOT 0.4 06/06/2018 0815       Impression and Plan: [name] is a very pleasant 68 yo African American gentleman with IgG kappa myeloma. His myeloma studies have remained stable.  Today's levels were drawn and results are pending.  He is tolerating treatment nicely and we will proceed with cycle 19 as planned.  He goes this week to see a surgeon about his neck and to discuss treatment.  We will have him tray Neurontin TID for his neuropathy and he may also get some relief from his night sweats as well.  We will plan to see him back in another month for follow-up.  She will contact our office with any questions or concerns. We can certainly see her sooner if need be.   Laverna Peace, NP 4/27/20209:06 AM

## 2018-06-21 LAB — PROTEIN ELECTROPHORESIS, SERUM, WITH REFLEX
A/G Ratio: 1.3 (ref 0.7–1.7)
Albumin ELP: 3.7 g/dL (ref 2.9–4.4)
Alpha-1-Globulin: 0.3 g/dL (ref 0.0–0.4)
Alpha-2-Globulin: 1 g/dL (ref 0.4–1.0)
Beta Globulin: 0.9 g/dL (ref 0.7–1.3)
Gamma Globulin: 0.7 g/dL (ref 0.4–1.8)
Globulin, Total: 2.9 g/dL (ref 2.2–3.9)
Total Protein ELP: 6.6 g/dL (ref 6.0–8.5)

## 2018-06-21 LAB — IGG, IGA, IGM
IgA: 143 mg/dL (ref 61–437)
IgG (Immunoglobin G), Serum: 754 mg/dL (ref 603–1613)
IgM (Immunoglobulin M), Srm: 19 mg/dL — ABNORMAL LOW (ref 20–172)

## 2018-06-21 LAB — KAPPA/LAMBDA LIGHT CHAINS
Kappa free light chain: 21.9 mg/L — ABNORMAL HIGH (ref 3.3–19.4)
Kappa, lambda light chain ratio: 1.65 (ref 0.26–1.65)
Lambda free light chains: 13.3 mg/L (ref 5.7–26.3)

## 2018-06-21 LAB — TESTOSTERONE: Testosterone: 418 ng/dL (ref 264–916)

## 2018-06-24 ENCOUNTER — Other Ambulatory Visit: Payer: Self-pay | Admitting: General Surgery

## 2018-06-24 ENCOUNTER — Other Ambulatory Visit: Payer: Self-pay | Admitting: *Deleted

## 2018-06-24 DIAGNOSIS — C9002 Multiple myeloma in relapse: Secondary | ICD-10-CM

## 2018-06-27 ENCOUNTER — Inpatient Hospital Stay: Payer: Medicare Other

## 2018-06-27 ENCOUNTER — Inpatient Hospital Stay: Payer: Medicare Other | Attending: Hematology & Oncology

## 2018-06-27 ENCOUNTER — Ambulatory Visit (HOSPITAL_BASED_OUTPATIENT_CLINIC_OR_DEPARTMENT_OTHER)
Admission: RE | Admit: 2018-06-27 | Discharge: 2018-06-27 | Disposition: A | Discharge status: Home / Self Care | Payer: Medicare Other | Source: Ambulatory Visit | Attending: Hematology & Oncology | Admitting: Hematology & Oncology

## 2018-06-27 ENCOUNTER — Telehealth: Payer: Self-pay | Admitting: *Deleted

## 2018-06-27 ENCOUNTER — Other Ambulatory Visit: Payer: Self-pay

## 2018-06-27 VITALS — BP 157/73 | HR 93 | Temp 98.7°F | Resp 19

## 2018-06-27 DIAGNOSIS — R140 Abdominal distension (gaseous): Secondary | ICD-10-CM | POA: Insufficient documentation

## 2018-06-27 DIAGNOSIS — M797 Fibromyalgia: Secondary | ICD-10-CM | POA: Insufficient documentation

## 2018-06-27 DIAGNOSIS — E119 Type 2 diabetes mellitus without complications: Secondary | ICD-10-CM | POA: Diagnosis not present

## 2018-06-27 DIAGNOSIS — C9000 Multiple myeloma not having achieved remission: Secondary | ICD-10-CM | POA: Insufficient documentation

## 2018-06-27 DIAGNOSIS — D509 Iron deficiency anemia, unspecified: Secondary | ICD-10-CM | POA: Diagnosis not present

## 2018-06-27 DIAGNOSIS — Z5111 Encounter for antineoplastic chemotherapy: Secondary | ICD-10-CM | POA: Diagnosis present

## 2018-06-27 DIAGNOSIS — C9002 Multiple myeloma in relapse: Secondary | ICD-10-CM | POA: Insufficient documentation

## 2018-06-27 DIAGNOSIS — Z7984 Long term (current) use of oral hypoglycemic drugs: Secondary | ICD-10-CM | POA: Insufficient documentation

## 2018-06-27 DIAGNOSIS — Z79899 Other long term (current) drug therapy: Secondary | ICD-10-CM | POA: Insufficient documentation

## 2018-06-27 DIAGNOSIS — Z5112 Encounter for antineoplastic immunotherapy: Secondary | ICD-10-CM | POA: Insufficient documentation

## 2018-06-27 DIAGNOSIS — D631 Anemia in chronic kidney disease: Secondary | ICD-10-CM

## 2018-06-27 DIAGNOSIS — Z299 Encounter for prophylactic measures, unspecified: Secondary | ICD-10-CM

## 2018-06-27 DIAGNOSIS — E349 Endocrine disorder, unspecified: Secondary | ICD-10-CM

## 2018-06-27 DIAGNOSIS — N189 Chronic kidney disease, unspecified: Principal | ICD-10-CM

## 2018-06-27 LAB — CMP (CANCER CENTER ONLY)
ALT: 12 U/L (ref 0–44)
AST: 27 U/L (ref 15–41)
Albumin: 4.1 g/dL (ref 3.5–5.0)
Alkaline Phosphatase: 47 U/L (ref 38–126)
Anion gap: 8 (ref 5–15)
BUN: 20 mg/dL (ref 8–23)
CO2: 25 mmol/L (ref 22–32)
Calcium: 8.7 mg/dL — ABNORMAL LOW (ref 8.9–10.3)
Chloride: 109 mmol/L (ref 98–111)
Creatinine: 2.32 mg/dL — ABNORMAL HIGH (ref 0.61–1.24)
GFR, Est AFR Am: 32 mL/min — ABNORMAL LOW (ref >60)
GFR, Estimated: 28 mL/min — ABNORMAL LOW (ref >60)
Glucose, Bld: 53 mg/dL — ABNORMAL LOW (ref 70–99)
Potassium: 3.7 mmol/L (ref 3.5–5.1)
Sodium: 142 mmol/L (ref 135–145)
Total Bilirubin: 0.4 mg/dL (ref 0.3–1.2)
Total Protein: 6.6 g/dL (ref 6.5–8.1)

## 2018-06-27 LAB — CBC WITH DIFFERENTIAL (CANCER CENTER ONLY)
Abs Immature Granulocytes: 0.02 10*3/uL (ref 0.00–0.07)
Basophils Absolute: 0 10*3/uL (ref 0.0–0.1)
Basophils Relative: 1 %
Eosinophils Absolute: 0.1 10*3/uL (ref 0.0–0.5)
Eosinophils Relative: 2 %
HCT: 32.3 % — ABNORMAL LOW (ref 39.0–52.0)
Hemoglobin: 10.1 g/dL — ABNORMAL LOW (ref 13.0–17.0)
Immature Granulocytes: 0 %
Lymphocytes Relative: 11 %
Lymphs Abs: 0.6 10*3/uL — ABNORMAL LOW (ref 0.7–4.0)
MCH: 26.3 pg (ref 26.0–34.0)
MCHC: 31.3 g/dL (ref 30.0–36.0)
MCV: 84.1 fL (ref 80.0–100.0)
Monocytes Absolute: 0.9 10*3/uL (ref 0.1–1.0)
Monocytes Relative: 16 %
Neutro Abs: 3.9 10*3/uL (ref 1.7–7.7)
Neutrophils Relative %: 70 %
Platelet Count: 138 10*3/uL — ABNORMAL LOW (ref 150–400)
RBC: 3.84 MIL/uL — ABNORMAL LOW (ref 4.22–5.81)
RDW: 20.1 % — ABNORMAL HIGH (ref 11.5–15.5)
WBC Count: 5.6 10*3/uL (ref 4.0–10.5)
nRBC: 0 % (ref 0.0–0.2)

## 2018-06-27 MED ORDER — PALONOSETRON HCL INJECTION 0.25 MG/5ML
0.2500 mg | Freq: Once | INTRAVENOUS | Status: AC
  Administered 2018-06-27: 09:00:00 0.25 mg via INTRAVENOUS

## 2018-06-27 MED ORDER — PALONOSETRON HCL INJECTION 0.25 MG/5ML
INTRAVENOUS | Status: AC
  Filled 2018-06-27: qty 5

## 2018-06-27 MED ORDER — DEXAMETHASONE SODIUM PHOSPHATE 10 MG/ML IJ SOLN
10.0000 mg | Freq: Once | INTRAMUSCULAR | Status: AC
  Administered 2018-06-27: 09:00:00 10 mg via INTRAVENOUS

## 2018-06-27 MED ORDER — HYDROMORPHONE HCL 1 MG/ML IJ SOLN
2.0000 mg | Freq: Once | INTRAMUSCULAR | Status: AC
  Administered 2018-06-27: 09:00:00 2 mg via INTRAVENOUS

## 2018-06-27 MED ORDER — HYDROMORPHONE HCL 1 MG/ML IJ SOLN
INTRAMUSCULAR | Status: AC
  Filled 2018-06-27: qty 2

## 2018-06-27 MED ORDER — SODIUM CHLORIDE 0.9 % IV SOLN
300.0000 mg/m2 | Freq: Once | INTRAVENOUS | Status: AC
  Administered 2018-06-27: 11:00:00 620 mg via INTRAVENOUS
  Filled 2018-06-27: qty 31

## 2018-06-27 MED ORDER — HEPARIN SOD (PORK) LOCK FLUSH 100 UNIT/ML IV SOLN
500.0000 [IU] | Freq: Once | INTRAVENOUS | Status: AC | PRN
  Administered 2018-06-27: 12:00:00 500 [IU]
  Filled 2018-06-27: qty 5

## 2018-06-27 MED ORDER — DEXAMETHASONE SODIUM PHOSPHATE 10 MG/ML IJ SOLN
INTRAMUSCULAR | Status: AC
  Filled 2018-06-27: qty 1

## 2018-06-27 MED ORDER — SODIUM CHLORIDE 0.9 % IV SOLN
Freq: Once | INTRAVENOUS | Status: AC
  Filled 2018-06-27: qty 250

## 2018-06-27 MED ORDER — SODIUM CHLORIDE 0.9 % IV SOLN
Freq: Once | INTRAVENOUS | Status: AC
  Administered 2018-06-27: 09:00:00 via INTRAVENOUS
  Filled 2018-06-27: qty 250

## 2018-06-27 MED ORDER — DEXTROSE 5 % IV SOLN
28.6000 mg/m2 | Freq: Once | INTRAVENOUS | Status: AC
  Administered 2018-06-27: 60 mg via INTRAVENOUS
  Filled 2018-06-27: qty 30

## 2018-06-27 MED ORDER — SODIUM CHLORIDE 0.9% FLUSH
10.0000 mL | INTRAVENOUS | Status: DC | PRN
  Administered 2018-06-27: 12:00:00 10 mL
  Filled 2018-06-27: qty 10

## 2018-06-27 NOTE — Progress Notes (Signed)
Ok to treat with creatinine of 2.3 per Dr [name]. dph

## 2018-06-27 NOTE — Patient Instructions (Signed)
Carfilzomib injection What is this medicine? CARFILZOMIB (kar FILZ oh mib) targets a specific protein within cancer cells and stops the cancer cells from growing. It is used to treat multiple myeloma. This medicine may be used for other purposes; ask your health care provider or pharmacist if you have questions. COMMON BRAND NAME(S): KYPROLIS What should I tell my health care provider before I take this medicine? They need to know if you have any of these conditions: -heart disease -history of blood clots -irregular heartbeat -kidney disease -liver disease -lung or breathing disease -an unusual or allergic reaction to carfilzomib, or other medicines, foods, dyes, or preservatives -pregnant or trying to get pregnant -breast-feeding How should I use this medicine? This medicine is for injection or infusion into a vein. It is given by a health care professional in a hospital or clinic setting. Talk to your pediatrician regarding the use of this medicine in children. Special care may be needed. Overdosage: If you think you have taken too much of this medicine contact a poison control center or emergency room at once. NOTE: This medicine is only for you. Do not share this medicine with others. What if I miss a dose? It is important not to miss your dose. Call your doctor or health care professional if you are unable to keep an appointment. What may interact with this medicine? Interactions are not expected. Give your health care provider a list of all the medicines, herbs, non-prescription drugs, or dietary supplements you use. Also tell them if you smoke, drink alcohol, or use illegal drugs. Some items may interact with your medicine. This list may not describe all possible interactions. Give your health care provider a list of all the medicines, herbs, non-prescription drugs, or dietary supplements you use. Also tell them if you smoke, drink alcohol, or use illegal drugs. Some items may  interact with your medicine. What should I watch for while using this medicine? Your condition will be monitored carefully while you are receiving this medicine. Report any side effects. Continue your course of treatment even though you feel ill unless your doctor tells you to stop. You may need blood work done while you are taking this medicine. Do not become pregnant while taking this medicine or for at least 6 months after stopping it. Women should inform their doctor if they wish to become pregnant or think they might be pregnant. There is a potential for serious side effects to an unborn child. Men should not father a child while taking this medicine and for at least 3 months after stopping it. Talk to your health care professional or pharmacist for more information. Do not breast-feed an infant while taking this medicine or for 2 weeks after the last dose. Check with your doctor or health care professional if you get an attack of severe diarrhea, nausea and vomiting, or if you sweat a lot. The loss of too much body fluid can make it dangerous for you to take this medicine. You may get dizzy. Do not drive, use machinery, or do anything that needs mental alertness until you know how this medicine affects you. Do not stand or sit up quickly, especially if you are an older patient. This reduces the risk of dizzy or fainting spells. What side effects may I notice from receiving this medicine? Side effects that you should report to your doctor or health care professional as soon as possible: -allergic reactions like skin rash, itching or hives, swelling of the face, lips, or  tongue -confusion -dizziness -feeling faint or lightheaded -fever or chills -palpitations -seizures -signs and symptoms of bleeding such as bloody or black, tarry stools; red or dark-brown urine; spitting up blood or brown material that looks like coffee grounds; red spots on the skin; unusual bruising or bleeding including from  the eye, gums, or nose -signs and symptoms of a blood clot such as breathing problems; changes in vision; chest pain; severe, sudden headache; pain, swelling, warmth in the leg; trouble speaking; sudden numbness or weakness of the face, arm or leg -signs and symptoms of kidney injury like trouble passing urine or change in the amount of urine -signs and symptoms of liver injury like dark yellow or brown urine; general ill feeling or flu-like symptoms; light-colored stools; loss of appetite; nausea; right upper belly pain; unusually weak or tired; yellowing of the eyes or skin Side effects that usually do not require medical attention (report to your doctor or health care professional if they continue or are bothersome): -back pain -cough -diarrhea -headache -muscle cramps -vomiting This list may not describe all possible side effects. Call your doctor for medical advice about side effects. You may report side effects to FDA at 1-800-FDA-1088. Where should I keep my medicine? This drug is given in a hospital or clinic and will not be stored at home. NOTE: This sheet is a summary. It may not cover all possible information. If you have questions about this medicine, talk to your doctor, pharmacist, or health care provider.  2019 Elsevier/Gold Standard (2016-11-25 14:07:13) Cyclophosphamide injection What is this medicine? CYCLOPHOSPHAMIDE (sye kloe FOSS fa mide) is a chemotherapy drug. It slows the growth of cancer cells. This medicine is used to treat many types of cancer like lymphoma, myeloma, leukemia, breast cancer, and ovarian cancer, to name a few. This medicine may be used for other purposes; ask your health care provider or pharmacist if you have questions. COMMON BRAND NAME(S): Cytoxan, Neosar What should I tell my health care provider before I take this medicine? They need to know if you have any of these conditions: -blood disorders -history of other  chemotherapy -infection -kidney disease -liver disease -recent or ongoing radiation therapy -tumors in the bone marrow -an unusual or allergic reaction to cyclophosphamide, other chemotherapy, other medicines, foods, dyes, or preservatives -pregnant or trying to get pregnant -breast-feeding How should I use this medicine? This drug is usually given as an injection into a vein or muscle or by infusion into a vein. It is administered in a hospital or clinic by a specially trained health care professional. Talk to your pediatrician regarding the use of this medicine in children. Special care may be needed. Overdosage: If you think you have taken too much of this medicine contact a poison control center or emergency room at once. NOTE: This medicine is only for you. Do not share this medicine with others. What if I miss a dose? It is important not to miss your dose. Call your doctor or health care professional if you are unable to keep an appointment. What may interact with this medicine? This medicine may interact with the following medications: -amiodarone -amphotericin B -azathioprine -certain antiviral medicines for HIV or AIDS such as protease inhibitors (e.g., indinavir, ritonavir) and zidovudine -certain blood pressure medications such as benazepril, captopril, enalapril, fosinopril, lisinopril, moexipril, monopril, perindopril, quinapril, ramipril, trandolapril -certain cancer medications such as anthracyclines (e.g., daunorubicin, doxorubicin), busulfan, cytarabine, paclitaxel, pentostatin, tamoxifen, trastuzumab -certain diuretics such as chlorothiazide, chlorthalidone, hydrochlorothiazide, indapamide, metolazone -certain medicines   that treat or prevent blood clots like warfarin -certain muscle relaxants such as succinylcholine -cyclosporine -etanercept -indomethacin -medicines to increase blood counts like filgrastim, pegfilgrastim, sargramostim -medicines used as general  anesthesia -metronidazole -natalizumab This list may not describe all possible interactions. Give your health care provider a list of all the medicines, herbs, non-prescription drugs, or dietary supplements you use. Also tell them if you smoke, drink alcohol, or use illegal drugs. Some items may interact with your medicine. What should I watch for while using this medicine? Visit your doctor for checks on your progress. This drug may make you feel generally unwell. This is not uncommon, as chemotherapy can affect healthy cells as well as cancer cells. Report any side effects. Continue your course of treatment even though you feel ill unless your doctor tells you to stop. Drink water or other fluids as directed. Urinate often, even at night. In some cases, you may be given additional medicines to help with side effects. Follow all directions for their use. Call your doctor or health care professional for advice if you get a fever, chills or sore throat, or other symptoms of a cold or flu. Do not treat yourself. This drug decreases your body's ability to fight infections. Try to avoid being around people who are sick. This medicine may increase your risk to bruise or bleed. Call your doctor or health care professional if you notice any unusual bleeding. Be careful brushing and flossing your teeth or using a toothpick because you may get an infection or bleed more easily. If you have any dental work done, tell your dentist you are receiving this medicine. You may get drowsy or dizzy. Do not drive, use machinery, or do anything that needs mental alertness until you know how this medicine affects you. Do not become pregnant while taking this medicine or for 1 year after stopping it. Women should inform their doctor if they wish to become pregnant or think they might be pregnant. Men should not father a child while taking this medicine and for 4 months after stopping it. There is a potential for serious side  effects to an unborn child. Talk to your health care professional or pharmacist for more information. Do not breast-feed an infant while taking this medicine. This medicine may interfere with the ability to have a child. This medicine has caused ovarian failure in some women. This medicine has caused reduced sperm counts in some men. You should talk with your doctor or health care professional if you are concerned about your fertility. If you are going to have surgery, tell your doctor or health care professional that you have taken this medicine. What side effects may I notice from receiving this medicine? Side effects that you should report to your doctor or health care professional as soon as possible: -allergic reactions like skin rash, itching or hives, swelling of the face, lips, or tongue -low blood counts - this medicine may decrease the number of white blood cells, red blood cells and platelets. You may be at increased risk for infections and bleeding. -signs of infection - fever or chills, cough, sore throat, pain or difficulty passing urine -signs of decreased platelets or bleeding - bruising, pinpoint red spots on the skin, black, tarry stools, blood in the urine -signs of decreased red blood cells - unusually weak or tired, fainting spells, lightheadedness -breathing problems -dark urine -dizziness -palpitations -swelling of the ankles, feet, hands -trouble passing urine or change in the amount of urine -weight gain -  yellowing of the eyes or skin Side effects that usually do not require medical attention (report to your doctor or health care professional if they continue or are bothersome): -changes in nail or skin color -hair loss -missed menstrual periods -mouth sores -nausea, vomiting This list may not describe all possible side effects. Call your doctor for medical advice about side effects. You may report side effects to FDA at 1-800-FDA-1088. Where should I keep my  medicine? This drug is given in a hospital or clinic and will not be stored at home. NOTE: This sheet is a summary. It may not cover all possible information. If you have questions about this medicine, talk to your doctor, pharmacist, or health care provider.  2019 Elsevier/Gold Standard (2011-12-25 16:22:58)  

## 2018-06-27 NOTE — Telephone Encounter (Signed)
-----   Message from Volanda Napoleon, MD sent at 06/27/2018  4:26 PM EDT ----- Call - the spine xray does not show any fx or myeloma.  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  Orthopedic MD should be able to help this.  Laurey Arrow

## 2018-06-27 NOTE — Telephone Encounter (Signed)
Notified pt of results, pt verbalized understanding.  

## 2018-06-27 NOTE — Patient Instructions (Signed)
Implanted Port Insertion, Care After  This sheet gives you information about how to care for yourself after your procedure. Your health care provider may also give you more specific instructions. If you have problems or questions, contact your health care provider.  What can I expect after the procedure?  After the procedure, it is common to have:  · Discomfort at the port insertion site.  · Bruising on the skin over the port. This should improve over 3-4 days.  Follow these instructions at home:  Port care  · After your port is placed, you will get a manufacturer's information card. The card has information about your port. Keep this card with you at all times.  · Take care of the port as told by your health care provider. Ask your health care provider if you or a family member can get training for taking care of the port at home. A home health care nurse may also take care of the port.  · Make sure to remember what type of port you have.  Incision care         · Follow instructions from your health care provider about how to take care of your port insertion site. Make sure you:  ? Wash your hands with soap and water before and after you change your bandage (dressing). If soap and water are not available, use hand sanitizer.  ? Change your dressing as told by your health care provider.  ? Leave stitches (sutures), skin glue, or adhesive strips in place. These skin closures may need to stay in place for 2 weeks or longer. If adhesive strip edges start to loosen and curl up, you may trim the loose edges. Do not remove adhesive strips completely unless your health care provider tells you to do that.  · Check your port insertion site every day for signs of infection. Check for:  ? Redness, swelling, or pain.  ? Fluid or blood.  ? Warmth.  ? Pus or a bad smell.  Activity  · Return to your normal activities as told by your health care provider. Ask your health care provider what activities are safe for you.  · Do not  lift anything that is heavier than 10 lb (4.5 kg), or the limit that you are told, until your health care provider says that it is safe.  General instructions  · Take over-the-counter and prescription medicines only as told by your health care provider.  · Do not take baths, swim, or use a hot tub until your health care provider approves. Ask your health care provider if you may take showers. You may only be allowed to take sponge baths.  · Do not drive for 24 hours if you were given a sedative during your procedure.  · Wear a medical alert bracelet in case of an emergency. This will tell any health care providers that you have a port.  · Keep all follow-up visits as told by your health care provider. This is important.  Contact a health care provider if:  · You cannot flush your port with saline as directed, or you cannot draw blood from the port.  · You have a fever or chills.  · You have redness, swelling, or pain around your port insertion site.  · You have fluid or blood coming from your port insertion site.  · Your port insertion site feels warm to the touch.  · You have pus or a bad smell coming from the port   insertion site.  Get help right away if:  · You have chest pain or shortness of breath.  · You have bleeding from your port that you cannot control.  Summary  · Take care of the port as told by your health care provider. Keep the manufacturer's information card with you at all times.  · Change your dressing as told by your health care provider.  · Contact a health care provider if you have a fever or chills or if you have redness, swelling, or pain around your port insertion site.  · Keep all follow-up visits as told by your health care provider.  This information is not intended to replace advice given to you by your health care provider. Make sure you discuss any questions you have with your health care provider.  Document Released: 11/30/2012 Document Revised: 09/07/2017 Document Reviewed:  09/07/2017  Elsevier Interactive Patient Education © 2019 [employer]

## 2018-06-28 ENCOUNTER — Encounter (HOSPITAL_COMMUNITY): Payer: Self-pay | Admitting: Emergency Medicine

## 2018-06-28 ENCOUNTER — Emergency Department (HOSPITAL_COMMUNITY): Payer: Medicare Other

## 2018-06-28 ENCOUNTER — Other Ambulatory Visit: Payer: Self-pay

## 2018-06-28 ENCOUNTER — Emergency Department (HOSPITAL_COMMUNITY)
Admission: EM | Admit: 2018-06-28 | Discharge: 2018-06-28 | Disposition: A | Discharge status: Home / Self Care | Payer: Medicare Other | Attending: Emergency Medicine | Admitting: Emergency Medicine

## 2018-06-28 DIAGNOSIS — N189 Chronic kidney disease, unspecified: Secondary | ICD-10-CM | POA: Insufficient documentation

## 2018-06-28 DIAGNOSIS — E1122 Type 2 diabetes mellitus with diabetic chronic kidney disease: Secondary | ICD-10-CM | POA: Insufficient documentation

## 2018-06-28 DIAGNOSIS — M545 Low back pain, unspecified: Secondary | ICD-10-CM

## 2018-06-28 DIAGNOSIS — R600 Localized edema: Secondary | ICD-10-CM | POA: Diagnosis present

## 2018-06-28 DIAGNOSIS — E1140 Type 2 diabetes mellitus with diabetic neuropathy, unspecified: Secondary | ICD-10-CM | POA: Diagnosis not present

## 2018-06-28 DIAGNOSIS — Z79899 Other long term (current) drug therapy: Secondary | ICD-10-CM | POA: Diagnosis not present

## 2018-06-28 DIAGNOSIS — I129 Hypertensive chronic kidney disease with stage 1 through stage 4 chronic kidney disease, or unspecified chronic kidney disease: Secondary | ICD-10-CM | POA: Diagnosis not present

## 2018-06-28 DIAGNOSIS — Z7984 Long term (current) use of oral hypoglycemic drugs: Secondary | ICD-10-CM | POA: Insufficient documentation

## 2018-06-28 DIAGNOSIS — M25552 Pain in left hip: Secondary | ICD-10-CM | POA: Insufficient documentation

## 2018-06-28 DIAGNOSIS — G8929 Other chronic pain: Secondary | ICD-10-CM

## 2018-06-28 MED ORDER — LIDOCAINE 5 % EX PTCH: 1.0000 | MEDICATED_PATCH | CUTANEOUS | 0 refills | Status: AC

## 2018-06-28 MED ORDER — LIDOCAINE 5 % EX PTCH
1.0000 | MEDICATED_PATCH | CUTANEOUS | Status: DC
  Administered 2018-06-28: 1 via TRANSDERMAL
  Filled 2018-06-28: qty 1

## 2018-06-28 NOTE — ED Triage Notes (Signed)
Pt reports swelling from left hip to foot for couple years. Reports had chemo yesterday.

## 2018-06-28 NOTE — ED Provider Notes (Signed)
[facility] DEPT Provider Note   CSN: [id number] Arrival date & time: 06/28/18  0827    History   Chief Complaint Chief Complaint  Patient presents with  . Leg Swelling  . Back Pain    HPI [name] is a 68 y.o. male.     The history is provided by the patient.  Leg Pain  Location:  Buttock and hip Injury: no   Hip location:  L hip Buttock location:  L buttock Pain details:    Quality:  Aching   Radiates to:  L leg   Severity:  Mild   Onset quality:  Gradual   Timing:  Intermittent   Progression:  Waxing and waning Relieved by:  Immobilization and rest (narcotics) Worsened by:  Nothing Associated symptoms: back pain   Associated symptoms: no decreased ROM, no fatigue, no fever, no itching, no muscle weakness, no neck pain, no numbness, no stiffness, no swelling and no tingling   Risk factors comment:  Hx of multiple myeloma, negative lumbar xray yesterday for new findings. New pain mediction not working.    Past Medical History:  Diagnosis Date  . Anemia   . 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 Anemia, iron deficiency 03/12/2011  . Anxiety   . Anxiety disorder   . Arthralgia of multiple joints 12/31/2010  . Arthritis   . Constipation   . Diabetes mellitus without complication ([place])   . Diabetic neuropathy ([place])   . Diverticulosis   . Fibromyalgia   . GERD (gastroesophageal reflux disease)   . Headache   . Hemorrhoids   . Herpes zoster   . Hyperlipidemia   . Hypertension   . Hypotestosteronism 03/17/2012  . Multiple myeloma   . Multiple myeloma ([place])   . Myeloma ([place]) 12/31/2010  . Pneumonia   . Renal insufficiency   . Type 2 diabetes mellitus ([place])   . Urinary retention     Patient Active Problem List   Diagnosis Date Noted  . Cervical spondylosis with myelopathy 06/05/2016  . Anemia of chronic renal failure 12/17/2015  . Preventive measure 12/17/2015  . Type 2 diabetes mellitus ([place]) 06/05/2015  . Edema  extremities 06/05/2015  . Benign hypertension 06/05/2015  . Abdominal pain in male   . Right sided abdominal pain   . Lower abdominal pain   . Multiple myeloma ([place])   . Acute abdominal pain 11/14/2014  . Generalized abdominal pain   . Esophageal dysphagia   . Hypertensive lower esophageal sphincter   . Dysphagia 09/26/2014  . Rectal bleeding 09/26/2014  . Nausea with vomiting 09/26/2014  . Constipation 09/26/2014  . Hypotestosteronism 03/17/2012  . Can't get food down 01/17/2012  . Hematochezia 07/12/2011  . Anemia, iron deficiency 03/12/2011  . 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 Myeloma ([place]) 12/31/2010  . Diabetes mellitus, type II ([place]) 12/31/2010  . Arthralgia of multiple joints 12/31/2010  . GERD 03/09/2008  . DIVERTICULOSIS-COLON 03/09/2008  . Other dysphagia 03/09/2008    Past Surgical History:  Procedure Laterality Date  . CATARACT EXTRACTION     bi-lateral  . COLONOSCOPY    . COLONOSCOPY  07/13/2011   Procedure: COLONOSCOPY;  Surgeon: Ladene Artist, MD,FACG;  Location: [facility];  Service: Endoscopy;  Laterality: N/A;  . ESOPHAGOGASTRODUODENOSCOPY    . IR CHEST FLUORO  09/22/2016  . IR FLUORO GUIDE PORT INSERTION RIGHT  09/23/2016  . IR REMOVAL TUN ACCESS W/ PORT W/O FL MOD SED  09/23/2016  . IR US  GUIDE VASC ACCESS LEFT  09/23/2016  . KNEE ARTHROSCOPY     left  . KNEE SURGERY    . PORTACATH PLACEMENT    . POSTERIOR CERVICAL FUSION/FORAMINOTOMY N/A 06/05/2016   Procedure: LAMINECTOMY CERVICAL 3 - CERVICAL 6 WITH LATERAL MASS FUSION;  Surgeon: Consuella Lose, MD;  Location: [facility];  Service: Neurosurgery;  Laterality: N/A;  LAMINECTOMY CERVICAL 3 - CERVICAL 6 WITH LATERAL MASS FUSION  . TONSILLECTOMY    . TRANSURETHRAL RESECTION OF PROSTATE  01/2011   Dr. at [facility] in [place]        Home Medications    Prior to Admission medications   Medication Sig Start Date End Date Taking? Authorizing Provider  amLODipine (NORVASC) 10 MG tablet Take 10  mg by mouth daily.    [provider]  CARAFATE 1 GM/10ML suspension Take 1 g by mouth 4 (four) times daily.  07/28/11   [provider]  cyclobenzaprine (FLEXERIL) 10 MG tablet Take 10 mg by mouth 3 (three) times daily. 12/06/17   [provider]  dicyclomine (BENTYL) 10 MG capsule TAKE 1 CAPSULE BY MOUTH THREE TIMES A DAY Patient taking differently: Take 10 mg by mouth 3 (three) times daily before meals.  06/13/18   [place], Holli Humbles, NP  dronabinol (MARINOL) 2.5 MG capsule Take 1 capsule (2.5 mg total) by mouth 2 (two) times daily before a meal. 09/15/17   [place], Holli Humbles, NP  EMBEDA 20-0.8 MG CPCR Take 1 capsule by mouth 3 (three) times daily.  11/09/17   [provider]  furosemide (LASIX) 40 MG tablet Take 40 mg by mouth daily.  07/01/16   [provider]  gabapentin (NEURONTIN) 300 MG capsule Take 1 capsule (300 mg total) by mouth 3 (three) times daily. 06/20/18   [place], Holli Humbles, NP  glipiZIDE (GLUCOTROL) 5 MG tablet Take 5 mg by mouth 2 (two) times daily before a meal.     [provider]  HYDROcodone-acetaminophen (NORCO) 5-325 MG tablet Take 1-2 tablets by mouth every 6 (six) hours as needed. Patient taking differently: Take 1-2 tablets by mouth every 6 (six) hours as needed for moderate pain.  02/03/18   Veryl Speak, MD  lactulose (CHRONULAC) 10 GM/15ML solution Take 20 g by mouth daily.  03/22/18   [provider]  latanoprost (XALATAN) 0.005 % ophthalmic solution Place 1 drop into the right eye every evening.     [provider]  lidocaine (LIDODERM) 5 % Place 1 patch onto the skin daily for 30 doses. Remove & Discard patch within 12 hours or as directed by MD 06/28/18 07/28/18  Lennice Sites, DO  lidocaine-prilocaine (EMLA) cream Apply 1 application topically as needed. Patient not taking: Reported on 04/11/2018 10/05/16   Volanda Napoleon, MD  Linaclotide [facility]) 145 MCG CAPS capsule Take 1 capsule (145 mcg  total) by mouth daily before breakfast. Patient taking differently: Take 145 mcg by mouth daily as needed (constipation).  11/16/14   Zehr, Laban Emperor, PA-C  LORazepam (ATIVAN) 0.5 MG tablet Take 1 tablet (0.5 mg total) by mouth every 6 (six) hours as needed (Nausea or vomiting). 08/23/17   [place], Holli Humbles, NP  losartan (COZAAR) 25 MG tablet Take 25 mg by mouth. 07/01/16   [provider]  metoprolol succinate (TOPROL-XL) 100 MG 24 hr tablet Take 100 mg by mouth daily.  12/14/17   [provider]  mirabegron ER (MYRBETRIQ) 50 MG TB24 tablet Take 50 mg by mouth daily.  [provider]  Morphine-Naltrexone (EMBEDA) 30-1.2 MG CPCR Take 3 (three) times daily by mouth.    [provider]  omeprazole (PRILOSEC) 40 MG capsule TAKE 1 CAPSULE BY MOUTH TWICE A DAY Patient taking differently: Take 40 mg by mouth 2 (two) times daily.  05/16/18   [place], Holli Humbles, NP  ondansetron (ZOFRAN) 8 MG tablet TAKE 1 TABLET(8 MG) BY MOUTH TWICE DAILY AS NEEDED FOR NAUSEA OR VOMITING Patient taking differently: Take 8 mg by mouth 2 (two) times daily as needed for nausea or vomiting.  08/20/17   Volanda Napoleon, MD  orphenadrine (NORFLEX) 100 MG tablet Take 1 tablet (100 mg total) by mouth 2 (two) times daily. 05/22/16   Charlesetta Shanks, MD  oxyCODONE-acetaminophen (PERCOCET) 10-325 MG tablet Take 1 tablet by mouth every 6 (six) hours as needed for pain. 03/23/16   Volanda Napoleon, MD  pioglitazone (ACTOS) 30 MG tablet Take 30 mg by mouth daily. 03/06/18   [provider]  Polyvinyl Alcohol-Povidone (CLEAR EYES ALL SEASONS) 5-6 MG/ML SOLN Place 2 drops into both eyes 3 (three) times daily as needed (drye eyes.).     [provider]  potassium chloride SA (K-DUR,KLOR-CON) 20 MEQ tablet Take 1 tablet (20 mEq total) by mouth 2 (two) times daily. 11/07/14   Volanda Napoleon, MD  prochlorperazine (COMPAZINE) 10 MG tablet take 1 tablet by mouth every 6 hours if needed for nausea  and vomiting Patient taking differently: Take 10 mg by mouth every 6 (six) hours as needed for nausea or vomiting. take 1 tablet by mouth every 6 hours if needed for nausea and vomiting 05/10/17   [place], Holli Humbles, NP  RELISTOR 150 MG TABS Take 150 mg by mouth. 03/22/18   [provider]  Sennosides 25 MG TABS Take 25 mg by mouth daily as needed (constipation).     [provider]  Tafluprost, PF, (ZIOPTAN) 0.0015 % SOLN Place 1 drop into both eyes daily.     [provider]  Vitamin D, Ergocalciferol, (DRISDOL) 50000 UNITS CAPS capsule Take 50,000 Units by mouth every Saturday.     [provider]  XTAMPZA ER 13.5 MG C12A Take 1 capsule by mouth 2 (two) times daily. 06/16/18   [provider]  potassium chloride (KLOR-CON) 20 MEQ packet Take 20 mEq by mouth daily.   05/17/11  [provider]    Family History Family History  Problem Relation Age of Onset  . Breast cancer Sister   . Breast cancer Mother   . Heart disease Mother   . Diabetes Sister   . Bone cancer Father   . Heart disease Sister   . Heart disease Daughter   . Throat cancer Brother   . Stomach cancer Brother   . Colon cancer Neg Hx     Social History Social History   Tobacco Use  . Smoking status: Never Smoker  . Smokeless tobacco: Never Used  . Tobacco comment: never used tobacco  Substance Use Topics  . Alcohol use: No    Alcohol/week: 0.0 standard drinks  . Drug use: No     Allergies   Iodinated diagnostic agents and Iodine   Review of Systems Review of Systems  Constitutional: Negative for chills, fatigue and fever.  HENT: Negative for ear pain and sore throat.   Eyes: Negative for pain and visual disturbance.  Respiratory: Negative for cough and shortness of breath.   Cardiovascular: Negative for chest pain, palpitations and leg swelling.  Gastrointestinal: Negative for abdominal pain and vomiting.  Genitourinary: Negative for dysuria and  hematuria.  Musculoskeletal: Positive for back pain and gait problem. Negative for arthralgias, neck pain and stiffness.  Skin: Negative for color change, itching and rash.  Neurological: Negative for seizures and syncope.  All other systems reviewed and are negative.    Physical Exam Updated Vital Signs  ED Triage Vitals [06/28/18 0843]  Enc Vitals Group     BP (!) 162/75     Pulse Rate (!) 107     Resp 18     Temp 98.1 F (36.7 C)     Temp src      SpO2 100 %     Weight      Height      Head Circumference      Peak Flow      Pain Score      Pain Loc      Pain Edu?      Excl. in [place]?      Physical Exam Vitals signs and nursing note reviewed.  Constitutional:      Appearance: He is well-developed.  HENT:     Head: Normocephalic and atraumatic.     Nose: Nose normal.     Mouth/Throat:     Mouth: Mucous membranes are moist.  Eyes:     Extraocular Movements: Extraocular movements intact.     Conjunctiva/sclera: Conjunctivae normal.     Pupils: Pupils are equal, round, and reactive to light.  Neck:     Musculoskeletal: Neck supple.  Cardiovascular:     Rate and Rhythm: Normal rate and regular rhythm.     Pulses: Normal pulses.     Heart sounds: Normal heart sounds. No murmur.  Pulmonary:     Effort: Pulmonary effort is normal. No respiratory distress.     Breath sounds: Normal breath sounds.  Abdominal:     Palpations: Abdomen is soft.     Tenderness: There is no abdominal tenderness.  Musculoskeletal:        General: Tenderness (TTP left lower back and left hip, no midline spinal pain) present.     Right lower leg: Edema present.     Left lower leg: Edema present.  Skin:    General: Skin is warm and dry.     Capillary Refill: Capillary refill takes less than 2 seconds.  Neurological:     General: No focal deficit present.     Mental Status: He is alert and oriented to person, place, and time.     Cranial Nerves: No cranial nerve deficit.     Sensory: No  sensory deficit.     Motor: No weakness.     Coordination: Coordination normal.      ED Treatments / Results  Labs (all labs ordered are listed, but only abnormal results are displayed) Labs Reviewed - No data to display  EKG None  Radiology Dg Lumbar Spine Complete  Result Date: 06/27/2018 CLINICAL DATA:  Severe left-sided back pain. History of multiple myeloma. No injury. EXAM: LUMBAR SPINE - COMPLETE 4+ VIEW COMPARISON:  MRI 08/12/2017 FINDINGS: Vertebral body alignment and heights are normal. There is moderate spondylosis of the lumbar spine. Minimal disc space narrowing at the L4-5 and L5-S1 levels. No evidence of compression fracture or spondylolisthesis. Prominent right anterolateral syndesmophyte at the L2-3 level. IMPRESSION: No acute findings. Moderate spondylosis of the lumbar spine with prominent paravertebral osteophyte formation. Minimal disc disease at the L4-5 and L5-S1 levels. Electronically Signed  By: [name] M.D.   On: 06/27/2018 08:59   Dg Hip Unilat With Pelvis 2-3 Views Left  Result Date: 06/28/2018 CLINICAL DATA:  Pain and swelling, left hip EXAM: DG HIP (WITH OR WITHOUT PELVIS) 2-3V LEFT COMPARISON:  None. FINDINGS: No fracture or dislocation of the left hip. There is moderate to severe left femoroacetabular osteophytosis with mild superior joint space narrowing, this appearance relatively symmetric to the right hip. IMPRESSION: No fracture or dislocation of the left hip. There is moderate to severe left femoroacetabular osteophytosis with mild superior joint space narrowing, this appearance relatively symmetric to the right hip. Electronically Signed   By: Eddie Candle M.D.   On: 06/28/2018 09:30    Procedures Procedures (including critical care time)  Medications Ordered in ED Medications  lidocaine (LIDODERM) 5 % 1 patch (has no administration in time range)     Initial Impression / Assessment and Plan / ED Course  I have reviewed the triage vital  signs and the nursing notes.  Pertinent labs & imaging results that were available during my care of the patient were reviewed by me and considered in my medical decision making (see chart for details).     [name] is a 68 year old male with history of multiple myeloma, diabetes who presents to the ED with left lower back and left hip pain.  Patient with normal vitals.  No fever.  Symptoms have been on and off for several months to years.  Was started on new pain medication in addition to his narcotics without much relief.  Had a lumbar x-ray yesterday that showed no acute findings.  Patient denies any loss of bowel or bladder.  No symptoms of cauda equina. Leg swelling b/l in symmetric, patient states chronic. No lower leg tenderness, no concern for DVT.  No fever.  No concern for infectious process.  On exam he is tender in the paraspinal region in the lumbar area on the left including the left hip area and buttocks.  X-ray of the pelvis was ordered and showed no acute findings.  Patient was given lidocaine patch.  At this time recommend that he continue home pain medications and will give prescription for lidocaine patch.  Recommend follow-up with primary care doctor.  Given return precautions.  Discharged in good condition.  This chart was dictated using voice recognition software.  Despite best efforts to proofread,  errors can occur which can change the documentation meaning.    Final Clinical Impressions(s) / ED Diagnoses   Final diagnoses:  Chronic left-sided low back pain, unspecified whether sciatica present  Pain of left hip joint    ED Discharge Orders         Ordered    lidocaine (LIDODERM) 5 %  Every 24 hours     06/28/18 Royal Kunia, [place], DO 06/28/18 0940

## 2018-06-30 ENCOUNTER — Other Ambulatory Visit: Payer: Self-pay | Admitting: General Surgery

## 2018-07-01 ENCOUNTER — Other Ambulatory Visit: Payer: Self-pay | Admitting: *Deleted

## 2018-07-01 DIAGNOSIS — C9000 Multiple myeloma not having achieved remission: Secondary | ICD-10-CM

## 2018-07-04 ENCOUNTER — Emergency Department (HOSPITAL_BASED_OUTPATIENT_CLINIC_OR_DEPARTMENT_OTHER): Payer: Medicare Other

## 2018-07-04 ENCOUNTER — Emergency Department (HOSPITAL_BASED_OUTPATIENT_CLINIC_OR_DEPARTMENT_OTHER)
Admission: EM | Admit: 2018-07-04 | Discharge: 2018-07-04 | Disposition: A | Discharge status: Home / Self Care | Payer: Medicare Other | Attending: Emergency Medicine | Admitting: Emergency Medicine

## 2018-07-04 ENCOUNTER — Other Ambulatory Visit: Payer: Self-pay | Admitting: Family

## 2018-07-04 ENCOUNTER — Inpatient Hospital Stay (HOSPITAL_BASED_OUTPATIENT_CLINIC_OR_DEPARTMENT_OTHER): Payer: Medicare Other | Admitting: Hematology & Oncology

## 2018-07-04 ENCOUNTER — Encounter: Payer: Self-pay | Admitting: Hematology & Oncology

## 2018-07-04 ENCOUNTER — Inpatient Hospital Stay: Payer: Medicare Other

## 2018-07-04 ENCOUNTER — Encounter (HOSPITAL_BASED_OUTPATIENT_CLINIC_OR_DEPARTMENT_OTHER): Payer: Self-pay

## 2018-07-04 ENCOUNTER — Other Ambulatory Visit: Payer: Self-pay

## 2018-07-04 VITALS — Wt 197.0 lb

## 2018-07-04 DIAGNOSIS — E119 Type 2 diabetes mellitus without complications: Secondary | ICD-10-CM | POA: Diagnosis not present

## 2018-07-04 DIAGNOSIS — E349 Endocrine disorder, unspecified: Secondary | ICD-10-CM

## 2018-07-04 DIAGNOSIS — R609 Edema, unspecified: Secondary | ICD-10-CM | POA: Diagnosis present

## 2018-07-04 DIAGNOSIS — I10 Essential (primary) hypertension: Secondary | ICD-10-CM | POA: Diagnosis not present

## 2018-07-04 DIAGNOSIS — D509 Iron deficiency anemia, unspecified: Secondary | ICD-10-CM | POA: Diagnosis not present

## 2018-07-04 DIAGNOSIS — C9000 Multiple myeloma not having achieved remission: Secondary | ICD-10-CM

## 2018-07-04 DIAGNOSIS — D508 Other iron deficiency anemias: Secondary | ICD-10-CM

## 2018-07-04 DIAGNOSIS — K529 Noninfective gastroenteritis and colitis, unspecified: Secondary | ICD-10-CM

## 2018-07-04 DIAGNOSIS — Z7984 Long term (current) use of oral hypoglycemic drugs: Secondary | ICD-10-CM | POA: Diagnosis not present

## 2018-07-04 DIAGNOSIS — G629 Polyneuropathy, unspecified: Secondary | ICD-10-CM

## 2018-07-04 DIAGNOSIS — R140 Abdominal distension (gaseous): Secondary | ICD-10-CM

## 2018-07-04 DIAGNOSIS — Z79899 Other long term (current) drug therapy: Secondary | ICD-10-CM | POA: Insufficient documentation

## 2018-07-04 DIAGNOSIS — M797 Fibromyalgia: Secondary | ICD-10-CM | POA: Diagnosis not present

## 2018-07-04 DIAGNOSIS — I83893 Varicose veins of bilateral lower extremities with other complications: Secondary | ICD-10-CM

## 2018-07-04 DIAGNOSIS — Z299 Encounter for prophylactic measures, unspecified: Secondary | ICD-10-CM

## 2018-07-04 LAB — CMP (CANCER CENTER ONLY)
ALT: 9 U/L (ref 0–44)
AST: 14 U/L — ABNORMAL LOW (ref 15–41)
Albumin: 4.1 g/dL (ref 3.5–5.0)
Alkaline Phosphatase: 51 U/L (ref 38–126)
Anion gap: 9 (ref 5–15)
BUN: 25 mg/dL — ABNORMAL HIGH (ref 8–23)
CO2: 28 mmol/L (ref 22–32)
Calcium: 9.2 mg/dL (ref 8.9–10.3)
Chloride: 103 mmol/L (ref 98–111)
Creatinine: 2.15 mg/dL — ABNORMAL HIGH (ref 0.61–1.24)
GFR, Est AFR Am: 35 mL/min — ABNORMAL LOW (ref >60)
GFR, Estimated: 30 mL/min — ABNORMAL LOW (ref >60)
Glucose, Bld: 138 mg/dL — ABNORMAL HIGH (ref 70–99)
Potassium: 3.7 mmol/L (ref 3.5–5.1)
Sodium: 140 mmol/L (ref 135–145)
Total Bilirubin: 0.5 mg/dL (ref 0.3–1.2)
Total Protein: 6.5 g/dL (ref 6.5–8.1)

## 2018-07-04 LAB — CBC WITH DIFFERENTIAL (CANCER CENTER ONLY)
Abs Immature Granulocytes: 0.05 10*3/uL (ref 0.00–0.07)
Basophils Absolute: 0 10*3/uL (ref 0.0–0.1)
Basophils Relative: 0 %
Eosinophils Absolute: 0 10*3/uL (ref 0.0–0.5)
Eosinophils Relative: 0 %
HCT: 32.9 % — ABNORMAL LOW (ref 39.0–52.0)
Hemoglobin: 10.6 g/dL — ABNORMAL LOW (ref 13.0–17.0)
Immature Granulocytes: 1 %
Lymphocytes Relative: 6 %
Lymphs Abs: 0.5 10*3/uL — ABNORMAL LOW (ref 0.7–4.0)
MCH: 26.6 pg (ref 26.0–34.0)
MCHC: 32.2 g/dL (ref 30.0–36.0)
MCV: 82.7 fL (ref 80.0–100.0)
Monocytes Absolute: 0.8 10*3/uL (ref 0.1–1.0)
Monocytes Relative: 9 %
Neutro Abs: 7.5 10*3/uL (ref 1.7–7.7)
Neutrophils Relative %: 84 %
Platelet Count: 153 10*3/uL (ref 150–400)
RBC: 3.98 MIL/uL — ABNORMAL LOW (ref 4.22–5.81)
RDW: 19.9 % — ABNORMAL HIGH (ref 11.5–15.5)
WBC Count: 8.9 10*3/uL (ref 4.0–10.5)
nRBC: 0 % (ref 0.0–0.2)

## 2018-07-04 LAB — BRAIN NATRIURETIC PEPTIDE: B Natriuretic Peptide: 24.2 pg/mL (ref 0.0–100.0)

## 2018-07-04 MED ORDER — MESALAMINE 1.2 G PO TBEC: 1.2000 g | DELAYED_RELEASE_TABLET | Freq: Every day | ORAL | 2 refills | Status: DC

## 2018-07-04 MED ORDER — HEPARIN SOD (PORK) LOCK FLUSH 100 UNIT/ML IV SOLN
500.0000 [IU] | Freq: Once | INTRAVENOUS | Status: AC
  Administered 2018-07-04: 500 [IU] via INTRAVENOUS
  Filled 2018-07-04: qty 5

## 2018-07-04 MED ORDER — HYDROCODONE-ACETAMINOPHEN 5-325 MG PO TABS: 1.0000 | ORAL_TABLET | Freq: Once | ORAL | Status: DC

## 2018-07-04 MED ORDER — SODIUM CHLORIDE 0.9% FLUSH
10.0000 mL | Freq: Once | INTRAVENOUS | Status: AC
  Administered 2018-07-04: 10 mL
  Filled 2018-07-04: qty 10

## 2018-07-04 NOTE — Addendum Note (Signed)
Addended by: Perlie Gold on: 07/04/2018 09:24 AM   Modules accepted: Orders

## 2018-07-04 NOTE — ED Provider Notes (Addendum)
[facility] EMERGENCY DEPARTMENT Provider Note   CSN: [id number] Arrival date & time: 07/04/18  1253    History   Chief Complaint Chief Complaint  Patient presents with  . Edema    HPI [name] is a 68 y.o. male.     68 year old male past medical history of type 2 diabetes, hypercholesterolemia, multiple myeloma who presents emergency department for leg swelling and pain.  Patient reports that this is been going on for several months.  Patient reports that he gets a sharp pain from his back down his entire left leg and it feels weak sometimes.  Reports bilateral lower extremity edema for the last month.  The edema gets better when he elevates his legs and gets worse when he is walking for a while.  He endorses some very mild shortness of breath with exertion.  Denies any chest pain, nausea, vomiting, cough.  He has not tried anything for relief.     Past Medical History:  Diagnosis Date  . Anemia   . 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 Anemia, iron deficiency 03/12/2011  . Anxiety   . Anxiety disorder   . Arthralgia of multiple joints 12/31/2010  . Arthritis   . Constipation   . Diabetes mellitus without complication ([place])   . Diabetic neuropathy ([place])   . Diverticulosis   . Fibromyalgia   . GERD (gastroesophageal reflux disease)   . Headache   . Hemorrhoids   . Herpes zoster   . Hyperlipidemia   . Hypertension   . Hypotestosteronism 03/17/2012  . Multiple myeloma   . Multiple myeloma ([place])   . Myeloma ([place]) 12/31/2010  . Pneumonia   . Renal insufficiency   . Type 2 diabetes mellitus ([place])   . Urinary retention     Patient Active Problem List   Diagnosis Date Noted  . Cervical spondylosis with myelopathy 06/05/2016  . Anemia of chronic renal failure 12/17/2015  . Preventive measure 12/17/2015  . Type 2 diabetes mellitus ([place]) 06/05/2015  . Edema extremities 06/05/2015  . Benign hypertension 06/05/2015  . Abdominal pain in male   . Right  sided abdominal pain   . Lower abdominal pain   . Multiple myeloma ([place])   . Acute abdominal pain 11/14/2014  . Generalized abdominal pain   . Esophageal dysphagia   . Hypertensive lower esophageal sphincter   . Dysphagia 09/26/2014  . Rectal bleeding 09/26/2014  . Nausea with vomiting 09/26/2014  . Constipation 09/26/2014  . Hypotestosteronism 03/17/2012  . Can't get food down 01/17/2012  . Hematochezia 07/12/2011  . Anemia, iron deficiency 03/12/2011  . 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 Myeloma ([place]) 12/31/2010  . Diabetes mellitus, type II ([place]) 12/31/2010  . Arthralgia of multiple joints 12/31/2010  . GERD 03/09/2008  . DIVERTICULOSIS-COLON 03/09/2008  . Other dysphagia 03/09/2008    Past Surgical History:  Procedure Laterality Date  . CATARACT EXTRACTION     bi-lateral  . COLONOSCOPY    . COLONOSCOPY  07/13/2011   Procedure: COLONOSCOPY;  Surgeon: Ladene Artist, MD,FACG;  Location: [facility];  Service: Endoscopy;  Laterality: N/A;  . ESOPHAGOGASTRODUODENOSCOPY    . IR CHEST FLUORO  09/22/2016  . IR FLUORO GUIDE PORT INSERTION RIGHT  09/23/2016  . IR REMOVAL TUN ACCESS W/ PORT W/O FL MOD SED  09/23/2016  . IR US GUIDE VASC ACCESS LEFT  09/23/2016  . KNEE ARTHROSCOPY     left  . KNEE SURGERY    . PORTACATH PLACEMENT    .  POSTERIOR CERVICAL FUSION/FORAMINOTOMY N/A 06/05/2016   Procedure: LAMINECTOMY CERVICAL 3 - CERVICAL 6 WITH LATERAL MASS FUSION;  Surgeon: Consuella Lose, MD;  Location: [facility];  Service: Neurosurgery;  Laterality: N/A;  LAMINECTOMY CERVICAL 3 - CERVICAL 6 WITH LATERAL MASS FUSION  . TONSILLECTOMY    . TRANSURETHRAL RESECTION OF PROSTATE  01/2011   Dr. at [facility] in [place]        Home Medications    Prior to Admission medications   Medication Sig Start Date End Date Taking? Authorizing Provider  amLODipine (NORVASC) 10 MG tablet Take 10 mg by mouth daily.    [provider]  CARAFATE 1 GM/10ML suspension Take 1 g by  mouth 4 (four) times daily.  07/28/11   [provider]  cyclobenzaprine (FLEXERIL) 10 MG tablet Take 10 mg by mouth 3 (three) times daily. 12/06/17   [provider]  dicyclomine (BENTYL) 10 MG capsule TAKE 1 CAPSULE BY MOUTH THREE TIMES A DAY Patient taking differently: Take 10 mg by mouth 3 (three) times daily before meals.  06/13/18   [place], Holli Humbles, NP  dronabinol (MARINOL) 2.5 MG capsule Take 1 capsule (2.5 mg total) by mouth 2 (two) times daily before a meal. 09/15/17   [place], Holli Humbles, NP  EMBEDA 20-0.8 MG CPCR Take 1 capsule by mouth 3 (three) times daily.  11/09/17   [provider]  furosemide (LASIX) 40 MG tablet Take 40 mg by mouth daily.  07/01/16   [provider]  gabapentin (NEURONTIN) 300 MG capsule TAKE 1 CAPSULE BY MOUTH THREE TIMES A DAY 07/12/18   [place], Holli Humbles, NP  glipiZIDE (GLUCOTROL) 5 MG tablet Take 5 mg by mouth 2 (two) times daily before a meal.     [provider]  HYDROcodone-acetaminophen (NORCO) 5-325 MG tablet Take 1-2 tablets by mouth every 6 (six) hours as needed. Patient taking differently: Take 1-2 tablets by mouth every 6 (six) hours as needed for moderate pain.  02/03/18   Veryl Speak, MD  lactulose (CHRONULAC) 10 GM/15ML solution Take 20 g by mouth daily.  03/22/18   [provider]  latanoprost (XALATAN) 0.005 % ophthalmic solution Place 1 drop into the right eye every evening.     [provider]  lidocaine (LIDODERM) 5 % Place 1 patch onto the skin daily for 30 doses. Remove & Discard patch within 12 hours or as directed by MD 06/28/18 07/28/18  Lennice Sites, DO  lidocaine-prilocaine (EMLA) cream Apply 1 application topically as needed. Patient not taking: Reported on 04/11/2018 10/05/16   Volanda Napoleon, MD  Linaclotide [facility]) 145 MCG CAPS capsule Take 1 capsule (145 mcg total) by mouth daily before breakfast. Patient taking differently: Take 145 mcg by mouth daily as needed  (constipation).  11/16/14   Zehr, Laban Emperor, PA-C  LORazepam (ATIVAN) 0.5 MG tablet Take 1 tablet (0.5 mg total) by mouth every 6 (six) hours as needed (Nausea or vomiting). 08/23/17   [place], Holli Humbles, NP  losartan (COZAAR) 25 MG tablet Take 25 mg by mouth. 07/01/16   [provider]  mesalamine (LIALDA) 1.2 g EC tablet Take 1 tablet (1.2 g total) by mouth daily with breakfast. 07/04/18   [place], Holli Humbles, NP  metoprolol succinate (TOPROL-XL) 100 MG 24 hr tablet Take 100 mg by mouth daily.  12/14/17   [provider]  mirabegron ER (MYRBETRIQ) 50 MG TB24 tablet Take 50 mg by mouth daily.    [provider]  Morphine-Naltrexone (  EMBEDA) 30-1.2 MG CPCR Take 3 (three) times daily by mouth.    [provider]  omeprazole (PRILOSEC) 40 MG capsule TAKE 1 CAPSULE BY MOUTH TWICE A DAY Patient taking differently: Take 40 mg by mouth 2 (two) times daily.  05/16/18   [place], Holli Humbles, NP  ondansetron (ZOFRAN) 8 MG tablet TAKE 1 TABLET(8 MG) BY MOUTH TWICE DAILY AS NEEDED FOR NAUSEA OR VOMITING Patient taking differently: Take 8 mg by mouth 2 (two) times daily as needed for nausea or vomiting.  08/20/17   Volanda Napoleon, MD  orphenadrine (NORFLEX) 100 MG tablet Take 1 tablet (100 mg total) by mouth 2 (two) times daily. 05/22/16   Charlesetta Shanks, MD  oxyCODONE-acetaminophen (PERCOCET) 10-325 MG tablet Take 1 tablet by mouth every 6 (six) hours as needed for pain. 03/23/16   Volanda Napoleon, MD  pioglitazone (ACTOS) 30 MG tablet Take 30 mg by mouth daily. 03/06/18   [provider]  Polyvinyl Alcohol-Povidone (CLEAR EYES ALL SEASONS) 5-6 MG/ML SOLN Place 2 drops into both eyes 3 (three) times daily as needed (drye eyes.).     [provider]  potassium chloride SA (K-DUR,KLOR-CON) 20 MEQ tablet Take 1 tablet (20 mEq total) by mouth 2 (two) times daily. 11/07/14   Volanda Napoleon, MD  prochlorperazine (COMPAZINE) 10 MG tablet take 1 tablet by mouth every 6  hours if needed for nausea and vomiting Patient taking differently: Take 10 mg by mouth every 6 (six) hours as needed for nausea or vomiting. take 1 tablet by mouth every 6 hours if needed for nausea and vomiting 05/10/17   [place], Holli Humbles, NP  RELISTOR 150 MG TABS Take 150 mg by mouth. 03/22/18   [provider]  Sennosides 25 MG TABS Take 25 mg by mouth daily as needed (constipation).     [provider]  Tafluprost, PF, (ZIOPTAN) 0.0015 % SOLN Place 1 drop into both eyes daily.     [provider]  Vitamin D, Ergocalciferol, (DRISDOL) 50000 UNITS CAPS capsule Take 50,000 Units by mouth every Saturday.     [provider]  XTAMPZA ER 13.5 MG C12A Take 1 capsule by mouth 2 (two) times daily. 06/16/18   [provider]  potassium chloride (KLOR-CON) 20 MEQ packet Take 20 mEq by mouth daily.   05/17/11  [provider]    Family History Family History  Problem Relation Age of Onset  . Breast cancer Sister   . Breast cancer Mother   . Heart disease Mother   . Diabetes Sister   . Bone cancer Father   . Heart disease Sister   . Heart disease Daughter   . Throat cancer Brother   . Stomach cancer Brother   . Colon cancer Neg Hx     Social History Social History   Tobacco Use  . Smoking status: Never Smoker  . Smokeless tobacco: Never Used  Substance Use Topics  . Alcohol use: No    Alcohol/week: 0.0 standard drinks  . Drug use: No     Allergies   Iodinated diagnostic agents and Iodine   Review of Systems Review of Systems  Constitutional: Negative for chills and fever.  HENT: Negative for ear pain and sore throat.   Respiratory: Positive for shortness of breath. Negative for cough.   Cardiovascular: Positive for leg swelling. Negative for chest pain and palpitations.  Gastrointestinal: Negative for abdominal pain and vomiting.  Genitourinary: Negative for dysuria and hematuria.  Musculoskeletal: Positive  for  arthralgias. Negative for back pain, neck pain and neck stiffness.  Skin: Negative for color change and rash.  Neurological: Negative for syncope.  Hematological: Does not bruise/bleed easily.  All other systems reviewed and are negative.    Physical Exam Updated Vital Signs BP 137/68 (BP Location: Left Arm)   Pulse (!) 110   Temp 98.7 F (37.1 C) (Oral)   Resp 14   SpO2 100%   Physical Exam Vitals signs and nursing note reviewed.  Constitutional:      Appearance: Normal appearance.  HENT:     Head: Normocephalic.  Eyes:     Conjunctiva/sclera: Conjunctivae normal.  Pulmonary:     Effort: Pulmonary effort is normal.  Musculoskeletal:     Right lower leg: Edema present.     Left lower leg: Edema present.  Skin:    General: Skin is dry.     Capillary Refill: Capillary refill takes less than 2 seconds.     Findings: No erythema or rash.  Neurological:     Mental Status: He is alert.     Sensory: No sensory deficit.     Motor: No weakness.     Coordination: Coordination normal.     Gait: Gait normal.     Deep Tendon Reflexes: Reflexes normal.  Psychiatric:        Mood and Affect: Mood normal.      ED Treatments / Results  Labs (all labs ordered are listed, but only abnormal results are displayed) Labs Reviewed  BRAIN NATRIURETIC PEPTIDE    EKG None  Radiology No results found.  Procedures Procedures (including critical care time)  Medications Ordered in ED Medications - No data to display   Initial Impression / Assessment and Plan / ED Course  I have reviewed the triage vital signs and the nursing notes.  Pertinent labs & imaging results that were available during my care of the patient were reviewed by me and considered in my medical decision making (see chart for details).  Clinical Course as of Jul 22 1851  Mon Jul 04, 2018  1502 I reviewed oncology notes from today. They have already addressed patient's complaints. Patient has echo scheduled  and advised to increase his lasix medications. Patient also had lumbar xray on the 5th of this month revealing severe arthritis. I suspect his left leg pain is related to sciatica from lumbar spondylosis. His edema may be related to ?CHF. He endorses only mild SOB with exertion but no CP. His main complaint is his leg pain. Bilateral LE doppler reveals baker cysts but no DVT. Normal chest xray and BNP.  I reiterated to the patient the importance of increasing his Lasix, wearing his compression stockings as much as possible and elevating his legs.  Advised for him to follow-up with his primary care doctor and oncologist.   [KM]    Clinical Course User Index [KM] Alveria Apley, PA-C         Final Clinical Impressions(s) / ED Diagnoses   Final diagnoses:  Varicose veins of leg with swelling, bilateral    ED Discharge Orders    None       Kristine Royal 07/04/18 1526    Blanchie Dessert, MD 07/05/18 2151    Alveria Apley, PA-C 07/22/18 1853    Blanchie Dessert, MD 07/23/18 [phone]

## 2018-07-04 NOTE — Discharge Instructions (Addendum)
Increase your lasix for now to 80mg  daily as instructed by your oncologist. Thank you for allowing me to care for you today. Please return to the emergency department if you have new or worsening symptoms. Take your medications as instructed.

## 2018-07-04 NOTE — Patient Instructions (Signed)

## 2018-07-04 NOTE — Progress Notes (Unsigned)
a 

## 2018-07-04 NOTE — Progress Notes (Signed)
Hematology and Oncology Follow Up Visit  [name] [id number] 03-20-1950 68 y.o. 07/04/2018   Principle Diagnosis:  IgG kappa myeloma Anemia secondary to renal insufficiency Intermittent iron - deficiency anemia Hypotestosteronemia  Current Therapy:   Aredia 60 mg IV q 3 months-- next dose 06/2018 Aranesp 300 mcg subcu as needed for hemoglobin less than 11 DepoTestosterone400 mg q 4 weeks IV iron as indicated Kyprolis/Cytoxan - s/p cycle18   Interim History:  [name] is here today for follow-up and treatment.  With him.  Of course family doctor for his diabetes doctor.  He is not going to be treated today.  He has a lot of swelling.  Not sure why he has this swelling.  Has had some abdominal distention.  Says he not eating all that much.  He always has issues with pain.  I think he has fibromyalgia.  He has had no cough.  There is no bleeding.  There is no obvious change in bowel or bladder habits.  His last myeloma studies did not show a monoclonal spike in his blood.  His IgG level was 754 mg/dL.  His kappa light chain was 2.2 mg/dL.  Overall, his performance status is ECOG 0.     Medications:  Allergies as of 07/04/2018      Reactions   Iodinated Diagnostic Agents Rash   Patient states he was instructed not to take IV contrast.  In 2008 he had an unknown reaction, and was told not to take it again.  He was also told not to take it due to his kidneys.   Iodine Anxiety, Rash, Other (See Comments)   Didn't feel right "instructed not to take per MD--something with his port"      Medication List       Accurate as of Jul 04, 2018  8:55 AM. If you have any questions, ask your nurse or doctor.        amLODipine 10 MG tablet Commonly known as:  NORVASC Take 10 mg by mouth daily.   Carafate 1 GM/10ML suspension Generic drug:  sucralfate Take 1 g by mouth 4 (four) times daily.   Clear Eyes All Seasons 5-6 MG/ML Soln Generic drug:  Polyvinyl  Alcohol-Povidone Place 2 drops into both eyes 3 (three) times daily as needed (drye eyes.).   cyclobenzaprine 10 MG tablet Commonly known as:  FLEXERIL Take 10 mg by mouth 3 (three) times daily.   dicyclomine 10 MG capsule Commonly known as:  BENTYL TAKE 1 CAPSULE BY MOUTH THREE TIMES A DAY What changed:  See the new instructions.   dronabinol 2.5 MG capsule Commonly known as:  MARINOL Take 1 capsule (2.5 mg total) by mouth 2 (two) times daily before a meal.   Embeda 30-1.2 MG Cpcr Generic drug:  Morphine-Naltrexone Take 3 (three) times daily by mouth.   Embeda 20-0.8 MG Cpcr Generic drug:  Morphine-Naltrexone Take 1 capsule by mouth 3 (three) times daily.   furosemide 40 MG tablet Commonly known as:  LASIX Take 40 mg by mouth daily.   gabapentin 300 MG capsule Commonly known as:  NEURONTIN Take 1 capsule (300 mg total) by mouth 3 (three) times daily.   glipiZIDE 5 MG tablet Commonly known as:  GLUCOTROL Take 5 mg by mouth 2 (two) times daily before a meal.   HYDROcodone-acetaminophen 5-325 MG tablet Commonly known as:  Norco Take 1-2 tablets by mouth every 6 (six) hours as needed. What changed:  reasons to take this   lactulose 10  GM/15ML solution Commonly known as:  CHRONULAC Take 20 g by mouth daily.   latanoprost 0.005 % ophthalmic solution Commonly known as:  XALATAN Place 1 drop into the right eye every evening.   lidocaine 5 % Commonly known as:  LIDODERM Place 1 patch onto the skin daily for 30 doses. Remove & Discard patch within 12 hours or as directed by MD   lidocaine-prilocaine cream Commonly known as:  EMLA Apply 1 application topically as needed.   linaclotide 145 MCG Caps capsule Commonly known as:  LINZESS Take 1 capsule (145 mcg total) by mouth daily before breakfast. What changed:    when to take this  reasons to take this   LORazepam 0.5 MG tablet Commonly known as:  Ativan Take 1 tablet (0.5 mg total) by mouth every 6 (six)  hours as needed (Nausea or vomiting).   losartan 25 MG tablet Commonly known as:  COZAAR Take 25 mg by mouth.   metoprolol succinate 100 MG 24 hr tablet Commonly known as:  TOPROL-XL Take 100 mg by mouth daily.   mirabegron ER 50 MG Tb24 tablet Commonly known as:  MYRBETRIQ Take 50 mg by mouth daily.   omeprazole 40 MG capsule Commonly known as:  PRILOSEC TAKE 1 CAPSULE BY MOUTH TWICE A DAY   ondansetron 8 MG tablet Commonly known as:  ZOFRAN TAKE 1 TABLET(8 MG) BY MOUTH TWICE DAILY AS NEEDED FOR NAUSEA OR VOMITING What changed:  See the new instructions.   orphenadrine 100 MG tablet Commonly known as:  NORFLEX Take 1 tablet (100 mg total) by mouth 2 (two) times daily.   oxyCODONE-acetaminophen 10-325 MG tablet Commonly known as:  PERCOCET Take 1 tablet by mouth every 6 (six) hours as needed for pain.   pioglitazone 30 MG tablet Commonly known as:  ACTOS Take 30 mg by mouth daily.   potassium chloride SA 20 MEQ tablet Commonly known as:  K-DUR Take 1 tablet (20 mEq total) by mouth 2 (two) times daily.   prochlorperazine 10 MG tablet Commonly known as:  COMPAZINE take 1 tablet by mouth every 6 hours if needed for nausea and vomiting What changed:    how much to take  how to take this  when to take this  reasons to take this   Relistor 150 MG Tabs Generic drug:  Methylnaltrexone Bromide Take 150 mg by mouth.   Sennosides 25 MG Tabs Take 25 mg by mouth daily as needed (constipation).   Vitamin D (Ergocalciferol) 1.25 MG (50000 UT) Caps capsule Commonly known as:  DRISDOL Take 50,000 Units by mouth every Saturday.   Xtampza ER 13.5 MG C12a Generic drug:  oxyCODONE ER Take 1 capsule by mouth 2 (two) times daily.   Zioptan 0.0015 % Soln Generic drug:  Tafluprost (PF) Place 1 drop into both eyes daily.       Allergies:  Allergies  Allergen Reactions  . Iodinated Diagnostic Agents Rash    Patient states he was instructed not to take IV contrast.   In 2008 he had an unknown reaction, and was told not to take it again.  He was also told not to take it due to his kidneys.  . Iodine Anxiety, Rash and Other (See Comments)    Didn't feel right "instructed not to take per MD--something with his port"     Past Medical History, Surgical history, Social history, and Family History were reviewed and updated.  Review of Systems: Review of Systems  Constitutional: Positive for malaise/fatigue.  HENT: Negative.   Eyes: Negative.   Respiratory: Negative.   Cardiovascular: Positive for leg swelling.  Gastrointestinal: Positive for nausea.  Genitourinary: Negative.   Musculoskeletal: Positive for joint pain and myalgias.  Skin: Negative.   Neurological: Positive for weakness.  Endo/Heme/Allergies: Negative.   Psychiatric/Behavioral: Negative.      Physical Exam:  vitals were not taken for this visit.   Wt Readings from Last 3 Encounters:  06/06/18 195 lb 1.3 oz (88.5 kg)  05/23/18 196 lb (88.9 kg)  04/25/18 192 lb 12 oz (87.4 kg)    Physical Exam Vitals signs reviewed.  HENT:     Head: Normocephalic and atraumatic.  Eyes:     Pupils: Pupils are equal, round, and reactive to light.  Neck:     Musculoskeletal: Normal range of motion.  Cardiovascular:     Rate and Rhythm: Normal rate and regular rhythm.     Heart sounds: Normal heart sounds.  Pulmonary:     Effort: Pulmonary effort is normal.     Breath sounds: Normal breath sounds.  Abdominal:     General: Bowel sounds are normal.     Palpations: Abdomen is soft.  Musculoskeletal: Normal range of motion.        General: No tenderness or deformity.     Right lower leg: Edema present.     Left lower leg: Edema present.  Lymphadenopathy:     Cervical: No cervical adenopathy.  Skin:    General: Skin is warm and dry.     Findings: No erythema or rash.  Neurological:     Mental Status: He is alert and oriented to person, place, and time.  Psychiatric:        Behavior:  Behavior normal.        Thought Content: Thought content normal.        Judgment: Judgment normal.      Lab Results  Component Value Date   WBC 8.9 07/04/2018   HGB 10.6 (L) 07/04/2018   HCT 32.9 (L) 07/04/2018   MCV 82.7 07/04/2018   PLT 153 07/04/2018   Lab Results  Component Value Date   FERRITIN 2,765 (H) 06/20/2018   IRON 70 06/20/2018   TIBC 253 06/20/2018   UIBC 182 06/20/2018   IRONPCTSAT 28 06/20/2018   Lab Results  Component Value Date   RETICCTPCT 0.7 03/28/2018   RBC 3.98 (L) 07/04/2018   RETICCTABS 27.5 03/21/2014   Lab Results  Component Value Date   KPAFRELGTCHN 21.9 (H) 06/20/2018   LAMBDASER 13.3 06/20/2018   KAPLAMBRATIO 1.65 06/20/2018   Lab Results  Component Value Date   IGGSERUM 754 06/20/2018   IGA 143 06/20/2018   IGMSERUM 19 (L) 06/20/2018   Lab Results  Component Value Date   TOTALPROTELP 6.6 06/20/2018   ALBUMINELP 3.7 06/20/2018   A1GS 0.3 06/20/2018   A2GS 1.0 06/20/2018   BETS 0.9 06/20/2018   BETA2SER 0.5 11/07/2014   GAMS 0.7 06/20/2018   MSPIKE Not Observed 06/20/2018   SPEI Comment 03/28/2018     Chemistry      Component Value Date/Time   NA 142 06/27/2018 0815   NA 143 02/22/2017 0744   K 3.7 06/27/2018 0815   K 4.0 02/22/2017 0744   CL 109 06/27/2018 0815   CL 106 02/22/2017 0744   CO2 25 06/27/2018 0815   CO2 27 02/22/2017 0744   BUN 20 06/27/2018 0815   BUN 26 (H) 02/22/2017 0744   CREATININE 2.32 (H) 06/27/2018 0815  CREATININE 2.3 (H) 02/22/2017 0744      Component Value Date/Time   CALCIUM 8.7 (L) 06/27/2018 0815   CALCIUM 9.3 02/22/2017 0744   ALKPHOS 47 06/27/2018 0815   ALKPHOS 80 02/22/2017 0744   AST 27 06/27/2018 0815   ALT 12 06/27/2018 0815   ALT 22 02/22/2017 0744   BILITOT 0.4 06/27/2018 0815       Impression and Plan: [name] is a very pleasant 68 yo African American gentleman with IgG kappa myeloma. His myeloma studies have remained stable.   Again, I had believe that his  problems are clearly other than myeloma.  He has diabetes.  He has fibromyalgia.  Again, he just does not see other doctors.  We will have to see what his echocardiogram shows.  A ultrasound of his liver to see how this looks.  He is on Lasix.  I told him to double up the Lasix to take 80 mg daily.  Really needs to see his family doctor.  Again I do not know if he does see him I will see him.  I am going to hold off on any treatment on.  Go back in 3 weeks and we will see how he is doing.    Volanda Napoleon, MD 5/11/20208:55 AM

## 2018-07-04 NOTE — ED Notes (Signed)
Pt verbalizes understanding of d/c instructions and denies any further needs at this time. 

## 2018-07-04 NOTE — ED Notes (Signed)
US at bedside

## 2018-07-04 NOTE — ED Triage Notes (Signed)
Pt c/o "swellling all over for a couple of months"-weakness to left side "like it goes dead" x "couple months"-NAD-to triage in w/c

## 2018-07-08 ENCOUNTER — Ambulatory Visit (HOSPITAL_BASED_OUTPATIENT_CLINIC_OR_DEPARTMENT_OTHER)
Admission: RE | Admit: 2018-07-08 | Discharge: 2018-07-08 | Disposition: A | Discharge status: Home / Self Care | Payer: Medicare Other | Source: Ambulatory Visit | Attending: Hematology & Oncology | Admitting: Hematology & Oncology

## 2018-07-08 ENCOUNTER — Other Ambulatory Visit: Payer: Self-pay

## 2018-07-08 DIAGNOSIS — C9000 Multiple myeloma not having achieved remission: Secondary | ICD-10-CM | POA: Diagnosis not present

## 2018-07-11 ENCOUNTER — Telehealth: Payer: Self-pay | Admitting: *Deleted

## 2018-07-11 NOTE — Telephone Encounter (Signed)
-----   Message from Volanda Napoleon, MD sent at 07/08/2018  7:12 PM EDT ----- Call - there might be a littlle cirrhosis.  This is from diabetes.  Laurey Arrow

## 2018-07-11 NOTE — Telephone Encounter (Signed)
Notified pt of results, pt verbalized understanding.  

## 2018-07-12 ENCOUNTER — Ambulatory Visit (HOSPITAL_BASED_OUTPATIENT_CLINIC_OR_DEPARTMENT_OTHER)
Admission: RE | Admit: 2018-07-12 | Discharge: 2018-07-12 | Disposition: A | Discharge status: Home / Self Care | Payer: Medicare Other | Source: Ambulatory Visit | Attending: Hematology & Oncology | Admitting: Hematology & Oncology

## 2018-07-12 ENCOUNTER — Telehealth: Payer: Self-pay | Admitting: *Deleted

## 2018-07-12 ENCOUNTER — Other Ambulatory Visit: Payer: Self-pay

## 2018-07-12 ENCOUNTER — Other Ambulatory Visit: Payer: Self-pay | Admitting: Family

## 2018-07-12 DIAGNOSIS — E785 Hyperlipidemia, unspecified: Secondary | ICD-10-CM | POA: Insufficient documentation

## 2018-07-12 DIAGNOSIS — I10 Essential (primary) hypertension: Secondary | ICD-10-CM | POA: Insufficient documentation

## 2018-07-12 DIAGNOSIS — Z79899 Other long term (current) drug therapy: Secondary | ICD-10-CM

## 2018-07-12 DIAGNOSIS — C9000 Multiple myeloma not having achieved remission: Secondary | ICD-10-CM | POA: Insufficient documentation

## 2018-07-12 DIAGNOSIS — E119 Type 2 diabetes mellitus without complications: Secondary | ICD-10-CM | POA: Diagnosis not present

## 2018-07-12 DIAGNOSIS — G629 Polyneuropathy, unspecified: Secondary | ICD-10-CM

## 2018-07-12 NOTE — Telephone Encounter (Signed)
-----   Message from Volanda Napoleon, MD sent at 07/12/2018  2:06 PM EDT ----- Call - the echo looks ok.  No heart failure.  pete

## 2018-07-12 NOTE — Telephone Encounter (Signed)
Patient notified per order of Dr. [name] that the echo looks ok and there is no heart failure.  Patient appreciative of call and has no questions or concerns at this time.

## 2018-07-19 ENCOUNTER — Ambulatory Visit: Payer: Medicare Other

## 2018-07-19 ENCOUNTER — Ambulatory Visit: Payer: Medicare Other | Admitting: Hematology & Oncology

## 2018-07-19 ENCOUNTER — Other Ambulatory Visit: Payer: Medicare Other

## 2018-07-25 ENCOUNTER — Inpatient Hospital Stay: Payer: Medicare Other

## 2018-07-25 ENCOUNTER — Other Ambulatory Visit: Payer: Self-pay

## 2018-07-25 ENCOUNTER — Encounter: Payer: Self-pay | Admitting: Hematology & Oncology

## 2018-07-25 ENCOUNTER — Inpatient Hospital Stay (HOSPITAL_BASED_OUTPATIENT_CLINIC_OR_DEPARTMENT_OTHER): Payer: Medicare Other | Admitting: Hematology & Oncology

## 2018-07-25 ENCOUNTER — Inpatient Hospital Stay: Payer: Medicare Other | Attending: Hematology & Oncology

## 2018-07-25 VITALS — Wt 195.0 lb

## 2018-07-25 DIAGNOSIS — D631 Anemia in chronic kidney disease: Secondary | ICD-10-CM

## 2018-07-25 DIAGNOSIS — Z299 Encounter for prophylactic measures, unspecified: Secondary | ICD-10-CM

## 2018-07-25 DIAGNOSIS — C9001 Multiple myeloma in remission: Secondary | ICD-10-CM

## 2018-07-25 DIAGNOSIS — Z5112 Encounter for antineoplastic immunotherapy: Secondary | ICD-10-CM | POA: Insufficient documentation

## 2018-07-25 DIAGNOSIS — E291 Testicular hypofunction: Secondary | ICD-10-CM | POA: Diagnosis not present

## 2018-07-25 DIAGNOSIS — N189 Chronic kidney disease, unspecified: Secondary | ICD-10-CM | POA: Insufficient documentation

## 2018-07-25 DIAGNOSIS — E349 Endocrine disorder, unspecified: Secondary | ICD-10-CM

## 2018-07-25 DIAGNOSIS — Z5111 Encounter for antineoplastic chemotherapy: Secondary | ICD-10-CM | POA: Diagnosis not present

## 2018-07-25 DIAGNOSIS — M797 Fibromyalgia: Secondary | ICD-10-CM | POA: Insufficient documentation

## 2018-07-25 DIAGNOSIS — D509 Iron deficiency anemia, unspecified: Secondary | ICD-10-CM | POA: Diagnosis not present

## 2018-07-25 DIAGNOSIS — Z862 Personal history of diseases of the blood and blood-forming organs and certain disorders involving the immune mechanism: Secondary | ICD-10-CM | POA: Diagnosis not present

## 2018-07-25 DIAGNOSIS — M4712 Other spondylosis with myelopathy, cervical region: Secondary | ICD-10-CM

## 2018-07-25 DIAGNOSIS — C9000 Multiple myeloma not having achieved remission: Secondary | ICD-10-CM | POA: Insufficient documentation

## 2018-07-25 DIAGNOSIS — G629 Polyneuropathy, unspecified: Secondary | ICD-10-CM

## 2018-07-25 DIAGNOSIS — D508 Other iron deficiency anemias: Secondary | ICD-10-CM

## 2018-07-25 LAB — CMP (CANCER CENTER ONLY)
ALT: 31 U/L (ref 0–44)
AST: 29 U/L (ref 15–41)
Albumin: 4.3 g/dL (ref 3.5–5.0)
Alkaline Phosphatase: 61 U/L (ref 38–126)
Anion gap: 8 (ref 5–15)
BUN: 25 mg/dL — ABNORMAL HIGH (ref 8–23)
CO2: 27 mmol/L (ref 22–32)
Calcium: 8.9 mg/dL (ref 8.9–10.3)
Chloride: 107 mmol/L (ref 98–111)
Creatinine: 2.2 mg/dL — ABNORMAL HIGH (ref 0.61–1.24)
GFR, Est AFR Am: 34 mL/min — ABNORMAL LOW (ref >60)
GFR, Estimated: 29 mL/min — ABNORMAL LOW (ref >60)
Glucose, Bld: 89 mg/dL (ref 70–99)
Potassium: 3.6 mmol/L (ref 3.5–5.1)
Sodium: 142 mmol/L (ref 135–145)
Total Bilirubin: 0.3 mg/dL (ref 0.3–1.2)
Total Protein: 7 g/dL (ref 6.5–8.1)

## 2018-07-25 LAB — CBC WITH DIFFERENTIAL (CANCER CENTER ONLY)
Abs Immature Granulocytes: 0.02 10*3/uL (ref 0.00–0.07)
Basophils Absolute: 0 10*3/uL (ref 0.0–0.1)
Basophils Relative: 0 %
Eosinophils Absolute: 0.2 10*3/uL (ref 0.0–0.5)
Eosinophils Relative: 4 %
HCT: 31.1 % — ABNORMAL LOW (ref 39.0–52.0)
Hemoglobin: 9.6 g/dL — ABNORMAL LOW (ref 13.0–17.0)
Immature Granulocytes: 0 %
Lymphocytes Relative: 14 %
Lymphs Abs: 0.7 10*3/uL (ref 0.7–4.0)
MCH: 26.3 pg (ref 26.0–34.0)
MCHC: 30.9 g/dL (ref 30.0–36.0)
MCV: 85.2 fL (ref 80.0–100.0)
Monocytes Absolute: 0.6 10*3/uL (ref 0.1–1.0)
Monocytes Relative: 11 %
Neutro Abs: 3.6 10*3/uL (ref 1.7–7.7)
Neutrophils Relative %: 71 %
Platelet Count: 142 10*3/uL — ABNORMAL LOW (ref 150–400)
RBC: 3.65 MIL/uL — ABNORMAL LOW (ref 4.22–5.81)
RDW: 17.9 % — ABNORMAL HIGH (ref 11.5–15.5)
WBC Count: 5.2 10*3/uL (ref 4.0–10.5)
nRBC: 0 % (ref 0.0–0.2)

## 2018-07-25 LAB — IRON AND TIBC
Iron: 171 ug/dL — ABNORMAL HIGH (ref 42–163)
Saturation Ratios: 71 % — ABNORMAL HIGH (ref 20–55)
TIBC: 240 ug/dL (ref 202–409)
UIBC: 69 ug/dL — ABNORMAL LOW (ref 117–376)

## 2018-07-25 LAB — FERRITIN: Ferritin: 2245 ng/mL — ABNORMAL HIGH (ref 24–336)

## 2018-07-25 LAB — RETICULOCYTES
Immature Retic Fract: 11.2 % (ref 2.3–15.9)
RBC.: 3.66 MIL/uL — ABNORMAL LOW (ref 4.22–5.81)
Retic Count, Absolute: 22.7 10*3/uL (ref 19.0–186.0)
Retic Ct Pct: 0.6 % (ref 0.4–3.1)

## 2018-07-25 LAB — LACTATE DEHYDROGENASE: LDH: 249 U/L — ABNORMAL HIGH (ref 98–192)

## 2018-07-25 MED ORDER — SODIUM CHLORIDE 0.9 % IV SOLN
Freq: Once | INTRAVENOUS | Status: AC
  Administered 2018-07-25: 09:00:00 via INTRAVENOUS
  Filled 2018-07-25: qty 250

## 2018-07-25 MED ORDER — TESTOSTERONE CYPIONATE 200 MG/ML IM SOLN
INTRAMUSCULAR | Status: AC
  Filled 2018-07-25: qty 1

## 2018-07-25 MED ORDER — TESTOSTERONE CYPIONATE 200 MG/ML IM SOLN
400.0000 mg | Freq: Once | INTRAMUSCULAR | Status: AC
  Administered 2018-07-25: 400 mg via INTRAMUSCULAR

## 2018-07-25 MED ORDER — SODIUM CHLORIDE 0.9% FLUSH
10.0000 mL | INTRAVENOUS | Status: DC | PRN
  Administered 2018-07-25: 16:00:00 10 mL
  Filled 2018-07-25: qty 10

## 2018-07-25 MED ORDER — PALONOSETRON HCL INJECTION 0.25 MG/5ML
INTRAVENOUS | Status: AC
  Filled 2018-07-25: qty 5

## 2018-07-25 MED ORDER — ALTEPLASE 2 MG IJ SOLR
2.0000 mg | Freq: Once | INTRAMUSCULAR | Status: DC | PRN
  Filled 2018-07-25: qty 2

## 2018-07-25 MED ORDER — HEPARIN SOD (PORK) LOCK FLUSH 100 UNIT/ML IV SOLN
250.0000 [IU] | Freq: Once | INTRAVENOUS | Status: DC | PRN
  Filled 2018-07-25: qty 5

## 2018-07-25 MED ORDER — DEXAMETHASONE SODIUM PHOSPHATE 10 MG/ML IJ SOLN
10.0000 mg | Freq: Once | INTRAMUSCULAR | Status: AC
  Administered 2018-07-25: 10 mg via INTRAVENOUS

## 2018-07-25 MED ORDER — HYDROMORPHONE HCL 4 MG/ML IJ SOLN
INTRAMUSCULAR | Status: AC
  Filled 2018-07-25: qty 1

## 2018-07-25 MED ORDER — SODIUM CHLORIDE 0.9% FLUSH
3.0000 mL | Freq: Once | INTRAVENOUS | Status: DC | PRN
  Filled 2018-07-25: qty 10

## 2018-07-25 MED ORDER — DARBEPOETIN ALFA 300 MCG/0.6ML IJ SOSY
PREFILLED_SYRINGE | INTRAMUSCULAR | Status: AC
  Filled 2018-07-25: qty 0.6

## 2018-07-25 MED ORDER — DEXAMETHASONE SODIUM PHOSPHATE 10 MG/ML IJ SOLN
INTRAMUSCULAR | Status: AC
  Filled 2018-07-25: qty 1

## 2018-07-25 MED ORDER — PALONOSETRON HCL INJECTION 0.25 MG/5ML
0.2500 mg | Freq: Once | INTRAVENOUS | Status: AC
  Administered 2018-07-25: 0.25 mg via INTRAVENOUS

## 2018-07-25 MED ORDER — SODIUM CHLORIDE 0.9 % IV SOLN
Freq: Once | INTRAVENOUS | Status: DC
  Filled 2018-07-25: qty 250

## 2018-07-25 MED ORDER — HYDROMORPHONE HCL 1 MG/ML IJ SOLN
2.0000 mg | Freq: Once | INTRAMUSCULAR | Status: AC
  Administered 2018-07-25: 2 mg via INTRAVENOUS

## 2018-07-25 MED ORDER — DARBEPOETIN ALFA 300 MCG/0.6ML IJ SOSY
300.0000 ug | PREFILLED_SYRINGE | Freq: Once | INTRAMUSCULAR | Status: AC
  Administered 2018-07-25: 300 ug via SUBCUTANEOUS

## 2018-07-25 MED ORDER — DEXTROSE 5 % IV SOLN
29.0000 mg/m2 | Freq: Once | INTRAVENOUS | Status: AC
  Administered 2018-07-25: 60 mg via INTRAVENOUS
  Filled 2018-07-25: qty 30

## 2018-07-25 MED ORDER — HEPARIN SOD (PORK) LOCK FLUSH 100 UNIT/ML IV SOLN
500.0000 [IU] | Freq: Once | INTRAVENOUS | Status: AC | PRN
  Administered 2018-07-25: 500 [IU]
  Filled 2018-07-25: qty 5

## 2018-07-25 MED ORDER — SODIUM CHLORIDE 0.9 % IV SOLN
300.0000 mg/m2 | Freq: Once | INTRAVENOUS | Status: AC
  Administered 2018-07-25: 620 mg via INTRAVENOUS
  Filled 2018-07-25: qty 31

## 2018-07-25 MED ORDER — SODIUM CHLORIDE 0.9 % IV SOLN
60.0000 mg | Freq: Once | INTRAVENOUS | Status: AC
  Administered 2018-07-25: 60 mg via INTRAVENOUS
  Filled 2018-07-25: qty 20

## 2018-07-25 NOTE — Patient Instructions (Signed)
Implanted Port Insertion Implanted port insertion is a procedure to put in a port and catheter. The port is a device with an injectable disk that can be accessed by your health care provider. The port is connected to a vein in the chest or neck by a small flexible tube (catheter). There are different types of ports. The implanted port may be used as a long-term IV access for:  Medicines, such as chemotherapy.  Fluids.  Liquid nutrition, such as total parenteral nutrition (TPN). When you have a port, this means that your health care provider will not need to use the veins in your arms for these procedures. Tell a health care provider about:  Any allergies you have.  All medicines you are taking, especially blood thinners, as well as any vitamins, herbs, eye drops, creams, over-the-counter medicines, and steroids.  Any problems you or family members have had with anesthetic medicines.  Any blood disorders you have.  Any surgeries you have had.  Any medical conditions you have or have had, including diabetes or kidney problems.  Whether you are pregnant or may be pregnant. What are the risks? Generally, this is a safe procedure. However, problems may occur, including:  Allergic reactions to medicines or dyes.  Damage to other structures or organs.  Infection.  Damage to the blood vessel, bruising, or bleeding at the puncture site.  Blood clot.  Breakdown of the skin over the port.  A collection of air in the chest that can cause one of the lungs to collapse (pneumothorax). This is rare. What happens before the procedure? Medicines  Ask your health care provider about: ? Changing or stopping your regular medicines. This is especially important if you are taking diabetes medicines or blood thinners. ? Taking medicines such as aspirin and ibuprofen. These medicines can thin your blood. Do not take these medicines unless your health care provider tells you to take them. ?  Taking over-the-counter medicines, vitamins, herbs, and supplements. Staying hydrated Follow instructions from your health care provider about hydration, which may include:  Up to 2 hours before the procedure - you may continue to drink clear liquids, such as water, clear fruit juice, black coffee, and plain tea.  Eating and drinking restrictions  Follow instructions from your health care provider about eating and drinking, which may include: ? 8 hours before the procedure - stop eating heavy meals or foods, such as meat, fried foods, or fatty foods. ? 6 hours before the procedure - stop eating light meals or foods, such as toast or cereal. ? 6 hours before the procedure - stop drinking milk or drinks that contain milk. ? 2 hours before the procedure - stop drinking clear liquids. General instructions  Plan to have someone take you home from the hospital or clinic.  If you will be going home right after the procedure, plan to have someone with you for 24 hours.  You may have blood tests.  Do not use any products that contain nicotine or tobacco for at least 4-6 weeks before the procedure. These products include cigarettes, e-cigarettes, and chewing tobacco. If you need help quitting, ask your health care provider.  Ask your health care provider what steps will be taken to help prevent infection. These may include: ? Removing hair at the surgery site. ? Washing skin with a germ-killing soap. ? Taking antibiotic medicine. What happens during the procedure?   An IV will be inserted into one of your veins.  You will be given   one or more of the following: ? A medicine to help you relax (sedative). ? A medicine to numb the area (local anesthetic).  Two small incisions will be made to insert the port. ? One smaller incision will be made in your neck to get access to the vein where the catheter will lie. ? The other incision will be made in the upper chest. This is where the port will  lie.  The procedure may be done using continuous X-ray (fluoroscopy) or other imaging tools for guidance.  The port and catheter will be placed. There may be a small, raised area where the port is.  The port will be flushed with a salt solution (saline), and blood will be drawn to make sure that it is working correctly.  The incisions will be closed.  Bandages (dressings) may be placed over the incisions. The procedure may vary among health care providers and hospitals. What happens after the procedure?  Your blood pressure, heart rate, breathing rate, and blood oxygen level will be monitored until you leave the hospital or clinic.  Do not drive for 24 hours if you were given a sedative during your procedure.  You will be given a manufacturer's information card for the type of port that you have. Keep this with you.  Your port will need to be flushed and checked as told by your health care provider, usually every few weeks.  A chest X-ray will be done to: ? Check the placement of the port. ? Make sure there is no injury to your lung. Summary  Implanted port insertion is a procedure to put in a port and catheter.  The implanted port is used as a long-term IV access.  The port will need to be flushed and checked as told by your health care provider, usually every few weeks.  Keep your manufacturer's information card with you at all times. This information is not intended to replace advice given to you by your health care provider. Make sure you discuss any questions you have with your health care provider. Document Released: 11/30/2012 Document Revised: 09/07/2017 Document Reviewed: 09/07/2017 Elsevier Interactive Patient Education  2019 [employer]  

## 2018-07-25 NOTE — Patient Instructions (Signed)
[facility] Discharge Instructions for Patients Receiving Chemotherapy  Today you received the following chemotherapy agents Aranesp, Cytoxan, Kryprolis, Aredia To help prevent nausea and vomiting after your treatment, we encourage you to take your nausea medication as directed.If you develop nausea and vomiting that is not controlled by your nausea medication, call the clinic.   BELOW ARE SYMPTOMS THAT SHOULD BE REPORTED IMMEDIATELY:  *FEVER GREATER THAN 100.5 F  *CHILLS WITH OR WITHOUT FEVER  NAUSEA AND VOMITING THAT IS NOT CONTROLLED WITH YOUR NAUSEA MEDICATION  *UNUSUAL SHORTNESS OF BREATH  *UNUSUAL BRUISING OR BLEEDING  TENDERNESS IN MOUTH AND THROAT WITH OR WITHOUT PRESENCE OF ULCERS  *URINARY PROBLEMS  *BOWEL PROBLEMS  UNUSUAL RASH Items with * indicate a potential emergency and should be followed up as soon as possible.  Feel free to call the clinic should you have any questions or concerns. The clinic phone number is [phone].  Please show the [facility] at check-in to the Emergency Department and triage nurse.

## 2018-07-25 NOTE — Progress Notes (Signed)
CBC and CMEt reviewed with MD, ok to treat. Pt will get Aredia, Aranesp, Testosterone and Treatment today.

## 2018-07-25 NOTE — Progress Notes (Signed)
Hematology and Oncology Follow Up Visit  [name] [id number] Nov 02, 1950 68 y.o. 07/25/2018   Principle Diagnosis:  IgG kappa myeloma Anemia secondary to renal insufficiency Intermittent iron - deficiency anemia Hypotestosteronemia  Current Therapy:   Aredia 60 mg IV q 3 months-- next dose 09/2018 Aranesp 300 mcg subcu as needed for hemoglobin less than 11 DepoTestosterone400 mg q 4 weeks IV iron as indicated Kyprolis/Cytoxan - s/p cycle18   Interim History:  [name] is here today for follow-up and treatment.  He is feeling a lot better.  Still not sure exactly what happened to him the last time that we saw him.  He said that he had gotten some steroid injections into his back.  He thought this may have been the problem.  He had Dopplers done of his legs.  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  His last myeloma studies done a month ago did not show a monoclonal spike in his blood.  His IgG level was 154 mg/dL.  His kappa light chain was 2.2 mg/dL.  His appetite is good.  He has had no nausea or vomiting.  We did do an echocardiogram on him.  His LVEF was 60 to 65%.  He had moderate left ventricular hypertrophy.  There is no valvular issues.    As always blood sugars have been on the higher side.  He does have the fibromyalgia.  He also has a some rheumatoid arthritis.  Overall, his performance status is ECOG 0.     Medications:  Allergies as of 07/25/2018      Reactions   Iodinated Diagnostic Agents Rash   Patient states he was instructed not to take IV contrast.  In 2008 he had an unknown reaction, and was told not to take it again.  He was also told not to take it due to his kidneys.   Iodine Anxiety, Rash, Other (See Comments)   Didn't feel right "instructed not to take per MD--something with his port"      Medication List       Accurate as of July 25, 2018  9:03 AM. If you have any questions, ask your nurse  or doctor.        amLODipine 10 MG tablet Commonly known as:  NORVASC Take 10 mg by mouth daily.   Carafate 1 GM/10ML suspension Generic drug:  sucralfate Take 1 g by mouth 4 (four) times daily. What changed:  Another medication with the same name was removed. Continue taking this medication, and follow the directions you see here. Changed by:  Volanda Napoleon, MD   Clear Eyes All Seasons 5-6 MG/ML Soln Generic drug:  Polyvinyl Alcohol-Povidone Place 2 drops into both eyes 3 (three) times daily as needed (drye eyes.).   cyclobenzaprine 10 MG tablet Commonly known as:  FLEXERIL Take 10 mg by mouth 3 (three) times daily.   dicyclomine 10 MG capsule Commonly known as:  BENTYL TAKE 1 CAPSULE BY MOUTH THREE TIMES A DAY What changed:  See the new instructions.   dronabinol 2.5 MG capsule Commonly known as:  MARINOL Take 1 capsule (2.5 mg total) by mouth 2 (two) times daily before a meal.   Embeda 30-1.2 MG Cpcr Generic drug:  Morphine-Naltrexone Take 3 (three) times daily by mouth.   Embeda 20-0.8 MG Cpcr Generic drug:  Morphine-Naltrexone Take 1 capsule by mouth 3 (three) times daily.   furosemide 40 MG tablet Commonly known as:  LASIX Take 40 mg by mouth daily.   gabapentin 300 MG capsule Commonly known as:  NEURONTIN TAKE 1 CAPSULE BY MOUTH THREE TIMES A DAY What changed:  Another medication with the same name was removed. Continue taking this medication, and follow the directions you see here. Changed by:  Volanda Napoleon, MD   glipiZIDE 5 MG tablet Commonly known as:  GLUCOTROL Take 5 mg by mouth 2 (two) times daily before a meal.   HYDROcodone-acetaminophen 5-325 MG tablet Commonly known as:  Norco Take 1-2 tablets by mouth every 6 (six) hours as needed. What changed:  reasons to take this   lactulose 10 GM/15ML solution Commonly known as:  CHRONULAC Take 20 g by mouth daily.   latanoprost 0.005 % ophthalmic solution Commonly known as:  XALATAN Place 1  drop into the right eye every evening.   lidocaine 5 % Commonly known as:  LIDODERM Place 1 patch onto the skin daily for 30 doses. Remove & Discard patch within 12 hours or as directed by MD   lidocaine-prilocaine cream Commonly known as:  EMLA Apply 1 application topically as needed.   linaclotide 145 MCG Caps capsule Commonly known as:  LINZESS Take 1 capsule (145 mcg total) by mouth daily before breakfast. What changed:    when to take this  reasons to take this   LORazepam 0.5 MG tablet Commonly known as:  Ativan Take 1 tablet (0.5 mg total) by mouth every 6 (six) hours as needed (Nausea or vomiting).   losartan 25 MG tablet Commonly known as:  COZAAR Take 25 mg by mouth.   mesalamine 1.2 g EC tablet Commonly known as:  Lialda Take 1 tablet (1.2 g total) by mouth daily with breakfast.   metoprolol succinate 100 MG 24 hr tablet Commonly known as:  TOPROL-XL Take 100 mg by mouth daily.   mirabegron ER 50 MG Tb24 tablet Commonly known as:  MYRBETRIQ Take 50 mg by mouth daily.   morphine 30 MG 12 hr tablet Commonly known as:  MS CONTIN Take 30 mg by mouth 2 (two) times daily.   omeprazole 40 MG capsule Commonly known as:  PRILOSEC TAKE 1 CAPSULE BY MOUTH TWICE A DAY   ondansetron 8 MG tablet Commonly known as:  ZOFRAN TAKE 1 TABLET(8 MG) BY MOUTH TWICE DAILY AS NEEDED FOR NAUSEA OR VOMITING What changed:  See the new instructions.   orphenadrine 100 MG tablet Commonly known as:  NORFLEX Take 1 tablet (100 mg total) by mouth 2 (two) times daily.   oxyCODONE-acetaminophen 10-325 MG tablet Commonly known as:  PERCOCET Take 1 tablet by mouth every 6 (six) hours as needed for pain.   pioglitazone 30 MG tablet Commonly known as:  ACTOS Take 30 mg by mouth daily.   potassium chloride SA 20 MEQ tablet Commonly known as:  K-DUR Take 1 tablet (20 mEq total) by mouth 2 (two) times daily.   prochlorperazine 10 MG tablet Commonly known as:  COMPAZINE take 1  tablet by mouth every 6 hours if needed for nausea and vomiting What changed:    how much to take  how to take this  when to take this  reasons to take this   Relistor 150 MG Tabs Generic drug:  Methylnaltrexone Bromide Take 150 mg by mouth.   Sennosides 25 MG Tabs Take 25 mg by mouth daily as needed (constipation).   Vitamin D (Ergocalciferol) 1.25 MG (50000 UT) Caps capsule Commonly known as:  DRISDOL Take 50,000 Units  by mouth every Saturday.   Xtampza ER 13.5 MG C12a Generic drug:  oxyCODONE ER Take 1 capsule by mouth 2 (two) times daily.   Zioptan 0.0015 % Soln Generic drug:  Tafluprost (PF) Place 1 drop into both eyes daily.       Allergies:  Allergies  Allergen Reactions  . Iodinated Diagnostic Agents Rash    Patient states he was instructed not to take IV contrast.  In 2008 he had an unknown reaction, and was told not to take it again.  He was also told not to take it due to his kidneys.  . Iodine Anxiety, Rash and Other (See Comments)    Didn't feel right "instructed not to take per MD--something with his port"     Past Medical History, Surgical history, Social history, and Family History were reviewed and updated.  Review of Systems: Review of Systems  Constitutional: Positive for malaise/fatigue.  HENT: Negative.   Eyes: Negative.   Respiratory: Negative.   Cardiovascular: Positive for leg swelling.  Gastrointestinal: Positive for nausea.  Genitourinary: Negative.   Musculoskeletal: Positive for joint pain and myalgias.  Skin: Negative.   Neurological: Positive for weakness.  Endo/Heme/Allergies: Negative.   Psychiatric/Behavioral: Negative.      Physical Exam:  weight is 195 lb (88.5 kg).   Wt Readings from Last 3 Encounters:  07/25/18 195 lb (88.5 kg)  07/04/18 197 lb (89.4 kg)  06/06/18 195 lb 1.3 oz (88.5 kg)    Physical Exam Vitals signs reviewed.  HENT:     Head: Normocephalic and atraumatic.  Eyes:     Pupils: Pupils are  equal, round, and reactive to light.  Neck:     Musculoskeletal: Normal range of motion.  Cardiovascular:     Rate and Rhythm: Normal rate and regular rhythm.     Heart sounds: Normal heart sounds.  Pulmonary:     Effort: Pulmonary effort is normal.     Breath sounds: Normal breath sounds.  Abdominal:     General: Bowel sounds are normal.     Palpations: Abdomen is soft.  Musculoskeletal: Normal range of motion.        General: No tenderness or deformity.     Right lower leg: Edema present.     Left lower leg: Edema present.  Lymphadenopathy:     Cervical: No cervical adenopathy.  Skin:    General: Skin is warm and dry.     Findings: No erythema or rash.  Neurological:     Mental Status: He is alert and oriented to person, place, and time.  Psychiatric:        Behavior: Behavior normal.        Thought Content: Thought content normal.        Judgment: Judgment normal.      Lab Results  Component Value Date   WBC 5.2 07/25/2018   HGB 9.6 (L) 07/25/2018   HCT 31.1 (L) 07/25/2018   MCV 85.2 07/25/2018   PLT 142 (L) 07/25/2018   Lab Results  Component Value Date   FERRITIN 2,765 (H) 06/20/2018   IRON 70 06/20/2018   TIBC 253 06/20/2018   UIBC 182 06/20/2018   IRONPCTSAT 28 06/20/2018   Lab Results  Component Value Date   RETICCTPCT 0.6 07/25/2018   RBC 3.66 (L) 07/25/2018   RETICCTABS 27.5 03/21/2014   Lab Results  Component Value Date   KPAFRELGTCHN 21.9 (H) 06/20/2018   LAMBDASER 13.3 06/20/2018   KAPLAMBRATIO 1.65 06/20/2018   Lab Results  Component Value Date   IGGSERUM 754 06/20/2018   IGA 143 06/20/2018   IGMSERUM 19 (L) 06/20/2018   Lab Results  Component Value Date   TOTALPROTELP 6.6 06/20/2018   ALBUMINELP 3.7 06/20/2018   A1GS 0.3 06/20/2018   A2GS 1.0 06/20/2018   BETS 0.9 06/20/2018   BETA2SER 0.5 11/07/2014   GAMS 0.7 06/20/2018   MSPIKE Not Observed 06/20/2018   SPEI Comment 03/28/2018     Chemistry      Component Value Date/Time    NA 140 07/04/2018 0829   NA 143 02/22/2017 0744   K 3.7 07/04/2018 0829   K 4.0 02/22/2017 0744   CL 103 07/04/2018 0829   CL 106 02/22/2017 0744   CO2 28 07/04/2018 0829   CO2 27 02/22/2017 0744   BUN 25 (H) 07/04/2018 0829   BUN 26 (H) 02/22/2017 0744   CREATININE 2.15 (H) 07/04/2018 0829   CREATININE 2.3 (H) 02/22/2017 0744      Component Value Date/Time   CALCIUM 9.2 07/04/2018 0829   CALCIUM 9.3 02/22/2017 0744   ALKPHOS 51 07/04/2018 0829   ALKPHOS 80 02/22/2017 0744   AST 14 (L) 07/04/2018 0829   ALT 9 07/04/2018 0829   ALT 22 02/22/2017 0744   BILITOT 0.5 07/04/2018 0829       Impression and Plan: [name] is a very pleasant 68 yo African American gentleman with IgG kappa myeloma. His myeloma studies have remained stable.   Since he is doing better, we will restart his treatments.  I will plan to get him back in another month.  Given that he is done so well with the chemotherapy that he has had a very nice response, we might make an adjustment with his protocol.      Volanda Napoleon, MD 6/1/20209:03 AM

## 2018-07-26 LAB — IGG, IGA, IGM
IgA: 142 mg/dL (ref 61–437)
IgG (Immunoglobin G), Serum: 893 mg/dL (ref 603–1613)
IgM (Immunoglobulin M), Srm: 22 mg/dL (ref 20–172)

## 2018-07-26 LAB — KAPPA/LAMBDA LIGHT CHAINS
Kappa free light chain: 35.6 mg/L — ABNORMAL HIGH (ref 3.3–19.4)
Kappa, lambda light chain ratio: 1.57 (ref 0.26–1.65)
Lambda free light chains: 22.7 mg/L (ref 5.7–26.3)

## 2018-07-26 LAB — TESTOSTERONE: Testosterone: 201 ng/dL — ABNORMAL LOW (ref 264–916)

## 2018-07-26 LAB — TSH: TSH: 1.622 u[IU]/mL (ref 0.320–4.118)

## 2018-07-28 LAB — PROTEIN ELECTROPHORESIS, SERUM, WITH REFLEX
A/G Ratio: 1.3 (ref 0.7–1.7)
Albumin ELP: 3.7 g/dL (ref 2.9–4.4)
Alpha-1-Globulin: 0.2 g/dL (ref 0.0–0.4)
Alpha-2-Globulin: 1 g/dL (ref 0.4–1.0)
Beta Globulin: 0.8 g/dL (ref 0.7–1.3)
Gamma Globulin: 0.7 g/dL (ref 0.4–1.8)
Globulin, Total: 2.8 g/dL (ref 2.2–3.9)
M-Spike, %: 0.2 g/dL — ABNORMAL HIGH
SPEP Interpretation: 0
Total Protein ELP: 6.5 g/dL (ref 6.0–8.5)

## 2018-07-28 LAB — IMMUNOFIXATION REFLEX, SERUM
IgA: 147 mg/dL (ref 61–437)
IgG (Immunoglobin G), Serum: 847 mg/dL (ref 603–1613)
IgM (Immunoglobulin M), Srm: 25 mg/dL (ref 20–172)

## 2018-08-15 ENCOUNTER — Inpatient Hospital Stay: Payer: Medicare Other

## 2018-08-15 ENCOUNTER — Encounter: Payer: Self-pay | Admitting: Family

## 2018-08-15 ENCOUNTER — Inpatient Hospital Stay (HOSPITAL_BASED_OUTPATIENT_CLINIC_OR_DEPARTMENT_OTHER): Payer: Medicare Other | Admitting: Family

## 2018-08-15 ENCOUNTER — Other Ambulatory Visit: Payer: Medicare Other

## 2018-08-15 ENCOUNTER — Ambulatory Visit: Payer: Medicare Other

## 2018-08-15 ENCOUNTER — Other Ambulatory Visit: Payer: Self-pay

## 2018-08-15 ENCOUNTER — Ambulatory Visit: Payer: Medicare Other | Admitting: Family

## 2018-08-15 VITALS — BP 152/67

## 2018-08-15 DIAGNOSIS — C9001 Multiple myeloma in remission: Secondary | ICD-10-CM

## 2018-08-15 DIAGNOSIS — E291 Testicular hypofunction: Secondary | ICD-10-CM

## 2018-08-15 DIAGNOSIS — N189 Chronic kidney disease, unspecified: Secondary | ICD-10-CM

## 2018-08-15 DIAGNOSIS — D631 Anemia in chronic kidney disease: Secondary | ICD-10-CM

## 2018-08-15 DIAGNOSIS — Z299 Encounter for prophylactic measures, unspecified: Secondary | ICD-10-CM

## 2018-08-15 DIAGNOSIS — Z5112 Encounter for antineoplastic immunotherapy: Secondary | ICD-10-CM | POA: Diagnosis not present

## 2018-08-15 DIAGNOSIS — D508 Other iron deficiency anemias: Secondary | ICD-10-CM

## 2018-08-15 DIAGNOSIS — C9000 Multiple myeloma not having achieved remission: Secondary | ICD-10-CM | POA: Diagnosis not present

## 2018-08-15 DIAGNOSIS — E349 Endocrine disorder, unspecified: Secondary | ICD-10-CM

## 2018-08-15 DIAGNOSIS — D509 Iron deficiency anemia, unspecified: Secondary | ICD-10-CM | POA: Diagnosis not present

## 2018-08-15 LAB — CBC WITH DIFFERENTIAL (CANCER CENTER ONLY)
Abs Immature Granulocytes: 0.01 10*3/uL (ref 0.00–0.07)
Basophils Absolute: 0 10*3/uL (ref 0.0–0.1)
Basophils Relative: 0 %
Eosinophils Absolute: 0.1 10*3/uL (ref 0.0–0.5)
Eosinophils Relative: 2 %
HCT: 34 % — ABNORMAL LOW (ref 39.0–52.0)
Hemoglobin: 10.8 g/dL — ABNORMAL LOW (ref 13.0–17.0)
Immature Granulocytes: 0 %
Lymphocytes Relative: 12 %
Lymphs Abs: 0.6 10*3/uL — ABNORMAL LOW (ref 0.7–4.0)
MCH: 27 pg (ref 26.0–34.0)
MCHC: 31.8 g/dL (ref 30.0–36.0)
MCV: 85 fL (ref 80.0–100.0)
Monocytes Absolute: 0.5 10*3/uL (ref 0.1–1.0)
Monocytes Relative: 11 %
Neutro Abs: 3.6 10*3/uL (ref 1.7–7.7)
Neutrophils Relative %: 75 %
Platelet Count: 186 10*3/uL (ref 150–400)
RBC: 4 MIL/uL — ABNORMAL LOW (ref 4.22–5.81)
RDW: 18.5 % — ABNORMAL HIGH (ref 11.5–15.5)
WBC Count: 4.8 10*3/uL (ref 4.0–10.5)
nRBC: 0 % (ref 0.0–0.2)

## 2018-08-15 LAB — CMP (CANCER CENTER ONLY)
ALT: 12 U/L (ref 0–44)
AST: 12 U/L — ABNORMAL LOW (ref 15–41)
Albumin: 4.3 g/dL (ref 3.5–5.0)
Alkaline Phosphatase: 58 U/L (ref 38–126)
Anion gap: 9 (ref 5–15)
BUN: 32 mg/dL — ABNORMAL HIGH (ref 8–23)
CO2: 29 mmol/L (ref 22–32)
Calcium: 9.1 mg/dL (ref 8.9–10.3)
Chloride: 103 mmol/L (ref 98–111)
Creatinine: 2.4 mg/dL — ABNORMAL HIGH (ref 0.61–1.24)
GFR, Est AFR Am: 31 mL/min — ABNORMAL LOW (ref >60)
GFR, Estimated: 27 mL/min — ABNORMAL LOW (ref >60)
Glucose, Bld: 113 mg/dL — ABNORMAL HIGH (ref 70–99)
Potassium: 4.1 mmol/L (ref 3.5–5.1)
Sodium: 141 mmol/L (ref 135–145)
Total Bilirubin: 0.5 mg/dL (ref 0.3–1.2)
Total Protein: 6.4 g/dL — ABNORMAL LOW (ref 6.5–8.1)

## 2018-08-15 LAB — FERRITIN: Ferritin: 1257 ng/mL — ABNORMAL HIGH (ref 24–336)

## 2018-08-15 LAB — IRON AND TIBC
Iron: 62 ug/dL (ref 42–163)
Saturation Ratios: 23 % (ref 20–55)
TIBC: 271 ug/dL (ref 202–409)
UIBC: 209 ug/dL (ref 117–376)

## 2018-08-15 MED ORDER — SODIUM CHLORIDE 0.9% FLUSH
10.0000 mL | INTRAVENOUS | Status: DC | PRN
  Filled 2018-08-15: qty 10

## 2018-08-15 MED ORDER — DEXAMETHASONE SODIUM PHOSPHATE 10 MG/ML IJ SOLN
INTRAMUSCULAR | Status: AC
  Filled 2018-08-15: qty 1

## 2018-08-15 MED ORDER — PALONOSETRON HCL INJECTION 0.25 MG/5ML
0.2500 mg | Freq: Once | INTRAVENOUS | Status: AC
  Administered 2018-08-15: 0.25 mg via INTRAVENOUS

## 2018-08-15 MED ORDER — HEPARIN SOD (PORK) LOCK FLUSH 100 UNIT/ML IV SOLN
500.0000 [IU] | Freq: Once | INTRAVENOUS | Status: AC | PRN
  Administered 2018-08-15: 500 [IU]
  Filled 2018-08-15: qty 5

## 2018-08-15 MED ORDER — HYDROMORPHONE HCL 1 MG/ML IJ SOLN
INTRAMUSCULAR | Status: AC
  Filled 2018-08-15: qty 2

## 2018-08-15 MED ORDER — SODIUM CHLORIDE 0.9 % IV SOLN
Freq: Once | INTRAVENOUS | Status: AC
  Administered 2018-08-15: 10:00:00 via INTRAVENOUS
  Filled 2018-08-15: qty 250

## 2018-08-15 MED ORDER — HYDROMORPHONE HCL 1 MG/ML IJ SOLN
2.0000 mg | Freq: Once | INTRAMUSCULAR | Status: AC
  Administered 2018-08-15: 09:00:00 2 mg via INTRAVENOUS

## 2018-08-15 MED ORDER — DEXAMETHASONE SODIUM PHOSPHATE 10 MG/ML IJ SOLN
10.0000 mg | Freq: Once | INTRAMUSCULAR | Status: AC
  Administered 2018-08-15: 10 mg via INTRAVENOUS

## 2018-08-15 MED ORDER — DEXTROSE 5 % IV SOLN
29.0000 mg/m2 | Freq: Once | INTRAVENOUS | Status: AC
  Administered 2018-08-15: 60 mg via INTRAVENOUS
  Filled 2018-08-15: qty 30

## 2018-08-15 MED ORDER — SODIUM CHLORIDE 0.9 % IV SOLN
300.0000 mg/m2 | Freq: Once | INTRAVENOUS | Status: AC
  Administered 2018-08-15: 620 mg via INTRAVENOUS
  Filled 2018-08-15: qty 31

## 2018-08-15 MED ORDER — PALONOSETRON HCL INJECTION 0.25 MG/5ML
INTRAVENOUS | Status: AC
  Filled 2018-08-15: qty 5

## 2018-08-15 NOTE — Progress Notes (Signed)
Hematology and Oncology Follow Up Visit  [name] [id number] 06-Mar-1950 68 y.o. 08/15/2018   Principle Diagnosis:  IgG kappa myeloma Anemia secondary to renal insufficiency Intermittent iron - deficiency anemia Hypotestosteronemia  Current Therapy:   Aredia 60 mg IV q 3 months-- next dose 09/2018 Aranesp 300 mcg subcu as needed for hemoglobin less than 11 DepoTestosterone400 mg q4weeks IV iron as indicated Kyprolis/Cytoxan - s/p cycle19   Interim History:  [name] is here today for follow-up and treatment. He is doing well but is a little fatigued this morning. He was up late last night cooking some treats that he brought in with him today.  M-spike last month was 0.2, IgG level 893 and kappa light chains 3.56 mg/dL.   He received his steroid injection in the back last week and he states that this helps him quite a bit. He still has generalized aches and pains with arthritis.  No swelling noted in his extremities at this time.  The neuropathy in his fingertips and lower extremities seems to be a bit better.  He is eating well and staying hydrated. His weight is stable.  No fever, chills ,n/v, cough, rash, dizziness, SOB, chest pain, palpitations, abdominal pain or change sin bowel or bladder habits.   ECOG Performance Status: 1 - Symptomatic but completely ambulatory  Medications:  Allergies as of 08/15/2018      Reactions   Iodinated Diagnostic Agents Rash   Patient states he was instructed not to take IV contrast.  In 2008 he had an unknown reaction, and was told not to take it again.  He was also told not to take it due to his kidneys.   Iodine Anxiety, Rash, Other (See Comments)   Didn't feel right "instructed not to take per MD--something with his port"      Medication List       Accurate as of August 15, 2018  8:48 AM. If you have any questions, ask your nurse or doctor.        amLODipine 10 MG tablet Commonly known as: NORVASC Take 10 mg by mouth  daily.   amLODipine 10 MG tablet Commonly known as: NORVASC   Carafate 1 GM/10ML suspension Generic drug: sucralfate Take 1 g by mouth 4 (four) times daily.   Clear Eyes All Seasons 5-6 MG/ML Soln Generic drug: Polyvinyl Alcohol-Povidone Place 2 drops into both eyes 3 (three) times daily as needed (drye eyes.).   cyclobenzaprine 10 MG tablet Commonly known as: FLEXERIL Take 10 mg by mouth 3 (three) times daily.   dicyclomine 10 MG capsule Commonly known as: BENTYL TAKE 1 CAPSULE BY MOUTH THREE TIMES A DAY What changed: See the new instructions.   dronabinol 2.5 MG capsule Commonly known as: MARINOL Take 1 capsule (2.5 mg total) by mouth 2 (two) times daily before a meal.   Embeda 30-1.2 MG Cpcr Generic drug: Morphine-Naltrexone Take 3 (three) times daily by mouth.   Embeda 20-0.8 MG Cpcr Generic drug: Morphine-Naltrexone Take 1 capsule by mouth 3 (three) times daily.   furosemide 40 MG tablet Commonly known as: LASIX Take 40 mg by mouth daily.   gabapentin 300 MG capsule Commonly known as: NEURONTIN TAKE 1 CAPSULE BY MOUTH THREE TIMES A DAY   glipiZIDE 5 MG tablet Commonly known as: GLUCOTROL Take 5 mg by mouth 2 (two) times daily before a meal.   HYDROcodone-acetaminophen 5-325 MG tablet Commonly known as: Norco Take 1-2 tablets by mouth every 6 (six) hours as needed. What changed:  reasons to take this   lactulose 10 GM/15ML solution Commonly known as: CHRONULAC Take 20 g by mouth daily.   latanoprost 0.005 % ophthalmic solution Commonly known as: XALATAN Place 1 drop into the right eye every evening.   lidocaine-prilocaine cream Commonly known as: EMLA Apply 1 application topically as needed.   linaclotide 145 MCG Caps capsule Commonly known as: LINZESS Take 1 capsule (145 mcg total) by mouth daily before breakfast. What changed:   when to take this  reasons to take this   LORazepam 0.5 MG tablet Commonly known as: Ativan Take 1 tablet (0.5  mg total) by mouth every 6 (six) hours as needed (Nausea or vomiting).   losartan 25 MG tablet Commonly known as: COZAAR Take 25 mg by mouth.   mesalamine 1.2 g EC tablet Commonly known as: Lialda Take 1 tablet (1.2 g total) by mouth daily with breakfast.   metoprolol succinate 100 MG 24 hr tablet Commonly known as: TOPROL-XL Take 100 mg by mouth daily.   mirabegron ER 50 MG Tb24 tablet Commonly known as: MYRBETRIQ Take 50 mg by mouth daily.   morphine 30 MG 12 hr tablet Commonly known as: MS CONTIN Take 30 mg by mouth 2 (two) times daily.   omeprazole 40 MG capsule Commonly known as: PRILOSEC TAKE 1 CAPSULE BY MOUTH TWICE A DAY   ondansetron 8 MG tablet Commonly known as: ZOFRAN TAKE 1 TABLET(8 MG) BY MOUTH TWICE DAILY AS NEEDED FOR NAUSEA OR VOMITING What changed: See the new instructions.   orphenadrine 100 MG tablet Commonly known as: NORFLEX Take 1 tablet (100 mg total) by mouth 2 (two) times daily.   oxyCODONE-acetaminophen 10-325 MG tablet Commonly known as: PERCOCET Take 1 tablet by mouth every 6 (six) hours as needed for pain.   pioglitazone 30 MG tablet Commonly known as: ACTOS Take 30 mg by mouth daily.   potassium chloride SA 20 MEQ tablet Commonly known as: K-DUR Take 1 tablet (20 mEq total) by mouth 2 (two) times daily.   prochlorperazine 10 MG tablet Commonly known as: COMPAZINE take 1 tablet by mouth every 6 hours if needed for nausea and vomiting What changed:   how much to take  how to take this  when to take this  reasons to take this   Relistor 150 MG Tabs Generic drug: Methylnaltrexone Bromide Take 150 mg by mouth.   Sennosides 25 MG Tabs Take 25 mg by mouth daily as needed (constipation).   Vitamin D (Ergocalciferol) 1.25 MG (50000 UT) Caps capsule Commonly known as: DRISDOL Take 50,000 Units by mouth every Saturday.   Xtampza ER 13.5 MG C12a Generic drug: oxyCODONE ER Take 1 capsule by mouth 2 (two) times daily.    Zioptan 0.0015 % Soln Generic drug: Tafluprost (PF) Place 1 drop into both eyes daily.       Allergies:  Allergies  Allergen Reactions  . Iodinated Diagnostic Agents Rash    Patient states he was instructed not to take IV contrast.  In 2008 he had an unknown reaction, and was told not to take it again.  He was also told not to take it due to his kidneys.  . Iodine Anxiety, Rash and Other (See Comments)    Didn't feel right "instructed not to take per MD--something with his port"     Past Medical History, Surgical history, Social history, and Family History were reviewed and updated.  Review of Systems: All other 10 point review of systems is negative.  Physical Exam:  vitals were not taken for this visit.   Wt Readings from Last 3 Encounters:  08/15/18 207 lb (93.9 kg)  07/25/18 195 lb (88.5 kg)  07/04/18 197 lb (89.4 kg)    Ocular: Sclerae unicteric, pupils equal, round and reactive to light Ear-nose-throat: Oropharynx clear, dentition fair Lymphatic: No cervical or supraclavicular adenopathy Lungs no rales or rhonchi, good excursion bilaterally Heart regular rate and rhythm, no murmur appreciated Abd soft, nontender, positive bowel sounds, no liver or spleen tip palpated on exam, no fluid wave  MSK no focal spinal tenderness, no joint edema Neuro: non-focal, well-oriented, appropriate affect Breasts: Deferred   Lab Results  Component Value Date   WBC 4.8 08/15/2018   HGB 10.8 (L) 08/15/2018   HCT 34.0 (L) 08/15/2018   MCV 85.0 08/15/2018   PLT 186 08/15/2018   Lab Results  Component Value Date   FERRITIN 2,245 (H) 07/25/2018   IRON 171 (H) 07/25/2018   TIBC 240 07/25/2018   UIBC 69 (L) 07/25/2018   IRONPCTSAT 71 (H) 07/25/2018   Lab Results  Component Value Date   RETICCTPCT 0.6 07/25/2018   RBC 4.00 (L) 08/15/2018   RETICCTABS 27.5 03/21/2014   Lab Results  Component Value Date   KPAFRELGTCHN 35.6 (H) 07/25/2018   LAMBDASER 22.7 07/25/2018    KAPLAMBRATIO 1.57 07/25/2018   Lab Results  Component Value Date   IGGSERUM 893 07/25/2018   IGGSERUM 847 07/25/2018   IGA 142 07/25/2018   IGA 147 07/25/2018   IGMSERUM 22 07/25/2018   IGMSERUM 25 07/25/2018   Lab Results  Component Value Date   TOTALPROTELP 6.5 07/25/2018   ALBUMINELP 3.7 07/25/2018   A1GS 0.2 07/25/2018   A2GS 1.0 07/25/2018   BETS 0.8 07/25/2018   BETA2SER 0.5 11/07/2014   GAMS 0.7 07/25/2018   MSPIKE 0.2 (H) 07/25/2018   SPEI Comment 03/28/2018     Chemistry      Component Value Date/Time   NA 142 07/25/2018 0841   NA 143 02/22/2017 0744   K 3.6 07/25/2018 0841   K 4.0 02/22/2017 0744   CL 107 07/25/2018 0841   CL 106 02/22/2017 0744   CO2 27 07/25/2018 0841   CO2 27 02/22/2017 0744   BUN 25 (H) 07/25/2018 0841   BUN 26 (H) 02/22/2017 0744   CREATININE 2.20 (H) 07/25/2018 0841   CREATININE 2.3 (H) 02/22/2017 0744      Component Value Date/Time   CALCIUM 8.9 07/25/2018 0841   CALCIUM 9.3 02/22/2017 0744   ALKPHOS 61 07/25/2018 0841   ALKPHOS 80 02/22/2017 0744   AST 29 07/25/2018 0841   ALT 31 07/25/2018 0841   ALT 22 02/22/2017 0744   BILITOT 0.3 07/25/2018 0841       Impression and Plan: [name] is a very pleasant 68 yo African American gentleman with IgG kappa myeloma.  We will continue to follow his protein studies closely. M-spike last month was noted again last month at 0.2. Today's results are pending. If his count continue to go up we will re-evaluate his treatment plan.  We will proceed with treatment today as planned.  We will see him back in another month.  He will contact our office with any questions or concerns. We can certainly see him sooner if need be.   Laverna Peace, NP 6/22/20208:48 AM

## 2018-08-15 NOTE — Patient Instructions (Signed)

## 2018-08-15 NOTE — Patient Instructions (Signed)
Cyclophosphamide injection What is this medicine? CYCLOPHOSPHAMIDE (sye kloe FOSS fa mide) is a chemotherapy drug. It slows the growth of cancer cells. This medicine is used to treat many types of cancer like lymphoma, myeloma, leukemia, breast cancer, and ovarian cancer, to name a few. This medicine may be used for other purposes; ask your health care provider or pharmacist if you have questions. COMMON BRAND NAME(S): Cytoxan, Neosar What should I tell my health care provider before I take this medicine? They need to know if you have any of these conditions: -blood disorders -history of other chemotherapy -infection -kidney disease -liver disease -recent or ongoing radiation therapy -tumors in the bone marrow -an unusual or allergic reaction to cyclophosphamide, other chemotherapy, other medicines, foods, dyes, or preservatives -pregnant or trying to get pregnant -breast-feeding How should I use this medicine? This drug is usually given as an injection into a vein or muscle or by infusion into a vein. It is administered in a hospital or clinic by a specially trained health care professional. Talk to your pediatrician regarding the use of this medicine in children. Special care may be needed. Overdosage: If you think you have taken too much of this medicine contact a poison control center or emergency room at once. NOTE: This medicine is only for you. Do not share this medicine with others. What if I miss a dose? It is important not to miss your dose. Call your doctor or health care professional if you are unable to keep an appointment. What may interact with this medicine? This medicine may interact with the following medications: -amiodarone -amphotericin B -azathioprine -certain antiviral medicines for HIV or AIDS such as protease inhibitors (e.g., indinavir, ritonavir) and zidovudine -certain blood pressure medications such as benazepril, captopril, enalapril, fosinopril,  lisinopril, moexipril, monopril, perindopril, quinapril, ramipril, trandolapril -certain cancer medications such as anthracyclines (e.g., daunorubicin, doxorubicin), busulfan, cytarabine, paclitaxel, pentostatin, tamoxifen, trastuzumab -certain diuretics such as chlorothiazide, chlorthalidone, hydrochlorothiazide, indapamide, metolazone -certain medicines that treat or prevent blood clots like warfarin -certain muscle relaxants such as succinylcholine -cyclosporine -etanercept -indomethacin -medicines to increase blood counts like filgrastim, pegfilgrastim, sargramostim -medicines used as general anesthesia -metronidazole -natalizumab This list may not describe all possible interactions. Give your health care provider a list of all the medicines, herbs, non-prescription drugs, or dietary supplements you use. Also tell them if you smoke, drink alcohol, or use illegal drugs. Some items may interact with your medicine. What should I watch for while using this medicine? Visit your doctor for checks on your progress. This drug may make you feel generally unwell. This is not uncommon, as chemotherapy can affect healthy cells as well as cancer cells. Report any side effects. Continue your course of treatment even though you feel ill unless your doctor tells you to stop. Drink water or other fluids as directed. Urinate often, even at night. In some cases, you may be given additional medicines to help with side effects. Follow all directions for their use. Call your doctor or health care professional for advice if you get a fever, chills or sore throat, or other symptoms of a cold or flu. Do not treat yourself. This drug decreases your body's ability to fight infections. Try to avoid being around people who are sick. This medicine may increase your risk to bruise or bleed. Call your doctor or health care professional if you notice any unusual bleeding. Be careful brushing and flossing your teeth or using a  toothpick because you may get an infection or bleed   more easily. If you have any dental work done, tell your dentist you are receiving this medicine. You may get drowsy or dizzy. Do not drive, use machinery, or do anything that needs mental alertness until you know how this medicine affects you. Do not become pregnant while taking this medicine or for 1 year after stopping it. Women should inform their doctor if they wish to become pregnant or think they might be pregnant. Men should not father a child while taking this medicine and for 4 months after stopping it. There is a potential for serious side effects to an unborn child. Talk to your health care professional or pharmacist for more information. Do not breast-feed an infant while taking this medicine. This medicine may interfere with the ability to have a child. This medicine has caused ovarian failure in some women. This medicine has caused reduced sperm counts in some men. You should talk with your doctor or health care professional if you are concerned about your fertility. If you are going to have surgery, tell your doctor or health care professional that you have taken this medicine. What side effects may I notice from receiving this medicine? Side effects that you should report to your doctor or health care professional as soon as possible: -allergic reactions like skin rash, itching or hives, swelling of the face, lips, or tongue -low blood counts - this medicine may decrease the number of white blood cells, red blood cells and platelets. You may be at increased risk for infections and bleeding. -signs of infection - fever or chills, cough, sore throat, pain or difficulty passing urine -signs of decreased platelets or bleeding - bruising, pinpoint red spots on the skin, black, tarry stools, blood in the urine -signs of decreased red blood cells - unusually weak or tired, fainting spells, lightheadedness -breathing problems -dark  urine -dizziness -palpitations -swelling of the ankles, feet, hands -trouble passing urine or change in the amount of urine -weight gain -yellowing of the eyes or skin Side effects that usually do not require medical attention (report to your doctor or health care professional if they continue or are bothersome): -changes in nail or skin color -hair loss -missed menstrual periods -mouth sores -nausea, vomiting This list may not describe all possible side effects. Call your doctor for medical advice about side effects. You may report side effects to FDA at 1-800-FDA-1088. Where should I keep my medicine? This drug is given in a hospital or clinic and will not be stored at home. NOTE: This sheet is a summary. It may not cover all possible information. If you have questions about this medicine, talk to your doctor, pharmacist, or health care provider.  2019 Elsevier/Gold Standard (2011-12-25 16:22:58) Carfilzomib injection What is this medicine? CARFILZOMIB (kar FILZ oh mib) targets a specific protein within cancer cells and stops the cancer cells from growing. It is used to treat multiple myeloma. This medicine may be used for other purposes; ask your health care provider or pharmacist if you have questions. COMMON BRAND NAME(S): KYPROLIS What should I tell my health care provider before I take this medicine? They need to know if you have any of these conditions: -heart disease -history of blood clots -irregular heartbeat -kidney disease -liver disease -lung or breathing disease -an unusual or allergic reaction to carfilzomib, or other medicines, foods, dyes, or preservatives -pregnant or trying to get pregnant -breast-feeding How should I use this medicine? This medicine is for injection or infusion into a vein. It is given  by a health care professional in a hospital or clinic setting. Talk to your pediatrician regarding the use of this medicine in children. Special care may be  needed. Overdosage: If you think you have taken too much of this medicine contact a poison control center or emergency room at once. NOTE: This medicine is only for you. Do not share this medicine with others. What if I miss a dose? It is important not to miss your dose. Call your doctor or health care professional if you are unable to keep an appointment. What may interact with this medicine? Interactions are not expected. Give your health care provider a list of all the medicines, herbs, non-prescription drugs, or dietary supplements you use. Also tell them if you smoke, drink alcohol, or use illegal drugs. Some items may interact with your medicine. This list may not describe all possible interactions. Give your health care provider a list of all the medicines, herbs, non-prescription drugs, or dietary supplements you use. Also tell them if you smoke, drink alcohol, or use illegal drugs. Some items may interact with your medicine. What should I watch for while using this medicine? Your condition will be monitored carefully while you are receiving this medicine. Report any side effects. Continue your course of treatment even though you feel ill unless your doctor tells you to stop. You may need blood work done while you are taking this medicine. Do not become pregnant while taking this medicine or for at least 6 months after stopping it. Women should inform their doctor if they wish to become pregnant or think they might be pregnant. There is a potential for serious side effects to an unborn child. Men should not father a child while taking this medicine and for at least 3 months after stopping it. Talk to your health care professional or pharmacist for more information. Do not breast-feed an infant while taking this medicine or for 2 weeks after the last dose. Check with your doctor or health care professional if you get an attack of severe diarrhea, nausea and vomiting, or if you sweat a lot. The  loss of too much body fluid can make it dangerous for you to take this medicine. You may get dizzy. Do not drive, use machinery, or do anything that needs mental alertness until you know how this medicine affects you. Do not stand or sit up quickly, especially if you are an older patient. This reduces the risk of dizzy or fainting spells. What side effects may I notice from receiving this medicine? Side effects that you should report to your doctor or health care professional as soon as possible: -allergic reactions like skin rash, itching or hives, swelling of the face, lips, or tongue -confusion -dizziness -feeling faint or lightheaded -fever or chills -palpitations -seizures -signs and symptoms of bleeding such as bloody or black, tarry stools; red or dark-brown urine; spitting up blood or brown material that looks like coffee grounds; red spots on the skin; unusual bruising or bleeding including from the eye, gums, or nose -signs and symptoms of a blood clot such as breathing problems; changes in vision; chest pain; severe, sudden headache; pain, swelling, warmth in the leg; trouble speaking; sudden numbness or weakness of the face, arm or leg -signs and symptoms of kidney injury like trouble passing urine or change in the amount of urine -signs and symptoms of liver injury like dark yellow or brown urine; general ill feeling or flu-like symptoms; light-colored stools; loss of appetite; nausea; right   upper belly pain; unusually weak or tired; yellowing of the eyes or skin Side effects that usually do not require medical attention (report to your doctor or health care professional if they continue or are bothersome): -back pain -cough -diarrhea -headache -muscle cramps -vomiting This list may not describe all possible side effects. Call your doctor for medical advice about side effects. You may report side effects to FDA at 1-800-FDA-1088. Where should I keep my medicine? This drug is  given in a hospital or clinic and will not be stored at home. NOTE: This sheet is a summary. It may not cover all possible information. If you have questions about this medicine, talk to your doctor, pharmacist, or health care provider.  2019 Elsevier/Gold Standard (2016-11-25 14:07:13)  

## 2018-08-15 NOTE — Progress Notes (Signed)
Ok to treat per Laverna Peace NP

## 2018-08-16 LAB — IGG, IGA, IGM
IgA: 149 mg/dL (ref 61–437)
IgG (Immunoglobin G), Serum: 919 mg/dL (ref 603–1613)
IgM (Immunoglobulin M), Srm: 27 mg/dL (ref 20–172)

## 2018-08-16 LAB — KAPPA/LAMBDA LIGHT CHAINS
Kappa free light chain: 33.6 mg/L — ABNORMAL HIGH (ref 3.3–19.4)
Kappa, lambda light chain ratio: 1.92 — ABNORMAL HIGH (ref 0.26–1.65)
Lambda free light chains: 17.5 mg/L (ref 5.7–26.3)

## 2018-08-16 LAB — TESTOSTERONE: Testosterone: 367 ng/dL (ref 264–916)

## 2018-08-18 ENCOUNTER — Encounter (HOSPITAL_BASED_OUTPATIENT_CLINIC_OR_DEPARTMENT_OTHER): Payer: Self-pay

## 2018-08-18 ENCOUNTER — Other Ambulatory Visit: Payer: Self-pay

## 2018-08-18 LAB — PROTEIN ELECTROPHORESIS, SERUM, WITH REFLEX
A/G Ratio: 1.5 (ref 0.7–1.7)
Albumin ELP: 4 g/dL (ref 2.9–4.4)
Alpha-1-Globulin: 0.2 g/dL (ref 0.0–0.4)
Alpha-2-Globulin: 0.9 g/dL (ref 0.4–1.0)
Beta Globulin: 0.7 g/dL (ref 0.7–1.3)
Gamma Globulin: 0.7 g/dL (ref 0.4–1.8)
Globulin, Total: 2.6 g/dL (ref 2.2–3.9)
M-Spike, %: 0.3 g/dL — ABNORMAL HIGH
SPEP Interpretation: 0
Total Protein ELP: 6.6 g/dL (ref 6.0–8.5)

## 2018-08-18 LAB — IMMUNOFIXATION REFLEX, SERUM
IgA: 141 mg/dL (ref 61–437)
IgG (Immunoglobin G), Serum: 895 mg/dL (ref 603–1613)
IgM (Immunoglobulin M), Srm: 26 mg/dL (ref 20–172)

## 2018-08-25 ENCOUNTER — Encounter (HOSPITAL_BASED_OUTPATIENT_CLINIC_OR_DEPARTMENT_OTHER)
Admission: RE | Admit: 2018-08-25 | Discharge: 2018-08-25 | Disposition: A | Discharge status: Home / Self Care | Payer: Medicare Other | Source: Ambulatory Visit | Attending: General Surgery | Admitting: General Surgery

## 2018-08-25 ENCOUNTER — Other Ambulatory Visit: Payer: Self-pay | Admitting: *Deleted

## 2018-08-25 ENCOUNTER — Other Ambulatory Visit: Payer: Self-pay

## 2018-08-25 ENCOUNTER — Other Ambulatory Visit (HOSPITAL_COMMUNITY)
Admission: RE | Admit: 2018-08-25 | Discharge: 2018-08-25 | Disposition: A | Discharge status: Home / Self Care | Payer: Medicare Other | Source: Ambulatory Visit | Attending: General Surgery | Admitting: General Surgery

## 2018-08-25 DIAGNOSIS — Z1159 Encounter for screening for other viral diseases: Secondary | ICD-10-CM | POA: Insufficient documentation

## 2018-08-25 DIAGNOSIS — R9431 Abnormal electrocardiogram [ECG] [EKG]: Secondary | ICD-10-CM | POA: Insufficient documentation

## 2018-08-25 DIAGNOSIS — Z01818 Encounter for other preprocedural examination: Secondary | ICD-10-CM | POA: Insufficient documentation

## 2018-08-25 LAB — SARS CORONAVIRUS 2 (TAT 6-24 HRS): SARS Coronavirus 2: NEGATIVE

## 2018-08-25 NOTE — Progress Notes (Addendum)
Gatorade 2 drink given with instructions to complete by [place], pt verbalized understanding.  EKG reviewed by Dr Marcell Barlow, will proceed with surgery as scheduled.

## 2018-08-28 NOTE — Anesthesia Preprocedure Evaluation (Addendum)
Anesthesia Evaluation  Patient identified by MRN, date of birth, ID band Patient awake    Reviewed: Allergy & Precautions, NPO status , Patient's Chart, lab work & pertinent test results  Airway Mallampati: III  TM Distance: >3 FB Neck ROM: Full    Dental no notable dental hx.    Pulmonary neg pulmonary ROS,    Pulmonary exam normal breath sounds clear to auscultation       Cardiovascular hypertension, Pt. on home beta blockers and Pt. on medications Normal cardiovascular exam Rhythm:Regular Rate:Normal  ECG: NSR, rate 77  ECHO: 1. The left ventricle has normal systolic function with an ejection fraction of 60-65%. The cavity size was normal. There is moderate concentric left ventricular hypertrophy. Left ventricular diastolic parameters were normal. No evidence of left  ventricular regional wall motion abnormalities. GLS average normal -17.3% 2. The right ventricle has normal systolic function. The cavity was normal. There is no increase in right ventricular wall thickness. 3. No evidence of mitral valve stenosis. 4. No stenosis of the aortic valve.   Neuro/Psych  Headaches, PSYCHIATRIC DISORDERS Anxiety Diabetic neuropathy  Neuromuscular disease    GI/Hepatic GERD  Medicated and Controlled,(+)     substance abuse  ,   Endo/Other  diabetes, Oral Hypoglycemic AgentsMultiple myeloma  Renal/GU Renal disease     Musculoskeletal  (+) Fibromyalgia -, narcotic dependent  Abdominal   Peds  Hematology  (+) anemia , HLD   Anesthesia Other Findings NEVUS LEFT ELBOW, EPIDERMOID CYST  Reproductive/Obstetrics                           Anesthesia Physical Anesthesia Plan  ASA: III  Anesthesia Plan: MAC   Post-op Pain Management:    Induction: Intravenous  PONV Risk Score and Plan: 1 and Ondansetron, Dexamethasone, Midazolam, Treatment may vary due to age or medical condition and Propofol  infusion  Airway Management Planned: Natural Airway  Additional Equipment:   Intra-op Plan:   Post-operative Plan:   Informed Consent: I have reviewed the patients History and Physical, chart, labs and discussed the procedure including the risks, benefits and alternatives for the proposed anesthesia with the patient or authorized representative who has indicated his/her understanding and acceptance.     Dental advisory given  Plan Discussed with: CRNA  Anesthesia Plan Comments:        Anesthesia Quick Evaluation

## 2018-08-28 NOTE — H&P (Signed)
[name] Location: [facility] Patient #: [id number] DOB: 19-Sep-1950 Single / Language: Cleophus Molt / Race: Black or African American Male       History of Present Illness       This is a pleasant 68 year old gentleman, referred by Dr. [name] for evaluation and surgical excision of a lesion of his right upper back and left elbow. Tanda Rockers is his PCP.      He has several comorbidities. Multiple myeloma, IgG, has a Port-A-Cath in place and receives chemotherapy. Non-insulin-dependent diabetes. CK D. 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 Hypertension. Cervical spine surgery. History TURP. Knee surgery.       The cystic lump in his right upper back has been there for about 3 months and is painful but has never been infected or drained. There is pigmented nevus of his left elbow that is been there for many years but it is tender also.      Social history reveals he is single. 3 children. His brother-in-law can help him out with transportation. He drives a car and is independent. Denies tobacco or alcohol.      He wants these areas excised nothing that is appropriate He'll be scheduled for excision of 2.5 cm epidermoid cyst of right upper back and excision of 1.0 cm pigmented nevus left elbow under monitored sedation, and a surgery in June. I discussed the indications, details continues, numerous risk of the surgery with him. He is aware of the risk of bleeding, infection, nerve damage with chronic numbness or pain, further surgery if it turns out to be cancer. He understands all these issues. All his questions were answered. He agrees with this plan.   Allergies Emeline Gins, [place]; 06/24/2018 8:33 AM) Iodine (Antiseptic) *ANTISEPTICS & DISINFECTANTS*  Allergies Reconciled   Medication History amLODIPine Besylate (10MG Tablet, Oral) Active. Dicyclomine HCl (10MG Capsule, Oral) Active. Cefdinir (300MG Capsule, Oral) Active. Furosemide (40MG Tablet,  Oral) Active. glipiZIDE (5MG Tablet, Oral) Active. Gabapentin (300MG Capsule, Oral) Active. LORazepam (0.5MG Tablet, Oral) Active. Losartan Potassium (25MG Tablet, Oral) Active. Meclizine HCl (15MG Capsule, Oral) Active. Methocarbamol (500MG Tablet, Oral) Active. Medications Reconciled  Social History  Alcohol use  Remotely quit alcohol use. Caffeine use  Carbonated beverages. Illicit drug use  Remotely quit drug use. Tobacco use  Former smoker.  Family History  Arthritis  Daughter, Father, Mother, Sister. Bleeding disorder  Daughter. Breast Cancer  Mother, Sister. Cerebrovascular Accident  Daughter. Diabetes Mellitus  Daughter, Mother, Sister. Heart Disease  Father, Mother, Sister. Hypertension  Daughter, Father, Mother, Sister. Melanoma  Father. Migraine Headache  Daughter. Respiratory Condition  Daughter. Seizure disorder  Daughter. Thyroid problems  Daughter.  Other Problems  Arthritis  Diabetes Mellitus  High blood pressure  Melanoma  Sleep Apnea  Transfusion history     Review of Systems  General Present- Appetite Loss, Chills, Fatigue and Fever. Not Present- Night Sweats, Weight Gain and Weight Loss. HEENT Present- Wears glasses/contact lenses. Not Present- Earache, Hearing Loss, Hoarseness, Nose Bleed, Oral Ulcers, Ringing in the Ears, Seasonal Allergies, Sinus Pain, Sore Throat, Visual Disturbances and Yellow Eyes. Cardiovascular Present- Shortness of Breath and Swelling of Extremities. Not Present- Chest Pain, Difficulty Breathing Lying Down, Leg Cramps, Palpitations and Rapid Heart Rate. Gastrointestinal Present- Bloating, Constipation and Nausea. Not Present- Abdominal Pain, Bloody Stool, Change in Bowel Habits, Chronic diarrhea, Difficulty Swallowing, Excessive gas, Gets full quickly at meals, Hemorrhoids, Indigestion, Rectal Pain and Vomiting. Neurological Present- Numbness, Tingling, Trouble walking and Weakness. Not Present-  Decreased Memory,  Fainting, Headaches, Seizures and Tremor. Endocrine Present- Cold Intolerance. Not Present- Excessive Hunger, Hair Changes, Heat Intolerance, Hot flashes and New Diabetes.  Vitals  Weight: 194.8 lb Height: 71in Body Surface Area: 2.09 m Body Mass Index: 27.17 kg/m  Temp.: 98.54F  Pulse: 107 (Regular)  BP: 148/82(Sitting, Left Arm, Standard)    Physical Exam  General Mental Status-Alert. General Appearance-Consistent with stated age. Hydration-Well hydrated. Voice-Normal.  Integumentary Note: In the right upper back parasternal area there is a 2.5 cm dimpled epidermoid cyst. Not inflamed but a little bit tender On the lateral elbow there is a 1.0 cm somewhat raised well marginated essentially pigmented nevus. Appears to involve only skin and dermis. No satellite nodules. No axillary or epitrochlear adenopathy. No cervical adenopathy   Head and Neck Head-normocephalic, atraumatic with no lesions or palpable masses. Trachea-midline. Thyroid Gland Characteristics - normal size and consistency.  Eye Eyeball - Bilateral-Extraocular movements intact. Sclera/Conjunctiva - Bilateral-No scleral icterus.  Chest and Lung Exam Chest and lung exam reveals -quiet, even and easy respiratory effort with no use of accessory muscles and on auscultation, normal breath sounds, no adventitious sounds and normal vocal resonance. Inspection Chest Wall - Normal. Back - normal. Note: Port-A-Cath palpable anterior right chest   Cardiovascular Cardiovascular examination reveals -normal heart sounds, regular rate and rhythm with no murmurs and normal pedal pulses bilaterally.  Abdomen Inspection Inspection of the abdomen reveals - No Hernias. Skin - Scar - no surgical scars. Palpation/Percussion Palpation and Percussion of the abdomen reveal - Soft, Non Tender, No Rebound tenderness, No Rigidity (guarding) and No  hepatosplenomegaly. Auscultation Auscultation of the abdomen reveals - Bowel sounds normal.  Neurologic Neurologic evaluation reveals -alert and oriented x 3 with no impairment of recent or remote memory. Mental Status-Normal.  Musculoskeletal Normal Exam - Left-Upper Extremity Strength Normal and Lower Extremity Strength Normal. Normal Exam - Right-Upper Extremity Strength Normal and Lower Extremity Strength Normal.  Lymphatic Head & Neck  General Head & Neck Lymphatics: Bilateral - Description - Normal. Axillary  General Axillary Region: Bilateral - Description - Normal. Tenderness - Non Tender. Femoral & Inguinal  Generalized Femoral & Inguinal Lymphatics: Bilateral - Description - Normal. Tenderness - Non Tender.    Assessment & Plan   NEVUS OF ELBOW, LEFT (D22.62)  The lump on her right upper back is about 2.5 cm in size and is almost certainly a benign epidermoid cyst. There is a risk that this will get larger and become infected  Under left elbow there is an 1 cm intensely pigmented nodule which is most likely a benign mole but could be a melanoma. 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 Dalbert Batman, M.D., [facility] General and Minimally invasive Surgery Breast and Colorectal Surgery Office:   [phone] Pager:    [phone]

## 2018-08-29 ENCOUNTER — Ambulatory Visit (HOSPITAL_BASED_OUTPATIENT_CLINIC_OR_DEPARTMENT_OTHER)
Admission: RE | Admit: 2018-08-29 | Discharge: 2018-08-29 | Disposition: A | Discharge status: Home / Self Care | Payer: Medicare Other | Attending: General Surgery | Admitting: General Surgery

## 2018-08-29 ENCOUNTER — Other Ambulatory Visit: Payer: Medicare Other

## 2018-08-29 ENCOUNTER — Encounter (HOSPITAL_BASED_OUTPATIENT_CLINIC_OR_DEPARTMENT_OTHER): Payer: Self-pay | Admitting: Anesthesiology

## 2018-08-29 ENCOUNTER — Other Ambulatory Visit: Payer: Self-pay

## 2018-08-29 ENCOUNTER — Ambulatory Visit: Payer: Medicare Other

## 2018-08-29 ENCOUNTER — Encounter (HOSPITAL_BASED_OUTPATIENT_CLINIC_OR_DEPARTMENT_OTHER)
Admission: RE | Disposition: A | Discharge status: Home / Self Care | Payer: Self-pay | Source: Home / Self Care | Attending: General Surgery

## 2018-08-29 ENCOUNTER — Ambulatory Visit (HOSPITAL_BASED_OUTPATIENT_CLINIC_OR_DEPARTMENT_OTHER): Payer: Medicare Other | Admitting: Anesthesiology

## 2018-08-29 DIAGNOSIS — K219 Gastro-esophageal reflux disease without esophagitis: Secondary | ICD-10-CM | POA: Diagnosis not present

## 2018-08-29 DIAGNOSIS — L720 Epidermal cyst: Secondary | ICD-10-CM

## 2018-08-29 DIAGNOSIS — F419 Anxiety disorder, unspecified: Secondary | ICD-10-CM | POA: Diagnosis not present

## 2018-08-29 DIAGNOSIS — E1140 Type 2 diabetes mellitus with diabetic neuropathy, unspecified: Secondary | ICD-10-CM | POA: Insufficient documentation

## 2018-08-29 DIAGNOSIS — I129 Hypertensive chronic kidney disease with stage 1 through stage 4 chronic kidney disease, or unspecified chronic kidney disease: Secondary | ICD-10-CM | POA: Diagnosis not present

## 2018-08-29 DIAGNOSIS — E1122 Type 2 diabetes mellitus with diabetic chronic kidney disease: Secondary | ICD-10-CM | POA: Diagnosis not present

## 2018-08-29 DIAGNOSIS — C9000 Multiple myeloma not having achieved remission: Secondary | ICD-10-CM | POA: Insufficient documentation

## 2018-08-29 DIAGNOSIS — Z87891 Personal history of nicotine dependence: Secondary | ICD-10-CM | POA: Insufficient documentation

## 2018-08-29 DIAGNOSIS — G4730 Sleep apnea, unspecified: Secondary | ICD-10-CM | POA: Diagnosis not present

## 2018-08-29 DIAGNOSIS — E785 Hyperlipidemia, unspecified: Secondary | ICD-10-CM | POA: Insufficient documentation

## 2018-08-29 DIAGNOSIS — M797 Fibromyalgia: Secondary | ICD-10-CM | POA: Insufficient documentation

## 2018-08-29 DIAGNOSIS — Z808 Family history of malignant neoplasm of other organs or systems: Secondary | ICD-10-CM | POA: Diagnosis not present

## 2018-08-29 DIAGNOSIS — Z85820 Personal history of malignant melanoma of skin: Secondary | ICD-10-CM | POA: Diagnosis not present

## 2018-08-29 DIAGNOSIS — D2262 Melanocytic nevi of left upper limb, including shoulder: Secondary | ICD-10-CM | POA: Diagnosis not present

## 2018-08-29 DIAGNOSIS — Z7984 Long term (current) use of oral hypoglycemic drugs: Secondary | ICD-10-CM | POA: Diagnosis not present

## 2018-08-29 DIAGNOSIS — L820 Inflamed seborrheic keratosis: Secondary | ICD-10-CM | POA: Insufficient documentation

## 2018-08-29 DIAGNOSIS — Z79899 Other long term (current) drug therapy: Secondary | ICD-10-CM | POA: Diagnosis not present

## 2018-08-29 DIAGNOSIS — L989 Disorder of the skin and subcutaneous tissue, unspecified: Secondary | ICD-10-CM | POA: Diagnosis present

## 2018-08-29 DIAGNOSIS — F1120 Opioid dependence, uncomplicated: Secondary | ICD-10-CM | POA: Insufficient documentation

## 2018-08-29 DIAGNOSIS — N189 Chronic kidney disease, unspecified: Secondary | ICD-10-CM | POA: Diagnosis not present

## 2018-08-29 DIAGNOSIS — D631 Anemia in chronic kidney disease: Secondary | ICD-10-CM | POA: Insufficient documentation

## 2018-08-29 HISTORY — PX: CYST EXCISION: SHX5701

## 2018-08-29 HISTORY — DX: Melanocytic nevi of left upper limb, including shoulder: D22.62

## 2018-08-29 HISTORY — DX: Epidermal cyst: L72.0

## 2018-08-29 HISTORY — PX: NEVUS EXCISION: SHX5263

## 2018-08-29 LAB — GLUCOSE, CAPILLARY
Glucose-Capillary: 166 mg/dL — ABNORMAL HIGH (ref 70–99)
Glucose-Capillary: 141 mg/dL — ABNORMAL HIGH (ref 70–99)

## 2018-08-29 SURGERY — EXCISION, NEVUS
Anesthesia: Monitor Anesthesia Care | Site: Elbow | Laterality: Right

## 2018-08-29 MED ORDER — SODIUM CHLORIDE 0.9% FLUSH: 3.0000 mL | Freq: Two times a day (BID) | INTRAVENOUS | Status: DC

## 2018-08-29 MED ORDER — GABAPENTIN 300 MG PO CAPS
300.0000 mg | ORAL_CAPSULE | ORAL | Status: AC
  Administered 2018-08-29: 300 mg via ORAL

## 2018-08-29 MED ORDER — CEFAZOLIN SODIUM-DEXTROSE 2-4 GM/100ML-% IV SOLN
2.0000 g | INTRAVENOUS | Status: AC
  Administered 2018-08-29: 2 g via INTRAVENOUS

## 2018-08-29 MED ORDER — ACETAMINOPHEN 500 MG PO TABS
1000.0000 mg | ORAL_TABLET | ORAL | Status: AC
  Administered 2018-08-29: 1000 mg via ORAL

## 2018-08-29 MED ORDER — SODIUM BICARBONATE 4 % IV SOLN
INTRAVENOUS | Status: DC | PRN
  Administered 2018-08-29: 2 mL via INTRAVENOUS

## 2018-08-29 MED ORDER — PROPOFOL 500 MG/50ML IV EMUL
INTRAVENOUS | Status: AC
  Filled 2018-08-29: qty 50

## 2018-08-29 MED ORDER — FENTANYL CITRATE (PF) 100 MCG/2ML IJ SOLN: 25.0000 ug | INTRAMUSCULAR | Status: DC | PRN

## 2018-08-29 MED ORDER — ACETAMINOPHEN 500 MG PO TABS
ORAL_TABLET | ORAL | Status: AC
  Filled 2018-08-29: qty 2

## 2018-08-29 MED ORDER — GABAPENTIN 300 MG PO CAPS
ORAL_CAPSULE | ORAL | Status: AC
  Filled 2018-08-29: qty 1

## 2018-08-29 MED ORDER — CHLORHEXIDINE GLUCONATE CLOTH 2 % EX PADS: 6.0000 | MEDICATED_PAD | Freq: Once | CUTANEOUS | Status: DC

## 2018-08-29 MED ORDER — CEFAZOLIN SODIUM-DEXTROSE 2-4 GM/100ML-% IV SOLN
INTRAVENOUS | Status: AC
  Filled 2018-08-29: qty 100

## 2018-08-29 MED ORDER — FENTANYL CITRATE (PF) 100 MCG/2ML IJ SOLN
INTRAMUSCULAR | Status: DC | PRN
  Administered 2018-08-29 (×4): 25 ug via INTRAVENOUS

## 2018-08-29 MED ORDER — ONDANSETRON HCL 4 MG/2ML IJ SOLN: 4.0000 mg | Freq: Once | INTRAMUSCULAR | Status: DC | PRN

## 2018-08-29 MED ORDER — PROPOFOL 10 MG/ML IV BOLUS
INTRAVENOUS | Status: AC
  Filled 2018-08-29: qty 20

## 2018-08-29 MED ORDER — FENTANYL CITRATE (PF) 100 MCG/2ML IJ SOLN
INTRAMUSCULAR | Status: AC
  Filled 2018-08-29: qty 2

## 2018-08-29 MED ORDER — LIDOCAINE 2% (20 MG/ML) 5 ML SYRINGE
INTRAMUSCULAR | Status: AC
  Filled 2018-08-29: qty 5

## 2018-08-29 MED ORDER — MIDAZOLAM HCL 5 MG/5ML IJ SOLN
INTRAMUSCULAR | Status: DC | PRN
  Administered 2018-08-29 (×2): 1 mg via INTRAVENOUS

## 2018-08-29 MED ORDER — LACTATED RINGERS IV SOLN
INTRAVENOUS | Status: DC
  Administered 2018-08-29 (×2): via INTRAVENOUS

## 2018-08-29 MED ORDER — ONDANSETRON HCL 4 MG/2ML IJ SOLN
INTRAMUSCULAR | Status: DC | PRN
  Administered 2018-08-29: 4 mg via INTRAVENOUS

## 2018-08-29 MED ORDER — ONDANSETRON HCL 4 MG/2ML IJ SOLN
INTRAMUSCULAR | Status: AC
  Filled 2018-08-29: qty 2

## 2018-08-29 MED ORDER — MIDAZOLAM HCL 2 MG/2ML IJ SOLN
INTRAMUSCULAR | Status: AC
  Filled 2018-08-29: qty 2

## 2018-08-29 MED ORDER — PROPOFOL 500 MG/50ML IV EMUL
INTRAVENOUS | Status: DC | PRN
  Administered 2018-08-29: 25 ug/kg/min via INTRAVENOUS

## 2018-08-29 MED ORDER — LIDOCAINE-EPINEPHRINE (PF) 1 %-1:200000 IJ SOLN
INTRAMUSCULAR | Status: DC | PRN
  Administered 2018-08-29: 9 mL

## 2018-08-29 SURGICAL SUPPLY — 63 items
ADH SKN CLS APL DERMABOND .7 (GAUZE/BANDAGES/DRESSINGS)
APL PRP STRL LF DISP 70% ISPRP (MISCELLANEOUS) ×3
APL SKNCLS STERI-STRIP NONHPOA (GAUZE/BANDAGES/DRESSINGS)
BANDAGE ELASTIC 4 VELCRO ST LF (GAUZE/BANDAGES/DRESSINGS) IMPLANT
BENZOIN TINCTURE PRP APPL 2/3 (GAUZE/BANDAGES/DRESSINGS) IMPLANT
BLADE HEX COATED 2.75 (ELECTRODE) ×5 IMPLANT
BLADE SURG 15 STRL LF DISP TIS (BLADE) ×6 IMPLANT
BLADE SURG 15 STRL SS (BLADE) ×10
BNDG GAUZE ELAST 4 BULKY (GAUZE/BANDAGES/DRESSINGS) ×3 IMPLANT
CANISTER SUCT 1200ML W/VALVE (MISCELLANEOUS) ×5 IMPLANT
CHLORAPREP W/TINT 26 (MISCELLANEOUS) ×5 IMPLANT
CLOSURE WOUND 1/2 X4 (GAUZE/BANDAGES/DRESSINGS)
COVER BACK TABLE REUSABLE LG (DRAPES) ×5 IMPLANT
COVER MAYO STAND REUSABLE (DRAPES) ×5 IMPLANT
COVER WAND RF STERILE (DRAPES) IMPLANT
DECANTER SPIKE VIAL GLASS SM (MISCELLANEOUS) IMPLANT
DERMABOND ADVANCED (GAUZE/BANDAGES/DRESSINGS)
DERMABOND ADVANCED .7 DNX12 (GAUZE/BANDAGES/DRESSINGS) IMPLANT
DRAPE LAPAROTOMY 100X72 PEDS (DRAPES) ×5 IMPLANT
DRAPE LAPAROTOMY TRNSV 102X78 (DRAPES) IMPLANT
DRAPE UTILITY XL STRL (DRAPES) ×8 IMPLANT
ELECT REM PT RETURN 9FT ADLT (ELECTROSURGICAL) ×5
ELECTRODE REM PT RTRN 9FT ADLT (ELECTROSURGICAL) ×3 IMPLANT
GAUZE 4X4 16PLY RFD (DISPOSABLE) IMPLANT
GAUZE SPONGE 4X4 12PLY STRL LF (GAUZE/BANDAGES/DRESSINGS) IMPLANT
GLOVE BIOGEL M 6.5 STRL (GLOVE) ×3 IMPLANT
GLOVE BIOGEL PI IND STRL 6.5 (GLOVE) ×1 IMPLANT
GLOVE BIOGEL PI IND STRL 8 (GLOVE) ×1 IMPLANT
GLOVE BIOGEL PI INDICATOR 6.5 (GLOVE) ×2
GLOVE BIOGEL PI INDICATOR 8 (GLOVE) ×2
GLOVE EUDERMIC 7 POWDERFREE (GLOVE) ×5 IMPLANT
GOWN STRL REUS W/ TWL LRG LVL3 (GOWN DISPOSABLE) ×3 IMPLANT
GOWN STRL REUS W/ TWL XL LVL3 (GOWN DISPOSABLE) ×3 IMPLANT
GOWN STRL REUS W/TWL LRG LVL3 (GOWN DISPOSABLE) ×5
GOWN STRL REUS W/TWL XL LVL3 (GOWN DISPOSABLE) ×5
NDL HYPO 25X1 1.5 SAFETY (NEEDLE) ×2 IMPLANT
NEEDLE HYPO 22GX1.5 SAFETY (NEEDLE) IMPLANT
NEEDLE HYPO 25X1 1.5 SAFETY (NEEDLE) ×5 IMPLANT
NS IRRIG 1000ML POUR BTL (IV SOLUTION) ×5 IMPLANT
PACK BASIN DAY SURGERY FS (CUSTOM PROCEDURE TRAY) ×5 IMPLANT
PENCIL BUTTON HOLSTER BLD 10FT (ELECTRODE) ×5 IMPLANT
SHEET MEDIUM DRAPE 40X70 STRL (DRAPES) IMPLANT
SLEEVE SCD COMPRESS KNEE MED (MISCELLANEOUS) ×3 IMPLANT
SPONGE LAP 4X18 RFD (DISPOSABLE) ×5 IMPLANT
STAPLER VISISTAT 35W (STAPLE) IMPLANT
STRIP CLOSURE SKIN 1/2X4 (GAUZE/BANDAGES/DRESSINGS) IMPLANT
SUT ETHILON 4 0 PS 2 18 (SUTURE) IMPLANT
SUT MNCRL AB 4-0 PS2 18 (SUTURE) IMPLANT
SUT SILK 2 0 SH (SUTURE) ×5 IMPLANT
SUT VIC AB 2-0 SH 27 (SUTURE)
SUT VIC AB 2-0 SH 27XBRD (SUTURE) IMPLANT
SUT VIC AB 3-0 FS2 27 (SUTURE) IMPLANT
SUT VIC AB 4-0 P-3 18XBRD (SUTURE) IMPLANT
SUT VIC AB 4-0 P3 18 (SUTURE)
SUT VICRYL 3-0 CR8 SH (SUTURE) ×5 IMPLANT
SUT VICRYL 4-0 PS2 18IN ABS (SUTURE) IMPLANT
SYR 10ML LL (SYRINGE) ×5 IMPLANT
SYR BULB 3OZ (MISCELLANEOUS) IMPLANT
TAPE HYPAFIX 4 X10 (GAUZE/BANDAGES/DRESSINGS) IMPLANT
TOWEL GREEN STERILE FF (TOWEL DISPOSABLE) ×5 IMPLANT
TUBE CONNECTING 20'X1/4 (TUBING) ×1
TUBE CONNECTING 20X1/4 (TUBING) ×4 IMPLANT
YANKAUER SUCT BULB TIP NO VENT (SUCTIONS) ×5 IMPLANT

## 2018-08-29 NOTE — Anesthesia Procedure Notes (Signed)
Procedure Name: MAC Date/Time: 08/29/2018 7:24 AM Performed by: Marrianne Mood, CRNA Pre-anesthesia Checklist: Patient identified, Emergency Drugs available, Suction available, Patient being monitored and Timeout performed Patient Re-evaluated:Patient Re-evaluated prior to induction Oxygen Delivery Method: Simple face mask Preoxygenation: Pre-oxygenation with 100% oxygen

## 2018-08-29 NOTE — Progress Notes (Signed)
Incision to back as well. This is clean, dry, and intact. Skin glue over incision. No drainage.

## 2018-08-29 NOTE — Transfer of Care (Signed)
Immediate Anesthesia Transfer of Care Note  Patient: [name]  Procedure(s) Performed: , NEVUS LEFT ELBOW (N/A Elbow) CYST REMOVAL (Right Back)  Patient Location: PACU  Anesthesia Type:MAC  Level of Consciousness: awake and patient cooperative  Airway & Oxygen Therapy: Patient Spontanous Breathing and Patient connected to face mask oxygen  Post-op Assessment: Report given to RN and Post -op Vital signs reviewed and stable  Post vital signs: Reviewed and stable  Last Vitals:  Vitals Value Taken Time  BP    Temp    Pulse 84 08/29/18 0825  Resp 15 08/29/18 0825  SpO2 100 % 08/29/18 0825  Vitals shown include unvalidated device data.  Last Pain:  Vitals:   08/29/18 0711  TempSrc: Oral  PainSc: 10-Worst pain ever         Complications: No apparent anesthesia complications

## 2018-08-29 NOTE — Anesthesia Postprocedure Evaluation (Signed)
Anesthesia Post Note  Patient: [name]  Procedure(s) Performed: , NEVUS LEFT ELBOW (N/A Elbow) CYST REMOVAL (Right Back)     Patient location during evaluation: PACU Anesthesia Type: MAC Level of consciousness: awake and alert Pain management: pain level controlled Vital Signs Assessment: post-procedure vital signs reviewed and stable Respiratory status: spontaneous breathing, nonlabored ventilation, respiratory function stable and patient connected to nasal cannula oxygen Cardiovascular status: stable and blood pressure returned to baseline Postop Assessment: no apparent nausea or vomiting Anesthetic complications: no    Last Vitals:  Vitals:   08/29/18 0845 08/29/18 0915  BP:  (!) 159/76  Pulse: 82 84  Resp: 11 20  Temp:  36.9 C  SpO2: 99% 97%    Last Pain:  Vitals:   08/29/18 0915  TempSrc:   PainSc: 0-No pain                 Crystina Borrayo P Nakiesha Rumsey

## 2018-08-29 NOTE — Op Note (Addendum)
Patient Name:           [name]   Date of Surgery:        08/29/2018  Pre op Diagnosis:      1 cm pigmented nevus left elbow                                      2.5 cm epidermoid cyst upper right back  Post op Diagnosis:    Same  Procedure:                 Excision 1 cm pigmented nevus left elbow, 1 mm margins                                     Excision 2.5 cm epidermoid cyst upper right back  Surgeon:                     Edsel Petrin. Dalbert Batman, M.D., FACS  Assistant:                      Or staff  Operative Indications:    This is a pleasant 68 year old gentleman, referred by Dr. [name] for evaluation and surgical excision of a lesion of his right upper back and left elbow. Tanda Rockers is his PCP.      He has several comorbidities. Multiple myeloma, IgG, has a Port-A-Cath in place and receives chemotherapy. Non-insulin-dependent diabetes. CKD. 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 Hypertension. Cervical spine surgery. History TURP. Knee surgery.  Takes Percocet chronically.       The cystic lump in his right upper back has been there for about 3 months and is painful but has never been infected or drained. There is pigmented nevus of his left elbow that is been there for many years but it is tender also.Marland Kitchen      He wants these areas excised. He'll be scheduled for excision of 2.5 cm epidermoid cyst of right upper back and excision of 1.0 cm pigmented nevus left elbow under monitored sedation, and a surgery in June.   Operative Findings:       On the right elbow there is a 1 cm diameter relatively flat hyperpigmented lesion with slightly irregular borders.  The right upper back, parasternal area, about the level of T2 there is a 2.5 cm subcutaneous mass with dimpled area in the center consistent with an epidermoid cyst.  Procedure in Detail:          The patient was brought to the operating room.  Intravenous sedation was administered by the anesthesia department and he was  monitored by the anesthesia department.     First, the the left elbow was prepped and draped in a sterile fashion.  Surgical timeout was performed.  Intravenous antibiotics were given.  1% Xylocaine with epinephrine was used as a local infiltration anesthetic.  A conservative longitudinal elliptical incision was made and the lesion was excised with sharp dissection and electrocautery and sent to pathology.  Hemostasis excellent.  Deeper tissues were closed with 3-0 Vicryl and the skin closed with a running subcuticular 4-0 Monocryl and Dermabond.  A small cushioned bandage was placed     The patient was then repositioned in a left lateral decubitus position.  The upper central back was prepped and draped  in a sterile fashion.  Local anesthesia was administered.  A sagittally oriented elliptical incision was made and the largest cyst was completely excised.  This was not entered or ruptured.  There was no purulence or contamination.  This was sent to pathology.  Hemostasis was excellent.  The wound was irrigated.  The deep subcutaneous tissues were closed with 3-0 Vicryl sutures and the skin closed with a running subcuticular 4-0 Monocryl and Dermabond.     Patient tolerated the procedure well was taken to PACU in stable condition.  EBL 10 cc.  Counts correct.  Complications none.    Addendum: I logged onto the PMP aware website and reviewed his prescription medication history     Merve Hotard M. Dalbert Batman, M.D., FACS General and Minimally Invasive Surgery Breast and Colorectal Surgery  08/29/2018 8:22 AM

## 2018-08-29 NOTE — Discharge Instructions (Signed)
°  NO TYLENOL BEFORE 1PM TODAY.    [facility]    You may shower, starting tomorrow Otherwise keep both wounds clean and dry Avoid any pressure or trauma to the wounds  You may drive your car starting tomorrow  There are no sutures or staples to remove Both incisions are covered with a clear plastic superglue which will wear off in about 3 weeks  You have Percocet at home that you take chronically.  Nothing further will be necessary to take care of the pain Therefore, no further prescriptions will be called in   Make an appointment to be seen in Dr. Darrel Hoover office in about 3 weeks to check the wounds            Post Anesthesia Home Care Instructions  Activity: Get plenty of rest for the remainder of the day. A responsible individual must stay with you for 24 hours following the procedure.  For the next 24 hours, DO NOT: -Drive a car -Paediatric nurse -Drink alcoholic beverages -Take any medication unless instructed by your physician -Make any legal decisions or sign important papers.  Meals: Start with liquid foods such as gelatin or soup. Progress to regular foods as tolerated. Avoid greasy, spicy, heavy foods. If nausea and/or vomiting occur, drink only clear liquids until the nausea and/or vomiting subsides. Call your physician if vomiting continues.  Special Instructions/Symptoms: Your throat may feel dry or sore from the anesthesia or the breathing tube placed in your throat during surgery. If this causes discomfort, gargle with warm salt water. The discomfort should disappear within 24 hours.  If you had a scopolamine patch placed behind your ear for the management of post- operative nausea and/or vomiting:  1. The medication in the patch is effective for 72 hours, after which it should be removed.  Wrap patch in a tissue and discard in the trash. Wash hands thoroughly with soap and water. 2. You may remove the patch earlier than 72 hours if  you experience unpleasant side effects which may include dry mouth, dizziness or visual disturbances. 3. Avoid touching the patch. Wash your hands with soap and water after contact with the patch.

## 2018-08-29 NOTE — Interval H&P Note (Signed)
History and Physical Interval Note:  08/29/2018 7:02 AM  [name]  has presented today for surgery, with the diagnosis of NEVUS LEFT ELBOW, EPIDERMOID CYST.  The various methods of treatment have been discussed with the patient and family. After consideration of risks, benefits and other options for treatment, the patient has consented to  Procedure(s): EXCISION OF CYST RIGHT UPPER BACK, NEVUS LEFT ELBOW (N/A) as a surgical intervention.  The patient's history has been reviewed, patient examined, no change in status, stable for surgery.  I have reviewed the patient's chart and labs.  Questions were answered to the patient's satisfaction.     [name]

## 2018-08-30 ENCOUNTER — Encounter (HOSPITAL_BASED_OUTPATIENT_CLINIC_OR_DEPARTMENT_OTHER): Payer: Self-pay | Admitting: General Surgery

## 2018-08-31 NOTE — Progress Notes (Signed)
Inform patient of Pathology report,. The pigmented nevus on his elbow is benign The cyst on his back is benign There is no cancer  Thanks, Dalbert Batman

## 2018-09-05 ENCOUNTER — Other Ambulatory Visit: Payer: Self-pay | Admitting: *Deleted

## 2018-09-05 DIAGNOSIS — R1319 Other dysphagia: Secondary | ICD-10-CM

## 2018-09-05 DIAGNOSIS — R109 Unspecified abdominal pain: Secondary | ICD-10-CM

## 2018-09-05 DIAGNOSIS — R1084 Generalized abdominal pain: Secondary | ICD-10-CM

## 2018-09-05 DIAGNOSIS — R1310 Dysphagia, unspecified: Secondary | ICD-10-CM

## 2018-09-05 MED ORDER — DICYCLOMINE HCL 10 MG PO CAPS: ORAL_CAPSULE | ORAL | 2 refills | Status: DC

## 2018-09-12 ENCOUNTER — Inpatient Hospital Stay: Payer: Medicare Other

## 2018-09-12 ENCOUNTER — Telehealth: Payer: Self-pay | Admitting: Hematology & Oncology

## 2018-09-12 ENCOUNTER — Encounter: Payer: Self-pay | Admitting: Hematology & Oncology

## 2018-09-12 ENCOUNTER — Inpatient Hospital Stay: Payer: Medicare Other | Attending: Hematology & Oncology | Admitting: Hematology & Oncology

## 2018-09-12 ENCOUNTER — Telehealth: Payer: Self-pay | Admitting: *Deleted

## 2018-09-12 ENCOUNTER — Other Ambulatory Visit: Payer: Self-pay

## 2018-09-12 VITALS — BP 153/76 | HR 90 | Temp 98.6°F | Resp 18 | Wt 199.0 lb

## 2018-09-12 DIAGNOSIS — N189 Chronic kidney disease, unspecified: Secondary | ICD-10-CM

## 2018-09-12 DIAGNOSIS — D508 Other iron deficiency anemias: Secondary | ICD-10-CM

## 2018-09-12 DIAGNOSIS — E349 Endocrine disorder, unspecified: Secondary | ICD-10-CM

## 2018-09-12 DIAGNOSIS — D509 Iron deficiency anemia, unspecified: Secondary | ICD-10-CM | POA: Diagnosis not present

## 2018-09-12 DIAGNOSIS — D631 Anemia in chronic kidney disease: Secondary | ICD-10-CM

## 2018-09-12 DIAGNOSIS — Z5112 Encounter for antineoplastic immunotherapy: Secondary | ICD-10-CM | POA: Diagnosis present

## 2018-09-12 DIAGNOSIS — C9000 Multiple myeloma not having achieved remission: Secondary | ICD-10-CM

## 2018-09-12 DIAGNOSIS — E291 Testicular hypofunction: Secondary | ICD-10-CM | POA: Diagnosis not present

## 2018-09-12 DIAGNOSIS — D500 Iron deficiency anemia secondary to blood loss (chronic): Secondary | ICD-10-CM

## 2018-09-12 DIAGNOSIS — Z299 Encounter for prophylactic measures, unspecified: Secondary | ICD-10-CM

## 2018-09-12 LAB — CMP (CANCER CENTER ONLY)
ALT: 51 U/L — ABNORMAL HIGH (ref 0–44)
AST: 39 U/L (ref 15–41)
Albumin: 4.1 g/dL (ref 3.5–5.0)
Alkaline Phosphatase: 83 U/L (ref 38–126)
Anion gap: 10 (ref 5–15)
BUN: 24 mg/dL — ABNORMAL HIGH (ref 8–23)
CO2: 30 mmol/L (ref 22–32)
Calcium: 7.8 mg/dL — ABNORMAL LOW (ref 8.9–10.3)
Chloride: 102 mmol/L (ref 98–111)
Creatinine: 2.07 mg/dL — ABNORMAL HIGH (ref 0.61–1.24)
GFR, Est AFR Am: 37 mL/min — ABNORMAL LOW (ref >60)
GFR, Estimated: 32 mL/min — ABNORMAL LOW (ref >60)
Glucose, Bld: 132 mg/dL — ABNORMAL HIGH (ref 70–99)
Potassium: 3 mmol/L — CL (ref 3.5–5.1)
Sodium: 142 mmol/L (ref 135–145)
Total Bilirubin: 0.4 mg/dL (ref 0.3–1.2)
Total Protein: 6.4 g/dL — ABNORMAL LOW (ref 6.5–8.1)

## 2018-09-12 LAB — CBC WITH DIFFERENTIAL (CANCER CENTER ONLY)
Abs Immature Granulocytes: 0.02 10*3/uL (ref 0.00–0.07)
Basophils Absolute: 0 10*3/uL (ref 0.0–0.1)
Basophils Relative: 1 %
Eosinophils Absolute: 0.1 10*3/uL (ref 0.0–0.5)
Eosinophils Relative: 2 %
HCT: 34 % — ABNORMAL LOW (ref 39.0–52.0)
Hemoglobin: 10.7 g/dL — ABNORMAL LOW (ref 13.0–17.0)
Immature Granulocytes: 0 %
Lymphocytes Relative: 16 %
Lymphs Abs: 0.9 10*3/uL (ref 0.7–4.0)
MCH: 26.6 pg (ref 26.0–34.0)
MCHC: 31.5 g/dL (ref 30.0–36.0)
MCV: 84.6 fL (ref 80.0–100.0)
Monocytes Absolute: 0.6 10*3/uL (ref 0.1–1.0)
Monocytes Relative: 12 %
Neutro Abs: 3.7 10*3/uL (ref 1.7–7.7)
Neutrophils Relative %: 69 %
Platelet Count: 153 10*3/uL (ref 150–400)
RBC: 4.02 MIL/uL — ABNORMAL LOW (ref 4.22–5.81)
RDW: 16 % — ABNORMAL HIGH (ref 11.5–15.5)
WBC Count: 5.3 10*3/uL (ref 4.0–10.5)
nRBC: 0 % (ref 0.0–0.2)

## 2018-09-12 LAB — IRON AND TIBC
Iron: 100 ug/dL (ref 42–163)
Saturation Ratios: 41 % (ref 20–55)
TIBC: 244 ug/dL (ref 202–409)
UIBC: 144 ug/dL (ref 117–376)

## 2018-09-12 LAB — FERRITIN: Ferritin: 2010 ng/mL — ABNORMAL HIGH (ref 24–336)

## 2018-09-12 MED ORDER — SODIUM CHLORIDE 0.9 % IV SOLN
Freq: Once | INTRAVENOUS | Status: AC
  Administered 2018-09-12: 11:00:00 via INTRAVENOUS
  Filled 2018-09-12: qty 250

## 2018-09-12 MED ORDER — DEXAMETHASONE SODIUM PHOSPHATE 10 MG/ML IJ SOLN
10.0000 mg | Freq: Once | INTRAMUSCULAR | Status: AC
  Administered 2018-09-12: 11:00:00 10 mg via INTRAVENOUS

## 2018-09-12 MED ORDER — HEPARIN SOD (PORK) LOCK FLUSH 100 UNIT/ML IV SOLN
500.0000 [IU] | Freq: Once | INTRAVENOUS | Status: AC | PRN
  Administered 2018-09-12: 500 [IU]
  Filled 2018-09-12: qty 5

## 2018-09-12 MED ORDER — HYDROMORPHONE HCL 1 MG/ML IJ SOLN
INTRAMUSCULAR | Status: AC
  Filled 2018-09-12: qty 2

## 2018-09-12 MED ORDER — HYDROMORPHONE HCL 1 MG/ML IJ SOLN
2.0000 mg | Freq: Once | INTRAMUSCULAR | Status: AC
  Administered 2018-09-12: 2 mg via INTRAVENOUS

## 2018-09-12 MED ORDER — FUROSEMIDE 10 MG/ML IJ SOLN
INTRAMUSCULAR | Status: AC
  Filled 2018-09-12: qty 4

## 2018-09-12 MED ORDER — SODIUM CHLORIDE 0.9 % IV SOLN
Freq: Once | INTRAVENOUS | Status: AC
  Filled 2018-09-12: qty 250

## 2018-09-12 MED ORDER — TESTOSTERONE CYPIONATE 200 MG/ML IM SOLN
INTRAMUSCULAR | Status: AC
  Filled 2018-09-12: qty 2

## 2018-09-12 MED ORDER — SODIUM CHLORIDE 0.9 % IV SOLN
300.0000 mg/m2 | Freq: Once | INTRAVENOUS | Status: AC
  Administered 2018-09-12: 620 mg via INTRAVENOUS
  Filled 2018-09-12: qty 31

## 2018-09-12 MED ORDER — DEXAMETHASONE SODIUM PHOSPHATE 10 MG/ML IJ SOLN
INTRAMUSCULAR | Status: AC
  Filled 2018-09-12: qty 1

## 2018-09-12 MED ORDER — DEXTROSE 5 % IV SOLN
28.6000 mg/m2 | Freq: Once | INTRAVENOUS | Status: AC
  Administered 2018-09-12: 60 mg via INTRAVENOUS
  Filled 2018-09-12: qty 30

## 2018-09-12 MED ORDER — FUROSEMIDE 10 MG/ML IJ SOLN: 40.0000 mg | Freq: Once | INTRAMUSCULAR | Status: AC

## 2018-09-12 MED ORDER — FUROSEMIDE 10 MG/ML IJ SOLN
40.0000 mg | Freq: Once | INTRAMUSCULAR | Status: AC
  Administered 2018-09-12: 40 mg via INTRAVENOUS

## 2018-09-12 MED ORDER — TESTOSTERONE CYPIONATE 200 MG/ML IM SOLN
400.0000 mg | Freq: Once | INTRAMUSCULAR | Status: AC
  Administered 2018-09-12: 400 mg via INTRAMUSCULAR

## 2018-09-12 MED ORDER — PALONOSETRON HCL INJECTION 0.25 MG/5ML
INTRAVENOUS | Status: AC
  Filled 2018-09-12: qty 5

## 2018-09-12 MED ORDER — PALONOSETRON HCL INJECTION 0.25 MG/5ML
0.2500 mg | Freq: Once | INTRAVENOUS | Status: AC
  Administered 2018-09-12: 0.25 mg via INTRAVENOUS

## 2018-09-12 MED ORDER — SODIUM CHLORIDE 0.9% FLUSH
10.0000 mL | INTRAVENOUS | Status: DC | PRN
  Administered 2018-09-12: 10 mL
  Filled 2018-09-12: qty 10

## 2018-09-12 NOTE — Progress Notes (Signed)
Ok to treat with elevated creatinine per Dr [name]. dph

## 2018-09-12 NOTE — Addendum Note (Signed)
Addended by: Burney Gauze R on: 09/12/2018 10:46 AM   Modules accepted: Orders

## 2018-09-12 NOTE — Patient Instructions (Signed)
Implanted Port Insertion, Care After This sheet gives you information about how to care for yourself after your procedure. Your health care provider may also give you more specific instructions. If you have problems or questions, contact your health care provider. What can I expect after the procedure? After the procedure, it is common to have:  Discomfort at the port insertion site.  Bruising on the skin over the port. This should improve over 3-4 days. Follow these instructions at home: Port care  After your port is placed, you will get a manufacturer's information card. The card has information about your port. Keep this card with you at all times.  Take care of the port as told by your health care provider. Ask your health care provider if you or a family member can get training for taking care of the port at home. A home health care nurse may also take care of the port.  Make sure to remember what type of port you have. Incision care      Follow instructions from your health care provider about how to take care of your port insertion site. Make sure you: ? Wash your hands with soap and water before and after you change your bandage (dressing). If soap and water are not available, use hand sanitizer. ? Change your dressing as told by your health care provider. ? Leave stitches (sutures), skin glue, or adhesive strips in place. These skin closures may need to stay in place for 2 weeks or longer. If adhesive strip edges start to loosen and curl up, you may trim the loose edges. Do not remove adhesive strips completely unless your health care provider tells you to do that.  Check your port insertion site every day for signs of infection. Check for: ? Redness, swelling, or pain. ? Fluid or blood. ? Warmth. ? Pus or a bad smell. Activity  Return to your normal activities as told by your health care provider. Ask your health care provider what activities are safe for you.  Do not  lift anything that is heavier than 10 lb (4.5 kg), or the limit that you are told, until your health care provider says that it is safe. General instructions  Take over-the-counter and prescription medicines only as told by your health care provider.  Do not take baths, swim, or use a hot tub until your health care provider approves. Ask your health care provider if you may take showers. You may only be allowed to take sponge baths.  Do not drive for 24 hours if you were given a sedative during your procedure.  Wear a medical alert bracelet in case of an emergency. This will tell any health care providers that you have a port.  Keep all follow-up visits as told by your health care provider. This is important. Contact a health care provider if:  You cannot flush your port with saline as directed, or you cannot draw blood from the port.  You have a fever or chills.  You have redness, swelling, or pain around your port insertion site.  You have fluid or blood coming from your port insertion site.  Your port insertion site feels warm to the touch.  You have pus or a bad smell coming from the port insertion site. Get help right away if:  You have chest pain or shortness of breath.  You have bleeding from your port that you cannot control. Summary  Take care of the port as told by your health   care provider. Keep the manufacturer's information card with you at all times.  Change your dressing as told by your health care provider.  Contact a health care provider if you have a fever or chills or if you have redness, swelling, or pain around your port insertion site.  Keep all follow-up visits as told by your health care provider. This information is not intended to replace advice given to you by your health care provider. Make sure you discuss any questions you have with your health care provider. Document Released: 11/30/2012 Document Revised: 09/07/2017 Document Reviewed: 09/07/2017  Elsevier Patient Education  2020 [employer]  

## 2018-09-12 NOTE — Telephone Encounter (Signed)
Appointments scheduled avs/calendar printed per 7/20 los

## 2018-09-12 NOTE — Progress Notes (Signed)
Hematology and Oncology Follow Up Visit  [name] [id number] 1950-08-17 68 y.o. 09/12/2018   Principle Diagnosis:  IgG kappa myeloma Anemia secondary to renal insufficiency Intermittent iron - deficiency anemia Hypotestosteronemia  Current Therapy:   Aredia 60 mg IV q 3 months-- next dose 09/2018 Aranesp 300 mcg subcu as needed for hemoglobin less than 11 DepoTestosterone400 mg q 4 weeks IV iron as indicated Kyprolis/Cytoxan - s/p cycle19   Interim History:  [name] is here today for follow-up and treatment.  Overall, I think he is doing fairly well.  He is having really no specific complaints.  Does have quite a bit of swelling in his legs.  He is on Lasix.  I will give him a dose of IV Lasix in the office today.  He has had no issues with bleeding.  His fibromyalgia is causing a lot of problems.  He still is having some back issues.  His myeloma might be trying to progress.  Back in June, his M spike was 0.3 g/dL.  We will have to see what it is today.  He has had no issues with constipation.  He has had a couple episodes of diarrhea.  He has had no fever.  He has had no cough.  He has had no rashes.  Overall, his performance status is ECOG 1.     Medications:  Allergies as of 09/12/2018      Reactions   Iodinated Diagnostic Agents Rash   Patient states he was instructed not to take IV contrast.  In 2008 he had an unknown reaction, and was told not to take it again.  He was also told not to take it due to his kidneys.   Iodine Anxiety, Rash, Other (See Comments)   Didn't feel right "instructed not to take per MD--something with his port"      Medication List       Accurate as of September 12, 2018 10:13 AM. If you have any questions, ask your nurse or doctor.        amLODipine 10 MG tablet Commonly known as: NORVASC Take 10 mg by mouth daily.   Carafate 1 GM/10ML suspension Generic drug: sucralfate Take 1 g by mouth 4 (four) times daily.   Clear  Eyes All Seasons 5-6 MG/ML Soln Generic drug: Polyvinyl Alcohol-Povidone Place 2 drops into both eyes 3 (three) times daily as needed (drye eyes.).   cyclobenzaprine 10 MG tablet Commonly known as: FLEXERIL Take 10 mg by mouth 3 (three) times daily.   dicyclomine 10 MG capsule Commonly known as: BENTYL TAKE 1 CAPSULE BY MOUTH THREE TIMES A DAY   dronabinol 2.5 MG capsule Commonly known as: MARINOL Take 1 capsule (2.5 mg total) by mouth 2 (two) times daily before a meal.   Embeda 30-1.2 MG Cpcr Generic drug: Morphine-Naltrexone Take 3 (three) times daily by mouth.   Embeda 20-0.8 MG Cpcr Generic drug: Morphine-Naltrexone Take 1 capsule by mouth 3 (three) times daily.   furosemide 40 MG tablet Commonly known as: LASIX Take 40 mg by mouth daily.   gabapentin 300 MG capsule Commonly known as: NEURONTIN TAKE 1 CAPSULE BY MOUTH THREE TIMES A DAY   glipiZIDE 5 MG tablet Commonly known as: GLUCOTROL Take 5 mg by mouth 2 (two) times daily before a meal.   HYDROcodone-acetaminophen 5-325 MG tablet Commonly known as: Norco Take 1-2 tablets by mouth every 6 (six) hours as needed. What changed: reasons to take this   lactulose 10 GM/15ML solution Commonly  known as: CHRONULAC Take 20 g by mouth daily.   latanoprost 0.005 % ophthalmic solution Commonly known as: XALATAN Place 1 drop into the right eye every evening.   lidocaine-prilocaine cream Commonly known as: EMLA Apply 1 application topically as needed.   linaclotide 145 MCG Caps capsule Commonly known as: LINZESS Take 1 capsule (145 mcg total) by mouth daily before breakfast. What changed:   when to take this  reasons to take this   LORazepam 0.5 MG tablet Commonly known as: Ativan Take 1 tablet (0.5 mg total) by mouth every 6 (six) hours as needed (Nausea or vomiting).   losartan 25 MG tablet Commonly known as: COZAAR Take 25 mg by mouth.   mesalamine 1.2 g EC tablet Commonly known as: Lialda Take 1  tablet (1.2 g total) by mouth daily with breakfast.   metoprolol succinate 100 MG 24 hr tablet Commonly known as: TOPROL-XL Take 100 mg by mouth daily.   mirabegron ER 50 MG Tb24 tablet Commonly known as: MYRBETRIQ Take 50 mg by mouth daily.   morphine 30 MG 12 hr tablet Commonly known as: MS CONTIN Take 30 mg by mouth 2 (two) times daily.   omeprazole 40 MG capsule Commonly known as: PRILOSEC TAKE 1 CAPSULE BY MOUTH TWICE A DAY   ondansetron 8 MG tablet Commonly known as: ZOFRAN TAKE 1 TABLET(8 MG) BY MOUTH TWICE DAILY AS NEEDED FOR NAUSEA OR VOMITING What changed: See the new instructions.   orphenadrine 100 MG tablet Commonly known as: NORFLEX Take 1 tablet (100 mg total) by mouth 2 (two) times daily.   oxyCODONE-acetaminophen 10-325 MG tablet Commonly known as: PERCOCET Take 1 tablet by mouth every 6 (six) hours as needed for pain.   pioglitazone 30 MG tablet Commonly known as: ACTOS Take 30 mg by mouth daily.   potassium chloride SA 20 MEQ tablet Commonly known as: K-DUR Take 1 tablet (20 mEq total) by mouth 2 (two) times daily.   prochlorperazine 10 MG tablet Commonly known as: COMPAZINE take 1 tablet by mouth every 6 hours if needed for nausea and vomiting What changed:   how much to take  how to take this  when to take this  reasons to take this   Relistor 150 MG Tabs Generic drug: Methylnaltrexone Bromide Take 150 mg by mouth.   Sennosides 25 MG Tabs Take 25 mg by mouth daily as needed (constipation).   Vitamin D (Ergocalciferol) 1.25 MG (50000 UT) Caps capsule Commonly known as: DRISDOL Take 50,000 Units by mouth every Saturday.   Xtampza ER 13.5 MG C12a Generic drug: oxyCODONE ER Take 1 capsule by mouth 2 (two) times daily.   Zioptan 0.0015 % Soln Generic drug: Tafluprost (PF) Place 1 drop into both eyes daily.       Allergies:  Allergies  Allergen Reactions  . Iodinated Diagnostic Agents Rash    Patient states he was instructed  not to take IV contrast.  In 2008 he had an unknown reaction, and was told not to take it again.  He was also told not to take it due to his kidneys.  . Iodine Anxiety, Rash and Other (See Comments)    Didn't feel right "instructed not to take per MD--something with his port"     Past Medical History, Surgical history, Social history, and Family History were reviewed and updated.  Review of Systems: Review of Systems  Constitutional: Positive for malaise/fatigue.  HENT: Negative.   Eyes: Negative.   Respiratory: Negative.   Cardiovascular:  Positive for leg swelling.  Gastrointestinal: Positive for nausea.  Genitourinary: Negative.   Musculoskeletal: Positive for joint pain and myalgias.  Skin: Negative.   Neurological: Positive for weakness.  Endo/Heme/Allergies: Negative.   Psychiatric/Behavioral: Negative.      Physical Exam:  weight is 199 lb (90.3 kg). His oral temperature is 98.6 F (37 C). His blood pressure is 153/76 (abnormal) and his pulse is 90. His respiration is 18 and oxygen saturation is 100%.   Wt Readings from Last 3 Encounters:  09/12/18 199 lb (90.3 kg)  08/29/18 196 lb 13.9 oz (89.3 kg)  08/15/18 207 lb (93.9 kg)    Physical Exam Vitals signs reviewed.  HENT:     Head: Normocephalic and atraumatic.  Eyes:     Pupils: Pupils are equal, round, and reactive to light.  Neck:     Musculoskeletal: Normal range of motion.  Cardiovascular:     Rate and Rhythm: Normal rate and regular rhythm.     Heart sounds: Normal heart sounds.  Pulmonary:     Effort: Pulmonary effort is normal.     Breath sounds: Normal breath sounds.  Abdominal:     General: Bowel sounds are normal.     Palpations: Abdomen is soft.  Musculoskeletal: Normal range of motion.        General: No tenderness or deformity.     Right lower leg: Edema present.     Left lower leg: Edema present.  Lymphadenopathy:     Cervical: No cervical adenopathy.  Skin:    General: Skin is warm and  dry.     Findings: No erythema or rash.  Neurological:     Mental Status: He is alert and oriented to person, place, and time.  Psychiatric:        Behavior: Behavior normal.        Thought Content: Thought content normal.        Judgment: Judgment normal.      Lab Results  Component Value Date   WBC 5.3 09/12/2018   HGB 10.7 (L) 09/12/2018   HCT 34.0 (L) 09/12/2018   MCV 84.6 09/12/2018   PLT 153 09/12/2018   Lab Results  Component Value Date   FERRITIN 1,257 (H) 08/15/2018   IRON 62 08/15/2018   TIBC 271 08/15/2018   UIBC 209 08/15/2018   IRONPCTSAT 23 08/15/2018   Lab Results  Component Value Date   RETICCTPCT 0.6 07/25/2018   RBC 4.02 (L) 09/12/2018   RETICCTABS 27.5 03/21/2014   Lab Results  Component Value Date   KPAFRELGTCHN 33.6 (H) 08/15/2018   LAMBDASER 17.5 08/15/2018   KAPLAMBRATIO 1.92 (H) 08/15/2018   Lab Results  Component Value Date   IGGSERUM 919 08/15/2018   IGGSERUM 895 08/15/2018   IGA 149 08/15/2018   IGA 141 08/15/2018   IGMSERUM 27 08/15/2018   IGMSERUM 26 08/15/2018   Lab Results  Component Value Date   TOTALPROTELP 6.6 08/15/2018   ALBUMINELP 4.0 08/15/2018   A1GS 0.2 08/15/2018   A2GS 0.9 08/15/2018   BETS 0.7 08/15/2018   BETA2SER 0.5 11/07/2014   GAMS 0.7 08/15/2018   MSPIKE 0.3 (H) 08/15/2018   SPEI Comment 03/28/2018     Chemistry      Component Value Date/Time   NA 142 09/12/2018 0825   NA 143 02/22/2017 0744   K 3.0 (LL) 09/12/2018 0825   K 4.0 02/22/2017 0744   CL 102 09/12/2018 0825   CL 106 02/22/2017 0744   CO2 30 09/12/2018  0825   CO2 27 02/22/2017 0744   BUN 24 (H) 09/12/2018 0825   BUN 26 (H) 02/22/2017 0744   CREATININE 2.07 (H) 09/12/2018 0825   CREATININE 2.3 (H) 02/22/2017 0744      Component Value Date/Time   CALCIUM 7.8 (L) 09/12/2018 0825   CALCIUM 9.3 02/22/2017 0744   ALKPHOS 83 09/12/2018 0825   ALKPHOS 80 02/22/2017 0744   AST 39 09/12/2018 0825   ALT 51 (H) 09/12/2018 0825   ALT 22  02/22/2017 0744   BILITOT 0.4 09/12/2018 0825       Impression and Plan: [name] is a very pleasant 68 yo African American gentleman with IgG kappa myeloma.  He will be interesting to see what his myeloma studies look like today.  If we find that his levels are increasing, I might try to increase his Kyprolis a little bit.  He is on a relatively low dose of Kyprolis.  I think his calcium might be a little bit too low to give him Aredia today.  He says he has not been taking his calcium pills.  His testosterone level has been doing fairly well.  We will plan to get him back in another couple weeks.    Volanda Napoleon, MD 7/20/202010:13 AM

## 2018-09-12 NOTE — Patient Instructions (Signed)
[facility] Discharge Instructions for Patients Receiving Chemotherapy  Today you received the following chemotherapy agents Kyprolis, Cytoxan  To help prevent nausea and vomiting after your treatment, we encourage you to take your nausea medication    If you develop nausea and vomiting that is not controlled by your nausea medication, call the clinic.   BELOW ARE SYMPTOMS THAT SHOULD BE REPORTED IMMEDIATELY:  *FEVER GREATER THAN 100.5 F  *CHILLS WITH OR WITHOUT FEVER  NAUSEA AND VOMITING THAT IS NOT CONTROLLED WITH YOUR NAUSEA MEDICATION  *UNUSUAL SHORTNESS OF BREATH  *UNUSUAL BRUISING OR BLEEDING  TENDERNESS IN MOUTH AND THROAT WITH OR WITHOUT PRESENCE OF ULCERS  *URINARY PROBLEMS  *BOWEL PROBLEMS  UNUSUAL RASH Items with * indicate a potential emergency and should be followed up as soon as possible.  Feel free to call the clinic should you have any questions or concerns. The clinic phone number is [phone].  Please show the [facility] at check-in to the Emergency Department and triage nurse.

## 2018-09-12 NOTE — Telephone Encounter (Signed)
Richardson Landry from lab informed me of a Potassium level of 3.0. MD notified.

## 2018-09-13 LAB — KAPPA/LAMBDA LIGHT CHAINS
Kappa free light chain: 40.1 mg/L — ABNORMAL HIGH (ref 3.3–19.4)
Kappa, lambda light chain ratio: 1.82 — ABNORMAL HIGH (ref 0.26–1.65)
Lambda free light chains: 22 mg/L (ref 5.7–26.3)

## 2018-09-13 LAB — PROTEIN ELECTROPHORESIS, SERUM
A/G Ratio: 1.6 (ref 0.7–1.7)
Albumin ELP: 4 g/dL (ref 2.9–4.4)
Alpha-1-Globulin: 0.2 g/dL (ref 0.0–0.4)
Alpha-2-Globulin: 0.9 g/dL (ref 0.4–1.0)
Beta Globulin: 0.7 g/dL (ref 0.7–1.3)
Gamma Globulin: 0.7 g/dL (ref 0.4–1.8)
Globulin, Total: 2.5 g/dL (ref 2.2–3.9)
Total Protein ELP: 6.5 g/dL (ref 6.0–8.5)

## 2018-09-13 LAB — IGG, IGA, IGM
IgA: 160 mg/dL (ref 61–437)
IgG (Immunoglobin G), Serum: 880 mg/dL (ref 603–1613)
IgM (Immunoglobulin M), Srm: 28 mg/dL (ref 20–172)

## 2018-09-13 LAB — TESTOSTERONE: Testosterone: 396 ng/dL (ref 264–916)

## 2018-09-26 ENCOUNTER — Inpatient Hospital Stay: Payer: Medicare Other | Attending: Hematology & Oncology

## 2018-09-26 ENCOUNTER — Inpatient Hospital Stay: Payer: Medicare Other

## 2018-09-26 ENCOUNTER — Encounter: Payer: Self-pay | Admitting: Family

## 2018-09-26 ENCOUNTER — Inpatient Hospital Stay (HOSPITAL_BASED_OUTPATIENT_CLINIC_OR_DEPARTMENT_OTHER): Payer: Medicare Other | Admitting: Family

## 2018-09-26 ENCOUNTER — Other Ambulatory Visit: Payer: Self-pay

## 2018-09-26 VITALS — BP 175/75 | HR 85 | Temp 99.3°F | Resp 17 | Wt 202.0 lb

## 2018-09-26 VITALS — BP 147/75 | HR 90

## 2018-09-26 DIAGNOSIS — C9000 Multiple myeloma not having achieved remission: Secondary | ICD-10-CM

## 2018-09-26 DIAGNOSIS — G629 Polyneuropathy, unspecified: Secondary | ICD-10-CM | POA: Diagnosis not present

## 2018-09-26 DIAGNOSIS — Z5112 Encounter for antineoplastic immunotherapy: Secondary | ICD-10-CM | POA: Insufficient documentation

## 2018-09-26 DIAGNOSIS — N189 Chronic kidney disease, unspecified: Secondary | ICD-10-CM | POA: Insufficient documentation

## 2018-09-26 DIAGNOSIS — D631 Anemia in chronic kidney disease: Secondary | ICD-10-CM

## 2018-09-26 DIAGNOSIS — E349 Endocrine disorder, unspecified: Secondary | ICD-10-CM | POA: Diagnosis not present

## 2018-09-26 DIAGNOSIS — D500 Iron deficiency anemia secondary to blood loss (chronic): Secondary | ICD-10-CM

## 2018-09-26 DIAGNOSIS — M4712 Other spondylosis with myelopathy, cervical region: Secondary | ICD-10-CM

## 2018-09-26 DIAGNOSIS — E119 Type 2 diabetes mellitus without complications: Secondary | ICD-10-CM | POA: Insufficient documentation

## 2018-09-26 DIAGNOSIS — Z5111 Encounter for antineoplastic chemotherapy: Secondary | ICD-10-CM | POA: Insufficient documentation

## 2018-09-26 DIAGNOSIS — Z299 Encounter for prophylactic measures, unspecified: Secondary | ICD-10-CM

## 2018-09-26 LAB — CBC WITH DIFFERENTIAL (CANCER CENTER ONLY)
Abs Immature Granulocytes: 0.02 10*3/uL (ref 0.00–0.07)
Basophils Absolute: 0 10*3/uL (ref 0.0–0.1)
Basophils Relative: 1 %
Eosinophils Absolute: 0.1 10*3/uL (ref 0.0–0.5)
Eosinophils Relative: 2 %
HCT: 33.2 % — ABNORMAL LOW (ref 39.0–52.0)
Hemoglobin: 10.5 g/dL — ABNORMAL LOW (ref 13.0–17.0)
Immature Granulocytes: 0 %
Lymphocytes Relative: 14 %
Lymphs Abs: 0.8 10*3/uL (ref 0.7–4.0)
MCH: 26.7 pg (ref 26.0–34.0)
MCHC: 31.6 g/dL (ref 30.0–36.0)
MCV: 84.5 fL (ref 80.0–100.0)
Monocytes Absolute: 0.7 10*3/uL (ref 0.1–1.0)
Monocytes Relative: 13 %
Neutro Abs: 4 10*3/uL (ref 1.7–7.7)
Neutrophils Relative %: 70 %
Platelet Count: 160 10*3/uL (ref 150–400)
RBC: 3.93 MIL/uL — ABNORMAL LOW (ref 4.22–5.81)
RDW: 16.6 % — ABNORMAL HIGH (ref 11.5–15.5)
WBC Count: 5.7 10*3/uL (ref 4.0–10.5)
nRBC: 0 % (ref 0.0–0.2)

## 2018-09-26 LAB — CMP (CANCER CENTER ONLY)
ALT: 19 U/L (ref 0–44)
AST: 22 U/L (ref 15–41)
Albumin: 4 g/dL (ref 3.5–5.0)
Alkaline Phosphatase: 70 U/L (ref 38–126)
Anion gap: 6 (ref 5–15)
BUN: 14 mg/dL (ref 8–23)
CO2: 32 mmol/L (ref 22–32)
Calcium: 9 mg/dL (ref 8.9–10.3)
Chloride: 104 mmol/L (ref 98–111)
Creatinine: 2.08 mg/dL — ABNORMAL HIGH (ref 0.61–1.24)
GFR, Est AFR Am: 37 mL/min — ABNORMAL LOW (ref >60)
GFR, Estimated: 32 mL/min — ABNORMAL LOW (ref >60)
Glucose, Bld: 72 mg/dL (ref 70–99)
Potassium: 3.9 mmol/L (ref 3.5–5.1)
Sodium: 142 mmol/L (ref 135–145)
Total Bilirubin: 0.5 mg/dL (ref 0.3–1.2)
Total Protein: 6.5 g/dL (ref 6.5–8.1)

## 2018-09-26 LAB — IRON AND TIBC
Iron: 72 ug/dL (ref 42–163)
Saturation Ratios: 31 % (ref 20–55)
TIBC: 233 ug/dL (ref 202–409)
UIBC: 160 ug/dL (ref 117–376)

## 2018-09-26 LAB — FERRITIN: Ferritin: 2208 ng/mL — ABNORMAL HIGH (ref 24–336)

## 2018-09-26 MED ORDER — SODIUM CHLORIDE 0.9 % IV SOLN
300.0000 mg/m2 | Freq: Once | INTRAVENOUS | Status: AC
  Administered 2018-09-26: 15:00:00 620 mg via INTRAVENOUS
  Filled 2018-09-26: qty 31

## 2018-09-26 MED ORDER — SODIUM CHLORIDE 0.9% FLUSH
10.0000 mL | INTRAVENOUS | Status: DC | PRN
  Administered 2018-09-26 (×2): 10 mL
  Filled 2018-09-26: qty 10

## 2018-09-26 MED ORDER — SODIUM CHLORIDE 0.9 % IV SOLN
60.0000 mg | Freq: Once | INTRAVENOUS | Status: DC
  Filled 2018-09-26: qty 20

## 2018-09-26 MED ORDER — DEXTROSE 5 % IV SOLN
29.0000 mg/m2 | Freq: Once | INTRAVENOUS | Status: AC
  Administered 2018-09-26: 15:00:00 60 mg via INTRAVENOUS
  Filled 2018-09-26: qty 30

## 2018-09-26 MED ORDER — HYDROMORPHONE HCL 1 MG/ML IJ SOLN
INTRAMUSCULAR | Status: AC
  Filled 2018-09-26: qty 2

## 2018-09-26 MED ORDER — ALTEPLASE 2 MG IJ SOLR
2.0000 mg | Freq: Once | INTRAMUSCULAR | Status: DC | PRN
  Filled 2018-09-26: qty 2

## 2018-09-26 MED ORDER — PALONOSETRON HCL INJECTION 0.25 MG/5ML
INTRAVENOUS | Status: AC
  Filled 2018-09-26: qty 5

## 2018-09-26 MED ORDER — HEPARIN SOD (PORK) LOCK FLUSH 100 UNIT/ML IV SOLN
250.0000 [IU] | Freq: Once | INTRAVENOUS | Status: AC | PRN
  Administered 2018-09-26: 500 [IU]
  Filled 2018-09-26: qty 5

## 2018-09-26 MED ORDER — SODIUM CHLORIDE 0.9 % IV SOLN
Freq: Once | INTRAVENOUS | Status: AC
  Administered 2018-09-26: 09:00:00 via INTRAVENOUS
  Filled 2018-09-26: qty 250

## 2018-09-26 MED ORDER — SODIUM CHLORIDE 0.9% FLUSH
3.0000 mL | Freq: Once | INTRAVENOUS | Status: DC | PRN
  Filled 2018-09-26: qty 10

## 2018-09-26 MED ORDER — DEXAMETHASONE SODIUM PHOSPHATE 10 MG/ML IJ SOLN
10.0000 mg | Freq: Once | INTRAMUSCULAR | Status: AC
  Administered 2018-09-26: 09:00:00 10 mg via INTRAVENOUS

## 2018-09-26 MED ORDER — PALONOSETRON HCL INJECTION 0.25 MG/5ML
0.2500 mg | Freq: Once | INTRAVENOUS | Status: AC
  Administered 2018-09-26: 0.25 mg via INTRAVENOUS

## 2018-09-26 MED ORDER — SODIUM CHLORIDE 0.9 % IV SOLN
60.0000 mg | Freq: Once | INTRAVENOUS | Status: AC
  Administered 2018-09-26: 60 mg via INTRAVENOUS
  Filled 2018-09-26: qty 10

## 2018-09-26 MED ORDER — HEPARIN SOD (PORK) LOCK FLUSH 100 UNIT/ML IV SOLN
500.0000 [IU] | Freq: Once | INTRAVENOUS | Status: AC | PRN
  Administered 2018-09-26: 500 [IU]
  Filled 2018-09-26: qty 5

## 2018-09-26 MED ORDER — HYDROMORPHONE HCL 1 MG/ML IJ SOLN
2.0000 mg | Freq: Once | INTRAMUSCULAR | Status: AC
  Administered 2018-09-26: 2 mg via INTRAVENOUS

## 2018-09-26 MED ORDER — DEXAMETHASONE SODIUM PHOSPHATE 10 MG/ML IJ SOLN
INTRAMUSCULAR | Status: AC
  Filled 2018-09-26: qty 1

## 2018-09-26 NOTE — Progress Notes (Signed)
Reviewed all labwork with Laverna Peace NP.  Ok to treat today.  No additional orders received  Ok to give Pamidronate today per Laverna Peace NP

## 2018-09-26 NOTE — Progress Notes (Signed)
Hematology and Oncology Follow Up Visit  [name] [id number] Feb 19, 1951 68 y.o. 09/26/2018   Principle Diagnosis:  IgG kappa myeloma Anemia secondary to renal insufficiency Intermittent iron - deficiency anemia Hypotestosteronemia  Current Therapy:   Aredia 60 mg IV q 3 months-- next dose 09/2018 Aranesp 300 mcg subcu as needed for hemoglobin less than 11 DepoTestosterone400 mg q4weeks IV iron as indicated Kyprolis/Cytoxan - s/p cycle19   Interim History:  [name] is here today for follow-up. He is still having a lot of generalized joint aches and pains.  He has not been resting well and is a little fatigued today.  No M-spike observed in July, IgG level was 880 mg/dL and kappa light chains 4.01 mg/dL.  No fever, chills, n/v, cough, rash, dizziness, SOB, chest pain, palpitations, abdominal pain or changes in bowel or bladder habits.  He has swelling in both legs with a fluid blister on each leg. He saw his urologist, Dr. Neta Ehlers last week and he has reduced his Amlodipine dose. He is taking his Torsemide as prescribed and notes that the swelling is better when he is not on his feet for an extended period of time.  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  His appetite is good and he is staying well hydrated. His weight is stable.   ECOG Performance Status: 1 - Symptomatic but completely ambulatory  Medications:  Allergies as of 09/26/2018      Reactions   Iodinated Diagnostic Agents Rash   Patient states he was instructed not to take IV contrast.  In 2008 he had an unknown reaction, and was told not to take it again.  He was also told not to take it due to his kidneys.   Iodine Anxiety, Rash, Other (See Comments)   Didn't feel right "instructed not to take per MD--something with his port"      Medication List       Accurate as of September 26, 2018  8:37 AM. If you have any questions, ask your nurse or doctor.        amLODipine 10 MG  tablet Commonly known as: NORVASC Take 10 mg by mouth daily.   atorvastatin 40 MG tablet Commonly known as: LIPITOR daily.   Carafate 1 GM/10ML suspension Generic drug: sucralfate Take 1 g by mouth 4 (four) times daily.   Clear Eyes All Seasons 5-6 MG/ML Soln Generic drug: Polyvinyl Alcohol-Povidone Place 2 drops into both eyes 3 (three) times daily as needed (drye eyes.).   cyclobenzaprine 10 MG tablet Commonly known as: FLEXERIL Take 10 mg by mouth 3 (three) times daily.   dicyclomine 10 MG capsule Commonly known as: BENTYL TAKE 1 CAPSULE BY MOUTH THREE TIMES A DAY   dronabinol 2.5 MG capsule Commonly known as: MARINOL Take 1 capsule (2.5 mg total) by mouth 2 (two) times daily before a meal.   Embeda 30-1.2 MG Cpcr Generic drug: Morphine-Naltrexone Take 3 (three) times daily by mouth.   Embeda 20-0.8 MG Cpcr Generic drug: Morphine-Naltrexone Take 1 capsule by mouth 3 (three) times daily.   furosemide 40 MG tablet Commonly known as: LASIX Take 40 mg by mouth daily.   gabapentin 300 MG capsule Commonly known as: NEURONTIN TAKE 1 CAPSULE BY MOUTH THREE TIMES A DAY   glipiZIDE 5 MG tablet Commonly known as: GLUCOTROL Take 5 mg by mouth 2 (two) times daily before a meal.   HYDROcodone-acetaminophen 5-325 MG tablet Commonly known as: Norco Take 1-2  tablets by mouth every 6 (six) hours as needed. What changed: reasons to take this   lactulose 10 GM/15ML solution Commonly known as: CHRONULAC Take 20 g by mouth daily.   latanoprost 0.005 % ophthalmic solution Commonly known as: XALATAN Place 1 drop into the right eye every evening.   lidocaine-prilocaine cream Commonly known as: EMLA Apply 1 application topically as needed.   linaclotide 145 MCG Caps capsule Commonly known as: LINZESS Take 1 capsule (145 mcg total) by mouth daily before breakfast. What changed:   when to take this  reasons to take this   LORazepam 0.5 MG tablet Commonly known as:  Ativan Take 1 tablet (0.5 mg total) by mouth every 6 (six) hours as needed (Nausea or vomiting).   losartan 25 MG tablet Commonly known as: COZAAR Take 25 mg by mouth.   mesalamine 1.2 g EC tablet Commonly known as: Lialda Take 1 tablet (1.2 g total) by mouth daily with breakfast.   metoprolol succinate 100 MG 24 hr tablet Commonly known as: TOPROL-XL Take 100 mg by mouth daily.   mirabegron ER 50 MG Tb24 tablet Commonly known as: MYRBETRIQ Take 50 mg by mouth daily.   morphine 30 MG 12 hr tablet Commonly known as: MS CONTIN Take 30 mg by mouth 2 (two) times daily.   omeprazole 40 MG capsule Commonly known as: PRILOSEC TAKE 1 CAPSULE BY MOUTH TWICE A DAY   ondansetron 8 MG tablet Commonly known as: ZOFRAN TAKE 1 TABLET(8 MG) BY MOUTH TWICE DAILY AS NEEDED FOR NAUSEA OR VOMITING What changed: See the new instructions.   orphenadrine 100 MG tablet Commonly known as: NORFLEX Take 1 tablet (100 mg total) by mouth 2 (two) times daily.   oxyCODONE-acetaminophen 10-325 MG tablet Commonly known as: PERCOCET Take 1 tablet by mouth every 6 (six) hours as needed for pain.   pioglitazone 30 MG tablet Commonly known as: ACTOS Take 30 mg by mouth daily.   potassium chloride SA 20 MEQ tablet Commonly known as: K-DUR Take 1 tablet (20 mEq total) by mouth 2 (two) times daily.   prochlorperazine 10 MG tablet Commonly known as: COMPAZINE take 1 tablet by mouth every 6 hours if needed for nausea and vomiting What changed:   how much to take  how to take this  when to take this  reasons to take this   Relistor 150 MG Tabs Generic drug: Methylnaltrexone Bromide Take 150 mg by mouth.   Sennosides 25 MG Tabs Take 25 mg by mouth daily as needed (constipation).   Vitamin D (Ergocalciferol) 1.25 MG (50000 UT) Caps capsule Commonly known as: DRISDOL Take 50,000 Units by mouth every Saturday.   Xtampza ER 13.5 MG C12a Generic drug: oxyCODONE ER Take 1 capsule by mouth 2  (two) times daily.   Zioptan 0.0015 % Soln Generic drug: Tafluprost (PF) Place 1 drop into both eyes daily.       Allergies:  Allergies  Allergen Reactions  . Iodinated Diagnostic Agents Rash    Patient states he was instructed not to take IV contrast.  In 2008 he had an unknown reaction, and was told not to take it again.  He was also told not to take it due to his kidneys.  . Iodine Anxiety, Rash and Other (See Comments)    Didn't feel right "instructed not to take per MD--something with his port"     Past Medical History, Surgical history, Social history, and Family History were reviewed and updated.  Review of Systems:  All other 10 point review of systems is negative.   Physical Exam:  weight is 202 lb (91.6 kg). His oral temperature is 99.3 F (37.4 C). His blood pressure is 175/75 (abnormal) and his pulse is 85. His respiration is 17 and oxygen saturation is 100%.   Wt Readings from Last 3 Encounters:  09/26/18 202 lb (91.6 kg)  09/12/18 199 lb (90.3 kg)  08/29/18 196 lb 13.9 oz (89.3 kg)    Ocular: Sclerae unicteric, pupils equal, round and reactive to light Ear-nose-throat: Oropharynx clear, dentition fair Lymphatic: No cervical or supraclavicular adenopathy Lungs no rales or rhonchi, good excursion bilaterally Heart regular rate and rhythm, no murmur appreciated Abd soft, nontender, positive bowel sounds, no liver or spleen tip palpated on exam, no fluid wave  MSK no focal spinal tenderness, no joint edema Neuro: non-focal, well-oriented, appropriate affect Breasts: Deferred   Lab Results  Component Value Date   WBC 5.7 09/26/2018   HGB 10.5 (L) 09/26/2018   HCT 33.2 (L) 09/26/2018   MCV 84.5 09/26/2018   PLT 160 09/26/2018   Lab Results  Component Value Date   FERRITIN 2,010 (H) 09/12/2018   IRON 100 09/12/2018   TIBC 244 09/12/2018   UIBC 144 09/12/2018   IRONPCTSAT 41 09/12/2018   Lab Results  Component Value Date   RETICCTPCT 0.6 07/25/2018    RBC 3.93 (L) 09/26/2018   RETICCTABS 27.5 03/21/2014   Lab Results  Component Value Date   KPAFRELGTCHN 40.1 (H) 09/12/2018   LAMBDASER 22.0 09/12/2018   KAPLAMBRATIO 1.82 (H) 09/12/2018   Lab Results  Component Value Date   IGGSERUM 880 09/12/2018   IGA 160 09/12/2018   IGMSERUM 28 09/12/2018   Lab Results  Component Value Date   TOTALPROTELP 6.5 09/12/2018   ALBUMINELP 4.0 09/12/2018   A1GS 0.2 09/12/2018   A2GS 0.9 09/12/2018   BETS 0.7 09/12/2018   BETA2SER 0.5 11/07/2014   GAMS 0.7 09/12/2018   MSPIKE Not Observed 09/12/2018   SPEI Comment 09/12/2018     Chemistry      Component Value Date/Time   NA 142 09/12/2018 0825   NA 143 02/22/2017 0744   K 3.0 (LL) 09/12/2018 0825   K 4.0 02/22/2017 0744   CL 102 09/12/2018 0825   CL 106 02/22/2017 0744   CO2 30 09/12/2018 0825   CO2 27 02/22/2017 0744   BUN 24 (H) 09/12/2018 0825   BUN 26 (H) 02/22/2017 0744   CREATININE 2.07 (H) 09/12/2018 0825   CREATININE 2.3 (H) 02/22/2017 0744      Component Value Date/Time   CALCIUM 7.8 (L) 09/12/2018 0825   CALCIUM 9.3 02/22/2017 0744   ALKPHOS 83 09/12/2018 0825   ALKPHOS 80 02/22/2017 0744   AST 39 09/12/2018 0825   ALT 51 (H) 09/12/2018 0825   ALT 22 02/22/2017 0744   BILITOT 0.4 09/12/2018 0825       Impression and Plan: Mr. Felten is a very pleasant 68 yo African American gentleman with IgG kappa myeloma. He has tolerated treatment well and his counts have remained stable.  We will proceed with treatment today as planned.  Calcium today is improved at 9.0 so he is able to get Aredia.  We will see him back in another 2 weeks.  He will contact our office with any questions or concerns. We can certainly see him sooner if needed.   Laverna Peace, NP 8/3/20208:37 AM

## 2018-09-26 NOTE — Patient Instructions (Signed)
Implanted Port Insertion, Care After This sheet gives you information about how to care for yourself after your procedure. Your health care provider may also give you more specific instructions. If you have problems or questions, contact your health care provider. What can I expect after the procedure? After the procedure, it is common to have:  Discomfort at the port insertion site.  Bruising on the skin over the port. This should improve over 3-4 days. Follow these instructions at home: Port care  After your port is placed, you will get a manufacturer's information card. The card has information about your port. Keep this card with you at all times.  Take care of the port as told by your health care provider. Ask your health care provider if you or a family member can get training for taking care of the port at home. A home health care nurse may also take care of the port.  Make sure to remember what type of port you have. Incision care      Follow instructions from your health care provider about how to take care of your port insertion site. Make sure you: ? Wash your hands with soap and water before and after you change your bandage (dressing). If soap and water are not available, use hand sanitizer. ? Change your dressing as told by your health care provider. ? Leave stitches (sutures), skin glue, or adhesive strips in place. These skin closures may need to stay in place for 2 weeks or longer. If adhesive strip edges start to loosen and curl up, you may trim the loose edges. Do not remove adhesive strips completely unless your health care provider tells you to do that.  Check your port insertion site every day for signs of infection. Check for: ? Redness, swelling, or pain. ? Fluid or blood. ? Warmth. ? Pus or a bad smell. Activity  Return to your normal activities as told by your health care provider. Ask your health care provider what activities are safe for you.  Do not  lift anything that is heavier than 10 lb (4.5 kg), or the limit that you are told, until your health care provider says that it is safe. General instructions  Take over-the-counter and prescription medicines only as told by your health care provider.  Do not take baths, swim, or use a hot tub until your health care provider approves. Ask your health care provider if you may take showers. You may only be allowed to take sponge baths.  Do not drive for 24 hours if you were given a sedative during your procedure.  Wear a medical alert bracelet in case of an emergency. This will tell any health care providers that you have a port.  Keep all follow-up visits as told by your health care provider. This is important. Contact a health care provider if:  You cannot flush your port with saline as directed, or you cannot draw blood from the port.  You have a fever or chills.  You have redness, swelling, or pain around your port insertion site.  You have fluid or blood coming from your port insertion site.  Your port insertion site feels warm to the touch.  You have pus or a bad smell coming from the port insertion site. Get help right away if:  You have chest pain or shortness of breath.  You have bleeding from your port that you cannot control. Summary  Take care of the port as told by your health   care provider. Keep the manufacturer's information card with you at all times.  Change your dressing as told by your health care provider.  Contact a health care provider if you have a fever or chills or if you have redness, swelling, or pain around your port insertion site.  Keep all follow-up visits as told by your health care provider. This information is not intended to replace advice given to you by your health care provider. Make sure you discuss any questions you have with your health care provider. Document Released: 11/30/2012 Document Revised: 09/07/2017 Document Reviewed: 09/07/2017  Elsevier Patient Education  2020 [employer]  

## 2018-09-26 NOTE — Patient Instructions (Signed)
[facility] Discharge Instructions for Patients Receiving Chemotherapy  Today you received the following chemotherapy agents Kyprolis, Cytoxan  To help prevent nausea and vomiting after your treatment, we encourage you to take your nausea medication    If you develop nausea and vomiting that is not controlled by your nausea medication, call the clinic.   BELOW ARE SYMPTOMS THAT SHOULD BE REPORTED IMMEDIATELY:  *FEVER GREATER THAN 100.5 F  *CHILLS WITH OR WITHOUT FEVER  NAUSEA AND VOMITING THAT IS NOT CONTROLLED WITH YOUR NAUSEA MEDICATION  *UNUSUAL SHORTNESS OF BREATH  *UNUSUAL BRUISING OR BLEEDING  TENDERNESS IN MOUTH AND THROAT WITH OR WITHOUT PRESENCE OF ULCERS  *URINARY PROBLEMS  *BOWEL PROBLEMS  UNUSUAL RASH Items with * indicate a potential emergency and should be followed up as soon as possible.  Feel free to call the clinic should you have any questions or concerns. The clinic phone number is [phone].  Please show the [facility] at check-in to the Emergency Department and triage nurse.  Pamidronate injection What is this medicine? PAMIDRONATE (pa mi DROE nate) slows calcium loss from bones. It is used to treat high calcium blood levels from cancer or Paget's disease. It is also used to treat bone pain and prevent fractures from certain cancers that have spread to the bone. This medicine may be used for other purposes; ask your health care provider or pharmacist if you have questions. COMMON BRAND NAME(S): Aredia What should I tell my health care provider before I take this medicine? They need to know if you have any of these conditions:  aspirin-sensitive asthma  dental disease  kidney disease  an unusual or allergic reaction to pamidronate, other medicines, foods, dyes, or preservatives  pregnant or trying to get pregnant  breast-feeding How should I use this medicine? This medicine is for infusion into a vein. It is given by a  health care professional in a hospital or clinic setting. Talk to your pediatrician regarding the use of this medicine in children. This medicine is not approved for use in children. Overdosage: If you think you have taken too much of this medicine contact a poison control center or emergency room at once. NOTE: This medicine is only for you. Do not share this medicine with others. What if I miss a dose? This does not apply. What may interact with this medicine?  certain antibiotics given by injection  medicines for inflammation or pain like ibuprofen, naproxen  some diuretics like bumetanide, furosemide  cyclosporine  parathyroid hormone  tacrolimus  teriparatide  thalidomide This list may not describe all possible interactions. Give your health care provider a list of all the medicines, herbs, non-prescription drugs, or dietary supplements you use. Also tell them if you smoke, drink alcohol, or use illegal drugs. Some items may interact with your medicine. What should I watch for while using this medicine? Visit your doctor or health care professional for regular checkups. It may be some time before you see the benefit from this medicine. Do not stop taking your medicine unless your doctor tells you to. Your doctor may order blood tests or other tests to see how you are doing. Women should inform their doctor if they wish to become pregnant or think they might be pregnant. There is a potential for serious side effects to an unborn child. Talk to your health care professional or pharmacist for more information. You should make sure that you get enough calcium and vitamin D while you are  taking this medicine. Discuss the foods you eat and the vitamins you take with your health care professional. Some people who take this medicine have severe bone, joint, and/or muscle pain. This medicine may also increase your risk for a broken thigh bone. Tell your doctor right away if you have pain in  your upper leg or groin. Tell your doctor if you have any pain that does not go away or that gets worse. What side effects may I notice from receiving this medicine? Side effects that you should report to your doctor or health care professional as soon as possible:  allergic reactions like skin rash, itching or hives, swelling of the face, lips, or tongue  black or tarry stools  changes in vision  eye inflammation, pain  high blood pressure  jaw pain, especially burning or cramping  muscle weakness  numb, tingling pain  swelling of feet or hands  trouble passing urine or change in the amount of urine  unable to move easily Side effects that usually do not require medical attention (report to your doctor or health care professional if they continue or are bothersome):  bone, joint, or muscle pain  constipation  dizzy, drowsy  fever  headache  loss of appetite  nausea, vomiting  pain at site where injected This list may not describe all possible side effects. Call your doctor for medical advice about side effects. You may report side effects to FDA at 1-800-FDA-1088. Where should I keep my medicine? This drug is given in a hospital or clinic and will not be stored at home. NOTE: This sheet is a summary. It may not cover all possible information. If you have questions about this medicine, talk to your doctor, pharmacist, or health care provider.  2020 [employer]vier/Gold Standard (2010-08-08 08:49:49)

## 2018-09-27 LAB — KAPPA/LAMBDA LIGHT CHAINS
Kappa free light chain: 37.5 mg/L — ABNORMAL HIGH (ref 3.3–19.4)
Kappa, lambda light chain ratio: 1.85 — ABNORMAL HIGH (ref 0.26–1.65)
Lambda free light chains: 20.3 mg/L (ref 5.7–26.3)

## 2018-09-27 LAB — IGG, IGA, IGM
IgA: 175 mg/dL (ref 61–437)
IgG (Immunoglobin G), Serum: 987 mg/dL (ref 603–1613)
IgM (Immunoglobulin M), Srm: 26 mg/dL (ref 20–172)

## 2018-09-27 LAB — TESTOSTERONE: Testosterone: 1232 ng/dL — ABNORMAL HIGH (ref 264–916)

## 2018-09-28 LAB — PROTEIN ELECTROPHORESIS, SERUM, WITH REFLEX
A/G Ratio: 1.3 (ref 0.7–1.7)
Albumin ELP: 3.8 g/dL (ref 2.9–4.4)
Alpha-1-Globulin: 0.2 g/dL (ref 0.0–0.4)
Alpha-2-Globulin: 1 g/dL (ref 0.4–1.0)
Beta Globulin: 0.8 g/dL (ref 0.7–1.3)
Gamma Globulin: 0.9 g/dL (ref 0.4–1.8)
Globulin, Total: 2.9 g/dL (ref 2.2–3.9)
Total Protein ELP: 6.7 g/dL (ref 6.0–8.5)

## 2018-09-29 ENCOUNTER — Inpatient Hospital Stay: Payer: Medicare Other

## 2018-09-29 ENCOUNTER — Other Ambulatory Visit: Payer: Self-pay | Admitting: Family

## 2018-09-29 ENCOUNTER — Telehealth: Payer: Self-pay | Admitting: *Deleted

## 2018-09-29 ENCOUNTER — Other Ambulatory Visit: Payer: Self-pay

## 2018-09-29 DIAGNOSIS — C9000 Multiple myeloma not having achieved remission: Secondary | ICD-10-CM

## 2018-09-29 DIAGNOSIS — Z5112 Encounter for antineoplastic immunotherapy: Secondary | ICD-10-CM | POA: Diagnosis not present

## 2018-09-29 DIAGNOSIS — D631 Anemia in chronic kidney disease: Secondary | ICD-10-CM

## 2018-09-29 LAB — CBC WITH DIFFERENTIAL (CANCER CENTER ONLY)
Abs Immature Granulocytes: 0.03 10*3/uL (ref 0.00–0.07)
Basophils Absolute: 0 10*3/uL (ref 0.0–0.1)
Basophils Relative: 0 %
Eosinophils Absolute: 0.2 10*3/uL (ref 0.0–0.5)
Eosinophils Relative: 3 %
HCT: 33.2 % — ABNORMAL LOW (ref 39.0–52.0)
Hemoglobin: 10.3 g/dL — ABNORMAL LOW (ref 13.0–17.0)
Immature Granulocytes: 1 %
Lymphocytes Relative: 19 %
Lymphs Abs: 1 10*3/uL (ref 0.7–4.0)
MCH: 26.2 pg (ref 26.0–34.0)
MCHC: 31 g/dL (ref 30.0–36.0)
MCV: 84.5 fL (ref 80.0–100.0)
Monocytes Absolute: 0.8 10*3/uL (ref 0.1–1.0)
Monocytes Relative: 16 %
Neutro Abs: 3.3 10*3/uL (ref 1.7–7.7)
Neutrophils Relative %: 61 %
Platelet Count: 144 10*3/uL — ABNORMAL LOW (ref 150–400)
RBC: 3.93 MIL/uL — ABNORMAL LOW (ref 4.22–5.81)
RDW: 16.5 % — ABNORMAL HIGH (ref 11.5–15.5)
WBC Count: 5.3 10*3/uL (ref 4.0–10.5)
nRBC: 0 % (ref 0.0–0.2)

## 2018-09-29 LAB — CMP (CANCER CENTER ONLY)
ALT: 24 U/L (ref 0–44)
AST: 34 U/L (ref 15–41)
Albumin: 3.8 g/dL (ref 3.5–5.0)
Alkaline Phosphatase: 65 U/L (ref 38–126)
Anion gap: 7 (ref 5–15)
BUN: 12 mg/dL (ref 8–23)
CO2: 31 mmol/L (ref 22–32)
Calcium: 8.4 mg/dL — ABNORMAL LOW (ref 8.9–10.3)
Chloride: 105 mmol/L (ref 98–111)
Creatinine: 2.05 mg/dL — ABNORMAL HIGH (ref 0.61–1.24)
GFR, Est AFR Am: 37 mL/min — ABNORMAL LOW (ref >60)
GFR, Estimated: 32 mL/min — ABNORMAL LOW (ref >60)
Glucose, Bld: 104 mg/dL — ABNORMAL HIGH (ref 70–99)
Potassium: 3.8 mmol/L (ref 3.5–5.1)
Sodium: 143 mmol/L (ref 135–145)
Total Bilirubin: 0.5 mg/dL (ref 0.3–1.2)
Total Protein: 6.2 g/dL — ABNORMAL LOW (ref 6.5–8.1)

## 2018-09-29 NOTE — Telephone Encounter (Signed)
Message received from patient stating that he is "freezing" and would like a call back from Dr. [name].  Call placed back to patient and patient states that he has been freezing for 2 days, but has checked and he does not have a fever.  He also states that he is having increased weakness.  He denies any SOB, cough, headache, dizziness or any other difficulty.  Dr. [name] notified and call placed back to patient to notify him that Dr. [name] would like for patient to come in today for labs and to see Sarah. Patient agreeable with plan. Patient transferred to scheduling.

## 2018-09-29 NOTE — Telephone Encounter (Signed)
Patient notified per order of S. [place] NP that lab results look ok and to contact his PCP regarding chills and weakness.  Pt had a 100 degree temperature on arrival to the cancer center today.  Pt states that he will contact his PCP now.  Today's labs routed to Dr. Jonelle Sidle per pt.'s request.

## 2018-09-30 ENCOUNTER — Telehealth: Payer: Self-pay | Admitting: *Deleted

## 2018-09-30 NOTE — Telephone Encounter (Signed)
Call placed to patient to check on his status and voicemail received.  Message left for patient to call this office back to update Dr. [name] on his status.

## 2018-10-10 ENCOUNTER — Telehealth: Payer: Self-pay | Admitting: Family

## 2018-10-10 ENCOUNTER — Inpatient Hospital Stay: Payer: Medicare Other

## 2018-10-10 ENCOUNTER — Encounter: Payer: Self-pay | Admitting: Family

## 2018-10-10 ENCOUNTER — Inpatient Hospital Stay (HOSPITAL_BASED_OUTPATIENT_CLINIC_OR_DEPARTMENT_OTHER): Payer: Medicare Other | Admitting: Family

## 2018-10-10 ENCOUNTER — Other Ambulatory Visit: Payer: Self-pay

## 2018-10-10 VITALS — BP 145/80 | HR 72 | Temp 98.7°F | Resp 18 | Ht 71.0 in | Wt 199.1 lb

## 2018-10-10 DIAGNOSIS — N189 Chronic kidney disease, unspecified: Secondary | ICD-10-CM

## 2018-10-10 DIAGNOSIS — D631 Anemia in chronic kidney disease: Secondary | ICD-10-CM

## 2018-10-10 DIAGNOSIS — D508 Other iron deficiency anemias: Secondary | ICD-10-CM

## 2018-10-10 DIAGNOSIS — C9000 Multiple myeloma not having achieved remission: Secondary | ICD-10-CM

## 2018-10-10 DIAGNOSIS — M4712 Other spondylosis with myelopathy, cervical region: Secondary | ICD-10-CM

## 2018-10-10 DIAGNOSIS — E349 Endocrine disorder, unspecified: Secondary | ICD-10-CM

## 2018-10-10 DIAGNOSIS — Z299 Encounter for prophylactic measures, unspecified: Secondary | ICD-10-CM

## 2018-10-10 DIAGNOSIS — Z5112 Encounter for antineoplastic immunotherapy: Secondary | ICD-10-CM | POA: Diagnosis not present

## 2018-10-10 LAB — CMP (CANCER CENTER ONLY)
ALT: 17 U/L (ref 0–44)
AST: 21 U/L (ref 15–41)
Albumin: 4 g/dL (ref 3.5–5.0)
Alkaline Phosphatase: 65 U/L (ref 38–126)
Anion gap: 8 (ref 5–15)
BUN: 18 mg/dL (ref 8–23)
CO2: 31 mmol/L (ref 22–32)
Calcium: 9.1 mg/dL (ref 8.9–10.3)
Chloride: 104 mmol/L (ref 98–111)
Creatinine: 1.86 mg/dL — ABNORMAL HIGH (ref 0.61–1.24)
GFR, Est AFR Am: 42 mL/min — ABNORMAL LOW (ref >60)
GFR, Estimated: 36 mL/min — ABNORMAL LOW (ref >60)
Glucose, Bld: 63 mg/dL — ABNORMAL LOW (ref 70–99)
Potassium: 3.7 mmol/L (ref 3.5–5.1)
Sodium: 143 mmol/L (ref 135–145)
Total Bilirubin: 0.4 mg/dL (ref 0.3–1.2)
Total Protein: 6.5 g/dL (ref 6.5–8.1)

## 2018-10-10 LAB — CBC WITH DIFFERENTIAL (CANCER CENTER ONLY)
Abs Immature Granulocytes: 0.02 10*3/uL (ref 0.00–0.07)
Basophils Absolute: 0 10*3/uL (ref 0.0–0.1)
Basophils Relative: 1 %
Eosinophils Absolute: 0.1 10*3/uL (ref 0.0–0.5)
Eosinophils Relative: 3 %
HCT: 32.6 % — ABNORMAL LOW (ref 39.0–52.0)
Hemoglobin: 10.1 g/dL — ABNORMAL LOW (ref 13.0–17.0)
Immature Granulocytes: 0 %
Lymphocytes Relative: 16 %
Lymphs Abs: 0.8 10*3/uL (ref 0.7–4.0)
MCH: 26.2 pg (ref 26.0–34.0)
MCHC: 31 g/dL (ref 30.0–36.0)
MCV: 84.5 fL (ref 80.0–100.0)
Monocytes Absolute: 0.5 10*3/uL (ref 0.1–1.0)
Monocytes Relative: 11 %
Neutro Abs: 3.3 10*3/uL (ref 1.7–7.7)
Neutrophils Relative %: 69 %
Platelet Count: 157 10*3/uL (ref 150–400)
RBC: 3.86 MIL/uL — ABNORMAL LOW (ref 4.22–5.81)
RDW: 16.1 % — ABNORMAL HIGH (ref 11.5–15.5)
WBC Count: 4.8 10*3/uL (ref 4.0–10.5)
nRBC: 0 % (ref 0.0–0.2)

## 2018-10-10 LAB — LACTATE DEHYDROGENASE: LDH: 259 U/L — ABNORMAL HIGH (ref 98–192)

## 2018-10-10 MED ORDER — SODIUM CHLORIDE 0.9 % IV SOLN
Freq: Once | INTRAVENOUS | Status: AC
  Administered 2018-10-10: 09:00:00 via INTRAVENOUS
  Filled 2018-10-10: qty 250

## 2018-10-10 MED ORDER — SODIUM CHLORIDE 0.9 % IV SOLN
300.0000 mg/m2 | Freq: Once | INTRAVENOUS | Status: AC
  Administered 2018-10-10: 620 mg via INTRAVENOUS
  Filled 2018-10-10: qty 31

## 2018-10-10 MED ORDER — DEXAMETHASONE SODIUM PHOSPHATE 10 MG/ML IJ SOLN
INTRAMUSCULAR | Status: AC
  Filled 2018-10-10: qty 1

## 2018-10-10 MED ORDER — PALONOSETRON HCL INJECTION 0.25 MG/5ML
INTRAVENOUS | Status: AC
  Filled 2018-10-10: qty 5

## 2018-10-10 MED ORDER — DEXTROSE 5 % IV SOLN
29.0000 mg/m2 | Freq: Once | INTRAVENOUS | Status: AC
  Administered 2018-10-10: 60 mg via INTRAVENOUS
  Filled 2018-10-10: qty 30

## 2018-10-10 MED ORDER — HYDROMORPHONE HCL 1 MG/ML IJ SOLN
INTRAMUSCULAR | Status: AC
  Filled 2018-10-10: qty 2

## 2018-10-10 MED ORDER — DARBEPOETIN ALFA 300 MCG/0.6ML IJ SOSY
PREFILLED_SYRINGE | INTRAMUSCULAR | Status: AC
  Filled 2018-10-10: qty 0.6

## 2018-10-10 MED ORDER — DEXAMETHASONE SODIUM PHOSPHATE 10 MG/ML IJ SOLN
10.0000 mg | Freq: Once | INTRAMUSCULAR | Status: AC
  Administered 2018-10-10: 10 mg via INTRAVENOUS

## 2018-10-10 MED ORDER — DARBEPOETIN ALFA 300 MCG/0.6ML IJ SOSY
300.0000 ug | PREFILLED_SYRINGE | Freq: Once | INTRAMUSCULAR | Status: AC
  Administered 2018-10-10: 300 ug via SUBCUTANEOUS

## 2018-10-10 MED ORDER — HEPARIN SOD (PORK) LOCK FLUSH 100 UNIT/ML IV SOLN
500.0000 [IU] | Freq: Once | INTRAVENOUS | Status: AC | PRN
  Administered 2018-10-10: 500 [IU]
  Filled 2018-10-10: qty 5

## 2018-10-10 MED ORDER — HYDROMORPHONE HCL 1 MG/ML IJ SOLN
2.0000 mg | Freq: Once | INTRAMUSCULAR | Status: AC
  Administered 2018-10-10: 2 mg via INTRAVENOUS

## 2018-10-10 MED ORDER — SODIUM CHLORIDE 0.9 % IV SOLN
Freq: Once | INTRAVENOUS | Status: DC
  Filled 2018-10-10: qty 250

## 2018-10-10 MED ORDER — PALONOSETRON HCL INJECTION 0.25 MG/5ML
0.2500 mg | Freq: Once | INTRAVENOUS | Status: AC
  Administered 2018-10-10: 0.25 mg via INTRAVENOUS

## 2018-10-10 MED ORDER — SODIUM CHLORIDE 0.9% FLUSH
10.0000 mL | INTRAVENOUS | Status: DC | PRN
  Administered 2018-10-10: 10 mL
  Filled 2018-10-10: qty 10

## 2018-10-10 NOTE — Telephone Encounter (Signed)
Has his appointment with Dr. [name] on 8/31 :) per 8/17 los

## 2018-10-10 NOTE — Progress Notes (Signed)
Okay to treat with SCr = 1.86 per Laverna Peace, NP

## 2018-10-10 NOTE — Progress Notes (Signed)
Hematology and Oncology Follow Up Visit  Nashon Erbes [id number] March 04, 1950 68 y.o. 10/10/2018   Principle Diagnosis:  IgG kappa myeloma  Anemia secondary to renal insufficiency  Intermittent iron - deficiency anemia  Hypotestosteronemia   Current Therapy:   Aredia 60 mg IV q 3 months-- next dose 09/2018 Aranesp 300 mcg subcu as needed for hemoglobin less than 11 DepoTestosterone400 mg q4weeks IV iron as indicated Kyprolis/Cytoxan - s/p cycle20   Interim History:  Mr. Diehl is here today for follow-up and treatment. He is feeling fatigued this morning and his blood glucose is 63.  Iron studies are stable.  He has not had an episodes of bleeding, no bruising or petechiae.  He has generalized aches and pains in the joints and back. He states that he still gets steroid injections in the back and has improvement with these.  No M-spike observed earlier this month. IgG level 987 and kappa light chains 3.75 mg/dL.  No fever, cough, rash, dizziness, SOB, chest pain, palpitations, abdominal pain or changes in bowel or bladder habits.  No swelling in his extremities at this time.  The neuropathy in his hands and feet is unchanged. No falls or syncopal episodes.  He states that he has not had much of an appetite but is staying well hydrated. His weight is stable.   ECOG Performance Status: 1 - Symptomatic but completely ambulatory  Medications:  Allergies as of 10/10/2018      Reactions   Iodinated Diagnostic Agents Rash   Patient states he was instructed not to take IV contrast.  In 2008 he had an unknown reaction, and was told not to take it again.  He was also told not to take it due to his kidneys.   Iodine Anxiety, Rash, Other (See Comments)   Didn't feel right "instructed not to take per MD--something with his port"      Medication List       Accurate as of October 10, 2018  8:52 AM. If you have any questions, ask your nurse or doctor.        amLODipine 5 MG  tablet Commonly known as: NORVASC Take 5 mg by mouth daily. What changed: Another medication with the same name was removed. Continue taking this medication, and follow the directions you see here. Changed by: Laverna Peace, NP   atorvastatin 40 MG tablet Commonly known as: LIPITOR daily.   Carafate 1 GM/10ML suspension Generic drug: sucralfate Take 1 g by mouth 4 (four) times daily.   Clear Eyes All Seasons 5-6 MG/ML Soln Generic drug: Polyvinyl Alcohol-Povidone Place 2 drops into both eyes 3 (three) times daily as needed (drye eyes.).   cyclobenzaprine 10 MG tablet Commonly known as: FLEXERIL Take 10 mg by mouth 3 (three) times daily.   dicyclomine 10 MG capsule Commonly known as: BENTYL TAKE 1 CAPSULE BY MOUTH THREE TIMES A DAY   dronabinol 2.5 MG capsule Commonly known as: MARINOL Take 1 capsule (2.5 mg total) by mouth 2 (two) times daily before a meal.   Embeda 30-1.2 MG Cpcr Generic drug: Morphine-Naltrexone Take 3 (three) times daily by mouth.   Embeda 20-0.8 MG Cpcr Generic drug: Morphine-Naltrexone Take 1 capsule by mouth 3 (three) times daily.   furosemide 40 MG tablet Commonly known as: LASIX Take 40 mg by mouth daily.   gabapentin 300 MG capsule Commonly known as: NEURONTIN TAKE 1 CAPSULE BY MOUTH THREE TIMES A DAY   glipiZIDE 5 MG tablet Commonly known as: GLUCOTROL Take 5 mg  by mouth 2 (two) times daily before a meal.   HYDROcodone-acetaminophen 5-325 MG tablet Commonly known as: Norco Take 1-2 tablets by mouth every 6 (six) hours as needed. What changed: reasons to take this   lactulose 10 GM/15ML solution Commonly known as: CHRONULAC Take 20 g by mouth daily.   latanoprost 0.005 % ophthalmic solution Commonly known as: XALATAN Place 1 drop into the right eye every evening.   lidocaine-prilocaine cream Commonly known as: EMLA Apply 1 application topically as needed.   linaclotide 145 MCG Caps capsule Commonly known as:  LINZESS Take 1 capsule (145 mcg total) by mouth daily before breakfast. What changed:   when to take this  reasons to take this   LORazepam 0.5 MG tablet Commonly known as: Ativan Take 1 tablet (0.5 mg total) by mouth every 6 (six) hours as needed (Nausea or vomiting).   losartan 25 MG tablet Commonly known as: COZAAR Take 25 mg by mouth.   mesalamine 1.2 g EC tablet Commonly known as: Lialda Take 1 tablet (1.2 g total) by mouth daily with breakfast.   metoprolol succinate 100 MG 24 hr tablet Commonly known as: TOPROL-XL Take 100 mg by mouth daily.   mirabegron ER 50 MG Tb24 tablet Commonly known as: MYRBETRIQ Take 50 mg by mouth daily.   morphine 30 MG 12 hr tablet Commonly known as: MS CONTIN Take 30 mg by mouth 2 (two) times daily.   omeprazole 40 MG capsule Commonly known as: PRILOSEC TAKE 1 CAPSULE BY MOUTH TWICE A DAY   ondansetron 8 MG tablet Commonly known as: ZOFRAN TAKE 1 TABLET(8 MG) BY MOUTH TWICE DAILY AS NEEDED FOR NAUSEA OR VOMITING What changed: See the new instructions.   orphenadrine 100 MG tablet Commonly known as: NORFLEX Take 1 tablet (100 mg total) by mouth 2 (two) times daily.   oxyCODONE-acetaminophen 10-325 MG tablet Commonly known as: PERCOCET Take 1 tablet by mouth every 6 (six) hours as needed for pain.   pioglitazone 30 MG tablet Commonly known as: ACTOS Take 30 mg by mouth daily.   potassium chloride SA 20 MEQ tablet Commonly known as: K-DUR Take 1 tablet (20 mEq total) by mouth 2 (two) times daily.   prochlorperazine 10 MG tablet Commonly known as: COMPAZINE take 1 tablet by mouth every 6 hours if needed for nausea and vomiting What changed:   how much to take  how to take this  when to take this  reasons to take this   Relistor 150 MG Tabs Generic drug: Methylnaltrexone Bromide Take 150 mg by mouth.   Sennosides 25 MG Tabs Take 25 mg by mouth daily as needed (constipation).   Vitamin D (Ergocalciferol) 1.25  MG (50000 UT) Caps capsule Commonly known as: DRISDOL Take 50,000 Units by mouth every Saturday.   Xtampza ER 13.5 MG C12a Generic drug: oxyCODONE ER Take 1 capsule by mouth 2 (two) times daily.   Zioptan 0.0015 % Soln Generic drug: Tafluprost (PF) Place 1 drop into both eyes daily.       Allergies:  Allergies  Allergen Reactions   Iodinated Diagnostic Agents Rash    Patient states he was instructed not to take IV contrast.  In 2008 he had an unknown reaction, and was told not to take it again.  He was also told not to take it due to his kidneys.   Iodine Anxiety, Rash and Other (See Comments)    Didn't feel right "instructed not to take per MD--something with his port"  Past Medical History, Surgical history, Social history, and Family History were reviewed and updated.  Review of Systems: All other 10 point review of systems is negative.   Physical Exam:  height is 5\' 11"  (1.803 m) and weight is 199 lb 1.3 oz (90.3 kg). His oral temperature is 98.7 F (37.1 C). His blood pressure is 145/80 (abnormal) and his pulse is 72. His respiration is 18 and oxygen saturation is 99%.   Wt Readings from Last 3 Encounters:  10/10/18 199 lb 1.3 oz (90.3 kg)  09/26/18 202 lb (91.6 kg)  09/12/18 199 lb (90.3 kg)    Ocular: Sclerae unicteric, pupils equal, round and reactive to light Ear-nose-throat: Oropharynx clear, dentition fair Lymphatic: No cervical or supraclavicular adenopathy Lungs no rales or rhonchi, good excursion bilaterally Heart regular rate and rhythm, no murmur appreciated Abd soft, nontender, positive bowel sounds, no liver or spleen tip palpated on exam, no fluid wave  MSK no focal spinal tenderness, no joint edema Neuro: non-focal, well-oriented, appropriate affect Breasts: Deferred   Lab Results  Component Value Date   WBC 4.8 10/10/2018   HGB 10.1 (L) 10/10/2018   HCT 32.6 (L) 10/10/2018   MCV 84.5 10/10/2018   PLT 157 10/10/2018   Lab Results   Component Value Date   FERRITIN 2,208 (H) 09/26/2018   IRON 72 09/26/2018   TIBC 233 09/26/2018   UIBC 160 09/26/2018   IRONPCTSAT 31 09/26/2018   Lab Results  Component Value Date   RETICCTPCT 0.6 07/25/2018   RBC 3.86 (L) 10/10/2018   RETICCTABS 27.5 03/21/2014   Lab Results  Component Value Date   KPAFRELGTCHN 37.5 (H) 09/26/2018   LAMBDASER 20.3 09/26/2018   KAPLAMBRATIO 1.85 (H) 09/26/2018   Lab Results  Component Value Date   IGGSERUM 987 09/26/2018   IGA 175 09/26/2018   IGMSERUM 26 09/26/2018   Lab Results  Component Value Date   TOTALPROTELP 6.7 09/26/2018   ALBUMINELP 3.8 09/26/2018   A1GS 0.2 09/26/2018   A2GS 1.0 09/26/2018   BETS 0.8 09/26/2018   BETA2SER 0.5 11/07/2014   GAMS 0.9 09/26/2018   MSPIKE Not Observed 09/26/2018   SPEI Comment 09/12/2018     Chemistry      Component Value Date/Time   NA 143 09/29/2018 1157   NA 143 02/22/2017 0744   K 3.8 09/29/2018 1157   K 4.0 02/22/2017 0744   CL 105 09/29/2018 1157   CL 106 02/22/2017 0744   CO2 31 09/29/2018 1157   CO2 27 02/22/2017 0744   BUN 12 09/29/2018 1157   BUN 26 (H) 02/22/2017 0744   CREATININE 2.05 (H) 09/29/2018 1157   CREATININE 2.3 (H) 02/22/2017 0744      Component Value Date/Time   CALCIUM 8.4 (L) 09/29/2018 1157   CALCIUM 9.3 02/22/2017 0744   ALKPHOS 65 09/29/2018 1157   ALKPHOS 80 02/22/2017 0744   AST 34 09/29/2018 1157   ALT 24 09/29/2018 1157   ALT 22 02/22/2017 0744   BILITOT 0.5 09/29/2018 1157       Impression and Plan: Mr. Stolp is a very pleasant 68 yo African American gentleman with IgG kappa myeloma.  He hadn't had breakfast and, after receiving a snack, is feeling better at this time.  We will proceed with treatment as planned.  He also received Aranesp for Hgb 10.1.  We will see him back in another 2 weeks.  He will contact our office with any questions or concerns. We can certainly see him sooner  if needed.   Laverna Peace, NP 8/17/20208:52  AM

## 2018-10-10 NOTE — Patient Instructions (Signed)

## 2018-10-10 NOTE — Patient Instructions (Signed)
Darbepoetin Alfa injection What is this medicine? DARBEPOETIN ALFA (dar be POE e tin AL fa) helps your body make more red blood cells. It is used to treat anemia caused by chronic kidney failure and chemotherapy. This medicine may be used for other purposes; ask your health care provider or pharmacist if you have questions. COMMON BRAND NAME(S): Aranesp What should I tell my health care provider before I take this medicine? They need to know if you have any of these conditions:  blood clotting disorders or history of blood clots  cancer patient not on chemotherapy  cystic fibrosis  heart disease, such as angina, heart failure, or a history of a heart attack  hemoglobin level of 12 g/dL or greater  high blood pressure  low levels of folate, iron, or vitamin B12  seizures  an unusual or allergic reaction to darbepoetin, erythropoietin, albumin, hamster proteins, latex, other medicines, foods, dyes, or preservatives  pregnant or trying to get pregnant  breast-feeding How should I use this medicine? This medicine is for injection into a vein or under the skin. It is usually given by a health care professional in a hospital or clinic setting. If you get this medicine at home, you will be taught how to prepare and give this medicine. Use exactly as directed. Take your medicine at regular intervals. Do not take your medicine more often than directed. It is important that you put your used needles and syringes in a special sharps container. Do not put them in a trash can. If you do not have a sharps container, call your pharmacist or healthcare provider to get one. A special MedGuide will be given to you by the pharmacist with each prescription and refill. Be sure to read this information carefully each time. Talk to your pediatrician regarding the use of this medicine in children. While this medicine may be used in children as young as 1 month of age for selected conditions, precautions do  apply. Overdosage: If you think you have taken too much of this medicine contact a poison control center or emergency room at once. NOTE: This medicine is only for you. Do not share this medicine with others. What if I miss a dose? If you miss a dose, take it as soon as you can. If it is almost time for your next dose, take only that dose. Do not take double or extra doses. What may interact with this medicine? Do not take this medicine with any of the following medications:  epoetin alfa This list may not describe all possible interactions. Give your health care provider a list of all the medicines, herbs, non-prescription drugs, or dietary supplements you use. Also tell them if you smoke, drink alcohol, or use illegal drugs. Some items may interact with your medicine. What should I watch for while using this medicine? Your condition will be monitored carefully while you are receiving this medicine. You may need blood work done while you are taking this medicine. This medicine may cause a decrease in vitamin B6. You should make sure that you get enough vitamin B6 while you are taking this medicine. Discuss the foods you eat and the vitamins you take with your health care professional. What side effects may I notice from receiving this medicine? Side effects that you should report to your doctor or health care professional as soon as possible:  allergic reactions like skin rash, itching or hives, swelling of the face, lips, or tongue  breathing problems  changes in   vision  chest pain  confusion, trouble speaking or understanding  feeling faint or lightheaded, falls  high blood pressure  muscle aches or pains  pain, swelling, warmth in the leg  rapid weight gain  severe headaches  sudden numbness or weakness of the face, arm or leg  trouble walking, dizziness, loss of balance or coordination  seizures (convulsions)  swelling of the ankles, feet, hands  unusually weak or  tired Side effects that usually do not require medical attention (report to your doctor or health care professional if they continue or are bothersome):  diarrhea  fever, chills (flu-like symptoms)  headaches  nausea, vomiting  redness, stinging, or swelling at site where injected This list may not describe all possible side effects. Call your doctor for medical advice about side effects. You may report side effects to FDA at 1-800-FDA-1088. Where should I keep my medicine? Keep out of the reach of children. Store in a refrigerator between 2 and 8 degrees C (36 and 46 degrees F). Do not freeze. Do not shake. Throw away any unused portion if using a single-dose vial. Throw away any unused medicine after the expiration date. NOTE: This sheet is a summary. It may not cover all possible information. If you have questions about this medicine, talk to your doctor, pharmacist, or health care provider.  2020 Elsevier/Gold Standard (2017-02-24 16:44:20) Cyclophosphamide injection What is this medicine? CYCLOPHOSPHAMIDE (sye kloe FOSS fa mide) is a chemotherapy drug. It slows the growth of cancer cells. This medicine is used to treat many types of cancer like lymphoma, myeloma, leukemia, breast cancer, and ovarian cancer, to name a few. This medicine may be used for other purposes; ask your health care provider or pharmacist if you have questions. COMMON BRAND NAME(S): Cytoxan, Neosar What should I tell my health care provider before I take this medicine? They need to know if you have any of these conditions:  blood disorders  history of other chemotherapy  infection  kidney disease  liver disease  recent or ongoing radiation therapy  tumors in the bone marrow  an unusual or allergic reaction to cyclophosphamide, other chemotherapy, other medicines, foods, dyes, or preservatives  pregnant or trying to get pregnant  breast-feeding How should I use this medicine? This drug is  usually given as an injection into a vein or muscle or by infusion into a vein. It is administered in a hospital or clinic by a specially trained health care professional. Talk to your pediatrician regarding the use of this medicine in children. Special care may be needed. Overdosage: If you think you have taken too much of this medicine contact a poison control center or emergency room at once. NOTE: This medicine is only for you. Do not share this medicine with others. What if I miss a dose? It is important not to miss your dose. Call your doctor or health care professional if you are unable to keep an appointment. What may interact with this medicine? This medicine may interact with the following medications:  amiodarone  amphotericin B  azathioprine  certain antiviral medicines for HIV or AIDS such as protease inhibitors (e.g., indinavir, ritonavir) and zidovudine  certain blood pressure medications such as benazepril, captopril, enalapril, fosinopril, lisinopril, moexipril, monopril, perindopril, quinapril, ramipril, trandolapril  certain cancer medications such as anthracyclines (e.g., daunorubicin, doxorubicin), busulfan, cytarabine, paclitaxel, pentostatin, tamoxifen, trastuzumab  certain diuretics such as chlorothiazide, chlorthalidone, hydrochlorothiazide, indapamide, metolazone  certain medicines that treat or prevent blood clots like warfarin  certain muscle  relaxants such as succinylcholine  cyclosporine  etanercept  indomethacin  medicines to increase blood counts like filgrastim, pegfilgrastim, sargramostim  medicines used as general anesthesia  metronidazole  natalizumab This list may not describe all possible interactions. Give your health care provider a list of all the medicines, herbs, non-prescription drugs, or dietary supplements you use. Also tell them if you smoke, drink alcohol, or use illegal drugs. Some items may interact with your medicine. What  should I watch for while using this medicine? Visit your doctor for checks on your progress. This drug may make you feel generally unwell. This is not uncommon, as chemotherapy can affect healthy cells as well as cancer cells. Report any side effects. Continue your course of treatment even though you feel ill unless your doctor tells you to stop. Drink water or other fluids as directed. Urinate often, even at night. In some cases, you may be given additional medicines to help with side effects. Follow all directions for their use. Call your doctor or health care professional for advice if you get a fever, chills or sore throat, or other symptoms of a cold or flu. Do not treat yourself. This drug decreases your body's ability to fight infections. Try to avoid being around people who are sick. This medicine may increase your risk to bruise or bleed. Call your doctor or health care professional if you notice any unusual bleeding. Be careful brushing and flossing your teeth or using a toothpick because you may get an infection or bleed more easily. If you have any dental work done, tell your dentist you are receiving this medicine. You may get drowsy or dizzy. Do not drive, use machinery, or do anything that needs mental alertness until you know how this medicine affects you. Do not become pregnant while taking this medicine or for 1 year after stopping it. Women should inform their doctor if they wish to become pregnant or think they might be pregnant. Men should not father a child while taking this medicine and for 4 months after stopping it. There is a potential for serious side effects to an unborn child. Talk to your health care professional or pharmacist for more information. Do not breast-feed an infant while taking this medicine. This medicine may interfere with the ability to have a child. This medicine has caused ovarian failure in some women. This medicine has caused reduced sperm counts in some men.  You should talk with your doctor or health care professional if you are concerned about your fertility. If you are going to have surgery, tell your doctor or health care professional that you have taken this medicine. What side effects may I notice from receiving this medicine? Side effects that you should report to your doctor or health care professional as soon as possible:  allergic reactions like skin rash, itching or hives, swelling of the face, lips, or tongue  low blood counts - this medicine may decrease the number of white blood cells, red blood cells and platelets. You may be at increased risk for infections and bleeding.  signs of infection - fever or chills, cough, sore throat, pain or difficulty passing urine  signs of decreased platelets or bleeding - bruising, pinpoint red spots on the skin, black, tarry stools, blood in the urine  signs of decreased red blood cells - unusually weak or tired, fainting spells, lightheadedness  breathing problems  dark urine  dizziness  palpitations  swelling of the ankles, feet, hands  trouble passing urine or  change in the amount of urine  weight gain  yellowing of the eyes or skin Side effects that usually do not require medical attention (report to your doctor or health care professional if they continue or are bothersome):  changes in nail or skin color  hair loss  missed menstrual periods  mouth sores  nausea, vomiting This list may not describe all possible side effects. Call your doctor for medical advice about side effects. You may report side effects to FDA at 1-800-FDA-1088. Where should I keep my medicine? This drug is given in a hospital or clinic and will not be stored at home. NOTE: This sheet is a summary. It may not cover all possible information. If you have questions about this medicine, talk to your doctor, pharmacist, or health care provider.  2020 Elsevier/Gold Standard (2011-12-25  16:22:58) Carfilzomib injection What is this medicine? CARFILZOMIB (kar FILZ oh mib) targets a specific protein within cancer cells and stops the cancer cells from growing. It is used to treat multiple myeloma. This medicine may be used for other purposes; ask your health care provider or pharmacist if you have questions. COMMON BRAND NAME(S): KYPROLIS What should I tell my health care provider before I take this medicine? They need to know if you have any of these conditions:  heart disease  history of blood clots  irregular heartbeat  kidney disease  liver disease  lung or breathing disease  an unusual or allergic reaction to carfilzomib, or other medicines, foods, dyes, or preservatives  pregnant or trying to get pregnant  breast-feeding How should I use this medicine? This medicine is for injection or infusion into a vein. It is given by a health care professional in a hospital or clinic setting. Talk to your pediatrician regarding the use of this medicine in children. Special care may be needed. Overdosage: If you think you have taken too much of this medicine contact a poison control center or emergency room at once. NOTE: This medicine is only for you. Do not share this medicine with others. What if I miss a dose? It is important not to miss your dose. Call your doctor or health care professional if you are unable to keep an appointment. What may interact with this medicine? Interactions are not expected. Give your health care provider a list of all the medicines, herbs, non-prescription drugs, or dietary supplements you use. Also tell them if you smoke, drink alcohol, or use illegal drugs. Some items may interact with your medicine. This list may not describe all possible interactions. Give your health care provider a list of all the medicines, herbs, non-prescription drugs, or dietary supplements you use. Also tell them if you smoke, drink alcohol, or use illegal drugs.  Some items may interact with your medicine. What should I watch for while using this medicine? Your condition will be monitored carefully while you are receiving this medicine. Report any side effects. Continue your course of treatment even though you feel ill unless your doctor tells you to stop. You may need blood work done while you are taking this medicine. Do not become pregnant while taking this medicine or for at least 6 months after stopping it. Women should inform their doctor if they wish to become pregnant or think they might be pregnant. There is a potential for serious side effects to an unborn child. Men should not father a child while taking this medicine and for at least 3 months after stopping it. Talk to your health care  professional or pharmacist for more information. Do not breast-feed an infant while taking this medicine or for 2 weeks after the last dose. Check with your doctor or health care professional if you get an attack of severe diarrhea, nausea and vomiting, or if you sweat a lot. The loss of too much body fluid can make it dangerous for you to take this medicine. You may get dizzy. Do not drive, use machinery, or do anything that needs mental alertness until you know how this medicine affects you. Do not stand or sit up quickly, especially if you are an older patient. This reduces the risk of dizzy or fainting spells. What side effects may I notice from receiving this medicine? Side effects that you should report to your doctor or health care professional as soon as possible:  allergic reactions like skin rash, itching or hives, swelling of the face, lips, or tongue  confusion  dizziness  feeling faint or lightheaded  fever or chills  palpitations  seizures  signs and symptoms of bleeding such as bloody or black, tarry stools; red or dark-brown urine; spitting up blood or brown material that looks like coffee grounds; red spots on the skin; unusual bruising or  bleeding including from the eye, gums, or nose  signs and symptoms of a blood clot such as breathing problems; changes in vision; chest pain; severe, sudden headache; pain, swelling, warmth in the leg; trouble speaking; sudden numbness or weakness of the face, arm or leg  signs and symptoms of kidney injury like trouble passing urine or change in the amount of urine  signs and symptoms of liver injury like dark yellow or brown urine; general ill feeling or flu-like symptoms; light-colored stools; loss of appetite; nausea; right upper belly pain; unusually weak or tired; yellowing of the eyes or skin Side effects that usually do not require medical attention (report to your doctor or health care professional if they continue or are bothersome):  back pain  cough  diarrhea  headache  muscle cramps  vomiting This list may not describe all possible side effects. Call your doctor for medical advice about side effects. You may report side effects to FDA at 1-800-FDA-1088. Where should I keep my medicine? This drug is given in a hospital or clinic and will not be stored at home. NOTE: This sheet is a summary. It may not cover all possible information. If you have questions about this medicine, talk to your doctor, pharmacist, or health care provider.  2020 [employer]/Gold Standard (2016-11-25 14:07:13)

## 2018-10-24 ENCOUNTER — Other Ambulatory Visit: Payer: Self-pay

## 2018-10-24 ENCOUNTER — Inpatient Hospital Stay: Payer: Medicare Other

## 2018-10-24 ENCOUNTER — Inpatient Hospital Stay (HOSPITAL_BASED_OUTPATIENT_CLINIC_OR_DEPARTMENT_OTHER): Payer: Medicare Other | Admitting: Hematology & Oncology

## 2018-10-24 ENCOUNTER — Encounter: Payer: Self-pay | Admitting: Hematology & Oncology

## 2018-10-24 VITALS — BP 165/63 | HR 79 | Temp 98.4°F | Resp 16 | Wt 202.0 lb

## 2018-10-24 DIAGNOSIS — C9000 Multiple myeloma not having achieved remission: Secondary | ICD-10-CM | POA: Diagnosis not present

## 2018-10-24 DIAGNOSIS — Z299 Encounter for prophylactic measures, unspecified: Secondary | ICD-10-CM

## 2018-10-24 DIAGNOSIS — D500 Iron deficiency anemia secondary to blood loss (chronic): Secondary | ICD-10-CM

## 2018-10-24 DIAGNOSIS — E349 Endocrine disorder, unspecified: Secondary | ICD-10-CM

## 2018-10-24 DIAGNOSIS — D631 Anemia in chronic kidney disease: Secondary | ICD-10-CM

## 2018-10-24 DIAGNOSIS — Z5112 Encounter for antineoplastic immunotherapy: Secondary | ICD-10-CM | POA: Diagnosis not present

## 2018-10-24 DIAGNOSIS — M4712 Other spondylosis with myelopathy, cervical region: Secondary | ICD-10-CM

## 2018-10-24 DIAGNOSIS — D508 Other iron deficiency anemias: Secondary | ICD-10-CM

## 2018-10-24 DIAGNOSIS — N189 Chronic kidney disease, unspecified: Secondary | ICD-10-CM

## 2018-10-24 LAB — CMP (CANCER CENTER ONLY)
ALT: 13 U/L (ref 0–44)
AST: 15 U/L (ref 15–41)
Albumin: 4.1 g/dL (ref 3.5–5.0)
Alkaline Phosphatase: 73 U/L (ref 38–126)
Anion gap: 8 (ref 5–15)
BUN: 31 mg/dL — ABNORMAL HIGH (ref 8–23)
CO2: 27 mmol/L (ref 22–32)
Calcium: 9.1 mg/dL (ref 8.9–10.3)
Chloride: 106 mmol/L (ref 98–111)
Creatinine: 2.65 mg/dL — ABNORMAL HIGH (ref 0.61–1.24)
GFR, Est AFR Am: 27 mL/min — ABNORMAL LOW (ref >60)
GFR, Estimated: 24 mL/min — ABNORMAL LOW (ref >60)
Glucose, Bld: 113 mg/dL — ABNORMAL HIGH (ref 70–99)
Potassium: 4.5 mmol/L (ref 3.5–5.1)
Sodium: 141 mmol/L (ref 135–145)
Total Bilirubin: 0.4 mg/dL (ref 0.3–1.2)
Total Protein: 6.7 g/dL (ref 6.5–8.1)

## 2018-10-24 LAB — CBC WITH DIFFERENTIAL (CANCER CENTER ONLY)
Abs Immature Granulocytes: 0.01 10*3/uL (ref 0.00–0.07)
Basophils Absolute: 0 10*3/uL (ref 0.0–0.1)
Basophils Relative: 1 %
Eosinophils Absolute: 0.2 10*3/uL (ref 0.0–0.5)
Eosinophils Relative: 4 %
HCT: 34.1 % — ABNORMAL LOW (ref 39.0–52.0)
Hemoglobin: 10.6 g/dL — ABNORMAL LOW (ref 13.0–17.0)
Immature Granulocytes: 0 %
Lymphocytes Relative: 12 %
Lymphs Abs: 0.7 10*3/uL (ref 0.7–4.0)
MCH: 26.4 pg (ref 26.0–34.0)
MCHC: 31.1 g/dL (ref 30.0–36.0)
MCV: 84.8 fL (ref 80.0–100.0)
Monocytes Absolute: 0.6 10*3/uL (ref 0.1–1.0)
Monocytes Relative: 11 %
Neutro Abs: 3.8 10*3/uL (ref 1.7–7.7)
Neutrophils Relative %: 72 %
Platelet Count: 180 10*3/uL (ref 150–400)
RBC: 4.02 MIL/uL — ABNORMAL LOW (ref 4.22–5.81)
RDW: 18 % — ABNORMAL HIGH (ref 11.5–15.5)
WBC Count: 5.3 10*3/uL (ref 4.0–10.5)
nRBC: 0 % (ref 0.0–0.2)

## 2018-10-24 LAB — IRON AND TIBC
Iron: 66 ug/dL (ref 42–163)
Saturation Ratios: 29 % (ref 20–55)
TIBC: 230 ug/dL (ref 202–409)
UIBC: 164 ug/dL (ref 117–376)

## 2018-10-24 LAB — FERRITIN: Ferritin: 1437 ng/mL — ABNORMAL HIGH (ref 24–336)

## 2018-10-24 MED ORDER — DEXAMETHASONE SODIUM PHOSPHATE 10 MG/ML IJ SOLN
10.0000 mg | Freq: Once | INTRAMUSCULAR | Status: AC
  Administered 2018-10-24: 09:00:00 10 mg via INTRAVENOUS

## 2018-10-24 MED ORDER — DARBEPOETIN ALFA 300 MCG/0.6ML IJ SOSY
300.0000 ug | PREFILLED_SYRINGE | Freq: Once | INTRAMUSCULAR | Status: AC
  Administered 2018-10-24: 12:00:00 300 ug via SUBCUTANEOUS

## 2018-10-24 MED ORDER — PALONOSETRON HCL INJECTION 0.25 MG/5ML
INTRAVENOUS | Status: AC
  Filled 2018-10-24: qty 5

## 2018-10-24 MED ORDER — HEPARIN SOD (PORK) LOCK FLUSH 100 UNIT/ML IV SOLN
500.0000 [IU] | Freq: Once | INTRAVENOUS | Status: AC | PRN
  Administered 2018-10-24: 12:00:00 500 [IU]
  Filled 2018-10-24: qty 5

## 2018-10-24 MED ORDER — SODIUM CHLORIDE 0.9 % IV SOLN
300.0000 mg/m2 | Freq: Once | INTRAVENOUS | Status: AC
  Administered 2018-10-24: 11:00:00 620 mg via INTRAVENOUS
  Filled 2018-10-24: qty 31

## 2018-10-24 MED ORDER — SODIUM CHLORIDE 0.9 % IV SOLN
Freq: Once | INTRAVENOUS | Status: AC
  Filled 2018-10-24: qty 250

## 2018-10-24 MED ORDER — HYDROMORPHONE HCL 1 MG/ML IJ SOLN
2.0000 mg | Freq: Once | INTRAMUSCULAR | Status: AC
  Administered 2018-10-24: 09:00:00 2 mg via INTRAVENOUS

## 2018-10-24 MED ORDER — PALONOSETRON HCL INJECTION 0.25 MG/5ML
0.2500 mg | Freq: Once | INTRAVENOUS | Status: AC
  Administered 2018-10-24: 09:00:00 0.25 mg via INTRAVENOUS

## 2018-10-24 MED ORDER — DEXAMETHASONE SODIUM PHOSPHATE 10 MG/ML IJ SOLN
INTRAMUSCULAR | Status: AC
  Filled 2018-10-24: qty 1

## 2018-10-24 MED ORDER — SODIUM CHLORIDE 0.9 % IV SOLN
Freq: Once | INTRAVENOUS | Status: AC
  Administered 2018-10-24: 09:00:00 via INTRAVENOUS
  Filled 2018-10-24: qty 250

## 2018-10-24 MED ORDER — SODIUM CHLORIDE 0.9% FLUSH
10.0000 mL | INTRAVENOUS | Status: DC | PRN
  Administered 2018-10-24: 12:00:00 10 mL
  Filled 2018-10-24: qty 10

## 2018-10-24 MED ORDER — HYDROMORPHONE HCL 1 MG/ML IJ SOLN
INTRAMUSCULAR | Status: AC
  Filled 2018-10-24: qty 2

## 2018-10-24 MED ORDER — DARBEPOETIN ALFA 300 MCG/0.6ML IJ SOSY
PREFILLED_SYRINGE | INTRAMUSCULAR | Status: AC
  Filled 2018-10-24: qty 0.6

## 2018-10-24 MED ORDER — DEXTROSE 5 % IV SOLN
28.6000 mg/m2 | Freq: Once | INTRAVENOUS | Status: AC
  Administered 2018-10-24: 10:00:00 60 mg via INTRAVENOUS
  Filled 2018-10-24: qty 30

## 2018-10-24 NOTE — Patient Instructions (Signed)
Cyclophosphamide injection What is this medicine? CYCLOPHOSPHAMIDE (sye kloe FOSS fa mide) is a chemotherapy drug. It slows the growth of cancer cells. This medicine is used to treat many types of cancer like lymphoma, myeloma, leukemia, breast cancer, and ovarian cancer, to name a few. This medicine may be used for other purposes; ask your health care provider or pharmacist if you have questions. COMMON BRAND NAME(S): Cytoxan, Neosar What should I tell my health care provider before I take this medicine? They need to know if you have any of these conditions:  blood disorders  history of other chemotherapy  infection  kidney disease  liver disease  recent or ongoing radiation therapy  tumors in the bone marrow  an unusual or allergic reaction to cyclophosphamide, other chemotherapy, other medicines, foods, dyes, or preservatives  pregnant or trying to get pregnant  breast-feeding How should I use this medicine? This drug is usually given as an injection into a vein or muscle or by infusion into a vein. It is administered in a hospital or clinic by a specially trained health care professional. Talk to your pediatrician regarding the use of this medicine in children. Special care may be needed. Overdosage: If you think you have taken too much of this medicine contact a poison control center or emergency room at once. NOTE: This medicine is only for you. Do not share this medicine with others. What if I miss a dose? It is important not to miss your dose. Call your doctor or health care professional if you are unable to keep an appointment. What may interact with this medicine? This medicine may interact with the following medications:  amiodarone  amphotericin B  azathioprine  certain antiviral medicines for HIV or AIDS such as protease inhibitors (e.g., indinavir, ritonavir) and zidovudine  certain blood pressure medications such as benazepril, captopril, enalapril,  fosinopril, lisinopril, moexipril, monopril, perindopril, quinapril, ramipril, trandolapril  certain cancer medications such as anthracyclines (e.g., daunorubicin, doxorubicin), busulfan, cytarabine, paclitaxel, pentostatin, tamoxifen, trastuzumab  certain diuretics such as chlorothiazide, chlorthalidone, hydrochlorothiazide, indapamide, metolazone  certain medicines that treat or prevent blood clots like warfarin  certain muscle relaxants such as succinylcholine  cyclosporine  etanercept  indomethacin  medicines to increase blood counts like filgrastim, pegfilgrastim, sargramostim  medicines used as general anesthesia  metronidazole  natalizumab This list may not describe all possible interactions. Give your health care provider a list of all the medicines, herbs, non-prescription drugs, or dietary supplements you use. Also tell them if you smoke, drink alcohol, or use illegal drugs. Some items may interact with your medicine. What should I watch for while using this medicine? Visit your doctor for checks on your progress. This drug may make you feel generally unwell. This is not uncommon, as chemotherapy can affect healthy cells as well as cancer cells. Report any side effects. Continue your course of treatment even though you feel ill unless your doctor tells you to stop. Drink water or other fluids as directed. Urinate often, even at night. In some cases, you may be given additional medicines to help with side effects. Follow all directions for their use. Call your doctor or health care professional for advice if you get a fever, chills or sore throat, or other symptoms of a cold or flu. Do not treat yourself. This drug decreases your body's ability to fight infections. Try to avoid being around people who are sick. This medicine may increase your risk to bruise or bleed. Call your doctor or health care professional  if you notice any unusual bleeding. Be careful brushing and  flossing your teeth or using a toothpick because you may get an infection or bleed more easily. If you have any dental work done, tell your dentist you are receiving this medicine. You may get drowsy or dizzy. Do not drive, use machinery, or do anything that needs mental alertness until you know how this medicine affects you. Do not become pregnant while taking this medicine or for 1 year after stopping it. Women should inform their doctor if they wish to become pregnant or think they might be pregnant. Men should not father a child while taking this medicine and for 4 months after stopping it. There is a potential for serious side effects to an unborn child. Talk to your health care professional or pharmacist for more information. Do not breast-feed an infant while taking this medicine. This medicine may interfere with the ability to have a child. This medicine has caused ovarian failure in some women. This medicine has caused reduced sperm counts in some men. You should talk with your doctor or health care professional if you are concerned about your fertility. If you are going to have surgery, tell your doctor or health care professional that you have taken this medicine. What side effects may I notice from receiving this medicine? Side effects that you should report to your doctor or health care professional as soon as possible:  allergic reactions like skin rash, itching or hives, swelling of the face, lips, or tongue  low blood counts - this medicine may decrease the number of white blood cells, red blood cells and platelets. You may be at increased risk for infections and bleeding.  signs of infection - fever or chills, cough, sore throat, pain or difficulty passing urine  signs of decreased platelets or bleeding - bruising, pinpoint red spots on the skin, black, tarry stools, blood in the urine  signs of decreased red blood cells - unusually weak or tired, fainting spells,  lightheadedness  breathing problems  dark urine  dizziness  palpitations  swelling of the ankles, feet, hands  trouble passing urine or change in the amount of urine  weight gain  yellowing of the eyes or skin Side effects that usually do not require medical attention (report to your doctor or health care professional if they continue or are bothersome):  changes in nail or skin color  hair loss  missed menstrual periods  mouth sores  nausea, vomiting This list may not describe all possible side effects. Call your doctor for medical advice about side effects. You may report side effects to FDA at 1-800-FDA-1088. Where should I keep my medicine? This drug is given in a hospital or clinic and will not be stored at home. NOTE: This sheet is a summary. It may not cover all possible information. If you have questions about this medicine, talk to your doctor, pharmacist, or health care provider.  2020 Elsevier/Gold Standard (2011-12-25 16:22:58) Carfilzomib injection What is this medicine? CARFILZOMIB (kar FILZ oh mib) targets a specific protein within cancer cells and stops the cancer cells from growing. It is used to treat multiple myeloma. This medicine may be used for other purposes; ask your health care provider or pharmacist if you have questions. COMMON BRAND NAME(S): KYPROLIS What should I tell my health care provider before I take this medicine? They need to know if you have any of these conditions:  heart disease  history of blood clots  irregular heartbeat  kidney disease  liver disease  lung or breathing disease  an unusual or allergic reaction to carfilzomib, or other medicines, foods, dyes, or preservatives  pregnant or trying to get pregnant  breast-feeding How should I use this medicine? This medicine is for injection or infusion into a vein. It is given by a health care professional in a hospital or clinic setting. Talk to your pediatrician  regarding the use of this medicine in children. Special care may be needed. Overdosage: If you think you have taken too much of this medicine contact a poison control center or emergency room at once. NOTE: This medicine is only for you. Do not share this medicine with others. What if I miss a dose? It is important not to miss your dose. Call your doctor or health care professional if you are unable to keep an appointment. What may interact with this medicine? Interactions are not expected. Give your health care provider a list of all the medicines, herbs, non-prescription drugs, or dietary supplements you use. Also tell them if you smoke, drink alcohol, or use illegal drugs. Some items may interact with your medicine. This list may not describe all possible interactions. Give your health care provider a list of all the medicines, herbs, non-prescription drugs, or dietary supplements you use. Also tell them if you smoke, drink alcohol, or use illegal drugs. Some items may interact with your medicine. What should I watch for while using this medicine? Your condition will be monitored carefully while you are receiving this medicine. Report any side effects. Continue your course of treatment even though you feel ill unless your doctor tells you to stop. You may need blood work done while you are taking this medicine. Do not become pregnant while taking this medicine or for at least 6 months after stopping it. Women should inform their doctor if they wish to become pregnant or think they might be pregnant. There is a potential for serious side effects to an unborn child. Men should not father a child while taking this medicine and for at least 3 months after stopping it. Talk to your health care professional or pharmacist for more information. Do not breast-feed an infant while taking this medicine or for 2 weeks after the last dose. Check with your doctor or health care professional if you get an attack of  severe diarrhea, nausea and vomiting, or if you sweat a lot. The loss of too much body fluid can make it dangerous for you to take this medicine. You may get dizzy. Do not drive, use machinery, or do anything that needs mental alertness until you know how this medicine affects you. Do not stand or sit up quickly, especially if you are an older patient. This reduces the risk of dizzy or fainting spells. What side effects may I notice from receiving this medicine? Side effects that you should report to your doctor or health care professional as soon as possible:  allergic reactions like skin rash, itching or hives, swelling of the face, lips, or tongue  confusion  dizziness  feeling faint or lightheaded  fever or chills  palpitations  seizures  signs and symptoms of bleeding such as bloody or black, tarry stools; red or dark-brown urine; spitting up blood or brown material that looks like coffee grounds; red spots on the skin; unusual bruising or bleeding including from the eye, gums, or nose  signs and symptoms of a blood clot such as breathing problems; changes in vision; chest pain; severe,  sudden headache; pain, swelling, warmth in the leg; trouble speaking; sudden numbness or weakness of the face, arm or leg  signs and symptoms of kidney injury like trouble passing urine or change in the amount of urine  signs and symptoms of liver injury like dark yellow or brown urine; general ill feeling or flu-like symptoms; light-colored stools; loss of appetite; nausea; right upper belly pain; unusually weak or tired; yellowing of the eyes or skin Side effects that usually do not require medical attention (report to your doctor or health care professional if they continue or are bothersome):  back pain  cough  diarrhea  headache  muscle cramps  vomiting This list may not describe all possible side effects. Call your doctor for medical advice about side effects. You may report side  effects to FDA at 1-800-FDA-1088. Where should I keep my medicine? This drug is given in a hospital or clinic and will not be stored at home. NOTE: This sheet is a summary. It may not cover all possible information. If you have questions about this medicine, talk to your doctor, pharmacist, or health care provider.  2020 Elsevier/Gold Standard (2016-11-25 14:07:13)

## 2018-10-24 NOTE — Patient Instructions (Signed)

## 2018-10-24 NOTE — Progress Notes (Signed)
Ok to treat with creatinine of 2.65 per Dr [name]. dph

## 2018-10-24 NOTE — Progress Notes (Signed)
Hematology and Oncology Follow Up Visit  [name] [id number] 06/21/1950 68 y.o. 10/24/2018   Principle Diagnosis:  IgG kappa myeloma  Anemia secondary to renal insufficiency  Intermittent iron - deficiency anemia  Hypotestosteronemia   Current Therapy:   Aredia 60 mg IV q 3 months-- next dose 09/2018 Aranesp 300 mcg subcu as needed for hemoglobin less than 11 DepoTestosterone400 mg q4weeks IV iron as indicated Kyprolis/Cytoxan - s/p cycle20   Interim History:  [name] is here today for follow-up and treatment.  He actually is feeling pretty good today.  He really does not have a lot of complaints.  He is looking forward to football season.  As always, he is totally into the [facility].  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  I know that this really helps his quality of life.  He really is a big sports guy.  As far as his myeloma is concerned, we are doing quite well with this.  His last myeloma studies did not show an M spike.  His IgG level was 987 mg/dL.  His kappa light chain was 3.7 mg/dL.    His testosterone level has been okay.  Again we last checked a month ago, his testosterone level was 1200.  With that, he does not need as much testosterone.  His iron studies today show a ferritin of 1400.  His iron saturation is only 29%.  A lot of the ferritin elevation is secondary to inflammatory issues with respect to his fibromyalgia.  Overall, has had no fever.  He has had no change in bowel or bladder habits.  He has had no cough.  He has had no mouth sores.  He has had no leg swelling.  Overall, his performance status is ECOG 1.    Medications:  Allergies as of 10/24/2018      Reactions   Iodinated Diagnostic Agents Rash   Patient states he was instructed not to take IV contrast.  In 2008 he had an unknown reaction, and was told not to take it again.  He was also told not to take it due to his kidneys.   Iodine Anxiety, Rash, Other  (See Comments)   Didn't feel right "instructed not to take per MD--something with his port"      Medication List       Accurate as of October 24, 2018  8:45 AM. If you have any questions, ask your nurse or doctor.        amLODipine 5 MG tablet Commonly known as: NORVASC Take 5 mg by mouth daily.   atorvastatin 40 MG tablet Commonly known as: LIPITOR daily.   Carafate 1 GM/10ML suspension Generic drug: sucralfate Take 1 g by mouth 4 (four) times daily.   Clear Eyes All Seasons 5-6 MG/ML Soln Generic drug: Polyvinyl Alcohol-Povidone Place 2 drops into both eyes 3 (three) times daily as needed (drye eyes.).   cyclobenzaprine 10 MG tablet Commonly known as: FLEXERIL Take 10 mg by mouth 3 (three) times daily.   dicyclomine 10 MG capsule Commonly known as: BENTYL TAKE 1 CAPSULE BY MOUTH THREE TIMES A DAY   dronabinol 2.5 MG capsule Commonly known as: MARINOL Take 1 capsule (2.5 mg total) by mouth 2 (two) times daily before a meal.   Embeda 30-1.2 MG Cpcr Generic drug: Morphine-Naltrexone Take 3 (three) times daily by mouth.   Embeda 20-0.8 MG Cpcr Generic drug: Morphine-Naltrexone Take 1 capsule by mouth 3 (three) times daily.  furosemide 40 MG tablet Commonly known as: LASIX Take 40 mg by mouth daily.   gabapentin 300 MG capsule Commonly known as: NEURONTIN TAKE 1 CAPSULE BY MOUTH THREE TIMES A DAY   glipiZIDE 5 MG tablet Commonly known as: GLUCOTROL Take 5 mg by mouth 2 (two) times daily before a meal.   HYDROcodone-acetaminophen 5-325 MG tablet Commonly known as: Norco Take 1-2 tablets by mouth every 6 (six) hours as needed. What changed: reasons to take this   lactulose 10 GM/15ML solution Commonly known as: CHRONULAC Take 20 g by mouth daily.   latanoprost 0.005 % ophthalmic solution Commonly known as: XALATAN Place 1 drop into the right eye every evening.   lidocaine-prilocaine cream Commonly known as: EMLA Apply 1 application topically as  needed.   linaclotide 145 MCG Caps capsule Commonly known as: LINZESS Take 1 capsule (145 mcg total) by mouth daily before breakfast. What changed:   when to take this  reasons to take this   LORazepam 0.5 MG tablet Commonly known as: Ativan Take 1 tablet (0.5 mg total) by mouth every 6 (six) hours as needed (Nausea or vomiting).   losartan 25 MG tablet Commonly known as: COZAAR Take 25 mg by mouth.   mesalamine 1.2 g EC tablet Commonly known as: Lialda Take 1 tablet (1.2 g total) by mouth daily with breakfast.   metoprolol succinate 100 MG 24 hr tablet Commonly known as: TOPROL-XL Take 100 mg by mouth daily.   mirabegron ER 50 MG Tb24 tablet Commonly known as: MYRBETRIQ Take 50 mg by mouth daily.   morphine 30 MG 12 hr tablet Commonly known as: MS CONTIN Take 30 mg by mouth 2 (two) times daily.   omeprazole 40 MG capsule Commonly known as: PRILOSEC TAKE 1 CAPSULE BY MOUTH TWICE A DAY   ondansetron 8 MG tablet Commonly known as: ZOFRAN TAKE 1 TABLET(8 MG) BY MOUTH TWICE DAILY AS NEEDED FOR NAUSEA OR VOMITING What changed: See the new instructions.   orphenadrine 100 MG tablet Commonly known as: NORFLEX Take 1 tablet (100 mg total) by mouth 2 (two) times daily.   oxyCODONE-acetaminophen 10-325 MG tablet Commonly known as: PERCOCET Take 1 tablet by mouth every 6 (six) hours as needed for pain.   pioglitazone 30 MG tablet Commonly known as: ACTOS Take 30 mg by mouth daily.   potassium chloride SA 20 MEQ tablet Commonly known as: K-DUR Take 1 tablet (20 mEq total) by mouth 2 (two) times daily.   prochlorperazine 10 MG tablet Commonly known as: COMPAZINE take 1 tablet by mouth every 6 hours if needed for nausea and vomiting What changed:   how much to take  how to take this  when to take this  reasons to take this   Relistor 150 MG Tabs Generic drug: Methylnaltrexone Bromide Take 150 mg by mouth.   Sennosides 25 MG Tabs Take 25 mg by mouth  daily as needed (constipation).   Vitamin D (Ergocalciferol) 1.25 MG (50000 UT) Caps capsule Commonly known as: DRISDOL Take 50,000 Units by mouth every Saturday.   Xtampza ER 13.5 MG C12a Generic drug: oxyCODONE ER Take 1 capsule by mouth 2 (two) times daily.   Zioptan 0.0015 % Soln Generic drug: Tafluprost (PF) Place 1 drop into both eyes daily.       Allergies:  Allergies  Allergen Reactions  . Iodinated Diagnostic Agents Rash    Patient states he was instructed not to take IV contrast.  In 2008 he had an unknown reaction,  and was told not to take it again.  He was also told not to take it due to his kidneys.  . Iodine Anxiety, Rash and Other (See Comments)    Didn't feel right "instructed not to take per MD--something with his port"     Past Medical History, Surgical history, Social history, and Family History were reviewed and updated.  Review of Systems: Review of Systems  Constitutional: Negative.   HENT: Negative.   Eyes: Negative.   Respiratory: Negative.   Cardiovascular: Negative.   Gastrointestinal: Negative.   Genitourinary: Negative.   Musculoskeletal: Positive for joint pain and myalgias.  Skin: Negative.   Neurological: Negative.   Endo/Heme/Allergies: Negative.   Psychiatric/Behavioral: Negative.      Physical Exam:  weight is 202 lb (91.6 kg). His oral temperature is 98.4 F (36.9 C). His blood pressure is 165/63 (abnormal) and his pulse is 79. His respiration is 16 and oxygen saturation is 100%.   Wt Readings from Last 3 Encounters:  10/24/18 202 lb (91.6 kg)  10/10/18 199 lb 1.3 oz (90.3 kg)  09/26/18 202 lb (91.6 kg)    Physical Exam Vitals signs reviewed.  HENT:     Head: Normocephalic and atraumatic.  Eyes:     Pupils: Pupils are equal, round, and reactive to light.  Neck:     Musculoskeletal: Normal range of motion.  Cardiovascular:     Rate and Rhythm: Normal rate and regular rhythm.     Heart sounds: Normal heart sounds.   Pulmonary:     Effort: Pulmonary effort is normal.     Breath sounds: Normal breath sounds.  Abdominal:     General: Bowel sounds are normal.     Palpations: Abdomen is soft.  Musculoskeletal: Normal range of motion.        General: No tenderness or deformity.  Lymphadenopathy:     Cervical: No cervical adenopathy.  Skin:    General: Skin is warm and dry.     Findings: No erythema or rash.  Neurological:     Mental Status: He is alert and oriented to person, place, and time.  Psychiatric:        Behavior: Behavior normal.        Thought Content: Thought content normal.        Judgment: Judgment normal.      Lab Results  Component Value Date   WBC 5.3 10/24/2018   HGB 10.6 (L) 10/24/2018   HCT 34.1 (L) 10/24/2018   MCV 84.8 10/24/2018   PLT 180 10/24/2018   Lab Results  Component Value Date   FERRITIN 2,208 (H) 09/26/2018   IRON 72 09/26/2018   TIBC 233 09/26/2018   UIBC 160 09/26/2018   IRONPCTSAT 31 09/26/2018   Lab Results  Component Value Date   RETICCTPCT 0.6 07/25/2018   RBC 4.02 (L) 10/24/2018   RETICCTABS 27.5 03/21/2014   Lab Results  Component Value Date   KPAFRELGTCHN 37.5 (H) 09/26/2018   LAMBDASER 20.3 09/26/2018   KAPLAMBRATIO 1.85 (H) 09/26/2018   Lab Results  Component Value Date   IGGSERUM 987 09/26/2018   IGA 175 09/26/2018   IGMSERUM 26 09/26/2018   Lab Results  Component Value Date   TOTALPROTELP 6.7 09/26/2018   ALBUMINELP 3.8 09/26/2018   A1GS 0.2 09/26/2018   A2GS 1.0 09/26/2018   BETS 0.8 09/26/2018   BETA2SER 0.5 11/07/2014   GAMS 0.9 09/26/2018   MSPIKE Not Observed 09/26/2018   SPEI Comment 09/12/2018  Chemistry      Component Value Date/Time   NA 143 10/10/2018 0832   NA 143 02/22/2017 0744   K 3.7 10/10/2018 0832   K 4.0 02/22/2017 0744   CL 104 10/10/2018 0832   CL 106 02/22/2017 0744   CO2 31 10/10/2018 0832   CO2 27 02/22/2017 0744   BUN 18 10/10/2018 0832   BUN 26 (H) 02/22/2017 0744   CREATININE  1.86 (H) 10/10/2018 0832   CREATININE 2.3 (H) 02/22/2017 0744      Component Value Date/Time   CALCIUM 9.1 10/10/2018 0832   CALCIUM 9.3 02/22/2017 0744   ALKPHOS 65 10/10/2018 0832   ALKPHOS 80 02/22/2017 0744   AST 21 10/10/2018 0832   ALT 17 10/10/2018 0832   ALT 22 02/22/2017 0744   BILITOT 0.4 10/10/2018 0832       Impression and Plan: Mr. Robey is a very pleasant 68 yo African American gentleman with IgG kappa myeloma.  Thankfully, this really has not been much of a problem for [place].  I really think that he will not succumbed to his myeloma but that he will have something else, more so related to his diabetes that will take him away.  We will go ahead with his chemotherapy today.  I am going to try to move his appointments out to every 3 weeks now.  Hopefully we will still get a good response with moving his treatments out.  Again, his quality of life is what I am most interested in.  I spent about 30 minutes with him today.  He has a lot of issues.  He considers [place] his family doctor and comes to [place] for a lot of his issues.  We try to help him out.  However, I know he has a wonderful family doctor that does a great job with his medical problems.    Volanda Napoleon, MD 8/31/20208:45 AM

## 2018-10-25 LAB — IGG, IGA, IGM
IgA: 158 mg/dL (ref 61–437)
IgG (Immunoglobin G), Serum: 903 mg/dL (ref 603–1613)
IgM (Immunoglobulin M), Srm: 28 mg/dL (ref 20–172)

## 2018-10-25 LAB — KAPPA/LAMBDA LIGHT CHAINS
Kappa free light chain: 33.7 mg/L — ABNORMAL HIGH (ref 3.3–19.4)
Kappa, lambda light chain ratio: 1.46 (ref 0.26–1.65)
Lambda free light chains: 23.1 mg/L (ref 5.7–26.3)

## 2018-10-26 LAB — PROTEIN ELECTROPHORESIS, SERUM
A/G Ratio: 1.5 (ref 0.7–1.7)
Albumin ELP: 3.8 g/dL (ref 2.9–4.4)
Alpha-1-Globulin: 0.2 g/dL (ref 0.0–0.4)
Alpha-2-Globulin: 0.9 g/dL (ref 0.4–1.0)
Beta Globulin: 0.8 g/dL (ref 0.7–1.3)
Gamma Globulin: 0.7 g/dL (ref 0.4–1.8)
Globulin, Total: 2.6 g/dL (ref 2.2–3.9)
Total Protein ELP: 6.4 g/dL (ref 6.0–8.5)

## 2018-11-03 ENCOUNTER — Other Ambulatory Visit: Payer: Self-pay | Admitting: *Deleted

## 2018-11-03 DIAGNOSIS — K219 Gastro-esophageal reflux disease without esophagitis: Secondary | ICD-10-CM

## 2018-11-03 MED ORDER — OMEPRAZOLE 40 MG PO CPDR: 40.0000 mg | DELAYED_RELEASE_CAPSULE | Freq: Two times a day (BID) | ORAL | 0 refills | Status: DC

## 2018-11-04 ENCOUNTER — Other Ambulatory Visit: Payer: Self-pay | Admitting: Family

## 2018-11-04 ENCOUNTER — Encounter: Payer: Self-pay | Admitting: *Deleted

## 2018-11-04 DIAGNOSIS — R1310 Dysphagia, unspecified: Secondary | ICD-10-CM

## 2018-11-04 DIAGNOSIS — R1319 Other dysphagia: Secondary | ICD-10-CM

## 2018-11-04 DIAGNOSIS — R1084 Generalized abdominal pain: Secondary | ICD-10-CM

## 2018-11-14 ENCOUNTER — Other Ambulatory Visit: Payer: Self-pay

## 2018-11-14 ENCOUNTER — Inpatient Hospital Stay: Payer: Medicare Other

## 2018-11-14 ENCOUNTER — Encounter: Payer: Self-pay | Admitting: Family

## 2018-11-14 ENCOUNTER — Inpatient Hospital Stay: Payer: Medicare Other | Attending: Hematology & Oncology | Admitting: Family

## 2018-11-14 ENCOUNTER — Telehealth: Payer: Self-pay | Admitting: Family

## 2018-11-14 VITALS — BP 148/88 | HR 81 | Temp 98.4°F | Resp 19 | Wt 196.0 lb

## 2018-11-14 VITALS — BP 148/88 | HR 81 | Temp 98.6°F | Resp 19 | Ht 71.0 in | Wt 196.0 lb

## 2018-11-14 DIAGNOSIS — E291 Testicular hypofunction: Secondary | ICD-10-CM | POA: Diagnosis not present

## 2018-11-14 DIAGNOSIS — K5909 Other constipation: Secondary | ICD-10-CM

## 2018-11-14 DIAGNOSIS — C9000 Multiple myeloma not having achieved remission: Secondary | ICD-10-CM

## 2018-11-14 DIAGNOSIS — E349 Endocrine disorder, unspecified: Secondary | ICD-10-CM

## 2018-11-14 DIAGNOSIS — Z23 Encounter for immunization: Secondary | ICD-10-CM | POA: Diagnosis not present

## 2018-11-14 DIAGNOSIS — Z5112 Encounter for antineoplastic immunotherapy: Secondary | ICD-10-CM | POA: Diagnosis present

## 2018-11-14 DIAGNOSIS — D500 Iron deficiency anemia secondary to blood loss (chronic): Secondary | ICD-10-CM

## 2018-11-14 DIAGNOSIS — D509 Iron deficiency anemia, unspecified: Secondary | ICD-10-CM | POA: Insufficient documentation

## 2018-11-14 DIAGNOSIS — D508 Other iron deficiency anemias: Secondary | ICD-10-CM

## 2018-11-14 DIAGNOSIS — Z299 Encounter for prophylactic measures, unspecified: Secondary | ICD-10-CM

## 2018-11-14 DIAGNOSIS — Z79899 Other long term (current) drug therapy: Secondary | ICD-10-CM | POA: Insufficient documentation

## 2018-11-14 DIAGNOSIS — Z5111 Encounter for antineoplastic chemotherapy: Secondary | ICD-10-CM | POA: Insufficient documentation

## 2018-11-14 DIAGNOSIS — C9001 Multiple myeloma in remission: Secondary | ICD-10-CM

## 2018-11-14 DIAGNOSIS — D631 Anemia in chronic kidney disease: Secondary | ICD-10-CM

## 2018-11-14 LAB — IRON AND TIBC
Iron: 74 ug/dL (ref 42–163)
Saturation Ratios: 29 % (ref 20–55)
TIBC: 258 ug/dL (ref 202–409)
UIBC: 184 ug/dL (ref 117–376)

## 2018-11-14 LAB — CBC WITH DIFFERENTIAL (CANCER CENTER ONLY)
Abs Immature Granulocytes: 0.01 10*3/uL (ref 0.00–0.07)
Basophils Absolute: 0 10*3/uL (ref 0.0–0.1)
Basophils Relative: 1 %
Eosinophils Absolute: 0.1 10*3/uL (ref 0.0–0.5)
Eosinophils Relative: 2 %
HCT: 38.7 % — ABNORMAL LOW (ref 39.0–52.0)
Hemoglobin: 12.1 g/dL — ABNORMAL LOW (ref 13.0–17.0)
Immature Granulocytes: 0 %
Lymphocytes Relative: 13 %
Lymphs Abs: 0.6 10*3/uL — ABNORMAL LOW (ref 0.7–4.0)
MCH: 25.9 pg — ABNORMAL LOW (ref 26.0–34.0)
MCHC: 31.3 g/dL (ref 30.0–36.0)
MCV: 82.7 fL (ref 80.0–100.0)
Monocytes Absolute: 0.5 10*3/uL (ref 0.1–1.0)
Monocytes Relative: 10 %
Neutro Abs: 3.4 10*3/uL (ref 1.7–7.7)
Neutrophils Relative %: 74 %
Platelet Count: 158 10*3/uL (ref 150–400)
RBC: 4.68 MIL/uL (ref 4.22–5.81)
RDW: 17.9 % — ABNORMAL HIGH (ref 11.5–15.5)
WBC Count: 4.6 10*3/uL (ref 4.0–10.5)
nRBC: 0 % (ref 0.0–0.2)

## 2018-11-14 LAB — CMP (CANCER CENTER ONLY)
ALT: 19 U/L (ref 0–44)
AST: 18 U/L (ref 15–41)
Albumin: 4.3 g/dL (ref 3.5–5.0)
Alkaline Phosphatase: 73 U/L (ref 38–126)
Anion gap: 9 (ref 5–15)
BUN: 40 mg/dL — ABNORMAL HIGH (ref 8–23)
CO2: 29 mmol/L (ref 22–32)
Calcium: 9.3 mg/dL (ref 8.9–10.3)
Chloride: 103 mmol/L (ref 98–111)
Creatinine: 2.64 mg/dL — ABNORMAL HIGH (ref 0.61–1.24)
GFR, Est AFR Am: 27 mL/min — ABNORMAL LOW (ref >60)
GFR, Estimated: 24 mL/min — ABNORMAL LOW (ref >60)
Glucose, Bld: 114 mg/dL — ABNORMAL HIGH (ref 70–99)
Potassium: 3.7 mmol/L (ref 3.5–5.1)
Sodium: 141 mmol/L (ref 135–145)
Total Bilirubin: 0.7 mg/dL (ref 0.3–1.2)
Total Protein: 7.2 g/dL (ref 6.5–8.1)

## 2018-11-14 LAB — FERRITIN: Ferritin: 1150 ng/mL — ABNORMAL HIGH (ref 24–336)

## 2018-11-14 MED ORDER — INFLUENZA VAC SPLIT QUAD 0.5 ML IM SUSY
PREFILLED_SYRINGE | INTRAMUSCULAR | Status: AC
  Filled 2018-11-14: qty 0.5

## 2018-11-14 MED ORDER — DEXAMETHASONE SODIUM PHOSPHATE 10 MG/ML IJ SOLN
10.0000 mg | Freq: Once | INTRAMUSCULAR | Status: AC
  Administered 2018-11-14: 10 mg via INTRAVENOUS

## 2018-11-14 MED ORDER — LACTULOSE 10 GM/15ML PO SOLN: 20.0000 g | Freq: Every day | ORAL | 1 refills | Status: DC

## 2018-11-14 MED ORDER — TESTOSTERONE CYPIONATE 200 MG/ML IM SOLN
400.0000 mg | Freq: Once | INTRAMUSCULAR | Status: AC
  Administered 2018-11-14: 400 mg via INTRAMUSCULAR

## 2018-11-14 MED ORDER — PALONOSETRON HCL INJECTION 0.25 MG/5ML
0.2500 mg | Freq: Once | INTRAVENOUS | Status: AC
  Administered 2018-11-14: 0.25 mg via INTRAVENOUS

## 2018-11-14 MED ORDER — SODIUM CHLORIDE 0.9 % IV SOLN
Freq: Once | INTRAVENOUS | Status: AC
  Filled 2018-11-14: qty 250

## 2018-11-14 MED ORDER — HYDROMORPHONE HCL 1 MG/ML IJ SOLN
2.0000 mg | Freq: Once | INTRAMUSCULAR | Status: AC
  Administered 2018-11-14: 2 mg via INTRAVENOUS

## 2018-11-14 MED ORDER — SODIUM CHLORIDE 0.9 % IV SOLN
INTRAVENOUS | Status: DC
  Administered 2018-11-14: 09:00:00 via INTRAVENOUS
  Filled 2018-11-14: qty 250

## 2018-11-14 MED ORDER — PALONOSETRON HCL INJECTION 0.25 MG/5ML
INTRAVENOUS | Status: AC
  Filled 2018-11-14: qty 5

## 2018-11-14 MED ORDER — TESTOSTERONE CYPIONATE 200 MG/ML IM SOLN
INTRAMUSCULAR | Status: AC
  Filled 2018-11-14: qty 2

## 2018-11-14 MED ORDER — DRONABINOL 2.5 MG PO CAPS: 2.5000 mg | ORAL_CAPSULE | Freq: Two times a day (BID) | ORAL | 3 refills | Status: DC

## 2018-11-14 MED ORDER — SODIUM CHLORIDE 0.9% FLUSH
10.0000 mL | INTRAVENOUS | Status: DC | PRN
  Administered 2018-11-14: 10 mL
  Filled 2018-11-14: qty 10

## 2018-11-14 MED ORDER — HEPARIN SOD (PORK) LOCK FLUSH 100 UNIT/ML IV SOLN
500.0000 [IU] | Freq: Once | INTRAVENOUS | Status: AC | PRN
  Administered 2018-11-14: 11:00:00 500 [IU]
  Filled 2018-11-14: qty 5

## 2018-11-14 MED ORDER — DEXTROSE 5 % IV SOLN
29.0000 mg/m2 | Freq: Once | INTRAVENOUS | Status: AC
  Administered 2018-11-14: 60 mg via INTRAVENOUS
  Filled 2018-11-14: qty 30

## 2018-11-14 MED ORDER — SODIUM CHLORIDE 0.9 % IV SOLN
300.0000 mg/m2 | Freq: Once | INTRAVENOUS | Status: AC
  Administered 2018-11-14: 620 mg via INTRAVENOUS
  Filled 2018-11-14: qty 31

## 2018-11-14 MED ORDER — INFLUENZA VAC SPLIT QUAD 0.5 ML IM SUSY
0.5000 mL | PREFILLED_SYRINGE | Freq: Once | INTRAMUSCULAR | Status: AC
  Administered 2018-11-14: 0.5 mL via INTRAMUSCULAR

## 2018-11-14 MED ORDER — DEXAMETHASONE SODIUM PHOSPHATE 10 MG/ML IJ SOLN
INTRAMUSCULAR | Status: AC
  Filled 2018-11-14: qty 1

## 2018-11-14 MED ORDER — HYDROMORPHONE HCL 1 MG/ML IJ SOLN
INTRAMUSCULAR | Status: AC
  Filled 2018-11-14: qty 2

## 2018-11-14 NOTE — Progress Notes (Signed)
Ok to treat with creatinine of 2.65 per Dr [name]. dph

## 2018-11-14 NOTE — Telephone Encounter (Signed)
He has a current schedule through October. Thank you! Per 9/21 los

## 2018-11-14 NOTE — Patient Instructions (Signed)
Testosterone injection What is this medicine? TESTOSTERONE (tes TOS ter one) is the main male hormone. It supports normal male development such as muscle growth, facial hair, and deep voice. It is used in males to treat low testosterone levels. This medicine may be used for other purposes; ask your health care provider or pharmacist if you have questions. COMMON BRAND NAME(S): Andro-L.A., Aveed, Delatestryl, Depo-Testosterone, Virilon What should I tell my health care provider before I take this medicine? They need to know if you have any of these conditions:  cancer  diabetes  heart disease  kidney disease  liver disease  lung disease  prostate disease  an unusual or allergic reaction to testosterone, other medicines, foods, dyes, or preservatives  pregnant or trying to get pregnant  breast-feeding How should I use this medicine? This medicine is for injection into a muscle. It is usually given by a health care professional in a hospital or clinic setting. Contact your pediatrician regarding the use of this medicine in children. While this medicine may be prescribed for children as young as 54 years of age for selected conditions, precautions do apply. Overdosage: If you think you have taken too much of this medicine contact a poison control center or emergency room at once. NOTE: This medicine is only for you. Do not share this medicine with others. What if I miss a dose? Try not to miss a dose. Your doctor or health care professional will tell you when your next injection is due. Notify the office if you are unable to keep an appointment. What may interact with this medicine?  medicines for diabetes  medicines that treat or prevent blood clots like warfarin  oxyphenbutazone  propranolol  steroid medicines like prednisone or cortisone This list may not describe all possible interactions. Give your health care provider a list of all the medicines, herbs, non-prescription  drugs, or dietary supplements you use. Also tell them if you smoke, drink alcohol, or use illegal drugs. Some items may interact with your medicine. What should I watch for while using this medicine? Visit your doctor or health care professional for regular checks on your progress. They will need to check the level of testosterone in your blood. This medicine is only approved for use in men who have low levels of testosterone related to certain medical conditions. Heart attacks and strokes have been reported with the use of this medicine. Notify your doctor or health care professional and seek emergency treatment if you develop breathing problems; changes in vision; confusion; chest pain or chest tightness; sudden arm pain; severe, sudden headache; trouble speaking or understanding; sudden numbness or weakness of the face, arm or leg; loss of balance or coordination. Talk to your doctor about the risks and benefits of this medicine. This medicine may affect blood sugar levels. If you have diabetes, check with your doctor or health care professional before you change your diet or the dose of your diabetic medicine. Testosterone injections are not commonly used in women. Women should inform their doctor if they wish to become pregnant or think they might be pregnant. There is a potential for serious side effects to an unborn child. Talk to your health care professional or pharmacist for more information. Talk with your doctor or health care professional about your birth control options while taking this medicine. This drug is banned from use in athletes by most athletic organizations. What side effects may I notice from receiving this medicine? Side effects that you should report to  your doctor or health care professional as soon as possible:  allergic reactions like skin rash, itching or hives, swelling of the face, lips, or tongue  breast enlargement  breathing problems  changes in emotions or  moods  deep or hoarse voice  irregular menstrual periods  signs and symptoms of liver injury like dark yellow or brown urine; general ill feeling or flu-like symptoms; light-colored stools; loss of appetite; nausea; right upper belly pain; unusually weak or tired; yellowing of the eyes or skin  stomach pain  swelling of the ankles, feet, hands  too frequent or persistent erections  trouble passing urine or change in the amount of urine Side effects that usually do not require medical attention (report to your doctor or health care professional if they continue or are bothersome):  acne  change in sex drive or performance  facial hair growth  hair loss  headache This list may not describe all possible side effects. Call your doctor for medical advice about side effects. You may report side effects to FDA at 1-800-FDA-1088. Where should I keep my medicine? Keep out of the reach of children. This medicine can be abused. Keep your medicine in a safe place to protect it from theft. Do not share this medicine with anyone. Selling or giving away this medicine is dangerous and against the law. Store at room temperature between 20 and 25 degrees C (68 and 77 degrees F). Do not freeze. Protect from light. Follow the directions for the product you are prescribed. Throw away any unused medicine after the expiration date. NOTE: This sheet is a summary. It may not cover all possible information. If you have questions about this medicine, talk to your doctor, pharmacist, or health care provider.  2020 Elsevier/Gold Standard (2015-03-16 07:33:55) Cyclophosphamide injection What is this medicine? CYCLOPHOSPHAMIDE (sye kloe FOSS fa mide) is a chemotherapy drug. It slows the growth of cancer cells. This medicine is used to treat many types of cancer like lymphoma, myeloma, leukemia, breast cancer, and ovarian cancer, to name a few. This medicine may be used for other purposes; ask your health care  provider or pharmacist if you have questions. COMMON BRAND NAME(S): Cytoxan, Neosar What should I tell my health care provider before I take this medicine? They need to know if you have any of these conditions:  blood disorders  history of other chemotherapy  infection  kidney disease  liver disease  recent or ongoing radiation therapy  tumors in the bone marrow  an unusual or allergic reaction to cyclophosphamide, other chemotherapy, other medicines, foods, dyes, or preservatives  pregnant or trying to get pregnant  breast-feeding How should I use this medicine? This drug is usually given as an injection into a vein or muscle or by infusion into a vein. It is administered in a hospital or clinic by a specially trained health care professional. Talk to your pediatrician regarding the use of this medicine in children. Special care may be needed. Overdosage: If you think you have taken too much of this medicine contact a poison control center or emergency room at once. NOTE: This medicine is only for you. Do not share this medicine with others. What if I miss a dose? It is important not to miss your dose. Call your doctor or health care professional if you are unable to keep an appointment. What may interact with this medicine? This medicine may interact with the following medications:  amiodarone  amphotericin B  azathioprine  certain antiviral medicines  for HIV or AIDS such as protease inhibitors (e.g., indinavir, ritonavir) and zidovudine  certain blood pressure medications such as benazepril, captopril, enalapril, fosinopril, lisinopril, moexipril, monopril, perindopril, quinapril, ramipril, trandolapril  certain cancer medications such as anthracyclines (e.g., daunorubicin, doxorubicin), busulfan, cytarabine, paclitaxel, pentostatin, tamoxifen, trastuzumab  certain diuretics such as chlorothiazide, chlorthalidone, hydrochlorothiazide, indapamide, metolazone  certain  medicines that treat or prevent blood clots like warfarin  certain muscle relaxants such as succinylcholine  cyclosporine  etanercept  indomethacin  medicines to increase blood counts like filgrastim, pegfilgrastim, sargramostim  medicines used as general anesthesia  metronidazole  natalizumab This list may not describe all possible interactions. Give your health care provider a list of all the medicines, herbs, non-prescription drugs, or dietary supplements you use. Also tell them if you smoke, drink alcohol, or use illegal drugs. Some items may interact with your medicine. What should I watch for while using this medicine? Visit your doctor for checks on your progress. This drug may make you feel generally unwell. This is not uncommon, as chemotherapy can affect healthy cells as well as cancer cells. Report any side effects. Continue your course of treatment even though you feel ill unless your doctor tells you to stop. Drink water or other fluids as directed. Urinate often, even at night. In some cases, you may be given additional medicines to help with side effects. Follow all directions for their use. Call your doctor or health care professional for advice if you get a fever, chills or sore throat, or other symptoms of a cold or flu. Do not treat yourself. This drug decreases your body's ability to fight infections. Try to avoid being around people who are sick. This medicine may increase your risk to bruise or bleed. Call your doctor or health care professional if you notice any unusual bleeding. Be careful brushing and flossing your teeth or using a toothpick because you may get an infection or bleed more easily. If you have any dental work done, tell your dentist you are receiving this medicine. You may get drowsy or dizzy. Do not drive, use machinery, or do anything that needs mental alertness until you know how this medicine affects you. Do not become pregnant while taking this  medicine or for 1 year after stopping it. Women should inform their doctor if they wish to become pregnant or think they might be pregnant. Men should not father a child while taking this medicine and for 4 months after stopping it. There is a potential for serious side effects to an unborn child. Talk to your health care professional or pharmacist for more information. Do not breast-feed an infant while taking this medicine. This medicine may interfere with the ability to have a child. This medicine has caused ovarian failure in some women. This medicine has caused reduced sperm counts in some men. You should talk with your doctor or health care professional if you are concerned about your fertility. If you are going to have surgery, tell your doctor or health care professional that you have taken this medicine. What side effects may I notice from receiving this medicine? Side effects that you should report to your doctor or health care professional as soon as possible:  allergic reactions like skin rash, itching or hives, swelling of the face, lips, or tongue  low blood counts - this medicine may decrease the number of white blood cells, red blood cells and platelets. You may be at increased risk for infections and bleeding.  signs of infection -  fever or chills, cough, sore throat, pain or difficulty passing urine  signs of decreased platelets or bleeding - bruising, pinpoint red spots on the skin, black, tarry stools, blood in the urine  signs of decreased red blood cells - unusually weak or tired, fainting spells, lightheadedness  breathing problems  dark urine  dizziness  palpitations  swelling of the ankles, feet, hands  trouble passing urine or change in the amount of urine  weight gain  yellowing of the eyes or skin Side effects that usually do not require medical attention (report to your doctor or health care professional if they continue or are bothersome):  changes in  nail or skin color  hair loss  missed menstrual periods  mouth sores  nausea, vomiting This list may not describe all possible side effects. Call your doctor for medical advice about side effects. You may report side effects to FDA at 1-800-FDA-1088. Where should I keep my medicine? This drug is given in a hospital or clinic and will not be stored at home. NOTE: This sheet is a summary. It may not cover all possible information. If you have questions about this medicine, talk to your doctor, pharmacist, or health care provider.  2020 Elsevier/Gold Standard (2011-12-25 16:22:58) Carfilzomib injection What is this medicine? CARFILZOMIB (kar FILZ oh mib) targets a specific protein within cancer cells and stops the cancer cells from growing. It is used to treat multiple myeloma. This medicine may be used for other purposes; ask your health care provider or pharmacist if you have questions. COMMON BRAND NAME(S): KYPROLIS What should I tell my health care provider before I take this medicine? They need to know if you have any of these conditions:  heart disease  history of blood clots  irregular heartbeat  kidney disease  liver disease  lung or breathing disease  an unusual or allergic reaction to carfilzomib, or other medicines, foods, dyes, or preservatives  pregnant or trying to get pregnant  breast-feeding How should I use this medicine? This medicine is for injection or infusion into a vein. It is given by a health care professional in a hospital or clinic setting. Talk to your pediatrician regarding the use of this medicine in children. Special care may be needed. Overdosage: If you think you have taken too much of this medicine contact a poison control center or emergency room at once. NOTE: This medicine is only for you. Do not share this medicine with others. What if I miss a dose? It is important not to miss your dose. Call your doctor or health care professional if  you are unable to keep an appointment. What may interact with this medicine? Interactions are not expected. Give your health care provider a list of all the medicines, herbs, non-prescription drugs, or dietary supplements you use. Also tell them if you smoke, drink alcohol, or use illegal drugs. Some items may interact with your medicine. This list may not describe all possible interactions. Give your health care provider a list of all the medicines, herbs, non-prescription drugs, or dietary supplements you use. Also tell them if you smoke, drink alcohol, or use illegal drugs. Some items may interact with your medicine. What should I watch for while using this medicine? Your condition will be monitored carefully while you are receiving this medicine. Report any side effects. Continue your course of treatment even though you feel ill unless your doctor tells you to stop. You may need blood work done while you are taking this medicine.  Do not become pregnant while taking this medicine or for at least 6 months after stopping it. Women should inform their doctor if they wish to become pregnant or think they might be pregnant. There is a potential for serious side effects to an unborn child. Men should not father a child while taking this medicine and for at least 3 months after stopping it. Talk to your health care professional or pharmacist for more information. Do not breast-feed an infant while taking this medicine or for 2 weeks after the last dose. Check with your doctor or health care professional if you get an attack of severe diarrhea, nausea and vomiting, or if you sweat a lot. The loss of too much body fluid can make it dangerous for you to take this medicine. You may get dizzy. Do not drive, use machinery, or do anything that needs mental alertness until you know how this medicine affects you. Do not stand or sit up quickly, especially if you are an older patient. This reduces the risk of dizzy or  fainting spells. What side effects may I notice from receiving this medicine? Side effects that you should report to your doctor or health care professional as soon as possible:  allergic reactions like skin rash, itching or hives, swelling of the face, lips, or tongue  confusion  dizziness  feeling faint or lightheaded  fever or chills  palpitations  seizures  signs and symptoms of bleeding such as bloody or black, tarry stools; red or dark-brown urine; spitting up blood or brown material that looks like coffee grounds; red spots on the skin; unusual bruising or bleeding including from the eye, gums, or nose  signs and symptoms of a blood clot such as breathing problems; changes in vision; chest pain; severe, sudden headache; pain, swelling, warmth in the leg; trouble speaking; sudden numbness or weakness of the face, arm or leg  signs and symptoms of kidney injury like trouble passing urine or change in the amount of urine  signs and symptoms of liver injury like dark yellow or brown urine; general ill feeling or flu-like symptoms; light-colored stools; loss of appetite; nausea; right upper belly pain; unusually weak or tired; yellowing of the eyes or skin Side effects that usually do not require medical attention (report to your doctor or health care professional if they continue or are bothersome):  back pain  cough  diarrhea  headache  muscle cramps  vomiting This list may not describe all possible side effects. Call your doctor for medical advice about side effects. You may report side effects to FDA at 1-800-FDA-1088. Where should I keep my medicine? This drug is given in a hospital or clinic and will not be stored at home. NOTE: This sheet is a summary. It may not cover all possible information. If you have questions about this medicine, talk to your doctor, pharmacist, or health care provider.  2020 Elsevier/Gold Standard (2016-11-25 14:07:13)

## 2018-11-14 NOTE — Patient Instructions (Signed)

## 2018-11-14 NOTE — Progress Notes (Signed)
Hematology and Oncology Follow Up Visit  [name] [id number] Aug 14, 1950 68 y.o. 11/14/2018   Principle Diagnosis:  IgG kappa myeloma  Anemia secondary to renal insufficiency  Intermittent iron - deficiency anemia  Hypotestosteronemia  Current Therapy:   Aredia 60 mg IV q 3 months-- next dose 12/2018 Aranesp 300 mcg subcu as needed for hemoglobin less than 11 DepoTestosterone400 mg q4weeks IV iron as indicated Kyprolis/Cytoxan - s/p cycle20   Interim History:  [name] is here today for follow-up and treatment. He is doing fairly well but states that his bone and joint pain has increased. He has extensive arthritis and fibromyalgia.  He states that he got a steroid injection in his back last week which seems to help some.  In August, no M-spike was detected. IgG level was 903 mg/dL and kappa light chains were 3.37 mg/dL.  He has some dizziness and fatigue at times. He states that he never has much energy.  SOB with over exertion is unchanged. He will take a break to rest when needed.  No fever, chills, n/v, cough, rash, chest pain, palpitations, abdominal pain or changes in bowel or bladder habits.  He has persistent constipation and would like to refill his Lactulose.  The swelling in his feet and ankles is significantly improved. Pedal pulses are 2+.  The numbness and tingling in his hands and feet is mild and unchanged.  No falls or syncopal episodes to report.  He states that his appetite comes and goes. He is staying well hydrated. His weight is down 6 lbs.   ECOG Performance Status: 1 - Symptomatic but completely ambulatory  Medications:  Allergies as of 11/14/2018      Reactions   Iodinated Diagnostic Agents Rash   Patient states he was instructed not to take IV contrast.  In 2008 he had an unknown reaction, and was told not to take it again.  He was also told not to take it due to his kidneys.   Iodine Anxiety, Rash, Other (See Comments)   Didn't feel  right "instructed not to take per MD--something with his port"      Medication List       Accurate as of November 14, 2018  8:57 AM. If you have any questions, ask your nurse or doctor.        amLODipine 5 MG tablet Commonly known as: NORVASC Take 5 mg by mouth daily.   atorvastatin 40 MG tablet Commonly known as: LIPITOR daily.   Carafate 1 GM/10ML suspension Generic drug: sucralfate Take 1 g by mouth 4 (four) times daily.   Clear Eyes All Seasons 5-6 MG/ML Soln Generic drug: Polyvinyl Alcohol-Povidone Place 2 drops into both eyes 3 (three) times daily as needed (drye eyes.).   cyclobenzaprine 10 MG tablet Commonly known as: FLEXERIL Take 10 mg by mouth 3 (three) times daily.   dicyclomine 10 MG capsule Commonly known as: BENTYL TAKE 1 CAPSULE BY MOUTH THREE TIMES A DAY   dronabinol 2.5 MG capsule Commonly known as: MARINOL Take 1 capsule (2.5 mg total) by mouth 2 (two) times daily before a meal.   Embeda 30-1.2 MG Cpcr Generic drug: Morphine-Naltrexone Take 3 (three) times daily by mouth.   Embeda 20-0.8 MG Cpcr Generic drug: Morphine-Naltrexone Take 1 capsule by mouth 3 (three) times daily.   furosemide 40 MG tablet Commonly known as: LASIX Take 40 mg by mouth daily.   gabapentin 300 MG capsule Commonly known as: NEURONTIN TAKE 1 CAPSULE BY MOUTH THREE TIMES  A DAY   glipiZIDE 5 MG tablet Commonly known as: GLUCOTROL Take 5 mg by mouth 2 (two) times daily before a meal.   HYDROcodone-acetaminophen 5-325 MG tablet Commonly known as: Norco Take 1-2 tablets by mouth every 6 (six) hours as needed. What changed: reasons to take this   lactulose 10 GM/15ML solution Commonly known as: CHRONULAC Take 20 g by mouth daily.   latanoprost 0.005 % ophthalmic solution Commonly known as: XALATAN Place 1 drop into the right eye every evening.   lidocaine-prilocaine cream Commonly known as: EMLA Apply 1 application topically as needed.   linaclotide 145  MCG Caps capsule Commonly known as: LINZESS Take 1 capsule (145 mcg total) by mouth daily before breakfast. What changed:   when to take this  reasons to take this   LORazepam 0.5 MG tablet Commonly known as: Ativan Take 1 tablet (0.5 mg total) by mouth every 6 (six) hours as needed (Nausea or vomiting).   losartan 25 MG tablet Commonly known as: COZAAR Take 25 mg by mouth.   mesalamine 1.2 g EC tablet Commonly known as: Lialda Take 1 tablet (1.2 g total) by mouth daily with breakfast.   metoprolol succinate 100 MG 24 hr tablet Commonly known as: TOPROL-XL Take 100 mg by mouth daily.   mirabegron ER 50 MG Tb24 tablet Commonly known as: MYRBETRIQ Take 50 mg by mouth daily.   morphine 30 MG 12 hr tablet Commonly known as: MS CONTIN Take 30 mg by mouth 2 (two) times daily.   omeprazole 40 MG capsule Commonly known as: PRILOSEC Take 1 capsule (40 mg total) by mouth 2 (two) times daily.   ondansetron 8 MG tablet Commonly known as: ZOFRAN TAKE 1 TABLET(8 MG) BY MOUTH TWICE DAILY AS NEEDED FOR NAUSEA OR VOMITING What changed: See the new instructions.   orphenadrine 100 MG tablet Commonly known as: NORFLEX Take 1 tablet (100 mg total) by mouth 2 (two) times daily.   oxyCODONE-acetaminophen 10-325 MG tablet Commonly known as: PERCOCET Take 1 tablet by mouth every 6 (six) hours as needed for pain.   pioglitazone 30 MG tablet Commonly known as: ACTOS Take 30 mg by mouth daily.   potassium chloride SA 20 MEQ tablet Commonly known as: K-DUR Take 1 tablet (20 mEq total) by mouth 2 (two) times daily.   prochlorperazine 10 MG tablet Commonly known as: COMPAZINE take 1 tablet by mouth every 6 hours if needed for nausea and vomiting What changed:   how much to take  how to take this  when to take this  reasons to take this   Relistor 150 MG Tabs Generic drug: Methylnaltrexone Bromide Take 150 mg by mouth.   Sennosides 25 MG Tabs Take 25 mg by mouth daily as  needed (constipation).   Vitamin D (Ergocalciferol) 1.25 MG (50000 UT) Caps capsule Commonly known as: DRISDOL Take 50,000 Units by mouth every Saturday.   Xtampza ER 13.5 MG C12a Generic drug: oxyCODONE ER Take 1 capsule by mouth 2 (two) times daily.   Zioptan 0.0015 % Soln Generic drug: Tafluprost (PF) Place 1 drop into both eyes daily.       Allergies:  Allergies  Allergen Reactions   Iodinated Diagnostic Agents Rash    Patient states he was instructed not to take IV contrast.  In 2008 he had an unknown reaction, and was told not to take it again.  He was also told not to take it due to his kidneys.   Iodine Anxiety, Rash and  Other (See Comments)    Didn't feel right "instructed not to take per MD--something with his port"     Past Medical History, Surgical history, Social history, and Family History were reviewed and updated.  Review of Systems: All other 10 point review of systems is negative.   Physical Exam:  vitals were not taken for this visit.   Wt Readings from Last 3 Encounters:  10/24/18 202 lb (91.6 kg)  10/10/18 199 lb 1.3 oz (90.3 kg)  09/26/18 202 lb (91.6 kg)    Ocular: Sclerae unicteric, pupils equal, round and reactive to light Ear-nose-throat: Oropharynx clear, dentition fair Lymphatic: No cervical or supraclavicular adenopathy Lungs no rales or rhonchi, good excursion bilaterally Heart regular rate and rhythm, no murmur appreciated Abd soft, nontender, positive bowel sounds, no liver or spleen tip palpated on exam, no fluid wave  MSK no focal spinal tenderness, no joint edema Neuro: non-focal, well-oriented, appropriate affect Breasts: Deferred   Lab Results  Component Value Date   WBC 4.6 11/14/2018   HGB 12.1 (L) 11/14/2018   HCT 38.7 (L) 11/14/2018   MCV 82.7 11/14/2018   PLT 158 11/14/2018   Lab Results  Component Value Date   FERRITIN 1,437 (H) 10/24/2018   IRON 66 10/24/2018   TIBC 230 10/24/2018   UIBC 164 10/24/2018    IRONPCTSAT 29 10/24/2018   Lab Results  Component Value Date   RETICCTPCT 0.6 07/25/2018   RBC 4.68 11/14/2018   RETICCTABS 27.5 03/21/2014   Lab Results  Component Value Date   KPAFRELGTCHN 33.7 (H) 10/24/2018   LAMBDASER 23.1 10/24/2018   KAPLAMBRATIO 1.46 10/24/2018   Lab Results  Component Value Date   IGGSERUM 903 10/24/2018   IGA 158 10/24/2018   IGMSERUM 28 10/24/2018   Lab Results  Component Value Date   TOTALPROTELP 6.4 10/24/2018   ALBUMINELP 3.8 10/24/2018   A1GS 0.2 10/24/2018   A2GS 0.9 10/24/2018   BETS 0.8 10/24/2018   BETA2SER 0.5 11/07/2014   GAMS 0.7 10/24/2018   MSPIKE Not Observed 10/24/2018   SPEI Comment 10/24/2018     Chemistry      Component Value Date/Time   NA 141 11/14/2018 0823   NA 143 02/22/2017 0744   K 3.7 11/14/2018 0823   K 4.0 02/22/2017 0744   CL 103 11/14/2018 0823   CL 106 02/22/2017 0744   CO2 29 11/14/2018 0823   CO2 27 02/22/2017 0744   BUN 40 (H) 11/14/2018 0823   BUN 26 (H) 02/22/2017 0744   CREATININE 2.64 (H) 11/14/2018 0823   CREATININE 2.3 (H) 02/22/2017 0744      Component Value Date/Time   CALCIUM 9.3 11/14/2018 0823   CALCIUM 9.3 02/22/2017 0744   ALKPHOS 73 11/14/2018 0823   ALKPHOS 80 02/22/2017 0744   AST 18 11/14/2018 0823   ALT 19 11/14/2018 0823   ALT 22 02/22/2017 0744   BILITOT 0.7 11/14/2018 0823       Impression and Plan: Mr. Si is a very pleasant 68 yo African American gentleman with IgG kappa myeloma.  His myeloma studies have remained stable.  We will proceed with treatment today as planned.  We will see what his testosterone looks like and replace if no longer high. I refilled his Lactulose prescription.  We will also have him try Marinol to stimulate his appetite.  We will plan to see him back in another 3 weeks.  He will contact our office with any questions or concerns. We can certainly see  him sooner if needed.   Laverna Peace, NP 9/21/20208:57 AM

## 2018-11-15 LAB — IGG, IGA, IGM
IgA: 166 mg/dL (ref 61–437)
IgG (Immunoglobin G), Serum: 1007 mg/dL (ref 603–1613)
IgM (Immunoglobulin M), Srm: 29 mg/dL (ref 20–172)

## 2018-11-15 LAB — KAPPA/LAMBDA LIGHT CHAINS
Kappa free light chain: 35.6 mg/L — ABNORMAL HIGH (ref 3.3–19.4)
Kappa, lambda light chain ratio: 1.81 — ABNORMAL HIGH (ref 0.26–1.65)
Lambda free light chains: 19.7 mg/L (ref 5.7–26.3)

## 2018-11-15 LAB — TESTOSTERONE: Testosterone: 239 ng/dL — ABNORMAL LOW (ref 264–916)

## 2018-11-16 LAB — PROTEIN ELECTROPHORESIS, SERUM, WITH REFLEX
A/G Ratio: 1.4 (ref 0.7–1.7)
Albumin ELP: 3.9 g/dL (ref 2.9–4.4)
Alpha-1-Globulin: 0.2 g/dL (ref 0.0–0.4)
Alpha-2-Globulin: 0.9 g/dL (ref 0.4–1.0)
Beta Globulin: 0.9 g/dL (ref 0.7–1.3)
Gamma Globulin: 0.7 g/dL (ref 0.4–1.8)
Globulin, Total: 2.8 g/dL (ref 2.2–3.9)
Total Protein ELP: 6.7 g/dL (ref 6.0–8.5)

## 2018-12-05 ENCOUNTER — Other Ambulatory Visit: Payer: Self-pay

## 2018-12-05 ENCOUNTER — Inpatient Hospital Stay: Payer: Medicare Other

## 2018-12-05 ENCOUNTER — Inpatient Hospital Stay: Payer: Medicare Other | Attending: Hematology & Oncology | Admitting: Family

## 2018-12-05 ENCOUNTER — Telehealth: Payer: Self-pay | Admitting: Hematology & Oncology

## 2018-12-05 ENCOUNTER — Encounter: Payer: Self-pay | Admitting: Family

## 2018-12-05 DIAGNOSIS — Z5112 Encounter for antineoplastic immunotherapy: Secondary | ICD-10-CM | POA: Diagnosis present

## 2018-12-05 DIAGNOSIS — M4712 Other spondylosis with myelopathy, cervical region: Secondary | ICD-10-CM

## 2018-12-05 DIAGNOSIS — E349 Endocrine disorder, unspecified: Secondary | ICD-10-CM

## 2018-12-05 DIAGNOSIS — D508 Other iron deficiency anemias: Secondary | ICD-10-CM | POA: Diagnosis not present

## 2018-12-05 DIAGNOSIS — N189 Chronic kidney disease, unspecified: Secondary | ICD-10-CM | POA: Diagnosis not present

## 2018-12-05 DIAGNOSIS — D631 Anemia in chronic kidney disease: Secondary | ICD-10-CM | POA: Diagnosis not present

## 2018-12-05 DIAGNOSIS — C9000 Multiple myeloma not having achieved remission: Secondary | ICD-10-CM

## 2018-12-05 DIAGNOSIS — Z5111 Encounter for antineoplastic chemotherapy: Secondary | ICD-10-CM | POA: Insufficient documentation

## 2018-12-05 DIAGNOSIS — R5383 Other fatigue: Secondary | ICD-10-CM | POA: Insufficient documentation

## 2018-12-05 DIAGNOSIS — Z299 Encounter for prophylactic measures, unspecified: Secondary | ICD-10-CM

## 2018-12-05 DIAGNOSIS — R42 Dizziness and giddiness: Secondary | ICD-10-CM | POA: Diagnosis not present

## 2018-12-05 LAB — CMP (CANCER CENTER ONLY)
ALT: 15 U/L (ref 0–44)
AST: 19 U/L (ref 15–41)
Albumin: 4.1 g/dL (ref 3.5–5.0)
Alkaline Phosphatase: 64 U/L (ref 38–126)
Anion gap: 9 (ref 5–15)
BUN: 19 mg/dL (ref 8–23)
CO2: 30 mmol/L (ref 22–32)
Calcium: 8.9 mg/dL (ref 8.9–10.3)
Chloride: 103 mmol/L (ref 98–111)
Creatinine: 2.41 mg/dL — ABNORMAL HIGH (ref 0.61–1.24)
GFR, Est AFR Am: 31 mL/min — ABNORMAL LOW (ref >60)
GFR, Estimated: 26 mL/min — ABNORMAL LOW (ref >60)
Glucose, Bld: 102 mg/dL — ABNORMAL HIGH (ref 70–99)
Potassium: 3.2 mmol/L — ABNORMAL LOW (ref 3.5–5.1)
Sodium: 142 mmol/L (ref 135–145)
Total Bilirubin: 0.6 mg/dL (ref 0.3–1.2)
Total Protein: 6.7 g/dL (ref 6.5–8.1)

## 2018-12-05 LAB — CBC WITH DIFFERENTIAL (CANCER CENTER ONLY)
Abs Immature Granulocytes: 0.01 10*3/uL (ref 0.00–0.07)
Basophils Absolute: 0 10*3/uL (ref 0.0–0.1)
Basophils Relative: 1 %
Eosinophils Absolute: 0.2 10*3/uL (ref 0.0–0.5)
Eosinophils Relative: 4 %
HCT: 33.4 % — ABNORMAL LOW (ref 39.0–52.0)
Hemoglobin: 10.6 g/dL — ABNORMAL LOW (ref 13.0–17.0)
Immature Granulocytes: 0 %
Lymphocytes Relative: 16 %
Lymphs Abs: 0.6 10*3/uL — ABNORMAL LOW (ref 0.7–4.0)
MCH: 26.6 pg (ref 26.0–34.0)
MCHC: 31.7 g/dL (ref 30.0–36.0)
MCV: 83.9 fL (ref 80.0–100.0)
Monocytes Absolute: 0.6 10*3/uL (ref 0.1–1.0)
Monocytes Relative: 16 %
Neutro Abs: 2.4 10*3/uL (ref 1.7–7.7)
Neutrophils Relative %: 63 %
Platelet Count: 137 10*3/uL — ABNORMAL LOW (ref 150–400)
RBC: 3.98 MIL/uL — ABNORMAL LOW (ref 4.22–5.81)
RDW: 17.2 % — ABNORMAL HIGH (ref 11.5–15.5)
WBC Count: 3.7 10*3/uL — ABNORMAL LOW (ref 4.0–10.5)
nRBC: 0 % (ref 0.0–0.2)

## 2018-12-05 LAB — IRON AND TIBC
Iron: 86 ug/dL (ref 42–163)
Saturation Ratios: 37 % (ref 20–55)
TIBC: 235 ug/dL (ref 202–409)
UIBC: 149 ug/dL (ref 117–376)

## 2018-12-05 LAB — FERRITIN: Ferritin: 1458 ng/mL — ABNORMAL HIGH (ref 24–336)

## 2018-12-05 MED ORDER — DARBEPOETIN ALFA 300 MCG/0.6ML IJ SOSY
PREFILLED_SYRINGE | INTRAMUSCULAR | Status: AC
  Filled 2018-12-05: qty 0.6

## 2018-12-05 MED ORDER — SODIUM CHLORIDE 0.9 % IV SOLN
Freq: Once | INTRAVENOUS | Status: AC
  Administered 2018-12-05: 09:00:00 via INTRAVENOUS
  Filled 2018-12-05: qty 250

## 2018-12-05 MED ORDER — DEXAMETHASONE SODIUM PHOSPHATE 10 MG/ML IJ SOLN
INTRAMUSCULAR | Status: AC
  Filled 2018-12-05: qty 1

## 2018-12-05 MED ORDER — SODIUM CHLORIDE 0.9 % IV SOLN
300.0000 mg/m2 | Freq: Once | INTRAVENOUS | Status: AC
  Administered 2018-12-05: 620 mg via INTRAVENOUS
  Filled 2018-12-05: qty 31

## 2018-12-05 MED ORDER — PALONOSETRON HCL INJECTION 0.25 MG/5ML
0.2500 mg | Freq: Once | INTRAVENOUS | Status: AC
  Administered 2018-12-05: 0.25 mg via INTRAVENOUS

## 2018-12-05 MED ORDER — HYDROMORPHONE HCL 1 MG/ML IJ SOLN
INTRAMUSCULAR | Status: AC
  Filled 2018-12-05: qty 2

## 2018-12-05 MED ORDER — SODIUM CHLORIDE 0.9% FLUSH
10.0000 mL | INTRAVENOUS | Status: DC | PRN
  Administered 2018-12-05: 10 mL
  Filled 2018-12-05: qty 10

## 2018-12-05 MED ORDER — DEXTROSE 5 % IV SOLN
29.0000 mg/m2 | Freq: Once | INTRAVENOUS | Status: AC
  Administered 2018-12-05: 60 mg via INTRAVENOUS
  Filled 2018-12-05: qty 30

## 2018-12-05 MED ORDER — DARBEPOETIN ALFA 300 MCG/0.6ML IJ SOSY
300.0000 ug | PREFILLED_SYRINGE | Freq: Once | INTRAMUSCULAR | Status: AC
  Administered 2018-12-05: 300 ug via SUBCUTANEOUS

## 2018-12-05 MED ORDER — SODIUM CHLORIDE 0.9 % IV SOLN
60.0000 mg | Freq: Once | INTRAVENOUS | Status: AC
  Administered 2018-12-05: 60 mg via INTRAVENOUS
  Filled 2018-12-05: qty 10

## 2018-12-05 MED ORDER — DEXAMETHASONE SODIUM PHOSPHATE 10 MG/ML IJ SOLN
10.0000 mg | Freq: Once | INTRAMUSCULAR | Status: AC
  Administered 2018-12-05: 10 mg via INTRAVENOUS

## 2018-12-05 MED ORDER — PALONOSETRON HCL INJECTION 0.25 MG/5ML
INTRAVENOUS | Status: AC
  Filled 2018-12-05: qty 5

## 2018-12-05 MED ORDER — HEPARIN SOD (PORK) LOCK FLUSH 100 UNIT/ML IV SOLN
500.0000 [IU] | Freq: Once | INTRAVENOUS | Status: AC | PRN
  Administered 2018-12-05: 500 [IU]
  Filled 2018-12-05: qty 5

## 2018-12-05 MED ORDER — HYDROMORPHONE HCL 1 MG/ML IJ SOLN
2.0000 mg | Freq: Once | INTRAMUSCULAR | Status: AC
  Administered 2018-12-05: 2 mg via INTRAVENOUS

## 2018-12-05 MED ORDER — SODIUM CHLORIDE 0.9 % IV SOLN
Freq: Once | INTRAVENOUS | Status: AC
  Filled 2018-12-05: qty 250

## 2018-12-05 NOTE — Telephone Encounter (Signed)
Appointments previously scheduled as requested per 10/12 los

## 2018-12-05 NOTE — Patient Instructions (Signed)
Pamidronate injection What is this medicine? PAMIDRONATE (pa mi DROE nate) slows calcium loss from bones. It is used to treat high calcium blood levels from cancer or Paget's disease. It is also used to treat bone pain and prevent fractures from certain cancers that have spread to the bone. This medicine may be used for other purposes; ask your health care provider or pharmacist if you have questions. COMMON BRAND NAME(S): Aredia What should I tell my health care provider before I take this medicine? They need to know if you have any of these conditions:  aspirin-sensitive asthma  dental disease  kidney disease  an unusual or allergic reaction to pamidronate, other medicines, foods, dyes, or preservatives  pregnant or trying to get pregnant  breast-feeding How should I use this medicine? This medicine is for infusion into a vein. It is given by a health care professional in a hospital or clinic setting. Talk to your pediatrician regarding the use of this medicine in children. This medicine is not approved for use in children. Overdosage: If you think you have taken too much of this medicine contact a poison control center or emergency room at once. NOTE: This medicine is only for you. Do not share this medicine with others. What if I miss a dose? This does not apply. What may interact with this medicine?  certain antibiotics given by injection  medicines for inflammation or pain like ibuprofen, naproxen  some diuretics like bumetanide, furosemide  cyclosporine  parathyroid hormone  tacrolimus  teriparatide  thalidomide This list may not describe all possible interactions. Give your health care provider a list of all the medicines, herbs, non-prescription drugs, or dietary supplements you use. Also tell them if you smoke, drink alcohol, or use illegal drugs. Some items may interact with your medicine. What should I watch for while using this medicine? Visit your doctor or  health care professional for regular checkups. It may be some time before you see the benefit from this medicine. Do not stop taking your medicine unless your doctor tells you to. Your doctor may order blood tests or other tests to see how you are doing. Women should inform their doctor if they wish to become pregnant or think they might be pregnant. There is a potential for serious side effects to an unborn child. Talk to your health care professional or pharmacist for more information. You should make sure that you get enough calcium and vitamin D while you are taking this medicine. Discuss the foods you eat and the vitamins you take with your health care professional. Some people who take this medicine have severe bone, joint, and/or muscle pain. This medicine may also increase your risk for a broken thigh bone. Tell your doctor right away if you have pain in your upper leg or groin. Tell your doctor if you have any pain that does not go away or that gets worse. What side effects may I notice from receiving this medicine? Side effects that you should report to your doctor or health care professional as soon as possible:  allergic reactions like skin rash, itching or hives, swelling of the face, lips, or tongue  black or tarry stools  changes in vision  eye inflammation, pain  high blood pressure  jaw pain, especially burning or cramping  muscle weakness  numb, tingling pain  swelling of feet or hands  trouble passing urine or change in the amount of urine  unable to move easily Side effects that usually do not  require medical attention (report to your doctor or health care professional if they continue or are bothersome):  bone, joint, or muscle pain  constipation  dizzy, drowsy  fever  headache  loss of appetite  nausea, vomiting  pain at site where injected This list may not describe all possible side effects. Call your doctor for medical advice about side effects.  You may report side effects to FDA at 1-800-FDA-1088. Where should I keep my medicine? This drug is given in a hospital or clinic and will not be stored at home. NOTE: This sheet is a summary. It may not cover all possible information. If you have questions about this medicine, talk to your doctor, pharmacist, or health care provider.  2020 Elsevier/Gold Standard (2010-08-08 08:49:49) Cyclophosphamide injection What is this medicine? CYCLOPHOSPHAMIDE (sye kloe FOSS fa mide) is a chemotherapy drug. It slows the growth of cancer cells. This medicine is used to treat many types of cancer like lymphoma, myeloma, leukemia, breast cancer, and ovarian cancer, to name a few. This medicine may be used for other purposes; ask your health care provider or pharmacist if you have questions. COMMON BRAND NAME(S): Cytoxan, Neosar What should I tell my health care provider before I take this medicine? They need to know if you have any of these conditions:  blood disorders  history of other chemotherapy  infection  kidney disease  liver disease  recent or ongoing radiation therapy  tumors in the bone marrow  an unusual or allergic reaction to cyclophosphamide, other chemotherapy, other medicines, foods, dyes, or preservatives  pregnant or trying to get pregnant  breast-feeding How should I use this medicine? This drug is usually given as an injection into a vein or muscle or by infusion into a vein. It is administered in a hospital or clinic by a specially trained health care professional. Talk to your pediatrician regarding the use of this medicine in children. Special care may be needed. Overdosage: If you think you have taken too much of this medicine contact a poison control center or emergency room at once. NOTE: This medicine is only for you. Do not share this medicine with others. What if I miss a dose? It is important not to miss your dose. Call your doctor or health care professional  if you are unable to keep an appointment. What may interact with this medicine? This medicine may interact with the following medications:  amiodarone  amphotericin B  azathioprine  certain antiviral medicines for HIV or AIDS such as protease inhibitors (e.g., indinavir, ritonavir) and zidovudine  certain blood pressure medications such as benazepril, captopril, enalapril, fosinopril, lisinopril, moexipril, monopril, perindopril, quinapril, ramipril, trandolapril  certain cancer medications such as anthracyclines (e.g., daunorubicin, doxorubicin), busulfan, cytarabine, paclitaxel, pentostatin, tamoxifen, trastuzumab  certain diuretics such as chlorothiazide, chlorthalidone, hydrochlorothiazide, indapamide, metolazone  certain medicines that treat or prevent blood clots like warfarin  certain muscle relaxants such as succinylcholine  cyclosporine  etanercept  indomethacin  medicines to increase blood counts like filgrastim, pegfilgrastim, sargramostim  medicines used as general anesthesia  metronidazole  natalizumab This list may not describe all possible interactions. Give your health care provider a list of all the medicines, herbs, non-prescription drugs, or dietary supplements you use. Also tell them if you smoke, drink alcohol, or use illegal drugs. Some items may interact with your medicine. What should I watch for while using this medicine? Visit your doctor for checks on your progress. This drug may make you feel generally unwell. This is not  uncommon, as chemotherapy can affect healthy cells as well as cancer cells. Report any side effects. Continue your course of treatment even though you feel ill unless your doctor tells you to stop. Drink water or other fluids as directed. Urinate often, even at night. In some cases, you may be given additional medicines to help with side effects. Follow all directions for their use. Call your doctor or health care professional for  advice if you get a fever, chills or sore throat, or other symptoms of a cold or flu. Do not treat yourself. This drug decreases your body's ability to fight infections. Try to avoid being around people who are sick. This medicine may increase your risk to bruise or bleed. Call your doctor or health care professional if you notice any unusual bleeding. Be careful brushing and flossing your teeth or using a toothpick because you may get an infection or bleed more easily. If you have any dental work done, tell your dentist you are receiving this medicine. You may get drowsy or dizzy. Do not drive, use machinery, or do anything that needs mental alertness until you know how this medicine affects you. Do not become pregnant while taking this medicine or for 1 year after stopping it. Women should inform their doctor if they wish to become pregnant or think they might be pregnant. Men should not father a child while taking this medicine and for 4 months after stopping it. There is a potential for serious side effects to an unborn child. Talk to your health care professional or pharmacist for more information. Do not breast-feed an infant while taking this medicine. This medicine may interfere with the ability to have a child. This medicine has caused ovarian failure in some women. This medicine has caused reduced sperm counts in some men. You should talk with your doctor or health care professional if you are concerned about your fertility. If you are going to have surgery, tell your doctor or health care professional that you have taken this medicine. What side effects may I notice from receiving this medicine? Side effects that you should report to your doctor or health care professional as soon as possible:  allergic reactions like skin rash, itching or hives, swelling of the face, lips, or tongue  low blood counts - this medicine may decrease the number of white blood cells, red blood cells and platelets.  You may be at increased risk for infections and bleeding.  signs of infection - fever or chills, cough, sore throat, pain or difficulty passing urine  signs of decreased platelets or bleeding - bruising, pinpoint red spots on the skin, black, tarry stools, blood in the urine  signs of decreased red blood cells - unusually weak or tired, fainting spells, lightheadedness  breathing problems  dark urine  dizziness  palpitations  swelling of the ankles, feet, hands  trouble passing urine or change in the amount of urine  weight gain  yellowing of the eyes or skin Side effects that usually do not require medical attention (report to your doctor or health care professional if they continue or are bothersome):  changes in nail or skin color  hair loss  missed menstrual periods  mouth sores  nausea, vomiting This list may not describe all possible side effects. Call your doctor for medical advice about side effects. You may report side effects to FDA at 1-800-FDA-1088. Where should I keep my medicine? This drug is given in a hospital or clinic and will not  be stored at home. NOTE: This sheet is a summary. It may not cover all possible information. If you have questions about this medicine, talk to your doctor, pharmacist, or health care provider.  2020 Elsevier/Gold Standard (2011-12-25 16:22:58) Carfilzomib injection What is this medicine? CARFILZOMIB (kar FILZ oh mib) targets a specific protein within cancer cells and stops the cancer cells from growing. It is used to treat multiple myeloma. This medicine may be used for other purposes; ask your health care provider or pharmacist if you have questions. COMMON BRAND NAME(S): KYPROLIS What should I tell my health care provider before I take this medicine? They need to know if you have any of these conditions:  heart disease  history of blood clots  irregular heartbeat  kidney disease  liver disease  lung or breathing  disease  an unusual or allergic reaction to carfilzomib, or other medicines, foods, dyes, or preservatives  pregnant or trying to get pregnant  breast-feeding How should I use this medicine? This medicine is for injection or infusion into a vein. It is given by a health care professional in a hospital or clinic setting. Talk to your pediatrician regarding the use of this medicine in children. Special care may be needed. Overdosage: If you think you have taken too much of this medicine contact a poison control center or emergency room at once. NOTE: This medicine is only for you. Do not share this medicine with others. What if I miss a dose? It is important not to miss your dose. Call your doctor or health care professional if you are unable to keep an appointment. What may interact with this medicine? Interactions are not expected. Give your health care provider a list of all the medicines, herbs, non-prescription drugs, or dietary supplements you use. Also tell them if you smoke, drink alcohol, or use illegal drugs. Some items may interact with your medicine. This list may not describe all possible interactions. Give your health care provider a list of all the medicines, herbs, non-prescription drugs, or dietary supplements you use. Also tell them if you smoke, drink alcohol, or use illegal drugs. Some items may interact with your medicine. What should I watch for while using this medicine? Your condition will be monitored carefully while you are receiving this medicine. Report any side effects. Continue your course of treatment even though you feel ill unless your doctor tells you to stop. You may need blood work done while you are taking this medicine. Do not become pregnant while taking this medicine or for at least 6 months after stopping it. Women should inform their doctor if they wish to become pregnant or think they might be pregnant. There is a potential for serious side effects to an  unborn child. Men should not father a child while taking this medicine and for at least 3 months after stopping it. Talk to your health care professional or pharmacist for more information. Do not breast-feed an infant while taking this medicine or for 2 weeks after the last dose. Check with your doctor or health care professional if you get an attack of severe diarrhea, nausea and vomiting, or if you sweat a lot. The loss of too much body fluid can make it dangerous for you to take this medicine. You may get dizzy. Do not drive, use machinery, or do anything that needs mental alertness until you know how this medicine affects you. Do not stand or sit up quickly, especially if you are an older patient. This  reduces the risk of dizzy or fainting spells. What side effects may I notice from receiving this medicine? Side effects that you should report to your doctor or health care professional as soon as possible:  allergic reactions like skin rash, itching or hives, swelling of the face, lips, or tongue  confusion  dizziness  feeling faint or lightheaded  fever or chills  palpitations  seizures  signs and symptoms of bleeding such as bloody or black, tarry stools; red or dark-brown urine; spitting up blood or brown material that looks like coffee grounds; red spots on the skin; unusual bruising or bleeding including from the eye, gums, or nose  signs and symptoms of a blood clot such as breathing problems; changes in vision; chest pain; severe, sudden headache; pain, swelling, warmth in the leg; trouble speaking; sudden numbness or weakness of the face, arm or leg  signs and symptoms of kidney injury like trouble passing urine or change in the amount of urine  signs and symptoms of liver injury like dark yellow or brown urine; general ill feeling or flu-like symptoms; light-colored stools; loss of appetite; nausea; right upper belly pain; unusually weak or tired; yellowing of the eyes or  skin Side effects that usually do not require medical attention (report to your doctor or health care professional if they continue or are bothersome):  back pain  cough  diarrhea  headache  muscle cramps  vomiting This list may not describe all possible side effects. Call your doctor for medical advice about side effects. You may report side effects to FDA at 1-800-FDA-1088. Where should I keep my medicine? This drug is given in a hospital or clinic and will not be stored at home. NOTE: This sheet is a summary. It may not cover all possible information. If you have questions about this medicine, talk to your doctor, pharmacist, or health care provider.  2020 [employer]vier/Gold Standard (2016-11-25 14:07:13)

## 2018-12-05 NOTE — Progress Notes (Signed)
Hematology and Oncology Follow Up Visit  [name] [id number] October 20, 1950 68 y.o. 12/05/2018   Principle Diagnosis:  IgG kappa myeloma  Anemia secondary to renal insufficiency  Intermittent iron - deficiency anemia  Hypotestosteronemia  Current Therapy:   Aredia 60 mg IV q 3 months-- next dose 12/2018 Aranesp 300 mcg subcu as needed for hemoglobin less than 11 DepoTestosterone400 mg q4weeks IV iron as indicated Kyprolis/Cytoxan - s/p cycle21   Interim History:  [name] is here today for follow-up and treatment. He is having some dizziness and fatigue at times. He has had some sinus congestion.  September, no M-spike detected, IgG level 1,007 mg/dL and kappa light chains 3.56 mg/dL. No fever, chills, n/v, cough, rash, dizziness, SOB, chest pain, palpitations, abdominal pain or changes in bowel or bladder habits.  Lactulose has helped resolve his constipation. He takes as needed.  His generalized aches and pains are still present. The cold change in weather has caused an increase in discomfort.   No swelling in his extremities.  He has numbness and tingling in his lower extremities that waxes and wanes.  No falls or syncopal episodes.  No episodes or bleeding, no bruising or petechiae.  His appetite comes and goes. He had an issue with getting the Marinol and has not started. We will check on this for him and see if PA has been done. He is staying well hydrated. His weight is stable.   ECOG Performance Status: 1 - Symptomatic but completely ambulatory  Medications:  Allergies as of 12/05/2018      Reactions   Iodinated Diagnostic Agents Rash   Patient states he was instructed not to take IV contrast.  In 2008 he had an unknown reaction, and was told not to take it again.  He was also told not to take it due to his kidneys.   Iodine Anxiety, Rash, Other (See Comments)   Didn't feel right "instructed not to take per MD--something with his port"      Medication  List       Accurate as of December 05, 2018  8:42 AM. If you have any questions, ask your nurse or doctor.        amLODipine 5 MG tablet Commonly known as: NORVASC Take 5 mg by mouth daily.   amLODipine 10 MG tablet Commonly known as: NORVASC Take 10 mg by mouth 2 (two) times daily.   atorvastatin 40 MG tablet Commonly known as: LIPITOR daily.   Carafate 1 GM/10ML suspension Generic drug: sucralfate Take 1 g by mouth 4 (four) times daily.   Clear Eyes All Seasons 5-6 MG/ML Soln Generic drug: Polyvinyl Alcohol-Povidone Place 2 drops into both eyes 3 (three) times daily as needed (drye eyes.).   cyclobenzaprine 10 MG tablet Commonly known as: FLEXERIL Take 10 mg by mouth 3 (three) times daily.   dicyclomine 10 MG capsule Commonly known as: BENTYL TAKE 1 CAPSULE BY MOUTH THREE TIMES A DAY   dronabinol 2.5 MG capsule Commonly known as: MARINOL Take 1 capsule (2.5 mg total) by mouth 2 (two) times daily before a meal.   Embeda 30-1.2 MG Cpcr Generic drug: Morphine-Naltrexone Take 3 (three) times daily by mouth.   Embeda 20-0.8 MG Cpcr Generic drug: Morphine-Naltrexone Take 1 capsule by mouth 3 (three) times daily.   furosemide 40 MG tablet Commonly known as: LASIX Take 40 mg by mouth daily.   gabapentin 300 MG capsule Commonly known as: NEURONTIN TAKE 1 CAPSULE BY MOUTH THREE TIMES A DAY  glipiZIDE 5 MG tablet Commonly known as: GLUCOTROL Take 5 mg by mouth 2 (two) times daily before a meal.   HYDROcodone-acetaminophen 5-325 MG tablet Commonly known as: Norco Take 1-2 tablets by mouth every 6 (six) hours as needed. What changed: reasons to take this   lactulose 10 GM/15ML solution Commonly known as: CHRONULAC Take 30 mLs (20 g total) by mouth daily.   latanoprost 0.005 % ophthalmic solution Commonly known as: XALATAN Place 1 drop into the right eye every evening.   lidocaine-prilocaine cream Commonly known as: EMLA Apply 1 application topically as  needed.   linaclotide 145 MCG Caps capsule Commonly known as: LINZESS Take 1 capsule (145 mcg total) by mouth daily before breakfast. What changed:   when to take this  reasons to take this   LORazepam 0.5 MG tablet Commonly known as: Ativan Take 1 tablet (0.5 mg total) by mouth every 6 (six) hours as needed (Nausea or vomiting).   losartan 25 MG tablet Commonly known as: COZAAR Take 25 mg by mouth.   losartan 50 MG tablet Commonly known as: COZAAR   mesalamine 1.2 g EC tablet Commonly known as: Lialda Take 1 tablet (1.2 g total) by mouth daily with breakfast.   metoprolol succinate 100 MG 24 hr tablet Commonly known as: TOPROL-XL Take 100 mg by mouth daily.   mirabegron ER 50 MG Tb24 tablet Commonly known as: MYRBETRIQ Take 50 mg by mouth daily.   morphine 30 MG 12 hr tablet Commonly known as: MS CONTIN Take 30 mg by mouth 2 (two) times daily.   omeprazole 40 MG capsule Commonly known as: PRILOSEC Take 1 capsule (40 mg total) by mouth 2 (two) times daily.   ondansetron 8 MG tablet Commonly known as: ZOFRAN TAKE 1 TABLET(8 MG) BY MOUTH TWICE DAILY AS NEEDED FOR NAUSEA OR VOMITING What changed: See the new instructions.   orphenadrine 100 MG tablet Commonly known as: NORFLEX Take 1 tablet (100 mg total) by mouth 2 (two) times daily.   oxyCODONE-acetaminophen 10-325 MG tablet Commonly known as: PERCOCET Take 1 tablet by mouth every 6 (six) hours as needed for pain.   pioglitazone 30 MG tablet Commonly known as: ACTOS Take 30 mg by mouth daily.   potassium chloride SA 20 MEQ tablet Commonly known as: KLOR-CON Take 1 tablet (20 mEq total) by mouth 2 (two) times daily.   prochlorperazine 10 MG tablet Commonly known as: COMPAZINE take 1 tablet by mouth every 6 hours if needed for nausea and vomiting What changed:   how much to take  how to take this  when to take this  reasons to take this   Relistor 150 MG Tabs Generic drug: Methylnaltrexone  Bromide Take 150 mg by mouth.   Sennosides 25 MG Tabs Take 25 mg by mouth daily as needed (constipation).   Vitamin D (Ergocalciferol) 1.25 MG (50000 UT) Caps capsule Commonly known as: DRISDOL Take 50,000 Units by mouth every Saturday.   Xtampza ER 13.5 MG C12a Generic drug: oxyCODONE ER Take 1 capsule by mouth 2 (two) times daily.   Zioptan 0.0015 % Soln Generic drug: Tafluprost (PF) Place 1 drop into both eyes daily.       Allergies:  Allergies  Allergen Reactions  . Iodinated Diagnostic Agents Rash    Patient states he was instructed not to take IV contrast.  In 2008 he had an unknown reaction, and was told not to take it again.  He was also told not to take it due  to his kidneys.  . Iodine Anxiety, Rash and Other (See Comments)    Didn't feel right "instructed not to take per MD--something with his port"     Past Medical History, Surgical history, Social history, and Family History were reviewed and updated.  Review of Systems: All other 10 point review of systems is negative.   Physical Exam:  vitals were not taken for this visit.   Wt Readings from Last 3 Encounters:  12/05/18 202 lb 8 oz (91.9 kg)  11/14/18 196 lb (88.9 kg)  11/14/18 196 lb (88.9 kg)    Ocular: Sclerae unicteric, pupils equal, round and reactive to light Ear-nose-throat: Oropharynx clear, dentition fair Lymphatic: No cervical or supraclavicular adenopathy Lungs no rales or rhonchi, good excursion bilaterally Heart regular rate and rhythm, no murmur appreciated Abd soft, nontender, positive bowel sounds, no liver or spleen tip palpated on exam, no fluid wave  MSK no focal spinal tenderness, no joint edema Neuro: non-focal, well-oriented, appropriate affect Breasts: Deferred   Lab Results  Component Value Date   WBC 3.7 (L) 12/05/2018   HGB 10.6 (L) 12/05/2018   HCT 33.4 (L) 12/05/2018   MCV 83.9 12/05/2018   PLT 137 (L) 12/05/2018   Lab Results  Component Value Date   FERRITIN  1,150 (H) 11/14/2018   IRON 74 11/14/2018   TIBC 258 11/14/2018   UIBC 184 11/14/2018   IRONPCTSAT 29 11/14/2018   Lab Results  Component Value Date   RETICCTPCT 0.6 07/25/2018   RBC 3.98 (L) 12/05/2018   RETICCTABS 27.5 03/21/2014   Lab Results  Component Value Date   KPAFRELGTCHN 35.6 (H) 11/14/2018   LAMBDASER 19.7 11/14/2018   KAPLAMBRATIO 1.81 (H) 11/14/2018   Lab Results  Component Value Date   IGGSERUM 1,007 11/14/2018   IGA 166 11/14/2018   IGMSERUM 29 11/14/2018   Lab Results  Component Value Date   TOTALPROTELP 6.7 11/14/2018   ALBUMINELP 3.9 11/14/2018   A1GS 0.2 11/14/2018   A2GS 0.9 11/14/2018   BETS 0.9 11/14/2018   BETA2SER 0.5 11/07/2014   GAMS 0.7 11/14/2018   MSPIKE Not Observed 11/14/2018   SPEI Comment 10/24/2018     Chemistry      Component Value Date/Time   NA 141 11/14/2018 0823   NA 143 02/22/2017 0744   K 3.7 11/14/2018 0823   K 4.0 02/22/2017 0744   CL 103 11/14/2018 0823   CL 106 02/22/2017 0744   CO2 29 11/14/2018 0823   CO2 27 02/22/2017 0744   BUN 40 (H) 11/14/2018 0823   BUN 26 (H) 02/22/2017 0744   CREATININE 2.64 (H) 11/14/2018 0823   CREATININE 2.3 (H) 02/22/2017 0744      Component Value Date/Time   CALCIUM 9.3 11/14/2018 0823   CALCIUM 9.3 02/22/2017 0744   ALKPHOS 73 11/14/2018 0823   ALKPHOS 80 02/22/2017 0744   AST 18 11/14/2018 0823   ALT 19 11/14/2018 0823   ALT 22 02/22/2017 0744   BILITOT 0.7 11/14/2018 0823       Impression and Plan: Mr. Swayze is a very pleasant 68 yo African American gentleman with IgG kappa myeloma.  We will proceed with treatment today as planned.  Per Baxter Flattery, Marinol approved and ready to be picked up. Patient aware.  We will plan to see him back in another 3 weeks.  He will contact our office with any questions or concerns. We can certainly see him sooner if needed.   Laverna Peace, NP 10/12/20208:42 AM

## 2018-12-05 NOTE — Patient Instructions (Signed)

## 2018-12-05 NOTE — Progress Notes (Signed)
Ok to treat with elevated creatinine per Dr Ennever. dph 

## 2018-12-06 LAB — TESTOSTERONE: Testosterone: 713 ng/dL (ref 264–916)

## 2018-12-09 ENCOUNTER — Other Ambulatory Visit: Payer: Self-pay | Admitting: *Deleted

## 2018-12-09 DIAGNOSIS — C9000 Multiple myeloma not having achieved remission: Secondary | ICD-10-CM

## 2018-12-09 MED ORDER — DRONABINOL 2.5 MG PO CAPS: 2.5000 mg | ORAL_CAPSULE | Freq: Two times a day (BID) | ORAL | 0 refills | Status: DC

## 2018-12-11 ENCOUNTER — Other Ambulatory Visit: Payer: Self-pay | Admitting: Family

## 2018-12-11 DIAGNOSIS — K5909 Other constipation: Secondary | ICD-10-CM

## 2018-12-11 DIAGNOSIS — C9001 Multiple myeloma in remission: Secondary | ICD-10-CM

## 2018-12-26 ENCOUNTER — Inpatient Hospital Stay: Payer: Medicare Other

## 2018-12-26 ENCOUNTER — Inpatient Hospital Stay: Payer: Medicare Other | Attending: Hematology & Oncology | Admitting: Hematology & Oncology

## 2018-12-26 ENCOUNTER — Other Ambulatory Visit: Payer: Self-pay

## 2018-12-26 VITALS — BP 156/77 | HR 81 | Temp 98.6°F | Resp 17 | Wt 200.0 lb

## 2018-12-26 DIAGNOSIS — D631 Anemia in chronic kidney disease: Secondary | ICD-10-CM

## 2018-12-26 DIAGNOSIS — R202 Paresthesia of skin: Secondary | ICD-10-CM | POA: Diagnosis not present

## 2018-12-26 DIAGNOSIS — D509 Iron deficiency anemia, unspecified: Secondary | ICD-10-CM | POA: Diagnosis not present

## 2018-12-26 DIAGNOSIS — Z299 Encounter for prophylactic measures, unspecified: Secondary | ICD-10-CM

## 2018-12-26 DIAGNOSIS — R200 Anesthesia of skin: Secondary | ICD-10-CM | POA: Insufficient documentation

## 2018-12-26 DIAGNOSIS — D508 Other iron deficiency anemias: Secondary | ICD-10-CM

## 2018-12-26 DIAGNOSIS — C9000 Multiple myeloma not having achieved remission: Secondary | ICD-10-CM

## 2018-12-26 DIAGNOSIS — E119 Type 2 diabetes mellitus without complications: Secondary | ICD-10-CM | POA: Insufficient documentation

## 2018-12-26 DIAGNOSIS — Z5111 Encounter for antineoplastic chemotherapy: Secondary | ICD-10-CM | POA: Diagnosis present

## 2018-12-26 DIAGNOSIS — N189 Chronic kidney disease, unspecified: Secondary | ICD-10-CM

## 2018-12-26 DIAGNOSIS — E349 Endocrine disorder, unspecified: Secondary | ICD-10-CM

## 2018-12-26 DIAGNOSIS — D500 Iron deficiency anemia secondary to blood loss (chronic): Secondary | ICD-10-CM | POA: Diagnosis not present

## 2018-12-26 DIAGNOSIS — Z5112 Encounter for antineoplastic immunotherapy: Secondary | ICD-10-CM | POA: Diagnosis present

## 2018-12-26 LAB — CBC WITH DIFFERENTIAL (CANCER CENTER ONLY)
Abs Immature Granulocytes: 0 10*3/uL (ref 0.00–0.07)
Basophils Absolute: 0 10*3/uL (ref 0.0–0.1)
Basophils Relative: 1 %
Eosinophils Absolute: 0.1 10*3/uL (ref 0.0–0.5)
Eosinophils Relative: 3 %
HCT: 39.2 % (ref 39.0–52.0)
Hemoglobin: 12.2 g/dL — ABNORMAL LOW (ref 13.0–17.0)
Immature Granulocytes: 0 %
Lymphocytes Relative: 22 %
Lymphs Abs: 0.7 10*3/uL (ref 0.7–4.0)
MCH: 25.6 pg — ABNORMAL LOW (ref 26.0–34.0)
MCHC: 31.1 g/dL (ref 30.0–36.0)
MCV: 82.4 fL (ref 80.0–100.0)
Monocytes Absolute: 0.4 10*3/uL (ref 0.1–1.0)
Monocytes Relative: 12 %
Neutro Abs: 1.9 10*3/uL (ref 1.7–7.7)
Neutrophils Relative %: 62 %
Platelet Count: 157 10*3/uL (ref 150–400)
RBC: 4.76 MIL/uL (ref 4.22–5.81)
RDW: 17 % — ABNORMAL HIGH (ref 11.5–15.5)
WBC Count: 3.1 10*3/uL — ABNORMAL LOW (ref 4.0–10.5)
nRBC: 0 % (ref 0.0–0.2)

## 2018-12-26 LAB — CMP (CANCER CENTER ONLY)
ALT: 21 U/L (ref 0–44)
AST: 25 U/L (ref 15–41)
Albumin: 4.5 g/dL (ref 3.5–5.0)
Alkaline Phosphatase: 73 U/L (ref 38–126)
Anion gap: 8 (ref 5–15)
BUN: 18 mg/dL (ref 8–23)
CO2: 29 mmol/L (ref 22–32)
Calcium: 9.5 mg/dL (ref 8.9–10.3)
Chloride: 103 mmol/L (ref 98–111)
Creatinine: 2.14 mg/dL — ABNORMAL HIGH (ref 0.61–1.24)
GFR, Est AFR Am: 35 mL/min — ABNORMAL LOW (ref >60)
GFR, Estimated: 30 mL/min — ABNORMAL LOW (ref >60)
Glucose, Bld: 113 mg/dL — ABNORMAL HIGH (ref 70–99)
Potassium: 3.5 mmol/L (ref 3.5–5.1)
Sodium: 140 mmol/L (ref 135–145)
Total Bilirubin: 0.5 mg/dL (ref 0.3–1.2)
Total Protein: 7.2 g/dL (ref 6.5–8.1)

## 2018-12-26 LAB — IRON AND TIBC
Iron: 85 ug/dL (ref 42–163)
Saturation Ratios: 31 % (ref 20–55)
TIBC: 273 ug/dL (ref 202–409)
UIBC: 187 ug/dL (ref 117–376)

## 2018-12-26 LAB — FERRITIN: Ferritin: 1583 ng/mL — ABNORMAL HIGH (ref 24–336)

## 2018-12-26 MED ORDER — TESTOSTERONE CYPIONATE 200 MG/ML IM SOLN
400.0000 mg | Freq: Once | INTRAMUSCULAR | Status: AC
  Administered 2018-12-26: 400 mg via INTRAMUSCULAR

## 2018-12-26 MED ORDER — PALONOSETRON HCL INJECTION 0.25 MG/5ML
INTRAVENOUS | Status: AC
  Filled 2018-12-26: qty 5

## 2018-12-26 MED ORDER — DEXTROSE 5 % IV SOLN
28.6000 mg/m2 | Freq: Once | INTRAVENOUS | Status: AC
  Administered 2018-12-26: 60 mg via INTRAVENOUS
  Filled 2018-12-26: qty 30

## 2018-12-26 MED ORDER — PALONOSETRON HCL INJECTION 0.25 MG/5ML
0.2500 mg | Freq: Once | INTRAVENOUS | Status: AC
  Administered 2018-12-26: 0.25 mg via INTRAVENOUS

## 2018-12-26 MED ORDER — HYDROMORPHONE HCL 1 MG/ML IJ SOLN
2.0000 mg | Freq: Once | INTRAMUSCULAR | Status: AC
  Administered 2018-12-26: 2 mg via INTRAVENOUS

## 2018-12-26 MED ORDER — SODIUM CHLORIDE 0.9 % IV SOLN
Freq: Once | INTRAVENOUS | Status: AC
  Administered 2018-12-26: 09:00:00 via INTRAVENOUS
  Filled 2018-12-26: qty 250

## 2018-12-26 MED ORDER — SODIUM CHLORIDE 0.9 % IV SOLN
Freq: Once | INTRAVENOUS | Status: DC
  Filled 2018-12-26: qty 250

## 2018-12-26 MED ORDER — SODIUM CHLORIDE 0.9 % IV SOLN
300.0000 mg/m2 | Freq: Once | INTRAVENOUS | Status: AC
  Administered 2018-12-26: 11:00:00 620 mg via INTRAVENOUS
  Filled 2018-12-26: qty 31

## 2018-12-26 MED ORDER — TESTOSTERONE CYPIONATE 200 MG/ML IM SOLN
INTRAMUSCULAR | Status: AC
  Filled 2018-12-26: qty 2

## 2018-12-26 MED ORDER — DEXAMETHASONE SODIUM PHOSPHATE 10 MG/ML IJ SOLN
10.0000 mg | Freq: Once | INTRAMUSCULAR | Status: AC
  Administered 2018-12-26: 10:00:00 10 mg via INTRAVENOUS

## 2018-12-26 MED ORDER — HYDROMORPHONE HCL 1 MG/ML IJ SOLN
INTRAMUSCULAR | Status: AC
  Filled 2018-12-26: qty 2

## 2018-12-26 MED ORDER — SODIUM CHLORIDE 0.9% FLUSH
10.0000 mL | INTRAVENOUS | Status: DC | PRN
  Administered 2018-12-26: 10 mL
  Filled 2018-12-26: qty 10

## 2018-12-26 MED ORDER — DEXAMETHASONE SODIUM PHOSPHATE 10 MG/ML IJ SOLN
INTRAMUSCULAR | Status: AC
  Filled 2018-12-26: qty 1

## 2018-12-26 MED ORDER — HEPARIN SOD (PORK) LOCK FLUSH 100 UNIT/ML IV SOLN
500.0000 [IU] | Freq: Once | INTRAVENOUS | Status: AC | PRN
  Administered 2018-12-26: 500 [IU]
  Filled 2018-12-26: qty 5

## 2018-12-26 NOTE — Patient Instructions (Signed)
Testosterone injection What is this medicine? TESTOSTERONE (tes TOS ter one) is the main male hormone. It supports normal male development such as muscle growth, facial hair, and deep voice. It is used in males to treat low testosterone levels. This medicine may be used for other purposes; ask your health care provider or pharmacist if you have questions. COMMON BRAND NAME(S): Andro-L.A., Aveed, Delatestryl, Depo-Testosterone, Virilon What should I tell my health care provider before I take this medicine? They need to know if you have any of these conditions:  cancer  diabetes  heart disease  kidney disease  liver disease  lung disease  prostate disease  an unusual or allergic reaction to testosterone, other medicines, foods, dyes, or preservatives  pregnant or trying to get pregnant  breast-feeding How should I use this medicine? This medicine is for injection into a muscle. It is usually given by a health care professional in a hospital or clinic setting. Contact your pediatrician regarding the use of this medicine in children. While this medicine may be prescribed for children as young as 54 years of age for selected conditions, precautions do apply. Overdosage: If you think you have taken too much of this medicine contact a poison control center or emergency room at once. NOTE: This medicine is only for you. Do not share this medicine with others. What if I miss a dose? Try not to miss a dose. Your doctor or health care professional will tell you when your next injection is due. Notify the office if you are unable to keep an appointment. What may interact with this medicine?  medicines for diabetes  medicines that treat or prevent blood clots like warfarin  oxyphenbutazone  propranolol  steroid medicines like prednisone or cortisone This list may not describe all possible interactions. Give your health care provider a list of all the medicines, herbs, non-prescription  drugs, or dietary supplements you use. Also tell them if you smoke, drink alcohol, or use illegal drugs. Some items may interact with your medicine. What should I watch for while using this medicine? Visit your doctor or health care professional for regular checks on your progress. They will need to check the level of testosterone in your blood. This medicine is only approved for use in men who have low levels of testosterone related to certain medical conditions. Heart attacks and strokes have been reported with the use of this medicine. Notify your doctor or health care professional and seek emergency treatment if you develop breathing problems; changes in vision; confusion; chest pain or chest tightness; sudden arm pain; severe, sudden headache; trouble speaking or understanding; sudden numbness or weakness of the face, arm or leg; loss of balance or coordination. Talk to your doctor about the risks and benefits of this medicine. This medicine may affect blood sugar levels. If you have diabetes, check with your doctor or health care professional before you change your diet or the dose of your diabetic medicine. Testosterone injections are not commonly used in women. Women should inform their doctor if they wish to become pregnant or think they might be pregnant. There is a potential for serious side effects to an unborn child. Talk to your health care professional or pharmacist for more information. Talk with your doctor or health care professional about your birth control options while taking this medicine. This drug is banned from use in athletes by most athletic organizations. What side effects may I notice from receiving this medicine? Side effects that you should report to  your doctor or health care professional as soon as possible:  allergic reactions like skin rash, itching or hives, swelling of the face, lips, or tongue  breast enlargement  breathing problems  changes in emotions or  moods  deep or hoarse voice  irregular menstrual periods  signs and symptoms of liver injury like dark yellow or brown urine; general ill feeling or flu-like symptoms; light-colored stools; loss of appetite; nausea; right upper belly pain; unusually weak or tired; yellowing of the eyes or skin  stomach pain  swelling of the ankles, feet, hands  too frequent or persistent erections  trouble passing urine or change in the amount of urine Side effects that usually do not require medical attention (report to your doctor or health care professional if they continue or are bothersome):  acne  change in sex drive or performance  facial hair growth  hair loss  headache This list may not describe all possible side effects. Call your doctor for medical advice about side effects. You may report side effects to FDA at 1-800-FDA-1088. Where should I keep my medicine? Keep out of the reach of children. This medicine can be abused. Keep your medicine in a safe place to protect it from theft. Do not share this medicine with anyone. Selling or giving away this medicine is dangerous and against the law. Store at room temperature between 20 and 25 degrees C (68 and 77 degrees F). Do not freeze. Protect from light. Follow the directions for the product you are prescribed. Throw away any unused medicine after the expiration date. NOTE: This sheet is a summary. It may not cover all possible information. If you have questions about this medicine, talk to your doctor, pharmacist, or health care provider.  2020 Elsevier/Gold Standard (2015-03-16 07:33:55) Cyclophosphamide injection What is this medicine? CYCLOPHOSPHAMIDE (sye kloe FOSS fa mide) is a chemotherapy drug. It slows the growth of cancer cells. This medicine is used to treat many types of cancer like lymphoma, myeloma, leukemia, breast cancer, and ovarian cancer, to name a few. This medicine may be used for other purposes; ask your health care  provider or pharmacist if you have questions. COMMON BRAND NAME(S): Cytoxan, Neosar What should I tell my health care provider before I take this medicine? They need to know if you have any of these conditions:  blood disorders  history of other chemotherapy  infection  kidney disease  liver disease  recent or ongoing radiation therapy  tumors in the bone marrow  an unusual or allergic reaction to cyclophosphamide, other chemotherapy, other medicines, foods, dyes, or preservatives  pregnant or trying to get pregnant  breast-feeding How should I use this medicine? This drug is usually given as an injection into a vein or muscle or by infusion into a vein. It is administered in a hospital or clinic by a specially trained health care professional. Talk to your pediatrician regarding the use of this medicine in children. Special care may be needed. Overdosage: If you think you have taken too much of this medicine contact a poison control center or emergency room at once. NOTE: This medicine is only for you. Do not share this medicine with others. What if I miss a dose? It is important not to miss your dose. Call your doctor or health care professional if you are unable to keep an appointment. What may interact with this medicine? This medicine may interact with the following medications:  amiodarone  amphotericin B  azathioprine  certain antiviral medicines   for HIV or AIDS such as protease inhibitors (e.g., indinavir, ritonavir) and zidovudine  certain blood pressure medications such as benazepril, captopril, enalapril, fosinopril, lisinopril, moexipril, monopril, perindopril, quinapril, ramipril, trandolapril  certain cancer medications such as anthracyclines (e.g., daunorubicin, doxorubicin), busulfan, cytarabine, paclitaxel, pentostatin, tamoxifen, trastuzumab  certain diuretics such as chlorothiazide, chlorthalidone, hydrochlorothiazide, indapamide, metolazone  certain  medicines that treat or prevent blood clots like warfarin  certain muscle relaxants such as succinylcholine  cyclosporine  etanercept  indomethacin  medicines to increase blood counts like filgrastim, pegfilgrastim, sargramostim  medicines used as general anesthesia  metronidazole  natalizumab This list may not describe all possible interactions. Give your health care provider a list of all the medicines, herbs, non-prescription drugs, or dietary supplements you use. Also tell them if you smoke, drink alcohol, or use illegal drugs. Some items may interact with your medicine. What should I watch for while using this medicine? Visit your doctor for checks on your progress. This drug may make you feel generally unwell. This is not uncommon, as chemotherapy can affect healthy cells as well as cancer cells. Report any side effects. Continue your course of treatment even though you feel ill unless your doctor tells you to stop. Drink water or other fluids as directed. Urinate often, even at night. In some cases, you may be given additional medicines to help with side effects. Follow all directions for their use. Call your doctor or health care professional for advice if you get a fever, chills or sore throat, or other symptoms of a cold or flu. Do not treat yourself. This drug decreases your body's ability to fight infections. Try to avoid being around people who are sick. This medicine may increase your risk to bruise or bleed. Call your doctor or health care professional if you notice any unusual bleeding. Be careful brushing and flossing your teeth or using a toothpick because you may get an infection or bleed more easily. If you have any dental work done, tell your dentist you are receiving this medicine. You may get drowsy or dizzy. Do not drive, use machinery, or do anything that needs mental alertness until you know how this medicine affects you. Do not become pregnant while taking this  medicine or for 1 year after stopping it. Women should inform their doctor if they wish to become pregnant or think they might be pregnant. Men should not father a child while taking this medicine and for 4 months after stopping it. There is a potential for serious side effects to an unborn child. Talk to your health care professional or pharmacist for more information. Do not breast-feed an infant while taking this medicine. This medicine may interfere with the ability to have a child. This medicine has caused ovarian failure in some women. This medicine has caused reduced sperm counts in some men. You should talk with your doctor or health care professional if you are concerned about your fertility. If you are going to have surgery, tell your doctor or health care professional that you have taken this medicine. What side effects may I notice from receiving this medicine? Side effects that you should report to your doctor or health care professional as soon as possible:  allergic reactions like skin rash, itching or hives, swelling of the face, lips, or tongue  low blood counts - this medicine may decrease the number of white blood cells, red blood cells and platelets. You may be at increased risk for infections and bleeding.  signs of infection -  fever or chills, cough, sore throat, pain or difficulty passing urine  signs of decreased platelets or bleeding - bruising, pinpoint red spots on the skin, black, tarry stools, blood in the urine  signs of decreased red blood cells - unusually weak or tired, fainting spells, lightheadedness  breathing problems  dark urine  dizziness  palpitations  swelling of the ankles, feet, hands  trouble passing urine or change in the amount of urine  weight gain  yellowing of the eyes or skin Side effects that usually do not require medical attention (report to your doctor or health care professional if they continue or are bothersome):  changes in  nail or skin color  hair loss  missed menstrual periods  mouth sores  nausea, vomiting This list may not describe all possible side effects. Call your doctor for medical advice about side effects. You may report side effects to FDA at 1-800-FDA-1088. Where should I keep my medicine? This drug is given in a hospital or clinic and will not be stored at home. NOTE: This sheet is a summary. It may not cover all possible information. If you have questions about this medicine, talk to your doctor, pharmacist, or health care provider.  2020 Elsevier/Gold Standard (2011-12-25 16:22:58) Carfilzomib injection What is this medicine? CARFILZOMIB (kar FILZ oh mib) targets a specific protein within cancer cells and stops the cancer cells from growing. It is used to treat multiple myeloma. This medicine may be used for other purposes; ask your health care provider or pharmacist if you have questions. COMMON BRAND NAME(S): KYPROLIS What should I tell my health care provider before I take this medicine? They need to know if you have any of these conditions:  heart disease  history of blood clots  irregular heartbeat  kidney disease  liver disease  lung or breathing disease  an unusual or allergic reaction to carfilzomib, or other medicines, foods, dyes, or preservatives  pregnant or trying to get pregnant  breast-feeding How should I use this medicine? This medicine is for injection or infusion into a vein. It is given by a health care professional in a hospital or clinic setting. Talk to your pediatrician regarding the use of this medicine in children. Special care may be needed. Overdosage: If you think you have taken too much of this medicine contact a poison control center or emergency room at once. NOTE: This medicine is only for you. Do not share this medicine with others. What if I miss a dose? It is important not to miss your dose. Call your doctor or health care professional if  you are unable to keep an appointment. What may interact with this medicine? Interactions are not expected. Give your health care provider a list of all the medicines, herbs, non-prescription drugs, or dietary supplements you use. Also tell them if you smoke, drink alcohol, or use illegal drugs. Some items may interact with your medicine. This list may not describe all possible interactions. Give your health care provider a list of all the medicines, herbs, non-prescription drugs, or dietary supplements you use. Also tell them if you smoke, drink alcohol, or use illegal drugs. Some items may interact with your medicine. What should I watch for while using this medicine? Your condition will be monitored carefully while you are receiving this medicine. Report any side effects. Continue your course of treatment even though you feel ill unless your doctor tells you to stop. You may need blood work done while you are taking this medicine.   Do not become pregnant while taking this medicine or for at least 6 months after stopping it. Women should inform their doctor if they wish to become pregnant or think they might be pregnant. There is a potential for serious side effects to an unborn child. Men should not father a child while taking this medicine and for at least 3 months after stopping it. Talk to your health care professional or pharmacist for more information. Do not breast-feed an infant while taking this medicine or for 2 weeks after the last dose. Check with your doctor or health care professional if you get an attack of severe diarrhea, nausea and vomiting, or if you sweat a lot. The loss of too much body fluid can make it dangerous for you to take this medicine. You may get dizzy. Do not drive, use machinery, or do anything that needs mental alertness until you know how this medicine affects you. Do not stand or sit up quickly, especially if you are an older patient. This reduces the risk of dizzy or  fainting spells. What side effects may I notice from receiving this medicine? Side effects that you should report to your doctor or health care professional as soon as possible:  allergic reactions like skin rash, itching or hives, swelling of the face, lips, or tongue  confusion  dizziness  feeling faint or lightheaded  fever or chills  palpitations  seizures  signs and symptoms of bleeding such as bloody or black, tarry stools; red or dark-brown urine; spitting up blood or brown material that looks like coffee grounds; red spots on the skin; unusual bruising or bleeding including from the eye, gums, or nose  signs and symptoms of a blood clot such as breathing problems; changes in vision; chest pain; severe, sudden headache; pain, swelling, warmth in the leg; trouble speaking; sudden numbness or weakness of the face, arm or leg  signs and symptoms of kidney injury like trouble passing urine or change in the amount of urine  signs and symptoms of liver injury like dark yellow or brown urine; general ill feeling or flu-like symptoms; light-colored stools; loss of appetite; nausea; right upper belly pain; unusually weak or tired; yellowing of the eyes or skin Side effects that usually do not require medical attention (report to your doctor or health care professional if they continue or are bothersome):  back pain  cough  diarrhea  headache  muscle cramps  vomiting This list may not describe all possible side effects. Call your doctor for medical advice about side effects. You may report side effects to FDA at 1-800-FDA-1088. Where should I keep my medicine? This drug is given in a hospital or clinic and will not be stored at home. NOTE: This sheet is a summary. It may not cover all possible information. If you have questions about this medicine, talk to your doctor, pharmacist, or health care provider.  2020 Elsevier/Gold Standard (2016-11-25 14:07:13)  

## 2018-12-26 NOTE — Patient Instructions (Signed)

## 2018-12-26 NOTE — Progress Notes (Signed)
OK to treat with creat-2.14 per order of Dr. [name].

## 2018-12-26 NOTE — Progress Notes (Signed)
Hematology and Oncology Follow Up Visit  [name] [id number] 1951/02/02 68 y.o. 12/26/2018   Principle Diagnosis:  IgG kappa myeloma  Anemia secondary to renal insufficiency  Intermittent iron - deficiency anemia  Hypotestosteronemia  Current Therapy:   Aredia 60 mg IV q 3 months-- next dose 03/2008 Aranesp 300 mcg subcu as needed for hemoglobin less than 11 DepoTestosterone400 mg q4weeks IV iron as indicated Kyprolis/Cytoxan - s/p cycle#21   Interim History:  [name] is here today for follow-up and treatment. He is having some dizziness and fatigue at times. He has had some sinus congestion.   Last myeloma studies retreated him back in September do not see a monoclonal spike in his blood.  His IgG level was 1007 mg/dL.  His kappa light chains 3.56 mg/dL.  His iron studies that we did on him today showed a ferritin of 1583 with an iron saturation of only 31%.  His last testosterone level was 713 just 3 weeks ago.  No fever, chills, n/v, cough, rash, dizziness, SOB, chest pain, palpitations, abdominal pain or changes in bowel or bladder habits.   Lactulose has helped resolve his constipation. He takes as needed.   His generalized aches and pains are still present. The cold change in weather has caused an increase in discomfort.  I know he has fibromyalgia.  I am sure this is probably an issue.  No swelling in his extremities.   He has numbness and tingling in his lower extremities that waxes and wanes.   No falls or syncopal episodes.   Overall, his performance status is ECOG 1. Medications:  Allergies as of 12/26/2018      Reactions   Iodinated Diagnostic Agents Rash   Patient states he was instructed not to take IV contrast.  In 2008 he had an unknown reaction, and was told not to take it again.  He was also told not to take it due to his kidneys.   Iodine Anxiety, Rash, Other (See Comments)   Didn't feel right "instructed not to take per MD--something  with his port"      Medication List       Accurate as of December 26, 2018  9:01 AM. If you have any questions, ask your nurse or doctor.        amLODipine 5 MG tablet Commonly known as: NORVASC Take 5 mg by mouth daily.   amLODipine 10 MG tablet Commonly known as: NORVASC Take 10 mg by mouth 2 (two) times daily.   atorvastatin 40 MG tablet Commonly known as: LIPITOR daily.   Carafate 1 GM/10ML suspension Generic drug: sucralfate Take 1 g by mouth 4 (four) times daily.   Clear Eyes All Seasons 5-6 MG/ML Soln Generic drug: Polyvinyl Alcohol-Povidone Place 2 drops into both eyes 3 (three) times daily as needed (drye eyes.).   cyclobenzaprine 10 MG tablet Commonly known as: FLEXERIL Take 10 mg by mouth 3 (three) times daily.   dicyclomine 10 MG capsule Commonly known as: BENTYL TAKE 1 CAPSULE BY MOUTH THREE TIMES A DAY   dronabinol 2.5 MG capsule Commonly known as: MARINOL Take 1 capsule (2.5 mg total) by mouth 2 (two) times daily before a meal.   Embeda 30-1.2 MG Cpcr Generic drug: Morphine-Naltrexone Take 3 (three) times daily by mouth.   Embeda 20-0.8 MG Cpcr Generic drug: Morphine-Naltrexone Take 1 capsule by mouth 3 (three) times daily.   furosemide 40 MG tablet Commonly known as: LASIX Take 40 mg by mouth daily.   gabapentin  300 MG capsule Commonly known as: NEURONTIN TAKE 1 CAPSULE BY MOUTH THREE TIMES A DAY   glipiZIDE 5 MG tablet Commonly known as: GLUCOTROL Take 5 mg by mouth 2 (two) times daily before a meal.   HYDROcodone-acetaminophen 5-325 MG tablet Commonly known as: Norco Take 1-2 tablets by mouth every 6 (six) hours as needed. What changed: reasons to take this   lactulose 10 GM/15ML solution Commonly known as: CHRONULAC TAKE 30 MLS (20 G TOTAL) BY MOUTH DAILY.   latanoprost 0.005 % ophthalmic solution Commonly known as: XALATAN Place 1 drop into the right eye every evening.   lidocaine-prilocaine cream Commonly known as: EMLA  Apply 1 application topically as needed.   linaclotide 145 MCG Caps capsule Commonly known as: LINZESS Take 1 capsule (145 mcg total) by mouth daily before breakfast. What changed:   when to take this  reasons to take this   LORazepam 0.5 MG tablet Commonly known as: Ativan Take 1 tablet (0.5 mg total) by mouth every 6 (six) hours as needed (Nausea or vomiting).   losartan 25 MG tablet Commonly known as: COZAAR Take 25 mg by mouth.   losartan 50 MG tablet Commonly known as: COZAAR   mesalamine 1.2 g EC tablet Commonly known as: Lialda Take 1 tablet (1.2 g total) by mouth daily with breakfast.   metoprolol succinate 100 MG 24 hr tablet Commonly known as: TOPROL-XL Take 100 mg by mouth daily.   mirabegron ER 50 MG Tb24 tablet Commonly known as: MYRBETRIQ Take 50 mg by mouth daily.   morphine 30 MG 12 hr tablet Commonly known as: MS CONTIN Take 30 mg by mouth 2 (two) times daily.   omeprazole 40 MG capsule Commonly known as: PRILOSEC Take 1 capsule (40 mg total) by mouth 2 (two) times daily.   ondansetron 8 MG tablet Commonly known as: ZOFRAN TAKE 1 TABLET(8 MG) BY MOUTH TWICE DAILY AS NEEDED FOR NAUSEA OR VOMITING What changed: See the new instructions.   orphenadrine 100 MG tablet Commonly known as: NORFLEX Take 1 tablet (100 mg total) by mouth 2 (two) times daily.   oxyCODONE-acetaminophen 10-325 MG tablet Commonly known as: PERCOCET Take 1 tablet by mouth every 6 (six) hours as needed for pain.   pioglitazone 30 MG tablet Commonly known as: ACTOS Take 30 mg by mouth daily.   potassium chloride SA 20 MEQ tablet Commonly known as: KLOR-CON Take 1 tablet (20 mEq total) by mouth 2 (two) times daily.   prochlorperazine 10 MG tablet Commonly known as: COMPAZINE take 1 tablet by mouth every 6 hours if needed for nausea and vomiting What changed:   how much to take  how to take this  when to take this  reasons to take this   Relistor 150 MG Tabs  Generic drug: Methylnaltrexone Bromide Take 150 mg by mouth.   Sennosides 25 MG Tabs Take 25 mg by mouth daily as needed (constipation).   Vitamin D (Ergocalciferol) 1.25 MG (50000 UT) Caps capsule Commonly known as: DRISDOL Take 50,000 Units by mouth every Saturday.   Xtampza ER 13.5 MG C12a Generic drug: oxyCODONE ER Take 1 capsule by mouth 2 (two) times daily.   Zioptan 0.0015 % Soln Generic drug: Tafluprost (PF) Place 1 drop into both eyes daily.       Allergies:  Allergies  Allergen Reactions  . Iodinated Diagnostic Agents Rash    Patient states he was instructed not to take IV contrast.  In 2008 he had an unknown reaction,  and was told not to take it again.  He was also told not to take it due to his kidneys.  . Iodine Anxiety, Rash and Other (See Comments)    Didn't feel right "instructed not to take per MD--something with his port"     Past Medical History, Surgical history, Social history, and Family History were reviewed and updated.  Review of Systems: Review of Systems  Constitutional: Positive for malaise/fatigue.  HENT: Negative.   Eyes: Negative.   Respiratory: Negative.   Cardiovascular: Negative.   Gastrointestinal: Positive for constipation.  Genitourinary: Negative.   Musculoskeletal: Positive for joint pain and myalgias.  Skin: Negative.   Endo/Heme/Allergies: Negative.   Psychiatric/Behavioral: Negative.       Physical Exam:  weight is 200 lb (90.7 kg). His temporal temperature is 98.6 F (37 C). His blood pressure is 156/77 (abnormal) and his pulse is 81. His respiration is 17 and oxygen saturation is 100%.   Wt Readings from Last 3 Encounters:  12/26/18 200 lb (90.7 kg)  12/05/18 202 lb 8 oz (91.9 kg)  11/14/18 196 lb (88.9 kg)    Physical Exam Vitals signs reviewed.  HENT:     Head: Normocephalic and atraumatic.  Eyes:     Pupils: Pupils are equal, round, and reactive to light.  Neck:     Musculoskeletal: Normal range of  motion.  Cardiovascular:     Rate and Rhythm: Normal rate and regular rhythm.     Heart sounds: Normal heart sounds.  Pulmonary:     Effort: Pulmonary effort is normal.     Breath sounds: Normal breath sounds.  Abdominal:     General: Bowel sounds are normal.     Palpations: Abdomen is soft.  Musculoskeletal: Normal range of motion.        General: No tenderness or deformity.  Lymphadenopathy:     Cervical: No cervical adenopathy.  Skin:    General: Skin is warm and dry.     Findings: No erythema or rash.  Neurological:     Mental Status: He is alert and oriented to person, place, and time.  Psychiatric:        Behavior: Behavior normal.        Thought Content: Thought content normal.        Judgment: Judgment normal.      Lab Results  Component Value Date   WBC 3.1 (L) 12/26/2018   HGB 12.2 (L) 12/26/2018   HCT 39.2 12/26/2018   MCV 82.4 12/26/2018   PLT 157 12/26/2018   Lab Results  Component Value Date   FERRITIN 1,458 (H) 12/05/2018   IRON 86 12/05/2018   TIBC 235 12/05/2018   UIBC 149 12/05/2018   IRONPCTSAT 37 12/05/2018   Lab Results  Component Value Date   RETICCTPCT 0.6 07/25/2018   RBC 4.76 12/26/2018   RETICCTABS 27.5 03/21/2014   Lab Results  Component Value Date   KPAFRELGTCHN 35.6 (H) 11/14/2018   LAMBDASER 19.7 11/14/2018   KAPLAMBRATIO 1.81 (H) 11/14/2018   Lab Results  Component Value Date   IGGSERUM 1,007 11/14/2018   IGA 166 11/14/2018   IGMSERUM 29 11/14/2018   Lab Results  Component Value Date   TOTALPROTELP 6.7 11/14/2018   ALBUMINELP 3.9 11/14/2018   A1GS 0.2 11/14/2018   A2GS 0.9 11/14/2018   BETS 0.9 11/14/2018   BETA2SER 0.5 11/07/2014   GAMS 0.7 11/14/2018   MSPIKE Not Observed 11/14/2018   SPEI Comment 10/24/2018     Chemistry  Component Value Date/Time   NA 142 12/05/2018 0810   NA 143 02/22/2017 0744   K 3.2 (L) 12/05/2018 0810   K 4.0 02/22/2017 0744   CL 103 12/05/2018 0810   CL 106 02/22/2017 0744    CO2 30 12/05/2018 0810   CO2 27 02/22/2017 0744   BUN 19 12/05/2018 0810   BUN 26 (H) 02/22/2017 0744   CREATININE 2.41 (H) 12/05/2018 0810   CREATININE 2.3 (H) 02/22/2017 0744      Component Value Date/Time   CALCIUM 8.9 12/05/2018 0810   CALCIUM 9.3 02/22/2017 0744   ALKPHOS 64 12/05/2018 0810   ALKPHOS 80 02/22/2017 0744   AST 19 12/05/2018 0810   ALT 15 12/05/2018 0810   ALT 22 02/22/2017 0744   BILITOT 0.6 12/05/2018 0810       Impression and Plan: Mr. Vantrease is a very pleasant 68 yo African American gentleman with IgG kappa myeloma.  So far, the myeloma really has not been a problem for him.  His levels have been kept quite low.  We are only treating him really once every month.  He will get the Aredia today.  This does seem to help with his arthralgias.  He gets his testosterone injection monthly also.  He does not need Aranesp today.  I know that he has a lot of health issues.  His diabetes is a problem but I think he is doing pretty well with this.  He does have pain issues and is being managed by pain management.  We will plan to get him back to see [place] in another 3 weeks.   Volanda Napoleon, MD 11/2/20209:01 AM

## 2018-12-27 LAB — IGG, IGA, IGM
IgA: 185 mg/dL (ref 61–437)
IgG (Immunoglobin G), Serum: 1045 mg/dL (ref 603–1613)
IgM (Immunoglobulin M), Srm: 34 mg/dL (ref 20–172)

## 2018-12-27 LAB — PROTEIN ELECTROPHORESIS, SERUM
A/G Ratio: 1.2 (ref 0.7–1.7)
Albumin ELP: 4.1 g/dL (ref 2.9–4.4)
Alpha-1-Globulin: 0.2 g/dL (ref 0.0–0.4)
Alpha-2-Globulin: 1 g/dL (ref 0.4–1.0)
Beta Globulin: 1 g/dL (ref 0.7–1.3)
Gamma Globulin: 1 g/dL (ref 0.4–1.8)
Globulin, Total: 3.3 g/dL (ref 2.2–3.9)
Total Protein ELP: 7.4 g/dL (ref 6.0–8.5)

## 2018-12-27 LAB — KAPPA/LAMBDA LIGHT CHAINS
Kappa free light chain: 35.6 mg/L — ABNORMAL HIGH (ref 3.3–19.4)
Kappa, lambda light chain ratio: 1.68 — ABNORMAL HIGH (ref 0.26–1.65)
Lambda free light chains: 21.2 mg/L (ref 5.7–26.3)

## 2018-12-27 LAB — TESTOSTERONE: Testosterone: 375 ng/dL (ref 264–916)

## 2019-01-04 ENCOUNTER — Other Ambulatory Visit: Payer: Self-pay | Admitting: Family

## 2019-01-04 DIAGNOSIS — C9001 Multiple myeloma in remission: Secondary | ICD-10-CM

## 2019-01-04 DIAGNOSIS — K5909 Other constipation: Secondary | ICD-10-CM

## 2019-01-16 ENCOUNTER — Inpatient Hospital Stay: Payer: Medicare Other

## 2019-01-16 ENCOUNTER — Inpatient Hospital Stay (HOSPITAL_BASED_OUTPATIENT_CLINIC_OR_DEPARTMENT_OTHER): Payer: Medicare Other | Admitting: Hematology & Oncology

## 2019-01-16 ENCOUNTER — Encounter: Payer: Self-pay | Admitting: Hematology & Oncology

## 2019-01-16 ENCOUNTER — Other Ambulatory Visit: Payer: Self-pay

## 2019-01-16 VITALS — Wt 204.0 lb

## 2019-01-16 DIAGNOSIS — N189 Chronic kidney disease, unspecified: Secondary | ICD-10-CM

## 2019-01-16 DIAGNOSIS — C9000 Multiple myeloma not having achieved remission: Secondary | ICD-10-CM

## 2019-01-16 DIAGNOSIS — Z5112 Encounter for antineoplastic immunotherapy: Secondary | ICD-10-CM | POA: Diagnosis not present

## 2019-01-16 DIAGNOSIS — E349 Endocrine disorder, unspecified: Secondary | ICD-10-CM

## 2019-01-16 DIAGNOSIS — D500 Iron deficiency anemia secondary to blood loss (chronic): Secondary | ICD-10-CM

## 2019-01-16 DIAGNOSIS — D631 Anemia in chronic kidney disease: Secondary | ICD-10-CM

## 2019-01-16 DIAGNOSIS — Z299 Encounter for prophylactic measures, unspecified: Secondary | ICD-10-CM

## 2019-01-16 LAB — CMP (CANCER CENTER ONLY)
ALT: 14 U/L (ref 0–44)
AST: 16 U/L (ref 15–41)
Albumin: 4.4 g/dL (ref 3.5–5.0)
Alkaline Phosphatase: 62 U/L (ref 38–126)
Anion gap: 9 (ref 5–15)
BUN: 29 mg/dL — ABNORMAL HIGH (ref 8–23)
CO2: 32 mmol/L (ref 22–32)
Calcium: 9.1 mg/dL (ref 8.9–10.3)
Chloride: 100 mmol/L (ref 98–111)
Creatinine: 2.45 mg/dL — ABNORMAL HIGH (ref 0.61–1.24)
GFR, Est AFR Am: 30 mL/min — ABNORMAL LOW (ref >60)
GFR, Estimated: 26 mL/min — ABNORMAL LOW (ref >60)
Glucose, Bld: 121 mg/dL — ABNORMAL HIGH (ref 70–99)
Potassium: 3.9 mmol/L (ref 3.5–5.1)
Sodium: 141 mmol/L (ref 135–145)
Total Bilirubin: 0.4 mg/dL (ref 0.3–1.2)
Total Protein: 7.5 g/dL (ref 6.5–8.1)

## 2019-01-16 LAB — CBC WITH DIFFERENTIAL (CANCER CENTER ONLY)
Abs Immature Granulocytes: 0.04 10*3/uL (ref 0.00–0.07)
Basophils Absolute: 0 10*3/uL (ref 0.0–0.1)
Basophils Relative: 1 %
Eosinophils Absolute: 0 10*3/uL (ref 0.0–0.5)
Eosinophils Relative: 1 %
HCT: 34.5 % — ABNORMAL LOW (ref 39.0–52.0)
Hemoglobin: 11.2 g/dL — ABNORMAL LOW (ref 13.0–17.0)
Immature Granulocytes: 1 %
Lymphocytes Relative: 12 %
Lymphs Abs: 0.7 10*3/uL (ref 0.7–4.0)
MCH: 25.6 pg — ABNORMAL LOW (ref 26.0–34.0)
MCHC: 32.5 g/dL (ref 30.0–36.0)
MCV: 78.9 fL — ABNORMAL LOW (ref 80.0–100.0)
Monocytes Absolute: 0.6 10*3/uL (ref 0.1–1.0)
Monocytes Relative: 10 %
Neutro Abs: 4.4 10*3/uL (ref 1.7–7.7)
Neutrophils Relative %: 75 %
Platelet Count: 175 10*3/uL (ref 150–400)
RBC: 4.37 MIL/uL (ref 4.22–5.81)
RDW: 17.5 % — ABNORMAL HIGH (ref 11.5–15.5)
WBC Count: 5.8 10*3/uL (ref 4.0–10.5)
nRBC: 0 % (ref 0.0–0.2)

## 2019-01-16 LAB — IRON AND TIBC
Iron: 96 ug/dL (ref 42–163)
Saturation Ratios: 39 % (ref 20–55)
TIBC: 246 ug/dL (ref 202–409)
UIBC: 150 ug/dL (ref 117–376)

## 2019-01-16 LAB — FERRITIN: Ferritin: 1392 ng/mL — ABNORMAL HIGH (ref 24–336)

## 2019-01-16 MED ORDER — HEPARIN SOD (PORK) LOCK FLUSH 100 UNIT/ML IV SOLN
500.0000 [IU] | Freq: Once | INTRAVENOUS | Status: AC | PRN
  Administered 2019-01-16: 500 [IU]
  Filled 2019-01-16: qty 5

## 2019-01-16 MED ORDER — HYDROMORPHONE HCL 1 MG/ML IJ SOLN
2.0000 mg | Freq: Once | INTRAMUSCULAR | Status: AC
  Administered 2019-01-16: 2 mg via INTRAVENOUS

## 2019-01-16 MED ORDER — HYDROMORPHONE HCL 1 MG/ML IJ SOLN
INTRAMUSCULAR | Status: AC
  Filled 2019-01-16: qty 2

## 2019-01-16 MED ORDER — SODIUM CHLORIDE 0.9 % IV SOLN
300.0000 mg/m2 | Freq: Once | INTRAVENOUS | Status: AC
  Administered 2019-01-16: 620 mg via INTRAVENOUS
  Filled 2019-01-16: qty 31

## 2019-01-16 MED ORDER — DEXAMETHASONE SODIUM PHOSPHATE 10 MG/ML IJ SOLN
10.0000 mg | Freq: Once | INTRAMUSCULAR | Status: AC
  Administered 2019-01-16: 10 mg via INTRAVENOUS

## 2019-01-16 MED ORDER — SODIUM CHLORIDE 0.9% FLUSH
10.0000 mL | INTRAVENOUS | Status: DC | PRN
  Administered 2019-01-16: 10 mL
  Filled 2019-01-16: qty 10

## 2019-01-16 MED ORDER — SODIUM CHLORIDE 0.9 % IV SOLN
INTRAVENOUS | Status: DC
  Administered 2019-01-16: 09:00:00 via INTRAVENOUS
  Filled 2019-01-16: qty 250

## 2019-01-16 MED ORDER — PALONOSETRON HCL INJECTION 0.25 MG/5ML
INTRAVENOUS | Status: AC
  Filled 2019-01-16: qty 5

## 2019-01-16 MED ORDER — SODIUM CHLORIDE 0.9 % IV SOLN
Freq: Once | INTRAVENOUS | Status: AC
  Filled 2019-01-16: qty 250

## 2019-01-16 MED ORDER — DEXAMETHASONE SODIUM PHOSPHATE 10 MG/ML IJ SOLN
INTRAMUSCULAR | Status: AC
  Filled 2019-01-16: qty 1

## 2019-01-16 MED ORDER — DEXTROSE 5 % IV SOLN
29.0000 mg/m2 | Freq: Once | INTRAVENOUS | Status: AC
  Administered 2019-01-16: 60 mg via INTRAVENOUS
  Filled 2019-01-16: qty 30

## 2019-01-16 MED ORDER — PALONOSETRON HCL INJECTION 0.25 MG/5ML
0.2500 mg | Freq: Once | INTRAVENOUS | Status: AC
  Administered 2019-01-16: 0.25 mg via INTRAVENOUS

## 2019-01-16 NOTE — Patient Instructions (Signed)

## 2019-01-16 NOTE — Progress Notes (Signed)
Ok to treat with elevated creatinine per Dr Ennever. dph 

## 2019-01-16 NOTE — Patient Instructions (Signed)
[facility] Discharge Instructions for Patients Receiving Chemotherapy  Today you received the following chemotherapy agents Kyprolis, Cytoxan   To help prevent nausea and vomiting after your treatment, we encourage you to take your nausea medication \\[id number] \   If you develop nausea and vomiting that is not controlled by your nausea medication, call the clinic.   BELOW ARE SYMPTOMS THAT SHOULD BE REPORTED IMMEDIATELY:  *FEVER GREATER THAN 100.5 F  *CHILLS WITH OR WITHOUT FEVER  NAUSEA AND VOMITING THAT IS NOT CONTROLLED WITH YOUR NAUSEA MEDICATION  *UNUSUAL SHORTNESS OF BREATH  *UNUSUAL BRUISING OR BLEEDING  TENDERNESS IN MOUTH AND THROAT WITH OR WITHOUT PRESENCE OF ULCERS  *URINARY PROBLEMS  *BOWEL PROBLEMS  UNUSUAL RASH Items with * indicate a potential emergency and should be followed up as soon as possible.  Feel free to call the clinic should you have any questions or concerns. The clinic phone number is [phone].  Please show the [place] at check-in to the Emergency Department and triage nurse.

## 2019-01-16 NOTE — Progress Notes (Signed)
Hematology and Oncology Follow Up Visit  [name] [id number] Jun 13, 1950 68 y.o. 01/16/2019   Principle Diagnosis:  IgG kappa myeloma  Anemia secondary to renal insufficiency  Intermittent iron - deficiency anemia  Hypotestosteronemia  Current Therapy:   Aredia 60 mg IV q 3 months-- next dose 03/2008 Aranesp 300 mcg subcu as needed for hemoglobin less than 11 DepoTestosterone400 mg q4weeks IV iron as indicated Kyprolis/Cytoxan - s/p cycle#21   Interim History:  [name] is here today for follow-up and treatment. He is having some dizziness and fatigue at times. He has had some sinus congestion.   Last myeloma studies that we tested on him back in October do not see a monoclonal spike in his blood.  His IgG level was 1045 mg/dL.  His kappa light chains 3.56 mg/dL.  His iron studies that we did on him today showed a ferritin of 1392 with an iron saturation of only 39%.  His last testosterone level was 375 just 3 weeks ago.  No fever, chills, n/v, cough, rash, dizziness, SOB, chest pain, palpitations, abdominal pain or changes in bowel or bladder habits.   Lactulose has helped resolve his constipation. He takes as needed.   His generalized aches and pains are still present. The cold change in weather has caused an increase in discomfort.  I know he has fibromyalgia.  I am sure this is probably an issue.  No swelling in his extremities.   He has numbness and tingling in his lower extremities that waxes and wanes.   No falls or syncopal episodes.   Overall, his performance status is ECOG 1. Medications:  Allergies as of 01/16/2019      Reactions   Iodinated Diagnostic Agents Rash   Patient states he was instructed not to take IV contrast.  In 2008 he had an unknown reaction, and was told not to take it again.  He was also told not to take it due to his kidneys.   Iodine Anxiety, Rash, Other (See Comments)   Didn't feel right "instructed not to take per  MD--something with his port"      Medication List       Accurate as of January 16, 2019  8:30 AM. If you have any questions, ask your nurse or doctor.        amLODipine 5 MG tablet Commonly known as: NORVASC Take 5 mg by mouth daily.   amLODipine 10 MG tablet Commonly known as: NORVASC Take 10 mg by mouth 2 (two) times daily.   atorvastatin 40 MG tablet Commonly known as: LIPITOR daily.   Carafate 1 GM/10ML suspension Generic drug: sucralfate Take 1 g by mouth 4 (four) times daily.   Clear Eyes All Seasons 5-6 MG/ML Soln Generic drug: Polyvinyl Alcohol-Povidone Place 2 drops into both eyes 3 (three) times daily as needed (drye eyes.).   cyclobenzaprine 10 MG tablet Commonly known as: FLEXERIL Take 10 mg by mouth 3 (three) times daily.   dicyclomine 10 MG capsule Commonly known as: BENTYL TAKE 1 CAPSULE BY MOUTH THREE TIMES A DAY   dronabinol 2.5 MG capsule Commonly known as: MARINOL Take 1 capsule (2.5 mg total) by mouth 2 (two) times daily before a meal.   Embeda 30-1.2 MG Cpcr Generic drug: Morphine-Naltrexone Take 3 (three) times daily by mouth.   Embeda 20-0.8 MG Cpcr Generic drug: Morphine-Naltrexone Take 1 capsule by mouth 3 (three) times daily.   furosemide 40 MG tablet Commonly known as: LASIX Take 40 mg by mouth daily.  gabapentin 300 MG capsule Commonly known as: NEURONTIN TAKE 1 CAPSULE BY MOUTH THREE TIMES A DAY   glimepiride 2 MG tablet Commonly known as: AMARYL Take 2 mg by mouth daily.   glipiZIDE 5 MG tablet Commonly known as: GLUCOTROL Take 5 mg by mouth 2 (two) times daily before a meal.   HYDROcodone-acetaminophen 5-325 MG tablet Commonly known as: Norco Take 1-2 tablets by mouth every 6 (six) hours as needed. What changed: reasons to take this   lactulose 10 GM/15ML solution Commonly known as: CHRONULAC TAKE 30 MLS (20 G TOTAL) BY MOUTH DAILY.   latanoprost 0.005 % ophthalmic solution Commonly known as: XALATAN Place 1  drop into the right eye every evening.   lidocaine-prilocaine cream Commonly known as: EMLA Apply 1 application topically as needed.   linaclotide 145 MCG Caps capsule Commonly known as: LINZESS Take 1 capsule (145 mcg total) by mouth daily before breakfast. What changed:   when to take this  reasons to take this   LORazepam 0.5 MG tablet Commonly known as: Ativan Take 1 tablet (0.5 mg total) by mouth every 6 (six) hours as needed (Nausea or vomiting).   losartan 25 MG tablet Commonly known as: COZAAR Take 25 mg by mouth.   losartan 50 MG tablet Commonly known as: COZAAR   mesalamine 1.2 g EC tablet Commonly known as: Lialda Take 1 tablet (1.2 g total) by mouth daily with breakfast.   metFORMIN 500 MG tablet Commonly known as: GLUCOPHAGE Take 500 mg by mouth 2 (two) times daily.   metoprolol succinate 100 MG 24 hr tablet Commonly known as: TOPROL-XL Take 100 mg by mouth daily.   mirabegron ER 50 MG Tb24 tablet Commonly known as: MYRBETRIQ Take 50 mg by mouth daily.   morphine 30 MG 12 hr tablet Commonly known as: MS CONTIN Take 30 mg by mouth 2 (two) times daily.   omeprazole 40 MG capsule Commonly known as: PRILOSEC Take 1 capsule (40 mg total) by mouth 2 (two) times daily.   ondansetron 8 MG tablet Commonly known as: ZOFRAN TAKE 1 TABLET(8 MG) BY MOUTH TWICE DAILY AS NEEDED FOR NAUSEA OR VOMITING What changed: See the new instructions.   orphenadrine 100 MG tablet Commonly known as: NORFLEX Take 1 tablet (100 mg total) by mouth 2 (two) times daily.   oxyCODONE-acetaminophen 10-325 MG tablet Commonly known as: PERCOCET Take 1 tablet by mouth every 6 (six) hours as needed for pain.   pioglitazone 30 MG tablet Commonly known as: ACTOS Take 30 mg by mouth daily.   potassium chloride SA 20 MEQ tablet Commonly known as: KLOR-CON Take 1 tablet (20 mEq total) by mouth 2 (two) times daily.   prochlorperazine 10 MG tablet Commonly known as: COMPAZINE  take 1 tablet by mouth every 6 hours if needed for nausea and vomiting What changed:   how much to take  how to take this  when to take this  reasons to take this   Relistor 150 MG Tabs Generic drug: Methylnaltrexone Bromide Take 150 mg by mouth.   Sennosides 25 MG Tabs Take 25 mg by mouth daily as needed (constipation).   torsemide 20 MG tablet Commonly known as: DEMADEX Take 20 mg by mouth. Take two tablets total of 40 mg by mouth daily.   Vitamin D (Ergocalciferol) 1.25 MG (50000 UT) Caps capsule Commonly known as: DRISDOL Take 50,000 Units by mouth every Saturday.   Xtampza ER 13.5 MG C12a Generic drug: oxyCODONE ER Take 1 capsule by  mouth 2 (two) times daily.   Zioptan 0.0015 % Soln Generic drug: Tafluprost (PF) Place 1 drop into both eyes daily.       Allergies:  Allergies  Allergen Reactions  . Iodinated Diagnostic Agents Rash    Patient states he was instructed not to take IV contrast.  In 2008 he had an unknown reaction, and was told not to take it again.  He was also told not to take it due to his kidneys.  . Iodine Anxiety, Rash and Other (See Comments)    Didn't feel right "instructed not to take per MD--something with his port"     Past Medical History, Surgical history, Social history, and Family History were reviewed and updated.  Review of Systems: Review of Systems  Constitutional: Positive for malaise/fatigue.  HENT: Negative.   Eyes: Negative.   Respiratory: Negative.   Cardiovascular: Negative.   Gastrointestinal: Positive for constipation.  Genitourinary: Negative.   Musculoskeletal: Positive for joint pain and myalgias.  Skin: Negative.   Endo/Heme/Allergies: Negative.   Psychiatric/Behavioral: Negative.       Physical Exam:  weight is 204 lb (92.5 kg). His oxygen saturation is 99%.   Wt Readings from Last 3 Encounters:  01/16/19 204 lb (92.5 kg)  01/16/19 204 lb (92.5 kg)  12/26/18 200 lb (90.7 kg)    Physical Exam  Vitals signs reviewed.  HENT:     Head: Normocephalic and atraumatic.  Eyes:     Pupils: Pupils are equal, round, and reactive to light.  Neck:     Musculoskeletal: Normal range of motion.  Cardiovascular:     Rate and Rhythm: Normal rate and regular rhythm.     Heart sounds: Normal heart sounds.  Pulmonary:     Effort: Pulmonary effort is normal.     Breath sounds: Normal breath sounds.  Abdominal:     General: Bowel sounds are normal.     Palpations: Abdomen is soft.  Musculoskeletal: Normal range of motion.        General: No tenderness or deformity.  Lymphadenopathy:     Cervical: No cervical adenopathy.  Skin:    General: Skin is warm and dry.     Findings: No erythema or rash.  Neurological:     Mental Status: He is alert and oriented to person, place, and time.  Psychiatric:        Behavior: Behavior normal.        Thought Content: Thought content normal.        Judgment: Judgment normal.      Lab Results  Component Value Date   WBC 5.8 01/16/2019   HGB 11.2 (L) 01/16/2019   HCT 34.5 (L) 01/16/2019   MCV 78.9 (L) 01/16/2019   PLT 175 01/16/2019   Lab Results  Component Value Date   FERRITIN 1,583 (H) 12/26/2018   IRON 85 12/26/2018   TIBC 273 12/26/2018   UIBC 187 12/26/2018   IRONPCTSAT 31 12/26/2018   Lab Results  Component Value Date   RETICCTPCT 0.6 07/25/2018   RBC 4.37 01/16/2019   RETICCTABS 27.5 03/21/2014   Lab Results  Component Value Date   KPAFRELGTCHN 35.6 (H) 12/26/2018   LAMBDASER 21.2 12/26/2018   KAPLAMBRATIO 1.68 (H) 12/26/2018   Lab Results  Component Value Date   IGGSERUM 1,045 12/26/2018   IGA 185 12/26/2018   IGMSERUM 34 12/26/2018   Lab Results  Component Value Date   TOTALPROTELP 7.4 12/26/2018   ALBUMINELP 4.1 12/26/2018   A1GS 0.2 12/26/2018  A2GS 1.0 12/26/2018   BETS 1.0 12/26/2018   BETA2SER 0.5 11/07/2014   GAMS 1.0 12/26/2018   MSPIKE Not Observed 12/26/2018   SPEI Comment 12/26/2018     Chemistry       Component Value Date/Time   NA 140 12/26/2018 0811   NA 143 02/22/2017 0744   K 3.5 12/26/2018 0811   K 4.0 02/22/2017 0744   CL 103 12/26/2018 0811   CL 106 02/22/2017 0744   CO2 29 12/26/2018 0811   CO2 27 02/22/2017 0744   BUN 18 12/26/2018 0811   BUN 26 (H) 02/22/2017 0744   CREATININE 2.14 (H) 12/26/2018 0811   CREATININE 2.3 (H) 02/22/2017 0744      Component Value Date/Time   CALCIUM 9.5 12/26/2018 0811   CALCIUM 9.3 02/22/2017 0744   ALKPHOS 73 12/26/2018 0811   ALKPHOS 80 02/22/2017 0744   AST 25 12/26/2018 0811   ALT 21 12/26/2018 0811   ALT 22 02/22/2017 0744   BILITOT 0.5 12/26/2018 0811       Impression and Plan: [name] is a very pleasant 68 yo African American gentleman with IgG kappa myeloma.  So far, the myeloma really has not been a problem for him.  His levels have been kept quite low.  We are only treating him really once every month.  He gets his testosterone injection monthly also.  He does not need Aranesp today.  I know that he has a lot of health issues.  His diabetes is a problem but I think he is doing pretty well with this.  He does have pain issues and is being managed by pain management.  We will plan to get him back to see [place] in another 3 weeks.   Volanda Napoleon, MD 11/23/20208:30 AM

## 2019-01-17 LAB — TESTOSTERONE: Testosterone: 543 ng/dL (ref 264–916)

## 2019-01-17 LAB — PROTEIN ELECTROPHORESIS, SERUM, WITH REFLEX
A/G Ratio: 1.2 (ref 0.7–1.7)
Albumin ELP: 3.5 g/dL (ref 2.9–4.4)
Alpha-1-Globulin: 0.2 g/dL (ref 0.0–0.4)
Alpha-2-Globulin: 1.1 g/dL — ABNORMAL HIGH (ref 0.4–1.0)
Beta Globulin: 0.8 g/dL (ref 0.7–1.3)
Gamma Globulin: 0.9 g/dL (ref 0.4–1.8)
Globulin, Total: 3 g/dL (ref 2.2–3.9)
Total Protein ELP: 6.5 g/dL (ref 6.0–8.5)

## 2019-01-17 LAB — IGG, IGA, IGM
IgA: 162 mg/dL (ref 61–437)
IgG (Immunoglobin G), Serum: 1011 mg/dL (ref 603–1613)
IgM (Immunoglobulin M), Srm: 30 mg/dL (ref 20–172)

## 2019-01-17 LAB — KAPPA/LAMBDA LIGHT CHAINS
Kappa free light chain: 29.1 mg/L — ABNORMAL HIGH (ref 3.3–19.4)
Kappa, lambda light chain ratio: 1.69 — ABNORMAL HIGH (ref 0.26–1.65)
Lambda free light chains: 17.2 mg/L (ref 5.7–26.3)

## 2019-01-27 ENCOUNTER — Other Ambulatory Visit: Payer: Self-pay | Admitting: *Deleted

## 2019-01-27 DIAGNOSIS — C9000 Multiple myeloma not having achieved remission: Secondary | ICD-10-CM

## 2019-01-27 DIAGNOSIS — G629 Polyneuropathy, unspecified: Secondary | ICD-10-CM

## 2019-01-27 MED ORDER — GABAPENTIN 300 MG PO CAPS: ORAL_CAPSULE | ORAL | 1 refills | Status: DC

## 2019-02-02 ENCOUNTER — Other Ambulatory Visit: Payer: Self-pay | Admitting: *Deleted

## 2019-02-02 DIAGNOSIS — C9001 Multiple myeloma in remission: Secondary | ICD-10-CM

## 2019-02-02 DIAGNOSIS — K5909 Other constipation: Secondary | ICD-10-CM

## 2019-02-02 MED ORDER — LACTULOSE 10 GM/15ML PO SOLN: 20.0000 g | Freq: Every day | ORAL | 1 refills | Status: DC

## 2019-02-06 ENCOUNTER — Other Ambulatory Visit: Payer: Self-pay

## 2019-02-06 ENCOUNTER — Inpatient Hospital Stay: Payer: Medicare Other

## 2019-02-06 ENCOUNTER — Telehealth: Payer: Self-pay | Admitting: Hematology & Oncology

## 2019-02-06 ENCOUNTER — Encounter: Payer: Self-pay | Admitting: Family

## 2019-02-06 ENCOUNTER — Inpatient Hospital Stay (HOSPITAL_BASED_OUTPATIENT_CLINIC_OR_DEPARTMENT_OTHER): Payer: Medicare Other | Admitting: Family

## 2019-02-06 ENCOUNTER — Inpatient Hospital Stay: Payer: Medicare Other | Attending: Hematology & Oncology

## 2019-02-06 VITALS — BP 157/69 | HR 89 | Temp 98.7°F | Resp 19 | Ht 71.0 in | Wt 206.0 lb

## 2019-02-06 DIAGNOSIS — D631 Anemia in chronic kidney disease: Secondary | ICD-10-CM | POA: Diagnosis not present

## 2019-02-06 DIAGNOSIS — N189 Chronic kidney disease, unspecified: Secondary | ICD-10-CM

## 2019-02-06 DIAGNOSIS — Z5111 Encounter for antineoplastic chemotherapy: Secondary | ICD-10-CM | POA: Diagnosis present

## 2019-02-06 DIAGNOSIS — D696 Thrombocytopenia, unspecified: Secondary | ICD-10-CM | POA: Diagnosis not present

## 2019-02-06 DIAGNOSIS — Z299 Encounter for prophylactic measures, unspecified: Secondary | ICD-10-CM

## 2019-02-06 DIAGNOSIS — E349 Endocrine disorder, unspecified: Secondary | ICD-10-CM

## 2019-02-06 DIAGNOSIS — M4712 Other spondylosis with myelopathy, cervical region: Secondary | ICD-10-CM

## 2019-02-06 DIAGNOSIS — Z5112 Encounter for antineoplastic immunotherapy: Secondary | ICD-10-CM | POA: Diagnosis present

## 2019-02-06 DIAGNOSIS — C9000 Multiple myeloma not having achieved remission: Secondary | ICD-10-CM

## 2019-02-06 LAB — CMP (CANCER CENTER ONLY)
ALT: 29 U/L (ref 0–44)
AST: 20 U/L (ref 15–41)
Albumin: 4.2 g/dL (ref 3.5–5.0)
Alkaline Phosphatase: 64 U/L (ref 38–126)
Anion gap: 9 (ref 5–15)
BUN: 24 mg/dL — ABNORMAL HIGH (ref 8–23)
CO2: 28 mmol/L (ref 22–32)
Calcium: 8.9 mg/dL (ref 8.9–10.3)
Chloride: 106 mmol/L (ref 98–111)
Creatinine: 2.3 mg/dL — ABNORMAL HIGH (ref 0.61–1.24)
GFR, Est AFR Am: 32 mL/min — ABNORMAL LOW (ref >60)
GFR, Estimated: 28 mL/min — ABNORMAL LOW (ref >60)
Glucose, Bld: 180 mg/dL — ABNORMAL HIGH (ref 70–99)
Potassium: 3.8 mmol/L (ref 3.5–5.1)
Sodium: 143 mmol/L (ref 135–145)
Total Bilirubin: 0.4 mg/dL (ref 0.3–1.2)
Total Protein: 6.8 g/dL (ref 6.5–8.1)

## 2019-02-06 LAB — CBC WITH DIFFERENTIAL (CANCER CENTER ONLY)
Abs Immature Granulocytes: 0.01 10*3/uL (ref 0.00–0.07)
Basophils Absolute: 0 10*3/uL (ref 0.0–0.1)
Basophils Relative: 1 %
Eosinophils Absolute: 0.1 10*3/uL (ref 0.0–0.5)
Eosinophils Relative: 3 %
HCT: 32.4 % — ABNORMAL LOW (ref 39.0–52.0)
Hemoglobin: 10.3 g/dL — ABNORMAL LOW (ref 13.0–17.0)
Immature Granulocytes: 0 %
Lymphocytes Relative: 16 %
Lymphs Abs: 0.7 10*3/uL (ref 0.7–4.0)
MCH: 25.7 pg — ABNORMAL LOW (ref 26.0–34.0)
MCHC: 31.8 g/dL (ref 30.0–36.0)
MCV: 80.8 fL (ref 80.0–100.0)
Monocytes Absolute: 0.4 10*3/uL (ref 0.1–1.0)
Monocytes Relative: 9 %
Neutro Abs: 3 10*3/uL (ref 1.7–7.7)
Neutrophils Relative %: 71 %
Platelet Count: 105 10*3/uL — ABNORMAL LOW (ref 150–400)
RBC: 4.01 MIL/uL — ABNORMAL LOW (ref 4.22–5.81)
RDW: 18.1 % — ABNORMAL HIGH (ref 11.5–15.5)
WBC Count: 4.1 10*3/uL (ref 4.0–10.5)
nRBC: 0 % (ref 0.0–0.2)

## 2019-02-06 LAB — IRON AND TIBC
Iron: 131 ug/dL (ref 42–163)
Saturation Ratios: 51 % (ref 20–55)
TIBC: 259 ug/dL (ref 202–409)
UIBC: 127 ug/dL (ref 117–376)

## 2019-02-06 LAB — LACTATE DEHYDROGENASE: LDH: 243 U/L — ABNORMAL HIGH (ref 98–192)

## 2019-02-06 LAB — FERRITIN: Ferritin: 1617 ng/mL — ABNORMAL HIGH (ref 24–336)

## 2019-02-06 MED ORDER — TESTOSTERONE CYPIONATE 200 MG/ML IM SOLN
INTRAMUSCULAR | Status: AC
  Filled 2019-02-06: qty 2

## 2019-02-06 MED ORDER — SODIUM CHLORIDE 0.9% FLUSH
10.0000 mL | INTRAVENOUS | Status: DC | PRN
  Administered 2019-02-06: 10 mL
  Filled 2019-02-06: qty 10

## 2019-02-06 MED ORDER — PALONOSETRON HCL INJECTION 0.25 MG/5ML
0.2500 mg | Freq: Once | INTRAVENOUS | Status: AC
  Administered 2019-02-06: 0.25 mg via INTRAVENOUS

## 2019-02-06 MED ORDER — HEPARIN SOD (PORK) LOCK FLUSH 100 UNIT/ML IV SOLN
500.0000 [IU] | Freq: Once | INTRAVENOUS | Status: AC | PRN
  Administered 2019-02-06: 500 [IU]
  Filled 2019-02-06: qty 5

## 2019-02-06 MED ORDER — HEPARIN SOD (PORK) LOCK FLUSH 100 UNIT/ML IV SOLN
250.0000 [IU] | Freq: Once | INTRAVENOUS | Status: DC | PRN
  Filled 2019-02-06: qty 5

## 2019-02-06 MED ORDER — TESTOSTERONE CYPIONATE 200 MG/ML IM SOLN
400.0000 mg | Freq: Once | INTRAMUSCULAR | Status: AC
  Administered 2019-02-06: 400 mg via INTRAMUSCULAR

## 2019-02-06 MED ORDER — SODIUM CHLORIDE 0.9 % IV SOLN
Freq: Once | INTRAVENOUS | Status: AC
  Administered 2019-02-06: 10:00:00 via INTRAVENOUS
  Filled 2019-02-06: qty 250

## 2019-02-06 MED ORDER — HYDROMORPHONE HCL 1 MG/ML IJ SOLN
2.0000 mg | Freq: Once | INTRAMUSCULAR | Status: AC
  Administered 2019-02-06: 2 mg via INTRAVENOUS

## 2019-02-06 MED ORDER — DEXAMETHASONE SODIUM PHOSPHATE 10 MG/ML IJ SOLN
10.0000 mg | Freq: Once | INTRAMUSCULAR | Status: AC
  Administered 2019-02-06: 10:00:00 10 mg via INTRAVENOUS

## 2019-02-06 MED ORDER — HYDROMORPHONE HCL 1 MG/ML IJ SOLN
INTRAMUSCULAR | Status: AC
  Filled 2019-02-06: qty 2

## 2019-02-06 MED ORDER — DEXAMETHASONE SODIUM PHOSPHATE 10 MG/ML IJ SOLN
INTRAMUSCULAR | Status: AC
  Filled 2019-02-06: qty 1

## 2019-02-06 MED ORDER — DEXTROSE 5 % IV SOLN
29.0000 mg/m2 | Freq: Once | INTRAVENOUS | Status: AC
  Administered 2019-02-06: 60 mg via INTRAVENOUS
  Filled 2019-02-06: qty 30

## 2019-02-06 MED ORDER — DARBEPOETIN ALFA 300 MCG/0.6ML IJ SOSY: 300.0000 ug | PREFILLED_SYRINGE | Freq: Once | INTRAMUSCULAR | Status: DC

## 2019-02-06 MED ORDER — ALTEPLASE 2 MG IJ SOLR
2.0000 mg | Freq: Once | INTRAMUSCULAR | Status: DC | PRN
  Filled 2019-02-06: qty 2

## 2019-02-06 MED ORDER — SODIUM CHLORIDE 0.9 % IV SOLN
300.0000 mg/m2 | Freq: Once | INTRAVENOUS | Status: AC
  Administered 2019-02-06: 11:00:00 620 mg via INTRAVENOUS
  Filled 2019-02-06: qty 31

## 2019-02-06 MED ORDER — SODIUM CHLORIDE 0.9% FLUSH
3.0000 mL | Freq: Once | INTRAVENOUS | Status: DC | PRN
  Filled 2019-02-06: qty 10

## 2019-02-06 MED ORDER — PALONOSETRON HCL INJECTION 0.25 MG/5ML
INTRAVENOUS | Status: AC
  Filled 2019-02-06: qty 5

## 2019-02-06 NOTE — Patient Instructions (Addendum)
[facility] Discharge Instructions for Patients Receiving Chemotherapy  Today you received the following chemotherapy agents Kyprolis, Cytoxan   To help prevent nausea and vomiting after your treatment, we encourage you to take your nausea medication' \[id number]' \   If you develop nausea and vomiting that is not controlled by your nausea medication, call the clinic.   BELOW ARE SYMPTOMS THAT SHOULD BE REPORTED IMMEDIATELY:  *FEVER GREATER THAN 100.5 F  *CHILLS WITH OR WITHOUT FEVER  NAUSEA AND VOMITING THAT IS NOT CONTROLLED WITH YOUR NAUSEA MEDICATION  *UNUSUAL SHORTNESS OF BREATH  *UNUSUAL BRUISING OR BLEEDING  TENDERNESS IN MOUTH AND THROAT WITH OR WITHOUT PRESENCE OF ULCERS  *URINARY PROBLEMS  *BOWEL PROBLEMS  UNUSUAL RASH Items with * indicate a potential emergency and should be followed up as soon as possible.  Feel free to call the clinic should you have any questions or concerns. The clinic phone number is [phone].  Please show the [facility] at check-in to the Emergency Department and triage nurse.  Cyclophosphamide injection What is this medicine? CYCLOPHOSPHAMIDE (sye kloe FOSS fa mide) is a chemotherapy drug. It slows the growth of cancer cells. This medicine is used to treat many types of cancer like lymphoma, myeloma, leukemia, breast cancer, and ovarian cancer, to name a few. This medicine may be used for other purposes; ask your health care provider or pharmacist if you have questions. COMMON BRAND NAME(S): Cytoxan, Neosar What should I tell my health care provider before I take this medicine? They need to know if you have any of these conditions:  blood disorders  history of other chemotherapy  infection  kidney disease  liver disease  recent or ongoing radiation therapy  tumors in the bone marrow  an unusual or allergic reaction to cyclophosphamide, other chemotherapy, other medicines, foods, dyes, or  preservatives  pregnant or trying to get pregnant  breast-feeding How should I use this medicine? This drug is usually given as an injection into a vein or muscle or by infusion into a vein. It is administered in a hospital or clinic by a specially trained health care professional. Talk to your pediatrician regarding the use of this medicine in children. Special care may be needed. Overdosage: If you think you have taken too much of this medicine contact a poison control center or emergency room at once. NOTE: This medicine is only for you. Do not share this medicine with others. What if I miss a dose? It is important not to miss your dose. Call your doctor or health care professional if you are unable to keep an appointment. What may interact with this medicine? This medicine may interact with the following medications:  amiodarone  amphotericin B  azathioprine  certain antiviral medicines for HIV or AIDS such as protease inhibitors (e.g., indinavir, ritonavir) and zidovudine  certain blood pressure medications such as benazepril, captopril, enalapril, fosinopril, lisinopril, moexipril, monopril, perindopril, quinapril, ramipril, trandolapril  certain cancer medications such as anthracyclines (e.g., daunorubicin, doxorubicin), busulfan, cytarabine, paclitaxel, pentostatin, tamoxifen, trastuzumab  certain diuretics such as chlorothiazide, chlorthalidone, hydrochlorothiazide, indapamide, metolazone  certain medicines that treat or prevent blood clots like warfarin  certain muscle relaxants such as succinylcholine  cyclosporine  etanercept  indomethacin  medicines to increase blood counts like filgrastim, pegfilgrastim, sargramostim  medicines used as general anesthesia  metronidazole  natalizumab This list may not describe all possible interactions. Give your health care provider a list of all the medicines, herbs, non-prescription drugs, or dietary supplements you  use.  Also tell them if you smoke, drink alcohol, or use illegal drugs. Some items may interact with your medicine. What should I watch for while using this medicine? Visit your doctor for checks on your progress. This drug may make you feel generally unwell. This is not uncommon, as chemotherapy can affect healthy cells as well as cancer cells. Report any side effects. Continue your course of treatment even though you feel ill unless your doctor tells you to stop. Drink water or other fluids as directed. Urinate often, even at night. In some cases, you may be given additional medicines to help with side effects. Follow all directions for their use. Call your doctor or health care professional for advice if you get a fever, chills or sore throat, or other symptoms of a cold or flu. Do not treat yourself. This drug decreases your body's ability to fight infections. Try to avoid being around people who are sick. This medicine may increase your risk to bruise or bleed. Call your doctor or health care professional if you notice any unusual bleeding. Be careful brushing and flossing your teeth or using a toothpick because you may get an infection or bleed more easily. If you have any dental work done, tell your dentist you are receiving this medicine. You may get drowsy or dizzy. Do not drive, use machinery, or do anything that needs mental alertness until you know how this medicine affects you. Do not become pregnant while taking this medicine or for 1 year after stopping it. Women should inform their doctor if they wish to become pregnant or think they might be pregnant. Men should not father a child while taking this medicine and for 4 months after stopping it. There is a potential for serious side effects to an unborn child. Talk to your health care professional or pharmacist for more information. Do not breast-feed an infant while taking this medicine. This medicine may interfere with the ability to have a  child. This medicine has caused ovarian failure in some women. This medicine has caused reduced sperm counts in some men. You should talk with your doctor or health care professional if you are concerned about your fertility. If you are going to have surgery, tell your doctor or health care professional that you have taken this medicine. What side effects may I notice from receiving this medicine? Side effects that you should report to your doctor or health care professional as soon as possible:  allergic reactions like skin rash, itching or hives, swelling of the face, lips, or tongue  low blood counts - this medicine may decrease the number of white blood cells, red blood cells and platelets. You may be at increased risk for infections and bleeding.  signs of infection - fever or chills, cough, sore throat, pain or difficulty passing urine  signs of decreased platelets or bleeding - bruising, pinpoint red spots on the skin, black, tarry stools, blood in the urine  signs of decreased red blood cells - unusually weak or tired, fainting spells, lightheadedness  breathing problems  dark urine  dizziness  palpitations  swelling of the ankles, feet, hands  trouble passing urine or change in the amount of urine  weight gain  yellowing of the eyes or skin Side effects that usually do not require medical attention (report to your doctor or health care professional if they continue or are bothersome):  changes in nail or skin color  hair loss  missed menstrual periods  mouth sores  nausea, vomiting This list may not describe all possible side effects. Call your doctor for medical advice about side effects. You may report side effects to FDA at 1-800-FDA-1088. Where should I keep my medicine? This drug is given in a hospital or clinic and will not be stored at home. NOTE: This sheet is a summary. It may not cover all possible information. If you have questions about this medicine,  talk to your doctor, pharmacist, or health care provider.  2020 Elsevier/Gold Standard (2011-12-25 16:22:58) Carfilzomib injection What is this medicine? CARFILZOMIB (kar FILZ oh mib) targets a specific protein within cancer cells and stops the cancer cells from growing. It is used to treat multiple myeloma. This medicine may be used for other purposes; ask your health care provider or pharmacist if you have questions. COMMON BRAND NAME(S): KYPROLIS What should I tell my health care provider before I take this medicine? They need to know if you have any of these conditions:  heart disease  history of blood clots  irregular heartbeat  kidney disease  liver disease  lung or breathing disease  an unusual or allergic reaction to carfilzomib, or other medicines, foods, dyes, or preservatives  pregnant or trying to get pregnant  breast-feeding How should I use this medicine? This medicine is for injection or infusion into a vein. It is given by a health care professional in a hospital or clinic setting. Talk to your pediatrician regarding the use of this medicine in children. Special care may be needed. Overdosage: If you think you have taken too much of this medicine contact a poison control center or emergency room at once. NOTE: This medicine is only for you. Do not share this medicine with others. What if I miss a dose? It is important not to miss your dose. Call your doctor or health care professional if you are unable to keep an appointment. What may interact with this medicine? Interactions are not expected. Give your health care provider a list of all the medicines, herbs, non-prescription drugs, or dietary supplements you use. Also tell them if you smoke, drink alcohol, or use illegal drugs. Some items may interact with your medicine. This list may not describe all possible interactions. Give your health care provider a list of all the medicines, herbs, non-prescription  drugs, or dietary supplements you use. Also tell them if you smoke, drink alcohol, or use illegal drugs. Some items may interact with your medicine. What should I watch for while using this medicine? Your condition will be monitored carefully while you are receiving this medicine. Report any side effects. Continue your course of treatment even though you feel ill unless your doctor tells you to stop. You may need blood work done while you are taking this medicine. Do not become pregnant while taking this medicine or for at least 6 months after stopping it. Women should inform their doctor if they wish to become pregnant or think they might be pregnant. There is a potential for serious side effects to an unborn child. Men should not father a child while taking this medicine and for at least 3 months after stopping it. Talk to your health care professional or pharmacist for more information. Do not breast-feed an infant while taking this medicine or for 2 weeks after the last dose. Check with your doctor or health care professional if you get an attack of severe diarrhea, nausea and vomiting, or if you sweat a lot. The loss of too much body fluid can make  it dangerous for you to take this medicine. You may get dizzy. Do not drive, use machinery, or do anything that needs mental alertness until you know how this medicine affects you. Do not stand or sit up quickly, especially if you are an older patient. This reduces the risk of dizzy or fainting spells. What side effects may I notice from receiving this medicine? Side effects that you should report to your doctor or health care professional as soon as possible:  allergic reactions like skin rash, itching or hives, swelling of the face, lips, or tongue  confusion  dizziness  feeling faint or lightheaded  fever or chills  palpitations  seizures  signs and symptoms of bleeding such as bloody or black, tarry stools; red or dark-brown urine;  spitting up blood or brown material that looks like coffee grounds; red spots on the skin; unusual bruising or bleeding including from the eye, gums, or nose  signs and symptoms of a blood clot such as breathing problems; changes in vision; chest pain; severe, sudden headache; pain, swelling, warmth in the leg; trouble speaking; sudden numbness or weakness of the face, arm or leg  signs and symptoms of kidney injury like trouble passing urine or change in the amount of urine  signs and symptoms of liver injury like dark yellow or brown urine; general ill feeling or flu-like symptoms; light-colored stools; loss of appetite; nausea; right upper belly pain; unusually weak or tired; yellowing of the eyes or skin Side effects that usually do not require medical attention (report to your doctor or health care professional if they continue or are bothersome):  back pain  cough  diarrhea  headache  muscle cramps  vomiting This list may not describe all possible side effects. Call your doctor for medical advice about side effects. You may report side effects to FDA at 1-800-FDA-1088. Where should I keep my medicine? This drug is given in a hospital or clinic and will not be stored at home. NOTE: This sheet is a summary. It may not cover all possible information. If you have questions about this medicine, talk to your doctor, pharmacist, or health care provider.  2020 Elsevier/Gold Standard (2016-11-25 14:07:13)

## 2019-02-06 NOTE — Progress Notes (Addendum)
Hematology and Oncology Follow Up Visit  [name] [id number] [birth year] 68 y.o. 02/06/2019   Principle Diagnosis:  IgG kappa myeloma  Anemia secondary to renal insufficiency  Intermittent iron - deficiency anemia  Hypotestosteronemia  Current Therapy:   Aredia 60 mg IV q 3 months-- next dose01/2021 Aranesp 300 mcg subcu as needed for hemoglobin less than 11 DepoTestosterone400 mg q4weeks IV iron as indicated Kyprolis/Cytoxan - s/p cycle22   Interim History:  [name] is here today for follow-up and treatment. He is having a lot of joint aches and pains secondary to arthritis. The cold rainy weather is rough for him. He has some fatigue at times.  November M-spike not observed, IgG level 1,011 and kappa light chains 2.91 mg/dL.  Testosterone was up to 543. Platelet count is 105, Hgb 10.3 and MCV 80. He denies any issues with bleeding. No bruising or petechiae.  No fever, chills, n/v, cough, rash, dizziness, SOB, chest pain, palpitations, abdominal pain or changes in bowel or bladder habits.  He takes Lactulose as needed for constipation.  No swelling in his extremities.  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  No falls or syncopal episodes to report.  He states that he is eating well and staying properly hydrated. His weight is stable.   ECOG Performance Status: 1 - Symptomatic but completely ambulatory  Medications:  Allergies as of 02/06/2019      Reactions   Iodinated Diagnostic Agents Rash   Patient states he was instructed not to take IV contrast.  In 2008 he had an unknown reaction, and was told not to take it again.  He was also told not to take it due to his kidneys.   Iodine Anxiety, Rash, Other (See Comments)   Didn't feel right "instructed not to take per MD--something with his port"      Medication List       Accurate as of February 06, 2019  9:14 AM. If you have any questions, ask your nurse or doctor.          amLODipine 10 MG tablet Commonly known as: NORVASC Take 10 mg by mouth 2 (two) times daily. What changed: Another medication with the same name was removed. Continue taking this medication, and follow the directions you see here. Changed by: Laverna Peace, NP   atorvastatin 40 MG tablet Commonly known as: LIPITOR daily.   Carafate 1 GM/10ML suspension Generic drug: sucralfate Take 1 g by mouth 4 (four) times daily.   Clear Eyes All Seasons 5-6 MG/ML Soln Generic drug: Polyvinyl Alcohol-Povidone Place 2 drops into both eyes 3 (three) times daily as needed (drye eyes.).   cyclobenzaprine 10 MG tablet Commonly known as: FLEXERIL Take 10 mg by mouth 3 (three) times daily.   dicyclomine 10 MG capsule Commonly known as: BENTYL TAKE 1 CAPSULE BY MOUTH THREE TIMES A DAY   dronabinol 2.5 MG capsule Commonly known as: MARINOL Take 1 capsule (2.5 mg total) by mouth 2 (two) times daily before a meal.   Embeda 30-1.2 MG Cpcr Generic drug: Morphine-Naltrexone Take 3 (three) times daily by mouth.   Embeda 20-0.8 MG Cpcr Generic drug: Morphine-Naltrexone Take 1 capsule by mouth 3 (three) times daily.   furosemide 40 MG tablet Commonly known as: LASIX Take 40 mg by mouth daily.   gabapentin 300 MG capsule Commonly known as: NEURONTIN Take one capsule by mouth three times a day.   glimepiride 2 MG tablet Commonly known as: AMARYL Take 2  mg by mouth daily.   glipiZIDE 5 MG tablet Commonly known as: GLUCOTROL Take 5 mg by mouth 2 (two) times daily before a meal.   HYDROcodone-acetaminophen 5-325 MG tablet Commonly known as: Norco Take 1-2 tablets by mouth every 6 (six) hours as needed. What changed: reasons to take this   lactulose 10 GM/15ML solution Commonly known as: CHRONULAC Take 30 mLs (20 g total) by mouth daily.   latanoprost 0.005 % ophthalmic solution Commonly known as: XALATAN Place 1 drop into the right eye every evening.   lidocaine-prilocaine  cream Commonly known as: EMLA Apply 1 application topically as needed.   linaclotide 145 MCG Caps capsule Commonly known as: LINZESS Take 1 capsule (145 mcg total) by mouth daily before breakfast. What changed:   when to take this  reasons to take this   LORazepam 0.5 MG tablet Commonly known as: Ativan Take 1 tablet (0.5 mg total) by mouth every 6 (six) hours as needed (Nausea or vomiting).   losartan 25 MG tablet Commonly known as: COZAAR Take 25 mg by mouth. What changed: Another medication with the same name was removed. Continue taking this medication, and follow the directions you see here. Changed by: Laverna Peace, NP   mesalamine 1.2 g EC tablet Commonly known as: Lialda Take 1 tablet (1.2 g total) by mouth daily with breakfast.   metFORMIN 500 MG tablet Commonly known as: GLUCOPHAGE Take 500 mg by mouth 2 (two) times daily.   metoprolol succinate 100 MG 24 hr tablet Commonly known as: TOPROL-XL Take 100 mg by mouth daily.   mirabegron ER 50 MG Tb24 tablet Commonly known as: MYRBETRIQ Take 50 mg by mouth daily.   morphine 30 MG 12 hr tablet Commonly known as: MS CONTIN Take 30 mg by mouth 2 (two) times daily.   omeprazole 40 MG capsule Commonly known as: PRILOSEC Take 1 capsule (40 mg total) by mouth 2 (two) times daily.   ondansetron 8 MG tablet Commonly known as: ZOFRAN TAKE 1 TABLET(8 MG) BY MOUTH TWICE DAILY AS NEEDED FOR NAUSEA OR VOMITING What changed: See the new instructions.   orphenadrine 100 MG tablet Commonly known as: NORFLEX Take 1 tablet (100 mg total) by mouth 2 (two) times daily.   oxyCODONE-acetaminophen 10-325 MG tablet Commonly known as: PERCOCET Take 1 tablet by mouth every 6 (six) hours as needed for pain.   pioglitazone 30 MG tablet Commonly known as: ACTOS Take 30 mg by mouth daily.   potassium chloride SA 20 MEQ tablet Commonly known as: KLOR-CON Take 1 tablet (20 mEq total) by mouth 2 (two) times daily.    prochlorperazine 10 MG tablet Commonly known as: COMPAZINE take 1 tablet by mouth every 6 hours if needed for nausea and vomiting What changed:   how much to take  how to take this  when to take this  reasons to take this   Relistor 150 MG Tabs Generic drug: Methylnaltrexone Bromide Take 150 mg by mouth.   Sennosides 25 MG Tabs Take 25 mg by mouth daily as needed (constipation).   torsemide 20 MG tablet Commonly known as: DEMADEX Take 20 mg by mouth. Take two tablets total of 40 mg by mouth daily.   Vitamin D (Ergocalciferol) 1.25 MG (50000 UT) Caps capsule Commonly known as: DRISDOL Take 50,000 Units by mouth every Saturday.   Xtampza ER 13.5 MG C12a Generic drug: oxyCODONE ER Take 1 capsule by mouth 2 (two) times daily.   Zioptan 0.0015 % Soln Generic drug:  Tafluprost (PF) Place 1 drop into both eyes daily.       Allergies:  Allergies  Allergen Reactions  . Iodinated Diagnostic Agents Rash    Patient states he was instructed not to take IV contrast.  In 2008 he had an unknown reaction, and was told not to take it again.  He was also told not to take it due to his kidneys.  . Iodine Anxiety, Rash and Other (See Comments)    Didn't feel right "instructed not to take per MD--something with his port"     Past Medical History, Surgical history, Social history, and Family History were reviewed and updated.  Review of Systems: All other 10 point review of systems is negative.   Physical Exam:  height is 5\' 11"  (1.803 m) and weight is 206 lb (93.4 kg). His temporal temperature is 98.7 F (37.1 C). His blood pressure is 157/69 (abnormal) and his pulse is 89. His respiration is 19 and oxygen saturation is 99%.   Wt Readings from Last 3 Encounters:  02/06/19 206 lb (93.4 kg)  01/16/19 204 lb (92.5 kg)  01/16/19 204 lb (92.5 kg)    Ocular: Sclerae unicteric, pupils equal, round and reactive to light Ear-nose-throat: Oropharynx clear, dentition  fair Lymphatic: No cervical or supraclavicular adenopathy Lungs no rales or rhonchi, good excursion bilaterally Heart regular rate and rhythm, no murmur appreciated Abd soft, nontender, positive bowel sounds, no liver or spleen tip palpated on exam, no fluid wave  MSK no focal spinal tenderness, no joint edema Neuro: non-focal, well-oriented, appropriate affect Breasts: Deferred   Lab Results  Component Value Date   WBC 4.1 02/06/2019   HGB 10.3 (L) 02/06/2019   HCT 32.4 (L) 02/06/2019   MCV 80.8 02/06/2019   PLT 105 (L) 02/06/2019   Lab Results  Component Value Date   FERRITIN 1,392 (H) 01/16/2019   IRON 96 01/16/2019   TIBC 246 01/16/2019   UIBC 150 01/16/2019   IRONPCTSAT 39 01/16/2019   Lab Results  Component Value Date   RETICCTPCT 0.6 07/25/2018   RBC 4.01 (L) 02/06/2019   RETICCTABS 27.5 03/21/2014   Lab Results  Component Value Date   KPAFRELGTCHN 29.1 (H) 01/16/2019   LAMBDASER 17.2 01/16/2019   KAPLAMBRATIO 1.69 (H) 01/16/2019   Lab Results  Component Value Date   IGGSERUM 1,011 01/16/2019   IGA 162 01/16/2019   IGMSERUM 30 01/16/2019   Lab Results  Component Value Date   TOTALPROTELP 6.5 01/16/2019   ALBUMINELP 3.5 01/16/2019   A1GS 0.2 01/16/2019   A2GS 1.1 (H) 01/16/2019   BETS 0.8 01/16/2019   BETA2SER 0.5 11/07/2014   GAMS 0.9 01/16/2019   MSPIKE Not Observed 01/16/2019   SPEI Comment 12/26/2018     Chemistry      Component Value Date/Time   NA 143 02/06/2019 0820   NA 143 02/22/2017 0744   K 3.8 02/06/2019 0820   K 4.0 02/22/2017 0744   CL 106 02/06/2019 0820   CL 106 02/22/2017 0744   CO2 28 02/06/2019 0820   CO2 27 02/22/2017 0744   BUN 24 (H) 02/06/2019 0820   BUN 26 (H) 02/22/2017 0744   CREATININE 2.30 (H) 02/06/2019 0820   CREATININE 2.3 (H) 02/22/2017 0744      Component Value Date/Time   CALCIUM 8.9 02/06/2019 0820   CALCIUM 9.3 02/22/2017 0744   ALKPHOS 64 02/06/2019 0820   ALKPHOS 80 02/22/2017 0744   AST 20  02/06/2019 0820   ALT 29 02/06/2019  0820   ALT 22 02/22/2017 0744   BILITOT 0.4 02/06/2019 0820       Impression and Plan: Mr. Justen is a very pleasant 68 yo African American gentleman with IgG kappa myeloma.  So far, this has remained stable. We will proceed with treatment today as planned.  No Aranesp needed for Hgb 10.3.  He did receive Testosterone today.  He is due again for Aredia in January.  We will see what his iron studies show and replace if needed.  We will plan to see him back in another 3 weeks.  He will contact our office with any questions or concerns. We can certainly see him sooner if needed.   Laverna Peace, NP 12/14/20209:14 AM

## 2019-02-06 NOTE — Telephone Encounter (Signed)
Has current schedule :) 12/14 los

## 2019-02-06 NOTE — Patient Instructions (Signed)

## 2019-02-07 LAB — KAPPA/LAMBDA LIGHT CHAINS
Kappa free light chain: 38.5 mg/L — ABNORMAL HIGH (ref 3.3–19.4)
Kappa, lambda light chain ratio: 1.77 — ABNORMAL HIGH (ref 0.26–1.65)
Lambda free light chains: 21.7 mg/L (ref 5.7–26.3)

## 2019-02-07 LAB — PROTEIN ELECTROPHORESIS, SERUM, WITH REFLEX
A/G Ratio: 1.3 (ref 0.7–1.7)
Albumin ELP: 3.7 g/dL (ref 2.9–4.4)
Alpha-1-Globulin: 0.2 g/dL (ref 0.0–0.4)
Alpha-2-Globulin: 0.9 g/dL (ref 0.4–1.0)
Beta Globulin: 0.9 g/dL (ref 0.7–1.3)
Gamma Globulin: 0.8 g/dL (ref 0.4–1.8)
Globulin, Total: 2.8 g/dL (ref 2.2–3.9)
Total Protein ELP: 6.5 g/dL (ref 6.0–8.5)

## 2019-02-07 LAB — IGG, IGA, IGM
IgA: 175 mg/dL (ref 61–437)
IgG (Immunoglobin G), Serum: 895 mg/dL (ref 603–1613)
IgM (Immunoglobulin M), Srm: 25 mg/dL (ref 20–172)

## 2019-02-07 LAB — TESTOSTERONE: Testosterone: 284 ng/dL (ref 264–916)

## 2019-02-27 ENCOUNTER — Other Ambulatory Visit: Payer: Self-pay

## 2019-02-27 ENCOUNTER — Telehealth: Payer: Self-pay | Admitting: Hematology & Oncology

## 2019-02-27 ENCOUNTER — Inpatient Hospital Stay: Payer: Medicare Other

## 2019-02-27 ENCOUNTER — Inpatient Hospital Stay (HOSPITAL_BASED_OUTPATIENT_CLINIC_OR_DEPARTMENT_OTHER): Payer: Medicare Other | Admitting: Hematology & Oncology

## 2019-02-27 ENCOUNTER — Inpatient Hospital Stay: Payer: Medicare Other | Attending: Hematology & Oncology

## 2019-02-27 ENCOUNTER — Encounter: Payer: Self-pay | Admitting: Hematology & Oncology

## 2019-02-27 VITALS — BP 146/66 | HR 89 | Temp 98.7°F | Resp 19 | Wt 206.0 lb

## 2019-02-27 DIAGNOSIS — D631 Anemia in chronic kidney disease: Secondary | ICD-10-CM

## 2019-02-27 DIAGNOSIS — C9000 Multiple myeloma not having achieved remission: Secondary | ICD-10-CM

## 2019-02-27 DIAGNOSIS — D696 Thrombocytopenia, unspecified: Secondary | ICD-10-CM

## 2019-02-27 DIAGNOSIS — Z5111 Encounter for antineoplastic chemotherapy: Secondary | ICD-10-CM | POA: Diagnosis not present

## 2019-02-27 DIAGNOSIS — Z299 Encounter for prophylactic measures, unspecified: Secondary | ICD-10-CM

## 2019-02-27 DIAGNOSIS — Z95828 Presence of other vascular implants and grafts: Secondary | ICD-10-CM

## 2019-02-27 DIAGNOSIS — E611 Iron deficiency: Secondary | ICD-10-CM | POA: Insufficient documentation

## 2019-02-27 DIAGNOSIS — Z79890 Hormone replacement therapy (postmenopausal): Secondary | ICD-10-CM | POA: Insufficient documentation

## 2019-02-27 DIAGNOSIS — N189 Chronic kidney disease, unspecified: Secondary | ICD-10-CM | POA: Diagnosis present

## 2019-02-27 DIAGNOSIS — E349 Endocrine disorder, unspecified: Secondary | ICD-10-CM

## 2019-02-27 DIAGNOSIS — Z5112 Encounter for antineoplastic immunotherapy: Secondary | ICD-10-CM | POA: Insufficient documentation

## 2019-02-27 DIAGNOSIS — M4712 Other spondylosis with myelopathy, cervical region: Secondary | ICD-10-CM

## 2019-02-27 LAB — CBC WITH DIFFERENTIAL (CANCER CENTER ONLY)
Abs Immature Granulocytes: 0.02 10*3/uL (ref 0.00–0.07)
Basophils Absolute: 0 10*3/uL (ref 0.0–0.1)
Basophils Relative: 1 %
Eosinophils Absolute: 0.1 10*3/uL (ref 0.0–0.5)
Eosinophils Relative: 2 %
HCT: 29.8 % — ABNORMAL LOW (ref 39.0–52.0)
Hemoglobin: 9.5 g/dL — ABNORMAL LOW (ref 13.0–17.0)
Immature Granulocytes: 1 %
Lymphocytes Relative: 18 %
Lymphs Abs: 0.8 10*3/uL (ref 0.7–4.0)
MCH: 25.8 pg — ABNORMAL LOW (ref 26.0–34.0)
MCHC: 31.9 g/dL (ref 30.0–36.0)
MCV: 81 fL (ref 80.0–100.0)
Monocytes Absolute: 0.6 10*3/uL (ref 0.1–1.0)
Monocytes Relative: 14 %
Neutro Abs: 2.7 10*3/uL (ref 1.7–7.7)
Neutrophils Relative %: 64 %
Platelet Count: 206 10*3/uL (ref 150–400)
RBC: 3.68 MIL/uL — ABNORMAL LOW (ref 4.22–5.81)
RDW: 18.4 % — ABNORMAL HIGH (ref 11.5–15.5)
WBC Count: 4.3 10*3/uL (ref 4.0–10.5)
nRBC: 0 % (ref 0.0–0.2)

## 2019-02-27 LAB — CMP (CANCER CENTER ONLY)
ALT: 11 U/L (ref 0–44)
AST: 15 U/L (ref 15–41)
Albumin: 4.3 g/dL (ref 3.5–5.0)
Alkaline Phosphatase: 62 U/L (ref 38–126)
Anion gap: 9 (ref 5–15)
BUN: 34 mg/dL — ABNORMAL HIGH (ref 8–23)
CO2: 28 mmol/L (ref 22–32)
Calcium: 9.4 mg/dL (ref 8.9–10.3)
Chloride: 104 mmol/L (ref 98–111)
Creatinine: 3.29 mg/dL (ref 0.61–1.24)
GFR, Est AFR Am: 21 mL/min — ABNORMAL LOW (ref >60)
GFR, Estimated: 18 mL/min — ABNORMAL LOW (ref >60)
Glucose, Bld: 85 mg/dL (ref 70–99)
Potassium: 3.6 mmol/L (ref 3.5–5.1)
Sodium: 141 mmol/L (ref 135–145)
Total Bilirubin: 0.4 mg/dL (ref 0.3–1.2)
Total Protein: 7 g/dL (ref 6.5–8.1)

## 2019-02-27 LAB — LACTATE DEHYDROGENASE: LDH: 219 U/L — ABNORMAL HIGH (ref 98–192)

## 2019-02-27 LAB — SAVE SMEAR(SSMR), FOR PROVIDER SLIDE REVIEW

## 2019-02-27 MED ORDER — DEXTROSE 5 % IV SOLN
28.6000 mg/m2 | Freq: Once | INTRAVENOUS | Status: AC
  Administered 2019-02-27: 60 mg via INTRAVENOUS
  Filled 2019-02-27: qty 30

## 2019-02-27 MED ORDER — SODIUM CHLORIDE 0.9% FLUSH
10.0000 mL | Freq: Once | INTRAVENOUS | Status: DC
  Filled 2019-02-27: qty 10

## 2019-02-27 MED ORDER — SODIUM CHLORIDE 0.9 % IV SOLN
Freq: Once | INTRAVENOUS | Status: AC
  Filled 2019-02-27: qty 250

## 2019-02-27 MED ORDER — PROCHLORPERAZINE MALEATE 10 MG PO TABS: ORAL_TABLET | ORAL | 1 refills | Status: DC

## 2019-02-27 MED ORDER — SODIUM CHLORIDE 0.9 % IV SOLN
300.0000 mg/m2 | Freq: Once | INTRAVENOUS | Status: AC
  Administered 2019-02-27: 620 mg via INTRAVENOUS
  Filled 2019-02-27: qty 31

## 2019-02-27 MED ORDER — PALONOSETRON HCL INJECTION 0.25 MG/5ML
0.2500 mg | Freq: Once | INTRAVENOUS | Status: AC
  Administered 2019-02-27: 0.25 mg via INTRAVENOUS

## 2019-02-27 MED ORDER — SODIUM CHLORIDE 0.9% FLUSH
10.0000 mL | INTRAVENOUS | Status: DC | PRN
  Administered 2019-02-27: 10 mL
  Filled 2019-02-27: qty 10

## 2019-02-27 MED ORDER — DEXAMETHASONE SODIUM PHOSPHATE 10 MG/ML IJ SOLN
INTRAMUSCULAR | Status: AC
  Filled 2019-02-27: qty 1

## 2019-02-27 MED ORDER — SODIUM CHLORIDE 0.9 % IV SOLN
Freq: Once | INTRAVENOUS | Status: DC
  Filled 2019-02-27: qty 250

## 2019-02-27 MED ORDER — HYDROMORPHONE HCL 1 MG/ML IJ SOLN
2.0000 mg | Freq: Once | INTRAMUSCULAR | Status: AC
  Administered 2019-02-27: 2 mg via INTRAVENOUS

## 2019-02-27 MED ORDER — ONDANSETRON HCL 8 MG PO TABS: ORAL_TABLET | ORAL | 0 refills | Status: DC

## 2019-02-27 MED ORDER — DEXAMETHASONE SODIUM PHOSPHATE 10 MG/ML IJ SOLN
10.0000 mg | Freq: Once | INTRAMUSCULAR | Status: AC
  Administered 2019-02-27: 10 mg via INTRAVENOUS

## 2019-02-27 MED ORDER — SODIUM CHLORIDE 0.9 % IV SOLN
60.0000 mg | Freq: Once | INTRAVENOUS | Status: AC
  Administered 2019-02-27: 60 mg via INTRAVENOUS
  Filled 2019-02-27: qty 20
  Filled 2019-02-27: qty 10

## 2019-02-27 MED ORDER — PALONOSETRON HCL INJECTION 0.25 MG/5ML
INTRAVENOUS | Status: AC
  Filled 2019-02-27: qty 5

## 2019-02-27 MED ORDER — HEPARIN SOD (PORK) LOCK FLUSH 100 UNIT/ML IV SOLN
500.0000 [IU] | Freq: Once | INTRAVENOUS | Status: AC | PRN
  Administered 2019-02-27: 500 [IU]
  Filled 2019-02-27: qty 5

## 2019-02-27 MED ORDER — HYDROMORPHONE HCL 1 MG/ML IJ SOLN
INTRAMUSCULAR | Status: AC
  Filled 2019-02-27: qty 2

## 2019-02-27 NOTE — Progress Notes (Signed)
Hematology and Oncology Follow Up Visit  [name] [id number] [birth year] 69 y.o. 02/27/2019   Principle Diagnosis:  IgG kappa myeloma  Anemia secondary to renal insufficiency  Intermittent iron - deficiency anemia  Hypotestosteronemia  Current Therapy:   Aredia 60 mg IV q 3 months-- next dose04/2021 Aranesp 300 mcg subcu as needed for hemoglobin less than 11 DepoTestosterone400 mg q4weeks IV iron as indicated Kyprolis/Cytoxan - s/p cycle#23   Interim History:  [name] is here today for follow-up and treatment. He is having a lot of joint aches and pains secondary to arthritis.  I think that he probably needs to see his arthritis specialist.  I know he is on different medications for his arthritis.  Thankfully, the myeloma has not been bad at all.  There is been no monoclonal spike in his blood.His IgG level was 900 mg/dL.  His last kappa light chain was 3.9 mg/dL.  He is had no problems with bowels or bladder.  His blood sugars have been on the high side because of the holidays.  He has had no problems with fever.  He has had no rashes.  The testosterone supplementation has helped him.  He gets his Aredia today.  Overall, I would say his performance status is ECOG 1.  Medications:  Allergies as of 02/27/2019      Reactions   Iodinated Diagnostic Agents Rash   Patient states he was instructed not to take IV contrast.  In 2008 he had an unknown reaction, and was told not to take it again.  He was also told not to take it due to his kidneys.   Iodine Anxiety, Rash, Other (See Comments)   Didn't feel right "instructed not to take per MD--something with his port"      Medication List       Accurate as of February 27, 2019  9:00 AM. If you have any questions, ask your nurse or doctor.        amLODipine 10 MG tablet Commonly known as: NORVASC Take 10 mg by mouth 2 (two) times daily.   atorvastatin 40 MG tablet Commonly known as: LIPITOR daily.   Carafate  1 GM/10ML suspension Generic drug: sucralfate Take 1 g by mouth 4 (four) times daily.   Clear Eyes All Seasons 5-6 MG/ML Soln Generic drug: Polyvinyl Alcohol-Povidone Place 2 drops into both eyes 3 (three) times daily as needed (drye eyes.).   cyclobenzaprine 10 MG tablet Commonly known as: FLEXERIL Take 10 mg by mouth 3 (three) times daily.   dicyclomine 10 MG capsule Commonly known as: BENTYL TAKE 1 CAPSULE BY MOUTH THREE TIMES A DAY   dronabinol 2.5 MG capsule Commonly known as: MARINOL Take 1 capsule (2.5 mg total) by mouth 2 (two) times daily before a meal.   Embeda 30-1.2 MG Cpcr Generic drug: Morphine-Naltrexone Take 3 (three) times daily by mouth.   Embeda 20-0.8 MG Cpcr Generic drug: Morphine-Naltrexone Take 1 capsule by mouth 3 (three) times daily.   furosemide 40 MG tablet Commonly known as: LASIX Take 40 mg by mouth daily.   gabapentin 300 MG capsule Commonly known as: NEURONTIN Take one capsule by mouth three times a day.   glimepiride 2 MG tablet Commonly known as: AMARYL Take 2 mg by mouth daily.   glipiZIDE 5 MG tablet Commonly known as: GLUCOTROL Take 5 mg by mouth 2 (two) times daily before a meal.   HYDROcodone-acetaminophen 5-325 MG tablet Commonly known as: Norco Take 1-2 tablets by mouth every 6 (  six) hours as needed. What changed: reasons to take this   lactulose 10 GM/15ML solution Commonly known as: CHRONULAC Take 30 mLs (20 g total) by mouth daily.   latanoprost 0.005 % ophthalmic solution Commonly known as: XALATAN Place 1 drop into the right eye every evening.   lidocaine-prilocaine cream Commonly known as: EMLA Apply 1 application topically as needed.   linaclotide 145 MCG Caps capsule Commonly known as: LINZESS Take 1 capsule (145 mcg total) by mouth daily before breakfast. What changed:   when to take this  reasons to take this   LORazepam 0.5 MG tablet Commonly known as: Ativan Take 1 tablet (0.5 mg total) by  mouth every 6 (six) hours as needed (Nausea or vomiting).   losartan 50 MG tablet Commonly known as: COZAAR Take 50 mg by mouth daily. What changed: Another medication with the same name was removed. Continue taking this medication, and follow the directions you see here. Changed by: Volanda Napoleon, MD   mesalamine 1.2 g EC tablet Commonly known as: Lialda Take 1 tablet (1.2 g total) by mouth daily with breakfast.   metFORMIN 500 MG tablet Commonly known as: GLUCOPHAGE Take 500 mg by mouth 2 (two) times daily.   metoprolol succinate 100 MG 24 hr tablet Commonly known as: TOPROL-XL Take 100 mg by mouth daily.   mirabegron ER 50 MG Tb24 tablet Commonly known as: MYRBETRIQ Take 50 mg by mouth daily.   morphine 30 MG 12 hr tablet Commonly known as: MS CONTIN Take 30 mg by mouth 2 (two) times daily.   omeprazole 40 MG capsule Commonly known as: PRILOSEC Take 1 capsule (40 mg total) by mouth 2 (two) times daily.   ondansetron 8 MG tablet Commonly known as: ZOFRAN TAKE 1 TABLET(8 MG) BY MOUTH TWICE DAILY AS NEEDED FOR NAUSEA OR VOMITING What changed: See the new instructions.   orphenadrine 100 MG tablet Commonly known as: NORFLEX Take 1 tablet (100 mg total) by mouth 2 (two) times daily.   oxyCODONE-acetaminophen 10-325 MG tablet Commonly known as: PERCOCET Take 1 tablet by mouth every 6 (six) hours as needed for pain.   pioglitazone 30 MG tablet Commonly known as: ACTOS Take 30 mg by mouth daily.   potassium chloride SA 20 MEQ tablet Commonly known as: KLOR-CON Take 1 tablet (20 mEq total) by mouth 2 (two) times daily.   prochlorperazine 10 MG tablet Commonly known as: COMPAZINE take 1 tablet by mouth every 6 hours if needed for nausea and vomiting What changed:   how much to take  how to take this  when to take this  reasons to take this   Relistor 150 MG Tabs Generic drug: Methylnaltrexone Bromide Take 150 mg by mouth.   Sennosides 25 MG Tabs Take  25 mg by mouth daily as needed (constipation).   torsemide 20 MG tablet Commonly known as: DEMADEX Take 20 mg by mouth. Take two tablets total of 40 mg by mouth daily.   Vitamin D (Ergocalciferol) 1.25 MG (50000 UT) Caps capsule Commonly known as: DRISDOL Take 50,000 Units by mouth every Saturday.   Xtampza ER 13.5 MG C12a Generic drug: oxyCODONE ER Take 1 capsule by mouth 2 (two) times daily.   Zioptan 0.0015 % Soln Generic drug: Tafluprost (PF) Place 1 drop into both eyes daily.       Allergies:  Allergies  Allergen Reactions  . Iodinated Diagnostic Agents Rash    Patient states he was instructed not to take IV contrast.  In 2008 he had an unknown reaction, and was told not to take it again.  He was also told not to take it due to his kidneys.  . Iodine Anxiety, Rash and Other (See Comments)    Didn't feel right "instructed not to take per MD--something with his port"     Past Medical History, Surgical history, Social history, and Family History were reviewed and updated.  Review of Systems: Review of Systems  Constitutional: Positive for malaise/fatigue.  HENT: Negative.   Eyes: Negative.   Respiratory: Negative.   Cardiovascular: Negative.   Gastrointestinal: Negative.   Genitourinary: Negative.   Musculoskeletal: Positive for joint pain.  Skin: Negative.   Neurological: Negative.   Endo/Heme/Allergies: Negative.   Psychiatric/Behavioral: Negative.      Physical Exam:  weight is 206 lb (93.4 kg). His temporal temperature is 98.7 F (37.1 C). His blood pressure is 146/66 (abnormal) and his pulse is 89. His respiration is 19 and oxygen saturation is 96%.   Wt Readings from Last 3 Encounters:  02/27/19 206 lb (93.4 kg)  02/06/19 206 lb (93.4 kg)  01/16/19 204 lb (92.5 kg)    Physical Exam Vitals reviewed.  HENT:     Head: Normocephalic and atraumatic.  Eyes:     Pupils: Pupils are equal, round, and reactive to light.  Cardiovascular:     Rate and  Rhythm: Normal rate and regular rhythm.     Heart sounds: Normal heart sounds.  Pulmonary:     Effort: Pulmonary effort is normal.     Breath sounds: Normal breath sounds.  Abdominal:     General: Bowel sounds are normal.     Palpations: Abdomen is soft.  Musculoskeletal:        General: No tenderness or deformity. Normal range of motion.     Cervical back: Normal range of motion.  Lymphadenopathy:     Cervical: No cervical adenopathy.  Skin:    General: Skin is warm and dry.     Findings: No erythema or rash.  Neurological:     Mental Status: He is alert and oriented to person, place, and time.  Psychiatric:        Behavior: Behavior normal.        Thought Content: Thought content normal.        Judgment: Judgment normal.      Lab Results  Component Value Date   WBC 4.3 02/27/2019   HGB 9.5 (L) 02/27/2019   HCT 29.8 (L) 02/27/2019   MCV 81.0 02/27/2019   PLT 206 02/27/2019   Lab Results  Component Value Date   FERRITIN 1,617 (H) 02/06/2019   IRON 131 02/06/2019   TIBC 259 02/06/2019   UIBC 127 02/06/2019   IRONPCTSAT 51 02/06/2019   Lab Results  Component Value Date   RETICCTPCT 0.6 07/25/2018   RBC 3.68 (L) 02/27/2019   RETICCTABS 27.5 03/21/2014   Lab Results  Component Value Date   KPAFRELGTCHN 38.5 (H) 02/06/2019   LAMBDASER 21.7 02/06/2019   KAPLAMBRATIO 1.77 (H) 02/06/2019   Lab Results  Component Value Date   IGGSERUM 895 02/06/2019   IGA 175 02/06/2019   IGMSERUM 25 02/06/2019   Lab Results  Component Value Date   TOTALPROTELP 6.5 02/06/2019   ALBUMINELP 3.7 02/06/2019   A1GS 0.2 02/06/2019   A2GS 0.9 02/06/2019   BETS 0.9 02/06/2019   BETA2SER 0.5 11/07/2014   GAMS 0.8 02/06/2019   MSPIKE Not Observed 02/06/2019   SPEI Comment 12/26/2018  Chemistry      Component Value Date/Time   NA 143 02/06/2019 0820   NA 143 02/22/2017 0744   K 3.8 02/06/2019 0820   K 4.0 02/22/2017 0744   CL 106 02/06/2019 0820   CL 106 02/22/2017 0744     CO2 28 02/06/2019 0820   CO2 27 02/22/2017 0744   BUN 24 (H) 02/06/2019 0820   BUN 26 (H) 02/22/2017 0744   CREATININE 2.30 (H) 02/06/2019 0820   CREATININE 2.3 (H) 02/22/2017 0744      Component Value Date/Time   CALCIUM 8.9 02/06/2019 0820   CALCIUM 9.3 02/22/2017 0744   ALKPHOS 64 02/06/2019 0820   ALKPHOS 80 02/22/2017 0744   AST 20 02/06/2019 0820   ALT 29 02/06/2019 0820   ALT 22 02/22/2017 0744   BILITOT 0.4 02/06/2019 0820       Impression and Plan: [name] is a very pleasant 69 yo African American gentleman with IgG kappa myeloma.  So far, this has remained stable.  His treatment is only once every 3 weeks.  If he continues to hold steady, we might make his treatments once every 4 weeks.  We will go ahead and give him Aranesp today.  He did receive Testosterone today.   We will have to see what his iron studies look like.  I will plan to get him back in another 3 weeks or so.Marland Kitchen   Volanda Napoleon, MD 1/4/20219:00 AM

## 2019-02-27 NOTE — Patient Instructions (Signed)
Dexamethasone injection What is this medicine? DEXAMETHASONE (dex a METH a sone) is a corticosteroid. It is used to treat inflammation of the skin, joints, lungs, and other organs. Common conditions treated include asthma, allergies, and arthritis. It is also used for other conditions, like blood disorders and diseases of the adrenal glands. This medicine may be used for other purposes; ask your health care provider or pharmacist if you have questions. COMMON BRAND NAME(S): Decadron, DoubleDex, Simplist Dexamethasone, Solurex What should I tell my health care provider before I take this medicine? They need to know if you have any of these conditions:  Cushing's syndrome  diabetes  glaucoma  heart disease  high blood pressure  infection like herpes, measles, tuberculosis, or chickenpox  kidney disease  liver disease  mental illness  myasthenia gravis  osteoporosis  previous heart attack  seizures  stomach or intestine problems  thyroid disease  an unusual or allergic reaction to dexamethasone, corticosteroids, other medicines, lactose, foods, dyes, or preservatives  pregnant or trying to get pregnant  breast-feeding How should I use this medicine? This medicine is for injection into a muscle, joint, lesion, soft tissue, or vein. It is given by a health care professional in a hospital or clinic setting. Talk to your pediatrician regarding the use of this medicine in children. Special care may be needed. Overdosage: If you think you have taken too much of this medicine contact a poison control center or emergency room at once. NOTE: This medicine is only for you. Do not share this medicine with others. What if I miss a dose? This may not apply. If you are having a series of injections over a prolonged period, try not to miss an appointment. Call your doctor or health care professional to reschedule if you are unable to keep an appointment. What may interact with this  medicine? Do not take this medicine with any of the following medications:  live virus vaccines This medicine may also interact with the following medications:  aminoglutethimide  amphotericin B  aspirin and aspirin-like medicines  certain antibiotics like erythromycin, clarithromycin, and troleandomycin  certain antivirals for HIV or hepatitis  certain medicines for seizures like carbamazepine, phenobarbital, phenytoin  certain medicines to treat myasthenia gravis  cholestyramine  cyclosporine  digoxin  diuretics  ephedrine  male hormones, like estrogen or progestins and birth control pills  insulin or other medicines for diabetes  isoniazid  ketoconazole  medicines that relax muscles for surgery  mifepristone  NSAIDs, medicines for pain and inflammation, like ibuprofen or naproxen  rifampin  skin tests for allergies  thalidomide  vaccines  warfarin This list may not describe all possible interactions. Give your health care provider a list of all the medicines, herbs, non-prescription drugs, or dietary supplements you use. Also tell them if you smoke, drink alcohol, or use illegal drugs. Some items may interact with your medicine. What should I watch for while using this medicine? Visit your health care professional for regular checks on your progress. Tell your health care professional if your symptoms do not start to get better or if they get worse. Your condition will be monitored carefully while you are receiving this medicine. Wear a medical ID bracelet or chain. Carry a card that describes your disease and details of your medicine and dosage times. This medicine may increase your risk of getting an infection. Call your health care professional for advice if you get a fever, chills, or sore throat, or other symptoms of a cold or  flu. Do not treat yourself. Try to avoid being around people who are sick. Call your health care professional if you are  around anyone with measles, chickenpox, or if you develop sores or blisters that do not heal properly. If you are going to need surgery or other procedures, tell your doctor or health care professional that you have taken this medicine within the last 12 months. Ask your doctor or health care professional about your diet. You may need to lower the amount of salt you eat. This medicine may increase blood sugar. Ask your healthcare provider if changes in diet or medicines are needed if you have diabetes. What side effects may I notice from receiving this medicine? Side effects that you should report to your doctor or health care professional as soon as possible:  allergic reactions like skin rash, itching or hives, swelling of the face, lips, or tongue  bloody or black, tarry stools  changes in emotions or moods  changes in vision  confusion, excitement, restlessness  depressed mood  eye pain  hallucinations  muscle weakness  severe or sudden stomach or belly pain  signs and symptoms of high blood sugar such as being more thirsty or hungry or having to urinate more than normal. You may also feel very tired or have blurry vision.  signs and symptoms of infection like fever; chills; cough; sore throat; pain or trouble passing urine  swelling of ankles, feet  unusual bruising or bleeding  wounds that do not heal Side effects that usually do not require medical attention (report to your doctor or health care professional if they continue or are bothersome):  increased appetite  increased growth of face or body hair  headache  nausea, vomiting  pain, redness, or irritation at site where injected  skin problems, acne, thin and shiny skin  trouble sleeping  weight gain This list may not describe all possible side effects. Call your doctor for medical advice about side effects. You may report side effects to FDA at 1-800-FDA-1088. Where should I keep my medicine? This  medicine is given in a hospital or clinic and will not be stored at home. NOTE: This sheet is a summary. It may not cover all possible information. If you have questions about this medicine, talk to your doctor, pharmacist, or health care provider.  2020 Else[employer]/Gold Standard (2018-08-23 13:51:58) Palonosetron Injection What is this medicine? PALONOSETRON (pal oh NOE se tron) is used to prevent nausea and vomiting caused by chemotherapy. It also helps prevent delayed nausea and vomiting that may occur a few days after your treatment. This medicine may be used for other purposes; ask your health care provider or pharmacist if you have questions. COMMON BRAND NAME(S): Aloxi What should I tell my health care provider before I take this medicine? They need to know if you have any of these conditions:  an unusual or allergic reaction to palonosetron, dolasetron, granisetron, ondansetron, other medicines, foods, dyes, or preservatives  pregnant or trying to get pregnant  breast-feeding How should I use this medicine? This medicine is for infusion into a vein. It is given by a health care professional in a hospital or clinic setting. Talk to your pediatrician regarding the use of this medicine in children. While this drug may be prescribed for children as young as 1 month for selected conditions, precautions do apply. Overdosage: If you think you have taken too much of this medicine contact a poison control center or emergency room at once. NOTE: This  medicine is only for you. Do not share this medicine with others. What if I miss a dose? This does not apply. What may interact with this medicine?  certain medicines for depression, anxiety, or psychotic disturbances  fentanyl  linezolid  MAOIs like Carbex, Eldepryl, Marplan, Nardil, and Parnate  methylene blue (injected into a vein)  tramadol This list may not describe all possible interactions. Give your health care provider a list of  all the medicines, herbs, non-prescription drugs, or dietary supplements you use. Also tell them if you smoke, drink alcohol, or use illegal drugs. Some items may interact with your medicine. What should I watch for while using this medicine? Your condition will be monitored carefully while you are receiving this medicine. What side effects may I notice from receiving this medicine? Side effects that you should report to your doctor or health care professional as soon as possible:  allergic reactions like skin rash, itching or hives, swelling of the face, lips, or tongue  breathing problems  confusion  dizziness  fast, irregular heartbeat  fever and chills  loss of balance or coordination  seizures  sweating  swelling of the hands and feet  tremors  unusually weak or tired Side effects that usually do not require medical attention (report to your doctor or health care professional if they continue or are bothersome):  constipation or diarrhea  headache This list may not describe all possible side effects. Call your doctor for medical advice about side effects. You may report side effects to FDA at 1-800-FDA-1088. Where should I keep my medicine? This drug is given in a hospital or clinic and will not be stored at home. NOTE: This sheet is a summary. It may not cover all possible information. If you have questions about this medicine, talk to your doctor, pharmacist, or health care provider.  2020 Elsevier/Gold Standard (2012-12-16 10:38:36) Pamidronate injection What is this medicine? PAMIDRONATE (pa mi DROE nate) slows calcium loss from bones. It is used to treat high calcium blood levels from cancer or Paget's disease. It is also used to treat bone pain and prevent fractures from certain cancers that have spread to the bone. This medicine may be used for other purposes; ask your health care provider or pharmacist if you have questions. COMMON BRAND NAME(S): Aredia What  should I tell my health care provider before I take this medicine? They need to know if you have any of these conditions:  aspirin-sensitive asthma  dental disease  kidney disease  an unusual or allergic reaction to pamidronate, other medicines, foods, dyes, or preservatives  pregnant or trying to get pregnant  breast-feeding How should I use this medicine? This medicine is for infusion into a vein. It is given by a health care professional in a hospital or clinic setting. Talk to your pediatrician regarding the use of this medicine in children. This medicine is not approved for use in children. Overdosage: If you think you have taken too much of this medicine contact a poison control center or emergency room at once. NOTE: This medicine is only for you. Do not share this medicine with others. What if I miss a dose? This does not apply. What may interact with this medicine?  certain antibiotics given by injection  medicines for inflammation or pain like ibuprofen, naproxen  some diuretics like bumetanide, furosemide  cyclosporine  parathyroid hormone  tacrolimus  teriparatide  thalidomide This list may not describe all possible interactions. Give your health care provider a list  of all the medicines, herbs, non-prescription drugs, or dietary supplements you use. Also tell them if you smoke, drink alcohol, or use illegal drugs. Some items may interact with your medicine. What should I watch for while using this medicine? Visit your doctor or health care professional for regular checkups. It may be some time before you see the benefit from this medicine. Do not stop taking your medicine unless your doctor tells you to. Your doctor may order blood tests or other tests to see how you are doing. Women should inform their doctor if they wish to become pregnant or think they might be pregnant. There is a potential for serious side effects to an unborn child. Talk to your health care  professional or pharmacist for more information. You should make sure that you get enough calcium and vitamin D while you are taking this medicine. Discuss the foods you eat and the vitamins you take with your health care professional. Some people who take this medicine have severe bone, joint, and/or muscle pain. This medicine may also increase your risk for a broken thigh bone. Tell your doctor right away if you have pain in your upper leg or groin. Tell your doctor if you have any pain that does not go away or that gets worse. What side effects may I notice from receiving this medicine? Side effects that you should report to your doctor or health care professional as soon as possible:  allergic reactions like skin rash, itching or hives, swelling of the face, lips, or tongue  black or tarry stools  changes in vision  eye inflammation, pain  high blood pressure  jaw pain, especially burning or cramping  muscle weakness  numb, tingling pain  swelling of feet or hands  trouble passing urine or change in the amount of urine  unable to move easily Side effects that usually do not require medical attention (report to your doctor or health care professional if they continue or are bothersome):  bone, joint, or muscle pain  constipation  dizzy, drowsy  fever  headache  loss of appetite  nausea, vomiting  pain at site where injected This list may not describe all possible side effects. Call your doctor for medical advice about side effects. You may report side effects to FDA at 1-800-FDA-1088. Where should I keep my medicine? This drug is given in a hospital or clinic and will not be stored at home. NOTE: This sheet is a summary. It may not cover all possible information. If you have questions about this medicine, talk to your doctor, pharmacist, or health care provider.  2020 Elsevier/Gold Standard (2010-08-08 08:49:49) Cyclophosphamide Injection What is this  medicine? CYCLOPHOSPHAMIDE (sye kloe FOSS fa mide) is a chemotherapy drug. It slows the growth of cancer cells. This medicine is used to treat many types of cancer like lymphoma, myeloma, leukemia, breast cancer, and ovarian cancer, to name a few. This medicine may be used for other purposes; ask your health care provider or pharmacist if you have questions. COMMON BRAND NAME(S): Cytoxan, Neosar What should I tell my health care provider before I take this medicine? They need to know if you have any of these conditions:  heart disease  history of irregular heartbeat  infection  kidney disease  liver disease  low blood counts, like white cells, platelets, or red blood cells  on hemodialysis  recent or ongoing radiation therapy  scarring or thickening of the lungs  trouble passing urine  an unusual or allergic  reaction to cyclophosphamide, other medicines, foods, dyes, or preservatives  pregnant or trying to get pregnant  breast-feeding How should I use this medicine? This drug is usually given as an injection into a vein or muscle or by infusion into a vein. It is administered in a hospital or clinic by a specially trained health care professional. Talk to your pediatrician regarding the use of this medicine in children. Special care may be needed. Overdosage: If you think you have taken too much of this medicine contact a poison control center or emergency room at once. NOTE: This medicine is only for you. Do not share this medicine with others. What if I miss a dose? It is important not to miss your dose. Call your doctor or health care professional if you are unable to keep an appointment. What may interact with this medicine?  amphotericin B  azathioprine  certain antivirals for HIV or hepatitis  certain medicines for blood pressure, heart disease, irregular heart beat  certain medicines that treat or prevent blood clots like warfarin  certain other medicines for  cancer  cyclosporine  etanercept  indomethacin  medicines that relax muscles for surgery  medicines to increase blood counts  metronidazole This list may not describe all possible interactions. Give your health care provider a list of all the medicines, herbs, non-prescription drugs, or dietary supplements you use. Also tell them if you smoke, drink alcohol, or use illegal drugs. Some items may interact with your medicine. What should I watch for while using this medicine? Your condition will be monitored carefully while you are receiving this medicine. You may need blood work done while you are taking this medicine. Drink water or other fluids as directed. Urinate often, even at night. Some products may contain alcohol. Ask your health care professional if this medicine contains alcohol. Be sure to tell all health care professionals you are taking this medicine. Certain medicines, like metronidazole and disulfiram, can cause an unpleasant reaction when taken with alcohol. The reaction includes flushing, headache, nausea, vomiting, sweating, and increased thirst. The reaction can last from 30 minutes to several hours. Do not become pregnant while taking this medicine or for 1 year after stopping it. Women should inform their health care professional if they wish to become pregnant or think they might be pregnant. Men should not father a child while taking this medicine and for 4 months after stopping it. There is potential for serious side effects to an unborn child. Talk to your health care professional for more information. Do not breast-feed an infant while taking this medicine or for 1 week after stopping it. This medicine has caused ovarian failure in some women. This medicine may make it more difficult to get pregnant. Talk to your health care professional if you are concerned about your fertility. This medicine has caused decreased sperm counts in some men. This may make it more  difficult to father a child. Talk to your health care professional if you are concerned about your fertility. Call your health care professional for advice if you get a fever, chills, or sore throat, or other symptoms of a cold or flu. Do not treat yourself. This medicine decreases your body's ability to fight infections. Try to avoid being around people who are sick. Avoid taking medicines that contain aspirin, acetaminophen, ibuprofen, naproxen, or ketoprofen unless instructed by your health care professional. These medicines may hide a fever. Talk to your health care professional about your risk of cancer. You may be  more at risk for certain types of cancer if you take this medicine. If you are going to need surgery or other procedure, tell your health care professional that you are using this medicine. Be careful brushing or flossing your teeth or using a toothpick because you may get an infection or bleed more easily. If you have any dental work done, tell your dentist you are receiving this medicine. What side effects may I notice from receiving this medicine? Side effects that you should report to your doctor or health care professional as soon as possible:  allergic reactions like skin rash, itching or hives, swelling of the face, lips, or tongue  breathing problems  nausea, vomiting  signs and symptoms of bleeding such as bloody or black, tarry stools; red or dark brown urine; spitting up blood or brown material that looks like coffee grounds; red spots on the skin; unusual bruising or bleeding from the eyes, gums, or nose  signs and symptoms of heart failure like fast, irregular heartbeat, sudden weight gain; swelling of the ankles, feet, hands  signs and symptoms of infection like fever; chills; cough; sore throat; pain or trouble passing urine  signs and symptoms of kidney injury like trouble passing urine or change in the amount of urine  signs and symptoms of liver injury like  dark yellow or brown urine; general ill feeling or flu-like symptoms; light-colored stools; loss of appetite; nausea; right upper belly pain; unusually weak or tired; yellowing of the eyes or skin Side effects that usually do not require medical attention (report to your doctor or health care professional if they continue or are bothersome):  confusion  decreased hearing  diarrhea  facial flushing  hair loss  headache  loss of appetite  missed menstrual periods  signs and symptoms of low red blood cells or anemia such as unusually weak or tired; feeling faint or lightheaded; falls  skin discoloration This list may not describe all possible side effects. Call your doctor for medical advice about side effects. You may report side effects to FDA at 1-800-FDA-1088. Where should I keep my medicine? This drug is given in a hospital or clinic and will not be stored at home. NOTE: This sheet is a summary. It may not cover all possible information. If you have questions about this medicine, talk to your doctor, pharmacist, or health care provider.  2020 Elsevier/Gold Standard (2018-11-14 09:53:29) Carfilzomib injection What is this medicine? CARFILZOMIB (kar FILZ oh mib) targets a specific protein within cancer cells and stops the cancer cells from growing. It is used to treat multiple myeloma. This medicine may be used for other purposes; ask your health care provider or pharmacist if you have questions. COMMON BRAND NAME(S): KYPROLIS What should I tell my health care provider before I take this medicine? They need to know if you have any of these conditions:  heart disease  history of blood clots  irregular heartbeat  kidney disease  liver disease  lung or breathing disease  an unusual or allergic reaction to carfilzomib, or other medicines, foods, dyes, or preservatives  pregnant or trying to get pregnant  breast-feeding How should I use this medicine? This medicine is  for injection or infusion into a vein. It is given by a health care professional in a hospital or clinic setting. Talk to your pediatrician regarding the use of this medicine in children. Special care may be needed. Overdosage: If you think you have taken too much of this medicine contact a  poison control center or emergency room at once. NOTE: This medicine is only for you. Do not share this medicine with others. What if I miss a dose? It is important not to miss your dose. Call your doctor or health care professional if you are unable to keep an appointment. What may interact with this medicine? Interactions are not expected. Give your health care provider a list of all the medicines, herbs, non-prescription drugs, or dietary supplements you use. Also tell them if you smoke, drink alcohol, or use illegal drugs. Some items may interact with your medicine. This list may not describe all possible interactions. Give your health care provider a list of all the medicines, herbs, non-prescription drugs, or dietary supplements you use. Also tell them if you smoke, drink alcohol, or use illegal drugs. Some items may interact with your medicine. What should I watch for while using this medicine? Your condition will be monitored carefully while you are receiving this medicine. Report any side effects. Continue your course of treatment even though you feel ill unless your doctor tells you to stop. You may need blood work done while you are taking this medicine. Do not become pregnant while taking this medicine or for at least 6 months after stopping it. Women should inform their doctor if they wish to become pregnant or think they might be pregnant. There is a potential for serious side effects to an unborn child. Men should not father a child while taking this medicine and for at least 3 months after stopping it. Talk to your health care professional or pharmacist for more information. Do not breast-feed an  infant while taking this medicine or for 2 weeks after the last dose. Check with your doctor or health care professional if you get an attack of severe diarrhea, nausea and vomiting, or if you sweat a lot. The loss of too much body fluid can make it dangerous for you to take this medicine. You may get dizzy. Do not drive, use machinery, or do anything that needs mental alertness until you know how this medicine affects you. Do not stand or sit up quickly, especially if you are an older patient. This reduces the risk of dizzy or fainting spells. What side effects may I notice from receiving this medicine? Side effects that you should report to your doctor or health care professional as soon as possible:  allergic reactions like skin rash, itching or hives, swelling of the face, lips, or tongue  confusion  dizziness  feeling faint or lightheaded  fever or chills  palpitations  seizures  signs and symptoms of bleeding such as bloody or black, tarry stools; red or dark-brown urine; spitting up blood or brown material that looks like coffee grounds; red spots on the skin; unusual bruising or bleeding including from the eye, gums, or nose  signs and symptoms of a blood clot such as breathing problems; changes in vision; chest pain; severe, sudden headache; pain, swelling, warmth in the leg; trouble speaking; sudden numbness or weakness of the face, arm or leg  signs and symptoms of kidney injury like trouble passing urine or change in the amount of urine  signs and symptoms of liver injury like dark yellow or brown urine; general ill feeling or flu-like symptoms; light-colored stools; loss of appetite; nausea; right upper belly pain; unusually weak or tired; yellowing of the eyes or skin Side effects that usually do not require medical attention (report to your doctor or health care professional if they  continue or are bothersome):  back pain  cough  diarrhea  headache  muscle  cramps  trouble sleeping  vomiting This list may not describe all possible side effects. Call your doctor for medical advice about side effects. You may report side effects to FDA at 1-800-FDA-1088. Where should I keep my medicine? This drug is given in a hospital or clinic and will not be stored at home. NOTE: This sheet is a summary. It may not cover all possible information. If you have questions about this medicine, talk to your doctor, pharmacist, or health care provider.  2020 Elsevier/Gold Standard (2018-10-17 19:44:21)

## 2019-02-27 NOTE — Patient Instructions (Signed)

## 2019-02-27 NOTE — Telephone Encounter (Signed)
Appointments scheduled calendar printed per 1/4 los 

## 2019-03-20 ENCOUNTER — Encounter: Payer: Self-pay | Admitting: Family

## 2019-03-20 ENCOUNTER — Inpatient Hospital Stay: Payer: Medicare Other

## 2019-03-20 ENCOUNTER — Other Ambulatory Visit: Payer: Self-pay

## 2019-03-20 ENCOUNTER — Inpatient Hospital Stay (HOSPITAL_BASED_OUTPATIENT_CLINIC_OR_DEPARTMENT_OTHER): Payer: Medicare Other | Admitting: Family

## 2019-03-20 ENCOUNTER — Telehealth: Payer: Self-pay | Admitting: Hematology & Oncology

## 2019-03-20 VITALS — BP 141/76 | HR 84 | Temp 99.6°F | Resp 18 | Ht 71.0 in | Wt 205.0 lb

## 2019-03-20 DIAGNOSIS — C9000 Multiple myeloma not having achieved remission: Secondary | ICD-10-CM

## 2019-03-20 DIAGNOSIS — N189 Chronic kidney disease, unspecified: Secondary | ICD-10-CM

## 2019-03-20 DIAGNOSIS — D508 Other iron deficiency anemias: Secondary | ICD-10-CM | POA: Diagnosis not present

## 2019-03-20 DIAGNOSIS — Z5112 Encounter for antineoplastic immunotherapy: Secondary | ICD-10-CM | POA: Diagnosis not present

## 2019-03-20 DIAGNOSIS — E349 Endocrine disorder, unspecified: Secondary | ICD-10-CM

## 2019-03-20 DIAGNOSIS — D631 Anemia in chronic kidney disease: Secondary | ICD-10-CM | POA: Diagnosis not present

## 2019-03-20 DIAGNOSIS — Z299 Encounter for prophylactic measures, unspecified: Secondary | ICD-10-CM

## 2019-03-20 DIAGNOSIS — M4712 Other spondylosis with myelopathy, cervical region: Secondary | ICD-10-CM

## 2019-03-20 LAB — IRON AND TIBC
Iron: 87 ug/dL (ref 42–163)
Saturation Ratios: 36 % (ref 20–55)
TIBC: 240 ug/dL (ref 202–409)
UIBC: 153 ug/dL (ref 117–376)

## 2019-03-20 LAB — CBC WITH DIFFERENTIAL (CANCER CENTER ONLY)
Abs Immature Granulocytes: 0.03 10*3/uL (ref 0.00–0.07)
Basophils Absolute: 0 10*3/uL (ref 0.0–0.1)
Basophils Relative: 1 %
Eosinophils Absolute: 0.2 10*3/uL (ref 0.0–0.5)
Eosinophils Relative: 5 %
HCT: 27.8 % — ABNORMAL LOW (ref 39.0–52.0)
Hemoglobin: 8.9 g/dL — ABNORMAL LOW (ref 13.0–17.0)
Immature Granulocytes: 1 %
Lymphocytes Relative: 18 %
Lymphs Abs: 0.8 10*3/uL (ref 0.7–4.0)
MCH: 25.8 pg — ABNORMAL LOW (ref 26.0–34.0)
MCHC: 32 g/dL (ref 30.0–36.0)
MCV: 80.6 fL (ref 80.0–100.0)
Monocytes Absolute: 0.5 10*3/uL (ref 0.1–1.0)
Monocytes Relative: 11 %
Neutro Abs: 2.8 10*3/uL (ref 1.7–7.7)
Neutrophils Relative %: 64 %
Platelet Count: 177 10*3/uL (ref 150–400)
RBC: 3.45 MIL/uL — ABNORMAL LOW (ref 4.22–5.81)
RDW: 18.6 % — ABNORMAL HIGH (ref 11.5–15.5)
WBC Count: 4.3 10*3/uL (ref 4.0–10.5)
nRBC: 0 % (ref 0.0–0.2)

## 2019-03-20 LAB — CMP (CANCER CENTER ONLY)
ALT: 22 U/L (ref 0–44)
AST: 24 U/L (ref 15–41)
Albumin: 4.2 g/dL (ref 3.5–5.0)
Alkaline Phosphatase: 66 U/L (ref 38–126)
Anion gap: 9 (ref 5–15)
BUN: 18 mg/dL (ref 8–23)
CO2: 30 mmol/L (ref 22–32)
Calcium: 9.5 mg/dL (ref 8.9–10.3)
Chloride: 103 mmol/L (ref 98–111)
Creatinine: 2.46 mg/dL — ABNORMAL HIGH (ref 0.61–1.24)
GFR, Est AFR Am: 30 mL/min — ABNORMAL LOW (ref >60)
GFR, Estimated: 26 mL/min — ABNORMAL LOW (ref >60)
Glucose, Bld: 102 mg/dL — ABNORMAL HIGH (ref 70–99)
Potassium: 3.8 mmol/L (ref 3.5–5.1)
Sodium: 142 mmol/L (ref 135–145)
Total Bilirubin: 0.4 mg/dL (ref 0.3–1.2)
Total Protein: 7 g/dL (ref 6.5–8.1)

## 2019-03-20 LAB — RETICULOCYTES
Immature Retic Fract: 18.2 % — ABNORMAL HIGH (ref 2.3–15.9)
RBC.: 3.46 MIL/uL — ABNORMAL LOW (ref 4.22–5.81)
Retic Count, Absolute: 31.1 10*3/uL (ref 19.0–186.0)
Retic Ct Pct: 0.9 % (ref 0.4–3.1)

## 2019-03-20 LAB — FERRITIN: Ferritin: 2382 ng/mL — ABNORMAL HIGH (ref 24–336)

## 2019-03-20 MED ORDER — DEXAMETHASONE SODIUM PHOSPHATE 10 MG/ML IJ SOLN
INTRAMUSCULAR | Status: AC
  Filled 2019-03-20: qty 1

## 2019-03-20 MED ORDER — PALONOSETRON HCL INJECTION 0.25 MG/5ML
0.2500 mg | Freq: Once | INTRAVENOUS | Status: AC
  Administered 2019-03-20: 0.25 mg via INTRAVENOUS

## 2019-03-20 MED ORDER — DEXAMETHASONE SODIUM PHOSPHATE 10 MG/ML IJ SOLN
10.0000 mg | Freq: Once | INTRAMUSCULAR | Status: AC
  Administered 2019-03-20: 10 mg via INTRAVENOUS

## 2019-03-20 MED ORDER — SODIUM CHLORIDE 0.9 % IV SOLN
300.0000 mg/m2 | Freq: Once | INTRAVENOUS | Status: AC
  Administered 2019-03-20: 11:00:00 620 mg via INTRAVENOUS
  Filled 2019-03-20: qty 31

## 2019-03-20 MED ORDER — HYDROMORPHONE HCL 1 MG/ML IJ SOLN
INTRAMUSCULAR | Status: AC
  Filled 2019-03-20: qty 2

## 2019-03-20 MED ORDER — TESTOSTERONE CYPIONATE 200 MG/ML IM SOLN
INTRAMUSCULAR | Status: AC
  Filled 2019-03-20: qty 2

## 2019-03-20 MED ORDER — PALONOSETRON HCL INJECTION 0.25 MG/5ML
INTRAVENOUS | Status: AC
  Filled 2019-03-20: qty 5

## 2019-03-20 MED ORDER — DARBEPOETIN ALFA 300 MCG/0.6ML IJ SOSY
PREFILLED_SYRINGE | INTRAMUSCULAR | Status: AC
  Filled 2019-03-20: qty 0.6

## 2019-03-20 MED ORDER — DARBEPOETIN ALFA 300 MCG/0.6ML IJ SOSY
300.0000 ug | PREFILLED_SYRINGE | Freq: Once | INTRAMUSCULAR | Status: AC
  Administered 2019-03-20: 12:00:00 300 ug via SUBCUTANEOUS

## 2019-03-20 MED ORDER — HYDROMORPHONE HCL 1 MG/ML IJ SOLN
2.0000 mg | Freq: Once | INTRAMUSCULAR | Status: AC
  Administered 2019-03-20: 10:00:00 2 mg via INTRAVENOUS

## 2019-03-20 MED ORDER — TESTOSTERONE CYPIONATE 200 MG/ML IM SOLN
400.0000 mg | Freq: Once | INTRAMUSCULAR | Status: AC
  Administered 2019-03-20: 400 mg via INTRAMUSCULAR

## 2019-03-20 MED ORDER — SODIUM CHLORIDE 0.9 % IV SOLN
Freq: Once | INTRAVENOUS | Status: AC
  Filled 2019-03-20: qty 250

## 2019-03-20 MED ORDER — HEPARIN SOD (PORK) LOCK FLUSH 100 UNIT/ML IV SOLN
500.0000 [IU] | Freq: Once | INTRAVENOUS | Status: AC | PRN
  Administered 2019-03-20: 500 [IU]
  Filled 2019-03-20: qty 5

## 2019-03-20 MED ORDER — DEXTROSE 5 % IV SOLN
29.0000 mg/m2 | Freq: Once | INTRAVENOUS | Status: AC
  Administered 2019-03-20: 60 mg via INTRAVENOUS
  Filled 2019-03-20: qty 30

## 2019-03-20 MED ORDER — SODIUM CHLORIDE 0.9% FLUSH
10.0000 mL | INTRAVENOUS | Status: DC | PRN
  Administered 2019-03-20: 10 mL
  Filled 2019-03-20: qty 10

## 2019-03-20 NOTE — Progress Notes (Signed)
Hematology and Oncology Follow Up Visit  Jordani Nunn [id number] [birth year] 69 y.o. 03/20/2019   Principle Diagnosis:  IgG kappa myeloma  Anemia secondary to renal insufficiency  Intermittent iron - deficiency anemia  Hypotestosteronemia  Current Therapy:   Aredia 60 mg IV q 3 months-- next dose04/2021 Aranesp 300 mcg subcu as needed for hemoglobin less than 11 DepoTestosterone400 mg q4weeks IV iron as indicated Kyprolis/Cytoxan - s/p cycle23   Interim History:  Mr. Ardoin is here today for follow-up and treatment. He is feeling fatigued and has generalized aches and pains that wax and wane from arthritis.  December, no M-spike noted, IgG level 895 and kappa light chains 3.85 mg/dL. He has had some urinary frequency and urgency and has an appointment with his urologist later this week.  No fever, chills, n/v, cough, rash, dizziness, SOB, chest pain, palpitations, abdominal pain or changes in bowel or bladder habits.  He has chronic constipation and takes Lactulose as needed which he states is effective. No episodes of bleeding. No bruising or petechiae.   No swelling in his extremities.  The neuropathy in his hands and feet is unchanged.  No falls or syncopal episodes to report.  His appetite comes and goes. He does feel that he hydrates adequately. His weight is stable.   ECOG Performance Status: 1 - Symptomatic but completely ambulatory  Medications:  Allergies as of 03/20/2019      Reactions   Iodinated Diagnostic Agents Rash   Patient states he was instructed not to take IV contrast.  In 2008 he had an unknown reaction, and was told not to take it again.  He was also told not to take it due to his kidneys.   Iodine Anxiety, Rash, Other (See Comments)   Didn't feel right "instructed not to take per MD--something with his port"      Medication List       Accurate as of March 20, 2019  9:15 AM. If you have any questions, ask your nurse or doctor.          amLODipine 5 MG tablet Commonly known as: NORVASC Take 1 tablet by mouth daily. What changed: Another medication with the same name was removed. Continue taking this medication, and follow the directions you see here. Changed by: Laverna Peace, NP   atorvastatin 40 MG tablet Commonly known as: LIPITOR daily.   Carafate 1 GM/10ML suspension Generic drug: sucralfate Take 1 g by mouth 4 (four) times daily.   Clear Eyes All Seasons 5-6 MG/ML Soln Generic drug: Polyvinyl Alcohol-Povidone Place 2 drops into both eyes 3 (three) times daily as needed (drye eyes.).   cyclobenzaprine 10 MG tablet Commonly known as: FLEXERIL Take 10 mg by mouth 3 (three) times daily.   dicyclomine 10 MG capsule Commonly known as: BENTYL TAKE 1 CAPSULE BY MOUTH THREE TIMES A DAY   dronabinol 2.5 MG capsule Commonly known as: MARINOL Take 1 capsule (2.5 mg total) by mouth 2 (two) times daily before a meal.   Embeda 30-1.2 MG Cpcr Generic drug: Morphine-Naltrexone Take 3 (three) times daily by mouth.   Embeda 20-0.8 MG Cpcr Generic drug: Morphine-Naltrexone Take 1 capsule by mouth 3 (three) times daily.   furosemide 40 MG tablet Commonly known as: LASIX Take 40 mg by mouth daily.   gabapentin 300 MG capsule Commonly known as: NEURONTIN Take one capsule by mouth three times a day.   glimepiride 2 MG tablet Commonly known as: AMARYL Take 2 mg by mouth daily.  glipiZIDE 5 MG tablet Commonly known as: GLUCOTROL Take 5 mg by mouth 2 (two) times daily before a meal.   lactulose 10 GM/15ML solution Commonly known as: CHRONULAC Take 30 mLs (20 g total) by mouth daily.   latanoprost 0.005 % ophthalmic solution Commonly known as: XALATAN Place 1 drop into the right eye every evening.   lidocaine-prilocaine cream Commonly known as: EMLA Apply 1 application topically as needed.   linaclotide 145 MCG Caps capsule Commonly known as: LINZESS Take 1 capsule (145 mcg total) by mouth daily  before breakfast. What changed:   when to take this  reasons to take this   LORazepam 0.5 MG tablet Commonly known as: Ativan Take 1 tablet (0.5 mg total) by mouth every 6 (six) hours as needed (Nausea or vomiting).   losartan 50 MG tablet Commonly known as: COZAAR Take 50 mg by mouth daily.   mesalamine 1.2 g EC tablet Commonly known as: Lialda Take 1 tablet (1.2 g total) by mouth daily with breakfast.   metFORMIN 500 MG tablet Commonly known as: GLUCOPHAGE Take 500 mg by mouth 2 (two) times daily.   metoprolol succinate 100 MG 24 hr tablet Commonly known as: TOPROL-XL Take 100 mg by mouth daily.   mirabegron ER 50 MG Tb24 tablet Commonly known as: MYRBETRIQ Take 50 mg by mouth daily.   morphine 30 MG 12 hr tablet Commonly known as: MS CONTIN Take 30 mg by mouth 2 (two) times daily.   omeprazole 40 MG capsule Commonly known as: PRILOSEC Take 1 capsule (40 mg total) by mouth 2 (two) times daily.   ondansetron 8 MG tablet Commonly known as: ZOFRAN TAKE 1 TABLET(8 MG) BY MOUTH TWICE DAILY AS NEEDED FOR NAUSEA OR VOMITING   orphenadrine 100 MG tablet Commonly known as: NORFLEX Take 1 tablet (100 mg total) by mouth 2 (two) times daily.   oxyCODONE-acetaminophen 10-325 MG tablet Commonly known as: PERCOCET Take 1 tablet by mouth every 6 (six) hours as needed.   pioglitazone 30 MG tablet Commonly known as: ACTOS Take 30 mg by mouth daily.   potassium chloride SA 20 MEQ tablet Commonly known as: KLOR-CON Take 1 tablet (20 mEq total) by mouth 2 (two) times daily.   prochlorperazine 10 MG tablet Commonly known as: COMPAZINE take 1 tablet by mouth every 6 hours if needed for nausea and vomiting   Relistor 150 MG Tabs Generic drug: Methylnaltrexone Bromide Take 150 mg by mouth.   Sennosides 25 MG Tabs Take 25 mg by mouth daily as needed (constipation).   topiramate 25 MG tablet Commonly known as: TOPAMAX Take 25 mg by mouth daily.   torsemide 20 MG  tablet Commonly known as: DEMADEX Take 20 mg by mouth. Take two tablets total of 40 mg by mouth daily.   Vitamin D (Ergocalciferol) 1.25 MG (50000 UNIT) Caps capsule Commonly known as: DRISDOL Take 50,000 Units by mouth every Saturday.   Xtampza ER 13.5 MG C12a Generic drug: oxyCODONE ER Take 1 capsule by mouth 2 (two) times daily.   Zioptan 0.0015 % Soln Generic drug: Tafluprost (PF) Place 1 drop into both eyes daily.       Allergies:  Allergies  Allergen Reactions  . Iodinated Diagnostic Agents Rash    Patient states he was instructed not to take IV contrast.  In 2008 he had an unknown reaction, and was told not to take it again.  He was also told not to take it due to his kidneys.  . Iodine Anxiety,  Rash and Other (See Comments)    Didn't feel right "instructed not to take per MD--something with his port"     Past Medical History, Surgical history, Social history, and Family History were reviewed and updated.  Review of Systems: All other 10 point review of systems is negative.   Physical Exam:  height is 5\' 11"  (1.803 m) and weight is 205 lb (93 kg). His temporal temperature is 99.6 F (37.6 C). His blood pressure is 141/76 (abnormal) and his pulse is 84. His respiration is 18 and oxygen saturation is 100%.   Wt Readings from Last 3 Encounters:  03/20/19 205 lb (93 kg)  03/20/19 205 lb (93 kg)  02/27/19 206 lb (93.4 kg)    Ocular: Sclerae unicteric, pupils equal, round and reactive to light Ear-nose-throat: Oropharynx clear, dentition fair Lymphatic: No cervical or supraclavicular adenopathy Lungs no rales or rhonchi, good excursion bilaterally Heart regular rate and rhythm, no murmur appreciated Abd soft, nontender, positive bowel sounds, no liver or spleen tip palpated on exam, no fluid wave  MSK no focal spinal tenderness, no joint edema Neuro: non-focal, well-oriented, appropriate affect Breasts: Deferred   Lab Results  Component Value Date   WBC 4.3  03/20/2019   HGB 8.9 (L) 03/20/2019   HCT 27.8 (L) 03/20/2019   MCV 80.6 03/20/2019   PLT 177 03/20/2019   Lab Results  Component Value Date   FERRITIN 1,617 (H) 02/06/2019   IRON 131 02/06/2019   TIBC 259 02/06/2019   UIBC 127 02/06/2019   IRONPCTSAT 51 02/06/2019   Lab Results  Component Value Date   RETICCTPCT 0.9 03/20/2019   RBC 3.46 (L) 03/20/2019   RETICCTABS 27.5 03/21/2014   Lab Results  Component Value Date   KPAFRELGTCHN 38.5 (H) 02/06/2019   LAMBDASER 21.7 02/06/2019   KAPLAMBRATIO 1.77 (H) 02/06/2019   Lab Results  Component Value Date   IGGSERUM 895 02/06/2019   IGA 175 02/06/2019   IGMSERUM 25 02/06/2019   Lab Results  Component Value Date   TOTALPROTELP 6.5 02/06/2019   ALBUMINELP 3.7 02/06/2019   A1GS 0.2 02/06/2019   A2GS 0.9 02/06/2019   BETS 0.9 02/06/2019   BETA2SER 0.5 11/07/2014   GAMS 0.8 02/06/2019   MSPIKE Not Observed 02/06/2019   SPEI Comment 12/26/2018     Chemistry      Component Value Date/Time   NA 142 03/20/2019 0830   NA 143 02/22/2017 0744   K 3.8 03/20/2019 0830   K 4.0 02/22/2017 0744   CL 103 03/20/2019 0830   CL 106 02/22/2017 0744   CO2 30 03/20/2019 0830   CO2 27 02/22/2017 0744   BUN 18 03/20/2019 0830   BUN 26 (H) 02/22/2017 0744   CREATININE 2.46 (H) 03/20/2019 0830   CREATININE 2.3 (H) 02/22/2017 0744      Component Value Date/Time   CALCIUM 9.5 03/20/2019 0830   CALCIUM 9.3 02/22/2017 0744   ALKPHOS 66 03/20/2019 0830   ALKPHOS 80 02/22/2017 0744   AST 24 03/20/2019 0830   ALT 22 03/20/2019 0830   ALT 22 02/22/2017 0744   BILITOT 0.4 03/20/2019 0830       Impression and Plan: Mr. Pressley is a very pleasant 69 yo African American gentleman with IgG kappa myeloma. His counts have remained stable.  We will proceed with treatment today as planned.  He will also get Aranesp and testosterone injections today We will see him back in another 3 weeks.  He will contact our office with  any questions or  concerns. We can certainly see him sooner if needed.   Laverna Peace, NP 1/25/20219:15 AM

## 2019-03-20 NOTE — Progress Notes (Signed)
Ok to treat with SCr of 2.6 per Dr. [name]

## 2019-03-20 NOTE — Patient Instructions (Signed)
Implanted Port Insertion, Care After °This sheet gives you information about how to care for yourself after your procedure. Your health care provider may also give you more specific instructions. If you have problems or questions, contact your health care provider. °What can I expect after the procedure? °After the procedure, it is common to have: °· Discomfort at the port insertion site. °· Bruising on the skin over the port. This should improve over 3-4 days. °Follow these instructions at home: °Port care °· After your port is placed, you will get a manufacturer's information card. The card has information about your port. Keep this card with you at all times. °· Take care of the port as told by your health care provider. Ask your health care provider if you or a family member can get training for taking care of the port at home. A home health care nurse may also take care of the port. °· Make sure to remember what type of port you have. °Incision care ° °  ° °· Follow instructions from your health care provider about how to take care of your port insertion site. Make sure you: °? Wash your hands with soap and water before and after you change your bandage (dressing). If soap and water are not available, use hand sanitizer. °? Change your dressing as told by your health care provider. °? Leave stitches (sutures), skin glue, or adhesive strips in place. These skin closures may need to stay in place for 2 weeks or longer. If adhesive strip edges start to loosen and curl up, you may trim the loose edges. Do not remove adhesive strips completely unless your health care provider tells you to do that. °· Check your port insertion site every day for signs of infection. Check for: °? Redness, swelling, or pain. °? Fluid or blood. °? Warmth. °? Pus or a bad smell. °Activity °· Return to your normal activities as told by your health care provider. Ask your health care provider what activities are safe for you. °· Do not  lift anything that is heavier than 10 lb (4.5 kg), or the limit that you are told, until your health care provider says that it is safe. °General instructions °· Take over-the-counter and prescription medicines only as told by your health care provider. °· Do not take baths, swim, or use a hot tub until your health care provider approves. Ask your health care provider if you may take showers. You may only be allowed to take sponge baths. °· Do not drive for 24 hours if you were given a sedative during your procedure. °· Wear a medical alert bracelet in case of an emergency. This will tell any health care providers that you have a port. °· Keep all follow-up visits as told by your health care provider. This is important. °Contact a health care provider if: °· You cannot flush your port with saline as directed, or you cannot draw blood from the port. °· You have a fever or chills. °· You have redness, swelling, or pain around your port insertion site. °· You have fluid or blood coming from your port insertion site. °· Your port insertion site feels warm to the touch. °· You have pus or a bad smell coming from the port insertion site. °Get help right away if: °· You have chest pain or shortness of breath. °· You have bleeding from your port that you cannot control. °Summary °· Take care of the port as told by your health   care provider. Keep the manufacturer's information card with you at all times. °· Change your dressing as told by your health care provider. °· Contact a health care provider if you have a fever or chills or if you have redness, swelling, or pain around your port insertion site. °· Keep all follow-up visits as told by your health care provider. °This information is not intended to replace advice given to you by your health care provider. Make sure you discuss any questions you have with your health care provider. °Document Revised: 09/07/2017 Document Reviewed: 09/07/2017 °Elsevier Patient Education ©  2020 [employer] ° °

## 2019-03-20 NOTE — Patient Instructions (Signed)
[facility] Discharge Instructions for Patients Receiving Chemotherapy  Today you received the following chemotherapy agents Cytoxan, Kyprolis  To help prevent nausea and vomiting after your treatment, we encourage you to take your nausea medication    If you develop nausea and vomiting that is not controlled by your nausea medication, call the clinic.   BELOW ARE SYMPTOMS THAT SHOULD BE REPORTED IMMEDIATELY:  *FEVER GREATER THAN 100.5 F  *CHILLS WITH OR WITHOUT FEVER  NAUSEA AND VOMITING THAT IS NOT CONTROLLED WITH YOUR NAUSEA MEDICATION  *UNUSUAL SHORTNESS OF BREATH  *UNUSUAL BRUISING OR BLEEDING  TENDERNESS IN MOUTH AND THROAT WITH OR WITHOUT PRESENCE OF ULCERS  *URINARY PROBLEMS  *BOWEL PROBLEMS  UNUSUAL RASH Items with * indicate a potential emergency and should be followed up as soon as possible.  Feel free to call the clinic should you have any questions or concerns. The clinic phone number is [phone].  Please show the CHEMO ALERT CARD at check-in to the Emergency Department and triage nurse.   

## 2019-03-20 NOTE — Telephone Encounter (Signed)
Appointments were previously scheduled at last visit/  Per 1/25 los

## 2019-03-21 LAB — IGG, IGA, IGM
IgA: 158 mg/dL (ref 61–437)
IgG (Immunoglobin G), Serum: 894 mg/dL (ref 603–1613)
IgM (Immunoglobulin M), Srm: 27 mg/dL (ref 20–172)

## 2019-03-21 LAB — TESTOSTERONE: Testosterone: 494 ng/dL (ref 264–916)

## 2019-03-21 LAB — KAPPA/LAMBDA LIGHT CHAINS
Kappa free light chain: 39.4 mg/L — ABNORMAL HIGH (ref 3.3–19.4)
Kappa, lambda light chain ratio: 1.53 (ref 0.26–1.65)
Lambda free light chains: 25.8 mg/L (ref 5.7–26.3)

## 2019-03-23 LAB — PROTEIN ELECTROPHORESIS, SERUM, WITH REFLEX
A/G Ratio: 1.3 (ref 0.7–1.7)
Albumin ELP: 3.9 g/dL (ref 2.9–4.4)
Alpha-1-Globulin: 0.2 g/dL (ref 0.0–0.4)
Alpha-2-Globulin: 1 g/dL (ref 0.4–1.0)
Beta Globulin: 0.9 g/dL (ref 0.7–1.3)
Gamma Globulin: 0.7 g/dL (ref 0.4–1.8)
Globulin, Total: 2.9 g/dL (ref 2.2–3.9)
SPEP Interpretation: 0
Total Protein ELP: 6.8 g/dL (ref 6.0–8.5)

## 2019-03-23 LAB — IMMUNOFIXATION REFLEX, SERUM
IgA: 185 mg/dL (ref 61–437)
IgG (Immunoglobin G), Serum: 1090 mg/dL (ref 603–1613)
IgM (Immunoglobulin M), Srm: 29 mg/dL (ref 20–172)

## 2019-04-03 ENCOUNTER — Emergency Department (HOSPITAL_BASED_OUTPATIENT_CLINIC_OR_DEPARTMENT_OTHER)
Admission: EM | Admit: 2019-04-03 | Discharge: 2019-04-03 | Disposition: A | Discharge status: Home / Self Care | Payer: Medicare Other | Attending: Emergency Medicine | Admitting: Emergency Medicine

## 2019-04-03 ENCOUNTER — Other Ambulatory Visit: Payer: Self-pay

## 2019-04-03 ENCOUNTER — Encounter (HOSPITAL_BASED_OUTPATIENT_CLINIC_OR_DEPARTMENT_OTHER): Payer: Self-pay | Admitting: Emergency Medicine

## 2019-04-03 ENCOUNTER — Emergency Department (HOSPITAL_BASED_OUTPATIENT_CLINIC_OR_DEPARTMENT_OTHER): Payer: Medicare Other

## 2019-04-03 DIAGNOSIS — N189 Chronic kidney disease, unspecified: Secondary | ICD-10-CM | POA: Diagnosis not present

## 2019-04-03 DIAGNOSIS — E785 Hyperlipidemia, unspecified: Secondary | ICD-10-CM | POA: Insufficient documentation

## 2019-04-03 DIAGNOSIS — E1122 Type 2 diabetes mellitus with diabetic chronic kidney disease: Secondary | ICD-10-CM | POA: Diagnosis not present

## 2019-04-03 DIAGNOSIS — M797 Fibromyalgia: Secondary | ICD-10-CM | POA: Diagnosis not present

## 2019-04-03 DIAGNOSIS — I129 Hypertensive chronic kidney disease with stage 1 through stage 4 chronic kidney disease, or unspecified chronic kidney disease: Secondary | ICD-10-CM | POA: Diagnosis not present

## 2019-04-03 DIAGNOSIS — R42 Dizziness and giddiness: Secondary | ICD-10-CM | POA: Diagnosis present

## 2019-04-03 DIAGNOSIS — U071 COVID-19: Secondary | ICD-10-CM | POA: Diagnosis not present

## 2019-04-03 DIAGNOSIS — Z7984 Long term (current) use of oral hypoglycemic drugs: Secondary | ICD-10-CM | POA: Insufficient documentation

## 2019-04-03 DIAGNOSIS — Z79899 Other long term (current) drug therapy: Secondary | ICD-10-CM | POA: Insufficient documentation

## 2019-04-03 DIAGNOSIS — Z8616 Personal history of COVID-19: Secondary | ICD-10-CM

## 2019-04-03 DIAGNOSIS — Z91041 Radiographic dye allergy status: Secondary | ICD-10-CM | POA: Diagnosis not present

## 2019-04-03 HISTORY — DX: Personal history of COVID-19: Z86.16

## 2019-04-03 LAB — COMPREHENSIVE METABOLIC PANEL
ALT: 13 U/L (ref 0–44)
AST: 27 U/L (ref 15–41)
Albumin: 3.8 g/dL (ref 3.5–5.0)
Alkaline Phosphatase: 70 U/L (ref 38–126)
Anion gap: 10 (ref 5–15)
BUN: 19 mg/dL (ref 8–23)
CO2: 28 mmol/L (ref 22–32)
Calcium: 8.5 mg/dL — ABNORMAL LOW (ref 8.9–10.3)
Chloride: 97 mmol/L — ABNORMAL LOW (ref 98–111)
Creatinine, Ser: 3.04 mg/dL — ABNORMAL HIGH (ref 0.61–1.24)
GFR calc Af Amer: 23 mL/min — ABNORMAL LOW (ref >60)
GFR calc non Af Amer: 20 mL/min — ABNORMAL LOW (ref >60)
Glucose, Bld: 182 mg/dL — ABNORMAL HIGH (ref 70–99)
Potassium: 4.5 mmol/L (ref 3.5–5.1)
Sodium: 135 mmol/L (ref 135–145)
Total Bilirubin: 0.8 mg/dL (ref 0.3–1.2)
Total Protein: 7 g/dL (ref 6.5–8.1)

## 2019-04-03 LAB — CBC WITH DIFFERENTIAL/PLATELET
Abs Immature Granulocytes: 0.01 10*3/uL (ref 0.00–0.07)
Basophils Absolute: 0 10*3/uL (ref 0.0–0.1)
Basophils Relative: 0 %
Eosinophils Absolute: 0 10*3/uL (ref 0.0–0.5)
Eosinophils Relative: 0 %
HCT: 32.1 % — ABNORMAL LOW (ref 39.0–52.0)
Hemoglobin: 9.9 g/dL — ABNORMAL LOW (ref 13.0–17.0)
Immature Granulocytes: 0 %
Lymphocytes Relative: 11 %
Lymphs Abs: 0.5 10*3/uL — ABNORMAL LOW (ref 0.7–4.0)
MCH: 26.1 pg (ref 26.0–34.0)
MCHC: 30.8 g/dL (ref 30.0–36.0)
MCV: 84.5 fL (ref 80.0–100.0)
Monocytes Absolute: 1.3 10*3/uL — ABNORMAL HIGH (ref 0.1–1.0)
Monocytes Relative: 26 %
Neutro Abs: 3.2 10*3/uL (ref 1.7–7.7)
Neutrophils Relative %: 63 %
Platelets: 169 10*3/uL (ref 150–400)
RBC: 3.8 MIL/uL — ABNORMAL LOW (ref 4.22–5.81)
RDW: 19.2 % — ABNORMAL HIGH (ref 11.5–15.5)
WBC: 5.1 10*3/uL (ref 4.0–10.5)
nRBC: 0 % (ref 0.0–0.2)

## 2019-04-03 LAB — PROTIME-INR
INR: 1.1 (ref 0.8–1.2)
Prothrombin Time: 14.3 seconds (ref 11.4–15.2)

## 2019-04-03 LAB — SARS CORONAVIRUS 2 AG (30 MIN TAT): SARS Coronavirus 2 Ag: POSITIVE — AB

## 2019-04-03 LAB — LACTIC ACID, PLASMA
Lactic Acid, Venous: 1.7 mmol/L (ref 0.5–1.9)
Lactic Acid, Venous: 1.6 mmol/L (ref 0.5–1.9)

## 2019-04-03 MED ORDER — ACETAMINOPHEN 325 MG PO TABS
650.0000 mg | ORAL_TABLET | Freq: Once | ORAL | Status: AC
  Administered 2019-04-03: 650 mg via ORAL
  Filled 2019-04-03: qty 2

## 2019-04-03 MED ORDER — SODIUM CHLORIDE 0.9 % IV BOLUS
500.0000 mL | Freq: Once | INTRAVENOUS | Status: AC
  Administered 2019-04-03: 500 mL via INTRAVENOUS

## 2019-04-03 NOTE — ED Provider Notes (Signed)
[facility]PART[facility] Provider Note   CSN: [id number] Arrival date & time: 04/03/19  7824     History Chief Complaint  Patient presents with  . Dizziness    [name] is a 69 y.o. male.  69 year old male with extensive past medical history below including multiple myeloma, type 2 diabetes mellitus, CKD, hypertension who presents with sweats and chills.  Patient tells me that he has not been feeling well for 2 to 3 weeks.  He tells triage that he has had 4 to 5 days of symptoms.  He reports chills and hot flashes associated with generalized weakness and malaise.  He reports decreased appetite although he denies any loss of taste or smell.  No cough, shortness of breath, chest pain, abdominal pain, urinary symptoms, or vomiting.  He lives alone and denies any sick contacts.  The history is provided by the patient.  Dizziness      Past Medical History:  Diagnosis Date  . Anemia   . 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 Anemia, iron deficiency 03/12/2011  . Anxiety   . Anxiety disorder   . Arthralgia of multiple joints 12/31/2010  . Arthritis   . Constipation   . Diabetes mellitus without complication ([place])   . Diabetic neuropathy ([place])   . Diverticulosis   . Epidermoid cyst of skin of chest 08/29/2018  . Fibromyalgia   . GERD (gastroesophageal reflux disease)   . Headache   . Hemorrhoids   . Herpes zoster   . Hyperlipidemia   . Hypertension   . Hypotestosteronism 03/17/2012  . Multiple myeloma   . Multiple myeloma ([place])   . Myeloma ([place]) 12/31/2010  . Nevus of left elbow 08/29/2018  . Pneumonia   . Renal insufficiency   . Type 2 diabetes mellitus ([place])   . Urinary retention     Patient Active Problem List   Diagnosis Date Noted  . Nevus of left elbow 08/29/2018  . Epidermoid cyst of skin of chest 08/29/2018  . Cervical spondylosis with myelopathy 06/05/2016  . Anemia of chronic renal failure 12/17/2015  . Preventive measure 12/17/2015  .  Type 2 diabetes mellitus ([place]) 06/05/2015  . Edema extremities 06/05/2015  . Benign hypertension 06/05/2015  . Abdominal pain in male   . Right sided abdominal pain   . Lower abdominal pain   . Multiple myeloma ([place])   . Acute abdominal pain 11/14/2014  . Generalized abdominal pain   . Esophageal dysphagia   . Hypertensive lower esophageal sphincter   . Dysphagia 09/26/2014  . Rectal bleeding 09/26/2014  . Nausea with vomiting 09/26/2014  . Constipation 09/26/2014  . Hypotestosteronism 03/17/2012  . Can't get food down 01/17/2012  . Hematochezia 07/12/2011  . Anemia, iron deficiency 03/12/2011  . 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 Myeloma ([place]) 12/31/2010  . Diabetes mellitus, type II ([place]) 12/31/2010  . Arthralgia of multiple joints 12/31/2010  . GERD 03/09/2008  . DIVERTICULOSIS-COLON 03/09/2008  . Other dysphagia 03/09/2008    Past Surgical History:  Procedure Laterality Date  . CATARACT EXTRACTION     bi-lateral  . COLONOSCOPY    . COLONOSCOPY  07/13/2011   Procedure: COLONOSCOPY;  Surgeon: Ladene Artist, MD,FACG;  Location: [facility];  Service: Endoscopy;  Laterality: N/A;  . CYST EXCISION Right 08/29/2018   Procedure: CYST REMOVAL;  Surgeon: Fanny Skates, MD;  Location: [facility];  Service: General;  Laterality: Right;  . ESOPHAGOGASTRODUODENOSCOPY    . IR CHEST FLUORO  09/22/2016  . IR FLUORO GUIDE PORT INSERTION RIGHT  09/23/2016  . IR REMOVAL TUN ACCESS W/ PORT W/O FL MOD SED  09/23/2016  . IR US GUIDE VASC ACCESS LEFT  09/23/2016  . KNEE ARTHROSCOPY     left  . KNEE SURGERY    . NEVUS EXCISION N/A 08/29/2018   Procedure: , NEVUS LEFT ELBOW;  Surgeon: Fanny Skates, MD;  Location: [facility];  Service: General;  Laterality: N/A;  . PORTACATH PLACEMENT    . POSTERIOR CERVICAL FUSION/FORAMINOTOMY N/A 06/05/2016   Procedure: LAMINECTOMY CERVICAL 3 - CERVICAL 6 WITH LATERAL MASS FUSION;  Surgeon: Consuella Lose, MD;  Location: [facility];  Service: Neurosurgery;  Laterality: N/A;  LAMINECTOMY CERVICAL 3 - CERVICAL 6 WITH LATERAL MASS FUSION  . TONSILLECTOMY    . TRANSURETHRAL RESECTION OF PROSTATE  01/2011   Dr. at [facility] in [place]       Family History  Problem Relation Age of Onset  . Breast cancer Sister   . Breast cancer Mother   . Heart disease Mother   . Diabetes Sister   . Bone cancer Father   . Heart disease Sister   . Heart disease Daughter   . Throat cancer Brother   . Stomach cancer Brother   . Colon cancer Neg Hx     Social History   Tobacco Use  . Smoking status: Never Smoker  . Smokeless tobacco: Never Used  Substance Use Topics  . Alcohol use: Not Currently    Alcohol/week: 0.0 standard drinks  . Drug use: Not Currently    Comment: nothing in twenty years    Home Medications Prior to Admission medications   Medication Sig Start Date End Date Taking? Authorizing Provider  amLODipine (NORVASC) 5 MG tablet Take 1 tablet by mouth daily. 03/03/19   [provider]  atorvastatin (LIPITOR) 40 MG tablet daily. 08/20/18   [provider]  CARAFATE 1 GM/10ML suspension Take 1 g by mouth 4 (four) times daily.  07/28/11   [provider]  cyclobenzaprine (FLEXERIL) 10 MG tablet Take 10 mg by mouth 3 (three) times daily. 12/06/17   [provider]  dicyclomine (BENTYL) 10 MG capsule TAKE 1 CAPSULE BY MOUTH THREE TIMES A DAY 11/04/18   [place], Holli Humbles, NP  dronabinol (MARINOL) 2.5 MG capsule Take 1 capsule (2.5 mg total) by mouth 2 (two) times daily before a meal. 12/09/18   Ennever, Rudell Cobb, MD  EMBEDA 20-0.8 MG CPCR Take 1 capsule by mouth 3 (three) times daily.  11/09/17   [provider]  furosemide (LASIX) 40 MG tablet Take 40 mg by mouth daily.  07/01/16   [provider]  gabapentin (NEURONTIN) 300 MG capsule Take one capsule by mouth three times a day. 01/27/19   [place], Holli Humbles, NP  glimepiride (AMARYL) 2 MG tablet Take 2 mg by  mouth daily. 01/11/19   [provider]  glipiZIDE (GLUCOTROL) 5 MG tablet Take 5 mg by mouth 2 (two) times daily before a meal.     [provider]  lactulose (CHRONULAC) 10 GM/15ML solution Take 30 mLs (20 g total) by mouth daily. 02/02/19   Volanda Napoleon, MD  latanoprost (XALATAN) 0.005 % ophthalmic solution Place 1 drop into the right eye every evening.     [provider]  lidocaine-prilocaine (EMLA) cream Apply 1 application topically as needed. 10/05/16   Volanda Napoleon, MD  Linaclotide Rolan Lipa) 145 MCG CAPS capsule Take  1 capsule (145 mcg total) by mouth daily before breakfast. Patient taking differently: Take 145 mcg by mouth daily as needed (constipation).  11/16/14   [place], Laban Emperor, PA-C  LORazepam (ATIVAN) 0.5 MG tablet Take 1 tablet (0.5 mg total) by mouth every 6 (six) hours as needed (Nausea or vomiting). 08/23/17   [place], Holli Humbles, NP  losartan (COZAAR) 50 MG tablet Take 50 mg by mouth daily. 02/25/19   [provider]  mesalamine (LIALDA) 1.2 g EC tablet Take 1 tablet (1.2 g total) by mouth daily with breakfast. 07/04/18   [place], Holli Humbles, NP  metFORMIN (GLUCOPHAGE) 500 MG tablet Take 500 mg by mouth 2 (two) times daily. 12/23/18   [provider]  metoprolol succinate (TOPROL-XL) 100 MG 24 hr tablet Take 100 mg by mouth daily.  12/14/17   [provider]  mirabegron ER (MYRBETRIQ) 50 MG TB24 tablet Take 50 mg by mouth daily.    [provider]  morphine (MS CONTIN) 30 MG 12 hr tablet Take 30 mg by mouth 2 (two) times daily. 06/29/18   [provider]  Morphine-Naltrexone (EMBEDA) 30-1.2 MG CPCR Take 3 (three) times daily by mouth.    [provider]  omeprazole (PRILOSEC) 40 MG capsule Take 1 capsule (40 mg total) by mouth 2 (two) times daily. 11/03/18   Volanda Napoleon, MD  ondansetron (ZOFRAN) 8 MG tablet TAKE 1 TABLET(8 MG) BY MOUTH TWICE DAILY AS NEEDED FOR NAUSEA OR VOMITING 02/27/19   Volanda Napoleon, MD  orphenadrine (NORFLEX) 100 MG tablet Take 1 tablet (100 mg total) by mouth 2 (two) times daily. 05/22/16   Charlesetta Shanks, MD  oxyCODONE-acetaminophen (PERCOCET) 10-325 MG tablet Take 1 tablet by mouth every 6 (six) hours as needed. 03/05/19   [provider]  pioglitazone (ACTOS) 30 MG tablet Take 30 mg by mouth daily. 03/06/18   [provider]  Polyvinyl Alcohol-Povidone (CLEAR EYES ALL SEASONS) 5-6 MG/ML SOLN Place 2 drops into both eyes 3 (three) times daily as needed (drye eyes.).     [provider]  potassium chloride SA (K-DUR,KLOR-CON) 20 MEQ tablet Take 1 tablet (20 mEq total) by mouth 2 (two) times daily. 11/07/14   Volanda Napoleon, MD  prochlorperazine (COMPAZINE) 10 MG tablet take 1 tablet by mouth every 6 hours if needed for nausea and vomiting 02/27/19   Volanda Napoleon, MD  RELISTOR 150 MG TABS Take 150 mg by mouth. 03/22/18   [provider]  Sennosides 25 MG TABS Take 25 mg by mouth daily as needed (constipation).     [provider]  Tafluprost, PF, (ZIOPTAN) 0.0015 % SOLN Place 1 drop into both eyes daily.     [provider]  topiramate (TOPAMAX) 25 MG tablet Take 25 mg by mouth daily. 03/13/19   [provider]  torsemide (DEMADEX) 20 MG tablet Take 20 mg by mouth. Take two tablets total of 40 mg by mouth daily. 01/12/19   [provider]  Vitamin D, Ergocalciferol, (DRISDOL) 50000 UNITS CAPS capsule Take 50,000 Units by mouth every Saturday.     [provider]  XTAMPZA ER 13.5 MG C12A Take 1 capsule by mouth 2 (two) times daily. 06/16/18   [provider]  potassium chloride (KLOR-CON) 20 MEQ packet Take 20 mEq by mouth daily.   05/17/11  [provider]    Allergies    Iodinated diagnostic agents and Iodine  Review of Systems   Review of Systems  Neurological: Positive for dizziness.   All other systems reviewed and are negative except that which was mentioned in  HPI  Physical Exam Updated Vital Signs BP 124/75 (BP Location: Left Arm)   Pulse 98   Temp 99.3 F (37.4 C) (Oral)   Resp 16   Ht _0  (1.803 m)   Wt 92.2 kg   SpO2 98%   BMI 28.34 kg/m   Physical Exam Vitals and nursing note reviewed.  Constitutional:      General: He is not in acute distress.    Appearance: He is well-developed.  HENT:     Head: Normocephalic and atraumatic.  Eyes:     Conjunctiva/sclera: Conjunctivae normal.  Cardiovascular:     Rate and Rhythm: Normal rate and regular rhythm.     Heart sounds: Normal heart sounds. No murmur.  Pulmonary:     Effort: Pulmonary effort is normal.     Breath sounds: Normal breath sounds.  Abdominal:     General: There is no distension.     Palpations: Abdomen is soft.     Tenderness: There is no abdominal tenderness.  Musculoskeletal:        General: No tenderness.     Cervical back: Neck supple.  Skin:    General: Skin is warm and dry.  Neurological:     Mental Status: He is alert and oriented to person, place, and time.     Comments: Fluent speech  Psychiatric:        Judgment: Judgment normal.     ED Results / Procedures / Treatments   Labs (all labs ordered are listed, but only abnormal results are displayed) Labs Reviewed  SARS CORONAVIRUS 2 AG (30 MIN TAT) - Abnormal; Notable for the following components:      Result Value   SARS Coronavirus 2 Ag POSITIVE (*)    All other components within normal limits  COMPREHENSIVE METABOLIC PANEL - Abnormal; Notable for the following components:   Chloride 97 (*)    Glucose, Bld 182 (*)    Creatinine, Ser 3.04 (*)    Calcium 8.5 (*)    GFR calc non Af Amer 20 (*)    GFR calc Af Amer 23 (*)    All other components within normal limits  CBC WITH DIFFERENTIAL/PLATELET - Abnormal; Notable for the following components:   RBC 3.80 (*)    Hemoglobin 9.9 (*)    HCT 32.1 (*)    RDW 19.2 (*)    Lymphs Abs 0.5 (*)    Monocytes Absolute 1.3 (*)    All other  components within normal limits  CULTURE, BLOOD (ROUTINE X 2)  CULTURE, BLOOD (ROUTINE X 2)  LACTIC ACID, PLASMA  LACTIC ACID, PLASMA  PROTIME-INR  URINALYSIS, ROUTINE W REFLEX MICROSCOPIC    EKG None  Radiology DG Chest 2 View  Result Date: 04/03/2019 CLINICAL DATA:  Hot flashes and weakness EXAM: CHEST - 2 VIEW COMPARISON:  07/04/2018 FINDINGS: There is a right-sided Port-A-Cath in satisfactory position. There is no focal consolidation. There is mild lingular scarring. There is no pleural effusion or pneumothorax. The heart and mediastinal contours are unremarkable. There is no acute osseous abnormality. IMPRESSION: No active cardiopulmonary disease. Electronically Signed   By: Kathreen Devoid   On: 04/03/2019 10:44    Procedures Procedures (including critical care time)  Medications Ordered in ED Medications  acetaminophen (TYLENOL) tablet 650 mg (650 mg Oral Given 04/03/19 1043)  sodium chloride 0.9 % bolus 500 mL (500 mLs  Intravenous New Bag/Given 04/03/19 1145)    ED Course  I have reviewed the triage vital signs and the nursing notes.  Pertinent labs & imaging results that were available during my care of the patient were reviewed by me and considered in my medical decision making (see chart for details).    MDM Rules/Calculators/A&P                      Patient was alert and breathing comfortably on room air.T 101.3 initially, improved w/ tylenol. Normal O2 sat. Clear breath sounds. Labs show Cr 3.04 which is slightly elevated from baseline ~2.5, gave fluid bolus. Lactate normal. WBC 5.1, Hgb 9.9 similar to previous. CXR clear. COVID-19 positive, which fits patient's symptoms. His timeline of symptoms is unclear, however his VS and pulmonary exam are reassuring today.  I have encouraged him to follow closely with his PCP, to continue hydration at home, and to return to the ED if he has any breathing problems.  His PCP can set up outpatient antibody infusion.  He voiced  understanding of treatment plan and return precautions.  Terelle Dobler was evaluated in Emergency Department on 04/03/2019 for the symptoms described in the history of present illness. He was evaluated in the context of the global COVID-19 pandemic, which necessitated consideration that the patient might be at risk for infection with the SARS-CoV-2 virus that causes COVID-19. Institutional protocols and algorithms that pertain to the evaluation of patients at risk for COVID-19 are in a state of rapid change based on information released by regulatory bodies including the CDC and federal and state organizations. These policies and algorithms were followed during the patient's care in the ED.  Final Clinical Impression(s) / ED Diagnoses Final diagnoses:  COVID-19 virus infection    Rx / DC Orders ED Discharge Orders    None       Jessicah Croll, Wenda Overland, MD 04/03/19 1320

## 2019-04-03 NOTE — ED Triage Notes (Addendum)
Cycles of chills and hot flashes with weakness, and dizziness x 4-5 days. Sts "I can't eat nothin".

## 2019-04-03 NOTE — ED Notes (Signed)
Pt transported to XR.  

## 2019-04-04 ENCOUNTER — Telehealth: Payer: Self-pay | Admitting: Nurse Practitioner

## 2019-04-04 ENCOUNTER — Telehealth: Payer: Self-pay | Admitting: Hematology & Oncology

## 2019-04-04 NOTE — Telephone Encounter (Signed)
Called and advised patient of appointments that have been moved out due to positive COVID results on 2/8/ per 2/9 sch msg.  Letter/Calendar mailed

## 2019-04-04 NOTE — Telephone Encounter (Signed)
Called to Discuss with patient about Covid symptoms and the use of bamlanivimab, a monoclonal antibody infusion for those with mild to moderate Covid symptoms and at a high risk of hospitalization.     Pt states that symptoms started 3 weeks ago.

## 2019-04-04 NOTE — Telephone Encounter (Signed)
Called to Discuss with patient about Covid symptoms and the use of bamlanivimab, a monoclonal antibody infusion for those with mild to moderate Covid symptoms and at a high risk of hospitalization.     Pt is qualified for this infusion at the [facility] infusion center due to co-morbid conditions and/or a member of an at-risk group.     Unable to reach pt - left message

## 2019-04-08 LAB — CULTURE, BLOOD (ROUTINE X 2)
Culture: NO GROWTH
Culture: NO GROWTH
Special Requests: ADEQUATE
Special Requests: ADEQUATE

## 2019-04-10 ENCOUNTER — Ambulatory Visit (HOSPITAL_COMMUNITY)
Admission: RE | Admit: 2019-04-10 | Discharge: 2019-04-10 | Disposition: A | Discharge status: Home / Self Care | Payer: Medicare Other | Source: Ambulatory Visit | Attending: Pulmonary Disease | Admitting: Pulmonary Disease

## 2019-04-10 ENCOUNTER — Other Ambulatory Visit: Payer: Self-pay | Admitting: Physician Assistant

## 2019-04-10 DIAGNOSIS — U071 COVID-19: Secondary | ICD-10-CM | POA: Insufficient documentation

## 2019-04-10 DIAGNOSIS — E1169 Type 2 diabetes mellitus with other specified complication: Secondary | ICD-10-CM

## 2019-04-10 DIAGNOSIS — Z23 Encounter for immunization: Secondary | ICD-10-CM | POA: Diagnosis not present

## 2019-04-10 DIAGNOSIS — I10 Essential (primary) hypertension: Secondary | ICD-10-CM | POA: Insufficient documentation

## 2019-04-10 MED ORDER — METHYLPREDNISOLONE SODIUM SUCC 125 MG IJ SOLR: 125.0000 mg | Freq: Once | INTRAMUSCULAR | Status: DC | PRN

## 2019-04-10 MED ORDER — ALBUTEROL SULFATE HFA 108 (90 BASE) MCG/ACT IN AERS: 2.0000 | INHALATION_SPRAY | Freq: Once | RESPIRATORY_TRACT | Status: DC | PRN

## 2019-04-10 MED ORDER — FAMOTIDINE IN NACL 20-0.9 MG/50ML-% IV SOLN: 20.0000 mg | Freq: Once | INTRAVENOUS | Status: DC | PRN

## 2019-04-10 MED ORDER — DIPHENHYDRAMINE HCL 50 MG/ML IJ SOLN: 50.0000 mg | Freq: Once | INTRAMUSCULAR | Status: DC | PRN

## 2019-04-10 MED ORDER — EPINEPHRINE 0.3 MG/0.3ML IJ SOAJ: 0.3000 mg | Freq: Once | INTRAMUSCULAR | Status: DC | PRN

## 2019-04-10 MED ORDER — SODIUM CHLORIDE 0.9 % IV SOLN
INTRAVENOUS | Status: DC | PRN
  Administered 2019-04-10: 250 mL via INTRAVENOUS

## 2019-04-10 MED ORDER — HEPARIN SOD (PORK) LOCK FLUSH 100 UNIT/ML IV SOLN
500.0000 [IU] | INTRAVENOUS | Status: AC | PRN
  Administered 2019-04-10: 500 [IU]
  Filled 2019-04-10: qty 5

## 2019-04-10 MED ORDER — SODIUM CHLORIDE 0.9 % IV SOLN
700.0000 mg | Freq: Once | INTRAVENOUS | Status: AC
  Administered 2019-04-10: 700 mg via INTRAVENOUS
  Filled 2019-04-10: qty 20

## 2019-04-10 NOTE — Progress Notes (Signed)
  I connected by phone with [name] on 04/10/2019 at 10:02 AM to discuss the potential use of an new treatment for mild to moderate COVID-19 viral infection in non-hospitalized patients.  This patient is a 69 y.o. male that meets the FDA criteria for Emergency Use Authorization of bamlanivimab or casirivimab\imdevimab.  Has a (+) direct SARS-CoV-2 viral test result  Has mild or moderate COVID-19   Is ? 70 years of age and weighs ? 40 kg  Is NOT hospitalized due to COVID-19  Is NOT requiring oxygen therapy or requiring an increase in baseline oxygen flow rate due to COVID-19  Is within 10 days of symptom onset  Has at least one of the high risk factor(s) for progression to severe COVID-19 and/or hospitalization as defined in EUA.  Specific high risk criteria : >/= 70 yo, HTN, DM   I have spoken and communicated the following to the patient or parent/caregiver:  1. FDA has authorized the emergency use of bamlanivimab and casirivimab\imdevimab for the treatment of mild to moderate COVID-19 in adults and pediatric patients with positive results of direct SARS-CoV-2 viral testing who are 31 years of age and older weighing at least 40 kg, and who are at high risk for progressing to severe COVID-19 and/or hospitalization.  2. The significant known and potential risks and benefits of bamlanivimab and casirivimab\imdevimab, and the extent to which such potential risks and benefits are unknown.  3. Information on available alternative treatments and the risks and benefits of those alternatives, including clinical trials.  4. Patients treated with bamlanivimab and casirivimab\imdevimab should continue to self-isolate and use infection control measures (e.g., wear mask, isolate, social distance, avoid sharing personal items, clean and disinfect "high touch" surfaces, and frequent handwashing) according to CDC guidelines.   5. The patient or parent/caregiver has the option to accept or refuse  bamlanivimab or casirivimab\imdevimab .  After reviewing this information with the patient, The patient agreed to proceed with receiving the bamlanimivab infusion and will be provided a copy of the Fact sheet prior to receiving the infusion.   Set up for today @ 12:30pm. Sx onset 03/31/19. Directions given. Orders in.  Angelena Form PA-C 04/10/2019 10:02 AM

## 2019-04-10 NOTE — Progress Notes (Signed)
  Diagnosis: COVID-19  Physician: Dr. Wright  Procedure: [facility]fusion [facility] Med: bamlanivimab infusion - Provided patient with bamlanimivab fact sheet for patients, parents and caregivers prior to infusion.  Complications: No immediate complications noted.  Discharge: Discharged home   [name] 04/10/2019  

## 2019-04-10 NOTE — Discharge Instructions (Signed)

## 2019-04-11 ENCOUNTER — Other Ambulatory Visit: Payer: Medicare Other

## 2019-04-11 ENCOUNTER — Ambulatory Visit: Payer: Medicare Other

## 2019-04-11 ENCOUNTER — Ambulatory Visit: Payer: Medicare Other | Admitting: Hematology & Oncology

## 2019-04-19 ENCOUNTER — Other Ambulatory Visit: Payer: Self-pay | Admitting: Hematology & Oncology

## 2019-04-19 DIAGNOSIS — K219 Gastro-esophageal reflux disease without esophagitis: Secondary | ICD-10-CM

## 2019-04-26 ENCOUNTER — Other Ambulatory Visit: Payer: Self-pay

## 2019-04-26 ENCOUNTER — Inpatient Hospital Stay (HOSPITAL_BASED_OUTPATIENT_CLINIC_OR_DEPARTMENT_OTHER): Payer: Medicare Other | Admitting: Hematology & Oncology

## 2019-04-26 ENCOUNTER — Inpatient Hospital Stay: Payer: Medicare Other

## 2019-04-26 ENCOUNTER — Encounter: Payer: Self-pay | Admitting: Family

## 2019-04-26 ENCOUNTER — Inpatient Hospital Stay: Payer: Medicare Other | Attending: Hematology & Oncology

## 2019-04-26 DIAGNOSIS — D631 Anemia in chronic kidney disease: Secondary | ICD-10-CM | POA: Diagnosis not present

## 2019-04-26 DIAGNOSIS — Z299 Encounter for prophylactic measures, unspecified: Secondary | ICD-10-CM

## 2019-04-26 DIAGNOSIS — G629 Polyneuropathy, unspecified: Secondary | ICD-10-CM

## 2019-04-26 DIAGNOSIS — C9000 Multiple myeloma not having achieved remission: Secondary | ICD-10-CM | POA: Diagnosis present

## 2019-04-26 DIAGNOSIS — R0602 Shortness of breath: Secondary | ICD-10-CM | POA: Insufficient documentation

## 2019-04-26 DIAGNOSIS — E349 Endocrine disorder, unspecified: Secondary | ICD-10-CM

## 2019-04-26 DIAGNOSIS — N189 Chronic kidney disease, unspecified: Secondary | ICD-10-CM | POA: Insufficient documentation

## 2019-04-26 DIAGNOSIS — M4712 Other spondylosis with myelopathy, cervical region: Secondary | ICD-10-CM

## 2019-04-26 DIAGNOSIS — R197 Diarrhea, unspecified: Secondary | ICD-10-CM | POA: Diagnosis not present

## 2019-04-26 DIAGNOSIS — K219 Gastro-esophageal reflux disease without esophagitis: Secondary | ICD-10-CM

## 2019-04-26 DIAGNOSIS — Z8616 Personal history of COVID-19: Secondary | ICD-10-CM | POA: Diagnosis not present

## 2019-04-26 DIAGNOSIS — Z5112 Encounter for antineoplastic immunotherapy: Secondary | ICD-10-CM | POA: Insufficient documentation

## 2019-04-26 DIAGNOSIS — E1122 Type 2 diabetes mellitus with diabetic chronic kidney disease: Secondary | ICD-10-CM | POA: Diagnosis not present

## 2019-04-26 DIAGNOSIS — Z5111 Encounter for antineoplastic chemotherapy: Secondary | ICD-10-CM | POA: Insufficient documentation

## 2019-04-26 DIAGNOSIS — D508 Other iron deficiency anemias: Secondary | ICD-10-CM

## 2019-04-26 LAB — CBC WITH DIFFERENTIAL (CANCER CENTER ONLY)
Abs Immature Granulocytes: 0.02 10*3/uL (ref 0.00–0.07)
Basophils Absolute: 0 10*3/uL (ref 0.0–0.1)
Basophils Relative: 1 %
Eosinophils Absolute: 0.1 10*3/uL (ref 0.0–0.5)
Eosinophils Relative: 3 %
HCT: 30.4 % — ABNORMAL LOW (ref 39.0–52.0)
Hemoglobin: 9.6 g/dL — ABNORMAL LOW (ref 13.0–17.0)
Immature Granulocytes: 0 %
Lymphocytes Relative: 23 %
Lymphs Abs: 1.1 10*3/uL (ref 0.7–4.0)
MCH: 26.1 pg (ref 26.0–34.0)
MCHC: 31.6 g/dL (ref 30.0–36.0)
MCV: 82.6 fL (ref 80.0–100.0)
Monocytes Absolute: 0.7 10*3/uL (ref 0.1–1.0)
Monocytes Relative: 16 %
Neutro Abs: 2.7 10*3/uL (ref 1.7–7.7)
Neutrophils Relative %: 57 %
Platelet Count: 263 10*3/uL (ref 150–400)
RBC: 3.68 MIL/uL — ABNORMAL LOW (ref 4.22–5.81)
RDW: 17.4 % — ABNORMAL HIGH (ref 11.5–15.5)
WBC Count: 4.8 10*3/uL (ref 4.0–10.5)
nRBC: 0 % (ref 0.0–0.2)

## 2019-04-26 LAB — IRON AND TIBC
Iron: 117 ug/dL (ref 42–163)
Saturation Ratios: 46 % (ref 20–55)
TIBC: 253 ug/dL (ref 202–409)
UIBC: 135 ug/dL (ref 117–376)

## 2019-04-26 LAB — CMP (CANCER CENTER ONLY)
ALT: 20 U/L (ref 0–44)
AST: 22 U/L (ref 15–41)
Albumin: 4.3 g/dL (ref 3.5–5.0)
Alkaline Phosphatase: 76 U/L (ref 38–126)
Anion gap: 10 (ref 5–15)
BUN: 21 mg/dL (ref 8–23)
CO2: 28 mmol/L (ref 22–32)
Calcium: 9.3 mg/dL (ref 8.9–10.3)
Chloride: 106 mmol/L (ref 98–111)
Creatinine: 2.03 mg/dL — ABNORMAL HIGH (ref 0.61–1.24)
GFR, Est AFR Am: 38 mL/min — ABNORMAL LOW (ref >60)
GFR, Estimated: 32 mL/min — ABNORMAL LOW (ref >60)
Glucose, Bld: 110 mg/dL — ABNORMAL HIGH (ref 70–99)
Potassium: 3.3 mmol/L — ABNORMAL LOW (ref 3.5–5.1)
Sodium: 144 mmol/L (ref 135–145)
Total Bilirubin: 0.3 mg/dL (ref 0.3–1.2)
Total Protein: 7.7 g/dL (ref 6.5–8.1)

## 2019-04-26 LAB — FERRITIN: Ferritin: 3260 ng/mL — ABNORMAL HIGH (ref 24–336)

## 2019-04-26 LAB — LACTATE DEHYDROGENASE: LDH: 270 U/L — ABNORMAL HIGH (ref 98–192)

## 2019-04-26 MED ORDER — SODIUM CHLORIDE 0.9% FLUSH
10.0000 mL | INTRAVENOUS | Status: DC | PRN
  Administered 2019-04-26: 17:00:00 10 mL
  Filled 2019-04-26: qty 10

## 2019-04-26 MED ORDER — DEXAMETHASONE SODIUM PHOSPHATE 10 MG/ML IJ SOLN
INTRAMUSCULAR | Status: AC
  Filled 2019-04-26: qty 1

## 2019-04-26 MED ORDER — SODIUM CHLORIDE 0.9 % IV SOLN
300.0000 mg/m2 | Freq: Once | INTRAVENOUS | Status: AC
  Administered 2019-04-26: 620 mg via INTRAVENOUS
  Filled 2019-04-26: qty 31

## 2019-04-26 MED ORDER — HYDROMORPHONE HCL 4 MG/ML IJ SOLN
INTRAMUSCULAR | Status: AC
  Filled 2019-04-26: qty 1

## 2019-04-26 MED ORDER — HYDROMORPHONE HCL 1 MG/ML IJ SOLN
2.0000 mg | Freq: Once | INTRAMUSCULAR | Status: AC
  Administered 2019-04-26: 10:00:00 2 mg via INTRAVENOUS

## 2019-04-26 MED ORDER — PALONOSETRON HCL INJECTION 0.25 MG/5ML
INTRAVENOUS | Status: AC
  Filled 2019-04-26: qty 5

## 2019-04-26 MED ORDER — DARBEPOETIN ALFA 300 MCG/0.6ML IJ SOSY
PREFILLED_SYRINGE | INTRAMUSCULAR | Status: AC
  Filled 2019-04-26: qty 0.6

## 2019-04-26 MED ORDER — SODIUM CHLORIDE 0.9 % IV SOLN
40.0000 mg | Freq: Once | INTRAVENOUS | Status: AC
  Administered 2019-04-26: 40 mg via INTRAVENOUS
  Filled 2019-04-26: qty 4

## 2019-04-26 MED ORDER — DEXAMETHASONE SODIUM PHOSPHATE 10 MG/ML IJ SOLN
10.0000 mg | Freq: Once | INTRAMUSCULAR | Status: AC
  Administered 2019-04-26: 10:00:00 10 mg via INTRAVENOUS

## 2019-04-26 MED ORDER — DEXTROSE 5 % IV SOLN
29.0000 mg/m2 | Freq: Once | INTRAVENOUS | Status: AC
  Administered 2019-04-26: 10:00:00 60 mg via INTRAVENOUS
  Filled 2019-04-26: qty 30

## 2019-04-26 MED ORDER — SODIUM CHLORIDE 0.9 % IV SOLN
60.0000 mg | Freq: Once | INTRAVENOUS | Status: AC
  Administered 2019-04-26: 60 mg via INTRAVENOUS
  Filled 2019-04-26: qty 20
  Filled 2019-04-26: qty 10

## 2019-04-26 MED ORDER — SODIUM CHLORIDE 0.9 % IV SOLN
Freq: Once | INTRAVENOUS | Status: AC
  Filled 2019-04-26: qty 250

## 2019-04-26 MED ORDER — SODIUM CHLORIDE 0.9 % IV SOLN
Freq: Once | INTRAVENOUS | Status: DC
  Filled 2019-04-26: qty 250

## 2019-04-26 MED ORDER — HEPARIN SOD (PORK) LOCK FLUSH 100 UNIT/ML IV SOLN
500.0000 [IU] | Freq: Once | INTRAVENOUS | Status: AC | PRN
  Administered 2019-04-26: 17:00:00 500 [IU]
  Filled 2019-04-26: qty 5

## 2019-04-26 MED ORDER — PALONOSETRON HCL INJECTION 0.25 MG/5ML
0.2500 mg | Freq: Once | INTRAVENOUS | Status: AC
  Administered 2019-04-26: 10:00:00 0.25 mg via INTRAVENOUS

## 2019-04-26 MED ORDER — DARBEPOETIN ALFA 300 MCG/0.6ML IJ SOSY
300.0000 ug | PREFILLED_SYRINGE | Freq: Once | INTRAMUSCULAR | Status: AC
  Administered 2019-04-26: 300 ug via SUBCUTANEOUS

## 2019-04-26 NOTE — Progress Notes (Unsigned)
Give pamidronate today per Dr. [name].

## 2019-04-26 NOTE — Progress Notes (Signed)
Hematology and Oncology Follow Up Visit  [name] [id number] 03/26/1950 69 y.o. 04/26/2019   Principle Diagnosis:  IgG kappa myeloma  Anemia secondary to renal insufficiency  Intermittent iron - deficiency anemia  Hypotestosteronemia  Current Therapy:   Aredia 60 mg IV q 3 months-- next dose06/2021 Aranesp 300 mcg subcu as needed for hemoglobin less than 11 DepoTestosterone400 mg q4weeks IV iron as indicated Kyprolis/Cytoxan - s/p cycle23   Interim History:  [name] is here today for follow-up and treatment.  Is been quite a while since we treated him.  The problem is that he developed COVID-19 infection.  He was hospitalized.  He is not sure how he contracted the virus.  Thankfully, he did get through.  He still is having some side effects.  He is still somewhat weakened.  He has occasional chills.  He has his fibromyalgia  which is probably worsened.  His blood sugars thankfully not gotten worse on ice.  He has had no issues with bleeding.  He is not eating much.  He is lost weight.  He says that he just tries a little bit and then he gets sick.  He is not on any kind of an acid.  I will have him see back on an antiacid and for him to see this might help.  He has little bit of diarrhea.  I told him to take some over-the-counter Imodium or Kaopectate for this.  The myeloma really is not an issue.  We last checked it back in January, there is no monoclonal spike in his blood.  His IgG level was 950 mg/dL.  His kappa light chain was 3.9 mg/dL.  We are also checking his testosterone level.  Today, it was 385.  There has been no fever.  He has had a little headache.  I would have to say that his performance status right now is probably ECOG 1-2.    Medications:  Allergies as of 04/26/2019      Reactions   Iodinated Diagnostic Agents Rash   Patient states he was instructed not to take IV contrast.  In 2008 he had an unknown reaction, and was told not to take it  again.  He was also told not to take it due to his kidneys.   Iodine Anxiety, Rash, Other (See Comments)   Didn't feel right "instructed not to take per MD--something with his port"      Medication List       Accurate as of April 26, 2019  9:15 AM. If you have any questions, ask your nurse or doctor.        STOP taking these medications   glimepiride 2 MG tablet Commonly known as: AMARYL Stopped by: Volanda Napoleon, MD   glipiZIDE 5 MG tablet Commonly known as: GLUCOTROL Stopped by: Volanda Napoleon, MD   metFORMIN 500 MG tablet Commonly known as: GLUCOPHAGE Stopped by: Volanda Napoleon, MD     TAKE these medications   amLODipine 5 MG tablet Commonly known as: NORVASC Take 1 tablet by mouth daily.   atorvastatin 40 MG tablet Commonly known as: LIPITOR daily.   Carafate 1 GM/10ML suspension Generic drug: sucralfate Take 1 g by mouth 4 (four) times daily.   Clear Eyes All Seasons 5-6 MG/ML Soln Generic drug: Polyvinyl Alcohol-Povidone Place 2 drops into both eyes 3 (three) times daily as needed (drye eyes.).   cyclobenzaprine 10 MG tablet Commonly known as: FLEXERIL Take 10 mg by mouth 3 (three) times  daily.   dicyclomine 10 MG capsule Commonly known as: BENTYL TAKE 1 CAPSULE BY MOUTH THREE TIMES A DAY   dronabinol 2.5 MG capsule Commonly known as: MARINOL Take 1 capsule (2.5 mg total) by mouth 2 (two) times daily before a meal.   Embeda 30-1.2 MG Cpcr Generic drug: Morphine-Naltrexone Take 3 (three) times daily by mouth.   Embeda 20-0.8 MG Cpcr Generic drug: Morphine-Naltrexone Take 1 capsule by mouth 3 (three) times daily.   furosemide 40 MG tablet Commonly known as: LASIX Take 40 mg by mouth daily.   gabapentin 300 MG capsule Commonly known as: NEURONTIN Take one capsule by mouth three times a day. What changed: when to take this   lactulose 10 GM/15ML solution Commonly known as: CHRONULAC Take 30 mLs (20 g total) by mouth daily.     latanoprost 0.005 % ophthalmic solution Commonly known as: XALATAN Place 1 drop into the right eye every evening.   lidocaine-prilocaine cream Commonly known as: EMLA Apply 1 application topically as needed.   linaclotide 145 MCG Caps capsule Commonly known as: LINZESS Take 1 capsule (145 mcg total) by mouth daily before breakfast. What changed:   when to take this  reasons to take this   LORazepam 0.5 MG tablet Commonly known as: Ativan Take 1 tablet (0.5 mg total) by mouth every 6 (six) hours as needed (Nausea or vomiting).   losartan 50 MG tablet Commonly known as: COZAAR Take 50 mg by mouth daily.   mesalamine 1.2 g EC tablet Commonly known as: Lialda Take 1 tablet (1.2 g total) by mouth daily with breakfast.   metoprolol succinate 100 MG 24 hr tablet Commonly known as: TOPROL-XL Take 100 mg by mouth daily.   mirabegron ER 50 MG Tb24 tablet Commonly known as: MYRBETRIQ Take 50 mg by mouth daily.   morphine 30 MG 12 hr tablet Commonly known as: MS CONTIN Take 30 mg by mouth 2 (two) times daily.   omeprazole 40 MG capsule Commonly known as: PRILOSEC TAKE 1 CAPSULE BY MOUTH TWICE A DAY   ondansetron 8 MG tablet Commonly known as: ZOFRAN TAKE 1 TABLET(8 MG) BY MOUTH TWICE DAILY AS NEEDED FOR NAUSEA OR VOMITING   orphenadrine 100 MG tablet Commonly known as: NORFLEX Take 1 tablet (100 mg total) by mouth 2 (two) times daily.   oxyCODONE-acetaminophen 10-325 MG tablet Commonly known as: PERCOCET Take 1 tablet by mouth every 6 (six) hours as needed.   pioglitazone 30 MG tablet Commonly known as: ACTOS Take 30 mg by mouth daily.   potassium chloride SA 20 MEQ tablet Commonly known as: KLOR-CON Take 1 tablet (20 mEq total) by mouth 2 (two) times daily.   prochlorperazine 10 MG tablet Commonly known as: COMPAZINE take 1 tablet by mouth every 6 hours if needed for nausea and vomiting   Relistor 150 MG Tabs Generic drug: Methylnaltrexone Bromide Take  150 mg by mouth.   Sennosides 25 MG Tabs Take 25 mg by mouth daily as needed (constipation).   topiramate 25 MG tablet Commonly known as: TOPAMAX Take 25 mg by mouth daily.   torsemide 20 MG tablet Commonly known as: DEMADEX Take 20 mg by mouth. Take two tablets total of 40 mg by mouth daily.   Trulicity 0.86 PY/1.9JK Sopn Generic drug: Dulaglutide SMARTSIG:1 Pre-Filled Pen Syringe SUB-Q Once a Week   Vitamin D (Ergocalciferol) 1.25 MG (50000 UNIT) Caps capsule Commonly known as: DRISDOL Take 50,000 Units by mouth every Saturday.   Xtampza ER 13.5 MG  C12a Generic drug: oxyCODONE ER Take 1 capsule by mouth 2 (two) times daily.   Zioptan 0.0015 % Soln Generic drug: Tafluprost (PF) Place 1 drop into both eyes daily.       Allergies:  Allergies  Allergen Reactions  . Iodinated Diagnostic Agents Rash    Patient states he was instructed not to take IV contrast.  In 2008 he had an unknown reaction, and was told not to take it again.  He was also told not to take it due to his kidneys.  . Iodine Anxiety, Rash and Other (See Comments)    Didn't feel right "instructed not to take per MD--something with his port"     Past Medical History, Surgical history, Social history, and Family History were reviewed and updated.  Review of Systems: Review of Systems  Constitutional: Positive for malaise/fatigue and weight loss.  Respiratory: Positive for shortness of breath.   Cardiovascular: Positive for palpitations.  Gastrointestinal: Positive for diarrhea, heartburn and vomiting.  Genitourinary: Negative.   Musculoskeletal: Positive for joint pain and myalgias.  Skin: Negative.   Neurological: Negative.   Endo/Heme/Allergies: Negative.   Psychiatric/Behavioral: Negative.      Physical Exam:  vitals were not taken for this visit.   Wt Readings from Last 3 Encounters:  04/26/19 196 lb (88.9 kg)  04/03/19 203 lb 3.2 oz (92.2 kg)  03/20/19 205 lb (93 kg)   Physical  Exam Vitals reviewed.  HENT:     Head: Normocephalic and atraumatic.  Eyes:     Pupils: Pupils are equal, round, and reactive to light.  Cardiovascular:     Rate and Rhythm: Normal rate and regular rhythm.     Heart sounds: Normal heart sounds.  Pulmonary:     Effort: Pulmonary effort is normal.     Breath sounds: Normal breath sounds.  Abdominal:     General: Bowel sounds are normal.     Palpations: Abdomen is soft.  Musculoskeletal:        General: No tenderness or deformity. Normal range of motion.     Cervical back: Normal range of motion.  Lymphadenopathy:     Cervical: No cervical adenopathy.  Skin:    General: Skin is warm and dry.     Findings: No erythema or rash.  Neurological:     Mental Status: He is alert and oriented to person, place, and time.  Psychiatric:        Behavior: Behavior normal.        Thought Content: Thought content normal.        Judgment: Judgment normal.       Lab Results  Component Value Date   WBC 4.8 04/26/2019   HGB 9.6 (L) 04/26/2019   HCT 30.4 (L) 04/26/2019   MCV 82.6 04/26/2019   PLT 263 04/26/2019   Lab Results  Component Value Date   FERRITIN 2,382 (H) 03/20/2019   IRON 87 03/20/2019   TIBC 240 03/20/2019   UIBC 153 03/20/2019   IRONPCTSAT 36 03/20/2019   Lab Results  Component Value Date   RETICCTPCT 0.9 03/20/2019   RBC 3.68 (L) 04/26/2019   RETICCTABS 27.5 03/21/2014   Lab Results  Component Value Date   KPAFRELGTCHN 39.4 (H) 03/20/2019   LAMBDASER 25.8 03/20/2019   KAPLAMBRATIO 1.53 03/20/2019   Lab Results  Component Value Date   IGGSERUM 1,090 03/20/2019   IGA 185 03/20/2019   IGMSERUM 29 03/20/2019   Lab Results  Component Value Date   TOTALPROTELP 6.8 03/20/2019  ALBUMINELP 3.9 03/20/2019   A1GS 0.2 03/20/2019   A2GS 1.0 03/20/2019   BETS 0.9 03/20/2019   BETA2SER 0.5 11/07/2014   GAMS 0.7 03/20/2019   MSPIKE Not Observed 03/20/2019   SPEI Comment 12/26/2018     Chemistry       Component Value Date/Time   NA 144 04/26/2019 0820   NA 143 02/22/2017 0744   K 3.3 (L) 04/26/2019 0820   K 4.0 02/22/2017 0744   CL 106 04/26/2019 0820   CL 106 02/22/2017 0744   CO2 28 04/26/2019 0820   CO2 27 02/22/2017 0744   BUN 21 04/26/2019 0820   BUN 26 (H) 02/22/2017 0744   CREATININE 2.03 (H) 04/26/2019 0820   CREATININE 2.3 (H) 02/22/2017 0744      Component Value Date/Time   CALCIUM 9.3 04/26/2019 0820   CALCIUM 9.3 02/22/2017 0744   ALKPHOS 76 04/26/2019 0820   ALKPHOS 80 02/22/2017 0744   AST 22 04/26/2019 0820   ALT 20 04/26/2019 0820   ALT 22 02/22/2017 0744   BILITOT 0.3 04/26/2019 0820       Impression and Plan: [name] is a very pleasant 69 yo African American gentleman with IgG kappa myeloma.   Thankfully, he got through the coronavirus infection.  I suspect he probably will cause problems for him for several months given his other issues.  We will go ahead and give him the Aredia today.  We will get him back on treatment for the myeloma.  Again this is been doing quite well.  Hopefully, he will gradually feel better.  We will see about an antacid.  I will call in for him.  He is on a ton of medications.  Maybe his family doctor will be able to narrow some of this down.  We will plan to see him back in about 3 weeks.   Volanda Napoleon, MD 3/3/20219:15 AM

## 2019-04-27 LAB — PROTEIN ELECTROPHORESIS, SERUM
A/G Ratio: 1.1 (ref 0.7–1.7)
Albumin ELP: 3.8 g/dL (ref 2.9–4.4)
Alpha-1-Globulin: 0.3 g/dL (ref 0.0–0.4)
Alpha-2-Globulin: 1.2 g/dL — ABNORMAL HIGH (ref 0.4–1.0)
Beta Globulin: 1 g/dL (ref 0.7–1.3)
Gamma Globulin: 1.1 g/dL (ref 0.4–1.8)
Globulin, Total: 3.5 g/dL (ref 2.2–3.9)
Total Protein ELP: 7.3 g/dL (ref 6.0–8.5)

## 2019-04-27 LAB — KAPPA/LAMBDA LIGHT CHAINS
Kappa free light chain: 69 mg/L — ABNORMAL HIGH (ref 3.3–19.4)
Kappa, lambda light chain ratio: 1.9 — ABNORMAL HIGH (ref 0.26–1.65)
Lambda free light chains: 36.3 mg/L — ABNORMAL HIGH (ref 5.7–26.3)

## 2019-04-27 LAB — IGG, IGA, IGM
IgA: 204 mg/dL (ref 61–437)
IgG (Immunoglobin G), Serum: 1170 mg/dL (ref 603–1613)
IgM (Immunoglobulin M), Srm: 59 mg/dL (ref 20–172)

## 2019-04-27 LAB — TESTOSTERONE: Testosterone: 385 ng/dL (ref 264–916)

## 2019-04-27 NOTE — Progress Notes (Signed)
Wasted 2 mg of dilaudid on 04/25/2008. Witnessed by [facility] pharmacist

## 2019-04-30 ENCOUNTER — Other Ambulatory Visit: Payer: Self-pay

## 2019-04-30 ENCOUNTER — Emergency Department (HOSPITAL_COMMUNITY)
Admission: EM | Admit: 2019-04-30 | Discharge: 2019-05-01 | Disposition: A | Discharge status: Home / Self Care | Payer: Medicare Other | Attending: Emergency Medicine | Admitting: Emergency Medicine

## 2019-04-30 ENCOUNTER — Encounter (HOSPITAL_COMMUNITY): Payer: Self-pay | Admitting: Emergency Medicine

## 2019-04-30 DIAGNOSIS — N189 Chronic kidney disease, unspecified: Secondary | ICD-10-CM | POA: Insufficient documentation

## 2019-04-30 DIAGNOSIS — J111 Influenza due to unidentified influenza virus with other respiratory manifestations: Secondary | ICD-10-CM

## 2019-04-30 DIAGNOSIS — Z8579 Personal history of other malignant neoplasms of lymphoid, hematopoietic and related tissues: Secondary | ICD-10-CM | POA: Diagnosis not present

## 2019-04-30 DIAGNOSIS — D649 Anemia, unspecified: Secondary | ICD-10-CM

## 2019-04-30 DIAGNOSIS — Z79899 Other long term (current) drug therapy: Secondary | ICD-10-CM | POA: Diagnosis not present

## 2019-04-30 DIAGNOSIS — E1122 Type 2 diabetes mellitus with diabetic chronic kidney disease: Secondary | ICD-10-CM | POA: Insufficient documentation

## 2019-04-30 DIAGNOSIS — I129 Hypertensive chronic kidney disease with stage 1 through stage 4 chronic kidney disease, or unspecified chronic kidney disease: Secondary | ICD-10-CM | POA: Insufficient documentation

## 2019-04-30 DIAGNOSIS — N289 Disorder of kidney and ureter, unspecified: Secondary | ICD-10-CM

## 2019-04-30 DIAGNOSIS — Z7984 Long term (current) use of oral hypoglycemic drugs: Secondary | ICD-10-CM | POA: Insufficient documentation

## 2019-04-30 DIAGNOSIS — J112 Influenza due to unidentified influenza virus with gastrointestinal manifestations: Secondary | ICD-10-CM | POA: Diagnosis not present

## 2019-04-30 DIAGNOSIS — R509 Fever, unspecified: Secondary | ICD-10-CM | POA: Diagnosis present

## 2019-04-30 LAB — URINALYSIS, ROUTINE W REFLEX MICROSCOPIC
Bacteria, UA: NONE SEEN
Bilirubin Urine: NEGATIVE
Glucose, UA: NEGATIVE mg/dL
Hgb urine dipstick: NEGATIVE
Ketones, ur: NEGATIVE mg/dL
Leukocytes,Ua: NEGATIVE
Nitrite: NEGATIVE
Protein, ur: >=300 mg/dL — AB
Specific Gravity, Urine: 1.016 (ref 1.005–1.030)
pH: 5 (ref 5.0–8.0)

## 2019-04-30 LAB — CBC
HCT: 32.8 % — ABNORMAL LOW (ref 39.0–52.0)
Hemoglobin: 10 g/dL — ABNORMAL LOW (ref 13.0–17.0)
MCH: 26 pg (ref 26.0–34.0)
MCHC: 30.5 g/dL (ref 30.0–36.0)
MCV: 85.4 fL (ref 80.0–100.0)
Platelets: 240 10*3/uL (ref 150–400)
RBC: 3.84 MIL/uL — ABNORMAL LOW (ref 4.22–5.81)
RDW: 17.9 % — ABNORMAL HIGH (ref 11.5–15.5)
WBC: 6.4 10*3/uL (ref 4.0–10.5)
nRBC: 1.1 % — ABNORMAL HIGH (ref 0.0–0.2)

## 2019-04-30 LAB — BASIC METABOLIC PANEL
Anion gap: 10 (ref 5–15)
BUN: 15 mg/dL (ref 8–23)
CO2: 25 mmol/L (ref 22–32)
Calcium: 8.5 mg/dL — ABNORMAL LOW (ref 8.9–10.3)
Chloride: 106 mmol/L (ref 98–111)
Creatinine, Ser: 2.06 mg/dL — ABNORMAL HIGH (ref 0.61–1.24)
GFR calc Af Amer: 37 mL/min — ABNORMAL LOW (ref >60)
GFR calc non Af Amer: 32 mL/min — ABNORMAL LOW (ref >60)
Glucose, Bld: 117 mg/dL — ABNORMAL HIGH (ref 70–99)
Potassium: 3.8 mmol/L (ref 3.5–5.1)
Sodium: 141 mmol/L (ref 135–145)

## 2019-04-30 LAB — CBG MONITORING, ED: Glucose-Capillary: 106 mg/dL — ABNORMAL HIGH (ref 70–99)

## 2019-04-30 MED ORDER — SODIUM CHLORIDE 0.9 % IV BOLUS
1000.0000 mL | Freq: Once | INTRAVENOUS | Status: AC
  Administered 2019-04-30: 1000 mL via INTRAVENOUS

## 2019-04-30 MED ORDER — SODIUM CHLORIDE 0.9% FLUSH: 3.0000 mL | Freq: Once | INTRAVENOUS | Status: DC

## 2019-04-30 NOTE — ED Provider Notes (Signed)
[facility] EMERGENCY DEPARTMENT Provider Note   CSN: [id number] Arrival date & time: 04/30/19  2115   History Chief Complaint  Patient presents with  . Weakness    [name] is a 69 y.o. male.  The history is provided by the patient.  Weakness He has history of hypertension, diabetes, hyperlipidemia, multiple myeloma, chronic kidney disease and comes in because of fever and diarrhea for the last 3 days.  Temperatures been as high as 101.  He has had chills but no sweats.  He has had nausea but no vomiting.  Diarrhea has been occurring 2-3 times a day.  He denies any abdominal pain or urinary symptoms.  He is complaining of feeling generally weak.  Past Medical History:  Diagnosis Date  . Anemia   . 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 Anemia, iron deficiency 03/12/2011  . Anxiety   . Anxiety disorder   . Arthralgia of multiple joints 12/31/2010  . Arthritis   . Constipation   . Diabetes mellitus without complication ([place])   . Diabetic neuropathy ([place])   . Diverticulosis   . Epidermoid cyst of skin of chest 08/29/2018  . Fibromyalgia   . GERD (gastroesophageal reflux disease)   . Headache   . Hemorrhoids   . Herpes zoster   . Hyperlipidemia   . Hypertension   . Hypotestosteronism 03/17/2012  . Multiple myeloma   . Multiple myeloma ([place])   . Myeloma ([place]) 12/31/2010  . Nevus of left elbow 08/29/2018  . Pneumonia   . Renal insufficiency   . Type 2 diabetes mellitus ([place])   . Urinary retention     Patient Active Problem List   Diagnosis Date Noted  . Nevus of left elbow 08/29/2018  . Epidermoid cyst of skin of chest 08/29/2018  . Cervical spondylosis with myelopathy 06/05/2016  . Anemia of chronic renal failure 12/17/2015  . Preventive measure 12/17/2015  . Type 2 diabetes mellitus ([place]) 06/05/2015  . Edema extremities 06/05/2015  . Benign hypertension 06/05/2015  . Abdominal pain in male   . Right sided abdominal pain   . Lower abdominal pain    . Multiple myeloma ([place])   . Acute abdominal pain 11/14/2014  . Generalized abdominal pain   . Esophageal dysphagia   . Hypertensive lower esophageal sphincter   . Dysphagia 09/26/2014  . Rectal bleeding 09/26/2014  . Nausea with vomiting 09/26/2014  . Constipation 09/26/2014  . Hypotestosteronism 03/17/2012  . Can't get food down 01/17/2012  . Hematochezia 07/12/2011  . Anemia, iron deficiency 03/12/2011  . 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 Myeloma ([place]) 12/31/2010  . Diabetes mellitus, type II ([place]) 12/31/2010  . Arthralgia of multiple joints 12/31/2010  . GERD 03/09/2008  . DIVERTICULOSIS-COLON 03/09/2008  . Other dysphagia 03/09/2008    Past Surgical History:  Procedure Laterality Date  . CATARACT EXTRACTION     bi-lateral  . COLONOSCOPY    . COLONOSCOPY  07/13/2011   Procedure: COLONOSCOPY;  Surgeon: Ladene Artist, MD,FACG;  Location: [facility];  Service: Endoscopy;  Laterality: N/A;  . CYST EXCISION Right 08/29/2018   Procedure: CYST REMOVAL;  Surgeon: Fanny Skates, MD;  Location: [place];  Service: General;  Laterality: Right;  . ESOPHAGOGASTRODUODENOSCOPY    . IR CHEST FLUORO  09/22/2016  . IR FLUORO GUIDE PORT INSERTION RIGHT  09/23/2016  . IR REMOVAL TUN ACCESS W/ PORT W/O FL MOD SED  09/23/2016  . IR US GUIDE VASC ACCESS LEFT  09/23/2016  . KNEE ARTHROSCOPY     left  . KNEE SURGERY    . NEVUS EXCISION N/A 08/29/2018   Procedure: , NEVUS LEFT ELBOW;  Surgeon: Fanny Skates, MD;  Location: [place];  Service: General;  Laterality: N/A;  . PORTACATH PLACEMENT    . POSTERIOR CERVICAL FUSION/FORAMINOTOMY N/A 06/05/2016   Procedure: LAMINECTOMY CERVICAL 3 - CERVICAL 6 WITH LATERAL MASS FUSION;  Surgeon: Consuella Lose, MD;  Location: [place];  Service: Neurosurgery;  Laterality: N/A;  LAMINECTOMY CERVICAL 3 - CERVICAL 6 WITH LATERAL MASS FUSION  . TONSILLECTOMY    . TRANSURETHRAL RESECTION OF PROSTATE  01/2011   Dr. at [facility] in [place]       Family History  Problem Relation Age of Onset  . Breast cancer Sister   . Breast cancer Mother   . Heart disease Mother   . Diabetes Sister   . Bone cancer Father   . Heart disease Sister   . Heart disease Daughter   . Throat cancer Brother   . Stomach cancer Brother   . Colon cancer Neg Hx     Social History   Tobacco Use  . Smoking status: Never Smoker  . Smokeless tobacco: Never Used  Substance Use Topics  . Alcohol use: Not Currently    Alcohol/week: 0.0 standard drinks  . Drug use: Not Currently    Comment: nothing in twenty years    Home Medications Prior to Admission medications   Medication Sig Start Date End Date Taking? Authorizing Provider  amLODipine (NORVASC) 5 MG tablet Take 1 tablet by mouth daily. 03/03/19   [provider]  atorvastatin (LIPITOR) 40 MG tablet daily. 08/20/18   [provider]  CARAFATE 1 GM/10ML suspension Take 1 g by mouth 4 (four) times daily.  07/28/11   [provider]  cyclobenzaprine (FLEXERIL) 10 MG tablet Take 10 mg by mouth 3 (three) times daily. 12/06/17   [provider]  dicyclomine (BENTYL) 10 MG capsule TAKE 1 CAPSULE BY MOUTH THREE TIMES A DAY 11/04/18   [place], Holli Humbles, NP  dronabinol (MARINOL) 2.5 MG capsule Take 1 capsule (2.5 mg total) by mouth 2 (two) times daily before a meal. 12/09/18   [place], Rudell Cobb, MD  EMBEDA 20-0.8 MG CPCR Take 1 capsule by mouth 3 (three) times daily.  11/09/17   [provider]  furosemide (LASIX) 40 MG tablet Take 40 mg by mouth daily.  07/01/16   [provider]  gabapentin (NEURONTIN) 300 MG capsule Take one capsule by mouth three times a day. Patient taking differently: daily. Take one capsule by mouth three times a day. 01/27/19   [place], Holli Humbles, NP  lactulose (CHRONULAC) 10 GM/15ML solution Take 30 mLs (20 g total) by mouth daily. 02/02/19   Volanda Napoleon, MD  latanoprost (XALATAN) 0.005 % ophthalmic  solution Place 1 drop into the right eye every evening.     [provider]  lidocaine-prilocaine (EMLA) cream Apply 1 application topically as needed. 10/05/16   Volanda Napoleon, MD  LORazepam (ATIVAN) 0.5 MG tablet Take 1 tablet (0.5 mg total) by mouth every 6 (six) hours as needed (Nausea or vomiting). 08/23/17   [place], Holli Humbles, NP  losartan (COZAAR) 50 MG tablet Take 50 mg by mouth daily. 02/25/19   [provider]  mesalamine (LIALDA) 1.2 g EC tablet Take 1 tablet (1.2 g total) by mouth daily with breakfast. 07/04/18   [place], Holli Humbles,  NP  metoprolol succinate (TOPROL-XL) 100 MG 24 hr tablet Take 100 mg by mouth daily.  12/14/17   [provider]  mirabegron ER (MYRBETRIQ) 50 MG TB24 tablet Take 50 mg by mouth daily.    [provider]  morphine (MS CONTIN) 30 MG 12 hr tablet Take 30 mg by mouth 2 (two) times daily. 06/29/18   [provider]  Morphine-Naltrexone (EMBEDA) 30-1.2 MG CPCR Take 3 (three) times daily by mouth.    [provider]  omeprazole (PRILOSEC) 40 MG capsule TAKE 1 CAPSULE BY MOUTH TWICE A DAY 04/19/19   Volanda Napoleon, MD  ondansetron (ZOFRAN) 8 MG tablet TAKE 1 TABLET(8 MG) BY MOUTH TWICE DAILY AS NEEDED FOR NAUSEA OR VOMITING 02/27/19   Volanda Napoleon, MD  pioglitazone (ACTOS) 30 MG tablet Take 30 mg by mouth daily. 03/06/18   [provider]  Polyvinyl Alcohol-Povidone (CLEAR EYES ALL SEASONS) 5-6 MG/ML SOLN Place 2 drops into both eyes 3 (three) times daily as needed (drye eyes.).     [provider]  potassium chloride SA (K-DUR,KLOR-CON) 20 MEQ tablet Take 1 tablet (20 mEq total) by mouth 2 (two) times daily. 11/07/14   Volanda Napoleon, MD  prochlorperazine (COMPAZINE) 10 MG tablet take 1 tablet by mouth every 6 hours if needed for nausea and vomiting 02/27/19   Volanda Napoleon, MD  RELISTOR 150 MG TABS Take 150 mg by mouth. 03/22/18   [provider]  Sennosides 25 MG TABS Take 25 mg by  mouth daily as needed (constipation).     [provider]  Tafluprost, PF, (ZIOPTAN) 0.0015 % SOLN Place 1 drop into both eyes daily.     [provider]  topiramate (TOPAMAX) 25 MG tablet Take 25 mg by mouth daily. 03/13/19   [provider]  torsemide (DEMADEX) 20 MG tablet Take 20 mg by mouth. Take two tablets total of 40 mg by mouth daily. 01/12/19   [provider]  TRULICITY 9.41 DE/0.8XK SOPN SMARTSIG:1 Pre-Filled Pen Syringe SUB-Q Once a Week 04/19/19   [provider]  Vitamin D, Ergocalciferol, (DRISDOL) 50000 UNITS CAPS capsule Take 50,000 Units by mouth every Saturday.     [provider]  potassium chloride (KLOR-CON) 20 MEQ packet Take 20 mEq by mouth daily.   05/17/11  [provider]    Allergies    Iodinated diagnostic agents and Iodine  Review of Systems   Review of Systems  Neurological: Positive for weakness.  All other systems reviewed and are negative.   Physical Exam Updated Vital Signs BP 139/72 (BP Location: Right Arm)   Pulse (!) 110   Temp 99.9 F (37.7 C) (Oral)   Resp 18   SpO2 100%   Physical Exam Vitals and nursing note reviewed.   69 year old male, resting comfortably and in no acute distress. Vital signs are significant for elevated heart rate. Oxygen saturation is 100%, which is normal. Head is normocephalic and atraumatic. PERRLA, EOMI. Oropharynx is clear. Neck is nontender and supple without adenopathy or JVD. Back is nontender and there is no CVA tenderness. Lungs are clear without rales, wheezes, or rhonchi. Chest is nontender. Heart has regular rate and rhythm without murmur. Abdomen is soft, flat, nontender without masses or hepatosplenomegaly and peristalsis is normoactive. Extremities have no cyanosis or edema, full range of motion is present. Skin is warm and dry without rash. Neurologic: Mental status is normal, cranial nerves are intact, there are no motor  or sensory  deficits.  ED Results / Procedures / Treatments   Labs (all labs ordered are listed, but only abnormal results are displayed) Labs Reviewed  BASIC METABOLIC PANEL - Abnormal; Notable for the following components:      Result Value   Glucose, Bld 117 (*)    Creatinine, Ser 2.06 (*)    Calcium 8.5 (*)    GFR calc non Af Amer 32 (*)    GFR calc Af Amer 37 (*)    All other components within normal limits  CBC - Abnormal; Notable for the following components:   RBC 3.84 (*)    Hemoglobin 10.0 (*)    HCT 32.8 (*)    RDW 17.9 (*)    nRBC 1.1 (*)    All other components within normal limits  URINALYSIS, ROUTINE W REFLEX MICROSCOPIC - Abnormal; Notable for the following components:   Protein, ur >=300 (*)    All other components within normal limits  CBG MONITORING, ED - Abnormal; Notable for the following components:   Glucose-Capillary 106 (*)    All other components within normal limits  HEPATIC FUNCTION PANEL  MAGNESIUM    EKG EKG Interpretation  Date/Time:  Sunday April 30 2019 21:52:52 EST Ventricular Rate:  109 PR Interval:  144 QRS Duration: 88 QT Interval:  330 QTC Calculation: 444 R Axis:   83 Text Interpretation: Sinus tachycardia Nonspecific T wave abnormality Abnormal ECG When compared with ECG of 08/25/2018, No significant change was found Confirmed by Delora Fuel ([id number]) on 04/30/2019 10:48:34 PM  Procedures Procedures   Medications Ordered in ED Medications  sodium chloride flush (NS) 0.9 % injection 3 mL (has no administration in time range)    ED Course  I have reviewed the triage vital signs and the nursing notes.  Pertinent labs & imaging results that were available during my care of the patient were reviewed by me and considered in my medical decision making (see chart for details).  MDM Rules/Calculators/A&P Fever, nausea, diarrhea and patient with multiple myeloma.  Hemoglobin is 10.0 which is about at his baseline.  WBC is 6.4.  He does not appear  toxic.  Will give IV fluids and check metabolic panel and hepatic function panel.  Old records are reviewed confirming he is getting chemotherapy for multiple myeloma with last chemotherapy being given March 3.  He also had COVID-19 diagnosed on February 8.  Urinalysis shows no evidence of infection.  Basic metabolic panel shows renal insufficiency which is unchanged from baseline.  Hepatic function panel is normal.  He now tells me that he is aching all of his joints.  Overall picture is suggestive of viral illness such as influenza.  He is advised to take over-the-counter analgesics as needed for pain and to drink plenty of fluids.  Return if symptoms are worsening.  Final Clinical Impression(s) / ED Diagnoses Final diagnoses:  Influenza-like illness  Renal insufficiency  Normochromic normocytic anemia    Rx / DC Orders ED Discharge Orders    None       Delora Fuel, MD 83/46/21 [phone]

## 2019-04-30 NOTE — ED Triage Notes (Signed)
Pt reports generalized weakness, muscle/joint pain, general feeling poorly.  Pt states he is getting cancer treatment, started after the next day.

## 2019-05-01 ENCOUNTER — Other Ambulatory Visit: Payer: Medicare Other

## 2019-05-01 ENCOUNTER — Ambulatory Visit: Payer: Medicare Other | Admitting: Hematology & Oncology

## 2019-05-01 ENCOUNTER — Ambulatory Visit: Payer: Medicare Other

## 2019-05-01 DIAGNOSIS — J112 Influenza due to unidentified influenza virus with gastrointestinal manifestations: Secondary | ICD-10-CM | POA: Diagnosis not present

## 2019-05-01 LAB — HEPATIC FUNCTION PANEL
ALT: 24 U/L (ref 0–44)
AST: 21 U/L (ref 15–41)
Albumin: 3.6 g/dL (ref 3.5–5.0)
Alkaline Phosphatase: 85 U/L (ref 38–126)
Bilirubin, Direct: <0.1 mg/dL (ref 0.0–0.2)
Total Bilirubin: 0.5 mg/dL (ref 0.3–1.2)
Total Protein: 7 g/dL (ref 6.5–8.1)

## 2019-05-01 LAB — MAGNESIUM: Magnesium: 2 mg/dL (ref 1.7–2.4)

## 2019-05-01 MED ORDER — OXYCODONE-ACETAMINOPHEN 5-325 MG PO TABS
2.0000 | ORAL_TABLET | Freq: Once | ORAL | Status: AC
  Administered 2019-05-01: 2 via ORAL
  Filled 2019-05-01: qty 2

## 2019-05-01 NOTE — ED Notes (Signed)
Lab will add on hepatic labs

## 2019-05-01 NOTE — Discharge Instructions (Signed)
Rest.  Drink plenty of fluids.  Take ibuprofen or naproxen as needed for aching.  Return if symptoms are getting worse.

## 2019-05-17 ENCOUNTER — Inpatient Hospital Stay: Payer: Medicare Other

## 2019-05-17 ENCOUNTER — Telehealth: Payer: Self-pay | Admitting: Hematology & Oncology

## 2019-05-17 ENCOUNTER — Ambulatory Visit (HOSPITAL_BASED_OUTPATIENT_CLINIC_OR_DEPARTMENT_OTHER)
Admission: RE | Admit: 2019-05-17 | Discharge: 2019-05-17 | Disposition: A | Discharge status: Home / Self Care | Payer: Medicare Other | Source: Ambulatory Visit | Attending: Hematology & Oncology | Admitting: Hematology & Oncology

## 2019-05-17 ENCOUNTER — Inpatient Hospital Stay (HOSPITAL_BASED_OUTPATIENT_CLINIC_OR_DEPARTMENT_OTHER): Payer: Medicare Other | Admitting: Hematology & Oncology

## 2019-05-17 ENCOUNTER — Other Ambulatory Visit: Payer: Self-pay

## 2019-05-17 ENCOUNTER — Other Ambulatory Visit: Payer: Self-pay | Admitting: *Deleted

## 2019-05-17 VITALS — BP 166/75 | HR 96 | Temp 97.7°F | Resp 17 | Wt 184.0 lb

## 2019-05-17 VITALS — Wt 192.0 lb

## 2019-05-17 DIAGNOSIS — Z299 Encounter for prophylactic measures, unspecified: Secondary | ICD-10-CM

## 2019-05-17 DIAGNOSIS — C9000 Multiple myeloma not having achieved remission: Secondary | ICD-10-CM

## 2019-05-17 DIAGNOSIS — D500 Iron deficiency anemia secondary to blood loss (chronic): Secondary | ICD-10-CM

## 2019-05-17 DIAGNOSIS — M2550 Pain in unspecified joint: Secondary | ICD-10-CM | POA: Diagnosis not present

## 2019-05-17 DIAGNOSIS — E349 Endocrine disorder, unspecified: Secondary | ICD-10-CM

## 2019-05-17 DIAGNOSIS — D631 Anemia in chronic kidney disease: Secondary | ICD-10-CM

## 2019-05-17 DIAGNOSIS — D508 Other iron deficiency anemias: Secondary | ICD-10-CM

## 2019-05-17 DIAGNOSIS — R0602 Shortness of breath: Secondary | ICD-10-CM | POA: Insufficient documentation

## 2019-05-17 DIAGNOSIS — Z5112 Encounter for antineoplastic immunotherapy: Secondary | ICD-10-CM | POA: Diagnosis not present

## 2019-05-17 DIAGNOSIS — G629 Polyneuropathy, unspecified: Secondary | ICD-10-CM

## 2019-05-17 LAB — CMP (CANCER CENTER ONLY)
ALT: 12 U/L (ref 0–44)
AST: 17 U/L (ref 15–41)
Albumin: 4.8 g/dL (ref 3.5–5.0)
Alkaline Phosphatase: 77 U/L (ref 38–126)
Anion gap: 12 (ref 5–15)
BUN: 40 mg/dL — ABNORMAL HIGH (ref 8–23)
CO2: 27 mmol/L (ref 22–32)
Calcium: 9.6 mg/dL (ref 8.9–10.3)
Chloride: 102 mmol/L (ref 98–111)
Creatinine: 2.82 mg/dL — ABNORMAL HIGH (ref 0.61–1.24)
GFR, Est AFR Am: 25 mL/min — ABNORMAL LOW (ref >60)
GFR, Estimated: 22 mL/min — ABNORMAL LOW (ref >60)
Glucose, Bld: 148 mg/dL — ABNORMAL HIGH (ref 70–99)
Potassium: 3.5 mmol/L (ref 3.5–5.1)
Sodium: 141 mmol/L (ref 135–145)
Total Bilirubin: 0.4 mg/dL (ref 0.3–1.2)
Total Protein: 8.1 g/dL (ref 6.5–8.1)

## 2019-05-17 LAB — IRON AND TIBC
Iron: 97 ug/dL (ref 42–163)
Saturation Ratios: 33 % (ref 20–55)
TIBC: 293 ug/dL (ref 202–409)
UIBC: 196 ug/dL (ref 117–376)

## 2019-05-17 LAB — CBC WITH DIFFERENTIAL (CANCER CENTER ONLY)
Abs Immature Granulocytes: 0.03 10*3/uL (ref 0.00–0.07)
Basophils Absolute: 0 10*3/uL (ref 0.0–0.1)
Basophils Relative: 0 %
Eosinophils Absolute: 0 10*3/uL (ref 0.0–0.5)
Eosinophils Relative: 0 %
HCT: 33.9 % — ABNORMAL LOW (ref 39.0–52.0)
Hemoglobin: 10.7 g/dL — ABNORMAL LOW (ref 13.0–17.0)
Immature Granulocytes: 1 %
Lymphocytes Relative: 10 %
Lymphs Abs: 0.6 10*3/uL — ABNORMAL LOW (ref 0.7–4.0)
MCH: 25.7 pg — ABNORMAL LOW (ref 26.0–34.0)
MCHC: 31.6 g/dL (ref 30.0–36.0)
MCV: 81.3 fL (ref 80.0–100.0)
Monocytes Absolute: 0.5 10*3/uL (ref 0.1–1.0)
Monocytes Relative: 9 %
Neutro Abs: 5.1 10*3/uL (ref 1.7–7.7)
Neutrophils Relative %: 80 %
Platelet Count: 218 10*3/uL (ref 150–400)
RBC: 4.17 MIL/uL — ABNORMAL LOW (ref 4.22–5.81)
RDW: 17.5 % — ABNORMAL HIGH (ref 11.5–15.5)
WBC Count: 6.3 10*3/uL (ref 4.0–10.5)
nRBC: 0 % (ref 0.0–0.2)

## 2019-05-17 LAB — FERRITIN: Ferritin: 2675 ng/mL — ABNORMAL HIGH (ref 24–336)

## 2019-05-17 MED ORDER — TESTOSTERONE CYPIONATE 200 MG/ML IM SOLN
400.0000 mg | Freq: Once | INTRAMUSCULAR | Status: AC
  Administered 2019-05-17: 400 mg via INTRAMUSCULAR

## 2019-05-17 MED ORDER — DEXAMETHASONE SODIUM PHOSPHATE 10 MG/ML IJ SOLN
INTRAMUSCULAR | Status: AC
  Filled 2019-05-17: qty 1

## 2019-05-17 MED ORDER — SODIUM CHLORIDE 0.9 % IV SOLN
Freq: Once | INTRAVENOUS | Status: AC
  Filled 2019-05-17: qty 250

## 2019-05-17 MED ORDER — HYDROMORPHONE HCL 1 MG/ML IJ SOLN
INTRAMUSCULAR | Status: AC
  Filled 2019-05-17: qty 2

## 2019-05-17 MED ORDER — PALONOSETRON HCL INJECTION 0.25 MG/5ML
INTRAVENOUS | Status: AC
  Filled 2019-05-17: qty 5

## 2019-05-17 MED ORDER — PALONOSETRON HCL INJECTION 0.25 MG/5ML
0.2500 mg | Freq: Once | INTRAVENOUS | Status: AC
  Administered 2019-05-17: 0.25 mg via INTRAVENOUS

## 2019-05-17 MED ORDER — DEXTROSE 5 % IV SOLN
28.6000 mg/m2 | Freq: Once | INTRAVENOUS | Status: AC
  Administered 2019-05-17: 60 mg via INTRAVENOUS
  Filled 2019-05-17: qty 30

## 2019-05-17 MED ORDER — SODIUM CHLORIDE 0.9% FLUSH
10.0000 mL | INTRAVENOUS | Status: DC | PRN
  Administered 2019-05-17: 10 mL
  Filled 2019-05-17: qty 10

## 2019-05-17 MED ORDER — HEPARIN SOD (PORK) LOCK FLUSH 100 UNIT/ML IV SOLN
500.0000 [IU] | Freq: Once | INTRAVENOUS | Status: AC | PRN
  Administered 2019-05-17: 500 [IU]
  Filled 2019-05-17: qty 5

## 2019-05-17 MED ORDER — TESTOSTERONE CYPIONATE 200 MG/ML IM SOLN
INTRAMUSCULAR | Status: AC
  Filled 2019-05-17: qty 2

## 2019-05-17 MED ORDER — HYDROMORPHONE HCL 1 MG/ML IJ SOLN
2.0000 mg | Freq: Once | INTRAMUSCULAR | Status: AC
  Administered 2019-05-17: 2 mg via INTRAVENOUS

## 2019-05-17 MED ORDER — SODIUM CHLORIDE 0.9 % IV SOLN
300.0000 mg/m2 | Freq: Once | INTRAVENOUS | Status: AC
  Administered 2019-05-17: 620 mg via INTRAVENOUS
  Filled 2019-05-17: qty 31

## 2019-05-17 MED ORDER — DEXAMETHASONE SODIUM PHOSPHATE 10 MG/ML IJ SOLN
10.0000 mg | Freq: Once | INTRAMUSCULAR | Status: AC
  Administered 2019-05-17: 10 mg via INTRAVENOUS

## 2019-05-17 NOTE — Telephone Encounter (Signed)
Called and advised patient of appointments added per 3/24 los. 

## 2019-05-17 NOTE — Patient Instructions (Signed)
Testosterone injection What is this medicine? TESTOSTERONE (tes TOS ter one) is the main male hormone. It supports normal male development such as muscle growth, facial hair, and deep voice. It is used in males to treat low testosterone levels. This medicine may be used for other purposes; ask your health care provider or pharmacist if you have questions. COMMON BRAND NAME(S): Andro-L.A., Aveed, Delatestryl, Depo-Testosterone, Virilon What should I tell my health care provider before I take this medicine? They need to know if you have any of these conditions:  cancer  diabetes  heart disease  kidney disease  liver disease  lung disease  prostate disease  an unusual or allergic reaction to testosterone, other medicines, foods, dyes, or preservatives  pregnant or trying to get pregnant  breast-feeding How should I use this medicine? This medicine is for injection into a muscle. It is usually given by a health care professional in a hospital or clinic setting. Contact your pediatrician regarding the use of this medicine in children. While this medicine may be prescribed for children as young as 54 years of age for selected conditions, precautions do apply. Overdosage: If you think you have taken too much of this medicine contact a poison control center or emergency room at once. NOTE: This medicine is only for you. Do not share this medicine with others. What if I miss a dose? Try not to miss a dose. Your doctor or health care professional will tell you when your next injection is due. Notify the office if you are unable to keep an appointment. What may interact with this medicine?  medicines for diabetes  medicines that treat or prevent blood clots like warfarin  oxyphenbutazone  propranolol  steroid medicines like prednisone or cortisone This list may not describe all possible interactions. Give your health care provider a list of all the medicines, herbs, non-prescription  drugs, or dietary supplements you use. Also tell them if you smoke, drink alcohol, or use illegal drugs. Some items may interact with your medicine. What should I watch for while using this medicine? Visit your doctor or health care professional for regular checks on your progress. They will need to check the level of testosterone in your blood. This medicine is only approved for use in men who have low levels of testosterone related to certain medical conditions. Heart attacks and strokes have been reported with the use of this medicine. Notify your doctor or health care professional and seek emergency treatment if you develop breathing problems; changes in vision; confusion; chest pain or chest tightness; sudden arm pain; severe, sudden headache; trouble speaking or understanding; sudden numbness or weakness of the face, arm or leg; loss of balance or coordination. Talk to your doctor about the risks and benefits of this medicine. This medicine may affect blood sugar levels. If you have diabetes, check with your doctor or health care professional before you change your diet or the dose of your diabetic medicine. Testosterone injections are not commonly used in women. Women should inform their doctor if they wish to become pregnant or think they might be pregnant. There is a potential for serious side effects to an unborn child. Talk to your health care professional or pharmacist for more information. Talk with your doctor or health care professional about your birth control options while taking this medicine. This drug is banned from use in athletes by most athletic organizations. What side effects may I notice from receiving this medicine? Side effects that you should report to  your doctor or health care professional as soon as possible:  allergic reactions like skin rash, itching or hives, swelling of the face, lips, or tongue  breast enlargement  breathing problems  changes in emotions or  moods  deep or hoarse voice  irregular menstrual periods  signs and symptoms of liver injury like dark yellow or brown urine; general ill feeling or flu-like symptoms; light-colored stools; loss of appetite; nausea; right upper belly pain; unusually weak or tired; yellowing of the eyes or skin  stomach pain  swelling of the ankles, feet, hands  too frequent or persistent erections  trouble passing urine or change in the amount of urine Side effects that usually do not require medical attention (report to your doctor or health care professional if they continue or are bothersome):  acne  change in sex drive or performance  facial hair growth  hair loss  headache This list may not describe all possible side effects. Call your doctor for medical advice about side effects. You may report side effects to FDA at 1-800-FDA-1088. Where should I keep my medicine? Keep out of the reach of children. This medicine can be abused. Keep your medicine in a safe place to protect it from theft. Do not share this medicine with anyone. Selling or giving away this medicine is dangerous and against the law. Store at room temperature between 20 and 25 degrees C (68 and 77 degrees F). Do not freeze. Protect from light. Follow the directions for the product you are prescribed. Throw away any unused medicine after the expiration date. NOTE: This sheet is a summary. It may not cover all possible information. If you have questions about this medicine, talk to your doctor, pharmacist, or health care provider.  2020 Elsevier/Gold Standard (2015-03-16 07:33:55) Carfilzomib injection What is this medicine? CARFILZOMIB (kar FILZ oh mib) targets a specific protein within cancer cells and stops the cancer cells from growing. It is used to treat multiple myeloma. This medicine may be used for other purposes; ask your health care provider or pharmacist if you have questions. COMMON BRAND NAME(S): KYPROLIS What  should I tell my health care provider before I take this medicine? They need to know if you have any of these conditions:  heart disease  history of blood clots  irregular heartbeat  kidney disease  liver disease  lung or breathing disease  an unusual or allergic reaction to carfilzomib, or other medicines, foods, dyes, or preservatives  pregnant or trying to get pregnant  breast-feeding How should I use this medicine? This medicine is for injection or infusion into a vein. It is given by a health care professional in a hospital or clinic setting. Talk to your pediatrician regarding the use of this medicine in children. Special care may be needed. Overdosage: If you think you have taken too much of this medicine contact a poison control center or emergency room at once. NOTE: This medicine is only for you. Do not share this medicine with others. What if I miss a dose? It is important not to miss your dose. Call your doctor or health care professional if you are unable to keep an appointment. What may interact with this medicine? Interactions are not expected. Give your health care provider a list of all the medicines, herbs, non-prescription drugs, or dietary supplements you use. Also tell them if you smoke, drink alcohol, or use illegal drugs. Some items may interact with your medicine. This list may not describe all possible interactions. Give  your health care provider a list of all the medicines, herbs, non-prescription drugs, or dietary supplements you use. Also tell them if you smoke, drink alcohol, or use illegal drugs. Some items may interact with your medicine. What should I watch for while using this medicine? Your condition will be monitored carefully while you are receiving this medicine. Report any side effects. Continue your course of treatment even though you feel ill unless your doctor tells you to stop. You may need blood work done while you are taking this  medicine. Do not become pregnant while taking this medicine or for at least 6 months after stopping it. Women should inform their doctor if they wish to become pregnant or think they might be pregnant. There is a potential for serious side effects to an unborn child. Men should not father a child while taking this medicine and for at least 3 months after stopping it. Talk to your health care professional or pharmacist for more information. Do not breast-feed an infant while taking this medicine or for 2 weeks after the last dose. Check with your doctor or health care professional if you get an attack of severe diarrhea, nausea and vomiting, or if you sweat a lot. The loss of too much body fluid can make it dangerous for you to take this medicine. You may get dizzy. Do not drive, use machinery, or do anything that needs mental alertness until you know how this medicine affects you. Do not stand or sit up quickly, especially if you are an older patient. This reduces the risk of dizzy or fainting spells. What side effects may I notice from receiving this medicine? Side effects that you should report to your doctor or health care professional as soon as possible:  allergic reactions like skin rash, itching or hives, swelling of the face, lips, or tongue  confusion  dizziness  feeling faint or lightheaded  fever or chills  palpitations  seizures  signs and symptoms of bleeding such as bloody or black, tarry stools; red or dark-brown urine; spitting up blood or brown material that looks like coffee grounds; red spots on the skin; unusual bruising or bleeding including from the eye, gums, or nose  signs and symptoms of a blood clot such as breathing problems; changes in vision; chest pain; severe, sudden headache; pain, swelling, warmth in the leg; trouble speaking; sudden numbness or weakness of the face, arm or leg  signs and symptoms of kidney injury like trouble passing urine or change in the  amount of urine  signs and symptoms of liver injury like dark yellow or brown urine; general ill feeling or flu-like symptoms; light-colored stools; loss of appetite; nausea; right upper belly pain; unusually weak or tired; yellowing of the eyes or skin Side effects that usually do not require medical attention (report to your doctor or health care professional if they continue or are bothersome):  back pain  cough  diarrhea  headache  muscle cramps  trouble sleeping  vomiting This list may not describe all possible side effects. Call your doctor for medical advice about side effects. You may report side effects to FDA at 1-800-FDA-1088. Where should I keep my medicine? This drug is given in a hospital or clinic and will not be stored at home. NOTE: This sheet is a summary. It may not cover all possible information. If you have questions about this medicine, talk to your doctor, pharmacist, or health care provider.  2020 [employer]/Gold Standard (2018-10-17 19:44:21) Cyclophosphamide Injection  What is this medicine? CYCLOPHOSPHAMIDE (sye kloe FOSS fa mide) is a chemotherapy drug. It slows the growth of cancer cells. This medicine is used to treat many types of cancer like lymphoma, myeloma, leukemia, breast cancer, and ovarian cancer, to name a few. This medicine may be used for other purposes; ask your health care provider or pharmacist if you have questions. COMMON BRAND NAME(S): Cytoxan, Neosar What should I tell my health care provider before I take this medicine? They need to know if you have any of these conditions:  heart disease  history of irregular heartbeat  infection  kidney disease  liver disease  low blood counts, like white cells, platelets, or red blood cells  on hemodialysis  recent or ongoing radiation therapy  scarring or thickening of the lungs  trouble passing urine  an unusual or allergic reaction to cyclophosphamide, other medicines, foods,  dyes, or preservatives  pregnant or trying to get pregnant  breast-feeding How should I use this medicine? This drug is usually given as an injection into a vein or muscle or by infusion into a vein. It is administered in a hospital or clinic by a specially trained health care professional. Talk to your pediatrician regarding the use of this medicine in children. Special care may be needed. Overdosage: If you think you have taken too much of this medicine contact a poison control center or emergency room at once. NOTE: This medicine is only for you. Do not share this medicine with others. What if I miss a dose? It is important not to miss your dose. Call your doctor or health care professional if you are unable to keep an appointment. What may interact with this medicine?  amphotericin B  azathioprine  certain antivirals for HIV or hepatitis  certain medicines for blood pressure, heart disease, irregular heart beat  certain medicines that treat or prevent blood clots like warfarin  certain other medicines for cancer  cyclosporine  etanercept  indomethacin  medicines that relax muscles for surgery  medicines to increase blood counts  metronidazole This list may not describe all possible interactions. Give your health care provider a list of all the medicines, herbs, non-prescription drugs, or dietary supplements you use. Also tell them if you smoke, drink alcohol, or use illegal drugs. Some items may interact with your medicine. What should I watch for while using this medicine? Your condition will be monitored carefully while you are receiving this medicine. You may need blood work done while you are taking this medicine. Drink water or other fluids as directed. Urinate often, even at night. Some products may contain alcohol. Ask your health care professional if this medicine contains alcohol. Be sure to tell all health care professionals you are taking this medicine.  Certain medicines, like metronidazole and disulfiram, can cause an unpleasant reaction when taken with alcohol. The reaction includes flushing, headache, nausea, vomiting, sweating, and increased thirst. The reaction can last from 30 minutes to several hours. Do not become pregnant while taking this medicine or for 1 year after stopping it. Women should inform their health care professional if they wish to become pregnant or think they might be pregnant. Men should not father a child while taking this medicine and for 4 months after stopping it. There is potential for serious side effects to an unborn child. Talk to your health care professional for more information. Do not breast-feed an infant while taking this medicine or for 1 week after stopping it. This medicine has caused  ovarian failure in some women. This medicine may make it more difficult to get pregnant. Talk to your health care professional if you are concerned about your fertility. This medicine has caused decreased sperm counts in some men. This may make it more difficult to father a child. Talk to your health care professional if you are concerned about your fertility. Call your health care professional for advice if you get a fever, chills, or sore throat, or other symptoms of a cold or flu. Do not treat yourself. This medicine decreases your body's ability to fight infections. Try to avoid being around people who are sick. Avoid taking medicines that contain aspirin, acetaminophen, ibuprofen, naproxen, or ketoprofen unless instructed by your health care professional. These medicines may hide a fever. Talk to your health care professional about your risk of cancer. You may be more at risk for certain types of cancer if you take this medicine. If you are going to need surgery or other procedure, tell your health care professional that you are using this medicine. Be careful brushing or flossing your teeth or using a toothpick because you may  get an infection or bleed more easily. If you have any dental work done, tell your dentist you are receiving this medicine. What side effects may I notice from receiving this medicine? Side effects that you should report to your doctor or health care professional as soon as possible:  allergic reactions like skin rash, itching or hives, swelling of the face, lips, or tongue  breathing problems  nausea, vomiting  signs and symptoms of bleeding such as bloody or black, tarry stools; red or dark brown urine; spitting up blood or brown material that looks like coffee grounds; red spots on the skin; unusual bruising or bleeding from the eyes, gums, or nose  signs and symptoms of heart failure like fast, irregular heartbeat, sudden weight gain; swelling of the ankles, feet, hands  signs and symptoms of infection like fever; chills; cough; sore throat; pain or trouble passing urine  signs and symptoms of kidney injury like trouble passing urine or change in the amount of urine  signs and symptoms of liver injury like dark yellow or brown urine; general ill feeling or flu-like symptoms; light-colored stools; loss of appetite; nausea; right upper belly pain; unusually weak or tired; yellowing of the eyes or skin Side effects that usually do not require medical attention (report to your doctor or health care professional if they continue or are bothersome):  confusion  decreased hearing  diarrhea  facial flushing  hair loss  headache  loss of appetite  missed menstrual periods  signs and symptoms of low red blood cells or anemia such as unusually weak or tired; feeling faint or lightheaded; falls  skin discoloration This list may not describe all possible side effects. Call your doctor for medical advice about side effects. You may report side effects to FDA at 1-800-FDA-1088. Where should I keep my medicine? This drug is given in a hospital or clinic and will not be stored at  home. NOTE: This sheet is a summary. It may not cover all possible information. If you have questions about this medicine, talk to your doctor, pharmacist, or health care provider.  2020 [employer]vier/Gold Standard (2018-11-14 09:53:29)

## 2019-05-17 NOTE — Progress Notes (Signed)
Ok to treat with creatinine of 2.82 per DR [name]. dph

## 2019-05-17 NOTE — Progress Notes (Signed)
Hematology and Oncology Follow Up Visit  [name] [id number] 08/03/1950 69 y.o. 05/17/2019   Principle Diagnosis:  IgG kappa myeloma  Anemia secondary to renal insufficiency  Intermittent iron - deficiency anemia  Hypotestosteronemia  Current Therapy:   Aredia 60 mg IV q 3 months-- next dose06/2021 Aranesp 300 mcg subcu as needed for hemoglobin less than 11 DepoTestosterone400 mg q4weeks IV iron as indicated Kyprolis/Cytoxan - s/p cycle23   Interim History:  [name] is here today for follow-up and treatment.  Unfortunately, I think he is still suffering from having the coronavirus.  He just does not feel well.  He does not have a lot of energy.  He is not eating much.  His weight is going down.  I do not think there is any issues with diarrhea.  He does have the diabetes.  I think this probably might be a little bit better controlled.  Again, I do think anything is related to him having myeloma.  His last myeloma studies show that he was in remission.  His monoclonal spike was not found.  His IgG level was 1170 mg/dL.  His kappa light chain was up a little bit at 6.9 mg/dL.  He says he is little bit short of breath.  We will go ahead and get a chest x-ray on him.  He has had a little bit of abdominal distention.  He has had no vomiting.  He has had a little bit of nausea.  His last testosterone level back in early March was 385.  I am not sure exactly what is going on I saw the fact that he is still recovering from the coronavirus.  I told him that it might take a year to to recover from the coronavirus infection that he had.  I would have to say that his performance status right now is probably ECOG 1-2.    Medications:  Allergies as of 05/17/2019      Reactions   Iodinated Diagnostic Agents Rash   Patient states he was instructed not to take IV contrast.  In 2008 he had an unknown reaction, and was told not to take it again.  He was also told not to take it  due to his kidneys.   Iodine Anxiety, Rash, Other (See Comments)   Didn't feel right "instructed not to take per MD--something with his port"      Medication List       Accurate as of May 17, 2019  8:32 AM. If you have any questions, ask your nurse or doctor.        amLODipine 5 MG tablet Commonly known as: NORVASC Take 5 mg by mouth daily.   atorvastatin 40 MG tablet Commonly known as: LIPITOR Take 40 mg by mouth daily at 6 PM.   Carafate 1 GM/10ML suspension Generic drug: sucralfate Take 1 g by mouth 4 (four) times daily.   dicyclomine 10 MG capsule Commonly known as: BENTYL TAKE 1 CAPSULE BY MOUTH THREE TIMES A DAY What changed: See the new instructions.   dronabinol 2.5 MG capsule Commonly known as: MARINOL Take 1 capsule (2.5 mg total) by mouth 2 (two) times daily before a meal.   gabapentin 300 MG capsule Commonly known as: NEURONTIN Take one capsule by mouth three times a day. What changed:   how much to take  how to take this  when to take this  additional instructions   glimepiride 2 MG tablet Commonly known as: AMARYL Take 2 mg by  mouth daily with breakfast.   lactulose 10 GM/15ML solution Commonly known as: CHRONULAC Take 30 mLs (20 g total) by mouth daily.   losartan 50 MG tablet Commonly known as: COZAAR Take 50 mg by mouth daily.   metFORMIN 500 MG tablet Commonly known as: GLUCOPHAGE Take 500 mg by mouth 2 (two) times daily with a meal.   mirabegron ER 50 MG Tb24 tablet Commonly known as: MYRBETRIQ Take 50 mg by mouth daily.   morphine 30 MG 12 hr tablet Commonly known as: MS CONTIN Take 30 mg by mouth 2 (two) times daily.   omeprazole 40 MG capsule Commonly known as: PRILOSEC TAKE 1 CAPSULE BY MOUTH TWICE A DAY What changed: when to take this   ondansetron 8 MG tablet Commonly known as: ZOFRAN TAKE 1 TABLET(8 MG) BY MOUTH TWICE DAILY AS NEEDED FOR NAUSEA OR VOMITING What changed:   how much to take  how to take  this  when to take this  reasons to take this  additional instructions   oxyCODONE-acetaminophen 10-325 MG tablet Commonly known as: PERCOCET Take 1 tablet by mouth every 6 (six) hours as needed for pain.   pioglitazone 30 MG tablet Commonly known as: ACTOS Take 30 mg by mouth daily.   prochlorperazine 10 MG tablet Commonly known as: COMPAZINE take 1 tablet by mouth every 6 hours if needed for nausea and vomiting   topiramate 25 MG tablet Commonly known as: TOPAMAX Take 25 mg by mouth daily.   torsemide 20 MG tablet Commonly known as: DEMADEX Take 40 mg by mouth daily.   Trulicity 6.01 UX/3.2TF Sopn Generic drug: Dulaglutide Inject 0.75 mg into the skin every Saturday.   Vitamin D (Ergocalciferol) 1.25 MG (50000 UNIT) Caps capsule Commonly known as: DRISDOL Take 50,000 Units by mouth every Saturday.       Allergies:  Allergies  Allergen Reactions  . Iodinated Diagnostic Agents Rash    Patient states he was instructed not to take IV contrast.  In 2008 he had an unknown reaction, and was told not to take it again.  He was also told not to take it due to his kidneys.  . Iodine Anxiety, Rash and Other (See Comments)    Didn't feel right "instructed not to take per MD--something with his port"     Past Medical History, Surgical history, Social history, and Family History were reviewed and updated.  Review of Systems: Review of Systems  Constitutional: Positive for malaise/fatigue and weight loss.  Respiratory: Positive for shortness of breath.   Cardiovascular: Positive for palpitations.  Gastrointestinal: Positive for diarrhea, heartburn and vomiting.  Genitourinary: Negative.   Musculoskeletal: Positive for joint pain and myalgias.  Skin: Negative.   Neurological: Negative.   Endo/Heme/Allergies: Negative.   Psychiatric/Behavioral: Negative.      Physical Exam:  weight is 184 lb (83.5 kg). His temporal temperature is 97.7 F (36.5 C). His blood  pressure is 166/75 (abnormal) and his pulse is 96. His respiration is 17 and oxygen saturation is 98%.   Wt Readings from Last 3 Encounters:  05/17/19 184 lb (83.5 kg)  04/26/19 196 lb (88.9 kg)  04/03/19 203 lb 3.2 oz (92.2 kg)   Physical Exam Vitals reviewed.  HENT:     Head: Normocephalic and atraumatic.  Eyes:     Pupils: Pupils are equal, round, and reactive to light.  Cardiovascular:     Rate and Rhythm: Normal rate and regular rhythm.     Heart sounds: Normal heart sounds.  Pulmonary:     Effort: Pulmonary effort is normal.     Breath sounds: Normal breath sounds.  Abdominal:     General: Bowel sounds are normal.     Palpations: Abdomen is soft.  Musculoskeletal:        General: No tenderness or deformity. Normal range of motion.     Cervical back: Normal range of motion.  Lymphadenopathy:     Cervical: No cervical adenopathy.  Skin:    General: Skin is warm and dry.     Findings: No erythema or rash.  Neurological:     Mental Status: He is alert and oriented to person, place, and time.  Psychiatric:        Behavior: Behavior normal.        Thought Content: Thought content normal.        Judgment: Judgment normal.       Lab Results  Component Value Date   WBC 6.3 05/17/2019   HGB 10.7 (L) 05/17/2019   HCT 33.9 (L) 05/17/2019   MCV 81.3 05/17/2019   PLT 218 05/17/2019   Lab Results  Component Value Date   FERRITIN 3,260 (H) 04/26/2019   IRON 117 04/26/2019   TIBC 253 04/26/2019   UIBC 135 04/26/2019   IRONPCTSAT 46 04/26/2019   Lab Results  Component Value Date   RETICCTPCT 0.9 03/20/2019   RBC 4.17 (L) 05/17/2019   RETICCTABS 27.5 03/21/2014   Lab Results  Component Value Date   KPAFRELGTCHN 69.0 (H) 04/26/2019   LAMBDASER 36.3 (H) 04/26/2019   KAPLAMBRATIO 1.90 (H) 04/26/2019   Lab Results  Component Value Date   IGGSERUM 1,170 04/26/2019   IGA 204 04/26/2019   IGMSERUM 59 04/26/2019   Lab Results  Component Value Date    TOTALPROTELP 7.3 04/26/2019   ALBUMINELP 3.8 04/26/2019   A1GS 0.3 04/26/2019   A2GS 1.2 (H) 04/26/2019   BETS 1.0 04/26/2019   BETA2SER 0.5 11/07/2014   GAMS 1.1 04/26/2019   MSPIKE Not Observed 04/26/2019   SPEI Comment 04/26/2019     Chemistry      Component Value Date/Time   NA 141 04/30/2019 2152   NA 143 02/22/2017 0744   K 3.8 04/30/2019 2152   K 4.0 02/22/2017 0744   CL 106 04/30/2019 2152   CL 106 02/22/2017 0744   CO2 25 04/30/2019 2152   CO2 27 02/22/2017 0744   BUN 15 04/30/2019 2152   BUN 26 (H) 02/22/2017 0744   CREATININE 2.06 (H) 04/30/2019 2152   CREATININE 2.03 (H) 04/26/2019 0820   CREATININE 2.3 (H) 02/22/2017 0744      Component Value Date/Time   CALCIUM 8.5 (L) 04/30/2019 2152   CALCIUM 9.3 02/22/2017 0744   ALKPHOS 85 04/30/2019 2152   ALKPHOS 80 02/22/2017 0744   AST 21 04/30/2019 2152   AST 22 04/26/2019 0820   ALT 24 04/30/2019 2152   ALT 20 04/26/2019 0820   ALT 22 02/22/2017 0744   BILITOT 0.5 04/30/2019 2152   BILITOT 0.3 04/26/2019 0820       Impression and Plan: Mr. Bourbon is a very pleasant 69 yo African American gentleman with IgG kappa myeloma.   I hope that his family doctor might be able to help him out.  Again I do not see any problems with the myeloma.  Again I would be shocked if there is any problems with this.  His testosterone levels not low.  I think he will get his testosterone injection today.  I again I know that he does have other issues.  I know with the diabetes and fibromyalgia this certainly could be exacerbated.  We will plan to see him back in about 3 weeks.   Volanda Napoleon, MD 3/24/20218:32 AM

## 2019-05-18 ENCOUNTER — Telehealth: Payer: Self-pay | Admitting: *Deleted

## 2019-05-18 LAB — IGG, IGA, IGM
IgA: 201 mg/dL (ref 61–437)
IgG (Immunoglobin G), Serum: 1110 mg/dL (ref 603–1613)
IgM (Immunoglobulin M), Srm: 56 mg/dL (ref 20–172)

## 2019-05-18 LAB — KAPPA/LAMBDA LIGHT CHAINS
Kappa free light chain: 38.2 mg/L — ABNORMAL HIGH (ref 3.3–19.4)
Kappa, lambda light chain ratio: 1.67 — ABNORMAL HIGH (ref 0.26–1.65)
Lambda free light chains: 22.9 mg/L (ref 5.7–26.3)

## 2019-05-18 NOTE — Telephone Encounter (Signed)
Pt notified per order of Dr. [name] that there is "no obvious issue with lung cancer.  No fluid.  No heart failure.  No myeloma spine or rib lesions!!"  Pt appreciative of call and has no questions or concerns at this time.

## 2019-05-18 NOTE — Telephone Encounter (Signed)
-----   Message from Volanda Napoleon, MD sent at 05/17/2019  4:48 PM EDT ----- Call - No obvious issue with lung cancer.  No fluid.  No heart failure.  No myeloma spine or rib lesions!!  Laurey Arrow

## 2019-05-19 LAB — PROTEIN ELECTROPHORESIS, SERUM
A/G Ratio: 1.1 (ref 0.7–1.7)
Albumin ELP: 4.1 g/dL (ref 2.9–4.4)
Alpha-1-Globulin: 0.3 g/dL (ref 0.0–0.4)
Alpha-2-Globulin: 1.2 g/dL — ABNORMAL HIGH (ref 0.4–1.0)
Beta Globulin: 1.1 g/dL (ref 0.7–1.3)
Gamma Globulin: 1.2 g/dL (ref 0.4–1.8)
Globulin, Total: 3.8 g/dL (ref 2.2–3.9)
Total Protein ELP: 7.9 g/dL (ref 6.0–8.5)

## 2019-06-07 NOTE — Progress Notes (Signed)
Pharmacist Chemotherapy Monitoring - Follow Up Assessment    I verify that I have reviewed each item in the below checklist:  . Regimen for the patient is scheduled for the appropriate day and plan matches scheduled date. Marland Kitchen Appropriate non-routine labs are ordered dependent on drug ordered. . If applicable, additional medications reviewed and ordered per protocol based on lifetime cumulative doses and/or treatment regimen.   Plan for follow-up and/or issues identified: Yes . I-vent associated with next due treatment: Yes . MD and/or nursing notified: No  [name], Jacqlyn Larsen 06/07/2019 8:33 AM

## 2019-06-14 ENCOUNTER — Other Ambulatory Visit: Payer: Self-pay

## 2019-06-14 ENCOUNTER — Inpatient Hospital Stay (HOSPITAL_BASED_OUTPATIENT_CLINIC_OR_DEPARTMENT_OTHER): Payer: Medicare Other | Admitting: Family

## 2019-06-14 ENCOUNTER — Inpatient Hospital Stay: Payer: Medicare Other

## 2019-06-14 ENCOUNTER — Other Ambulatory Visit: Payer: Self-pay | Admitting: Family

## 2019-06-14 ENCOUNTER — Telehealth: Payer: Self-pay | Admitting: Family

## 2019-06-14 ENCOUNTER — Inpatient Hospital Stay: Payer: Medicare Other | Attending: Hematology & Oncology

## 2019-06-14 VITALS — BP 156/72 | HR 95 | Temp 98.7°F | Resp 19 | Ht 71.0 in | Wt 185.0 lb

## 2019-06-14 DIAGNOSIS — E291 Testicular hypofunction: Secondary | ICD-10-CM | POA: Insufficient documentation

## 2019-06-14 DIAGNOSIS — R634 Abnormal weight loss: Secondary | ICD-10-CM

## 2019-06-14 DIAGNOSIS — Z5111 Encounter for antineoplastic chemotherapy: Secondary | ICD-10-CM | POA: Insufficient documentation

## 2019-06-14 DIAGNOSIS — D631 Anemia in chronic kidney disease: Secondary | ICD-10-CM

## 2019-06-14 DIAGNOSIS — Z5112 Encounter for antineoplastic immunotherapy: Secondary | ICD-10-CM | POA: Diagnosis present

## 2019-06-14 DIAGNOSIS — N189 Chronic kidney disease, unspecified: Secondary | ICD-10-CM

## 2019-06-14 DIAGNOSIS — E349 Endocrine disorder, unspecified: Secondary | ICD-10-CM

## 2019-06-14 DIAGNOSIS — M898X9 Other specified disorders of bone, unspecified site: Secondary | ICD-10-CM

## 2019-06-14 DIAGNOSIS — C9000 Multiple myeloma not having achieved remission: Secondary | ICD-10-CM

## 2019-06-14 DIAGNOSIS — M2550 Pain in unspecified joint: Secondary | ICD-10-CM

## 2019-06-14 DIAGNOSIS — D509 Iron deficiency anemia, unspecified: Secondary | ICD-10-CM | POA: Insufficient documentation

## 2019-06-14 DIAGNOSIS — D500 Iron deficiency anemia secondary to blood loss (chronic): Secondary | ICD-10-CM

## 2019-06-14 DIAGNOSIS — Z299 Encounter for prophylactic measures, unspecified: Secondary | ICD-10-CM

## 2019-06-14 LAB — CBC WITH DIFFERENTIAL (CANCER CENTER ONLY)
Abs Immature Granulocytes: 0.06 10*3/uL (ref 0.00–0.07)
Basophils Absolute: 0 10*3/uL (ref 0.0–0.1)
Basophils Relative: 1 %
Eosinophils Absolute: 0.1 10*3/uL (ref 0.0–0.5)
Eosinophils Relative: 2 %
HCT: 34.2 % — ABNORMAL LOW (ref 39.0–52.0)
Hemoglobin: 10.9 g/dL — ABNORMAL LOW (ref 13.0–17.0)
Immature Granulocytes: 1 %
Lymphocytes Relative: 8 %
Lymphs Abs: 0.4 10*3/uL — ABNORMAL LOW (ref 0.7–4.0)
MCH: 25.9 pg — ABNORMAL LOW (ref 26.0–34.0)
MCHC: 31.9 g/dL (ref 30.0–36.0)
MCV: 81.2 fL (ref 80.0–100.0)
Monocytes Absolute: 0.6 10*3/uL (ref 0.1–1.0)
Monocytes Relative: 10 %
Neutro Abs: 4.2 10*3/uL (ref 1.7–7.7)
Neutrophils Relative %: 78 %
Platelet Count: 147 10*3/uL — ABNORMAL LOW (ref 150–400)
RBC: 4.21 MIL/uL — ABNORMAL LOW (ref 4.22–5.81)
RDW: 18.1 % — ABNORMAL HIGH (ref 11.5–15.5)
WBC Count: 5.4 10*3/uL (ref 4.0–10.5)
nRBC: 0 % (ref 0.0–0.2)

## 2019-06-14 LAB — CMP (CANCER CENTER ONLY)
ALT: 10 U/L (ref 0–44)
AST: 18 U/L (ref 15–41)
Albumin: 4.4 g/dL (ref 3.5–5.0)
Alkaline Phosphatase: 65 U/L (ref 38–126)
Anion gap: 9 (ref 5–15)
BUN: 18 mg/dL (ref 8–23)
CO2: 30 mmol/L (ref 22–32)
Calcium: 9.5 mg/dL (ref 8.9–10.3)
Chloride: 103 mmol/L (ref 98–111)
Creatinine: 2.14 mg/dL — ABNORMAL HIGH (ref 0.61–1.24)
GFR, Est AFR Am: 35 mL/min — ABNORMAL LOW (ref >60)
GFR, Estimated: 30 mL/min — ABNORMAL LOW (ref >60)
Glucose, Bld: 134 mg/dL — ABNORMAL HIGH (ref 70–99)
Potassium: 3.1 mmol/L — ABNORMAL LOW (ref 3.5–5.1)
Sodium: 142 mmol/L (ref 135–145)
Total Bilirubin: 0.4 mg/dL (ref 0.3–1.2)
Total Protein: 7.2 g/dL (ref 6.5–8.1)

## 2019-06-14 LAB — IRON AND TIBC
Iron: 69 ug/dL (ref 42–163)
Saturation Ratios: 29 % (ref 20–55)
TIBC: 240 ug/dL (ref 202–409)
UIBC: 171 ug/dL (ref 117–376)

## 2019-06-14 LAB — FERRITIN: Ferritin: 2248 ng/mL — ABNORMAL HIGH (ref 24–336)

## 2019-06-14 LAB — RETICULOCYTES
Immature Retic Fract: 9.1 % (ref 2.3–15.9)
RBC.: 4.23 MIL/uL (ref 4.22–5.81)
Retic Count, Absolute: 20.7 10*3/uL (ref 19.0–186.0)
Retic Ct Pct: 0.5 % (ref 0.4–3.1)

## 2019-06-14 MED ORDER — TESTOSTERONE CYPIONATE 200 MG/ML IM SOLN
INTRAMUSCULAR | Status: AC
  Filled 2019-06-14: qty 2

## 2019-06-14 MED ORDER — PALONOSETRON HCL INJECTION 0.25 MG/5ML
INTRAVENOUS | Status: AC
  Filled 2019-06-14: qty 5

## 2019-06-14 MED ORDER — PALONOSETRON HCL INJECTION 0.25 MG/5ML
0.2500 mg | Freq: Once | INTRAVENOUS | Status: AC
  Administered 2019-06-14: 10:00:00 0.25 mg via INTRAVENOUS

## 2019-06-14 MED ORDER — HEPARIN SOD (PORK) LOCK FLUSH 100 UNIT/ML IV SOLN
500.0000 [IU] | Freq: Once | INTRAVENOUS | Status: AC | PRN
  Administered 2019-06-14: 500 [IU]
  Filled 2019-06-14: qty 5

## 2019-06-14 MED ORDER — ONDANSETRON HCL 8 MG PO TABS: ORAL_TABLET | ORAL | 0 refills | Status: DC

## 2019-06-14 MED ORDER — DEXTROSE 5 % IV SOLN
29.0000 mg/m2 | Freq: Once | INTRAVENOUS | Status: AC
  Administered 2019-06-14: 60 mg via INTRAVENOUS
  Filled 2019-06-14: qty 30

## 2019-06-14 MED ORDER — SODIUM CHLORIDE 0.9 % IV SOLN
Freq: Once | INTRAVENOUS | Status: DC
  Filled 2019-06-14: qty 250

## 2019-06-14 MED ORDER — SODIUM CHLORIDE 0.9 % IV SOLN
300.0000 mg/m2 | Freq: Once | INTRAVENOUS | Status: AC
  Administered 2019-06-14: 620 mg via INTRAVENOUS
  Filled 2019-06-14: qty 31

## 2019-06-14 MED ORDER — HYDROMORPHONE HCL 1 MG/ML IJ SOLN
2.0000 mg | Freq: Once | INTRAMUSCULAR | Status: AC
  Administered 2019-06-14: 2 mg via INTRAVENOUS

## 2019-06-14 MED ORDER — SODIUM CHLORIDE 0.9% FLUSH
10.0000 mL | INTRAVENOUS | Status: DC | PRN
  Administered 2019-06-14: 10 mL
  Filled 2019-06-14: qty 10

## 2019-06-14 MED ORDER — SODIUM CHLORIDE 0.9 % IV SOLN
10.0000 mg | Freq: Once | INTRAVENOUS | Status: AC
  Administered 2019-06-14: 10:00:00 10 mg via INTRAVENOUS
  Filled 2019-06-14: qty 10

## 2019-06-14 MED ORDER — SODIUM CHLORIDE 0.9 % IV SOLN
Freq: Once | INTRAVENOUS | Status: AC
  Filled 2019-06-14: qty 250

## 2019-06-14 MED ORDER — TESTOSTERONE CYPIONATE 200 MG/ML IM SOLN
400.0000 mg | Freq: Once | INTRAMUSCULAR | Status: AC
  Administered 2019-06-14: 400 mg via INTRAMUSCULAR

## 2019-06-14 MED ORDER — HYDROMORPHONE HCL 1 MG/ML IJ SOLN
INTRAMUSCULAR | Status: AC
  Filled 2019-06-14: qty 2

## 2019-06-14 NOTE — Progress Notes (Signed)
Hematology and Oncology Follow Up Visit  [name] [id number] 1951-01-20 69 y.o. 06/14/2019   Principle Diagnosis:  IgG kappa myeloma  Anemia secondary to renal insufficiency  Intermittent iron - deficiency anemia  Hypotestosteronemia  Current Therapy:        Aredia 60 mg IV q 3 months-- next dose06/2021 Aranesp 300 mcg subcu as needed for hemoglobin less than 11 DepoTestosterone400 mg q4weeks IV iron as indicated Kyprolis/Cytoxan - s/p cycle23   Interim History:  [name] is here today for follow-up and treatment. He states that he feels terrible. He states that since he had Covid his "bone" aches and pains have been worse and he has had intermittent fevers and chills. He is afebrile at this time but at home he has been as high as 100.2. He will follow-up with his PCP regarding the fevers if they continue.  March, no m-spike detected, IgG level 1,110 mg/dL and kappa light chains 3.82 mg/dL.  No n/v, cough, rash, dizziness, SOB, chest pain, palpitations, abdominal pain or changes in bowel or bladder habits.  He takes stool softeners to help with chronic constipation.  No swelling in his extremities at this time.  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  No falls or syncopal episodes to report.  He states that he eat fairly well and is hydrating. His weight is down 21 lbs since January of this year.   ECOG Performance Status: 1 - Symptomatic but completely ambulatory  Medications:  Allergies as of 06/14/2019      Reactions   Iodinated Diagnostic Agents Rash   Patient states he was instructed not to take IV contrast.  In 2008 he had an unknown reaction, and was told not to take it again.  He was also told not to take it due to his kidneys.   Iodine Anxiety, Rash, Other (See Comments)   Didn't feel right "instructed not to take per MD--something with his port"      Medication List       Accurate as of June 14, 2019  9:23 AM. If  you have any questions, ask your nurse or doctor.        amLODipine 5 MG tablet Commonly known as: NORVASC Take 5 mg by mouth daily.   amLODipine 10 MG tablet Commonly known as: NORVASC Take 10 mg by mouth 2 (two) times daily.   atorvastatin 40 MG tablet Commonly known as: LIPITOR Take 40 mg by mouth daily at 6 PM.   Carafate 1 GM/10ML suspension Generic drug: sucralfate Take 1 g by mouth 4 (four) times daily.   dicyclomine 10 MG capsule Commonly known as: BENTYL TAKE 1 CAPSULE BY MOUTH THREE TIMES A DAY What changed: See the new instructions.   dronabinol 2.5 MG capsule Commonly known as: MARINOL Take 1 capsule (2.5 mg total) by mouth 2 (two) times daily before a meal.   gabapentin 300 MG capsule Commonly known as: NEURONTIN Take one capsule by mouth three times a day. What changed:   how much to take  how to take this  when to take this  additional instructions   glimepiride 2 MG tablet Commonly known as: AMARYL Take 2 mg by mouth daily with breakfast.   glipiZIDE 5 MG tablet Commonly known as: GLUCOTROL Take 5 mg by mouth 2 (two) times daily.   lactulose 10 GM/15ML solution Commonly known as: CHRONULAC Take 30 mLs (20 g total) by mouth daily.   losartan 50 MG tablet  Commonly known as: COZAAR Take 50 mg by mouth daily.   metFORMIN 500 MG tablet Commonly known as: GLUCOPHAGE Take 500 mg by mouth 2 (two) times daily with a meal.   mirabegron ER 50 MG Tb24 tablet Commonly known as: MYRBETRIQ Take 50 mg by mouth daily.   morphine 30 MG 12 hr tablet Commonly known as: MS CONTIN Take 30 mg by mouth 2 (two) times daily.   omeprazole 40 MG capsule Commonly known as: PRILOSEC TAKE 1 CAPSULE BY MOUTH TWICE A DAY What changed: when to take this   ondansetron 8 MG tablet Commonly known as: ZOFRAN TAKE 1 TABLET(8 MG) BY MOUTH TWICE DAILY AS NEEDED FOR NAUSEA OR VOMITING What changed:   how much to take  how to take this  when to take  this  reasons to take this  additional instructions   OXYCODONE HCL PO Take 40 mg by mouth.   oxyCODONE-acetaminophen 10-325 MG tablet Commonly known as: PERCOCET Take 1 tablet by mouth every 6 (six) hours as needed for pain.   pioglitazone 30 MG tablet Commonly known as: ACTOS Take 30 mg by mouth daily.   prochlorperazine 10 MG tablet Commonly known as: COMPAZINE take 1 tablet by mouth every 6 hours if needed for nausea and vomiting   Restasis 0.05 % ophthalmic emulsion Generic drug: cycloSPORINE 1 drop 2 (two) times daily.   topiramate 25 MG tablet Commonly known as: TOPAMAX Take 25 mg by mouth daily.   torsemide 20 MG tablet Commonly known as: DEMADEX Take 40 mg by mouth daily.   Trulicity 6.96 VE/9.3YB Sopn Generic drug: Dulaglutide Inject 0.75 mg into the skin every Saturday.   Vitamin D (Ergocalciferol) 1.25 MG (50000 UNIT) Caps capsule Commonly known as: DRISDOL Take 50,000 Units by mouth every Saturday.       Allergies:  Allergies  Allergen Reactions  . Iodinated Diagnostic Agents Rash    Patient states he was instructed not to take IV contrast.  In 2008 he had an unknown reaction, and was told not to take it again.  He was also told not to take it due to his kidneys.  . Iodine Anxiety, Rash and Other (See Comments)    Didn't feel right "instructed not to take per MD--something with his port"     Past Medical History, Surgical history, Social history, and Family History were reviewed and updated.  Review of Systems: All other 10 point review of systems is negative.   Physical Exam:  height is 5\' 11"  (1.803 m) and weight is 185 lb (83.9 kg). His temporal temperature is 98.7 F (37.1 C). His blood pressure is 156/72 (abnormal) and his pulse is 95. His respiration is 19 and oxygen saturation is 96%.   Wt Readings from Last 3 Encounters:  06/14/19 185 lb (83.9 kg)  05/17/19 192 lb (87.1 kg)  05/17/19 184 lb (83.5 kg)    Ocular: Sclerae  unicteric, pupils equal, round and reactive to light Ear-nose-throat: Oropharynx clear, dentition fair Lymphatic: No cervical or supraclavicular adenopathy Lungs no rales or rhonchi, good excursion bilaterally Heart regular rate and rhythm, no murmur appreciated Abd soft, nontender, positive bowel sounds, no liver or spleen tip palpated on exam, no fluid wave  MSK no focal spinal tenderness, no joint edema Neuro: non-focal, well-oriented, appropriate affect Breasts: Deferred   Lab Results  Component Value Date   WBC 5.4 06/14/2019   HGB 10.9 (L) 06/14/2019   HCT 34.2 (L) 06/14/2019   MCV 81.2 06/14/2019  PLT 147 (L) 06/14/2019   Lab Results  Component Value Date   FERRITIN 2,675 (H) 05/17/2019   IRON 97 05/17/2019   TIBC 293 05/17/2019   UIBC 196 05/17/2019   IRONPCTSAT 33 05/17/2019   Lab Results  Component Value Date   RETICCTPCT 0.5 06/14/2019   RBC 4.21 (L) 06/14/2019   RBC 4.23 06/14/2019   RETICCTABS 27.5 03/21/2014   Lab Results  Component Value Date   KPAFRELGTCHN 38.2 (H) 05/17/2019   LAMBDASER 22.9 05/17/2019   KAPLAMBRATIO 1.67 (H) 05/17/2019   Lab Results  Component Value Date   IGGSERUM 1,110 05/17/2019   IGA 201 05/17/2019   IGMSERUM 56 05/17/2019   Lab Results  Component Value Date   TOTALPROTELP 7.9 05/17/2019   ALBUMINELP 4.1 05/17/2019   A1GS 0.3 05/17/2019   A2GS 1.2 (H) 05/17/2019   BETS 1.1 05/17/2019   BETA2SER 0.5 11/07/2014   GAMS 1.2 05/17/2019   MSPIKE Not Observed 05/17/2019   SPEI Comment 05/17/2019     Chemistry      Component Value Date/Time   NA 142 06/14/2019 0820   NA 143 02/22/2017 0744   K 3.1 (L) 06/14/2019 0820   K 4.0 02/22/2017 0744   CL 103 06/14/2019 0820   CL 106 02/22/2017 0744   CO2 30 06/14/2019 0820   CO2 27 02/22/2017 0744   BUN 18 06/14/2019 0820   BUN 26 (H) 02/22/2017 0744   CREATININE 2.14 (H) 06/14/2019 0820   CREATININE 2.3 (H) 02/22/2017 0744      Component Value Date/Time   CALCIUM 9.5  06/14/2019 0820   CALCIUM 9.3 02/22/2017 0744   ALKPHOS 65 06/14/2019 0820   ALKPHOS 80 02/22/2017 0744   AST 18 06/14/2019 0820   ALT 10 06/14/2019 0820   ALT 22 02/22/2017 0744   BILITOT 0.4 06/14/2019 0820       Impression and Plan: [name] is a very pleasant 69 yo African American gentleman with IgG kappa myeloma.  His weight loss is concerning. We will schedule a PET scan to better evaluate. If this is negative we will get him in with our nutritionist.  We will proceed with treatment today as planned.  We will see him back in another 3 weeks.  He promises to contact our office with any questions or concerns. We can certainly see him sooner if needed.   Laverna Peace, NP 4/21/20219:23 AM

## 2019-06-14 NOTE — Patient Instructions (Signed)

## 2019-06-14 NOTE — Telephone Encounter (Signed)
Appointments scheduled calendar printed per 4/21 los 

## 2019-06-14 NOTE — Progress Notes (Signed)
Ok to treat today per Judson Roch NP

## 2019-06-15 LAB — PROTEIN ELECTROPHORESIS, SERUM, WITH REFLEX
A/G Ratio: 1.1 (ref 0.7–1.7)
Albumin ELP: 3.5 g/dL (ref 2.9–4.4)
Alpha-1-Globulin: 0.2 g/dL (ref 0.0–0.4)
Alpha-2-Globulin: 1.1 g/dL — ABNORMAL HIGH (ref 0.4–1.0)
Beta Globulin: 1 g/dL (ref 0.7–1.3)
Gamma Globulin: 1 g/dL (ref 0.4–1.8)
Globulin, Total: 3.3 g/dL (ref 2.2–3.9)
Total Protein ELP: 6.8 g/dL (ref 6.0–8.5)

## 2019-06-15 LAB — KAPPA/LAMBDA LIGHT CHAINS
Kappa free light chain: 36.6 mg/L — ABNORMAL HIGH (ref 3.3–19.4)
Kappa, lambda light chain ratio: 1.54 (ref 0.26–1.65)
Lambda free light chains: 23.8 mg/L (ref 5.7–26.3)

## 2019-06-15 LAB — IGG, IGA, IGM
IgA: 178 mg/dL (ref 61–437)
IgG (Immunoglobin G), Serum: 970 mg/dL (ref 603–1613)
IgM (Immunoglobulin M), Srm: 44 mg/dL (ref 20–172)

## 2019-06-15 LAB — TESTOSTERONE: Testosterone: 429 ng/dL (ref 264–916)

## 2019-06-22 ENCOUNTER — Emergency Department (HOSPITAL_COMMUNITY)
Admission: EM | Admit: 2019-06-22 | Discharge: 2019-06-22 | Discharge status: Absent without leave | Payer: Medicare Other

## 2019-06-22 ENCOUNTER — Telehealth: Payer: Self-pay | Admitting: *Deleted

## 2019-06-22 ENCOUNTER — Other Ambulatory Visit: Payer: Self-pay

## 2019-06-22 ENCOUNTER — Ambulatory Visit (HOSPITAL_COMMUNITY)
Admission: RE | Admit: 2019-06-22 | Discharge: 2019-06-22 | Disposition: A | Discharge status: Home / Self Care | Payer: Medicare Other | Source: Ambulatory Visit | Attending: Family | Admitting: Family

## 2019-06-22 DIAGNOSIS — M898X9 Other specified disorders of bone, unspecified site: Secondary | ICD-10-CM

## 2019-06-22 DIAGNOSIS — C9000 Multiple myeloma not having achieved remission: Secondary | ICD-10-CM | POA: Insufficient documentation

## 2019-06-22 DIAGNOSIS — R634 Abnormal weight loss: Secondary | ICD-10-CM | POA: Diagnosis present

## 2019-06-22 LAB — GLUCOSE, CAPILLARY: Glucose-Capillary: 93 mg/dL (ref 70–99)

## 2019-06-22 MED ORDER — FLUDEOXYGLUCOSE F - 18 (FDG) INJECTION: 10.2000 | Freq: Once | INTRAVENOUS | Status: DC | PRN

## 2019-06-22 NOTE — Telephone Encounter (Signed)
As noted below by Laverna Peace, NP, I informed the patient that there is no evidence of Myeloma. He verbalized understanding.

## 2019-06-22 NOTE — Telephone Encounter (Signed)
-----   Message from Eliezer Bottom, NP sent at 06/22/2019 12:14 PM EDT ----- No evidence of Myeloma! WOO HOO!  ----- Message ----- From: Interface, Rad Results In Sent: 06/22/2019  10:10 AM EDT To: Eliezer Bottom, NP

## 2019-07-05 ENCOUNTER — Ambulatory Visit: Payer: Medicare Other

## 2019-07-05 ENCOUNTER — Other Ambulatory Visit: Payer: Medicare Other

## 2019-07-05 ENCOUNTER — Ambulatory Visit: Payer: Medicare Other | Admitting: Family

## 2019-07-06 ENCOUNTER — Telehealth: Payer: Self-pay | Admitting: Hematology & Oncology

## 2019-07-06 ENCOUNTER — Inpatient Hospital Stay: Payer: 59

## 2019-07-06 ENCOUNTER — Inpatient Hospital Stay: Payer: 59 | Attending: Hematology & Oncology

## 2019-07-06 ENCOUNTER — Other Ambulatory Visit: Payer: Self-pay

## 2019-07-06 ENCOUNTER — Inpatient Hospital Stay (HOSPITAL_BASED_OUTPATIENT_CLINIC_OR_DEPARTMENT_OTHER): Payer: 59 | Admitting: Family

## 2019-07-06 ENCOUNTER — Encounter: Payer: Self-pay | Admitting: Family

## 2019-07-06 VITALS — BP 160/70 | HR 95 | Temp 97.7°F | Resp 18 | Ht 71.0 in | Wt 191.0 lb

## 2019-07-06 VITALS — Wt 191.0 lb

## 2019-07-06 DIAGNOSIS — E291 Testicular hypofunction: Secondary | ICD-10-CM | POA: Diagnosis not present

## 2019-07-06 DIAGNOSIS — C9000 Multiple myeloma not having achieved remission: Secondary | ICD-10-CM | POA: Insufficient documentation

## 2019-07-06 DIAGNOSIS — Z299 Encounter for prophylactic measures, unspecified: Secondary | ICD-10-CM

## 2019-07-06 DIAGNOSIS — M4712 Other spondylosis with myelopathy, cervical region: Secondary | ICD-10-CM

## 2019-07-06 DIAGNOSIS — D631 Anemia in chronic kidney disease: Secondary | ICD-10-CM | POA: Insufficient documentation

## 2019-07-06 DIAGNOSIS — Z5112 Encounter for antineoplastic immunotherapy: Secondary | ICD-10-CM | POA: Diagnosis present

## 2019-07-06 DIAGNOSIS — N189 Chronic kidney disease, unspecified: Secondary | ICD-10-CM | POA: Diagnosis not present

## 2019-07-06 DIAGNOSIS — Z5111 Encounter for antineoplastic chemotherapy: Secondary | ICD-10-CM | POA: Diagnosis present

## 2019-07-06 DIAGNOSIS — E349 Endocrine disorder, unspecified: Secondary | ICD-10-CM

## 2019-07-06 DIAGNOSIS — D500 Iron deficiency anemia secondary to blood loss (chronic): Secondary | ICD-10-CM | POA: Diagnosis not present

## 2019-07-06 LAB — CMP (CANCER CENTER ONLY)
ALT: 10 U/L (ref 0–44)
AST: 17 U/L (ref 15–41)
Albumin: 4.2 g/dL (ref 3.5–5.0)
Alkaline Phosphatase: 64 U/L (ref 38–126)
Anion gap: 9 (ref 5–15)
BUN: 17 mg/dL (ref 8–23)
CO2: 30 mmol/L (ref 22–32)
Calcium: 9.5 mg/dL (ref 8.9–10.3)
Chloride: 105 mmol/L (ref 98–111)
Creatinine: 2.11 mg/dL — ABNORMAL HIGH (ref 0.61–1.24)
GFR, Est AFR Am: 36 mL/min — ABNORMAL LOW (ref >60)
GFR, Estimated: 31 mL/min — ABNORMAL LOW (ref >60)
Glucose, Bld: 71 mg/dL (ref 70–99)
Potassium: 3.4 mmol/L — ABNORMAL LOW (ref 3.5–5.1)
Sodium: 144 mmol/L (ref 135–145)
Total Bilirubin: 0.4 mg/dL (ref 0.3–1.2)
Total Protein: 6.7 g/dL (ref 6.5–8.1)

## 2019-07-06 LAB — CBC WITH DIFFERENTIAL (CANCER CENTER ONLY)
Abs Immature Granulocytes: 0.02 10*3/uL (ref 0.00–0.07)
Basophils Absolute: 0.1 10*3/uL (ref 0.0–0.1)
Basophils Relative: 1 %
Eosinophils Absolute: 0.1 10*3/uL (ref 0.0–0.5)
Eosinophils Relative: 3 %
HCT: 33.4 % — ABNORMAL LOW (ref 39.0–52.0)
Hemoglobin: 10.7 g/dL — ABNORMAL LOW (ref 13.0–17.0)
Immature Granulocytes: 0 %
Lymphocytes Relative: 29 %
Lymphs Abs: 1.3 10*3/uL (ref 0.7–4.0)
MCH: 26 pg (ref 26.0–34.0)
MCHC: 32 g/dL (ref 30.0–36.0)
MCV: 81.1 fL (ref 80.0–100.0)
Monocytes Absolute: 0.7 10*3/uL (ref 0.1–1.0)
Monocytes Relative: 15 %
Neutro Abs: 2.5 10*3/uL (ref 1.7–7.7)
Neutrophils Relative %: 52 %
Platelet Count: 183 10*3/uL (ref 150–400)
RBC: 4.12 MIL/uL — ABNORMAL LOW (ref 4.22–5.81)
RDW: 18.5 % — ABNORMAL HIGH (ref 11.5–15.5)
WBC Count: 4.7 10*3/uL (ref 4.0–10.5)
nRBC: 0 % (ref 0.0–0.2)

## 2019-07-06 MED ORDER — SODIUM CHLORIDE 0.9 % IV SOLN
300.0000 mg/m2 | Freq: Once | INTRAVENOUS | Status: AC
  Administered 2019-07-06: 620 mg via INTRAVENOUS
  Filled 2019-07-06: qty 31

## 2019-07-06 MED ORDER — HEPARIN SOD (PORK) LOCK FLUSH 100 UNIT/ML IV SOLN
250.0000 [IU] | Freq: Once | INTRAVENOUS | Status: DC | PRN
  Filled 2019-07-06: qty 5

## 2019-07-06 MED ORDER — ALTEPLASE 2 MG IJ SOLR
2.0000 mg | Freq: Once | INTRAMUSCULAR | Status: DC | PRN
  Filled 2019-07-06: qty 2

## 2019-07-06 MED ORDER — SODIUM CHLORIDE 0.9% FLUSH
10.0000 mL | INTRAVENOUS | Status: DC | PRN
  Administered 2019-07-06: 10 mL
  Filled 2019-07-06: qty 10

## 2019-07-06 MED ORDER — DEXTROSE 5 % IV SOLN
29.0000 mg/m2 | Freq: Once | INTRAVENOUS | Status: AC
  Administered 2019-07-06: 60 mg via INTRAVENOUS
  Filled 2019-07-06: qty 30

## 2019-07-06 MED ORDER — DARBEPOETIN ALFA 300 MCG/0.6ML IJ SOSY
PREFILLED_SYRINGE | INTRAMUSCULAR | Status: AC
  Filled 2019-07-06: qty 0.6

## 2019-07-06 MED ORDER — SODIUM CHLORIDE 0.9 % IV SOLN
INTRAVENOUS | Status: DC
  Filled 2019-07-06 (×2): qty 250

## 2019-07-06 MED ORDER — TESTOSTERONE CYPIONATE 200 MG/ML IM SOLN
400.0000 mg | Freq: Once | INTRAMUSCULAR | Status: AC
  Administered 2019-07-06: 400 mg via INTRAMUSCULAR

## 2019-07-06 MED ORDER — PALONOSETRON HCL INJECTION 0.25 MG/5ML
INTRAVENOUS | Status: AC
  Filled 2019-07-06: qty 5

## 2019-07-06 MED ORDER — SODIUM CHLORIDE 0.9 % IV SOLN
60.0000 mg | Freq: Once | INTRAVENOUS | Status: AC
  Administered 2019-07-06: 60 mg via INTRAVENOUS
  Filled 2019-07-06: qty 10

## 2019-07-06 MED ORDER — HEPARIN SOD (PORK) LOCK FLUSH 100 UNIT/ML IV SOLN
500.0000 [IU] | Freq: Once | INTRAVENOUS | Status: AC | PRN
  Administered 2019-07-06: 500 [IU]
  Filled 2019-07-06: qty 5

## 2019-07-06 MED ORDER — PALONOSETRON HCL INJECTION 0.25 MG/5ML
0.2500 mg | Freq: Once | INTRAVENOUS | Status: AC
  Administered 2019-07-06: 0.25 mg via INTRAVENOUS

## 2019-07-06 MED ORDER — HYDROMORPHONE HCL 1 MG/ML IJ SOLN
2.0000 mg | Freq: Once | INTRAMUSCULAR | Status: AC
  Administered 2019-07-06: 2 mg via INTRAVENOUS

## 2019-07-06 MED ORDER — TESTOSTERONE CYPIONATE 200 MG/ML IM SOLN
INTRAMUSCULAR | Status: AC
  Filled 2019-07-06: qty 2

## 2019-07-06 MED ORDER — DEXAMETHASONE SODIUM PHOSPHATE 10 MG/ML IJ SOLN: 10.0000 mg | Freq: Once | INTRAMUSCULAR | Status: DC

## 2019-07-06 MED ORDER — SODIUM CHLORIDE 0.9 % IV SOLN
10.0000 mg | Freq: Once | INTRAVENOUS | Status: AC
  Administered 2019-07-06: 10 mg via INTRAVENOUS
  Filled 2019-07-06: qty 10

## 2019-07-06 MED ORDER — SODIUM CHLORIDE 0.9% FLUSH
3.0000 mL | INTRAVENOUS | Status: DC | PRN
  Filled 2019-07-06: qty 10

## 2019-07-06 MED ORDER — HYDROMORPHONE HCL 1 MG/ML IJ SOLN
INTRAMUSCULAR | Status: AC
  Filled 2019-07-06: qty 2

## 2019-07-06 MED ORDER — SODIUM CHLORIDE 0.9 % IV SOLN
Freq: Once | INTRAVENOUS | Status: AC
  Filled 2019-07-06: qty 250

## 2019-07-06 MED ORDER — DARBEPOETIN ALFA 300 MCG/0.6ML IJ SOSY: 300.0000 ug | PREFILLED_SYRINGE | Freq: Once | INTRAMUSCULAR | Status: DC

## 2019-07-06 NOTE — Progress Notes (Signed)
Will proceed with testosterone injection today as well.  Hardie Pulley, PharmD, BCPS, BCOP

## 2019-07-06 NOTE — Patient Instructions (Signed)
Cyclophosphamide Injection What is this medicine? CYCLOPHOSPHAMIDE (sye kloe FOSS fa mide) is a chemotherapy drug. It slows the growth of cancer cells. This medicine is used to treat many types of cancer like lymphoma, myeloma, leukemia, breast cancer, and ovarian cancer, to name a few. This medicine may be used for other purposes; ask your health care provider or pharmacist if you have questions. COMMON BRAND NAME(S): Cytoxan, Neosar What should I tell my health care provider before I take this medicine? They need to know if you have any of these conditions:  heart disease  history of irregular heartbeat  infection  kidney disease  liver disease  low blood counts, like white cells, platelets, or red blood cells  on hemodialysis  recent or ongoing radiation therapy  scarring or thickening of the lungs  trouble passing urine  an unusual or allergic reaction to cyclophosphamide, other medicines, foods, dyes, or preservatives  pregnant or trying to get pregnant  breast-feeding How should I use this medicine? This drug is usually given as an injection into a vein or muscle or by infusion into a vein. It is administered in a hospital or clinic by a specially trained health care professional. Talk to your pediatrician regarding the use of this medicine in children. Special care may be needed. Overdosage: If you think you have taken too much of this medicine contact a poison control center or emergency room at once. NOTE: This medicine is only for you. Do not share this medicine with others. What if I miss a dose? It is important not to miss your dose. Call your doctor or health care professional if you are unable to keep an appointment. What may interact with this medicine?  amphotericin B  azathioprine  certain antivirals for HIV or hepatitis  certain medicines for blood pressure, heart disease, irregular heart beat  certain medicines that treat or prevent blood clots  like warfarin  certain other medicines for cancer  cyclosporine  etanercept  indomethacin  medicines that relax muscles for surgery  medicines to increase blood counts  metronidazole This list may not describe all possible interactions. Give your health care provider a list of all the medicines, herbs, non-prescription drugs, or dietary supplements you use. Also tell them if you smoke, drink alcohol, or use illegal drugs. Some items may interact with your medicine. What should I watch for while using this medicine? Your condition will be monitored carefully while you are receiving this medicine. You may need blood work done while you are taking this medicine. Drink water or other fluids as directed. Urinate often, even at night. Some products may contain alcohol. Ask your health care professional if this medicine contains alcohol. Be sure to tell all health care professionals you are taking this medicine. Certain medicines, like metronidazole and disulfiram, can cause an unpleasant reaction when taken with alcohol. The reaction includes flushing, headache, nausea, vomiting, sweating, and increased thirst. The reaction can last from 30 minutes to several hours. Do not become pregnant while taking this medicine or for 1 year after stopping it. Women should inform their health care professional if they wish to become pregnant or think they might be pregnant. Men should not father a child while taking this medicine and for 4 months after stopping it. There is potential for serious side effects to an unborn child. Talk to your health care professional for more information. Do not breast-feed an infant while taking this medicine or for 1 week after stopping it. This medicine has  caused ovarian failure in some women. This medicine may make it more difficult to get pregnant. Talk to your health care professional if you are concerned about your fertility. This medicine has caused decreased sperm  counts in some men. This may make it more difficult to father a child. Talk to your health care professional if you are concerned about your fertility. Call your health care professional for advice if you get a fever, chills, or sore throat, or other symptoms of a cold or flu. Do not treat yourself. This medicine decreases your body's ability to fight infections. Try to avoid being around people who are sick. Avoid taking medicines that contain aspirin, acetaminophen, ibuprofen, naproxen, or ketoprofen unless instructed by your health care professional. These medicines may hide a fever. Talk to your health care professional about your risk of cancer. You may be more at risk for certain types of cancer if you take this medicine. If you are going to need surgery or other procedure, tell your health care professional that you are using this medicine. Be careful brushing or flossing your teeth or using a toothpick because you may get an infection or bleed more easily. If you have any dental work done, tell your dentist you are receiving this medicine. What side effects may I notice from receiving this medicine? Side effects that you should report to your doctor or health care professional as soon as possible:  allergic reactions like skin rash, itching or hives, swelling of the face, lips, or tongue  breathing problems  nausea, vomiting  signs and symptoms of bleeding such as bloody or black, tarry stools; red or dark brown urine; spitting up blood or brown material that looks like coffee grounds; red spots on the skin; unusual bruising or bleeding from the eyes, gums, or nose  signs and symptoms of heart failure like fast, irregular heartbeat, sudden weight gain; swelling of the ankles, feet, hands  signs and symptoms of infection like fever; chills; cough; sore throat; pain or trouble passing urine  signs and symptoms of kidney injury like trouble passing urine or change in the amount of  urine  signs and symptoms of liver injury like dark yellow or brown urine; general ill feeling or flu-like symptoms; light-colored stools; loss of appetite; nausea; right upper belly pain; unusually weak or tired; yellowing of the eyes or skin Side effects that usually do not require medical attention (report to your doctor or health care professional if they continue or are bothersome):  confusion  decreased hearing  diarrhea  facial flushing  hair loss  headache  loss of appetite  missed menstrual periods  signs and symptoms of low red blood cells or anemia such as unusually weak or tired; feeling faint or lightheaded; falls  skin discoloration This list may not describe all possible side effects. Call your doctor for medical advice about side effects. You may report side effects to FDA at 1-800-FDA-1088. Where should I keep my medicine? This drug is given in a hospital or clinic and will not be stored at home. NOTE: This sheet is a summary. It may not cover all possible information. If you have questions about this medicine, talk to your doctor, pharmacist, or health care provider.  2020 [employer] Standard (2018-11-14 09:53:29) Carfilzomib injection What is this medicine? CARFILZOMIB (kar FILZ oh mib) targets a specific protein within cancer cells and stops the cancer cells from growing. It is used to treat multiple myeloma. This medicine may be used for other purposes;  ask your health care provider or pharmacist if you have questions. COMMON BRAND NAME(S): KYPROLIS What should I tell my health care provider before I take this medicine? They need to know if you have any of these conditions:  heart disease  history of blood clots  irregular heartbeat  kidney disease  liver disease  lung or breathing disease  an unusual or allergic reaction to carfilzomib, or other medicines, foods, dyes, or preservatives  pregnant or trying to get  pregnant  breast-feeding How should I use this medicine? This medicine is for injection or infusion into a vein. It is given by a health care professional in a hospital or clinic setting. Talk to your pediatrician regarding the use of this medicine in children. Special care may be needed. Overdosage: If you think you have taken too much of this medicine contact a poison control center or emergency room at once. NOTE: This medicine is only for you. Do not share this medicine with others. What if I miss a dose? It is important not to miss your dose. Call your doctor or health care professional if you are unable to keep an appointment. What may interact with this medicine? Interactions are not expected. Give your health care provider a list of all the medicines, herbs, non-prescription drugs, or dietary supplements you use. Also tell them if you smoke, drink alcohol, or use illegal drugs. Some items may interact with your medicine. This list may not describe all possible interactions. Give your health care provider a list of all the medicines, herbs, non-prescription drugs, or dietary supplements you use. Also tell them if you smoke, drink alcohol, or use illegal drugs. Some items may interact with your medicine. What should I watch for while using this medicine? Your condition will be monitored carefully while you are receiving this medicine. Report any side effects. Continue your course of treatment even though you feel ill unless your doctor tells you to stop. You may need blood work done while you are taking this medicine. Do not become pregnant while taking this medicine or for at least 6 months after stopping it. Women should inform their doctor if they wish to become pregnant or think they might be pregnant. There is a potential for serious side effects to an unborn child. Men should not father a child while taking this medicine and for at least 3 months after stopping it. Talk to your health  care professional or pharmacist for more information. Do not breast-feed an infant while taking this medicine or for 2 weeks after the last dose. Check with your doctor or health care professional if you get an attack of severe diarrhea, nausea and vomiting, or if you sweat a lot. The loss of too much body fluid can make it dangerous for you to take this medicine. You may get dizzy. Do not drive, use machinery, or do anything that needs mental alertness until you know how this medicine affects you. Do not stand or sit up quickly, especially if you are an older patient. This reduces the risk of dizzy or fainting spells. What side effects may I notice from receiving this medicine? Side effects that you should report to your doctor or health care professional as soon as possible:  allergic reactions like skin rash, itching or hives, swelling of the face, lips, or tongue  confusion  dizziness  feeling faint or lightheaded  fever or chills  palpitations  seizures  signs and symptoms of bleeding such as bloody or black,  tarry stools; red or dark-brown urine; spitting up blood or brown material that looks like coffee grounds; red spots on the skin; unusual bruising or bleeding including from the eye, gums, or nose  signs and symptoms of a blood clot such as breathing problems; changes in vision; chest pain; severe, sudden headache; pain, swelling, warmth in the leg; trouble speaking; sudden numbness or weakness of the face, arm or leg  signs and symptoms of kidney injury like trouble passing urine or change in the amount of urine  signs and symptoms of liver injury like dark yellow or brown urine; general ill feeling or flu-like symptoms; light-colored stools; loss of appetite; nausea; right upper belly pain; unusually weak or tired; yellowing of the eyes or skin Side effects that usually do not require medical attention (report to your doctor or health care professional if they continue or are  bothersome):  back pain  cough  diarrhea  headache  muscle cramps  trouble sleeping  vomiting This list may not describe all possible side effects. Call your doctor for medical advice about side effects. You may report side effects to FDA at 1-800-FDA-1088. Where should I keep my medicine? This drug is given in a hospital or clinic and will not be stored at home. NOTE: This sheet is a summary. It may not cover all possible information. If you have questions about this medicine, talk to your doctor, pharmacist, or health care provider.  2020 [employer]/Gold Standard (2018-10-17 19:44:21) Pamidronate injection What is this medicine? PAMIDRONATE (pa mi DROE nate) slows calcium loss from bones. It is used to treat high calcium blood levels from cancer or Paget's disease. It is also used to treat bone pain and prevent fractures from certain cancers that have spread to the bone. This medicine may be used for other purposes; ask your health care provider or pharmacist if you have questions. COMMON BRAND NAME(S): Aredia What should I tell my health care provider before I take this medicine? They need to know if you have any of these conditions:  aspirin-sensitive asthma  dental disease  kidney disease  an unusual or allergic reaction to pamidronate, other medicines, foods, dyes, or preservatives  pregnant or trying to get pregnant  breast-feeding How should I use this medicine? This medicine is for infusion into a vein. It is given by a health care professional in a hospital or clinic setting. Talk to your pediatrician regarding the use of this medicine in children. This medicine is not approved for use in children. Overdosage: If you think you have taken too much of this medicine contact a poison control center or emergency room at once. NOTE: This medicine is only for you. Do not share this medicine with others. What if I miss a dose? This does not apply. What may interact with  this medicine?  certain antibiotics given by injection  medicines for inflammation or pain like ibuprofen, naproxen  some diuretics like bumetanide, furosemide  cyclosporine  parathyroid hormone  tacrolimus  teriparatide  thalidomide This list may not describe all possible interactions. Give your health care provider a list of all the medicines, herbs, non-prescription drugs, or dietary supplements you use. Also tell them if you smoke, drink alcohol, or use illegal drugs. Some items may interact with your medicine. What should I watch for while using this medicine? Visit your doctor or health care professional for regular checkups. It may be some time before you see the benefit from this medicine. Do not stop taking your medicine  unless your doctor tells you to. Your doctor may order blood tests or other tests to see how you are doing. Women should inform their doctor if they wish to become pregnant or think they might be pregnant. There is a potential for serious side effects to an unborn child. Talk to your health care professional or pharmacist for more information. You should make sure that you get enough calcium and vitamin D while you are taking this medicine. Discuss the foods you eat and the vitamins you take with your health care professional. Some people who take this medicine have severe bone, joint, and/or muscle pain. This medicine may also increase your risk for a broken thigh bone. Tell your doctor right away if you have pain in your upper leg or groin. Tell your doctor if you have any pain that does not go away or that gets worse. What side effects may I notice from receiving this medicine? Side effects that you should report to your doctor or health care professional as soon as possible:  allergic reactions like skin rash, itching or hives, swelling of the face, lips, or tongue  black or tarry stools  changes in vision  eye inflammation, pain  high blood  pressure  jaw pain, especially burning or cramping  muscle weakness  numb, tingling pain  swelling of feet or hands  trouble passing urine or change in the amount of urine  unable to move easily Side effects that usually do not require medical attention (report to your doctor or health care professional if they continue or are bothersome):  bone, joint, or muscle pain  constipation  dizzy, drowsy  fever  headache  loss of appetite  nausea, vomiting  pain at site where injected This list may not describe all possible side effects. Call your doctor for medical advice about side effects. You may report side effects to FDA at 1-800-FDA-1088. Where should I keep my medicine? This drug is given in a hospital or clinic and will not be stored at home. NOTE: This sheet is a summary. It may not cover all possible information. If you have questions about this medicine, talk to your doctor, pharmacist, or health care provider.  2020 [employer]/Gold Standard (2010-08-08 08:49:49)

## 2019-07-06 NOTE — Telephone Encounter (Signed)
Appointments scheduled 5/13

## 2019-07-06 NOTE — Progress Notes (Signed)
Per Dr [name], patient to get Aredia today in addition to chemo. Ok to treat with creatinine of 2.11 per MD. dph

## 2019-07-06 NOTE — Patient Instructions (Signed)

## 2019-07-06 NOTE — Progress Notes (Signed)
Hematology and Oncology Follow Up Visit  [name] [id number] Jan 28, 1951 69 y.o. 07/06/2019   Principle Diagnosis:  IgG kappa myeloma  Anemia secondary to renal insufficiency  Intermittent iron - deficiency anemia  Hypotestosteronemia  Current Therapy: Aredia 60 mg IV q 3 months-- next dose06/2021 Aranesp 300 mcg subcu as needed for hemoglobin less than 11 DepoTestosterone400 mg q4weeks IV iron as indicated Kyprolis/Cytoxan - s/p cycle23   Interim History:  [name] is here today for follow-up and treatment. He is still having an exacerbation with his arthritis since having Covid. This is quite miserable for him. He states that he has let his orthopedist know.  M-spike last month was not observed, IgG level 970 mg/dL and kappa light chains 3.66 mg/dL.   PET scan 2 weeks ago showed no evidence active myeloma or lytic bone lesions. .  No fever, chills, n/v, cough, rash, dizziness, SOB, chest pain, palpitations, abdominal pain or changes in bowel or bladder habits.  No swelling in his extremities at this time.  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  No falls or syncopal episodes to report.  His appetite comes and goes and is staying well hydrated. His weight is stable at 191 lbs.   ECOG Performance Status: 1 - Symptomatic but completely ambulatory  Medications:  Allergies as of 07/06/2019      Reactions   Iodinated Diagnostic Agents Rash   Patient states he was instructed not to take IV contrast.  In 2008 he had an unknown reaction, and was told not to take it again.  He was also told not to take it due to his kidneys.   Iodine Anxiety, Rash, Other (See Comments)   Didn't feel right "instructed not to take per MD--something with his port"      Medication List       Accurate as of Jul 06, 2019  8:54 AM. If you have any questions, ask your nurse or doctor.        amLODipine 10 MG tablet Commonly known as: NORVASC Take 10 mg by mouth 2  (two) times daily. What changed: Another medication with the same name was removed. Continue taking this medication, and follow the directions you see here. Changed by: Adrian Peace, NP   atorvastatin 40 MG tablet Commonly known as: LIPITOR Take 40 mg by mouth daily at 6 PM.   Carafate 1 GM/10ML suspension Generic drug: sucralfate Take 1 g by mouth 4 (four) times daily.   dicyclomine 10 MG capsule Commonly known as: BENTYL TAKE 1 CAPSULE BY MOUTH THREE TIMES A DAY What changed: See the new instructions.   dronabinol 2.5 MG capsule Commonly known as: MARINOL Take 1 capsule (2.5 mg total) by mouth 2 (two) times daily before a meal.   gabapentin 300 MG capsule Commonly known as: NEURONTIN Take one capsule by mouth three times a day. What changed:   how much to take  how to take this  when to take this  additional instructions   glimepiride 2 MG tablet Commonly known as: AMARYL Take 2 mg by mouth daily with breakfast.   glipiZIDE 5 MG tablet Commonly known as: GLUCOTROL Take 5 mg by mouth 2 (two) times daily.   lactulose 10 GM/15ML solution Commonly known as: CHRONULAC Take 30 mLs (20 g total) by mouth daily.   losartan 50 MG tablet Commonly known as: COZAAR Take 50 mg by mouth daily.   metFORMIN 500 MG tablet Commonly known as: GLUCOPHAGE Take 500 mg by  mouth 2 (two) times daily with a meal.   mirabegron ER 50 MG Tb24 tablet Commonly known as: MYRBETRIQ Take 50 mg by mouth daily.   morphine 30 MG 12 hr tablet Commonly known as: MS CONTIN Take 30 mg by mouth 2 (two) times daily.   omeprazole 40 MG capsule Commonly known as: PRILOSEC TAKE 1 CAPSULE BY MOUTH TWICE A DAY What changed: when to take this   ondansetron 8 MG tablet Commonly known as: ZOFRAN TAKE 1 TABLET(8 MG) BY MOUTH TWICE DAILY AS NEEDED FOR NAUSEA OR VOMITING   OXYCODONE HCL PO Take 40 mg by mouth.   oxyCODONE-acetaminophen 10-325 MG tablet Commonly known as: PERCOCET Take 1  tablet by mouth every 6 (six) hours as needed for pain.   pioglitazone 30 MG tablet Commonly known as: ACTOS Take 30 mg by mouth daily.   prochlorperazine 10 MG tablet Commonly known as: COMPAZINE take 1 tablet by mouth every 6 hours if needed for nausea and vomiting   Restasis 0.05 % ophthalmic emulsion Generic drug: cycloSPORINE 1 drop 2 (two) times daily.   topiramate 25 MG tablet Commonly known as: TOPAMAX Take 25 mg by mouth daily.   torsemide 20 MG tablet Commonly known as: DEMADEX Take 40 mg by mouth daily.   Trulicity 7.86 VE/7.2CN Sopn Generic drug: Dulaglutide Inject 0.75 mg into the skin every Saturday.   Vitamin D (Ergocalciferol) 1.25 MG (50000 UNIT) Caps capsule Commonly known as: DRISDOL Take 50,000 Units by mouth every Saturday.       Allergies:  Allergies  Allergen Reactions  . Iodinated Diagnostic Agents Rash    Patient states he was instructed not to take IV contrast.  In 2008 he had an unknown reaction, and was told not to take it again.  He was also told not to take it due to his kidneys.  . Iodine Anxiety, Rash and Other (See Comments)    Didn't feel right "instructed not to take per MD--something with his port"     Past Medical History, Surgical history, Social history, and Family History were reviewed and updated.  Review of Systems: All other 10 point review of systems is negative.   Physical Exam:  height is 5\' 11"  (1.803 m) and weight is 191 lb (86.6 kg). His temporal temperature is 97.7 F (36.5 C). His blood pressure is 160/70 (abnormal) and his pulse is 95. His respiration is 18 and oxygen saturation is 100%.   Wt Readings from Last 3 Encounters:  07/06/19 191 lb (86.6 kg)  06/14/19 185 lb (83.9 kg)  05/17/19 192 lb (87.1 kg)    Ocular: Sclerae unicteric, pupils equal, round and reactive to light Ear-nose-throat: Oropharynx clear, dentition fair Lymphatic: No cervical or supraclavicular adenopathy Lungs no rales or rhonchi,  good excursion bilaterally Heart regular rate and rhythm, no murmur appreciated Abd soft, nontender, positive bowel sounds, no liver or spleen tip palpated on exam, no fluid wave  MSK no focal spinal tenderness, no joint edema Neuro: non-focal, well-oriented, appropriate affect Breasts: Deferred   Lab Results  Component Value Date   WBC 4.7 07/06/2019   HGB 10.7 (L) 07/06/2019   HCT 33.4 (L) 07/06/2019   MCV 81.1 07/06/2019   PLT 183 07/06/2019   Lab Results  Component Value Date   FERRITIN 2,248 (H) 06/14/2019   IRON 69 06/14/2019   TIBC 240 06/14/2019   UIBC 171 06/14/2019   IRONPCTSAT 29 06/14/2019   Lab Results  Component Value Date   RETICCTPCT 0.5 06/14/2019  RBC 4.12 (L) 07/06/2019   RETICCTABS 27.5 03/21/2014   Lab Results  Component Value Date   KPAFRELGTCHN 36.6 (H) 06/14/2019   LAMBDASER 23.8 06/14/2019   KAPLAMBRATIO 1.54 06/14/2019   Lab Results  Component Value Date   IGGSERUM 970 06/14/2019   IGA 178 06/14/2019   IGMSERUM 44 06/14/2019   Lab Results  Component Value Date   TOTALPROTELP 6.8 06/14/2019   ALBUMINELP 3.5 06/14/2019   A1GS 0.2 06/14/2019   A2GS 1.1 (H) 06/14/2019   BETS 1.0 06/14/2019   BETA2SER 0.5 11/07/2014   GAMS 1.0 06/14/2019   MSPIKE Not Observed 06/14/2019   SPEI Comment 05/17/2019     Chemistry      Component Value Date/Time   NA 142 06/14/2019 0820   NA 143 02/22/2017 0744   K 3.1 (L) 06/14/2019 0820   K 4.0 02/22/2017 0744   CL 103 06/14/2019 0820   CL 106 02/22/2017 0744   CO2 30 06/14/2019 0820   CO2 27 02/22/2017 0744   BUN 18 06/14/2019 0820   BUN 26 (H) 02/22/2017 0744   CREATININE 2.14 (H) 06/14/2019 0820   CREATININE 2.3 (H) 02/22/2017 0744      Component Value Date/Time   CALCIUM 9.5 06/14/2019 0820   CALCIUM 9.3 02/22/2017 0744   ALKPHOS 65 06/14/2019 0820   ALKPHOS 80 02/22/2017 0744   AST 18 06/14/2019 0820   ALT 10 06/14/2019 0820   ALT 22 02/22/2017 0744   BILITOT 0.4 06/14/2019 0820        Impression and Plan: Mr. Goodchild is a very pleasant 69 yo African American gentleman with IgG kappa myeloma. We will proceed with treatment today as planned.  We will see him back in another 3 weeks.  He promises to contact our office with any questions or concerns. We can certainly see him sooner if needed.   Adrian Peace, NP 5/13/20218:54 AM

## 2019-07-12 ENCOUNTER — Other Ambulatory Visit: Payer: Self-pay | Admitting: Hematology & Oncology

## 2019-07-12 DIAGNOSIS — K219 Gastro-esophageal reflux disease without esophagitis: Secondary | ICD-10-CM

## 2019-07-20 ENCOUNTER — Other Ambulatory Visit: Payer: Self-pay | Admitting: Family

## 2019-07-20 DIAGNOSIS — C9000 Multiple myeloma not having achieved remission: Secondary | ICD-10-CM

## 2019-07-20 DIAGNOSIS — G629 Polyneuropathy, unspecified: Secondary | ICD-10-CM

## 2019-07-25 ENCOUNTER — Other Ambulatory Visit: Payer: Self-pay

## 2019-07-25 DIAGNOSIS — R1084 Generalized abdominal pain: Secondary | ICD-10-CM

## 2019-07-25 DIAGNOSIS — R1319 Other dysphagia: Secondary | ICD-10-CM

## 2019-07-25 MED ORDER — DICYCLOMINE HCL 10 MG PO CAPS: 10.0000 mg | ORAL_CAPSULE | Freq: Three times a day (TID) | ORAL | 3 refills | Status: DC

## 2019-07-27 ENCOUNTER — Other Ambulatory Visit: Payer: Self-pay

## 2019-07-27 ENCOUNTER — Telehealth: Payer: Self-pay | Admitting: Hematology & Oncology

## 2019-07-27 ENCOUNTER — Inpatient Hospital Stay: Payer: 59

## 2019-07-27 ENCOUNTER — Encounter: Payer: Self-pay | Admitting: Family

## 2019-07-27 ENCOUNTER — Inpatient Hospital Stay: Payer: 59 | Attending: Hematology & Oncology | Admitting: Family

## 2019-07-27 VITALS — BP 144/93 | HR 85 | Temp 98.6°F | Resp 18 | Ht 71.0 in | Wt 192.8 lb

## 2019-07-27 DIAGNOSIS — Z5111 Encounter for antineoplastic chemotherapy: Secondary | ICD-10-CM | POA: Insufficient documentation

## 2019-07-27 DIAGNOSIS — C9000 Multiple myeloma not having achieved remission: Secondary | ICD-10-CM | POA: Diagnosis present

## 2019-07-27 DIAGNOSIS — D500 Iron deficiency anemia secondary to blood loss (chronic): Secondary | ICD-10-CM | POA: Diagnosis not present

## 2019-07-27 DIAGNOSIS — Z299 Encounter for prophylactic measures, unspecified: Secondary | ICD-10-CM

## 2019-07-27 DIAGNOSIS — E349 Endocrine disorder, unspecified: Secondary | ICD-10-CM

## 2019-07-27 DIAGNOSIS — D631 Anemia in chronic kidney disease: Secondary | ICD-10-CM

## 2019-07-27 DIAGNOSIS — D509 Iron deficiency anemia, unspecified: Secondary | ICD-10-CM | POA: Insufficient documentation

## 2019-07-27 DIAGNOSIS — D508 Other iron deficiency anemias: Secondary | ICD-10-CM

## 2019-07-27 DIAGNOSIS — N189 Chronic kidney disease, unspecified: Secondary | ICD-10-CM | POA: Diagnosis not present

## 2019-07-27 DIAGNOSIS — Z5112 Encounter for antineoplastic immunotherapy: Secondary | ICD-10-CM | POA: Diagnosis present

## 2019-07-27 LAB — CBC WITH DIFFERENTIAL (CANCER CENTER ONLY)
Abs Immature Granulocytes: 0.04 10*3/uL (ref 0.00–0.07)
Basophils Absolute: 0.1 10*3/uL (ref 0.0–0.1)
Basophils Relative: 1 %
Eosinophils Absolute: 0.2 10*3/uL (ref 0.0–0.5)
Eosinophils Relative: 4 %
HCT: 32.5 % — ABNORMAL LOW (ref 39.0–52.0)
Hemoglobin: 10.3 g/dL — ABNORMAL LOW (ref 13.0–17.0)
Immature Granulocytes: 1 %
Lymphocytes Relative: 14 %
Lymphs Abs: 0.7 10*3/uL (ref 0.7–4.0)
MCH: 25.9 pg — ABNORMAL LOW (ref 26.0–34.0)
MCHC: 31.7 g/dL (ref 30.0–36.0)
MCV: 81.9 fL (ref 80.0–100.0)
Monocytes Absolute: 0.7 10*3/uL (ref 0.1–1.0)
Monocytes Relative: 15 %
Neutro Abs: 2.9 10*3/uL (ref 1.7–7.7)
Neutrophils Relative %: 65 %
Platelet Count: 164 10*3/uL (ref 150–400)
RBC: 3.97 MIL/uL — ABNORMAL LOW (ref 4.22–5.81)
RDW: 17.9 % — ABNORMAL HIGH (ref 11.5–15.5)
WBC Count: 4.5 10*3/uL (ref 4.0–10.5)
nRBC: 0 % (ref 0.0–0.2)

## 2019-07-27 LAB — CMP (CANCER CENTER ONLY)
ALT: 11 U/L (ref 0–44)
AST: 20 U/L (ref 15–41)
Albumin: 4.3 g/dL (ref 3.5–5.0)
Alkaline Phosphatase: 73 U/L (ref 38–126)
Anion gap: 8 (ref 5–15)
BUN: 27 mg/dL — ABNORMAL HIGH (ref 8–23)
CO2: 30 mmol/L (ref 22–32)
Calcium: 9.3 mg/dL (ref 8.9–10.3)
Chloride: 102 mmol/L (ref 98–111)
Creatinine: 2.79 mg/dL — ABNORMAL HIGH (ref 0.61–1.24)
GFR, Est AFR Am: 25 mL/min — ABNORMAL LOW (ref >60)
GFR, Estimated: 22 mL/min — ABNORMAL LOW (ref >60)
Glucose, Bld: 126 mg/dL — ABNORMAL HIGH (ref 70–99)
Potassium: 3.6 mmol/L (ref 3.5–5.1)
Sodium: 140 mmol/L (ref 135–145)
Total Bilirubin: 0.4 mg/dL (ref 0.3–1.2)
Total Protein: 7 g/dL (ref 6.5–8.1)

## 2019-07-27 LAB — RETICULOCYTES
Immature Retic Fract: 16.5 % — ABNORMAL HIGH (ref 2.3–15.9)
RBC.: 3.89 MIL/uL — ABNORMAL LOW (ref 4.22–5.81)
Retic Count, Absolute: 41.2 10*3/uL (ref 19.0–186.0)
Retic Ct Pct: 1.1 % (ref 0.4–3.1)

## 2019-07-27 LAB — IRON AND TIBC
Iron: 65 ug/dL (ref 42–163)
Saturation Ratios: 24 % (ref 20–55)
TIBC: 273 ug/dL (ref 202–409)
UIBC: 208 ug/dL (ref 117–376)

## 2019-07-27 LAB — FERRITIN: Ferritin: 2134 ng/mL — ABNORMAL HIGH (ref 24–336)

## 2019-07-27 MED ORDER — PALONOSETRON HCL INJECTION 0.25 MG/5ML
0.2500 mg | Freq: Once | INTRAVENOUS | Status: AC
  Administered 2019-07-27: 0.25 mg via INTRAVENOUS

## 2019-07-27 MED ORDER — SODIUM CHLORIDE 0.9 % IV SOLN
Freq: Once | INTRAVENOUS | Status: DC
  Filled 2019-07-27: qty 250

## 2019-07-27 MED ORDER — DEXTROSE 5 % IV SOLN
28.6000 mg/m2 | Freq: Once | INTRAVENOUS | Status: AC
  Administered 2019-07-27: 60 mg via INTRAVENOUS
  Filled 2019-07-27: qty 30

## 2019-07-27 MED ORDER — SODIUM CHLORIDE 0.9 % IV SOLN
Freq: Once | INTRAVENOUS | Status: AC
  Filled 2019-07-27: qty 250

## 2019-07-27 MED ORDER — SODIUM CHLORIDE 0.9% FLUSH
10.0000 mL | INTRAVENOUS | Status: DC | PRN
  Administered 2019-07-27: 10 mL
  Filled 2019-07-27: qty 10

## 2019-07-27 MED ORDER — SODIUM CHLORIDE 0.9 % IV SOLN
10.0000 mg | Freq: Once | INTRAVENOUS | Status: AC
  Administered 2019-07-27: 10 mg via INTRAVENOUS
  Filled 2019-07-27: qty 10

## 2019-07-27 MED ORDER — HYDROMORPHONE HCL 1 MG/ML IJ SOLN
INTRAMUSCULAR | Status: AC
  Filled 2019-07-27: qty 2

## 2019-07-27 MED ORDER — PALONOSETRON HCL INJECTION 0.25 MG/5ML
INTRAVENOUS | Status: AC
  Filled 2019-07-27: qty 5

## 2019-07-27 MED ORDER — HYDROMORPHONE HCL 1 MG/ML IJ SOLN
2.0000 mg | Freq: Once | INTRAMUSCULAR | Status: AC
  Administered 2019-07-27: 2 mg via INTRAVENOUS

## 2019-07-27 MED ORDER — SODIUM CHLORIDE 0.9 % IV SOLN
300.0000 mg/m2 | Freq: Once | INTRAVENOUS | Status: AC
  Administered 2019-07-27: 620 mg via INTRAVENOUS
  Filled 2019-07-27: qty 31

## 2019-07-27 MED ORDER — HEPARIN SOD (PORK) LOCK FLUSH 100 UNIT/ML IV SOLN
500.0000 [IU] | Freq: Once | INTRAVENOUS | Status: AC | PRN
  Administered 2019-07-27: 500 [IU]
  Filled 2019-07-27: qty 5

## 2019-07-27 NOTE — Progress Notes (Signed)
Ok to treat with labs and creatinine today per Judson Roch NP

## 2019-07-27 NOTE — Addendum Note (Signed)
Addended by: Eliezer Bottom on: 07/27/2019 09:35 AM   Modules accepted: Orders

## 2019-07-27 NOTE — Telephone Encounter (Signed)
Per 6/3 secure chat treatment plan will be q4w and I have updated his appointments to reflect this change. Patient was also given update schedule

## 2019-07-27 NOTE — Progress Notes (Addendum)
Hematology and Oncology Follow Up Visit  [name] [id number] [birth year] 69 y.o. 07/27/2019   Principle Diagnosis:  IgG kappa myeloma  Anemia secondary to renal insufficiency  Intermittent iron - deficiency anemia  Hypotestosteronemia  Current Therapy: Aredia 60 mg IV q 3 months-- next dose06/2021 Aranesp 300 mcg subcu as needed for hemoglobin less than 11 DepoTestosterone400 mg q4weeks IV iron as indicated Kyprolis/Cytoxan - s/p cycle23   Interim History:  [name] is here today for follow-up and treatment. He is doing fairly well but continues to have increased pain in the back and legs. He states that he is quite miserable and that his quality of life is poor as it is effecting his mobility. I did reach out to update his PCP and ask if he has an orthopedist as [name] was unsure.  He has occasional episodes of SOB with over exertion and takes a break to rest as needed.   No fever, chills, n/v, cough, rash, dizziness, chest pain, palpitations, abdominal pain or changes in bowel or bladder habits.  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  No fall or syncopal episodes to report.  His appetite comes and goes. He does feel that he is hydrating properly. His weight is stable at 192 lbs.   ECOG Performance Status: 1 - Symptomatic but completely ambulatory  Medications:  Allergies as of 07/27/2019      Reactions   Iodinated Diagnostic Agents Rash   Patient states he was instructed not to take IV contrast.  In 2008 he had an unknown reaction, and was told not to take it again.  He was also told not to take it due to his kidneys.   Iodine Anxiety, Rash, Other (See Comments)   Didn't feel right "instructed not to take per MD--something with his port"      Medication List       Accurate as of July 27, 2019  8:30 AM. If you have any questions, ask your nurse or doctor.        amLODipine 10 MG tablet Commonly known as: NORVASC Take 10 mg by  mouth 2 (two) times daily.   atorvastatin 40 MG tablet Commonly known as: LIPITOR Take 40 mg by mouth daily at 6 PM.   Carafate 1 GM/10ML suspension Generic drug: sucralfate Take 1 g by mouth 4 (four) times daily.   dicyclomine 10 MG capsule Commonly known as: BENTYL Take 1 capsule (10 mg total) by mouth 3 (three) times daily before meals.   dronabinol 2.5 MG capsule Commonly known as: MARINOL Take 1 capsule (2.5 mg total) by mouth 2 (two) times daily before a meal.   gabapentin 300 MG capsule Commonly known as: NEURONTIN TAKE 1 CAPSULE BY MOUTH THREE TIMES A DAY   glimepiride 2 MG tablet Commonly known as: AMARYL Take 2 mg by mouth daily with breakfast.   glipiZIDE 5 MG tablet Commonly known as: GLUCOTROL Take 5 mg by mouth 2 (two) times daily.   lactulose 10 GM/15ML solution Commonly known as: CHRONULAC Take 30 mLs (20 g total) by mouth daily.   losartan 50 MG tablet Commonly known as: COZAAR Take 50 mg by mouth daily.   metFORMIN 500 MG tablet Commonly known as: GLUCOPHAGE Take 500 mg by mouth 2 (two) times daily with a meal.   mirabegron ER 50 MG Tb24 tablet Commonly known as: MYRBETRIQ Take 50 mg by mouth daily.   morphine 30 MG 12 hr tablet Commonly known as: MS CONTIN Take  30 mg by mouth 2 (two) times daily.   omeprazole 40 MG capsule Commonly known as: PRILOSEC TAKE 1 CAPSULE BY MOUTH TWICE A DAY   ondansetron 8 MG tablet Commonly known as: ZOFRAN TAKE 1 TABLET(8 MG) BY MOUTH TWICE DAILY AS NEEDED FOR NAUSEA OR VOMITING   OXYCODONE HCL PO Take 40 mg by mouth.   oxyCODONE-acetaminophen 10-325 MG tablet Commonly known as: PERCOCET Take 1 tablet by mouth every 6 (six) hours as needed for pain.   pioglitazone 30 MG tablet Commonly known as: ACTOS Take 30 mg by mouth daily.   prochlorperazine 10 MG tablet Commonly known as: COMPAZINE take 1 tablet by mouth every 6 hours if needed for nausea and vomiting   Restasis 0.05 % ophthalmic  emulsion Generic drug: cycloSPORINE 1 drop 2 (two) times daily.   topiramate 25 MG tablet Commonly known as: TOPAMAX Take 25 mg by mouth daily.   torsemide 20 MG tablet Commonly known as: DEMADEX Take 40 mg by mouth daily.   Trulicity 9.92 EQ/6.8TM Sopn Generic drug: Dulaglutide Inject 0.75 mg into the skin every Saturday.   Vitamin D (Ergocalciferol) 1.25 MG (50000 UNIT) Caps capsule Commonly known as: DRISDOL Take 50,000 Units by mouth every Saturday.       Allergies:  Allergies  Allergen Reactions  . Iodinated Diagnostic Agents Rash    Patient states he was instructed not to take IV contrast.  In 2008 he had an unknown reaction, and was told not to take it again.  He was also told not to take it due to his kidneys.  . Iodine Anxiety, Rash and Other (See Comments)    Didn't feel right "instructed not to take per MD--something with his port"     Past Medical History, Surgical history, Social history, and Family History were reviewed and updated.  Review of Systems: All other 10 point review of systems is negative.   Physical Exam:  vitals were not taken for this visit.   Wt Readings from Last 3 Encounters:  07/06/19 191 lb (86.6 kg)  07/06/19 191 lb (86.6 kg)  06/14/19 185 lb (83.9 kg)    Ocular: Sclerae unicteric, pupils equal, round and reactive to light Ear-nose-throat: Oropharynx clear, dentition fair Lymphatic: No cervical or supraclavicular adenopathy Lungs no rales or rhonchi, good excursion bilaterally Heart regular rate and rhythm, no murmur appreciated Abd soft, nontender, positive bowel sounds, no liver or spleen tip palpated on exam, no fluid wave  MSK no focal spinal tenderness, no joint edema Neuro: non-focal, well-oriented, appropriate affect Breasts: Deferred   Lab Results  Component Value Date   WBC 4.5 07/27/2019   HGB 10.3 (L) 07/27/2019   HCT 32.5 (L) 07/27/2019   MCV 81.9 07/27/2019   PLT 164 07/27/2019   Lab Results  Component  Value Date   FERRITIN 2,248 (H) 06/14/2019   IRON 69 06/14/2019   TIBC 240 06/14/2019   UIBC 171 06/14/2019   IRONPCTSAT 29 06/14/2019   Lab Results  Component Value Date   RETICCTPCT 1.1 07/27/2019   RBC 3.97 (L) 07/27/2019   RBC 3.89 (L) 07/27/2019   RETICCTABS 27.5 03/21/2014   Lab Results  Component Value Date   KPAFRELGTCHN 36.6 (H) 06/14/2019   LAMBDASER 23.8 06/14/2019   KAPLAMBRATIO 1.54 06/14/2019   Lab Results  Component Value Date   IGGSERUM 970 06/14/2019   IGA 178 06/14/2019   IGMSERUM 44 06/14/2019   Lab Results  Component Value Date   TOTALPROTELP 6.8 06/14/2019   ALBUMINELP  3.5 06/14/2019   A1GS 0.2 06/14/2019   A2GS 1.1 (H) 06/14/2019   BETS 1.0 06/14/2019   BETA2SER 0.5 11/07/2014   GAMS 1.0 06/14/2019   MSPIKE Not Observed 06/14/2019   SPEI Comment 05/17/2019     Chemistry      Component Value Date/Time   NA 144 07/06/2019 0822   NA 143 02/22/2017 0744   K 3.4 (L) 07/06/2019 0822   K 4.0 02/22/2017 0744   CL 105 07/06/2019 0822   CL 106 02/22/2017 0744   CO2 30 07/06/2019 0822   CO2 27 02/22/2017 0744   BUN 17 07/06/2019 0822   BUN 26 (H) 02/22/2017 0744   CREATININE 2.11 (H) 07/06/2019 0822   CREATININE 2.3 (H) 02/22/2017 0744      Component Value Date/Time   CALCIUM 9.5 07/06/2019 0822   CALCIUM 9.3 02/22/2017 0744   ALKPHOS 64 07/06/2019 0822   ALKPHOS 80 02/22/2017 0744   AST 17 07/06/2019 0822   ALT 10 07/06/2019 0822   ALT 22 02/22/2017 0744   BILITOT 0.4 07/06/2019 0822       Impression and Plan: [name] is a very pleasant 69 yo African American gentleman with IgG kappa myeloma. We will proceed with treatment today as planned and see him back in another 4 weeks.  He can contact our office with any questions or concerns. We can certainly see him sooner if needed.  Laverna Peace, NP 6/3/20218:30 AM

## 2019-07-27 NOTE — Patient Instructions (Signed)
Implanted Port Insertion, Care After °This sheet gives you information about how to care for yourself after your procedure. Your health care provider may also give you more specific instructions. If you have problems or questions, contact your health care provider. °What can I expect after the procedure? °After the procedure, it is common to have: °· Discomfort at the port insertion site. °· Bruising on the skin over the port. This should improve over 3-4 days. °Follow these instructions at home: °Port care °· After your port is placed, you will get a manufacturer's information card. The card has information about your port. Keep this card with you at all times. °· Take care of the port as told by your health care provider. Ask your health care provider if you or a family member can get training for taking care of the port at home. A home health care nurse may also take care of the port. °· Make sure to remember what type of port you have. °Incision care ° °  ° °· Follow instructions from your health care provider about how to take care of your port insertion site. Make sure you: °? Wash your hands with soap and water before and after you change your bandage (dressing). If soap and water are not available, use hand sanitizer. °? Change your dressing as told by your health care provider. °? Leave stitches (sutures), skin glue, or adhesive strips in place. These skin closures may need to stay in place for 2 weeks or longer. If adhesive strip edges start to loosen and curl up, you may trim the loose edges. Do not remove adhesive strips completely unless your health care provider tells you to do that. °· Check your port insertion site every day for signs of infection. Check for: °? Redness, swelling, or pain. °? Fluid or blood. °? Warmth. °? Pus or a bad smell. °Activity °· Return to your normal activities as told by your health care provider. Ask your health care provider what activities are safe for you. °· Do not  lift anything that is heavier than 10 lb (4.5 kg), or the limit that you are told, until your health care provider says that it is safe. °General instructions °· Take over-the-counter and prescription medicines only as told by your health care provider. °· Do not take baths, swim, or use a hot tub until your health care provider approves. Ask your health care provider if you may take showers. You may only be allowed to take sponge baths. °· Do not drive for 24 hours if you were given a sedative during your procedure. °· Wear a medical alert bracelet in case of an emergency. This will tell any health care providers that you have a port. °· Keep all follow-up visits as told by your health care provider. This is important. °Contact a health care provider if: °· You cannot flush your port with saline as directed, or you cannot draw blood from the port. °· You have a fever or chills. °· You have redness, swelling, or pain around your port insertion site. °· You have fluid or blood coming from your port insertion site. °· Your port insertion site feels warm to the touch. °· You have pus or a bad smell coming from the port insertion site. °Get help right away if: °· You have chest pain or shortness of breath. °· You have bleeding from your port that you cannot control. °Summary °· Take care of the port as told by your health   care provider. Keep the manufacturer's information card with you at all times. °· Change your dressing as told by your health care provider. °· Contact a health care provider if you have a fever or chills or if you have redness, swelling, or pain around your port insertion site. °· Keep all follow-up visits as told by your health care provider. °This information is not intended to replace advice given to you by your health care provider. Make sure you discuss any questions you have with your health care provider. °Document Revised: 09/07/2017 Document Reviewed: 09/07/2017 °Elsevier Patient Education ©  2020 [employer] ° °

## 2019-07-27 NOTE — Telephone Encounter (Signed)
Per verbal from [facility] treatment/visit should be 7/1 instead of 6/24 as los stated. Per 6/3 los

## 2019-07-28 ENCOUNTER — Telehealth: Payer: Self-pay | Admitting: Hematology & Oncology

## 2019-07-28 LAB — IGG, IGA, IGM
IgA: 165 mg/dL (ref 61–437)
IgG (Immunoglobin G), Serum: 958 mg/dL (ref 603–1613)
IgM (Immunoglobulin M), Srm: 39 mg/dL (ref 20–172)

## 2019-07-28 LAB — PROTEIN ELECTROPHORESIS, SERUM
A/G Ratio: 1.3 (ref 0.7–1.7)
Albumin ELP: 3.9 g/dL (ref 2.9–4.4)
Alpha-1-Globulin: 0.2 g/dL (ref 0.0–0.4)
Alpha-2-Globulin: 1.1 g/dL — ABNORMAL HIGH (ref 0.4–1.0)
Beta Globulin: 0.9 g/dL (ref 0.7–1.3)
Gamma Globulin: 0.9 g/dL (ref 0.4–1.8)
Globulin, Total: 3.1 g/dL (ref 2.2–3.9)
Total Protein ELP: 7 g/dL (ref 6.0–8.5)

## 2019-07-28 LAB — KAPPA/LAMBDA LIGHT CHAINS
Kappa free light chain: 43.2 mg/L — ABNORMAL HIGH (ref 3.3–19.4)
Kappa, lambda light chain ratio: 1.79 — ABNORMAL HIGH (ref 0.26–1.65)
Lambda free light chains: 24.1 mg/L (ref 5.7–26.3)

## 2019-07-28 NOTE — Telephone Encounter (Signed)
Faxed medical records to [facility] @ [phone]  Release V.Q:[id number]

## 2019-08-11 ENCOUNTER — Emergency Department (HOSPITAL_COMMUNITY): Payer: 59

## 2019-08-11 ENCOUNTER — Other Ambulatory Visit: Payer: Self-pay

## 2019-08-11 ENCOUNTER — Emergency Department (HOSPITAL_COMMUNITY)
Admission: EM | Admit: 2019-08-11 | Discharge: 2019-08-11 | Disposition: A | Discharge status: Home / Self Care | Payer: 59 | Attending: Emergency Medicine | Admitting: Emergency Medicine

## 2019-08-11 ENCOUNTER — Encounter (HOSPITAL_COMMUNITY): Payer: Self-pay | Admitting: *Deleted

## 2019-08-11 DIAGNOSIS — R519 Headache, unspecified: Secondary | ICD-10-CM

## 2019-08-11 DIAGNOSIS — Z79899 Other long term (current) drug therapy: Secondary | ICD-10-CM | POA: Diagnosis not present

## 2019-08-11 DIAGNOSIS — I10 Essential (primary) hypertension: Secondary | ICD-10-CM | POA: Insufficient documentation

## 2019-08-11 DIAGNOSIS — E039 Hypothyroidism, unspecified: Secondary | ICD-10-CM | POA: Diagnosis not present

## 2019-08-11 DIAGNOSIS — E119 Type 2 diabetes mellitus without complications: Secondary | ICD-10-CM | POA: Insufficient documentation

## 2019-08-11 DIAGNOSIS — Z7984 Long term (current) use of oral hypoglycemic drugs: Secondary | ICD-10-CM | POA: Insufficient documentation

## 2019-08-11 LAB — COMPREHENSIVE METABOLIC PANEL
ALT: 16 U/L (ref 0–44)
AST: 21 U/L (ref 15–41)
Albumin: 3.9 g/dL (ref 3.5–5.0)
Alkaline Phosphatase: 65 U/L (ref 38–126)
Anion gap: 8 (ref 5–15)
BUN: 23 mg/dL (ref 8–23)
CO2: 25 mmol/L (ref 22–32)
Calcium: 8.8 mg/dL — ABNORMAL LOW (ref 8.9–10.3)
Chloride: 105 mmol/L (ref 98–111)
Creatinine, Ser: 1.94 mg/dL — ABNORMAL HIGH (ref 0.61–1.24)
GFR calc Af Amer: 39 mL/min — ABNORMAL LOW (ref >60)
GFR calc non Af Amer: 34 mL/min — ABNORMAL LOW (ref >60)
Glucose, Bld: 101 mg/dL — ABNORMAL HIGH (ref 70–99)
Potassium: 3.7 mmol/L (ref 3.5–5.1)
Sodium: 138 mmol/L (ref 135–145)
Total Bilirubin: 0.5 mg/dL (ref 0.3–1.2)
Total Protein: 6.9 g/dL (ref 6.5–8.1)

## 2019-08-11 LAB — CBC WITH DIFFERENTIAL/PLATELET
Abs Immature Granulocytes: 0.01 10*3/uL (ref 0.00–0.07)
Basophils Absolute: 0 10*3/uL (ref 0.0–0.1)
Basophils Relative: 1 %
Eosinophils Absolute: 0.2 10*3/uL (ref 0.0–0.5)
Eosinophils Relative: 4 %
HCT: 31.2 % — ABNORMAL LOW (ref 39.0–52.0)
Hemoglobin: 10 g/dL — ABNORMAL LOW (ref 13.0–17.0)
Immature Granulocytes: 0 %
Lymphocytes Relative: 15 %
Lymphs Abs: 0.6 10*3/uL — ABNORMAL LOW (ref 0.7–4.0)
MCH: 26.7 pg (ref 26.0–34.0)
MCHC: 32.1 g/dL (ref 30.0–36.0)
MCV: 83.4 fL (ref 80.0–100.0)
Monocytes Absolute: 0.4 10*3/uL (ref 0.1–1.0)
Monocytes Relative: 10 %
Neutro Abs: 2.8 10*3/uL (ref 1.7–7.7)
Neutrophils Relative %: 70 %
Platelets: 172 10*3/uL (ref 150–400)
RBC: 3.74 MIL/uL — ABNORMAL LOW (ref 4.22–5.81)
RDW: 17.1 % — ABNORMAL HIGH (ref 11.5–15.5)
WBC: 3.9 10*3/uL — ABNORMAL LOW (ref 4.0–10.5)
nRBC: 0 % (ref 0.0–0.2)

## 2019-08-11 MED ORDER — MORPHINE SULFATE (PF) 4 MG/ML IV SOLN
4.0000 mg | Freq: Once | INTRAVENOUS | Status: AC
  Administered 2019-08-11: 4 mg via INTRAVENOUS
  Filled 2019-08-11: qty 1

## 2019-08-11 MED ORDER — HEPARIN SOD (PORK) LOCK FLUSH 100 UNIT/ML IV SOLN
500.0000 [IU] | Freq: Once | INTRAVENOUS | Status: AC
  Administered 2019-08-11: 500 [IU]
  Filled 2019-08-11: qty 5

## 2019-08-11 MED ORDER — METOCLOPRAMIDE HCL 5 MG/ML IJ SOLN
5.0000 mg | Freq: Once | INTRAMUSCULAR | Status: AC
  Administered 2019-08-11: 5 mg via INTRAVENOUS
  Filled 2019-08-11: qty 2

## 2019-08-11 MED ORDER — SODIUM CHLORIDE 0.9 % IV SOLN: INTRAVENOUS | Status: DC

## 2019-08-11 NOTE — ED Provider Notes (Signed)
[facility] DEPT Provider Note   CSN: [id number] Arrival date & time: 08/11/19  8315     History Chief Complaint  Patient presents with  . Headache    [name] is a 69 y.o. male.  69 year old male who presents with frontal headache x2 to 3 months.  He denies any nausea or emesis.  Does have a history of multiple myeloma and is being treated with chemotherapy for that.  No visual changes.  No ataxia or new weakness.  Has been seen by his doctor for this and concern for patient is increased blood pressure has been etiology.  Blood pressure medications were adjusted but no change to his symptoms.  States that there has not been any acute changes in the last 24 hours        Past Medical History:  Diagnosis Date  . Anemia   . 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 Anemia, iron deficiency 03/12/2011  . Anxiety   . Anxiety disorder   . Arthralgia of multiple joints 12/31/2010  . Arthritis   . Constipation   . Diabetes mellitus without complication ([place])   . Diabetic neuropathy ([place])   . Diverticulosis   . Epidermoid cyst of skin of chest 08/29/2018  . Fibromyalgia   . GERD (gastroesophageal reflux disease)   . Headache   . Hemorrhoids   . Herpes zoster   . Hyperlipidemia   . Hypertension   . Hypotestosteronism 03/17/2012  . Multiple myeloma   . Multiple myeloma ([place])   . Myeloma ([place]) 12/31/2010  . Nevus of left elbow 08/29/2018  . Pneumonia   . Renal insufficiency   . Type 2 diabetes mellitus ([place])   . Urinary retention     Patient Active Problem List   Diagnosis Date Noted  . Nevus of left elbow 08/29/2018  . Epidermoid cyst of skin of chest 08/29/2018  . Cervical spondylosis with myelopathy 06/05/2016  . Anemia of chronic renal failure 12/17/2015  . Preventive measure 12/17/2015  . Type 2 diabetes mellitus ([place]) 06/05/2015  . Edema extremities 06/05/2015  . Benign hypertension 06/05/2015  . Abdominal pain in male   . Right sided  abdominal pain   . Lower abdominal pain   . Multiple myeloma ([place])   . Acute abdominal pain 11/14/2014  . Generalized abdominal pain   . Esophageal dysphagia   . Hypertensive lower esophageal sphincter   . Dysphagia 09/26/2014  . Rectal bleeding 09/26/2014  . Nausea with vomiting 09/26/2014  . Constipation 09/26/2014  . Hypotestosteronism 03/17/2012  . Can't get food down 01/17/2012  . Hematochezia 07/12/2011  . Anemia, iron deficiency 03/12/2011  . 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 Myeloma ([place]) 12/31/2010  . Diabetes mellitus, type II ([place]) 12/31/2010  . Arthralgia of multiple joints 12/31/2010  . GERD 03/09/2008  . DIVERTICULOSIS-COLON 03/09/2008  . Other dysphagia 03/09/2008    Past Surgical History:  Procedure Laterality Date  . CATARACT EXTRACTION     bi-lateral  . COLONOSCOPY    . COLONOSCOPY  07/13/2011   Procedure: COLONOSCOPY;  Surgeon: Ladene Artist, MD,FACG;  Location: [facility];  Service: Endoscopy;  Laterality: N/A;  . CYST EXCISION Right 08/29/2018   Procedure: CYST REMOVAL;  Surgeon: Fanny Skates, MD;  Location: [facility];  Service: General;  Laterality: Right;  . ESOPHAGOGASTRODUODENOSCOPY    . IR CHEST FLUORO  09/22/2016  . IR FLUORO GUIDE PORT INSERTION RIGHT  09/23/2016  . IR REMOVAL TUN ACCESS W/ PORT  W/O FL MOD SED  09/23/2016  . IR US GUIDE VASC ACCESS LEFT  09/23/2016  . KNEE ARTHROSCOPY     left  . KNEE SURGERY    . NEVUS EXCISION N/A 08/29/2018   Procedure: , NEVUS LEFT ELBOW;  Surgeon: Fanny Skates, MD;  Location: [facility];  Service: General;  Laterality: N/A;  . PORTACATH PLACEMENT    . POSTERIOR CERVICAL FUSION/FORAMINOTOMY N/A 06/05/2016   Procedure: LAMINECTOMY CERVICAL 3 - CERVICAL 6 WITH LATERAL MASS FUSION;  Surgeon: Consuella Lose, MD;  Location: [facility];  Service: Neurosurgery;  Laterality: N/A;  LAMINECTOMY CERVICAL 3 - CERVICAL 6 WITH LATERAL MASS FUSION  . TONSILLECTOMY    . TRANSURETHRAL  RESECTION OF PROSTATE  01/2011   Dr. at [facility] in [place]       Family History  Problem Relation Age of Onset  . Breast cancer Sister   . Breast cancer Mother   . Heart disease Mother   . Diabetes Sister   . Bone cancer Father   . Heart disease Sister   . Heart disease Daughter   . Throat cancer Brother   . Stomach cancer Brother   . Colon cancer Neg Hx     Social History   Tobacco Use  . Smoking status: Never Smoker  . Smokeless tobacco: Never Used  Vaping Use  . Vaping Use: Never used  Substance Use Topics  . Alcohol use: Not Currently    Alcohol/week: 0.0 standard drinks  . Drug use: Not Currently    Comment: nothing in twenty years    Home Medications Prior to Admission medications   Medication Sig Start Date End Date Taking? Authorizing Provider  amLODipine (NORVASC) 10 MG tablet Take 10 mg by mouth 2 (two) times daily. 05/01/19  Yes [provider]  atorvastatin (LIPITOR) 40 MG tablet Take 40 mg by mouth daily at 6 PM.  08/20/18  Yes [provider]  dicyclomine (BENTYL) 10 MG capsule Take 1 capsule (10 mg total) by mouth 3 (three) times daily before meals. 07/25/19  Yes Ennever, Rudell Cobb, MD  gabapentin (NEURONTIN) 300 MG capsule TAKE 1 CAPSULE BY MOUTH THREE TIMES A DAY Patient taking differently: Take 300 mg by mouth 3 (three) times daily.  07/20/19  Yes [place], Holli Humbles, NP  glimepiride (AMARYL) 2 MG tablet Take 2 mg by mouth daily with breakfast.   Yes [provider]  glipiZIDE (GLUCOTROL) 5 MG tablet Take 5 mg by mouth 2 (two) times daily. 06/13/19  Yes [provider]  losartan (COZAAR) 50 MG tablet Take 50 mg by mouth daily. 02/25/19  Yes [provider]  CARAFATE 1 GM/10ML suspension Take 1 g by mouth 4 (four) times daily.  07/28/11   [provider]  dronabinol (MARINOL) 2.5 MG capsule Take 1 capsule (2.5 mg total) by mouth 2 (two) times daily before a meal. 12/09/18   Ennever, Rudell Cobb, MD    lactulose (CHRONULAC) 10 GM/15ML solution Take 30 mLs (20 g total) by mouth daily. 02/02/19   Volanda Napoleon, MD  metoprolol succinate (TOPROL-XL) 25 MG 24 hr tablet Take 25 mg by mouth daily. 07/28/19   [provider]  mirabegron ER (MYRBETRIQ) 50 MG TB24 tablet Take 50 mg by mouth daily.    [provider]  morphine (MS CONTIN) 30 MG 12 hr tablet Take 30 mg by mouth 2 (two) times daily. 06/29/18   [provider]  omeprazole (PRILOSEC) 40 MG capsule TAKE  1 CAPSULE BY MOUTH TWICE A DAY 07/12/19   Volanda Napoleon, MD  ondansetron (ZOFRAN) 8 MG tablet TAKE 1 TABLET(8 MG) BY MOUTH TWICE DAILY AS NEEDED FOR NAUSEA OR VOMITING 06/14/19   [place], Holli Humbles, NP  OXYCODONE HCL PO Take 40 mg by mouth.    [provider]  oxyCODONE-acetaminophen (PERCOCET) 10-325 MG tablet Take 1 tablet by mouth every 6 (six) hours as needed for pain.    [provider]  pioglitazone (ACTOS) 30 MG tablet Take 30 mg by mouth daily. 03/06/18   [provider]  prochlorperazine (COMPAZINE) 10 MG tablet take 1 tablet by mouth every 6 hours if needed for nausea and vomiting 02/27/19   Ennever, Rudell Cobb, MD  RESTASIS 0.05 % ophthalmic emulsion 1 drop 2 (two) times daily. 05/30/19   [provider]  topiramate (TOPAMAX) 25 MG tablet Take 25 mg by mouth daily. 03/13/19   [provider]  torsemide (DEMADEX) 20 MG tablet Take 40 mg by mouth daily.  01/12/19   [provider]  TRULICITY 9.76 BH/4.1PF SOPN Inject 0.75 mg into the skin every Saturday.  04/19/19   [provider]  Vitamin D, Ergocalciferol, (DRISDOL) 50000 UNITS CAPS capsule Take 50,000 Units by mouth every Saturday.     [provider]  mesalamine (LIALDA) 1.2 g EC tablet Take 1 tablet (1.2 g total) by mouth daily with breakfast. Patient not taking: Reported on 05/01/2019 07/04/18 05/01/19  [place], Holli Humbles, NP  potassium chloride (KLOR-CON) 20 MEQ packet Take 20 mEq by mouth  daily.   05/17/11  [provider]  potassium chloride SA (K-DUR,KLOR-CON) 20 MEQ tablet Take 1 tablet (20 mEq total) by mouth 2 (two) times daily. Patient not taking: Reported on 05/01/2019 11/07/14 05/01/19  Volanda Napoleon, MD    Allergies    Iodinated diagnostic agents and Iodine  Review of Systems   Review of Systems  All other systems reviewed and are negative.   Physical Exam Updated Vital Signs BP (!) 183/85 (BP Location: Left Arm)   Pulse 100   Temp 98.5 F (36.9 C) (Oral)   Resp 16   Ht 1.803 m (_0 )   Wt 87.5 kg   SpO2 99%   BMI 26.89 kg/m   Physical Exam Vitals and nursing note reviewed.  Constitutional:      General: He is not in acute distress.    Appearance: Normal appearance. He is well-developed. He is not toxic-appearing.  HENT:     Head: Normocephalic and atraumatic.  Eyes:     General: Lids are normal.     Conjunctiva/sclera: Conjunctivae normal.     Pupils: Pupils are equal, round, and reactive to light.  Neck:     Thyroid: No thyroid mass.     Trachea: No tracheal deviation.  Cardiovascular:     Rate and Rhythm: Normal rate and regular rhythm.     Heart sounds: Normal heart sounds. No murmur heard.  No gallop.   Pulmonary:     Effort: Pulmonary effort is normal. No respiratory distress.     Breath sounds: Normal breath sounds. No stridor. No decreased breath sounds, wheezing, rhonchi or rales.  Abdominal:     General: Bowel sounds are normal. There is no distension.     Palpations: Abdomen is soft.     Tenderness: There is no abdominal tenderness. There is no rebound.  Musculoskeletal:        General: No tenderness. Normal range of motion.  Cervical back: Normal range of motion and neck supple.  Skin:    General: Skin is warm and dry.     Findings: No abrasion or rash.  Neurological:     General: No focal deficit present.     Mental Status: He is alert and oriented to person, place, and time.     GCS: GCS eye subscore is 4.  GCS verbal subscore is 5. GCS motor subscore is 6.     Cranial Nerves: No cranial nerve deficit.     Sensory: No sensory deficit.     Motor: No weakness or tremor.  Psychiatric:        Speech: Speech normal.        Behavior: Behavior normal.     ED Results / Procedures / Treatments   Labs (all labs ordered are listed, but only abnormal results are displayed) Labs Reviewed  CBC WITH DIFFERENTIAL/PLATELET  COMPREHENSIVE METABOLIC PANEL    EKG None  Radiology No results found.  Procedures Procedures (including critical care time)  Medications Ordered in ED Medications  0.9 %  sodium chloride infusion (has no administration in time range)  metoCLOPramide (REGLAN) injection 5 mg (has no administration in time range)    ED Course  I have reviewed the triage vital signs and the nursing notes.  Pertinent labs & imaging results that were available during my care of the patient were reviewed by me and considered in my medical decision making (see chart for details).    MDM Rules/Calculators/A&P                         Patient's headache treated here and he does feel somewhat better.  Head CT negative.  Labs are reassuring.  Will be discharged home with return precautions Final Clinical Impression(s) / ED Diagnoses Final diagnoses:  None    Rx / DC Orders ED Discharge Orders    None       Lacretia Leigh, MD 08/11/19 1139

## 2019-08-11 NOTE — ED Triage Notes (Signed)
Pt states he has a headache for about 2-3 months, PCP has adjusted bp meds without noted relief

## 2019-08-12 ENCOUNTER — Encounter (HOSPITAL_COMMUNITY): Payer: Self-pay | Admitting: *Deleted

## 2019-08-12 ENCOUNTER — Other Ambulatory Visit: Payer: Self-pay

## 2019-08-12 ENCOUNTER — Emergency Department (HOSPITAL_COMMUNITY)
Admission: EM | Admit: 2019-08-12 | Discharge: 2019-08-12 | Disposition: A | Discharge status: Home / Self Care | Payer: 59 | Attending: Emergency Medicine | Admitting: Emergency Medicine

## 2019-08-12 DIAGNOSIS — M2550 Pain in unspecified joint: Secondary | ICD-10-CM | POA: Insufficient documentation

## 2019-08-12 DIAGNOSIS — Z5321 Procedure and treatment not carried out due to patient leaving prior to being seen by health care provider: Secondary | ICD-10-CM | POA: Diagnosis not present

## 2019-08-12 DIAGNOSIS — R519 Headache, unspecified: Secondary | ICD-10-CM | POA: Diagnosis not present

## 2019-08-12 NOTE — ED Triage Notes (Signed)
Pt seen yesterday for headache, today returns due to no relief and having joint pain

## 2019-08-12 NOTE — ED Notes (Signed)
Pt stated to staff he is going to [facility]

## 2019-08-24 ENCOUNTER — Inpatient Hospital Stay: Payer: 59 | Attending: Hematology & Oncology | Admitting: Hematology & Oncology

## 2019-08-24 ENCOUNTER — Other Ambulatory Visit: Payer: Self-pay

## 2019-08-24 ENCOUNTER — Inpatient Hospital Stay: Payer: 59

## 2019-08-24 ENCOUNTER — Encounter: Payer: Self-pay | Admitting: Hematology & Oncology

## 2019-08-24 ENCOUNTER — Other Ambulatory Visit: Payer: Self-pay | Admitting: *Deleted

## 2019-08-24 VITALS — BP 139/69 | HR 96 | Temp 97.3°F | Resp 18 | Wt 185.0 lb

## 2019-08-24 DIAGNOSIS — D631 Anemia in chronic kidney disease: Secondary | ICD-10-CM | POA: Diagnosis not present

## 2019-08-24 DIAGNOSIS — Z5111 Encounter for antineoplastic chemotherapy: Secondary | ICD-10-CM | POA: Insufficient documentation

## 2019-08-24 DIAGNOSIS — D508 Other iron deficiency anemias: Secondary | ICD-10-CM

## 2019-08-24 DIAGNOSIS — E119 Type 2 diabetes mellitus without complications: Secondary | ICD-10-CM | POA: Diagnosis not present

## 2019-08-24 DIAGNOSIS — C9000 Multiple myeloma not having achieved remission: Secondary | ICD-10-CM | POA: Diagnosis present

## 2019-08-24 DIAGNOSIS — D500 Iron deficiency anemia secondary to blood loss (chronic): Secondary | ICD-10-CM

## 2019-08-24 DIAGNOSIS — Z5112 Encounter for antineoplastic immunotherapy: Secondary | ICD-10-CM | POA: Diagnosis present

## 2019-08-24 DIAGNOSIS — N189 Chronic kidney disease, unspecified: Secondary | ICD-10-CM | POA: Diagnosis not present

## 2019-08-24 DIAGNOSIS — E349 Endocrine disorder, unspecified: Secondary | ICD-10-CM

## 2019-08-24 DIAGNOSIS — Z299 Encounter for prophylactic measures, unspecified: Secondary | ICD-10-CM

## 2019-08-24 DIAGNOSIS — G894 Chronic pain syndrome: Secondary | ICD-10-CM

## 2019-08-24 LAB — CMP (CANCER CENTER ONLY)
ALT: 18 U/L (ref 0–44)
AST: 19 U/L (ref 15–41)
Albumin: 4.6 g/dL (ref 3.5–5.0)
Alkaline Phosphatase: 68 U/L (ref 38–126)
Anion gap: 9 (ref 5–15)
BUN: 33 mg/dL — ABNORMAL HIGH (ref 8–23)
CO2: 28 mmol/L (ref 22–32)
Calcium: 9.3 mg/dL (ref 8.9–10.3)
Chloride: 102 mmol/L (ref 98–111)
Creatinine: 2.35 mg/dL — ABNORMAL HIGH (ref 0.61–1.24)
GFR, Est AFR Am: 31 mL/min — ABNORMAL LOW (ref >60)
GFR, Estimated: 27 mL/min — ABNORMAL LOW (ref >60)
Glucose, Bld: 90 mg/dL (ref 70–99)
Potassium: 3.6 mmol/L (ref 3.5–5.1)
Sodium: 139 mmol/L (ref 135–145)
Total Bilirubin: 0.5 mg/dL (ref 0.3–1.2)
Total Protein: 7.3 g/dL (ref 6.5–8.1)

## 2019-08-24 LAB — CBC WITH DIFFERENTIAL (CANCER CENTER ONLY)
Abs Immature Granulocytes: 0.03 10*3/uL (ref 0.00–0.07)
Basophils Absolute: 0.1 10*3/uL (ref 0.0–0.1)
Basophils Relative: 1 %
Eosinophils Absolute: 0.1 10*3/uL (ref 0.0–0.5)
Eosinophils Relative: 2 %
HCT: 30.6 % — ABNORMAL LOW (ref 39.0–52.0)
Hemoglobin: 10 g/dL — ABNORMAL LOW (ref 13.0–17.0)
Immature Granulocytes: 1 %
Lymphocytes Relative: 18 %
Lymphs Abs: 1 10*3/uL (ref 0.7–4.0)
MCH: 26.7 pg (ref 26.0–34.0)
MCHC: 32.7 g/dL (ref 30.0–36.0)
MCV: 81.6 fL (ref 80.0–100.0)
Monocytes Absolute: 0.7 10*3/uL (ref 0.1–1.0)
Monocytes Relative: 13 %
Neutro Abs: 3.8 10*3/uL (ref 1.7–7.7)
Neutrophils Relative %: 65 %
Platelet Count: 168 10*3/uL (ref 150–400)
RBC: 3.75 MIL/uL — ABNORMAL LOW (ref 4.22–5.81)
RDW: 16.2 % — ABNORMAL HIGH (ref 11.5–15.5)
WBC Count: 5.7 10*3/uL (ref 4.0–10.5)
nRBC: 0 % (ref 0.0–0.2)

## 2019-08-24 LAB — IRON AND TIBC
Iron: 93 ug/dL (ref 42–163)
Saturation Ratios: 35 % (ref 20–55)
TIBC: 265 ug/dL (ref 202–409)
UIBC: 172 ug/dL (ref 117–376)

## 2019-08-24 LAB — FERRITIN: Ferritin: 2006 ng/mL — ABNORMAL HIGH (ref 24–336)

## 2019-08-24 MED ORDER — HYDROMORPHONE HCL 1 MG/ML IJ SOLN
INTRAMUSCULAR | Status: AC
  Filled 2019-08-24: qty 2

## 2019-08-24 MED ORDER — HYDROMORPHONE HCL 1 MG/ML IJ SOLN: 2.0000 mg | Freq: Once | INTRAMUSCULAR | Status: DC

## 2019-08-24 MED ORDER — ONDANSETRON HCL 8 MG PO TABS: ORAL_TABLET | ORAL | 3 refills | Status: DC

## 2019-08-24 MED ORDER — SODIUM CHLORIDE 0.9 % IV SOLN
300.0000 mg/m2 | Freq: Once | INTRAVENOUS | Status: AC
  Administered 2019-08-24: 620 mg via INTRAVENOUS
  Filled 2019-08-24: qty 31

## 2019-08-24 MED ORDER — SODIUM CHLORIDE 0.9% FLUSH
10.0000 mL | INTRAVENOUS | Status: DC | PRN
  Administered 2019-08-24: 10 mL
  Filled 2019-08-24: qty 10

## 2019-08-24 MED ORDER — DEXTROSE 5 % IV SOLN
29.0000 mg/m2 | Freq: Once | INTRAVENOUS | Status: AC
  Administered 2019-08-24: 60 mg via INTRAVENOUS
  Filled 2019-08-24: qty 30

## 2019-08-24 MED ORDER — HEPARIN SOD (PORK) LOCK FLUSH 100 UNIT/ML IV SOLN
500.0000 [IU] | Freq: Once | INTRAVENOUS | Status: AC | PRN
  Administered 2019-08-24: 500 [IU]
  Filled 2019-08-24: qty 5

## 2019-08-24 MED ORDER — SODIUM CHLORIDE 0.9 % IV SOLN
10.0000 mg | Freq: Once | INTRAVENOUS | Status: AC
  Administered 2019-08-24: 10 mg via INTRAVENOUS
  Filled 2019-08-24: qty 10

## 2019-08-24 MED ORDER — TESTOSTERONE CYPIONATE 200 MG/ML IM SOLN
INTRAMUSCULAR | Status: AC
  Filled 2019-08-24: qty 2

## 2019-08-24 MED ORDER — SODIUM CHLORIDE 0.9 % IV SOLN
Freq: Once | INTRAVENOUS | Status: AC
  Filled 2019-08-24: qty 250

## 2019-08-24 MED ORDER — PALONOSETRON HCL INJECTION 0.25 MG/5ML
INTRAVENOUS | Status: AC
  Filled 2019-08-24: qty 5

## 2019-08-24 MED ORDER — HYDROMORPHONE HCL 1 MG/ML IJ SOLN
2.0000 mg | Freq: Once | INTRAMUSCULAR | Status: AC
  Administered 2019-08-24: 2 mg via INTRAVENOUS

## 2019-08-24 MED ORDER — PALONOSETRON HCL INJECTION 0.25 MG/5ML
0.2500 mg | Freq: Once | INTRAVENOUS | Status: AC
  Administered 2019-08-24: 0.25 mg via INTRAVENOUS

## 2019-08-24 MED ORDER — TESTOSTERONE CYPIONATE 200 MG/ML IM SOLN
400.0000 mg | Freq: Once | INTRAMUSCULAR | Status: AC
  Administered 2019-08-24: 400 mg via INTRAMUSCULAR

## 2019-08-24 NOTE — Progress Notes (Signed)
Hematology and Oncology Follow Up Visit  [name] [id number] 1950/10/19 69 y.o. 08/24/2019   Principle Diagnosis:  IgG kappa myeloma  Anemia secondary to renal insufficiency  Intermittent iron - deficiency anemia  Hypotestosteronemia  Current Therapy: Aredia 60 mg IV q 3 months-- next dose09/2021 Aranesp 300 mcg subcu as needed for hemoglobin less than 11 DepoTestosterone400 mg q4weeks IV iron as indicated Kyprolis/Cytoxan - s/p cycle23   Interim History:  [name] is here today for follow-up and treatment.  The problem that he has been having his headaches.  He actually went to the emergency room a couple weeks ago.  He had a CT scan of the head which was unremarkable.  He saw his family doctor yesterday.  She put him on some medication.  He is not sure what she put him on.  He was referred to a headache neurologist.  I am not sure when he sees him.  Otherwise, he is about the same.  He still has the myalgias and arthralgias.  There has been no problems with fever.  Has had no cough.  Is had no nausea or vomiting.  There is been no change in bowel or bladder habits.  His last monoclonal studies did not show monoclonal spike in early June.  His IgG level was 160 mg/dL.  His kappa light chain is 4.3 mg/dL.  Currently, his performance status is ECOG 1.  Medications:  Allergies as of 08/24/2019      Reactions   Iodinated Diagnostic Agents Rash   Patient states he was instructed not to take IV contrast.  In 2008 he had an unknown reaction, and was told not to take it again.  He was also told not to take it due to his kidneys.   Iodine Anxiety, Rash, Other (See Comments)   Didn't feel right "instructed not to take per MD--something with his port"      Medication List       Accurate as of August 24, 2019  8:47 AM. If you have any questions, ask your nurse or doctor.        amLODipine 5 MG tablet Commonly known as: NORVASC Take 5 mg by mouth 2 (two)  times daily. What changed: Another medication with the same name was removed. Continue taking this medication, and follow the directions you see here. Changed by: Volanda Napoleon, MD   atorvastatin 40 MG tablet Commonly known as: LIPITOR Take 40 mg by mouth daily at 6 PM.   Carafate 1 GM/10ML suspension Generic drug: sucralfate Take 1 g by mouth 4 (four) times daily.   dicyclomine 10 MG capsule Commonly known as: BENTYL Take 1 capsule (10 mg total) by mouth 3 (three) times daily before meals.   gabapentin 300 MG capsule Commonly known as: NEURONTIN TAKE 1 CAPSULE BY MOUTH THREE TIMES A DAY What changed:   how much to take  how to take this  when to take this  additional instructions   glimepiride 2 MG tablet Commonly known as: AMARYL Take 2 mg by mouth daily with breakfast.   lactulose 10 GM/15ML solution Commonly known as: CHRONULAC Take 30 mLs (20 g total) by mouth daily.   losartan 50 MG tablet Commonly known as: COZAAR Take 50 mg by mouth daily.   metoprolol succinate 25 MG 24 hr tablet Commonly known as: TOPROL-XL Take 25 mg by mouth daily.   mirabegron ER 50 MG Tb24 tablet Commonly known as: MYRBETRIQ Take 50 mg by mouth daily.  morphine 30 MG 12 hr tablet Commonly known as: MS CONTIN Take 30 mg by mouth 2 (two) times daily.   omeprazole 40 MG capsule Commonly known as: PRILOSEC TAKE 1 CAPSULE BY MOUTH TWICE A DAY What changed: when to take this   ondansetron 8 MG tablet Commonly known as: ZOFRAN TAKE 1 TABLET(8 MG) BY MOUTH TWICE DAILY AS NEEDED FOR NAUSEA OR VOMITING What changed:   how much to take  how to take this  when to take this  reasons to take this  additional instructions   oxyCODONE-acetaminophen 10-325 MG tablet Commonly known as: PERCOCET Take 1 tablet by mouth every 6 (six) hours as needed for pain.   pioglitazone 30 MG tablet Commonly known as: ACTOS Take 30 mg by mouth daily.   prochlorperazine 10 MG  tablet Commonly known as: COMPAZINE take 1 tablet by mouth every 6 hours if needed for nausea and vomiting   Restasis 0.05 % ophthalmic emulsion Generic drug: cycloSPORINE Place 1 drop into both eyes daily.   SUMAtriptan 100 MG tablet Commonly known as: IMITREX Take 100 mg by mouth daily.   topiramate 25 MG tablet Commonly known as: TOPAMAX Take 25 mg by mouth daily.   torsemide 20 MG tablet Commonly known as: DEMADEX Take 40 mg by mouth daily.   Trulicity 2.83 TD/1.7OH Sopn Generic drug: Dulaglutide Inject 0.75 mg into the skin every Saturday.   Vitamin D (Ergocalciferol) 1.25 MG (50000 UNIT) Caps capsule Commonly known as: DRISDOL Take 50,000 Units by mouth every Saturday.       Allergies:  Allergies  Allergen Reactions  . Iodinated Diagnostic Agents Rash    Patient states he was instructed not to take IV contrast.  In 2008 he had an unknown reaction, and was told not to take it again.  He was also told not to take it due to his kidneys.  . Iodine Anxiety, Rash and Other (See Comments)    Didn't feel right "instructed not to take per MD--something with his port"     Past Medical History, Surgical history, Social history, and Family History were reviewed and updated.  Review of Systems: Review of Systems  Constitutional: Positive for malaise/fatigue.  HENT: Negative.   Eyes: Negative.   Respiratory: Negative.   Cardiovascular: Negative.   Gastrointestinal: Negative.   Genitourinary: Negative.   Musculoskeletal: Positive for joint pain, myalgias and neck pain.  Skin: Negative.   Neurological: Positive for headaches.  Endo/Heme/Allergies: Negative.   Psychiatric/Behavioral: Negative.      Physical Exam:  weight is 185 lb (83.9 kg). His temporal temperature is 97.3 F (36.3 C) (abnormal). His blood pressure is 139/69 and his pulse is 96. His respiration is 18 and oxygen saturation is 100%.   Wt Readings from Last 3 Encounters:  08/24/19 185 lb (83.9 kg)   08/12/19 192 lb 12.8 oz (87.5 kg)  08/11/19 192 lb 12.8 oz (87.5 kg)    Physical Exam Vitals reviewed.  HENT:     Head: Normocephalic and atraumatic.  Eyes:     Pupils: Pupils are equal, round, and reactive to light.  Cardiovascular:     Rate and Rhythm: Normal rate and regular rhythm.     Heart sounds: Normal heart sounds.  Pulmonary:     Effort: Pulmonary effort is normal.     Breath sounds: Normal breath sounds.  Abdominal:     General: Bowel sounds are normal.     Palpations: Abdomen is soft.  Musculoskeletal:  General: No tenderness or deformity. Normal range of motion.     Cervical back: Normal range of motion.  Lymphadenopathy:     Cervical: No cervical adenopathy.  Skin:    General: Skin is warm and dry.     Findings: No erythema or rash.  Neurological:     Mental Status: He is alert and oriented to person, place, and time.  Psychiatric:        Behavior: Behavior normal.        Thought Content: Thought content normal.        Judgment: Judgment normal.      Lab Results  Component Value Date   WBC 5.7 08/24/2019   HGB 10.0 (L) 08/24/2019   HCT 30.6 (L) 08/24/2019   MCV 81.6 08/24/2019   PLT 168 08/24/2019   Lab Results  Component Value Date   FERRITIN 2,134 (H) 07/27/2019   IRON 65 07/27/2019   TIBC 273 07/27/2019   UIBC 208 07/27/2019   IRONPCTSAT 24 07/27/2019   Lab Results  Component Value Date   RETICCTPCT 1.1 07/27/2019   RBC 3.75 (L) 08/24/2019   RETICCTABS 27.5 03/21/2014   Lab Results  Component Value Date   KPAFRELGTCHN 43.2 (H) 07/27/2019   LAMBDASER 24.1 07/27/2019   KAPLAMBRATIO 1.79 (H) 07/27/2019   Lab Results  Component Value Date   IGGSERUM 958 07/27/2019   IGA 165 07/27/2019   IGMSERUM 39 07/27/2019   Lab Results  Component Value Date   TOTALPROTELP 7.0 07/27/2019   ALBUMINELP 3.9 07/27/2019   A1GS 0.2 07/27/2019   A2GS 1.1 (H) 07/27/2019   BETS 0.9 07/27/2019   BETA2SER 0.5 11/07/2014   GAMS 0.9 07/27/2019    MSPIKE Not Observed 07/27/2019   SPEI Comment 07/27/2019     Chemistry      Component Value Date/Time   NA 138 08/11/2019 1002   NA 143 02/22/2017 0744   K 3.7 08/11/2019 1002   K 4.0 02/22/2017 0744   CL 105 08/11/2019 1002   CL 106 02/22/2017 0744   CO2 25 08/11/2019 1002   CO2 27 02/22/2017 0744   BUN 23 08/11/2019 1002   BUN 26 (H) 02/22/2017 0744   CREATININE 1.94 (H) 08/11/2019 1002   CREATININE 2.79 (H) 07/27/2019 0800   CREATININE 2.3 (H) 02/22/2017 0744      Component Value Date/Time   CALCIUM 8.8 (L) 08/11/2019 1002   CALCIUM 9.3 02/22/2017 0744   ALKPHOS 65 08/11/2019 1002   ALKPHOS 80 02/22/2017 0744   AST 21 08/11/2019 1002   AST 20 07/27/2019 0800   ALT 16 08/11/2019 1002   ALT 11 07/27/2019 0800   ALT 22 02/22/2017 0744   BILITOT 0.5 08/11/2019 1002   BILITOT 0.4 07/27/2019 0800       Impression and Plan: [name] is a very pleasant 68 yo African American gentleman with IgG kappa myeloma.  As far as the myeloma is concerned, he is doing well with this.  I am not sure why he has the headaches.  Maybe, the neurologist will be able to help with that.  We will go ahead with his treatments.  I think if his monoclonal studies still are in remission, we might build to move his treatments out a little bit longer.  We will plan for a follow-up in another month. We will proceed with treatment today as planned and see him back in another 4 weeks.  He can contact our office with any questions or concerns. We can  certainly see him sooner if needed.  Volanda Napoleon, MD 7/1/20218:47 AM

## 2019-08-24 NOTE — Progress Notes (Signed)
Okay to treat today with SCr 2.35 per Dr. [name].

## 2019-08-24 NOTE — Patient Instructions (Signed)

## 2019-08-25 ENCOUNTER — Other Ambulatory Visit: Payer: Self-pay | Admitting: Family

## 2019-08-25 LAB — IGG, IGA, IGM
IgA: 175 mg/dL (ref 61–437)
IgG (Immunoglobin G), Serum: 987 mg/dL (ref 603–1613)
IgM (Immunoglobulin M), Srm: 36 mg/dL (ref 20–172)

## 2019-08-25 LAB — PROTEIN ELECTROPHORESIS, SERUM
A/G Ratio: 1.4 (ref 0.7–1.7)
Albumin ELP: 4.1 g/dL (ref 2.9–4.4)
Alpha-1-Globulin: 0.2 g/dL (ref 0.0–0.4)
Alpha-2-Globulin: 1 g/dL (ref 0.4–1.0)
Beta Globulin: 0.9 g/dL (ref 0.7–1.3)
Gamma Globulin: 0.9 g/dL (ref 0.4–1.8)
Globulin, Total: 3 g/dL (ref 2.2–3.9)
Total Protein ELP: 7.1 g/dL (ref 6.0–8.5)

## 2019-08-25 LAB — KAPPA/LAMBDA LIGHT CHAINS
Kappa free light chain: 27.6 mg/L — ABNORMAL HIGH (ref 3.3–19.4)
Kappa, lambda light chain ratio: 1.65 (ref 0.26–1.65)
Lambda free light chains: 16.7 mg/L (ref 5.7–26.3)

## 2019-09-07 ENCOUNTER — Ambulatory Visit: Payer: 59

## 2019-09-07 ENCOUNTER — Other Ambulatory Visit: Payer: 59

## 2019-09-07 ENCOUNTER — Ambulatory Visit: Payer: 59 | Admitting: Hematology & Oncology

## 2019-09-22 ENCOUNTER — Inpatient Hospital Stay: Payer: 59

## 2019-09-22 ENCOUNTER — Inpatient Hospital Stay (HOSPITAL_BASED_OUTPATIENT_CLINIC_OR_DEPARTMENT_OTHER): Payer: 59 | Admitting: Hematology & Oncology

## 2019-09-22 ENCOUNTER — Telehealth: Payer: Self-pay | Admitting: *Deleted

## 2019-09-22 ENCOUNTER — Other Ambulatory Visit: Payer: Self-pay

## 2019-09-22 ENCOUNTER — Encounter: Payer: Self-pay | Admitting: Hematology & Oncology

## 2019-09-22 VITALS — BP 124/60 | HR 84 | Temp 98.8°F | Resp 18 | Wt 191.0 lb

## 2019-09-22 DIAGNOSIS — D500 Iron deficiency anemia secondary to blood loss (chronic): Secondary | ICD-10-CM | POA: Diagnosis not present

## 2019-09-22 DIAGNOSIS — Z299 Encounter for prophylactic measures, unspecified: Secondary | ICD-10-CM

## 2019-09-22 DIAGNOSIS — Z5112 Encounter for antineoplastic immunotherapy: Secondary | ICD-10-CM | POA: Diagnosis not present

## 2019-09-22 DIAGNOSIS — C9000 Multiple myeloma not having achieved remission: Secondary | ICD-10-CM

## 2019-09-22 DIAGNOSIS — E349 Endocrine disorder, unspecified: Secondary | ICD-10-CM

## 2019-09-22 DIAGNOSIS — D631 Anemia in chronic kidney disease: Secondary | ICD-10-CM

## 2019-09-22 LAB — CMP (CANCER CENTER ONLY)
ALT: 12 U/L (ref 0–44)
AST: 24 U/L (ref 15–41)
Albumin: 4.4 g/dL (ref 3.5–5.0)
Alkaline Phosphatase: 54 U/L (ref 38–126)
Anion gap: 10 (ref 5–15)
BUN: 31 mg/dL — ABNORMAL HIGH (ref 8–23)
CO2: 28 mmol/L (ref 22–32)
Calcium: 9.3 mg/dL (ref 8.9–10.3)
Chloride: 101 mmol/L (ref 98–111)
Creatinine: 3.74 mg/dL (ref 0.61–1.24)
GFR, Est AFR Am: 18 mL/min — ABNORMAL LOW (ref >60)
GFR, Estimated: 15 mL/min — ABNORMAL LOW (ref >60)
Glucose, Bld: 154 mg/dL — ABNORMAL HIGH (ref 70–99)
Potassium: 3.3 mmol/L — ABNORMAL LOW (ref 3.5–5.1)
Sodium: 139 mmol/L (ref 135–145)
Total Bilirubin: 0.5 mg/dL (ref 0.3–1.2)
Total Protein: 6.9 g/dL (ref 6.5–8.1)

## 2019-09-22 LAB — CBC WITH DIFFERENTIAL (CANCER CENTER ONLY)
Abs Immature Granulocytes: 0.02 10*3/uL (ref 0.00–0.07)
Basophils Absolute: 0 10*3/uL (ref 0.0–0.1)
Basophils Relative: 1 %
Eosinophils Absolute: 0.1 10*3/uL (ref 0.0–0.5)
Eosinophils Relative: 2 %
HCT: 27.1 % — ABNORMAL LOW (ref 39.0–52.0)
Hemoglobin: 8.9 g/dL — ABNORMAL LOW (ref 13.0–17.0)
Immature Granulocytes: 1 %
Lymphocytes Relative: 17 %
Lymphs Abs: 0.7 10*3/uL (ref 0.7–4.0)
MCH: 26.8 pg (ref 26.0–34.0)
MCHC: 32.8 g/dL (ref 30.0–36.0)
MCV: 81.6 fL (ref 80.0–100.0)
Monocytes Absolute: 0.5 10*3/uL (ref 0.1–1.0)
Monocytes Relative: 11 %
Neutro Abs: 3 10*3/uL (ref 1.7–7.7)
Neutrophils Relative %: 68 %
Platelet Count: 160 10*3/uL (ref 150–400)
RBC: 3.32 MIL/uL — ABNORMAL LOW (ref 4.22–5.81)
RDW: 16.4 % — ABNORMAL HIGH (ref 11.5–15.5)
WBC Count: 4.4 10*3/uL (ref 4.0–10.5)
nRBC: 0 % (ref 0.0–0.2)

## 2019-09-22 LAB — IRON AND TIBC
Iron: 43 ug/dL (ref 42–163)
Saturation Ratios: 17 % — ABNORMAL LOW (ref 20–55)
TIBC: 249 ug/dL (ref 202–409)
UIBC: 205 ug/dL (ref 117–376)

## 2019-09-22 LAB — FERRITIN: Ferritin: 1215 ng/mL — ABNORMAL HIGH (ref 24–336)

## 2019-09-22 MED ORDER — HYDROMORPHONE HCL 1 MG/ML IJ SOLN
INTRAMUSCULAR | Status: AC
  Filled 2019-09-22: qty 2

## 2019-09-22 MED ORDER — EPOETIN ALFA-EPBX 40000 UNIT/ML IJ SOLN
INTRAMUSCULAR | Status: AC
  Filled 2019-09-22: qty 1

## 2019-09-22 MED ORDER — HYDROMORPHONE HCL 1 MG/ML IJ SOLN
2.0000 mg | Freq: Once | INTRAMUSCULAR | Status: AC
  Administered 2019-09-22: 2 mg via INTRAVENOUS

## 2019-09-22 MED ORDER — EPOETIN ALFA-EPBX 40000 UNIT/ML IJ SOLN
40000.0000 [IU] | Freq: Once | INTRAMUSCULAR | Status: AC
  Administered 2019-09-22: 40000 [IU] via SUBCUTANEOUS

## 2019-09-22 MED ORDER — SODIUM CHLORIDE 0.9 % IV SOLN
Freq: Once | INTRAVENOUS | Status: AC
  Filled 2019-09-22: qty 250

## 2019-09-22 NOTE — Patient Instructions (Signed)

## 2019-09-22 NOTE — Patient Instructions (Signed)
Dehydration, Adult Dehydration is a condition in which there is not enough water or other fluids in the body. This happens when a person loses more fluids than he or she takes in. Important organs, such as the kidneys, brain, and heart, cannot function without a proper amount of fluids. Any loss of fluids from the body can lead to dehydration. Dehydration can be mild, moderate, or severe. It should be treated right away to prevent it from becoming severe. What are the causes? Dehydration may be caused by:  Conditions that cause loss of water or other fluids, such as diarrhea, vomiting, or sweating or urinating a lot.  Not drinking enough fluids, especially when you are ill or doing activities that require a lot of energy.  Other illnesses and conditions, such as fever or infection.  Certain medicines, such as medicines that remove excess fluid from the body (diuretics).  Lack of safe drinking water.  Not being able to get enough water and food. What increases the risk? The following factors may make you more likely to develop this condition:  Having a long-term (chronic) illness that has not been treated properly, such as diabetes, heart disease, or kidney disease.  Being 65 years of age or older.  Having a disability.  Living in a place that is high in altitude, where thinner, drier air causes more fluid loss.  Doing exercises that put stress on your body for a long time (endurance sports). What are the signs or symptoms? Symptoms of dehydration depend on how severe it is. Mild or moderate dehydration  Thirst.  Dry lips or dry mouth.  Dizziness or light-headedness, especially when standing up from a seated position.  Muscle cramps.  Dark urine. Urine may be the color of tea.  Less urine or tears produced than usual.  Headache. Severe dehydration  Changes in skin. Your skin may be cold and clammy, blotchy, or pale. Your skin also may not return to normal after being  lightly pinched and released.  Little or no tears, urine, or sweat.  Changes in vital signs, such as rapid breathing and low blood pressure. Your pulse may be weak or may be faster than 100 beats a minute when you are sitting still.  Other changes, such as: ? Feeling very thirsty. ? Sunken eyes. ? Cold hands and feet. ? Confusion. ? Being very tired (lethargic) or having trouble waking from sleep. ? Short-term weight loss. ? Loss of consciousness. How is this diagnosed? This condition is diagnosed based on your symptoms and a physical exam. You may have blood and urine tests to help confirm the diagnosis. How is this treated? Treatment for this condition depends on how severe it is. Treatment should be started right away. Do not wait until dehydration becomes severe. Severe dehydration is an emergency and needs to be treated in a hospital.  Mild or moderate dehydration can be treated at home. You may be asked to: ? Drink more fluids. ? Drink an oral rehydration solution (ORS). This drink helps restore proper amounts of fluids and salts and minerals in the blood (electrolytes).  Severe dehydration can be treated: ? With IV fluids. ? By correcting abnormal levels of electrolytes. This is often done by giving electrolytes through a tube that is passed through your nose and into your stomach (nasogastric tube, or NG tube). ? By treating the underlying cause of dehydration. Follow these instructions at home: Oral rehydration solution If told by your health care provider, drink an ORS:  Make   an ORS by following instructions on the package.  Start by drinking small amounts, about  cup (120 mL) every 5-10 minutes.  Slowly increase how much you drink until you have taken the amount recommended by your health care provider. Eating and drinking         Drink enough clear fluid to keep your urine pale yellow. If you were told to drink an ORS, finish the ORS first and then start slowly  drinking other clear fluids. Drink fluids such as: ? Water. Do not drink only water. Doing that can lead to hyponatremia, which is having too little salt (sodium) in the body. ? Water from ice chips you suck on. ? Fruit juice that you have added water to (diluted fruit juice). ? Low-calorie sports drinks.  Eat foods that contain a healthy balance of electrolytes, such as bananas, oranges, potatoes, tomatoes, and spinach.  Do not drink alcohol.  Avoid the following: ? Drinks that contain a lot of sugar. These include high-calorie sports drinks, fruit juice that is not diluted, and soda. ? Caffeine. ? Foods that are greasy or contain a lot of fat or sugar. General instructions  Take over-the-counter and prescription medicines only as told by your health care provider.  Do not take sodium tablets. Doing that can lead to having too much sodium in the body (hypernatremia).  Return to your normal activities as told by your health care provider. Ask your health care provider what activities are safe for you.  Keep all follow-up visits as told by your health care provider. This is important. Contact a health care provider if:  You have muscle cramps, pain, or discomfort, such as: ? Pain in your abdomen and the pain gets worse or stays in one area (localizes). ? Stiff neck.  You have a rash.  You are more irritable than usual.  You are sleepier or have a harder time waking than usual.  You feel weak or dizzy.  You feel very thirsty. Get help right away if you have:  Any symptoms of severe dehydration.  Symptoms of vomiting, such as: ? You cannot eat or drink without vomiting. ? Vomiting gets worse or does not go away. ? Vomit includes blood or green matter (bile).  Symptoms that get worse with treatment.  A fever.  A severe headache.  Problems with urination or bowel movements, such as: ? Diarrhea that gets worse or does not go away. ? Blood in your stool (feces). This  may cause stool to look black and tarry. ? Not urinating, or urinating only a small amount of very dark urine, within 6-8 hours.  Trouble breathing. These symptoms may represent a serious problem that is an emergency. Do not wait to see if the symptoms will go away. Get medical help right away. Call your local emergency services (911 in the [place] Do not drive yourself to the hospital. Summary  Dehydration is a condition in which there is not enough water or other fluids in the body. This happens when a person loses more fluids than he or she takes in.  Treatment for this condition depends on how severe it is. Treatment should be started right away. Do not wait until dehydration becomes severe.  Drink enough clear fluid to keep your urine pale yellow. If you were told to drink an oral rehydration solution (ORS), finish the ORS first and then start slowly drinking other clear fluids.  Take over-the-counter and prescription medicines only as told by your health care   provider.  Get help right away if you have any symptoms of severe dehydration. This information is not intended to replace advice given to you by your health care provider. Make sure you discuss any questions you have with your health care provider. Document Revised: 09/22/2018 Document Reviewed: 09/22/2018 Elsevier Patient Education  2020 [employer]   

## 2019-09-22 NOTE — Progress Notes (Signed)
Hematology and Oncology Follow Up Visit  [name] [id number] Dec 30, 1950 69 y.o. 09/22/2019   Principle Diagnosis:  IgG kappa myeloma  Anemia secondary to renal insufficiency  Intermittent iron - deficiency anemia  Hypotestosteronemia  Current Therapy: Aredia 60 mg IV q 3 months-- next dose09/2021 Aranesp 300 mcg subcu as needed for hemoglobin less than 11 DepoTestosterone400 mg q4weeks IV iron as indicated Kyprolis/Cytoxan - s/p cycle23   Interim History:  [name] is here today for follow-up and treatment.  Unfortunately, they were not had hold on treatment for today.  For some reason, his renal function is worse.  His creatinine is 3.41.  I suspect this is all from his diabetes.  He is feeling weak.  His hemoglobin is 8.9.  I suspect that he is going to need Aranesp.  His iron studies actually show his iron saturation would be 17%.  We will going to have to give him a dose of IV iron in a week.  As far as myeloma is concerned, when he was here a month ago, there was no monoclonal spike in his blood.  His IgG level was 987mg /dL.  His kappa light chain was 2.8 mg/dL.  He was incredibly kind and brought the whole staff food yesterday.  He was incredibly delicious.  He gets his testosterone injections.  Last time we checked his testosterone back in April his testosterone level was 429.  He does have the diabetes.  Does have fibromyalgia.  All this is being managed by his other physicians.  Currently, his performance status is ECOG 1.  Medications:  Allergies as of 09/22/2019      Reactions   Iodinated Diagnostic Agents Rash   Patient states he was instructed not to take IV contrast.  In 2008 he had an unknown reaction, and was told not to take it again.  He was also told not to take it due to his kidneys.   Iodine Anxiety, Rash, Other (See Comments)   Didn't feel right "instructed not to take per MD--something with his port"      Medication List        Accurate as of September 22, 2019  2:46 PM. If you have any questions, ask your nurse or doctor.        amLODipine 5 MG tablet Commonly known as: NORVASC Take 5 mg by mouth 2 (two) times daily.   atorvastatin 40 MG tablet Commonly known as: LIPITOR Take 40 mg by mouth daily at 6 PM.   Carafate 1 GM/10ML suspension Generic drug: sucralfate Take 1 g by mouth 4 (four) times daily.   dicyclomine 10 MG capsule Commonly known as: BENTYL Take 1 capsule (10 mg total) by mouth 3 (three) times daily before meals.   gabapentin 300 MG capsule Commonly known as: NEURONTIN TAKE 1 CAPSULE BY MOUTH THREE TIMES A DAY What changed:   how much to take  how to take this  when to take this  additional instructions   glimepiride 2 MG tablet Commonly known as: AMARYL Take 2 mg by mouth daily with breakfast.   lactulose 10 GM/15ML solution Commonly known as: CHRONULAC Take 30 mLs (20 g total) by mouth daily.   losartan 50 MG tablet Commonly known as: COZAAR Take 50 mg by mouth daily.   metFORMIN 500 MG tablet Commonly known as: GLUCOPHAGE Take 500 mg by mouth 2 (two) times daily.   metoprolol succinate 25 MG 24 hr tablet Commonly known as: TOPROL-XL Take 25 mg by mouth daily.  mirabegron ER 50 MG Tb24 tablet Commonly known as: MYRBETRIQ Take 50 mg by mouth daily.   morphine 30 MG 12 hr tablet Commonly known as: MS CONTIN Take 30 mg by mouth 2 (two) times daily.   omeprazole 40 MG capsule Commonly known as: PRILOSEC TAKE 1 CAPSULE BY MOUTH TWICE A DAY What changed: when to take this   ondansetron 8 MG tablet Commonly known as: ZOFRAN TAKE 1 TABLET(8 MG) BY MOUTH TWICE DAILY AS NEEDED FOR NAUSEA OR VOMITING   oxyCODONE-acetaminophen 10-325 MG tablet Commonly known as: PERCOCET Take 1 tablet by mouth every 6 (six) hours as needed for pain.   pioglitazone 30 MG tablet Commonly known as: ACTOS Take 30 mg by mouth daily.   prochlorperazine 10 MG tablet Commonly known  as: COMPAZINE take 1 tablet by mouth every 6 hours if needed for nausea and vomiting   Restasis 0.05 % ophthalmic emulsion Generic drug: cycloSPORINE Place 1 drop into both eyes daily.   SUMAtriptan 100 MG tablet Commonly known as: IMITREX Take 100 mg by mouth daily.   topiramate 25 MG tablet Commonly known as: TOPAMAX Take 25 mg by mouth daily.   torsemide 20 MG tablet Commonly known as: DEMADEX Take 40 mg by mouth daily.   Trulicity 3.53 GD/9.2EQ Sopn Generic drug: Dulaglutide Inject 0.75 mg into the skin every Saturday.   Vitamin D (Ergocalciferol) 1.25 MG (50000 UNIT) Caps capsule Commonly known as: DRISDOL Take 50,000 Units by mouth every Saturday.       Allergies:  Allergies  Allergen Reactions  . Iodinated Diagnostic Agents Rash    Patient states he was instructed not to take IV contrast.  In 2008 he had an unknown reaction, and was told not to take it again.  He was also told not to take it due to his kidneys.  . Iodine Anxiety, Rash and Other (See Comments)    Didn't feel right "instructed not to take per MD--something with his port"     Past Medical History, Surgical history, Social history, and Family History were reviewed and updated.  Review of Systems: Review of Systems  Constitutional: Positive for malaise/fatigue.  HENT: Negative.   Eyes: Negative.   Respiratory: Negative.   Cardiovascular: Negative.   Gastrointestinal: Negative.   Genitourinary: Negative.   Musculoskeletal: Positive for joint pain, myalgias and neck pain.  Skin: Negative.   Neurological: Positive for headaches.  Endo/Heme/Allergies: Negative.   Psychiatric/Behavioral: Negative.      Physical Exam:  weight is 191 lb (86.6 kg). His oral temperature is 98.8 F (37.1 C). His blood pressure is 124/60 (abnormal) and his pulse is 84. His respiration is 18 and oxygen saturation is 100%.   Wt Readings from Last 3 Encounters:  09/22/19 191 lb (86.6 kg)  08/24/19 185 lb (83.9  kg)  08/12/19 192 lb 12.8 oz (87.5 kg)    Physical Exam Vitals reviewed.  HENT:     Head: Normocephalic and atraumatic.  Eyes:     Pupils: Pupils are equal, round, and reactive to light.  Cardiovascular:     Rate and Rhythm: Normal rate and regular rhythm.     Heart sounds: Normal heart sounds.  Pulmonary:     Effort: Pulmonary effort is normal.     Breath sounds: Normal breath sounds.  Abdominal:     General: Bowel sounds are normal.     Palpations: Abdomen is soft.  Musculoskeletal:        General: No tenderness or deformity. Normal range of  motion.     Cervical back: Normal range of motion.  Lymphadenopathy:     Cervical: No cervical adenopathy.  Skin:    General: Skin is warm and dry.     Findings: No erythema or rash.  Neurological:     Mental Status: He is alert and oriented to person, place, and time.  Psychiatric:        Behavior: Behavior normal.        Thought Content: Thought content normal.        Judgment: Judgment normal.      Lab Results  Component Value Date   WBC 4.4 09/22/2019   HGB 8.9 (L) 09/22/2019   HCT 27.1 (L) 09/22/2019   MCV 81.6 09/22/2019   PLT 160 09/22/2019   Lab Results  Component Value Date   FERRITIN 1,215 (H) 09/22/2019   IRON 43 09/22/2019   TIBC 249 09/22/2019   UIBC 205 09/22/2019   IRONPCTSAT 17 (L) 09/22/2019   Lab Results  Component Value Date   RETICCTPCT 1.1 07/27/2019   RBC 3.32 (L) 09/22/2019   RETICCTABS 27.5 03/21/2014   Lab Results  Component Value Date   KPAFRELGTCHN 27.6 (H) 08/24/2019   LAMBDASER 16.7 08/24/2019   KAPLAMBRATIO 1.65 08/24/2019   Lab Results  Component Value Date   IGGSERUM 987 08/24/2019   IGA 175 08/24/2019   IGMSERUM 36 08/24/2019   Lab Results  Component Value Date   TOTALPROTELP 7.1 08/24/2019   ALBUMINELP 4.1 08/24/2019   A1GS 0.2 08/24/2019   A2GS 1.0 08/24/2019   BETS 0.9 08/24/2019   BETA2SER 0.5 11/07/2014   GAMS 0.9 08/24/2019   MSPIKE Not Observed 08/24/2019    SPEI Comment 08/24/2019     Chemistry      Component Value Date/Time   NA 139 09/22/2019 0825   NA 143 02/22/2017 0744   K 3.3 (L) 09/22/2019 0825   K 4.0 02/22/2017 0744   CL 101 09/22/2019 0825   CL 106 02/22/2017 0744   CO2 28 09/22/2019 0825   CO2 27 02/22/2017 0744   BUN 31 (H) 09/22/2019 0825   BUN 26 (H) 02/22/2017 0744   CREATININE 3.74 (HH) 09/22/2019 0825   CREATININE 2.3 (H) 02/22/2017 0744      Component Value Date/Time   CALCIUM 9.3 09/22/2019 0825   CALCIUM 9.3 02/22/2017 0744   ALKPHOS 54 09/22/2019 0825   ALKPHOS 80 02/22/2017 0744   AST 24 09/22/2019 0825   ALT 12 09/22/2019 0825   ALT 22 02/22/2017 0744   BILITOT 0.5 09/22/2019 0825       Impression and Plan: [name] is a very pleasant 69 yo African American gentleman with IgG kappa myeloma.  As far as the myeloma is concerned, he is doing well with this.  I forgot to mention that I does have problems with headaches.  He has seen a neurologist for this.  I think he goes back to see a neurologist soon.  Again we are going to hold on his treatment for right now.  He really needs to get the kidney issues sorted out.  Again I have to believe this is all from his diabetes.  His myeloma has been under very good control.  Thankfully, we have the luxury of being able to hold his myeloma treatment since his myeloma really has been in remission.  We will plan to get her back in 3 weeks.    Volanda Napoleon, MD 7/30/20212:46 PM

## 2019-09-22 NOTE — Telephone Encounter (Signed)
Dr. [name] notified of creat-3.74.  Order received to hold treatment today and for pt to receive 500 ml of NS and Aranesp only today per Dr. [name].  CMET results routed to Dr. Jonelle Sidle and Dr. Neta Ehlers.

## 2019-09-23 LAB — IGG, IGA, IGM
IgA: 153 mg/dL (ref 61–437)
IgG (Immunoglobin G), Serum: 834 mg/dL (ref 603–1613)
IgM (Immunoglobulin M), Srm: 29 mg/dL (ref 20–172)

## 2019-09-24 LAB — KAPPA/LAMBDA LIGHT CHAINS
Kappa free light chain: 43.7 mg/L — ABNORMAL HIGH (ref 3.3–19.4)
Kappa, lambda light chain ratio: 1.96 — ABNORMAL HIGH (ref 0.26–1.65)
Lambda free light chains: 22.3 mg/L (ref 5.7–26.3)

## 2019-09-25 LAB — PROTEIN ELECTROPHORESIS, SERUM, WITH REFLEX
A/G Ratio: 1.5 (ref 0.7–1.7)
Albumin ELP: 4 g/dL (ref 2.9–4.4)
Alpha-1-Globulin: 0.2 g/dL (ref 0.0–0.4)
Alpha-2-Globulin: 0.9 g/dL (ref 0.4–1.0)
Beta Globulin: 0.8 g/dL (ref 0.7–1.3)
Gamma Globulin: 0.8 g/dL (ref 0.4–1.8)
Globulin, Total: 2.7 g/dL (ref 2.2–3.9)
Total Protein ELP: 6.7 g/dL (ref 6.0–8.5)

## 2019-09-29 ENCOUNTER — Other Ambulatory Visit: Payer: Self-pay

## 2019-09-29 ENCOUNTER — Inpatient Hospital Stay: Payer: 59 | Attending: Hematology & Oncology

## 2019-09-29 VITALS — BP 123/45 | HR 83 | Temp 98.3°F | Resp 18

## 2019-09-29 DIAGNOSIS — D631 Anemia in chronic kidney disease: Secondary | ICD-10-CM | POA: Insufficient documentation

## 2019-09-29 DIAGNOSIS — C9000 Multiple myeloma not having achieved remission: Secondary | ICD-10-CM | POA: Insufficient documentation

## 2019-09-29 DIAGNOSIS — Z95828 Presence of other vascular implants and grafts: Secondary | ICD-10-CM

## 2019-09-29 DIAGNOSIS — Z5111 Encounter for antineoplastic chemotherapy: Secondary | ICD-10-CM | POA: Diagnosis present

## 2019-09-29 DIAGNOSIS — E349 Endocrine disorder, unspecified: Secondary | ICD-10-CM

## 2019-09-29 DIAGNOSIS — Z5112 Encounter for antineoplastic immunotherapy: Secondary | ICD-10-CM | POA: Insufficient documentation

## 2019-09-29 DIAGNOSIS — Z299 Encounter for prophylactic measures, unspecified: Secondary | ICD-10-CM

## 2019-09-29 DIAGNOSIS — N189 Chronic kidney disease, unspecified: Secondary | ICD-10-CM | POA: Diagnosis not present

## 2019-09-29 MED ORDER — TESTOSTERONE CYPIONATE 200 MG/ML IM SOLN
INTRAMUSCULAR | Status: AC
  Filled 2019-09-29: qty 2

## 2019-09-29 MED ORDER — SODIUM CHLORIDE 0.9 % IV SOLN
INTRAVENOUS | Status: DC
  Filled 2019-09-29: qty 250

## 2019-09-29 MED ORDER — HEPARIN SOD (PORK) LOCK FLUSH 100 UNIT/ML IV SOLN
500.0000 [IU] | Freq: Once | INTRAVENOUS | Status: AC
  Administered 2019-09-29: 500 [IU] via INTRAVENOUS
  Filled 2019-09-29: qty 5

## 2019-09-29 MED ORDER — SODIUM CHLORIDE 0.9% FLUSH
10.0000 mL | INTRAVENOUS | Status: DC | PRN
  Administered 2019-09-29: 10 mL via INTRAVENOUS
  Filled 2019-09-29: qty 10

## 2019-09-29 MED ORDER — TESTOSTERONE CYPIONATE 200 MG/ML IM SOLN
400.0000 mg | Freq: Once | INTRAMUSCULAR | Status: AC
  Administered 2019-09-29: 400 mg via INTRAMUSCULAR

## 2019-09-29 MED ORDER — SODIUM CHLORIDE 0.9 % IV SOLN
200.0000 mg | Freq: Once | INTRAVENOUS | Status: AC
  Administered 2019-09-29: 200 mg via INTRAVENOUS
  Filled 2019-09-29: qty 200

## 2019-09-29 MED ORDER — SODIUM CHLORIDE 0.9 % IV SOLN
750.0000 mg | Freq: Once | INTRAVENOUS | Status: DC
  Filled 2019-09-29: qty 15

## 2019-09-29 NOTE — Patient Instructions (Signed)

## 2019-10-04 ENCOUNTER — Other Ambulatory Visit: Payer: Self-pay | Admitting: Hematology & Oncology

## 2019-10-04 DIAGNOSIS — K219 Gastro-esophageal reflux disease without esophagitis: Secondary | ICD-10-CM

## 2019-10-06 ENCOUNTER — Other Ambulatory Visit: Payer: Self-pay

## 2019-10-06 ENCOUNTER — Inpatient Hospital Stay: Payer: 59

## 2019-10-06 VITALS — BP 138/64 | HR 83 | Temp 98.4°F | Resp 18

## 2019-10-06 DIAGNOSIS — M4712 Other spondylosis with myelopathy, cervical region: Secondary | ICD-10-CM

## 2019-10-06 DIAGNOSIS — E349 Endocrine disorder, unspecified: Secondary | ICD-10-CM

## 2019-10-06 DIAGNOSIS — D631 Anemia in chronic kidney disease: Secondary | ICD-10-CM

## 2019-10-06 DIAGNOSIS — Z299 Encounter for prophylactic measures, unspecified: Secondary | ICD-10-CM

## 2019-10-06 DIAGNOSIS — C9000 Multiple myeloma not having achieved remission: Secondary | ICD-10-CM

## 2019-10-06 DIAGNOSIS — Z5112 Encounter for antineoplastic immunotherapy: Secondary | ICD-10-CM | POA: Diagnosis not present

## 2019-10-06 MED ORDER — SODIUM CHLORIDE 0.9 % IV SOLN
200.0000 mg | Freq: Once | INTRAVENOUS | Status: AC
  Administered 2019-10-06: 200 mg via INTRAVENOUS
  Filled 2019-10-06: qty 200

## 2019-10-06 MED ORDER — HYDROMORPHONE HCL 1 MG/ML IJ SOLN
INTRAMUSCULAR | Status: AC
  Filled 2019-10-06: qty 2

## 2019-10-06 MED ORDER — HYDROMORPHONE HCL 1 MG/ML IJ SOLN
2.0000 mg | Freq: Once | INTRAMUSCULAR | Status: AC
  Administered 2019-10-06: 2 mg via INTRAVENOUS

## 2019-10-06 MED ORDER — SODIUM CHLORIDE 0.9 % IV SOLN
Freq: Once | INTRAVENOUS | Status: AC
  Filled 2019-10-06: qty 250

## 2019-10-06 MED ORDER — SODIUM CHLORIDE 0.9% FLUSH
10.0000 mL | INTRAVENOUS | Status: DC | PRN
  Administered 2019-10-06: 10 mL via INTRAVENOUS
  Filled 2019-10-06: qty 10

## 2019-10-06 MED ORDER — SODIUM CHLORIDE 0.9 % IV SOLN
60.0000 mg | Freq: Once | INTRAVENOUS | Status: AC
  Administered 2019-10-06: 60 mg via INTRAVENOUS
  Filled 2019-10-06: qty 20
  Filled 2019-10-06: qty 10

## 2019-10-06 MED ORDER — HEPARIN SOD (PORK) LOCK FLUSH 100 UNIT/ML IV SOLN
500.0000 [IU] | Freq: Once | INTRAVENOUS | Status: AC
  Administered 2019-10-06: 500 [IU] via INTRAVENOUS
  Filled 2019-10-06: qty 5

## 2019-10-06 NOTE — Patient Instructions (Signed)
Pamidronate injection What is this medicine? PAMIDRONATE (pa mi DROE nate) slows calcium loss from bones. It is used to treat high calcium blood levels from cancer or Paget's disease. It is also used to treat bone pain and prevent fractures from certain cancers that have spread to the bone. This medicine may be used for other purposes; ask your health care provider or pharmacist if you have questions. COMMON BRAND NAME(S): Aredia What should I tell my health care provider before I take this medicine? They need to know if you have any of these conditions:  aspirin-sensitive asthma  dental disease  kidney disease  an unusual or allergic reaction to pamidronate, other medicines, foods, dyes, or preservatives  pregnant or trying to get pregnant  breast-feeding How should I use this medicine? This medicine is for infusion into a vein. It is given by a health care professional in a hospital or clinic setting. Talk to your pediatrician regarding the use of this medicine in children. This medicine is not approved for use in children. Overdosage: If you think you have taken too much of this medicine contact a poison control center or emergency room at once. NOTE: This medicine is only for you. Do not share this medicine with others. What if I miss a dose? This does not apply. What may interact with this medicine?  certain antibiotics given by injection  medicines for inflammation or pain like ibuprofen, naproxen  some diuretics like bumetanide, furosemide  cyclosporine  parathyroid hormone  tacrolimus  teriparatide  thalidomide This list may not describe all possible interactions. Give your health care provider a list of all the medicines, herbs, non-prescription drugs, or dietary supplements you use. Also tell them if you smoke, drink alcohol, or use illegal drugs. Some items may interact with your medicine. What should I watch for while using this medicine? Visit your doctor or  health care professional for regular checkups. It may be some time before you see the benefit from this medicine. Do not stop taking your medicine unless your doctor tells you to. Your doctor may order blood tests or other tests to see how you are doing. Women should inform their doctor if they wish to become pregnant or think they might be pregnant. There is a potential for serious side effects to an unborn child. Talk to your health care professional or pharmacist for more information. You should make sure that you get enough calcium and vitamin D while you are taking this medicine. Discuss the foods you eat and the vitamins you take with your health care professional. Some people who take this medicine have severe bone, joint, and/or muscle pain. This medicine may also increase your risk for a broken thigh bone. Tell your doctor right away if you have pain in your upper leg or groin. Tell your doctor if you have any pain that does not go away or that gets worse. What side effects may I notice from receiving this medicine? Side effects that you should report to your doctor or health care professional as soon as possible:  allergic reactions like skin rash, itching or hives, swelling of the face, lips, or tongue  black or tarry stools  changes in vision  eye inflammation, pain  high blood pressure  jaw pain, especially burning or cramping  muscle weakness  numb, tingling pain  swelling of feet or hands  trouble passing urine or change in the amount of urine  unable to move easily Side effects that usually do not  require medical attention (report to your doctor or health care professional if they continue or are bothersome):  bone, joint, or muscle pain  constipation  dizzy, drowsy  fever  headache  loss of appetite  nausea, vomiting  pain at site where injected This list may not describe all possible side effects. Call your doctor for medical advice about side effects.  You may report side effects to FDA at 1-800-FDA-1088. Where should I keep my medicine? This drug is given in a hospital or clinic and will not be stored at home. NOTE: This sheet is a summary. It may not cover all possible information. If you have questions about this medicine, talk to your doctor, pharmacist, or health care provider.  2020 Elsevier/Gold Standard (2010-08-08 08:49:49)

## 2019-10-13 ENCOUNTER — Inpatient Hospital Stay: Payer: 59

## 2019-10-13 ENCOUNTER — Inpatient Hospital Stay (HOSPITAL_BASED_OUTPATIENT_CLINIC_OR_DEPARTMENT_OTHER): Payer: 59 | Admitting: Family

## 2019-10-13 ENCOUNTER — Other Ambulatory Visit: Payer: Self-pay

## 2019-10-13 ENCOUNTER — Encounter: Payer: Self-pay | Admitting: Family

## 2019-10-13 VITALS — BP 145/73 | HR 83 | Temp 97.6°F | Resp 18 | Ht 71.0 in | Wt 189.9 lb

## 2019-10-13 DIAGNOSIS — C9000 Multiple myeloma not having achieved remission: Secondary | ICD-10-CM

## 2019-10-13 DIAGNOSIS — N189 Chronic kidney disease, unspecified: Secondary | ICD-10-CM

## 2019-10-13 DIAGNOSIS — Z5112 Encounter for antineoplastic immunotherapy: Secondary | ICD-10-CM | POA: Diagnosis not present

## 2019-10-13 DIAGNOSIS — M7989 Other specified soft tissue disorders: Secondary | ICD-10-CM

## 2019-10-13 DIAGNOSIS — D500 Iron deficiency anemia secondary to blood loss (chronic): Secondary | ICD-10-CM | POA: Diagnosis not present

## 2019-10-13 DIAGNOSIS — E349 Endocrine disorder, unspecified: Secondary | ICD-10-CM | POA: Diagnosis not present

## 2019-10-13 DIAGNOSIS — D631 Anemia in chronic kidney disease: Secondary | ICD-10-CM

## 2019-10-13 DIAGNOSIS — Z299 Encounter for prophylactic measures, unspecified: Secondary | ICD-10-CM

## 2019-10-13 LAB — CBC WITH DIFFERENTIAL (CANCER CENTER ONLY)
Abs Immature Granulocytes: 0.02 10*3/uL (ref 0.00–0.07)
Basophils Absolute: 0.1 10*3/uL (ref 0.0–0.1)
Basophils Relative: 1 %
Eosinophils Absolute: 0.1 10*3/uL (ref 0.0–0.5)
Eosinophils Relative: 3 %
HCT: 29.8 % — ABNORMAL LOW (ref 39.0–52.0)
Hemoglobin: 9.7 g/dL — ABNORMAL LOW (ref 13.0–17.0)
Immature Granulocytes: 1 %
Lymphocytes Relative: 22 %
Lymphs Abs: 0.9 10*3/uL (ref 0.7–4.0)
MCH: 26.5 pg (ref 26.0–34.0)
MCHC: 32.6 g/dL (ref 30.0–36.0)
MCV: 81.4 fL (ref 80.0–100.0)
Monocytes Absolute: 0.6 10*3/uL (ref 0.1–1.0)
Monocytes Relative: 16 %
Neutro Abs: 2.3 10*3/uL (ref 1.7–7.7)
Neutrophils Relative %: 57 %
Platelet Count: 185 10*3/uL (ref 150–400)
RBC: 3.66 MIL/uL — ABNORMAL LOW (ref 4.22–5.81)
RDW: 16.9 % — ABNORMAL HIGH (ref 11.5–15.5)
WBC Count: 4 10*3/uL (ref 4.0–10.5)
nRBC: 0 % (ref 0.0–0.2)

## 2019-10-13 LAB — FERRITIN: Ferritin: 1600 ng/mL — ABNORMAL HIGH (ref 24–336)

## 2019-10-13 LAB — CMP (CANCER CENTER ONLY)
ALT: 11 U/L (ref 0–44)
AST: 18 U/L (ref 15–41)
Albumin: 4.4 g/dL (ref 3.5–5.0)
Alkaline Phosphatase: 57 U/L (ref 38–126)
Anion gap: 10 (ref 5–15)
BUN: 18 mg/dL (ref 8–23)
CO2: 31 mmol/L (ref 22–32)
Calcium: 9.4 mg/dL (ref 8.9–10.3)
Chloride: 102 mmol/L (ref 98–111)
Creatinine: 2.35 mg/dL — ABNORMAL HIGH (ref 0.61–1.24)
GFR, Est AFR Am: 31 mL/min — ABNORMAL LOW (ref >60)
GFR, Estimated: 27 mL/min — ABNORMAL LOW (ref >60)
Glucose, Bld: 111 mg/dL — ABNORMAL HIGH (ref 70–99)
Potassium: 3.2 mmol/L — ABNORMAL LOW (ref 3.5–5.1)
Sodium: 143 mmol/L (ref 135–145)
Total Bilirubin: 0.4 mg/dL (ref 0.3–1.2)
Total Protein: 7.2 g/dL (ref 6.5–8.1)

## 2019-10-13 LAB — IRON AND TIBC
Iron: 72 ug/dL (ref 42–163)
Saturation Ratios: 28 % (ref 20–55)
TIBC: 255 ug/dL (ref 202–409)
UIBC: 183 ug/dL (ref 117–376)

## 2019-10-13 LAB — LACTATE DEHYDROGENASE: LDH: 390 U/L — ABNORMAL HIGH (ref 98–192)

## 2019-10-13 MED ORDER — SODIUM CHLORIDE 0.9 % IV SOLN
INTRAVENOUS | Status: DC
  Filled 2019-10-13: qty 250

## 2019-10-13 MED ORDER — SODIUM CHLORIDE 0.9 % IV SOLN
10.0000 mg | Freq: Once | INTRAVENOUS | Status: AC
  Administered 2019-10-13: 10 mg via INTRAVENOUS
  Filled 2019-10-13: qty 10

## 2019-10-13 MED ORDER — DEXTROSE 5 % IV SOLN
29.0000 mg/m2 | Freq: Once | INTRAVENOUS | Status: AC
  Administered 2019-10-13: 60 mg via INTRAVENOUS
  Filled 2019-10-13: qty 30

## 2019-10-13 MED ORDER — PALONOSETRON HCL INJECTION 0.25 MG/5ML
0.2500 mg | Freq: Once | INTRAVENOUS | Status: AC
  Administered 2019-10-13: 0.25 mg via INTRAVENOUS

## 2019-10-13 MED ORDER — SODIUM CHLORIDE 0.9 % IV SOLN
Freq: Once | INTRAVENOUS | Status: DC
  Filled 2019-10-13: qty 250

## 2019-10-13 MED ORDER — HEPARIN SOD (PORK) LOCK FLUSH 100 UNIT/ML IV SOLN
500.0000 [IU] | Freq: Once | INTRAVENOUS | Status: AC | PRN
  Administered 2019-10-13: 500 [IU]
  Filled 2019-10-13: qty 5

## 2019-10-13 MED ORDER — HYDROMORPHONE HCL 1 MG/ML IJ SOLN
2.0000 mg | Freq: Once | INTRAMUSCULAR | Status: AC
  Administered 2019-10-13: 2 mg via INTRAVENOUS

## 2019-10-13 MED ORDER — SODIUM CHLORIDE 0.9 % IV SOLN
200.0000 mg | Freq: Once | INTRAVENOUS | Status: AC
  Administered 2019-10-13: 200 mg via INTRAVENOUS
  Filled 2019-10-13: qty 200

## 2019-10-13 MED ORDER — SODIUM CHLORIDE 0.9 % IV SOLN
300.0000 mg/m2 | Freq: Once | INTRAVENOUS | Status: AC
  Administered 2019-10-13: 620 mg via INTRAVENOUS
  Filled 2019-10-13: qty 31

## 2019-10-13 MED ORDER — PALONOSETRON HCL INJECTION 0.25 MG/5ML
INTRAVENOUS | Status: AC
  Filled 2019-10-13: qty 5

## 2019-10-13 MED ORDER — SODIUM CHLORIDE 0.9% FLUSH
10.0000 mL | INTRAVENOUS | Status: DC | PRN
  Administered 2019-10-13: 10 mL
  Filled 2019-10-13: qty 10

## 2019-10-13 MED ORDER — EPOETIN ALFA-EPBX 40000 UNIT/ML IJ SOLN: 40000.0000 [IU] | Freq: Once | INTRAMUSCULAR | Status: DC

## 2019-10-13 MED ORDER — SODIUM CHLORIDE 0.9 % IV SOLN
Freq: Once | INTRAVENOUS | Status: AC
  Filled 2019-10-13: qty 250

## 2019-10-13 MED ORDER — HYDROMORPHONE HCL 1 MG/ML IJ SOLN
INTRAMUSCULAR | Status: AC
  Filled 2019-10-13: qty 2

## 2019-10-13 NOTE — Progress Notes (Signed)
SCr = 2.35 today, okay to treat per Dr. [name].

## 2019-10-13 NOTE — Patient Instructions (Signed)

## 2019-10-13 NOTE — Progress Notes (Signed)
Hematology and Oncology Follow Up Visit  [name] [id number] Dec 11, 1950 69 y.o. 10/13/2019   Principle Diagnosis:  IgG kappa myeloma  Anemia secondary to renal insufficiency  Intermittent iron - deficiency anemia  Hypotestosteronemia  Current Therapy: Aredia 60 mg IV q 3 months-- next dose09/2021 Aranesp 300 mcg subcu as needed for hemoglobin less than 11 DepoTestosterone400 mg q4weeks IV iron as indicated Kyprolis/Cytoxan - s/p cycle23   Interim History:  [name] is here today for follow-up and treatment. He is feeling fatigued at times and still having a lot of back and hip pain. He states that he sees his orthopedist again next week for injections.   He is able to ambulate without assistance and denies any falls or syncopal episodes.  July, No M-spike observed, IgG level was 834 mg/dL and kappa light chains 4.37 mg/dL.  No fever, chills, n/v, cough, rash, dizziness, SOB, chest pain, palpitations, abdominal pain or changes in bowel or bladder habits.  He has swelling in his feet and ankles but has more significant swelling in the left lower extremity with tenderness. We will get an [place] today to r/o DVT.  He states that his appetite comes and goes. He is working hard to stay well hydrated. His weight is stable at 189 lbs.   ECOG Performance Status: 1 - Symptomatic but completely ambulatory  Medications:  Allergies as of 10/13/2019      Reactions   Iodinated Diagnostic Agents Rash   Patient states he was instructed not to take IV contrast.  In 2008 he had an unknown reaction, and was told not to take it again.  He was also told not to take it due to his kidneys.   Iodine Anxiety, Rash, Other (See Comments)   Didn't feel right "instructed not to take per MD--something with his port"      Medication List       Accurate as of October 13, 2019  8:47 AM. If you have any questions, ask your nurse or doctor.        amLODipine 5 MG tablet Commonly known as:  NORVASC Take 5 mg by mouth 2 (two) times daily.   atorvastatin 40 MG tablet Commonly known as: LIPITOR Take 40 mg by mouth daily at 6 PM.   Carafate 1 GM/10ML suspension Generic drug: sucralfate Take 1 g by mouth 4 (four) times daily.   dicyclomine 10 MG capsule Commonly known as: BENTYL Take 1 capsule (10 mg total) by mouth 3 (three) times daily before meals.   gabapentin 300 MG capsule Commonly known as: NEURONTIN TAKE 1 CAPSULE BY MOUTH THREE TIMES A DAY What changed:   how much to take  how to take this  when to take this  additional instructions   glimepiride 2 MG tablet Commonly known as: AMARYL Take 2 mg by mouth daily with breakfast.   lactulose 10 GM/15ML solution Commonly known as: CHRONULAC Take 30 mLs (20 g total) by mouth daily.   losartan 50 MG tablet Commonly known as: COZAAR Take 50 mg by mouth daily.   metFORMIN 500 MG tablet Commonly known as: GLUCOPHAGE Take 500 mg by mouth 2 (two) times daily.   metoprolol succinate 25 MG 24 hr tablet Commonly known as: TOPROL-XL Take 25 mg by mouth daily.   mirabegron ER 50 MG Tb24 tablet Commonly known as: MYRBETRIQ Take 50 mg by mouth daily.   morphine 30 MG 12 hr tablet Commonly known as: MS CONTIN Take 30 mg by mouth 2 (two) times  daily.   omeprazole 40 MG capsule Commonly known as: PRILOSEC TAKE 1 CAPSULE BY MOUTH TWICE A DAY   ondansetron 8 MG tablet Commonly known as: ZOFRAN TAKE 1 TABLET(8 MG) BY MOUTH TWICE DAILY AS NEEDED FOR NAUSEA OR VOMITING   oxyCODONE-acetaminophen 10-325 MG tablet Commonly known as: PERCOCET Take 1 tablet by mouth every 6 (six) hours as needed for pain.   pioglitazone 30 MG tablet Commonly known as: ACTOS Take 30 mg by mouth daily.   prochlorperazine 10 MG tablet Commonly known as: COMPAZINE take 1 tablet by mouth every 6 hours if needed for nausea and vomiting   Restasis 0.05 % ophthalmic emulsion Generic drug: cycloSPORINE Place 1 drop into both eyes  daily.   SUMAtriptan 100 MG tablet Commonly known as: IMITREX Take 100 mg by mouth daily.   topiramate 25 MG tablet Commonly known as: TOPAMAX Take 25 mg by mouth daily.   torsemide 20 MG tablet Commonly known as: DEMADEX Take 40 mg by mouth daily.   Trulicity 2.83 TD/1.7OH Sopn Generic drug: Dulaglutide Inject 0.75 mg into the skin every Saturday.   Vitamin D (Ergocalciferol) 1.25 MG (50000 UNIT) Caps capsule Commonly known as: DRISDOL Take 50,000 Units by mouth every Saturday.       Allergies:  Allergies  Allergen Reactions  . Iodinated Diagnostic Agents Rash    Patient states he was instructed not to take IV contrast.  In 2008 he had an unknown reaction, and was told not to take it again.  He was also told not to take it due to his kidneys.  . Iodine Anxiety, Rash and Other (See Comments)    Didn't feel right "instructed not to take per MD--something with his port"     Past Medical History, Surgical history, Social history, and Family History were reviewed and updated.  Review of Systems: All other 10 point review of systems is negative.   Physical Exam:  height is 5\' 11"  (1.803 m) and weight is 189 lb 14.4 oz (86.1 kg). His oral temperature is 97.6 F (36.4 C). His blood pressure is 145/73 (abnormal) and his pulse is 83. His respiration is 18 and oxygen saturation is 100%.   Wt Readings from Last 3 Encounters:  10/13/19 189 lb 14.4 oz (86.1 kg)  09/22/19 191 lb (86.6 kg)  08/24/19 185 lb (83.9 kg)    Ocular: Sclerae unicteric, pupils equal, round and reactive to light Ear-nose-throat: Oropharynx clear, dentition fair Lymphatic: No cervical or supraclavicular adenopathy Lungs no rales or rhonchi, good excursion bilaterally Heart regular rate and rhythm, no murmur appreciated Abd soft, nontender, positive bowel sounds, no liver or spleen tip palpated on exam, no fluid wave  MSK no focal spinal tenderness, no joint edema Neuro: non-focal, well-oriented,  appropriate affect Breasts: Deferred   Lab Results  Component Value Date   WBC 4.0 10/13/2019   HGB 9.7 (L) 10/13/2019   HCT 29.8 (L) 10/13/2019   MCV 81.4 10/13/2019   PLT 185 10/13/2019   Lab Results  Component Value Date   FERRITIN 1,215 (H) 09/22/2019   IRON 43 09/22/2019   TIBC 249 09/22/2019   UIBC 205 09/22/2019   IRONPCTSAT 17 (L) 09/22/2019   Lab Results  Component Value Date   RETICCTPCT 1.1 07/27/2019   RBC 3.66 (L) 10/13/2019   RETICCTABS 27.5 03/21/2014   Lab Results  Component Value Date   KPAFRELGTCHN 43.7 (H) 09/22/2019   LAMBDASER 22.3 09/22/2019   KAPLAMBRATIO 1.96 (H) 09/22/2019   Lab Results  Component Value Date   IGGSERUM 834 09/22/2019   IGA 153 09/22/2019   IGMSERUM 29 09/22/2019   Lab Results  Component Value Date   TOTALPROTELP 6.7 09/22/2019   ALBUMINELP 4.0 09/22/2019   A1GS 0.2 09/22/2019   A2GS 0.9 09/22/2019   BETS 0.8 09/22/2019   BETA2SER 0.5 11/07/2014   GAMS 0.8 09/22/2019   MSPIKE Not Observed 09/22/2019   SPEI Comment 08/24/2019     Chemistry      Component Value Date/Time   NA 139 09/22/2019 0825   NA 143 02/22/2017 0744   K 3.3 (L) 09/22/2019 0825   K 4.0 02/22/2017 0744   CL 101 09/22/2019 0825   CL 106 02/22/2017 0744   CO2 28 09/22/2019 0825   CO2 27 02/22/2017 0744   BUN 31 (H) 09/22/2019 0825   BUN 26 (H) 02/22/2017 0744   CREATININE 3.74 (HH) 09/22/2019 0825   CREATININE 2.3 (H) 02/22/2017 0744      Component Value Date/Time   CALCIUM 9.3 09/22/2019 0825   CALCIUM 9.3 02/22/2017 0744   ALKPHOS 54 09/22/2019 0825   ALKPHOS 80 02/22/2017 0744   AST 24 09/22/2019 0825   ALT 12 09/22/2019 0825   ALT 22 02/22/2017 0744   BILITOT 0.5 09/22/2019 0825       Impression and Plan: [name] is a very pleasant 69 yo African American gentleman with IgG kappa myeloma. We will proceed with treatment today as planned.  He received Aranesp for Hgb 9.7.  We will get an [place] of his left leg to rule out DVT.    We will plan to see him again in another 4 weeks.  He can contact our office with any questions or concerns.   Laverna Peace, NP 8/20/20218:47 AM

## 2019-10-13 NOTE — Patient Instructions (Signed)
Cyclophosphamide Injection What is this medicine? CYCLOPHOSPHAMIDE (sye kloe FOSS fa mide) is a chemotherapy drug. It slows the growth of cancer cells. This medicine is used to treat many types of cancer like lymphoma, myeloma, leukemia, breast cancer, and ovarian cancer, to name a few. This medicine may be used for other purposes; ask your health care provider or pharmacist if you have questions. COMMON BRAND NAME(S): Cytoxan, Neosar What should I tell my health care provider before I take this medicine? They need to know if you have any of these conditions:  heart disease  history of irregular heartbeat  infection  kidney disease  liver disease  low blood counts, like white cells, platelets, or red blood cells  on hemodialysis  recent or ongoing radiation therapy  scarring or thickening of the lungs  trouble passing urine  an unusual or allergic reaction to cyclophosphamide, other medicines, foods, dyes, or preservatives  pregnant or trying to get pregnant  breast-feeding How should I use this medicine? This drug is usually given as an injection into a vein or muscle or by infusion into a vein. It is administered in a hospital or clinic by a specially trained health care professional. Talk to your pediatrician regarding the use of this medicine in children. Special care may be needed. Overdosage: If you think you have taken too much of this medicine contact a poison control center or emergency room at once. NOTE: This medicine is only for you. Do not share this medicine with others. What if I miss a dose? It is important not to miss your dose. Call your doctor or health care professional if you are unable to keep an appointment. What may interact with this medicine?  amphotericin B  azathioprine  certain antivirals for HIV or hepatitis  certain medicines for blood pressure, heart disease, irregular heart beat  certain medicines that treat or prevent blood clots  like warfarin  certain other medicines for cancer  cyclosporine  etanercept  indomethacin  medicines that relax muscles for surgery  medicines to increase blood counts  metronidazole This list may not describe all possible interactions. Give your health care provider a list of all the medicines, herbs, non-prescription drugs, or dietary supplements you use. Also tell them if you smoke, drink alcohol, or use illegal drugs. Some items may interact with your medicine. What should I watch for while using this medicine? Your condition will be monitored carefully while you are receiving this medicine. You may need blood work done while you are taking this medicine. Drink water or other fluids as directed. Urinate often, even at night. Some products may contain alcohol. Ask your health care professional if this medicine contains alcohol. Be sure to tell all health care professionals you are taking this medicine. Certain medicines, like metronidazole and disulfiram, can cause an unpleasant reaction when taken with alcohol. The reaction includes flushing, headache, nausea, vomiting, sweating, and increased thirst. The reaction can last from 30 minutes to several hours. Do not become pregnant while taking this medicine or for 1 year after stopping it. Women should inform their health care professional if they wish to become pregnant or think they might be pregnant. Men should not father a child while taking this medicine and for 4 months after stopping it. There is potential for serious side effects to an unborn child. Talk to your health care professional for more information. Do not breast-feed an infant while taking this medicine or for 1 week after stopping it. This medicine has  caused ovarian failure in some women. This medicine may make it more difficult to get pregnant. Talk to your health care professional if you are concerned about your fertility. This medicine has caused decreased sperm  counts in some men. This may make it more difficult to father a child. Talk to your health care professional if you are concerned about your fertility. Call your health care professional for advice if you get a fever, chills, or sore throat, or other symptoms of a cold or flu. Do not treat yourself. This medicine decreases your body's ability to fight infections. Try to avoid being around people who are sick. Avoid taking medicines that contain aspirin, acetaminophen, ibuprofen, naproxen, or ketoprofen unless instructed by your health care professional. These medicines may hide a fever. Talk to your health care professional about your risk of cancer. You may be more at risk for certain types of cancer if you take this medicine. If you are going to need surgery or other procedure, tell your health care professional that you are using this medicine. Be careful brushing or flossing your teeth or using a toothpick because you may get an infection or bleed more easily. If you have any dental work done, tell your dentist you are receiving this medicine. What side effects may I notice from receiving this medicine? Side effects that you should report to your doctor or health care professional as soon as possible:  allergic reactions like skin rash, itching or hives, swelling of the face, lips, or tongue  breathing problems  nausea, vomiting  signs and symptoms of bleeding such as bloody or black, tarry stools; red or dark brown urine; spitting up blood or brown material that looks like coffee grounds; red spots on the skin; unusual bruising or bleeding from the eyes, gums, or nose  signs and symptoms of heart failure like fast, irregular heartbeat, sudden weight gain; swelling of the ankles, feet, hands  signs and symptoms of infection like fever; chills; cough; sore throat; pain or trouble passing urine  signs and symptoms of kidney injury like trouble passing urine or change in the amount of  urine  signs and symptoms of liver injury like dark yellow or brown urine; general ill feeling or flu-like symptoms; light-colored stools; loss of appetite; nausea; right upper belly pain; unusually weak or tired; yellowing of the eyes or skin Side effects that usually do not require medical attention (report to your doctor or health care professional if they continue or are bothersome):  confusion  decreased hearing  diarrhea  facial flushing  hair loss  headache  loss of appetite  missed menstrual periods  signs and symptoms of low red blood cells or anemia such as unusually weak or tired; feeling faint or lightheaded; falls  skin discoloration This list may not describe all possible side effects. Call your doctor for medical advice about side effects. You may report side effects to FDA at 1-800-FDA-1088. Where should I keep my medicine? This drug is given in a hospital or clinic and will not be stored at home. NOTE: This sheet is a summary. It may not cover all possible information. If you have questions about this medicine, talk to your doctor, pharmacist, or health care provider.  2020 Elsevier/Gold Standard (2018-11-14 09:53:29)  

## 2019-10-14 LAB — IGG, IGA, IGM
IgA: 186 mg/dL (ref 61–437)
IgG (Immunoglobin G), Serum: 930 mg/dL (ref 603–1613)
IgM (Immunoglobulin M), Srm: 32 mg/dL (ref 20–172)

## 2019-10-14 LAB — TESTOSTERONE: Testosterone: >1500 ng/dL — ABNORMAL HIGH (ref 264–916)

## 2019-10-16 LAB — PROTEIN ELECTROPHORESIS, SERUM, WITH REFLEX
A/G Ratio: 1.2 (ref 0.7–1.7)
Albumin ELP: 3.8 g/dL (ref 2.9–4.4)
Alpha-1-Globulin: 0.3 g/dL (ref 0.0–0.4)
Alpha-2-Globulin: 1 g/dL (ref 0.4–1.0)
Beta Globulin: 0.9 g/dL (ref 0.7–1.3)
Gamma Globulin: 0.9 g/dL (ref 0.4–1.8)
Globulin, Total: 3.1 g/dL (ref 2.2–3.9)
Total Protein ELP: 6.9 g/dL (ref 6.0–8.5)

## 2019-10-16 LAB — KAPPA/LAMBDA LIGHT CHAINS
Kappa free light chain: 49.3 mg/L — ABNORMAL HIGH (ref 3.3–19.4)
Kappa, lambda light chain ratio: 1.95 — ABNORMAL HIGH (ref 0.26–1.65)
Lambda free light chains: 25.3 mg/L (ref 5.7–26.3)

## 2019-10-19 ENCOUNTER — Inpatient Hospital Stay: Payer: 59

## 2019-10-19 ENCOUNTER — Inpatient Hospital Stay: Payer: 59 | Admitting: Hematology & Oncology

## 2019-10-20 ENCOUNTER — Inpatient Hospital Stay: Payer: 59

## 2019-10-20 ENCOUNTER — Ambulatory Visit (HOSPITAL_BASED_OUTPATIENT_CLINIC_OR_DEPARTMENT_OTHER)
Admission: RE | Admit: 2019-10-20 | Discharge: 2019-10-20 | Disposition: A | Discharge status: Home / Self Care | Payer: 59 | Source: Ambulatory Visit | Attending: Family | Admitting: Family

## 2019-10-20 ENCOUNTER — Other Ambulatory Visit: Payer: Self-pay | Admitting: Hematology & Oncology

## 2019-10-20 ENCOUNTER — Other Ambulatory Visit: Payer: Self-pay

## 2019-10-20 ENCOUNTER — Telehealth: Payer: Self-pay | Admitting: *Deleted

## 2019-10-20 ENCOUNTER — Other Ambulatory Visit: Payer: 59

## 2019-10-20 ENCOUNTER — Other Ambulatory Visit: Payer: Self-pay | Admitting: Family

## 2019-10-20 VITALS — BP 130/61 | HR 60 | Temp 98.7°F | Resp 17

## 2019-10-20 DIAGNOSIS — E349 Endocrine disorder, unspecified: Secondary | ICD-10-CM

## 2019-10-20 DIAGNOSIS — C9000 Multiple myeloma not having achieved remission: Secondary | ICD-10-CM

## 2019-10-20 DIAGNOSIS — D631 Anemia in chronic kidney disease: Secondary | ICD-10-CM

## 2019-10-20 DIAGNOSIS — Z299 Encounter for prophylactic measures, unspecified: Secondary | ICD-10-CM

## 2019-10-20 DIAGNOSIS — Z5112 Encounter for antineoplastic immunotherapy: Secondary | ICD-10-CM | POA: Diagnosis not present

## 2019-10-20 DIAGNOSIS — Z95828 Presence of other vascular implants and grafts: Secondary | ICD-10-CM

## 2019-10-20 DIAGNOSIS — M7989 Other specified soft tissue disorders: Secondary | ICD-10-CM | POA: Insufficient documentation

## 2019-10-20 MED ORDER — HYDROMORPHONE HCL 1 MG/ML IJ SOLN
INTRAMUSCULAR | Status: AC
  Filled 2019-10-20: qty 2

## 2019-10-20 MED ORDER — HEPARIN SOD (PORK) LOCK FLUSH 100 UNIT/ML IV SOLN
500.0000 [IU] | Freq: Once | INTRAVENOUS | Status: AC
  Administered 2019-10-20: 500 [IU] via INTRAVENOUS
  Filled 2019-10-20: qty 5

## 2019-10-20 MED ORDER — SODIUM CHLORIDE 0.9 % IV SOLN
INTRAVENOUS | Status: AC
  Filled 2019-10-20: qty 250

## 2019-10-20 MED ORDER — SODIUM CHLORIDE 0.9 % IV SOLN
300.0000 mg | Freq: Once | INTRAVENOUS | Status: AC
  Administered 2019-10-20: 300 mg via INTRAVENOUS
  Filled 2019-10-20: qty 15

## 2019-10-20 MED ORDER — HYDROMORPHONE HCL 1 MG/ML IJ SOLN
2.0000 mg | Freq: Once | INTRAMUSCULAR | Status: AC
  Administered 2019-10-20: 2 mg via INTRAVENOUS

## 2019-10-20 MED ORDER — SODIUM CHLORIDE 0.9% FLUSH
10.0000 mL | INTRAVENOUS | Status: AC | PRN
  Administered 2019-10-20: 10 mL via INTRAVENOUS
  Filled 2019-10-20: qty 10

## 2019-10-20 NOTE — Telephone Encounter (Addendum)
Patient aware of results  ----- Message from Eliezer Bottom, NP sent at 10/20/2019 12:49 PM EDT ----- No DVT!!!!  WOO HOO!!!!!  ----- Message ----- From: Interface, Rad Results In Sent: 10/20/2019  12:13 PM EDT To: Eliezer Bottom, NP

## 2019-10-20 NOTE — Patient Instructions (Signed)

## 2019-10-20 NOTE — Addendum Note (Signed)
Addended by: Johny Drilling on: 10/20/2019 10:56 AM   Modules accepted: Orders

## 2019-10-20 NOTE — Addendum Note (Signed)
Addended by: Smiley Houseman F on: 10/20/2019 10:47 AM   Modules accepted: Orders, SmartSet

## 2019-11-03 ENCOUNTER — Ambulatory Visit: Payer: 59

## 2019-11-03 ENCOUNTER — Inpatient Hospital Stay: Payer: 59 | Admitting: Hematology & Oncology

## 2019-11-03 ENCOUNTER — Inpatient Hospital Stay: Payer: 59

## 2019-11-10 ENCOUNTER — Inpatient Hospital Stay: Payer: 59 | Attending: Hematology & Oncology

## 2019-11-10 ENCOUNTER — Inpatient Hospital Stay: Payer: 59

## 2019-11-10 ENCOUNTER — Other Ambulatory Visit: Payer: Self-pay

## 2019-11-10 ENCOUNTER — Ambulatory Visit: Payer: 59 | Attending: Internal Medicine

## 2019-11-10 ENCOUNTER — Inpatient Hospital Stay (HOSPITAL_BASED_OUTPATIENT_CLINIC_OR_DEPARTMENT_OTHER): Payer: 59 | Admitting: Family

## 2019-11-10 ENCOUNTER — Encounter: Payer: Self-pay | Admitting: Family

## 2019-11-10 VITALS — BP 127/67 | HR 76 | Temp 97.6°F | Resp 16 | Ht 71.0 in | Wt 186.1 lb

## 2019-11-10 DIAGNOSIS — Z5112 Encounter for antineoplastic immunotherapy: Secondary | ICD-10-CM | POA: Insufficient documentation

## 2019-11-10 DIAGNOSIS — C9000 Multiple myeloma not having achieved remission: Secondary | ICD-10-CM | POA: Insufficient documentation

## 2019-11-10 DIAGNOSIS — D631 Anemia in chronic kidney disease: Secondary | ICD-10-CM | POA: Diagnosis not present

## 2019-11-10 DIAGNOSIS — N189 Chronic kidney disease, unspecified: Secondary | ICD-10-CM

## 2019-11-10 DIAGNOSIS — E349 Endocrine disorder, unspecified: Secondary | ICD-10-CM | POA: Diagnosis not present

## 2019-11-10 DIAGNOSIS — D500 Iron deficiency anemia secondary to blood loss (chronic): Secondary | ICD-10-CM

## 2019-11-10 DIAGNOSIS — E291 Testicular hypofunction: Secondary | ICD-10-CM | POA: Insufficient documentation

## 2019-11-10 DIAGNOSIS — Z299 Encounter for prophylactic measures, unspecified: Secondary | ICD-10-CM

## 2019-11-10 DIAGNOSIS — Z5111 Encounter for antineoplastic chemotherapy: Secondary | ICD-10-CM | POA: Diagnosis present

## 2019-11-10 DIAGNOSIS — Z23 Encounter for immunization: Secondary | ICD-10-CM

## 2019-11-10 LAB — CBC WITH DIFFERENTIAL (CANCER CENTER ONLY)
Abs Immature Granulocytes: 0.02 10*3/uL (ref 0.00–0.07)
Basophils Absolute: 0.1 10*3/uL (ref 0.0–0.1)
Basophils Relative: 1 %
Eosinophils Absolute: 0.2 10*3/uL (ref 0.0–0.5)
Eosinophils Relative: 4 %
HCT: 29.2 % — ABNORMAL LOW (ref 39.0–52.0)
Hemoglobin: 9.5 g/dL — ABNORMAL LOW (ref 13.0–17.0)
Immature Granulocytes: 1 %
Lymphocytes Relative: 21 %
Lymphs Abs: 0.9 10*3/uL (ref 0.7–4.0)
MCH: 26.5 pg (ref 26.0–34.0)
MCHC: 32.5 g/dL (ref 30.0–36.0)
MCV: 81.6 fL (ref 80.0–100.0)
Monocytes Absolute: 0.5 10*3/uL (ref 0.1–1.0)
Monocytes Relative: 12 %
Neutro Abs: 2.5 10*3/uL (ref 1.7–7.7)
Neutrophils Relative %: 61 %
Platelet Count: 172 10*3/uL (ref 150–400)
RBC: 3.58 MIL/uL — ABNORMAL LOW (ref 4.22–5.81)
RDW: 16.4 % — ABNORMAL HIGH (ref 11.5–15.5)
WBC Count: 4.1 10*3/uL (ref 4.0–10.5)
nRBC: 0 % (ref 0.0–0.2)

## 2019-11-10 LAB — CMP (CANCER CENTER ONLY)
ALT: 15 U/L (ref 0–44)
AST: 19 U/L (ref 15–41)
Albumin: 4.2 g/dL (ref 3.5–5.0)
Alkaline Phosphatase: 78 U/L (ref 38–126)
Anion gap: 7 (ref 5–15)
BUN: 19 mg/dL (ref 8–23)
CO2: 31 mmol/L (ref 22–32)
Calcium: 9.4 mg/dL (ref 8.9–10.3)
Chloride: 103 mmol/L (ref 98–111)
Creatinine: 2.21 mg/dL — ABNORMAL HIGH (ref 0.61–1.24)
GFR, Est AFR Am: 34 mL/min — ABNORMAL LOW (ref >60)
GFR, Estimated: 29 mL/min — ABNORMAL LOW (ref >60)
Glucose, Bld: 90 mg/dL (ref 70–99)
Potassium: 3.4 mmol/L — ABNORMAL LOW (ref 3.5–5.1)
Sodium: 141 mmol/L (ref 135–145)
Total Bilirubin: 0.4 mg/dL (ref 0.3–1.2)
Total Protein: 7 g/dL (ref 6.5–8.1)

## 2019-11-10 LAB — IRON AND TIBC
Iron: 112 ug/dL (ref 42–163)
Saturation Ratios: 53 % (ref 20–55)
TIBC: 214 ug/dL (ref 202–409)
UIBC: 101 ug/dL — ABNORMAL LOW (ref 117–376)

## 2019-11-10 LAB — LACTATE DEHYDROGENASE: LDH: 221 U/L — ABNORMAL HIGH (ref 98–192)

## 2019-11-10 LAB — FERRITIN: Ferritin: 2343 ng/mL — ABNORMAL HIGH (ref 24–336)

## 2019-11-10 MED ORDER — SODIUM CHLORIDE 0.9 % IV SOLN
Freq: Once | INTRAVENOUS | Status: AC
  Filled 2019-11-10: qty 250

## 2019-11-10 MED ORDER — HEPARIN SOD (PORK) LOCK FLUSH 100 UNIT/ML IV SOLN
500.0000 [IU] | Freq: Once | INTRAVENOUS | Status: AC | PRN
  Administered 2019-11-10: 500 [IU]
  Filled 2019-11-10: qty 5

## 2019-11-10 MED ORDER — HYDROMORPHONE HCL 1 MG/ML IJ SOLN
2.0000 mg | Freq: Once | INTRAMUSCULAR | Status: AC
  Administered 2019-11-10: 2 mg via INTRAVENOUS

## 2019-11-10 MED ORDER — PALONOSETRON HCL INJECTION 0.25 MG/5ML
INTRAVENOUS | Status: AC
  Filled 2019-11-10: qty 5

## 2019-11-10 MED ORDER — TESTOSTERONE CYPIONATE 200 MG/ML IM SOLN
INTRAMUSCULAR | Status: AC
  Filled 2019-11-10: qty 1

## 2019-11-10 MED ORDER — SODIUM CHLORIDE 0.9 % IV SOLN
300.0000 mg/m2 | Freq: Once | INTRAVENOUS | Status: AC
  Administered 2019-11-10: 620 mg via INTRAVENOUS
  Filled 2019-11-10: qty 31

## 2019-11-10 MED ORDER — EPOETIN ALFA-EPBX 40000 UNIT/ML IJ SOLN
40000.0000 [IU] | Freq: Once | INTRAMUSCULAR | Status: AC
  Administered 2019-11-10: 40000 [IU] via SUBCUTANEOUS

## 2019-11-10 MED ORDER — PALONOSETRON HCL INJECTION 0.25 MG/5ML
0.2500 mg | Freq: Once | INTRAVENOUS | Status: AC
  Administered 2019-11-10: 0.25 mg via INTRAVENOUS

## 2019-11-10 MED ORDER — SODIUM CHLORIDE 0.9 % IV SOLN: 300.0000 mg | Freq: Once | INTRAVENOUS | Status: DC

## 2019-11-10 MED ORDER — SODIUM CHLORIDE 0.9 % IV SOLN
10.0000 mg | Freq: Once | INTRAVENOUS | Status: AC
  Administered 2019-11-10: 10 mg via INTRAVENOUS
  Filled 2019-11-10: qty 10

## 2019-11-10 MED ORDER — HYDROMORPHONE HCL 1 MG/ML IJ SOLN
INTRAMUSCULAR | Status: AC
  Filled 2019-11-10: qty 1

## 2019-11-10 MED ORDER — DEXTROSE 5 % IV SOLN
28.6000 mg/m2 | Freq: Once | INTRAVENOUS | Status: AC
  Administered 2019-11-10: 60 mg via INTRAVENOUS
  Filled 2019-11-10: qty 30

## 2019-11-10 MED ORDER — TESTOSTERONE CYPIONATE 200 MG/ML IM SOLN
400.0000 mg | Freq: Once | INTRAMUSCULAR | Status: AC
  Administered 2019-11-10: 400 mg via INTRAMUSCULAR

## 2019-11-10 MED ORDER — SODIUM CHLORIDE 0.9% FLUSH
10.0000 mL | INTRAVENOUS | Status: DC | PRN
  Administered 2019-11-10: 10 mL
  Filled 2019-11-10: qty 10

## 2019-11-10 NOTE — Progress Notes (Signed)
Okay to treat with SCr 2.35 today per Dr. [name].

## 2019-11-10 NOTE — Patient Instructions (Signed)

## 2019-11-10 NOTE — Patient Instructions (Signed)
Cyclophosphamide Injection What is this medicine? CYCLOPHOSPHAMIDE (sye kloe FOSS fa mide) is a chemotherapy drug. It slows the growth of cancer cells. This medicine is used to treat many types of cancer like lymphoma, myeloma, leukemia, breast cancer, and ovarian cancer, to name a few. This medicine may be used for other purposes; ask your health care provider or pharmacist if you have questions. COMMON BRAND NAME(S): Cytoxan, Neosar What should I tell my health care provider before I take this medicine? They need to know if you have any of these conditions:  heart disease  history of irregular heartbeat  infection  kidney disease  liver disease  low blood counts, like white cells, platelets, or red blood cells  on hemodialysis  recent or ongoing radiation therapy  scarring or thickening of the lungs  trouble passing urine  an unusual or allergic reaction to cyclophosphamide, other medicines, foods, dyes, or preservatives  pregnant or trying to get pregnant  breast-feeding How should I use this medicine? This drug is usually given as an injection into a vein or muscle or by infusion into a vein. It is administered in a hospital or clinic by a specially trained health care professional. Talk to your pediatrician regarding the use of this medicine in children. Special care may be needed. Overdosage: If you think you have taken too much of this medicine contact a poison control center or emergency room at once. NOTE: This medicine is only for you. Do not share this medicine with others. What if I miss a dose? It is important not to miss your dose. Call your doctor or health care professional if you are unable to keep an appointment. What may interact with this medicine?  amphotericin B  azathioprine  certain antivirals for HIV or hepatitis  certain medicines for blood pressure, heart disease, irregular heart beat  certain medicines that treat or prevent blood clots  like warfarin  certain other medicines for cancer  cyclosporine  etanercept  indomethacin  medicines that relax muscles for surgery  medicines to increase blood counts  metronidazole This list may not describe all possible interactions. Give your health care provider a list of all the medicines, herbs, non-prescription drugs, or dietary supplements you use. Also tell them if you smoke, drink alcohol, or use illegal drugs. Some items may interact with your medicine. What should I watch for while using this medicine? Your condition will be monitored carefully while you are receiving this medicine. You may need blood work done while you are taking this medicine. Drink water or other fluids as directed. Urinate often, even at night. Some products may contain alcohol. Ask your health care professional if this medicine contains alcohol. Be sure to tell all health care professionals you are taking this medicine. Certain medicines, like metronidazole and disulfiram, can cause an unpleasant reaction when taken with alcohol. The reaction includes flushing, headache, nausea, vomiting, sweating, and increased thirst. The reaction can last from 30 minutes to several hours. Do not become pregnant while taking this medicine or for 1 year after stopping it. Women should inform their health care professional if they wish to become pregnant or think they might be pregnant. Men should not father a child while taking this medicine and for 4 months after stopping it. There is potential for serious side effects to an unborn child. Talk to your health care professional for more information. Do not breast-feed an infant while taking this medicine or for 1 week after stopping it. This medicine has  caused ovarian failure in some women. This medicine may make it more difficult to get pregnant. Talk to your health care professional if you are concerned about your fertility. This medicine has caused decreased sperm  counts in some men. This may make it more difficult to father a child. Talk to your health care professional if you are concerned about your fertility. Call your health care professional for advice if you get a fever, chills, or sore throat, or other symptoms of a cold or flu. Do not treat yourself. This medicine decreases your body's ability to fight infections. Try to avoid being around people who are sick. Avoid taking medicines that contain aspirin, acetaminophen, ibuprofen, naproxen, or ketoprofen unless instructed by your health care professional. These medicines may hide a fever. Talk to your health care professional about your risk of cancer. You may be more at risk for certain types of cancer if you take this medicine. If you are going to need surgery or other procedure, tell your health care professional that you are using this medicine. Be careful brushing or flossing your teeth or using a toothpick because you may get an infection or bleed more easily. If you have any dental work done, tell your dentist you are receiving this medicine. What side effects may I notice from receiving this medicine? Side effects that you should report to your doctor or health care professional as soon as possible:  allergic reactions like skin rash, itching or hives, swelling of the face, lips, or tongue  breathing problems  nausea, vomiting  signs and symptoms of bleeding such as bloody or black, tarry stools; red or dark brown urine; spitting up blood or brown material that looks like coffee grounds; red spots on the skin; unusual bruising or bleeding from the eyes, gums, or nose  signs and symptoms of heart failure like fast, irregular heartbeat, sudden weight gain; swelling of the ankles, feet, hands  signs and symptoms of infection like fever; chills; cough; sore throat; pain or trouble passing urine  signs and symptoms of kidney injury like trouble passing urine or change in the amount of  urine  signs and symptoms of liver injury like dark yellow or brown urine; general ill feeling or flu-like symptoms; light-colored stools; loss of appetite; nausea; right upper belly pain; unusually weak or tired; yellowing of the eyes or skin Side effects that usually do not require medical attention (report to your doctor or health care professional if they continue or are bothersome):  confusion  decreased hearing  diarrhea  facial flushing  hair loss  headache  loss of appetite  missed menstrual periods  signs and symptoms of low red blood cells or anemia such as unusually weak or tired; feeling faint or lightheaded; falls  skin discoloration This list may not describe all possible side effects. Call your doctor for medical advice about side effects. You may report side effects to FDA at 1-800-FDA-1088. Where should I keep my medicine? This drug is given in a hospital or clinic and will not be stored at home. NOTE: This sheet is a summary. It may not cover all possible information. If you have questions about this medicine, talk to your doctor, pharmacist, or health care provider.  2020 Elsevier/Gold Standard (2018-11-14 09:53:29)  

## 2019-11-10 NOTE — Progress Notes (Signed)
° °  Covid-19 Vaccination Clinic  Name:  Raiford Fetterman    MRN: [id number] DOB: March 28, 1950  11/10/2019  Mr. Copenhaver was observed post Covid-19 immunization for 15 minutes without incident. He was provided with Vaccine Information Sheet and instruction to access the V-Safe system. Vaccinated By: Jeannene Patella Ward  Mr. Mcginley was instructed to call 911 with any severe reactions post vaccine:  Difficulty breathing   Swelling of face and throat   A fast heartbeat   A bad rash all over body   Dizziness and weakness

## 2019-11-10 NOTE — Progress Notes (Signed)
Hematology and Oncology Follow Up Visit  [name] [id number] 1951/02/09 69 y.o. 11/10/2019   Principle Diagnosis:  IgG kappa myeloma  Anemia secondary to renal insufficiency  Intermittent iron - deficiency anemia  Hypotestosteronemia  Current Therapy: Aredia 60 mg IV q 3 months-- next dose11/2021 Aranesp 300 mcg subcu as needed for hemoglobin less than 11 DepoTestosterone400 mg q4weeks IV iron as indicated Kyprolis/Cytoxan - s/p cycle25   Interim History:  [name] is here today for follow-up and treatment. He is having a lot of arthritic pain in his joints and back. He recently saw his orthopedist and got a steroid injection which states has helped some with the pain.  August M-spike not observed, IgG level 930 mg/dL and kappa light chains 49.3 mg/L.  He same his nephrologist 2 weeks ago and had repeat CT  Which showed bilateral renal cysts.  Creatinine today is 2.21 (last visit 2.35) and BUN 19.  He has GERD and is not currently taking anything. He intermittently has a cough and occasionally has nausea with it.  No fever, chills, n/v, cough, rash, dizziness, SOB< chest pain, palpitations, abdominal pain or changes in bowel or bladder habits.  The swelling in his feet and ankles comes and goes. Pedal pulses 2+ on exam. No pitting edema or erythema.  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  No falls or syncopal episodes to report.   ECOG Performance Status: 1 - Symptomatic but completely ambulatory  Medications:  Allergies as of 11/10/2019      Reactions   Iodinated Diagnostic Agents Rash   Patient states he was instructed not to take IV contrast.  In 2008 he had an unknown reaction, and was told not to take it again.  He was also told not to take it due to his kidneys.   Iodine Anxiety, Rash, Other (See Comments)   Didn't feel right "instructed not to take per MD--something with his port"      Medication List       Accurate as of November 10, 2019  8:58 AM. If you have any questions, ask your nurse or doctor.        amLODipine 5 MG tablet Commonly known as: NORVASC Take 5 mg by mouth 2 (two) times daily.   atorvastatin 40 MG tablet Commonly known as: LIPITOR Take 40 mg by mouth daily at 6 PM.   Carafate 1 GM/10ML suspension Generic drug: sucralfate Take 1 g by mouth 4 (four) times daily.   dicyclomine 10 MG capsule Commonly known as: BENTYL Take 1 capsule (10 mg total) by mouth 3 (three) times daily before meals.   gabapentin 300 MG capsule Commonly known as: NEURONTIN TAKE 1 CAPSULE BY MOUTH THREE TIMES A DAY What changed:   how much to take  how to take this  when to take this  additional instructions   glimepiride 2 MG tablet Commonly known as: AMARYL Take 2 mg by mouth daily with breakfast.   lactulose 10 GM/15ML solution Commonly known as: CHRONULAC Take 30 mLs (20 g total) by mouth daily.   losartan 50 MG tablet Commonly known as: COZAAR Take 50 mg by mouth daily.   metFORMIN 500 MG tablet Commonly known as: GLUCOPHAGE Take 500 mg by mouth 2 (two) times daily.   metoprolol succinate 25 MG 24 hr tablet Commonly known as: TOPROL-XL Take 25 mg by mouth daily.   mirabegron ER 50 MG Tb24 tablet Commonly known as: MYRBETRIQ Take 50 mg by mouth daily.  morphine 30 MG 12 hr tablet Commonly known as: MS CONTIN Take 30 mg by mouth 2 (two) times daily.   omeprazole 40 MG capsule Commonly known as: PRILOSEC TAKE 1 CAPSULE BY MOUTH TWICE A DAY   ondansetron 8 MG tablet Commonly known as: ZOFRAN TAKE 1 TABLET(8 MG) BY MOUTH TWICE DAILY AS NEEDED FOR NAUSEA OR VOMITING   oxyCODONE-acetaminophen 10-325 MG tablet Commonly known as: PERCOCET Take 1 tablet by mouth every 6 (six) hours as needed for pain.   pioglitazone 30 MG tablet Commonly known as: ACTOS Take 30 mg by mouth daily.   prochlorperazine 10 MG tablet Commonly known as: COMPAZINE take 1 tablet by mouth every 6 hours if needed  for nausea and vomiting   Restasis 0.05 % ophthalmic emulsion Generic drug: cycloSPORINE Place 1 drop into both eyes daily.   SUMAtriptan 100 MG tablet Commonly known as: IMITREX Take 100 mg by mouth daily.   topiramate 25 MG tablet Commonly known as: TOPAMAX Take 25 mg by mouth daily.   torsemide 20 MG tablet Commonly known as: DEMADEX Take 40 mg by mouth daily.   Trulicity 3.15 VV/6.1YW Sopn Generic drug: Dulaglutide Inject 0.75 mg into the skin every Saturday.   Vitamin D (Ergocalciferol) 1.25 MG (50000 UNIT) Caps capsule Commonly known as: DRISDOL Take 50,000 Units by mouth every Saturday.       Allergies:  Allergies  Allergen Reactions   Iodinated Diagnostic Agents Rash    Patient states he was instructed not to take IV contrast.  In 2008 he had an unknown reaction, and was told not to take it again.  He was also told not to take it due to his kidneys.   Iodine Anxiety, Rash and Other (See Comments)    Didn't feel right "instructed not to take per MD--something with his port"     Past Medical History, Surgical history, Social history, and Family History were reviewed and updated.  Review of Systems: All other 10 point review of systems is negative.   Physical Exam:  vitals were not taken for this visit.   Wt Readings from Last 3 Encounters:  10/13/19 189 lb 14.4 oz (86.1 kg)  09/22/19 191 lb (86.6 kg)  08/24/19 185 lb (83.9 kg)    Ocular: Sclerae unicteric, pupils equal, round and reactive to light Ear-nose-throat: Oropharynx clear, dentition fair Lymphatic: No cervical, supraclavicular or axillary adenopathy Lungs no rales or rhonchi, good excursion bilaterally Heart regular rate and rhythm, no murmur appreciated Abd soft, nontender, positive bowel sounds, no liver or spleen tip palpated on exam, no fluid wave  MSK no focal spinal tenderness, no joint edema Neuro: non-focal, well-oriented, appropriate affect Breasts: Deferred   Lab Results    Component Value Date   WBC 4.1 11/10/2019   HGB 9.5 (L) 11/10/2019   HCT 29.2 (L) 11/10/2019   MCV 81.6 11/10/2019   PLT 172 11/10/2019   Lab Results  Component Value Date   FERRITIN 1,600 (H) 10/13/2019   IRON 72 10/13/2019   TIBC 255 10/13/2019   UIBC 183 10/13/2019   IRONPCTSAT 28 10/13/2019   Lab Results  Component Value Date   RETICCTPCT 1.1 07/27/2019   RBC 3.58 (L) 11/10/2019   RETICCTABS 27.5 03/21/2014   Lab Results  Component Value Date   KPAFRELGTCHN 49.3 (H) 10/13/2019   LAMBDASER 25.3 10/13/2019   KAPLAMBRATIO 1.95 (H) 10/13/2019   Lab Results  Component Value Date   IGGSERUM 930 10/13/2019   IGA 186 10/13/2019   IGMSERUM  32 10/13/2019   Lab Results  Component Value Date   TOTALPROTELP 6.9 10/13/2019   ALBUMINELP 3.8 10/13/2019   A1GS 0.3 10/13/2019   A2GS 1.0 10/13/2019   BETS 0.9 10/13/2019   BETA2SER 0.5 11/07/2014   GAMS 0.9 10/13/2019   MSPIKE Not Observed 10/13/2019   SPEI Comment 08/24/2019     Chemistry      Component Value Date/Time   NA 141 11/10/2019 0817   NA 143 02/22/2017 0744   K 3.4 (L) 11/10/2019 0817   K 4.0 02/22/2017 0744   CL 103 11/10/2019 0817   CL 106 02/22/2017 0744   CO2 31 11/10/2019 0817   CO2 27 02/22/2017 0744   BUN 19 11/10/2019 0817   BUN 26 (H) 02/22/2017 0744   CREATININE 2.21 (H) 11/10/2019 0817   CREATININE 2.3 (H) 02/22/2017 0744      Component Value Date/Time   CALCIUM 9.4 11/10/2019 0817   CALCIUM 9.3 02/22/2017 0744   ALKPHOS 78 11/10/2019 0817   ALKPHOS 80 02/22/2017 0744   AST 19 11/10/2019 0817   ALT 15 11/10/2019 0817   ALT 22 02/22/2017 0744   BILITOT 0.4 11/10/2019 0817       Impression and Plan: Mr. Baria is a very pleasant 69 yo African American gentleman with IgG kappa myeloma. We will proceed with treatment today as planned.  He will try glucosamine chondroitin for his joint aches and pains and see if this gives him some relief.  He received Aranesp today as well for Hgb  9.5. He also received Testosterone.  We will see him again in 4 weeks.  He can contact our office with any questions or concerns.   Laverna Peace, NP 9/17/20218:58 AM

## 2019-11-11 LAB — TESTOSTERONE: Testosterone: 424 ng/dL (ref 264–916)

## 2019-11-11 LAB — IGG, IGA, IGM
IgA: 183 mg/dL (ref 61–437)
IgG (Immunoglobin G), Serum: 922 mg/dL (ref 603–1613)
IgM (Immunoglobulin M), Srm: 33 mg/dL (ref 20–172)

## 2019-11-13 LAB — PROTEIN ELECTROPHORESIS, SERUM
A/G Ratio: 1.2 (ref 0.7–1.7)
Albumin ELP: 3.7 g/dL (ref 2.9–4.4)
Alpha-1-Globulin: 0.2 g/dL (ref 0.0–0.4)
Alpha-2-Globulin: 0.9 g/dL (ref 0.4–1.0)
Beta Globulin: 0.9 g/dL (ref 0.7–1.3)
Gamma Globulin: 0.9 g/dL (ref 0.4–1.8)
Globulin, Total: 3 g/dL (ref 2.2–3.9)
Total Protein ELP: 6.7 g/dL (ref 6.0–8.5)

## 2019-11-13 LAB — KAPPA/LAMBDA LIGHT CHAINS
Kappa free light chain: 43.9 mg/L — ABNORMAL HIGH (ref 3.3–19.4)
Kappa, lambda light chain ratio: 2.15 — ABNORMAL HIGH (ref 0.26–1.65)
Lambda free light chains: 20.4 mg/L (ref 5.7–26.3)

## 2019-11-14 ENCOUNTER — Other Ambulatory Visit: Payer: Self-pay | Admitting: Hematology & Oncology

## 2019-11-14 DIAGNOSIS — R1319 Other dysphagia: Secondary | ICD-10-CM

## 2019-11-14 DIAGNOSIS — R1084 Generalized abdominal pain: Secondary | ICD-10-CM

## 2019-11-16 ENCOUNTER — Ambulatory Visit: Payer: 59

## 2019-11-16 ENCOUNTER — Other Ambulatory Visit: Payer: 59

## 2019-11-16 ENCOUNTER — Ambulatory Visit: Payer: 59 | Admitting: Hematology & Oncology

## 2019-12-07 ENCOUNTER — Other Ambulatory Visit: Payer: 59

## 2019-12-07 ENCOUNTER — Inpatient Hospital Stay: Payer: 59

## 2019-12-07 ENCOUNTER — Ambulatory Visit: Payer: 59

## 2019-12-07 ENCOUNTER — Other Ambulatory Visit: Payer: Self-pay

## 2019-12-07 ENCOUNTER — Inpatient Hospital Stay (HOSPITAL_BASED_OUTPATIENT_CLINIC_OR_DEPARTMENT_OTHER): Payer: 59 | Admitting: Hematology & Oncology

## 2019-12-07 ENCOUNTER — Ambulatory Visit: Payer: 59 | Admitting: Hematology & Oncology

## 2019-12-07 ENCOUNTER — Inpatient Hospital Stay: Payer: 59 | Attending: Hematology & Oncology

## 2019-12-07 ENCOUNTER — Encounter: Payer: Self-pay | Admitting: Hematology & Oncology

## 2019-12-07 VITALS — BP 166/71 | HR 89 | Temp 98.4°F | Resp 18 | Ht 71.0 in | Wt 187.0 lb

## 2019-12-07 DIAGNOSIS — C9000 Multiple myeloma not having achieved remission: Secondary | ICD-10-CM | POA: Diagnosis not present

## 2019-12-07 DIAGNOSIS — Z5111 Encounter for antineoplastic chemotherapy: Secondary | ICD-10-CM | POA: Insufficient documentation

## 2019-12-07 DIAGNOSIS — E119 Type 2 diabetes mellitus without complications: Secondary | ICD-10-CM | POA: Insufficient documentation

## 2019-12-07 DIAGNOSIS — E349 Endocrine disorder, unspecified: Secondary | ICD-10-CM

## 2019-12-07 DIAGNOSIS — Z5112 Encounter for antineoplastic immunotherapy: Secondary | ICD-10-CM | POA: Insufficient documentation

## 2019-12-07 DIAGNOSIS — D631 Anemia in chronic kidney disease: Secondary | ICD-10-CM

## 2019-12-07 DIAGNOSIS — N189 Chronic kidney disease, unspecified: Secondary | ICD-10-CM

## 2019-12-07 DIAGNOSIS — D509 Iron deficiency anemia, unspecified: Secondary | ICD-10-CM | POA: Insufficient documentation

## 2019-12-07 DIAGNOSIS — K219 Gastro-esophageal reflux disease without esophagitis: Secondary | ICD-10-CM | POA: Insufficient documentation

## 2019-12-07 DIAGNOSIS — D500 Iron deficiency anemia secondary to blood loss (chronic): Secondary | ICD-10-CM | POA: Diagnosis not present

## 2019-12-07 DIAGNOSIS — Z299 Encounter for prophylactic measures, unspecified: Secondary | ICD-10-CM

## 2019-12-07 LAB — CMP (CANCER CENTER ONLY)
ALT: 38 U/L (ref 0–44)
AST: 26 U/L (ref 15–41)
Albumin: 4.5 g/dL (ref 3.5–5.0)
Alkaline Phosphatase: 72 U/L (ref 38–126)
Anion gap: 10 (ref 5–15)
BUN: 39 mg/dL — ABNORMAL HIGH (ref 8–23)
CO2: 29 mmol/L (ref 22–32)
Calcium: 9.4 mg/dL (ref 8.9–10.3)
Chloride: 102 mmol/L (ref 98–111)
Creatinine: 2.33 mg/dL — ABNORMAL HIGH (ref 0.61–1.24)
GFR, Estimated: 27 mL/min — ABNORMAL LOW (ref >60)
Glucose, Bld: 99 mg/dL (ref 70–99)
Potassium: 3.7 mmol/L (ref 3.5–5.1)
Sodium: 141 mmol/L (ref 135–145)
Total Bilirubin: 0.3 mg/dL (ref 0.3–1.2)
Total Protein: 7 g/dL (ref 6.5–8.1)

## 2019-12-07 LAB — CBC WITH DIFFERENTIAL (CANCER CENTER ONLY)
Abs Immature Granulocytes: 0.03 10*3/uL (ref 0.00–0.07)
Basophils Absolute: 0 10*3/uL (ref 0.0–0.1)
Basophils Relative: 1 %
Eosinophils Absolute: 0.1 10*3/uL (ref 0.0–0.5)
Eosinophils Relative: 2 %
HCT: 32.8 % — ABNORMAL LOW (ref 39.0–52.0)
Hemoglobin: 10.4 g/dL — ABNORMAL LOW (ref 13.0–17.0)
Immature Granulocytes: 1 %
Lymphocytes Relative: 12 %
Lymphs Abs: 0.7 10*3/uL (ref 0.7–4.0)
MCH: 26 pg (ref 26.0–34.0)
MCHC: 31.7 g/dL (ref 30.0–36.0)
MCV: 82 fL (ref 80.0–100.0)
Monocytes Absolute: 0.6 10*3/uL (ref 0.1–1.0)
Monocytes Relative: 11 %
Neutro Abs: 4.3 10*3/uL (ref 1.7–7.7)
Neutrophils Relative %: 73 %
Platelet Count: 160 10*3/uL (ref 150–400)
RBC: 4 MIL/uL — ABNORMAL LOW (ref 4.22–5.81)
RDW: 17.6 % — ABNORMAL HIGH (ref 11.5–15.5)
WBC Count: 5.7 10*3/uL (ref 4.0–10.5)
nRBC: 0 % (ref 0.0–0.2)

## 2019-12-07 LAB — IRON AND TIBC
Iron: 114 ug/dL (ref 42–163)
Saturation Ratios: 43 % (ref 20–55)
TIBC: 268 ug/dL (ref 202–409)
UIBC: 154 ug/dL (ref 117–376)

## 2019-12-07 LAB — LACTATE DEHYDROGENASE: LDH: 237 U/L — ABNORMAL HIGH (ref 98–192)

## 2019-12-07 LAB — FERRITIN: Ferritin: 2428 ng/mL — ABNORMAL HIGH (ref 24–336)

## 2019-12-07 MED ORDER — SODIUM CHLORIDE 0.9 % IV SOLN
Freq: Once | INTRAVENOUS | Status: DC
  Filled 2019-12-07: qty 250

## 2019-12-07 MED ORDER — SODIUM CHLORIDE 0.9% FLUSH
10.0000 mL | INTRAVENOUS | Status: DC | PRN
  Administered 2019-12-07: 10 mL
  Filled 2019-12-07: qty 10

## 2019-12-07 MED ORDER — PALONOSETRON HCL INJECTION 0.25 MG/5ML
0.2500 mg | Freq: Once | INTRAVENOUS | Status: AC
  Administered 2019-12-07: 0.25 mg via INTRAVENOUS

## 2019-12-07 MED ORDER — HEPARIN SOD (PORK) LOCK FLUSH 100 UNIT/ML IV SOLN
500.0000 [IU] | Freq: Once | INTRAVENOUS | Status: AC | PRN
  Administered 2019-12-07: 500 [IU]
  Filled 2019-12-07: qty 5

## 2019-12-07 MED ORDER — EPOETIN ALFA-EPBX 40000 UNIT/ML IJ SOLN
INTRAMUSCULAR | Status: AC
  Filled 2019-12-07: qty 1

## 2019-12-07 MED ORDER — TESTOSTERONE CYPIONATE 200 MG/ML IM SOLN
400.0000 mg | Freq: Once | INTRAMUSCULAR | Status: AC
  Administered 2019-12-07: 400 mg via INTRAMUSCULAR

## 2019-12-07 MED ORDER — DEXTROSE 5 % IV SOLN
29.0000 mg/m2 | Freq: Once | INTRAVENOUS | Status: AC
  Administered 2019-12-07: 60 mg via INTRAVENOUS
  Filled 2019-12-07: qty 30

## 2019-12-07 MED ORDER — SODIUM CHLORIDE 0.9 % IV SOLN
10.0000 mg | Freq: Once | INTRAVENOUS | Status: AC
  Administered 2019-12-07: 10 mg via INTRAVENOUS
  Filled 2019-12-07: qty 10

## 2019-12-07 MED ORDER — PALONOSETRON HCL INJECTION 0.25 MG/5ML
INTRAVENOUS | Status: AC
  Filled 2019-12-07: qty 5

## 2019-12-07 MED ORDER — SODIUM CHLORIDE 0.9 % IV SOLN
300.0000 mg/m2 | Freq: Once | INTRAVENOUS | Status: AC
  Administered 2019-12-07: 620 mg via INTRAVENOUS
  Filled 2019-12-07: qty 31

## 2019-12-07 MED ORDER — TESTOSTERONE CYPIONATE 200 MG/ML IM SOLN
INTRAMUSCULAR | Status: AC
  Filled 2019-12-07: qty 1

## 2019-12-07 MED ORDER — HYDROMORPHONE HCL 1 MG/ML IJ SOLN
2.0000 mg | Freq: Once | INTRAMUSCULAR | Status: AC
  Administered 2019-12-07: 2 mg via INTRAVENOUS

## 2019-12-07 MED ORDER — SODIUM CHLORIDE 0.9 % IV SOLN
Freq: Once | INTRAVENOUS | Status: AC
  Filled 2019-12-07: qty 250

## 2019-12-07 MED ORDER — HYDROMORPHONE HCL 1 MG/ML IJ SOLN
INTRAMUSCULAR | Status: AC
  Filled 2019-12-07: qty 2

## 2019-12-07 MED ORDER — EPOETIN ALFA-EPBX 40000 UNIT/ML IJ SOLN: 40000.0000 [IU] | Freq: Once | INTRAMUSCULAR | Status: DC

## 2019-12-07 NOTE — Progress Notes (Signed)
Ok to treat despite labs per MD 

## 2019-12-07 NOTE — Progress Notes (Signed)
Pt. Stable upon discharge. °

## 2019-12-07 NOTE — Progress Notes (Signed)
Hematology and Oncology Follow Up Visit  [name] [id number] March 11, 1950 69 y.o. 12/07/2019   Principle Diagnosis:  IgG kappa myeloma  Anemia secondary to renal insufficiency  Intermittent iron - deficiency anemia  Hypotestosteronemia  Current Therapy: Aredia 60 mg IV q 3 months-- next dose11/2021 Aranesp 300 mcg subcu as needed for hemoglobin less than 11 DepoTestosterone400 mg q4weeks IV iron as indicated Kyprolis/Cytoxan - s/p cycle#26   Interim History:  [name] is here today for follow-up and treatment. He is having a lot of arthritic pain in his joints and back.  He said when the weather turns cold, his joints seem to hurt more.  He recently saw his rheumatologist.  She put him on a new medication.  He is not going to take it because she told that it might affect his kidneys.  As far as the myeloma is concerned, this has been holding pretty steady.  He did not have a monoclonal spike more saw him in September.  His IgG level is 922 mg/dL.  The kappa light chain was 4.4 mg/dL.  His diabetes has always been a problem.  This is caused issues for him.  He does have the GERD.  He is on antacids for this.  He did get the COVID vaccine booster shot.  He had a reaction to it.  He did not feel well for about 4 or 5 days.    Overall, his performance status is ECOG 1.     Allergies as of 12/07/2019      Reactions   Iodinated Diagnostic Agents Rash   Patient states he was instructed not to take IV contrast.  In 2008 he had an unknown reaction, and was told not to take it again.  He was also told not to take it due to his kidneys.   Iodine Anxiety, Rash, Other (See Comments)   Didn't feel right "instructed not to take per MD--something with his port"      Medication List       Accurate as of December 07, 2019  8:38 AM. If you have any questions, ask your nurse or doctor.        amLODipine 5 MG tablet Commonly known as: NORVASC Take 5 mg by mouth 2  (two) times daily.   atorvastatin 40 MG tablet Commonly known as: LIPITOR Take 40 mg by mouth daily at 6 PM.   Carafate 1 GM/10ML suspension Generic drug: sucralfate Take 1 g by mouth 4 (four) times daily.   dicyclomine 10 MG capsule Commonly known as: BENTYL TAKE 1 CAPSULE (10 MG TOTAL) BY MOUTH 3 (THREE) TIMES DAILY BEFORE MEALS.   gabapentin 300 MG capsule Commonly known as: NEURONTIN TAKE 1 CAPSULE BY MOUTH THREE TIMES A DAY What changed:   how much to take  how to take this  when to take this  additional instructions   glimepiride 2 MG tablet Commonly known as: AMARYL Take 2 mg by mouth daily with breakfast.   lactulose 10 GM/15ML solution Commonly known as: CHRONULAC Take 30 mLs (20 g total) by mouth daily.   losartan 50 MG tablet Commonly known as: COZAAR Take 50 mg by mouth daily.   metFORMIN 500 MG tablet Commonly known as: GLUCOPHAGE Take 500 mg by mouth 2 (two) times daily.   metoprolol succinate 25 MG 24 hr tablet Commonly known as: TOPROL-XL Take 25 mg by mouth daily.   mirabegron ER 50 MG Tb24 tablet Commonly known as: MYRBETRIQ Take 50 mg by mouth daily.  morphine 30 MG 12 hr tablet Commonly known as: MS CONTIN Take 30 mg by mouth 2 (two) times daily.   omeprazole 40 MG capsule Commonly known as: PRILOSEC TAKE 1 CAPSULE BY MOUTH TWICE A DAY   ondansetron 8 MG tablet Commonly known as: ZOFRAN TAKE 1 TABLET(8 MG) BY MOUTH TWICE DAILY AS NEEDED FOR NAUSEA OR VOMITING   oxyCODONE-acetaminophen 10-325 MG tablet Commonly known as: PERCOCET Take 1 tablet by mouth every 6 (six) hours as needed for pain.   pioglitazone 30 MG tablet Commonly known as: ACTOS Take 30 mg by mouth daily.   prochlorperazine 10 MG tablet Commonly known as: COMPAZINE take 1 tablet by mouth every 6 hours if needed for nausea and vomiting   Restasis 0.05 % ophthalmic emulsion Generic drug: cycloSPORINE Place 1 drop into both eyes daily.   SUMAtriptan 100 MG  tablet Commonly known as: IMITREX Take 100 mg by mouth daily.   topiramate 25 MG tablet Commonly known as: TOPAMAX Take 25 mg by mouth daily.   torsemide 20 MG tablet Commonly known as: DEMADEX Take 40 mg by mouth daily.   Trulicity 8.52 DP/8.2UM Sopn Generic drug: Dulaglutide Inject 0.75 mg into the skin every Saturday.   Vitamin D (Ergocalciferol) 1.25 MG (50000 UNIT) Caps capsule Commonly known as: DRISDOL Take 50,000 Units by mouth every Saturday.       Allergies:  Allergies  Allergen Reactions  . Iodinated Diagnostic Agents Rash    Patient states he was instructed not to take IV contrast.  In 2008 he had an unknown reaction, and was told not to take it again.  He was also told not to take it due to his kidneys.  . Iodine Anxiety, Rash and Other (See Comments)    Didn't feel right "instructed not to take per MD--something with his port"     Past Medical History, Surgical history, Social history, and Family History were reviewed and updated.  Review of Systems: Review of Systems  Constitutional: Positive for malaise/fatigue.  HENT: Negative.   Eyes: Negative.   Respiratory: Negative.   Cardiovascular: Negative.   Gastrointestinal: Positive for heartburn.  Genitourinary: Negative.   Musculoskeletal: Positive for joint pain and myalgias.  Skin: Negative.   Neurological: Positive for tingling.  Endo/Heme/Allergies: Negative.   Psychiatric/Behavioral: Negative.      Physical Exam:  height is 5\' 11"  (1.803 m) and weight is 187 lb (84.8 kg). His oral temperature is 98.4 F (36.9 C). His blood pressure is 166/71 (abnormal) and his pulse is 89. His respiration is 18 and oxygen saturation is 97%.   Wt Readings from Last 3 Encounters:  12/07/19 187 lb (84.8 kg)  11/10/19 186 lb 1.9 oz (84.4 kg)  10/13/19 189 lb 14.4 oz (86.1 kg)    Physical Exam Vitals reviewed.  HENT:     Head: Normocephalic and atraumatic.  Eyes:     Pupils: Pupils are equal, round, and  reactive to light.  Cardiovascular:     Rate and Rhythm: Normal rate and regular rhythm.     Heart sounds: Normal heart sounds.  Pulmonary:     Effort: Pulmonary effort is normal.     Breath sounds: Normal breath sounds.  Abdominal:     General: Bowel sounds are normal.     Palpations: Abdomen is soft.  Musculoskeletal:        General: No tenderness or deformity. Normal range of motion.     Cervical back: Normal range of motion.  Lymphadenopathy:  Cervical: No cervical adenopathy.  Skin:    General: Skin is warm and dry.     Findings: No erythema or rash.  Neurological:     Mental Status: He is alert and oriented to person, place, and time.  Psychiatric:        Behavior: Behavior normal.        Thought Content: Thought content normal.        Judgment: Judgment normal.      Lab Results  Component Value Date   WBC 5.7 12/07/2019   HGB 10.4 (L) 12/07/2019   HCT 32.8 (L) 12/07/2019   MCV 82.0 12/07/2019   PLT 160 12/07/2019   Lab Results  Component Value Date   FERRITIN 2,343 (H) 11/10/2019   IRON 112 11/10/2019   TIBC 214 11/10/2019   UIBC 101 (L) 11/10/2019   IRONPCTSAT 53 11/10/2019   Lab Results  Component Value Date   RETICCTPCT 1.1 07/27/2019   RBC 4.00 (L) 12/07/2019   RETICCTABS 27.5 03/21/2014   Lab Results  Component Value Date   KPAFRELGTCHN 43.9 (H) 11/10/2019   LAMBDASER 20.4 11/10/2019   KAPLAMBRATIO 2.15 (H) 11/10/2019   Lab Results  Component Value Date   IGGSERUM 922 11/10/2019   IGA 183 11/10/2019   IGMSERUM 33 11/10/2019   Lab Results  Component Value Date   TOTALPROTELP 6.7 11/10/2019   ALBUMINELP 3.7 11/10/2019   A1GS 0.2 11/10/2019   A2GS 0.9 11/10/2019   BETS 0.9 11/10/2019   BETA2SER 0.5 11/07/2014   GAMS 0.9 11/10/2019   MSPIKE Not Observed 11/10/2019   SPEI Comment 11/10/2019     Chemistry      Component Value Date/Time   NA 141 11/10/2019 0817   NA 143 02/22/2017 0744   K 3.4 (L) 11/10/2019 0817   K 4.0  02/22/2017 0744   CL 103 11/10/2019 0817   CL 106 02/22/2017 0744   CO2 31 11/10/2019 0817   CO2 27 02/22/2017 0744   BUN 19 11/10/2019 0817   BUN 26 (H) 02/22/2017 0744   CREATININE 2.21 (H) 11/10/2019 0817   CREATININE 2.3 (H) 02/22/2017 0744      Component Value Date/Time   CALCIUM 9.4 11/10/2019 0817   CALCIUM 9.3 02/22/2017 0744   ALKPHOS 78 11/10/2019 0817   ALKPHOS 80 02/22/2017 0744   AST 19 11/10/2019 0817   ALT 15 11/10/2019 0817   ALT 22 02/22/2017 0744   BILITOT 0.4 11/10/2019 0817       Impression and Plan: Mr. Lotter is a very pleasant 69 yo African American gentleman with IgG kappa myeloma.  We will go ahead with chemotherapy today.  The Kyprolis/Cytoxan really has helped.  He is maintain a very low level of myeloma.  This should never bother him.  His main problem continues to be the arthritis and the diabetes.  He will get Aranesp today.   We will plan to get him back to see [place] in another month.  Volanda Napoleon, MD 10/14/20218:38 AM

## 2019-12-07 NOTE — Patient Instructions (Signed)
Carfilzomib injection What is this medicine? CARFILZOMIB (kar FILZ oh mib) targets a specific protein within cancer cells and stops the cancer cells from growing. It is used to treat multiple myeloma. This medicine may be used for other purposes; ask your health care provider or pharmacist if you have questions. COMMON BRAND NAME(S): KYPROLIS What should I tell my health care provider before I take this medicine? They need to know if you have any of these conditions:  heart disease  history of blood clots  irregular heartbeat  kidney disease  liver disease  lung or breathing disease  an unusual or allergic reaction to carfilzomib, or other medicines, foods, dyes, or preservatives  pregnant or trying to get pregnant  breast-feeding How should I use this medicine? This medicine is for injection or infusion into a vein. It is given by a health care professional in a hospital or clinic setting. Talk to your pediatrician regarding the use of this medicine in children. Special care may be needed. Overdosage: If you think you have taken too much of this medicine contact a poison control center or emergency room at once. NOTE: This medicine is only for you. Do not share this medicine with others. What if I miss a dose? It is important not to miss your dose. Call your doctor or health care professional if you are unable to keep an appointment. What may interact with this medicine? Interactions are not expected. Give your health care provider a list of all the medicines, herbs, non-prescription drugs, or dietary supplements you use. Also tell them if you smoke, drink alcohol, or use illegal drugs. Some items may interact with your medicine. This list may not describe all possible interactions. Give your health care provider a list of all the medicines, herbs, non-prescription drugs, or dietary supplements you use. Also tell them if you smoke, drink alcohol, or use illegal drugs. Some items  may interact with your medicine. What should I watch for while using this medicine? Your condition will be monitored carefully while you are receiving this medicine. Report any side effects. Continue your course of treatment even though you feel ill unless your doctor tells you to stop. You may need blood work done while you are taking this medicine. Do not become pregnant while taking this medicine or for at least 6 months after stopping it. Women should inform their doctor if they wish to become pregnant or think they might be pregnant. There is a potential for serious side effects to an unborn child. Men should not father a child while taking this medicine and for at least 3 months after stopping it. Talk to your health care professional or pharmacist for more information. Do not breast-feed an infant while taking this medicine or for 2 weeks after the last dose. Check with your doctor or health care professional if you get an attack of severe diarrhea, nausea and vomiting, or if you sweat a lot. The loss of too much body fluid can make it dangerous for you to take this medicine. You may get dizzy. Do not drive, use machinery, or do anything that needs mental alertness until you know how this medicine affects you. Do not stand or sit up quickly, especially if you are an older patient. This reduces the risk of dizzy or fainting spells. What side effects may I notice from receiving this medicine? Side effects that you should report to your doctor or health care professional as soon as possible:  allergic reactions like skin  rash, itching or hives, swelling of the face, lips, or tongue  confusion  dizziness  feeling faint or lightheaded  fever or chills  palpitations  seizures  signs and symptoms of bleeding such as bloody or black, tarry stools; red or dark-brown urine; spitting up blood or brown material that looks like coffee grounds; red spots on the skin; unusual bruising or bleeding  including from the eye, gums, or nose  signs and symptoms of a blood clot such as breathing problems; changes in vision; chest pain; severe, sudden headache; pain, swelling, warmth in the leg; trouble speaking; sudden numbness or weakness of the face, arm or leg  signs and symptoms of kidney injury like trouble passing urine or change in the amount of urine  signs and symptoms of liver injury like dark yellow or brown urine; general ill feeling or flu-like symptoms; light-colored stools; loss of appetite; nausea; right upper belly pain; unusually weak or tired; yellowing of the eyes or skin Side effects that usually do not require medical attention (report to your doctor or health care professional if they continue or are bothersome):  back pain  cough  diarrhea  headache  muscle cramps  trouble sleeping  vomiting This list may not describe all possible side effects. Call your doctor for medical advice about side effects. You may report side effects to FDA at 1-800-FDA-1088. Where should I keep my medicine? This drug is given in a hospital or clinic and will not be stored at home. NOTE: This sheet is a summary. It may not cover all possible information. If you have questions about this medicine, talk to your doctor, pharmacist, or health care provider.  2020 Elsevier/Gold Standard (2018-10-17 19:44:21) Epoetin Alfa injection What is this medicine? EPOETIN ALFA (e POE e tin AL fa) helps your body make more red blood cells. This medicine is used to treat anemia caused by chronic kidney disease, cancer chemotherapy, or HIV-therapy. It may also be used before surgery if you have anemia. This medicine may be used for other purposes; ask your health care provider or pharmacist if you have questions. COMMON BRAND NAME(S): Epogen, Procrit, Retacrit What should I tell my health care provider before I take this medicine? They need to know if you have any of these  conditions:  cancer  heart disease  high blood pressure  history of blood clots  history of stroke  low levels of folate, iron, or vitamin B12 in the blood  seizures  an unusual or allergic reaction to erythropoietin, albumin, benzyl alcohol, hamster proteins, other medicines, foods, dyes, or preservatives  pregnant or trying to get pregnant  breast-feeding How should I use this medicine? This medicine is for injection into a vein or under the skin. It is usually given by a health care professional in a hospital or clinic setting. If you get this medicine at home, you will be taught how to prepare and give this medicine. Use exactly as directed. Take your medicine at regular intervals. Do not take your medicine more often than directed. It is important that you put your used needles and syringes in a special sharps container. Do not put them in a trash can. If you do not have a sharps container, call your pharmacist or healthcare provider to get one. A special MedGuide will be given to you by the pharmacist with each prescription and refill. Be sure to read this information carefully each time. Talk to your pediatrician regarding the use of this medicine in children.  While this drug may be prescribed for selected conditions, precautions do apply. Overdosage: If you think you have taken too much of this medicine contact a poison control center or emergency room at once. NOTE: This medicine is only for you. Do not share this medicine with others. What if I miss a dose? If you miss a dose, take it as soon as you can. If it is almost time for your next dose, take only that dose. Do not take double or extra doses. What may interact with this medicine? Interactions have not been studied. This list may not describe all possible interactions. Give your health care provider a list of all the medicines, herbs, non-prescription drugs, or dietary supplements you use. Also tell them if you smoke,  drink alcohol, or use illegal drugs. Some items may interact with your medicine. What should I watch for while using this medicine? Your condition will be monitored carefully while you are receiving this medicine. You may need blood work done while you are taking this medicine. This medicine may cause a decrease in vitamin B6. You should make sure that you get enough vitamin B6 while you are taking this medicine. Discuss the foods you eat and the vitamins you take with your health care professional. What side effects may I notice from receiving this medicine? Side effects that you should report to your doctor or health care professional as soon as possible:  allergic reactions like skin rash, itching or hives, swelling of the face, lips, or tongue  seizures  signs and symptoms of a blood clot such as breathing problems; changes in vision; chest pain; severe, sudden headache; pain, swelling, warmth in the leg; trouble speaking; sudden numbness or weakness of the face, arm or leg  signs and symptoms of a stroke like changes in vision; confusion; trouble speaking or understanding; severe headaches; sudden numbness or weakness of the face, arm or leg; trouble walking; dizziness; loss of balance or coordination Side effects that usually do not require medical attention (report to your doctor or health care professional if they continue or are bothersome):  chills  cough  dizziness  fever  headaches  joint pain  muscle cramps  muscle pain  nausea, vomiting  pain, redness, or irritation at site where injected This list may not describe all possible side effects. Call your doctor for medical advice about side effects. You may report side effects to FDA at 1-800-FDA-1088. Where should I keep my medicine? Keep out of the reach of children. Store in a refrigerator between 2 and 8 degrees C (36 and 46 degrees F). Do not freeze or shake. Throw away any unused portion if using a single-dose  vial. Multi-dose vials can be kept in the refrigerator for up to 21 days after the initial dose. Throw away unused medicine. NOTE: This sheet is a summary. It may not cover all possible information. If you have questions about this medicine, talk to your doctor, pharmacist, or health care provider.  2020 Elsevier/Gold Standard (2016-09-18 08:35:19)

## 2019-12-08 LAB — KAPPA/LAMBDA LIGHT CHAINS
Kappa free light chain: 45.1 mg/L — ABNORMAL HIGH (ref 3.3–19.4)
Kappa, lambda light chain ratio: 2.3 — ABNORMAL HIGH (ref 0.26–1.65)
Lambda free light chains: 19.6 mg/L (ref 5.7–26.3)

## 2019-12-08 LAB — IGG, IGA, IGM
IgA: 225 mg/dL (ref 61–437)
IgG (Immunoglobin G), Serum: 1081 mg/dL (ref 603–1613)
IgM (Immunoglobulin M), Srm: 36 mg/dL (ref 20–172)

## 2019-12-08 LAB — TESTOSTERONE: Testosterone: 214 ng/dL — ABNORMAL LOW (ref 264–916)

## 2019-12-11 LAB — PROTEIN ELECTROPHORESIS, SERUM
A/G Ratio: 1.1 (ref 0.7–1.7)
Albumin ELP: 3.7 g/dL (ref 2.9–4.4)
Alpha-1-Globulin: 0.2 g/dL (ref 0.0–0.4)
Alpha-2-Globulin: 0.9 g/dL (ref 0.4–1.0)
Beta Globulin: 1 g/dL (ref 0.7–1.3)
Gamma Globulin: 1.1 g/dL (ref 0.4–1.8)
Globulin, Total: 3.3 g/dL (ref 2.2–3.9)
Total Protein ELP: 7 g/dL (ref 6.0–8.5)

## 2020-01-01 ENCOUNTER — Other Ambulatory Visit: Payer: Self-pay

## 2020-01-01 ENCOUNTER — Encounter: Payer: Self-pay | Admitting: Family

## 2020-01-01 ENCOUNTER — Inpatient Hospital Stay: Payer: 59 | Attending: Hematology & Oncology | Admitting: Family

## 2020-01-01 ENCOUNTER — Other Ambulatory Visit: Payer: Self-pay | Admitting: Family

## 2020-01-01 ENCOUNTER — Inpatient Hospital Stay: Payer: 59

## 2020-01-01 VITALS — BP 149/61 | HR 94 | Temp 98.6°F | Resp 17 | Ht 71.0 in | Wt 188.0 lb

## 2020-01-01 DIAGNOSIS — E349 Endocrine disorder, unspecified: Secondary | ICD-10-CM

## 2020-01-01 DIAGNOSIS — Z5111 Encounter for antineoplastic chemotherapy: Secondary | ICD-10-CM | POA: Diagnosis present

## 2020-01-01 DIAGNOSIS — C9000 Multiple myeloma not having achieved remission: Secondary | ICD-10-CM

## 2020-01-01 DIAGNOSIS — N189 Chronic kidney disease, unspecified: Secondary | ICD-10-CM

## 2020-01-01 DIAGNOSIS — D631 Anemia in chronic kidney disease: Secondary | ICD-10-CM | POA: Insufficient documentation

## 2020-01-01 DIAGNOSIS — G629 Polyneuropathy, unspecified: Secondary | ICD-10-CM | POA: Insufficient documentation

## 2020-01-01 DIAGNOSIS — Z5112 Encounter for antineoplastic immunotherapy: Secondary | ICD-10-CM | POA: Insufficient documentation

## 2020-01-01 DIAGNOSIS — D500 Iron deficiency anemia secondary to blood loss (chronic): Secondary | ICD-10-CM

## 2020-01-01 DIAGNOSIS — M4712 Other spondylosis with myelopathy, cervical region: Secondary | ICD-10-CM

## 2020-01-01 DIAGNOSIS — Z299 Encounter for prophylactic measures, unspecified: Secondary | ICD-10-CM

## 2020-01-01 DIAGNOSIS — E291 Testicular hypofunction: Secondary | ICD-10-CM | POA: Insufficient documentation

## 2020-01-01 LAB — CMP (CANCER CENTER ONLY)
ALT: 16 U/L (ref 0–44)
AST: 23 U/L (ref 15–41)
Albumin: 4.3 g/dL (ref 3.5–5.0)
Alkaline Phosphatase: 76 U/L (ref 38–126)
Anion gap: 9 (ref 5–15)
BUN: 27 mg/dL — ABNORMAL HIGH (ref 8–23)
CO2: 33 mmol/L — ABNORMAL HIGH (ref 22–32)
Calcium: 9.7 mg/dL (ref 8.9–10.3)
Chloride: 100 mmol/L (ref 98–111)
Creatinine: 2.43 mg/dL — ABNORMAL HIGH (ref 0.61–1.24)
GFR, Estimated: 28 mL/min — ABNORMAL LOW (ref >60)
Glucose, Bld: 128 mg/dL — ABNORMAL HIGH (ref 70–99)
Potassium: 3.8 mmol/L (ref 3.5–5.1)
Sodium: 142 mmol/L (ref 135–145)
Total Bilirubin: 0.4 mg/dL (ref 0.3–1.2)
Total Protein: 7.1 g/dL (ref 6.5–8.1)

## 2020-01-01 LAB — RETICULOCYTES
Immature Retic Fract: 9.6 % (ref 2.3–15.9)
RBC.: 4.01 MIL/uL — ABNORMAL LOW (ref 4.22–5.81)
Retic Count, Absolute: 36.5 10*3/uL (ref 19.0–186.0)
Retic Ct Pct: 0.9 % (ref 0.4–3.1)

## 2020-01-01 LAB — CBC WITH DIFFERENTIAL (CANCER CENTER ONLY)
Abs Immature Granulocytes: 0.02 10*3/uL (ref 0.00–0.07)
Basophils Absolute: 0 10*3/uL (ref 0.0–0.1)
Basophils Relative: 1 %
Eosinophils Absolute: 0.1 10*3/uL (ref 0.0–0.5)
Eosinophils Relative: 2 %
HCT: 32.9 % — ABNORMAL LOW (ref 39.0–52.0)
Hemoglobin: 10.5 g/dL — ABNORMAL LOW (ref 13.0–17.0)
Immature Granulocytes: 0 %
Lymphocytes Relative: 18 %
Lymphs Abs: 0.9 10*3/uL (ref 0.7–4.0)
MCH: 26.1 pg (ref 26.0–34.0)
MCHC: 31.9 g/dL (ref 30.0–36.0)
MCV: 81.6 fL (ref 80.0–100.0)
Monocytes Absolute: 0.6 10*3/uL (ref 0.1–1.0)
Monocytes Relative: 12 %
Neutro Abs: 3.3 10*3/uL (ref 1.7–7.7)
Neutrophils Relative %: 67 %
Platelet Count: 185 10*3/uL (ref 150–400)
RBC: 4.03 MIL/uL — ABNORMAL LOW (ref 4.22–5.81)
RDW: 17.7 % — ABNORMAL HIGH (ref 11.5–15.5)
WBC Count: 4.8 10*3/uL (ref 4.0–10.5)
nRBC: 0 % (ref 0.0–0.2)

## 2020-01-01 LAB — IRON AND TIBC
Iron: 100 ug/dL (ref 42–163)
Saturation Ratios: 40 % (ref 20–55)
TIBC: 253 ug/dL (ref 202–409)
UIBC: 152 ug/dL (ref 117–376)

## 2020-01-01 LAB — LACTATE DEHYDROGENASE: LDH: 225 U/L — ABNORMAL HIGH (ref 98–192)

## 2020-01-01 LAB — FERRITIN: Ferritin: 2006 ng/mL — ABNORMAL HIGH (ref 24–336)

## 2020-01-01 MED ORDER — SODIUM CHLORIDE 0.9% FLUSH
10.0000 mL | INTRAVENOUS | Status: DC | PRN
  Administered 2020-01-01: 10 mL
  Filled 2020-01-01: qty 10

## 2020-01-01 MED ORDER — SODIUM CHLORIDE 0.9 % IV SOLN
Freq: Once | INTRAVENOUS | Status: DC
  Filled 2020-01-01: qty 250

## 2020-01-01 MED ORDER — ONDANSETRON HCL 8 MG PO TABS: ORAL_TABLET | ORAL | 3 refills | Status: DC

## 2020-01-01 MED ORDER — PALONOSETRON HCL INJECTION 0.25 MG/5ML
0.2500 mg | Freq: Once | INTRAVENOUS | Status: AC
  Administered 2020-01-01: 0.25 mg via INTRAVENOUS

## 2020-01-01 MED ORDER — SODIUM CHLORIDE 0.9 % IV SOLN
10.0000 mg | Freq: Once | INTRAVENOUS | Status: AC
  Administered 2020-01-01: 10 mg via INTRAVENOUS
  Filled 2020-01-01: qty 10

## 2020-01-01 MED ORDER — HYDROMORPHONE HCL 4 MG/ML IJ SOLN
INTRAMUSCULAR | Status: AC
  Filled 2020-01-01: qty 1

## 2020-01-01 MED ORDER — EPOETIN ALFA-EPBX 40000 UNIT/ML IJ SOLN: 40000.0000 [IU] | Freq: Once | INTRAMUSCULAR | Status: DC

## 2020-01-01 MED ORDER — TESTOSTERONE CYPIONATE 200 MG/ML IM SOLN
400.0000 mg | Freq: Once | INTRAMUSCULAR | Status: AC
  Administered 2020-01-01: 400 mg via INTRAMUSCULAR

## 2020-01-01 MED ORDER — PROCHLORPERAZINE MALEATE 10 MG PO TABS: ORAL_TABLET | ORAL | 1 refills | Status: DC

## 2020-01-01 MED ORDER — SODIUM CHLORIDE 0.9 % IV SOLN
60.0000 mg | Freq: Once | INTRAVENOUS | Status: AC
  Administered 2020-01-01: 60 mg via INTRAVENOUS
  Filled 2020-01-01: qty 20
  Filled 2020-01-01: qty 10

## 2020-01-01 MED ORDER — HEPARIN SOD (PORK) LOCK FLUSH 100 UNIT/ML IV SOLN
500.0000 [IU] | Freq: Once | INTRAVENOUS | Status: AC | PRN
  Administered 2020-01-01: 500 [IU]
  Filled 2020-01-01: qty 5

## 2020-01-01 MED ORDER — DEXTROSE 5 % IV SOLN
29.0000 mg/m2 | Freq: Once | INTRAVENOUS | Status: AC
  Administered 2020-01-01: 60 mg via INTRAVENOUS
  Filled 2020-01-01: qty 30

## 2020-01-01 MED ORDER — SODIUM CHLORIDE 0.9 % IV SOLN
300.0000 mg/m2 | Freq: Once | INTRAVENOUS | Status: AC
  Administered 2020-01-01: 620 mg via INTRAVENOUS
  Filled 2020-01-01: qty 31

## 2020-01-01 MED ORDER — PALONOSETRON HCL INJECTION 0.25 MG/5ML
INTRAVENOUS | Status: AC
  Filled 2020-01-01: qty 5

## 2020-01-01 MED ORDER — TESTOSTERONE CYPIONATE 200 MG/ML IM SOLN
INTRAMUSCULAR | Status: AC
  Filled 2020-01-01: qty 2

## 2020-01-01 MED ORDER — HYDROMORPHONE HCL 1 MG/ML IJ SOLN
2.0000 mg | Freq: Once | INTRAMUSCULAR | Status: AC
  Administered 2020-01-01: 2 mg via INTRAVENOUS

## 2020-01-01 MED ORDER — SODIUM CHLORIDE 0.9 % IV SOLN
Freq: Once | INTRAVENOUS | Status: AC
  Filled 2020-01-01: qty 250

## 2020-01-01 NOTE — Progress Notes (Signed)
Reviewed pt labs with Laverna Peace, NP and pt ok to treat with creatinine 2.43

## 2020-01-01 NOTE — Progress Notes (Signed)
Hematology and Oncology Follow Up Visit  [name] [id number] April 29, 1950 69 y.o. 01/01/2020   Principle Diagnosis:  IgG kappa myeloma  Anemia secondary to renal insufficiency  Intermittent iron - deficiency anemia  Hypotestosteronemia  Current Therapy: Aredia 60 mg IV q 3 months-- next dose11/2021 Retacrit 40,000 units SQ as needed for hemoglobin less than 11 DepoTestosterone400 mg q4weeks IV iron as indicated Kyprolis/Cytoxan - s/p cycle26   Interim History:  [name] is here today for follow-up and treatment. He is having a hard time with arthritis aches and pains. He states that he goes later this week on Thursday for steroid injection in his lower back. He states that these do help.  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  M-spike in October not observed. IgG level was 1,081 mg/dL and kappa light chains 4.51 mg/dL.  Testosterone last month was 214.  No fever, chills, cough, dizziness, chest pain, palpitations, abdominal pain or changes in bowel or bladder habits.  He notes a itchy rash across his cheeks. No visible rash noted on exam. He will try over the counter hydrocortisone cream BID as needed.  He has mild SOB with over exertion and takes and break to rest when needed.  He has headaches and has started taking Imitrex which does help.  He has GERD and does have occasional episodes of n/v with certain foods and smells.  He states that he is eating well and doing his best to stay well hydrated. His weight is stable at 188 lbs.   ECOG Performance Status: 1 - Symptomatic but completely ambulatory  Medications:  Allergies as of 01/01/2020      Reactions   Iodinated Diagnostic Agents Rash   Patient states he was instructed not to take IV contrast.  In 2008 he had an unknown reaction, and was told not to take it again.  He was also told not to take it due to his kidneys.   Iodine Anxiety, Rash, Other (See  Comments)   Didn't feel right "instructed not to take per MD--something with his port"      Medication List       Accurate as of January 01, 2020  8:48 AM. If you have any questions, ask your nurse or doctor.        amLODipine 5 MG tablet Commonly known as: NORVASC Take 5 mg by mouth 2 (two) times daily.   atorvastatin 40 MG tablet Commonly known as: LIPITOR Take 40 mg by mouth daily at 6 PM.   Carafate 1 GM/10ML suspension Generic drug: sucralfate Take 1 g by mouth 4 (four) times daily.   dicyclomine 10 MG capsule Commonly known as: BENTYL TAKE 1 CAPSULE (10 MG TOTAL) BY MOUTH 3 (THREE) TIMES DAILY BEFORE MEALS.   gabapentin 300 MG capsule Commonly known as: NEURONTIN TAKE 1 CAPSULE BY MOUTH THREE TIMES A DAY What changed:   how much to take  how to take this  when to take this  additional instructions   glimepiride 2 MG tablet Commonly known as: AMARYL Take 2 mg by mouth daily with breakfast.   lactulose 10 GM/15ML solution Commonly known as: CHRONULAC Take 30 mLs (20 g total) by mouth daily.   losartan 50 MG tablet Commonly known as: COZAAR Take 50 mg by mouth daily.   metFORMIN 500 MG tablet Commonly known as: GLUCOPHAGE Take 500 mg by mouth 2 (two) times daily.   metoprolol succinate 25 MG 24 hr tablet  Commonly known as: TOPROL-XL Take 25 mg by mouth daily.   mirabegron ER 50 MG Tb24 tablet Commonly known as: MYRBETRIQ Take 50 mg by mouth daily.   morphine 30 MG 12 hr tablet Commonly known as: MS CONTIN Take 30 mg by mouth 2 (two) times daily.   omeprazole 40 MG capsule Commonly known as: PRILOSEC TAKE 1 CAPSULE BY MOUTH TWICE A DAY   ondansetron 8 MG tablet Commonly known as: ZOFRAN TAKE 1 TABLET(8 MG) BY MOUTH TWICE DAILY AS NEEDED FOR NAUSEA OR VOMITING   oxyCODONE-acetaminophen 10-325 MG tablet Commonly known as: PERCOCET Take 1 tablet by mouth every 6 (six) hours as needed for pain.   pioglitazone 30 MG tablet Commonly known  as: ACTOS Take 30 mg by mouth daily.   prochlorperazine 10 MG tablet Commonly known as: COMPAZINE take 1 tablet by mouth every 6 hours if needed for nausea and vomiting   Restasis 0.05 % ophthalmic emulsion Generic drug: cycloSPORINE Place 1 drop into both eyes daily.   SUMAtriptan 100 MG tablet Commonly known as: IMITREX Take 100 mg by mouth daily.   topiramate 25 MG tablet Commonly known as: TOPAMAX Take 25 mg by mouth daily.   torsemide 20 MG tablet Commonly known as: DEMADEX Take 40 mg by mouth daily.   Trulicity 7.62 GB/1.5VV Sopn Generic drug: Dulaglutide Inject 0.75 mg into the skin every Saturday.   Vitamin D (Ergocalciferol) 1.25 MG (50000 UNIT) Caps capsule Commonly known as: DRISDOL Take 50,000 Units by mouth every Saturday.       Allergies:  Allergies  Allergen Reactions  . Iodinated Diagnostic Agents Rash    Patient states he was instructed not to take IV contrast.  In 2008 he had an unknown reaction, and was told not to take it again.  He was also told not to take it due to his kidneys.  . Iodine Anxiety, Rash and Other (See Comments)    Didn't feel right "instructed not to take per MD--something with his port"     Past Medical History, Surgical history, Social history, and Family History were reviewed and updated.  Review of Systems: All other 10 point review of systems is negative.   Physical Exam:  height is 5\' 11"  (1.803 m) and weight is 188 lb 0.6 oz (85.3 kg). His oral temperature is 98.6 F (37 C). His blood pressure is 149/61 (abnormal) and his pulse is 94. His respiration is 17 and oxygen saturation is 99%.   Wt Readings from Last 3 Encounters:  01/01/20 188 lb 0.6 oz (85.3 kg)  12/07/19 187 lb (84.8 kg)  11/10/19 186 lb 1.9 oz (84.4 kg)    Ocular: Sclerae unicteric, pupils equal, round and reactive to light Ear-nose-throat: Oropharynx clear, dentition fair Lymphatic: No cervical or supraclavicular adenopathy Lungs no rales or  rhonchi, good excursion bilaterally Heart regular rate and rhythm, no murmur appreciated Abd soft, nontender, positive bowel sounds, no liver or spleen tip palpated on exam, no fluid wave  MSK no focal spinal tenderness, no joint edema Neuro: non-focal, well-oriented, appropriate affect Breasts: Deferred   Lab Results  Component Value Date   WBC 4.8 01/01/2020   HGB 10.5 (L) 01/01/2020   HCT 32.9 (L) 01/01/2020   MCV 81.6 01/01/2020   PLT 185 01/01/2020   Lab Results  Component Value Date   FERRITIN 2,428 (H) 12/07/2019   IRON 114 12/07/2019   TIBC 268 12/07/2019   UIBC 154 12/07/2019   IRONPCTSAT 43 12/07/2019   Lab Results  Component Value Date   RETICCTPCT 0.9 01/01/2020   RBC 4.03 (L) 01/01/2020   RBC 4.01 (L) 01/01/2020   RETICCTABS 27.5 03/21/2014   Lab Results  Component Value Date   KPAFRELGTCHN 45.1 (H) 12/07/2019   LAMBDASER 19.6 12/07/2019   KAPLAMBRATIO 2.30 (H) 12/07/2019   Lab Results  Component Value Date   IGGSERUM 1,081 12/07/2019   IGA 225 12/07/2019   IGMSERUM 36 12/07/2019   Lab Results  Component Value Date   TOTALPROTELP 7.0 12/07/2019   ALBUMINELP 3.7 12/07/2019   A1GS 0.2 12/07/2019   A2GS 0.9 12/07/2019   BETS 1.0 12/07/2019   BETA2SER 0.5 11/07/2014   GAMS 1.1 12/07/2019   MSPIKE Not Observed 12/07/2019   SPEI Comment 12/07/2019     Chemistry      Component Value Date/Time   NA 141 12/07/2019 0815   NA 143 02/22/2017 0744   K 3.7 12/07/2019 0815   K 4.0 02/22/2017 0744   CL 102 12/07/2019 0815   CL 106 02/22/2017 0744   CO2 29 12/07/2019 0815   CO2 27 02/22/2017 0744   BUN 39 (H) 12/07/2019 0815   BUN 26 (H) 02/22/2017 0744   CREATININE 2.33 (H) 12/07/2019 0815   CREATININE 2.3 (H) 02/22/2017 0744      Component Value Date/Time   CALCIUM 9.4 12/07/2019 0815   CALCIUM 9.3 02/22/2017 0744   ALKPHOS 72 12/07/2019 0815   ALKPHOS 80 02/22/2017 0744   AST 26 12/07/2019 0815   ALT 38 12/07/2019 0815   ALT 22 02/22/2017  0744   BILITOT 0.3 12/07/2019 0815       Impression and Plan: Mr. Gehl is a very pleasant 69 yo African American gentleman with IgG kappa myeloma. He continues to have a nice response to chemotherapy. His protein studies have remained stable.  We will proceed with treatment today as well including Aredia.  He will also receive his testosterone and Retacrit.  Iron studies pending.  Follow-up in 1 month.  He was encouraged to contact our office with any questions or concerns.   Laverna Peace, NP 11/8/20218:48 AM

## 2020-01-01 NOTE — Patient Instructions (Addendum)
[facility] Discharge Instructions for Patients Receiving Chemotherapy  Today you received the following chemotherapy agents Kyprolis, Cytoxan and Aredia  To help prevent nausea and vomiting after your treatment, we encourage you to take your nausea medication as prescribed by MD.   If you develop nausea and vomiting that is not controlled by your nausea medication, call the clinic.   BELOW ARE SYMPTOMS THAT SHOULD BE REPORTED IMMEDIATELY:  *FEVER GREATER THAN 100.5 F  *CHILLS WITH OR WITHOUT FEVER  NAUSEA AND VOMITING THAT IS NOT CONTROLLED WITH YOUR NAUSEA MEDICATION  *UNUSUAL SHORTNESS OF BREATH  *UNUSUAL BRUISING OR BLEEDING  TENDERNESS IN MOUTH AND THROAT WITH OR WITHOUT PRESENCE OF ULCERS  *URINARY PROBLEMS  *BOWEL PROBLEMS  UNUSUAL RASH Items with * indicate a potential emergency and should be followed up as soon as possible.  Feel free to call the clinic should you have any questions or concerns. The clinic phone number is [phone].  Please show the [place] at check-in to the Emergency Department and triage nurse.  Pt discharged in no apparent distress. Pt left ambulatory without assistance. Pt aware of discharge instructions and verbalized understanding and had no further questions.

## 2020-01-01 NOTE — Patient Instructions (Signed)

## 2020-01-02 ENCOUNTER — Telehealth: Payer: Self-pay

## 2020-01-02 LAB — PROTEIN ELECTROPHORESIS, SERUM, WITH REFLEX
A/G Ratio: 1.1 (ref 0.7–1.7)
Albumin ELP: 3.5 g/dL (ref 2.9–4.4)
Alpha-1-Globulin: 0.2 g/dL (ref 0.0–0.4)
Alpha-2-Globulin: 1 g/dL (ref 0.4–1.0)
Beta Globulin: 1 g/dL (ref 0.7–1.3)
Gamma Globulin: 1 g/dL (ref 0.4–1.8)
Globulin, Total: 3.3 g/dL (ref 2.2–3.9)
Total Protein ELP: 6.8 g/dL (ref 6.0–8.5)

## 2020-01-02 LAB — KAPPA/LAMBDA LIGHT CHAINS
Kappa free light chain: 49.9 mg/L — ABNORMAL HIGH (ref 3.3–19.4)
Kappa, lambda light chain ratio: 2.11 — ABNORMAL HIGH (ref 0.26–1.65)
Lambda free light chains: 23.7 mg/L (ref 5.7–26.3)

## 2020-01-02 LAB — TESTOSTERONE: Testosterone: 541 ng/dL (ref 264–916)

## 2020-01-02 LAB — IGG, IGA, IGM
IgA: 218 mg/dL (ref 61–437)
IgG (Immunoglobin G), Serum: 1054 mg/dL (ref 603–1613)
IgM (Immunoglobulin M), Srm: 36 mg/dL (ref 20–172)

## 2020-01-02 NOTE — Telephone Encounter (Signed)
Called pt at [phone] with r/s appt from 01/29/20 to 02/02/20.  Pt unable to come 02/02/20, moved to  02/06/20... AOM

## 2020-01-29 ENCOUNTER — Inpatient Hospital Stay (HOSPITAL_BASED_OUTPATIENT_CLINIC_OR_DEPARTMENT_OTHER): Payer: 59 | Admitting: Hematology & Oncology

## 2020-01-29 ENCOUNTER — Inpatient Hospital Stay: Payer: 59

## 2020-01-29 ENCOUNTER — Encounter: Payer: Self-pay | Admitting: Hematology & Oncology

## 2020-01-29 ENCOUNTER — Other Ambulatory Visit: Payer: 59

## 2020-01-29 ENCOUNTER — Ambulatory Visit: Payer: 59

## 2020-01-29 ENCOUNTER — Inpatient Hospital Stay: Payer: 59 | Attending: Hematology & Oncology

## 2020-01-29 ENCOUNTER — Ambulatory Visit: Payer: 59 | Admitting: Family

## 2020-01-29 ENCOUNTER — Other Ambulatory Visit: Payer: Self-pay

## 2020-01-29 VITALS — BP 159/79 | HR 93 | Temp 98.1°F | Resp 18 | Wt 192.0 lb

## 2020-01-29 DIAGNOSIS — C9000 Multiple myeloma not having achieved remission: Secondary | ICD-10-CM | POA: Insufficient documentation

## 2020-01-29 DIAGNOSIS — E349 Endocrine disorder, unspecified: Secondary | ICD-10-CM

## 2020-01-29 DIAGNOSIS — Z5112 Encounter for antineoplastic immunotherapy: Secondary | ICD-10-CM | POA: Insufficient documentation

## 2020-01-29 DIAGNOSIS — N189 Chronic kidney disease, unspecified: Secondary | ICD-10-CM | POA: Insufficient documentation

## 2020-01-29 DIAGNOSIS — D631 Anemia in chronic kidney disease: Secondary | ICD-10-CM | POA: Insufficient documentation

## 2020-01-29 DIAGNOSIS — Z299 Encounter for prophylactic measures, unspecified: Secondary | ICD-10-CM

## 2020-01-29 DIAGNOSIS — Z5111 Encounter for antineoplastic chemotherapy: Secondary | ICD-10-CM | POA: Insufficient documentation

## 2020-01-29 DIAGNOSIS — E291 Testicular hypofunction: Secondary | ICD-10-CM | POA: Insufficient documentation

## 2020-01-29 DIAGNOSIS — D500 Iron deficiency anemia secondary to blood loss (chronic): Secondary | ICD-10-CM | POA: Diagnosis not present

## 2020-01-29 LAB — CBC WITH DIFFERENTIAL (CANCER CENTER ONLY)
Abs Immature Granulocytes: 0.06 10*3/uL (ref 0.00–0.07)
Basophils Absolute: 0 10*3/uL (ref 0.0–0.1)
Basophils Relative: 1 %
Eosinophils Absolute: 0.2 10*3/uL (ref 0.0–0.5)
Eosinophils Relative: 4 %
HCT: 32.4 % — ABNORMAL LOW (ref 39.0–52.0)
Hemoglobin: 10.1 g/dL — ABNORMAL LOW (ref 13.0–17.0)
Immature Granulocytes: 1 %
Lymphocytes Relative: 18 %
Lymphs Abs: 0.8 10*3/uL (ref 0.7–4.0)
MCH: 25.6 pg — ABNORMAL LOW (ref 26.0–34.0)
MCHC: 31.2 g/dL (ref 30.0–36.0)
MCV: 82.2 fL (ref 80.0–100.0)
Monocytes Absolute: 0.6 10*3/uL (ref 0.1–1.0)
Monocytes Relative: 13 %
Neutro Abs: 3 10*3/uL (ref 1.7–7.7)
Neutrophils Relative %: 63 %
Platelet Count: 179 10*3/uL (ref 150–400)
RBC: 3.94 MIL/uL — ABNORMAL LOW (ref 4.22–5.81)
RDW: 16 % — ABNORMAL HIGH (ref 11.5–15.5)
WBC Count: 4.8 10*3/uL (ref 4.0–10.5)
nRBC: 0 % (ref 0.0–0.2)

## 2020-01-29 LAB — RETICULOCYTES
Immature Retic Fract: 12.2 % (ref 2.3–15.9)
RBC.: 3.89 MIL/uL — ABNORMAL LOW (ref 4.22–5.81)
Retic Count, Absolute: 37 10*3/uL (ref 19.0–186.0)
Retic Ct Pct: 1 % (ref 0.4–3.1)

## 2020-01-29 LAB — CMP (CANCER CENTER ONLY)
ALT: 15 U/L (ref 0–44)
AST: 22 U/L (ref 15–41)
Albumin: 4.4 g/dL (ref 3.5–5.0)
Alkaline Phosphatase: 93 U/L (ref 38–126)
Anion gap: 8 (ref 5–15)
BUN: 29 mg/dL — ABNORMAL HIGH (ref 8–23)
CO2: 33 mmol/L — ABNORMAL HIGH (ref 22–32)
Calcium: 9.6 mg/dL (ref 8.9–10.3)
Chloride: 100 mmol/L (ref 98–111)
Creatinine: 2.41 mg/dL — ABNORMAL HIGH (ref 0.61–1.24)
GFR, Estimated: 28 mL/min — ABNORMAL LOW (ref >60)
Glucose, Bld: 111 mg/dL — ABNORMAL HIGH (ref 70–99)
Potassium: 3.7 mmol/L (ref 3.5–5.1)
Sodium: 141 mmol/L (ref 135–145)
Total Bilirubin: 0.4 mg/dL (ref 0.3–1.2)
Total Protein: 7.4 g/dL (ref 6.5–8.1)

## 2020-01-29 LAB — IRON AND TIBC
Iron: 80 ug/dL (ref 42–163)
Saturation Ratios: 33 % (ref 20–55)
TIBC: 244 ug/dL (ref 202–409)
UIBC: 164 ug/dL (ref 117–376)

## 2020-01-29 LAB — LACTATE DEHYDROGENASE: LDH: 226 U/L — ABNORMAL HIGH (ref 98–192)

## 2020-01-29 LAB — FERRITIN: Ferritin: 2385 ng/mL — ABNORMAL HIGH (ref 24–336)

## 2020-01-29 IMAGING — CR DG HIP (WITH OR WITHOUT PELVIS) 2-3V LEFT
3 series · 3 of 3 positions shown · non-contrast
Comparison: None.

CLINICAL DATA: Pain and swelling, left hip

EXAM:
DG HIP (WITH OR WITHOUT PELVIS) 2-3V LEFT

[t pelvis ap]
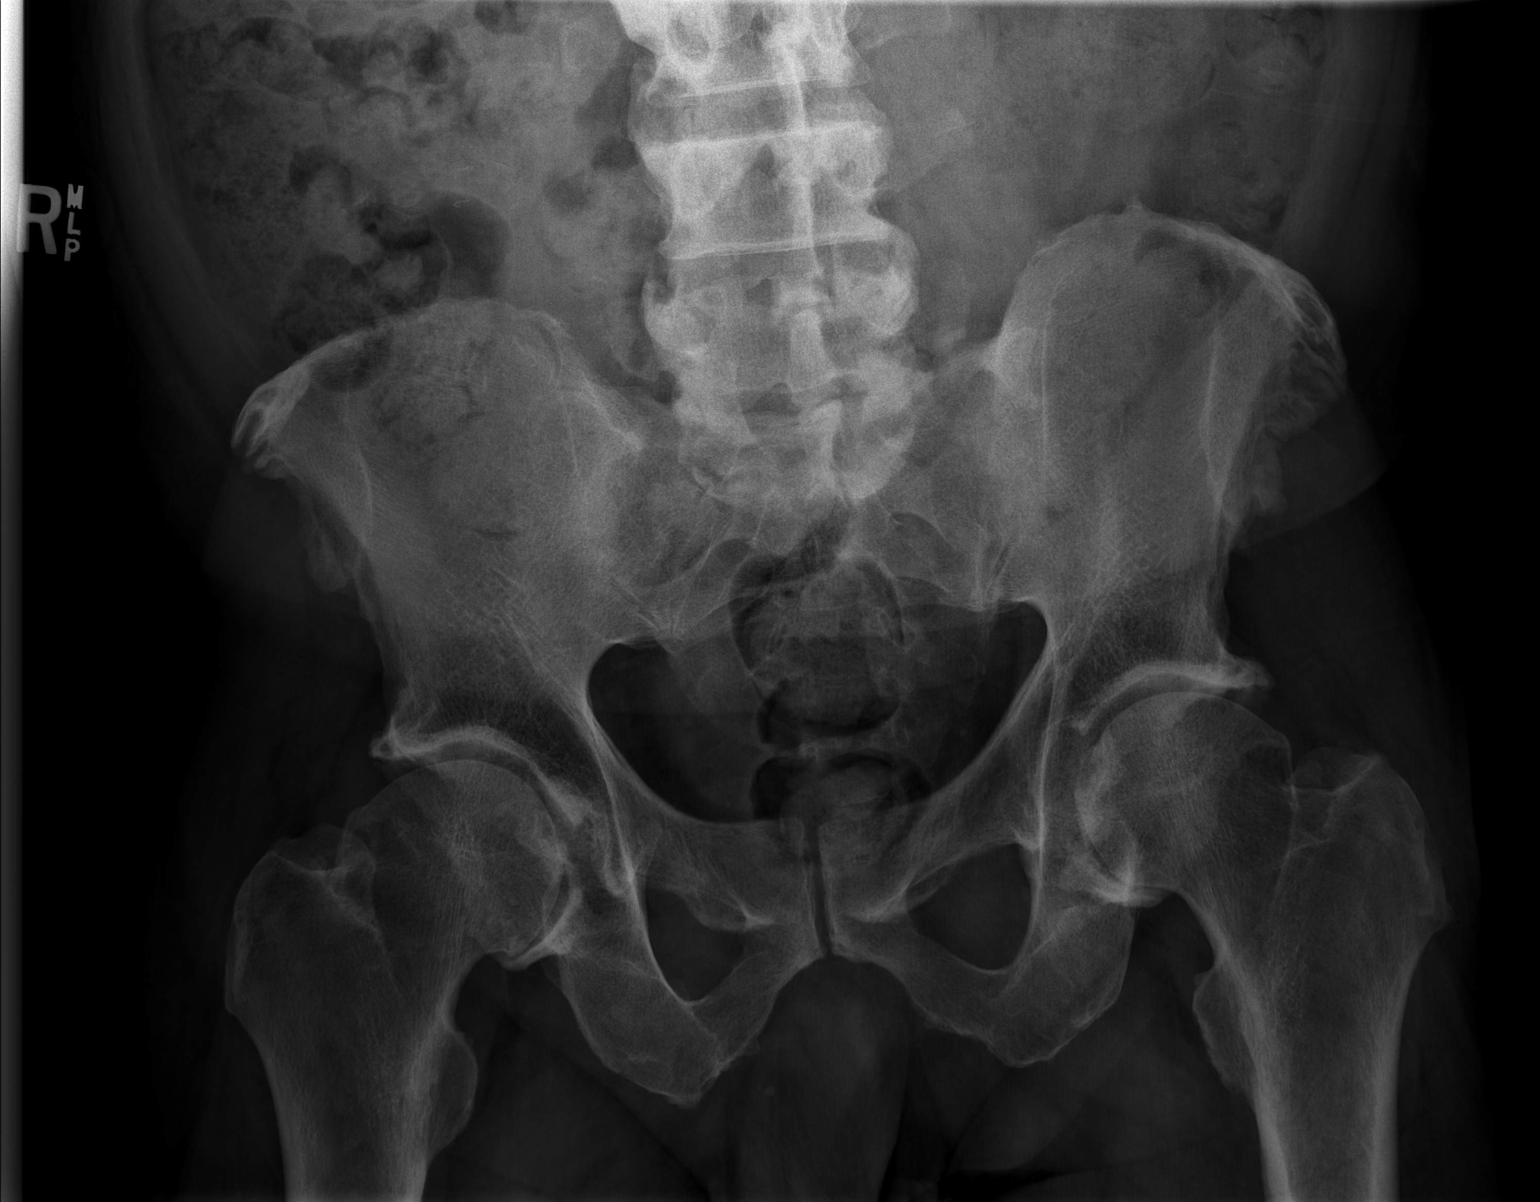

[t hip ap left]
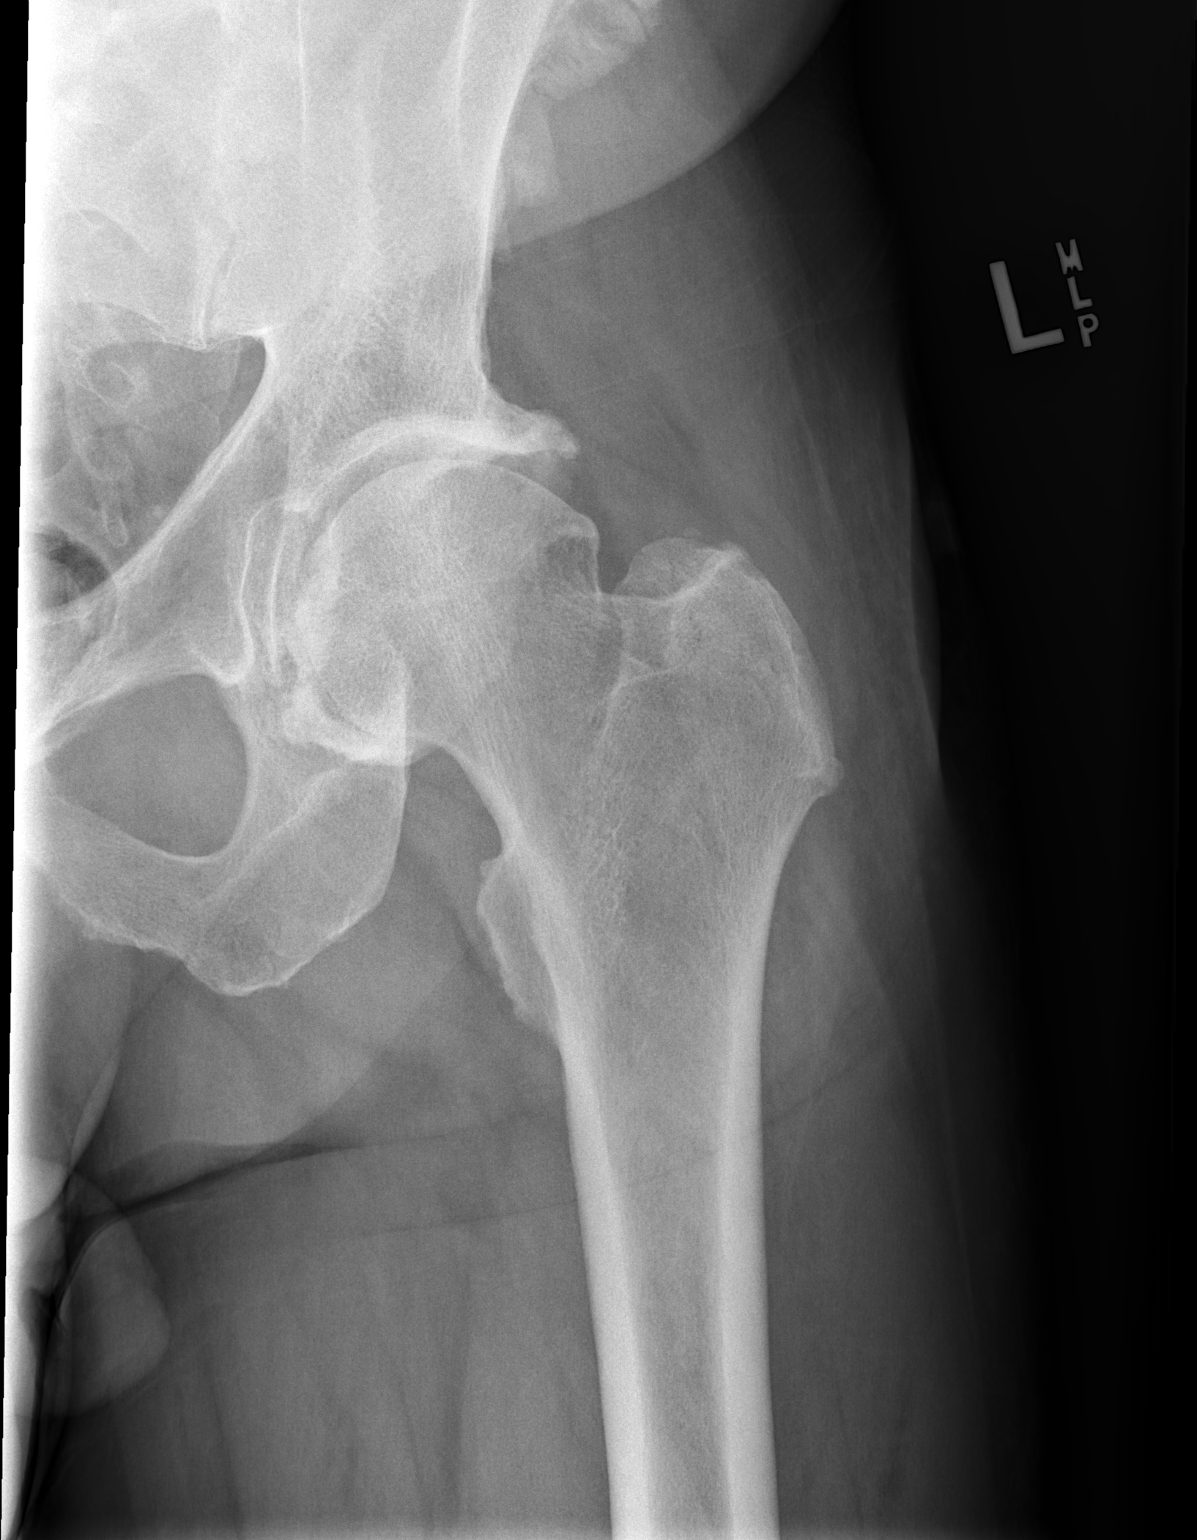

[t hip frog leg left]
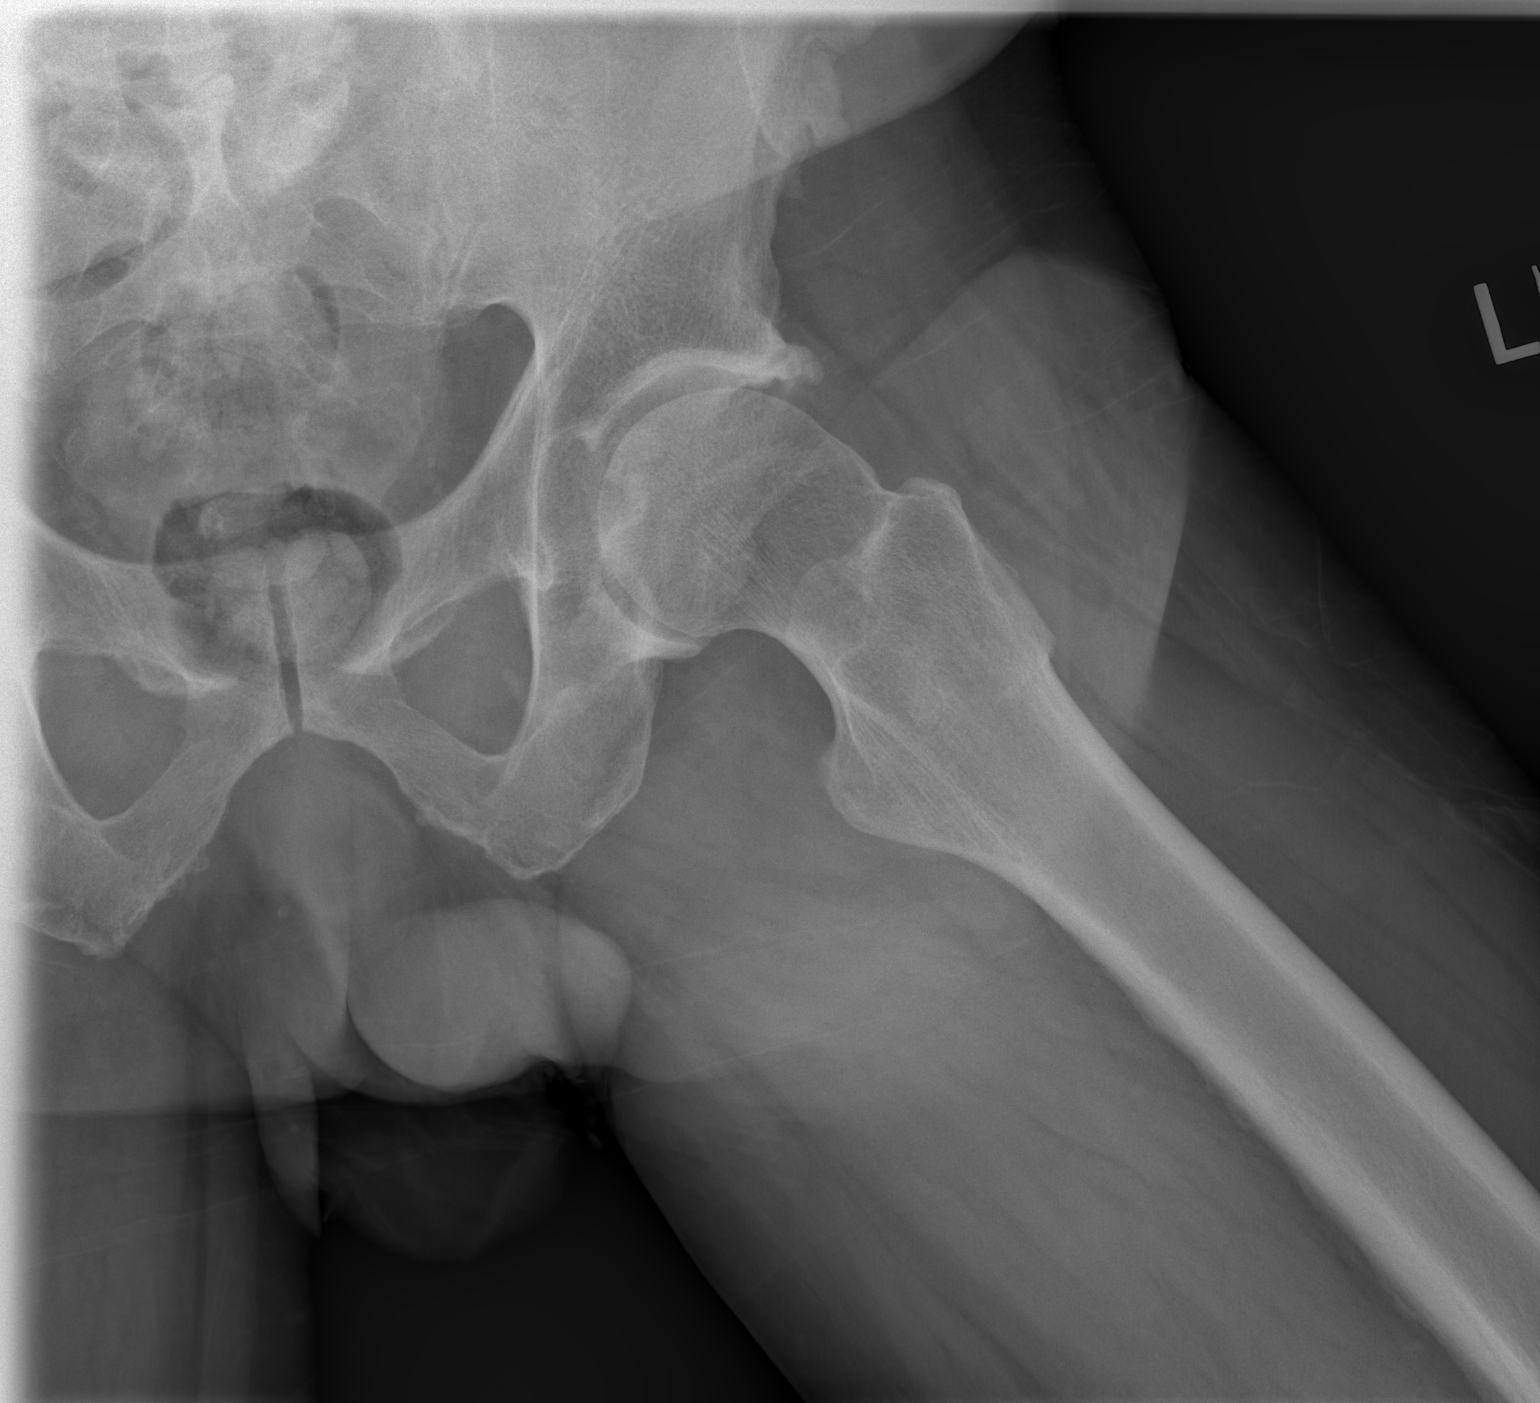

[3 of 3 positions shown; findings below may reference images not displayed]

FINDINGS: No fracture or dislocation of the left hip. There is moderate to
severe left femoroacetabular osteophytosis with mild superior joint
space narrowing, this appearance relatively symmetric to the right
hip.
IMPRESSION: No fracture or dislocation of the left hip. There is moderate to
severe left femoroacetabular osteophytosis with mild superior joint
space narrowing, this appearance relatively symmetric to the right
hip.

## 2020-01-29 MED ORDER — SODIUM CHLORIDE 0.9 % IV SOLN
Freq: Once | INTRAVENOUS | Status: AC
  Filled 2020-01-29: qty 250

## 2020-01-29 MED ORDER — TESTOSTERONE CYPIONATE 200 MG/ML IM SOLN
400.0000 mg | Freq: Once | INTRAMUSCULAR | Status: AC
  Administered 2020-01-29: 400 mg via INTRAMUSCULAR

## 2020-01-29 MED ORDER — EPOETIN ALFA-EPBX 40000 UNIT/ML IJ SOLN
INTRAMUSCULAR | Status: AC
  Filled 2020-01-29: qty 1

## 2020-01-29 MED ORDER — TESTOSTERONE CYPIONATE 200 MG/ML IM SOLN
INTRAMUSCULAR | Status: AC
  Filled 2020-01-29: qty 2

## 2020-01-29 MED ORDER — HEPARIN SOD (PORK) LOCK FLUSH 100 UNIT/ML IV SOLN
500.0000 [IU] | Freq: Once | INTRAVENOUS | Status: AC | PRN
  Administered 2020-01-29: 500 [IU]
  Filled 2020-01-29: qty 5

## 2020-01-29 MED ORDER — HYDROMORPHONE HCL 1 MG/ML IJ SOLN
2.0000 mg | Freq: Once | INTRAMUSCULAR | Status: AC
  Administered 2020-01-29: 2 mg via INTRAVENOUS

## 2020-01-29 MED ORDER — PALONOSETRON HCL INJECTION 0.25 MG/5ML
0.2500 mg | Freq: Once | INTRAVENOUS | Status: AC
  Administered 2020-01-29: 0.25 mg via INTRAVENOUS

## 2020-01-29 MED ORDER — SODIUM CHLORIDE 0.9 % IV SOLN
10.0000 mg | Freq: Once | INTRAVENOUS | Status: AC
  Administered 2020-01-29: 10 mg via INTRAVENOUS
  Filled 2020-01-29: qty 10

## 2020-01-29 MED ORDER — EPOETIN ALFA-EPBX 40000 UNIT/ML IJ SOLN: 40000.0000 [IU] | Freq: Once | INTRAMUSCULAR | Status: DC

## 2020-01-29 MED ORDER — PALONOSETRON HCL INJECTION 0.25 MG/5ML
INTRAVENOUS | Status: AC
  Filled 2020-01-29: qty 5

## 2020-01-29 MED ORDER — DEXTROSE 5 % IV SOLN
28.6000 mg/m2 | Freq: Once | INTRAVENOUS | Status: AC
  Administered 2020-01-29: 60 mg via INTRAVENOUS
  Filled 2020-01-29: qty 30

## 2020-01-29 MED ORDER — SODIUM CHLORIDE 0.9 % IV SOLN
300.0000 mg/m2 | Freq: Once | INTRAVENOUS | Status: AC
  Administered 2020-01-29: 620 mg via INTRAVENOUS
  Filled 2020-01-29: qty 31

## 2020-01-29 MED ORDER — HYDROMORPHONE HCL 4 MG/ML IJ SOLN
INTRAMUSCULAR | Status: AC
  Filled 2020-01-29: qty 1

## 2020-01-29 MED ORDER — SODIUM CHLORIDE 0.9% FLUSH
10.0000 mL | INTRAVENOUS | Status: DC | PRN
  Administered 2020-01-29: 10 mL
  Filled 2020-01-29: qty 10

## 2020-01-29 NOTE — Progress Notes (Signed)
Okay to treat with elevated SCr per Dr. Ennever. °

## 2020-01-29 NOTE — Progress Notes (Signed)
Pt discharged in no apparent distress. Pt left ambulatory with assistance.  Pt aware of discharge instructions and verbalized understanding and had no further questions.  

## 2020-01-29 NOTE — Progress Notes (Signed)
Hematology and Oncology Follow Up Visit  [name] [id number] [birth year] 69 y.o. 01/29/2020   Principle Diagnosis:  IgG kappa myeloma  Anemia secondary to renal insufficiency  Intermittent iron - deficiency anemia  Hypotestosteronemia  Current Therapy: Aredia 60 mg IV q 3 months-- next dose 03/2020 Aranesp 300 mcg subcu as needed for hemoglobin less than 11 DepoTestosterone400 mg q4weeks IV iron as indicated Kyprolis/Cytoxan - s/p cycle#26   Interim History:  [name] is here today for follow-up and treatment. He had a decent Thanksgiving.  He was with his family.  He enjoyed Thanksgiving.  He has been eating well.  He is doing a good job with his blood sugars.  He is trying to keep them at a relatively even level.  There has been no problems with respect to myeloma in the bloodstream.  Back in November, there is no monoclonal spike in his blood.  The IgG level was 1054 mg/dL.  His Kappa light chain was 5 mg/dL which is stable.  His last testosterone level in November was 541.  His last iron studies November showed a ferritin of 2000 with an iron saturation of 40%.  A lot of the ferritin elevation is secondary to inflammatory issues from his arthritis and collagen vascular issues.  He is on chronic pain medications.  This is being managed by his family doctor.  He has had no problems with fever.  He has had no issues with rashes.  He has had no obvious change in bowel or bladder habits.  He received Aredia back in November.  This helps with his bones.  His renal function is stable.  Again, this is not from the myeloma.  This is from diabetes and probably chronic hypertension.  Overall, I think his performance status is ECOG 2.    Allergies as of 01/29/2020      Reactions   Iodinated Diagnostic Agents Rash   Patient states he was instructed not to take IV contrast.  In 2008 he had an unknown reaction, and was told not to take it again.  He was also told  not to take it due to his kidneys.   Iodine Anxiety, Rash, Other (See Comments)   Didn't feel right "instructed not to take per MD--something with his port"      Medication List       Accurate as of January 29, 2020  9:31 AM. If you have any questions, ask your nurse or doctor.        amLODipine 5 MG tablet Commonly known as: NORVASC Take 5 mg by mouth 2 (two) times daily.   atorvastatin 40 MG tablet Commonly known as: LIPITOR Take 40 mg by mouth daily at 6 PM.   Carafate 1 GM/10ML suspension Generic drug: sucralfate Take 1 g by mouth 4 (four) times daily.   dicyclomine 10 MG capsule Commonly known as: BENTYL TAKE 1 CAPSULE (10 MG TOTAL) BY MOUTH 3 (THREE) TIMES DAILY BEFORE MEALS.   gabapentin 300 MG capsule Commonly known as: NEURONTIN TAKE 1 CAPSULE BY MOUTH THREE TIMES A DAY What changed:   how much to take  how to take this  when to take this  additional instructions   glimepiride 2 MG tablet Commonly known as: AMARYL Take 2 mg by mouth daily with breakfast.   lactulose 10 GM/15ML solution Commonly known as: CHRONULAC Take 30 mLs (20 g total) by mouth daily.   losartan 50 MG tablet Commonly known as: COZAAR Take 50 mg by mouth daily.  [place] 0.01 % Soln Generic drug: bimatoprost SMARTSIG:In Eye(s)   metFORMIN 500 MG tablet Commonly known as: GLUCOPHAGE Take 500 mg by mouth 2 (two) times daily.   metoprolol succinate 25 MG 24 hr tablet Commonly known as: TOPROL-XL Take 25 mg by mouth daily.   mirabegron ER 50 MG Tb24 tablet Commonly known as: MYRBETRIQ Take 50 mg by mouth daily.   morphine 30 MG 12 hr tablet Commonly known as: MS CONTIN Take 30 mg by mouth 2 (two) times daily.   omeprazole 40 MG capsule Commonly known as: PRILOSEC TAKE 1 CAPSULE BY MOUTH TWICE A DAY   ondansetron 8 MG tablet Commonly known as: ZOFRAN TAKE 1 TABLET(8 MG) BY MOUTH TWICE DAILY AS NEEDED FOR NAUSEA OR VOMITING   oxyCODONE-acetaminophen 10-325 MG  tablet Commonly known as: PERCOCET Take 1 tablet by mouth every 6 (six) hours as needed for pain.   pioglitazone 30 MG tablet Commonly known as: ACTOS Take 30 mg by mouth daily.   prochlorperazine 10 MG tablet Commonly known as: COMPAZINE take 1 tablet by mouth every 6 hours if needed for nausea and vomiting   Restasis 0.05 % ophthalmic emulsion Generic drug: cycloSPORINE Place 1 drop into both eyes daily.   SUMAtriptan 100 MG tablet Commonly known as: IMITREX Take 100 mg by mouth daily.   topiramate 25 MG tablet Commonly known as: TOPAMAX Take 25 mg by mouth daily.   torsemide 20 MG tablet Commonly known as: DEMADEX Take 40 mg by mouth daily.   Trulicity 0.25 EN/2.7PO Sopn Generic drug: Dulaglutide Inject 0.75 mg into the skin every Saturday.   Vitamin D (Ergocalciferol) 1.25 MG (50000 UNIT) Caps capsule Commonly known as: DRISDOL Take 50,000 Units by mouth every Saturday.       Allergies:  Allergies  Allergen Reactions  . Iodinated Diagnostic Agents Rash    Patient states he was instructed not to take IV contrast.  In 2008 he had an unknown reaction, and was told not to take it again.  He was also told not to take it due to his kidneys.  . Iodine Anxiety, Rash and Other (See Comments)    Didn't feel right "instructed not to take per MD--something with his port"     Past Medical History, Surgical history, Social history, and Family History were reviewed and updated.  Review of Systems: Review of Systems  Constitutional: Positive for malaise/fatigue.  HENT: Negative.   Eyes: Negative.   Respiratory: Negative.   Cardiovascular: Negative.   Gastrointestinal: Positive for heartburn.  Genitourinary: Negative.   Musculoskeletal: Positive for joint pain and myalgias.  Skin: Negative.   Neurological: Positive for tingling.  Endo/Heme/Allergies: Negative.   Psychiatric/Behavioral: Negative.      Physical Exam:  weight is 192 lb (87.1 kg). His oral  temperature is 98.1 F (36.7 C). His blood pressure is 159/79 (abnormal) and his pulse is 93. His respiration is 18 and oxygen saturation is 100%.   Wt Readings from Last 3 Encounters:  01/29/20 192 lb (87.1 kg)  01/01/20 188 lb 0.6 oz (85.3 kg)  12/07/19 187 lb (84.8 kg)    Physical Exam Vitals reviewed.  HENT:     Head: Normocephalic and atraumatic.  Eyes:     Pupils: Pupils are equal, round, and reactive to light.  Cardiovascular:     Rate and Rhythm: Normal rate and regular rhythm.     Heart sounds: Normal heart sounds.  Pulmonary:     Effort: Pulmonary effort is normal.  Breath sounds: Normal breath sounds.  Abdominal:     General: Bowel sounds are normal.     Palpations: Abdomen is soft.  Musculoskeletal:        General: No tenderness or deformity. Normal range of motion.     Cervical back: Normal range of motion.  Lymphadenopathy:     Cervical: No cervical adenopathy.  Skin:    General: Skin is warm and dry.     Findings: No erythema or rash.  Neurological:     Mental Status: He is alert and oriented to person, place, and time.  Psychiatric:        Behavior: Behavior normal.        Thought Content: Thought content normal.        Judgment: Judgment normal.      Lab Results  Component Value Date   WBC 4.8 01/29/2020   HGB 10.1 (L) 01/29/2020   HCT 32.4 (L) 01/29/2020   MCV 82.2 01/29/2020   PLT 179 01/29/2020   Lab Results  Component Value Date   FERRITIN 2,006 (H) 01/01/2020   IRON 100 01/01/2020   TIBC 253 01/01/2020   UIBC 152 01/01/2020   IRONPCTSAT 40 01/01/2020   Lab Results  Component Value Date   RETICCTPCT 1.0 01/29/2020   RBC 3.89 (L) 01/29/2020   RBC 3.94 (L) 01/29/2020   RETICCTABS 27.5 03/21/2014   Lab Results  Component Value Date   KPAFRELGTCHN 49.9 (H) 01/01/2020   LAMBDASER 23.7 01/01/2020   KAPLAMBRATIO 2.11 (H) 01/01/2020   Lab Results  Component Value Date   IGGSERUM 1,054 01/01/2020   IGA 218 01/01/2020   IGMSERUM  36 01/01/2020   Lab Results  Component Value Date   TOTALPROTELP 6.8 01/01/2020   ALBUMINELP 3.5 01/01/2020   A1GS 0.2 01/01/2020   A2GS 1.0 01/01/2020   BETS 1.0 01/01/2020   BETA2SER 0.5 11/07/2014   GAMS 1.0 01/01/2020   MSPIKE Not Observed 01/01/2020   SPEI Comment 12/07/2019     Chemistry      Component Value Date/Time   NA 141 01/29/2020 0849   NA 143 02/22/2017 0744   K 3.7 01/29/2020 0849   K 4.0 02/22/2017 0744   CL 100 01/29/2020 0849   CL 106 02/22/2017 0744   CO2 33 (H) 01/29/2020 0849   CO2 27 02/22/2017 0744   BUN 29 (H) 01/29/2020 0849   BUN 26 (H) 02/22/2017 0744   CREATININE 2.41 (H) 01/29/2020 0849   CREATININE 2.3 (H) 02/22/2017 0744      Component Value Date/Time   CALCIUM 9.6 01/29/2020 0849   CALCIUM 9.3 02/22/2017 0744   ALKPHOS 93 01/29/2020 0849   ALKPHOS 80 02/22/2017 0744   AST 22 01/29/2020 0849   ALT 15 01/29/2020 0849   ALT 22 02/22/2017 0744   BILITOT 0.4 01/29/2020 0849       Impression and Plan: [name] is a very pleasant 69 yo African American gentleman with IgG kappa myeloma.  We will go ahead with chemotherapy today.  The treatment is certainly do seem to be helping.  Again, his myeloma really has not been much of a problem.  We are certainly not treating him on a regular basis which is nice for him.  His main problem has been arthritic.  Her diabetes also a problem.  I just do not think that he will ever have a problem with the myeloma.  I forgot to mention that he is having some intestinal problems.  I think he  has seen gastroenterology.  I am unsure exactly what might be going on.  I do not know if this is diverticulitis.  If he needs any kind of treatment for the intestinal issue, again, from my point of view, I do not see a problem with this.  We will plan to get him back in another 4 weeks or so.   Volanda Napoleon, MD 12/6/20219:31 AM

## 2020-01-29 NOTE — Patient Instructions (Signed)
[facility] Discharge Instructions for Patients Receiving Chemotherapy  Today you received the following chemotherapy agents Kyprolis, Cytoxan  To help prevent nausea and vomiting after your treatment, we encourage you to take your nausea medication    If you develop nausea and vomiting that is not controlled by your nausea medication, call the clinic.   BELOW ARE SYMPTOMS THAT SHOULD BE REPORTED IMMEDIATELY:  *FEVER GREATER THAN 100.5 F  *CHILLS WITH OR WITHOUT FEVER  NAUSEA AND VOMITING THAT IS NOT CONTROLLED WITH YOUR NAUSEA MEDICATION  *UNUSUAL SHORTNESS OF BREATH  *UNUSUAL BRUISING OR BLEEDING  TENDERNESS IN MOUTH AND THROAT WITH OR WITHOUT PRESENCE OF ULCERS  *URINARY PROBLEMS  *BOWEL PROBLEMS  UNUSUAL RASH Items with * indicate a potential emergency and should be followed up as soon as possible.  Feel free to call the clinic should you have any questions or concerns. The clinic phone number is [phone].  Please show the CHEMO ALERT CARD at check-in to the Emergency Department and triage nurse.   

## 2020-01-30 LAB — KAPPA/LAMBDA LIGHT CHAINS
Kappa free light chain: 47.9 mg/L — ABNORMAL HIGH (ref 3.3–19.4)
Kappa, lambda light chain ratio: 2.04 — ABNORMAL HIGH (ref 0.26–1.65)
Lambda free light chains: 23.5 mg/L (ref 5.7–26.3)

## 2020-01-30 LAB — TESTOSTERONE: Testosterone: 255 ng/dL — ABNORMAL LOW (ref 264–916)

## 2020-01-30 LAB — IGG, IGA, IGM
IgA: 221 mg/dL (ref 61–437)
IgG (Immunoglobin G), Serum: 1109 mg/dL (ref 603–1613)
IgM (Immunoglobulin M), Srm: 35 mg/dL (ref 20–172)

## 2020-01-31 LAB — PROTEIN ELECTROPHORESIS, SERUM
A/G Ratio: 1.1 (ref 0.7–1.7)
Albumin ELP: 3.9 g/dL (ref 2.9–4.4)
Alpha-1-Globulin: 0.2 g/dL (ref 0.0–0.4)
Alpha-2-Globulin: 1.1 g/dL — ABNORMAL HIGH (ref 0.4–1.0)
Beta Globulin: 1 g/dL (ref 0.7–1.3)
Gamma Globulin: 1.1 g/dL (ref 0.4–1.8)
Globulin, Total: 3.5 g/dL (ref 2.2–3.9)
Total Protein ELP: 7.4 g/dL (ref 6.0–8.5)

## 2020-02-01 ENCOUNTER — Other Ambulatory Visit: Payer: 59

## 2020-02-01 ENCOUNTER — Ambulatory Visit: Payer: 59

## 2020-02-01 ENCOUNTER — Ambulatory Visit: Payer: 59 | Admitting: Family

## 2020-02-04 IMAGING — US VENOUS DOPPLER ULTRASOUND OF  LOWER EXTREMITIES
1 series · 12 of 24 positions shown · non-contrast
Comparison: None.

CLINICAL DATA: Bilateral leg swelling with varicose veins.



[Series 1: venous doppler ultrasound of lower extremities · 12 of 71 slices shown]
[im 4/71]
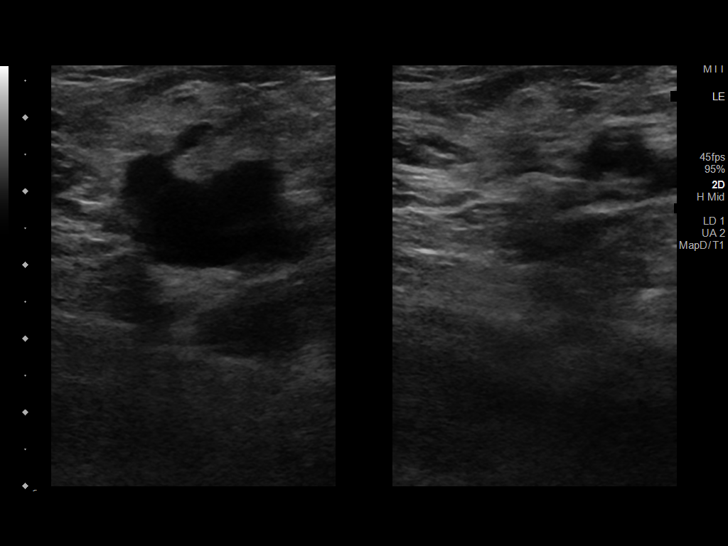
[im 10/71]
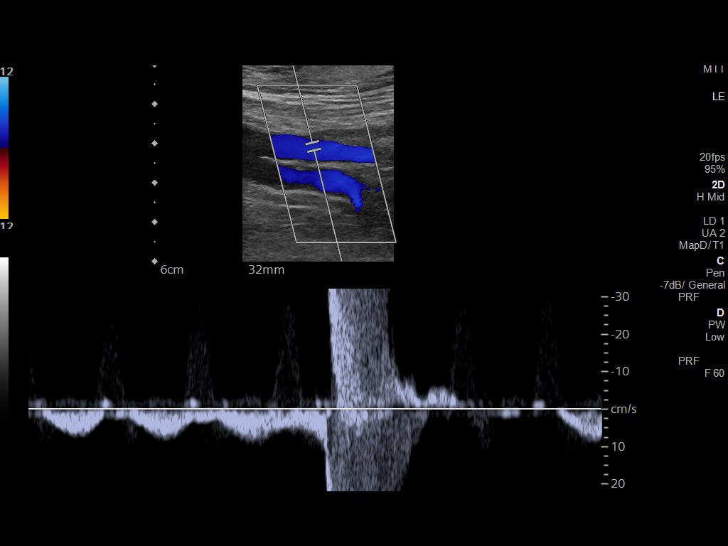
[im 16/71]
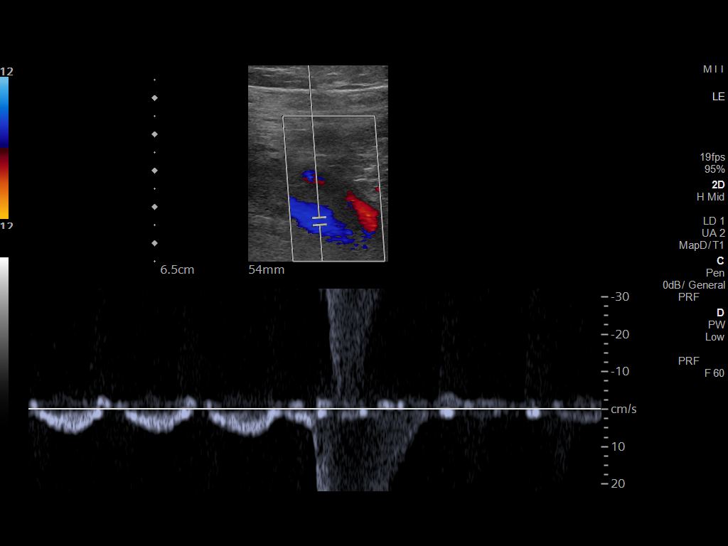
[im 22/71]
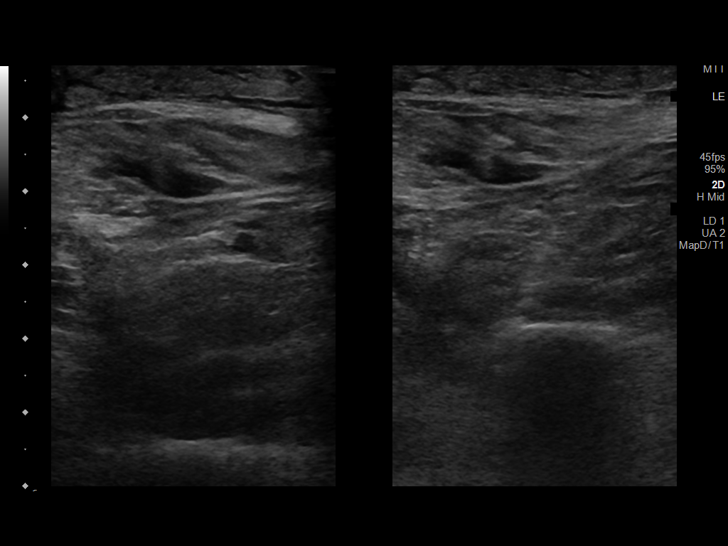
[im 28/71]
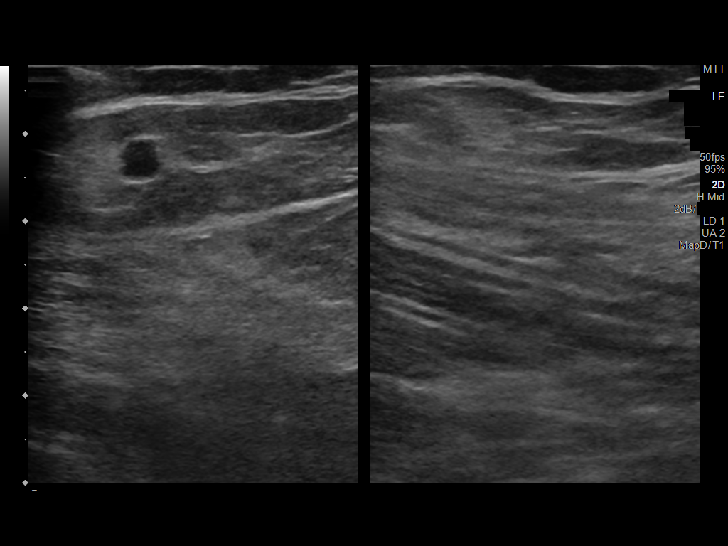
[im 34/71]
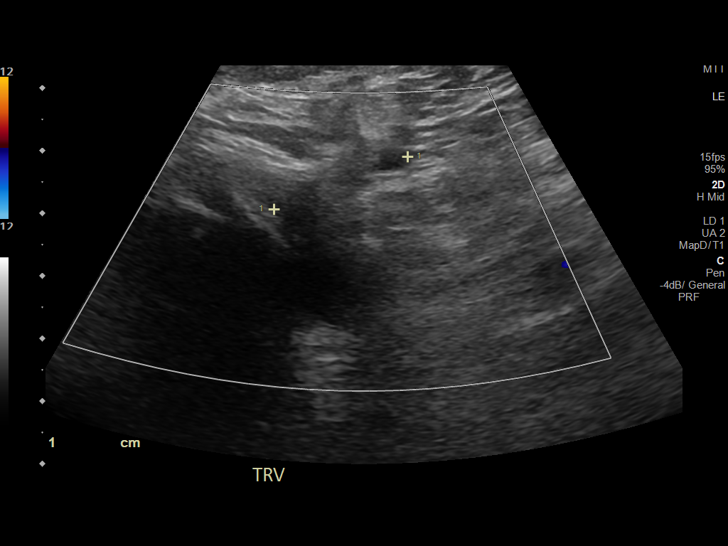
[im 40/71]
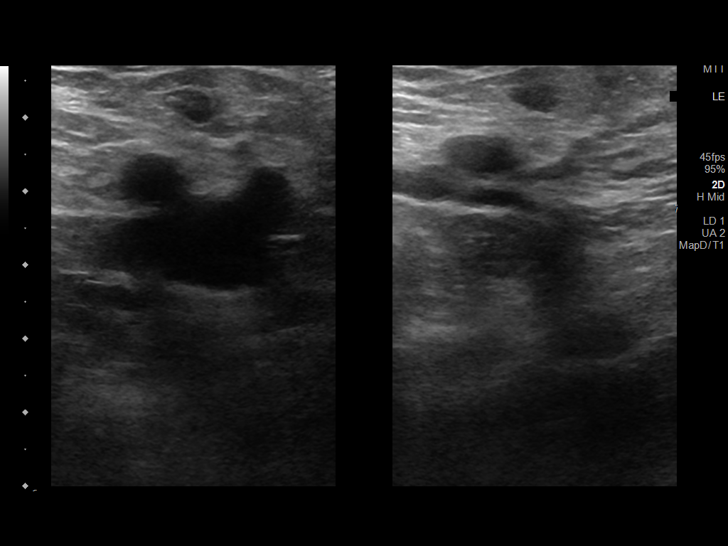
[im 46/71]
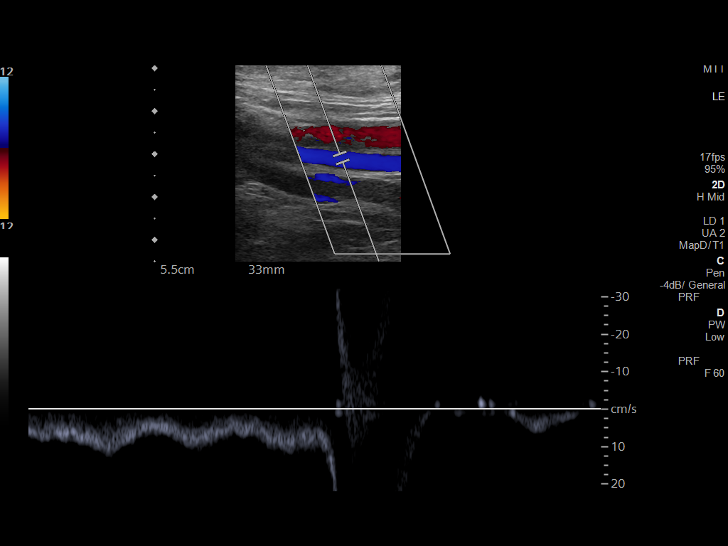
[im 52/71]
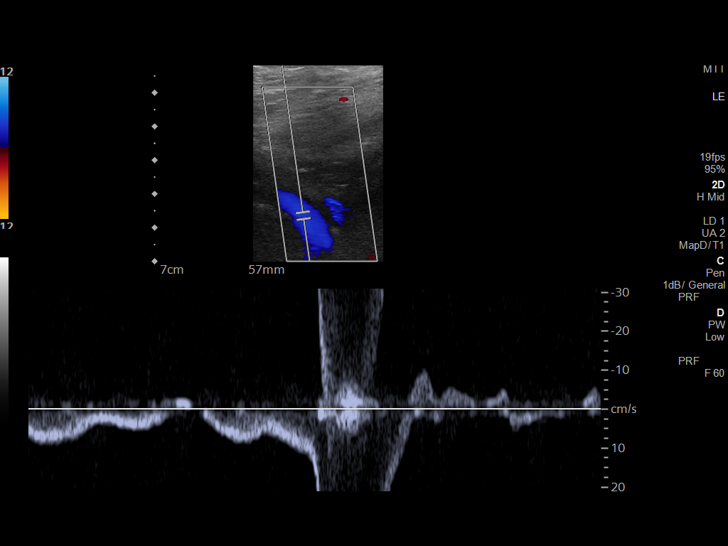
[im 58/71]
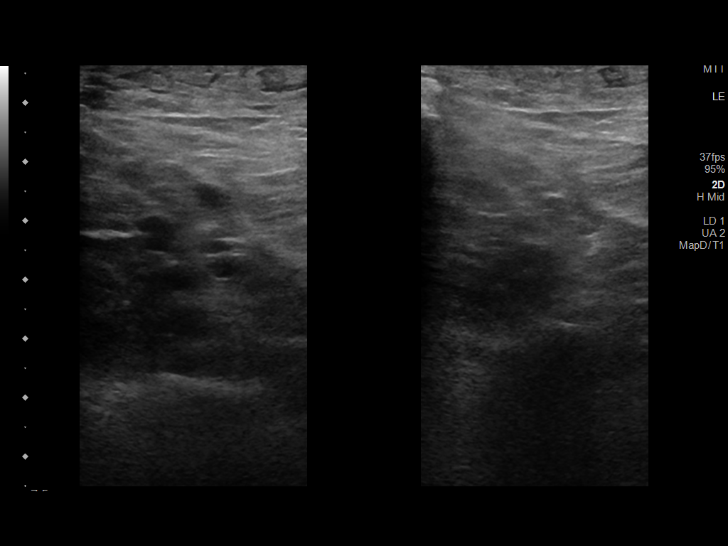
[im 64/71]
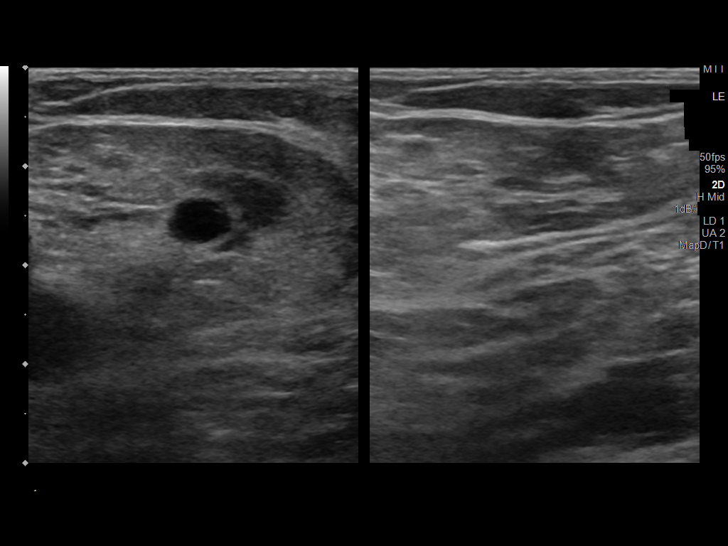
[im 71/71]
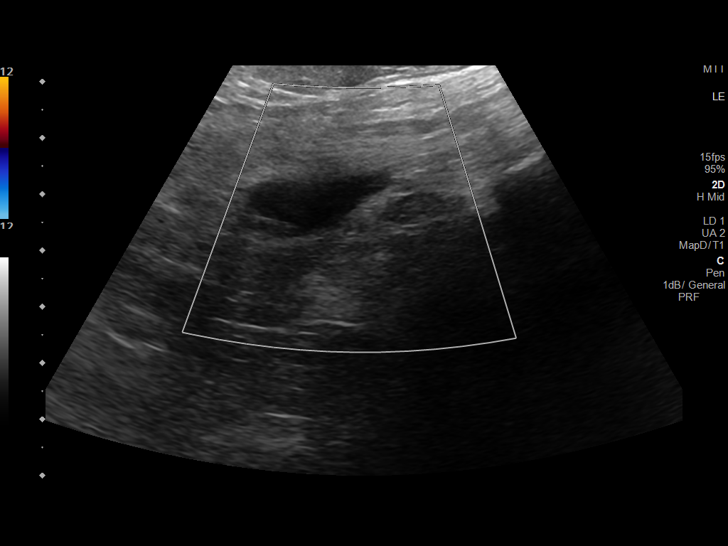

[12 of 24 positions shown; findings below may reference images not displayed]

FINDINGS: RIGHT LOWER EXTREMITY

Common Femoral Vein: No evidence of thrombus. Normal
compressibility, respiratory phasicity and response to augmentation.

Saphenofemoral Junction: No evidence of thrombus. Normal
compressibility and flow on color Doppler imaging.

Profunda Femoral Vein: No evidence of thrombus. Normal
compressibility and flow on color Doppler imaging.

Femoral Vein: No evidence of thrombus. Normal compressibility,
respiratory phasicity and response to augmentation.

Popliteal Vein: No evidence of thrombus. Normal compressibility,
respiratory phasicity and response to augmentation.

Calf Veins: No evidence of thrombus. Normal compressibility and flow
on color Doppler imaging.

Superficial Great Saphenous Vein: No evidence of thrombus. Normal
compressibility.

Other Findings: Subcutaneous edema in the right ankle. Right short
saphenous vein is compressible without thrombus.

Small hypoechoic collection in the right popliteal fossa measuring
2.2 x 0.8 x 3.4 cm. No significant blood flow within the small
hypoechoic collection.

LEFT LOWER EXTREMITY

Common Femoral Vein: No evidence of thrombus. Normal
compressibility, respiratory phasicity and response to augmentation.

Saphenofemoral Junction: No evidence of thrombus. Normal
compressibility and flow on color Doppler imaging.

Profunda Femoral Vein: No evidence of thrombus. Normal
compressibility and flow on color Doppler imaging.

Femoral Vein: No evidence of thrombus. Normal compressibility,
respiratory phasicity and response to augmentation.

Popliteal Vein: No evidence of thrombus. Normal compressibility,
respiratory phasicity and response to augmentation.

Calf Veins: No evidence of thrombus. Normal compressibility and flow
on color Doppler imaging.

Superficial Great Saphenous Vein: No evidence of thrombus. Normal
compressibility.

Other Findings: Normal compressibility in the left short saphenous
vein without thrombus. Subcutaneous edema in the left ankle.

Hypoechoic collection in the left popliteal fossa that measures
x 1.8 x 2.3 cm. Findings most compatible with a small fluid
collection.
IMPRESSION: No evidence of deep venous thrombosis in either lower extremity.

Small hypoechoic collections in the popliteal fossa bilaterally.
Findings compatible with small bilateral Baker's cysts.

## 2020-02-04 IMAGING — DX PORTABLE CHEST - 1 VIEW
1 series · 1 of 1 positions shown · non-contrast
Comparison: 06/28/2017

CLINICAL DATA: 68-year-old male with swelling

EXAM:
PORTABLE CHEST 1 VIEW

[chest ap]
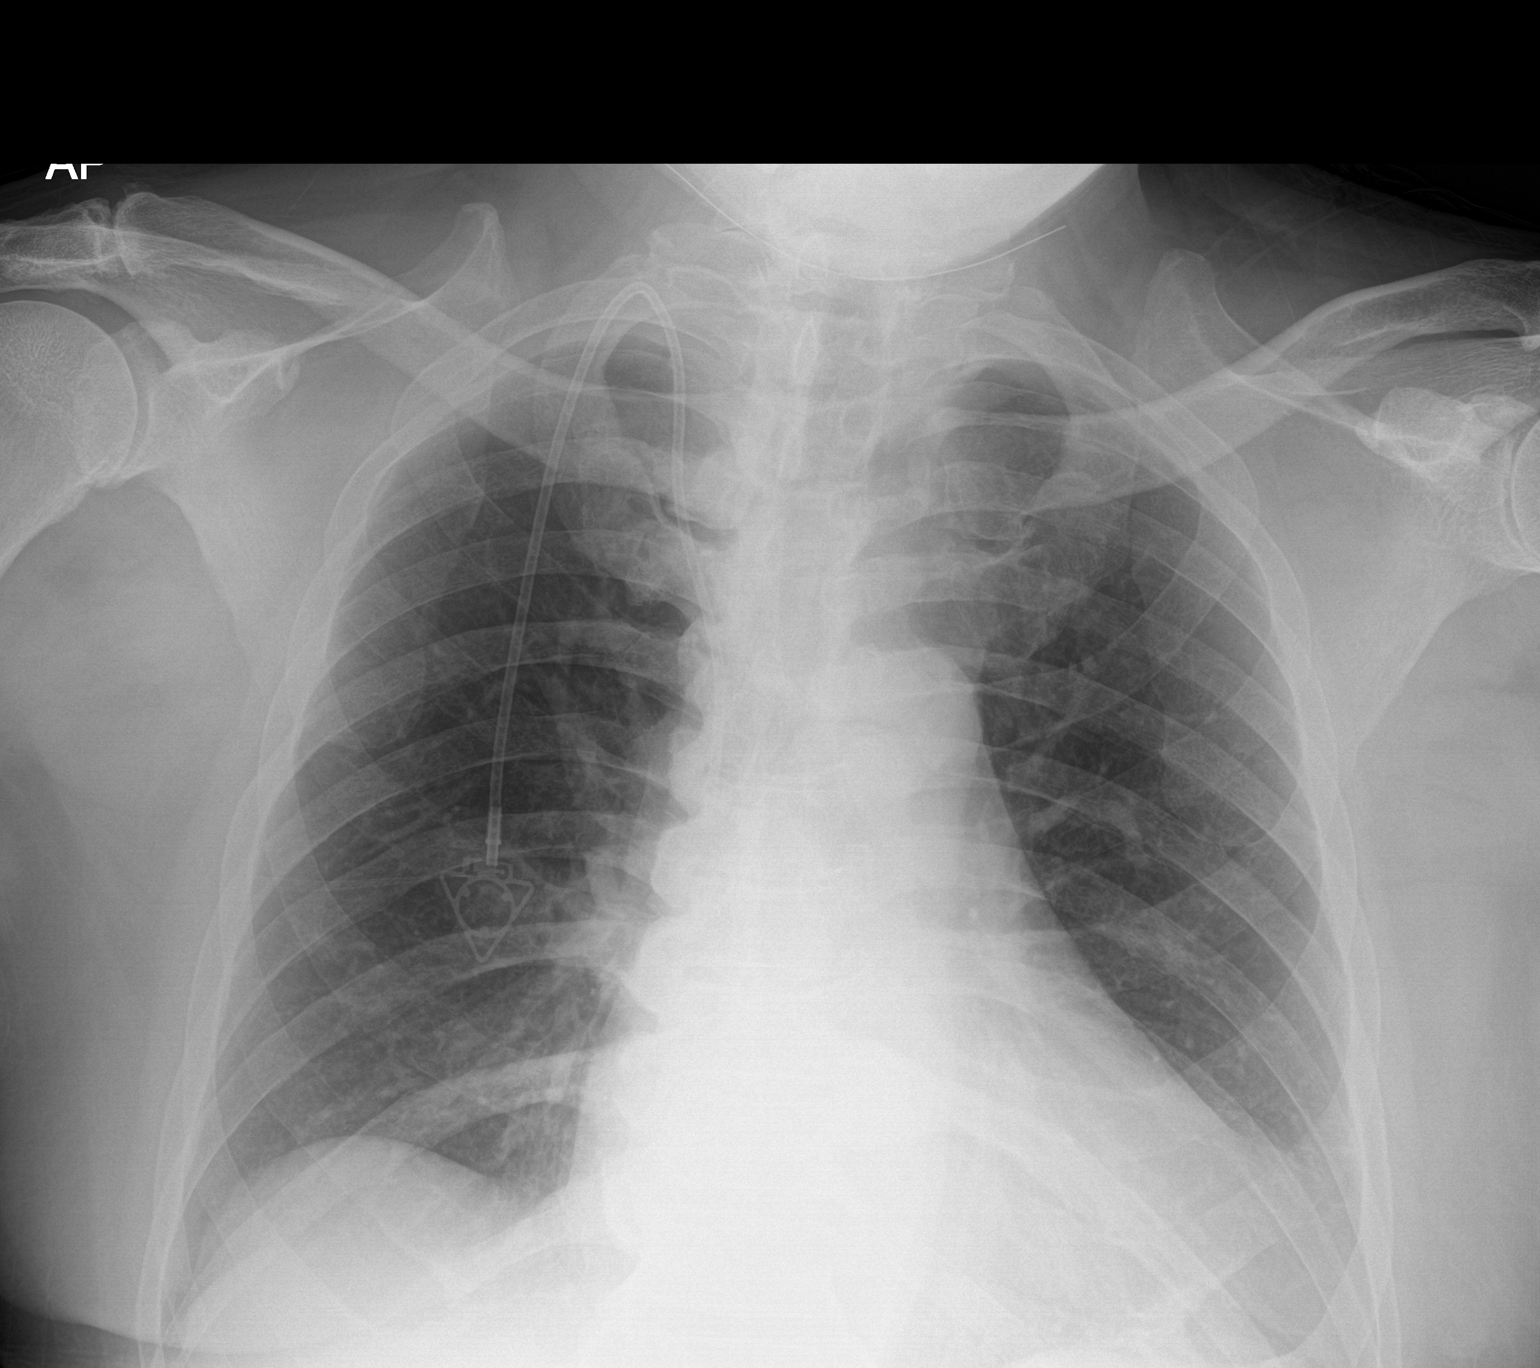

[1 of 1 positions shown; findings below may reference images not displayed]

FINDINGS: Cardiomediastinal silhouette unchanged in size and contour. No
evidence of central vascular congestion. No interlobular septal
thickening.

Right IJ central venous catheter with the tip appearing to terminate
superior vena cava.

No pneumothorax or pleural effusion. No confluent airspace disease.
No displaced fracture
IMPRESSION: Negative for acute cardiopulmonary disease

## 2020-02-06 ENCOUNTER — Encounter: Payer: Self-pay | Admitting: Podiatry

## 2020-02-06 ENCOUNTER — Other Ambulatory Visit: Payer: 59

## 2020-02-06 ENCOUNTER — Ambulatory Visit: Payer: 59 | Admitting: Family

## 2020-02-06 ENCOUNTER — Ambulatory Visit: Payer: 59

## 2020-02-06 ENCOUNTER — Other Ambulatory Visit: Payer: Self-pay

## 2020-02-06 ENCOUNTER — Ambulatory Visit (INDEPENDENT_AMBULATORY_CARE_PROVIDER_SITE_OTHER): Payer: Medicare Other | Admitting: Podiatry

## 2020-02-06 DIAGNOSIS — B351 Tinea unguium: Secondary | ICD-10-CM

## 2020-02-06 DIAGNOSIS — M79675 Pain in left toe(s): Secondary | ICD-10-CM | POA: Diagnosis not present

## 2020-02-06 DIAGNOSIS — M79674 Pain in right toe(s): Secondary | ICD-10-CM | POA: Diagnosis not present

## 2020-02-06 NOTE — Progress Notes (Signed)
This patient returns to my office for at risk foot care.  This patient requires this care by a professional since this patient will be at risk due to having diabetes type 2.  This patient is unable to cut nails himself since the patient cannot reach his nails.These nails are painful walking and wearing shoes.  This patient presents for at risk foot care today.  General Appearance  Alert, conversant and in no acute stress.  Vascular  Dorsalis pedis and posterior tibial  pulses are weakly  palpable  bilaterally.  Capillary return is within normal limits  bilaterally. Temperature is within normal limits  bilaterally.  Neurologic  Senn-Weinstein monofilament wire test within normal limits  bilaterally. Muscle power within normal limits bilaterally.  Nails Thick disfigured discolored nails with subungual debris  from hallux to fifth toes bilaterally. No evidence of bacterial infection or drainage bilaterally.  Orthopedic  No limitations of motion  feet .  No crepitus or effusions noted.  No bony pathology or digital deformities noted.  HAV  B/L.  Exostosis right foot.  Skin  normotropic skin with no porokeratosis noted bilaterally.  No signs of infections or ulcers noted.     Onychomycosis  Pain in right toes  Pain in left toes  Consent was obtained for treatment procedures.   Mechanical debridement of nails 1-5  bilaterally performed with a nail nipper.  Filed with dremel without incident.    Return office visit    10 weeks                  Told patient to return for periodic foot care and evaluation due to potential at risk complications.   Gardiner Barefoot DPM

## 2020-02-26 ENCOUNTER — Inpatient Hospital Stay: Payer: 59

## 2020-02-26 ENCOUNTER — Inpatient Hospital Stay (HOSPITAL_BASED_OUTPATIENT_CLINIC_OR_DEPARTMENT_OTHER): Payer: 59 | Admitting: Hematology & Oncology

## 2020-02-26 ENCOUNTER — Other Ambulatory Visit: Payer: 59

## 2020-02-26 ENCOUNTER — Ambulatory Visit: Payer: 59

## 2020-02-26 ENCOUNTER — Inpatient Hospital Stay: Payer: 59 | Attending: Hematology & Oncology

## 2020-02-26 ENCOUNTER — Other Ambulatory Visit: Payer: Self-pay

## 2020-02-26 ENCOUNTER — Encounter: Payer: Self-pay | Admitting: Family

## 2020-02-26 ENCOUNTER — Ambulatory Visit: Payer: 59 | Admitting: Hematology & Oncology

## 2020-02-26 VITALS — BP 166/72 | HR 98 | Temp 98.0°F | Resp 18 | Wt 180.0 lb

## 2020-02-26 DIAGNOSIS — Z299 Encounter for prophylactic measures, unspecified: Secondary | ICD-10-CM

## 2020-02-26 DIAGNOSIS — Z5112 Encounter for antineoplastic immunotherapy: Secondary | ICD-10-CM | POA: Insufficient documentation

## 2020-02-26 DIAGNOSIS — D631 Anemia in chronic kidney disease: Secondary | ICD-10-CM

## 2020-02-26 DIAGNOSIS — C9000 Multiple myeloma not having achieved remission: Secondary | ICD-10-CM | POA: Insufficient documentation

## 2020-02-26 DIAGNOSIS — Z5111 Encounter for antineoplastic chemotherapy: Secondary | ICD-10-CM | POA: Insufficient documentation

## 2020-02-26 DIAGNOSIS — N189 Chronic kidney disease, unspecified: Secondary | ICD-10-CM

## 2020-02-26 DIAGNOSIS — D500 Iron deficiency anemia secondary to blood loss (chronic): Secondary | ICD-10-CM

## 2020-02-26 DIAGNOSIS — M4712 Other spondylosis with myelopathy, cervical region: Secondary | ICD-10-CM

## 2020-02-26 DIAGNOSIS — E291 Testicular hypofunction: Secondary | ICD-10-CM | POA: Diagnosis not present

## 2020-02-26 DIAGNOSIS — E1122 Type 2 diabetes mellitus with diabetic chronic kidney disease: Secondary | ICD-10-CM | POA: Diagnosis not present

## 2020-02-26 DIAGNOSIS — E349 Endocrine disorder, unspecified: Secondary | ICD-10-CM

## 2020-02-26 LAB — RETICULOCYTES
Immature Retic Fract: 13.3 % (ref 2.3–15.9)
RBC.: 4.24 MIL/uL (ref 4.22–5.81)
Retic Count, Absolute: 41.1 10*3/uL (ref 19.0–186.0)
Retic Ct Pct: 1 % (ref 0.4–3.1)

## 2020-02-26 LAB — CBC WITH DIFFERENTIAL (CANCER CENTER ONLY)
Abs Immature Granulocytes: 0.09 10*3/uL — ABNORMAL HIGH (ref 0.00–0.07)
Basophils Absolute: 0 10*3/uL (ref 0.0–0.1)
Basophils Relative: 0 %
Eosinophils Absolute: 0 10*3/uL (ref 0.0–0.5)
Eosinophils Relative: 0 %
HCT: 34 % — ABNORMAL LOW (ref 39.0–52.0)
Hemoglobin: 10.9 g/dL — ABNORMAL LOW (ref 13.0–17.0)
Immature Granulocytes: 1 %
Lymphocytes Relative: 14 %
Lymphs Abs: 1.1 10*3/uL (ref 0.7–4.0)
MCH: 25.8 pg — ABNORMAL LOW (ref 26.0–34.0)
MCHC: 32.1 g/dL (ref 30.0–36.0)
MCV: 80.6 fL (ref 80.0–100.0)
Monocytes Absolute: 0.8 10*3/uL (ref 0.1–1.0)
Monocytes Relative: 11 %
Neutro Abs: 5.7 10*3/uL (ref 1.7–7.7)
Neutrophils Relative %: 74 %
Platelet Count: 196 10*3/uL (ref 150–400)
RBC: 4.22 MIL/uL (ref 4.22–5.81)
RDW: 15.8 % — ABNORMAL HIGH (ref 11.5–15.5)
WBC Count: 7.7 10*3/uL (ref 4.0–10.5)
nRBC: 0 % (ref 0.0–0.2)

## 2020-02-26 LAB — CMP (CANCER CENTER ONLY)
ALT: 22 U/L (ref 0–44)
AST: 18 U/L (ref 15–41)
Albumin: 4.5 g/dL (ref 3.5–5.0)
Alkaline Phosphatase: 82 U/L (ref 38–126)
Anion gap: 8 (ref 5–15)
BUN: 40 mg/dL — ABNORMAL HIGH (ref 8–23)
CO2: 31 mmol/L (ref 22–32)
Calcium: 9.8 mg/dL (ref 8.9–10.3)
Chloride: 102 mmol/L (ref 98–111)
Creatinine: 2.44 mg/dL — ABNORMAL HIGH (ref 0.61–1.24)
GFR, Estimated: 28 mL/min — ABNORMAL LOW (ref >60)
Glucose, Bld: 115 mg/dL — ABNORMAL HIGH (ref 70–99)
Potassium: 3.9 mmol/L (ref 3.5–5.1)
Sodium: 141 mmol/L (ref 135–145)
Total Bilirubin: 0.3 mg/dL (ref 0.3–1.2)
Total Protein: 7.8 g/dL (ref 6.5–8.1)

## 2020-02-26 LAB — IRON AND TIBC
Iron: 101 ug/dL (ref 42–163)
Saturation Ratios: 41 % (ref 20–55)
TIBC: 244 ug/dL (ref 202–409)
UIBC: 143 ug/dL (ref 117–376)

## 2020-02-26 LAB — LACTATE DEHYDROGENASE: LDH: 181 U/L (ref 98–192)

## 2020-02-26 LAB — HEMOGLOBIN A1C
Hgb A1c MFr Bld: 5.2 % (ref 4.8–5.6)
Mean Plasma Glucose: 102.54 mg/dL

## 2020-02-26 LAB — FERRITIN: Ferritin: 2365 ng/mL — ABNORMAL HIGH (ref 24–336)

## 2020-02-26 MED ORDER — PALONOSETRON HCL INJECTION 0.25 MG/5ML
0.2500 mg | Freq: Once | INTRAVENOUS | Status: AC
  Administered 2020-02-26: 0.25 mg via INTRAVENOUS

## 2020-02-26 MED ORDER — TESTOSTERONE CYPIONATE 200 MG/ML IM SOLN
INTRAMUSCULAR | Status: AC
  Filled 2020-02-26: qty 2

## 2020-02-26 MED ORDER — HYDROMORPHONE HCL 1 MG/ML IJ SOLN
2.0000 mg | Freq: Once | INTRAMUSCULAR | Status: AC
  Administered 2020-02-26: 2 mg via INTRAVENOUS

## 2020-02-26 MED ORDER — SODIUM CHLORIDE 0.9 % IV SOLN
10.0000 mg | Freq: Once | INTRAVENOUS | Status: AC
  Administered 2020-02-26: 10 mg via INTRAVENOUS
  Filled 2020-02-26: qty 10

## 2020-02-26 MED ORDER — PALONOSETRON HCL INJECTION 0.25 MG/5ML
INTRAVENOUS | Status: AC
  Filled 2020-02-26: qty 5

## 2020-02-26 MED ORDER — DEXTROSE 5 % IV SOLN
29.0000 mg/m2 | Freq: Once | INTRAVENOUS | Status: AC
  Administered 2020-02-26: 60 mg via INTRAVENOUS
  Filled 2020-02-26: qty 30

## 2020-02-26 MED ORDER — SODIUM CHLORIDE 0.9 % IV SOLN
Freq: Once | INTRAVENOUS | Status: DC
  Filled 2020-02-26: qty 250

## 2020-02-26 MED ORDER — HEPARIN SOD (PORK) LOCK FLUSH 100 UNIT/ML IV SOLN
500.0000 [IU] | Freq: Once | INTRAVENOUS | Status: AC | PRN
  Administered 2020-02-26: 500 [IU]
  Filled 2020-02-26: qty 5

## 2020-02-26 MED ORDER — TESTOSTERONE CYPIONATE 200 MG/ML IM SOLN
400.0000 mg | Freq: Once | INTRAMUSCULAR | Status: AC
  Administered 2020-02-26: 400 mg via INTRAMUSCULAR

## 2020-02-26 MED ORDER — SODIUM CHLORIDE 0.9 % IV SOLN
300.0000 mg/m2 | Freq: Once | INTRAVENOUS | Status: AC
  Administered 2020-02-26: 620 mg via INTRAVENOUS
  Filled 2020-02-26: qty 31

## 2020-02-26 MED ORDER — HYDROMORPHONE HCL 1 MG/ML IJ SOLN
INTRAMUSCULAR | Status: AC
  Filled 2020-02-26: qty 2

## 2020-02-26 MED ORDER — SODIUM CHLORIDE 0.9% FLUSH
10.0000 mL | INTRAVENOUS | Status: DC | PRN
  Administered 2020-02-26: 10 mL
  Filled 2020-02-26: qty 10

## 2020-02-26 MED ORDER — SODIUM CHLORIDE 0.9 % IV SOLN: 60.0000 mg | Freq: Once | INTRAVENOUS | Status: DC

## 2020-02-26 NOTE — Progress Notes (Signed)
Ok to treat despite counts per MD  

## 2020-02-26 NOTE — Patient Instructions (Signed)

## 2020-02-26 NOTE — Patient Instructions (Signed)
[facility] Discharge Instructions for Patients Receiving Chemotherapy  Today you received the following chemotherapy agents Kyprolis, Cytoxan  To help prevent nausea and vomiting after your treatment, we encourage you to take your nausea medication    If you develop nausea and vomiting that is not controlled by your nausea medication, call the clinic.   BELOW ARE SYMPTOMS THAT SHOULD BE REPORTED IMMEDIATELY:  *FEVER GREATER THAN 100.5 F  *CHILLS WITH OR WITHOUT FEVER  NAUSEA AND VOMITING THAT IS NOT CONTROLLED WITH YOUR NAUSEA MEDICATION  *UNUSUAL SHORTNESS OF BREATH  *UNUSUAL BRUISING OR BLEEDING  TENDERNESS IN MOUTH AND THROAT WITH OR WITHOUT PRESENCE OF ULCERS  *URINARY PROBLEMS  *BOWEL PROBLEMS  UNUSUAL RASH Items with * indicate a potential emergency and should be followed up as soon as possible.  Feel free to call the clinic should you have any questions or concerns. The clinic phone number is [phone].  Please show the CHEMO ALERT CARD at check-in to the Emergency Department and triage nurse.   

## 2020-02-26 NOTE — Progress Notes (Signed)
Hematology and Oncology Follow Up Visit  [name] [id number] [birth year] 70 y.o. 02/26/2020   Principle Diagnosis:  IgG kappa myeloma  Anemia secondary to renal insufficiency  Intermittent iron - deficiency anemia  Hypotestosteronemia  Current Therapy: Aredia 60 mg IV q 3 months-- next dose 03/2020 Aranesp 300 mcg subcu as needed for hemoglobin less than 11 DepoTestosterone400 mg q4weeks IV iron as indicated Kyprolis/Cytoxan - s/p cycle#26   Interim History:  [name] is here today for follow-up and treatment. He had a decent Christmas and New Year's.  His main problem continues to be pain.  He has a lot of pain issues.  I think he has fibromyalgia.  I think he has rheumatologic issues.  I know he is being followed for this.  As far as the myeloma is concerned, he is doing well with this.  There is no monoclonal spike in his blood last time we saw him in December.  I am going to get a 24-hour urine on him so we can see where his light chain level is.  His creatinine has been on the higher side.  I want to make sure that he is not producing a lot of light chains which could be causing problems.  He has had no fever.  He has had no issues with the COVID.  There is been no problems with bleeding.  He has had no diarrhea.  Baby does have diabetes.  He is on insulin for this.  He does get his testosterone injections.  This does help him feel a little bit better.  His iron studies today showed a ferritin of 2300 with an iron saturation of 41%.  A lot of the ferritin elevation is secondary to inflammatory issues.  There is been no leg swelling.  Overall, I would say his performance status is ECOG 2.   Allergies as of 02/26/2020      Reactions   Iodinated Diagnostic Agents Rash   Patient states he was instructed not to take IV contrast.  In 2008 he had an unknown reaction, and was told not to take it again.  He was also told not to take it due to his kidneys.    Iodine Anxiety, Rash, Other (See Comments)   Didn't feel right "instructed not to take per MD--something with his port"      Medication List       Accurate as of February 26, 2020  8:46 AM. If you have any questions, ask your nurse or doctor.        amLODipine 5 MG tablet Commonly known as: NORVASC Take 5 mg by mouth 2 (two) times daily.   amLODipine 10 MG tablet Commonly known as: NORVASC Take by mouth.   atorvastatin 40 MG tablet Commonly known as: LIPITOR Take 40 mg by mouth daily at 6 PM.   Carafate 1 GM/10ML suspension Generic drug: sucralfate Take 1 g by mouth 4 (four) times daily.   dicyclomine 10 MG capsule Commonly known as: BENTYL TAKE 1 CAPSULE (10 MG TOTAL) BY MOUTH 3 (THREE) TIMES DAILY BEFORE MEALS.   dronabinol 2.5 MG capsule Commonly known as: MARINOL Take 2.5 mg by mouth.   gabapentin 300 MG capsule Commonly known as: NEURONTIN TAKE 1 CAPSULE BY MOUTH THREE TIMES A DAY What changed:   how much to take  how to take this  when to take this  additional instructions   glimepiride 2 MG tablet Commonly known as: AMARYL Take 2 mg by mouth daily with  breakfast.   glipiZIDE 5 MG tablet Commonly known as: GLUCOTROL Take 5 mg by mouth.   hydrocortisone 2.5 % cream Apply topically.   lactulose 10 GM/15ML solution Commonly known as: CHRONULAC Take 30 mLs (20 g total) by mouth daily.   latanoprost 0.005 % ophthalmic solution Commonly known as: XALATAN 1 drop at bedtime.   linaclotide 145 MCG Caps capsule Commonly known as: LINZESS Take by mouth.   losartan 50 MG tablet Commonly known as: COZAAR Take 50 mg by mouth daily.   Lumigan 0.01 % Soln Generic drug: bimatoprost SMARTSIG:In Eye(s)   metFORMIN 500 MG tablet Commonly known as: GLUCOPHAGE Take 500 mg by mouth 2 (two) times daily.   metoprolol succinate 25 MG 24 hr tablet Commonly known as: TOPROL-XL Take 25 mg by mouth daily.   mirabegron ER 50 MG Tb24 tablet Commonly known  as: MYRBETRIQ Take 50 mg by mouth daily.   morphine 30 MG 12 hr tablet Commonly known as: MS CONTIN Take 30 mg by mouth 2 (two) times daily.   omeprazole 40 MG capsule Commonly known as: PRILOSEC TAKE 1 CAPSULE BY MOUTH TWICE A DAY   ondansetron 8 MG tablet Commonly known as: ZOFRAN TAKE 1 TABLET(8 MG) BY MOUTH TWICE DAILY AS NEEDED FOR NAUSEA OR VOMITING   oxyCODONE-acetaminophen 10-325 MG tablet Commonly known as: PERCOCET Take 1 tablet by mouth every 6 (six) hours as needed for pain.   pioglitazone 30 MG tablet Commonly known as: ACTOS Take 30 mg by mouth daily.   prochlorperazine 10 MG tablet Commonly known as: COMPAZINE take 1 tablet by mouth every 6 hours if needed for nausea and vomiting   Restasis 0.05 % ophthalmic emulsion Generic drug: cycloSPORINE Place 1 drop into both eyes daily.   SUMAtriptan 100 MG tablet Commonly known as: IMITREX Take 100 mg by mouth daily.   topiramate 25 MG tablet Commonly known as: TOPAMAX Take 25 mg by mouth daily.   torsemide 20 MG tablet Commonly known as: DEMADEX Take 40 mg by mouth daily.   Trulicity 9.76 BH/4.1PF Sopn Generic drug: Dulaglutide Inject 0.75 mg into the skin every Saturday.   Vitamin D (Ergocalciferol) 1.25 MG (50000 UNIT) Caps capsule Commonly known as: DRISDOL Take 50,000 Units by mouth every Saturday.       Allergies:  Allergies  Allergen Reactions  . Iodinated Diagnostic Agents Rash    Patient states he was instructed not to take IV contrast.  In 2008 he had an unknown reaction, and was told not to take it again.  He was also told not to take it due to his kidneys.  . Iodine Anxiety, Rash and Other (See Comments)    Didn't feel right "instructed not to take per MD--something with his port"     Past Medical History, Surgical history, Social history, and Family History were reviewed and updated.  Review of Systems: Review of Systems  Constitutional: Positive for malaise/fatigue.  HENT:  Negative.   Eyes: Negative.   Respiratory: Negative.   Cardiovascular: Negative.   Gastrointestinal: Positive for heartburn.  Genitourinary: Negative.   Musculoskeletal: Positive for joint pain and myalgias.  Skin: Negative.   Neurological: Positive for tingling.  Endo/Heme/Allergies: Negative.   Psychiatric/Behavioral: Negative.      Physical Exam:  weight is 180 lb (81.6 kg). His oral temperature is 98 F (36.7 C). His blood pressure is 166/72 (abnormal) and his pulse is 98. His respiration is 18 and oxygen saturation is 100%.   Wt Readings from Last 3  Encounters:  02/26/20 180 lb (81.6 kg)  01/29/20 192 lb (87.1 kg)  01/01/20 188 lb 0.6 oz (85.3 kg)    Physical Exam Vitals reviewed.  HENT:     Head: Normocephalic and atraumatic.  Eyes:     Pupils: Pupils are equal, round, and reactive to light.  Cardiovascular:     Rate and Rhythm: Normal rate and regular rhythm.     Heart sounds: Normal heart sounds.  Pulmonary:     Effort: Pulmonary effort is normal.     Breath sounds: Normal breath sounds.  Abdominal:     General: Bowel sounds are normal.     Palpations: Abdomen is soft.  Musculoskeletal:        General: No tenderness or deformity. Normal range of motion.     Cervical back: Normal range of motion.  Lymphadenopathy:     Cervical: No cervical adenopathy.  Skin:    General: Skin is warm and dry.     Findings: No erythema or rash.  Neurological:     Mental Status: He is alert and oriented to person, place, and time.  Psychiatric:        Behavior: Behavior normal.        Thought Content: Thought content normal.        Judgment: Judgment normal.      Lab Results  Component Value Date   WBC 7.7 02/26/2020   HGB 10.9 (L) 02/26/2020   HCT 34.0 (L) 02/26/2020   MCV 80.6 02/26/2020   PLT 196 02/26/2020   Lab Results  Component Value Date   FERRITIN 2,385 (H) 01/29/2020   IRON 80 01/29/2020   TIBC 244 01/29/2020   UIBC 164 01/29/2020   IRONPCTSAT 33  01/29/2020   Lab Results  Component Value Date   RETICCTPCT 1.0 02/26/2020   RBC 4.24 02/26/2020   RETICCTABS 27.5 03/21/2014   Lab Results  Component Value Date   KPAFRELGTCHN 47.9 (H) 01/29/2020   LAMBDASER 23.5 01/29/2020   KAPLAMBRATIO 2.04 (H) 01/29/2020   Lab Results  Component Value Date   IGGSERUM 1,109 01/29/2020   IGA 221 01/29/2020   IGMSERUM 35 01/29/2020   Lab Results  Component Value Date   TOTALPROTELP 7.4 01/29/2020   ALBUMINELP 3.9 01/29/2020   A1GS 0.2 01/29/2020   A2GS 1.1 (H) 01/29/2020   BETS 1.0 01/29/2020   BETA2SER 0.5 11/07/2014   GAMS 1.1 01/29/2020   MSPIKE Not Observed 01/29/2020   SPEI Comment 01/29/2020     Chemistry      Component Value Date/Time   NA 141 01/29/2020 0849   NA 143 02/22/2017 0744   K 3.7 01/29/2020 0849   K 4.0 02/22/2017 0744   CL 100 01/29/2020 0849   CL 106 02/22/2017 0744   CO2 33 (H) 01/29/2020 0849   CO2 27 02/22/2017 0744   BUN 29 (H) 01/29/2020 0849   BUN 26 (H) 02/22/2017 0744   CREATININE 2.41 (H) 01/29/2020 0849   CREATININE 2.3 (H) 02/22/2017 0744      Component Value Date/Time   CALCIUM 9.6 01/29/2020 0849   CALCIUM 9.3 02/22/2017 0744   ALKPHOS 93 01/29/2020 0849   ALKPHOS 80 02/22/2017 0744   AST 22 01/29/2020 0849   ALT 15 01/29/2020 0849   ALT 22 02/22/2017 0744   BILITOT 0.4 01/29/2020 0849       Impression and Plan: Mr. Brahm is a very pleasant 70 yo African American gentleman with IgG kappa myeloma.  We will go ahead with  chemotherapy today.  The treatment is certainly do seem to be helping.  Again, his myeloma really has not been much of a problem.  We are certainly not treating him on a regular basis which is nice for him.  His main problem has been arthritic.  Her diabetes also a problem.  I just do not think that he will ever have a problem with the myeloma.  We will plan to get him back in another 4 weeks.  This seems to be a good interval.  We will see what the 24-hour urine  shows.   Volanda Napoleon, MD 1/3/20228:46 AM

## 2020-02-27 ENCOUNTER — Telehealth: Payer: Self-pay

## 2020-02-27 LAB — PROTEIN ELECTROPHORESIS, SERUM, WITH REFLEX
A/G Ratio: 1.1 (ref 0.7–1.7)
Albumin ELP: 3.9 g/dL (ref 2.9–4.4)
Alpha-1-Globulin: 0.2 g/dL (ref 0.0–0.4)
Alpha-2-Globulin: 1.1 g/dL — ABNORMAL HIGH (ref 0.4–1.0)
Beta Globulin: 1 g/dL (ref 0.7–1.3)
Gamma Globulin: 1.3 g/dL (ref 0.4–1.8)
Globulin, Total: 3.7 g/dL (ref 2.2–3.9)
Total Protein ELP: 7.6 g/dL (ref 6.0–8.5)

## 2020-02-27 LAB — KAPPA/LAMBDA LIGHT CHAINS
Kappa free light chain: 48.6 mg/L — ABNORMAL HIGH (ref 3.3–19.4)
Kappa, lambda light chain ratio: 1.78 — ABNORMAL HIGH (ref 0.26–1.65)
Lambda free light chains: 27.3 mg/L — ABNORMAL HIGH (ref 5.7–26.3)

## 2020-02-27 LAB — IGG, IGA, IGM
IgA: 280 mg/dL (ref 61–437)
IgG (Immunoglobin G), Serum: 1340 mg/dL (ref 603–1613)
IgM (Immunoglobulin M), Srm: 38 mg/dL (ref 20–172)

## 2020-02-27 LAB — TESTOSTERONE: Testosterone: 200 ng/dL — ABNORMAL LOW (ref 264–916)

## 2020-02-27 NOTE — Telephone Encounter (Signed)
appts moved from 03/18/20 to 03/26/20 as 1/24 too soon, per 02/26/20 los   aom

## 2020-02-29 ENCOUNTER — Inpatient Hospital Stay: Payer: 59

## 2020-02-29 ENCOUNTER — Other Ambulatory Visit: Payer: Self-pay

## 2020-02-29 DIAGNOSIS — C9000 Multiple myeloma not having achieved remission: Secondary | ICD-10-CM

## 2020-02-29 DIAGNOSIS — Z5112 Encounter for antineoplastic immunotherapy: Secondary | ICD-10-CM | POA: Diagnosis not present

## 2020-03-04 LAB — UPEP/UIFE/LIGHT CHAINS/TP, 24-HR UR
% BETA, Urine: 15.3 %
ALPHA 1 URINE: 4.2 %
Albumin, U: 64.1 %
Alpha 2, Urine: 7.2 %
Free Kappa Lt Chains,Ur: 105.06 mg/L (ref 0.63–113.79)
Free Kappa/Lambda Ratio: 7.79 (ref 1.03–31.76)
Free Lambda Lt Chains,Ur: 13.48 mg/L — ABNORMAL HIGH (ref 0.47–11.77)
GAMMA GLOBULIN URINE: 9.2 %
Total Protein, Urine-Ur/day: 559 mg/24 hr — ABNORMAL HIGH (ref 30–150)
Total Protein, Urine: 24.3 mg/dL
Total Volume: 2300

## 2020-03-05 ENCOUNTER — Telehealth: Payer: Self-pay | Admitting: *Deleted

## 2020-03-05 NOTE — Telephone Encounter (Signed)
As noted below by Dr. [name], I informed the patient that the light chains in the urine are down by 50%. He verbalized understanding.

## 2020-03-05 NOTE — Telephone Encounter (Signed)
-----   Message from Volanda Napoleon, MD sent at 03/04/2020  6:03 PM EST ----- Call - the light chains in the urine are down by 50%.  Great job!!  [employer] limp into the [employer]!!  [employer]

## 2020-03-18 ENCOUNTER — Other Ambulatory Visit: Payer: 59

## 2020-03-18 ENCOUNTER — Ambulatory Visit: Payer: 59

## 2020-03-18 ENCOUNTER — Ambulatory Visit: Payer: 59 | Admitting: Hematology & Oncology

## 2020-03-26 ENCOUNTER — Encounter: Payer: Self-pay | Admitting: Hematology & Oncology

## 2020-03-26 ENCOUNTER — Inpatient Hospital Stay: Payer: 59

## 2020-03-26 ENCOUNTER — Inpatient Hospital Stay (HOSPITAL_BASED_OUTPATIENT_CLINIC_OR_DEPARTMENT_OTHER): Payer: 59 | Admitting: Hematology & Oncology

## 2020-03-26 ENCOUNTER — Inpatient Hospital Stay: Payer: 59 | Attending: Hematology & Oncology

## 2020-03-26 ENCOUNTER — Other Ambulatory Visit: Payer: Self-pay

## 2020-03-26 VITALS — BP 136/66 | HR 83 | Temp 98.2°F | Resp 18 | Wt 180.0 lb

## 2020-03-26 DIAGNOSIS — Z299 Encounter for prophylactic measures, unspecified: Secondary | ICD-10-CM

## 2020-03-26 DIAGNOSIS — E349 Endocrine disorder, unspecified: Secondary | ICD-10-CM

## 2020-03-26 DIAGNOSIS — Z5111 Encounter for antineoplastic chemotherapy: Secondary | ICD-10-CM | POA: Insufficient documentation

## 2020-03-26 DIAGNOSIS — C9000 Multiple myeloma not having achieved remission: Secondary | ICD-10-CM | POA: Insufficient documentation

## 2020-03-26 DIAGNOSIS — N189 Chronic kidney disease, unspecified: Secondary | ICD-10-CM | POA: Diagnosis not present

## 2020-03-26 DIAGNOSIS — D631 Anemia in chronic kidney disease: Secondary | ICD-10-CM

## 2020-03-26 DIAGNOSIS — C9001 Multiple myeloma in remission: Secondary | ICD-10-CM

## 2020-03-26 DIAGNOSIS — Z5112 Encounter for antineoplastic immunotherapy: Secondary | ICD-10-CM | POA: Insufficient documentation

## 2020-03-26 DIAGNOSIS — M4712 Other spondylosis with myelopathy, cervical region: Secondary | ICD-10-CM

## 2020-03-26 DIAGNOSIS — M797 Fibromyalgia: Secondary | ICD-10-CM | POA: Insufficient documentation

## 2020-03-26 LAB — CBC WITH DIFFERENTIAL (CANCER CENTER ONLY)
Abs Immature Granulocytes: 0.01 10*3/uL (ref 0.00–0.07)
Basophils Absolute: 0 10*3/uL (ref 0.0–0.1)
Basophils Relative: 1 %
Eosinophils Absolute: 0.1 10*3/uL (ref 0.0–0.5)
Eosinophils Relative: 2 %
HCT: 32.8 % — ABNORMAL LOW (ref 39.0–52.0)
Hemoglobin: 10.6 g/dL — ABNORMAL LOW (ref 13.0–17.0)
Immature Granulocytes: 0 %
Lymphocytes Relative: 20 %
Lymphs Abs: 0.8 10*3/uL (ref 0.7–4.0)
MCH: 25.8 pg — ABNORMAL LOW (ref 26.0–34.0)
MCHC: 32.3 g/dL (ref 30.0–36.0)
MCV: 79.8 fL — ABNORMAL LOW (ref 80.0–100.0)
Monocytes Absolute: 0.4 10*3/uL (ref 0.1–1.0)
Monocytes Relative: 11 %
Neutro Abs: 2.7 10*3/uL (ref 1.7–7.7)
Neutrophils Relative %: 66 %
Platelet Count: 157 10*3/uL (ref 150–400)
RBC: 4.11 MIL/uL — ABNORMAL LOW (ref 4.22–5.81)
RDW: 15.9 % — ABNORMAL HIGH (ref 11.5–15.5)
WBC Count: 4.1 10*3/uL (ref 4.0–10.5)
nRBC: 0 % (ref 0.0–0.2)

## 2020-03-26 LAB — FERRITIN: Ferritin: 2082 ng/mL — ABNORMAL HIGH (ref 24–336)

## 2020-03-26 LAB — IRON AND TIBC
Iron: 70 ug/dL (ref 42–163)
Saturation Ratios: 30 % (ref 20–55)
TIBC: 234 ug/dL (ref 202–409)
UIBC: 164 ug/dL (ref 117–376)

## 2020-03-26 LAB — CMP (CANCER CENTER ONLY)
ALT: 12 U/L (ref 0–44)
AST: 18 U/L (ref 15–41)
Albumin: 4.3 g/dL (ref 3.5–5.0)
Alkaline Phosphatase: 70 U/L (ref 38–126)
Anion gap: 9 (ref 5–15)
BUN: 26 mg/dL — ABNORMAL HIGH (ref 8–23)
CO2: 31 mmol/L (ref 22–32)
Calcium: 9.3 mg/dL (ref 8.9–10.3)
Chloride: 98 mmol/L (ref 98–111)
Creatinine: 2.56 mg/dL — ABNORMAL HIGH (ref 0.61–1.24)
GFR, Estimated: 26 mL/min — ABNORMAL LOW (ref >60)
Glucose, Bld: 106 mg/dL — ABNORMAL HIGH (ref 70–99)
Potassium: 3.4 mmol/L — ABNORMAL LOW (ref 3.5–5.1)
Sodium: 138 mmol/L (ref 135–145)
Total Bilirubin: 0.5 mg/dL (ref 0.3–1.2)
Total Protein: 7.2 g/dL (ref 6.5–8.1)

## 2020-03-26 LAB — LACTATE DEHYDROGENASE: LDH: 177 U/L (ref 98–192)

## 2020-03-26 MED ORDER — TESTOSTERONE CYPIONATE 200 MG/ML IM SOLN
INTRAMUSCULAR | Status: AC
  Filled 2020-03-26: qty 2

## 2020-03-26 MED ORDER — SODIUM CHLORIDE 0.9 % IV SOLN: 60.0000 mg | Freq: Once | INTRAVENOUS | Status: DC

## 2020-03-26 MED ORDER — HEPARIN SOD (PORK) LOCK FLUSH 100 UNIT/ML IV SOLN
500.0000 [IU] | Freq: Once | INTRAVENOUS | Status: AC | PRN
  Administered 2020-03-26: 500 [IU]
  Filled 2020-03-26: qty 5

## 2020-03-26 MED ORDER — HYDROMORPHONE HCL 1 MG/ML IJ SOLN
2.0000 mg | Freq: Once | INTRAMUSCULAR | Status: AC
Start: 2020-03-26 — End: 2020-03-26
  Administered 2020-03-26: 2 mg via INTRAVENOUS

## 2020-03-26 MED ORDER — HYDROMORPHONE HCL 1 MG/ML IJ SOLN
INTRAMUSCULAR | Status: AC
  Filled 2020-03-26: qty 2

## 2020-03-26 MED ORDER — TESTOSTERONE CYPIONATE 200 MG/ML IM SOLN
400.0000 mg | Freq: Once | INTRAMUSCULAR | Status: AC
  Administered 2020-03-26: 400 mg via INTRAMUSCULAR

## 2020-03-26 MED ORDER — SODIUM CHLORIDE 0.9 % IV SOLN
Freq: Once | INTRAVENOUS | Status: AC
  Filled 2020-03-26: qty 250

## 2020-03-26 MED ORDER — PALONOSETRON HCL INJECTION 0.25 MG/5ML
INTRAVENOUS | Status: AC
  Filled 2020-03-26: qty 5

## 2020-03-26 MED ORDER — SODIUM CHLORIDE 0.9 % IV SOLN
10.0000 mg | Freq: Once | INTRAVENOUS | Status: AC
  Administered 2020-03-26: 10 mg via INTRAVENOUS
  Filled 2020-03-26: qty 10

## 2020-03-26 MED ORDER — DEXTROSE 5 % IV SOLN
29.0000 mg/m2 | Freq: Once | INTRAVENOUS | Status: AC
  Administered 2020-03-26: 60 mg via INTRAVENOUS
  Filled 2020-03-26: qty 30

## 2020-03-26 MED ORDER — SODIUM CHLORIDE 0.9% FLUSH
10.0000 mL | INTRAVENOUS | Status: DC | PRN
  Administered 2020-03-26: 10 mL
  Filled 2020-03-26: qty 10

## 2020-03-26 MED ORDER — TESTOSTERONE CYPIONATE 200 MG/ML IM SOLN
INTRAMUSCULAR | Status: AC
  Filled 2020-03-26: qty 1

## 2020-03-26 MED ORDER — SODIUM CHLORIDE 0.9 % IV SOLN
300.0000 mg/m2 | Freq: Once | INTRAVENOUS | Status: AC
  Administered 2020-03-26: 620 mg via INTRAVENOUS
  Filled 2020-03-26: qty 31

## 2020-03-26 MED ORDER — PALONOSETRON HCL INJECTION 0.25 MG/5ML
0.2500 mg | Freq: Once | INTRAVENOUS | Status: AC
  Administered 2020-03-26: 0.25 mg via INTRAVENOUS

## 2020-03-26 NOTE — Progress Notes (Signed)
OK to treat with the serum creatinine level today per Dr. [name].

## 2020-03-26 NOTE — Patient Instructions (Signed)
[facility] Discharge Instructions for Patients Receiving Chemotherapy  Today you received the following chemotherapy agents Kyprolis, Cytoxan.  To help prevent nausea and vomiting after your treatment, we encourage you to take your nausea medication as indicated by your MD.   If you develop nausea and vomiting that is not controlled by your nausea medication, call the clinic.   BELOW ARE SYMPTOMS THAT SHOULD BE REPORTED IMMEDIATELY:  *FEVER GREATER THAN 100.5 F  *CHILLS WITH OR WITHOUT FEVER  NAUSEA AND VOMITING THAT IS NOT CONTROLLED WITH YOUR NAUSEA MEDICATION  *UNUSUAL SHORTNESS OF BREATH  *UNUSUAL BRUISING OR BLEEDING  TENDERNESS IN MOUTH AND THROAT WITH OR WITHOUT PRESENCE OF ULCERS  *URINARY PROBLEMS  *BOWEL PROBLEMS  UNUSUAL RASH Items with * indicate a potential emergency and should be followed up as soon as possible.  Feel free to call the clinic should you have any questions or concerns. The clinic phone number is [phone].  Please show the [place] at check-in to the Emergency Department and triage nurse.

## 2020-03-26 NOTE — Progress Notes (Signed)
Hematology and Oncology Follow Up Visit  [name] [id number] 06-16-1950 70 y.o. 03/26/2020   Principle Diagnosis:  IgG kappa myeloma  Anemia secondary to renal insufficiency  Intermittent iron - deficiency anemia  Hypotestosteronemia  Current Therapy: Aredia 60 mg IV q 3 months-- next dose 06/2020 Aranesp 300 mcg subcu as needed for hemoglobin less than 11 DepoTestosterone400 mg q4weeks IV iron as indicated Kyprolis/Cytoxan - s/p cycle#27   Interim History:  Mr. Daily is here today for follow-up and treatment.  Overall, he is doing okay.  He says he has some hot flashes.  I do not know if this is from testosterone.  Overall his testosterone level has been okay.    As far as the myeloma is concerned, this has been doing quite well.  Back in early January, he had no monoclonal spike in his blood.  His IgG G level was 1340 mg/dL.  His Kappa light chain was 4.9 mg/dL.  We did do a 24-hour urine on him.  This was done on 02/29/2020.  He only had 105 mg/day of light chain in the urine.  This is much better than when we last checked it, which was about 3 years ago.  His iron levels have been okay.  He does have issues with fibromyalgia.  He is on pain medication.  His blood sugars have been under better control.  Overall, his performance status is ECOG 1.    Allergies as of 03/26/2020      Reactions   Iodinated Diagnostic Agents Rash   Patient states he was instructed not to take IV contrast.  In 2008 he had an unknown reaction, and was told not to take it again.  He was also told not to take it due to his kidneys.   Iodine Anxiety, Rash, Other (See Comments)   Didn't feel right "instructed not to take per MD--something with his port"      Medication List       Accurate as of March 26, 2020  9:05 AM. If you have any questions, ask your nurse or doctor.        amLODipine 10 MG tablet Commonly known as: NORVASC Take 10 mg by mouth daily. What changed:  Another medication with the same name was removed. Continue taking this medication, and follow the directions you see here. Changed by: Volanda Napoleon, MD   atorvastatin 40 MG tablet Commonly known as: LIPITOR Take 40 mg by mouth daily at 6 PM.   Carafate 1 GM/10ML suspension Generic drug: sucralfate Take 1 g by mouth 4 (four) times daily.   [employer] Fiber Gummies 2 g Chew Chew by mouth.   dicyclomine 10 MG capsule Commonly known as: BENTYL TAKE 1 CAPSULE (10 MG TOTAL) BY MOUTH 3 (THREE) TIMES DAILY BEFORE MEALS.   dronabinol 2.5 MG capsule Commonly known as: MARINOL Take 2.5 mg by mouth.   gabapentin 300 MG capsule Commonly known as: NEURONTIN TAKE 1 CAPSULE BY MOUTH THREE TIMES A DAY What changed:   how much to take  how to take this  when to take this  additional instructions   glimepiride 2 MG tablet Commonly known as: AMARYL Take 2 mg by mouth daily with breakfast.   glipiZIDE 5 MG tablet Commonly known as: GLUCOTROL Take 5 mg by mouth.   hydrocortisone 2.5 % cream Apply topically.   lactulose 10 GM/15ML solution Commonly known as: CHRONULAC Take 30 mLs (20 g total) by mouth daily.   latanoprost 0.005 % ophthalmic solution  Commonly known as: XALATAN 1 drop at bedtime.   linaclotide 145 MCG Caps capsule Commonly known as: LINZESS Take by mouth.   losartan 50 MG tablet Commonly known as: COZAAR Take 50 mg by mouth daily.   Lumigan 0.01 % Soln Generic drug: bimatoprost SMARTSIG:In Eye(s)   metFORMIN 500 MG tablet Commonly known as: GLUCOPHAGE Take 500 mg by mouth 2 (two) times daily.   metoprolol succinate 25 MG 24 hr tablet Commonly known as: TOPROL-XL Take 25 mg by mouth daily.   mirabegron ER 50 MG Tb24 tablet Commonly known as: MYRBETRIQ Take 50 mg by mouth daily.   morphine 30 MG 12 hr tablet Commonly known as: MS CONTIN Take 30 mg by mouth 2 (two) times daily.   omeprazole 40 MG capsule Commonly known as: PRILOSEC TAKE 1 CAPSULE  BY MOUTH TWICE A DAY   ondansetron 8 MG tablet Commonly known as: ZOFRAN TAKE 1 TABLET(8 MG) BY MOUTH TWICE DAILY AS NEEDED FOR NAUSEA OR VOMITING   oxyCODONE-acetaminophen 10-325 MG tablet Commonly known as: PERCOCET Take 1 tablet by mouth every 6 (six) hours as needed for pain.   pioglitazone 30 MG tablet Commonly known as: ACTOS Take 30 mg by mouth daily.   prochlorperazine 10 MG tablet Commonly known as: COMPAZINE take 1 tablet by mouth every 6 hours if needed for nausea and vomiting   Restasis 0.05 % ophthalmic emulsion Generic drug: cycloSPORINE Place 1 drop into both eyes daily.   SUMAtriptan 100 MG tablet Commonly known as: IMITREX Take 100 mg by mouth daily.   topiramate 25 MG capsule Commonly known as: TOPAMAX Take 25 mg by mouth 2 (two) times daily.   topiramate 25 MG tablet Commonly known as: TOPAMAX Take 25 mg by mouth daily.   torsemide 20 MG tablet Commonly known as: DEMADEX Take 40 mg by mouth daily.   Trulicity 0.35 WS/5.6CL Sopn Generic drug: Dulaglutide Inject 0.75 mg into the skin every Saturday.   Vitamin D (Ergocalciferol) 1.25 MG (50000 UNIT) Caps capsule Commonly known as: DRISDOL Take 50,000 Units by mouth every Saturday.       Allergies:  Allergies  Allergen Reactions  . Iodinated Diagnostic Agents Rash    Patient states he was instructed not to take IV contrast.  In 2008 he had an unknown reaction, and was told not to take it again.  He was also told not to take it due to his kidneys.  . Iodine Anxiety, Rash and Other (See Comments)    Didn't feel right "instructed not to take per MD--something with his port"     Past Medical History, Surgical history, Social history, and Family History were reviewed and updated.  Review of Systems: Review of Systems  Constitutional: Positive for malaise/fatigue.  HENT: Negative.   Eyes: Negative.   Respiratory: Negative.   Cardiovascular: Negative.   Gastrointestinal: Positive for  heartburn.  Genitourinary: Negative.   Musculoskeletal: Positive for joint pain and myalgias.  Skin: Negative.   Neurological: Positive for tingling.  Endo/Heme/Allergies: Negative.   Psychiatric/Behavioral: Negative.      Physical Exam:  weight is 180 lb (81.6 kg). His temperature is 98.2 F (36.8 C). His blood pressure is 136/66 and his pulse is 83. His respiration is 18 and oxygen saturation is 100%.   Wt Readings from Last 3 Encounters:  03/26/20 180 lb (81.6 kg)  02/26/20 180 lb (81.6 kg)  01/29/20 192 lb (87.1 kg)    Physical Exam Vitals reviewed.  HENT:     Head:  Normocephalic and atraumatic.  Eyes:     Pupils: Pupils are equal, round, and reactive to light.  Cardiovascular:     Rate and Rhythm: Normal rate and regular rhythm.     Heart sounds: Normal heart sounds.  Pulmonary:     Effort: Pulmonary effort is normal.     Breath sounds: Normal breath sounds.  Abdominal:     General: Bowel sounds are normal.     Palpations: Abdomen is soft.  Musculoskeletal:        General: No tenderness or deformity. Normal range of motion.     Cervical back: Normal range of motion.  Lymphadenopathy:     Cervical: No cervical adenopathy.  Skin:    General: Skin is warm and dry.     Findings: No erythema or rash.  Neurological:     Mental Status: He is alert and oriented to person, place, and time.  Psychiatric:        Behavior: Behavior normal.        Thought Content: Thought content normal.        Judgment: Judgment normal.      Lab Results  Component Value Date   WBC 4.1 03/26/2020   HGB 10.6 (L) 03/26/2020   HCT 32.8 (L) 03/26/2020   MCV 79.8 (L) 03/26/2020   PLT 157 03/26/2020   Lab Results  Component Value Date   FERRITIN 2,365 (H) 02/26/2020   IRON 101 02/26/2020   TIBC 244 02/26/2020   UIBC 143 02/26/2020   IRONPCTSAT 41 02/26/2020   Lab Results  Component Value Date   RETICCTPCT 1.0 02/26/2020   RBC 4.11 (L) 03/26/2020   RETICCTABS 27.5 03/21/2014    Lab Results  Component Value Date   KPAFRELGTCHN 48.6 (H) 02/26/2020   LAMBDASER 27.3 (H) 02/26/2020   KAPLAMBRATIO 7.79 02/29/2020   Lab Results  Component Value Date   IGGSERUM 1,340 02/26/2020   IGA 280 02/26/2020   IGMSERUM 38 02/26/2020   Lab Results  Component Value Date   TOTALPROTELP 7.6 02/26/2020   ALBUMINELP 3.9 02/26/2020   A1GS 0.2 02/26/2020   A2GS 1.1 (H) 02/26/2020   BETS 1.0 02/26/2020   BETA2SER 0.5 11/07/2014   GAMS 1.3 02/26/2020   MSPIKE Not Observed 02/26/2020   SPEI Comment 01/29/2020     Chemistry      Component Value Date/Time   NA 138 03/26/2020 0815   NA 143 02/22/2017 0744   K 3.4 (L) 03/26/2020 0815   K 4.0 02/22/2017 0744   CL 98 03/26/2020 0815   CL 106 02/22/2017 0744   CO2 31 03/26/2020 0815   CO2 27 02/22/2017 0744   BUN 26 (H) 03/26/2020 0815   BUN 26 (H) 02/22/2017 0744   CREATININE 2.56 (H) 03/26/2020 0815   CREATININE 2.3 (H) 02/22/2017 0744      Component Value Date/Time   CALCIUM 9.3 03/26/2020 0815   CALCIUM 9.3 02/22/2017 0744   ALKPHOS 70 03/26/2020 0815   ALKPHOS 80 02/22/2017 0744   AST 18 03/26/2020 0815   ALT 12 03/26/2020 0815   ALT 22 02/22/2017 0744   BILITOT 0.5 03/26/2020 0815       Impression and Plan: Mr. Marlatt is a very pleasant 70 yo African American gentleman with IgG kappa myeloma.  We will go ahead with chemotherapy today.  We will hold the radio because of his renal function.  I am sure his renal function is reflective of his diabetes.   We did give him Aranesp.  Again, this is all about quality of life.  We are doing a good job with respect to the myeloma.  Again he has other health issues which he has other doctors that are doing a fantastic job with.  We will continue to follow him along every 4 weeks.    Volanda Napoleon, MD 2/1/20229:05 AM

## 2020-03-27 LAB — PROTEIN ELECTROPHORESIS, SERUM, WITH REFLEX
A/G Ratio: 1.2 (ref 0.7–1.7)
Albumin ELP: 3.9 g/dL (ref 2.9–4.4)
Alpha-1-Globulin: 0.2 g/dL (ref 0.0–0.4)
Alpha-2-Globulin: 1 g/dL (ref 0.4–1.0)
Beta Globulin: 0.9 g/dL (ref 0.7–1.3)
Gamma Globulin: 1.2 g/dL (ref 0.4–1.8)
Globulin, Total: 3.2 g/dL (ref 2.2–3.9)
Total Protein ELP: 7.1 g/dL (ref 6.0–8.5)

## 2020-03-27 LAB — IGG, IGA, IGM
IgA: 272 mg/dL (ref 61–437)
IgG (Immunoglobin G), Serum: 1166 mg/dL (ref 603–1613)
IgM (Immunoglobulin M), Srm: 29 mg/dL (ref 20–172)

## 2020-03-27 LAB — KAPPA/LAMBDA LIGHT CHAINS
Kappa free light chain: 40.4 mg/L — ABNORMAL HIGH (ref 3.3–19.4)
Kappa, lambda light chain ratio: 1.76 — ABNORMAL HIGH (ref 0.26–1.65)
Lambda free light chains: 23 mg/L (ref 5.7–26.3)

## 2020-03-27 LAB — TESTOSTERONE: Testosterone: 319 ng/dL (ref 264–916)

## 2020-04-10 ENCOUNTER — Other Ambulatory Visit: Payer: Self-pay | Admitting: Orthopedic Surgery

## 2020-04-23 ENCOUNTER — Inpatient Hospital Stay: Payer: 59 | Attending: Hematology & Oncology

## 2020-04-23 ENCOUNTER — Inpatient Hospital Stay: Payer: 59

## 2020-04-23 ENCOUNTER — Inpatient Hospital Stay (HOSPITAL_BASED_OUTPATIENT_CLINIC_OR_DEPARTMENT_OTHER): Payer: 59 | Admitting: Family

## 2020-04-23 ENCOUNTER — Other Ambulatory Visit: Payer: Self-pay

## 2020-04-23 ENCOUNTER — Encounter: Payer: Self-pay | Admitting: Family

## 2020-04-23 VITALS — BP 144/64 | HR 90 | Temp 97.9°F | Resp 17 | Ht 71.0 in | Wt 189.0 lb

## 2020-04-23 DIAGNOSIS — D500 Iron deficiency anemia secondary to blood loss (chronic): Secondary | ICD-10-CM | POA: Diagnosis not present

## 2020-04-23 DIAGNOSIS — C9001 Multiple myeloma in remission: Secondary | ICD-10-CM

## 2020-04-23 DIAGNOSIS — E349 Endocrine disorder, unspecified: Secondary | ICD-10-CM

## 2020-04-23 DIAGNOSIS — Z5112 Encounter for antineoplastic immunotherapy: Secondary | ICD-10-CM | POA: Diagnosis present

## 2020-04-23 DIAGNOSIS — M797 Fibromyalgia: Secondary | ICD-10-CM | POA: Diagnosis not present

## 2020-04-23 DIAGNOSIS — D631 Anemia in chronic kidney disease: Secondary | ICD-10-CM

## 2020-04-23 DIAGNOSIS — Z95828 Presence of other vascular implants and grafts: Secondary | ICD-10-CM | POA: Diagnosis not present

## 2020-04-23 DIAGNOSIS — Z5111 Encounter for antineoplastic chemotherapy: Secondary | ICD-10-CM | POA: Insufficient documentation

## 2020-04-23 DIAGNOSIS — E291 Testicular hypofunction: Secondary | ICD-10-CM | POA: Insufficient documentation

## 2020-04-23 DIAGNOSIS — D509 Iron deficiency anemia, unspecified: Secondary | ICD-10-CM | POA: Insufficient documentation

## 2020-04-23 DIAGNOSIS — Z299 Encounter for prophylactic measures, unspecified: Secondary | ICD-10-CM

## 2020-04-23 DIAGNOSIS — C9000 Multiple myeloma not having achieved remission: Secondary | ICD-10-CM

## 2020-04-23 DIAGNOSIS — E119 Type 2 diabetes mellitus without complications: Secondary | ICD-10-CM | POA: Diagnosis not present

## 2020-04-23 LAB — CMP (CANCER CENTER ONLY)
ALT: 13 U/L (ref 0–44)
AST: 21 U/L (ref 15–41)
Albumin: 4.4 g/dL (ref 3.5–5.0)
Alkaline Phosphatase: 85 U/L (ref 38–126)
Anion gap: 7 (ref 5–15)
BUN: 25 mg/dL — ABNORMAL HIGH (ref 8–23)
CO2: 31 mmol/L (ref 22–32)
Calcium: 9.7 mg/dL (ref 8.9–10.3)
Chloride: 100 mmol/L (ref 98–111)
Creatinine: 2.57 mg/dL — ABNORMAL HIGH (ref 0.61–1.24)
GFR, Estimated: 26 mL/min — ABNORMAL LOW (ref >60)
Glucose, Bld: 107 mg/dL — ABNORMAL HIGH (ref 70–99)
Potassium: 3.6 mmol/L (ref 3.5–5.1)
Sodium: 138 mmol/L (ref 135–145)
Total Bilirubin: 0.4 mg/dL (ref 0.3–1.2)
Total Protein: 7.3 g/dL (ref 6.5–8.1)

## 2020-04-23 LAB — CBC WITH DIFFERENTIAL (CANCER CENTER ONLY)
Abs Immature Granulocytes: 0.02 10*3/uL (ref 0.00–0.07)
Basophils Absolute: 0.1 10*3/uL (ref 0.0–0.1)
Basophils Relative: 2 %
Eosinophils Absolute: 0.1 10*3/uL (ref 0.0–0.5)
Eosinophils Relative: 3 %
HCT: 32.9 % — ABNORMAL LOW (ref 39.0–52.0)
Hemoglobin: 10.7 g/dL — ABNORMAL LOW (ref 13.0–17.0)
Immature Granulocytes: 0 %
Lymphocytes Relative: 17 %
Lymphs Abs: 0.8 10*3/uL (ref 0.7–4.0)
MCH: 25.6 pg — ABNORMAL LOW (ref 26.0–34.0)
MCHC: 32.5 g/dL (ref 30.0–36.0)
MCV: 78.7 fL — ABNORMAL LOW (ref 80.0–100.0)
Monocytes Absolute: 0.6 10*3/uL (ref 0.1–1.0)
Monocytes Relative: 12 %
Neutro Abs: 3.1 10*3/uL (ref 1.7–7.7)
Neutrophils Relative %: 66 %
Platelet Count: 196 10*3/uL (ref 150–400)
RBC: 4.18 MIL/uL — ABNORMAL LOW (ref 4.22–5.81)
RDW: 16 % — ABNORMAL HIGH (ref 11.5–15.5)
WBC Count: 4.7 10*3/uL (ref 4.0–10.5)
nRBC: 0 % (ref 0.0–0.2)

## 2020-04-23 LAB — IRON AND TIBC
Iron: 83 ug/dL (ref 42–163)
Saturation Ratios: 33 % (ref 20–55)
TIBC: 253 ug/dL (ref 202–409)
UIBC: 170 ug/dL (ref 117–376)

## 2020-04-23 LAB — FERRITIN: Ferritin: 1995 ng/mL — ABNORMAL HIGH (ref 24–336)

## 2020-04-23 LAB — LACTATE DEHYDROGENASE: LDH: 190 U/L (ref 98–192)

## 2020-04-23 MED ORDER — HYDROMORPHONE HCL 1 MG/ML IJ SOLN
2.0000 mg | Freq: Once | INTRAMUSCULAR | Status: AC
Start: 2020-04-23 — End: 2020-04-23
  Administered 2020-04-23: 2 mg via INTRAVENOUS

## 2020-04-23 MED ORDER — TESTOSTERONE CYPIONATE 200 MG/ML IM SOLN
INTRAMUSCULAR | Status: AC
  Filled 2020-04-23: qty 2

## 2020-04-23 MED ORDER — SODIUM CHLORIDE 0.9 % IV SOLN
10.0000 mg | Freq: Once | INTRAVENOUS | Status: AC
  Administered 2020-04-23: 10 mg via INTRAVENOUS
  Filled 2020-04-23: qty 10

## 2020-04-23 MED ORDER — SODIUM CHLORIDE 0.9 % IV SOLN
Freq: Once | INTRAVENOUS | Status: DC
  Filled 2020-04-23: qty 250

## 2020-04-23 MED ORDER — HYDROMORPHONE HCL 1 MG/ML IJ SOLN
INTRAMUSCULAR | Status: AC
  Filled 2020-04-23: qty 2

## 2020-04-23 MED ORDER — SODIUM CHLORIDE 0.9% FLUSH
3.0000 mL | INTRAVENOUS | Status: DC | PRN
  Filled 2020-04-23: qty 10

## 2020-04-23 MED ORDER — SODIUM CHLORIDE 0.9 % IV SOLN
300.0000 mg/m2 | Freq: Once | INTRAVENOUS | Status: AC
  Administered 2020-04-23: 620 mg via INTRAVENOUS
  Filled 2020-04-23: qty 31

## 2020-04-23 MED ORDER — TESTOSTERONE CYPIONATE 200 MG/ML IM SOLN
400.0000 mg | Freq: Once | INTRAMUSCULAR | Status: AC
  Administered 2020-04-23: 400 mg via INTRAMUSCULAR

## 2020-04-23 MED ORDER — SODIUM CHLORIDE 0.9 % IV SOLN
Freq: Once | INTRAVENOUS | Status: AC
  Filled 2020-04-23: qty 250

## 2020-04-23 MED ORDER — HEPARIN SOD (PORK) LOCK FLUSH 100 UNIT/ML IV SOLN
500.0000 [IU] | Freq: Once | INTRAVENOUS | Status: AC | PRN
  Administered 2020-04-23: 500 [IU]
  Filled 2020-04-23: qty 5

## 2020-04-23 MED ORDER — DEXTROSE 5 % IV SOLN
28.6000 mg/m2 | Freq: Once | INTRAVENOUS | Status: AC
  Administered 2020-04-23: 60 mg via INTRAVENOUS
  Filled 2020-04-23: qty 30

## 2020-04-23 MED ORDER — PALONOSETRON HCL INJECTION 0.25 MG/5ML
INTRAVENOUS | Status: AC
  Filled 2020-04-23: qty 5

## 2020-04-23 MED ORDER — PALONOSETRON HCL INJECTION 0.25 MG/5ML
0.2500 mg | Freq: Once | INTRAVENOUS | Status: AC
  Administered 2020-04-23: 0.25 mg via INTRAVENOUS

## 2020-04-23 MED ORDER — SODIUM CHLORIDE 0.9% FLUSH
10.0000 mL | INTRAVENOUS | Status: DC | PRN
  Administered 2020-04-23: 10 mL
  Filled 2020-04-23: qty 10

## 2020-04-23 NOTE — Patient Instructions (Signed)

## 2020-04-23 NOTE — Progress Notes (Signed)
OK to treat per Dr. Ennever. 

## 2020-04-23 NOTE — Progress Notes (Signed)
Hematology and Oncology Follow Up Visit  [name] [id number] 02-17-1951 70 y.o. 04/23/2020   Principle Diagnosis:  IgG kappa myeloma  Anemia secondary to renal insufficiency  Intermittent iron - deficiency anemia  Hypotestosteronemia  Current Therapy: Aredia 60 mg IV q 3 months-- next dose 06/2020 Aranesp 300 mcg subcu as needed for hemoglobin less than 11 DepoTestosterone400 mg q4weeks IV iron as indicated Kyprolis/Cytoxan - s/p cycle27   Interim History:  [name] is here today for follow-up and treatment. He is having a lot of back and joint pain with arthritis/fibromyalgia. He states that he follows up with ortho next week and will get another injection in his back which he feels does help.  He is feeling fatigued and notes mild SOB at times with over exertion. He takes breaks to rest when needed.  He notes tenderness in the right neck along his port line. No redness or edema noted. No JVD.  Port flushed and blood return noted without difficulty per RN.  No adenopathy noted on exam. No swelling in the extremities noted.  February M-spike not observed, IgG level was 1,166 mg/dL and kappa light chains 4.04 mg/dL.  No fever, chills, n/v, cough, rash, dizziness, chest pain, palpitations, abdominal pain or changes in bowel or bladder habits.  He has not noted any blood loss. No bruising or petechiae.  The swelling in his feet and ankles is minimal. Pedal pulses are 2+.  The neuropathy in his hands and feet is unchanged.  No falls or syncope.  He has no appetite but states that he is making himself eat. He is doing his best to stay well hydrated. His weight is stable at 189 lbs.   ECOG Performance Status: 1 - Symptomatic but completely ambulatory  Medications:  Allergies as of 04/23/2020      Reactions   Iodinated Diagnostic Agents Rash   Patient states he was instructed not to take IV contrast.  In 2008 he had an unknown reaction, and was told not to take it  again.  He was also told not to take it due to his kidneys.   Iodine Anxiety, Rash, Other (See Comments)   Didn't feel right "instructed not to take per MD--something with his port"      Medication List       Accurate as of April 23, 2020  9:57 AM. If you have any questions, ask your nurse or doctor.        amLODipine 10 MG tablet Commonly known as: NORVASC Take 10 mg by mouth daily.   atorvastatin 40 MG tablet Commonly known as: LIPITOR Take 40 mg by mouth daily at 6 PM.   Carafate 1 GM/10ML suspension Generic drug: sucralfate Take 1 g by mouth 4 (four) times daily.   [employer] Fiber Gummies 2 g Chew Chew by mouth.   dicyclomine 10 MG capsule Commonly known as: BENTYL TAKE 1 CAPSULE (10 MG TOTAL) BY MOUTH 3 (THREE) TIMES DAILY BEFORE MEALS.   dronabinol 2.5 MG capsule Commonly known as: MARINOL Take 2.5 mg by mouth.   gabapentin 300 MG capsule Commonly known as: NEURONTIN TAKE 1 CAPSULE BY MOUTH THREE TIMES A DAY What changed:   how much to take  how to take this  when to take this  additional instructions   glimepiride 2 MG tablet Commonly known as: AMARYL Take 2 mg by mouth daily with breakfast.   glipiZIDE 5 MG tablet Commonly known as: GLUCOTROL Take 5 mg by mouth.   hydrocortisone 2.5 %  cream Apply topically.   lactulose 10 GM/15ML solution Commonly known as: CHRONULAC Take 30 mLs (20 g total) by mouth daily.   latanoprost 0.005 % ophthalmic solution Commonly known as: XALATAN 1 drop at bedtime.   linaclotide 145 MCG Caps capsule Commonly known as: LINZESS Take by mouth.   losartan 50 MG tablet Commonly known as: COZAAR Take 50 mg by mouth daily.   Lumigan 0.01 % Soln Generic drug: bimatoprost SMARTSIG:In Eye(s)   metFORMIN 500 MG tablet Commonly known as: GLUCOPHAGE Take 500 mg by mouth 2 (two) times daily.   metoprolol succinate 25 MG 24 hr tablet Commonly known as: TOPROL-XL Take 25 mg by mouth daily.   mirabegron ER 50 MG Tb24  tablet Commonly known as: MYRBETRIQ Take 50 mg by mouth daily.   morphine 30 MG 12 hr tablet Commonly known as: MS CONTIN Take 30 mg by mouth 2 (two) times daily.   omeprazole 40 MG capsule Commonly known as: PRILOSEC TAKE 1 CAPSULE BY MOUTH TWICE A DAY   ondansetron 8 MG tablet Commonly known as: ZOFRAN TAKE 1 TABLET(8 MG) BY MOUTH TWICE DAILY AS NEEDED FOR NAUSEA OR VOMITING   oxyCODONE-acetaminophen 10-325 MG tablet Commonly known as: PERCOCET Take 1 tablet by mouth every 6 (six) hours as needed for pain.   pioglitazone 30 MG tablet Commonly known as: ACTOS Take 30 mg by mouth daily.   prochlorperazine 10 MG tablet Commonly known as: COMPAZINE take 1 tablet by mouth every 6 hours if needed for nausea and vomiting   Restasis 0.05 % ophthalmic emulsion Generic drug: cycloSPORINE Place 1 drop into both eyes daily.   SUMAtriptan 100 MG tablet Commonly known as: IMITREX Take 100 mg by mouth daily.   topiramate 25 MG capsule Commonly known as: TOPAMAX Take 25 mg by mouth 2 (two) times daily.   topiramate 25 MG tablet Commonly known as: TOPAMAX Take 25 mg by mouth daily.   torsemide 20 MG tablet Commonly known as: DEMADEX Take 40 mg by mouth daily.   Trulicity 1.61 WR/6.0AV Sopn Generic drug: Dulaglutide Inject 0.75 mg into the skin every Saturday.   Vitamin D (Ergocalciferol) 1.25 MG (50000 UNIT) Caps capsule Commonly known as: DRISDOL Take 50,000 Units by mouth every Saturday.       Allergies:  Allergies  Allergen Reactions  . Iodinated Diagnostic Agents Rash    Patient states he was instructed not to take IV contrast.  In 2008 he had an unknown reaction, and was told not to take it again.  He was also told not to take it due to his kidneys.  . Iodine Anxiety, Rash and Other (See Comments)    Didn't feel right "instructed not to take per MD--something with his port"     Past Medical History, Surgical history, Social history, and Family History were  reviewed and updated.  Review of Systems: All other 10 point review of systems is negative.   Physical Exam:  vitals were not taken for this visit.   Wt Readings from Last 3 Encounters:  03/26/20 180 lb (81.6 kg)  02/26/20 180 lb (81.6 kg)  01/29/20 192 lb (87.1 kg)    Ocular: Sclerae unicteric, pupils equal, round and reactive to light Ear-nose-throat: Oropharynx clear, dentition fair Lymphatic: No cervical or supraclavicular adenopathy Lungs no rales or rhonchi, good excursion bilaterally Heart regular rate and rhythm, no murmur appreciated Abd soft, nontender, positive bowel sounds MSK no focal spinal tenderness, no joint edema Neuro: non-focal, well-oriented, appropriate affect Breasts: Deferred  Lab Results  Component Value Date   WBC 4.7 04/23/2020   HGB 10.7 (L) 04/23/2020   HCT 32.9 (L) 04/23/2020   MCV 78.7 (L) 04/23/2020   PLT 196 04/23/2020   Lab Results  Component Value Date   FERRITIN 2,082 (H) 03/26/2020   IRON 70 03/26/2020   TIBC 234 03/26/2020   UIBC 164 03/26/2020   IRONPCTSAT 30 03/26/2020   Lab Results  Component Value Date   RETICCTPCT 1.0 02/26/2020   RBC 4.18 (L) 04/23/2020   RETICCTABS 27.5 03/21/2014   Lab Results  Component Value Date   KPAFRELGTCHN 40.4 (H) 03/26/2020   LAMBDASER 23.0 03/26/2020   KAPLAMBRATIO 1.76 (H) 03/26/2020   Lab Results  Component Value Date   IGGSERUM 1,166 03/26/2020   IGA 272 03/26/2020   IGMSERUM 29 03/26/2020   Lab Results  Component Value Date   TOTALPROTELP 7.1 03/26/2020   ALBUMINELP 3.9 03/26/2020   A1GS 0.2 03/26/2020   A2GS 1.0 03/26/2020   BETS 0.9 03/26/2020   BETA2SER 0.5 11/07/2014   GAMS 1.2 03/26/2020   MSPIKE Not Observed 03/26/2020   SPEI Comment 01/29/2020     Chemistry      Component Value Date/Time   NA 138 04/23/2020 0840   NA 143 02/22/2017 0744   K 3.6 04/23/2020 0840   K 4.0 02/22/2017 0744   CL 100 04/23/2020 0840   CL 106 02/22/2017 0744   CO2 31 04/23/2020  0840   CO2 27 02/22/2017 0744   BUN 25 (H) 04/23/2020 0840   BUN 26 (H) 02/22/2017 0744   CREATININE 2.57 (H) 04/23/2020 0840   CREATININE 2.3 (H) 02/22/2017 0744      Component Value Date/Time   CALCIUM 9.7 04/23/2020 0840   CALCIUM 9.3 02/22/2017 0744   ALKPHOS 85 04/23/2020 0840   ALKPHOS 80 02/22/2017 0744   AST 21 04/23/2020 0840   ALT 13 04/23/2020 0840   ALT 22 02/22/2017 0744   BILITOT 0.4 04/23/2020 0840       Impression and Plan: Mr. Boomer is a very pleasant 70 yo African American gentleman with IgG kappa myeloma.Protein studies are pending.  We will proceed with treatment today as planned per Dr. [name].  We will get a dye study to assess his port.  He will also received Aranesp and testosterone today.  Iron studies are pending. We can replace if needed.  Follow-up in 4 weeks.  He can contact our office with any questions or concerns.   Laverna Peace, NP 3/1/20229:57 AM

## 2020-04-24 ENCOUNTER — Ambulatory Visit (INDEPENDENT_AMBULATORY_CARE_PROVIDER_SITE_OTHER): Payer: 59 | Admitting: Podiatry

## 2020-04-24 ENCOUNTER — Encounter: Payer: Self-pay | Admitting: Podiatry

## 2020-04-24 DIAGNOSIS — M79674 Pain in right toe(s): Secondary | ICD-10-CM | POA: Diagnosis not present

## 2020-04-24 DIAGNOSIS — B351 Tinea unguium: Secondary | ICD-10-CM

## 2020-04-24 DIAGNOSIS — M79675 Pain in left toe(s): Secondary | ICD-10-CM

## 2020-04-24 DIAGNOSIS — E1151 Type 2 diabetes mellitus with diabetic peripheral angiopathy without gangrene: Secondary | ICD-10-CM | POA: Diagnosis not present

## 2020-04-24 DIAGNOSIS — L84 Corns and callosities: Secondary | ICD-10-CM | POA: Insufficient documentation

## 2020-04-24 LAB — KAPPA/LAMBDA LIGHT CHAINS
Kappa free light chain: 42.7 mg/L — ABNORMAL HIGH (ref 3.3–19.4)
Kappa, lambda light chain ratio: 1.8 — ABNORMAL HIGH (ref 0.26–1.65)
Lambda free light chains: 23.7 mg/L (ref 5.7–26.3)

## 2020-04-24 LAB — IGG, IGA, IGM
IgA: 235 mg/dL (ref 61–437)
IgG (Immunoglobin G), Serum: 1047 mg/dL (ref 603–1613)
IgM (Immunoglobulin M), Srm: 29 mg/dL (ref 20–172)

## 2020-04-24 LAB — TESTOSTERONE: Testosterone: 370 ng/dL (ref 264–916)

## 2020-04-24 NOTE — Progress Notes (Signed)
This patient returns to my office for at risk foot care.  This patient requires this care by a professional since this patient will be at risk due to having diabetes type 2.  This patient is unable to cut nails himself since the patient cannot reach his nails.These nails are painful walking and wearing shoes.  This patient presents for at risk foot care today.  General Appearance  Alert, conversant and in no acute stress.  Vascular  Dorsalis pedis and posterior tibial  pulses are weakly  palpable  bilaterally.  Capillary return is within normal limits  bilaterally. Cold feet bilaterally.  Absent digital hair.  Neurologic  Senn-Weinstein monofilament wire test within normal limits  bilaterally. Muscle power within normal limits bilaterally.  Nails Thick disfigured discolored nails with subungual debris  from hallux to fifth toes bilaterally. No evidence of bacterial infection or drainage bilaterally.  Orthopedic  No limitations of motion  feet .  No crepitus or effusions noted.  No bony pathology or digital deformities noted.  HAV  B/L.  Exostosis right foot.  Skin  normotropic skin with no porokeratosis noted bilaterally.  No signs of infections or ulcers noted.  Corn lateral aspect fourth toe right foot. No infection.  Onychomycosis  Pain in right toes  Pain in left toes  Consent was obtained for treatment procedures.   Mechanical debridement of nails 1-5  bilaterally performed with a nail nipper.  Filed with dremel without incident. Debride corn fourth toe right foot lateral border.   Return office visit    10 weeks                  Told patient to return for periodic foot care and evaluation due to potential at risk complications.   Gardiner Barefoot DPM

## 2020-04-25 ENCOUNTER — Ambulatory Visit (HOSPITAL_COMMUNITY)
Admission: RE | Admit: 2020-04-25 | Discharge: 2020-04-25 | Disposition: A | Discharge status: Home / Self Care | Payer: 59 | Source: Ambulatory Visit | Attending: Family | Admitting: Family

## 2020-04-25 ENCOUNTER — Other Ambulatory Visit: Payer: Self-pay

## 2020-04-25 ENCOUNTER — Other Ambulatory Visit: Payer: Self-pay | Admitting: Family

## 2020-04-25 DIAGNOSIS — Z95828 Presence of other vascular implants and grafts: Secondary | ICD-10-CM

## 2020-04-25 DIAGNOSIS — C9000 Multiple myeloma not having achieved remission: Secondary | ICD-10-CM | POA: Insufficient documentation

## 2020-04-25 HISTORY — PX: IR CHEST FLUORO: IMG2383

## 2020-04-25 LAB — PROTEIN ELECTROPHORESIS, SERUM, WITH REFLEX
A/G Ratio: 1.1 (ref 0.7–1.7)
Albumin ELP: 3.7 g/dL (ref 2.9–4.4)
Alpha-1-Globulin: 0.3 g/dL (ref 0.0–0.4)
Alpha-2-Globulin: 1 g/dL (ref 0.4–1.0)
Beta Globulin: 0.9 g/dL (ref 0.7–1.3)
Gamma Globulin: 1.1 g/dL (ref 0.4–1.8)
Globulin, Total: 3.3 g/dL (ref 2.2–3.9)
Total Protein ELP: 7 g/dL (ref 6.0–8.5)

## 2020-04-25 NOTE — Progress Notes (Signed)
IR.  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  Patient presented to [facility] IR today secondary to pain related to Port-a-cath. Per patient, he has pain in his right upper chest/neck near where tunneling of Port-a-cath catheter is located. States Port-a-cath functions without difficulties.  Patient seen/evaulated by this PA and Dr. Laurence Ferrari. Right chest at site of Port-a-cath well healed, no erythema, drainage, or active bleeding noted. Unable to do injection secondary to contrast allergy. Per patient, "This is how it felt last time I had an infection. Dr. [name] gave me antibiotics and it took care of it.".  Prescription for Keflex (500 mg tablets, take one tablet by mouth four times daily for seven days) given to patient per Dr. Laurence Ferrari. Please call IR with questions/concerns.   Bea Graff Babara Buffalo, PA-C 04/25/2020, 10:44 AM

## 2020-04-26 ENCOUNTER — Telehealth: Payer: Self-pay | Admitting: *Deleted

## 2020-04-26 NOTE — Telephone Encounter (Signed)
Notified pt of results.no concerns at this time.

## 2020-04-26 NOTE — Telephone Encounter (Signed)
-----   Message from Eliezer Bottom, NP sent at 04/26/2020  3:16 PM EST ----- Port line looks ok! No fibrin sheath or kinks!  Sarah  ----- Message ----- From: Buel Ream, Rad Results In Sent: 04/26/2020   7:46 AM EST To: Eliezer Bottom, NP

## 2020-05-02 ENCOUNTER — Encounter (HOSPITAL_BASED_OUTPATIENT_CLINIC_OR_DEPARTMENT_OTHER): Payer: Self-pay | Admitting: Orthopedic Surgery

## 2020-05-03 ENCOUNTER — Encounter (HOSPITAL_BASED_OUTPATIENT_CLINIC_OR_DEPARTMENT_OTHER): Payer: Self-pay | Admitting: Orthopedic Surgery

## 2020-05-03 ENCOUNTER — Other Ambulatory Visit: Payer: Self-pay

## 2020-05-03 NOTE — Progress Notes (Signed)
Pt's pmh, office visit note (Nephrology Dr. Neta Ehlers,) lab results from 04/29/20 and ekg 04/30/19 reviewed wit Dr Ambrose Pancoast. Pt ok to be done here and no PAT apt needed.

## 2020-05-06 ENCOUNTER — Other Ambulatory Visit (HOSPITAL_COMMUNITY)
Admission: RE | Admit: 2020-05-06 | Discharge: 2020-05-06 | Disposition: A | Discharge status: Home / Self Care | Payer: 59 | Source: Ambulatory Visit | Attending: Orthopedic Surgery | Admitting: Orthopedic Surgery

## 2020-05-06 DIAGNOSIS — Z01812 Encounter for preprocedural laboratory examination: Secondary | ICD-10-CM | POA: Insufficient documentation

## 2020-05-06 DIAGNOSIS — Z20822 Contact with and (suspected) exposure to covid-19: Secondary | ICD-10-CM | POA: Diagnosis not present

## 2020-05-06 LAB — SARS CORONAVIRUS 2 (TAT 6-24 HRS): SARS Coronavirus 2: NEGATIVE

## 2020-05-07 ENCOUNTER — Encounter: Payer: Self-pay | Admitting: *Deleted

## 2020-05-08 ENCOUNTER — Other Ambulatory Visit: Payer: Self-pay | Admitting: Hematology & Oncology

## 2020-05-08 DIAGNOSIS — R1084 Generalized abdominal pain: Secondary | ICD-10-CM

## 2020-05-08 DIAGNOSIS — R1319 Other dysphagia: Secondary | ICD-10-CM

## 2020-05-09 ENCOUNTER — Ambulatory Visit (HOSPITAL_BASED_OUTPATIENT_CLINIC_OR_DEPARTMENT_OTHER)
Admission: RE | Admit: 2020-05-09 | Discharge: 2020-05-09 | Disposition: A | Discharge status: Home / Self Care | Payer: 59 | Attending: Orthopedic Surgery | Admitting: Orthopedic Surgery

## 2020-05-09 ENCOUNTER — Encounter (HOSPITAL_BASED_OUTPATIENT_CLINIC_OR_DEPARTMENT_OTHER)
Admission: RE | Disposition: A | Discharge status: Home / Self Care | Payer: Self-pay | Source: Home / Self Care | Attending: Orthopedic Surgery

## 2020-05-09 ENCOUNTER — Encounter (HOSPITAL_BASED_OUTPATIENT_CLINIC_OR_DEPARTMENT_OTHER): Payer: Self-pay | Admitting: Orthopedic Surgery

## 2020-05-09 ENCOUNTER — Ambulatory Visit (HOSPITAL_BASED_OUTPATIENT_CLINIC_OR_DEPARTMENT_OTHER): Payer: 59 | Admitting: Anesthesiology

## 2020-05-09 ENCOUNTER — Other Ambulatory Visit: Payer: Self-pay

## 2020-05-09 DIAGNOSIS — Z8619 Personal history of other infectious and parasitic diseases: Secondary | ICD-10-CM | POA: Insufficient documentation

## 2020-05-09 DIAGNOSIS — Z8579 Personal history of other malignant neoplasms of lymphoid, hematopoietic and related tissues: Secondary | ICD-10-CM | POA: Diagnosis not present

## 2020-05-09 DIAGNOSIS — Z808 Family history of malignant neoplasm of other organs or systems: Secondary | ICD-10-CM | POA: Diagnosis not present

## 2020-05-09 DIAGNOSIS — B079 Viral wart, unspecified: Secondary | ICD-10-CM | POA: Insufficient documentation

## 2020-05-09 DIAGNOSIS — Z7984 Long term (current) use of oral hypoglycemic drugs: Secondary | ICD-10-CM | POA: Insufficient documentation

## 2020-05-09 DIAGNOSIS — Z8249 Family history of ischemic heart disease and other diseases of the circulatory system: Secondary | ICD-10-CM | POA: Diagnosis not present

## 2020-05-09 DIAGNOSIS — I10 Essential (primary) hypertension: Secondary | ICD-10-CM | POA: Diagnosis not present

## 2020-05-09 DIAGNOSIS — Z800 Family history of malignant neoplasm of digestive organs: Secondary | ICD-10-CM | POA: Insufficient documentation

## 2020-05-09 DIAGNOSIS — E1140 Type 2 diabetes mellitus with diabetic neuropathy, unspecified: Secondary | ICD-10-CM | POA: Insufficient documentation

## 2020-05-09 DIAGNOSIS — Z803 Family history of malignant neoplasm of breast: Secondary | ICD-10-CM | POA: Insufficient documentation

## 2020-05-09 DIAGNOSIS — Z91041 Radiographic dye allergy status: Secondary | ICD-10-CM | POA: Insufficient documentation

## 2020-05-09 DIAGNOSIS — Z8616 Personal history of COVID-19: Secondary | ICD-10-CM | POA: Diagnosis not present

## 2020-05-09 DIAGNOSIS — R2231 Localized swelling, mass and lump, right upper limb: Secondary | ICD-10-CM | POA: Insufficient documentation

## 2020-05-09 DIAGNOSIS — E785 Hyperlipidemia, unspecified: Secondary | ICD-10-CM | POA: Diagnosis not present

## 2020-05-09 DIAGNOSIS — Z833 Family history of diabetes mellitus: Secondary | ICD-10-CM | POA: Diagnosis not present

## 2020-05-09 DIAGNOSIS — Z79899 Other long term (current) drug therapy: Secondary | ICD-10-CM | POA: Diagnosis not present

## 2020-05-09 HISTORY — PX: MASS EXCISION: SHX2000

## 2020-05-09 LAB — GLUCOSE, CAPILLARY
Glucose-Capillary: 100 mg/dL — ABNORMAL HIGH (ref 70–99)
Glucose-Capillary: 143 mg/dL — ABNORMAL HIGH (ref 70–99)

## 2020-05-09 SURGERY — EXCISION MASS
Anesthesia: General | Site: Finger | Laterality: Right

## 2020-05-09 MED ORDER — HYDROMORPHONE HCL 1 MG/ML IJ SOLN
0.2500 mg | INTRAMUSCULAR | Status: DC | PRN
Start: 2020-05-09 — End: 2020-05-09

## 2020-05-09 MED ORDER — PROPOFOL 10 MG/ML IV BOLUS
INTRAVENOUS | Status: DC | PRN
  Administered 2020-05-09: 150 mg via INTRAVENOUS

## 2020-05-09 MED ORDER — BUPIVACAINE HCL (PF) 0.25 % IJ SOLN
INTRAMUSCULAR | Status: DC | PRN
  Administered 2020-05-09: 8 mL

## 2020-05-09 MED ORDER — SUCCINYLCHOLINE CHLORIDE 200 MG/10ML IV SOSY
PREFILLED_SYRINGE | INTRAVENOUS | Status: AC
  Filled 2020-05-09: qty 10

## 2020-05-09 MED ORDER — SUCCINYLCHOLINE CHLORIDE 20 MG/ML IJ SOLN
INTRAMUSCULAR | Status: DC | PRN
  Administered 2020-05-09: 120 mg via INTRAVENOUS

## 2020-05-09 MED ORDER — OXYCODONE HCL 5 MG PO TABS: 5.0000 mg | ORAL_TABLET | Freq: Once | ORAL | Status: DC | PRN

## 2020-05-09 MED ORDER — PHENYLEPHRINE HCL (PRESSORS) 10 MG/ML IV SOLN
INTRAVENOUS | Status: DC | PRN
  Administered 2020-05-09: 120 ug via INTRAVENOUS
  Administered 2020-05-09 (×2): 80 ug via INTRAVENOUS
  Administered 2020-05-09: 120 ug via INTRAVENOUS

## 2020-05-09 MED ORDER — LACTATED RINGERS IV SOLN: INTRAVENOUS | Status: DC

## 2020-05-09 MED ORDER — OXYCODONE HCL 5 MG PO TABS: ORAL_TABLET | ORAL | 0 refills | Status: DC

## 2020-05-09 MED ORDER — FENTANYL CITRATE (PF) 100 MCG/2ML IJ SOLN
INTRAMUSCULAR | Status: AC
  Filled 2020-05-09: qty 2

## 2020-05-09 MED ORDER — MIDAZOLAM HCL 2 MG/2ML IJ SOLN
INTRAMUSCULAR | Status: AC
  Filled 2020-05-09: qty 2

## 2020-05-09 MED ORDER — EPHEDRINE SULFATE 50 MG/ML IJ SOLN
INTRAMUSCULAR | Status: DC | PRN
  Administered 2020-05-09: 15 mg via INTRAVENOUS
  Administered 2020-05-09: 10 mg via INTRAVENOUS

## 2020-05-09 MED ORDER — ONDANSETRON HCL 4 MG/2ML IJ SOLN
INTRAMUSCULAR | Status: AC
  Filled 2020-05-09: qty 2

## 2020-05-09 MED ORDER — CEFAZOLIN SODIUM-DEXTROSE 2-4 GM/100ML-% IV SOLN
2.0000 g | INTRAVENOUS | Status: AC
  Administered 2020-05-09: 2 g via INTRAVENOUS

## 2020-05-09 MED ORDER — OXYCODONE HCL 5 MG/5ML PO SOLN: 5.0000 mg | Freq: Once | ORAL | Status: DC | PRN

## 2020-05-09 MED ORDER — PROMETHAZINE HCL 25 MG/ML IJ SOLN
6.2500 mg | INTRAMUSCULAR | Status: DC | PRN
Start: 2020-05-09 — End: 2020-05-09

## 2020-05-09 MED ORDER — DEXAMETHASONE SODIUM PHOSPHATE 10 MG/ML IJ SOLN
INTRAMUSCULAR | Status: DC | PRN
  Administered 2020-05-09: 5 mg via INTRAVENOUS

## 2020-05-09 MED ORDER — GLYCOPYRROLATE 0.2 MG/ML IJ SOLN
INTRAMUSCULAR | Status: DC | PRN
  Administered 2020-05-09: .2 mg via INTRAVENOUS

## 2020-05-09 MED ORDER — SOD CITRATE-CITRIC ACID 500-334 MG/5ML PO SOLN
ORAL | Status: AC
  Filled 2020-05-09: qty 15

## 2020-05-09 MED ORDER — AMISULPRIDE (ANTIEMETIC) 5 MG/2ML IV SOLN: 10.0000 mg | Freq: Once | INTRAVENOUS | Status: DC | PRN

## 2020-05-09 MED ORDER — MIDAZOLAM HCL 2 MG/2ML IJ SOLN
INTRAMUSCULAR | Status: DC | PRN
  Administered 2020-05-09: 2 mg via INTRAVENOUS

## 2020-05-09 MED ORDER — CEFAZOLIN SODIUM-DEXTROSE 2-4 GM/100ML-% IV SOLN
INTRAVENOUS | Status: AC
  Filled 2020-05-09: qty 100

## 2020-05-09 MED ORDER — ONDANSETRON HCL 4 MG/2ML IJ SOLN
INTRAMUSCULAR | Status: DC | PRN
  Administered 2020-05-09: 4 mg via INTRAVENOUS

## 2020-05-09 MED ORDER — GLYCOPYRROLATE 0.2 MG/ML IJ SOLN
INTRAMUSCULAR | Status: AC
  Filled 2020-05-09: qty 1

## 2020-05-09 MED ORDER — PROPOFOL 10 MG/ML IV BOLUS
INTRAVENOUS | Status: AC
  Filled 2020-05-09: qty 20

## 2020-05-09 MED ORDER — LIDOCAINE 2% (20 MG/ML) 5 ML SYRINGE
INTRAMUSCULAR | Status: AC
  Filled 2020-05-09: qty 5

## 2020-05-09 MED ORDER — FENTANYL CITRATE (PF) 100 MCG/2ML IJ SOLN
INTRAMUSCULAR | Status: DC | PRN
  Administered 2020-05-09: 100 ug via INTRAVENOUS

## 2020-05-09 MED ORDER — SOD CITRATE-CITRIC ACID 500-334 MG/5ML PO SOLN
30.0000 mL | Freq: Once | ORAL | Status: AC
  Administered 2020-05-09: 30 mL via ORAL

## 2020-05-09 MED ORDER — LIDOCAINE HCL (CARDIAC) PF 100 MG/5ML IV SOSY
PREFILLED_SYRINGE | INTRAVENOUS | Status: DC | PRN
  Administered 2020-05-09: 80 mg via INTRAVENOUS

## 2020-05-09 SURGICAL SUPPLY — 55 items
APL PRP STRL LF DISP 70% ISPRP (MISCELLANEOUS) ×1
APL SKNCLS STERI-STRIP NONHPOA (GAUZE/BANDAGES/DRESSINGS)
BENZOIN TINCTURE PRP APPL 2/3 (GAUZE/BANDAGES/DRESSINGS) IMPLANT
BLADE MINI RND TIP GREEN BEAV (BLADE) IMPLANT
BLADE SURG 15 STRL LF DISP TIS (BLADE) ×2 IMPLANT
BLADE SURG 15 STRL SS (BLADE) ×4
BNDG CMPR 9X4 STRL LF SNTH (GAUZE/BANDAGES/DRESSINGS) ×1
BNDG COHESIVE 1X5 TAN STRL LF (GAUZE/BANDAGES/DRESSINGS) IMPLANT
BNDG COHESIVE 2X5 TAN STRL LF (GAUZE/BANDAGES/DRESSINGS) ×1 IMPLANT
BNDG CONFORM 2 STRL LF (GAUZE/BANDAGES/DRESSINGS) IMPLANT
BNDG ELASTIC 2X5.8 VLCR STR LF (GAUZE/BANDAGES/DRESSINGS) IMPLANT
BNDG ELASTIC 3X5.8 VLCR STR LF (GAUZE/BANDAGES/DRESSINGS) IMPLANT
BNDG ESMARK 4X9 LF (GAUZE/BANDAGES/DRESSINGS) ×1 IMPLANT
BNDG GAUZE 1X2.1 STRL (MISCELLANEOUS) IMPLANT
BNDG GAUZE ELAST 4 BULKY (GAUZE/BANDAGES/DRESSINGS) IMPLANT
BNDG PLASTER X FAST 3X3 WHT LF (CAST SUPPLIES) IMPLANT
BNDG PLSTR 9X3 FST ST WHT (CAST SUPPLIES)
CHLORAPREP W/TINT 26 (MISCELLANEOUS) ×2 IMPLANT
CORD BIPOLAR FORCEPS 12FT (ELECTRODE) ×2 IMPLANT
COVER BACK TABLE 60X90IN (DRAPES) ×2 IMPLANT
COVER MAYO STAND STRL (DRAPES) ×2 IMPLANT
COVER WAND RF STERILE (DRAPES) IMPLANT
CUFF TOURN SGL QUICK 18X4 (TOURNIQUET CUFF) ×2 IMPLANT
DRAPE EXTREMITY T 121X128X90 (DISPOSABLE) ×2 IMPLANT
DRAPE SURG 17X23 STRL (DRAPES) ×2 IMPLANT
GAUZE SPONGE 4X4 12PLY STRL (GAUZE/BANDAGES/DRESSINGS) ×2 IMPLANT
GAUZE XEROFORM 1X8 LF (GAUZE/BANDAGES/DRESSINGS) ×2 IMPLANT
GLOVE SRG 8 PF TXTR STRL LF DI (GLOVE) ×1 IMPLANT
GLOVE SURG ENC MOIS LTX SZ7.5 (GLOVE) ×4 IMPLANT
GLOVE SURG UNDER POLY LF SZ8 (GLOVE) ×6
GOWN STRL REUS W/ TWL LRG LVL3 (GOWN DISPOSABLE) ×1 IMPLANT
GOWN STRL REUS W/TWL LRG LVL3 (GOWN DISPOSABLE) ×2
GOWN STRL REUS W/TWL XL LVL3 (GOWN DISPOSABLE) ×4 IMPLANT
NDL HYPO 25X1 1.5 SAFETY (NEEDLE) ×1 IMPLANT
NEEDLE HYPO 25X1 1.5 SAFETY (NEEDLE) ×2 IMPLANT
NS IRRIG 1000ML POUR BTL (IV SOLUTION) ×2 IMPLANT
PACK BASIN DAY SURGERY FS (CUSTOM PROCEDURE TRAY) ×2 IMPLANT
PAD CAST 3X4 CTTN HI CHSV (CAST SUPPLIES) IMPLANT
PAD CAST 4YDX4 CTTN HI CHSV (CAST SUPPLIES) IMPLANT
PADDING CAST ABS 4INX4YD NS (CAST SUPPLIES) ×1
PADDING CAST ABS COTTON 4X4 ST (CAST SUPPLIES) ×1 IMPLANT
PADDING CAST COTTON 3X4 STRL (CAST SUPPLIES)
PADDING CAST COTTON 4X4 STRL (CAST SUPPLIES)
STOCKINETTE 4X48 STRL (DRAPES) ×2 IMPLANT
STRIP CLOSURE SKIN 1/2X4 (GAUZE/BANDAGES/DRESSINGS) IMPLANT
SUT ETHILON 3 0 PS 1 (SUTURE) IMPLANT
SUT ETHILON 4 0 PS 2 18 (SUTURE) ×2 IMPLANT
SUT ETHILON 5 0 P 3 18 (SUTURE)
SUT MON AB 5-0 P3 18 (SUTURE) ×1 IMPLANT
SUT NYLON ETHILON 5-0 P-3 1X18 (SUTURE) IMPLANT
SUT VIC AB 4-0 P2 18 (SUTURE) IMPLANT
SYR BULB EAR ULCER 3OZ GRN STR (SYRINGE) ×2 IMPLANT
SYR CONTROL 10ML LL (SYRINGE) ×2 IMPLANT
TOWEL GREEN STERILE FF (TOWEL DISPOSABLE) ×3 IMPLANT
UNDERPAD 30X36 HEAVY ABSORB (UNDERPADS AND DIAPERS) ×2 IMPLANT

## 2020-05-09 NOTE — Transfer of Care (Signed)
Immediate Anesthesia Transfer of Care Note  Patient: [name]  Procedure(s) Performed: RIGHT LONG FINGER EXCISION MASS, (Right Finger)  Patient Location: PACU  Anesthesia Type:General  Level of Consciousness: drowsy and patient cooperative  Airway & Oxygen Therapy: Patient Spontanous Breathing and Patient connected to face mask oxygen  Post-op Assessment: Report given to RN, Post -op Vital signs reviewed and stable, Patient moving all extremities X 4 and Patient able to stick tongue midline  Post vital signs: Reviewed and stable  Last Vitals:  Vitals Value Taken Time  BP 105/64 05/09/20 1102  Temp    Pulse 62 05/09/20 1104  Resp 18 05/09/20 1104  SpO2 100 % 05/09/20 1104  Vitals shown include unvalidated device data.  Last Pain:  Vitals:   05/09/20 0720  TempSrc: Oral  PainSc: 8       Patients Stated Pain Goal: 8 (82/70/78 6754)  Complications: No complications documented.

## 2020-05-09 NOTE — H&P (Signed)
[name] is an 70 y.o. male.   Chief Complaint: finger mass HPI: 70 yo male with mass right long finger mass x 3 months.  It is bothersome to him.  He wishes to have the mass removed and the dip joint debrided as necessary.  Allergies:  Allergies  Allergen Reactions  . Iodinated Diagnostic Agents Rash    Patient states he was instructed not to take IV contrast.  In 2008 he had an unknown reaction, and was told not to take it again.  He was also told not to take it due to his kidneys.  . Iodine Anxiety, Rash and Other (See Comments)    Didn't feel right "instructed not to take per MD--something with his port"     Past Medical History:  Diagnosis Date  . Anemia   . 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 Anemia, iron deficiency 03/12/2011  . Anxiety   . Anxiety disorder   . Arthralgia of multiple joints 12/31/2010  . Arthritis   . Constipation   . Diabetes mellitus without complication ([place])   . Diabetic neuropathy ([place])   . Diverticulosis   . Epidermoid cyst of skin of chest 08/29/2018  . Fibromyalgia   . GERD (gastroesophageal reflux disease)   . Headache   . Hemorrhoids   . Herpes zoster   . History of COVID-19 04/03/2019  . Hyperlipidemia   . Hypertension   . Hypotestosteronism 03/17/2012  . Multiple myeloma   . Multiple myeloma ([place])   . Myeloma ([place]) 12/31/2010  . Nevus of left elbow 08/29/2018  . Pneumonia   . Renal insufficiency   . Type 2 diabetes mellitus ([place])   . Urinary retention     Past Surgical History:  Procedure Laterality Date  . CATARACT EXTRACTION     bi-lateral  . COLONOSCOPY    . COLONOSCOPY  07/13/2011   Procedure: COLONOSCOPY;  Surgeon: Ladene Artist, MD,FACG;  Location: [facility];  Service: Endoscopy;  Laterality: N/A;  . CYST EXCISION Right 08/29/2018   Procedure: CYST REMOVAL;  Surgeon: Fanny Skates, MD;  Location: [place];  Service: General;  Laterality: Right;  . ESOPHAGOGASTRODUODENOSCOPY    . IR CHEST FLUORO   09/22/2016  . IR CHEST FLUORO  04/25/2020  . IR FLUORO GUIDE PORT INSERTION RIGHT  09/23/2016  . IR REMOVAL TUN ACCESS W/ PORT W/O FL MOD SED  09/23/2016  . IR US GUIDE VASC ACCESS LEFT  09/23/2016  . KNEE ARTHROSCOPY     left  . KNEE SURGERY    . NEVUS EXCISION N/A 08/29/2018   Procedure: , NEVUS LEFT ELBOW;  Surgeon: Fanny Skates, MD;  Location: [place];  Service: General;  Laterality: N/A;  . PORTACATH PLACEMENT    . POSTERIOR CERVICAL FUSION/FORAMINOTOMY N/A 06/05/2016   Procedure: LAMINECTOMY CERVICAL 3 - CERVICAL 6 WITH LATERAL MASS FUSION;  Surgeon: Consuella Lose, MD;  Location: [place];  Service: Neurosurgery;  Laterality: N/A;  LAMINECTOMY CERVICAL 3 - CERVICAL 6 WITH LATERAL MASS FUSION  . TONSILLECTOMY    . TRANSURETHRAL RESECTION OF PROSTATE  01/2011   Dr. at [facility] in [place]    Family History: Family History  Problem Relation Age of Onset  . Breast cancer Sister   . Breast cancer Mother   . Heart disease Mother   . Diabetes Sister   . Bone cancer Father   . Heart disease Sister   . Heart disease Daughter   . Throat cancer Brother   .  Stomach cancer Brother   . Colon cancer Neg Hx     Social History:   reports that he has never smoked. He has never used smokeless tobacco. He reports previous alcohol use. He reports previous drug use.  Medications: Medications Prior to Admission  Medication Sig Dispense Refill  . amLODipine (NORVASC) 10 MG tablet Take 10 mg by mouth daily.    Marland Kitchen atorvastatin (LIPITOR) 40 MG tablet Take 40 mg by mouth daily at 6 PM.     . CARAFATE 1 GM/10ML suspension Take 1 g by mouth 4 (four) times daily.     . [employer] Fiber Gummies 2 g CHEW Chew by mouth.    . dicyclomine (BENTYL) 10 MG capsule TAKE 1 CAPSULE (10 MG TOTAL) BY MOUTH 3 (THREE) TIMES DAILY BEFORE MEALS. 270 capsule 1  . dronabinol (MARINOL) 2.5 MG capsule Take 2.5 mg by mouth.    . gabapentin (NEURONTIN) 300 MG capsule TAKE 1 CAPSULE BY MOUTH THREE TIMES A DAY  (Patient taking differently: Take 300 mg by mouth 3 (three) times daily.) 270 capsule 1  . glimepiride (AMARYL) 2 MG tablet Take 2 mg by mouth daily with breakfast.    . glipiZIDE (GLUCOTROL) 5 MG tablet Take 5 mg by mouth.    . hydrocortisone 2.5 % cream Apply topically.    . lactulose (CHRONULAC) 10 GM/15ML solution Take 30 mLs (20 g total) by mouth daily. 946 mL 1  . latanoprost (XALATAN) 0.005 % ophthalmic solution 1 drop at bedtime.    Marland Kitchen linaclotide (LINZESS) 145 MCG CAPS capsule Take by mouth.    . losartan (COZAAR) 50 MG tablet Take 50 mg by mouth daily.    Marland Kitchen LUMIGAN 0.01 % SOLN SMARTSIG:In Eye(s)    . metFORMIN (GLUCOPHAGE) 500 MG tablet Take 500 mg by mouth 2 (two) times daily.    . metoprolol succinate (TOPROL-XL) 25 MG 24 hr tablet Take 25 mg by mouth daily.    . mirabegron ER (MYRBETRIQ) 50 MG TB24 tablet Take 50 mg by mouth daily.    Marland Kitchen morphine (MS CONTIN) 30 MG 12 hr tablet Take 30 mg by mouth 2 (two) times daily.    Marland Kitchen omeprazole (PRILOSEC) 40 MG capsule TAKE 1 CAPSULE BY MOUTH TWICE A DAY 180 capsule 0  . ondansetron (ZOFRAN) 8 MG tablet TAKE 1 TABLET(8 MG) BY MOUTH TWICE DAILY AS NEEDED FOR NAUSEA OR VOMITING 30 tablet 3  . oxyCODONE-acetaminophen (PERCOCET) 10-325 MG tablet Take 1 tablet by mouth every 6 (six) hours as needed for pain.    . pioglitazone (ACTOS) 30 MG tablet Take 30 mg by mouth daily.    . prochlorperazine (COMPAZINE) 10 MG tablet take 1 tablet by mouth every 6 hours if needed for nausea and vomiting 30 tablet 1  . RESTASIS 0.05 % ophthalmic emulsion Place 1 drop into both eyes daily.     . SUMAtriptan (IMITREX) 100 MG tablet Take 100 mg by mouth daily.    Marland Kitchen topiramate (TOPAMAX) 25 MG capsule Take 25 mg by mouth 2 (two) times daily.    Marland Kitchen torsemide (DEMADEX) 20 MG tablet Take 40 mg by mouth daily.     . TRULICITY 8.41 LK/4.4WN SOPN Inject 0.75 mg into the skin every Saturday.     . Vitamin D, Ergocalciferol, (DRISDOL) 50000 UNITS CAPS capsule Take 50,000 Units  by mouth every Saturday.       Results for orders placed or performed during the hospital encounter of 05/09/20 (from the past 48 hour(s))  Glucose, capillary     Status: Abnormal   Collection Time: 05/09/20  7:16 AM  Result Value Ref Range   Glucose-Capillary 100 (H) 70 - 99 mg/dL    Comment: Glucose reference range applies only to samples taken after fasting for at least 8 hours.   *Note: Due to a large number of results and/or encounters for the requested time period, some results have not been displayed. A complete set of results can be found in Results Review.    No results found.   A comprehensive review of systems was negative.  Blood pressure 128/63, pulse 74, temperature 98.8 F (37.1 C), temperature source Oral, resp. rate 20, height _0  (1.803 m), weight 85.7 kg, SpO2 99 %.  General appearance: alert, cooperative and appears stated age Head: Normocephalic, without obvious abnormality, atraumatic Neck: supple, symmetrical, trachea midline Cardio: regular rate and rhythm Resp: clear to auscultation bilaterally Extremities: Intact sensation and capillary refill all digits.  +epl/fpl/io.  No wounds.  Pulses: 2+ and symmetric Skin: Skin color, texture, turgor normal. No rashes or lesions Neurologic: Grossly normal Incision/Wound: none  Assessment/Plan Right long finger mass.  Non operative and operative treatment options have been discussed with the patient and patient wishes to proceed with operative treatment. Risks, benefits, and alternatives of surgery have been discussed and the patient agrees with the plan of care.   Leanora Cover 05/09/2020, 9:47 AM

## 2020-05-09 NOTE — Anesthesia Procedure Notes (Signed)
Procedure Name: Intubation Date/Time: 05/09/2020 10:21 AM Performed by: Collier Bullock, CRNA Pre-anesthesia Checklist: Patient identified, Emergency Drugs available, Suction available and Patient being monitored Patient Re-evaluated:Patient Re-evaluated prior to induction Oxygen Delivery Method: Circle system utilized Preoxygenation: Pre-oxygenation with 100% oxygen Induction Type: IV induction Laryngoscope Size: Mac and 4 Grade View: Grade II Tube type: Oral Tube size: 7.0 mm Number of attempts: 1 Airway Equipment and Method: Patient positioned with wedge pillow Placement Confirmation: ETT inserted through vocal cords under direct vision,  positive ETCO2 and breath sounds checked- equal and bilateral Secured at: 23 cm Tube secured with: Tape Dental Injury: Teeth and Oropharynx as per pre-operative assessment  Difficulty Due To: Difficulty was anticipated and Difficult Airway- due to reduced neck mobility

## 2020-05-09 NOTE — Anesthesia Preprocedure Evaluation (Signed)
Anesthesia Evaluation  Patient identified by MRN, date of birth, ID band Patient awake    Reviewed: Allergy & Precautions, NPO status , Patient's Chart, lab work & pertinent test results  Airway Mallampati: III  TM Distance: >3 FB Neck ROM: Full    Dental no notable dental hx.    Pulmonary neg pulmonary ROS,    Pulmonary exam normal breath sounds clear to auscultation       Cardiovascular hypertension, Pt. on home beta blockers and Pt. on medications Normal cardiovascular exam Rhythm:Regular Rate:Normal  ECG: NSR, rate 77  ECHO: 1. The left ventricle has normal systolic function with an ejection fraction of 60-65%. The cavity size was normal. There is moderate concentric left ventricular hypertrophy. Left ventricular diastolic parameters were normal. No evidence of left  ventricular regional wall motion abnormalities. GLS average normal -17.3% 2. The right ventricle has normal systolic function. The cavity was normal. There is no increase in right ventricular wall thickness. 3. No evidence of mitral valve stenosis. 4. No stenosis of the aortic valve.   Neuro/Psych  Headaches, PSYCHIATRIC DISORDERS Anxiety Diabetic neuropathy  Neuromuscular disease    GI/Hepatic GERD  Medicated and Controlled,(+)     substance abuse  ,   Endo/Other  diabetes, Oral Hypoglycemic AgentsMultiple myeloma  Renal/GU Renal disease     Musculoskeletal  (+) Fibromyalgia -, narcotic dependent  Abdominal   Peds  Hematology  (+) anemia , HLD   Anesthesia Other Findings   Reproductive/Obstetrics                             Anesthesia Physical  Anesthesia Plan  ASA: III  Anesthesia Plan: General   Post-op Pain Management:    Induction: Intravenous  PONV Risk Score and Plan: 2 and Ondansetron, Midazolam and Treatment may vary due to age or medical condition  Airway Management Planned: LMA  Additional  Equipment:   Intra-op Plan:   Post-operative Plan: Extubation in OR  Informed Consent: I have reviewed the patients History and Physical, chart, labs and discussed the procedure including the risks, benefits and alternatives for the proposed anesthesia with the patient or authorized representative who has indicated his/her understanding and acceptance.     Dental advisory given  Plan Discussed with: CRNA  Anesthesia Plan Comments:         Anesthesia Quick Evaluation

## 2020-05-09 NOTE — Discharge Instructions (Addendum)

## 2020-05-09 NOTE — Op Note (Addendum)
NAME: [name] MEDICAL RECORD NO: [id number] DATE OF BIRTH: [birth year] FACILITY: [facility] LOCATION: [facility] PHYSICIAN: Tennis Must, MD   OPERATIVE REPORT   DATE OF PROCEDURE: 05/09/20    PREOPERATIVE DIAGNOSIS:   Right long finger mass   POSTOPERATIVE DIAGNOSIS:   Right long finger mass/skin lesion   PROCEDURE:   Excision of right long finger skin lesion 8 mm   SURGEON:  Leanora Cover, M.D.   ASSISTANT: none   ANESTHESIA:  General   INTRAVENOUS FLUIDS:  Per anesthesia flow sheet.   ESTIMATED BLOOD LOSS:  Minimal.   COMPLICATIONS:  None.   SPECIMENS:   Right long finger mass to pathology   TOURNIQUET TIME:    Total Tourniquet Time Documented: Upper Arm (Right) - 16 minutes Total: Upper Arm (Right) - 16 minutes    DISPOSITION:  Stable to PACU.   INDICATIONS: 70 year old male has noted a mass of his right long finger over the past 3 months.  It is bothersome and painful.  He wishes to have it removed. Risks, benefits and alternatives of surgery were discussed including the risks of blood loss, infection, damage to nerves, vessels, tendons, ligaments, bone for surgery, need for additional surgery, complications with wound healing, continued pain, stiffness, recurrence of mass.  He voiced understanding of these risks and elected to proceed.  OPERATIVE COURSE:  After being identified preoperatively by myself,  the patient and I agreed on the procedure and site of the procedure.  The surgical site was marked.  Surgical consent had been signed. He was given IV antibiotics as preoperative antibiotic prophylaxis. He was transferred to the operating room and placed on the operating table in supine position with the Right upper extremity on an arm board.  General anesthesia was induced by the anesthesiologist.  Right upper extremity was prepped and draped in normal sterile orthopedic fashion.  A surgical pause was performed between the surgeons, anesthesia, and  operating room staff and all were in agreement as to the patient, procedure, and site of procedure.  Tourniquet at the proximal aspect of the extremity was inflated to 250 mmHg after exsanguination of the arm with an Esmarch bandage.    Incision was made at the dorsal edge of the mass.  This was carried in subcutaneous tissues by spreading technique.  The mass was noted to be involved with the skin.  This was not able to be excised without removal of portion of the skin.  The subcutaneous tissues were thickened.  They were carefully spread.  The mass was ellipsed out.  It measured approximately 8 mm in size of total excision.  It was felt that the entirety of the mass was removed.  It was sent to pathology for examination.  The wound was copiously irrigated with sterile saline.  It was able to be closed primarily.  5-0 Monocryl suture was used in horizontal mattress fashion to reapproximate the skin edges.  The wound was dressed with sterile Xeroform 4 x 4's and wrapped with a Coban dressing lightly.  An AlumaFoam splint was placed and wrapped light with Coban dressing.  Digital block of been performed with quarter percent plain Marcaine to aid in postoperative analgesia.  The tourniquet was deflated at 16 minutes.  Fingertips were pink with brisk capillary refill after deflation of tourniquet.  The operative  drapes were broken down.  The patient was awoken from anesthesia safely.  He was transferred back to the stretcher and taken to PACU in stable condition.  I will see him back in the office in 1 week for postoperative followup.    He is already on a pain regimen.  I will give him a prescription for oxycodone 5 mg 1 p.o. every 6 hours as needed pain dispense #15 for breakthrough pain.   Leanora Cover, MD Electronically signed, 05/09/20

## 2020-05-10 ENCOUNTER — Encounter (HOSPITAL_BASED_OUTPATIENT_CLINIC_OR_DEPARTMENT_OTHER): Payer: Self-pay | Admitting: Orthopedic Surgery

## 2020-05-10 LAB — SURGICAL PATHOLOGY

## 2020-05-10 NOTE — Anesthesia Postprocedure Evaluation (Signed)
Anesthesia Post Note  Patient: [name]  Procedure(s) Performed: RIGHT LONG FINGER EXCISION MASS, (Right Finger)     Patient location during evaluation: PACU Anesthesia Type: General Level of consciousness: awake and alert Pain management: pain level controlled Vital Signs Assessment: post-procedure vital signs reviewed and stable Respiratory status: spontaneous breathing, nonlabored ventilation and respiratory function stable Cardiovascular status: blood pressure returned to baseline and stable Postop Assessment: no apparent nausea or vomiting Anesthetic complications: no   No complications documented.  Last Vitals:  Vitals:   05/09/20 1200 05/09/20 1215  BP: 116/71 118/68  Pulse: 65 74  Resp: 13 18  Temp:  (!) 36.3 C  SpO2: 95% 95%    Last Pain:  Vitals:   05/09/20 1215  TempSrc:   PainSc: 0-No pain   Pain Goal: Patients Stated Pain Goal: 8 (05/09/20 0720)                 Lynda Rainwater

## 2020-05-21 ENCOUNTER — Encounter: Payer: Self-pay | Admitting: Hematology & Oncology

## 2020-05-21 ENCOUNTER — Telehealth: Payer: Self-pay

## 2020-05-21 ENCOUNTER — Inpatient Hospital Stay: Payer: 59

## 2020-05-21 ENCOUNTER — Other Ambulatory Visit: Payer: Self-pay

## 2020-05-21 ENCOUNTER — Inpatient Hospital Stay (HOSPITAL_BASED_OUTPATIENT_CLINIC_OR_DEPARTMENT_OTHER): Payer: 59 | Admitting: Hematology & Oncology

## 2020-05-21 VITALS — BP 123/61 | HR 72 | Temp 98.0°F | Resp 18 | Wt 186.4 lb

## 2020-05-21 DIAGNOSIS — D500 Iron deficiency anemia secondary to blood loss (chronic): Secondary | ICD-10-CM

## 2020-05-21 DIAGNOSIS — C9000 Multiple myeloma not having achieved remission: Secondary | ICD-10-CM

## 2020-05-21 DIAGNOSIS — Z299 Encounter for prophylactic measures, unspecified: Secondary | ICD-10-CM

## 2020-05-21 DIAGNOSIS — E349 Endocrine disorder, unspecified: Secondary | ICD-10-CM

## 2020-05-21 DIAGNOSIS — D631 Anemia in chronic kidney disease: Secondary | ICD-10-CM

## 2020-05-21 DIAGNOSIS — C9002 Multiple myeloma in relapse: Secondary | ICD-10-CM | POA: Diagnosis not present

## 2020-05-21 DIAGNOSIS — Z5112 Encounter for antineoplastic immunotherapy: Secondary | ICD-10-CM | POA: Diagnosis not present

## 2020-05-21 LAB — CMP (CANCER CENTER ONLY)
ALT: 10 U/L (ref 0–44)
AST: 17 U/L (ref 15–41)
Albumin: 4.1 g/dL (ref 3.5–5.0)
Alkaline Phosphatase: 83 U/L (ref 38–126)
Anion gap: 7 (ref 5–15)
BUN: 25 mg/dL — ABNORMAL HIGH (ref 8–23)
CO2: 31 mmol/L (ref 22–32)
Calcium: 9 mg/dL (ref 8.9–10.3)
Chloride: 100 mmol/L (ref 98–111)
Creatinine: 2.46 mg/dL — ABNORMAL HIGH (ref 0.61–1.24)
GFR, Estimated: 27 mL/min — ABNORMAL LOW (ref >60)
Glucose, Bld: 151 mg/dL — ABNORMAL HIGH (ref 70–99)
Potassium: 4 mmol/L (ref 3.5–5.1)
Sodium: 138 mmol/L (ref 135–145)
Total Bilirubin: 0.4 mg/dL (ref 0.3–1.2)
Total Protein: 7 g/dL (ref 6.5–8.1)

## 2020-05-21 LAB — CBC WITH DIFFERENTIAL (CANCER CENTER ONLY)
Abs Immature Granulocytes: 0.02 10*3/uL (ref 0.00–0.07)
Basophils Absolute: 0.1 10*3/uL (ref 0.0–0.1)
Basophils Relative: 2 %
Eosinophils Absolute: 0.1 10*3/uL (ref 0.0–0.5)
Eosinophils Relative: 3 %
HCT: 31.1 % — ABNORMAL LOW (ref 39.0–52.0)
Hemoglobin: 10 g/dL — ABNORMAL LOW (ref 13.0–17.0)
Immature Granulocytes: 1 %
Lymphocytes Relative: 20 %
Lymphs Abs: 0.8 10*3/uL (ref 0.7–4.0)
MCH: 25.1 pg — ABNORMAL LOW (ref 26.0–34.0)
MCHC: 32.2 g/dL (ref 30.0–36.0)
MCV: 77.9 fL — ABNORMAL LOW (ref 80.0–100.0)
Monocytes Absolute: 0.6 10*3/uL (ref 0.1–1.0)
Monocytes Relative: 14 %
Neutro Abs: 2.5 10*3/uL (ref 1.7–7.7)
Neutrophils Relative %: 60 %
Platelet Count: 188 10*3/uL (ref 150–400)
RBC: 3.99 MIL/uL — ABNORMAL LOW (ref 4.22–5.81)
RDW: 16.6 % — ABNORMAL HIGH (ref 11.5–15.5)
WBC Count: 4.1 10*3/uL (ref 4.0–10.5)
nRBC: 0 % (ref 0.0–0.2)

## 2020-05-21 LAB — LACTATE DEHYDROGENASE: LDH: 195 U/L — ABNORMAL HIGH (ref 98–192)

## 2020-05-21 LAB — FERRITIN: Ferritin: 1830 ng/mL — ABNORMAL HIGH (ref 24–336)

## 2020-05-21 LAB — IRON AND TIBC
Iron: 82 ug/dL (ref 42–163)
Saturation Ratios: 33 % (ref 20–55)
TIBC: 246 ug/dL (ref 202–409)
UIBC: 164 ug/dL (ref 117–376)

## 2020-05-21 MED ORDER — SODIUM CHLORIDE 0.9% FLUSH
10.0000 mL | INTRAVENOUS | Status: DC | PRN
  Administered 2020-05-21: 10 mL
  Filled 2020-05-21: qty 10

## 2020-05-21 MED ORDER — SODIUM CHLORIDE 0.9 % IV SOLN
Freq: Once | INTRAVENOUS | Status: AC
  Filled 2020-05-21: qty 250

## 2020-05-21 MED ORDER — TESTOSTERONE CYPIONATE 200 MG/ML IM SOLN
INTRAMUSCULAR | Status: AC
  Filled 2020-05-21: qty 2

## 2020-05-21 MED ORDER — SODIUM CHLORIDE 0.9 % IV SOLN
300.0000 mg/m2 | Freq: Once | INTRAVENOUS | Status: AC
  Administered 2020-05-21: 620 mg via INTRAVENOUS
  Filled 2020-05-21: qty 31

## 2020-05-21 MED ORDER — DEXTROSE 5 % IV SOLN
29.0000 mg/m2 | Freq: Once | INTRAVENOUS | Status: AC
  Administered 2020-05-21: 60 mg via INTRAVENOUS
  Filled 2020-05-21: qty 30

## 2020-05-21 MED ORDER — EPOETIN ALFA-EPBX 40000 UNIT/ML IJ SOLN: 40000.0000 [IU] | Freq: Once | INTRAMUSCULAR | Status: DC

## 2020-05-21 MED ORDER — SODIUM CHLORIDE 0.9 % IV SOLN
10.0000 mg | Freq: Once | INTRAVENOUS | Status: AC
  Administered 2020-05-21: 10 mg via INTRAVENOUS
  Filled 2020-05-21: qty 10

## 2020-05-21 MED ORDER — HYDROMORPHONE HCL 1 MG/ML IJ SOLN
2.0000 mg | Freq: Once | INTRAMUSCULAR | Status: AC
Start: 2020-05-21 — End: 2020-05-21
  Administered 2020-05-21: 2 mg via INTRAVENOUS

## 2020-05-21 MED ORDER — HEPARIN SOD (PORK) LOCK FLUSH 100 UNIT/ML IV SOLN
500.0000 [IU] | Freq: Once | INTRAVENOUS | Status: AC | PRN
  Administered 2020-05-21: 500 [IU]
  Filled 2020-05-21: qty 5

## 2020-05-21 MED ORDER — PALONOSETRON HCL INJECTION 0.25 MG/5ML
INTRAVENOUS | Status: AC
  Filled 2020-05-21: qty 5

## 2020-05-21 MED ORDER — TESTOSTERONE CYPIONATE 200 MG/ML IM SOLN
400.0000 mg | Freq: Once | INTRAMUSCULAR | Status: AC
  Administered 2020-05-21: 400 mg via INTRAMUSCULAR

## 2020-05-21 MED ORDER — HYDROMORPHONE HCL 1 MG/ML IJ SOLN
INTRAMUSCULAR | Status: AC
  Filled 2020-05-21: qty 2

## 2020-05-21 MED ORDER — PALONOSETRON HCL INJECTION 0.25 MG/5ML
0.2500 mg | Freq: Once | INTRAVENOUS | Status: AC
  Administered 2020-05-21: 0.25 mg via INTRAVENOUS

## 2020-05-21 NOTE — Progress Notes (Signed)
Hematology and Oncology Follow Up Visit  [name] [id number] [birth year] 70 y.o. 05/21/2020   Principle Diagnosis:  IgG kappa myeloma  Anemia secondary to renal insufficiency  Intermittent iron - deficiency anemia  Hypotestosteronemia  Current Therapy: Aredia 60 mg IV q 3 months-- next dose 05/2020 Aranesp 300 mcg subcu as needed for hemoglobin less than 11 DepoTestosterone400 mg q4weeks IV iron as indicated Kyprolis/Cytoxan - s/p cycle#27   Interim History:  [name] is here today for follow-up and treatment.  He does feel a little tired.  He on occasion has very low energy.  We did give him testosterone injections.  His last testosterone level about 4 weeks ago was 370.  He is bothered by his arthritis.  He has bad inflammatory arthritis.  He is followed by rheumatology for this.  He does have fibromyalgia.  His myeloma is still under very good control.  There is no monoclonal spike in his blood.  His last IgG level was 1047 mg/dL.  His Kappa light chain was 4.3 mg/dL.  This is all stable.  His appetite is doing okay.  He has had no problems with nausea or vomiting.  He has had no obvious change in bowel or bladder habits.  He has occasional leg swelling.  He does have diabetes.  We may have to move his Aredia up 1 month.  The radio really does seem to help his bony issues.  We will see about giving a dose next month.  Overall, his performance status is ECOG 1.      Allergies as of 05/21/2020      Reactions   Iodinated Diagnostic Agents Rash   Patient states he was instructed not to take IV contrast.  In 2008 he had an unknown reaction, and was told not to take it again.  He was also told not to take it due to his kidneys.   Iodine Anxiety, Rash, Other (See Comments)   Didn't feel right "instructed not to take per MD--something with his port"      Medication List       Accurate as of May 21, 2020  9:58 AM. If you have any questions, ask  your nurse or doctor.        STOP taking these medications   glimepiride 2 MG tablet Commonly known as: AMARYL Stopped by: Volanda Napoleon, MD   glipiZIDE 5 MG tablet Commonly known as: GLUCOTROL Stopped by: Volanda Napoleon, MD   metFORMIN 500 MG tablet Commonly known as: GLUCOPHAGE Stopped by: Volanda Napoleon, MD     TAKE these medications   amLODipine 10 MG tablet Commonly known as: NORVASC Take 10 mg by mouth daily.   atorvastatin 40 MG tablet Commonly known as: LIPITOR Take 40 mg by mouth daily at 6 PM.   Carafate 1 GM/10ML suspension Generic drug: sucralfate Take 1 g by mouth 4 (four) times daily.   carvedilol 25 MG tablet Commonly known as: COREG Take 25 mg by mouth 2 (two) times daily.   [employer] Fiber Gummies 2 g Chew Chew by mouth daily.   dicyclomine 10 MG capsule Commonly known as: BENTYL TAKE 1 CAPSULE (10 MG TOTAL) BY MOUTH 3 (THREE) TIMES DAILY BEFORE MEALS.   dronabinol 2.5 MG capsule Commonly known as: MARINOL Take 2.5 mg by mouth 2 (two) times daily before lunch and supper.   gabapentin 300 MG capsule Commonly known as: NEURONTIN TAKE 1 CAPSULE BY MOUTH THREE TIMES A DAY What changed:   how  much to take  how to take this  when to take this  additional instructions   hydrocortisone 2.5 % cream Apply topically.   lactulose 10 GM/15ML solution Commonly known as: CHRONULAC Take 30 mLs (20 g total) by mouth daily.   latanoprost 0.005 % ophthalmic solution Commonly known as: XALATAN 1 drop at bedtime.   linaclotide 145 MCG Caps capsule Commonly known as: LINZESS Take by mouth daily as needed.   losartan 50 MG tablet Commonly known as: COZAAR Take 50 mg by mouth daily.   Lumigan 0.01 % Soln Generic drug: bimatoprost Place 1 drop into both eyes 3 (three) times daily.   metoprolol succinate 25 MG 24 hr tablet Commonly known as: TOPROL-XL Take 25 mg by mouth daily.   mirabegron ER 50 MG Tb24 tablet Commonly known as:  MYRBETRIQ Take 50 mg by mouth daily.   morphine 30 MG 12 hr tablet Commonly known as: MS CONTIN Take 30 mg by mouth 2 (two) times daily.   omeprazole 40 MG capsule Commonly known as: PRILOSEC TAKE 1 CAPSULE BY MOUTH TWICE A DAY   ondansetron 8 MG tablet Commonly known as: ZOFRAN TAKE 1 TABLET(8 MG) BY MOUTH TWICE DAILY AS NEEDED FOR NAUSEA OR VOMITING   oxyCODONE 5 MG immediate release tablet Commonly known as: Roxicodone 1 tab PO q6 hours prn pain   oxyCODONE-acetaminophen 10-325 MG tablet Commonly known as: PERCOCET Take 1 tablet by mouth every 6 (six) hours as needed for pain.   pioglitazone 30 MG tablet Commonly known as: ACTOS Take 30 mg by mouth daily.   prochlorperazine 10 MG tablet Commonly known as: COMPAZINE take 1 tablet by mouth every 6 hours if needed for nausea and vomiting   Restasis 0.05 % ophthalmic emulsion Generic drug: cycloSPORINE Place 1 drop into both eyes daily.   SUMAtriptan 100 MG tablet Commonly known as: IMITREX Take 100 mg by mouth daily.   tadalafil 10 MG tablet Commonly known as: CIALIS Take by mouth.   topiramate 25 MG capsule Commonly known as: TOPAMAX Take 25 mg by mouth 2 (two) times daily.   torsemide 20 MG tablet Commonly known as: DEMADEX Take 40 mg by mouth daily.   Trulicity 4.00 QQ/7.6PP Sopn Generic drug: Dulaglutide Inject 0.75 mg into the skin every Saturday.   Vitamin D (Ergocalciferol) 1.25 MG (50000 UNIT) Caps capsule Commonly known as: DRISDOL Take 50,000 Units by mouth every Saturday.       Allergies:  Allergies  Allergen Reactions  . Iodinated Diagnostic Agents Rash    Patient states he was instructed not to take IV contrast.  In 2008 he had an unknown reaction, and was told not to take it again.  He was also told not to take it due to his kidneys.  . Iodine Anxiety, Rash and Other (See Comments)    Didn't feel right "instructed not to take per MD--something with his port"     Past Medical  History, Surgical history, Social history, and Family History were reviewed and updated.  Review of Systems: Review of Systems  Constitutional: Positive for malaise/fatigue.  HENT: Negative.   Eyes: Negative.   Respiratory: Negative.   Cardiovascular: Negative.   Gastrointestinal: Positive for heartburn.  Genitourinary: Negative.   Musculoskeletal: Positive for joint pain and myalgias.  Skin: Negative.   Neurological: Positive for tingling.  Endo/Heme/Allergies: Negative.   Psychiatric/Behavioral: Negative.      Physical Exam:  weight is 186 lb 6.4 oz (84.6 kg). His oral temperature is 98 F (  36.7 C). His blood pressure is 123/61 and his pulse is 72. His respiration is 18 and oxygen saturation is 100%.   Wt Readings from Last 3 Encounters:  05/21/20 186 lb 6.4 oz (84.6 kg)  05/09/20 188 lb 15 oz (85.7 kg)  04/23/20 189 lb (85.7 kg)    Physical Exam Vitals reviewed.  HENT:     Head: Normocephalic and atraumatic.  Eyes:     Pupils: Pupils are equal, round, and reactive to light.  Cardiovascular:     Rate and Rhythm: Normal rate and regular rhythm.     Heart sounds: Normal heart sounds.  Pulmonary:     Effort: Pulmonary effort is normal.     Breath sounds: Normal breath sounds.  Abdominal:     General: Bowel sounds are normal.     Palpations: Abdomen is soft.  Musculoskeletal:        General: No tenderness or deformity. Normal range of motion.     Cervical back: Normal range of motion.  Lymphadenopathy:     Cervical: No cervical adenopathy.  Skin:    General: Skin is warm and dry.     Findings: No erythema or rash.  Neurological:     Mental Status: He is alert and oriented to person, place, and time.  Psychiatric:        Behavior: Behavior normal.        Thought Content: Thought content normal.        Judgment: Judgment normal.      Lab Results  Component Value Date   WBC 4.1 05/21/2020   HGB 10.0 (L) 05/21/2020   HCT 31.1 (L) 05/21/2020   MCV 77.9 (L)  05/21/2020   PLT 188 05/21/2020   Lab Results  Component Value Date   FERRITIN 1,995 (H) 04/23/2020   IRON 83 04/23/2020   TIBC 253 04/23/2020   UIBC 170 04/23/2020   IRONPCTSAT 33 04/23/2020   Lab Results  Component Value Date   RETICCTPCT 1.0 02/26/2020   RBC 3.99 (L) 05/21/2020   RETICCTABS 27.5 03/21/2014   Lab Results  Component Value Date   KPAFRELGTCHN 42.7 (H) 04/23/2020   LAMBDASER 23.7 04/23/2020   KAPLAMBRATIO 1.80 (H) 04/23/2020   Lab Results  Component Value Date   IGGSERUM 1,047 04/23/2020   IGA 235 04/23/2020   IGMSERUM 29 04/23/2020   Lab Results  Component Value Date   TOTALPROTELP 7.0 04/23/2020   ALBUMINELP 3.7 04/23/2020   A1GS 0.3 04/23/2020   A2GS 1.0 04/23/2020   BETS 0.9 04/23/2020   BETA2SER 0.5 11/07/2014   GAMS 1.1 04/23/2020   MSPIKE Not Observed 04/23/2020   SPEI Comment 01/29/2020     Chemistry      Component Value Date/Time   NA 138 05/21/2020 0920   NA 143 02/22/2017 0744   K 4.0 05/21/2020 0920   K 4.0 02/22/2017 0744   CL 100 05/21/2020 0920   CL 106 02/22/2017 0744   CO2 31 05/21/2020 0920   CO2 27 02/22/2017 0744   BUN 25 (H) 05/21/2020 0920   BUN 26 (H) 02/22/2017 0744   CREATININE 2.46 (H) 05/21/2020 0920   CREATININE 2.3 (H) 02/22/2017 0744      Component Value Date/Time   CALCIUM 9.0 05/21/2020 0920   CALCIUM 9.3 02/22/2017 0744   ALKPHOS 83 05/21/2020 0920   ALKPHOS 80 02/22/2017 0744   AST 17 05/21/2020 0920   ALT 10 05/21/2020 0920   ALT 22 02/22/2017 0744   BILITOT 0.4 05/21/2020 0920  Impression and Plan: [name] is a very pleasant 70 yo African American gentleman with IgG kappa myeloma.  We will go ahead with chemotherapy today.  I just feel bad that he has all his other health issues.  I just wish there is some that we could do try to help him out with all these.  As always, we will plan to get him back in another month.  Volanda Napoleon, MD 3/29/20229:58 AM

## 2020-05-21 NOTE — Telephone Encounter (Signed)
Called pt with his appts for 4/26 per los   Milina Pagett

## 2020-05-21 NOTE — Progress Notes (Signed)
Reviewed labwork with Dr [name] ok to treat today with Creatinine of 2.49

## 2020-05-21 NOTE — Patient Instructions (Signed)
[facility] Discharge Instructions for Patients Receiving Chemotherapy  Today you received the following chemotherapy agents Kyprolis, Cytoxan  To help prevent nausea and vomiting after your treatment, we encourage you to take your nausea medication    If you develop nausea and vomiting that is not controlled by your nausea medication, call the clinic.   BELOW ARE SYMPTOMS THAT SHOULD BE REPORTED IMMEDIATELY:  *FEVER GREATER THAN 100.5 F  *CHILLS WITH OR WITHOUT FEVER  NAUSEA AND VOMITING THAT IS NOT CONTROLLED WITH YOUR NAUSEA MEDICATION  *UNUSUAL SHORTNESS OF BREATH  *UNUSUAL BRUISING OR BLEEDING  TENDERNESS IN MOUTH AND THROAT WITH OR WITHOUT PRESENCE OF ULCERS  *URINARY PROBLEMS  *BOWEL PROBLEMS  UNUSUAL RASH Items with * indicate a potential emergency and should be followed up as soon as possible.  Feel free to call the clinic should you have any questions or concerns. The clinic phone number is [phone].  Please show the CHEMO ALERT CARD at check-in to the Emergency Department and triage nurse.   

## 2020-05-21 NOTE — Patient Instructions (Signed)

## 2020-05-22 LAB — KAPPA/LAMBDA LIGHT CHAINS
Kappa free light chain: 42.3 mg/L — ABNORMAL HIGH (ref 3.3–19.4)
Kappa, lambda light chain ratio: 1.76 — ABNORMAL HIGH (ref 0.26–1.65)
Lambda free light chains: 24.1 mg/L (ref 5.7–26.3)

## 2020-05-22 LAB — IGG, IGA, IGM
IgA: 217 mg/dL (ref 61–437)
IgG (Immunoglobin G), Serum: 1030 mg/dL (ref 603–1613)
IgM (Immunoglobulin M), Srm: 28 mg/dL (ref 20–172)

## 2020-05-22 LAB — TESTOSTERONE: Testosterone: 232 ng/dL — ABNORMAL LOW (ref 264–916)

## 2020-05-23 LAB — PROTEIN ELECTROPHORESIS, SERUM
A/G Ratio: 1.2 (ref 0.7–1.7)
Albumin ELP: 3.6 g/dL (ref 2.9–4.4)
Alpha-1-Globulin: 0.2 g/dL (ref 0.0–0.4)
Alpha-2-Globulin: 0.9 g/dL (ref 0.4–1.0)
Beta Globulin: 1 g/dL (ref 0.7–1.3)
Gamma Globulin: 1 g/dL (ref 0.4–1.8)
Globulin, Total: 3.1 g/dL (ref 2.2–3.9)
Total Protein ELP: 6.7 g/dL (ref 6.0–8.5)

## 2020-06-11 ENCOUNTER — Ambulatory Visit: Payer: 59 | Admitting: Hematology & Oncology

## 2020-06-11 ENCOUNTER — Other Ambulatory Visit: Payer: 59

## 2020-06-11 ENCOUNTER — Ambulatory Visit: Payer: 59

## 2020-06-12 DIAGNOSIS — M19029 Primary osteoarthritis, unspecified elbow: Secondary | ICD-10-CM | POA: Insufficient documentation

## 2020-06-18 ENCOUNTER — Ambulatory Visit: Payer: 59 | Admitting: Hematology & Oncology

## 2020-06-18 ENCOUNTER — Encounter: Payer: Self-pay | Admitting: Hematology & Oncology

## 2020-06-18 ENCOUNTER — Other Ambulatory Visit: Payer: 59

## 2020-06-18 ENCOUNTER — Inpatient Hospital Stay: Payer: 59

## 2020-06-18 ENCOUNTER — Telehealth: Payer: Self-pay

## 2020-06-18 ENCOUNTER — Ambulatory Visit: Payer: 59

## 2020-06-18 ENCOUNTER — Other Ambulatory Visit: Payer: Self-pay

## 2020-06-18 ENCOUNTER — Inpatient Hospital Stay (HOSPITAL_BASED_OUTPATIENT_CLINIC_OR_DEPARTMENT_OTHER): Payer: 59 | Admitting: Hematology & Oncology

## 2020-06-18 ENCOUNTER — Inpatient Hospital Stay: Payer: 59 | Attending: Hematology & Oncology

## 2020-06-18 VITALS — BP 146/65 | HR 72 | Temp 98.5°F | Resp 18 | Wt 178.5 lb

## 2020-06-18 DIAGNOSIS — E291 Testicular hypofunction: Secondary | ICD-10-CM | POA: Insufficient documentation

## 2020-06-18 DIAGNOSIS — M797 Fibromyalgia: Secondary | ICD-10-CM | POA: Insufficient documentation

## 2020-06-18 DIAGNOSIS — C9000 Multiple myeloma not having achieved remission: Secondary | ICD-10-CM | POA: Diagnosis present

## 2020-06-18 DIAGNOSIS — Z5111 Encounter for antineoplastic chemotherapy: Secondary | ICD-10-CM | POA: Insufficient documentation

## 2020-06-18 DIAGNOSIS — N189 Chronic kidney disease, unspecified: Secondary | ICD-10-CM | POA: Diagnosis not present

## 2020-06-18 DIAGNOSIS — C9001 Multiple myeloma in remission: Secondary | ICD-10-CM

## 2020-06-18 DIAGNOSIS — E349 Endocrine disorder, unspecified: Secondary | ICD-10-CM

## 2020-06-18 DIAGNOSIS — D500 Iron deficiency anemia secondary to blood loss (chronic): Secondary | ICD-10-CM

## 2020-06-18 DIAGNOSIS — Z299 Encounter for prophylactic measures, unspecified: Secondary | ICD-10-CM

## 2020-06-18 DIAGNOSIS — D631 Anemia in chronic kidney disease: Secondary | ICD-10-CM | POA: Diagnosis not present

## 2020-06-18 DIAGNOSIS — C9002 Multiple myeloma in relapse: Secondary | ICD-10-CM

## 2020-06-18 DIAGNOSIS — Z5112 Encounter for antineoplastic immunotherapy: Secondary | ICD-10-CM | POA: Diagnosis present

## 2020-06-18 DIAGNOSIS — M4712 Other spondylosis with myelopathy, cervical region: Secondary | ICD-10-CM

## 2020-06-18 LAB — CBC WITH DIFFERENTIAL (CANCER CENTER ONLY)
Abs Immature Granulocytes: 0.03 10*3/uL (ref 0.00–0.07)
Basophils Absolute: 0 10*3/uL (ref 0.0–0.1)
Basophils Relative: 1 %
Eosinophils Absolute: 0.1 10*3/uL (ref 0.0–0.5)
Eosinophils Relative: 2 %
HCT: 33.4 % — ABNORMAL LOW (ref 39.0–52.0)
Hemoglobin: 10.7 g/dL — ABNORMAL LOW (ref 13.0–17.0)
Immature Granulocytes: 1 %
Lymphocytes Relative: 14 %
Lymphs Abs: 0.7 10*3/uL (ref 0.7–4.0)
MCH: 25.4 pg — ABNORMAL LOW (ref 26.0–34.0)
MCHC: 32 g/dL (ref 30.0–36.0)
MCV: 79.1 fL — ABNORMAL LOW (ref 80.0–100.0)
Monocytes Absolute: 0.5 10*3/uL (ref 0.1–1.0)
Monocytes Relative: 10 %
Neutro Abs: 3.5 10*3/uL (ref 1.7–7.7)
Neutrophils Relative %: 72 %
Platelet Count: 158 10*3/uL (ref 150–400)
RBC: 4.22 MIL/uL (ref 4.22–5.81)
RDW: 17.9 % — ABNORMAL HIGH (ref 11.5–15.5)
WBC Count: 4.8 10*3/uL (ref 4.0–10.5)
nRBC: 0 % (ref 0.0–0.2)

## 2020-06-18 LAB — CMP (CANCER CENTER ONLY)
ALT: 14 U/L (ref 0–44)
AST: 15 U/L (ref 15–41)
Albumin: 4 g/dL (ref 3.5–5.0)
Alkaline Phosphatase: 68 U/L (ref 38–126)
Anion gap: 7 (ref 5–15)
BUN: 28 mg/dL — ABNORMAL HIGH (ref 8–23)
CO2: 29 mmol/L (ref 22–32)
Calcium: 9.1 mg/dL (ref 8.9–10.3)
Chloride: 103 mmol/L (ref 98–111)
Creatinine: 2.26 mg/dL — ABNORMAL HIGH (ref 0.61–1.24)
GFR, Estimated: 30 mL/min — ABNORMAL LOW (ref >60)
Glucose, Bld: 151 mg/dL — ABNORMAL HIGH (ref 70–99)
Potassium: 3.8 mmol/L (ref 3.5–5.1)
Sodium: 139 mmol/L (ref 135–145)
Total Bilirubin: 0.5 mg/dL (ref 0.3–1.2)
Total Protein: 7.1 g/dL (ref 6.5–8.1)

## 2020-06-18 LAB — IRON AND TIBC
Iron: 81 ug/dL (ref 42–163)
Saturation Ratios: 36 % (ref 20–55)
TIBC: 224 ug/dL (ref 202–409)
UIBC: 142 ug/dL (ref 117–376)

## 2020-06-18 LAB — LACTATE DEHYDROGENASE: LDH: 182 U/L (ref 98–192)

## 2020-06-18 LAB — RETICULOCYTES
Immature Retic Fract: 10.5 % (ref 2.3–15.9)
RBC.: 4.26 MIL/uL (ref 4.22–5.81)
Retic Count, Absolute: 35.4 10*3/uL (ref 19.0–186.0)
Retic Ct Pct: 0.8 % (ref 0.4–3.1)

## 2020-06-18 LAB — FERRITIN: Ferritin: 1618 ng/mL — ABNORMAL HIGH (ref 24–336)

## 2020-06-18 MED ORDER — TESTOSTERONE CYPIONATE 200 MG/ML IM SOLN
INTRAMUSCULAR | Status: AC
  Filled 2020-06-18: qty 1

## 2020-06-18 MED ORDER — TESTOSTERONE CYPIONATE 200 MG/ML IM SOLN
400.0000 mg | Freq: Once | INTRAMUSCULAR | Status: AC
Start: 2020-06-18 — End: 2020-06-18
  Administered 2020-06-18: 400 mg via INTRAMUSCULAR

## 2020-06-18 MED ORDER — HYDROMORPHONE HCL 1 MG/ML IJ SOLN
2.0000 mg | Freq: Once | INTRAMUSCULAR | Status: AC
  Administered 2020-06-18: 2 mg via INTRAVENOUS

## 2020-06-18 MED ORDER — SODIUM CHLORIDE 0.9 % IV SOLN
60.0000 mg | Freq: Once | INTRAVENOUS | Status: AC
  Administered 2020-06-18: 60 mg via INTRAVENOUS
  Filled 2020-06-18: qty 10

## 2020-06-18 MED ORDER — HEPARIN SOD (PORK) LOCK FLUSH 100 UNIT/ML IV SOLN
500.0000 [IU] | Freq: Once | INTRAVENOUS | Status: AC | PRN
  Administered 2020-06-18: 500 [IU]
  Filled 2020-06-18: qty 5

## 2020-06-18 MED ORDER — DEXTROSE 5 % IV SOLN
29.0000 mg/m2 | Freq: Once | INTRAVENOUS | Status: AC
  Administered 2020-06-18: 60 mg via INTRAVENOUS
  Filled 2020-06-18: qty 30

## 2020-06-18 MED ORDER — EPOETIN ALFA-EPBX 40000 UNIT/ML IJ SOLN: 40000.0000 [IU] | Freq: Once | INTRAMUSCULAR | Status: DC

## 2020-06-18 MED ORDER — SODIUM CHLORIDE 0.9 % IV SOLN
Freq: Once | INTRAVENOUS | Status: AC
  Filled 2020-06-18: qty 250

## 2020-06-18 MED ORDER — PALONOSETRON HCL INJECTION 0.25 MG/5ML
INTRAVENOUS | Status: AC
  Filled 2020-06-18: qty 5

## 2020-06-18 MED ORDER — HYDROMORPHONE HCL 1 MG/ML IJ SOLN
INTRAMUSCULAR | Status: AC
  Filled 2020-06-18: qty 2

## 2020-06-18 MED ORDER — SODIUM CHLORIDE 0.9 % IV SOLN
300.0000 mg/m2 | Freq: Once | INTRAVENOUS | Status: AC
  Administered 2020-06-18: 620 mg via INTRAVENOUS
  Filled 2020-06-18: qty 31

## 2020-06-18 MED ORDER — SODIUM CHLORIDE 0.9% FLUSH
10.0000 mL | INTRAVENOUS | Status: DC | PRN
  Administered 2020-06-18: 10 mL
  Filled 2020-06-18: qty 10

## 2020-06-18 MED ORDER — EPOETIN ALFA-EPBX 40000 UNIT/ML IJ SOLN
INTRAMUSCULAR | Status: AC
  Filled 2020-06-18: qty 1

## 2020-06-18 MED ORDER — SODIUM CHLORIDE 0.9 % IV SOLN
10.0000 mg | Freq: Once | INTRAVENOUS | Status: AC
  Administered 2020-06-18: 10 mg via INTRAVENOUS
  Filled 2020-06-18: qty 10

## 2020-06-18 MED ORDER — PALONOSETRON HCL INJECTION 0.25 MG/5ML
0.2500 mg | Freq: Once | INTRAVENOUS | Status: AC
  Administered 2020-06-18: 0.25 mg via INTRAVENOUS

## 2020-06-18 NOTE — Progress Notes (Addendum)
Hematology and Oncology Follow Up Visit  [name] [id number] [birth year] 71 y.o. 06/18/2020   Principle Diagnosis:  IgG kappa myeloma  Anemia secondary to renal insufficiency  Intermittent iron - deficiency anemia  Hypotestosteronemia  Current Therapy: Aredia 60 mg IV q 2 months-- next dose 07/2020 Aranesp 300 mcg subcu as needed for hemoglobin less than 11 DepoTestosterone400 mg q4weeks IV iron as indicated Kyprolis/Cytoxan - s/p cycle#28   Interim History:  [name] is here today for follow-up and treatment.  Unfortunately, he lost one of his sisters.  This was a few weeks ago.  Because of this, he did have a tough Easter.  He still has a arthritis that he is dealing with.  He has inflammatory arthritis.  He has fibromyalgia.  I think that rheumatology is trying to help out with all of this.  The last time that we saw him back in March, there is no monoclonal spike in his blood.  His IgG level was 1030 mg/dL.  The Kappa light chain was 4.2 mg/dL.  The iron studies back in March showed a ferritin of 1830 with an iron saturation of 33%.  He does get testosterone on occasion.  This does make him feel better.  Does give little more energy.  He has had no fever.  He has had no obvious change in bowel or bladder habits.  He has had no leg swelling.  He does have diabetes.  He has chronic renal insufficiency.  I think these have been doing relatively well.  Overall, I would have to say that his performance status is ECOG 1.     Allergies as of 06/18/2020      Reactions   Iodinated Diagnostic Agents Rash   Patient states he was instructed not to take IV contrast.  In 2008 he had an unknown reaction, and was told not to take it again.  He was also told not to take it due to his kidneys.   Iodine Anxiety, Rash, Other (See Comments)   Didn't feel right "instructed not to take per MD--something with his port"      Medication List       Accurate as of June 18, 2020  8:38 AM. If you have any questions, ask your nurse or doctor.        amLODipine 10 MG tablet Commonly known as: NORVASC Take 10 mg by mouth daily.   atorvastatin 40 MG tablet Commonly known as: LIPITOR Take 40 mg by mouth daily at 6 PM.   Carafate 1 GM/10ML suspension Generic drug: sucralfate Take 1 g by mouth 4 (four) times daily.   carvedilol 25 MG tablet Commonly known as: COREG Take 25 mg by mouth 2 (two) times daily.   [employer] Fiber Gummies 2 g Chew Chew by mouth daily.   dicyclomine 10 MG capsule Commonly known as: BENTYL TAKE 1 CAPSULE (10 MG TOTAL) BY MOUTH 3 (THREE) TIMES DAILY BEFORE MEALS.   dronabinol 2.5 MG capsule Commonly known as: MARINOL Take 2.5 mg by mouth 2 (two) times daily before lunch and supper.   gabapentin 300 MG capsule Commonly known as: NEURONTIN TAKE 1 CAPSULE BY MOUTH THREE TIMES A DAY What changed:   how much to take  how to take this  when to take this  additional instructions   hydrocortisone 2.5 % cream Apply topically.   lactulose 10 GM/15ML solution Commonly known as: CHRONULAC Take 30 mLs (20 g total) by mouth daily.   latanoprost 0.005 % ophthalmic  solution Commonly known as: XALATAN 1 drop at bedtime.   linaclotide 145 MCG Caps capsule Commonly known as: LINZESS Take by mouth daily as needed.   losartan 50 MG tablet Commonly known as: COZAAR Take 50 mg by mouth daily.   Lumigan 0.01 % Soln Generic drug: bimatoprost Place 1 drop into both eyes 3 (three) times daily.   metoprolol succinate 25 MG 24 hr tablet Commonly known as: TOPROL-XL Take 25 mg by mouth daily.   mirabegron ER 50 MG Tb24 tablet Commonly known as: MYRBETRIQ Take 50 mg by mouth daily.   morphine 30 MG 12 hr tablet Commonly known as: MS CONTIN Take 30 mg by mouth 2 (two) times daily.   omeprazole 40 MG capsule Commonly known as: PRILOSEC TAKE 1 CAPSULE BY MOUTH TWICE A DAY   ondansetron 8 MG tablet Commonly known as:  ZOFRAN TAKE 1 TABLET(8 MG) BY MOUTH TWICE DAILY AS NEEDED FOR NAUSEA OR VOMITING   oxyCODONE 5 MG immediate release tablet Commonly known as: Roxicodone 1 tab PO q6 hours prn pain   oxyCODONE-acetaminophen 10-325 MG tablet Commonly known as: PERCOCET Take 1 tablet by mouth every 6 (six) hours as needed for pain.   pioglitazone 30 MG tablet Commonly known as: ACTOS Take 30 mg by mouth daily.   prochlorperazine 10 MG tablet Commonly known as: COMPAZINE take 1 tablet by mouth every 6 hours if needed for nausea and vomiting   Restasis 0.05 % ophthalmic emulsion Generic drug: cycloSPORINE Place 1 drop into both eyes daily.   SUMAtriptan 100 MG tablet Commonly known as: IMITREX Take 100 mg by mouth daily.   tadalafil 10 MG tablet Commonly known as: CIALIS Take by mouth.   topiramate 25 MG capsule Commonly known as: TOPAMAX Take 25 mg by mouth 2 (two) times daily.   torsemide 20 MG tablet Commonly known as: DEMADEX Take 40 mg by mouth daily.   Trulicity 3.22 GU/5.4YH Sopn Generic drug: Dulaglutide Inject 0.75 mg into the skin every Saturday.   Vitamin D (Ergocalciferol) 1.25 MG (50000 UNIT) Caps capsule Commonly known as: DRISDOL Take 50,000 Units by mouth every Saturday.       Allergies:  Allergies  Allergen Reactions  . Iodinated Diagnostic Agents Rash    Patient states he was instructed not to take IV contrast.  In 2008 he had an unknown reaction, and was told not to take it again.  He was also told not to take it due to his kidneys.  . Iodine Anxiety, Rash and Other (See Comments)    Didn't feel right "instructed not to take per MD--something with his port"     Past Medical History, Surgical history, Social history, and Family History were reviewed and updated.  Review of Systems: Review of Systems  Constitutional: Positive for malaise/fatigue.  HENT: Negative.   Eyes: Negative.   Respiratory: Negative.   Cardiovascular: Negative.   Gastrointestinal:  Positive for heartburn.  Genitourinary: Negative.   Musculoskeletal: Positive for joint pain and myalgias.  Skin: Negative.   Neurological: Positive for tingling.  Endo/Heme/Allergies: Negative.   Psychiatric/Behavioral: Negative.      Physical Exam:  weight is 178 lb 8 oz (81 kg). His oral temperature is 98.5 F (36.9 C). His blood pressure is 146/65 (abnormal) and his pulse is 72. His respiration is 18 and oxygen saturation is 100%.   Wt Readings from Last 3 Encounters:  06/18/20 178 lb 8 oz (81 kg)  05/21/20 186 lb 6.4 oz (84.6 kg)  05/09/20 188  lb 15 oz (85.7 kg)    Physical Exam Vitals reviewed.  HENT:     Head: Normocephalic and atraumatic.  Eyes:     Pupils: Pupils are equal, round, and reactive to light.  Cardiovascular:     Rate and Rhythm: Normal rate and regular rhythm.     Heart sounds: Normal heart sounds.  Pulmonary:     Effort: Pulmonary effort is normal.     Breath sounds: Normal breath sounds.  Abdominal:     General: Bowel sounds are normal.     Palpations: Abdomen is soft.  Musculoskeletal:        General: No tenderness or deformity. Normal range of motion.     Cervical back: Normal range of motion.  Lymphadenopathy:     Cervical: No cervical adenopathy.  Skin:    General: Skin is warm and dry.     Findings: No erythema or rash.  Neurological:     Mental Status: He is alert and oriented to person, place, and time.  Psychiatric:        Behavior: Behavior normal.        Thought Content: Thought content normal.        Judgment: Judgment normal.      Lab Results  Component Value Date   WBC 4.8 06/18/2020   HGB 10.7 (L) 06/18/2020   HCT 33.4 (L) 06/18/2020   MCV 79.1 (L) 06/18/2020   PLT 158 06/18/2020   Lab Results  Component Value Date   FERRITIN 1,830 (H) 05/21/2020   IRON 82 05/21/2020   TIBC 246 05/21/2020   UIBC 164 05/21/2020   IRONPCTSAT 33 05/21/2020   Lab Results  Component Value Date   RETICCTPCT 0.8 06/18/2020   RBC  4.26 06/18/2020   RETICCTABS 27.5 03/21/2014   Lab Results  Component Value Date   KPAFRELGTCHN 42.3 (H) 05/21/2020   LAMBDASER 24.1 05/21/2020   KAPLAMBRATIO 1.76 (H) 05/21/2020   Lab Results  Component Value Date   IGGSERUM 1,030 05/21/2020   IGA 217 05/21/2020   IGMSERUM 28 05/21/2020   Lab Results  Component Value Date   TOTALPROTELP 6.7 05/21/2020   ALBUMINELP 3.6 05/21/2020   A1GS 0.2 05/21/2020   A2GS 0.9 05/21/2020   BETS 1.0 05/21/2020   BETA2SER 0.5 11/07/2014   GAMS 1.0 05/21/2020   MSPIKE Not Observed 05/21/2020   SPEI Comment 05/21/2020     Chemistry      Component Value Date/Time   NA 138 05/21/2020 0920   NA 143 02/22/2017 0744   K 4.0 05/21/2020 0920   K 4.0 02/22/2017 0744   CL 100 05/21/2020 0920   CL 106 02/22/2017 0744   CO2 31 05/21/2020 0920   CO2 27 02/22/2017 0744   BUN 25 (H) 05/21/2020 0920   BUN 26 (H) 02/22/2017 0744   CREATININE 2.46 (H) 05/21/2020 0920   CREATININE 2.3 (H) 02/22/2017 0744      Component Value Date/Time   CALCIUM 9.0 05/21/2020 0920   CALCIUM 9.3 02/22/2017 0744   ALKPHOS 83 05/21/2020 0920   ALKPHOS 80 02/22/2017 0744   AST 17 05/21/2020 0920   ALT 10 05/21/2020 0920   ALT 22 02/22/2017 0744   BILITOT 0.4 05/21/2020 0920       Impression and Plan: [name] is a very pleasant 70 yo African American gentleman with IgG kappa myeloma. So far, the myeloma is doing quite well.  His monoclonal studies are nice and low.  So far there is been no  monoclonal spike that was seen in his blood.  We will go ahead with his Aredia today.  I know this does seem to help him out.  I would like to think that his iron studies should be okay.  We will go ahead and give him a dose of Aranesp today.  He gets his testosterone.  We will plan to follow him up in another 4 weeks.   Volanda Napoleon, MD 4/26/20228:38 AM

## 2020-06-18 NOTE — Progress Notes (Signed)
Okay to treat with elevated SCr per Dr. [name]. Patient has chronic renal insufficiency.

## 2020-06-18 NOTE — Telephone Encounter (Signed)
appts made per los 4/26 and pt will gain sch in tx    [name]

## 2020-06-19 LAB — IGG, IGA, IGM
IgA: 257 mg/dL (ref 61–437)
IgG (Immunoglobin G), Serum: 1242 mg/dL (ref 603–1613)
IgM (Immunoglobulin M), Srm: 34 mg/dL (ref 20–172)

## 2020-06-19 LAB — KAPPA/LAMBDA LIGHT CHAINS
Kappa free light chain: 52.8 mg/L — ABNORMAL HIGH (ref 3.3–19.4)
Kappa, lambda light chain ratio: 1.89 — ABNORMAL HIGH (ref 0.26–1.65)
Lambda free light chains: 28 mg/L — ABNORMAL HIGH (ref 5.7–26.3)

## 2020-06-19 LAB — TESTOSTERONE: Testosterone: 225 ng/dL — ABNORMAL LOW (ref 264–916)

## 2020-06-20 LAB — PROTEIN ELECTROPHORESIS, SERUM, WITH REFLEX
A/G Ratio: 1.2 (ref 0.7–1.7)
Albumin ELP: 3.7 g/dL (ref 2.9–4.4)
Alpha-1-Globulin: 0.2 g/dL (ref 0.0–0.4)
Alpha-2-Globulin: 0.9 g/dL (ref 0.4–1.0)
Beta Globulin: 0.9 g/dL (ref 0.7–1.3)
Gamma Globulin: 1.2 g/dL (ref 0.4–1.8)
Globulin, Total: 3.2 g/dL (ref 2.2–3.9)
Total Protein ELP: 6.9 g/dL (ref 6.0–8.5)

## 2020-07-05 ENCOUNTER — Ambulatory Visit: Payer: 59 | Admitting: Podiatry

## 2020-07-16 ENCOUNTER — Inpatient Hospital Stay: Payer: 59

## 2020-07-16 ENCOUNTER — Inpatient Hospital Stay (HOSPITAL_BASED_OUTPATIENT_CLINIC_OR_DEPARTMENT_OTHER): Payer: 59 | Admitting: Hematology & Oncology

## 2020-07-16 ENCOUNTER — Other Ambulatory Visit: Payer: Self-pay

## 2020-07-16 ENCOUNTER — Encounter: Payer: Self-pay | Admitting: Hematology & Oncology

## 2020-07-16 ENCOUNTER — Inpatient Hospital Stay: Payer: 59 | Attending: Hematology & Oncology

## 2020-07-16 VITALS — BP 118/65 | HR 73 | Temp 98.5°F | Resp 18 | Wt 183.0 lb

## 2020-07-16 DIAGNOSIS — Z5112 Encounter for antineoplastic immunotherapy: Secondary | ICD-10-CM | POA: Diagnosis present

## 2020-07-16 DIAGNOSIS — Z5111 Encounter for antineoplastic chemotherapy: Secondary | ICD-10-CM | POA: Insufficient documentation

## 2020-07-16 DIAGNOSIS — C9001 Multiple myeloma in remission: Secondary | ICD-10-CM

## 2020-07-16 DIAGNOSIS — D631 Anemia in chronic kidney disease: Secondary | ICD-10-CM | POA: Diagnosis not present

## 2020-07-16 DIAGNOSIS — N189 Chronic kidney disease, unspecified: Secondary | ICD-10-CM | POA: Diagnosis not present

## 2020-07-16 DIAGNOSIS — C9000 Multiple myeloma not having achieved remission: Secondary | ICD-10-CM | POA: Insufficient documentation

## 2020-07-16 DIAGNOSIS — E291 Testicular hypofunction: Secondary | ICD-10-CM | POA: Diagnosis not present

## 2020-07-16 DIAGNOSIS — E349 Endocrine disorder, unspecified: Secondary | ICD-10-CM

## 2020-07-16 DIAGNOSIS — D500 Iron deficiency anemia secondary to blood loss (chronic): Secondary | ICD-10-CM

## 2020-07-16 DIAGNOSIS — Z299 Encounter for prophylactic measures, unspecified: Secondary | ICD-10-CM

## 2020-07-16 LAB — CMP (CANCER CENTER ONLY)
ALT: 17 U/L (ref 0–44)
AST: 19 U/L (ref 15–41)
Albumin: 4.3 g/dL (ref 3.5–5.0)
Alkaline Phosphatase: 72 U/L (ref 38–126)
Anion gap: 7 (ref 5–15)
BUN: 34 mg/dL — ABNORMAL HIGH (ref 8–23)
CO2: 34 mmol/L — ABNORMAL HIGH (ref 22–32)
Calcium: 9.6 mg/dL (ref 8.9–10.3)
Chloride: 97 mmol/L — ABNORMAL LOW (ref 98–111)
Creatinine: 2.82 mg/dL — ABNORMAL HIGH (ref 0.61–1.24)
GFR, Estimated: 23 mL/min — ABNORMAL LOW (ref >60)
Glucose, Bld: 118 mg/dL — ABNORMAL HIGH (ref 70–99)
Potassium: 4.6 mmol/L (ref 3.5–5.1)
Sodium: 138 mmol/L (ref 135–145)
Total Bilirubin: 0.4 mg/dL (ref 0.3–1.2)
Total Protein: 7.1 g/dL (ref 6.5–8.1)

## 2020-07-16 LAB — CBC WITH DIFFERENTIAL (CANCER CENTER ONLY)
Abs Immature Granulocytes: 0.03 10*3/uL (ref 0.00–0.07)
Basophils Absolute: 0 10*3/uL (ref 0.0–0.1)
Basophils Relative: 1 %
Eosinophils Absolute: 0.1 10*3/uL (ref 0.0–0.5)
Eosinophils Relative: 3 %
HCT: 34.2 % — ABNORMAL LOW (ref 39.0–52.0)
Hemoglobin: 10.9 g/dL — ABNORMAL LOW (ref 13.0–17.0)
Immature Granulocytes: 1 %
Lymphocytes Relative: 20 %
Lymphs Abs: 0.9 10*3/uL (ref 0.7–4.0)
MCH: 25.2 pg — ABNORMAL LOW (ref 26.0–34.0)
MCHC: 31.9 g/dL (ref 30.0–36.0)
MCV: 79.2 fL — ABNORMAL LOW (ref 80.0–100.0)
Monocytes Absolute: 0.7 10*3/uL (ref 0.1–1.0)
Monocytes Relative: 15 %
Neutro Abs: 2.7 10*3/uL (ref 1.7–7.7)
Neutrophils Relative %: 60 %
Platelet Count: 155 10*3/uL (ref 150–400)
RBC: 4.32 MIL/uL (ref 4.22–5.81)
RDW: 17.5 % — ABNORMAL HIGH (ref 11.5–15.5)
WBC Count: 4.4 10*3/uL (ref 4.0–10.5)
nRBC: 0 % (ref 0.0–0.2)

## 2020-07-16 LAB — IRON AND TIBC
Iron: 106 ug/dL (ref 42–163)
Saturation Ratios: 43 % (ref 20–55)
TIBC: 248 ug/dL (ref 202–409)
UIBC: 142 ug/dL (ref 117–376)

## 2020-07-16 LAB — RETICULOCYTES
Immature Retic Fract: 9.6 % (ref 2.3–15.9)
RBC.: 4.27 MIL/uL (ref 4.22–5.81)
Retic Count, Absolute: 31.6 10*3/uL (ref 19.0–186.0)
Retic Ct Pct: 0.7 % (ref 0.4–3.1)

## 2020-07-16 LAB — LACTATE DEHYDROGENASE: LDH: 188 U/L (ref 98–192)

## 2020-07-16 LAB — FERRITIN: Ferritin: 1933 ng/mL — ABNORMAL HIGH (ref 24–336)

## 2020-07-16 MED ORDER — SODIUM CHLORIDE 0.9 % IV SOLN
Freq: Once | INTRAVENOUS | Status: AC
  Filled 2020-07-16: qty 250

## 2020-07-16 MED ORDER — PALONOSETRON HCL INJECTION 0.25 MG/5ML
INTRAVENOUS | Status: AC
  Filled 2020-07-16: qty 5

## 2020-07-16 MED ORDER — SODIUM CHLORIDE 0.9% FLUSH
10.0000 mL | INTRAVENOUS | Status: DC | PRN
  Administered 2020-07-16: 10 mL
  Filled 2020-07-16: qty 10

## 2020-07-16 MED ORDER — SODIUM CHLORIDE 0.9 % IV SOLN
10.0000 mg | Freq: Once | INTRAVENOUS | Status: AC
  Administered 2020-07-16: 10 mg via INTRAVENOUS
  Filled 2020-07-16: qty 10

## 2020-07-16 MED ORDER — SODIUM CHLORIDE 0.9 % IV SOLN
300.0000 mg/m2 | Freq: Once | INTRAVENOUS | Status: AC
  Administered 2020-07-16: 620 mg via INTRAVENOUS
  Filled 2020-07-16: qty 16

## 2020-07-16 MED ORDER — SODIUM CHLORIDE 0.9 % IV SOLN
Freq: Once | INTRAVENOUS | Status: AC
Start: 2020-07-16 — End: 2020-07-16
  Filled 2020-07-16: qty 250

## 2020-07-16 MED ORDER — DEXTROSE 5 % IV SOLN
28.6000 mg/m2 | Freq: Once | INTRAVENOUS | Status: AC
  Administered 2020-07-16: 60 mg via INTRAVENOUS
  Filled 2020-07-16: qty 30

## 2020-07-16 MED ORDER — PALONOSETRON HCL INJECTION 0.25 MG/5ML
0.2500 mg | Freq: Once | INTRAVENOUS | Status: AC
  Administered 2020-07-16: 0.25 mg via INTRAVENOUS

## 2020-07-16 MED ORDER — HEPARIN SOD (PORK) LOCK FLUSH 100 UNIT/ML IV SOLN
500.0000 [IU] | Freq: Once | INTRAVENOUS | Status: AC | PRN
  Administered 2020-07-16: 500 [IU]
  Filled 2020-07-16: qty 5

## 2020-07-16 MED ORDER — TESTOSTERONE CYPIONATE 200 MG/ML IM SOLN
400.0000 mg | Freq: Once | INTRAMUSCULAR | Status: AC
Start: 2020-07-16 — End: 2020-07-16
  Administered 2020-07-16: 400 mg via INTRAMUSCULAR

## 2020-07-16 MED ORDER — TESTOSTERONE CYPIONATE 200 MG/ML IM SOLN
INTRAMUSCULAR | Status: AC
  Filled 2020-07-16: qty 2

## 2020-07-16 MED ORDER — HYDROMORPHONE HCL 1 MG/ML IJ SOLN
INTRAMUSCULAR | Status: AC
  Filled 2020-07-16: qty 2

## 2020-07-16 MED ORDER — HYDROMORPHONE HCL 1 MG/ML IJ SOLN
2.0000 mg | Freq: Once | INTRAMUSCULAR | Status: AC
  Administered 2020-07-16: 2 mg via INTRAVENOUS

## 2020-07-16 NOTE — Patient Instructions (Signed)
[facility]  Discharge Instructions: Thank you for choosing [facility] to provide your oncology and hematology care.   If you have a lab appointment with the [facility], please go directly to the [facility] and check in at the registration area.  Wear comfortable clothing and clothing appropriate for easy access to any Portacath or PICC line.   We strive to give you quality time with your provider. You may need to reschedule your appointment if you arrive late (15 or more minutes).  Arriving late affects you and other patients whose appointments are after yours.  Also, if you miss three or more appointments without notifying the office, you may be dismissed from the clinic at the provider's discretion.      For prescription refill requests, have your pharmacy contact our office and allow 72 hours for refills to be completed.    Today you received the following chemotherapy and/or immunotherapy agents kyprolis, testosterone    To help prevent nausea and vomiting after your treatment, we encourage you to take your nausea medication as directed.  BELOW ARE SYMPTOMS THAT SHOULD BE REPORTED IMMEDIATELY: . *FEVER GREATER THAN 100.4 F (38 C) OR HIGHER . *CHILLS OR SWEATING . *NAUSEA AND VOMITING THAT IS NOT CONTROLLED WITH YOUR NAUSEA MEDICATION . *UNUSUAL SHORTNESS OF BREATH . *UNUSUAL BRUISING OR BLEEDING . *URINARY PROBLEMS (pain or burning when urinating, or frequent urination) . *BOWEL PROBLEMS (unusual diarrhea, constipation, pain near the anus) . TENDERNESS IN MOUTH AND THROAT WITH OR WITHOUT PRESENCE OF ULCERS (sore throat, sores in mouth, or a toothache) . UNUSUAL RASH, SWELLING OR PAIN  . UNUSUAL VAGINAL DISCHARGE OR ITCHING   Items with * indicate a potential emergency and should be followed up as soon as possible or go to the Emergency Department if any problems should occur.  Please show the CHEMOTHERAPY ALERT CARD or IMMUNOTHERAPY  ALERT CARD at check-in to the Emergency Department and triage nurse. Should you have questions after your visit or need to cancel or reschedule your appointment, please contact [facility]  [phone] and follow the prompts.  Office hours are 8:00 a.m. to 4:30 p.m. Monday - Friday. Please note that voicemails left after 4:00 p.m. may not be returned until the following business day.  We are closed weekends and major holidays. You have access to a nurse at all times for urgent questions. Please call the main number to the clinic [phone] and follow the prompts.  For any non-urgent questions, you may also contact your provider using MyChart. We now offer e-Visits for anyone 76 and older to request care online for non-urgent symptoms. For details visit [email or web address].   Also download the MyChart app! Go to the app store, search "MyChart", open the app, select [facility], and log in with your MyChart username and password.  Due to Covid, a mask is required upon entering the hospital/clinic. If you do not have a mask, one will be given to you upon arrival. For doctor visits, patients may have 1 support person aged 59 or older with them. For treatment visits, patients cannot have anyone with them due to current Covid guidelines and our immunocompromised population.

## 2020-07-16 NOTE — Patient Instructions (Signed)

## 2020-07-16 NOTE — Progress Notes (Signed)
Hematology and Oncology Follow Up Visit  [name] [id number] [birth year] 70 y.o. 07/17/2020   Principle Diagnosis:  IgG kappa myeloma  Anemia secondary to renal insufficiency  Intermittent iron - deficiency anemia  Hypotestosteronemia  Current Therapy: Aredia 60 mg IV q 2 months-- next dose 07/2020 Aranesp 300 mcg subcu as needed for hemoglobin less than 11 DepoTestosterone400 mg q4weeks IV iron as indicated Kyprolis/Cytoxan - s/p cycle#29   Interim History:  [name] is here today for follow-up and treatment.  He is managing pretty well.  He still feels cold.  I will know this might be a form of neuropathy from his diabetes.  He still has his bony pain and myalgias.  I think he does have some fibromyalgia.  He is complaining of lower back discomfort.  This is down in his lumbosacral area.  When I palpate back there, there is some swelling.  We may need to check this out with an MRI.  I want to make sure he does not have a plasmacytoma.  The last time we checked his myeloma studies, there is no monoclonal spike in his blood.  His IgG level was 1240 mg/dL.  The kappa light chain was 5.3 mg/dL.  I noted that this is slightly trending upward.  He has had no fever.  He has had no issues with bowels or bladder.  He has had no nausea or vomiting.  He does get his testosterone injections.  This does make him feel little bit better.  He has had no bleeding.  His iron studies today showed a ferritin of 1900 with an iron saturation of 43%.  A lot of the ferritin elevation is secondary to inflammatory changes.  His appetite is doing fairly well.  He has had no nausea or vomiting.  Overall, his performance status is ECOG 1.      Allergies as of 07/16/2020      Reactions   Iodinated Diagnostic Agents Rash   Patient states he was instructed not to take IV contrast.  In 2008 he had an unknown reaction, and was told not to take it again.  He was also told not to take  it due to his kidneys.   Iodine Anxiety, Rash, Other (See Comments)   Didn't feel right "instructed not to take per MD--something with his port"      Medication List       Accurate as of Jul 16, 2020 11:59 PM. If you have any questions, ask your nurse or doctor.        amLODipine 10 MG tablet Commonly known as: NORVASC Take 10 mg by mouth daily.   atorvastatin 40 MG tablet Commonly known as: LIPITOR Take 40 mg by mouth daily at 6 PM.   Carafate 1 GM/10ML suspension Generic drug: sucralfate Take 1 g by mouth 4 (four) times daily.   carvedilol 25 MG tablet Commonly known as: COREG Take 25 mg by mouth 2 (two) times daily.   [employer] Fiber Gummies 2 g Chew Chew by mouth daily.   dicyclomine 10 MG capsule Commonly known as: BENTYL TAKE 1 CAPSULE (10 MG TOTAL) BY MOUTH 3 (THREE) TIMES DAILY BEFORE MEALS.   dronabinol 2.5 MG capsule Commonly known as: MARINOL Take 2.5 mg by mouth 2 (two) times daily before lunch and supper.   gabapentin 300 MG capsule Commonly known as: NEURONTIN TAKE 1 CAPSULE BY MOUTH THREE TIMES A DAY What changed:   how much to take  how to take this  when  to take this  additional instructions   hydrocortisone 2.5 % cream Apply topically.   lactulose 10 GM/15ML solution Commonly known as: CHRONULAC Take 30 mLs (20 g total) by mouth daily.   latanoprost 0.005 % ophthalmic solution Commonly known as: XALATAN 1 drop at bedtime.   linaclotide 145 MCG Caps capsule Commonly known as: LINZESS Take by mouth daily as needed.   losartan 50 MG tablet Commonly known as: COZAAR Take 50 mg by mouth daily.   Lumigan 0.01 % Soln Generic drug: bimatoprost Place 1 drop into both eyes 3 (three) times daily.   metoprolol succinate 25 MG 24 hr tablet Commonly known as: TOPROL-XL Take 25 mg by mouth daily.   mirabegron ER 50 MG Tb24 tablet Commonly known as: MYRBETRIQ Take 50 mg by mouth daily.   morphine 30 MG 12 hr tablet Commonly known as: MS  CONTIN Take 30 mg by mouth 2 (two) times daily.   omeprazole 40 MG capsule Commonly known as: PRILOSEC TAKE 1 CAPSULE BY MOUTH TWICE A DAY   ondansetron 8 MG tablet Commonly known as: ZOFRAN TAKE 1 TABLET(8 MG) BY MOUTH TWICE DAILY AS NEEDED FOR NAUSEA OR VOMITING   oxyCODONE 5 MG immediate release tablet Commonly known as: Roxicodone 1 tab PO q6 hours prn pain   oxyCODONE-acetaminophen 10-325 MG tablet Commonly known as: PERCOCET Take 1 tablet by mouth every 6 (six) hours as needed for pain.   pioglitazone 30 MG tablet Commonly known as: ACTOS Take 30 mg by mouth daily.   prochlorperazine 10 MG tablet Commonly known as: COMPAZINE take 1 tablet by mouth every 6 hours if needed for nausea and vomiting   Restasis 0.05 % ophthalmic emulsion Generic drug: cycloSPORINE Place 1 drop into both eyes daily.   SUMAtriptan 100 MG tablet Commonly known as: IMITREX Take 100 mg by mouth daily.   tadalafil 10 MG tablet Commonly known as: CIALIS Take by mouth.   topiramate 25 MG capsule Commonly known as: TOPAMAX Take 25 mg by mouth 2 (two) times daily.   torsemide 20 MG tablet Commonly known as: DEMADEX Take 40 mg by mouth daily.   Trulicity 9.39 QZ/0.0PQ Sopn Generic drug: Dulaglutide Inject 0.75 mg into the skin every Saturday.   Vitamin D (Ergocalciferol) 1.25 MG (50000 UNIT) Caps capsule Commonly known as: DRISDOL Take 50,000 Units by mouth every Saturday.       Allergies:  Allergies  Allergen Reactions  . Iodinated Diagnostic Agents Rash    Patient states he was instructed not to take IV contrast.  In 2008 he had an unknown reaction, and was told not to take it again.  He was also told not to take it due to his kidneys.  . Iodine Anxiety, Rash and Other (See Comments)    Didn't feel right "instructed not to take per MD--something with his port"     Past Medical History, Surgical history, Social history, and Family History were reviewed and updated.  Review  of Systems: Review of Systems  Constitutional: Positive for malaise/fatigue.  HENT: Negative.   Eyes: Negative.   Respiratory: Negative.   Cardiovascular: Negative.   Gastrointestinal: Positive for heartburn.  Genitourinary: Negative.   Musculoskeletal: Positive for joint pain and myalgias.  Skin: Negative.   Neurological: Positive for tingling.  Endo/Heme/Allergies: Negative.   Psychiatric/Behavioral: Negative.      Physical Exam:  weight is 183 lb (83 kg). His oral temperature is 98.5 F (36.9 C). His blood pressure is 118/65 and his pulse is 73.  His respiration is 18 and oxygen saturation is 98%.   Wt Readings from Last 3 Encounters:  07/16/20 183 lb (83 kg)  07/16/20 183 lb (83 kg)  06/18/20 178 lb 8 oz (81 kg)    Physical Exam Vitals reviewed.  HENT:     Head: Normocephalic and atraumatic.  Eyes:     Pupils: Pupils are equal, round, and reactive to light.  Cardiovascular:     Rate and Rhythm: Normal rate and regular rhythm.     Heart sounds: Normal heart sounds.  Pulmonary:     Effort: Pulmonary effort is normal.     Breath sounds: Normal breath sounds.  Abdominal:     General: Bowel sounds are normal.     Palpations: Abdomen is soft.  Musculoskeletal:        General: No tenderness or deformity. Normal range of motion.     Cervical back: Normal range of motion.  Lymphadenopathy:     Cervical: No cervical adenopathy.  Skin:    General: Skin is warm and dry.     Findings: No erythema or rash.  Neurological:     Mental Status: He is alert and oriented to person, place, and time.  Psychiatric:        Behavior: Behavior normal.        Thought Content: Thought content normal.        Judgment: Judgment normal.      Lab Results  Component Value Date   WBC 4.4 07/16/2020   HGB 10.9 (L) 07/16/2020   HCT 34.2 (L) 07/16/2020   MCV 79.2 (L) 07/16/2020   PLT 155 07/16/2020   Lab Results  Component Value Date   FERRITIN 1,933 (H) 07/16/2020   IRON 106  07/16/2020   TIBC 248 07/16/2020   UIBC 142 07/16/2020   IRONPCTSAT 43 07/16/2020   Lab Results  Component Value Date   RETICCTPCT 0.7 07/16/2020   RBC 4.27 07/16/2020   RETICCTABS 27.5 03/21/2014   Lab Results  Component Value Date   KPAFRELGTCHN 52.8 (H) 06/18/2020   LAMBDASER 28.0 (H) 06/18/2020   KAPLAMBRATIO 1.89 (H) 06/18/2020   Lab Results  Component Value Date   IGGSERUM 1,242 06/18/2020   IGA 257 06/18/2020   IGMSERUM 34 06/18/2020   Lab Results  Component Value Date   TOTALPROTELP 6.9 06/18/2020   ALBUMINELP 3.7 06/18/2020   A1GS 0.2 06/18/2020   A2GS 0.9 06/18/2020   BETS 0.9 06/18/2020   BETA2SER 0.5 11/07/2014   GAMS 1.2 06/18/2020   MSPIKE Not Observed 06/18/2020   SPEI Comment 05/21/2020     Chemistry      Component Value Date/Time   NA 138 07/16/2020 0818   NA 143 02/22/2017 0744   K 4.6 07/16/2020 0818   K 4.0 02/22/2017 0744   CL 97 (L) 07/16/2020 0818   CL 106 02/22/2017 0744   CO2 34 (H) 07/16/2020 0818   CO2 27 02/22/2017 0744   BUN 34 (H) 07/16/2020 0818   BUN 26 (H) 02/22/2017 0744   CREATININE 2.82 (H) 07/16/2020 0818   CREATININE 2.3 (H) 02/22/2017 0744      Component Value Date/Time   CALCIUM 9.6 07/16/2020 0818   CALCIUM 9.3 02/22/2017 0744   ALKPHOS 72 07/16/2020 0818   ALKPHOS 80 02/22/2017 0744   AST 19 07/16/2020 0818   ALT 17 07/16/2020 0818   ALT 22 02/22/2017 0744   BILITOT 0.4 07/16/2020 0818       Impression and Plan: Mr. Yurkovich is  a very pleasant 70 yo [place] American gentleman with IgG kappa myeloma. So far, the myeloma is doing quite well.  His monoclonal studies are nice and low.  So far there is been no monoclonal spike that was seen in his blood.  As such, had to believe that he is doing quite well.  Again, the Kappa light chain is trending upward.  We will have to watch this.  I really do not think he needs any Aranesp today.  His hemoglobin is 10.9.  He gets his testosterone injection.  Again this does  make him feel better.  He is due for the Aredia next month.  As always, we will get him back in another month.   I will have to set him up with an MRI of the lumbosacral spine area.  Volanda Napoleon, MD 5/25/20226:30 AM

## 2020-07-17 ENCOUNTER — Telehealth: Payer: Self-pay

## 2020-07-17 ENCOUNTER — Encounter: Payer: Self-pay | Admitting: Hematology & Oncology

## 2020-07-17 ENCOUNTER — Encounter: Payer: Self-pay | Admitting: Family

## 2020-07-17 LAB — TESTOSTERONE: Testosterone: 313 ng/dL (ref 264–916)

## 2020-07-17 LAB — IGG, IGA, IGM
IgA: 242 mg/dL (ref 61–437)
IgG (Immunoglobin G), Serum: 1118 mg/dL (ref 603–1613)
IgM (Immunoglobulin M), Srm: 35 mg/dL (ref 15–143)

## 2020-07-17 LAB — KAPPA/LAMBDA LIGHT CHAINS
Kappa free light chain: 49.5 mg/L — ABNORMAL HIGH (ref 3.3–19.4)
Kappa, lambda light chain ratio: 2.13 — ABNORMAL HIGH (ref 0.26–1.65)
Lambda free light chains: 23.2 mg/L (ref 5.7–26.3)

## 2020-07-17 NOTE — Telephone Encounter (Signed)
07/17/23 los appts needed made and printed for pt 07/16/20, mri order noted at 07/17/20 los-wl will call pt with appt   [name]

## 2020-07-18 LAB — PROTEIN ELECTROPHORESIS, SERUM, WITH REFLEX
A/G Ratio: 1.3 (ref 0.7–1.7)
Albumin ELP: 3.9 g/dL (ref 2.9–4.4)
Alpha-1-Globulin: 0.2 g/dL (ref 0.0–0.4)
Alpha-2-Globulin: 0.9 g/dL (ref 0.4–1.0)
Beta Globulin: 0.9 g/dL (ref 0.7–1.3)
Gamma Globulin: 1 g/dL (ref 0.4–1.8)
Globulin, Total: 2.9 g/dL (ref 2.2–3.9)
Total Protein ELP: 6.8 g/dL (ref 6.0–8.5)

## 2020-07-30 ENCOUNTER — Ambulatory Visit (HOSPITAL_COMMUNITY)
Admission: RE | Admit: 2020-07-30 | Discharge: 2020-07-30 | Disposition: A | Discharge status: Home / Self Care | Payer: 59 | Source: Ambulatory Visit | Attending: Hematology & Oncology | Admitting: Hematology & Oncology

## 2020-07-30 ENCOUNTER — Other Ambulatory Visit: Payer: Self-pay

## 2020-07-30 DIAGNOSIS — C9000 Multiple myeloma not having achieved remission: Secondary | ICD-10-CM | POA: Diagnosis present

## 2020-07-31 ENCOUNTER — Telehealth: Payer: Self-pay | Admitting: *Deleted

## 2020-07-31 NOTE — Telephone Encounter (Signed)
-----   Message from Volanda Napoleon, MD sent at 07/31/2020  8:01 AM EDT ----- Call - the MRI really does not show anything specific.  You have some swelling in the tailbone.  No obvious fracture.  Laurey Arrow

## 2020-07-31 NOTE — Telephone Encounter (Signed)
Unable to reach pt. Meft message on VM for pt with results.

## 2020-08-04 ENCOUNTER — Other Ambulatory Visit: Payer: Self-pay | Admitting: Hematology & Oncology

## 2020-08-04 DIAGNOSIS — R1084 Generalized abdominal pain: Secondary | ICD-10-CM

## 2020-08-04 DIAGNOSIS — R1319 Other dysphagia: Secondary | ICD-10-CM

## 2020-08-05 ENCOUNTER — Encounter: Payer: Self-pay | Admitting: Family

## 2020-08-05 ENCOUNTER — Encounter: Payer: Self-pay | Admitting: Hematology & Oncology

## 2020-08-13 ENCOUNTER — Other Ambulatory Visit: Payer: Self-pay

## 2020-08-13 ENCOUNTER — Ambulatory Visit (INDEPENDENT_AMBULATORY_CARE_PROVIDER_SITE_OTHER): Payer: 59 | Admitting: Podiatry

## 2020-08-13 ENCOUNTER — Encounter: Payer: Self-pay | Admitting: Podiatry

## 2020-08-13 DIAGNOSIS — B351 Tinea unguium: Secondary | ICD-10-CM

## 2020-08-13 DIAGNOSIS — E1151 Type 2 diabetes mellitus with diabetic peripheral angiopathy without gangrene: Secondary | ICD-10-CM | POA: Diagnosis not present

## 2020-08-13 DIAGNOSIS — M79674 Pain in right toe(s): Secondary | ICD-10-CM | POA: Diagnosis not present

## 2020-08-13 DIAGNOSIS — M79675 Pain in left toe(s): Secondary | ICD-10-CM

## 2020-08-13 NOTE — Progress Notes (Signed)
This patient returns to my office for at risk foot care.  This patient requires this care by a professional since this patient will be at risk due to having diabetes type 2.  This patient is unable to cut nails himself since the patient cannot reach his nails.These nails are painful walking and wearing shoes.  This patient presents for at risk foot care today.  General Appearance  Alert, conversant and in no acute stress.  Vascular  Dorsalis pedis and posterior tibial  pulses are weakly  palpable  bilaterally.  Capillary return is within normal limits  bilaterally. Cold feet bilaterally.  Absent digital hair.  Neurologic  Senn-Weinstein monofilament wire test within normal limits  bilaterally. Muscle power within normal limits bilaterally.  Nails Thick disfigured discolored nails with subungual debris  from hallux to fifth toes bilaterally. No evidence of bacterial infection or drainage bilaterally.  Orthopedic  No limitations of motion  feet .  No crepitus or effusions noted.  No bony pathology or digital deformities noted.  HAV  B/L.  Exostosis right foot.  Skin  normotropic skin with no porokeratosis noted bilaterally.  No signs of infections or ulcers noted.  Corn lateral aspect fourth toe right foot. No infection.  Onychomycosis  Pain in right toes  Pain in left toes  Consent was obtained for treatment procedures.   Mechanical debridement of nails 1-5  bilaterally performed with a nail nipper.  Filed with dremel without incident. Debride corn fourth toe right foot lateral border.   Return office visit    10 weeks                  Told patient to return for periodic foot care and evaluation due to potential at risk complications.   Gardiner Barefoot DPM

## 2020-08-14 ENCOUNTER — Inpatient Hospital Stay: Payer: 59

## 2020-08-14 ENCOUNTER — Inpatient Hospital Stay (HOSPITAL_BASED_OUTPATIENT_CLINIC_OR_DEPARTMENT_OTHER): Payer: 59 | Admitting: Family

## 2020-08-14 ENCOUNTER — Inpatient Hospital Stay: Payer: 59 | Attending: Hematology & Oncology

## 2020-08-14 ENCOUNTER — Telehealth: Payer: Self-pay | Admitting: *Deleted

## 2020-08-14 VITALS — BP 133/70 | HR 68 | Temp 98.3°F | Resp 18

## 2020-08-14 DIAGNOSIS — Z5111 Encounter for antineoplastic chemotherapy: Secondary | ICD-10-CM | POA: Insufficient documentation

## 2020-08-14 DIAGNOSIS — N189 Chronic kidney disease, unspecified: Secondary | ICD-10-CM | POA: Diagnosis not present

## 2020-08-14 DIAGNOSIS — D631 Anemia in chronic kidney disease: Secondary | ICD-10-CM

## 2020-08-14 DIAGNOSIS — C9000 Multiple myeloma not having achieved remission: Secondary | ICD-10-CM | POA: Diagnosis present

## 2020-08-14 DIAGNOSIS — E291 Testicular hypofunction: Secondary | ICD-10-CM | POA: Insufficient documentation

## 2020-08-14 DIAGNOSIS — Z5112 Encounter for antineoplastic immunotherapy: Secondary | ICD-10-CM | POA: Insufficient documentation

## 2020-08-14 DIAGNOSIS — Z299 Encounter for prophylactic measures, unspecified: Secondary | ICD-10-CM

## 2020-08-14 DIAGNOSIS — E349 Endocrine disorder, unspecified: Secondary | ICD-10-CM

## 2020-08-14 DIAGNOSIS — M4712 Other spondylosis with myelopathy, cervical region: Secondary | ICD-10-CM

## 2020-08-14 DIAGNOSIS — Z95828 Presence of other vascular implants and grafts: Secondary | ICD-10-CM

## 2020-08-14 DIAGNOSIS — D500 Iron deficiency anemia secondary to blood loss (chronic): Secondary | ICD-10-CM

## 2020-08-14 LAB — CMP (CANCER CENTER ONLY)
ALT: 15 U/L (ref 0–44)
AST: 20 U/L (ref 15–41)
Albumin: 4.4 g/dL (ref 3.5–5.0)
Alkaline Phosphatase: 81 U/L (ref 38–126)
Anion gap: 9 (ref 5–15)
BUN: 31 mg/dL — ABNORMAL HIGH (ref 8–23)
CO2: 30 mmol/L (ref 22–32)
Calcium: 9.8 mg/dL (ref 8.9–10.3)
Chloride: 100 mmol/L (ref 98–111)
Creatinine: 2.68 mg/dL — ABNORMAL HIGH (ref 0.61–1.24)
GFR, Estimated: 25 mL/min — ABNORMAL LOW (ref >60)
Glucose, Bld: 118 mg/dL — ABNORMAL HIGH (ref 70–99)
Potassium: 4 mmol/L (ref 3.5–5.1)
Sodium: 139 mmol/L (ref 135–145)
Total Bilirubin: 0.4 mg/dL (ref 0.3–1.2)
Total Protein: 7.3 g/dL (ref 6.5–8.1)

## 2020-08-14 LAB — CBC WITH DIFFERENTIAL (CANCER CENTER ONLY)
Abs Immature Granulocytes: 0.02 10*3/uL (ref 0.00–0.07)
Basophils Absolute: 0 10*3/uL (ref 0.0–0.1)
Basophils Relative: 1 %
Eosinophils Absolute: 0.2 10*3/uL (ref 0.0–0.5)
Eosinophils Relative: 4 %
HCT: 33.6 % — ABNORMAL LOW (ref 39.0–52.0)
Hemoglobin: 10.8 g/dL — ABNORMAL LOW (ref 13.0–17.0)
Immature Granulocytes: 0 %
Lymphocytes Relative: 19 %
Lymphs Abs: 0.8 10*3/uL (ref 0.7–4.0)
MCH: 25.8 pg — ABNORMAL LOW (ref 26.0–34.0)
MCHC: 32.1 g/dL (ref 30.0–36.0)
MCV: 80.4 fL (ref 80.0–100.0)
Monocytes Absolute: 0.6 10*3/uL (ref 0.1–1.0)
Monocytes Relative: 14 %
Neutro Abs: 2.8 10*3/uL (ref 1.7–7.7)
Neutrophils Relative %: 62 %
Platelet Count: 169 10*3/uL (ref 150–400)
RBC: 4.18 MIL/uL — ABNORMAL LOW (ref 4.22–5.81)
RDW: 16.8 % — ABNORMAL HIGH (ref 11.5–15.5)
WBC Count: 4.5 10*3/uL (ref 4.0–10.5)
nRBC: 0 % (ref 0.0–0.2)

## 2020-08-14 LAB — LACTATE DEHYDROGENASE: LDH: 206 U/L — ABNORMAL HIGH (ref 98–192)

## 2020-08-14 LAB — IRON AND TIBC
Iron: 98 ug/dL (ref 42–163)
Saturation Ratios: 39 % (ref 20–55)
TIBC: 255 ug/dL (ref 202–409)
UIBC: 157 ug/dL (ref 117–376)

## 2020-08-14 LAB — FERRITIN: Ferritin: 1057 ng/mL — ABNORMAL HIGH (ref 24–336)

## 2020-08-14 MED ORDER — HYDROMORPHONE HCL 1 MG/ML IJ SOLN
2.0000 mg | Freq: Once | INTRAMUSCULAR | Status: AC
  Administered 2020-08-14: 2 mg via INTRAVENOUS

## 2020-08-14 MED ORDER — SODIUM CHLORIDE 0.9 % IV SOLN
Freq: Once | INTRAVENOUS | Status: AC
Start: 2020-08-14 — End: 2020-08-14
  Filled 2020-08-14: qty 250

## 2020-08-14 MED ORDER — HYDROMORPHONE HCL 1 MG/ML IJ SOLN
INTRAMUSCULAR | Status: AC
  Filled 2020-08-14: qty 2

## 2020-08-14 MED ORDER — EPOETIN ALFA-EPBX 40000 UNIT/ML IJ SOLN: 40000.0000 [IU] | Freq: Once | INTRAMUSCULAR | Status: DC

## 2020-08-14 MED ORDER — PALONOSETRON HCL INJECTION 0.25 MG/5ML
0.2500 mg | Freq: Once | INTRAVENOUS | Status: AC
  Administered 2020-08-14: 0.25 mg via INTRAVENOUS

## 2020-08-14 MED ORDER — TESTOSTERONE CYPIONATE 200 MG/ML IM SOLN
400.0000 mg | Freq: Once | INTRAMUSCULAR | Status: AC
Start: 2020-08-14 — End: 2020-08-14
  Administered 2020-08-14: 400 mg via INTRAMUSCULAR

## 2020-08-14 MED ORDER — SODIUM CHLORIDE 0.9 % IV SOLN
60.0000 mg | Freq: Once | INTRAVENOUS | Status: AC
  Administered 2020-08-14: 60 mg via INTRAVENOUS
  Filled 2020-08-14: qty 10

## 2020-08-14 MED ORDER — HEPARIN SOD (PORK) LOCK FLUSH 100 UNIT/ML IV SOLN
500.0000 [IU] | Freq: Once | INTRAVENOUS | Status: AC
  Administered 2020-08-14: 500 [IU] via INTRAVENOUS
  Filled 2020-08-14: qty 5

## 2020-08-14 MED ORDER — PALONOSETRON HCL INJECTION 0.25 MG/5ML
INTRAVENOUS | Status: AC
  Filled 2020-08-14: qty 5

## 2020-08-14 MED ORDER — SODIUM CHLORIDE 0.9 % IV SOLN
300.0000 mg/m2 | Freq: Once | INTRAVENOUS | Status: AC
  Administered 2020-08-14: 620 mg via INTRAVENOUS
  Filled 2020-08-14: qty 31

## 2020-08-14 MED ORDER — DEXTROSE 5 % IV SOLN
29.0000 mg/m2 | Freq: Once | INTRAVENOUS | Status: AC
  Administered 2020-08-14: 60 mg via INTRAVENOUS
  Filled 2020-08-14: qty 30

## 2020-08-14 MED ORDER — TESTOSTERONE CYPIONATE 200 MG/ML IM SOLN
INTRAMUSCULAR | Status: AC
  Filled 2020-08-14: qty 2

## 2020-08-14 MED ORDER — SODIUM CHLORIDE 0.9% FLUSH
10.0000 mL | INTRAVENOUS | Status: DC | PRN
Start: 2020-08-14 — End: 2020-08-14
  Administered 2020-08-14: 10 mL via INTRAVENOUS
  Filled 2020-08-14: qty 10

## 2020-08-14 MED ORDER — SODIUM CHLORIDE 0.9 % IV SOLN
10.0000 mg | Freq: Once | INTRAVENOUS | Status: AC
  Administered 2020-08-14: 10 mg via INTRAVENOUS
  Filled 2020-08-14: qty 10

## 2020-08-14 MED ORDER — SODIUM CHLORIDE 0.9 % IV SOLN
Freq: Once | INTRAVENOUS | Status: DC
  Filled 2020-08-14: qty 250

## 2020-08-14 NOTE — Patient Instructions (Signed)
[facility]  Discharge Instructions: Thank you for choosing [facility] to provide your oncology and hematology care.   If you have a lab appointment with the [facility], please go directly to the [facility] and check in at the registration area.  Wear comfortable clothing and clothing appropriate for easy access to any Portacath or PICC line.   We strive to give you quality time with your provider. You may need to reschedule your appointment if you arrive late (15 or more minutes).  Arriving late affects you and other patients whose appointments are after yours.  Also, if you miss three or more appointments without notifying the office, you may be dismissed from the clinic at the provider's discretion.      For prescription refill requests, have your pharmacy contact our office and allow 72 hours for refills to be completed.    Today you received the following chemotherapy and/or immunotherapy agents Kyprolis, Cytoxan.     To help prevent nausea and vomiting after your treatment, we encourage you to take your nausea medication as directed.  BELOW ARE SYMPTOMS THAT SHOULD BE REPORTED IMMEDIATELY: *FEVER GREATER THAN 100.4 F (38 C) OR HIGHER *CHILLS OR SWEATING *NAUSEA AND VOMITING THAT IS NOT CONTROLLED WITH YOUR NAUSEA MEDICATION *UNUSUAL SHORTNESS OF BREATH *UNUSUAL BRUISING OR BLEEDING *URINARY PROBLEMS (pain or burning when urinating, or frequent urination) *BOWEL PROBLEMS (unusual diarrhea, constipation, pain near the anus) TENDERNESS IN MOUTH AND THROAT WITH OR WITHOUT PRESENCE OF ULCERS (sore throat, sores in mouth, or a toothache) UNUSUAL RASH, SWELLING OR PAIN  UNUSUAL VAGINAL DISCHARGE OR ITCHING   Items with * indicate a potential emergency and should be followed up as soon as possible or go to the Emergency Department if any problems should occur.  Please show the CHEMOTHERAPY ALERT CARD or IMMUNOTHERAPY ALERT CARD at check-in  to the Emergency Department and triage nurse. Should you have questions after your visit or need to cancel or reschedule your appointment, please contact [facility]  [phone] and follow the prompts.  Office hours are 8:00 a.m. to 4:30 p.m. Monday - Friday. Please note that voicemails left after 4:00 p.m. may not be returned until the following business day.  We are closed weekends and major holidays. You have access to a nurse at all times for urgent questions. Please call the main number to the clinic [phone] and follow the prompts.  For any non-urgent questions, you may also contact your provider using MyChart. We now offer e-Visits for anyone 26 and older to request care online for non-urgent symptoms. For details visit [email or web address].   Also download the MyChart app! Go to the app store, search "MyChart", open the app, select [facility], and log in with your MyChart username and password.  Due to Covid, a mask is required upon entering the hospital/clinic. If you do not have a mask, one will be given to you upon arrival. For doctor visits, patients may have 1 support person aged 58 or older with them. For treatment visits, patients cannot have anyone with them due to current Covid guidelines and our immunocompromised population.   Pamidronate Injection What is this medication? PAMIDRONATE (pa mi DROE nate) slows calcium loss from bones. It treats Paget's disease and high calcium levels in the blood from some kinds of cancer. It maybe used in other people at risk for bone loss. This medicine may be used for other purposes; ask your  health care provider orpharmacist if you have questions. COMMON BRAND NAME(S): Aredia What should I tell my care team before I take this medication? They need to know if you have any of these conditions: bleeding disorder cancer dental disease kidney disease low levels of calcium or other minerals in the blood low red  blood cell counts receiving steroids like dexamethasone or prednisone an unusual or allergic reaction to pamidronate, other drugs, foods, dyes or preservatives pregnant or trying to get pregnant breast-feeding How should I use this medication? This drug is injected into a vein. It is given by a health care provider in [facility] or clinic setting. Talk to your health care provider about the use of this drug in children.Special care may be needed. Overdosage: If you think you have taken too much of this medicine contact apoison control center or emergency room at once. NOTE: This medicine is only for you. Do not share this medicine with others. What if I miss a dose? Keep appointments for follow-up doses. It is important not to miss your dose.Call your health care provider if you are unable to keep an appointment. What may interact with this medication? certain antibiotics given by injection medicines for inflammation or pain like ibuprofen, naproxen some diuretics like bumetanide, furosemide cyclosporine parathyroid hormone tacrolimus teriparatide thalidomide This list may not describe all possible interactions. Give your health care provider a list of all the medicines, herbs, non-prescription drugs, or dietary supplements you use. Also tell them if you smoke, drink alcohol, or use illegaldrugs. Some items may interact with your medicine. What should I watch for while using this medication? Visit your health care provider for regular checks on your progress. It may besome time before you see the benefit from this drug. Some people who take this drug have severe bone, joint, or muscle pain. This drug may also increase your risk for jaw problems or a broken thigh bone. Tell your health care provider right away if you have severe pain in your jaw, bones, joints, or muscles. Tell you health care provider if you have any painthat does not go away or that gets worse. Tell your dentist and dental  surgeon that you are taking this drug. You should not have major dental surgery while on this drug. See your dentist to have a dental exam and fix any dental problems before starting this drug. Take good care of your teeth while on this drug. Make sure you see your dentist forregular follow-up appointments. You should make sure you get enough calcium and vitamin D while you are taking this drug. Discuss the foods you eat and the vitamins you take with your healthcare provider. You may need blood work done while you are taking this drug. Do not become pregnant while taking this drug. Women should inform their health care provider if they wish to become pregnant or think they might be pregnant. There is potential for serious harm to an unborn child. Talk to your healthcare provider for more information. What side effects may I notice from receiving this medication? Side effects that you should report to your doctor or health care provider assoon as possible: allergic reactions (skin rash, itching or hives; swelling of the face, lips, or tongue) bleeding (bloody or black, tarry stools; red or dark brown urine; spitting up blood or brown material that looks like coffee grounds; red spots on the skin; unusual bruising or bleeding from the eyes, gums, or nose) bone pain increased thirst infection (fever, chills, cough, sore  throat, pain or trouble passing urine) jaw pain, especially after dental work joint pain kidney injury (trouble passing urine or change in the amount of urine) low calcium levels (fast heartbeat; muscle cramps or pain; pain, tingling, or numbness in the hands or feet; seizures) low magnesium levels (fast, irregular heartbeat; muscle cramp or pain; muscle weakness; tremors; seizures) low potassium levels (trouble breathing; chest pain; dizziness; fast, irregular heartbeat; feeling faint or lightheaded, falls; muscle cramps or pain) muscle pain pain, redness, or irritation at site where  injected redness, blistering, peeling, or loosening of the skin, including inside the mouth severe diarrhea unusual sweating Side effects that usually do not require medical attention (report to yourdoctor or health care provider if they continue or are bothersome): constipation eye irritation, itching, or pain fever headache increase in blood pressure loss of appetite nausea stomach pain unusually weak or tired vomiting This list may not describe all possible side effects. Call your doctor for medical advice about side effects. You may report side effects to FDA at1-800-FDA-1088. Where should I keep my medication? This drug is given in a hospital or clinic. It will not be stored at home. NOTE: This sheet is a summary. It may not cover all possible information. If you have questions about this medicine, talk to your doctor, pharmacist, orhealth care provider.  2022 Elsevier/Gold Standard (2019-01-02 12:19:52)

## 2020-08-14 NOTE — Patient Instructions (Signed)
Implanted Port Home Guide An implanted port is a device that is placed under the skin. It is usually placed in the chest. The device can be used to give IV medicine, to take blood, or for dialysis. You may have an implanted port if: You need IV medicine that would be irritating to the small veins in your hands or arms. You need IV medicines, such as antibiotics, for a long period of time. You need IV nutrition for a long period of time. You need dialysis. When you have a port, your health care provider can choose to use the port instead of veins in your arms for these procedures. You may have fewer limitations when using a port than you would if you used other types of long-term IVs, and you will likely be able to return to normal activities afteryour incision heals. An implanted port has two main parts: Reservoir. The reservoir is the part where a needle is inserted to give medicines or draw blood. The reservoir is round. After it is placed, it appears as a small, raised area under your skin. Catheter. The catheter is a thin, flexible tube that connects the reservoir to a vein. Medicine that is inserted into the reservoir goes into the catheter and then into the vein. How is my port accessed? To access your port: A numbing cream may be placed on the skin over the port site. Your health care provider will put on a mask and sterile gloves. The skin over your port will be cleaned carefully with a germ-killing soap and allowed to dry. Your health care provider will gently pinch the port and insert a needle into it. Your health care provider will check for a blood return to make sure the port is in the vein and is not clogged. If your port needs to remain accessed to get medicine continuously (constant infusion), your health care provider will place a clear bandage (dressing) over the needle site. The dressing and needle will need to be changed every week, or as told by your health care provider. What  is flushing? Flushing helps keep the port from getting clogged. Follow instructions from your health care provider about how and when to flush the port. Ports are usually flushed with saline solution or a medicine called heparin. The need for flushing will depend on how the port is used: If the port is only used from time to time to give medicines or draw blood, the port may need to be flushed: Before and after medicines have been given. Before and after blood has been drawn. As part of routine maintenance. Flushing may be recommended every 4-6 weeks. If a constant infusion is running, the port may not need to be flushed. Throw away any syringes in a disposal container that is meant for sharp items (sharps container). You can buy a sharps container from a pharmacy, or you can make one by using an empty hard plastic bottle with a cover. How long will my port stay implanted? The port can stay in for as long as your health care provider thinks it is needed. When it is time for the port to come out, a surgery will be done to remove it. The surgery will be similar to the procedure that was done to putthe port in. Follow these instructions at home:  Flush your port as told by your health care provider. If you need an infusion over several days, follow instructions from your health care provider about how to take   care of your port site. Make sure you: Wash your hands with soap and water before you change your dressing. If soap and water are not available, use alcohol-based hand sanitizer. Change your dressing as told by your health care provider. Place any used dressings or infusion bags into a plastic bag. Throw that bag in the trash. Keep the dressing that covers the needle clean and dry. Do not get it wet. Do not use scissors or sharp objects near the tube. Keep the tube clamped, unless it is being used. Check your port site every day for signs of infection. Check for: Redness, swelling, or  pain. Fluid or blood. Pus or a bad smell. Protect the skin around the port site. Avoid wearing bra straps that rub or irritate the site. Protect the skin around your port from seat belts. Place a soft pad over your chest if needed. Bathe or shower as told by your health care provider. The site may get wet as long as you are not actively receiving an infusion. Return to your normal activities as told by your health care provider. Ask your health care provider what activities are safe for you. Carry a medical alert card or wear a medical alert bracelet at all times. This will let health care providers know that you have an implanted port in case of an emergency. Get help right away if: You have redness, swelling, or pain at the port site. You have fluid or blood coming from your port site. You have pus or a bad smell coming from the port site. You have a fever. Summary Implanted ports are usually placed in the chest for long-term IV access. Follow instructions from your health care provider about flushing the port and changing bandages (dressings). Take care of the area around your port by avoiding clothing that puts pressure on the area, and by watching for signs of infection. Protect the skin around your port from seat belts. Place a soft pad over your chest if needed. Get help right away if you have a fever or you have redness, swelling, pain, drainage, or a bad smell at the port site. This information is not intended to replace advice given to you by your health care provider. Make sure you discuss any questions you have with your healthcare provider. Document Revised: 06/26/2019 Document Reviewed: 06/26/2019 Elsevier Patient Education  2022 [employer]  

## 2020-08-14 NOTE — Progress Notes (Signed)
Hematology and Oncology Follow Up Visit  [name] [id number] [birth year] 70 y.o. 08/14/2020   Principle Diagnosis:  IgG kappa myeloma Anemia secondary to renal insufficiency Intermittent iron - deficiency anemia Hypotestosteronemia   Current Therapy:        Aredia 60 mg IV q 2 months -- next dose  07/2020 Aranesp 300 mcg subcu as needed for hemoglobin less than 11 DepoTestosterone 400 mg q 4 weeks IV iron as indicated Kyprolis/Cytoxan - s/p cycle 28   Interim History:  [name] is here today for follow-up and treatment. He is having a lot of back, leg and arm pain.  He feels fatigued and takes a break to rest when needed.  Anemis studies are pending.  In May, his M-spike was not detected, IgG level 1,118 mg/dL and kappa light chains 4.95 mg/dL.  No fever, n/v, cough, rash, dizziness, SOB, chest pain, palpitations, abdominal pain or changes in bowel or bladder habits.  He has some abdominal bloating. Bowel sounds present in all 4 quadrants at this time.  He had a little bright red blood in his stool this week with straining. No other blood loss noted. No dark tarry stools.  No abnormal bruising, no petechiae.  No swelling in his extremities at this time. Pedal pulses are 2+.  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  No falls or syncope to report.  He states that he good appetite and is eating well. He is doing his best to stay properly hydrated. His weight is actually a little better today at 185 lbs.   ECOG Performance Status: 1 - Symptomatic but completely ambulatory  Medications:  Allergies as of 08/14/2020       Reactions   Iodinated Diagnostic Agents Rash   Patient states he was instructed not to take IV contrast.  In 2008 he had an unknown reaction, and was told not to take it again.  He was also told not to take it due to his kidneys.   Iodine Anxiety, Rash, Other (See Comments)   Didn't feel right "instructed not to take per MD--something  with his port"        Medication List        Accurate as of August 14, 2020  8:26 AM. If you have any questions, ask your nurse or doctor.          amLODipine 10 MG tablet Commonly known as: NORVASC Take 10 mg by mouth daily.   atorvastatin 40 MG tablet Commonly known as: LIPITOR Take 40 mg by mouth daily at 6 PM.   Carafate 1 GM/10ML suspension Generic drug: sucralfate Take 1 g by mouth 4 (four) times daily.   carvedilol 25 MG tablet Commonly known as: COREG Take 25 mg by mouth 2 (two) times daily.   [employer] Fiber Gummies 2 g Chew Chew by mouth daily.   dicyclomine 10 MG capsule Commonly known as: BENTYL TAKE 1 CAPSULE (10 MG TOTAL) BY MOUTH 3 (THREE) TIMES DAILY BEFORE MEALS.   dronabinol 2.5 MG capsule Commonly known as: MARINOL Take 2.5 mg by mouth 2 (two) times daily before lunch and supper.   gabapentin 300 MG capsule Commonly known as: NEURONTIN TAKE 1 CAPSULE BY MOUTH THREE TIMES A DAY What changed:  how much to take how to take this when to take this additional instructions   hydrocortisone 2.5 % cream Apply topically.   lactulose 10 GM/15ML solution Commonly known as: CHRONULAC Take 30 mLs (20 g total) by mouth daily.  latanoprost 0.005 % ophthalmic solution Commonly known as: XALATAN 1 drop at bedtime.   linaclotide 145 MCG Caps capsule Commonly known as: LINZESS Take by mouth daily as needed.   losartan 50 MG tablet Commonly known as: COZAAR Take 50 mg by mouth daily.   Lumigan 0.01 % Soln Generic drug: bimatoprost Place 1 drop into both eyes 3 (three) times daily.   metoprolol succinate 25 MG 24 hr tablet Commonly known as: TOPROL-XL Take 25 mg by mouth daily.   mirabegron ER 50 MG Tb24 tablet Commonly known as: MYRBETRIQ Take 50 mg by mouth daily.   morphine 30 MG 12 hr tablet Commonly known as: MS CONTIN Take 30 mg by mouth 2 (two) times daily.   omeprazole 40 MG capsule Commonly known as: PRILOSEC TAKE 1 CAPSULE BY  MOUTH TWICE A DAY   ondansetron 8 MG tablet Commonly known as: ZOFRAN TAKE 1 TABLET(8 MG) BY MOUTH TWICE DAILY AS NEEDED FOR NAUSEA OR VOMITING   oxyCODONE 5 MG immediate release tablet Commonly known as: Roxicodone 1 tab PO q6 hours prn pain   oxyCODONE-acetaminophen 10-325 MG tablet Commonly known as: PERCOCET Take 1 tablet by mouth every 6 (six) hours as needed for pain.   pioglitazone 30 MG tablet Commonly known as: ACTOS Take 30 mg by mouth daily.   prochlorperazine 10 MG tablet Commonly known as: COMPAZINE take 1 tablet by mouth every 6 hours if needed for nausea and vomiting   Restasis 0.05 % ophthalmic emulsion Generic drug: cycloSPORINE Place 1 drop into both eyes daily.   SUMAtriptan 100 MG tablet Commonly known as: IMITREX Take 100 mg by mouth daily.   tadalafil 10 MG tablet Commonly known as: CIALIS Take by mouth.   topiramate 25 MG capsule Commonly known as: TOPAMAX Take 25 mg by mouth 2 (two) times daily.   torsemide 20 MG tablet Commonly known as: DEMADEX Take 40 mg by mouth daily.   Trulicity 1.28 NO/6.7EH Sopn Generic drug: Dulaglutide Inject 0.75 mg into the skin every Saturday.   Vitamin D (Ergocalciferol) 1.25 MG (50000 UNIT) Caps capsule Commonly known as: DRISDOL Take 50,000 Units by mouth every Saturday.        Allergies:  Allergies  Allergen Reactions   Iodinated Diagnostic Agents Rash    Patient states he was instructed not to take IV contrast.  In 2008 he had an unknown reaction, and was told not to take it again.  He was also told not to take it due to his kidneys.   Iodine Anxiety, Rash and Other (See Comments)    Didn't feel right "instructed not to take per MD--something with his port"     Past Medical History, Surgical history, Social history, and Family History were reviewed and updated.  Review of Systems: All other 10 point review of systems is negative.   Physical Exam:  oral temperature is 98.3 F (36.8 C).  His blood pressure is 133/70 and his pulse is 68. His respiration is 18.   Wt Readings from Last 3 Encounters:  07/16/20 183 lb (83 kg)  07/16/20 183 lb (83 kg)  06/18/20 178 lb 8 oz (81 kg)    Ocular: Sclerae unicteric, pupils equal, round and reactive to light Ear-nose-throat: Oropharynx clear, dentition fair Lymphatic: No cervical or supraclavicular adenopathy Lungs no rales or rhonchi, good excursion bilaterally Heart regular rate and rhythm, no murmur appreciated Abd soft, nontender, positive bowel sounds, no liver or spleen tip palpated, no fluid wave  MSK no focal spinal  tenderness, no joint edema Neuro: non-focal, well-oriented, appropriate affect Breasts: Deferred   Lab Results  Component Value Date   WBC 4.5 08/14/2020   HGB 10.8 (L) 08/14/2020   HCT 33.6 (L) 08/14/2020   MCV 80.4 08/14/2020   PLT 169 08/14/2020   Lab Results  Component Value Date   FERRITIN 1,933 (H) 07/16/2020   IRON 106 07/16/2020   TIBC 248 07/16/2020   UIBC 142 07/16/2020   IRONPCTSAT 43 07/16/2020   Lab Results  Component Value Date   RETICCTPCT 0.7 07/16/2020   RBC 4.18 (L) 08/14/2020   RETICCTABS 27.5 03/21/2014   Lab Results  Component Value Date   KPAFRELGTCHN 49.5 (H) 07/16/2020   LAMBDASER 23.2 07/16/2020   KAPLAMBRATIO 2.13 (H) 07/16/2020   Lab Results  Component Value Date   IGGSERUM 1,118 07/16/2020   IGA 242 07/16/2020   IGMSERUM 35 07/16/2020   Lab Results  Component Value Date   TOTALPROTELP 6.8 07/16/2020   ALBUMINELP 3.9 07/16/2020   A1GS 0.2 07/16/2020   A2GS 0.9 07/16/2020   BETS 0.9 07/16/2020   BETA2SER 0.5 11/07/2014   GAMS 1.0 07/16/2020   MSPIKE Not Observed 07/16/2020   SPEI Comment 05/21/2020     Chemistry      Component Value Date/Time   NA 138 07/16/2020 0818   NA 143 02/22/2017 0744   K 4.6 07/16/2020 0818   K 4.0 02/22/2017 0744   CL 97 (L) 07/16/2020 0818   CL 106 02/22/2017 0744   CO2 34 (H) 07/16/2020 0818   CO2 27 02/22/2017 0744    BUN 34 (H) 07/16/2020 0818   BUN 26 (H) 02/22/2017 0744   CREATININE 2.82 (H) 07/16/2020 0818   CREATININE 2.3 (H) 02/22/2017 0744      Component Value Date/Time   CALCIUM 9.6 07/16/2020 0818   CALCIUM 9.3 02/22/2017 0744   ALKPHOS 72 07/16/2020 0818   ALKPHOS 80 02/22/2017 0744   AST 19 07/16/2020 0818   ALT 17 07/16/2020 0818   ALT 22 02/22/2017 0744   BILITOT 0.4 07/16/2020 0818       Impression and Plan: Mr. Goetting is a very pleasant 70 yo African American gentleman with IgG kappa myeloma.  He struggles with generalized aches and pains with arthirtis. We will proceed with treatment today including Aredia. He also received Testosterone today as planned.  No Retacrit needed this visit, Hgb is 10.8.  Follow-up in 1 month.  He can contact our office with any questions or concerns.   Laverna Peace, NP 6/22/20228:26 AM

## 2020-08-14 NOTE — Telephone Encounter (Signed)
Per 08/14/20 los - gave patient upcoming appointments - printed calendar and gave patient in treatment room - patient confirmed

## 2020-08-15 LAB — KAPPA/LAMBDA LIGHT CHAINS
Kappa free light chain: 55.4 mg/L — ABNORMAL HIGH (ref 3.3–19.4)
Kappa, lambda light chain ratio: 2.14 — ABNORMAL HIGH (ref 0.26–1.65)
Lambda free light chains: 25.9 mg/L (ref 5.7–26.3)

## 2020-08-15 LAB — PROTEIN ELECTROPHORESIS, SERUM, WITH REFLEX
A/G Ratio: 1.4 (ref 0.7–1.7)
Albumin ELP: 4.2 g/dL (ref 2.9–4.4)
Alpha-1-Globulin: 0.2 g/dL (ref 0.0–0.4)
Alpha-2-Globulin: 0.8 g/dL (ref 0.4–1.0)
Beta Globulin: 0.9 g/dL (ref 0.7–1.3)
Gamma Globulin: 1 g/dL (ref 0.4–1.8)
Globulin, Total: 2.9 g/dL (ref 2.2–3.9)
Total Protein ELP: 7.1 g/dL (ref 6.0–8.5)

## 2020-08-15 LAB — IGG, IGA, IGM
IgA: 230 mg/dL (ref 61–437)
IgG (Immunoglobin G), Serum: 1060 mg/dL (ref 603–1613)
IgM (Immunoglobulin M), Srm: 36 mg/dL (ref 15–143)

## 2020-08-15 LAB — TESTOSTERONE: Testosterone: 260 ng/dL — ABNORMAL LOW (ref 264–916)

## 2020-09-12 ENCOUNTER — Other Ambulatory Visit: Payer: Self-pay | Admitting: Hematology & Oncology

## 2020-09-13 ENCOUNTER — Inpatient Hospital Stay: Payer: 59 | Attending: Hematology & Oncology

## 2020-09-13 ENCOUNTER — Other Ambulatory Visit: Payer: Self-pay

## 2020-09-13 ENCOUNTER — Inpatient Hospital Stay: Payer: 59

## 2020-09-13 ENCOUNTER — Ambulatory Visit: Payer: 59 | Attending: Internal Medicine

## 2020-09-13 ENCOUNTER — Inpatient Hospital Stay (HOSPITAL_BASED_OUTPATIENT_CLINIC_OR_DEPARTMENT_OTHER): Payer: 59 | Admitting: Family

## 2020-09-13 ENCOUNTER — Encounter: Payer: Self-pay | Admitting: Family

## 2020-09-13 VITALS — BP 157/75 | HR 72 | Temp 98.0°F | Resp 18

## 2020-09-13 VITALS — BP 157/76 | HR 83 | Temp 98.3°F | Resp 20 | Ht 71.0 in | Wt 187.7 lb

## 2020-09-13 DIAGNOSIS — D500 Iron deficiency anemia secondary to blood loss (chronic): Secondary | ICD-10-CM | POA: Diagnosis not present

## 2020-09-13 DIAGNOSIS — E349 Endocrine disorder, unspecified: Secondary | ICD-10-CM

## 2020-09-13 DIAGNOSIS — D631 Anemia in chronic kidney disease: Secondary | ICD-10-CM

## 2020-09-13 DIAGNOSIS — D509 Iron deficiency anemia, unspecified: Secondary | ICD-10-CM | POA: Diagnosis not present

## 2020-09-13 DIAGNOSIS — C9000 Multiple myeloma not having achieved remission: Secondary | ICD-10-CM | POA: Insufficient documentation

## 2020-09-13 DIAGNOSIS — N189 Chronic kidney disease, unspecified: Secondary | ICD-10-CM

## 2020-09-13 DIAGNOSIS — Z5112 Encounter for antineoplastic immunotherapy: Secondary | ICD-10-CM | POA: Insufficient documentation

## 2020-09-13 DIAGNOSIS — Z23 Encounter for immunization: Secondary | ICD-10-CM

## 2020-09-13 DIAGNOSIS — M4712 Other spondylosis with myelopathy, cervical region: Secondary | ICD-10-CM

## 2020-09-13 DIAGNOSIS — Z5111 Encounter for antineoplastic chemotherapy: Secondary | ICD-10-CM | POA: Insufficient documentation

## 2020-09-13 DIAGNOSIS — E291 Testicular hypofunction: Secondary | ICD-10-CM | POA: Diagnosis not present

## 2020-09-13 DIAGNOSIS — Z299 Encounter for prophylactic measures, unspecified: Secondary | ICD-10-CM

## 2020-09-13 LAB — CBC WITH DIFFERENTIAL (CANCER CENTER ONLY)
Abs Immature Granulocytes: 0.02 10*3/uL (ref 0.00–0.07)
Basophils Absolute: 0.1 10*3/uL (ref 0.0–0.1)
Basophils Relative: 1 %
Eosinophils Absolute: 0.1 10*3/uL (ref 0.0–0.5)
Eosinophils Relative: 3 %
HCT: 36.3 % — ABNORMAL LOW (ref 39.0–52.0)
Hemoglobin: 11.5 g/dL — ABNORMAL LOW (ref 13.0–17.0)
Immature Granulocytes: 0 %
Lymphocytes Relative: 15 %
Lymphs Abs: 0.7 10*3/uL (ref 0.7–4.0)
MCH: 25.9 pg — ABNORMAL LOW (ref 26.0–34.0)
MCHC: 31.7 g/dL (ref 30.0–36.0)
MCV: 81.8 fL (ref 80.0–100.0)
Monocytes Absolute: 0.5 10*3/uL (ref 0.1–1.0)
Monocytes Relative: 11 %
Neutro Abs: 3.3 10*3/uL (ref 1.7–7.7)
Neutrophils Relative %: 70 %
Platelet Count: 151 10*3/uL (ref 150–400)
RBC: 4.44 MIL/uL (ref 4.22–5.81)
RDW: 17 % — ABNORMAL HIGH (ref 11.5–15.5)
WBC Count: 4.8 10*3/uL (ref 4.0–10.5)
nRBC: 0 % (ref 0.0–0.2)

## 2020-09-13 LAB — LACTATE DEHYDROGENASE: LDH: 228 U/L — ABNORMAL HIGH (ref 98–192)

## 2020-09-13 LAB — CMP (CANCER CENTER ONLY)
ALT: 31 U/L (ref 0–44)
AST: 25 U/L (ref 15–41)
Albumin: 4.4 g/dL (ref 3.5–5.0)
Alkaline Phosphatase: 77 U/L (ref 38–126)
Anion gap: 8 (ref 5–15)
BUN: 37 mg/dL — ABNORMAL HIGH (ref 8–23)
CO2: 29 mmol/L (ref 22–32)
Calcium: 9.2 mg/dL (ref 8.9–10.3)
Chloride: 102 mmol/L (ref 98–111)
Creatinine: 2.75 mg/dL — ABNORMAL HIGH (ref 0.61–1.24)
GFR, Estimated: 24 mL/min — ABNORMAL LOW (ref >60)
Glucose, Bld: 129 mg/dL — ABNORMAL HIGH (ref 70–99)
Potassium: 4.4 mmol/L (ref 3.5–5.1)
Sodium: 139 mmol/L (ref 135–145)
Total Bilirubin: 0.5 mg/dL (ref 0.3–1.2)
Total Protein: 7.3 g/dL (ref 6.5–8.1)

## 2020-09-13 LAB — IRON AND TIBC
Iron: 83 ug/dL (ref 42–163)
Saturation Ratios: 32 % (ref 20–55)
TIBC: 256 ug/dL (ref 202–409)
UIBC: 173 ug/dL (ref 117–376)

## 2020-09-13 LAB — FERRITIN: Ferritin: 1886 ng/mL — ABNORMAL HIGH (ref 24–336)

## 2020-09-13 MED ORDER — SODIUM CHLORIDE 0.9 % IV SOLN: 300.0000 mg | Freq: Once | INTRAVENOUS | Status: DC

## 2020-09-13 MED ORDER — SODIUM CHLORIDE 0.9 % IV SOLN
60.0000 mg | Freq: Once | INTRAVENOUS | Status: AC
  Administered 2020-09-13: 60 mg via INTRAVENOUS
  Filled 2020-09-13: qty 10

## 2020-09-13 MED ORDER — DEXTROSE 5 % IV SOLN
29.0000 mg/m2 | Freq: Once | INTRAVENOUS | Status: AC
  Administered 2020-09-13: 60 mg via INTRAVENOUS
  Filled 2020-09-13: qty 30

## 2020-09-13 MED ORDER — SODIUM CHLORIDE 0.9 % IV SOLN
Freq: Once | INTRAVENOUS | Status: AC
  Filled 2020-09-13: qty 250

## 2020-09-13 MED ORDER — HYDROMORPHONE HCL 1 MG/ML IJ SOLN
INTRAMUSCULAR | Status: AC
  Filled 2020-09-13: qty 2

## 2020-09-13 MED ORDER — HYDROMORPHONE HCL 1 MG/ML IJ SOLN
2.0000 mg | Freq: Once | INTRAMUSCULAR | Status: AC
  Administered 2020-09-13: 2 mg via INTRAVENOUS

## 2020-09-13 MED ORDER — HEPARIN SOD (PORK) LOCK FLUSH 100 UNIT/ML IV SOLN
500.0000 [IU] | Freq: Once | INTRAVENOUS | Status: AC | PRN
  Administered 2020-09-13: 500 [IU]
  Filled 2020-09-13: qty 5

## 2020-09-13 MED ORDER — SODIUM CHLORIDE 0.9% FLUSH
10.0000 mL | INTRAVENOUS | Status: DC | PRN
  Administered 2020-09-13: 10 mL
  Filled 2020-09-13: qty 10

## 2020-09-13 MED ORDER — PALONOSETRON HCL INJECTION 0.25 MG/5ML
INTRAVENOUS | Status: AC
  Filled 2020-09-13: qty 5

## 2020-09-13 MED ORDER — SODIUM CHLORIDE 0.9 % IV SOLN
Freq: Once | INTRAVENOUS | Status: DC
  Filled 2020-09-13: qty 250

## 2020-09-13 MED ORDER — TESTOSTERONE CYPIONATE 200 MG/ML IM SOLN
INTRAMUSCULAR | Status: AC
  Filled 2020-09-13: qty 2

## 2020-09-13 MED ORDER — PALONOSETRON HCL INJECTION 0.25 MG/5ML
0.2500 mg | Freq: Once | INTRAVENOUS | Status: AC
  Administered 2020-09-13: 0.25 mg via INTRAVENOUS

## 2020-09-13 MED ORDER — SODIUM CHLORIDE 0.9 % IV SOLN
10.0000 mg | Freq: Once | INTRAVENOUS | Status: AC
  Administered 2020-09-13: 10 mg via INTRAVENOUS
  Filled 2020-09-13: qty 10

## 2020-09-13 MED ORDER — TESTOSTERONE CYPIONATE 200 MG/ML IM SOLN
400.0000 mg | Freq: Once | INTRAMUSCULAR | Status: AC
  Administered 2020-09-13: 400 mg via INTRAMUSCULAR

## 2020-09-13 MED ORDER — CYCLOPHOSPHAMIDE CHEMO INJECTION 1 GM
300.0000 mg/m2 | Freq: Once | INTRAMUSCULAR | Status: AC
  Administered 2020-09-13: 620 mg via INTRAVENOUS
  Filled 2020-09-13: qty 31

## 2020-09-13 NOTE — Patient Instructions (Signed)
[place] [facility] [place]  Discharge Instructions: Thank you for choosing [facility] to provide your oncology and hematology care.   If you have a lab appointment with the [facility], please go directly to the [facility] and check in at the registration area.  Wear comfortable clothing and clothing appropriate for easy access to any Portacath or PICC line.   We strive to give you quality time with your provider. You may need to reschedule your appointment if you arrive late (15 or more minutes).  Arriving late affects you and other patients whose appointments are after yours.  Also, if you miss three or more appointments without notifying the office, you may be dismissed from the clinic at the provider's discretion.      For prescription refill requests, have your pharmacy contact our office and allow 72 hours for refills to be completed.    Today you received the following chemotherapy and/or immunotherapy agents cytoxan, kyprolis, aredia      To help prevent nausea and vomiting after your treatment, we encourage you to take your nausea medication as directed.  BELOW ARE SYMPTOMS THAT SHOULD BE REPORTED IMMEDIATELY: *FEVER GREATER THAN 100.4 F (38 C) OR HIGHER *CHILLS OR SWEATING *NAUSEA AND VOMITING THAT IS NOT CONTROLLED WITH YOUR NAUSEA MEDICATION *UNUSUAL SHORTNESS OF BREATH *UNUSUAL BRUISING OR BLEEDING *URINARY PROBLEMS (pain or burning when urinating, or frequent urination) *BOWEL PROBLEMS (unusual diarrhea, constipation, pain near the anus) TENDERNESS IN MOUTH AND THROAT WITH OR WITHOUT PRESENCE OF ULCERS (sore throat, sores in mouth, or a toothache) UNUSUAL RASH, SWELLING OR PAIN  UNUSUAL VAGINAL DISCHARGE OR ITCHING   Items with * indicate a potential emergency and should be followed up as soon as possible or go to the Emergency Department if any problems should occur.  Please show the CHEMOTHERAPY ALERT CARD or IMMUNOTHERAPY ALERT CARD at  check-in to the Emergency Department and triage nurse. Should you have questions after your visit or need to cancel or reschedule your appointment, please contact [facility]  [phone] and follow the prompts.  Office hours are 8:00 a.m. to 4:30 p.m. Monday - Friday. Please note that voicemails left after 4:00 p.m. may not be returned until the following business day.  We are closed weekends and major holidays. You have access to a nurse at all times for urgent questions. Please call the main number to the clinic [phone] and follow the prompts.  For any non-urgent questions, you may also contact your provider using MyChart. We now offer e-Visits for anyone 77 and older to request care online for non-urgent symptoms. For details visit [email or web address].   Also download the MyChart app! Go to the app store, search "MyChart", open the app, select [facility], and log in with your MyChart username and password.  Due to Covid, a mask is required upon entering the hospital/clinic. If you do not have a mask, one will be given to you upon arrival. For doctor visits, patients may have 1 support person aged 70 or older with them. For treatment visits, patients cannot have anyone with them due to current Covid guidelines and our immunocompromised population. Pamidronate Injection What is this medication? PAMIDRONATE (pa mi DROE nate) slows calcium loss from bones. It treats Paget's disease and high calcium levels in the blood from some kinds of cancer. It maybe used in other people at risk for bone loss. This medicine may be used for other purposes; ask your  health care provider orpharmacist if you have questions. COMMON BRAND NAME(S): Aredia What should I tell my care team before I take this medication? They need to know if you have any of these conditions: bleeding disorder cancer dental disease kidney disease low levels of calcium or other minerals in the blood low  red blood cell counts receiving steroids like dexamethasone or prednisone an unusual or allergic reaction to pamidronate, other drugs, foods, dyes or preservatives pregnant or trying to get pregnant breast-feeding How should I use this medication? This drug is injected into a vein. It is given by a health care provider in [place] or clinic setting. Talk to your health care provider about the use of this drug in children.Special care may be needed. Overdosage: If you think you have taken too much of this medicine contact apoison control center or emergency room at once. NOTE: This medicine is only for you. Do not share this medicine with others. What if I miss a dose? Keep appointments for follow-up doses. It is important not to miss your dose.Call your health care provider if you are unable to keep an appointment. What may interact with this medication? certain antibiotics given by injection medicines for inflammation or pain like ibuprofen, naproxen some diuretics like bumetanide, furosemide cyclosporine parathyroid hormone tacrolimus teriparatide thalidomide This list may not describe all possible interactions. Give your health care provider a list of all the medicines, herbs, non-prescription drugs, or dietary supplements you use. Also tell them if you smoke, drink alcohol, or use illegaldrugs. Some items may interact with your medicine. What should I watch for while using this medication? Visit your health care provider for regular checks on your progress. It may besome time before you see the benefit from this drug. Some people who take this drug have severe bone, joint, or muscle pain. This drug may also increase your risk for jaw problems or a broken thigh bone. Tell your health care provider right away if you have severe pain in your jaw, bones, joints, or muscles. Tell you health care provider if you have any painthat does not go away or that gets worse. Tell your dentist and  dental surgeon that you are taking this drug. You should not have major dental surgery while on this drug. See your dentist to have a dental exam and fix any dental problems before starting this drug. Take good care of your teeth while on this drug. Make sure you see your dentist forregular follow-up appointments. You should make sure you get enough calcium and vitamin D while you are taking this drug. Discuss the foods you eat and the vitamins you take with your healthcare provider. You may need blood work done while you are taking this drug. Do not become pregnant while taking this drug. Women should inform their health care provider if they wish to become pregnant or think they might be pregnant. There is potential for serious harm to an unborn child. Talk to your healthcare provider for more information. What side effects may I notice from receiving this medication? Side effects that you should report to your doctor or health care provider assoon as possible: allergic reactions (skin rash, itching or hives; swelling of the face, lips, or tongue) bleeding (bloody or black, tarry stools; red or dark brown urine; spitting up blood or brown material that looks like coffee grounds; red spots on the skin; unusual bruising or bleeding from the eyes, gums, or nose) bone pain increased thirst infection (fever, chills, cough, sore  throat, pain or trouble passing urine) jaw pain, especially after dental work joint pain kidney injury (trouble passing urine or change in the amount of urine) low calcium levels (fast heartbeat; muscle cramps or pain; pain, tingling, or numbness in the hands or feet; seizures) low magnesium levels (fast, irregular heartbeat; muscle cramp or pain; muscle weakness; tremors; seizures) low potassium levels (trouble breathing; chest pain; dizziness; fast, irregular heartbeat; feeling faint or lightheaded, falls; muscle cramps or pain) muscle pain pain, redness, or irritation at  site where injected redness, blistering, peeling, or loosening of the skin, including inside the mouth severe diarrhea unusual sweating Side effects that usually do not require medical attention (report to yourdoctor or health care provider if they continue or are bothersome): constipation eye irritation, itching, or pain fever headache increase in blood pressure loss of appetite nausea stomach pain unusually weak or tired vomiting This list may not describe all possible side effects. Call your doctor for medical advice about side effects. You may report side effects to FDA at1-800-FDA-1088. Where should I keep my medication? This drug is given in a hospital or clinic. It will not be stored at home. NOTE: This sheet is a summary. It may not cover all possible information. If you have questions about this medicine, talk to your doctor, pharmacist, orhealth care provider.  2022 Elsevier/Gold Standard (2019-01-02 12:19:52) Cyclophosphamide Injection What is this medication? CYCLOPHOSPHAMIDE (sye kloe FOSS fa mide) is a chemotherapy drug. It slows the growth of cancer cells. This medicine is used to treat many types of cancer like lymphoma, myeloma, leukemia, breast cancer, and ovarian cancer, to name afew. This medicine may be used for other purposes; ask your health care provider orpharmacist if you have questions. COMMON BRAND NAME(S): Cytoxan, Neosar What should I tell my care team before I take this medication? They need to know if you have any of these conditions: heart disease history of irregular heartbeat infection kidney disease liver disease low blood counts, like white cells, platelets, or red blood cells on hemodialysis recent or ongoing radiation therapy scarring or thickening of the lungs trouble passing urine an unusual or allergic reaction to cyclophosphamide, other medicines, foods, dyes, or preservatives pregnant or trying to get pregnant breast-feeding How  should I use this medication? This drug is usually given as an injection into a vein or muscle or by infusion into a vein. It is administered in a hospital or clinic by a specially trainedhealth care professional. Talk to your pediatrician regarding the use of this medicine in children.Special care may be needed. Overdosage: If you think you have taken too much of this medicine contact apoison control center or emergency room at once. NOTE: This medicine is only for you. Do not share this medicine with others. What if I miss a dose? It is important not to miss your dose. Call your doctor or health careprofessional if you are unable to keep an appointment. What may interact with this medication? amphotericin B azathioprine certain antivirals for HIV or hepatitis certain medicines for blood pressure, heart disease, irregular heart beat certain medicines that treat or prevent blood clots like warfarin certain other medicines for cancer cyclosporine etanercept indomethacin medicines that relax muscles for surgery medicines to increase blood counts metronidazole This list may not describe all possible interactions. Give your health care provider a list of all the medicines, herbs, non-prescription drugs, or dietary supplements you use. Also tell them if you smoke, drink alcohol, or use illegaldrugs. Some items may interact with your  medicine. What should I watch for while using this medication? Your condition will be monitored carefully while you are receiving thismedicine. You may need blood work done while you are taking this medicine. Drink water or other fluids as directed. Urinate often, even at night. Some products may contain alcohol. Ask your health care professional if this medicine contains alcohol. Be sure to tell all health care professionals you are taking this medicine. Certain medicines, like metronidazole and disulfiram, can cause an unpleasant reaction when taken with alcohol.  The reaction includes flushing, headache, nausea, vomiting, sweating, and increased thirst. Thereaction can last from 30 minutes to several hours. Do not become pregnant while taking this medicine or for 1 year after stopping it. Women should inform their health care professional if they wish to become pregnant or think they might be pregnant. Men should not father a child while taking this medicine and for 4 months after stopping it. There is potential for serious side effects to an unborn child. Talk to your health care professionalfor more information. Do not breast-feed an infant while taking this medicine or for 1 week afterstopping it. This medicine has caused ovarian failure in some women. This medicine may make it more difficult to get pregnant. Talk to your health care professional if [place] concerned about your fertility. This medicine has caused decreased sperm counts in some men. This may make it more difficult to father a child. Talk to your health care professional if [place] concerned about your fertility. Call your health care professional for advice if you get a fever, chills, or sore throat, or other symptoms of a cold or flu. Do not treat yourself. This medicine decreases your body's ability to fight infections. Try to avoid beingaround people who are sick. Avoid taking medicines that contain aspirin, acetaminophen, ibuprofen, naproxen, or ketoprofen unless instructed by your health care professional.These medicines may hide a fever. Talk to your health care professional about your risk of cancer. You may bemore at risk for certain types of cancer if you take this medicine. If you are going to need surgery or other procedure, tell your health careprofessional that you are using this medicine. Be careful brushing or flossing your teeth or using a toothpick because you may get an infection or bleed more easily. If you have any dental work done, Primary school teacher you are receiving this  medicine. What side effects may I notice from receiving this medication? Side effects that you should report to your doctor or health care professionalas soon as possible: allergic reactions like skin rash, itching or hives, swelling of the face, lips, or tongue breathing problems nausea, vomiting signs and symptoms of bleeding such as bloody or black, tarry stools; red or dark brown urine; spitting up blood or brown material that looks like coffee grounds; red spots on the skin; unusual bruising or bleeding from the eyes, gums, or nose signs and symptoms of heart failure like fast, irregular heartbeat, sudden weight gain; swelling of the ankles, feet, hands signs and symptoms of infection like fever; chills; cough; sore throat; pain or trouble passing urine signs and symptoms of kidney injury like trouble passing urine or change in the amount of urine signs and symptoms of liver injury like dark yellow or brown urine; general ill feeling or flu-like symptoms; light-colored stools; loss of appetite; nausea; right upper belly pain; unusually weak or tired; yellowing of the eyes or skin Side effects that usually do not require medical attention (report to yourdoctor or health care  professional if they continue or are bothersome): confusion decreased hearing diarrhea facial flushing hair loss headache loss of appetite missed menstrual periods signs and symptoms of low red blood cells or anemia such as unusually weak or tired; feeling faint or lightheaded; falls skin discoloration This list may not describe all possible side effects. Call your doctor for medical advice about side effects. You may report side effects to FDA at1-800-FDA-1088. Where should I keep my medication? This drug is given in a hospital or clinic and will not be stored at home. NOTE: This sheet is a summary. It may not cover all possible information. If you have questions about this medicine, talk to your doctor, pharmacist,  orhealth care provider.  2022 Elsevier/Gold Standard (2018-11-14 09:53:29) Carfilzomib injection What is this medication? CARFILZOMIB (kar FILZ oh mib) targets a specific protein within cancer cellsand stops the cancer cells from growing. It is used to treat multiple myeloma. This medicine may be used for other purposes; ask your health care provider orpharmacist if you have questions. COMMON BRAND NAME(S): KYPROLIS What should I tell my care team before I take this medication? They need to know if you have any of these conditions: heart disease history of blood clots irregular heartbeat kidney disease liver disease lung or breathing disease an unusual or allergic reaction to carfilzomib, or other medicines, foods, dyes, or preservatives pregnant or trying to get pregnant breast-feeding How should I use this medication? This medicine is for injection or infusion into a vein. It is given by a healthcare professional in a hospital or clinic setting. Talk to your pediatrician regarding the use of this medicine in children.Special care may be needed. Overdosage: If you think you have taken too much of this medicine contact apoison control center or emergency room at once. NOTE: This medicine is only for you. Do not share this medicine with others. What if I miss a dose? It is important not to miss your dose. Call your doctor or health careprofessional if you are unable to keep an appointment. What may interact with this medication? Interactions are not expected. This list may not describe all possible interactions. Give your health care provider a list of all the medicines, herbs, non-prescription drugs, or dietary supplements you use. Also tell them if you smoke, drink alcohol, or use illegaldrugs. Some items may interact with your medicine. What should I watch for while using this medication? Your condition will be monitored while you are receiving this medicine. You may need blood work  done while you are taking this medicine. Do not become pregnant while taking this medicine or for 6 months after stopping it. Women should inform their health care provider if they wish to become pregnant or think they might be pregnant. Men should not father a child while taking this medicine and for 3 months after stopping it. There is a potential for serious side effects to an unborn child. Talk to your health care provider for more information. Do not breast-feed an infant while taking thismedicine or for 2 weeks after stopping it. Check with your health care provider if you have severe diarrhea, nausea, and vomiting, or if you sweat a lot. The loss of too much body fluid may make itdangerous for you to take this medicine. You may get drowsy or dizzy. Do not drive, use machinery, or do anything that needs mental alertness until you know how this medicine affects you. Do not stand up or sit up quickly, especially if you are an older  patient. Thisreduces the risk of dizzy or fainting spells. What side effects may I notice from receiving this medication? Side effects that you should report to your doctor or health care professionalas soon as possible: allergic reactions like skin rash, itching or hives, swelling of the face, lips, or tongue confusion dizziness feeling faint or lightheaded fever or chills palpitations seizures signs and symptoms of bleeding such as bloody or black, tarry stools; red or dark-brown urine; spitting up blood or brown material that looks like coffee grounds; red spots on the skin; unusual bruising or bleeding including from the eye, gums, or nose signs and symptoms of a blood clot such as breathing problems; changes in vision; chest pain; severe, sudden headache; pain, swelling, warmth in the leg; trouble speaking; sudden numbness or weakness of the face, arm or leg signs and symptoms of kidney injury like trouble passing urine or change in the amount of urine signs and  symptoms of liver injury like dark yellow or brown urine; general ill feeling or flu-like symptoms; light-colored stools; loss of appetite; nausea; right upper belly pain; unusually weak or tired; yellowing of the eyes or skin Side effects that usually do not require medical attention (report to yourdoctor or health care professional if they continue or are bothersome): back pain cough diarrhea headache muscle cramps trouble sleeping vomiting This list may not describe all possible side effects. Call your doctor for medical advice about side effects. You may report side effects to FDA at1-800-FDA-1088. Where should I keep my medication? This drug is given in a hospital or clinic and will not be stored at home. NOTE: This sheet is a summary. It may not cover all possible information. If you have questions about this medicine, talk to your doctor, pharmacist, orhealth care provider.  2022 Elsevier/Gold Standard (2020-03-01 15:24:55)

## 2020-09-13 NOTE — Patient Instructions (Signed)
Implanted Port Home Guide An implanted port is a device that is placed under the skin. It is usually placed in the chest. The device can be used to give IV medicine, to take blood, or for dialysis. You may have an implanted port if: You need IV medicine that would be irritating to the small veins in your hands or arms. You need IV medicines, such as antibiotics, for a long period of time. You need IV nutrition for a long period of time. You need dialysis. When you have a port, your health care provider can choose to use the port instead of veins in your arms for these procedures. You may have fewer limitations when using a port than you would if you used other types of long-term IVs, and you will likely be able to return to normal activities afteryour incision heals. An implanted port has two main parts: Reservoir. The reservoir is the part where a needle is inserted to give medicines or draw blood. The reservoir is round. After it is placed, it appears as a small, raised area under your skin. Catheter. The catheter is a thin, flexible tube that connects the reservoir to a vein. Medicine that is inserted into the reservoir goes into the catheter and then into the vein. How is my port accessed? To access your port: A numbing cream may be placed on the skin over the port site. Your health care provider will put on a mask and sterile gloves. The skin over your port will be cleaned carefully with a germ-killing soap and allowed to dry. Your health care provider will gently pinch the port and insert a needle into it. Your health care provider will check for a blood return to make sure the port is in the vein and is not clogged. If your port needs to remain accessed to get medicine continuously (constant infusion), your health care provider will place a clear bandage (dressing) over the needle site. The dressing and needle will need to be changed every week, or as told by your health care provider. What  is flushing? Flushing helps keep the port from getting clogged. Follow instructions from your health care provider about how and when to flush the port. Ports are usually flushed with saline solution or a medicine called heparin. The need for flushing will depend on how the port is used: If the port is only used from time to time to give medicines or draw blood, the port may need to be flushed: Before and after medicines have been given. Before and after blood has been drawn. As part of routine maintenance. Flushing may be recommended every 4-6 weeks. If a constant infusion is running, the port may not need to be flushed. Throw away any syringes in a disposal container that is meant for sharp items (sharps container). You can buy a sharps container from a pharmacy, or you can make one by using an empty hard plastic bottle with a cover. How long will my port stay implanted? The port can stay in for as long as your health care provider thinks it is needed. When it is time for the port to come out, a surgery will be done to remove it. The surgery will be similar to the procedure that was done to putthe port in. Follow these instructions at home:  Flush your port as told by your health care provider. If you need an infusion over several days, follow instructions from your health care provider about how to take   care of your port site. Make sure you: Wash your hands with soap and water before you change your dressing. If soap and water are not available, use alcohol-based hand sanitizer. Change your dressing as told by your health care provider. Place any used dressings or infusion bags into a plastic bag. Throw that bag in the trash. Keep the dressing that covers the needle clean and dry. Do not get it wet. Do not use scissors or sharp objects near the tube. Keep the tube clamped, unless it is being used. Check your port site every day for signs of infection. Check for: Redness, swelling, or  pain. Fluid or blood. Pus or a bad smell. Protect the skin around the port site. Avoid wearing bra straps that rub or irritate the site. Protect the skin around your port from seat belts. Place a soft pad over your chest if needed. Bathe or shower as told by your health care provider. The site may get wet as long as you are not actively receiving an infusion. Return to your normal activities as told by your health care provider. Ask your health care provider what activities are safe for you. Carry a medical alert card or wear a medical alert bracelet at all times. This will let health care providers know that you have an implanted port in case of an emergency. Get help right away if: You have redness, swelling, or pain at the port site. You have fluid or blood coming from your port site. You have pus or a bad smell coming from the port site. You have a fever. Summary Implanted ports are usually placed in the chest for long-term IV access. Follow instructions from your health care provider about flushing the port and changing bandages (dressings). Take care of the area around your port by avoiding clothing that puts pressure on the area, and by watching for signs of infection. Protect the skin around your port from seat belts. Place a soft pad over your chest if needed. Get help right away if you have a fever or you have redness, swelling, pain, drainage, or a bad smell at the port site. This information is not intended to replace advice given to you by your health care provider. Make sure you discuss any questions you have with your healthcare provider. Document Revised: 06/26/2019 Document Reviewed: 06/26/2019 Elsevier Patient Education  2022 [employer]  

## 2020-09-13 NOTE — Progress Notes (Signed)
Hematology and Oncology Follow Up Visit  [name] [id number] 15-Nov-1950 70 y.o. 09/13/2020   Principle Diagnosis:  IgG kappa myeloma Anemia secondary to renal insufficiency Intermittent iron - deficiency anemia Hypotestosteronemia   Current Therapy:        Aredia 60 mg IV q 2 months -- next dose  07/2020 Retacrit SQ as needed for hemoglobin less than 10 DepoTestosterone 400 mg q 4 weeks IV iron as indicated Kyprolis/Cytoxan - s/p cycle 29   Interim History:  [name] is here today for follow-up and treatment. He is doing fairly well but is symptomatic with fatigue and generalized body aches and pains with arthritis.  Last month M-spike was not observed, IgG level 1,060 mg/dL and Kappa light chains 5.54 mg/dL.  He started treatment with Keflex yesterday for a UTI and has noted that his symptoms are a little improved this morning. He still has some pelvic pressure and frequency.  No fever, chills, n/v, cough, rash, dizziness, chest pain, palpitations or changes in bowel habits.  He has occasional SOB with over exertion and takes a break to rest when needed.  He states that Imitrex is effective in treating his headaches.  Mild puffiness in his feet and ankles comes and goes. Pedal pulses are 2+.  Neuropathy in his hands and feet is unchanged from baseline.  No falls or syncope to report.  No bruising, bleeding or petechiae.  He states that his appetite comes and goes but he is drinking 3-4 Ensure daily. He feels that he is hydrating adequately. His weight is stable at 187 lbs.   ECOG Performance Status: 1 - Symptomatic but completely ambulatory  Medications:  Allergies as of 09/13/2020       Reactions   Iodinated Diagnostic Agents Rash   Patient states he was instructed not to take IV contrast.  In 2008 he had an unknown reaction, and was told not to take it again.  He was also told not to take it due to his kidneys.   Iodine Anxiety, Rash, Other (See Comments)   Didn't  feel right "instructed not to take per MD--something with his port"        Medication List        Accurate as of September 13, 2020  8:17 AM. If you have any questions, ask your nurse or doctor.          amLODipine 10 MG tablet Commonly known as: NORVASC Take 10 mg by mouth daily.   atorvastatin 40 MG tablet Commonly known as: LIPITOR Take 40 mg by mouth daily at 6 PM.   Carafate 1 GM/10ML suspension Generic drug: sucralfate Take 1 g by mouth 4 (four) times daily.   carvedilol 25 MG tablet Commonly known as: COREG Take 25 mg by mouth 2 (two) times daily.   [employer] Fiber Gummies 2 g Chew Chew by mouth daily.   dicyclomine 10 MG capsule Commonly known as: BENTYL TAKE 1 CAPSULE (10 MG TOTAL) BY MOUTH 3 (THREE) TIMES DAILY BEFORE MEALS.   dronabinol 2.5 MG capsule Commonly known as: MARINOL Take 2.5 mg by mouth 2 (two) times daily before lunch and supper.   gabapentin 300 MG capsule Commonly known as: NEURONTIN TAKE 1 CAPSULE BY MOUTH THREE TIMES A DAY What changed:  how much to take how to take this when to take this additional instructions   hydrocortisone 2.5 % cream Apply topically.   lactulose 10 GM/15ML solution Commonly known as: CHRONULAC Take 30 mLs (20 g total) by  mouth daily.   latanoprost 0.005 % ophthalmic solution Commonly known as: XALATAN 1 drop at bedtime.   linaclotide 145 MCG Caps capsule Commonly known as: LINZESS Take by mouth daily as needed.   losartan 50 MG tablet Commonly known as: COZAAR Take 50 mg by mouth daily.   Lumigan 0.01 % Soln Generic drug: bimatoprost Place 1 drop into both eyes 3 (three) times daily.   metoprolol succinate 25 MG 24 hr tablet Commonly known as: TOPROL-XL Take 25 mg by mouth daily.   mirabegron ER 50 MG Tb24 tablet Commonly known as: MYRBETRIQ Take 50 mg by mouth daily.   morphine 30 MG 12 hr tablet Commonly known as: MS CONTIN Take 30 mg by mouth 2 (two) times daily.   omeprazole 40 MG  capsule Commonly known as: PRILOSEC TAKE 1 CAPSULE BY MOUTH TWICE A DAY   ondansetron 8 MG tablet Commonly known as: ZOFRAN TAKE 1 TABLET(8 MG) BY MOUTH TWICE DAILY AS NEEDED FOR NAUSEA OR VOMITING   oxyCODONE 5 MG immediate release tablet Commonly known as: Roxicodone 1 tab PO q6 hours prn pain   oxyCODONE-acetaminophen 10-325 MG tablet Commonly known as: PERCOCET Take 1 tablet by mouth every 6 (six) hours as needed for pain.   pioglitazone 30 MG tablet Commonly known as: ACTOS Take 30 mg by mouth daily.   prochlorperazine 10 MG tablet Commonly known as: COMPAZINE take 1 tablet by mouth every 6 hours if needed for nausea and vomiting   Restasis 0.05 % ophthalmic emulsion Generic drug: cycloSPORINE Place 1 drop into both eyes daily.   SUMAtriptan 100 MG tablet Commonly known as: IMITREX Take 100 mg by mouth daily.   tadalafil 10 MG tablet Commonly known as: CIALIS Take by mouth.   topiramate 25 MG capsule Commonly known as: TOPAMAX Take 25 mg by mouth 2 (two) times daily.   torsemide 20 MG tablet Commonly known as: DEMADEX Take 40 mg by mouth daily.   Trulicity 8.67 YP/9.5KD Sopn Generic drug: Dulaglutide Inject 0.75 mg into the skin every Saturday.   Vitamin D (Ergocalciferol) 1.25 MG (50000 UNIT) Caps capsule Commonly known as: DRISDOL Take 50,000 Units by mouth every Saturday.        Allergies:  Allergies  Allergen Reactions   Iodinated Diagnostic Agents Rash    Patient states he was instructed not to take IV contrast.  In 2008 he had an unknown reaction, and was told not to take it again.  He was also told not to take it due to his kidneys.   Iodine Anxiety, Rash and Other (See Comments)    Didn't feel right "instructed not to take per MD--something with his port"     Past Medical History, Surgical history, Social history, and Family History were reviewed and updated.  Review of Systems: All other 10 point review of systems is negative.    Physical Exam:  vitals were not taken for this visit.   Wt Readings from Last 3 Encounters:  07/16/20 183 lb (83 kg)  07/16/20 183 lb (83 kg)  06/18/20 178 lb 8 oz (81 kg)    Ocular: Sclerae unicteric, pupils equal, round and reactive to light Ear-nose-throat: Oropharynx clear, dentition fair Lymphatic: No cervical or supraclavicular adenopathy Lungs no rales or rhonchi, good excursion bilaterally Heart regular rate and rhythm, no murmur appreciated Abd soft, nontender, positive bowel sounds MSK no focal spinal tenderness, no joint edema Neuro: non-focal, well-oriented, appropriate affect Breasts: Deferred   Lab Results  Component Value Date  WBC 4.5 08/14/2020   HGB 10.8 (L) 08/14/2020   HCT 33.6 (L) 08/14/2020   MCV 80.4 08/14/2020   PLT 169 08/14/2020   Lab Results  Component Value Date   FERRITIN 1,057 (H) 08/14/2020   IRON 98 08/14/2020   TIBC 255 08/14/2020   UIBC 157 08/14/2020   IRONPCTSAT 39 08/14/2020   Lab Results  Component Value Date   RETICCTPCT 0.7 07/16/2020   RBC 4.18 (L) 08/14/2020   RETICCTABS 27.5 03/21/2014   Lab Results  Component Value Date   KPAFRELGTCHN 55.4 (H) 08/14/2020   LAMBDASER 25.9 08/14/2020   KAPLAMBRATIO 2.14 (H) 08/14/2020   Lab Results  Component Value Date   IGGSERUM 1,060 08/14/2020   IGA 230 08/14/2020   IGMSERUM 36 08/14/2020   Lab Results  Component Value Date   TOTALPROTELP 7.1 08/14/2020   ALBUMINELP 4.2 08/14/2020   A1GS 0.2 08/14/2020   A2GS 0.8 08/14/2020   BETS 0.9 08/14/2020   BETA2SER 0.5 11/07/2014   GAMS 1.0 08/14/2020   MSPIKE Not Observed 08/14/2020   SPEI Comment 05/21/2020     Chemistry      Component Value Date/Time   NA 139 08/14/2020 0810   NA 143 02/22/2017 0744   K 4.0 08/14/2020 0810   K 4.0 02/22/2017 0744   CL 100 08/14/2020 0810   CL 106 02/22/2017 0744   CO2 30 08/14/2020 0810   CO2 27 02/22/2017 0744   BUN 31 (H) 08/14/2020 0810   BUN 26 (H) 02/22/2017 0744    CREATININE 2.68 (H) 08/14/2020 0810   CREATININE 2.3 (H) 02/22/2017 0744      Component Value Date/Time   CALCIUM 9.8 08/14/2020 0810   CALCIUM 9.3 02/22/2017 0744   ALKPHOS 81 08/14/2020 0810   ALKPHOS 80 02/22/2017 0744   AST 20 08/14/2020 0810   ALT 15 08/14/2020 0810   ALT 22 02/22/2017 0744   BILITOT 0.4 08/14/2020 0810       Impression and Plan: Mr. Hailes is a very pleasant 70 yo African American gentleman with IgG kappa myeloma.  We will proceed with treatment today per MD.  Iron studies are pending. We will replace if needed.  No ESA needed this visit, Hgb 11.5.  Follow-up in 1 month.  He can contact our office with any questions or concerns.   Laverna Peace, NP 7/22/20228:17 AM

## 2020-09-13 NOTE — Progress Notes (Signed)
   Covid-19 Vaccination Clinic  Name:  Stokes Rattigan    MRN: [id number] DOB: 07/20/1950  09/13/2020  Mr. Agne was observed post Covid-19 immunization for 15 minutes without incident. He was provided with Vaccine Information Sheet and instruction to access the V-Safe system.   Mr. Cannedy was instructed to call 911 with any severe reactions post vaccine: Difficulty breathing  Swelling of face and throat  A fast heartbeat  A bad rash all over body  Dizziness and weakness   Immunizations Administered     Name Date Dose VIS Date Route   PFIZER Comrnaty(Gray TOP) Covid-19 Vaccine 09/13/2020  2:01 PM 0.3 mL 02/01/2020 Intramuscular   Manufacturer: [employer]   Lot: [id number]   [place]: [phone]

## 2020-09-13 NOTE — Progress Notes (Signed)
Ok to treat with creatinine of 2.75 per Judson Roch, NP. Patient requests Aredia for bone pain. Ok per Dr [name]. dph

## 2020-09-14 LAB — IGG, IGA, IGM
IgA: 254 mg/dL (ref 61–437)
IgG (Immunoglobin G), Serum: 1044 mg/dL (ref 603–1613)
IgM (Immunoglobulin M), Srm: 31 mg/dL (ref 15–143)

## 2020-09-14 LAB — TESTOSTERONE: Testosterone: 453 ng/dL (ref 264–916)

## 2020-09-16 ENCOUNTER — Encounter: Payer: Self-pay | Admitting: Hematology & Oncology

## 2020-09-16 ENCOUNTER — Encounter: Payer: Self-pay | Admitting: Family

## 2020-09-16 ENCOUNTER — Other Ambulatory Visit (HOSPITAL_BASED_OUTPATIENT_CLINIC_OR_DEPARTMENT_OTHER): Payer: Self-pay

## 2020-09-16 LAB — KAPPA/LAMBDA LIGHT CHAINS
Kappa free light chain: 44.1 mg/L — ABNORMAL HIGH (ref 3.3–19.4)
Kappa, lambda light chain ratio: 1.99 — ABNORMAL HIGH (ref 0.26–1.65)
Lambda free light chains: 22.2 mg/L (ref 5.7–26.3)

## 2020-09-16 LAB — PROTEIN ELECTROPHORESIS, SERUM
A/G Ratio: 1.4 (ref 0.7–1.7)
Albumin ELP: 4 g/dL (ref 2.9–4.4)
Alpha-1-Globulin: 0.2 g/dL (ref 0.0–0.4)
Alpha-2-Globulin: 0.8 g/dL (ref 0.4–1.0)
Beta Globulin: 0.9 g/dL (ref 0.7–1.3)
Gamma Globulin: 0.9 g/dL (ref 0.4–1.8)
Globulin, Total: 2.8 g/dL (ref 2.2–3.9)
Total Protein ELP: 6.8 g/dL (ref 6.0–8.5)

## 2020-09-16 MED ORDER — COVID-19 MRNA VAC-TRIS(PFIZER) 30 MCG/0.3ML IM SUSP
INTRAMUSCULAR | 0 refills | Status: DC
  Filled 2020-09-16: qty 0.3, 1d supply, fill #0

## 2020-10-14 ENCOUNTER — Ambulatory Visit: Payer: 59

## 2020-10-14 ENCOUNTER — Inpatient Hospital Stay: Payer: 59 | Attending: Hematology & Oncology

## 2020-10-14 ENCOUNTER — Inpatient Hospital Stay: Payer: 59

## 2020-10-14 ENCOUNTER — Ambulatory Visit: Payer: 59 | Admitting: Family

## 2020-10-14 ENCOUNTER — Encounter: Payer: Self-pay | Admitting: Hematology & Oncology

## 2020-10-14 ENCOUNTER — Other Ambulatory Visit: Payer: 59

## 2020-10-14 ENCOUNTER — Other Ambulatory Visit: Payer: Self-pay

## 2020-10-14 ENCOUNTER — Inpatient Hospital Stay (HOSPITAL_BASED_OUTPATIENT_CLINIC_OR_DEPARTMENT_OTHER): Payer: 59 | Admitting: Hematology & Oncology

## 2020-10-14 DIAGNOSIS — E119 Type 2 diabetes mellitus without complications: Secondary | ICD-10-CM | POA: Insufficient documentation

## 2020-10-14 DIAGNOSIS — N189 Chronic kidney disease, unspecified: Secondary | ICD-10-CM | POA: Insufficient documentation

## 2020-10-14 DIAGNOSIS — E349 Endocrine disorder, unspecified: Secondary | ICD-10-CM

## 2020-10-14 DIAGNOSIS — Z5112 Encounter for antineoplastic immunotherapy: Secondary | ICD-10-CM | POA: Insufficient documentation

## 2020-10-14 DIAGNOSIS — M2550 Pain in unspecified joint: Secondary | ICD-10-CM

## 2020-10-14 DIAGNOSIS — D631 Anemia in chronic kidney disease: Secondary | ICD-10-CM

## 2020-10-14 DIAGNOSIS — C9000 Multiple myeloma not having achieved remission: Secondary | ICD-10-CM

## 2020-10-14 DIAGNOSIS — Z299 Encounter for prophylactic measures, unspecified: Secondary | ICD-10-CM

## 2020-10-14 DIAGNOSIS — Z5111 Encounter for antineoplastic chemotherapy: Secondary | ICD-10-CM | POA: Insufficient documentation

## 2020-10-14 DIAGNOSIS — E291 Testicular hypofunction: Secondary | ICD-10-CM | POA: Insufficient documentation

## 2020-10-14 DIAGNOSIS — D500 Iron deficiency anemia secondary to blood loss (chronic): Secondary | ICD-10-CM

## 2020-10-14 DIAGNOSIS — M4712 Other spondylosis with myelopathy, cervical region: Secondary | ICD-10-CM

## 2020-10-14 LAB — CBC WITH DIFFERENTIAL (CANCER CENTER ONLY)
Abs Immature Granulocytes: 0.05 10*3/uL (ref 0.00–0.07)
Basophils Absolute: 0.1 10*3/uL (ref 0.0–0.1)
Basophils Relative: 1 %
Eosinophils Absolute: 0.1 10*3/uL (ref 0.0–0.5)
Eosinophils Relative: 3 %
HCT: 35.4 % — ABNORMAL LOW (ref 39.0–52.0)
Hemoglobin: 11.4 g/dL — ABNORMAL LOW (ref 13.0–17.0)
Immature Granulocytes: 1 %
Lymphocytes Relative: 20 %
Lymphs Abs: 0.9 10*3/uL (ref 0.7–4.0)
MCH: 25.8 pg — ABNORMAL LOW (ref 26.0–34.0)
MCHC: 32.2 g/dL (ref 30.0–36.0)
MCV: 80.1 fL (ref 80.0–100.0)
Monocytes Absolute: 0.7 10*3/uL (ref 0.1–1.0)
Monocytes Relative: 16 %
Neutro Abs: 2.6 10*3/uL (ref 1.7–7.7)
Neutrophils Relative %: 59 %
Platelet Count: 167 10*3/uL (ref 150–400)
RBC: 4.42 MIL/uL (ref 4.22–5.81)
RDW: 15.9 % — ABNORMAL HIGH (ref 11.5–15.5)
WBC Count: 4.5 10*3/uL (ref 4.0–10.5)
nRBC: 0 % (ref 0.0–0.2)

## 2020-10-14 LAB — CMP (CANCER CENTER ONLY)
ALT: 21 U/L (ref 0–44)
AST: 24 U/L (ref 15–41)
Albumin: 4.2 g/dL (ref 3.5–5.0)
Alkaline Phosphatase: 73 U/L (ref 38–126)
Anion gap: 8 (ref 5–15)
BUN: 26 mg/dL — ABNORMAL HIGH (ref 8–23)
CO2: 30 mmol/L (ref 22–32)
Calcium: 9.3 mg/dL (ref 8.9–10.3)
Chloride: 100 mmol/L (ref 98–111)
Creatinine: 2.71 mg/dL — ABNORMAL HIGH (ref 0.61–1.24)
GFR, Estimated: 24 mL/min — ABNORMAL LOW (ref >60)
Glucose, Bld: 120 mg/dL — ABNORMAL HIGH (ref 70–99)
Potassium: 4.4 mmol/L (ref 3.5–5.1)
Sodium: 138 mmol/L (ref 135–145)
Total Bilirubin: 0.5 mg/dL (ref 0.3–1.2)
Total Protein: 7.2 g/dL (ref 6.5–8.1)

## 2020-10-14 LAB — IRON AND TIBC
Iron: 127 ug/dL (ref 42–163)
Saturation Ratios: 50 % (ref 20–55)
TIBC: 251 ug/dL (ref 202–409)
UIBC: 125 ug/dL (ref 117–376)

## 2020-10-14 LAB — RETICULOCYTES
Immature Retic Fract: 8.5 % (ref 2.3–15.9)
RBC.: 4.45 MIL/uL (ref 4.22–5.81)
Retic Count, Absolute: 33.4 10*3/uL (ref 19.0–186.0)
Retic Ct Pct: 0.8 % (ref 0.4–3.1)

## 2020-10-14 LAB — FERRITIN: Ferritin: 2310 ng/mL — ABNORMAL HIGH (ref 24–336)

## 2020-10-14 LAB — LACTATE DEHYDROGENASE: LDH: 212 U/L — ABNORMAL HIGH (ref 98–192)

## 2020-10-14 MED ORDER — SODIUM CHLORIDE 0.9 % IV SOLN
60.0000 mg | Freq: Once | INTRAVENOUS | Status: AC
  Administered 2020-10-14: 60 mg via INTRAVENOUS
  Filled 2020-10-14: qty 10

## 2020-10-14 MED ORDER — DEXTROSE 5 % IV SOLN
28.6000 mg/m2 | Freq: Once | INTRAVENOUS | Status: AC
  Administered 2020-10-14: 60 mg via INTRAVENOUS
  Filled 2020-10-14: qty 30

## 2020-10-14 MED ORDER — HEPARIN SOD (PORK) LOCK FLUSH 100 UNIT/ML IV SOLN
500.0000 [IU] | Freq: Once | INTRAVENOUS | Status: AC | PRN
  Administered 2020-10-14: 500 [IU]

## 2020-10-14 MED ORDER — SODIUM CHLORIDE 0.9% FLUSH
10.0000 mL | INTRAVENOUS | Status: DC | PRN
  Administered 2020-10-14: 10 mL

## 2020-10-14 MED ORDER — SODIUM CHLORIDE 0.9 % IV SOLN
10.0000 mg | Freq: Once | INTRAVENOUS | Status: AC
  Administered 2020-10-14: 10 mg via INTRAVENOUS
  Filled 2020-10-14: qty 10

## 2020-10-14 MED ORDER — HYDROMORPHONE HCL 1 MG/ML IJ SOLN
2.0000 mg | Freq: Once | INTRAMUSCULAR | Status: AC
  Administered 2020-10-14: 2 mg via INTRAVENOUS
  Filled 2020-10-14: qty 2

## 2020-10-14 MED ORDER — SODIUM CHLORIDE 0.9 % IV SOLN: Freq: Once | INTRAVENOUS | Status: AC

## 2020-10-14 MED ORDER — SODIUM CHLORIDE 0.9 % IV SOLN
300.0000 mg/m2 | Freq: Once | INTRAVENOUS | Status: AC
  Administered 2020-10-14: 620 mg via INTRAVENOUS
  Filled 2020-10-14: qty 31

## 2020-10-14 MED ORDER — PALONOSETRON HCL INJECTION 0.25 MG/5ML
0.2500 mg | Freq: Once | INTRAVENOUS | Status: AC
  Administered 2020-10-14: 0.25 mg via INTRAVENOUS
  Filled 2020-10-14: qty 5

## 2020-10-14 MED ORDER — TESTOSTERONE CYPIONATE 200 MG/ML IM SOLN
400.0000 mg | Freq: Once | INTRAMUSCULAR | Status: AC
  Administered 2020-10-14: 400 mg via INTRAMUSCULAR
  Filled 2020-10-14: qty 2

## 2020-10-14 NOTE — Patient Instructions (Signed)
[facility]  Discharge Instructions: Thank you for choosing [facility] to provide your oncology and hematology care.   If you have a lab appointment with the [facility], please go directly to the [facility] and check in at the registration area.  Wear comfortable clothing and clothing appropriate for easy access to any Portacath or PICC line.   We strive to give you quality time with your provider. You may need to reschedule your appointment if you arrive late (15 or more minutes).  Arriving late affects you and other patients whose appointments are after yours.  Also, if you miss three or more appointments without notifying the office, you may be dismissed from the clinic at the provider's discretion.      For prescription refill requests, have your pharmacy contact our office and allow 72 hours for refills to be completed.    Today you received the following chemotherapy and/or immunotherapy agents Cytoxan, Kyprolis      To help prevent nausea and vomiting after your treatment, we encourage you to take your nausea medication as directed.  BELOW ARE SYMPTOMS THAT SHOULD BE REPORTED IMMEDIATELY: *FEVER GREATER THAN 100.4 F (38 C) OR HIGHER *CHILLS OR SWEATING *NAUSEA AND VOMITING THAT IS NOT CONTROLLED WITH YOUR NAUSEA MEDICATION *UNUSUAL SHORTNESS OF BREATH *UNUSUAL BRUISING OR BLEEDING *URINARY PROBLEMS (pain or burning when urinating, or frequent urination) *BOWEL PROBLEMS (unusual diarrhea, constipation, pain near the anus) TENDERNESS IN MOUTH AND THROAT WITH OR WITHOUT PRESENCE OF ULCERS (sore throat, sores in mouth, or a toothache) UNUSUAL RASH, SWELLING OR PAIN  UNUSUAL VAGINAL DISCHARGE OR ITCHING   Items with * indicate a potential emergency and should be followed up as soon as possible or go to the Emergency Department if any problems should occur.  Please show the CHEMOTHERAPY ALERT CARD or IMMUNOTHERAPY ALERT CARD at check-in  to the Emergency Department and triage nurse. Should you have questions after your visit or need to cancel or reschedule your appointment, please contact [facility]  [phone] and follow the prompts.  Office hours are 8:00 a.m. to 4:30 p.m. Monday - Friday. Please note that voicemails left after 4:00 p.m. may not be returned until the following business day.  We are closed weekends and major holidays. You have access to a nurse at all times for urgent questions. Please call the main number to the clinic [phone] and follow the prompts.  For any non-urgent questions, you may also contact your provider using MyChart. We now offer e-Visits for anyone 39 and older to request care online for non-urgent symptoms. For details visit [email or web address].   Also download the MyChart app! Go to the app store, search "MyChart", open the app, select [facility], and log in with your MyChart username and password.  Due to Covid, a mask is required upon entering the hospital/clinic. If you do not have a mask, one will be given to you upon arrival. For doctor visits, patients may have 1 support person aged 75 or older with them. For treatment visits, patients cannot have anyone with them due to current Covid guidelines and our immunocompromised population.

## 2020-10-14 NOTE — Progress Notes (Signed)
Ok to treat with Creatinine 2.7 per Dr [name]

## 2020-10-14 NOTE — Progress Notes (Signed)
Hematology and Oncology Follow Up Visit  [name] [id number] March 30, 1950 70 y.o. 10/14/2020   Principle Diagnosis:  IgG kappa myeloma Anemia secondary to renal insufficiency Intermittent iron - deficiency anemia Hypotestosteronemia   Current Therapy:        Aredia 60 mg IV q  months -- Retacrit SQ as needed for hemoglobin less than 10 DepoTestosterone 400 mg q 4 weeks IV iron as indicated Kyprolis/Cytoxan - s/p cycle 29   Interim History:  [name] is here today for follow-up and treatment.  The weather change has been a bit tough on him.  I think we will do his Aredia monthly.  This does seem to help with all of his bony issues.  He does quite a bit of bony issues secondary to his fibromyalgia.  He has done well with respect to the myeloma.  Last time we saw him, there was no monoclonal spike in his blood.  His IgG level was 1044 mg/dL.  The Kappa light chain was 4.4 mg/dL.  He does have diabetes.  His blood sugar this morning was actually pretty good at 120.  His iron studies are been doing okay.  Today, his ferritin was 2300 with an iron saturation of 50%.  There has been no problems with fever.  Thankfully, he has not had COVID.  He has had no problems with nausea or vomiting.  He has had no issues with bowels or bladder.  There is been no bleeding.  Overall, I would have to say his performance status is probably ECOG 1.   Medications:  Allergies as of 10/14/2020       Reactions   Iodinated Diagnostic Agents Rash   Patient states he was instructed not to take IV contrast.  In 2008 he had an unknown reaction, and was told not to take it again.  He was also told not to take it due to his kidneys.   Iodine Anxiety, Rash, Other (See Comments)   Didn't feel right "instructed not to take per MD--something with his port"        Medication List        Accurate as of October 14, 2020  2:11 PM. If you have any questions, ask your nurse or doctor.           STOP taking these medications    cephALEXin 500 MG capsule Commonly known as: KEFLEX Stopped by: Volanda Napoleon, MD   Pfizer-BioNT COVID-19 Vac-TriS Susp injection Generic drug: COVID-19 mRNA Vac-TriS Therapist, music) Stopped by: Volanda Napoleon, MD       TAKE these medications    amLODipine 10 MG tablet Commonly known as: NORVASC Take 10 mg by mouth daily.   atorvastatin 40 MG tablet Commonly known as: LIPITOR Take 40 mg by mouth daily at 6 PM.   Carafate 1 GM/10ML suspension Generic drug: sucralfate Take 1 g by mouth 4 (four) times daily.   carvedilol 25 MG tablet Commonly known as: COREG Take 25 mg by mouth 2 (two) times daily.   CVS Fiber Gummies 2 g Chew Chew by mouth daily.   dicyclomine 10 MG capsule Commonly known as: BENTYL TAKE 1 CAPSULE (10 MG TOTAL) BY MOUTH 3 (THREE) TIMES DAILY BEFORE MEALS.   dronabinol 2.5 MG capsule Commonly known as: MARINOL Take 2.5 mg by mouth 2 (two) times daily before lunch and supper.   gabapentin 300 MG capsule Commonly known as: NEURONTIN TAKE 1 CAPSULE BY MOUTH THREE TIMES A DAY What changed:  how much  to take how to take this when to take this additional instructions   glimepiride 2 MG tablet Commonly known as: AMARYL Take 2 mg by mouth daily.   hydrocortisone 2.5 % cream Apply topically.   lactulose 10 GM/15ML solution Commonly known as: CHRONULAC Take 30 mLs (20 g total) by mouth daily.   latanoprost 0.005 % ophthalmic solution Commonly known as: XALATAN 1 drop at bedtime.   linaclotide 145 MCG Caps capsule Commonly known as: LINZESS Take by mouth daily as needed.   losartan 50 MG tablet Commonly known as: COZAAR Take 50 mg by mouth daily.   Lumigan 0.01 % Soln Generic drug: bimatoprost Place 1 drop into both eyes 3 (three) times daily.   metoprolol succinate 25 MG 24 hr tablet Commonly known as: TOPROL-XL Take 25 mg by mouth daily.   mirabegron ER 50 MG Tb24 tablet Commonly known as:  MYRBETRIQ Take 50 mg by mouth daily.   morphine 30 MG 12 hr tablet Commonly known as: MS CONTIN Take 30 mg by mouth 2 (two) times daily.   omeprazole 40 MG capsule Commonly known as: PRILOSEC TAKE 1 CAPSULE BY MOUTH TWICE A DAY   ondansetron 8 MG tablet Commonly known as: ZOFRAN TAKE 1 TABLET(8 MG) BY MOUTH TWICE DAILY AS NEEDED FOR NAUSEA OR VOMITING   oxyCODONE 5 MG immediate release tablet Commonly known as: Roxicodone 1 tab PO q6 hours prn pain   oxyCODONE-acetaminophen 10-325 MG tablet Commonly known as: PERCOCET Take 1 tablet by mouth every 6 (six) hours as needed for pain.   pioglitazone 30 MG tablet Commonly known as: ACTOS Take 30 mg by mouth daily.   prochlorperazine 10 MG tablet Commonly known as: COMPAZINE take 1 tablet by mouth every 6 hours if needed for nausea and vomiting   Restasis 0.05 % ophthalmic emulsion Generic drug: cycloSPORINE Place 1 drop into both eyes daily.   SUMAtriptan 100 MG tablet Commonly known as: IMITREX Take 100 mg by mouth daily.   tadalafil 10 MG tablet Commonly known as: CIALIS Take by mouth.   topiramate 25 MG capsule Commonly known as: TOPAMAX Take 25 mg by mouth 2 (two) times daily.   torsemide 20 MG tablet Commonly known as: DEMADEX Take 40 mg by mouth daily.   Trulicity 2.84 XL/2.4MW Sopn Generic drug: Dulaglutide Inject 0.75 mg into the skin every Saturday.   Vitamin D (Ergocalciferol) 1.25 MG (50000 UNIT) Caps capsule Commonly known as: DRISDOL Take 50,000 Units by mouth every Saturday.        Allergies:  Allergies  Allergen Reactions   Iodinated Diagnostic Agents Rash    Patient states he was instructed not to take IV contrast.  In 2008 he had an unknown reaction, and was told not to take it again.  He was also told not to take it due to his kidneys.   Iodine Anxiety, Rash and Other (See Comments)    Didn't feel right "instructed not to take per MD--something with his port"     Past Medical  History, Surgical history, Social history, and Family History were reviewed and updated.  Review of Systems: Review of Systems  Constitutional: Negative.   HENT: Negative.    Eyes: Negative.   Respiratory: Negative.    Cardiovascular: Negative.   Gastrointestinal: Negative.   Genitourinary: Negative.   Musculoskeletal:  Positive for back pain, joint pain and neck pain.  Skin: Negative.   Neurological: Negative.   Endo/Heme/Allergies: Negative.   Psychiatric/Behavioral: Negative.      Physical  Exam:  vitals were not taken for this visit.   Wt Readings from Last 3 Encounters:  10/14/20 184 lb (83.5 kg)  09/13/20 187 lb 11.2 oz (85.1 kg)  07/16/20 183 lb (83 kg)   His vital signs show temperature of 98.4.  Pulse 66.  Blood pressure is 132/59.  Weight is 184 pounds.   Physical Exam Vitals reviewed.  HENT:     Head: Normocephalic and atraumatic.  Eyes:     Pupils: Pupils are equal, round, and reactive to light.  Cardiovascular:     Rate and Rhythm: Normal rate and regular rhythm.     Heart sounds: Normal heart sounds.  Pulmonary:     Effort: Pulmonary effort is normal.     Breath sounds: Normal breath sounds.  Abdominal:     General: Bowel sounds are normal.     Palpations: Abdomen is soft.  Musculoskeletal:        General: No tenderness or deformity. Normal range of motion.     Cervical back: Normal range of motion.  Lymphadenopathy:     Cervical: No cervical adenopathy.  Skin:    General: Skin is warm and dry.     Findings: No erythema or rash.  Neurological:     Mental Status: He is alert and oriented to person, place, and time.  Psychiatric:        Behavior: Behavior normal.        Thought Content: Thought content normal.        Judgment: Judgment normal.    Lab Results  Component Value Date   WBC 4.5 10/14/2020   HGB 11.4 (L) 10/14/2020   HCT 35.4 (L) 10/14/2020   MCV 80.1 10/14/2020   PLT 167 10/14/2020   Lab Results  Component Value Date    FERRITIN 2,310 (H) 10/14/2020   IRON 127 10/14/2020   TIBC 251 10/14/2020   UIBC 125 10/14/2020   IRONPCTSAT 50 10/14/2020   Lab Results  Component Value Date   RETICCTPCT 0.8 10/14/2020   RBC 4.45 10/14/2020   RETICCTABS 27.5 03/21/2014   Lab Results  Component Value Date   KPAFRELGTCHN 44.1 (H) 09/13/2020   LAMBDASER 22.2 09/13/2020   KAPLAMBRATIO 1.99 (H) 09/13/2020   Lab Results  Component Value Date   IGGSERUM 1,044 09/13/2020   IGA 254 09/13/2020   IGMSERUM 31 09/13/2020   Lab Results  Component Value Date   TOTALPROTELP 6.8 09/13/2020   ALBUMINELP 4.0 09/13/2020   A1GS 0.2 09/13/2020   A2GS 0.8 09/13/2020   BETS 0.9 09/13/2020   BETA2SER 0.5 11/07/2014   GAMS 0.9 09/13/2020   MSPIKE Not Observed 09/13/2020   SPEI Comment 09/13/2020     Chemistry      Component Value Date/Time   NA 138 10/14/2020 0741   NA 143 02/22/2017 0744   K 4.4 10/14/2020 0741   K 4.0 02/22/2017 0744   CL 100 10/14/2020 0741   CL 106 02/22/2017 0744   CO2 30 10/14/2020 0741   CO2 27 02/22/2017 0744   BUN 26 (H) 10/14/2020 0741   BUN 26 (H) 02/22/2017 0744   CREATININE 2.71 (H) 10/14/2020 0741   CREATININE 2.3 (H) 02/22/2017 0744      Component Value Date/Time   CALCIUM 9.3 10/14/2020 0741   CALCIUM 9.3 02/22/2017 0744   ALKPHOS 73 10/14/2020 0741   ALKPHOS 80 02/22/2017 0744   AST 24 10/14/2020 0741   ALT 21 10/14/2020 0741   ALT 22 02/22/2017 0744   BILITOT  0.5 10/14/2020 0741       Impression and Plan: [name] is a very pleasant 70 yo African American gentleman with IgG kappa myeloma.   Again, he is done incredibly well with respect to therapy.  His myeloma really has not shown any evidence of progression.  His problems are clearly are not hematologic.  I am happy about that part.  His blood sugars are under fairly good control which is certainly helpful.  We will continue to treat him monthly.  The monthly protocol does seem to work well for him.  I just hope  that his bony issues are under better control.  I know that with the hot humid weather this does tend to cause more problems for him .   Volanda Napoleon, MD 8/22/20222:11 PM

## 2020-10-15 LAB — IGG, IGA, IGM
IgA: 249 mg/dL (ref 61–437)
IgG (Immunoglobin G), Serum: 1037 mg/dL (ref 603–1613)
IgM (Immunoglobulin M), Srm: 38 mg/dL (ref 15–143)

## 2020-10-15 LAB — TESTOSTERONE: Testosterone: 389 ng/dL (ref 264–916)

## 2020-10-15 LAB — KAPPA/LAMBDA LIGHT CHAINS
Kappa free light chain: 45.8 mg/L — ABNORMAL HIGH (ref 3.3–19.4)
Kappa, lambda light chain ratio: 1.88 — ABNORMAL HIGH (ref 0.26–1.65)
Lambda free light chains: 24.4 mg/L (ref 5.7–26.3)

## 2020-10-16 LAB — PROTEIN ELECTROPHORESIS, SERUM
A/G Ratio: 1.2 (ref 0.7–1.7)
Albumin ELP: 3.6 g/dL (ref 2.9–4.4)
Alpha-1-Globulin: 0.2 g/dL (ref 0.0–0.4)
Alpha-2-Globulin: 0.9 g/dL (ref 0.4–1.0)
Beta Globulin: 1 g/dL (ref 0.7–1.3)
Gamma Globulin: 1 g/dL (ref 0.4–1.8)
Globulin, Total: 3.1 g/dL (ref 2.2–3.9)
Total Protein ELP: 6.7 g/dL (ref 6.0–8.5)

## 2020-10-25 ENCOUNTER — Ambulatory Visit (INDEPENDENT_AMBULATORY_CARE_PROVIDER_SITE_OTHER): Payer: 59 | Admitting: Podiatry

## 2020-10-25 ENCOUNTER — Other Ambulatory Visit: Payer: Self-pay

## 2020-10-25 ENCOUNTER — Encounter: Payer: Self-pay | Admitting: Podiatry

## 2020-10-25 DIAGNOSIS — E1151 Type 2 diabetes mellitus with diabetic peripheral angiopathy without gangrene: Secondary | ICD-10-CM | POA: Diagnosis not present

## 2020-10-25 DIAGNOSIS — B351 Tinea unguium: Secondary | ICD-10-CM

## 2020-10-25 DIAGNOSIS — M79674 Pain in right toe(s): Secondary | ICD-10-CM

## 2020-10-25 DIAGNOSIS — M79675 Pain in left toe(s): Secondary | ICD-10-CM | POA: Diagnosis not present

## 2020-10-25 NOTE — Progress Notes (Signed)
This patient returns to my office for at risk foot care.  This patient requires this care by a professional since this patient will be at risk due to having diabetes type 2.  This patient is unable to cut nails himself since the patient cannot reach his nails.These nails are painful walking and wearing shoes.  This patient presents for at risk foot care today.  General Appearance  Alert, conversant and in no acute stress.  Vascular  Dorsalis pedis and posterior tibial  pulses are weakly  palpable  bilaterally.  Capillary return is within normal limits  bilaterally. Cold feet bilaterally.  Absent digital hair.  Neurologic  Senn-Weinstein monofilament wire test within normal limits  bilaterally. Muscle power within normal limits bilaterally.  Nails Thick disfigured discolored nails with subungual debris  from hallux to fifth toes bilaterally. No evidence of bacterial infection or drainage bilaterally.  Orthopedic  No limitations of motion  feet .  No crepitus or effusions noted.  No bony pathology or digital deformities noted.  HAV  B/L.  Exostosis right foot.  Skin  normotropic skin with no porokeratosis noted bilaterally.  No signs of infections or ulcers noted.  Corn lateral aspect fourth toe right foot. No infection.  Onychomycosis  Pain in right toes  Pain in left toes  Consent was obtained for treatment procedures.   Mechanical debridement of nails 1-5  bilaterally performed with a nail nipper.  Filed with dremel without incident. Debride corn fourth toe right foot lateral border.   Return office visit    10 weeks                  Told patient to return for periodic foot care and evaluation due to potential at risk complications.   Gardiner Barefoot DPM

## 2020-11-12 ENCOUNTER — Inpatient Hospital Stay: Payer: 59

## 2020-11-12 ENCOUNTER — Inpatient Hospital Stay (HOSPITAL_BASED_OUTPATIENT_CLINIC_OR_DEPARTMENT_OTHER): Payer: 59 | Admitting: Hematology & Oncology

## 2020-11-12 ENCOUNTER — Inpatient Hospital Stay: Payer: 59 | Attending: Hematology & Oncology

## 2020-11-12 ENCOUNTER — Other Ambulatory Visit: Payer: Self-pay

## 2020-11-12 ENCOUNTER — Encounter: Payer: Self-pay | Admitting: Hematology & Oncology

## 2020-11-12 ENCOUNTER — Telehealth: Payer: Self-pay

## 2020-11-12 VITALS — BP 129/59 | HR 80 | Temp 99.0°F | Resp 17 | Wt 186.8 lb

## 2020-11-12 DIAGNOSIS — M4712 Other spondylosis with myelopathy, cervical region: Secondary | ICD-10-CM

## 2020-11-12 DIAGNOSIS — C9000 Multiple myeloma not having achieved remission: Secondary | ICD-10-CM

## 2020-11-12 DIAGNOSIS — D631 Anemia in chronic kidney disease: Secondary | ICD-10-CM | POA: Insufficient documentation

## 2020-11-12 DIAGNOSIS — Z299 Encounter for prophylactic measures, unspecified: Secondary | ICD-10-CM | POA: Diagnosis not present

## 2020-11-12 DIAGNOSIS — E349 Endocrine disorder, unspecified: Secondary | ICD-10-CM

## 2020-11-12 DIAGNOSIS — Z5112 Encounter for antineoplastic immunotherapy: Secondary | ICD-10-CM | POA: Diagnosis present

## 2020-11-12 DIAGNOSIS — E291 Testicular hypofunction: Secondary | ICD-10-CM | POA: Diagnosis not present

## 2020-11-12 DIAGNOSIS — Z5111 Encounter for antineoplastic chemotherapy: Secondary | ICD-10-CM | POA: Insufficient documentation

## 2020-11-12 DIAGNOSIS — N189 Chronic kidney disease, unspecified: Secondary | ICD-10-CM | POA: Diagnosis not present

## 2020-11-12 DIAGNOSIS — M2550 Pain in unspecified joint: Secondary | ICD-10-CM

## 2020-11-12 LAB — CMP (CANCER CENTER ONLY)
ALT: 22 U/L (ref 0–44)
AST: 25 U/L (ref 15–41)
Albumin: 4.3 g/dL (ref 3.5–5.0)
Alkaline Phosphatase: 81 U/L (ref 38–126)
Anion gap: 9 (ref 5–15)
BUN: 28 mg/dL — ABNORMAL HIGH (ref 8–23)
CO2: 30 mmol/L (ref 22–32)
Calcium: 9 mg/dL (ref 8.9–10.3)
Chloride: 103 mmol/L (ref 98–111)
Creatinine: 2.42 mg/dL — ABNORMAL HIGH (ref 0.61–1.24)
GFR, Estimated: 28 mL/min — ABNORMAL LOW (ref >60)
Glucose, Bld: 131 mg/dL — ABNORMAL HIGH (ref 70–99)
Potassium: 3.7 mmol/L (ref 3.5–5.1)
Sodium: 142 mmol/L (ref 135–145)
Total Bilirubin: 0.4 mg/dL (ref 0.3–1.2)
Total Protein: 6.9 g/dL (ref 6.5–8.1)

## 2020-11-12 LAB — CBC WITH DIFFERENTIAL (CANCER CENTER ONLY)
Abs Immature Granulocytes: 0.03 10*3/uL (ref 0.00–0.07)
Basophils Absolute: 0.1 10*3/uL (ref 0.0–0.1)
Basophils Relative: 2 %
Eosinophils Absolute: 0.2 10*3/uL (ref 0.0–0.5)
Eosinophils Relative: 4 %
HCT: 33.6 % — ABNORMAL LOW (ref 39.0–52.0)
Hemoglobin: 10.5 g/dL — ABNORMAL LOW (ref 13.0–17.0)
Immature Granulocytes: 1 %
Lymphocytes Relative: 20 %
Lymphs Abs: 0.9 10*3/uL (ref 0.7–4.0)
MCH: 25 pg — ABNORMAL LOW (ref 26.0–34.0)
MCHC: 31.3 g/dL (ref 30.0–36.0)
MCV: 80 fL (ref 80.0–100.0)
Monocytes Absolute: 0.6 10*3/uL (ref 0.1–1.0)
Monocytes Relative: 13 %
Neutro Abs: 2.7 10*3/uL (ref 1.7–7.7)
Neutrophils Relative %: 60 %
Platelet Count: 177 10*3/uL (ref 150–400)
RBC: 4.2 MIL/uL — ABNORMAL LOW (ref 4.22–5.81)
RDW: 15.8 % — ABNORMAL HIGH (ref 11.5–15.5)
WBC Count: 4.4 10*3/uL (ref 4.0–10.5)
nRBC: 0 % (ref 0.0–0.2)

## 2020-11-12 LAB — RETICULOCYTES
Immature Retic Fract: 13.9 % (ref 2.3–15.9)
RBC.: 4.12 MIL/uL — ABNORMAL LOW (ref 4.22–5.81)
Retic Count, Absolute: 35.4 10*3/uL (ref 19.0–186.0)
Retic Ct Pct: 0.9 % (ref 0.4–3.1)

## 2020-11-12 LAB — LACTATE DEHYDROGENASE: LDH: 219 U/L — ABNORMAL HIGH (ref 98–192)

## 2020-11-12 MED ORDER — SODIUM CHLORIDE 0.9% FLUSH
10.0000 mL | INTRAVENOUS | Status: DC | PRN
  Administered 2020-11-12: 10 mL

## 2020-11-12 MED ORDER — SODIUM CHLORIDE 0.9 % IV SOLN
60.0000 mg | Freq: Once | INTRAVENOUS | Status: AC
  Administered 2020-11-12: 60 mg via INTRAVENOUS
  Filled 2020-11-12: qty 10

## 2020-11-12 MED ORDER — TESTOSTERONE CYPIONATE 200 MG/ML IM SOLN: 400.0000 mg | Freq: Once | INTRAMUSCULAR | Status: DC

## 2020-11-12 MED ORDER — TESTOSTERONE CYPIONATE 200 MG/ML IM SOLN
400.0000 mg | Freq: Once | INTRAMUSCULAR | Status: AC
  Administered 2020-11-12: 400 mg via INTRAMUSCULAR
  Filled 2020-11-12: qty 2

## 2020-11-12 MED ORDER — PALONOSETRON HCL INJECTION 0.25 MG/5ML
0.2500 mg | Freq: Once | INTRAVENOUS | Status: AC
  Administered 2020-11-12: 0.25 mg via INTRAVENOUS

## 2020-11-12 MED ORDER — HYDROMORPHONE HCL 1 MG/ML IJ SOLN
INTRAMUSCULAR | Status: AC
  Filled 2020-11-12: qty 2

## 2020-11-12 MED ORDER — SODIUM CHLORIDE 0.9 % IV SOLN: Freq: Once | INTRAVENOUS | Status: AC

## 2020-11-12 MED ORDER — DEXTROSE 5 % IV SOLN
29.0000 mg/m2 | Freq: Once | INTRAVENOUS | Status: AC
  Administered 2020-11-12: 60 mg via INTRAVENOUS
  Filled 2020-11-12: qty 30

## 2020-11-12 MED ORDER — SODIUM CHLORIDE 0.9 % IV SOLN
10.0000 mg | Freq: Once | INTRAVENOUS | Status: AC
  Administered 2020-11-12: 10 mg via INTRAVENOUS
  Filled 2020-11-12: qty 10

## 2020-11-12 MED ORDER — SODIUM CHLORIDE 0.9 % IV SOLN
Freq: Once | INTRAVENOUS | Status: AC
Start: 2020-11-12 — End: 2020-11-12

## 2020-11-12 MED ORDER — HYDROMORPHONE HCL 1 MG/ML IJ SOLN
2.0000 mg | Freq: Once | INTRAMUSCULAR | Status: AC
  Administered 2020-11-12: 2 mg via INTRAVENOUS

## 2020-11-12 MED ORDER — SODIUM CHLORIDE 0.9 % IV SOLN
300.0000 mg/m2 | Freq: Once | INTRAVENOUS | Status: AC
  Administered 2020-11-12: 620 mg via INTRAVENOUS
  Filled 2020-11-12: qty 31

## 2020-11-12 MED ORDER — HEPARIN SOD (PORK) LOCK FLUSH 100 UNIT/ML IV SOLN
500.0000 [IU] | Freq: Once | INTRAVENOUS | Status: AC | PRN
  Administered 2020-11-12: 500 [IU]

## 2020-11-12 NOTE — Telephone Encounter (Signed)
Appts made per 11/12/20 los and pt to gain updated schedule at [facility] and through [facility] in [place]  [name]

## 2020-11-12 NOTE — Progress Notes (Signed)
Hematology and Oncology Follow Up Visit  [name] [id number] 1950-03-16 70 y.o. 11/12/2020   Principle Diagnosis:  IgG kappa myeloma Anemia secondary to renal insufficiency Intermittent iron - deficiency anemia Hypotestosteronemia   Current Therapy:        Aredia 60 mg IV q  month -- Retacrit SQ as needed for hemoglobin less than 10 DepoTestosterone 400 mg q 4 weeks IV iron as indicated Kyprolis/Cytoxan - s/p cycle 30   Interim History:  [name] is here today for follow-up and treatment.  [name] is doing pretty well.  He really has not had a lot of complaints about arthritis.  So far, the Kyprolis/Cytoxan has helped quite a bit.  There is no monoclonal spike in his blood.  His IgG level is 1037 mg/dL.  The Kappa light chain is 4.6 mg/dL.  He does get testosterone.  This does make his quality life better.  We do do Aredia monthly.  This really helps with his arthritis.  I think he will need some Retacrit today.  His hemoglobin is down a little bit.  We are checking his iron levels.  He has been complained of some palpitations.  We will get an echocardiogram on him.  His last one was 2 years ago.  There is been no change in bowel or bladder habits.  Overall, I would say his performance status is ECOG 1.    Medications:  Allergies as of 11/12/2020       Reactions   Iodinated Diagnostic Agents Rash   Patient states he was instructed not to take IV contrast.  In 2008 he had an unknown reaction, and was told not to take it again.  He was also told not to take it due to his kidneys.   Iodine Anxiety, Rash, Other (See Comments)   Didn't feel right "instructed not to take per MD--something with his port"        Medication List        Accurate as of November 12, 2020  9:33 AM. If you have any questions, ask your nurse or doctor.          amLODipine 10 MG tablet Commonly known as: NORVASC Take 10 mg by mouth daily.   atorvastatin 40 MG tablet Commonly known  as: LIPITOR Take 40 mg by mouth daily at 6 PM.   Carafate 1 GM/10ML suspension Generic drug: sucralfate Take 1 g by mouth 4 (four) times daily.   carvedilol 25 MG tablet Commonly known as: COREG Take 25 mg by mouth 2 (two) times daily.   [employer] Fiber Gummies 2 g Chew Chew by mouth daily.   dicyclomine 10 MG capsule Commonly known as: BENTYL TAKE 1 CAPSULE (10 MG TOTAL) BY MOUTH 3 (THREE) TIMES DAILY BEFORE MEALS.   dronabinol 2.5 MG capsule Commonly known as: MARINOL Take 2.5 mg by mouth 2 (two) times daily before lunch and supper.   gabapentin 300 MG capsule Commonly known as: NEURONTIN TAKE 1 CAPSULE BY MOUTH THREE TIMES A DAY What changed:  how much to take how to take this when to take this additional instructions   glimepiride 2 MG tablet Commonly known as: AMARYL Take 2 mg by mouth daily.   hydrocortisone 2.5 % cream Apply topically.   lactulose 10 GM/15ML solution Commonly known as: CHRONULAC Take 30 mLs (20 g total) by mouth daily.   latanoprost 0.005 % ophthalmic solution Commonly known as: XALATAN 1 drop at bedtime.   linaclotide 145 MCG Caps capsule Commonly known  as: LINZESS Take by mouth daily as needed.   losartan 50 MG tablet Commonly known as: COZAAR Take 50 mg by mouth daily.   Lumigan 0.01 % Soln Generic drug: bimatoprost Place 1 drop into both eyes 3 (three) times daily.   metoprolol succinate 25 MG 24 hr tablet Commonly known as: TOPROL-XL Take 25 mg by mouth daily.   mirabegron ER 50 MG Tb24 tablet Commonly known as: MYRBETRIQ Take 50 mg by mouth daily.   morphine 30 MG 12 hr tablet Commonly known as: MS CONTIN Take 30 mg by mouth 2 (two) times daily.   omeprazole 40 MG capsule Commonly known as: PRILOSEC TAKE 1 CAPSULE BY MOUTH TWICE A DAY   ondansetron 8 MG tablet Commonly known as: ZOFRAN TAKE 1 TABLET(8 MG) BY MOUTH TWICE DAILY AS NEEDED FOR NAUSEA OR VOMITING   oxyCODONE 5 MG immediate release tablet Commonly known  as: Roxicodone 1 tab PO q6 hours prn pain   oxyCODONE-acetaminophen 10-325 MG tablet Commonly known as: PERCOCET Take 1 tablet by mouth every 6 (six) hours as needed for pain.   pioglitazone 30 MG tablet Commonly known as: ACTOS Take 30 mg by mouth daily.   prochlorperazine 10 MG tablet Commonly known as: COMPAZINE take 1 tablet by mouth every 6 hours if needed for nausea and vomiting   Restasis 0.05 % ophthalmic emulsion Generic drug: cycloSPORINE Place 1 drop into both eyes daily.   SUMAtriptan 100 MG tablet Commonly known as: IMITREX Take 100 mg by mouth daily.   tadalafil 10 MG tablet Commonly known as: CIALIS Take by mouth.   topiramate 25 MG capsule Commonly known as: TOPAMAX Take 25 mg by mouth 2 (two) times daily.   torsemide 20 MG tablet Commonly known as: DEMADEX Take 40 mg by mouth daily.   Trulicity 8.31 DV/7.6HY Sopn Generic drug: Dulaglutide Inject 0.75 mg into the skin every Saturday.   Vitamin D (Ergocalciferol) 1.25 MG (50000 UNIT) Caps capsule Commonly known as: DRISDOL Take 50,000 Units by mouth every Saturday.        Allergies:  Allergies  Allergen Reactions   Iodinated Diagnostic Agents Rash    Patient states he was instructed not to take IV contrast.  In 2008 he had an unknown reaction, and was told not to take it again.  He was also told not to take it due to his kidneys.   Iodine Anxiety, Rash and Other (See Comments)    Didn't feel right "instructed not to take per MD--something with his port"     Past Medical History, Surgical history, Social history, and Family History were reviewed and updated.  Review of Systems: Review of Systems  Constitutional: Negative.   HENT: Negative.    Eyes: Negative.   Respiratory: Negative.    Cardiovascular: Negative.   Gastrointestinal: Negative.   Genitourinary: Negative.   Musculoskeletal:  Positive for back pain, joint pain and neck pain.  Skin: Negative.   Neurological: Negative.    Endo/Heme/Allergies: Negative.   Psychiatric/Behavioral: Negative.      Physical Exam:  weight is 186 lb 12 oz (84.7 kg). His oral temperature is 99 F (37.2 C). His blood pressure is 129/59 (abnormal) and his pulse is 80. His respiration is 17 and oxygen saturation is 100%.   Wt Readings from Last 3 Encounters:  11/12/20 186 lb 12 oz (84.7 kg)  11/12/20 186 lb 1.9 oz (84.4 kg)  10/14/20 184 lb (83.5 kg)   His vital signs show temperature of 98.4.  Pulse  66.  Blood pressure is 132/59.  Weight is 184 pounds.   Physical Exam Vitals reviewed.  HENT:     Head: Normocephalic and atraumatic.  Eyes:     Pupils: Pupils are equal, round, and reactive to light.  Cardiovascular:     Rate and Rhythm: Normal rate and regular rhythm.     Heart sounds: Normal heart sounds.  Pulmonary:     Effort: Pulmonary effort is normal.     Breath sounds: Normal breath sounds.  Abdominal:     General: Bowel sounds are normal.     Palpations: Abdomen is soft.  Musculoskeletal:        General: No tenderness or deformity. Normal range of motion.     Cervical back: Normal range of motion.  Lymphadenopathy:     Cervical: No cervical adenopathy.  Skin:    General: Skin is warm and dry.     Findings: No erythema or rash.  Neurological:     Mental Status: He is alert and oriented to person, place, and time.  Psychiatric:        Behavior: Behavior normal.        Thought Content: Thought content normal.        Judgment: Judgment normal.    Lab Results  Component Value Date   WBC 4.4 11/12/2020   HGB 10.5 (L) 11/12/2020   HCT 33.6 (L) 11/12/2020   MCV 80.0 11/12/2020   PLT 177 11/12/2020   Lab Results  Component Value Date   FERRITIN 2,310 (H) 10/14/2020   IRON 127 10/14/2020   TIBC 251 10/14/2020   UIBC 125 10/14/2020   IRONPCTSAT 50 10/14/2020   Lab Results  Component Value Date   RETICCTPCT 0.9 11/12/2020   RBC 4.20 (L) 11/12/2020   RBC 4.12 (L) 11/12/2020   RETICCTABS 27.5  03/21/2014   Lab Results  Component Value Date   KPAFRELGTCHN 45.8 (H) 10/14/2020   LAMBDASER 24.4 10/14/2020   KAPLAMBRATIO 1.88 (H) 10/14/2020   Lab Results  Component Value Date   IGGSERUM 1,037 10/14/2020   IGA 249 10/14/2020   IGMSERUM 38 10/14/2020   Lab Results  Component Value Date   TOTALPROTELP 6.7 10/14/2020   ALBUMINELP 3.6 10/14/2020   A1GS 0.2 10/14/2020   A2GS 0.9 10/14/2020   BETS 1.0 10/14/2020   BETA2SER 0.5 11/07/2014   GAMS 1.0 10/14/2020   MSPIKE Not Observed 10/14/2020   SPEI Comment 10/14/2020     Chemistry      Component Value Date/Time   NA 142 11/12/2020 0745   NA 143 02/22/2017 0744   K 3.7 11/12/2020 0745   K 4.0 02/22/2017 0744   CL 103 11/12/2020 0745   CL 106 02/22/2017 0744   CO2 30 11/12/2020 0745   CO2 27 02/22/2017 0744   BUN 28 (H) 11/12/2020 0745   BUN 26 (H) 02/22/2017 0744   CREATININE 2.42 (H) 11/12/2020 0745   CREATININE 2.3 (H) 02/22/2017 0744      Component Value Date/Time   CALCIUM 9.0 11/12/2020 0745   CALCIUM 9.3 02/22/2017 0744   ALKPHOS 81 11/12/2020 0745   ALKPHOS 80 02/22/2017 0744   AST 25 11/12/2020 0745   ALT 22 11/12/2020 0745   ALT 22 02/22/2017 0744   BILITOT 0.4 11/12/2020 0745       Impression and Plan: [name] is a very pleasant 70 yo African American gentleman with IgG kappa myeloma.   Again, he is done incredibly well with respect to therapy.  His  myeloma really has not shown any evidence of progression.  His problems are clearly are not hematologic.  I am happy about that part.  His blood sugars are under fairly good control which is certainly helpful.  We will continue to treat him monthly.  The monthly protocol does seem to work well for him.  I just hope that his bony issues are under better control.  I know that with the coming of fall and less humidity, his arthritic issues are not as bad.     Volanda Napoleon, MD 9/20/20229:33 AM

## 2020-11-12 NOTE — Patient Instructions (Signed)
[facility]  Discharge Instructions: Thank you for choosing [facility] to provide your oncology and hematology care.   If you have a lab appointment with the [facility], please go directly to the [facility] and check in at the registration area.  Wear comfortable clothing and clothing appropriate for easy access to any Portacath or PICC line.   We strive to give you quality time with your provider. You may need to reschedule your appointment if you arrive late (15 or more minutes).  Arriving late affects you and other patients whose appointments are after yours.  Also, if you miss three or more appointments without notifying the office, you may be dismissed from the clinic at the provider's discretion.      For prescription refill requests, have your pharmacy contact our office and allow 72 hours for refills to be completed.    Today you received the following chemotherapy and/or immunotherapy agents Kyprolis, Cytoxan, Aredia, testosterone.       To help prevent nausea and vomiting after your treatment, we encourage you to take your nausea medication as directed.  BELOW ARE SYMPTOMS THAT SHOULD BE REPORTED IMMEDIATELY: *FEVER GREATER THAN 100.4 F (38 C) OR HIGHER *CHILLS OR SWEATING *NAUSEA AND VOMITING THAT IS NOT CONTROLLED WITH YOUR NAUSEA MEDICATION *UNUSUAL SHORTNESS OF BREATH *UNUSUAL BRUISING OR BLEEDING *URINARY PROBLEMS (pain or burning when urinating, or frequent urination) *BOWEL PROBLEMS (unusual diarrhea, constipation, pain near the anus) TENDERNESS IN MOUTH AND THROAT WITH OR WITHOUT PRESENCE OF ULCERS (sore throat, sores in mouth, or a toothache) UNUSUAL RASH, SWELLING OR PAIN  UNUSUAL VAGINAL DISCHARGE OR ITCHING   Items with * indicate a potential emergency and should be followed up as soon as possible or go to the Emergency Department if any problems should occur.  Please show the CHEMOTHERAPY ALERT CARD or IMMUNOTHERAPY  ALERT CARD at check-in to the Emergency Department and triage nurse. Should you have questions after your visit or need to cancel or reschedule your appointment, please contact [facility]  [phone] and follow the prompts.  Office hours are 8:00 a.m. to 4:30 p.m. Monday - Friday. Please note that voicemails left after 4:00 p.m. may not be returned until the following business day.  We are closed weekends and major holidays. You have access to a nurse at all times for urgent questions. Please call the main number to the clinic [phone] and follow the prompts.  For any non-urgent questions, you may also contact your provider using MyChart. We now offer e-Visits for anyone 74 and older to request care online for non-urgent symptoms. For details visit [email or web address].   Also download the MyChart app! Go to the app store, search "MyChart", open the app, select [facility], and log in with your MyChart username and password.  Due to Covid, a mask is required upon entering the hospital/clinic. If you do not have a mask, one will be given to you upon arrival. For doctor visits, patients may have 1 support person aged 37 or older with them. For treatment visits, patients cannot have anyone with them due to current Covid guidelines and our immunocompromised population.

## 2020-11-13 LAB — KAPPA/LAMBDA LIGHT CHAINS
Kappa free light chain: 45.2 mg/L — ABNORMAL HIGH (ref 3.3–19.4)
Kappa, lambda light chain ratio: 1.86 — ABNORMAL HIGH (ref 0.26–1.65)
Lambda free light chains: 24.3 mg/L (ref 5.7–26.3)

## 2020-11-13 LAB — IRON AND TIBC
Iron: 71 ug/dL (ref 42–163)
Saturation Ratios: 32 % (ref 20–55)
TIBC: 223 ug/dL (ref 202–409)
UIBC: 151 ug/dL (ref 117–376)

## 2020-11-13 LAB — FERRITIN: Ferritin: 2122 ng/mL — ABNORMAL HIGH (ref 24–336)

## 2020-11-13 LAB — TESTOSTERONE: Testosterone: 511 ng/dL (ref 264–916)

## 2020-11-14 LAB — PROTEIN ELECTROPHORESIS, SERUM, WITH REFLEX
A/G Ratio: 1.4 (ref 0.7–1.7)
Albumin ELP: 3.8 g/dL (ref 2.9–4.4)
Alpha-1-Globulin: 0.2 g/dL (ref 0.0–0.4)
Alpha-2-Globulin: 0.8 g/dL (ref 0.4–1.0)
Beta Globulin: 0.9 g/dL (ref 0.7–1.3)
Gamma Globulin: 0.9 g/dL (ref 0.4–1.8)
Globulin, Total: 2.7 g/dL (ref 2.2–3.9)
Total Protein ELP: 6.5 g/dL (ref 6.0–8.5)

## 2020-11-14 LAB — IGG, IGA, IGM
IgA: 240 mg/dL (ref 61–437)
IgG (Immunoglobin G), Serum: 1004 mg/dL (ref 603–1613)
IgM (Immunoglobulin M), Srm: 37 mg/dL (ref 15–143)

## 2020-11-25 ENCOUNTER — Other Ambulatory Visit: Payer: Self-pay

## 2020-11-25 ENCOUNTER — Ambulatory Visit (HOSPITAL_COMMUNITY): Payer: 59 | Attending: Cardiovascular Disease

## 2020-11-25 DIAGNOSIS — I10 Essential (primary) hypertension: Secondary | ICD-10-CM | POA: Diagnosis not present

## 2020-11-25 DIAGNOSIS — I3139 Other pericardial effusion (noninflammatory): Secondary | ICD-10-CM | POA: Insufficient documentation

## 2020-11-25 DIAGNOSIS — I517 Cardiomegaly: Secondary | ICD-10-CM | POA: Diagnosis not present

## 2020-11-25 DIAGNOSIS — E785 Hyperlipidemia, unspecified: Secondary | ICD-10-CM | POA: Diagnosis not present

## 2020-11-25 DIAGNOSIS — E119 Type 2 diabetes mellitus without complications: Secondary | ICD-10-CM | POA: Insufficient documentation

## 2020-11-25 DIAGNOSIS — Z0189 Encounter for other specified special examinations: Secondary | ICD-10-CM | POA: Diagnosis not present

## 2020-11-25 DIAGNOSIS — C9000 Multiple myeloma not having achieved remission: Secondary | ICD-10-CM | POA: Diagnosis not present

## 2020-11-25 DIAGNOSIS — Z01818 Encounter for other preprocedural examination: Secondary | ICD-10-CM | POA: Diagnosis not present

## 2020-11-25 LAB — ECHOCARDIOGRAM COMPLETE
Area-P 1/2: 3.19 cm2
S' Lateral: 2.6 cm

## 2020-11-26 ENCOUNTER — Telehealth: Payer: Self-pay

## 2020-11-26 NOTE — Telephone Encounter (Signed)
Called and informed patient of lab results, patient verbalized understanding and denies any questions or concerns at this time.   

## 2020-11-26 NOTE — Telephone Encounter (Signed)
-----   Message from Volanda Napoleon, MD sent at 11/25/2020  5:46 PM EDT ----- Please call and tell him that his heart is pumping like a champion.  We did not see anything that looks unusual.  Also tell him that the [facility] are really horrible.  How could they have lost to the [place]???

## 2020-11-27 ENCOUNTER — Telehealth: Payer: Self-pay | Admitting: *Deleted

## 2020-11-27 NOTE — Telephone Encounter (Signed)
Patient confirmed rescheduling of appointment 

## 2020-12-10 ENCOUNTER — Other Ambulatory Visit: Payer: Self-pay | Admitting: *Deleted

## 2020-12-10 ENCOUNTER — Other Ambulatory Visit: Payer: Self-pay

## 2020-12-10 ENCOUNTER — Inpatient Hospital Stay: Payer: 59 | Attending: Hematology & Oncology

## 2020-12-10 ENCOUNTER — Ambulatory Visit: Payer: 59 | Attending: Internal Medicine

## 2020-12-10 ENCOUNTER — Encounter: Payer: Self-pay | Admitting: Hematology & Oncology

## 2020-12-10 ENCOUNTER — Inpatient Hospital Stay: Payer: 59

## 2020-12-10 ENCOUNTER — Inpatient Hospital Stay (HOSPITAL_BASED_OUTPATIENT_CLINIC_OR_DEPARTMENT_OTHER): Payer: 59 | Admitting: Hematology & Oncology

## 2020-12-10 VITALS — BP 155/77 | HR 86 | Temp 98.3°F | Resp 18 | Wt 183.2 lb

## 2020-12-10 DIAGNOSIS — M4712 Other spondylosis with myelopathy, cervical region: Secondary | ICD-10-CM

## 2020-12-10 DIAGNOSIS — C9000 Multiple myeloma not having achieved remission: Secondary | ICD-10-CM

## 2020-12-10 DIAGNOSIS — N189 Chronic kidney disease, unspecified: Secondary | ICD-10-CM | POA: Diagnosis not present

## 2020-12-10 DIAGNOSIS — E291 Testicular hypofunction: Secondary | ICD-10-CM | POA: Diagnosis not present

## 2020-12-10 DIAGNOSIS — Z299 Encounter for prophylactic measures, unspecified: Secondary | ICD-10-CM

## 2020-12-10 DIAGNOSIS — D631 Anemia in chronic kidney disease: Secondary | ICD-10-CM

## 2020-12-10 DIAGNOSIS — Z5112 Encounter for antineoplastic immunotherapy: Secondary | ICD-10-CM | POA: Insufficient documentation

## 2020-12-10 DIAGNOSIS — Z5111 Encounter for antineoplastic chemotherapy: Secondary | ICD-10-CM | POA: Insufficient documentation

## 2020-12-10 DIAGNOSIS — E349 Endocrine disorder, unspecified: Secondary | ICD-10-CM

## 2020-12-10 DIAGNOSIS — Z23 Encounter for immunization: Secondary | ICD-10-CM

## 2020-12-10 LAB — CMP (CANCER CENTER ONLY)
ALT: 21 U/L (ref 0–44)
AST: 17 U/L (ref 15–41)
Albumin: 4.2 g/dL (ref 3.5–5.0)
Alkaline Phosphatase: 57 U/L (ref 38–126)
Anion gap: 9 (ref 5–15)
BUN: 30 mg/dL — ABNORMAL HIGH (ref 8–23)
CO2: 29 mmol/L (ref 22–32)
Calcium: 9.3 mg/dL (ref 8.9–10.3)
Chloride: 101 mmol/L (ref 98–111)
Creatinine: 2.17 mg/dL — ABNORMAL HIGH (ref 0.61–1.24)
GFR, Estimated: 32 mL/min — ABNORMAL LOW (ref >60)
Glucose, Bld: 177 mg/dL — ABNORMAL HIGH (ref 70–99)
Potassium: 4.3 mmol/L (ref 3.5–5.1)
Sodium: 139 mmol/L (ref 135–145)
Total Bilirubin: 0.6 mg/dL (ref 0.3–1.2)
Total Protein: 7.1 g/dL (ref 6.5–8.1)

## 2020-12-10 LAB — CBC WITH DIFFERENTIAL (CANCER CENTER ONLY)
Abs Immature Granulocytes: 0.07 10*3/uL (ref 0.00–0.07)
Basophils Absolute: 0.1 10*3/uL (ref 0.0–0.1)
Basophils Relative: 1 %
Eosinophils Absolute: 0.1 10*3/uL (ref 0.0–0.5)
Eosinophils Relative: 2 %
HCT: 34.2 % — ABNORMAL LOW (ref 39.0–52.0)
Hemoglobin: 11 g/dL — ABNORMAL LOW (ref 13.0–17.0)
Immature Granulocytes: 1 %
Lymphocytes Relative: 18 %
Lymphs Abs: 0.9 10*3/uL (ref 0.7–4.0)
MCH: 25.7 pg — ABNORMAL LOW (ref 26.0–34.0)
MCHC: 32.2 g/dL (ref 30.0–36.0)
MCV: 79.9 fL — ABNORMAL LOW (ref 80.0–100.0)
Monocytes Absolute: 0.6 10*3/uL (ref 0.1–1.0)
Monocytes Relative: 11 %
Neutro Abs: 3.2 10*3/uL (ref 1.7–7.7)
Neutrophils Relative %: 67 %
Platelet Count: 148 10*3/uL — ABNORMAL LOW (ref 150–400)
RBC: 4.28 MIL/uL (ref 4.22–5.81)
RDW: 16.9 % — ABNORMAL HIGH (ref 11.5–15.5)
WBC Count: 4.9 10*3/uL (ref 4.0–10.5)
nRBC: 0 % (ref 0.0–0.2)

## 2020-12-10 LAB — IRON AND TIBC
Iron: 116 ug/dL (ref 42–163)
Saturation Ratios: 50 % (ref 20–55)
TIBC: 231 ug/dL (ref 202–409)
UIBC: 115 ug/dL — ABNORMAL LOW (ref 117–376)

## 2020-12-10 LAB — LACTATE DEHYDROGENASE: LDH: 201 U/L — ABNORMAL HIGH (ref 98–192)

## 2020-12-10 LAB — RETICULOCYTES
Immature Retic Fract: 7.2 % (ref 2.3–15.9)
RBC.: 4.26 MIL/uL (ref 4.22–5.81)
Retic Count, Absolute: 29.8 10*3/uL (ref 19.0–186.0)
Retic Ct Pct: 0.7 % (ref 0.4–3.1)

## 2020-12-10 LAB — FERRITIN: Ferritin: 2136 ng/mL — ABNORMAL HIGH (ref 24–336)

## 2020-12-10 MED ORDER — TESTOSTERONE CYPIONATE 200 MG/ML IM SOLN
400.0000 mg | Freq: Once | INTRAMUSCULAR | Status: AC
  Administered 2020-12-10: 400 mg via INTRAMUSCULAR
  Filled 2020-12-10: qty 2

## 2020-12-10 MED ORDER — SODIUM CHLORIDE 0.9 % IV SOLN
300.0000 mg/m2 | Freq: Once | INTRAVENOUS | Status: AC
  Administered 2020-12-10: 620 mg via INTRAVENOUS
  Filled 2020-12-10: qty 31

## 2020-12-10 MED ORDER — HEPARIN SOD (PORK) LOCK FLUSH 100 UNIT/ML IV SOLN
500.0000 [IU] | Freq: Once | INTRAVENOUS | Status: AC | PRN
  Administered 2020-12-10: 500 [IU]

## 2020-12-10 MED ORDER — SODIUM CHLORIDE 0.9 % IV SOLN
60.0000 mg | Freq: Once | INTRAVENOUS | Status: AC
  Administered 2020-12-10: 60 mg via INTRAVENOUS
  Filled 2020-12-10: qty 10

## 2020-12-10 MED ORDER — SODIUM CHLORIDE 0.9 % IV SOLN: Freq: Once | INTRAVENOUS | Status: AC

## 2020-12-10 MED ORDER — PROCHLORPERAZINE MALEATE 10 MG PO TABS: ORAL_TABLET | ORAL | 1 refills | Status: DC

## 2020-12-10 MED ORDER — ONDANSETRON HCL 8 MG PO TABS: ORAL_TABLET | ORAL | 3 refills | Status: DC

## 2020-12-10 MED ORDER — HYDROMORPHONE HCL 1 MG/ML IJ SOLN
2.0000 mg | Freq: Once | INTRAMUSCULAR | Status: AC
  Administered 2020-12-10: 2 mg via INTRAVENOUS
  Filled 2020-12-10: qty 2

## 2020-12-10 MED ORDER — SODIUM CHLORIDE 0.9% FLUSH
10.0000 mL | INTRAVENOUS | Status: DC | PRN
  Administered 2020-12-10: 10 mL

## 2020-12-10 MED ORDER — DEXAMETHASONE SODIUM PHOSPHATE 100 MG/10ML IJ SOLN
10.0000 mg | Freq: Once | INTRAMUSCULAR | Status: AC
  Administered 2020-12-10: 10 mg via INTRAVENOUS
  Filled 2020-12-10: qty 10

## 2020-12-10 MED ORDER — DEXTROSE 5 % IV SOLN
29.0000 mg/m2 | Freq: Once | INTRAVENOUS | Status: AC
  Administered 2020-12-10: 60 mg via INTRAVENOUS
  Filled 2020-12-10: qty 30

## 2020-12-10 MED ORDER — PALONOSETRON HCL INJECTION 0.25 MG/5ML
0.2500 mg | Freq: Once | INTRAVENOUS | Status: AC
  Administered 2020-12-10: 0.25 mg via INTRAVENOUS
  Filled 2020-12-10: qty 5

## 2020-12-10 NOTE — Progress Notes (Signed)
   Covid-19 Vaccination Clinic  Name:  [name]    MRN: [id number] DOB: 1951/01/07  12/10/2020  Mr. Matusek was observed post Covid-19 immunization for 15 minutes without incident. He was provided with Vaccine Information Sheet and instruction to access the V-Safe system.   Mr. Hantz was instructed to call 911 with any severe reactions post vaccine: Difficulty breathing  Swelling of face and throat  A fast heartbeat  A bad rash all over body  Dizziness and weakness

## 2020-12-10 NOTE — Patient Instructions (Signed)
Tunneled Central Venous Catheter Flushing Guide It is important to flush your tunneled central venous catheter each time you use it, both before and after you use it. Flushing your catheter will help prevent it from clogging. What are the risks? Risks may include: Infection. Air getting into the catheter and bloodstream. Supplies needed: A clean pair of gloves. A disinfecting wipe. Use an alcohol wipe, chlorhexidine wipe, or iodine wipe as told by your health care provider. A 10 mL syringe that has been prefilled with saline solution. An empty 10 mL syringe, if a substance called heparin was injected into your catheter. How to flush your catheter When you flush your catheter, make sure you follow any specific instructions from your health care provider or the manufacturer. These are general guidelines. Flushing your catheter before use If there is heparin in your catheter: Wash your hands with soap and water. Put on gloves. Scrub the injection cap for a minimum of 15 seconds with a disinfecting wipe. Unclamp the catheter. Attach the empty syringe to the injection cap. Pull the syringe plunger back and withdraw 10 mL of blood. Place the syringe into an appropriate waste container. Scrub the injection cap for 15 seconds with a disinfecting wipe. Attach the prefilled syringe to the injection cap. Flush the catheter by pushing the plunger forward until all the liquid from the syringe is in the catheter. Remove the syringe from the injection cap. Clamp the catheter. If there is no heparin in your catheter: Wash your hands with soap and water. Put on gloves. Scrub the injection cap for 15 seconds with a disinfecting wipe. Unclamp the catheter. Attach the prefilled syringe to the injection cap. Flush the catheter by pushing the plunger forward until 5 mL of the liquid from the syringe is in the catheter. Pull back on the syringe until you see blood in the catheter. If you have been asked  to collect any blood, follow your health care provider's instructions. Otherwise, flush the catheter with the rest of the solution from the syringe. Remove the syringe from the injection cap. Clamp the catheter.  Flushing your catheter after use Wash your hands with soap and water. Put on gloves. Scrub the injection cap for 15 seconds with a disinfecting wipe. Unclamp the catheter. Attach the prefilled syringe to the injection cap. Flush the catheter by pushing the plunger forward until all of the liquid from the syringe is in the catheter. Remove the syringe from the injection cap. Clamp the catheter. Problems and solutions If blood cannot be completely cleared from the injection cap, you may need to have the injection cap replaced. If the catheter is difficult to flush, use the pulsing method. The pulsing method involves pushing only a few milliliters of solution into the catheter at a time and pausing between pushes. If you do not see blood in the catheter when you pull back on the syringe, change your body position, such as by raising your arms above your head. Take a deep breath and cough. Then, pull back on the syringe. If you still do not see blood, flush the catheter with a small amount of solution. Then, change positions again and take a breath or cough. Pull back on the syringe again. If you still do not see blood, finish flushing the catheter and contact your health care provider. Do not use your catheter until your health care provider says it is okay. General tips Have someone help you flush your catheter, if possible. Do not force fluid   through your catheter. Do not use a syringe that is larger or smaller than 10 mL. Using a smaller syringe can make the catheter burst. Do not use your catheter without flushing it first if it has heparin in it. Contact a health care provider if: You cannot see any blood in the catheter when you flush it before using it. Your catheter is difficult  to flush. Get help right away if: You cannot flush the catheter. The catheter leaks when you flush it or when there is fluid in it. There are cracks or breaks in the catheter. Summary It is important to flush your tunneled central venous catheter each time you use it, both before and after you use it. Scrub the injection cap for 15 seconds with a disinfecting wipe before and after you flush it. When you flush your catheter, make sure you follow any specific instructions from your health care provider or the manufacturer. Get help right away if you cannot flush the catheter. This information is not intended to replace advice given to you by your health care provider. Make sure you discuss any questions you have with your health care provider. Document Revised: 04/20/2019 Document Reviewed: 04/27/2018 Elsevier Patient Education  2022 [employer]  

## 2020-12-10 NOTE — Progress Notes (Signed)
Hematology and Oncology Follow Up Visit  [name] [id number] Mar 31, 1950 70 y.o. 12/10/2020   Principle Diagnosis:  IgG kappa myeloma Anemia secondary to renal insufficiency Intermittent iron - deficiency anemia Hypotestosteronemia   Current Therapy:        Aredia 60 mg IV q  month -- Retacrit SQ as needed for hemoglobin less than 10 DepoTestosterone 400 mg q 4 weeks IV iron as indicated Kyprolis/Cytoxan - s/p cycle 31   Interim History:  [name] is here today for follow-up and treatment.  Overall, he seems doing a little bit better.  I think that the Aredia monthly does seem to help him out.  He is done nicely with respect to the Kyprolis/Cytoxan combination.  Last time we checked his monoclonal protein levels, there was no monoclonal spike in his blood.  The IgG level was 1000 mg/dL.  His kappa light chain was 4.5 mg/dL.  He is still dealing with his arthritis.  Again, I think that the Aredia helps his.  We are giving him his testosterone.  I think this is done every month.  His last testosterone level was 511 ng/dL.  His blood sugars have always been a problem.  He is trying his best to control them.  He has had no issues with nausea or vomiting.  He is eating a little bit better.  We did do an echocardiogram on him last month.  He had a ejection fraction of 55-60%.  He has had no issues with fever.  There is been no problems with COVID.  Overall, his performance status is ECOG 1.     Medications:  Allergies as of 12/10/2020       Reactions   Iodinated Diagnostic Agents Rash   Patient states he was instructed not to take IV contrast.  In 2008 he had an unknown reaction, and was told not to take it again.  He was also told not to take it due to his kidneys.   Iodine Anxiety, Rash, Other (See Comments)   Didn't feel right "instructed not to take per MD--something with his port"        Medication List        Accurate as of December 10, 2020  9:06 AM.  If you have any questions, ask your nurse or doctor.          amLODipine 10 MG tablet Commonly known as: NORVASC Take 10 mg by mouth daily.   atorvastatin 40 MG tablet Commonly known as: LIPITOR Take 40 mg by mouth daily at 6 PM.   Carafate 1 GM/10ML suspension Generic drug: sucralfate Take 1 g by mouth 4 (four) times daily.   carvedilol 25 MG tablet Commonly known as: COREG Take 25 mg by mouth 2 (two) times daily.   [employer] Fiber Gummies 2 g Chew Chew by mouth daily.   dicyclomine 10 MG capsule Commonly known as: BENTYL TAKE 1 CAPSULE (10 MG TOTAL) BY MOUTH 3 (THREE) TIMES DAILY BEFORE MEALS.   dronabinol 2.5 MG capsule Commonly known as: MARINOL Take 2.5 mg by mouth 2 (two) times daily before lunch and supper.   gabapentin 300 MG capsule Commonly known as: NEURONTIN TAKE 1 CAPSULE BY MOUTH THREE TIMES A DAY What changed:  how much to take how to take this when to take this additional instructions   glimepiride 2 MG tablet Commonly known as: AMARYL Take 2 mg by mouth daily.   hydrocortisone 2.5 % cream Apply topically.   lactulose 10 GM/15ML solution Commonly  known as: CHRONULAC Take 30 mLs (20 g total) by mouth daily.   latanoprost 0.005 % ophthalmic solution Commonly known as: XALATAN 1 drop at bedtime.   linaclotide 145 MCG Caps capsule Commonly known as: LINZESS Take by mouth daily as needed.   losartan 50 MG tablet Commonly known as: COZAAR Take 50 mg by mouth daily.   Lumigan 0.01 % Soln Generic drug: bimatoprost Place 1 drop into both eyes 3 (three) times daily.   metFORMIN 500 MG tablet Commonly known as: GLUCOPHAGE Take 500 mg by mouth 2 (two) times daily.   metoprolol succinate 25 MG 24 hr tablet Commonly known as: TOPROL-XL Take 25 mg by mouth daily.   mirabegron ER 50 MG Tb24 tablet Commonly known as: MYRBETRIQ Take 50 mg by mouth daily.   morphine 30 MG 12 hr tablet Commonly known as: MS CONTIN Take 30 mg by mouth 2 (two)  times daily.   omeprazole 40 MG capsule Commonly known as: PRILOSEC TAKE 1 CAPSULE BY MOUTH TWICE A DAY   ondansetron 8 MG tablet Commonly known as: ZOFRAN TAKE 1 TABLET(8 MG) BY MOUTH TWICE DAILY AS NEEDED FOR NAUSEA OR VOMITING   oxyCODONE 5 MG immediate release tablet Commonly known as: Roxicodone 1 tab PO q6 hours prn pain   oxyCODONE-acetaminophen 10-325 MG tablet Commonly known as: PERCOCET Take 1 tablet by mouth every 6 (six) hours as needed for pain.   pioglitazone 30 MG tablet Commonly known as: ACTOS Take 30 mg by mouth daily.   prochlorperazine 10 MG tablet Commonly known as: COMPAZINE take 1 tablet by mouth every 6 hours if needed for nausea and vomiting   Restasis 0.05 % ophthalmic emulsion Generic drug: cycloSPORINE Place 1 drop into both eyes daily.   SUMAtriptan 100 MG tablet Commonly known as: IMITREX Take 100 mg by mouth daily.   tadalafil 10 MG tablet Commonly known as: CIALIS Take by mouth.   topiramate 25 MG capsule Commonly known as: TOPAMAX Take 25 mg by mouth 2 (two) times daily.   torsemide 20 MG tablet Commonly known as: DEMADEX Take 40 mg by mouth daily.   Trulicity 2.95 MW/4.1LK Sopn Generic drug: Dulaglutide Inject 0.75 mg into the skin every Saturday.   Vitamin D (Ergocalciferol) 1.25 MG (50000 UNIT) Caps capsule Commonly known as: DRISDOL Take 50,000 Units by mouth every Saturday.        Allergies:  Allergies  Allergen Reactions   Iodinated Diagnostic Agents Rash    Patient states he was instructed not to take IV contrast.  In 2008 he had an unknown reaction, and was told not to take it again.  He was also told not to take it due to his kidneys.   Iodine Anxiety, Rash and Other (See Comments)    Didn't feel right "instructed not to take per MD--something with his port"     Past Medical History, Surgical history, Social history, and Family History were reviewed and updated.  Review of Systems: Review of Systems   Constitutional: Negative.   HENT: Negative.    Eyes: Negative.   Respiratory: Negative.    Cardiovascular: Negative.   Gastrointestinal: Negative.   Genitourinary: Negative.   Musculoskeletal:  Positive for back pain, joint pain and neck pain.  Skin: Negative.   Neurological: Negative.   Endo/Heme/Allergies: Negative.   Psychiatric/Behavioral: Negative.      Physical Exam:  weight is 183 lb 3 oz (83.1 kg). His oral temperature is 98.3 F (36.8 C). His blood pressure is 155/77 (abnormal)  and his pulse is 86. His respiration is 18 and oxygen saturation is 96%.   Wt Readings from Last 3 Encounters:  12/10/20 183 lb 3 oz (83.1 kg)  11/12/20 186 lb 12 oz (84.7 kg)  11/12/20 186 lb 1.9 oz (84.4 kg)   His vital signs show temperature of 98.4.  Pulse 66.  Blood pressure is 132/59.  Weight is 184 pounds.   Physical Exam Vitals reviewed.  HENT:     Head: Normocephalic and atraumatic.  Eyes:     Pupils: Pupils are equal, round, and reactive to light.  Cardiovascular:     Rate and Rhythm: Normal rate and regular rhythm.     Heart sounds: Normal heart sounds.  Pulmonary:     Effort: Pulmonary effort is normal.     Breath sounds: Normal breath sounds.  Abdominal:     General: Bowel sounds are normal.     Palpations: Abdomen is soft.  Musculoskeletal:        General: No tenderness or deformity. Normal range of motion.     Cervical back: Normal range of motion.  Lymphadenopathy:     Cervical: No cervical adenopathy.  Skin:    General: Skin is warm and dry.     Findings: No erythema or rash.  Neurological:     Mental Status: He is alert and oriented to person, place, and time.  Psychiatric:        Behavior: Behavior normal.        Thought Content: Thought content normal.        Judgment: Judgment normal.    Lab Results  Component Value Date   WBC 4.9 12/10/2020   HGB 11.0 (L) 12/10/2020   HCT 34.2 (L) 12/10/2020   MCV 79.9 (L) 12/10/2020   PLT 148 (L) 12/10/2020    Lab Results  Component Value Date   FERRITIN 2,122 (H) 11/12/2020   IRON 71 11/12/2020   TIBC 223 11/12/2020   UIBC 151 11/12/2020   IRONPCTSAT 32 11/12/2020   Lab Results  Component Value Date   RETICCTPCT 0.7 12/10/2020   RBC 4.26 12/10/2020   RBC 4.28 12/10/2020   RETICCTABS 27.5 03/21/2014   Lab Results  Component Value Date   KPAFRELGTCHN 45.2 (H) 11/12/2020   LAMBDASER 24.3 11/12/2020   KAPLAMBRATIO 1.86 (H) 11/12/2020   Lab Results  Component Value Date   IGGSERUM 1,004 11/12/2020   IGA 240 11/12/2020   IGMSERUM 37 11/12/2020   Lab Results  Component Value Date   TOTALPROTELP 6.5 11/12/2020   ALBUMINELP 3.8 11/12/2020   A1GS 0.2 11/12/2020   A2GS 0.8 11/12/2020   BETS 0.9 11/12/2020   BETA2SER 0.5 11/07/2014   GAMS 0.9 11/12/2020   MSPIKE Not Observed 11/12/2020   SPEI Comment 10/14/2020     Chemistry      Component Value Date/Time   NA 139 12/10/2020 0753   NA 143 02/22/2017 0744   K 4.3 12/10/2020 0753   K 4.0 02/22/2017 0744   CL 101 12/10/2020 0753   CL 106 02/22/2017 0744   CO2 29 12/10/2020 0753   CO2 27 02/22/2017 0744   BUN 30 (H) 12/10/2020 0753   BUN 26 (H) 02/22/2017 0744   CREATININE 2.17 (H) 12/10/2020 0753   CREATININE 2.3 (H) 02/22/2017 0744      Component Value Date/Time   CALCIUM 9.3 12/10/2020 0753   CALCIUM 9.3 02/22/2017 0744   ALKPHOS 57 12/10/2020 0753   ALKPHOS 80 02/22/2017 0744   AST 17 12/10/2020  0753   ALT 21 12/10/2020 0753   ALT 22 02/22/2017 0744   BILITOT 0.6 12/10/2020 0753       Impression and Plan: [name] is a very pleasant 70 yo African American gentleman with IgG kappa myeloma.   Again, he is done incredibly well with respect to therapy.  His myeloma really has not shown any evidence of progression.  His problems are clearly are not hematologic.  I am happy about that part.  His blood sugars are under fairly good control which is certainly helpful.  We will continue to treat him monthly.  The  monthly protocol does seem to work well for him.    Volanda Napoleon, MD 10/18/20229:06 AM

## 2020-12-10 NOTE — Progress Notes (Signed)
OK to treat with creatinine-2.17 per order of Dr. [name].

## 2020-12-11 LAB — KAPPA/LAMBDA LIGHT CHAINS
Kappa free light chain: 34.1 mg/L — ABNORMAL HIGH (ref 3.3–19.4)
Kappa, lambda light chain ratio: 1.84 — ABNORMAL HIGH (ref 0.26–1.65)
Lambda free light chains: 18.5 mg/L (ref 5.7–26.3)

## 2020-12-11 LAB — IGG, IGA, IGM
IgA: 249 mg/dL (ref 61–437)
IgG (Immunoglobin G), Serum: 1004 mg/dL (ref 603–1613)
IgM (Immunoglobulin M), Srm: 32 mg/dL (ref 15–143)

## 2020-12-11 LAB — TESTOSTERONE: Testosterone: 297 ng/dL (ref 264–916)

## 2020-12-13 LAB — PROTEIN ELECTROPHORESIS, SERUM, WITH REFLEX
A/G Ratio: 1.6 (ref 0.7–1.7)
Albumin ELP: 4 g/dL (ref 2.9–4.4)
Alpha-1-Globulin: 0.2 g/dL (ref 0.0–0.4)
Alpha-2-Globulin: 0.7 g/dL (ref 0.4–1.0)
Beta Globulin: 0.9 g/dL (ref 0.7–1.3)
Gamma Globulin: 0.8 g/dL (ref 0.4–1.8)
Globulin, Total: 2.5 g/dL (ref 2.2–3.9)
Total Protein ELP: 6.5 g/dL (ref 6.0–8.5)

## 2021-01-03 ENCOUNTER — Ambulatory Visit: Payer: 59 | Admitting: Podiatry

## 2021-01-07 ENCOUNTER — Ambulatory Visit: Payer: 59

## 2021-01-07 ENCOUNTER — Other Ambulatory Visit: Payer: 59

## 2021-01-07 ENCOUNTER — Ambulatory Visit: Payer: 59 | Admitting: Hematology & Oncology

## 2021-01-07 ENCOUNTER — Other Ambulatory Visit (HOSPITAL_BASED_OUTPATIENT_CLINIC_OR_DEPARTMENT_OTHER): Payer: Self-pay

## 2021-01-07 MED ORDER — PFIZER COVID-19 VAC BIVALENT 30 MCG/0.3ML IM SUSP
INTRAMUSCULAR | 0 refills | Status: DC
  Filled 2021-01-07: qty 0.3, 1d supply, fill #0

## 2021-01-08 ENCOUNTER — Inpatient Hospital Stay: Payer: 59

## 2021-01-08 ENCOUNTER — Telehealth: Payer: Self-pay | Admitting: *Deleted

## 2021-01-08 ENCOUNTER — Encounter: Payer: Self-pay | Admitting: Family

## 2021-01-08 ENCOUNTER — Inpatient Hospital Stay: Payer: 59 | Attending: Hematology & Oncology

## 2021-01-08 ENCOUNTER — Other Ambulatory Visit: Payer: Self-pay

## 2021-01-08 ENCOUNTER — Inpatient Hospital Stay (HOSPITAL_BASED_OUTPATIENT_CLINIC_OR_DEPARTMENT_OTHER): Payer: 59 | Admitting: Family

## 2021-01-08 VITALS — BP 123/80 | HR 88 | Temp 98.2°F | Resp 17 | Ht 71.0 in | Wt 190.0 lb

## 2021-01-08 DIAGNOSIS — E291 Testicular hypofunction: Secondary | ICD-10-CM | POA: Diagnosis not present

## 2021-01-08 DIAGNOSIS — C9000 Multiple myeloma not having achieved remission: Secondary | ICD-10-CM

## 2021-01-08 DIAGNOSIS — E349 Endocrine disorder, unspecified: Secondary | ICD-10-CM

## 2021-01-08 DIAGNOSIS — Z5111 Encounter for antineoplastic chemotherapy: Secondary | ICD-10-CM | POA: Insufficient documentation

## 2021-01-08 DIAGNOSIS — D631 Anemia in chronic kidney disease: Secondary | ICD-10-CM | POA: Diagnosis not present

## 2021-01-08 DIAGNOSIS — N189 Chronic kidney disease, unspecified: Secondary | ICD-10-CM

## 2021-01-08 DIAGNOSIS — M4712 Other spondylosis with myelopathy, cervical region: Secondary | ICD-10-CM

## 2021-01-08 DIAGNOSIS — Z5112 Encounter for antineoplastic immunotherapy: Secondary | ICD-10-CM | POA: Diagnosis present

## 2021-01-08 DIAGNOSIS — Z299 Encounter for prophylactic measures, unspecified: Secondary | ICD-10-CM

## 2021-01-08 DIAGNOSIS — D500 Iron deficiency anemia secondary to blood loss (chronic): Secondary | ICD-10-CM | POA: Diagnosis not present

## 2021-01-08 LAB — CBC WITH DIFFERENTIAL (CANCER CENTER ONLY)
Abs Immature Granulocytes: 0.03 10*3/uL (ref 0.00–0.07)
Basophils Absolute: 0 10*3/uL (ref 0.0–0.1)
Basophils Relative: 1 %
Eosinophils Absolute: 0.1 10*3/uL (ref 0.0–0.5)
Eosinophils Relative: 2 %
HCT: 32.8 % — ABNORMAL LOW (ref 39.0–52.0)
Hemoglobin: 10.5 g/dL — ABNORMAL LOW (ref 13.0–17.0)
Immature Granulocytes: 1 %
Lymphocytes Relative: 17 %
Lymphs Abs: 0.9 10*3/uL (ref 0.7–4.0)
MCH: 26.1 pg (ref 26.0–34.0)
MCHC: 32 g/dL (ref 30.0–36.0)
MCV: 81.6 fL (ref 80.0–100.0)
Monocytes Absolute: 0.7 10*3/uL (ref 0.1–1.0)
Monocytes Relative: 13 %
Neutro Abs: 3.5 10*3/uL (ref 1.7–7.7)
Neutrophils Relative %: 66 %
Platelet Count: 157 10*3/uL (ref 150–400)
RBC: 4.02 MIL/uL — ABNORMAL LOW (ref 4.22–5.81)
RDW: 17 % — ABNORMAL HIGH (ref 11.5–15.5)
WBC Count: 5.2 10*3/uL (ref 4.0–10.5)
nRBC: 0 % (ref 0.0–0.2)

## 2021-01-08 LAB — CMP (CANCER CENTER ONLY)
ALT: 18 U/L (ref 0–44)
AST: 23 U/L (ref 15–41)
Albumin: 4.2 g/dL (ref 3.5–5.0)
Alkaline Phosphatase: 73 U/L (ref 38–126)
Anion gap: 7 (ref 5–15)
BUN: 28 mg/dL — ABNORMAL HIGH (ref 8–23)
CO2: 31 mmol/L (ref 22–32)
Calcium: 9 mg/dL (ref 8.9–10.3)
Chloride: 102 mmol/L (ref 98–111)
Creatinine: 2.66 mg/dL — ABNORMAL HIGH (ref 0.61–1.24)
GFR, Estimated: 25 mL/min — ABNORMAL LOW (ref >60)
Glucose, Bld: 88 mg/dL (ref 70–99)
Potassium: 4.2 mmol/L (ref 3.5–5.1)
Sodium: 140 mmol/L (ref 135–145)
Total Bilirubin: 0.4 mg/dL (ref 0.3–1.2)
Total Protein: 6.9 g/dL (ref 6.5–8.1)

## 2021-01-08 LAB — IRON AND TIBC
Iron: 103 ug/dL (ref 42–163)
Saturation Ratios: 44 % (ref 20–55)
TIBC: 234 ug/dL (ref 202–409)
UIBC: 131 ug/dL (ref 117–376)

## 2021-01-08 LAB — LACTATE DEHYDROGENASE: LDH: 228 U/L — ABNORMAL HIGH (ref 98–192)

## 2021-01-08 LAB — FERRITIN: Ferritin: 2342 ng/mL — ABNORMAL HIGH (ref 24–336)

## 2021-01-08 LAB — VITAMIN B12: Vitamin B-12: 110 pg/mL — ABNORMAL LOW (ref 180–914)

## 2021-01-08 MED ORDER — SODIUM CHLORIDE 0.9 % IV SOLN
10.0000 mg | Freq: Once | INTRAVENOUS | Status: AC
  Administered 2021-01-08: 10 mg via INTRAVENOUS
  Filled 2021-01-08: qty 10

## 2021-01-08 MED ORDER — SODIUM CHLORIDE 0.9 % IV SOLN: Freq: Once | INTRAVENOUS | Status: AC

## 2021-01-08 MED ORDER — HYDROMORPHONE HCL 1 MG/ML IJ SOLN
2.0000 mg | Freq: Once | INTRAMUSCULAR | Status: AC
  Administered 2021-01-08: 2 mg via INTRAVENOUS
  Filled 2021-01-08: qty 2

## 2021-01-08 MED ORDER — PALONOSETRON HCL INJECTION 0.25 MG/5ML
0.2500 mg | Freq: Once | INTRAVENOUS | Status: AC
  Administered 2021-01-08: 0.25 mg via INTRAVENOUS
  Filled 2021-01-08: qty 5

## 2021-01-08 MED ORDER — DEXTROSE 5 % IV SOLN
28.6000 mg/m2 | Freq: Once | INTRAVENOUS | Status: AC
  Administered 2021-01-08: 60 mg via INTRAVENOUS
  Filled 2021-01-08: qty 30

## 2021-01-08 MED ORDER — HEPARIN SOD (PORK) LOCK FLUSH 100 UNIT/ML IV SOLN
500.0000 [IU] | Freq: Once | INTRAVENOUS | Status: AC | PRN
  Administered 2021-01-08: 500 [IU]

## 2021-01-08 MED ORDER — SODIUM CHLORIDE 0.9 % IV SOLN
60.0000 mg | Freq: Once | INTRAVENOUS | Status: AC
  Administered 2021-01-08: 60 mg via INTRAVENOUS
  Filled 2021-01-08: qty 10

## 2021-01-08 MED ORDER — SODIUM CHLORIDE 0.9 % IV SOLN
300.0000 mg/m2 | Freq: Once | INTRAVENOUS | Status: AC
  Administered 2021-01-08: 620 mg via INTRAVENOUS
  Filled 2021-01-08: qty 31

## 2021-01-08 MED ORDER — TESTOSTERONE CYPIONATE 200 MG/ML IM SOLN
400.0000 mg | Freq: Once | INTRAMUSCULAR | Status: AC
  Administered 2021-01-08: 400 mg via INTRAMUSCULAR
  Filled 2021-01-08: qty 2

## 2021-01-08 MED ORDER — SODIUM CHLORIDE 0.9 % IV SOLN
Freq: Once | INTRAVENOUS | Status: AC
Start: 2021-01-08 — End: 2021-01-08

## 2021-01-08 MED ORDER — SODIUM CHLORIDE 0.9% FLUSH
10.0000 mL | INTRAVENOUS | Status: DC | PRN
  Administered 2021-01-08: 10 mL

## 2021-01-08 NOTE — Telephone Encounter (Signed)
As noted below by Dr. [name], I informed the patient that he needs to get a Vitamin B12 injection. Patient verbalized understanding and is coming tomorrow morning.

## 2021-01-08 NOTE — Progress Notes (Signed)
Hematology and Oncology Follow Up Visit  [name] [id number] 1951-02-07 70 y.o. 01/08/2021   Principle Diagnosis:  IgG kappa myeloma Anemia secondary to renal insufficiency Intermittent iron - deficiency anemia Hypotestosteronemia   Current Therapy:        Aredia 60 mg IV q  month -- Retacrit SQ as needed for hemoglobin less than 10 DepoTestosterone 400 mg q 4 weeks IV iron as indicated Kyprolis/Cytoxan - s/p cycle    Interim History:  [name] is here today for follow-up and treatment. He is doing fairly well. The cold weather has made his arthritis flare up.  Last month no M-spike was detected. IgG level was 1,004 mg/dL and kappa light chains 3.41 mg/dL.  No fever, chills, n/v, cough, rash, dizziness, chest pain, abdominal pain or changes in bowel or bladder habits.  He has occasional SOB and palpitations with over exertion. He rests when needed.  He has occasional nausea with certain foods and smells. He notes that he is eating more and hydrating better throughout the day.  His weight is stable at 190 lbs.  Neuropathy in his hands and feet is unchanged. No falls or syncope.   ECOG Performance Status: 1 - Symptomatic but completely ambulatory  Medications:  Allergies as of 01/08/2021       Reactions   Iodinated Diagnostic Agents Rash   Patient states he was instructed not to take IV contrast.  In 2008 he had an unknown reaction, and was told not to take it again.  He was also told not to take it due to his kidneys.   Iodine Anxiety, Rash, Other (See Comments)   Didn't feel right "instructed not to take per MD--something with his port"        Medication List        Accurate as of January 08, 2021  8:22 AM. If you have any questions, ask your nurse or doctor.          amLODipine 10 MG tablet Commonly known as: NORVASC Take 10 mg by mouth daily.   atorvastatin 40 MG tablet Commonly known as: LIPITOR Take 40 mg by mouth daily at 6 PM.   Carafate 1  GM/10ML suspension Generic drug: sucralfate Take 1 g by mouth 4 (four) times daily.   carvedilol 25 MG tablet Commonly known as: COREG Take 25 mg by mouth 2 (two) times daily.   [employer] Fiber Gummies 2 g Chew Chew by mouth daily.   dicyclomine 10 MG capsule Commonly known as: BENTYL TAKE 1 CAPSULE (10 MG TOTAL) BY MOUTH 3 (THREE) TIMES DAILY BEFORE MEALS.   dronabinol 2.5 MG capsule Commonly known as: MARINOL Take 2.5 mg by mouth 2 (two) times daily before lunch and supper.   gabapentin 300 MG capsule Commonly known as: NEURONTIN TAKE 1 CAPSULE BY MOUTH THREE TIMES A DAY What changed:  how much to take how to take this when to take this additional instructions   glimepiride 2 MG tablet Commonly known as: AMARYL Take 2 mg by mouth daily.   hydrocortisone 2.5 % cream Apply topically.   lactulose 10 GM/15ML solution Commonly known as: CHRONULAC Take 30 mLs (20 g total) by mouth daily.   latanoprost 0.005 % ophthalmic solution Commonly known as: XALATAN 1 drop at bedtime.   linaclotide 145 MCG Caps capsule Commonly known as: LINZESS Take by mouth daily as needed.   losartan 50 MG tablet Commonly known as: COZAAR Take 50 mg by mouth daily.   Lumigan 0.01 % Soln  Generic drug: bimatoprost Place 1 drop into both eyes 3 (three) times daily.   metFORMIN 500 MG tablet Commonly known as: GLUCOPHAGE Take 500 mg by mouth 2 (two) times daily.   metoprolol succinate 25 MG 24 hr tablet Commonly known as: TOPROL-XL Take 25 mg by mouth daily.   mirabegron ER 50 MG Tb24 tablet Commonly known as: MYRBETRIQ Take 50 mg by mouth daily.   morphine 30 MG 12 hr tablet Commonly known as: MS CONTIN Take 30 mg by mouth 2 (two) times daily.   omeprazole 40 MG capsule Commonly known as: PRILOSEC TAKE 1 CAPSULE BY MOUTH TWICE A DAY   ondansetron 8 MG tablet Commonly known as: ZOFRAN TAKE 1 TABLET(8 MG) BY MOUTH TWICE DAILY AS NEEDED FOR NAUSEA OR VOMITING   oxyCODONE 5 MG  immediate release tablet Commonly known as: Roxicodone 1 tab PO q6 hours prn pain   oxyCODONE-acetaminophen 10-325 MG tablet Commonly known as: PERCOCET Take 1 tablet by mouth every 6 (six) hours as needed for pain.   Pfizer COVID-19 Vac Bivalent injection Generic drug: COVID-19 mRNA bivalent vaccine Therapist, music) Inject into the muscle.   pioglitazone 30 MG tablet Commonly known as: ACTOS Take 30 mg by mouth daily.   prochlorperazine 10 MG tablet Commonly known as: COMPAZINE take 1 tablet by mouth every 6 hours if needed for nausea and vomiting   Restasis 0.05 % ophthalmic emulsion Generic drug: cycloSPORINE Place 1 drop into both eyes daily.   SUMAtriptan 100 MG tablet Commonly known as: IMITREX Take 100 mg by mouth daily.   tadalafil 10 MG tablet Commonly known as: CIALIS Take by mouth.   topiramate 25 MG capsule Commonly known as: TOPAMAX Take 25 mg by mouth 2 (two) times daily.   torsemide 20 MG tablet Commonly known as: DEMADEX Take 40 mg by mouth daily.   Trulicity 5.00 XF/8.1WE Sopn Generic drug: Dulaglutide Inject 0.75 mg into the skin every Saturday.   Vitamin D (Ergocalciferol) 1.25 MG (50000 UNIT) Caps capsule Commonly known as: DRISDOL Take 50,000 Units by mouth every Saturday.        Allergies:  Allergies  Allergen Reactions   Iodinated Diagnostic Agents Rash    Patient states he was instructed not to take IV contrast.  In 2008 he had an unknown reaction, and was told not to take it again.  He was also told not to take it due to his kidneys.   Iodine Anxiety, Rash and Other (See Comments)    Didn't feel right "instructed not to take per MD--something with his port"     Past Medical History, Surgical history, Social history, and Family History were reviewed and updated.  Review of Systems: All other 10 point review of systems is negative.   Physical Exam:  height is 5\' 11"  (1.803 m) and weight is 190 lb (86.2 kg). His oral temperature is  98.2 F (36.8 C). His blood pressure is 123/80 and his pulse is 88. His respiration is 17 and oxygen saturation is 97%.   Wt Readings from Last 3 Encounters:  01/08/21 190 lb (86.2 kg)  12/10/20 183 lb 3 oz (83.1 kg)  11/12/20 186 lb 12 oz (84.7 kg)    Ocular: Sclerae unicteric, pupils equal, round and reactive to light Ear-nose-throat: Oropharynx clear, dentition fair Lymphatic: No cervical or supraclavicular adenopathy Lungs no rales or rhonchi, good excursion bilaterally Heart regular rate and rhythm, no murmur appreciated Abd soft, nontender, positive bowel sounds MSK no focal spinal tenderness, no joint edema  Neuro: non-focal, well-oriented, appropriate affect Breasts: Deferred   Lab Results  Component Value Date   WBC 5.2 01/08/2021   HGB 10.5 (L) 01/08/2021   HCT 32.8 (L) 01/08/2021   MCV 81.6 01/08/2021   PLT 157 01/08/2021   Lab Results  Component Value Date   FERRITIN 2,136 (H) 12/10/2020   IRON 116 12/10/2020   TIBC 231 12/10/2020   UIBC 115 (L) 12/10/2020   IRONPCTSAT 50 12/10/2020   Lab Results  Component Value Date   RETICCTPCT 0.7 12/10/2020   RBC 4.02 (L) 01/08/2021   RETICCTABS 27.5 03/21/2014   Lab Results  Component Value Date   KPAFRELGTCHN 34.1 (H) 12/10/2020   LAMBDASER 18.5 12/10/2020   KAPLAMBRATIO 1.84 (H) 12/10/2020   Lab Results  Component Value Date   IGGSERUM 1,004 12/10/2020   IGA 249 12/10/2020   IGMSERUM 32 12/10/2020   Lab Results  Component Value Date   TOTALPROTELP 6.5 12/10/2020   ALBUMINELP 4.0 12/10/2020   A1GS 0.2 12/10/2020   A2GS 0.7 12/10/2020   BETS 0.9 12/10/2020   BETA2SER 0.5 11/07/2014   GAMS 0.8 12/10/2020   MSPIKE Not Observed 12/10/2020   SPEI Comment 10/14/2020     Chemistry      Component Value Date/Time   NA 139 12/10/2020 0753   NA 143 02/22/2017 0744   K 4.3 12/10/2020 0753   K 4.0 02/22/2017 0744   CL 101 12/10/2020 0753   CL 106 02/22/2017 0744   CO2 29 12/10/2020 0753   CO2 27  02/22/2017 0744   BUN 30 (H) 12/10/2020 0753   BUN 26 (H) 02/22/2017 0744   CREATININE 2.17 (H) 12/10/2020 0753   CREATININE 2.3 (H) 02/22/2017 0744      Component Value Date/Time   CALCIUM 9.3 12/10/2020 0753   CALCIUM 9.3 02/22/2017 0744   ALKPHOS 57 12/10/2020 0753   ALKPHOS 80 02/22/2017 0744   AST 17 12/10/2020 0753   ALT 21 12/10/2020 0753   ALT 22 02/22/2017 0744   BILITOT 0.6 12/10/2020 0753       Impression and Plan: Mr. Blue is a very pleasant 70 yo African American gentleman with IgG kappa myeloma.  He continues to have a nice response to Kyprolis and Cytoxan.  We will proceed with treatment today as planned.  Follow-up in 1 month.  He can contact our office with any questions or concerns.   Lottie Dawson, NP 11/16/20228:22 AM

## 2021-01-08 NOTE — Patient Instructions (Signed)

## 2021-01-08 NOTE — Telephone Encounter (Signed)
-----   Message from Volanda Napoleon, MD sent at 01/08/2021  2:31 PM EST ----- The B12 is very low for some reason.  He needs IM B12 now!!  Please set this up.

## 2021-01-08 NOTE — Patient Instructions (Signed)
[facility]  Discharge Instructions: Thank you for choosing [facility] to provide your oncology and hematology care.   If you have a lab appointment with the [facility], please go directly to the [facility] and check in at the registration area.  Wear comfortable clothing and clothing appropriate for easy access to any Portacath or PICC line.   We strive to give you quality time with your provider. You may need to reschedule your appointment if you arrive late (15 or more minutes).  Arriving late affects you and other patients whose appointments are after yours.  Also, if you miss three or more appointments without notifying the office, you may be dismissed from the clinic at the provider's discretion.      For prescription refill requests, have your pharmacy contact our office and allow 72 hours for refills to be completed.    Today you received the following chemotherapy and/or immunotherapy agents Kyprois, Aredia      To help prevent nausea and vomiting after your treatment, we encourage you to take your nausea medication as directed.  BELOW ARE SYMPTOMS THAT SHOULD BE REPORTED IMMEDIATELY: *FEVER GREATER THAN 100.4 F (38 C) OR HIGHER *CHILLS OR SWEATING *NAUSEA AND VOMITING THAT IS NOT CONTROLLED WITH YOUR NAUSEA MEDICATION *UNUSUAL SHORTNESS OF BREATH *UNUSUAL BRUISING OR BLEEDING *URINARY PROBLEMS (pain or burning when urinating, or frequent urination) *BOWEL PROBLEMS (unusual diarrhea, constipation, pain near the anus) TENDERNESS IN MOUTH AND THROAT WITH OR WITHOUT PRESENCE OF ULCERS (sore throat, sores in mouth, or a toothache) UNUSUAL RASH, SWELLING OR PAIN  UNUSUAL VAGINAL DISCHARGE OR ITCHING   Items with * indicate a potential emergency and should be followed up as soon as possible or go to the Emergency Department if any problems should occur.  Please show the CHEMOTHERAPY ALERT CARD or IMMUNOTHERAPY ALERT CARD at check-in to  the Emergency Department and triage nurse. Should you have questions after your visit or need to cancel or reschedule your appointment, please contact [facility]  [phone] and follow the prompts.  Office hours are 8:00 a.m. to 4:30 p.m. Monday - Friday. Please note that voicemails left after 4:00 p.m. may not be returned until the following business day.  We are closed weekends and major holidays. You have access to a nurse at all times for urgent questions. Please call the main number to the clinic [phone] and follow the prompts.  For any non-urgent questions, you may also contact your provider using MyChart. We now offer e-Visits for anyone 20 and older to request care online for non-urgent symptoms. For details visit [email or web address].   Also download the MyChart app! Go to the app store, search "MyChart", open the app, select [facility], and log in with your MyChart username and password.  Due to Covid, a mask is required upon entering the hospital/clinic. If you do not have a mask, one will be given to you upon arrival. For doctor visits, patients may have 1 support person aged 26 or older with them. For treatment visits, patients cannot have anyone with them due to current Covid guidelines and our immunocompromised population.

## 2021-01-08 NOTE — Progress Notes (Signed)
Ok to treat with creatinine of 2.66 per Judson Roch, NP. dph

## 2021-01-09 ENCOUNTER — Inpatient Hospital Stay: Payer: 59

## 2021-01-09 VITALS — BP 135/70 | HR 85 | Temp 98.6°F | Resp 18

## 2021-01-09 DIAGNOSIS — D519 Vitamin B12 deficiency anemia, unspecified: Secondary | ICD-10-CM

## 2021-01-09 DIAGNOSIS — Z5112 Encounter for antineoplastic immunotherapy: Secondary | ICD-10-CM | POA: Diagnosis not present

## 2021-01-09 LAB — KAPPA/LAMBDA LIGHT CHAINS
Kappa free light chain: 54.4 mg/L — ABNORMAL HIGH (ref 3.3–19.4)
Kappa, lambda light chain ratio: 2.44 — ABNORMAL HIGH (ref 0.26–1.65)
Lambda free light chains: 22.3 mg/L (ref 5.7–26.3)

## 2021-01-09 LAB — IGG, IGA, IGM
IgA: 242 mg/dL (ref 61–437)
IgG (Immunoglobin G), Serum: 974 mg/dL (ref 603–1613)
IgM (Immunoglobulin M), Srm: 26 mg/dL (ref 15–143)

## 2021-01-09 MED ORDER — CYANOCOBALAMIN 1000 MCG/ML IJ SOLN
1000.0000 ug | Freq: Once | INTRAMUSCULAR | Status: AC
  Administered 2021-01-09: 08:00:00 1000 ug via INTRAMUSCULAR
  Filled 2021-01-09: qty 1

## 2021-01-09 NOTE — Patient Instructions (Signed)

## 2021-01-09 NOTE — Progress Notes (Signed)
B12 level is low. Patient to have cyanocobalamin inj at every visit per Dr. [name]. Orders entered per his instructions.

## 2021-01-13 LAB — PROTEIN ELECTROPHORESIS, SERUM, WITH REFLEX
A/G Ratio: 1.2 (ref 0.7–1.7)
Albumin ELP: 3.7 g/dL (ref 2.9–4.4)
Alpha-1-Globulin: 0.2 g/dL (ref 0.0–0.4)
Alpha-2-Globulin: 0.9 g/dL (ref 0.4–1.0)
Beta Globulin: 1 g/dL (ref 0.7–1.3)
Gamma Globulin: 0.9 g/dL (ref 0.4–1.8)
Globulin, Total: 3 g/dL (ref 2.2–3.9)
Total Protein ELP: 6.7 g/dL (ref 6.0–8.5)

## 2021-01-20 ENCOUNTER — Other Ambulatory Visit: Payer: Self-pay

## 2021-01-20 ENCOUNTER — Encounter: Payer: Self-pay | Admitting: Podiatry

## 2021-01-20 ENCOUNTER — Ambulatory Visit (INDEPENDENT_AMBULATORY_CARE_PROVIDER_SITE_OTHER): Payer: 59 | Admitting: Podiatry

## 2021-01-20 DIAGNOSIS — E1151 Type 2 diabetes mellitus with diabetic peripheral angiopathy without gangrene: Secondary | ICD-10-CM

## 2021-01-20 DIAGNOSIS — M79674 Pain in right toe(s): Secondary | ICD-10-CM

## 2021-01-20 DIAGNOSIS — L84 Corns and callosities: Secondary | ICD-10-CM

## 2021-01-20 DIAGNOSIS — B351 Tinea unguium: Secondary | ICD-10-CM | POA: Diagnosis not present

## 2021-01-20 DIAGNOSIS — M2010 Hallux valgus (acquired), unspecified foot: Secondary | ICD-10-CM | POA: Insufficient documentation

## 2021-01-20 DIAGNOSIS — M79675 Pain in left toe(s): Secondary | ICD-10-CM

## 2021-01-20 NOTE — Progress Notes (Addendum)
This patient returns to my office for at risk foot care.  This patient requires this care by a professional since this patient will be at risk due to having diabetes type 2.  This patient is unable to cut nails himself since the patient cannot reach his nails.These nails are painful walking and wearing shoes.  Painful callus under outside ball of both feet.This patient presents for at risk foot care today.  General Appearance  Alert, conversant and in no acute stress.  Vascular  Dorsalis pedis and posterior tibial  pulses are weakly  palpable  bilaterally.  Capillary return is within normal limits  bilaterally. Cold feet bilaterally.  Absent digital hair.  Neurologic  Senn-Weinstein monofilament wire test within normal limits/diminished right.  LOPS absent left foot. Muscle power within normal limits bilaterally.  Nails Thick disfigured discolored nails with subungual debris  from hallux to fifth toes bilaterally. No evidence of bacterial infection or drainage bilaterally.  Orthopedic  No limitations of motion  feet .  No crepitus or effusions noted.  No bony pathology or digital deformities noted.  HAV  B/L.  Exostosis right foot.Plantar flexed fifth metatarsall B/l.  Skin  normotropic skin with no porokeratosis noted bilaterally.  No signs of infections or ulcers noted.  Callus sub 5th met  B/l. No infection.  Onychomycosis  Pain in right toes  Pain in left toes  Porokeratosis  B/L  Consent was obtained for treatment procedures.   Mechanical debridement of nails 1-5  bilaterally performed with a nail nipper.  Filed with dremel without incident. Debride callus sub 5th  B/L  patient qualifies for diabetic shoes due to DPN and HAV  B/L.  Patient will get shoes in 2023.   Return office visit    10 weeks                  Told patient to return for periodic foot care and evaluation due to potential at risk complications.   Gardiner Barefoot DPM

## 2021-02-07 ENCOUNTER — Encounter: Payer: Self-pay | Admitting: Family

## 2021-02-07 ENCOUNTER — Other Ambulatory Visit: Payer: Self-pay

## 2021-02-07 ENCOUNTER — Inpatient Hospital Stay: Payer: 59

## 2021-02-07 ENCOUNTER — Inpatient Hospital Stay: Payer: 59 | Attending: Hematology & Oncology

## 2021-02-07 ENCOUNTER — Inpatient Hospital Stay (HOSPITAL_BASED_OUTPATIENT_CLINIC_OR_DEPARTMENT_OTHER): Payer: 59 | Admitting: Family

## 2021-02-07 VITALS — BP 143/61 | HR 71 | Temp 97.8°F | Resp 18 | Ht 71.0 in | Wt 193.0 lb

## 2021-02-07 DIAGNOSIS — D631 Anemia in chronic kidney disease: Secondary | ICD-10-CM

## 2021-02-07 DIAGNOSIS — C9000 Multiple myeloma not having achieved remission: Secondary | ICD-10-CM | POA: Diagnosis not present

## 2021-02-07 DIAGNOSIS — E349 Endocrine disorder, unspecified: Secondary | ICD-10-CM | POA: Diagnosis not present

## 2021-02-07 DIAGNOSIS — D509 Iron deficiency anemia, unspecified: Secondary | ICD-10-CM | POA: Insufficient documentation

## 2021-02-07 DIAGNOSIS — G629 Polyneuropathy, unspecified: Secondary | ICD-10-CM | POA: Diagnosis not present

## 2021-02-07 DIAGNOSIS — Z5112 Encounter for antineoplastic immunotherapy: Secondary | ICD-10-CM | POA: Insufficient documentation

## 2021-02-07 DIAGNOSIS — N189 Chronic kidney disease, unspecified: Secondary | ICD-10-CM

## 2021-02-07 DIAGNOSIS — Z5111 Encounter for antineoplastic chemotherapy: Secondary | ICD-10-CM | POA: Diagnosis present

## 2021-02-07 DIAGNOSIS — D519 Vitamin B12 deficiency anemia, unspecified: Secondary | ICD-10-CM

## 2021-02-07 DIAGNOSIS — E291 Testicular hypofunction: Secondary | ICD-10-CM | POA: Insufficient documentation

## 2021-02-07 DIAGNOSIS — Z299 Encounter for prophylactic measures, unspecified: Secondary | ICD-10-CM

## 2021-02-07 DIAGNOSIS — M4712 Other spondylosis with myelopathy, cervical region: Secondary | ICD-10-CM

## 2021-02-07 DIAGNOSIS — D500 Iron deficiency anemia secondary to blood loss (chronic): Secondary | ICD-10-CM

## 2021-02-07 LAB — CBC WITH DIFFERENTIAL (CANCER CENTER ONLY)
Abs Immature Granulocytes: 0.04 10*3/uL (ref 0.00–0.07)
Basophils Absolute: 0.1 10*3/uL (ref 0.0–0.1)
Basophils Relative: 1 %
Eosinophils Absolute: 0.2 10*3/uL (ref 0.0–0.5)
Eosinophils Relative: 3 %
HCT: 33.3 % — ABNORMAL LOW (ref 39.0–52.0)
Hemoglobin: 10.6 g/dL — ABNORMAL LOW (ref 13.0–17.0)
Immature Granulocytes: 1 %
Lymphocytes Relative: 16 %
Lymphs Abs: 0.9 10*3/uL (ref 0.7–4.0)
MCH: 25.7 pg — ABNORMAL LOW (ref 26.0–34.0)
MCHC: 31.8 g/dL (ref 30.0–36.0)
MCV: 80.6 fL (ref 80.0–100.0)
Monocytes Absolute: 0.7 10*3/uL (ref 0.1–1.0)
Monocytes Relative: 12 %
Neutro Abs: 3.7 10*3/uL (ref 1.7–7.7)
Neutrophils Relative %: 67 %
Platelet Count: 180 10*3/uL (ref 150–400)
RBC: 4.13 MIL/uL — ABNORMAL LOW (ref 4.22–5.81)
RDW: 15.5 % (ref 11.5–15.5)
WBC Count: 5.5 10*3/uL (ref 4.0–10.5)
nRBC: 0 % (ref 0.0–0.2)

## 2021-02-07 LAB — CMP (CANCER CENTER ONLY)
ALT: 20 U/L (ref 0–44)
AST: 23 U/L (ref 15–41)
Albumin: 4.1 g/dL (ref 3.5–5.0)
Alkaline Phosphatase: 87 U/L (ref 38–126)
Anion gap: 8 (ref 5–15)
BUN: 26 mg/dL — ABNORMAL HIGH (ref 8–23)
CO2: 31 mmol/L (ref 22–32)
Calcium: 9.2 mg/dL (ref 8.9–10.3)
Chloride: 102 mmol/L (ref 98–111)
Creatinine: 2.7 mg/dL — ABNORMAL HIGH (ref 0.61–1.24)
GFR, Estimated: 24 mL/min — ABNORMAL LOW (ref >60)
Glucose, Bld: 95 mg/dL (ref 70–99)
Potassium: 3.9 mmol/L (ref 3.5–5.1)
Sodium: 141 mmol/L (ref 135–145)
Total Bilirubin: 0.4 mg/dL (ref 0.3–1.2)
Total Protein: 7.1 g/dL (ref 6.5–8.1)

## 2021-02-07 LAB — IRON AND TIBC
Iron: 80 ug/dL (ref 42–163)
Saturation Ratios: 33 % (ref 20–55)
TIBC: 241 ug/dL (ref 202–409)
UIBC: 161 ug/dL (ref 117–376)

## 2021-02-07 LAB — FERRITIN: Ferritin: 2058 ng/mL — ABNORMAL HIGH (ref 24–336)

## 2021-02-07 LAB — LACTATE DEHYDROGENASE: LDH: 236 U/L — ABNORMAL HIGH (ref 98–192)

## 2021-02-07 MED ORDER — SODIUM CHLORIDE 0.9 % IV SOLN
300.0000 mg/m2 | Freq: Once | INTRAVENOUS | Status: AC
  Administered 2021-02-07: 620 mg via INTRAVENOUS
  Filled 2021-02-07: qty 31

## 2021-02-07 MED ORDER — SODIUM CHLORIDE 0.9 % IV SOLN: Freq: Once | INTRAVENOUS | Status: AC

## 2021-02-07 MED ORDER — SODIUM CHLORIDE 0.9 % IV SOLN: 300.0000 mg | Freq: Once | INTRAVENOUS | Status: DC

## 2021-02-07 MED ORDER — SODIUM CHLORIDE 0.9% FLUSH
10.0000 mL | INTRAVENOUS | Status: DC | PRN
  Administered 2021-02-07: 10 mL

## 2021-02-07 MED ORDER — CYANOCOBALAMIN 1000 MCG/ML IJ SOLN
1000.0000 ug | Freq: Once | INTRAMUSCULAR | Status: AC
  Administered 2021-02-07: 1000 ug via INTRAMUSCULAR
  Filled 2021-02-07: qty 1

## 2021-02-07 MED ORDER — SODIUM CHLORIDE 0.9 % IV SOLN
60.0000 mg | Freq: Once | INTRAVENOUS | Status: AC
  Administered 2021-02-07: 60 mg via INTRAVENOUS
  Filled 2021-02-07: qty 20

## 2021-02-07 MED ORDER — PALONOSETRON HCL INJECTION 0.25 MG/5ML
0.2500 mg | Freq: Once | INTRAVENOUS | Status: AC
  Administered 2021-02-07: 0.25 mg via INTRAVENOUS
  Filled 2021-02-07: qty 5

## 2021-02-07 MED ORDER — HYDROMORPHONE HCL 1 MG/ML IJ SOLN
2.0000 mg | Freq: Once | INTRAMUSCULAR | Status: AC
  Administered 2021-02-07: 2 mg via INTRAVENOUS
  Filled 2021-02-07: qty 2

## 2021-02-07 MED ORDER — DEXTROSE 5 % IV SOLN
29.0000 mg/m2 | Freq: Once | INTRAVENOUS | Status: AC
  Administered 2021-02-07: 60 mg via INTRAVENOUS
  Filled 2021-02-07: qty 30

## 2021-02-07 MED ORDER — TESTOSTERONE CYPIONATE 200 MG/ML IM SOLN
400.0000 mg | Freq: Once | INTRAMUSCULAR | Status: AC
  Administered 2021-02-07: 400 mg via INTRAMUSCULAR
  Filled 2021-02-07: qty 2

## 2021-02-07 MED ORDER — HEPARIN SOD (PORK) LOCK FLUSH 100 UNIT/ML IV SOLN
500.0000 [IU] | Freq: Once | INTRAVENOUS | Status: AC | PRN
  Administered 2021-02-07: 500 [IU]

## 2021-02-07 MED ORDER — SODIUM CHLORIDE 0.9 % IV SOLN
10.0000 mg | Freq: Once | INTRAVENOUS | Status: AC
  Administered 2021-02-07: 10 mg via INTRAVENOUS
  Filled 2021-02-07: qty 10

## 2021-02-07 NOTE — Progress Notes (Signed)
Hematology and Oncology Follow Up Visit  [name] [id number] 12-20-1950 70 y.o. 02/07/2021   Principle Diagnosis:  IgG kappa myeloma Anemia secondary to renal insufficiency Intermittent iron - deficiency anemia Hypotestosteronemia   Current Therapy:        Aredia 60 mg IV q  month -- Retacrit SQ as needed for hemoglobin less than 10 DepoTestosterone 400 mg q 4 weeks IV iron as indicated Kyprolis/Cytoxan - s/p cycle 30   Interim History:  [name] is here today for follow-up. He is having increased generalized aches and pains secondary to arthritis.  He is resting when he can.  He notes that he feels cold all the time. Hgb is 10.6, MCV 80 and platelets 180. Iron studies are pending.  November no M-spike was not detected, IgG level was 974 mg/dL and kappa light chains 5.44 mg/dL.  No fever, n/v, cough, rash, dizziness, SOB, chest pain, palpitations, abdominal pain or changes in bowel or bladder habits.  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  No falls or syncope to report.  His weight is stable at 193 lbs. His appetite comes and goes. He has some nausea without vomiting. He does feel that he is doing his best to stay well hydrated.  ECOG Performance Status: 1 - Symptomatic but completely ambulatory  Medications:  Allergies as of 02/07/2021       Reactions   Iodinated Diagnostic Agents Rash   Patient states he was instructed not to take IV contrast.  In 2008 he had an unknown reaction, and was told not to take it again.  He was also told not to take it due to his kidneys.   Iodine Anxiety, Rash, Other (See Comments)   Didn't feel right "instructed not to take per MD--something with his port"        Medication List        Accurate as of February 07, 2021  8:42 AM. If you have any questions, ask your nurse or doctor.          amLODipine 10 MG tablet Commonly known as: NORVASC Take 10 mg by mouth daily.   atorvastatin 40  MG tablet Commonly known as: LIPITOR Take 40 mg by mouth daily at 6 PM.   Carafate 1 GM/10ML suspension Generic drug: sucralfate Take 1 g by mouth 4 (four) times daily.   carvedilol 25 MG tablet Commonly known as: COREG Take 25 mg by mouth 2 (two) times daily.   [employer]S Fiber Gummies 2 g Chew Chew by mouth daily.   dicyclomine 10 MG capsule Commonly known as: BENTYL TAKE 1 CAPSULE (10 MG TOTAL) BY MOUTH 3 (THREE) TIMES DAILY BEFORE MEALS.   dronabinol 2.5 MG capsule Commonly known as: MARINOL Take 2.5 mg by mouth 2 (two) times daily before lunch and supper.   gabapentin 300 MG capsule Commonly known as: NEURONTIN TAKE 1 CAPSULE BY MOUTH THREE TIMES A DAY What changed:  how much to take how to take this when to take this additional instructions   glimepiride 2 MG tablet Commonly known as: AMARYL Take 2 mg by mouth daily.   hydrocortisone 2.5 % cream Apply topically.   lactulose 10 GM/15ML solution Commonly known as: CHRONULAC Take 30 mLs (20 g total) by mouth daily.   latanoprost 0.005 % ophthalmic solution Commonly known as: XALATAN 1 drop at bedtime.   linaclotide 145 MCG Caps capsule Commonly known as: LINZESS Take by mouth daily as needed.   losartan 50 MG  tablet Commonly known as: COZAAR Take 50 mg by mouth daily.   Lumigan 0.01 % Soln Generic drug: bimatoprost Place 1 drop into both eyes 3 (three) times daily.   metFORMIN 500 MG tablet Commonly known as: GLUCOPHAGE Take 500 mg by mouth 2 (two) times daily.   metoprolol succinate 25 MG 24 hr tablet Commonly known as: TOPROL-XL Take 25 mg by mouth daily.   mirabegron ER 50 MG Tb24 tablet Commonly known as: MYRBETRIQ Take 50 mg by mouth daily.   morphine 30 MG 12 hr tablet Commonly known as: MS CONTIN Take 30 mg by mouth 2 (two) times daily.   omeprazole 40 MG capsule Commonly known as: PRILOSEC TAKE 1 CAPSULE BY MOUTH TWICE A DAY   ondansetron 8 MG tablet Commonly known as: ZOFRAN TAKE 1  TABLET(8 MG) BY MOUTH TWICE DAILY AS NEEDED FOR NAUSEA OR VOMITING   oxyCODONE 5 MG immediate release tablet Commonly known as: Roxicodone 1 tab PO q6 hours prn pain   oxyCODONE-acetaminophen 10-325 MG tablet Commonly known as: PERCOCET Take 1 tablet by mouth every 6 (six) hours as needed for pain.   Pfizer COVID-19 Vac Bivalent injection Generic drug: COVID-19 mRNA bivalent vaccine Therapist, music) Inject into the muscle.   pioglitazone 30 MG tablet Commonly known as: ACTOS Take 30 mg by mouth daily.   prochlorperazine 10 MG tablet Commonly known as: COMPAZINE take 1 tablet by mouth every 6 hours if needed for nausea and vomiting   Restasis 0.05 % ophthalmic emulsion Generic drug: cycloSPORINE Place 1 drop into both eyes daily.   SUMAtriptan 100 MG tablet Commonly known as: IMITREX Take 100 mg by mouth daily.   tadalafil 10 MG tablet Commonly known as: CIALIS Take by mouth.   topiramate 25 MG capsule Commonly known as: TOPAMAX Take 25 mg by mouth 2 (two) times daily.   torsemide 20 MG tablet Commonly known as: DEMADEX Take 40 mg by mouth daily.   Trulicity 0.24 OX/7.3ZH Sopn Generic drug: Dulaglutide Inject 0.75 mg into the skin every Saturday.   Vitamin D (Ergocalciferol) 1.25 MG (50000 UNIT) Caps capsule Commonly known as: DRISDOL Take 50,000 Units by mouth every Saturday.        Allergies:  Allergies  Allergen Reactions   Iodinated Diagnostic Agents Rash    Patient states he was instructed not to take IV contrast.  In 2008 he had an unknown reaction, and was told not to take it again.  He was also told not to take it due to his kidneys.   Iodine Anxiety, Rash and Other (See Comments)    Didn't feel right "instructed not to take per MD--something with his port"     Past Medical History, Surgical history, Social history, and Family History were reviewed and updated.  Review of Systems: All other 10 point review of systems is negative.   Physical Exam:   height is 5\' 11"  (1.803 m) and weight is 193 lb (87.5 kg). His oral temperature is 97.8 F (36.6 C). His blood pressure is 143/61 (abnormal) and his pulse is 71. His respiration is 18 and oxygen saturation is 98%.   Wt Readings from Last 3 Encounters:  02/07/21 193 lb (87.5 kg)  01/08/21 190 lb (86.2 kg)  12/10/20 183 lb 3 oz (83.1 kg)    Ocular: Sclerae unicteric, pupils equal, round and reactive to light Ear-nose-throat: Oropharynx clear, dentition fair Lymphatic: No cervical or supraclavicular adenopathy Lungs no rales or rhonchi, good excursion bilaterally Heart regular rate and rhythm, no  murmur appreciated Abd soft, nontender, positive bowel sounds MSK no focal spinal tenderness, no joint edema Neuro: non-focal, well-oriented, appropriate affect Breasts: Deferred   Lab Results  Component Value Date   WBC 5.2 01/08/2021   HGB 10.5 (L) 01/08/2021   HCT 32.8 (L) 01/08/2021   MCV 81.6 01/08/2021   PLT 157 01/08/2021   Lab Results  Component Value Date   FERRITIN 2,342 (H) 01/08/2021   IRON 103 01/08/2021   TIBC 234 01/08/2021   UIBC 131 01/08/2021   IRONPCTSAT 44 01/08/2021   Lab Results  Component Value Date   RETICCTPCT 0.7 12/10/2020   RBC 4.02 (L) 01/08/2021   RETICCTABS 27.5 03/21/2014   Lab Results  Component Value Date   KPAFRELGTCHN 54.4 (H) 01/08/2021   LAMBDASER 22.3 01/08/2021   KAPLAMBRATIO 2.44 (H) 01/08/2021   Lab Results  Component Value Date   IGGSERUM 974 01/08/2021   IGA 242 01/08/2021   IGMSERUM 26 01/08/2021   Lab Results  Component Value Date   TOTALPROTELP 6.7 01/08/2021   ALBUMINELP 3.7 01/08/2021   A1GS 0.2 01/08/2021   A2GS 0.9 01/08/2021   BETS 1.0 01/08/2021   BETA2SER 0.5 11/07/2014   GAMS 0.9 01/08/2021   MSPIKE Not Observed 01/08/2021   SPEI Comment 10/14/2020     Chemistry      Component Value Date/Time   NA 140 01/08/2021 0800   NA 143 02/22/2017 0744   K 4.2 01/08/2021 0800   K 4.0 02/22/2017 0744   CL 102  01/08/2021 0800   CL 106 02/22/2017 0744   CO2 31 01/08/2021 0800   CO2 27 02/22/2017 0744   BUN 28 (H) 01/08/2021 0800   BUN 26 (H) 02/22/2017 0744   CREATININE 2.66 (H) 01/08/2021 0800   CREATININE 2.3 (H) 02/22/2017 0744      Component Value Date/Time   CALCIUM 9.0 01/08/2021 0800   CALCIUM 9.3 02/22/2017 0744   ALKPHOS 73 01/08/2021 0800   ALKPHOS 80 02/22/2017 0744   AST 23 01/08/2021 0800   ALT 18 01/08/2021 0800   ALT 22 02/22/2017 0744   BILITOT 0.4 01/08/2021 0800       Impression and Plan: Mr. Harshfield is a very pleasant 70 yo African American gentleman with IgG kappa myeloma. He is tolerating treatment well and has responded nicely so far. We will proceed with treatment today as planned.  Follow-up in 1 month.   Lottie Dawson, NP 12/16/20228:42 AM

## 2021-02-07 NOTE — Patient Instructions (Signed)
[facility] [place]  Discharge Instructions: Thank you for choosing [facility] to provide your oncology and hematology care.   If you have a lab appointment with the [facility], please go directly to the [facility] and check in at the registration area.  Wear comfortable clothing and clothing appropriate for easy access to any Portacath or PICC line.   We strive to give you quality time with your provider. You may need to reschedule your appointment if you arrive late (15 or more minutes).  Arriving late affects you and other patients whose appointments are after yours.  Also, if you miss three or more appointments without notifying the office, you may be dismissed from the clinic at the providers discretion.      For prescription refill requests, have your pharmacy contact our office and allow 72 hours for refills to be completed.    Today you received the following chemotherapy and/or immunotherapy agents Kyprolis, Aredia  and Cytoxan   To help prevent nausea and vomiting after your treatment, we encourage you to take your nausea medication as directed.  BELOW ARE SYMPTOMS THAT SHOULD BE REPORTED IMMEDIATELY: *FEVER GREATER THAN 100.4 F (38 C) OR HIGHER *CHILLS OR SWEATING *NAUSEA AND VOMITING THAT IS NOT CONTROLLED WITH YOUR NAUSEA MEDICATION *UNUSUAL SHORTNESS OF BREATH *UNUSUAL BRUISING OR BLEEDING *URINARY PROBLEMS (pain or burning when urinating, or frequent urination) *BOWEL PROBLEMS (unusual diarrhea, constipation, pain near the anus) TENDERNESS IN MOUTH AND THROAT WITH OR WITHOUT PRESENCE OF ULCERS (sore throat, sores in mouth, or a toothache) UNUSUAL RASH, SWELLING OR PAIN  UNUSUAL VAGINAL DISCHARGE OR ITCHING   Items with * indicate a potential emergency and should be followed up as soon as possible or go to the Emergency Department if any problems should occur.  Please show the CHEMOTHERAPY ALERT CARD or IMMUNOTHERAPY ALERT CARD at  check-in to the Emergency Department and triage nurse. Should you have questions after your visit or need to cancel or reschedule your appointment, please contact [facility]  [phone] and follow the prompts.  Office hours are 8:00 a.m. to 4:30 p.m. Monday - Friday. Please note that voicemails left after 4:00 p.m. may not be returned until the following business day.  We are closed weekends and major holidays. You have access to a nurse at all times for urgent questions. Please call the main number to the clinic [phone] and follow the prompts.  For any non-urgent questions, you may also contact your provider using MyChart. We now offer e-Visits for anyone 22 and older to request care online for non-urgent symptoms. For details visit [email or web address].   Also download the MyChart app! Go to the app store, search "MyChart", open the app, select [facility], and log in with your MyChart username and password.  Due to Covid, a mask is required upon entering the hospital/clinic. If you do not have a mask, one will be given to you upon arrival. For doctor visits, patients may have 1 support person aged 60 or older with them. For treatment visits, patients cannot have anyone with them due to current Covid guidelines and our immunocompromised population.

## 2021-02-07 NOTE — Progress Notes (Signed)
Okay to treat with elevated SCr per Dr. [name].

## 2021-02-08 LAB — IGG, IGA, IGM
IgA: 234 mg/dL (ref 61–437)
IgG (Immunoglobin G), Serum: 1037 mg/dL (ref 603–1613)
IgM (Immunoglobulin M), Srm: 32 mg/dL (ref 15–143)

## 2021-02-10 LAB — KAPPA/LAMBDA LIGHT CHAINS
Kappa free light chain: 54.2 mg/L — ABNORMAL HIGH (ref 3.3–19.4)
Kappa, lambda light chain ratio: 2.12 — ABNORMAL HIGH (ref 0.26–1.65)
Lambda free light chains: 25.6 mg/L (ref 5.7–26.3)

## 2021-02-12 LAB — PROTEIN ELECTROPHORESIS, SERUM
A/G Ratio: 1.3 (ref 0.7–1.7)
Albumin ELP: 3.8 g/dL (ref 2.9–4.4)
Alpha-1-Globulin: 0.2 g/dL (ref 0.0–0.4)
Alpha-2-Globulin: 0.8 g/dL (ref 0.4–1.0)
Beta Globulin: 0.9 g/dL (ref 0.7–1.3)
Gamma Globulin: 1 g/dL (ref 0.4–1.8)
Globulin, Total: 2.9 g/dL (ref 2.2–3.9)
Total Protein ELP: 6.7 g/dL (ref 6.0–8.5)

## 2021-03-07 ENCOUNTER — Inpatient Hospital Stay: Payer: 59

## 2021-03-07 ENCOUNTER — Inpatient Hospital Stay (HOSPITAL_BASED_OUTPATIENT_CLINIC_OR_DEPARTMENT_OTHER): Payer: 59 | Admitting: Family

## 2021-03-07 ENCOUNTER — Encounter: Payer: Self-pay | Admitting: Family

## 2021-03-07 ENCOUNTER — Telehealth: Payer: Self-pay | Admitting: *Deleted

## 2021-03-07 ENCOUNTER — Other Ambulatory Visit: Payer: Self-pay

## 2021-03-07 ENCOUNTER — Inpatient Hospital Stay: Payer: 59 | Attending: Hematology & Oncology

## 2021-03-07 VITALS — BP 151/71 | HR 88 | Temp 99.3°F | Resp 17 | Wt 189.0 lb

## 2021-03-07 DIAGNOSIS — D509 Iron deficiency anemia, unspecified: Secondary | ICD-10-CM | POA: Diagnosis not present

## 2021-03-07 DIAGNOSIS — M4712 Other spondylosis with myelopathy, cervical region: Secondary | ICD-10-CM

## 2021-03-07 DIAGNOSIS — Z5111 Encounter for antineoplastic chemotherapy: Secondary | ICD-10-CM | POA: Insufficient documentation

## 2021-03-07 DIAGNOSIS — G629 Polyneuropathy, unspecified: Secondary | ICD-10-CM | POA: Diagnosis not present

## 2021-03-07 DIAGNOSIS — Z299 Encounter for prophylactic measures, unspecified: Secondary | ICD-10-CM

## 2021-03-07 DIAGNOSIS — E291 Testicular hypofunction: Secondary | ICD-10-CM | POA: Diagnosis not present

## 2021-03-07 DIAGNOSIS — D500 Iron deficiency anemia secondary to blood loss (chronic): Secondary | ICD-10-CM | POA: Diagnosis not present

## 2021-03-07 DIAGNOSIS — C9000 Multiple myeloma not having achieved remission: Secondary | ICD-10-CM | POA: Diagnosis present

## 2021-03-07 DIAGNOSIS — E349 Endocrine disorder, unspecified: Secondary | ICD-10-CM

## 2021-03-07 DIAGNOSIS — D519 Vitamin B12 deficiency anemia, unspecified: Secondary | ICD-10-CM

## 2021-03-07 DIAGNOSIS — Z5112 Encounter for antineoplastic immunotherapy: Secondary | ICD-10-CM | POA: Diagnosis present

## 2021-03-07 DIAGNOSIS — N189 Chronic kidney disease, unspecified: Secondary | ICD-10-CM

## 2021-03-07 DIAGNOSIS — D631 Anemia in chronic kidney disease: Secondary | ICD-10-CM

## 2021-03-07 LAB — CBC WITH DIFFERENTIAL (CANCER CENTER ONLY)
Abs Immature Granulocytes: 0.1 10*3/uL — ABNORMAL HIGH (ref 0.00–0.07)
Basophils Absolute: 0.1 10*3/uL (ref 0.0–0.1)
Basophils Relative: 1 %
Eosinophils Absolute: 0.1 10*3/uL (ref 0.0–0.5)
Eosinophils Relative: 2 %
HCT: 32.9 % — ABNORMAL LOW (ref 39.0–52.0)
Hemoglobin: 10.5 g/dL — ABNORMAL LOW (ref 13.0–17.0)
Immature Granulocytes: 2 %
Lymphocytes Relative: 14 %
Lymphs Abs: 0.7 10*3/uL (ref 0.7–4.0)
MCH: 25.2 pg — ABNORMAL LOW (ref 26.0–34.0)
MCHC: 31.9 g/dL (ref 30.0–36.0)
MCV: 79.1 fL — ABNORMAL LOW (ref 80.0–100.0)
Monocytes Absolute: 0.8 10*3/uL (ref 0.1–1.0)
Monocytes Relative: 16 %
Neutro Abs: 3.5 10*3/uL (ref 1.7–7.7)
Neutrophils Relative %: 65 %
Platelet Count: 218 10*3/uL (ref 150–400)
RBC: 4.16 MIL/uL — ABNORMAL LOW (ref 4.22–5.81)
RDW: 15.8 % — ABNORMAL HIGH (ref 11.5–15.5)
WBC Count: 5.3 10*3/uL (ref 4.0–10.5)
nRBC: 0 % (ref 0.0–0.2)

## 2021-03-07 LAB — CMP (CANCER CENTER ONLY)
ALT: 14 U/L (ref 0–44)
AST: 16 U/L (ref 15–41)
Albumin: 4.3 g/dL (ref 3.5–5.0)
Alkaline Phosphatase: 79 U/L (ref 38–126)
Anion gap: 8 (ref 5–15)
BUN: 35 mg/dL — ABNORMAL HIGH (ref 8–23)
CO2: 30 mmol/L (ref 22–32)
Calcium: 8.8 mg/dL — ABNORMAL LOW (ref 8.9–10.3)
Chloride: 102 mmol/L (ref 98–111)
Creatinine: 2.64 mg/dL — ABNORMAL HIGH (ref 0.61–1.24)
GFR, Estimated: 25 mL/min — ABNORMAL LOW (ref >60)
Glucose, Bld: 174 mg/dL — ABNORMAL HIGH (ref 70–99)
Potassium: 4 mmol/L (ref 3.5–5.1)
Sodium: 140 mmol/L (ref 135–145)
Total Bilirubin: 0.5 mg/dL (ref 0.3–1.2)
Total Protein: 6.8 g/dL (ref 6.5–8.1)

## 2021-03-07 LAB — IRON AND IRON BINDING CAPACITY (CC-WL,HP ONLY)
Iron: 86 ug/dL (ref 45–182)
Saturation Ratios: 32 % (ref 17.9–39.5)
TIBC: 273 ug/dL (ref 250–450)
UIBC: 187 ug/dL (ref 117–376)

## 2021-03-07 LAB — LACTATE DEHYDROGENASE: LDH: 225 U/L — ABNORMAL HIGH (ref 98–192)

## 2021-03-07 MED ORDER — PALONOSETRON HCL INJECTION 0.25 MG/5ML
0.2500 mg | Freq: Once | INTRAVENOUS | Status: AC
  Administered 2021-03-07: 0.25 mg via INTRAVENOUS
  Filled 2021-03-07: qty 5

## 2021-03-07 MED ORDER — SODIUM CHLORIDE 0.9 % IV SOLN: Freq: Once | INTRAVENOUS | Status: DC

## 2021-03-07 MED ORDER — SODIUM CHLORIDE 0.9 % IV SOLN: Freq: Once | INTRAVENOUS | Status: AC

## 2021-03-07 MED ORDER — SODIUM CHLORIDE 0.9 % IV SOLN
60.0000 mg | Freq: Once | INTRAVENOUS | Status: AC
  Administered 2021-03-07: 60 mg via INTRAVENOUS
  Filled 2021-03-07: qty 10

## 2021-03-07 MED ORDER — SODIUM CHLORIDE 0.9% FLUSH
10.0000 mL | INTRAVENOUS | Status: DC | PRN
  Administered 2021-03-07: 10 mL

## 2021-03-07 MED ORDER — SODIUM CHLORIDE 0.9 % IV SOLN
10.0000 mg | Freq: Once | INTRAVENOUS | Status: AC
  Administered 2021-03-07: 10 mg via INTRAVENOUS
  Filled 2021-03-07: qty 10

## 2021-03-07 MED ORDER — HEPARIN SOD (PORK) LOCK FLUSH 100 UNIT/ML IV SOLN
500.0000 [IU] | Freq: Once | INTRAVENOUS | Status: AC | PRN
  Administered 2021-03-07: 500 [IU]

## 2021-03-07 MED ORDER — HYDROMORPHONE HCL 1 MG/ML IJ SOLN
2.0000 mg | Freq: Once | INTRAMUSCULAR | Status: AC
  Administered 2021-03-07: 2 mg via INTRAVENOUS
  Filled 2021-03-07: qty 2

## 2021-03-07 MED ORDER — DEXTROSE 5 % IV SOLN
29.0000 mg/m2 | Freq: Once | INTRAVENOUS | Status: AC
  Administered 2021-03-07: 60 mg via INTRAVENOUS
  Filled 2021-03-07: qty 30

## 2021-03-07 MED ORDER — SODIUM CHLORIDE 0.9 % IV SOLN
300.0000 mg/m2 | Freq: Once | INTRAVENOUS | Status: AC
  Administered 2021-03-07: 620 mg via INTRAVENOUS
  Filled 2021-03-07: qty 29

## 2021-03-07 MED ORDER — TESTOSTERONE CYPIONATE 200 MG/ML IM SOLN
400.0000 mg | Freq: Once | INTRAMUSCULAR | Status: AC
  Administered 2021-03-07: 400 mg via INTRAMUSCULAR
  Filled 2021-03-07: qty 2

## 2021-03-07 NOTE — Patient Instructions (Signed)

## 2021-03-07 NOTE — Progress Notes (Signed)
OK to treat with creatinine of 2.64 per Judson Roch, NP. Pt to receive Aredia today early per Judson Roch. dph

## 2021-03-07 NOTE — Telephone Encounter (Signed)
Per 12/26/21 los - gave upcoming appointments - confirmed 

## 2021-03-07 NOTE — Progress Notes (Signed)
Hematology and Oncology Follow Up Visit  [name] [id number] June 16, 1950 71 y.o. 03/07/2021   Principle Diagnosis:  IgG kappa myeloma Anemia secondary to renal insufficiency Intermittent iron - deficiency anemia Hypotestosteronemia   Current Therapy:        Aredia 60 mg IV q  month -- Retacrit SQ as needed for hemoglobin less than 10 DepoTestosterone 400 mg q 4 weeks IV iron as indicated Kyprolis/Cytoxan - s/p cycle 31   Interim History:  Mr. Armijo is here today for follow-up and treatment. He is doing fairly well but continues to struggles with the generalized aches and pains associated with arthritis.  He notes neuropathy in his hands and lower extremities. He states that it seems a little more prominent in both foot.  No falls or syncope to report.  No swelling in his extremities.  No M-spike observed in December, IgG level was 1,037 mg/dL and kappa light chains 5.42 mg/dL.  No blood loss noted. No abnormal bruising, no petechiae.  He has mild SOB at times with over exertion and takes a break to rest when needed.  No fever, chills, n/v, cough, rash, dizziness, chest pain, palpitations, abdominal pain or changes in bowel or bladder habits.  He states that he recently had an EGD and had his esophagus stretched. His appetite comes and goes. He is doing his best to stay well hydrated. His weight is 189 lbs.   ECOG Performance Status: 1 - Symptomatic but completely ambulatory  Medications:  Allergies as of 03/07/2021       Reactions   Iodinated Contrast Media Rash   Patient states he was instructed not to take IV contrast.  In 2008 he had an unknown reaction, and was told not to take it again.  He was also told not to take it due to his kidneys.   Iodine Anxiety, Rash, Other (See Comments)   Didn't feel right "instructed not to take per MD--something with his port"        Medication List        Accurate as of March 07, 2021  8:45 AM. If you have any questions,  ask your nurse or doctor.          amLODipine 10 MG tablet Commonly known as: NORVASC Take 10 mg by mouth daily.   atorvastatin 40 MG tablet Commonly known as: LIPITOR Take 40 mg by mouth daily at 6 PM.   Carafate 1 GM/10ML suspension Generic drug: sucralfate Take 1 g by mouth 4 (four) times daily.   carvedilol 25 MG tablet Commonly known as: COREG Take 25 mg by mouth 2 (two) times daily.   CVS Fiber Gummies 2 g Chew Chew by mouth daily.   dicyclomine 10 MG capsule Commonly known as: BENTYL TAKE 1 CAPSULE (10 MG TOTAL) BY MOUTH 3 (THREE) TIMES DAILY BEFORE MEALS.   dronabinol 2.5 MG capsule Commonly known as: MARINOL Take 2.5 mg by mouth 2 (two) times daily before lunch and supper.   gabapentin 300 MG capsule Commonly known as: NEURONTIN TAKE 1 CAPSULE BY MOUTH THREE TIMES A DAY What changed:  how much to take how to take this when to take this additional instructions   glimepiride 2 MG tablet Commonly known as: AMARYL Take 2 mg by mouth daily.   hydrocortisone 2.5 % cream Apply topically.   lactulose 10 GM/15ML solution Commonly known as: CHRONULAC Take 30 mLs (20 g total) by mouth daily.   latanoprost 0.005 % ophthalmic solution Commonly known as: XALATAN 1  drop at bedtime.   linaclotide 145 MCG Caps capsule Commonly known as: LINZESS Take by mouth daily as needed.   losartan 50 MG tablet Commonly known as: COZAAR Take 50 mg by mouth daily.   Lumigan 0.01 % Soln Generic drug: bimatoprost Place 1 drop into both eyes 3 (three) times daily.   metFORMIN 500 MG tablet Commonly known as: GLUCOPHAGE Take 500 mg by mouth 2 (two) times daily.   metoprolol succinate 25 MG 24 hr tablet Commonly known as: TOPROL-XL Take 25 mg by mouth daily.   mirabegron ER 50 MG Tb24 tablet Commonly known as: MYRBETRIQ Take 50 mg by mouth daily.   morphine 30 MG 12 hr tablet Commonly known as: MS CONTIN Take 30 mg by mouth 2 (two) times daily.   omeprazole  40 MG capsule Commonly known as: PRILOSEC TAKE 1 CAPSULE BY MOUTH TWICE A DAY   ondansetron 8 MG tablet Commonly known as: ZOFRAN TAKE 1 TABLET(8 MG) BY MOUTH TWICE DAILY AS NEEDED FOR NAUSEA OR VOMITING   oxyCODONE 5 MG immediate release tablet Commonly known as: Roxicodone 1 tab PO q6 hours prn pain   oxyCODONE-acetaminophen 10-325 MG tablet Commonly known as: PERCOCET Take 1 tablet by mouth every 6 (six) hours as needed for pain.   Pfizer COVID-19 Vac Bivalent injection Generic drug: COVID-19 mRNA bivalent vaccine Therapist, music) Inject into the muscle.   pioglitazone 30 MG tablet Commonly known as: ACTOS Take 30 mg by mouth daily.   prochlorperazine 10 MG tablet Commonly known as: COMPAZINE take 1 tablet by mouth every 6 hours if needed for nausea and vomiting   Restasis 0.05 % ophthalmic emulsion Generic drug: cycloSPORINE Place 1 drop into both eyes daily.   SUMAtriptan 100 MG tablet Commonly known as: IMITREX Take 100 mg by mouth daily.   tadalafil 10 MG tablet Commonly known as: CIALIS Take by mouth.   topiramate 25 MG capsule Commonly known as: TOPAMAX Take 25 mg by mouth 2 (two) times daily.   torsemide 20 MG tablet Commonly known as: DEMADEX Take 40 mg by mouth daily.   Trulicity 0.93 OI/7.1IW Sopn Generic drug: Dulaglutide Inject 0.75 mg into the skin every Saturday.   Vitamin D (Ergocalciferol) 1.25 MG (50000 UNIT) Caps capsule Commonly known as: DRISDOL Take 50,000 Units by mouth every Saturday.        Allergies:  Allergies  Allergen Reactions   Iodinated Contrast Media Rash    Patient states he was instructed not to take IV contrast.  In 2008 he had an unknown reaction, and was told not to take it again.  He was also told not to take it due to his kidneys.   Iodine Anxiety, Rash and Other (See Comments)    Didn't feel right "instructed not to take per MD--something with his port"     Past Medical History, Surgical history, Social history,  and Family History were reviewed and updated.  Review of Systems: All other 10 point review of systems is negative.   Physical Exam:  weight is 189 lb (85.7 kg). His oral temperature is 99.3 F (37.4 C). His blood pressure is 151/71 (abnormal) and his pulse is 88. His respiration is 17 and oxygen saturation is 99%.   Wt Readings from Last 3 Encounters:  03/07/21 189 lb (85.7 kg)  02/07/21 193 lb (87.5 kg)  01/08/21 190 lb (86.2 kg)    Ocular: Sclerae unicteric, pupils equal, round and reactive to light Ear-nose-throat: Oropharynx clear, dentition fair Lymphatic: No cervical or  supraclavicular adenopathy Lungs no rales or rhonchi, good excursion bilaterally Heart regular rate and rhythm, no murmur appreciated Abd soft, nontender, positive bowel sounds MSK no focal spinal tenderness, no joint edema Neuro: non-focal, well-oriented, appropriate affect Breasts: Deferred   Lab Results  Component Value Date   WBC 5.3 03/07/2021   HGB 10.5 (L) 03/07/2021   HCT 32.9 (L) 03/07/2021   MCV 79.1 (L) 03/07/2021   PLT 218 03/07/2021   Lab Results  Component Value Date   FERRITIN 2,058 (H) 02/07/2021   IRON 80 02/07/2021   TIBC 241 02/07/2021   UIBC 161 02/07/2021   IRONPCTSAT 33 02/07/2021   Lab Results  Component Value Date   RETICCTPCT 0.7 12/10/2020   RBC 4.16 (L) 03/07/2021   RETICCTABS 27.5 03/21/2014   Lab Results  Component Value Date   KPAFRELGTCHN 54.2 (H) 02/07/2021   LAMBDASER 25.6 02/07/2021   KAPLAMBRATIO 2.12 (H) 02/07/2021   Lab Results  Component Value Date   IGGSERUM 1,037 02/07/2021   IGA 234 02/07/2021   IGMSERUM 32 02/07/2021   Lab Results  Component Value Date   TOTALPROTELP 6.7 02/07/2021   ALBUMINELP 3.8 02/07/2021   A1GS 0.2 02/07/2021   A2GS 0.8 02/07/2021   BETS 0.9 02/07/2021   BETA2SER 0.5 11/07/2014   GAMS 1.0 02/07/2021   MSPIKE Not Observed 02/07/2021   SPEI Comment 02/07/2021     Chemistry      Component Value Date/Time   NA  140 03/07/2021 0810   NA 143 02/22/2017 0744   K 4.0 03/07/2021 0810   K 4.0 02/22/2017 0744   CL 102 03/07/2021 0810   CL 106 02/22/2017 0744   CO2 30 03/07/2021 0810   CO2 27 02/22/2017 0744   BUN 35 (H) 03/07/2021 0810   BUN 26 (H) 02/22/2017 0744   CREATININE 2.64 (H) 03/07/2021 0810   CREATININE 2.3 (H) 02/22/2017 0744      Component Value Date/Time   CALCIUM 8.8 (L) 03/07/2021 0810   CALCIUM 9.3 02/22/2017 0744   ALKPHOS 79 03/07/2021 0810   ALKPHOS 80 02/22/2017 0744   AST 16 03/07/2021 0810   ALT 14 03/07/2021 0810   ALT 22 02/22/2017 0744   BILITOT 0.5 03/07/2021 0810       Impression and Plan: Mr. Vences is a very pleasant 71 yo African American gentleman with IgG kappa myeloma.  His myeloma studies have been stable. Today's results are pending.  We will proceed with treatment today as planned.  Iron studies are pending.  Follow-up in 1 month.   Lottie Dawson, NP 1/13/20238:45 AM

## 2021-03-07 NOTE — Patient Instructions (Signed)
[facility] [place]  Discharge Instructions: Thank you for choosing [facility] to provide your oncology and hematology care.   If you have a lab appointment with the [facility], please go directly to the [facility] and check in at the registration area.  Wear comfortable clothing and clothing appropriate for easy access to any Portacath or PICC line.   We strive to give you quality time with your provider. You may need to reschedule your appointment if you arrive late (15 or more minutes).  Arriving late affects you and other patients whose appointments are after yours.  Also, if you miss three or more appointments without notifying the office, you may be dismissed from the clinic at the providers discretion.      For prescription refill requests, have your pharmacy contact our office and allow 72 hours for refills to be completed.    Today you received the following chemotherapy and/or immunotherapy agents Aredia, Kyprolis, Cytoxan      To help prevent nausea and vomiting after your treatment, we encourage you to take your nausea medication as directed.  BELOW ARE SYMPTOMS THAT SHOULD BE REPORTED IMMEDIATELY: *FEVER GREATER THAN 100.4 F (38 C) OR HIGHER *CHILLS OR SWEATING *NAUSEA AND VOMITING THAT IS NOT CONTROLLED WITH YOUR NAUSEA MEDICATION *UNUSUAL SHORTNESS OF BREATH *UNUSUAL BRUISING OR BLEEDING *URINARY PROBLEMS (pain or burning when urinating, or frequent urination) *BOWEL PROBLEMS (unusual diarrhea, constipation, pain near the anus) TENDERNESS IN MOUTH AND THROAT WITH OR WITHOUT PRESENCE OF ULCERS (sore throat, sores in mouth, or a toothache) UNUSUAL RASH, SWELLING OR PAIN  UNUSUAL VAGINAL DISCHARGE OR ITCHING   Items with * indicate a potential emergency and should be followed up as soon as possible or go to the Emergency Department if any problems should occur.  Please show the CHEMOTHERAPY ALERT CARD or IMMUNOTHERAPY ALERT CARD at  check-in to the Emergency Department and triage nurse. Should you have questions after your visit or need to cancel or reschedule your appointment, please contact [facility]  [phone] and follow the prompts.  Office hours are 8:00 a.m. to 4:30 p.m. Monday - Friday. Please note that voicemails left after 4:00 p.m. may not be returned until the following business day.  We are closed weekends and major holidays. You have access to a nurse at all times for urgent questions. Please call the main number to the clinic [phone] and follow the prompts.  For any non-urgent questions, you may also contact your provider using MyChart. We now offer e-Visits for anyone 55 and older to request care online for non-urgent symptoms. For details visit [email or web address].   Also download the MyChart app! Go to the app store, search "MyChart", open the app, select [facility], and log in with your MyChart username and password.  Due to Covid, a mask is required upon entering the hospital/clinic. If you do not have a mask, one will be given to you upon arrival. For doctor visits, patients may have 1 support person aged 68 or older with them. For treatment visits, patients cannot have anyone with them due to current Covid guidelines and our immunocompromised population.

## 2021-03-08 LAB — IGG, IGA, IGM
IgA: 229 mg/dL (ref 61–437)
IgG (Immunoglobin G), Serum: 1090 mg/dL (ref 603–1613)
IgM (Immunoglobulin M), Srm: 37 mg/dL (ref 15–143)

## 2021-03-10 LAB — PROTEIN ELECTROPHORESIS, SERUM
A/G Ratio: 1.2 (ref 0.7–1.7)
Albumin ELP: 3.7 g/dL (ref 2.9–4.4)
Alpha-1-Globulin: 0.2 g/dL (ref 0.0–0.4)
Alpha-2-Globulin: 0.9 g/dL (ref 0.4–1.0)
Beta Globulin: 0.9 g/dL (ref 0.7–1.3)
Gamma Globulin: 1.1 g/dL (ref 0.4–1.8)
Globulin, Total: 3.1 g/dL (ref 2.2–3.9)
Total Protein ELP: 6.8 g/dL (ref 6.0–8.5)

## 2021-03-10 LAB — KAPPA/LAMBDA LIGHT CHAINS
Kappa free light chain: 42.9 mg/L — ABNORMAL HIGH (ref 3.3–19.4)
Kappa, lambda light chain ratio: 1.98 — ABNORMAL HIGH (ref 0.26–1.65)
Lambda free light chains: 21.7 mg/L (ref 5.7–26.3)

## 2021-03-27 LAB — FERRITIN: Ferritin: 1805 ng/mL — ABNORMAL HIGH (ref 24–336)

## 2021-04-04 ENCOUNTER — Encounter: Payer: Self-pay | Admitting: Podiatry

## 2021-04-04 ENCOUNTER — Ambulatory Visit (INDEPENDENT_AMBULATORY_CARE_PROVIDER_SITE_OTHER): Payer: 59 | Admitting: Podiatry

## 2021-04-04 ENCOUNTER — Other Ambulatory Visit: Payer: Self-pay

## 2021-04-04 DIAGNOSIS — B351 Tinea unguium: Secondary | ICD-10-CM

## 2021-04-04 DIAGNOSIS — M79675 Pain in left toe(s): Secondary | ICD-10-CM | POA: Diagnosis not present

## 2021-04-04 DIAGNOSIS — E1151 Type 2 diabetes mellitus with diabetic peripheral angiopathy without gangrene: Secondary | ICD-10-CM

## 2021-04-04 DIAGNOSIS — M79674 Pain in right toe(s): Secondary | ICD-10-CM

## 2021-04-04 DIAGNOSIS — M2010 Hallux valgus (acquired), unspecified foot: Secondary | ICD-10-CM

## 2021-04-04 NOTE — Progress Notes (Signed)
This patient returns to my office for at risk foot care.  This patient requires this care by a professional since this patient will be at risk due to having diabetes type 2.  This patient is unable to cut nails himself since the patient cannot reach his nails.These nails are painful walking and wearing shoes.  Painful callus under outside ball of both feet.This patient presents for at risk foot care today.  General Appearance  Alert, conversant and in no acute stress.  Vascular  Dorsalis pedis and posterior tibial  pulses are weakly  palpable  bilaterally.  Capillary return is within normal limits  bilaterally. Cold feet bilaterally.  Absent digital hair.  Neurologic  Senn-Weinstein monofilament wire test within normal limits/diminished right.  LOPS absent left foot. Muscle power within normal limits bilaterally.  Nails Thick disfigured discolored nails with subungual debris  from hallux to fifth toes bilaterally. No evidence of bacterial infection or drainage bilaterally.  Orthopedic  No limitations of motion  feet .  No crepitus or effusions noted.  No bony pathology or digital deformities noted.  HAV  B/L.  Exostosis right foot.Plantar flexed fifth metatarsall B/l.  Skin  normotropic skin with no porokeratosis noted bilaterally.  No signs of infections or ulcers noted.  Callus sub 5th met  B/l. No infection.  Onychomycosis  Pain in right toes  Pain in left toes  Porokeratosis  B/L  Consent was obtained for treatment procedures.   Mechanical debridement of nails 1-5  bilaterally performed with a nail nipper.  Filed with dremel without incident. Debride callus sub 5th  B/L  patient qualifies for diabetic shoes due to DPN and HAV  B/L.  Patient request surgical consult for painful bunion and fifth toes  B/L.   Return office visit    10 weeks                  Told patient to return for periodic foot care and evaluation due to potential at risk complications.   Gardiner Barefoot DPM

## 2021-04-07 ENCOUNTER — Encounter: Payer: Self-pay | Admitting: Hematology & Oncology

## 2021-04-07 ENCOUNTER — Inpatient Hospital Stay (HOSPITAL_BASED_OUTPATIENT_CLINIC_OR_DEPARTMENT_OTHER): Payer: 59 | Admitting: Hematology & Oncology

## 2021-04-07 ENCOUNTER — Other Ambulatory Visit: Payer: Self-pay

## 2021-04-07 ENCOUNTER — Inpatient Hospital Stay: Payer: 59 | Attending: Hematology & Oncology

## 2021-04-07 ENCOUNTER — Inpatient Hospital Stay: Payer: 59

## 2021-04-07 VITALS — BP 133/68 | HR 83 | Temp 99.0°F | Resp 18 | Ht 71.0 in | Wt 183.2 lb

## 2021-04-07 DIAGNOSIS — C9000 Multiple myeloma not having achieved remission: Secondary | ICD-10-CM

## 2021-04-07 DIAGNOSIS — E349 Endocrine disorder, unspecified: Secondary | ICD-10-CM

## 2021-04-07 DIAGNOSIS — E291 Testicular hypofunction: Secondary | ICD-10-CM | POA: Insufficient documentation

## 2021-04-07 DIAGNOSIS — D509 Iron deficiency anemia, unspecified: Secondary | ICD-10-CM | POA: Diagnosis not present

## 2021-04-07 DIAGNOSIS — M4712 Other spondylosis with myelopathy, cervical region: Secondary | ICD-10-CM

## 2021-04-07 DIAGNOSIS — Z5111 Encounter for antineoplastic chemotherapy: Secondary | ICD-10-CM | POA: Insufficient documentation

## 2021-04-07 DIAGNOSIS — Z5112 Encounter for antineoplastic immunotherapy: Secondary | ICD-10-CM | POA: Insufficient documentation

## 2021-04-07 DIAGNOSIS — N189 Chronic kidney disease, unspecified: Secondary | ICD-10-CM

## 2021-04-07 DIAGNOSIS — D500 Iron deficiency anemia secondary to blood loss (chronic): Secondary | ICD-10-CM

## 2021-04-07 DIAGNOSIS — E119 Type 2 diabetes mellitus without complications: Secondary | ICD-10-CM | POA: Insufficient documentation

## 2021-04-07 DIAGNOSIS — Z299 Encounter for prophylactic measures, unspecified: Secondary | ICD-10-CM

## 2021-04-07 DIAGNOSIS — D631 Anemia in chronic kidney disease: Secondary | ICD-10-CM

## 2021-04-07 LAB — CMP (CANCER CENTER ONLY)
ALT: 11 U/L (ref 0–44)
AST: 19 U/L (ref 15–41)
Albumin: 3.8 g/dL (ref 3.5–5.0)
Alkaline Phosphatase: 70 U/L (ref 38–126)
Anion gap: 8 (ref 5–15)
BUN: 20 mg/dL (ref 8–23)
CO2: 26 mmol/L (ref 22–32)
Calcium: 8.8 mg/dL — ABNORMAL LOW (ref 8.9–10.3)
Chloride: 104 mmol/L (ref 98–111)
Creatinine: 2.18 mg/dL — ABNORMAL HIGH (ref 0.61–1.24)
GFR, Estimated: 32 mL/min — ABNORMAL LOW (ref >60)
Glucose, Bld: 98 mg/dL (ref 70–99)
Potassium: 4.2 mmol/L (ref 3.5–5.1)
Sodium: 138 mmol/L (ref 135–145)
Total Bilirubin: 0.7 mg/dL (ref 0.3–1.2)
Total Protein: 6.9 g/dL (ref 6.5–8.1)

## 2021-04-07 LAB — CBC WITH DIFFERENTIAL (CANCER CENTER ONLY)
Abs Immature Granulocytes: 0.02 10*3/uL (ref 0.00–0.07)
Basophils Absolute: 0.1 10*3/uL (ref 0.0–0.1)
Basophils Relative: 1 %
Eosinophils Absolute: 0.1 10*3/uL (ref 0.0–0.5)
Eosinophils Relative: 2 %
HCT: 33.5 % — ABNORMAL LOW (ref 39.0–52.0)
Hemoglobin: 10.7 g/dL — ABNORMAL LOW (ref 13.0–17.0)
Immature Granulocytes: 0 %
Lymphocytes Relative: 13 %
Lymphs Abs: 0.7 10*3/uL (ref 0.7–4.0)
MCH: 25.2 pg — ABNORMAL LOW (ref 26.0–34.0)
MCHC: 31.9 g/dL (ref 30.0–36.0)
MCV: 79 fL — ABNORMAL LOW (ref 80.0–100.0)
Monocytes Absolute: 0.5 10*3/uL (ref 0.1–1.0)
Monocytes Relative: 9 %
Neutro Abs: 4.4 10*3/uL (ref 1.7–7.7)
Neutrophils Relative %: 75 %
Platelet Count: 158 10*3/uL (ref 150–400)
RBC: 4.24 MIL/uL (ref 4.22–5.81)
RDW: 16.5 % — ABNORMAL HIGH (ref 11.5–15.5)
WBC Count: 5.8 10*3/uL (ref 4.0–10.5)
nRBC: 0 % (ref 0.0–0.2)

## 2021-04-07 LAB — LACTATE DEHYDROGENASE: LDH: 226 U/L — ABNORMAL HIGH (ref 98–192)

## 2021-04-07 LAB — IRON AND IRON BINDING CAPACITY (CC-WL,HP ONLY)
Iron: 80 ug/dL (ref 45–182)
Saturation Ratios: 32 % (ref 17.9–39.5)
TIBC: 249 ug/dL — ABNORMAL LOW (ref 250–450)
UIBC: 169 ug/dL (ref 117–376)

## 2021-04-07 LAB — FERRITIN: Ferritin: 1525 ng/mL — ABNORMAL HIGH (ref 24–336)

## 2021-04-07 MED ORDER — DEXTROSE 5 % IV SOLN
28.6000 mg/m2 | Freq: Once | INTRAVENOUS | Status: AC
  Administered 2021-04-07: 60 mg via INTRAVENOUS
  Filled 2021-04-07: qty 30

## 2021-04-07 MED ORDER — SODIUM CHLORIDE 0.9 % IV SOLN: Freq: Once | INTRAVENOUS | Status: DC

## 2021-04-07 MED ORDER — HYDROMORPHONE HCL 1 MG/ML IJ SOLN
2.0000 mg | Freq: Once | INTRAMUSCULAR | Status: AC
  Administered 2021-04-07: 2 mg via INTRAVENOUS
  Filled 2021-04-07: qty 2

## 2021-04-07 MED ORDER — SODIUM CHLORIDE 0.9 % IV SOLN
300.0000 mg/m2 | Freq: Once | INTRAVENOUS | Status: AC
  Administered 2021-04-07: 620 mg via INTRAVENOUS
  Filled 2021-04-07: qty 31

## 2021-04-07 MED ORDER — HEPARIN SOD (PORK) LOCK FLUSH 100 UNIT/ML IV SOLN
500.0000 [IU] | Freq: Once | INTRAVENOUS | Status: AC | PRN
  Administered 2021-04-07: 500 [IU]

## 2021-04-07 MED ORDER — SODIUM CHLORIDE 0.9 % IV SOLN
60.0000 mg | Freq: Once | INTRAVENOUS | Status: AC
  Administered 2021-04-07: 60 mg via INTRAVENOUS
  Filled 2021-04-07: qty 10

## 2021-04-07 MED ORDER — PALONOSETRON HCL INJECTION 0.25 MG/5ML
0.2500 mg | Freq: Once | INTRAVENOUS | Status: AC
  Administered 2021-04-07: 0.25 mg via INTRAVENOUS
  Filled 2021-04-07: qty 5

## 2021-04-07 MED ORDER — SODIUM CHLORIDE 0.9 % IV SOLN
10.0000 mg | Freq: Once | INTRAVENOUS | Status: AC
  Administered 2021-04-07: 10 mg via INTRAVENOUS
  Filled 2021-04-07: qty 10

## 2021-04-07 MED ORDER — TESTOSTERONE CYPIONATE 200 MG/ML IM SOLN
400.0000 mg | Freq: Once | INTRAMUSCULAR | Status: AC
  Administered 2021-04-07: 400 mg via INTRAMUSCULAR
  Filled 2021-04-07: qty 2

## 2021-04-07 MED ORDER — SODIUM CHLORIDE 0.9% FLUSH
10.0000 mL | INTRAVENOUS | Status: DC | PRN
  Administered 2021-04-07: 10 mL

## 2021-04-07 MED ORDER — CYANOCOBALAMIN 1000 MCG/ML IJ SOLN
1000.0000 ug | Freq: Once | INTRAMUSCULAR | Status: AC
  Administered 2021-04-07: 1000 ug via INTRAMUSCULAR
  Filled 2021-04-07: qty 1

## 2021-04-07 MED ORDER — SODIUM CHLORIDE 0.9 % IV SOLN: Freq: Once | INTRAVENOUS | Status: AC

## 2021-04-07 NOTE — Progress Notes (Signed)
Hematology and Oncology Follow Up Visit  [name] [id number] 1951-01-24 71 y.o. 04/07/2021   Principle Diagnosis:  IgG kappa myeloma Anemia secondary to renal insufficiency Intermittent iron - deficiency anemia Hypotestosteronemia   Current Therapy:        Aredia 60 mg IV q  month -- Retacrit SQ as needed for hemoglobin less than 10 DepoTestosterone 400 mg q 4 weeks IV iron as indicated Kyprolis/Cytoxan - s/p cycle #31   Interim History:  [name] is here today for follow-up and treatment.  As always, we talked about sports.  We talked about the Super Bowl.  We both enjoyed the game.  His arthritis is bothering him quite a bit.  This always happens after the weather changes.  The weather yesterday, being wet and cold certainly was no good for his arthritis.  He has had no problems with his diabetes.  He is trying to watch this as closely as possible.  His myeloma studies back in January did not show monoclonal spike in his blood.  The IgG level was 1090 mg/dL.  The Kappa light chain was 4.3 mg/dL.  He has had no bleeding.  He has had no leg swelling.  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  Overall, I would say his performance status is probably ECOG 1.    Medications:  Allergies as of 04/07/2021       Reactions   Iodinated Contrast Media Rash   Patient states he was instructed not to take IV contrast.  In 2008 he had an unknown reaction, and was told not to take it again.  He was also told not to take it due to his kidneys.   Iodine Anxiety, Rash, Other (See Comments)   Didn't feel right "instructed not to take per MD--something with his port"        Medication List        Accurate as of April 07, 2021  8:48 AM. If you have any questions, ask your nurse or doctor.          amLODipine 10 MG tablet Commonly known as: NORVASC Take 10 mg by mouth daily.   atorvastatin 40 MG tablet Commonly known as: LIPITOR Take 40 mg by mouth daily at 6 PM.    Carafate 1 GM/10ML suspension Generic drug: sucralfate Take 1 g by mouth 4 (four) times daily.   carvedilol 25 MG tablet Commonly known as: COREG Take 25 mg by mouth 2 (two) times daily.   [employer] Fiber Gummies 2 g Chew Chew by mouth daily.   dicyclomine 10 MG capsule Commonly known as: BENTYL TAKE 1 CAPSULE (10 MG TOTAL) BY MOUTH 3 (THREE) TIMES DAILY BEFORE MEALS.   dronabinol 2.5 MG capsule Commonly known as: MARINOL Take 2.5 mg by mouth 2 (two) times daily before lunch and supper.   gabapentin 300 MG capsule Commonly known as: NEURONTIN TAKE 1 CAPSULE BY MOUTH THREE TIMES A DAY What changed:  how much to take how to take this when to take this additional instructions   glimepiride 2 MG tablet Commonly known as: AMARYL Take 2 mg by mouth daily.   hydrocortisone 2.5 % cream Apply topically.   lactulose 10 GM/15ML solution Commonly known as: CHRONULAC Take 30 mLs (20 g total) by mouth daily.   latanoprost 0.005 % ophthalmic solution Commonly known as: XALATAN 1 drop at bedtime.   linaclotide 145 MCG Caps capsule Commonly known as: LINZESS Take by mouth daily as needed.   losartan 50 MG  tablet Commonly known as: COZAAR Take 50 mg by mouth daily.   Lumigan 0.01 % Soln Generic drug: bimatoprost Place 1 drop into both eyes 3 (three) times daily.   metFORMIN 500 MG tablet Commonly known as: GLUCOPHAGE Take 500 mg by mouth 2 (two) times daily.   metoprolol succinate 25 MG 24 hr tablet Commonly known as: TOPROL-XL Take 25 mg by mouth daily.   mirabegron ER 25 MG Tb24 tablet Commonly known as: MYRBETRIQ Take 20 mg by mouth daily. What changed: Another medication with the same name was removed. Continue taking this medication, and follow the directions you see here. Changed by: Volanda Napoleon, MD   morphine 30 MG 12 hr tablet Commonly known as: MS CONTIN Take 30 mg by mouth 2 (two) times daily.   omeprazole 40 MG capsule Commonly known as:  PRILOSEC TAKE 1 CAPSULE BY MOUTH TWICE A DAY   ondansetron 8 MG tablet Commonly known as: ZOFRAN TAKE 1 TABLET(8 MG) BY MOUTH TWICE DAILY AS NEEDED FOR NAUSEA OR VOMITING   oxyCODONE 5 MG immediate release tablet Commonly known as: Roxicodone 1 tab PO q6 hours prn pain   oxyCODONE-acetaminophen 10-325 MG tablet Commonly known as: PERCOCET Take 1 tablet by mouth every 6 (six) hours as needed for pain.   Pfizer COVID-19 Vac Bivalent injection Generic drug: COVID-19 mRNA bivalent vaccine Therapist, music) Inject into the muscle.   pioglitazone 30 MG tablet Commonly known as: ACTOS Take 30 mg by mouth daily.   prochlorperazine 10 MG tablet Commonly known as: COMPAZINE take 1 tablet by mouth every 6 hours if needed for nausea and vomiting   Restasis 0.05 % ophthalmic emulsion Generic drug: cycloSPORINE Place 1 drop into both eyes daily.   SUMAtriptan 100 MG tablet Commonly known as: IMITREX Take 100 mg by mouth daily.   tadalafil 10 MG tablet Commonly known as: CIALIS Take by mouth.   topiramate 25 MG capsule Commonly known as: TOPAMAX Take 25 mg by mouth 2 (two) times daily.   torsemide 20 MG tablet Commonly known as: DEMADEX Take 40 mg by mouth daily.   Trulicity 1.61 WR/6.0AV Sopn Generic drug: Dulaglutide Inject 0.75 mg into the skin every Saturday.   Vitamin D (Ergocalciferol) 1.25 MG (50000 UNIT) Caps capsule Commonly known as: DRISDOL Take 50,000 Units by mouth every Saturday.        Allergies:  Allergies  Allergen Reactions   Iodinated Contrast Media Rash    Patient states he was instructed not to take IV contrast.  In 2008 he had an unknown reaction, and was told not to take it again.  He was also told not to take it due to his kidneys.   Iodine Anxiety, Rash and Other (See Comments)    Didn't feel right "instructed not to take per MD--something with his port"     Past Medical History, Surgical history, Social history, and Family History were reviewed  and updated.  Review of Systems: Review of Systems  Constitutional:  Positive for malaise/fatigue.  HENT: Negative.    Eyes: Negative.   Respiratory: Negative.    Cardiovascular: Negative.   Gastrointestinal: Negative.   Genitourinary: Negative.   Musculoskeletal:  Positive for back pain and joint pain.  Skin: Negative.   Neurological: Negative.   Endo/Heme/Allergies: Negative.   Psychiatric/Behavioral: Negative.      Physical Exam:  height is 5\' 11"  (1.803 m) and weight is 183 lb 4 oz (83.1 kg). His oral temperature is 99 F (37.2 C). His blood pressure  is 133/68 and his pulse is 83. His respiration is 18 and oxygen saturation is 96%.   Wt Readings from Last 3 Encounters:  04/07/21 183 lb 4 oz (83.1 kg)  03/07/21 189 lb (85.7 kg)  02/07/21 193 lb (87.5 kg)   Physical Exam Vitals reviewed.  HENT:     Head: Normocephalic and atraumatic.  Eyes:     Pupils: Pupils are equal, round, and reactive to light.  Cardiovascular:     Rate and Rhythm: Normal rate and regular rhythm.     Heart sounds: Normal heart sounds.  Pulmonary:     Effort: Pulmonary effort is normal.     Breath sounds: Normal breath sounds.  Abdominal:     General: Bowel sounds are normal.     Palpations: Abdomen is soft.  Musculoskeletal:        General: No tenderness or deformity. Normal range of motion.     Cervical back: Normal range of motion.  Lymphadenopathy:     Cervical: No cervical adenopathy.  Skin:    General: Skin is warm and dry.     Findings: No erythema or rash.  Neurological:     Mental Status: He is alert and oriented to person, place, and time.  Psychiatric:        Behavior: Behavior normal.        Thought Content: Thought content normal.        Judgment: Judgment normal.    Lab Results  Component Value Date   WBC 5.8 04/07/2021   HGB 10.7 (L) 04/07/2021   HCT 33.5 (L) 04/07/2021   MCV 79.0 (L) 04/07/2021   PLT 158 04/07/2021   Lab Results  Component Value Date   FERRITIN  1,805 (H) 03/07/2021   IRON 86 03/07/2021   TIBC 273 03/07/2021   UIBC 187 03/07/2021   IRONPCTSAT 32 03/07/2021   Lab Results  Component Value Date   RETICCTPCT 0.7 12/10/2020   RBC 4.24 04/07/2021   RETICCTABS 27.5 03/21/2014   Lab Results  Component Value Date   KPAFRELGTCHN 42.9 (H) 03/07/2021   LAMBDASER 21.7 03/07/2021   KAPLAMBRATIO 1.98 (H) 03/07/2021   Lab Results  Component Value Date   IGGSERUM 1,090 03/07/2021   IGA 229 03/07/2021   IGMSERUM 37 03/07/2021   Lab Results  Component Value Date   TOTALPROTELP 6.8 03/07/2021   ALBUMINELP 3.7 03/07/2021   A1GS 0.2 03/07/2021   A2GS 0.9 03/07/2021   BETS 0.9 03/07/2021   BETA2SER 0.5 11/07/2014   GAMS 1.1 03/07/2021   MSPIKE Not Observed 03/07/2021   SPEI Comment 03/07/2021     Chemistry      Component Value Date/Time   NA 138 04/07/2021 0815   NA 143 02/22/2017 0744   K 4.2 04/07/2021 0815   K 4.0 02/22/2017 0744   CL 104 04/07/2021 0815   CL 106 02/22/2017 0744   CO2 26 04/07/2021 0815   CO2 27 02/22/2017 0744   BUN 20 04/07/2021 0815   BUN 26 (H) 02/22/2017 0744   CREATININE 2.18 (H) 04/07/2021 0815   CREATININE 2.3 (H) 02/22/2017 0744      Component Value Date/Time   CALCIUM 8.8 (L) 04/07/2021 0815   CALCIUM 9.3 02/22/2017 0744   ALKPHOS 70 04/07/2021 0815   ALKPHOS 80 02/22/2017 0744   AST 19 04/07/2021 0815   ALT 11 04/07/2021 0815   ALT 22 02/22/2017 0744   BILITOT 0.7 04/07/2021 0815       Impression and Plan: Mr. Kelleher  is a very pleasant 71 yo African American gentleman with IgG kappa myeloma.  We have been following this now for several years.  He has done incredibly well with this.  I still think that his long-term problem will be the diabetes.  He does have chronic renal insufficiency.  His labs for his renal function are not all that bad.  Does that overall, his quality of life is not doing all that bad.  He gets treated monthly at this point.  Again this is a good interval  for him.  We will have him come back to see [place] in 1 month.  He gets his Aredia today.     Volanda Napoleon, MD 2/13/20238:48 AM

## 2021-04-07 NOTE — Progress Notes (Signed)
OK to treat today with creat-2.18 per order of Dr. [name].

## 2021-04-07 NOTE — Patient Instructions (Signed)
[facility]  Discharge Instructions: Thank you for choosing [facility] to provide your oncology and hematology care.   If you have a lab appointment with the [facility], please go directly to the [facility] and check in at the registration area.  Wear comfortable clothing and clothing appropriate for easy access to any Portacath or PICC line.   We strive to give you quality time with your provider. You may need to reschedule your appointment if you arrive late (15 or more minutes).  Arriving late affects you and other patients whose appointments are after yours.  Also, if you miss three or more appointments without notifying the office, you may be dismissed from the clinic at the providers discretion.      For prescription refill requests, have your pharmacy contact our office and allow 72 hours for refills to be completed.    Today you received the following chemotherapy and/or immunotherapy agents: Cytoxan and Kyprolis      To help prevent nausea and vomiting after your treatment, we encourage you to take your nausea medication as directed.  BELOW ARE SYMPTOMS THAT SHOULD BE REPORTED IMMEDIATELY: *FEVER GREATER THAN 100.4 F (38 C) OR HIGHER *CHILLS OR SWEATING *NAUSEA AND VOMITING THAT IS NOT CONTROLLED WITH YOUR NAUSEA MEDICATION *UNUSUAL SHORTNESS OF BREATH *UNUSUAL BRUISING OR BLEEDING *URINARY PROBLEMS (pain or burning when urinating, or frequent urination) *BOWEL PROBLEMS (unusual diarrhea, constipation, pain near the anus) TENDERNESS IN MOUTH AND THROAT WITH OR WITHOUT PRESENCE OF ULCERS (sore throat, sores in mouth, or a toothache) UNUSUAL RASH, SWELLING OR PAIN  UNUSUAL VAGINAL DISCHARGE OR ITCHING   Items with * indicate a potential emergency and should be followed up as soon as possible or go to the Emergency Department if any problems should occur.  Please show the CHEMOTHERAPY ALERT CARD or IMMUNOTHERAPY ALERT CARD at  check-in to the Emergency Department and triage nurse. Should you have questions after your visit or need to cancel or reschedule your appointment, please contact [facility]  [phone] and follow the prompts.  Office hours are 8:00 a.m. to 4:30 p.m. Monday - Friday. Please note that voicemails left after 4:00 p.m. may not be returned until the following business day.  We are closed weekends and major holidays. You have access to a nurse at all times for urgent questions. Please call the main number to the clinic [phone] and follow the prompts.  For any non-urgent questions, you may also contact your provider using MyChart. We now offer e-Visits for anyone 69 and older to request care online for non-urgent symptoms. For details visit [email or web address].   Also download the MyChart app! Go to the app store, search "MyChart", open the app, select [facility], and log in with your MyChart username and password.  Due to Covid, a mask is required upon entering the hospital/clinic. If you do not have a mask, one will be given to you upon arrival. For doctor visits, patients may have 1 support person aged 20 or older with them. For treatment visits, patients cannot have anyone with them due to current Covid guidelines and our immunocompromised population.

## 2021-04-08 LAB — PROTEIN ELECTROPHORESIS, SERUM
A/G Ratio: 1.2 (ref 0.7–1.7)
Albumin ELP: 3.7 g/dL (ref 2.9–4.4)
Alpha-1-Globulin: 0.3 g/dL (ref 0.0–0.4)
Alpha-2-Globulin: 0.9 g/dL (ref 0.4–1.0)
Beta Globulin: 0.9 g/dL (ref 0.7–1.3)
Gamma Globulin: 1.1 g/dL (ref 0.4–1.8)
Globulin, Total: 3.1 g/dL (ref 2.2–3.9)
Total Protein ELP: 6.8 g/dL (ref 6.0–8.5)

## 2021-04-08 LAB — KAPPA/LAMBDA LIGHT CHAINS
Kappa free light chain: 40.9 mg/L — ABNORMAL HIGH (ref 3.3–19.4)
Kappa, lambda light chain ratio: 1.76 — ABNORMAL HIGH (ref 0.26–1.65)
Lambda free light chains: 23.3 mg/L (ref 5.7–26.3)

## 2021-04-08 LAB — IGG, IGA, IGM
IgA: 229 mg/dL (ref 61–437)
IgG (Immunoglobin G), Serum: 1090 mg/dL (ref 603–1613)
IgM (Immunoglobulin M), Srm: 49 mg/dL (ref 15–143)

## 2021-04-11 ENCOUNTER — Encounter: Payer: Self-pay | Admitting: Podiatry

## 2021-04-11 ENCOUNTER — Ambulatory Visit (INDEPENDENT_AMBULATORY_CARE_PROVIDER_SITE_OTHER): Payer: 59 | Admitting: Podiatry

## 2021-04-11 ENCOUNTER — Other Ambulatory Visit: Payer: Self-pay

## 2021-04-11 DIAGNOSIS — E1151 Type 2 diabetes mellitus with diabetic peripheral angiopathy without gangrene: Secondary | ICD-10-CM

## 2021-04-11 DIAGNOSIS — L853 Xerosis cutis: Secondary | ICD-10-CM

## 2021-04-11 MED ORDER — AMMONIUM LACTATE 12 % EX LOTN: 1.0000 "application " | TOPICAL_LOTION | CUTANEOUS | 0 refills | Status: DC | PRN

## 2021-04-11 NOTE — Progress Notes (Signed)
Subjective:  Patient ID: [name], male    DOB: 1950-12-16,  MRN: [id number]  Chief Complaint  Patient presents with   Callouses    Right 5th toe causing pain     71 y.o. male presents with the above complaint.  Patient presents with right heel xerosis/dry skin.  Patient is a diabetic with last A1c of 5.2.  He states that he is painful to sometimes walk on it.  Has thick callus back.  He has been using over-the-counter cream and lotion which none of that has helped.  He states he also greases it but none of it has helped.  He would like to discuss treatment options for it.  There is no skin fissure or open wounds.  He denies any other acute complaints.   Review of Systems: Negative except as noted in the HPI. Denies N/V/F/Ch.  Past Medical History:  Diagnosis Date   Anemia    Anemia of renal disease 03/12/2011   Anemia, iron deficiency 03/12/2011   Anxiety    Anxiety disorder    Arthralgia of multiple joints 12/31/2010   Arthritis    Constipation    Diabetes mellitus without complication (HCC)    Diabetic neuropathy (HCC)    Diverticulosis    Epidermoid cyst of skin of chest 08/29/2018   Fibromyalgia    GERD (gastroesophageal reflux disease)    Headache    Hemorrhoids    Herpes zoster    History of COVID-19 04/03/2019   Hyperlipidemia    Hypertension    Hypotestosteronism 03/17/2012   Multiple myeloma    Multiple myeloma ([facility])    Myeloma ([facility]) 12/31/2010   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 , Disp: , Rfl:    CARAFATE 1 GM/10ML suspension, Take 1 g by mouth 4 (four) times daily. ,  Disp: , Rfl:    carvedilol (COREG) 25 MG tablet, Take 25 mg by mouth 2 (two) times daily., Disp: , Rfl:    COVID-19 mRNA bivalent vaccine, Pfizer, (PFIZER COVID-19 VAC BIVALENT) injection, Inject into the muscle., Disp: 0.3 mL, Rfl: 0   [employer]S Fiber Gummies 2 g CHEW, Chew by mouth daily., Disp: , Rfl:    dicyclomine (BENTYL) 10 MG capsule, TAKE 1 CAPSULE (10 MG TOTAL) BY MOUTH 3 (THREE) TIMES DAILY BEFORE MEALS., Disp: 270 capsule, Rfl: 1   dronabinol (MARINOL) 2.5 MG capsule, Take 2.5 mg by mouth 2 (two) times daily before lunch and supper., Disp: , Rfl:    gabapentin (NEURONTIN) 300 MG capsule, TAKE 1 CAPSULE BY MOUTH THREE TIMES A DAY (Patient taking differently: Take 300 mg by mouth 3 (three) times daily.), Disp: 270 capsule, Rfl: 1   glimepiride (AMARYL) 2 MG tablet, Take 2 mg by mouth daily., Disp: , Rfl:    hydrocortisone 2.5 % cream, Apply topically., Disp: , Rfl:    lactulose (CHRONULAC) 10 GM/15ML solution, Take 30 mLs (20 g total) by mouth daily., Disp: 946 mL, Rfl: 1   latanoprost (XALATAN) 0.005 % ophthalmic solution, 1 drop at bedtime.,  Disp: , Rfl:    linaclotide (LINZESS) 145 MCG CAPS capsule, Take by mouth daily as needed., Disp: , Rfl:    losartan (COZAAR) 50 MG tablet, Take 50 mg by mouth daily., Disp: , Rfl:    LUMIGAN 0.01 % SOLN, Place 1 drop into both eyes 3 (three) times daily., Disp: , Rfl:    metFORMIN (GLUCOPHAGE) 500 MG tablet, Take 500 mg by mouth 2 (two) times daily., Disp: , Rfl:    metoprolol succinate (TOPROL-XL) 25 MG 24 hr tablet, Take 25 mg by mouth daily., Disp: , Rfl:    mirabegron ER (MYRBETRIQ) 25 MG TB24 tablet, Take 20 mg by mouth daily., Disp: , Rfl:    morphine (MS CONTIN) 30 MG 12 hr tablet, Take 30 mg by mouth 2 (two) times daily., Disp: , Rfl:    omeprazole (PRILOSEC) 40 MG capsule, TAKE 1 CAPSULE BY MOUTH TWICE A DAY, Disp: 180 capsule, Rfl: 0   ondansetron (ZOFRAN) 8 MG tablet, TAKE 1 TABLET(8 MG) BY MOUTH TWICE DAILY AS NEEDED FOR NAUSEA OR VOMITING,  Disp: 30 tablet, Rfl: 3   oxyCODONE (ROXICODONE) 5 MG immediate release tablet, 1 tab PO q6 hours prn pain, Disp: 15 tablet, Rfl: 0   oxyCODONE-acetaminophen (PERCOCET) 10-325 MG tablet, Take 1 tablet by mouth every 6 (six) hours as needed for pain., Disp: , Rfl:    pioglitazone (ACTOS) 30 MG tablet, Take 30 mg by mouth daily., Disp: , Rfl:    prochlorperazine (COMPAZINE) 10 MG tablet, take 1 tablet by mouth every 6 hours if needed for nausea and vomiting, Disp: 30 tablet, Rfl: 1   RESTASIS 0.05 % ophthalmic emulsion, Place 1 drop into both eyes daily. , Disp: , Rfl:    SUMAtriptan (IMITREX) 100 MG tablet, Take 100 mg by mouth daily., Disp: , Rfl:    tadalafil (CIALIS) 10 MG tablet, Take by mouth., Disp: , Rfl:    topiramate (TOPAMAX) 25 MG capsule, Take 25 mg by mouth 2 (two) times daily., Disp: , Rfl:    torsemide (DEMADEX) 20 MG tablet, Take 40 mg by mouth daily. , Disp: , Rfl:    TRULICITY 7.59 FM/3.8GY SOPN, Inject 0.75 mg into the skin every Saturday. , Disp: , Rfl:    Vitamin D, Ergocalciferol, (DRISDOL) 50000 UNITS CAPS capsule, Take 50,000 Units by mouth every Saturday. , Disp: , Rfl:  No current facility-administered medications for this visit.  Facility-Administered Medications Ordered in Other Visits:    0.9 %  sodium chloride infusion, , Intravenous, Continuous, Celso Amy, NP, Stopped at 10/20/19 1037   furosemide (LASIX) injection 40 mg, 40 mg, Intravenous, Once, Ennever, Rudell Cobb, MD   HYDROmorphone (DILAUDID) 1 MG/ML injection, , , ,    morphine 4 MG/ML injection 4 mg, 4 mg, Intravenous, Q4H PRN, Volanda Napoleon, MD   sodium chloride flush (NS) 0.9 % injection 10 mL, 10 mL, Intravenous, PRN, Celso Amy, NP, 10 mL at 10/20/19 1042  Social History   Tobacco Use  Smoking Status Never  Smokeless Tobacco Never    Allergies  Allergen Reactions   Iodinated Contrast Media Rash    Patient states he was instructed not to take IV contrast.  In 2008 he had an unknown  reaction, and was told not to take it again.  He was also told not to take it due to his kidneys.   Iodine Anxiety, Rash and Other (See Comments)    Didn't feel right "instructed not to take per MD--something with his  port"    Objective:  There were no vitals filed for this visit. There is no height or weight on file to calculate BMI. Constitutional Well developed. Well nourished.  Vascular Dorsalis pedis pulses palpable bilaterally. Posterior tibial pulses palpable bilaterally. Capillary refill normal to all digits.  No cyanosis or clubbing noted. Pedal hair growth normal.  Neurologic Normal speech. Oriented to person, place, and time. Epicritic sensation to light touch grossly present bilaterally.  Dermatologic Dry skin with xerosis noted to bilateral heels right worse than left side.  No skin fissure noted no open wound noted.  Orthopedic: Normal joint ROM without pain or crepitus bilaterally. No visible deformities. No bony tenderness.   Radiographs: None Assessment:  No diagnosis found. Plan:  Patient was evaluated and treated and all questions answered.  Bilateral heel xerosis -I explained to the patient the etiology of xerosis and various treatment options were extensively discussed.  I explained to the patient the importance of maintaining moisturization of the skin with application of over-the-counter lotion such as Eucerin or Luciderm.  Given that he has failed over-the-counter medication I believe patient will benefit from ammonium lactate.  Ammonium lactate was sent to the pharmacy.   No follow-ups on file.

## 2021-05-05 ENCOUNTER — Inpatient Hospital Stay: Payer: 59

## 2021-05-05 ENCOUNTER — Other Ambulatory Visit: Payer: Self-pay

## 2021-05-05 ENCOUNTER — Other Ambulatory Visit: Payer: Self-pay | Admitting: *Deleted

## 2021-05-05 ENCOUNTER — Encounter: Payer: Self-pay | Admitting: Hematology & Oncology

## 2021-05-05 ENCOUNTER — Inpatient Hospital Stay (HOSPITAL_BASED_OUTPATIENT_CLINIC_OR_DEPARTMENT_OTHER): Payer: 59 | Admitting: Hematology & Oncology

## 2021-05-05 ENCOUNTER — Inpatient Hospital Stay: Payer: 59 | Attending: Hematology & Oncology

## 2021-05-05 VITALS — BP 149/76 | HR 80 | Temp 98.2°F | Resp 20 | Wt 183.1 lb

## 2021-05-05 VITALS — BP 154/84

## 2021-05-05 DIAGNOSIS — E291 Testicular hypofunction: Secondary | ICD-10-CM | POA: Insufficient documentation

## 2021-05-05 DIAGNOSIS — M4712 Other spondylosis with myelopathy, cervical region: Secondary | ICD-10-CM

## 2021-05-05 DIAGNOSIS — D631 Anemia in chronic kidney disease: Secondary | ICD-10-CM

## 2021-05-05 DIAGNOSIS — N189 Chronic kidney disease, unspecified: Secondary | ICD-10-CM | POA: Diagnosis not present

## 2021-05-05 DIAGNOSIS — Z5111 Encounter for antineoplastic chemotherapy: Secondary | ICD-10-CM | POA: Diagnosis present

## 2021-05-05 DIAGNOSIS — C9000 Multiple myeloma not having achieved remission: Secondary | ICD-10-CM

## 2021-05-05 DIAGNOSIS — D509 Iron deficiency anemia, unspecified: Secondary | ICD-10-CM | POA: Diagnosis not present

## 2021-05-05 DIAGNOSIS — D500 Iron deficiency anemia secondary to blood loss (chronic): Secondary | ICD-10-CM

## 2021-05-05 DIAGNOSIS — Z5112 Encounter for antineoplastic immunotherapy: Secondary | ICD-10-CM | POA: Diagnosis present

## 2021-05-05 DIAGNOSIS — E349 Endocrine disorder, unspecified: Secondary | ICD-10-CM

## 2021-05-05 DIAGNOSIS — Z299 Encounter for prophylactic measures, unspecified: Secondary | ICD-10-CM

## 2021-05-05 LAB — LACTATE DEHYDROGENASE: LDH: 202 U/L — ABNORMAL HIGH (ref 98–192)

## 2021-05-05 LAB — CMP (CANCER CENTER ONLY)
ALT: 16 U/L (ref 0–44)
AST: 24 U/L (ref 15–41)
Albumin: 4.2 g/dL (ref 3.5–5.0)
Alkaline Phosphatase: 70 U/L (ref 38–126)
Anion gap: 10 (ref 5–15)
BUN: 33 mg/dL — ABNORMAL HIGH (ref 8–23)
CO2: 30 mmol/L (ref 22–32)
Calcium: 9.2 mg/dL (ref 8.9–10.3)
Chloride: 99 mmol/L (ref 98–111)
Creatinine: 2.3 mg/dL — ABNORMAL HIGH (ref 0.61–1.24)
GFR, Estimated: 30 mL/min — ABNORMAL LOW (ref >60)
Glucose, Bld: 101 mg/dL — ABNORMAL HIGH (ref 70–99)
Potassium: 3.9 mmol/L (ref 3.5–5.1)
Sodium: 139 mmol/L (ref 135–145)
Total Bilirubin: 0.5 mg/dL (ref 0.3–1.2)
Total Protein: 7.4 g/dL (ref 6.5–8.1)

## 2021-05-05 LAB — CBC WITH DIFFERENTIAL (CANCER CENTER ONLY)
Abs Immature Granulocytes: 0.03 10*3/uL (ref 0.00–0.07)
Basophils Absolute: 0.1 10*3/uL (ref 0.0–0.1)
Basophils Relative: 2 %
Eosinophils Absolute: 0.1 10*3/uL (ref 0.0–0.5)
Eosinophils Relative: 3 %
HCT: 33.2 % — ABNORMAL LOW (ref 39.0–52.0)
Hemoglobin: 10.5 g/dL — ABNORMAL LOW (ref 13.0–17.0)
Immature Granulocytes: 1 %
Lymphocytes Relative: 18 %
Lymphs Abs: 0.7 10*3/uL (ref 0.7–4.0)
MCH: 25.2 pg — ABNORMAL LOW (ref 26.0–34.0)
MCHC: 31.6 g/dL (ref 30.0–36.0)
MCV: 79.6 fL — ABNORMAL LOW (ref 80.0–100.0)
Monocytes Absolute: 0.7 10*3/uL (ref 0.1–1.0)
Monocytes Relative: 16 %
Neutro Abs: 2.5 10*3/uL (ref 1.7–7.7)
Neutrophils Relative %: 60 %
Platelet Count: 182 10*3/uL (ref 150–400)
RBC: 4.17 MIL/uL — ABNORMAL LOW (ref 4.22–5.81)
RDW: 17.1 % — ABNORMAL HIGH (ref 11.5–15.5)
WBC Count: 4.1 10*3/uL (ref 4.0–10.5)
nRBC: 0 % (ref 0.0–0.2)

## 2021-05-05 LAB — IRON AND IRON BINDING CAPACITY (CC-WL,HP ONLY)
Iron: 81 ug/dL (ref 45–182)
Saturation Ratios: 31 % (ref 17.9–39.5)
TIBC: 258 ug/dL (ref 250–450)
UIBC: 177 ug/dL (ref 117–376)

## 2021-05-05 LAB — FERRITIN: Ferritin: 1565 ng/mL — ABNORMAL HIGH (ref 24–336)

## 2021-05-05 MED ORDER — SODIUM CHLORIDE 0.9 % IV SOLN
10.0000 mg | Freq: Once | INTRAVENOUS | Status: AC
  Administered 2021-05-05: 10 mg via INTRAVENOUS
  Filled 2021-05-05: qty 10

## 2021-05-05 MED ORDER — HYDROMORPHONE HCL 1 MG/ML IJ SOLN
2.0000 mg | Freq: Once | INTRAMUSCULAR | Status: AC
  Administered 2021-05-05: 2 mg via INTRAVENOUS
  Filled 2021-05-05: qty 2

## 2021-05-05 MED ORDER — LORAZEPAM 2 MG/ML IJ SOLN
1.0000 mg | Freq: Once | INTRAMUSCULAR | Status: AC
  Administered 2021-05-05: 1 mg via INTRAVENOUS
  Filled 2021-05-05: qty 1

## 2021-05-05 MED ORDER — SODIUM CHLORIDE 0.9 % IV SOLN: Freq: Once | INTRAVENOUS | Status: DC

## 2021-05-05 MED ORDER — DEXTROSE 5 % IV SOLN
29.0000 mg/m2 | Freq: Once | INTRAVENOUS | Status: AC
  Administered 2021-05-05: 60 mg via INTRAVENOUS
  Filled 2021-05-05: qty 30

## 2021-05-05 MED ORDER — PALONOSETRON HCL INJECTION 0.25 MG/5ML
0.2500 mg | Freq: Once | INTRAVENOUS | Status: AC
  Administered 2021-05-05: 0.25 mg via INTRAVENOUS
  Filled 2021-05-05: qty 5

## 2021-05-05 MED ORDER — CYANOCOBALAMIN 1000 MCG/ML IJ SOLN
1000.0000 ug | Freq: Once | INTRAMUSCULAR | Status: AC
  Administered 2021-05-05: 1000 ug via INTRAMUSCULAR
  Filled 2021-05-05: qty 1

## 2021-05-05 MED ORDER — PROCHLORPERAZINE MALEATE 10 MG PO TABS: ORAL_TABLET | ORAL | 1 refills | Status: DC

## 2021-05-05 MED ORDER — SODIUM CHLORIDE 0.9 % IV SOLN: Freq: Once | INTRAVENOUS | Status: AC

## 2021-05-05 MED ORDER — HEPARIN SOD (PORK) LOCK FLUSH 100 UNIT/ML IV SOLN
500.0000 [IU] | Freq: Once | INTRAVENOUS | Status: AC | PRN
  Administered 2021-05-05: 500 [IU]

## 2021-05-05 MED ORDER — TESTOSTERONE CYPIONATE 200 MG/ML IM SOLN
400.0000 mg | Freq: Once | INTRAMUSCULAR | Status: AC
  Administered 2021-05-05: 400 mg via INTRAMUSCULAR
  Filled 2021-05-05: qty 2

## 2021-05-05 MED ORDER — SODIUM CHLORIDE 0.9 % IV SOLN
60.0000 mg | Freq: Once | INTRAVENOUS | Status: AC
  Administered 2021-05-05: 60 mg via INTRAVENOUS
  Filled 2021-05-05: qty 10

## 2021-05-05 MED ORDER — SODIUM CHLORIDE 0.9 % IV SOLN
300.0000 mg/m2 | Freq: Once | INTRAVENOUS | Status: AC
  Administered 2021-05-05: 620 mg via INTRAVENOUS
  Filled 2021-05-05: qty 31

## 2021-05-05 MED ORDER — SODIUM CHLORIDE 0.9 % IV SOLN
40.0000 mg | Freq: Once | INTRAVENOUS | Status: AC
  Administered 2021-05-05: 40 mg via INTRAVENOUS
  Filled 2021-05-05: qty 4

## 2021-05-05 MED ORDER — SODIUM CHLORIDE 0.9% FLUSH
10.0000 mL | INTRAVENOUS | Status: DC | PRN
  Administered 2021-05-05: 10 mL

## 2021-05-05 NOTE — Progress Notes (Signed)
Hematology and Oncology Follow Up Visit  [name] [id number] 1950-05-31 71 y.o. 05/05/2021   Principle Diagnosis:  IgG kappa myeloma Anemia secondary to renal insufficiency Intermittent iron - deficiency anemia Hypotestosteronemia   Current Therapy:        Aredia 60 mg IV q  month -- Retacrit SQ as needed for hemoglobin less than 10 DepoTestosterone 400 mg q 4 weeks IV iron as indicated Kyprolis/Cytoxan - s/p cycle #32   Interim History:  [name] is here today for follow-up and treatment.  He is doing pretty well.  The change in weather definitely affects his arthritis and fibromyalgia.  Over last saw him, his monoclonal spike was not observed.  There was no increase in the IgG level.  His IgG level was 1090 mg/dL.  The Kappa light chain was 4.1 mg/dL.  His blood sugars have been doing pretty well.  He is trying to monitor his diabetes.  He has had no problems with bowels or bladder.  He has had no bleeding.  He has had no rashes.  He has had no leg swelling.  He does have some neuropathy.  His last iron studies done back in February showed a ferritin of 1500 with iron saturation of 32%.  Overall, his performance status is ECOG 1.    Medications:  Allergies as of 05/05/2021       Reactions   Iodinated Contrast Media Rash   Patient states he was instructed not to take IV contrast.  In 2008 he had an unknown reaction, and was told not to take it again.  He was also told not to take it due to his kidneys.   Iodine Anxiety, Rash, Other (See Comments)   Didn't feel right "instructed not to take per MD--something with his port"        Medication List        Accurate as of May 05, 2021  8:39 AM. If you have any questions, ask your nurse or doctor.          STOP taking these medications    metFORMIN 500 MG tablet Commonly known as: GLUCOPHAGE Stopped by: Volanda Napoleon, MD   Pfizer COVID-19 Vac Bivalent injection Generic drug: COVID-19 mRNA bivalent  vaccine Therapist, music) Stopped by: Volanda Napoleon, MD       TAKE these medications    amLODipine 10 MG tablet Commonly known as: NORVASC Take 10 mg by mouth daily.   ammonium lactate 12 % lotion Commonly known as: AmLactin Apply 1 application topically as needed for dry skin.   atorvastatin 40 MG tablet Commonly known as: LIPITOR Take 40 mg by mouth daily at 6 PM.   budesonide-formoterol 160-4.5 MCG/ACT inhaler Commonly known as: SYMBICORT SMARTSIG:2 Puff(s) By Mouth Twice Daily   Carafate 1 GM/10ML suspension Generic drug: sucralfate Take 1 g by mouth 4 (four) times daily.   carvedilol 25 MG tablet Commonly known as: COREG Take 25 mg by mouth 2 (two) times daily.   chlorpheniramine-HYDROcodone 10-8 MG/5ML SMARTSIG:5 Milliliter(s) By Mouth Every 12 Hours   [employer] Fiber Gummies 2 g Chew Chew by mouth daily.   diazepam 5 MG tablet Commonly known as: VALIUM Take by mouth at bedtime as needed.   dicyclomine 10 MG capsule Commonly known as: BENTYL TAKE 1 CAPSULE (10 MG TOTAL) BY MOUTH 3 (THREE) TIMES DAILY BEFORE MEALS.   dronabinol 2.5 MG capsule Commonly known as: MARINOL Take 2.5 mg by mouth 2 (two) times daily before lunch and supper.  gabapentin 300 MG capsule Commonly known as: NEURONTIN TAKE 1 CAPSULE BY MOUTH THREE TIMES A DAY What changed:  how much to take how to take this when to take this additional instructions   glimepiride 2 MG tablet Commonly known as: AMARYL Take 2 mg by mouth daily.   hydrocortisone 2.5 % cream Apply topically.   lactulose 10 GM/15ML solution Commonly known as: CHRONULAC Take 30 mLs (20 g total) by mouth daily.   latanoprost 0.005 % ophthalmic solution Commonly known as: XALATAN Place 1 drop into both eyes at bedtime.   linaclotide 145 MCG Caps capsule Commonly known as: LINZESS Take by mouth daily as needed.   losartan 50 MG tablet Commonly known as: COZAAR Take 50 mg by mouth daily.   Lumigan 0.01 %  Soln Generic drug: bimatoprost Place 1 drop into both eyes 3 (three) times daily.   mesalamine 1.2 g EC tablet Commonly known as: LIALDA Take by mouth daily with breakfast.   metoprolol succinate 25 MG 24 hr tablet Commonly known as: TOPROL-XL Take 25 mg by mouth daily.   mirabegron ER 25 MG Tb24 tablet Commonly known as: MYRBETRIQ Take 20 mg by mouth daily.   morphine 30 MG 12 hr tablet Commonly known as: MS CONTIN Take 30 mg by mouth 2 (two) times daily.   omeprazole 40 MG capsule Commonly known as: PRILOSEC TAKE 1 CAPSULE BY MOUTH TWICE A DAY   ondansetron 8 MG tablet Commonly known as: ZOFRAN TAKE 1 TABLET(8 MG) BY MOUTH TWICE DAILY AS NEEDED FOR NAUSEA OR VOMITING   oxyCODONE 5 MG immediate release tablet Commonly known as: Roxicodone 1 tab PO q6 hours prn pain   oxyCODONE-acetaminophen 10-325 MG tablet Commonly known as: PERCOCET Take 1 tablet by mouth every 6 (six) hours as needed for pain.   pioglitazone 30 MG tablet Commonly known as: ACTOS Take 30 mg by mouth daily.   prochlorperazine 10 MG tablet Commonly known as: COMPAZINE take 1 tablet by mouth every 6 hours if needed for nausea and vomiting   Restasis 0.05 % ophthalmic emulsion Generic drug: cycloSPORINE Place 1 drop into both eyes daily.   SUMAtriptan 100 MG tablet Commonly known as: IMITREX Take 100 mg by mouth daily.   tadalafil 10 MG tablet Commonly known as: CIALIS Take by mouth.   topiramate 25 MG capsule Commonly known as: TOPAMAX Take 25 mg by mouth 2 (two) times daily.   torsemide 20 MG tablet Commonly known as: DEMADEX Take 40 mg by mouth daily.   Trulicity 2.68 TM/1.9QQ Sopn Generic drug: Dulaglutide Inject 0.75 mg into the skin every Saturday.   Vitamin D (Ergocalciferol) 1.25 MG (50000 UNIT) Caps capsule Commonly known as: DRISDOL Take 50,000 Units by mouth every Saturday.        Allergies:  Allergies  Allergen Reactions   Iodinated Contrast Media Rash     Patient states he was instructed not to take IV contrast.  In 2008 he had an unknown reaction, and was told not to take it again.  He was also told not to take it due to his kidneys.   Iodine Anxiety, Rash and Other (See Comments)    Didn't feel right "instructed not to take per MD--something with his port"     Past Medical History, Surgical history, Social history, and Family History were reviewed and updated.  Review of Systems: Review of Systems  Constitutional:  Positive for malaise/fatigue.  HENT: Negative.    Eyes: Negative.   Respiratory: Negative.  Cardiovascular: Negative.   Gastrointestinal: Negative.   Genitourinary: Negative.   Musculoskeletal:  Positive for back pain and joint pain.  Skin: Negative.   Neurological: Negative.   Endo/Heme/Allergies: Negative.   Psychiatric/Behavioral: Negative.      Physical Exam:  weight is 183 lb 1.9 oz (83.1 kg). His oral temperature is 98.2 F (36.8 C). His blood pressure is 149/76 (abnormal) and his pulse is 80. His respiration is 20 and oxygen saturation is 99%.   Wt Readings from Last 3 Encounters:  05/05/21 183 lb 1.9 oz (83.1 kg)  04/07/21 183 lb 4 oz (83.1 kg)  03/07/21 189 lb (85.7 kg)   Physical Exam Vitals reviewed.  HENT:     Head: Normocephalic and atraumatic.  Eyes:     Pupils: Pupils are equal, round, and reactive to light.  Cardiovascular:     Rate and Rhythm: Normal rate and regular rhythm.     Heart sounds: Normal heart sounds.  Pulmonary:     Effort: Pulmonary effort is normal.     Breath sounds: Normal breath sounds.  Abdominal:     General: Bowel sounds are normal.     Palpations: Abdomen is soft.  Musculoskeletal:        General: No tenderness or deformity. Normal range of motion.     Cervical back: Normal range of motion.  Lymphadenopathy:     Cervical: No cervical adenopathy.  Skin:    General: Skin is warm and dry.     Findings: No erythema or rash.  Neurological:     Mental Status: He  is alert and oriented to person, place, and time.  Psychiatric:        Behavior: Behavior normal.        Thought Content: Thought content normal.        Judgment: Judgment normal.    Lab Results  Component Value Date   WBC 4.1 05/05/2021   HGB 10.5 (L) 05/05/2021   HCT 33.2 (L) 05/05/2021   MCV 79.6 (L) 05/05/2021   PLT 182 05/05/2021   Lab Results  Component Value Date   FERRITIN 1,525 (H) 04/07/2021   IRON 80 04/07/2021   TIBC 249 (L) 04/07/2021   UIBC 169 04/07/2021   IRONPCTSAT 32 04/07/2021   Lab Results  Component Value Date   RETICCTPCT 0.7 12/10/2020   RBC 4.17 (L) 05/05/2021   RETICCTABS 27.5 03/21/2014   Lab Results  Component Value Date   KPAFRELGTCHN 40.9 (H) 04/07/2021   LAMBDASER 23.3 04/07/2021   KAPLAMBRATIO 1.76 (H) 04/07/2021   Lab Results  Component Value Date   IGGSERUM 1,090 04/07/2021   IGA 229 04/07/2021   IGMSERUM 49 04/07/2021   Lab Results  Component Value Date   TOTALPROTELP 6.8 04/07/2021   ALBUMINELP 3.7 04/07/2021   A1GS 0.3 04/07/2021   A2GS 0.9 04/07/2021   BETS 0.9 04/07/2021   BETA2SER 0.5 11/07/2014   GAMS 1.1 04/07/2021   MSPIKE Not Observed 04/07/2021   SPEI Comment 04/07/2021     Chemistry      Component Value Date/Time   NA 138 04/07/2021 0815   NA 143 02/22/2017 0744   K 4.2 04/07/2021 0815   K 4.0 02/22/2017 0744   CL 104 04/07/2021 0815   CL 106 02/22/2017 0744   CO2 26 04/07/2021 0815   CO2 27 02/22/2017 0744   BUN 20 04/07/2021 0815   BUN 26 (H) 02/22/2017 0744   CREATININE 2.18 (H) 04/07/2021 0815   CREATININE 2.3 (H) 02/22/2017 7262  Component Value Date/Time   CALCIUM 8.8 (L) 04/07/2021 0815   CALCIUM 9.3 02/22/2017 0744   ALKPHOS 70 04/07/2021 0815   ALKPHOS 80 02/22/2017 0744   AST 19 04/07/2021 0815   ALT 11 04/07/2021 0815   ALT 22 02/22/2017 0744   BILITOT 0.7 04/07/2021 0815       Impression and Plan: [name] is a very pleasant 71 yo African American gentleman with IgG kappa  myeloma.  We have been following this now for several years.  He has done incredibly well with this.  I still think that his long-term problem will be the diabetes.  He does have chronic renal insufficiency.  His labs for his renal function are not all that bad.  Hopefully with the warmer spring weather coming, his arthritis might be as bad.  He gets treated monthly at this point.  Again this is a good interval for him.  We will have him come back to see [place] in 1 month.   Volanda Napoleon, MD 3/13/20238:39 AM

## 2021-05-05 NOTE — Progress Notes (Signed)
Pt with vomiting x2 in the last thirty minutes.  Dr. [name] notified and order received for pt to have Pepcid 40 mg IV and Ativan 1 mg IV now.  ?

## 2021-05-05 NOTE — Patient Instructions (Signed)

## 2021-05-05 NOTE — Progress Notes (Signed)
OK to treat with creat-2.30 per order of Dr. [name]. ?

## 2021-05-05 NOTE — Patient Instructions (Signed)
[facility]  Discharge Instructions: ?Thank you for choosing [facility] to provide your oncology and hematology care.  ? ?If you have a lab appointment with the [facility], please go directly to the [facility] and check in at the registration area. ? ?Wear comfortable clothing and clothing appropriate for easy access to any Portacath or PICC line.  ? ?We strive to give you quality time with your provider. You may need to reschedule your appointment if you arrive late (15 or more minutes).  Arriving late affects you and other patients whose appointments are after yours.  Also, if you miss three or more appointments without notifying the office, you may be dismissed from the clinic at the provider?s discretion.    ?  ?For prescription refill requests, have your pharmacy contact our office and allow 72 hours for refills to be completed.   ? ?Today you received the following chemotherapy and/or immunotherapy agents: Cytoxan and Kyprolis    ?  ?To help prevent nausea and vomiting after your treatment, we encourage you to take your nausea medication as directed. ? ?BELOW ARE SYMPTOMS THAT SHOULD BE REPORTED IMMEDIATELY: ?*FEVER GREATER THAN 100.4 F (38 ?C) OR HIGHER ?*CHILLS OR SWEATING ?*NAUSEA AND VOMITING THAT IS NOT CONTROLLED WITH YOUR NAUSEA MEDICATION ?*UNUSUAL SHORTNESS OF BREATH ?*UNUSUAL BRUISING OR BLEEDING ?*URINARY PROBLEMS (pain or burning when urinating, or frequent urination) ?*BOWEL PROBLEMS (unusual diarrhea, constipation, pain near the anus) ?TENDERNESS IN MOUTH AND THROAT WITH OR WITHOUT PRESENCE OF ULCERS (sore throat, sores in mouth, or a toothache) ?UNUSUAL RASH, SWELLING OR PAIN  ?UNUSUAL VAGINAL DISCHARGE OR ITCHING  ? ?Items with * indicate a potential emergency and should be followed up as soon as possible or go to the Emergency Department if any problems should occur. ? ?Please show the CHEMOTHERAPY ALERT CARD or IMMUNOTHERAPY ALERT CARD at  check-in to the Emergency Department and triage nurse. ?Should you have questions after your visit or need to cancel or reschedule your appointment, please contact [facility]  [phone] and follow the prompts.  Office hours are 8:00 a.m. to 4:30 p.m. Monday - Friday. Please note that voicemails left after 4:00 p.m. may not be returned until the following business day.  We are closed weekends and major holidays. You have access to a nurse at all times for urgent questions. Please call the main number to the clinic [phone] and follow the prompts. ? ?For any non-urgent questions, you may also contact your provider using MyChart. We now offer e-Visits for anyone 41 and older to request care online for non-urgent symptoms. For details visit [email or web address]. ?  ?Also download the MyChart app! Go to the app store, search "MyChart", open the app, select [facility], and log in with your MyChart username and password. ? ?Due to Covid, a mask is required upon entering the hospital/clinic. If you do not have a mask, one will be given to you upon arrival. For doctor visits, patients may have 1 support person aged 48 or older with them. For treatment visits, patients cannot have anyone with them due to current Covid guidelines and our immunocompromised population.  ?

## 2021-05-05 NOTE — Progress Notes (Signed)
Pt received Ativan at 1432. Pt is being d/c with no complaints. Vital signs stable. Pt declined calling for transportation.  ?

## 2021-05-06 LAB — IGG, IGA, IGM
IgA: 259 mg/dL (ref 61–437)
IgG (Immunoglobin G), Serum: 1053 mg/dL (ref 603–1613)
IgM (Immunoglobulin M), Srm: 43 mg/dL (ref 15–143)

## 2021-05-06 LAB — KAPPA/LAMBDA LIGHT CHAINS
Kappa free light chain: 50.8 mg/L — ABNORMAL HIGH (ref 3.3–19.4)
Kappa, lambda light chain ratio: 2.2 — ABNORMAL HIGH (ref 0.26–1.65)
Lambda free light chains: 23.1 mg/L (ref 5.7–26.3)

## 2021-05-06 LAB — TESTOSTERONE: Testosterone: 506 ng/dL (ref 264–916)

## 2021-05-14 LAB — PROTEIN ELECTROPHORESIS, SERUM, WITH REFLEX
A/G Ratio: 1.2 (ref 0.7–1.7)
Albumin ELP: 3.7 g/dL (ref 2.9–4.4)
Alpha-1-Globulin: 0.2 g/dL (ref 0.0–0.4)
Alpha-2-Globulin: 0.9 g/dL (ref 0.4–1.0)
Beta Globulin: 0.9 g/dL (ref 0.7–1.3)
Gamma Globulin: 1 g/dL (ref 0.4–1.8)
Globulin, Total: 3 g/dL (ref 2.2–3.9)
Total Protein ELP: 6.7 g/dL (ref 6.0–8.5)

## 2021-05-16 ENCOUNTER — Other Ambulatory Visit: Payer: Self-pay

## 2021-05-16 ENCOUNTER — Encounter (HOSPITAL_COMMUNITY): Payer: Self-pay | Admitting: Emergency Medicine

## 2021-05-16 ENCOUNTER — Encounter (HOSPITAL_COMMUNITY)
Admission: EM | Disposition: A | Discharge status: Home / Self Care | Payer: Self-pay | Source: Home / Self Care | Attending: Emergency Medicine

## 2021-05-16 ENCOUNTER — Emergency Department (EMERGENCY_DEPARTMENT_HOSPITAL): Payer: 59 | Admitting: Anesthesiology

## 2021-05-16 ENCOUNTER — Emergency Department (HOSPITAL_COMMUNITY): Payer: 59

## 2021-05-16 ENCOUNTER — Emergency Department (HOSPITAL_COMMUNITY): Payer: 59 | Admitting: Anesthesiology

## 2021-05-16 ENCOUNTER — Ambulatory Visit (HOSPITAL_COMMUNITY)
Admission: EM | Admit: 2021-05-16 | Discharge: 2021-05-16 | Disposition: A | Discharge status: Home / Self Care | Payer: 59 | Attending: Emergency Medicine | Admitting: Emergency Medicine

## 2021-05-16 DIAGNOSIS — K31A19 Gastric intestinal metaplasia without dysplasia, unspecified site: Secondary | ICD-10-CM | POA: Diagnosis not present

## 2021-05-16 DIAGNOSIS — R1319 Other dysphagia: Secondary | ICD-10-CM

## 2021-05-16 DIAGNOSIS — K2970 Gastritis, unspecified, without bleeding: Secondary | ICD-10-CM

## 2021-05-16 DIAGNOSIS — E1122 Type 2 diabetes mellitus with diabetic chronic kidney disease: Secondary | ICD-10-CM | POA: Insufficient documentation

## 2021-05-16 DIAGNOSIS — I129 Hypertensive chronic kidney disease with stage 1 through stage 4 chronic kidney disease, or unspecified chronic kidney disease: Secondary | ICD-10-CM | POA: Diagnosis not present

## 2021-05-16 DIAGNOSIS — D638 Anemia in other chronic diseases classified elsewhere: Secondary | ICD-10-CM | POA: Insufficient documentation

## 2021-05-16 DIAGNOSIS — K317 Polyp of stomach and duodenum: Secondary | ICD-10-CM

## 2021-05-16 DIAGNOSIS — E1142 Type 2 diabetes mellitus with diabetic polyneuropathy: Secondary | ICD-10-CM | POA: Diagnosis not present

## 2021-05-16 DIAGNOSIS — K5909 Other constipation: Secondary | ICD-10-CM | POA: Diagnosis not present

## 2021-05-16 DIAGNOSIS — K222 Esophageal obstruction: Secondary | ICD-10-CM | POA: Insufficient documentation

## 2021-05-16 DIAGNOSIS — T18128A Food in esophagus causing other injury, initial encounter: Secondary | ICD-10-CM

## 2021-05-16 DIAGNOSIS — B9681 Helicobacter pylori [H. pylori] as the cause of diseases classified elsewhere: Secondary | ICD-10-CM | POA: Insufficient documentation

## 2021-05-16 DIAGNOSIS — N184 Chronic kidney disease, stage 4 (severe): Secondary | ICD-10-CM | POA: Diagnosis not present

## 2021-05-16 DIAGNOSIS — F419 Anxiety disorder, unspecified: Secondary | ICD-10-CM | POA: Insufficient documentation

## 2021-05-16 DIAGNOSIS — F32A Depression, unspecified: Secondary | ICD-10-CM | POA: Insufficient documentation

## 2021-05-16 DIAGNOSIS — E785 Hyperlipidemia, unspecified: Secondary | ICD-10-CM | POA: Insufficient documentation

## 2021-05-16 DIAGNOSIS — Z79891 Long term (current) use of opiate analgesic: Secondary | ICD-10-CM | POA: Insufficient documentation

## 2021-05-16 DIAGNOSIS — G894 Chronic pain syndrome: Secondary | ICD-10-CM | POA: Insufficient documentation

## 2021-05-16 DIAGNOSIS — T18108A Unspecified foreign body in esophagus causing other injury, initial encounter: Secondary | ICD-10-CM | POA: Diagnosis not present

## 2021-05-16 DIAGNOSIS — K219 Gastro-esophageal reflux disease without esophagitis: Secondary | ICD-10-CM | POA: Insufficient documentation

## 2021-05-16 DIAGNOSIS — M797 Fibromyalgia: Secondary | ICD-10-CM | POA: Insufficient documentation

## 2021-05-16 HISTORY — PX: BALLOON DILATION: SHX5330

## 2021-05-16 HISTORY — PX: BIOPSY: SHX5522

## 2021-05-16 HISTORY — PX: ESOPHAGOGASTRODUODENOSCOPY (EGD) WITH PROPOFOL: SHX5813

## 2021-05-16 HISTORY — PX: POLYPECTOMY: SHX5525

## 2021-05-16 LAB — COMPREHENSIVE METABOLIC PANEL
ALT: 15 U/L (ref 0–44)
AST: 21 U/L (ref 15–41)
Albumin: 3.6 g/dL (ref 3.5–5.0)
Alkaline Phosphatase: 82 U/L (ref 38–126)
Anion gap: 8 (ref 5–15)
BUN: 18 mg/dL (ref 8–23)
CO2: 29 mmol/L (ref 22–32)
Calcium: 8.7 mg/dL — ABNORMAL LOW (ref 8.9–10.3)
Chloride: 104 mmol/L (ref 98–111)
Creatinine, Ser: 2.57 mg/dL — ABNORMAL HIGH (ref 0.61–1.24)
GFR, Estimated: 26 mL/min — ABNORMAL LOW (ref >60)
Glucose, Bld: 98 mg/dL (ref 70–99)
Potassium: 3.9 mmol/L (ref 3.5–5.1)
Sodium: 141 mmol/L (ref 135–145)
Total Bilirubin: 0.5 mg/dL (ref 0.3–1.2)
Total Protein: 6.6 g/dL (ref 6.5–8.1)

## 2021-05-16 LAB — CBC WITH DIFFERENTIAL/PLATELET
Abs Immature Granulocytes: 0.08 10*3/uL — ABNORMAL HIGH (ref 0.00–0.07)
Basophils Absolute: 0.1 10*3/uL (ref 0.0–0.1)
Basophils Relative: 1 %
Eosinophils Absolute: 0.2 10*3/uL (ref 0.0–0.5)
Eosinophils Relative: 2 %
HCT: 33.6 % — ABNORMAL LOW (ref 39.0–52.0)
Hemoglobin: 10.4 g/dL — ABNORMAL LOW (ref 13.0–17.0)
Immature Granulocytes: 1 %
Lymphocytes Relative: 11 %
Lymphs Abs: 1 10*3/uL (ref 0.7–4.0)
MCH: 24.8 pg — ABNORMAL LOW (ref 26.0–34.0)
MCHC: 31 g/dL (ref 30.0–36.0)
MCV: 80.2 fL (ref 80.0–100.0)
Monocytes Absolute: 1 10*3/uL (ref 0.1–1.0)
Monocytes Relative: 11 %
Neutro Abs: 6.9 10*3/uL (ref 1.7–7.7)
Neutrophils Relative %: 74 %
Platelets: 205 10*3/uL (ref 150–400)
RBC: 4.19 MIL/uL — ABNORMAL LOW (ref 4.22–5.81)
RDW: 17.3 % — ABNORMAL HIGH (ref 11.5–15.5)
WBC: 9.2 10*3/uL (ref 4.0–10.5)
nRBC: 0 % (ref 0.0–0.2)

## 2021-05-16 LAB — GLUCOSE, CAPILLARY: Glucose-Capillary: 78 mg/dL (ref 70–99)

## 2021-05-16 SURGERY — ESOPHAGOGASTRODUODENOSCOPY (EGD) WITH PROPOFOL
Anesthesia: General

## 2021-05-16 MED ORDER — AMISULPRIDE (ANTIEMETIC) 5 MG/2ML IV SOLN: 10.0000 mg | Freq: Once | INTRAVENOUS | Status: DC | PRN

## 2021-05-16 MED ORDER — OXYCODONE-ACETAMINOPHEN 5-325 MG PO TABS
1.0000 | ORAL_TABLET | Freq: Once | ORAL | Status: AC
  Administered 2021-05-16: 1 via ORAL
  Filled 2021-05-16: qty 1

## 2021-05-16 MED ORDER — ONDANSETRON HCL 4 MG/2ML IJ SOLN: 4.0000 mg | Freq: Once | INTRAMUSCULAR | Status: DC | PRN

## 2021-05-16 MED ORDER — ONDANSETRON HCL 4 MG/2ML IJ SOLN
INTRAMUSCULAR | Status: DC | PRN
  Administered 2021-05-16: 4 mg via INTRAVENOUS

## 2021-05-16 MED ORDER — LACTATED RINGERS IV SOLN: INTRAVENOUS | Status: DC | PRN

## 2021-05-16 MED ORDER — MORPHINE SULFATE (PF) 4 MG/ML IV SOLN
4.0000 mg | Freq: Once | INTRAVENOUS | Status: AC
  Administered 2021-05-16: 4 mg via INTRAVENOUS
  Filled 2021-05-16: qty 1

## 2021-05-16 MED ORDER — SODIUM CHLORIDE 0.9 % IV SOLN: INTRAVENOUS | Status: DC

## 2021-05-16 MED ORDER — PROPOFOL 10 MG/ML IV BOLUS
INTRAVENOUS | Status: DC | PRN
  Administered 2021-05-16: 140 mg via INTRAVENOUS

## 2021-05-16 MED ORDER — LIDOCAINE HCL (CARDIAC) PF 100 MG/5ML IV SOSY
PREFILLED_SYRINGE | INTRAVENOUS | Status: DC | PRN
  Administered 2021-05-16: 40 mg via INTRATRACHEAL

## 2021-05-16 SURGICAL SUPPLY — 15 items

## 2021-05-16 NOTE — Anesthesia Postprocedure Evaluation (Signed)
Anesthesia Post Note ? ?Patient: [name] ? ?Procedure(s) Performed: ESOPHAGOGASTRODUODENOSCOPY (EGD) WITH PROPOFOL ?POLYPECTOMY ?BIOPSY ?BALLOON DILATION ? ?  ? ?Patient location during evaluation: PACU ?Anesthesia Type: General ?Level of consciousness: awake and alert ?Pain management: pain level controlled ?Vital Signs Assessment: post-procedure vital signs reviewed and stable ?Respiratory status: spontaneous breathing, nonlabored ventilation, respiratory function stable and patient connected to nasal cannula oxygen ?Cardiovascular status: blood pressure returned to baseline and stable ?Postop Assessment: no apparent nausea or vomiting ?Anesthetic complications: no ? ? ?No notable events documented. ? ?Last Vitals:  ?Vitals:  ? 05/16/21 2040 05/16/21 2055  ?BP: (!) 147/77 (!) 153/81  ?Pulse: 92 88  ?Resp: 14 14  ?Temp: 36.4 ?C 36.6 ?C  ?SpO2: 98%   ?  ?Last Pain:  ?Vitals:  ? 05/16/21 2055  ?TempSrc:   ?PainSc: 0-No pain  ? ? ?  ?  ?  ?  ?  ?  ? ?Effie Berkshire ? ? ? ? ?

## 2021-05-16 NOTE — ED Triage Notes (Signed)
Pt BIB [facility] from home. Pt ate a chicken drum stick and felt like a soft piece of chicken lodged into throat. Unable to clear throat. Airway patient. Does endorse some issues swallowing. EMS gave IV glucagon with no improvement.  ?CBG 191 after glucagon ?HR 92 ?164/86  ?97 % RA  ?

## 2021-05-16 NOTE — Op Note (Signed)
[facility] ?Patient Name: [name] ?Procedure Date : 05/16/2021 ?MRN: [id number] ?Attending MD: Jackquline Denmark , MD ?Date of Birth: Sep 15, 1950 ?CSN: [id number] ?Age: 71 ?Admit Type: Inpatient ?Procedure:                Upper GI endoscopy ?Indications:              Foreign body in the esophagus. H/O dysphagia. ?Providers:                Jackquline Denmark, MD, Vista Lawman, RN, Frazier Richards,  ?                          Technician ?Referring MD:             ED ?Medicines:                General Anesthesia ?Complications:            No immediate complications. ?Estimated Blood Loss:     Estimated blood loss was minimal. ?Procedure:                Pre-Anesthesia Assessment: ?                          - Prior to the procedure, a History and Physical  ?                          was performed, and patient medications and  ?                          allergies were reviewed. The patient's tolerance of  ?                          previous anesthesia was also reviewed. The risks  ?                          and benefits of the procedure and the sedation  ?                          options and risks were discussed with the patient.  ?                          All questions were answered, and informed consent  ?                          was obtained. Prior Anticoagulants: The patient has  ?                          taken no previous anticoagulant or antiplatelet  ?                          agents. ASA Grade Assessment: III - A patient with  ?                          severe systemic disease. After reviewing the risks  ?  and benefits, the patient was deemed in  ?                          satisfactory condition to undergo the procedure. ?                          After obtaining informed consent, the endoscope was  ?                          passed under direct vision. Throughout the  ?                          procedure, the patient's blood pressure, pulse, and  ?                           oxygen saturations were monitored continuously. The  ?                          [id number]-[id number] ([id number]) Olympus endoscope was introduced  ?                          through the mouth, and advanced to the second part  ?                          of duodenum. The upper GI endoscopy was  ?                          accomplished without difficulty. The patient  ?                          tolerated the procedure well. ?Scope In: ?Scope Out: ?Findings: ?     No food impaction was noted. It appears patient had already  ?     spontaneously passed food bolus. The examined esophagus was mildly  ?     tortuous with few circular furrows in the proximal esophagus. Biopsies  ?     were obtained from the proximal and distal esophagus with cold forceps  ?     to r/o EoE. ?     One benign-appearing, intrinsic mild (1.2 cm) stenosis was found 38 cm  ?     from the incisors at GE junction. A TTS dilator was passed through the  ?     scope. Dilation with a 15-16.5-18 mm balloon dilator was performed to  ?     16.5 mm. The dilation site was examined and showed mild mucosal  ?     disruption and moderate improvement in luminal narrowing. Biopsies were  ?     taken with a cold forceps for histology. I was able to withdraw 15 mm  ?     inflated balloon out of the esophagus. ?     Localized mild inflammation characterized by erythema was found in the  ?     gastric antrum. Biopsies were taken with a cold forceps for histology. ?     A few 4 to 6 mm sessile polyps with no bleeding and no stigmata of  ?     recent bleeding were found in the gastric body. Three  polyps were  ?     removed with a cold snare. Resection and retrieval were complete. ?     The examined duodenum was normal. ?Impression:               - No food impaction. It appears that food had  ?                          spontaneously passed ?                          - Benign-appearing esophageal stenosis. Dilated.  ?                          Biopsied. ?                          -  Gastritis. Biopsied. ?                          - A few gastric polyps. Resected and retrieved. ?                          - Normal examined duodenum. ?Recommendation:           - Patient has a contact number available for  ?                          emergencies. The signs and symptoms of potential  ?                          delayed complications were discussed with the  ?                          patient. Return to normal activities tomorrow.  ?                          Written discharge instructions were provided to the  ?                          patient. ?                          - Mechanical soft diet for 1 day. Then can go on  ?                          previous diet. I instructed him to chew foods  ?                          especially meats and breads well and eat slowly. ?                          - Continue present medications including Protonix  ?                          40 mg p.o. twice daily. ?                          -  Await pathology results. ?                          - Return to GI clinic (Dr Fuller Plan or APP) in 4 weeks.  ?                          Would recommend barium swallow as outpatient and  ?                          further eval. ?                          cc Dr Fuller Plan. ?Procedure Code(s):        --- Professional --- ?                          [phone], Esophagogastroduodenoscopy, flexible,  ?                          transoral; with removal of tumor(s), polyp(s), or  ?                          other lesion(s) by snare technique ?                          [id number], Esophagogastroduodenoscopy, flexible,  ?                          transoral; with transendoscopic balloon dilation of  ?                          esophagus (less than 30 mm diameter) ?                          43239, 59, Esophagogastroduodenoscopy, flexible,  ?                          transoral; with biopsy, single or multiple ?Diagnosis Code(s):        --- Professional --- ?                          Q39.9, Congenital malformation of  esophagus,  ?                          unspecified ?                          K22.2, Esophageal obstruction ?                          K29.70, Gastritis, unspecified, without bleeding ?                          K31.7, Polyp of stomach and duodenum ?                          T18.108A, Unspecified foreign body in esophagus  ?  causing other injury, initial encounter ?CPT copyright 2019 [employer]. All rights reserved. ?The codes documented in this report are preliminary and upon coder review may  ?be revised to meet current compliance requirements. ?Jackquline Denmark, MD ?05/16/2021 8:43:09 PM ?This report has been signed electronically. ?Number of Addenda: 0 ?

## 2021-05-16 NOTE — H&P (Signed)
? ? ? Consultation ? ?Referring Provider: ED     ?Primary Care Physician:  Elwyn Reach, MD ?Primary Gastroenterologist:   Dr. Lucio Kshawn      ?Reason for Consultation:   Food impaction ?       ? HPI:   ?[name] is a 71 y.o. male  ?With DM2, CKD4, anemia of chronic disease, IgG k melanoma, IDA, HTN, HLD, anxiety/depression, peripheral neuropathy, chronic constipation, chronic pain syndrome/LBP/fibromyalgia on narcotics, polypharmacy ? ?He was eating chicken 3 hours ago when it got "stuck".  He has not been able to handle his secretions. ?He was given glucagon without any relief. ? ?He told me he had similar episodes 6 months ago when he had endoscopic disimpaction in [facility].  I could not find any notes in Care Everywhere.  It could have been at [facility]. ? 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 ? ?I have reviewed Dr. Lynne Leader notes. ? ?Last EGD was 10/2014 under procedures tab. ?1. The mucosa of the esophagus appeared normal; esophagus was dilated using a savary dilator 17 mm over guidewire ?2. The mucosa of the stomach appeared normal ?3. The duodenal mucosa showed no abnormalities ? ?Past Medical History:  ?Diagnosis Date  ? Anemia   ? Anemia of renal disease 03/12/2011  ? Anemia, iron deficiency 03/12/2011  ? Anxiety   ? Anxiety disorder   ? Arthralgia of multiple joints 12/31/2010  ? Arthritis   ? Constipation   ? Diabetes mellitus without complication ([place])   ? Diabetic neuropathy ([place])   ? Diverticulosis   ? Epidermoid cyst of skin of chest 08/29/2018  ? Fibromyalgia   ? GERD (gastroesophageal reflux disease)   ? Headache   ? Hemorrhoids   ? Herpes zoster   ? History of COVID-19 04/03/2019  ? Hyperlipidemia   ? Hypertension   ? Hypotestosteronism 03/17/2012  ? Multiple myeloma   ? Multiple myeloma ([place])   ? Myeloma ([place]) 12/31/2010  ? Nevus of left elbow 08/29/2018  ? Pneumonia   ? Renal insufficiency   ? Type 2 diabetes mellitus ([place])   ? Urinary retention    ? ? ?Past Surgical History:  ?Procedure Laterality Date  ? CATARACT EXTRACTION    ? bi-lateral  ? COLONOSCOPY    ? COLONOSCOPY  07/13/2011  ? Procedure: COLONOSCOPY;  Surgeon: Ladene Artist, MD,FACG;  Location: [facility];  Service: Endoscopy;  Laterality: N/A;  ? CYST EXCISION Right 08/29/2018  ? Procedure: CYST REMOVAL;  Surgeon: Fanny Skates, MD;  Location: [place];  Service: General;  Laterality: Right;  ? ESOPHAGOGASTRODUODENOSCOPY    ? IR CHEST FLUORO  09/22/2016  ? IR CHEST FLUORO  04/25/2020  ? IR FLUORO GUIDE PORT INSERTION RIGHT  09/23/2016  ? IR REMOVAL TUN ACCESS W/ PORT W/O FL MOD SED  09/23/2016  ? IR US GUIDE VASC ACCESS LEFT  09/23/2016  ? KNEE ARTHROSCOPY    ? left  ? KNEE SURGERY    ? MASS EXCISION Right 05/09/2020  ? Procedure: RIGHT LONG FINGER EXCISION MASS,;  Surgeon: Leanora Cover, MD;  Location: [place];  Service: Orthopedics;  Laterality: Right;  ? NEVUS EXCISION N/A 08/29/2018  ? Procedure: , NEVUS LEFT ELBOW;  Surgeon: Fanny Skates, MD;  Location: [place];  Service: General;  Laterality: N/A;  ? PORTACATH PLACEMENT    ? POSTERIOR CERVICAL FUSION/FORAMINOTOMY N/A 06/05/2016  ? Procedure: LAMINECTOMY CERVICAL 3 - CERVICAL 6  WITH LATERAL MASS FUSION;  Surgeon: Consuella Lose, MD;  Location: [place];  Service: Neurosurgery;  Laterality: N/A;  LAMINECTOMY CERVICAL 3 - CERVICAL 6 WITH LATERAL MASS FUSION  ? TONSILLECTOMY    ? TRANSURETHRAL RESECTION OF PROSTATE  01/2011  ? Dr. at [facility] in [place]  ? ? ?Family History  ?Problem Relation Age of Onset  ? Breast cancer Sister   ? Breast cancer Mother   ? Heart disease Mother   ? Diabetes Sister   ? Bone cancer Father   ? Heart disease Sister   ? Heart disease Daughter   ? Throat cancer Brother   ? Stomach cancer Brother   ? Colon cancer Neg Hx   ?  ? ?Social History  ? ?Tobacco Use  ? Smoking status: Never  ? Smokeless tobacco: Never  ?Vaping Use  ? Vaping Use: Never used  ?Substance Use  Topics  ? Alcohol use: Not Currently  ?  Alcohol/week: 0.0 standard drinks  ? Drug use: Not Currently  ?  Comment: nothing in twenty years  ? ? ?Prior to Admission medications   ?Medication Sig Start Date End Date Taking? Authorizing Provider  ?amLODipine (NORVASC) 10 MG tablet Take 10 mg by mouth daily.    [provider]  ?ammonium lactate (AMLACTIN) 12 % lotion Apply 1 application topically as needed for dry skin. 04/11/21   Felipa Furnace, DPM  ?atorvastatin (LIPITOR) 40 MG tablet Take 40 mg by mouth daily at 6 PM.  08/20/18   [provider]  ?budesonide-formoterol (SYMBICORT) 160-4.5 MCG/ACT inhaler SMARTSIG:2 Puff(s) By Mouth Twice Daily 03/18/21   [provider]  ?CARAFATE 1 GM/10ML suspension Take 1 g by mouth 4 (four) times daily.  07/28/11   [provider]  ?carvedilol (COREG) 25 MG tablet Take 25 mg by mouth 2 (two) times daily. 04/29/20   [provider]  ?chlorpheniramine-HYDROcodone 10-8 MG/5ML SMARTSIG:5 Milliliter(s) By Mouth Every 12 Hours 04/14/21   [provider]  ?[employer] Fiber Gummies 2 g CHEW Chew by mouth daily. 03/09/20   [provider]  ?diazepam (VALIUM) 5 MG tablet Take by mouth at bedtime as needed.    [provider]  ?dicyclomine (BENTYL) 10 MG capsule TAKE 1 CAPSULE (10 MG TOTAL) BY MOUTH 3 (THREE) TIMES DAILY BEFORE MEALS. 08/05/20   Volanda Napoleon, MD  ?dronabinol (MARINOL) 2.5 MG capsule Take 2.5 mg by mouth 2 (two) times daily before lunch and supper. 01/10/20   [provider]  ?gabapentin (NEURONTIN) 300 MG capsule TAKE 1 CAPSULE BY MOUTH THREE TIMES A DAY ?Patient taking differently: Take 300 mg by mouth 3 (three) times daily. 07/20/19   Celso Amy, NP  ?glimepiride (AMARYL) 2 MG tablet Take 2 mg by mouth daily. 09/18/20   [provider]  ?hydrocortisone 2.5 % cream Apply topically. 02/15/20   [provider]  ?lactulose (CHRONULAC) 10 GM/15ML solution Take 30 mLs (20 g total) by  mouth daily. 02/02/19   Volanda Napoleon, MD  ?latanoprost (XALATAN) 0.005 % ophthalmic solution Place 1 drop into both eyes at bedtime. 01/10/20   [provider]  ?linaclotide Rolan Lipa) 145 MCG CAPS capsule Take by mouth daily as needed.    [provider]  ?losartan (COZAAR) 50 MG tablet Take 50 mg by mouth daily. 02/25/19   [provider]  ?LUMIGAN 0.01 % SOLN Place 1 drop into both eyes 3 (three) times daily. 01/06/20   [provider]  ?mesalamine Doristine Johns)  1.2 g EC tablet Take by mouth daily with breakfast.    [provider]  ?metoprolol succinate (TOPROL-XL) 25 MG 24 hr tablet Take 25 mg by mouth daily. 07/28/19   [provider]  ?mirabegron ER (MYRBETRIQ) 25 MG TB24 tablet Take 20 mg by mouth daily.    [provider]  ?morphine (MS CONTIN) 30 MG 12 hr tablet Take 30 mg by mouth 2 (two) times daily. 06/29/18   [provider]  ?omeprazole (PRILOSEC) 40 MG capsule TAKE 1 CAPSULE BY MOUTH TWICE A DAY 10/04/19   Volanda Napoleon, MD  ?ondansetron (ZOFRAN) 8 MG tablet TAKE 1 TABLET(8 MG) BY MOUTH TWICE DAILY AS NEEDED FOR NAUSEA OR VOMITING 12/10/20   Volanda Napoleon, MD  ?oxyCODONE (ROXICODONE) 5 MG immediate release tablet 1 tab PO q6 hours prn pain 05/09/20   Leanora Cover, MD  ?oxyCODONE-acetaminophen (PERCOCET) 10-325 MG tablet Take 1 tablet by mouth every 6 (six) hours as needed for pain.    [provider]  ?pioglitazone (ACTOS) 30 MG tablet Take 30 mg by mouth daily. 03/06/18   [provider]  ?prochlorperazine (COMPAZINE) 10 MG tablet take 1 tablet by mouth every 6 hours if needed for nausea and vomiting 05/05/21   Volanda Napoleon, MD  ?RESTASIS 0.05 % ophthalmic emulsion Place 1 drop into both eyes daily.  05/30/19   [provider]  ?SUMAtriptan (IMITREX) 100 MG tablet Take 100 mg by mouth daily. 08/22/19   [provider]  ?tadalafil (CIALIS) 10 MG tablet Take by mouth. 05/01/20 05/31/20  [provider]  ?topiramate (TOPAMAX) 25 MG capsule Take 25 mg by mouth 2 (two) times daily.    [provider]  ?torsemide (DEMADEX) 20 MG tablet Take 40 mg by mouth daily.  01/12/19   Provider, [facility]

## 2021-05-16 NOTE — Anesthesia Preprocedure Evaluation (Addendum)
Anesthesia Evaluation  ?Patient identified by MRN, date of birth, ID band ?Patient awake ? ? ? ?Reviewed: ?Allergy & Precautions, NPO status , Patient's Chart, lab work & pertinent test results, reviewed documented beta blocker date and time  ? ?Airway ?Mallampati: I ? ?TM Distance: >3 FB ?Neck ROM: Full ? ? ? Dental ? ?(+) Upper Dentures, Dental Advisory Given ?  ?Pulmonary ? ?  ?breath sounds clear to auscultation ? ? ? ? ? ? Cardiovascular ?hypertension, Pt. on medications and Pt. on home beta blockers ? ?Rhythm:Regular Rate:Normal ? ? ?  ?Neuro/Psych ? Headaches, PSYCHIATRIC DISORDERS Anxiety  Neuromuscular disease   ? GI/Hepatic ?Neg liver ROS, GERD  Medicated,  ?Endo/Other  ?diabetes, Type 2, Oral Hypoglycemic Agents ? Renal/GU ?Renal disease  ? ?  ?Musculoskeletal ? ?(+) Arthritis , Fibromyalgia - ? Abdominal ?Normal abdominal exam  (+)   ?Peds ? Hematology ? ?(+) Blood dyscrasia, anemia ,   ?Anesthesia Other Findings ? ? Reproductive/Obstetrics ? ?  ? ? ? ? ? ? ? ? ? ? ? ? ? ?  ?  ? ? ? ? ? ? ? ?Anesthesia Physical ?Anesthesia Plan ? ?ASA: 3 ? ?Anesthesia Plan: General  ? ?Post-op Pain Management:   ? ?Induction: Intravenous, Rapid sequence and Cricoid pressure planned ? ?PONV Risk Score and Plan: 1 and Ondansetron ? ?Airway Management Planned: Oral ETT ? ?Additional Equipment: None ? ?Intra-op Plan:  ? ?Post-operative Plan: Extubation in OR ? ?Informed Consent: I have reviewed the patients History and Physical, chart, labs and discussed the procedure including the risks, benefits and alternatives for the proposed anesthesia with the patient or authorized representative who has indicated his/her understanding and acceptance.  ? ? ? ? ? ?Plan Discussed with: CRNA ? ?Anesthesia Plan Comments:   ? ? ? ? ? ? ?Anesthesia Quick Evaluation ? ?

## 2021-05-16 NOTE — Anesthesia Procedure Notes (Signed)
Procedure Name: Intubation ?Date/Time: 05/16/2021 8:15 PM ?Performed by: Clovis Cao, CRNA ?Pre-anesthesia Checklist: Patient identified, Emergency Drugs available, Suction available and Patient being monitored ?Patient Re-evaluated:Patient Re-evaluated prior to induction ?Oxygen Delivery Method: Circle system utilized ?Preoxygenation: Pre-oxygenation with 100% oxygen ?Induction Type: IV induction, Rapid sequence and Cricoid Pressure applied ?Laryngoscope Size: Sabra Heck and 2 ?Grade View: Grade I ?Tube type: Oral ?Tube size: 7.5 mm ?Number of attempts: 1 ?Airway Equipment and Method: Stylet ?Placement Confirmation: ETT inserted through vocal cords under direct vision, positive ETCO2 and breath sounds checked- equal and bilateral ?Secured at: 22 cm ?Tube secured with: Tape ?Dental Injury: Teeth and Oropharynx as per pre-operative assessment  ? ? ? ? ?

## 2021-05-16 NOTE — ED Provider Triage Note (Signed)
Emergency Medicine Provider Triage Evaluation Note ? ?[name] , a 71 y.o. male  was evaluated in triage.  Pt complains of foreign body sensation in his throat.  He states this occurred started about 3 hours ago.  He while he was eating chicken.  He states that something feels like it is stuck in the midesophagus.  He does have any pain or any sharp sensations, but describes discomfort.  He has tried to drink water and it did go down, however he also felt like it got stuck for a while.  Tolerating secretions. Of note, he has a history of multiple myeloma and is scheduled for his home medication of morphine.  I told him that we likely do not have the p.o. morphine and vomiting so at the moment, however we can give him a dose of Percocet right now. ? ?Review of Systems  ?Positive: Fb sensation ?Negative:  ? ?Physical Exam  ?BP (!) 149/69 (BP Location: Right Arm)   Pulse 91   Temp 99.1 ?F (37.3 ?C)   Resp 16   Ht '5\' 11"'  (1.803 m)   Wt 83.9 kg   SpO2 95%   BMI 25.80 kg/m?  ?Gen:   Awake, no distress   ?Resp:  Normal effort  ?MSK:   Moves extremities without difficulty  ?Other:  Pain in the neck.  There is no neck swelling.  Patient is able to tolerate secretions and swallow. ? ?Medical Decision Making  ?Medically screening exam initiated at 4:37 PM.  Appropriate orders placed.  [name] was informed that the remainder of the evaluation will be completed by another provider, this initial triage assessment does not replace that evaluation, and the importance of remaining in the ED until their evaluation is complete. ? ?Neck xr for fb. Will dose with percocet here ?  ?Adolphus Birchwood, PA-C ?05/16/21 1639 ? ?

## 2021-05-16 NOTE — ED Provider Notes (Signed)
?[place] ?Provider Note ? ? ?CSN: [id number] ?Arrival date & time: 05/16/21  1621 ? ?  ? ?History ? ?Chief Complaint  ?Patient presents with  ? Swallowed Foreign Body  ? ? ?[name] is a 71 y.o. male. ? ?HPI ?71 year old male presents with chicken stuck in his throat.  He was eating chicken a couple hours prior to arrival and it felt like he got stuck.  Feels like its around his manubrium.  He has had this before and has had to have his esophagus stretched many times.  Otherwise he is not having any trouble breathing and is tolerating his secretions.  He tried to drink the tea to get it to go down but it did not work. ? ?Home Medications ?Prior to Admission medications   ?Medication Sig Start Date End Date Taking? Authorizing Provider  ?amLODipine (NORVASC) 10 MG tablet Take 10 mg by mouth daily.    [provider]  ?ammonium lactate (AMLACTIN) 12 % lotion Apply 1 application topically as needed for dry skin. 04/11/21   Felipa Furnace, DPM  ?atorvastatin (LIPITOR) 40 MG tablet Take 40 mg by mouth daily at 6 PM.  08/20/18   [provider]  ?budesonide-formoterol (SYMBICORT) 160-4.5 MCG/ACT inhaler SMARTSIG:2 Puff(s) By Mouth Twice Daily 03/18/21   [provider]  ?CARAFATE 1 GM/10ML suspension Take 1 g by mouth 4 (four) times daily.  07/28/11   [provider]  ?carvedilol (COREG) 25 MG tablet Take 25 mg by mouth 2 (two) times daily. 04/29/20   [provider]  ?chlorpheniramine-HYDROcodone 10-8 MG/5ML SMARTSIG:5 Milliliter(s) By Mouth Every 12 Hours 04/14/21   [provider]  ?[employer] Fiber Gummies 2 g CHEW Chew by mouth daily. 03/09/20   [provider]  ?diazepam (VALIUM) 5 MG tablet Take by mouth at bedtime as needed.    [provider]  ?dicyclomine (BENTYL) 10 MG capsule TAKE 1 CAPSULE (10 MG TOTAL) BY MOUTH 3 (THREE) TIMES DAILY BEFORE MEALS. 08/05/20   Volanda Napoleon, MD  ?dronabinol (MARINOL) 2.5 MG  capsule Take 2.5 mg by mouth 2 (two) times daily before lunch and supper. 01/10/20   [provider]  ?gabapentin (NEURONTIN) 300 MG capsule TAKE 1 CAPSULE BY MOUTH THREE TIMES A DAY ?Patient taking differently: Take 300 mg by mouth 3 (three) times daily. 07/20/19   Celso Amy, NP  ?glimepiride (AMARYL) 2 MG tablet Take 2 mg by mouth daily. 09/18/20   [provider]  ?hydrocortisone 2.5 % cream Apply topically. 02/15/20   [provider]  ?lactulose (CHRONULAC) 10 GM/15ML solution Take 30 mLs (20 g total) by mouth daily. 02/02/19   Volanda Napoleon, MD  ?latanoprost (XALATAN) 0.005 % ophthalmic solution Place 1 drop into both eyes at bedtime. 01/10/20   [provider]  ?linaclotide Rolan Lipa) 145 MCG CAPS capsule Take by mouth daily as needed.    [provider]  ?losartan (COZAAR) 50 MG tablet Take 50 mg by mouth daily. 02/25/19   [provider]  ?LUMIGAN 0.01 % SOLN Place 1 drop into both eyes 3 (three) times daily. 01/06/20   [provider]  ?mesalamine (LIALDA) 1.2 g EC tablet Take by mouth daily with breakfast.    [provider]  ?metoprolol succinate (TOPROL-XL) 25 MG 24 hr tablet Take 25 mg by mouth daily. 07/28/19   [provider]  ?mirabegron ER (MYRBETRIQ) 25 MG TB24 tablet Take 20 mg by mouth daily.  [provider]  ?morphine (MS CONTIN) 30 MG 12 hr tablet Take 30 mg by mouth 2 (two) times daily. 06/29/18   [provider]  ?omeprazole (PRILOSEC) 40 MG capsule TAKE 1 CAPSULE BY MOUTH TWICE A DAY 10/04/19   Volanda Napoleon, MD  ?ondansetron (ZOFRAN) 8 MG tablet TAKE 1 TABLET(8 MG) BY MOUTH TWICE DAILY AS NEEDED FOR NAUSEA OR VOMITING 12/10/20   Volanda Napoleon, MD  ?oxyCODONE (ROXICODONE) 5 MG immediate release tablet 1 tab PO q6 hours prn pain 05/09/20   Leanora Cover, MD  ?oxyCODONE-acetaminophen (PERCOCET) 10-325 MG tablet Take 1 tablet by mouth every 6 (six) hours as needed for pain.    [provider]  ?pioglitazone (ACTOS) 30 MG tablet Take 30 mg by mouth daily. 03/06/18   [provider]  ?prochlorperazine (COMPAZINE) 10 MG tablet take 1 tablet by mouth every 6 hours if needed for nausea and vomiting 05/05/21   Volanda Napoleon, MD  ?RESTASIS 0.05 % ophthalmic emulsion Place 1 drop into both eyes daily.  05/30/19   [provider]  ?SUMAtriptan (IMITREX) 100 MG tablet Take 100 mg by mouth daily. 08/22/19   [provider]  ?tadalafil (CIALIS) 10 MG tablet Take by mouth. 05/01/20 05/31/20  [provider]  ?topiramate (TOPAMAX) 25 MG capsule Take 25 mg by mouth 2 (two) times daily.    [provider]  ?torsemide (DEMADEX) 20 MG tablet Take 40 mg by mouth daily.  01/12/19   [provider]  ?TRULICITY 4.40 NU/2.7OZ SOPN Inject 0.75 mg into the skin every Saturday.  04/19/19   [provider]  ?Vitamin D, Ergocalciferol, (DRISDOL) 50000 UNITS CAPS capsule Take 50,000 Units by mouth every Saturday.     [provider]  ?potassium chloride (KLOR-CON) 20 MEQ packet Take 20 mEq by mouth daily.   05/17/11  [provider]  ?potassium chloride SA (K-DUR,KLOR-CON) 20 MEQ tablet Take 1 tablet (20 mEq total) by mouth 2 (two) times daily. ?Patient not taking: Reported on 05/01/2019 11/07/14 05/01/19  Volanda Napoleon, MD  ?   ? ?Allergies    ?Iodinated contrast media and Iodine   ? ?Review of Systems   ?Review of Systems  ?HENT:  Positive for trouble swallowing.   ?Respiratory:  Negative for shortness of breath.   ?Cardiovascular:  Negative for chest pain.  ? ?Physical Exam ?Updated Vital Signs ?BP (!) 153/81 (BP Location: Right Arm)   Pulse 88   Temp 97.9 ?F (36.6 ?C)   Resp 14   Ht '5\' 11"'$  (1.803 m)   Wt 83.9 kg   SpO2 98%   BMI 25.80 kg/m?  ?Physical Exam ?Vitals and nursing note reviewed.  ?Constitutional:   ?   Appearance: He is well-developed.  ?   Comments: I gave the patient a small piece of water and he fairly quickly ended up  spitting and vomiting it back up.  ?HENT:  ?   Head: Normocephalic and atraumatic.  ?   Mouth/Throat:  ?   Comments: No obvious obstruction in the oral airway.  No drooling. ?Cardiovascular:  ?   Rate and Rhythm: Normal rate and regular rhythm.  ?   Heart sounds: Normal heart sounds.  ?Pulmonary:  ?   Effort: Pulmonary effort is normal.  ?   Breath sounds: Normal breath sounds.  ?Abdominal:  ?   Palpations: Abdomen is soft.  ?   Tenderness: There is no abdominal tenderness.  ?Skin: ?   General: Skin  is warm and dry.  ?Neurological:  ?   Mental Status: He is alert.  ? ? ?ED Results / Procedures / Treatments   ?Labs ?(all labs ordered are listed, but only abnormal results are displayed) ?Labs Reviewed  ?COMPREHENSIVE METABOLIC PANEL - Abnormal; Notable for the following components:  ?    Result Value  ? Creatinine, Ser 2.57 (*)   ? Calcium 8.7 (*)   ? GFR, Estimated 26 (*)   ? All other components within normal limits  ?CBC WITH DIFFERENTIAL/PLATELET - Abnormal; Notable for the following components:  ? RBC 4.19 (*)   ? Hemoglobin 10.4 (*)   ? HCT 33.6 (*)   ? MCH 24.8 (*)   ? RDW 17.3 (*)   ? Abs Immature Granulocytes 0.08 (*)   ? All other components within normal limits  ?GLUCOSE, CAPILLARY  ?SURGICAL PATHOLOGY  ? ? ?EKG ?None ? ?Radiology ?DG Neck Soft Tissue ? ?Result Date: 05/16/2021 ?CLINICAL DATA:  Foreign body evaluation EXAM: NECK SOFT TISSUES - 1+ VIEW COMPARISON:  Cervical spine radiograph 12/12/2020 FINDINGS: There is no evidence of retropharyngeal soft tissue swelling or epiglottic enlargement. The cervical airway is unremarkable and no radio-opaque foreign body identified overlying the soft tissues of the neck. Prior posterior cervical fusion without evidence of hardware complication. Bulky anterior osteophyte formation. Heterotopic ossification along the nuchal ligament posteriorly. Partially visualized right neck catheter. IMPRESSION: No evidence of radiopaque foreign body. Electronically Signed   By:  Maurine Simmering M.D.   On: 05/16/2021 17:28  ? ?DG Chest 2 View ? ?Result Date: 05/16/2021 ?CLINICAL DATA:  Difficulty swallowing, possible foreign body EXAM: CHEST - 2 VIEW COMPARISON:  05/17/2019 FINDINGS: Transverse d

## 2021-05-16 NOTE — Transfer of Care (Signed)
Immediate Anesthesia Transfer of Care Note ? ?Patient: [name] ? ?Procedure(s) Performed: ESOPHAGOGASTRODUODENOSCOPY (EGD) WITH PROPOFOL ?POLYPECTOMY ?BIOPSY ?BALLOON DILATION ? ?Patient Location: PACU ? ?Anesthesia Type:General ? ?Level of Consciousness: awake ? ?Airway & Oxygen Therapy: Patient Spontanous Breathing ? ?Post-op Assessment: Report given to RN and Post -op Vital signs reviewed and stable ? ?Post vital signs: Reviewed and stable ? ?Last Vitals:  ?Vitals Value Taken Time  ?BP    ?Temp    ?Pulse    ?Resp    ?SpO2    ? ? ?Last Pain:  ?Vitals:  ? 05/16/21 1954  ?TempSrc: Temporal  ?PainSc: 0-No pain  ?   ? ?  ? ?Complications: No notable events documented. ?

## 2021-05-19 ENCOUNTER — Encounter (HOSPITAL_COMMUNITY): Payer: Self-pay | Admitting: Gastroenterology

## 2021-05-20 LAB — SURGICAL PATHOLOGY

## 2021-05-22 ENCOUNTER — Encounter: Payer: Self-pay | Admitting: Gastroenterology

## 2021-05-27 ENCOUNTER — Other Ambulatory Visit: Payer: Self-pay

## 2021-05-27 DIAGNOSIS — T18128A Food in esophagus causing other injury, initial encounter: Secondary | ICD-10-CM

## 2021-05-27 DIAGNOSIS — R1310 Dysphagia, unspecified: Secondary | ICD-10-CM

## 2021-06-02 ENCOUNTER — Inpatient Hospital Stay: Payer: 59

## 2021-06-02 ENCOUNTER — Encounter: Payer: Self-pay | Admitting: Hematology & Oncology

## 2021-06-02 ENCOUNTER — Inpatient Hospital Stay: Payer: 59 | Attending: Hematology & Oncology

## 2021-06-02 ENCOUNTER — Inpatient Hospital Stay (HOSPITAL_BASED_OUTPATIENT_CLINIC_OR_DEPARTMENT_OTHER): Payer: 59 | Admitting: Hematology & Oncology

## 2021-06-02 ENCOUNTER — Telehealth: Payer: Self-pay | Admitting: *Deleted

## 2021-06-02 ENCOUNTER — Other Ambulatory Visit: Payer: Self-pay

## 2021-06-02 VITALS — BP 127/60 | HR 83 | Temp 98.7°F | Resp 18 | Wt 176.0 lb

## 2021-06-02 DIAGNOSIS — N189 Chronic kidney disease, unspecified: Secondary | ICD-10-CM | POA: Insufficient documentation

## 2021-06-02 DIAGNOSIS — E349 Endocrine disorder, unspecified: Secondary | ICD-10-CM | POA: Diagnosis not present

## 2021-06-02 DIAGNOSIS — C9000 Multiple myeloma not having achieved remission: Secondary | ICD-10-CM | POA: Diagnosis present

## 2021-06-02 DIAGNOSIS — M4712 Other spondylosis with myelopathy, cervical region: Secondary | ICD-10-CM

## 2021-06-02 DIAGNOSIS — Z5111 Encounter for antineoplastic chemotherapy: Secondary | ICD-10-CM | POA: Insufficient documentation

## 2021-06-02 DIAGNOSIS — E291 Testicular hypofunction: Secondary | ICD-10-CM | POA: Diagnosis not present

## 2021-06-02 DIAGNOSIS — D631 Anemia in chronic kidney disease: Secondary | ICD-10-CM

## 2021-06-02 DIAGNOSIS — D509 Iron deficiency anemia, unspecified: Secondary | ICD-10-CM | POA: Insufficient documentation

## 2021-06-02 DIAGNOSIS — Z5112 Encounter for antineoplastic immunotherapy: Secondary | ICD-10-CM | POA: Insufficient documentation

## 2021-06-02 DIAGNOSIS — Z299 Encounter for prophylactic measures, unspecified: Secondary | ICD-10-CM

## 2021-06-02 DIAGNOSIS — M069 Rheumatoid arthritis, unspecified: Secondary | ICD-10-CM | POA: Insufficient documentation

## 2021-06-02 DIAGNOSIS — D500 Iron deficiency anemia secondary to blood loss (chronic): Secondary | ICD-10-CM

## 2021-06-02 LAB — CMP (CANCER CENTER ONLY)
ALT: 10 U/L (ref 0–44)
AST: 16 U/L (ref 15–41)
Albumin: 4.1 g/dL (ref 3.5–5.0)
Alkaline Phosphatase: 68 U/L (ref 38–126)
Anion gap: 9 (ref 5–15)
BUN: 32 mg/dL — ABNORMAL HIGH (ref 8–23)
CO2: 29 mmol/L (ref 22–32)
Calcium: 8.3 mg/dL — ABNORMAL LOW (ref 8.9–10.3)
Chloride: 102 mmol/L (ref 98–111)
Creatinine: 2.35 mg/dL — ABNORMAL HIGH (ref 0.61–1.24)
GFR, Estimated: 29 mL/min — ABNORMAL LOW (ref >60)
Glucose, Bld: 207 mg/dL — ABNORMAL HIGH (ref 70–99)
Potassium: 3.3 mmol/L — ABNORMAL LOW (ref 3.5–5.1)
Sodium: 140 mmol/L (ref 135–145)
Total Bilirubin: 0.5 mg/dL (ref 0.3–1.2)
Total Protein: 6.5 g/dL (ref 6.5–8.1)

## 2021-06-02 LAB — CBC WITH DIFFERENTIAL (CANCER CENTER ONLY)
Abs Immature Granulocytes: 0.02 10*3/uL (ref 0.00–0.07)
Basophils Absolute: 0.1 10*3/uL (ref 0.0–0.1)
Basophils Relative: 1 %
Eosinophils Absolute: 0.2 10*3/uL (ref 0.0–0.5)
Eosinophils Relative: 4 %
HCT: 31.4 % — ABNORMAL LOW (ref 39.0–52.0)
Hemoglobin: 10.2 g/dL — ABNORMAL LOW (ref 13.0–17.0)
Immature Granulocytes: 0 %
Lymphocytes Relative: 17 %
Lymphs Abs: 0.8 10*3/uL (ref 0.7–4.0)
MCH: 25.5 pg — ABNORMAL LOW (ref 26.0–34.0)
MCHC: 32.5 g/dL (ref 30.0–36.0)
MCV: 78.5 fL — ABNORMAL LOW (ref 80.0–100.0)
Monocytes Absolute: 0.6 10*3/uL (ref 0.1–1.0)
Monocytes Relative: 13 %
Neutro Abs: 2.9 10*3/uL (ref 1.7–7.7)
Neutrophils Relative %: 65 %
Platelet Count: 162 10*3/uL (ref 150–400)
RBC: 4 MIL/uL — ABNORMAL LOW (ref 4.22–5.81)
RDW: 16 % — ABNORMAL HIGH (ref 11.5–15.5)
WBC Count: 4.5 10*3/uL (ref 4.0–10.5)
nRBC: 0 % (ref 0.0–0.2)

## 2021-06-02 LAB — IRON AND IRON BINDING CAPACITY (CC-WL,HP ONLY)
Iron: 83 ug/dL (ref 45–182)
Saturation Ratios: 36 % (ref 17.9–39.5)
TIBC: 232 ug/dL — ABNORMAL LOW (ref 250–450)
UIBC: 149 ug/dL (ref 117–376)

## 2021-06-02 LAB — LACTATE DEHYDROGENASE: LDH: 204 U/L — ABNORMAL HIGH (ref 98–192)

## 2021-06-02 LAB — FERRITIN: Ferritin: 1574 ng/mL — ABNORMAL HIGH (ref 24–336)

## 2021-06-02 MED ORDER — HYDROMORPHONE HCL 1 MG/ML IJ SOLN
2.0000 mg | Freq: Once | INTRAMUSCULAR | Status: AC
  Administered 2021-06-02: 2 mg via INTRAVENOUS
  Filled 2021-06-02: qty 2

## 2021-06-02 MED ORDER — DEXTROSE 5 % IV SOLN
29.0000 mg/m2 | Freq: Once | INTRAVENOUS | Status: AC
  Administered 2021-06-02: 60 mg via INTRAVENOUS
  Filled 2021-06-02: qty 30

## 2021-06-02 MED ORDER — SODIUM CHLORIDE 0.9% FLUSH
10.0000 mL | INTRAVENOUS | Status: DC | PRN
  Administered 2021-06-02: 10 mL

## 2021-06-02 MED ORDER — PALONOSETRON HCL INJECTION 0.25 MG/5ML
0.2500 mg | Freq: Once | INTRAVENOUS | Status: AC
  Administered 2021-06-02: 0.25 mg via INTRAVENOUS
  Filled 2021-06-02: qty 5

## 2021-06-02 MED ORDER — SODIUM CHLORIDE 0.9 % IV SOLN: Freq: Once | INTRAVENOUS | Status: AC

## 2021-06-02 MED ORDER — HEPARIN SOD (PORK) LOCK FLUSH 100 UNIT/ML IV SOLN
500.0000 [IU] | Freq: Once | INTRAVENOUS | Status: AC | PRN
  Administered 2021-06-02: 500 [IU]

## 2021-06-02 MED ORDER — SODIUM CHLORIDE 0.9 % IV SOLN
300.0000 mg/m2 | Freq: Once | INTRAVENOUS | Status: AC
  Administered 2021-06-02: 620 mg via INTRAVENOUS
  Filled 2021-06-02: qty 31

## 2021-06-02 MED ORDER — SODIUM CHLORIDE 0.9 % IV SOLN
60.0000 mg | Freq: Once | INTRAVENOUS | Status: AC
  Administered 2021-06-02: 60 mg via INTRAVENOUS
  Filled 2021-06-02: qty 20

## 2021-06-02 MED ORDER — SODIUM CHLORIDE 0.9 % IV SOLN
10.0000 mg | Freq: Once | INTRAVENOUS | Status: AC
  Administered 2021-06-02: 10 mg via INTRAVENOUS
  Filled 2021-06-02: qty 10

## 2021-06-02 MED ORDER — CYANOCOBALAMIN 1000 MCG/ML IJ SOLN
1000.0000 ug | Freq: Once | INTRAMUSCULAR | Status: AC
  Administered 2021-06-02: 1000 ug via INTRAMUSCULAR
  Filled 2021-06-02: qty 1

## 2021-06-02 MED ORDER — TESTOSTERONE CYPIONATE 200 MG/ML IM SOLN
400.0000 mg | Freq: Once | INTRAMUSCULAR | Status: AC
  Administered 2021-06-02: 400 mg via INTRAMUSCULAR
  Filled 2021-06-02: qty 2

## 2021-06-02 NOTE — Telephone Encounter (Signed)
Per 06/02/21 los - gave upcoming appointments - confirmed ?

## 2021-06-02 NOTE — Progress Notes (Signed)
?Hematology and Oncology Follow Up Visit ? ?[name] ?[id number] ?11/25/1950 71 y.o. ?06/02/2021 ? ? ?Principle Diagnosis:  ?IgG kappa myeloma ?Anemia secondary to renal insufficiency ?Intermittent iron - deficiency anemia ?Hypotestosteronemia ?  ?Current Therapy:        ?Aredia 60 mg IV q  month -- ?Retacrit SQ as needed for hemoglobin less than 10 ?DepoTestosterone 400 mg q 4 weeks ?IV iron as indicated ?Kyprolis/Cytoxan - s/p cycle #32 ?  ?Interim History:  [name] is here today for follow-up and treatment.  He has no problems yesterday.  He had to go to the emergency room.  He apparently swallowed a chicken bone accidentally got stuck in his throat.  He said that he underwent upper endoscopy.  From what he told me he said the they could not find anything.  He says he has to go back to the Gastroenterologist on Thursday and have a further evaluation done. ? ?Otherwise, he is about the same.  He has bad arthritis.  This is rheumatoid arthritis.  He is on medication for this. ? ?His myeloma has not been a problem.  There was no monoclonal spike in his blood last time we saw him.  His IgG level was 1053 mg/dL.  The kappa light chain he is doing pretty well.  The change in weather definitely affects his arthritis and fibromyalgia. ? ?Over last saw him, his monoclonal spike was not observed.  There was no increase in the IgG level.  His IgG level was 1090 mg/dL.  The Kappa light chain was was 5.1 mg/dL. ? ?He does have diabetes.  I think this is been doing pretty well. ? ?Of note, his last iron studies back in March showed a ferritin of 1500 with an iron saturation of 31%. ? ?He has had no problems with COVID. ? ?He has had no fever.  He has had no bleeding.  He has had some leg swelling but this is chronic. ? ?He has chronic renal insufficiency. ? ?Overall, his performance status is ECOG 1-2.   ? ? ?Medications:  ?Allergies as of 06/02/2021   ? ?   Reactions  ? Iodinated Contrast Media Rash  ? Patient states  he was instructed not to take IV contrast.  In 2008 he had an unknown reaction, and was told not to take it again.  He was also told not to take it due to his kidneys.  ? Iodine Anxiety, Rash, Other (See Comments)  ? Didn't feel right ?"instructed not to take per MD--something with his port"  ? ?  ? ?  ?Medication List  ?  ? ?  ? Accurate as of June 02, 2021  8:25 AM. If you have any questions, ask your nurse or doctor.  ?  ?  ? ?  ? ?amLODipine 10 MG tablet ?Commonly known as: NORVASC ?Take 10 mg by mouth daily. ?  ?ammonium lactate 12 % lotion ?Commonly known as: AmLactin ?Apply 1 application topically as needed for dry skin. ?  ?atorvastatin 40 MG tablet ?Commonly known as: LIPITOR ?Take 40 mg by mouth daily at 6 PM. ?  ?budesonide-formoterol 160-4.5 MCG/ACT inhaler ?Commonly known as: SYMBICORT ?SMARTSIG:2 Puff(s) By Mouth Twice Daily ?  ?Carafate 1 GM/10ML suspension ?Generic drug: sucralfate ?Take 1 g by mouth 4 (four) times daily. ?  ?carvedilol 25 MG tablet ?Commonly known as: COREG ?Take 25 mg by mouth 2 (two) times daily. ?  ?chlorpheniramine-HYDROcodone 10-8 MG/5ML ?SMARTSIG:5 Milliliter(s) By Mouth Every 12 Hours ?  ?  [employer] Fiber Gummies 2 g Chew ?Chew by mouth daily. ?  ?diazepam 5 MG tablet ?Commonly known as: VALIUM ?Take by mouth at bedtime as needed. ?  ?dicyclomine 10 MG capsule ?Commonly known as: BENTYL ?TAKE 1 CAPSULE (10 MG TOTAL) BY MOUTH 3 (THREE) TIMES DAILY BEFORE MEALS. ?  ?dronabinol 2.5 MG capsule ?Commonly known as: MARINOL ?Take 2.5 mg by mouth 2 (two) times daily before lunch and supper. ?  ?gabapentin 300 MG capsule ?Commonly known as: NEURONTIN ?TAKE 1 CAPSULE BY MOUTH THREE TIMES A DAY ?What changed:  ?how much to take ?how to take this ?when to take this ?additional instructions ?  ?glimepiride 2 MG tablet ?Commonly known as: AMARYL ?Take 2 mg by mouth daily. ?  ?hydrocortisone 2.5 % cream ?Apply topically. ?  ?lactulose 10 GM/15ML solution ?Commonly known as: [place] ?Take 30 mLs  (20 g total) by mouth daily. ?  ?latanoprost 0.005 % ophthalmic solution ?Commonly known as: XALATAN ?Place 1 drop into both eyes at bedtime. ?  ?linaclotide 145 MCG Caps capsule ?Commonly known as: LINZESS ?Take by mouth daily as needed. ?  ?losartan 50 MG tablet ?Commonly known as: COZAAR ?Take 50 mg by mouth daily. ?  ?Lumigan 0.01 % Soln ?Generic drug: bimatoprost ?Place 1 drop into both eyes 3 (three) times daily. ?  ?mesalamine 1.2 g EC tablet ?Commonly known as: LIALDA ?Take by mouth daily with breakfast. ?  ?metoprolol succinate 25 MG 24 hr tablet ?Commonly known as: TOPROL-XL ?Take 25 mg by mouth daily. ?  ?mirabegron ER 25 MG Tb24 tablet ?Commonly known as: MYRBETRIQ ?Take 20 mg by mouth daily. ?  ?morphine 30 MG 12 hr tablet ?Commonly known as: MS CONTIN ?Take 30 mg by mouth 2 (two) times daily. ?  ?omeprazole 40 MG capsule ?Commonly known as: PRILOSEC ?TAKE 1 CAPSULE BY MOUTH TWICE A DAY ?  ?ondansetron 8 MG tablet ?Commonly known as: ZOFRAN ?TAKE 1 TABLET(8 MG) BY MOUTH TWICE DAILY AS NEEDED FOR NAUSEA OR VOMITING ?  ?oxyCODONE 5 MG immediate release tablet ?Commonly known as: Roxicodone ?1 tab PO q6 hours prn pain ?  ?oxyCODONE-acetaminophen 10-325 MG tablet ?Commonly known as: PERCOCET ?Take 1 tablet by mouth every 6 (six) hours as needed for pain. ?  ?pioglitazone 30 MG tablet ?Commonly known as: ACTOS ?Take 30 mg by mouth daily. ?  ?prochlorperazine 10 MG tablet ?Commonly known as: COMPAZINE ?take 1 tablet by mouth every 6 hours if needed for nausea and vomiting ?  ?Restasis 0.05 % ophthalmic emulsion ?Generic drug: cycloSPORINE ?Place 1 drop into both eyes daily. ?  ?SUMAtriptan 100 MG tablet ?Commonly known as: IMITREX ?Take 100 mg by mouth daily. ?  ?tadalafil 10 MG tablet ?Commonly known as: CIALIS ?Take by mouth. ?  ?topiramate 25 MG capsule ?Commonly known as: TOPAMAX ?Take 25 mg by mouth 2 (two) times daily. ?  ?torsemide 20 MG tablet ?Commonly known as: DEMADEX ?Take 40 mg by mouth daily. ?   ?Trulicity 5.80 DX/8.3JA Sopn ?Generic drug: Dulaglutide ?Inject 0.75 mg into the skin every Saturday. ?  ?Vitamin D (Ergocalciferol) 1.25 MG (50000 UNIT) Caps capsule ?Commonly known as: DRISDOL ?Take 50,000 Units by mouth every Saturday. ?  ? ?  ? ? ?Allergies:  ?Allergies  ?Allergen Reactions  ? Iodinated Contrast Media Rash  ?  Patient states he was instructed not to take IV contrast.  In 2008 he had an unknown reaction, and was told not to take it again.  He was also told not  to take it due to his kidneys.  ? Iodine Anxiety, Rash and Other (See Comments)  ?  Didn't feel right ?"instructed not to take per MD--something with his port" ?  ? ? ?Past Medical History, Surgical history, Social history, and Family History were reviewed and updated. ? ?Review of Systems: ?Review of Systems  ?Constitutional:  Positive for malaise/fatigue.  ?HENT: Negative.    ?Eyes: Negative.   ?Respiratory: Negative.    ?Cardiovascular: Negative.   ?Gastrointestinal: Negative.   ?Genitourinary: Negative.   ?Musculoskeletal:  Positive for back pain and joint pain.  ?Skin: Negative.   ?Neurological: Negative.   ?Endo/Heme/Allergies: Negative.   ?Psychiatric/Behavioral: Negative.    ? ? ?Physical Exam: ? weight is 176 lb (79.8 kg). His oral temperature is 98.7 ?F (37.1 ?C). His blood pressure is 127/60 and his pulse is 83. His respiration is 18 and oxygen saturation is 100%.  ? ?Wt Readings from Last 3 Encounters:  ?06/02/21 176 lb (79.8 kg)  ?05/16/21 185 lb (83.9 kg)  ?05/05/21 183 lb 1.9 oz (83.1 kg)  ? ?Physical Exam ?Vitals reviewed.  ?HENT:  ?   Head: Normocephalic and atraumatic.  ?Eyes:  ?   Pupils: Pupils are equal, round, and reactive to light.  ?Cardiovascular:  ?   Rate and Rhythm: Normal rate and regular rhythm.  ?   Heart sounds: Normal heart sounds.  ?Pulmonary:  ?   Effort: Pulmonary effort is normal.  ?   Breath sounds: Normal breath sounds.  ?Abdominal:  ?   General: Bowel sounds are normal.  ?   Palpations: Abdomen is  soft.  ?Musculoskeletal:     ?   General: No tenderness or deformity. Normal range of motion.  ?   Cervical back: Normal range of motion.  ?Lymphadenopathy:  ?   Cervical: No cervical adenopathy.  ?Ski

## 2021-06-02 NOTE — Progress Notes (Signed)
Ok to treat today with creat-2.35 per order of Dr. [name].  ? ?

## 2021-06-03 LAB — TESTOSTERONE: Testosterone: 199 ng/dL — ABNORMAL LOW (ref 264–916)

## 2021-06-03 LAB — PROTEIN ELECTROPHORESIS, SERUM, WITH REFLEX
A/G Ratio: 1.2 (ref 0.7–1.7)
Albumin ELP: 3.5 g/dL (ref 2.9–4.4)
Alpha-1-Globulin: 0.2 g/dL (ref 0.0–0.4)
Alpha-2-Globulin: 0.9 g/dL (ref 0.4–1.0)
Beta Globulin: 0.9 g/dL (ref 0.7–1.3)
Gamma Globulin: 1 g/dL (ref 0.4–1.8)
Globulin, Total: 2.9 g/dL (ref 2.2–3.9)
Total Protein ELP: 6.4 g/dL (ref 6.0–8.5)

## 2021-06-03 LAB — KAPPA/LAMBDA LIGHT CHAINS
Kappa free light chain: 36.2 mg/L — ABNORMAL HIGH (ref 3.3–19.4)
Kappa, lambda light chain ratio: 1.77 — ABNORMAL HIGH (ref 0.26–1.65)
Lambda free light chains: 20.4 mg/L (ref 5.7–26.3)

## 2021-06-03 LAB — IGG, IGA, IGM
IgA: 222 mg/dL (ref 61–437)
IgG (Immunoglobin G), Serum: 951 mg/dL (ref 603–1613)
IgM (Immunoglobulin M), Srm: 39 mg/dL (ref 15–143)

## 2021-06-04 ENCOUNTER — Other Ambulatory Visit: Payer: Self-pay | Admitting: Gastroenterology

## 2021-06-04 ENCOUNTER — Ambulatory Visit (HOSPITAL_COMMUNITY)
Admission: RE | Admit: 2021-06-04 | Discharge: 2021-06-04 | Disposition: A | Discharge status: Home / Self Care | Payer: 59 | Source: Ambulatory Visit | Attending: Gastroenterology | Admitting: Gastroenterology

## 2021-06-04 DIAGNOSIS — R1310 Dysphagia, unspecified: Secondary | ICD-10-CM

## 2021-06-04 DIAGNOSIS — T18128A Food in esophagus causing other injury, initial encounter: Secondary | ICD-10-CM | POA: Insufficient documentation

## 2021-06-06 NOTE — Progress Notes (Deleted)
? ? ?06/06/2021 ?[name] ?[id number] ?Feb 15, 1951 ? ?Referring provider: Elwyn Reach, MD ?Primary GI doctor: Dr. Fuller Plan ? ?ASSESSMENT AND PLAN:  ? ?There are no diagnoses linked to this encounter. ? ? ?History of Present Illness:  ?71 y.o. male  with a past medical history of DM2, CKD4, anemia of chronic disease, IgG k melanoma, IDA, HTN, HLD, anxiety/depression, peripheral neuropathy, chronic constipation, chronic pain syndrome/LBP/fibromyalgia on narcotics, polypharmacy and others listed below, returns to clinic today for evaluation of follow up for food impaction. 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 ? ?Patient was seen by Dr. Lyndel Safe for consultation in the ER 05/16/2021 for food impaction. ?05/16/2021 EGD for history of dysphagia possible food impaction no food impaction was noted mildly tortuous esophagus circulating for his proximal esophagus biopsies obtained rule out EOE.  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  Gastritis, 3 sessile polyps removed gastric body normal duodenum.   ?Pathology: Negative EOE, positive for Barrett's esophagus, negative H. pylori gastritis ?Discharged on pantoprazole 40 mg twice daily.  ?Recommend barium swallow outpatient. ? ?EGD was 10/2014 under procedures tab. ?1. The mucosa of the esophagus appeared normal; esophagus was dilated using a savary dilator 17 mm over guidewire ?2. The mucosa of the stomach appeared normal ?3. The duodenal mucosa showed no abnormalities ? ?Current Medications:  ? ?Current Outpatient Medications (Endocrine & Metabolic):  ?  glimepiride (AMARYL) 2 MG tablet, Take 2 mg by mouth daily. ?  pioglitazone (ACTOS) 30 MG tablet, Take 30 mg by mouth daily. ?  TRULICITY 8.41 YS/0.6TK SOPN, Inject 0.75 mg into the skin every Saturday.  ? ? ?Current Outpatient Medications (Cardiovascular):  ?  amLODipine (NORVASC) 10 MG tablet, Take 10 mg by mouth daily. ?  atorvastatin (LIPITOR) 40 MG tablet, Take 40 mg by  mouth daily at 6 PM.  ?  carvedilol (COREG) 25 MG tablet, Take 25 mg by mouth 2 (two) times daily. ?  losartan (COZAAR) 50 MG tablet, Take 50 mg by mouth daily. ?  metoprolol succinate (TOPROL-XL) 25 MG 24 hr tablet, Take 25 mg by mouth daily. ?  tadalafil (CIALIS) 10 MG tablet, Take by mouth. ?  torsemide (DEMADEX) 20 MG tablet, Take 40 mg by mouth daily.  ? ? ?Facility-Administered Medications Ordered in Other Visits (Cardiovascular):  ?  furosemide (LASIX) injection 40 mg ? ?Current Outpatient Medications (Respiratory):  ?  budesonide-formoterol (SYMBICORT) 160-4.5 MCG/ACT inhaler, SMARTSIG:2 Puff(s) By Mouth Twice Daily ?  chlorpheniramine-HYDROcodone 10-8 MG/5ML, SMARTSIG:5 Milliliter(s) By Mouth Every 12 Hours ? ? ?Current Outpatient Medications (Analgesics):  ?  morphine (MS CONTIN) 30 MG 12 hr tablet, Take 30 mg by mouth 2 (two) times daily. ?  oxyCODONE (ROXICODONE) 5 MG immediate release tablet, 1 tab PO q6 hours prn pain ?  oxyCODONE-acetaminophen (PERCOCET) 10-325 MG tablet, Take 1 tablet by mouth every 6 (six) hours as needed for pain. ?  SUMAtriptan (IMITREX) 100 MG tablet, Take 100 mg by mouth daily. ? ? ?Facility-Administered Medications Ordered in Other Visits (Analgesics):  ?  HYDROmorphone (DILAUDID) 1 MG/ML injection ?  morphine 4 MG/ML injection 4 mg ? ? ? ?Current Outpatient Medications (Other):  ?  ammonium lactate (AMLACTIN) 12 % lotion, Apply 1 application topically as needed for dry skin. ?  CARAFATE 1 GM/10ML suspension, Take 1 g by mouth 4 (four) times daily.  ?  [employer] Fiber Gummies 2 g CHEW, Chew by mouth daily. ?  diazepam (VALIUM) 5 MG tablet, Take by mouth at  bedtime as needed. ?  dicyclomine (BENTYL) 10 MG capsule, TAKE 1 CAPSULE (10 MG TOTAL) BY MOUTH 3 (THREE) TIMES DAILY BEFORE MEALS. ?  dronabinol (MARINOL) 2.5 MG capsule, Take 2.5 mg by mouth 2 (two) times daily before lunch and supper. ?  gabapentin (NEURONTIN) 300 MG capsule, TAKE 1 CAPSULE BY MOUTH THREE TIMES A DAY (Patient  taking differently: Take 300 mg by mouth 3 (three) times daily.) ?  hydrocortisone 2.5 % cream, Apply topically. ?  lactulose (CHRONULAC) 10 GM/15ML solution, Take 30 mLs (20 g total) by mouth daily. ?  latanoprost (XALATAN) 0.005 % ophthalmic solution, Place 1 drop into both eyes at bedtime. ?  linaclotide (LINZESS) 145 MCG CAPS capsule, Take by mouth daily as needed. ?  LUMIGAN 0.01 % SOLN, Place 1 drop into both eyes 3 (three) times daily. ?  mesalamine (LIALDA) 1.2 g EC tablet, Take by mouth daily with breakfast. ?  mirabegron ER (MYRBETRIQ) 25 MG TB24 tablet, Take 20 mg by mouth daily. ?  omeprazole (PRILOSEC) 40 MG capsule, TAKE 1 CAPSULE BY MOUTH TWICE A DAY ?  ondansetron (ZOFRAN) 8 MG tablet, TAKE 1 TABLET(8 MG) BY MOUTH TWICE DAILY AS NEEDED FOR NAUSEA OR VOMITING ?  prochlorperazine (COMPAZINE) 10 MG tablet, take 1 tablet by mouth every 6 hours if needed for nausea and vomiting ?  RESTASIS 0.05 % ophthalmic emulsion, Place 1 drop into both eyes daily.  ?  topiramate (TOPAMAX) 25 MG capsule, Take 25 mg by mouth 2 (two) times daily. ?  Vitamin D, Ergocalciferol, (DRISDOL) 50000 UNITS CAPS capsule, Take 50,000 Units by mouth every Saturday.  ? ? ?Facility-Administered Medications Ordered in Other Visits (Other):  ?  0.9 %  sodium chloride infusion ?  sodium chloride flush (NS) 0.9 % injection 10 mL ?No current facility-administered medications for this visit. ? ?Surgical History:  ?He  has a past surgical history that includes Knee surgery; Knee arthroscopy; Portacath placement; Cataract extraction; Transurethral resection of prostate (01/2011); Colonoscopy; Esophagogastroduodenoscopy; Colonoscopy (07/13/2011); Tonsillectomy; Posterior cervical fusion/foraminotomy (N/A, 06/05/2016); IR Chest Fluoro (09/22/2016); IR FLUORO GUIDE PORT INSERTION RIGHT (09/23/2016); IR REMOVAL TUN ACCESS W/ PORT W/O FL MOD SED (09/23/2016); IR US Guide Vasc Access Left (09/23/2016); Nevus excision (N/A, 08/29/2018); Cyst excision (Right,  08/29/2018); IR Chest Fluoro (04/25/2020); Mass excision (Right, 05/09/2020); Esophagogastroduodenoscopy (egd) with propofol (N/A, 05/16/2021); polypectomy (05/16/2021); biopsy (05/16/2021); and Balloon dilation (N/A, 05/16/2021). ?Family History:  ?His family history includes Bone cancer in his father; Breast cancer in his mother and sister; Diabetes in his sister; Heart disease in his daughter, mother, and sister; Stomach cancer in his brother; Throat cancer in his brother. ?Social History:  ? reports that he has never smoked. He has never used smokeless tobacco. He reports that he does not currently use alcohol. He reports that he does not currently use drugs. ? ?Current Medications, Allergies, Past Medical History, Past Surgical History, Family History and Social History were reviewed in [employer] record. ? ?Physical Exam: ?There were no vitals taken for this visit. ?General:   Pleasant, well developed male in no acute distress ?Heart:  Regular rate and rhythm; no murmurs ?Pulm: Clear anteriorly; no wheezing ?Abdomen:  {BlankSingle:19197::"Distended","Ridged","Soft"}, {BlankSingle:19197::"Flat","Obese","Non-distended"} AB, {BlankSingle:19197::"Absent","Hyperactive, tinkling","Hypoactive","Sluggish","Active"} bowel sounds. {actendernessAB:27319} tenderness {anatomy; site abdomen:5010}. {BlankMultiple:19196::"Without guarding","With guarding","Without rebound","With rebound"}, No organomegaly appreciated. ?Extremities:  {With/without:5700}  edema. ?Neurologic:  Alert and  oriented x4;  No focal deficits.  ?Psych:  Cooperative. Normal mood and affect. ? ? ?Vladimir Crofts, PA-C ?06/06/21 ?

## 2021-06-09 ENCOUNTER — Ambulatory Visit: Payer: 59 | Admitting: Physician Assistant

## 2021-06-21 ENCOUNTER — Other Ambulatory Visit: Payer: Self-pay | Admitting: Hematology & Oncology

## 2021-06-21 DIAGNOSIS — R1319 Other dysphagia: Secondary | ICD-10-CM

## 2021-06-21 DIAGNOSIS — R1084 Generalized abdominal pain: Secondary | ICD-10-CM

## 2021-06-30 ENCOUNTER — Inpatient Hospital Stay (HOSPITAL_BASED_OUTPATIENT_CLINIC_OR_DEPARTMENT_OTHER): Payer: 59 | Admitting: Hematology & Oncology

## 2021-06-30 ENCOUNTER — Inpatient Hospital Stay: Payer: 59

## 2021-06-30 ENCOUNTER — Encounter: Payer: Self-pay | Admitting: Hematology & Oncology

## 2021-06-30 ENCOUNTER — Inpatient Hospital Stay: Payer: 59 | Attending: Hematology & Oncology

## 2021-06-30 VITALS — BP 119/61 | HR 72 | Temp 98.4°F | Resp 20 | Wt 185.1 lb

## 2021-06-30 DIAGNOSIS — Z5112 Encounter for antineoplastic immunotherapy: Secondary | ICD-10-CM | POA: Diagnosis present

## 2021-06-30 DIAGNOSIS — C9002 Multiple myeloma in relapse: Secondary | ICD-10-CM | POA: Diagnosis not present

## 2021-06-30 DIAGNOSIS — D631 Anemia in chronic kidney disease: Secondary | ICD-10-CM | POA: Diagnosis not present

## 2021-06-30 DIAGNOSIS — C9000 Multiple myeloma not having achieved remission: Secondary | ICD-10-CM | POA: Insufficient documentation

## 2021-06-30 DIAGNOSIS — N184 Chronic kidney disease, stage 4 (severe): Secondary | ICD-10-CM | POA: Insufficient documentation

## 2021-06-30 DIAGNOSIS — E349 Endocrine disorder, unspecified: Secondary | ICD-10-CM

## 2021-06-30 DIAGNOSIS — D500 Iron deficiency anemia secondary to blood loss (chronic): Secondary | ICD-10-CM

## 2021-06-30 DIAGNOSIS — Z299 Encounter for prophylactic measures, unspecified: Secondary | ICD-10-CM

## 2021-06-30 DIAGNOSIS — E230 Hypopituitarism: Secondary | ICD-10-CM | POA: Insufficient documentation

## 2021-06-30 DIAGNOSIS — Z5111 Encounter for antineoplastic chemotherapy: Secondary | ICD-10-CM | POA: Insufficient documentation

## 2021-06-30 DIAGNOSIS — M4712 Other spondylosis with myelopathy, cervical region: Secondary | ICD-10-CM

## 2021-06-30 LAB — CMP (CANCER CENTER ONLY)
ALT: 11 U/L (ref 0–44)
AST: 18 U/L (ref 15–41)
Albumin: 4 g/dL (ref 3.5–5.0)
Alkaline Phosphatase: 74 U/L (ref 38–126)
Anion gap: 8 (ref 5–15)
BUN: 31 mg/dL — ABNORMAL HIGH (ref 8–23)
CO2: 30 mmol/L (ref 22–32)
Calcium: 8.6 mg/dL — ABNORMAL LOW (ref 8.9–10.3)
Chloride: 103 mmol/L (ref 98–111)
Creatinine: 2.84 mg/dL — ABNORMAL HIGH (ref 0.61–1.24)
GFR, Estimated: 23 mL/min — ABNORMAL LOW (ref >60)
Glucose, Bld: 115 mg/dL — ABNORMAL HIGH (ref 70–99)
Potassium: 4 mmol/L (ref 3.5–5.1)
Sodium: 141 mmol/L (ref 135–145)
Total Bilirubin: 0.3 mg/dL (ref 0.3–1.2)
Total Protein: 6.5 g/dL (ref 6.5–8.1)

## 2021-06-30 LAB — CBC WITH DIFFERENTIAL (CANCER CENTER ONLY)
Abs Immature Granulocytes: 0.02 10*3/uL (ref 0.00–0.07)
Basophils Absolute: 0.1 10*3/uL (ref 0.0–0.1)
Basophils Relative: 1 %
Eosinophils Absolute: 0.2 10*3/uL (ref 0.0–0.5)
Eosinophils Relative: 4 %
HCT: 28.7 % — ABNORMAL LOW (ref 39.0–52.0)
Hemoglobin: 9.2 g/dL — ABNORMAL LOW (ref 13.0–17.0)
Immature Granulocytes: 1 %
Lymphocytes Relative: 20 %
Lymphs Abs: 0.8 10*3/uL (ref 0.7–4.0)
MCH: 25.1 pg — ABNORMAL LOW (ref 26.0–34.0)
MCHC: 32.1 g/dL (ref 30.0–36.0)
MCV: 78.2 fL — ABNORMAL LOW (ref 80.0–100.0)
Monocytes Absolute: 0.6 10*3/uL (ref 0.1–1.0)
Monocytes Relative: 16 %
Neutro Abs: 2.3 10*3/uL (ref 1.7–7.7)
Neutrophils Relative %: 58 %
Platelet Count: 157 10*3/uL (ref 150–400)
RBC: 3.67 MIL/uL — ABNORMAL LOW (ref 4.22–5.81)
RDW: 15.9 % — ABNORMAL HIGH (ref 11.5–15.5)
WBC Count: 4 10*3/uL (ref 4.0–10.5)
nRBC: 0 % (ref 0.0–0.2)

## 2021-06-30 LAB — IRON AND IRON BINDING CAPACITY (CC-WL,HP ONLY)
Iron: 98 ug/dL (ref 45–182)
Saturation Ratios: 42 % — ABNORMAL HIGH (ref 17.9–39.5)
TIBC: 231 ug/dL — ABNORMAL LOW (ref 250–450)
UIBC: 133 ug/dL (ref 117–376)

## 2021-06-30 LAB — RETICULOCYTES
Immature Retic Fract: 12.4 % (ref 2.3–15.9)
RBC.: 3.62 MIL/uL — ABNORMAL LOW (ref 4.22–5.81)
Retic Count, Absolute: 27.9 10*3/uL (ref 19.0–186.0)
Retic Ct Pct: 0.8 % (ref 0.4–3.1)

## 2021-06-30 LAB — LACTATE DEHYDROGENASE: LDH: 188 U/L (ref 98–192)

## 2021-06-30 LAB — FERRITIN: Ferritin: 1291 ng/mL — ABNORMAL HIGH (ref 24–336)

## 2021-06-30 MED ORDER — CYANOCOBALAMIN 1000 MCG/ML IJ SOLN
1000.0000 ug | Freq: Once | INTRAMUSCULAR | Status: AC
  Administered 2021-06-30: 1000 ug via INTRAMUSCULAR
  Filled 2021-06-30: qty 1

## 2021-06-30 MED ORDER — SODIUM CHLORIDE 0.9 % IV SOLN: Freq: Once | INTRAVENOUS | Status: AC

## 2021-06-30 MED ORDER — HEPARIN SOD (PORK) LOCK FLUSH 100 UNIT/ML IV SOLN
500.0000 [IU] | Freq: Once | INTRAVENOUS | Status: AC | PRN
  Administered 2021-06-30: 500 [IU]

## 2021-06-30 MED ORDER — PALONOSETRON HCL INJECTION 0.25 MG/5ML
0.2500 mg | Freq: Once | INTRAVENOUS | Status: AC
  Administered 2021-06-30: 0.25 mg via INTRAVENOUS
  Filled 2021-06-30: qty 5

## 2021-06-30 MED ORDER — DEXTROSE 5 % IV SOLN
28.6000 mg/m2 | Freq: Once | INTRAVENOUS | Status: AC
  Administered 2021-06-30: 60 mg via INTRAVENOUS
  Filled 2021-06-30: qty 30

## 2021-06-30 MED ORDER — SODIUM CHLORIDE 0.9 % IV SOLN
300.0000 mg/m2 | Freq: Once | INTRAVENOUS | Status: AC
  Administered 2021-06-30: 620 mg via INTRAVENOUS
  Filled 2021-06-30: qty 31

## 2021-06-30 MED ORDER — SODIUM CHLORIDE 0.9% FLUSH
10.0000 mL | INTRAVENOUS | Status: DC | PRN
  Administered 2021-06-30: 10 mL

## 2021-06-30 MED ORDER — SODIUM CHLORIDE 0.9 % IV SOLN
10.0000 mg | Freq: Once | INTRAVENOUS | Status: AC
  Administered 2021-06-30: 10 mg via INTRAVENOUS
  Filled 2021-06-30: qty 10

## 2021-06-30 MED ORDER — SODIUM CHLORIDE 0.9 % IV SOLN: 60.0000 mg | Freq: Once | INTRAVENOUS | Status: DC

## 2021-06-30 MED ORDER — EPOETIN ALFA-EPBX 40000 UNIT/ML IJ SOLN
40000.0000 [IU] | Freq: Once | INTRAMUSCULAR | Status: AC
  Administered 2021-06-30: 40000 [IU] via SUBCUTANEOUS
  Filled 2021-06-30: qty 1

## 2021-06-30 MED ORDER — HYDROMORPHONE HCL 1 MG/ML IJ SOLN
2.0000 mg | Freq: Once | INTRAMUSCULAR | Status: AC
  Administered 2021-06-30: 2 mg via INTRAVENOUS
  Filled 2021-06-30: qty 2

## 2021-06-30 MED ORDER — TESTOSTERONE CYPIONATE 200 MG/ML IM SOLN
400.0000 mg | Freq: Once | INTRAMUSCULAR | Status: AC
  Administered 2021-06-30: 400 mg via INTRAMUSCULAR
  Filled 2021-06-30: qty 2

## 2021-06-30 MED ORDER — SODIUM CHLORIDE 0.9 % IV SOLN: Freq: Once | INTRAVENOUS | Status: DC

## 2021-06-30 NOTE — Patient Instructions (Signed)

## 2021-06-30 NOTE — Patient Instructions (Signed)
[name]  Discharge Instructions: ?Thank you for choosing [facility] to provide your oncology and hematology care.  ? ?If you have a lab appointment with the [facility], please go directly to the [facility] and check in at the registration area. ? ?Wear comfortable clothing and clothing appropriate for easy access to any Portacath or PICC line.  ? ?We strive to give you quality time with your provider. You may need to reschedule your appointment if you arrive late (15 or more minutes).  Arriving late affects you and other patients whose appointments are after yours.  Also, if you miss three or more appointments without notifying the office, you may be dismissed from the clinic at the provider?s discretion.    ?  ?For prescription refill requests, have your pharmacy contact our office and allow 72 hours for refills to be completed.   ? ?Today you received the following chemotherapy and/or immunotherapy agents Kyprolis, cytoxan.    ?  ?To help prevent nausea and vomiting after your treatment, we encourage you to take your nausea medication as directed. ? ?BELOW ARE SYMPTOMS THAT SHOULD BE REPORTED IMMEDIATELY: ?*FEVER GREATER THAN 100.4 F (38 ?C) OR HIGHER ?*CHILLS OR SWEATING ?*NAUSEA AND VOMITING THAT IS NOT CONTROLLED WITH YOUR NAUSEA MEDICATION ?*UNUSUAL SHORTNESS OF BREATH ?*UNUSUAL BRUISING OR BLEEDING ?*URINARY PROBLEMS (pain or burning when urinating, or frequent urination) ?*BOWEL PROBLEMS (unusual diarrhea, constipation, pain near the anus) ?TENDERNESS IN MOUTH AND THROAT WITH OR WITHOUT PRESENCE OF ULCERS (sore throat, sores in mouth, or a toothache) ?UNUSUAL RASH, SWELLING OR PAIN  ?UNUSUAL VAGINAL DISCHARGE OR ITCHING  ? ?Items with * indicate a potential emergency and should be followed up as soon as possible or go to the Emergency Department if any problems should occur. ? ?Please show the CHEMOTHERAPY ALERT CARD or IMMUNOTHERAPY ALERT CARD at check-in  to the Emergency Department and triage nurse. ?Should you have questions after your visit or need to cancel or reschedule your appointment, please contact [facility]  [phone] and follow the prompts.  Office hours are 8:00 a.m. to 4:30 p.m. Monday - Friday. Please note that voicemails left after 4:00 p.m. may not be returned until the following business day.  We are closed weekends and major holidays. You have access to a nurse at all times for urgent questions. Please call the main number to the clinic [phone] and follow the prompts. ? ?For any non-urgent questions, you may also contact your provider using MyChart. We now offer e-Visits for anyone 68 and older to request care online for non-urgent symptoms. For details visit [email or web address]. ?  ?Also download the MyChart app! Go to the app store, search "MyChart", open the app, select [facility], and log in with your MyChart username and password. ? ?Due to Covid, a mask is required upon entering the hospital/clinic. If you do not have a mask, one will be given to you upon arrival. For doctor visits, patients may have 1 support person aged 63 or older with them. For treatment visits, patients cannot have anyone with them due to current Covid guidelines and our immunocompromised population.  ?

## 2021-06-30 NOTE — Progress Notes (Signed)
?Hematology and Oncology Follow Up Visit ? ?[name] ?[id number] ?02-07-1951 71 y.o. ?06/30/2021 ? ? ?Principle Diagnosis:  ?IgG kappa myeloma ?Anemia secondary to renal insufficiency ?Intermittent iron - deficiency anemia ?Hypotestosteronemia ?  ?Current Therapy:        ?Aredia 60 mg IV q  month -- ?Retacrit SQ as needed for hemoglobin less than 10 ?DepoTestosterone 400 mg q 4 weeks ?IV iron as indicated ?Kyprolis/Cytoxan - s/p cycle #32 ?  ?Interim History:  [name] is here today for follow-up and treatment.  He does feel little bit cold.  His hemoglobin is down to 9.2.  We will have to give him some Retacrit today. ? ?His myeloma has been doing nicely.  There was no monoclonal spike in his blood last time that we saw him.  His Kappa light chain was 3.6 mg/dL.  His IgG level was 951 mg/dL. ? ?When we last saw him, his ferritin was 1174.  His iron saturation was 36%.  A lot of the ferritin elevation is secondary to his arthritis. ? ?He is trying to watch his blood sugars.  He is trying to stay active.  Again, his arthritis does tend to bother him. ? ?I must say that this probably is best I have seen him look. ? ?I forgot to mention that he is on supplemental testosterone.  His last level back in April was 199. ? ?Overall, I would say his performance status is probably ECOG 1. ?Medications:  ?Allergies as of 06/30/2021   ? ?   Reactions  ? Iodinated Contrast Media Rash  ? Patient states he was instructed not to take IV contrast.  In 2008 he had an unknown reaction, and was told not to take it again.  He was also told not to take it due to his kidneys.  ? Iodine Anxiety, Rash, Other (See Comments)  ? Didn't feel right ?"instructed not to take per MD--something with his port"  ? ?  ? ?  ?Medication List  ?  ? ?  ? Accurate as of Jun 30, 2021  8:50 AM. If you have any questions, ask your nurse or doctor.  ?  ?  ? ?  ? ?amLODipine 10 MG tablet ?Commonly known as: NORVASC ?Take 10 mg by mouth daily. ?  ?ammonium  lactate 12 % lotion ?Commonly known as: AmLactin ?Apply 1 application topically as needed for dry skin. ?  ?atorvastatin 40 MG tablet ?Commonly known as: LIPITOR ?Take 40 mg by mouth daily at 6 PM. ?  ?budesonide-formoterol 160-4.5 MCG/ACT inhaler ?Commonly known as: SYMBICORT ?SMARTSIG:2 Puff(s) By Mouth Twice Daily ?  ?Carafate 1 GM/10ML suspension ?Generic drug: sucralfate ?Take 1 g by mouth 4 (four) times daily. ?  ?carvedilol 25 MG tablet ?Commonly known as: COREG ?Take 25 mg by mouth 2 (two) times daily. ?  ?chlorpheniramine-HYDROcodone 10-8 MG/5ML ?SMARTSIG:5 Milliliter(s) By Mouth Every 12 Hours ?  ?[employer] Fiber Gummies 2 g Chew ?Chew by mouth daily. ?  ?diazepam 5 MG tablet ?Commonly known as: VALIUM ?Take by mouth at bedtime as needed. ?  ?dicyclomine 10 MG capsule ?Commonly known as: BENTYL ?TAKE 1 CAPSULE BY MOUTH 3 TIMES A DAY BEFORE MEALS ?  ?dronabinol 2.5 MG capsule ?Commonly known as: MARINOL ?Take 2.5 mg by mouth 2 (two) times daily before lunch and supper. ?  ?gabapentin 300 MG capsule ?Commonly known as: NEURONTIN ?TAKE 1 CAPSULE BY MOUTH THREE TIMES A DAY ?What changed:  ?how much to take ?how to take this ?when to  take this ?additional instructions ?  ?glimepiride 2 MG tablet ?Commonly known as: AMARYL ?Take 2 mg by mouth daily. ?  ?hydrocortisone 2.5 % cream ?Apply topically 2 (two) times daily. ?  ?lactulose 10 GM/15ML solution ?Commonly known as: Rivanna ?Take 30 mLs (20 g total) by mouth daily. ?  ?latanoprost 0.005 % ophthalmic solution ?Commonly known as: XALATAN ?Place 1 drop into both eyes at bedtime. ?  ?linaclotide 145 MCG Caps capsule ?Commonly known as: LINZESS ?Take by mouth daily as needed. ?  ?losartan 50 MG tablet ?Commonly known as: COZAAR ?Take 50 mg by mouth daily. ?  ?Lumigan 0.01 % Soln ?Generic drug: bimatoprost ?Place 1 drop into both eyes 3 (three) times daily. ?  ?mesalamine 1.2 g EC tablet ?Commonly known as: LIALDA ?Take by mouth daily with breakfast. ?  ?metoprolol  succinate 25 MG 24 hr tablet ?Commonly known as: TOPROL-XL ?Take 25 mg by mouth daily. ?  ?mirabegron ER 25 MG Tb24 tablet ?Commonly known as: MYRBETRIQ ?Take 20 mg by mouth daily. ?  ?morphine 30 MG 12 hr tablet ?Commonly known as: MS CONTIN ?Take 30 mg by mouth 2 (two) times daily. ?  ?omeprazole 40 MG capsule ?Commonly known as: PRILOSEC ?TAKE 1 CAPSULE BY MOUTH TWICE A DAY ?  ?ondansetron 8 MG tablet ?Commonly known as: ZOFRAN ?TAKE 1 TABLET(8 MG) BY MOUTH TWICE DAILY AS NEEDED FOR NAUSEA OR VOMITING ?  ?oxyCODONE 5 MG immediate release tablet ?Commonly known as: Roxicodone ?1 tab PO q6 hours prn pain ?  ?oxyCODONE-acetaminophen 10-325 MG tablet ?Commonly known as: PERCOCET ?Take 1 tablet by mouth every 6 (six) hours as needed for pain. ?  ?pioglitazone 30 MG tablet ?Commonly known as: ACTOS ?Take 30 mg by mouth daily. ?  ?polyethylene glycol powder 17 GM/SCOOP powder ?Commonly known as: GLYCOLAX/MIRALAX ?SMARTSIG:1 scoopful By Mouth Twice Daily PRN ?  ?prochlorperazine 10 MG tablet ?Commonly known as: COMPAZINE ?take 1 tablet by mouth every 6 hours if needed for nausea and vomiting ?  ?Restasis 0.05 % ophthalmic emulsion ?Generic drug: cycloSPORINE ?Place 1 drop into both eyes daily. ?  ?SUMAtriptan 100 MG tablet ?Commonly known as: IMITREX ?Take 100 mg by mouth daily. ?  ?tadalafil 10 MG tablet ?Commonly known as: CIALIS ?Take by mouth. ?  ?topiramate 25 MG capsule ?Commonly known as: TOPAMAX ?Take 25 mg by mouth 2 (two) times daily. ?  ?torsemide 20 MG tablet ?Commonly known as: DEMADEX ?Take 20 mg by mouth daily. ?  ?Trulicity 5.73 UK/0.2RK Sopn ?Generic drug: Dulaglutide ?Inject 0.75 mg into the skin every Saturday. ?  ?Vitamin D (Ergocalciferol) 1.25 MG (50000 UNIT) Caps capsule ?Commonly known as: DRISDOL ?Take 50,000 Units by mouth every Saturday. ?  ? ?  ? ? ?Allergies:  ?Allergies  ?Allergen Reactions  ? Iodinated Contrast Media Rash  ?  Patient states he was instructed not to take IV contrast.  In  2008 he had an unknown reaction, and was told not to take it again.  He was also told not to take it due to his kidneys.  ? Iodine Anxiety, Rash and Other (See Comments)  ?  Didn't feel right ?"instructed not to take per MD--something with his port" ?  ? ? ?Past Medical History, Surgical history, Social history, and Family History were reviewed and updated. ? ?Review of Systems: ?Review of Systems  ?Constitutional:  Positive for malaise/fatigue.  ?HENT: Negative.    ?Eyes: Negative.   ?Respiratory: Negative.    ?Cardiovascular: Negative.   ?Gastrointestinal: Negative.   ?  Genitourinary: Negative.   ?Musculoskeletal:  Positive for back pain and joint pain.  ?Skin: Negative.   ?Neurological: Negative.   ?Endo/Heme/Allergies: Negative.   ?Psychiatric/Behavioral: Negative.    ? ? ?Physical Exam: ? weight is 185 lb 0.8 oz (83.9 kg). His oral temperature is 98.4 ?F (36.9 ?C). His blood pressure is 119/61 and his pulse is 72. His respiration is 20 and oxygen saturation is 100%.  ? ?Wt Readings from Last 3 Encounters:  ?06/30/21 185 lb 0.8 oz (83.9 kg)  ?06/02/21 176 lb (79.8 kg)  ?05/16/21 185 lb (83.9 kg)  ? ?Physical Exam ?Vitals reviewed.  ?HENT:  ?   Head: Normocephalic and atraumatic.  ?Eyes:  ?   Pupils: Pupils are equal, round, and reactive to light.  ?Cardiovascular:  ?   Rate and Rhythm: Normal rate and regular rhythm.  ?   Heart sounds: Normal heart sounds.  ?Pulmonary:  ?   Effort: Pulmonary effort is normal.  ?   Breath sounds: Normal breath sounds.  ?Abdominal:  ?   General: Bowel sounds are normal.  ?   Palpations: Abdomen is soft.  ?Musculoskeletal:     ?   General: No tenderness or deformity. Normal range of motion.  ?   Cervical back: Normal range of motion.  ?Lymphadenopathy:  ?   Cervical: No cervical adenopathy.  ?Skin: ?   General: Skin is warm and dry.  ?   Findings: No erythema or rash.  ?Neurological:  ?   Mental Status: He is alert and oriented to person, place, and time.  ?Psychiatric:     ?    Behavior: Behavior normal.     ?   Thought Content: Thought content normal.     ?   Judgment: Judgment normal.  ? ? ?Lab Results  ?Component Value Date  ? WBC 4.0 06/30/2021  ? HGB 9.2 (L) 06/30/2021  ? HCT 28.7 (

## 2021-07-01 LAB — KAPPA/LAMBDA LIGHT CHAINS
Kappa free light chain: 48.9 mg/L — ABNORMAL HIGH (ref 3.3–19.4)
Kappa, lambda light chain ratio: 2.08 — ABNORMAL HIGH (ref 0.26–1.65)
Lambda free light chains: 23.5 mg/L (ref 5.7–26.3)

## 2021-07-01 LAB — IGG, IGA, IGM
IgA: 221 mg/dL (ref 61–437)
IgG (Immunoglobin G), Serum: 925 mg/dL (ref 603–1613)
IgM (Immunoglobulin M), Srm: 32 mg/dL (ref 15–143)

## 2021-07-01 LAB — TESTOSTERONE: Testosterone: 238 ng/dL — ABNORMAL LOW (ref 264–916)

## 2021-07-02 LAB — PROTEIN ELECTROPHORESIS, SERUM, WITH REFLEX
A/G Ratio: 1.3 (ref 0.7–1.7)
Albumin ELP: 3.7 g/dL (ref 2.9–4.4)
Alpha-1-Globulin: 0.2 g/dL (ref 0.0–0.4)
Alpha-2-Globulin: 0.9 g/dL (ref 0.4–1.0)
Beta Globulin: 0.8 g/dL (ref 0.7–1.3)
Gamma Globulin: 0.9 g/dL (ref 0.4–1.8)
Globulin, Total: 2.8 g/dL (ref 2.2–3.9)
Total Protein ELP: 6.5 g/dL (ref 6.0–8.5)

## 2021-07-04 ENCOUNTER — Encounter: Payer: Self-pay | Admitting: Podiatry

## 2021-07-04 ENCOUNTER — Ambulatory Visit (INDEPENDENT_AMBULATORY_CARE_PROVIDER_SITE_OTHER): Payer: 59 | Admitting: Podiatry

## 2021-07-04 DIAGNOSIS — M79675 Pain in left toe(s): Secondary | ICD-10-CM

## 2021-07-04 DIAGNOSIS — M79674 Pain in right toe(s): Secondary | ICD-10-CM | POA: Diagnosis not present

## 2021-07-04 DIAGNOSIS — E1151 Type 2 diabetes mellitus with diabetic peripheral angiopathy without gangrene: Secondary | ICD-10-CM

## 2021-07-04 DIAGNOSIS — L84 Corns and callosities: Secondary | ICD-10-CM

## 2021-07-04 DIAGNOSIS — M2010 Hallux valgus (acquired), unspecified foot: Secondary | ICD-10-CM

## 2021-07-04 DIAGNOSIS — B351 Tinea unguium: Secondary | ICD-10-CM | POA: Diagnosis not present

## 2021-07-04 NOTE — Progress Notes (Signed)
This patient returns to my office for at risk foot care.  This patient requires this care by a professional since this patient will be at risk due to having diabetes type 2.  This patient is unable to cut nails himself since the patient cannot reach his nails.These nails are painful walking and wearing shoes.  Painful callus under outside ball of both feet.This patient presents for at risk foot care today. ? ?General Appearance  Alert, conversant and in no acute stress. ? ?Vascular  Dorsalis pedis and posterior tibial  pulses are weakly  palpable  bilaterally.  Capillary return is within normal limits  bilaterally. Cold feet bilaterally.  Absent digital hair. ? ?Neurologic  Senn-Weinstein monofilament wire test within normal limits/diminished right.  LOPS absent left foot. Muscle power within normal limits bilaterally. ? ?Nails Thick disfigured discolored nails with subungual debris  from hallux to fifth toes bilaterally. No evidence of bacterial infection or drainage bilaterally. ? ?Orthopedic  No limitations of motion  feet .  No crepitus or effusions noted.  No bony pathology or digital deformities noted.  HAV  B/L.  Exostosis right foot.Plantar flexed fifth metatarsall B/l. ? ?Skin  normotropic skin with no porokeratosis noted bilaterally.  No signs of infections or ulcers noted.  Callus sub 5th met  B/l. No infection. ? ?Onychomycosis  Pain in right toes  Pain in left toes  Porokeratosis  B/L ? ?Consent was obtained for treatment procedures.   Mechanical debridement of nails 1-5  bilaterally performed with a nail nipper.  Filed with dremel without incident. Debride callus sub 5th  B/L ? ? ?Return office visit    10 weeks                  Told patient to return for periodic foot care and evaluation due to potential at risk complications. ? ? ?Gardiner Barefoot DPM  ?

## 2021-07-15 ENCOUNTER — Encounter: Payer: Self-pay | Admitting: Gastroenterology

## 2021-07-30 ENCOUNTER — Inpatient Hospital Stay (HOSPITAL_BASED_OUTPATIENT_CLINIC_OR_DEPARTMENT_OTHER): Payer: 59 | Admitting: Family

## 2021-07-30 ENCOUNTER — Encounter: Payer: Self-pay | Admitting: Family

## 2021-07-30 ENCOUNTER — Ambulatory Visit: Payer: 59 | Admitting: Hematology & Oncology

## 2021-07-30 ENCOUNTER — Other Ambulatory Visit: Payer: 59

## 2021-07-30 ENCOUNTER — Inpatient Hospital Stay: Payer: 59

## 2021-07-30 ENCOUNTER — Ambulatory Visit: Payer: 59

## 2021-07-30 ENCOUNTER — Inpatient Hospital Stay: Payer: 59 | Attending: Hematology & Oncology

## 2021-07-30 ENCOUNTER — Other Ambulatory Visit: Payer: Self-pay | Admitting: Family

## 2021-07-30 VITALS — BP 133/65 | HR 68 | Temp 98.2°F | Resp 18 | Wt 181.1 lb

## 2021-07-30 DIAGNOSIS — Z299 Encounter for prophylactic measures, unspecified: Secondary | ICD-10-CM

## 2021-07-30 DIAGNOSIS — D631 Anemia in chronic kidney disease: Secondary | ICD-10-CM | POA: Insufficient documentation

## 2021-07-30 DIAGNOSIS — D500 Iron deficiency anemia secondary to blood loss (chronic): Secondary | ICD-10-CM

## 2021-07-30 DIAGNOSIS — N189 Chronic kidney disease, unspecified: Secondary | ICD-10-CM | POA: Diagnosis not present

## 2021-07-30 DIAGNOSIS — D510 Vitamin B12 deficiency anemia due to intrinsic factor deficiency: Secondary | ICD-10-CM

## 2021-07-30 DIAGNOSIS — M4712 Other spondylosis with myelopathy, cervical region: Secondary | ICD-10-CM

## 2021-07-30 DIAGNOSIS — Z5111 Encounter for antineoplastic chemotherapy: Secondary | ICD-10-CM | POA: Diagnosis present

## 2021-07-30 DIAGNOSIS — E291 Testicular hypofunction: Secondary | ICD-10-CM | POA: Diagnosis not present

## 2021-07-30 DIAGNOSIS — Z79899 Other long term (current) drug therapy: Secondary | ICD-10-CM | POA: Insufficient documentation

## 2021-07-30 DIAGNOSIS — E349 Endocrine disorder, unspecified: Secondary | ICD-10-CM

## 2021-07-30 DIAGNOSIS — Z5112 Encounter for antineoplastic immunotherapy: Secondary | ICD-10-CM | POA: Insufficient documentation

## 2021-07-30 DIAGNOSIS — C9000 Multiple myeloma not having achieved remission: Secondary | ICD-10-CM

## 2021-07-30 DIAGNOSIS — N184 Chronic kidney disease, stage 4 (severe): Secondary | ICD-10-CM | POA: Insufficient documentation

## 2021-07-30 DIAGNOSIS — C9002 Multiple myeloma in relapse: Secondary | ICD-10-CM

## 2021-07-30 DIAGNOSIS — G629 Polyneuropathy, unspecified: Secondary | ICD-10-CM | POA: Insufficient documentation

## 2021-07-30 LAB — CBC WITH DIFFERENTIAL (CANCER CENTER ONLY)
Abs Immature Granulocytes: 0.01 10*3/uL (ref 0.00–0.07)
Basophils Absolute: 0.1 10*3/uL (ref 0.0–0.1)
Basophils Relative: 2 %
Eosinophils Absolute: 0.2 10*3/uL (ref 0.0–0.5)
Eosinophils Relative: 4 %
HCT: 30.6 % — ABNORMAL LOW (ref 39.0–52.0)
Hemoglobin: 9.6 g/dL — ABNORMAL LOW (ref 13.0–17.0)
Immature Granulocytes: 0 %
Lymphocytes Relative: 20 %
Lymphs Abs: 0.8 10*3/uL (ref 0.7–4.0)
MCH: 24.9 pg — ABNORMAL LOW (ref 26.0–34.0)
MCHC: 31.4 g/dL (ref 30.0–36.0)
MCV: 79.5 fL — ABNORMAL LOW (ref 80.0–100.0)
Monocytes Absolute: 0.6 10*3/uL (ref 0.1–1.0)
Monocytes Relative: 14 %
Neutro Abs: 2.5 10*3/uL (ref 1.7–7.7)
Neutrophils Relative %: 60 %
Platelet Count: 175 10*3/uL (ref 150–400)
RBC: 3.85 MIL/uL — ABNORMAL LOW (ref 4.22–5.81)
RDW: 16.1 % — ABNORMAL HIGH (ref 11.5–15.5)
WBC Count: 4.1 10*3/uL (ref 4.0–10.5)
nRBC: 0 % (ref 0.0–0.2)

## 2021-07-30 LAB — CMP (CANCER CENTER ONLY)
ALT: 11 U/L (ref 0–44)
AST: 20 U/L (ref 15–41)
Albumin: 4.2 g/dL (ref 3.5–5.0)
Alkaline Phosphatase: 69 U/L (ref 38–126)
Anion gap: 9 (ref 5–15)
BUN: 25 mg/dL — ABNORMAL HIGH (ref 8–23)
CO2: 30 mmol/L (ref 22–32)
Calcium: 9 mg/dL (ref 8.9–10.3)
Chloride: 104 mmol/L (ref 98–111)
Creatinine: 2.45 mg/dL — ABNORMAL HIGH (ref 0.61–1.24)
GFR, Estimated: 27 mL/min — ABNORMAL LOW (ref >60)
Glucose, Bld: 128 mg/dL — ABNORMAL HIGH (ref 70–99)
Potassium: 3.6 mmol/L (ref 3.5–5.1)
Sodium: 143 mmol/L (ref 135–145)
Total Bilirubin: 0.4 mg/dL (ref 0.3–1.2)
Total Protein: 6.7 g/dL (ref 6.5–8.1)

## 2021-07-30 LAB — FERRITIN: Ferritin: 1152 ng/mL — ABNORMAL HIGH (ref 24–336)

## 2021-07-30 LAB — VITAMIN B12: Vitamin B-12: 654 pg/mL (ref 180–914)

## 2021-07-30 LAB — IRON AND IRON BINDING CAPACITY (CC-WL,HP ONLY)
Iron: 78 ug/dL (ref 45–182)
Saturation Ratios: 34 % (ref 17.9–39.5)
TIBC: 231 ug/dL — ABNORMAL LOW (ref 250–450)
UIBC: 153 ug/dL (ref 117–376)

## 2021-07-30 LAB — LACTATE DEHYDROGENASE: LDH: 223 U/L — ABNORMAL HIGH (ref 98–192)

## 2021-07-30 MED ORDER — PALONOSETRON HCL INJECTION 0.25 MG/5ML
0.2500 mg | Freq: Once | INTRAVENOUS | Status: AC
  Administered 2021-07-30: 0.25 mg via INTRAVENOUS
  Filled 2021-07-30: qty 5

## 2021-07-30 MED ORDER — SODIUM CHLORIDE 0.9% FLUSH
10.0000 mL | INTRAVENOUS | Status: DC | PRN
  Administered 2021-07-30: 10 mL

## 2021-07-30 MED ORDER — HEPARIN SOD (PORK) LOCK FLUSH 100 UNIT/ML IV SOLN
500.0000 [IU] | Freq: Once | INTRAVENOUS | Status: AC | PRN
  Administered 2021-07-30: 500 [IU]

## 2021-07-30 MED ORDER — HYDROMORPHONE HCL 1 MG/ML IJ SOLN
2.0000 mg | Freq: Once | INTRAMUSCULAR | Status: AC
  Administered 2021-07-30: 2 mg via INTRAVENOUS
  Filled 2021-07-30: qty 2

## 2021-07-30 MED ORDER — TESTOSTERONE CYPIONATE 200 MG/ML IM SOLN
400.0000 mg | Freq: Once | INTRAMUSCULAR | Status: AC
  Administered 2021-07-30: 400 mg via INTRAMUSCULAR
  Filled 2021-07-30: qty 2

## 2021-07-30 MED ORDER — SODIUM CHLORIDE 0.9 % IV SOLN
300.0000 mg/m2 | Freq: Once | INTRAVENOUS | Status: AC
  Administered 2021-07-30: 620 mg via INTRAVENOUS
  Filled 2021-07-30: qty 31

## 2021-07-30 MED ORDER — DEXTROSE 5 % IV SOLN
29.0000 mg/m2 | Freq: Once | INTRAVENOUS | Status: AC
  Administered 2021-07-30: 60 mg via INTRAVENOUS
  Filled 2021-07-30: qty 30

## 2021-07-30 MED ORDER — CYANOCOBALAMIN 1000 MCG/ML IJ SOLN
1000.0000 ug | Freq: Once | INTRAMUSCULAR | Status: AC
  Administered 2021-07-30: 1000 ug via INTRAMUSCULAR
  Filled 2021-07-30: qty 1

## 2021-07-30 MED ORDER — SODIUM CHLORIDE 0.9 % IV SOLN
10.0000 mg | Freq: Once | INTRAVENOUS | Status: AC
  Administered 2021-07-30: 10 mg via INTRAVENOUS
  Filled 2021-07-30: qty 10

## 2021-07-30 MED ORDER — SODIUM CHLORIDE 0.9 % IV SOLN: Freq: Once | INTRAVENOUS | Status: AC

## 2021-07-30 MED ORDER — EPOETIN ALFA-EPBX 40000 UNIT/ML IJ SOLN
40000.0000 [IU] | Freq: Once | INTRAMUSCULAR | Status: AC
  Administered 2021-07-30: 40000 [IU] via SUBCUTANEOUS
  Filled 2021-07-30: qty 1

## 2021-07-30 MED ORDER — SODIUM CHLORIDE 0.9 % IV SOLN: 300.0000 mg | Freq: Once | INTRAVENOUS | Status: DC

## 2021-07-30 MED ORDER — SODIUM CHLORIDE 0.9 % IV SOLN: Freq: Once | INTRAVENOUS | Status: DC

## 2021-07-30 MED ORDER — SODIUM CHLORIDE 0.9 % IV SOLN
60.0000 mg | Freq: Once | INTRAVENOUS | Status: AC
  Administered 2021-07-30: 60 mg via INTRAVENOUS
  Filled 2021-07-30: qty 10

## 2021-07-30 NOTE — Patient Instructions (Signed)
[facility]  Discharge Instructions: Thank you for choosing [facility] to provide your oncology and hematology care.   If you have a lab appointment with the [facility], please go directly to the [facility] and check in at the registration area.  Wear comfortable clothing and clothing appropriate for easy access to any Portacath or PICC line.   We strive to give you quality time with your provider. You may need to reschedule your appointment if you arrive late (15 or more minutes).  Arriving late affects you and other patients whose appointments are after yours.  Also, if you miss three or more appointments without notifying the office, you may be dismissed from the clinic at the provider's discretion.      For prescription refill requests, have your pharmacy contact our office and allow 72 hours for refills to be completed.    Today you received the following chemotherapy and/or immunotherapy agents Kyprolis, Cytoxan.      To help prevent nausea and vomiting after your treatment, we encourage you to take your nausea medication as directed.  BELOW ARE SYMPTOMS THAT SHOULD BE REPORTED IMMEDIATELY: *FEVER GREATER THAN 100.4 F (38 C) OR HIGHER *CHILLS OR SWEATING *NAUSEA AND VOMITING THAT IS NOT CONTROLLED WITH YOUR NAUSEA MEDICATION *UNUSUAL SHORTNESS OF BREATH *UNUSUAL BRUISING OR BLEEDING *URINARY PROBLEMS (pain or burning when urinating, or frequent urination) *BOWEL PROBLEMS (unusual diarrhea, constipation, pain near the anus) TENDERNESS IN MOUTH AND THROAT WITH OR WITHOUT PRESENCE OF ULCERS (sore throat, sores in mouth, or a toothache) UNUSUAL RASH, SWELLING OR PAIN  UNUSUAL VAGINAL DISCHARGE OR ITCHING   Items with * indicate a potential emergency and should be followed up as soon as possible or go to the Emergency Department if any problems should occur.  Please show the CHEMOTHERAPY ALERT CARD or IMMUNOTHERAPY ALERT CARD at check-in  to the Emergency Department and triage nurse. Should you have questions after your visit or need to cancel or reschedule your appointment, please contact [facility]  [phone] and follow the prompts.  Office hours are 8:00 a.m. to 4:30 p.m. Monday - Friday. Please note that voicemails left after 4:00 p.m. may not be returned until the following business day.  We are closed weekends and major holidays. You have access to a nurse at all times for urgent questions. Please call the main number to the clinic [phone] and follow the prompts.  For any non-urgent questions, you may also contact your provider using MyChart. We now offer e-Visits for anyone 6 and older to request care online for non-urgent symptoms. For details visit [email or web address].   Also download the MyChart app! Go to the app store, search "MyChart", open the app, select [facility], and log in with your MyChart username and password.  Due to Covid, a mask is required upon entering the hospital/clinic. If you do not have a mask, one will be given to you upon arrival. For doctor visits, patients may have 1 support person aged 30 or older with them. For treatment visits, patients cannot have anyone with them due to current Covid guidelines and our immunocompromised population.

## 2021-07-30 NOTE — Progress Notes (Signed)
Ok to infuse pamidronate over 3 hours today

## 2021-07-30 NOTE — Patient Instructions (Signed)

## 2021-07-30 NOTE — Progress Notes (Signed)
OK to run Aredia over 3 hours per Carolynne Edouard, Pharmacist

## 2021-07-30 NOTE — Progress Notes (Signed)
Hematology and Oncology Follow Up Visit  [name] [id number] 08-14-1950 71 y.o. 07/30/2021   Principle Diagnosis:  IgG kappa myeloma Anemia secondary to renal insufficiency Intermittent iron - deficiency anemia Hypotestosteronemia   Current Therapy:        Aredia 60 mg IV q  month -- Retacrit SQ as needed for hemoglobin less than 10 DepoTestosterone 400 mg q 4 weeks IV iron as indicated Kyprolis/Cytoxan - s/p cycle 32   Interim History:  [name] is here today for follow-up and treatment. He is doing fairly well but notes generalized aches and pains with arthritis worse with the recent rain.  M-spike last month was not observed. IgG level 25 mg/dL and kappa light chains 4.89 mg/dL.  Testosterone was 238.  No fever, chills, n/v, cough, rash, dizziness, SOB, chest pain, palpitations, abdominal pain or changes in bowel or bladder habits.  He takes a stool softener daily to prevent constipation.  No blood loss, bruising or petechiae.  No swelling in his extremities.  Neuropathy in the hands and feet comes and goes.  No falls or syncope to report.  Appetite comes and goes and he is hydrating well throughout the day. His weight is 181 lbs.  He states that his blood glucose levels have been well controlled.    ECOG Performance Status: 1 - Symptomatic but completely ambulatory  Medications:  Allergies as of 07/30/2021       Reactions   Iodinated Contrast Media Rash   Patient states he was instructed not to take IV contrast.  In 2008 he had an unknown reaction, and was told not to take it again.  He was also told not to take it due to his kidneys.   Iodine Anxiety, Rash, Other (See Comments)   Didn't feel right "instructed not to take per MD--something with his port"        Medication List        Accurate as of July 30, 2021  8:44 AM. If you have any questions, ask your nurse or doctor.          amLODipine 10 MG tablet Commonly known as: NORVASC Take 10 mg by  mouth daily.   ammonium lactate 12 % lotion Commonly known as: AmLactin Apply 1 application topically as needed for dry skin.   atorvastatin 40 MG tablet Commonly known as: LIPITOR Take 40 mg by mouth daily at 6 PM.   budesonide-formoterol 160-4.5 MCG/ACT inhaler Commonly known as: SYMBICORT SMARTSIG:2 Puff(s) By Mouth Twice Daily   Carafate 1 GM/10ML suspension Generic drug: sucralfate Take 1 g by mouth 4 (four) times daily.   carvedilol 25 MG tablet Commonly known as: COREG Take 25 mg by mouth 2 (two) times daily.   chlorpheniramine-HYDROcodone 10-8 MG/5ML SMARTSIG:5 Milliliter(s) By Mouth Every 12 Hours   [employer] Fiber Gummies 2 g Chew Chew by mouth daily.   diazepam 5 MG tablet Commonly known as: VALIUM Take by mouth at bedtime as needed.   dicyclomine 10 MG capsule Commonly known as: BENTYL TAKE 1 CAPSULE BY MOUTH 3 TIMES A DAY BEFORE MEALS   dronabinol 2.5 MG capsule Commonly known as: MARINOL Take 2.5 mg by mouth 2 (two) times daily before lunch and supper.   gabapentin 300 MG capsule Commonly known as: NEURONTIN TAKE 1 CAPSULE BY MOUTH THREE TIMES A DAY What changed:  how much to take how to take this when to take this additional instructions   glimepiride 2 MG tablet Commonly known as: AMARYL Take 2 mg  by mouth daily.   hydrocortisone 2.5 % cream Apply topically 2 (two) times daily.   lactulose 10 GM/15ML solution Commonly known as: CHRONULAC Take 30 mLs (20 g total) by mouth daily.   latanoprost 0.005 % ophthalmic solution Commonly known as: XALATAN Place 1 drop into both eyes at bedtime.   linaclotide 145 MCG Caps capsule Commonly known as: LINZESS Take by mouth daily as needed.   losartan 50 MG tablet Commonly known as: COZAAR Take 50 mg by mouth daily.   Lumigan 0.01 % Soln Generic drug: bimatoprost Place 1 drop into both eyes 3 (three) times daily.   mesalamine 1.2 g EC tablet Commonly known as: LIALDA Take by mouth daily with  breakfast.   metoprolol succinate 25 MG 24 hr tablet Commonly known as: TOPROL-XL Take 25 mg by mouth daily.   mirabegron ER 25 MG Tb24 tablet Commonly known as: MYRBETRIQ Take 20 mg by mouth daily.   morphine 30 MG 12 hr tablet Commonly known as: MS CONTIN Take 30 mg by mouth 2 (two) times daily.   omeprazole 40 MG capsule Commonly known as: PRILOSEC TAKE 1 CAPSULE BY MOUTH TWICE A DAY   ondansetron 8 MG tablet Commonly known as: ZOFRAN TAKE 1 TABLET(8 MG) BY MOUTH TWICE DAILY AS NEEDED FOR NAUSEA OR VOMITING   oxyCODONE 5 MG immediate release tablet Commonly known as: Roxicodone 1 tab PO q6 hours prn pain   oxyCODONE-acetaminophen 10-325 MG tablet Commonly known as: PERCOCET Take 1 tablet by mouth every 6 (six) hours as needed for pain.   pioglitazone 30 MG tablet Commonly known as: ACTOS Take 30 mg by mouth daily.   polyethylene glycol powder 17 GM/SCOOP powder Commonly known as: GLYCOLAX/MIRALAX SMARTSIG:1 scoopful By Mouth Twice Daily PRN   prochlorperazine 10 MG tablet Commonly known as: COMPAZINE take 1 tablet by mouth every 6 hours if needed for nausea and vomiting   Restasis 0.05 % ophthalmic emulsion Generic drug: cycloSPORINE Place 1 drop into both eyes daily.   SUMAtriptan 100 MG tablet Commonly known as: IMITREX Take 100 mg by mouth daily.   tadalafil 10 MG tablet Commonly known as: CIALIS Take by mouth.   topiramate 25 MG capsule Commonly known as: TOPAMAX Take 25 mg by mouth 2 (two) times daily.   torsemide 20 MG tablet Commonly known as: DEMADEX Take 20 mg by mouth daily.   Trulicity 5.17 OH/6.0VP Sopn Generic drug: Dulaglutide Inject 0.75 mg into the skin every Saturday.   Vitamin D (Ergocalciferol) 1.25 MG (50000 UNIT) Caps capsule Commonly known as: DRISDOL Take 50,000 Units by mouth every Saturday.        Allergies:  Allergies  Allergen Reactions   Iodinated Contrast Media Rash    Patient states he was instructed not to  take IV contrast.  In 2008 he had an unknown reaction, and was told not to take it again.  He was also told not to take it due to his kidneys.   Iodine Anxiety, Rash and Other (See Comments)    Didn't feel right "instructed not to take per MD--something with his port"     Past Medical History, Surgical history, Social history, and Family History were reviewed and updated.  Review of Systems: All other 10 point review of systems is negative.   Physical Exam:  weight is 181 lb 1.3 oz (82.1 kg). His oral temperature is 98.2 F (36.8 C). His blood pressure is 133/65 and his pulse is 68. His respiration is 18 and oxygen saturation  is 100%.   Wt Readings from Last 3 Encounters:  07/30/21 181 lb 1.3 oz (82.1 kg)  06/30/21 185 lb 0.8 oz (83.9 kg)  06/02/21 176 lb (79.8 kg)    Ocular: Sclerae unicteric, pupils equal, round and reactive to light Ear-nose-throat: Oropharynx clear, dentition fair Lymphatic: No cervical or supraclavicular adenopathy Lungs no rales or rhonchi, good excursion bilaterally Heart regular rate and rhythm, no murmur appreciated Abd soft, nontender, positive bowel sounds MSK no focal spinal tenderness, no joint edema Neuro: non-focal, well-oriented, appropriate affect Breasts: Deferred   Lab Results  Component Value Date   WBC 4.1 07/30/2021   HGB 9.6 (L) 07/30/2021   HCT 30.6 (L) 07/30/2021   MCV 79.5 (L) 07/30/2021   PLT 175 07/30/2021   Lab Results  Component Value Date   FERRITIN 1,291 (H) 06/30/2021   IRON 98 06/30/2021   TIBC 231 (L) 06/30/2021   UIBC 133 06/30/2021   IRONPCTSAT 42 (H) 06/30/2021   Lab Results  Component Value Date   RETICCTPCT 0.8 06/30/2021   RBC 3.85 (L) 07/30/2021   RETICCTABS 27.5 03/21/2014   Lab Results  Component Value Date   KPAFRELGTCHN 48.9 (H) 06/30/2021   LAMBDASER 23.5 06/30/2021   KAPLAMBRATIO 2.08 (H) 06/30/2021   Lab Results  Component Value Date   IGGSERUM 925 06/30/2021   IGA 221 06/30/2021    IGMSERUM 32 06/30/2021   Lab Results  Component Value Date   TOTALPROTELP 6.5 06/30/2021   ALBUMINELP 3.7 06/30/2021   A1GS 0.2 06/30/2021   A2GS 0.9 06/30/2021   BETS 0.8 06/30/2021   BETA2SER 0.5 11/07/2014   GAMS 0.9 06/30/2021   MSPIKE Not Observed 06/30/2021   SPEI Comment 04/07/2021     Chemistry      Component Value Date/Time   NA 141 06/30/2021 0820   NA 143 02/22/2017 0744   K 4.0 06/30/2021 0820   K 4.0 02/22/2017 0744   CL 103 06/30/2021 0820   CL 106 02/22/2017 0744   CO2 30 06/30/2021 0820   CO2 27 02/22/2017 0744   BUN 31 (H) 06/30/2021 0820   BUN 26 (H) 02/22/2017 0744   CREATININE 2.84 (H) 06/30/2021 0820   CREATININE 2.3 (H) 02/22/2017 0744      Component Value Date/Time   CALCIUM 8.6 (L) 06/30/2021 0820   CALCIUM 9.3 02/22/2017 0744   ALKPHOS 74 06/30/2021 0820   ALKPHOS 80 02/22/2017 0744   AST 18 06/30/2021 0820   ALT 11 06/30/2021 0820   ALT 22 02/22/2017 0744   BILITOT 0.3 06/30/2021 0820       Impression and Plan: Mr. Marsalis is a very pleasant 71 yo African American gentleman with IgG kappa myeloma.  We will proceed with treatment today as planned.  Iron and protein studies are pending.  Follow-up in 1 month.   Lottie Dawson, NP 6/7/20238:44 AM

## 2021-07-31 LAB — KAPPA/LAMBDA LIGHT CHAINS
Kappa free light chain: 48.7 mg/L — ABNORMAL HIGH (ref 3.3–19.4)
Kappa, lambda light chain ratio: 1.96 — ABNORMAL HIGH (ref 0.26–1.65)
Lambda free light chains: 24.9 mg/L (ref 5.7–26.3)

## 2021-07-31 LAB — IGG, IGA, IGM
IgA: 232 mg/dL (ref 61–437)
IgG (Immunoglobin G), Serum: 989 mg/dL (ref 603–1613)
IgM (Immunoglobulin M), Srm: 42 mg/dL (ref 15–143)

## 2021-07-31 LAB — TESTOSTERONE: Testosterone: 190 ng/dL — ABNORMAL LOW (ref 264–916)

## 2021-08-01 ENCOUNTER — Encounter: Payer: Self-pay | Admitting: Hematology & Oncology

## 2021-08-01 ENCOUNTER — Encounter: Payer: Self-pay | Admitting: Family

## 2021-08-05 LAB — PROTEIN ELECTROPHORESIS, SERUM, WITH REFLEX
A/G Ratio: 1.4 (ref 0.7–1.7)
Albumin ELP: 3.9 g/dL (ref 2.9–4.4)
Alpha-1-Globulin: 0.2 g/dL (ref 0.0–0.4)
Alpha-2-Globulin: 0.8 g/dL (ref 0.4–1.0)
Beta Globulin: 0.9 g/dL (ref 0.7–1.3)
Gamma Globulin: 0.8 g/dL (ref 0.4–1.8)
Globulin, Total: 2.7 g/dL (ref 2.2–3.9)
SPEP Interpretation: 0
Total Protein ELP: 6.6 g/dL (ref 6.0–8.5)

## 2021-08-05 LAB — IMMUNOFIXATION REFLEX, SERUM
IgA: 245 mg/dL (ref 61–437)
IgG (Immunoglobin G), Serum: 1006 mg/dL (ref 603–1613)
IgM (Immunoglobulin M), Srm: 36 mg/dL (ref 15–143)

## 2021-08-20 ENCOUNTER — Ambulatory Visit: Payer: 59 | Admitting: Family

## 2021-08-20 ENCOUNTER — Ambulatory Visit: Payer: 59

## 2021-08-20 ENCOUNTER — Other Ambulatory Visit: Payer: 59

## 2021-08-27 ENCOUNTER — Inpatient Hospital Stay: Payer: 59

## 2021-08-27 ENCOUNTER — Inpatient Hospital Stay: Payer: 59 | Attending: Hematology & Oncology

## 2021-08-27 ENCOUNTER — Encounter: Payer: Self-pay | Admitting: Family

## 2021-08-27 ENCOUNTER — Telehealth: Payer: Self-pay | Admitting: *Deleted

## 2021-08-27 ENCOUNTER — Inpatient Hospital Stay (HOSPITAL_BASED_OUTPATIENT_CLINIC_OR_DEPARTMENT_OTHER): Payer: 59 | Admitting: Family

## 2021-08-27 VITALS — BP 124/67 | HR 67 | Temp 98.2°F | Resp 18 | Wt 174.1 lb

## 2021-08-27 DIAGNOSIS — D509 Iron deficiency anemia, unspecified: Secondary | ICD-10-CM | POA: Insufficient documentation

## 2021-08-27 DIAGNOSIS — R634 Abnormal weight loss: Secondary | ICD-10-CM

## 2021-08-27 DIAGNOSIS — N189 Chronic kidney disease, unspecified: Secondary | ICD-10-CM

## 2021-08-27 DIAGNOSIS — Z5112 Encounter for antineoplastic immunotherapy: Secondary | ICD-10-CM | POA: Insufficient documentation

## 2021-08-27 DIAGNOSIS — D500 Iron deficiency anemia secondary to blood loss (chronic): Secondary | ICD-10-CM

## 2021-08-27 DIAGNOSIS — C9000 Multiple myeloma not having achieved remission: Secondary | ICD-10-CM

## 2021-08-27 DIAGNOSIS — Z5111 Encounter for antineoplastic chemotherapy: Secondary | ICD-10-CM | POA: Diagnosis present

## 2021-08-27 DIAGNOSIS — D631 Anemia in chronic kidney disease: Secondary | ICD-10-CM

## 2021-08-27 DIAGNOSIS — E349 Endocrine disorder, unspecified: Secondary | ICD-10-CM

## 2021-08-27 DIAGNOSIS — R630 Anorexia: Secondary | ICD-10-CM

## 2021-08-27 DIAGNOSIS — Z299 Encounter for prophylactic measures, unspecified: Secondary | ICD-10-CM

## 2021-08-27 DIAGNOSIS — E291 Testicular hypofunction: Secondary | ICD-10-CM | POA: Insufficient documentation

## 2021-08-27 LAB — CBC WITH DIFFERENTIAL (CANCER CENTER ONLY)
Abs Immature Granulocytes: 0.03 10*3/uL (ref 0.00–0.07)
Basophils Absolute: 0.1 10*3/uL (ref 0.0–0.1)
Basophils Relative: 1 %
Eosinophils Absolute: 0.1 10*3/uL (ref 0.0–0.5)
Eosinophils Relative: 4 %
HCT: 30.6 % — ABNORMAL LOW (ref 39.0–52.0)
Hemoglobin: 9.8 g/dL — ABNORMAL LOW (ref 13.0–17.0)
Immature Granulocytes: 1 %
Lymphocytes Relative: 26 %
Lymphs Abs: 0.9 10*3/uL (ref 0.7–4.0)
MCH: 24.9 pg — ABNORMAL LOW (ref 26.0–34.0)
MCHC: 32 g/dL (ref 30.0–36.0)
MCV: 77.9 fL — ABNORMAL LOW (ref 80.0–100.0)
Monocytes Absolute: 0.7 10*3/uL (ref 0.1–1.0)
Monocytes Relative: 19 %
Neutro Abs: 1.8 10*3/uL (ref 1.7–7.7)
Neutrophils Relative %: 49 %
Platelet Count: 166 10*3/uL (ref 150–400)
RBC: 3.93 MIL/uL — ABNORMAL LOW (ref 4.22–5.81)
RDW: 16.1 % — ABNORMAL HIGH (ref 11.5–15.5)
WBC Count: 3.7 10*3/uL — ABNORMAL LOW (ref 4.0–10.5)
nRBC: 0 % (ref 0.0–0.2)

## 2021-08-27 LAB — FERRITIN: Ferritin: 945 ng/mL — ABNORMAL HIGH (ref 24–336)

## 2021-08-27 LAB — CMP (CANCER CENTER ONLY)
ALT: 14 U/L (ref 0–44)
AST: 22 U/L (ref 15–41)
Albumin: 4.3 g/dL (ref 3.5–5.0)
Alkaline Phosphatase: 68 U/L (ref 38–126)
Anion gap: 10 (ref 5–15)
BUN: 26 mg/dL — ABNORMAL HIGH (ref 8–23)
CO2: 29 mmol/L (ref 22–32)
Calcium: 8.7 mg/dL — ABNORMAL LOW (ref 8.9–10.3)
Chloride: 102 mmol/L (ref 98–111)
Creatinine: 2.76 mg/dL — ABNORMAL HIGH (ref 0.61–1.24)
GFR, Estimated: 24 mL/min — ABNORMAL LOW (ref >60)
Glucose, Bld: 96 mg/dL (ref 70–99)
Potassium: 3.3 mmol/L — ABNORMAL LOW (ref 3.5–5.1)
Sodium: 141 mmol/L (ref 135–145)
Total Bilirubin: 0.4 mg/dL (ref 0.3–1.2)
Total Protein: 6.7 g/dL (ref 6.5–8.1)

## 2021-08-27 LAB — RETICULOCYTES
Immature Retic Fract: 10.1 % (ref 2.3–15.9)
RBC.: 3.95 MIL/uL — ABNORMAL LOW (ref 4.22–5.81)
Retic Count, Absolute: 29.2 10*3/uL (ref 19.0–186.0)
Retic Ct Pct: 0.7 % (ref 0.4–3.1)

## 2021-08-27 LAB — IRON AND IRON BINDING CAPACITY (CC-WL,HP ONLY)
Iron: 70 ug/dL (ref 45–182)
Saturation Ratios: 33 % (ref 17.9–39.5)
TIBC: 213 ug/dL — ABNORMAL LOW (ref 250–450)
UIBC: 143 ug/dL (ref 117–376)

## 2021-08-27 LAB — LACTATE DEHYDROGENASE: LDH: 216 U/L — ABNORMAL HIGH (ref 98–192)

## 2021-08-27 MED ORDER — SODIUM CHLORIDE 0.9 % IV SOLN
10.0000 mg | Freq: Once | INTRAVENOUS | Status: AC
  Administered 2021-08-27: 10 mg via INTRAVENOUS
  Filled 2021-08-27: qty 10

## 2021-08-27 MED ORDER — SODIUM CHLORIDE 0.9 % IV SOLN
300.0000 mg/m2 | Freq: Once | INTRAVENOUS | Status: AC
  Administered 2021-08-27: 620 mg via INTRAVENOUS
  Filled 2021-08-27: qty 31

## 2021-08-27 MED ORDER — SODIUM CHLORIDE 0.9 % IV SOLN: Freq: Once | INTRAVENOUS | Status: DC

## 2021-08-27 MED ORDER — HYDROMORPHONE HCL 1 MG/ML IJ SOLN
2.0000 mg | Freq: Once | INTRAMUSCULAR | Status: AC
  Administered 2021-08-27: 2 mg via INTRAVENOUS
  Filled 2021-08-27: qty 2

## 2021-08-27 MED ORDER — HEPARIN SOD (PORK) LOCK FLUSH 100 UNIT/ML IV SOLN
500.0000 [IU] | Freq: Once | INTRAVENOUS | Status: AC | PRN
  Administered 2021-08-27: 500 [IU]

## 2021-08-27 MED ORDER — SODIUM CHLORIDE 0.9% FLUSH
10.0000 mL | INTRAVENOUS | Status: DC | PRN
  Administered 2021-08-27: 10 mL

## 2021-08-27 MED ORDER — SODIUM CHLORIDE 0.9 % IV SOLN: Freq: Once | INTRAVENOUS | Status: AC

## 2021-08-27 MED ORDER — PALONOSETRON HCL INJECTION 0.25 MG/5ML
0.2500 mg | Freq: Once | INTRAVENOUS | Status: AC
  Administered 2021-08-27: 0.25 mg via INTRAVENOUS
  Filled 2021-08-27: qty 5

## 2021-08-27 MED ORDER — CYANOCOBALAMIN 1000 MCG/ML IJ SOLN
1000.0000 ug | Freq: Once | INTRAMUSCULAR | Status: AC
  Administered 2021-08-27: 1000 ug via INTRAMUSCULAR
  Filled 2021-08-27: qty 1

## 2021-08-27 MED ORDER — DRONABINOL 5 MG PO CAPS: 5.0000 mg | ORAL_CAPSULE | Freq: Two times a day (BID) | ORAL | 1 refills | Status: DC

## 2021-08-27 MED ORDER — TESTOSTERONE CYPIONATE 200 MG/ML IM SOLN
400.0000 mg | Freq: Once | INTRAMUSCULAR | Status: AC
  Administered 2021-08-27: 400 mg via INTRAMUSCULAR
  Filled 2021-08-27: qty 2

## 2021-08-27 MED ORDER — SODIUM CHLORIDE 0.9 % IV SOLN
300.0000 mg | Freq: Once | INTRAVENOUS | Status: AC
  Administered 2021-08-27: 300 mg via INTRAVENOUS
  Filled 2021-08-27: qty 300

## 2021-08-27 MED ORDER — DEXTROSE 5 % IV SOLN
29.0000 mg/m2 | Freq: Once | INTRAVENOUS | Status: AC
  Administered 2021-08-27: 60 mg via INTRAVENOUS
  Filled 2021-08-27: qty 30

## 2021-08-27 NOTE — Patient Instructions (Signed)

## 2021-08-27 NOTE — Telephone Encounter (Signed)
Per 08/27/21 los - gave upcoming appointments - confirmed - print calendar

## 2021-08-27 NOTE — Progress Notes (Signed)
Ok to treat despite counts and hold Aredia today, per Judson Roch, NP.

## 2021-08-27 NOTE — Patient Instructions (Signed)
Testosterone Injection What is this medication? TESTOSTERONE (tes TOS ter one) is used to increase testosterone levels in your body. It belongs to a group of medications called androgen hormones. This medicine may be used for other purposes; ask your health care provider or pharmacist if you have questions. COMMON BRAND NAME(S): Andro-L.A., Aveed, Delatestryl, Depo-Testosterone, Virilon What should I tell my care team before I take this medication? They need to know if you have any of these conditions: Cancer Diabetes Heart disease Kidney disease Liver disease Lung disease Prostate disease An unusual or allergic reaction to testosterone, other medications, foods, dyes, or preservatives If a male partner is pregnant or trying to get pregnant Breast-feeding How should I use this medication? This medication is for injection into a muscle. It is usually given in a hospital or clinic. A special MedGuide will be given to you by the pharmacist with each prescription and refill. Be sure to read this information carefully each time. Contact your care team regarding the use of this medication in children. While this medication may be prescribed for children as young as 9 years of age for selected conditions, precautions do apply. Overdosage: If you think you have taken too much of this medicine contact a poison control center or emergency room at once. NOTE: This medicine is only for you. Do not share this medicine with others. What if I miss a dose? Try not to miss a dose. Your care team will tell you when your next injection is due. Notify the office if you are unable to keep an appointment. What may interact with this medication? Medications for diabetes Medications that treat or prevent blood clots like warfarin Oxyphenbutazone Propranolol Steroid medications like prednisone or cortisone This list may not describe all possible interactions. Give your health care provider a list of all the  medicines, herbs, non-prescription drugs, or dietary supplements you use. Also tell them if you smoke, drink alcohol, or use illegal drugs. Some items may interact with your medicine. What should I watch for while using this medication? Visit your care team for regular checks on your progress. They will need to check the level of testosterone in your blood. This medication is only approved for use in men who have low levels of testosterone related to certain medical conditions. Heart attacks and strokes have been reported with the use of this medication. Notify your care team and seek emergency treatment if you develop breathing problems; changes in vision; confusion; chest pain or chest tightness; sudden arm pain; severe, sudden headache; trouble speaking or understanding; sudden numbness or weakness of the face, arm or leg; loss of balance or coordination. Talk to your care team about the risks and benefits of this medication. This medication may affect blood sugar levels. If you have diabetes, check with your care team before you change your diet or the dose of your diabetic medication. Testosterone injections are not commonly used in women. Women should inform their care team if they wish to become pregnant or think they might be pregnant. There is a potential for serious side effects to an unborn child. Talk to your care team or pharmacist for more information. Talk with your care team about your birth control options while taking this medication. This medication is banned from use in athletes by most athletic organizations. What side effects may I notice from receiving this medication? Side effects that you should report to your care team as soon as possible: Allergic reactions--skin rash, itching, hives, swelling of the  face, lips, tongue, or throat Blood clot--pain, swelling, or warmth in the leg, shortness of breath, chest pain Heart attack--pain or tightness in the chest, shoulders, arms or jaw,  nausea, shortness of breath, cold or clammy skin, feeling faint or lightheaded Increase in blood pressure Liver injury--right upper belly pain, loss of appetite, nausea, light-colored stool, dark yellow or brown urine, yellowing of the skin or eyes, unusual weakness or fatigue Mood swings, irritability, or hostility Prolonged or painful erection Sleep apnea--loud snoring, gasping during sleep, daytime sleepiness Stroke--sudden numbness or weakness of the face, arm or leg, trouble speaking, confusion, trouble walking, loss of balance or coordination, dizziness, severe headache, change in vision Swelling of the ankles, hands, or feet Thoughts of suicide or self-harm, worsening mood, feelings of depression Side effects that usually do not require medical attention (report to your care team if they continue or are bothersome): Acne Change in sex drive or performance Pain, redness, or irritation at the application site Unexpected breast tissue growth This list may not describe all possible side effects. Call your doctor for medical advice about side effects. You may report side effects to FDA at 1-800-FDA-1088. Where should I keep my medication? Keep out of the reach of children. This medication can be abused. Keep your medication in a safe place to protect it from theft. Do not share this medication with anyone. Selling or giving away this medication is dangerous and against the law. Store at room temperature between 20 and 25 degrees C (68 and 77 degrees F). Do not freeze. Protect from light. Follow the directions for the product you are prescribed. Throw away any unused medication after the expiration date. NOTE: This sheet is a summary. It may not cover all possible information. If you have questions about this medicine, talk to your doctor, pharmacist, or health care provider.  2023 Elsevier/Gold Standard (2020-02-07 00:00:00)  Cyclophosphamide Injection What is this  medication? CYCLOPHOSPHAMIDE (sye kloe FOSS fa mide) is a chemotherapy drug. It slows the growth of cancer cells. This medicine is used to treat many types of cancer like lymphoma, myeloma, leukemia, breast cancer, and ovarian cancer, to name a few. This medicine may be used for other purposes; ask your health care provider or pharmacist if you have questions. COMMON BRAND NAME(S): Cyclophosphamide, Cytoxan, Neosar What should I tell my care team before I take this medication? They need to know if you have any of these conditions: heart disease history of irregular heartbeat infection kidney disease liver disease low blood counts, like white cells, platelets, or red blood cells on hemodialysis recent or ongoing radiation therapy scarring or thickening of the lungs trouble passing urine an unusual or allergic reaction to cyclophosphamide, other medicines, foods, dyes, or preservatives pregnant or trying to get pregnant breast-feeding How should I use this medication? This drug is usually given as an injection into a vein or muscle or by infusion into a vein. It is administered in a hospital or clinic by a specially trained health care professional. Talk to your pediatrician regarding the use of this medicine in children. Special care may be needed. Overdosage: If you think you have taken too much of this medicine contact a poison control center or emergency room at once. NOTE: This medicine is only for you. Do not share this medicine with others. What if I miss a dose? It is important not to miss your dose. Call your doctor or health care professional if you are unable to keep an appointment. What may interact  with this medication? amphotericin B azathioprine certain antivirals for HIV or hepatitis certain medicines for blood pressure, heart disease, irregular heart beat certain medicines that treat or prevent blood clots like warfarin certain other medicines for  cancer cyclosporine etanercept indomethacin medicines that relax muscles for surgery medicines to increase blood counts metronidazole This list may not describe all possible interactions. Give your health care provider a list of all the medicines, herbs, non-prescription drugs, or dietary supplements you use. Also tell them if you smoke, drink alcohol, or use illegal drugs. Some items may interact with your medicine. What should I watch for while using this medication? Your condition will be monitored carefully while you are receiving this medicine. You may need blood work done while you are taking this medicine. Drink water or other fluids as directed. Urinate often, even at night. Some products may contain alcohol. Ask your health care professional if this medicine contains alcohol. Be sure to tell all health care professionals you are taking this medicine. Certain medicines, like metronidazole and disulfiram, can cause an unpleasant reaction when taken with alcohol. The reaction includes flushing, headache, nausea, vomiting, sweating, and increased thirst. The reaction can last from 30 minutes to several hours. Do not become pregnant while taking this medicine or for 1 year after stopping it. Women should inform their health care professional if they wish to become pregnant or think they might be pregnant. Men should not father a child while taking this medicine and for 4 months after stopping it. There is potential for serious side effects to an unborn child. Talk to your health care professional for more information. Do not breast-feed an infant while taking this medicine or for 1 week after stopping it. This medicine has caused ovarian failure in some women. This medicine may make it more difficult to get pregnant. Talk to your health care professional if you are concerned about your fertility. This medicine has caused decreased sperm counts in some men. This may make it more difficult to  father a child. Talk to your health care professional if you are concerned about your fertility. Call your health care professional for advice if you get a fever, chills, or sore throat, or other symptoms of a cold or flu. Do not treat yourself. This medicine decreases your body's ability to fight infections. Try to avoid being around people who are sick. Avoid taking medicines that contain aspirin, acetaminophen, ibuprofen, naproxen, or ketoprofen unless instructed by your health care professional. These medicines may hide a fever. Talk to your health care professional about your risk of cancer. You may be more at risk for certain types of cancer if you take this medicine. If you are going to need surgery or other procedure, tell your health care professional that you are using this medicine. Be careful brushing or flossing your teeth or using a toothpick because you may get an infection or bleed more easily. If you have any dental work done, tell your dentist you are receiving this medicine. What side effects may I notice from receiving this medication? Side effects that you should report to your doctor or health care professional as soon as possible: allergic reactions like skin rash, itching or hives, swelling of the face, lips, or tongue breathing problems nausea, vomiting signs and symptoms of bleeding such as bloody or black, tarry stools; red or dark brown urine; spitting up blood or brown material that looks like coffee grounds; red spots on the skin; unusual bruising or bleeding from the  eyes, gums, or nose signs and symptoms of heart failure like fast, irregular heartbeat, sudden weight gain; swelling of the ankles, feet, hands signs and symptoms of infection like fever; chills; cough; sore throat; pain or trouble passing urine signs and symptoms of kidney injury like trouble passing urine or change in the amount of urine signs and symptoms of liver injury like dark yellow or brown urine;  general ill feeling or flu-like symptoms; light-colored stools; loss of appetite; nausea; right upper belly pain; unusually weak or tired; yellowing of the eyes or skin Side effects that usually do not require medical attention (report to your doctor or health care professional if they continue or are bothersome): confusion decreased hearing diarrhea facial flushing hair loss headache loss of appetite missed menstrual periods signs and symptoms of low red blood cells or anemia such as unusually weak or tired; feeling faint or lightheaded; falls skin discoloration This list may not describe all possible side effects. Call your doctor for medical advice about side effects. You may report side effects to FDA at 1-800-FDA-1088. Where should I keep my medication? This drug is given in a hospital or clinic and will not be stored at home. NOTE: This sheet is a summary. It may not cover all possible information. If you have questions about this medicine, talk to your doctor, pharmacist, or health care provider.  2023 Elsevier/Gold Standard (2021-01-10 00:00:00)  Carfilzomib injection What is this medication? CARFILZOMIB (kar FILZ oh mib) targets a specific protein within cancer cells and stops the cancer cells from growing. It is used to treat multiple myeloma. This medicine may be used for other purposes; ask your health care provider or pharmacist if you have questions. COMMON BRAND NAME(S): KYPROLIS What should I tell my care team before I take this medication? They need to know if you have any of these conditions: heart disease history of blood clots irregular heartbeat kidney disease liver disease lung or breathing disease an unusual or allergic reaction to carfilzomib, or other medicines, foods, dyes, or preservatives pregnant or trying to get pregnant breast-feeding How should I use this medication? This medicine is for injection or infusion into a vein. It is given by a health  care professional in a hospital or clinic setting. Talk to your pediatrician regarding the use of this medicine in children. Special care may be needed. Overdosage: If you think you have taken too much of this medicine contact a poison control center or emergency room at once. NOTE: This medicine is only for you. Do not share this medicine with others. What if I miss a dose? It is important not to miss your dose. Call your doctor or health care professional if you are unable to keep an appointment. What may interact with this medication? Interactions are not expected. This list may not describe all possible interactions. Give your health care provider a list of all the medicines, herbs, non-prescription drugs, or dietary supplements you use. Also tell them if you smoke, drink alcohol, or use illegal drugs. Some items may interact with your medicine. What should I watch for while using this medication? Your condition will be monitored while you are receiving this medicine. You may need blood work done while you are taking this medicine. Do not become pregnant while taking this medicine or for 6 months after stopping it. Women should inform their health care provider if they wish to become pregnant or think they might be pregnant. Men should not father a child while taking this  medicine and for 3 months after stopping it. There is a potential for serious side effects to an unborn child. Talk to your health care provider for more information. Do not breast-feed an infant while taking this medicine or for 2 weeks after stopping it. Check with your health care provider if you have severe diarrhea, nausea, and vomiting, or if you sweat a lot. The loss of too much body fluid may make it dangerous for you to take this medicine. You may get drowsy or dizzy. Do not drive, use machinery, or do anything that needs mental alertness until you know how this medicine affects you. Do not stand up or sit up quickly,  especially if you are an older patient. This reduces the risk of dizzy or fainting spells. What side effects may I notice from receiving this medication? Side effects that you should report to your doctor or health care professional as soon as possible: allergic reactions like skin rash, itching or hives, swelling of the face, lips, or tongue confusion dizziness feeling faint or lightheaded fever or chills palpitations seizures signs and symptoms of bleeding such as bloody or black, tarry stools; red or dark-brown urine; spitting up blood or brown material that looks like coffee grounds; red spots on the skin; unusual bruising or bleeding including from the eye, gums, or nose signs and symptoms of a blood clot such as breathing problems; changes in vision; chest pain; severe, sudden headache; pain, swelling, warmth in the leg; trouble speaking; sudden numbness or weakness of the face, arm or leg signs and symptoms of kidney injury like trouble passing urine or change in the amount of urine signs and symptoms of liver injury like dark yellow or brown urine; general ill feeling or flu-like symptoms; light-colored stools; loss of appetite; nausea; right upper belly pain; unusually weak or tired; yellowing of the eyes or skin Side effects that usually do not require medical attention (report to your doctor or health care professional if they continue or are bothersome): back pain cough diarrhea headache muscle cramps trouble sleeping vomiting This list may not describe all possible side effects. Call your doctor for medical advice about side effects. You may report side effects to FDA at 1-800-FDA-1088. Where should I keep my medication? This drug is given in a hospital or clinic and will not be stored at home. NOTE: This sheet is a summary. It may not cover all possible information. If you have questions about this medicine, talk to your doctor, pharmacist, or health care provider.  2023  Elsevier/Gold Standard (2020-03-21 00:00:00)  Vitamin B12 Deficiency Vitamin B12 deficiency means that your body does not have enough vitamin B12. The body needs this important vitamin: To make red blood cells. To make genes (DNA). To help the nerves work. If you do not have enough vitamin B12 in your body, you can have health problems, such as not having enough red blood cells in the blood (anemia). What are the causes? Not eating enough foods that contain vitamin B12. Not being able to take in (absorb) vitamin B12 from the food that you eat. Certain diseases. A condition in which the body does not make enough of a certain protein. This results in your body not taking in enough vitamin B12. Having a surgery in which part of the stomach or small intestine is taken out. Taking medicines that make it hard for the body to take in vitamin B12. These include: Heartburn medicines. Some medicines that are used to treat diabetes. What  increases the risk? Being an older adult. Eating a vegetarian or vegan diet that does not include any foods that come from animals. Not eating enough foods that contain vitamin B12 while you are pregnant. Taking certain medicines. Having alcoholism. What are the signs or symptoms? In some cases, there are no symptoms. If the condition leads to too few blood cells or nerve damage, symptoms can occur, such as: Feeling weak or tired. Not being hungry. Losing feeling (numbness) or tingling in your hands and feet. Redness and burning of the tongue. Feeling sad (depressed). Confusion or memory problems. Trouble walking. If anemia is very bad, symptoms can include: Being short of breath. Being dizzy. Having a very fast heartbeat. How is this treated? Changing the way you eat and drink, such as: Eating more foods that contain vitamin B12. Drinking little or no alcohol. Getting vitamin B12 shots. Taking vitamin B12 supplements by mouth (orally). Your doctor will  tell you the dose that is best for you. Follow these instructions at home: Eating and drinking  Eat foods that come from animals and have a lot of vitamin B12 in them. These include: Meats and poultry. This includes beef, pork, chicken, [place], and organ meats, such as liver. Seafood, such as clams, rainbow trout, salmon, tuna, and haddock. Eggs. Dairy foods such as milk, yogurt, and cheese. Eat breakfast cereals that have vitamin B12 added to them (are fortified). Check the label. The items listed above may not be a complete list of foods and beverages you can eat and drink. Contact a dietitian for more information. Alcohol use Do not drink alcohol if: Your doctor tells you not to drink. You are pregnant, may be pregnant, or are planning to become pregnant. If you drink alcohol: Limit how much you have to: 0-1 drink a day for women. 0-2 drinks a day for men. Know how much alcohol is in your drink. In the [place] one drink equals one 12 oz bottle of beer (355 mL), one 5 oz glass of wine (148 mL), or one 1 oz glass of hard liquor (44 mL). General instructions Get any vitamin B12 shots if told by your doctor. Take supplements only as told by your doctor. Follow the directions. Keep all follow-up visits. Contact a doctor if: Your symptoms come back. Your symptoms get worse or do not get better with treatment. Get help right away if: You have trouble breathing. You have a very fast heartbeat. You have chest pain. You get dizzy. You faint. These symptoms may be an emergency. Get help right away. Call 911. Do not wait to see if the symptoms will go away. Do not drive yourself to the hospital. Summary Vitamin B12 deficiency means that your body is not getting enough of the vitamin. In some cases, there are no symptoms of this condition. Treatment may include making a change in the way you eat and drink, getting shots, or taking supplements. Eat foods that have vitamin B12 in  them. This information is not intended to replace advice given to you by your health care provider. Make sure you discuss any questions you have with your health care provider. Document Revised: 10/04/2020 Document Reviewed: 10/04/2020 Elsevier Patient Education  [place].  Iron Sucrose Injection What is this medication? IRON SUCROSE (EYE ern SOO krose) treats low levels of iron (iron deficiency anemia) in people with kidney disease. Iron is a mineral that plays an important role in making red blood cells, which carry oxygen from your lungs to  the rest of your body. This medicine may be used for other purposes; ask your health care provider or pharmacist if you have questions. COMMON BRAND NAME(S): Venofer What should I tell my care team before I take this medication? They need to know if you have any of these conditions: Anemia not caused by low iron levels Heart disease High levels of iron in the blood Kidney disease Liver disease An unusual or allergic reaction to iron, other medications, foods, dyes, or preservatives Pregnant or trying to get pregnant Breast-feeding How should I use this medication? This medication is for infusion into a vein. It is given in a hospital or clinic setting. Talk to your care team about the use of this medication in children. While this medication may be prescribed for children as young as 2 years for selected conditions, precautions do apply. Overdosage: If you think you have taken too much of this medicine contact a poison control center or emergency room at once. NOTE: This medicine is only for you. Do not share this medicine with others. What if I miss a dose? It is important not to miss your dose. Call your care team if you are unable to keep an appointment. What may interact with this medication? Do not take this medication with any of the following: Deferoxamine Dimercaprol Other iron products This medication may also interact with  the following: Chloramphenicol Deferasirox This list may not describe all possible interactions. Give your health care provider a list of all the medicines, herbs, non-prescription drugs, or dietary supplements you use. Also tell them if you smoke, drink alcohol, or use illegal drugs. Some items may interact with your medicine. What should I watch for while using this medication? Visit your care team regularly. Tell your care team if your symptoms do not start to get better or if they get worse. You may need blood work done while you are taking this medication. You may need to follow a special diet. Talk to your care team. Foods that contain iron include: whole grains/cereals, dried fruits, beans, or peas, leafy green vegetables, and organ meats (liver, kidney). What side effects may I notice from receiving this medication? Side effects that you should report to your care team as soon as possible: Allergic reactions--skin rash, itching, hives, swelling of the face, lips, tongue, or throat Low blood pressure--dizziness, feeling faint or lightheaded, blurry vision Shortness of breath Side effects that usually do not require medical attention (report to your care team if they continue or are bothersome): Flushing Headache Joint pain Muscle pain Nausea Pain, redness, or irritation at injection site This list may not describe all possible side effects. Call your doctor for medical advice about side effects. You may report side effects to FDA at 1-800-FDA-1088. Where should I keep my medication? This medication is given in a hospital or clinic and will not be stored at home. NOTE: This sheet is a summary. It may not cover all possible information. If you have questions about this medicine, talk to your doctor, pharmacist, or health care provider.  2023 Elsevier/Gold Standard (2020-07-05 00:00:00)

## 2021-08-27 NOTE — Progress Notes (Signed)
Hematology and Oncology Follow Up Visit  [name] [id number] 12/12/1950 71 y.o. 08/27/2021   Principle Diagnosis:  IgG kappa myeloma Anemia secondary to renal insufficiency Intermittent iron - deficiency anemia Hypotestosteronemia   Current Therapy:        Aredia 60 mg IV q  month -- Retacrit SQ as needed for hemoglobin less than 10 DepoTestosterone 400 mg q 4 weeks IV iron as indicated Kyprolis/Cytoxan - s/p cycle 33   Interim History:  [name] is here today for follow-up and treatment. He is doing fairly well but has generalized aches and pain all over due to arthritis. He states that he is still getting out and doing the things that he enjoys and doesn't sit around the house much.  He notes chills despite the warm weather. Iron studies are pending.  No fever, cough, rash, dizziness, SOB, chest pain, palpitations, abdominal pain or changes in bowel or bladder habits.  No bleeding, bruising or petechiae.  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  Minimal swelling in the ankles that resolves over night.  Pedal pulses are 1+.  No falls or syncope reported.  Appetite comes and goes. He does feel that he is staying well hydrated. His weight is down 7 lbs to 174 lbs.  No M-spike detected at his last visit. IgG level was 989 mg/dL and kappa light chains 48.7 mg/L.   ECOG Performance Status: 1 - Symptomatic but completely ambulatory  Medications:  Allergies as of 08/27/2021       Reactions   Iodinated Contrast Media Rash   Patient states he was instructed not to take IV contrast.  In 2008 he had an unknown reaction, and was told not to take it again.  He was also told not to take it due to his kidneys.   Iodine Anxiety, Rash, Other (See Comments)   Didn't feel right "instructed not to take per MD--something with his port"        Medication List        Accurate as of August 27, 2021  8:44 AM. If you have any questions, ask your nurse or doctor.           amLODipine 10 MG tablet Commonly known as: NORVASC Take 10 mg by mouth daily.   ammonium lactate 12 % lotion Commonly known as: AmLactin Apply 1 application topically as needed for dry skin.   atorvastatin 40 MG tablet Commonly known as: LIPITOR Take 40 mg by mouth daily at 6 PM.   budesonide-formoterol 160-4.5 MCG/ACT inhaler Commonly known as: SYMBICORT SMARTSIG:2 Puff(s) By Mouth Twice Daily   Carafate 1 GM/10ML suspension Generic drug: sucralfate Take 1 g by mouth 4 (four) times daily.   carvedilol 25 MG tablet Commonly known as: COREG Take 25 mg by mouth 2 (two) times daily.   chlorpheniramine-HYDROcodone 10-8 MG/5ML SMARTSIG:5 Milliliter(s) By Mouth Every 12 Hours   [employer] Fiber Gummies 2 g Chew Chew by mouth daily.   diazepam 5 MG tablet Commonly known as: VALIUM Take by mouth at bedtime as needed.   dicyclomine 10 MG capsule Commonly known as: BENTYL TAKE 1 CAPSULE BY MOUTH 3 TIMES A DAY BEFORE MEALS   dronabinol 2.5 MG capsule Commonly known as: MARINOL Take 2.5 mg by mouth 2 (two) times daily before lunch and supper.   gabapentin 300 MG capsule Commonly known as: NEURONTIN TAKE 1 CAPSULE BY MOUTH THREE TIMES A DAY What changed:  how much to take how to take this when  to take this additional instructions   glimepiride 2 MG tablet Commonly known as: AMARYL Take 2 mg by mouth daily.   hydrocortisone 2.5 % cream Apply topically 2 (two) times daily.   lactulose 10 GM/15ML solution Commonly known as: CHRONULAC Take 30 mLs (20 g total) by mouth daily.   latanoprost 0.005 % ophthalmic solution Commonly known as: XALATAN Place 1 drop into both eyes at bedtime.   linaclotide 145 MCG Caps capsule Commonly known as: LINZESS Take by mouth daily as needed.   losartan 50 MG tablet Commonly known as: COZAAR Take 50 mg by mouth daily.   Lumigan 0.01 % Soln Generic drug: bimatoprost Place 1 drop into both eyes 3 (three) times daily.    mesalamine 1.2 g EC tablet Commonly known as: LIALDA Take by mouth daily with breakfast.   metoprolol succinate 25 MG 24 hr tablet Commonly known as: TOPROL-XL Take 25 mg by mouth daily.   mirabegron ER 25 MG Tb24 tablet Commonly known as: MYRBETRIQ Take 20 mg by mouth daily.   morphine 30 MG 12 hr tablet Commonly known as: MS CONTIN Take 30 mg by mouth 2 (two) times daily.   omeprazole 40 MG capsule Commonly known as: PRILOSEC TAKE 1 CAPSULE BY MOUTH TWICE A DAY   ondansetron 8 MG tablet Commonly known as: ZOFRAN TAKE 1 TABLET(8 MG) BY MOUTH TWICE DAILY AS NEEDED FOR NAUSEA OR VOMITING   oxyCODONE 5 MG immediate release tablet Commonly known as: Roxicodone 1 tab PO q6 hours prn pain   oxyCODONE-acetaminophen 10-325 MG tablet Commonly known as: PERCOCET Take 1 tablet by mouth every 6 (six) hours as needed for pain.   pioglitazone 30 MG tablet Commonly known as: ACTOS Take 30 mg by mouth daily.   polyethylene glycol powder 17 GM/SCOOP powder Commonly known as: GLYCOLAX/MIRALAX SMARTSIG:1 scoopful By Mouth Twice Daily PRN   prochlorperazine 10 MG tablet Commonly known as: COMPAZINE take 1 tablet by mouth every 6 hours if needed for nausea and vomiting   Restasis 0.05 % ophthalmic emulsion Generic drug: cycloSPORINE Place 1 drop into both eyes daily.   SUMAtriptan 100 MG tablet Commonly known as: IMITREX Take 100 mg by mouth daily.   tadalafil 10 MG tablet Commonly known as: CIALIS Take by mouth.   topiramate 25 MG capsule Commonly known as: TOPAMAX Take 25 mg by mouth 2 (two) times daily.   torsemide 20 MG tablet Commonly known as: DEMADEX Take 20 mg by mouth daily.   Trulicity 0.34 JZ/7.9XT Sopn Generic drug: Dulaglutide Inject 0.75 mg into the skin every Saturday.   Vitamin D (Ergocalciferol) 1.25 MG (50000 UNIT) Caps capsule Commonly known as: DRISDOL Take 50,000 Units by mouth every Saturday.        Allergies:  Allergies  Allergen  Reactions   Iodinated Contrast Media Rash    Patient states he was instructed not to take IV contrast.  In 2008 he had an unknown reaction, and was told not to take it again.  He was also told not to take it due to his kidneys.   Iodine Anxiety, Rash and Other (See Comments)    Didn't feel right "instructed not to take per MD--something with his port"     Past Medical History, Surgical history, Social history, and Family History were reviewed and updated.  Review of Systems: All other 10 point review of systems is negative.   Physical Exam:  weight is 174 lb 1.9 oz (79 kg). His oral temperature is 98.2 F (36.8  C). His blood pressure is 124/67 and his pulse is 67. His respiration is 18 and oxygen saturation is 100%.   Wt Readings from Last 3 Encounters:  08/27/21 174 lb 1.9 oz (79 kg)  07/30/21 181 lb 1.3 oz (82.1 kg)  06/30/21 185 lb 0.8 oz (83.9 kg)    Ocular: Sclerae unicteric, pupils equal, round and reactive to light Ear-nose-throat: Oropharynx clear, dentition fair Lymphatic: No cervical or supraclavicular adenopathy Lungs no rales or rhonchi, good excursion bilaterally Heart regular rate and rhythm, no murmur appreciated Abd soft, nontender, positive bowel sounds MSK no focal spinal tenderness, no joint edema Neuro: non-focal, well-oriented, appropriate affect Breasts: Deferred   Lab Results  Component Value Date   WBC 3.7 (L) 08/27/2021   HGB 9.8 (L) 08/27/2021   HCT 30.6 (L) 08/27/2021   MCV 77.9 (L) 08/27/2021   PLT 166 08/27/2021   Lab Results  Component Value Date   FERRITIN 1,152 (H) 07/30/2021   IRON 78 07/30/2021   TIBC 231 (L) 07/30/2021   UIBC 153 07/30/2021   IRONPCTSAT 34 07/30/2021   Lab Results  Component Value Date   RETICCTPCT 0.7 08/27/2021   RBC 3.95 (L) 08/27/2021   RETICCTABS 27.5 03/21/2014   Lab Results  Component Value Date   KPAFRELGTCHN 48.7 (H) 07/30/2021   LAMBDASER 24.9 07/30/2021   KAPLAMBRATIO 1.96 (H) 07/30/2021   Lab  Results  Component Value Date   IGGSERUM 989 07/30/2021   IGGSERUM 1,006 07/30/2021   IGA 232 07/30/2021   IGA 245 07/30/2021   IGMSERUM 42 07/30/2021   IGMSERUM 36 07/30/2021   Lab Results  Component Value Date   TOTALPROTELP 6.6 07/30/2021   ALBUMINELP 3.9 07/30/2021   A1GS 0.2 07/30/2021   A2GS 0.8 07/30/2021   BETS 0.9 07/30/2021   BETA2SER 0.5 11/07/2014   GAMS 0.8 07/30/2021   MSPIKE Not Observed 07/30/2021   SPEI Comment 04/07/2021     Chemistry      Component Value Date/Time   NA 143 07/30/2021 0815   NA 143 02/22/2017 0744   K 3.6 07/30/2021 0815   K 4.0 02/22/2017 0744   CL 104 07/30/2021 0815   CL 106 02/22/2017 0744   CO2 30 07/30/2021 0815   CO2 27 02/22/2017 0744   BUN 25 (H) 07/30/2021 0815   BUN 26 (H) 02/22/2017 0744   CREATININE 2.45 (H) 07/30/2021 0815   CREATININE 2.3 (H) 02/22/2017 0744      Component Value Date/Time   CALCIUM 9.0 07/30/2021 0815   CALCIUM 9.3 02/22/2017 0744   ALKPHOS 69 07/30/2021 0815   ALKPHOS 80 02/22/2017 0744   AST 20 07/30/2021 0815   ALT 11 07/30/2021 0815   ALT 22 02/22/2017 0744   BILITOT 0.4 07/30/2021 0815       Impression and Plan: Mr. Sakata is a very pleasant 71 yo African American gentleman with IgG kappa myeloma.  We will proceed with treatment. Calcium is slightly low at 8.7 so we will hold Aredia.  Iron and protein studies are pending.  We will have him try Marinol to boost his appetite and combat weight loss.  Follow-up in 1 month.   Lottie Dawson, NP 7/5/20238:44 AM

## 2021-08-28 LAB — KAPPA/LAMBDA LIGHT CHAINS
Kappa free light chain: 49.8 mg/L — ABNORMAL HIGH (ref 3.3–19.4)
Kappa, lambda light chain ratio: 2 — ABNORMAL HIGH (ref 0.26–1.65)
Lambda free light chains: 24.9 mg/L (ref 5.7–26.3)

## 2021-08-28 LAB — IGG, IGA, IGM
IgA: 229 mg/dL (ref 61–437)
IgG (Immunoglobin G), Serum: 982 mg/dL (ref 603–1613)
IgM (Immunoglobulin M), Srm: 39 mg/dL (ref 15–143)

## 2021-08-28 LAB — TESTOSTERONE: Testosterone: 577 ng/dL (ref 264–916)

## 2021-08-29 LAB — PROTEIN ELECTROPHORESIS, SERUM
A/G Ratio: 1.3 (ref 0.7–1.7)
Albumin ELP: 3.6 g/dL (ref 2.9–4.4)
Alpha-1-Globulin: 0.2 g/dL (ref 0.0–0.4)
Alpha-2-Globulin: 0.9 g/dL (ref 0.4–1.0)
Beta Globulin: 0.8 g/dL (ref 0.7–1.3)
Gamma Globulin: 0.9 g/dL (ref 0.4–1.8)
Globulin, Total: 2.7 g/dL (ref 2.2–3.9)
Total Protein ELP: 6.3 g/dL (ref 6.0–8.5)

## 2021-09-15 ENCOUNTER — Other Ambulatory Visit: Payer: Self-pay

## 2021-09-23 ENCOUNTER — Other Ambulatory Visit: Payer: Self-pay

## 2021-09-24 ENCOUNTER — Inpatient Hospital Stay: Payer: 59

## 2021-09-24 ENCOUNTER — Inpatient Hospital Stay: Payer: 59 | Attending: Hematology & Oncology

## 2021-09-24 ENCOUNTER — Inpatient Hospital Stay (HOSPITAL_BASED_OUTPATIENT_CLINIC_OR_DEPARTMENT_OTHER): Payer: 59 | Admitting: Family

## 2021-09-24 ENCOUNTER — Telehealth: Payer: Self-pay | Admitting: *Deleted

## 2021-09-24 VITALS — BP 127/84 | HR 82 | Temp 97.8°F | Resp 20 | Ht 71.0 in | Wt 169.4 lb

## 2021-09-24 DIAGNOSIS — N189 Chronic kidney disease, unspecified: Secondary | ICD-10-CM | POA: Diagnosis not present

## 2021-09-24 DIAGNOSIS — Z299 Encounter for prophylactic measures, unspecified: Secondary | ICD-10-CM

## 2021-09-24 DIAGNOSIS — E349 Endocrine disorder, unspecified: Secondary | ICD-10-CM | POA: Diagnosis not present

## 2021-09-24 DIAGNOSIS — E230 Hypopituitarism: Secondary | ICD-10-CM | POA: Insufficient documentation

## 2021-09-24 DIAGNOSIS — D509 Iron deficiency anemia, unspecified: Secondary | ICD-10-CM

## 2021-09-24 DIAGNOSIS — D631 Anemia in chronic kidney disease: Secondary | ICD-10-CM

## 2021-09-24 DIAGNOSIS — Z5112 Encounter for antineoplastic immunotherapy: Secondary | ICD-10-CM | POA: Diagnosis present

## 2021-09-24 DIAGNOSIS — D500 Iron deficiency anemia secondary to blood loss (chronic): Secondary | ICD-10-CM

## 2021-09-24 DIAGNOSIS — M4712 Other spondylosis with myelopathy, cervical region: Secondary | ICD-10-CM

## 2021-09-24 DIAGNOSIS — C9000 Multiple myeloma not having achieved remission: Secondary | ICD-10-CM

## 2021-09-24 DIAGNOSIS — Z5111 Encounter for antineoplastic chemotherapy: Secondary | ICD-10-CM | POA: Insufficient documentation

## 2021-09-24 LAB — CBC WITH DIFFERENTIAL (CANCER CENTER ONLY)
Abs Immature Granulocytes: 0.03 10*3/uL (ref 0.00–0.07)
Basophils Absolute: 0.1 10*3/uL (ref 0.0–0.1)
Basophils Relative: 1 %
Eosinophils Absolute: 0.1 10*3/uL (ref 0.0–0.5)
Eosinophils Relative: 4 %
HCT: 33.3 % — ABNORMAL LOW (ref 39.0–52.0)
Hemoglobin: 10.5 g/dL — ABNORMAL LOW (ref 13.0–17.0)
Immature Granulocytes: 1 %
Lymphocytes Relative: 23 %
Lymphs Abs: 0.8 10*3/uL (ref 0.7–4.0)
MCH: 25 pg — ABNORMAL LOW (ref 26.0–34.0)
MCHC: 31.5 g/dL (ref 30.0–36.0)
MCV: 79.3 fL — ABNORMAL LOW (ref 80.0–100.0)
Monocytes Absolute: 0.4 10*3/uL (ref 0.1–1.0)
Monocytes Relative: 12 %
Neutro Abs: 2.2 10*3/uL (ref 1.7–7.7)
Neutrophils Relative %: 59 %
Platelet Count: 175 10*3/uL (ref 150–400)
RBC: 4.2 MIL/uL — ABNORMAL LOW (ref 4.22–5.81)
RDW: 17.7 % — ABNORMAL HIGH (ref 11.5–15.5)
WBC Count: 3.6 10*3/uL — ABNORMAL LOW (ref 4.0–10.5)
nRBC: 0 % (ref 0.0–0.2)

## 2021-09-24 LAB — CMP (CANCER CENTER ONLY)
ALT: 20 U/L (ref 0–44)
AST: 20 U/L (ref 15–41)
Albumin: 4.2 g/dL (ref 3.5–5.0)
Alkaline Phosphatase: 76 U/L (ref 38–126)
Anion gap: 8 (ref 5–15)
BUN: 28 mg/dL — ABNORMAL HIGH (ref 8–23)
CO2: 30 mmol/L (ref 22–32)
Calcium: 9.1 mg/dL (ref 8.9–10.3)
Chloride: 103 mmol/L (ref 98–111)
Creatinine: 2.51 mg/dL — ABNORMAL HIGH (ref 0.61–1.24)
GFR, Estimated: 27 mL/min — ABNORMAL LOW (ref >60)
Glucose, Bld: 92 mg/dL (ref 70–99)
Potassium: 4.2 mmol/L (ref 3.5–5.1)
Sodium: 141 mmol/L (ref 135–145)
Total Bilirubin: 0.3 mg/dL (ref 0.3–1.2)
Total Protein: 6.8 g/dL (ref 6.5–8.1)

## 2021-09-24 LAB — RETICULOCYTES
Immature Retic Fract: 9.8 % (ref 2.3–15.9)
RBC.: 4.18 MIL/uL — ABNORMAL LOW (ref 4.22–5.81)
Retic Count, Absolute: 25.1 10*3/uL (ref 19.0–186.0)
Retic Ct Pct: 0.6 % (ref 0.4–3.1)

## 2021-09-24 LAB — FERRITIN: Ferritin: 1395 ng/mL — ABNORMAL HIGH (ref 24–336)

## 2021-09-24 LAB — IRON AND IRON BINDING CAPACITY (CC-WL,HP ONLY)
Iron: 98 ug/dL (ref 45–182)
Saturation Ratios: 44 % — ABNORMAL HIGH (ref 17.9–39.5)
TIBC: 224 ug/dL — ABNORMAL LOW (ref 250–450)
UIBC: 126 ug/dL (ref 117–376)

## 2021-09-24 LAB — LACTATE DEHYDROGENASE: LDH: 214 U/L — ABNORMAL HIGH (ref 98–192)

## 2021-09-24 MED ORDER — SODIUM CHLORIDE 0.9 % IV SOLN
300.0000 mg/m2 | Freq: Once | INTRAVENOUS | Status: AC
  Administered 2021-09-24: 620 mg via INTRAVENOUS
  Filled 2021-09-24: qty 31

## 2021-09-24 MED ORDER — DEXTROSE 5 % IV SOLN
28.6000 mg/m2 | Freq: Once | INTRAVENOUS | Status: AC
  Administered 2021-09-24: 60 mg via INTRAVENOUS
  Filled 2021-09-24: qty 30

## 2021-09-24 MED ORDER — HYDROMORPHONE HCL 1 MG/ML IJ SOLN: 2.0000 mg | Freq: Once | INTRAMUSCULAR | Status: DC

## 2021-09-24 MED ORDER — SODIUM CHLORIDE 0.9 % IV SOLN
10.0000 mg | Freq: Once | INTRAVENOUS | Status: AC
  Administered 2021-09-24: 10 mg via INTRAVENOUS
  Filled 2021-09-24: qty 10

## 2021-09-24 MED ORDER — TESTOSTERONE CYPIONATE 200 MG/ML IM SOLN
400.0000 mg | Freq: Once | INTRAMUSCULAR | Status: AC
  Administered 2021-09-24: 400 mg via INTRAMUSCULAR
  Filled 2021-09-24: qty 2

## 2021-09-24 MED ORDER — SODIUM CHLORIDE 0.9% FLUSH
10.0000 mL | INTRAVENOUS | Status: DC | PRN
  Administered 2021-09-24: 10 mL

## 2021-09-24 MED ORDER — PALONOSETRON HCL INJECTION 0.25 MG/5ML
0.2500 mg | Freq: Once | INTRAVENOUS | Status: AC
  Administered 2021-09-24: 0.25 mg via INTRAVENOUS
  Filled 2021-09-24: qty 5

## 2021-09-24 MED ORDER — SODIUM CHLORIDE 0.9 % IV SOLN
60.0000 mg | Freq: Once | INTRAVENOUS | Status: AC
  Administered 2021-09-24: 60 mg via INTRAVENOUS
  Filled 2021-09-24: qty 10

## 2021-09-24 MED ORDER — HYDROMORPHONE HCL 1 MG/ML IJ SOLN
2.0000 mg | Freq: Once | INTRAMUSCULAR | Status: AC
  Administered 2021-09-24: 2 mg via INTRAVENOUS
  Filled 2021-09-24: qty 2

## 2021-09-24 MED ORDER — HEPARIN SOD (PORK) LOCK FLUSH 100 UNIT/ML IV SOLN
500.0000 [IU] | Freq: Once | INTRAVENOUS | Status: AC | PRN
  Administered 2021-09-24: 500 [IU]

## 2021-09-24 MED ORDER — SODIUM CHLORIDE 0.9 % IV SOLN: Freq: Once | INTRAVENOUS | Status: AC

## 2021-09-24 MED ORDER — CYANOCOBALAMIN 1000 MCG/ML IJ SOLN
1000.0000 ug | Freq: Once | INTRAMUSCULAR | Status: AC
  Administered 2021-09-24: 1000 ug via INTRAMUSCULAR
  Filled 2021-09-24: qty 1

## 2021-09-24 MED ORDER — HYDROMORPHONE HCL 1 MG/ML IJ SOLN
INTRAMUSCULAR | Status: AC
  Filled 2021-09-24: qty 1

## 2021-09-24 NOTE — Progress Notes (Signed)
OK to treat with creat-2.51 per order of Dr. [name].

## 2021-09-24 NOTE — Patient Instructions (Signed)
[facility]  Discharge Instructions: Thank you for choosing [facility] to provide your oncology and hematology care.   If you have a lab appointment with the Cancer Center, please go directly to the Cancer Center and check in at the registration area.  Wear comfortable clothing and clothing appropriate for easy access to any Portacath or PICC line.   We strive to give you quality time with your provider. You may need to reschedule your appointment if you arrive late (15 or more minutes).  Arriving late affects you and other patients whose appointments are after yours.  Also, if you miss three or more appointments without notifying the office, you may be dismissed from the clinic at the provider's discretion.      For prescription refill requests, have your pharmacy contact our office and allow 72 hours for refills to be completed.    Today you received the following chemotherapy and/or immunotherapy agents Kyprolis and Cytoxan.      To help prevent nausea and vomiting after your treatment, we encourage you to take your nausea medication as directed.  BELOW ARE SYMPTOMS THAT SHOULD BE REPORTED IMMEDIATELY: *FEVER GREATER THAN 100.4 F (38 C) OR HIGHER *CHILLS OR SWEATING *NAUSEA AND VOMITING THAT IS NOT CONTROLLED WITH YOUR NAUSEA MEDICATION *UNUSUAL SHORTNESS OF BREATH *UNUSUAL BRUISING OR BLEEDING *URINARY PROBLEMS (pain or burning when urinating, or frequent urination) *BOWEL PROBLEMS (unusual diarrhea, constipation, pain near the anus) TENDERNESS IN MOUTH AND THROAT WITH OR WITHOUT PRESENCE OF ULCERS (sore throat, sores in mouth, or a toothache) UNUSUAL RASH, SWELLING OR PAIN  UNUSUAL VAGINAL DISCHARGE OR ITCHING   Items with * indicate a potential emergency and should be followed up as soon as possible or go to the Emergency Department if any problems should occur.  Please show the CHEMOTHERAPY ALERT CARD or IMMUNOTHERAPY ALERT CARD at  check-in to the Emergency Department and triage nurse. Should you have questions after your visit or need to cancel or reschedule your appointment, please contact [facility]  [phone] and follow the prompts.  Office hours are 8:00 a.m. to 4:30 p.m. Monday - Friday. Please note that voicemails left after 4:00 p.m. may not be returned until the following business day.  We are closed weekends and major holidays. You have access to a nurse at all times for urgent questions. Please call the main number to the clinic [phone] and follow the prompts.  For any non-urgent questions, you may also contact your provider using MyChart. We now offer e-Visits for anyone 18 and older to request care online for non-urgent symptoms. For details visit [email or web address].   Also download the MyChart app! Go to the app store, search "MyChart", open the app, select [facility], and log in with your MyChart username and password.  Masks are optional in the cancer centers. If you would like for your care team to wear a mask while they are taking care of you, please let them know. You may have one support person who is at least 72 years old accompany you for your appointments. 

## 2021-09-24 NOTE — Progress Notes (Signed)
Hematology and Oncology Follow Up Visit  [name] [id number] 1950-08-21 71 y.o. 09/24/2021   Principle Diagnosis:  IgG kappa myeloma Anemia secondary to renal insufficiency Intermittent iron - deficiency anemia Hypotestosteronemia   Current Therapy:        Aredia 60 mg IV q  month -- Retacrit SQ as needed for hemoglobin less than 10 DepoTestosterone 400 mg q 4 weeks IV iron as indicated Kyprolis/Cytoxan - s/p cycle 33   Interim History:  [name] is here today for follow-up and treatment. He just got back from a wonderful trip to the beach with his friend.  He is feeling pretty good today. He still has generalized aches and pains with arthritis but it getting around well.  He has started working out at [employer] with his son and notes that seems to have helped his mobility.  Last month, no M-spike was detected, IgG level was 982 mg/dL and kappa light chains 4.98 mg/dL.  No fever, chills, n/v, cough, rash, dizziness, SOB, chest pain, palpitations, abdominal pain or changes in bowel or bladder habits.  Neuropathy in his hands and feet is unchanged from baseline.  Appetite and hydration have been ok. Weight is 169 which he attributes to eating less and working out.   ECOG Performance Status: 1 - Symptomatic but completely ambulatory  Medications:  Allergies as of 09/24/2021       Reactions   Iodinated Contrast Media Rash   Patient states he was instructed not to take IV contrast.  In 2008 he had an unknown reaction, and was told not to take it again.  He was also told not to take it due to his kidneys.   Iodine Anxiety, Rash, Other (See Comments)   Didn't feel right "instructed not to take per MD--something with his port"        Medication List        Accurate as of September 24, 2021  8:48 AM. If you have any questions, ask your nurse or doctor.          amLODipine 10 MG tablet Commonly known as: NORVASC Take 10 mg by mouth daily.   ammonium lactate 12 %  lotion Commonly known as: AmLactin Apply 1 application topically as needed for dry skin.   atorvastatin 40 MG tablet Commonly known as: LIPITOR Take 40 mg by mouth daily at 6 PM.   budesonide-formoterol 160-4.5 MCG/ACT inhaler Commonly known as: SYMBICORT SMARTSIG:2 Puff(s) By Mouth Twice Daily   Carafate 1 GM/10ML suspension Generic drug: sucralfate Take 1 g by mouth 4 (four) times daily.   carvedilol 25 MG tablet Commonly known as: COREG Take 25 mg by mouth 2 (two) times daily.   chlorpheniramine-HYDROcodone 10-8 MG/5ML SMARTSIG:5 Milliliter(s) By Mouth Every 12 Hours   [employer] Fiber Gummies 2 g Chew Chew by mouth daily.   diazepam 5 MG tablet Commonly known as: VALIUM Take by mouth at bedtime as needed.   dicyclomine 10 MG capsule Commonly known as: BENTYL TAKE 1 CAPSULE BY MOUTH 3 TIMES A DAY BEFORE MEALS   dronabinol 5 MG capsule Commonly known as: MARINOL Take 1 capsule (5 mg total) by mouth 2 (two) times daily before a meal.   gabapentin 300 MG capsule Commonly known as: NEURONTIN TAKE 1 CAPSULE BY MOUTH THREE TIMES A DAY What changed:  how much to take how to take this when to take this additional instructions   glimepiride 2 MG tablet Commonly known as: AMARYL Take 2 mg by mouth daily.  hydrocortisone 2.5 % cream Apply topically 2 (two) times daily.   lactulose 10 GM/15ML solution Commonly known as: CHRONULAC Take 30 mLs (20 g total) by mouth daily.   latanoprost 0.005 % ophthalmic solution Commonly known as: XALATAN Place 1 drop into both eyes at bedtime.   linaclotide 145 MCG Caps capsule Commonly known as: LINZESS Take by mouth daily as needed.   losartan 50 MG tablet Commonly known as: COZAAR Take 50 mg by mouth daily.   Lumigan 0.01 % Soln Generic drug: bimatoprost Place 1 drop into both eyes 3 (three) times daily.   mesalamine 1.2 g EC tablet Commonly known as: LIALDA Take by mouth daily with breakfast.   metoprolol succinate  25 MG 24 hr tablet Commonly known as: TOPROL-XL Take 25 mg by mouth daily.   mirabegron ER 25 MG Tb24 tablet Commonly known as: MYRBETRIQ Take 20 mg by mouth daily.   morphine 30 MG 12 hr tablet Commonly known as: MS CONTIN Take 30 mg by mouth 2 (two) times daily.   omeprazole 40 MG capsule Commonly known as: PRILOSEC TAKE 1 CAPSULE BY MOUTH TWICE A DAY   ondansetron 8 MG tablet Commonly known as: ZOFRAN TAKE 1 TABLET(8 MG) BY MOUTH TWICE DAILY AS NEEDED FOR NAUSEA OR VOMITING   oxyCODONE 5 MG immediate release tablet Commonly known as: Roxicodone 1 tab PO q6 hours prn pain   oxyCODONE-acetaminophen 10-325 MG tablet Commonly known as: PERCOCET Take 1 tablet by mouth every 6 (six) hours as needed for pain.   pioglitazone 30 MG tablet Commonly known as: ACTOS Take 30 mg by mouth daily.   polyethylene glycol powder 17 GM/SCOOP powder Commonly known as: GLYCOLAX/MIRALAX SMARTSIG:1 scoopful By Mouth Twice Daily PRN   prochlorperazine 10 MG tablet Commonly known as: COMPAZINE take 1 tablet by mouth every 6 hours if needed for nausea and vomiting   Restasis 0.05 % ophthalmic emulsion Generic drug: cycloSPORINE Place 1 drop into both eyes daily.   SUMAtriptan 100 MG tablet Commonly known as: IMITREX Take 100 mg by mouth daily.   tadalafil 10 MG tablet Commonly known as: CIALIS Take by mouth.   topiramate 25 MG capsule Commonly known as: TOPAMAX Take 25 mg by mouth 2 (two) times daily.   topiramate 50 MG tablet Commonly known as: TOPAMAX Take 50 mg by mouth at bedtime.   torsemide 20 MG tablet Commonly known as: DEMADEX Take 20 mg by mouth daily.   Trulicity 0.16 WF/0.9NA Sopn Generic drug: Dulaglutide Inject 0.75 mg into the skin every Saturday.   Vitamin D (Ergocalciferol) 1.25 MG (50000 UNIT) Caps capsule Commonly known as: DRISDOL Take 50,000 Units by mouth every Saturday.        Allergies:  Allergies  Allergen Reactions   Iodinated Contrast  Media Rash    Patient states he was instructed not to take IV contrast.  In 2008 he had an unknown reaction, and was told not to take it again.  He was also told not to take it due to his kidneys.   Iodine Anxiety, Rash and Other (See Comments)    Didn't feel right "instructed not to take per MD--something with his port"     Past Medical History, Surgical history, Social history, and Family History were reviewed and updated.  Review of Systems: All other 10 point review of systems is negative.   Physical Exam:  height is '5\' 11"'$  (1.803 m) and weight is 169 lb 6.4 oz (76.8 kg). His temperature is 97.8 F (36.6  C). His blood pressure is 127/84 and his pulse is 82. His respiration is 20 and oxygen saturation is 100%.   Wt Readings from Last 3 Encounters:  09/24/21 169 lb 6.4 oz (76.8 kg)  08/27/21 174 lb 1.9 oz (79 kg)  07/30/21 181 lb 1.3 oz (82.1 kg)    Ocular: Sclerae unicteric, pupils equal, round and reactive to light Ear-nose-throat: Oropharynx clear, dentition fair Lymphatic: No cervical or supraclavicular adenopathy Lungs no rales or rhonchi, good excursion bilaterally Heart regular rate and rhythm, no murmur appreciated Abd soft, nontender, positive bowel sounds MSK no focal spinal tenderness, no joint edema Neuro: non-focal, well-oriented, appropriate affect Breasts: Deferred  Lab Results  Component Value Date   WBC 3.6 (L) 09/24/2021   HGB 10.5 (L) 09/24/2021   HCT 33.3 (L) 09/24/2021   MCV 79.3 (L) 09/24/2021   PLT 175 09/24/2021   Lab Results  Component Value Date   FERRITIN 945 (H) 08/27/2021   IRON 70 08/27/2021   TIBC 213 (L) 08/27/2021   UIBC 143 08/27/2021   IRONPCTSAT 33 08/27/2021   Lab Results  Component Value Date   RETICCTPCT 0.6 09/24/2021   RBC 4.20 (L) 09/24/2021   RBC 4.18 (L) 09/24/2021   RETICCTABS 27.5 03/21/2014   Lab Results  Component Value Date   KPAFRELGTCHN 49.8 (H) 08/27/2021   LAMBDASER 24.9 08/27/2021   KAPLAMBRATIO 2.00  (H) 08/27/2021   Lab Results  Component Value Date   IGGSERUM 982 08/27/2021   IGA 229 08/27/2021   IGMSERUM 39 08/27/2021   Lab Results  Component Value Date   TOTALPROTELP 6.3 08/27/2021   ALBUMINELP 3.6 08/27/2021   A1GS 0.2 08/27/2021   A2GS 0.9 08/27/2021   BETS 0.8 08/27/2021   BETA2SER 0.5 11/07/2014   GAMS 0.9 08/27/2021   MSPIKE Not Observed 08/27/2021   SPEI Comment 08/27/2021     Chemistry      Component Value Date/Time   NA 141 09/24/2021 0745   NA 143 02/22/2017 0744   K 4.2 09/24/2021 0745   K 4.0 02/22/2017 0744   CL 103 09/24/2021 0745   CL 106 02/22/2017 0744   CO2 30 09/24/2021 0745   CO2 27 02/22/2017 0744   BUN 28 (H) 09/24/2021 0745   BUN 26 (H) 02/22/2017 0744   CREATININE 2.51 (H) 09/24/2021 0745   CREATININE 2.3 (H) 02/22/2017 0744      Component Value Date/Time   CALCIUM 9.1 09/24/2021 0745   CALCIUM 9.3 02/22/2017 0744   ALKPHOS 76 09/24/2021 0745   ALKPHOS 80 02/22/2017 0744   AST 20 09/24/2021 0745   ALT 20 09/24/2021 0745   ALT 22 02/22/2017 0744   BILITOT 0.3 09/24/2021 0745       Impression and Plan: [name] is a very pleasant 71 yo African American gentleman with IgG kappa myeloma.  We will proceed with treatment today as planned.  Follow-up in 1 month.   Lottie Dawson, NP 8/2/20238:48 AM

## 2021-09-24 NOTE — Telephone Encounter (Signed)
Per 09/24/21 los - gave upcoming appointments - confirmed

## 2021-09-25 ENCOUNTER — Other Ambulatory Visit: Payer: Self-pay

## 2021-09-25 LAB — KAPPA/LAMBDA LIGHT CHAINS
Kappa free light chain: 53.1 mg/L — ABNORMAL HIGH (ref 3.3–19.4)
Kappa, lambda light chain ratio: 1.91 — ABNORMAL HIGH (ref 0.26–1.65)
Lambda free light chains: 27.8 mg/L — ABNORMAL HIGH (ref 5.7–26.3)

## 2021-09-25 LAB — IGG, IGA, IGM
IgA: 242 mg/dL (ref 61–437)
IgG (Immunoglobin G), Serum: 1012 mg/dL (ref 603–1613)
IgM (Immunoglobulin M), Srm: 48 mg/dL (ref 15–143)

## 2021-09-29 LAB — PROTEIN ELECTROPHORESIS, SERUM
A/G Ratio: 1.2 (ref 0.7–1.7)
Albumin ELP: 3.6 g/dL (ref 2.9–4.4)
Alpha-1-Globulin: 0.2 g/dL (ref 0.0–0.4)
Alpha-2-Globulin: 0.9 g/dL (ref 0.4–1.0)
Beta Globulin: 0.9 g/dL (ref 0.7–1.3)
Gamma Globulin: 1 g/dL (ref 0.4–1.8)
Globulin, Total: 3.1 g/dL (ref 2.2–3.9)
Total Protein ELP: 6.7 g/dL (ref 6.0–8.5)

## 2021-10-10 ENCOUNTER — Ambulatory Visit (INDEPENDENT_AMBULATORY_CARE_PROVIDER_SITE_OTHER): Payer: 59 | Admitting: Podiatry

## 2021-10-10 ENCOUNTER — Encounter: Payer: Self-pay | Admitting: Podiatry

## 2021-10-10 DIAGNOSIS — M79675 Pain in left toe(s): Secondary | ICD-10-CM

## 2021-10-10 DIAGNOSIS — M2010 Hallux valgus (acquired), unspecified foot: Secondary | ICD-10-CM

## 2021-10-10 DIAGNOSIS — M79674 Pain in right toe(s): Secondary | ICD-10-CM | POA: Diagnosis not present

## 2021-10-10 DIAGNOSIS — E1151 Type 2 diabetes mellitus with diabetic peripheral angiopathy without gangrene: Secondary | ICD-10-CM | POA: Diagnosis not present

## 2021-10-10 DIAGNOSIS — B351 Tinea unguium: Secondary | ICD-10-CM | POA: Diagnosis not present

## 2021-10-10 DIAGNOSIS — L84 Corns and callosities: Secondary | ICD-10-CM

## 2021-10-10 NOTE — Progress Notes (Signed)
This patient returns to my office for at risk foot care.  This patient requires this care by a professional since this patient will be at risk due to having diabetes type 2.  This patient is unable to cut nails himself since the patient cannot reach his nails.These nails are painful walking and wearing shoes.  Painful callus under outside ball of both feet.This patient presents for at risk foot care today.  General Appearance  Alert, conversant and in no acute stress.  Vascular  Dorsalis pedis and posterior tibial  pulses are weakly  palpable  bilaterally.  Capillary return is within normal limits  bilaterally. Cold feet bilaterally.  Absent digital hair.  Neurologic  Senn-Weinstein monofilament wire test within normal limits/diminished right.  LOPS absent left foot. Muscle power within normal limits bilaterally.  Nails Thick disfigured discolored nails with subungual debris  from hallux to fifth toes bilaterally. No evidence of bacterial infection or drainage bilaterally.  Orthopedic  No limitations of motion  feet .  No crepitus or effusions noted.  No bony pathology or digital deformities noted.  HAV  B/L.  Exostosis right foot.Plantar flexed fifth metatarsall B/l.  Skin  normotropic skin with no porokeratosis noted bilaterally.  No signs of infections or ulcers noted.  Callus sub 5th met  B/l. No infection.  Onychomycosis  Pain in right toes  Pain in left toes  Porokeratosis  B/L  Consent was obtained for treatment procedures.   Mechanical debridement of nails 1-5  bilaterally performed with a nail nipper.  Filed with dremel without incident. Debride callus sub 5th  B/L with # 15 blade.   Return office visit    10 weeks                  Told patient to return for periodic foot care and evaluation due to potential at risk complications.   Analisa Sledd DPM  

## 2021-10-11 ENCOUNTER — Other Ambulatory Visit: Payer: Self-pay

## 2021-10-22 ENCOUNTER — Inpatient Hospital Stay: Payer: 59

## 2021-10-22 ENCOUNTER — Inpatient Hospital Stay: Payer: 59 | Admitting: Family

## 2021-10-24 ENCOUNTER — Other Ambulatory Visit: Payer: 59

## 2021-10-24 ENCOUNTER — Ambulatory Visit: Payer: 59

## 2021-10-24 ENCOUNTER — Inpatient Hospital Stay: Payer: 59

## 2021-10-24 ENCOUNTER — Ambulatory Visit: Payer: 59 | Admitting: Family

## 2021-10-28 ENCOUNTER — Inpatient Hospital Stay: Payer: 59 | Attending: Hematology & Oncology

## 2021-10-28 ENCOUNTER — Encounter: Payer: Self-pay | Admitting: Family

## 2021-10-28 ENCOUNTER — Inpatient Hospital Stay (HOSPITAL_BASED_OUTPATIENT_CLINIC_OR_DEPARTMENT_OTHER): Payer: 59 | Admitting: Family

## 2021-10-28 ENCOUNTER — Inpatient Hospital Stay: Payer: 59

## 2021-10-28 ENCOUNTER — Other Ambulatory Visit: Payer: Self-pay | Admitting: Hematology & Oncology

## 2021-10-28 VITALS — BP 139/67 | HR 79

## 2021-10-28 VITALS — BP 145/70 | HR 96 | Temp 98.9°F | Resp 18 | Wt 168.0 lb

## 2021-10-28 DIAGNOSIS — N189 Chronic kidney disease, unspecified: Secondary | ICD-10-CM

## 2021-10-28 DIAGNOSIS — D631 Anemia in chronic kidney disease: Secondary | ICD-10-CM

## 2021-10-28 DIAGNOSIS — C9000 Multiple myeloma not having achieved remission: Secondary | ICD-10-CM

## 2021-10-28 DIAGNOSIS — Z5112 Encounter for antineoplastic immunotherapy: Secondary | ICD-10-CM | POA: Insufficient documentation

## 2021-10-28 DIAGNOSIS — E291 Testicular hypofunction: Secondary | ICD-10-CM | POA: Insufficient documentation

## 2021-10-28 DIAGNOSIS — E349 Endocrine disorder, unspecified: Secondary | ICD-10-CM

## 2021-10-28 DIAGNOSIS — Z5111 Encounter for antineoplastic chemotherapy: Secondary | ICD-10-CM | POA: Diagnosis present

## 2021-10-28 DIAGNOSIS — Z299 Encounter for prophylactic measures, unspecified: Secondary | ICD-10-CM

## 2021-10-28 DIAGNOSIS — D509 Iron deficiency anemia, unspecified: Secondary | ICD-10-CM

## 2021-10-28 DIAGNOSIS — M4712 Other spondylosis with myelopathy, cervical region: Secondary | ICD-10-CM

## 2021-10-28 DIAGNOSIS — D500 Iron deficiency anemia secondary to blood loss (chronic): Secondary | ICD-10-CM

## 2021-10-28 LAB — RETICULOCYTES
Immature Retic Fract: 8.7 % (ref 2.3–15.9)
RBC.: 4.1 MIL/uL — ABNORMAL LOW (ref 4.22–5.81)
Retic Count, Absolute: 27.1 10*3/uL (ref 19.0–186.0)
Retic Ct Pct: 0.7 % (ref 0.4–3.1)

## 2021-10-28 LAB — CBC WITH DIFFERENTIAL (CANCER CENTER ONLY)
Abs Immature Granulocytes: 0.04 10*3/uL (ref 0.00–0.07)
Basophils Absolute: 0.1 10*3/uL (ref 0.0–0.1)
Basophils Relative: 1 %
Eosinophils Absolute: 0.2 10*3/uL (ref 0.0–0.5)
Eosinophils Relative: 3 %
HCT: 32.7 % — ABNORMAL LOW (ref 39.0–52.0)
Hemoglobin: 10.3 g/dL — ABNORMAL LOW (ref 13.0–17.0)
Immature Granulocytes: 1 %
Lymphocytes Relative: 14 %
Lymphs Abs: 0.9 10*3/uL (ref 0.7–4.0)
MCH: 24.8 pg — ABNORMAL LOW (ref 26.0–34.0)
MCHC: 31.5 g/dL (ref 30.0–36.0)
MCV: 78.8 fL — ABNORMAL LOW (ref 80.0–100.0)
Monocytes Absolute: 0.9 10*3/uL (ref 0.1–1.0)
Monocytes Relative: 14 %
Neutro Abs: 4.4 10*3/uL (ref 1.7–7.7)
Neutrophils Relative %: 67 %
Platelet Count: 189 10*3/uL (ref 150–400)
RBC: 4.15 MIL/uL — ABNORMAL LOW (ref 4.22–5.81)
RDW: 17.2 % — ABNORMAL HIGH (ref 11.5–15.5)
WBC Count: 6.5 10*3/uL (ref 4.0–10.5)
nRBC: 0 % (ref 0.0–0.2)

## 2021-10-28 LAB — CMP (CANCER CENTER ONLY)
ALT: 24 U/L (ref 0–44)
AST: 29 U/L (ref 15–41)
Albumin: 4.4 g/dL (ref 3.5–5.0)
Alkaline Phosphatase: 81 U/L (ref 38–126)
Anion gap: 12 (ref 5–15)
BUN: 31 mg/dL — ABNORMAL HIGH (ref 8–23)
CO2: 27 mmol/L (ref 22–32)
Calcium: 9.1 mg/dL (ref 8.9–10.3)
Chloride: 101 mmol/L (ref 98–111)
Creatinine: 2.44 mg/dL — ABNORMAL HIGH (ref 0.61–1.24)
GFR, Estimated: 27 mL/min — ABNORMAL LOW (ref >60)
Glucose, Bld: 92 mg/dL (ref 70–99)
Potassium: 3.7 mmol/L (ref 3.5–5.1)
Sodium: 140 mmol/L (ref 135–145)
Total Bilirubin: 0.6 mg/dL (ref 0.3–1.2)
Total Protein: 7.5 g/dL (ref 6.5–8.1)

## 2021-10-28 LAB — LACTATE DEHYDROGENASE: LDH: 267 U/L — ABNORMAL HIGH (ref 98–192)

## 2021-10-28 LAB — IRON AND IRON BINDING CAPACITY (CC-WL,HP ONLY)
Iron: 55 ug/dL (ref 45–182)
Saturation Ratios: 25 % (ref 17.9–39.5)
TIBC: 221 ug/dL — ABNORMAL LOW (ref 250–450)
UIBC: 166 ug/dL (ref 117–376)

## 2021-10-28 LAB — FERRITIN: Ferritin: 1284 ng/mL — ABNORMAL HIGH (ref 24–336)

## 2021-10-28 MED ORDER — SODIUM CHLORIDE 0.9 % IV SOLN: Freq: Once | INTRAVENOUS | Status: AC

## 2021-10-28 MED ORDER — PALONOSETRON HCL INJECTION 0.25 MG/5ML
0.2500 mg | Freq: Once | INTRAVENOUS | Status: AC
  Administered 2021-10-28: 0.25 mg via INTRAVENOUS
  Filled 2021-10-28: qty 5

## 2021-10-28 MED ORDER — SODIUM CHLORIDE 0.9 % IV SOLN
10.0000 mg | Freq: Once | INTRAVENOUS | Status: AC
  Administered 2021-10-28: 10 mg via INTRAVENOUS
  Filled 2021-10-28: qty 10

## 2021-10-28 MED ORDER — HYDROMORPHONE HCL 1 MG/ML IJ SOLN
2.0000 mg | Freq: Once | INTRAMUSCULAR | Status: AC
  Administered 2021-10-28: 2 mg via INTRAVENOUS
  Filled 2021-10-28: qty 2

## 2021-10-28 MED ORDER — TESTOSTERONE CYPIONATE 200 MG/ML IM SOLN
400.0000 mg | Freq: Once | INTRAMUSCULAR | Status: AC
  Administered 2021-10-28: 400 mg via INTRAMUSCULAR
  Filled 2021-10-28: qty 2

## 2021-10-28 MED ORDER — CYANOCOBALAMIN 1000 MCG/ML IJ SOLN
1000.0000 ug | Freq: Once | INTRAMUSCULAR | Status: AC
  Administered 2021-10-28: 1000 ug via INTRAMUSCULAR
  Filled 2021-10-28: qty 1

## 2021-10-28 MED ORDER — SODIUM CHLORIDE 0.9% FLUSH
10.0000 mL | INTRAVENOUS | Status: DC | PRN
  Administered 2021-10-28: 10 mL

## 2021-10-28 MED ORDER — SODIUM CHLORIDE 0.9 % IV SOLN
60.0000 mg | Freq: Once | INTRAVENOUS | Status: DC
  Filled 2021-10-28: qty 20

## 2021-10-28 MED ORDER — DEXTROSE 5 % IV SOLN
30.0000 mg/m2 | Freq: Once | INTRAVENOUS | Status: AC
  Administered 2021-10-28: 60 mg via INTRAVENOUS
  Filled 2021-10-28: qty 30

## 2021-10-28 MED ORDER — SODIUM CHLORIDE 0.9 % IV SOLN
300.0000 mg/m2 | Freq: Once | INTRAVENOUS | Status: AC
  Administered 2021-10-28: 580 mg via INTRAVENOUS
  Filled 2021-10-28: qty 29

## 2021-10-28 MED ORDER — HEPARIN SOD (PORK) LOCK FLUSH 100 UNIT/ML IV SOLN
500.0000 [IU] | Freq: Once | INTRAVENOUS | Status: AC | PRN
  Administered 2021-10-28: 500 [IU]

## 2021-10-28 MED ORDER — SODIUM CHLORIDE 0.9 % IV SOLN
60.0000 mg | Freq: Once | INTRAVENOUS | Status: AC
  Administered 2021-10-28: 60 mg via INTRAVENOUS
  Filled 2021-10-28: qty 10

## 2021-10-28 NOTE — Patient Instructions (Signed)

## 2021-10-28 NOTE — Progress Notes (Signed)
Hematology and Oncology Follow Up Visit  [name] [id number] 1950-09-14 71 y.o. 10/28/2021   Principle Diagnosis:  IgG kappa myeloma Anemia secondary to renal insufficiency Intermittent iron - deficiency anemia Hypotestosteronemia   Current Therapy:        Aredia 60 mg IV q  month -- Retacrit SQ as needed for hemoglobin less than 10 DepoTestosterone 400 mg q 4 weeks IV iron as indicated Kyprolis/Cytoxan - s/p cycle 33   Interim History:  [name] is here today for follow-up and treatment. He is having arthritic aches and pains "all over" at this time. This pain waxes and wanes.  M-spike last month was not detected, IgG level was 1,012 mg/dL and kappa light chains 5.31 mg/dL. No fever, chills, n/v, cough, rash, dizziness, SOB, chest pain, palpitations, abdominal pain or changes in bowel or bladder habits.  No blood loss, bruising or petechiae.  No swelling in his extremities. No numbness or tingling at this time.  No falls or syncope to report.  Appetite and hydration have been good. Weight is stable at 168 lbs.   ECOG Performance Status: 1 - Symptomatic but completely ambulatory  Medications:  Allergies as of 10/28/2021       Reactions   Iodinated Contrast Media Rash   Patient states he was instructed not to take IV contrast.  In 2008 he had an unknown reaction, and was told not to take it again.  He was also told not to take it due to his kidneys.   Iodine Anxiety, Rash, Other (See Comments)   Didn't feel right "instructed not to take per MD--something with his port"        Medication List        Accurate as of October 28, 2021  9:40 AM. If you have any questions, ask your nurse or doctor.          STOP taking these medications    ondansetron 8 MG tablet Commonly known as: ZOFRAN Stopped by: Volanda Napoleon, MD   prochlorperazine 10 MG tablet Commonly known as: COMPAZINE Stopped by: Volanda Napoleon, MD       TAKE these medications     amLODipine 10 MG tablet Commonly known as: NORVASC Take 10 mg by mouth daily.   ammonium lactate 12 % lotion Commonly known as: AmLactin Apply 1 application topically as needed for dry skin.   atorvastatin 40 MG tablet Commonly known as: LIPITOR Take 40 mg by mouth daily at 6 PM.   budesonide-formoterol 160-4.5 MCG/ACT inhaler Commonly known as: SYMBICORT SMARTSIG:2 Puff(s) By Mouth Twice Daily   Carafate 1 GM/10ML suspension Generic drug: sucralfate Take 1 g by mouth 4 (four) times daily.   carvedilol 25 MG tablet Commonly known as: COREG Take 25 mg by mouth 2 (two) times daily.   chlorpheniramine-HYDROcodone 10-8 MG/5ML Commonly known as: TUSSIONEX SMARTSIG:5 Milliliter(s) By Mouth Every 12 Hours   [employer] Fiber Gummies 2 g Chew Chew by mouth daily.   diazepam 5 MG tablet Commonly known as: VALIUM Take by mouth at bedtime as needed.   dicyclomine 10 MG capsule Commonly known as: BENTYL TAKE 1 CAPSULE BY MOUTH 3 TIMES A DAY BEFORE MEALS   dronabinol 5 MG capsule Commonly known as: MARINOL Take 1 capsule (5 mg total) by mouth 2 (two) times daily before a meal.   gabapentin 300 MG capsule Commonly known as: NEURONTIN TAKE 1 CAPSULE BY MOUTH THREE TIMES A DAY What changed:  how much to take how to take this  when to take this additional instructions   glimepiride 2 MG tablet Commonly known as: AMARYL Take 2 mg by mouth daily.   hydrocortisone 2.5 % cream Apply topically 2 (two) times daily.   lactulose 10 GM/15ML solution Commonly known as: CHRONULAC Take 30 mLs (20 g total) by mouth daily.   latanoprost 0.005 % ophthalmic solution Commonly known as: XALATAN Place 1 drop into both eyes at bedtime.   linaclotide 145 MCG Caps capsule Commonly known as: LINZESS Take by mouth daily as needed.   losartan 50 MG tablet Commonly known as: COZAAR Take 50 mg by mouth daily.   Lumigan 0.01 % Soln Generic drug: bimatoprost Place 1 drop into both eyes 3  (three) times daily.   mesalamine 1.2 g EC tablet Commonly known as: LIALDA Take by mouth daily with breakfast.   metoprolol succinate 25 MG 24 hr tablet Commonly known as: TOPROL-XL Take 25 mg by mouth daily.   mirabegron ER 25 MG Tb24 tablet Commonly known as: MYRBETRIQ Take 20 mg by mouth daily.   morphine 30 MG 12 hr tablet Commonly known as: MS CONTIN Take 30 mg by mouth 2 (two) times daily.   omeprazole 40 MG capsule Commonly known as: PRILOSEC TAKE 1 CAPSULE BY MOUTH TWICE A DAY   oxyCODONE 5 MG immediate release tablet Commonly known as: Roxicodone 1 tab PO q6 hours prn pain   oxyCODONE-acetaminophen 10-325 MG tablet Commonly known as: PERCOCET Take 1 tablet by mouth every 6 (six) hours as needed for pain.   pioglitazone 30 MG tablet Commonly known as: ACTOS Take 30 mg by mouth daily.   polyethylene glycol powder 17 GM/SCOOP powder Commonly known as: GLYCOLAX/MIRALAX SMARTSIG:1 scoopful By Mouth Twice Daily PRN   Restasis 0.05 % ophthalmic emulsion Generic drug: cycloSPORINE Place 1 drop into both eyes daily.   SUMAtriptan 100 MG tablet Commonly known as: IMITREX Take 100 mg by mouth daily.   tadalafil 10 MG tablet Commonly known as: CIALIS Take by mouth.   topiramate 25 MG capsule Commonly known as: TOPAMAX Take 25 mg by mouth 2 (two) times daily.   topiramate 50 MG tablet Commonly known as: TOPAMAX Take 50 mg by mouth at bedtime.   torsemide 20 MG tablet Commonly known as: DEMADEX Take 20 mg by mouth daily.   Trulicity 5.63 SL/3.7DS Sopn Generic drug: Dulaglutide Inject 0.75 mg into the skin every Saturday.   Vitamin D (Ergocalciferol) 1.25 MG (50000 UNIT) Caps capsule Commonly known as: DRISDOL Take 50,000 Units by mouth every Saturday.        Allergies:  Allergies  Allergen Reactions   Iodinated Contrast Media Rash    Patient states he was instructed not to take IV contrast.  In 2008 he had an unknown reaction, and was told not  to take it again.  He was also told not to take it due to his kidneys.   Iodine Anxiety, Rash and Other (See Comments)    Didn't feel right "instructed not to take per MD--something with his port"     Past Medical History, Surgical history, Social history, and Family History were reviewed and updated.  Review of Systems: All other 10 point review of systems is negative.   Physical Exam:  weight is 168 lb (76.2 kg). His oral temperature is 98.9 F (37.2 C). His blood pressure is 145/70 (abnormal) and his pulse is 96. His respiration is 18 and oxygen saturation is 100%.   Wt Readings from Last 3 Encounters:  10/28/21 168  lb (76.2 kg)  09/24/21 169 lb 6.4 oz (76.8 kg)  08/27/21 174 lb 1.9 oz (79 kg)    Ocular: Sclerae unicteric, pupils equal, round and reactive to light Ear-nose-throat: Oropharynx clear, dentition fair Lymphatic: No cervical or supraclavicular adenopathy Lungs no rales or rhonchi, good excursion bilaterally Heart regular rate and rhythm, no murmur appreciated Abd soft, nontender, positive bowel sounds MSK no focal spinal tenderness, no joint edema Neuro: non-focal, well-oriented, appropriate affect Breasts: Deferred   Lab Results  Component Value Date   WBC 6.5 10/28/2021   HGB 10.3 (L) 10/28/2021   HCT 32.7 (L) 10/28/2021   MCV 78.8 (L) 10/28/2021   PLT 189 10/28/2021   Lab Results  Component Value Date   FERRITIN 1,395 (H) 09/24/2021   IRON 98 09/24/2021   TIBC 224 (L) 09/24/2021   UIBC 126 09/24/2021   IRONPCTSAT 44 (H) 09/24/2021   Lab Results  Component Value Date   RETICCTPCT 0.7 10/28/2021   RBC 4.15 (L) 10/28/2021   RBC 4.10 (L) 10/28/2021   RETICCTABS 27.5 03/21/2014   Lab Results  Component Value Date   KPAFRELGTCHN 53.1 (H) 09/24/2021   LAMBDASER 27.8 (H) 09/24/2021   KAPLAMBRATIO 1.91 (H) 09/24/2021   Lab Results  Component Value Date   IGGSERUM 1,012 09/24/2021   IGA 242 09/24/2021   IGMSERUM 48 09/24/2021   Lab Results   Component Value Date   TOTALPROTELP 6.7 09/24/2021   ALBUMINELP 3.6 09/24/2021   A1GS 0.2 09/24/2021   A2GS 0.9 09/24/2021   BETS 0.9 09/24/2021   BETA2SER 0.5 11/07/2014   GAMS 1.0 09/24/2021   MSPIKE Not Observed 09/24/2021   SPEI Comment 09/24/2021     Chemistry      Component Value Date/Time   NA 140 10/28/2021 0820   NA 143 02/22/2017 0744   K 3.7 10/28/2021 0820   K 4.0 02/22/2017 0744   CL 101 10/28/2021 0820   CL 106 02/22/2017 0744   CO2 27 10/28/2021 0820   CO2 27 02/22/2017 0744   BUN 31 (H) 10/28/2021 0820   BUN 26 (H) 02/22/2017 0744   CREATININE 2.44 (H) 10/28/2021 0820   CREATININE 2.3 (H) 02/22/2017 0744      Component Value Date/Time   CALCIUM 9.1 10/28/2021 0820   CALCIUM 9.3 02/22/2017 0744   ALKPHOS 81 10/28/2021 0820   ALKPHOS 80 02/22/2017 0744   AST 29 10/28/2021 0820   ALT 24 10/28/2021 0820   ALT 22 02/22/2017 0744   BILITOT 0.6 10/28/2021 0820       Impression and Plan: [name] is a very pleasant 71 yo African American gentleman with IgG kappa myeloma.  We will proceed with treatment today.  Protein studies pending.  Follow-up in 1 month.   Lottie Dawson, NP 9/5/20239:40 AM

## 2021-10-28 NOTE — Progress Notes (Signed)
Changed each cycle's dexamethasone dose to 10 mg and Kyprolis dose basis to 30 mg/m2 per Ennever's instructions. Okay to treat with today's labs.

## 2021-10-29 ENCOUNTER — Telehealth: Payer: Self-pay | Admitting: *Deleted

## 2021-10-29 LAB — KAPPA/LAMBDA LIGHT CHAINS
Kappa free light chain: 47.3 mg/L — ABNORMAL HIGH (ref 3.3–19.4)
Kappa, lambda light chain ratio: 1.63 (ref 0.26–1.65)
Lambda free light chains: 29 mg/L — ABNORMAL HIGH (ref 5.7–26.3)

## 2021-10-29 LAB — IGG, IGA, IGM
IgA: 253 mg/dL (ref 61–437)
IgG (Immunoglobin G), Serum: 1030 mg/dL (ref 603–1613)
IgM (Immunoglobulin M), Srm: 51 mg/dL (ref 15–143)

## 2021-10-29 LAB — TESTOSTERONE: Testosterone: 301 ng/dL (ref 264–916)

## 2021-10-29 NOTE — Telephone Encounter (Signed)
Per 10/28/21 los - called and lvm of upcoming appointments - requested callback to confirm

## 2021-11-05 LAB — PROTEIN ELECTROPHORESIS, SERUM
A/G Ratio: 1.3 (ref 0.7–1.7)
Albumin ELP: 3.9 g/dL (ref 2.9–4.4)
Alpha-1-Globulin: 0.2 g/dL (ref 0.0–0.4)
Alpha-2-Globulin: 1 g/dL (ref 0.4–1.0)
Beta Globulin: 0.9 g/dL (ref 0.7–1.3)
Gamma Globulin: 1 g/dL (ref 0.4–1.8)
Globulin, Total: 3.1 g/dL (ref 2.2–3.9)
M-Spike, %: 0.2 g/dL — ABNORMAL HIGH
Total Protein ELP: 7 g/dL (ref 6.0–8.5)

## 2021-11-18 ENCOUNTER — Inpatient Hospital Stay: Payer: 59

## 2021-11-18 ENCOUNTER — Ambulatory Visit: Payer: 59

## 2021-11-26 ENCOUNTER — Inpatient Hospital Stay (HOSPITAL_BASED_OUTPATIENT_CLINIC_OR_DEPARTMENT_OTHER): Payer: 59 | Admitting: Family

## 2021-11-26 ENCOUNTER — Telehealth: Payer: Self-pay | Admitting: *Deleted

## 2021-11-26 ENCOUNTER — Inpatient Hospital Stay: Payer: 59 | Attending: Hematology & Oncology

## 2021-11-26 ENCOUNTER — Inpatient Hospital Stay: Payer: 59

## 2021-11-26 ENCOUNTER — Encounter: Payer: Self-pay | Admitting: Family

## 2021-11-26 VITALS — BP 149/72 | HR 79

## 2021-11-26 VITALS — BP 155/65 | HR 73 | Temp 98.3°F | Resp 18 | Wt 164.1 lb

## 2021-11-26 DIAGNOSIS — C9000 Multiple myeloma not having achieved remission: Secondary | ICD-10-CM

## 2021-11-26 DIAGNOSIS — Z5111 Encounter for antineoplastic chemotherapy: Secondary | ICD-10-CM | POA: Diagnosis present

## 2021-11-26 DIAGNOSIS — N189 Chronic kidney disease, unspecified: Secondary | ICD-10-CM | POA: Diagnosis not present

## 2021-11-26 DIAGNOSIS — Z5112 Encounter for antineoplastic immunotherapy: Secondary | ICD-10-CM | POA: Diagnosis present

## 2021-11-26 DIAGNOSIS — D631 Anemia in chronic kidney disease: Secondary | ICD-10-CM | POA: Diagnosis not present

## 2021-11-26 DIAGNOSIS — Z299 Encounter for prophylactic measures, unspecified: Secondary | ICD-10-CM

## 2021-11-26 DIAGNOSIS — D500 Iron deficiency anemia secondary to blood loss (chronic): Secondary | ICD-10-CM | POA: Diagnosis not present

## 2021-11-26 DIAGNOSIS — N184 Chronic kidney disease, stage 4 (severe): Secondary | ICD-10-CM | POA: Insufficient documentation

## 2021-11-26 DIAGNOSIS — M4712 Other spondylosis with myelopathy, cervical region: Secondary | ICD-10-CM

## 2021-11-26 DIAGNOSIS — E291 Testicular hypofunction: Secondary | ICD-10-CM | POA: Diagnosis not present

## 2021-11-26 DIAGNOSIS — E349 Endocrine disorder, unspecified: Secondary | ICD-10-CM

## 2021-11-26 LAB — CMP (CANCER CENTER ONLY)
ALT: 17 U/L (ref 0–44)
AST: 20 U/L (ref 15–41)
Albumin: 4.1 g/dL (ref 3.5–5.0)
Alkaline Phosphatase: 103 U/L (ref 38–126)
Anion gap: 9 (ref 5–15)
BUN: 24 mg/dL — ABNORMAL HIGH (ref 8–23)
CO2: 29 mmol/L (ref 22–32)
Calcium: 9.4 mg/dL (ref 8.9–10.3)
Chloride: 102 mmol/L (ref 98–111)
Creatinine: 2.3 mg/dL — ABNORMAL HIGH (ref 0.61–1.24)
GFR, Estimated: 29 mL/min — ABNORMAL LOW (ref >60)
Glucose, Bld: 93 mg/dL (ref 70–99)
Potassium: 3.9 mmol/L (ref 3.5–5.1)
Sodium: 140 mmol/L (ref 135–145)
Total Bilirubin: 0.5 mg/dL (ref 0.3–1.2)
Total Protein: 7.3 g/dL (ref 6.5–8.1)

## 2021-11-26 LAB — RETICULOCYTES
Immature Retic Fract: 8.1 % (ref 2.3–15.9)
RBC.: 3.75 MIL/uL — ABNORMAL LOW (ref 4.22–5.81)
Retic Count, Absolute: 26.3 10*3/uL (ref 19.0–186.0)
Retic Ct Pct: 0.7 % (ref 0.4–3.1)

## 2021-11-26 LAB — CBC WITH DIFFERENTIAL (CANCER CENTER ONLY)
Abs Immature Granulocytes: 0.03 10*3/uL (ref 0.00–0.07)
Basophils Absolute: 0.1 10*3/uL (ref 0.0–0.1)
Basophils Relative: 2 %
Eosinophils Absolute: 0.3 10*3/uL (ref 0.0–0.5)
Eosinophils Relative: 5 %
HCT: 29.2 % — ABNORMAL LOW (ref 39.0–52.0)
Hemoglobin: 9.3 g/dL — ABNORMAL LOW (ref 13.0–17.0)
Immature Granulocytes: 1 %
Lymphocytes Relative: 17 %
Lymphs Abs: 0.9 10*3/uL (ref 0.7–4.0)
MCH: 25.1 pg — ABNORMAL LOW (ref 26.0–34.0)
MCHC: 31.8 g/dL (ref 30.0–36.0)
MCV: 78.9 fL — ABNORMAL LOW (ref 80.0–100.0)
Monocytes Absolute: 0.7 10*3/uL (ref 0.1–1.0)
Monocytes Relative: 14 %
Neutro Abs: 3 10*3/uL (ref 1.7–7.7)
Neutrophils Relative %: 61 %
Platelet Count: 182 10*3/uL (ref 150–400)
RBC: 3.7 MIL/uL — ABNORMAL LOW (ref 4.22–5.81)
RDW: 16.1 % — ABNORMAL HIGH (ref 11.5–15.5)
WBC Count: 4.9 10*3/uL (ref 4.0–10.5)
nRBC: 0 % (ref 0.0–0.2)

## 2021-11-26 LAB — IRON AND IRON BINDING CAPACITY (CC-WL,HP ONLY)
Iron: 86 ug/dL (ref 45–182)
Saturation Ratios: 40 % — ABNORMAL HIGH (ref 17.9–39.5)
TIBC: 217 ug/dL — ABNORMAL LOW (ref 250–450)
UIBC: 131 ug/dL (ref 117–376)

## 2021-11-26 LAB — LACTATE DEHYDROGENASE: LDH: 224 U/L — ABNORMAL HIGH (ref 98–192)

## 2021-11-26 LAB — FERRITIN: Ferritin: 1387 ng/mL — ABNORMAL HIGH (ref 24–336)

## 2021-11-26 MED ORDER — TESTOSTERONE CYPIONATE 200 MG/ML IM SOLN
400.0000 mg | Freq: Once | INTRAMUSCULAR | Status: AC
  Administered 2021-11-26: 400 mg via INTRAMUSCULAR
  Filled 2021-11-26: qty 2

## 2021-11-26 MED ORDER — HEPARIN SOD (PORK) LOCK FLUSH 100 UNIT/ML IV SOLN
500.0000 [IU] | Freq: Once | INTRAVENOUS | Status: AC | PRN
  Administered 2021-11-26: 500 [IU]

## 2021-11-26 MED ORDER — SODIUM CHLORIDE 0.9 % IV SOLN
60.0000 mg | Freq: Once | INTRAVENOUS | Status: AC
  Administered 2021-11-26: 60 mg via INTRAVENOUS
  Filled 2021-11-26: qty 10

## 2021-11-26 MED ORDER — HYDROMORPHONE HCL 1 MG/ML IJ SOLN
2.0000 mg | Freq: Once | INTRAMUSCULAR | Status: AC
  Administered 2021-11-26: 2 mg via INTRAVENOUS
  Filled 2021-11-26: qty 2

## 2021-11-26 MED ORDER — SODIUM CHLORIDE 0.9 % IV SOLN: 40.0000 mg | Freq: Once | INTRAVENOUS | Status: DC

## 2021-11-26 MED ORDER — PALONOSETRON HCL INJECTION 0.25 MG/5ML
0.2500 mg | Freq: Once | INTRAVENOUS | Status: AC
  Administered 2021-11-26: 0.25 mg via INTRAVENOUS
  Filled 2021-11-26: qty 5

## 2021-11-26 MED ORDER — SODIUM CHLORIDE 0.9 % IV SOLN: Freq: Once | INTRAVENOUS | Status: AC

## 2021-11-26 MED ORDER — CYANOCOBALAMIN 1000 MCG/ML IJ SOLN
1000.0000 ug | Freq: Once | INTRAMUSCULAR | Status: AC
  Administered 2021-11-26: 1000 ug via INTRAMUSCULAR
  Filled 2021-11-26: qty 1

## 2021-11-26 MED ORDER — SODIUM CHLORIDE 0.9 % IV SOLN
300.0000 mg/m2 | Freq: Once | INTRAVENOUS | Status: AC
  Administered 2021-11-26: 580 mg via INTRAVENOUS
  Filled 2021-11-26: qty 29

## 2021-11-26 MED ORDER — EPOETIN ALFA-EPBX 40000 UNIT/ML IJ SOLN
40000.0000 [IU] | Freq: Once | INTRAMUSCULAR | Status: AC
  Administered 2021-11-26: 40000 [IU] via SUBCUTANEOUS
  Filled 2021-11-26: qty 1

## 2021-11-26 MED ORDER — DEXTROSE 5 % IV SOLN
36.0000 mg/m2 | Freq: Once | INTRAVENOUS | Status: AC
  Administered 2021-11-26: 70 mg via INTRAVENOUS
  Filled 2021-11-26: qty 30

## 2021-11-26 MED ORDER — SODIUM CHLORIDE 0.9 % IV SOLN
10.0000 mg | Freq: Once | INTRAVENOUS | Status: AC
  Administered 2021-11-26: 10 mg via INTRAVENOUS
  Filled 2021-11-26: qty 10

## 2021-11-26 MED ORDER — SODIUM CHLORIDE 0.9% FLUSH
10.0000 mL | INTRAVENOUS | Status: DC | PRN
  Administered 2021-11-26: 10 mL

## 2021-11-26 NOTE — Progress Notes (Signed)
Hematology and Oncology Follow Up Visit  [name] [id number] 07-11-1950 71 y.o. 11/26/2021   Principle Diagnosis:  IgG kappa myeloma Anemia secondary to renal insufficiency Intermittent iron - deficiency anemia Hypotestosteronemia   Current Therapy:        Aredia 60 mg IV q  month -- Retacrit SQ as needed for hemoglobin less than 10 DepoTestosterone 400 mg q 4 weeks IV iron as indicated Kyprolis/Cytoxan - s/p cycle 33   Interim History:  [name] is here today for follow-up and treatment. He has generalized aches and pains and states that the changes in the weather and cold exacerbate his arthritis.  He notes fatigue.  Last month his M-spike was 0.2 g/dL, IgG level was 1,030 mg/dL and 4.73 mg/dL.  No fever, chills, n/v, cough, rash, dizziness, SOB, chest pain, palpitations, abdominal pain or changes in bowel or bladder habits.  Constipation comes and goes. He uses a stool softener as needed.  No blood loss noted. No bruising or petechiae.  No swelling in his extremities at this time.  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  No falls or syncope reported.  Appetite comes and goes. He is supplementing with Boost or Ensure every other day as needed. Hydration is good, weight is 164 lbs.    ECOG Performance Status: 1 - Symptomatic but completely ambulatory  Medications:  Allergies as of 11/26/2021       Reactions   Iodinated Contrast Media Rash   Patient states he was instructed not to take IV contrast.  In 2008 he had an unknown reaction, and was told not to take it again.  He was also told not to take it due to his kidneys.   Iodine Anxiety, Rash, Other (See Comments)   Didn't feel right "instructed not to take per MD--something with his port"        Medication List        Accurate as of November 26, 2021  8:49 AM. If you have any questions, ask your nurse or doctor.          amLODipine 10 MG tablet Commonly known as: NORVASC Take 10  mg by mouth daily.   ammonium lactate 12 % lotion Commonly known as: AmLactin Apply 1 application topically as needed for dry skin.   atorvastatin 40 MG tablet Commonly known as: LIPITOR Take 40 mg by mouth daily at 6 PM.   budesonide-formoterol 160-4.5 MCG/ACT inhaler Commonly known as: SYMBICORT SMARTSIG:2 Puff(s) By Mouth Twice Daily   Carafate 1 GM/10ML suspension Generic drug: sucralfate Take 1 g by mouth 4 (four) times daily.   carvedilol 25 MG tablet Commonly known as: COREG Take 25 mg by mouth 2 (two) times daily.   chlorpheniramine-HYDROcodone 10-8 MG/5ML Commonly known as: TUSSIONEX SMARTSIG:5 Milliliter(s) By Mouth Every 12 Hours   [employer] Fiber Gummies 2 g Chew Chew by mouth daily.   diazepam 5 MG tablet Commonly known as: VALIUM Take by mouth at bedtime as needed.   dicyclomine 10 MG capsule Commonly known as: BENTYL TAKE 1 CAPSULE BY MOUTH 3 TIMES A DAY BEFORE MEALS   dronabinol 5 MG capsule Commonly known as: MARINOL Take 1 capsule (5 mg total) by mouth 2 (two) times daily before a meal.   gabapentin 300 MG capsule Commonly known as: NEURONTIN TAKE 1 CAPSULE BY MOUTH THREE TIMES A DAY What changed:  how much to take how to take this when to take this additional instructions   glimepiride 2 MG tablet  Commonly known as: AMARYL Take 2 mg by mouth daily.   hydrocortisone 2.5 % cream Apply topically 2 (two) times daily.   lactulose 10 GM/15ML solution Commonly known as: CHRONULAC Take 30 mLs (20 g total) by mouth daily.   latanoprost 0.005 % ophthalmic solution Commonly known as: XALATAN Place 1 drop into both eyes at bedtime.   linaclotide 145 MCG Caps capsule Commonly known as: LINZESS Take by mouth daily as needed.   losartan 50 MG tablet Commonly known as: COZAAR Take 50 mg by mouth daily.   Lumigan 0.01 % Soln Generic drug: bimatoprost Place 1 drop into both eyes 3 (three) times daily.   mesalamine 1.2 g EC tablet Commonly known  as: LIALDA Take by mouth daily with breakfast.   metoprolol succinate 25 MG 24 hr tablet Commonly known as: TOPROL-XL Take 25 mg by mouth daily.   mirabegron ER 25 MG Tb24 tablet Commonly known as: MYRBETRIQ Take 20 mg by mouth daily.   morphine 30 MG 12 hr tablet Commonly known as: MS CONTIN Take 30 mg by mouth 2 (two) times daily.   omeprazole 40 MG capsule Commonly known as: PRILOSEC TAKE 1 CAPSULE BY MOUTH TWICE A DAY   oxyCODONE 5 MG immediate release tablet Commonly known as: Roxicodone 1 tab PO q6 hours prn pain   oxyCODONE-acetaminophen 10-325 MG tablet Commonly known as: PERCOCET Take 1 tablet by mouth every 6 (six) hours as needed for pain.   pioglitazone 30 MG tablet Commonly known as: ACTOS Take 30 mg by mouth daily.   polyethylene glycol powder 17 GM/SCOOP powder Commonly known as: GLYCOLAX/MIRALAX SMARTSIG:1 scoopful By Mouth Twice Daily PRN   Restasis 0.05 % ophthalmic emulsion Generic drug: cycloSPORINE Place 1 drop into both eyes daily.   SUMAtriptan 100 MG tablet Commonly known as: IMITREX Take 100 mg by mouth daily.   tadalafil 10 MG tablet Commonly known as: CIALIS Take by mouth.   topiramate 25 MG capsule Commonly known as: TOPAMAX Take 25 mg by mouth 2 (two) times daily.   topiramate 50 MG tablet Commonly known as: TOPAMAX Take 50 mg by mouth at bedtime.   torsemide 20 MG tablet Commonly known as: DEMADEX Take 20 mg by mouth daily.   Trulicity 7.12 WP/8.0DX Sopn Generic drug: Dulaglutide Inject 0.75 mg into the skin every Saturday.   Vitamin D (Ergocalciferol) 1.25 MG (50000 UNIT) Caps capsule Commonly known as: DRISDOL Take 50,000 Units by mouth every Saturday.        Allergies:  Allergies  Allergen Reactions   Iodinated Contrast Media Rash    Patient states he was instructed not to take IV contrast.  In 2008 he had an unknown reaction, and was told not to take it again.  He was also told not to take it due to his  kidneys.   Iodine Anxiety, Rash and Other (See Comments)    Didn't feel right "instructed not to take per MD--something with his port"     Past Medical History, Surgical history, Social history, and Family History were reviewed and updated.  Review of Systems: All other 10 point review of systems is negative.   Physical Exam:  weight is 164 lb 1.6 oz (74.4 kg). His oral temperature is 98.3 F (36.8 C). His blood pressure is 155/65 (abnormal) and his pulse is 73. His respiration is 18 and oxygen saturation is 100%.   Wt Readings from Last 3 Encounters:  11/26/21 164 lb 1.6 oz (74.4 kg)  10/28/21 168 lb (76.2  kg)  09/24/21 169 lb 6.4 oz (76.8 kg)    Ocular: Sclerae unicteric, pupils equal, round and reactive to light Ear-nose-throat: Oropharynx clear, dentition fair Lymphatic: No cervical or supraclavicular adenopathy Lungs no rales or rhonchi, good excursion bilaterally Heart regular rate and rhythm, no murmur appreciated Abd soft, nontender, positive bowel sounds MSK no focal spinal tenderness, no joint edema Neuro: non-focal, well-oriented, appropriate affect Breasts: Deferred   Lab Results  Component Value Date   WBC 4.9 11/26/2021   HGB 9.3 (L) 11/26/2021   HCT 29.2 (L) 11/26/2021   MCV 78.9 (L) 11/26/2021   PLT 182 11/26/2021   Lab Results  Component Value Date   FERRITIN 1,284 (H) 10/28/2021   IRON 55 10/28/2021   TIBC 221 (L) 10/28/2021   UIBC 166 10/28/2021   IRONPCTSAT 25 10/28/2021   Lab Results  Component Value Date   RETICCTPCT 0.7 11/26/2021   RBC 3.70 (L) 11/26/2021   RBC 3.75 (L) 11/26/2021   RETICCTABS 27.5 03/21/2014   Lab Results  Component Value Date   KPAFRELGTCHN 47.3 (H) 10/28/2021   LAMBDASER 29.0 (H) 10/28/2021   KAPLAMBRATIO 1.63 10/28/2021   Lab Results  Component Value Date   IGGSERUM 1,030 10/28/2021   IGA 253 10/28/2021   IGMSERUM 51 10/28/2021   Lab Results  Component Value Date   TOTALPROTELP 7.0 10/28/2021    ALBUMINELP 3.9 10/28/2021   A1GS 0.2 10/28/2021   A2GS 1.0 10/28/2021   BETS 0.9 10/28/2021   BETA2SER 0.5 11/07/2014   GAMS 1.0 10/28/2021   MSPIKE 0.2 (H) 10/28/2021   SPEI Comment 10/28/2021     Chemistry      Component Value Date/Time   NA 140 11/26/2021 0805   NA 143 02/22/2017 0744   K 3.9 11/26/2021 0805   K 4.0 02/22/2017 0744   CL 102 11/26/2021 0805   CL 106 02/22/2017 0744   CO2 29 11/26/2021 0805   CO2 27 02/22/2017 0744   BUN 24 (H) 11/26/2021 0805   BUN 26 (H) 02/22/2017 0744   CREATININE 2.30 (H) 11/26/2021 0805   CREATININE 2.3 (H) 02/22/2017 0744      Component Value Date/Time   CALCIUM 9.4 11/26/2021 0805   CALCIUM 9.3 02/22/2017 0744   ALKPHOS 103 11/26/2021 0805   ALKPHOS 80 02/22/2017 0744   AST 20 11/26/2021 0805   ALT 17 11/26/2021 0805   ALT 22 02/22/2017 0744   BILITOT 0.5 11/26/2021 0805       Impression and Plan: [name] is a very pleasant 71 yo African American gentleman with IgG kappa myeloma.  Protein studies are pending.  Iron studies pending.  We will proceed with treatment today as planned.  He also received his ESA injection for Hgb 9.3.  Follow-up in 1 month.   Lottie Dawson, NP 10/4/20238:49 AM

## 2021-11-26 NOTE — Patient Instructions (Signed)

## 2021-11-26 NOTE — Progress Notes (Signed)
Reviewed labs with Lottie Dawson NP.  Ok to treat with Creatinine of 2.3

## 2021-11-26 NOTE — Patient Instructions (Signed)
[facility]  Discharge Instructions: Thank you for choosing [facility] to provide your oncology and hematology care.   If you have a lab appointment with the Cancer Center, please go directly to the Cancer Center and check in at the registration area.  Wear comfortable clothing and clothing appropriate for easy access to any Portacath or PICC line.   We strive to give you quality time with your provider. You may need to reschedule your appointment if you arrive late (15 or more minutes).  Arriving late affects you and other patients whose appointments are after yours.  Also, if you miss three or more appointments without notifying the office, you may be dismissed from the clinic at the provider's discretion.      For prescription refill requests, have your pharmacy contact our office and allow 72 hours for refills to be completed.    Today you received the following chemotherapy and/or immunotherapy agents  Kyprolis, Cytoxan.      To help prevent nausea and vomiting after your treatment, we encourage you to take your nausea medication as directed.  BELOW ARE SYMPTOMS THAT SHOULD BE REPORTED IMMEDIATELY: *FEVER GREATER THAN 100.4 F (38 C) OR HIGHER *CHILLS OR SWEATING *NAUSEA AND VOMITING THAT IS NOT CONTROLLED WITH YOUR NAUSEA MEDICATION *UNUSUAL SHORTNESS OF BREATH *UNUSUAL BRUISING OR BLEEDING *URINARY PROBLEMS (pain or burning when urinating, or frequent urination) *BOWEL PROBLEMS (unusual diarrhea, constipation, pain near the anus) TENDERNESS IN MOUTH AND THROAT WITH OR WITHOUT PRESENCE OF ULCERS (sore throat, sores in mouth, or a toothache) UNUSUAL RASH, SWELLING OR PAIN  UNUSUAL VAGINAL DISCHARGE OR ITCHING   Items with * indicate a potential emergency and should be followed up as soon as possible or go to the Emergency Department if any problems should occur.  Please show the CHEMOTHERAPY ALERT CARD or IMMUNOTHERAPY ALERT CARD at  check-in to the Emergency Department and triage nurse. Should you have questions after your visit or need to cancel or reschedule your appointment, please contact [facility]  [phone] and follow the prompts.  Office hours are 8:00 a.m. to 4:30 p.m. Monday - Friday. Please note that voicemails left after 4:00 p.m. may not be returned until the following business day.  We are closed weekends and major holidays. You have access to a nurse at all times for urgent questions. Please call the main number to the clinic [phone] and follow the prompts.  For any non-urgent questions, you may also contact your provider using MyChart. We now offer e-Visits for anyone 18 and older to request care online for non-urgent symptoms. For details visit [email or web address].   Also download the MyChart app! Go to the app store, search "MyChart", open the app, select [facility], and log in with your MyChart username and password.  Masks are optional in the cancer centers. If you would like for your care team to wear a mask while they are taking care of you, please let them know. You may have one support person who is at least 72 years old accompany you for your appointments. 

## 2021-11-26 NOTE — Telephone Encounter (Signed)
Per 11/26/21 los - gave upcoming appointments - confirmed

## 2021-11-27 ENCOUNTER — Other Ambulatory Visit: Payer: Self-pay

## 2021-11-27 LAB — KAPPA/LAMBDA LIGHT CHAINS
Kappa free light chain: 57.2 mg/L — ABNORMAL HIGH (ref 3.3–19.4)
Kappa, lambda light chain ratio: 1.82 — ABNORMAL HIGH (ref 0.26–1.65)
Lambda free light chains: 31.5 mg/L — ABNORMAL HIGH (ref 5.7–26.3)

## 2021-11-28 ENCOUNTER — Other Ambulatory Visit: Payer: Self-pay

## 2021-11-28 LAB — IGG, IGA, IGM
IgA: 276 mg/dL (ref 61–437)
IgG (Immunoglobin G), Serum: 1117 mg/dL (ref 603–1613)
IgM (Immunoglobulin M), Srm: 51 mg/dL (ref 15–143)

## 2021-12-01 LAB — PROTEIN ELECTROPHORESIS, SERUM
A/G Ratio: 1.1 (ref 0.7–1.7)
Albumin ELP: 3.6 g/dL (ref 2.9–4.4)
Alpha-1-Globulin: 0.2 g/dL (ref 0.0–0.4)
Alpha-2-Globulin: 1 g/dL (ref 0.4–1.0)
Beta Globulin: 0.9 g/dL (ref 0.7–1.3)
Gamma Globulin: 1.2 g/dL (ref 0.4–1.8)
Globulin, Total: 3.2 g/dL (ref 2.2–3.9)
Total Protein ELP: 6.8 g/dL (ref 6.0–8.5)

## 2021-12-19 ENCOUNTER — Other Ambulatory Visit: Payer: Self-pay

## 2021-12-26 ENCOUNTER — Encounter: Payer: Self-pay | Admitting: Podiatry

## 2021-12-26 ENCOUNTER — Ambulatory Visit (INDEPENDENT_AMBULATORY_CARE_PROVIDER_SITE_OTHER): Payer: 59 | Admitting: Podiatry

## 2021-12-26 DIAGNOSIS — M79675 Pain in left toe(s): Secondary | ICD-10-CM | POA: Diagnosis not present

## 2021-12-26 DIAGNOSIS — M79674 Pain in right toe(s): Secondary | ICD-10-CM

## 2021-12-26 DIAGNOSIS — B351 Tinea unguium: Secondary | ICD-10-CM

## 2021-12-26 DIAGNOSIS — E1151 Type 2 diabetes mellitus with diabetic peripheral angiopathy without gangrene: Secondary | ICD-10-CM | POA: Diagnosis not present

## 2021-12-26 DIAGNOSIS — M2010 Hallux valgus (acquired), unspecified foot: Secondary | ICD-10-CM

## 2021-12-26 NOTE — Progress Notes (Signed)
This patient returns to my office for at risk foot care.  This patient requires this care by a professional since this patient will be at risk due to having diabetes type 2.  This patient is unable to cut nails himself since the patient cannot reach his nails.These nails are painful walking and wearing shoes.  Painful callus under outside ball of both feet.This patient presents for at risk foot care today.  General Appearance  Alert, conversant and in no acute stress.  Vascular  Dorsalis pedis and posterior tibial  pulses are weakly  palpable  bilaterally.  Capillary return is within normal limits  bilaterally. Cold feet bilaterally.  Absent digital hair.  Neurologic  Senn-Weinstein monofilament wire test within normal limits/diminished right.  LOPS absent left foot. Muscle power within normal limits bilaterally.  Nails Thick disfigured discolored nails with subungual debris  from hallux to fifth toes bilaterally. No evidence of bacterial infection or drainage bilaterally.  Orthopedic  No limitations of motion  feet .  No crepitus or effusions noted.  No bony pathology or digital deformities noted.  HAV  B/L.  Exostosis right foot.Plantar flexed fifth metatarsall B/l.  Skin  normotropic skin with no porokeratosis noted bilaterally.  No signs of infections or ulcers noted.  Callus sub 5th met  B/l. No infection.  Onychomycosis  Pain in right toes  Pain in left toes  Porokeratosis  B/L  Consent was obtained for treatment procedures.   Mechanical debridement of nails 1-5  bilaterally performed with a nail nipper.  Filed with dremel without incident. Debride callus sub 5th  B/L with # 15 blade.   Return office visit    10 weeks                  Told patient to return for periodic foot care and evaluation due to potential at risk complications.   Gardiner Barefoot DPM

## 2021-12-27 ENCOUNTER — Other Ambulatory Visit: Payer: Self-pay

## 2021-12-29 ENCOUNTER — Other Ambulatory Visit (HOSPITAL_BASED_OUTPATIENT_CLINIC_OR_DEPARTMENT_OTHER): Payer: Self-pay

## 2021-12-29 ENCOUNTER — Inpatient Hospital Stay (HOSPITAL_BASED_OUTPATIENT_CLINIC_OR_DEPARTMENT_OTHER): Payer: 59 | Admitting: Family

## 2021-12-29 ENCOUNTER — Other Ambulatory Visit: Payer: Self-pay

## 2021-12-29 ENCOUNTER — Other Ambulatory Visit: Payer: Self-pay | Admitting: *Deleted

## 2021-12-29 ENCOUNTER — Encounter: Payer: Self-pay | Admitting: Family

## 2021-12-29 ENCOUNTER — Inpatient Hospital Stay: Payer: 59

## 2021-12-29 ENCOUNTER — Inpatient Hospital Stay: Payer: 59 | Attending: Hematology & Oncology

## 2021-12-29 VITALS — BP 139/63

## 2021-12-29 VITALS — BP 161/68 | HR 82 | Temp 98.2°F | Resp 18 | Ht 71.0 in | Wt 165.0 lb

## 2021-12-29 DIAGNOSIS — R1030 Lower abdominal pain, unspecified: Secondary | ICD-10-CM

## 2021-12-29 DIAGNOSIS — R1084 Generalized abdominal pain: Secondary | ICD-10-CM

## 2021-12-29 DIAGNOSIS — N189 Chronic kidney disease, unspecified: Secondary | ICD-10-CM | POA: Insufficient documentation

## 2021-12-29 DIAGNOSIS — C9000 Multiple myeloma not having achieved remission: Secondary | ICD-10-CM

## 2021-12-29 DIAGNOSIS — M2550 Pain in unspecified joint: Secondary | ICD-10-CM

## 2021-12-29 DIAGNOSIS — E1151 Type 2 diabetes mellitus with diabetic peripheral angiopathy without gangrene: Secondary | ICD-10-CM

## 2021-12-29 DIAGNOSIS — I10 Essential (primary) hypertension: Secondary | ICD-10-CM

## 2021-12-29 DIAGNOSIS — B351 Tinea unguium: Secondary | ICD-10-CM

## 2021-12-29 DIAGNOSIS — Z5111 Encounter for antineoplastic chemotherapy: Secondary | ICD-10-CM | POA: Diagnosis present

## 2021-12-29 DIAGNOSIS — R109 Unspecified abdominal pain: Secondary | ICD-10-CM

## 2021-12-29 DIAGNOSIS — M4712 Other spondylosis with myelopathy, cervical region: Secondary | ICD-10-CM

## 2021-12-29 DIAGNOSIS — Z299 Encounter for prophylactic measures, unspecified: Secondary | ICD-10-CM

## 2021-12-29 DIAGNOSIS — R112 Nausea with vomiting, unspecified: Secondary | ICD-10-CM

## 2021-12-29 DIAGNOSIS — D500 Iron deficiency anemia secondary to blood loss (chronic): Secondary | ICD-10-CM

## 2021-12-29 DIAGNOSIS — K625 Hemorrhage of anus and rectum: Secondary | ICD-10-CM

## 2021-12-29 DIAGNOSIS — W44F3XA Food entering into or through a natural orifice, initial encounter: Secondary | ICD-10-CM

## 2021-12-29 DIAGNOSIS — K224 Dyskinesia of esophagus: Secondary | ICD-10-CM

## 2021-12-29 DIAGNOSIS — K317 Polyp of stomach and duodenum: Secondary | ICD-10-CM

## 2021-12-29 DIAGNOSIS — D631 Anemia in chronic kidney disease: Secondary | ICD-10-CM | POA: Diagnosis not present

## 2021-12-29 DIAGNOSIS — L84 Corns and callosities: Secondary | ICD-10-CM

## 2021-12-29 DIAGNOSIS — Z5112 Encounter for antineoplastic immunotherapy: Secondary | ICD-10-CM | POA: Insufficient documentation

## 2021-12-29 DIAGNOSIS — K5900 Constipation, unspecified: Secondary | ICD-10-CM

## 2021-12-29 DIAGNOSIS — K921 Melena: Secondary | ICD-10-CM

## 2021-12-29 DIAGNOSIS — E349 Endocrine disorder, unspecified: Secondary | ICD-10-CM

## 2021-12-29 DIAGNOSIS — D2262 Melanocytic nevi of left upper limb, including shoulder: Secondary | ICD-10-CM

## 2021-12-29 DIAGNOSIS — L720 Epidermal cyst: Secondary | ICD-10-CM

## 2021-12-29 DIAGNOSIS — M2010 Hallux valgus (acquired), unspecified foot: Secondary | ICD-10-CM

## 2021-12-29 DIAGNOSIS — E231 Drug-induced hypopituitarism: Secondary | ICD-10-CM | POA: Insufficient documentation

## 2021-12-29 DIAGNOSIS — R1319 Other dysphagia: Secondary | ICD-10-CM

## 2021-12-29 LAB — CMP (CANCER CENTER ONLY)
ALT: 16 U/L (ref 0–44)
AST: 22 U/L (ref 15–41)
Albumin: 4.2 g/dL (ref 3.5–5.0)
Alkaline Phosphatase: 76 U/L (ref 38–126)
Anion gap: 9 (ref 5–15)
BUN: 30 mg/dL — ABNORMAL HIGH (ref 8–23)
CO2: 28 mmol/L (ref 22–32)
Calcium: 8.9 mg/dL (ref 8.9–10.3)
Chloride: 103 mmol/L (ref 98–111)
Creatinine: 2.21 mg/dL — ABNORMAL HIGH (ref 0.61–1.24)
GFR, Estimated: 31 mL/min — ABNORMAL LOW (ref >60)
Glucose, Bld: 147 mg/dL — ABNORMAL HIGH (ref 70–99)
Potassium: 3.6 mmol/L (ref 3.5–5.1)
Sodium: 140 mmol/L (ref 135–145)
Total Bilirubin: 0.4 mg/dL (ref 0.3–1.2)
Total Protein: 7.3 g/dL (ref 6.5–8.1)

## 2021-12-29 LAB — CBC WITH DIFFERENTIAL (CANCER CENTER ONLY)
Abs Immature Granulocytes: 0.03 10*3/uL (ref 0.00–0.07)
Basophils Absolute: 0.1 10*3/uL (ref 0.0–0.1)
Basophils Relative: 1 %
Eosinophils Absolute: 0.2 10*3/uL (ref 0.0–0.5)
Eosinophils Relative: 3 %
HCT: 32.9 % — ABNORMAL LOW (ref 39.0–52.0)
Hemoglobin: 10.2 g/dL — ABNORMAL LOW (ref 13.0–17.0)
Immature Granulocytes: 1 %
Lymphocytes Relative: 15 %
Lymphs Abs: 0.7 10*3/uL (ref 0.7–4.0)
MCH: 25.1 pg — ABNORMAL LOW (ref 26.0–34.0)
MCHC: 31 g/dL (ref 30.0–36.0)
MCV: 80.8 fL (ref 80.0–100.0)
Monocytes Absolute: 0.6 10*3/uL (ref 0.1–1.0)
Monocytes Relative: 12 %
Neutro Abs: 3.1 10*3/uL (ref 1.7–7.7)
Neutrophils Relative %: 68 %
Platelet Count: 155 10*3/uL (ref 150–400)
RBC: 4.07 MIL/uL — ABNORMAL LOW (ref 4.22–5.81)
RDW: 16.7 % — ABNORMAL HIGH (ref 11.5–15.5)
WBC Count: 4.5 10*3/uL (ref 4.0–10.5)
nRBC: 0 % (ref 0.0–0.2)

## 2021-12-29 LAB — IRON AND IRON BINDING CAPACITY (CC-WL,HP ONLY)
Iron: 101 ug/dL (ref 45–182)
Saturation Ratios: 45 % — ABNORMAL HIGH (ref 17.9–39.5)
TIBC: 224 ug/dL — ABNORMAL LOW (ref 250–450)
UIBC: 123 ug/dL (ref 117–376)

## 2021-12-29 LAB — RETICULOCYTES
Immature Retic Fract: 6.5 % (ref 2.3–15.9)
RBC.: 4.03 MIL/uL — ABNORMAL LOW (ref 4.22–5.81)
Retic Count, Absolute: 16.9 10*3/uL — ABNORMAL LOW (ref 19.0–186.0)
Retic Ct Pct: 0.4 % (ref 0.4–3.1)

## 2021-12-29 LAB — LACTATE DEHYDROGENASE: LDH: 217 U/L — ABNORMAL HIGH (ref 98–192)

## 2021-12-29 LAB — FERRITIN: Ferritin: 1310 ng/mL — ABNORMAL HIGH (ref 24–336)

## 2021-12-29 MED ORDER — SODIUM CHLORIDE 0.9 % IV SOLN
300.0000 mg/m2 | Freq: Once | INTRAVENOUS | Status: AC
  Administered 2021-12-29: 580 mg via INTRAVENOUS
  Filled 2021-12-29: qty 29

## 2021-12-29 MED ORDER — TESTOSTERONE CYPIONATE 200 MG/ML IM SOLN
400.0000 mg | Freq: Once | INTRAMUSCULAR | Status: AC
  Administered 2021-12-29: 400 mg via INTRAMUSCULAR
  Filled 2021-12-29: qty 2

## 2021-12-29 MED ORDER — SODIUM CHLORIDE 0.9 % IV SOLN: Freq: Once | INTRAVENOUS | Status: AC

## 2021-12-29 MED ORDER — PROCHLORPERAZINE MALEATE 10 MG PO TABS: ORAL_TABLET | ORAL | 1 refills | Status: DC

## 2021-12-29 MED ORDER — COMIRNATY 30 MCG/0.3ML IM SUSY
PREFILLED_SYRINGE | INTRAMUSCULAR | 0 refills | Status: DC
  Filled 2021-12-29: qty 0.3, 1d supply, fill #0

## 2021-12-29 MED ORDER — SODIUM CHLORIDE 0.9 % IV SOLN
10.0000 mg | Freq: Once | INTRAVENOUS | Status: AC
  Administered 2021-12-29: 10 mg via INTRAVENOUS
  Filled 2021-12-29: qty 10

## 2021-12-29 MED ORDER — HEPARIN SOD (PORK) LOCK FLUSH 100 UNIT/ML IV SOLN
500.0000 [IU] | Freq: Once | INTRAVENOUS | Status: AC | PRN
  Administered 2021-12-29: 500 [IU]

## 2021-12-29 MED ORDER — SODIUM CHLORIDE 0.9% FLUSH
10.0000 mL | INTRAVENOUS | Status: DC | PRN
  Administered 2021-12-29: 10 mL

## 2021-12-29 MED ORDER — PALONOSETRON HCL INJECTION 0.25 MG/5ML
0.2500 mg | Freq: Once | INTRAVENOUS | Status: AC
  Administered 2021-12-29: 0.25 mg via INTRAVENOUS
  Filled 2021-12-29: qty 5

## 2021-12-29 MED ORDER — SODIUM CHLORIDE 0.9 % IV SOLN
60.0000 mg | Freq: Once | INTRAVENOUS | Status: AC
  Administered 2021-12-29: 60 mg via INTRAVENOUS
  Filled 2021-12-29: qty 10
  Filled 2021-12-29: qty 20

## 2021-12-29 MED ORDER — CYANOCOBALAMIN 1000 MCG/ML IJ SOLN
1000.0000 ug | Freq: Once | INTRAMUSCULAR | Status: AC
  Administered 2021-12-29: 1000 ug via INTRAMUSCULAR
  Filled 2021-12-29: qty 1

## 2021-12-29 MED ORDER — DEXTROSE 5 % IV SOLN
60.0000 mg | Freq: Once | INTRAVENOUS | Status: AC
  Administered 2021-12-29: 60 mg via INTRAVENOUS
  Filled 2021-12-29: qty 30

## 2021-12-29 MED ORDER — HYDROMORPHONE HCL 1 MG/ML IJ SOLN
2.0000 mg | Freq: Once | INTRAMUSCULAR | Status: AC
  Administered 2021-12-29: 2 mg via INTRAVENOUS
  Filled 2021-12-29: qty 2

## 2021-12-29 NOTE — Addendum Note (Signed)
Addended by: Burney Gauze R on: 12/29/2021 09:09 AM   Modules accepted: Orders

## 2021-12-29 NOTE — Patient Instructions (Signed)

## 2021-12-29 NOTE — Progress Notes (Signed)
OK to treat with creat-2.21 per order of Dr. [name].

## 2021-12-29 NOTE — Progress Notes (Signed)
Keep Kyprolis dose at '60mg'$  and give dexamethasone 10 mg. Orders changed per Dr. Antonieta Pert instructions.

## 2021-12-29 NOTE — Progress Notes (Signed)
Hematology and Oncology Follow Up Visit  [name] [id number] 06/12/1950 71 y.o. 12/29/2021   Principle Diagnosis:  IgG kappa myeloma Anemia secondary to renal insufficiency Intermittent iron - deficiency anemia Hypotestosteronemia   Current Therapy:        Aredia 60 mg IV q  month  Retacrit SQ as needed for hemoglobin less than 10 DepoTestosterone 400 mg q 4 weeks IV iron as indicated Kyprolis/Cytoxan - s/p cycle 33   Interim History:  [name] is here today for follow-up and treatment. He is doing fairly well. He notes that the recent cold weather changes seem to have exacerbated his arthritis and generalized aches and pains.  M-spike last month was not observed, IgG level 1,117 mg/dL and kappa light chains 5.72 mg/dL.  He notes fatigue.  No fever, n/v, cough, rash, dizziness, SOB, chest pain, palpitations, abdominal pain or changes in bowel or bladder habits.  No swelling in his extremities at this time. Pedal pulses are 2+.  Neuropathy in hands and feet comes and goes.  No falls or syncope to report.  Appetite and hydration are good. Weight is stable at 165 lbs.   ECOG Performance Status: 1 - Symptomatic but completely ambulatory  Medications:  Allergies as of 12/29/2021       Reactions   Iodinated Contrast Media Rash   Patient states he was instructed not to take IV contrast.  In 2008 he had an unknown reaction, and was told not to take it again.  He was also told not to take it due to his kidneys.   Iodine Anxiety, Rash, Other (See Comments)   Didn't feel right "instructed not to take per MD--something with his port"        Medication List        Accurate as of December 29, 2021  8:45 AM. If you have any questions, ask your nurse or doctor.          amLODipine 10 MG tablet Commonly known as: NORVASC Take 10 mg by mouth daily.   ammonium lactate 12 % lotion Commonly known as: AmLactin Apply 1 application topically as needed for dry skin.    atorvastatin 40 MG tablet Commonly known as: LIPITOR Take 40 mg by mouth daily at 6 PM.   budesonide-formoterol 160-4.5 MCG/ACT inhaler Commonly known as: SYMBICORT SMARTSIG:2 Puff(s) By Mouth Twice Daily   Carafate 1 GM/10ML suspension Generic drug: sucralfate Take 1 g by mouth 4 (four) times daily.   carvedilol 25 MG tablet Commonly known as: COREG Take 25 mg by mouth 2 (two) times daily.   chlorpheniramine-HYDROcodone 10-8 MG/5ML Commonly known as: TUSSIONEX SMARTSIG:5 Milliliter(s) By Mouth Every 12 Hours   [employer] Fiber Gummies 2 g Chew Chew by mouth daily.   diazepam 5 MG tablet Commonly known as: VALIUM Take by mouth at bedtime as needed.   dicyclomine 10 MG capsule Commonly known as: BENTYL TAKE 1 CAPSULE BY MOUTH 3 TIMES A DAY BEFORE MEALS   dronabinol 5 MG capsule Commonly known as: MARINOL Take 1 capsule (5 mg total) by mouth 2 (two) times daily before a meal.   gabapentin 300 MG capsule Commonly known as: NEURONTIN TAKE 1 CAPSULE BY MOUTH THREE TIMES A DAY What changed:  how much to take how to take this when to take this additional instructions   glimepiride 2 MG tablet Commonly known as: AMARYL Take 2 mg by mouth daily.   hydrocortisone 2.5 % cream Apply topically 2 (two) times daily.   lactulose 10  GM/15ML solution Commonly known as: CHRONULAC Take 30 mLs (20 g total) by mouth daily.   latanoprost 0.005 % ophthalmic solution Commonly known as: XALATAN Place 1 drop into both eyes at bedtime.   linaclotide 145 MCG Caps capsule Commonly known as: LINZESS Take by mouth daily as needed.   losartan 50 MG tablet Commonly known as: COZAAR Take 50 mg by mouth daily.   Lumigan 0.01 % Soln Generic drug: bimatoprost Place 1 drop into both eyes 3 (three) times daily.   mesalamine 1.2 g EC tablet Commonly known as: LIALDA Take by mouth daily with breakfast.   metoprolol succinate 25 MG 24 hr tablet Commonly known as: TOPROL-XL Take 25 mg by  mouth daily.   mirabegron ER 25 MG Tb24 tablet Commonly known as: MYRBETRIQ Take 20 mg by mouth daily.   morphine 30 MG 12 hr tablet Commonly known as: MS CONTIN Take 30 mg by mouth 2 (two) times daily.   omeprazole 40 MG capsule Commonly known as: PRILOSEC TAKE 1 CAPSULE BY MOUTH TWICE A DAY   oxyCODONE 5 MG immediate release tablet Commonly known as: Roxicodone 1 tab PO q6 hours prn pain   oxyCODONE-acetaminophen 10-325 MG tablet Commonly known as: PERCOCET Take 1 tablet by mouth every 6 (six) hours as needed for pain.   pioglitazone 30 MG tablet Commonly known as: ACTOS Take 30 mg by mouth daily.   polyethylene glycol powder 17 GM/SCOOP powder Commonly known as: GLYCOLAX/MIRALAX SMARTSIG:1 scoopful By Mouth Twice Daily PRN   Restasis 0.05 % ophthalmic emulsion Generic drug: cycloSPORINE Place 1 drop into both eyes daily.   SUMAtriptan 100 MG tablet Commonly known as: IMITREX Take 100 mg by mouth daily.   tadalafil 10 MG tablet Commonly known as: CIALIS Take by mouth.   topiramate 25 MG capsule Commonly known as: TOPAMAX Take 25 mg by mouth 2 (two) times daily.   topiramate 50 MG tablet Commonly known as: TOPAMAX Take 50 mg by mouth at bedtime.   torsemide 20 MG tablet Commonly known as: DEMADEX Take 20 mg by mouth daily.   Trulicity 6.30 ZS/0.1UX Sopn Generic drug: Dulaglutide Inject 0.75 mg into the skin every Saturday.   Vitamin D (Ergocalciferol) 1.25 MG (50000 UNIT) Caps capsule Commonly known as: DRISDOL Take 50,000 Units by mouth every Saturday.        Allergies:  Allergies  Allergen Reactions   Iodinated Contrast Media Rash    Patient states he was instructed not to take IV contrast.  In 2008 he had an unknown reaction, and was told not to take it again.  He was also told not to take it due to his kidneys.   Iodine Anxiety, Rash and Other (See Comments)    Didn't feel right "instructed not to take per MD--something with his port"      Past Medical History, Surgical history, Social history, and Family History were reviewed and updated.  Review of Systems: All other 10 point review of systems is negative.   Physical Exam:  height is '5\' 11"'$  (1.803 m) and weight is 165 lb 0.6 oz (74.9 kg). His oral temperature is 98.2 F (36.8 C). His blood pressure is 161/68 (abnormal) and his pulse is 82. His respiration is 18 and oxygen saturation is 98%.   Wt Readings from Last 3 Encounters:  12/29/21 165 lb 0.6 oz (74.9 kg)  11/26/21 164 lb 1.6 oz (74.4 kg)  10/28/21 168 lb (76.2 kg)    Ocular: Sclerae unicteric, pupils equal, round and  reactive to light Ear-nose-throat: Oropharynx clear, dentition fair Lymphatic: No cervical or supraclavicular adenopathy Lungs no rales or rhonchi, good excursion bilaterally Heart regular rate and rhythm, no murmur appreciated Abd soft, nontender, positive bowel sounds MSK no focal spinal tenderness, no joint edema Neuro: non-focal, well-oriented, appropriate affect Breasts: Deferred   Lab Results  Component Value Date   WBC 4.5 12/29/2021   HGB 10.2 (L) 12/29/2021   HCT 32.9 (L) 12/29/2021   MCV 80.8 12/29/2021   PLT 155 12/29/2021   Lab Results  Component Value Date   FERRITIN 1,387 (H) 11/26/2021   IRON 86 11/26/2021   TIBC 217 (L) 11/26/2021   UIBC 131 11/26/2021   IRONPCTSAT 40 (H) 11/26/2021   Lab Results  Component Value Date   RETICCTPCT 0.4 12/29/2021   RBC 4.07 (L) 12/29/2021   RBC 4.03 (L) 12/29/2021   RETICCTABS 27.5 03/21/2014   Lab Results  Component Value Date   KPAFRELGTCHN 57.2 (H) 11/26/2021   LAMBDASER 31.5 (H) 11/26/2021   KAPLAMBRATIO 1.82 (H) 11/26/2021   Lab Results  Component Value Date   IGGSERUM 1,117 11/26/2021   IGA 276 11/26/2021   IGMSERUM 51 11/26/2021   Lab Results  Component Value Date   TOTALPROTELP 6.8 11/26/2021   ALBUMINELP 3.6 11/26/2021   A1GS 0.2 11/26/2021   A2GS 1.0 11/26/2021   BETS 0.9 11/26/2021   BETA2SER 0.5  11/07/2014   GAMS 1.2 11/26/2021   MSPIKE Not Observed 11/26/2021   SPEI Comment 11/26/2021     Chemistry      Component Value Date/Time   NA 140 11/26/2021 0805   NA 143 02/22/2017 0744   K 3.9 11/26/2021 0805   K 4.0 02/22/2017 0744   CL 102 11/26/2021 0805   CL 106 02/22/2017 0744   CO2 29 11/26/2021 0805   CO2 27 02/22/2017 0744   BUN 24 (H) 11/26/2021 0805   BUN 26 (H) 02/22/2017 0744   CREATININE 2.30 (H) 11/26/2021 0805   CREATININE 2.3 (H) 02/22/2017 0744      Component Value Date/Time   CALCIUM 9.4 11/26/2021 0805   CALCIUM 9.3 02/22/2017 0744   ALKPHOS 103 11/26/2021 0805   ALKPHOS 80 02/22/2017 0744   AST 20 11/26/2021 0805   ALT 17 11/26/2021 0805   ALT 22 02/22/2017 0744   BILITOT 0.5 11/26/2021 0805       Impression and Plan: [name] is a very pleasant 71 yo African American gentleman with IgG kappa myeloma.  Protein and iron studies are pending.  No ESA needed this visit, Hgb 10.2.  We will proceed with treatment today as planned.  Follow-up in 1 month.   Lottie Dawson, NP 11/6/20238:45 AM

## 2021-12-30 LAB — KAPPA/LAMBDA LIGHT CHAINS
Kappa free light chain: 50.2 mg/L — ABNORMAL HIGH (ref 3.3–19.4)
Kappa, lambda light chain ratio: 1.65 (ref 0.26–1.65)
Lambda free light chains: 30.4 mg/L — ABNORMAL HIGH (ref 5.7–26.3)

## 2021-12-30 LAB — IGG, IGA, IGM
IgA: 274 mg/dL (ref 61–437)
IgG (Immunoglobin G), Serum: 1125 mg/dL (ref 603–1613)
IgM (Immunoglobulin M), Srm: 57 mg/dL (ref 15–143)

## 2021-12-31 LAB — PROTEIN ELECTROPHORESIS, SERUM
A/G Ratio: 1.2 (ref 0.7–1.7)
Albumin ELP: 2.9 g/dL (ref 2.9–4.4)
Alpha-1-Globulin: 0.2 g/dL (ref 0.0–0.4)
Alpha-2-Globulin: 0.6 g/dL (ref 0.4–1.0)
Beta Globulin: 0.7 g/dL (ref 0.7–1.3)
Gamma Globulin: 0.9 g/dL (ref 0.4–1.8)
Globulin, Total: 2.4 g/dL (ref 2.2–3.9)
Total Protein ELP: 5.3 g/dL — ABNORMAL LOW (ref 6.0–8.5)

## 2022-01-29 ENCOUNTER — Inpatient Hospital Stay: Payer: 59

## 2022-01-29 ENCOUNTER — Encounter: Payer: Self-pay | Admitting: Hematology & Oncology

## 2022-01-29 ENCOUNTER — Other Ambulatory Visit: Payer: Self-pay | Admitting: *Deleted

## 2022-01-29 ENCOUNTER — Inpatient Hospital Stay: Payer: 59 | Attending: Hematology & Oncology

## 2022-01-29 ENCOUNTER — Other Ambulatory Visit: Payer: Self-pay

## 2022-01-29 ENCOUNTER — Inpatient Hospital Stay (HOSPITAL_BASED_OUTPATIENT_CLINIC_OR_DEPARTMENT_OTHER): Payer: 59 | Admitting: Hematology & Oncology

## 2022-01-29 ENCOUNTER — Telehealth: Payer: Self-pay | Admitting: *Deleted

## 2022-01-29 DIAGNOSIS — Z5112 Encounter for antineoplastic immunotherapy: Secondary | ICD-10-CM | POA: Insufficient documentation

## 2022-01-29 DIAGNOSIS — Z299 Encounter for prophylactic measures, unspecified: Secondary | ICD-10-CM

## 2022-01-29 DIAGNOSIS — D500 Iron deficiency anemia secondary to blood loss (chronic): Secondary | ICD-10-CM

## 2022-01-29 DIAGNOSIS — E291 Testicular hypofunction: Secondary | ICD-10-CM | POA: Diagnosis not present

## 2022-01-29 DIAGNOSIS — D631 Anemia in chronic kidney disease: Secondary | ICD-10-CM | POA: Insufficient documentation

## 2022-01-29 DIAGNOSIS — M4712 Other spondylosis with myelopathy, cervical region: Secondary | ICD-10-CM

## 2022-01-29 DIAGNOSIS — N184 Chronic kidney disease, stage 4 (severe): Secondary | ICD-10-CM | POA: Insufficient documentation

## 2022-01-29 DIAGNOSIS — Z5111 Encounter for antineoplastic chemotherapy: Secondary | ICD-10-CM | POA: Insufficient documentation

## 2022-01-29 DIAGNOSIS — C9000 Multiple myeloma not having achieved remission: Secondary | ICD-10-CM

## 2022-01-29 DIAGNOSIS — Z79890 Hormone replacement therapy (postmenopausal): Secondary | ICD-10-CM | POA: Diagnosis not present

## 2022-01-29 DIAGNOSIS — E349 Endocrine disorder, unspecified: Secondary | ICD-10-CM

## 2022-01-29 LAB — CMP (CANCER CENTER ONLY)
ALT: 23 U/L (ref 0–44)
AST: 26 U/L (ref 15–41)
Albumin: 3.6 g/dL (ref 3.5–5.0)
Alkaline Phosphatase: 96 U/L (ref 38–126)
Anion gap: 5 (ref 5–15)
BUN: 22 mg/dL (ref 8–23)
CO2: 25 mmol/L (ref 22–32)
Calcium: 8.6 mg/dL — ABNORMAL LOW (ref 8.9–10.3)
Chloride: 109 mmol/L (ref 98–111)
Creatinine: 1.9 mg/dL — ABNORMAL HIGH (ref 0.61–1.24)
GFR, Estimated: 37 mL/min — ABNORMAL LOW (ref >60)
Glucose, Bld: 35 mg/dL — CL (ref 70–99)
Potassium: 4.4 mmol/L (ref 3.5–5.1)
Sodium: 139 mmol/L (ref 135–145)
Total Bilirubin: 0.4 mg/dL (ref 0.3–1.2)
Total Protein: 6.5 g/dL (ref 6.5–8.1)

## 2022-01-29 LAB — CBC WITH DIFFERENTIAL (CANCER CENTER ONLY)
Abs Immature Granulocytes: 0.03 10*3/uL (ref 0.00–0.07)
Basophils Absolute: 0.1 10*3/uL (ref 0.0–0.1)
Basophils Relative: 2 %
Eosinophils Absolute: 0.2 10*3/uL (ref 0.0–0.5)
Eosinophils Relative: 4 %
HCT: 29.3 % — ABNORMAL LOW (ref 39.0–52.0)
Hemoglobin: 9.1 g/dL — ABNORMAL LOW (ref 13.0–17.0)
Immature Granulocytes: 1 %
Lymphocytes Relative: 21 %
Lymphs Abs: 0.8 10*3/uL (ref 0.7–4.0)
MCH: 25.1 pg — ABNORMAL LOW (ref 26.0–34.0)
MCHC: 31.1 g/dL (ref 30.0–36.0)
MCV: 80.7 fL (ref 80.0–100.0)
Monocytes Absolute: 0.6 10*3/uL (ref 0.1–1.0)
Monocytes Relative: 15 %
Neutro Abs: 2.2 10*3/uL (ref 1.7–7.7)
Neutrophils Relative %: 57 %
Platelet Count: 168 10*3/uL (ref 150–400)
RBC: 3.63 MIL/uL — ABNORMAL LOW (ref 4.22–5.81)
RDW: 16.4 % — ABNORMAL HIGH (ref 11.5–15.5)
WBC Count: 3.9 10*3/uL — ABNORMAL LOW (ref 4.0–10.5)
nRBC: 0 % (ref 0.0–0.2)

## 2022-01-29 LAB — FERRITIN: Ferritin: 1066 ng/mL — ABNORMAL HIGH (ref 24–336)

## 2022-01-29 LAB — RETICULOCYTES
Immature Retic Fract: 10 % (ref 2.3–15.9)
RBC.: 3.62 MIL/uL — ABNORMAL LOW (ref 4.22–5.81)
Retic Count, Absolute: 25.3 10*3/uL (ref 19.0–186.0)
Retic Ct Pct: 0.7 % (ref 0.4–3.1)

## 2022-01-29 LAB — LACTATE DEHYDROGENASE: LDH: 191 U/L (ref 98–192)

## 2022-01-29 LAB — IRON AND IRON BINDING CAPACITY (CC-WL,HP ONLY)
Iron: 77 ug/dL (ref 45–182)
Saturation Ratios: 35 % (ref 17.9–39.5)
TIBC: 218 ug/dL — ABNORMAL LOW (ref 250–450)
UIBC: 141 ug/dL (ref 117–376)

## 2022-01-29 LAB — GLUCOSE, RANDOM: Glucose, Bld: 197 mg/dL — ABNORMAL HIGH (ref 70–99)

## 2022-01-29 MED ORDER — DEXTROSE 5 % IV SOLN
60.0000 mg | Freq: Once | INTRAVENOUS | Status: AC
  Administered 2022-01-29: 60 mg via INTRAVENOUS
  Filled 2022-01-29: qty 30

## 2022-01-29 MED ORDER — TESTOSTERONE CYPIONATE 200 MG/ML IM SOLN
400.0000 mg | Freq: Once | INTRAMUSCULAR | Status: AC
  Administered 2022-01-29: 400 mg via INTRAMUSCULAR
  Filled 2022-01-29: qty 2

## 2022-01-29 MED ORDER — HEPARIN SOD (PORK) LOCK FLUSH 100 UNIT/ML IV SOLN: 500.0000 [IU] | Freq: Once | INTRAVENOUS | Status: DC

## 2022-01-29 MED ORDER — PALONOSETRON HCL INJECTION 0.25 MG/5ML
0.2500 mg | Freq: Once | INTRAVENOUS | Status: AC
  Administered 2022-01-29: 0.25 mg via INTRAVENOUS
  Filled 2022-01-29: qty 5

## 2022-01-29 MED ORDER — CYANOCOBALAMIN 1000 MCG/ML IJ SOLN
1000.0000 ug | Freq: Once | INTRAMUSCULAR | Status: AC
  Administered 2022-01-29: 1000 ug via INTRAMUSCULAR
  Filled 2022-01-29: qty 1

## 2022-01-29 MED ORDER — SODIUM CHLORIDE 0.9 % IV SOLN
300.0000 mg/m2 | Freq: Once | INTRAVENOUS | Status: AC
  Administered 2022-01-29: 580 mg via INTRAVENOUS
  Filled 2022-01-29: qty 29

## 2022-01-29 MED ORDER — SODIUM CHLORIDE 0.9 % IV SOLN: Freq: Once | INTRAVENOUS | Status: AC

## 2022-01-29 MED ORDER — SODIUM CHLORIDE 0.9 % IV SOLN
60.0000 mg | Freq: Once | INTRAVENOUS | Status: AC
  Administered 2022-01-29: 60 mg via INTRAVENOUS
  Filled 2022-01-29: qty 6.67

## 2022-01-29 MED ORDER — SODIUM CHLORIDE 0.9 % IV SOLN
10.0000 mg | Freq: Once | INTRAVENOUS | Status: AC
  Administered 2022-01-29: 10 mg via INTRAVENOUS
  Filled 2022-01-29: qty 10

## 2022-01-29 MED ORDER — SODIUM CHLORIDE 0.9% FLUSH: 10.0000 mL | Freq: Once | INTRAVENOUS | Status: DC

## 2022-01-29 MED ORDER — HEPARIN SOD (PORK) LOCK FLUSH 100 UNIT/ML IV SOLN
500.0000 [IU] | Freq: Once | INTRAVENOUS | Status: AC | PRN
  Administered 2022-01-29: 500 [IU]

## 2022-01-29 MED ORDER — HYDROMORPHONE HCL 1 MG/ML IJ SOLN
2.0000 mg | Freq: Once | INTRAMUSCULAR | Status: AC
  Administered 2022-01-29: 2 mg via INTRAVENOUS
  Filled 2022-01-29: qty 2

## 2022-01-29 MED ORDER — SODIUM CHLORIDE 0.9% FLUSH
10.0000 mL | INTRAVENOUS | Status: DC | PRN
  Administered 2022-01-29: 10 mL

## 2022-01-29 MED ORDER — EPOETIN ALFA-EPBX 40000 UNIT/ML IJ SOLN
40000.0000 [IU] | Freq: Once | INTRAMUSCULAR | Status: AC
  Administered 2022-01-29: 40000 [IU] via SUBCUTANEOUS
  Filled 2022-01-29: qty 1

## 2022-01-29 NOTE — Patient Instructions (Signed)
[facility]  Discharge Instructions: Thank you for choosing [facility] to provide your oncology and hematology care.   If you have a lab appointment with the [facility], please go directly to the [facility] and check in at the registration area.  Wear comfortable clothing and clothing appropriate for easy access to any Portacath or PICC line.   We strive to give you quality time with your provider. You may need to reschedule your appointment if you arrive late (15 or more minutes).  Arriving late affects you and other patients whose appointments are after yours.  Also, if you miss three or more appointments without notifying the office, you may be dismissed from the clinic at the provider's discretion.      For prescription refill requests, have your pharmacy contact our office and allow 72 hours for refills to be completed.    Today you received the following chemotherapy and/or immunotherapy agents aredia,kyprolis, cytoxan     To help prevent nausea and vomiting after your treatment, we encourage you to take your nausea medication as directed.  BELOW ARE SYMPTOMS THAT SHOULD BE REPORTED IMMEDIATELY: *FEVER GREATER THAN 100.4 F (38 C) OR HIGHER *CHILLS OR SWEATING *NAUSEA AND VOMITING THAT IS NOT CONTROLLED WITH YOUR NAUSEA MEDICATION *UNUSUAL SHORTNESS OF BREATH *UNUSUAL BRUISING OR BLEEDING *URINARY PROBLEMS (pain or burning when urinating, or frequent urination) *BOWEL PROBLEMS (unusual diarrhea, constipation, pain near the anus) TENDERNESS IN MOUTH AND THROAT WITH OR WITHOUT PRESENCE OF ULCERS (sore throat, sores in mouth, or a toothache) UNUSUAL RASH, SWELLING OR PAIN  UNUSUAL VAGINAL DISCHARGE OR ITCHING   Items with * indicate a potential emergency and should be followed up as soon as possible or go to the Emergency Department if any problems should occur.  Please show the CHEMOTHERAPY ALERT CARD or IMMUNOTHERAPY ALERT CARD at  check-in to the Emergency Department and triage nurse. Should you have questions after your visit or need to cancel or reschedule your appointment, please contact [facility]  [phone] and follow the prompts.  Office hours are 8:00 a.m. to 4:30 p.m. Monday - Friday. Please note that voicemails left after 4:00 p.m. may not be returned until the following business day.  We are closed weekends and major holidays. You have access to a nurse at all times for urgent questions. Please call the main number to the clinic [phone] and follow the prompts.  For any non-urgent questions, you may also contact your provider using MyChart. We now offer e-Visits for anyone 26 and older to request care online for non-urgent symptoms. For details visit [email or web address].   Also download the MyChart app! Go to the app store, search "MyChart", open the app, select [facility], and log in with your MyChart username and password.  Masks are optional in the cancer centers. If you would like for your care team to wear a mask while they are taking care of you, please let them know. You may have one support person who is at least 72 years old accompany you for your appointments.

## 2022-01-29 NOTE — Telephone Encounter (Signed)
Per scheduling message Jarrett Soho - called and gave upcoming appointments - requested callback to confirm.

## 2022-01-29 NOTE — Telephone Encounter (Signed)
Dr. [name] notified of glucose-35.  Order received to recheck pt.'s glucose per order of Dr. [name].  Order placed and scheduling notified.

## 2022-01-29 NOTE — Patient Instructions (Signed)

## 2022-01-29 NOTE — Progress Notes (Signed)
Hematology and Oncology Follow Up Visit  [name] [id number] Mar 28, 1950 71 y.o. 01/29/2022   Principle Diagnosis:  IgG kappa myeloma Anemia secondary to renal insufficiency Intermittent iron - deficiency anemia Hypotestosteronemia   Current Therapy:        Aredia 60 mg IV q  month  Retacrit SQ as needed for hemoglobin less than 10 DepoTestosterone 400 mg q 4 weeks IV iron as indicated Kyprolis/Cytoxan - s/p cycle #34   Interim History:  [name] is here today for follow-up and treatment.  The cold weather does bother him.  It does cause problems with his arthritis.  He is still watching his diabetes.  He has had no problems with the chemotherapy.  His last monoclonal studies did not show monoclonal spike in his blood.  His IgG level was 1125 mg/dL.  The Kappa light chain was 5 mg/dL.  He has had no problem bowels or bladder.  He has had no cough or shortness of breath.  He has had little bit of leg swelling.  He does have some fatigue.  Again this has been chronic.  There is been no fever.  He has had no obvious bleeding.  Overall, I would have said that his performance status right now is ECOG 1.   Medications:  Allergies as of 01/29/2022       Reactions   Iodinated Contrast Media Rash   Patient states he was instructed not to take IV contrast.  In 2008 he had an unknown reaction, and was told not to take it again.  He was also told not to take it due to his kidneys.   Iodine Anxiety, Rash, Other (See Comments)   Didn't feel right "instructed not to take per MD--something with his port"        Medication List        Accurate as of January 29, 2022  8:40 AM. If you have any questions, ask your nurse or doctor.          amLODipine 10 MG tablet Commonly known as: NORVASC Take 10 mg by mouth daily.   ammonium lactate 12 % lotion Commonly known as: AmLactin Apply 1 application topically as needed for dry skin.   atorvastatin 40 MG tablet Commonly  known as: LIPITOR Take 40 mg by mouth daily at 6 PM.   budesonide-formoterol 160-4.5 MCG/ACT inhaler Commonly known as: SYMBICORT SMARTSIG:2 Puff(s) By Mouth Twice Daily   Carafate 1 GM/10ML suspension Generic drug: sucralfate Take 1 g by mouth 4 (four) times daily.   carvedilol 25 MG tablet Commonly known as: COREG Take 25 mg by mouth 2 (two) times daily.   chlorpheniramine-HYDROcodone 10-8 MG/5ML Commonly known as: TUSSIONEX SMARTSIG:5 Milliliter(s) By Mouth Every 12 Hours   Comirnaty syringe Generic drug: COVID-19 mRNA vaccine 2023-2024 Inject into the muscle.   [employer] Fiber Gummies 2 g Chew Chew by mouth daily.   diazepam 5 MG tablet Commonly known as: VALIUM Take by mouth at bedtime as needed.   dicyclomine 10 MG capsule Commonly known as: BENTYL TAKE 1 CAPSULE BY MOUTH 3 TIMES A DAY BEFORE MEALS   dronabinol 5 MG capsule Commonly known as: MARINOL Take 1 capsule (5 mg total) by mouth 2 (two) times daily before a meal.   gabapentin 300 MG capsule Commonly known as: NEURONTIN TAKE 1 CAPSULE BY MOUTH THREE TIMES A DAY What changed:  how much to take how to take this when to take this additional instructions   glimepiride 2 MG tablet Commonly  known as: AMARYL Take 2 mg by mouth daily.   hydrocortisone 2.5 % cream Apply topically 2 (two) times daily.   lactulose 10 GM/15ML solution Commonly known as: CHRONULAC Take 30 mLs (20 g total) by mouth daily.   latanoprost 0.005 % ophthalmic solution Commonly known as: XALATAN Place 1 drop into both eyes at bedtime.   linaclotide 145 MCG Caps capsule Commonly known as: LINZESS Take by mouth daily as needed.   losartan 50 MG tablet Commonly known as: COZAAR Take 50 mg by mouth daily.   Lumigan 0.01 % Soln Generic drug: bimatoprost Place 1 drop into both eyes 3 (three) times daily.   mesalamine 1.2 g EC tablet Commonly known as: LIALDA Take by mouth daily with breakfast.   metoprolol succinate 25 MG 24  hr tablet Commonly known as: TOPROL-XL Take 25 mg by mouth daily.   mirabegron ER 25 MG Tb24 tablet Commonly known as: MYRBETRIQ Take 20 mg by mouth daily.   morphine 30 MG 12 hr tablet Commonly known as: MS CONTIN Take 30 mg by mouth 2 (two) times daily.   omeprazole 40 MG capsule Commonly known as: PRILOSEC TAKE 1 CAPSULE BY MOUTH TWICE A DAY   oxyCODONE 5 MG immediate release tablet Commonly known as: Roxicodone 1 tab PO q6 hours prn pain   oxyCODONE-acetaminophen 10-325 MG tablet Commonly known as: PERCOCET Take 1 tablet by mouth every 6 (six) hours as needed for pain.   pioglitazone 30 MG tablet Commonly known as: ACTOS Take 30 mg by mouth daily.   polyethylene glycol powder 17 GM/SCOOP powder Commonly known as: GLYCOLAX/MIRALAX SMARTSIG:1 scoopful By Mouth Twice Daily PRN   prochlorperazine 10 MG tablet Commonly known as: COMPAZINE take 1 tablet by mouth every 6 hours if needed for nausea and vomiting   Restasis 0.05 % ophthalmic emulsion Generic drug: cycloSPORINE Place 1 drop into both eyes daily.   SUMAtriptan 100 MG tablet Commonly known as: IMITREX Take 100 mg by mouth daily.   tadalafil 10 MG tablet Commonly known as: CIALIS Take by mouth.   topiramate 25 MG capsule Commonly known as: TOPAMAX Take 25 mg by mouth 2 (two) times daily.   topiramate 50 MG tablet Commonly known as: TOPAMAX Take 50 mg by mouth at bedtime.   torsemide 20 MG tablet Commonly known as: DEMADEX Take 20 mg by mouth daily.   Trulicity 7.40 CX/4.4YJ Sopn Generic drug: Dulaglutide Inject 0.75 mg into the skin every Saturday.   Vitamin D (Ergocalciferol) 1.25 MG (50000 UNIT) Caps capsule Commonly known as: DRISDOL Take 50,000 Units by mouth every Saturday.        Allergies:  Allergies  Allergen Reactions   Iodinated Contrast Media Rash    Patient states he was instructed not to take IV contrast.  In 2008 he had an unknown reaction, and was told not to take it  again.  He was also told not to take it due to his kidneys.   Iodine Anxiety, Rash and Other (See Comments)    Didn't feel right "instructed not to take per MD--something with his port"     Past Medical History, Surgical history, Social history, and Family History were reviewed and updated.  Review of Systems: Review of Systems  Constitutional:  Positive for malaise/fatigue.  HENT: Negative.    Eyes: Negative.   Respiratory: Negative.    Cardiovascular: Negative.   Gastrointestinal:  Positive for nausea.  Genitourinary: Negative.   Musculoskeletal:  Positive for joint pain and myalgias.  Skin:  Negative.   Neurological: Negative.   Endo/Heme/Allergies: Negative.   Psychiatric/Behavioral: Negative.       Physical Exam:  vitals were not taken for this visit.   Wt Readings from Last 3 Encounters:  12/29/21 165 lb 0.6 oz (74.9 kg)  11/26/21 164 lb 1.6 oz (74.4 kg)  10/28/21 168 lb (76.2 kg)   Physical Exam Vitals reviewed.  HENT:     Head: Normocephalic and atraumatic.  Eyes:     Pupils: Pupils are equal, round, and reactive to light.  Cardiovascular:     Rate and Rhythm: Normal rate and regular rhythm.     Heart sounds: Normal heart sounds.  Pulmonary:     Effort: Pulmonary effort is normal.     Breath sounds: Normal breath sounds.  Abdominal:     General: Bowel sounds are normal.     Palpations: Abdomen is soft.  Musculoskeletal:        General: No tenderness or deformity. Normal range of motion.     Cervical back: Normal range of motion.  Lymphadenopathy:     Cervical: No cervical adenopathy.  Skin:    General: Skin is warm and dry.     Findings: No erythema or rash.  Neurological:     Mental Status: He is alert and oriented to person, place, and time.  Psychiatric:        Behavior: Behavior normal.        Thought Content: Thought content normal.        Judgment: Judgment normal.       Lab Results  Component Value Date   WBC 3.9 (L) 01/29/2022   HGB  9.1 (L) 01/29/2022   HCT 29.3 (L) 01/29/2022   MCV 80.7 01/29/2022   PLT 168 01/29/2022   Lab Results  Component Value Date   FERRITIN 1,310 (H) 12/29/2021   IRON 101 12/29/2021   TIBC 224 (L) 12/29/2021   UIBC 123 12/29/2021   IRONPCTSAT 45 (H) 12/29/2021   Lab Results  Component Value Date   RETICCTPCT 0.7 01/29/2022   RBC 3.62 (L) 01/29/2022   RETICCTABS 27.5 03/21/2014   Lab Results  Component Value Date   KPAFRELGTCHN 50.2 (H) 12/29/2021   LAMBDASER 30.4 (H) 12/29/2021   KAPLAMBRATIO 1.65 12/29/2021   Lab Results  Component Value Date   IGGSERUM 1,125 12/29/2021   IGA 274 12/29/2021   IGMSERUM 57 12/29/2021   Lab Results  Component Value Date   TOTALPROTELP 5.3 (L) 12/29/2021   ALBUMINELP 2.9 12/29/2021   A1GS 0.2 12/29/2021   A2GS 0.6 12/29/2021   BETS 0.7 12/29/2021   BETA2SER 0.5 11/07/2014   GAMS 0.9 12/29/2021   MSPIKE Not Observed 12/29/2021   SPEI Comment 12/29/2021     Chemistry      Component Value Date/Time   NA 140 12/29/2021 0810   NA 143 02/22/2017 0744   K 3.6 12/29/2021 0810   K 4.0 02/22/2017 0744   CL 103 12/29/2021 0810   CL 106 02/22/2017 0744   CO2 28 12/29/2021 0810   CO2 27 02/22/2017 0744   BUN 30 (H) 12/29/2021 0810   BUN 26 (H) 02/22/2017 0744   CREATININE 2.21 (H) 12/29/2021 0810   CREATININE 2.3 (H) 02/22/2017 0744      Component Value Date/Time   CALCIUM 8.9 12/29/2021 0810   CALCIUM 9.3 02/22/2017 0744   ALKPHOS 76 12/29/2021 0810   ALKPHOS 80 02/22/2017 0744   AST 22 12/29/2021 0810   ALT 16 12/29/2021 0810  ALT 22 02/22/2017 0744   BILITOT 0.4 12/29/2021 0810       Impression and Plan: [name] is a very pleasant 71 yo African American gentleman with IgG kappa myeloma.  We have been doing this for many years.  So far, he is done incredibly well.  He is on a very good treatment that he is tolerating well.  He clearly needs to have Aranesp today.  He will go ahead and get this.  Will go also give him  Aredia.  We will plan to have him come back to see [place] after the Christmas holiday.  I will make sure that he gets his treatments in between doctor visits.  Volanda Napoleon, MD 12/7/20238:40 AM

## 2022-01-30 ENCOUNTER — Other Ambulatory Visit: Payer: Self-pay

## 2022-01-31 LAB — TESTOSTERONE: Testosterone: 274 ng/dL (ref 264–916)

## 2022-01-31 LAB — IGG, IGA, IGM
IgA: 258 mg/dL (ref 61–437)
IgG (Immunoglobin G), Serum: 1027 mg/dL (ref 603–1613)
IgM (Immunoglobulin M), Srm: 50 mg/dL (ref 15–143)

## 2022-02-02 ENCOUNTER — Other Ambulatory Visit: Payer: Self-pay

## 2022-02-02 LAB — PROTEIN ELECTROPHORESIS, SERUM
A/G Ratio: 1.3 (ref 0.7–1.7)
Albumin ELP: 3.6 g/dL (ref 2.9–4.4)
Alpha-1-Globulin: 0.2 g/dL (ref 0.0–0.4)
Alpha-2-Globulin: 0.8 g/dL (ref 0.4–1.0)
Beta Globulin: 0.8 g/dL (ref 0.7–1.3)
Gamma Globulin: 0.9 g/dL (ref 0.4–1.8)
Globulin, Total: 2.7 g/dL (ref 2.2–3.9)
Total Protein ELP: 6.3 g/dL (ref 6.0–8.5)

## 2022-02-02 LAB — KAPPA/LAMBDA LIGHT CHAINS
Kappa free light chain: 53.8 mg/L — ABNORMAL HIGH (ref 3.3–19.4)
Kappa, lambda light chain ratio: 2.11 — ABNORMAL HIGH (ref 0.26–1.65)
Lambda free light chains: 25.5 mg/L (ref 5.7–26.3)

## 2022-02-06 ENCOUNTER — Other Ambulatory Visit: Payer: Self-pay

## 2022-02-12 ENCOUNTER — Inpatient Hospital Stay: Payer: 59

## 2022-02-12 VITALS — BP 118/50 | HR 73 | Temp 97.9°F | Resp 18

## 2022-02-12 DIAGNOSIS — C9000 Multiple myeloma not having achieved remission: Secondary | ICD-10-CM

## 2022-02-12 DIAGNOSIS — Z5112 Encounter for antineoplastic immunotherapy: Secondary | ICD-10-CM | POA: Diagnosis not present

## 2022-02-12 DIAGNOSIS — E349 Endocrine disorder, unspecified: Secondary | ICD-10-CM

## 2022-02-12 DIAGNOSIS — D631 Anemia in chronic kidney disease: Secondary | ICD-10-CM

## 2022-02-12 DIAGNOSIS — Z299 Encounter for prophylactic measures, unspecified: Secondary | ICD-10-CM

## 2022-02-12 LAB — CMP (CANCER CENTER ONLY)
ALT: 12 U/L (ref 0–44)
AST: 14 U/L — ABNORMAL LOW (ref 15–41)
Albumin: 3.7 g/dL (ref 3.5–5.0)
Alkaline Phosphatase: 83 U/L (ref 38–126)
Anion gap: 6 (ref 5–15)
BUN: 24 mg/dL — ABNORMAL HIGH (ref 8–23)
CO2: 27 mmol/L (ref 22–32)
Calcium: 8 mg/dL — ABNORMAL LOW (ref 8.9–10.3)
Chloride: 106 mmol/L (ref 98–111)
Creatinine: 2.35 mg/dL — ABNORMAL HIGH (ref 0.61–1.24)
GFR, Estimated: 29 mL/min — ABNORMAL LOW (ref >60)
Glucose, Bld: 98 mg/dL (ref 70–99)
Potassium: 4.7 mmol/L (ref 3.5–5.1)
Sodium: 139 mmol/L (ref 135–145)
Total Bilirubin: 0.4 mg/dL (ref 0.3–1.2)
Total Protein: 6.1 g/dL — ABNORMAL LOW (ref 6.5–8.1)

## 2022-02-12 LAB — CBC WITH DIFFERENTIAL (CANCER CENTER ONLY)
Abs Immature Granulocytes: 0.03 10*3/uL (ref 0.00–0.07)
Basophils Absolute: 0 10*3/uL (ref 0.0–0.1)
Basophils Relative: 1 %
Eosinophils Absolute: 0.2 10*3/uL (ref 0.0–0.5)
Eosinophils Relative: 3 %
HCT: 28.5 % — ABNORMAL LOW (ref 39.0–52.0)
Hemoglobin: 9 g/dL — ABNORMAL LOW (ref 13.0–17.0)
Immature Granulocytes: 1 %
Lymphocytes Relative: 18 %
Lymphs Abs: 0.9 10*3/uL (ref 0.7–4.0)
MCH: 25.1 pg — ABNORMAL LOW (ref 26.0–34.0)
MCHC: 31.6 g/dL (ref 30.0–36.0)
MCV: 79.4 fL — ABNORMAL LOW (ref 80.0–100.0)
Monocytes Absolute: 0.6 10*3/uL (ref 0.1–1.0)
Monocytes Relative: 13 %
Neutro Abs: 3 10*3/uL (ref 1.7–7.7)
Neutrophils Relative %: 64 %
Platelet Count: 205 10*3/uL (ref 150–400)
RBC: 3.59 MIL/uL — ABNORMAL LOW (ref 4.22–5.81)
RDW: 17 % — ABNORMAL HIGH (ref 11.5–15.5)
WBC Count: 4.7 10*3/uL (ref 4.0–10.5)
nRBC: 0 % (ref 0.0–0.2)

## 2022-02-12 MED ORDER — EPOETIN ALFA-EPBX 40000 UNIT/ML IJ SOLN
40000.0000 [IU] | Freq: Once | INTRAMUSCULAR | Status: AC
  Administered 2022-02-12: 40000 [IU] via SUBCUTANEOUS
  Filled 2022-02-12: qty 1

## 2022-02-12 MED ORDER — CYANOCOBALAMIN 1000 MCG/ML IJ SOLN
1000.0000 ug | Freq: Once | INTRAMUSCULAR | Status: AC
  Administered 2022-02-12: 1000 ug via INTRAMUSCULAR
  Filled 2022-02-12: qty 1

## 2022-02-12 MED ORDER — HEPARIN SOD (PORK) LOCK FLUSH 100 UNIT/ML IV SOLN
500.0000 [IU] | Freq: Once | INTRAVENOUS | Status: AC | PRN
  Administered 2022-02-12: 500 [IU]

## 2022-02-12 MED ORDER — SODIUM CHLORIDE 0.9 % IV SOLN: Freq: Once | INTRAVENOUS | Status: AC

## 2022-02-12 MED ORDER — PALONOSETRON HCL INJECTION 0.25 MG/5ML
0.2500 mg | Freq: Once | INTRAVENOUS | Status: AC
  Administered 2022-02-12: 0.25 mg via INTRAVENOUS
  Filled 2022-02-12: qty 5

## 2022-02-12 MED ORDER — HYDROMORPHONE HCL 1 MG/ML IJ SOLN
2.0000 mg | Freq: Once | INTRAMUSCULAR | Status: AC
  Administered 2022-02-12: 2 mg via INTRAVENOUS
  Filled 2022-02-12: qty 2

## 2022-02-12 MED ORDER — SODIUM CHLORIDE 0.9 % IV SOLN
300.0000 mg/m2 | Freq: Once | INTRAVENOUS | Status: AC
  Administered 2022-02-12: 580 mg via INTRAVENOUS
  Filled 2022-02-12: qty 29

## 2022-02-12 MED ORDER — SODIUM CHLORIDE 0.9% FLUSH
10.0000 mL | INTRAVENOUS | Status: DC | PRN
  Administered 2022-02-12: 10 mL

## 2022-02-12 MED ORDER — SODIUM CHLORIDE 0.9 % IV SOLN
10.0000 mg | Freq: Once | INTRAVENOUS | Status: AC
  Administered 2022-02-12: 10 mg via INTRAVENOUS
  Filled 2022-02-12: qty 10

## 2022-02-12 MED ORDER — DEXTROSE 5 % IV SOLN
36.0000 mg/m2 | Freq: Once | INTRAVENOUS | Status: AC
  Administered 2022-02-12: 70 mg via INTRAVENOUS
  Filled 2022-02-12: qty 30

## 2022-02-12 MED ORDER — SODIUM CHLORIDE 0.9% FLUSH
10.0000 mL | Freq: Once | INTRAVENOUS | Status: AC
  Administered 2022-02-12: 10 mL

## 2022-02-12 NOTE — Progress Notes (Signed)
Per Dr. Ennever, okay to treat today despite labs ?

## 2022-02-12 NOTE — Patient Instructions (Signed)

## 2022-02-12 NOTE — Patient Instructions (Signed)
[facility]  Discharge Instructions: Thank you for choosing [facility] to provide your oncology and hematology care.   If you have a lab appointment with the [facility], please go directly to the [facility] and check in at the registration area.  Wear comfortable clothing and clothing appropriate for easy access to any Portacath or PICC line.   We strive to give you quality time with your provider. You may need to reschedule your appointment if you arrive late (15 or more minutes).  Arriving late affects you and other patients whose appointments are after yours.  Also, if you miss three or more appointments without notifying the office, you may be dismissed from the clinic at the provider's discretion.      For prescription refill requests, have your pharmacy contact our office and allow 72 hours for refills to be completed.    Today you received the following chemotherapy and/or immunotherapy agents Cytoxan, Kyprolis      To help prevent nausea and vomiting after your treatment, we encourage you to take your nausea medication as directed.  BELOW ARE SYMPTOMS THAT SHOULD BE REPORTED IMMEDIATELY: *FEVER GREATER THAN 100.4 F (38 C) OR HIGHER *CHILLS OR SWEATING *NAUSEA AND VOMITING THAT IS NOT CONTROLLED WITH YOUR NAUSEA MEDICATION *UNUSUAL SHORTNESS OF BREATH *UNUSUAL BRUISING OR BLEEDING *URINARY PROBLEMS (pain or burning when urinating, or frequent urination) *BOWEL PROBLEMS (unusual diarrhea, constipation, pain near the anus) TENDERNESS IN MOUTH AND THROAT WITH OR WITHOUT PRESENCE OF ULCERS (sore throat, sores in mouth, or a toothache) UNUSUAL RASH, SWELLING OR PAIN  UNUSUAL VAGINAL DISCHARGE OR ITCHING   Items with * indicate a potential emergency and should be followed up as soon as possible or go to the Emergency Department if any problems should occur.  Please show the CHEMOTHERAPY ALERT CARD or IMMUNOTHERAPY ALERT CARD at check-in  to the Emergency Department and triage nurse. Should you have questions after your visit or need to cancel or reschedule your appointment, please contact [facility]  [phone] and follow the prompts.  Office hours are 8:00 a.m. to 4:30 p.m. Monday - Friday. Please note that voicemails left after 4:00 p.m. may not be returned until the following business day.  We are closed weekends and major holidays. You have access to a nurse at all times for urgent questions. Please call the main number to the clinic [phone] and follow the prompts.  For any non-urgent questions, you may also contact your provider using MyChart. We now offer e-Visits for anyone 2 and older to request care online for non-urgent symptoms. For details visit [email or web address].   Also download the MyChart app! Go to the app store, search "MyChart", open the app, select [facility], and log in with your MyChart username and password.  Masks are optional in the cancer centers. If you would like for your care team to wear a mask while they are taking care of you, please let them know. You may have one support person who is at least 72 years old accompany you for your appointments.

## 2022-03-04 ENCOUNTER — Telehealth: Payer: Self-pay

## 2022-03-04 NOTE — Telephone Encounter (Signed)
Received phone call from patient who stated that he had a tooth break off in his gum one week ago. Pt states he did see a dentist and was placed on antibiotics. Pt stated he was calling to see what we could do. Pt educated that the dentist is welcome to call and speak to a provider or send clearance paperwork to have tooth removed. Pt states he didn't know what the dentist needed. Pt aware that he has appointment tomorrow and appt added for patient to see provider to make sure patient was ok for chemotherapy. Pt verbalized understanding and had no further questions. Appointment added and pt aware.

## 2022-03-05 ENCOUNTER — Other Ambulatory Visit: Payer: Self-pay

## 2022-03-05 ENCOUNTER — Encounter: Payer: Self-pay | Admitting: Hematology & Oncology

## 2022-03-05 ENCOUNTER — Inpatient Hospital Stay: Payer: 59

## 2022-03-05 ENCOUNTER — Inpatient Hospital Stay (HOSPITAL_BASED_OUTPATIENT_CLINIC_OR_DEPARTMENT_OTHER): Payer: 59 | Admitting: Hematology & Oncology

## 2022-03-05 ENCOUNTER — Inpatient Hospital Stay: Payer: 59 | Attending: Hematology & Oncology

## 2022-03-05 VITALS — BP 141/75 | HR 80 | Temp 98.2°F | Resp 18 | Ht 71.0 in | Wt 167.0 lb

## 2022-03-05 DIAGNOSIS — Z299 Encounter for prophylactic measures, unspecified: Secondary | ICD-10-CM

## 2022-03-05 DIAGNOSIS — C9000 Multiple myeloma not having achieved remission: Secondary | ICD-10-CM

## 2022-03-05 DIAGNOSIS — Z5112 Encounter for antineoplastic immunotherapy: Secondary | ICD-10-CM | POA: Diagnosis not present

## 2022-03-05 DIAGNOSIS — E291 Testicular hypofunction: Secondary | ICD-10-CM | POA: Diagnosis not present

## 2022-03-05 DIAGNOSIS — E119 Type 2 diabetes mellitus without complications: Secondary | ICD-10-CM | POA: Diagnosis not present

## 2022-03-05 DIAGNOSIS — D631 Anemia in chronic kidney disease: Secondary | ICD-10-CM

## 2022-03-05 DIAGNOSIS — N184 Chronic kidney disease, stage 4 (severe): Secondary | ICD-10-CM | POA: Insufficient documentation

## 2022-03-05 DIAGNOSIS — Z5111 Encounter for antineoplastic chemotherapy: Secondary | ICD-10-CM | POA: Diagnosis present

## 2022-03-05 DIAGNOSIS — E349 Endocrine disorder, unspecified: Secondary | ICD-10-CM

## 2022-03-05 LAB — CMP (CANCER CENTER ONLY)
ALT: 14 U/L (ref 0–44)
AST: 18 U/L (ref 15–41)
Albumin: 4.2 g/dL (ref 3.5–5.0)
Alkaline Phosphatase: 91 U/L (ref 38–126)
Anion gap: 7 (ref 5–15)
BUN: 23 mg/dL (ref 8–23)
CO2: 26 mmol/L (ref 22–32)
Calcium: 9.3 mg/dL (ref 8.9–10.3)
Chloride: 105 mmol/L (ref 98–111)
Creatinine: 2.09 mg/dL — ABNORMAL HIGH (ref 0.61–1.24)
GFR, Estimated: 33 mL/min — ABNORMAL LOW (ref >60)
Glucose, Bld: 102 mg/dL — ABNORMAL HIGH (ref 70–99)
Potassium: 5 mmol/L (ref 3.5–5.1)
Sodium: 138 mmol/L (ref 135–145)
Total Bilirubin: 0.4 mg/dL (ref 0.3–1.2)
Total Protein: 7.3 g/dL (ref 6.5–8.1)

## 2022-03-05 LAB — CBC WITH DIFFERENTIAL (CANCER CENTER ONLY)
Abs Immature Granulocytes: 0.03 10*3/uL (ref 0.00–0.07)
Basophils Absolute: 0.1 10*3/uL (ref 0.0–0.1)
Basophils Relative: 1 %
Eosinophils Absolute: 0 10*3/uL (ref 0.0–0.5)
Eosinophils Relative: 0 %
HCT: 34.5 % — ABNORMAL LOW (ref 39.0–52.0)
Hemoglobin: 10.7 g/dL — ABNORMAL LOW (ref 13.0–17.0)
Immature Granulocytes: 1 %
Lymphocytes Relative: 10 %
Lymphs Abs: 0.5 10*3/uL — ABNORMAL LOW (ref 0.7–4.0)
MCH: 24.7 pg — ABNORMAL LOW (ref 26.0–34.0)
MCHC: 31 g/dL (ref 30.0–36.0)
MCV: 79.7 fL — ABNORMAL LOW (ref 80.0–100.0)
Monocytes Absolute: 0.5 10*3/uL (ref 0.1–1.0)
Monocytes Relative: 10 %
Neutro Abs: 4 10*3/uL (ref 1.7–7.7)
Neutrophils Relative %: 78 %
Platelet Count: 186 10*3/uL (ref 150–400)
RBC: 4.33 MIL/uL (ref 4.22–5.81)
RDW: 16.3 % — ABNORMAL HIGH (ref 11.5–15.5)
WBC Count: 5.1 10*3/uL (ref 4.0–10.5)
nRBC: 0 % (ref 0.0–0.2)

## 2022-03-05 LAB — LACTATE DEHYDROGENASE: LDH: 207 U/L — ABNORMAL HIGH (ref 98–192)

## 2022-03-05 MED ORDER — CYANOCOBALAMIN 1000 MCG/ML IJ SOLN
1000.0000 ug | Freq: Once | INTRAMUSCULAR | Status: AC
  Administered 2022-03-05: 1000 ug via INTRAMUSCULAR
  Filled 2022-03-05: qty 1

## 2022-03-05 MED ORDER — DEXTROSE 5 % IV SOLN
36.0000 mg/m2 | Freq: Once | INTRAVENOUS | Status: AC
  Administered 2022-03-05: 70 mg via INTRAVENOUS
  Filled 2022-03-05: qty 30

## 2022-03-05 MED ORDER — PALONOSETRON HCL INJECTION 0.25 MG/5ML
0.2500 mg | Freq: Once | INTRAVENOUS | Status: AC
  Administered 2022-03-05: 0.25 mg via INTRAVENOUS
  Filled 2022-03-05: qty 5

## 2022-03-05 MED ORDER — TESTOSTERONE CYPIONATE 200 MG/ML IM SOLN
400.0000 mg | Freq: Once | INTRAMUSCULAR | Status: AC
  Administered 2022-03-05: 400 mg via INTRAMUSCULAR
  Filled 2022-03-05: qty 2

## 2022-03-05 MED ORDER — HYDROMORPHONE HCL 1 MG/ML IJ SOLN
2.0000 mg | Freq: Once | INTRAMUSCULAR | Status: AC
  Administered 2022-03-05: 2 mg via INTRAVENOUS
  Filled 2022-03-05: qty 2

## 2022-03-05 MED ORDER — HEPARIN SOD (PORK) LOCK FLUSH 100 UNIT/ML IV SOLN
500.0000 [IU] | Freq: Once | INTRAVENOUS | Status: AC | PRN
  Administered 2022-03-05: 500 [IU]

## 2022-03-05 MED ORDER — SODIUM CHLORIDE 0.9% FLUSH
10.0000 mL | Freq: Once | INTRAVENOUS | Status: AC
  Administered 2022-03-05: 10 mL

## 2022-03-05 MED ORDER — SODIUM CHLORIDE 0.9 % IV SOLN
300.0000 mg/m2 | Freq: Once | INTRAVENOUS | Status: AC
  Administered 2022-03-05: 580 mg via INTRAVENOUS
  Filled 2022-03-05: qty 29

## 2022-03-05 MED ORDER — SODIUM CHLORIDE 0.9% FLUSH
10.0000 mL | INTRAVENOUS | Status: DC | PRN
  Administered 2022-03-05: 10 mL

## 2022-03-05 MED ORDER — SODIUM CHLORIDE 0.9 % IV SOLN
10.0000 mg | Freq: Once | INTRAVENOUS | Status: AC
  Administered 2022-03-05: 10 mg via INTRAVENOUS
  Filled 2022-03-05: qty 10

## 2022-03-05 MED ORDER — SODIUM CHLORIDE 0.9 % IV SOLN: Freq: Once | INTRAVENOUS | Status: AC

## 2022-03-05 MED ORDER — SODIUM CHLORIDE 0.9 % IV SOLN: Freq: Once | INTRAVENOUS | Status: DC

## 2022-03-05 NOTE — Progress Notes (Signed)
Ok to treat with creatinine of 2.09 per Dr [name]. dph

## 2022-03-05 NOTE — Progress Notes (Signed)
Hematology and Oncology Follow Up Visit  [name] [id number] [birth year] 72 y.o. 03/05/2022   Principle Diagnosis:  IgG kappa myeloma Anemia secondary to renal insufficiency Intermittent iron - deficiency anemia Hypotestosteronemia   Current Therapy:        Aredia 60 mg IV q  month  -- on hold starting 03/05/2022 -- needs tooth extrated Retacrit SQ as needed for hemoglobin less than 10 DepoTestosterone 400 mg q 4 weeks IV iron as indicated Kyprolis/Cytoxan - s/p cycle #35   Interim History:  [name] is here today for follow-up and treatment.  Main problem that he has right now is he has a tooth that is not good.  He apparently does have been on Christmas.  I spoke to his a dentist.  It is the molar in the left mandible.  He is going need to have this extracted.  The dentist told her that he is can refer him to oral surgery.  He did x-rays.  There is no evidence of osteonecrosis.  I am going to stop the Aredia.  I probably will not have iridium restart until the Summer.  I told the dentist that I probably would not have the teeth pulled until March.  As far as the myeloma, this has been doing okay.  He is bothered by the cold.  He has a lot of arthritis.  He has diabetes.  I think is doing a decent job with the diabetes.  The cold weather does bother him.  It does cause problems with his arthritis.  His last myeloma studies that were done back in December did not show monoclonal spike in his blood.  His IgG level was 1027 mg/dL.  The Kappa light chain was 5.4 mg/dL.  His last iron studies that were done back in December showed a ferritin of 1066 with an iron saturation of 35%.  He has had no bleeding.  He has had no fever.  Thankfully, there is been no issues with cough or shortness of breath.  He has had no nausea or vomiting.  Overall, I would say his performance status is probably ECOG 1.    Medications:  Allergies as of 03/05/2022       Reactions   Iodinated  Contrast Media Rash   Patient states he was instructed not to take IV contrast.  In 2008 he had an unknown reaction, and was told not to take it again.  He was also told not to take it due to his kidneys.   Iodine Anxiety, Rash, Other (See Comments)   Didn't feel right "instructed not to take per MD--something with his port"        Medication List        Accurate as of March 05, 2022  8:30 AM. If you have any questions, ask your nurse or doctor.          amLODipine 10 MG tablet Commonly known as: NORVASC Take 10 mg by mouth daily.   ammonium lactate 12 % lotion Commonly known as: AmLactin Apply 1 application topically as needed for dry skin.   atorvastatin 40 MG tablet Commonly known as: LIPITOR Take 40 mg by mouth daily at 6 PM.   budesonide-formoterol 160-4.5 MCG/ACT inhaler Commonly known as: SYMBICORT SMARTSIG:2 Puff(s) By Mouth Twice Daily   Carafate 1 GM/10ML suspension Generic drug: sucralfate Take 1 g by mouth 4 (four) times daily.   carvedilol 25 MG tablet Commonly known as: COREG Take 25 mg by mouth 2 (two) times  daily.   chlorpheniramine-HYDROcodone 10-8 MG/5ML Commonly known as: TUSSIONEX SMARTSIG:5 Milliliter(s) By Mouth Every 12 Hours   Comirnaty syringe Generic drug: COVID-19 mRNA vaccine 2023-2024 Inject into the muscle.   [employer] Fiber Gummies 2 g Chew Chew by mouth daily.   diazepam 5 MG tablet Commonly known as: VALIUM Take by mouth at bedtime as needed.   dicyclomine 10 MG capsule Commonly known as: BENTYL TAKE 1 CAPSULE BY MOUTH 3 TIMES A DAY BEFORE MEALS   dronabinol 5 MG capsule Commonly known as: MARINOL Take 1 capsule (5 mg total) by mouth 2 (two) times daily before a meal.   gabapentin 300 MG capsule Commonly known as: NEURONTIN TAKE 1 CAPSULE BY MOUTH THREE TIMES A DAY What changed:  how much to take how to take this when to take this additional instructions   glimepiride 2 MG tablet Commonly known as: AMARYL Take 2  mg by mouth daily.   hydrocortisone 2.5 % cream Apply topically 2 (two) times daily.   lactulose 10 GM/15ML solution Commonly known as: CHRONULAC Take 30 mLs (20 g total) by mouth daily.   latanoprost 0.005 % ophthalmic solution Commonly known as: XALATAN Place 1 drop into both eyes at bedtime.   linaclotide 145 MCG Caps capsule Commonly known as: LINZESS Take by mouth daily as needed.   losartan 50 MG tablet Commonly known as: COZAAR Take 50 mg by mouth daily.   Lumigan 0.01 % Soln Generic drug: bimatoprost Place 1 drop into both eyes 3 (three) times daily.   mesalamine 1.2 g EC tablet Commonly known as: LIALDA Take by mouth daily with breakfast.   metoprolol succinate 25 MG 24 hr tablet Commonly known as: TOPROL-XL Take 25 mg by mouth daily.   mirabegron ER 25 MG Tb24 tablet Commonly known as: MYRBETRIQ Take 20 mg by mouth daily.   morphine 30 MG 12 hr tablet Commonly known as: MS CONTIN Take 30 mg by mouth 2 (two) times daily.   omeprazole 40 MG capsule Commonly known as: PRILOSEC TAKE 1 CAPSULE BY MOUTH TWICE A DAY   oxyCODONE 5 MG immediate release tablet Commonly known as: Roxicodone 1 tab PO q6 hours prn pain   oxyCODONE-acetaminophen 10-325 MG tablet Commonly known as: PERCOCET Take 1 tablet by mouth every 6 (six) hours as needed for pain.   penicillin v potassium 500 MG tablet Commonly known as: VEETID Take 500 mg by mouth 4 (four) times daily.   pioglitazone 30 MG tablet Commonly known as: ACTOS Take 30 mg by mouth daily.   polyethylene glycol powder 17 GM/SCOOP powder Commonly known as: GLYCOLAX/MIRALAX SMARTSIG:1 scoopful By Mouth Twice Daily PRN   prochlorperazine 10 MG tablet Commonly known as: COMPAZINE take 1 tablet by mouth every 6 hours if needed for nausea and vomiting   Restasis 0.05 % ophthalmic emulsion Generic drug: cycloSPORINE Place 1 drop into both eyes daily.   SUMAtriptan 100 MG tablet Commonly known as:  IMITREX Take 100 mg by mouth daily.   tadalafil 10 MG tablet Commonly known as: CIALIS Take by mouth.   topiramate 25 MG capsule Commonly known as: TOPAMAX Take 25 mg by mouth 2 (two) times daily.   topiramate 50 MG tablet Commonly known as: TOPAMAX Take 50 mg by mouth at bedtime.   torsemide 20 MG tablet Commonly known as: DEMADEX Take 20 mg by mouth daily.   Trulicity 8.18 EX/9.3ZJ Sopn Generic drug: Dulaglutide Inject 0.75 mg into the skin every Saturday.   Vitamin D (Ergocalciferol) 1.25 MG (  50000 UNIT) Caps capsule Commonly known as: DRISDOL Take 50,000 Units by mouth every Saturday.        Allergies:  Allergies  Allergen Reactions   Iodinated Contrast Media Rash    Patient states he was instructed not to take IV contrast.  In 2008 he had an unknown reaction, and was told not to take it again.  He was also told not to take it due to his kidneys.   Iodine Anxiety, Rash and Other (See Comments)    Didn't feel right "instructed not to take per MD--something with his port"     Past Medical History, Surgical history, Social history, and Family History were reviewed and updated.  Review of Systems: Review of Systems  Constitutional:  Positive for malaise/fatigue.  HENT: Negative.    Eyes: Negative.   Respiratory: Negative.    Cardiovascular: Negative.   Gastrointestinal:  Positive for nausea.  Genitourinary: Negative.   Musculoskeletal:  Positive for joint pain and myalgias.  Skin: Negative.   Neurological: Negative.   Endo/Heme/Allergies: Negative.   Psychiatric/Behavioral: Negative.       Physical Exam:  height is '5\' 11"'$  (1.803 m) and weight is 167 lb (75.8 kg). His oral temperature is 98.2 F (36.8 C). His blood pressure is 141/75 (abnormal) and his pulse is 80. His respiration is 18 and oxygen saturation is 98%.   Wt Readings from Last 3 Encounters:  03/05/22 167 lb (75.8 kg)  01/29/22 168 lb 1.9 oz (76.3 kg)  12/29/21 165 lb 0.6 oz (74.9 kg)    Physical Exam Vitals reviewed.  HENT:     Head: Normocephalic and atraumatic.  Eyes:     Pupils: Pupils are equal, round, and reactive to light.  Cardiovascular:     Rate and Rhythm: Normal rate and regular rhythm.     Heart sounds: Normal heart sounds.  Pulmonary:     Effort: Pulmonary effort is normal.     Breath sounds: Normal breath sounds.  Abdominal:     General: Bowel sounds are normal.     Palpations: Abdomen is soft.  Musculoskeletal:        General: No tenderness or deformity. Normal range of motion.     Cervical back: Normal range of motion.  Lymphadenopathy:     Cervical: No cervical adenopathy.  Skin:    General: Skin is warm and dry.     Findings: No erythema or rash.  Neurological:     Mental Status: He is alert and oriented to person, place, and time.  Psychiatric:        Behavior: Behavior normal.        Thought Content: Thought content normal.        Judgment: Judgment normal.      Lab Results  Component Value Date   WBC 5.1 03/05/2022   HGB 10.7 (L) 03/05/2022   HCT 34.5 (L) 03/05/2022   MCV 79.7 (L) 03/05/2022   PLT 186 03/05/2022   Lab Results  Component Value Date   FERRITIN 1,066 (H) 01/29/2022   IRON 77 01/29/2022   TIBC 218 (L) 01/29/2022   UIBC 141 01/29/2022   IRONPCTSAT 35 01/29/2022   Lab Results  Component Value Date   RETICCTPCT 0.7 01/29/2022   RBC 4.33 03/05/2022   RETICCTABS 27.5 03/21/2014   Lab Results  Component Value Date   KPAFRELGTCHN 53.8 (H) 01/29/2022   LAMBDASER 25.5 01/29/2022   KAPLAMBRATIO 2.11 (H) 01/29/2022   Lab Results  Component Value Date   IGGSERUM  1,027 01/29/2022   IGA 258 01/29/2022   IGMSERUM 50 01/29/2022   Lab Results  Component Value Date   TOTALPROTELP 6.3 01/29/2022   ALBUMINELP 3.6 01/29/2022   A1GS 0.2 01/29/2022   A2GS 0.8 01/29/2022   BETS 0.8 01/29/2022   BETA2SER 0.5 11/07/2014   GAMS 0.9 01/29/2022   MSPIKE Not Observed 01/29/2022   SPEI Comment 01/29/2022      Chemistry      Component Value Date/Time   NA 139 02/12/2022 0800   NA 143 02/22/2017 0744   K 4.7 02/12/2022 0800   K 4.0 02/22/2017 0744   CL 106 02/12/2022 0800   CL 106 02/22/2017 0744   CO2 27 02/12/2022 0800   CO2 27 02/22/2017 0744   BUN 24 (H) 02/12/2022 0800   BUN 26 (H) 02/22/2017 0744   CREATININE 2.35 (H) 02/12/2022 0800   CREATININE 2.3 (H) 02/22/2017 0744      Component Value Date/Time   CALCIUM 8.0 (L) 02/12/2022 0800   CALCIUM 9.3 02/22/2017 0744   ALKPHOS 83 02/12/2022 0800   ALKPHOS 80 02/22/2017 0744   AST 14 (L) 02/12/2022 0800   ALT 12 02/12/2022 0800   ALT 22 02/22/2017 0744   BILITOT 0.4 02/12/2022 0800       Impression and Plan: [name] is a very pleasant 72 yo African American gentleman with IgG kappa myeloma.  We have been doing this for many years.  So far, he is done incredibly well.  He is on a very good treatment that he is tolerating well.  We cannot give him Aranesp because of the fact that is getting chemotherapy.  The main issue right now is stopping the Aredia until he has his tooth pulled.  I would then wait 3 months after he has a tooth pulled before I would restart the Aredia to help minimize osteonecrosis.  He has responded incredibly well to the Kyprolis/Cytoxan.  He really does not get treatment all that much.  I am very impressed by his response.    Of note, he also gets testosterone.  His last testosterone level in December was 274.  He will get treatment in 2 weeks.  Because he is doing so well, I think we can probably get him back to see me in about 6 weeks.    Volanda Napoleon, MD 1/11/20248:30 AM

## 2022-03-05 NOTE — Patient Instructions (Addendum)
Implanted Port Home Guide An implanted port is a device that is placed under the skin. It is usually placed in the chest. The device may vary based on the need. Implanted ports can be used to give IV medicine, to take blood, or to give fluids. You may have an implanted port if: You need IV medicine that would be irritating to the small veins in your hands or arms. You need IV medicines, such as chemotherapy, for a long period of time. You need IV nutrition for a long period of time. You may have fewer limitations when using a port than you would if you used other types of long-term IVs. You will also likely be able to return to normal activities after your incision heals. An implanted port has two main parts: Reservoir. The reservoir is the part where a needle is inserted to give medicines or draw blood. The reservoir is round. After the port is placed, it appears as a small, raised area under your skin. Catheter. The catheter is a small, thin tube that connects the reservoir to a vein. Medicine that is inserted into the reservoir goes into the catheter and then into the vein. How is my port accessed? To access your port: A numbing cream may be placed on the skin over the port site. Your health care provider will put on a mask and sterile gloves. The skin over your port will be cleaned carefully with a germ-killing soap and allowed to dry. Your health care provider will gently pinch the port and insert a needle into it. Your health care provider will check for a blood return to make sure the port is in the vein and is still working (patent). If your port needs to remain accessed to get medicine continuously (constant infusion), your health care provider will place a clear bandage (dressing) over the needle site. The dressing and needle will need to be changed every week, or as told by your health care provider. What is flushing? Flushing helps keep the port working. Follow instructions from your  health care provider about how and when to flush the port. Ports are usually flushed with saline solution or a medicine called heparin. The need for flushing will depend on how the port is used: If the port is only used from time to time to give medicines or draw blood, the port may need to be flushed: Before and after medicines have been given. Before and after blood has been drawn. As part of routine maintenance. Flushing may be recommended every 4-6 weeks. If a constant infusion is running, the port may not need to be flushed. Throw away any syringes in a disposal container that is meant for sharp items (sharps container). You can buy a sharps container from a pharmacy, or you can make one by using an empty hard plastic bottle with a cover. How long will my port stay implanted? The port can stay in for as long as your health care provider thinks it is needed. When it is time for the port to come out, a surgery will be done to remove it. The surgery will be similar to the procedure that was done to put the port in. Follow these instructions at home: Caring for your port and port site Flush your port as told by your health care provider. If you need an infusion over several days, follow instructions from your health care provider about how to take care of your port site. Make sure you: Change your   dressing as told by your health care provider. Wash your hands with soap and water for at least 20 seconds before and after you change your dressing. If soap and water are not available, use alcohol-based hand sanitizer. Place any used dressings or infusion bags into a plastic bag. Throw that bag in the trash. Keep the dressing that covers the needle clean and dry. Do not get it wet. Do not use scissors or sharp objects near the infusion tubing. Keep any external tubes clamped, unless they are being used. Check your port site every day for signs of infection. Check for: Redness, swelling, or  pain. Fluid or blood. Warmth. Pus or a bad smell. Protect the skin around the port site. Avoid wearing bra straps that rub or irritate the site. Protect the skin around your port from seat belts. Place a soft pad over your chest if needed. Bathe or shower as told by your health care provider. The site may get wet as long as you are not actively receiving an infusion. General instructions  Return to your normal activities as told by your health care provider. Ask your health care provider what activities are safe for you. Carry a medical alert card or wear a medical alert bracelet at all times. This will let health care providers know that you have an implanted port in case of an emergency. Where to find more information American Cancer Society: www.cancer.[place] [employer] On[employer]: www.cancer.net Contact a health care provider if: You have a fever or chills. You have redness, swelling, or pain at the port site. You have fluid or blood coming from your port site. Your incision feels warm to the touch. You have pus or a bad smell coming from the port site. Summary Implanted ports are usually placed in the chest for long-term IV access. Follow instructions from your health care provider about flushing the port and changing bandages (dressings). Take care of the area around your port by avoiding clothing that puts pressure on the area, and by watching for signs of infection. Protect the skin around your port from seat belts. Place a soft pad over your chest if needed. Contact a health care provider if you have a fever or you have redness, swelling, pain, fluid, or a bad smell at the port site. This information is not intended to replace advice given to you by your health care provider. Make sure you discuss any questions you have with your health care provider. Document Revised: 08/13/2020 Document Reviewed: 08/13/2020 Elsevier Patient Education  [place] Removal, Care After The following information offers guidance on how to care for yourself after your procedure. Your health care provider may also give you more specific instructions. If you have problems or questions, contact your health care provider. What can I expect after the procedure? After the procedure, it is common to have: Soreness or pain near your incision. Some swelling or bruising near your incision. Follow these instructions at home: Medicines Take over-the-counter and prescription medicines only as told by your health care provider. If you were prescribed an antibiotic medicine, take it as told by your health care provider. Do not stop taking the antibiotic even if you start to feel better. Bathing Do not take baths, swim, or use a hot tub until your health care provider approves. Ask your health care provider if you can take showers. You may only be allowed to take sponge baths. Incision care  Follow instructions from your health  care provider about how to take care of your incision. Make sure you: Wash your hands with soap and water for at least 20 seconds before and after you change your bandage (dressing). If soap and water are not available, use hand sanitizer. Change your dressing as told by your health care provider. Keep your dressing dry. Leave stitches (sutures), skin glue, or adhesive strips in place. These skin closures may need to stay in place for 2 weeks or longer. If adhesive strip edges start to loosen and curl up, you may trim the loose edges. Do not remove adhesive strips completely unless your health care provider tells you to do that. Check your incision area every day for signs of infection. Check for: More redness, swelling, or pain. More fluid or blood. Warmth. Pus or a bad smell. Activity Return to your normal activities as told by your health care provider. Ask your health care provider what activities are safe for you. You  may have to avoid lifting. Ask your health care provider how much you can safely lift. Do not do activities that involve lifting your arms over your head. Driving  If you were given a sedative during the procedure, it can affect you for several hours. Do not drive or operate machinery until your health care provider says that it is safe. If you did not receive a sedative, ask your health care provider when it is safe to drive. General instructions Do not use any products that contain nicotine or tobacco. These products include cigarettes, chewing tobacco, and vaping devices, such as e-cigarettes. These can delay healing after surgery. If you need help quitting, ask your health care provider. Keep all follow-up visits. This is important. Contact a health care provider if: You have a fever or chills. You have more redness, swelling, or pain around your incision. You have more fluid or blood coming from your incision. Your incision feels warm to the touch. You have pus or a bad smell coming from your incision. You have pain that is not relieved by your pain medicine. Get help right away if: You have chest pain. You have difficulty breathing. These symptoms may be an emergency. Get help right away. Call 911. Do not wait to see if the symptoms will go away. Do not drive yourself to the hospital. Summary After the procedure, it is common to have pain, soreness, swelling, or bruising near your incision. If you were prescribed an antibiotic medicine, take it as told by your health care provider. Do not stop taking the antibiotic even if you start to feel better. If you were given a sedative during the procedure, it can affect you for several hours. Do not drive or operate machinery until your health care provider says that it is safe. Return to your normal activities as told by your health care provider. Ask your health care provider what activities are safe for you. This information is not  intended to replace advice given to you by your health care provider. Make sure you discuss any questions you have with your health care provider. Document Revised: 08/13/2020 Document Reviewed: 08/13/2020 Elsevier Patient Education  [place].

## 2022-03-06 ENCOUNTER — Other Ambulatory Visit: Payer: Self-pay

## 2022-03-06 LAB — KAPPA/LAMBDA LIGHT CHAINS
Kappa free light chain: 47.6 mg/L — ABNORMAL HIGH (ref 3.3–19.4)
Kappa, lambda light chain ratio: 1.84 — ABNORMAL HIGH (ref 0.26–1.65)
Lambda free light chains: 25.8 mg/L (ref 5.7–26.3)

## 2022-03-06 LAB — IGG, IGA, IGM
IgA: 257 mg/dL (ref 61–437)
IgG (Immunoglobin G), Serum: 1056 mg/dL (ref 603–1613)
IgM (Immunoglobulin M), Srm: 39 mg/dL (ref 15–143)

## 2022-03-09 LAB — PROTEIN ELECTROPHORESIS, SERUM, WITH REFLEX
A/G Ratio: 1.2 (ref 0.7–1.7)
Albumin ELP: 3.6 g/dL (ref 2.9–4.4)
Alpha-1-Globulin: 0.2 g/dL (ref 0.0–0.4)
Alpha-2-Globulin: 0.9 g/dL (ref 0.4–1.0)
Beta Globulin: 0.9 g/dL (ref 0.7–1.3)
Gamma Globulin: 1.1 g/dL (ref 0.4–1.8)
Globulin, Total: 3.1 g/dL (ref 2.2–3.9)
Total Protein ELP: 6.7 g/dL (ref 6.0–8.5)

## 2022-03-10 ENCOUNTER — Other Ambulatory Visit: Payer: Self-pay

## 2022-03-13 ENCOUNTER — Encounter: Payer: Self-pay | Admitting: Podiatry

## 2022-03-13 ENCOUNTER — Ambulatory Visit (INDEPENDENT_AMBULATORY_CARE_PROVIDER_SITE_OTHER): Payer: 59 | Admitting: Podiatry

## 2022-03-13 ENCOUNTER — Other Ambulatory Visit: Payer: Self-pay

## 2022-03-13 DIAGNOSIS — L84 Corns and callosities: Secondary | ICD-10-CM

## 2022-03-13 DIAGNOSIS — B351 Tinea unguium: Secondary | ICD-10-CM | POA: Diagnosis not present

## 2022-03-13 DIAGNOSIS — M79675 Pain in left toe(s): Secondary | ICD-10-CM

## 2022-03-13 DIAGNOSIS — E1151 Type 2 diabetes mellitus with diabetic peripheral angiopathy without gangrene: Secondary | ICD-10-CM | POA: Diagnosis not present

## 2022-03-13 DIAGNOSIS — M79674 Pain in right toe(s): Secondary | ICD-10-CM

## 2022-03-13 NOTE — Progress Notes (Signed)
This patient returns to my office for at risk foot care.  This patient requires this care by a professional since this patient will be at risk due to having diabetes type 2.  This patient is unable to cut nails himself since the patient cannot reach his nails.These nails are painful walking and wearing shoes.  Painful callus under outside ball of both feet.This patient presents for at risk foot care today.  General Appearance  Alert, conversant and in no acute stress.  Vascular  Dorsalis pedis and posterior tibial  pulses are weakly  palpable  bilaterally.  Capillary return is within normal limits  bilaterally. Cold feet bilaterally.  Absent digital hair.  Neurologic  Senn-Weinstein monofilament wire test within normal limits/diminished right.  LOPS absent left foot. Muscle power within normal limits bilaterally.  Nails Thick disfigured discolored nails with subungual debris  from hallux to fifth toes bilaterally. No evidence of bacterial infection or drainage bilaterally.  Orthopedic  No limitations of motion  feet .  No crepitus or effusions noted.  No bony pathology or digital deformities noted.  HAV  B/L.  Exostosis right foot.Plantar flexed fifth metatarsall B/l.  Skin  normotropic skin with no porokeratosis noted bilaterally.  No signs of infections or ulcers noted.  Callus sub 5th met  B/l. No infection.  Onychomycosis  Pain in right toes  Pain in left toes  Porokeratosis  B/L  Consent was obtained for treatment procedures.   Mechanical debridement of nails 1-5  bilaterally performed with a nail nipper.  Filed with dremel without incident. Debride callus sub 5th  B/L with # 15 blade and dremel tool.   Return office visit    10 weeks                  Told patient to return for periodic foot care and evaluation due to potential at risk complications.   Gardiner Barefoot DPM

## 2022-03-15 ENCOUNTER — Other Ambulatory Visit: Payer: Self-pay

## 2022-03-19 ENCOUNTER — Inpatient Hospital Stay: Payer: 59

## 2022-03-19 ENCOUNTER — Inpatient Hospital Stay: Payer: 59 | Admitting: Hematology & Oncology

## 2022-03-19 VITALS — BP 126/57 | HR 75 | Resp 18

## 2022-03-19 DIAGNOSIS — C9000 Multiple myeloma not having achieved remission: Secondary | ICD-10-CM

## 2022-03-19 DIAGNOSIS — Z299 Encounter for prophylactic measures, unspecified: Secondary | ICD-10-CM

## 2022-03-19 DIAGNOSIS — E349 Endocrine disorder, unspecified: Secondary | ICD-10-CM

## 2022-03-19 DIAGNOSIS — N189 Chronic kidney disease, unspecified: Secondary | ICD-10-CM

## 2022-03-19 DIAGNOSIS — Z5112 Encounter for antineoplastic immunotherapy: Secondary | ICD-10-CM | POA: Diagnosis not present

## 2022-03-19 LAB — CBC WITH DIFFERENTIAL (CANCER CENTER ONLY)
Abs Immature Granulocytes: 0.07 10*3/uL (ref 0.00–0.07)
Basophils Absolute: 0.1 10*3/uL (ref 0.0–0.1)
Basophils Relative: 1 %
Eosinophils Absolute: 0.1 10*3/uL (ref 0.0–0.5)
Eosinophils Relative: 2 %
HCT: 30.9 % — ABNORMAL LOW (ref 39.0–52.0)
Hemoglobin: 9.6 g/dL — ABNORMAL LOW (ref 13.0–17.0)
Immature Granulocytes: 1 %
Lymphocytes Relative: 11 %
Lymphs Abs: 0.7 10*3/uL (ref 0.7–4.0)
MCH: 24.7 pg — ABNORMAL LOW (ref 26.0–34.0)
MCHC: 31.1 g/dL (ref 30.0–36.0)
MCV: 79.4 fL — ABNORMAL LOW (ref 80.0–100.0)
Monocytes Absolute: 0.8 10*3/uL (ref 0.1–1.0)
Monocytes Relative: 13 %
Neutro Abs: 4.2 10*3/uL (ref 1.7–7.7)
Neutrophils Relative %: 72 %
Platelet Count: 188 10*3/uL (ref 150–400)
RBC: 3.89 MIL/uL — ABNORMAL LOW (ref 4.22–5.81)
RDW: 17.5 % — ABNORMAL HIGH (ref 11.5–15.5)
WBC Count: 6 10*3/uL (ref 4.0–10.5)
nRBC: 0 % (ref 0.0–0.2)

## 2022-03-19 LAB — CMP (CANCER CENTER ONLY)
ALT: 18 U/L (ref 0–44)
AST: 15 U/L (ref 15–41)
Albumin: 3.8 g/dL (ref 3.5–5.0)
Alkaline Phosphatase: 66 U/L (ref 38–126)
Anion gap: 6 (ref 5–15)
BUN: 23 mg/dL (ref 8–23)
CO2: 25 mmol/L (ref 22–32)
Calcium: 8.8 mg/dL — ABNORMAL LOW (ref 8.9–10.3)
Chloride: 110 mmol/L (ref 98–111)
Creatinine: 2.09 mg/dL — ABNORMAL HIGH (ref 0.61–1.24)
GFR, Estimated: 33 mL/min — ABNORMAL LOW (ref >60)
Glucose, Bld: 95 mg/dL (ref 70–99)
Potassium: 4.6 mmol/L (ref 3.5–5.1)
Sodium: 141 mmol/L (ref 135–145)
Total Bilirubin: 0.5 mg/dL (ref 0.3–1.2)
Total Protein: 6.2 g/dL — ABNORMAL LOW (ref 6.5–8.1)

## 2022-03-19 MED ORDER — PALONOSETRON HCL INJECTION 0.25 MG/5ML
0.2500 mg | Freq: Once | INTRAVENOUS | Status: AC
  Administered 2022-03-19: 0.25 mg via INTRAVENOUS
  Filled 2022-03-19: qty 5

## 2022-03-19 MED ORDER — HEPARIN SOD (PORK) LOCK FLUSH 100 UNIT/ML IV SOLN
500.0000 [IU] | Freq: Once | INTRAVENOUS | Status: AC | PRN
  Administered 2022-03-19: 500 [IU]

## 2022-03-19 MED ORDER — HYDROMORPHONE HCL 1 MG/ML IJ SOLN
2.0000 mg | Freq: Once | INTRAMUSCULAR | Status: AC
  Administered 2022-03-19: 2 mg via INTRAVENOUS
  Filled 2022-03-19: qty 2

## 2022-03-19 MED ORDER — SODIUM CHLORIDE 0.9 % IV SOLN
300.0000 mg/m2 | Freq: Once | INTRAVENOUS | Status: AC
  Administered 2022-03-19: 580 mg via INTRAVENOUS
  Filled 2022-03-19: qty 29

## 2022-03-19 MED ORDER — EPOETIN ALFA-EPBX 40000 UNIT/ML IJ SOLN
40000.0000 [IU] | Freq: Once | INTRAMUSCULAR | Status: AC
  Administered 2022-03-19: 40000 [IU] via SUBCUTANEOUS
  Filled 2022-03-19: qty 1

## 2022-03-19 MED ORDER — SODIUM CHLORIDE 0.9 % IV SOLN: Freq: Once | INTRAVENOUS | Status: AC

## 2022-03-19 MED ORDER — SODIUM CHLORIDE 0.9% FLUSH
10.0000 mL | INTRAVENOUS | Status: DC | PRN
  Administered 2022-03-19: 10 mL

## 2022-03-19 MED ORDER — DEXTROSE 5 % IV SOLN
36.0000 mg/m2 | Freq: Once | INTRAVENOUS | Status: AC
  Administered 2022-03-19: 70 mg via INTRAVENOUS
  Filled 2022-03-19: qty 30

## 2022-03-19 MED ORDER — SODIUM CHLORIDE 0.9 % IV SOLN
10.0000 mg | Freq: Once | INTRAVENOUS | Status: AC
  Administered 2022-03-19: 10 mg via INTRAVENOUS
  Filled 2022-03-19: qty 10

## 2022-03-19 MED ORDER — CYANOCOBALAMIN 1000 MCG/ML IJ SOLN
1000.0000 ug | Freq: Once | INTRAMUSCULAR | Status: AC
  Administered 2022-03-19: 1000 ug via INTRAMUSCULAR
  Filled 2022-03-19: qty 1

## 2022-03-19 NOTE — Patient Instructions (Addendum)
[facility] [place]  Discharge Instructions: Thank you for choosing [facility] to provide your oncology and hematology care.   If you have a lab appointment with the [facility], please go directly to the [facility] and check in at the registration area.  Wear comfortable clothing and clothing appropriate for easy access to any Portacath or PICC line.   We strive to give you quality time with your provider. You may need to reschedule your appointment if you arrive late (15 or more minutes).  Arriving late affects you and other patients whose appointments are after yours.  Also, if you miss three or more appointments without notifying the office, you may be dismissed from the clinic at the provider's discretion.      For prescription refill requests, have your pharmacy contact our office and allow 72 hours for refills to be completed.    Today you received the following chemotherapy and/or immunotherapy agents Kyprolis, Cytoxan.      To help prevent nausea and vomiting after your treatment, we encourage you to take your nausea medication as directed.  BELOW ARE SYMPTOMS THAT SHOULD BE REPORTED IMMEDIATELY: *FEVER GREATER THAN 100.4 F (38 C) OR HIGHER *CHILLS OR SWEATING *NAUSEA AND VOMITING THAT IS NOT CONTROLLED WITH YOUR NAUSEA MEDICATION *UNUSUAL SHORTNESS OF BREATH *UNUSUAL BRUISING OR BLEEDING *URINARY PROBLEMS (pain or burning when urinating, or frequent urination) *BOWEL PROBLEMS (unusual diarrhea, constipation, pain near the anus) TENDERNESS IN MOUTH AND THROAT WITH OR WITHOUT PRESENCE OF ULCERS (sore throat, sores in mouth, or a toothache) UNUSUAL RASH, SWELLING OR PAIN  UNUSUAL VAGINAL DISCHARGE OR ITCHING   Items with * indicate a potential emergency and should be followed up as soon as possible or go to the Emergency Department if any problems should occur.  Please show the CHEMOTHERAPY ALERT CARD or IMMUNOTHERAPY ALERT CARD  at check-in to the Emergency Department and triage nurse. Should you have questions after your visit or need to cancel or reschedule your appointment, please contact [facility]  [phone] and follow the prompts.  Office hours are 8:00 a.m. to 4:30 p.m. Monday - Friday. Please note that voicemails left after 4:00 p.m. may not be returned until the following business day.  We are closed weekends and major holidays. You have access to a nurse at all times for urgent questions. Please call the main number to the clinic [phone] and follow the prompts.  For any non-urgent questions, you may also contact your provider using MyChart. We now offer e-Visits for anyone 24 and older to request care online for non-urgent symptoms. For details visit [email or web address].   Also download the MyChart app! Go to the app store, search "MyChart", open the app, select [facility], and log in with your MyChart username and password.

## 2022-03-19 NOTE — Progress Notes (Signed)
OK to treat with today's creatinine level from today per Dr. [name].

## 2022-04-02 ENCOUNTER — Ambulatory Visit: Payer: 59 | Admitting: Hematology & Oncology

## 2022-04-02 ENCOUNTER — Inpatient Hospital Stay: Payer: 59

## 2022-04-02 ENCOUNTER — Inpatient Hospital Stay: Payer: 59 | Attending: Hematology & Oncology

## 2022-04-02 DIAGNOSIS — Z5112 Encounter for antineoplastic immunotherapy: Secondary | ICD-10-CM | POA: Insufficient documentation

## 2022-04-02 DIAGNOSIS — Z79899 Other long term (current) drug therapy: Secondary | ICD-10-CM | POA: Diagnosis not present

## 2022-04-02 DIAGNOSIS — E119 Type 2 diabetes mellitus without complications: Secondary | ICD-10-CM | POA: Diagnosis not present

## 2022-04-02 DIAGNOSIS — C9000 Multiple myeloma not having achieved remission: Secondary | ICD-10-CM | POA: Diagnosis present

## 2022-04-02 DIAGNOSIS — E291 Testicular hypofunction: Secondary | ICD-10-CM | POA: Insufficient documentation

## 2022-04-02 DIAGNOSIS — Z79890 Hormone replacement therapy (postmenopausal): Secondary | ICD-10-CM | POA: Insufficient documentation

## 2022-04-02 DIAGNOSIS — Z5111 Encounter for antineoplastic chemotherapy: Secondary | ICD-10-CM | POA: Diagnosis present

## 2022-04-02 DIAGNOSIS — N184 Chronic kidney disease, stage 4 (severe): Secondary | ICD-10-CM | POA: Diagnosis not present

## 2022-04-02 DIAGNOSIS — D631 Anemia in chronic kidney disease: Secondary | ICD-10-CM | POA: Diagnosis not present

## 2022-04-02 DIAGNOSIS — Z299 Encounter for prophylactic measures, unspecified: Secondary | ICD-10-CM

## 2022-04-02 DIAGNOSIS — E349 Endocrine disorder, unspecified: Secondary | ICD-10-CM

## 2022-04-02 LAB — CBC WITH DIFFERENTIAL (CANCER CENTER ONLY)
Abs Immature Granulocytes: 0.02 10*3/uL (ref 0.00–0.07)
Basophils Absolute: 0.1 10*3/uL (ref 0.0–0.1)
Basophils Relative: 1 %
Eosinophils Absolute: 0.1 10*3/uL (ref 0.0–0.5)
Eosinophils Relative: 3 %
HCT: 33.3 % — ABNORMAL LOW (ref 39.0–52.0)
Hemoglobin: 10.4 g/dL — ABNORMAL LOW (ref 13.0–17.0)
Immature Granulocytes: 1 %
Lymphocytes Relative: 14 %
Lymphs Abs: 0.6 10*3/uL — ABNORMAL LOW (ref 0.7–4.0)
MCH: 25.1 pg — ABNORMAL LOW (ref 26.0–34.0)
MCHC: 31.2 g/dL (ref 30.0–36.0)
MCV: 80.2 fL (ref 80.0–100.0)
Monocytes Absolute: 0.4 10*3/uL (ref 0.1–1.0)
Monocytes Relative: 11 %
Neutro Abs: 3 10*3/uL (ref 1.7–7.7)
Neutrophils Relative %: 70 %
Platelet Count: 176 10*3/uL (ref 150–400)
RBC: 4.15 MIL/uL — ABNORMAL LOW (ref 4.22–5.81)
RDW: 18.5 % — ABNORMAL HIGH (ref 11.5–15.5)
WBC Count: 4.2 10*3/uL (ref 4.0–10.5)
nRBC: 0 % (ref 0.0–0.2)

## 2022-04-02 LAB — CMP (CANCER CENTER ONLY)
ALT: 14 U/L (ref 0–44)
AST: 16 U/L (ref 15–41)
Albumin: 3.9 g/dL (ref 3.5–5.0)
Alkaline Phosphatase: 68 U/L (ref 38–126)
Anion gap: 7 (ref 5–15)
BUN: 20 mg/dL (ref 8–23)
CO2: 27 mmol/L (ref 22–32)
Calcium: 8.9 mg/dL (ref 8.9–10.3)
Chloride: 104 mmol/L (ref 98–111)
Creatinine: 1.96 mg/dL — ABNORMAL HIGH (ref 0.61–1.24)
GFR, Estimated: 36 mL/min — ABNORMAL LOW (ref >60)
Glucose, Bld: 143 mg/dL — ABNORMAL HIGH (ref 70–99)
Potassium: 5 mmol/L (ref 3.5–5.1)
Sodium: 138 mmol/L (ref 135–145)
Total Bilirubin: 0.5 mg/dL (ref 0.3–1.2)
Total Protein: 6.5 g/dL (ref 6.5–8.1)

## 2022-04-02 MED ORDER — AMOXICILLIN 500 MG PO CAPS: 500.0000 mg | ORAL_CAPSULE | Freq: Three times a day (TID) | ORAL | 0 refills | Status: DC

## 2022-04-02 MED ORDER — DEXTROSE 5 % IV SOLN
36.0000 mg/m2 | Freq: Once | INTRAVENOUS | Status: AC
  Administered 2022-04-02: 70 mg via INTRAVENOUS
  Filled 2022-04-02: qty 30

## 2022-04-02 MED ORDER — CYANOCOBALAMIN 1000 MCG/ML IJ SOLN
1000.0000 ug | Freq: Once | INTRAMUSCULAR | Status: AC
  Administered 2022-04-02: 1000 ug via INTRAMUSCULAR
  Filled 2022-04-02: qty 1

## 2022-04-02 MED ORDER — SODIUM CHLORIDE 0.9% FLUSH
10.0000 mL | INTRAVENOUS | Status: DC | PRN
  Administered 2022-04-02: 10 mL

## 2022-04-02 MED ORDER — SODIUM CHLORIDE 0.9 % IV SOLN
300.0000 mg/m2 | Freq: Once | INTRAVENOUS | Status: AC
  Administered 2022-04-02: 580 mg via INTRAVENOUS
  Filled 2022-04-02: qty 29

## 2022-04-02 MED ORDER — SODIUM CHLORIDE 0.9 % IV SOLN: Freq: Once | INTRAVENOUS | Status: AC

## 2022-04-02 MED ORDER — HYDROMORPHONE HCL 1 MG/ML IJ SOLN
2.0000 mg | Freq: Once | INTRAMUSCULAR | Status: AC
  Administered 2022-04-02: 2 mg via INTRAVENOUS
  Filled 2022-04-02: qty 2

## 2022-04-02 MED ORDER — PALONOSETRON HCL INJECTION 0.25 MG/5ML
0.2500 mg | Freq: Once | INTRAVENOUS | Status: AC
  Administered 2022-04-02: 0.25 mg via INTRAVENOUS
  Filled 2022-04-02: qty 5

## 2022-04-02 MED ORDER — SODIUM CHLORIDE 0.9 % IV SOLN
10.0000 mg | Freq: Once | INTRAVENOUS | Status: AC
  Administered 2022-04-02: 10 mg via INTRAVENOUS
  Filled 2022-04-02: qty 10

## 2022-04-02 MED ORDER — TESTOSTERONE CYPIONATE 200 MG/ML IM SOLN
400.0000 mg | Freq: Once | INTRAMUSCULAR | Status: AC
  Administered 2022-04-02: 400 mg via INTRAMUSCULAR
  Filled 2022-04-02: qty 2

## 2022-04-02 MED ORDER — HEPARIN SOD (PORK) LOCK FLUSH 100 UNIT/ML IV SOLN
500.0000 [IU] | Freq: Once | INTRAVENOUS | Status: AC | PRN
  Administered 2022-04-02: 500 [IU]

## 2022-04-02 NOTE — Progress Notes (Signed)
Per dr [name], okay to treat today despite labs.

## 2022-04-02 NOTE — Patient Instructions (Signed)

## 2022-04-02 NOTE — Addendum Note (Signed)
Addended by: Burney Gauze R on: 04/02/2022 01:29 PM   Modules accepted: Orders

## 2022-04-02 NOTE — Patient Instructions (Signed)
[facility] [place]  Discharge Instructions: Thank you for choosing [facility] to provide your oncology and hematology care.   If you have a lab appointment with the [facility], please go directly to the [facility] and check in at the registration area.  Wear comfortable clothing and clothing appropriate for easy access to any Portacath or PICC line.   We strive to give you quality time with your provider. You may need to reschedule your appointment if you arrive late (15 or more minutes).  Arriving late affects you and other patients whose appointments are after yours.  Also, if you miss three or more appointments without notifying the office, you may be dismissed from the clinic at the provider's discretion.      For prescription refill requests, have your pharmacy contact our office and allow 72 hours for refills to be completed.    Today you received the following chemotherapy and/or immunotherapy agents  KYPROLIS  CYTOXAN tESTOSTERONE, b.12   To help prevent nausea and vomiting after your treatment, we encourage you to take your nausea medication as directed.  BELOW ARE SYMPTOMS THAT SHOULD BE REPORTED IMMEDIATELY: *FEVER GREATER THAN 100.4 F (38 C) OR HIGHER *CHILLS OR SWEATING *NAUSEA AND VOMITING THAT IS NOT CONTROLLED WITH YOUR NAUSEA MEDICATION *UNUSUAL SHORTNESS OF BREATH *UNUSUAL BRUISING OR BLEEDING *URINARY PROBLEMS (pain or burning when urinating, or frequent urination) *BOWEL PROBLEMS (unusual diarrhea, constipation, pain near the anus) TENDERNESS IN MOUTH AND THROAT WITH OR WITHOUT PRESENCE OF ULCERS (sore throat, sores in mouth, or a toothache) UNUSUAL RASH, SWELLING OR PAIN  UNUSUAL VAGINAL DISCHARGE OR ITCHING   Items with * indicate a potential emergency and should be followed up as soon as possible or go to the Emergency Department if any problems should occur.  Please show the CHEMOTHERAPY ALERT CARD or  IMMUNOTHERAPY ALERT CARD at check-in to the Emergency Department and triage nurse. Should you have questions after your visit or need to cancel or reschedule your appointment, please contact [facility]  [phone] and follow the prompts.  Office hours are 8:00 a.m. to 4:30 p.m. Monday - Friday. Please note that voicemails left after 4:00 p.m. may not be returned until the following business day.  We are closed weekends and major holidays. You have access to a nurse at all times for urgent questions. Please call the main number to the clinic [phone] and follow the prompts.  For any non-urgent questions, you may also contact your provider using MyChart. We now offer e-Visits for anyone 19 and older to request care online for non-urgent symptoms. For details visit [email or web address].   Also download the MyChart app! Go to the app store, search "MyChart", open the app, select [facility], and log in with your MyChart username and password.

## 2022-04-16 ENCOUNTER — Inpatient Hospital Stay: Payer: 59

## 2022-04-16 ENCOUNTER — Other Ambulatory Visit: Payer: Self-pay

## 2022-04-16 ENCOUNTER — Inpatient Hospital Stay (HOSPITAL_BASED_OUTPATIENT_CLINIC_OR_DEPARTMENT_OTHER): Payer: 59 | Admitting: Hematology & Oncology

## 2022-04-16 ENCOUNTER — Encounter: Payer: Self-pay | Admitting: Hematology & Oncology

## 2022-04-16 VITALS — BP 139/64 | HR 78 | Temp 98.6°F | Resp 18 | Ht 71.0 in | Wt 166.0 lb

## 2022-04-16 DIAGNOSIS — E349 Endocrine disorder, unspecified: Secondary | ICD-10-CM

## 2022-04-16 DIAGNOSIS — Z299 Encounter for prophylactic measures, unspecified: Secondary | ICD-10-CM

## 2022-04-16 DIAGNOSIS — C9000 Multiple myeloma not having achieved remission: Secondary | ICD-10-CM

## 2022-04-16 DIAGNOSIS — D631 Anemia in chronic kidney disease: Secondary | ICD-10-CM

## 2022-04-16 DIAGNOSIS — Z5112 Encounter for antineoplastic immunotherapy: Secondary | ICD-10-CM | POA: Diagnosis not present

## 2022-04-16 LAB — CMP (CANCER CENTER ONLY)
ALT: 16 U/L (ref 0–44)
AST: 16 U/L (ref 15–41)
Albumin: 3.9 g/dL (ref 3.5–5.0)
Alkaline Phosphatase: 72 U/L (ref 38–126)
Anion gap: 9 (ref 5–15)
BUN: 28 mg/dL — ABNORMAL HIGH (ref 8–23)
CO2: 26 mmol/L (ref 22–32)
Calcium: 8.5 mg/dL — ABNORMAL LOW (ref 8.9–10.3)
Chloride: 106 mmol/L (ref 98–111)
Creatinine: 2.46 mg/dL — ABNORMAL HIGH (ref 0.61–1.24)
GFR, Estimated: 27 mL/min — ABNORMAL LOW (ref >60)
Glucose, Bld: 190 mg/dL — ABNORMAL HIGH (ref 70–99)
Potassium: 3.8 mmol/L (ref 3.5–5.1)
Sodium: 141 mmol/L (ref 135–145)
Total Bilirubin: 0.4 mg/dL (ref 0.3–1.2)
Total Protein: 6.2 g/dL — ABNORMAL LOW (ref 6.5–8.1)

## 2022-04-16 LAB — CBC WITH DIFFERENTIAL (CANCER CENTER ONLY)
Abs Immature Granulocytes: 0.04 10*3/uL (ref 0.00–0.07)
Basophils Absolute: 0.1 10*3/uL (ref 0.0–0.1)
Basophils Relative: 1 %
Eosinophils Absolute: 0.1 10*3/uL (ref 0.0–0.5)
Eosinophils Relative: 2 %
HCT: 31.4 % — ABNORMAL LOW (ref 39.0–52.0)
Hemoglobin: 9.8 g/dL — ABNORMAL LOW (ref 13.0–17.0)
Immature Granulocytes: 1 %
Lymphocytes Relative: 16 %
Lymphs Abs: 0.8 10*3/uL (ref 0.7–4.0)
MCH: 25.1 pg — ABNORMAL LOW (ref 26.0–34.0)
MCHC: 31.2 g/dL (ref 30.0–36.0)
MCV: 80.3 fL (ref 80.0–100.0)
Monocytes Absolute: 0.7 10*3/uL (ref 0.1–1.0)
Monocytes Relative: 14 %
Neutro Abs: 3.4 10*3/uL (ref 1.7–7.7)
Neutrophils Relative %: 66 %
Platelet Count: 193 10*3/uL (ref 150–400)
RBC: 3.91 MIL/uL — ABNORMAL LOW (ref 4.22–5.81)
RDW: 18.6 % — ABNORMAL HIGH (ref 11.5–15.5)
WBC Count: 5.1 10*3/uL (ref 4.0–10.5)
nRBC: 0 % (ref 0.0–0.2)

## 2022-04-16 LAB — IRON AND IRON BINDING CAPACITY (CC-WL,HP ONLY)
Iron: 59 ug/dL (ref 45–182)
Saturation Ratios: 27 % (ref 17.9–39.5)
TIBC: 220 ug/dL — ABNORMAL LOW (ref 250–450)
UIBC: 161 ug/dL (ref 117–376)

## 2022-04-16 LAB — RETICULOCYTES
Immature Retic Fract: 16.4 % — ABNORMAL HIGH (ref 2.3–15.9)
RBC.: 3.87 MIL/uL — ABNORMAL LOW (ref 4.22–5.81)
Retic Count, Absolute: 34.4 10*3/uL (ref 19.0–186.0)
Retic Ct Pct: 0.9 % (ref 0.4–3.1)

## 2022-04-16 LAB — LACTATE DEHYDROGENASE: LDH: 183 U/L (ref 98–192)

## 2022-04-16 LAB — FERRITIN: Ferritin: 1157 ng/mL — ABNORMAL HIGH (ref 24–336)

## 2022-04-16 MED ORDER — SODIUM CHLORIDE 0.9% FLUSH
10.0000 mL | Freq: Once | INTRAVENOUS | Status: AC
  Administered 2022-04-16: 10 mL

## 2022-04-16 MED ORDER — SODIUM CHLORIDE 0.9 % IV SOLN
10.0000 mg | Freq: Once | INTRAVENOUS | Status: AC
  Administered 2022-04-16: 10 mg via INTRAVENOUS
  Filled 2022-04-16: qty 10

## 2022-04-16 MED ORDER — PALONOSETRON HCL INJECTION 0.25 MG/5ML
0.2500 mg | Freq: Once | INTRAVENOUS | Status: AC
  Administered 2022-04-16: 0.25 mg via INTRAVENOUS
  Filled 2022-04-16: qty 5

## 2022-04-16 MED ORDER — DEXTROSE 5 % IV SOLN
36.0000 mg/m2 | Freq: Once | INTRAVENOUS | Status: AC
  Administered 2022-04-16: 70 mg via INTRAVENOUS
  Filled 2022-04-16: qty 30

## 2022-04-16 MED ORDER — EPOETIN ALFA-EPBX 40000 UNIT/ML IJ SOLN
40000.0000 [IU] | Freq: Once | INTRAMUSCULAR | Status: AC
  Administered 2022-04-16: 40000 [IU] via SUBCUTANEOUS
  Filled 2022-04-16: qty 1

## 2022-04-16 MED ORDER — HYDROMORPHONE HCL 1 MG/ML IJ SOLN
2.0000 mg | Freq: Once | INTRAMUSCULAR | Status: AC
  Administered 2022-04-16: 2 mg via INTRAVENOUS
  Filled 2022-04-16: qty 2

## 2022-04-16 MED ORDER — SODIUM CHLORIDE 0.9 % IV SOLN: Freq: Once | INTRAVENOUS | Status: AC

## 2022-04-16 MED ORDER — SODIUM CHLORIDE 0.9% FLUSH
10.0000 mL | INTRAVENOUS | Status: DC | PRN
  Administered 2022-04-16: 10 mL

## 2022-04-16 MED ORDER — CYANOCOBALAMIN 1000 MCG/ML IJ SOLN
1000.0000 ug | Freq: Once | INTRAMUSCULAR | Status: AC
  Administered 2022-04-16: 1000 ug via INTRAMUSCULAR
  Filled 2022-04-16: qty 1

## 2022-04-16 MED ORDER — HEPARIN SOD (PORK) LOCK FLUSH 100 UNIT/ML IV SOLN
500.0000 [IU] | Freq: Once | INTRAVENOUS | Status: AC | PRN
  Administered 2022-04-16: 500 [IU]

## 2022-04-16 MED ORDER — SODIUM CHLORIDE 0.9 % IV SOLN
300.0000 mg/m2 | Freq: Once | INTRAVENOUS | Status: AC
  Administered 2022-04-16: 580 mg via INTRAVENOUS
  Filled 2022-04-16: qty 29

## 2022-04-16 NOTE — Patient Instructions (Addendum)
[facility] [place]  Discharge Instructions: Thank you for choosing [facility] to provide your oncology and hematology care.   If you have a lab appointment with the [facility], please go directly to the [facility] and check in at the registration area.  Wear comfortable clothing and clothing appropriate for easy access to any Portacath or PICC line.   We strive to give you quality time with your provider. You may need to reschedule your appointment if you arrive late (15 or more minutes).  Arriving late affects you and other patients whose appointments are after yours.  Also, if you miss three or more appointments without notifying the office, you may be dismissed from the clinic at the provider's discretion.      For prescription refill requests, have your pharmacy contact our office and allow 72 hours for refills to be completed.    Today you received the following chemotherapy and/or immunotherapy agents xgeva, B-12, ctx,, kyprolis, dilaudid To help prevent nausea and vomiting after your treatment, we encourage you to take your nausea medication as directed.  BELOW ARE SYMPTOMS THAT SHOULD BE REPORTED IMMEDIATELY: *FEVER GREATER THAN 100.4 F (38 C) OR HIGHER *CHILLS OR SWEATING *NAUSEA AND VOMITING THAT IS NOT CONTROLLED WITH YOUR NAUSEA MEDICATION *UNUSUAL SHORTNESS OF BREATH *UNUSUAL BRUISING OR BLEEDING *URINARY PROBLEMS (pain or burning when urinating, or frequent urination) *BOWEL PROBLEMS (unusual diarrhea, constipation, pain near the anus) TENDERNESS IN MOUTH AND THROAT WITH OR WITHOUT PRESENCE OF ULCERS (sore throat, sores in mouth, or a toothache) UNUSUAL RASH, SWELLING OR PAIN  UNUSUAL VAGINAL DISCHARGE OR ITCHING   Items with * indicate a potential emergency and should be followed up as soon as possible or go to the Emergency Department if any problems should occur.  Please show the CHEMOTHERAPY ALERT CARD or IMMUNOTHERAPY  ALERT CARD at check-in to the Emergency Department and triage nurse. Should you have questions after your visit or need to cancel or reschedule your appointment, please contact [facility]  [phone] and follow the prompts.  Office hours are 8:00 a.m. to 4:30 p.m. Monday - Friday. Please note that voicemails left after 4:00 p.m. may not be returned until the following business day.  We are closed weekends and major holidays. You have access to a nurse at all times for urgent questions. Please call the main number to the clinic [phone] and follow the prompts.  For any non-urgent questions, you may also contact your provider using MyChart. We now offer e-Visits for anyone 20 and older to request care online for non-urgent symptoms. For details visit [email or web address].   Also download the MyChart app! Go to the app store, search "MyChart", open the app, select [facility], and log in with your MyChart username and password.

## 2022-04-16 NOTE — Patient Instructions (Signed)

## 2022-04-16 NOTE — Progress Notes (Signed)
Okay to treat today with SCr 2.46 per Dr. [name].

## 2022-04-16 NOTE — Progress Notes (Signed)
Hematology and Oncology Follow Up Visit  [name] [id number] Sep 19, 1950 72 y.o. 04/16/2022   Principle Diagnosis:  IgG kappa myeloma Anemia secondary to renal insufficiency Intermittent iron - deficiency anemia Hypotestosteronemia   Current Therapy:        Aredia 60 mg IV q  month  -- on hold starting 03/05/2022 -- needs tooth extrated --tooth extracted 03/2022 Retacrit SQ as needed for hemoglobin less than 10 DepoTestosterone 400 mg q 4 weeks IV iron as indicated Kyprolis/Cytoxan - s/p cycle #36   Interim History:  [name] is here today for follow-up and treatment.  He says that he feels better.  I think the warm weather is helping.  He finally had his tooth taken out.  This was a molar in the left mandibular area.  He had this done a couple weeks ago.  As such, we probably cannot give him Aredia until at least May.  He has had better appetite.  He has had no problems with nausea or vomiting.  He has had no change in bowel or bladder habits.  He has had no leg swelling.  Diabetes is still on the higher side.  He is trying to watch this.  When we last saw him, there is no monoclonal spike in his blood.  His IgG level was 1056 mg/dL.  The Kappa light chain was 4.8 mg/dL.  He has had no fever.  Overall, I would say that his performance status is probably ECOG 1.    Medications:  Allergies as of 04/16/2022       Reactions   Iodinated Contrast Media Rash   Patient states he was instructed not to take IV contrast.  In 2008 he had an unknown reaction, and was told not to take it again.  He was also told not to take it due to his kidneys.   Iodine Anxiety, Rash, Other (See Comments)   Didn't feel right "instructed not to take per MD--something with his port"        Medication List        Accurate as of April 16, 2022  8:46 AM. If you have any questions, ask your nurse or doctor.          STOP taking these medications    amoxicillin 500 MG  capsule Commonly known as: AMOXIL Stopped by: Adrian Napoleon, MD   Comirnaty syringe Generic drug: COVID-19 mRNA vaccine 2023-2024 Stopped by: Adrian Napoleon, MD   penicillin v potassium 500 MG tablet Commonly known as: VEETID Stopped by: Adrian Napoleon, MD       TAKE these medications    amLODipine 10 MG tablet Commonly known as: NORVASC Take 10 mg by mouth daily.   ammonium lactate 12 % lotion Commonly known as: AmLactin Apply 1 application topically as needed for dry skin.   atorvastatin 40 MG tablet Commonly known as: LIPITOR Take 40 mg by mouth daily at 6 PM.   budesonide-formoterol 160-4.5 MCG/ACT inhaler Commonly known as: SYMBICORT SMARTSIG:2 Puff(s) By Mouth Twice Daily   Carafate 1 GM/10ML suspension Generic drug: sucralfate Take 1 g by mouth 4 (four) times daily.   carvedilol 25 MG tablet Commonly known as: COREG Take 25 mg by mouth 2 (two) times daily.   chlorpheniramine-HYDROcodone 10-8 MG/5ML Commonly known as: TUSSIONEX SMARTSIG:5 Milliliter(s) By Mouth Every 12 Hours   [employer] Fiber Gummies 2 g Chew Chew by mouth daily.   diazepam 5 MG tablet Commonly known as: VALIUM Take by mouth at bedtime  as needed.   dicyclomine 10 MG capsule Commonly known as: BENTYL TAKE 1 CAPSULE BY MOUTH 3 TIMES A DAY BEFORE MEALS   dronabinol 5 MG capsule Commonly known as: MARINOL Take 1 capsule (5 mg total) by mouth 2 (two) times daily before a meal.   gabapentin 300 MG capsule Commonly known as: NEURONTIN TAKE 1 CAPSULE BY MOUTH THREE TIMES A DAY What changed:  how much to take how to take this when to take this additional instructions   glimepiride 2 MG tablet Commonly known as: AMARYL Take 2 mg by mouth daily.   hydrocortisone 2.5 % cream Apply topically 2 (two) times daily.   lactulose 10 GM/15ML solution Commonly known as: CHRONULAC Take 30 mLs (20 g total) by mouth daily.   latanoprost 0.005 % ophthalmic solution Commonly known as:  XALATAN Place 1 drop into both eyes at bedtime.   linaclotide 145 MCG Caps capsule Commonly known as: LINZESS Take by mouth daily as needed.   losartan 50 MG tablet Commonly known as: COZAAR Take 50 mg by mouth daily.   Lumigan 0.01 % Soln Generic drug: bimatoprost Place 1 drop into both eyes 3 (three) times daily.   mesalamine 1.2 g EC tablet Commonly known as: LIALDA Take by mouth daily with breakfast.   metoprolol succinate 25 MG 24 hr tablet Commonly known as: TOPROL-XL Take 25 mg by mouth daily.   mirabegron ER 25 MG Tb24 tablet Commonly known as: MYRBETRIQ Take 20 mg by mouth daily.   morphine 30 MG 12 hr tablet Commonly known as: MS CONTIN Take 30 mg by mouth 2 (two) times daily.   omeprazole 40 MG capsule Commonly known as: PRILOSEC TAKE 1 CAPSULE BY MOUTH TWICE A DAY   oxyCODONE 5 MG immediate release tablet Commonly known as: Roxicodone 1 tab PO q6 hours prn pain   oxyCODONE-acetaminophen 10-325 MG tablet Commonly known as: PERCOCET Take 1 tablet by mouth every 6 (six) hours as needed for pain.   pioglitazone 30 MG tablet Commonly known as: ACTOS Take 30 mg by mouth daily.   polyethylene glycol powder 17 GM/SCOOP powder Commonly known as: GLYCOLAX/MIRALAX SMARTSIG:1 scoopful By Mouth Twice Daily PRN   prochlorperazine 10 MG tablet Commonly known as: COMPAZINE take 1 tablet by mouth every 6 hours if needed for nausea and vomiting   Restasis 0.05 % ophthalmic emulsion Generic drug: cycloSPORINE Place 1 drop into both eyes daily.   SUMAtriptan 100 MG tablet Commonly known as: IMITREX Take 100 mg by mouth daily.   tadalafil 10 MG tablet Commonly known as: CIALIS Take by mouth.   topiramate 25 MG capsule Commonly known as: TOPAMAX Take 25 mg by mouth 2 (two) times daily.   topiramate 50 MG tablet Commonly known as: TOPAMAX Take 50 mg by mouth at bedtime.   torsemide 20 MG tablet Commonly known as: DEMADEX Take 20 mg by mouth daily.    Trulicity [id number] Sopn Generic drug: Dulaglutide Inject 0.75 mg into the skin every Saturday.   Vitamin D (Ergocalciferol) 1.25 MG (50000 UNIT) Caps capsule Commonly known as: DRISDOL Take 50,000 Units by mouth every Saturday.        Allergies:  Allergies  Allergen Reactions   Iodinated Contrast Media Rash    Patient states he was instructed not to take IV contrast.  In 2008 he had an unknown reaction, and was told not to take it again.  He was also told not to take it due to his kidneys.   Iodine  Anxiety, Rash and Other (See Comments)    Didn't feel right "instructed not to take per MD--something with his port"     Past Medical History, Surgical history, Social history, and Family History were reviewed and updated.  Review of Systems: Review of Systems  Constitutional:  Positive for malaise/fatigue.  HENT: Negative.    Eyes: Negative.   Respiratory: Negative.    Cardiovascular: Negative.   Gastrointestinal:  Positive for nausea.  Genitourinary: Negative.   Musculoskeletal:  Positive for joint pain and myalgias.  Skin: Negative.   Neurological: Negative.   Endo/Heme/Allergies: Negative.   Psychiatric/Behavioral: Negative.       Physical Exam:  height is 5' 11"$  (1.803 m) and weight is 166 lb (75.3 kg). His oral temperature is 98.6 F (37 C). His blood pressure is 139/64 and his pulse is 78. His respiration is 18 and oxygen saturation is 100%.   Wt Readings from Last 3 Encounters:  04/16/22 166 lb (75.3 kg)  03/05/22 167 lb (75.8 kg)  01/29/22 168 lb 1.9 oz (76.3 kg)   Physical Exam Vitals reviewed.  HENT:     Head: Normocephalic and atraumatic.  Eyes:     Pupils: Pupils are equal, round, and reactive to light.  Cardiovascular:     Rate and Rhythm: Normal rate and regular rhythm.     Heart sounds: Normal heart sounds.  Pulmonary:     Effort: Pulmonary effort is normal.     Breath sounds: Normal breath sounds.  Abdominal:     General: Bowel sounds  are normal.     Palpations: Abdomen is soft.  Musculoskeletal:        General: No tenderness or deformity. Normal range of motion.     Cervical back: Normal range of motion.  Lymphadenopathy:     Cervical: No cervical adenopathy.  Skin:    General: Skin is warm and dry.     Findings: No erythema or rash.  Neurological:     Mental Status: He is alert and oriented to person, place, and time.  Psychiatric:        Behavior: Behavior normal.        Thought Content: Thought content normal.        Judgment: Judgment normal.       Lab Results  Component Value Date   WBC 5.1 04/16/2022   HGB 9.8 (L) 04/16/2022   HCT 31.4 (L) 04/16/2022   MCV 80.3 04/16/2022   PLT 193 04/16/2022   Lab Results  Component Value Date   FERRITIN 1,066 (H) 01/29/2022   IRON 77 01/29/2022   TIBC 218 (L) 01/29/2022   UIBC 141 01/29/2022   IRONPCTSAT 35 01/29/2022   Lab Results  Component Value Date   RETICCTPCT 0.9 04/16/2022   RBC 3.91 (L) 04/16/2022   RBC 3.87 (L) 04/16/2022   RETICCTABS 27.5 03/21/2014   Lab Results  Component Value Date   KPAFRELGTCHN 47.6 (H) 03/05/2022   LAMBDASER 25.8 03/05/2022   KAPLAMBRATIO 1.84 (H) 03/05/2022   Lab Results  Component Value Date   IGGSERUM 1,056 03/05/2022   IGA 257 03/05/2022   IGMSERUM 39 03/05/2022   Lab Results  Component Value Date   TOTALPROTELP 6.7 03/05/2022   ALBUMINELP 3.6 03/05/2022   A1GS 0.2 03/05/2022   A2GS 0.9 03/05/2022   BETS 0.9 03/05/2022   BETA2SER 0.5 11/07/2014   GAMS 1.1 03/05/2022   MSPIKE Not Observed 03/05/2022   SPEI Comment 01/29/2022     Chemistry  Component Value Date/Time   NA 141 04/16/2022 0750   NA 143 02/22/2017 0744   K 3.8 04/16/2022 0750   K 4.0 02/22/2017 0744   CL 106 04/16/2022 0750   CL 106 02/22/2017 0744   CO2 26 04/16/2022 0750   CO2 27 02/22/2017 0744   BUN 28 (H) 04/16/2022 0750   BUN 26 (H) 02/22/2017 0744   CREATININE 2.46 (H) 04/16/2022 0750   CREATININE 2.3 (H) 02/22/2017  0744      Component Value Date/Time   CALCIUM 8.5 (L) 04/16/2022 0750   CALCIUM 9.3 02/22/2017 0744   ALKPHOS 72 04/16/2022 0750   ALKPHOS 80 02/22/2017 0744   AST 16 04/16/2022 0750   ALT 16 04/16/2022 0750   ALT 22 02/22/2017 0744   BILITOT 0.4 04/16/2022 0750       Impression and Plan: [name] is a very pleasant 72 yo African American gentleman with IgG kappa myeloma.  We have been doing this for many years.  So far, he is done incredibly well.  He is on a very good treatment protocol that he is tolerating well.  I am just glad that his quality of life is doing so well right now.  Again, we will hold off on the Aredia for at least 3 months.  He will need his Aranesp today.  Again, he does get his testosterone.  I know that this helps his quality of life.  We will go ahead and plan to get him back to see me in 6 more weeks.  He comes in every 2 weeks for treatment.    Adrian Napoleon, MD 2/22/20248:46 AM

## 2022-04-17 LAB — KAPPA/LAMBDA LIGHT CHAINS
Kappa free light chain: 37.1 mg/L — ABNORMAL HIGH (ref 3.3–19.4)
Kappa, lambda light chain ratio: 1.6 (ref 0.26–1.65)
Lambda free light chains: 23.2 mg/L (ref 5.7–26.3)

## 2022-04-17 LAB — TESTOSTERONE: Testosterone: 1005 ng/dL — ABNORMAL HIGH (ref 264–916)

## 2022-04-18 LAB — IGG, IGA, IGM
IgA: 181 mg/dL (ref 61–437)
IgG (Immunoglobin G), Serum: 785 mg/dL (ref 603–1613)
IgM (Immunoglobulin M), Srm: 28 mg/dL (ref 15–143)

## 2022-04-30 ENCOUNTER — Inpatient Hospital Stay: Payer: 59

## 2022-04-30 ENCOUNTER — Inpatient Hospital Stay: Payer: 59 | Attending: Hematology & Oncology

## 2022-04-30 VITALS — BP 155/74 | HR 77 | Temp 98.2°F | Resp 16

## 2022-04-30 DIAGNOSIS — C9000 Multiple myeloma not having achieved remission: Secondary | ICD-10-CM

## 2022-04-30 DIAGNOSIS — E291 Testicular hypofunction: Secondary | ICD-10-CM | POA: Insufficient documentation

## 2022-04-30 DIAGNOSIS — Z5112 Encounter for antineoplastic immunotherapy: Secondary | ICD-10-CM | POA: Diagnosis present

## 2022-04-30 DIAGNOSIS — Z79890 Hormone replacement therapy (postmenopausal): Secondary | ICD-10-CM | POA: Insufficient documentation

## 2022-04-30 DIAGNOSIS — D631 Anemia in chronic kidney disease: Secondary | ICD-10-CM

## 2022-04-30 DIAGNOSIS — Z299 Encounter for prophylactic measures, unspecified: Secondary | ICD-10-CM

## 2022-04-30 DIAGNOSIS — Z5111 Encounter for antineoplastic chemotherapy: Secondary | ICD-10-CM | POA: Diagnosis present

## 2022-04-30 DIAGNOSIS — E349 Endocrine disorder, unspecified: Secondary | ICD-10-CM

## 2022-04-30 LAB — CMP (CANCER CENTER ONLY)
ALT: 23 U/L (ref 0–44)
AST: 21 U/L (ref 15–41)
Albumin: 4 g/dL (ref 3.5–5.0)
Alkaline Phosphatase: 76 U/L (ref 38–126)
Anion gap: 7 (ref 5–15)
BUN: 23 mg/dL (ref 8–23)
CO2: 24 mmol/L (ref 22–32)
Calcium: 8.7 mg/dL — ABNORMAL LOW (ref 8.9–10.3)
Chloride: 109 mmol/L (ref 98–111)
Creatinine: 2.2 mg/dL — ABNORMAL HIGH (ref 0.61–1.24)
GFR, Estimated: 31 mL/min — ABNORMAL LOW (ref >60)
Glucose, Bld: 96 mg/dL (ref 70–99)
Potassium: 4.8 mmol/L (ref 3.5–5.1)
Sodium: 140 mmol/L (ref 135–145)
Total Bilirubin: 0.5 mg/dL (ref 0.3–1.2)
Total Protein: 6.5 g/dL (ref 6.5–8.1)

## 2022-04-30 LAB — CBC WITH DIFFERENTIAL (CANCER CENTER ONLY)
Abs Immature Granulocytes: 0.02 10*3/uL (ref 0.00–0.07)
Basophils Absolute: 0.1 10*3/uL (ref 0.0–0.1)
Basophils Relative: 2 %
Eosinophils Absolute: 0.1 10*3/uL (ref 0.0–0.5)
Eosinophils Relative: 3 %
HCT: 33.7 % — ABNORMAL LOW (ref 39.0–52.0)
Hemoglobin: 10.6 g/dL — ABNORMAL LOW (ref 13.0–17.0)
Immature Granulocytes: 1 %
Lymphocytes Relative: 17 %
Lymphs Abs: 0.7 10*3/uL (ref 0.7–4.0)
MCH: 25.3 pg — ABNORMAL LOW (ref 26.0–34.0)
MCHC: 31.5 g/dL (ref 30.0–36.0)
MCV: 80.4 fL (ref 80.0–100.0)
Monocytes Absolute: 0.5 10*3/uL (ref 0.1–1.0)
Monocytes Relative: 12 %
Neutro Abs: 2.7 10*3/uL (ref 1.7–7.7)
Neutrophils Relative %: 65 %
Platelet Count: 181 10*3/uL (ref 150–400)
RBC: 4.19 MIL/uL — ABNORMAL LOW (ref 4.22–5.81)
RDW: 19 % — ABNORMAL HIGH (ref 11.5–15.5)
WBC Count: 4 10*3/uL (ref 4.0–10.5)
nRBC: 0 % (ref 0.0–0.2)

## 2022-04-30 MED ORDER — SODIUM CHLORIDE 0.9 % IV SOLN: Freq: Once | INTRAVENOUS | Status: AC

## 2022-04-30 MED ORDER — PALONOSETRON HCL INJECTION 0.25 MG/5ML
0.2500 mg | Freq: Once | INTRAVENOUS | Status: AC
  Administered 2022-04-30: 0.25 mg via INTRAVENOUS
  Filled 2022-04-30: qty 5

## 2022-04-30 MED ORDER — SODIUM CHLORIDE 0.9% FLUSH
10.0000 mL | INTRAVENOUS | Status: DC | PRN
  Administered 2022-04-30: 10 mL

## 2022-04-30 MED ORDER — HYDROMORPHONE HCL 1 MG/ML IJ SOLN
2.0000 mg | Freq: Once | INTRAMUSCULAR | Status: AC
  Administered 2022-04-30: 2 mg via INTRAVENOUS
  Filled 2022-04-30: qty 2

## 2022-04-30 MED ORDER — SODIUM CHLORIDE 0.9 % IV SOLN
10.0000 mg | Freq: Once | INTRAVENOUS | Status: AC
  Administered 2022-04-30: 10 mg via INTRAVENOUS
  Filled 2022-04-30: qty 10

## 2022-04-30 MED ORDER — SODIUM CHLORIDE 0.9 % IV SOLN: Freq: Once | INTRAVENOUS | Status: DC

## 2022-04-30 MED ORDER — SODIUM CHLORIDE 0.9% FLUSH
10.0000 mL | Freq: Once | INTRAVENOUS | Status: AC
  Administered 2022-04-30: 10 mL

## 2022-04-30 MED ORDER — DEXTROSE 5 % IV SOLN
36.0000 mg/m2 | Freq: Once | INTRAVENOUS | Status: AC
  Administered 2022-04-30: 70 mg via INTRAVENOUS
  Filled 2022-04-30: qty 30

## 2022-04-30 MED ORDER — SODIUM CHLORIDE 0.9 % IV SOLN
300.0000 mg/m2 | Freq: Once | INTRAVENOUS | Status: AC
  Administered 2022-04-30: 580 mg via INTRAVENOUS
  Filled 2022-04-30: qty 4

## 2022-04-30 MED ORDER — TESTOSTERONE CYPIONATE 200 MG/ML IM SOLN
400.0000 mg | Freq: Once | INTRAMUSCULAR | Status: AC
  Administered 2022-04-30: 400 mg via INTRAMUSCULAR
  Filled 2022-04-30: qty 2

## 2022-04-30 MED ORDER — HEPARIN SOD (PORK) LOCK FLUSH 100 UNIT/ML IV SOLN
500.0000 [IU] | Freq: Once | INTRAVENOUS | Status: AC | PRN
  Administered 2022-04-30: 500 [IU]

## 2022-04-30 NOTE — Progress Notes (Signed)
Ok to treat with creatinine of 2.2 per Dr [name]. dph

## 2022-04-30 NOTE — Patient Instructions (Signed)
[facility]  Discharge Instructions: Thank you for choosing [facility] to provide your oncology and hematology care.   If you have a lab appointment with the [facility], please go directly to the [facility] and check in at the registration area.  Wear comfortable clothing and clothing appropriate for easy access to any Portacath or PICC line.   We strive to give you quality time with your provider. You may need to reschedule your appointment if you arrive late (15 or more minutes).  Arriving late affects you and other patients whose appointments are after yours.  Also, if you miss three or more appointments without notifying the office, you may be dismissed from the clinic at the provider's discretion.      For prescription refill requests, have your pharmacy contact our office and allow 72 hours for refills to be completed.    Today you received the following chemotherapy and/or immunotherapy agents Kyprolis, Cytoxan, testosterone       To help prevent nausea and vomiting after your treatment, we encourage you to take your nausea medication as directed.  BELOW ARE SYMPTOMS THAT SHOULD BE REPORTED IMMEDIATELY: *FEVER GREATER THAN 100.4 F (38 C) OR HIGHER *CHILLS OR SWEATING *NAUSEA AND VOMITING THAT IS NOT CONTROLLED WITH YOUR NAUSEA MEDICATION *UNUSUAL SHORTNESS OF BREATH *UNUSUAL BRUISING OR BLEEDING *URINARY PROBLEMS (pain or burning when urinating, or frequent urination) *BOWEL PROBLEMS (unusual diarrhea, constipation, pain near the anus) TENDERNESS IN MOUTH AND THROAT WITH OR WITHOUT PRESENCE OF ULCERS (sore throat, sores in mouth, or a toothache) UNUSUAL RASH, SWELLING OR PAIN  UNUSUAL VAGINAL DISCHARGE OR ITCHING   Items with * indicate a potential emergency and should be followed up as soon as possible or go to the Emergency Department if any problems should occur.  Please show the CHEMOTHERAPY ALERT CARD or  IMMUNOTHERAPY ALERT CARD at check-in to the Emergency Department and triage nurse. Should you have questions after your visit or need to cancel or reschedule your appointment, please contact [facility]  [phone] and follow the prompts.  Office hours are 8:00 a.m. to 4:30 p.m. Monday - Friday. Please note that voicemails left after 4:00 p.m. may not be returned until the following business day.  We are closed weekends and major holidays. You have access to a nurse at all times for urgent questions. Please call the main number to the clinic [phone] and follow the prompts.  For any non-urgent questions, you may also contact your provider using MyChart. We now offer e-Visits for anyone 68 and older to request care online for non-urgent symptoms. For details visit [email or web address].   Also download the MyChart app! Go to the app store, search "MyChart", open the app, select [facility], and log in with your MyChart username and password.

## 2022-04-30 NOTE — Patient Instructions (Signed)

## 2022-05-13 ENCOUNTER — Other Ambulatory Visit: Payer: Self-pay | Admitting: *Deleted

## 2022-05-13 DIAGNOSIS — C9000 Multiple myeloma not having achieved remission: Secondary | ICD-10-CM

## 2022-05-14 ENCOUNTER — Inpatient Hospital Stay: Payer: 59

## 2022-05-14 VITALS — BP 149/66 | HR 79 | Resp 17

## 2022-05-14 VITALS — BP 155/72 | HR 88 | Temp 98.5°F | Resp 17 | Ht 71.0 in | Wt 167.0 lb

## 2022-05-14 DIAGNOSIS — D631 Anemia in chronic kidney disease: Secondary | ICD-10-CM

## 2022-05-14 DIAGNOSIS — E349 Endocrine disorder, unspecified: Secondary | ICD-10-CM

## 2022-05-14 DIAGNOSIS — C9000 Multiple myeloma not having achieved remission: Secondary | ICD-10-CM

## 2022-05-14 DIAGNOSIS — Z5112 Encounter for antineoplastic immunotherapy: Secondary | ICD-10-CM | POA: Diagnosis not present

## 2022-05-14 DIAGNOSIS — Z299 Encounter for prophylactic measures, unspecified: Secondary | ICD-10-CM

## 2022-05-14 LAB — CMP (CANCER CENTER ONLY)
ALT: 27 U/L (ref 0–44)
AST: 24 U/L (ref 15–41)
Albumin: 3.9 g/dL (ref 3.5–5.0)
Alkaline Phosphatase: 84 U/L (ref 38–126)
Anion gap: 8 (ref 5–15)
BUN: 20 mg/dL (ref 8–23)
CO2: 26 mmol/L (ref 22–32)
Calcium: 8.4 mg/dL — ABNORMAL LOW (ref 8.9–10.3)
Chloride: 107 mmol/L (ref 98–111)
Creatinine: 1.97 mg/dL — ABNORMAL HIGH (ref 0.61–1.24)
GFR, Estimated: 35 mL/min — ABNORMAL LOW (ref >60)
Glucose, Bld: 114 mg/dL — ABNORMAL HIGH (ref 70–99)
Potassium: 4.2 mmol/L (ref 3.5–5.1)
Sodium: 141 mmol/L (ref 135–145)
Total Bilirubin: 0.5 mg/dL (ref 0.3–1.2)
Total Protein: 6 g/dL — ABNORMAL LOW (ref 6.5–8.1)

## 2022-05-14 LAB — CBC WITH DIFFERENTIAL (CANCER CENTER ONLY)
Abs Immature Granulocytes: 0.04 10*3/uL (ref 0.00–0.07)
Basophils Absolute: 0.1 10*3/uL (ref 0.0–0.1)
Basophils Relative: 2 %
Eosinophils Absolute: 0.1 10*3/uL (ref 0.0–0.5)
Eosinophils Relative: 3 %
HCT: 31.5 % — ABNORMAL LOW (ref 39.0–52.0)
Hemoglobin: 10.1 g/dL — ABNORMAL LOW (ref 13.0–17.0)
Immature Granulocytes: 1 %
Lymphocytes Relative: 17 %
Lymphs Abs: 0.8 10*3/uL (ref 0.7–4.0)
MCH: 25.4 pg — ABNORMAL LOW (ref 26.0–34.0)
MCHC: 32.1 g/dL (ref 30.0–36.0)
MCV: 79.3 fL — ABNORMAL LOW (ref 80.0–100.0)
Monocytes Absolute: 0.7 10*3/uL (ref 0.1–1.0)
Monocytes Relative: 16 %
Neutro Abs: 2.9 10*3/uL (ref 1.7–7.7)
Neutrophils Relative %: 61 %
Platelet Count: 183 10*3/uL (ref 150–400)
RBC: 3.97 MIL/uL — ABNORMAL LOW (ref 4.22–5.81)
RDW: 18.6 % — ABNORMAL HIGH (ref 11.5–15.5)
WBC Count: 4.7 10*3/uL (ref 4.0–10.5)
nRBC: 0 % (ref 0.0–0.2)

## 2022-05-14 MED ORDER — SODIUM CHLORIDE 0.9 % IV SOLN: Freq: Once | INTRAVENOUS | Status: AC

## 2022-05-14 MED ORDER — SODIUM CHLORIDE 0.9 % IV SOLN
300.0000 mg/m2 | Freq: Once | INTRAVENOUS | Status: AC
  Administered 2022-05-14: 580 mg via INTRAVENOUS
  Filled 2022-05-14: qty 29

## 2022-05-14 MED ORDER — SODIUM CHLORIDE 0.9 % IV SOLN
10.0000 mg | Freq: Once | INTRAVENOUS | Status: AC
  Administered 2022-05-14: 10 mg via INTRAVENOUS
  Filled 2022-05-14: qty 10

## 2022-05-14 MED ORDER — SODIUM CHLORIDE 0.9% FLUSH
10.0000 mL | INTRAVENOUS | Status: DC | PRN
  Administered 2022-05-14: 10 mL

## 2022-05-14 MED ORDER — DEXTROSE 5 % IV SOLN
36.0000 mg/m2 | Freq: Once | INTRAVENOUS | Status: AC
  Administered 2022-05-14: 70 mg via INTRAVENOUS
  Filled 2022-05-14: qty 30

## 2022-05-14 MED ORDER — HYDROMORPHONE HCL 1 MG/ML IJ SOLN
2.0000 mg | Freq: Once | INTRAMUSCULAR | Status: AC
  Administered 2022-05-14: 2 mg via INTRAVENOUS
  Filled 2022-05-14: qty 2

## 2022-05-14 MED ORDER — PALONOSETRON HCL INJECTION 0.25 MG/5ML
0.2500 mg | Freq: Once | INTRAVENOUS | Status: AC
  Administered 2022-05-14: 0.25 mg via INTRAVENOUS
  Filled 2022-05-14: qty 5

## 2022-05-14 MED ORDER — HEPARIN SOD (PORK) LOCK FLUSH 100 UNIT/ML IV SOLN
500.0000 [IU] | Freq: Once | INTRAVENOUS | Status: AC | PRN
  Administered 2022-05-14: 500 [IU]

## 2022-05-14 NOTE — Patient Instructions (Signed)

## 2022-05-14 NOTE — Patient Instructions (Signed)
[name]  Discharge Instructions: Thank you for choosing [facility] to provide your oncology and hematology care.   If you have a lab appointment with the [facility], please go directly to the [facility] and check in at the registration area.  Wear comfortable clothing and clothing appropriate for easy access to any Portacath or PICC line.   We strive to give you quality time with your provider. You may need to reschedule your appointment if you arrive late (15 or more minutes).  Arriving late affects you and other patients whose appointments are after yours.  Also, if you miss three or more appointments without notifying the office, you may be dismissed from the clinic at the provider's discretion.      For prescription refill requests, have your pharmacy contact our office and allow 72 hours for refills to be completed.    Today you received the following chemotherapy and/or immunotherapy agents kyprolis, cytoxan    To help prevent nausea and vomiting after your treatment, we encourage you to take your nausea medication as directed.  BELOW ARE SYMPTOMS THAT SHOULD BE REPORTED IMMEDIATELY: *FEVER GREATER THAN 100.4 F (38 C) OR HIGHER *CHILLS OR SWEATING *NAUSEA AND VOMITING THAT IS NOT CONTROLLED WITH YOUR NAUSEA MEDICATION *UNUSUAL SHORTNESS OF BREATH *UNUSUAL BRUISING OR BLEEDING *URINARY PROBLEMS (pain or burning when urinating, or frequent urination) *BOWEL PROBLEMS (unusual diarrhea, constipation, pain near the anus) TENDERNESS IN MOUTH AND THROAT WITH OR WITHOUT PRESENCE OF ULCERS (sore throat, sores in mouth, or a toothache) UNUSUAL RASH, SWELLING OR PAIN  UNUSUAL VAGINAL DISCHARGE OR ITCHING   Items with * indicate a potential emergency and should be followed up as soon as possible or go to the Emergency Department if any problems should occur.  Please show the CHEMOTHERAPY ALERT CARD or IMMUNOTHERAPY ALERT CARD at  check-in to the Emergency Department and triage nurse. Should you have questions after your visit or need to cancel or reschedule your appointment, please contact [facility]  [phone] and follow the prompts.  Office hours are 8:00 a.m. to 4:30 p.m. Monday - Friday. Please note that voicemails left after 4:00 p.m. may not be returned until the following business day.  We are closed weekends and major holidays. You have access to a nurse at all times for urgent questions. Please call the main number to the clinic [phone] and follow the prompts.  For any non-urgent questions, you may also contact your provider using MyChart. We now offer e-Visits for anyone 31 and older to request care online for non-urgent symptoms. For details visit [email or web address].   Also download the MyChart app! Go to the app store, search "MyChart", open the app, select [facility], and log in with your MyChart username and password.

## 2022-05-28 ENCOUNTER — Inpatient Hospital Stay: Payer: 59

## 2022-05-28 ENCOUNTER — Other Ambulatory Visit: Payer: Self-pay

## 2022-05-28 ENCOUNTER — Inpatient Hospital Stay: Payer: 59 | Attending: Hematology & Oncology | Admitting: Hematology & Oncology

## 2022-05-28 ENCOUNTER — Encounter: Payer: Self-pay | Admitting: Hematology & Oncology

## 2022-05-28 VITALS — BP 155/77 | HR 76 | Temp 98.1°F | Resp 16 | Ht 71.0 in | Wt 171.0 lb

## 2022-05-28 DIAGNOSIS — N189 Chronic kidney disease, unspecified: Secondary | ICD-10-CM | POA: Diagnosis not present

## 2022-05-28 DIAGNOSIS — Z5111 Encounter for antineoplastic chemotherapy: Secondary | ICD-10-CM | POA: Diagnosis present

## 2022-05-28 DIAGNOSIS — C9000 Multiple myeloma not having achieved remission: Secondary | ICD-10-CM

## 2022-05-28 DIAGNOSIS — Z5112 Encounter for antineoplastic immunotherapy: Secondary | ICD-10-CM | POA: Insufficient documentation

## 2022-05-28 DIAGNOSIS — Z299 Encounter for prophylactic measures, unspecified: Secondary | ICD-10-CM

## 2022-05-28 DIAGNOSIS — E291 Testicular hypofunction: Secondary | ICD-10-CM | POA: Insufficient documentation

## 2022-05-28 DIAGNOSIS — E119 Type 2 diabetes mellitus without complications: Secondary | ICD-10-CM | POA: Insufficient documentation

## 2022-05-28 DIAGNOSIS — E349 Endocrine disorder, unspecified: Secondary | ICD-10-CM

## 2022-05-28 DIAGNOSIS — D631 Anemia in chronic kidney disease: Secondary | ICD-10-CM | POA: Insufficient documentation

## 2022-05-28 DIAGNOSIS — N1832 Chronic kidney disease, stage 3b: Secondary | ICD-10-CM | POA: Insufficient documentation

## 2022-05-28 LAB — CBC WITH DIFFERENTIAL (CANCER CENTER ONLY)
Abs Immature Granulocytes: 0.03 10*3/uL (ref 0.00–0.07)
Basophils Absolute: 0.1 10*3/uL (ref 0.0–0.1)
Basophils Relative: 1 %
Eosinophils Absolute: 0.1 10*3/uL (ref 0.0–0.5)
Eosinophils Relative: 3 %
HCT: 28.9 % — ABNORMAL LOW (ref 39.0–52.0)
Hemoglobin: 9.4 g/dL — ABNORMAL LOW (ref 13.0–17.0)
Immature Granulocytes: 1 %
Lymphocytes Relative: 13 %
Lymphs Abs: 0.7 10*3/uL (ref 0.7–4.0)
MCH: 25.8 pg — ABNORMAL LOW (ref 26.0–34.0)
MCHC: 32.5 g/dL (ref 30.0–36.0)
MCV: 79.4 fL — ABNORMAL LOW (ref 80.0–100.0)
Monocytes Absolute: 0.6 10*3/uL (ref 0.1–1.0)
Monocytes Relative: 11 %
Neutro Abs: 3.7 10*3/uL (ref 1.7–7.7)
Neutrophils Relative %: 71 %
Platelet Count: 153 10*3/uL (ref 150–400)
RBC: 3.64 MIL/uL — ABNORMAL LOW (ref 4.22–5.81)
RDW: 17.9 % — ABNORMAL HIGH (ref 11.5–15.5)
WBC Count: 5.2 10*3/uL (ref 4.0–10.5)
nRBC: 0 % (ref 0.0–0.2)

## 2022-05-28 LAB — CMP (CANCER CENTER ONLY)
ALT: 24 U/L (ref 0–44)
AST: 23 U/L (ref 15–41)
Albumin: 3.8 g/dL (ref 3.5–5.0)
Alkaline Phosphatase: 71 U/L (ref 38–126)
Anion gap: 8 (ref 5–15)
BUN: 24 mg/dL — ABNORMAL HIGH (ref 8–23)
CO2: 26 mmol/L (ref 22–32)
Calcium: 8 mg/dL — ABNORMAL LOW (ref 8.9–10.3)
Chloride: 107 mmol/L (ref 98–111)
Creatinine: 1.95 mg/dL — ABNORMAL HIGH (ref 0.61–1.24)
GFR, Estimated: 36 mL/min — ABNORMAL LOW (ref >60)
Glucose, Bld: 87 mg/dL (ref 70–99)
Potassium: 3.8 mmol/L (ref 3.5–5.1)
Sodium: 141 mmol/L (ref 135–145)
Total Bilirubin: 0.4 mg/dL (ref 0.3–1.2)
Total Protein: 6.4 g/dL — ABNORMAL LOW (ref 6.5–8.1)

## 2022-05-28 LAB — IRON AND IRON BINDING CAPACITY (CC-WL,HP ONLY)
Iron: 77 ug/dL (ref 45–182)
Saturation Ratios: 34 % (ref 17.9–39.5)
TIBC: 230 ug/dL — ABNORMAL LOW (ref 250–450)
UIBC: 153 ug/dL (ref 117–376)

## 2022-05-28 LAB — FERRITIN: Ferritin: 1066 ng/mL — ABNORMAL HIGH (ref 24–336)

## 2022-05-28 LAB — LACTATE DEHYDROGENASE: LDH: 230 U/L — ABNORMAL HIGH (ref 98–192)

## 2022-05-28 MED ORDER — SODIUM CHLORIDE 0.9% FLUSH
10.0000 mL | INTRAVENOUS | Status: DC | PRN
  Administered 2022-05-28: 10 mL

## 2022-05-28 MED ORDER — SODIUM CHLORIDE 0.9 % IV SOLN
300.0000 mg/m2 | Freq: Once | INTRAVENOUS | Status: AC
  Administered 2022-05-28: 580 mg via INTRAVENOUS
  Filled 2022-05-28: qty 29

## 2022-05-28 MED ORDER — HEPARIN SOD (PORK) LOCK FLUSH 100 UNIT/ML IV SOLN
500.0000 [IU] | Freq: Once | INTRAVENOUS | Status: AC | PRN
  Administered 2022-05-28: 500 [IU]

## 2022-05-28 MED ORDER — SODIUM CHLORIDE 0.9 % IV SOLN: Freq: Once | INTRAVENOUS | Status: AC

## 2022-05-28 MED ORDER — TESTOSTERONE CYPIONATE 200 MG/ML IM SOLN
400.0000 mg | Freq: Once | INTRAMUSCULAR | Status: AC
  Administered 2022-05-28: 400 mg via INTRAMUSCULAR
  Filled 2022-05-28: qty 2

## 2022-05-28 MED ORDER — CYANOCOBALAMIN 1000 MCG/ML IJ SOLN
1000.0000 ug | Freq: Once | INTRAMUSCULAR | Status: AC
  Administered 2022-05-28: 1000 ug via INTRAMUSCULAR
  Filled 2022-05-28: qty 1

## 2022-05-28 MED ORDER — PALONOSETRON HCL INJECTION 0.25 MG/5ML
0.2500 mg | Freq: Once | INTRAVENOUS | Status: AC
  Administered 2022-05-28: 0.25 mg via INTRAVENOUS
  Filled 2022-05-28: qty 5

## 2022-05-28 MED ORDER — SODIUM CHLORIDE 0.9 % IV SOLN
10.0000 mg | Freq: Once | INTRAVENOUS | Status: AC
  Administered 2022-05-28: 10 mg via INTRAVENOUS
  Filled 2022-05-28: qty 10

## 2022-05-28 MED ORDER — DEXTROSE 5 % IV SOLN
36.0000 mg/m2 | Freq: Once | INTRAVENOUS | Status: AC
  Administered 2022-05-28: 70 mg via INTRAVENOUS
  Filled 2022-05-28: qty 30

## 2022-05-28 MED ORDER — EPOETIN ALFA-EPBX 40000 UNIT/ML IJ SOLN
40000.0000 [IU] | Freq: Once | INTRAMUSCULAR | Status: AC
  Administered 2022-05-28: 40000 [IU] via SUBCUTANEOUS
  Filled 2022-05-28: qty 1

## 2022-05-28 MED ORDER — HYDROMORPHONE HCL 1 MG/ML IJ SOLN
2.0000 mg | Freq: Once | INTRAMUSCULAR | Status: AC
  Administered 2022-05-28: 2 mg via INTRAVENOUS
  Filled 2022-05-28: qty 2

## 2022-05-28 NOTE — Patient Instructions (Signed)

## 2022-05-28 NOTE — Progress Notes (Signed)
Ok to treat with creatinine of 1.95 per Dr [name]. dph

## 2022-05-28 NOTE — Progress Notes (Signed)
Hematology and Oncology Follow Up Visit  Marcelle Komoroski [id number] October 10, 1950 72 y.o. 05/28/2022   Principle Diagnosis:  IgG kappa myeloma Anemia secondary to renal insufficiency Intermittent iron - deficiency anemia Hypotestosteronemia   Current Therapy:        Aredia 60 mg IV q  month  -- on hold starting 03/05/2022 -- needs tooth extrated --tooth extracted 03/2022 Retacrit SQ as needed for hemoglobin less than 10 DepoTestosterone 400 mg q 4 weeks IV iron as indicated Kyprolis/Cytoxan - s/p cycle #36   Interim History:  Mr. Westly is here today for follow-up and treatment.  He is doing pretty well.  He had a nice Easter weekend with his family.  He still is dealing with problems with respect to arthralgias and myalgias.  He is still has the diabetes.  We cannot give him Aredia until May.  He had the tooth extracted back in February.  He has had a decent appetite.  He has had no change in bowel or bladder habits.  He has had no nausea or vomiting.  There is been no rashes.  His last monoclonal spike was not found.  His IgG level was 785 mg/dL.  The Kappa light chain was 3.7 mg/dL.  Overall, I would say that his performance status is ECOG 1.    Medications:  Allergies as of 05/28/2022       Reactions   Iodinated Contrast Media Rash   Patient states he was instructed not to take IV contrast.  In 2008 he had an unknown reaction, and was told not to take it again.  He was also told not to take it due to his kidneys.   Iodine Anxiety, Rash, Other (See Comments)   Didn't feel right "instructed not to take per MD--something with his port"        Medication List        Accurate as of May 28, 2022  8:30 AM. If you have any questions, ask your nurse or doctor.          amLODipine 10 MG tablet Commonly known as: NORVASC Take 10 mg by mouth daily.   ammonium lactate 12 % lotion Commonly known as: AmLactin Apply 1 application topically as needed for dry skin.    atorvastatin 40 MG tablet Commonly known as: LIPITOR Take 40 mg by mouth daily at 6 PM.   budesonide-formoterol 160-4.5 MCG/ACT inhaler Commonly known as: SYMBICORT SMARTSIG:2 Puff(s) By Mouth Twice Daily   Carafate 1 GM/10ML suspension Generic drug: sucralfate Take 1 g by mouth 4 (four) times daily.   carvedilol 25 MG tablet Commonly known as: COREG Take 25 mg by mouth 2 (two) times daily.   chlorpheniramine-HYDROcodone 10-8 MG/5ML Commonly known as: TUSSIONEX SMARTSIG:5 Milliliter(s) By Mouth Every 12 Hours   [employer] Fiber Gummies 2 g Chew Chew by mouth daily.   diazepam 5 MG tablet Commonly known as: VALIUM Take by mouth at bedtime as needed.   dicyclomine 10 MG capsule Commonly known as: BENTYL TAKE 1 CAPSULE BY MOUTH 3 TIMES A DAY BEFORE MEALS   dronabinol 5 MG capsule Commonly known as: MARINOL Take 1 capsule (5 mg total) by mouth 2 (two) times daily before a meal.   gabapentin 300 MG capsule Commonly known as: NEURONTIN TAKE 1 CAPSULE BY MOUTH THREE TIMES A DAY What changed:  how much to take how to take this when to take this additional instructions   glimepiride 2 MG tablet Commonly known as: AMARYL Take 2 mg by mouth  daily.   hydrocortisone 2.5 % cream Apply topically 2 (two) times daily.   lactulose 10 GM/15ML solution Commonly known as: CHRONULAC Take 30 mLs (20 g total) by mouth daily.   latanoprost 0.005 % ophthalmic solution Commonly known as: XALATAN Place 1 drop into both eyes at bedtime.   linaclotide 145 MCG Caps capsule Commonly known as: LINZESS Take by mouth daily as needed.   losartan 50 MG tablet Commonly known as: COZAAR Take 50 mg by mouth daily.   Lumigan 0.01 % Soln Generic drug: bimatoprost Place 1 drop into both eyes 3 (three) times daily.   mesalamine 1.2 g EC tablet Commonly known as: LIALDA Take by mouth daily with breakfast.   metoprolol succinate 25 MG 24 hr tablet Commonly known as: TOPROL-XL Take 25 mg by  mouth daily.   mirabegron ER 25 MG Tb24 tablet Commonly known as: MYRBETRIQ Take 20 mg by mouth daily.   morphine 30 MG 12 hr tablet Commonly known as: MS CONTIN Take 30 mg by mouth 2 (two) times daily.   omeprazole 40 MG capsule Commonly known as: PRILOSEC TAKE 1 CAPSULE BY MOUTH TWICE A DAY   oxyCODONE 5 MG immediate release tablet Commonly known as: Roxicodone 1 tab PO q6 hours prn pain   oxyCODONE-acetaminophen 10-325 MG tablet Commonly known as: PERCOCET Take 1 tablet by mouth every 6 (six) hours as needed for pain.   pioglitazone 30 MG tablet Commonly known as: ACTOS Take 30 mg by mouth daily.   polyethylene glycol powder 17 GM/SCOOP powder Commonly known as: GLYCOLAX/MIRALAX SMARTSIG:1 scoopful By Mouth Twice Daily PRN   prochlorperazine 10 MG tablet Commonly known as: COMPAZINE take 1 tablet by mouth every 6 hours if needed for nausea and vomiting   Restasis 0.05 % ophthalmic emulsion Generic drug: cycloSPORINE Place 1 drop into both eyes daily.   SUMAtriptan 100 MG tablet Commonly known as: IMITREX Take 100 mg by mouth daily.   tadalafil 10 MG tablet Commonly known as: CIALIS Take by mouth.   topiramate 25 MG capsule Commonly known as: TOPAMAX Take 25 mg by mouth 2 (two) times daily.   topiramate 50 MG tablet Commonly known as: TOPAMAX Take 50 mg by mouth at bedtime.   torsemide 20 MG tablet Commonly known as: DEMADEX Take 20 mg by mouth daily.   Trulicity [id number] [id number] Sopn Generic drug: Dulaglutide Inject 0.75 mg into the skin every Saturday.   Vitamin D (Ergocalciferol) 1.25 MG (50000 UNIT) Caps capsule Commonly known as: DRISDOL Take 50,000 Units by mouth every Saturday.        Allergies:  Allergies  Allergen Reactions   Iodinated Contrast Media Rash    Patient states he was instructed not to take IV contrast.  In 2008 he had an unknown reaction, and was told not to take it again.  He was also told not to take it due to his  kidneys.   Iodine Anxiety, Rash and Other (See Comments)    Didn't feel right "instructed not to take per MD--something with his port"     Past Medical History, Surgical history, Social history, and Family History were reviewed and updated.  Review of Systems: Review of Systems  Constitutional:  Positive for malaise/fatigue.  HENT: Negative.    Eyes: Negative.   Respiratory: Negative.    Cardiovascular: Negative.   Gastrointestinal:  Positive for nausea.  Genitourinary: Negative.   Musculoskeletal:  Positive for joint pain and myalgias.  Skin: Negative.   Neurological: Negative.   Endo/Heme/Allergies:  Negative.   Psychiatric/Behavioral: Negative.       Physical Exam:  height is 5\' 11"  (1.803 m) and weight is 171 lb (77.6 kg). His oral temperature is 98.1 F (36.7 C). His blood pressure is 155/77 (abnormal) and his pulse is 76. His respiration is 16 and oxygen saturation is 97%.   Wt Readings from Last 3 Encounters:  05/28/22 171 lb (77.6 kg)  05/14/22 167 lb (75.8 kg)  04/16/22 166 lb (75.3 kg)   Physical Exam Vitals reviewed.  HENT:     Head: Normocephalic and atraumatic.  Eyes:     Pupils: Pupils are equal, round, and reactive to light.  Cardiovascular:     Rate and Rhythm: Normal rate and regular rhythm.     Heart sounds: Normal heart sounds.  Pulmonary:     Effort: Pulmonary effort is normal.     Breath sounds: Normal breath sounds.  Abdominal:     General: Bowel sounds are normal.     Palpations: Abdomen is soft.  Musculoskeletal:        General: No tenderness or deformity. Normal range of motion.     Cervical back: Normal range of motion.  Lymphadenopathy:     Cervical: No cervical adenopathy.  Skin:    General: Skin is warm and dry.     Findings: No erythema or rash.  Neurological:     Mental Status: He is alert and oriented to person, place, and time.  Psychiatric:        Behavior: Behavior normal.        Thought Content: Thought content normal.         Judgment: Judgment normal.      Lab Results  Component Value Date   WBC 5.2 05/28/2022   HGB 9.4 (L) 05/28/2022   HCT 28.9 (L) 05/28/2022   MCV 79.4 (L) 05/28/2022   PLT 153 05/28/2022   Lab Results  Component Value Date   FERRITIN 1,157 (H) 04/16/2022   IRON 59 04/16/2022   TIBC 220 (L) 04/16/2022   UIBC 161 04/16/2022   IRONPCTSAT 27 04/16/2022   Lab Results  Component Value Date   RETICCTPCT 0.9 04/16/2022   RBC 3.64 (L) 05/28/2022   RETICCTABS 27.5 03/21/2014   Lab Results  Component Value Date   KPAFRELGTCHN 37.1 (H) 04/16/2022   LAMBDASER 23.2 04/16/2022   KAPLAMBRATIO 1.60 04/16/2022   Lab Results  Component Value Date   IGGSERUM 785 04/16/2022   IGA 181 04/16/2022   IGMSERUM 28 04/16/2022   Lab Results  Component Value Date   TOTALPROTELP 6.7 03/05/2022   ALBUMINELP 3.6 03/05/2022   A1GS 0.2 03/05/2022   A2GS 0.9 03/05/2022   BETS 0.9 03/05/2022   BETA2SER 0.5 11/07/2014   GAMS 1.1 03/05/2022   MSPIKE Not Observed 03/05/2022   SPEI Comment 01/29/2022     Chemistry      Component Value Date/Time   NA 141 05/14/2022 0935   NA 143 02/22/2017 0744   K 4.2 05/14/2022 0935   K 4.0 02/22/2017 0744   CL 107 05/14/2022 0935   CL 106 02/22/2017 0744   CO2 26 05/14/2022 0935   CO2 27 02/22/2017 0744   BUN 20 05/14/2022 0935   BUN 26 (H) 02/22/2017 0744   CREATININE 1.97 (H) 05/14/2022 0935   CREATININE 2.3 (H) 02/22/2017 0744      Component Value Date/Time   CALCIUM 8.4 (L) 05/14/2022 0935   CALCIUM 9.3 02/22/2017 0744   ALKPHOS 84 05/14/2022 0935  ALKPHOS 80 02/22/2017 0744   AST 24 05/14/2022 0935   ALT 27 05/14/2022 0935   ALT 22 02/22/2017 0744   BILITOT 0.5 05/14/2022 0935       Impression and Plan: Mr. Jasa is a very pleasant 72 yo African American gentleman with IgG kappa myeloma.  We have been doing this for many years.  So far, he is done incredibly well.  He is on a very good treatment protocol that he is tolerating  well.  I am just glad that his quality of life is doing so well right now.  He does have quite a lot of swelling in his legs.  I think some of this might be from the amlodipine that he takes.  His blood pressure is good enough that he could double up on the torsemide.  He will need his Aranesp today.  Again, he does get his testosterone.  I know that this helps his quality of life.  We will go ahead and plan to get him back to see me in 6 more weeks.  He comes in every 2 weeks for treatment.    Volanda Napoleon, MD 4/4/20248:30 AM

## 2022-05-28 NOTE — Patient Instructions (Signed)
[facility] [place]  Discharge Instructions: Thank you for choosing [facility] to provide your oncology and hematology care.   If you have a lab appointment with the [facility], please go directly to the [facility] and check in at the registration area.  Wear comfortable clothing and clothing appropriate for easy access to any Portacath or PICC line.   We strive to give you quality time with your provider. You may need to reschedule your appointment if you arrive late (15 or more minutes).  Arriving late affects you and other patients whose appointments are after yours.  Also, if you miss three or more appointments without notifying the office, you may be dismissed from the clinic at the provider's discretion.      For prescription refill requests, have your pharmacy contact our office and allow 72 hours for refills to be completed.    Today you received the following chemotherapy and/or immunotherapy agents Kyprolis, Cytoxan, Retacrit, testosterone, B12      To help prevent nausea and vomiting after your treatment, we encourage you to take your nausea medication as directed.  BELOW ARE SYMPTOMS THAT SHOULD BE REPORTED IMMEDIATELY: *FEVER GREATER THAN 100.4 F (38 C) OR HIGHER *CHILLS OR SWEATING *NAUSEA AND VOMITING THAT IS NOT CONTROLLED WITH YOUR NAUSEA MEDICATION *UNUSUAL SHORTNESS OF BREATH *UNUSUAL BRUISING OR BLEEDING *URINARY PROBLEMS (pain or burning when urinating, or frequent urination) *BOWEL PROBLEMS (unusual diarrhea, constipation, pain near the anus) TENDERNESS IN MOUTH AND THROAT WITH OR WITHOUT PRESENCE OF ULCERS (sore throat, sores in mouth, or a toothache) UNUSUAL RASH, SWELLING OR PAIN  UNUSUAL VAGINAL DISCHARGE OR ITCHING   Items with * indicate a potential emergency and should be followed up as soon as possible or go to the Emergency Department if any problems should occur.  Please show the CHEMOTHERAPY ALERT CARD  or IMMUNOTHERAPY ALERT CARD at check-in to the Emergency Department and triage nurse. Should you have questions after your visit or need to cancel or reschedule your appointment, please contact [facility]  [phone] and follow the prompts.  Office hours are 8:00 a.m. to 4:30 p.m. Monday - Friday. Please note that voicemails left after 4:00 p.m. may not be returned until the following business day.  We are closed weekends and major holidays. You have access to a nurse at all times for urgent questions. Please call the main number to the clinic [phone] and follow the prompts.  For any non-urgent questions, you may also contact your provider using MyChart. We now offer e-Visits for anyone 23 and older to request care online for non-urgent symptoms. For details visit [email or web address].   Also download the MyChart app! Go to the app store, search "MyChart", open the app, select [facility], and log in with your MyChart username and password.

## 2022-05-29 ENCOUNTER — Other Ambulatory Visit: Payer: Self-pay

## 2022-05-29 LAB — IGG, IGA, IGM
IgA: 171 mg/dL (ref 61–437)
IgG (Immunoglobin G), Serum: 716 mg/dL (ref 603–1613)
IgM (Immunoglobulin M), Srm: 26 mg/dL (ref 15–143)

## 2022-05-29 LAB — KAPPA/LAMBDA LIGHT CHAINS
Kappa free light chain: 35 mg/L — ABNORMAL HIGH (ref 3.3–19.4)
Kappa, lambda light chain ratio: 1.63 (ref 0.26–1.65)
Lambda free light chains: 21.5 mg/L (ref 5.7–26.3)

## 2022-05-29 LAB — TESTOSTERONE: Testosterone: 208 ng/dL — ABNORMAL LOW (ref 264–916)

## 2022-06-01 LAB — PROTEIN ELECTROPHORESIS, SERUM, WITH REFLEX
A/G Ratio: 1.6 (ref 0.7–1.7)
Albumin ELP: 3.6 g/dL (ref 2.9–4.4)
Alpha-1-Globulin: 0.2 g/dL (ref 0.0–0.4)
Alpha-2-Globulin: 0.7 g/dL (ref 0.4–1.0)
Beta Globulin: 0.7 g/dL (ref 0.7–1.3)
Gamma Globulin: 0.7 g/dL (ref 0.4–1.8)
Globulin, Total: 2.3 g/dL (ref 2.2–3.9)
Total Protein ELP: 5.9 g/dL — ABNORMAL LOW (ref 6.0–8.5)

## 2022-06-07 ENCOUNTER — Other Ambulatory Visit: Payer: Self-pay

## 2022-06-11 ENCOUNTER — Inpatient Hospital Stay: Payer: 59

## 2022-06-11 VITALS — BP 166/75 | HR 93 | Temp 98.8°F | Resp 18

## 2022-06-11 DIAGNOSIS — E349 Endocrine disorder, unspecified: Secondary | ICD-10-CM

## 2022-06-11 DIAGNOSIS — Z5112 Encounter for antineoplastic immunotherapy: Secondary | ICD-10-CM | POA: Diagnosis not present

## 2022-06-11 DIAGNOSIS — D631 Anemia in chronic kidney disease: Secondary | ICD-10-CM

## 2022-06-11 DIAGNOSIS — C9000 Multiple myeloma not having achieved remission: Secondary | ICD-10-CM

## 2022-06-11 DIAGNOSIS — Z299 Encounter for prophylactic measures, unspecified: Secondary | ICD-10-CM

## 2022-06-11 LAB — CMP (CANCER CENTER ONLY)
ALT: 21 U/L (ref 0–44)
AST: 24 U/L (ref 15–41)
Albumin: 4 g/dL (ref 3.5–5.0)
Alkaline Phosphatase: 68 U/L (ref 38–126)
Anion gap: 12 (ref 5–15)
BUN: 43 mg/dL — ABNORMAL HIGH (ref 8–23)
CO2: 30 mmol/L (ref 22–32)
Calcium: 8 mg/dL — ABNORMAL LOW (ref 8.9–10.3)
Chloride: 99 mmol/L (ref 98–111)
Creatinine: 2.71 mg/dL — ABNORMAL HIGH (ref 0.61–1.24)
GFR, Estimated: 24 mL/min — ABNORMAL LOW (ref >60)
Glucose, Bld: 130 mg/dL — ABNORMAL HIGH (ref 70–99)
Potassium: 3.4 mmol/L — ABNORMAL LOW (ref 3.5–5.1)
Sodium: 141 mmol/L (ref 135–145)
Total Bilirubin: 0.4 mg/dL (ref 0.3–1.2)
Total Protein: 6.7 g/dL (ref 6.5–8.1)

## 2022-06-11 LAB — CBC WITH DIFFERENTIAL (CANCER CENTER ONLY)
Abs Immature Granulocytes: 0.09 10*3/uL — ABNORMAL HIGH (ref 0.00–0.07)
Basophils Absolute: 0.1 10*3/uL (ref 0.0–0.1)
Basophils Relative: 1 %
Eosinophils Absolute: 0 10*3/uL (ref 0.0–0.5)
Eosinophils Relative: 0 %
HCT: 32.2 % — ABNORMAL LOW (ref 39.0–52.0)
Hemoglobin: 10.3 g/dL — ABNORMAL LOW (ref 13.0–17.0)
Immature Granulocytes: 2 %
Lymphocytes Relative: 14 %
Lymphs Abs: 0.8 10*3/uL (ref 0.7–4.0)
MCH: 25.8 pg — ABNORMAL LOW (ref 26.0–34.0)
MCHC: 32 g/dL (ref 30.0–36.0)
MCV: 80.5 fL (ref 80.0–100.0)
Monocytes Absolute: 0.7 10*3/uL (ref 0.1–1.0)
Monocytes Relative: 13 %
Neutro Abs: 4.1 10*3/uL (ref 1.7–7.7)
Neutrophils Relative %: 70 %
Platelet Count: 213 10*3/uL (ref 150–400)
RBC: 4 MIL/uL — ABNORMAL LOW (ref 4.22–5.81)
RDW: 17.2 % — ABNORMAL HIGH (ref 11.5–15.5)
WBC Count: 5.8 10*3/uL (ref 4.0–10.5)
nRBC: 0 % (ref 0.0–0.2)

## 2022-06-11 MED ORDER — SODIUM CHLORIDE 0.9% FLUSH
10.0000 mL | Freq: Once | INTRAVENOUS | Status: AC
  Administered 2022-06-11: 10 mL

## 2022-06-11 MED ORDER — DEXTROSE 5 % IV SOLN
36.0000 mg/m2 | Freq: Once | INTRAVENOUS | Status: AC
  Administered 2022-06-11: 70 mg via INTRAVENOUS
  Filled 2022-06-11: qty 30

## 2022-06-11 MED ORDER — HEPARIN SOD (PORK) LOCK FLUSH 100 UNIT/ML IV SOLN
500.0000 [IU] | Freq: Once | INTRAVENOUS | Status: AC | PRN
  Administered 2022-06-11: 500 [IU]

## 2022-06-11 MED ORDER — HYDROMORPHONE HCL 1 MG/ML IJ SOLN
2.0000 mg | Freq: Once | INTRAMUSCULAR | Status: AC
  Administered 2022-06-11: 2 mg via INTRAVENOUS
  Filled 2022-06-11: qty 2

## 2022-06-11 MED ORDER — PALONOSETRON HCL INJECTION 0.25 MG/5ML
0.2500 mg | Freq: Once | INTRAVENOUS | Status: AC
  Administered 2022-06-11: 0.25 mg via INTRAVENOUS
  Filled 2022-06-11: qty 5

## 2022-06-11 MED ORDER — SODIUM CHLORIDE 0.9 % IV SOLN
300.0000 mg/m2 | Freq: Once | INTRAVENOUS | Status: AC
  Administered 2022-06-11: 580 mg via INTRAVENOUS
  Filled 2022-06-11: qty 29

## 2022-06-11 MED ORDER — SODIUM CHLORIDE 0.9 % IV SOLN
10.0000 mg | Freq: Once | INTRAVENOUS | Status: AC
  Administered 2022-06-11: 10 mg via INTRAVENOUS
  Filled 2022-06-11: qty 10

## 2022-06-11 MED ORDER — CYANOCOBALAMIN 1000 MCG/ML IJ SOLN
1000.0000 ug | Freq: Once | INTRAMUSCULAR | Status: AC
  Administered 2022-06-11: 1000 ug via INTRAMUSCULAR
  Filled 2022-06-11: qty 1

## 2022-06-11 MED ORDER — SODIUM CHLORIDE 0.9% FLUSH
10.0000 mL | INTRAVENOUS | Status: DC | PRN
  Administered 2022-06-11: 10 mL

## 2022-06-11 MED ORDER — SODIUM CHLORIDE 0.9 % IV SOLN: Freq: Once | INTRAVENOUS | Status: AC

## 2022-06-11 NOTE — Patient Instructions (Signed)

## 2022-06-11 NOTE — Progress Notes (Signed)
Okay to treat with SCr 2.71 per Clent Jacks, NP.

## 2022-06-11 NOTE — Patient Instructions (Signed)
[facility]  Discharge Instructions: Thank you for choosing [facility] to provide your oncology and hematology care.   If you have a lab appointment with the Cancer Center, please go directly to the Cancer Center and check in at the registration area.  Wear comfortable clothing and clothing appropriate for easy access to any Portacath or PICC line.   We strive to give you quality time with your provider. You may need to reschedule your appointment if you arrive late (15 or more minutes).  Arriving late affects you and other patients whose appointments are after yours.  Also, if you miss three or more appointments without notifying the office, you may be dismissed from the clinic at the provider's discretion.      For prescription refill requests, have your pharmacy contact our office and allow 72 hours for refills to be completed.    Today you received the following chemotherapy and/or immunotherapy agents kyprolis, cytoxan, B 12, dilaudid    To help prevent nausea and vomiting after your treatment, we encourage you to take your nausea medication as directed.  BELOW ARE SYMPTOMS THAT SHOULD BE REPORTED IMMEDIATELY: *FEVER GREATER THAN 100.4 F (38 C) OR HIGHER *CHILLS OR SWEATING *NAUSEA AND VOMITING THAT IS NOT CONTROLLED WITH YOUR NAUSEA MEDICATION *UNUSUAL SHORTNESS OF BREATH *UNUSUAL BRUISING OR BLEEDING *URINARY PROBLEMS (pain or burning when urinating, or frequent urination) *BOWEL PROBLEMS (unusual diarrhea, constipation, pain near the anus) TENDERNESS IN MOUTH AND THROAT WITH OR WITHOUT PRESENCE OF ULCERS (sore throat, sores in mouth, or a toothache) UNUSUAL RASH, SWELLING OR PAIN  UNUSUAL VAGINAL DISCHARGE OR ITCHING   Items with * indicate a potential emergency and should be followed up as soon as possible or go to the Emergency Department if any problems should occur.  Please show the CHEMOTHERAPY ALERT CARD or IMMUNOTHERAPY  ALERT CARD at check-in to the Emergency Department and triage nurse. Should you have questions after your visit or need to cancel or reschedule your appointment, please contact [facility] [place]  [phone] and follow the prompts.  Office hours are 8:00 a.m. to 4:30 p.m. Monday - Friday. Please note that voicemails left after 4:00 p.m. may not be returned until the following business day.  We are closed weekends and major holidays. You have access to a nurse at all times for urgent questions. Please call the main number to the clinic [phone] and follow the prompts.  For any non-urgent questions, you may also contact your provider using MyChart. We now offer e-Visits for anyone 18 and older to request care online for non-urgent symptoms. For details visit [email or web address].   Also download the MyChart app! Go to the app store, search "MyChart", open the app, select [facility], and log in with your MyChart username and password.

## 2022-06-12 ENCOUNTER — Ambulatory Visit (INDEPENDENT_AMBULATORY_CARE_PROVIDER_SITE_OTHER): Payer: 59 | Admitting: Podiatry

## 2022-06-12 ENCOUNTER — Encounter: Payer: Self-pay | Admitting: Podiatry

## 2022-06-12 DIAGNOSIS — L84 Corns and callosities: Secondary | ICD-10-CM | POA: Diagnosis not present

## 2022-06-12 DIAGNOSIS — E1151 Type 2 diabetes mellitus with diabetic peripheral angiopathy without gangrene: Secondary | ICD-10-CM

## 2022-06-12 DIAGNOSIS — B351 Tinea unguium: Secondary | ICD-10-CM

## 2022-06-12 DIAGNOSIS — M79675 Pain in left toe(s): Secondary | ICD-10-CM | POA: Diagnosis not present

## 2022-06-12 DIAGNOSIS — M79674 Pain in right toe(s): Secondary | ICD-10-CM | POA: Diagnosis not present

## 2022-06-12 NOTE — Progress Notes (Signed)
This patient returns to my office for at risk foot care.  This patient requires this care by a professional since this patient will be at risk due to having diabetes type 2.  This patient is unable to cut nails himself since the patient cannot reach his nails.These nails are painful walking and wearing shoes.  Painful callus under outside ball of both feet.This patient presents for at risk foot care today.  General Appearance  Alert, conversant and in no acute stress.  Vascular  Dorsalis pedis and posterior tibial  pulses are weakly  palpable  bilaterally.  Capillary return is within normal limits  bilaterally. Cold feet bilaterally.  Absent digital hair.  Neurologic  Senn-Weinstein monofilament wire test within normal limits/diminished right.  LOPS absent left foot. Muscle power within normal limits bilaterally.  Nails Thick disfigured discolored nails with subungual debris  from hallux to fifth toes bilaterally. No evidence of bacterial infection or drainage bilaterally.  Orthopedic  No limitations of motion  feet .  No crepitus or effusions noted.  No bony pathology or digital deformities noted.  HAV  B/L.  Exostosis right foot.Plantar flexed fifth metatarsall B/l.  Skin  normotropic skin with no porokeratosis noted bilaterally.  No signs of infections or ulcers noted.  Callus sub 5th met  B/l. No infection.  Onychomycosis  Pain in right toes  Pain in left toes  Porokeratosis  B/L  Consent was obtained for treatment procedures.   Mechanical debridement of nails 1-5  bilaterally performed with a nail nipper.  Filed with dremel without incident. Debride callus sub 5th  B/L with # 15 blade and dremel tool.   Return office visit    10 weeks                  Told patient to return for periodic foot care and evaluation due to potential at risk complications.   Kwabena Strutz DPM  

## 2022-06-13 ENCOUNTER — Other Ambulatory Visit: Payer: Self-pay

## 2022-06-23 ENCOUNTER — Encounter: Payer: Self-pay | Admitting: *Deleted

## 2022-06-25 ENCOUNTER — Inpatient Hospital Stay: Payer: 59 | Attending: Hematology & Oncology

## 2022-06-25 ENCOUNTER — Inpatient Hospital Stay: Payer: 59

## 2022-06-25 ENCOUNTER — Other Ambulatory Visit: Payer: Self-pay | Admitting: Hematology & Oncology

## 2022-06-25 DIAGNOSIS — Z5111 Encounter for antineoplastic chemotherapy: Secondary | ICD-10-CM | POA: Diagnosis present

## 2022-06-25 DIAGNOSIS — D509 Iron deficiency anemia, unspecified: Secondary | ICD-10-CM | POA: Insufficient documentation

## 2022-06-25 DIAGNOSIS — C9000 Multiple myeloma not having achieved remission: Secondary | ICD-10-CM

## 2022-06-25 DIAGNOSIS — N184 Chronic kidney disease, stage 4 (severe): Secondary | ICD-10-CM | POA: Insufficient documentation

## 2022-06-25 DIAGNOSIS — E119 Type 2 diabetes mellitus without complications: Secondary | ICD-10-CM | POA: Insufficient documentation

## 2022-06-25 DIAGNOSIS — E291 Testicular hypofunction: Secondary | ICD-10-CM | POA: Insufficient documentation

## 2022-06-25 DIAGNOSIS — D631 Anemia in chronic kidney disease: Secondary | ICD-10-CM | POA: Diagnosis not present

## 2022-06-25 DIAGNOSIS — E349 Endocrine disorder, unspecified: Secondary | ICD-10-CM

## 2022-06-25 DIAGNOSIS — Z299 Encounter for prophylactic measures, unspecified: Secondary | ICD-10-CM

## 2022-06-25 DIAGNOSIS — Z5112 Encounter for antineoplastic immunotherapy: Secondary | ICD-10-CM | POA: Diagnosis present

## 2022-06-25 DIAGNOSIS — M2550 Pain in unspecified joint: Secondary | ICD-10-CM

## 2022-06-25 DIAGNOSIS — N189 Chronic kidney disease, unspecified: Secondary | ICD-10-CM

## 2022-06-25 LAB — CMP (CANCER CENTER ONLY)
ALT: 36 U/L (ref 0–44)
AST: 28 U/L (ref 15–41)
Albumin: 3.9 g/dL (ref 3.5–5.0)
Alkaline Phosphatase: 75 U/L (ref 38–126)
Anion gap: 9 (ref 5–15)
BUN: 40 mg/dL — ABNORMAL HIGH (ref 8–23)
CO2: 27 mmol/L (ref 22–32)
Calcium: 9.2 mg/dL (ref 8.9–10.3)
Chloride: 106 mmol/L (ref 98–111)
Creatinine: 2.59 mg/dL — ABNORMAL HIGH (ref 0.61–1.24)
GFR, Estimated: 26 mL/min — ABNORMAL LOW (ref >60)
Glucose, Bld: 187 mg/dL — ABNORMAL HIGH (ref 70–99)
Potassium: 3.7 mmol/L (ref 3.5–5.1)
Sodium: 142 mmol/L (ref 135–145)
Total Bilirubin: 0.3 mg/dL (ref 0.3–1.2)
Total Protein: 6.9 g/dL (ref 6.5–8.1)

## 2022-06-25 LAB — CBC WITH DIFFERENTIAL (CANCER CENTER ONLY)
Abs Immature Granulocytes: 0.04 10*3/uL (ref 0.00–0.07)
Basophils Absolute: 0.1 10*3/uL (ref 0.0–0.1)
Basophils Relative: 1 %
Eosinophils Absolute: 0.1 10*3/uL (ref 0.0–0.5)
Eosinophils Relative: 2 %
HCT: 32.8 % — ABNORMAL LOW (ref 39.0–52.0)
Hemoglobin: 10.5 g/dL — ABNORMAL LOW (ref 13.0–17.0)
Immature Granulocytes: 1 %
Lymphocytes Relative: 16 %
Lymphs Abs: 0.9 10*3/uL (ref 0.7–4.0)
MCH: 25.7 pg — ABNORMAL LOW (ref 26.0–34.0)
MCHC: 32 g/dL (ref 30.0–36.0)
MCV: 80.2 fL (ref 80.0–100.0)
Monocytes Absolute: 0.5 10*3/uL (ref 0.1–1.0)
Monocytes Relative: 9 %
Neutro Abs: 4.1 10*3/uL (ref 1.7–7.7)
Neutrophils Relative %: 71 %
Platelet Count: 178 10*3/uL (ref 150–400)
RBC: 4.09 MIL/uL — ABNORMAL LOW (ref 4.22–5.81)
RDW: 16.3 % — ABNORMAL HIGH (ref 11.5–15.5)
WBC Count: 5.7 10*3/uL (ref 4.0–10.5)
nRBC: 0 % (ref 0.0–0.2)

## 2022-06-25 MED ORDER — CYANOCOBALAMIN 1000 MCG/ML IJ SOLN
1000.0000 ug | Freq: Once | INTRAMUSCULAR | Status: AC
  Administered 2022-06-25: 1000 ug via INTRAMUSCULAR
  Filled 2022-06-25: qty 1

## 2022-06-25 MED ORDER — SODIUM CHLORIDE 0.9 % IV SOLN: Freq: Once | INTRAVENOUS | Status: AC

## 2022-06-25 MED ORDER — TESTOSTERONE CYPIONATE 200 MG/ML IM SOLN
400.0000 mg | Freq: Once | INTRAMUSCULAR | Status: AC
  Administered 2022-06-25: 400 mg via INTRAMUSCULAR
  Filled 2022-06-25: qty 2

## 2022-06-25 MED ORDER — SODIUM CHLORIDE 0.9 % IV SOLN
300.0000 mg/m2 | Freq: Once | INTRAVENOUS | Status: AC
  Administered 2022-06-25: 580 mg via INTRAVENOUS
  Filled 2022-06-25: qty 29

## 2022-06-25 MED ORDER — SODIUM CHLORIDE 0.9% FLUSH
10.0000 mL | INTRAVENOUS | Status: DC | PRN
  Administered 2022-06-25: 10 mL

## 2022-06-25 MED ORDER — HYDROMORPHONE HCL 1 MG/ML IJ SOLN
2.0000 mg | Freq: Once | INTRAMUSCULAR | Status: AC
  Administered 2022-06-25: 2 mg via INTRAVENOUS
  Filled 2022-06-25: qty 2

## 2022-06-25 MED ORDER — DEXTROSE 5 % IV SOLN
36.0000 mg/m2 | Freq: Once | INTRAVENOUS | Status: AC
  Administered 2022-06-25: 70 mg via INTRAVENOUS
  Filled 2022-06-25: qty 30

## 2022-06-25 MED ORDER — SODIUM CHLORIDE 0.9 % IV SOLN
10.0000 mg | Freq: Once | INTRAVENOUS | Status: AC
  Administered 2022-06-25: 10 mg via INTRAVENOUS
  Filled 2022-06-25: qty 10

## 2022-06-25 MED ORDER — HEPARIN SOD (PORK) LOCK FLUSH 100 UNIT/ML IV SOLN
500.0000 [IU] | Freq: Once | INTRAVENOUS | Status: AC | PRN
  Administered 2022-06-25: 500 [IU]

## 2022-06-25 MED ORDER — PALONOSETRON HCL INJECTION 0.25 MG/5ML
0.2500 mg | Freq: Once | INTRAVENOUS | Status: AC
  Administered 2022-06-25: 0.25 mg via INTRAVENOUS
  Filled 2022-06-25: qty 5

## 2022-06-25 NOTE — Patient Instructions (Signed)
[facility]  Discharge Instructions: Thank you for choosing [facility] to provide your oncology and hematology care.   If you have a lab appointment with the Cancer Center, please go directly to the Cancer Center and check in at the registration area.  Wear comfortable clothing and clothing appropriate for easy access to any Portacath or PICC line.   We strive to give you quality time with your provider. You may need to reschedule your appointment if you arrive late (15 or more minutes).  Arriving late affects you and other patients whose appointments are after yours.  Also, if you miss three or more appointments without notifying the office, you may be dismissed from the clinic at the provider's discretion.      For prescription refill requests, have your pharmacy contact our office and allow 72 hours for refills to be completed.    Today you received the following chemotherapy and/or immunotherapy agents Cytoxan, Kyprolis      To help prevent nausea and vomiting after your treatment, we encourage you to take your nausea medication as directed.  BELOW ARE SYMPTOMS THAT SHOULD BE REPORTED IMMEDIATELY: *FEVER GREATER THAN 100.4 F (38 C) OR HIGHER *CHILLS OR SWEATING *NAUSEA AND VOMITING THAT IS NOT CONTROLLED WITH YOUR NAUSEA MEDICATION *UNUSUAL SHORTNESS OF BREATH *UNUSUAL BRUISING OR BLEEDING *URINARY PROBLEMS (pain or burning when urinating, or frequent urination) *BOWEL PROBLEMS (unusual diarrhea, constipation, pain near the anus) TENDERNESS IN MOUTH AND THROAT WITH OR WITHOUT PRESENCE OF ULCERS (sore throat, sores in mouth, or a toothache) UNUSUAL RASH, SWELLING OR PAIN  UNUSUAL VAGINAL DISCHARGE OR ITCHING   Items with * indicate a potential emergency and should be followed up as soon as possible or go to the Emergency Department if any problems should occur.  Please show the CHEMOTHERAPY ALERT CARD or IMMUNOTHERAPY ALERT CARD at  check-in to the Emergency Department and triage nurse. Should you have questions after your visit or need to cancel or reschedule your appointment, please contact [facility] [place]  [phone] and follow the prompts.  Office hours are 8:00 a.m. to 4:30 p.m. Monday - Friday. Please note that voicemails left after 4:00 p.m. may not be returned until the following business day.  We are closed weekends and major holidays. You have access to a nurse at all times for urgent questions. Please call the main number to the clinic [phone] and follow the prompts.  For any non-urgent questions, you may also contact your provider using MyChart. We now offer e-Visits for anyone 3 and older to request care online for non-urgent symptoms. For details visit [email or web address].   Also download the MyChart app! Go to the app store, search "MyChart", open the app, select [facility], and log in with your MyChart username and password.

## 2022-06-25 NOTE — Patient Instructions (Signed)

## 2022-06-25 NOTE — Progress Notes (Signed)
Ok to treat with creatinine of 2.59 per Dr Myna Hidalgo. dph

## 2022-06-30 ENCOUNTER — Emergency Department (HOSPITAL_BASED_OUTPATIENT_CLINIC_OR_DEPARTMENT_OTHER): Payer: 59

## 2022-06-30 ENCOUNTER — Encounter (HOSPITAL_BASED_OUTPATIENT_CLINIC_OR_DEPARTMENT_OTHER): Payer: Self-pay | Admitting: Urology

## 2022-06-30 ENCOUNTER — Other Ambulatory Visit: Payer: Self-pay

## 2022-06-30 ENCOUNTER — Emergency Department (HOSPITAL_BASED_OUTPATIENT_CLINIC_OR_DEPARTMENT_OTHER)
Admission: EM | Admit: 2022-06-30 | Discharge: 2022-06-30 | Disposition: A | Discharge status: Home / Self Care | Payer: 59 | Attending: Emergency Medicine | Admitting: Emergency Medicine

## 2022-06-30 DIAGNOSIS — Z8579 Personal history of other malignant neoplasms of lymphoid, hematopoietic and related tissues: Secondary | ICD-10-CM | POA: Insufficient documentation

## 2022-06-30 DIAGNOSIS — Z794 Long term (current) use of insulin: Secondary | ICD-10-CM | POA: Diagnosis not present

## 2022-06-30 DIAGNOSIS — M545 Low back pain, unspecified: Secondary | ICD-10-CM | POA: Diagnosis present

## 2022-06-30 DIAGNOSIS — M5441 Lumbago with sciatica, right side: Secondary | ICD-10-CM | POA: Insufficient documentation

## 2022-06-30 MED ORDER — ONDANSETRON 4 MG PO TBDP
4.0000 mg | ORAL_TABLET | Freq: Once | ORAL | Status: AC
  Administered 2022-06-30: 4 mg via ORAL
  Filled 2022-06-30: qty 1

## 2022-06-30 MED ORDER — METHOCARBAMOL 500 MG PO TABS: 500.0000 mg | ORAL_TABLET | Freq: Two times a day (BID) | ORAL | 0 refills | Status: DC

## 2022-06-30 MED ORDER — HYDROMORPHONE HCL 1 MG/ML IJ SOLN
2.0000 mg | Freq: Once | INTRAMUSCULAR | Status: AC
  Administered 2022-06-30: 2 mg via INTRAMUSCULAR
  Filled 2022-06-30: qty 2

## 2022-06-30 NOTE — ED Notes (Signed)
Report rec'd from prev RN 

## 2022-06-30 NOTE — ED Provider Notes (Signed)
[facility] Provider Note   CSN: [id number] Arrival date & time: 06/30/22  1055     History  Chief Complaint  Patient presents with   Back Pain    [name] is a 72 y.o. male.  Patient with history of multiple myeloma, currently undergoing treatment, followed by Dr. Myna Hidalgo --presents to the emergency department for evaluation of progressively worsening lower back pain over the past several weeks.  He has noted this to his oncologist who has ordered an MRI of the lumbar spine however this has not yet been done.  Patient is chronically on opioid pain medications which have not been helping his symptoms.  He reports that the pain radiates down in the left leg all the way to the foot.  He denies any recent falls or injuries.  He has been having normal urination and bowel movements.  No numbness or tingling.  States that his leg feels weak due to pain.       Home Medications Prior to Admission medications   Medication Sig Start Date End Date Taking? Authorizing Provider  amLODipine (NORVASC) 10 MG tablet Take 10 mg by mouth daily.    [provider]  ammonium lactate (AMLACTIN) 12 % lotion Apply 1 application topically as needed for dry skin. 04/11/21   Candelaria Stagers, DPM  atorvastatin (LIPITOR) 40 MG tablet Take 40 mg by mouth daily at 6 PM.  08/20/18   [provider]  budesonide-formoterol (SYMBICORT) 160-4.5 MCG/ACT inhaler SMARTSIG:2 Puff(s) By Mouth Twice Daily 03/18/21   [provider]  CARAFATE 1 GM/10ML suspension Take 1 g by mouth 4 (four) times daily.  07/28/11   [provider]  carvedilol (COREG) 25 MG tablet Take 25 mg by mouth 2 (two) times daily. 04/29/20   [provider]  chlorpheniramine-HYDROcodone 10-8 MG/5ML SMARTSIG:5 Milliliter(s) By Mouth Every 12 Hours 04/14/21   [provider]  [employer] Fiber Gummies 2 g CHEW Chew by mouth daily. 03/09/20   [provider]   diazepam (VALIUM) 5 MG tablet Take by mouth at bedtime as needed.    [provider]  dicyclomine (BENTYL) 10 MG capsule TAKE 1 CAPSULE BY MOUTH 3 TIMES A DAY BEFORE MEALS 06/21/21   Josph Macho, MD  dronabinol (MARINOL) 5 MG capsule Take 1 capsule (5 mg total) by mouth 2 (two) times daily before a meal. 08/27/21   Erenest Blank, NP  gabapentin (NEURONTIN) 300 MG capsule TAKE 1 CAPSULE BY MOUTH THREE TIMES A DAY Patient taking differently: Take 300 mg by mouth 3 (three) times daily. 07/20/19   Erenest Blank, NP  glimepiride (AMARYL) 2 MG tablet Take 2 mg by mouth daily. 09/18/20   [provider]  hydrocortisone 2.5 % cream Apply topically 2 (two) times daily. 02/15/20   [provider]  lactulose (CHRONULAC) 10 GM/15ML solution Take 30 mLs (20 g total) by mouth daily. 02/02/19   Josph Macho, MD  latanoprost (XALATAN) 0.005 % ophthalmic solution Place 1 drop into both eyes at bedtime. 01/10/20   [provider]  linaclotide Karlene Einstein) 145 MCG CAPS capsule Take by mouth daily as needed.    [provider]  losartan (COZAAR) 50 MG tablet Take 50 mg by mouth daily. 02/25/19   [provider]  LUMIGAN 0.01 % SOLN Place 1 drop into both eyes 3 (three) times daily. 01/06/20   [provider]  mesalamine (LIALDA) 1.2 g EC tablet Take by  mouth daily with breakfast.    [provider]  metoprolol succinate (TOPROL-XL) 25 MG 24 hr tablet Take 25 mg by mouth daily. 07/28/19   [provider]  mirabegron ER (MYRBETRIQ) 25 MG TB24 tablet Take 20 mg by mouth daily.    [provider]  morphine (MS CONTIN) 30 MG 12 hr tablet Take 30 mg by mouth 2 (two) times daily. 06/29/18   [provider]  omeprazole (PRILOSEC) 40 MG capsule TAKE 1 CAPSULE BY MOUTH TWICE A DAY 10/04/19   Josph Macho, MD  oxyCODONE (ROXICODONE) 5 MG immediate release tablet 1 tab PO q6 hours prn pain 05/09/20   Betha Loa, MD   oxyCODONE-acetaminophen (PERCOCET) 10-325 MG tablet Take 1 tablet by mouth every 6 (six) hours as needed for pain.    [provider]  pioglitazone (ACTOS) 30 MG tablet Take 30 mg by mouth daily. 03/06/18   [provider]  polyethylene glycol powder (GLYCOLAX/MIRALAX) 17 GM/SCOOP powder SMARTSIG:1 scoopful By Mouth Twice Daily PRN 06/16/21   [provider]  prochlorperazine (COMPAZINE) 10 MG tablet take 1 tablet by mouth every 6 hours if needed for nausea and vomiting 12/29/21   Josph Macho, MD  RESTASIS 0.05 % ophthalmic emulsion Place 1 drop into both eyes daily.  05/30/19   [provider]  SUMAtriptan (IMITREX) 100 MG tablet Take 100 mg by mouth daily. 08/22/19   [provider]  tadalafil (CIALIS) 10 MG tablet Take by mouth. 05/01/20 05/31/20  [provider]  topiramate (TOPAMAX) 25 MG capsule Take 25 mg by mouth 2 (two) times daily.    [provider]  topiramate (TOPAMAX) 50 MG tablet Take 50 mg by mouth at bedtime. 07/07/21   [provider]  torsemide (DEMADEX) 20 MG tablet Take 20 mg by mouth daily. 01/12/19   [provider]  TRULICITY 0.75 MG/0.5ML SOPN Inject 0.75 mg into the skin every Saturday.  04/19/19   [provider]  Vitamin D, Ergocalciferol, (DRISDOL) 50000 UNITS CAPS capsule Take 50,000 Units by mouth every Saturday.     [provider]  potassium chloride (KLOR-CON) 20 MEQ packet Take 20 mEq by mouth daily.   05/17/11  [provider]  potassium chloride SA (K-DUR,KLOR-CON) 20 MEQ tablet Take 1 tablet (20 mEq total) by mouth 2 (two) times daily. Patient not taking: Reported on 05/01/2019 11/07/14 05/01/19  Josph Macho, MD      Allergies    Iodinated contrast media and Iodine    Review of Systems   Review of Systems  Physical Exam Updated Vital Signs BP (!) 173/76   Pulse 87   Temp 98.2 F (36.8 C)   Resp 17   Ht 5\' 11"  (1.803 m)   Wt 77.6 kg   SpO2 97%    BMI 23.86 kg/m   Physical Exam Vitals and nursing note reviewed.  Constitutional:      Appearance: He is well-developed.  HENT:     Head: Normocephalic and atraumatic.  Eyes:     Conjunctiva/sclera: Conjunctivae normal.  Abdominal:     Palpations: Abdomen is soft.     Tenderness: There is no abdominal tenderness. There is no right CVA tenderness or left CVA tenderness.  Musculoskeletal:        General: Normal range of motion.     Cervical back: Normal range of motion. No spasms, tenderness or bony tenderness. Normal range of motion.     Thoracic back: No tenderness or bony  tenderness. Normal range of motion.     Lumbar back: Tenderness and bony tenderness present. Normal range of motion.       Back:     Comments: No step-off noted with palpation of spine.   Patient winces to palpation over the right SI joint area and posterior pelvis.  Skin:    General: Skin is warm and dry.  Neurological:     Mental Status: He is alert.     Sensory: No sensory deficit.     Motor: No abnormal muscle tone.     Comments: 5/5 strength in entire lower extremities bilaterally. No sensation deficit.   Psychiatric:        Mood and Affect: Mood normal.     ED Results / Procedures / Treatments   Labs (all labs ordered are listed, but only abnormal results are displayed) Labs Reviewed - No data to display  EKG None  Radiology DG Lumbar Spine Complete  Result Date: 06/30/2022 CLINICAL DATA:  Several month history of lower back pain with left-sided radiculopathy EXAM: LUMBAR SPINE - COMPLETE 5 VIEW COMPARISON:  None Available. FINDINGS: There is no evidence of lumbar spine fracture. Straightening of the lumbar lordosis. Multilevel degenerative changes of the lumbar spine characterized by exuberant disc osteophyte formation with neural foraminal narrowing. No substantial intervertebral disc space narrowing. IMPRESSION: Multilevel degenerative changes of the lumbar spine characterized by exuberant  disc osteophyte formation with neural foraminal narrowing. Electronically Signed   By: Agustin Cree M.D.   On: 06/30/2022 11:54    Procedures Procedures    Medications Ordered in ED Medications  HYDROmorphone (DILAUDID) injection 2 mg (2 mg Intramuscular Given 06/30/22 1354)  ondansetron (ZOFRAN-ODT) disintegrating tablet 4 mg (4 mg Oral Given 06/30/22 1353)    ED Course/ Medical Decision Making/ A&P    Patient seen and examined. History obtained directly from patient.  I have also reviewed patient's previous oncology note.  Labs/EKG: None ordered.  Imaging: Ordered CT pelvis without contrast.  X-ray of the lumbar spine was personally reviewed and interpreted.  Agree degenerative changes.  Medications/Fluids: Ordered: IM Dilaudid, ODT Zofran.   Most recent vital signs reviewed and are as follows: BP (!) 173/76   Pulse 87   Temp 98.2 F (36.8 C)   Resp 17   Ht 5\' 11"  (1.803 m)   Wt 77.6 kg   SpO2 97%   BMI 23.86 kg/m   Initial impression: Symptoms sound radicular in nature and very well could be due to a nerve compression in the lower back, however given his history of multiple myeloma and pain over the posterior pelvis, would like imaging of this area to ensure no bony abnormalities.  Will reassess after pain control.  Due to possibility of cauda equina syndrome, however patient without any other features to suggest this today.  I do not feel that he requires transfer for emergent MRI at this time.  I discussed possible use of steroids with patient, however he would like to avoid this due to history of chronic kidney disease.  3:28 PM Reassessment performed. Patient appears more comfortable.  States pain is better now.  He has been provided with crutches that fit him.  Imaging personally visualized and interpreted including: CT pelvis, agree no concerning or acute bony findings  Reviewed pertinent lab work and imaging with patient at bedside. Questions answered.   Most current  vital signs reviewed and are as follows: BP (!) 173/76   Pulse 87   Temp 98.2 F (36.8  C)   Resp 17   Ht 5\' 11"  (1.803 m)   Wt 77.6 kg   SpO2 97%   BMI 23.86 kg/m   Plan: Discharge to home.   Prescriptions written for: Robaxin  Patient counseled on proper use of muscle relaxant medication.  They were told not to drink alcohol, drive any vehicle, or do any dangerous activities while taking this medication.  Patient verbalized understanding.  Use pain medication only under direct supervision at the lowest possible dose needed to control your pain.   Other home care instructions discussed: Rest  ED return instructions discussed: Uncontrolled pain, difficulty walking  Follow-up instructions discussed: Patient encouraged to follow-up with their oncologist to discuss upcoming MRI scheduled.                            Medical Decision Making Amount and/or Complexity of Data Reviewed Radiology: ordered.  Risk Prescription drug management.   Patient with worsening left lower back pain with radicular features.  He has a history of myeloma.  Other than history of cancer, no acute neurologic findings that would suggest cauda equina syndrome.  Low concern for epidural abscess or infection at this time.  X-ray and CT today suggestive of arthritis.  Is possible he has radiculopathy from nerve root compression related to this.  His oncologist has ordered an MRI which has not yet been performed.  Patient would like to avoid steroid treatment at this time.  Symptoms improved in the ED with treatment.  Will give trial of Robaxin.         Final Clinical Impression(s) / ED Diagnoses Final diagnoses:  Acute right-sided low back pain with right-sided sciatica    Rx / DC Orders ED Discharge Orders          Ordered    methocarbamol (ROBAXIN) 500 MG tablet  2 times daily        06/30/22 1526              Renne Crigler, PA-C 06/30/22 1532    Gwyneth Sprout, MD 07/04/22  [phone]

## 2022-06-30 NOTE — ED Triage Notes (Signed)
Pt states lower back pain x 2-3 months States has gradually worsening, pain worse when sitting and standing   CA pt, getting chemo and radiation

## 2022-06-30 NOTE — Discharge Instructions (Signed)
The x-ray of your lower back and CT of the pelvis did not show any bony changes or problems that would be related to your multiple myeloma.  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  I have sent in a muscle relaxer to try in addition to your pain medication and gabapentin.  Please follow-up with your oncologist in relation to the MRI that has been ordered.

## 2022-07-01 ENCOUNTER — Telehealth: Payer: Self-pay

## 2022-07-01 NOTE — Telephone Encounter (Signed)
Received call from pt stating that when he received call to schedule MRI they requested he confirm with Dr Myna Hidalgo that he intends for pt to have contrast with creatinine of 2.59.   Per Dr Myna Hidalgo, ok for pt to receive IV contrast with creatinine of 2.59. recheck at appt next week 5/16 and will give extra IVF prn. Pt verbalizes understanding using teachback. dph

## 2022-07-02 ENCOUNTER — Other Ambulatory Visit: Payer: Self-pay | Admitting: *Deleted

## 2022-07-02 DIAGNOSIS — M2550 Pain in unspecified joint: Secondary | ICD-10-CM

## 2022-07-02 DIAGNOSIS — C9000 Multiple myeloma not having achieved remission: Secondary | ICD-10-CM

## 2022-07-07 ENCOUNTER — Ambulatory Visit (HOSPITAL_COMMUNITY)
Admission: RE | Admit: 2022-07-07 | Discharge: 2022-07-07 | Disposition: A | Discharge status: Home / Self Care | Payer: 59 | Source: Ambulatory Visit | Attending: Hematology & Oncology | Admitting: Hematology & Oncology

## 2022-07-07 DIAGNOSIS — M2550 Pain in unspecified joint: Secondary | ICD-10-CM | POA: Diagnosis present

## 2022-07-07 DIAGNOSIS — C9000 Multiple myeloma not having achieved remission: Secondary | ICD-10-CM | POA: Diagnosis present

## 2022-07-09 ENCOUNTER — Inpatient Hospital Stay: Payer: 59

## 2022-07-09 ENCOUNTER — Inpatient Hospital Stay: Payer: 59 | Admitting: Hematology & Oncology

## 2022-07-10 ENCOUNTER — Inpatient Hospital Stay: Payer: 59

## 2022-07-10 ENCOUNTER — Encounter: Payer: Self-pay | Admitting: Hematology & Oncology

## 2022-07-10 ENCOUNTER — Inpatient Hospital Stay (HOSPITAL_BASED_OUTPATIENT_CLINIC_OR_DEPARTMENT_OTHER): Payer: 59 | Admitting: Hematology & Oncology

## 2022-07-10 ENCOUNTER — Other Ambulatory Visit: Payer: Self-pay

## 2022-07-10 VITALS — BP 146/62 | HR 79 | Resp 18

## 2022-07-10 VITALS — BP 171/71 | HR 92 | Temp 97.7°F | Resp 18 | Ht 71.0 in | Wt 166.1 lb

## 2022-07-10 DIAGNOSIS — C9000 Multiple myeloma not having achieved remission: Secondary | ICD-10-CM

## 2022-07-10 DIAGNOSIS — Z5112 Encounter for antineoplastic immunotherapy: Secondary | ICD-10-CM | POA: Diagnosis not present

## 2022-07-10 DIAGNOSIS — E349 Endocrine disorder, unspecified: Secondary | ICD-10-CM

## 2022-07-10 DIAGNOSIS — Z299 Encounter for prophylactic measures, unspecified: Secondary | ICD-10-CM

## 2022-07-10 DIAGNOSIS — D631 Anemia in chronic kidney disease: Secondary | ICD-10-CM

## 2022-07-10 LAB — CBC WITH DIFFERENTIAL (CANCER CENTER ONLY)
Abs Immature Granulocytes: 0.08 10*3/uL — ABNORMAL HIGH (ref 0.00–0.07)
Basophils Absolute: 0.1 10*3/uL (ref 0.0–0.1)
Basophils Relative: 1 %
Eosinophils Absolute: 0.1 10*3/uL (ref 0.0–0.5)
Eosinophils Relative: 2 %
HCT: 29.4 % — ABNORMAL LOW (ref 39.0–52.0)
Hemoglobin: 9.4 g/dL — ABNORMAL LOW (ref 13.0–17.0)
Immature Granulocytes: 1 %
Lymphocytes Relative: 16 %
Lymphs Abs: 0.9 10*3/uL (ref 0.7–4.0)
MCH: 26 pg (ref 26.0–34.0)
MCHC: 32 g/dL (ref 30.0–36.0)
MCV: 81.4 fL (ref 80.0–100.0)
Monocytes Absolute: 0.6 10*3/uL (ref 0.1–1.0)
Monocytes Relative: 11 %
Neutro Abs: 4 10*3/uL (ref 1.7–7.7)
Neutrophils Relative %: 69 %
Platelet Count: 168 10*3/uL (ref 150–400)
RBC: 3.61 MIL/uL — ABNORMAL LOW (ref 4.22–5.81)
RDW: 17.2 % — ABNORMAL HIGH (ref 11.5–15.5)
WBC Count: 5.8 10*3/uL (ref 4.0–10.5)
nRBC: 0 % (ref 0.0–0.2)

## 2022-07-10 LAB — CMP (CANCER CENTER ONLY)
ALT: 29 U/L (ref 0–44)
AST: 31 U/L (ref 15–41)
Albumin: 4.1 g/dL (ref 3.5–5.0)
Alkaline Phosphatase: 72 U/L (ref 38–126)
Anion gap: 11 (ref 5–15)
BUN: 36 mg/dL — ABNORMAL HIGH (ref 8–23)
CO2: 32 mmol/L (ref 22–32)
Calcium: 8.9 mg/dL (ref 8.9–10.3)
Chloride: 99 mmol/L (ref 98–111)
Creatinine: 2.58 mg/dL — ABNORMAL HIGH (ref 0.61–1.24)
GFR, Estimated: 25 mL/min — ABNORMAL LOW (ref >60)
Glucose, Bld: 95 mg/dL (ref 70–99)
Potassium: 4 mmol/L (ref 3.5–5.1)
Sodium: 142 mmol/L (ref 135–145)
Total Bilirubin: 0.4 mg/dL (ref 0.3–1.2)
Total Protein: 6.6 g/dL (ref 6.5–8.1)

## 2022-07-10 LAB — LACTATE DEHYDROGENASE: LDH: 254 U/L — ABNORMAL HIGH (ref 98–192)

## 2022-07-10 LAB — FERRITIN: Ferritin: 893 ng/mL — ABNORMAL HIGH (ref 24–336)

## 2022-07-10 LAB — IRON AND IRON BINDING CAPACITY (CC-WL,HP ONLY)
Iron: 60 ug/dL (ref 45–182)
Saturation Ratios: 24 % (ref 17.9–39.5)
TIBC: 255 ug/dL (ref 250–450)
UIBC: 195 ug/dL (ref 117–376)

## 2022-07-10 MED ORDER — SODIUM CHLORIDE 0.9 % IV SOLN
300.0000 mg/m2 | Freq: Once | INTRAVENOUS | Status: AC
  Administered 2022-07-10: 580 mg via INTRAVENOUS
  Filled 2022-07-10: qty 29

## 2022-07-10 MED ORDER — SODIUM CHLORIDE 0.9 % IV SOLN: Freq: Once | INTRAVENOUS | Status: AC

## 2022-07-10 MED ORDER — SODIUM CHLORIDE 0.9 % IV SOLN
10.0000 mg | Freq: Once | INTRAVENOUS | Status: AC
  Administered 2022-07-10: 10 mg via INTRAVENOUS
  Filled 2022-07-10: qty 10

## 2022-07-10 MED ORDER — HYDROMORPHONE HCL 1 MG/ML IJ SOLN: 2.0000 mg | Freq: Once | INTRAMUSCULAR | Status: DC

## 2022-07-10 MED ORDER — PALONOSETRON HCL INJECTION 0.25 MG/5ML
0.2500 mg | Freq: Once | INTRAVENOUS | Status: AC
  Administered 2022-07-10: 0.25 mg via INTRAVENOUS
  Filled 2022-07-10: qty 5

## 2022-07-10 MED ORDER — SODIUM CHLORIDE 0.9% FLUSH
10.0000 mL | INTRAVENOUS | Status: DC | PRN
  Administered 2022-07-10: 10 mL

## 2022-07-10 MED ORDER — HYDROMORPHONE HCL 4 MG/ML IJ SOLN
2.0000 mg | Freq: Once | INTRAMUSCULAR | Status: AC
  Administered 2022-07-10: 2 mg via INTRAVENOUS
  Filled 2022-07-10: qty 1

## 2022-07-10 MED ORDER — CYANOCOBALAMIN 1000 MCG/ML IJ SOLN
1000.0000 ug | Freq: Once | INTRAMUSCULAR | Status: AC
  Administered 2022-07-10: 1000 ug via INTRAMUSCULAR
  Filled 2022-07-10: qty 1

## 2022-07-10 MED ORDER — DEXTROSE 5 % IV SOLN
36.0000 mg/m2 | Freq: Once | INTRAVENOUS | Status: AC
  Administered 2022-07-10: 70 mg via INTRAVENOUS
  Filled 2022-07-10: qty 30

## 2022-07-10 MED ORDER — HEPARIN SOD (PORK) LOCK FLUSH 100 UNIT/ML IV SOLN
500.0000 [IU] | Freq: Once | INTRAVENOUS | Status: AC | PRN
  Administered 2022-07-10: 500 [IU]

## 2022-07-10 MED ORDER — EPOETIN ALFA-EPBX 40000 UNIT/ML IJ SOLN
40000.0000 [IU] | Freq: Once | INTRAMUSCULAR | Status: AC
  Administered 2022-07-10: 40000 [IU] via SUBCUTANEOUS
  Filled 2022-07-10: qty 1

## 2022-07-10 NOTE — Progress Notes (Signed)
OK to treat with creatinine level from today per Dr. Ennever. 

## 2022-07-10 NOTE — Patient Instructions (Signed)

## 2022-07-10 NOTE — Patient Instructions (Signed)
[facility]  Discharge Instructions: Thank you for choosing [facility] to provide your oncology and hematology care.   If you have a lab appointment with the Cancer Center, please go directly to the Cancer Center and check in at the registration area.  Wear comfortable clothing and clothing appropriate for easy access to any Portacath or PICC line.   We strive to give you quality time with your provider. You may need to reschedule your appointment if you arrive late (15 or more minutes).  Arriving late affects you and other patients whose appointments are after yours.  Also, if you miss three or more appointments without notifying the office, you may be dismissed from the clinic at the provider's discretion.      For prescription refill requests, have your pharmacy contact our office and allow 72 hours for refills to be completed.    Today you received the following chemotherapy and/or immunotherapy agents Kyprolis, Cytoxan.      To help prevent nausea and vomiting after your treatment, we encourage you to take your nausea medication as directed.  BELOW ARE SYMPTOMS THAT SHOULD BE REPORTED IMMEDIATELY: *FEVER GREATER THAN 100.4 F (38 C) OR HIGHER *CHILLS OR SWEATING *NAUSEA AND VOMITING THAT IS NOT CONTROLLED WITH YOUR NAUSEA MEDICATION *UNUSUAL SHORTNESS OF BREATH *UNUSUAL BRUISING OR BLEEDING *URINARY PROBLEMS (pain or burning when urinating, or frequent urination) *BOWEL PROBLEMS (unusual diarrhea, constipation, pain near the anus) TENDERNESS IN MOUTH AND THROAT WITH OR WITHOUT PRESENCE OF ULCERS (sore throat, sores in mouth, or a toothache) UNUSUAL RASH, SWELLING OR PAIN  UNUSUAL VAGINAL DISCHARGE OR ITCHING   Items with * indicate a potential emergency and should be followed up as soon as possible or go to the Emergency Department if any problems should occur.  Please show the CHEMOTHERAPY ALERT CARD or IMMUNOTHERAPY ALERT CARD  at check-in to the Emergency Department and triage nurse. Should you have questions after your visit or need to cancel or reschedule your appointment, please contact [facility]  [phone] and follow the prompts.  Office hours are 8:00 a.m. to 4:30 p.m. Monday - Friday. Please note that voicemails left after 4:00 p.m. may not be returned until the following business day.  We are closed weekends and major holidays. You have access to a nurse at all times for urgent questions. Please call the main number to the clinic [phone] and follow the prompts.  For any non-urgent questions, you may also contact your provider using MyChart. We now offer e-Visits for anyone 18 and older to request care online for non-urgent symptoms. For details visit [email or web address].   Also download the MyChart app! Go to the app store, search "MyChart", open the app, select [facility], and log in with your MyChart username and password.   

## 2022-07-10 NOTE — Progress Notes (Signed)
Hematology and Oncology Follow Up Visit  [name] [id number] 02-09-1951 72 y.o. 07/10/2022   Principle Diagnosis:  IgG kappa myeloma Anemia secondary to renal insufficiency Intermittent iron - deficiency anemia Hypotestosteronemia   Current Therapy:        Aredia 60 mg IV q  month  -- on hold starting 03/05/2022 -- needs tooth extrated --tooth extracted 03/2022 Retacrit SQ as needed for hemoglobin less than 10 DepoTestosterone 400 mg q 4 weeks IV iron as indicated Kyprolis/Cytoxan - s/p cycle #37   Interim History:  [name] is here today for follow-up and treatment.  He has had a lot of lower back problems.  He has pain in the left leg.  We actually did do an MRI on him 2 days ago.  We will have to get the results.  They are not back yet.  He did go to the ER about a week ago.  Again is because of back pain.  He had plain x-rays done which showed degenerative changes.  I just feel bad that he is having these count issues.  I would not think that this is from myeloma given the fact that he has had a nice response and his levels really are not detectable.  Over last 6 myeloma levels in April, there is no monoclonal spike in his blood.  His IgG level was 760 mg/dL.  The Kappa light chain was 3.5 mg/dL.  He does have diabetes.  I think this to be under decent control.  He has had no problems with bowels or bladder.  Is been no incontinence.  He has had no fever.  He does get supplemental testosterone.  Again, when we last checked his testosterone level in April it was 208.  Currently, I would have to say that his performance status is probably ECOG 2.     Medications:  Allergies as of 07/10/2022       Reactions   Iodinated Contrast Media Rash   Patient states he was instructed not to take IV contrast.  In 2008 he had an unknown reaction, and was told not to take it again.  He was also told not to take it due to his kidneys.   Iodine Anxiety, Rash, Other (See Comments)    Didn't feel right "instructed not to take per MD--something with his port"        Medication List        Accurate as of Jul 10, 2022  8:33 AM. If you have any questions, ask your nurse or doctor.          amLODipine 10 MG tablet Commonly known as: NORVASC Take 10 mg by mouth daily.   ammonium lactate 12 % lotion Commonly known as: AmLactin Apply 1 application topically as needed for dry skin.   atorvastatin 40 MG tablet Commonly known as: LIPITOR Take 40 mg by mouth daily at 6 PM.   budesonide-formoterol 160-4.5 MCG/ACT inhaler Commonly known as: SYMBICORT SMARTSIG:2 Puff(s) By Mouth Twice Daily   Carafate 1 GM/10ML suspension Generic drug: sucralfate Take 1 g by mouth 4 (four) times daily.   carvedilol 25 MG tablet Commonly known as: COREG Take 25 mg by mouth 2 (two) times daily.   chlorpheniramine-HYDROcodone 10-8 MG/5ML Commonly known as: TUSSIONEX SMARTSIG:5 Milliliter(s) By Mouth Every 12 Hours   [employer] Fiber Gummies 2 g Chew Chew by mouth daily.   diazepam 5 MG tablet Commonly known as: VALIUM Take by mouth at bedtime as needed.   dicyclomine 10  MG capsule Commonly known as: BENTYL TAKE 1 CAPSULE BY MOUTH 3 TIMES A DAY BEFORE MEALS   dronabinol 5 MG capsule Commonly known as: MARINOL Take 1 capsule (5 mg total) by mouth 2 (two) times daily before a meal.   gabapentin 300 MG capsule Commonly known as: NEURONTIN TAKE 1 CAPSULE BY MOUTH THREE TIMES A DAY What changed:  how much to take how to take this when to take this additional instructions   glimepiride 2 MG tablet Commonly known as: AMARYL Take 2 mg by mouth daily.   hydrocortisone 2.5 % cream Apply topically 2 (two) times daily.   lactulose 10 GM/15ML solution Commonly known as: CHRONULAC Take 30 mLs (20 g total) by mouth daily.   latanoprost 0.005 % ophthalmic solution Commonly known as: XALATAN Place 1 drop into both eyes at bedtime.   linaclotide 145 MCG Caps  capsule Commonly known as: LINZESS Take by mouth daily as needed.   losartan 50 MG tablet Commonly known as: COZAAR Take 50 mg by mouth daily.   Lumigan 0.01 % Soln Generic drug: bimatoprost Place 1 drop into both eyes 3 (three) times daily.   mesalamine 1.2 g EC tablet Commonly known as: LIALDA Take by mouth daily with breakfast.   methocarbamol 500 MG tablet Commonly known as: ROBAXIN Take 1 tablet (500 mg total) by mouth 2 (two) times daily.   metoprolol succinate 25 MG 24 hr tablet Commonly known as: TOPROL-XL Take 25 mg by mouth daily.   mirabegron ER 25 MG Tb24 tablet Commonly known as: MYRBETRIQ Take 20 mg by mouth daily.   morphine 30 MG 12 hr tablet Commonly known as: MS CONTIN Take 30 mg by mouth 2 (two) times daily.   omeprazole 40 MG capsule Commonly known as: PRILOSEC TAKE 1 CAPSULE BY MOUTH TWICE A DAY   oxyCODONE 5 MG immediate release tablet Commonly known as: Roxicodone 1 tab PO q6 hours prn pain   oxyCODONE-acetaminophen 10-325 MG tablet Commonly known as: PERCOCET Take 1 tablet by mouth every 6 (six) hours as needed for pain.   pioglitazone 30 MG tablet Commonly known as: ACTOS Take 30 mg by mouth daily.   polyethylene glycol powder 17 GM/SCOOP powder Commonly known as: GLYCOLAX/MIRALAX SMARTSIG:1 scoopful By Mouth Twice Daily PRN   prochlorperazine 10 MG tablet Commonly known as: COMPAZINE take 1 tablet by mouth every 6 hours if needed for nausea and vomiting   Restasis 0.05 % ophthalmic emulsion Generic drug: cycloSPORINE Place 1 drop into both eyes daily.   SUMAtriptan 100 MG tablet Commonly known as: IMITREX Take 100 mg by mouth daily.   tadalafil 10 MG tablet Commonly known as: CIALIS Take by mouth.   topiramate 25 MG capsule Commonly known as: TOPAMAX Take 25 mg by mouth 2 (two) times daily.   topiramate 50 MG tablet Commonly known as: TOPAMAX Take 50 mg by mouth at bedtime.   torsemide 20 MG tablet Commonly known  as: DEMADEX Take 20 mg by mouth daily.   Trulicity 0.75 MG/0.5ML Sopn Generic drug: Dulaglutide Inject 0.75 mg into the skin every Saturday.   Vitamin D (Ergocalciferol) 1.25 MG (50000 UNIT) Caps capsule Commonly known as: DRISDOL Take 50,000 Units by mouth every Saturday.        Allergies:  Allergies  Allergen Reactions   Iodinated Contrast Media Rash    Patient states he was instructed not to take IV contrast.  In 2008 he had an unknown reaction, and was told not to take it again.  He was also told not to take it due to his kidneys.   Iodine Anxiety, Rash and Other (See Comments)    Didn't feel right "instructed not to take per MD--something with his port"     Past Medical History, Surgical history, Social history, and Family History were reviewed and updated.  Review of Systems: Review of Systems  Constitutional:  Positive for malaise/fatigue.  HENT: Negative.    Eyes: Negative.   Respiratory: Negative.    Cardiovascular: Negative.   Gastrointestinal:  Positive for nausea.  Genitourinary: Negative.   Musculoskeletal:  Positive for joint pain and myalgias.  Skin: Negative.   Neurological: Negative.   Endo/Heme/Allergies: Negative.   Psychiatric/Behavioral: Negative.       Physical Exam:  height is 5\' 11"  (1.803 m) and weight is 166 lb 1.9 oz (75.4 kg). His oral temperature is 97.7 F (36.5 C). His blood pressure is 171/71 (abnormal) and his pulse is 92. His respiration is 18 and oxygen saturation is 97%.   Wt Readings from Last 3 Encounters:  07/10/22 166 lb 1.9 oz (75.4 kg)  06/30/22 171 lb 1.2 oz (77.6 kg)  05/28/22 171 lb (77.6 kg)   Physical Exam Vitals reviewed.  HENT:     Head: Normocephalic and atraumatic.  Eyes:     Pupils: Pupils are equal, round, and reactive to light.  Cardiovascular:     Rate and Rhythm: Normal rate and regular rhythm.     Heart sounds: Normal heart sounds.  Pulmonary:     Effort: Pulmonary effort is normal.     Breath  sounds: Normal breath sounds.  Abdominal:     General: Bowel sounds are normal.     Palpations: Abdomen is soft.  Musculoskeletal:        General: No tenderness or deformity. Normal range of motion.     Cervical back: Normal range of motion.  Lymphadenopathy:     Cervical: No cervical adenopathy.  Skin:    General: Skin is warm and dry.     Findings: No erythema or rash.  Neurological:     Mental Status: He is alert and oriented to person, place, and time.  Psychiatric:        Behavior: Behavior normal.        Thought Content: Thought content normal.        Judgment: Judgment normal.       Lab Results  Component Value Date   WBC 5.8 07/10/2022   HGB 9.4 (L) 07/10/2022   HCT 29.4 (L) 07/10/2022   MCV 81.4 07/10/2022   PLT 168 07/10/2022   Lab Results  Component Value Date   FERRITIN 1,066 (H) 05/28/2022   IRON 77 05/28/2022   TIBC 230 (L) 05/28/2022   UIBC 153 05/28/2022   IRONPCTSAT 34 05/28/2022   Lab Results  Component Value Date   RETICCTPCT 0.9 04/16/2022   RBC 3.61 (L) 07/10/2022   RETICCTABS 27.5 03/21/2014   Lab Results  Component Value Date   KPAFRELGTCHN 35.0 (H) 05/28/2022   LAMBDASER 21.5 05/28/2022   KAPLAMBRATIO 1.63 05/28/2022   Lab Results  Component Value Date   IGGSERUM 716 05/28/2022   IGA 171 05/28/2022   IGMSERUM 26 05/28/2022   Lab Results  Component Value Date   TOTALPROTELP 5.9 (L) 05/28/2022   ALBUMINELP 3.6 05/28/2022   A1GS 0.2 05/28/2022   A2GS 0.7 05/28/2022   BETS 0.7 05/28/2022   BETA2SER 0.5 11/07/2014   GAMS 0.7 05/28/2022   MSPIKE Not Observed 05/28/2022  SPEI Comment 01/29/2022     Chemistry      Component Value Date/Time   NA 142 06/25/2022 0742   NA 143 02/22/2017 0744   K 3.7 06/25/2022 0742   K 4.0 02/22/2017 0744   CL 106 06/25/2022 0742   CL 106 02/22/2017 0744   CO2 27 06/25/2022 0742   CO2 27 02/22/2017 0744   BUN 40 (H) 06/25/2022 0742   BUN 26 (H) 02/22/2017 0744   CREATININE 2.59 (H)  06/25/2022 0742   CREATININE 2.3 (H) 02/22/2017 0744      Component Value Date/Time   CALCIUM 9.2 06/25/2022 0742   CALCIUM 9.3 02/22/2017 0744   ALKPHOS 75 06/25/2022 0742   ALKPHOS 80 02/22/2017 0744   AST 28 06/25/2022 0742   ALT 36 06/25/2022 0742   ALT 22 02/22/2017 0744   BILITOT 0.3 06/25/2022 0742       Impression and Plan: Mr. Brosnan is a very pleasant 72 yo African American gentleman with IgG kappa myeloma.  We have been doing this for many years.  So far, he is done incredibly well.  He is on a very good treatment protocol that he is tolerating well.  We really need to see what the MRI shows.  I think this will be incredibly important for [place].  It sounds like he may have spinal stenosis.  I would not think that this is myeloma related.  We will go ahead with his treatment today.  He will also get his testosterone today.  I will still hold off on the Aredia.  He had a tooth extracted back in February.  I will wait until June to do Radia.  He does need Aranesp.  Again, I think the MRI report will be helpful.  We will get him back to see [place] in another month.     Josph Macho, MD 5/17/20248:33 AM

## 2022-07-12 LAB — IGG, IGA, IGM
IgA: 168 mg/dL (ref 61–437)
IgG (Immunoglobin G), Serum: 781 mg/dL (ref 603–1613)
IgM (Immunoglobulin M), Srm: 26 mg/dL (ref 15–143)

## 2022-07-13 ENCOUNTER — Telehealth: Payer: Self-pay

## 2022-07-13 ENCOUNTER — Telehealth: Payer: Self-pay | Admitting: *Deleted

## 2022-07-13 DIAGNOSIS — M779 Enthesopathy, unspecified: Secondary | ICD-10-CM

## 2022-07-13 DIAGNOSIS — M1990 Unspecified osteoarthritis, unspecified site: Secondary | ICD-10-CM

## 2022-07-13 NOTE — Telephone Encounter (Signed)
Pt advised and states he does not have a spine doctor. Advised him we will send a referral to Dr Yevette Edwards. Pt agreed.

## 2022-07-13 NOTE — Telephone Encounter (Signed)
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  This is all arthritis.  There is no cancer.  He really needs to see a spine doctor.  Does he have a spine doctor?  If he does not, we can send him to Dr. Yevette Edwards.  Thanks.  Cindee Lame

## 2022-07-13 NOTE — Telephone Encounter (Signed)
Per Dr. Myna Hidalgo - faxed referral to Dr. Marshell Levan office @ [phone]

## 2022-07-14 LAB — PROTEIN ELECTROPHORESIS, SERUM, WITH REFLEX
A/G Ratio: 1.3 (ref 0.7–1.7)
Albumin ELP: 3.6 g/dL (ref 2.9–4.4)
Alpha-1-Globulin: 0.2 g/dL (ref 0.0–0.4)
Alpha-2-Globulin: 0.8 g/dL (ref 0.4–1.0)
Beta Globulin: 0.9 g/dL (ref 0.7–1.3)
Gamma Globulin: 0.7 g/dL (ref 0.4–1.8)
Globulin, Total: 2.7 g/dL (ref 2.2–3.9)
Total Protein ELP: 6.3 g/dL (ref 6.0–8.5)

## 2022-07-14 LAB — KAPPA/LAMBDA LIGHT CHAINS
Kappa free light chain: 35.4 mg/L — ABNORMAL HIGH (ref 3.3–19.4)
Kappa, lambda light chain ratio: 1.48 (ref 0.26–1.65)
Lambda free light chains: 24 mg/L (ref 5.7–26.3)

## 2022-07-23 ENCOUNTER — Inpatient Hospital Stay: Payer: 59

## 2022-07-23 DIAGNOSIS — D631 Anemia in chronic kidney disease: Secondary | ICD-10-CM

## 2022-07-23 DIAGNOSIS — Z299 Encounter for prophylactic measures, unspecified: Secondary | ICD-10-CM

## 2022-07-23 DIAGNOSIS — E349 Endocrine disorder, unspecified: Secondary | ICD-10-CM

## 2022-07-23 DIAGNOSIS — C9000 Multiple myeloma not having achieved remission: Secondary | ICD-10-CM

## 2022-07-23 DIAGNOSIS — Z5112 Encounter for antineoplastic immunotherapy: Secondary | ICD-10-CM | POA: Diagnosis not present

## 2022-07-23 LAB — CMP (CANCER CENTER ONLY)
ALT: 36 U/L (ref 0–44)
AST: 33 U/L (ref 15–41)
Albumin: 4.2 g/dL (ref 3.5–5.0)
Alkaline Phosphatase: 78 U/L (ref 38–126)
Anion gap: 10 (ref 5–15)
BUN: 45 mg/dL — ABNORMAL HIGH (ref 8–23)
CO2: 31 mmol/L (ref 22–32)
Calcium: 9.1 mg/dL (ref 8.9–10.3)
Chloride: 99 mmol/L (ref 98–111)
Creatinine: 2.32 mg/dL — ABNORMAL HIGH (ref 0.61–1.24)
GFR, Estimated: 29 mL/min — ABNORMAL LOW (ref >60)
Glucose, Bld: 70 mg/dL (ref 70–99)
Potassium: 4 mmol/L (ref 3.5–5.1)
Sodium: 140 mmol/L (ref 135–145)
Total Bilirubin: 0.5 mg/dL (ref 0.3–1.2)
Total Protein: 6.9 g/dL (ref 6.5–8.1)

## 2022-07-23 LAB — CBC WITH DIFFERENTIAL (CANCER CENTER ONLY)
Abs Immature Granulocytes: 0.04 10*3/uL (ref 0.00–0.07)
Basophils Absolute: 0.1 10*3/uL (ref 0.0–0.1)
Basophils Relative: 1 %
Eosinophils Absolute: 0.1 10*3/uL (ref 0.0–0.5)
Eosinophils Relative: 2 %
HCT: 33.7 % — ABNORMAL LOW (ref 39.0–52.0)
Hemoglobin: 10.7 g/dL — ABNORMAL LOW (ref 13.0–17.0)
Immature Granulocytes: 1 %
Lymphocytes Relative: 18 %
Lymphs Abs: 0.9 10*3/uL (ref 0.7–4.0)
MCH: 26 pg (ref 26.0–34.0)
MCHC: 31.8 g/dL (ref 30.0–36.0)
MCV: 82 fL (ref 80.0–100.0)
Monocytes Absolute: 0.6 10*3/uL (ref 0.1–1.0)
Monocytes Relative: 12 %
Neutro Abs: 3.2 10*3/uL (ref 1.7–7.7)
Neutrophils Relative %: 66 %
Platelet Count: 215 10*3/uL (ref 150–400)
RBC: 4.11 MIL/uL — ABNORMAL LOW (ref 4.22–5.81)
RDW: 17 % — ABNORMAL HIGH (ref 11.5–15.5)
WBC Count: 4.8 10*3/uL (ref 4.0–10.5)
nRBC: 0 % (ref 0.0–0.2)

## 2022-07-23 MED ORDER — SODIUM CHLORIDE 0.9 % IV SOLN
10.0000 mg | Freq: Once | INTRAVENOUS | Status: AC
  Administered 2022-07-23: 10 mg via INTRAVENOUS
  Filled 2022-07-23: qty 10

## 2022-07-23 MED ORDER — SODIUM CHLORIDE 0.9% FLUSH
10.0000 mL | INTRAVENOUS | Status: DC | PRN
  Administered 2022-07-23: 10 mL

## 2022-07-23 MED ORDER — PALONOSETRON HCL INJECTION 0.25 MG/5ML
0.2500 mg | Freq: Once | INTRAVENOUS | Status: AC
  Administered 2022-07-23: 0.25 mg via INTRAVENOUS
  Filled 2022-07-23: qty 5

## 2022-07-23 MED ORDER — HEPARIN SOD (PORK) LOCK FLUSH 100 UNIT/ML IV SOLN
500.0000 [IU] | Freq: Once | INTRAVENOUS | Status: AC | PRN
  Administered 2022-07-23: 500 [IU]

## 2022-07-23 MED ORDER — CYANOCOBALAMIN 1000 MCG/ML IJ SOLN
1000.0000 ug | Freq: Once | INTRAMUSCULAR | Status: AC
  Administered 2022-07-23: 1000 ug via INTRAMUSCULAR
  Filled 2022-07-23: qty 1

## 2022-07-23 MED ORDER — SODIUM CHLORIDE 0.9 % IV SOLN: Freq: Once | INTRAVENOUS | Status: AC

## 2022-07-23 MED ORDER — HYDROMORPHONE HCL 1 MG/ML IJ SOLN
2.0000 mg | Freq: Once | INTRAMUSCULAR | Status: AC
  Administered 2022-07-23: 2 mg via INTRAVENOUS
  Filled 2022-07-23: qty 2

## 2022-07-23 MED ORDER — DEXTROSE 5 % IV SOLN
36.0000 mg/m2 | Freq: Once | INTRAVENOUS | Status: AC
  Administered 2022-07-23: 70 mg via INTRAVENOUS
  Filled 2022-07-23: qty 30

## 2022-07-23 MED ORDER — TESTOSTERONE CYPIONATE 200 MG/ML IM SOLN
400.0000 mg | Freq: Once | INTRAMUSCULAR | Status: AC
  Administered 2022-07-23: 400 mg via INTRAMUSCULAR
  Filled 2022-07-23: qty 2

## 2022-07-23 MED ORDER — SODIUM CHLORIDE 0.9 % IV SOLN
300.0000 mg/m2 | Freq: Once | INTRAVENOUS | Status: AC
  Administered 2022-07-23: 580 mg via INTRAVENOUS
  Filled 2022-07-23: qty 29

## 2022-07-23 NOTE — Patient Instructions (Signed)

## 2022-07-23 NOTE — Patient Instructions (Signed)
[name]  Discharge Instructions: Thank you for choosing [facility] to provide your oncology and hematology care.   If you have a lab appointment with the Cancer Center, please go directly to the Cancer Center and check in at the registration area.  Wear comfortable clothing and clothing appropriate for easy access to any Portacath or PICC line.   We strive to give you quality time with your provider. You may need to reschedule your appointment if you arrive late (15 or more minutes).  Arriving late affects you and other patients whose appointments are after yours.  Also, if you miss three or more appointments without notifying the office, you may be dismissed from the clinic at the provider's discretion.      For prescription refill requests, have your pharmacy contact our office and allow 72 hours for refills to be completed.    Today you received the following chemotherapy and/or immunotherapy agents Kyprolis, Cytoxan.      To help prevent nausea and vomiting after your treatment, we encourage you to take your nausea medication as directed.  BELOW ARE SYMPTOMS THAT SHOULD BE REPORTED IMMEDIATELY: *FEVER GREATER THAN 100.4 F (38 C) OR HIGHER *CHILLS OR SWEATING *NAUSEA AND VOMITING THAT IS NOT CONTROLLED WITH YOUR NAUSEA MEDICATION *UNUSUAL SHORTNESS OF BREATH *UNUSUAL BRUISING OR BLEEDING *URINARY PROBLEMS (pain or burning when urinating, or frequent urination) *BOWEL PROBLEMS (unusual diarrhea, constipation, pain near the anus) TENDERNESS IN MOUTH AND THROAT WITH OR WITHOUT PRESENCE OF ULCERS (sore throat, sores in mouth, or a toothache) UNUSUAL RASH, SWELLING OR PAIN  UNUSUAL VAGINAL DISCHARGE OR ITCHING   Items with * indicate a potential emergency and should be followed up as soon as possible or go to the Emergency Department if any problems should occur.  Please show the CHEMOTHERAPY ALERT CARD or IMMUNOTHERAPY ALERT CARD  at check-in to the Emergency Department and triage nurse. Should you have questions after your visit or need to cancel or reschedule your appointment, please contact [facility]  [phone] and follow the prompts.  Office hours are 8:00 a.m. to 4:30 p.m. Monday - Friday. Please note that voicemails left after 4:00 p.m. may not be returned until the following business day.  We are closed weekends and major holidays. You have access to a nurse at all times for urgent questions. Please call the main number to the clinic [phone] and follow the prompts.  For any non-urgent questions, you may also contact your provider using MyChart. We now offer e-Visits for anyone 18 and older to request care online for non-urgent symptoms. For details visit [email or web address].   Also download the MyChart app! Go to the app store, search "MyChart", open the app, select [facility], and log in with your MyChart username and password.   

## 2022-07-23 NOTE — Progress Notes (Signed)
Okay to treat with SCr 2.32 per Dr. Myna Hidalgo.

## 2022-08-06 ENCOUNTER — Inpatient Hospital Stay: Payer: 59 | Attending: Hematology & Oncology

## 2022-08-06 ENCOUNTER — Inpatient Hospital Stay: Payer: 59

## 2022-08-06 VITALS — BP 172/69 | HR 95 | Resp 18

## 2022-08-06 DIAGNOSIS — N189 Chronic kidney disease, unspecified: Secondary | ICD-10-CM

## 2022-08-06 DIAGNOSIS — N184 Chronic kidney disease, stage 4 (severe): Secondary | ICD-10-CM | POA: Insufficient documentation

## 2022-08-06 DIAGNOSIS — E291 Testicular hypofunction: Secondary | ICD-10-CM | POA: Diagnosis not present

## 2022-08-06 DIAGNOSIS — D509 Iron deficiency anemia, unspecified: Secondary | ICD-10-CM | POA: Insufficient documentation

## 2022-08-06 DIAGNOSIS — Z79890 Hormone replacement therapy (postmenopausal): Secondary | ICD-10-CM | POA: Diagnosis not present

## 2022-08-06 DIAGNOSIS — E119 Type 2 diabetes mellitus without complications: Secondary | ICD-10-CM | POA: Insufficient documentation

## 2022-08-06 DIAGNOSIS — C9000 Multiple myeloma not having achieved remission: Secondary | ICD-10-CM | POA: Diagnosis present

## 2022-08-06 DIAGNOSIS — Z8639 Personal history of other endocrine, nutritional and metabolic disease: Secondary | ICD-10-CM | POA: Insufficient documentation

## 2022-08-06 DIAGNOSIS — M4712 Other spondylosis with myelopathy, cervical region: Secondary | ICD-10-CM

## 2022-08-06 DIAGNOSIS — D631 Anemia in chronic kidney disease: Secondary | ICD-10-CM | POA: Diagnosis not present

## 2022-08-06 DIAGNOSIS — Z5111 Encounter for antineoplastic chemotherapy: Secondary | ICD-10-CM | POA: Insufficient documentation

## 2022-08-06 DIAGNOSIS — Z5112 Encounter for antineoplastic immunotherapy: Secondary | ICD-10-CM | POA: Insufficient documentation

## 2022-08-06 DIAGNOSIS — Z79899 Other long term (current) drug therapy: Secondary | ICD-10-CM | POA: Insufficient documentation

## 2022-08-06 DIAGNOSIS — Z299 Encounter for prophylactic measures, unspecified: Secondary | ICD-10-CM

## 2022-08-06 DIAGNOSIS — E349 Endocrine disorder, unspecified: Secondary | ICD-10-CM

## 2022-08-06 LAB — CMP (CANCER CENTER ONLY)
ALT: 19 U/L (ref 0–44)
AST: 25 U/L (ref 15–41)
Albumin: 3.8 g/dL (ref 3.5–5.0)
Alkaline Phosphatase: 68 U/L (ref 38–126)
Anion gap: 9 (ref 5–15)
BUN: 23 mg/dL (ref 8–23)
CO2: 30 mmol/L (ref 22–32)
Calcium: 8.7 mg/dL — ABNORMAL LOW (ref 8.9–10.3)
Chloride: 101 mmol/L (ref 98–111)
Creatinine: 2.22 mg/dL — ABNORMAL HIGH (ref 0.61–1.24)
GFR, Estimated: 31 mL/min — ABNORMAL LOW (ref >60)
Glucose, Bld: 135 mg/dL — ABNORMAL HIGH (ref 70–99)
Potassium: 3.6 mmol/L (ref 3.5–5.1)
Sodium: 140 mmol/L (ref 135–145)
Total Bilirubin: 0.6 mg/dL (ref 0.3–1.2)
Total Protein: 6.2 g/dL — ABNORMAL LOW (ref 6.5–8.1)

## 2022-08-06 LAB — CBC WITH DIFFERENTIAL (CANCER CENTER ONLY)
Abs Immature Granulocytes: 0.04 10*3/uL (ref 0.00–0.07)
Basophils Absolute: 0.1 10*3/uL (ref 0.0–0.1)
Basophils Relative: 2 %
Eosinophils Absolute: 0.1 10*3/uL (ref 0.0–0.5)
Eosinophils Relative: 3 %
HCT: 29.6 % — ABNORMAL LOW (ref 39.0–52.0)
Hemoglobin: 9.5 g/dL — ABNORMAL LOW (ref 13.0–17.0)
Immature Granulocytes: 1 %
Lymphocytes Relative: 18 %
Lymphs Abs: 0.9 10*3/uL (ref 0.7–4.0)
MCH: 26 pg (ref 26.0–34.0)
MCHC: 32.1 g/dL (ref 30.0–36.0)
MCV: 81.1 fL (ref 80.0–100.0)
Monocytes Absolute: 0.6 10*3/uL (ref 0.1–1.0)
Monocytes Relative: 13 %
Neutro Abs: 3.1 10*3/uL (ref 1.7–7.7)
Neutrophils Relative %: 63 %
Platelet Count: 178 10*3/uL (ref 150–400)
RBC: 3.65 MIL/uL — ABNORMAL LOW (ref 4.22–5.81)
RDW: 16.3 % — ABNORMAL HIGH (ref 11.5–15.5)
WBC Count: 4.8 10*3/uL (ref 4.0–10.5)
nRBC: 0 % (ref 0.0–0.2)

## 2022-08-06 MED ORDER — SODIUM CHLORIDE 0.9 % IV SOLN
300.0000 mg/m2 | Freq: Once | INTRAVENOUS | Status: AC
  Administered 2022-08-06: 580 mg via INTRAVENOUS
  Filled 2022-08-06: qty 29

## 2022-08-06 MED ORDER — HYDROMORPHONE HCL 1 MG/ML IJ SOLN
2.0000 mg | Freq: Once | INTRAMUSCULAR | Status: AC
  Administered 2022-08-06: 2 mg via INTRAVENOUS
  Filled 2022-08-06: qty 2

## 2022-08-06 MED ORDER — HEPARIN SOD (PORK) LOCK FLUSH 100 UNIT/ML IV SOLN
500.0000 [IU] | Freq: Once | INTRAVENOUS | Status: AC | PRN
  Administered 2022-08-06: 500 [IU]

## 2022-08-06 MED ORDER — CYANOCOBALAMIN 1000 MCG/ML IJ SOLN
1000.0000 ug | Freq: Once | INTRAMUSCULAR | Status: AC
  Administered 2022-08-06: 1000 ug via INTRAMUSCULAR
  Filled 2022-08-06: qty 1

## 2022-08-06 MED ORDER — SODIUM CHLORIDE 0.9% FLUSH
10.0000 mL | INTRAVENOUS | Status: DC | PRN
  Administered 2022-08-06: 10 mL

## 2022-08-06 MED ORDER — SODIUM CHLORIDE 0.9 % IV SOLN
10.0000 mg | Freq: Once | INTRAVENOUS | Status: AC
  Administered 2022-08-06: 10 mg via INTRAVENOUS
  Filled 2022-08-06: qty 10

## 2022-08-06 MED ORDER — TESTOSTERONE CYPIONATE 200 MG/ML IM SOLN
400.0000 mg | Freq: Once | INTRAMUSCULAR | Status: AC
  Administered 2022-08-06: 400 mg via INTRAMUSCULAR
  Filled 2022-08-06: qty 2

## 2022-08-06 MED ORDER — DEXTROSE 5 % IV SOLN
36.0000 mg/m2 | Freq: Once | INTRAVENOUS | Status: AC
  Administered 2022-08-06: 70 mg via INTRAVENOUS
  Filled 2022-08-06: qty 30

## 2022-08-06 MED ORDER — EPOETIN ALFA-EPBX 40000 UNIT/ML IJ SOLN
40000.0000 [IU] | Freq: Once | INTRAMUSCULAR | Status: AC
  Administered 2022-08-06: 40000 [IU] via SUBCUTANEOUS
  Filled 2022-08-06: qty 1

## 2022-08-06 MED ORDER — SODIUM CHLORIDE 0.9 % IV SOLN: Freq: Once | INTRAVENOUS | Status: AC

## 2022-08-06 MED ORDER — SODIUM CHLORIDE 0.9 % IV SOLN
60.0000 mg | Freq: Once | INTRAVENOUS | Status: AC
  Administered 2022-08-06: 60 mg via INTRAVENOUS
  Filled 2022-08-06: qty 20

## 2022-08-06 MED ORDER — PALONOSETRON HCL INJECTION 0.25 MG/5ML
0.2500 mg | Freq: Once | INTRAVENOUS | Status: AC
  Administered 2022-08-06: 0.25 mg via INTRAVENOUS
  Filled 2022-08-06: qty 5

## 2022-08-06 NOTE — Progress Notes (Signed)
OK to treat with today's creatinine level per Dr. Myna Hidalgo. Patient wanted to leave.OK to increase Aredia rate to 260 ml/hr for the last 60 ml of the infusion per Dr. Jana Half.

## 2022-08-06 NOTE — Patient Instructions (Signed)
[facility]  Discharge Instructions: Thank you for choosing [facility] to provide your oncology and hematology care.   If you have a lab appointment with the Cancer Center, please go directly to the Cancer Center and check in at the registration area.  Wear comfortable clothing and clothing appropriate for easy access to any Portacath or PICC line.   We strive to give you quality time with your provider. You may need to reschedule your appointment if you arrive late (15 or more minutes).  Arriving late affects you and other patients whose appointments are after yours.  Also, if you miss three or more appointments without notifying the office, you may be dismissed from the clinic at the provider's discretion.      For prescription refill requests, have your pharmacy contact our office and allow 72 hours for refills to be completed.    Today you received the following chemotherapy and/or immunotherapy agents Kyprolis, Cytoxan.      To help prevent nausea and vomiting after your treatment, we encourage you to take your nausea medication as directed.  BELOW ARE SYMPTOMS THAT SHOULD BE REPORTED IMMEDIATELY: *FEVER GREATER THAN 100.4 F (38 C) OR HIGHER *CHILLS OR SWEATING *NAUSEA AND VOMITING THAT IS NOT CONTROLLED WITH YOUR NAUSEA MEDICATION *UNUSUAL SHORTNESS OF BREATH *UNUSUAL BRUISING OR BLEEDING *URINARY PROBLEMS (pain or burning when urinating, or frequent urination) *BOWEL PROBLEMS (unusual diarrhea, constipation, pain near the anus) TENDERNESS IN MOUTH AND THROAT WITH OR WITHOUT PRESENCE OF ULCERS (sore throat, sores in mouth, or a toothache) UNUSUAL RASH, SWELLING OR PAIN  UNUSUAL VAGINAL DISCHARGE OR ITCHING   Items with * indicate a potential emergency and should be followed up as soon as possible or go to the Emergency Department if any problems should occur.  Please show the CHEMOTHERAPY ALERT CARD or IMMUNOTHERAPY ALERT CARD  at check-in to the Emergency Department and triage nurse. Should you have questions after your visit or need to cancel or reschedule your appointment, please contact [facility]  [phone] and follow the prompts.  Office hours are 8:00 a.m. to 4:30 p.m. Monday - Friday. Please note that voicemails left after 4:00 p.m. may not be returned until the following business day.  We are closed weekends and major holidays. You have access to a nurse at all times for urgent questions. Please call the main number to the clinic [phone] and follow the prompts.  For any non-urgent questions, you may also contact your provider using MyChart. We now offer e-Visits for anyone 62 and older to request care online for non-urgent symptoms. For details visit [email or web address].   Also download the MyChart app! Go to the app store, search "MyChart", open the app, select [facility], and log in with your MyChart username and password. Pamidronate Injection What is this medication? PAMIDRONATE (pa mi DROE nate) treats high calcium levels in the blood caused by cancer. It may also be used with chemotherapy to treat weakened bones caused by cancer. It can also be used to treat Paget's disease of the bone. It works by slowing down the release of calcium from bones. This lowers calcium levels in your blood. It also makes your bones stronger and less likely to break (fracture). It belongs to a group of medications called bisphosphonates. This medicine may be used for other purposes; ask your health care provider or pharmacist if you have questions. COMMON BRAND NAME(S): Aredia What should I  tell my care team before I take this medication? They need to know if you have any of these conditions: Bleeding disorder Cancer Dental disease Kidney disease Low levels of calcium or other minerals in the blood Low red blood cell counts Receiving steroids, such as dexamethasone or  prednisone An unusual or allergic reaction to pamidronate, other medications, foods, dyes or preservatives Pregnant or trying to get pregnant Breast-feeding How should I use this medication? This medication is injected into a vein. It is given by your care team in a hospital or clinic setting. Talk to your care team about the use of this medication in children. Special care may be needed. Overdosage: If you think you have taken too much of this medicine contact a poison control center or emergency room at once. NOTE: This medicine is only for you. Do not share this medicine with others. What if I miss a dose? Keep appointments for follow-up doses. It is important not to miss your dose. Call your care team if you are unable to keep an appointment. What may interact with this medication? Certain antibiotics given by injection Medications for inflammation or pain, such as ibuprofen, naproxen Some diuretics, such as bumetanide, furosemide Cyclosporine Parathyroid hormone Tacrolimus Teriparatide Thalidomide This list may not describe all possible interactions. Give your health care provider a list of all the medicines, herbs, non-prescription drugs, or dietary supplements you use. Also tell them if you smoke, drink alcohol, or use illegal drugs. Some items may interact with your medicine. What should I watch for while using this medication? Visit your care team for regular checks on your progress. It may be some time before you see the benefit from this medication. Some people who take this medication have severe bone, joint, or muscle pain. This medication may also increase your risk for jaw problems or a broken thigh bone. Tell your care team right away if you have severe pain in your jaw, bones, joints, or muscles. Tell your care team if you have any pain that does not go away or that gets worse. Tell your dentist and dental surgeon that you are taking this medication. You should not have major  dental surgery while on this medication. See your dentist to have a dental exam and fix any dental problems before starting this medication. Take good care of your teeth while on this medication. Make sure you see your dentist for regular follow-up appointments. You should make sure you get enough calcium and vitamin D while you are taking this medication. Discuss the foods you eat and the vitamins you take with your care team. You may need bloodwork while you are taking this medication. Talk to your care team if you wish to become pregnant or think you might be pregnant. This medication can cause serious birth defects. What side effects may I notice from receiving this medication? Side effects that you should report to your care team as soon as possible: Allergic reactions--skin rash, itching, hives, swelling of the face, lips, tongue, or throat Kidney injury--decrease in the amount of urine, swelling of the ankles, hands, or feet Low calcium level--muscle pain or cramps, confusion, tingling, or numbness in the hands or feet Osteonecrosis of the jaw--pain, swelling, or redness in the mouth, numbness of the jaw, poor healing after dental work, unusual discharge from the mouth, visible bones in the mouth Severe bone, joint, or muscle pain Side effects that usually do not require medical attention (report to your care team if they continue or  are bothersome): Constipation Fatigue Fever Loss of appetite Nausea Pain, redness, or irritation at injection site Stomach pain This list may not describe all possible side effects. Call your doctor for medical advice about side effects. You may report side effects to FDA at 1-800-FDA-1088. Where should I keep my medication? This medication is given in a hospital or clinic. It will not be stored at home. NOTE: This sheet is a summary. It may not cover all possible information. If you have questions about this medicine, talk to your doctor, pharmacist, or  health care provider.  2024 Elsevier/Gold Standard (2021-03-31 00:00:00)

## 2022-08-07 ENCOUNTER — Other Ambulatory Visit: Payer: Self-pay

## 2022-08-13 ENCOUNTER — Other Ambulatory Visit: Payer: Self-pay

## 2022-08-14 ENCOUNTER — Encounter: Payer: Self-pay | Admitting: Podiatry

## 2022-08-14 ENCOUNTER — Ambulatory Visit (INDEPENDENT_AMBULATORY_CARE_PROVIDER_SITE_OTHER): Payer: 59 | Admitting: Podiatry

## 2022-08-14 DIAGNOSIS — B351 Tinea unguium: Secondary | ICD-10-CM | POA: Diagnosis not present

## 2022-08-14 DIAGNOSIS — L84 Corns and callosities: Secondary | ICD-10-CM

## 2022-08-14 DIAGNOSIS — M79674 Pain in right toe(s): Secondary | ICD-10-CM | POA: Diagnosis not present

## 2022-08-14 DIAGNOSIS — M79675 Pain in left toe(s): Secondary | ICD-10-CM | POA: Diagnosis not present

## 2022-08-14 DIAGNOSIS — E1151 Type 2 diabetes mellitus with diabetic peripheral angiopathy without gangrene: Secondary | ICD-10-CM | POA: Diagnosis not present

## 2022-08-14 NOTE — Progress Notes (Signed)
This patient returns to my office for at risk foot care.  This patient requires this care by a professional since this patient will be at risk due to having diabetes type 2.  This patient is unable to cut nails himself since the patient cannot reach his nails.These nails are painful walking and wearing shoes.  Painful callus under outside ball of both feet.This patient presents for at risk foot care today.  General Appearance  Alert, conversant and in no acute stress.  Vascular  Dorsalis pedis and posterior tibial  pulses are weakly  palpable  bilaterally.  Capillary return is within normal limits  bilaterally. Cold feet bilaterally.  Absent digital hair.  Neurologic  Senn-Weinstein monofilament wire test within normal limits/diminished right.  LOPS absent left foot. Muscle power within normal limits bilaterally.  Nails Thick disfigured discolored nails with subungual debris  from hallux to fifth toes bilaterally. No evidence of bacterial infection or drainage bilaterally.  Orthopedic  No limitations of motion  feet .  No crepitus or effusions noted.  No bony pathology or digital deformities noted.  HAV  B/L.  Exostosis right foot.Plantar flexed fifth metatarsall B/l.  Skin  normotropic skin with no porokeratosis noted bilaterally.  No signs of infections or ulcers noted.  Callus sub 5th met  B/l. No infection.  Onychomycosis  Pain in right toes  Pain in left toes  Porokeratosis  B/L  Consent was obtained for treatment procedures.   Mechanical debridement of nails 1-5  bilaterally performed with a nail nipper.  Filed with dremel without incident. Debride callus sub 5th  B/L with # 15 blade and dremel tool.  Callus done as a courtesy.   Return office visit    10 weeks                  Told patient to return for periodic foot care and evaluation due to potential at risk complications.   Helane Gunther DPM

## 2022-08-20 ENCOUNTER — Inpatient Hospital Stay: Payer: 59

## 2022-08-20 ENCOUNTER — Encounter: Payer: Self-pay | Admitting: Hematology & Oncology

## 2022-08-20 ENCOUNTER — Inpatient Hospital Stay (HOSPITAL_BASED_OUTPATIENT_CLINIC_OR_DEPARTMENT_OTHER): Payer: 59 | Admitting: Hematology & Oncology

## 2022-08-20 VITALS — BP 156/68 | HR 91 | Temp 99.0°F | Resp 18 | Ht 71.0 in

## 2022-08-20 DIAGNOSIS — C9000 Multiple myeloma not having achieved remission: Secondary | ICD-10-CM | POA: Diagnosis not present

## 2022-08-20 DIAGNOSIS — E349 Endocrine disorder, unspecified: Secondary | ICD-10-CM

## 2022-08-20 DIAGNOSIS — M4712 Other spondylosis with myelopathy, cervical region: Secondary | ICD-10-CM

## 2022-08-20 DIAGNOSIS — Z299 Encounter for prophylactic measures, unspecified: Secondary | ICD-10-CM

## 2022-08-20 DIAGNOSIS — D631 Anemia in chronic kidney disease: Secondary | ICD-10-CM

## 2022-08-20 DIAGNOSIS — Z5112 Encounter for antineoplastic immunotherapy: Secondary | ICD-10-CM | POA: Diagnosis not present

## 2022-08-20 LAB — CBC WITH DIFFERENTIAL (CANCER CENTER ONLY)
Abs Immature Granulocytes: 0.03 10*3/uL (ref 0.00–0.07)
Basophils Absolute: 0.1 10*3/uL (ref 0.0–0.1)
Basophils Relative: 1 %
Eosinophils Absolute: 0.1 10*3/uL (ref 0.0–0.5)
Eosinophils Relative: 3 %
HCT: 30.8 % — ABNORMAL LOW (ref 39.0–52.0)
Hemoglobin: 9.8 g/dL — ABNORMAL LOW (ref 13.0–17.0)
Immature Granulocytes: 1 %
Lymphocytes Relative: 16 %
Lymphs Abs: 0.7 10*3/uL (ref 0.7–4.0)
MCH: 25.9 pg — ABNORMAL LOW (ref 26.0–34.0)
MCHC: 31.8 g/dL (ref 30.0–36.0)
MCV: 81.3 fL (ref 80.0–100.0)
Monocytes Absolute: 0.6 10*3/uL (ref 0.1–1.0)
Monocytes Relative: 14 %
Neutro Abs: 3 10*3/uL (ref 1.7–7.7)
Neutrophils Relative %: 65 %
Platelet Count: 210 10*3/uL (ref 150–400)
RBC: 3.79 MIL/uL — ABNORMAL LOW (ref 4.22–5.81)
RDW: 17.1 % — ABNORMAL HIGH (ref 11.5–15.5)
WBC Count: 4.6 10*3/uL (ref 4.0–10.5)
nRBC: 0 % (ref 0.0–0.2)

## 2022-08-20 LAB — CMP (CANCER CENTER ONLY)
ALT: 15 U/L (ref 0–44)
AST: 19 U/L (ref 15–41)
Albumin: 3.8 g/dL (ref 3.5–5.0)
Alkaline Phosphatase: 79 U/L (ref 38–126)
Anion gap: 10 (ref 5–15)
BUN: 21 mg/dL (ref 8–23)
CO2: 27 mmol/L (ref 22–32)
Calcium: 8 mg/dL — ABNORMAL LOW (ref 8.9–10.3)
Chloride: 104 mmol/L (ref 98–111)
Creatinine: 2.53 mg/dL — ABNORMAL HIGH (ref 0.61–1.24)
GFR, Estimated: 26 mL/min — ABNORMAL LOW (ref >60)
Glucose, Bld: 162 mg/dL — ABNORMAL HIGH (ref 70–99)
Potassium: 3.3 mmol/L — ABNORMAL LOW (ref 3.5–5.1)
Sodium: 141 mmol/L (ref 135–145)
Total Bilirubin: 0.5 mg/dL (ref 0.3–1.2)
Total Protein: 5.9 g/dL — ABNORMAL LOW (ref 6.5–8.1)

## 2022-08-20 MED ORDER — CYANOCOBALAMIN 1000 MCG/ML IJ SOLN
1000.0000 ug | Freq: Once | INTRAMUSCULAR | Status: AC
  Administered 2022-08-20: 1000 ug via INTRAMUSCULAR
  Filled 2022-08-20: qty 1

## 2022-08-20 MED ORDER — EPOETIN ALFA-EPBX 40000 UNIT/ML IJ SOLN
40000.0000 [IU] | Freq: Once | INTRAMUSCULAR | Status: AC
  Administered 2022-08-20: 40000 [IU] via SUBCUTANEOUS
  Filled 2022-08-20: qty 1

## 2022-08-20 MED ORDER — SODIUM CHLORIDE 0.9 % IV SOLN
300.0000 mg/m2 | Freq: Once | INTRAVENOUS | Status: AC
  Administered 2022-08-20: 580 mg via INTRAVENOUS
  Filled 2022-08-20: qty 29

## 2022-08-20 MED ORDER — DEXTROSE 5 % IV SOLN
36.0000 mg/m2 | Freq: Once | INTRAVENOUS | Status: AC
  Administered 2022-08-20: 70 mg via INTRAVENOUS
  Filled 2022-08-20: qty 30

## 2022-08-20 MED ORDER — HYDROMORPHONE HCL 1 MG/ML IJ SOLN
2.0000 mg | Freq: Once | INTRAMUSCULAR | Status: AC
  Administered 2022-08-20: 2 mg via INTRAVENOUS
  Filled 2022-08-20: qty 2

## 2022-08-20 MED ORDER — HEPARIN SOD (PORK) LOCK FLUSH 100 UNIT/ML IV SOLN
500.0000 [IU] | Freq: Once | INTRAVENOUS | Status: AC | PRN
  Administered 2022-08-20: 500 [IU]

## 2022-08-20 MED ORDER — SODIUM CHLORIDE 0.9 % IV SOLN
10.0000 mg | Freq: Once | INTRAVENOUS | Status: AC
  Administered 2022-08-20: 10 mg via INTRAVENOUS
  Filled 2022-08-20: qty 10

## 2022-08-20 MED ORDER — SODIUM CHLORIDE 0.9 % IV SOLN: 60.0000 mg | Freq: Once | INTRAVENOUS | Status: DC

## 2022-08-20 MED ORDER — PALONOSETRON HCL INJECTION 0.25 MG/5ML
0.2500 mg | Freq: Once | INTRAVENOUS | Status: AC
  Administered 2022-08-20: 0.25 mg via INTRAVENOUS
  Filled 2022-08-20: qty 5

## 2022-08-20 MED ORDER — SODIUM CHLORIDE 0.9% FLUSH
10.0000 mL | INTRAVENOUS | Status: DC | PRN
  Administered 2022-08-20: 10 mL

## 2022-08-20 MED ORDER — SODIUM CHLORIDE 0.9 % IV SOLN: Freq: Once | INTRAVENOUS | Status: AC

## 2022-08-20 NOTE — Progress Notes (Signed)
Ok to treat with creatinine of 2.53 per Dr Myna Hidalgo. dph

## 2022-08-20 NOTE — Progress Notes (Signed)
Hematology and Oncology Follow Up Visit  [name] [id number] 12-04-1950 72 y.o. 08/20/2022   Principle Diagnosis:  IgG kappa myeloma Anemia secondary to renal insufficiency Intermittent iron - deficiency anemia Hypotestosteronemia   Current Therapy:        Aredia 60 mg IV q  month  -- on hold starting 03/05/2022 -- needs tooth extrated --tooth extracted 03/2022 -started on 07/2022 Retacrit SQ as needed for hemoglobin less than 10 DepoTestosterone 400 mg q 4 weeks IV iron as indicated Kyprolis/Cytoxan - s/p cycle #38   Interim History:  [name] is here today for follow-up and treatment.  He did have the MRI of his lumbar spine back on 07/07/2022.  There is no evidence of myeloma.  However, he has a lot of degenerative changes.  He has a lot of spurring.  There is some fusion of his vertebral bodies now.  He still is having the back discomfort.  I suspect this will always be a problem.  His myeloma studies have been fantastic.  There is no monoclonal spike found in his blood back on 07/10/2022.  His IgG level was 781 mg/dL.  The Kappa light chain was 3.5 mg/dL.   He does have diabetes.  I think this to be under decent control.  He has had no problems with bowels or bladder.  Is been no incontinence.  He has had no fever.  He does get supplemental testosterone.  Again, when we last checked his testosterone level in April it was 208.  Currently, I would have to say that his performance status is probably ECOG 2.     Medications:  Allergies as of 08/20/2022       Reactions   Iodinated Contrast Media Rash   Patient states he was instructed not to take IV contrast.  In 2008 he had an unknown reaction, and was told not to take it again.  He was also told not to take it due to his kidneys.   Iodine Anxiety, Rash, Other (See Comments)   Didn't feel right "instructed not to take per MD--something with his port"        Medication List        Accurate as of August 20, 2022   8:41 AM. If you have any questions, ask your nurse or doctor.          STOP taking these medications    tadalafil 10 MG tablet Commonly known as: CIALIS Stopped by: Josph Macho, MD       TAKE these medications    amLODipine 10 MG tablet Commonly known as: NORVASC Take 10 mg by mouth daily.   ammonium lactate 12 % lotion Commonly known as: AmLactin Apply 1 application topically as needed for dry skin.   atorvastatin 40 MG tablet Commonly known as: LIPITOR Take 40 mg by mouth daily at 6 PM.   budesonide-formoterol 160-4.5 MCG/ACT inhaler Commonly known as: SYMBICORT SMARTSIG:2 Puff(s) By Mouth Twice Daily   Carafate 1 GM/10ML suspension Generic drug: sucralfate Take 1 g by mouth 4 (four) times daily.   carvedilol 25 MG tablet Commonly known as: COREG Take 25 mg by mouth 2 (two) times daily.   chlorpheniramine-HYDROcodone 10-8 MG/5ML Commonly known as: TUSSIONEX SMARTSIG:5 Milliliter(s) By Mouth Every 12 Hours   [employer] Fiber Gummies 2 g Chew Chew by mouth daily.   diazepam 5 MG tablet Commonly known as: VALIUM Take by mouth at bedtime as needed.   diclofenac Sodium 1 % Gel Commonly known as: VOLTAREN  Apply topically.   dicyclomine 10 MG capsule Commonly known as: BENTYL TAKE 1 CAPSULE BY MOUTH 3 TIMES A DAY BEFORE MEALS   dronabinol 5 MG capsule Commonly known as: MARINOL Take 1 capsule (5 mg total) by mouth 2 (two) times daily before a meal.   gabapentin 300 MG capsule Commonly known as: NEURONTIN TAKE 1 CAPSULE BY MOUTH THREE TIMES A DAY What changed:  how much to take how to take this when to take this additional instructions   glimepiride 2 MG tablet Commonly known as: AMARYL Take 2 mg by mouth daily.   hydrocortisone 2.5 % cream Apply topically 2 (two) times daily.   Klor-Con M20 20 MEQ tablet Generic drug: potassium chloride SA Take 20 mEq by mouth daily.   lactulose 10 GM/15ML solution Commonly known as: CHRONULAC Take 30 mLs  (20 g total) by mouth daily.   latanoprost 0.005 % ophthalmic solution Commonly known as: XALATAN Place 1 drop into both eyes at bedtime.   linaclotide 145 MCG Caps capsule Commonly known as: LINZESS Take by mouth daily as needed.   losartan 50 MG tablet Commonly known as: COZAAR Take 50 mg by mouth daily.   Lumigan 0.01 % Soln Generic drug: bimatoprost Place 1 drop into both eyes 3 (three) times daily.   mesalamine 1.2 g EC tablet Commonly known as: LIALDA Take by mouth daily with breakfast.   methocarbamol 500 MG tablet Commonly known as: ROBAXIN Take 1 tablet (500 mg total) by mouth 2 (two) times daily.   metoprolol succinate 25 MG 24 hr tablet Commonly known as: TOPROL-XL Take 25 mg by mouth daily.   mirabegron ER 25 MG Tb24 tablet Commonly known as: MYRBETRIQ Take 20 mg by mouth daily.   morphine 30 MG 12 hr tablet Commonly known as: MS CONTIN Take 30 mg by mouth 2 (two) times daily.   omeprazole 40 MG capsule Commonly known as: PRILOSEC TAKE 1 CAPSULE BY MOUTH TWICE A DAY   oxyCODONE 5 MG immediate release tablet Commonly known as: Roxicodone 1 tab PO q6 hours prn pain   oxyCODONE-acetaminophen 10-325 MG tablet Commonly known as: PERCOCET Take 1 tablet by mouth every 6 (six) hours as needed for pain.   pioglitazone 30 MG tablet Commonly known as: ACTOS Take 30 mg by mouth daily.   polyethylene glycol powder 17 GM/SCOOP powder Commonly known as: GLYCOLAX/MIRALAX SMARTSIG:1 scoopful By Mouth Twice Daily PRN   prochlorperazine 10 MG tablet Commonly known as: COMPAZINE take 1 tablet by mouth every 6 hours if needed for nausea and vomiting   Restasis 0.05 % ophthalmic emulsion Generic drug: cycloSPORINE Place 1 drop into both eyes daily.   sildenafil 100 MG tablet Commonly known as: VIAGRA SMARTSIG:1 Tablet(s) By Mouth   SUMAtriptan 100 MG tablet Commonly known as: IMITREX Take 100 mg by mouth daily.   topiramate 25 MG capsule Commonly known  as: TOPAMAX Take 25 mg by mouth 2 (two) times daily.   topiramate 50 MG tablet Commonly known as: TOPAMAX Take 50 mg by mouth at bedtime.   torsemide 20 MG tablet Commonly known as: DEMADEX Take 20 mg by mouth daily.   Trulicity 0.75 MG/0.5ML Sopn Generic drug: Dulaglutide Inject 0.75 mg into the skin every Saturday.   Vitamin D (Ergocalciferol) 1.25 MG (50000 UNIT) Caps capsule Commonly known as: DRISDOL Take 50,000 Units by mouth every Saturday.        Allergies:  Allergies  Allergen Reactions   Iodinated Contrast Media Rash    Patient  states he was instructed not to take IV contrast.  In 2008 he had an unknown reaction, and was told not to take it again.  He was also told not to take it due to his kidneys.   Iodine Anxiety, Rash and Other (See Comments)    Didn't feel right "instructed not to take per MD--something with his port"     Past Medical History, Surgical history, Social history, and Family History were reviewed and updated.  Review of Systems: Review of Systems  Constitutional:  Positive for malaise/fatigue.  HENT: Negative.    Eyes: Negative.   Respiratory: Negative.    Cardiovascular: Negative.   Gastrointestinal:  Positive for nausea.  Genitourinary: Negative.   Musculoskeletal:  Positive for joint pain and myalgias.  Skin: Negative.   Neurological: Negative.   Endo/Heme/Allergies: Negative.   Psychiatric/Behavioral: Negative.       Physical Exam:  height is 5\' 11"  (1.803 m). His oral temperature is 99 F (37.2 C). His blood pressure is 156/68 (abnormal) and his pulse is 91. His respiration is 18.   Wt Readings from Last 3 Encounters:  07/10/22 166 lb 1.9 oz (75.4 kg)  06/30/22 171 lb 1.2 oz (77.6 kg)  05/28/22 171 lb (77.6 kg)   Physical Exam Vitals reviewed.  HENT:     Head: Normocephalic and atraumatic.  Eyes:     Pupils: Pupils are equal, round, and reactive to light.  Cardiovascular:     Rate and Rhythm: Normal rate and regular  rhythm.     Heart sounds: Normal heart sounds.  Pulmonary:     Effort: Pulmonary effort is normal.     Breath sounds: Normal breath sounds.  Abdominal:     General: Bowel sounds are normal.     Palpations: Abdomen is soft.  Musculoskeletal:        General: No tenderness or deformity. Normal range of motion.     Cervical back: Normal range of motion.  Lymphadenopathy:     Cervical: No cervical adenopathy.  Skin:    General: Skin is warm and dry.     Findings: No erythema or rash.  Neurological:     Mental Status: He is alert and oriented to person, place, and time.  Psychiatric:        Behavior: Behavior normal.        Thought Content: Thought content normal.        Judgment: Judgment normal.      Lab Results  Component Value Date   WBC 4.6 08/20/2022   HGB 9.8 (L) 08/20/2022   HCT 30.8 (L) 08/20/2022   MCV 81.3 08/20/2022   PLT 210 08/20/2022   Lab Results  Component Value Date   FERRITIN 893 (H) 07/10/2022   IRON 60 07/10/2022   TIBC 255 07/10/2022   UIBC 195 07/10/2022   IRONPCTSAT 24 07/10/2022   Lab Results  Component Value Date   RETICCTPCT 0.9 04/16/2022   RBC 3.79 (L) 08/20/2022   RETICCTABS 27.5 03/21/2014   Lab Results  Component Value Date   KPAFRELGTCHN 35.4 (H) 07/10/2022   LAMBDASER 24.0 07/10/2022   KAPLAMBRATIO 1.48 07/10/2022   Lab Results  Component Value Date   IGGSERUM 781 07/10/2022   IGA 168 07/10/2022   IGMSERUM 26 07/10/2022   Lab Results  Component Value Date   TOTALPROTELP 6.3 07/10/2022   ALBUMINELP 3.6 07/10/2022   A1GS 0.2 07/10/2022   A2GS 0.8 07/10/2022   BETS 0.9 07/10/2022   BETA2SER 0.5 11/07/2014  GAMS 0.7 07/10/2022   MSPIKE Not Observed 07/10/2022   SPEI Comment 01/29/2022     Chemistry      Component Value Date/Time   NA 140 08/06/2022 0759   NA 143 02/22/2017 0744   K 3.6 08/06/2022 0759   K 4.0 02/22/2017 0744   CL 101 08/06/2022 0759   CL 106 02/22/2017 0744   CO2 30 08/06/2022 0759   CO2 27  02/22/2017 0744   BUN 23 08/06/2022 0759   BUN 26 (H) 02/22/2017 0744   CREATININE 2.22 (H) 08/06/2022 0759   CREATININE 2.3 (H) 02/22/2017 0744      Component Value Date/Time   CALCIUM 8.7 (L) 08/06/2022 0759   CALCIUM 9.3 02/22/2017 0744   ALKPHOS 68 08/06/2022 0759   ALKPHOS 80 02/22/2017 0744   AST 25 08/06/2022 0759   ALT 19 08/06/2022 0759   ALT 22 02/22/2017 0744   BILITOT 0.6 08/06/2022 0759       Impression and Plan: Mr. Mena is a very pleasant 72 yo African American gentleman with IgG kappa myeloma.  We have been doing this for many years.  So far, he is done incredibly well.  He is on a very good treatment protocol that he is tolerating well.  We will go ahead with his treatment today.  He will also get his testosterone today.  He will get his Aredia now.  He does need Aranesp.  We will get him back to see [place] in another month.     Josph Macho, MD 6/27/20248:41 AM

## 2022-08-20 NOTE — Patient Instructions (Signed)

## 2022-08-20 NOTE — Patient Instructions (Signed)
[facility]  Discharge Instructions: Thank you for choosing [facility] to provide your oncology and hematology care.   If you have a lab appointment with the Cancer Center, please go directly to the Cancer Center and check in at the registration area.  Wear comfortable clothing and clothing appropriate for easy access to any Portacath or PICC line.   We strive to give you quality time with your provider. You may need to reschedule your appointment if you arrive late (15 or more minutes).  Arriving late affects you and other patients whose appointments are after yours.  Also, if you miss three or more appointments without notifying the office, you may be dismissed from the clinic at the provider's discretion.      For prescription refill requests, have your pharmacy contact our office and allow 72 hours for refills to be completed.    Today you received the following chemotherapy and/or immunotherapy agents Kyprolis, Cytoxan, Retacrit, B12, Dilaudid      To help prevent nausea and vomiting after your treatment, we encourage you to take your nausea medication as directed.  BELOW ARE SYMPTOMS THAT SHOULD BE REPORTED IMMEDIATELY: *FEVER GREATER THAN 100.4 F (38 C) OR HIGHER *CHILLS OR SWEATING *NAUSEA AND VOMITING THAT IS NOT CONTROLLED WITH YOUR NAUSEA MEDICATION *UNUSUAL SHORTNESS OF BREATH *UNUSUAL BRUISING OR BLEEDING *URINARY PROBLEMS (pain or burning when urinating, or frequent urination) *BOWEL PROBLEMS (unusual diarrhea, constipation, pain near the anus) TENDERNESS IN MOUTH AND THROAT WITH OR WITHOUT PRESENCE OF ULCERS (sore throat, sores in mouth, or a toothache) UNUSUAL RASH, SWELLING OR PAIN  UNUSUAL VAGINAL DISCHARGE OR ITCHING   Items with * indicate a potential emergency and should be followed up as soon as possible or go to the Emergency Department if any problems should occur.  Please show the CHEMOTHERAPY ALERT CARD or  IMMUNOTHERAPY ALERT CARD at check-in to the Emergency Department and triage nurse. Should you have questions after your visit or need to cancel or reschedule your appointment, please contact [facility] [place]  [phone] and follow the prompts.  Office hours are 8:00 a.m. to 4:30 p.m. Monday - Friday. Please note that voicemails left after 4:00 p.m. may not be returned until the following business day.  We are closed weekends and major holidays. You have access to a nurse at all times for urgent questions. Please call the main number to the clinic [phone] and follow the prompts.  For any non-urgent questions, you may also contact your provider using MyChart. We now offer e-Visits for anyone 64 and older to request care online for non-urgent symptoms. For details visit [email or web address].   Also download the MyChart app! Go to the app store, search "MyChart", open the app, select , and log in with your MyChart username and password.

## 2022-09-02 ENCOUNTER — Ambulatory Visit: Payer: 59 | Admitting: Dietician

## 2022-09-02 NOTE — Progress Notes (Signed)
Nutrition Assessment Reached out to patient at his home telephone# for remote nutrition consult.  Reason for Assessment: MST screen for weight loss.   ASSESSMENT:  Patient is 72 year old male with IgG kappa myeloma who has been followed by Dr. Myna Hidalgo for years.  He has DM2, CKD, GERD, Chronic constipation and IDA.  He reports he has a 2 meal a day pattern has been using ONS that he gets from the cancer center T[facility].  He doesn't eat much pork, has only has bowel movements about twice a week and last BM was 2 days ago.  His weight loss started when he was put on Trulicity and started increasing his exercise.  His appetite is 40-50% but states portions have remained same.  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 He states usual intake: Bowl of cereal, eggs and grits Fish and salad Most time doesn't eat dinner or third meal Ensure or Boost Glucose Control TID not sure what type Snacks: grapes or a peach most days, banana every other day (doesn't like but eats for K+) Fluids: green tea, water (2 bottles) and Gatorade (uses zero sugar) 4-5 bottle,  He has never liked hot liquids.   Anthropometrics:  Weight fluctuates between 166 and 171# (? related to diuretic use or bowel status)  Height: 71" Weight:  07/10/22  166# BMI: 23.17  NUTRITION DIAGNOSIS: Inadequate PO intake to meet increased nutrient needs, r/t anorexia    INTERVENTION:  Relayed that nutrition services are wrap around service provided at no charge and encouraged continued communication if experiencing continued weight loss or any nutritional impact symptoms (NIS). Educated on importance of adequate calorie and protein energy intake  with nutrient dense foods when possible to maintain weight/strength Encouraged increasing frequency of feeds to at least 3 meals Continue oral nutrition supplement, encouraged use of low carb options Discussed strategies for managing constipation Mailed Nutrition Tip  sheet  for  constipation with contact information provided and coupons for Glucerna products   MONITORING, EVALUATION, GOAL: weight, PO intake, Nutrition Impact Symptoms, labs Goal is weight maintenance  Next Visit: PRN at patient or provider request  Gennaro Africa, RDN, LDN Registered Dietitian, [facility] Part Time Remote (Usual office hours: Tuesday-Thursday) Cell: [phone]

## 2022-09-03 ENCOUNTER — Inpatient Hospital Stay (HOSPITAL_BASED_OUTPATIENT_CLINIC_OR_DEPARTMENT_OTHER): Payer: 59 | Admitting: Hematology & Oncology

## 2022-09-03 ENCOUNTER — Inpatient Hospital Stay: Payer: 59

## 2022-09-03 ENCOUNTER — Inpatient Hospital Stay: Payer: 59 | Attending: Hematology & Oncology

## 2022-09-03 ENCOUNTER — Encounter: Payer: Self-pay | Admitting: Hematology & Oncology

## 2022-09-03 DIAGNOSIS — Z299 Encounter for prophylactic measures, unspecified: Secondary | ICD-10-CM

## 2022-09-03 DIAGNOSIS — Z5112 Encounter for antineoplastic immunotherapy: Secondary | ICD-10-CM | POA: Diagnosis present

## 2022-09-03 DIAGNOSIS — D509 Iron deficiency anemia, unspecified: Secondary | ICD-10-CM | POA: Diagnosis not present

## 2022-09-03 DIAGNOSIS — E291 Testicular hypofunction: Secondary | ICD-10-CM | POA: Insufficient documentation

## 2022-09-03 DIAGNOSIS — C9000 Multiple myeloma not having achieved remission: Secondary | ICD-10-CM | POA: Insufficient documentation

## 2022-09-03 DIAGNOSIS — D631 Anemia in chronic kidney disease: Secondary | ICD-10-CM

## 2022-09-03 DIAGNOSIS — Z5111 Encounter for antineoplastic chemotherapy: Secondary | ICD-10-CM | POA: Insufficient documentation

## 2022-09-03 DIAGNOSIS — E349 Endocrine disorder, unspecified: Secondary | ICD-10-CM

## 2022-09-03 LAB — CMP (CANCER CENTER ONLY)
ALT: 14 U/L (ref 0–44)
AST: 19 U/L (ref 15–41)
Albumin: 3.9 g/dL (ref 3.5–5.0)
Alkaline Phosphatase: 80 U/L (ref 38–126)
Anion gap: 9 (ref 5–15)
BUN: 33 mg/dL — ABNORMAL HIGH (ref 8–23)
CO2: 28 mmol/L (ref 22–32)
Calcium: 8.8 mg/dL — ABNORMAL LOW (ref 8.9–10.3)
Chloride: 102 mmol/L (ref 98–111)
Creatinine: 2.55 mg/dL — ABNORMAL HIGH (ref 0.61–1.24)
GFR, Estimated: 26 mL/min — ABNORMAL LOW (ref >60)
Glucose, Bld: 198 mg/dL — ABNORMAL HIGH (ref 70–99)
Potassium: 3.9 mmol/L (ref 3.5–5.1)
Sodium: 139 mmol/L (ref 135–145)
Total Bilirubin: 0.4 mg/dL (ref 0.3–1.2)
Total Protein: 6.5 g/dL (ref 6.5–8.1)

## 2022-09-03 LAB — CBC WITH DIFFERENTIAL (CANCER CENTER ONLY)
Abs Immature Granulocytes: 0.02 10*3/uL (ref 0.00–0.07)
Basophils Absolute: 0.1 10*3/uL (ref 0.0–0.1)
Basophils Relative: 1 %
Eosinophils Absolute: 0.1 10*3/uL (ref 0.0–0.5)
Eosinophils Relative: 3 %
HCT: 32.6 % — ABNORMAL LOW (ref 39.0–52.0)
Hemoglobin: 10.3 g/dL — ABNORMAL LOW (ref 13.0–17.0)
Immature Granulocytes: 1 %
Lymphocytes Relative: 17 %
Lymphs Abs: 0.7 10*3/uL (ref 0.7–4.0)
MCH: 25.6 pg — ABNORMAL LOW (ref 26.0–34.0)
MCHC: 31.6 g/dL (ref 30.0–36.0)
MCV: 80.9 fL (ref 80.0–100.0)
Monocytes Absolute: 0.5 10*3/uL (ref 0.1–1.0)
Monocytes Relative: 12 %
Neutro Abs: 2.9 10*3/uL (ref 1.7–7.7)
Neutrophils Relative %: 66 %
Platelet Count: 190 10*3/uL (ref 150–400)
RBC: 4.03 MIL/uL — ABNORMAL LOW (ref 4.22–5.81)
RDW: 17.1 % — ABNORMAL HIGH (ref 11.5–15.5)
WBC Count: 4.4 10*3/uL (ref 4.0–10.5)
nRBC: 0 % (ref 0.0–0.2)

## 2022-09-03 LAB — LACTATE DEHYDROGENASE: LDH: 213 U/L — ABNORMAL HIGH (ref 98–192)

## 2022-09-03 MED ORDER — CYANOCOBALAMIN 1000 MCG/ML IJ SOLN
1000.0000 ug | Freq: Once | INTRAMUSCULAR | Status: AC
  Administered 2022-09-03: 1000 ug via INTRAMUSCULAR
  Filled 2022-09-03: qty 1

## 2022-09-03 MED ORDER — HYDROMORPHONE HCL 1 MG/ML IJ SOLN
2.0000 mg | Freq: Once | INTRAMUSCULAR | Status: AC
  Administered 2022-09-03: 2 mg via INTRAVENOUS
  Filled 2022-09-03: qty 2

## 2022-09-03 MED ORDER — TESTOSTERONE CYPIONATE 200 MG/ML IM SOLN
400.0000 mg | Freq: Once | INTRAMUSCULAR | Status: AC
  Administered 2022-09-03: 400 mg via INTRAMUSCULAR
  Filled 2022-09-03: qty 2

## 2022-09-03 MED ORDER — HEPARIN SOD (PORK) LOCK FLUSH 100 UNIT/ML IV SOLN
500.0000 [IU] | Freq: Once | INTRAVENOUS | Status: AC | PRN
  Administered 2022-09-03: 500 [IU]

## 2022-09-03 MED ORDER — SODIUM CHLORIDE 0.9 % IV SOLN: Freq: Once | INTRAVENOUS | Status: AC

## 2022-09-03 MED ORDER — PALONOSETRON HCL INJECTION 0.25 MG/5ML
0.2500 mg | Freq: Once | INTRAVENOUS | Status: AC
  Administered 2022-09-03: 0.25 mg via INTRAVENOUS
  Filled 2022-09-03: qty 5

## 2022-09-03 MED ORDER — SODIUM CHLORIDE 0.9 % IV SOLN
300.0000 mg/m2 | Freq: Once | INTRAVENOUS | Status: AC
  Administered 2022-09-03: 580 mg via INTRAVENOUS
  Filled 2022-09-03: qty 29

## 2022-09-03 MED ORDER — DEXTROSE 5 % IV SOLN
36.0000 mg/m2 | Freq: Once | INTRAVENOUS | Status: AC
  Administered 2022-09-03: 70 mg via INTRAVENOUS
  Filled 2022-09-03: qty 30

## 2022-09-03 MED ORDER — SODIUM CHLORIDE 0.9% FLUSH
10.0000 mL | INTRAVENOUS | Status: DC | PRN
  Administered 2022-09-03: 10 mL

## 2022-09-03 MED ORDER — SODIUM CHLORIDE 0.9 % IV SOLN
10.0000 mg | Freq: Once | INTRAVENOUS | Status: AC
  Administered 2022-09-03: 10 mg via INTRAVENOUS
  Filled 2022-09-03: qty 10

## 2022-09-03 NOTE — Patient Instructions (Signed)

## 2022-09-03 NOTE — Progress Notes (Signed)
Hematology and Oncology Follow Up Visit  Kendell Sagraves [id number] 10/28/1950 72 y.o. 09/03/2022   Principle Diagnosis:  IgG kappa myeloma Anemia secondary to renal insufficiency Intermittent iron - deficiency anemia Hypotestosteronemia   Current Therapy:        Aredia 60 mg IV q  month  -- on hold starting 03/05/2022 -- needs tooth extrated --tooth extracted 03/2022 -started on 07/2022 Retacrit SQ as needed for hemoglobin less than 10 DepoTestosterone 400 mg q 4 weeks IV iron as indicated Kyprolis/Cytoxan - s/p cycle #39   Interim History:  Mr. Copeman is here today for follow-up and treatment.  He actually feels pretty good.  He really has had no specific complaints since we last saw him.  He is doing quite well with his treatment.  When we last saw him, there was no monoclonal spike in his blood.  His IgG level was 781 mg/dL.  The Kappa light chain was 3.5 mg/dL.  He does have his arthritic issues.  He does have pain from his arthritis.  He is on pain medication for this.  Blood sugars have always been on the high side.  This is just how he lives.  His appetite has been quite good.  He has had no change in bowel or bladder habits.  Overall, I would have to say that his performance status is probably ECOG 1.     Medications:  Allergies as of 09/03/2022       Reactions   Iodinated Contrast Media Rash   Patient states he was instructed not to take IV contrast.  In 2008 he had an unknown reaction, and was told not to take it again.  He was also told not to take it due to his kidneys.   Iodine Anxiety, Rash, Other (See Comments)   Didn't feel right "instructed not to take per MD--something with his port"        Medication List        Accurate as of September 03, 2022  8:42 AM. If you have any questions, ask your nurse or doctor.          amLODipine 10 MG tablet Commonly known as: NORVASC Take 10 mg by mouth daily.   ammonium lactate 12 % lotion Commonly known as:  AmLactin Apply 1 application topically as needed for dry skin.   atorvastatin 40 MG tablet Commonly known as: LIPITOR Take 40 mg by mouth daily at 6 PM.   budesonide-formoterol 160-4.5 MCG/ACT inhaler Commonly known as: SYMBICORT SMARTSIG:2 Puff(s) By Mouth Twice Daily   Carafate 1 GM/10ML suspension Generic drug: sucralfate Take 1 g by mouth 4 (four) times daily.   carvedilol 25 MG tablet Commonly known as: COREG Take 25 mg by mouth 2 (two) times daily.   chlorpheniramine-HYDROcodone 10-8 MG/5ML Commonly known as: TUSSIONEX SMARTSIG:5 Milliliter(s) By Mouth Every 12 Hours   [employer] Fiber Gummies 2 g Chew Chew by mouth daily.   diazepam 5 MG tablet Commonly known as: VALIUM Take by mouth at bedtime as needed.   diclofenac Sodium 1 % Gel Commonly known as: VOLTAREN Apply topically.   dicyclomine 10 MG capsule Commonly known as: BENTYL TAKE 1 CAPSULE BY MOUTH 3 TIMES A DAY BEFORE MEALS   dronabinol 5 MG capsule Commonly known as: MARINOL Take 1 capsule (5 mg total) by mouth 2 (two) times daily before a meal.   gabapentin 300 MG capsule Commonly known as: NEURONTIN TAKE 1 CAPSULE BY MOUTH THREE TIMES A DAY What changed:  how much  to take how to take this when to take this additional instructions   glimepiride 2 MG tablet Commonly known as: AMARYL Take 2 mg by mouth daily.   hydrocortisone 2.5 % cream Apply topically 2 (two) times daily.   Klor-Con M20 20 MEQ tablet Generic drug: potassium chloride SA Take 20 mEq by mouth daily.   lactulose 10 GM/15ML solution Commonly known as: CHRONULAC Take 30 mLs (20 g total) by mouth daily.   latanoprost 0.005 % ophthalmic solution Commonly known as: XALATAN Place 1 drop into both eyes at bedtime.   linaclotide 145 MCG Caps capsule Commonly known as: LINZESS Take by mouth daily as needed.   losartan 50 MG tablet Commonly known as: COZAAR Take 50 mg by mouth daily.   Lumigan 0.01 % Soln Generic drug:  bimatoprost Place 1 drop into both eyes 3 (three) times daily.   mesalamine 1.2 g EC tablet Commonly known as: LIALDA Take by mouth daily with breakfast.   methocarbamol 500 MG tablet Commonly known as: ROBAXIN Take 1 tablet (500 mg total) by mouth 2 (two) times daily.   metoprolol succinate 25 MG 24 hr tablet Commonly known as: TOPROL-XL Take 25 mg by mouth daily.   mirabegron ER 25 MG Tb24 tablet Commonly known as: MYRBETRIQ Take 20 mg by mouth daily.   morphine 30 MG 12 hr tablet Commonly known as: MS CONTIN Take 30 mg by mouth 2 (two) times daily.   omeprazole 40 MG capsule Commonly known as: PRILOSEC TAKE 1 CAPSULE BY MOUTH TWICE A DAY   oxyCODONE 5 MG immediate release tablet Commonly known as: Roxicodone 1 tab PO q6 hours prn pain   oxyCODONE-acetaminophen 10-325 MG tablet Commonly known as: PERCOCET Take 1 tablet by mouth every 6 (six) hours as needed for pain.   pioglitazone 30 MG tablet Commonly known as: ACTOS Take 30 mg by mouth daily.   polyethylene glycol powder 17 GM/SCOOP powder Commonly known as: GLYCOLAX/MIRALAX SMARTSIG:1 scoopful By Mouth Twice Daily PRN   prochlorperazine 10 MG tablet Commonly known as: COMPAZINE take 1 tablet by mouth every 6 hours if needed for nausea and vomiting   Restasis 0.05 % ophthalmic emulsion Generic drug: cycloSPORINE Place 1 drop into both eyes daily.   sildenafil 100 MG tablet Commonly known as: VIAGRA SMARTSIG:1 Tablet(s) By Mouth   SUMAtriptan 100 MG tablet Commonly known as: IMITREX Take 100 mg by mouth daily.   topiramate 25 MG capsule Commonly known as: TOPAMAX Take 25 mg by mouth 2 (two) times daily.   topiramate 50 MG tablet Commonly known as: TOPAMAX Take 50 mg by mouth at bedtime.   torsemide 20 MG tablet Commonly known as: DEMADEX Take 20 mg by mouth daily.   Trulicity 0.75 MG/0.5ML Sopn Generic drug: Dulaglutide Inject 0.75 mg into the skin every Saturday.   Vitamin D  (Ergocalciferol) 1.25 MG (50000 UNIT) Caps capsule Commonly known as: DRISDOL Take 50,000 Units by mouth every Saturday.        Allergies:  Allergies  Allergen Reactions   Iodinated Contrast Media Rash    Patient states he was instructed not to take IV contrast.  In 2008 he had an unknown reaction, and was told not to take it again.  He was also told not to take it due to his kidneys.   Iodine Anxiety, Rash and Other (See Comments)    Didn't feel right "instructed not to take per MD--something with his port"     Past Medical History, Surgical history, Social  history, and Family History were reviewed and updated.  Review of Systems: Review of Systems  Constitutional:  Positive for malaise/fatigue.  HENT: Negative.    Eyes: Negative.   Respiratory: Negative.    Cardiovascular: Negative.   Gastrointestinal:  Positive for nausea.  Genitourinary: Negative.   Musculoskeletal:  Positive for joint pain and myalgias.  Skin: Negative.   Neurological: Negative.   Endo/Heme/Allergies: Negative.   Psychiatric/Behavioral: Negative.       Physical Exam: Vital signs are temperature of 98.3.  Pulse 75.  Blood pressure 150/67.  Weight is 166 pounds.  Wt Readings from Last 3 Encounters:  09/03/22 166 lb (75.3 kg)  07/10/22 166 lb 1.9 oz (75.4 kg)  06/30/22 171 lb 1.2 oz (77.6 kg)   Physical Exam Vitals reviewed.  HENT:     Head: Normocephalic and atraumatic.  Eyes:     Pupils: Pupils are equal, round, and reactive to light.  Cardiovascular:     Rate and Rhythm: Normal rate and regular rhythm.     Heart sounds: Normal heart sounds.  Pulmonary:     Effort: Pulmonary effort is normal.     Breath sounds: Normal breath sounds.  Abdominal:     General: Bowel sounds are normal.     Palpations: Abdomen is soft.  Musculoskeletal:        General: No tenderness or deformity. Normal range of motion.     Cervical back: Normal range of motion.  Lymphadenopathy:     Cervical: No  cervical adenopathy.  Skin:    General: Skin is warm and dry.     Findings: No erythema or rash.  Neurological:     Mental Status: He is alert and oriented to person, place, and time.  Psychiatric:        Behavior: Behavior normal.        Thought Content: Thought content normal.        Judgment: Judgment normal.       Lab Results  Component Value Date   WBC 4.4 09/03/2022   HGB 10.3 (L) 09/03/2022   HCT 32.6 (L) 09/03/2022   MCV 80.9 09/03/2022   PLT 190 09/03/2022   Lab Results  Component Value Date   FERRITIN 893 (H) 07/10/2022   IRON 60 07/10/2022   TIBC 255 07/10/2022   UIBC 195 07/10/2022   IRONPCTSAT 24 07/10/2022   Lab Results  Component Value Date   RETICCTPCT 0.9 04/16/2022   RBC 4.03 (L) 09/03/2022   RETICCTABS 27.5 03/21/2014   Lab Results  Component Value Date   KPAFRELGTCHN 35.4 (H) 07/10/2022   LAMBDASER 24.0 07/10/2022   KAPLAMBRATIO 1.48 07/10/2022   Lab Results  Component Value Date   IGGSERUM 781 07/10/2022   IGA 168 07/10/2022   IGMSERUM 26 07/10/2022   Lab Results  Component Value Date   TOTALPROTELP 6.3 07/10/2022   ALBUMINELP 3.6 07/10/2022   A1GS 0.2 07/10/2022   A2GS 0.8 07/10/2022   BETS 0.9 07/10/2022   BETA2SER 0.5 11/07/2014   GAMS 0.7 07/10/2022   MSPIKE Not Observed 07/10/2022   SPEI Comment 01/29/2022     Chemistry      Component Value Date/Time   NA 139 09/03/2022 0813   NA 143 02/22/2017 0744   K 3.9 09/03/2022 0813   K 4.0 02/22/2017 0744   CL 102 09/03/2022 0813   CL 106 02/22/2017 0744   CO2 28 09/03/2022 0813   CO2 27 02/22/2017 0744   BUN 33 (H) 09/03/2022 0813  BUN 26 (H) 02/22/2017 0744   CREATININE 2.55 (H) 09/03/2022 0813   CREATININE 2.3 (H) 02/22/2017 0744      Component Value Date/Time   CALCIUM 8.8 (L) 09/03/2022 0813   CALCIUM 9.3 02/22/2017 0744   ALKPHOS 80 09/03/2022 0813   ALKPHOS 80 02/22/2017 0744   AST 19 09/03/2022 0813   ALT 14 09/03/2022 0813   ALT 22 02/22/2017 0744   BILITOT  0.4 09/03/2022 0813       Impression and Plan: Mr. Delange is a very pleasant 72 yo African American gentleman with IgG kappa myeloma.  We have been doing this for many years.  So far, he is done incredibly well.  He is on a very good treatment protocol that he is tolerating well.  We will go ahead with his treatment today.  He will also get his testosterone today.  He will get his Aredia now.  He does need Aranesp.  We will get him back to see [place] in another month.     Josph Macho, MD 7/11/20248:42 AM

## 2022-09-03 NOTE — Patient Instructions (Signed)
[facility]  Discharge Instructions: Thank you for choosing [facility] to provide your oncology and hematology care.   If you have a lab appointment with the Cancer Center, please go directly to the Cancer Center and check in at the registration area.  Wear comfortable clothing and clothing appropriate for easy access to any Portacath or PICC line.   We strive to give you quality time with your provider. You may need to reschedule your appointment if you arrive late (15 or more minutes).  Arriving late affects you and other patients whose appointments are after yours.  Also, if you miss three or more appointments without notifying the office, you may be dismissed from the clinic at the provider's discretion.      For prescription refill requests, have your pharmacy contact our office and allow 72 hours for refills to be completed.    Today you received the following chemotherapy and/or immunotherapy agents kyprolis,cytoxan,testosterone, B-12     To help prevent nausea and vomiting after your treatment, we encourage you to take your nausea medication as directed.  BELOW ARE SYMPTOMS THAT SHOULD BE REPORTED IMMEDIATELY: *FEVER GREATER THAN 100.4 F (38 C) OR HIGHER *CHILLS OR SWEATING *NAUSEA AND VOMITING THAT IS NOT CONTROLLED WITH YOUR NAUSEA MEDICATION *UNUSUAL SHORTNESS OF BREATH *UNUSUAL BRUISING OR BLEEDING *URINARY PROBLEMS (pain or burning when urinating, or frequent urination) *BOWEL PROBLEMS (unusual diarrhea, constipation, pain near the anus) TENDERNESS IN MOUTH AND THROAT WITH OR WITHOUT PRESENCE OF ULCERS (sore throat, sores in mouth, or a toothache) UNUSUAL RASH, SWELLING OR PAIN  UNUSUAL VAGINAL DISCHARGE OR ITCHING   Items with * indicate a potential emergency and should be followed up as soon as possible or go to the Emergency Department if any problems should occur.  Please show the CHEMOTHERAPY ALERT CARD or  IMMUNOTHERAPY ALERT CARD at check-in to the Emergency Department and triage nurse. Should you have questions after your visit or need to cancel or reschedule your appointment, please contact [facility] [place]  [phone] and follow the prompts.  Office hours are 8:00 a.m. to 4:30 p.m. Monday - Friday. Please note that voicemails left after 4:00 p.m. may not be returned until the following business day.  We are closed weekends and major holidays. You have access to a nurse at all times for urgent questions. Please call the main number to the clinic [phone] and follow the prompts.  For any non-urgent questions, you may also contact your provider using MyChart. We now offer e-Visits for anyone 67 and older to request care online for non-urgent symptoms. For details visit [email or web address].   Also download the MyChart app! Go to the app store, search "MyChart", open the app, select [facility], and log in with your MyChart username and password.

## 2022-09-04 ENCOUNTER — Other Ambulatory Visit: Payer: Self-pay

## 2022-09-04 LAB — KAPPA/LAMBDA LIGHT CHAINS
Kappa free light chain: 42.6 mg/L — ABNORMAL HIGH (ref 3.3–19.4)
Kappa, lambda light chain ratio: 1.65 (ref 0.26–1.65)
Lambda free light chains: 25.8 mg/L (ref 5.7–26.3)

## 2022-09-05 LAB — IGG, IGA, IGM
IgA: 168 mg/dL (ref 61–437)
IgG (Immunoglobin G), Serum: 842 mg/dL (ref 603–1613)
IgM (Immunoglobulin M), Srm: 24 mg/dL (ref 15–143)

## 2022-09-05 LAB — TESTOSTERONE: Testosterone: 363 ng/dL (ref 264–916)

## 2022-09-08 LAB — PROTEIN ELECTROPHORESIS, SERUM, WITH REFLEX
A/G Ratio: 1.3 (ref 0.7–1.7)
Albumin ELP: 3.3 g/dL (ref 2.9–4.4)
Alpha-1-Globulin: 0.2 g/dL (ref 0.0–0.4)
Alpha-2-Globulin: 0.7 g/dL (ref 0.4–1.0)
Beta Globulin: 0.9 g/dL (ref 0.7–1.3)
Gamma Globulin: 0.7 g/dL (ref 0.4–1.8)
Globulin, Total: 2.6 g/dL (ref 2.2–3.9)
Total Protein ELP: 5.9 g/dL — ABNORMAL LOW (ref 6.0–8.5)

## 2022-09-17 ENCOUNTER — Inpatient Hospital Stay: Payer: 59

## 2022-09-17 ENCOUNTER — Inpatient Hospital Stay: Payer: 59 | Admitting: Hematology & Oncology

## 2022-09-17 VITALS — BP 157/73 | HR 99 | Temp 99.4°F | Resp 18

## 2022-09-17 DIAGNOSIS — M4712 Other spondylosis with myelopathy, cervical region: Secondary | ICD-10-CM

## 2022-09-17 DIAGNOSIS — D631 Anemia in chronic kidney disease: Secondary | ICD-10-CM

## 2022-09-17 DIAGNOSIS — Z5112 Encounter for antineoplastic immunotherapy: Secondary | ICD-10-CM | POA: Diagnosis not present

## 2022-09-17 DIAGNOSIS — Z299 Encounter for prophylactic measures, unspecified: Secondary | ICD-10-CM

## 2022-09-17 DIAGNOSIS — C9000 Multiple myeloma not having achieved remission: Secondary | ICD-10-CM

## 2022-09-17 DIAGNOSIS — E349 Endocrine disorder, unspecified: Secondary | ICD-10-CM

## 2022-09-17 LAB — CMP (CANCER CENTER ONLY)
ALT: 21 U/L (ref 0–44)
AST: 20 U/L (ref 15–41)
Albumin: 4 g/dL (ref 3.5–5.0)
Alkaline Phosphatase: 75 U/L (ref 38–126)
Anion gap: 11 (ref 5–15)
BUN: 28 mg/dL — ABNORMAL HIGH (ref 8–23)
CO2: 26 mmol/L (ref 22–32)
Calcium: 8.8 mg/dL — ABNORMAL LOW (ref 8.9–10.3)
Chloride: 105 mmol/L (ref 98–111)
Creatinine: 2.77 mg/dL — ABNORMAL HIGH (ref 0.61–1.24)
GFR, Estimated: 23 mL/min — ABNORMAL LOW (ref >60)
Glucose, Bld: 169 mg/dL — ABNORMAL HIGH (ref 70–99)
Potassium: 3.9 mmol/L (ref 3.5–5.1)
Sodium: 142 mmol/L (ref 135–145)
Total Bilirubin: 0.4 mg/dL (ref 0.3–1.2)
Total Protein: 6.3 g/dL — ABNORMAL LOW (ref 6.5–8.1)

## 2022-09-17 LAB — CBC WITH DIFFERENTIAL (CANCER CENTER ONLY)
Abs Immature Granulocytes: 0.11 10*3/uL — ABNORMAL HIGH (ref 0.00–0.07)
Basophils Absolute: 0.1 10*3/uL (ref 0.0–0.1)
Basophils Relative: 1 %
Eosinophils Absolute: 0.1 10*3/uL (ref 0.0–0.5)
Eosinophils Relative: 1 %
HCT: 32.8 % — ABNORMAL LOW (ref 39.0–52.0)
Hemoglobin: 10.4 g/dL — ABNORMAL LOW (ref 13.0–17.0)
Immature Granulocytes: 2 %
Lymphocytes Relative: 9 %
Lymphs Abs: 0.7 10*3/uL (ref 0.7–4.0)
MCH: 25.4 pg — ABNORMAL LOW (ref 26.0–34.0)
MCHC: 31.7 g/dL (ref 30.0–36.0)
MCV: 80 fL (ref 80.0–100.0)
Monocytes Absolute: 0.8 10*3/uL (ref 0.1–1.0)
Monocytes Relative: 11 %
Neutro Abs: 5.7 10*3/uL (ref 1.7–7.7)
Neutrophils Relative %: 76 %
Platelet Count: 228 10*3/uL (ref 150–400)
RBC: 4.1 MIL/uL — ABNORMAL LOW (ref 4.22–5.81)
RDW: 17.2 % — ABNORMAL HIGH (ref 11.5–15.5)
WBC Count: 7.4 10*3/uL (ref 4.0–10.5)
nRBC: 0 % (ref 0.0–0.2)

## 2022-09-17 MED ORDER — PALONOSETRON HCL INJECTION 0.25 MG/5ML
0.2500 mg | Freq: Once | INTRAVENOUS | Status: AC
  Administered 2022-09-17: 0.25 mg via INTRAVENOUS
  Filled 2022-09-17: qty 5

## 2022-09-17 MED ORDER — SODIUM CHLORIDE 0.9 % IV SOLN: Freq: Once | INTRAVENOUS | Status: AC

## 2022-09-17 MED ORDER — SODIUM CHLORIDE 0.9 % IV SOLN
10.0000 mg | Freq: Once | INTRAVENOUS | Status: AC
  Administered 2022-09-17: 10 mg via INTRAVENOUS
  Filled 2022-09-17: qty 10

## 2022-09-17 MED ORDER — SODIUM CHLORIDE 0.9 % IV SOLN
300.0000 mg/m2 | Freq: Once | INTRAVENOUS | Status: AC
  Administered 2022-09-17: 580 mg via INTRAVENOUS
  Filled 2022-09-17: qty 29

## 2022-09-17 MED ORDER — CYANOCOBALAMIN 1000 MCG/ML IJ SOLN
1000.0000 ug | Freq: Once | INTRAMUSCULAR | Status: AC
  Administered 2022-09-17: 1000 ug via INTRAMUSCULAR
  Filled 2022-09-17: qty 1

## 2022-09-17 MED ORDER — DEXTROSE 5 % IV SOLN
36.0000 mg/m2 | Freq: Once | INTRAVENOUS | Status: AC
  Administered 2022-09-17: 70 mg via INTRAVENOUS
  Filled 2022-09-17: qty 30

## 2022-09-17 MED ORDER — SODIUM CHLORIDE 0.9% FLUSH
10.0000 mL | INTRAVENOUS | Status: DC | PRN
  Administered 2022-09-17: 10 mL

## 2022-09-17 MED ORDER — SODIUM CHLORIDE 0.9 % IV SOLN
60.0000 mg | Freq: Once | INTRAVENOUS | Status: AC
  Administered 2022-09-17: 60 mg via INTRAVENOUS
  Filled 2022-09-17: qty 20

## 2022-09-17 MED ORDER — HEPARIN SOD (PORK) LOCK FLUSH 100 UNIT/ML IV SOLN
500.0000 [IU] | Freq: Once | INTRAVENOUS | Status: AC | PRN
  Administered 2022-09-17: 500 [IU]

## 2022-09-17 MED ORDER — HYDROMORPHONE HCL 1 MG/ML IJ SOLN
2.0000 mg | Freq: Once | INTRAMUSCULAR | Status: AC
  Administered 2022-09-17: 2 mg via INTRAVENOUS
  Filled 2022-09-17: qty 2

## 2022-09-17 MED ORDER — SODIUM CHLORIDE 0.9 % IV SOLN: Freq: Once | INTRAVENOUS | Status: DC

## 2022-09-17 NOTE — Progress Notes (Signed)
Ok to treat with creatinine 2.77 per Dr. Myna Hidalgo  Patient complains of generalized pain rating the pain a 10 on the 0 to 10 pain scale. Order released from supportive careplan for Dilaudid 2 mg IVP.

## 2022-09-17 NOTE — Patient Instructions (Signed)

## 2022-09-17 NOTE — Patient Instructions (Signed)
[facility]  Discharge Instructions: Thank you for choosing [facility] to provide your oncology and hematology care.   If you have a lab appointment with the Cancer Center, please go directly to the Cancer Center and check in at the registration area.  Wear comfortable clothing and clothing appropriate for easy access to any Portacath or PICC line.   We strive to give you quality time with your provider. You may need to reschedule your appointment if you arrive late (15 or more minutes).  Arriving late affects you and other patients whose appointments are after yours.  Also, if you miss three or more appointments without notifying the office, you may be dismissed from the clinic at the provider's discretion.      For prescription refill requests, have your pharmacy contact our office and allow 72 hours for refills to be completed.    Today you received the following chemotherapy and/or immunotherapy agents Cytoxan/Kyprolis      To help prevent nausea and vomiting after your treatment, we encourage you to take your nausea medication as directed.  BELOW ARE SYMPTOMS THAT SHOULD BE REPORTED IMMEDIATELY: *FEVER GREATER THAN 100.4 F (38 C) OR HIGHER *CHILLS OR SWEATING *NAUSEA AND VOMITING THAT IS NOT CONTROLLED WITH YOUR NAUSEA MEDICATION *UNUSUAL SHORTNESS OF BREATH *UNUSUAL BRUISING OR BLEEDING *URINARY PROBLEMS (pain or burning when urinating, or frequent urination) *BOWEL PROBLEMS (unusual diarrhea, constipation, pain near the anus) TENDERNESS IN MOUTH AND THROAT WITH OR WITHOUT PRESENCE OF ULCERS (sore throat, sores in mouth, or a toothache) UNUSUAL RASH, SWELLING OR PAIN  UNUSUAL VAGINAL DISCHARGE OR ITCHING   Items with * indicate a potential emergency and should be followed up as soon as possible or go to the Emergency Department if any problems should occur.  Please show the CHEMOTHERAPY ALERT CARD or IMMUNOTHERAPY ALERT CARD at  check-in to the Emergency Department and triage nurse. Should you have questions after your visit or need to cancel or reschedule your appointment, please contact [facility] [place]  [phone] and follow the prompts.  Office hours are 8:00 a.m. to 4:30 p.m. Monday - Friday. Please note that voicemails left after 4:00 p.m. may not be returned until the following business day.  We are closed weekends and major holidays. You have access to a nurse at all times for urgent questions. Please call the main number to the clinic [phone] and follow the prompts.  For any non-urgent questions, you may also contact your provider using MyChart. We now offer e-Visits for anyone 9 and older to request care online for non-urgent symptoms. For details visit [email or web address].   Also download the MyChart app! Go to the app store, search "MyChart", open the app, select [facility], and log in with your MyChart username and password.

## 2022-09-23 ENCOUNTER — Other Ambulatory Visit: Payer: Self-pay

## 2022-10-01 ENCOUNTER — Inpatient Hospital Stay: Payer: 59 | Attending: Hematology & Oncology

## 2022-10-01 ENCOUNTER — Inpatient Hospital Stay (HOSPITAL_BASED_OUTPATIENT_CLINIC_OR_DEPARTMENT_OTHER): Payer: 59 | Admitting: Hematology & Oncology

## 2022-10-01 ENCOUNTER — Encounter: Payer: Self-pay | Admitting: Hematology & Oncology

## 2022-10-01 ENCOUNTER — Inpatient Hospital Stay: Payer: 59

## 2022-10-01 VITALS — BP 158/81 | HR 75 | Temp 97.7°F | Resp 20 | Ht 71.0 in | Wt 165.0 lb

## 2022-10-01 DIAGNOSIS — Z5111 Encounter for antineoplastic chemotherapy: Secondary | ICD-10-CM | POA: Diagnosis present

## 2022-10-01 DIAGNOSIS — C9000 Multiple myeloma not having achieved remission: Secondary | ICD-10-CM | POA: Diagnosis present

## 2022-10-01 DIAGNOSIS — E1122 Type 2 diabetes mellitus with diabetic chronic kidney disease: Secondary | ICD-10-CM | POA: Insufficient documentation

## 2022-10-01 DIAGNOSIS — D509 Iron deficiency anemia, unspecified: Secondary | ICD-10-CM | POA: Insufficient documentation

## 2022-10-01 DIAGNOSIS — E611 Iron deficiency: Secondary | ICD-10-CM | POA: Insufficient documentation

## 2022-10-01 DIAGNOSIS — D631 Anemia in chronic kidney disease: Secondary | ICD-10-CM | POA: Diagnosis not present

## 2022-10-01 DIAGNOSIS — E291 Testicular hypofunction: Secondary | ICD-10-CM | POA: Insufficient documentation

## 2022-10-01 DIAGNOSIS — Z299 Encounter for prophylactic measures, unspecified: Secondary | ICD-10-CM

## 2022-10-01 DIAGNOSIS — E349 Endocrine disorder, unspecified: Secondary | ICD-10-CM

## 2022-10-01 DIAGNOSIS — Z5112 Encounter for antineoplastic immunotherapy: Secondary | ICD-10-CM | POA: Insufficient documentation

## 2022-10-01 DIAGNOSIS — N184 Chronic kidney disease, stage 4 (severe): Secondary | ICD-10-CM | POA: Diagnosis not present

## 2022-10-01 LAB — IRON AND IRON BINDING CAPACITY (CC-WL,HP ONLY)
Iron: 82 ug/dL (ref 45–182)
Saturation Ratios: 35 % (ref 17.9–39.5)
TIBC: 234 ug/dL — ABNORMAL LOW (ref 250–450)
UIBC: 152 ug/dL (ref 117–376)

## 2022-10-01 LAB — CBC WITH DIFFERENTIAL (CANCER CENTER ONLY)
Abs Immature Granulocytes: 0.03 10*3/uL (ref 0.00–0.07)
Basophils Absolute: 0.1 10*3/uL (ref 0.0–0.1)
Basophils Relative: 1 %
Eosinophils Absolute: 0.1 10*3/uL (ref 0.0–0.5)
Eosinophils Relative: 2 %
HCT: 31.2 % — ABNORMAL LOW (ref 39.0–52.0)
Hemoglobin: 10 g/dL — ABNORMAL LOW (ref 13.0–17.0)
Immature Granulocytes: 1 %
Lymphocytes Relative: 12 %
Lymphs Abs: 0.7 10*3/uL (ref 0.7–4.0)
MCH: 25.4 pg — ABNORMAL LOW (ref 26.0–34.0)
MCHC: 32.1 g/dL (ref 30.0–36.0)
MCV: 79.4 fL — ABNORMAL LOW (ref 80.0–100.0)
Monocytes Absolute: 0.5 10*3/uL (ref 0.1–1.0)
Monocytes Relative: 9 %
Neutro Abs: 4.4 10*3/uL (ref 1.7–7.7)
Neutrophils Relative %: 75 %
Platelet Count: 172 10*3/uL (ref 150–400)
RBC: 3.93 MIL/uL — ABNORMAL LOW (ref 4.22–5.81)
RDW: 17 % — ABNORMAL HIGH (ref 11.5–15.5)
WBC Count: 5.8 10*3/uL (ref 4.0–10.5)
nRBC: 0 % (ref 0.0–0.2)

## 2022-10-01 LAB — CMP (CANCER CENTER ONLY)
ALT: 17 U/L (ref 0–44)
AST: 18 U/L (ref 15–41)
Albumin: 4 g/dL (ref 3.5–5.0)
Alkaline Phosphatase: 72 U/L (ref 38–126)
Anion gap: 10 (ref 5–15)
BUN: 40 mg/dL — ABNORMAL HIGH (ref 8–23)
CO2: 28 mmol/L (ref 22–32)
Calcium: 8.8 mg/dL — ABNORMAL LOW (ref 8.9–10.3)
Chloride: 103 mmol/L (ref 98–111)
Creatinine: 2.87 mg/dL — ABNORMAL HIGH (ref 0.61–1.24)
GFR, Estimated: 22 mL/min — ABNORMAL LOW (ref >60)
Glucose, Bld: 105 mg/dL — ABNORMAL HIGH (ref 70–99)
Potassium: 4 mmol/L (ref 3.5–5.1)
Sodium: 141 mmol/L (ref 135–145)
Total Bilirubin: 0.4 mg/dL (ref 0.3–1.2)
Total Protein: 7.1 g/dL (ref 6.5–8.1)

## 2022-10-01 LAB — LACTATE DEHYDROGENASE: LDH: 203 U/L — ABNORMAL HIGH (ref 98–192)

## 2022-10-01 LAB — FERRITIN: Ferritin: 1231 ng/mL — ABNORMAL HIGH (ref 24–336)

## 2022-10-01 MED ORDER — EPOETIN ALFA-EPBX 40000 UNIT/ML IJ SOLN: 40000.0000 [IU] | Freq: Once | INTRAMUSCULAR | Status: DC

## 2022-10-01 MED ORDER — HYDROMORPHONE HCL 1 MG/ML IJ SOLN
2.0000 mg | Freq: Once | INTRAMUSCULAR | Status: AC
  Administered 2022-10-01: 2 mg via INTRAVENOUS
  Filled 2022-10-01: qty 2

## 2022-10-01 MED ORDER — SODIUM CHLORIDE 0.9 % IV SOLN
300.0000 mg/m2 | Freq: Once | INTRAVENOUS | Status: AC
  Administered 2022-10-01: 580 mg via INTRAVENOUS
  Filled 2022-10-01: qty 22

## 2022-10-01 MED ORDER — DEXTROSE 5 % IV SOLN
36.0000 mg/m2 | Freq: Once | INTRAVENOUS | Status: AC
  Administered 2022-10-01: 70 mg via INTRAVENOUS
  Filled 2022-10-01: qty 30

## 2022-10-01 MED ORDER — CYANOCOBALAMIN 1000 MCG/ML IJ SOLN
1000.0000 ug | Freq: Once | INTRAMUSCULAR | Status: AC
  Administered 2022-10-01: 1000 ug via INTRAMUSCULAR
  Filled 2022-10-01: qty 1

## 2022-10-01 MED ORDER — HEPARIN SOD (PORK) LOCK FLUSH 100 UNIT/ML IV SOLN
500.0000 [IU] | Freq: Once | INTRAVENOUS | Status: AC | PRN
  Administered 2022-10-01: 500 [IU]

## 2022-10-01 MED ORDER — SODIUM CHLORIDE 0.9 % IV SOLN
10.0000 mg | Freq: Once | INTRAVENOUS | Status: AC
  Administered 2022-10-01: 10 mg via INTRAVENOUS
  Filled 2022-10-01: qty 10

## 2022-10-01 MED ORDER — SODIUM CHLORIDE 0.9 % IV SOLN: Freq: Once | INTRAVENOUS | Status: AC

## 2022-10-01 MED ORDER — PALONOSETRON HCL INJECTION 0.25 MG/5ML
0.2500 mg | Freq: Once | INTRAVENOUS | Status: AC
  Administered 2022-10-01: 0.25 mg via INTRAVENOUS
  Filled 2022-10-01: qty 5

## 2022-10-01 MED ORDER — TESTOSTERONE CYPIONATE 200 MG/ML IM SOLN
400.0000 mg | Freq: Once | INTRAMUSCULAR | Status: AC
  Administered 2022-10-01: 400 mg via INTRAMUSCULAR
  Filled 2022-10-01: qty 2

## 2022-10-01 MED ORDER — SODIUM CHLORIDE 0.9% FLUSH
10.0000 mL | INTRAVENOUS | Status: DC | PRN
  Administered 2022-10-01: 10 mL

## 2022-10-01 MED ORDER — SODIUM CHLORIDE 0.9 % IV SOLN: Freq: Once | INTRAVENOUS | Status: DC
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
## 2022-10-01 NOTE — Patient Instructions (Signed)

## 2022-10-01 NOTE — Patient Instructions (Signed)
[facility]  Discharge Instructions: Thank you for choosing [facility] to provide your oncology and hematology care.   If you have a lab appointment with the Cancer Center, please go directly to the Cancer Center and check in at the registration area.  Wear comfortable clothing and clothing appropriate for easy access to any Portacath or PICC line.   We strive to give you quality time with your provider. You may need to reschedule your appointment if you arrive late (15 or more minutes).  Arriving late affects you and other patients whose appointments are after yours.  Also, if you miss three or more appointments without notifying the office, you may be dismissed from the clinic at the provider's discretion.      For prescription refill requests, have your pharmacy contact our office and allow 72 hours for refills to be completed.    Today you received the following chemotherapy and/or immunotherapy agents Kyprolis, Cytoxan, testosterone, B12      To help prevent nausea and vomiting after your treatment, we encourage you to take your nausea medication as directed.  BELOW ARE SYMPTOMS THAT SHOULD BE REPORTED IMMEDIATELY: *FEVER GREATER THAN 100.4 F (38 C) OR HIGHER *CHILLS OR SWEATING *NAUSEA AND VOMITING THAT IS NOT CONTROLLED WITH YOUR NAUSEA MEDICATION *UNUSUAL SHORTNESS OF BREATH *UNUSUAL BRUISING OR BLEEDING *URINARY PROBLEMS (pain or burning when urinating, or frequent urination) *BOWEL PROBLEMS (unusual diarrhea, constipation, pain near the anus) TENDERNESS IN MOUTH AND THROAT WITH OR WITHOUT PRESENCE OF ULCERS (sore throat, sores in mouth, or a toothache) UNUSUAL RASH, SWELLING OR PAIN  UNUSUAL VAGINAL DISCHARGE OR ITCHING   Items with * indicate a potential emergency and should be followed up as soon as possible or go to the Emergency Department if any problems should occur.  Please show the CHEMOTHERAPY ALERT CARD or  IMMUNOTHERAPY ALERT CARD at check-in to the Emergency Department and triage nurse. Should you have questions after your visit or need to cancel or reschedule your appointment, please contact [facility]  [phone] and follow the prompts.  Office hours are 8:00 a.m. to 4:30 p.m. Monday - Friday. Please note that voicemails left after 4:00 p.m. may not be returned until the following business day.  We are closed weekends and major holidays. You have access to a nurse at all times for urgent questions. Please call the main number to the clinic [phone] and follow the prompts.  For any non-urgent questions, you may also contact your provider using MyChart. We now offer e-Visits for anyone 51 and older to request care online for non-urgent symptoms. For details visit [email or web address].   Also download the MyChart app! Go to the app store, search "MyChart", open the app, select , and log in with your MyChart username and password.

## 2022-10-01 NOTE — Progress Notes (Signed)
Ok to treat with creatinine of 2.87 per Dr [name]. dph

## 2022-10-15 ENCOUNTER — Inpatient Hospital Stay: Payer: 59

## 2022-10-15 ENCOUNTER — Inpatient Hospital Stay (HOSPITAL_BASED_OUTPATIENT_CLINIC_OR_DEPARTMENT_OTHER): Payer: 59 | Admitting: Hematology & Oncology

## 2022-10-15 ENCOUNTER — Encounter: Payer: Self-pay | Admitting: Hematology & Oncology

## 2022-10-15 VITALS — BP 140/73

## 2022-10-15 VITALS — HR 87 | Temp 98.1°F | Resp 20 | Ht 71.0 in | Wt 166.0 lb

## 2022-10-15 DIAGNOSIS — Z299 Encounter for prophylactic measures, unspecified: Secondary | ICD-10-CM

## 2022-10-15 DIAGNOSIS — C9001 Multiple myeloma in remission: Secondary | ICD-10-CM | POA: Diagnosis not present

## 2022-10-15 DIAGNOSIS — C9000 Multiple myeloma not having achieved remission: Secondary | ICD-10-CM

## 2022-10-15 DIAGNOSIS — Z5112 Encounter for antineoplastic immunotherapy: Secondary | ICD-10-CM | POA: Diagnosis not present

## 2022-10-15 DIAGNOSIS — D631 Anemia in chronic kidney disease: Secondary | ICD-10-CM

## 2022-10-15 DIAGNOSIS — E349 Endocrine disorder, unspecified: Secondary | ICD-10-CM

## 2022-10-15 LAB — IRON AND IRON BINDING CAPACITY (CC-WL,HP ONLY)
Iron: 64 ug/dL (ref 45–182)
Saturation Ratios: 27 % (ref 17.9–39.5)
TIBC: 239 ug/dL — ABNORMAL LOW (ref 250–450)
UIBC: 175 ug/dL (ref 117–376)

## 2022-10-15 LAB — CMP (CANCER CENTER ONLY)
ALT: 11 U/L (ref 0–44)
AST: 16 U/L (ref 15–41)
Albumin: 3.9 g/dL (ref 3.5–5.0)
Alkaline Phosphatase: 79 U/L (ref 38–126)
Anion gap: 8 (ref 5–15)
BUN: 25 mg/dL — ABNORMAL HIGH (ref 8–23)
CO2: 29 mmol/L (ref 22–32)
Calcium: 8.8 mg/dL — ABNORMAL LOW (ref 8.9–10.3)
Chloride: 101 mmol/L (ref 98–111)
Creatinine: 2.46 mg/dL — ABNORMAL HIGH (ref 0.61–1.24)
GFR, Estimated: 27 mL/min — ABNORMAL LOW (ref >60)
Glucose, Bld: 222 mg/dL — ABNORMAL HIGH (ref 70–99)
Potassium: 3.8 mmol/L (ref 3.5–5.1)
Sodium: 138 mmol/L (ref 135–145)
Total Bilirubin: 0.4 mg/dL (ref 0.3–1.2)
Total Protein: 6.6 g/dL (ref 6.5–8.1)

## 2022-10-15 LAB — CBC WITH DIFFERENTIAL (CANCER CENTER ONLY)
Abs Immature Granulocytes: 0.06 10*3/uL (ref 0.00–0.07)
Basophils Absolute: 0.1 10*3/uL (ref 0.0–0.1)
Basophils Relative: 1 %
Eosinophils Absolute: 0.1 10*3/uL (ref 0.0–0.5)
Eosinophils Relative: 1 %
HCT: 31.3 % — ABNORMAL LOW (ref 39.0–52.0)
Hemoglobin: 9.9 g/dL — ABNORMAL LOW (ref 13.0–17.0)
Immature Granulocytes: 1 %
Lymphocytes Relative: 15 %
Lymphs Abs: 0.8 10*3/uL (ref 0.7–4.0)
MCH: 25.3 pg — ABNORMAL LOW (ref 26.0–34.0)
MCHC: 31.6 g/dL (ref 30.0–36.0)
MCV: 80.1 fL (ref 80.0–100.0)
Monocytes Absolute: 0.6 10*3/uL (ref 0.1–1.0)
Monocytes Relative: 11 %
Neutro Abs: 4 10*3/uL (ref 1.7–7.7)
Neutrophils Relative %: 71 %
Platelet Count: 208 10*3/uL (ref 150–400)
RBC: 3.91 MIL/uL — ABNORMAL LOW (ref 4.22–5.81)
RDW: 18.1 % — ABNORMAL HIGH (ref 11.5–15.5)
WBC Count: 5.6 10*3/uL (ref 4.0–10.5)
nRBC: 0 % (ref 0.0–0.2)

## 2022-10-15 LAB — FERRITIN: Ferritin: 1191 ng/mL — ABNORMAL HIGH (ref 24–336)

## 2022-10-15 MED ORDER — SODIUM CHLORIDE 0.9 % IV SOLN
10.0000 mg | Freq: Once | INTRAVENOUS | Status: AC
  Administered 2022-10-15: 10 mg via INTRAVENOUS
  Filled 2022-10-15: qty 10

## 2022-10-15 MED ORDER — CYANOCOBALAMIN 1000 MCG/ML IJ SOLN
1000.0000 ug | Freq: Once | INTRAMUSCULAR | Status: AC
  Administered 2022-10-15: 1000 ug via INTRAMUSCULAR
  Filled 2022-10-15: qty 1

## 2022-10-15 MED ORDER — TESTOSTERONE CYPIONATE 200 MG/ML IM SOLN: 400.0000 mg | Freq: Once | INTRAMUSCULAR | Status: DC

## 2022-10-15 MED ORDER — DEXTROSE 5 % IV SOLN
36.0000 mg/m2 | Freq: Once | INTRAVENOUS | Status: AC
  Administered 2022-10-15: 70 mg via INTRAVENOUS
  Filled 2022-10-15: qty 30

## 2022-10-15 MED ORDER — SODIUM CHLORIDE 0.9 % IV SOLN
300.0000 mg/m2 | Freq: Once | INTRAVENOUS | Status: AC
  Administered 2022-10-15: 580 mg via INTRAVENOUS
  Filled 2022-10-15: qty 10

## 2022-10-15 MED ORDER — EPOETIN ALFA-EPBX 40000 UNIT/ML IJ SOLN
40000.0000 [IU] | Freq: Once | INTRAMUSCULAR | Status: AC
  Administered 2022-10-15: 40000 [IU] via SUBCUTANEOUS
  Filled 2022-10-15: qty 1

## 2022-10-15 MED ORDER — SODIUM CHLORIDE 0.9% FLUSH
10.0000 mL | INTRAVENOUS | Status: DC | PRN
  Administered 2022-10-15: 10 mL

## 2022-10-15 MED ORDER — HYDROMORPHONE HCL 1 MG/ML IJ SOLN
2.0000 mg | Freq: Once | INTRAMUSCULAR | Status: AC
  Administered 2022-10-15: 2 mg via INTRAVENOUS
  Filled 2022-10-15: qty 2

## 2022-10-15 MED ORDER — SODIUM CHLORIDE 0.9 % IV SOLN: Freq: Once | INTRAVENOUS | Status: DC

## 2022-10-15 MED ORDER — PALONOSETRON HCL INJECTION 0.25 MG/5ML
0.2500 mg | Freq: Once | INTRAVENOUS | Status: AC
  Administered 2022-10-15: 0.25 mg via INTRAVENOUS
  Filled 2022-10-15: qty 5

## 2022-10-15 MED ORDER — SODIUM CHLORIDE 0.9 % IV SOLN: Freq: Once | INTRAVENOUS | Status: AC

## 2022-10-15 MED ORDER — HEPARIN SOD (PORK) LOCK FLUSH 100 UNIT/ML IV SOLN
500.0000 [IU] | Freq: Once | INTRAVENOUS | Status: AC | PRN
  Administered 2022-10-15: 500 [IU]

## 2022-10-15 NOTE — Progress Notes (Signed)
Hematology and Oncology Follow Up Visit  [name] [id number] May 01, 1950 72 y.o. 10/15/2022   Principle Diagnosis:  IgG kappa myeloma Anemia secondary to renal insufficiency Intermittent iron - deficiency anemia Hypotestosteronemia   Current Therapy:        Aredia 60 mg IV q  month  -- on hold starting 03/05/2022 -- needs tooth extrated --tooth extracted 03/2022 -started on 07/2022 Retacrit SQ as needed for hemoglobin less than 10 DepoTestosterone 400 mg q 4 weeks IV iron as indicated Kyprolis/Cytoxan - s/p cycle #40   Interim History:  [name] is here today for follow-up and treatment.  He is doing okay.  The cold weather outside this morning is made his arthritis little bit more challenging.  He is tolerating his treatments well.  So far, there is been no evidence that his myeloma has progressed.  There is no monoclonal spike in his blood.  His last IgG level was 988 mg/dL.  The Kappa light chain was 4.9 mg/dL.  He is watching his diabetes.  He is watching what he tries to eat.  He has had no fever.  He has had no cough.  He has chronic back issues.  He apparently had a MRI done of the lumbar spine at a local spine center.  Unfortunately, we do not have these results available.  He has had no change in bowel or bladder habits.  There is been no leg swelling.  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    Overall, I would say that his performance status is probably ECOG 1-2.    Medications:  Allergies as of 10/15/2022       Reactions   Iodinated Contrast Media Rash   Patient states he was instructed not to take IV contrast.  In 2008 he had an unknown reaction, and was told not to take it again.  He was also told not to take it due to his kidneys.   Iodine Anxiety, Rash, Other (See Comments)   Didn't feel right "instructed not to take per MD--something with his port"        Medication List        Accurate as of October 15, 2022  8:28 AM. If you  have any questions, ask your nurse or doctor.          STOP taking these medications    amoxicillin-clavulanate 875-125 MG tablet Commonly known as: AUGMENTIN Stopped by: Josph Macho       TAKE these medications    amLODipine 10 MG tablet Commonly known as: NORVASC Take 10 mg by mouth daily.   ammonium lactate 12 % lotion Commonly known as: AmLactin Apply 1 application topically as needed for dry skin.   atorvastatin 40 MG tablet Commonly known as: LIPITOR Take 40 mg by mouth daily at 6 PM.   budesonide-formoterol 160-4.5 MCG/ACT inhaler Commonly known as: SYMBICORT SMARTSIG:2 Puff(s) By Mouth Twice Daily   Carafate 1 GM/10ML suspension Generic drug: sucralfate Take 1 g by mouth 4 (four) times daily.   carvedilol 25 MG tablet Commonly known as: COREG Take 25 mg by mouth 2 (two) times daily.   chlorpheniramine-HYDROcodone 10-8 MG/5ML Commonly known as: TUSSIONEX SMARTSIG:5 Milliliter(s) By Mouth Every 12 Hours   [employer] Fiber Gummies 2 g Chew Chew by mouth daily.   diazepam 5 MG tablet Commonly known as: VALIUM Take by mouth at bedtime as needed.   diclofenac Sodium 1 % Gel Commonly known as: VOLTAREN Apply topically.  dicyclomine 10 MG capsule Commonly known as: BENTYL TAKE 1 CAPSULE BY MOUTH 3 TIMES A DAY BEFORE MEALS   dronabinol 5 MG capsule Commonly known as: MARINOL Take 1 capsule (5 mg total) by mouth 2 (two) times daily before a meal.   gabapentin 300 MG capsule Commonly known as: NEURONTIN TAKE 1 CAPSULE BY MOUTH THREE TIMES A DAY What changed:  how much to take how to take this when to take this additional instructions   glimepiride 2 MG tablet Commonly known as: AMARYL Take 2 mg by mouth daily.   hydrocortisone 2.5 % cream Apply topically 2 (two) times daily.   Januvia 100 MG tablet Generic drug: sitaGLIPtin Take 100 mg by mouth daily.   Klor-Con M20 20 MEQ tablet Generic drug: potassium chloride SA Take 20 mEq by mouth  daily.   lactulose 10 GM/15ML solution Commonly known as: CHRONULAC Take 30 mLs (20 g total) by mouth daily.   latanoprost 0.005 % ophthalmic solution Commonly known as: XALATAN Place 1 drop into both eyes at bedtime.   linaclotide 145 MCG Caps capsule Commonly known as: LINZESS Take by mouth daily as needed.   losartan 50 MG tablet Commonly known as: COZAAR Take 50 mg by mouth daily.   Lumigan 0.01 % Soln Generic drug: bimatoprost Place 1 drop into both eyes 3 (three) times daily.   mesalamine 1.2 g EC tablet Commonly known as: LIALDA Take by mouth daily with breakfast.   methocarbamol 500 MG tablet Commonly known as: ROBAXIN Take 1 tablet (500 mg total) by mouth 2 (two) times daily.   metoprolol succinate 25 MG 24 hr tablet Commonly known as: TOPROL-XL Take 25 mg by mouth daily.   mirabegron ER 25 MG Tb24 tablet Commonly known as: MYRBETRIQ Take 20 mg by mouth daily.   morphine 30 MG 12 hr tablet Commonly known as: MS CONTIN Take 30 mg by mouth 2 (two) times daily.   omeprazole 40 MG capsule Commonly known as: PRILOSEC TAKE 1 CAPSULE BY MOUTH TWICE A DAY   oxyCODONE 5 MG immediate release tablet Commonly known as: Roxicodone 1 tab PO q6 hours prn pain   oxyCODONE-acetaminophen 10-325 MG tablet Commonly known as: PERCOCET Take 1 tablet by mouth every 6 (six) hours as needed for pain.   pioglitazone 30 MG tablet Commonly known as: ACTOS Take 30 mg by mouth daily.   polyethylene glycol powder 17 GM/SCOOP powder Commonly known as: GLYCOLAX/MIRALAX SMARTSIG:1 scoopful By Mouth Twice Daily PRN   prochlorperazine 10 MG tablet Commonly known as: COMPAZINE take 1 tablet by mouth every 6 hours if needed for nausea and vomiting   Restasis 0.05 % ophthalmic emulsion Generic drug: cycloSPORINE Place 1 drop into both eyes daily.   sildenafil 100 MG tablet Commonly known as: VIAGRA SMARTSIG:1 Tablet(s) By Mouth   SUMAtriptan 100 MG tablet Commonly known  as: IMITREX Take 100 mg by mouth daily.   topiramate 25 MG capsule Commonly known as: TOPAMAX Take 25 mg by mouth 2 (two) times daily.   topiramate 50 MG tablet Commonly known as: TOPAMAX Take 50 mg by mouth at bedtime.   torsemide 20 MG tablet Commonly known as: DEMADEX Take 20 mg by mouth daily.   Trulicity 0.75 MG/0.5ML Sopn Generic drug: Dulaglutide Inject 0.75 mg into the skin every Saturday.   Vitamin D (Ergocalciferol) 1.25 MG (50000 UNIT) Caps capsule Commonly known as: DRISDOL Take 50,000 Units by mouth every Saturday.        Allergies:  Allergies  Allergen  Reactions   Iodinated Contrast Media Rash    Patient states he was instructed not to take IV contrast.  In 2008 he had an unknown reaction, and was told not to take it again.  He was also told not to take it due to his kidneys.   Iodine Anxiety, Rash and Other (See Comments)    Didn't feel right "instructed not to take per MD--something with his port"     Past Medical History, Surgical history, Social history, and Family History were reviewed and updated.  Review of Systems: Review of Systems  Constitutional:  Positive for malaise/fatigue.  HENT: Negative.    Eyes: Negative.   Respiratory: Negative.    Cardiovascular: Negative.   Gastrointestinal:  Positive for nausea.  Genitourinary: Negative.   Musculoskeletal:  Positive for joint pain and myalgias.  Skin: Negative.   Neurological: Negative.   Endo/Heme/Allergies: Negative.   Psychiatric/Behavioral: Negative.       Physical Exam: Vital signs are temperature of 98.1.  Pulse 87.  Blood pressure 159/78.  Weight is 166 pounds.      Wt Readings from Last 3 Encounters:  10/15/22 166 lb (75.3 kg)  10/01/22 165 lb (74.8 kg)  09/03/22 166 lb (75.3 kg)   Physical Exam Vitals reviewed.  HENT:     Head: Normocephalic and atraumatic.  Eyes:     Pupils: Pupils are equal, round, and reactive to light.  Cardiovascular:     Rate and Rhythm: Normal  rate and regular rhythm.     Heart sounds: Normal heart sounds.  Pulmonary:     Effort: Pulmonary effort is normal.     Breath sounds: Normal breath sounds.  Abdominal:     General: Bowel sounds are normal.     Palpations: Abdomen is soft.  Musculoskeletal:        General: No tenderness or deformity. Normal range of motion.     Cervical back: Normal range of motion.  Lymphadenopathy:     Cervical: No cervical adenopathy.  Skin:    General: Skin is warm and dry.     Findings: No erythema or rash.  Neurological:     Mental Status: He is alert and oriented to person, place, and time.  Psychiatric:        Behavior: Behavior normal.        Thought Content: Thought content normal.        Judgment: Judgment normal.       Lab Results  Component Value Date   WBC 5.6 10/15/2022   HGB 9.9 (L) 10/15/2022   HCT 31.3 (L) 10/15/2022   MCV 80.1 10/15/2022   PLT 208 10/15/2022   Lab Results  Component Value Date   FERRITIN 1,231 (H) 10/01/2022   IRON 82 10/01/2022   TIBC 234 (L) 10/01/2022   UIBC 152 10/01/2022   IRONPCTSAT 35 10/01/2022   Lab Results  Component Value Date   RETICCTPCT 0.9 04/16/2022   RBC 3.91 (L) 10/15/2022   RETICCTABS 27.5 03/21/2014   Lab Results  Component Value Date   KPAFRELGTCHN 49.0 (H) 10/01/2022   LAMBDASER 31.7 (H) 10/01/2022   KAPLAMBRATIO 1.55 10/01/2022   Lab Results  Component Value Date   IGGSERUM 988 10/01/2022   IGA 178 10/01/2022   IGMSERUM 24 10/01/2022   Lab Results  Component Value Date   TOTALPROTELP 5.9 (L) 09/03/2022   ALBUMINELP 3.3 09/03/2022   A1GS 0.2 09/03/2022   A2GS 0.7 09/03/2022   BETS 0.9 09/03/2022   BETA2SER 0.5 11/07/2014  GAMS 0.7 09/03/2022   MSPIKE Not Observed 09/03/2022   SPEI Comment 01/29/2022     Chemistry      Component Value Date/Time   NA 141 10/01/2022 0800   NA 143 02/22/2017 0744   K 4.0 10/01/2022 0800   K 4.0 02/22/2017 0744   CL 103 10/01/2022 0800   CL 106 02/22/2017 0744   CO2  28 10/01/2022 0800   CO2 27 02/22/2017 0744   BUN 40 (H) 10/01/2022 0800   BUN 26 (H) 02/22/2017 0744   CREATININE 2.87 (H) 10/01/2022 0800   CREATININE 2.3 (H) 02/22/2017 0744      Component Value Date/Time   CALCIUM 8.8 (L) 10/01/2022 0800   CALCIUM 9.3 02/22/2017 0744   ALKPHOS 72 10/01/2022 0800   ALKPHOS 80 02/22/2017 0744   AST 18 10/01/2022 0800   ALT 17 10/01/2022 0800   ALT 22 02/22/2017 0744   BILITOT 0.4 10/01/2022 0800       Impression and Plan: Mr. Solla is a very pleasant 72 yo African American gentleman with IgG kappa myeloma.  We have been doing this for many years.  So far, he is done incredibly well.  He is on a very good treatment protocol that he is tolerating well.  We will go ahead with his treatment today.  He will also get his testosterone today.  He does need Retacrit today.    We will get him back to see [place] in another month.     Josph Macho, MD 8/22/20248:28 AM

## 2022-10-15 NOTE — Patient Instructions (Signed)

## 2022-10-15 NOTE — Patient Instructions (Signed)
[name]  Discharge Instructions: Thank you for choosing [facility] to provide your oncology and hematology care.   If you have a lab appointment with the Cancer Center, please go directly to the Cancer Center and check in at the registration area.  Wear comfortable clothing and clothing appropriate for easy access to any Portacath or PICC line.   We strive to give you quality time with your provider. You may need to reschedule your appointment if you arrive late (15 or more minutes).  Arriving late affects you and other patients whose appointments are after yours.  Also, if you miss three or more appointments without notifying the office, you may be dismissed from the clinic at the provider's discretion.      For prescription refill requests, have your pharmacy contact our office and allow 72 hours for refills to be completed.    Today you received the following chemotherapy and/or immunotherapy agents Kyprolis, Cytoxan.      To help prevent nausea and vomiting after your treatment, we encourage you to take your nausea medication as directed.  BELOW ARE SYMPTOMS THAT SHOULD BE REPORTED IMMEDIATELY: *FEVER GREATER THAN 100.4 F (38 C) OR HIGHER *CHILLS OR SWEATING *NAUSEA AND VOMITING THAT IS NOT CONTROLLED WITH YOUR NAUSEA MEDICATION *UNUSUAL SHORTNESS OF BREATH *UNUSUAL BRUISING OR BLEEDING *URINARY PROBLEMS (pain or burning when urinating, or frequent urination) *BOWEL PROBLEMS (unusual diarrhea, constipation, pain near the anus) TENDERNESS IN MOUTH AND THROAT WITH OR WITHOUT PRESENCE OF ULCERS (sore throat, sores in mouth, or a toothache) UNUSUAL RASH, SWELLING OR PAIN  UNUSUAL VAGINAL DISCHARGE OR ITCHING   Items with * indicate a potential emergency and should be followed up as soon as possible or go to the Emergency Department if any problems should occur.  Please show the CHEMOTHERAPY ALERT CARD or IMMUNOTHERAPY ALERT CARD  at check-in to the Emergency Department and triage nurse. Should you have questions after your visit or need to cancel or reschedule your appointment, please contact [facility]  [phone] and follow the prompts.  Office hours are 8:00 a.m. to 4:30 p.m. Monday - Friday. Please note that voicemails left after 4:00 p.m. may not be returned until the following business day.  We are closed weekends and major holidays. You have access to a nurse at all times for urgent questions. Please call the main number to the clinic [phone] and follow the prompts.  For any non-urgent questions, you may also contact your provider using MyChart. We now offer e-Visits for anyone 18 and older to request care online for non-urgent symptoms. For details visit [email or web address].   Also download the MyChart app! Go to the app store, search "MyChart", open the app, select [facility], and log in with your MyChart username and password.   

## 2022-10-15 NOTE — Progress Notes (Signed)
BP recheck:  140/73.

## 2022-10-15 NOTE — Progress Notes (Signed)
Ok to treat with creatinine of 2.45 per Dr Myna Hidalgo. dph

## 2022-10-16 ENCOUNTER — Other Ambulatory Visit: Payer: Self-pay

## 2022-10-16 LAB — KAPPA/LAMBDA LIGHT CHAINS
Kappa free light chain: 47.1 mg/L — ABNORMAL HIGH (ref 3.3–19.4)
Kappa, lambda light chain ratio: 1.73 — ABNORMAL HIGH (ref 0.26–1.65)
Lambda free light chains: 27.3 mg/L — ABNORMAL HIGH (ref 5.7–26.3)

## 2022-10-16 LAB — IGG, IGA, IGM
IgA: 172 mg/dL (ref 61–437)
IgG (Immunoglobin G), Serum: 885 mg/dL (ref 603–1613)
IgM (Immunoglobulin M), Srm: 27 mg/dL (ref 15–143)

## 2022-10-17 LAB — TESTOSTERONE: Testosterone: 1163 ng/dL — ABNORMAL HIGH (ref 264–916)

## 2022-10-21 LAB — PROTEIN ELECTROPHORESIS, SERUM, WITH REFLEX
A/G Ratio: 1.3 (ref 0.7–1.7)
Albumin ELP: 3.5 g/dL (ref 2.9–4.4)
Alpha-1-Globulin: 0.2 g/dL (ref 0.0–0.4)
Alpha-2-Globulin: 0.8 g/dL (ref 0.4–1.0)
Beta Globulin: 0.8 g/dL (ref 0.7–1.3)
Gamma Globulin: 0.8 g/dL (ref 0.4–1.8)
Globulin, Total: 2.7 g/dL (ref 2.2–3.9)
Total Protein ELP: 6.2 g/dL (ref 6.0–8.5)

## 2022-10-23 ENCOUNTER — Encounter: Payer: Self-pay | Admitting: Podiatry

## 2022-10-23 ENCOUNTER — Ambulatory Visit (INDEPENDENT_AMBULATORY_CARE_PROVIDER_SITE_OTHER): Payer: 59 | Admitting: Podiatry

## 2022-10-23 DIAGNOSIS — E1151 Type 2 diabetes mellitus with diabetic peripheral angiopathy without gangrene: Secondary | ICD-10-CM

## 2022-10-23 DIAGNOSIS — M79674 Pain in right toe(s): Secondary | ICD-10-CM | POA: Diagnosis not present

## 2022-10-23 DIAGNOSIS — M79675 Pain in left toe(s): Secondary | ICD-10-CM

## 2022-10-23 DIAGNOSIS — B351 Tinea unguium: Secondary | ICD-10-CM | POA: Diagnosis not present

## 2022-10-23 DIAGNOSIS — M2010 Hallux valgus (acquired), unspecified foot: Secondary | ICD-10-CM

## 2022-10-23 NOTE — Addendum Note (Signed)
Addended by: Helane Gunther on: 10/23/2022 08:18 AM   Modules accepted: Orders

## 2022-10-23 NOTE — Progress Notes (Signed)
This patient returns to my office for at risk foot care.  This patient requires this care by a professional since this patient will be at risk due to having diabetes type 2.  This patient is unable to cut nails himself since the patient cannot reach his nails.These nails are painful walking and wearing shoes.  Painful callus under outside ball of both feet.This patient presents for at risk foot care today.  General Appearance  Alert, conversant and in no acute stress.  Vascular  Dorsalis pedis and posterior tibial  pulses are weakly  palpable  bilaterally.  Capillary return is within normal limits  bilaterally. Cold feet bilaterally.  Absent digital hair.  Neurologic  Senn-Weinstein monofilament wire test within normal limits/diminished right.  LOPS absent left foot. Muscle power within normal limits bilaterally.  Nails Thick disfigured discolored nails with subungual debris  from hallux to fifth toes bilaterally. No evidence of bacterial infection or drainage bilaterally.  Orthopedic  No limitations of motion  feet .  No crepitus or effusions noted.  No bony pathology or digital deformities noted.  HAV  B/L.  Exostosis right foot.Plantar flexed fifth metatarsall B/l.  Skin  normotropic skin with no porokeratosis noted bilaterally.  No signs of infections or ulcers noted.  Callus sub 5th met  B/l asymptomatic.  No infection.  Onychomycosis  Pain in right toes  Pain in left toes    Consent was obtained for treatment procedures.   Mechanical debridement of nails 1-5  bilaterally performed with a nail nipper.  Filed with dremel without incident. Debride callus sub 5th  B/L with # 15 blade and dremel tool.  Callus done as a courtesy. Patient qualifies for diabetic shoes due to DPN and HV  B/L.   Return office visit    10 weeks                  Told patient to return for periodic foot care and evaluation due to potential at risk complications.   Helane Gunther DPM

## 2022-10-24 ENCOUNTER — Other Ambulatory Visit: Payer: Self-pay

## 2022-10-29 ENCOUNTER — Inpatient Hospital Stay: Payer: 59 | Attending: Hematology & Oncology

## 2022-10-29 ENCOUNTER — Inpatient Hospital Stay: Payer: 59

## 2022-10-29 ENCOUNTER — Encounter: Payer: Self-pay | Admitting: Hematology & Oncology

## 2022-10-29 ENCOUNTER — Inpatient Hospital Stay (HOSPITAL_BASED_OUTPATIENT_CLINIC_OR_DEPARTMENT_OTHER): Payer: 59 | Admitting: Hematology & Oncology

## 2022-10-29 ENCOUNTER — Other Ambulatory Visit: Payer: Self-pay

## 2022-10-29 VITALS — BP 166/70 | HR 82 | Resp 18

## 2022-10-29 VITALS — BP 156/73 | HR 73 | Temp 98.2°F | Resp 18 | Ht 71.0 in | Wt 168.4 lb

## 2022-10-29 DIAGNOSIS — C9000 Multiple myeloma not having achieved remission: Secondary | ICD-10-CM

## 2022-10-29 DIAGNOSIS — D631 Anemia in chronic kidney disease: Secondary | ICD-10-CM | POA: Insufficient documentation

## 2022-10-29 DIAGNOSIS — Z299 Encounter for prophylactic measures, unspecified: Secondary | ICD-10-CM

## 2022-10-29 DIAGNOSIS — E349 Endocrine disorder, unspecified: Secondary | ICD-10-CM

## 2022-10-29 DIAGNOSIS — M4712 Other spondylosis with myelopathy, cervical region: Secondary | ICD-10-CM

## 2022-10-29 DIAGNOSIS — E291 Testicular hypofunction: Secondary | ICD-10-CM | POA: Insufficient documentation

## 2022-10-29 DIAGNOSIS — C9001 Multiple myeloma in remission: Secondary | ICD-10-CM

## 2022-10-29 DIAGNOSIS — N184 Chronic kidney disease, stage 4 (severe): Secondary | ICD-10-CM | POA: Insufficient documentation

## 2022-10-29 DIAGNOSIS — Z5112 Encounter for antineoplastic immunotherapy: Secondary | ICD-10-CM | POA: Insufficient documentation

## 2022-10-29 DIAGNOSIS — N189 Chronic kidney disease, unspecified: Secondary | ICD-10-CM | POA: Insufficient documentation

## 2022-10-29 DIAGNOSIS — Z5111 Encounter for antineoplastic chemotherapy: Secondary | ICD-10-CM | POA: Diagnosis present

## 2022-10-29 LAB — CBC WITH DIFFERENTIAL (CANCER CENTER ONLY)
Abs Immature Granulocytes: 0.04 10*3/uL (ref 0.00–0.07)
Basophils Absolute: 0.1 10*3/uL (ref 0.0–0.1)
Basophils Relative: 1 %
Eosinophils Absolute: 0.1 10*3/uL (ref 0.0–0.5)
Eosinophils Relative: 2 %
HCT: 31.4 % — ABNORMAL LOW (ref 39.0–52.0)
Hemoglobin: 9.9 g/dL — ABNORMAL LOW (ref 13.0–17.0)
Immature Granulocytes: 1 %
Lymphocytes Relative: 12 %
Lymphs Abs: 0.7 10*3/uL (ref 0.7–4.0)
MCH: 25.3 pg — ABNORMAL LOW (ref 26.0–34.0)
MCHC: 31.5 g/dL (ref 30.0–36.0)
MCV: 80.1 fL (ref 80.0–100.0)
Monocytes Absolute: 0.8 10*3/uL (ref 0.1–1.0)
Monocytes Relative: 14 %
Neutro Abs: 4 10*3/uL (ref 1.7–7.7)
Neutrophils Relative %: 70 %
Platelet Count: 180 10*3/uL (ref 150–400)
RBC: 3.92 MIL/uL — ABNORMAL LOW (ref 4.22–5.81)
RDW: 18.7 % — ABNORMAL HIGH (ref 11.5–15.5)
WBC Count: 5.6 10*3/uL (ref 4.0–10.5)
nRBC: 0 % (ref 0.0–0.2)

## 2022-10-29 LAB — CMP (CANCER CENTER ONLY)
ALT: 16 U/L (ref 0–44)
AST: 26 U/L (ref 15–41)
Albumin: 4.1 g/dL (ref 3.5–5.0)
Alkaline Phosphatase: 75 U/L (ref 38–126)
Anion gap: 8 (ref 5–15)
BUN: 39 mg/dL — ABNORMAL HIGH (ref 8–23)
CO2: 29 mmol/L (ref 22–32)
Calcium: 9 mg/dL (ref 8.9–10.3)
Chloride: 103 mmol/L (ref 98–111)
Creatinine: 2.62 mg/dL — ABNORMAL HIGH (ref 0.61–1.24)
GFR, Estimated: 25 mL/min — ABNORMAL LOW (ref >60)
Glucose, Bld: 84 mg/dL (ref 70–99)
Potassium: 4.1 mmol/L (ref 3.5–5.1)
Sodium: 140 mmol/L (ref 135–145)
Total Bilirubin: 0.5 mg/dL (ref 0.3–1.2)
Total Protein: 6.8 g/dL (ref 6.5–8.1)

## 2022-10-29 LAB — IRON AND IRON BINDING CAPACITY (CC-WL,HP ONLY)
Iron: 50 ug/dL (ref 45–182)
Saturation Ratios: 19 % (ref 17.9–39.5)
TIBC: 258 ug/dL (ref 250–450)
UIBC: 208 ug/dL (ref 117–376)

## 2022-10-29 LAB — FERRITIN: Ferritin: 1095 ng/mL — ABNORMAL HIGH (ref 24–336)

## 2022-10-29 LAB — LACTATE DEHYDROGENASE: LDH: 233 U/L — ABNORMAL HIGH (ref 98–192)

## 2022-10-29 MED ORDER — SODIUM CHLORIDE 0.9 % IV SOLN: Freq: Once | INTRAVENOUS | Status: AC

## 2022-10-29 MED ORDER — SODIUM CHLORIDE 0.9 % IV SOLN
60.0000 mg | Freq: Once | INTRAVENOUS | Status: AC
  Administered 2022-10-29: 60 mg via INTRAVENOUS
  Filled 2022-10-29: qty 20

## 2022-10-29 MED ORDER — PALONOSETRON HCL INJECTION 0.25 MG/5ML
0.2500 mg | Freq: Once | INTRAVENOUS | Status: AC
  Administered 2022-10-29: 0.25 mg via INTRAVENOUS
  Filled 2022-10-29: qty 5

## 2022-10-29 MED ORDER — SODIUM CHLORIDE 0.9 % IV SOLN
300.0000 mg/m2 | Freq: Once | INTRAVENOUS | Status: AC
  Administered 2022-10-29: 580 mg via INTRAVENOUS
  Filled 2022-10-29: qty 7

## 2022-10-29 MED ORDER — SODIUM CHLORIDE 0.9 % IV SOLN: 300.0000 mg | Freq: Once | INTRAVENOUS | Status: DC

## 2022-10-29 MED ORDER — EPOETIN ALFA-EPBX 40000 UNIT/ML IJ SOLN
40000.0000 [IU] | Freq: Once | INTRAMUSCULAR | Status: AC
  Administered 2022-10-29: 40000 [IU] via SUBCUTANEOUS
  Filled 2022-10-29: qty 1

## 2022-10-29 MED ORDER — DEXTROSE 5 % IV SOLN
36.0000 mg/m2 | Freq: Once | INTRAVENOUS | Status: AC
  Administered 2022-10-29: 70 mg via INTRAVENOUS
  Filled 2022-10-29: qty 30

## 2022-10-29 MED ORDER — HYDROMORPHONE HCL 1 MG/ML IJ SOLN
2.0000 mg | Freq: Once | INTRAMUSCULAR | Status: AC
  Administered 2022-10-29: 2 mg via INTRAVENOUS
  Filled 2022-10-29: qty 2

## 2022-10-29 MED ORDER — SODIUM CHLORIDE 0.9% FLUSH
10.0000 mL | INTRAVENOUS | Status: DC | PRN
  Administered 2022-10-29: 10 mL

## 2022-10-29 MED ORDER — TESTOSTERONE CYPIONATE 200 MG/ML IM SOLN
400.0000 mg | Freq: Once | INTRAMUSCULAR | Status: AC
  Administered 2022-10-29: 400 mg via INTRAMUSCULAR
  Filled 2022-10-29: qty 2

## 2022-10-29 MED ORDER — SODIUM CHLORIDE 0.9 % IV SOLN
10.0000 mg | Freq: Once | INTRAVENOUS | Status: AC
  Administered 2022-10-29: 10 mg via INTRAVENOUS
  Filled 2022-10-29: qty 10

## 2022-10-29 MED ORDER — CYANOCOBALAMIN 1000 MCG/ML IJ SOLN
1000.0000 ug | Freq: Once | INTRAMUSCULAR | Status: AC
  Administered 2022-10-29: 1000 ug via INTRAMUSCULAR
  Filled 2022-10-29: qty 1

## 2022-10-29 MED ORDER — HEPARIN SOD (PORK) LOCK FLUSH 100 UNIT/ML IV SOLN
500.0000 [IU] | Freq: Once | INTRAVENOUS | Status: AC | PRN
  Administered 2022-10-29: 500 [IU]

## 2022-10-29 NOTE — Patient Instructions (Signed)

## 2022-10-29 NOTE — Patient Instructions (Addendum)
[facility]  Discharge Instructions: Thank you for choosing [facility] to provide your oncology and hematology care.   If you have a lab appointment with the Cancer Center, please go directly to the Cancer Center and check in at the registration area.  Wear comfortable clothing and clothing appropriate for easy access to any Portacath or PICC line.   We strive to give you quality time with your provider. You may need to reschedule your appointment if you arrive late (15 or more minutes).  Arriving late affects you and other patients whose appointments are after yours.  Also, if you miss three or more appointments without notifying the office, you may be dismissed from the clinic at the provider's discretion.      For prescription refill requests, have your pharmacy contact our office and allow 72 hours for refills to be completed.    Today you received the following chemotherapy and/or immunotherapy agents Kyprolis, Cytoxan, Testosterone, Aredia      To help prevent nausea and vomiting after your treatment, we encourage you to take your nausea medication as directed.  BELOW ARE SYMPTOMS THAT SHOULD BE REPORTED IMMEDIATELY: *FEVER GREATER THAN 100.4 F (38 C) OR HIGHER *CHILLS OR SWEATING *NAUSEA AND VOMITING THAT IS NOT CONTROLLED WITH YOUR NAUSEA MEDICATION *UNUSUAL SHORTNESS OF BREATH *UNUSUAL BRUISING OR BLEEDING *URINARY PROBLEMS (pain or burning when urinating, or frequent urination) *BOWEL PROBLEMS (unusual diarrhea, constipation, pain near the anus) TENDERNESS IN MOUTH AND THROAT WITH OR WITHOUT PRESENCE OF ULCERS (sore throat, sores in mouth, or a toothache) UNUSUAL RASH, SWELLING OR PAIN  UNUSUAL VAGINAL DISCHARGE OR ITCHING   Items with * indicate a potential emergency and should be followed up as soon as possible or go to the Emergency Department if any problems should occur.  Please show the CHEMOTHERAPY ALERT CARD or  IMMUNOTHERAPY ALERT CARD at check-in to the Emergency Department and triage nurse. Should you have questions after your visit or need to cancel or reschedule your appointment, please contact [facility] [place]  [phone] and follow the prompts.  Office hours are 8:00 a.m. to 4:30 p.m. Monday - Friday. Please note that voicemails left after 4:00 p.m. may not be returned until the following business day.  We are closed weekends and major holidays. You have access to a nurse at all times for urgent questions. Please call the main number to the clinic [phone] and follow the prompts.  For any non-urgent questions, you may also contact your provider using MyChart. We now offer e-Visits for anyone 2 and older to request care online for non-urgent symptoms. For details visit [email or web address].   Also download the MyChart app! Go to the app store, search "MyChart", open the app, select , and log in with your MyChart username and password.

## 2022-10-29 NOTE — Progress Notes (Signed)
Reviewed pt labs with Dr. Myna Hidalgo and pt ok to treat with creatinine 2.65

## 2022-10-29 NOTE — Progress Notes (Signed)
Hematology and Oncology Follow Up Visit  [name] [id number] 02-25-1950 72 y.o. 10/29/2022   Principle Diagnosis:  IgG kappa myeloma Anemia secondary to renal insufficiency Intermittent iron - deficiency anemia Hypotestosteronemia   Current Therapy:        Aredia 60 mg IV q  month  -- on hold starting 03/05/2022 -- needs tooth extrated --tooth extracted 03/2022 -started on 07/2022 Retacrit SQ as needed for hemoglobin less than 10 DepoTestosterone 400 mg q 4 weeks IV iron as indicated Kyprolis/Cytoxan - s/p cycle #40   Interim History:  [name] is here today for follow-up and treatment.  He is doing okay.  The cold weather outside this morning is made his arthritis little bit more challenging.  He is tolerating his treatments well.  So far, there is been no evidence that his myeloma has progressed.  There is no monoclonal spike in his blood.  His last IgG level was 885 mg/dL.  The Kappa light chain was 4.7 mg/dL.  He is watching his diabetes.  He is watching what he tries to eat.  He has had no fever.  He has had no cough.  He has chronic back issues.  He apparently had a MRI done of the lumbar spine at a local spine center.  Unfortunately, we do not have these results available.  He has had no change in bowel or bladder habits.  There is been no leg swelling.  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    Overall, I would say that his performance status is probably ECOG 1-2.    Medications:  Allergies as of 10/29/2022       Reactions   Iodinated Contrast Media Rash   Patient states he was instructed not to take IV contrast.  In 2008 he had an unknown reaction, and was told not to take it again.  He was also told not to take it due to his kidneys.   Iodine Anxiety, Rash, Other (See Comments)   Didn't feel right "instructed not to take per MD--something with his port"        Medication List        Accurate as of October 29, 2022  8:46 AM. If you  have any questions, ask your nurse or doctor.          STOP taking these medications    Trulicity 0.75 MG/0.5ML Sopn Generic drug: Dulaglutide Stopped by: Josph Macho       TAKE these medications    amLODipine 10 MG tablet Commonly known as: NORVASC Take 10 mg by mouth daily.   ammonium lactate 12 % lotion Commonly known as: AmLactin Apply 1 application topically as needed for dry skin.   atorvastatin 40 MG tablet Commonly known as: LIPITOR Take 40 mg by mouth daily at 6 PM.   budesonide-formoterol 160-4.5 MCG/ACT inhaler Commonly known as: SYMBICORT SMARTSIG:2 Puff(s) By Mouth Twice Daily   Carafate 1 GM/10ML suspension Generic drug: sucralfate Take 1 g by mouth 4 (four) times daily.   carvedilol 25 MG tablet Commonly known as: COREG Take 25 mg by mouth 2 (two) times daily.   chlorpheniramine-HYDROcodone 10-8 MG/5ML Commonly known as: TUSSIONEX SMARTSIG:5 Milliliter(s) By Mouth Every 12 Hours   [employer] Fiber Gummies 2 g Chew Chew by mouth daily.   diazepam 5 MG tablet Commonly known as: VALIUM Take by mouth at bedtime as needed.   diclofenac Sodium 1 % Gel Commonly known as: VOLTAREN Apply topically.   dicyclomine  10 MG capsule Commonly known as: BENTYL TAKE 1 CAPSULE BY MOUTH 3 TIMES A DAY BEFORE MEALS   dronabinol 5 MG capsule Commonly known as: MARINOL Take 1 capsule (5 mg total) by mouth 2 (two) times daily before a meal.   gabapentin 300 MG capsule Commonly known as: NEURONTIN TAKE 1 CAPSULE BY MOUTH THREE TIMES A DAY What changed:  how much to take how to take this when to take this additional instructions   glimepiride 2 MG tablet Commonly known as: AMARYL Take 2 mg by mouth daily.   hydrocortisone 2.5 % cream Apply topically 2 (two) times daily.   Januvia 100 MG tablet Generic drug: sitaGLIPtin Take 100 mg by mouth daily.   Klor-Con M20 20 MEQ tablet Generic drug: potassium chloride SA Take 20 mEq by mouth daily.    lactulose 10 GM/15ML solution Commonly known as: CHRONULAC Take 30 mLs (20 g total) by mouth daily.   latanoprost 0.005 % ophthalmic solution Commonly known as: XALATAN Place 1 drop into both eyes at bedtime.   linaclotide 145 MCG Caps capsule Commonly known as: LINZESS Take by mouth daily as needed.   losartan 50 MG tablet Commonly known as: COZAAR Take 50 mg by mouth daily.   Lumigan 0.01 % Soln Generic drug: bimatoprost Place 1 drop into both eyes 3 (three) times daily.   mesalamine 1.2 g EC tablet Commonly known as: LIALDA Take by mouth daily with breakfast.   methocarbamol 500 MG tablet Commonly known as: ROBAXIN Take 1 tablet (500 mg total) by mouth 2 (two) times daily.   metoprolol succinate 25 MG 24 hr tablet Commonly known as: TOPROL-XL Take 25 mg by mouth daily.   mirabegron ER 25 MG Tb24 tablet Commonly known as: MYRBETRIQ Take 20 mg by mouth daily.   morphine 30 MG 12 hr tablet Commonly known as: MS CONTIN Take 30 mg by mouth 2 (two) times daily.   omeprazole 40 MG capsule Commonly known as: PRILOSEC TAKE 1 CAPSULE BY MOUTH TWICE A DAY   oxyCODONE 5 MG immediate release tablet Commonly known as: Roxicodone 1 tab PO q6 hours prn pain   oxyCODONE-acetaminophen 10-325 MG tablet Commonly known as: PERCOCET Take 1 tablet by mouth every 6 (six) hours as needed for pain.   pioglitazone 30 MG tablet Commonly known as: ACTOS Take 30 mg by mouth daily.   polyethylene glycol powder 17 GM/SCOOP powder Commonly known as: GLYCOLAX/MIRALAX SMARTSIG:1 scoopful By Mouth Twice Daily PRN   prochlorperazine 10 MG tablet Commonly known as: COMPAZINE take 1 tablet by mouth every 6 hours if needed for nausea and vomiting   Restasis 0.05 % ophthalmic emulsion Generic drug: cycloSPORINE Place 1 drop into both eyes daily.   sildenafil 100 MG tablet Commonly known as: VIAGRA SMARTSIG:1 Tablet(s) By Mouth   SUMAtriptan 100 MG tablet Commonly known as:  IMITREX Take 100 mg by mouth daily.   topiramate 25 MG capsule Commonly known as: TOPAMAX Take 25 mg by mouth 2 (two) times daily.   topiramate 50 MG tablet Commonly known as: TOPAMAX Take 50 mg by mouth at bedtime.   torsemide 20 MG tablet Commonly known as: DEMADEX Take 20 mg by mouth daily.   Vitamin D (Ergocalciferol) 1.25 MG (50000 UNIT) Caps capsule Commonly known as: DRISDOL Take 50,000 Units by mouth every Saturday.        Allergies:  Allergies  Allergen Reactions   Iodinated Contrast Media Rash    Patient states he was instructed not to take  IV contrast.  In 2008 he had an unknown reaction, and was told not to take it again.  He was also told not to take it due to his kidneys.   Iodine Anxiety, Rash and Other (See Comments)    Didn't feel right "instructed not to take per MD--something with his port"     Past Medical History, Surgical history, Social history, and Family History were reviewed and updated.  Review of Systems: Review of Systems  Constitutional:  Positive for malaise/fatigue.  HENT: Negative.    Eyes: Negative.   Respiratory: Negative.    Cardiovascular: Negative.   Gastrointestinal:  Positive for nausea.  Genitourinary: Negative.   Musculoskeletal:  Positive for joint pain and myalgias.  Skin: Negative.   Neurological: Negative.   Endo/Heme/Allergies: Negative.   Psychiatric/Behavioral: Negative.       Physical Exam: Vital signs are temperature of 98.1.  Pulse 87.  Blood pressure 159/78.  Weight is 166 pounds.      Wt Readings from Last 3 Encounters:  10/29/22 168 lb 6.4 oz (76.4 kg)  10/15/22 166 lb (75.3 kg)  10/01/22 165 lb (74.8 kg)   Physical Exam Vitals reviewed.  HENT:     Head: Normocephalic and atraumatic.  Eyes:     Pupils: Pupils are equal, round, and reactive to light.  Cardiovascular:     Rate and Rhythm: Normal rate and regular rhythm.     Heart sounds: Normal heart sounds.  Pulmonary:     Effort: Pulmonary  effort is normal.     Breath sounds: Normal breath sounds.  Abdominal:     General: Bowel sounds are normal.     Palpations: Abdomen is soft.  Musculoskeletal:        General: No tenderness or deformity. Normal range of motion.     Cervical back: Normal range of motion.  Lymphadenopathy:     Cervical: No cervical adenopathy.  Skin:    General: Skin is warm and dry.     Findings: No erythema or rash.  Neurological:     Mental Status: He is alert and oriented to person, place, and time.  Psychiatric:        Behavior: Behavior normal.        Thought Content: Thought content normal.        Judgment: Judgment normal.      Lab Results  Component Value Date   WBC 5.6 10/29/2022   HGB 9.9 (L) 10/29/2022   HCT 31.4 (L) 10/29/2022   MCV 80.1 10/29/2022   PLT 180 10/29/2022   Lab Results  Component Value Date   FERRITIN 1,191 (H) 10/15/2022   IRON 64 10/15/2022   TIBC 239 (L) 10/15/2022   UIBC 175 10/15/2022   IRONPCTSAT 27 10/15/2022   Lab Results  Component Value Date   RETICCTPCT 0.9 04/16/2022   RBC 3.92 (L) 10/29/2022   RETICCTABS 27.5 03/21/2014   Lab Results  Component Value Date   KPAFRELGTCHN 47.1 (H) 10/15/2022   LAMBDASER 27.3 (H) 10/15/2022   KAPLAMBRATIO 1.73 (H) 10/15/2022   Lab Results  Component Value Date   IGGSERUM 885 10/15/2022   IGA 172 10/15/2022   IGMSERUM 27 10/15/2022   Lab Results  Component Value Date   TOTALPROTELP 6.2 10/15/2022   ALBUMINELP 3.5 10/15/2022   A1GS 0.2 10/15/2022   A2GS 0.8 10/15/2022   BETS 0.8 10/15/2022   BETA2SER 0.5 11/07/2014   GAMS 0.8 10/15/2022   MSPIKE Not Observed 10/15/2022   SPEI Comment 01/29/2022  Chemistry      Component Value Date/Time   NA 138 10/15/2022 0805   NA 143 02/22/2017 0744   K 3.8 10/15/2022 0805   K 4.0 02/22/2017 0744   CL 101 10/15/2022 0805   CL 106 02/22/2017 0744   CO2 29 10/15/2022 0805   CO2 27 02/22/2017 0744   BUN 25 (H) 10/15/2022 0805   BUN 26 (H) 02/22/2017  0744   CREATININE 2.46 (H) 10/15/2022 0805   CREATININE 2.3 (H) 02/22/2017 0744      Component Value Date/Time   CALCIUM 8.8 (L) 10/15/2022 0805   CALCIUM 9.3 02/22/2017 0744   ALKPHOS 79 10/15/2022 0805   ALKPHOS 80 02/22/2017 0744   AST 16 10/15/2022 0805   ALT 11 10/15/2022 0805   ALT 22 02/22/2017 0744   BILITOT 0.4 10/15/2022 0805       Impression and Plan: Mr. Jim is a very pleasant 72 yo African American gentleman with IgG kappa myeloma.  We have been doing this for many years.  So far, he is done incredibly well.  He is on a very good treatment protocol that he is tolerating well.  We will go ahead with his treatment today.  He will also get his testosterone today.  He does need Retacrit today.  We will have to see what his iron studies look like.  We will get him back to see [place] in another month.     Josph Macho, MD 9/5/20248:46 AM

## 2022-10-30 ENCOUNTER — Telehealth: Payer: Self-pay | Admitting: Hematology & Oncology

## 2022-10-30 LAB — KAPPA/LAMBDA LIGHT CHAINS
Kappa free light chain: 48.4 mg/L — ABNORMAL HIGH (ref 3.3–19.4)
Kappa, lambda light chain ratio: 1.73 — ABNORMAL HIGH (ref 0.26–1.65)
Lambda free light chains: 27.9 mg/L — ABNORMAL HIGH (ref 5.7–26.3)

## 2022-10-30 NOTE — Telephone Encounter (Signed)
Called patient and left voicemail regarding scheduling appt for one dose of IV iron per order of dr. Myna Hidalgo. Requested call back to schedule.

## 2022-10-31 LAB — IGG, IGA, IGM
IgA: 172 mg/dL (ref 61–437)
IgG (Immunoglobin G), Serum: 1003 mg/dL (ref 603–1613)
IgM (Immunoglobulin M), Srm: 23 mg/dL (ref 15–143)

## 2022-10-31 LAB — TESTOSTERONE: Testosterone: 297 ng/dL (ref 264–916)

## 2022-11-02 LAB — PROTEIN ELECTROPHORESIS, SERUM, WITH REFLEX
A/G Ratio: 1.5 (ref 0.7–1.7)
Albumin ELP: 3.8 g/dL (ref 2.9–4.4)
Alpha-1-Globulin: 0.2 g/dL (ref 0.0–0.4)
Alpha-2-Globulin: 0.8 g/dL (ref 0.4–1.0)
Beta Globulin: 0.8 g/dL (ref 0.7–1.3)
Gamma Globulin: 0.8 g/dL (ref 0.4–1.8)
Globulin, Total: 2.6 g/dL (ref 2.2–3.9)
Total Protein ELP: 6.4 g/dL (ref 6.0–8.5)

## 2022-11-11 ENCOUNTER — Ambulatory Visit: Payer: 59

## 2022-11-11 DIAGNOSIS — M2010 Hallux valgus (acquired), unspecified foot: Secondary | ICD-10-CM

## 2022-11-11 DIAGNOSIS — E1151 Type 2 diabetes mellitus with diabetic peripheral angiopathy without gangrene: Secondary | ICD-10-CM

## 2022-11-11 DIAGNOSIS — L84 Corns and callosities: Secondary | ICD-10-CM

## 2022-11-11 NOTE — Progress Notes (Signed)
Patient presents to the office today for diabetic shoe and insole measuring.  Patient was measured with brannock device to determine size and width for 1 pair of extra depth shoes and foam casted for 3 pair of insoles.   Documentation of medical necessity will be sent to patient's treating diabetic doctor to verify and sign.   Patient's diabetic provider: Roxy Cedar   Shoes and insoles will be ordered at that time and patient will be notified for an appointment for fitting when they arrive.  Shoe size (per patient): 10.05 Brannock measurement:  Patient shoe selection- Shoe choice:   [id number] Shoe size ordered: 10.5WD  ABN and financials signed  Addison Bailey Cped, CFo, CFm

## 2022-11-12 ENCOUNTER — Inpatient Hospital Stay: Payer: 59

## 2022-11-12 ENCOUNTER — Encounter: Payer: Self-pay | Admitting: Hematology & Oncology

## 2022-11-12 ENCOUNTER — Inpatient Hospital Stay (HOSPITAL_BASED_OUTPATIENT_CLINIC_OR_DEPARTMENT_OTHER): Payer: 59 | Admitting: Hematology & Oncology

## 2022-11-12 VITALS — BP 151/61 | HR 77 | Resp 17

## 2022-11-12 DIAGNOSIS — C9000 Multiple myeloma not having achieved remission: Secondary | ICD-10-CM

## 2022-11-12 DIAGNOSIS — D631 Anemia in chronic kidney disease: Secondary | ICD-10-CM

## 2022-11-12 DIAGNOSIS — E349 Endocrine disorder, unspecified: Secondary | ICD-10-CM

## 2022-11-12 DIAGNOSIS — Z299 Encounter for prophylactic measures, unspecified: Secondary | ICD-10-CM

## 2022-11-12 DIAGNOSIS — Z5112 Encounter for antineoplastic immunotherapy: Secondary | ICD-10-CM | POA: Diagnosis not present

## 2022-11-12 LAB — RETICULOCYTES
Immature Retic Fract: 19 % — ABNORMAL HIGH (ref 2.3–15.9)
RBC.: 4.03 MIL/uL — ABNORMAL LOW (ref 4.22–5.81)
Retic Count, Absolute: 56.4 10*3/uL (ref 19.0–186.0)
Retic Ct Pct: 1.4 % (ref 0.4–3.1)

## 2022-11-12 LAB — CBC WITH DIFFERENTIAL (CANCER CENTER ONLY)
Abs Immature Granulocytes: 0.11 10*3/uL — ABNORMAL HIGH (ref 0.00–0.07)
Basophils Absolute: 0.1 10*3/uL (ref 0.0–0.1)
Basophils Relative: 1 %
Eosinophils Absolute: 0.1 10*3/uL (ref 0.0–0.5)
Eosinophils Relative: 3 %
HCT: 32.1 % — ABNORMAL LOW (ref 39.0–52.0)
Hemoglobin: 10 g/dL — ABNORMAL LOW (ref 13.0–17.0)
Immature Granulocytes: 2 %
Lymphocytes Relative: 12 %
Lymphs Abs: 0.6 10*3/uL — ABNORMAL LOW (ref 0.7–4.0)
MCH: 24.6 pg — ABNORMAL LOW (ref 26.0–34.0)
MCHC: 31.2 g/dL (ref 30.0–36.0)
MCV: 79.1 fL — ABNORMAL LOW (ref 80.0–100.0)
Monocytes Absolute: 0.8 10*3/uL (ref 0.1–1.0)
Monocytes Relative: 15 %
Neutro Abs: 3.6 10*3/uL (ref 1.7–7.7)
Neutrophils Relative %: 67 %
Platelet Count: 211 10*3/uL (ref 150–400)
RBC: 4.06 MIL/uL — ABNORMAL LOW (ref 4.22–5.81)
RDW: 18.5 % — ABNORMAL HIGH (ref 11.5–15.5)
WBC Count: 5.4 10*3/uL (ref 4.0–10.5)
nRBC: 0 % (ref 0.0–0.2)

## 2022-11-12 LAB — IRON AND IRON BINDING CAPACITY (CC-WL,HP ONLY)
Iron: 32 ug/dL — ABNORMAL LOW (ref 45–182)
Saturation Ratios: 13 % — ABNORMAL LOW (ref 17.9–39.5)
TIBC: 241 ug/dL — ABNORMAL LOW (ref 250–450)
UIBC: 209 ug/dL (ref 117–376)

## 2022-11-12 LAB — CMP (CANCER CENTER ONLY)
ALT: 13 U/L (ref 0–44)
AST: 17 U/L (ref 15–41)
Albumin: 4 g/dL (ref 3.5–5.0)
Alkaline Phosphatase: 81 U/L (ref 38–126)
Anion gap: 7 (ref 5–15)
BUN: 26 mg/dL — ABNORMAL HIGH (ref 8–23)
CO2: 33 mmol/L — ABNORMAL HIGH (ref 22–32)
Calcium: 8.7 mg/dL — ABNORMAL LOW (ref 8.9–10.3)
Chloride: 101 mmol/L (ref 98–111)
Creatinine: 2.61 mg/dL — ABNORMAL HIGH (ref 0.61–1.24)
GFR, Estimated: 25 mL/min — ABNORMAL LOW (ref >60)
Glucose, Bld: 112 mg/dL — ABNORMAL HIGH (ref 70–99)
Potassium: 4 mmol/L (ref 3.5–5.1)
Sodium: 141 mmol/L (ref 135–145)
Total Bilirubin: 0.4 mg/dL (ref 0.3–1.2)
Total Protein: 6.5 g/dL (ref 6.5–8.1)

## 2022-11-12 LAB — FERRITIN: Ferritin: 884 ng/mL — ABNORMAL HIGH (ref 24–336)

## 2022-11-12 LAB — LACTATE DEHYDROGENASE: LDH: 204 U/L — ABNORMAL HIGH (ref 98–192)

## 2022-11-12 MED ORDER — SODIUM CHLORIDE 0.9 % IV SOLN: Freq: Once | INTRAVENOUS | Status: DC

## 2022-11-12 MED ORDER — HYDROMORPHONE HCL 1 MG/ML IJ SOLN
2.0000 mg | Freq: Once | INTRAMUSCULAR | Status: AC
  Administered 2022-11-12: 2 mg via INTRAVENOUS
  Filled 2022-11-12: qty 2

## 2022-11-12 MED ORDER — DEXTROSE 5 % IV SOLN
36.0000 mg/m2 | Freq: Once | INTRAVENOUS | Status: AC
  Administered 2022-11-12: 70 mg via INTRAVENOUS
  Filled 2022-11-12: qty 30

## 2022-11-12 MED ORDER — CYANOCOBALAMIN 1000 MCG/ML IJ SOLN
1000.0000 ug | Freq: Once | INTRAMUSCULAR | Status: AC
  Administered 2022-11-12: 1000 ug via INTRAMUSCULAR
  Filled 2022-11-12: qty 1

## 2022-11-12 MED ORDER — SODIUM CHLORIDE 0.9 % IV SOLN
10.0000 mg | Freq: Once | INTRAVENOUS | Status: AC
  Administered 2022-11-12: 10 mg via INTRAVENOUS
  Filled 2022-11-12: qty 10

## 2022-11-12 MED ORDER — EPOETIN ALFA-EPBX 40000 UNIT/ML IJ SOLN: 40000.0000 [IU] | Freq: Once | INTRAMUSCULAR | Status: DC

## 2022-11-12 MED ORDER — SODIUM CHLORIDE 0.9 % IV SOLN: INTRAVENOUS | Status: DC

## 2022-11-12 MED ORDER — HEPARIN SOD (PORK) LOCK FLUSH 100 UNIT/ML IV SOLN
500.0000 [IU] | Freq: Once | INTRAVENOUS | Status: AC | PRN
  Administered 2022-11-12: 500 [IU]

## 2022-11-12 MED ORDER — SODIUM CHLORIDE 0.9% FLUSH
10.0000 mL | INTRAVENOUS | Status: DC | PRN
  Administered 2022-11-12: 10 mL

## 2022-11-12 MED ORDER — SODIUM CHLORIDE 0.9 % IV SOLN
300.0000 mg/m2 | Freq: Once | INTRAVENOUS | Status: AC
  Administered 2022-11-12: 580 mg via INTRAVENOUS
  Filled 2022-11-12: qty 29

## 2022-11-12 MED ORDER — PALONOSETRON HCL INJECTION 0.25 MG/5ML
0.2500 mg | Freq: Once | INTRAVENOUS | Status: AC
  Administered 2022-11-12: 0.25 mg via INTRAVENOUS
  Filled 2022-11-12: qty 5

## 2022-11-12 NOTE — Progress Notes (Signed)
Hematology and Oncology Follow Up Visit  [name] [id number] [birth year] 72 y.o. 11/12/2022   Principle Diagnosis:  IgG kappa myeloma Anemia secondary to renal insufficiency Intermittent iron - deficiency anemia Hypotestosteronemia   Current Therapy:        Aredia 60 mg IV q  month  -- on hold starting 03/05/2022 -- needs tooth extrated --tooth extracted 03/2022 -started on 07/2022 Retacrit SQ as needed for hemoglobin less than 10 DepoTestosterone 400 mg q 4 weeks IV iron as indicated Kyprolis/Cytoxan - s/p cycle #40   Interim History:  [name] is here today for follow-up and treatment.  He is doing okay.  That wet weather  outside this morning has made his arthritis little bit more challenging.  He is tolerating his treatments well.  So far, there is been no evidence that his myeloma has progressed.  There is no monoclonal spike in his blood.  His last IgG level was 1000 mg/dL.  The Kappa light chain was 4.8 mg/dL.  His last testosterone level was 300.    He is s watching his diabetes.  He is watching what he tries to eat.  He has had no fever.  He has had no cough.  He has had no change in bowel or bladder habits.  There is been no leg swelling.  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    Overall, I would say that his performance status is probably ECOG 1-2.    Medications:  Allergies as of 11/12/2022       Reactions   Iodinated Contrast Media Rash   Patient states he was instructed not to take IV contrast.  In 2008 he had an unknown reaction, and was told not to take it again.  He was also told not to take it due to his kidneys.   Iodine Anxiety, Rash, Other (See Comments)   Didn't feel right "instructed not to take per MD--something with his port"        Medication List        Accurate as of November 12, 2022  8:25 AM. If you have any questions, ask your nurse or doctor.          amLODipine 10 MG tablet Commonly known as:  NORVASC Take 10 mg by mouth daily.   ammonium lactate 12 % lotion Commonly known as: AmLactin Apply 1 application topically as needed for dry skin.   atorvastatin 40 MG tablet Commonly known as: LIPITOR Take 40 mg by mouth daily at 6 PM.   budesonide-formoterol 160-4.5 MCG/ACT inhaler Commonly known as: SYMBICORT SMARTSIG:2 Puff(s) By Mouth Twice Daily   Carafate 1 GM/10ML suspension Generic drug: sucralfate Take 1 g by mouth 4 (four) times daily.   carvedilol 25 MG tablet Commonly known as: COREG Take 25 mg by mouth 2 (two) times daily.   chlorpheniramine-HYDROcodone 10-8 MG/5ML Commonly known as: TUSSIONEX SMARTSIG:5 Milliliter(s) By Mouth Every 12 Hours   [employer] Fiber Gummies 2 g Chew Chew by mouth daily.   diazepam 5 MG tablet Commonly known as: VALIUM Take by mouth at bedtime as needed.   diclofenac Sodium 1 % Gel Commonly known as: VOLTAREN Apply topically.   dicyclomine 10 MG capsule Commonly known as: BENTYL TAKE 1 CAPSULE BY MOUTH 3 TIMES A DAY BEFORE MEALS   dronabinol 5 MG capsule Commonly known as: MARINOL Take 1 capsule (5 mg total) by mouth 2 (two) times daily before a meal.   gabapentin 300 MG capsule Commonly known  as: NEURONTIN TAKE 1 CAPSULE BY MOUTH THREE TIMES A DAY What changed:  how much to take how to take this when to take this additional instructions   glimepiride 2 MG tablet Commonly known as: AMARYL Take 2 mg by mouth daily.   hydrocortisone 2.5 % cream Apply topically 2 (two) times daily.   Januvia 100 MG tablet Generic drug: sitaGLIPtin Take 100 mg by mouth daily.   Klor-Con M20 20 MEQ tablet Generic drug: potassium chloride SA Take 20 mEq by mouth daily.   lactulose 10 GM/15ML solution Commonly known as: CHRONULAC Take 30 mLs (20 g total) by mouth daily.   latanoprost 0.005 % ophthalmic solution Commonly known as: XALATAN Place 1 drop into both eyes at bedtime.   linaclotide 145 MCG Caps capsule Commonly known  as: LINZESS Take by mouth daily as needed.   losartan 50 MG tablet Commonly known as: COZAAR Take 50 mg by mouth daily.   Lumigan 0.01 % Soln Generic drug: bimatoprost Place 1 drop into both eyes 3 (three) times daily.   mesalamine 1.2 g EC tablet Commonly known as: LIALDA Take by mouth daily with breakfast.   methocarbamol 500 MG tablet Commonly known as: ROBAXIN Take 1 tablet (500 mg total) by mouth 2 (two) times daily.   metoprolol succinate 25 MG 24 hr tablet Commonly known as: TOPROL-XL Take 25 mg by mouth daily.   mirabegron ER 25 MG Tb24 tablet Commonly known as: MYRBETRIQ Take 20 mg by mouth daily.   morphine 30 MG 12 hr tablet Commonly known as: MS CONTIN Take 30 mg by mouth 2 (two) times daily.   omeprazole 40 MG capsule Commonly known as: PRILOSEC TAKE 1 CAPSULE BY MOUTH TWICE A DAY   oxyCODONE 5 MG immediate release tablet Commonly known as: Roxicodone 1 tab PO q6 hours prn pain   oxyCODONE-acetaminophen 10-325 MG tablet Commonly known as: PERCOCET Take 1 tablet by mouth every 6 (six) hours as needed for pain.   pioglitazone 30 MG tablet Commonly known as: ACTOS Take 30 mg by mouth daily.   polyethylene glycol powder 17 GM/SCOOP powder Commonly known as: GLYCOLAX/MIRALAX SMARTSIG:1 scoopful By Mouth Twice Daily PRN   prochlorperazine 10 MG tablet Commonly known as: COMPAZINE take 1 tablet by mouth every 6 hours if needed for nausea and vomiting   Restasis 0.05 % ophthalmic emulsion Generic drug: cycloSPORINE Place 1 drop into both eyes daily.   sildenafil 100 MG tablet Commonly known as: VIAGRA SMARTSIG:1 Tablet(s) By Mouth   SUMAtriptan 100 MG tablet Commonly known as: IMITREX Take 100 mg by mouth daily.   topiramate 25 MG capsule Commonly known as: TOPAMAX Take 25 mg by mouth 2 (two) times daily.   topiramate 50 MG tablet Commonly known as: TOPAMAX Take 50 mg by mouth at bedtime.   torsemide 20 MG tablet Commonly known as:  DEMADEX Take 20 mg by mouth daily.   Vitamin D (Ergocalciferol) 1.25 MG (50000 UNIT) Caps capsule Commonly known as: DRISDOL Take 50,000 Units by mouth every Saturday.   Xtampza ER 13.5 MG C12a Generic drug: oxyCODONE ER daily.        Allergies:  Allergies  Allergen Reactions   Iodinated Contrast Media Rash    Patient states he was instructed not to take IV contrast.  In 2008 he had an unknown reaction, and was told not to take it again.  He was also told not to take it due to his kidneys.   Iodine Anxiety, Rash and Other (See  Comments)    Didn't feel right "instructed not to take per MD--something with his port"     Past Medical History, Surgical history, Social history, and Family History were reviewed and updated.  Review of Systems: Review of Systems  Constitutional:  Positive for malaise/fatigue.  HENT: Negative.    Eyes: Negative.   Respiratory: Negative.    Cardiovascular: Negative.   Gastrointestinal:  Positive for nausea.  Genitourinary: Negative.   Musculoskeletal:  Positive for joint pain and myalgias.  Skin: Negative.   Neurological: Negative.   Endo/Heme/Allergies: Negative.   Psychiatric/Behavioral: Negative.       Physical Exam: Vital signs are temperature of 98.9.  Pulse 90.  Blood pressure 160/82.  Weight is 171 pounds.        Wt Readings from Last 3 Encounters:  11/12/22 171 lb (77.6 kg)  10/29/22 168 lb 6.4 oz (76.4 kg)  10/15/22 166 lb (75.3 kg)   Physical Exam Vitals reviewed.  HENT:     Head: Normocephalic and atraumatic.  Eyes:     Pupils: Pupils are equal, round, and reactive to light.  Cardiovascular:     Rate and Rhythm: Normal rate and regular rhythm.     Heart sounds: Normal heart sounds.  Pulmonary:     Effort: Pulmonary effort is normal.     Breath sounds: Normal breath sounds.  Abdominal:     General: Bowel sounds are normal.     Palpations: Abdomen is soft.  Musculoskeletal:        General: No tenderness or  deformity. Normal range of motion.     Cervical back: Normal range of motion.  Lymphadenopathy:     Cervical: No cervical adenopathy.  Skin:    General: Skin is warm and dry.     Findings: No erythema or rash.  Neurological:     Mental Status: He is alert and oriented to person, place, and time.  Psychiatric:        Behavior: Behavior normal.        Thought Content: Thought content normal.        Judgment: Judgment normal.       Lab Results  Component Value Date   WBC 5.4 11/12/2022   HGB 10.0 (L) 11/12/2022   HCT 32.1 (L) 11/12/2022   MCV 79.1 (L) 11/12/2022   PLT 211 11/12/2022   Lab Results  Component Value Date   FERRITIN 1,095 (H) 10/29/2022   IRON 50 10/29/2022   TIBC 258 10/29/2022   UIBC 208 10/29/2022   IRONPCTSAT 19 10/29/2022   Lab Results  Component Value Date   RETICCTPCT 1.4 11/12/2022   RBC 4.06 (L) 11/12/2022   RBC 4.03 (L) 11/12/2022   RETICCTABS 27.5 03/21/2014   Lab Results  Component Value Date   KPAFRELGTCHN 48.4 (H) 10/29/2022   LAMBDASER 27.9 (H) 10/29/2022   KAPLAMBRATIO 1.73 (H) 10/29/2022   Lab Results  Component Value Date   IGGSERUM 1,003 10/29/2022   IGA 172 10/29/2022   IGMSERUM 23 10/29/2022   Lab Results  Component Value Date   TOTALPROTELP 6.4 10/29/2022   ALBUMINELP 3.8 10/29/2022   A1GS 0.2 10/29/2022   A2GS 0.8 10/29/2022   BETS 0.8 10/29/2022   BETA2SER 0.5 11/07/2014   GAMS 0.8 10/29/2022   MSPIKE Not Observed 10/29/2022   SPEI Comment 01/29/2022     Chemistry      Component Value Date/Time   NA 140 10/29/2022 0808   NA 143 02/22/2017 0744   K 4.1 10/29/2022 6295  K 4.0 02/22/2017 0744   CL 103 10/29/2022 0808   CL 106 02/22/2017 0744   CO2 29 10/29/2022 0808   CO2 27 02/22/2017 0744   BUN 39 (H) 10/29/2022 0808   BUN 26 (H) 02/22/2017 0744   CREATININE 2.62 (H) 10/29/2022 0808   CREATININE 2.3 (H) 02/22/2017 0744      Component Value Date/Time   CALCIUM 9.0 10/29/2022 0808   CALCIUM 9.3  02/22/2017 0744   ALKPHOS 75 10/29/2022 0808   ALKPHOS 80 02/22/2017 0744   AST 26 10/29/2022 0808   ALT 16 10/29/2022 0808   ALT 22 02/22/2017 0744   BILITOT 0.5 10/29/2022 0808       Impression and Plan: Mr. Pasini is a very pleasant 72 yo African American gentleman with IgG kappa myeloma.  We have been doing this for many years.  So far, he is done incredibly well.  He is on a very good treatment protocol that he is tolerating well.  We will go ahead with his treatment today.  He will also get his testosterone today.  He does need Retacrit today.  We will have to see what his iron studies look like.  We will get him back to see [place] in another month.     Josph Macho, MD 9/19/20248:25 AM

## 2022-11-12 NOTE — Patient Instructions (Signed)
[facility]  Discharge Instructions: Thank you for choosing [facility] to provide your oncology and hematology care.   If you have a lab appointment with the Cancer Center, please go directly to the Cancer Center and check in at the registration area.  Wear comfortable clothing and clothing appropriate for easy access to any Portacath or PICC line.   We strive to give you quality time with your provider. You may need to reschedule your appointment if you arrive late (15 or more minutes).  Arriving late affects you and other patients whose appointments are after yours.  Also, if you miss three or more appointments without notifying the office, you may be dismissed from the clinic at the provider's discretion.      For prescription refill requests, have your pharmacy contact our office and allow 72 hours for refills to be completed.    Today you received the following chemotherapy and/or immunotherapy agents Kyprolis, Cytoxan,       To help prevent nausea and vomiting after your treatment, we encourage you to take your nausea medication as directed.  BELOW ARE SYMPTOMS THAT SHOULD BE REPORTED IMMEDIATELY: *FEVER GREATER THAN 100.4 F (38 C) OR HIGHER *CHILLS OR SWEATING *NAUSEA AND VOMITING THAT IS NOT CONTROLLED WITH YOUR NAUSEA MEDICATION *UNUSUAL SHORTNESS OF BREATH *UNUSUAL BRUISING OR BLEEDING *URINARY PROBLEMS (pain or burning when urinating, or frequent urination) *BOWEL PROBLEMS (unusual diarrhea, constipation, pain near the anus) TENDERNESS IN MOUTH AND THROAT WITH OR WITHOUT PRESENCE OF ULCERS (sore throat, sores in mouth, or a toothache) UNUSUAL RASH, SWELLING OR PAIN  UNUSUAL VAGINAL DISCHARGE OR ITCHING   Items with * indicate a potential emergency and should be followed up as soon as possible or go to the Emergency Department if any problems should occur.  Please show the CHEMOTHERAPY ALERT CARD or IMMUNOTHERAPY ALERT CARD  at check-in to the Emergency Department and triage nurse. Should you have questions after your visit or need to cancel or reschedule your appointment, please contact [facility] [place]  [phone] and follow the prompts.  Office hours are 8:00 a.m. to 4:30 p.m. Monday - Friday. Please note that voicemails left after 4:00 p.m. may not be returned until the following business day.  We are closed weekends and major holidays. You have access to a nurse at all times for urgent questions. Please call the main number to the clinic [phone] and follow the prompts.  For any non-urgent questions, you may also contact your provider using MyChart. We now offer e-Visits for anyone 30 and older to request care online for non-urgent symptoms. For details visit [email or web address].   Also download the MyChart app! Go to the app store, search "MyChart", open the app, select [facility], and log in with your MyChart username and password.

## 2022-11-12 NOTE — Progress Notes (Signed)
Reviewed pt labs with Dr. Myna Hidalgo and pt ok to treat with creatinine 2.61

## 2022-11-12 NOTE — Patient Instructions (Signed)

## 2022-11-14 LAB — IGG, IGA, IGM
IgA: 146 mg/dL (ref 61–437)
IgG (Immunoglobin G), Serum: 968 mg/dL (ref 603–1613)
IgM (Immunoglobulin M), Srm: 22 mg/dL (ref 15–143)

## 2022-11-14 LAB — TESTOSTERONE: Testosterone: 1290 ng/dL — ABNORMAL HIGH (ref 264–916)

## 2022-11-16 LAB — KAPPA/LAMBDA LIGHT CHAINS
Kappa free light chain: 49.4 mg/L — ABNORMAL HIGH (ref 3.3–19.4)
Kappa, lambda light chain ratio: 1.59 (ref 0.26–1.65)
Lambda free light chains: 31.1 mg/L — ABNORMAL HIGH (ref 5.7–26.3)

## 2022-11-17 LAB — PROTEIN ELECTROPHORESIS, SERUM, WITH REFLEX
A/G Ratio: 1.2 (ref 0.7–1.7)
Albumin ELP: 3.3 g/dL (ref 2.9–4.4)
Alpha-1-Globulin: 0.2 g/dL (ref 0.0–0.4)
Alpha-2-Globulin: 0.8 g/dL (ref 0.4–1.0)
Beta Globulin: 0.8 g/dL (ref 0.7–1.3)
Gamma Globulin: 0.8 g/dL (ref 0.4–1.8)
Globulin, Total: 2.7 g/dL (ref 2.2–3.9)
Total Protein ELP: 6 g/dL (ref 6.0–8.5)

## 2022-11-26 ENCOUNTER — Inpatient Hospital Stay: Payer: 59 | Attending: Hematology & Oncology

## 2022-11-26 ENCOUNTER — Inpatient Hospital Stay: Payer: 59

## 2022-11-26 ENCOUNTER — Telehealth: Payer: Self-pay

## 2022-11-26 ENCOUNTER — Other Ambulatory Visit (HOSPITAL_BASED_OUTPATIENT_CLINIC_OR_DEPARTMENT_OTHER): Payer: Self-pay

## 2022-11-26 ENCOUNTER — Inpatient Hospital Stay: Payer: 59 | Admitting: Hematology & Oncology

## 2022-11-26 ENCOUNTER — Other Ambulatory Visit: Payer: Self-pay

## 2022-11-26 ENCOUNTER — Encounter: Payer: Self-pay | Admitting: Hematology & Oncology

## 2022-11-26 ENCOUNTER — Other Ambulatory Visit: Payer: Self-pay | Admitting: Oncology

## 2022-11-26 VITALS — BP 129/71 | HR 66 | Temp 98.3°F | Resp 18

## 2022-11-26 DIAGNOSIS — E291 Testicular hypofunction: Secondary | ICD-10-CM | POA: Insufficient documentation

## 2022-11-26 DIAGNOSIS — C9000 Multiple myeloma not having achieved remission: Secondary | ICD-10-CM | POA: Diagnosis not present

## 2022-11-26 DIAGNOSIS — E119 Type 2 diabetes mellitus without complications: Secondary | ICD-10-CM | POA: Diagnosis not present

## 2022-11-26 DIAGNOSIS — Z79890 Hormone replacement therapy (postmenopausal): Secondary | ICD-10-CM | POA: Diagnosis not present

## 2022-11-26 DIAGNOSIS — D631 Anemia in chronic kidney disease: Secondary | ICD-10-CM

## 2022-11-26 DIAGNOSIS — E349 Endocrine disorder, unspecified: Secondary | ICD-10-CM

## 2022-11-26 DIAGNOSIS — Z5112 Encounter for antineoplastic immunotherapy: Secondary | ICD-10-CM | POA: Diagnosis present

## 2022-11-26 DIAGNOSIS — M4712 Other spondylosis with myelopathy, cervical region: Secondary | ICD-10-CM

## 2022-11-26 DIAGNOSIS — N184 Chronic kidney disease, stage 4 (severe): Secondary | ICD-10-CM | POA: Insufficient documentation

## 2022-11-26 DIAGNOSIS — Z5111 Encounter for antineoplastic chemotherapy: Secondary | ICD-10-CM | POA: Insufficient documentation

## 2022-11-26 DIAGNOSIS — Z299 Encounter for prophylactic measures, unspecified: Secondary | ICD-10-CM

## 2022-11-26 DIAGNOSIS — N189 Chronic kidney disease, unspecified: Secondary | ICD-10-CM | POA: Diagnosis not present

## 2022-11-26 DIAGNOSIS — R634 Abnormal weight loss: Secondary | ICD-10-CM

## 2022-11-26 DIAGNOSIS — R630 Anorexia: Secondary | ICD-10-CM

## 2022-11-26 LAB — CBC WITH DIFFERENTIAL (CANCER CENTER ONLY)
Abs Immature Granulocytes: 0.05 10*3/uL (ref 0.00–0.07)
Basophils Absolute: 0.1 10*3/uL (ref 0.0–0.1)
Basophils Relative: 1 %
Eosinophils Absolute: 0.2 10*3/uL (ref 0.0–0.5)
Eosinophils Relative: 4 %
HCT: 28.3 % — ABNORMAL LOW (ref 39.0–52.0)
Hemoglobin: 9.1 g/dL — ABNORMAL LOW (ref 13.0–17.0)
Immature Granulocytes: 1 %
Lymphocytes Relative: 11 %
Lymphs Abs: 0.7 10*3/uL (ref 0.7–4.0)
MCH: 24.9 pg — ABNORMAL LOW (ref 26.0–34.0)
MCHC: 32.2 g/dL (ref 30.0–36.0)
MCV: 77.5 fL — ABNORMAL LOW (ref 80.0–100.0)
Monocytes Absolute: 0.8 10*3/uL (ref 0.1–1.0)
Monocytes Relative: 11 %
Neutro Abs: 5 10*3/uL (ref 1.7–7.7)
Neutrophils Relative %: 72 %
Platelet Count: 179 10*3/uL (ref 150–400)
RBC: 3.65 MIL/uL — ABNORMAL LOW (ref 4.22–5.81)
RDW: 17.9 % — ABNORMAL HIGH (ref 11.5–15.5)
WBC Count: 6.9 10*3/uL (ref 4.0–10.5)
nRBC: 0 % (ref 0.0–0.2)

## 2022-11-26 LAB — CMP (CANCER CENTER ONLY)
ALT: 16 U/L (ref 0–44)
AST: 21 U/L (ref 15–41)
Albumin: 3.7 g/dL (ref 3.5–5.0)
Alkaline Phosphatase: 80 U/L (ref 38–126)
Anion gap: 9 (ref 5–15)
BUN: 38 mg/dL — ABNORMAL HIGH (ref 8–23)
CO2: 28 mmol/L (ref 22–32)
Calcium: 8.5 mg/dL — ABNORMAL LOW (ref 8.9–10.3)
Chloride: 104 mmol/L (ref 98–111)
Creatinine: 3.06 mg/dL — ABNORMAL HIGH (ref 0.61–1.24)
GFR, Estimated: 21 mL/min — ABNORMAL LOW (ref >60)
Glucose, Bld: 112 mg/dL — ABNORMAL HIGH (ref 70–99)
Potassium: 3.9 mmol/L (ref 3.5–5.1)
Sodium: 141 mmol/L (ref 135–145)
Total Bilirubin: 0.4 mg/dL (ref 0.3–1.2)
Total Protein: 6.5 g/dL (ref 6.5–8.1)

## 2022-11-26 LAB — LACTATE DEHYDROGENASE: LDH: 212 U/L — ABNORMAL HIGH (ref 98–192)

## 2022-11-26 LAB — IRON AND IRON BINDING CAPACITY (CC-WL,HP ONLY)
Iron: 72 ug/dL (ref 45–182)
Saturation Ratios: 32 % (ref 17.9–39.5)
TIBC: 228 ug/dL — ABNORMAL LOW (ref 250–450)
UIBC: 156 ug/dL (ref 117–376)

## 2022-11-26 LAB — FERRITIN: Ferritin: 1196 ng/mL — ABNORMAL HIGH (ref 24–336)

## 2022-11-26 MED ORDER — CYANOCOBALAMIN 1000 MCG/ML IJ SOLN
1000.0000 ug | Freq: Once | INTRAMUSCULAR | Status: AC
  Administered 2022-11-26: 1000 ug via INTRAMUSCULAR
  Filled 2022-11-26: qty 1

## 2022-11-26 MED ORDER — TESTOSTERONE CYPIONATE 200 MG/ML IM SOLN
400.0000 mg | Freq: Once | INTRAMUSCULAR | Status: AC
  Administered 2022-11-26: 400 mg via INTRAMUSCULAR
  Filled 2022-11-26: qty 2

## 2022-11-26 MED ORDER — SODIUM CHLORIDE 0.9 % IV SOLN: 60.0000 mg | Freq: Once | INTRAVENOUS | Status: DC

## 2022-11-26 MED ORDER — INFLUENZA VAC A&B SURF ANT ADJ 0.5 ML IM SUSY
0.5000 mL | PREFILLED_SYRINGE | Freq: Once | INTRAMUSCULAR | 0 refills | Status: AC
  Filled 2022-11-26: qty 0.5, 1d supply, fill #0

## 2022-11-26 MED ORDER — SODIUM CHLORIDE 0.9 % IV SOLN: INTRAVENOUS | Status: DC

## 2022-11-26 MED ORDER — SODIUM CHLORIDE 0.9 % IV SOLN
10.0000 mg | Freq: Once | INTRAVENOUS | Status: AC
  Administered 2022-11-26: 10 mg via INTRAVENOUS
  Filled 2022-11-26: qty 10

## 2022-11-26 MED ORDER — HEPARIN SOD (PORK) LOCK FLUSH 100 UNIT/ML IV SOLN
500.0000 [IU] | Freq: Once | INTRAVENOUS | Status: AC | PRN
  Administered 2022-11-26: 500 [IU]

## 2022-11-26 MED ORDER — SODIUM CHLORIDE 0.9 % IV SOLN: Freq: Once | INTRAVENOUS | Status: DC

## 2022-11-26 MED ORDER — DEXTROSE 5 % IV SOLN
36.0000 mg/m2 | Freq: Once | INTRAVENOUS | Status: AC
  Administered 2022-11-26: 70 mg via INTRAVENOUS
  Filled 2022-11-26: qty 30

## 2022-11-26 MED ORDER — HYDROMORPHONE HCL 1 MG/ML IJ SOLN
2.0000 mg | Freq: Once | INTRAMUSCULAR | Status: AC
  Administered 2022-11-26: 2 mg via INTRAVENOUS
  Filled 2022-11-26: qty 2

## 2022-11-26 MED ORDER — DRONABINOL 5 MG PO CAPS: 5.0000 mg | ORAL_CAPSULE | Freq: Two times a day (BID) | ORAL | 1 refills | Status: DC

## 2022-11-26 MED ORDER — EPOETIN ALFA-EPBX 40000 UNIT/ML IJ SOLN: 40000.0000 [IU] | Freq: Once | INTRAMUSCULAR | Status: DC

## 2022-11-26 MED ORDER — SODIUM CHLORIDE 0.9 % IV SOLN
300.0000 mg | Freq: Once | INTRAVENOUS | Status: AC
  Administered 2022-11-26: 300 mg via INTRAVENOUS
  Filled 2022-11-26: qty 300

## 2022-11-26 MED ORDER — COVID-19 MRNA VAC-TRIS(PFIZER) 30 MCG/0.3ML IM SUSY
0.3000 mL | PREFILLED_SYRINGE | Freq: Once | INTRAMUSCULAR | 0 refills | Status: AC
  Filled 2022-11-26: qty 0.3, 1d supply, fill #0

## 2022-11-26 MED ORDER — PALONOSETRON HCL INJECTION 0.25 MG/5ML
0.2500 mg | Freq: Once | INTRAVENOUS | Status: AC
  Administered 2022-11-26: 0.25 mg via INTRAVENOUS
  Filled 2022-11-26: qty 5

## 2022-11-26 MED ORDER — SODIUM CHLORIDE 0.9% FLUSH
10.0000 mL | INTRAVENOUS | Status: DC | PRN
  Administered 2022-11-26: 10 mL

## 2022-11-26 MED ORDER — SODIUM CHLORIDE 0.9 % IV SOLN
300.0000 mg/m2 | Freq: Once | INTRAVENOUS | Status: AC
  Administered 2022-11-26: 580 mg via INTRAVENOUS
  Filled 2022-11-26: qty 8

## 2022-11-26 NOTE — Patient Instructions (Addendum)
[facility]  Discharge Instructions: Thank you for choosing [facility] to provide your oncology and hematology care.   If you have a lab appointment with the Cancer Center, please go directly to the Cancer Center and check in at the registration area.  Wear comfortable clothing and clothing appropriate for easy access to any Portacath or PICC line.   We strive to give you quality time with your provider. You may need to reschedule your appointment if you arrive late (15 or more minutes).  Arriving late affects you and other patients whose appointments are after yours.  Also, if you miss three or more appointments without notifying the office, you may be dismissed from the clinic at the provider's discretion.      For prescription refill requests, have your pharmacy contact our office and allow 72 hours for refills to be completed.    Today you received the following chemotherapy and/or immunotherapy agents Aredia, Kyprolis, Cytoxan.      To help prevent nausea and vomiting after your treatment, we encourage you to take your nausea medication as directed.  BELOW ARE SYMPTOMS THAT SHOULD BE REPORTED IMMEDIATELY: *FEVER GREATER THAN 100.4 F (38 C) OR HIGHER *CHILLS OR SWEATING *NAUSEA AND VOMITING THAT IS NOT CONTROLLED WITH YOUR NAUSEA MEDICATION *UNUSUAL SHORTNESS OF BREATH *UNUSUAL BRUISING OR BLEEDING *URINARY PROBLEMS (pain or burning when urinating, or frequent urination) *BOWEL PROBLEMS (unusual diarrhea, constipation, pain near the anus) TENDERNESS IN MOUTH AND THROAT WITH OR WITHOUT PRESENCE OF ULCERS (sore throat, sores in mouth, or a toothache) UNUSUAL RASH, SWELLING OR PAIN  UNUSUAL VAGINAL DISCHARGE OR ITCHING   Items with * indicate a potential emergency and should be followed up as soon as possible or go to the Emergency Department if any problems should occur.  Please show the CHEMOTHERAPY ALERT CARD or IMMUNOTHERAPY  ALERT CARD at check-in to the Emergency Department and triage nurse. Should you have questions after your visit or need to cancel or reschedule your appointment, please contact [facility]  [phone] and follow the prompts.  Office hours are 8:00 a.m. to 4:30 p.m. Monday - Friday. Please note that voicemails left after 4:00 p.m. may not be returned until the following business day.  We are closed weekends and major holidays. You have access to a nurse at all times for urgent questions. Please call the main number to the clinic [phone] and follow the prompts.  For any non-urgent questions, you may also contact your provider using MyChart. We now offer e-Visits for anyone 67 and older to request care online for non-urgent symptoms. For details visit [email or web address].   Also download the MyChart app! Go to the app store, search "MyChart", open the app, select , and log in with your MyChart username and password.   Vitamin B12 Deficiency Vitamin B12 deficiency means that your body does not have enough vitamin B12. The body needs this important vitamin: To make red blood cells. To make genes (DNA). To help the nerves work. If you do not have enough vitamin B12 in your body, you can have health problems, such as not having enough red blood cells in the blood (anemia). What are the causes? Not eating enough foods that contain vitamin B12. Not being able to take in (absorb) vitamin B12 from the food that you eat. Certain diseases. A condition in which the body does not make enough of a certain protein. This results  in your body not taking in enough vitamin B12. Having a surgery in which part of the stomach or small intestine is taken out. Taking medicines that make it hard for the body to take in vitamin B12. These include: Heartburn medicines. Some medicines that are used to treat diabetes. What increases the risk? Being an older  adult. Eating a vegetarian or vegan diet that does not include any foods that come from animals. Not eating enough foods that contain vitamin B12 while you are pregnant. Taking certain medicines. Having alcoholism. What are the signs or symptoms? In some cases, there are no symptoms. If the condition leads to too few blood cells or nerve damage, symptoms can occur, such as: Feeling weak or tired. Not being hungry. Losing feeling (numbness) or tingling in your hands and feet. Redness and burning of the tongue. Feeling sad (depressed). Confusion or memory problems. Trouble walking. If anemia is very bad, symptoms can include: Being short of breath. Being dizzy. Having a very fast heartbeat. How is this treated? Changing the way you eat and drink, such as: Eating more foods that contain vitamin B12. Drinking little or no alcohol. Getting vitamin B12 shots. Taking vitamin B12 supplements by mouth (orally). Your doctor will tell you the dose that is best for you. Follow these instructions at home: Eating and drinking  Eat foods that come from animals and have a lot of vitamin B12 in them. These include: Meats and poultry. This includes beef, pork, chicken, [place], and organ meats, such as liver. Seafood, such as clams, rainbow trout, salmon, tuna, and haddock. Eggs. Dairy foods such as milk, yogurt, and cheese. Eat breakfast cereals that have vitamin B12 added to them (are fortified). Check the label. The items listed above may not be a complete list of foods and beverages you can eat and drink. Contact a dietitian for more information. Alcohol use Do not drink alcohol if: Your doctor tells you not to drink. You are pregnant, may be pregnant, or are planning to become pregnant. If you drink alcohol: Limit how much you have to: 0-1 drink a day for women. 0-2 drinks a day for men. Know how much alcohol is in your drink. In the [place] one drink equals one 12 oz bottle of beer (355  mL), one 5 oz glass of wine (148 mL), or one 1 oz glass of hard liquor (44 mL). General instructions Get any vitamin B12 shots if told by your doctor. Take supplements only as told by your doctor. Follow the directions. Keep all follow-up visits. Contact a doctor if: Your symptoms come back. Your symptoms get worse or do not get better with treatment. Get help right away if: You have trouble breathing. You have a very fast heartbeat. You have chest pain. You get dizzy. You faint. These symptoms may be an emergency. Get help right away. Call 911. Do not wait to see if the symptoms will go away. Do not drive yourself to the hospital. Summary Vitamin B12 deficiency means that your body is not getting enough of the vitamin. In some cases, there are no symptoms of this condition. Treatment may include making a change in the way you eat and drink, getting shots, or taking supplements. Eat foods that have vitamin B12 in them. This information is not intended to replace advice given to you by your health care provider. Make sure you discuss any questions you have with your health care provider. Document Revised: 10/04/2020 Document Reviewed: 10/04/2020 Elsevier Patient Education  2024 Elsevier Inc.   Iron Sucrose Injection What is this medication? IRON SUCROSE (EYE ern SOO krose) treats low levels of iron (iron deficiency anemia) in people with kidney disease. Iron is a mineral that plays an important role in making red blood cells, which carry oxygen from your lungs to the rest of your body. This medicine may be used for other purposes; ask your health care provider or pharmacist if you have questions. COMMON BRAND NAME(S): Venofer What should I tell my care team before I take this medication? They need to know if you have any of these conditions: Anemia not caused by low iron levels Heart disease High levels of iron in the blood Kidney disease Liver disease An unusual or allergic  reaction to iron, other medications, foods, dyes, or preservatives Pregnant or trying to get pregnant Breastfeeding How should I use this medication? This medication is for infusion into a vein. It is given in a hospital or clinic setting. Talk to your care team about the use of this medication in children. While this medication may be prescribed for children as young as 2 years for selected conditions, precautions do apply. Overdosage: If you think you have taken too much of this medicine contact a poison control center or emergency room at once. NOTE: This medicine is only for you. Do not share this medicine with others. What if I miss a dose? Keep appointments for follow-up doses. It is important not to miss your dose. Call your care team if you are unable to keep an appointment. What may interact with this medication? Do not take this medication with any of the following: Deferoxamine Dimercaprol Other iron products This medication may also interact with the following: Chloramphenicol Deferasirox This list may not describe all possible interactions. Give your health care provider a list of all the medicines, herbs, non-prescription drugs, or dietary supplements you use. Also tell them if you smoke, drink alcohol, or use illegal drugs. Some items may interact with your medicine. What should I watch for while using this medication? Visit your care team regularly. Tell your care team if your symptoms do not start to get better or if they get worse. You may need blood work done while you are taking this medication. You may need to follow a special diet. Talk to your care team. Foods that contain iron include: whole grains/cereals, dried fruits, beans, or peas, leafy green vegetables, and organ meats (liver, kidney). What side effects may I notice from receiving this medication? Side effects that you should report to your care team as soon as possible: Allergic reactions--skin rash, itching,  hives, swelling of the face, lips, tongue, or throat Low blood pressure--dizziness, feeling faint or lightheaded, blurry vision Shortness of breath Side effects that usually do not require medical attention (report to your care team if they continue or are bothersome): Flushing Headache Joint pain Muscle pain Nausea Pain, redness, or irritation at injection site This list may not describe all possible side effects. Call your doctor for medical advice about side effects. You may report side effects to FDA at 1-800-FDA-1088. Where should I keep my medication? This medication is given in a hospital or clinic. It will not be stored at home. NOTE: This sheet is a summary. It may not cover all possible information. If you have questions about this medicine, talk to your doctor, pharmacist, or health care provider.  2024 Elsevier/Gold Standard (2022-07-17 00:00:00)  Testosterone Capsules What is this medication? TESTOSTERONE (tes TOS ter one)  is used to increase testosterone levels in your body. It belongs to a group of medications called androgen hormones. This medicine may be used for other purposes; ask your health care provider or pharmacist if you have questions. COMMON BRAND NAME(S): Ramsey Cellar What should I tell my care team before I take this medication? They need to know if you have any of these conditions: Cancer Depression Diabetes Heart disease High blood pressure High red blood cell levels Kidney disease Liver disease Lung or breathing disease Prostate disease Suicidal thoughts, plans, or attempt by you or a family member An unusual or allergic reaction to testosterone, other medications, foods, dyes, or preservatives If you or your partner are pregnant or trying to get pregnant Breastfeeding How should I use this medication? Take this medication by mouth with a glass of water. Take it as directed on the prescription label at the same time every day. Take it with  food. Do not take your medication more often than directed. Keep taking it unless your care team tells you to stop. A special MedGuide will be given to you by the pharmacist with each prescription and refill. Be sure to read this information carefully each time. Talk to your care team about the use of this medication in children. Special care may be needed. Overdosage: If you think you have taken too much of this medicine contact a poison control center or emergency room at once. NOTE: This medicine is only for you. Do not share this medicine with others. What if I miss a dose? If you miss a dose, take it as soon as you can. If it is almost time for your next dose, take only that dose. Do not take double or extra doses. Call your care team if you are not sure how to handle a missed dose. What may interact with this medication? Certain medications for colds or congestion, like ephedrine, phenylephrine, or pseudoephedrine Certain medications that treat or prevent blood clots like warfarin Medications for diabetes Steroid medications like prednisone or cortisone This list may not describe all possible interactions. Give your health care provider a list of all the medicines, herbs, non-prescription drugs, or dietary supplements you use. Also tell them if you smoke, drink alcohol, or use illegal drugs. Some items may interact with your medicine. What should I watch for while using this medication? Visit your care team for regular checks on your progress. They will need to check the level of testosterone in your blood. Heart attacks and strokes have been reported with the use of this medication. Get emergency help if you develop signs or symptoms of a heart attack or stroke. Talk to your care team about the risks and benefits of this medication. This medication may affect blood sugar levels. If you have diabetes, check with your care team before you change your diet or the dose of your diabetic  medication. This medication is banned from use in athletes by most athletic organizations. What side effects may I notice from receiving this medication? Side effects that you should report to your care team as soon as possible: Allergic reactions--skin rash, itching, hives, swelling of the face, lips, tongue, or throat Blood clot--pain, swelling, or warmth in the leg, shortness of breath, chest pain Heart attack--pain or tightness in the chest, shoulders, arms, or jaw, nausea, shortness of breath, cold or clammy skin, feeling faint or lightheaded Increase in blood pressure Liver injury--right upper belly pain, loss of appetite, nausea, light-colored stool, dark yellow or brown  urine, yellowing skin or eyes, unusual weakness or fatigue Mood swings, irritability, or hostility Prolonged or painful erection Sleep apnea--loud snoring, gasping during sleep, daytime sleepiness Stroke--sudden numbness or weakness of the face, arm, or leg, trouble speaking, confusion, trouble walking, loss of balance or coordination, dizziness, severe headache, change in vision Swelling of the ankles, hands or feet Thoughts of suicide or self-harm, worsening mood, feelings of depression Side effects that usually do not require medical attention (report these to your care team if they continue or are bothersome): Acne Burping Change in sex drive or performance Diarrhea Unexpected breast tissue growth Upset stomach This list may not describe all possible side effects. Call your doctor for medical advice about side effects. You may report side effects to FDA at 1-800-FDA-1088. Where should I keep my medication? Keep out of the reach of children and pets. Store between 15 and 30 degrees C (59 and 86 degrees F). Avoid exposing the capsules to moisture (store in a dry place). Throw away any unused medication after the expiration date on the label. NOTE: This sheet is a summary. It may not cover all possible information.  If you have questions about this medicine, talk to your doctor, pharmacist, or health care provider.  2024 Elsevier/Gold Standard (2021-12-16 00:00:00)

## 2022-11-26 NOTE — Telephone Encounter (Signed)
Attempted to notify Patient of prior authorization approval for Dronabinol 5 mg Capsules. Unable to reach Patient by telephone and unable to leave a voicemail. Recording states that the number you have reached cannot be completed as dialed. Attempted to call home phone with no answer and no voicemail option. Medication is approved through 05/27/2023.

## 2022-11-26 NOTE — Patient Instructions (Signed)

## 2022-11-26 NOTE — Progress Notes (Signed)
Ok to treat with creatinine of 3.06 per Dr Myna Hidalgo. Dph  1152 -OK to repeat pt's pain med per Dr Myna Hidalgo.

## 2022-11-27 LAB — KAPPA/LAMBDA LIGHT CHAINS
Kappa free light chain: 73.8 mg/L — ABNORMAL HIGH (ref 3.3–19.4)
Kappa, lambda light chain ratio: 2.65 — ABNORMAL HIGH (ref 0.26–1.65)
Lambda free light chains: 27.8 mg/L — ABNORMAL HIGH (ref 5.7–26.3)

## 2022-11-27 LAB — TESTOSTERONE: Testosterone: 270 ng/dL (ref 264–916)

## 2022-11-28 LAB — IGG, IGA, IGM
IgA: 162 mg/dL (ref 61–437)
IgG (Immunoglobin G), Serum: 946 mg/dL (ref 603–1613)
IgM (Immunoglobulin M), Srm: 20 mg/dL (ref 15–143)

## 2022-12-10 ENCOUNTER — Encounter: Payer: Self-pay | Admitting: Family

## 2022-12-10 ENCOUNTER — Inpatient Hospital Stay: Payer: 59

## 2022-12-10 ENCOUNTER — Encounter: Payer: Self-pay | Admitting: Hematology & Oncology

## 2022-12-10 VITALS — BP 161/67 | HR 93 | Temp 97.6°F | Resp 17

## 2022-12-10 DIAGNOSIS — Z5112 Encounter for antineoplastic immunotherapy: Secondary | ICD-10-CM | POA: Diagnosis not present

## 2022-12-10 DIAGNOSIS — C9000 Multiple myeloma not having achieved remission: Secondary | ICD-10-CM

## 2022-12-10 DIAGNOSIS — D631 Anemia in chronic kidney disease: Secondary | ICD-10-CM

## 2022-12-10 DIAGNOSIS — M4712 Other spondylosis with myelopathy, cervical region: Secondary | ICD-10-CM

## 2022-12-10 DIAGNOSIS — Z299 Encounter for prophylactic measures, unspecified: Secondary | ICD-10-CM

## 2022-12-10 DIAGNOSIS — E349 Endocrine disorder, unspecified: Secondary | ICD-10-CM

## 2022-12-10 LAB — CMP (CANCER CENTER ONLY)
ALT: 18 U/L (ref 0–44)
AST: 28 U/L (ref 15–41)
Albumin: 3.8 g/dL (ref 3.5–5.0)
Alkaline Phosphatase: 74 U/L (ref 38–126)
Anion gap: 10 (ref 5–15)
BUN: 32 mg/dL — ABNORMAL HIGH (ref 8–23)
CO2: 28 mmol/L (ref 22–32)
Calcium: 8.3 mg/dL — ABNORMAL LOW (ref 8.9–10.3)
Chloride: 104 mmol/L (ref 98–111)
Creatinine: 3.52 mg/dL — ABNORMAL HIGH (ref 0.61–1.24)
GFR, Estimated: 18 mL/min — ABNORMAL LOW (ref >60)
Glucose, Bld: 119 mg/dL — ABNORMAL HIGH (ref 70–99)
Potassium: 3.7 mmol/L (ref 3.5–5.1)
Sodium: 142 mmol/L (ref 135–145)
Total Bilirubin: 0.4 mg/dL (ref 0.3–1.2)
Total Protein: 6.6 g/dL (ref 6.5–8.1)

## 2022-12-10 LAB — CBC WITH DIFFERENTIAL (CANCER CENTER ONLY)
Abs Immature Granulocytes: 0.02 10*3/uL (ref 0.00–0.07)
Basophils Absolute: 0.1 10*3/uL (ref 0.0–0.1)
Basophils Relative: 1 %
Eosinophils Absolute: 0.3 10*3/uL (ref 0.0–0.5)
Eosinophils Relative: 5 %
HCT: 27.3 % — ABNORMAL LOW (ref 39.0–52.0)
Hemoglobin: 8.5 g/dL — ABNORMAL LOW (ref 13.0–17.0)
Immature Granulocytes: 0 %
Lymphocytes Relative: 12 %
Lymphs Abs: 0.8 10*3/uL (ref 0.7–4.0)
MCH: 24.6 pg — ABNORMAL LOW (ref 26.0–34.0)
MCHC: 31.1 g/dL (ref 30.0–36.0)
MCV: 78.9 fL — ABNORMAL LOW (ref 80.0–100.0)
Monocytes Absolute: 1 10*3/uL (ref 0.1–1.0)
Monocytes Relative: 15 %
Neutro Abs: 4.2 10*3/uL (ref 1.7–7.7)
Neutrophils Relative %: 67 %
Platelet Count: 195 10*3/uL (ref 150–400)
RBC: 3.46 MIL/uL — ABNORMAL LOW (ref 4.22–5.81)
RDW: 17.9 % — ABNORMAL HIGH (ref 11.5–15.5)
WBC Count: 6.4 10*3/uL (ref 4.0–10.5)
nRBC: 0 % (ref 0.0–0.2)

## 2022-12-10 MED ORDER — SODIUM CHLORIDE 0.9 % IV SOLN
60.0000 mg | Freq: Once | INTRAVENOUS | Status: AC
  Administered 2022-12-10: 60 mg via INTRAVENOUS
  Filled 2022-12-10: qty 20

## 2022-12-10 MED ORDER — EPOETIN ALFA-EPBX 40000 UNIT/ML IJ SOLN
40000.0000 [IU] | Freq: Once | INTRAMUSCULAR | Status: AC
  Administered 2022-12-10: 40000 [IU] via SUBCUTANEOUS
  Filled 2022-12-10: qty 1

## 2022-12-10 MED ORDER — PALONOSETRON HCL INJECTION 0.25 MG/5ML
0.2500 mg | Freq: Once | INTRAVENOUS | Status: AC
  Administered 2022-12-10: 0.25 mg via INTRAVENOUS
  Filled 2022-12-10: qty 5

## 2022-12-10 MED ORDER — SODIUM CHLORIDE 0.9% FLUSH
10.0000 mL | INTRAVENOUS | Status: DC | PRN
  Administered 2022-12-10: 10 mL

## 2022-12-10 MED ORDER — SODIUM CHLORIDE 0.9 % IV SOLN
10.0000 mg | Freq: Once | INTRAVENOUS | Status: AC
  Administered 2022-12-10: 10 mg via INTRAVENOUS
  Filled 2022-12-10: qty 10

## 2022-12-10 MED ORDER — CYANOCOBALAMIN 1000 MCG/ML IJ SOLN
1000.0000 ug | Freq: Once | INTRAMUSCULAR | Status: AC
  Administered 2022-12-10: 1000 ug via INTRAMUSCULAR
  Filled 2022-12-10: qty 1

## 2022-12-10 MED ORDER — SODIUM CHLORIDE 0.9 % IV SOLN
300.0000 mg/m2 | Freq: Once | INTRAVENOUS | Status: AC
  Administered 2022-12-10: 580 mg via INTRAVENOUS
  Filled 2022-12-10: qty 7

## 2022-12-10 MED ORDER — HEPARIN SOD (PORK) LOCK FLUSH 100 UNIT/ML IV SOLN
500.0000 [IU] | Freq: Once | INTRAVENOUS | Status: AC | PRN
  Administered 2022-12-10: 500 [IU]

## 2022-12-10 MED ORDER — HYDROMORPHONE HCL 1 MG/ML IJ SOLN
2.0000 mg | Freq: Once | INTRAMUSCULAR | Status: AC
  Administered 2022-12-10: 2 mg via INTRAVENOUS
  Filled 2022-12-10: qty 2

## 2022-12-10 MED ORDER — SODIUM CHLORIDE 0.9 % IV SOLN: INTRAVENOUS | Status: DC

## 2022-12-10 MED ORDER — DEXTROSE 5 % IV SOLN
36.0000 mg/m2 | Freq: Once | INTRAVENOUS | Status: AC
  Administered 2022-12-10: 70 mg via INTRAVENOUS
  Filled 2022-12-10: qty 30

## 2022-12-10 NOTE — Patient Instructions (Signed)
Implanted [facility] Guide An implanted port is a device that is placed under the skin. It is usually placed in the chest. The device may vary based on the need. Implanted ports can be used to give IV medicine, to take blood, or to give fluids. You may have an implanted port if: You need IV medicine that would be irritating to the small veins in your hands or arms. You need IV medicines, such as chemotherapy, for a long period of time. You need IV nutrition for a long period of time. You may have fewer limitations when using a port than you would if you used other types of long-term IVs. You will also likely be able to return to normal activities after your incision heals. An implanted port has two main parts: Reservoir. The reservoir is the part where a needle is inserted to give medicines or draw blood. The reservoir is round. After the port is placed, it appears as a small, raised area under your skin. Catheter. The catheter is a small, thin tube that connects the reservoir to a vein. Medicine that is inserted into the reservoir goes into the catheter and then into the vein. How is my port accessed? To access your port: A numbing cream may be placed on the skin over the port site. Your health care provider will put on a mask and sterile gloves. The skin over your port will be cleaned carefully with a germ-killing soap and allowed to dry. Your health care provider will gently pinch the port and insert a needle into it. Your health care provider will check for a blood return to make sure the port is in the vein and is still working (patent). If your port needs to remain accessed to get medicine continuously (constant infusion), your health care provider will place a clear bandage (dressing) over the needle site. The dressing and needle will need to be changed every week, or as told by your health care provider. What is flushing? Flushing helps keep the port working. Follow instructions from your  health care provider about how and when to flush the port. Ports are usually flushed with saline solution or a medicine called heparin. The need for flushing will depend on how the port is used: If the port is only used from time to time to give medicines or draw blood, the port may need to be flushed: Before and after medicines have been given. Before and after blood has been drawn. As part of routine maintenance. Flushing may be recommended every 4-6 weeks. If a constant infusion is running, the port may not need to be flushed. Throw away any syringes in a disposal container that is meant for sharp items (sharps container). You can buy a sharps container from a pharmacy, or you can make one by using an empty hard plastic bottle with a cover. How long will my port stay implanted? The port can stay in for as long as your health care provider thinks it is needed. When it is time for the port to come out, a surgery will be done to remove it. The surgery will be similar to the procedure that was done to put the port in. Follow these instructions at home: Caring for your port and port site Flush your port as told by your health care provider. If you need an infusion over several days, follow instructions from your health care provider about how to take care of your port site. Make sure you: Change your  dressing as told by your health care provider. Wash your hands with soap and water for at least 20 seconds before and after you change your dressing. If soap and water are not available, use alcohol-based hand sanitizer. Place any used dressings or infusion bags into a plastic bag. Throw that bag in the trash. Keep the dressing that covers the needle clean and dry. Do not get it wet. Do not use scissors or sharp objects near the infusion tubing. Keep any external tubes clamped, unless they are being used. Check your port site every day for signs of infection. Check for: Redness, swelling, or  pain. Fluid or blood. Warmth. Pus or a bad smell. Protect the skin around the port site. Avoid wearing bra straps that rub or irritate the site. Protect the skin around your port from seat belts. Place a soft pad over your chest if needed. Bathe or shower as told by your health care provider. The site may get wet as long as you are not actively receiving an infusion. General instructions  Return to your normal activities as told by your health care provider. Ask your health care provider what activities are safe for you. Carry a medical alert card or wear a medical alert bracelet at all times. This will let health care providers know that you have an implanted port in case of an emergency. Where to find more information American Cancer Society: www.cancer.org American Society of Clinical Oncology: www.cancer.net Contact a health care provider if: You have a fever or chills. You have redness, swelling, or pain at the port site. You have fluid or blood coming from your port site. Your incision feels warm to the touch. You have pus or a bad smell coming from the port site. Summary Implanted ports are usually placed in the chest for long-term IV access. Follow instructions from your health care provider about flushing the port and changing bandages (dressings). Take care of the area around your port by avoiding clothing that puts pressure on the area, and by watching for signs of infection. Protect the skin around your port from seat belts. Place a soft pad over your chest if needed. Contact a health care provider if you have a fever or you have redness, swelling, pain, fluid, or a bad smell at the port site. This information is not intended to replace advice given to you by your health care provider. Make sure you discuss any questions you have with your health care provider. Document Revised: 08/13/2020 Document Reviewed: 08/13/2020 Elsevier Patient Education  2024 [employer].

## 2022-12-10 NOTE — Progress Notes (Signed)
Ok to treat with creatinine 3.52 and calcium 8.3 per Dr. Myna Hidalgo

## 2022-12-10 NOTE — Progress Notes (Signed)
Hematology and Oncology Follow Up Visit  [name] [id number] 05-03-1950 72 y.o. 12/10/2022   Principle Diagnosis:  IgG kappa myeloma Anemia secondary to renal insufficiency Intermittent iron - deficiency anemia Hypotestosteronemia   Current Therapy:        Aredia 60 mg IV q  month  -- on hold starting 03/05/2022 -- needs tooth extrated --tooth extracted 03/2022 -started on 07/2022 Retacrit SQ as needed for hemoglobin less than 10 DepoTestosterone 400 mg q 4 weeks IV iron as indicated Kyprolis/Cytoxan - s/p cycle #40   Interim History:  [name] is here today for follow-up and treatment.  [name] is doing okay.  That wet weather  outside this morning has made his arthritis little bit more challenging.  [name] is tolerating his treatments well.  So far, there is been no evidence that his myeloma has progressed.  There is no monoclonal spike in his blood.  His last IgG level was 1000 mg/dL.  The Kappa light chain was 4.8 mg/dL.  His last testosterone level was 300.    [name] is s watching his diabetes.  [name] is watching what [name] tries to eat.  [name] has had no fever.  [name] has had no cough.  [name] has had no change in bowel or bladder habits.  There is been no leg swelling.  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    Overall, I would say that his performance status is probably ECOG 1-2.    Medications:  Allergies as of 11/26/2022       Reactions   Iodinated Contrast Media Rash   Patient states [name] was instructed not to take IV contrast.  In 2008 [name] had an unknown reaction, and was told not to take it again.  [name] was also told not to take it due to his kidneys.   Iodine Anxiety, Rash, Other (See Comments)   Didn't feel right "instructed not to take per MD--something with his port"        Medication List        Accurate as of November 26, 2022 11:59 PM. If you have any questions, ask your nurse or doctor.          amLODipine 10 MG tablet Commonly known as:  NORVASC Take 10 mg by mouth daily.   ammonium lactate 12 % lotion Commonly known as: AmLactin Apply 1 application topically as needed for dry skin.   atorvastatin 40 MG tablet Commonly known as: LIPITOR Take 40 mg by mouth daily at 6 PM.   budesonide-formoterol 160-4.5 MCG/ACT inhaler Commonly known as: SYMBICORT SMARTSIG:2 Puff(s) By Mouth Twice Daily   Carafate 1 GM/10ML suspension Generic drug: sucralfate Take 1 g by mouth 4 (four) times daily.   carvedilol 25 MG tablet Commonly known as: COREG Take 25 mg by mouth 2 (two) times daily.   chlorpheniramine-HYDROcodone 10-8 MG/5ML Commonly known as: TUSSIONEX SMARTSIG:5 Milliliter(s) By Mouth Every 12 Hours   Comirnaty syringe Generic drug: COVID-19 mRNA vaccine (Pfizer) Inject 0.3 mLs into the muscle once for 1 dose.   [employer] Fiber Gummies 2 g Chew Chew by mouth daily.   cyclobenzaprine 10 MG tablet Commonly known as: FLEXERIL Take 10 mg by mouth.   diazepam 5 MG tablet Commonly known as: VALIUM Take by mouth at bedtime as needed.   diclofenac Sodium 1 % Gel Commonly known as: VOLTAREN Apply topically.   dicyclomine 10 MG capsule Commonly known as: BENTYL TAKE 1 CAPSULE BY MOUTH 3 TIMES A DAY BEFORE  MEALS   dronabinol 5 MG capsule Commonly known as: MARINOL Take 1 capsule (5 mg total) by mouth 2 (two) times daily before a meal.   Fluad 0.5 ML injection Generic drug: influenza vaccine adjuvanted Inject 0.5 mLs into the muscle once for 1 dose.   gabapentin 300 MG capsule Commonly known as: NEURONTIN TAKE 1 CAPSULE BY MOUTH THREE TIMES A DAY What changed:  how much to take how to take this when to take this additional instructions   glimepiride 2 MG tablet Commonly known as: AMARYL Take 2 mg by mouth daily.   hydrocortisone 2.5 % cream Apply topically 2 (two) times daily.   Januvia 100 MG tablet Generic drug: sitaGLIPtin Take 100 mg by mouth daily.   Klor-Con M20 20 MEQ tablet Generic drug:  potassium chloride SA Take 20 mEq by mouth daily.   lactulose 10 GM/15ML solution Commonly known as: CHRONULAC Take 30 mLs (20 g total) by mouth daily.   latanoprost 0.005 % ophthalmic solution Commonly known as: XALATAN Place 1 drop into both eyes at bedtime.   linaclotide 145 MCG Caps capsule Commonly known as: LINZESS Take by mouth daily as needed.   losartan 50 MG tablet Commonly known as: COZAAR Take 50 mg by mouth daily.   Lumigan 0.01 % Soln Generic drug: bimatoprost Place 1 drop into both eyes 3 (three) times daily.   mesalamine 1.2 g EC tablet Commonly known as: LIALDA Take by mouth daily with breakfast.   methocarbamol 500 MG tablet Commonly known as: ROBAXIN Take 1 tablet (500 mg total) by mouth 2 (two) times daily.   metoprolol succinate 25 MG 24 hr tablet Commonly known as: TOPROL-XL Take 25 mg by mouth daily.   mirabegron ER 25 MG Tb24 tablet Commonly known as: MYRBETRIQ Take 20 mg by mouth daily.   morphine 30 MG 12 hr tablet Commonly known as: MS CONTIN Take 30 mg by mouth 2 (two) times daily.   omeprazole 40 MG capsule Commonly known as: PRILOSEC TAKE 1 CAPSULE BY MOUTH TWICE A DAY   oxyCODONE 5 MG immediate release tablet Commonly known as: Roxicodone 1 tab PO q6 hours prn pain   oxyCODONE-acetaminophen 10-325 MG tablet Commonly known as: PERCOCET Take 1 tablet by mouth every 6 (six) hours as needed for pain.   pioglitazone 30 MG tablet Commonly known as: ACTOS Take 30 mg by mouth daily.   polyethylene glycol powder 17 GM/SCOOP powder Commonly known as: GLYCOLAX/MIRALAX SMARTSIG:1 scoopful By Mouth Twice Daily PRN   prochlorperazine 10 MG tablet Commonly known as: COMPAZINE take 1 tablet by mouth every 6 hours if needed for nausea and vomiting   Restasis 0.05 % ophthalmic emulsion Generic drug: cycloSPORINE Place 1 drop into both eyes daily.   sildenafil 100 MG tablet Commonly known as: VIAGRA SMARTSIG:1 Tablet(s) By Mouth    SUMAtriptan 100 MG tablet Commonly known as: IMITREX Take 100 mg by mouth daily.   topiramate 25 MG capsule Commonly known as: TOPAMAX Take 25 mg by mouth 2 (two) times daily.   topiramate 50 MG tablet Commonly known as: TOPAMAX Take 50 mg by mouth at bedtime.   torsemide 20 MG tablet Commonly known as: DEMADEX Take 20 mg by mouth daily.   Vitamin D (Ergocalciferol) 1.25 MG (50000 UNIT) Caps capsule Commonly known as: DRISDOL Take 50,000 Units by mouth every Saturday.   Xtampza ER 13.5 MG C12a Generic drug: oxyCODONE ER daily.        Allergies:  Allergies  Allergen Reactions  Iodinated Contrast Media Rash    Patient states [name] was instructed not to take IV contrast.  In 2008 [name] had an unknown reaction, and was told not to take it again.  [name] was also told not to take it due to his kidneys.   Ioversol    Prednisone    Iodine Anxiety, Rash and Other (See Comments)    Didn't feel right "instructed not to take per MD--something with his port"     Past Medical History, Surgical history, Social history, and Family History were reviewed and updated.  Review of Systems: Review of Systems  Constitutional:  Positive for malaise/fatigue.  HENT: Negative.    Eyes: Negative.   Respiratory: Negative.    Cardiovascular: Negative.   Gastrointestinal:  Positive for nausea.  Genitourinary: Negative.   Musculoskeletal:  Positive for joint pain and myalgias.  Skin: Negative.   Neurological: Negative.   Endo/Heme/Allergies: Negative.   Psychiatric/Behavioral: Negative.       Physical Exam: Vital signs are temperature of 98.9.  Pulse 90.  Blood pressure 160/82.  Weight is 171 pounds.        Wt Readings from Last 3 Encounters:  11/26/22 167 lb (75.8 kg)  11/12/22 171 lb (77.6 kg)  10/29/22 168 lb 6.4 oz (76.4 kg)   Physical Exam Vitals reviewed.  HENT:     Head: Normocephalic and atraumatic.  Eyes:     Pupils: Pupils are equal, round, and reactive to light.   Cardiovascular:     Rate and Rhythm: Normal rate and regular rhythm.     Heart sounds: Normal heart sounds.  Pulmonary:     Effort: Pulmonary effort is normal.     Breath sounds: Normal breath sounds.  Abdominal:     General: Bowel sounds are normal.     Palpations: Abdomen is soft.  Musculoskeletal:        General: No tenderness or deformity. Normal range of motion.     Cervical back: Normal range of motion.  Lymphadenopathy:     Cervical: No cervical adenopathy.  Skin:    General: Skin is warm and dry.     Findings: No erythema or rash.  Neurological:     Mental Status: [name] is alert and oriented to person, place, and time.  Psychiatric:        Behavior: Behavior normal.        Thought Content: Thought content normal.        Judgment: Judgment normal.       Lab Results  Component Value Date   WBC 6.4 12/10/2022   HGB 8.5 (L) 12/10/2022   HCT 27.3 (L) 12/10/2022   MCV 78.9 (L) 12/10/2022   PLT 195 12/10/2022   Lab Results  Component Value Date   FERRITIN 1,196 (H) 11/26/2022   IRON 72 11/26/2022   TIBC 228 (L) 11/26/2022   UIBC 156 11/26/2022   IRONPCTSAT 32 11/26/2022   Lab Results  Component Value Date   RETICCTPCT 1.4 11/12/2022   RBC 3.46 (L) 12/10/2022   RETICCTABS 27.5 03/21/2014   Lab Results  Component Value Date   KPAFRELGTCHN 73.8 (H) 11/26/2022   LAMBDASER 27.8 (H) 11/26/2022   KAPLAMBRATIO 2.65 (H) 11/26/2022   Lab Results  Component Value Date   IGGSERUM 946 11/26/2022   IGA 162 11/26/2022   IGMSERUM 20 11/26/2022   Lab Results  Component Value Date   TOTALPROTELP 6.0 11/12/2022   ALBUMINELP 3.3 11/12/2022   A1GS 0.2 11/12/2022   A2GS 0.8 11/12/2022  BETS 0.8 11/12/2022   BETA2SER 0.5 11/07/2014   GAMS 0.8 11/12/2022   MSPIKE Not Observed 11/12/2022   SPEI Comment 01/29/2022     Chemistry      Component Value Date/Time   NA 142 12/10/2022 0820   NA 143 02/22/2017 0744   K 3.7 12/10/2022 0820   K 4.0 02/22/2017 0744   CL  104 12/10/2022 0820   CL 106 02/22/2017 0744   CO2 28 12/10/2022 0820   CO2 27 02/22/2017 0744   BUN 32 (H) 12/10/2022 0820   BUN 26 (H) 02/22/2017 0744   CREATININE 3.52 (H) 12/10/2022 0820   CREATININE 2.3 (H) 02/22/2017 0744      Component Value Date/Time   CALCIUM 8.3 (L) 12/10/2022 0820   CALCIUM 9.3 02/22/2017 0744   ALKPHOS 74 12/10/2022 0820   ALKPHOS 80 02/22/2017 0744   AST 28 12/10/2022 0820   ALT 18 12/10/2022 0820   ALT 22 02/22/2017 0744   BILITOT 0.4 12/10/2022 0820       Impression and Plan: [name] is a very pleasant 72 yo African American gentleman with IgG kappa myeloma.  We have been doing this for many years.  So far, [name] is done incredibly well.  [name] is on a very good treatment protocol that [name] is tolerating well.  We will go ahead with his treatment today.  [name] will also get his testosterone today.  [name] does need Retacrit today.  We will have to see what his iron studies look like.  We will get him back to see [place] in another month.     Josph Macho, MD 10/17/20245:59 PM

## 2022-12-10 NOTE — Patient Instructions (Signed)
[facility]  Discharge Instructions: Thank you for choosing [facility] to provide your oncology and hematology care.   If you have a lab appointment with the Cancer Center, please go directly to the Cancer Center and check in at the registration area.  Wear comfortable clothing and clothing appropriate for easy access to any Portacath or PICC line.   We strive to give you quality time with your provider. You may need to reschedule your appointment if you arrive late (15 or more minutes).  Arriving late affects you and other patients whose appointments are after yours.  Also, if you miss three or more appointments without notifying the office, you may be dismissed from the clinic at the provider's discretion.      For prescription refill requests, have your pharmacy contact our office and allow 72 hours for refills to be completed.    Today you received the following chemotherapy and/or immunotherapy agents Kyprolis, Cytoxan,       To help prevent nausea and vomiting after your treatment, we encourage you to take your nausea medication as directed.  BELOW ARE SYMPTOMS THAT SHOULD BE REPORTED IMMEDIATELY: *FEVER GREATER THAN 100.4 F (38 C) OR HIGHER *CHILLS OR SWEATING *NAUSEA AND VOMITING THAT IS NOT CONTROLLED WITH YOUR NAUSEA MEDICATION *UNUSUAL SHORTNESS OF BREATH *UNUSUAL BRUISING OR BLEEDING *URINARY PROBLEMS (pain or burning when urinating, or frequent urination) *BOWEL PROBLEMS (unusual diarrhea, constipation, pain near the anus) TENDERNESS IN MOUTH AND THROAT WITH OR WITHOUT PRESENCE OF ULCERS (sore throat, sores in mouth, or a toothache) UNUSUAL RASH, SWELLING OR PAIN  UNUSUAL VAGINAL DISCHARGE OR ITCHING   Items with * indicate a potential emergency and should be followed up as soon as possible or go to the Emergency Department if any problems should occur.  Please show the CHEMOTHERAPY ALERT CARD or IMMUNOTHERAPY ALERT CARD  at check-in to the Emergency Department and triage nurse. Should you have questions after your visit or need to cancel or reschedule your appointment, please contact [facility] [place]  [phone] and follow the prompts.  Office hours are 8:00 a.m. to 4:30 p.m. Monday - Friday. Please note that voicemails left after 4:00 p.m. may not be returned until the following business day.  We are closed weekends and major holidays. You have access to a nurse at all times for urgent questions. Please call the main number to the clinic [phone] and follow the prompts.  For any non-urgent questions, you may also contact your provider using MyChart. We now offer e-Visits for anyone 101 and older to request care online for non-urgent symptoms. For details visit [email or web address].   Also download the MyChart app! Go to the app store, search "MyChart", open the app, select , and log in with your MyChart username and password.

## 2022-12-24 ENCOUNTER — Inpatient Hospital Stay: Payer: 59

## 2022-12-24 ENCOUNTER — Inpatient Hospital Stay: Payer: 59 | Admitting: Hematology & Oncology

## 2022-12-24 ENCOUNTER — Encounter: Payer: Self-pay | Admitting: Hematology & Oncology

## 2022-12-24 DIAGNOSIS — C9000 Multiple myeloma not having achieved remission: Secondary | ICD-10-CM

## 2022-12-24 DIAGNOSIS — Z299 Encounter for prophylactic measures, unspecified: Secondary | ICD-10-CM

## 2022-12-24 DIAGNOSIS — D631 Anemia in chronic kidney disease: Secondary | ICD-10-CM

## 2022-12-24 DIAGNOSIS — Z5112 Encounter for antineoplastic immunotherapy: Secondary | ICD-10-CM | POA: Diagnosis not present

## 2022-12-24 DIAGNOSIS — E349 Endocrine disorder, unspecified: Secondary | ICD-10-CM

## 2022-12-24 LAB — CMP (CANCER CENTER ONLY)
ALT: 14 U/L (ref 0–44)
AST: 17 U/L (ref 15–41)
Albumin: 4.1 g/dL (ref 3.5–5.0)
Alkaline Phosphatase: 79 U/L (ref 38–126)
Anion gap: 7 (ref 5–15)
BUN: 36 mg/dL — ABNORMAL HIGH (ref 8–23)
CO2: 29 mmol/L (ref 22–32)
Calcium: 8.7 mg/dL — ABNORMAL LOW (ref 8.9–10.3)
Chloride: 103 mmol/L (ref 98–111)
Creatinine: 2.43 mg/dL — ABNORMAL HIGH (ref 0.61–1.24)
GFR, Estimated: 27 mL/min — ABNORMAL LOW (ref >60)
Glucose, Bld: 106 mg/dL — ABNORMAL HIGH (ref 70–99)
Potassium: 4 mmol/L (ref 3.5–5.1)
Sodium: 139 mmol/L (ref 135–145)
Total Bilirubin: 0.4 mg/dL (ref 0.3–1.2)
Total Protein: 6.7 g/dL (ref 6.5–8.1)

## 2022-12-24 LAB — CBC WITH DIFFERENTIAL (CANCER CENTER ONLY)
Abs Immature Granulocytes: 0.04 10*3/uL (ref 0.00–0.07)
Basophils Absolute: 0.1 10*3/uL (ref 0.0–0.1)
Basophils Relative: 1 %
Eosinophils Absolute: 0.2 10*3/uL (ref 0.0–0.5)
Eosinophils Relative: 3 %
HCT: 29.6 % — ABNORMAL LOW (ref 39.0–52.0)
Hemoglobin: 9.4 g/dL — ABNORMAL LOW (ref 13.0–17.0)
Immature Granulocytes: 1 %
Lymphocytes Relative: 12 %
Lymphs Abs: 0.7 10*3/uL (ref 0.7–4.0)
MCH: 25 pg — ABNORMAL LOW (ref 26.0–34.0)
MCHC: 31.8 g/dL (ref 30.0–36.0)
MCV: 78.7 fL — ABNORMAL LOW (ref 80.0–100.0)
Monocytes Absolute: 0.8 10*3/uL (ref 0.1–1.0)
Monocytes Relative: 13 %
Neutro Abs: 4 10*3/uL (ref 1.7–7.7)
Neutrophils Relative %: 70 %
Platelet Count: 183 10*3/uL (ref 150–400)
RBC: 3.76 MIL/uL — ABNORMAL LOW (ref 4.22–5.81)
RDW: 18.4 % — ABNORMAL HIGH (ref 11.5–15.5)
WBC Count: 5.7 10*3/uL (ref 4.0–10.5)
nRBC: 0 % (ref 0.0–0.2)

## 2022-12-24 LAB — FERRITIN: Ferritin: 1310 ng/mL — ABNORMAL HIGH (ref 24–336)

## 2022-12-24 LAB — IRON AND IRON BINDING CAPACITY (CC-WL,HP ONLY)
Iron: 66 ug/dL (ref 45–182)
Saturation Ratios: 29 % (ref 17.9–39.5)
TIBC: 227 ug/dL — ABNORMAL LOW (ref 250–450)
UIBC: 161 ug/dL (ref 117–376)

## 2022-12-24 LAB — LACTATE DEHYDROGENASE: LDH: 208 U/L — ABNORMAL HIGH (ref 98–192)

## 2022-12-24 MED ORDER — SODIUM CHLORIDE 0.9% FLUSH
10.0000 mL | Freq: Once | INTRAVENOUS | Status: AC
  Administered 2022-12-24: 10 mL via INTRAVENOUS

## 2022-12-24 MED ORDER — CYANOCOBALAMIN 1000 MCG/ML IJ SOLN
1000.0000 ug | Freq: Once | INTRAMUSCULAR | Status: AC
Start: 2022-12-24 — End: 2022-12-24
  Administered 2022-12-24: 1000 ug via INTRAMUSCULAR
  Filled 2022-12-24: qty 1

## 2022-12-24 MED ORDER — TESTOSTERONE CYPIONATE 200 MG/ML IM SOLN
400.0000 mg | Freq: Once | INTRAMUSCULAR | Status: AC
  Administered 2022-12-24: 400 mg via INTRAMUSCULAR
  Filled 2022-12-24: qty 2

## 2022-12-24 MED ORDER — HEPARIN SOD (PORK) LOCK FLUSH 100 UNIT/ML IV SOLN
500.0000 [IU] | Freq: Once | INTRAVENOUS | Status: AC
  Administered 2022-12-24: 500 [IU] via INTRAVENOUS

## 2022-12-24 MED ORDER — HYDROMORPHONE HCL 1 MG/ML IJ SOLN
2.0000 mg | Freq: Once | INTRAMUSCULAR | Status: AC
  Administered 2022-12-24: 2 mg via INTRAVENOUS
  Filled 2022-12-24: qty 2

## 2022-12-24 MED ORDER — SODIUM CHLORIDE 0.9 % IV SOLN: INTRAVENOUS | Status: DC

## 2022-12-24 MED ORDER — EPOETIN ALFA-EPBX 40000 UNIT/ML IJ SOLN
40000.0000 [IU] | Freq: Once | INTRAMUSCULAR | Status: AC
  Administered 2022-12-24: 40000 [IU] via SUBCUTANEOUS
  Filled 2022-12-24: qty 1

## 2022-12-24 NOTE — Progress Notes (Signed)
Hematology and Oncology Follow Up Visit  [name] [id number] 06/17/1950 72 y.o. 12/24/2022   Principle Diagnosis:  IgG kappa myeloma Anemia secondary to renal insufficiency Intermittent iron - deficiency anemia Hypotestosteronemia   Current Therapy:        Aredia 60 mg IV q  month  -- on hold starting 03/05/2022 -- needs tooth extrated --tooth extracted 03/2022 -started on 07/2022 Retacrit SQ as needed for hemoglobin less than 10 DepoTestosterone 400 mg q 4 weeks IV iron as indicated Kyprolis/Cytoxan - s/p cycle #40 --DC on 12/24/2022   Interim History:  [name] is here today for follow-up and treatment.  However, I really think that we should stop his treatment.  He has done well with the treatment.  I just worried that the weight loss might be secondary to him having all of this chemotherapy.  Given that the fact that his myeloma studies have not shown a monoclonal spike in his blood, I really think we have the flexibility of give him a "vacation" from treatment.  We still have to make sure he gets his testosterone and the Aredia.  This really does help his bony pain from his arthritis.  He also is on Retacrit.  Everything is doing well with these.  Again is very happy that we can stop his treatments right now.  Will try to keep him off treatment as long as possible to see if his weight will improve.  He has had no obvious change in bowel or bladder habits.  He does have diabetes.  He is trying to watch his diabetes.  He has had no fever.  He has had no bleeding.  He has had no nausea or vomiting.  His last testosterone level back in early October was 270.  When we last checked his myeloma studies in September, there was no monoclonal spike in his blood.  His IgG level was 946 mg/dL.  The Kappa light chain was 7.4 mg/dL.  This is up a little bit.  Again we will have to watch this closely.  Currently, I would say his performance status is probably ECOG 1.      Medications:  Allergies as of 12/24/2022       Reactions   Iodinated Contrast Media Rash   Patient states he was instructed not to take IV contrast.  In 2008 he had an unknown reaction, and was told not to take it again.  He was also told not to take it due to his kidneys.   Ioversol    Prednisone    Iodine Anxiety, Rash, Other (See Comments)   Didn't feel right "instructed not to take per MD--something with his port"        Medication List        Accurate as of December 24, 2022  8:52 AM. If you have any questions, ask your nurse or doctor.          amLODipine 10 MG tablet Commonly known as: NORVASC Take 10 mg by mouth daily.   ammonium lactate 12 % lotion Commonly known as: AmLactin Apply 1 application topically as needed for dry skin.   atorvastatin 40 MG tablet Commonly known as: LIPITOR Take 40 mg by mouth daily at 6 PM.   budesonide-formoterol 160-4.5 MCG/ACT inhaler Commonly known as: SYMBICORT SMARTSIG:2 Puff(s) By Mouth Twice Daily   Carafate 1 GM/10ML suspension Generic drug: sucralfate Take 1 g by mouth 4 (four) times daily.   carvedilol 25 MG tablet Commonly known as: COREG  Take 25 mg by mouth 2 (two) times daily.   chlorpheniramine-HYDROcodone 10-8 MG/5ML Commonly known as: TUSSIONEX SMARTSIG:5 Milliliter(s) By Mouth Every 12 Hours   [employer] Fiber Gummies 2 g Chew Chew by mouth daily.   cyclobenzaprine 10 MG tablet Commonly known as: FLEXERIL Take 10 mg by mouth 3 (three) times daily as needed.   diazepam 5 MG tablet Commonly known as: VALIUM Take by mouth at bedtime as needed.   diclofenac Sodium 1 % Gel Commonly known as: VOLTAREN Apply topically.   dicyclomine 10 MG capsule Commonly known as: BENTYL TAKE 1 CAPSULE BY MOUTH 3 TIMES A DAY BEFORE MEALS   dronabinol 5 MG capsule Commonly known as: MARINOL Take 1 capsule (5 mg total) by mouth 2 (two) times daily before a meal.   gabapentin 300 MG capsule Commonly known as:  NEURONTIN TAKE 1 CAPSULE BY MOUTH THREE TIMES A DAY What changed:  how much to take how to take this when to take this additional instructions   glimepiride 2 MG tablet Commonly known as: AMARYL Take 2 mg by mouth daily.   hydrocortisone 2.5 % cream Apply topically 2 (two) times daily.   Januvia 100 MG tablet Generic drug: sitaGLIPtin Take 100 mg by mouth daily.   Klor-Con M20 20 MEQ tablet Generic drug: potassium chloride SA Take 20 mEq by mouth daily.   lactulose 10 GM/15ML solution Commonly known as: CHRONULAC Take 30 mLs (20 g total) by mouth daily.   latanoprost 0.005 % ophthalmic solution Commonly known as: XALATAN Place 1 drop into both eyes at bedtime.   linaclotide 145 MCG Caps capsule Commonly known as: LINZESS Take by mouth daily as needed.   losartan 50 MG tablet Commonly known as: COZAAR Take 50 mg by mouth daily.   Lumigan 0.01 % Soln Generic drug: bimatoprost Place 1 drop into both eyes 3 (three) times daily.   mesalamine 1.2 g EC tablet Commonly known as: LIALDA Take by mouth daily with breakfast.   methocarbamol 500 MG tablet Commonly known as: ROBAXIN Take 1 tablet (500 mg total) by mouth 2 (two) times daily.   metoprolol succinate 25 MG 24 hr tablet Commonly known as: TOPROL-XL Take 25 mg by mouth daily.   mirabegron ER 25 MG Tb24 tablet Commonly known as: MYRBETRIQ Take 20 mg by mouth daily.   morphine 30 MG 12 hr tablet Commonly known as: MS CONTIN Take 30 mg by mouth 2 (two) times daily.   omeprazole 40 MG capsule Commonly known as: PRILOSEC TAKE 1 CAPSULE BY MOUTH TWICE A DAY   oxyCODONE 5 MG immediate release tablet Commonly known as: Roxicodone 1 tab PO q6 hours prn pain   oxyCODONE-acetaminophen 10-325 MG tablet Commonly known as: PERCOCET Take 1 tablet by mouth every 6 (six) hours as needed for pain.   pioglitazone 30 MG tablet Commonly known as: ACTOS Take 30 mg by mouth daily.   polyethylene glycol powder 17  GM/SCOOP powder Commonly known as: GLYCOLAX/MIRALAX SMARTSIG:1 scoopful By Mouth Twice Daily PRN   prochlorperazine 10 MG tablet Commonly known as: COMPAZINE take 1 tablet by mouth every 6 hours if needed for nausea and vomiting   Restasis 0.05 % ophthalmic emulsion Generic drug: cycloSPORINE Place 1 drop into both eyes daily.   sildenafil 100 MG tablet Commonly known as: VIAGRA SMARTSIG:1 Tablet(s) By Mouth   SUMAtriptan 100 MG tablet Commonly known as: IMITREX Take 100 mg by mouth daily.   topiramate 25 MG capsule Commonly known as: TOPAMAX Take 25 mg  by mouth 2 (two) times daily.   topiramate 50 MG tablet Commonly known as: TOPAMAX Take 50 mg by mouth at bedtime.   torsemide 20 MG tablet Commonly known as: DEMADEX Take 20 mg by mouth daily.   Vitamin D (Ergocalciferol) 1.25 MG (50000 UNIT) Caps capsule Commonly known as: DRISDOL Take 50,000 Units by mouth every Saturday.   Xtampza ER 13.5 MG C12a Generic drug: oxyCODONE ER daily.        Allergies:  Allergies  Allergen Reactions   Iodinated Contrast Media Rash    Patient states he was instructed not to take IV contrast.  In 2008 he had an unknown reaction, and was told not to take it again.  He was also told not to take it due to his kidneys.   Ioversol    Prednisone    Iodine Anxiety, Rash and Other (See Comments)    Didn't feel right "instructed not to take per MD--something with his port"     Past Medical History, Surgical history, Social history, and Family History were reviewed and updated.  Review of Systems: Review of Systems  Constitutional:  Positive for malaise/fatigue.  HENT: Negative.    Eyes: Negative.   Respiratory: Negative.    Cardiovascular: Negative.   Gastrointestinal:  Positive for nausea.  Genitourinary: Negative.   Musculoskeletal:  Positive for joint pain and myalgias.  Skin: Negative.   Neurological: Negative.   Endo/Heme/Allergies: Negative.   Psychiatric/Behavioral:  Negative.       Physical Exam: Vital signs are temperature of 97.9.  Pulse 80.  Blood pressure 151/82.  Weight is 164 pounds.    Wt Readings from Last 3 Encounters:  12/24/22 163 lb 12.8 oz (74.3 kg)  11/26/22 167 lb (75.8 kg)  11/12/22 171 lb (77.6 kg)   Physical Exam Vitals reviewed.  HENT:     Head: Normocephalic and atraumatic.  Eyes:     Pupils: Pupils are equal, round, and reactive to light.  Cardiovascular:     Rate and Rhythm: Normal rate and regular rhythm.     Heart sounds: Normal heart sounds.  Pulmonary:     Effort: Pulmonary effort is normal.     Breath sounds: Normal breath sounds.  Abdominal:     General: Bowel sounds are normal.     Palpations: Abdomen is soft.  Musculoskeletal:        General: No tenderness or deformity. Normal range of motion.     Cervical back: Normal range of motion.  Lymphadenopathy:     Cervical: No cervical adenopathy.  Skin:    General: Skin is warm and dry.     Findings: No erythema or rash.  Neurological:     Mental Status: He is alert and oriented to person, place, and time.  Psychiatric:        Behavior: Behavior normal.        Thought Content: Thought content normal.        Judgment: Judgment normal.      Lab Results  Component Value Date   WBC 5.7 12/24/2022   HGB 9.4 (L) 12/24/2022   HCT 29.6 (L) 12/24/2022   MCV 78.7 (L) 12/24/2022   PLT 183 12/24/2022   Lab Results  Component Value Date   FERRITIN 1,196 (H) 11/26/2022   IRON 72 11/26/2022   TIBC 228 (L) 11/26/2022   UIBC 156 11/26/2022   IRONPCTSAT 32 11/26/2022   Lab Results  Component Value Date   RETICCTPCT 1.4 11/12/2022   RBC 3.76 (L)  12/24/2022   RETICCTABS 27.5 03/21/2014   Lab Results  Component Value Date   KPAFRELGTCHN 73.8 (H) 11/26/2022   LAMBDASER 27.8 (H) 11/26/2022   KAPLAMBRATIO 2.65 (H) 11/26/2022   Lab Results  Component Value Date   IGGSERUM 946 11/26/2022   IGA 162 11/26/2022   IGMSERUM 20 11/26/2022   Lab Results   Component Value Date   TOTALPROTELP 6.0 11/12/2022   ALBUMINELP 3.3 11/12/2022   A1GS 0.2 11/12/2022   A2GS 0.8 11/12/2022   BETS 0.8 11/12/2022   BETA2SER 0.5 11/07/2014   GAMS 0.8 11/12/2022   MSPIKE Not Observed 11/12/2022   SPEI Comment 01/29/2022     Chemistry      Component Value Date/Time   NA 142 12/10/2022 0820   NA 143 02/22/2017 0744   K 3.7 12/10/2022 0820   K 4.0 02/22/2017 0744   CL 104 12/10/2022 0820   CL 106 02/22/2017 0744   CO2 28 12/10/2022 0820   CO2 27 02/22/2017 0744   BUN 32 (H) 12/10/2022 0820   BUN 26 (H) 02/22/2017 0744   CREATININE 3.52 (H) 12/10/2022 0820   CREATININE 2.3 (H) 02/22/2017 0744      Component Value Date/Time   CALCIUM 8.3 (L) 12/10/2022 0820   CALCIUM 9.3 02/22/2017 0744   ALKPHOS 74 12/10/2022 0820   ALKPHOS 80 02/22/2017 0744   AST 28 12/10/2022 0820   ALT 18 12/10/2022 0820   ALT 22 02/22/2017 0744   BILITOT 0.4 12/10/2022 0820       Impression and Plan: [name] is a very pleasant 72 yo African American gentleman with IgG kappa myeloma.  We have been doing this for many years.  So far, he is done incredibly well.  He is on a very good treatment protocol that he is tolerating well.  Again, we will going to hold his treatment at this point.  I want to see if he can gain weight being off treatment.  I think this is can be helpful for his quality of life.  I realize that he does have other health issues that could be affecting his weight.  We will get him back in 1 month.  He gets his testosterone.  He gets his Aredia in the Retacrit.  Again we want to make sure his quality of life is the key.       Josph Macho, MD 10/31/20248:52 AM

## 2022-12-24 NOTE — Progress Notes (Signed)
No treatment today per order of Dr. [name].

## 2022-12-24 NOTE — Patient Instructions (Signed)

## 2022-12-25 LAB — KAPPA/LAMBDA LIGHT CHAINS
Kappa free light chain: 56.3 mg/L — ABNORMAL HIGH (ref 3.3–19.4)
Kappa, lambda light chain ratio: 1.93 — ABNORMAL HIGH (ref 0.26–1.65)
Lambda free light chains: 29.1 mg/L — ABNORMAL HIGH (ref 5.7–26.3)

## 2022-12-25 LAB — TESTOSTERONE: Testosterone: 220 ng/dL — ABNORMAL LOW (ref 264–916)

## 2022-12-26 LAB — IGG, IGA, IGM
IgA: 165 mg/dL (ref 61–437)
IgG (Immunoglobin G), Serum: 1016 mg/dL (ref 603–1613)
IgM (Immunoglobulin M), Srm: 22 mg/dL (ref 15–143)

## 2022-12-27 LAB — PROTEIN ELECTROPHORESIS, SERUM, WITH REFLEX
A/G Ratio: 1.3 (ref 0.7–1.7)
Albumin ELP: 3.5 g/dL (ref 2.9–4.4)
Alpha-1-Globulin: 0.2 g/dL (ref 0.0–0.4)
Alpha-2-Globulin: 0.8 g/dL (ref 0.4–1.0)
Beta Globulin: 0.8 g/dL (ref 0.7–1.3)
Gamma Globulin: 0.9 g/dL (ref 0.4–1.8)
Globulin, Total: 2.8 g/dL (ref 2.2–3.9)
Total Protein ELP: 6.3 g/dL (ref 6.0–8.5)

## 2023-01-01 ENCOUNTER — Ambulatory Visit (INDEPENDENT_AMBULATORY_CARE_PROVIDER_SITE_OTHER): Payer: 59 | Admitting: Podiatry

## 2023-01-01 ENCOUNTER — Encounter: Payer: Self-pay | Admitting: Podiatry

## 2023-01-01 VITALS — Ht 71.0 in | Wt 164.0 lb

## 2023-01-01 DIAGNOSIS — L84 Corns and callosities: Secondary | ICD-10-CM

## 2023-01-01 DIAGNOSIS — E1151 Type 2 diabetes mellitus with diabetic peripheral angiopathy without gangrene: Secondary | ICD-10-CM

## 2023-01-01 DIAGNOSIS — M2011 Hallux valgus (acquired), right foot: Secondary | ICD-10-CM | POA: Diagnosis not present

## 2023-01-01 DIAGNOSIS — M2010 Hallux valgus (acquired), unspecified foot: Secondary | ICD-10-CM

## 2023-01-01 NOTE — Progress Notes (Signed)

## 2023-01-01 NOTE — Progress Notes (Signed)
This patient returns to my office for at risk foot care.  This patient requires this care by a professional since this patient will be at risk due to having diabetes type 2.  This patient is unable to cut nails himself since the patient cannot reach his nails.These nails are painful walking and wearing shoes.  Painful callus under outside ball of both feet. As well as painful corn between his 4/5 toes right foot.  This patient presents for at risk foot care today.  General Appearance  Alert, conversant and in no acute stress.  Vascular  Dorsalis pedis and posterior tibial  pulses are weakly  palpable  bilaterally.  Capillary return is within normal limits  bilaterally. Cold feet bilaterally.  Absent digital hair.  Neurologic  Senn-Weinstein monofilament wire test within normal limits/diminished right.  LOPS absent left foot. Muscle power within normal limits bilaterally.  Nails Thick disfigured discolored nails with subungual debris  from hallux to fifth toes bilaterally. No evidence of bacterial infection or drainage bilaterally.  Orthopedic  No limitations of motion  feet .  No crepitus or effusions noted.  No bony pathology or digital deformities noted.  HAV  B/L.  Exostosis right foot.Plantar flexed fifth metatarsall B/l.  Skin  normotropic skin with no porokeratosis noted bilaterally.  No signs of infections or ulcers noted.  Callus sub 5th met  B/l asymptomatic.  HM 4th interspace right foot. No infection.  Onychomycosis  Pain in right toes  Pain in left toes    Consent was obtained for treatment procedures.   Mechanical debridement of nails 1-5  bilaterally performed with a nail nipper.  Filed with dremel without incident. Debride callus sub 5th  B/L  and HM with # 15 blade and dremel tool.     Return office visit    10 weeks                  Told patient to return for periodic foot care and evaluation due to potential at risk complications.   Helane Gunther DPM

## 2023-01-08 ENCOUNTER — Inpatient Hospital Stay (HOSPITAL_BASED_OUTPATIENT_CLINIC_OR_DEPARTMENT_OTHER): Payer: 59 | Admitting: Hematology & Oncology

## 2023-01-08 ENCOUNTER — Ambulatory Visit (HOSPITAL_BASED_OUTPATIENT_CLINIC_OR_DEPARTMENT_OTHER)
Admission: RE | Admit: 2023-01-08 | Discharge: 2023-01-08 | Disposition: A | Discharge status: Home / Self Care | Payer: 59 | Source: Ambulatory Visit | Attending: Hematology & Oncology | Admitting: Hematology & Oncology

## 2023-01-08 ENCOUNTER — Inpatient Hospital Stay: Payer: 59

## 2023-01-08 ENCOUNTER — Inpatient Hospital Stay: Payer: 59 | Attending: Hematology & Oncology

## 2023-01-08 ENCOUNTER — Encounter: Payer: Self-pay | Admitting: Hematology & Oncology

## 2023-01-08 VITALS — BP 149/67 | HR 88 | Temp 98.1°F | Resp 20 | Ht 71.0 in | Wt 171.0 lb

## 2023-01-08 DIAGNOSIS — M25562 Pain in left knee: Secondary | ICD-10-CM | POA: Insufficient documentation

## 2023-01-08 DIAGNOSIS — M069 Rheumatoid arthritis, unspecified: Secondary | ICD-10-CM | POA: Insufficient documentation

## 2023-01-08 DIAGNOSIS — E119 Type 2 diabetes mellitus without complications: Secondary | ICD-10-CM | POA: Diagnosis not present

## 2023-01-08 DIAGNOSIS — E291 Testicular hypofunction: Secondary | ICD-10-CM | POA: Insufficient documentation

## 2023-01-08 DIAGNOSIS — C9000 Multiple myeloma not having achieved remission: Secondary | ICD-10-CM | POA: Diagnosis present

## 2023-01-08 DIAGNOSIS — J3489 Other specified disorders of nose and nasal sinuses: Secondary | ICD-10-CM | POA: Diagnosis not present

## 2023-01-08 DIAGNOSIS — D509 Iron deficiency anemia, unspecified: Secondary | ICD-10-CM | POA: Diagnosis not present

## 2023-01-08 LAB — CBC WITH DIFFERENTIAL (CANCER CENTER ONLY)
Abs Immature Granulocytes: 0.05 10*3/uL (ref 0.00–0.07)
Basophils Absolute: 0 10*3/uL (ref 0.0–0.1)
Basophils Relative: 1 %
Eosinophils Absolute: 0.1 10*3/uL (ref 0.0–0.5)
Eosinophils Relative: 3 %
HCT: 30.1 % — ABNORMAL LOW (ref 39.0–52.0)
Hemoglobin: 9.5 g/dL — ABNORMAL LOW (ref 13.0–17.0)
Immature Granulocytes: 1 %
Lymphocytes Relative: 13 %
Lymphs Abs: 0.7 10*3/uL (ref 0.7–4.0)
MCH: 24.9 pg — ABNORMAL LOW (ref 26.0–34.0)
MCHC: 31.6 g/dL (ref 30.0–36.0)
MCV: 79 fL — ABNORMAL LOW (ref 80.0–100.0)
Monocytes Absolute: 0.7 10*3/uL (ref 0.1–1.0)
Monocytes Relative: 13 %
Neutro Abs: 3.6 10*3/uL (ref 1.7–7.7)
Neutrophils Relative %: 69 %
Platelet Count: 159 10*3/uL (ref 150–400)
RBC: 3.81 MIL/uL — ABNORMAL LOW (ref 4.22–5.81)
RDW: 18.8 % — ABNORMAL HIGH (ref 11.5–15.5)
WBC Count: 5.2 10*3/uL (ref 4.0–10.5)
nRBC: 0 % (ref 0.0–0.2)

## 2023-01-08 LAB — FERRITIN: Ferritin: 1034 ng/mL — ABNORMAL HIGH (ref 24–336)

## 2023-01-08 LAB — IRON AND IRON BINDING CAPACITY (CC-WL,HP ONLY)
Iron: 49 ug/dL (ref 45–182)
Saturation Ratios: 21 % (ref 17.9–39.5)
TIBC: 235 ug/dL — ABNORMAL LOW (ref 250–450)
UIBC: 186 ug/dL (ref 117–376)

## 2023-01-08 LAB — CMP (CANCER CENTER ONLY)
ALT: 12 U/L (ref 0–44)
AST: 20 U/L (ref 15–41)
Albumin: 4.1 g/dL (ref 3.5–5.0)
Alkaline Phosphatase: 87 U/L (ref 38–126)
Anion gap: 8 (ref 5–15)
BUN: 39 mg/dL — ABNORMAL HIGH (ref 8–23)
CO2: 30 mmol/L (ref 22–32)
Calcium: 8.4 mg/dL — ABNORMAL LOW (ref 8.9–10.3)
Chloride: 103 mmol/L (ref 98–111)
Creatinine: 2.93 mg/dL — ABNORMAL HIGH (ref 0.61–1.24)
GFR, Estimated: 22 mL/min — ABNORMAL LOW (ref >60)
Glucose, Bld: 223 mg/dL — ABNORMAL HIGH (ref 70–99)
Potassium: 3.9 mmol/L (ref 3.5–5.1)
Sodium: 141 mmol/L (ref 135–145)
Total Bilirubin: 0.3 mg/dL (ref <1.2)
Total Protein: 6.8 g/dL (ref 6.5–8.1)

## 2023-01-08 LAB — LACTATE DEHYDROGENASE: LDH: 238 U/L — ABNORMAL HIGH (ref 98–192)

## 2023-01-08 MED ORDER — KETOROLAC TROMETHAMINE 15 MG/ML IJ SOLN
15.0000 mg | Freq: Once | INTRAMUSCULAR | Status: AC
  Administered 2023-01-08: 15 mg via INTRAVENOUS
  Filled 2023-01-08: qty 1

## 2023-01-08 MED ORDER — SODIUM CHLORIDE 0.9 % IV SOLN: INTRAVENOUS | Status: DC

## 2023-01-08 NOTE — Progress Notes (Signed)
Hematology and Oncology Follow Up Visit  [name] [id number] 08/12/1950 72 y.o. 01/08/2023   Principle Diagnosis:  IgG kappa myeloma Anemia secondary to renal insufficiency Intermittent iron - deficiency anemia Hypotestosteronemia   Current Therapy:        Aredia 60 mg IV q  month  -- on hold starting 03/05/2022 -- needs tooth extrated --tooth extracted 03/2022 -started on 07/2022 Retacrit SQ as needed for hemoglobin less than 10 DepoTestosterone 400 mg q 4 weeks IV iron as indicated Kyprolis/Cytoxan - s/p cycle #40 --DC on 12/24/2022   Interim History:  [name] is here today for follow-up and treatment.  However, I really think that we should stop his treatment.  He has done well with the treatment.  I just worried that the weight loss might be secondary to him having all of this chemotherapy.  Given that the fact that his myeloma studies have not shown a monoclonal spike in his blood, I really think we have the flexibility of give him a "vacation" from treatment.  We still have to make sure he gets his testosterone and the Aredia.  This really does help his bony pain from his arthritis.  He also is on Retacrit.  Everything is doing well with these.  Again is very happy that we can stop his treatments right now.  Will try to keep him off treatment as long as possible to see if his weight will improve.  He has had no obvious change in bowel or bladder habits.  He does have diabetes.  He is trying to watch his diabetes.  He has had no fever.  He has had no bleeding.  He has had no nausea or vomiting.  His last testosterone level back in early October was 270.  When we last checked his myeloma studies in September, there was no monoclonal spike in his blood.  His IgG level was 946 mg/dL.  The Kappa light chain was 7.4 mg/dL.  This is up a little bit.  Again we will have to watch this closely.  Currently, I would say his performance status is probably ECOG 1.      Medications:  Allergies as of 01/08/2023       Reactions   Iodinated Contrast Media Rash   Patient states he was instructed not to take IV contrast.  In 2008 he had an unknown reaction, and was told not to take it again.  He was also told not to take it due to his kidneys.   Ioversol    Prednisone    Iodine Anxiety, Rash, Other (See Comments)   Didn't feel right "instructed not to take per MD--something with his port"        Medication List        Accurate as of January 08, 2023  3:38 PM. If you have any questions, ask your nurse or doctor.          amLODipine 10 MG tablet Commonly known as: NORVASC Take 10 mg by mouth daily.   ammonium lactate 12 % lotion Commonly known as: AmLactin Apply 1 application topically as needed for dry skin.   atorvastatin 40 MG tablet Commonly known as: LIPITOR Take 40 mg by mouth daily at 6 PM.   budesonide-formoterol 160-4.5 MCG/ACT inhaler Commonly known as: SYMBICORT SMARTSIG:2 Puff(s) By Mouth Twice Daily   Carafate 1 GM/10ML suspension Generic drug: sucralfate Take 1 g by mouth 4 (four) times daily.   carvedilol 25 MG tablet Commonly known as: COREG  Take 25 mg by mouth 2 (two) times daily.   chlorpheniramine-HYDROcodone 10-8 MG/5ML Commonly known as: TUSSIONEX SMARTSIG:5 Milliliter(s) By Mouth Every 12 Hours   [employer] Fiber Gummies 2 g Chew Chew by mouth daily.   cyclobenzaprine 10 MG tablet Commonly known as: FLEXERIL Take 10 mg by mouth 3 (three) times daily as needed.   diazepam 5 MG tablet Commonly known as: VALIUM Take by mouth at bedtime as needed.   diclofenac Sodium 1 % Gel Commonly known as: VOLTAREN Apply topically.   dicyclomine 10 MG capsule Commonly known as: BENTYL TAKE 1 CAPSULE BY MOUTH 3 TIMES A DAY BEFORE MEALS   dronabinol 5 MG capsule Commonly known as: MARINOL Take 1 capsule (5 mg total) by mouth 2 (two) times daily before a meal.   gabapentin 300 MG capsule Commonly known as:  NEURONTIN TAKE 1 CAPSULE BY MOUTH THREE TIMES A DAY What changed:  how much to take how to take this when to take this additional instructions   glimepiride 2 MG tablet Commonly known as: AMARYL Take 2 mg by mouth daily.   hydrocortisone 2.5 % cream Apply topically 2 (two) times daily.   Januvia 100 MG tablet Generic drug: sitaGLIPtin Take 100 mg by mouth daily.   Klor-Con M20 20 MEQ tablet Generic drug: potassium chloride SA Take 20 mEq by mouth daily.   lactulose 10 GM/15ML solution Commonly known as: CHRONULAC Take 30 mLs (20 g total) by mouth daily.   latanoprost 0.005 % ophthalmic solution Commonly known as: XALATAN Place 1 drop into both eyes at bedtime.   linaclotide 145 MCG Caps capsule Commonly known as: LINZESS Take by mouth daily as needed.   losartan 50 MG tablet Commonly known as: COZAAR Take 50 mg by mouth daily.   Lumigan 0.01 % Soln Generic drug: bimatoprost Place 1 drop into both eyes 3 (three) times daily.   mesalamine 1.2 g EC tablet Commonly known as: LIALDA Take by mouth daily with breakfast.   methocarbamol 500 MG tablet Commonly known as: ROBAXIN Take 1 tablet (500 mg total) by mouth 2 (two) times daily.   metoprolol succinate 25 MG 24 hr tablet Commonly known as: TOPROL-XL Take 25 mg by mouth daily.   mirabegron ER 25 MG Tb24 tablet Commonly known as: MYRBETRIQ Take 20 mg by mouth daily.   morphine 30 MG 12 hr tablet Commonly known as: MS CONTIN Take 30 mg by mouth 2 (two) times daily.   omeprazole 40 MG capsule Commonly known as: PRILOSEC TAKE 1 CAPSULE BY MOUTH TWICE A DAY   oxyCODONE 5 MG immediate release tablet Commonly known as: Roxicodone 1 tab PO q6 hours prn pain   oxyCODONE-acetaminophen 10-325 MG tablet Commonly known as: PERCOCET Take 1 tablet by mouth every 6 (six) hours as needed for pain.   pioglitazone 30 MG tablet Commonly known as: ACTOS Take 30 mg by mouth daily.   polyethylene glycol powder 17  GM/SCOOP powder Commonly known as: GLYCOLAX/MIRALAX SMARTSIG:1 scoopful By Mouth Twice Daily PRN   prochlorperazine 10 MG tablet Commonly known as: COMPAZINE take 1 tablet by mouth every 6 hours if needed for nausea and vomiting   Restasis 0.05 % ophthalmic emulsion Generic drug: cycloSPORINE Place 1 drop into both eyes daily.   sildenafil 100 MG tablet Commonly known as: VIAGRA   SUMAtriptan 100 MG tablet Commonly known as: IMITREX Take 100 mg by mouth daily.   topiramate 25 MG capsule Commonly known as: TOPAMAX Take 25 mg by mouth 2 (two)  times daily.   topiramate 50 MG tablet Commonly known as: TOPAMAX Take 50 mg by mouth at bedtime.   torsemide 20 MG tablet Commonly known as: DEMADEX Take 20 mg by mouth daily.   Vitamin D (Ergocalciferol) 1.25 MG (50000 UNIT) Caps capsule Commonly known as: DRISDOL Take 50,000 Units by mouth every Saturday.   Xtampza ER 13.5 MG C12a Generic drug: oxyCODONE ER daily.        Allergies:  Allergies  Allergen Reactions   Iodinated Contrast Media Rash    Patient states he was instructed not to take IV contrast.  In 2008 he had an unknown reaction, and was told not to take it again.  He was also told not to take it due to his kidneys.   Ioversol    Prednisone    Iodine Anxiety, Rash and Other (See Comments)    Didn't feel right "instructed not to take per MD--something with his port"     Past Medical History, Surgical history, Social history, and Family History were reviewed and updated.  Review of Systems: Review of Systems  Constitutional:  Positive for malaise/fatigue.  HENT: Negative.    Eyes: Negative.   Respiratory: Negative.    Cardiovascular: Negative.   Gastrointestinal:  Positive for nausea.  Genitourinary: Negative.   Musculoskeletal:  Positive for joint pain and myalgias.  Skin: Negative.   Neurological: Negative.   Endo/Heme/Allergies: Negative.   Psychiatric/Behavioral: Negative.       Physical  Exam: Vital signs are temperature of 98.1.  Pulse is 88.  Blood pressure 149/67.  Weight is 171 pounds.   Wt Readings from Last 3 Encounters:  01/08/23 171 lb 0.6 oz (77.6 kg)  01/01/23 164 lb (74.4 kg)  12/24/22 163 lb 12.8 oz (74.3 kg)   Physical Exam Vitals reviewed.  HENT:     Head: Normocephalic and atraumatic.  Eyes:     Pupils: Pupils are equal, round, and reactive to light.  Cardiovascular:     Rate and Rhythm: Normal rate and regular rhythm.     Heart sounds: Normal heart sounds.  Pulmonary:     Effort: Pulmonary effort is normal.     Breath sounds: Normal breath sounds.  Abdominal:     General: Bowel sounds are normal.     Palpations: Abdomen is soft.  Musculoskeletal:        General: No tenderness or deformity. Normal range of motion.     Cervical back: Normal range of motion.  Lymphadenopathy:     Cervical: No cervical adenopathy.  Skin:    General: Skin is warm and dry.     Findings: No erythema or rash.  Neurological:     Mental Status: He is alert and oriented to person, place, and time.  Psychiatric:        Behavior: Behavior normal.        Thought Content: Thought content normal.        Judgment: Judgment normal.       Lab Results  Component Value Date   WBC 5.2 01/08/2023   HGB 9.5 (L) 01/08/2023   HCT 30.1 (L) 01/08/2023   MCV 79.0 (L) 01/08/2023   PLT 159 01/08/2023   Lab Results  Component Value Date   FERRITIN 1,034 (H) 01/08/2023   IRON 49 01/08/2023   TIBC 235 (L) 01/08/2023   UIBC 186 01/08/2023   IRONPCTSAT 21 01/08/2023   Lab Results  Component Value Date   RETICCTPCT 1.4 11/12/2022   RBC 3.81 (L) 01/08/2023  RETICCTABS 27.5 03/21/2014   Lab Results  Component Value Date   KPAFRELGTCHN 56.3 (H) 12/24/2022   LAMBDASER 29.1 (H) 12/24/2022   KAPLAMBRATIO 1.93 (H) 12/24/2022   Lab Results  Component Value Date   IGGSERUM 1,016 12/24/2022   IGA 165 12/24/2022   IGMSERUM 22 12/24/2022   Lab Results  Component Value Date    TOTALPROTELP 6.3 12/24/2022   ALBUMINELP 3.5 12/24/2022   A1GS 0.2 12/24/2022   A2GS 0.8 12/24/2022   BETS 0.8 12/24/2022   BETA2SER 0.5 11/07/2014   GAMS 0.9 12/24/2022   MSPIKE Not Observed 12/24/2022   SPEI Comment 01/29/2022     Chemistry      Component Value Date/Time   NA 141 01/08/2023 0809   NA 143 02/22/2017 0744   K 3.9 01/08/2023 0809   K 4.0 02/22/2017 0744   CL 103 01/08/2023 0809   CL 106 02/22/2017 0744   CO2 30 01/08/2023 0809   CO2 27 02/22/2017 0744   BUN 39 (H) 01/08/2023 0809   BUN 26 (H) 02/22/2017 0744   CREATININE 2.93 (H) 01/08/2023 0809   CREATININE 2.3 (H) 02/22/2017 0744      Component Value Date/Time   CALCIUM 8.4 (L) 01/08/2023 0809   CALCIUM 9.3 02/22/2017 0744   ALKPHOS 87 01/08/2023 0809   ALKPHOS 80 02/22/2017 0744   AST 20 01/08/2023 0809   ALT 12 01/08/2023 0809   ALT 22 02/22/2017 0744   BILITOT 0.3 01/08/2023 0809       Impression and Plan: Mr. Asada is a very pleasant 72 yo African American gentleman with IgG kappa myeloma.  We have been doing this for many years.  So far, he is done incredibly well.  He is on a very good treatment protocol that he is tolerating well.  Again, we will going to hold his treatment at this point.  I want to see if he can gain weight being off treatment.  I think this is can be helpful for his quality of life.  I realize that he does have other health issues that could be affecting his weight.  We will get him back in 1 month.  He gets his testosterone.  He gets his Aredia in the Retacrit.  Again we want to make sure his quality of life is the key.  Josph Macho, MD 11/15/20243:38 PM   ADDENDUM: Mr. Norlander comes in for a early visit.  He says he does not feel well.  He has had temperatures.  He was afebrile today.  He has been having some sweats.  He has been have a lot of pain in the left knee.  We did do an x-ray of the left knee.  There is no swelling or erythema with the left knee.  We  did do the x-ray.  Result is pending.  His labs look okay.  I am not sure exactly what might be going on with him.  I do not know if this might be a flareup of the rheumatoid arthritis.  We we will give him some IV fluid.  We will give him some Toradol.  I think this continues, he can certainly see his family doctor.   Christin Bach, MD

## 2023-01-10 LAB — IGG, IGA, IGM
IgA: 173 mg/dL (ref 61–437)
IgG (Immunoglobin G), Serum: 1021 mg/dL (ref 603–1613)
IgM (Immunoglobulin M), Srm: 21 mg/dL (ref 15–143)

## 2023-01-10 LAB — TESTOSTERONE: Testosterone: 882 ng/dL (ref 264–916)

## 2023-01-11 LAB — KAPPA/LAMBDA LIGHT CHAINS
Kappa free light chain: 55.1 mg/L — ABNORMAL HIGH (ref 3.3–19.4)
Kappa, lambda light chain ratio: 1.84 — ABNORMAL HIGH (ref 0.26–1.65)
Lambda free light chains: 29.9 mg/L — ABNORMAL HIGH (ref 5.7–26.3)

## 2023-01-11 LAB — PROTEIN ELECTROPHORESIS, SERUM, WITH REFLEX
A/G Ratio: 1.3 (ref 0.7–1.7)
Albumin ELP: 3.7 g/dL (ref 2.9–4.4)
Alpha-1-Globulin: 0.2 g/dL (ref 0.0–0.4)
Alpha-2-Globulin: 0.8 g/dL (ref 0.4–1.0)
Beta Globulin: 0.8 g/dL (ref 0.7–1.3)
Gamma Globulin: 0.9 g/dL (ref 0.4–1.8)
Globulin, Total: 2.8 g/dL (ref 2.2–3.9)
Total Protein ELP: 6.5 g/dL (ref 6.0–8.5)

## 2023-01-20 ENCOUNTER — Inpatient Hospital Stay (HOSPITAL_BASED_OUTPATIENT_CLINIC_OR_DEPARTMENT_OTHER): Payer: 59 | Admitting: Hematology & Oncology

## 2023-01-20 ENCOUNTER — Ambulatory Visit (HOSPITAL_BASED_OUTPATIENT_CLINIC_OR_DEPARTMENT_OTHER)
Admission: RE | Admit: 2023-01-20 | Discharge: 2023-01-20 | Disposition: A | Discharge status: Home / Self Care | Payer: 59 | Source: Ambulatory Visit | Attending: Hematology & Oncology | Admitting: Hematology & Oncology

## 2023-01-20 ENCOUNTER — Other Ambulatory Visit: Payer: Self-pay

## 2023-01-20 ENCOUNTER — Inpatient Hospital Stay: Payer: 59

## 2023-01-20 ENCOUNTER — Encounter: Payer: Self-pay | Admitting: Hematology & Oncology

## 2023-01-20 VITALS — HR 81

## 2023-01-20 VITALS — BP 165/69 | HR 81 | Temp 98.0°F | Resp 20 | Ht 71.0 in | Wt 165.0 lb

## 2023-01-20 DIAGNOSIS — C9000 Multiple myeloma not having achieved remission: Secondary | ICD-10-CM | POA: Diagnosis not present

## 2023-01-20 DIAGNOSIS — E349 Endocrine disorder, unspecified: Secondary | ICD-10-CM

## 2023-01-20 DIAGNOSIS — D631 Anemia in chronic kidney disease: Secondary | ICD-10-CM

## 2023-01-20 DIAGNOSIS — Z299 Encounter for prophylactic measures, unspecified: Secondary | ICD-10-CM

## 2023-01-20 DIAGNOSIS — M4712 Other spondylosis with myelopathy, cervical region: Secondary | ICD-10-CM

## 2023-01-20 DIAGNOSIS — J3489 Other specified disorders of nose and nasal sinuses: Secondary | ICD-10-CM | POA: Insufficient documentation

## 2023-01-20 LAB — CBC WITH DIFFERENTIAL (CANCER CENTER ONLY)
Abs Immature Granulocytes: 0.05 K/uL (ref 0.00–0.07)
Basophils Absolute: 0.1 K/uL (ref 0.0–0.1)
Basophils Relative: 1 %
Eosinophils Absolute: 0.1 K/uL (ref 0.0–0.5)
Eosinophils Relative: 2 %
HCT: 31.4 % — ABNORMAL LOW (ref 39.0–52.0)
Hemoglobin: 10.1 g/dL — ABNORMAL LOW (ref 13.0–17.0)
Immature Granulocytes: 1 %
Lymphocytes Relative: 13 %
Lymphs Abs: 1 K/uL (ref 0.7–4.0)
MCH: 24.8 pg — ABNORMAL LOW (ref 26.0–34.0)
MCHC: 32.2 g/dL (ref 30.0–36.0)
MCV: 77.1 fL — ABNORMAL LOW (ref 80.0–100.0)
Monocytes Absolute: 1 K/uL (ref 0.1–1.0)
Monocytes Relative: 13 %
Neutro Abs: 5.8 K/uL (ref 1.7–7.7)
Neutrophils Relative %: 70 %
Platelet Count: 179 K/uL (ref 150–400)
RBC: 4.07 MIL/uL — ABNORMAL LOW (ref 4.22–5.81)
RDW: 18.2 % — ABNORMAL HIGH (ref 11.5–15.5)
WBC Count: 8.1 K/uL (ref 4.0–10.5)
nRBC: 0 % (ref 0.0–0.2)

## 2023-01-20 LAB — CMP (CANCER CENTER ONLY)
ALT: 17 U/L (ref 0–44)
AST: 20 U/L (ref 15–41)
Albumin: 4 g/dL (ref 3.5–5.0)
Alkaline Phosphatase: 110 U/L (ref 38–126)
Anion gap: 7 (ref 5–15)
BUN: 31 mg/dL — ABNORMAL HIGH (ref 8–23)
CO2: 28 mmol/L (ref 22–32)
Calcium: 9.1 mg/dL (ref 8.9–10.3)
Chloride: 101 mmol/L (ref 98–111)
Creatinine: 2.65 mg/dL — ABNORMAL HIGH (ref 0.61–1.24)
GFR, Estimated: 25 mL/min — ABNORMAL LOW (ref >60)
Glucose, Bld: 164 mg/dL — ABNORMAL HIGH (ref 70–99)
Potassium: 4 mmol/L (ref 3.5–5.1)
Sodium: 136 mmol/L (ref 135–145)
Total Bilirubin: 0.3 mg/dL (ref <1.2)
Total Protein: 7.2 g/dL (ref 6.5–8.1)

## 2023-01-20 LAB — IRON AND IRON BINDING CAPACITY (CC-WL,HP ONLY)
Iron: 50 ug/dL (ref 45–182)
Saturation Ratios: 22 % (ref 17.9–39.5)
TIBC: 231 ug/dL — ABNORMAL LOW (ref 250–450)
UIBC: 181 ug/dL (ref 117–376)

## 2023-01-20 LAB — TSH: TSH: 1.3 u[IU]/mL (ref 0.350–4.500)

## 2023-01-20 LAB — VITAMIN B12: Vitamin B-12: 1263 pg/mL — ABNORMAL HIGH (ref 180–914)

## 2023-01-20 MED ORDER — KETOROLAC TROMETHAMINE 15 MG/ML IJ SOLN
15.0000 mg | Freq: Once | INTRAMUSCULAR | Status: AC
  Administered 2023-01-20: 15 mg via INTRAVENOUS
  Filled 2023-01-20: qty 1

## 2023-01-20 MED ORDER — CYANOCOBALAMIN 1000 MCG/ML IJ SOLN
1000.0000 ug | Freq: Once | INTRAMUSCULAR | Status: AC
  Administered 2023-01-20: 1000 ug via INTRAMUSCULAR
  Filled 2023-01-20: qty 1

## 2023-01-20 MED ORDER — HYDROMORPHONE HCL 1 MG/ML IJ SOLN
2.0000 mg | Freq: Once | INTRAMUSCULAR | Status: AC
  Administered 2023-01-20: 2 mg via INTRAVENOUS
  Filled 2023-01-20: qty 2

## 2023-01-20 MED ORDER — TESTOSTERONE CYPIONATE 200 MG/ML IM SOLN
400.0000 mg | Freq: Once | INTRAMUSCULAR | Status: AC
  Administered 2023-01-20: 400 mg via INTRAMUSCULAR
  Filled 2023-01-20: qty 2

## 2023-01-20 MED ORDER — SODIUM CHLORIDE 0.9 % IV SOLN
60.0000 mg | Freq: Once | INTRAVENOUS | Status: DC
  Filled 2023-01-20: qty 20

## 2023-01-20 MED ORDER — SODIUM CHLORIDE 0.9 % IV SOLN
60.0000 mg | Freq: Once | INTRAVENOUS | Status: AC
  Administered 2023-01-20: 60 mg via INTRAVENOUS
  Filled 2023-01-20: qty 10

## 2023-01-20 NOTE — Progress Notes (Signed)
Hematology and Oncology Follow Up Visit  [name] [id number] Sep 24, 1950 72 y.o. 01/20/2023   Principle Diagnosis:  IgG kappa myeloma Anemia secondary to renal insufficiency Intermittent iron - deficiency anemia Hypotestosteronemia   Current Therapy:        Aredia 60 mg IV q  month  -- on hold starting 03/05/2022 -- needs tooth extrated --tooth extracted 03/2022 -started on 07/2022 Retacrit SQ as needed for hemoglobin less than 10 DepoTestosterone 400 mg q 4 weeks IV iron as indicated Kyprolis/Cytoxan - s/p cycle #40 --DC on 12/24/2022   Interim History:  [name] is here today for follow-up.  We now have him off treatment.  We just watch him for right now.  He may feel a little bit better.  He has a little swelling in the right face.  Measures what might be going on.  We did get a facial x-ray looking the sinuses.  The sinuses all looked okay.  He still is bothered by his arthritis.  He has significant arthritis.  He does have diabetes.  I think his blood sugars have been doing fairly well.  We last checked his myeloma studies, there is no monoclonal spike in his blood.  His IgG level was 1020 mg/dL.  The Kappa light chain was 5.5 mg/dL.  We are giving him testosterone.  This does make him feel better.  His last level was 882.  His iron studies have been doing fairly well.  When we checked his iron levels today, his saturation was 22%.  He has had no fever.  There has been no obvious bleeding.  Overall, I would say his performance status is probably ECOG 1.    Medications:  Allergies as of 01/20/2023       Reactions   Iodinated Contrast Media Rash   Patient states he was instructed not to take IV contrast.  In 2008 he had an unknown reaction, and was told not to take it again.  He was also told not to take it due to his kidneys.   Ioversol    Prednisone    Iodine Anxiety, Rash, Other (See Comments)   Didn't feel right "instructed not to take per MD--something  with his port"        Medication List        Accurate as of January 20, 2023  8:15 AM. If you have any questions, ask your nurse or doctor.          amLODipine 10 MG tablet Commonly known as: NORVASC Take 10 mg by mouth daily.   ammonium lactate 12 % lotion Commonly known as: AmLactin Apply 1 application topically as needed for dry skin.   atorvastatin 40 MG tablet Commonly known as: LIPITOR Take 40 mg by mouth daily at 6 PM.   budesonide-formoterol 160-4.5 MCG/ACT inhaler Commonly known as: SYMBICORT SMARTSIG:2 Puff(s) By Mouth Twice Daily   Carafate 1 GM/10ML suspension Generic drug: sucralfate Take 1 g by mouth 4 (four) times daily.   carvedilol 25 MG tablet Commonly known as: COREG Take 25 mg by mouth 2 (two) times daily.   chlorpheniramine-HYDROcodone 10-8 MG/5ML Commonly known as: TUSSIONEX SMARTSIG:5 Milliliter(s) By Mouth Every 12 Hours   [employer] Fiber Gummies 2 g Chew Chew by mouth daily.   cyclobenzaprine 10 MG tablet Commonly known as: FLEXERIL Take 10 mg by mouth 3 (three) times daily as needed.   diazepam 5 MG tablet Commonly known as: VALIUM Take by mouth at bedtime as needed.   diclofenac Sodium  1 % Gel Commonly known as: VOLTAREN Apply topically.   dicyclomine 10 MG capsule Commonly known as: BENTYL TAKE 1 CAPSULE BY MOUTH 3 TIMES A DAY BEFORE MEALS   dronabinol 5 MG capsule Commonly known as: MARINOL Take 1 capsule (5 mg total) by mouth 2 (two) times daily before a meal.   gabapentin 300 MG capsule Commonly known as: NEURONTIN TAKE 1 CAPSULE BY MOUTH THREE TIMES A DAY What changed:  how much to take how to take this when to take this additional instructions   glimepiride 2 MG tablet Commonly known as: AMARYL Take 2 mg by mouth daily.   hydrocortisone 2.5 % cream Apply topically 2 (two) times daily.   Januvia 100 MG tablet Generic drug: sitaGLIPtin Take 100 mg by mouth daily.   Klor-Con M20 20 MEQ tablet Generic drug:  potassium chloride SA Take 20 mEq by mouth daily.   lactulose 10 GM/15ML solution Commonly known as: CHRONULAC Take 30 mLs (20 g total) by mouth daily.   latanoprost 0.005 % ophthalmic solution Commonly known as: XALATAN Place 1 drop into both eyes at bedtime.   linaclotide 145 MCG Caps capsule Commonly known as: LINZESS Take by mouth daily as needed.   losartan 50 MG tablet Commonly known as: COZAAR Take 50 mg by mouth daily.   Lumigan 0.01 % Soln Generic drug: bimatoprost Place 1 drop into both eyes 3 (three) times daily.   mesalamine 1.2 g EC tablet Commonly known as: LIALDA Take by mouth daily with breakfast.   methocarbamol 500 MG tablet Commonly known as: ROBAXIN Take 1 tablet (500 mg total) by mouth 2 (two) times daily.   metoprolol succinate 25 MG 24 hr tablet Commonly known as: TOPROL-XL Take 25 mg by mouth daily.   mirabegron ER 25 MG Tb24 tablet Commonly known as: MYRBETRIQ Take 20 mg by mouth daily.   morphine 30 MG 12 hr tablet Commonly known as: MS CONTIN Take 30 mg by mouth 2 (two) times daily.   omeprazole 40 MG capsule Commonly known as: PRILOSEC TAKE 1 CAPSULE BY MOUTH TWICE A DAY   oxyCODONE 5 MG immediate release tablet Commonly known as: Roxicodone 1 tab PO q6 hours prn pain   oxyCODONE-acetaminophen 10-325 MG tablet Commonly known as: PERCOCET Take 1 tablet by mouth every 6 (six) hours as needed for pain.   pioglitazone 30 MG tablet Commonly known as: ACTOS Take 30 mg by mouth daily.   polyethylene glycol powder 17 GM/SCOOP powder Commonly known as: GLYCOLAX/MIRALAX SMARTSIG:1 scoopful By Mouth Twice Daily PRN   prochlorperazine 10 MG tablet Commonly known as: COMPAZINE take 1 tablet by mouth every 6 hours if needed for nausea and vomiting   Restasis 0.05 % ophthalmic emulsion Generic drug: cycloSPORINE Place 1 drop into both eyes daily.   sildenafil 100 MG tablet Commonly known as: VIAGRA   SUMAtriptan 100 MG  tablet Commonly known as: IMITREX Take 100 mg by mouth daily.   topiramate 25 MG capsule Commonly known as: TOPAMAX Take 25 mg by mouth 2 (two) times daily.   topiramate 50 MG tablet Commonly known as: TOPAMAX Take 50 mg by mouth at bedtime.   torsemide 20 MG tablet Commonly known as: DEMADEX Take 20 mg by mouth daily.   Vitamin D (Ergocalciferol) 1.25 MG (50000 UNIT) Caps capsule Commonly known as: DRISDOL Take 50,000 Units by mouth every Saturday.   Xtampza ER 13.5 MG C12a Generic drug: oxyCODONE ER daily.        Allergies:  Allergies  Allergen Reactions   Iodinated Contrast Media Rash    Patient states he was instructed not to take IV contrast.  In 2008 he had an unknown reaction, and was told not to take it again.  He was also told not to take it due to his kidneys.   Ioversol    Prednisone    Iodine Anxiety, Rash and Other (See Comments)    Didn't feel right "instructed not to take per MD--something with his port"     Past Medical History, Surgical history, Social history, and Family History were reviewed and updated.  Review of Systems: Review of Systems  Constitutional:  Positive for malaise/fatigue.  HENT: Negative.    Eyes: Negative.   Respiratory: Negative.    Cardiovascular: Negative.   Gastrointestinal:  Positive for nausea.  Genitourinary: Negative.   Musculoskeletal:  Positive for joint pain and myalgias.  Skin: Negative.   Neurological: Negative.   Endo/Heme/Allergies: Negative.   Psychiatric/Behavioral: Negative.       Physical Exam: Vital signs are temperature of 98.  Pulse 81.  Blood pressure 165/69.  Weight is 165 pounds.    Wt Readings from Last 3 Encounters:  01/08/23 171 lb 0.6 oz (77.6 kg)  01/01/23 164 lb (74.4 kg)  12/24/22 163 lb 12.8 oz (74.3 kg)   Physical Exam Vitals reviewed.  HENT:     Head: Normocephalic and atraumatic.  Eyes:     Pupils: Pupils are equal, round, and reactive to light.  Cardiovascular:     Rate  and Rhythm: Normal rate and regular rhythm.     Heart sounds: Normal heart sounds.  Pulmonary:     Effort: Pulmonary effort is normal.     Breath sounds: Normal breath sounds.  Abdominal:     General: Bowel sounds are normal.     Palpations: Abdomen is soft.  Musculoskeletal:        General: No tenderness or deformity. Normal range of motion.     Cervical back: Normal range of motion.  Lymphadenopathy:     Cervical: No cervical adenopathy.  Skin:    General: Skin is warm and dry.     Findings: No erythema or rash.  Neurological:     Mental Status: He is alert and oriented to person, place, and time.  Psychiatric:        Behavior: Behavior normal.        Thought Content: Thought content normal.        Judgment: Judgment normal.      Lab Results  Component Value Date   WBC 5.2 01/08/2023   HGB 9.5 (L) 01/08/2023   HCT 30.1 (L) 01/08/2023   MCV 79.0 (L) 01/08/2023   PLT 159 01/08/2023   Lab Results  Component Value Date   FERRITIN 1,034 (H) 01/08/2023   IRON 49 01/08/2023   TIBC 235 (L) 01/08/2023   UIBC 186 01/08/2023   IRONPCTSAT 21 01/08/2023   Lab Results  Component Value Date   RETICCTPCT 1.4 11/12/2022   RBC 3.81 (L) 01/08/2023   RETICCTABS 27.5 03/21/2014   Lab Results  Component Value Date   KPAFRELGTCHN 55.1 (H) 01/08/2023   LAMBDASER 29.9 (H) 01/08/2023   KAPLAMBRATIO 1.84 (H) 01/08/2023   Lab Results  Component Value Date   IGGSERUM 1,021 01/08/2023   IGA 173 01/08/2023   IGMSERUM 21 01/08/2023   Lab Results  Component Value Date   TOTALPROTELP 6.5 01/08/2023   ALBUMINELP 3.7 01/08/2023   A1GS 0.2 01/08/2023   A2GS 0.8  01/08/2023   BETS 0.8 01/08/2023   BETA2SER 0.5 11/07/2014   GAMS 0.9 01/08/2023   MSPIKE Not Observed 01/08/2023   SPEI Comment 01/29/2022     Chemistry      Component Value Date/Time   NA 141 01/08/2023 0809   NA 143 02/22/2017 0744   K 3.9 01/08/2023 0809   K 4.0 02/22/2017 0744   CL 103 01/08/2023 0809   CL 106  02/22/2017 0744   CO2 30 01/08/2023 0809   CO2 27 02/22/2017 0744   BUN 39 (H) 01/08/2023 0809   BUN 26 (H) 02/22/2017 0744   CREATININE 2.93 (H) 01/08/2023 0809   CREATININE 2.3 (H) 02/22/2017 0744      Component Value Date/Time   CALCIUM 8.4 (L) 01/08/2023 0809   CALCIUM 9.3 02/22/2017 0744   ALKPHOS 87 01/08/2023 0809   ALKPHOS 80 02/22/2017 0744   AST 20 01/08/2023 0809   ALT 12 01/08/2023 0809   ALT 22 02/22/2017 0744   BILITOT 0.3 01/08/2023 0809       Impression and Plan: [name] is a very pleasant 72 yo African American gentleman with IgG kappa myeloma.  We have been doing this for many years.  So far, he is done incredibly well.  He is on a very good treatment protocol that he is tolerating well.  His treatment is still on hold.  We will continue to hold this as long as we can.  I will plan to see him back in another month or so.  Hopefully, he will start feeling a little bit better.  I know that his arthritis is certainly a real problem for him.  Josph Macho, MD 11/27/20248:15 AM

## 2023-01-20 NOTE — Patient Instructions (Signed)
[facility] - A DEPT OF [facility]  Discharge Instructions: Thank you for choosing [facility] to provide your oncology and hematology care.   If you have a lab appointment with the Cancer Center, please go directly to the Cancer Center and check in at the registration area.  Wear comfortable clothing and clothing appropriate for easy access to any Portacath or PICC line.   We strive to give you quality time with your provider. You may need to reschedule your appointment if you arrive late (15 or more minutes).  Arriving late affects you and other patients whose appointments are after yours.  Also, if you miss three or more appointments without notifying the office, you may be dismissed from the clinic at the provider's discretion.      For prescription refill requests, have your pharmacy contact our office and allow 72 hours for refills to be completed.    Today you received the following chemotherapy and/or immunotherapy agents: Aredia    To help prevent nausea and vomiting after your treatment, we encourage you to take your nausea medication as directed.  BELOW ARE SYMPTOMS THAT SHOULD BE REPORTED IMMEDIATELY: *FEVER GREATER THAN 100.4 F (38 C) OR HIGHER *CHILLS OR SWEATING *NAUSEA AND VOMITING THAT IS NOT CONTROLLED WITH YOUR NAUSEA MEDICATION *UNUSUAL SHORTNESS OF BREATH *UNUSUAL BRUISING OR BLEEDING *URINARY PROBLEMS (pain or burning when urinating, or frequent urination) *BOWEL PROBLEMS (unusual diarrhea, constipation, pain near the anus) TENDERNESS IN MOUTH AND THROAT WITH OR WITHOUT PRESENCE OF ULCERS (sore throat, sores in mouth, or a toothache) UNUSUAL RASH, SWELLING OR PAIN  UNUSUAL VAGINAL DISCHARGE OR ITCHING   Items with * indicate a potential emergency and should be followed up as soon as possible or go to the Emergency Department if any problems should occur.  Please show the CHEMOTHERAPY ALERT CARD or IMMUNOTHERAPY ALERT  CARD at check-in to the Emergency Department and triage nurse. Should you have questions after your visit or need to cancel or reschedule your appointment, please contact [facility] - A DEPT OF [facility]  [phone] and follow the prompts.  Office hours are 8:00 a.m. to 4:30 p.m. Monday - Friday. Please note that voicemails left after 4:00 p.m. may not be returned until the following business day.  We are closed weekends and major holidays. You have access to a nurse at all times for urgent questions. Please call the main number to the clinic [phone] and follow the prompts.  For any non-urgent questions, you may also contact your provider using MyChart. We now offer e-Visits for anyone 34 and older to request care online for non-urgent symptoms. For details visit [email or web address].   Also download the MyChart app! Go to the app store, search "MyChart", open the app, select [place], and log in with your MyChart username and password.

## 2023-01-25 ENCOUNTER — Telehealth: Payer: Self-pay | Admitting: *Deleted

## 2023-01-25 NOTE — Telephone Encounter (Signed)
As noted below by Dr. Myna Hidalgo, I called the patient and left a message stating that the knee x-ray is normal. If you have any questions or concerns, please call the office.

## 2023-01-25 NOTE — Telephone Encounter (Signed)
-----   Message from Josph Macho sent at 01/25/2023  6:58 AM EST ----- Call - the knee x-ray is normal!!!  Cindee Lame

## 2023-02-22 ENCOUNTER — Inpatient Hospital Stay: Payer: 59

## 2023-02-22 ENCOUNTER — Other Ambulatory Visit: Payer: Self-pay

## 2023-02-22 ENCOUNTER — Inpatient Hospital Stay (HOSPITAL_BASED_OUTPATIENT_CLINIC_OR_DEPARTMENT_OTHER): Payer: 59 | Admitting: Hematology & Oncology

## 2023-02-22 ENCOUNTER — Inpatient Hospital Stay: Payer: 59 | Attending: Hematology & Oncology

## 2023-02-22 ENCOUNTER — Encounter: Payer: Self-pay | Admitting: Hematology & Oncology

## 2023-02-22 VITALS — BP 155/66 | HR 68 | Resp 17

## 2023-02-22 VITALS — BP 137/71 | HR 76 | Temp 98.2°F | Resp 18 | Ht 71.0 in | Wt 169.0 lb

## 2023-02-22 DIAGNOSIS — R5383 Other fatigue: Secondary | ICD-10-CM | POA: Insufficient documentation

## 2023-02-22 DIAGNOSIS — N189 Chronic kidney disease, unspecified: Secondary | ICD-10-CM | POA: Insufficient documentation

## 2023-02-22 DIAGNOSIS — E291 Testicular hypofunction: Secondary | ICD-10-CM | POA: Insufficient documentation

## 2023-02-22 DIAGNOSIS — C9000 Multiple myeloma not having achieved remission: Secondary | ICD-10-CM

## 2023-02-22 DIAGNOSIS — Z888 Allergy status to other drugs, medicaments and biological substances status: Secondary | ICD-10-CM | POA: Diagnosis not present

## 2023-02-22 DIAGNOSIS — D631 Anemia in chronic kidney disease: Secondary | ICD-10-CM | POA: Insufficient documentation

## 2023-02-22 DIAGNOSIS — M2550 Pain in unspecified joint: Secondary | ICD-10-CM | POA: Diagnosis not present

## 2023-02-22 DIAGNOSIS — E349 Endocrine disorder, unspecified: Secondary | ICD-10-CM | POA: Diagnosis not present

## 2023-02-22 DIAGNOSIS — M791 Myalgia, unspecified site: Secondary | ICD-10-CM | POA: Insufficient documentation

## 2023-02-22 DIAGNOSIS — N184 Chronic kidney disease, stage 4 (severe): Secondary | ICD-10-CM | POA: Diagnosis not present

## 2023-02-22 DIAGNOSIS — Z91041 Radiographic dye allergy status: Secondary | ICD-10-CM | POA: Insufficient documentation

## 2023-02-22 DIAGNOSIS — D509 Iron deficiency anemia, unspecified: Secondary | ICD-10-CM | POA: Insufficient documentation

## 2023-02-22 DIAGNOSIS — D500 Iron deficiency anemia secondary to blood loss (chronic): Secondary | ICD-10-CM | POA: Diagnosis not present

## 2023-02-22 DIAGNOSIS — M4712 Other spondylosis with myelopathy, cervical region: Secondary | ICD-10-CM

## 2023-02-22 DIAGNOSIS — E611 Iron deficiency: Secondary | ICD-10-CM | POA: Diagnosis not present

## 2023-02-22 DIAGNOSIS — Z299 Encounter for prophylactic measures, unspecified: Secondary | ICD-10-CM

## 2023-02-22 DIAGNOSIS — Z79899 Other long term (current) drug therapy: Secondary | ICD-10-CM | POA: Insufficient documentation

## 2023-02-22 DIAGNOSIS — E119 Type 2 diabetes mellitus without complications: Secondary | ICD-10-CM | POA: Diagnosis not present

## 2023-02-22 DIAGNOSIS — M1990 Unspecified osteoarthritis, unspecified site: Secondary | ICD-10-CM | POA: Diagnosis not present

## 2023-02-22 DIAGNOSIS — R2230 Localized swelling, mass and lump, unspecified upper limb: Secondary | ICD-10-CM | POA: Insufficient documentation

## 2023-02-22 LAB — CBC WITH DIFFERENTIAL (CANCER CENTER ONLY)
Abs Immature Granulocytes: 0.05 10*3/uL (ref 0.00–0.07)
Basophils Absolute: 0 10*3/uL (ref 0.0–0.1)
Basophils Relative: 1 %
Eosinophils Absolute: 0.2 10*3/uL (ref 0.0–0.5)
Eosinophils Relative: 5 %
HCT: 27 % — ABNORMAL LOW (ref 39.0–52.0)
Hemoglobin: 8.7 g/dL — ABNORMAL LOW (ref 13.0–17.0)
Immature Granulocytes: 1 %
Lymphocytes Relative: 18 %
Lymphs Abs: 1 10*3/uL (ref 0.7–4.0)
MCH: 25.3 pg — ABNORMAL LOW (ref 26.0–34.0)
MCHC: 32.2 g/dL (ref 30.0–36.0)
MCV: 78.5 fL — ABNORMAL LOW (ref 80.0–100.0)
Monocytes Absolute: 0.7 10*3/uL (ref 0.1–1.0)
Monocytes Relative: 14 %
Neutro Abs: 3.2 10*3/uL (ref 1.7–7.7)
Neutrophils Relative %: 61 %
Platelet Count: 164 10*3/uL (ref 150–400)
RBC: 3.44 MIL/uL — ABNORMAL LOW (ref 4.22–5.81)
RDW: 17.1 % — ABNORMAL HIGH (ref 11.5–15.5)
WBC Count: 5.2 10*3/uL (ref 4.0–10.5)
nRBC: 0 % (ref 0.0–0.2)

## 2023-02-22 LAB — CMP (CANCER CENTER ONLY)
ALT: 20 U/L (ref 0–44)
AST: 26 U/L (ref 15–41)
Albumin: 4.1 g/dL (ref 3.5–5.0)
Alkaline Phosphatase: 93 U/L (ref 38–126)
Anion gap: 11 (ref 5–15)
BUN: 32 mg/dL — ABNORMAL HIGH (ref 8–23)
CO2: 25 mmol/L (ref 22–32)
Calcium: 8.7 mg/dL — ABNORMAL LOW (ref 8.9–10.3)
Chloride: 104 mmol/L (ref 98–111)
Creatinine: 3 mg/dL — ABNORMAL HIGH (ref 0.61–1.24)
GFR, Estimated: 21 mL/min — ABNORMAL LOW (ref >60)
Glucose, Bld: 179 mg/dL — ABNORMAL HIGH (ref 70–99)
Potassium: 3.9 mmol/L (ref 3.5–5.1)
Sodium: 140 mmol/L (ref 135–145)
Total Bilirubin: 0.4 mg/dL (ref <1.2)
Total Protein: 6.9 g/dL (ref 6.5–8.1)

## 2023-02-22 LAB — LACTATE DEHYDROGENASE: LDH: 272 U/L — ABNORMAL HIGH (ref 98–192)

## 2023-02-22 LAB — FERRITIN: Ferritin: 1339 ng/mL — ABNORMAL HIGH (ref 24–336)

## 2023-02-22 LAB — IRON AND IRON BINDING CAPACITY (CC-WL,HP ONLY)
Iron: 81 ug/dL (ref 45–182)
Saturation Ratios: 35 % (ref 17.9–39.5)
TIBC: 232 ug/dL — ABNORMAL LOW (ref 250–450)
UIBC: 151 ug/dL (ref 117–376)

## 2023-02-22 MED ORDER — CYANOCOBALAMIN 1000 MCG/ML IJ SOLN
1000.0000 ug | Freq: Once | INTRAMUSCULAR | Status: AC
  Administered 2023-02-22: 1000 ug via INTRAMUSCULAR
  Filled 2023-02-22: qty 1

## 2023-02-22 MED ORDER — EPOETIN ALFA-EPBX 40000 UNIT/ML IJ SOLN
40000.0000 [IU] | Freq: Once | INTRAMUSCULAR | Status: AC
  Administered 2023-02-22: 40000 [IU] via SUBCUTANEOUS
  Filled 2023-02-22: qty 1

## 2023-02-22 MED ORDER — KETOROLAC TROMETHAMINE 15 MG/ML IJ SOLN
15.0000 mg | Freq: Once | INTRAMUSCULAR | Status: AC
Start: 2023-02-22 — End: 2023-02-22
  Administered 2023-02-22: 15 mg via INTRAVENOUS

## 2023-02-22 MED ORDER — SODIUM CHLORIDE 0.9 % IV SOLN
60.0000 mg | Freq: Once | INTRAVENOUS | Status: AC
  Administered 2023-02-22: 60 mg via INTRAVENOUS
  Filled 2023-02-22: qty 10

## 2023-02-22 MED ORDER — SODIUM CHLORIDE 0.9% FLUSH
10.0000 mL | Freq: Once | INTRAVENOUS | Status: AC
Start: 2023-02-22 — End: 2023-02-22
  Administered 2023-02-22: 10 mL via INTRAVENOUS

## 2023-02-22 MED ORDER — SODIUM CHLORIDE 0.9 % IV SOLN: INTRAVENOUS | Status: DC

## 2023-02-22 MED ORDER — HYDROMORPHONE HCL 1 MG/ML IJ SOLN
2.0000 mg | Freq: Once | INTRAMUSCULAR | Status: AC
  Administered 2023-02-22: 2 mg via INTRAVENOUS
  Filled 2023-02-22: qty 2

## 2023-02-22 MED ORDER — HEPARIN SOD (PORK) LOCK FLUSH 100 UNIT/ML IV SOLN
500.0000 [IU] | Freq: Once | INTRAVENOUS | Status: AC
Start: 2023-02-22 — End: 2023-02-22
  Administered 2023-02-22: 500 [IU] via INTRAVENOUS

## 2023-02-22 MED ORDER — SODIUM CHLORIDE 0.9 % IV SOLN
60.0000 mg | Freq: Once | INTRAVENOUS | Status: DC
  Filled 2023-02-22: qty 20

## 2023-02-22 MED ORDER — TESTOSTERONE CYPIONATE 200 MG/ML IM SOLN
400.0000 mg | Freq: Once | INTRAMUSCULAR | Status: AC
  Administered 2023-02-22: 400 mg via INTRAMUSCULAR
  Filled 2023-02-22: qty 2

## 2023-02-22 MED ORDER — PAMIDRONATE DISODIUM 30 MG/10ML IV SOLN: 60.0000 mg | Freq: Once | INTRAVENOUS | Status: DC

## 2023-02-22 MED ORDER — KETOROLAC TROMETHAMINE 15 MG/ML IJ SOLN
30.0000 mg | Freq: Once | INTRAMUSCULAR | Status: DC
  Filled 2023-02-22: qty 2

## 2023-02-22 NOTE — Patient Instructions (Signed)

## 2023-02-22 NOTE — Patient Instructions (Signed)
[facility] - A DEPT OF [facility]  Discharge Instructions: Thank you for choosing [facility] to provide your oncology and hematology care.   If you have a lab appointment with the Cancer Center, please go directly to the Cancer Center and check in at the registration area.  Wear comfortable clothing and clothing appropriate for easy access to any Portacath or PICC line.   We strive to give you quality time with your provider. You may need to reschedule your appointment if you arrive late (15 or more minutes).  Arriving late affects you and other patients whose appointments are after yours.  Also, if you miss three or more appointments without notifying the office, you may be dismissed from the clinic at the provider's discretion.      For prescription refill requests, have your pharmacy contact our office and allow 72 hours for refills to be completed.    Today you received the following chemotherapy and/or immunotherapy agents Aredia   To help prevent nausea and vomiting after your treatment, we encourage you to take your nausea medication as directed.  BELOW ARE SYMPTOMS THAT SHOULD BE REPORTED IMMEDIATELY: *FEVER GREATER THAN 100.4 F (38 C) OR HIGHER *CHILLS OR SWEATING *NAUSEA AND VOMITING THAT IS NOT CONTROLLED WITH YOUR NAUSEA MEDICATION *UNUSUAL SHORTNESS OF BREATH *UNUSUAL BRUISING OR BLEEDING *URINARY PROBLEMS (pain or burning when urinating, or frequent urination) *BOWEL PROBLEMS (unusual diarrhea, constipation, pain near the anus) TENDERNESS IN MOUTH AND THROAT WITH OR WITHOUT PRESENCE OF ULCERS (sore throat, sores in mouth, or a toothache) UNUSUAL RASH, SWELLING OR PAIN  UNUSUAL VAGINAL DISCHARGE OR ITCHING   Items with * indicate a potential emergency and should be followed up as soon as possible or go to the Emergency Department if any problems should occur.  Please show the CHEMOTHERAPY ALERT CARD or IMMUNOTHERAPY ALERT  CARD at check-in to the Emergency Department and triage nurse. Should you have questions after your visit or need to cancel or reschedule your appointment, please contact [facility] - A DEPT OF [facility]  [phone] and follow the prompts.  Office hours are 8:00 a.m. to 4:30 p.m. Monday - Friday. Please note that voicemails left after 4:00 p.m. may not be returned until the following business day.  We are closed weekends and major holidays. You have access to a nurse at all times for urgent questions. Please call the main number to the clinic [phone] and follow the prompts.  For any non-urgent questions, you may also contact your provider using MyChart. We now offer e-Visits for anyone 53 and older to request care online for non-urgent symptoms. For details visit [email or web address].   Also download the MyChart app! Go to the app store, search "MyChart", open the app, select [place], and log in with your MyChart username and password.

## 2023-02-22 NOTE — Progress Notes (Signed)
Hematology and Oncology Follow Up Visit  [name] [id number] 01/21/1951 72 y.o. 02/22/2023   Principle Diagnosis:  IgG kappa myeloma Anemia secondary to renal insufficiency Intermittent iron - deficiency anemia Hypotestosteronemia   Current Therapy:        Aredia 60 mg IV q  month  -- on hold starting 03/05/2022 -- needs tooth extrated --tooth extracted 03/2022 -started on 07/2022 Retacrit SQ as needed for hemoglobin less than 10 DepoTestosterone 400 mg q 4 weeks IV iron as indicated Kyprolis/Cytoxan - s/p cycle #40 --DC on 12/24/2022   Interim History:  [name] is here today for follow-up.  He continues off therapy.  Hopefully, we will be able to keep him off therapy for quite a while.  So far, I do not see any evidence that the myeloma is recurrent.  There is no monoclonal spike with his last blood work.  His IgG level was 1021.  His kappa light chain was 5.5 mg/dL.  He continues to be bothered by his arthritis.  He has diabetes.  He actually is doing pretty well with the arthritis.  He has had no fever.  He has had no change in bowel or bladder habits.  He has had no rashes.  There is been no leg swelling.  He also has low testosterone.  We are giving him testosterone supplementation.  His last testosterone level back in November was 882.  His iron studies today show ferritin of 1339 with an iron saturation of 35%.  Overall, I would say that his performance status is probably ECOG 1.     Medications:  Allergies as of 02/22/2023       Reactions   Iodinated Contrast Media Rash   Patient states he was instructed not to take IV contrast.  In 2008 he had an unknown reaction, and was told not to take it again.  He was also told not to take it due to his kidneys.   Ioversol    Prednisone    Iodine Anxiety, Rash, Other (See Comments)   Didn't feel right "instructed not to take per MD--something with his port"        Medication List        Accurate as of  February 22, 2023  2:19 PM. If you have any questions, ask your nurse or doctor.          amLODipine 10 MG tablet Commonly known as: NORVASC Take 10 mg by mouth daily.   ammonium lactate 12 % lotion Commonly known as: AmLactin Apply 1 application topically as needed for dry skin.   atorvastatin 40 MG tablet Commonly known as: LIPITOR Take 40 mg by mouth daily at 6 PM.   budesonide-formoterol 160-4.5 MCG/ACT inhaler Commonly known as: SYMBICORT SMARTSIG:2 Puff(s) By Mouth Twice Daily   Carafate 1 GM/10ML suspension Generic drug: sucralfate Take 1 g by mouth 4 (four) times daily.   carvedilol 25 MG tablet Commonly known as: COREG Take 25 mg by mouth 2 (two) times daily.   chlorpheniramine-HYDROcodone 10-8 MG/5ML Commonly known as: TUSSIONEX SMARTSIG:5 Milliliter(s) By Mouth Every 12 Hours   [employer] Fiber Gummies 2 g Chew Chew by mouth daily.   cyclobenzaprine 10 MG tablet Commonly known as: FLEXERIL Take 10 mg by mouth 3 (three) times daily as needed.   diazepam 5 MG tablet Commonly known as: VALIUM Take by mouth at bedtime as needed.   diclofenac Sodium 1 % Gel Commonly known as: VOLTAREN Apply topically.   dicyclomine 10 MG capsule Commonly  known as: BENTYL TAKE 1 CAPSULE BY MOUTH 3 TIMES A DAY BEFORE MEALS   dronabinol 5 MG capsule Commonly known as: MARINOL Take 1 capsule (5 mg total) by mouth 2 (two) times daily before a meal.   gabapentin 300 MG capsule Commonly known as: NEURONTIN TAKE 1 CAPSULE BY MOUTH THREE TIMES A DAY What changed:  how much to take how to take this when to take this additional instructions   glimepiride 2 MG tablet Commonly known as: AMARYL Take 2 mg by mouth daily.   hydrocortisone 2.5 % cream Apply topically 2 (two) times daily.   Januvia 100 MG tablet Generic drug: sitaGLIPtin Take 100 mg by mouth daily.   Klor-Con M20 20 MEQ tablet Generic drug: potassium chloride SA Take 20 mEq by mouth daily.   lactulose 10  GM/15ML solution Commonly known as: CHRONULAC Take 30 mLs (20 g total) by mouth daily.   latanoprost 0.005 % ophthalmic solution Commonly known as: XALATAN Place 1 drop into both eyes at bedtime.   linaclotide 145 MCG Caps capsule Commonly known as: LINZESS Take by mouth daily as needed.   losartan 50 MG tablet Commonly known as: COZAAR Take 50 mg by mouth daily.   Lumigan 0.01 % Soln Generic drug: bimatoprost Place 1 drop into both eyes 3 (three) times daily.   mesalamine 1.2 g EC tablet Commonly known as: LIALDA Take by mouth daily with breakfast.   methocarbamol 500 MG tablet Commonly known as: ROBAXIN Take 1 tablet (500 mg total) by mouth 2 (two) times daily.   metoprolol succinate 25 MG 24 hr tablet Commonly known as: TOPROL-XL Take 25 mg by mouth daily.   mirabegron ER 25 MG Tb24 tablet Commonly known as: MYRBETRIQ Take 20 mg by mouth daily.   morphine 30 MG 12 hr tablet Commonly known as: MS CONTIN Take 30 mg by mouth 2 (two) times daily.   omeprazole 40 MG capsule Commonly known as: PRILOSEC TAKE 1 CAPSULE BY MOUTH TWICE A DAY   oxyCODONE 5 MG immediate release tablet Commonly known as: Roxicodone 1 tab PO q6 hours prn pain   oxyCODONE-acetaminophen 10-325 MG tablet Commonly known as: PERCOCET Take 1 tablet by mouth every 6 (six) hours as needed for pain.   pioglitazone 30 MG tablet Commonly known as: ACTOS Take 30 mg by mouth daily.   polyethylene glycol powder 17 GM/SCOOP powder Commonly known as: GLYCOLAX/MIRALAX SMARTSIG:1 scoopful By Mouth Twice Daily PRN   prochlorperazine 10 MG tablet Commonly known as: COMPAZINE take 1 tablet by mouth every 6 hours if needed for nausea and vomiting   Restasis 0.05 % ophthalmic emulsion Generic drug: cycloSPORINE Place 1 drop into both eyes daily.   sildenafil 100 MG tablet Commonly known as: VIAGRA   SUMAtriptan 100 MG tablet Commonly known as: IMITREX Take 100 mg by mouth daily.   topiramate  25 MG capsule Commonly known as: TOPAMAX Take 25 mg by mouth 2 (two) times daily.   topiramate 50 MG tablet Commonly known as: TOPAMAX Take 50 mg by mouth at bedtime.   torsemide 20 MG tablet Commonly known as: DEMADEX Take 20 mg by mouth daily.   Vitamin D (Ergocalciferol) 1.25 MG (50000 UNIT) Caps capsule Commonly known as: DRISDOL Take 50,000 Units by mouth every Saturday.   Xtampza ER 13.5 MG C12a Generic drug: oxyCODONE ER daily.        Allergies:  Allergies  Allergen Reactions   Iodinated Contrast Media Rash    Patient states he was  instructed not to take IV contrast.  In 2008 he had an unknown reaction, and was told not to take it again.  He was also told not to take it due to his kidneys.   Ioversol    Prednisone    Iodine Anxiety, Rash and Other (See Comments)    Didn't feel right "instructed not to take per MD--something with his port"     Past Medical History, Surgical history, Social history, and Family History were reviewed and updated.  Review of Systems: Review of Systems  Constitutional:  Positive for malaise/fatigue.  HENT: Negative.    Eyes: Negative.   Respiratory: Negative.    Cardiovascular: Negative.   Gastrointestinal:  Positive for nausea.  Genitourinary: Negative.   Musculoskeletal:  Positive for joint pain and myalgias.  Skin: Negative.   Neurological: Negative.   Endo/Heme/Allergies: Negative.   Psychiatric/Behavioral: Negative.       Physical Exam: Vital signs are temperature of 98.2.  Pulse 76.  Blood pressure 137/71.  Weight is 169 pounds.    Wt Readings from Last 3 Encounters:  02/22/23 169 lb (76.7 kg)  01/20/23 165 lb (74.8 kg)  01/08/23 171 lb 0.6 oz (77.6 kg)   Physical Exam Vitals reviewed.  HENT:     Head: Normocephalic and atraumatic.  Eyes:     Pupils: Pupils are equal, round, and reactive to light.  Cardiovascular:     Rate and Rhythm: Normal rate and regular rhythm.     Heart sounds: Normal heart sounds.   Pulmonary:     Effort: Pulmonary effort is normal.     Breath sounds: Normal breath sounds.  Abdominal:     General: Bowel sounds are normal.     Palpations: Abdomen is soft.  Musculoskeletal:        General: No tenderness or deformity. Normal range of motion.     Cervical back: Normal range of motion.  Lymphadenopathy:     Cervical: No cervical adenopathy.  Skin:    General: Skin is warm and dry.     Findings: No erythema or rash.  Neurological:     Mental Status: He is alert and oriented to person, place, and time.  Psychiatric:        Behavior: Behavior normal.        Thought Content: Thought content normal.        Judgment: Judgment normal.       Lab Results  Component Value Date   WBC 5.2 02/22/2023   HGB 8.7 (L) 02/22/2023   HCT 27.0 (L) 02/22/2023   MCV 78.5 (L) 02/22/2023   PLT 164 02/22/2023   Lab Results  Component Value Date   FERRITIN 1,339 (H) 02/22/2023   IRON 81 02/22/2023   TIBC 232 (L) 02/22/2023   UIBC 151 02/22/2023   IRONPCTSAT 35 02/22/2023   Lab Results  Component Value Date   RETICCTPCT 1.4 11/12/2022   RBC 3.44 (L) 02/22/2023   RETICCTABS 27.5 03/21/2014   Lab Results  Component Value Date   KPAFRELGTCHN 55.1 (H) 01/08/2023   LAMBDASER 29.9 (H) 01/08/2023   KAPLAMBRATIO 1.84 (H) 01/08/2023   Lab Results  Component Value Date   IGGSERUM 1,021 01/08/2023   IGA 173 01/08/2023   IGMSERUM 21 01/08/2023   Lab Results  Component Value Date   TOTALPROTELP 6.5 01/08/2023   ALBUMINELP 3.7 01/08/2023   A1GS 0.2 01/08/2023   A2GS 0.8 01/08/2023   BETS 0.8 01/08/2023   BETA2SER 0.5 11/07/2014   GAMS 0.9 01/08/2023  MSPIKE Not Observed 01/08/2023   SPEI Comment 01/29/2022     Chemistry      Component Value Date/Time   NA 140 02/22/2023 0753   NA 143 02/22/2017 0744   K 3.9 02/22/2023 0753   K 4.0 02/22/2017 0744   CL 104 02/22/2023 0753   CL 106 02/22/2017 0744   CO2 25 02/22/2023 0753   CO2 27 02/22/2017 0744   BUN 32 (H)  02/22/2023 0753   BUN 26 (H) 02/22/2017 0744   CREATININE 3.00 (H) 02/22/2023 0753   CREATININE 2.3 (H) 02/22/2017 0744      Component Value Date/Time   CALCIUM 8.7 (L) 02/22/2023 0753   CALCIUM 9.3 02/22/2017 0744   ALKPHOS 93 02/22/2023 0753   ALKPHOS 80 02/22/2017 0744   AST 26 02/22/2023 0753   ALT 20 02/22/2023 0753   ALT 22 02/22/2017 0744   BILITOT 0.4 02/22/2023 0753       Impression and Plan: Mr. Bogacki is a very pleasant 72 yo African American gentleman with IgG kappa myeloma.  He had been on treatment for quite a while.  We now have him off treatment.  Again I had to believe that he is going to stay in remission.  I really do not think that myeloma is going to be a life defining illness for him.  I really think skin to be his arthritis in his diabetes that will really dictate his life and his quality of life.  Again, we will continue to hold the chemotherapy.  He definitely needs his Retacrit today.  He will get his Aredia today.  He will get his testosterone today.  I will plan to have him back to see [place] in another month or so.   Josph Macho, MD 12/30/20242:19 PM

## 2023-02-23 LAB — KAPPA/LAMBDA LIGHT CHAINS
Kappa free light chain: 71.9 mg/L — ABNORMAL HIGH (ref 3.3–19.4)
Kappa, lambda light chain ratio: 1.93 — ABNORMAL HIGH (ref 0.26–1.65)
Lambda free light chains: 37.2 mg/L — ABNORMAL HIGH (ref 5.7–26.3)

## 2023-02-23 LAB — IGG, IGA, IGM
IgA: 230 mg/dL (ref 61–437)
IgG (Immunoglobin G), Serum: 1403 mg/dL (ref 603–1613)
IgM (Immunoglobulin M), Srm: 26 mg/dL (ref 15–143)

## 2023-02-23 LAB — TESTOSTERONE: Testosterone: 222 ng/dL — ABNORMAL LOW (ref 264–916)

## 2023-02-25 LAB — PROTEIN ELECTROPHORESIS, SERUM, WITH REFLEX
A/G Ratio: 1 (ref 0.7–1.7)
Albumin ELP: 3.5 g/dL (ref 2.9–4.4)
Alpha-1-Globulin: 0.2 g/dL (ref 0.0–0.4)
Alpha-2-Globulin: 1 g/dL (ref 0.4–1.0)
Beta Globulin: 0.9 g/dL (ref 0.7–1.3)
Gamma Globulin: 1.4 g/dL (ref 0.4–1.8)
Globulin, Total: 3.5 g/dL (ref 2.2–3.9)
Total Protein ELP: 7 g/dL (ref 6.0–8.5)

## 2023-03-12 ENCOUNTER — Ambulatory Visit: Payer: 59 | Admitting: Podiatry

## 2023-03-22 ENCOUNTER — Inpatient Hospital Stay: Payer: 59

## 2023-03-22 ENCOUNTER — Inpatient Hospital Stay (HOSPITAL_BASED_OUTPATIENT_CLINIC_OR_DEPARTMENT_OTHER): Payer: 59 | Admitting: Hematology & Oncology

## 2023-03-22 ENCOUNTER — Other Ambulatory Visit: Payer: Self-pay

## 2023-03-22 ENCOUNTER — Inpatient Hospital Stay: Payer: 59 | Attending: Hematology & Oncology

## 2023-03-22 ENCOUNTER — Encounter: Payer: Self-pay | Admitting: Family

## 2023-03-22 VITALS — BP 162/58 | HR 81 | Temp 98.4°F | Resp 17 | Ht 71.0 in | Wt 173.0 lb

## 2023-03-22 DIAGNOSIS — E611 Iron deficiency: Secondary | ICD-10-CM | POA: Diagnosis not present

## 2023-03-22 DIAGNOSIS — E349 Endocrine disorder, unspecified: Secondary | ICD-10-CM

## 2023-03-22 DIAGNOSIS — E291 Testicular hypofunction: Secondary | ICD-10-CM | POA: Diagnosis not present

## 2023-03-22 DIAGNOSIS — D500 Iron deficiency anemia secondary to blood loss (chronic): Secondary | ICD-10-CM | POA: Diagnosis not present

## 2023-03-22 DIAGNOSIS — M2550 Pain in unspecified joint: Secondary | ICD-10-CM

## 2023-03-22 DIAGNOSIS — N184 Chronic kidney disease, stage 4 (severe): Secondary | ICD-10-CM | POA: Diagnosis present

## 2023-03-22 DIAGNOSIS — Z299 Encounter for prophylactic measures, unspecified: Secondary | ICD-10-CM

## 2023-03-22 DIAGNOSIS — D509 Iron deficiency anemia, unspecified: Secondary | ICD-10-CM | POA: Insufficient documentation

## 2023-03-22 DIAGNOSIS — C9000 Multiple myeloma not having achieved remission: Secondary | ICD-10-CM

## 2023-03-22 DIAGNOSIS — D631 Anemia in chronic kidney disease: Secondary | ICD-10-CM | POA: Insufficient documentation

## 2023-03-22 LAB — CBC WITH DIFFERENTIAL (CANCER CENTER ONLY)
Abs Immature Granulocytes: 0.07 10*3/uL (ref 0.00–0.07)
Basophils Absolute: 0.1 10*3/uL (ref 0.0–0.1)
Basophils Relative: 1 %
Eosinophils Absolute: 0.2 10*3/uL (ref 0.0–0.5)
Eosinophils Relative: 3 %
HCT: 29.7 % — ABNORMAL LOW (ref 39.0–52.0)
Hemoglobin: 9.4 g/dL — ABNORMAL LOW (ref 13.0–17.0)
Immature Granulocytes: 1 %
Lymphocytes Relative: 15 %
Lymphs Abs: 1 10*3/uL (ref 0.7–4.0)
MCH: 24.9 pg — ABNORMAL LOW (ref 26.0–34.0)
MCHC: 31.6 g/dL (ref 30.0–36.0)
MCV: 78.8 fL — ABNORMAL LOW (ref 80.0–100.0)
Monocytes Absolute: 0.8 10*3/uL (ref 0.1–1.0)
Monocytes Relative: 13 %
Neutro Abs: 4.4 10*3/uL (ref 1.7–7.7)
Neutrophils Relative %: 67 %
Platelet Count: 183 10*3/uL (ref 150–400)
RBC: 3.77 MIL/uL — ABNORMAL LOW (ref 4.22–5.81)
RDW: 16.3 % — ABNORMAL HIGH (ref 11.5–15.5)
WBC Count: 6.5 10*3/uL (ref 4.0–10.5)
nRBC: 0 % (ref 0.0–0.2)

## 2023-03-22 LAB — CMP (CANCER CENTER ONLY)
ALT: 22 U/L (ref 0–44)
AST: 21 U/L (ref 15–41)
Albumin: 3.9 g/dL (ref 3.5–5.0)
Alkaline Phosphatase: 85 U/L (ref 38–126)
Anion gap: 9 (ref 5–15)
BUN: 42 mg/dL — ABNORMAL HIGH (ref 8–23)
CO2: 26 mmol/L (ref 22–32)
Calcium: 7.8 mg/dL — ABNORMAL LOW (ref 8.9–10.3)
Chloride: 106 mmol/L (ref 98–111)
Creatinine: 3.36 mg/dL — ABNORMAL HIGH (ref 0.61–1.24)
GFR, Estimated: 19 mL/min — ABNORMAL LOW (ref >60)
Glucose, Bld: 177 mg/dL — ABNORMAL HIGH (ref 70–99)
Potassium: 3.8 mmol/L (ref 3.5–5.1)
Sodium: 141 mmol/L (ref 135–145)
Total Bilirubin: 0.3 mg/dL (ref 0.0–1.2)
Total Protein: 6.4 g/dL — ABNORMAL LOW (ref 6.5–8.1)

## 2023-03-22 LAB — IRON AND IRON BINDING CAPACITY (CC-WL,HP ONLY)
Iron: 95 ug/dL (ref 45–182)
Saturation Ratios: 41 % — ABNORMAL HIGH (ref 17.9–39.5)
TIBC: 230 ug/dL — ABNORMAL LOW (ref 250–450)
UIBC: 135 ug/dL (ref 117–376)

## 2023-03-22 LAB — FERRITIN: Ferritin: 1043 ng/mL — ABNORMAL HIGH (ref 24–336)

## 2023-03-22 LAB — LACTATE DEHYDROGENASE: LDH: 224 U/L — ABNORMAL HIGH (ref 98–192)

## 2023-03-22 MED ORDER — EPOETIN ALFA-EPBX 40000 UNIT/ML IJ SOLN
40000.0000 [IU] | Freq: Once | INTRAMUSCULAR | Status: AC
Start: 2023-03-22 — End: 2023-03-22
  Administered 2023-03-22: 40000 [IU] via SUBCUTANEOUS
  Filled 2023-03-22: qty 1

## 2023-03-22 MED ORDER — SODIUM CHLORIDE 0.9% FLUSH
10.0000 mL | INTRAVENOUS | Status: DC | PRN
  Administered 2023-03-22: 10 mL via INTRAVENOUS

## 2023-03-22 MED ORDER — HYDROMORPHONE HCL 1 MG/ML IJ SOLN
2.0000 mg | Freq: Once | INTRAMUSCULAR | Status: AC
Start: 2023-03-22 — End: 2023-03-22
  Administered 2023-03-22: 2 mg via INTRAVENOUS
  Filled 2023-03-22: qty 2

## 2023-03-22 MED ORDER — HEPARIN SOD (PORK) LOCK FLUSH 100 UNIT/ML IV SOLN
500.0000 [IU] | Freq: Once | INTRAVENOUS | Status: AC
  Administered 2023-03-22: 500 [IU] via INTRAVENOUS

## 2023-03-22 MED ORDER — CYANOCOBALAMIN 1000 MCG/ML IJ SOLN
1000.0000 ug | Freq: Once | INTRAMUSCULAR | Status: AC
Start: 2023-03-22 — End: 2023-03-22
  Administered 2023-03-22: 1000 ug via INTRAMUSCULAR
  Filled 2023-03-22: qty 1

## 2023-03-22 MED ORDER — SODIUM CHLORIDE 0.9 % IV SOLN: Freq: Once | INTRAVENOUS | Status: DC

## 2023-03-22 MED ORDER — TESTOSTERONE CYPIONATE 200 MG/ML IM SOLN
400.0000 mg | Freq: Once | INTRAMUSCULAR | Status: AC
  Administered 2023-03-22: 400 mg via INTRAMUSCULAR
  Filled 2023-03-22: qty 2

## 2023-03-22 NOTE — Patient Instructions (Signed)

## 2023-03-22 NOTE — Patient Instructions (Signed)

## 2023-03-22 NOTE — Progress Notes (Signed)
Hematology and Oncology Follow Up Visit  [name] [id number] 1950-08-18 73 y.o. 03/22/2023   Principle Diagnosis:  IgG kappa myeloma Anemia secondary to renal insufficiency Intermittent iron - deficiency anemia Hypotestosteronemia   Current Therapy:        Aredia 60 mg IV q  month  -- on hold starting 03/05/2022 -- needs tooth extrated --tooth extracted 03/2022 -started on 07/2022 Retacrit SQ as needed for hemoglobin less than 10 DepoTestosterone 400 mg q 4 weeks IV iron as indicated Kyprolis/Cytoxan - s/p cycle #40 --DC on 12/24/2022   Interim History:  [name] is here today for follow-up.  He continues off therapy.  He is doing pretty well.  He had no problems over the holiday season.  We have him off treatment.  We have him off treatment since October.  When we last saw him, his monoclonal spike was not found.  His IgG level was 1400 mg/dL.  The Kappa light chain was 7.9 mg/dL.  His arthritis is biggest problem.  He has horrible arthritis.  I know that the cold weather last week really hurt him.  I know he is having problems with this.  He has had no problems with bowels or bladder.  He has had no issues with cough or shortness of breath.  He has had no problems with fever.  He has had no bleeding.  His last testosterone level was 222 ng/dL.  His last iron studies that we did showed a ferritin of 1339 with an iron saturation of 35%.  Overall, I would say that his performance status is probably ECOG 1-2.  Medications:  Allergies as of 03/22/2023       Reactions   Iodinated Contrast Media Rash   Patient states he was instructed not to take IV contrast.  In 2008 he had an unknown reaction, and was told not to take it again.  He was also told not to take it due to his kidneys.   Ioversol    Prednisone    Iodine Anxiety, Rash, Other (See Comments)   Didn't feel right "instructed not to take per MD--something with his port"        Medication List         Accurate as of March 22, 2023  8:31 AM. If you have any questions, ask your nurse or doctor.          amLODipine 10 MG tablet Commonly known as: NORVASC Take 10 mg by mouth daily.   ammonium lactate 12 % lotion Commonly known as: AmLactin Apply 1 application topically as needed for dry skin.   atorvastatin 40 MG tablet Commonly known as: LIPITOR Take 40 mg by mouth daily at 6 PM.   budesonide-formoterol 160-4.5 MCG/ACT inhaler Commonly known as: SYMBICORT SMARTSIG:2 Puff(s) By Mouth Twice Daily   Carafate 1 GM/10ML suspension Generic drug: sucralfate Take 1 g by mouth 4 (four) times daily.   carvedilol 25 MG tablet Commonly known as: COREG Take 25 mg by mouth 2 (two) times daily.   chlorpheniramine-HYDROcodone 10-8 MG/5ML Commonly known as: TUSSIONEX SMARTSIG:5 Milliliter(s) By Mouth Every 12 Hours   [employer] Fiber Gummies 2 g Chew Chew by mouth daily.   cyclobenzaprine 10 MG tablet Commonly known as: FLEXERIL Take 10 mg by mouth 3 (three) times daily as needed.   diazepam 5 MG tablet Commonly known as: VALIUM Take by mouth at bedtime as needed.   diclofenac Sodium 1 % Gel Commonly known as: VOLTAREN Apply topically.   dicyclomine 10  MG capsule Commonly known as: BENTYL TAKE 1 CAPSULE BY MOUTH 3 TIMES A DAY BEFORE MEALS   dronabinol 5 MG capsule Commonly known as: MARINOL Take 1 capsule (5 mg total) by mouth 2 (two) times daily before a meal.   gabapentin 300 MG capsule Commonly known as: NEURONTIN TAKE 1 CAPSULE BY MOUTH THREE TIMES A DAY What changed:  how much to take how to take this when to take this additional instructions   glimepiride 2 MG tablet Commonly known as: AMARYL Take 2 mg by mouth daily.   hydrocortisone 2.5 % cream Apply topically 2 (two) times daily.   Januvia 100 MG tablet Generic drug: sitaGLIPtin Take 100 mg by mouth daily.   Klor-Con M20 20 MEQ tablet Generic drug: potassium chloride SA Take 20 mEq by mouth daily.    lactulose 10 GM/15ML solution Commonly known as: CHRONULAC Take 30 mLs (20 g total) by mouth daily.   latanoprost 0.005 % ophthalmic solution Commonly known as: XALATAN Place 1 drop into both eyes at bedtime.   linaclotide 145 MCG Caps capsule Commonly known as: LINZESS Take by mouth daily as needed.   losartan 50 MG tablet Commonly known as: COZAAR Take 50 mg by mouth daily.   Lumigan 0.01 % Soln Generic drug: bimatoprost Place 1 drop into both eyes 3 (three) times daily.   mesalamine 1.2 g EC tablet Commonly known as: LIALDA Take by mouth daily with breakfast.   methocarbamol 500 MG tablet Commonly known as: ROBAXIN Take 1 tablet (500 mg total) by mouth 2 (two) times daily.   metoprolol succinate 25 MG 24 hr tablet Commonly known as: TOPROL-XL Take 25 mg by mouth daily.   mirabegron ER 25 MG Tb24 tablet Commonly known as: MYRBETRIQ Take 20 mg by mouth daily.   morphine 30 MG 12 hr tablet Commonly known as: MS CONTIN Take 30 mg by mouth 2 (two) times daily.   omeprazole 40 MG capsule Commonly known as: PRILOSEC TAKE 1 CAPSULE BY MOUTH TWICE A DAY   oxyCODONE 5 MG immediate release tablet Commonly known as: Roxicodone 1 tab PO q6 hours prn pain   oxyCODONE-acetaminophen 10-325 MG tablet Commonly known as: PERCOCET Take 1 tablet by mouth every 6 (six) hours as needed for pain.   pioglitazone 30 MG tablet Commonly known as: ACTOS Take 30 mg by mouth daily.   polyethylene glycol powder 17 GM/SCOOP powder Commonly known as: GLYCOLAX/MIRALAX SMARTSIG:1 scoopful By Mouth Twice Daily PRN   prochlorperazine 10 MG tablet Commonly known as: COMPAZINE take 1 tablet by mouth every 6 hours if needed for nausea and vomiting   Restasis 0.05 % ophthalmic emulsion Generic drug: cycloSPORINE Place 1 drop into both eyes daily.   sildenafil 100 MG tablet Commonly known as: VIAGRA   SUMAtriptan 100 MG tablet Commonly known as: IMITREX Take 100 mg by mouth  daily.   topiramate 25 MG capsule Commonly known as: TOPAMAX Take 25 mg by mouth 2 (two) times daily.   topiramate 50 MG tablet Commonly known as: TOPAMAX Take 50 mg by mouth at bedtime.   torsemide 20 MG tablet Commonly known as: DEMADEX Take 20 mg by mouth daily.   Vitamin D (Ergocalciferol) 1.25 MG (50000 UNIT) Caps capsule Commonly known as: DRISDOL Take 50,000 Units by mouth every Saturday.   Xtampza ER 13.5 MG C12a Generic drug: oxyCODONE ER daily.        Allergies:  Allergies  Allergen Reactions   Iodinated Contrast Media Rash    Patient  states he was instructed not to take IV contrast.  In 2008 he had an unknown reaction, and was told not to take it again.  He was also told not to take it due to his kidneys.   Ioversol    Prednisone    Iodine Anxiety, Rash and Other (See Comments)    Didn't feel right "instructed not to take per MD--something with his port"     Past Medical History, Surgical history, Social history, and Family History were reviewed and updated.  Review of Systems: Review of Systems  Constitutional:  Positive for malaise/fatigue.  HENT: Negative.    Eyes: Negative.   Respiratory: Negative.    Cardiovascular: Negative.   Gastrointestinal:  Positive for nausea.  Genitourinary: Negative.   Musculoskeletal:  Positive for joint pain and myalgias.  Skin: Negative.   Neurological: Negative.   Endo/Heme/Allergies: Negative.   Psychiatric/Behavioral: Negative.       Physical Exam: Vital signs are temperature of 98.4.  Pulse 81.  Blood pressure 162/58.  Weight is 173 pounds.    Wt Readings from Last 3 Encounters:  03/22/23 173 lb (78.5 kg)  02/22/23 169 lb (76.7 kg)  01/20/23 165 lb (74.8 kg)   Physical Exam Vitals reviewed.  HENT:     Head: Normocephalic and atraumatic.  Eyes:     Pupils: Pupils are equal, round, and reactive to light.  Cardiovascular:     Rate and Rhythm: Normal rate and regular rhythm.     Heart sounds: Normal  heart sounds.  Pulmonary:     Effort: Pulmonary effort is normal.     Breath sounds: Normal breath sounds.  Abdominal:     General: Bowel sounds are normal.     Palpations: Abdomen is soft.  Musculoskeletal:        General: No tenderness or deformity. Normal range of motion.     Cervical back: Normal range of motion.  Lymphadenopathy:     Cervical: No cervical adenopathy.  Skin:    General: Skin is warm and dry.     Findings: No erythema or rash.  Neurological:     Mental Status: He is alert and oriented to person, place, and time.  Psychiatric:        Behavior: Behavior normal.        Thought Content: Thought content normal.        Judgment: Judgment normal.       Lab Results  Component Value Date   WBC 6.5 03/22/2023   HGB 9.4 (L) 03/22/2023   HCT 29.7 (L) 03/22/2023   MCV 78.8 (L) 03/22/2023   PLT 183 03/22/2023   Lab Results  Component Value Date   FERRITIN 1,339 (H) 02/22/2023   IRON 81 02/22/2023   TIBC 232 (L) 02/22/2023   UIBC 151 02/22/2023   IRONPCTSAT 35 02/22/2023   Lab Results  Component Value Date   RETICCTPCT 1.4 11/12/2022   RBC 3.77 (L) 03/22/2023   RETICCTABS 27.5 03/21/2014   Lab Results  Component Value Date   KPAFRELGTCHN 71.9 (H) 02/22/2023   LAMBDASER 37.2 (H) 02/22/2023   KAPLAMBRATIO 1.93 (H) 02/22/2023   Lab Results  Component Value Date   IGGSERUM 1,403 02/22/2023   IGA 230 02/22/2023   IGMSERUM 26 02/22/2023   Lab Results  Component Value Date   TOTALPROTELP 7.0 02/22/2023   ALBUMINELP 3.5 02/22/2023   A1GS 0.2 02/22/2023   A2GS 1.0 02/22/2023   BETS 0.9 02/22/2023   BETA2SER 0.5 11/07/2014   GAMS 1.4  02/22/2023   MSPIKE Not Observed 02/22/2023   SPEI Comment 01/29/2022     Chemistry      Component Value Date/Time   NA 140 02/22/2023 0753   NA 143 02/22/2017 0744   K 3.9 02/22/2023 0753   K 4.0 02/22/2017 0744   CL 104 02/22/2023 0753   CL 106 02/22/2017 0744   CO2 25 02/22/2023 0753   CO2 27 02/22/2017 0744    BUN 32 (H) 02/22/2023 0753   BUN 26 (H) 02/22/2017 0744   CREATININE 3.00 (H) 02/22/2023 0753   CREATININE 2.3 (H) 02/22/2017 0744      Component Value Date/Time   CALCIUM 8.7 (L) 02/22/2023 0753   CALCIUM 9.3 02/22/2017 0744   ALKPHOS 93 02/22/2023 0753   ALKPHOS 80 02/22/2017 0744   AST 26 02/22/2023 0753   ALT 20 02/22/2023 0753   ALT 22 02/22/2017 0744   BILITOT 0.4 02/22/2023 0753       Impression and Plan: [name] is a very pleasant 73 yo African American gentleman with IgG kappa myeloma.  He had been on treatment for quite a while.  We now have him off treatment.  Again I had to believe that he is going to stay in remission.  Overall, I think the myeloma is doing quite well and that we can still hold off on treatment for him.  We will going give him some Retacrit today.  I think he gets his testosterone also.  He does well with a radio as this does seem to help with his bony pain.  We will plan to see him back in another 6 weeks or so.  Adrian Macho, MD 1/27/20258:31 AM

## 2023-03-22 NOTE — Progress Notes (Signed)
No Pamidronate today per dr Myna Hidalgo because Calcium 7.8 .  Pharmacy notified.

## 2023-03-23 LAB — KAPPA/LAMBDA LIGHT CHAINS
Kappa free light chain: 41.3 mg/L — ABNORMAL HIGH (ref 3.3–19.4)
Kappa, lambda light chain ratio: 1.71 — ABNORMAL HIGH (ref 0.26–1.65)
Lambda free light chains: 24.2 mg/L (ref 5.7–26.3)

## 2023-03-24 LAB — PROTEIN ELECTROPHORESIS, SERUM, WITH REFLEX
A/G Ratio: 1.1 (ref 0.7–1.7)
Albumin ELP: 3.4 g/dL (ref 2.9–4.4)
Alpha-1-Globulin: 0.2 g/dL (ref 0.0–0.4)
Alpha-2-Globulin: 0.8 g/dL (ref 0.4–1.0)
Beta Globulin: 0.8 g/dL (ref 0.7–1.3)
Gamma Globulin: 1.2 g/dL (ref 0.4–1.8)
Globulin, Total: 3 g/dL (ref 2.2–3.9)
Total Protein ELP: 6.4 g/dL (ref 6.0–8.5)

## 2023-03-24 LAB — IGG, IGA, IGM
IgA: 210 mg/dL (ref 61–437)
IgG (Immunoglobin G), Serum: 1346 mg/dL (ref 603–1613)
IgM (Immunoglobulin M), Srm: 36 mg/dL (ref 15–143)

## 2023-04-02 ENCOUNTER — Ambulatory Visit: Payer: 59 | Admitting: Podiatry

## 2023-04-08 ENCOUNTER — Other Ambulatory Visit: Payer: Self-pay | Admitting: *Deleted

## 2023-04-08 ENCOUNTER — Inpatient Hospital Stay: Payer: 59 | Attending: Hematology & Oncology

## 2023-04-08 ENCOUNTER — Inpatient Hospital Stay: Payer: 59

## 2023-04-08 ENCOUNTER — Encounter: Payer: Self-pay | Admitting: Medical Oncology

## 2023-04-08 ENCOUNTER — Inpatient Hospital Stay (HOSPITAL_BASED_OUTPATIENT_CLINIC_OR_DEPARTMENT_OTHER): Payer: 59 | Admitting: Medical Oncology

## 2023-04-08 VITALS — BP 134/51 | HR 62 | Temp 98.7°F | Resp 18 | Ht 71.0 in | Wt 176.0 lb

## 2023-04-08 DIAGNOSIS — D509 Iron deficiency anemia, unspecified: Secondary | ICD-10-CM | POA: Diagnosis not present

## 2023-04-08 DIAGNOSIS — E349 Endocrine disorder, unspecified: Secondary | ICD-10-CM

## 2023-04-08 DIAGNOSIS — N189 Chronic kidney disease, unspecified: Secondary | ICD-10-CM

## 2023-04-08 DIAGNOSIS — Z299 Encounter for prophylactic measures, unspecified: Secondary | ICD-10-CM

## 2023-04-08 DIAGNOSIS — C9001 Multiple myeloma in remission: Secondary | ICD-10-CM

## 2023-04-08 DIAGNOSIS — C9000 Multiple myeloma not having achieved remission: Secondary | ICD-10-CM | POA: Diagnosis present

## 2023-04-08 DIAGNOSIS — M2550 Pain in unspecified joint: Secondary | ICD-10-CM

## 2023-04-08 DIAGNOSIS — D500 Iron deficiency anemia secondary to blood loss (chronic): Secondary | ICD-10-CM

## 2023-04-08 DIAGNOSIS — M4712 Other spondylosis with myelopathy, cervical region: Secondary | ICD-10-CM

## 2023-04-08 LAB — CBC WITH DIFFERENTIAL (CANCER CENTER ONLY)
Abs Immature Granulocytes: 0.02 10*3/uL (ref 0.00–0.07)
Basophils Absolute: 0.1 10*3/uL (ref 0.0–0.1)
Basophils Relative: 1 %
Eosinophils Absolute: 0.2 10*3/uL (ref 0.0–0.5)
Eosinophils Relative: 3 %
HCT: 31.9 % — ABNORMAL LOW (ref 39.0–52.0)
Hemoglobin: 10 g/dL — ABNORMAL LOW (ref 13.0–17.0)
Immature Granulocytes: 0 %
Lymphocytes Relative: 16 %
Lymphs Abs: 0.8 10*3/uL (ref 0.7–4.0)
MCH: 24.4 pg — ABNORMAL LOW (ref 26.0–34.0)
MCHC: 31.3 g/dL (ref 30.0–36.0)
MCV: 78 fL — ABNORMAL LOW (ref 80.0–100.0)
Monocytes Absolute: 0.6 10*3/uL (ref 0.1–1.0)
Monocytes Relative: 11 %
Neutro Abs: 3.5 10*3/uL (ref 1.7–7.7)
Neutrophils Relative %: 69 %
Platelet Count: 172 10*3/uL (ref 150–400)
RBC: 4.09 MIL/uL — ABNORMAL LOW (ref 4.22–5.81)
RDW: 17 % — ABNORMAL HIGH (ref 11.5–15.5)
WBC Count: 5.1 10*3/uL (ref 4.0–10.5)
nRBC: 0 % (ref 0.0–0.2)

## 2023-04-08 LAB — CMP (CANCER CENTER ONLY)
ALT: 9 U/L (ref 0–44)
AST: 14 U/L — ABNORMAL LOW (ref 15–41)
Albumin: 4 g/dL (ref 3.5–5.0)
Alkaline Phosphatase: 73 U/L (ref 38–126)
Anion gap: 9 (ref 5–15)
BUN: 38 mg/dL — ABNORMAL HIGH (ref 8–23)
CO2: 27 mmol/L (ref 22–32)
Calcium: 8.7 mg/dL — ABNORMAL LOW (ref 8.9–10.3)
Chloride: 103 mmol/L (ref 98–111)
Creatinine: 3.36 mg/dL — ABNORMAL HIGH (ref 0.61–1.24)
GFR, Estimated: 19 mL/min — ABNORMAL LOW (ref >60)
Glucose, Bld: 162 mg/dL — ABNORMAL HIGH (ref 70–99)
Potassium: 4.6 mmol/L (ref 3.5–5.1)
Sodium: 139 mmol/L (ref 135–145)
Total Bilirubin: 0.5 mg/dL (ref 0.0–1.2)
Total Protein: 7 g/dL (ref 6.5–8.1)

## 2023-04-08 MED ORDER — SODIUM CHLORIDE 0.9% FLUSH: 10.0000 mL | Freq: Once | INTRAVENOUS | Status: DC

## 2023-04-08 MED ORDER — HEPARIN SOD (PORK) LOCK FLUSH 100 UNIT/ML IV SOLN
500.0000 [IU] | Freq: Once | INTRAVENOUS | Status: AC
  Administered 2023-04-08: 500 [IU] via INTRAVENOUS

## 2023-04-08 MED ORDER — SODIUM CHLORIDE 0.9 % IV SOLN: Freq: Once | INTRAVENOUS | Status: AC

## 2023-04-08 MED ORDER — CYANOCOBALAMIN 1000 MCG/ML IJ SOLN
1000.0000 ug | Freq: Once | INTRAMUSCULAR | Status: AC
  Administered 2023-04-08: 1000 ug via INTRAMUSCULAR
  Filled 2023-04-08: qty 1

## 2023-04-08 MED ORDER — SODIUM CHLORIDE 0.9% FLUSH
10.0000 mL | INTRAVENOUS | Status: DC | PRN
  Administered 2023-04-08: 10 mL via INTRAVENOUS

## 2023-04-08 MED ORDER — HEPARIN SOD (PORK) LOCK FLUSH 100 UNIT/ML IV SOLN: 500.0000 [IU] | Freq: Once | INTRAVENOUS | Status: DC

## 2023-04-08 MED ORDER — SODIUM CHLORIDE 0.9 % IV SOLN
60.0000 mg | Freq: Once | INTRAVENOUS | Status: AC
  Administered 2023-04-08: 60 mg via INTRAVENOUS
  Filled 2023-04-08: qty 10

## 2023-04-08 NOTE — Patient Instructions (Signed)
Pamidronate Injection What is this medication? PAMIDRONATE (pa mi DROE nate) treats high calcium levels in the blood caused by cancer. It may also be used with chemotherapy to treat weakened bones caused by cancer. It can also be used to treat Paget's disease of the bone. It works by slowing down the release of calcium from bones. This lowers calcium levels in your blood. It also makes your bones stronger and less likely to break (fracture). It belongs to a group of medications called bisphosphonates. This medicine may be used for other purposes; ask your health care provider or pharmacist if you have questions. COMMON BRAND NAME(S): Aredia What should I tell my care team before I take this medication? They need to know if you have any of these conditions: Bleeding disorder Cancer Dental disease Kidney disease Low levels of calcium or other minerals in the blood Low red blood cell counts Receiving steroids, such as dexamethasone or prednisone An unusual or allergic reaction to pamidronate, other medications, foods, dyes or preservatives Pregnant or trying to get pregnant Breast-feeding How should I use this medication? This medication is injected into a vein. It is given by your care team in a hospital or clinic setting. Talk to your care team about the use of this medication in children. Special care may be needed. Overdosage: If you think you have taken too much of this medicine contact a poison control center or emergency room at once. NOTE: This medicine is only for you. Do not share this medicine with others. What if I miss a dose? Keep appointments for follow-up doses. It is important not to miss your dose. Call your care team if you are unable to keep an appointment. What may interact with this medication? Certain antibiotics given by injection Medications for inflammation or pain, such as ibuprofen, naproxen Some diuretics, such as bumetanide, furosemide Cyclosporine Parathyroid  hormone Tacrolimus Teriparatide Thalidomide This list may not describe all possible interactions. Give your health care provider a list of all the medicines, herbs, non-prescription drugs, or dietary supplements you use. Also tell them if you smoke, drink alcohol, or use illegal drugs. Some items may interact with your medicine. What should I watch for while using this medication? Visit your care team for regular checks on your progress. It may be some time before you see the benefit from this medication. Some people who take this medication have severe bone, joint, or muscle pain. This medication may also increase your risk for jaw problems or a broken thigh bone. Tell your care team right away if you have severe pain in your jaw, bones, joints, or muscles. Tell your care team if you have any pain that does not go away or that gets worse. Tell your dentist and dental surgeon that you are taking this medication. You should not have major dental surgery while on this medication. See your dentist to have a dental exam and fix any dental problems before starting this medication. Take good care of your teeth while on this medication. Make sure you see your dentist for regular follow-up appointments. You should make sure you get enough calcium and vitamin D while you are taking this medication. Discuss the foods you eat and the vitamins you take with your care team. You may need bloodwork while you are taking this medication. Talk to your care team if you wish to become pregnant or think you might be pregnant. This medication can cause serious birth defects. What side effects may I notice from receiving  this medication? Side effects that you should report to your care team as soon as possible: Allergic reactions--skin rash, itching, hives, swelling of the face, lips, tongue, or throat Kidney injury--decrease in the amount of urine, swelling of the ankles, hands, or feet Low calcium level--muscle pain or  cramps, confusion, tingling, or numbness in the hands or feet Osteonecrosis of the jaw--pain, swelling, or redness in the mouth, numbness of the jaw, poor healing after dental work, unusual discharge from the mouth, visible bones in the mouth Severe bone, joint, or muscle pain Side effects that usually do not require medical attention (report to your care team if they continue or are bothersome): Constipation Fatigue Fever Loss of appetite Nausea Pain, redness, or irritation at injection site Stomach pain This list may not describe all possible side effects. Call your doctor for medical advice about side effects. You may report side effects to FDA at 1-800-FDA-1088. Where should I keep my medication? This medication is given in a hospital or clinic. It will not be stored at home. NOTE: This sheet is a summary. It may not cover all possible information. If you have questions about this medicine, talk to your doctor, pharmacist, or health care provider.  2024 [employer]/Gold Standard (2021-03-31 00:00:00)

## 2023-04-08 NOTE — Progress Notes (Signed)
Ok to proceed with Pamidronate today with Ca = 8.7, Alb = 4.  [name], [place], BCPS, BCOP 04/08/2023 12:32 PM

## 2023-04-08 NOTE — Progress Notes (Signed)
Hematology and Oncology Follow Up Visit  [name] [id number] Aug 01, 1950 73 y.o. 04/08/2023   Principle Diagnosis:  IgG kappa myeloma Anemia secondary to renal insufficiency Intermittent iron - deficiency anemia Hypotestosteronemia   Current Therapy:        Aredia 60 mg IV q  month -- on hold starting 03/05/2022 -- needs tooth extrated --tooth extracted 03/2022 -started on 07/2022 Retacrit SQ as needed for hemoglobin less than 10 DepoTestosterone 400 mg q 4 weeks IV iron as indicated Kyprolis/Cytoxan - s/p cycle #40 --DC on 12/24/2022   Interim History:  [name] is here today for follow-up.  He continues to be off therapy since October 2024 and appears to be doing well in general.   He is here for consideration of Aredia. He last received this on Dec 30th 2024. He tolerated this well. He was due to get this infusion again at this last visit on 03/22/2023 however his calcium was to low. He is now on a twice daily calcium supplement.   No jaw pain, dental pain or upcoming dental work.   He has had no problems with bowels or bladder.  He has had no issues with cough or shortness of breath.  He has had no problems with fever.  He has had no bleeding.  Overall, I would say that his performance status is probably ECOG 1-2.  Medications:  Allergies as of 04/08/2023       Reactions   Iodinated Contrast Media Rash   Patient states he was instructed not to take IV contrast.  In 2008 he had an unknown reaction, and was told not to take it again.  He was also told not to take it due to his kidneys.   Ioversol    Prednisone    Iodine Anxiety, Rash, Other (See Comments)   Didn't feel right "instructed not to take per MD--something with his port"        Medication List        Accurate as of April 08, 2023  1:45 PM. If you have any questions, ask your nurse or doctor.          amLODipine 10 MG tablet Commonly known as: NORVASC Take 10 mg by mouth daily.   ammonium  lactate 12 % lotion Commonly known as: AmLactin Apply 1 application topically as needed for dry skin.   atorvastatin 40 MG tablet Commonly known as: LIPITOR Take 40 mg by mouth daily at 6 PM.   budesonide-formoterol 160-4.5 MCG/ACT inhaler Commonly known as: SYMBICORT SMARTSIG:2 Puff(s) By Mouth Twice Daily   Carafate 1 GM/10ML suspension Generic drug: sucralfate Take 1 g by mouth 4 (four) times daily.   carvedilol 25 MG tablet Commonly known as: COREG Take 25 mg by mouth 2 (two) times daily.   chlorpheniramine-HYDROcodone 10-8 MG/5ML Commonly known as: TUSSIONEX SMARTSIG:5 Milliliter(s) By Mouth Every 12 Hours   [employer] Fiber Gummies 2 g Chew Chew by mouth daily.   cyclobenzaprine 10 MG tablet Commonly known as: FLEXERIL Take 10 mg by mouth 3 (three) times daily as needed.   diazepam 5 MG tablet Commonly known as: VALIUM Take by mouth at bedtime as needed.   diclofenac Sodium 1 % Gel Commonly known as: VOLTAREN Apply topically.   dicyclomine 10 MG capsule Commonly known as: BENTYL TAKE 1 CAPSULE BY MOUTH 3 TIMES A DAY BEFORE MEALS   dronabinol 5 MG capsule Commonly known as: MARINOL Take 1 capsule (5 mg total) by mouth 2 (two) times daily before a  meal.   gabapentin 300 MG capsule Commonly known as: NEURONTIN TAKE 1 CAPSULE BY MOUTH THREE TIMES A DAY What changed:  how much to take how to take this when to take this additional instructions   glimepiride 2 MG tablet Commonly known as: AMARYL Take 2 mg by mouth daily.   hydrocortisone 2.5 % cream Apply topically 2 (two) times daily.   Januvia 100 MG tablet Generic drug: sitaGLIPtin Take 100 mg by mouth daily.   Klor-Con M20 20 MEQ tablet Generic drug: potassium chloride SA Take 20 mEq by mouth daily.   lactulose 10 GM/15ML solution Commonly known as: CHRONULAC Take 30 mLs (20 g total) by mouth daily.   latanoprost 0.005 % ophthalmic solution Commonly known as: XALATAN Place 1 drop into both eyes  at bedtime.   linaclotide 145 MCG Caps capsule Commonly known as: LINZESS Take by mouth daily as needed.   losartan 50 MG tablet Commonly known as: COZAAR Take 50 mg by mouth daily.   Lumigan 0.01 % Soln Generic drug: bimatoprost Place 1 drop into both eyes 3 (three) times daily.   mesalamine 1.2 g EC tablet Commonly known as: LIALDA Take by mouth daily with breakfast.   methocarbamol 500 MG tablet Commonly known as: ROBAXIN Take 1 tablet (500 mg total) by mouth 2 (two) times daily.   metoprolol succinate 25 MG 24 hr tablet Commonly known as: TOPROL-XL Take 25 mg by mouth daily.   mirabegron ER 25 MG Tb24 tablet Commonly known as: MYRBETRIQ Take 20 mg by mouth daily.   morphine 30 MG 12 hr tablet Commonly known as: MS CONTIN Take 30 mg by mouth 2 (two) times daily.   omeprazole 40 MG capsule Commonly known as: PRILOSEC TAKE 1 CAPSULE BY MOUTH TWICE A DAY   oxyCODONE 5 MG immediate release tablet Commonly known as: Roxicodone 1 tab PO q6 hours prn pain   oxyCODONE-acetaminophen 10-325 MG tablet Commonly known as: PERCOCET Take 1 tablet by mouth every 6 (six) hours as needed for pain.   pioglitazone 30 MG tablet Commonly known as: ACTOS Take 30 mg by mouth daily.   polyethylene glycol powder 17 GM/SCOOP powder Commonly known as: GLYCOLAX/MIRALAX SMARTSIG:1 scoopful By Mouth Twice Daily PRN   prochlorperazine 10 MG tablet Commonly known as: COMPAZINE take 1 tablet by mouth every 6 hours if needed for nausea and vomiting   Restasis 0.05 % ophthalmic emulsion Generic drug: cycloSPORINE Place 1 drop into both eyes daily.   sildenafil 100 MG tablet Commonly known as: VIAGRA   SUMAtriptan 100 MG tablet Commonly known as: IMITREX Take 100 mg by mouth daily.   topiramate 25 MG capsule Commonly known as: TOPAMAX Take 25 mg by mouth 2 (two) times daily.   topiramate 50 MG tablet Commonly known as: TOPAMAX Take 50 mg by mouth at bedtime.   torsemide 20  MG tablet Commonly known as: DEMADEX Take 20 mg by mouth daily.   Vitamin D (Ergocalciferol) 1.25 MG (50000 UNIT) Caps capsule Commonly known as: DRISDOL Take 50,000 Units by mouth every Saturday.   Xtampza ER 13.5 MG C12a Generic drug: oxyCODONE ER daily.        Allergies:  Allergies  Allergen Reactions   Iodinated Contrast Media Rash    Patient states he was instructed not to take IV contrast.  In 2008 he had an unknown reaction, and was told not to take it again.  He was also told not to take it due to his kidneys.   Ioversol  Prednisone    Iodine Anxiety, Rash and Other (See Comments)    Didn't feel right "instructed not to take per MD--something with his port"     Past Medical History, Surgical history, Social history, and Family History were reviewed and updated.  Review of Systems: Review of Systems  Constitutional:  Positive for malaise/fatigue.  HENT: Negative.    Eyes: Negative.   Respiratory: Negative.    Cardiovascular: Negative.   Gastrointestinal:  Positive for nausea.  Genitourinary: Negative.   Musculoskeletal:  Positive for joint pain and myalgias.  Skin: Negative.   Neurological: Negative.   Endo/Heme/Allergies: Negative.   Psychiatric/Behavioral: Negative.       Physical Exam: Vital signs are temperature of 98.4.  Pulse 81.  Blood pressure 162/58.  Weight is 173 pounds.    Wt Readings from Last 3 Encounters:  04/08/23 176 lb (79.8 kg)  03/22/23 173 lb (78.5 kg)  02/22/23 169 lb (76.7 kg)   Physical Exam Vitals reviewed.  HENT:     Head: Normocephalic and atraumatic.  Eyes:     Pupils: Pupils are equal, round, and reactive to light.  Cardiovascular:     Rate and Rhythm: Normal rate and regular rhythm.     Heart sounds: Normal heart sounds.  Pulmonary:     Effort: Pulmonary effort is normal.     Breath sounds: Normal breath sounds.  Abdominal:     General: Bowel sounds are normal.     Palpations: Abdomen is soft.   Musculoskeletal:        General: No tenderness or deformity. Normal range of motion.     Cervical back: Normal range of motion.  Lymphadenopathy:     Cervical: No cervical adenopathy.  Skin:    General: Skin is warm and dry.     Findings: No erythema or rash.  Neurological:     Mental Status: He is alert and oriented to person, place, and time.  Psychiatric:        Behavior: Behavior normal.        Thought Content: Thought content normal.        Judgment: Judgment normal.       Lab Results  Component Value Date   WBC 5.1 04/08/2023   HGB 10.0 (L) 04/08/2023   HCT 31.9 (L) 04/08/2023   MCV 78.0 (L) 04/08/2023   PLT 172 04/08/2023   Lab Results  Component Value Date   FERRITIN 1,043 (H) 03/22/2023   IRON 95 03/22/2023   TIBC 230 (L) 03/22/2023   UIBC 135 03/22/2023   IRONPCTSAT 41 (H) 03/22/2023   Lab Results  Component Value Date   RETICCTPCT 1.4 11/12/2022   RBC 4.09 (L) 04/08/2023   RETICCTABS 27.5 03/21/2014   Lab Results  Component Value Date   KPAFRELGTCHN 41.3 (H) 03/22/2023   LAMBDASER 24.2 03/22/2023   KAPLAMBRATIO 1.71 (H) 03/22/2023   Lab Results  Component Value Date   IGGSERUM 1,346 03/22/2023   IGA 210 03/22/2023   IGMSERUM 36 03/22/2023   Lab Results  Component Value Date   TOTALPROTELP 6.4 03/22/2023   ALBUMINELP 3.4 03/22/2023   A1GS 0.2 03/22/2023   A2GS 0.8 03/22/2023   BETS 0.8 03/22/2023   BETA2SER 0.5 11/07/2014   GAMS 1.2 03/22/2023   MSPIKE Not Observed 03/22/2023   SPEI Comment 01/29/2022     Chemistry      Component Value Date/Time   NA 139 04/08/2023 1050   NA 143 02/22/2017 0744   K 4.6 04/08/2023 1050  K 4.0 02/22/2017 0744   CL 103 04/08/2023 1050   CL 106 02/22/2017 0744   CO2 27 04/08/2023 1050   CO2 27 02/22/2017 0744   BUN 38 (H) 04/08/2023 1050   BUN 26 (H) 02/22/2017 0744   CREATININE 3.36 (H) 04/08/2023 1050   CREATININE 2.3 (H) 02/22/2017 0744      Component Value Date/Time   CALCIUM 8.7 (L)  04/08/2023 1050   CALCIUM 9.3 02/22/2017 0744   ALKPHOS 73 04/08/2023 1050   ALKPHOS 80 02/22/2017 0744   AST 14 (L) 04/08/2023 1050   ALT 9 04/08/2023 1050   ALT 22 02/22/2017 0744   BILITOT 0.5 04/08/2023 1050     Encounter Diagnosis  Name Primary?   Multiple myeloma not having achieved remission (HCC) Yes    Impression and Plan: Mr. Sperbeck is a very pleasant 73 yo African American gentleman with IgG kappa myeloma.    Hypocalcemia is improved. He will continue his calcium supplement   We had a long discussion about how we are no longer able to give acute pain medication due to risk of overdose and given controlled contract with PCP. He is understanding.   Disposition Aredia today Change his 05/04/2023 apts to 05/06/2023 if possible or any day thereafter within 1 week  Rushie Chestnut, [place] 2/13/20251:45 PM

## 2023-04-23 ENCOUNTER — Ambulatory Visit (INDEPENDENT_AMBULATORY_CARE_PROVIDER_SITE_OTHER): Payer: 59 | Admitting: Podiatry

## 2023-04-23 ENCOUNTER — Encounter: Payer: Self-pay | Admitting: Podiatry

## 2023-04-23 DIAGNOSIS — B351 Tinea unguium: Secondary | ICD-10-CM

## 2023-04-23 DIAGNOSIS — E1151 Type 2 diabetes mellitus with diabetic peripheral angiopathy without gangrene: Secondary | ICD-10-CM | POA: Diagnosis not present

## 2023-04-23 DIAGNOSIS — M79674 Pain in right toe(s): Secondary | ICD-10-CM | POA: Diagnosis not present

## 2023-04-23 DIAGNOSIS — M79675 Pain in left toe(s): Secondary | ICD-10-CM

## 2023-04-23 DIAGNOSIS — L84 Corns and callosities: Secondary | ICD-10-CM | POA: Diagnosis not present

## 2023-04-23 NOTE — Progress Notes (Signed)
 This patient returns to my office for at risk foot care.  This patient requires this care by a professional since this patient will be at risk due to having diabetes type 2.  This patient is unable to cut nails himself since the patient cannot reach his nails.These nails are painful walking and wearing shoes.  Painful callus under outside ball of both feet. As well as painful corn between his 4/5 toes right foot.  This patient presents for at risk foot care today.  General Appearance  Alert, conversant and in no acute stress.  Vascular  Dorsalis pedis and posterior tibial  pulses are weakly  palpable  bilaterally.  Capillary return is within normal limits  bilaterally. Cold feet bilaterally.  Absent digital hair.  Neurologic  Senn-Weinstein monofilament wire test within normal limits/diminished right.  LOPS absent left foot. Muscle power within normal limits bilaterally.  Nails Thick disfigured discolored nails with subungual debris  from hallux to fifth toes bilaterally. No evidence of bacterial infection or drainage bilaterally.  Orthopedic  No limitations of motion  feet .  No crepitus or effusions noted.  No bony pathology or digital deformities noted.  HAV  B/L.  Exostosis right foot.Plantar flexed fifth metatarsall B/l.  Skin  normotropic skin with no porokeratosis noted bilaterally.  No signs of infections or ulcers noted.  Callus sub 5th met  B/l asymptomatic.  HM 4th interspace right foot. No infection.  Onychomycosis  Pain in right toes  Pain in left toes    Consent was obtained for treatment procedures.   Mechanical debridement of nails 1-5  bilaterally performed with a nail nipper.  Filed with dremel without incident. Debride callus HM with # 15 blade and dremel tool.  Patient requests surgical correction fifth toe right foot.   Return office visit    10 weeks                  Told patient to return for periodic foot care and evaluation due to potential at risk  complications.   Helane Gunther DPM

## 2023-04-30 ENCOUNTER — Ambulatory Visit (INDEPENDENT_AMBULATORY_CARE_PROVIDER_SITE_OTHER): Payer: 59 | Admitting: Podiatry

## 2023-04-30 ENCOUNTER — Ambulatory Visit (INDEPENDENT_AMBULATORY_CARE_PROVIDER_SITE_OTHER)

## 2023-04-30 DIAGNOSIS — M216X1 Other acquired deformities of right foot: Secondary | ICD-10-CM

## 2023-04-30 DIAGNOSIS — Z01818 Encounter for other preprocedural examination: Secondary | ICD-10-CM

## 2023-04-30 DIAGNOSIS — M79671 Pain in right foot: Secondary | ICD-10-CM | POA: Diagnosis not present

## 2023-04-30 DIAGNOSIS — M2041 Other hammer toe(s) (acquired), right foot: Secondary | ICD-10-CM | POA: Diagnosis not present

## 2023-04-30 DIAGNOSIS — L84 Corns and callosities: Secondary | ICD-10-CM

## 2023-04-30 NOTE — Progress Notes (Signed)
 Subjective:  Patient ID: [name], male    DOB: Oct 24, 1950,  MRN: [id number]  Chief Complaint  Patient presents with   Toe Pain    Right foot 5th toe place in between the toe causing discomfort     73 y.o. male presents with the above complaint.  Patient presents with right painful plantarflexed fifth metatarsal with submetatarsal 5 lesion as well as right fifth digit hammertoe correction he states that is very painful to touch painful to walk on is being conservatively treated by Dr. Stacie Acres he is here for surgical consultation.  He is tried shoe gear modification padding protecting offloading debridement of the callus.  Pain scale is 5 out of 10 dull aching nature.  He denies any other acute complaintsDensity of he is a diabetic with last 5.2   Review of Systems: Negative except as noted in the HPI. Denies N/V/F/Ch.  Past Medical History:  Diagnosis Date   Anemia    Anemia of renal disease 03/12/2011   Anemia, iron deficiency 03/12/2011   Anxiety    Anxiety disorder    Arthralgia of multiple joints 12/31/2010   Arthritis    Constipation    Diabetes mellitus without complication (HCC)    Diabetic neuropathy (HCC)    Diverticulosis    Epidermoid cyst of skin of chest 08/29/2018   Fibromyalgia    GERD (gastroesophageal reflux disease)    Headache    Hemorrhoids    Herpes zoster    History of COVID-19 04/03/2019   Hyperlipidemia    Hypertension    Hypotestosteronism 03/17/2012   Multiple myeloma    Multiple myeloma (HCC)    Myeloma (HCC) 12/31/2010   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 (Patient not taking: Reported on 01/08/2023), Disp: 400 g, Rfl: 0   atorvastatin (LIPITOR) 40 MG  tablet, Take 40 mg by mouth daily at 6 PM. , Disp: , Rfl:    budesonide-formoterol (SYMBICORT) 160-4.5 MCG/ACT inhaler, SMARTSIG:2 Puff(s) By Mouth Twice Daily, Disp: , Rfl:    CARAFATE 1 GM/10ML suspension, Take 1 g by mouth 4 (four) times daily. , Disp: , Rfl:    carvedilol (COREG) 25 MG tablet, Take 25 mg by mouth 2 (two) times daily., Disp: , Rfl:    chlorpheniramine-HYDROcodone 10-8 MG/5ML, SMARTSIG:5 Milliliter(s) By Mouth Every 12 Hours, Disp: , Rfl:    [employer] Fiber Gummies 2 g CHEW, Chew by mouth daily., Disp: , Rfl:    cyclobenzaprine (FLEXERIL) 10 MG tablet, Take 10 mg by mouth 3 (three) times daily as needed., Disp: , Rfl:    diazepam (VALIUM) 5 MG tablet, Take by mouth at bedtime as needed., Disp: , Rfl:    diclofenac Sodium (VOLTAREN) 1 % GEL, Apply topically., Disp: , Rfl:    dicyclomine (BENTYL) 10 MG capsule, TAKE 1 CAPSULE BY MOUTH 3 TIMES A DAY BEFORE MEALS, Disp: 270 capsule, Rfl: 1   dronabinol (MARINOL) 5 MG capsule, Take 1 capsule (5 mg total) by mouth 2 (two) times daily before a meal., Disp: 60 capsule, Rfl: 1   gabapentin (NEURONTIN) 300 MG capsule, TAKE 1 CAPSULE BY MOUTH THREE TIMES A DAY (Patient taking differently:  Take 300 mg by mouth 3 (three) times daily.), Disp: 270 capsule, Rfl: 1   glimepiride (AMARYL) 2 MG tablet, Take 2 mg by mouth daily., Disp: , Rfl:    hydrocortisone 2.5 % cream, Apply topically 2 (two) times daily., Disp: , Rfl:    JANUVIA 100 MG tablet, Take 100 mg by mouth daily., Disp: , Rfl:    KLOR-CON M20 20 MEQ tablet, Take 20 mEq by mouth daily., Disp: , Rfl:    lactulose (CHRONULAC) 10 GM/15ML solution, Take 30 mLs (20 g total) by mouth daily., Disp: 946 mL, Rfl: 1   latanoprost (XALATAN) 0.005 % ophthalmic solution, Place 1 drop into both eyes at bedtime., Disp: , Rfl:    linaclotide (LINZESS) 145 MCG CAPS capsule, Take by mouth daily as needed., Disp: , Rfl:    losartan (COZAAR) 50 MG tablet, Take 50 mg by mouth daily., Disp: , Rfl:    LUMIGAN 0.01  % SOLN, Place 1 drop into both eyes 3 (three) times daily., Disp: , Rfl:    mesalamine (LIALDA) 1.2 g EC tablet, Take by mouth daily with breakfast., Disp: , Rfl:    methocarbamol (ROBAXIN) 500 MG tablet, Take 1 tablet (500 mg total) by mouth 2 (two) times daily., Disp: 20 tablet, Rfl: 0   metoprolol succinate (TOPROL-XL) 25 MG 24 hr tablet, Take 25 mg by mouth daily., Disp: , Rfl:    mirabegron ER (MYRBETRIQ) 25 MG TB24 tablet, Take 20 mg by mouth daily., Disp: , Rfl:    morphine (MS CONTIN) 30 MG 12 hr tablet, Take 30 mg by mouth 2 (two) times daily., Disp: , Rfl:    omeprazole (PRILOSEC) 40 MG capsule, TAKE 1 CAPSULE BY MOUTH TWICE A DAY, Disp: 180 capsule, Rfl: 0   oxyCODONE (ROXICODONE) 5 MG immediate release tablet, 1 tab PO q6 hours prn pain, Disp: 15 tablet, Rfl: 0   oxyCODONE ER (XTAMPZA ER) 13.5 MG C12A, daily., Disp: , Rfl:    oxyCODONE-acetaminophen (PERCOCET) 10-325 MG tablet, Take 1 tablet by mouth every 6 (six) hours as needed for pain., Disp: , Rfl:    pioglitazone (ACTOS) 30 MG tablet, Take 30 mg by mouth daily., Disp: , Rfl:    polyethylene glycol powder (GLYCOLAX/MIRALAX) 17 GM/SCOOP powder, SMARTSIG:1 scoopful By Mouth Twice Daily PRN, Disp: , Rfl:    prochlorperazine (COMPAZINE) 10 MG tablet, take 1 tablet by mouth every 6 hours if needed for nausea and vomiting, Disp: 30 tablet, Rfl: 1   RESTASIS 0.05 % ophthalmic emulsion, Place 1 drop into both eyes daily. , Disp: , Rfl:    sildenafil (VIAGRA) 100 MG tablet, , Disp: , Rfl:    SUMAtriptan (IMITREX) 100 MG tablet, Take 100 mg by mouth daily., Disp: , Rfl:    topiramate (TOPAMAX) 25 MG capsule, Take 25 mg by mouth 2 (two) times daily., Disp: , Rfl:    topiramate (TOPAMAX) 50 MG tablet, Take 50 mg by mouth at bedtime., Disp: , Rfl:    torsemide (DEMADEX) 20 MG tablet, Take 20 mg by mouth daily., Disp: , Rfl:    Vitamin D, Ergocalciferol, (DRISDOL) 50000 UNITS CAPS capsule, Take 50,000 Units by mouth every Saturday. , Disp: ,  Rfl:  No current facility-administered medications for this visit.  Facility-Administered Medications Ordered in Other Visits:    0.9 %  sodium chloride infusion, , Intravenous, Continuous, Erenest Blank, NP, Stopped at 10/20/19 1037   0.9 %  sodium chloride infusion, , Intravenous, Continuous, Ennever, Rose Phi, MD  furosemide (LASIX) injection 40 mg, 40 mg, Intravenous, Once, Ennever, Rose Phi, MD   HYDROmorphone (DILAUDID) 1 MG/ML injection, , , ,    morphine 4 MG/ML injection 4 mg, 4 mg, Intravenous, Q4H PRN, Ennever, Rose Phi, MD   sodium chloride flush (NS) 0.9 % injection 10 mL, 10 mL, Intravenous, PRN, Erenest Blank, NP, 10 mL at 10/20/19 1042  Social History   Tobacco Use  Smoking Status Never  Smokeless Tobacco Never    Allergies  Allergen Reactions   Iodinated Contrast Media Rash    Patient states he was instructed not to take IV contrast.  In 2008 he had an unknown reaction, and was told not to take it again.  He was also told not to take it due to his kidneys.   Ioversol    Prednisone    Iodine Anxiety, Rash and Other (See Comments)    Didn't feel right "instructed not to take per MD--something with his port"    Objective:  There were no vitals filed for this visit. There is no height or weight on file to calculate BMI. Constitutional Well developed. Well nourished.  Vascular Dorsalis pedis pulses palpable bilaterally. Posterior tibial pulses palpable bilaterally. Capillary refill normal to all digits.  No cyanosis or clubbing noted. Pedal hair growth normal.  Neurologic Normal speech. Oriented to person, place, and time. Epicritic sensation to light touch grossly present bilaterally.  Dermatologic Nails well groomed and normal in appearance. No open wounds. No skin lesions.  Orthopedic: Right plantarflexed fifth metatarsal noted with right semiflexible fifth digit hammertoe contracture noted with pain on palpation to both the sites.  No other bony  abnormalities identified.   Radiographs: \Three-view skeletally mature right foot: Plantarflexed fifth metatarsal noted hammertoe contracture with slight adductovarus deformity of the fifth digit noted.  No other bony abnormalities noted.  Midfoot arthritis noted mild posterior spurring noted Assessment:   1. Corns and callosities    Plan:  Patient was evaluated and treated and all questions answered.  Right plantarflexed fifth metatarsal with underlying hammertoe deformity -All questions and concerns were discussed with the patient in extensive detail -Given the amount of pain that he is having he will benefit from right plantarflexed fifth metatarsal osteotomy as well as right fifth digit hammertoe correction/arthroplasty.  I discussed my preoperative intra postop plan with the patient in extensive detail he would like to proceed with surgery. -Informed surgical risk consent was reviewed and read aloud to the patient.  I reviewed the films.  I have discussed my findings with the patient in great detail.  I have discussed all risks including but not limited to infection, stiffness, scarring, limp, disability, deformity, damage to blood vessels and nerves, numbness, poor healing, need for braces, arthritis, chronic pain, amputation, death.  All benefits and realistic expectations discussed in great detail.  I have made no promises as to the outcome.  I have provided realistic expectations.  I have offered the patient a 2nd opinion, which they have declined and assured me they preferred to proceed despite the risks   No follow-ups on file.

## 2023-05-03 ENCOUNTER — Telehealth: Payer: Self-pay

## 2023-05-03 NOTE — Telephone Encounter (Signed)
 Received surgery paperwork from Dr. Allena Katz. Left a message for [facility] to call and schedule surgery.

## 2023-05-04 ENCOUNTER — Other Ambulatory Visit: Payer: 59

## 2023-05-04 ENCOUNTER — Inpatient Hospital Stay: Payer: 59

## 2023-05-04 ENCOUNTER — Ambulatory Visit: Payer: 59

## 2023-05-04 ENCOUNTER — Ambulatory Visit: Payer: 59 | Admitting: Family

## 2023-05-04 ENCOUNTER — Ambulatory Visit: Payer: 59 | Admitting: Hematology & Oncology

## 2023-05-06 ENCOUNTER — Other Ambulatory Visit: Payer: Self-pay | Admitting: *Deleted

## 2023-05-06 ENCOUNTER — Inpatient Hospital Stay: Payer: 59 | Attending: Hematology & Oncology

## 2023-05-06 ENCOUNTER — Inpatient Hospital Stay (HOSPITAL_BASED_OUTPATIENT_CLINIC_OR_DEPARTMENT_OTHER): Payer: 59 | Admitting: Medical Oncology

## 2023-05-06 ENCOUNTER — Encounter: Payer: Self-pay | Admitting: Medical Oncology

## 2023-05-06 ENCOUNTER — Inpatient Hospital Stay: Payer: 59

## 2023-05-06 VITALS — BP 140/73 | HR 66 | Temp 98.0°F | Resp 17 | Ht 71.0 in | Wt 170.0 lb

## 2023-05-06 DIAGNOSIS — M898X9 Other specified disorders of bone, unspecified site: Secondary | ICD-10-CM | POA: Insufficient documentation

## 2023-05-06 DIAGNOSIS — M25562 Pain in left knee: Secondary | ICD-10-CM

## 2023-05-06 DIAGNOSIS — D631 Anemia in chronic kidney disease: Secondary | ICD-10-CM

## 2023-05-06 DIAGNOSIS — Z299 Encounter for prophylactic measures, unspecified: Secondary | ICD-10-CM

## 2023-05-06 DIAGNOSIS — C9001 Multiple myeloma in remission: Secondary | ICD-10-CM

## 2023-05-06 DIAGNOSIS — M2550 Pain in unspecified joint: Secondary | ICD-10-CM | POA: Diagnosis not present

## 2023-05-06 DIAGNOSIS — E611 Iron deficiency: Secondary | ICD-10-CM | POA: Diagnosis not present

## 2023-05-06 DIAGNOSIS — E291 Testicular hypofunction: Secondary | ICD-10-CM | POA: Diagnosis not present

## 2023-05-06 DIAGNOSIS — K921 Melena: Secondary | ICD-10-CM

## 2023-05-06 DIAGNOSIS — I10 Essential (primary) hypertension: Secondary | ICD-10-CM

## 2023-05-06 DIAGNOSIS — L84 Corns and callosities: Secondary | ICD-10-CM

## 2023-05-06 DIAGNOSIS — M4712 Other spondylosis with myelopathy, cervical region: Secondary | ICD-10-CM

## 2023-05-06 DIAGNOSIS — K317 Polyp of stomach and duodenum: Secondary | ICD-10-CM

## 2023-05-06 DIAGNOSIS — N189 Chronic kidney disease, unspecified: Secondary | ICD-10-CM | POA: Insufficient documentation

## 2023-05-06 DIAGNOSIS — R1084 Generalized abdominal pain: Secondary | ICD-10-CM

## 2023-05-06 DIAGNOSIS — R1319 Other dysphagia: Secondary | ICD-10-CM

## 2023-05-06 DIAGNOSIS — K224 Dyskinesia of esophagus: Secondary | ICD-10-CM

## 2023-05-06 DIAGNOSIS — E1122 Type 2 diabetes mellitus with diabetic chronic kidney disease: Secondary | ICD-10-CM | POA: Insufficient documentation

## 2023-05-06 DIAGNOSIS — J3489 Other specified disorders of nose and nasal sinuses: Secondary | ICD-10-CM

## 2023-05-06 DIAGNOSIS — M779 Enthesopathy, unspecified: Secondary | ICD-10-CM

## 2023-05-06 DIAGNOSIS — E349 Endocrine disorder, unspecified: Secondary | ICD-10-CM

## 2023-05-06 DIAGNOSIS — C9000 Multiple myeloma not having achieved remission: Secondary | ICD-10-CM

## 2023-05-06 DIAGNOSIS — K625 Hemorrhage of anus and rectum: Secondary | ICD-10-CM

## 2023-05-06 DIAGNOSIS — R109 Unspecified abdominal pain: Secondary | ICD-10-CM

## 2023-05-06 DIAGNOSIS — M1990 Unspecified osteoarthritis, unspecified site: Secondary | ICD-10-CM

## 2023-05-06 DIAGNOSIS — D500 Iron deficiency anemia secondary to blood loss (chronic): Secondary | ICD-10-CM

## 2023-05-06 DIAGNOSIS — N184 Chronic kidney disease, stage 4 (severe): Secondary | ICD-10-CM | POA: Insufficient documentation

## 2023-05-06 LAB — CBC WITH DIFFERENTIAL (CANCER CENTER ONLY)
Abs Immature Granulocytes: 0.02 10*3/uL (ref 0.00–0.07)
Basophils Absolute: 0.1 10*3/uL (ref 0.0–0.1)
Basophils Relative: 1 %
Eosinophils Absolute: 0.2 10*3/uL (ref 0.0–0.5)
Eosinophils Relative: 4 %
HCT: 29.8 % — ABNORMAL LOW (ref 39.0–52.0)
Hemoglobin: 9.3 g/dL — ABNORMAL LOW (ref 13.0–17.0)
Immature Granulocytes: 0 %
Lymphocytes Relative: 27 %
Lymphs Abs: 1.3 10*3/uL (ref 0.7–4.0)
MCH: 24.2 pg — ABNORMAL LOW (ref 26.0–34.0)
MCHC: 31.2 g/dL (ref 30.0–36.0)
MCV: 77.4 fL — ABNORMAL LOW (ref 80.0–100.0)
Monocytes Absolute: 0.7 10*3/uL (ref 0.1–1.0)
Monocytes Relative: 14 %
Neutro Abs: 2.6 10*3/uL (ref 1.7–7.7)
Neutrophils Relative %: 54 %
Platelet Count: 186 10*3/uL (ref 150–400)
RBC: 3.85 MIL/uL — ABNORMAL LOW (ref 4.22–5.81)
RDW: 16.7 % — ABNORMAL HIGH (ref 11.5–15.5)
WBC Count: 4.9 10*3/uL (ref 4.0–10.5)
nRBC: 0 % (ref 0.0–0.2)

## 2023-05-06 LAB — FERRITIN: Ferritin: 1184 ng/mL — ABNORMAL HIGH (ref 24–336)

## 2023-05-06 LAB — CMP (CANCER CENTER ONLY)
ALT: 27 U/L (ref 0–44)
AST: 28 U/L (ref 15–41)
Albumin: 4.1 g/dL (ref 3.5–5.0)
Alkaline Phosphatase: 85 U/L (ref 38–126)
Anion gap: 11 (ref 5–15)
BUN: 51 mg/dL — ABNORMAL HIGH (ref 8–23)
CO2: 25 mmol/L (ref 22–32)
Calcium: 8.6 mg/dL — ABNORMAL LOW (ref 8.9–10.3)
Chloride: 103 mmol/L (ref 98–111)
Creatinine: 3.46 mg/dL — ABNORMAL HIGH (ref 0.61–1.24)
GFR, Estimated: 18 mL/min — ABNORMAL LOW (ref >60)
Glucose, Bld: 78 mg/dL (ref 70–99)
Potassium: 4.2 mmol/L (ref 3.5–5.1)
Sodium: 139 mmol/L (ref 135–145)
Total Bilirubin: 0.4 mg/dL (ref 0.0–1.2)
Total Protein: 7.5 g/dL (ref 6.5–8.1)

## 2023-05-06 LAB — IRON AND IRON BINDING CAPACITY (CC-WL,HP ONLY)
Iron: 84 ug/dL (ref 45–182)
Saturation Ratios: 41 % — ABNORMAL HIGH (ref 17.9–39.5)
TIBC: 203 ug/dL — ABNORMAL LOW (ref 250–450)
UIBC: 119 ug/dL (ref 117–376)

## 2023-05-06 LAB — LACTATE DEHYDROGENASE: LDH: 230 U/L — ABNORMAL HIGH (ref 98–192)

## 2023-05-06 MED ORDER — SODIUM CHLORIDE 0.9 % IV SOLN
60.0000 mg | Freq: Once | INTRAVENOUS | Status: AC
  Administered 2023-05-06: 60 mg via INTRAVENOUS
  Filled 2023-05-06: qty 10

## 2023-05-06 MED ORDER — TESTOSTERONE CYPIONATE 200 MG/ML IM SOLN
400.0000 mg | Freq: Once | INTRAMUSCULAR | Status: AC
  Administered 2023-05-06: 400 mg via INTRAMUSCULAR
  Filled 2023-05-06: qty 2

## 2023-05-06 MED ORDER — CYANOCOBALAMIN 1000 MCG/ML IJ SOLN
1000.0000 ug | Freq: Once | INTRAMUSCULAR | Status: AC
  Administered 2023-05-06: 1000 ug via INTRAMUSCULAR
  Filled 2023-05-06: qty 1

## 2023-05-06 MED ORDER — IRON SUCROSE 20 MG/ML IV SOLN: 300.0000 mg | Freq: Once | INTRAVENOUS | Status: DC

## 2023-05-06 MED ORDER — SODIUM CHLORIDE 0.9 % IV SOLN: INTRAVENOUS | Status: DC

## 2023-05-06 MED ORDER — EPOETIN ALFA-EPBX 40000 UNIT/ML IJ SOLN
40000.0000 [IU] | Freq: Once | INTRAMUSCULAR | Status: AC
  Administered 2023-05-06: 40000 [IU] via SUBCUTANEOUS
  Filled 2023-05-06: qty 1

## 2023-05-06 MED ORDER — HYDROMORPHONE HCL 1 MG/ML IJ SOLN: 2.0000 mg | Freq: Once | INTRAMUSCULAR | Status: DC

## 2023-05-06 NOTE — Progress Notes (Signed)
 Hematology and Oncology Follow Up Visit  [name] [id number] [birth year] 73 y.o. 05/06/2023   Principle Diagnosis:  IgG kappa myeloma Anemia secondary to renal insufficiency Intermittent iron - deficiency anemia Hypotestosteronemia   Prior Therapy:        Kyprolis/Cytoxan - s/p cycle #40 --DC on 12/24/2022  Current Therapy:  Aredia 60 mg IV q  month -- Started on 07/2022 Retacrit SQ as needed for hemoglobin less than 10 DepoTestosterone 400 mg q 4 weeks IV iron as indicated   Interim History:  [name] is here today for follow-up and consideration of Aredia.  He continues to be off chemotherapy  since October 2024.   He states that he is doing fair. He has had chronic arthritis pain which causing him a lot of discomfort. He takes pain medication for this which is managed by a pain specialist. He reports that his specialist has planned an increase in his medication for next month. Hopefully this will help him with his overall pain.   He does struggle with occasional hypocalcemia which impact his ability to get his monthly Aredia. He continues to take calcium supplementation for this. No jaw pain, dental pain or upcoming dental work.   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 He has had progressive elevation of his creatinine.   He has had no problems with bowels or bladder.  He has had no issues with cough or shortness of breath.  He has had no problems with fever.  He has had no bleeding.  Appetite is down in general. He reports that he fills up fast.   Overall, I would say that his performance status is probably ECOG 1-2. Wt Readings from Last 3 Encounters:  05/06/23 170 lb (77.1 kg)  04/08/23 176 lb (79.8 kg)  03/22/23 173 lb (78.5 kg)    Medications:  Allergies as of 05/06/2023       Reactions   Iodinated Contrast Media Rash   Patient states he was instructed not to take IV contrast.  In 2008 he had an  unknown reaction, and was told not to take it again.  He was also told not to take it due to his kidneys.   Ioversol    Prednisone    Iodine Anxiety, Rash, Other (See Comments)   Didn't feel right "instructed not to take per MD--something with his port"        Medication List        Accurate as of May 06, 2023 12:22 PM. If you have any questions, ask your nurse or doctor.          amLODipine 10 MG tablet Commonly known as: NORVASC Take 10 mg by mouth daily.   ammonium lactate 12 % lotion Commonly known as: AmLactin Apply 1 application topically as needed for dry skin.   atorvastatin 40 MG tablet Commonly known as: LIPITOR Take 40 mg by mouth daily at 6 PM.   budesonide-formoterol 160-4.5 MCG/ACT inhaler Commonly known as: SYMBICORT SMARTSIG:2 Puff(s) By Mouth Twice Daily   Carafate 1 GM/10ML suspension Generic drug: sucralfate Take 1 g by mouth 4 (four) times daily.   carvedilol 25 MG tablet Commonly known as: COREG Take 25 mg by mouth 2 (two) times daily.   chlorpheniramine-HYDROcodone 10-8 MG/5ML Commonly known as: TUSSIONEX SMARTSIG:5 Milliliter(s) By Mouth Every 12 Hours   [employer] Fiber Gummies 2 g Chew Chew by mouth daily.   cyclobenzaprine 10 MG  tablet Commonly known as: FLEXERIL Take 10 mg by mouth 3 (three) times daily as needed.   diazepam 5 MG tablet Commonly known as: VALIUM Take by mouth at bedtime as needed.   diclofenac Sodium 1 % Gel Commonly known as: VOLTAREN Apply topically.   dicyclomine 10 MG capsule Commonly known as: BENTYL TAKE 1 CAPSULE BY MOUTH 3 TIMES A DAY BEFORE MEALS   dronabinol 5 MG capsule Commonly known as: MARINOL Take 1 capsule (5 mg total) by mouth 2 (two) times daily before a meal.   gabapentin 300 MG capsule Commonly known as: NEURONTIN TAKE 1 CAPSULE BY MOUTH THREE TIMES A DAY What changed:  how much to take how to take this when to take this additional instructions   glimepiride 2 MG tablet Commonly  known as: AMARYL Take 2 mg by mouth daily.   hydrocortisone 2.5 % cream Apply topically 2 (two) times daily.   Januvia 100 MG tablet Generic drug: sitaGLIPtin Take 100 mg by mouth daily.   Klor-Con M20 20 MEQ tablet Generic drug: potassium chloride SA Take 20 mEq by mouth daily.   lactulose 10 GM/15ML solution Commonly known as: CHRONULAC Take 30 mLs (20 g total) by mouth daily.   latanoprost 0.005 % ophthalmic solution Commonly known as: XALATAN Place 1 drop into both eyes at bedtime.   linaclotide 145 MCG Caps capsule Commonly known as: LINZESS Take by mouth daily as needed.   losartan 50 MG tablet Commonly known as: COZAAR Take 50 mg by mouth daily.   Lumigan 0.01 % Soln Generic drug: bimatoprost Place 1 drop into both eyes 3 (three) times daily.   mesalamine 1.2 g EC tablet Commonly known as: LIALDA Take by mouth daily with breakfast.   methocarbamol 500 MG tablet Commonly known as: ROBAXIN Take 1 tablet (500 mg total) by mouth 2 (two) times daily.   metoprolol succinate 25 MG 24 hr tablet Commonly known as: TOPROL-XL Take 25 mg by mouth daily.   mirabegron ER 25 MG Tb24 tablet Commonly known as: MYRBETRIQ Take 20 mg by mouth daily.   morphine 30 MG 12 hr tablet Commonly known as: MS CONTIN Take 30 mg by mouth 2 (two) times daily.   omeprazole 40 MG capsule Commonly known as: PRILOSEC TAKE 1 CAPSULE BY MOUTH TWICE A DAY   oxyCODONE 5 MG immediate release tablet Commonly known as: Roxicodone 1 tab PO q6 hours prn pain   oxyCODONE-acetaminophen 10-325 MG tablet Commonly known as: PERCOCET Take 1 tablet by mouth every 6 (six) hours as needed for pain.   pioglitazone 30 MG tablet Commonly known as: ACTOS Take 30 mg by mouth daily.   polyethylene glycol powder 17 GM/SCOOP powder Commonly known as: GLYCOLAX/MIRALAX SMARTSIG:1 scoopful By Mouth Twice Daily PRN   prochlorperazine 10 MG tablet Commonly known as: COMPAZINE take 1 tablet by mouth  every 6 hours if needed for nausea and vomiting   Restasis 0.05 % ophthalmic emulsion Generic drug: cycloSPORINE Place 1 drop into both eyes daily.   sildenafil 100 MG tablet Commonly known as: VIAGRA   SUMAtriptan 100 MG tablet Commonly known as: IMITREX Take 100 mg by mouth daily.   topiramate 25 MG capsule Commonly known as: TOPAMAX Take 25 mg by mouth 2 (two) times daily.   topiramate 50 MG tablet Commonly known as: TOPAMAX Take 50 mg by mouth at bedtime.   torsemide 20 MG tablet Commonly known as: DEMADEX Take 20 mg by mouth daily.   Vitamin D (Ergocalciferol) 1.25 MG (  50000 UNIT) Caps capsule Commonly known as: DRISDOL Take 50,000 Units by mouth every Saturday.   Xtampza ER 13.5 MG C12a Generic drug: oxyCODONE ER daily.        Allergies:  Allergies  Allergen Reactions   Iodinated Contrast Media Rash    Patient states he was instructed not to take IV contrast.  In 2008 he had an unknown reaction, and was told not to take it again.  He was also told not to take it due to his kidneys.   Ioversol    Prednisone    Iodine Anxiety, Rash and Other (See Comments)    Didn't feel right "instructed not to take per MD--something with his port"     Past Medical History, Surgical history, Social history, and Family History were reviewed and updated.  Review of Systems: Review of Systems  Constitutional:  Positive for malaise/fatigue.  HENT: Negative.    Eyes: Negative.   Respiratory: Negative.    Cardiovascular: Negative.   Gastrointestinal:  Negative for nausea.  Genitourinary: Negative.   Musculoskeletal:  Positive for joint pain and myalgias.  Skin: Negative.   Neurological: Negative.   Endo/Heme/Allergies: Negative.   Psychiatric/Behavioral: Negative.       Physical Exam: Vitals:   05/06/23 0850  BP: (!) 140/73  Pulse: 66  Resp: 17  Temp: 98 F (36.7 C)  SpO2: 100%     Wt Readings from Last 3 Encounters:  05/06/23 170 lb (77.1 kg)   04/08/23 176 lb (79.8 kg)  03/22/23 173 lb (78.5 kg)   Physical Exam Vitals reviewed.  HENT:     Head: Normocephalic and atraumatic.  Eyes:     Pupils: Pupils are equal, round, and reactive to light.  Cardiovascular:     Rate and Rhythm: Normal rate and regular rhythm.     Heart sounds: Normal heart sounds.  Pulmonary:     Effort: Pulmonary effort is normal.     Breath sounds: Normal breath sounds.  Abdominal:     General: Bowel sounds are normal.     Palpations: Abdomen is soft.  Musculoskeletal:        General: No tenderness or deformity. Normal range of motion.     Cervical back: Normal range of motion.  Lymphadenopathy:     Cervical: No cervical adenopathy.  Skin:    General: Skin is warm and dry.     Findings: No erythema or rash.  Neurological:     Mental Status: He is alert and oriented to person, place, and time.  Psychiatric:        Behavior: Behavior normal.        Thought Content: Thought content normal.        Judgment: Judgment normal.       Lab Results  Component Value Date   WBC 4.9 05/06/2023   HGB 9.3 (L) 05/06/2023   HCT 29.8 (L) 05/06/2023   MCV 77.4 (L) 05/06/2023   PLT 186 05/06/2023   Lab Results  Component Value Date   FERRITIN 1,043 (H) 03/22/2023   IRON 95 03/22/2023   TIBC 230 (L) 03/22/2023   UIBC 135 03/22/2023   IRONPCTSAT 41 (H) 03/22/2023   Lab Results  Component Value Date   RETICCTPCT 1.4 11/12/2022   RBC 3.85 (L) 05/06/2023   RETICCTABS 27.5 03/21/2014   Lab Results  Component Value Date   KPAFRELGTCHN 41.3 (H) 03/22/2023   LAMBDASER 24.2 03/22/2023   KAPLAMBRATIO 1.71 (H) 03/22/2023   Lab Results  Component Value Date  IGGSERUM 1,346 03/22/2023   IGA 210 03/22/2023   IGMSERUM 36 03/22/2023   Lab Results  Component Value Date   TOTALPROTELP 6.4 03/22/2023   ALBUMINELP 3.4 03/22/2023   A1GS 0.2 03/22/2023   A2GS 0.8 03/22/2023   BETS 0.8 03/22/2023   BETA2SER 0.5 11/07/2014   GAMS 1.2 03/22/2023    MSPIKE Not Observed 03/22/2023   SPEI Comment 01/29/2022     Chemistry      Component Value Date/Time   NA 139 05/06/2023 0830   NA 143 02/22/2017 0744   K 4.2 05/06/2023 0830   K 4.0 02/22/2017 0744   CL 103 05/06/2023 0830   CL 106 02/22/2017 0744   CO2 25 05/06/2023 0830   CO2 27 02/22/2017 0744   BUN 51 (H) 05/06/2023 0830   BUN 26 (H) 02/22/2017 0744   CREATININE 3.46 (H) 05/06/2023 0830   CREATININE 2.3 (H) 02/22/2017 0744      Component Value Date/Time   CALCIUM 8.6 (L) 05/06/2023 0830   CALCIUM 9.3 02/22/2017 0744   ALKPHOS 85 05/06/2023 0830   ALKPHOS 80 02/22/2017 0744   AST 28 05/06/2023 0830   ALT 27 05/06/2023 0830   ALT 22 02/22/2017 0744   BILITOT 0.4 05/06/2023 0830     Encounter Diagnoses  Name Primary?   Anemia of chronic renal failure, unspecified CKD stage Yes   Multiple myeloma not having achieved remission (HCC)    Arthralgia of multiple joints     Impression and Plan: Mr. Borges is a very pleasant 73 yo African American gentleman with IgG kappa myeloma. He is off of treatment due to poor tolerance and patient preference.   Most recent SPEP shows no observed M-spike. His kappa light chains were improved from 71.9 mg/L to 41.3 mg/L, IgG stable at 1.346 mg/dL. Though this all is reassuring he is concerned with his worsening bone pains that has become more generalized. I have suggested a met bone survey and Vitamin D level checked at his last next visit. He will continue follow up with his pain specialist.   Hypocalcemia is still present but acceptable for treatment today. He will continue his calcium supplement   Disposition Aredia today RTC 1 months MD/APP, port labs (CBC w/, CMP, LDH, MM panel, light chains, Vitamin D, iron, ferritin)  Rushie Chestnut, PA-C 3/13/202512:22 PM

## 2023-05-06 NOTE — Progress Notes (Signed)
 No Venofer today.  Anola Gurney [place], [place], BCPS, BCOP 05/06/2023 9:44 AM

## 2023-05-06 NOTE — Patient Instructions (Signed)

## 2023-05-06 NOTE — Patient Instructions (Signed)
 Pamidronate Injection What is this medication? PAMIDRONATE (pa mi DROE nate) treats high calcium levels in the blood caused by cancer. It may also be used with chemotherapy to treat weakened bones caused by cancer. It can also be used to treat Paget's disease of the bone. It works by slowing down the release of calcium from bones. This lowers calcium levels in your blood. It also makes your bones stronger and less likely to break (fracture). It belongs to a group of medications called bisphosphonates. This medicine may be used for other purposes; ask your health care provider or pharmacist if you have questions. COMMON BRAND NAME(S): Aredia What should I tell my care team before I take this medication? They need to know if you have any of these conditions: Bleeding disorder Cancer Dental disease Kidney disease Low levels of calcium or other minerals in the blood Low red blood cell counts Receiving steroids, such as dexamethasone or prednisone An unusual or allergic reaction to pamidronate, other medications, foods, dyes or preservatives Pregnant or trying to get pregnant Breast-feeding How should I use this medication? This medication is injected into a vein. It is given by your care team in a hospital or clinic setting. Talk to your care team about the use of this medication in children. Special care may be needed. Overdosage: If you think you have taken too much of this medicine contact a poison control center or emergency room at once. NOTE: This medicine is only for you. Do not share this medicine with others. What if I miss a dose? Keep appointments for follow-up doses. It is important not to miss your dose. Call your care team if you are unable to keep an appointment. What may interact with this medication? Certain antibiotics given by injection Medications for inflammation or pain, such as ibuprofen, naproxen Some diuretics, such as bumetanide, furosemide Cyclosporine Parathyroid  hormone Tacrolimus Teriparatide Thalidomide This list may not describe all possible interactions. Give your health care provider a list of all the medicines, herbs, non-prescription drugs, or dietary supplements you use. Also tell them if you smoke, drink alcohol, or use illegal drugs. Some items may interact with your medicine. What should I watch for while using this medication? Visit your care team for regular checks on your progress. It may be some time before you see the benefit from this medication. Some people who take this medication have severe bone, joint, or muscle pain. This medication may also increase your risk for jaw problems or a broken thigh bone. Tell your care team right away if you have severe pain in your jaw, bones, joints, or muscles. Tell your care team if you have any pain that does not go away or that gets worse. Tell your dentist and dental surgeon that you are taking this medication. You should not have major dental surgery while on this medication. See your dentist to have a dental exam and fix any dental problems before starting this medication. Take good care of your teeth while on this medication. Make sure you see your dentist for regular follow-up appointments. You should make sure you get enough calcium and vitamin D while you are taking this medication. Discuss the foods you eat and the vitamins you take with your care team. You may need bloodwork while you are taking this medication. Talk to your care team if you wish to become pregnant or think you might be pregnant. This medication can cause serious birth defects. What side effects may I notice from receiving  this medication? Side effects that you should report to your care team as soon as possible: Allergic reactions--skin rash, itching, hives, swelling of the face, lips, tongue, or throat Kidney injury--decrease in the amount of urine, swelling of the ankles, hands, or feet Low calcium level--muscle pain or  cramps, confusion, tingling, or numbness in the hands or feet Osteonecrosis of the jaw--pain, swelling, or redness in the mouth, numbness of the jaw, poor healing after dental work, unusual discharge from the mouth, visible bones in the mouth Severe bone, joint, or muscle pain Side effects that usually do not require medical attention (report to your care team if they continue or are bothersome): Constipation Fatigue Fever Loss of appetite Nausea Pain, redness, or irritation at injection site Stomach pain This list may not describe all possible side effects. Call your doctor for medical advice about side effects. You may report side effects to FDA at 1-800-FDA-1088. Where should I keep my medication? This medication is given in a hospital or clinic. It will not be stored at home. NOTE: This sheet is a summary. It may not cover all possible information. If you have questions about this medicine, talk to your doctor, pharmacist, or health care provider.  2024 Elsevier/Gold Standard (2021-03-31 00:00:00)

## 2023-05-06 NOTE — Progress Notes (Signed)
 Per Clent Jacks, PA pt to receive Aredia today. Ok with Calcium of 8.6 per PA. dph

## 2023-05-07 LAB — IGG, IGA, IGM
IgA: 254 mg/dL (ref 61–437)
IgG (Immunoglobin G), Serum: 1561 mg/dL (ref 603–1613)
IgM (Immunoglobulin M), Srm: 42 mg/dL (ref 15–143)

## 2023-05-07 LAB — KAPPA/LAMBDA LIGHT CHAINS
Kappa free light chain: 73.8 mg/L — ABNORMAL HIGH (ref 3.3–19.4)
Kappa, lambda light chain ratio: 1.72 — ABNORMAL HIGH (ref 0.26–1.65)
Lambda free light chains: 42.8 mg/L — ABNORMAL HIGH (ref 5.7–26.3)

## 2023-05-10 LAB — PROTEIN ELECTROPHORESIS, SERUM
A/G Ratio: 1 (ref 0.7–1.7)
Albumin ELP: 3.5 g/dL (ref 2.9–4.4)
Alpha-1-Globulin: 0.2 g/dL (ref 0.0–0.4)
Alpha-2-Globulin: 1 g/dL (ref 0.4–1.0)
Beta Globulin: 0.8 g/dL (ref 0.7–1.3)
Gamma Globulin: 1.4 g/dL (ref 0.4–1.8)
Globulin, Total: 3.4 g/dL (ref 2.2–3.9)
Total Protein ELP: 6.9 g/dL (ref 6.0–8.5)

## 2023-05-12 NOTE — Telephone Encounter (Unsigned)
 Copied from CRM [id number]. Topic: Clinical - Medical Advice >> May 12, 2023  2:32 PM Gildardo Pounds wrote: Reason for CRM: [employer] because patient is not taking Statin for her diabetes diagnosis. They recommend the patient take it. Callback number [phone]

## 2023-05-14 ENCOUNTER — Ambulatory Visit (HOSPITAL_COMMUNITY)
Admission: RE | Admit: 2023-05-14 | Discharge: 2023-05-14 | Disposition: A | Discharge status: Home / Self Care | Source: Ambulatory Visit | Attending: Medical Oncology | Admitting: Medical Oncology

## 2023-05-14 DIAGNOSIS — C9000 Multiple myeloma not having achieved remission: Secondary | ICD-10-CM | POA: Diagnosis present

## 2023-06-01 ENCOUNTER — Telehealth: Payer: Self-pay | Admitting: *Deleted

## 2023-06-01 NOTE — Telephone Encounter (Signed)
-----   Message from [name] sent at 06/01/2023  9:53 AM EDT ----- Please let him know that his bone survey did not show any lesions of concern in his bones

## 2023-06-01 NOTE — Telephone Encounter (Signed)
 As noted below by Clent Jacks, PA, I informed the patient that the bone survey did not show any lesions of concern in his bones. He verbalized understanding.

## 2023-06-03 ENCOUNTER — Inpatient Hospital Stay: Attending: Hematology & Oncology

## 2023-06-03 ENCOUNTER — Inpatient Hospital Stay

## 2023-06-03 ENCOUNTER — Telehealth: Payer: Self-pay | Admitting: Dietician

## 2023-06-03 ENCOUNTER — Inpatient Hospital Stay (HOSPITAL_BASED_OUTPATIENT_CLINIC_OR_DEPARTMENT_OTHER): Admitting: Hematology & Oncology

## 2023-06-03 ENCOUNTER — Encounter: Payer: Self-pay | Admitting: Hematology & Oncology

## 2023-06-03 ENCOUNTER — Other Ambulatory Visit: Payer: Self-pay

## 2023-06-03 VITALS — BP 130/62 | HR 58 | Resp 18

## 2023-06-03 VITALS — BP 140/63 | HR 61 | Temp 98.4°F | Resp 18 | Ht 71.0 in | Wt 169.0 lb

## 2023-06-03 DIAGNOSIS — E611 Iron deficiency: Secondary | ICD-10-CM | POA: Diagnosis not present

## 2023-06-03 DIAGNOSIS — E291 Testicular hypofunction: Secondary | ICD-10-CM | POA: Diagnosis not present

## 2023-06-03 DIAGNOSIS — D500 Iron deficiency anemia secondary to blood loss (chronic): Secondary | ICD-10-CM | POA: Diagnosis not present

## 2023-06-03 DIAGNOSIS — D631 Anemia in chronic kidney disease: Secondary | ICD-10-CM

## 2023-06-03 DIAGNOSIS — M2550 Pain in unspecified joint: Secondary | ICD-10-CM | POA: Diagnosis not present

## 2023-06-03 DIAGNOSIS — N184 Chronic kidney disease, stage 4 (severe): Secondary | ICD-10-CM | POA: Diagnosis not present

## 2023-06-03 DIAGNOSIS — C9000 Multiple myeloma not having achieved remission: Secondary | ICD-10-CM | POA: Diagnosis present

## 2023-06-03 DIAGNOSIS — N189 Chronic kidney disease, unspecified: Secondary | ICD-10-CM | POA: Diagnosis not present

## 2023-06-03 DIAGNOSIS — M4712 Other spondylosis with myelopathy, cervical region: Secondary | ICD-10-CM

## 2023-06-03 DIAGNOSIS — E349 Endocrine disorder, unspecified: Secondary | ICD-10-CM

## 2023-06-03 DIAGNOSIS — Z299 Encounter for prophylactic measures, unspecified: Secondary | ICD-10-CM

## 2023-06-03 LAB — CMP (CANCER CENTER ONLY)
ALT: 24 U/L (ref 0–44)
AST: 25 U/L (ref 15–41)
Albumin: 3.9 g/dL (ref 3.5–5.0)
Alkaline Phosphatase: 102 U/L (ref 38–126)
Anion gap: 8 (ref 5–15)
BUN: 49 mg/dL — ABNORMAL HIGH (ref 8–23)
CO2: 27 mmol/L (ref 22–32)
Calcium: 8.8 mg/dL — ABNORMAL LOW (ref 8.9–10.3)
Chloride: 102 mmol/L (ref 98–111)
Creatinine: 3.12 mg/dL — ABNORMAL HIGH (ref 0.61–1.24)
GFR, Estimated: 20 mL/min — ABNORMAL LOW (ref >60)
Glucose, Bld: 83 mg/dL (ref 70–99)
Potassium: 4.3 mmol/L (ref 3.5–5.1)
Sodium: 137 mmol/L (ref 135–145)
Total Bilirubin: 0.2 mg/dL (ref 0.0–1.2)
Total Protein: 7.4 g/dL (ref 6.5–8.1)

## 2023-06-03 LAB — CBC WITH DIFFERENTIAL (CANCER CENTER ONLY)
Abs Immature Granulocytes: 0.05 10*3/uL (ref 0.00–0.07)
Basophils Absolute: 0.1 10*3/uL (ref 0.0–0.1)
Basophils Relative: 1 %
Eosinophils Absolute: 0.2 10*3/uL (ref 0.0–0.5)
Eosinophils Relative: 3 %
HCT: 27.7 % — ABNORMAL LOW (ref 39.0–52.0)
Hemoglobin: 8.6 g/dL — ABNORMAL LOW (ref 13.0–17.0)
Immature Granulocytes: 1 %
Lymphocytes Relative: 15 %
Lymphs Abs: 1.1 10*3/uL (ref 0.7–4.0)
MCH: 23.8 pg — ABNORMAL LOW (ref 26.0–34.0)
MCHC: 31 g/dL (ref 30.0–36.0)
MCV: 76.7 fL — ABNORMAL LOW (ref 80.0–100.0)
Monocytes Absolute: 1 10*3/uL (ref 0.1–1.0)
Monocytes Relative: 14 %
Neutro Abs: 4.7 10*3/uL (ref 1.7–7.7)
Neutrophils Relative %: 66 %
Platelet Count: 218 10*3/uL (ref 150–400)
RBC: 3.61 MIL/uL — ABNORMAL LOW (ref 4.22–5.81)
RDW: 18.3 % — ABNORMAL HIGH (ref 11.5–15.5)
WBC Count: 7.1 10*3/uL (ref 4.0–10.5)
nRBC: 0 % (ref 0.0–0.2)

## 2023-06-03 LAB — IRON AND IRON BINDING CAPACITY (CC-WL,HP ONLY)
Iron: 50 ug/dL (ref 45–182)
Saturation Ratios: 25 % (ref 17.9–39.5)
TIBC: 197 ug/dL — ABNORMAL LOW (ref 250–450)
UIBC: 147 ug/dL (ref 117–376)

## 2023-06-03 LAB — FERRITIN: Ferritin: 1352 ng/mL — ABNORMAL HIGH (ref 24–336)

## 2023-06-03 LAB — VITAMIN D 25 HYDROXY (VIT D DEFICIENCY, FRACTURES): Vit D, 25-Hydroxy: 112.37 ng/mL — ABNORMAL HIGH (ref 30–100)

## 2023-06-03 LAB — LACTATE DEHYDROGENASE: LDH: 217 U/L — ABNORMAL HIGH (ref 98–192)

## 2023-06-03 MED ORDER — CYANOCOBALAMIN 1000 MCG/ML IJ SOLN
1000.0000 ug | Freq: Once | INTRAMUSCULAR | Status: AC
  Administered 2023-06-03: 1000 ug via INTRAMUSCULAR
  Filled 2023-06-03: qty 1

## 2023-06-03 MED ORDER — EPOETIN ALFA-EPBX 40000 UNIT/ML IJ SOLN
40000.0000 [IU] | Freq: Once | INTRAMUSCULAR | Status: AC
  Administered 2023-06-03: 40000 [IU] via SUBCUTANEOUS
  Filled 2023-06-03: qty 1

## 2023-06-03 MED ORDER — HYDROMORPHONE HCL 1 MG/ML IJ SOLN
2.0000 mg | Freq: Once | INTRAMUSCULAR | Status: AC
  Administered 2023-06-03: 2 mg via INTRAVENOUS
  Filled 2023-06-03: qty 2

## 2023-06-03 MED ORDER — TESTOSTERONE CYPIONATE 200 MG/ML IM SOLN
400.0000 mg | Freq: Once | INTRAMUSCULAR | Status: AC
  Administered 2023-06-03: 400 mg via INTRAMUSCULAR
  Filled 2023-06-03: qty 2

## 2023-06-03 MED ORDER — SODIUM CHLORIDE 0.9 % IV SOLN
60.0000 mg | Freq: Once | INTRAVENOUS | Status: AC
  Administered 2023-06-03: 60 mg via INTRAVENOUS
  Filled 2023-06-03: qty 10

## 2023-06-03 MED ORDER — HEPARIN SOD (PORK) LOCK FLUSH 100 UNIT/ML IV SOLN
500.0000 [IU] | Freq: Once | INTRAVENOUS | Status: AC
Start: 2023-06-03 — End: 2023-06-03
  Administered 2023-06-03: 500 [IU] via INTRAVENOUS

## 2023-06-03 MED ORDER — SODIUM CHLORIDE 0.9 % IV SOLN: INTRAVENOUS | Status: DC

## 2023-06-03 MED ORDER — SODIUM CHLORIDE 0.9% FLUSH
10.0000 mL | INTRAVENOUS | Status: DC | PRN
  Administered 2023-06-03: 10 mL via INTRAVENOUS

## 2023-06-03 NOTE — Telephone Encounter (Signed)
 Patient screened on MST. First attempt to reach since nutrition consult last year. Patient said he couldn't talk freely as he was in the cancer center now.  Sent text with contact information to return call to set up a nutrition consult.  Gennaro Africa, RDN, LDN Registered Dietitian, [facility] Part Time Remote (Usual office hours: Tuesday-Thursday) Cell: [phone]

## 2023-06-03 NOTE — Progress Notes (Unsigned)
 Hematology and Oncology Follow Up Visit  [name] [id number] 1950/05/06 73 y.o. 06/03/2023   Principle Diagnosis:  IgG kappa myeloma Anemia secondary to renal insufficiency Intermittent iron - deficiency anemia Hypotestosteronemia   Prior Therapy:        Kyprolis/Cytoxan - s/p cycle #40 --DC on 12/24/2022  Current Therapy:  Aredia 60 mg IV q  month -- Started on 07/2022 Retacrit SQ as needed for hemoglobin less than 10 DepoTestosterone 400 mg q 4 weeks IV iron as indicated   Interim History:  [name] is here today for follow-up.I am still a bit worried about what is going on.  His hemoglobin is higher and is lower now.  Much as to why it would be lower.  He is getting the Retacrit.  He has been off therapy now for 6 months.  So far, for his myeloma studies, we have not seen any obvious progressive disease.  When we last checked his myeloma studies back in March, there is no monoclonal spike in his blood.  His IgG level was 1561 mg/dL.  The Kappa light chain was up a little bit at 7.4 mg/dL.  I am going to get a CT scan to make sure were not missing anything else.  He is on testosterone.  I will check a PSA on him to make sure there is nothing going on with respect to possibility of prostate cancer.  He has had no fever.  His blood sugars seem to be doing a bit better.  He has had no bleeding.  There is been no obvious change in bowel or bladder habits.  He has not noted any swollen lymph nodes.  He has had no rashes.  Currently, I would have to say that his performance status is probably ECOG 2.   Wt Readings from Last 3 Encounters:  05/06/23 170 lb (77.1 kg)  04/08/23 176 lb (79.8 kg)  03/22/23 173 lb (78.5 kg)    Medications:  Allergies as of 06/03/2023       Reactions   Iodinated Contrast Media Rash   Patient states he was instructed not to take IV contrast.  In 2008 he had an unknown reaction, and was told not to take it again.  He was also told not to take  it due to his kidneys.   Ioversol    Prednisone    Iodine Anxiety, Rash, Other (See Comments)   Didn't feel right "instructed not to take per MD--something with his port"        Medication List        Accurate as of June 03, 2023  8:16 AM. If you have any questions, ask your nurse or doctor.          amLODipine 10 MG tablet Commonly known as: NORVASC Take 10 mg by mouth daily.   ammonium lactate 12 % lotion Commonly known as: AmLactin Apply 1 application topically as needed for dry skin.   atorvastatin 40 MG tablet Commonly known as: LIPITOR Take 40 mg by mouth daily at 6 PM.   budesonide-formoterol 160-4.5 MCG/ACT inhaler Commonly known as: SYMBICORT SMARTSIG:2 Puff(s) By Mouth Twice Daily   Carafate 1 GM/10ML suspension Generic drug: sucralfate Take 1 g by mouth 4 (four) times daily.   carvedilol 25 MG tablet Commonly known as: COREG Take 25 mg by mouth 2 (two) times daily.   chlorpheniramine-HYDROcodone 10-8 MG/5ML Commonly known as: TUSSIONEX SMARTSIG:5 Milliliter(s) By Mouth Every 12 Hours   [employer] Fiber Gummies 2 g Chew  Chew by mouth daily.   cyclobenzaprine 10 MG tablet Commonly known as: FLEXERIL Take 10 mg by mouth 3 (three) times daily as needed.   diazepam 5 MG tablet Commonly known as: VALIUM Take by mouth at bedtime as needed.   diclofenac Sodium 1 % Gel Commonly known as: VOLTAREN Apply topically.   dicyclomine 10 MG capsule Commonly known as: BENTYL TAKE 1 CAPSULE BY MOUTH 3 TIMES A DAY BEFORE MEALS   dronabinol 5 MG capsule Commonly known as: MARINOL Take 1 capsule (5 mg total) by mouth 2 (two) times daily before a meal.   gabapentin 300 MG capsule Commonly known as: NEURONTIN TAKE 1 CAPSULE BY MOUTH THREE TIMES A DAY What changed:  how much to take how to take this when to take this additional instructions   glimepiride 2 MG tablet Commonly known as: AMARYL Take 2 mg by mouth daily.   hydrocortisone 2.5 % cream Apply  topically 2 (two) times daily.   Januvia 100 MG tablet Generic drug: sitaGLIPtin Take 100 mg by mouth daily.   Klor-Con M20 20 MEQ tablet Generic drug: potassium chloride SA Take 20 mEq by mouth daily.   lactulose 10 GM/15ML solution Commonly known as: CHRONULAC Take 30 mLs (20 g total) by mouth daily.   latanoprost 0.005 % ophthalmic solution Commonly known as: XALATAN Place 1 drop into both eyes at bedtime.   linaclotide 145 MCG Caps capsule Commonly known as: LINZESS Take by mouth daily as needed.   losartan 50 MG tablet Commonly known as: COZAAR Take 50 mg by mouth daily.   Lumigan 0.01 % Soln Generic drug: bimatoprost Place 1 drop into both eyes 3 (three) times daily.   mesalamine 1.2 g EC tablet Commonly known as: LIALDA Take by mouth daily with breakfast.   methocarbamol 500 MG tablet Commonly known as: ROBAXIN Take 1 tablet (500 mg total) by mouth 2 (two) times daily.   metoprolol succinate 25 MG 24 hr tablet Commonly known as: TOPROL-XL Take 25 mg by mouth daily.   mirabegron ER 25 MG Tb24 tablet Commonly known as: MYRBETRIQ Take 20 mg by mouth daily.   morphine 30 MG 12 hr tablet Commonly known as: MS CONTIN Take 30 mg by mouth 2 (two) times daily.   omeprazole 40 MG capsule Commonly known as: PRILOSEC TAKE 1 CAPSULE BY MOUTH TWICE A DAY   oxyCODONE 5 MG immediate release tablet Commonly known as: Roxicodone 1 tab PO q6 hours prn pain   oxyCODONE-acetaminophen 10-325 MG tablet Commonly known as: PERCOCET Take 1 tablet by mouth every 6 (six) hours as needed for pain.   pioglitazone 30 MG tablet Commonly known as: ACTOS Take 30 mg by mouth daily.   polyethylene glycol powder 17 GM/SCOOP powder Commonly known as: GLYCOLAX/MIRALAX SMARTSIG:1 scoopful By Mouth Twice Daily PRN   prochlorperazine 10 MG tablet Commonly known as: COMPAZINE take 1 tablet by mouth every 6 hours if needed for nausea and vomiting   Restasis 0.05 % ophthalmic  emulsion Generic drug: cycloSPORINE Place 1 drop into both eyes daily.   sildenafil 100 MG tablet Commonly known as: VIAGRA   SUMAtriptan 100 MG tablet Commonly known as: IMITREX Take 100 mg by mouth daily.   topiramate 25 MG capsule Commonly known as: TOPAMAX Take 25 mg by mouth 2 (two) times daily.   topiramate 50 MG tablet Commonly known as: TOPAMAX Take 50 mg by mouth at bedtime.   torsemide 20 MG tablet Commonly known as: DEMADEX Take 20 mg by  mouth daily.   Vitamin D (Ergocalciferol) 1.25 MG (50000 UNIT) Caps capsule Commonly known as: DRISDOL Take 50,000 Units by mouth every Saturday.   Xtampza ER 13.5 MG C12a Generic drug: oxyCODONE ER daily.        Allergies:  Allergies  Allergen Reactions   Iodinated Contrast Media Rash    Patient states he was instructed not to take IV contrast.  In 2008 he had an unknown reaction, and was told not to take it again.  He was also told not to take it due to his kidneys.   Ioversol    Prednisone    Iodine Anxiety, Rash and Other (See Comments)    Didn't feel right "instructed not to take per MD--something with his port"     Past Medical History, Surgical history, Social history, and Family History were reviewed and updated.  Review of Systems: Review of Systems  Constitutional:  Positive for malaise/fatigue.  HENT: Negative.    Eyes: Negative.   Respiratory: Negative.    Cardiovascular: Negative.   Gastrointestinal:  Negative for nausea.  Genitourinary: Negative.   Musculoskeletal:  Positive for joint pain and myalgias.  Skin: Negative.   Neurological: Negative.   Endo/Heme/Allergies: Negative.   Psychiatric/Behavioral: Negative.       Physical Exam: Vital signs with temperature of 98.4.  Pulse 61.  Blood pressure 140/63.  Weight is 169 pounds.    Wt Readings from Last 3 Encounters:  05/06/23 170 lb (77.1 kg)  04/08/23 176 lb (79.8 kg)  03/22/23 173 lb (78.5 kg)   Physical Exam Vitals reviewed.   HENT:     Head: Normocephalic and atraumatic.  Eyes:     Pupils: Pupils are equal, round, and reactive to light.  Cardiovascular:     Rate and Rhythm: Normal rate and regular rhythm.     Heart sounds: Normal heart sounds.  Pulmonary:     Effort: Pulmonary effort is normal.     Breath sounds: Normal breath sounds.  Abdominal:     General: Bowel sounds are normal.     Palpations: Abdomen is soft.  Musculoskeletal:        General: No tenderness or deformity. Normal range of motion.     Cervical back: Normal range of motion.  Lymphadenopathy:     Cervical: No cervical adenopathy.  Skin:    General: Skin is warm and dry.     Findings: No erythema or rash.  Neurological:     Mental Status: He is alert and oriented to person, place, and time.  Psychiatric:        Behavior: Behavior normal.        Thought Content: Thought content normal.        Judgment: Judgment normal.      Lab Results  Component Value Date   WBC 4.9 05/06/2023   HGB 9.3 (L) 05/06/2023   HCT 29.8 (L) 05/06/2023   MCV 77.4 (L) 05/06/2023   PLT 186 05/06/2023   Lab Results  Component Value Date   FERRITIN 1,184 (H) 05/06/2023   IRON 84 05/06/2023   TIBC 203 (L) 05/06/2023   UIBC 119 05/06/2023   IRONPCTSAT 41 (H) 05/06/2023   Lab Results  Component Value Date   RETICCTPCT 1.4 11/12/2022   RBC 3.85 (L) 05/06/2023   RETICCTABS 27.5 03/21/2014   Lab Results  Component Value Date   KPAFRELGTCHN 73.8 (H) 05/06/2023   LAMBDASER 42.8 (H) 05/06/2023   KAPLAMBRATIO 1.72 (H) 05/06/2023   Lab Results  Component  Value Date   IGGSERUM 1,561 05/06/2023   IGA 254 05/06/2023   IGMSERUM 42 05/06/2023   Lab Results  Component Value Date   TOTALPROTELP 6.9 05/06/2023   ALBUMINELP 3.5 05/06/2023   A1GS 0.2 05/06/2023   A2GS 1.0 05/06/2023   BETS 0.8 05/06/2023   BETA2SER 0.5 11/07/2014   GAMS 1.4 05/06/2023   MSPIKE Not Observed 05/06/2023   SPEI Comment 05/06/2023     Chemistry      Component  Value Date/Time   NA 139 05/06/2023 0830   NA 143 02/22/2017 0744   K 4.2 05/06/2023 0830   K 4.0 02/22/2017 0744   CL 103 05/06/2023 0830   CL 106 02/22/2017 0744   CO2 25 05/06/2023 0830   CO2 27 02/22/2017 0744   BUN 51 (H) 05/06/2023 0830   BUN 26 (H) 02/22/2017 0744   CREATININE 3.46 (H) 05/06/2023 0830   CREATININE 2.3 (H) 02/22/2017 0744      Component Value Date/Time   CALCIUM 8.6 (L) 05/06/2023 0830   CALCIUM 9.3 02/22/2017 0744   ALKPHOS 85 05/06/2023 0830   ALKPHOS 80 02/22/2017 0744   AST 28 05/06/2023 0830   ALT 27 05/06/2023 0830   ALT 22 02/22/2017 0744   BILITOT 0.4 05/06/2023 0830        Impression and Plan: [name] is a very pleasant 73 yo African American gentleman with IgG kappa myeloma. He is off of treatment due to poor tolerance and patient preference.   Again, something just does not seem to be right.  I am not sure exactly what this is.  I will go ahead and get an CT to see what is going on.  This would not be without contrast because of his renal function.  He will get Aredia today.  He will get his Retacrit today.  Again I am not sure as to why he is anemic as he is.His iron studies showed a ferritin of 1300.  His iron saturation was 25%.  Hopefully, we will be able to get him to feel a little bit better.  His renal function is about the same.  There is no problems with his calcium.  We had to get him back probably within a month.  We need to make sure we focus on his quality of life.  Josph Macho, MD 4/10/20258:16 AM

## 2023-06-03 NOTE — Patient Instructions (Signed)
 Pamidronate Injection What is this medication? PAMIDRONATE (pa mi DROE nate) treats high calcium levels in the blood caused by cancer. It may also be used with chemotherapy to treat weakened bones caused by cancer. It can also be used to treat Paget's disease of the bone. It works by slowing down the release of calcium from bones. This lowers calcium levels in your blood. It also makes your bones stronger and less likely to break (fracture). It belongs to a group of medications called bisphosphonates. This medicine may be used for other purposes; ask your health care provider or pharmacist if you have questions. COMMON BRAND NAME(S): Aredia What should I tell my care team before I take this medication? They need to know if you have any of these conditions: Bleeding disorder Cancer Dental disease Kidney disease Low levels of calcium or other minerals in the blood Low red blood cell counts Receiving steroids, such as dexamethasone or prednisone An unusual or allergic reaction to pamidronate, other medications, foods, dyes or preservatives Pregnant or trying to get pregnant Breast-feeding How should I use this medication? This medication is injected into a vein. It is given by your care team in a hospital or clinic setting. Talk to your care team about the use of this medication in children. Special care may be needed. Overdosage: If you think you have taken too much of this medicine contact a poison control center or emergency room at once. NOTE: This medicine is only for you. Do not share this medicine with others. What if I miss a dose? Keep appointments for follow-up doses. It is important not to miss your dose. Call your care team if you are unable to keep an appointment. What may interact with this medication? Certain antibiotics given by injection Medications for inflammation or pain, such as ibuprofen, naproxen Some diuretics, such as bumetanide, furosemide Cyclosporine Parathyroid  hormone Tacrolimus Teriparatide Thalidomide This list may not describe all possible interactions. Give your health care provider a list of all the medicines, herbs, non-prescription drugs, or dietary supplements you use. Also tell them if you smoke, drink alcohol, or use illegal drugs. Some items may interact with your medicine. What should I watch for while using this medication? Visit your care team for regular checks on your progress. It may be some time before you see the benefit from this medication. Some people who take this medication have severe bone, joint, or muscle pain. This medication may also increase your risk for jaw problems or a broken thigh bone. Tell your care team right away if you have severe pain in your jaw, bones, joints, or muscles. Tell your care team if you have any pain that does not go away or that gets worse. Tell your dentist and dental surgeon that you are taking this medication. You should not have major dental surgery while on this medication. See your dentist to have a dental exam and fix any dental problems before starting this medication. Take good care of your teeth while on this medication. Make sure you see your dentist for regular follow-up appointments. You should make sure you get enough calcium and vitamin D while you are taking this medication. Discuss the foods you eat and the vitamins you take with your care team. You may need bloodwork while you are taking this medication. Talk to your care team if you wish to become pregnant or think you might be pregnant. This medication can cause serious birth defects. What side effects may I notice from receiving  this medication? Side effects that you should report to your care team as soon as possible: Allergic reactions--skin rash, itching, hives, swelling of the face, lips, tongue, or throat Kidney injury--decrease in the amount of urine, swelling of the ankles, hands, or feet Low calcium level--muscle pain or  cramps, confusion, tingling, or numbness in the hands or feet Osteonecrosis of the jaw--pain, swelling, or redness in the mouth, numbness of the jaw, poor healing after dental work, unusual discharge from the mouth, visible bones in the mouth Severe bone, joint, or muscle pain Side effects that usually do not require medical attention (report to your care team if they continue or are bothersome): Constipation Fatigue Fever Loss of appetite Nausea Pain, redness, or irritation at injection site Stomach pain This list may not describe all possible side effects. Call your doctor for medical advice about side effects. You may report side effects to FDA at 1-800-FDA-1088. Where should I keep my medication? This medication is given in a hospital or clinic. It will not be stored at home. NOTE: This sheet is a summary. It may not cover all possible information. If you have questions about this medicine, talk to your doctor, pharmacist, or health care provider.  2024 Elsevier/Gold Standard (2021-03-31 00:00:00)

## 2023-06-03 NOTE — Patient Instructions (Signed)

## 2023-06-04 ENCOUNTER — Encounter: Payer: Self-pay | Admitting: Family

## 2023-06-04 LAB — KAPPA/LAMBDA LIGHT CHAINS
Kappa free light chain: 99.1 mg/L — ABNORMAL HIGH (ref 3.3–19.4)
Kappa, lambda light chain ratio: 1.87 — ABNORMAL HIGH (ref 0.26–1.65)
Lambda free light chains: 53 mg/L — ABNORMAL HIGH (ref 5.7–26.3)

## 2023-06-06 LAB — MULTIPLE MYELOMA PANEL, SERUM
Albumin SerPl Elph-Mcnc: 3.4 g/dL (ref 2.9–4.4)
Albumin/Glob SerPl: 1 (ref 0.7–1.7)
Alpha 1: 0.2 g/dL (ref 0.0–0.4)
Alpha2 Glob SerPl Elph-Mcnc: 0.9 g/dL (ref 0.4–1.0)
B-Globulin SerPl Elph-Mcnc: 0.9 g/dL (ref 0.7–1.3)
Gamma Glob SerPl Elph-Mcnc: 1.6 g/dL (ref 0.4–1.8)
Globulin, Total: 3.6 g/dL (ref 2.2–3.9)
IgA: 282 mg/dL (ref 61–437)
IgG (Immunoglobin G), Serum: 1844 mg/dL — ABNORMAL HIGH (ref 603–1613)
IgM (Immunoglobulin M), Srm: 37 mg/dL (ref 15–143)
Total Protein ELP: 7 g/dL (ref 6.0–8.5)

## 2023-06-09 ENCOUNTER — Telehealth (HOSPITAL_BASED_OUTPATIENT_CLINIC_OR_DEPARTMENT_OTHER): Payer: Self-pay

## 2023-06-16 ENCOUNTER — Ambulatory Visit (HOSPITAL_BASED_OUTPATIENT_CLINIC_OR_DEPARTMENT_OTHER): Admission: RE | Admit: 2023-06-16 | Source: Ambulatory Visit

## 2023-06-21 ENCOUNTER — Ambulatory Visit (HOSPITAL_BASED_OUTPATIENT_CLINIC_OR_DEPARTMENT_OTHER)
Admission: RE | Admit: 2023-06-21 | Discharge: 2023-06-21 | Disposition: A | Discharge status: Home / Self Care | Source: Ambulatory Visit | Attending: Hematology & Oncology | Admitting: Hematology & Oncology

## 2023-06-21 DIAGNOSIS — M2550 Pain in unspecified joint: Secondary | ICD-10-CM | POA: Diagnosis present

## 2023-06-21 DIAGNOSIS — R634 Abnormal weight loss: Secondary | ICD-10-CM | POA: Diagnosis not present

## 2023-06-21 DIAGNOSIS — D500 Iron deficiency anemia secondary to blood loss (chronic): Secondary | ICD-10-CM | POA: Insufficient documentation

## 2023-06-21 DIAGNOSIS — R630 Anorexia: Secondary | ICD-10-CM | POA: Insufficient documentation

## 2023-06-21 DIAGNOSIS — C9000 Multiple myeloma not having achieved remission: Secondary | ICD-10-CM | POA: Insufficient documentation

## 2023-06-29 ENCOUNTER — Telehealth: Payer: Self-pay | Admitting: *Deleted

## 2023-06-29 NOTE — Telephone Encounter (Signed)
-----   Message from Ivor Mars sent at 06/28/2023  5:59 PM EDT ----- Please call and let him know that the CT scan is negative for any malignancy.  There is no cancer.  Everything looks okay from what the radiologist sees.  [name]

## 2023-06-29 NOTE — Telephone Encounter (Signed)
 As noted below by Dr. Maria Shiner, I informed the patient that the CT scan is negative for any malignancy. There is no cancer. Everything looks okay from what the radiologist sees. He verbalized understanding.

## 2023-07-01 ENCOUNTER — Encounter: Payer: Self-pay | Admitting: Hematology & Oncology

## 2023-07-01 ENCOUNTER — Inpatient Hospital Stay: Attending: Hematology & Oncology

## 2023-07-01 ENCOUNTER — Inpatient Hospital Stay (HOSPITAL_BASED_OUTPATIENT_CLINIC_OR_DEPARTMENT_OTHER): Admitting: Hematology & Oncology

## 2023-07-01 ENCOUNTER — Ambulatory Visit

## 2023-07-01 ENCOUNTER — Inpatient Hospital Stay

## 2023-07-01 ENCOUNTER — Other Ambulatory Visit

## 2023-07-01 ENCOUNTER — Other Ambulatory Visit: Payer: Self-pay

## 2023-07-01 ENCOUNTER — Ambulatory Visit: Admitting: Hematology & Oncology

## 2023-07-01 VITALS — BP 130/54 | HR 64 | Temp 97.6°F | Resp 18 | Ht 71.0 in | Wt 175.0 lb

## 2023-07-01 VITALS — BP 130/54 | HR 64 | Resp 17

## 2023-07-01 DIAGNOSIS — N184 Chronic kidney disease, stage 4 (severe): Secondary | ICD-10-CM | POA: Diagnosis present

## 2023-07-01 DIAGNOSIS — M2550 Pain in unspecified joint: Secondary | ICD-10-CM

## 2023-07-01 DIAGNOSIS — D631 Anemia in chronic kidney disease: Secondary | ICD-10-CM | POA: Diagnosis present

## 2023-07-01 DIAGNOSIS — E291 Testicular hypofunction: Secondary | ICD-10-CM | POA: Insufficient documentation

## 2023-07-01 DIAGNOSIS — C9000 Multiple myeloma not having achieved remission: Secondary | ICD-10-CM | POA: Diagnosis not present

## 2023-07-01 DIAGNOSIS — M4712 Other spondylosis with myelopathy, cervical region: Secondary | ICD-10-CM

## 2023-07-01 DIAGNOSIS — N189 Chronic kidney disease, unspecified: Secondary | ICD-10-CM

## 2023-07-01 DIAGNOSIS — Z299 Encounter for prophylactic measures, unspecified: Secondary | ICD-10-CM

## 2023-07-01 DIAGNOSIS — E349 Endocrine disorder, unspecified: Secondary | ICD-10-CM

## 2023-07-01 DIAGNOSIS — D500 Iron deficiency anemia secondary to blood loss (chronic): Secondary | ICD-10-CM

## 2023-07-01 LAB — FERRITIN: Ferritin: 1329 ng/mL — ABNORMAL HIGH (ref 24–336)

## 2023-07-01 LAB — CMP (CANCER CENTER ONLY)
ALT: 14 U/L (ref 0–44)
AST: 18 U/L (ref 15–41)
Albumin: 4 g/dL (ref 3.5–5.0)
Alkaline Phosphatase: 82 U/L (ref 38–126)
Anion gap: 9 (ref 5–15)
BUN: 58 mg/dL — ABNORMAL HIGH (ref 8–23)
CO2: 26 mmol/L (ref 22–32)
Calcium: 8.3 mg/dL — ABNORMAL LOW (ref 8.9–10.3)
Chloride: 102 mmol/L (ref 98–111)
Creatinine: 4.15 mg/dL — ABNORMAL HIGH (ref 0.61–1.24)
GFR, Estimated: 14 mL/min — ABNORMAL LOW (ref >60)
Glucose, Bld: 148 mg/dL — ABNORMAL HIGH (ref 70–99)
Potassium: 5.1 mmol/L (ref 3.5–5.1)
Sodium: 137 mmol/L (ref 135–145)
Total Bilirubin: 0.2 mg/dL (ref 0.0–1.2)
Total Protein: 7.2 g/dL (ref 6.5–8.1)

## 2023-07-01 LAB — CBC WITH DIFFERENTIAL (CANCER CENTER ONLY)
Abs Immature Granulocytes: 0.04 10*3/uL (ref 0.00–0.07)
Basophils Absolute: 0.1 10*3/uL (ref 0.0–0.1)
Basophils Relative: 1 %
Eosinophils Absolute: 0.2 10*3/uL (ref 0.0–0.5)
Eosinophils Relative: 3 %
HCT: 26.6 % — ABNORMAL LOW (ref 39.0–52.0)
Hemoglobin: 8 g/dL — ABNORMAL LOW (ref 13.0–17.0)
Immature Granulocytes: 1 %
Lymphocytes Relative: 14 %
Lymphs Abs: 1 10*3/uL (ref 0.7–4.0)
MCH: 23.1 pg — ABNORMAL LOW (ref 26.0–34.0)
MCHC: 30.1 g/dL (ref 30.0–36.0)
MCV: 76.9 fL — ABNORMAL LOW (ref 80.0–100.0)
Monocytes Absolute: 0.8 10*3/uL (ref 0.1–1.0)
Monocytes Relative: 11 %
Neutro Abs: 4.9 10*3/uL (ref 1.7–7.7)
Neutrophils Relative %: 70 %
Platelet Count: 193 10*3/uL (ref 150–400)
RBC: 3.46 MIL/uL — ABNORMAL LOW (ref 4.22–5.81)
RDW: 19.2 % — ABNORMAL HIGH (ref 11.5–15.5)
WBC Count: 6.8 10*3/uL (ref 4.0–10.5)
nRBC: 0 % (ref 0.0–0.2)

## 2023-07-01 LAB — IRON AND IRON BINDING CAPACITY (CC-WL,HP ONLY)
Iron: 42 ug/dL — ABNORMAL LOW (ref 45–182)
Saturation Ratios: 21 % (ref 17.9–39.5)
TIBC: 203 ug/dL — ABNORMAL LOW (ref 250–450)
UIBC: 161 ug/dL (ref 117–376)

## 2023-07-01 LAB — RETICULOCYTES
Immature Retic Fract: 13.7 % (ref 2.3–15.9)
RBC.: 3.47 MIL/uL — ABNORMAL LOW (ref 4.22–5.81)
Retic Count, Absolute: 17.4 10*3/uL — ABNORMAL LOW (ref 19.0–186.0)
Retic Ct Pct: 0.5 % (ref 0.4–3.1)

## 2023-07-01 LAB — LACTATE DEHYDROGENASE: LDH: 214 U/L — ABNORMAL HIGH (ref 98–192)

## 2023-07-01 MED ORDER — SODIUM CHLORIDE 0.9 % IV SOLN
300.0000 mg | Freq: Once | INTRAVENOUS | Status: AC
  Administered 2023-07-01: 300 mg via INTRAVENOUS
  Filled 2023-07-01: qty 300

## 2023-07-01 MED ORDER — SODIUM CHLORIDE 0.9% FLUSH
10.0000 mL | Freq: Once | INTRAVENOUS | Status: AC
  Administered 2023-07-01: 10 mL via INTRAVENOUS

## 2023-07-01 MED ORDER — HEPARIN SOD (PORK) LOCK FLUSH 100 UNIT/ML IV SOLN
500.0000 [IU] | Freq: Once | INTRAVENOUS | Status: AC
  Administered 2023-07-01: 500 [IU] via INTRAVENOUS

## 2023-07-01 MED ORDER — TESTOSTERONE CYPIONATE 200 MG/ML IM SOLN
400.0000 mg | Freq: Once | INTRAMUSCULAR | Status: AC
  Administered 2023-07-01: 400 mg via INTRAMUSCULAR
  Filled 2023-07-01: qty 2

## 2023-07-01 MED ORDER — SODIUM CHLORIDE 0.9 % IV SOLN: INTRAVENOUS | Status: DC

## 2023-07-01 MED ORDER — SODIUM CHLORIDE 0.9 % IV SOLN
60.0000 mg | Freq: Once | INTRAVENOUS | Status: AC
  Administered 2023-07-01: 60 mg via INTRAVENOUS
  Filled 2023-07-01: qty 10

## 2023-07-01 MED ORDER — CYANOCOBALAMIN 1000 MCG/ML IJ SOLN
1000.0000 ug | Freq: Once | INTRAMUSCULAR | Status: AC
  Administered 2023-07-01: 1000 ug via INTRAMUSCULAR
  Filled 2023-07-01: qty 1

## 2023-07-01 NOTE — Patient Instructions (Signed)
 Iron  Sucrose Injection What is this medication? IRON  SUCROSE (EYE ern SOO krose) treats low levels of iron  (iron  deficiency anemia) in people with kidney disease. Iron  is a mineral that plays an important role in making red blood cells, which carry oxygen from your lungs to the rest of your body. This medicine may be used for other purposes; ask your health care provider or pharmacist if you have questions. COMMON BRAND NAME(S): Venofer  What should I tell my care team before I take this medication? They need to know if you have any of these conditions: Anemia not caused by low iron  levels Heart disease High levels of iron  in the blood Kidney disease Liver disease An unusual or allergic reaction to iron , other medications, foods, dyes, or preservatives Pregnant or trying to get pregnant Breastfeeding How should I use this medication? This medication is infused into a vein. It is given by your care team in a hospital or clinic setting. Talk to your care team about the use of this medication in children. While it may be prescribed for children as young as 2 years for selected conditions, precautions do apply. Overdosage: If you think you have taken too much of this medicine contact a poison control center or emergency room at once. NOTE: This medicine is only for you. Do not share this medicine with others. What if I miss a dose? Keep appointments for follow-up doses. It is important not to miss your dose. Call your care team if you are unable to keep an appointment. What may interact with this medication? Do not take this medication with any of the following: Deferoxamine Dimercaprol  Other iron  products This medication may also interact with the following: Chloramphenicol Deferasirox This list may not describe all possible interactions. Give your health care provider a list of all the medicines, herbs, non-prescription drugs, or dietary supplements you use. Also tell them if you smoke,  drink alcohol , or use illegal drugs. Some items may interact with your medicine. What should I watch for while using this medication? Visit your care team for regular checks on your progress. Tell your care team if your symptoms do not start to get better or if they get worse. You may need blood work done while you are taking this medication. You may need to eat more foods that contain iron . Talk to your care team. Foods that contain iron  include whole grains or cereals, dried fruits, beans, peas, leafy green vegetables, and organ meats (liver, kidney). What side effects may I notice from receiving this medication? Side effects that you should report to your care team as soon as possible: Allergic reactions--skin rash, itching, hives, swelling of the face, lips, tongue, or throat Low blood pressure--dizziness, feeling faint or lightheaded, blurry vision Shortness of breath Side effects that usually do not require medical attention (report to your care team if they continue or are bothersome): Flushing Headache Joint pain Muscle pain Nausea Pain, redness, or irritation at injection site This list may not describe all possible side effects. Call your doctor for medical advice about side effects. You may report side effects to FDA at 1-800-FDA-1088. Where should I keep my medication? This medication is given in a hospital or clinic. It will not be stored at home. NOTE: This sheet is a summary. It may not cover all possible information. If you have questions about this medicine, talk to your doctor, pharmacist, or health care provider.  2024 Elsevier/Gold Standard (2022-09-30 00:00:00)  Testosterone  Injection What is this medication? TESTOSTERONE  (  tes TOS ter one) is used to increase testosterone  levels in your body. It belongs to a group of medications called androgen hormones. This medicine may be used for other purposes; ask your health care provider or pharmacist if you have  questions. COMMON BRAND NAME(S): Andro-L.A., Aveed, Azmiro, Delatestryl, Depo-Testosterone , Testone CIK , Virilon What should I tell my care team before I take this medication? They need to know if you have any of these conditions: Cancer Diabetes Heart disease Kidney disease Liver disease Lung disease Prostate disease An unusual or allergic reaction to testosterone , other medications, foods, dyes, or preservatives If you or your partner are pregnant or trying to get pregnant Breastfeeding How should I use this medication? This medication is injected into a muscle. It is given by your care team in a hospital or clinic setting. A special MedGuide will be given to you by the pharmacist with each prescription and refill. Be sure to read this information carefully each time. Contact your care team about the use of this medication in children. While it be prescribed for children as young as 12 years for selected conditions, precautions do apply. Overdosage: If you think you have taken too much of this medicine contact a poison control center or emergency room at once. NOTE: This medicine is only for you. Do not share this medicine with others. What if I miss a dose? Try not to miss a dose. Your care team will tell you when your next injection is due. Notify the office if you are unable to keep an appointment. What may interact with this medication? Medications for diabetes Medications that treat or prevent blood clots like warfarin Oxyphenbutazone Propranolol Steroid medications like prednisone or cortisone This list may not describe all possible interactions. Give your health care provider a list of all the medicines, herbs, non-prescription drugs, or dietary supplements you use. Also tell them if you smoke, drink alcohol , or use illegal drugs. Some items may interact with your medicine. What should I watch for while using this medication? Visit your care team for regular checks on your  progress. They will need to check the level of testosterone  in your blood. Heart attacks and strokes have been reported with the use of this medication. Get emergency help if you develop signs or symptoms of a heart attack or stroke. Talk to your care team about the risks and benefits of this medication. This medication may affect blood sugar levels. If you have diabetes, check with your care team before you change your diet or the dose of your diabetic medication. Talk to your care team if you wish to become pregnant or think you might be pregnant. This medication can cause serious birth defects if taken during pregnancy. This medication is banned from use in athletes by most athletic organizations. What side effects may I notice from receiving this medication? Side effects that you should report to your care team as soon as possible: Allergic reactions--skin rash, itching, hives, swelling of the face, lips, tongue, or throat Blood clot--pain, swelling, or warmth in the leg, shortness of breath, chest pain Heart attack--pain or tightness in the chest, shoulders, arms or jaw, nausea, shortness of breath, cold or clammy skin, feeling faint or lightheaded Increase in blood pressure Liver injury--right upper belly pain, loss of appetite, nausea, light-colored stool, dark yellow or brown urine, yellowing of the skin or eyes, unusual weakness or fatigue Mood swings, irritability, or hostility Prolonged or painful erection Sleep apnea--loud snoring, gasping during sleep, daytime sleepiness Stroke--sudden  numbness or weakness of the face, arm or leg, trouble speaking, confusion, trouble walking, loss of balance or coordination, dizziness, severe headache, change in vision Swelling of the ankles, hands, or feet Thoughts of suicide or self-harm, worsening mood, feelings of depression Side effects that usually do not require medical attention (report to your care team if they continue or are  bothersome): Acne Change in sex drive or performance Pain, redness, or irritation at the application site Unexpected breast tissue growth This list may not describe all possible side effects. Call your doctor for medical advice about side effects. You may report side effects to FDA at 1-800-FDA-1088. Where should I keep my medication? Keep out of the reach of children. This medication can be abused. Keep your medication in a safe place to protect it from theft. Do not share this medication with anyone. Selling or giving away this medication is dangerous and against the law. Store at room temperature between 20 and 25 degrees C (68 and 77 degrees F). Do not freeze. Protect from light. Follow the directions for the product you are prescribed. Throw away any unused medication after the expiration date. NOTE: This sheet is a summary. It may not cover all possible information. If you have questions about this medicine, talk to your doctor, pharmacist, or health care provider.  2024 Elsevier/Gold Standard (2021-12-16 00:00:00)

## 2023-07-01 NOTE — Patient Instructions (Signed)

## 2023-07-01 NOTE — Progress Notes (Signed)
 Per Dr. Maria Shiner ok to proceed with Aredia  with calcium 8.3 and albumin 4

## 2023-07-01 NOTE — Progress Notes (Signed)
 Hematology and Oncology Follow Up Visit  [name] [id number] [birth year] 73 y.o. 07/01/2023   Principle Diagnosis:  IgG kappa myeloma Anemia secondary to renal insufficiency Intermittent iron  - deficiency anemia Hypotestosteronemia   Prior Therapy:        Kyprolis /Cytoxan  - s/p cycle #40 --DC on 12/24/2022  Current Therapy:  Aredia  60 mg IV q  month -- Started on 07/2022 Retacrit  SQ as needed for hemoglobin less than 10 DepoTestosterone 400 mg q 4 weeks IV iron  as indicated   Interim History:  [name] is here today for follow-up.  His hemoglobin is quite low right now.  No wonder, he feels little bit tired.  His hemoglobin is 8.  Again I am not sure as to why it is so low.  Unfortunately, one of the issues might be that his renal function is worse.  Again this is all from his diabetes.  We did do a CT scan on him.  This was done on 06/21/2023.  The CT scan did not show any evidence of malignancy.  There were no osseous bony lesions from myeloma.  His iron  studies today showed a ferritin of 1329 with an iron  saturation of 21%.  I think he probably will benefit from IV iron .    Past Medical History:  Diagnosis Date   Anemia    Anemia of renal disease 03/12/2011   Anemia, iron  deficiency 03/12/2011   Anxiety    Anxiety disorder    Arthralgia of multiple joints 12/31/2010   Arthritis    Constipation    Diabetes mellitus without complication (HCC)    Diabetic neuropathy (HCC)    Diverticulosis    Epidermoid cyst of skin of chest 08/29/2018   Fibromyalgia    GERD (gastroesophageal reflux disease)    Headache    Hemorrhoids    Herpes zoster    History of COVID-19 04/03/2019   Hyperlipidemia    Hypertension    Hypotestosteronism 03/17/2012   Multiple myeloma    Multiple myeloma (HCC)    Myeloma (HCC) 12/31/2010   Nevus of left elbow 08/29/2018   Pneumonia    Renal insufficiency    Type 2 diabetes mellitus (HCC)    Urinary retention   :   Past Surgical History:   Procedure Laterality Date   BALLOON DILATION N/A 05/16/2021   Procedure: BALLOON DILATION;  Surgeon: Lajuan Pila, MD;  Location: [facility] ENDOSCOPY;  Service: Gastroenterology;  Laterality: N/A;   BIOPSY  05/16/2021   Procedure: BIOPSY;  Surgeon: Lajuan Pila, MD;  Location: [facility] ENDOSCOPY;  Service: Gastroenterology;;   CATARACT EXTRACTION     bi-lateral   COLONOSCOPY     COLONOSCOPY  07/13/2011   Procedure: COLONOSCOPY;  Surgeon: Asencion Blacksmith, MD,FACG;  Location: [facility] ENDOSCOPY;  Service: Endoscopy;  Laterality: N/A;   CYST EXCISION Right 08/29/2018   Procedure: CYST REMOVAL;  Surgeon: Boyce Byes, MD;  Location: [facility];  Service: General;  Laterality: Right;   ESOPHAGOGASTRODUODENOSCOPY     ESOPHAGOGASTRODUODENOSCOPY (EGD) WITH PROPOFOL  N/A 05/16/2021   Procedure: ESOPHAGOGASTRODUODENOSCOPY (EGD) WITH PROPOFOL ;  Surgeon: Lajuan Pila, MD;  Location: [facility] ENDOSCOPY;  Service: Gastroenterology;  Laterality: N/A;   IR CHEST FLUORO  09/22/2016   IR CHEST FLUORO  04/25/2020   IR FLUORO GUIDE PORT INSERTION RIGHT  09/23/2016   IR REMOVAL TUN ACCESS W/ PORT W/O FL MOD SED  09/23/2016   IR US  GUIDE VASC ACCESS LEFT  09/23/2016   KNEE ARTHROSCOPY     left   KNEE  SURGERY     MASS EXCISION Right 05/09/2020   Procedure: RIGHT LONG FINGER EXCISION MASS,;  Surgeon: Brunilda Capra, MD;  Location: [facility];  Service: Orthopedics;  Laterality: Right;   NEVUS EXCISION N/A 08/29/2018   Procedure: , NEVUS LEFT ELBOW;  Surgeon: Boyce Byes, MD;  Location: [facility];  Service: General;  Laterality: N/A;   POLYPECTOMY  05/16/2021   Procedure: POLYPECTOMY;  Surgeon: Lajuan Pila, MD;  Location: [facility] ENDOSCOPY;  Service: Gastroenterology;;   PORTACATH PLACEMENT     POSTERIOR CERVICAL FUSION/FORAMINOTOMY N/A 06/05/2016   Procedure: LAMINECTOMY CERVICAL 3 - CERVICAL 6 WITH LATERAL MASS FUSION;  Surgeon: Augusto Blonder, MD;  Location: [facility] OR;  Service: Neurosurgery;   Laterality: N/A;  LAMINECTOMY CERVICAL 3 - CERVICAL 6 WITH LATERAL MASS FUSION   TONSILLECTOMY     TRANSURETHRAL RESECTION OF PROSTATE  01/2011   Dr. at [facility] in [place]  :   Current Outpatient Medications:    amLODipine  (NORVASC ) 10 MG tablet, Take 10 mg by mouth daily., Disp: , Rfl:    ammonium lactate  (AMLACTIN) 12 % lotion, Apply 1 application topically as needed for dry skin. (Patient not taking: Reported on 01/08/2023), Disp: 400 g, Rfl: 0   atorvastatin  (LIPITOR) 40 MG tablet, Take 40 mg by mouth daily at 6 PM. , Disp: , Rfl:    budesonide-formoterol (SYMBICORT) 160-4.5 MCG/ACT inhaler, SMARTSIG:2 Puff(s) By Mouth Twice Daily, Disp: , Rfl:    CARAFATE  1 GM/10ML suspension, Take 1 g by mouth 4 (four) times daily. , Disp: , Rfl:    carvedilol (COREG) 25 MG tablet, Take 25 mg by mouth 2 (two) times daily., Disp: , Rfl:    chlorpheniramine-HYDROcodone  10-8 MG/5ML, SMARTSIG:5 Milliliter(s) By Mouth Every 12 Hours, Disp: , Rfl:    [employer] Fiber Gummies 2 g CHEW, Chew by mouth daily., Disp: , Rfl:    cyclobenzaprine (FLEXERIL) 10 MG tablet, Take 10 mg by mouth 3 (three) times daily as needed., Disp: , Rfl:    diazepam  (VALIUM ) 5 MG tablet, Take by mouth at bedtime as needed., Disp: , Rfl:    diclofenac Sodium (VOLTAREN) 1 % GEL, Apply topically., Disp: , Rfl:    dicyclomine  (BENTYL ) 10 MG capsule, TAKE 1 CAPSULE BY MOUTH 3 TIMES A DAY BEFORE MEALS, Disp: 270 capsule, Rfl: 1   dronabinol  (MARINOL ) 5 MG capsule, Take 1 capsule (5 mg total) by mouth 2 (two) times daily before a meal., Disp: 60 capsule, Rfl: 1   gabapentin  (NEURONTIN ) 300 MG capsule, TAKE 1 CAPSULE BY MOUTH THREE TIMES A DAY (Patient taking differently: Take 300 mg by mouth 3 (three) times daily.), Disp: 270 capsule, Rfl: 1   glimepiride  (AMARYL ) 2 MG tablet, Take 2 mg by mouth daily., Disp: , Rfl:    hydrocortisone  2.5 % cream, Apply topically 2 (two) times daily., Disp: , Rfl:    JANUVIA 100 MG tablet, Take 100 mg by  mouth daily., Disp: , Rfl:    KLOR-CON  M20 20 MEQ tablet, Take 20 mEq by mouth daily., Disp: , Rfl:    lactulose  (CHRONULAC ) 10 GM/15ML solution, Take 30 mLs (20 g total) by mouth daily., Disp: 946 mL, Rfl: 1   latanoprost  (XALATAN ) 0.005 % ophthalmic solution, Place 1 drop into both eyes at bedtime., Disp: , Rfl:    linaclotide  (LINZESS ) 145 MCG CAPS capsule, Take by mouth daily as needed., Disp: , Rfl:    losartan  (COZAAR ) 50 MG tablet, Take 50 mg by mouth daily., Disp: ,  Rfl:    LUMIGAN 0.01 % SOLN, Place 1 drop into both eyes 3 (three) times daily., Disp: , Rfl:    mesalamine  (LIALDA ) 1.2 g EC tablet, Take by mouth daily with breakfast., Disp: , Rfl:    methocarbamol  (ROBAXIN ) 500 MG tablet, Take 1 tablet (500 mg total) by mouth 2 (two) times daily., Disp: 20 tablet, Rfl: 0   metoprolol  succinate (TOPROL -XL) 25 MG 24 hr tablet, Take 25 mg by mouth daily., Disp: , Rfl:    mirabegron  ER (MYRBETRIQ ) 25 MG TB24 tablet, Take 20 mg by mouth daily., Disp: , Rfl:    morphine  (MS CONTIN ) 30 MG 12 hr tablet, Take 30 mg by mouth 2 (two) times daily., Disp: , Rfl:    omeprazole  (PRILOSEC) 40 MG capsule, TAKE 1 CAPSULE BY MOUTH TWICE A DAY, Disp: 180 capsule, Rfl: 0   oxyCODONE  (ROXICODONE ) 5 MG immediate release tablet, 1 tab PO q6 hours prn pain, Disp: 15 tablet, Rfl: 0   oxyCODONE  ER (XTAMPZA  ER) 13.5 MG C12A, daily., Disp: , Rfl:    oxyCODONE -acetaminophen  (PERCOCET) 10-325 MG tablet, Take 1 tablet by mouth every 6 (six) hours as needed for pain., Disp: , Rfl:    pioglitazone  (ACTOS ) 30 MG tablet, Take 30 mg by mouth daily., Disp: , Rfl:    polyethylene glycol powder (GLYCOLAX /MIRALAX ) 17 GM/SCOOP powder, SMARTSIG:1 scoopful By Mouth Twice Daily PRN, Disp: , Rfl:    prochlorperazine  (COMPAZINE ) 10 MG tablet, take 1 tablet by mouth every 6 hours if needed for nausea and vomiting, Disp: 30 tablet, Rfl: 1   RESTASIS  0.05 % ophthalmic emulsion, Place 1 drop into both eyes daily. , Disp: , Rfl:     sildenafil (VIAGRA) 100 MG tablet, , Disp: , Rfl:    SUMAtriptan (IMITREX) 100 MG tablet, Take 100 mg by mouth daily., Disp: , Rfl:    topiramate  (TOPAMAX ) 25 MG capsule, Take 25 mg by mouth 2 (two) times daily., Disp: , Rfl:    topiramate  (TOPAMAX ) 50 MG tablet, Take 50 mg by mouth at bedtime., Disp: , Rfl:    torsemide  (DEMADEX ) 20 MG tablet, Take 20 mg by mouth daily., Disp: , Rfl:    Vitamin D , Ergocalciferol , (DRISDOL ) 50000 UNITS CAPS capsule, Take 50,000 Units by mouth every Saturday. , Disp: , Rfl:  No current facility-administered medications for this visit.  Facility-Administered Medications Ordered in Other Visits:    0.9 %  sodium chloride  infusion, , Intravenous, Continuous, Tish Forge, NP, Stopped at 10/20/19 1037   0.9 %  sodium chloride  infusion, , Intravenous, Continuous, Vira Chaplin, Sherryll Donald, MD, Stopped at 07/01/23 1518   furosemide  (LASIX ) injection 40 mg, 40 mg, Intravenous, Once, Janira Mandell, Sherryll Donald, MD   HYDROmorphone  (DILAUDID ) 1 MG/ML injection, , , ,    morphine  4 MG/ML injection 4 mg, 4 mg, Intravenous, Q4H PRN, Ivor Mars, MD   sodium chloride  flush (NS) 0.9 % injection 10 mL, 10 mL, Intravenous, PRN, Tish Forge, NP, 10 mL at 10/20/19 1042:  :   Allergies  Allergen Reactions   Iodinated Contrast Media Rash    Patient states he was instructed not to take IV contrast.  In 2008 he had an unknown reaction, and was told not to take it again.  He was also told not to take it due to his kidneys.   Ioversol    Prednisone    Iodine Anxiety, Rash and Other (See Comments)    Didn't feel right "instructed not to take per MD--something with  his port"   :   Family History  Problem Relation Age of Onset   Breast cancer Sister    Breast cancer Mother    Heart disease Mother    Diabetes Sister    Bone cancer Father    Heart disease Sister    Heart disease Daughter    Throat cancer Brother    Stomach cancer Brother    Colon cancer Neg Hx   :   Social  History   Socioeconomic History   Marital status: Divorced    Spouse name: Not on file   Number of children: 3   Years of education: Not on file   Highest education level: Not on file  Occupational History   Occupation: retired    Associate Professor: [employer]  Tobacco Use   Smoking status: Never   Smokeless tobacco: Never  Vaping Use   Vaping status: Never Used  Substance and Sexual Activity   Alcohol  use: Not Currently    Alcohol /week: 0.0 standard drinks of alcohol    Drug use: Not Currently    Comment: nothing in twenty years   Sexual activity: Yes  Other Topics Concern   Not on file  Social History Narrative   Lives alone in a one story home. Has 3 children.  Retired Financial risk analyst.  Education: 12 grade.       Social Drivers of Corporate investment banker Strain: Not on file  Food Insecurity: Not on file  Transportation Needs: Not on file  Physical Activity: Not on file  Stress: Not on file  Social Connections: Not on file  Intimate Partner Violence: Not on file  :  Pertinent items are noted in HPI.  Exam: @IPVITALS @ Physical Exam Vitals reviewed.  HENT:     Head: Normocephalic and atraumatic.  Eyes:     Pupils: Pupils are equal, round, and reactive to light.  Cardiovascular:     Rate and Rhythm: Normal rate and regular rhythm.     Heart sounds: Normal heart sounds.  Pulmonary:     Effort: Pulmonary effort is normal.     Breath sounds: Normal breath sounds.  Abdominal:     General: Bowel sounds are normal.     Palpations: Abdomen is soft.  Musculoskeletal:        General: No tenderness or deformity. Normal range of motion.     Cervical back: Normal range of motion.  Lymphadenopathy:     Cervical: No cervical adenopathy.  Skin:    General: Skin is warm and dry.     Findings: No erythema or rash.  Neurological:     Mental Status: He is alert and oriented to person, place, and time.  Psychiatric:        Behavior: Behavior normal.        Thought Content: Thought  content normal.        Judgment: Judgment normal.       Recent Labs    07/01/23 0739  WBC 6.8  HGB 8.0*  HCT 26.6*  PLT 193    Recent Labs    07/01/23 0739  NA 137  K 5.1  CL 102  CO2 26  GLUCOSE 148*  BUN 58*  CREATININE 4.15*  CALCIUM 8.3*      Lab Results  Component Value Date   IGGSERUM 1,844 (H) 06/03/2023   IGA 282 06/03/2023   IGMSERUM 37 06/03/2023   Lab Results  Component Value Date   TOTALPROTELP 7.0 06/03/2023   ALBUMINELP 3.5 05/06/2023  A1GS 0.2 05/06/2023   A2GS 1.0 05/06/2023   BETS 0.8 05/06/2023   BETA2SER 0.5 11/07/2014   GAMS 1.4 05/06/2023   MSPIKE Not Observed 05/06/2023   SPEI Comment 05/06/2023     Chemistry      Component Value Date/Time   NA 137 07/01/2023 0739   NA 143 02/22/2017 0744   K 5.1 07/01/2023 0739   K 4.0 02/22/2017 0744   CL 102 07/01/2023 0739   CL 106 02/22/2017 0744   CO2 26 07/01/2023 0739   CO2 27 02/22/2017 0744   BUN 58 (H) 07/01/2023 0739   BUN 26 (H) 02/22/2017 0744   CREATININE 4.15 (H) 07/01/2023 0739   CREATININE 2.3 (H) 02/22/2017 0744      Component Value Date/Time   CALCIUM 8.3 (L) 07/01/2023 0739   CALCIUM 9.3 02/22/2017 0744   ALKPHOS 82 07/01/2023 0739   ALKPHOS 80 02/22/2017 0744   AST 18 07/01/2023 0739   ALT 14 07/01/2023 0739   ALT 22 02/22/2017 0744   BILITOT 0.2 07/01/2023 0739        Impression and Plan: [name] is a very pleasant 73 yo African American gentleman with IgG kappa myeloma. He is off of treatment due to poor tolerance and patient preference.   Again, his hemoglobin is quite low.  We will go ahead and give him iron  today.  I will give him some Retacrit  tomorrow.  He also probably needs to get testosterone .  He has been off treatment for the myeloma.  This is not been a problem for him.  The myeloma levels have not changed at all.  I do not think he needs a bone marrow biopsy.  Will plan to have him come back weekly for the Retacrit  in order to get his  hemoglobin up.  I think this will make him feel better.  As always, he gets his Aredia .  I will see him back myself in a month.  Ivor Mars, MD 5/8/20256:03 PM

## 2023-07-02 LAB — KAPPA/LAMBDA LIGHT CHAINS
Kappa free light chain: 84.3 mg/L — ABNORMAL HIGH (ref 3.3–19.4)
Kappa, lambda light chain ratio: 1.67 — ABNORMAL HIGH (ref 0.26–1.65)
Lambda free light chains: 50.4 mg/L — ABNORMAL HIGH (ref 5.7–26.3)

## 2023-07-02 LAB — IGG, IGA, IGM
IgA: 238 mg/dL (ref 61–437)
IgG (Immunoglobin G), Serum: 1570 mg/dL (ref 603–1613)
IgM (Immunoglobulin M), Srm: 38 mg/dL (ref 15–143)

## 2023-07-02 LAB — TESTOSTERONE: Testosterone: 122 ng/dL — ABNORMAL LOW (ref 264–916)

## 2023-07-06 LAB — PROTEIN ELECTROPHORESIS, SERUM, WITH REFLEX
A/G Ratio: 1 (ref 0.7–1.7)
Albumin ELP: 3.3 g/dL (ref 2.9–4.4)
Alpha-1-Globulin: 0.2 g/dL (ref 0.0–0.4)
Alpha-2-Globulin: 0.9 g/dL (ref 0.4–1.0)
Beta Globulin: 0.8 g/dL (ref 0.7–1.3)
Gamma Globulin: 1.4 g/dL (ref 0.4–1.8)
Globulin, Total: 3.4 g/dL (ref 2.2–3.9)
Total Protein ELP: 6.7 g/dL (ref 6.0–8.5)

## 2023-07-08 ENCOUNTER — Inpatient Hospital Stay

## 2023-07-08 ENCOUNTER — Other Ambulatory Visit: Payer: Self-pay | Admitting: Oncology

## 2023-07-08 VITALS — BP 136/69 | HR 79 | Temp 97.7°F | Resp 18

## 2023-07-08 VITALS — BP 145/62 | HR 71

## 2023-07-08 DIAGNOSIS — C9000 Multiple myeloma not having achieved remission: Secondary | ICD-10-CM

## 2023-07-08 DIAGNOSIS — D631 Anemia in chronic kidney disease: Secondary | ICD-10-CM

## 2023-07-08 DIAGNOSIS — C9001 Multiple myeloma in remission: Secondary | ICD-10-CM

## 2023-07-08 DIAGNOSIS — E349 Endocrine disorder, unspecified: Secondary | ICD-10-CM

## 2023-07-08 DIAGNOSIS — Z299 Encounter for prophylactic measures, unspecified: Secondary | ICD-10-CM

## 2023-07-08 DIAGNOSIS — N184 Chronic kidney disease, stage 4 (severe): Secondary | ICD-10-CM | POA: Diagnosis not present

## 2023-07-08 LAB — CBC WITH DIFFERENTIAL (CANCER CENTER ONLY)
Abs Immature Granulocytes: 0.03 10*3/uL (ref 0.00–0.07)
Basophils Absolute: 0.1 10*3/uL (ref 0.0–0.1)
Basophils Relative: 1 %
Eosinophils Absolute: 0.2 10*3/uL (ref 0.0–0.5)
Eosinophils Relative: 3 %
HCT: 28.5 % — ABNORMAL LOW (ref 39.0–52.0)
Hemoglobin: 8.8 g/dL — ABNORMAL LOW (ref 13.0–17.0)
Immature Granulocytes: 1 %
Lymphocytes Relative: 15 %
Lymphs Abs: 0.9 10*3/uL (ref 0.7–4.0)
MCH: 23.5 pg — ABNORMAL LOW (ref 26.0–34.0)
MCHC: 30.9 g/dL (ref 30.0–36.0)
MCV: 76 fL — ABNORMAL LOW (ref 80.0–100.0)
Monocytes Absolute: 0.9 10*3/uL (ref 0.1–1.0)
Monocytes Relative: 15 %
Neutro Abs: 3.9 10*3/uL (ref 1.7–7.7)
Neutrophils Relative %: 65 %
Platelet Count: 228 10*3/uL (ref 150–400)
RBC: 3.75 MIL/uL — ABNORMAL LOW (ref 4.22–5.81)
RDW: 19.4 % — ABNORMAL HIGH (ref 11.5–15.5)
WBC Count: 5.9 10*3/uL (ref 4.0–10.5)
nRBC: 0 % (ref 0.0–0.2)

## 2023-07-08 LAB — CMP (CANCER CENTER ONLY)
ALT: 13 U/L (ref 0–44)
AST: 18 U/L (ref 15–41)
Albumin: 4 g/dL (ref 3.5–5.0)
Alkaline Phosphatase: 78 U/L (ref 38–126)
Anion gap: 9 (ref 5–15)
BUN: 39 mg/dL — ABNORMAL HIGH (ref 8–23)
CO2: 27 mmol/L (ref 22–32)
Calcium: 8.4 mg/dL — ABNORMAL LOW (ref 8.9–10.3)
Chloride: 103 mmol/L (ref 98–111)
Creatinine: 3.28 mg/dL — ABNORMAL HIGH (ref 0.61–1.24)
GFR, Estimated: 19 mL/min — ABNORMAL LOW (ref >60)
Glucose, Bld: 106 mg/dL — ABNORMAL HIGH (ref 70–99)
Potassium: 3.9 mmol/L (ref 3.5–5.1)
Sodium: 139 mmol/L (ref 135–145)
Total Bilirubin: 0.4 mg/dL (ref 0.0–1.2)
Total Protein: 7.2 g/dL (ref 6.5–8.1)

## 2023-07-08 MED ORDER — SODIUM CHLORIDE 0.9% FLUSH
10.0000 mL | Freq: Once | INTRAVENOUS | Status: AC
  Administered 2023-07-08: 10 mL

## 2023-07-08 MED ORDER — HEPARIN SOD (PORK) LOCK FLUSH 100 UNIT/ML IV SOLN
500.0000 [IU] | Freq: Once | INTRAVENOUS | Status: AC
  Administered 2023-07-08: 500 [IU] via INTRAVENOUS

## 2023-07-08 MED ORDER — EPOETIN ALFA-EPBX 40000 UNIT/ML IJ SOLN
40000.0000 [IU] | Freq: Once | INTRAMUSCULAR | Status: AC
  Administered 2023-07-08: 40000 [IU] via SUBCUTANEOUS
  Filled 2023-07-08: qty 1

## 2023-07-08 MED ORDER — HYDROMORPHONE HCL 1 MG/ML IJ SOLN
2.0000 mg | Freq: Once | INTRAMUSCULAR | Status: AC
  Administered 2023-07-08: 2 mg via INTRAVENOUS
  Filled 2023-07-08: qty 2

## 2023-07-08 NOTE — Progress Notes (Signed)
 Patient does not want to stay for observation after pain medication administration. Patient VSS. Patient discharged ambulatory without complaints or concerns.

## 2023-07-08 NOTE — Patient Instructions (Signed)

## 2023-07-08 NOTE — Patient Instructions (Signed)

## 2023-07-15 ENCOUNTER — Inpatient Hospital Stay

## 2023-07-15 VITALS — BP 151/70 | HR 79 | Temp 98.2°F

## 2023-07-15 DIAGNOSIS — D631 Anemia in chronic kidney disease: Secondary | ICD-10-CM

## 2023-07-15 DIAGNOSIS — Z299 Encounter for prophylactic measures, unspecified: Secondary | ICD-10-CM

## 2023-07-15 DIAGNOSIS — E349 Endocrine disorder, unspecified: Secondary | ICD-10-CM

## 2023-07-15 DIAGNOSIS — N184 Chronic kidney disease, stage 4 (severe): Secondary | ICD-10-CM | POA: Diagnosis not present

## 2023-07-15 MED ORDER — EPOETIN ALFA-EPBX 40000 UNIT/ML IJ SOLN
40000.0000 [IU] | Freq: Once | INTRAMUSCULAR | Status: AC
  Administered 2023-07-15: 40000 [IU] via SUBCUTANEOUS
  Filled 2023-07-15: qty 1

## 2023-07-15 NOTE — Patient Instructions (Signed)

## 2023-07-15 NOTE — Progress Notes (Signed)
 Ok to give Retacrit  today with CBC results from 5/15.  Jobe Mulder [place], [place], BCPS, BCOP 07/15/2023 11:37 AM

## 2023-07-22 ENCOUNTER — Inpatient Hospital Stay

## 2023-07-22 VITALS — BP 129/55 | HR 60 | Temp 98.2°F

## 2023-07-22 DIAGNOSIS — N189 Chronic kidney disease, unspecified: Secondary | ICD-10-CM

## 2023-07-22 DIAGNOSIS — Z299 Encounter for prophylactic measures, unspecified: Secondary | ICD-10-CM

## 2023-07-22 DIAGNOSIS — N184 Chronic kidney disease, stage 4 (severe): Secondary | ICD-10-CM | POA: Diagnosis not present

## 2023-07-22 DIAGNOSIS — E349 Endocrine disorder, unspecified: Secondary | ICD-10-CM

## 2023-07-22 LAB — CMP (CANCER CENTER ONLY)
ALT: 12 U/L (ref 0–44)
AST: 16 U/L (ref 15–41)
Albumin: 4 g/dL (ref 3.5–5.0)
Alkaline Phosphatase: 100 U/L (ref 38–126)
Anion gap: 7 (ref 5–15)
BUN: 39 mg/dL — ABNORMAL HIGH (ref 8–23)
CO2: 27 mmol/L (ref 22–32)
Calcium: 8.7 mg/dL — ABNORMAL LOW (ref 8.9–10.3)
Chloride: 105 mmol/L (ref 98–111)
Creatinine: 3.45 mg/dL — ABNORMAL HIGH (ref 0.61–1.24)
GFR, Estimated: 18 mL/min — ABNORMAL LOW (ref >60)
Glucose, Bld: 93 mg/dL (ref 70–99)
Potassium: 4.7 mmol/L (ref 3.5–5.1)
Sodium: 139 mmol/L (ref 135–145)
Total Bilirubin: 0.4 mg/dL (ref 0.0–1.2)
Total Protein: 7 g/dL (ref 6.5–8.1)

## 2023-07-22 LAB — CBC WITH DIFFERENTIAL (CANCER CENTER ONLY)
Abs Immature Granulocytes: 0.04 10*3/uL (ref 0.00–0.07)
Basophils Absolute: 0.1 10*3/uL (ref 0.0–0.1)
Basophils Relative: 1 %
Eosinophils Absolute: 0.2 10*3/uL (ref 0.0–0.5)
Eosinophils Relative: 3 %
HCT: 29.5 % — ABNORMAL LOW (ref 39.0–52.0)
Hemoglobin: 8.9 g/dL — ABNORMAL LOW (ref 13.0–17.0)
Immature Granulocytes: 1 %
Lymphocytes Relative: 14 %
Lymphs Abs: 1 10*3/uL (ref 0.7–4.0)
MCH: 23.6 pg — ABNORMAL LOW (ref 26.0–34.0)
MCHC: 30.2 g/dL (ref 30.0–36.0)
MCV: 78.2 fL — ABNORMAL LOW (ref 80.0–100.0)
Monocytes Absolute: 1.1 10*3/uL — ABNORMAL HIGH (ref 0.1–1.0)
Monocytes Relative: 17 %
Neutro Abs: 4.5 10*3/uL (ref 1.7–7.7)
Neutrophils Relative %: 64 %
Platelet Count: 261 10*3/uL (ref 150–400)
RBC: 3.77 MIL/uL — ABNORMAL LOW (ref 4.22–5.81)
RDW: 19.7 % — ABNORMAL HIGH (ref 11.5–15.5)
WBC Count: 6.9 10*3/uL (ref 4.0–10.5)
nRBC: 0 % (ref 0.0–0.2)

## 2023-07-22 MED ORDER — EPOETIN ALFA-EPBX 40000 UNIT/ML IJ SOLN
40000.0000 [IU] | Freq: Once | INTRAMUSCULAR | Status: AC
  Administered 2023-07-22: 40000 [IU] via SUBCUTANEOUS
  Filled 2023-07-22: qty 1

## 2023-07-22 MED ORDER — SODIUM CHLORIDE 0.9% FLUSH
10.0000 mL | INTRAVENOUS | Status: DC | PRN
  Administered 2023-07-22: 10 mL via INTRAVENOUS

## 2023-07-22 MED ORDER — HEPARIN SOD (PORK) LOCK FLUSH 100 UNIT/ML IV SOLN
500.0000 [IU] | Freq: Once | INTRAVENOUS | Status: AC
  Administered 2023-07-22: 500 [IU] via INTRAVENOUS

## 2023-07-22 NOTE — Patient Instructions (Signed)
 Darbepoetin Alfa  Injection What is this medication? DARBEPOETIN ALFA  (dar be POE e tin AL fa) treats low levels of red blood cells (anemia) caused by kidney disease or chemotherapy. It works by Systems analyst make more red blood cells, which reduces the need for blood transfusions. This medicine may be used for other purposes; ask your health care provider or pharmacist if you have questions. COMMON BRAND NAME(S): Aranesp  What should I tell my care team before I take this medication? They need to know if you have any of these conditions: Blood clots Cancer Heart disease High blood pressure On dialysis Seizures Stroke An unusual or allergic reaction to darbepoetin, latex, other medications, foods, dyes, or preservatives Pregnant or trying to get pregnant Breast-feeding How should I use this medication? This medication is injected into a vein or under the skin. It is usually given by a care team in a hospital or clinic setting. It may also be given at home. If you get this medication at home, you will be taught how to prepare and give it. Use exactly as directed. Take it as directed on the prescription label at the same time every day. Keep taking it unless your care team tells you to stop. It is important that you put your used needles and syringes in a special sharps container. Do not put them in a trash can. If you do not have a sharps container, call your pharmacist or care team to get one. A special MedGuide will be given to you by the pharmacist with each prescription and refill. Be sure to read this information carefully each time. Talk to your care team about the use of this medication in children. While this medication may be used in children as young as 1 month of age for selected conditions, precautions do apply. Overdosage: If you think you have taken too much of this medicine contact a poison control center or emergency room at once. NOTE: This medicine is only for you. Do not  share this medicine with others. What if I miss a dose? If you miss a dose, take it as soon as you can. If it is almost time for your next dose, take only that dose. Do not take double or extra doses. What may interact with this medication? Epoetin  alfa Methoxy polyethylene glycol-epoetin  beta This list may not describe all possible interactions. Give your health care provider a list of all the medicines, herbs, non-prescription drugs, or dietary supplements you use. Also tell them if you smoke, drink alcohol , or use illegal drugs. Some items may interact with your medicine. What should I watch for while using this medication? Visit your care team for regular checks on your progress. Check your blood pressure as directed. Know what your blood pressure should be and when to contact your care team. Your condition will be monitored carefully while you are receiving this medication. You may need blood work while taking this medication. What side effects may I notice from receiving this medication? Side effects that you should report to your care team as soon as possible: Allergic reactions--skin rash, itching, hives, swelling of the face, lips, tongue, or throat Blood clot--pain, swelling, or warmth in the leg, shortness of breath, chest pain Heart attack--pain or tightness in the chest, shoulders, arms, or jaw, nausea, shortness of breath, cold or clammy skin, feeling faint or lightheaded Increase in blood pressure Rash, fever, and swollen lymph nodes Redness, blistering, peeling, or loosening of the skin, including inside the mouth Seizures  Stroke--sudden numbness or weakness of the face, arm, or leg, trouble speaking, confusion, trouble walking, loss of balance or coordination, dizziness, severe headache, change in vision Side effects that usually do not require medical attention (report to your care team if they continue or are bothersome): Cough Stomach pain Swelling of the ankles, hands, or  feet This list may not describe all possible side effects. Call your doctor for medical advice about side effects. You may report side effects to FDA at 1-800-FDA-1088. Where should I keep my medication? Keep out of the reach of children and pets. Store in a refrigerator. Do not freeze. Do not shake. Protect from light. Keep this medication in the original container until you are ready to take it. See product for storage information. Get rid of any unused medication after the expiration date. To get rid of medications that are no longer needed or have expired: Take the medication to a medication take-back program. Check with your pharmacy or law enforcement to find a location. If you cannot return the medication, ask your pharmacist or care team how to get rid of the medication safely. NOTE: This sheet is a summary. It may not cover all possible information. If you have questions about this medicine, talk to your doctor, pharmacist, or health care provider.  2024 Elsevier/Gold Standard (2021-06-11 00:00:00)Implanted [facility] Guide An implanted port is a device that is placed under the skin. It is usually placed in the chest. The device may vary based on the need. Implanted ports can be used to give IV medicine, to take blood, or to give fluids. You may have an implanted port if: You need IV medicine that would be irritating to the small veins in your hands or arms. You need IV medicines, such as chemotherapy, for a long period of time. You need IV nutrition for a long period of time. You may have fewer limitations when using a port than you would if you used other types of long-term IVs. You will also likely be able to return to normal activities after your incision heals. An implanted port has two main parts: Reservoir. The reservoir is the part where a needle is inserted to give medicines or draw blood. The reservoir is round. After the port is placed, it appears as a small, raised area under  your skin. Catheter. The catheter is a small, thin tube that connects the reservoir to a vein. Medicine that is inserted into the reservoir goes into the catheter and then into the vein. How is my port accessed? To access your port: A numbing cream may be placed on the skin over the port site. Your health care provider will put on a mask and sterile gloves. The skin over your port will be cleaned carefully with a germ-killing soap and allowed to dry. Your health care provider will gently pinch the port and insert a needle into it. Your health care provider will check for a blood return to make sure the port is in the vein and is still working (patent). If your port needs to remain accessed to get medicine continuously (constant infusion), your health care provider will place a clear bandage (dressing) over the needle site. The dressing and needle will need to be changed every week, or as told by your health care provider. What is flushing? Flushing helps keep the port working. Follow instructions from your health care provider about how and when to flush the port. Ports are usually flushed with saline solution or a  medicine called heparin . The need for flushing will depend on how the port is used: If the port is only used from time to time to give medicines or draw blood, the port may need to be flushed: Before and after medicines have been given. Before and after blood has been drawn. As part of routine maintenance. Flushing may be recommended every 4-6 weeks. If a constant infusion is running, the port may not need to be flushed. Throw away any syringes in a disposal container that is meant for sharp items (sharps container). You can buy a sharps container from a pharmacy, or you can make one by using an empty hard plastic bottle with a cover. How long will my port stay implanted? The port can stay in for as long as your health care provider thinks it is needed. When it is time for the port to  come out, a surgery will be done to remove it. The surgery will be similar to the procedure that was done to put the port in. Follow these instructions at home: Caring for your port and port site Flush your port as told by your health care provider. If you need an infusion over several days, follow instructions from your health care provider about how to take care of your port site. Make sure you: Change your dressing as told by your health care provider. Wash your hands with soap and water for at least 20 seconds before and after you change your dressing. If soap and water are not available, use alcohol -based hand sanitizer. Place any used dressings or infusion bags into a plastic bag. Throw that bag in the trash. Keep the dressing that covers the needle clean and dry. Do not get it wet. Do not use scissors or sharp objects near the infusion tubing. Keep any external tubes clamped, unless they are being used. Check your port site every day for signs of infection. Check for: Redness, swelling, or pain. Fluid or blood. Warmth. Pus or a bad smell. Protect the skin around the port site. Avoid wearing bra straps that rub or irritate the site. Protect the skin around your port from seat belts. Place a soft pad over your chest if needed. Bathe or shower as told by your health care provider. The site may get wet as long as you are not actively receiving an infusion. General instructions  Return to your normal activities as told by your health care provider. Ask your health care provider what activities are safe for you. Carry a medical alert card or wear a medical alert bracelet at all times. This will let health care providers know that you have an implanted port in case of an emergency. Where to find more information American Cancer Society: www.cancer.org American Society of Clinical Oncology: www.cancer.net Contact a health care provider if: You have a fever or chills. You have redness,  swelling, or pain at the port site. You have fluid or blood coming from your port site. Your incision feels warm to the touch. You have pus or a bad smell coming from the port site. Summary Implanted ports are usually placed in the chest for long-term IV access. Follow instructions from your health care provider about flushing the port and changing bandages (dressings). Take care of the area around your port by avoiding clothing that puts pressure on the area, and by watching for signs of infection. Protect the skin around your port from seat belts. Place a soft pad over your chest if needed. Contact  a health care provider if you have a fever or you have redness, swelling, pain, fluid, or a bad smell at the port site. This information is not intended to replace advice given to you by your health care provider. Make sure you discuss any questions you have with your health care provider. Document Revised: 08/13/2020 Document Reviewed: 08/13/2020 Elsevier Patient Education  2024 [employer].

## 2023-07-23 ENCOUNTER — Encounter: Payer: Self-pay | Admitting: Podiatry

## 2023-07-23 ENCOUNTER — Ambulatory Visit (INDEPENDENT_AMBULATORY_CARE_PROVIDER_SITE_OTHER): Payer: 59 | Admitting: Podiatry

## 2023-07-23 DIAGNOSIS — M79674 Pain in right toe(s): Secondary | ICD-10-CM

## 2023-07-23 DIAGNOSIS — E1151 Type 2 diabetes mellitus with diabetic peripheral angiopathy without gangrene: Secondary | ICD-10-CM

## 2023-07-23 DIAGNOSIS — L84 Corns and callosities: Secondary | ICD-10-CM

## 2023-07-23 DIAGNOSIS — B351 Tinea unguium: Secondary | ICD-10-CM

## 2023-07-23 DIAGNOSIS — M79675 Pain in left toe(s): Secondary | ICD-10-CM

## 2023-07-23 NOTE — Progress Notes (Signed)
 This patient returns to my office for at risk foot care.  This patient requires this care by a professional since this patient will be at risk due to having diabetes type 2.  This patient is unable to cut nails himself since the patient cannot reach his nails.These nails are painful walking and wearing shoes.  painful corn between his 4/5 toes right foot.  This patient presents for at risk foot care today.  General Appearance  Alert, conversant and in no acute stress.  Vascular  Dorsalis pedis and posterior tibial  pulses are weakly  palpable  bilaterally.  Capillary return is within normal limits  bilaterally. Cold feet bilaterally.  Absent digital hair.  Neurologic  Senn-Weinstein monofilament wire test within normal limits/diminished right.  LOPS absent left foot. Muscle power within normal limits bilaterally.  Nails Thick disfigured discolored nails with subungual debris  from hallux to fifth toes bilaterally. No evidence of bacterial infection or drainage bilaterally.  Orthopedic  No limitations of motion  feet .  No crepitus or effusions noted.  No bony pathology or digital deformities noted.  HAV  B/L.  Exostosis right foot.Plantar flexed fifth metatarsall B/l.  Skin  normotropic skin with no porokeratosis noted bilaterally.  No signs of infections or ulcers noted.  Callus sub 5th met  B/l asymptomatic.  HM 4th interspace right foot. No infection.  Onychomycosis  Pain in right toes  Pain in left toes    Consent was obtained for treatment procedures.   Mechanical debridement of nails 1-5  bilaterally performed with a nail nipper.  Filed with dremel without incident. Debride callus HM with # 15 blade and dremel tool.  Patient requests surgical correction fifth toe right foot.   Return office visit    10 weeks                  Told patient to return for periodic foot care and evaluation due to potential at risk complications.   Ruffin Cotton DPM

## 2023-07-26 ENCOUNTER — Telehealth: Payer: Self-pay | Admitting: Podiatry

## 2023-07-26 NOTE — Telephone Encounter (Signed)
 Have surgical consent form.  Lvm for pt to call to schedule surgery and discuss appt on 6/18

## 2023-07-29 ENCOUNTER — Inpatient Hospital Stay

## 2023-07-29 ENCOUNTER — Inpatient Hospital Stay (HOSPITAL_BASED_OUTPATIENT_CLINIC_OR_DEPARTMENT_OTHER): Admitting: Medical Oncology

## 2023-07-29 ENCOUNTER — Ambulatory Visit: Payer: Self-pay | Admitting: Hematology & Oncology

## 2023-07-29 ENCOUNTER — Inpatient Hospital Stay: Attending: Hematology & Oncology

## 2023-07-29 ENCOUNTER — Encounter: Payer: Self-pay | Admitting: Medical Oncology

## 2023-07-29 VITALS — BP 122/63 | HR 66 | Temp 98.2°F | Resp 18 | Ht 71.0 in | Wt 170.1 lb

## 2023-07-29 DIAGNOSIS — D631 Anemia in chronic kidney disease: Secondary | ICD-10-CM

## 2023-07-29 DIAGNOSIS — N189 Chronic kidney disease, unspecified: Secondary | ICD-10-CM | POA: Diagnosis not present

## 2023-07-29 DIAGNOSIS — C9000 Multiple myeloma not having achieved remission: Secondary | ICD-10-CM

## 2023-07-29 DIAGNOSIS — E611 Iron deficiency: Secondary | ICD-10-CM

## 2023-07-29 DIAGNOSIS — E349 Endocrine disorder, unspecified: Secondary | ICD-10-CM

## 2023-07-29 DIAGNOSIS — N184 Chronic kidney disease, stage 4 (severe): Secondary | ICD-10-CM | POA: Diagnosis present

## 2023-07-29 DIAGNOSIS — E291 Testicular hypofunction: Secondary | ICD-10-CM | POA: Diagnosis not present

## 2023-07-29 DIAGNOSIS — Z299 Encounter for prophylactic measures, unspecified: Secondary | ICD-10-CM

## 2023-07-29 LAB — CBC WITH DIFFERENTIAL (CANCER CENTER ONLY)
Abs Immature Granulocytes: 0.06 10*3/uL (ref 0.00–0.07)
Basophils Absolute: 0.1 10*3/uL (ref 0.0–0.1)
Basophils Relative: 1 %
Eosinophils Absolute: 0.2 10*3/uL (ref 0.0–0.5)
Eosinophils Relative: 3 %
HCT: 30.4 % — ABNORMAL LOW (ref 39.0–52.0)
Hemoglobin: 9.2 g/dL — ABNORMAL LOW (ref 13.0–17.0)
Immature Granulocytes: 1 %
Lymphocytes Relative: 14 %
Lymphs Abs: 0.9 10*3/uL (ref 0.7–4.0)
MCH: 23.2 pg — ABNORMAL LOW (ref 26.0–34.0)
MCHC: 30.3 g/dL (ref 30.0–36.0)
MCV: 76.8 fL — ABNORMAL LOW (ref 80.0–100.0)
Monocytes Absolute: 0.9 10*3/uL (ref 0.1–1.0)
Monocytes Relative: 13 %
Neutro Abs: 4.6 10*3/uL (ref 1.7–7.7)
Neutrophils Relative %: 68 %
Platelet Count: 264 10*3/uL (ref 150–400)
RBC: 3.96 MIL/uL — ABNORMAL LOW (ref 4.22–5.81)
RDW: 19.6 % — ABNORMAL HIGH (ref 11.5–15.5)
WBC Count: 6.8 10*3/uL (ref 4.0–10.5)
nRBC: 0 % (ref 0.0–0.2)

## 2023-07-29 LAB — CMP (CANCER CENTER ONLY)
ALT: 12 U/L (ref 0–44)
AST: 16 U/L (ref 15–41)
Albumin: 4 g/dL (ref 3.5–5.0)
Alkaline Phosphatase: 96 U/L (ref 38–126)
Anion gap: 11 (ref 5–15)
BUN: 43 mg/dL — ABNORMAL HIGH (ref 8–23)
CO2: 25 mmol/L (ref 22–32)
Calcium: 8 mg/dL — ABNORMAL LOW (ref 8.9–10.3)
Chloride: 103 mmol/L (ref 98–111)
Creatinine: 3.96 mg/dL — ABNORMAL HIGH (ref 0.61–1.24)
GFR, Estimated: 15 mL/min — ABNORMAL LOW (ref >60)
Glucose, Bld: 102 mg/dL — ABNORMAL HIGH (ref 70–99)
Potassium: 3.9 mmol/L (ref 3.5–5.1)
Sodium: 139 mmol/L (ref 135–145)
Total Bilirubin: 0.4 mg/dL (ref 0.0–1.2)
Total Protein: 7.8 g/dL (ref 6.5–8.1)

## 2023-07-29 LAB — IRON AND IRON BINDING CAPACITY (CC-WL,HP ONLY)
Iron: 21 ug/dL — ABNORMAL LOW (ref 45–182)
Saturation Ratios: 11 % — ABNORMAL LOW (ref 17.9–39.5)
TIBC: 192 ug/dL — ABNORMAL LOW (ref 250–450)
UIBC: 171 ug/dL (ref 117–376)

## 2023-07-29 LAB — LACTATE DEHYDROGENASE: LDH: 214 U/L — ABNORMAL HIGH (ref 98–192)

## 2023-07-29 LAB — FERRITIN: Ferritin: 1104 ng/mL — ABNORMAL HIGH (ref 24–336)

## 2023-07-29 MED ORDER — CYANOCOBALAMIN 1000 MCG/ML IJ SOLN
1000.0000 ug | Freq: Once | INTRAMUSCULAR | Status: AC
  Administered 2023-07-29: 1000 ug via INTRAMUSCULAR
  Filled 2023-07-29: qty 1

## 2023-07-29 MED ORDER — IRON SUCROSE 20 MG/ML IV SOLN: 300.0000 mg | Freq: Once | INTRAVENOUS | Status: DC

## 2023-07-29 MED ORDER — EPOETIN ALFA-EPBX 40000 UNIT/ML IJ SOLN
40000.0000 [IU] | Freq: Once | INTRAMUSCULAR | Status: AC
  Administered 2023-07-29: 40000 [IU] via SUBCUTANEOUS
  Filled 2023-07-29: qty 1

## 2023-07-29 MED ORDER — SODIUM CHLORIDE 0.9 % IV SOLN: Freq: Once | INTRAVENOUS | Status: AC

## 2023-07-29 MED ORDER — TESTOSTERONE CYPIONATE 200 MG/ML IM SOLN
400.0000 mg | Freq: Once | INTRAMUSCULAR | Status: AC
  Administered 2023-07-29: 400 mg via INTRAMUSCULAR
  Filled 2023-07-29: qty 2

## 2023-07-29 MED ORDER — HYDROMORPHONE HCL 1 MG/ML IJ SOLN
2.0000 mg | Freq: Once | INTRAMUSCULAR | Status: AC
  Administered 2023-07-29: 2 mg via INTRAVENOUS
  Filled 2023-07-29: qty 2

## 2023-07-29 MED ORDER — SODIUM CHLORIDE 0.9% FLUSH
10.0000 mL | INTRAVENOUS | Status: DC | PRN
  Administered 2023-07-29: 10 mL via INTRAVENOUS

## 2023-07-29 MED ORDER — HEPARIN SOD (PORK) LOCK FLUSH 100 UNIT/ML IV SOLN
500.0000 [IU] | Freq: Once | INTRAVENOUS | Status: AC
  Administered 2023-07-29: 500 [IU] via INTRAVENOUS

## 2023-07-29 NOTE — Telephone Encounter (Signed)
 This RN called patient and let him know the following: " The iron  is low. He clearly needs iron ". Pt verbalized understanding and had no further questions. Pt aware he will get his first dose at his next appointment and scheduling will call to schedule the following 2 infusions.

## 2023-07-29 NOTE — Patient Instructions (Signed)
 [facility] - A DEPT OF [facility]  Discharge Instructions: Thank you for choosing [facility] to provide your oncology and hematology care.   If you have a lab appointment with the Cancer Center, please go directly to the Cancer Center and check in at the registration area.  Wear comfortable clothing and clothing appropriate for easy access to any Portacath or PICC line.   We strive to give you quality time with your provider. You may need to reschedule your appointment if you arrive late (15 or more minutes).  Arriving late affects you and other patients whose appointments are after yours.  Also, if you miss three or more appointments without notifying the office, you may be dismissed from the clinic at the provider's discretion.      For prescription refill requests, have your pharmacy contact our office and allow 72 hours for refills to be completed.    Today you received the following chemotherapy and/or immunotherapy agents retacrit , testosterone ,B-12,Dilaudid      To help prevent nausea and vomiting after your treatment, we encourage you to take your nausea medication as directed.  BELOW ARE SYMPTOMS THAT SHOULD BE REPORTED IMMEDIATELY: *FEVER GREATER THAN 100.4 F (38 C) OR HIGHER *CHILLS OR SWEATING *NAUSEA AND VOMITING THAT IS NOT CONTROLLED WITH YOUR NAUSEA MEDICATION *UNUSUAL SHORTNESS OF BREATH *UNUSUAL BRUISING OR BLEEDING *URINARY PROBLEMS (pain or burning when urinating, or frequent urination) *BOWEL PROBLEMS (unusual diarrhea, constipation, pain near the anus) TENDERNESS IN MOUTH AND THROAT WITH OR WITHOUT PRESENCE OF ULCERS (sore throat, sores in mouth, or a toothache) UNUSUAL RASH, SWELLING OR PAIN  UNUSUAL VAGINAL DISCHARGE OR ITCHING   Items with * indicate a potential emergency and should be followed up as soon as possible or go to the Emergency Department if any problems should occur.  Please show the CHEMOTHERAPY  ALERT CARD or IMMUNOTHERAPY ALERT CARD at check-in to the Emergency Department and triage nurse. Should you have questions after your visit or need to cancel or reschedule your appointment, please contact [facility] - A DEPT OF [facility]  [phone] and follow the prompts.  Office hours are 8:00 a.m. to 4:30 p.m. Monday - Friday. Please note that voicemails left after 4:00 p.m. may not be returned until the following business day.  We are closed weekends and major holidays. You have access to a nurse at all times for urgent questions. Please call the main number to the clinic [phone] and follow the prompts.  For any non-urgent questions, you may also contact your provider using MyChart. We now offer e-Visits for anyone 64 and older to request care online for non-urgent symptoms. For details visit [email or web address].   Also download the MyChart app! Go to the app store, search "MyChart", open the app, select [place], and log in with your MyChart username and password.

## 2023-07-29 NOTE — Progress Notes (Signed)
 Hematology and Oncology Follow Up Visit  [name] [id number] 1950-10-21 73 y.o. 07/29/2023   Principle Diagnosis:  IgG kappa myeloma Anemia secondary to renal insufficiency Intermittent iron  - deficiency anemia Hypotestosteronemia   Prior Therapy:        Kyprolis /Cytoxan  - s/p cycle #40 --DC on 12/24/2022  Current Therapy:  Aredia  60 mg IV q  month -- Started on 07/2022 Retacrit  SQ as needed for hemoglobin less than 10 DepoTestosterone 400 mg q 4 weeks B12 IM monthly IV iron  as indicated   Interim History:  [name] is here today for follow-up and consideration of treatment.   He states that other than lack of appetite, fatigue and SOB with activity he feels ok. This is all slowly improving with his current treatment regimen.   There has been no bleeding to his knowledge: denies epistaxis, gingivitis, hemoptysis, hematemesis, hematuria, melena, excessive bruising, blood donation.   No jaw pain. He is working with his dentist regarding some chronic pain he has had. No planned extractions or dental work.   No fevers. No night sweats. No new bone pains. No chest pain  His last CT scan was performed on 06/21/2023.  The CT scan did not show any evidence of malignancy.  There were no osseous bony lesions from myeloma.  Wt Readings from Last 3 Encounters:  07/29/23 170 lb 1.3 oz (77.1 kg)  07/01/23 175 lb (79.4 kg)  06/03/23 169 lb (76.7 kg)     Past Medical History:  Diagnosis Date   Anemia    Anemia of renal disease 03/12/2011   Anemia, iron  deficiency 03/12/2011   Anxiety    Anxiety disorder    Arthralgia of multiple joints 12/31/2010   Arthritis    Constipation    Diabetes mellitus without complication (HCC)    Diabetic neuropathy (HCC)    Diverticulosis    Epidermoid cyst of skin of chest 08/29/2018   Fibromyalgia    GERD (gastroesophageal reflux disease)    Headache    Hemorrhoids    Herpes zoster    History of COVID-19 04/03/2019   Hyperlipidemia     Hypertension    Hypotestosteronism 03/17/2012   Multiple myeloma    Multiple myeloma (HCC)    Myeloma (HCC) 12/31/2010   Nevus of left elbow 08/29/2018   Pneumonia    Renal insufficiency    Type 2 diabetes mellitus (HCC)    Urinary retention   :   Past Surgical History:  Procedure Laterality Date   BALLOON DILATION N/A 05/16/2021   Procedure: BALLOON DILATION;  Surgeon: Lajuan Pila, MD;  Location: [facility] ENDOSCOPY;  Service: Gastroenterology;  Laterality: N/A;   BIOPSY  05/16/2021   Procedure: BIOPSY;  Surgeon: Lajuan Pila, MD;  Location: [facility] ENDOSCOPY;  Service: Gastroenterology;;   CATARACT EXTRACTION     bi-lateral   COLONOSCOPY     COLONOSCOPY  07/13/2011   Procedure: COLONOSCOPY;  Surgeon: Asencion Blacksmith, MD,FACG;  Location: [facility] ENDOSCOPY;  Service: Endoscopy;  Laterality: N/A;   CYST EXCISION Right 08/29/2018   Procedure: CYST REMOVAL;  Surgeon: Boyce Byes, MD;  Location: [facility];  Service: General;  Laterality: Right;   ESOPHAGOGASTRODUODENOSCOPY     ESOPHAGOGASTRODUODENOSCOPY (EGD) WITH PROPOFOL  N/A 05/16/2021   Procedure: ESOPHAGOGASTRODUODENOSCOPY (EGD) WITH PROPOFOL ;  Surgeon: Lajuan Pila, MD;  Location: [facility] ENDOSCOPY;  Service: Gastroenterology;  Laterality: N/A;   IR CHEST FLUORO  09/22/2016   IR CHEST FLUORO  04/25/2020   IR FLUORO GUIDE PORT INSERTION RIGHT  09/23/2016  IR REMOVAL TUN ACCESS W/ PORT W/O FL MOD SED  09/23/2016   IR US  GUIDE VASC ACCESS LEFT  09/23/2016   KNEE ARTHROSCOPY     left   KNEE SURGERY     MASS EXCISION Right 05/09/2020   Procedure: RIGHT LONG FINGER EXCISION MASS,;  Surgeon: Brunilda Capra, MD;  Location: [facility];  Service: Orthopedics;  Laterality: Right;   NEVUS EXCISION N/A 08/29/2018   Procedure: , NEVUS LEFT ELBOW;  Surgeon: Boyce Byes, MD;  Location: [facility];  Service: General;  Laterality: N/A;   POLYPECTOMY  05/16/2021   Procedure: POLYPECTOMY;  Surgeon: Lajuan Pila, MD;  Location: [facility]  ENDOSCOPY;  Service: Gastroenterology;;   PORTACATH PLACEMENT     POSTERIOR CERVICAL FUSION/FORAMINOTOMY N/A 06/05/2016   Procedure: LAMINECTOMY CERVICAL 3 - CERVICAL 6 WITH LATERAL MASS FUSION;  Surgeon: Augusto Blonder, MD;  Location: [facility] OR;  Service: Neurosurgery;  Laterality: N/A;  LAMINECTOMY CERVICAL 3 - CERVICAL 6 WITH LATERAL MASS FUSION   TONSILLECTOMY     TRANSURETHRAL RESECTION OF PROSTATE  01/2011   Dr. at [facility] in [place]  :   Current Outpatient Medications:    amLODipine  (NORVASC ) 10 MG tablet, Take 10 mg by mouth daily., Disp: , Rfl:    ammonium lactate  (AMLACTIN) 12 % lotion, Apply 1 application topically as needed for dry skin., Disp: 400 g, Rfl: 0   atorvastatin  (LIPITOR) 40 MG tablet, Take 40 mg by mouth daily at 6 PM. , Disp: , Rfl:    budesonide-formoterol (SYMBICORT) 160-4.5 MCG/ACT inhaler, SMARTSIG:2 Puff(s) By Mouth Twice Daily, Disp: , Rfl:    CARAFATE  1 GM/10ML suspension, Take 1 g by mouth 4 (four) times daily. , Disp: , Rfl:    carvedilol (COREG) 25 MG tablet, Take 25 mg by mouth 2 (two) times daily., Disp: , Rfl:    [employer] Fiber Gummies 2 g CHEW, Chew by mouth daily., Disp: , Rfl:    cyclobenzaprine (FLEXERIL) 10 MG tablet, Take 10 mg by mouth 3 (three) times daily as needed., Disp: , Rfl:    diazepam  (VALIUM ) 5 MG tablet, Take by mouth at bedtime as needed., Disp: , Rfl:    diclofenac Sodium (VOLTAREN) 1 % GEL, Apply topically., Disp: , Rfl:    dicyclomine  (BENTYL ) 10 MG capsule, TAKE 1 CAPSULE BY MOUTH 3 TIMES A DAY BEFORE MEALS, Disp: 270 capsule, Rfl: 1   dronabinol  (MARINOL ) 5 MG capsule, Take 1 capsule (5 mg total) by mouth 2 (two) times daily before a meal., Disp: 60 capsule, Rfl: 1   gabapentin  (NEURONTIN ) 300 MG capsule, TAKE 1 CAPSULE BY MOUTH THREE TIMES A DAY (Patient taking differently: Take 300 mg by mouth 3 (three) times daily.), Disp: 270 capsule, Rfl: 1   glimepiride  (AMARYL ) 2 MG tablet, Take 2 mg by mouth daily., Disp: , Rfl:     hydrocortisone  2.5 % cream, Apply topically 2 (two) times daily., Disp: , Rfl:    JANUVIA 100 MG tablet, Take 100 mg by mouth daily., Disp: , Rfl:    KLOR-CON  M20 20 MEQ tablet, Take 20 mEq by mouth daily., Disp: , Rfl:    lactulose  (CHRONULAC ) 10 GM/15ML solution, Take 30 mLs (20 g total) by mouth daily., Disp: 946 mL, Rfl: 1   latanoprost  (XALATAN ) 0.005 % ophthalmic solution, Place 1 drop into both eyes at bedtime., Disp: , Rfl:    linaclotide  (LINZESS ) 145 MCG CAPS capsule, Take by mouth daily as needed., Disp: , Rfl:  losartan  (COZAAR ) 50 MG tablet, Take 50 mg by mouth daily., Disp: , Rfl:    LUMIGAN 0.01 % SOLN, Place 1 drop into both eyes 3 (three) times daily., Disp: , Rfl:    mesalamine  (LIALDA ) 1.2 g EC tablet, Take by mouth daily with breakfast., Disp: , Rfl:    methocarbamol  (ROBAXIN ) 500 MG tablet, Take 1 tablet (500 mg total) by mouth 2 (two) times daily., Disp: 20 tablet, Rfl: 0   metoprolol  succinate (TOPROL -XL) 25 MG 24 hr tablet, Take 25 mg by mouth daily., Disp: , Rfl:    mirabegron  ER (MYRBETRIQ ) 25 MG TB24 tablet, Take 20 mg by mouth daily., Disp: , Rfl:    morphine  (MS CONTIN ) 30 MG 12 hr tablet, Take 30 mg by mouth 2 (two) times daily., Disp: , Rfl:    omeprazole  (PRILOSEC) 40 MG capsule, TAKE 1 CAPSULE BY MOUTH TWICE A DAY, Disp: 180 capsule, Rfl: 0   oxyCODONE  (ROXICODONE ) 5 MG immediate release tablet, 1 tab PO q6 hours prn pain, Disp: 15 tablet, Rfl: 0   oxyCODONE  ER (XTAMPZA  ER) 13.5 MG C12A, daily., Disp: , Rfl:    oxyCODONE -acetaminophen  (PERCOCET) 10-325 MG tablet, Take 1 tablet by mouth every 6 (six) hours as needed for pain., Disp: , Rfl:    pioglitazone  (ACTOS ) 30 MG tablet, Take 30 mg by mouth daily., Disp: , Rfl:    polyethylene glycol powder (GLYCOLAX /MIRALAX ) 17 GM/SCOOP powder, SMARTSIG:1 scoopful By Mouth Twice Daily PRN, Disp: , Rfl:    prochlorperazine  (COMPAZINE ) 10 MG tablet, take 1 tablet by mouth every 6 hours if needed for nausea and vomiting, Disp:  30 tablet, Rfl: 1   RESTASIS  0.05 % ophthalmic emulsion, Place 1 drop into both eyes daily. , Disp: , Rfl:    sildenafil (VIAGRA) 100 MG tablet, , Disp: , Rfl:    SUMAtriptan (IMITREX) 100 MG tablet, Take 100 mg by mouth daily., Disp: , Rfl:    topiramate  (TOPAMAX ) 25 MG capsule, Take 25 mg by mouth 2 (two) times daily., Disp: , Rfl:    topiramate  (TOPAMAX ) 50 MG tablet, Take 50 mg by mouth at bedtime., Disp: , Rfl:    torsemide  (DEMADEX ) 20 MG tablet, Take 20 mg by mouth daily., Disp: , Rfl:    Vitamin D , Ergocalciferol , (DRISDOL ) 50000 UNITS CAPS capsule, Take 50,000 Units by mouth every Saturday. , Disp: , Rfl:    chlorpheniramine-HYDROcodone  10-8 MG/5ML, SMARTSIG:5 Milliliter(s) By Mouth Every 12 Hours (Patient not taking: Reported on 07/29/2023), Disp: , Rfl:  No current facility-administered medications for this visit.  Facility-Administered Medications Ordered in Other Visits:    0.9 %  sodium chloride  infusion, , Intravenous, Continuous, Tish Forge, NP, Stopped at 10/20/19 1037   furosemide  (LASIX ) injection 40 mg, 40 mg, Intravenous, Once, Ennever, Sherryll Donald, MD   HYDROmorphone  (DILAUDID ) 1 MG/ML injection, , , ,    morphine  4 MG/ML injection 4 mg, 4 mg, Intravenous, Q4H PRN, Ennever, Peter R, MD   sodium chloride  flush (NS) 0.9 % injection 10 mL, 10 mL, Intravenous, PRN, Tish Forge, NP, 10 mL at 10/20/19 1042:   Allergies  Allergen Reactions   Iodinated Contrast Media Rash    Patient states he was instructed not to take IV contrast.  In 2008 he had an unknown reaction, and was told not to take it again.  He was also told not to take it due to his kidneys.   Ioversol    Prednisone    Iodine Anxiety, Rash and Other (See  Comments)    Didn't feel right "instructed not to take per MD--something with his port"    Family History  Problem Relation Age of Onset   Breast cancer Sister    Breast cancer Mother    Heart disease Mother    Diabetes Sister    Bone cancer Father     Heart disease Sister    Heart disease Daughter    Throat cancer Brother    Stomach cancer Brother    Colon cancer Neg Hx    Social History   Socioeconomic History   Marital status: Divorced    Spouse name: Not on file   Number of children: 3   Years of education: Not on file   Highest education level: Not on file  Occupational History   Occupation: retired    Associate Professor: [employer]  Tobacco Use   Smoking status: Never   Smokeless tobacco: Never  Vaping Use   Vaping status: Never Used  Substance and Sexual Activity   Alcohol  use: Not Currently    Alcohol /week: 0.0 standard drinks of alcohol    Drug use: Not Currently    Comment: nothing in twenty years   Sexual activity: Yes  Other Topics Concern   Not on file  Social History Narrative   Lives alone in a one story home. Has 3 children.  Retired Financial risk analyst.  Education: 12 grade.       Social Drivers of Corporate investment banker Strain: Not on file  Food Insecurity: Not on file  Transportation Needs: Not on file  Physical Activity: Not on file  Stress: Not on file  Social Connections: Not on file  Intimate Partner Violence: Not on file   Pertinent items are noted in HPI.  Vitals:   07/29/23 0858  BP: 122/63  Pulse: 66  Resp: 18  Temp: 98.2 F (36.8 C)  SpO2: 97%    Physical Exam Vitals reviewed.  HENT:     Head: Normocephalic and atraumatic.  Eyes:     Pupils: Pupils are equal, round, and reactive to light.  Cardiovascular:     Rate and Rhythm: Normal rate and regular rhythm.     Heart sounds: Normal heart sounds.  Pulmonary:     Effort: Pulmonary effort is normal.     Breath sounds: Normal breath sounds.  Abdominal:     General: Bowel sounds are normal.     Palpations: Abdomen is soft.  Musculoskeletal:        General: No tenderness or deformity. Normal range of motion.     Cervical back: Normal range of motion.  Lymphadenopathy:     Cervical: No cervical adenopathy.  Skin:    General: Skin is  warm and dry.     Findings: No erythema or rash.  Neurological:     Mental Status: He is alert and oriented to person, place, and time.  Psychiatric:        Behavior: Behavior normal.        Thought Content: Thought content normal.        Judgment: Judgment normal.     Lab Results  Component Value Date   IGGSERUM 1,570 07/01/2023   IGA 238 07/01/2023   IGMSERUM 38 07/01/2023   Lab Results  Component Value Date   TOTALPROTELP 6.7 07/01/2023   ALBUMINELP 3.3 07/01/2023   A1GS 0.2 07/01/2023   A2GS 0.9 07/01/2023   BETS 0.8 07/01/2023   BETA2SER 0.5 11/07/2014   GAMS 1.4 07/01/2023   MSPIKE Not Observed  07/01/2023   SPEI Comment 05/06/2023     Chemistry      Component Value Date/Time   NA 139 07/22/2023 0745   NA 143 02/22/2017 0744   K 4.7 07/22/2023 0745   K 4.0 02/22/2017 0744   CL 105 07/22/2023 0745   CL 106 02/22/2017 0744   CO2 27 07/22/2023 0745   CO2 27 02/22/2017 0744   BUN 39 (H) 07/22/2023 0745   BUN 26 (H) 02/22/2017 0744   CREATININE 3.45 (H) 07/22/2023 0745   CREATININE 2.3 (H) 02/22/2017 0744      Component Value Date/Time   CALCIUM 8.7 (L) 07/22/2023 0745   CALCIUM 9.3 02/22/2017 0744   ALKPHOS 100 07/22/2023 0745   ALKPHOS 80 02/22/2017 0744   AST 16 07/22/2023 0745   ALT 12 07/22/2023 0745   ALT 22 02/22/2017 0744   BILITOT 0.4 07/22/2023 0745      Encounter Diagnoses  Name Primary?   Multiple myeloma not having achieved remission (HCC) Yes   Anemia of chronic renal failure, unspecified CKD stage    Hypotestosteronism    Low iron     Impression and Plan: [name] is a very pleasant 73 yo African American gentleman with IgG kappa myeloma. He is off of treatment for his myeloma due to poor tolerance and patient preference.   Currently he is getting weekly retacrit  in order to get his Hgb up. He also has received IV iron  and B12 IM injections. He also gets Aredia  and testosterone . These are slowly helping him feel better. He is  tolerating them well.   Continue follow up with his dentist- please have them contact us  prior to any planned dental work  As long as CMP is in range he will get Aredia  today  B12, testosterone  today as planned.  RTC 1 month MD, port labs, Aredia /B12/Testosterone   Sharla Davis, PA-C 6/5/20259:14 AM

## 2023-07-29 NOTE — Telephone Encounter (Signed)
-----   Message from [name] sent at 07/29/2023  2:40 PM EDT ----- Please call and let him know that he clearly needs iron .  The iron  is low.  Please set this up for next week.  [name]

## 2023-07-29 NOTE — Patient Instructions (Signed)

## 2023-07-30 LAB — TESTOSTERONE: Testosterone: 173 ng/dL — ABNORMAL LOW (ref 264–916)

## 2023-07-30 LAB — IGG, IGA, IGM
IgA: 275 mg/dL (ref 61–437)
IgG (Immunoglobin G), Serum: 1928 mg/dL — ABNORMAL HIGH (ref 603–1613)
IgM (Immunoglobulin M), Srm: 43 mg/dL (ref 15–143)

## 2023-07-30 LAB — KAPPA/LAMBDA LIGHT CHAINS
Kappa free light chain: 124.6 mg/L — ABNORMAL HIGH (ref 3.3–19.4)
Kappa, lambda light chain ratio: 1.75 — ABNORMAL HIGH (ref 0.26–1.65)
Lambda free light chains: 71 mg/L — ABNORMAL HIGH (ref 5.7–26.3)

## 2023-08-02 LAB — PROTEIN ELECTROPHORESIS, SERUM, WITH REFLEX
A/G Ratio: 0.9 (ref 0.7–1.7)
Albumin ELP: 3.4 g/dL (ref 2.9–4.4)
Alpha-1-Globulin: 0.2 g/dL (ref 0.0–0.4)
Alpha-2-Globulin: 0.9 g/dL (ref 0.4–1.0)
Beta Globulin: 0.9 g/dL (ref 0.7–1.3)
Gamma Globulin: 1.6 g/dL (ref 0.4–1.8)
Globulin, Total: 3.7 g/dL (ref 2.2–3.9)
Total Protein ELP: 7.1 g/dL (ref 6.0–8.5)

## 2023-08-04 ENCOUNTER — Other Ambulatory Visit: Payer: Self-pay

## 2023-08-05 ENCOUNTER — Inpatient Hospital Stay

## 2023-08-05 VITALS — BP 130/63 | HR 66 | Temp 97.9°F | Resp 19

## 2023-08-05 DIAGNOSIS — Z299 Encounter for prophylactic measures, unspecified: Secondary | ICD-10-CM

## 2023-08-05 DIAGNOSIS — N189 Chronic kidney disease, unspecified: Secondary | ICD-10-CM

## 2023-08-05 DIAGNOSIS — N184 Chronic kidney disease, stage 4 (severe): Secondary | ICD-10-CM | POA: Diagnosis not present

## 2023-08-05 DIAGNOSIS — E349 Endocrine disorder, unspecified: Secondary | ICD-10-CM

## 2023-08-05 DIAGNOSIS — C9000 Multiple myeloma not having achieved remission: Secondary | ICD-10-CM | POA: Diagnosis not present

## 2023-08-05 MED ORDER — SODIUM CHLORIDE 0.9 % IV SOLN
300.0000 mg | Freq: Once | INTRAVENOUS | Status: AC
  Administered 2023-08-05: 300 mg via INTRAVENOUS
  Filled 2023-08-05: qty 300

## 2023-08-05 MED ORDER — HEPARIN SOD (PORK) LOCK FLUSH 100 UNIT/ML IV SOLN
500.0000 [IU] | Freq: Once | INTRAVENOUS | Status: AC
  Administered 2023-08-05: 500 [IU] via INTRAVENOUS

## 2023-08-05 MED ORDER — SODIUM CHLORIDE 0.9 % IV SOLN: Freq: Once | INTRAVENOUS | Status: AC

## 2023-08-05 MED ORDER — SODIUM CHLORIDE 0.9% FLUSH
10.0000 mL | INTRAVENOUS | Status: DC | PRN
  Administered 2023-08-05: 10 mL via INTRAVENOUS

## 2023-08-05 NOTE — Patient Instructions (Signed)

## 2023-08-11 ENCOUNTER — Ambulatory Visit (INDEPENDENT_AMBULATORY_CARE_PROVIDER_SITE_OTHER): Admitting: Podiatry

## 2023-08-11 DIAGNOSIS — M216X1 Other acquired deformities of right foot: Secondary | ICD-10-CM | POA: Diagnosis not present

## 2023-08-11 DIAGNOSIS — L84 Corns and callosities: Secondary | ICD-10-CM | POA: Diagnosis not present

## 2023-08-11 DIAGNOSIS — Z01818 Encounter for other preprocedural examination: Secondary | ICD-10-CM | POA: Diagnosis not present

## 2023-08-11 DIAGNOSIS — M2041 Other hammer toe(s) (acquired), right foot: Secondary | ICD-10-CM | POA: Diagnosis not present

## 2023-08-11 NOTE — Progress Notes (Unsigned)
 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

## 2023-08-12 ENCOUNTER — Inpatient Hospital Stay

## 2023-08-12 VITALS — BP 120/61 | HR 66 | Temp 97.9°F | Resp 18

## 2023-08-12 DIAGNOSIS — N184 Chronic kidney disease, stage 4 (severe): Secondary | ICD-10-CM | POA: Diagnosis not present

## 2023-08-12 DIAGNOSIS — Z299 Encounter for prophylactic measures, unspecified: Secondary | ICD-10-CM

## 2023-08-12 DIAGNOSIS — E349 Endocrine disorder, unspecified: Secondary | ICD-10-CM

## 2023-08-12 DIAGNOSIS — C9000 Multiple myeloma not having achieved remission: Secondary | ICD-10-CM | POA: Diagnosis not present

## 2023-08-12 DIAGNOSIS — D631 Anemia in chronic kidney disease: Secondary | ICD-10-CM

## 2023-08-12 MED ORDER — SODIUM CHLORIDE 0.9 % IV SOLN: Freq: Once | INTRAVENOUS | Status: AC

## 2023-08-12 MED ORDER — SODIUM CHLORIDE 0.9% FLUSH: 10.0000 mL | INTRAVENOUS | Status: DC | PRN

## 2023-08-12 MED ORDER — SODIUM CHLORIDE 0.9 % IV SOLN
300.0000 mg | Freq: Once | INTRAVENOUS | Status: AC
  Administered 2023-08-12: 300 mg via INTRAVENOUS
  Filled 2023-08-12: qty 300

## 2023-08-12 MED ORDER — HEPARIN SOD (PORK) LOCK FLUSH 100 UNIT/ML IV SOLN: 500.0000 [IU] | Freq: Once | INTRAVENOUS | Status: AC

## 2023-08-12 NOTE — Patient Instructions (Signed)

## 2023-08-19 ENCOUNTER — Ambulatory Visit: Payer: Self-pay | Admitting: Medical Oncology

## 2023-08-19 ENCOUNTER — Inpatient Hospital Stay

## 2023-08-19 ENCOUNTER — Emergency Department (HOSPITAL_COMMUNITY)

## 2023-08-19 ENCOUNTER — Other Ambulatory Visit: Payer: Self-pay

## 2023-08-19 ENCOUNTER — Inpatient Hospital Stay (HOSPITAL_BASED_OUTPATIENT_CLINIC_OR_DEPARTMENT_OTHER)
Admission: EM | Admit: 2023-08-19 | Discharge: 2023-08-21 | DRG: 291 | Disposition: A | Discharge status: Home / Self Care | Attending: Internal Medicine | Admitting: Internal Medicine

## 2023-08-19 ENCOUNTER — Encounter (HOSPITAL_BASED_OUTPATIENT_CLINIC_OR_DEPARTMENT_OTHER): Payer: Self-pay

## 2023-08-19 ENCOUNTER — Emergency Department (HOSPITAL_BASED_OUTPATIENT_CLINIC_OR_DEPARTMENT_OTHER)

## 2023-08-19 ENCOUNTER — Other Ambulatory Visit: Payer: Self-pay | Admitting: Medical Oncology

## 2023-08-19 VITALS — BP 144/60 | HR 84 | Temp 98.4°F | Resp 18

## 2023-08-19 DIAGNOSIS — E785 Hyperlipidemia, unspecified: Secondary | ICD-10-CM | POA: Diagnosis present

## 2023-08-19 DIAGNOSIS — I130 Hypertensive heart and chronic kidney disease with heart failure and stage 1 through stage 4 chronic kidney disease, or unspecified chronic kidney disease: Secondary | ICD-10-CM | POA: Diagnosis present

## 2023-08-19 DIAGNOSIS — E349 Endocrine disorder, unspecified: Secondary | ICD-10-CM

## 2023-08-19 DIAGNOSIS — I5033 Acute on chronic diastolic (congestive) heart failure: Secondary | ICD-10-CM | POA: Diagnosis present

## 2023-08-19 DIAGNOSIS — E1140 Type 2 diabetes mellitus with diabetic neuropathy, unspecified: Secondary | ICD-10-CM | POA: Diagnosis present

## 2023-08-19 DIAGNOSIS — R0600 Dyspnea, unspecified: Principal | ICD-10-CM | POA: Diagnosis present

## 2023-08-19 DIAGNOSIS — Z8249 Family history of ischemic heart disease and other diseases of the circulatory system: Secondary | ICD-10-CM

## 2023-08-19 DIAGNOSIS — E872 Acidosis, unspecified: Secondary | ICD-10-CM | POA: Diagnosis present

## 2023-08-19 DIAGNOSIS — R600 Localized edema: Secondary | ICD-10-CM

## 2023-08-19 DIAGNOSIS — N189 Chronic kidney disease, unspecified: Secondary | ICD-10-CM | POA: Diagnosis present

## 2023-08-19 DIAGNOSIS — Z91041 Radiographic dye allergy status: Secondary | ICD-10-CM | POA: Diagnosis not present

## 2023-08-19 DIAGNOSIS — Z79899 Other long term (current) drug therapy: Secondary | ICD-10-CM

## 2023-08-19 DIAGNOSIS — Z8616 Personal history of COVID-19: Secondary | ICD-10-CM

## 2023-08-19 DIAGNOSIS — Z800 Family history of malignant neoplasm of digestive organs: Secondary | ICD-10-CM

## 2023-08-19 DIAGNOSIS — M7989 Other specified soft tissue disorders: Secondary | ICD-10-CM | POA: Diagnosis not present

## 2023-08-19 DIAGNOSIS — Z5941 Food insecurity: Secondary | ICD-10-CM | POA: Diagnosis not present

## 2023-08-19 DIAGNOSIS — K219 Gastro-esophageal reflux disease without esophagitis: Secondary | ICD-10-CM | POA: Diagnosis present

## 2023-08-19 DIAGNOSIS — N184 Chronic kidney disease, stage 4 (severe): Secondary | ICD-10-CM | POA: Diagnosis present

## 2023-08-19 DIAGNOSIS — Z7984 Long term (current) use of oral hypoglycemic drugs: Secondary | ICD-10-CM

## 2023-08-19 DIAGNOSIS — M797 Fibromyalgia: Secondary | ICD-10-CM | POA: Diagnosis present

## 2023-08-19 DIAGNOSIS — G8929 Other chronic pain: Secondary | ICD-10-CM | POA: Diagnosis present

## 2023-08-19 DIAGNOSIS — I10 Essential (primary) hypertension: Secondary | ICD-10-CM | POA: Diagnosis not present

## 2023-08-19 DIAGNOSIS — E8809 Other disorders of plasma-protein metabolism, not elsewhere classified: Secondary | ICD-10-CM | POA: Diagnosis present

## 2023-08-19 DIAGNOSIS — R0602 Shortness of breath: Secondary | ICD-10-CM | POA: Diagnosis present

## 2023-08-19 DIAGNOSIS — E1122 Type 2 diabetes mellitus with diabetic chronic kidney disease: Secondary | ICD-10-CM | POA: Diagnosis present

## 2023-08-19 DIAGNOSIS — M1990 Unspecified osteoarthritis, unspecified site: Secondary | ICD-10-CM | POA: Diagnosis present

## 2023-08-19 DIAGNOSIS — N179 Acute kidney failure, unspecified: Secondary | ICD-10-CM | POA: Diagnosis present

## 2023-08-19 DIAGNOSIS — D631 Anemia in chronic kidney disease: Secondary | ICD-10-CM | POA: Diagnosis present

## 2023-08-19 DIAGNOSIS — Z888 Allergy status to other drugs, medicaments and biological substances status: Secondary | ICD-10-CM

## 2023-08-19 DIAGNOSIS — E119 Type 2 diabetes mellitus without complications: Secondary | ICD-10-CM | POA: Diagnosis not present

## 2023-08-19 DIAGNOSIS — R5383 Other fatigue: Secondary | ICD-10-CM

## 2023-08-19 DIAGNOSIS — C9000 Multiple myeloma not having achieved remission: Secondary | ICD-10-CM | POA: Diagnosis present

## 2023-08-19 DIAGNOSIS — Z7951 Long term (current) use of inhaled steroids: Secondary | ICD-10-CM

## 2023-08-19 DIAGNOSIS — R0609 Other forms of dyspnea: Secondary | ICD-10-CM | POA: Diagnosis not present

## 2023-08-19 DIAGNOSIS — Z743 Need for continuous supervision: Secondary | ICD-10-CM | POA: Diagnosis not present

## 2023-08-19 DIAGNOSIS — Z833 Family history of diabetes mellitus: Secondary | ICD-10-CM | POA: Diagnosis not present

## 2023-08-19 DIAGNOSIS — G43909 Migraine, unspecified, not intractable, without status migrainosus: Secondary | ICD-10-CM | POA: Diagnosis present

## 2023-08-19 DIAGNOSIS — E8770 Fluid overload, unspecified: Secondary | ICD-10-CM

## 2023-08-19 DIAGNOSIS — Z803 Family history of malignant neoplasm of breast: Secondary | ICD-10-CM

## 2023-08-19 DIAGNOSIS — Z299 Encounter for prophylactic measures, unspecified: Secondary | ICD-10-CM

## 2023-08-19 DIAGNOSIS — Z808 Family history of malignant neoplasm of other organs or systems: Secondary | ICD-10-CM

## 2023-08-19 DIAGNOSIS — I5031 Acute diastolic (congestive) heart failure: Secondary | ICD-10-CM | POA: Diagnosis not present

## 2023-08-19 LAB — CBC WITH DIFFERENTIAL (CANCER CENTER ONLY)
Abs Immature Granulocytes: 0.05 10*3/uL (ref 0.00–0.07)
Basophils Absolute: 0.1 10*3/uL (ref 0.0–0.1)
Basophils Relative: 1 %
Eosinophils Absolute: 0.3 10*3/uL (ref 0.0–0.5)
Eosinophils Relative: 4 %
HCT: 29.1 % — ABNORMAL LOW (ref 39.0–52.0)
Hemoglobin: 8.8 g/dL — ABNORMAL LOW (ref 13.0–17.0)
Immature Granulocytes: 1 %
Lymphocytes Relative: 16 %
Lymphs Abs: 1 10*3/uL (ref 0.7–4.0)
MCH: 22.7 pg — ABNORMAL LOW (ref 26.0–34.0)
MCHC: 30.2 g/dL (ref 30.0–36.0)
MCV: 75 fL — ABNORMAL LOW (ref 80.0–100.0)
Monocytes Absolute: 0.9 10*3/uL (ref 0.1–1.0)
Monocytes Relative: 14 %
Neutro Abs: 4.1 10*3/uL (ref 1.7–7.7)
Neutrophils Relative %: 64 %
Platelet Count: 188 10*3/uL (ref 150–400)
RBC: 3.88 MIL/uL — ABNORMAL LOW (ref 4.22–5.81)
RDW: 19.8 % — ABNORMAL HIGH (ref 11.5–15.5)
WBC Count: 6.3 10*3/uL (ref 4.0–10.5)
nRBC: 0 % (ref 0.0–0.2)

## 2023-08-19 LAB — HEMOGLOBIN A1C
Hgb A1c MFr Bld: 5.2 % (ref 4.8–5.6)
Mean Plasma Glucose: 102.54 mg/dL

## 2023-08-19 LAB — TROPONIN T, HIGH SENSITIVITY
Troponin T High Sensitivity: 137 ng/L (ref <19)
Troponin T High Sensitivity: 116 ng/L (ref <19)

## 2023-08-19 LAB — PRO BRAIN NATRIURETIC PEPTIDE: Pro Brain Natriuretic Peptide: 1003 pg/mL — ABNORMAL HIGH (ref <300.0)

## 2023-08-19 LAB — CBC
HCT: 29.1 % — ABNORMAL LOW (ref 39.0–52.0)
Hemoglobin: 8.9 g/dL — ABNORMAL LOW (ref 13.0–17.0)
MCH: 23 pg — ABNORMAL LOW (ref 26.0–34.0)
MCHC: 30.6 g/dL (ref 30.0–36.0)
MCV: 75.2 fL — ABNORMAL LOW (ref 80.0–100.0)
Platelets: 192 10*3/uL (ref 150–400)
RBC: 3.87 MIL/uL — ABNORMAL LOW (ref 4.22–5.81)
RDW: 19.8 % — ABNORMAL HIGH (ref 11.5–15.5)
WBC: 6.4 10*3/uL (ref 4.0–10.5)
nRBC: 0 % (ref 0.0–0.2)

## 2023-08-19 LAB — CMP (CANCER CENTER ONLY)
ALT: 15 U/L (ref 0–44)
AST: 20 U/L (ref 15–41)
Albumin: 3.9 g/dL (ref 3.5–5.0)
Alkaline Phosphatase: 78 U/L (ref 38–126)
Anion gap: 9 (ref 5–15)
BUN: 58 mg/dL — ABNORMAL HIGH (ref 8–23)
CO2: 24 mmol/L (ref 22–32)
Calcium: 8.4 mg/dL — ABNORMAL LOW (ref 8.9–10.3)
Chloride: 104 mmol/L (ref 98–111)
Creatinine: 3.99 mg/dL — ABNORMAL HIGH (ref 0.61–1.24)
GFR, Estimated: 15 mL/min — ABNORMAL LOW (ref >60)
Glucose, Bld: 90 mg/dL (ref 70–99)
Potassium: 4.5 mmol/L (ref 3.5–5.1)
Sodium: 137 mmol/L (ref 135–145)
Total Bilirubin: 0.3 mg/dL (ref 0.0–1.2)
Total Protein: 7.2 g/dL (ref 6.5–8.1)

## 2023-08-19 LAB — BASIC METABOLIC PANEL WITH GFR
Anion gap: 0 — ABNORMAL LOW (ref 5–15)
BUN: 55 mg/dL — ABNORMAL HIGH (ref 8–23)
CO2: 20 mmol/L — ABNORMAL LOW (ref 22–32)
Calcium: 8.5 mg/dL — ABNORMAL LOW (ref 8.9–10.3)
Chloride: 101 mmol/L (ref 98–111)
Creatinine, Ser: 3.95 mg/dL — ABNORMAL HIGH (ref 0.61–1.24)
GFR, Estimated: 15 mL/min — ABNORMAL LOW (ref >60)
Glucose, Bld: 91 mg/dL (ref 70–99)
Potassium: 4.7 mmol/L (ref 3.5–5.1)
Sodium: 137 mmol/L (ref 135–145)

## 2023-08-19 LAB — GLUCOSE, CAPILLARY: Glucose-Capillary: 98 mg/dL (ref 70–99)

## 2023-08-19 MED ORDER — MORPHINE SULFATE ER 15 MG PO TBCR
30.0000 mg | EXTENDED_RELEASE_TABLET | Freq: Two times a day (BID) | ORAL | Status: DC
  Administered 2023-08-19 – 2023-08-21 (×4): 30 mg via ORAL
  Filled 2023-08-19 (×4): qty 2

## 2023-08-19 MED ORDER — ONDANSETRON HCL 4 MG PO TABS
4.0000 mg | ORAL_TABLET | Freq: Four times a day (QID) | ORAL | Status: DC | PRN
Start: 2023-08-19 — End: 2023-08-21

## 2023-08-19 MED ORDER — SUCRALFATE 1 GM/10ML PO SUSP: 1.0000 g | Freq: Four times a day (QID) | ORAL | Status: DC

## 2023-08-19 MED ORDER — OXYCODONE-ACETAMINOPHEN 10-325 MG PO TABS: 1.0000 | ORAL_TABLET | Freq: Four times a day (QID) | ORAL | Status: DC | PRN

## 2023-08-19 MED ORDER — DIAZEPAM 5 MG PO TABS: 5.0000 mg | ORAL_TABLET | Freq: Every evening | ORAL | Status: DC | PRN

## 2023-08-19 MED ORDER — TECHNETIUM TO 99M ALBUMIN AGGREGATED
4.4000 | Freq: Once | INTRAVENOUS | Status: AC | PRN
  Administered 2023-08-19: 4.4 via INTRAVENOUS

## 2023-08-19 MED ORDER — FUROSEMIDE 10 MG/ML IJ SOLN
40.0000 mg | Freq: Once | INTRAMUSCULAR | Status: AC
  Administered 2023-08-19: 40 mg via INTRAVENOUS
  Filled 2023-08-19 (×2): qty 4

## 2023-08-19 MED ORDER — SODIUM CHLORIDE 0.9 % IV SOLN
300.0000 mg | Freq: Once | INTRAVENOUS | Status: AC
  Administered 2023-08-19: 300 mg via INTRAVENOUS
  Filled 2023-08-19: qty 300

## 2023-08-19 MED ORDER — SODIUM CHLORIDE 0.9% FLUSH
10.0000 mL | Freq: Once | INTRAVENOUS | Status: AC
  Administered 2023-08-19: 10 mL via INTRAVENOUS

## 2023-08-19 MED ORDER — METOPROLOL SUCCINATE ER 25 MG PO TB24
25.0000 mg | ORAL_TABLET | Freq: Every day | ORAL | Status: DC
  Administered 2023-08-19 – 2023-08-21 (×3): 25 mg via ORAL
  Filled 2023-08-19 (×3): qty 1

## 2023-08-19 MED ORDER — SODIUM CHLORIDE 0.9 % IV SOLN: INTRAVENOUS | Status: DC

## 2023-08-19 MED ORDER — PROCHLORPERAZINE MALEATE 10 MG PO TABS: 10.0000 mg | ORAL_TABLET | Freq: Four times a day (QID) | ORAL | Status: DC | PRN

## 2023-08-19 MED ORDER — ATORVASTATIN CALCIUM 40 MG PO TABS: 40.0000 mg | ORAL_TABLET | Freq: Every day | ORAL | Status: DC

## 2023-08-19 MED ORDER — LACTULOSE 10 GM/15ML PO SOLN
20.0000 g | Freq: Every day | ORAL | Status: DC
  Filled 2023-08-19: qty 30

## 2023-08-19 MED ORDER — FLUTICASONE FUROATE-VILANTEROL 200-25 MCG/ACT IN AEPB: 1.0000 | INHALATION_SPRAY | Freq: Every day | RESPIRATORY_TRACT | Status: DC

## 2023-08-19 MED ORDER — INSULIN ASPART 100 UNIT/ML IJ SOLN: 0.0000 [IU] | Freq: Every day | INTRAMUSCULAR | Status: DC

## 2023-08-19 MED ORDER — AMMONIUM LACTATE 12 % EX LOTN
1.0000 | TOPICAL_LOTION | CUTANEOUS | Status: DC | PRN
  Filled 2023-08-19: qty 225

## 2023-08-19 MED ORDER — INSULIN ASPART 100 UNIT/ML IJ SOLN
0.0000 [IU] | Freq: Three times a day (TID) | INTRAMUSCULAR | Status: DC
  Administered 2023-08-20: 2 [IU] via SUBCUTANEOUS

## 2023-08-19 MED ORDER — LACTULOSE 10 GM/15ML PO SOLN: 20.0000 g | Freq: Every day | ORAL | Status: DC

## 2023-08-19 MED ORDER — GABAPENTIN 300 MG PO CAPS
300.0000 mg | ORAL_CAPSULE | Freq: Three times a day (TID) | ORAL | Status: DC
  Administered 2023-08-19 – 2023-08-21 (×5): 300 mg via ORAL
  Filled 2023-08-19 (×5): qty 1

## 2023-08-19 MED ORDER — ONDANSETRON HCL 4 MG/2ML IJ SOLN: 4.0000 mg | Freq: Four times a day (QID) | INTRAMUSCULAR | Status: DC | PRN

## 2023-08-19 MED ORDER — TOPIRAMATE 25 MG PO CPSP: 25.0000 mg | ORAL_CAPSULE | Freq: Two times a day (BID) | ORAL | Status: DC

## 2023-08-19 MED ORDER — DRONABINOL 5 MG PO CAPS: 5.0000 mg | ORAL_CAPSULE | Freq: Two times a day (BID) | ORAL | Status: DC

## 2023-08-19 MED ORDER — OXYCODONE-ACETAMINOPHEN 5-325 MG PO TABS
1.0000 | ORAL_TABLET | Freq: Four times a day (QID) | ORAL | Status: DC | PRN
  Administered 2023-08-20 – 2023-08-21 (×4): 1 via ORAL
  Filled 2023-08-19 (×4): qty 1

## 2023-08-19 MED ORDER — ACETAMINOPHEN 650 MG RE SUPP: 650.0000 mg | Freq: Four times a day (QID) | RECTAL | Status: DC | PRN

## 2023-08-19 MED ORDER — HEPARIN SODIUM (PORCINE) 5000 UNIT/ML IJ SOLN
5000.0000 [IU] | Freq: Three times a day (TID) | INTRAMUSCULAR | Status: DC
Start: 2023-08-19 — End: 2023-08-21
  Administered 2023-08-19 – 2023-08-21 (×5): 5000 [IU] via SUBCUTANEOUS
  Filled 2023-08-19 (×5): qty 1

## 2023-08-19 MED ORDER — OXYCODONE HCL 5 MG PO TABS
5.0000 mg | ORAL_TABLET | Freq: Four times a day (QID) | ORAL | Status: DC | PRN
  Administered 2023-08-19: 5 mg via ORAL
  Filled 2023-08-19 (×2): qty 1

## 2023-08-19 MED ORDER — OXYCODONE HCL 5 MG PO TABS
5.0000 mg | ORAL_TABLET | Freq: Four times a day (QID) | ORAL | Status: DC | PRN
  Administered 2023-08-20 – 2023-08-21 (×4): 5 mg via ORAL
  Filled 2023-08-19 (×3): qty 1

## 2023-08-19 MED ORDER — HYDRALAZINE HCL 20 MG/ML IJ SOLN: 10.0000 mg | Freq: Four times a day (QID) | INTRAMUSCULAR | Status: DC | PRN

## 2023-08-19 MED ORDER — IPRATROPIUM BROMIDE 0.02 % IN SOLN
0.5000 mg | Freq: Four times a day (QID) | RESPIRATORY_TRACT | Status: DC
  Administered 2023-08-20: 0.5 mg via RESPIRATORY_TRACT
  Filled 2023-08-19: qty 2.5

## 2023-08-19 MED ORDER — TOPIRAMATE 25 MG PO TABS: 50.0000 mg | ORAL_TABLET | Freq: Every day | ORAL | Status: DC

## 2023-08-19 MED ORDER — CARVEDILOL 12.5 MG PO TABS: 25.0000 mg | ORAL_TABLET | Freq: Two times a day (BID) | ORAL | Status: DC

## 2023-08-19 MED ORDER — DICYCLOMINE HCL 10 MG PO CAPS
10.0000 mg | ORAL_CAPSULE | Freq: Three times a day (TID) | ORAL | Status: DC
  Administered 2023-08-20 – 2023-08-21 (×5): 10 mg via ORAL
  Filled 2023-08-19 (×5): qty 1

## 2023-08-19 MED ORDER — HEPARIN SOD (PORK) LOCK FLUSH 100 UNIT/ML IV SOLN
500.0000 [IU] | Freq: Once | INTRAVENOUS | Status: AC
  Administered 2023-08-19: 500 [IU] via INTRAVENOUS

## 2023-08-19 MED ORDER — TOPIRAMATE 25 MG PO TABS
50.0000 mg | ORAL_TABLET | Freq: Every day | ORAL | Status: DC
Start: 2023-08-19 — End: 2023-08-19

## 2023-08-19 MED ORDER — LEVALBUTEROL HCL 0.63 MG/3ML IN NEBU
0.6300 mg | INHALATION_SOLUTION | Freq: Four times a day (QID) | RESPIRATORY_TRACT | Status: DC
  Administered 2023-08-20: 0.63 mg via RESPIRATORY_TRACT
  Filled 2023-08-19: qty 3

## 2023-08-19 MED ORDER — LINACLOTIDE 145 MCG PO CAPS: 145.0000 ug | ORAL_CAPSULE | Freq: Every day | ORAL | Status: DC | PRN

## 2023-08-19 MED ORDER — LATANOPROST 0.005 % OP SOLN: 1.0000 [drp] | Freq: Every day | OPHTHALMIC | Status: DC

## 2023-08-19 MED ORDER — FUROSEMIDE 10 MG/ML IJ SOLN
40.0000 mg | Freq: Two times a day (BID) | INTRAMUSCULAR | Status: DC
  Administered 2023-08-19 – 2023-08-21 (×4): 40 mg via INTRAVENOUS
  Filled 2023-08-19 (×5): qty 4

## 2023-08-19 MED ORDER — LIVING BETTER WITH HEART FAILURE BOOK: Freq: Once | Status: DC

## 2023-08-19 MED ORDER — ACETAMINOPHEN 325 MG PO TABS
650.0000 mg | ORAL_TABLET | Freq: Four times a day (QID) | ORAL | Status: DC | PRN
Start: 2023-08-19 — End: 2023-08-21

## 2023-08-19 NOTE — ED Notes (Signed)
 Called and placed PT on monitor with [facility].

## 2023-08-19 NOTE — ED Triage Notes (Signed)
 Pt sent from cancer center for complaints of SOB x 4 days. Denies Cough or pain. Endorses new swelling in lower extremities for 2 weeks. Difficult breathing worse when lying down. Weak and states falling asleep several times while driving to office

## 2023-08-19 NOTE — H&P (Addendum)
 History and Physical    Patient: [name] FMW:[id number] DOB: [birth year] DOA: 08/19/2023 DOS: the patient was seen and examined on 08/19/2023 PCP: Sim Emery CROME, MD  Patient coming from: Home via his oncology clinic visit  Chief Complaint:  Chief Complaint  Patient presents with   Shortness of Breath   HPI: [name] is a 73 y.o. male with medical history significant for but not limited to anxiety, history of anemia/iron  deficiency and anemia renal disease, diabetes mellitus type 2, GERD, history of multiple myeloma, hypertension, hyperlipidemia, CKD stage IV and other comorbidities who presents with dyspnea which is progressed over the last 3 to 4 days.  Patient states that he became more short of breath and it is difficult for him to lay flat as well as ambulate without getting short of breath.  Also notices that his legs are extremely swollen and states that they have been worsening in the last 3 to 4 days as well.  Denies any chest pain.  No nausea or vomiting, lightheadedness or dizziness.  Was recently evaluated by his oncologist today and was sent to the ED for further evaluation.  TR[facility] was asked to admit this patient for dyspnea and volume overload with concern for CHF exacerbation of unclear etiology  Review of Systems: As mentioned in the history of present illness. All other systems reviewed and are negative. Past Medical History:  Diagnosis Date   Anemia    Anemia of renal disease 03/12/2011   Anemia, iron  deficiency 03/12/2011   Anxiety    Anxiety disorder    Arthralgia of multiple joints 12/31/2010   Arthritis    Constipation    Diabetes mellitus without complication (HCC)    Diabetic neuropathy (HCC)    Diverticulosis    Epidermoid cyst of skin of chest 08/29/2018   Fibromyalgia    GERD (gastroesophageal reflux disease)    Headache    Hemorrhoids    Herpes zoster    History of COVID-19 04/03/2019   Hyperlipidemia    Hypertension    Hypotestosteronism  03/17/2012   Multiple myeloma    Multiple myeloma (HCC)    Myeloma (HCC) 12/31/2010   Nevus of left elbow 08/29/2018   Pneumonia    Renal insufficiency    Type 2 diabetes mellitus (HCC)    Urinary retention    Past Surgical History:  Procedure Laterality Date   BALLOON DILATION N/A 05/16/2021   Procedure: BALLOON DILATION;  Surgeon: Charlanne Groom, MD;  Location: [facility] ENDOSCOPY;  Service: Gastroenterology;  Laterality: N/A;   BIOPSY  05/16/2021   Procedure: BIOPSY;  Surgeon: Charlanne Groom, MD;  Location: [facility] ENDOSCOPY;  Service: Gastroenterology;;   CATARACT EXTRACTION     bi-lateral   COLONOSCOPY     COLONOSCOPY  07/13/2011   Procedure: COLONOSCOPY;  Surgeon: Gwendlyn ONEIDA Buddy, MD,FACG;  Location: [facility] ENDOSCOPY;  Service: Endoscopy;  Laterality: N/A;   CYST EXCISION Right 08/29/2018   Procedure: CYST REMOVAL;  Surgeon: Gail Favorite, MD;  Location: [facility];  Service: General;  Laterality: Right;   ESOPHAGOGASTRODUODENOSCOPY     ESOPHAGOGASTRODUODENOSCOPY (EGD) WITH PROPOFOL  N/A 05/16/2021   Procedure: ESOPHAGOGASTRODUODENOSCOPY (EGD) WITH PROPOFOL ;  Surgeon: Charlanne Groom, MD;  Location: [facility] ENDOSCOPY;  Service: Gastroenterology;  Laterality: N/A;   IR CHEST FLUORO  09/22/2016   IR CHEST FLUORO  04/25/2020   IR FLUORO GUIDE PORT INSERTION RIGHT  09/23/2016   IR REMOVAL TUN ACCESS W/ PORT W/O FL MOD SED  09/23/2016   IR US  GUIDE VASC ACCESS  LEFT  09/23/2016   KNEE ARTHROSCOPY     left   KNEE SURGERY     MASS EXCISION Right 05/09/2020   Procedure: RIGHT LONG FINGER EXCISION MASS,;  Surgeon: Murrell Drivers, MD;  Location: [facility];  Service: Orthopedics;  Laterality: Right;   NEVUS EXCISION N/A 08/29/2018   Procedure: , NEVUS LEFT ELBOW;  Surgeon: Gail Favorite, MD;  Location: [facility];  Service: General;  Laterality: N/A;   POLYPECTOMY  05/16/2021   Procedure: POLYPECTOMY;  Surgeon: Charlanne Groom, MD;  Location: [facility] ENDOSCOPY;  Service: Gastroenterology;;    PORTACATH PLACEMENT     POSTERIOR CERVICAL FUSION/FORAMINOTOMY N/A 06/05/2016   Procedure: LAMINECTOMY CERVICAL 3 - CERVICAL 6 WITH LATERAL MASS FUSION;  Surgeon: Gerldine Maizes, MD;  Location: [facility] OR;  Service: Neurosurgery;  Laterality: N/A;  LAMINECTOMY CERVICAL 3 - CERVICAL 6 WITH LATERAL MASS FUSION   TONSILLECTOMY     TRANSURETHRAL RESECTION OF PROSTATE  01/2011   Dr. at [facility] in [place]   Social History:  reports that he has never smoked. He has never used smokeless tobacco. He reports that he does not currently use alcohol . He reports that he does not currently use drugs.  Allergies  Allergen Reactions   Iodinated Contrast Media Rash    Patient states he was instructed not to take IV contrast.  In 2008 he had an unknown reaction, and was told not to take it again.  He was also told not to take it due to his kidneys.   Deltasone [Prednisone] Other (See Comments)    Unknown reaction   Ioversol Other (See Comments)    Unknown reaction   Iodine Anxiety, Rash and Other (See Comments)    Didn't feel right instructed not to take per MD--something with his port    Family History  Problem Relation Age of Onset   Breast cancer Sister    Breast cancer Mother    Heart disease Mother    Diabetes Sister    Bone cancer Father    Heart disease Sister    Heart disease Daughter    Throat cancer Brother    Stomach cancer Brother    Colon cancer Neg Hx    Prior to Admission medications   Medication Sig Start Date End Date Taking? Authorizing Provider  amLODipine  (NORVASC ) 10 MG tablet Take 10 mg by mouth daily.   Yes [provider]  amLODipine  (NORVASC ) 5 MG tablet Take 5 mg by mouth 2 (two) times daily.   Yes [provider]  gabapentin  (NEURONTIN ) 300 MG capsule TAKE 1 CAPSULE BY MOUTH THREE TIMES A DAY 07/20/19  Yes Franchot Lauraine HERO, NP  JANUVIA 100 MG tablet Take 100 mg by mouth daily. 09/30/22  Yes [provider]  metoprolol  succinate  (TOPROL -XL) 25 MG 24 hr tablet Take 25 mg by mouth daily. 07/28/19  Yes [provider]  morphine  (MS CONTIN ) 30 MG 12 hr tablet Take 30 mg by mouth 3 (three) times daily. 06/29/18  Yes [provider]  omeprazole  (PRILOSEC) 40 MG capsule TAKE 1 CAPSULE BY MOUTH TWICE A DAY Patient taking differently: Take 40 mg by mouth daily. 10/04/19  Yes Ennever, Maude SAUNDERS, MD  oxyCODONE -acetaminophen  (PERCOCET) 10-325 MG tablet Take 1 tablet by mouth every 4 (four) hours.   Yes [provider]  sildenafil (VIAGRA) 100 MG tablet Take 100 mg by mouth daily as needed for erectile dysfunction. 07/14/22  Yes [provider]  SUMAtriptan (IMITREX) 100  MG tablet Take 100 mg by mouth daily as needed for migraine. 08/22/19  Yes [provider]  torsemide  (DEMADEX ) 20 MG tablet Take 20 mg by mouth 2 (two) times daily. 01/12/19  Yes [provider]  Vitamin D , Ergocalciferol , (DRISDOL ) 50000 UNITS CAPS capsule Take 50,000 Units by mouth every Saturday.    Yes [provider]  amoxicillin  (AMOXIL ) 500 MG capsule Take 500 mg by mouth 2 (two) times daily. Patient not taking: Reported on 08/19/2023 08/02/23   [provider]  potassium chloride  (KLOR-CON ) 20 MEQ packet Take 20 mEq by mouth daily.   05/17/11  [provider]   Physical Exam: Vitals:   08/19/23 1600 08/19/23 1857 08/19/23 1947 08/19/23 2000  BP: (!) 159/76   (!) 154/78  Pulse: 82   75  Resp: 15   17  Temp:   97.9 F (36.6 C)   TempSrc:   Oral   SpO2: 100%   100%  Weight:  77.1 kg    Height:  5' 11 (1.803 m)     Examination: Physical Exam:  Constitutional: Chronically ill-appearing African-American male in no acute distress appears a little uncomfortable Respiratory: Diminished to auscultation bilaterally coarse breath sounds and has some crackles but no appreciable wheezing, rales or rhonchi. Normal respiratory effort and patient is not tachypenic. No accessory muscle use.   Unlabored breathing Cardiovascular: RRR, no murmurs / rubs / gallops. S1 and S2 auscultated.  Has 2+ lower extremity pitting edema Abdomen: Soft, non-tender, nondistended. Bowel sounds positive.  GU: Deferred. Musculoskeletal: No clubbing / cyanosis of digits/nails. No joint deformity upper and lower extremities.  Skin: No rashes, lesions, ulcers or limited skin evaluation. No induration; Warm and dry.  Neurologic: CN 2-12 grossly intact with no focal deficits.  Romberg sign cerebellar reflexes not assessed.  Psychiatric: Normal judgment and insight. Alert and oriented x 3. Normal mood and appropriate affect.   Data Reviewed: Patient's CBC was reviewed and he had a WBC of 6.4, hemoglobin/of 8.8, hematocrit of 29.1, platelet count 192.  proBNP was 1000.3, troponin T was 137 and trended down to 116.  CMP done and showed that patient had metabolic acidosis with a CO2 of 20, anion gap of 0 and a chloride level 101.  Patient's BUN/creatinine was 55/2.95 respectively.  EKG: Normal sinus rhythm at a rate of 79 with a QTc of 439 ms and borderline RAD and T wave inversion.  No evidence of ST elevation MI interpretation  Assessment and Plan: No notes have been filed under this hospital service. Service: Hospitalist  Dyspnea and lower extremity swelling rule out CHF, unclear what type at this point: Admit to cardiac telemetry and check echocardiogram and initiate diuresis.  Given 40 mg of IV Lasix  in the morning and will continue twice daily dosing.  Will need strict I's and O's and daily weights. Pro-BNP was 1003.0. Has a history of multiple myeloma so may have some cardiac involvement.  Rule out nephrotic syndrome and check UA.  CTA PE protocol not able to be safely done given his renal function so a VQ scan was done which was negative.  Chest x-ray 2 views did show some vascular congestion.  Will continue with carvedilol 12 5 mg p.o. twice daily.  Hold his home diuretics of torsemide .  Add Xopenex and  Atrovent and continue Breo Ellipta.  Since the extremities are swollen we will check lower extremity venous duplex to rule out DVT  IgG kappa multiple myeloma: Not in remission and sees  Dr. Timmy.  Is currently off of treatment due to poor tolerance and patient preference.  AKI on CKD Stage IV / Metabolic Acidosis: BUN/Cr Trend: Recent Labs  Lab 07/22/23 0745 07/29/23 0759 08/19/23 0934 08/19/23 0940  BUN 39* 43* 58* 55*  CREATININE 3.45* 3.96* 3.99* 3.95*  -C/w diuresis as above -Avoid Nephrotoxic Medications, Contrast Dyes, Hypotension and Dehydration to Ensure Adequate Renal Perfusion and will need to Renally Adjust Meds -Continue to Monitor and Trend Renal Function carefully and repeat CMP in the AM   Chronic Pain from MM: resume Home Pain regimen  Anemia of Chronic Renal disease/ Microcytic Anemia / Iron  deficiency Anemia: Hgb/Hct Trend:  Recent Labs  Lab 07/22/23 0745 07/29/23 0759 08/19/23 0934 08/19/23 0940  HGB 8.9* 9.2* 8.8* 8.9*  HCT 29.5* 30.4* 29.1* 29.1*  MCV 78.2* 76.8* 75.0* 75.2*  -Check Anemia Panel in the AM. CTM for S/Sx of Volume Overload. Repeat CBC in the AM  Diabetes mellitus type II with complication of diabetic neuropathy: Open A1c was 5.2.  Continue to monitor CBGs AC and at bedtime with sensitive NovoLog /scale insulin  AC and at bedtime.  Continue gabapentin  300 mg p.o. 3 times daily  Advance Care Planning:   Code Status: Full Code   Consults: None  Family Communication: D/w Family present @ bedside  Severity of Illness: The appropriate patient status for this patient is INPATIENT. Inpatient status is judged to be reasonable and necessary in order to provide the required intensity of service to ensure the patient's safety. The patient's presenting symptoms, physical exam findings, and initial radiographic and laboratory data in the context of their chronic comorbidities is felt to place them at high risk for further clinical deterioration.  Furthermore, it is not anticipated that the patient will be medically stable for discharge from the hospital within 2 midnights of admission.   * I certify that at the point of admission it is my clinical judgment that the patient will require inpatient hospital care spanning beyond 2 midnights from the point of admission due to high intensity of service, high risk for further deterioration and high frequency of surveillance required.*  Author: Alejandro Lazarus Marker, DO 08/19/2023 8:33 PM  For on call review [email or web address].

## 2023-08-19 NOTE — Progress Notes (Signed)
 Patient presents today for IV venofer . Patient complaining of feeling very fatigued the last several days as well as some trouble catching his breath. Lauraine Dais, PA-C chairside for evaluation and labs drawn per order. Per [employer] [place], patient to present to ED for further work up as labs do not look abnormal compared to patient's usual baseline. PAC flush/hep locked with biopatch and antimicrobial dressing in place prior to discharge. Patient verbalized understanding of POC and taken via wheelchair accompanied by staff to ED for further evaluation.

## 2023-08-19 NOTE — ED Notes (Addendum)
PT transported to nuclear medicine

## 2023-08-19 NOTE — ED Provider Notes (Signed)
 [facility] Provider Note   CSN: [id number] Arrival date & time: 08/19/23  1038     Patient presents with: Shortness of Breath   [name] is a 73 y.o. male with history of multiple myeloma, diabetes is, hyperlipidemia, hypertension who presents to the emergency department today with a 4-day history of shortness of breath primarily with exertion.  He does state that he is short of breath at rest but is significantly worse with exertion.  Patient states that he cannot lay flat at night but this is not a new problem.  He denies any chest pain, fever, chills, cough, congestion.  He does endorse associated bilateral lower extremity swelling.  Patient was upstairs receiving chemotherapy when he mentioned that he was short of breath and was prompted to come down to the emergency room for further evaluation.    Shortness of Breath      Prior to Admission medications   Medication Sig Start Date End Date Taking? Authorizing Provider  amLODipine  (NORVASC ) 10 MG tablet Take 10 mg by mouth daily.    [provider]  ammonium lactate  (AMLACTIN) 12 % lotion Apply 1 application topically as needed for dry skin. 04/11/21   Tobie Franky SQUIBB, DPM  amoxicillin  (AMOXIL ) 500 MG capsule Take 500 mg by mouth 2 (two) times daily. 08/02/23   [provider]  atorvastatin  (LIPITOR) 40 MG tablet Take 40 mg by mouth daily at 6 PM.  08/20/18   [provider]  budesonide-formoterol (SYMBICORT) 160-4.5 MCG/ACT inhaler SMARTSIG:2 Puff(s) By Mouth Twice Daily 03/18/21   [provider]  CARAFATE  1 GM/10ML suspension Take 1 g by mouth 4 (four) times daily.  07/28/11   [provider]  carvedilol (COREG) 25 MG tablet Take 25 mg by mouth 2 (two) times daily. 04/29/20   [provider]  chlorpheniramine-HYDROcodone  10-8 MG/5ML SMARTSIG:5 Milliliter(s) By Mouth Every 12 Hours Patient not taking: Reported on 08/12/2023 04/14/21    [provider]  [employer] Fiber Gummies 2 g CHEW Chew by mouth daily. 03/09/20   [provider]  cyclobenzaprine (FLEXERIL) 10 MG tablet Take 10 mg by mouth 3 (three) times daily as needed. 09/22/17   [provider]  diazepam  (VALIUM ) 5 MG tablet Take by mouth at bedtime as needed.    [provider]  diclofenac Sodium (VOLTAREN) 1 % GEL Apply topically. 07/18/22   [provider]  dicyclomine  (BENTYL ) 10 MG capsule TAKE 1 CAPSULE BY MOUTH 3 TIMES A DAY BEFORE MEALS 06/21/21   Timmy Maude SAUNDERS, MD  dronabinol  (MARINOL ) 5 MG capsule Take 1 capsule (5 mg total) by mouth 2 (two) times daily before a meal. 11/26/22   Ennever, Maude SAUNDERS, MD  gabapentin  (NEURONTIN ) 300 MG capsule TAKE 1 CAPSULE BY MOUTH THREE TIMES A DAY 07/20/19   Franchot Lauraine HERO, NP  glimepiride  (AMARYL ) 2 MG tablet Take 2 mg by mouth daily. 09/18/20   [provider]  hydrocortisone  2.5 % cream Apply topically 2 (two) times daily. 02/15/20   [provider]  JANUVIA 100 MG tablet Take 100 mg by mouth daily. 09/30/22   [provider]  KLOR-CON  M20 20 MEQ tablet Take 20 mEq by mouth daily. 08/17/22   [provider]  lactulose  (CHRONULAC ) 10 GM/15ML solution Take 30 mLs (20 g total) by mouth daily. 02/02/19   Timmy Maude SAUNDERS, MD  latanoprost  (XALATAN ) 0.005 % ophthalmic solution Place 1 drop into both eyes at bedtime. 01/10/20  [provider]  linaclotide  (LINZESS ) 145 MCG CAPS capsule Take by mouth daily as needed.    [provider]  losartan  (COZAAR ) 50 MG tablet Take 50 mg by mouth daily. 02/25/19   [provider]  LUMIGAN 0.01 % SOLN Place 1 drop into both eyes 3 (three) times daily. 01/06/20   [provider]  mesalamine  (LIALDA ) 1.2 g EC tablet Take by mouth daily with breakfast.    [provider]  methocarbamol  (ROBAXIN ) 500 MG tablet Take 1 tablet (500 mg total) by mouth 2 (two) times daily. 06/30/22   Desiderio Chew, PA-C  metoprolol  succinate (TOPROL -XL) 25 MG 24 hr tablet Take 25 mg by mouth daily. 07/28/19   [provider]  mirabegron  ER (MYRBETRIQ ) 25 MG TB24 tablet Take 20 mg by mouth daily.    [provider]  morphine  (MS CONTIN ) 30 MG 12 hr tablet Take 30 mg by mouth 2 (two) times daily. 06/29/18   [provider]  omeprazole  (PRILOSEC) 40 MG capsule TAKE 1 CAPSULE BY MOUTH TWICE A DAY 10/04/19   Timmy Maude SAUNDERS, MD  oxyCODONE  (ROXICODONE ) 5 MG immediate release tablet 1 tab PO q6 hours prn pain 05/09/20   Kuzma, Kevin, MD  oxyCODONE  ER (XTAMPZA  ER) 13.5 MG C12A daily. 06/16/18   [provider]  oxyCODONE -acetaminophen  (PERCOCET) 10-325 MG tablet Take 1 tablet by mouth every 6 (six) hours as needed for pain.    [provider]  pioglitazone  (ACTOS ) 30 MG tablet Take 30 mg by mouth daily. 03/06/18   [provider]  polyethylene glycol powder (GLYCOLAX /MIRALAX ) 17 GM/SCOOP powder SMARTSIG:1 scoopful By Mouth Twice Daily PRN 06/16/21   [provider]  prochlorperazine  (COMPAZINE ) 10 MG tablet take 1 tablet by mouth every 6 hours if needed for nausea and vomiting 12/29/21   Timmy Maude SAUNDERS, MD  RESTASIS  0.05 % ophthalmic emulsion Place 1 drop into both eyes daily.  05/30/19   [provider]  sildenafil (VIAGRA) 100 MG tablet  07/14/22   [provider]  SUMAtriptan (IMITREX) 100 MG tablet Take 100 mg by mouth daily. 08/22/19   [provider]  topiramate  (TOPAMAX ) 25 MG capsule Take 25 mg by mouth 2 (two) times daily.    [provider]  topiramate  (TOPAMAX ) 50 MG tablet Take 50 mg by mouth at bedtime. 07/07/21   [provider]  torsemide  (DEMADEX ) 20 MG tablet Take 20 mg by mouth daily. 01/12/19   [provider]  Vitamin D , Ergocalciferol , (DRISDOL ) 50000 UNITS CAPS capsule Take 50,000 Units by mouth every Saturday.     [provider]  potassium chloride  (KLOR-CON ) 20 MEQ packet  Take 20 mEq by mouth daily.   05/17/11  [provider]    Allergies: Iodinated contrast media, Ioversol, Prednisone, and Iodine    Review of Systems  Respiratory:  Positive for shortness of breath.   All other systems reviewed and are negative.   Updated Vital Signs BP (!) 152/68   Pulse 74   Temp 97.9 F (36.6 C)   Resp 10   Wt 77.1 kg   SpO2 97%   BMI 23.71 kg/m   Physical Exam Vitals and nursing note reviewed.  Constitutional:      General: He is not in acute distress.    Appearance: Normal appearance.  HENT:     Head: Normocephalic and atraumatic.   Eyes:     General:        Right eye: No discharge.  Left eye: No discharge.    Cardiovascular:     Comments: Regular rate and rhythm.  S1/S2 are distinct without any evidence of murmur, rubs, or gallops.  Radial pulses are 2+ bilaterally.  Dorsalis pedis pulses are 2+ bilaterally.   Pulmonary:     Comments: Clear to auscultation bilaterally.  Normal effort.  No respiratory distress.  No evidence of wheezes, rales, or rhonchi heard throughout. Abdominal:     General: Abdomen is flat. Bowel sounds are normal. There is no distension.     Tenderness: There is no abdominal tenderness. There is no guarding or rebound.   Musculoskeletal:        General: Normal range of motion.     Cervical back: Neck supple.     Right lower leg: 2+ Pitting Edema present.     Left lower leg: 2+ Pitting Edema present.   Skin:    General: Skin is warm and dry.     Findings: No rash.   Neurological:     General: No focal deficit present.     Mental Status: He is alert.   Psychiatric:        Mood and Affect: Mood normal.        Behavior: Behavior normal.     (all labs ordered are listed, but only abnormal results are displayed) Labs Reviewed  BASIC METABOLIC PANEL WITH GFR - Abnormal; Notable for the following components:      Result Value   CO2 20 (*)    BUN 55 (*)    Creatinine, Ser 3.95 (*)    Calcium 8.5 (*)     GFR, Estimated 15 (*)    Anion gap 0 (*)    All other components within normal limits  CBC - Abnormal; Notable for the following components:   RBC 3.87 (*)    Hemoglobin 8.9 (*)    HCT 29.1 (*)    MCV 75.2 (*)    MCH 23.0 (*)    RDW 19.8 (*)    All other components within normal limits  PRO BRAIN NATRIURETIC PEPTIDE - Abnormal; Notable for the following components:   Pro Brain Natriuretic Peptide 1,003.0 (*)    All other components within normal limits  TROPONIN T, HIGH SENSITIVITY - Abnormal; Notable for the following components:   Troponin T High Sensitivity 137 (*)    All other components within normal limits  TROPONIN T, HIGH SENSITIVITY    EKG: EKG Interpretation Date/Time:  Thursday August 19 2023 10:50:19 EDT Ventricular Rate:  79 PR Interval:  200 QRS Duration:  91 QT Interval:  383 QTC Calculation: 439 R Axis:   85  Text Interpretation: Sinus rhythm Borderline right axis deviation Borderline T wave abnormalities Confirmed by Ula Barter [phone]) on 08/19/2023 11:11:49 AM  Radiology: [facility] Chest 2 View Result Date: 08/19/2023 CLINICAL DATA:  Shortness of breath.  History of multiple myeloma EXAM: CHEST - 2 VIEW COMPARISON:  CT scan 06/21/2023 FINDINGS: Right IJ chest port in place with tip along the central SVC. Enlarged cardiopericardial silhouette. No pneumothorax, effusion or edema. Vascular congestion. Overlapping cardiac leads. Degenerative changes along the spine. Fixation hardware of the cervical spine at the very edge of the imaging field. IMPRESSION: No acute cardiopulmonary disease.  Chest port Electronically Signed   By: Ranell Bring M.D.   On: 08/19/2023 11:53     Procedures   Medications Ordered in the ED - No data to display  Clinical Course as of 08/19/23 1320  Thu Aug 19, 2023  1317 I spoke with Dr. Doretha emergency room attending over at [facility] in the [facility] [place] who accepts the patient for ED to ED transfer.  I have ordered the nuclear medicine  pulmonary perfusion study. [CF]  1318 Troponin T, High Sensitivity(!!) Initial troponin is elevated. [CF]  1318 Pro Brain natriuretic peptide(!) BNP is elevated does not meet criteria for heart failure. [CF]  1318 DG Chest 2 View This is normal.  I do agree with the radiologist interpretation.  This is somewhat more concerning for pulmonary embolism. [CF]  1318 CBC(!) Patient is anemic in the setting of multiple myeloma and chemotherapy. [CF]  1318 Basic metabolic panel(!) Chronic kidney disease with a creatinine of 4.  Too low for contrast. [CF]    Clinical Course User Index [CF] Theotis Cameron HERO, PA-C    Medical Decision Making Lynell Kussman is a 73 y.o. male patient presents to the emergency department today for further evaluation of shortness of breath.  Differential diagnosis does include congestive heart failure, ACS, pulmonary embolism, pneumonia.  Given that the patient cannot have contrast, does have significant risk factors for pulmonary embolism and with the elevated BNP and troponin patient will likely need to get a further workup for pulmonary embolism including a VQ scan.  We do not have at this facility and he will likely be transferred via transport to [facility].  As [facility] ED course, I spoke with Dr. Doretha who accepts the patient.  Patient is safe for discharge at this time.  He is agreeable to plan.   Amount and/or Complexity of Data Reviewed Labs: ordered. Decision-making details documented in ED Course. Radiology: ordered. Decision-making details documented in ED Course.    Final diagnoses:  None    ED Discharge Orders     None          Theotis Cameron HERO, [place] 08/19/23 1320    Ula Prentice SAUNDERS, MD 08/19/23 1406

## 2023-08-19 NOTE — ED Triage Notes (Signed)
 PT sent here from [facility] to have a VQ scan

## 2023-08-19 NOTE — ED Notes (Signed)
 Report to [place], Consulting civil engineer at [employer]

## 2023-08-19 NOTE — Patient Instructions (Signed)

## 2023-08-19 NOTE — ED Provider Notes (Signed)
 Pt was seen at [facility] ED today.  Patient has been experiencing increasing shortness of breath over the last 4 days.  Patient has noticed it with exertion and lying flat.  Patient also has noticed increasing peripheral edema.  Patient was noted to have elevated BNP persisting chronic kidney disease elevated troponins.  Pulmonary embolism was a concern.  Patient was sent to [facility] in order to get a VQ scan.  Patient may also benefit from echocardiogram and Doppler studies of the lower extremities considering his peripheral edema and dyspnea.  I will consult the medical service for admission  Case discussed with Dr Sherrill Randol Simmonds, MD 08/19/23 1704

## 2023-08-20 ENCOUNTER — Encounter (HOSPITAL_COMMUNITY): Payer: Self-pay | Admitting: Internal Medicine

## 2023-08-20 ENCOUNTER — Inpatient Hospital Stay (HOSPITAL_COMMUNITY)

## 2023-08-20 DIAGNOSIS — C9000 Multiple myeloma not having achieved remission: Secondary | ICD-10-CM

## 2023-08-20 DIAGNOSIS — N184 Chronic kidney disease, stage 4 (severe): Secondary | ICD-10-CM

## 2023-08-20 DIAGNOSIS — I5031 Acute diastolic (congestive) heart failure: Secondary | ICD-10-CM

## 2023-08-20 DIAGNOSIS — E119 Type 2 diabetes mellitus without complications: Secondary | ICD-10-CM

## 2023-08-20 DIAGNOSIS — R0600 Dyspnea, unspecified: Secondary | ICD-10-CM | POA: Diagnosis not present

## 2023-08-20 DIAGNOSIS — M7989 Other specified soft tissue disorders: Secondary | ICD-10-CM

## 2023-08-20 DIAGNOSIS — I5033 Acute on chronic diastolic (congestive) heart failure: Secondary | ICD-10-CM | POA: Diagnosis not present

## 2023-08-20 DIAGNOSIS — R0609 Other forms of dyspnea: Secondary | ICD-10-CM

## 2023-08-20 LAB — ECHOCARDIOGRAM COMPLETE
Area-P 1/2: 2.75 cm2
Height: 71 in
S' Lateral: 2.9 cm
Weight: 2720 [oz_av]

## 2023-08-20 LAB — COMPREHENSIVE METABOLIC PANEL WITH GFR
ALT: 18 U/L (ref 0–44)
AST: 24 U/L (ref 15–41)
Albumin: 3.1 g/dL — ABNORMAL LOW (ref 3.5–5.0)
Alkaline Phosphatase: 72 U/L (ref 38–126)
Anion gap: 9 (ref 5–15)
BUN: 48 mg/dL — ABNORMAL HIGH (ref 8–23)
CO2: 24 mmol/L (ref 22–32)
Calcium: 8.2 mg/dL — ABNORMAL LOW (ref 8.9–10.3)
Chloride: 104 mmol/L (ref 98–111)
Creatinine, Ser: 3.68 mg/dL — ABNORMAL HIGH (ref 0.61–1.24)
GFR, Estimated: 17 mL/min — ABNORMAL LOW (ref >60)
Glucose, Bld: 84 mg/dL (ref 70–99)
Potassium: 4.3 mmol/L (ref 3.5–5.1)
Sodium: 137 mmol/L (ref 135–145)
Total Bilirubin: 0.4 mg/dL (ref 0.0–1.2)
Total Protein: 6.9 g/dL (ref 6.5–8.1)

## 2023-08-20 LAB — URINALYSIS, ROUTINE W REFLEX MICROSCOPIC
Bilirubin Urine: NEGATIVE
Glucose, UA: NEGATIVE mg/dL
Hgb urine dipstick: NEGATIVE
Ketones, ur: NEGATIVE mg/dL
Leukocytes,Ua: NEGATIVE
Nitrite: NEGATIVE
Protein, ur: NEGATIVE mg/dL
Specific Gravity, Urine: 1.006 (ref 1.005–1.030)
pH: 6 (ref 5.0–8.0)

## 2023-08-20 LAB — CBC WITH DIFFERENTIAL/PLATELET
Abs Immature Granulocytes: 0.03 10*3/uL (ref 0.00–0.07)
Basophils Absolute: 0.1 10*3/uL (ref 0.0–0.1)
Basophils Relative: 1 %
Eosinophils Absolute: 0.2 10*3/uL (ref 0.0–0.5)
Eosinophils Relative: 4 %
HCT: 29.2 % — ABNORMAL LOW (ref 39.0–52.0)
Hemoglobin: 8.8 g/dL — ABNORMAL LOW (ref 13.0–17.0)
Immature Granulocytes: 1 %
Lymphocytes Relative: 18 %
Lymphs Abs: 0.9 10*3/uL (ref 0.7–4.0)
MCH: 22.9 pg — ABNORMAL LOW (ref 26.0–34.0)
MCHC: 30.1 g/dL (ref 30.0–36.0)
MCV: 76 fL — ABNORMAL LOW (ref 80.0–100.0)
Monocytes Absolute: 1 10*3/uL (ref 0.1–1.0)
Monocytes Relative: 19 %
Neutro Abs: 3.1 10*3/uL (ref 1.7–7.7)
Neutrophils Relative %: 57 %
Platelets: 182 10*3/uL (ref 150–400)
RBC: 3.84 MIL/uL — ABNORMAL LOW (ref 4.22–5.81)
RDW: 19.9 % — ABNORMAL HIGH (ref 11.5–15.5)
WBC: 5.3 10*3/uL (ref 4.0–10.5)
nRBC: 0 % (ref 0.0–0.2)

## 2023-08-20 LAB — GLUCOSE, CAPILLARY
Glucose-Capillary: 96 mg/dL (ref 70–99)
Glucose-Capillary: 160 mg/dL — ABNORMAL HIGH (ref 70–99)
Glucose-Capillary: 108 mg/dL — ABNORMAL HIGH (ref 70–99)
Glucose-Capillary: 92 mg/dL (ref 70–99)

## 2023-08-20 LAB — MAGNESIUM: Magnesium: 2.1 mg/dL (ref 1.7–2.4)

## 2023-08-20 LAB — BRAIN NATRIURETIC PEPTIDE: B Natriuretic Peptide: 195.8 pg/mL — ABNORMAL HIGH (ref 0.0–100.0)

## 2023-08-20 LAB — PHOSPHORUS: Phosphorus: 4.5 mg/dL (ref 2.5–4.6)

## 2023-08-20 MED ORDER — CHLORHEXIDINE GLUCONATE CLOTH 2 % EX PADS
6.0000 | MEDICATED_PAD | Freq: Every day | CUTANEOUS | Status: DC
  Administered 2023-08-20 – 2023-08-21 (×2): 6 via TOPICAL

## 2023-08-20 MED ORDER — SUMATRIPTAN SUCCINATE 100 MG PO TABS
100.0000 mg | ORAL_TABLET | Freq: Every day | ORAL | Status: DC | PRN
  Administered 2023-08-20: 100 mg via ORAL
  Filled 2023-08-20 (×2): qty 1

## 2023-08-20 MED ORDER — LEVALBUTEROL HCL 0.63 MG/3ML IN NEBU
0.6300 mg | INHALATION_SOLUTION | Freq: Three times a day (TID) | RESPIRATORY_TRACT | Status: DC
  Administered 2023-08-20 – 2023-08-21 (×3): 0.63 mg via RESPIRATORY_TRACT
  Filled 2023-08-20 (×3): qty 3

## 2023-08-20 MED ORDER — IPRATROPIUM BROMIDE 0.02 % IN SOLN
0.5000 mg | Freq: Three times a day (TID) | RESPIRATORY_TRACT | Status: DC
  Administered 2023-08-20 – 2023-08-21 (×3): 0.5 mg via RESPIRATORY_TRACT
  Filled 2023-08-20 (×3): qty 2.5

## 2023-08-20 MED ORDER — SODIUM CHLORIDE 0.9% FLUSH: 10.0000 mL | INTRAVENOUS | Status: DC | PRN

## 2023-08-20 MED ORDER — SODIUM CHLORIDE 0.9% FLUSH
10.0000 mL | Freq: Two times a day (BID) | INTRAVENOUS | Status: DC
  Administered 2023-08-20 – 2023-08-21 (×2): 10 mL

## 2023-08-20 NOTE — Hospital Course (Addendum)
 [name] is a 73 y.o. male with medical history significant for but not limited to anxiety, history of anemia/iron  deficiency and anemia renal disease, diabetes mellitus type 2, GERD, history of multiple myeloma, hypertension, hyperlipidemia, CKD stage IV and other comorbidities who presents with dyspnea which is progressed over the last 3 to 4 days.  Patient states that he became more short of breath and it is difficult for him to lay flat as well as ambulate without getting short of breath.  Also notices that his legs are extremely swollen and states that they have been worsening in the last 3 to 4 days as well.  Denies any chest pain.  No nausea or vomiting, lightheadedness or dizziness.  Was recently evaluated by his oncologist today and was sent to the ED for further evaluation.  [facility] was asked to admit this patient for dyspnea and volume overload and current working Dx is Acute on Chronic Diastolic CHF Exacerbation. Cardiology consulted for further evaluation and management.   Assessment and Plan:  Dyspnea and lower extremity swelling in the setting of Acute on Chronic Diastolic CHF: Admit to cardiac telemetry and check echocardiogram and initiate diuresis.  Given 40 mg of IV Lasix  in ED and will continue twice daily dosing of IV Lasix  40 mg BID.  Will need strict I's and O's and daily weights. Pro-BNP was 1003.0. BNP of 195.8. Has a history of multiple myeloma so may have some cardiac involvement.  Rule out nephrotic syndrome and checked UA and Negative.  CTA PE protocol not able to be safely done given his renal function so a VQ scan was done which was negative.  Chest x-ray 2 views did show some vascular congestion yesterday.  Will continue with carvedilol  12.5 mg p.o. twice daily.  Hold his home diuretics of torsemide .  Add Xopenex  and Atrovent  and continue Breo Ellipta .  Since the extremities are swollen we will check lower extremity venous duplex to rule out DVT and this showed no VTE.  Cardiology Consulted for Further evaluation and recommendations    IgG Kappa Multiple Myeloma: Not in remission and sees Dr. Timmy.  Is currently off of treatment due to poor tolerance and patient preference.   AKI on CKD Stage IV / Metabolic Acidosis: BUN/Cr Trend: Recent Labs  Lab 07/29/23 0759 08/19/23 0934 08/19/23 0940 08/19/23 0940 08/20/23 0745 08/21/23 0710  BUN 43* 58* 55*  --  48* 50*  CREATININE 3.96* 3.99* 3.95*   < > 3.68* 3.41*   < > = values in this interval not displayed.  -C/w diuresis as above -Avoid Nephrotoxic Medications, Contrast Dyes, Hypotension and Dehydration to Ensure Adequate Renal Perfusion and will need to Renally Adjust Meds -Continue to Monitor and Trend Renal Function carefully and repeat CMP in the AM    Chronic Pain from MM: resume Home Pain regimen   Anemia of Chronic Renal disease/ Microcytic Anemia / Iron  deficiency Anemia: Hgb/Hct Trend:  Recent Labs  Lab 07/29/23 0759 08/19/23 0934 08/19/23 0940 08/20/23 0745 08/21/23 0710  HGB 9.2* 8.8* 8.9* 8.8* 9.3*  HCT 30.4* 29.1* 29.1* 29.2* 29.9*  MCV 76.8* 75.0* 75.2* 76.0* 75.5*  -Check Anemia Panel in the AM. CTM for S/Sx of Volume Overload. Repeat CBC in the AM   Diabetes Mellitus type II with complication of Diabetic Neuropathy: Open A1c was 5.2.  Continue to monitor CBGs AC and at bedtime with sensitive NovoLog /scale insulin  AC and at bedtime.  Continue gabapentin  300 mg p.o. 3 times daily. CBGs ranging from 96-160  Hypoalbuminemia: Patient's Albumin  Level went from 3.9 -> 3.1. CTM and Trend and repeat CMP in the AM  Headache/Migraine: Resume Sumatriptan 100 mg PRN

## 2023-08-20 NOTE — Consult Note (Addendum)
 Cardiology Consultation   Patient ID: [name] MRN: [id number]; DOB: Sep 04, 1950  Admit date: 08/19/2023 Date of Consult: 08/20/2023  PCP:  Sim Emery CROME, MD   [facility] Providers Cardiologist:  New  Patient Profile: [name] is a 73 y.o. male with a hx of anxiety, history of anemia/iron  deficiency and anemia renal disease, diabetes mellitus type 2, GERD, history of multiple myeloma, hypertension, CKD stage IV  who is being seen 08/20/2023 for the evaluation of acute on chronic HFpEF at the request of Dr. Marygrace.  History of Present Illness: [name] has past medical history as listed above. He presented to [facility] on 08/19/2023 complaining of shortness of breath. He reports having shortness of breath with exertion x 4 days with some shortness of breath at rest as well. Reports associated orthopnea and LE edema. Patient was upstairs getting chemotherapy and became short of breath which led to him going downstairs for further evaluation.   Relevant workup in the ED/since admitted includes: stable anemia on CBC, creatinine slightly elevated above baseline 3.95 > 3.68, ProBNP 1,003, BNP 195, troponin 137 > 116, CXR showed no acute disease, VQ scan was negative, echo showed EF 65-70%, G1DD, normal RV function, biatrial enlargement, small pericardial effusion, dilated IVC, LE doppler showed no DVTs just edema from knee to ankle.   After being transferred to [facility] he was admitted to the medicine service. Cardiology was asked to consult in the setting of acute on chronic HFpEF. Patient has been started on IV Lasix  40 mg BID and Toprol  25 mg daily.   In an outpatient setting he is followed closely by nephrology, oncology and podiatry. He was last seen by his nephrologist, Dr. Carlette thru [facility], on 06/10/2023. His nephrologist has been primarily managing his HTN, CKD and fluid management. At this appointment his renal function, BP and volume status  were stable and he was set to follow back up in 3 months (July of this year). His current medicine regimen as per this visit note was, amlodipine  5 mg BID, Losartan  25 mg daily, Toprol  25 mg daily, torsemide  20 mg BID. At this appointment he weight 169 lb with BP 106/63  After speaking with the patient, he states that on the day of his arrival to the emergency department he was driving to chemotherapy and experienced a lightheaded episode in the car.  He denies any other previous episodes similar to this.  He waited in his car around 2 to 3 minutes before continuing to drive to the cancer center.  He stated that he was extremely short of breath when trying to walk in. The shortness of breath had been occurring for the last couple of days however it had become significantly worse the day of his appointment.  When he arrived at his chemotherapy appointment they instructed that he go to the emergency department to get worked up for shortness of breath.    He states that he has been taking has been medications as instructed and denies any missed doses.  He does not check daily weights at home however he does not feel that he gained a significant amount of weight recently.  He reports lower extremity edema and the inability to lay flat comfortably.   Past Medical History:  Diagnosis Date   Anemia of renal disease 03/12/2011   Anemia, iron  deficiency 03/12/2011   Anxiety disorder    Arthritis    Diabetes mellitus without complication (HCC)    Diabetic neuropathy (  HCC)    Diverticulosis    Fibromyalgia    GERD (gastroesophageal reflux disease)    Hemorrhoids    Herpes zoster    History of COVID-19 04/03/2019   Hyperlipidemia    Hypertension    Hypotestosteronism 03/17/2012   Multiple myeloma (HCC)    Myeloma (HCC) 12/31/2010   Type 2 diabetes mellitus (HCC)    Past Surgical History:  Procedure Laterality Date   BALLOON DILATION N/A 05/16/2021   Procedure: BALLOON DILATION;  Surgeon: Charlanne Groom, MD;  Location: [facility] ENDOSCOPY;  Service: Gastroenterology;  Laterality: N/A;   BIOPSY  05/16/2021   Procedure: BIOPSY;  Surgeon: Charlanne Groom, MD;  Location: [facility] ENDOSCOPY;  Service: Gastroenterology;;   CATARACT EXTRACTION     bi-lateral   COLONOSCOPY  07/13/2011   Procedure: COLONOSCOPY;  Surgeon: Gwendlyn ONEIDA Buddy, MD,FACG;  Location: [facility] ENDOSCOPY;  Service: Endoscopy;  Laterality: N/A;   CYST EXCISION Right 08/29/2018   Procedure: CYST REMOVAL;  Surgeon: Gail Favorite, MD;  Location: [facility];  Service: General;  Laterality: Right;   ESOPHAGOGASTRODUODENOSCOPY (EGD) WITH PROPOFOL  N/A 05/16/2021   Procedure: ESOPHAGOGASTRODUODENOSCOPY (EGD) WITH PROPOFOL ;  Surgeon: Charlanne Groom, MD;  Location: [facility] ENDOSCOPY;  Service: Gastroenterology;  Laterality: N/A;   IR CHEST FLUORO  09/22/2016   IR CHEST FLUORO  04/25/2020   IR FLUORO GUIDE PORT INSERTION RIGHT  09/23/2016   IR REMOVAL TUN ACCESS W/ PORT W/O FL MOD SED  09/23/2016   IR US  GUIDE VASC ACCESS LEFT  09/23/2016   KNEE ARTHROSCOPY     left   KNEE SURGERY     MASS EXCISION Right 05/09/2020   Procedure: RIGHT LONG FINGER EXCISION MASS,;  Surgeon: Murrell Drivers, MD;  Location: [facility];  Service: Orthopedics;  Laterality: Right;   NEVUS EXCISION N/A 08/29/2018   Procedure: , NEVUS LEFT ELBOW;  Surgeon: Gail Favorite, MD;  Location: [facility];  Service: General;  Laterality: N/A;   POLYPECTOMY  05/16/2021   Procedure: POLYPECTOMY;  Surgeon: Charlanne Groom, MD;  Location: [facility] ENDOSCOPY;  Service: Gastroenterology;;   PORTACATH PLACEMENT     POSTERIOR CERVICAL FUSION/FORAMINOTOMY N/A 06/05/2016   Procedure: LAMINECTOMY CERVICAL 3 - CERVICAL 6 WITH LATERAL MASS FUSION;  Surgeon: Gerldine Maizes, MD;  Location: [facility] OR;  Service: Neurosurgery;  Laterality: N/A;  LAMINECTOMY CERVICAL 3 - CERVICAL 6 WITH LATERAL MASS FUSION   TONSILLECTOMY     TRANSURETHRAL RESECTION OF PROSTATE  01/2011    Dr. at [facility] in [place]    Home Medications:  Prior to Admission medications   Medication Sig Start Date End Date Taking? Authorizing Provider  amLODipine  (NORVASC ) 10 MG tablet Take 10 mg by mouth daily.   Yes [provider]  amLODipine  (NORVASC ) 5 MG tablet Take 5 mg by mouth 2 (two) times daily.   Yes [provider]  gabapentin  (NEURONTIN ) 300 MG capsule TAKE 1 CAPSULE BY MOUTH THREE TIMES A DAY 07/20/19  Yes Franchot Lauraine HERO, NP  JANUVIA 100 MG tablet Take 100 mg by mouth daily. 09/30/22  Yes [provider]  metoprolol  succinate (TOPROL -XL) 25 MG 24 hr tablet Take 25 mg by mouth daily. 07/28/19  Yes [provider]  morphine  (MS CONTIN ) 30 MG 12 hr tablet Take 30 mg by mouth 3 (three) times daily. 06/29/18  Yes [provider]  omeprazole  (PRILOSEC) 40 MG capsule TAKE 1 CAPSULE BY MOUTH TWICE A DAY Patient taking differently: Take 40 mg by mouth daily. 10/04/19  Yes Timmy Maude SAUNDERS, MD  oxyCODONE -acetaminophen  (PERCOCET) 10-325 MG tablet Take 1 tablet by mouth every 4 (four) hours.   Yes [provider]  sildenafil (VIAGRA) 100 MG tablet Take 100 mg by mouth daily as needed for erectile dysfunction. 07/14/22  Yes [provider]  SUMAtriptan (IMITREX) 100 MG tablet Take 100 mg by mouth daily as needed for migraine. 08/22/19  Yes [provider]  torsemide  (DEMADEX ) 20 MG tablet Take 20 mg by mouth 2 (two) times daily. 01/12/19  Yes [provider]  Vitamin D , Ergocalciferol , (DRISDOL ) 50000 UNITS CAPS capsule Take 50,000 Units by mouth every Saturday.    Yes [provider]  amoxicillin  (AMOXIL ) 500 MG capsule Take 500 mg by mouth 2 (two) times daily. Patient not taking: Reported on 08/19/2023 08/02/23   [provider]  potassium chloride  (KLOR-CON ) 20 MEQ packet Take 20 mEq by mouth daily.   05/17/11  [provider]   Scheduled Meds:  Chlorhexidine  Gluconate Cloth  6 each Topical  Daily   dicyclomine   10 mg Oral TID AC   furosemide   40 mg Intravenous Q12H   gabapentin   300 mg Oral TID   heparin   5,000 Units Subcutaneous Q8H   insulin  aspart  0-5 Units Subcutaneous QHS   insulin  aspart  0-9 Units Subcutaneous TID WC   ipratropium  0.5 mg Nebulization TID   levalbuterol   0.63 mg Nebulization TID   Living Better with Heart Failure Book   Does not apply Once   metoprolol  succinate  25 mg Oral Daily   morphine   30 mg Oral BID   sodium chloride  flush  10-40 mL Intracatheter Q12H   Continuous Infusions:  PRN Meds: acetaminophen  **OR** acetaminophen , ammonium lactate , hydrALAZINE , ondansetron  **OR** ondansetron  (ZOFRAN ) IV, oxyCODONE , oxyCODONE -acetaminophen  **AND** oxyCODONE , prochlorperazine , sodium chloride  flush, SUMAtriptan  Allergies:    Allergies  Allergen Reactions   Iodinated Contrast Media Rash    Patient states he was instructed not to take IV contrast.  In 2008 he had an unknown reaction, and was told not to take it again.  He was also told not to take it due to his kidneys.   Deltasone [Prednisone] Other (See Comments)    Unknown reaction   Ioversol Other (See Comments)    Unknown reaction   Iodine Anxiety, Rash and Other (See Comments)    Didn't feel right instructed not to take per MD--something with his port    Social History:   Social History   Socioeconomic History   Marital status: Divorced    Spouse name: Not on file   Number of children: 3   Years of education: Not on file   Highest education level: Not on file  Occupational History   Occupation: retired    Associate Professor: [employer]  Tobacco Use   Smoking status: Never   Smokeless tobacco: Never  Vaping Use   Vaping status: Never Used  Substance and Sexual Activity   Alcohol  use: Not Currently    Alcohol /week: 0.0 standard drinks of alcohol    Drug use: Not Currently    Comment: nothing in twenty years   Sexual activity: Yes  Other Topics Concern   Not on file  Social History  Narrative   Lives alone in a one story home. Has 3 children.  Retired Financial risk analyst.  Education: 12 grade.       Social Drivers of Corporate investment banker Strain: Not on file  Food Insecurity: Food Insecurity Present (08/20/2023)   Hunger Vital Sign  Worried About Programme researcher, broadcasting/film/video in the Last Year: Sometimes true    [employer] of Food in the Last Year: Sometimes true  Transportation Needs: No Transportation Needs (08/20/2023)   PRAPARE - Administrator, Civil Service (Medical): No    Lack of Transportation (Non-Medical): No  Physical Activity: Not on file  Stress: Not on file  Social Connections: Moderately Integrated (08/20/2023)   Social Connection and Isolation Panel    Frequency of Communication with Friends and Family: More than three times a week    Frequency of Social Gatherings with Friends and Family: Twice a week    Attends Religious Services: More than 4 times per year    Active Member of [employer] or Organizations: Yes    Attends Engineer, structural: More than 4 times per year    Marital Status: Divorced  Intimate Partner Violence: Not At Risk (08/20/2023)   Humiliation, Afraid, Rape, and Kick questionnaire    Fear of Current or Ex-Partner: No    Emotionally Abused: No    Physically Abused: No    Sexually Abused: No    Family History:   Family History  Problem Relation Age of Onset   Breast cancer Sister    Breast cancer Mother    Heart disease Mother    Diabetes Sister    Bone cancer Father    Heart disease Sister    Heart disease Daughter    Throat cancer Brother    Stomach cancer Brother    Colon cancer Neg Hx     ROS:  Please see the history of present illness.  All other ROS reviewed and negative.     Physical Exam/Data: Vitals:   08/20/23 0752 08/20/23 0800 08/20/23 0903 08/20/23 1100  BP: 130/63   131/60  Pulse: (!) 52  64 65  Resp: 17  18 16   Temp: 98.4 F (36.9 C)   97.8 F (36.6 C)  TempSrc: Oral   Oral  SpO2:  100%     Weight:      Height:        Intake/Output Summary (Last 24 hours) at 08/20/2023 1551 Last data filed at 08/20/2023 1500 Gross per 24 hour  Intake 1560 ml  Output 2875 ml  Net -1315 ml      08/20/2023    3:57 AM 08/19/2023    9:08 PM 08/19/2023    6:57 PM  Last 3 Weights  Weight (lbs) 170 lb 172 lb 1.6 oz 169 lb 15.6 oz  Weight (kg) 77.111 kg 78.064 kg 77.1 kg     Body mass index is 23.71 kg/m.   General:  Well nourished, well developed, in no acute distress HEENT: normal Neck: no JVD Vascular:  Distal pulses 2+ bilaterally Cardiac:  normal S1, S2; RRR; no murmur  Lungs: Diminished breath sounds bilaterally Abd: soft, nontender, no hepatomegaly  Ext: 1+ LE edema Musculoskeletal:  No deformities, BUE and BLE strength normal and equal Skin: warm and dry  Neuro:  no focal abnormalities noted Psych:  Normal affect   EKG:  The EKG was personally reviewed and demonstrates:  sinus rhythm, HR 78  Telemetry:  Telemetry was personally reviewed and demonstrates:  sinus rhythm, HR 70s, infrequent PVCs  Relevant CV Studies: Echocardiogram, 08/20/2023 Left ventricular ejection fraction, by estimation, is 65 to 70% . The left ventricle has normal function. The left ventricle has no regional wall motion abnormalities. There is moderate concentric left ventricular hypertrophy. Left ventricular diastolic parameters are  consistent with Grade I diastolic dysfunction ( impaired relaxation) .  Right ventricular systolic function is normal. The right ventricular size is normal. Left atrial size was mildly dilated.  Right atrial size was mildly dilated. A small pericardial effusion is present. The pericardial effusion is posterior to the left ventricle and localized near the right atrium. There is no evidence of cardiac tamponade.  The mitral valve is normal in structure. Trivial mitral valve regurgitation. No evidence of mitral stenosis.  The aortic valve is tricuspid. There is mild calcification  of the aortic valve. Aortic valve regurgitation is not visualized. No aortic stenosis is present.  The inferior vena cava is dilated in size with < 50% respiratory variability, suggesting right atrial pressure of 15 mmHg.  Laboratory Data: High Sensitivity Troponin:  No results for input(s): TROPONINIHS in the last 720 hours.   Chemistry Recent Labs  Lab 08/19/23 0934 08/19/23 0940 08/20/23 0745  NA 137 137 137  K 4.5 4.7 4.3  CL 104 101 104  CO2 24 20* 24  GLUCOSE 90 91 84  BUN 58* 55* 48*  CREATININE 3.99* 3.95* 3.68*  CALCIUM  8.4* 8.5* 8.2*  MG  --   --  2.1  GFRNONAA 15* 15* 17*  ANIONGAP 9 0* 9    Recent Labs  Lab 08/19/23 0934 08/20/23 0745  PROT 7.2 6.9  ALBUMIN  3.9 3.1*  AST 20 24  ALT 15 18  ALKPHOS 78 72  BILITOT 0.3 0.4   Lipids No results for input(s): CHOL, TRIG, HDL, LABVLDL, LDLCALC, CHOLHDL in the last 168 hours.  Hematology Recent Labs  Lab 08/19/23 0934 08/19/23 0940 08/20/23 0745  WBC 6.3 6.4 5.3  RBC 3.88* 3.87* 3.84*  HGB 8.8* 8.9* 8.8*  HCT 29.1* 29.1* 29.2*  MCV 75.0* 75.2* 76.0*  MCH 22.7* 23.0* 22.9*  MCHC 30.2 30.6 30.1  RDW 19.8* 19.8* 19.9*  PLT 188 192 182   Thyroid  No results for input(s): TSH, FREET4 in the last 168 hours.  BNP Recent Labs  Lab 08/19/23 0940 08/20/23 0746  BNP  --  195.8*  PROBNP 1,003.0*  --     DDimer No results for input(s): DDIMER in the last 168 hours.  Radiology/Studies:  VAS US  LOWER EXTREMITY VENOUS (DVT) (7a-7p) Result Date: 08/20/2023  Lower Venous DVT Study Patient Name:  DEONDRE MARINARO  Date of Exam:   08/20/2023 Medical Rec #: [id number]       Accession #:    [id number] Date of Birth: 11/13/1950       Patient Gender: M Patient Age:   73 years Exam Location:  [facility] Procedure:      VAS US  LOWER EXTREMITY VENOUS (DVT) Referring Phys: ALEJANDRO MARKER --------------------------------------------------------------------------------  Indications: Edema.  Limitations: Poor  ultrasound/tissue interface. Comparison Study: Previous exams on 10/20/2019 & 07/04/2018 were negative for DVT Performing Technologist: Ezzie Potters RVT, RDMS  Examination Guidelines: A complete evaluation includes B-mode imaging, spectral Doppler, color Doppler, and power Doppler as needed of all accessible portions of each vessel. Bilateral testing is considered an integral part of a complete examination. Limited examinations for reoccurring indications may be performed as noted. The reflux portion of the exam is performed with the patient in reverse Trendelenburg.  +---------+---------------+---------+-----------+----------+--------------+ RIGHT    CompressibilityPhasicitySpontaneityPropertiesThrombus Aging +---------+---------------+---------+-----------+----------+--------------+ CFV      Full           No       Yes                                 +---------+---------------+---------+-----------+----------+--------------+  SFJ      Full                                                        +---------+---------------+---------+-----------+----------+--------------+ FV Prox  Full           Yes      Yes                                 +---------+---------------+---------+-----------+----------+--------------+ FV Mid   Full           Yes      Yes                                 +---------+---------------+---------+-----------+----------+--------------+ FV DistalFull           Yes      Yes                                 +---------+---------------+---------+-----------+----------+--------------+ PFV      Full                                                        +---------+---------------+---------+-----------+----------+--------------+ POP      Full           Yes      Yes                                 +---------+---------------+---------+-----------+----------+--------------+ PTV      Full                                                         +---------+---------------+---------+-----------+----------+--------------+ PERO     Full                                                        +---------+---------------+---------+-----------+----------+--------------+   +---------+---------------+---------+-----------+----------+--------------+ LEFT     CompressibilityPhasicitySpontaneityPropertiesThrombus Aging +---------+---------------+---------+-----------+----------+--------------+ CFV      Full           Yes      Yes                                 +---------+---------------+---------+-----------+----------+--------------+ SFJ      Full                                                        +---------+---------------+---------+-----------+----------+--------------+  FV Prox  Full           Yes      Yes                                 +---------+---------------+---------+-----------+----------+--------------+ FV Mid   Full           Yes      Yes                                 +---------+---------------+---------+-----------+----------+--------------+ FV DistalFull           Yes      Yes                                 +---------+---------------+---------+-----------+----------+--------------+ PFV      Full                                                        +---------+---------------+---------+-----------+----------+--------------+ POP      Full           Yes      Yes                                 +---------+---------------+---------+-----------+----------+--------------+ PTV      Full                                                        +---------+---------------+---------+-----------+----------+--------------+ PERO     Full                                                        +---------+---------------+---------+-----------+----------+--------------+     Summary: BILATERAL: - No evidence of deep vein thrombosis seen in the lower extremities, bilaterally. -No evidence of  popliteal cyst, bilaterally. -Subcutaneous edema from knee to ankle, bilaterally.  *See table(s) above for measurements and observations. Electronically signed by Norman Serve on 08/20/2023 at 12:52:47 PM.    Final    ECHOCARDIOGRAM COMPLETE Result Date: 08/20/2023    ECHOCARDIOGRAM REPORT   Patient Name:   Puneet Masoner Date of Exam: 08/20/2023 Medical Rec #:  [id number]      Height:       71.0 in Accession #:    [id number]     Weight:       170.0 lb Date of Birth:  September 19, 1950      BSA:          1.968 m Patient Age:    73 years       BP:           130/63 mmHg Patient Gender: M              HR:           65  bpm. Exam Location:  Inpatient Procedure: 2D Echo, Cardiac Doppler and Color Doppler (Both Spectral and Color            Flow Doppler were utilized during procedure). Indications:    Dyspnea  History:        Patient has prior history of Echocardiogram examinations, most                 recent 11/25/2020. Signs/Symptoms:Dyspnea; Risk Factors:Diabetes.  Sonographer:    Philomena Daring Referring Phys: SHERRILL CABLE, LATIF IMPRESSIONS  1. Left ventricular ejection fraction, by estimation, is 65 to 70%. The left ventricle has normal function. The left ventricle has no regional wall motion abnormalities. There is moderate concentric left ventricular hypertrophy. Left ventricular diastolic parameters are consistent with Grade I diastolic dysfunction (impaired relaxation).  2. Right ventricular systolic function is normal. The right ventricular size is normal.  3. Left atrial size was mildly dilated.  4. Right atrial size was mildly dilated.  5. A small pericardial effusion is present. The pericardial effusion is posterior to the left ventricle and localized near the right atrium. There is no evidence of cardiac tamponade.  6. The mitral valve is normal in structure. Trivial mitral valve regurgitation. No evidence of mitral stenosis.  7. The aortic valve is tricuspid. There is mild calcification of the aortic valve. Aortic  valve regurgitation is not visualized. No aortic stenosis is present.  8. The inferior vena cava is dilated in size with <50% respiratory variability, suggesting right atrial pressure of 15 mmHg. FINDINGS  Left Ventricle: Left ventricular ejection fraction, by estimation, is 65 to 70%. The left ventricle has normal function. The left ventricle has no regional wall motion abnormalities. The left ventricular internal cavity size was normal in size. There is  moderate concentric left ventricular hypertrophy. Left ventricular diastolic parameters are consistent with Grade I diastolic dysfunction (impaired relaxation). Right Ventricle: The right ventricular size is normal. No increase in right ventricular wall thickness. Right ventricular systolic function is normal. Left Atrium: Left atrial size was mildly dilated. Right Atrium: Right atrial size was mildly dilated. Pericardium: A small pericardial effusion is present. The pericardial effusion is posterior to the left ventricle and localized near the right atrium. There is no evidence of cardiac tamponade. Mitral Valve: The mitral valve is normal in structure. Trivial mitral valve regurgitation. No evidence of mitral valve stenosis. Tricuspid Valve: The tricuspid valve is normal in structure. Tricuspid valve regurgitation is trivial. No evidence of tricuspid stenosis. Aortic Valve: The aortic valve is tricuspid. There is mild calcification of the aortic valve. Aortic valve regurgitation is not visualized. No aortic stenosis is present. Pulmonic Valve: The pulmonic valve was normal in structure. Pulmonic valve regurgitation is not visualized. No evidence of pulmonic stenosis. Aorta: The aortic root is normal in size and structure. Venous: The inferior vena cava is dilated in size with less than 50% respiratory variability, suggesting right atrial pressure of 15 mmHg. IAS/Shunts: No atrial level shunt detected by color flow Doppler.  LEFT VENTRICLE PLAX 2D LVIDd:          4.80 cm   Diastology LVIDs:         2.90 cm   LV e' medial:    7.51 cm/s LV PW:         1.30 cm   LV E/e' medial:  10.8 LV IVS:        1.30 cm   LV e' lateral:   8.59 cm/s LVOT diam:  2.20 cm   LV E/e' lateral: 9.5 LV SV:         127 LV SV Index:   64 LVOT Area:     3.80 cm  RIGHT VENTRICLE             IVC RV S prime:     15.20 cm/s  IVC diam: 2.40 cm TAPSE (M-mode): 2.8 cm LEFT ATRIUM             Index        RIGHT ATRIUM           Index LA diam:        3.50 cm 1.78 cm/m   RA Area:     20.10 cm LA Vol (A2C):   67.6 ml 34.35 ml/m  RA Volume:   55.50 ml  28.20 ml/m LA Vol (A4C):   46.7 ml 23.73 ml/m LA Biplane Vol: 58.9 ml 29.93 ml/m  AORTIC VALVE LVOT Vmax:   129.00 cm/s LVOT Vmean:  90.500 cm/s LVOT VTI:    0.333 m  AORTA Ao Root diam: 3.00 cm Ao Asc diam:  3.50 cm MITRAL VALVE MV Area (PHT): 2.75 cm    SHUNTS MV Decel Time: 276 msec    Systemic VTI:  0.33 m MV E velocity: 81.20 cm/s  Systemic Diam: 2.20 cm MV A velocity: 92.80 cm/s MV E/A ratio:  0.88 Toribio Fuel MD Electronically signed by Toribio Fuel MD Signature Date/Time: 08/20/2023/10:00:01 AM    Final    X-ray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

## 2023-08-20 NOTE — Progress Notes (Signed)
 PROGRESS NOTE    [name]  FMW:[id number] DOB: Jul 07, 1950 DOA: 08/19/2023 PCP: Sim Emery CROME, MD   Brief Narrative:  [name] is a 73 y.o. male with medical history significant for but not limited to anxiety, history of anemia/iron  deficiency and anemia renal disease, diabetes mellitus type 2, GERD, history of multiple myeloma, hypertension, hyperlipidemia, CKD stage IV and other comorbidities who presents with dyspnea which is progressed over the last 3 to 4 days.  Patient states that he became more short of breath and it is difficult for him to lay flat as well as ambulate without getting short of breath.  Also notices that his legs are extremely swollen and states that they have been worsening in the last 3 to 4 days as well.  Denies any chest pain.  No nausea or vomiting, lightheadedness or dizziness.  Was recently evaluated by his oncologist today and was sent to the ED for further evaluation.  TRH was asked to admit this patient for dyspnea and volume overload and current working Dx is Acute on Chronic Diastolic CHF Exacerbation. Cardiology consulted for further evaluation and management.   Assessment and Plan:  Dyspnea and lower extremity swelling in the setting of Acute on Chronic Diastolic CHF: Admit to cardiac telemetry and check echocardiogram and initiate diuresis.  Given 40 mg of IV Lasix  in ED and will continue twice daily dosing of IV Lasix  40 mg BID.  Will need strict I's and O's and daily weights. Pro-BNP was 1003.0. BNP of 195.8. Has a history of multiple myeloma so may have some cardiac involvement.  Rule out nephrotic syndrome and checked UA and Negative.  CTA PE protocol not able to be safely done given his renal function so a VQ scan was done which was negative.  Chest x-ray 2 views did show some vascular congestion yesterday.  Will continue with carvedilol  12.5 mg p.o. twice daily.  Hold his home diuretics of torsemide .  Add Xopenex  and Atrovent  and continue Breo  Ellipta.  Since the extremities are swollen we will check lower extremity venous duplex to rule out DVT and this showed no VTE. Cardiology Consulted for Further evaluation and recommendations    IgG Kappa Multiple Myeloma: Not in remission and sees Dr. Timmy.  Is currently off of treatment due to poor tolerance and patient preference.   AKI on CKD Stage IV / Metabolic Acidosis: BUN/Cr Trend: Recent Labs  Lab 07/22/23 0745 07/29/23 0759 08/19/23 0934 08/19/23 0940 08/19/23 0940 08/20/23 0745  BUN 39* 43* 58* 55*  --  48*  CREATININE 3.45* 3.96* 3.99* 3.95*   < > 3.68*   < > = values in this interval not displayed.  -C/w diuresis as above -Avoid Nephrotoxic Medications, Contrast Dyes, Hypotension and Dehydration to Ensure Adequate Renal Perfusion and will need to Renally Adjust Meds -Continue to Monitor and Trend Renal Function carefully and repeat CMP in the AM    Chronic Pain from MM: resume Home Pain regimen   Anemia of Chronic Renal disease/ Microcytic Anemia / Iron  deficiency Anemia: Hgb/Hct Trend:  Recent Labs  Lab 07/22/23 0745 07/29/23 0759 08/19/23 0934 08/19/23 0940 08/20/23 0745  HGB 8.9* 9.2* 8.8* 8.9* 8.8*  HCT 29.5* 30.4* 29.1* 29.1* 29.2*  MCV 78.2* 76.8* 75.0* 75.2* 76.0*  -Check Anemia Panel in the AM. CTM for S/Sx of Volume Overload. Repeat CBC in the AM   Diabetes Mellitus type II with complication of Diabetic Neuropathy: Open A1c was 5.2.  Continue to monitor CBGs AC and  at bedtime with sensitive NovoLog /scale insulin  AC and at bedtime.  Continue gabapentin  300 mg p.o. 3 times daily. CBGs ranging from 96-160  Hypoalbuminemia: Patient's Albumin  Level went from 3.9 -> 3.1. CTM and Trend and repeat CMP in the AM  Headache/Migraine: Resume Sumatriptan 100 mg PRN   DVT prophylaxis: heparin  injection 5,000 Units Start: 08/19/23 2200 SCDs Start: 08/19/23 1902    Code Status: Full Code Family Communication: No family present @ bedside  Disposition Plan:   Level of care: Telemetry Cardiac Status is: Inpatient Remains inpatient appropriate because: Needs further clinical improvement and clearance by the Specialists   Consultants:  Cardiology  Procedures:  ECHO LE Venous Duplex V/Q Scan  Antimicrobials:  Anti-infectives (From admission, onward)    None       Subjective: Seen and examined at bedside and thinks he is doing little bit better but continues to be short of breath.  Also complained of a headache earlier.  No nausea or vomiting.  No other concerns or complaints at this time and leg swelling is still persistent.  Objective: Vitals:   08/20/23 0752 08/20/23 0800 08/20/23 0903 08/20/23 1100  BP: 130/63   131/60  Pulse: (!) 52  64 65  Resp: 17  18 16   Temp: 98.4 F (36.9 C)   97.8 F (36.6 C)  TempSrc: Oral   Oral  SpO2:  100%    Weight:      Height:        Intake/Output Summary (Last 24 hours) at 08/20/2023 1347 Last data filed at 08/20/2023 1300 Gross per 24 hour  Intake 1200 ml  Output 2275 ml  Net -1075 ml   Filed Weights   08/19/23 1857 08/19/23 2108 08/20/23 0357  Weight: 77.1 kg 78.1 kg 77.1 kg   Examination: Physical Exam:  Constitutional: WN/WD elderly African-American male in no acute distress appears calm Respiratory: Diminished to auscultation bilaterally with some coarse breath sounds and has some slight crackles but no appreciable wheezing, rales or rhonchi. Normal respiratory effort and patient is not tachypenic. No accessory muscle use.  Cardiovascular: RRR, no murmurs / rubs / gallops. S1 and S2 auscultated.  Has 2+ lower extremity edema Abdomen: Soft, non-tender, distended secondary body habitus.  Bowel sounds positive.  GU: Deferred. Musculoskeletal: No clubbing / cyanosis of digits/nails. No joint deformity upper and lower extremities.  Skin: No rashes, lesions, ulcers on limited skin evaluation. No induration; Warm and dry.  Neurologic: CN 2-12 grossly intact with no focal deficits.  Romberg and sign cerebellar reflexes not assessed.  Psychiatric: Normal judgment and insight. Alert and oriented x 3. Normal mood and appropriate affect.   Data Reviewed: I have personally reviewed following labs and imaging studies  CBC: Recent Labs  Lab 08/19/23 0934 08/19/23 0940 08/20/23 0745  WBC 6.3 6.4 5.3  NEUTROABS 4.1  --  3.1  HGB 8.8* 8.9* 8.8*  HCT 29.1* 29.1* 29.2*  MCV 75.0* 75.2* 76.0*  PLT 188 192 182   Basic Metabolic Panel: Recent Labs  Lab 08/19/23 0934 08/19/23 0940 08/20/23 0745  NA 137 137 137  K 4.5 4.7 4.3  CL 104 101 104  CO2 24 20* 24  GLUCOSE 90 91 84  BUN 58* 55* 48*  CREATININE 3.99* 3.95* 3.68*  CALCIUM  8.4* 8.5* 8.2*  MG  --   --  2.1  PHOS  --   --  4.5   GFR: Estimated Creatinine Clearance: 18.8 mL/min (A) (by C-G formula based on SCr of 3.68 mg/dL (H)).  Liver Function Tests: Recent Labs  Lab 08/19/23 0934 08/20/23 0745  AST 20 24  ALT 15 18  ALKPHOS 78 72  BILITOT 0.3 0.4  PROT 7.2 6.9  ALBUMIN  3.9 3.1*   No results for input(s): LIPASE, AMYLASE in the last 168 hours. No results for input(s): AMMONIA in the last 168 hours. Coagulation Profile: No results for input(s): INR, PROTIME in the last 168 hours. Cardiac Enzymes: No results for input(s): CKTOTAL, CKMB, CKMBINDEX, TROPONINI in the last 168 hours. BNP (last 3 results) Recent Labs    08/19/23 0940  PROBNP 1,003.0*   HbA1C: Recent Labs    08/19/23 1957  HGBA1C 5.2   CBG: Recent Labs  Lab 08/19/23 2140 08/20/23 0754 08/20/23 1152  GLUCAP 98 96 160*   Lipid Profile: No results for input(s): CHOL, HDL, LDLCALC, TRIG, CHOLHDL, LDLDIRECT in the last 72 hours. Thyroid  Function Tests: No results for input(s): TSH, T4TOTAL, FREET4, T3FREE, THYROIDAB in the last 72 hours. Anemia Panel: No results for input(s): VITAMINB12, FOLATE, FERRITIN, TIBC, IRON , RETICCTPCT in the last 72 hours. Sepsis Labs: No  results for input(s): PROCALCITON, LATICACIDVEN in the last 168 hours.  No results found for this or any previous visit (from the past 240 hours).   Radiology Studies: VAS US  LOWER EXTREMITY VENOUS (DVT) (7a-7p) Result Date: 08/20/2023  Lower Venous DVT Study Patient Name:  HERMES WAFER  Date of Exam:   08/20/2023 Medical Rec #: [id number]       Accession #:    [id number] Date of Birth: 07-19-1950       Patient Gender: M Patient Age:   67 years Exam Location:  [facility] Procedure:      VAS US  LOWER EXTREMITY VENOUS (DVT) Referring Phys: ALEJANDRO MARKER --------------------------------------------------------------------------------  Indications: Edema.  Limitations: Poor ultrasound/tissue interface. Comparison Study: Previous exams on 10/20/2019 & 07/04/2018 were negative for DVT Performing Technologist: Ezzie Potters RVT, RDMS  Examination Guidelines: A complete evaluation includes B-mode imaging, spectral Doppler, color Doppler, and power Doppler as needed of all accessible portions of each vessel. Bilateral testing is considered an integral part of a complete examination. Limited examinations for reoccurring indications may be performed as noted. The reflux portion of the exam is performed with the patient in reverse Trendelenburg.  +---------+---------------+---------+-----------+----------+--------------+ RIGHT    CompressibilityPhasicitySpontaneityPropertiesThrombus Aging +---------+---------------+---------+-----------+----------+--------------+ CFV      Full           No       Yes                                 +---------+---------------+---------+-----------+----------+--------------+ SFJ      Full                                                        +---------+---------------+---------+-----------+----------+--------------+ FV Prox  Full           Yes      Yes                                 +---------+---------------+---------+-----------+----------+--------------+  FV Mid   Full           Yes      Yes                                 +---------+---------------+---------+-----------+----------+--------------+  FV DistalFull           Yes      Yes                                 +---------+---------------+---------+-----------+----------+--------------+ PFV      Full                                                        +---------+---------------+---------+-----------+----------+--------------+ POP      Full           Yes      Yes                                 +---------+---------------+---------+-----------+----------+--------------+ PTV      Full                                                        +---------+---------------+---------+-----------+----------+--------------+ PERO     Full                                                        +---------+---------------+---------+-----------+----------+--------------+   +---------+---------------+---------+-----------+----------+--------------+ LEFT     CompressibilityPhasicitySpontaneityPropertiesThrombus Aging +---------+---------------+---------+-----------+----------+--------------+ CFV      Full           Yes      Yes                                 +---------+---------------+---------+-----------+----------+--------------+ SFJ      Full                                                        +---------+---------------+---------+-----------+----------+--------------+ FV Prox  Full           Yes      Yes                                 +---------+---------------+---------+-----------+----------+--------------+ FV Mid   Full           Yes      Yes                                 +---------+---------------+---------+-----------+----------+--------------+ FV DistalFull           Yes      Yes                                 +---------+---------------+---------+-----------+----------+--------------+ PFV      Full                                                         +---------+---------------+---------+-----------+----------+--------------+  POP      Full           Yes      Yes                                 +---------+---------------+---------+-----------+----------+--------------+ PTV      Full                                                        +---------+---------------+---------+-----------+----------+--------------+ PERO     Full                                                        +---------+---------------+---------+-----------+----------+--------------+     Summary: BILATERAL: - No evidence of deep vein thrombosis seen in the lower extremities, bilaterally. -No evidence of popliteal cyst, bilaterally. -Subcutaneous edema from knee to ankle, bilaterally.  *See table(s) above for measurements and observations. Electronically signed by Norman Serve on 08/20/2023 at 12:52:47 PM.    Final    ECHOCARDIOGRAM COMPLETE Result Date: 08/20/2023    ECHOCARDIOGRAM REPORT   Patient Name:   Giavonni Fonder Date of Exam: 08/20/2023 Medical Rec #:  [id number]      Height:       71.0 in Accession #:    [id number]     Weight:       170.0 lb Date of Birth:  1950-06-09      BSA:          1.968 m Patient Age:    73 years       BP:           130/63 mmHg Patient Gender: M              HR:           65 bpm. Exam Location:  Inpatient Procedure: 2D Echo, Cardiac Doppler and Color Doppler (Both Spectral and Color            Flow Doppler were utilized during procedure). Indications:    Dyspnea  History:        Patient has prior history of Echocardiogram examinations, most                 recent 11/25/2020. Signs/Symptoms:Dyspnea; Risk Factors:Diabetes.  Sonographer:    Philomena Daring Referring Phys: SHERRILL CABLE, LATIF IMPRESSIONS  1. Left ventricular ejection fraction, by estimation, is 65 to 70%. The left ventricle has normal function. The left ventricle has no regional wall motion abnormalities. There is moderate concentric left ventricular  hypertrophy. Left ventricular diastolic parameters are consistent with Grade I diastolic dysfunction (impaired relaxation).  2. Right ventricular systolic function is normal. The right ventricular size is normal.  3. Left atrial size was mildly dilated.  4. Right atrial size was mildly dilated.  5. A small pericardial effusion is present. The pericardial effusion is posterior to the left ventricle and localized near the right atrium. There is no evidence of cardiac tamponade.  6. The mitral valve is normal in structure. Trivial mitral valve regurgitation. No evidence of mitral stenosis.  7. The aortic valve is tricuspid. There is  mild calcification of the aortic valve. Aortic valve regurgitation is not visualized. No aortic stenosis is present.  8. The inferior vena cava is dilated in size with <50% respiratory variability, suggesting right atrial pressure of 15 mmHg. FINDINGS  Left Ventricle: Left ventricular ejection fraction, by estimation, is 65 to 70%. The left ventricle has normal function. The left ventricle has no regional wall motion abnormalities. The left ventricular internal cavity size was normal in size. There is  moderate concentric left ventricular hypertrophy. Left ventricular diastolic parameters are consistent with Grade I diastolic dysfunction (impaired relaxation). Right Ventricle: The right ventricular size is normal. No increase in right ventricular wall thickness. Right ventricular systolic function is normal. Left Atrium: Left atrial size was mildly dilated. Right Atrium: Right atrial size was mildly dilated. Pericardium: A small pericardial effusion is present. The pericardial effusion is posterior to the left ventricle and localized near the right atrium. There is no evidence of cardiac tamponade. Mitral Valve: The mitral valve is normal in structure. Trivial mitral valve regurgitation. No evidence of mitral valve stenosis. Tricuspid Valve: The tricuspid valve is normal in structure.  Tricuspid valve regurgitation is trivial. No evidence of tricuspid stenosis. Aortic Valve: The aortic valve is tricuspid. There is mild calcification of the aortic valve. Aortic valve regurgitation is not visualized. No aortic stenosis is present. Pulmonic Valve: The pulmonic valve was normal in structure. Pulmonic valve regurgitation is not visualized. No evidence of pulmonic stenosis. Aorta: The aortic root is normal in size and structure. Venous: The inferior vena cava is dilated in size with less than 50% respiratory variability, suggesting right atrial pressure of 15 mmHg. IAS/Shunts: No atrial level shunt detected by color flow Doppler.  LEFT VENTRICLE PLAX 2D LVIDd:         4.80 cm   Diastology LVIDs:         2.90 cm   LV e' medial:    7.51 cm/s LV PW:         1.30 cm   LV E/e' medial:  10.8 LV IVS:        1.30 cm   LV e' lateral:   8.59 cm/s LVOT diam:     2.20 cm   LV E/e' lateral: 9.5 LV SV:         127 LV SV Index:   64 LVOT Area:     3.80 cm  RIGHT VENTRICLE             IVC RV S prime:     15.20 cm/s  IVC diam: 2.40 cm TAPSE (M-mode): 2.8 cm LEFT ATRIUM             Index        RIGHT ATRIUM           Index LA diam:        3.50 cm 1.78 cm/m   RA Area:     20.10 cm LA Vol (A2C):   67.6 ml 34.35 ml/m  RA Volume:   55.50 ml  28.20 ml/m LA Vol (A4C):   46.7 ml 23.73 ml/m LA Biplane Vol: 58.9 ml 29.93 ml/m  AORTIC VALVE LVOT Vmax:   129.00 cm/s LVOT Vmean:  90.500 cm/s LVOT VTI:    0.333 m  AORTA Ao Root diam: 3.00 cm Ao Asc diam:  3.50 cm MITRAL VALVE MV Area (PHT): 2.75 cm    SHUNTS MV Decel Time: 276 msec    Systemic VTI:  0.33 m MV E velocity: 81.20 cm/s  Systemic Diam: 2.20 cm MV A velocity: 92.80 cm/s MV E/A ratio:  0.88 Toribio Fuel MD Electronically signed by Toribio Fuel MD Signature Date/Time: 08/20/2023/10:00:01 AM    Final    X-ray chest PA and lateral Result Date: 08/20/2023 CLINICAL DATA:  Dyspnea. EXAM: CHEST - 2 VIEW COMPARISON:  August 19, 2023. FINDINGS: The heart size and  mediastinal contours are within normal limits. Right internal jugular Port-A-Cath is unchanged. Both lungs are clear. The visualized skeletal structures are unremarkable. IMPRESSION: No active cardiopulmonary disease. Electronically Signed   By: Lynwood Landy Raddle M.D.   On: 08/20/2023 08:58   NM Pulmonary Perfusion Result Date: 08/19/2023 CLINICAL DATA:  Shortness of breath.  Hematologic malignancy. EXAM: NUCLEAR MEDICINE PERFUSION LUNG SCAN TECHNIQUE: Perfusion images were obtained in multiple projections after intravenous injection of radiopharmaceutical. Ventilation scans intentionally deferred if perfusion scan and chest x-ray adequate for interpretation during COVID 19 epidemic. RADIOPHARMACEUTICALS:  4.4 mCi Tc-27m MAA IV COMPARISON:  Chest radiograph dated 08/19/2023. FINDINGS: No perfusion defect noted.  There is uniform perfusion uptake. IMPRESSION: Negative. Electronically Signed   By: Vanetta Chou M.D.   On: 08/19/2023 17:10   DG Chest 2 View Result Date: 08/19/2023 CLINICAL DATA:  Shortness of breath.  History of multiple myeloma EXAM: CHEST - 2 VIEW COMPARISON:  CT scan 06/21/2023 FINDINGS: Right IJ chest port in place with tip along the central SVC. Enlarged cardiopericardial silhouette. No pneumothorax, effusion or edema. Vascular congestion. Overlapping cardiac leads. Degenerative changes along the spine. Fixation hardware of the cervical spine at the very edge of the imaging field. IMPRESSION: No acute cardiopulmonary disease.  Chest port Electronically Signed   By: Ranell Bring M.D.   On: 08/19/2023 11:53   Scheduled Meds:  Chlorhexidine  Gluconate Cloth  6 each Topical Daily   dicyclomine   10 mg Oral TID AC   furosemide   40 mg Intravenous Q12H   gabapentin   300 mg Oral TID   heparin   5,000 Units Subcutaneous Q8H   insulin  aspart  0-5 Units Subcutaneous QHS   insulin  aspart  0-9 Units Subcutaneous TID WC   ipratropium  0.5 mg Nebulization TID   levalbuterol   0.63 mg Nebulization  TID   Living Better with Heart Failure Book   Does not apply Once   metoprolol  succinate  25 mg Oral Daily   morphine   30 mg Oral BID   sodium chloride  flush  10-40 mL Intracatheter Q12H   Continuous Infusions:   LOS: 1 day   Alejandro Marker, DO [facility] Available via Epic secure chat 7am-7pm After these hours, please refer to coverage provider listed on [email or web address] 08/20/2023, 1:47 PM

## 2023-08-20 NOTE — Progress Notes (Signed)
 BLE venous duplex has been completed.   Results can be found under chart review under CV PROC. 08/20/2023 10:58 AM Demarie Uhlig RVT, RDMS

## 2023-08-20 NOTE — Progress Notes (Signed)
 PT Cancellation Note  Patient Details Name: [name] MRN: [id number] DOB: 1950/07/31   Cancelled Treatment:    Reason Eval/Treat Not Completed: PT screened, no needs identified, will sign off. Pt  mobilizing independently per pt and nursing. No PT needed.    Rodgers ORN [facility] 08/20/2023, 1:58 PM Rodgers Opal PT Acute [employer] [phone]

## 2023-08-20 NOTE — Progress Notes (Signed)
 OT Cancellation Note  Patient Details Name: [name] MRN: [id number] DOB: 11-Jan-1951   Cancelled Treatment:    Reason Eval/Treat Not Completed: OT screened, no needs identified, will sign off. Per conversation with PT, pt at functional baseline for ADLs, no OT needs identified. OT is signing off on this pt.  Nattalie Santiesteban C, OT  Acute Rehabilitation Services Office [phone] Secure chat preferred   [name] Savers 08/20/2023, 2:16 PM

## 2023-08-21 ENCOUNTER — Telehealth: Payer: Self-pay | Admitting: Cardiology

## 2023-08-21 ENCOUNTER — Inpatient Hospital Stay (HOSPITAL_COMMUNITY)

## 2023-08-21 DIAGNOSIS — I5033 Acute on chronic diastolic (congestive) heart failure: Secondary | ICD-10-CM | POA: Diagnosis not present

## 2023-08-21 DIAGNOSIS — R55 Syncope and collapse: Secondary | ICD-10-CM

## 2023-08-21 DIAGNOSIS — R0600 Dyspnea, unspecified: Secondary | ICD-10-CM | POA: Diagnosis not present

## 2023-08-21 LAB — LIPID PANEL
Cholesterol: 112 mg/dL (ref 0–200)
HDL: 45 mg/dL (ref >40)
LDL Cholesterol: 58 mg/dL (ref 0–99)
Total CHOL/HDL Ratio: 2.5 ratio
Triglycerides: 43 mg/dL (ref <150)
VLDL: 9 mg/dL (ref 0–40)

## 2023-08-21 LAB — MAGNESIUM: Magnesium: 2.2 mg/dL (ref 1.7–2.4)

## 2023-08-21 LAB — IRON AND TIBC
Iron: 93 ug/dL (ref 45–182)
Saturation Ratios: 55 % — ABNORMAL HIGH (ref 17.9–39.5)
TIBC: 168 ug/dL — ABNORMAL LOW (ref 250–450)
UIBC: 75 ug/dL

## 2023-08-21 LAB — COMPREHENSIVE METABOLIC PANEL WITH GFR
ALT: 17 U/L (ref 0–44)
AST: 20 U/L (ref 15–41)
Albumin: 3 g/dL — ABNORMAL LOW (ref 3.5–5.0)
Alkaline Phosphatase: 73 U/L (ref 38–126)
Anion gap: 10 (ref 5–15)
BUN: 50 mg/dL — ABNORMAL HIGH (ref 8–23)
CO2: 25 mmol/L (ref 22–32)
Calcium: 8.3 mg/dL — ABNORMAL LOW (ref 8.9–10.3)
Chloride: 106 mmol/L (ref 98–111)
Creatinine, Ser: 3.41 mg/dL — ABNORMAL HIGH (ref 0.61–1.24)
GFR, Estimated: 18 mL/min — ABNORMAL LOW (ref >60)
Glucose, Bld: 85 mg/dL (ref 70–99)
Potassium: 4.4 mmol/L (ref 3.5–5.1)
Sodium: 141 mmol/L (ref 135–145)
Total Bilirubin: 0.5 mg/dL (ref 0.0–1.2)
Total Protein: 7 g/dL (ref 6.5–8.1)

## 2023-08-21 LAB — RETICULOCYTES
Immature Retic Fract: 14.6 % (ref 2.3–15.9)
RBC.: 3.98 MIL/uL — ABNORMAL LOW (ref 4.22–5.81)
Retic Count, Absolute: 24.7 10*3/uL (ref 19.0–186.0)
Retic Ct Pct: 0.6 % (ref 0.4–3.1)

## 2023-08-21 LAB — CBC WITH DIFFERENTIAL/PLATELET
Abs Immature Granulocytes: 0.05 10*3/uL (ref 0.00–0.07)
Basophils Absolute: 0.1 10*3/uL (ref 0.0–0.1)
Basophils Relative: 1 %
Eosinophils Absolute: 0.2 10*3/uL (ref 0.0–0.5)
Eosinophils Relative: 4 %
HCT: 29.9 % — ABNORMAL LOW (ref 39.0–52.0)
Hemoglobin: 9.3 g/dL — ABNORMAL LOW (ref 13.0–17.0)
Immature Granulocytes: 1 %
Lymphocytes Relative: 24 %
Lymphs Abs: 1.4 10*3/uL (ref 0.7–4.0)
MCH: 23.5 pg — ABNORMAL LOW (ref 26.0–34.0)
MCHC: 31.1 g/dL (ref 30.0–36.0)
MCV: 75.5 fL — ABNORMAL LOW (ref 80.0–100.0)
Monocytes Absolute: 1 10*3/uL (ref 0.1–1.0)
Monocytes Relative: 17 %
Neutro Abs: 3 10*3/uL (ref 1.7–7.7)
Neutrophils Relative %: 53 %
Platelets: 182 10*3/uL (ref 150–400)
RBC: 3.96 MIL/uL — ABNORMAL LOW (ref 4.22–5.81)
RDW: 19.8 % — ABNORMAL HIGH (ref 11.5–15.5)
WBC: 5.7 10*3/uL (ref 4.0–10.5)
nRBC: 0 % (ref 0.0–0.2)

## 2023-08-21 LAB — VITAMIN B12: Vitamin B-12: 1167 pg/mL — ABNORMAL HIGH (ref 180–914)

## 2023-08-21 LAB — FERRITIN: Ferritin: 1143 ng/mL — ABNORMAL HIGH (ref 24–336)

## 2023-08-21 LAB — GLUCOSE, CAPILLARY
Glucose-Capillary: 92 mg/dL (ref 70–99)
Glucose-Capillary: 92 mg/dL (ref 70–99)

## 2023-08-21 LAB — PHOSPHORUS: Phosphorus: 4.5 mg/dL (ref 2.5–4.6)

## 2023-08-21 LAB — FOLATE: Folate: 5.6 ng/mL — ABNORMAL LOW (ref >5.9)

## 2023-08-21 MED ORDER — FUROSEMIDE 40 MG PO TABS: 40.0000 mg | ORAL_TABLET | Freq: Every day | ORAL | 0 refills | Status: AC

## 2023-08-21 MED ORDER — FUROSEMIDE 40 MG PO TABS: 40.0000 mg | ORAL_TABLET | Freq: Every day | ORAL | Status: DC

## 2023-08-21 MED ORDER — HEPARIN SOD (PORK) LOCK FLUSH 100 UNIT/ML IV SOLN: 500.0000 [IU] | INTRAVENOUS | Status: DC | PRN

## 2023-08-21 MED ORDER — ONDANSETRON HCL 4 MG PO TABS: 4.0000 mg | ORAL_TABLET | Freq: Four times a day (QID) | ORAL | 0 refills | Status: AC | PRN

## 2023-08-21 NOTE — Progress Notes (Signed)
 Patient ambulated entire length of hallway on room air. Oxygen saturations 96% or greater at all times. Patient with no complaints throughout.
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
## 2023-08-21 NOTE — Telephone Encounter (Signed)
 Ordering 2-week heart monitor for syncope.  Dr. Lavona to read.

## 2023-08-21 NOTE — Discharge Summary (Signed)
 Physician Discharge Summary   Patient: [name] MRN: [id number] DOB: 02-25-1950  Admit date:     08/19/2023  Discharge date: 08/21/23  Discharge Physician: Alejandro Marker, DO   PCP: Sim Emery CROME, MD   Recommendations at discharge:  {Tip this will not be part of the note when signed- Example include specific recommendations for outpatient follow-up, pending tests to follow-up on. (Optional):26781}  ***  Discharge Diagnoses: Principal Problem:   Dyspnea  Resolved Problems:   * No resolved hospital problems. *  Hospital Course: [name] is a 73 y.o. male with medical history significant for but not limited to anxiety, history of anemia/iron  deficiency and anemia renal disease, diabetes mellitus type 2, GERD, history of multiple myeloma, hypertension, hyperlipidemia, CKD stage IV and other comorbidities who presents with dyspnea which is progressed over the last 3 to 4 days.  Patient states that he became more short of breath and it is difficult for him to lay flat as well as ambulate without getting short of breath.  Also notices that his legs are extremely swollen and states that they have been worsening in the last 3 to 4 days as well.  Denies any chest pain.  No nausea or vomiting, lightheadedness or dizziness.  Was recently evaluated by his oncologist today and was sent to the ED for further evaluation.  [facility] was asked to admit this patient for dyspnea and volume overload and current working Dx is Acute on Chronic Diastolic CHF Exacerbation. Cardiology consulted for further evaluation and management.   Assessment and Plan:  Dyspnea and lower extremity swelling in the setting of Acute on Chronic Diastolic CHF: Admit to cardiac telemetry and check echocardiogram and initiate diuresis.  Given 40 mg of IV Lasix  in ED and will continue twice daily dosing of IV Lasix  40 mg BID.  Will need strict I's and O's and daily weights. Pro-BNP was 1003.0. BNP of 195.8. Has a history of  multiple myeloma so may have some cardiac involvement.  Rule out nephrotic syndrome and checked UA and Negative.  CTA PE protocol not able to be safely done given his renal function so a VQ scan was done which was negative.  Chest x-ray 2 views did show some vascular congestion yesterday.  Will continue with carvedilol  12.5 mg p.o. twice daily.  Hold his home diuretics of torsemide .  Add Xopenex  and Atrovent  and continue Breo Ellipta .  Since the extremities are swollen we will check lower extremity venous duplex to rule out DVT and this showed no VTE. Cardiology Consulted for Further evaluation and recommendations    IgG Kappa Multiple Myeloma: Not in remission and sees Dr. Timmy.  Is currently off of treatment due to poor tolerance and patient preference.   AKI on CKD Stage IV / Metabolic Acidosis: BUN/Cr Trend: Recent Labs  Lab 07/29/23 0759 08/19/23 0934 08/19/23 0940 08/19/23 0940 08/20/23 0745 08/21/23 0710  BUN 43* 58* 55*  --  48* 50*  CREATININE 3.96* 3.99* 3.95*   < > 3.68* 3.41*   < > = values in this interval not displayed.  -C/w diuresis as above -Avoid Nephrotoxic Medications, Contrast Dyes, Hypotension and Dehydration to Ensure Adequate Renal Perfusion and will need to Renally Adjust Meds -Continue to Monitor and Trend Renal Function carefully and repeat CMP in the AM    Chronic Pain from MM: resume Home Pain regimen   Anemia of Chronic Renal disease/ Microcytic Anemia / Iron  deficiency Anemia: Hgb/Hct Trend:  Recent Labs  Lab 07/29/23 0759 08/19/23  9065 08/19/23 0940 08/20/23 0745 08/21/23 0710  HGB 9.2* 8.8* 8.9* 8.8* 9.3*  HCT 30.4* 29.1* 29.1* 29.2* 29.9*  MCV 76.8* 75.0* 75.2* 76.0* 75.5*  -Check Anemia Panel in the AM. CTM for S/Sx of Volume Overload. Repeat CBC in the AM   Diabetes Mellitus type II with complication of Diabetic Neuropathy: Open A1c was 5.2.  Continue to monitor CBGs AC and at bedtime with sensitive NovoLog /scale insulin  AC and at bedtime.   Continue gabapentin  300 mg p.o. 3 times daily. CBGs ranging from 96-160  Hypoalbuminemia: Patient's Albumin  Level went from 3.9 -> 3.1. CTM and Trend and repeat CMP in the AM  Headache/Migraine: Resume Sumatriptan 100 mg PRN  Assessment and Plan: No notes have been filed under this hospital service. Service: Hospitalist     {Tip this will not be part of the note when signed Body mass index is 23.29 kg/m. , ,  (Optional):26781}  {(NOTE) Pain control PDMP Statment (Optional):26782} Consultants: *** Procedures performed: ***  Disposition: {Plan; Disposition:26390} Diet recommendation:  Discharge Diet Orders (From admission, onward)     Start     Ordered   08/21/23 0000  Diet - low sodium heart healthy        08/21/23 1305           {Diet_Plan:26776} DISCHARGE MEDICATION: Allergies as of 08/21/2023       Reactions   Iodinated Contrast Media Rash   Patient states he was instructed not to take IV contrast.  In 2008 he had an unknown reaction, and was told not to take it again.  He was also told not to take it due to his kidneys.   Deltasone [prednisone] Other (See Comments)   Unknown reaction   Ioversol Other (See Comments)   Unknown reaction   Iodine Anxiety, Rash, Other (See Comments)   Didn't feel right instructed not to take per MD--something with his port        Medication List     STOP taking these medications    amoxicillin  500 MG capsule Commonly known as: AMOXIL    torsemide  20 MG tablet Commonly known as: DEMADEX        TAKE these medications    amLODipine  5 MG tablet Commonly known as: NORVASC  Take 5 mg by mouth 2 (two) times daily. What changed: Another medication with the same name was removed. Continue taking this medication, and follow the directions you see here.   furosemide  40 MG tablet Commonly known as: LASIX  Take 1 tablet (40 mg total) by mouth daily.   gabapentin  300 MG capsule Commonly known as: NEURONTIN  TAKE 1 CAPSULE BY  MOUTH THREE TIMES A DAY   Januvia 100 MG tablet Generic drug: sitaGLIPtin Take 100 mg by mouth daily.   metoprolol  succinate 25 MG 24 hr tablet Commonly known as: TOPROL -XL Take 25 mg by mouth daily.   morphine  30 MG 12 hr tablet Commonly known as: MS CONTIN  Take 30 mg by mouth 3 (three) times daily.   omeprazole  40 MG capsule Commonly known as: PRILOSEC TAKE 1 CAPSULE BY MOUTH TWICE A DAY What changed: when to take this   ondansetron  4 MG tablet Commonly known as: ZOFRAN  Take 1 tablet (4 mg total) by mouth every 6 (six) hours as needed for nausea.   oxyCODONE -acetaminophen  10-325 MG tablet Commonly known as: PERCOCET Take 1 tablet by mouth every 4 (four) hours.   sildenafil 100 MG tablet Commonly known as: VIAGRA Take 100 mg by mouth daily as needed for erectile dysfunction.  SUMAtriptan 100 MG tablet Commonly known as: IMITREX Take 100 mg by mouth daily as needed for migraine.   Vitamin D  (Ergocalciferol ) 1.25 MG (50000 UNIT) Caps capsule Commonly known as: DRISDOL  Take 50,000 Units by mouth every Saturday.        Discharge Exam: Filed Weights   08/19/23 2108 08/20/23 0357 08/21/23 0614  Weight: 78.1 kg 77.1 kg 75.8 kg   ***  Condition at discharge: {DC Condition:26389}  The results of significant diagnostics from this hospitalization (including imaging, microbiology, ancillary and laboratory) are listed below for reference.   Imaging Studies: DG CHEST PORT 1 VIEW Result Date: 08/21/2023 CLINICAL DATA:  Shortness of breath. EXAM: PORTABLE CHEST 1 VIEW COMPARISON:  08/19/2023 FINDINGS: Right chest wall port a catheter is stable in position. Stable cardiomediastinal contours. No pleural fluid, interstitial edema or airspace disease. Visualized osseous structures appear intact. IMPRESSION: No acute cardiopulmonary disease. Electronically Signed   By: Waddell Calk M.D.   On: 08/21/2023 10:41   VAS US  LOWER EXTREMITY VENOUS (DVT) (7a-7p) Result Date:  08/20/2023  Lower Venous DVT Study Patient Name:  [name]  Date of Exam:   08/20/2023 Medical Rec #: [id number]       Accession #:    [id number] Date of Birth: 06-01-1950       Patient Gender: M Patient Age:   22 years Exam Location:  [facility] Procedure:      VAS US  LOWER EXTREMITY VENOUS (DVT) Referring Phys: ALEJANDRO MARKER --------------------------------------------------------------------------------  Indications: Edema.  Limitations: Poor ultrasound/tissue interface. Comparison Study: Previous exams on 10/20/2019 & 07/04/2018 were negative for DVT Performing Technologist: Ezzie Potters RVT, RDMS  Examination Guidelines: A complete evaluation includes B-mode imaging, spectral Doppler, color Doppler, and power Doppler as needed of all accessible portions of each vessel. Bilateral testing is considered an integral part of a complete examination. Limited examinations for reoccurring indications may be performed as noted. The reflux portion of the exam is performed with the patient in reverse Trendelenburg.  +---------+---------------+---------+-----------+----------+--------------+ RIGHT    CompressibilityPhasicitySpontaneityPropertiesThrombus Aging +---------+---------------+---------+-----------+----------+--------------+ CFV      Full           No       Yes                                 +---------+---------------+---------+-----------+----------+--------------+ SFJ      Full                                                        +---------+---------------+---------+-----------+----------+--------------+ FV Prox  Full           Yes      Yes                                 +---------+---------------+---------+-----------+----------+--------------+ FV Mid   Full           Yes      Yes                                 +---------+---------------+---------+-----------+----------+--------------+ FV DistalFull           Yes      Yes                                  +---------+---------------+---------+-----------+----------+--------------+  PFV      Full                                                        +---------+---------------+---------+-----------+----------+--------------+ POP      Full           Yes      Yes                                 +---------+---------------+---------+-----------+----------+--------------+ PTV      Full                                                        +---------+---------------+---------+-----------+----------+--------------+ PERO     Full                                                        +---------+---------------+---------+-----------+----------+--------------+   +---------+---------------+---------+-----------+----------+--------------+ LEFT     CompressibilityPhasicitySpontaneityPropertiesThrombus Aging +---------+---------------+---------+-----------+----------+--------------+ CFV      Full           Yes      Yes                                 +---------+---------------+---------+-----------+----------+--------------+ SFJ      Full                                                        +---------+---------------+---------+-----------+----------+--------------+ FV Prox  Full           Yes      Yes                                 +---------+---------------+---------+-----------+----------+--------------+ FV Mid   Full           Yes      Yes                                 +---------+---------------+---------+-----------+----------+--------------+ FV DistalFull           Yes      Yes                                 +---------+---------------+---------+-----------+----------+--------------+ PFV      Full                                                        +---------+---------------+---------+-----------+----------+--------------+  POP      Full           Yes      Yes                                  +---------+---------------+---------+-----------+----------+--------------+ PTV      Full                                                        +---------+---------------+---------+-----------+----------+--------------+ PERO     Full                                                        +---------+---------------+---------+-----------+----------+--------------+     Summary: BILATERAL: - No evidence of deep vein thrombosis seen in the lower extremities, bilaterally. -No evidence of popliteal cyst, bilaterally. -Subcutaneous edema from knee to ankle, bilaterally.  *See table(s) above for measurements and observations. Electronically signed by Norman Serve on 08/20/2023 at 12:52:47 PM.    Final    ECHOCARDIOGRAM COMPLETE Result Date: 08/20/2023    ECHOCARDIOGRAM REPORT   Patient Name:   [name] Date of Exam: 08/20/2023 Medical Rec #:  [id number]      Height:       71.0 in Accession #:    [id number]     Weight:       170.0 lb Date of Birth:  [birth year]      BSA:          1.968 m Patient Age:    73 years       BP:           130/63 mmHg Patient Gender: M              HR:           65 bpm. Exam Location:  Inpatient Procedure: 2D Echo, Cardiac Doppler and Color Doppler (Both Spectral and Color            Flow Doppler were utilized during procedure). Indications:    Dyspnea  History:        Patient has prior history of Echocardiogram examinations, most                 recent 11/25/2020. Signs/Symptoms:Dyspnea; Risk Factors:Diabetes.  Sonographer:    Philomena Daring Referring Phys: SHERRILL CABLE, LATIF IMPRESSIONS  1. Left ventricular ejection fraction, by estimation, is 65 to 70%. The left ventricle has normal function. The left ventricle has no regional wall motion abnormalities. There is moderate concentric left ventricular hypertrophy. Left ventricular diastolic parameters are consistent with Grade I diastolic dysfunction (impaired relaxation).  2. Right ventricular systolic function is normal. The  right ventricular size is normal.  3. Left atrial size was mildly dilated.  4. Right atrial size was mildly dilated.  5. A small pericardial effusion is present. The pericardial effusion is posterior to the left ventricle and localized near the right atrium. There is no evidence of cardiac tamponade.  6. The mitral valve is normal in structure. Trivial mitral valve regurgitation. No evidence of mitral stenosis.  7. The aortic valve is tricuspid. There  is mild calcification of the aortic valve. Aortic valve regurgitation is not visualized. No aortic stenosis is present.  8. The inferior vena cava is dilated in size with <50% respiratory variability, suggesting right atrial pressure of 15 mmHg. FINDINGS  Left Ventricle: Left ventricular ejection fraction, by estimation, is 65 to 70%. The left ventricle has normal function. The left ventricle has no regional wall motion abnormalities. The left ventricular internal cavity size was normal in size. There is  moderate concentric left ventricular hypertrophy. Left ventricular diastolic parameters are consistent with Grade I diastolic dysfunction (impaired relaxation). Right Ventricle: The right ventricular size is normal. No increase in right ventricular wall thickness. Right ventricular systolic function is normal. Left Atrium: Left atrial size was mildly dilated. Right Atrium: Right atrial size was mildly dilated. Pericardium: A small pericardial effusion is present. The pericardial effusion is posterior to the left ventricle and localized near the right atrium. There is no evidence of cardiac tamponade. Mitral Valve: The mitral valve is normal in structure. Trivial mitral valve regurgitation. No evidence of mitral valve stenosis. Tricuspid Valve: The tricuspid valve is normal in structure. Tricuspid valve regurgitation is trivial. No evidence of tricuspid stenosis. Aortic Valve: The aortic valve is tricuspid. There is mild calcification of the aortic valve. Aortic valve  regurgitation is not visualized. No aortic stenosis is present. Pulmonic Valve: The pulmonic valve was normal in structure. Pulmonic valve regurgitation is not visualized. No evidence of pulmonic stenosis. Aorta: The aortic root is normal in size and structure. Venous: The inferior vena cava is dilated in size with less than 50% respiratory variability, suggesting right atrial pressure of 15 mmHg. IAS/Shunts: No atrial level shunt detected by color flow Doppler.  LEFT VENTRICLE PLAX 2D LVIDd:         4.80 cm   Diastology LVIDs:         2.90 cm   LV e' medial:    7.51 cm/s LV PW:         1.30 cm   LV E/e' medial:  10.8 LV IVS:        1.30 cm   LV e' lateral:   8.59 cm/s LVOT diam:     2.20 cm   LV E/e' lateral: 9.5 LV SV:         127 LV SV Index:   64 LVOT Area:     3.80 cm  RIGHT VENTRICLE             IVC RV S prime:     15.20 cm/s  IVC diam: 2.40 cm TAPSE (M-mode): 2.8 cm LEFT ATRIUM             Index        RIGHT ATRIUM           Index LA diam:        3.50 cm 1.78 cm/m   RA Area:     20.10 cm LA Vol (A2C):   67.6 ml 34.35 ml/m  RA Volume:   55.50 ml  28.20 ml/m LA Vol (A4C):   46.7 ml 23.73 ml/m LA Biplane Vol: 58.9 ml 29.93 ml/m  AORTIC VALVE LVOT Vmax:   129.00 cm/s LVOT Vmean:  90.500 cm/s LVOT VTI:    0.333 m  AORTA Ao Root diam: 3.00 cm Ao Asc diam:  3.50 cm MITRAL VALVE MV Area (PHT): 2.75 cm    SHUNTS MV Decel Time: 276 msec    Systemic VTI:  0.33 m MV E velocity: 81.20 cm/s  Systemic Diam: 2.20 cm MV A velocity: 92.80 cm/s MV E/A ratio:  0.88 Toribio Fuel MD Electronically signed by Toribio Fuel MD Signature Date/Time: 08/20/2023/10:00:01 AM    Final    X-ray chest PA and lateral Result Date: 08/20/2023 CLINICAL DATA:  Dyspnea. EXAM: CHEST - 2 VIEW COMPARISON:  August 19, 2023. FINDINGS: The heart size and mediastinal contours are within normal limits. Right internal jugular Port-A-Cath is unchanged. Both lungs are clear. The visualized skeletal structures are unremarkable. IMPRESSION: No  active cardiopulmonary disease. Electronically Signed   By: Lynwood Landy Raddle M.D.   On: 08/20/2023 08:58   NM Pulmonary Perfusion Result Date: 08/19/2023 CLINICAL DATA:  Shortness of breath.  Hematologic malignancy. EXAM: NUCLEAR MEDICINE PERFUSION LUNG SCAN TECHNIQUE: Perfusion images were obtained in multiple projections after intravenous injection of radiopharmaceutical. Ventilation scans intentionally deferred if perfusion scan and chest x-ray adequate for interpretation during COVID 19 epidemic. RADIOPHARMACEUTICALS:  4.4 mCi Tc-35m MAA IV COMPARISON:  Chest radiograph dated 08/19/2023. FINDINGS: No perfusion defect noted.  There is uniform perfusion uptake. IMPRESSION: Negative. Electronically Signed   By: Vanetta Chou M.D.   On: 08/19/2023 17:10   DG Chest 2 View Result Date: 08/19/2023 CLINICAL DATA:  Shortness of breath.  History of multiple myeloma EXAM: CHEST - 2 VIEW COMPARISON:  CT scan 06/21/2023 FINDINGS: Right IJ chest port in place with tip along the central SVC. Enlarged cardiopericardial silhouette. No pneumothorax, effusion or edema. Vascular congestion. Overlapping cardiac leads. Degenerative changes along the spine. Fixation hardware of the cervical spine at the very edge of the imaging field. IMPRESSION: No acute cardiopulmonary disease.  Chest port Electronically Signed   By: Ranell Bring M.D.   On: 08/19/2023 11:53    Microbiology: Results for orders placed or performed during the hospital encounter of 05/06/20  SARS CORONAVIRUS 2 (TAT 6-24 HRS) Nasopharyngeal Nasopharyngeal Swab     Status: None   Collection Time: 05/06/20 11:13 AM   Specimen: Nasopharyngeal Swab  Result Value Ref Range Status   SARS Coronavirus 2 NEGATIVE NEGATIVE Final    Comment: (NOTE) SARS-CoV-2 target nucleic acids are NOT DETECTED.  The SARS-CoV-2 RNA is generally detectable in upper and lower respiratory specimens during the acute phase of infection. Negative results do not preclude  SARS-CoV-2 infection, do not rule out co-infections with other pathogens, and should not be used as the sole basis for treatment or other patient management decisions. Negative results must be combined with clinical observations, patient history, and epidemiological information. The expected result is Negative.  Fact Sheet for Patients: HairSlick.no  Fact Sheet for Healthcare Providers: quierodirigir.com  This test is not yet approved or cleared by the [place]  FDA and  has been authorized for detection and/or diagnosis of SARS-CoV-2 by FDA under an Emergency Use Authorization (EUA). This EUA will remain  in effect (meaning this test can be used) for the duration of the COVID-19 declaration under Se ction 564(b)(1) of the Act, 21 U.S.C. section 360bbb-3(b)(1), unless the authorization is terminated or revoked sooner.  Performed at [facility], [address], [place], [place] [address]    *Note: Due to a large number of results and/or encounters for the requested time period, some results have not been displayed. A complete set of results can be found in Results Review.    Labs: CBC: Recent Labs  Lab 08/19/23 0934 08/19/23 0940 08/20/23 0745 08/21/23 0710  WBC 6.3 6.4 5.3 5.7  NEUTROABS 4.1  --  3.1 3.0  HGB  8.8* 8.9* 8.8* 9.3*  HCT 29.1* 29.1* 29.2* 29.9*  MCV 75.0* 75.2* 76.0* 75.5*  PLT 188 192 182 182   Basic Metabolic Panel: Recent Labs  Lab 08/19/23 0934 08/19/23 0940 08/20/23 0745 08/21/23 0710  NA 137 137 137 141  K 4.5 4.7 4.3 4.4  CL 104 101 104 106  CO2 24 20* 24 25  GLUCOSE 90 91 84 85  BUN 58* 55* 48* 50*  CREATININE 3.99* 3.95* 3.68* 3.41*  CALCIUM  8.4* 8.5* 8.2* 8.3*  MG  --   --  2.1 2.2  PHOS  --   --  4.5 4.5   Liver Function Tests: Recent Labs  Lab 08/19/23 0934 08/20/23 0745 08/21/23 0710  AST 20 24 20   ALT 15 18 17   ALKPHOS 78 72 73  BILITOT 0.3 0.4 0.5  PROT  7.2 6.9 7.0  ALBUMIN  3.9 3.1* 3.0*   CBG: Recent Labs  Lab 08/20/23 1152 08/20/23 1627 08/20/23 2137 08/21/23 0816 08/21/23 1205  GLUCAP 160* 108* 92 92 92    Discharge time spent: {LESS THAN/GREATER THAN:26388} 30 minutes.  Signed: Alejandro Lazarus Marker, DO [facility] 08/23/2023

## 2023-08-23 ENCOUNTER — Ambulatory Visit: Attending: Cardiology

## 2023-08-23 DIAGNOSIS — R55 Syncope and collapse: Secondary | ICD-10-CM

## 2023-08-23 NOTE — Progress Notes (Unsigned)
Enrolled patient for a 14 day Zio XT monitor to be mailed to patients home  Hochrein to read

## 2023-08-25 ENCOUNTER — Telehealth: Payer: Self-pay | Admitting: Podiatry

## 2023-08-25 NOTE — Telephone Encounter (Signed)
 Pt called and left message stating he needed to know what time and date of his surgery that is coming up.  Returned call and told him surgery is scheduled 09/13/23 and the surgery center will call him the week before his surgery to tell him what time he needs to arrive for his surgery. He said ok

## 2023-09-03 ENCOUNTER — Other Ambulatory Visit: Payer: Self-pay | Admitting: *Deleted

## 2023-09-03 ENCOUNTER — Inpatient Hospital Stay (HOSPITAL_BASED_OUTPATIENT_CLINIC_OR_DEPARTMENT_OTHER): Admitting: Family

## 2023-09-03 ENCOUNTER — Inpatient Hospital Stay

## 2023-09-03 ENCOUNTER — Encounter: Payer: Self-pay | Admitting: Family

## 2023-09-03 ENCOUNTER — Inpatient Hospital Stay: Attending: Hematology & Oncology

## 2023-09-03 ENCOUNTER — Telehealth: Payer: Self-pay | Admitting: Podiatry

## 2023-09-03 VITALS — BP 158/68 | HR 87 | Resp 17

## 2023-09-03 VITALS — BP 145/72 | HR 75 | Temp 97.6°F | Resp 18 | Ht 71.0 in | Wt 169.0 lb

## 2023-09-03 DIAGNOSIS — D631 Anemia in chronic kidney disease: Secondary | ICD-10-CM | POA: Insufficient documentation

## 2023-09-03 DIAGNOSIS — E349 Endocrine disorder, unspecified: Secondary | ICD-10-CM

## 2023-09-03 DIAGNOSIS — D500 Iron deficiency anemia secondary to blood loss (chronic): Secondary | ICD-10-CM | POA: Diagnosis not present

## 2023-09-03 DIAGNOSIS — G629 Polyneuropathy, unspecified: Secondary | ICD-10-CM | POA: Insufficient documentation

## 2023-09-03 DIAGNOSIS — C9000 Multiple myeloma not having achieved remission: Secondary | ICD-10-CM

## 2023-09-03 DIAGNOSIS — E291 Testicular hypofunction: Secondary | ICD-10-CM | POA: Diagnosis not present

## 2023-09-03 DIAGNOSIS — R5383 Other fatigue: Secondary | ICD-10-CM

## 2023-09-03 DIAGNOSIS — N189 Chronic kidney disease, unspecified: Secondary | ICD-10-CM | POA: Diagnosis not present

## 2023-09-03 DIAGNOSIS — I10 Essential (primary) hypertension: Secondary | ICD-10-CM

## 2023-09-03 DIAGNOSIS — E611 Iron deficiency: Secondary | ICD-10-CM

## 2023-09-03 DIAGNOSIS — K921 Melena: Secondary | ICD-10-CM

## 2023-09-03 DIAGNOSIS — M4712 Other spondylosis with myelopathy, cervical region: Secondary | ICD-10-CM

## 2023-09-03 DIAGNOSIS — Z299 Encounter for prophylactic measures, unspecified: Secondary | ICD-10-CM

## 2023-09-03 DIAGNOSIS — K224 Dyskinesia of esophagus: Secondary | ICD-10-CM

## 2023-09-03 DIAGNOSIS — M25562 Pain in left knee: Secondary | ICD-10-CM

## 2023-09-03 DIAGNOSIS — N184 Chronic kidney disease, stage 4 (severe): Secondary | ICD-10-CM | POA: Insufficient documentation

## 2023-09-03 DIAGNOSIS — L84 Corns and callosities: Secondary | ICD-10-CM

## 2023-09-03 DIAGNOSIS — E538 Deficiency of other specified B group vitamins: Secondary | ICD-10-CM | POA: Diagnosis not present

## 2023-09-03 DIAGNOSIS — M779 Enthesopathy, unspecified: Secondary | ICD-10-CM

## 2023-09-03 DIAGNOSIS — M1990 Unspecified osteoarthritis, unspecified site: Secondary | ICD-10-CM

## 2023-09-03 DIAGNOSIS — M2550 Pain in unspecified joint: Secondary | ICD-10-CM

## 2023-09-03 DIAGNOSIS — C9001 Multiple myeloma in remission: Secondary | ICD-10-CM

## 2023-09-03 DIAGNOSIS — J3489 Other specified disorders of nose and nasal sinuses: Secondary | ICD-10-CM

## 2023-09-03 LAB — CMP (CANCER CENTER ONLY)
ALT: 28 U/L (ref 0–44)
AST: 31 U/L (ref 15–41)
Albumin: 4.1 g/dL (ref 3.5–5.0)
Alkaline Phosphatase: 90 U/L (ref 38–126)
Anion gap: 9 (ref 5–15)
BUN: 34 mg/dL — ABNORMAL HIGH (ref 8–23)
CO2: 25 mmol/L (ref 22–32)
Calcium: 9.1 mg/dL (ref 8.9–10.3)
Chloride: 105 mmol/L (ref 98–111)
Creatinine: 3.12 mg/dL — ABNORMAL HIGH (ref 0.61–1.24)
GFR, Estimated: 20 mL/min — ABNORMAL LOW (ref >60)
Glucose, Bld: 92 mg/dL (ref 70–99)
Potassium: 4.1 mmol/L (ref 3.5–5.1)
Sodium: 139 mmol/L (ref 135–145)
Total Bilirubin: 0.4 mg/dL (ref 0.0–1.2)
Total Protein: 8.1 g/dL (ref 6.5–8.1)

## 2023-09-03 LAB — CBC WITH DIFFERENTIAL (CANCER CENTER ONLY)
Abs Immature Granulocytes: 0.02 K/uL (ref 0.00–0.07)
Basophils Absolute: 0.1 K/uL (ref 0.0–0.1)
Basophils Relative: 1 %
Eosinophils Absolute: 0.2 K/uL (ref 0.0–0.5)
Eosinophils Relative: 5 %
HCT: 31.1 % — ABNORMAL LOW (ref 39.0–52.0)
Hemoglobin: 9.4 g/dL — ABNORMAL LOW (ref 13.0–17.0)
Immature Granulocytes: 0 %
Lymphocytes Relative: 17 %
Lymphs Abs: 0.9 K/uL (ref 0.7–4.0)
MCH: 22.6 pg — ABNORMAL LOW (ref 26.0–34.0)
MCHC: 30.2 g/dL (ref 30.0–36.0)
MCV: 74.8 fL — ABNORMAL LOW (ref 80.0–100.0)
Monocytes Absolute: 0.6 K/uL (ref 0.1–1.0)
Monocytes Relative: 12 %
Neutro Abs: 3.4 K/uL (ref 1.7–7.7)
Neutrophils Relative %: 65 %
Platelet Count: 181 K/uL (ref 150–400)
RBC: 4.16 MIL/uL — ABNORMAL LOW (ref 4.22–5.81)
RDW: 20.6 % — ABNORMAL HIGH (ref 11.5–15.5)
WBC Count: 5.3 K/uL (ref 4.0–10.5)
nRBC: 0 % (ref 0.0–0.2)

## 2023-09-03 LAB — IRON AND IRON BINDING CAPACITY (CC-WL,HP ONLY)
Iron: 98 ug/dL (ref 45–182)
Saturation Ratios: 50 % — ABNORMAL HIGH (ref 17.9–39.5)
TIBC: 195 ug/dL — ABNORMAL LOW (ref 250–450)
UIBC: 97 ug/dL — ABNORMAL LOW (ref 117–376)

## 2023-09-03 LAB — FERRITIN: Ferritin: 1966 ng/mL — ABNORMAL HIGH (ref 24–336)

## 2023-09-03 LAB — LACTATE DEHYDROGENASE: LDH: 246 U/L — ABNORMAL HIGH (ref 98–192)

## 2023-09-03 MED ORDER — CYANOCOBALAMIN 1000 MCG/ML IJ SOLN
1000.0000 ug | Freq: Once | INTRAMUSCULAR | Status: AC
  Administered 2023-09-03: 1000 ug via INTRAMUSCULAR
  Filled 2023-09-03: qty 1

## 2023-09-03 MED ORDER — SODIUM CHLORIDE 0.9% FLUSH
10.0000 mL | Freq: Once | INTRAVENOUS | Status: AC
  Administered 2023-09-03: 10 mL via INTRAVENOUS

## 2023-09-03 MED ORDER — TESTOSTERONE CYPIONATE 200 MG/ML IM SOLN
400.0000 mg | Freq: Once | INTRAMUSCULAR | Status: AC
  Administered 2023-09-03: 400 mg via INTRAMUSCULAR
  Filled 2023-09-03: qty 2

## 2023-09-03 MED ORDER — HEPARIN SOD (PORK) LOCK FLUSH 100 UNIT/ML IV SOLN
500.0000 [IU] | Freq: Once | INTRAVENOUS | Status: AC
  Administered 2023-09-03: 500 [IU] via INTRAVENOUS

## 2023-09-03 MED ORDER — EPOETIN ALFA-EPBX 40000 UNIT/ML IJ SOLN
40000.0000 [IU] | Freq: Once | INTRAMUSCULAR | Status: AC
  Administered 2023-09-03: 40000 [IU] via SUBCUTANEOUS
  Filled 2023-09-03: qty 1

## 2023-09-03 MED ORDER — HYDROMORPHONE HCL 1 MG/ML IJ SOLN
2.0000 mg | Freq: Once | INTRAMUSCULAR | Status: AC
  Administered 2023-09-03: 2 mg via INTRAVENOUS
  Filled 2023-09-03: qty 2

## 2023-09-03 MED ORDER — SODIUM CHLORIDE 0.9 % IV SOLN: INTRAVENOUS | Status: DC

## 2023-09-03 NOTE — Patient Instructions (Signed)

## 2023-09-03 NOTE — Patient Instructions (Signed)

## 2023-09-03 NOTE — Progress Notes (Signed)
 Hematology and Oncology Follow Up Visit  [name] [id number] [birth year] 73 y.o. 09/03/2023   Principle Diagnosis:  IgG kappa myeloma Anemia secondary to renal insufficiency Intermittent iron  - deficiency anemia Hypotestosteronemia   Prior Therapy:        Kyprolis /Cytoxan  - s/p cycle #40 --DC on 12/24/2022   Current Therapy:  Aredia  60 mg IV q  month -- Started on 07/2022 Retacrit  SQ as needed for hemoglobin less than 10 DepoTestosterone 400 mg q 4 weeks B12 IM monthly IV iron  as indicated        Interim History:  [name] is here today for follow-up and treatment. He is going to have his right pinky toe removed next week due to an ingrown corn. He states that this is quite painful.  He states that he has some fatigue at times.  He states that he passed out at a stop light last week and when he woke up he came here to the ED. He is currently wearing a Holter monitor to assess for cause.  He had a wonderful fourth with family at a cookout.  No M-spike last month. IgG level was 1,928 and kappa light chains 12.46 mg/dL. No issues with fever, chills, n/v, cough, rash, dizziness, SOB, chest pain, palpitations, abdominal pain or changes in bowel or bladder habits.  No blood loss, bruising or petechiae.  No swelling in his extremities.  Neuropathy in the feet unchanged from baseline.  No falls reported.  Appetite and hydration are good per patient. Weight is 169 lbs.   ECOG Performance Status: 1 - Symptomatic but completely ambulatory  Medications:  Allergies as of 09/03/2023       Reactions   Deltasone [prednisone] Itching, Dermatitis, Rash   Iodinated Contrast Media Rash   Patient states he was instructed not to take IV contrast.  In 2008 he had an unknown reaction, and was told not to take it again.  He was also told not to take it due to his kidneys.   Iodine Anxiety, Rash, Other (See Comments)   Didn't feel right instructed not to take per MD--something with his  port   Ioversol Other (See Comments)   Unknown reaction        Medication List        Accurate as of September 03, 2023  9:02 AM. If you have any questions, ask your nurse or doctor.          amLODipine  5 MG tablet Commonly known as: NORVASC  Take 5 mg by mouth 2 (two) times daily.   furosemide  40 MG tablet Commonly known as: LASIX  Take 1 tablet (40 mg total) by mouth daily.   gabapentin  300 MG capsule Commonly known as: NEURONTIN  TAKE 1 CAPSULE BY MOUTH THREE TIMES A DAY   Januvia 100 MG tablet Generic drug: sitaGLIPtin Take 100 mg by mouth daily.   metoprolol  succinate 25 MG 24 hr tablet Commonly known as: TOPROL -XL Take 25 mg by mouth daily.   morphine  30 MG 12 hr tablet Commonly known as: MS CONTIN  Take 30 mg by mouth 3 (three) times daily.   omeprazole  40 MG capsule Commonly known as: PRILOSEC TAKE 1 CAPSULE BY MOUTH TWICE A DAY   ondansetron  4 MG tablet Commonly known as: ZOFRAN  Take 1 tablet (4 mg total) by mouth every 6 (six) hours as needed for nausea.   oxyCODONE -acetaminophen  10-325 MG tablet Commonly known as: PERCOCET Take 1 tablet by mouth every 4 (four) hours.   sildenafil 100 MG tablet Commonly  known as: VIAGRA Take 100 mg by mouth daily as needed for erectile dysfunction.   SUMAtriptan  100 MG tablet Commonly known as: IMITREX  Take 100 mg by mouth daily as needed for migraine.   Vitamin D  (Ergocalciferol ) 1.25 MG (50000 UNIT) Caps capsule Commonly known as: DRISDOL  Take 50,000 Units by mouth every Saturday.        Allergies:  Allergies  Allergen Reactions   Deltasone [Prednisone] Itching, Dermatitis and Rash   Iodinated Contrast Media Rash    Patient states he was instructed not to take IV contrast.  In 2008 he had an unknown reaction, and was told not to take it again.  He was also told not to take it due to his kidneys.   Iodine Anxiety, Rash and Other (See Comments)    Didn't feel right instructed not to take per  MD--something with his port    Ioversol Other (See Comments)    Unknown reaction    Past Medical History, Surgical history, Social history, and Family History were reviewed and updated.  Review of Systems: All other 10 point review of systems is negative.   Physical Exam:  height is 5' 11 (1.803 m) and weight is 169 lb (76.7 kg). His oral temperature is 97.6 F (36.4 C). His blood pressure is 145/72 (abnormal) and his pulse is 75. His respiration is 18 and oxygen saturation is 100%.   Wt Readings from Last 3 Encounters:  09/03/23 169 lb (76.7 kg)  08/21/23 167 lb (75.8 kg)  07/29/23 170 lb 1.3 oz (77.1 kg)    Ocular: Sclerae unicteric, pupils equal, round and reactive to light Ear-nose-throat: Oropharynx clear, dentition fair Lymphatic: No cervical or supraclavicular adenopathy Lungs no rales or rhonchi, good excursion bilaterally Heart regular rate and rhythm, no murmur appreciated Abd soft, nontender, positive bowel sounds MSK no focal spinal tenderness, no joint edema Neuro: non-focal, well-oriented, appropriate affect Breasts: Deferred   Lab Results  Component Value Date   WBC 5.3 09/03/2023   HGB 9.4 (L) 09/03/2023   HCT 31.1 (L) 09/03/2023   MCV 74.8 (L) 09/03/2023   PLT 181 09/03/2023   Lab Results  Component Value Date   FERRITIN 1,143 (H) 08/21/2023   IRON  93 08/21/2023   TIBC 168 (L) 08/21/2023   UIBC 75 08/21/2023   IRONPCTSAT 55 (H) 08/21/2023   Lab Results  Component Value Date   RETICCTPCT 0.6 08/21/2023   RBC 4.16 (L) 09/03/2023   RETICCTABS 27.5 03/21/2014   Lab Results  Component Value Date   KPAFRELGTCHN 124.6 (H) 07/29/2023   LAMBDASER 71.0 (H) 07/29/2023   KAPLAMBRATIO 1.75 (H) 07/29/2023   Lab Results  Component Value Date   IGGSERUM 1,928 (H) 07/29/2023   IGA 275 07/29/2023   IGMSERUM 43 07/29/2023   Lab Results  Component Value Date   TOTALPROTELP 7.1 07/29/2023   ALBUMINELP 3.4 07/29/2023   A1GS 0.2 07/29/2023   A2GS 0.9  07/29/2023   BETS 0.9 07/29/2023   BETA2SER 0.5 11/07/2014   GAMS 1.6 07/29/2023   MSPIKE Not Observed 07/29/2023   SPEI Comment 05/06/2023     Chemistry      Component Value Date/Time   NA 139 09/03/2023 0820   NA 143 02/22/2017 0744   K 4.1 09/03/2023 0820   K 4.0 02/22/2017 0744   CL 105 09/03/2023 0820   CL 106 02/22/2017 0744   CO2 25 09/03/2023 0820   CO2 27 02/22/2017 0744   BUN 34 (H) 09/03/2023 0820   BUN 26 (  H) 02/22/2017 0744   CREATININE 3.12 (H) 09/03/2023 0820   CREATININE 2.3 (H) 02/22/2017 0744      Component Value Date/Time   CALCIUM  9.1 09/03/2023 0820   CALCIUM  9.3 02/22/2017 0744   ALKPHOS 90 09/03/2023 0820   ALKPHOS 80 02/22/2017 0744   AST 31 09/03/2023 0820   ALT 28 09/03/2023 0820   ALT 22 02/22/2017 0744   BILITOT 0.4 09/03/2023 0820       Impression and Plan: Mr. Kushnir is a very pleasant 73 yo African American gentleman with IgG kappa myeloma. He is off of treatment for his myeloma due to poor tolerance and patient preference.  We will proceed with B 12, testosterone  and ESA injections.  Follow-up in 1 month.    Lauraine Pepper, NP 7/11/20259:02 AM

## 2023-09-03 NOTE — Telephone Encounter (Signed)
 Left message for pt that we had him scheduled for surgery on 7/21 but due to his recent hospitalization the surgery center anesthesia dept does not feel comfortable doing the procedure there. It will have to be moved to [facility] and that date is 10/04/23. I asked pt to call me back to discuss.

## 2023-09-04 LAB — IGG, IGA, IGM
IgA: 265 mg/dL (ref 61–437)
IgG (Immunoglobin G), Serum: 2111 mg/dL — ABNORMAL HIGH (ref 603–1613)
IgM (Immunoglobulin M), Srm: 44 mg/dL (ref 15–143)

## 2023-09-04 LAB — TESTOSTERONE: Testosterone: 396 ng/dL (ref 264–916)

## 2023-09-06 LAB — KAPPA/LAMBDA LIGHT CHAINS
Kappa free light chain: 89.2 mg/L — ABNORMAL HIGH (ref 3.3–19.4)
Kappa, lambda light chain ratio: 1.59 (ref 0.26–1.65)
Lambda free light chains: 56 mg/L — ABNORMAL HIGH (ref 5.7–26.3)

## 2023-09-07 LAB — PROTEIN ELECTROPHORESIS, SERUM, WITH REFLEX
A/G Ratio: 0.9 (ref 0.7–1.7)
Albumin ELP: 3.7 g/dL (ref 2.9–4.4)
Alpha-1-Globulin: 0.2 g/dL (ref 0.0–0.4)
Alpha-2-Globulin: 0.8 g/dL (ref 0.4–1.0)
Beta Globulin: 1 g/dL (ref 0.7–1.3)
Gamma Globulin: 1.9 g/dL — ABNORMAL HIGH (ref 0.4–1.8)
Globulin, Total: 3.9 g/dL (ref 2.2–3.9)
Total Protein ELP: 7.6 g/dL (ref 6.0–8.5)

## 2023-09-13 ENCOUNTER — Telehealth: Payer: Self-pay | Admitting: Podiatry

## 2023-09-13 NOTE — Telephone Encounter (Signed)
 Received message from surgery center that pt showed up there and the daughter would like me to call her.  I called her and explained that I had left a message on 7/11 that the surgery was canceled at the surgery center that they could not do the surgery there due to pts recent hospitalization.  We have rescheduled pt to 10/04/23 at [facility]

## 2023-09-21 ENCOUNTER — Encounter: Payer: Self-pay | Admitting: Nurse Practitioner

## 2023-09-21 ENCOUNTER — Ambulatory Visit: Attending: Nurse Practitioner | Admitting: Nurse Practitioner

## 2023-09-21 VITALS — BP 142/72 | HR 89 | Ht 71.0 in | Wt 168.8 lb

## 2023-09-21 DIAGNOSIS — R55 Syncope and collapse: Secondary | ICD-10-CM | POA: Diagnosis not present

## 2023-09-21 DIAGNOSIS — I5032 Chronic diastolic (congestive) heart failure: Secondary | ICD-10-CM

## 2023-09-21 DIAGNOSIS — E782 Mixed hyperlipidemia: Secondary | ICD-10-CM

## 2023-09-21 DIAGNOSIS — N184 Chronic kidney disease, stage 4 (severe): Secondary | ICD-10-CM

## 2023-09-21 DIAGNOSIS — I10 Essential (primary) hypertension: Secondary | ICD-10-CM

## 2023-09-21 DIAGNOSIS — E1151 Type 2 diabetes mellitus with diabetic peripheral angiopathy without gangrene: Secondary | ICD-10-CM

## 2023-09-21 DIAGNOSIS — C9000 Multiple myeloma not having achieved remission: Secondary | ICD-10-CM

## 2023-09-21 NOTE — Patient Instructions (Signed)
 Medication Instructions:  Your physician recommends that you continue on your current medications as directed. Please refer to the Current Medication list given to you today.  *If you need a refill on your cardiac medications before your next appointment, please call your pharmacy*  Lab Work: NONE ordered at this time of appointment   Testing/Procedures: NONE ordered at this time of appointment   Follow-Up: At [facility], you and your health needs are our priority.  As part of our continuing mission to provide you with exceptional heart care, our providers are all part of one team.  This team includes your primary Cardiologist (physician) and Advanced Practice Providers or APPs (Physician Assistants and Nurse Practitioners) who all work together to provide you with the care you need, when you need it.  Your next appointment:   6 month(s)  Provider:   Lynwood Schilling, MD    We recommend signing up for the patient portal called MyChart.  Sign up information is provided on this After Visit Summary.  MyChart is used to connect with patients for Virtual Visits (Telemedicine).  Patients are able to view lab/test results, encounter notes, upcoming appointments, etc.  Non-urgent messages can be sent to your provider as well.   To learn more about what you can do with MyChart, go to [email or web address].

## 2023-09-21 NOTE — Progress Notes (Unsigned)
 Office Visit    Patient Name: [name] Date of Encounter: 09/21/2023  Primary Care Provider:  Sim Emery CROME, MD Primary Cardiologist:  Adrian Schilling, MD  Chief Complaint    73 year old male with a history of near-syncope, chronic diastolic heart failure, hypertension, hyperlipidemia, type 2 diabetes, CKD stage IV, multiple myeloma, anemia, GERD, fibromyalgia, and anxiety who presents for hospital follow-up related to heart failure and near syncope.  Past Medical History    Past Medical History:  Diagnosis Date   Anemia of renal disease 03/12/2011   Anemia, iron  deficiency 03/12/2011   Anxiety disorder    Arthritis    Diabetes mellitus without complication (HCC)    Diabetic neuropathy (HCC)    Diverticulosis    Fibromyalgia    GERD (gastroesophageal reflux disease)    Hemorrhoids    Herpes zoster    History of COVID-19 04/03/2019   Hyperlipidemia    Hypertension    Hypotestosteronism 03/17/2012   Multiple myeloma (HCC)    Myeloma (HCC) 12/31/2010   Type 2 diabetes mellitus (HCC)    Past Surgical History:  Procedure Laterality Date   BALLOON DILATION N/A 05/16/2021   Procedure: BALLOON DILATION;  Surgeon: Adrian Groom, MD;  Location: [facility] ENDOSCOPY;  Service: Gastroenterology;  Laterality: N/A;   BIOPSY  05/16/2021   Procedure: BIOPSY;  Surgeon: Adrian Groom, MD;  Location: [facility] ENDOSCOPY;  Service: Gastroenterology;;   CATARACT EXTRACTION     bi-lateral   COLONOSCOPY  07/13/2011   Procedure: COLONOSCOPY;  Surgeon: Adrian ONEIDA Buddy, MD,FACG;  Location: [facility] ENDOSCOPY;  Service: Endoscopy;  Laterality: N/A;   CYST EXCISION Right 08/29/2018   Procedure: CYST REMOVAL;  Surgeon: Adrian Favorite, MD;  Location: [facility];  Service: General;  Laterality: Right;   ESOPHAGOGASTRODUODENOSCOPY (EGD) WITH PROPOFOL  N/A 05/16/2021   Procedure: ESOPHAGOGASTRODUODENOSCOPY (EGD) WITH PROPOFOL ;  Surgeon: Adrian Groom, MD;  Location: [facility] ENDOSCOPY;  Service:  Gastroenterology;  Laterality: N/A;   IR CHEST FLUORO  09/22/2016   IR CHEST FLUORO  04/25/2020   IR FLUORO GUIDE PORT INSERTION RIGHT  09/23/2016   IR REMOVAL TUN ACCESS W/ PORT W/O FL MOD SED  09/23/2016   IR US  GUIDE VASC ACCESS LEFT  09/23/2016   KNEE ARTHROSCOPY     left   KNEE SURGERY     MASS EXCISION Right 05/09/2020   Procedure: RIGHT LONG FINGER EXCISION MASS,;  Surgeon: Adrian Drivers, MD;  Location: [facility];  Service: Orthopedics;  Laterality: Right;   NEVUS EXCISION N/A 08/29/2018   Procedure: , NEVUS LEFT ELBOW;  Surgeon: Adrian Favorite, MD;  Location: [facility];  Service: General;  Laterality: N/A;   POLYPECTOMY  05/16/2021   Procedure: POLYPECTOMY;  Surgeon: Adrian Groom, MD;  Location: [facility] ENDOSCOPY;  Service: Gastroenterology;;   PORTACATH PLACEMENT     POSTERIOR CERVICAL FUSION/FORAMINOTOMY N/A 06/05/2016   Procedure: LAMINECTOMY CERVICAL 3 - CERVICAL 6 WITH LATERAL MASS FUSION;  Surgeon: Adrian Maizes, MD;  Location: [facility] OR;  Service: Neurosurgery;  Laterality: N/A;  LAMINECTOMY CERVICAL 3 - CERVICAL 6 WITH LATERAL MASS FUSION   TONSILLECTOMY     TRANSURETHRAL RESECTION OF PROSTATE  01/2011   Dr. at [facility] in [place]    Allergies  Allergies  Allergen Reactions   Deltasone [Prednisone] Itching, Dermatitis and Rash   Iodinated Contrast Media Rash    Patient states he was instructed not to take IV contrast.  In 2008 he had an unknown reaction, and was told not to  take it again.  He was also told not to take it due to his kidneys.   Iodine Anxiety, Rash and Other (See Comments)    Didn't feel right instructed not to take per MD--something with his port    Ioversol Other (See Comments)    Unknown reaction     Labs/Other Studies Reviewed    The following studies were reviewed today:  Cardiac Studies & Procedures    ______________________________________________________________________________________________     ECHOCARDIOGRAM  ECHOCARDIOGRAM COMPLETE 08/20/2023  Narrative ECHOCARDIOGRAM REPORT    Patient Name:   [name] Date of Exam: 08/20/2023 Medical Rec #:  [id number]      Height:       71.0 in Accession #:    [id number]     Weight:       170.0 lb Date of Birth:  Sep 09, 1950      BSA:          1.968 m Patient Age:    73 years       BP:           130/63 mmHg Patient Gender: M              HR:           65 bpm. Exam Location:  Inpatient  Procedure: 2D Echo, Cardiac Doppler and Color Doppler (Both Spectral and Color Flow Doppler were utilized during procedure).  Indications:    Dyspnea  History:        Patient has prior history of Echocardiogram examinations, most recent 11/25/2020. Signs/Symptoms:Dyspnea; Risk Factors:Diabetes.  Sonographer:    [name] Referring Phys: SHERRILL CABLE, LATIF  IMPRESSIONS   1. Left ventricular ejection fraction, by estimation, is 65 to 70%. The left ventricle has normal function. The left ventricle has no regional wall motion abnormalities. There is moderate concentric left ventricular hypertrophy. Left ventricular diastolic parameters are consistent with Grade I diastolic dysfunction (impaired relaxation). 2. Right ventricular systolic function is normal. The right ventricular size is normal. 3. Left atrial size was mildly dilated. 4. Right atrial size was mildly dilated. 5. A small pericardial effusion is present. The pericardial effusion is posterior to the left ventricle and localized near the right atrium. There is no evidence of cardiac tamponade. 6. The mitral valve is normal in structure. Trivial mitral valve regurgitation. No evidence of mitral stenosis. 7. The aortic valve is tricuspid. There is mild calcification of the aortic valve. Aortic valve regurgitation is not visualized. No aortic stenosis is present. 8. The inferior vena  cava is dilated in size with <50% respiratory variability, suggesting right atrial pressure of 15 mmHg.  FINDINGS Left Ventricle: Left ventricular ejection fraction, by estimation, is 65 to 70%. The left ventricle has normal function. The left ventricle has no regional wall motion abnormalities. The left ventricular internal cavity size was normal in size. There is moderate concentric left ventricular hypertrophy. Left ventricular diastolic parameters are consistent with Grade I diastolic dysfunction (impaired relaxation).  Right Ventricle: The right ventricular size is normal. No increase in right ventricular wall thickness. Right ventricular systolic function is normal.  Left Atrium: Left atrial size was mildly dilated.  Right Atrium: Right atrial size was mildly dilated.  Pericardium: A small pericardial effusion is present. The pericardial effusion is posterior to the left ventricle and localized near the right atrium. There is no evidence of cardiac tamponade.  Mitral Valve: The mitral valve is normal in structure. Trivial mitral valve regurgitation. No evidence of mitral valve stenosis.  Tricuspid Valve: The tricuspid valve is normal in structure. Tricuspid valve regurgitation is trivial. No evidence of tricuspid stenosis.  Aortic Valve: The aortic valve is tricuspid. There is mild calcification of the aortic valve. Aortic valve regurgitation is not visualized. No aortic stenosis is present.  Pulmonic Valve: The pulmonic valve was normal in structure. Pulmonic valve regurgitation is not visualized. No evidence of pulmonic stenosis.  Aorta: The aortic root is normal in size and structure.  Venous: The inferior vena cava is dilated in size with less than 50% respiratory variability, suggesting right atrial pressure of 15 mmHg.  IAS/Shunts: No atrial level shunt detected by color flow Doppler.   LEFT VENTRICLE PLAX 2D LVIDd:         4.80 cm   Diastology LVIDs:         2.90 cm   LV  e' medial:    7.51 cm/s LV PW:         1.30 cm   LV E/e' medial:  10.8 LV IVS:        1.30 cm   LV e' lateral:   8.59 cm/s LVOT diam:     2.20 cm   LV E/e' lateral: 9.5 LV SV:         127 LV SV Index:   64 LVOT Area:     3.80 cm   RIGHT VENTRICLE             IVC RV S prime:     15.20 cm/s  IVC diam: 2.40 cm TAPSE (M-mode): 2.8 cm  LEFT ATRIUM             Index        RIGHT ATRIUM           Index LA diam:        3.50 cm 1.78 cm/m   RA Area:     20.10 cm LA Vol (A2C):   67.6 ml 34.35 ml/m  RA Volume:   55.50 ml  28.20 ml/m LA Vol (A4C):   46.7 ml 23.73 ml/m LA Biplane Vol: 58.9 ml 29.93 ml/m AORTIC VALVE LVOT Vmax:   129.00 cm/s LVOT Vmean:  90.500 cm/s LVOT VTI:    0.333 m  AORTA Ao Root diam: 3.00 cm Ao Asc diam:  3.50 cm  MITRAL VALVE MV Area (PHT): 2.75 cm    SHUNTS MV Decel Time: 276 msec    Systemic VTI:  0.33 m MV E velocity: 81.20 cm/s  Systemic Diam: 2.20 cm MV A velocity: 92.80 cm/s MV E/A ratio:  0.88  Toribio Fuel MD Electronically signed by Toribio Fuel MD Signature Date/Time: 08/20/2023/10:00:01 AM    Final          ______________________________________________________________________________________________     Recent Labs: 01/20/2023: TSH 1.300 08/19/2023: Pro Brain Natriuretic Peptide 1,003.0 08/20/2023: B Natriuretic Peptide 195.8 08/21/2023: Magnesium  2.2 09/03/2023: ALT 28; BUN 34; Creatinine 3.12; Hemoglobin 9.4; Platelet Count 181; Potassium 4.1; Sodium 139  Recent Lipid Panel    Component Value Date/Time   CHOL 112 08/21/2023 0709   TRIG 43 08/21/2023 0709   HDL 45 08/21/2023 0709   CHOLHDL 2.5 08/21/2023 0709   VLDL 9 08/21/2023 0709   LDLCALC 58 08/21/2023 0709    History of Present Illness    73 year old male with the above past medical history including near-syncope, chronic diastolic heart failure, hypertension, hyperlipidemia, type 2 diabetes, CKD stage IV, multiple myeloma, anemia, GERD, fibromyalgia, and  anxiety.  He presented to the ED on 08/19/2023  following an episode of near syncope, shortness of breath.  BNP was elevated, troponin was elevated with flat trend.  Chest x-ray was unremarkable, VQ scan was negative.  Echocardiogram showed EF 65 to 70%, normal LV function, no RWMA, normal RV systolic function, biatrial enlargement, small pericardial effusion, dilated IVC, no significant valvular abnormalities. He was hospitalized from 08/19/2018 2/5 to 08/21/2023 in the setting of acute on chronic diastolic heart failure.  Cardiology was consulted.  He was diuresed with IV Lasix .  He was discharged home in stable condition on 08/21/2023.  Outpatient 14-day ZIO monitor preliminary report revealed predominantly sinus rhythm, 4 runs of SVT, rare PACs, occasional PVCs, no significant arrhythmia.  He presents today for follow-up.  Since his hospitalization He has not well from a cardiac standpoint.  He denies any recurrent syncope or presyncope.  He denies chest pain, palpitations, dizziness, dyspnea, PND, orthopnea, weight gain.  He has stable nonpitting bilateral lower extremity edema.  Followed by nephrology, followed by oncology.  Currently undergoing chemo.  BP mildly elevated in office today, improved with recheck.  Generally well-controlled at home.  Follow-up in 6 months, sooner if needed.  Near syncope: Chronic diastolic heart failure: Hypertension: Hyperlipidemia: Type 2 diabetes: CKD stage IV: Multiple myeloma: Disposition:  Home Medications    Current Outpatient Medications  Medication Sig Dispense Refill   amLODipine  (NORVASC ) 5 MG tablet Take 5 mg by mouth 2 (two) times daily.     furosemide  (LASIX ) 40 MG tablet Take 1 tablet (40 mg total) by mouth daily. 30 tablet 0   gabapentin  (NEURONTIN ) 300 MG capsule TAKE 1 CAPSULE BY MOUTH THREE TIMES A DAY 270 capsule 1   JANUVIA 100 MG tablet Take 100 mg by mouth daily.     metoprolol  succinate (TOPROL -XL) 25 MG 24 hr tablet Take 25 mg by mouth  daily.     morphine  (MS CONTIN ) 30 MG 12 hr tablet Take 30 mg by mouth 3 (three) times daily.     omeprazole  (PRILOSEC) 40 MG capsule TAKE 1 CAPSULE BY MOUTH TWICE A DAY 180 capsule 0   ondansetron  (ZOFRAN ) 4 MG tablet Take 1 tablet (4 mg total) by mouth every 6 (six) hours as needed for nausea. 20 tablet 0   oxyCODONE -acetaminophen  (PERCOCET) 10-325 MG tablet Take 1 tablet by mouth every 4 (four) hours.     sildenafil (VIAGRA) 100 MG tablet Take 100 mg by mouth daily as needed for erectile dysfunction.     SUMAtriptan  (IMITREX ) 100 MG tablet Take 100 mg by mouth daily as needed for migraine.     Vitamin D , Ergocalciferol , (DRISDOL ) 50000 UNITS CAPS capsule Take 50,000 Units by mouth every Saturday.      No current facility-administered medications for this visit.   Facility-Administered Medications Ordered in Other Visits  Medication Dose Route Frequency Provider Last Rate Last Admin   0.9 %  sodium chloride  infusion   Intravenous Continuous Franchot Lauraine HERO, NP   Stopped at 10/20/19 1037   furosemide  (LASIX ) injection 40 mg  40 mg Intravenous Once Ennever, Peter R, MD       HYDROmorphone  (DILAUDID ) 1 MG/ML injection            morphine  4 MG/ML injection 4 mg  4 mg Intravenous Q4H PRN Timmy Maude SAUNDERS, MD       sodium chloride  flush (NS) 0.9 % injection 10 mL  10 mL Intravenous PRN Franchot Lauraine HERO, NP   10 mL at 10/20/19 1042     Review of  Systems    ***.  All other systems reviewed and are otherwise negative except as noted above.    Physical Exam    VS:  BP (!) 142/72   Pulse 89   Ht 5' 11 (1.803 m)   Wt 168 lb 12.8 oz (76.6 kg)   SpO2 98%   BMI 23.54 kg/m  , BMI Body mass index is 23.54 kg/m.     GEN: Well nourished, well developed, in no acute distress. HEENT: normal. Neck: Supple, no JVD, carotid bruits, or masses. Cardiac: RRR, no murmurs, rubs, or gallops. No clubbing, cyanosis, edema.  Radials/DP/PT 2+ and equal bilaterally.  Respiratory:  Respirations regular and  unlabored, clear to auscultation bilaterally. GI: Soft, nontender, nondistended, BS + x 4. MS: no deformity or atrophy. Skin: warm and dry, no rash. Neuro:  Strength and sensation are intact. Psych: Normal affect.  Accessory Clinical Findings    ECG personally reviewed by me today -    - no acute changes.   Lab Results  Component Value Date   WBC 5.3 09/03/2023   HGB 9.4 (L) 09/03/2023   HCT 31.1 (L) 09/03/2023   MCV 74.8 (L) 09/03/2023   PLT 181 09/03/2023   Lab Results  Component Value Date   CREATININE 3.12 (H) 09/03/2023   BUN 34 (H) 09/03/2023   NA 139 09/03/2023   K 4.1 09/03/2023   CL 105 09/03/2023   CO2 25 09/03/2023   Lab Results  Component Value Date   ALT 28 09/03/2023   AST 31 09/03/2023   ALKPHOS 90 09/03/2023   BILITOT 0.4 09/03/2023   Lab Results  Component Value Date   CHOL 112 08/21/2023   HDL 45 08/21/2023   LDLCALC 58 08/21/2023   TRIG 43 08/21/2023   CHOLHDL 2.5 08/21/2023    Lab Results  Component Value Date   HGBA1C 5.2 08/19/2023    Assessment & Plan    1.  ***  HYPERTENSION CONTROL Vitals:   09/21/23 1407 09/21/23 1431  BP: (!) 148/66 (!) 142/72    The patient's blood pressure is elevated above target today. {Click here to indicate intervention taken to address high BP   Refresh Note :1} *** In order to address the patient's elevated BP:         Damien JAYSON Braver, NP 09/21/2023, 2:31 PM

## 2023-09-22 ENCOUNTER — Encounter: Admitting: Podiatry

## 2023-09-22 ENCOUNTER — Encounter: Payer: Self-pay | Admitting: Nurse Practitioner

## 2023-09-23 ENCOUNTER — Telehealth: Payer: Self-pay | Admitting: Podiatry

## 2023-09-23 NOTE — Telephone Encounter (Signed)
 DOS- 10/04/2023  METATARSAL OSTEOTOMY 2-5 RT 5TH- [address] 5TH [place] REPAIR RT- [address]  [facility] EFFECTIVE DATE- 02/24/2023  DEDUCTIBLE- $257 REMAINING- $0 OOP- $9350 REMAINING- $4231.44 COINSURANCE- 20%  PER [employer] WEBSITE, NO PRIOR AUTH IS REQUIRED FOR CPT CODES 71691 (4x) AND 71714. DECISION ID# [id number]

## 2023-09-26 ENCOUNTER — Ambulatory Visit: Payer: Self-pay | Admitting: Cardiology

## 2023-09-26 DIAGNOSIS — R55 Syncope and collapse: Secondary | ICD-10-CM | POA: Diagnosis not present

## 2023-09-27 ENCOUNTER — Other Ambulatory Visit: Payer: Self-pay

## 2023-09-27 ENCOUNTER — Encounter
Admission: RE | Admit: 2023-09-27 | Discharge: 2023-09-27 | Disposition: A | Discharge status: Home / Self Care | Source: Ambulatory Visit | Attending: Podiatry | Admitting: Podiatry

## 2023-09-27 VITALS — BP 157/78 | HR 67 | Resp 14 | Ht 71.0 in | Wt 168.2 lb

## 2023-09-27 DIAGNOSIS — Z01818 Encounter for other preprocedural examination: Secondary | ICD-10-CM

## 2023-09-27 HISTORY — DX: Chronic pain syndrome: G89.4

## 2023-09-27 HISTORY — DX: Other hammer toe(s) (acquired), right foot: M20.41

## 2023-09-27 HISTORY — DX: Benign prostatic hyperplasia without lower urinary tract symptoms: N40.0

## 2023-09-27 HISTORY — DX: Presence of other vascular implants and grafts: Z95.828

## 2023-09-27 HISTORY — DX: Supraventricular tachycardia, unspecified: I47.10

## 2023-09-27 HISTORY — DX: Ventricular premature depolarization: I49.3

## 2023-09-27 HISTORY — DX: Chronic diastolic (congestive) heart failure: I50.32

## 2023-09-27 HISTORY — DX: Male erectile dysfunction, unspecified: N52.9

## 2023-09-27 HISTORY — DX: Type 2 diabetes mellitus without complications: E11.9

## 2023-09-27 HISTORY — DX: Family history of other specified conditions: Z84.89

## 2023-09-27 HISTORY — DX: Vitamin D deficiency, unspecified: E55.9

## 2023-09-27 HISTORY — DX: Other dysphagia: R13.19

## 2023-09-27 NOTE — Patient Instructions (Addendum)
 Your procedure is scheduled on:10-04-23 Monday Report to the Registration Desk on the 1st floor of the [facility].Then proceed to the 2nd floor Surgery Desk To find out your arrival time, please call [phone] between 1PM - 3PM on:10-01-23 Friday If your arrival time is 6:00 am, do not arrive before that time as the [facility] entrance doors do not open until 6:00 am.  REMEMBER: Instructions that are not followed completely may result in serious medical risk, up to and including death; or upon the discretion of your surgeon and anesthesiologist your surgery may need to be rescheduled.  Do not eat food after midnight the night before surgery.  No gum chewing or hard candies.  You may however, drink Water up to 2 hours before you are scheduled to arrive for your surgery. Do not drink anything within 2 hours of your scheduled arrival time.  One week prior to surgery:Stop NOW (09-27-23) Stop Anti-inflammatories (NSAIDS) such as Advil, Aleve, Ibuprofen, Motrin, Naproxen, Naprosyn and Aspirin based products such as Excedrin, Goody's Powder, BC Powder. Stop ANY OVER THE COUNTER supplements until after surgery.  You may however, continue to take Tylenol  if needed for pain up until the day of surgery.  Stop sildenafil (VIAGRA) 2 days prior to surgery-Last dose will be on 10-01-23 Friday  Continue taking all of your other prescription medications up until the day of surgery.  ON THE DAY OF SURGERY ONLY TAKE THESE MEDICATIONS WITH SIPS OF WATER: -amLODipine  (NORVASC )  -gabapentin  (NEURONTIN )  -metoprolol  succinate (TOPROL -XL)  -morphine  (MS CONTIN )  -omeprazole  (PRILOSEC)  -oxyCODONE -acetaminophen  (PERCOCET)   No Alcohol  for 24 hours before or after surgery.  No Smoking including e-cigarettes for 24 hours before surgery.  No chewable tobacco products for at least 6 hours before surgery.  No nicotine patches on the day of surgery.  Do not use any recreational drugs for at least a week  (preferably 2 weeks) before your surgery.  Please be advised that the combination of cocaine and anesthesia may have negative outcomes, up to and including death. If you test positive for cocaine, your surgery will be cancelled.  On the morning of surgery brush your teeth with toothpaste and water, you may rinse your mouth with mouthwash if you wish. Do not swallow any toothpaste or mouthwash.  Use CHG Soap as directed on instruction sheet.  Do not wear jewelry, make-up, hairpins, clips or nail polish.  For welded (permanent) jewelry: bracelets, anklets, waist bands, etc.  Please have this removed prior to surgery.  If it is not removed, there is a chance that hospital personnel will need to cut it off on the day of surgery.  Do not wear lotions, powders, or perfumes.   Do not shave body hair from the neck down 48 hours before surgery.  Contact lenses, hearing aids and dentures may not be worn into surgery.  Do not bring valuables to the hospital. [facility] is not responsible for any missing/lost belongings or valuables.   Notify your doctor if there is any change in your medical condition (cold, fever, infection).  Wear comfortable clothing (specific to your surgery type) to the hospital.  After surgery, you can help prevent lung complications by doing breathing exercises.  Take deep breaths and cough every 1-2 hours. Your doctor may order a device called an Incentive Spirometer to help you take deep breaths. When coughing or sneezing, hold a pillow firmly against your incision with both hands. This is called "splinting." Doing this helps protect your  incision. It also decreases belly discomfort.  If you are being admitted to the hospital overnight, leave your suitcase in the car. After surgery it may be brought to your room.  In case of increased patient census, it may be necessary for you, the patient, to continue your postoperative care in the Same Day Surgery department.  If  you are being discharged the day of surgery, you will not be allowed to drive home. You will need a responsible individual to drive you home and stay with you for 24 hours after surgery.   If you are taking public transportation, you will need to have a responsible individual with you.  Please call the Pre-admissions Testing Dept. at [phone] if you have any questions about these instructions.  Surgery Visitation Policy:  Patients having surgery or a procedure may have two visitors.  Children under the age of 72 must have an adult with them who is not the patient.     Preparing for Surgery with CHLORHEXIDINE  GLUCONATE (CHG) Soap  Chlorhexidine  Gluconate (CHG) Soap  o An antiseptic cleaner that kills germs and bonds with the skin to continue killing germs even after washing  o Used for showering the night before surgery and morning of surgery  Before surgery, you can play an important role by reducing the number of germs on your skin.  CHG (Chlorhexidine  gluconate) soap is an antiseptic cleanser which kills germs and bonds with the skin to continue killing germs even after washing.  Please do not use if you have an allergy to CHG or antibacterial soaps. If your skin becomes reddened/irritated stop using the CHG.  1. Shower the NIGHT BEFORE SURGERY and the MORNING OF SURGERY with CHG soap.  2. If you choose to wash your hair, wash your hair first as usual with your normal shampoo.  3. After shampooing, rinse your hair and body thoroughly to remove the shampoo.  4. Use CHG as you would any other liquid soap. You can apply CHG directly to the skin and wash gently with a scrungie or a clean washcloth.  5. Apply the CHG soap to your body only from the neck down. Do not use on open wounds or open sores. Avoid contact with your eyes, ears, mouth, and genitals (private parts). Wash face and genitals (private parts) with your normal soap.  6. Wash thoroughly, paying special  attention to the area where your surgery will be performed.  7. Thoroughly rinse your body with warm water.  8. Do not shower/wash with your normal soap after using and rinsing off the CHG soap.  9. Pat yourself dry with a clean towel.  10. Wear clean pajamas to bed the night before surgery.  12. Place clean sheets on your bed the night of your first shower and do not sleep with pets.  13. Shower again with the CHG soap on the day of surgery prior to arriving at the hospital.  14. Do not apply any deodorants/lotions/powders.  15. Please wear clean clothes to the hospital.   [employer] to address health-related social needs:  [email or web address]

## 2023-09-28 ENCOUNTER — Telehealth: Payer: Self-pay

## 2023-09-28 NOTE — Telephone Encounter (Signed)
   Pre-operative Risk Assessment    Patient Name: [name]  DOB: 02/21/1951 MRN: [id number]   Date of last office visit: 09/21/23 [facility], NP Date of next office visit: NONE   Request for Surgical Clearance    Procedure:  OSTEOTOMY, METATARSAL BONE; CORRECTION, HAMMER TOE  Date of Surgery:  Clearance 10/04/23                                Surgeon:  DR KEVIN PATEL, DPM Surgeon's Group or Practice Name:  [facility] Phone number:  [phone] Fax number:  [phone]   Type of Clearance Requested:   - Medical    Type of Anesthesia:  MAC   Additional requests/questions:    Signed, [name]   09/28/2023, 10:05 AM         Adrian Adrian BRAVO, NP  P Cv Div Preop Callback Request for pre-operative cardiac clearance:   1. What type of surgery is being performed? OSTEOTOMY, METATARSAL BONE; CORRECTION, HAMMER TOE  2. When is this surgery scheduled? 10/04/2023   3. Type of clearance being requested (medical, pharmacy, both)? MEDICAL Note... patient was just seen in the office on 09/21/2023 by [facility], NP-C. I reached out to her to see if clearance could be added. Provider is out of the office until 10/04/2023. Covering provider asked me to send request through the pool as normal.   4. Are there any medications that need to be held prior to surgery? NONE  5. Practice name and name of physician performing surgery? Performing surgeon: Dr. Franky Blanch, DPM Requesting clearance: Adrian Elnor, [name]     6. Anesthesia type (none, local, MAC, general)? MAC  7. What is the office phone and fax number?   Fax: [phone]  ATTENTION: Unable to create telephone message as per your standard workflow. Directed by [facility] providers to send requests for cardiac clearance to this pool for appropriate distribution to provider covering pre-operative clearances.  Adrian Elnor, [name], [name], [name], [name] [facility] Peri-operative  Services Nurse Practitioner Phone: [phone] 09/28/23 9:51 AM

## 2023-09-28 NOTE — Telephone Encounter (Signed)
   Patient Name: [name]  DOB: [birth year] MRN: [id number]  Primary Cardiologist: Lynwood Schilling, MD  Chart reviewed as part of pre-operative protocol coverage.  Patient was last seen in clinic on 09/21/2023 by Damien Braver, NP, he was doing well at that time and denied any further presyncope or syncope.  He denied any chest pain, shortness of breath, palpitations orthopnea or PND.  Patient was contacted today regarding procedure next week, he reports that he is doing well and has been unchanged since his last office visit.  He denies any further presyncope or syncope.  He is able to achieve greater than 4 METS of activity.  Given past medical history and time since last visit, based on ACC/AHA guidelines, [name] is at acceptable risk for the planned procedure without further cardiovascular testing  The patient was advised that if he develops new symptoms prior to surgery to contact our office to arrange for a follow-up visit, and he verbalized understanding.  I will route this recommendation to the requesting party via Epic fax function and remove from pre-op pool.  Please call with questions.  Adrian Escoto D Tynell Winchell, NP 09/28/2023, 10:32 AM

## 2023-09-28 NOTE — Telephone Encounter (Signed)
-----   Message from Dorise CHARLENA Pereyra sent at 09/28/2023  9:51 AM EDT ----- Regarding: Request for pre-operative cardiac clearance Request for pre-operative cardiac clearance:  1. What type of surgery is being performed?  OSTEOTOMY, METATARSAL BONE; CORRECTION, HAMMER TOE  2. When is this surgery scheduled?  10/04/2023  3. Type of clearance being requested (medical, pharmacy, both)? MEDICAL Note... patient was just seen in the office on 09/21/2023 by [facility], NP-C. I reached out to her to see if clearance could be added. Provider is out of the office until 10/04/2023. Covering provider asked me to send request through the pool as normal.   4. Are there any medications that need to be held prior to surgery? NONE  5. Practice name and name of physician performing surgery?  Performing surgeon: Dr. Franky Blanch, DPM Requesting clearance: Dorise Pereyra, FNP-C    6. Anesthesia type (none, local, MAC, general)? MAC  7. What is the office phone and fax number?   Fax: [phone]  ATTENTION: Unable to create telephone message as per your standard workflow. Directed by [facility] providers to send requests for cardiac clearance to this pool for appropriate distribution to provider covering pre-operative clearances.   Dorise Pereyra, MSN, APRN, FNP-C, CEN [facility]  Peri-operative Services Nurse Practitioner Phone: [phone] 09/28/23 9:51 AM

## 2023-09-30 ENCOUNTER — Encounter: Payer: Self-pay | Admitting: Podiatry

## 2023-09-30 ENCOUNTER — Ambulatory Visit (INDEPENDENT_AMBULATORY_CARE_PROVIDER_SITE_OTHER): Admitting: Podiatry

## 2023-09-30 DIAGNOSIS — B351 Tinea unguium: Secondary | ICD-10-CM

## 2023-09-30 DIAGNOSIS — E1151 Type 2 diabetes mellitus with diabetic peripheral angiopathy without gangrene: Secondary | ICD-10-CM

## 2023-09-30 DIAGNOSIS — M79675 Pain in left toe(s): Secondary | ICD-10-CM

## 2023-09-30 DIAGNOSIS — M79674 Pain in right toe(s): Secondary | ICD-10-CM

## 2023-09-30 NOTE — Progress Notes (Signed)
 Perioperative / Anesthesia Services  Pre-Admission Testing Clinical Review / Pre-Operative Anesthesia Consult  Date: 09/30/23  PATIENT DEMOGRAPHICS: Name: [name] DOB: [birth year] MRN:   [id number]  Note: Available PAT nursing documentation and vital signs have been reviewed. Clinical nursing staff has updated patient's PMH/PSHx, current medication list, and drug allergies/intolerances to ensure complete and comprehensive history available to assist care teams in MDM as it pertains to the aforementioned surgical procedure and anticipated anesthetic course. Extensive review of available clinical information personally performed. [facility] updated with any diagnoses/procedures that  may have been inadvertently omitted during his intake with the pre-admission testing department's nursing staff.  PLANNED SURGICAL PROCEDURE(S):   Case: [id number] Date/Time: 10/04/23 0715   Procedures:      OSTEOTOMY, METATARSAL BONE (Right: Toe) - RIGHT 5TH TOE IV SEDATION     CORRECTION, HAMMER TOE (Right: Toe) - RIGHT FIFTH TOE   Anesthesia type: Monitor Anesthesia Care   Diagnosis:      Hammer toe of right foot [M20.41]     Plantar flexed metatarsal, right [M21.6X1]   Pre-op diagnosis: HAMMER TOE OF RIGHT FOOT, PLANTAR FLEXED METATARSAL, RIGHT   Location: [facility] OR ROOM 08 / [facility]   Surgeons: Tobie Franky SQUIBB, DPM        CLINICAL DISCUSSION: [name] is a 73 y.o. male who is submitted for pre-surgical anesthesia review and clearance prior to him undergoing the above procedure. Patient has never been a smoker in the past. 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  Patient is followed by cardiology  Ed, MD). He was last seen in the cardiology clinic on 09/21/2023; notes reviewed. At the time of his clinic visit, was being seen for hospital follow-up.  He reported a recent visit to the ED for near syncope and shortness of breath.  Workup was negative.  He was admitted from 08/13/2023 - 08/21/2023 for acute on chronic diastolic heart failure.  Patient was diuresed and ultimately discharged home in a stable condition with plans for outpatient follow-up.  When seen in the cardiology office, patient reported that he was feeling better overall.  Denied any recurrent chest pain, shortness of breath, PND, orthopnea, palpitations, significant peripheral edema, weakness, fatigue, vertiginous symptoms, or presyncope/syncope. Patient with a past medical history significant for cardiovascular diagnoses. Documented physical exam was grossly benign, providing no evidence of acute exacerbation and/or decompensation of the patient's known cardiovascular conditions.  Most recent TTE performed on 08/20/2023 revealed a normal left ventricular systolic function with an EF of 65-70%. There was moderate concentric LVH.  There were no regional wall motion abnormalities. Left ventricular diastolic Doppler parameters consistent with abnormal relaxation (G1DD). There was mild biatrial dilatation.  Right ventricular size and function normal with a TAPSE measuring 2.8 cm  (normal range >/= 1.6 cm).  Right atrial pressure elevated at 16 mmHg consistent with patient's known pulmonary hypertension.  Aortic valve was mildly calcified.  There was trivial tricuspid and mitral valve regurgitation.  All transvalvular gradients were noted to be normal providing no evidence of hemodynamically significant valvular stenosis. Aorta normal in size with no evidence of ectasia or aneurysmal dilatation.  Long-term cardiac event monitor study performed on 09/21/2023 revealing a predominant underlying normal sinus rhythm.  There were infrequent  runs of SVT with  the longest lasting 9 beats.  Occasional ventricular ectopy was noted.  There was no evidence of sustained arrhythmias or prolonged pauses.  Blood pressure reasonably controlled at 142/72 mmHg on currently prescribed CCB (amlodipine ), diuretic (furosemide ), and beta-blocker (metoprolol  succinate) therapies.  Patient is not currently taking any type of lipid-lowering therapies for his HLD diagnosis and further ASCVD prevention.  T2DM currently will controlled on currently prescribed regimen; last HgbA1c was 5.2% when checked on 08/19/2023. In the setting of known cardiovascular diagnoses, it is important note that patient is on a PDE5i medication (sildenafil) exogenous testosterone  injections for lab confirmed hypotestosteronism and resulting erectile dysfunction. Patient does not have OSAH diagnosis.  Functional capacity limited by patient's age, chronic pain, and other multiple medical comorbidities.  With that said, patient is able to complete all of his ADLs/IADLs without cardiovascular limitation.  Per the [facility]AS[facility], patient is felt to be able to achieve at least 4 METS physical activity without experiencing any significant degree of angina/anginal equivalent symptoms.  No changes were made to his medication regimen. Patient scheduled to follow-up with outpatient cardiology in 6 months or sooner if needed.  TTE  [name] is scheduled for an elective OSTEOTOMY, METATARSAL BONE (Right: Toe); CORRECTION, HAMMER TOE (Right: Toe) on 10/04/2023 with Dr. Franky Blanch, DPM.  Given patient's past medical history significant for cardiovascular diagnoses, presurgical cardiac clearance was sought by the PAT team. Per cardiology, based ACC/AHA guidelines, the patient's past medical history, and the amount of time since his last clinic visit, this patient would be at an overall ACCEPTABLE risk for the planned procedure without further cardiovascular testing or intervention at this time.   Patient  denies previous perioperative complications with anesthesia in the past. In review his EMR, there are no records available for review pertaining to any anesthetic courses within the [facility] system in the recent past.   Patient denies previous perioperative complications with anesthesia in the past.  He reports that his daughter has experienced seizures with anesthesia in the past.  In review his EMR, it is noted that patient underwent a general anesthetic course at [facility] in 04/2021 without documented complications.   MOST RECENT VITAL SIGNS:    09/27/2023   11:43 AM 09/21/2023    2:31 PM 09/21/2023    2:07 PM  Vitals with BMI  Height 5' 11  5' 11  Weight 168 lbs 3 oz  168 lbs 13 oz  BMI 23.47  23.55  Systolic 157 142 851  Diastolic 78 72 66  Pulse 67  89   PROVIDERS/SPECIALISTS: NOTE: Primary physician provider listed below. Patient may have been seen by APP or partner within same practice.   PROVIDER ROLE / SPECIALTY LAST OV  Blanch Franky SQUIBB, DPM Podiatry (Surgeon) 09/30/2023  Sim Emery CROME, MD Primary Care Provider ??  Lavona Agent, MD Cardiology 09/21/2023; preop APP call 09/28/2023  Timmy Coy, MD Medical Oncology/Hematology 09/03/2023  Carlette Boop, MD Nephrology 06/10/2023   ALLERGIES: Allergies  Allergen Reactions   Deltasone [Prednisone] Itching, Dermatitis and Rash   Iodinated Contrast Media Rash    Patient states he was instructed not to take IV contrast.  In 2008 he had an unknown reaction, and was told not to take it again.  He was also told not to take it due to his kidneys.   Iodine Anxiety, Rash and Other (See Comments)    Didn't feel right instructed not to take per MD--something with his port    Ioversol  Other (See Comments)    Unknown reaction    CURRENT HOME MEDICATIONS: No current facility-administered medications for this encounter.    amLODipine  (NORVASC ) 5 MG tablet   furosemide  (LASIX ) 40 MG tablet    gabapentin  (NEURONTIN ) 300 MG capsule   JANUVIA 100 MG tablet   metoprolol  succinate (TOPROL -XL) 25 MG 24 hr tablet   morphine  (MS CONTIN ) 30 MG 12 hr tablet   omeprazole  (PRILOSEC) 40 MG capsule   ondansetron  (ZOFRAN ) 4 MG tablet   oxyCODONE -acetaminophen  (PERCOCET) 10-325 MG tablet   sildenafil (VIAGRA) 100 MG tablet   SUMAtriptan  (IMITREX ) 100 MG tablet   Vitamin D , Ergocalciferol , (DRISDOL ) 50000 UNITS CAPS capsule    0.9 %  sodium chloride  infusion   furosemide  (LASIX ) injection 40 mg   HYDROmorphone  (DILAUDID ) 1 MG/ML injection   morphine  4 MG/ML injection 4 mg   sodium chloride  flush (NS) 0.9 % injection 10 mL   HISTORY: Past Medical History:  Diagnosis Date   Anemia of renal disease 03/12/2011   Anemia, iron  deficiency 03/12/2011   Anxiety disorder    Arthritis    Bilateral renal cysts    BPH (benign prostatic hyperplasia)    Chronic diastolic CHF (congestive heart failure) (HCC)    Chronic pain syndrome    a.) on COT   Chronic use of opiate for therapeutic purpose    CKD (chronic kidney disease), stage IV (HCC)    Diabetic neuropathy (HCC)    Diverticulosis    DM (diabetes mellitus), type 2 (HCC)    ED (erectile dysfunction)    a.) on PDE5i (sildenafil)   Esophageal dysphagia    Family history of adverse reaction to anesthesia    daughter has had seizures during anesthesia   Fibromyalgia    GERD (gastroesophageal reflux disease)    Hammertoe of right foot    Hemorrhoids    Herpes zoster    History of COVID-19 04/03/2019   Hyperlipidemia    Hypertension    Hypotestosteronism 03/17/2012   a.) on exogenous TRT injections (depotestosterone cypionate)   Multiple myeloma (HCC)    Port-A-Cath in place    PSVT (paroxysmal supraventricular tachycardia) (HCC)    PVCs (premature ventricular contractions)    Status post bilateral cataract extraction    Type 2 diabetes mellitus (HCC)    Vitamin D  deficiency    Past Surgical History:  Procedure Laterality Date    BALLOON DILATION N/A 05/16/2021   Procedure: BALLOON DILATION;  Surgeon: Charlanne Groom, MD;  Location: [facility] ENDOSCOPY;  Service: Gastroenterology;  Laterality: N/A;   BIOPSY  05/16/2021   Procedure: BIOPSY;  Surgeon: Charlanne Groom, MD;  Location: [facility] ENDOSCOPY;  Service: Gastroenterology;;   CATARACT EXTRACTION Bilateral    COLONOSCOPY  07/13/2011   Procedure: COLONOSCOPY;  Surgeon: Gwendlyn ONEIDA Buddy, MD,FACG;  Location: [facility] ENDOSCOPY;  Service: Endoscopy;  Laterality: N/A;   CYST EXCISION Right 08/29/2018   Procedure: CYST REMOVAL;  Surgeon: Gail Favorite, MD;  Location: [facility];  Service: General;  Laterality: Right;   ESOPHAGOGASTRODUODENOSCOPY (EGD) WITH PROPOFOL  N/A 05/16/2021   Procedure: ESOPHAGOGASTRODUODENOSCOPY (EGD) WITH PROPOFOL ;  Surgeon: Charlanne Groom, MD;  Location: [facility] ENDOSCOPY;  Service: Gastroenterology;  Laterality: N/A;   FRACTURE SURGERY Left    IR CHEST FLUORO  09/22/2016   IR CHEST FLUORO  04/25/2020   IR FLUORO GUIDE PORT INSERTION RIGHT  09/23/2016   IR REMOVAL TUN ACCESS W/ PORT W/O FL MOD SED  09/23/2016   IR US  GUIDE VASC ACCESS LEFT  09/23/2016  KNEE ARTHROSCOPY Left    KNEE SURGERY     MASS EXCISION Right 05/09/2020   Procedure: RIGHT LONG FINGER EXCISION MASS,;  Surgeon: Murrell Drivers, MD;  Location: [facility];  Service: Orthopedics;  Laterality: Right;   NEVUS EXCISION N/A 08/29/2018   Procedure: , NEVUS LEFT ELBOW;  Surgeon: Gail Favorite, MD;  Location:  SURGERY CENTER;  Service: General;  Laterality: N/A;   POLYPECTOMY  05/16/2021   Procedure: POLYPECTOMY;  Surgeon: Charlanne Groom, MD;  Location: [facility] ENDOSCOPY;  Service: Gastroenterology;;   PORTACATH PLACEMENT     POSTERIOR CERVICAL FUSION/FORAMINOTOMY N/A 06/05/2016   Procedure: LAMINECTOMY CERVICAL 3 - CERVICAL 6 WITH LATERAL MASS FUSION;  Surgeon: Gerldine Maizes, MD;  Location: [facility] OR;  Service: Neurosurgery;  Laterality: N/A;  LAMINECTOMY CERVICAL 3 -  CERVICAL 6 WITH LATERAL MASS FUSION   TONSILLECTOMY     TRANSURETHRAL RESECTION OF PROSTATE  01/2011   Dr. at [facility] in [place]   Family History  Problem Relation Age of Onset   Breast cancer Sister    Breast cancer Mother    Heart disease Mother    Diabetes Sister    Bone cancer Father    Heart disease Sister    Heart disease Daughter    Throat cancer Brother    Stomach cancer Brother    Colon cancer Neg Hx    Social History   Tobacco Use   Smoking status: Never   Smokeless tobacco: Never  Substance Use Topics   Alcohol  use: Not Currently    Alcohol /week: 0.0 standard drinks of alcohol    LABS:  Lab Results  Component Value Date   WBC 5.3 09/03/2023   HGB 9.4 (L) 09/03/2023   HCT 31.1 (L) 09/03/2023   MCV 74.8 (L) 09/03/2023   PLT 181 09/03/2023   Lab Results  Component Value Date   NA 139 09/03/2023   CL 105 09/03/2023   K 4.1 09/03/2023   CO2 25 09/03/2023   BUN 34 (H) 09/03/2023   CREATININE 3.12 (H) 09/03/2023   GFRNONAA 20 (L) 09/03/2023   CALCIUM  9.1 09/03/2023   PHOS 4.5 08/21/2023   ALBUMIN  4.1 09/03/2023   GLUCOSE 92 09/03/2023    ECG: Date: 08/19/2023  Time ECG obtained: 1050 AM Rate: 79 bpm Rhythm: normal sinus Axis (leads I and aVF): Borderline right axis deviation Intervals: PR 200 ms. QRS 91 ms. QTc 439 ms. ST segment and T wave changes: No evidence of acute T wave abnormalities or significant ST segment elevation or depression.  Evidence of a possible, age undetermined, prior infarct:  No Comparison: Similar to previous tracing obtained on 04/30/2019   IMAGING / PROCEDURES: LONG TERM CARDIAC EVENT MONITOR STUDY performed on 09/21/2023 Normal sinus rhythm Infrequent runs of SVT with the longest being 9 beats. Occasional ventricular ectopy  No sustained arrhythmias  TRANSTHORACIC ECHOCARDIOGRAM performed on 08/20/2023 Left ventricular ejection fraction, by estimation, is 65 to 70%. The left ventricle has normal function.  The left ventricle has no regional wall motion abnormalities. There is moderate concentric left ventricular hypertrophy. Left ventricular diastolic parameters are consistent with Grade I diastolic dysfunction (impaired relaxation).  Right ventricular systolic function is normal. The right ventricular size is normal.  Left atrial size was mildly dilated.  Right atrial size was mildly dilated.  A small pericardial effusion is present. The pericardial effusion is posterior to the left ventricle and localized near the right atrium. There is no evidence of cardiac tamponade.  The mitral valve  is normal in structure. Trivial mitral valve regurgitation. No evidence of mitral stenosis.  The aortic valve is tricuspid. There is mild calcification of the aortic valve. Aortic valve regurgitation is not visualized. No aortic stenosis is present.  The inferior vena cava is dilated in size with <50% respiratory variability, suggesting right atrial pressure of 15 mmHg.   CT CHEST ABDOMEN PELVIS WO CONTRAST performed on 06/21/2023 Right chest wall Mediport catheter tip terminates in the SVC.  There is trace pericardial fluid.  Subsegmental atelectatic changes are noted within the lung bases. The lungs are otherwise clear. Left renal cysts are seen.  The abdominal aorta is normal in caliber. Scattered atherosclerotic changes are present.  There are degenerative changes of the spine and bony pelvis. No convincing evidence for malignancy within the chest, abdomen, or pelvis.  IMPRESSION AND PLAN: [name] has been referred for pre-anesthesia review and clearance prior to him undergoing the planned anesthetic and procedural courses. Available labs, pertinent testing, and imaging results were personally reviewed by me in preparation for upcoming operative/procedural course. [facility] medical record has been updated following extensive record review and patient interview with PAT staff.   This patient has been  appropriately cleared by cardiology with an overall ACCEPTABLE risk of patient experiencing significant perioperative cardiovascular complications. Based on clinical review performed today (09/30/23), barring any significant acute changes in the patient's overall condition, it is anticipated that he will be able to proceed with the planned surgical intervention. Any acute changes in clinical condition may necessitate his procedure being postponed and/or cancelled. Patient will meet with anesthesia team (MD and/or CRNA) on the day of his procedure for preoperative evaluation/assessment. Questions regarding anesthetic course will be fielded at that time.   Pre-surgical instructions were reviewed with the patient during his PAT appointment, and questions were fielded to satisfaction by PAT clinical staff. He has been instructed on which medications that he will need to hold prior to surgery, as well as the ones that have been deemed safe/appropriate to take on the day of his procedure. As part of the general education provided by PAT, patient made aware both verbally and in writing, that he would need to abstain from the use of any illegal substances during his perioperative course. He was advised that failure to follow the provided instructions could necessitate case cancellation or result in serious perioperative complications up to and including death. Patient encouraged to contact PAT and/or his surgeon's office to discuss any questions or concerns that may arise prior to surgery; verbalized understanding.   Dorise Pereyra, MSN, APRN, FNP-C, CEN [facility]  Perioperative Services Nurse Practitioner Phone: [phone] Fax: [phone] 09/30/23 9:59 AM  NOTE: This note has been prepared using Dragon dictation software. Despite my best ability to proofread, there is always the potential that unintentional transcriptional errors may still occur from this process.

## 2023-09-30 NOTE — H&P (View-Only) (Signed)
 Perioperative / Anesthesia Services  Pre-Admission Testing Clinical Review / Pre-Operative Anesthesia Consult  Date: 09/30/23  PATIENT DEMOGRAPHICS: Name: [name] DOB: 08/16/1950 MRN:   [id number]  Note: Available PAT nursing documentation and vital signs have been reviewed. Clinical nursing staff has updated patient's PMH/PSHx, current medication list, and drug allergies/intolerances to ensure complete and comprehensive history available to assist care teams in MDM as it pertains to the aforementioned surgical procedure and anticipated anesthetic course. Extensive review of available clinical information personally performed. Arnold PMH and PSHx updated with any diagnoses/procedures that  may have been inadvertently omitted during his intake with the pre-admission testing department's nursing staff.  PLANNED SURGICAL PROCEDURE(S):   Case: [id number] Date/Time: 10/04/23 0715   Procedures:      OSTEOTOMY, METATARSAL BONE (Right: Toe) - RIGHT 5TH TOE IV SEDATION     CORRECTION, HAMMER TOE (Right: Toe) - RIGHT FIFTH TOE   Anesthesia type: Monitor Anesthesia Care   Diagnosis:      Hammer toe of right foot [M20.41]     Plantar flexed metatarsal, right [M21.6X1]   Pre-op diagnosis: HAMMER TOE OF RIGHT FOOT, PLANTAR FLEXED METATARSAL, RIGHT   Location: [facility] OR ROOM 08 / [facility]   Surgeons: Tobie Franky SQUIBB, DPM        CLINICAL DISCUSSION: [name] is a 72 y.o. male who is submitted for pre-surgical anesthesia review and clearance prior to him undergoing the above procedure. Patient has never been a smoker in the past. 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  Patient is followed by cardiology  Ed, MD). He was last seen in the cardiology clinic on 09/21/2023; notes reviewed. At the time of his clinic visit, was being seen for hospital follow-up.  He reported a recent visit to the ED for near syncope and shortness of breath.  Workup was negative.  He was admitted from 08/13/2023 - 08/21/2023 for acute on chronic diastolic heart failure.  Patient was diuresed and ultimately discharged home in a stable condition with plans for outpatient follow-up.  When seen in the cardiology office, patient reported that he was feeling better overall.  Denied any recurrent chest pain, shortness of breath, PND, orthopnea, palpitations, significant peripheral edema, weakness, fatigue, vertiginous symptoms, or presyncope/syncope. Patient with a past medical history significant for cardiovascular diagnoses. Documented physical exam was grossly benign, providing no evidence of acute exacerbation and/or decompensation of the patient's known cardiovascular conditions.  Most recent TTE performed on 08/20/2023 revealed a normal left ventricular systolic function with an EF of 65-70%. There was moderate concentric LVH.  There were no regional wall motion abnormalities. Left ventricular diastolic Doppler parameters consistent with abnormal relaxation (G1DD). There was mild biatrial dilatation.  Right ventricular size and function normal with a TAPSE measuring 2.8 cm  (normal range >/= 1.6 cm).  Right atrial pressure elevated at 16 mmHg consistent with patient's known pulmonary hypertension.  Aortic valve was mildly calcified.  There was trivial tricuspid and mitral valve regurgitation.  All transvalvular gradients were noted to be normal providing no evidence of hemodynamically significant valvular stenosis. Aorta normal in size with no evidence of ectasia or aneurysmal dilatation.  Long-term cardiac event monitor study performed on 09/21/2023 revealing a predominant underlying normal sinus rhythm.  There were infrequent  runs of SVT with  the longest lasting 9 beats.  Occasional ventricular ectopy was noted.  There was no evidence of sustained arrhythmias or prolonged pauses.  Blood pressure reasonably controlled at 142/72 mmHg on currently prescribed CCB (amlodipine ), diuretic (furosemide ), and beta-blocker (metoprolol  succinate) therapies.  Patient is not currently taking any type of lipid-lowering therapies for his HLD diagnosis and further ASCVD prevention.  T2DM currently will controlled on currently prescribed regimen; last HgbA1c was 5.2% when checked on 08/19/2023. In the setting of known cardiovascular diagnoses, it is important note that patient is on a PDE5i medication (sildenafil) exogenous testosterone  injections for lab confirmed hypotestosteronism and resulting erectile dysfunction. Patient does not have OSAH diagnosis.  Functional capacity limited by patient's age, chronic pain, and other multiple medical comorbidities.  With that said, patient is able to complete all of his ADLs/IADLs without cardiovascular limitation.  Per the DASI, patient is felt to be able to achieve at least 4 METS physical activity without experiencing any significant degree of angina/anginal equivalent symptoms.  No changes were made to his medication regimen. Patient scheduled to follow-up with outpatient cardiology in 6 months or sooner if needed.  TTE  [name] is scheduled for an elective OSTEOTOMY, METATARSAL BONE (Right: Toe); CORRECTION, HAMMER TOE (Right: Toe) on 10/04/2023 with Dr. Franky Blanch, DPM.  Given patient's past medical history significant for cardiovascular diagnoses, presurgical cardiac clearance was sought by the PAT team. Per cardiology, based ACC/AHA guidelines, the patient's past medical history, and the amount of time since his last clinic visit, this patient would be at an overall ACCEPTABLE risk for the planned procedure without further cardiovascular testing or intervention at this time.   Patient  denies previous perioperative complications with anesthesia in the past. In review his EMR, there are no records available for review pertaining to any anesthetic courses within the [facility] system in the recent past.   Patient denies previous perioperative complications with anesthesia in the past.  He reports that his daughter has experienced seizures with anesthesia in the past.  In review his EMR, it is noted that patient underwent a general anesthetic course at [facility] ([facility]) in 04/2021 without documented complications.   MOST RECENT VITAL SIGNS:    09/27/2023   11:43 AM 09/21/2023    2:31 PM 09/21/2023    2:07 PM  Vitals with BMI  Height 5' 11  5' 11  Weight 168 lbs 3 oz  168 lbs 13 oz  BMI 23.47  23.55  Systolic 157 142 851  Diastolic 78 72 66  Pulse 67  89   PROVIDERS/SPECIALISTS: NOTE: Primary physician provider listed below. Patient may have been seen by APP or partner within same practice.   PROVIDER ROLE / SPECIALTY LAST OV  Blanch Franky SQUIBB, DPM Podiatry (Surgeon) 09/30/2023  Sim Emery CROME, MD Primary Care Provider ??  Lavona Agent, MD Cardiology 09/21/2023; preop APP call 09/28/2023  Timmy Coy, MD Medical Oncology/Hematology 09/03/2023  Carlette Boop, MD Nephrology 06/10/2023   ALLERGIES: Allergies  Allergen Reactions   Deltasone [Prednisone] Itching, Dermatitis and Rash   Iodinated Contrast Media Rash    Patient states he was instructed not to take IV contrast.  In 2008 he had an unknown reaction, and was told not to take it again.  He was also told not to take it due to his kidneys.   Iodine Anxiety, Rash and Other (See Comments)    Didn't feel right instructed not to take per MD--something with his port    Ioversol  Other (See Comments)    Unknown reaction    CURRENT HOME MEDICATIONS: No current facility-administered medications for this encounter.    amLODipine  (NORVASC ) 5 MG tablet   furosemide  (LASIX ) 40 MG tablet    gabapentin  (NEURONTIN ) 300 MG capsule   JANUVIA 100 MG tablet   metoprolol  succinate (TOPROL -XL) 25 MG 24 hr tablet   morphine  (MS CONTIN ) 30 MG 12 hr tablet   omeprazole  (PRILOSEC) 40 MG capsule   ondansetron  (ZOFRAN ) 4 MG tablet   oxyCODONE -acetaminophen  (PERCOCET) 10-325 MG tablet   sildenafil (VIAGRA) 100 MG tablet   SUMAtriptan  (IMITREX ) 100 MG tablet   Vitamin D , Ergocalciferol , (DRISDOL ) 50000 UNITS CAPS capsule    0.9 %  sodium chloride  infusion   furosemide  (LASIX ) injection 40 mg   HYDROmorphone  (DILAUDID ) 1 MG/ML injection   morphine  4 MG/ML injection 4 mg   sodium chloride  flush (NS) 0.9 % injection 10 mL   HISTORY: Past Medical History:  Diagnosis Date   Anemia of renal disease 03/12/2011   Anemia, iron  deficiency 03/12/2011   Anxiety disorder    Arthritis    Bilateral renal cysts    BPH (benign prostatic hyperplasia)    Chronic diastolic CHF (congestive heart failure) (HCC)    Chronic pain syndrome    a.) on COT   Chronic use of opiate for therapeutic purpose    CKD (chronic kidney disease), stage IV (HCC)    Diabetic neuropathy (HCC)    Diverticulosis    DM (diabetes mellitus), type 2 (HCC)    ED (erectile dysfunction)    a.) on PDE5i (sildenafil)   Esophageal dysphagia    Family history of adverse reaction to anesthesia    daughter has had seizures during anesthesia   Fibromyalgia    GERD (gastroesophageal reflux disease)    Hammertoe of right foot    Hemorrhoids    Herpes zoster    History of COVID-19 04/03/2019   Hyperlipidemia    Hypertension    Hypotestosteronism 03/17/2012   a.) on exogenous TRT injections (depotestosterone cypionate)   Multiple myeloma (HCC)    Port-A-Cath in place    PSVT (paroxysmal supraventricular tachycardia) (HCC)    PVCs (premature ventricular contractions)    Status post bilateral cataract extraction    Type 2 diabetes mellitus (HCC)    Vitamin D  deficiency    Past Surgical History:  Procedure Laterality Date    BALLOON DILATION N/A 05/16/2021   Procedure: BALLOON DILATION;  Surgeon: Charlanne Groom, MD;  Location: [facility] ENDOSCOPY;  Service: Gastroenterology;  Laterality: N/A;   BIOPSY  05/16/2021   Procedure: BIOPSY;  Surgeon: Charlanne Groom, MD;  Location: [facility] ENDOSCOPY;  Service: Gastroenterology;;   CATARACT EXTRACTION Bilateral    COLONOSCOPY  07/13/2011   Procedure: COLONOSCOPY;  Surgeon: Gwendlyn ONEIDA Buddy, MD,FACG;  Location: [facility] ENDOSCOPY;  Service: Endoscopy;  Laterality: N/A;   CYST EXCISION Right 08/29/2018   Procedure: CYST REMOVAL;  Surgeon: Gail Favorite, MD;  Location: [facility];  Service: General;  Laterality: Right;   ESOPHAGOGASTRODUODENOSCOPY (EGD) WITH PROPOFOL  N/A 05/16/2021   Procedure: ESOPHAGOGASTRODUODENOSCOPY (EGD) WITH PROPOFOL ;  Surgeon: Charlanne Groom, MD;  Location: [facility] ENDOSCOPY;  Service: Gastroenterology;  Laterality: N/A;   FRACTURE SURGERY Left    IR CHEST FLUORO  09/22/2016   IR CHEST FLUORO  04/25/2020   IR FLUORO GUIDE PORT INSERTION RIGHT  09/23/2016   IR REMOVAL TUN ACCESS W/ PORT W/O FL MOD SED  09/23/2016   IR US  GUIDE VASC ACCESS LEFT  09/23/2016  KNEE ARTHROSCOPY Left    KNEE SURGERY     MASS EXCISION Right 05/09/2020   Procedure: RIGHT LONG FINGER EXCISION MASS,;  Surgeon: Murrell Drivers, MD;  Location: [facility];  Service: Orthopedics;  Laterality: Right;   NEVUS EXCISION N/A 08/29/2018   Procedure: , NEVUS LEFT ELBOW;  Surgeon: Gail Favorite, MD;  Location: [facility];  Service: General;  Laterality: N/A;   POLYPECTOMY  05/16/2021   Procedure: POLYPECTOMY;  Surgeon: Charlanne Groom, MD;  Location: [facility] ENDOSCOPY;  Service: Gastroenterology;;   PORTACATH PLACEMENT     POSTERIOR CERVICAL FUSION/FORAMINOTOMY N/A 06/05/2016   Procedure: LAMINECTOMY CERVICAL 3 - CERVICAL 6 WITH LATERAL MASS FUSION;  Surgeon: Gerldine Maizes, MD;  Location: [facility] OR;  Service: Neurosurgery;  Laterality: N/A;  LAMINECTOMY CERVICAL 3 -  CERVICAL 6 WITH LATERAL MASS FUSION   TONSILLECTOMY     TRANSURETHRAL RESECTION OF PROSTATE  01/2011   Dr. at [facility] in [place]   Family History  Problem Relation Age of Onset   Breast cancer Sister    Breast cancer Mother    Heart disease Mother    Diabetes Sister    Bone cancer Father    Heart disease Sister    Heart disease Daughter    Throat cancer Brother    Stomach cancer Brother    Colon cancer Neg Hx    Social History   Tobacco Use   Smoking status: Never   Smokeless tobacco: Never  Substance Use Topics   Alcohol  use: Not Currently    Alcohol /week: 0.0 standard drinks of alcohol    LABS:  Lab Results  Component Value Date   WBC 5.3 09/03/2023   HGB 9.4 (L) 09/03/2023   HCT 31.1 (L) 09/03/2023   MCV 74.8 (L) 09/03/2023   PLT 181 09/03/2023   Lab Results  Component Value Date   NA 139 09/03/2023   CL 105 09/03/2023   K 4.1 09/03/2023   CO2 25 09/03/2023   BUN 34 (H) 09/03/2023   CREATININE 3.12 (H) 09/03/2023   GFRNONAA 20 (L) 09/03/2023   CALCIUM  9.1 09/03/2023   PHOS 4.5 08/21/2023   ALBUMIN  4.1 09/03/2023   GLUCOSE 92 09/03/2023    ECG: Date: 08/19/2023  Time ECG obtained: 1050 AM Rate: 79 bpm Rhythm: normal sinus Axis (leads I and aVF): Borderline right axis deviation Intervals: PR 200 ms. QRS 91 ms. QTc 439 ms. ST segment and T wave changes: No evidence of acute T wave abnormalities or significant ST segment elevation or depression.  Evidence of a possible, age undetermined, prior infarct:  No Comparison: Similar to previous tracing obtained on 04/30/2019   IMAGING / PROCEDURES: LONG TERM CARDIAC EVENT MONITOR STUDY performed on 09/21/2023 Normal sinus rhythm Infrequent runs of SVT with the longest being 9 beats. Occasional ventricular ectopy  No sustained arrhythmias  TRANSTHORACIC ECHOCARDIOGRAM performed on 08/20/2023 Left ventricular ejection fraction, by estimation, is 65 to 70%. The left ventricle has normal function.  The left ventricle has no regional wall motion abnormalities. There is moderate concentric left ventricular hypertrophy. Left ventricular diastolic parameters are consistent with Grade I diastolic dysfunction (impaired relaxation).  Right ventricular systolic function is normal. The right ventricular size is normal.  Left atrial size was mildly dilated.  Right atrial size was mildly dilated.  A small pericardial effusion is present. The pericardial effusion is posterior to the left ventricle and localized near the right atrium. There is no evidence of cardiac tamponade.  The mitral valve  is normal in structure. Trivial mitral valve regurgitation. No evidence of mitral stenosis.  The aortic valve is tricuspid. There is mild calcification of the aortic valve. Aortic valve regurgitation is not visualized. No aortic stenosis is present.  The inferior vena cava is dilated in size with <50% respiratory variability, suggesting right atrial pressure of 15 mmHg.   CT CHEST ABDOMEN PELVIS WO CONTRAST performed on 06/21/2023 Right chest wall Mediport catheter tip terminates in the SVC.  There is trace pericardial fluid.  Subsegmental atelectatic changes are noted within the lung bases. The lungs are otherwise clear. Left renal cysts are seen.  The abdominal aorta is normal in caliber. Scattered atherosclerotic changes are present.  There are degenerative changes of the spine and bony pelvis. No convincing evidence for malignancy within the chest, abdomen, or pelvis.  IMPRESSION AND PLAN: [name] has been referred for pre-anesthesia review and clearance prior to him undergoing the planned anesthetic and procedural courses. Available labs, pertinent testing, and imaging results were personally reviewed by me in preparation for upcoming operative/procedural course. [facility] medical record has been updated following extensive record review and patient interview with PAT staff.   This patient has been  appropriately cleared by cardiology with an overall ACCEPTABLE risk of patient experiencing significant perioperative cardiovascular complications. Based on clinical review performed today (09/30/23), barring any significant acute changes in the patient's overall condition, it is anticipated that he will be able to proceed with the planned surgical intervention. Any acute changes in clinical condition may necessitate his procedure being postponed and/or cancelled. Patient will meet with anesthesia team (MD and/or CRNA) on the day of his procedure for preoperative evaluation/assessment. Questions regarding anesthetic course will be fielded at that time.   Pre-surgical instructions were reviewed with the patient during his PAT appointment, and questions were fielded to satisfaction by PAT clinical staff. He has been instructed on which medications that he will need to hold prior to surgery, as well as the ones that have been deemed safe/appropriate to take on the day of his procedure. As part of the general education provided by PAT, patient made aware both verbally and in writing, that he would need to abstain from the use of any illegal substances during his perioperative course. He was advised that failure to follow the provided instructions could necessitate case cancellation or result in serious perioperative complications up to and including death. Patient encouraged to contact PAT and/or his surgeon's office to discuss any questions or concerns that may arise prior to surgery; verbalized understanding.   Dorise Pereyra, MSN, APRN, FNP-C, CEN [facility]  Perioperative Services Nurse Practitioner Phone: [phone] Fax: [phone] 09/30/23 9:59 AM  NOTE: This note has been prepared using Dragon dictation software. Despite my best ability to proofread, there is always the potential that unintentional transcriptional errors may still occur from this process.

## 2023-09-30 NOTE — Progress Notes (Signed)
 This patient returns to my office for at risk foot care.  This patient requires this care by a professional since this patient will be at risk due to having diabetes type 2.  This patient is unable to cut nails himself since the patient cannot reach his nails.These nails are painful walking and wearing shoes.  painful corn between his 4/5 toes right foot.  This patient presents for at risk foot care today.  General Appearance  Alert, conversant and in no acute stress.  Vascular  Dorsalis pedis and posterior tibial  pulses are weakly  palpable  bilaterally.  Capillary return is within normal limits  bilaterally. Cold feet bilaterally.  Absent digital hair.  Neurologic  Senn-Weinstein monofilament wire test within normal limits/diminished right.  LOPS absent left foot. Muscle power within normal limits bilaterally.  Nails Thick disfigured discolored nails with subungual debris  from hallux to fifth toes bilaterally. No evidence of bacterial infection or drainage bilaterally.  Orthopedic  No limitations of motion  feet .  No crepitus or effusions noted.  No bony pathology or digital deformities noted.  HAV  B/L.  Exostosis right foot.Plantar flexed fifth metatarsall B/l.  Skin  normotropic skin with no porokeratosis noted bilaterally.  No signs of infections or ulcers noted.  Callus sub 5th met  B/l asymptomatic.  HM 4th interspace right foot. No infection.  Onychomycosis  Pain in right toes  Pain in left toes    Consent was obtained for treatment procedures.   Mechanical debridement of nails 1-5  bilaterally performed with a nail nipper.  Filed with dremel without incident. Debride callus HM with # 15 blade and dremel tool.  Patient scheduled for surgery with Dr.  Tobie on this Monday.   Return office visit    12 weeks                  Told patient to return for periodic foot care and evaluation due to potential at risk complications.   Cordella Bold DPM

## 2023-10-04 ENCOUNTER — Ambulatory Visit

## 2023-10-04 ENCOUNTER — Encounter
Admission: RE | Disposition: A | Discharge status: Home / Self Care | Payer: Self-pay | Source: Home / Self Care | Attending: Podiatry

## 2023-10-04 ENCOUNTER — Ambulatory Visit: Admitting: Hematology & Oncology

## 2023-10-04 ENCOUNTER — Other Ambulatory Visit

## 2023-10-04 ENCOUNTER — Other Ambulatory Visit: Payer: Self-pay

## 2023-10-04 ENCOUNTER — Other Ambulatory Visit: Payer: Self-pay | Admitting: Podiatry

## 2023-10-04 ENCOUNTER — Ambulatory Visit: Payer: Self-pay | Admitting: Urgent Care

## 2023-10-04 ENCOUNTER — Inpatient Hospital Stay

## 2023-10-04 ENCOUNTER — Ambulatory Visit
Admission: RE | Admit: 2023-10-04 | Discharge: 2023-10-04 | Disposition: A | Discharge status: Home / Self Care | Attending: Podiatry | Admitting: Podiatry

## 2023-10-04 ENCOUNTER — Encounter: Payer: Self-pay | Admitting: Podiatry

## 2023-10-04 DIAGNOSIS — I272 Pulmonary hypertension, unspecified: Secondary | ICD-10-CM | POA: Insufficient documentation

## 2023-10-04 DIAGNOSIS — N184 Chronic kidney disease, stage 4 (severe): Secondary | ICD-10-CM | POA: Diagnosis not present

## 2023-10-04 DIAGNOSIS — C9000 Multiple myeloma not having achieved remission: Secondary | ICD-10-CM | POA: Diagnosis not present

## 2023-10-04 DIAGNOSIS — Z79899 Other long term (current) drug therapy: Secondary | ICD-10-CM | POA: Diagnosis not present

## 2023-10-04 DIAGNOSIS — E1140 Type 2 diabetes mellitus with diabetic neuropathy, unspecified: Secondary | ICD-10-CM | POA: Insufficient documentation

## 2023-10-04 DIAGNOSIS — E291 Testicular hypofunction: Secondary | ICD-10-CM | POA: Diagnosis not present

## 2023-10-04 DIAGNOSIS — L84 Corns and callosities: Secondary | ICD-10-CM | POA: Insufficient documentation

## 2023-10-04 DIAGNOSIS — I471 Supraventricular tachycardia, unspecified: Secondary | ICD-10-CM | POA: Insufficient documentation

## 2023-10-04 DIAGNOSIS — N529 Male erectile dysfunction, unspecified: Secondary | ICD-10-CM | POA: Diagnosis not present

## 2023-10-04 DIAGNOSIS — F1120 Opioid dependence, uncomplicated: Secondary | ICD-10-CM | POA: Insufficient documentation

## 2023-10-04 DIAGNOSIS — I5032 Chronic diastolic (congestive) heart failure: Secondary | ICD-10-CM | POA: Insufficient documentation

## 2023-10-04 DIAGNOSIS — M797 Fibromyalgia: Secondary | ICD-10-CM | POA: Insufficient documentation

## 2023-10-04 DIAGNOSIS — M1990 Unspecified osteoarthritis, unspecified site: Secondary | ICD-10-CM | POA: Diagnosis not present

## 2023-10-04 DIAGNOSIS — Z7984 Long term (current) use of oral hypoglycemic drugs: Secondary | ICD-10-CM | POA: Diagnosis not present

## 2023-10-04 DIAGNOSIS — M2041 Other hammer toe(s) (acquired), right foot: Secondary | ICD-10-CM | POA: Diagnosis present

## 2023-10-04 DIAGNOSIS — E1122 Type 2 diabetes mellitus with diabetic chronic kidney disease: Secondary | ICD-10-CM | POA: Insufficient documentation

## 2023-10-04 DIAGNOSIS — K219 Gastro-esophageal reflux disease without esophagitis: Secondary | ICD-10-CM | POA: Diagnosis not present

## 2023-10-04 DIAGNOSIS — M21271 Flexion deformity, right ankle and toes: Secondary | ICD-10-CM | POA: Insufficient documentation

## 2023-10-04 DIAGNOSIS — L852 Keratosis punctata (palmaris et plantaris): Secondary | ICD-10-CM | POA: Diagnosis not present

## 2023-10-04 DIAGNOSIS — Z01818 Encounter for other preprocedural examination: Secondary | ICD-10-CM

## 2023-10-04 DIAGNOSIS — G894 Chronic pain syndrome: Secondary | ICD-10-CM | POA: Insufficient documentation

## 2023-10-04 DIAGNOSIS — I130 Hypertensive heart and chronic kidney disease with heart failure and stage 1 through stage 4 chronic kidney disease, or unspecified chronic kidney disease: Secondary | ICD-10-CM | POA: Diagnosis not present

## 2023-10-04 DIAGNOSIS — I700 Atherosclerosis of aorta: Secondary | ICD-10-CM | POA: Diagnosis not present

## 2023-10-04 DIAGNOSIS — M216X1 Other acquired deformities of right foot: Secondary | ICD-10-CM | POA: Diagnosis not present

## 2023-10-04 HISTORY — DX: Supraventricular tachycardia, unspecified: I47.10

## 2023-10-04 HISTORY — DX: Cyst of kidney, acquired: N28.1

## 2023-10-04 HISTORY — DX: Long term (current) use of opiate analgesic: Z79.891

## 2023-10-04 HISTORY — PX: HAMMER TOE SURGERY: SHX385

## 2023-10-04 HISTORY — PX: SYNDACTYLIZATION: SHX6627

## 2023-10-04 HISTORY — DX: Atherosclerosis of aorta: I70.0

## 2023-10-04 HISTORY — DX: Cataract extraction status, right eye: Z98.41

## 2023-10-04 HISTORY — DX: Chronic kidney disease, stage 4 (severe): N18.4

## 2023-10-04 HISTORY — PX: METATARSAL OSTEOTOMY: SHX1641

## 2023-10-04 LAB — GLUCOSE, CAPILLARY
Glucose-Capillary: 99 mg/dL (ref 70–99)
Glucose-Capillary: 102 mg/dL — ABNORMAL HIGH (ref 70–99)

## 2023-10-04 SURGERY — OSTEOTOMY, METATARSAL BONE
Anesthesia: General | Site: Toe | Laterality: Right

## 2023-10-04 MED ORDER — ORAL CARE MOUTH RINSE: 15.0000 mL | Freq: Once | OROMUCOSAL | Status: AC

## 2023-10-04 MED ORDER — ACETAMINOPHEN 10 MG/ML IV SOLN
INTRAVENOUS | Status: DC | PRN
  Administered 2023-10-04 (×2): 650 mg via INTRAVENOUS

## 2023-10-04 MED ORDER — DEXAMETHASONE SODIUM PHOSPHATE 10 MG/ML IJ SOLN
INTRAMUSCULAR | Status: AC
  Filled 2023-10-04: qty 1

## 2023-10-04 MED ORDER — BUPIVACAINE HCL (PF) 0.5 % IJ SOLN
INTRAMUSCULAR | Status: DC | PRN
Start: 2023-10-04 — End: 2023-10-04
  Administered 2023-10-04 (×2): 10 mL

## 2023-10-04 MED ORDER — FENTANYL CITRATE (PF) 100 MCG/2ML IJ SOLN
INTRAMUSCULAR | Status: AC
  Filled 2023-10-04: qty 2

## 2023-10-04 MED ORDER — LIDOCAINE HCL (PF) 2 % IJ SOLN
INTRAMUSCULAR | Status: AC
  Filled 2023-10-04: qty 5

## 2023-10-04 MED ORDER — PROPOFOL 1000 MG/100ML IV EMUL
INTRAVENOUS | Status: AC
  Filled 2023-10-04: qty 100

## 2023-10-04 MED ORDER — CEFAZOLIN SODIUM-DEXTROSE 2-4 GM/100ML-% IV SOLN
INTRAVENOUS | Status: AC
  Filled 2023-10-04: qty 100

## 2023-10-04 MED ORDER — ACETAMINOPHEN 10 MG/ML IV SOLN
INTRAVENOUS | Status: AC
  Filled 2023-10-04: qty 100

## 2023-10-04 MED ORDER — OXYCODONE HCL 5 MG PO TABS
5.0000 mg | ORAL_TABLET | Freq: Once | ORAL | Status: AC | PRN
  Administered 2023-10-04 (×2): 5 mg via ORAL

## 2023-10-04 MED ORDER — IBUPROFEN 800 MG PO TABS: 800.0000 mg | ORAL_TABLET | Freq: Four times a day (QID) | ORAL | 1 refills | Status: AC | PRN

## 2023-10-04 MED ORDER — CHLORHEXIDINE GLUCONATE 0.12 % MT SOLN
15.0000 mL | Freq: Once | OROMUCOSAL | Status: AC
  Administered 2023-10-04 (×2): 15 mL via OROMUCOSAL

## 2023-10-04 MED ORDER — MIDAZOLAM HCL 2 MG/2ML IJ SOLN
INTRAMUSCULAR | Status: AC
  Filled 2023-10-04: qty 2

## 2023-10-04 MED ORDER — LIDOCAINE HCL (CARDIAC) PF 100 MG/5ML IV SOSY
PREFILLED_SYRINGE | INTRAVENOUS | Status: DC | PRN
  Administered 2023-10-04 (×2): 80 mg via INTRAVENOUS

## 2023-10-04 MED ORDER — SODIUM CHLORIDE 0.9 % IV SOLN: INTRAVENOUS | Status: DC

## 2023-10-04 MED ORDER — CEFAZOLIN SODIUM-DEXTROSE 2-4 GM/100ML-% IV SOLN
2.0000 g | Freq: Once | INTRAVENOUS | Status: AC
  Administered 2023-10-04 (×2): 2 g via INTRAVENOUS

## 2023-10-04 MED ORDER — OXYCODONE HCL 5 MG/5ML PO SOLN: 5.0000 mg | Freq: Once | ORAL | Status: AC | PRN

## 2023-10-04 MED ORDER — CHLORHEXIDINE GLUCONATE 0.12 % MT SOLN
OROMUCOSAL | Status: AC
  Filled 2023-10-04: qty 15

## 2023-10-04 MED ORDER — BUPIVACAINE HCL (PF) 0.5 % IJ SOLN
INTRAMUSCULAR | Status: AC
  Filled 2023-10-04: qty 30

## 2023-10-04 MED ORDER — MIDAZOLAM HCL 2 MG/2ML IJ SOLN
INTRAMUSCULAR | Status: DC | PRN
  Administered 2023-10-04 (×2): 1 mg via INTRAVENOUS

## 2023-10-04 MED ORDER — ONDANSETRON HCL 4 MG/2ML IJ SOLN
INTRAMUSCULAR | Status: DC | PRN
  Administered 2023-10-04 (×2): 4 mg via INTRAVENOUS

## 2023-10-04 MED ORDER — PROPOFOL 500 MG/50ML IV EMUL
INTRAVENOUS | Status: DC | PRN
  Administered 2023-10-04 (×2): 125 ug/kg/min via INTRAVENOUS
  Administered 2023-10-04 (×4): 20 mg via INTRAVENOUS

## 2023-10-04 MED ORDER — FENTANYL CITRATE (PF) 100 MCG/2ML IJ SOLN
INTRAMUSCULAR | Status: AC
Start: 2023-10-04 — End: 2023-10-04
  Filled 2023-10-04: qty 2

## 2023-10-04 MED ORDER — OXYCODONE-ACETAMINOPHEN 5-325 MG PO TABS
1.0000 | ORAL_TABLET | ORAL | 0 refills | Status: AC | PRN
Start: 2023-10-04 — End: ?

## 2023-10-04 MED ORDER — SEVOFLURANE IN SOLN
RESPIRATORY_TRACT | Status: AC
  Filled 2023-10-04: qty 250

## 2023-10-04 MED ORDER — OXYCODONE HCL 5 MG PO TABS
ORAL_TABLET | ORAL | Status: AC
  Filled 2023-10-04: qty 1

## 2023-10-04 MED ORDER — ONDANSETRON HCL 4 MG/2ML IJ SOLN
INTRAMUSCULAR | Status: AC
  Filled 2023-10-04: qty 2

## 2023-10-04 MED ORDER — FENTANYL CITRATE (PF) 100 MCG/2ML IJ SOLN
25.0000 ug | INTRAMUSCULAR | Status: DC | PRN
  Administered 2023-10-04 (×8): 25 ug via INTRAVENOUS

## 2023-10-04 MED ORDER — PROPOFOL 10 MG/ML IV BOLUS
INTRAVENOUS | Status: AC
  Filled 2023-10-04: qty 40

## 2023-10-04 MED ORDER — 0.9 % SODIUM CHLORIDE (POUR BTL) OPTIME
TOPICAL | Status: DC | PRN
Start: 2023-10-04 — End: 2023-10-04
  Administered 2023-10-04 (×2): 500 mL

## 2023-10-04 SURGICAL SUPPLY — 38 items
BLADE SURG 15 STRL LF DISP TIS (BLADE) ×2 IMPLANT
BLADE SURG MINI STRL (BLADE) IMPLANT
BLADE SW THK.38XMED LNG THN (BLADE) IMPLANT
BNDG ELASTIC 4X5.8 VLCR NS LF (GAUZE/BANDAGES/DRESSINGS) ×1 IMPLANT
BNDG ESMARCH 4X12 STRL LF (GAUZE/BANDAGES/DRESSINGS) ×1 IMPLANT
BNDG GAUZE DERMACEA FLUFF 4 (GAUZE/BANDAGES/DRESSINGS) ×1 IMPLANT
CHLORAPREP W/TINT 26 (MISCELLANEOUS) ×1 IMPLANT
CUFF TOURN SGL QUICK 12 (TOURNIQUET CUFF) IMPLANT
CUFF TOURN SGL QUICK 18X4 (TOURNIQUET CUFF) IMPLANT
ELECTRODE BLDE 4.0 EZ CLN MEGD (MISCELLANEOUS) ×1 IMPLANT
ELECTRODE REM PT RTRN 9FT ADLT (ELECTROSURGICAL) ×1 IMPLANT
GAUZE SPONGE 4X4 12PLY STRL (GAUZE/BANDAGES/DRESSINGS) ×1 IMPLANT
GAUZE XEROFORM 1X8 LF (GAUZE/BANDAGES/DRESSINGS) ×1 IMPLANT
GLOVE BIO SURGEON STRL SZ7.5 (GLOVE) ×1 IMPLANT
GLOVE BIOGEL PI IND STRL 7.0 (GLOVE) ×1 IMPLANT
GLOVE SURG SYN 7.0 PF PI (GLOVE) ×1 IMPLANT
GOWN STRL REUS W/ TWL XL LVL3 (GOWN DISPOSABLE) ×2 IMPLANT
KIT TURNOVER KIT A (KITS) ×1 IMPLANT
LABEL OR SOLS (LABEL) ×1 IMPLANT
MANIFOLD NEPTUNE II (INSTRUMENTS) ×1 IMPLANT
NDL FILTER BLUNT 18X1 1/2 (NEEDLE) ×1 IMPLANT
NDL HYPO 25X1 1.5 SAFETY (NEEDLE) ×3 IMPLANT
NEEDLE FILTER BLUNT 18X1 1/2 (NEEDLE) IMPLANT
NEEDLE HYPO 25X1 1.5 SAFETY (NEEDLE) IMPLANT
NS IRRIG 500ML POUR BTL (IV SOLUTION) ×1 IMPLANT
PACK EXTREMITY ARMC (MISCELLANEOUS) ×1 IMPLANT
PAD ABD DERMACEA PRESS 5X9 (GAUZE/BANDAGES/DRESSINGS) ×1 IMPLANT
PAD CAST 4YDX4 CTTN HI CHSV (CAST SUPPLIES) ×1 IMPLANT
PENCIL SMOKE EVACUATOR (MISCELLANEOUS) ×1 IMPLANT
STOCKINETTE M/LG 89821 (MISCELLANEOUS) ×1 IMPLANT
SUT MNCRL AB 3-0 PS2 27 (SUTURE) ×1 IMPLANT
SUT MNCRL AB 4-0 PS2 18 (SUTURE) ×1 IMPLANT
SUT MNCRL+ 5-0 UNDYED PC-3 (SUTURE) ×1 IMPLANT
SUT PROLENE 3 0 PS 2 (SUTURE) IMPLANT
SUT PROLENE 4 0 PS 2 18 (SUTURE) IMPLANT
SYR 10ML LL (SYRINGE) ×2 IMPLANT
TRAP FLUID SMOKE EVACUATOR (MISCELLANEOUS) ×1 IMPLANT
WIRE Z .062 C-WIRE SPADE TIP (WIRE) IMPLANT

## 2023-10-04 NOTE — Op Note (Addendum)
 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  Plantarflexed fifth metatarsal noted Anesthesia: Monitor Anesthesia Care  Hemostasis:  Total Tourniquet Time Documented: Calf (Right) - 17 minutes Total: Calf (Right) - 17 minutes  EBL: Minimal Materials: 3-0 Prolene 3-0 Monocryl Injectables: 10 cc of half percent Marcaine  plain Complications: None  Indications for surgery: A 73 y.o. male presents with right painful heloma molle deformity with hammertoe contracture fifth digit with plantarflexed fifth metatarsal. Patient has failed all conservative therapy including but not limited to shoe gear modification padding offloading. He wishes to have surgical correction of the foot/deformity. It was determined that patient would benefit from right syndactylization of 4th and 5th digit with arthroplasty of the fifth with plantarflexed fifth metatarsal osteotomy. Informed surgical risk consent was reviewed and read aloud to the patient.  I reviewed the films.  I have discussed my findings with the patient in great detail.  I have discussed all risks including but not limited to infection, stiffness, scarring, limp, disability, deformity, damage to blood vessels and nerves, numbness, poor healing, need for braces, arthritis, chronic pain, amputation, death.  All benefits and realistic expectations discussed in great detail.  I have made no promises as to the outcome.  I have provided realistic expectations.  I have offered the patient a 2nd opinion, which they have declined and assured me they  preferred to proceed despite the risks   Procedure in detail: The patient was both verbally and visually identified by myself, the nursing staff, and anesthesia staff in the preoperative holding area. They were then transferred to the operating room and placed on the operative table in supine position.  Local anesthesia was infiltrated into the skin and subcutaneous tissue. A pneumatic ankle tourniquet was applied. Attention was directed to the dorsolateral aspect of the appropriate foot where a linear incision was made over the distal metatarsal extending beyond the MTP joint. Bleeders were coagulated. A linear capsulotomy was performed in line with the initial skin incision. Subperiosteal dissection was performed with exposure of the metatarsal head and neck dorsal and lateral. An oblique osteotomy was performed from distal lateral to proximal and medial followed by medial shift of the capital fragment. Good correction and alignment noted with reduction of pressure from submetarasal five.   The patient was transported to the operating room placed supine on the table.  Prepped and draped in usual fashion. Attention was directed to the webspace between the fourth and fifth left toes.  A circular incision was made encompassing 2 adjacent hyperkeratotic lesions in order to prepare for syndactylization.  The skin lesions were then removed.  A transverse capsulotomy was performed at the PIPJ of the fifth toe, the head of the proximal phalanx was delivered into the wound.  The head of the phalanx was removed with a bone forcep.  The wound was flushed and the incision was closed from dorsal to plantar with a running horizontal mattress suture.  Syndactylization of 4th and 5th digit was completed.  Incision was well coapted no complication noted.  Good reduction of deformity noted.  At the conclusion of the procedure the patient was awoken from anesthesia and found to have tolerated  the procedure well any  complications. There were transferred to PACU with vital signs stable and vascular status intact.  Zea Kostka, DPM

## 2023-10-04 NOTE — Interval H&P Note (Signed)
 History and Physical Interval Note:  10/04/2023 7:41 AM  [name]  has presented today for surgery, with the diagnosis of HAMMER TOE OF RIGHT FOOT, PLANTAR FLEXED METATARSAL, RIGHT.  The various methods of treatment have been discussed with the patient and family. After consideration of risks, benefits and other options for treatment, the patient has consented to  Procedure(s) with comments: OSTEOTOMY, METATARSAL BONE (Right) - RIGHT 5TH TOE CORRECTION, HAMMER TOE (Right) - RIGHT 5TH TOE SYNDACTYLIZATION (Right) - RIGHT 4TH AND 5TH DIGIT ARTHROPLASTY, TOE (Right) - RIGHT 5TH TOE as a surgical intervention.  The patient's history has been reviewed, patient examined, no change in status, stable for surgery.  I have reviewed the patient's chart and labs.  Questions were answered to the patient's satisfaction.     Franky SHAUNNA Blanch

## 2023-10-04 NOTE — Anesthesia Postprocedure Evaluation (Signed)
 Anesthesia Post Note  Patient: [name]  Procedure(s) Performed: OSTEOTOMY, METATARSAL BONE (Right: Toe) CORRECTION, HAMMER TOE (Right: Toe) SYNDACTYLIZATION (Right: Toe)  Patient location during evaluation: PACU Anesthesia Type: General Level of consciousness: awake and alert Pain management: pain level controlled Vital Signs Assessment: post-procedure vital signs reviewed and stable Respiratory status: spontaneous breathing, nonlabored ventilation, respiratory function stable and patient connected to nasal cannula oxygen Cardiovascular status: blood pressure returned to baseline and stable Postop Assessment: no apparent nausea or vomiting Anesthetic complications: no   No notable events documented.   Last Vitals:  Vitals:   10/04/23 0900 10/04/23 0915  BP: (!) 144/76 (!) 142/74  Pulse: 63 67  Resp: 12 12  Temp:  (!) 36.2 C  SpO2: 100% 99%    Last Pain:  Vitals:   10/04/23 0915  TempSrc:   PainSc: 4                  [name]

## 2023-10-04 NOTE — Transfer of Care (Signed)
 Immediate Anesthesia Transfer of Care Note  Patient: [name]  Procedure(s) Performed: OSTEOTOMY, METATARSAL BONE (Right: Toe) CORRECTION, HAMMER TOE (Right: Toe) SYNDACTYLIZATION (Right: Toe)  Patient Location: PACU  Anesthesia Type:MAC  Level of Consciousness: drowsy and patient cooperative  Airway & Oxygen Therapy: Patient Spontanous Breathing and Patient connected to face mask oxygen  Post-op Assessment: Report given to RN and Post -op Vital signs reviewed and stable  Post vital signs: Reviewed and stable   Last Vitals:  Vitals Value Taken Time  BP 119/60 10/04/23 08:24  Temp    Pulse 70 10/04/23 08:27  Resp 8 10/04/23 08:27  SpO2 100 % 10/04/23 08:27  Vitals shown include unfiled device data.  Last Pain:  Vitals:   10/04/23 0623  TempSrc: Temporal  PainSc: 5          Complications: No notable events documented.

## 2023-10-04 NOTE — Anesthesia Preprocedure Evaluation (Signed)
 Anesthesia Evaluation  Patient identified by MRN, date of birth, ID band Patient awake    Reviewed: Allergy & Precautions, NPO status , Patient's Chart, lab work & pertinent test results  History of Anesthesia Complications Negative for: history of anesthetic complications  Airway Mallampati: III  TM Distance: >3 FB Neck ROM: full    Dental  (+) Upper Dentures   Pulmonary neg pulmonary ROS   Pulmonary exam normal        Cardiovascular hypertension, On Medications +CHF  Normal cardiovascular exam  Echo 6/25 IMPRESSIONS     1. Left ventricular ejection fraction, by estimation, is 65 to 70%. The  left ventricle has normal function. The left ventricle has no regional  wall motion abnormalities. There is moderate concentric left ventricular  hypertrophy. Left ventricular  diastolic parameters are consistent with Grade I diastolic dysfunction  (impaired relaxation).   2. Right ventricular systolic function is normal. The right ventricular  size is normal.   3. Left atrial size was mildly dilated.   4. Right atrial size was mildly dilated.   5. A small pericardial effusion is present. The pericardial effusion is  posterior to the left ventricle and localized near the right atrium. There  is no evidence of cardiac tamponade.   6. The mitral valve is normal in structure. Trivial mitral valve  regurgitation. No evidence of mitral stenosis.   7. The aortic valve is tricuspid. There is mild calcification of the  aortic valve. Aortic valve regurgitation is not visualized. No aortic  stenosis is present.   8. The inferior vena cava is dilated in size with <50% respiratory  variability, suggesting right atrial pressure of 15 mmHg.   EKG: Sinus rhythm Borderline right axis deviation Borderline T wave abnormalities   Neuro/Psych  PSYCHIATRIC DISORDERS Anxiety      Neuromuscular disease    GI/Hepatic Neg liver ROS,GERD  Medicated,,   Endo/Other  negative endocrine ROSdiabetes    Renal/GU CRFRenal disease  negative genitourinary   Musculoskeletal  (+) Arthritis ,  Fibromyalgia -, narcotic dependent  Abdominal   Peds  Hematology  (+) Blood dyscrasia, anemia Multiple myeloma    Anesthesia Other Findings Past Medical History: 03/12/2011: Anemia of renal disease 03/12/2011: Anemia, iron  deficiency No date: Anxiety disorder No date: Aortic atherosclerosis (HCC) No date: Arthritis No date: Bilateral renal cysts No date: BPH (benign prostatic hyperplasia) No date: Chronic diastolic CHF (congestive heart failure) (HCC) No date: Chronic pain syndrome     Comment:  a.) on COT No date: Chronic use of opiate for therapeutic purpose No date: CKD (chronic kidney disease), stage IV (HCC) No date: Diabetic neuropathy (HCC) No date: Diverticulosis No date: DM (diabetes mellitus), type 2 (HCC) No date: ED (erectile dysfunction)     Comment:  a.) on PDE5i (sildenafil) No date: Esophageal dysphagia No date: Family history of adverse reaction to anesthesia     Comment:  daughter has had seizures during anesthesia No date: Fibromyalgia No date: GERD (gastroesophageal reflux disease) No date: Hammertoe of right foot No date: Hemorrhoids No date: Herpes zoster 04/03/2019: History of COVID-19 No date: Hyperlipidemia No date: Hypertension 03/17/2012: Hypotestosteronism     Comment:  a.) on exogenous TRT injections (depotestosterone               cypionate) No date: Multiple myeloma (HCC) No date: Port-A-Cath in place No date: PSVT (paroxysmal supraventricular tachycardia) (HCC) No date: PVCs (premature ventricular contractions) No date: Renal cyst, left No date: Status post bilateral cataract extraction  No date: Type 2 diabetes mellitus (HCC) No date: Vitamin D  deficiency  Past Surgical History: 05/16/2021: BALLOON DILATION; N/A     Comment:  Procedure: BALLOON DILATION;  Surgeon: Charlanne Groom,                MD;  Location: [facility] ENDOSCOPY;  Service: Gastroenterology;               Laterality: N/A; 05/16/2021: BIOPSY     Comment:  Procedure: BIOPSY;  Surgeon: Charlanne Groom, MD;                Location: [facility] ENDOSCOPY;  Service: Gastroenterology;; No date: CATARACT EXTRACTION; Bilateral 07/13/2011: COLONOSCOPY     Comment:  Procedure: COLONOSCOPY;  Surgeon: Gwendlyn ONEIDA Buddy,               MD,FACG;  Location: [facility] ENDOSCOPY;  Service: Endoscopy;                Laterality: N/A; 08/29/2018: CYST EXCISION; Right     Comment:  Procedure: CYST REMOVAL;  Surgeon: Gail Favorite, MD;               Location: [facility];  Service: General;                Laterality: Right; 05/16/2021: ESOPHAGOGASTRODUODENOSCOPY (EGD) WITH PROPOFOL ; N/A     Comment:  Procedure: ESOPHAGOGASTRODUODENOSCOPY (EGD) WITH               PROPOFOL ;  Surgeon: Charlanne Groom, MD;  Location: [facility]               ENDOSCOPY;  Service: Gastroenterology;  Laterality: N/A; No date: FRACTURE SURGERY; Left 09/22/2016: IR CHEST FLUORO 04/25/2020: IR CHEST FLUORO 09/23/2016: IR FLUORO GUIDE PORT INSERTION RIGHT 09/23/2016: IR REMOVAL TUN ACCESS W/ PORT W/O FL MOD SED 09/23/2016: IR US  GUIDE VASC ACCESS LEFT No date: KNEE ARTHROSCOPY; Left No date: KNEE SURGERY 05/09/2020: MASS EXCISION; Right     Comment:  Procedure: RIGHT LONG FINGER EXCISION MASS,;  Surgeon:               Murrell Drivers, MD;  Location: [facility];                Service: Orthopedics;  Laterality: Right; 08/29/2018: NEVUS EXCISION; N/A     Comment:  Procedure: , NEVUS LEFT ELBOW;  Surgeon: Gail Favorite, MD;  Location:  SURGERY CENTER;                Service: General;  Laterality: N/A; 05/16/2021: POLYPECTOMY     Comment:  Procedure: POLYPECTOMY;  Surgeon: Charlanne Groom, MD;                Location: [facility] ENDOSCOPY;  Service: Gastroenterology;; No date: PORTACATH PLACEMENT 06/05/2016: POSTERIOR CERVICAL FUSION/FORAMINOTOMY;  N/A     Comment:  Procedure: LAMINECTOMY CERVICAL 3 - CERVICAL 6 WITH               LATERAL MASS FUSION;  Surgeon: Gerldine Maizes, MD;                Location: [facility] OR;  Service: Neurosurgery;  Laterality:               N/A;  LAMINECTOMY CERVICAL 3 - CERVICAL 6 WITH LATERAL               MASS FUSION No date: TONSILLECTOMY  01/2011: TRANSURETHRAL RESECTION OF PROSTATE     Comment:  Dr. at [facility] in [place]  BMI    Body Mass Index: 23.01 kg/m      Reproductive/Obstetrics negative OB ROS                              Anesthesia Physical Anesthesia Plan  ASA: 3  Anesthesia Plan: General   Post-op Pain Management: Ofirmev  IV (intra-op)* and Toradol  IV (intra-op)*   Induction: Intravenous  PONV Risk Score and Plan: 1 and Propofol  infusion and TIVA  Airway Management Planned: Natural Airway and Nasal Cannula  Additional Equipment:   Intra-op Plan:   Post-operative Plan:   Informed Consent: I have reviewed the patients History and Physical, chart, labs and discussed the procedure including the risks, benefits and alternatives for the proposed anesthesia with the patient or authorized representative who has indicated his/her understanding and acceptance.     Dental Advisory Given  Plan Discussed with: Anesthesiologist, CRNA and Surgeon  Anesthesia Plan Comments: (Patient consented for risks of anesthesia including but not limited to:  - adverse reactions to medications - risk of airway placement if required - damage to eyes, teeth, lips or other oral mucosa - nerve damage due to positioning  - sore throat or hoarseness - Damage to heart, brain, nerves, lungs, other parts of body or loss of life  Patient voiced understanding and assent.)         Anesthesia Quick Evaluation

## 2023-10-05 ENCOUNTER — Encounter: Payer: Self-pay | Admitting: Podiatry

## 2023-10-06 ENCOUNTER — Encounter: Admitting: Podiatry

## 2023-10-13 ENCOUNTER — Ambulatory Visit (INDEPENDENT_AMBULATORY_CARE_PROVIDER_SITE_OTHER)

## 2023-10-13 ENCOUNTER — Ambulatory Visit (INDEPENDENT_AMBULATORY_CARE_PROVIDER_SITE_OTHER): Admitting: Podiatry

## 2023-10-13 ENCOUNTER — Other Ambulatory Visit: Payer: Self-pay | Admitting: Podiatry

## 2023-10-13 DIAGNOSIS — M2041 Other hammer toe(s) (acquired), right foot: Secondary | ICD-10-CM

## 2023-10-13 DIAGNOSIS — E1151 Type 2 diabetes mellitus with diabetic peripheral angiopathy without gangrene: Secondary | ICD-10-CM

## 2023-10-13 DIAGNOSIS — Z9889 Other specified postprocedural states: Secondary | ICD-10-CM

## 2023-10-13 MED ORDER — DOXYCYCLINE HYCLATE 100 MG PO TABS: 100.0000 mg | ORAL_TABLET | Freq: Two times a day (BID) | ORAL | 0 refills | Status: DC

## 2023-10-13 NOTE — Progress Notes (Signed)
 Subjective:  Patient ID: [name], male    DOB: [birth year],  MRN: [id number]  Chief Complaint  Patient presents with   Routine Post Op    POV # 1 DOS 10/04/23 RT 5TH DIGIT ARTHROPLASTY W/ SYNCACTYLIZATION 4TH/5TH, FLOATING OSTEOTOMY PLANTAR FLEXED 5TH.    DOS: 10/04/2023 Procedure: Right fifth digit arthroplasty with syndactylization 4th and 5th with floating osteotomy of the fifth right foot  73 y.o. male returns for post-op check.  Patient states is doing well.  He has been walking a lot on his foot.  He states that he wears a surgical shoe has not been partial weightbearing to the heel.  He has been noncompliant.  He states that he will do better  Review of Systems: Negative except as noted in the HPI. Denies N/V/F/Ch.  Past Medical History:  Diagnosis Date   Anemia of renal disease 03/12/2011   Anemia, iron  deficiency 03/12/2011   Anxiety disorder    Aortic atherosclerosis (HCC)    Arthritis    Bilateral renal cysts    BPH (benign prostatic hyperplasia)    Chronic diastolic CHF (congestive heart failure) (HCC)    Chronic pain syndrome    a.) on COT   Chronic use of opiate for therapeutic purpose    CKD (chronic kidney disease), stage IV (HCC)    Diabetic neuropathy (HCC)    Diverticulosis    DM (diabetes mellitus), type 2 (HCC)    ED (erectile dysfunction)    a.) on PDE5i (sildenafil)   Esophageal dysphagia    Family history of adverse reaction to anesthesia    daughter has had seizures during anesthesia   Fibromyalgia    GERD (gastroesophageal reflux disease)    Hammertoe of right foot    Hemorrhoids    Herpes zoster    History of COVID-19 04/03/2019   Hyperlipidemia    Hypertension    Hypotestosteronism 03/17/2012   a.) on exogenous TRT injections (depotestosterone cypionate)   Multiple myeloma (HCC)    Port-A-Cath in place    PSVT (paroxysmal supraventricular tachycardia) (HCC)    PVCs (premature ventricular contractions)    Renal cyst, left    Status  post bilateral cataract extraction    Type 2 diabetes mellitus (HCC)    Vitamin D  deficiency     Current Outpatient Medications:    doxycycline  (VIBRA -TABS) 100 MG tablet, Take 1 tablet (100 mg total) by mouth 2 (two) times daily., Disp: 60 tablet, Rfl: 0   amLODipine  (NORVASC ) 5 MG tablet, Take 5 mg by mouth 2 (two) times daily., Disp: , Rfl:    furosemide  (LASIX ) 40 MG tablet, Take 1 tablet (40 mg total) by mouth daily., Disp: 30 tablet, Rfl: 0   gabapentin  (NEURONTIN ) 300 MG capsule, TAKE 1 CAPSULE BY MOUTH THREE TIMES A DAY, Disp: 270 capsule, Rfl: 1   ibuprofen  (ADVIL ) 800 MG tablet, Take 1 tablet (800 mg total) by mouth every 6 (six) hours as needed., Disp: 60 tablet, Rfl: 1   JANUVIA 100 MG tablet, Take 100 mg by mouth daily., Disp: , Rfl:    metoprolol  succinate (TOPROL -XL) 25 MG 24 hr tablet, Take 25 mg by mouth in the morning., Disp: , Rfl:    morphine  (MS CONTIN ) 30 MG 12 hr tablet, Take 30 mg by mouth 3 (three) times daily., Disp: , Rfl:    omeprazole  (PRILOSEC) 40 MG capsule, TAKE 1 CAPSULE BY MOUTH TWICE A DAY, Disp: 180 capsule, Rfl: 0   ondansetron  (ZOFRAN ) 4 MG tablet, Take 1 tablet (  4 mg total) by mouth every 6 (six) hours as needed for nausea., Disp: 20 tablet, Rfl: 0   oxyCODONE -acetaminophen  (PERCOCET) 10-325 MG tablet, Take 1 tablet by mouth every 4 (four) hours., Disp: , Rfl:    oxyCODONE -acetaminophen  (PERCOCET) 5-325 MG tablet, Take 1 tablet by mouth every 4 (four) hours as needed for severe pain (pain score 7-10)., Disp: 30 tablet, Rfl: 0   sildenafil (VIAGRA) 100 MG tablet, Take 100 mg by mouth daily as needed for erectile dysfunction., Disp: , Rfl:    SUMAtriptan  (IMITREX ) 100 MG tablet, Take 100 mg by mouth daily as needed for migraine., Disp: , Rfl:    Vitamin D , Ergocalciferol , (DRISDOL ) 50000 UNITS CAPS capsule, Take 50,000 Units by mouth every Saturday. , Disp: , Rfl:  No current facility-administered medications for this visit.  Facility-Administered  Medications Ordered in Other Visits:    0.9 %  sodium chloride  infusion, , Intravenous, Continuous, Franchot Lauraine HERO, NP, Stopped at 10/20/19 1037   furosemide  (LASIX ) injection 40 mg, 40 mg, Intravenous, Once, Ennever, Maude SAUNDERS, MD   HYDROmorphone  (DILAUDID ) 1 MG/ML injection, , , ,    morphine  4 MG/ML injection 4 mg, 4 mg, Intravenous, Q4H PRN, Ennever, Peter R, MD   sodium chloride  flush (NS) 0.9 % injection 10 mL, 10 mL, Intravenous, PRN, Franchot Lauraine HERO, NP, 10 mL at 10/20/19 1042  Social History   Tobacco Use  Smoking Status Never  Smokeless Tobacco Never    Allergies  Allergen Reactions   Deltasone [Prednisone] Itching, Dermatitis and Rash   Iodinated Contrast Media Rash    Patient states he was instructed not to take IV contrast.  In 2008 he had an unknown reaction, and was told not to take it again.  He was also told not to take it due to his kidneys.   Iodine Anxiety, Rash and Other (See Comments)    Didn't feel right instructed not to take per MD--something with his port    Ioversol Other (See Comments)    Unknown reaction   Objective:  There were no vitals filed for this visit. There is no height or weight on file to calculate BMI. Constitutional Well developed. Well nourished.  Vascular Foot warm and well perfused. Capillary refill normal to all digits.   Neurologic Normal speech. Oriented to person, place, and time. Epicritic sensation to light touch grossly present bilaterally.  Dermatologic Skin healing well without signs of infection. Skin edges well coapted without signs of infection.  Superficial dehiscence noted of the syndactylization site.  No deep probing of bone noted.  No signs of infection noted  Orthopedic: Tenderness to palpation noted about the surgical site.   Radiographs: 3 views skeletally mature the right foot: Good correction alignment noted.  Reduction of hammertoe contracture noted.  Osteotomy of the fifth metatarsal noted. Assessment:    1. Hammertoe of right foot   2. Type 2 diabetes mellitus with diabetic peripheral angiopathy without gangrene, without long-term current use of insulin  (HCC)   3. Status post foot surgery    Plan:  Patient was evaluated and treated and all questions answered.  S/p foot surgery right -Progressing as expected post-operatively. -XR: None -WB Status: Partial weightbearing to the heel in surgical shoe -Sutures: Intact.  No clinical signs of Deis is noted no complication noted. -Medications: None -Foot redressed. - Encourage patient to do daily changes with dry dressing patient is allergic to Betadine so cannot do iodine dressings at this time  No follow-ups on file.

## 2023-10-21 ENCOUNTER — Ambulatory Visit (INDEPENDENT_AMBULATORY_CARE_PROVIDER_SITE_OTHER): Admitting: Podiatry

## 2023-10-21 ENCOUNTER — Encounter: Payer: Self-pay | Admitting: Podiatry

## 2023-10-21 DIAGNOSIS — L84 Corns and callosities: Secondary | ICD-10-CM

## 2023-10-21 DIAGNOSIS — M216X1 Other acquired deformities of right foot: Secondary | ICD-10-CM

## 2023-10-21 NOTE — Progress Notes (Signed)
 This patient presents to the office 1 week after right foot surgery.  He has floating osteotomy 5th met right and syndactaly 4/5 right foot performed by Dr.  Tobie. He presents to the office wearing no bandage.  He denies pain or drainage from the surgical site.  He says he is doing well and is pleased with the surgery.  Examination of right foot reveals healing at the site of the syndactalization.  Sutures intact.  Examination of the incision of the osteotomy reveals gapping at the surgical site.  Necrotic tissue is noted.  No infection at the surgical sites.  S/P surgery  ROV.  Dr.  Prentice Ovens was consulted who evaluated his surgical site.  He cleaned the site and removed loose sutures that were no longer helping with incision healing. The skin was not yet healed and so he will follow-up again in 1 week for suture removal. Today, abx ointment and a dry sterile dressing was applied. He was instructed to keep his dressing on until next visit.    GAM

## 2023-10-22 ENCOUNTER — Ambulatory Visit: Admitting: Podiatry

## 2023-10-26 ENCOUNTER — Inpatient Hospital Stay

## 2023-10-26 ENCOUNTER — Encounter: Payer: Self-pay | Admitting: Family

## 2023-10-26 ENCOUNTER — Inpatient Hospital Stay: Attending: Hematology & Oncology

## 2023-10-26 ENCOUNTER — Encounter: Payer: Self-pay | Admitting: Hematology & Oncology

## 2023-10-26 ENCOUNTER — Other Ambulatory Visit (HOSPITAL_BASED_OUTPATIENT_CLINIC_OR_DEPARTMENT_OTHER): Payer: Self-pay

## 2023-10-26 ENCOUNTER — Inpatient Hospital Stay (HOSPITAL_BASED_OUTPATIENT_CLINIC_OR_DEPARTMENT_OTHER): Admitting: Hematology & Oncology

## 2023-10-26 VITALS — BP 122/62 | HR 72 | Resp 18

## 2023-10-26 VITALS — BP 131/57 | HR 77 | Temp 98.4°F | Resp 18 | Ht 71.0 in | Wt 159.4 lb

## 2023-10-26 DIAGNOSIS — E119 Type 2 diabetes mellitus without complications: Secondary | ICD-10-CM | POA: Diagnosis not present

## 2023-10-26 DIAGNOSIS — N189 Chronic kidney disease, unspecified: Secondary | ICD-10-CM | POA: Insufficient documentation

## 2023-10-26 DIAGNOSIS — D500 Iron deficiency anemia secondary to blood loss (chronic): Secondary | ICD-10-CM

## 2023-10-26 DIAGNOSIS — C9000 Multiple myeloma not having achieved remission: Secondary | ICD-10-CM

## 2023-10-26 DIAGNOSIS — Z299 Encounter for prophylactic measures, unspecified: Secondary | ICD-10-CM

## 2023-10-26 DIAGNOSIS — N184 Chronic kidney disease, stage 4 (severe): Secondary | ICD-10-CM | POA: Diagnosis not present

## 2023-10-26 DIAGNOSIS — E349 Endocrine disorder, unspecified: Secondary | ICD-10-CM | POA: Diagnosis not present

## 2023-10-26 DIAGNOSIS — D631 Anemia in chronic kidney disease: Secondary | ICD-10-CM

## 2023-10-26 DIAGNOSIS — E538 Deficiency of other specified B group vitamins: Secondary | ICD-10-CM

## 2023-10-26 DIAGNOSIS — E291 Testicular hypofunction: Secondary | ICD-10-CM | POA: Insufficient documentation

## 2023-10-26 LAB — RETICULOCYTES
Immature Retic Fract: 8.3 % (ref 2.3–15.9)
RBC.: 4.11 MIL/uL — ABNORMAL LOW (ref 4.22–5.81)
Retic Count, Absolute: 20.6 K/uL (ref 19.0–186.0)
Retic Ct Pct: 0.5 % (ref 0.4–3.1)

## 2023-10-26 LAB — CMP (CANCER CENTER ONLY)
ALT: 31 U/L (ref 0–44)
AST: 35 U/L (ref 15–41)
Albumin: 4.1 g/dL (ref 3.5–5.0)
Alkaline Phosphatase: 112 U/L (ref 38–126)
Anion gap: 15 (ref 5–15)
BUN: 57 mg/dL — ABNORMAL HIGH (ref 8–23)
CO2: 22 mmol/L (ref 22–32)
Calcium: 9.1 mg/dL (ref 8.9–10.3)
Chloride: 103 mmol/L (ref 98–111)
Creatinine: 3.41 mg/dL — ABNORMAL HIGH (ref 0.61–1.24)
GFR, Estimated: 18 mL/min — ABNORMAL LOW (ref >60)
Glucose, Bld: 133 mg/dL — ABNORMAL HIGH (ref 70–99)
Potassium: 3.6 mmol/L (ref 3.5–5.1)
Sodium: 140 mmol/L (ref 135–145)
Total Bilirubin: 0.3 mg/dL (ref 0.0–1.2)
Total Protein: 8.2 g/dL — ABNORMAL HIGH (ref 6.5–8.1)

## 2023-10-26 LAB — CBC WITH DIFFERENTIAL (CANCER CENTER ONLY)
Abs Immature Granulocytes: 0.03 K/uL (ref 0.00–0.07)
Basophils Absolute: 0.1 K/uL (ref 0.0–0.1)
Basophils Relative: 1 %
Eosinophils Absolute: 0.2 K/uL (ref 0.0–0.5)
Eosinophils Relative: 3 %
HCT: 30.6 % — ABNORMAL LOW (ref 39.0–52.0)
Hemoglobin: 9.3 g/dL — ABNORMAL LOW (ref 13.0–17.0)
Immature Granulocytes: 0 %
Lymphocytes Relative: 17 %
Lymphs Abs: 1.2 K/uL (ref 0.7–4.0)
MCH: 22.4 pg — ABNORMAL LOW (ref 26.0–34.0)
MCHC: 30.4 g/dL (ref 30.0–36.0)
MCV: 73.7 fL — ABNORMAL LOW (ref 80.0–100.0)
Monocytes Absolute: 0.8 K/uL (ref 0.1–1.0)
Monocytes Relative: 11 %
Neutro Abs: 4.7 K/uL (ref 1.7–7.7)
Neutrophils Relative %: 68 %
Platelet Count: 210 K/uL (ref 150–400)
RBC: 4.15 MIL/uL — ABNORMAL LOW (ref 4.22–5.81)
RDW: 21.1 % — ABNORMAL HIGH (ref 11.5–15.5)
WBC Count: 7 K/uL (ref 4.0–10.5)
nRBC: 0 % (ref 0.0–0.2)

## 2023-10-26 LAB — LACTATE DEHYDROGENASE: LDH: 308 U/L — ABNORMAL HIGH (ref 98–192)

## 2023-10-26 LAB — FERRITIN: Ferritin: 1042 ng/mL — ABNORMAL HIGH (ref 24–336)

## 2023-10-26 LAB — VITAMIN B12: Vitamin B-12: 889 pg/mL (ref 180–914)

## 2023-10-26 MED ORDER — HYDROMORPHONE HCL 1 MG/ML IJ SOLN
2.0000 mg | Freq: Once | INTRAMUSCULAR | Status: AC
  Administered 2023-10-26: 2 mg via INTRAVENOUS
  Filled 2023-10-26: qty 2

## 2023-10-26 MED ORDER — EPOETIN ALFA-EPBX 40000 UNIT/ML IJ SOLN
40000.0000 [IU] | Freq: Once | INTRAMUSCULAR | Status: AC
  Administered 2023-10-26: 40000 [IU] via SUBCUTANEOUS
  Filled 2023-10-26: qty 1

## 2023-10-26 MED ORDER — TESTOSTERONE CYPIONATE 200 MG/ML IM SOLN
400.0000 mg | Freq: Once | INTRAMUSCULAR | Status: AC
  Administered 2023-10-26: 400 mg via INTRAMUSCULAR
  Filled 2023-10-26: qty 2

## 2023-10-26 MED ORDER — CYANOCOBALAMIN 1000 MCG/ML IJ SOLN
1000.0000 ug | Freq: Once | INTRAMUSCULAR | Status: AC
  Administered 2023-10-26: 1000 ug via INTRAMUSCULAR
  Filled 2023-10-26: qty 1

## 2023-10-26 MED ORDER — FLUZONE HIGH-DOSE 0.5 ML IM SUSY
0.5000 mL | PREFILLED_SYRINGE | Freq: Once | INTRAMUSCULAR | 0 refills | Status: AC
  Filled 2023-10-26: qty 0.5, 1d supply, fill #0

## 2023-10-26 NOTE — Progress Notes (Signed)
 Hematology and Oncology Follow Up Visit  [name] [id number] Jul 24, 1950 73 y.o. 10/26/2023   Principle Diagnosis:  IgG kappa myeloma Anemia secondary to renal insufficiency Intermittent iron  - deficiency anemia Hypotestosteronemia   Prior Therapy:        Kyprolis /Cytoxan  - s/p cycle #40 --DC on 12/24/2022   Current Therapy:  Aredia  60 mg IV q  month -- Started on 07/2022 Retacrit  SQ as needed for hemoglobin less than 10 DepoTestosterone 400 mg q 4 weeks B12 IM monthly IV iron  as indicated        Interim History:  [name] is here today for follow-up.  He had surgery for his right foot about 2 weeks ago.  I think he had a toe removed.  I hate that he had to have this done.  He seems to be managing well as well as possible.  He had a decent Labor Day weekend.  So far, there is been no monoclonal spike in his blood.  His last IgG level was 2111 mg/dL.  The Kappa light chain was down to 8.9 mg/dL.  His blood sugars seem to be doing okay.  His weight is still down a little bit.  He has had no fever.  He has had no bleeding.  Is had no change in bowel or bladder habits.  His last testosterone  level back in July was 400 ng/dL.  His last iron  studies that were done back in July showed a ferritin of almost 2000 with iron  saturation of 50%.  Again, most the ferritin elevation is inflammatory.  Overall, I would say that his performance status is probably ECOG 2.   Medications:  Allergies as of 10/26/2023       Reactions   Deltasone [prednisone] Itching, Dermatitis, Rash   Iodinated Contrast Media Rash   Patient states he was instructed not to take IV contrast.  In 2008 he had an unknown reaction, and was told not to take it again.  He was also told not to take it due to his kidneys.   Iodine Anxiety, Rash, Other (See Comments)   Didn't feel right instructed not to take per MD--something with his port   Ioversol Other (See Comments)   Unknown reaction         Medication List        Accurate as of October 26, 2023  8:07 AM. If you have any questions, ask your nurse or doctor.          amLODipine  5 MG tablet Commonly known as: NORVASC  Take 5 mg by mouth 2 (two) times daily.   doxycycline  100 MG tablet Commonly known as: VIBRA -TABS Take 1 tablet (100 mg total) by mouth 2 (two) times daily.   furosemide  40 MG tablet Commonly known as: LASIX  Take 1 tablet (40 mg total) by mouth daily.   gabapentin  300 MG capsule Commonly known as: NEURONTIN  TAKE 1 CAPSULE BY MOUTH THREE TIMES A DAY   ibuprofen  800 MG tablet Commonly known as: ADVIL  Take 1 tablet (800 mg total) by mouth every 6 (six) hours as needed.   Januvia 100 MG tablet Generic drug: sitaGLIPtin Take 100 mg by mouth daily.   metoprolol  succinate 25 MG 24 hr tablet Commonly known as: TOPROL -XL Take 25 mg by mouth in the morning.   morphine  30 MG 12 hr tablet Commonly known as: MS CONTIN  Take 30 mg by mouth 3 (three) times daily.   omeprazole  40 MG capsule Commonly known as: PRILOSEC TAKE 1 CAPSULE BY MOUTH  TWICE A DAY   ondansetron  4 MG tablet Commonly known as: ZOFRAN  Take 1 tablet (4 mg total) by mouth every 6 (six) hours as needed for nausea.   oxyCODONE -acetaminophen  10-325 MG tablet Commonly known as: PERCOCET Take 1 tablet by mouth every 4 (four) hours.   oxyCODONE -acetaminophen  5-325 MG tablet Commonly known as: Percocet Take 1 tablet by mouth every 4 (four) hours as needed for severe pain (pain score 7-10).   OxyCONTIN  20 MG 12 hr tablet Generic drug: oxyCODONE  Take 20 mg by mouth every 12 (twelve) hours.   sildenafil 100 MG tablet Commonly known as: VIAGRA Take 100 mg by mouth daily as needed for erectile dysfunction.   SUMAtriptan  100 MG tablet Commonly known as: IMITREX  Take 100 mg by mouth daily as needed for migraine.   torsemide  20 MG tablet Commonly known as: DEMADEX  Take 20 mg by mouth 2 (two) times daily.   Vitamin D   (Ergocalciferol ) 1.25 MG (50000 UNIT) Caps capsule Commonly known as: DRISDOL  Take 50,000 Units by mouth every Saturday.        Allergies:  Allergies  Allergen Reactions   Deltasone [Prednisone] Itching, Dermatitis and Rash   Iodinated Contrast Media Rash    Patient states he was instructed not to take IV contrast.  In 2008 he had an unknown reaction, and was told not to take it again.  He was also told not to take it due to his kidneys.   Iodine Anxiety, Rash and Other (See Comments)    Didn't feel right instructed not to take per MD--something with his port    Ioversol Other (See Comments)    Unknown reaction    Past Medical History, Surgical history, Social history, and Family History were reviewed and updated.  Review of Systems:  Review of Systems  Constitutional:  Positive for malaise/fatigue.  HENT: Negative.    Eyes: Negative.   Respiratory: Negative.    Cardiovascular: Negative.   Gastrointestinal: Negative.   Genitourinary: Negative.   Musculoskeletal:  Positive for joint pain.  Skin: Negative.   Neurological: Negative.   Endo/Heme/Allergies: Negative.   Psychiatric/Behavioral: Negative.       Physical Exam:  Vital signs show temperature of 98.4.  Pulse 77.  Blood pressure 131/57.  Weight is 159 pounds.  Wt Readings from Last 3 Encounters:  10/04/23 165 lb (74.8 kg)  09/27/23 168 lb 3.2 oz (76.3 kg)  09/21/23 168 lb 12.8 oz (76.6 kg)   Physical Exam Vitals reviewed.  HENT:     Head: Normocephalic and atraumatic.  Eyes:     Pupils: Pupils are equal, round, and reactive to light.  Cardiovascular:     Rate and Rhythm: Normal rate and regular rhythm.     Heart sounds: Normal heart sounds.  Pulmonary:     Effort: Pulmonary effort is normal.     Breath sounds: Normal breath sounds.  Abdominal:     General: Bowel sounds are normal.     Palpations: Abdomen is soft.  Musculoskeletal:        General: No tenderness or deformity. Normal range of  motion.     Cervical back: Normal range of motion.     Comments: He has a walking sandal on the right foot.  Lymphadenopathy:     Cervical: No cervical adenopathy.  Skin:    General: Skin is warm and dry.     Findings: No erythema or rash.  Neurological:     Mental Status: He is alert and oriented to person, place,  and time.  Psychiatric:        Behavior: Behavior normal.        Thought Content: Thought content normal.        Judgment: Judgment normal.      Lab Results  Component Value Date   WBC 5.3 09/03/2023   HGB 9.4 (L) 09/03/2023   HCT 31.1 (L) 09/03/2023   MCV 74.8 (L) 09/03/2023   PLT 181 09/03/2023   Lab Results  Component Value Date   FERRITIN 1,966 (H) 09/03/2023   IRON  98 09/03/2023   TIBC 195 (L) 09/03/2023   UIBC 97 (L) 09/03/2023   IRONPCTSAT 50 (H) 09/03/2023   Lab Results  Component Value Date   RETICCTPCT 0.5 10/26/2023   RBC 4.11 (L) 10/26/2023   RETICCTABS 27.5 03/21/2014   Lab Results  Component Value Date   KPAFRELGTCHN 89.2 (H) 09/03/2023   LAMBDASER 56.0 (H) 09/03/2023   KAPLAMBRATIO 1.59 09/03/2023   Lab Results  Component Value Date   IGGSERUM 2,111 (H) 09/03/2023   IGA 265 09/03/2023   IGMSERUM 44 09/03/2023   Lab Results  Component Value Date   TOTALPROTELP 7.6 09/03/2023   ALBUMINELP 3.7 09/03/2023   A1GS 0.2 09/03/2023   A2GS 0.8 09/03/2023   BETS 1.0 09/03/2023   BETA2SER 0.5 11/07/2014   GAMS 1.9 (H) 09/03/2023   MSPIKE Not Observed 09/03/2023   SPEI Comment 05/06/2023     Chemistry      Component Value Date/Time   NA 139 09/03/2023 0820   NA 143 02/22/2017 0744   K 4.1 09/03/2023 0820   K 4.0 02/22/2017 0744   CL 105 09/03/2023 0820   CL 106 02/22/2017 0744   CO2 25 09/03/2023 0820   CO2 27 02/22/2017 0744   BUN 34 (H) 09/03/2023 0820   BUN 26 (H) 02/22/2017 0744   CREATININE 3.12 (H) 09/03/2023 0820   CREATININE 2.3 (H) 02/22/2017 0744      Component Value Date/Time   CALCIUM  9.1 09/03/2023 0820    CALCIUM  9.3 02/22/2017 0744   ALKPHOS 90 09/03/2023 0820   ALKPHOS 80 02/22/2017 0744   AST 31 09/03/2023 0820   ALT 28 09/03/2023 0820   ALT 22 02/22/2017 0744   BILITOT 0.4 09/03/2023 0820       Impression and Plan: Mr. Burkey is a very pleasant 73 yo African American gentleman with IgG kappa myeloma.  We are holding his treatment.  He has been off treatment now for quite a while.  I am so happy that his myeloma levels really have not been a problem.  He does other issues.  He has diabetes.  He has I think systemic arthritic issues.  He did have the surgery for his right foot.  He will get his testosterone  today.  This does seem to help his quality of life.  Of note, his vitamin B12 level today was 889 pcg/mL.  I am holding his Aredia  today because of his recent surgery.   I would like to have him come back in about 5-6 weeks.  Maude JONELLE Crease, MD 9/2/20258:07 AM

## 2023-10-26 NOTE — Patient Instructions (Signed)

## 2023-10-26 NOTE — Patient Instructions (Signed)
 Testosterone Injection What is this medication? TESTOSTERONE (tes TOS ter one) is used to increase testosterone levels in your body. It belongs to a group of medications called androgen hormones. This medicine may be used for other purposes; ask your health care provider or pharmacist if you have questions. COMMON BRAND NAME(S): Andro-L.A., Aveed, Azmiro, Delatestryl, Depo-Testosterone, Testone CIK, Virilon What should I tell my care team before I take this medication? They need to know if you have any of these conditions: Cancer Diabetes Heart disease Kidney disease Liver disease Lung disease Prostate disease An unusual or allergic reaction to testosterone, other medications, foods, dyes, or preservatives If you or your partner are pregnant or trying to get pregnant Breastfeeding How should I use this medication? This medication is injected into a muscle. It is given by your care team in a hospital or clinic setting. A special MedGuide will be given to you by the pharmacist with each prescription and refill. Be sure to read this information carefully each time. Contact your care team about the use of this medication in children. While it be prescribed for children as young as 12 years for selected conditions, precautions do apply. Overdosage: If you think you have taken too much of this medicine contact a poison control center or emergency room at once. NOTE: This medicine is only for you. Do not share this medicine with others. What if I miss a dose? Try not to miss a dose. Your care team will tell you when your next injection is due. Notify the office if you are unable to keep an appointment. What may interact with this medication? Medications for diabetes Medications that treat or prevent blood clots like warfarin Oxyphenbutazone Propranolol Steroid medications like prednisone or cortisone This list may not describe all possible interactions. Give your health care provider a list  of all the medicines, herbs, non-prescription drugs, or dietary supplements you use. Also tell them if you smoke, drink alcohol, or use illegal drugs. Some items may interact with your medicine. What should I watch for while using this medication? Visit your care team for regular checks on your progress. They will need to check the level of testosterone in your blood. Heart attacks and strokes have been reported with the use of this medication. Get emergency help if you develop signs or symptoms of a heart attack or stroke. Talk to your care team about the risks and benefits of this medication. This medication may affect blood sugar levels. If you have diabetes, check with your care team before you change your diet or the dose of your diabetic medication. Talk to your care team if you wish to become pregnant or think you might be pregnant. This medication can cause serious birth defects if taken during pregnancy. This medication is banned from use in athletes by most athletic organizations. What side effects may I notice from receiving this medication? Side effects that you should report to your care team as soon as possible: Allergic reactions--skin rash, itching, hives, swelling of the face, lips, tongue, or throat Blood clot--pain, swelling, or warmth in the leg, shortness of breath, chest pain Heart attack--pain or tightness in the chest, shoulders, arms or jaw, nausea, shortness of breath, cold or clammy skin, feeling faint or lightheaded Increase in blood pressure Liver injury--right upper belly pain, loss of appetite, nausea, light-colored stool, dark yellow or brown urine, yellowing of the skin or eyes, unusual weakness or fatigue Mood swings, irritability, or hostility Prolonged or painful erection Sleep apnea--loud snoring,  gasping during sleep, daytime sleepiness Stroke--sudden numbness or weakness of the face, arm or leg, trouble speaking, confusion, trouble walking, loss of balance or  coordination, dizziness, severe headache, change in vision Swelling of the ankles, hands, or feet Thoughts of suicide or self-harm, worsening mood, feelings of depression Side effects that usually do not require medical attention (report to your care team if they continue or are bothersome): Acne Change in sex drive or performance Pain, redness, or irritation at the application site Unexpected breast tissue growth This list may not describe all possible side effects. Call your doctor for medical advice about side effects. You may report side effects to FDA at 1-800-FDA-1088. Where should I keep my medication? Keep out of the reach of children. This medication can be abused. Keep your medication in a safe place to protect it from theft. Do not share this medication with anyone. Selling or giving away this medication is dangerous and against the law. Store at room temperature between 20 and 25 degrees C (68 and 77 degrees F). Do not freeze. Protect from light. Follow the directions for the product you are prescribed. Throw away any unused medication after the expiration date. NOTE: This sheet is a summary. It may not cover all possible information. If you have questions about this medicine, talk to your doctor, pharmacist, or health care provider.  2024 Elsevier/Gold Standard (2021-12-16 00:00:00)

## 2023-10-26 NOTE — Progress Notes (Signed)
 Patient complains of generalized pain that is chronic in nature. Patient rates the pain a 10 on the 0 to 10 pain scale. Dilaudid  administered per orders.  VSS. Patient discharged ambulatory without complaints or concerns.

## 2023-10-27 ENCOUNTER — Encounter: Admitting: Podiatry

## 2023-10-27 LAB — IGG, IGA, IGM
IgA: 333 mg/dL (ref 61–437)
IgG (Immunoglobin G), Serum: 2258 mg/dL — ABNORMAL HIGH (ref 603–1613)
IgM (Immunoglobulin M), Srm: 56 mg/dL (ref 15–143)

## 2023-10-27 LAB — KAPPA/LAMBDA LIGHT CHAINS
Kappa free light chain: 107.9 mg/L — ABNORMAL HIGH (ref 3.3–19.4)
Kappa, lambda light chain ratio: 1.61 (ref 0.26–1.65)
Lambda free light chains: 67 mg/L — ABNORMAL HIGH (ref 5.7–26.3)

## 2023-10-28 LAB — IRON AND IRON BINDING CAPACITY (CC-WL,HP ONLY)
Iron: 128 ug/dL (ref 45–182)
Saturation Ratios: 71 % — ABNORMAL HIGH (ref 17.9–39.5)
TIBC: 179 ug/dL — ABNORMAL LOW (ref 250–450)
UIBC: 51 ug/dL

## 2023-10-28 LAB — PROTEIN ELECTROPHORESIS, SERUM
A/G Ratio: 0.9 (ref 0.7–1.7)
Albumin ELP: 3.5 g/dL (ref 2.9–4.4)
Alpha-1-Globulin: 0.2 g/dL (ref 0.0–0.4)
Alpha-2-Globulin: 0.8 g/dL (ref 0.4–1.0)
Beta Globulin: 0.9 g/dL (ref 0.7–1.3)
Gamma Globulin: 2.1 g/dL — ABNORMAL HIGH (ref 0.4–1.8)
Globulin, Total: 4 g/dL — ABNORMAL HIGH (ref 2.2–3.9)
Total Protein ELP: 7.5 g/dL (ref 6.0–8.5)

## 2023-11-05 ENCOUNTER — Ambulatory Visit (INDEPENDENT_AMBULATORY_CARE_PROVIDER_SITE_OTHER): Admitting: Podiatry

## 2023-11-05 DIAGNOSIS — Z9889 Other specified postprocedural states: Secondary | ICD-10-CM

## 2023-11-05 DIAGNOSIS — M2041 Other hammer toe(s) (acquired), right foot: Secondary | ICD-10-CM

## 2023-11-05 NOTE — Progress Notes (Signed)
 Subjective:  Patient ID: [name], male    DOB: 1950-08-11,  MRN: [id number]  Chief Complaint  Patient presents with   Routine Post Op    Pt is here for 2nd post op visit suture removal     DOS: 10/04/2023 Procedure: Right fifth digit arthroplasty with syndactylization 4th and 5th with floating osteotomy of the fifth right foot  73 y.o. male returns for post-op check.  Patient states is doing well.  He has been walking a lot on his foot.  He states is doing okay.  He is here to get the rest of his sutures out denies any other acute issues.  Review of Systems: Negative except as noted in the HPI. Denies N/V/F/Ch.  Past Medical History:  Diagnosis Date   Anemia of renal disease 03/12/2011   Anemia, iron  deficiency 03/12/2011   Anxiety disorder    Aortic atherosclerosis (HCC)    Arthritis    Bilateral renal cysts    BPH (benign prostatic hyperplasia)    Chronic diastolic CHF (congestive heart failure) (HCC)    Chronic pain syndrome    a.) on COT   Chronic use of opiate for therapeutic purpose    CKD (chronic kidney disease), stage IV (HCC)    Diabetic neuropathy (HCC)    Diverticulosis    DM (diabetes mellitus), type 2 (HCC)    ED (erectile dysfunction)    a.) on PDE5i (sildenafil)   Esophageal dysphagia    Family history of adverse reaction to anesthesia    daughter has had seizures during anesthesia   Fibromyalgia    GERD (gastroesophageal reflux disease)    Hammertoe of right foot    Hemorrhoids    Herpes zoster    History of COVID-19 04/03/2019   Hyperlipidemia    Hypertension    Hypotestosteronism 03/17/2012   a.) on exogenous TRT injections (depotestosterone cypionate)   Multiple myeloma (HCC)    Port-A-Cath in place    PSVT (paroxysmal supraventricular tachycardia) (HCC)    PVCs (premature ventricular contractions)    Renal cyst, left    Status post bilateral cataract extraction    Type 2 diabetes mellitus (HCC)    Vitamin D  deficiency     Current  Outpatient Medications:    amLODipine  (NORVASC ) 5 MG tablet, Take 5 mg by mouth 2 (two) times daily., Disp: , Rfl:    doxycycline  (VIBRA -TABS) 100 MG tablet, Take 1 tablet (100 mg total) by mouth 2 (two) times daily., Disp: 60 tablet, Rfl: 0   furosemide  (LASIX ) 40 MG tablet, Take 1 tablet (40 mg total) by mouth daily., Disp: 30 tablet, Rfl: 0   gabapentin  (NEURONTIN ) 300 MG capsule, TAKE 1 CAPSULE BY MOUTH THREE TIMES A DAY, Disp: 270 capsule, Rfl: 1   ibuprofen  (ADVIL ) 800 MG tablet, Take 1 tablet (800 mg total) by mouth every 6 (six) hours as needed., Disp: 60 tablet, Rfl: 1   JANUVIA 100 MG tablet, Take 100 mg by mouth daily., Disp: , Rfl:    metoprolol  succinate (TOPROL -XL) 25 MG 24 hr tablet, Take 25 mg by mouth in the morning., Disp: , Rfl:    morphine  (MS CONTIN ) 30 MG 12 hr tablet, Take 30 mg by mouth 3 (three) times daily., Disp: , Rfl:    omeprazole  (PRILOSEC) 40 MG capsule, TAKE 1 CAPSULE BY MOUTH TWICE A DAY, Disp: 180 capsule, Rfl: 0   ondansetron  (ZOFRAN ) 4 MG tablet, Take 1 tablet (4 mg total) by mouth every 6 (six) hours as needed for nausea.,  Disp: 20 tablet, Rfl: 0   oxyCODONE -acetaminophen  (PERCOCET) 10-325 MG tablet, Take 1 tablet by mouth every 4 (four) hours., Disp: , Rfl:    oxyCODONE -acetaminophen  (PERCOCET) 5-325 MG tablet, Take 1 tablet by mouth every 4 (four) hours as needed for severe pain (pain score 7-10)., Disp: 30 tablet, Rfl: 0   OXYCONTIN  20 MG 12 hr tablet, Take 20 mg by mouth every 12 (twelve) hours., Disp: , Rfl:    sildenafil (VIAGRA) 100 MG tablet, Take 100 mg by mouth daily as needed for erectile dysfunction., Disp: , Rfl:    SUMAtriptan  (IMITREX ) 100 MG tablet, Take 100 mg by mouth daily as needed for migraine., Disp: , Rfl:    torsemide  (DEMADEX ) 20 MG tablet, Take 20 mg by mouth 2 (two) times daily., Disp: , Rfl:    Vitamin D , Ergocalciferol , (DRISDOL ) 50000 UNITS CAPS capsule, Take 50,000 Units by mouth every Saturday. , Disp: , Rfl:  No current  facility-administered medications for this visit.  Facility-Administered Medications Ordered in Other Visits:    0.9 %  sodium chloride  infusion, , Intravenous, Continuous, Franchot Lauraine HERO, NP, Stopped at 10/20/19 1037   furosemide  (LASIX ) injection 40 mg, 40 mg, Intravenous, Once, Ennever, Maude SAUNDERS, MD   HYDROmorphone  (DILAUDID ) 1 MG/ML injection, , , ,    morphine  4 MG/ML injection 4 mg, 4 mg, Intravenous, Q4H PRN, Ennever, Peter R, MD   sodium chloride  flush (NS) 0.9 % injection 10 mL, 10 mL, Intravenous, PRN, Franchot Lauraine HERO, NP, 10 mL at 10/20/19 1042  Social History   Tobacco Use  Smoking Status Never  Smokeless Tobacco Never    Allergies  Allergen Reactions   Deltasone [Prednisone] Itching, Dermatitis and Rash   Iodinated Contrast Media Rash    Patient states he was instructed not to take IV contrast.  In 2008 he had an unknown reaction, and was told not to take it again.  He was also told not to take it due to his kidneys.   Iodine Anxiety, Rash and Other (See Comments)    Didn't feel right instructed not to take per MD--something with his port    Ioversol Other (See Comments)    Unknown reaction   Objective:  There were no vitals filed for this visit. There is no height or weight on file to calculate BMI. Constitutional Well developed. Well nourished.  Vascular Foot warm and well perfused. Capillary refill normal to all digits.   Neurologic Normal speech. Oriented to person, place, and time. Epicritic sensation to light touch grossly present bilaterally.  Dermatologic Skin healing well without signs of infection. Skin edges well coapted without signs of infection.  No further dehiscence noted.  No deep probing of bone noted.  No signs of infection noted  Orthopedic: No further tenderness to palpation noted about the surgical site.   Radiographs: 3 views skeletally mature the right foot: Good correction alignment noted.  Reduction of hammertoe contracture noted.   Osteotomy of the fifth metatarsal noted. Assessment:   No diagnosis found.  Plan:  Patient was evaluated and treated and all questions answered.  S/p foot surgery right - Clinically healed incision has healed considerably.  At this time the remaining sutures were taken out.  He can return to regular shoes and can resume regular activities if any foot and ankle issues are future he will come back and see me.  No follow-ups on file.

## 2023-11-09 ENCOUNTER — Other Ambulatory Visit: Payer: Self-pay | Admitting: Podiatry

## 2023-12-08 ENCOUNTER — Inpatient Hospital Stay

## 2023-12-08 ENCOUNTER — Inpatient Hospital Stay (HOSPITAL_BASED_OUTPATIENT_CLINIC_OR_DEPARTMENT_OTHER): Admitting: Hematology & Oncology

## 2023-12-08 ENCOUNTER — Inpatient Hospital Stay: Attending: Hematology & Oncology

## 2023-12-08 ENCOUNTER — Encounter: Payer: Self-pay | Admitting: Hematology & Oncology

## 2023-12-08 VITALS — BP 138/57 | HR 75 | Temp 98.2°F | Resp 16 | Wt 171.0 lb

## 2023-12-08 VITALS — BP 151/73 | HR 78 | Temp 97.8°F | Resp 18

## 2023-12-08 DIAGNOSIS — C9000 Multiple myeloma not having achieved remission: Secondary | ICD-10-CM | POA: Insufficient documentation

## 2023-12-08 DIAGNOSIS — D539 Nutritional anemia, unspecified: Secondary | ICD-10-CM | POA: Insufficient documentation

## 2023-12-08 DIAGNOSIS — D631 Anemia in chronic kidney disease: Secondary | ICD-10-CM | POA: Diagnosis not present

## 2023-12-08 DIAGNOSIS — N184 Chronic kidney disease, stage 4 (severe): Secondary | ICD-10-CM | POA: Diagnosis not present

## 2023-12-08 DIAGNOSIS — C184 Malignant neoplasm of transverse colon: Secondary | ICD-10-CM | POA: Diagnosis not present

## 2023-12-08 DIAGNOSIS — E291 Testicular hypofunction: Secondary | ICD-10-CM | POA: Diagnosis not present

## 2023-12-08 DIAGNOSIS — Z7984 Long term (current) use of oral hypoglycemic drugs: Secondary | ICD-10-CM | POA: Insufficient documentation

## 2023-12-08 DIAGNOSIS — E1122 Type 2 diabetes mellitus with diabetic chronic kidney disease: Secondary | ICD-10-CM | POA: Diagnosis not present

## 2023-12-08 DIAGNOSIS — E349 Endocrine disorder, unspecified: Secondary | ICD-10-CM

## 2023-12-08 DIAGNOSIS — Z299 Encounter for prophylactic measures, unspecified: Secondary | ICD-10-CM

## 2023-12-08 DIAGNOSIS — D500 Iron deficiency anemia secondary to blood loss (chronic): Secondary | ICD-10-CM

## 2023-12-08 DIAGNOSIS — M4712 Other spondylosis with myelopathy, cervical region: Secondary | ICD-10-CM

## 2023-12-08 LAB — CBC WITH DIFFERENTIAL (CANCER CENTER ONLY)
Abs Immature Granulocytes: 0.05 K/uL (ref 0.00–0.07)
Basophils Absolute: 0.1 K/uL (ref 0.0–0.1)
Basophils Relative: 1 %
Eosinophils Absolute: 0.2 K/uL (ref 0.0–0.5)
Eosinophils Relative: 4 %
HCT: 27.4 % — ABNORMAL LOW (ref 39.0–52.0)
Hemoglobin: 8.4 g/dL — ABNORMAL LOW (ref 13.0–17.0)
Immature Granulocytes: 1 %
Lymphocytes Relative: 18 %
Lymphs Abs: 1.1 K/uL (ref 0.7–4.0)
MCH: 24.1 pg — ABNORMAL LOW (ref 26.0–34.0)
MCHC: 30.7 g/dL (ref 30.0–36.0)
MCV: 78.5 fL — ABNORMAL LOW (ref 80.0–100.0)
Monocytes Absolute: 0.8 K/uL (ref 0.1–1.0)
Monocytes Relative: 13 %
Neutro Abs: 3.8 K/uL (ref 1.7–7.7)
Neutrophils Relative %: 63 %
Platelet Count: 178 K/uL (ref 150–400)
RBC: 3.49 MIL/uL — ABNORMAL LOW (ref 4.22–5.81)
RDW: 18.6 % — ABNORMAL HIGH (ref 11.5–15.5)
WBC Count: 6 K/uL (ref 4.0–10.5)
nRBC: 0 % (ref 0.0–0.2)

## 2023-12-08 LAB — PREPARE RBC (CROSSMATCH)

## 2023-12-08 LAB — CMP (CANCER CENTER ONLY)
ALT: 33 U/L (ref 0–44)
AST: 42 U/L — ABNORMAL HIGH (ref 15–41)
Albumin: 4.1 g/dL (ref 3.5–5.0)
Alkaline Phosphatase: 97 U/L (ref 38–126)
Anion gap: 12 (ref 5–15)
BUN: 42 mg/dL — ABNORMAL HIGH (ref 8–23)
CO2: 24 mmol/L (ref 22–32)
Calcium: 9.1 mg/dL (ref 8.9–10.3)
Chloride: 104 mmol/L (ref 98–111)
Creatinine: 3.7 mg/dL — ABNORMAL HIGH (ref 0.61–1.24)
GFR, Estimated: 16 mL/min — ABNORMAL LOW (ref >60)
Glucose, Bld: 109 mg/dL — ABNORMAL HIGH (ref 70–99)
Potassium: 4.2 mmol/L (ref 3.5–5.1)
Sodium: 140 mmol/L (ref 135–145)
Total Bilirubin: 0.3 mg/dL (ref 0.0–1.2)
Total Protein: 7.6 g/dL (ref 6.5–8.1)

## 2023-12-08 LAB — RETICULOCYTES
Immature Retic Fract: 5.7 % (ref 2.3–15.9)
RBC.: 3.46 MIL/uL — ABNORMAL LOW (ref 4.22–5.81)
Retic Count, Absolute: 20.4 K/uL (ref 19.0–186.0)
Retic Ct Pct: 0.6 % (ref 0.4–3.1)

## 2023-12-08 LAB — IRON AND IRON BINDING CAPACITY (CC-WL,HP ONLY)
Iron: 55 ug/dL (ref 45–182)
Saturation Ratios: 28 % (ref 17.9–39.5)
TIBC: 197 ug/dL — ABNORMAL LOW (ref 250–450)
UIBC: 142 ug/dL

## 2023-12-08 LAB — LACTATE DEHYDROGENASE: LDH: 342 U/L — ABNORMAL HIGH (ref 98–192)

## 2023-12-08 LAB — SAMPLE TO BLOOD BANK

## 2023-12-08 LAB — FERRITIN: Ferritin: 1898 ng/mL — ABNORMAL HIGH (ref 24–336)

## 2023-12-08 MED ORDER — EPOETIN ALFA-EPBX 40000 UNIT/ML IJ SOLN
40000.0000 [IU] | Freq: Once | INTRAMUSCULAR | Status: AC
  Administered 2023-12-08: 40000 [IU] via SUBCUTANEOUS
  Filled 2023-12-08: qty 1

## 2023-12-08 MED ORDER — FOLIC ACID 1 MG PO TABS: 2.0000 mg | ORAL_TABLET | Freq: Every day | ORAL | 5 refills | Status: AC

## 2023-12-08 MED ORDER — SODIUM CHLORIDE 0.9 % IV SOLN: Freq: Once | INTRAVENOUS | Status: AC

## 2023-12-08 MED ORDER — ACETAMINOPHEN 325 MG PO TABS
650.0000 mg | ORAL_TABLET | Freq: Once | ORAL | Status: AC
  Administered 2023-12-08: 650 mg via ORAL
  Filled 2023-12-08: qty 2

## 2023-12-08 MED ORDER — DIPHENHYDRAMINE HCL 25 MG PO CAPS
25.0000 mg | ORAL_CAPSULE | Freq: Once | ORAL | Status: AC
  Administered 2023-12-08: 25 mg via ORAL
  Filled 2023-12-08: qty 1

## 2023-12-08 MED ORDER — CYANOCOBALAMIN 1000 MCG/ML IJ SOLN
1000.0000 ug | Freq: Once | INTRAMUSCULAR | Status: AC
  Administered 2023-12-08: 1000 ug via INTRAMUSCULAR
  Filled 2023-12-08: qty 1

## 2023-12-08 MED ORDER — HYDROMORPHONE HCL 1 MG/ML IJ SOLN
1.0000 mg | Freq: Once | INTRAMUSCULAR | Status: AC
  Administered 2023-12-08: 1 mg via INTRAVENOUS
  Filled 2023-12-08: qty 1

## 2023-12-08 MED ORDER — SODIUM CHLORIDE 0.9 % IV SOLN
60.0000 mg | Freq: Once | INTRAVENOUS | Status: AC
  Administered 2023-12-08: 60 mg via INTRAVENOUS
  Filled 2023-12-08: qty 10
  Filled 2023-12-08: qty 20

## 2023-12-08 MED ORDER — TESTOSTERONE CYPIONATE 200 MG/ML IM SOLN
400.0000 mg | Freq: Once | INTRAMUSCULAR | Status: AC
  Administered 2023-12-08: 400 mg via INTRAMUSCULAR
  Filled 2023-12-08: qty 2

## 2023-12-08 MED ORDER — HYDROMORPHONE HCL 1 MG/ML IJ SOLN
2.0000 mg | Freq: Once | INTRAMUSCULAR | Status: AC
  Administered 2023-12-08: 2 mg via INTRAVENOUS
  Filled 2023-12-08: qty 2

## 2023-12-08 NOTE — Patient Instructions (Signed)
 Blood Transfusion, Adult, Care After After a blood transfusion, it is common to have: Bruising and soreness at the IV site. A headache. Follow these instructions at home: Your doctor may give you more instructions. If you have problems, contact your doctor. Insertion site care     Follow instructions from your doctor about how to take care of your insertion site. This is where an IV tube was put into your vein. Make sure you: Wash your hands with soap and water for at least 20 seconds before and after you change your bandage. If you cannot use soap and water, use hand sanitizer. Change your bandage as told by your doctor. Check your insertion site every day for signs of infection. Check for: Redness, swelling, or pain. Bleeding from the site. Warmth. Pus or a bad smell. General instructions Take over-the-counter and prescription medicines only as told by your doctor. Rest as told by your doctor. Go back to your normal activities as told by your doctor. Keep all follow-up visits. You may need to have tests at certain times to check your blood. Contact a doctor if: You have itching or red, swollen areas of skin (hives). You have a fever or chills. You have pain in the head, back, or chest. You feel worried or nervous (anxious). You feel weak after doing your normal activities. You have any of these problems at the insertion site: Redness, swelling, warmth, or pain. Bleeding that does not stop with pressure. Pus or a bad smell. If you received your blood transfusion in an outpatient setting, you will be told whom to contact to report any reactions. Get help right away if: You have signs of a serious reaction. This may be coming from an allergy or the body's defense system (immune system). Signs include: Trouble breathing or shortness of breath. Swelling of the face or feeling warm (flushed). A widespread rash. Dark pee (urine) or blood in the pee. Fast heartbeat. These symptoms  may be an emergency. Get help right away. Call 911. Do not wait to see if the symptoms will go away. Do not drive yourself to the hospital. Summary Bruising and soreness at the IV site are common. Check your insertion site every day for signs of infection. Rest as told by your doctor. Go back to your normal activities as told by your doctor. Get help right away if you have signs of a serious reaction. This information is not intended to replace advice given to you by your health care provider. Make sure you discuss any questions you have with your health care provider. Document Revised: 05/09/2021 Document Reviewed: 05/09/2021 Elsevier Patient Education  2024 [employer]Pamidronate  Injection What is this medication? PAMIDRONATE  (pa mi DROE nate) treats high calcium  levels in the blood caused by cancer. It may also be used with chemotherapy to treat weakened bones caused by cancer. It can also be used to treat Paget's disease of the bone. It works by slowing down the release of calcium  from bones. This lowers calcium  levels in your blood. It also makes your bones stronger and less likely to break (fracture). It belongs to a group of medications called bisphosphonates. This medicine may be used for other purposes; ask your health care provider or pharmacist if you have questions. COMMON BRAND NAME(S): Aredia  What should I tell my care team before I take this medication? They need to know if you have any of these conditions: Bleeding disorder Cancer Dental disease Kidney disease Low levels of calcium  or other minerals  in the blood Low red blood cell counts Receiving steroids, such as dexamethasone  or prednisone An unusual or allergic reaction to pamidronate , other medications, foods, dyes or preservatives Pregnant or trying to get pregnant Breast-feeding How should I use this medication? This medication is injected into a vein. It is given by your care team in a hospital or clinic  setting. Talk to your care team about the use of this medication in children. Special care may be needed. Overdosage: If you think you have taken too much of this medicine contact a poison control center or emergency room at once. NOTE: This medicine is only for you. Do not share this medicine with others. What if I miss a dose? Keep appointments for follow-up doses. It is important not to miss your dose. Call your care team if you are unable to keep an appointment. What may interact with this medication? Certain antibiotics given by injection Medications for inflammation or pain, such as ibuprofen , naproxen Some diuretics, such as bumetanide, furosemide  Cyclosporine  Parathyroid hormone Tacrolimus Teriparatide Thalidomide This list may not describe all possible interactions. Give your health care provider a list of all the medicines, herbs, non-prescription drugs, or dietary supplements you use. Also tell them if you smoke, drink alcohol , or use illegal drugs. Some items may interact with your medicine. What should I watch for while using this medication? Visit your care team for regular checks on your progress. It may be some time before you see the benefit from this medication. Some people who take this medication have severe bone, joint, or muscle pain. This medication may also increase your risk for jaw problems or a broken thigh bone. Tell your care team right away if you have severe pain in your jaw, bones, joints, or muscles. Tell your care team if you have any pain that does not go away or that gets worse. Tell your dentist and dental surgeon that you are taking this medication. You should not have major dental surgery while on this medication. See your dentist to have a dental exam and fix any dental problems before starting this medication. Take good care of your teeth while on this medication. Make sure you see your dentist for regular follow-up appointments. You should make sure you  get enough calcium  and vitamin D  while you are taking this medication. Discuss the foods you eat and the vitamins you take with your care team. You may need bloodwork while you are taking this medication. Talk to your care team if you wish to become pregnant or think you might be pregnant. This medication can cause serious birth defects. What side effects may I notice from receiving this medication? Side effects that you should report to your care team as soon as possible: Allergic reactions--skin rash, itching, hives, swelling of the face, lips, tongue, or throat Kidney injury--decrease in the amount of urine, swelling of the ankles, hands, or feet Low calcium  level--muscle pain or cramps, confusion, tingling, or numbness in the hands or feet Osteonecrosis of the jaw--pain, swelling, or redness in the mouth, numbness of the jaw, poor healing after dental work, unusual discharge from the mouth, visible bones in the mouth Severe bone, joint, or muscle pain Side effects that usually do not require medical attention (report to your care team if they continue or are bothersome): Constipation Fatigue Fever Loss of appetite Nausea Pain, redness, or irritation at injection site Stomach pain This list may not describe all possible side effects. Call your doctor for medical advice about  side effects. You may report side effects to FDA at 1-800-FDA-1088. Where should I keep my medication? This medication is given in a hospital or clinic. It will not be stored at home. NOTE: This sheet is a summary. It may not cover all possible information. If you have questions about this medicine, talk to your doctor, pharmacist, or health care provider.  2024 Elsevier/Gold Standard (2021-03-31 00:00:00)

## 2023-12-08 NOTE — Progress Notes (Signed)
 Hematology and Oncology Follow Up Visit  [name] [id number] 1950-12-14 73 y.o. 12/08/2023   Principle Diagnosis:  IgG kappa myeloma Anemia secondary to renal insufficiency Intermittent iron  - deficiency anemia Hypotestosteronemia   Prior Therapy:        Kyprolis /Cytoxan  - s/p cycle #40 --DC on 12/24/2022   Current Therapy:  Aredia  60 mg IV q  month -- Started on 07/2022 Retacrit  SQ as needed for hemoglobin less than 10 DepoTestosterone 400 mg q 4 weeks B12 IM monthly IV iron  as indicated        Interim History:  [name] is here today for follow-up.  He is feeling little bit tired today.  His hemoglobin is down to 8.4.  We I think going to have to transfuse him.  Again, I am sure this is all from his renal insufficiency.  When we last saw him, his medical spike was not observed.  His IgG level was 2258 mg/dL.  His kappa light chain was 10.8 mg/dL.  These were both a little bit elevated.  His last iron  studies that were done back in September showed a ferritin of 1000 with iron  saturation of 71%.  He did have a low folate level.  I will have to make sure that he takes folic acid.  Again, his arthritis is a problem for him.  He also has diabetes.  We will have to see what his blood sugars are.  He seems to get around pretty well.  He has surgery for his right foot about a month or so ago.  His last testosterone  level back in July was 400 ng/dL.  Currently, I would have to say that his performance status is probably ECOG 2.   Medications:  Allergies as of 12/08/2023       Reactions   Deltasone [prednisone] Itching, Dermatitis, Rash   Iodinated Contrast Media Rash   Patient states he was instructed not to take IV contrast.  In 2008 he had an unknown reaction, and was told not to take it again.  He was also told not to take it due to his kidneys.   Iodine Anxiety, Rash, Other (See Comments)   Didn't feel right instructed not to take per MD--something with his  port   Ioversol Other (See Comments)   Unknown reaction        Medication List        Accurate as of December 08, 2023  8:16 AM. If you have any questions, ask your nurse or doctor.          amLODipine  5 MG tablet Commonly known as: NORVASC  Take 5 mg by mouth 2 (two) times daily.   doxycycline  100 MG tablet Commonly known as: VIBRA -TABS Take 1 tablet (100 mg total) by mouth 2 (two) times daily.   furosemide  40 MG tablet Commonly known as: LASIX  Take 1 tablet (40 mg total) by mouth daily.   gabapentin  300 MG capsule Commonly known as: NEURONTIN  TAKE 1 CAPSULE BY MOUTH THREE TIMES A DAY   ibuprofen  800 MG tablet Commonly known as: ADVIL  Take 1 tablet (800 mg total) by mouth every 6 (six) hours as needed.   Januvia 100 MG tablet Generic drug: sitaGLIPtin Take 100 mg by mouth daily.   metoprolol  succinate 25 MG 24 hr tablet Commonly known as: TOPROL -XL Take 25 mg by mouth in the morning.   morphine  30 MG 12 hr tablet Commonly known as: MS CONTIN  Take 30 mg by mouth 3 (three) times daily.  omeprazole  40 MG capsule Commonly known as: PRILOSEC TAKE 1 CAPSULE BY MOUTH TWICE A DAY   ondansetron  4 MG tablet Commonly known as: ZOFRAN  Take 1 tablet (4 mg total) by mouth every 6 (six) hours as needed for nausea.   oxyCODONE -acetaminophen  10-325 MG tablet Commonly known as: PERCOCET Take 1 tablet by mouth every 4 (four) hours.   oxyCODONE -acetaminophen  5-325 MG tablet Commonly known as: Percocet Take 1 tablet by mouth every 4 (four) hours as needed for severe pain (pain score 7-10).   OxyCONTIN  20 MG 12 hr tablet Generic drug: oxyCODONE  Take 20 mg by mouth every 12 (twelve) hours.   sildenafil 100 MG tablet Commonly known as: VIAGRA Take 100 mg by mouth daily as needed for erectile dysfunction.   SUMAtriptan  100 MG tablet Commonly known as: IMITREX  Take 100 mg by mouth daily as needed for migraine.   torsemide  20 MG tablet Commonly known as:  DEMADEX  Take 20 mg by mouth 2 (two) times daily.   Vitamin D  (Ergocalciferol ) 1.25 MG (50000 UNIT) Caps capsule Commonly known as: DRISDOL  Take 50,000 Units by mouth every Saturday.        Allergies:  Allergies  Allergen Reactions   Deltasone [Prednisone] Itching, Dermatitis and Rash   Iodinated Contrast Media Rash    Patient states he was instructed not to take IV contrast.  In 2008 he had an unknown reaction, and was told not to take it again.  He was also told not to take it due to his kidneys.   Iodine Anxiety, Rash and Other (See Comments)    Didn't feel right instructed not to take per MD--something with his port    Ioversol Other (See Comments)    Unknown reaction    Past Medical History, Surgical history, Social history, and Family History were reviewed and updated.  Review of Systems:  Review of Systems  Constitutional:  Positive for malaise/fatigue.  HENT: Negative.    Eyes: Negative.   Respiratory: Negative.    Cardiovascular: Negative.   Gastrointestinal: Negative.   Genitourinary: Negative.   Musculoskeletal:  Positive for joint pain.  Skin: Negative.   Neurological: Negative.   Endo/Heme/Allergies: Negative.   Psychiatric/Behavioral: Negative.       Physical Exam:  Vital signs show temperature of 98.2.  Pulse 75.  Blood pressure 138/57.  Weight is 171 pounds.    Wt Readings from Last 3 Encounters:  12/08/23 171 lb (77.6 kg)  10/26/23 159 lb 6.4 oz (72.3 kg)  10/04/23 165 lb (74.8 kg)   Physical Exam Vitals reviewed.  HENT:     Head: Normocephalic and atraumatic.  Eyes:     Pupils: Pupils are equal, round, and reactive to light.  Cardiovascular:     Rate and Rhythm: Normal rate and regular rhythm.     Heart sounds: Normal heart sounds.  Pulmonary:     Effort: Pulmonary effort is normal.     Breath sounds: Normal breath sounds.  Abdominal:     General: Bowel sounds are normal.     Palpations: Abdomen is soft.  Musculoskeletal:         General: No tenderness or deformity. Normal range of motion.     Cervical back: Normal range of motion.     Comments: He has a walking sandal on the right foot.  Lymphadenopathy:     Cervical: No cervical adenopathy.  Skin:    General: Skin is warm and dry.     Findings: No erythema or rash.  Neurological:  Mental Status: He is alert and oriented to person, place, and time.  Psychiatric:        Behavior: Behavior normal.        Thought Content: Thought content normal.        Judgment: Judgment normal.      Lab Results  Component Value Date   WBC 7.0 10/26/2023   HGB 9.3 (L) 10/26/2023   HCT 30.6 (L) 10/26/2023   MCV 73.7 (L) 10/26/2023   PLT 210 10/26/2023   Lab Results  Component Value Date   FERRITIN 1,042 (H) 10/26/2023   IRON  128 10/26/2023   TIBC 179 (L) 10/26/2023   UIBC 51 10/26/2023   IRONPCTSAT 71 (H) 10/26/2023   Lab Results  Component Value Date   RETICCTPCT 0.6 12/08/2023   RBC 3.46 (L) 12/08/2023   RETICCTABS 27.5 03/21/2014   Lab Results  Component Value Date   KPAFRELGTCHN 107.9 (H) 10/26/2023   LAMBDASER 67.0 (H) 10/26/2023   KAPLAMBRATIO 1.61 10/26/2023   Lab Results  Component Value Date   IGGSERUM 2,258 (H) 10/26/2023   IGA 333 10/26/2023   IGMSERUM 56 10/26/2023   Lab Results  Component Value Date   TOTALPROTELP 7.5 10/26/2023   ALBUMINELP 3.5 10/26/2023   A1GS 0.2 10/26/2023   A2GS 0.8 10/26/2023   BETS 0.9 10/26/2023   BETA2SER 0.5 11/07/2014   GAMS 2.1 (H) 10/26/2023   MSPIKE Not Observed 10/26/2023   SPEI Comment 10/26/2023     Chemistry      Component Value Date/Time   NA 140 10/26/2023 0757   NA 143 02/22/2017 0744   K 3.6 10/26/2023 0757   K 4.0 02/22/2017 0744   CL 103 10/26/2023 0757   CL 106 02/22/2017 0744   CO2 22 10/26/2023 0757   CO2 27 02/22/2017 0744   BUN 57 (H) 10/26/2023 0757   BUN 26 (H) 02/22/2017 0744   CREATININE 3.41 (H) 10/26/2023 0757   CREATININE 2.3 (H) 02/22/2017 0744      Component  Value Date/Time   CALCIUM  9.1 10/26/2023 0757   CALCIUM  9.3 02/22/2017 0744   ALKPHOS 112 10/26/2023 0757   ALKPHOS 80 02/22/2017 0744   AST 35 10/26/2023 0757   ALT 31 10/26/2023 0757   ALT 22 02/22/2017 0744   BILITOT 0.3 10/26/2023 0757       Impression and Plan: Mr. Kniss is a very pleasant 73 yo African American gentleman with IgG kappa myeloma.  We are holding his treatment.  He has been off treatment now for quite a while.  I am so happy that his myeloma levels really have not been a problem.  He does other issues.  He has diabetes.  He has I think systemic arthritic issues.  He did have the surgery for his right foot.  He will get his testosterone  today.  This does seem to help his quality of life.  Of note, his vitamin B12 level today was 889 pcg/mL.  He will get a blood transfusion today.  I will give him 1 unit of blood.  I also think he is going to his Aredia .  This helps his arthritis.  He will also need testosterone .  He will get his vitamin B-12.    I will send in the folic acid for him.  We will have to get him back in 1 month.    Maude JONELLE Crease, MD 10/15/20258:16 AM

## 2023-12-08 NOTE — Patient Instructions (Signed)

## 2023-12-09 LAB — TYPE AND SCREEN
ABO/RH(D): O POS
Antibody Screen: NEGATIVE
Unit division: 0

## 2023-12-09 LAB — KAPPA/LAMBDA LIGHT CHAINS
Kappa free light chain: 93.3 mg/L — ABNORMAL HIGH (ref 3.3–19.4)
Kappa, lambda light chain ratio: 1.53 (ref 0.26–1.65)
Lambda free light chains: 61.1 mg/L — ABNORMAL HIGH (ref 5.7–26.3)

## 2023-12-09 LAB — BPAM RBC
Blood Product Expiration Date: 202511102359
ISSUE DATE / TIME: 202510151015
Unit Type and Rh: 5100

## 2023-12-09 LAB — IGG, IGA, IGM
IgA: 274 mg/dL (ref 61–437)
IgG (Immunoglobin G), Serum: 1759 mg/dL — ABNORMAL HIGH (ref 603–1613)
IgM (Immunoglobulin M), Srm: 61 mg/dL (ref 15–143)

## 2023-12-31 ENCOUNTER — Ambulatory Visit: Admitting: Podiatry

## 2023-12-31 ENCOUNTER — Encounter: Payer: Self-pay | Admitting: Podiatry

## 2023-12-31 DIAGNOSIS — B351 Tinea unguium: Secondary | ICD-10-CM | POA: Diagnosis not present

## 2023-12-31 DIAGNOSIS — E1151 Type 2 diabetes mellitus with diabetic peripheral angiopathy without gangrene: Secondary | ICD-10-CM

## 2023-12-31 DIAGNOSIS — M79675 Pain in left toe(s): Secondary | ICD-10-CM

## 2023-12-31 DIAGNOSIS — M79674 Pain in right toe(s): Secondary | ICD-10-CM | POA: Diagnosis not present

## 2023-12-31 NOTE — Progress Notes (Signed)
 This patient returns to my office for at risk foot care.  This patient requires this care by a professional since this patient will be at risk due to having diabetes type 2.  This patient is unable to cut nails himself since the patient cannot reach his nails.These nails are painful walking and wearing shoes.   This patient presents for at risk foot care today.  General Appearance  Alert, conversant and in no acute stress.  Vascular  Dorsalis pedis and posterior tibial  pulses are weakly  palpable  bilaterally.  Capillary return is within normal limits  bilaterally. Cold feet bilaterally.  Absent digital hair.  Neurologic  Senn-Weinstein monofilament wire test within normal limits/diminished right.  LOPS absent left foot. Muscle power within normal limits bilaterally.  Nails Thick disfigured discolored nails with subungual debris  from hallux to fifth toes bilaterally. No evidence of bacterial infection or drainage bilaterally.  Orthopedic  No limitations of motion  feet .  No crepitus or effusions noted.  No bony pathology or digital deformities noted.  HAV  B/L.  Exostosis right foot.Plantar flexed fifth metatarsall B/l.  Skin  normotropic skin with no porokeratosis noted bilaterally.  No signs of infections or ulcers noted.  Callus sub 5th met  B/l asymptomatic.  Syndactaly 4/5 right foot.  Onychomycosis  Pain in right toes  Pain in left toes    Consent was obtained for treatment procedures.   Mechanical debridement of nails 1-5  bilaterally performed with a nail nipper.  Filed with dremel without incident.    Return office visit    12 weeks                  Told patient to return for periodic foot care and evaluation due to potential at risk complications.   Cordella Bold DPM

## 2024-01-05 ENCOUNTER — Ambulatory Visit: Admitting: Podiatry

## 2024-01-11 ENCOUNTER — Inpatient Hospital Stay: Admitting: Hematology & Oncology

## 2024-01-11 ENCOUNTER — Inpatient Hospital Stay

## 2024-01-11 ENCOUNTER — Other Ambulatory Visit: Payer: Self-pay

## 2024-01-11 ENCOUNTER — Encounter: Payer: Self-pay | Admitting: Hematology & Oncology

## 2024-01-11 ENCOUNTER — Inpatient Hospital Stay: Attending: Hematology & Oncology

## 2024-01-11 VITALS — BP 158/61 | HR 72 | Temp 97.5°F | Resp 18 | Ht 71.0 in | Wt 169.0 lb

## 2024-01-11 DIAGNOSIS — N189 Chronic kidney disease, unspecified: Secondary | ICD-10-CM | POA: Diagnosis not present

## 2024-01-11 DIAGNOSIS — E1151 Type 2 diabetes mellitus with diabetic peripheral angiopathy without gangrene: Secondary | ICD-10-CM

## 2024-01-11 DIAGNOSIS — E291 Testicular hypofunction: Secondary | ICD-10-CM | POA: Insufficient documentation

## 2024-01-11 DIAGNOSIS — D631 Anemia in chronic kidney disease: Secondary | ICD-10-CM | POA: Insufficient documentation

## 2024-01-11 DIAGNOSIS — M1990 Unspecified osteoarthritis, unspecified site: Secondary | ICD-10-CM | POA: Diagnosis not present

## 2024-01-11 DIAGNOSIS — C9000 Multiple myeloma not having achieved remission: Secondary | ICD-10-CM | POA: Diagnosis present

## 2024-01-11 DIAGNOSIS — N184 Chronic kidney disease, stage 4 (severe): Secondary | ICD-10-CM | POA: Diagnosis not present

## 2024-01-11 DIAGNOSIS — Z299 Encounter for prophylactic measures, unspecified: Secondary | ICD-10-CM

## 2024-01-11 DIAGNOSIS — D500 Iron deficiency anemia secondary to blood loss (chronic): Secondary | ICD-10-CM | POA: Diagnosis not present

## 2024-01-11 DIAGNOSIS — E349 Endocrine disorder, unspecified: Secondary | ICD-10-CM | POA: Diagnosis not present

## 2024-01-11 DIAGNOSIS — E119 Type 2 diabetes mellitus without complications: Secondary | ICD-10-CM | POA: Insufficient documentation

## 2024-01-11 DIAGNOSIS — M4712 Other spondylosis with myelopathy, cervical region: Secondary | ICD-10-CM

## 2024-01-11 LAB — CBC WITH DIFFERENTIAL (CANCER CENTER ONLY)
Abs Immature Granulocytes: 0.04 K/uL (ref 0.00–0.07)
Basophils Absolute: 0.1 K/uL (ref 0.0–0.1)
Basophils Relative: 1 %
Eosinophils Absolute: 0.3 K/uL (ref 0.0–0.5)
Eosinophils Relative: 4 %
HCT: 29.9 % — ABNORMAL LOW (ref 39.0–52.0)
Hemoglobin: 9.4 g/dL — ABNORMAL LOW (ref 13.0–17.0)
Immature Granulocytes: 1 %
Lymphocytes Relative: 17 %
Lymphs Abs: 1.2 K/uL (ref 0.7–4.0)
MCH: 24.7 pg — ABNORMAL LOW (ref 26.0–34.0)
MCHC: 31.4 g/dL (ref 30.0–36.0)
MCV: 78.5 fL — ABNORMAL LOW (ref 80.0–100.0)
Monocytes Absolute: 0.7 K/uL (ref 0.1–1.0)
Monocytes Relative: 10 %
Neutro Abs: 4.7 K/uL (ref 1.7–7.7)
Neutrophils Relative %: 67 %
Platelet Count: 182 K/uL (ref 150–400)
RBC: 3.81 MIL/uL — ABNORMAL LOW (ref 4.22–5.81)
RDW: 16.8 % — ABNORMAL HIGH (ref 11.5–15.5)
WBC Count: 6.9 K/uL (ref 4.0–10.5)
nRBC: 0 % (ref 0.0–0.2)

## 2024-01-11 LAB — FERRITIN: Ferritin: 1784 ng/mL — ABNORMAL HIGH (ref 24–336)

## 2024-01-11 LAB — IRON AND IRON BINDING CAPACITY (CC-WL,HP ONLY)
Iron: 120 ug/dL (ref 45–182)
Saturation Ratios: 63 % — ABNORMAL HIGH (ref 17.9–39.5)
TIBC: 192 ug/dL — ABNORMAL LOW (ref 250–450)
UIBC: 72 ug/dL

## 2024-01-11 LAB — CMP (CANCER CENTER ONLY)
ALT: 27 U/L (ref 0–44)
AST: 31 U/L (ref 15–41)
Albumin: 4.2 g/dL (ref 3.5–5.0)
Alkaline Phosphatase: 121 U/L (ref 38–126)
Anion gap: 12 (ref 5–15)
BUN: 42 mg/dL — ABNORMAL HIGH (ref 8–23)
CO2: 24 mmol/L (ref 22–32)
Calcium: 8.5 mg/dL — ABNORMAL LOW (ref 8.9–10.3)
Chloride: 102 mmol/L (ref 98–111)
Creatinine: 3.02 mg/dL — ABNORMAL HIGH (ref 0.61–1.24)
GFR, Estimated: 21 mL/min — ABNORMAL LOW (ref >60)
Glucose, Bld: 95 mg/dL (ref 70–99)
Potassium: 5 mmol/L (ref 3.5–5.1)
Sodium: 138 mmol/L (ref 135–145)
Total Bilirubin: 0.3 mg/dL (ref 0.0–1.2)
Total Protein: 7.9 g/dL (ref 6.5–8.1)

## 2024-01-11 LAB — SAMPLE TO BLOOD BANK

## 2024-01-11 MED ORDER — CYANOCOBALAMIN 1000 MCG/ML IJ SOLN
1000.0000 ug | Freq: Once | INTRAMUSCULAR | Status: AC
  Administered 2024-01-11: 1000 ug via INTRAMUSCULAR
  Filled 2024-01-11: qty 1

## 2024-01-11 MED ORDER — SODIUM CHLORIDE 0.9 % IV SOLN: INTRAVENOUS | Status: DC

## 2024-01-11 MED ORDER — TESTOSTERONE CYPIONATE 200 MG/ML IM SOLN
400.0000 mg | Freq: Once | INTRAMUSCULAR | Status: AC
  Administered 2024-01-11: 400 mg via INTRAMUSCULAR
  Filled 2024-01-11: qty 2

## 2024-01-11 MED ORDER — SODIUM CHLORIDE 0.9 % IV SOLN
60.0000 mg | Freq: Once | INTRAVENOUS | Status: AC
  Administered 2024-01-11: 60 mg via INTRAVENOUS
  Filled 2024-01-11: qty 10
  Filled 2024-01-11: qty 20

## 2024-01-11 MED ORDER — HYDROMORPHONE HCL 1 MG/ML IJ SOLN
2.0000 mg | Freq: Once | INTRAMUSCULAR | Status: AC
  Administered 2024-01-11: 2 mg via INTRAVENOUS
  Filled 2024-01-11: qty 2

## 2024-01-11 MED ORDER — EPOETIN ALFA-EPBX 40000 UNIT/ML IJ SOLN
40000.0000 [IU] | Freq: Once | INTRAMUSCULAR | Status: AC
  Administered 2024-01-11: 40000 [IU] via SUBCUTANEOUS
  Filled 2024-01-11: qty 1

## 2024-01-11 NOTE — Progress Notes (Signed)
 Hematology and Oncology Follow Up Visit  [name] [id number] 30-Apr-1950 73 y.o. 01/11/2024   Principle Diagnosis:  IgG kappa myeloma Anemia secondary to renal insufficiency Intermittent iron  - deficiency anemia Hypotestosteronemia   Prior Therapy:        Kyprolis /Cytoxan  - s/p cycle #40 --DC on 12/24/2022   Current Therapy:  Aredia  60 mg IV q  month -- Started on 07/2022 Retacrit  SQ as needed for hemoglobin less than 10 DepoTestosterone 400 mg q 4 weeks B12 IM monthly IV iron  as indicated        Interim History:  [name] is here today for follow-up.  He is feeling little bit tired today.  He is still dealing with pain.  His bad arthritis.  I know that he sees rheumatology for all this.  He has done well off treatment for the myeloma.  When we last saw him, there was no monoclonal spike in his blood.  His IgG level was 1760 mg/dL.  The Kappa light chain was 9.3 mg/dL.  He does have the issue with renal insufficiency.  He is on Retacrit .  He does get Aredia  which does seem to help his bones.  His blood sugars typically have been on the high side.  He is trying to help control this.  He does have low testosterone .  He does get testosterone  injections.  His last testosterone  level back in July was 400.  Overall, I would have to say that his performance status is probably ECOG 2.    Medications:  Allergies as of 01/11/2024       Reactions   Deltasone [prednisone] Itching, Dermatitis, Rash   Iodinated Contrast Media Rash   Patient states he was instructed not to take IV contrast.  In 2008 he had an unknown reaction, and was told not to take it again.  He was also told not to take it due to his kidneys.   Iodine Anxiety, Rash, Other (See Comments)   Didn't feel right instructed not to take per MD--something with his port   Ioversol Other (See Comments)   Unknown reaction        Medication List        Accurate as of January 11, 2024 11:24 AM. If you have  any questions, ask your nurse or doctor.          amLODipine  5 MG tablet Commonly known as: NORVASC  Take 5 mg by mouth 2 (two) times daily.   dicyclomine  10 MG capsule Commonly known as: BENTYL  Take 10 mg by mouth 3 (three) times daily.   doxycycline  100 MG tablet Commonly known as: VIBRA -TABS Take 1 tablet (100 mg total) by mouth 2 (two) times daily.   folic acid 1 MG tablet Commonly known as: FOLVITE Take 2 tablets (2 mg total) by mouth daily.   furosemide  40 MG tablet Commonly known as: LASIX  Take 1 tablet (40 mg total) by mouth daily.   gabapentin  300 MG capsule Commonly known as: NEURONTIN  TAKE 1 CAPSULE BY MOUTH THREE TIMES A DAY   ibuprofen  800 MG tablet Commonly known as: ADVIL  Take 1 tablet (800 mg total) by mouth every 6 (six) hours as needed.   Januvia 100 MG tablet Generic drug: sitaGLIPtin Take 100 mg by mouth daily.   metoprolol  succinate 25 MG 24 hr tablet Commonly known as: TOPROL -XL Take 25 mg by mouth in the morning.   morphine  30 MG 12 hr tablet Commonly known as: MS CONTIN  Take 30 mg by mouth 3 (three) times  daily.   omeprazole  40 MG capsule Commonly known as: PRILOSEC TAKE 1 CAPSULE BY MOUTH TWICE A DAY   ondansetron  4 MG tablet Commonly known as: ZOFRAN  Take 1 tablet (4 mg total) by mouth every 6 (six) hours as needed for nausea.   oxyCODONE -acetaminophen  10-325 MG tablet Commonly known as: PERCOCET Take 1 tablet by mouth every 4 (four) hours.   oxyCODONE -acetaminophen  5-325 MG tablet Commonly known as: Percocet Take 1 tablet by mouth every 4 (four) hours as needed for severe pain (pain score 7-10).   OxyCONTIN  20 MG 12 hr tablet Generic drug: oxyCODONE  Take 20 mg by mouth every 12 (twelve) hours.   sildenafil 100 MG tablet Commonly known as: VIAGRA Take 100 mg by mouth daily as needed for erectile dysfunction.   SUMAtriptan  100 MG tablet Commonly known as: IMITREX  Take 100 mg by mouth daily as needed for migraine.    torsemide  20 MG tablet Commonly known as: DEMADEX  Take 20 mg by mouth 2 (two) times daily.   Vitamin D  (Ergocalciferol ) 1.25 MG (50000 UNIT) Caps capsule Commonly known as: DRISDOL  Take 50,000 Units by mouth every Saturday.        Allergies:  Allergies  Allergen Reactions   Deltasone [Prednisone] Itching, Dermatitis and Rash   Iodinated Contrast Media Rash    Patient states he was instructed not to take IV contrast.  In 2008 he had an unknown reaction, and was told not to take it again.  He was also told not to take it due to his kidneys.   Iodine Anxiety, Rash and Other (See Comments)    Didn't feel right instructed not to take per MD--something with his port    Ioversol Other (See Comments)    Unknown reaction    Past Medical History, Surgical history, Social history, and Family History were reviewed and updated.  Review of Systems:  Review of Systems  Constitutional:  Positive for malaise/fatigue.  HENT: Negative.    Eyes: Negative.   Respiratory: Negative.    Cardiovascular: Negative.   Gastrointestinal: Negative.   Genitourinary: Negative.   Musculoskeletal:  Positive for joint pain.  Skin: Negative.   Neurological: Negative.   Endo/Heme/Allergies: Negative.   Psychiatric/Behavioral: Negative.       Physical Exam:  Vital signs show temperature of 97.5.  Pulse 72.  Blood pressure 158/61.  Weight is 169 pounds.    Wt Readings from Last 3 Encounters:  01/11/24 169 lb (76.7 kg)  12/08/23 171 lb (77.6 kg)  10/26/23 159 lb 6.4 oz (72.3 kg)   Physical Exam Vitals reviewed.  HENT:     Head: Normocephalic and atraumatic.  Eyes:     Pupils: Pupils are equal, round, and reactive to light.  Cardiovascular:     Rate and Rhythm: Normal rate and regular rhythm.     Heart sounds: Normal heart sounds.  Pulmonary:     Effort: Pulmonary effort is normal.     Breath sounds: Normal breath sounds.  Abdominal:     General: Bowel sounds are normal.      Palpations: Abdomen is soft.  Musculoskeletal:        General: No tenderness or deformity. Normal range of motion.     Cervical back: Normal range of motion.     Comments: He has a walking sandal on the right foot.  Lymphadenopathy:     Cervical: No cervical adenopathy.  Skin:    General: Skin is warm and dry.     Findings: No erythema or rash.  Neurological:     Mental Status: He is alert and oriented to person, place, and time.  Psychiatric:        Behavior: Behavior normal.        Thought Content: Thought content normal.        Judgment: Judgment normal.      Lab Results  Component Value Date   WBC 6.9 01/11/2024   HGB 9.4 (L) 01/11/2024   HCT 29.9 (L) 01/11/2024   MCV 78.5 (L) 01/11/2024   PLT 182 01/11/2024   Lab Results  Component Value Date   FERRITIN 1,898 (H) 12/08/2023   IRON  55 12/08/2023   TIBC 197 (L) 12/08/2023   UIBC 142 12/08/2023   IRONPCTSAT 28 12/08/2023   Lab Results  Component Value Date   RETICCTPCT 0.6 12/08/2023   RBC 3.81 (L) 01/11/2024   RETICCTABS 27.5 03/21/2014   Lab Results  Component Value Date   KPAFRELGTCHN 93.3 (H) 12/08/2023   LAMBDASER 61.1 (H) 12/08/2023   KAPLAMBRATIO 1.53 12/08/2023   Lab Results  Component Value Date   IGGSERUM 1,759 (H) 12/08/2023   IGA 274 12/08/2023   IGMSERUM 61 12/08/2023   Lab Results  Component Value Date   TOTALPROTELP 7.5 10/26/2023   ALBUMINELP 3.5 10/26/2023   A1GS 0.2 10/26/2023   A2GS 0.8 10/26/2023   BETS 0.9 10/26/2023   BETA2SER 0.5 11/07/2014   GAMS 2.1 (H) 10/26/2023   MSPIKE Not Observed 10/26/2023   SPEI Comment 10/26/2023     Chemistry      Component Value Date/Time   NA 138 01/11/2024 0815   NA 143 02/22/2017 0744   K 5.0 01/11/2024 0815   K 4.0 02/22/2017 0744   CL 102 01/11/2024 0815   CL 106 02/22/2017 0744   CO2 24 01/11/2024 0815   CO2 27 02/22/2017 0744   BUN 42 (H) 01/11/2024 0815   BUN 26 (H) 02/22/2017 0744   CREATININE 3.02 (H) 01/11/2024 0815    CREATININE 2.3 (H) 02/22/2017 0744      Component Value Date/Time   CALCIUM  8.5 (L) 01/11/2024 0815   CALCIUM  9.3 02/22/2017 0744   ALKPHOS 121 01/11/2024 0815   ALKPHOS 80 02/22/2017 0744   AST 31 01/11/2024 0815   ALT 27 01/11/2024 0815   ALT 22 02/22/2017 0744   BILITOT 0.3 01/11/2024 0815       Impression and Plan: [name] is a very pleasant 73 yo African American gentleman with IgG kappa myeloma.  We are holding his treatment.  He has been off treatment now for quite a while.  I am so happy that his myeloma levels really have not been a problem.  He does other issues.  He has diabetes.  He has I think systemic arthritic issues.  He did have the surgery for his right foot.  He will get his testosterone  today.  This does seem to help his quality of life.  We will go ahead and give him his Aredia  today.SABRA  He gets his Retacrit  also.  I realize that he does have the other issues.  Again, from my point of view, I think he is doing well with respect to the myeloma.  There is been no evidence that there has been myeloma progression.  We will continue to follow him along monthly for right now.  I know he will have a very busy Thanksgiving.   Maude JONELLE Crease, MD 11/18/202511:24 AM

## 2024-01-12 LAB — IGG, IGA, IGM
IgA: 322 mg/dL (ref 61–437)
IgG (Immunoglobin G), Serum: 1961 mg/dL — ABNORMAL HIGH (ref 603–1613)
IgM (Immunoglobulin M), Srm: 59 mg/dL (ref 15–143)

## 2024-01-12 LAB — KAPPA/LAMBDA LIGHT CHAINS
Kappa free light chain: 95.1 mg/L — ABNORMAL HIGH (ref 3.3–19.4)
Kappa, lambda light chain ratio: 1.55 (ref 0.26–1.65)
Lambda free light chains: 61.3 mg/L — ABNORMAL HIGH (ref 5.7–26.3)

## 2024-01-12 LAB — TESTOSTERONE: Testosterone: 344 ng/dL (ref 264–916)

## 2024-01-13 LAB — PROTEIN ELECTROPHORESIS, SERUM, WITH REFLEX
A/G Ratio: 0.9 (ref 0.7–1.7)
Albumin ELP: 3.4 g/dL (ref 2.9–4.4)
Alpha-1-Globulin: 0.2 g/dL (ref 0.0–0.4)
Alpha-2-Globulin: 0.8 g/dL (ref 0.4–1.0)
Beta Globulin: 1 g/dL (ref 0.7–1.3)
Gamma Globulin: 1.8 g/dL (ref 0.4–1.8)
Globulin, Total: 3.8 g/dL (ref 2.2–3.9)
Total Protein ELP: 7.2 g/dL (ref 6.0–8.5)

## 2024-02-11 ENCOUNTER — Inpatient Hospital Stay

## 2024-02-11 ENCOUNTER — Inpatient Hospital Stay: Admitting: Hematology & Oncology

## 2024-02-11 ENCOUNTER — Inpatient Hospital Stay: Admitting: Family

## 2024-02-11 ENCOUNTER — Inpatient Hospital Stay: Attending: Hematology & Oncology

## 2024-02-11 ENCOUNTER — Other Ambulatory Visit: Payer: Self-pay

## 2024-02-11 VITALS — BP 156/78 | HR 78 | Resp 18

## 2024-02-11 VITALS — BP 158/71 | HR 86 | Temp 98.6°F | Resp 18 | Ht 71.0 in | Wt 173.0 lb

## 2024-02-11 DIAGNOSIS — Z299 Encounter for prophylactic measures, unspecified: Secondary | ICD-10-CM

## 2024-02-11 DIAGNOSIS — C9000 Multiple myeloma not having achieved remission: Secondary | ICD-10-CM

## 2024-02-11 DIAGNOSIS — D631 Anemia in chronic kidney disease: Secondary | ICD-10-CM | POA: Diagnosis not present

## 2024-02-11 DIAGNOSIS — E291 Testicular hypofunction: Secondary | ICD-10-CM | POA: Insufficient documentation

## 2024-02-11 DIAGNOSIS — N184 Chronic kidney disease, stage 4 (severe): Secondary | ICD-10-CM

## 2024-02-11 DIAGNOSIS — E349 Endocrine disorder, unspecified: Secondary | ICD-10-CM | POA: Diagnosis not present

## 2024-02-11 DIAGNOSIS — E1151 Type 2 diabetes mellitus with diabetic peripheral angiopathy without gangrene: Secondary | ICD-10-CM

## 2024-02-11 DIAGNOSIS — D500 Iron deficiency anemia secondary to blood loss (chronic): Secondary | ICD-10-CM

## 2024-02-11 DIAGNOSIS — M4712 Other spondylosis with myelopathy, cervical region: Secondary | ICD-10-CM

## 2024-02-11 LAB — CMP (CANCER CENTER ONLY)
ALT: 28 U/L (ref 0–44)
AST: 35 U/L (ref 15–41)
Albumin: 4.1 g/dL (ref 3.5–5.0)
Alkaline Phosphatase: 105 U/L (ref 38–126)
Anion gap: 14 (ref 5–15)
BUN: 53 mg/dL — ABNORMAL HIGH (ref 8–23)
CO2: 22 mmol/L (ref 22–32)
Calcium: 8.7 mg/dL — ABNORMAL LOW (ref 8.9–10.3)
Chloride: 103 mmol/L (ref 98–111)
Creatinine: 3.58 mg/dL — ABNORMAL HIGH (ref 0.61–1.24)
GFR, Estimated: 17 mL/min — ABNORMAL LOW
Glucose, Bld: 100 mg/dL — ABNORMAL HIGH (ref 70–99)
Potassium: 4.2 mmol/L (ref 3.5–5.1)
Sodium: 139 mmol/L (ref 135–145)
Total Bilirubin: 0.4 mg/dL (ref 0.0–1.2)
Total Protein: 7.7 g/dL (ref 6.5–8.1)

## 2024-02-11 LAB — CBC WITH DIFFERENTIAL (CANCER CENTER ONLY)
Abs Immature Granulocytes: 0.03 K/uL (ref 0.00–0.07)
Basophils Absolute: 0.1 K/uL (ref 0.0–0.1)
Basophils Relative: 1 %
Eosinophils Absolute: 0.2 K/uL (ref 0.0–0.5)
Eosinophils Relative: 3 %
HCT: 30.8 % — ABNORMAL LOW (ref 39.0–52.0)
Hemoglobin: 9.5 g/dL — ABNORMAL LOW (ref 13.0–17.0)
Immature Granulocytes: 1 %
Lymphocytes Relative: 18 %
Lymphs Abs: 1.1 K/uL (ref 0.7–4.0)
MCH: 24.4 pg — ABNORMAL LOW (ref 26.0–34.0)
MCHC: 30.8 g/dL (ref 30.0–36.0)
MCV: 79 fL — ABNORMAL LOW (ref 80.0–100.0)
Monocytes Absolute: 0.7 K/uL (ref 0.1–1.0)
Monocytes Relative: 11 %
Neutro Abs: 4 K/uL (ref 1.7–7.7)
Neutrophils Relative %: 66 %
Platelet Count: 188 K/uL (ref 150–400)
RBC: 3.9 MIL/uL — ABNORMAL LOW (ref 4.22–5.81)
RDW: 17.1 % — ABNORMAL HIGH (ref 11.5–15.5)
WBC Count: 6 K/uL (ref 4.0–10.5)
nRBC: 0 % (ref 0.0–0.2)

## 2024-02-11 LAB — IRON AND IRON BINDING CAPACITY (CC-WL,HP ONLY)
Iron: 82 ug/dL (ref 45–182)
Saturation Ratios: 38 % (ref 17.9–39.5)
TIBC: 214 ug/dL — ABNORMAL LOW (ref 250–450)
UIBC: 133 ug/dL

## 2024-02-11 LAB — LACTATE DEHYDROGENASE: LDH: 274 U/L — ABNORMAL HIGH (ref 105–235)

## 2024-02-11 LAB — FERRITIN: Ferritin: 1850 ng/mL — ABNORMAL HIGH (ref 24–336)

## 2024-02-11 MED ORDER — EPOETIN ALFA-EPBX 40000 UNIT/ML IJ SOLN
40000.0000 [IU] | Freq: Once | INTRAMUSCULAR | Status: AC
  Administered 2024-02-11: 40000 [IU] via SUBCUTANEOUS
  Filled 2024-02-11: qty 1

## 2024-02-11 MED ORDER — TESTOSTERONE CYPIONATE 200 MG/ML IM SOLN
400.0000 mg | Freq: Once | INTRAMUSCULAR | Status: AC
  Administered 2024-02-11: 400 mg via INTRAMUSCULAR
  Filled 2024-02-11: qty 2

## 2024-02-11 MED ORDER — HYDROMORPHONE HCL 1 MG/ML IJ SOLN
2.0000 mg | Freq: Once | INTRAMUSCULAR | Status: AC
  Administered 2024-02-11: 2 mg via INTRAVENOUS
  Filled 2024-02-11: qty 2

## 2024-02-11 MED ORDER — CYANOCOBALAMIN 1000 MCG/ML IJ SOLN
1000.0000 ug | Freq: Once | INTRAMUSCULAR | Status: AC
  Administered 2024-02-11: 1000 ug via INTRAMUSCULAR
  Filled 2024-02-11: qty 1

## 2024-02-11 MED ORDER — SODIUM CHLORIDE 0.9 % IV SOLN: INTRAVENOUS | Status: DC

## 2024-02-11 MED ORDER — SODIUM CHLORIDE 0.9 % IV SOLN
60.0000 mg | Freq: Once | INTRAVENOUS | Status: AC
  Administered 2024-02-11: 60 mg via INTRAVENOUS
  Filled 2024-02-11: qty 10

## 2024-02-11 NOTE — Patient Instructions (Signed)

## 2024-02-11 NOTE — Progress Notes (Signed)
 " Hematology and Oncology Follow Up Visit  [name] [id number] 10-09-1950 73 y.o. 02/11/2024   Principle Diagnosis:  IgG kappa myeloma Anemia secondary to renal insufficiency Intermittent iron  - deficiency anemia Hypotestosteronemia   Prior Therapy:        Kyprolis /Cytoxan  - s/p cycle #40 --DC on 12/24/2022   Current Therapy:  Aredia  60 mg IV q  month -- Started on 07/2022 Retacrit  SQ as needed for hemoglobin less than 10 DepoTestosterone 400 mg q 4 weeks B12 IM monthly IV iron  as indicated        Interim History:  [name] is here today for follow-up.  He had a very nice Thanksgiving.  He is getting ready for Christmas.  Overall, he is managing pretty well.  He does have a lot of pain.  He has arthritis.  He also may have an element of fibromyalgia.  He is getting his testosterone  every month.  He gets his B12 every month.  He has done well off treatment for the myeloma.  When we last saw him, there was no monoclonal spike in his blood.  His IgG level was 1961 mg/dL.  The Kappa light chain was 9.5 mg/dL.  He does have the issue with renal insufficiency.  He is on Retacrit .  He does get Aredia  which does seem to help his bones.  His blood sugars have really been under good control.  I have to give him credit for this.    He does have low testosterone .  He does get testosterone  injections.  His last testosterone  level back in November was 344.    Overall, I would have to say that his performance status is probably ECOG 2.    Medications:  Allergies as of 02/11/2024       Reactions   Deltasone [prednisone] Itching, Dermatitis, Rash   Iodinated Contrast Media Rash   Patient states he was instructed not to take IV contrast.  In 2008 he had an unknown reaction, and was told not to take it again.  He was also told not to take it due to his kidneys.   Iodine Anxiety, Rash, Other (See Comments)   Didn't feel right instructed not to take per MD--something with his port    Ioversol Other (See Comments)   Unknown reaction        Medication List        Accurate as of February 11, 2024  8:18 AM. If you have any questions, ask your nurse or doctor.          amLODipine  5 MG tablet Commonly known as: NORVASC  Take 5 mg by mouth 2 (two) times daily.   dicyclomine  10 MG capsule Commonly known as: BENTYL  Take 10 mg by mouth 3 (three) times daily.   doxycycline  100 MG tablet Commonly known as: VIBRA -TABS Take 1 tablet (100 mg total) by mouth 2 (two) times daily.   folic acid  1 MG tablet Commonly known as: FOLVITE  Take 2 tablets (2 mg total) by mouth daily.   furosemide  40 MG tablet Commonly known as: LASIX  Take 1 tablet (40 mg total) by mouth daily.   gabapentin  300 MG capsule Commonly known as: NEURONTIN  TAKE 1 CAPSULE BY MOUTH THREE TIMES A DAY   ibuprofen  800 MG tablet Commonly known as: ADVIL  Take 1 tablet (800 mg total) by mouth every 6 (six) hours as needed.   Januvia 100 MG tablet Generic drug: sitaGLIPtin Take 100 mg by mouth daily.   metoprolol  succinate 25 MG 24 hr  tablet Commonly known as: TOPROL -XL Take 25 mg by mouth in the morning.   morphine  30 MG 12 hr tablet Commonly known as: MS CONTIN  Take 30 mg by mouth 3 (three) times daily.   omeprazole  40 MG capsule Commonly known as: PRILOSEC TAKE 1 CAPSULE BY MOUTH TWICE A DAY   ondansetron  4 MG tablet Commonly known as: ZOFRAN  Take 1 tablet (4 mg total) by mouth every 6 (six) hours as needed for nausea.   oxyCODONE -acetaminophen  10-325 MG tablet Commonly known as: PERCOCET Take 1 tablet by mouth every 4 (four) hours.   oxyCODONE -acetaminophen  5-325 MG tablet Commonly known as: Percocet Take 1 tablet by mouth every 4 (four) hours as needed for severe pain (pain score 7-10).   OxyCONTIN  20 MG 12 hr tablet Generic drug: oxyCODONE  Take 20 mg by mouth every 12 (twelve) hours.   sildenafil 100 MG tablet Commonly known as: VIAGRA Take 100 mg by mouth daily as  needed for erectile dysfunction.   SUMAtriptan  100 MG tablet Commonly known as: IMITREX  Take 100 mg by mouth daily as needed for migraine.   torsemide 20 MG tablet Commonly known as: DEMADEX Take 20 mg by mouth 2 (two) times daily.   Vitamin D  (Ergocalciferol ) 1.25 MG (50000 UNIT) Caps capsule Commonly known as: DRISDOL  Take 50,000 Units by mouth every Saturday.        Allergies:  Allergies  Allergen Reactions   Deltasone [Prednisone] Itching, Dermatitis and Rash   Iodinated Contrast Media Rash    Patient states he was instructed not to take IV contrast.  In 2008 he had an unknown reaction, and was told not to take it again.  He was also told not to take it due to his kidneys.   Iodine Anxiety, Rash and Other (See Comments)    Didn't feel right instructed not to take per MD--something with his port    Ioversol Other (See Comments)    Unknown reaction    Past Medical History, Surgical history, Social history, and Family History were reviewed and updated.  Review of Systems:  Review of Systems  Constitutional:  Positive for malaise/fatigue.  HENT: Negative.    Eyes: Negative.   Respiratory: Negative.    Cardiovascular: Negative.   Gastrointestinal: Negative.   Genitourinary: Negative.   Musculoskeletal:  Positive for joint pain.  Skin: Negative.   Neurological: Negative.   Endo/Heme/Allergies: Negative.   Psychiatric/Behavioral: Negative.       Physical Exam:  Vital signs show temperature of 98.6.  Pulse 86.  Blood pressure 158/71.  Weight is 173 pounds.   Wt Readings from Last 3 Encounters:  01/11/24 169 lb (76.7 kg)  12/08/23 171 lb (77.6 kg)  10/26/23 159 lb 6.4 oz (72.3 kg)   Physical Exam Vitals reviewed.  HENT:     Head: Normocephalic and atraumatic.  Eyes:     Pupils: Pupils are equal, round, and reactive to light.  Cardiovascular:     Rate and Rhythm: Normal rate and regular rhythm.     Heart sounds: Normal heart sounds.  Pulmonary:      Effort: Pulmonary effort is normal.     Breath sounds: Normal breath sounds.  Abdominal:     General: Bowel sounds are normal.     Palpations: Abdomen is soft.  Musculoskeletal:        General: No tenderness or deformity. Normal range of motion.     Cervical back: Normal range of motion.     Comments: He has a walking sandal on  the right foot.  Lymphadenopathy:     Cervical: No cervical adenopathy.  Skin:    General: Skin is warm and dry.     Findings: No erythema or rash.  Neurological:     Mental Status: He is alert and oriented to person, place, and time.  Psychiatric:        Behavior: Behavior normal.        Thought Content: Thought content normal.        Judgment: Judgment normal.      Lab Results  Component Value Date   WBC 6.9 01/11/2024   HGB 9.4 (L) 01/11/2024   HCT 29.9 (L) 01/11/2024   MCV 78.5 (L) 01/11/2024   PLT 182 01/11/2024   Lab Results  Component Value Date   FERRITIN 1,784 (H) 01/11/2024   IRON  120 01/11/2024   TIBC 192 (L) 01/11/2024   UIBC 72 01/11/2024   IRONPCTSAT 63 (H) 01/11/2024   Lab Results  Component Value Date   RETICCTPCT 0.6 12/08/2023   RBC 3.81 (L) 01/11/2024   RETICCTABS 27.5 03/21/2014   Lab Results  Component Value Date   KPAFRELGTCHN 95.1 (H) 01/11/2024   LAMBDASER 61.3 (H) 01/11/2024   KAPLAMBRATIO 1.55 01/11/2024   Lab Results  Component Value Date   IGGSERUM 1,961 (H) 01/11/2024   IGA 322 01/11/2024   IGMSERUM 59 01/11/2024   Lab Results  Component Value Date   TOTALPROTELP 7.2 01/11/2024   ALBUMINELP 3.4 01/11/2024   A1GS 0.2 01/11/2024   A2GS 0.8 01/11/2024   BETS 1.0 01/11/2024   BETA2SER 0.5 11/07/2014   GAMS 1.8 01/11/2024   MSPIKE Not Observed 01/11/2024   SPEI Comment 10/26/2023     Chemistry      Component Value Date/Time   NA 138 01/11/2024 0815   NA 143 02/22/2017 0744   K 5.0 01/11/2024 0815   K 4.0 02/22/2017 0744   CL 102 01/11/2024 0815   CL 106 02/22/2017 0744   CO2 24 01/11/2024  0815   CO2 27 02/22/2017 0744   BUN 42 (H) 01/11/2024 0815   BUN 26 (H) 02/22/2017 0744   CREATININE 3.02 (H) 01/11/2024 0815   CREATININE 2.3 (H) 02/22/2017 0744      Component Value Date/Time   CALCIUM  8.5 (L) 01/11/2024 0815   CALCIUM  9.3 02/22/2017 0744   ALKPHOS 121 01/11/2024 0815   ALKPHOS 80 02/22/2017 0744   AST 31 01/11/2024 0815   ALT 27 01/11/2024 0815   ALT 22 02/22/2017 0744   BILITOT 0.3 01/11/2024 0815       Impression and Plan: [name] is a very pleasant 73 yo African American gentleman with IgG kappa myeloma.  We are holding his treatment.  He has been off treatment now for quite a while.  I am so happy that his myeloma levels really have not been a problem.  He does other issues.  He has diabetes.  He has I think systemic arthritic issues.  He did have the surgery for his right foot.  He will get his testosterone  today.  This does seem to help his quality of life.  We will go ahead and give him his Aredia  today.SABRA  He gets his Retacrit  also.  I realize that he does have the other issues.  Again, from my point of view, I think he is doing well with respect to the myeloma.  There is been no evidence that there has been myeloma progression.  Hopefully, he will be able to get through  the Holiday season without any problems.  We will plan to get him back in another month.   Maude JONELLE Crease, MD 12/19/20258:18 AM  "

## 2024-02-11 NOTE — Patient Instructions (Signed)
 Pamidronate Injection What is this medication? PAMIDRONATE (pa mi DROE nate) treats high calcium levels in the blood caused by cancer. It may also be used with chemotherapy to treat weakened bones caused by cancer. It can also be used to treat Paget's disease of the bone. It works by slowing down the release of calcium from bones. This lowers calcium levels in your blood. It also makes your bones stronger and less likely to break (fracture). It belongs to a group of medications called bisphosphonates. This medicine may be used for other purposes; ask your health care provider or pharmacist if you have questions. COMMON BRAND NAME(S): Aredia What should I tell my care team before I take this medication? They need to know if you have any of these conditions: Bleeding disorder Cancer Dental disease Kidney disease Low levels of calcium or other minerals in the blood Low red blood cell counts Receiving steroids, such as dexamethasone or prednisone An unusual or allergic reaction to pamidronate, other medications, foods, dyes or preservatives Pregnant or trying to get pregnant Breast-feeding How should I use this medication? This medication is injected into a vein. It is given by your care team in a hospital or clinic setting. Talk to your care team about the use of this medication in children. Special care may be needed. Overdosage: If you think you have taken too much of this medicine contact a poison control center or emergency room at once. NOTE: This medicine is only for you. Do not share this medicine with others. What if I miss a dose? Keep appointments for follow-up doses. It is important not to miss your dose. Call your care team if you are unable to keep an appointment. What may interact with this medication? Certain antibiotics given by injection Medications for inflammation or pain, such as ibuprofen, naproxen Some diuretics, such as bumetanide, furosemide Cyclosporine Parathyroid  hormone Tacrolimus Teriparatide Thalidomide This list may not describe all possible interactions. Give your health care provider a list of all the medicines, herbs, non-prescription drugs, or dietary supplements you use. Also tell them if you smoke, drink alcohol, or use illegal drugs. Some items may interact with your medicine. What should I watch for while using this medication? Visit your care team for regular checks on your progress. It may be some time before you see the benefit from this medication. Some people who take this medication have severe bone, joint, or muscle pain. This medication may also increase your risk for jaw problems or a broken thigh bone. Tell your care team right away if you have severe pain in your jaw, bones, joints, or muscles. Tell your care team if you have any pain that does not go away or that gets worse. Tell your dentist and dental surgeon that you are taking this medication. You should not have major dental surgery while on this medication. See your dentist to have a dental exam and fix any dental problems before starting this medication. Take good care of your teeth while on this medication. Make sure you see your dentist for regular follow-up appointments. You should make sure you get enough calcium and vitamin D while you are taking this medication. Discuss the foods you eat and the vitamins you take with your care team. You may need bloodwork while you are taking this medication. Talk to your care team if you wish to become pregnant or think you might be pregnant. This medication can cause serious birth defects. What side effects may I notice from receiving  this medication? Side effects that you should report to your care team as soon as possible: Allergic reactions--skin rash, itching, hives, swelling of the face, lips, tongue, or throat Kidney injury--decrease in the amount of urine, swelling of the ankles, hands, or feet Low calcium level--muscle pain or  cramps, confusion, tingling, or numbness in the hands or feet Osteonecrosis of the jaw--pain, swelling, or redness in the mouth, numbness of the jaw, poor healing after dental work, unusual discharge from the mouth, visible bones in the mouth Severe bone, joint, or muscle pain Side effects that usually do not require medical attention (report to your care team if they continue or are bothersome): Constipation Fatigue Fever Loss of appetite Nausea Pain, redness, or irritation at injection site Stomach pain This list may not describe all possible side effects. Call your doctor for medical advice about side effects. You may report side effects to FDA at 1-800-FDA-1088. Where should I keep my medication? This medication is given in a hospital or clinic. It will not be stored at home. NOTE: This sheet is a summary. It may not cover all possible information. If you have questions about this medicine, talk to your doctor, pharmacist, or health care provider.  2024 Elsevier/Gold Standard (2021-03-31 00:00:00)

## 2024-02-12 LAB — IGG, IGA, IGM
IgA: 306 mg/dL (ref 61–437)
IgG (Immunoglobin G), Serum: 1992 mg/dL — ABNORMAL HIGH (ref 603–1613)
IgM (Immunoglobulin M), Srm: 57 mg/dL (ref 15–143)

## 2024-02-12 LAB — TESTOSTERONE: Testosterone: 238 ng/dL — ABNORMAL LOW (ref 264–916)

## 2024-02-14 LAB — KAPPA/LAMBDA LIGHT CHAINS
Kappa free light chain: 94 mg/L — ABNORMAL HIGH (ref 3.3–19.4)
Kappa, lambda light chain ratio: 1.73 — ABNORMAL HIGH (ref 0.26–1.65)
Lambda free light chains: 54.4 mg/L — ABNORMAL HIGH (ref 5.7–26.3)

## 2024-02-15 ENCOUNTER — Other Ambulatory Visit: Payer: Self-pay | Admitting: *Deleted

## 2024-02-15 DIAGNOSIS — D631 Anemia in chronic kidney disease: Secondary | ICD-10-CM

## 2024-02-15 LAB — PROTEIN ELECTROPHORESIS, SERUM, WITH REFLEX
A/G Ratio: 0.9 (ref 0.7–1.7)
Albumin ELP: 3.5 g/dL (ref 2.9–4.4)
Alpha-1-Globulin: 0.2 g/dL (ref 0.0–0.4)
Alpha-2-Globulin: 0.9 g/dL (ref 0.4–1.0)
Beta Globulin: 1 g/dL (ref 0.7–1.3)
Gamma Globulin: 2 g/dL — ABNORMAL HIGH (ref 0.4–1.8)
Globulin, Total: 4.1 g/dL — ABNORMAL HIGH (ref 2.2–3.9)
Total Protein ELP: 7.6 g/dL (ref 6.0–8.5)

## 2024-02-16 ENCOUNTER — Telehealth: Payer: Self-pay | Admitting: Internal Medicine

## 2024-02-16 NOTE — Telephone Encounter (Signed)
 Copied from CRM [id number]. Topic: General - Other >> Feb 15, 2024  4:21 PM Zebedee SAUNDERS wrote: Reason for CRM: Received call from [facility] per [place] ph: [phone] verification of Sim Emery CROME, MD is pt's PCP. Please call [facility] at [phone].

## 2024-03-17 ENCOUNTER — Inpatient Hospital Stay

## 2024-03-17 ENCOUNTER — Inpatient Hospital Stay: Admitting: Hematology & Oncology

## 2024-03-17 ENCOUNTER — Inpatient Hospital Stay: Attending: Hematology & Oncology

## 2024-03-17 VITALS — BP 155/72 | HR 88 | Temp 98.4°F | Wt 178.1 lb

## 2024-03-17 DIAGNOSIS — E611 Iron deficiency: Secondary | ICD-10-CM | POA: Insufficient documentation

## 2024-03-17 DIAGNOSIS — N184 Chronic kidney disease, stage 4 (severe): Secondary | ICD-10-CM | POA: Insufficient documentation

## 2024-03-17 DIAGNOSIS — M4712 Other spondylosis with myelopathy, cervical region: Secondary | ICD-10-CM

## 2024-03-17 DIAGNOSIS — D631 Anemia in chronic kidney disease: Secondary | ICD-10-CM

## 2024-03-17 DIAGNOSIS — C9000 Multiple myeloma not having achieved remission: Secondary | ICD-10-CM

## 2024-03-17 DIAGNOSIS — D500 Iron deficiency anemia secondary to blood loss (chronic): Secondary | ICD-10-CM

## 2024-03-17 DIAGNOSIS — E349 Endocrine disorder, unspecified: Secondary | ICD-10-CM

## 2024-03-17 DIAGNOSIS — Z299 Encounter for prophylactic measures, unspecified: Secondary | ICD-10-CM

## 2024-03-17 DIAGNOSIS — E2740 Unspecified adrenocortical insufficiency: Secondary | ICD-10-CM | POA: Insufficient documentation

## 2024-03-17 DIAGNOSIS — M797 Fibromyalgia: Secondary | ICD-10-CM | POA: Insufficient documentation

## 2024-03-17 DIAGNOSIS — E538 Deficiency of other specified B group vitamins: Secondary | ICD-10-CM | POA: Insufficient documentation

## 2024-03-17 DIAGNOSIS — G8929 Other chronic pain: Secondary | ICD-10-CM | POA: Insufficient documentation

## 2024-03-17 LAB — CMP (CANCER CENTER ONLY)
ALT: 31 U/L (ref 0–44)
AST: 30 U/L (ref 15–41)
Albumin: 4.3 g/dL (ref 3.5–5.0)
Alkaline Phosphatase: 122 U/L (ref 38–126)
Anion gap: 11 (ref 5–15)
BUN: 51 mg/dL — ABNORMAL HIGH (ref 8–23)
CO2: 23 mmol/L (ref 22–32)
Calcium: 8.5 mg/dL — ABNORMAL LOW (ref 8.9–10.3)
Chloride: 104 mmol/L (ref 98–111)
Creatinine: 3.69 mg/dL — ABNORMAL HIGH (ref 0.61–1.24)
GFR, Estimated: 16 mL/min — ABNORMAL LOW
Glucose, Bld: 98 mg/dL (ref 70–99)
Potassium: 4.3 mmol/L (ref 3.5–5.1)
Sodium: 138 mmol/L (ref 135–145)
Total Bilirubin: 0.3 mg/dL (ref 0.0–1.2)
Total Protein: 8 g/dL (ref 6.5–8.1)

## 2024-03-17 LAB — CBC WITH DIFFERENTIAL (CANCER CENTER ONLY)
Abs Immature Granulocytes: 0.01 K/uL (ref 0.00–0.07)
Basophils Absolute: 0.1 K/uL (ref 0.0–0.1)
Basophils Relative: 1 %
Eosinophils Absolute: 0.2 K/uL (ref 0.0–0.5)
Eosinophils Relative: 4 %
HCT: 30.2 % — ABNORMAL LOW (ref 39.0–52.0)
Hemoglobin: 9.3 g/dL — ABNORMAL LOW (ref 13.0–17.0)
Immature Granulocytes: 0 %
Lymphocytes Relative: 16 %
Lymphs Abs: 1 K/uL (ref 0.7–4.0)
MCH: 23.8 pg — ABNORMAL LOW (ref 26.0–34.0)
MCHC: 30.8 g/dL (ref 30.0–36.0)
MCV: 77.4 fL — ABNORMAL LOW (ref 80.0–100.0)
Monocytes Absolute: 0.8 K/uL (ref 0.1–1.0)
Monocytes Relative: 12 %
Neutro Abs: 4.2 K/uL (ref 1.7–7.7)
Neutrophils Relative %: 67 %
Platelet Count: 170 K/uL (ref 150–400)
RBC: 3.9 MIL/uL — ABNORMAL LOW (ref 4.22–5.81)
RDW: 17.7 % — ABNORMAL HIGH (ref 11.5–15.5)
WBC Count: 6.3 K/uL (ref 4.0–10.5)
nRBC: 0 % (ref 0.0–0.2)

## 2024-03-17 LAB — VITAMIN B12: Vitamin B-12: 1296 pg/mL — ABNORMAL HIGH (ref 180–914)

## 2024-03-17 LAB — LACTATE DEHYDROGENASE: LDH: 260 U/L — ABNORMAL HIGH (ref 105–235)

## 2024-03-17 MED ORDER — TESTOSTERONE CYPIONATE 200 MG/ML IM SOLN
400.0000 mg | Freq: Once | INTRAMUSCULAR | Status: AC
  Administered 2024-03-17: 400 mg via INTRAMUSCULAR
  Filled 2024-03-17: qty 2

## 2024-03-17 MED ORDER — SODIUM CHLORIDE 0.9 % IV SOLN
60.0000 mg | Freq: Once | INTRAVENOUS | Status: DC
  Administered 2024-03-17: 60 mg via INTRAVENOUS
  Filled 2024-03-17: qty 10

## 2024-03-17 MED ORDER — SODIUM CHLORIDE 0.9 % IV SOLN: INTRAVENOUS | Status: DC

## 2024-03-17 MED ORDER — CYANOCOBALAMIN 1000 MCG/ML IJ SOLN
1000.0000 ug | Freq: Once | INTRAMUSCULAR | Status: AC
  Administered 2024-03-17: 1000 ug via INTRAMUSCULAR
  Filled 2024-03-17: qty 1

## 2024-03-17 MED ORDER — HYDROMORPHONE HCL 1 MG/ML IJ SOLN
2.0000 mg | Freq: Once | INTRAMUSCULAR | Status: DC
  Administered 2024-03-17: 2 mg via INTRAVENOUS
  Filled 2024-03-17: qty 2

## 2024-03-17 MED ORDER — EPOETIN ALFA-EPBX 40000 UNIT/ML IJ SOLN
40000.0000 [IU] | Freq: Once | INTRAMUSCULAR | Status: AC
  Administered 2024-03-17: 40000 [IU] via SUBCUTANEOUS
  Filled 2024-03-17: qty 1

## 2024-03-17 NOTE — Patient Instructions (Signed)
 Pamidronate Injection What is this medication? PAMIDRONATE (pa mi DROE nate) treats high calcium levels in the blood caused by cancer. It may also be used with chemotherapy to treat weakened bones caused by cancer. It can also be used to treat Paget's disease of the bone. It works by slowing down the release of calcium from bones. This lowers calcium levels in your blood. It also makes your bones stronger and less likely to break (fracture). It belongs to a group of medications called bisphosphonates. This medicine may be used for other purposes; ask your health care provider or pharmacist if you have questions. COMMON BRAND NAME(S): Aredia What should I tell my care team before I take this medication? They need to know if you have any of these conditions: Bleeding disorder Cancer Dental disease Kidney disease Low levels of calcium or other minerals in the blood Low red blood cell counts Receiving steroids, such as dexamethasone or prednisone An unusual or allergic reaction to pamidronate, other medications, foods, dyes or preservatives Pregnant or trying to get pregnant Breast-feeding How should I use this medication? This medication is injected into a vein. It is given by your care team in a hospital or clinic setting. Talk to your care team about the use of this medication in children. Special care may be needed. Overdosage: If you think you have taken too much of this medicine contact a poison control center or emergency room at once. NOTE: This medicine is only for you. Do not share this medicine with others. What if I miss a dose? Keep appointments for follow-up doses. It is important not to miss your dose. Call your care team if you are unable to keep an appointment. What may interact with this medication? Certain antibiotics given by injection Medications for inflammation or pain, such as ibuprofen, naproxen Some diuretics, such as bumetanide, furosemide Cyclosporine Parathyroid  hormone Tacrolimus Teriparatide Thalidomide This list may not describe all possible interactions. Give your health care provider a list of all the medicines, herbs, non-prescription drugs, or dietary supplements you use. Also tell them if you smoke, drink alcohol, or use illegal drugs. Some items may interact with your medicine. What should I watch for while using this medication? Visit your care team for regular checks on your progress. It may be some time before you see the benefit from this medication. Some people who take this medication have severe bone, joint, or muscle pain. This medication may also increase your risk for jaw problems or a broken thigh bone. Tell your care team right away if you have severe pain in your jaw, bones, joints, or muscles. Tell your care team if you have any pain that does not go away or that gets worse. Tell your dentist and dental surgeon that you are taking this medication. You should not have major dental surgery while on this medication. See your dentist to have a dental exam and fix any dental problems before starting this medication. Take good care of your teeth while on this medication. Make sure you see your dentist for regular follow-up appointments. You should make sure you get enough calcium and vitamin D while you are taking this medication. Discuss the foods you eat and the vitamins you take with your care team. You may need bloodwork while you are taking this medication. Talk to your care team if you wish to become pregnant or think you might be pregnant. This medication can cause serious birth defects. What side effects may I notice from receiving  this medication? Side effects that you should report to your care team as soon as possible: Allergic reactions--skin rash, itching, hives, swelling of the face, lips, tongue, or throat Kidney injury--decrease in the amount of urine, swelling of the ankles, hands, or feet Low calcium level--muscle pain or  cramps, confusion, tingling, or numbness in the hands or feet Osteonecrosis of the jaw--pain, swelling, or redness in the mouth, numbness of the jaw, poor healing after dental work, unusual discharge from the mouth, visible bones in the mouth Severe bone, joint, or muscle pain Side effects that usually do not require medical attention (report to your care team if they continue or are bothersome): Constipation Fatigue Fever Loss of appetite Nausea Pain, redness, or irritation at injection site Stomach pain This list may not describe all possible side effects. Call your doctor for medical advice about side effects. You may report side effects to FDA at 1-800-FDA-1088. Where should I keep my medication? This medication is given in a hospital or clinic. It will not be stored at home. NOTE: This sheet is a summary. It may not cover all possible information. If you have questions about this medicine, talk to your doctor, pharmacist, or health care provider.  2024 Elsevier/Gold Standard (2021-03-31 00:00:00)

## 2024-03-17 NOTE — Patient Instructions (Signed)

## 2024-03-17 NOTE — Patient Instructions (Signed)
 Vitamin B12 Deficiency Vitamin B12 deficiency means that your body does not have enough vitamin B12. The body needs this important vitamin: To make red blood cells. To make genes (DNA). To help the nerves work. If you do not have enough vitamin B12 in your body, you can have health problems, such as not having enough red blood cells in the blood (anemia). What are the causes? Not eating enough foods that contain vitamin B12. Not being able to take in (absorb) vitamin B12 from the food that you eat. Certain diseases. A condition in which the body does not make enough of a certain protein. This results in your body not taking in enough vitamin B12. Having a surgery in which part of the stomach or small intestine is taken out. Taking medicines that make it hard for the body to take in vitamin B12. These include: Heartburn medicines. Some medicines that are used to treat diabetes. What increases the risk? Being an older adult. Eating a vegetarian or vegan diet that does not include any foods that come from animals. Not eating enough foods that contain vitamin B12 while you are pregnant. Taking certain medicines. Having alcoholism. What are the signs or symptoms? In some cases, there are no symptoms. If the condition leads to too few blood cells or nerve damage, symptoms can occur, such as: Feeling weak or tired. Not being hungry. Losing feeling (numbness) or tingling in your hands and feet. Redness and burning of the tongue. Feeling sad (depressed). Confusion or memory problems. Trouble walking. If anemia is very bad, symptoms can include: Being short of breath. Being dizzy. Having a very fast heartbeat. How is this treated? Changing the way you eat and drink, such as: Eating more foods that contain vitamin B12. Drinking little or no alcohol. Getting vitamin B12 shots. Taking vitamin B12 supplements by mouth (orally). Your doctor will tell you the dose that is best for you. Follow  these instructions at home: Eating and drinking  Eat foods that come from animals and have a lot of vitamin B12 in them. These include: Meats and poultry. This includes beef, pork, chicken, [place], and organ meats, such as liver. Seafood, such as clams, rainbow trout, salmon, tuna, and haddock. Eggs. Dairy foods such as milk, yogurt, and cheese. Eat breakfast cereals that have vitamin B12 added to them (are fortified). Check the label. The items listed above may not be a complete list of foods and beverages you can eat and drink. Contact a dietitian for more information. Alcohol use Do not drink alcohol if: Your doctor tells you not to drink. You are pregnant, may be pregnant, or are planning to become pregnant. If you drink alcohol: Limit how much you have to: 0-1 drink a day for women. 0-2 drinks a day for men. Know how much alcohol is in your drink. In the [place] one drink equals one 12 oz bottle of beer (355 mL), one 5 oz glass of wine (148 mL), or one 1 oz glass of hard liquor (44 mL). General instructions Get any vitamin B12 shots if told by your doctor. Take supplements only as told by your doctor. Follow the directions. Keep all follow-up visits. Contact a doctor if: Your symptoms come back. Your symptoms get worse or do not get better with treatment. Get help right away if: You have trouble breathing. You have a very fast heartbeat. You have chest pain. You get dizzy. You faint. These symptoms may be an emergency. Get help right away. Call 911.  Do not wait to see if the symptoms will go away. Do not drive yourself to the hospital. Summary Vitamin B12 deficiency means that your body is not getting enough of the vitamin. In some cases, there are no symptoms of this condition. Treatment may include making a change in the way you eat and drink, getting shots, or taking supplements. Eat foods that have vitamin B12 in them. This information is not intended to replace advice  given to you by your health care provider. Make sure you discuss any questions you have with your health care provider. Document Revised: 10/04/2020 Document Reviewed: 10/04/2020 Elsevier Patient Education  2024 [employer].

## 2024-03-17 NOTE — Progress Notes (Signed)
 At 1000, pt noted to be asleep in chair. No distress noted.

## 2024-03-17 NOTE — Progress Notes (Signed)
 " Hematology and Oncology Follow Up Visit  [name] [id number] June 04, 1950 74 y.o. 03/17/2024   Principle Diagnosis:  IgG kappa myeloma Anemia secondary to renal insufficiency Intermittent iron  - deficiency anemia Hypotestosteronemia   Prior Therapy:        Kyprolis /Cytoxan  - s/p cycle #40 --DC on 12/24/2022   Current Therapy:  Aredia  60 mg IV q  month -- Started on 07/2022 Retacrit  SQ as needed for hemoglobin less than 10 DepoTestosterone 400 mg q 4 weeks B12 IM monthly IV iron  as indicated        Interim History:  [name] is here today for follow-up.  He had a very nice Christmas and New Year's.  So happy that he enjoyed the holidays..  Otherwise, everything is doing pretty well.  There is no problems with his myeloma.  There is no monoclonal spike in his blood.  His last IgG level was 1992 mg/dL.  His kappa light chain was 9.4 mg/dL.  We are giving him testosterone .  His last testosterone  level was 238 ng/dL.  He does have the fibromyalgia.  He does have chronic pain.  His iron  studies that we did on him showed a ferritin of 1800 with iron  saturation of 38%.  His last vitamin B12 level that we did back in September was 889 pg/mL.  Overall, I would have to say that his performance status is probably ECOG 2.     Medications:  Allergies as of 03/17/2024       Reactions   Deltasone [prednisone] Itching, Dermatitis, Rash   Iodinated Contrast Media Rash   Patient states he was instructed not to take IV contrast.  In 2008 he had an unknown reaction, and was told not to take it again.  He was also told not to take it due to his kidneys.   Iodine Anxiety, Rash, Other (See Comments)   Didn't feel right instructed not to take per MD--something with his port   Ioversol Other (See Comments)   Unknown reaction        Medication List        Accurate as of March 17, 2024  8:17 AM. If you have any questions, ask your nurse or doctor.          amLODipine  5 MG  tablet Commonly known as: NORVASC  Take 5 mg by mouth 2 (two) times daily.   dicyclomine  10 MG capsule Commonly known as: BENTYL  Take 10 mg by mouth 3 (three) times daily.   doxycycline  100 MG tablet Commonly known as: VIBRA -TABS Take 1 tablet (100 mg total) by mouth 2 (two) times daily.   folic acid  1 MG tablet Commonly known as: FOLVITE  Take 2 tablets (2 mg total) by mouth daily.   furosemide  40 MG tablet Commonly known as: LASIX  Take 1 tablet (40 mg total) by mouth daily.   gabapentin  300 MG capsule Commonly known as: NEURONTIN  TAKE 1 CAPSULE BY MOUTH THREE TIMES A DAY   ibuprofen  800 MG tablet Commonly known as: ADVIL  Take 1 tablet (800 mg total) by mouth every 6 (six) hours as needed.   Januvia 100 MG tablet Generic drug: sitaGLIPtin Take 100 mg by mouth daily.   metoprolol  succinate 25 MG 24 hr tablet Commonly known as: TOPROL -XL Take 25 mg by mouth in the morning.   morphine  30 MG 12 hr tablet Commonly known as: MS CONTIN  Take 30 mg by mouth 3 (three) times daily.   omeprazole  40 MG capsule Commonly known as: PRILOSEC TAKE 1 CAPSULE  BY MOUTH TWICE A DAY   ondansetron  4 MG tablet Commonly known as: ZOFRAN  Take 1 tablet (4 mg total) by mouth every 6 (six) hours as needed for nausea.   oxyCODONE -acetaminophen  10-325 MG tablet Commonly known as: PERCOCET Take 1 tablet by mouth every 4 (four) hours.   oxyCODONE -acetaminophen  5-325 MG tablet Commonly known as: Percocet Take 1 tablet by mouth every 4 (four) hours as needed for severe pain (pain score 7-10).   OxyCONTIN  20 MG 12 hr tablet Generic drug: oxyCODONE  Take 20 mg by mouth every 12 (twelve) hours.   sildenafil 100 MG tablet Commonly known as: VIAGRA Take 100 mg by mouth daily as needed for erectile dysfunction.   SUMAtriptan  100 MG tablet Commonly known as: IMITREX  Take 100 mg by mouth daily as needed for migraine.   torsemide 20 MG tablet Commonly known as: DEMADEX Take 20 mg by mouth 2  (two) times daily.   Vitamin D  (Ergocalciferol ) 1.25 MG (50000 UNIT) Caps capsule Commonly known as: DRISDOL  Take 50,000 Units by mouth every Saturday.        Allergies:  Allergies  Allergen Reactions   Deltasone [Prednisone] Itching, Dermatitis and Rash   Iodinated Contrast Media Rash    Patient states he was instructed not to take IV contrast.  In 2008 he had an unknown reaction, and was told not to take it again.  He was also told not to take it due to his kidneys.   Iodine Anxiety, Rash and Other (See Comments)    Didn't feel right instructed not to take per MD--something with his port    Ioversol Other (See Comments)    Unknown reaction    Past Medical History, Surgical history, Social history, and Family History were reviewed and updated.  Review of Systems:  Review of Systems  Constitutional:  Positive for malaise/fatigue.  HENT: Negative.    Eyes: Negative.   Respiratory: Negative.    Cardiovascular: Negative.   Gastrointestinal: Negative.   Genitourinary: Negative.   Musculoskeletal:  Positive for joint pain.  Skin: Negative.   Neurological: Negative.   Endo/Heme/Allergies: Negative.   Psychiatric/Behavioral: Negative.       Physical Exam:  Vital signs show temperature of 98.4.  Pulse 88.  Blood pressure 155/72.  Weight is 178 pounds.   Wt Readings from Last 3 Encounters:  03/17/24 178 lb 1.6 oz (80.8 kg)  02/11/24 173 lb (78.5 kg)  01/11/24 169 lb (76.7 kg)   Physical Exam Vitals reviewed.  HENT:     Head: Normocephalic and atraumatic.  Eyes:     Pupils: Pupils are equal, round, and reactive to light.  Cardiovascular:     Rate and Rhythm: Normal rate and regular rhythm.     Heart sounds: Normal heart sounds.  Pulmonary:     Effort: Pulmonary effort is normal.     Breath sounds: Normal breath sounds.  Abdominal:     General: Bowel sounds are normal.     Palpations: Abdomen is soft.  Musculoskeletal:        General: No tenderness or  deformity. Normal range of motion.     Cervical back: Normal range of motion.     Comments: He has a walking sandal on the right foot.  Lymphadenopathy:     Cervical: No cervical adenopathy.  Skin:    General: Skin is warm and dry.     Findings: No erythema or rash.  Neurological:     Mental Status: He is alert and oriented to person,  place, and time.  Psychiatric:        Behavior: Behavior normal.        Thought Content: Thought content normal.        Judgment: Judgment normal.      Lab Results  Component Value Date   WBC 6.0 02/11/2024   HGB 9.5 (L) 02/11/2024   HCT 30.8 (L) 02/11/2024   MCV 79.0 (L) 02/11/2024   PLT 188 02/11/2024   Lab Results  Component Value Date   FERRITIN 1,850 (H) 02/11/2024   IRON  82 02/11/2024   TIBC 214 (L) 02/11/2024   UIBC 133 02/11/2024   IRONPCTSAT 38 02/11/2024   Lab Results  Component Value Date   RETICCTPCT 0.6 12/08/2023   RBC 3.90 (L) 02/11/2024   RETICCTABS 27.5 03/21/2014   Lab Results  Component Value Date   KPAFRELGTCHN 94.0 (H) 02/11/2024   LAMBDASER 54.4 (H) 02/11/2024   KAPLAMBRATIO 1.73 (H) 02/11/2024   Lab Results  Component Value Date   IGGSERUM 1,992 (H) 02/11/2024   IGA 306 02/11/2024   IGMSERUM 57 02/11/2024   Lab Results  Component Value Date   TOTALPROTELP 7.6 02/11/2024   ALBUMINELP 3.5 02/11/2024   A1GS 0.2 02/11/2024   A2GS 0.9 02/11/2024   BETS 1.0 02/11/2024   BETA2SER 0.5 11/07/2014   GAMS 2.0 (H) 02/11/2024   MSPIKE Not Observed 02/11/2024   SPEI Comment 10/26/2023     Chemistry      Component Value Date/Time   NA 139 02/11/2024 0800   NA 143 02/22/2017 0744   K 4.2 02/11/2024 0800   K 4.0 02/22/2017 0744   CL 103 02/11/2024 0800   CL 106 02/22/2017 0744   CO2 22 02/11/2024 0800   CO2 27 02/22/2017 0744   BUN 53 (H) 02/11/2024 0800   BUN 26 (H) 02/22/2017 0744   CREATININE 3.58 (H) 02/11/2024 0800   CREATININE 2.3 (H) 02/22/2017 0744      Component Value Date/Time   CALCIUM  8.7  (L) 02/11/2024 0800   CALCIUM  9.3 02/22/2017 0744   ALKPHOS 105 02/11/2024 0800   ALKPHOS 80 02/22/2017 0744   AST 35 02/11/2024 0800   ALT 28 02/11/2024 0800   ALT 22 02/22/2017 0744   BILITOT 0.4 02/11/2024 0800       Impression and Plan: Mr. Vidales is a very pleasant 74 yo African American gentleman with IgG kappa myeloma.  We are holding his treatment.  He has been off treatment now for quite a while.  I am so happy that his myeloma levels really have not been a problem.  He does other issues.  He has diabetes.  He has I think systemic arthritic issues.  He did have the surgery for his right foot.  He will get his testosterone  today.  This does seem to help his quality of life.  We will go ahead and give him his Aredia  today.SABRA  He gets his Retacrit  also.  I realize that he does have the other issues.  Again, from my point of view, I think he is doing well with respect to the myeloma.  There is been no evidence that there has been myeloma progression.  We will plan to get her back to see us  in a couple of months.  He will come back in 1 month for his testosterone  injection.   Maude JONELLE Crease, MD 1/23/20268:17 AM  "

## 2024-03-18 LAB — IGG, IGA, IGM
IgA: 309 mg/dL (ref 61–437)
IgG (Immunoglobin G), Serum: 1943 mg/dL — ABNORMAL HIGH (ref 603–1613)
IgM (Immunoglobulin M), Srm: 57 mg/dL (ref 15–143)

## 2024-03-21 LAB — KAPPA/LAMBDA LIGHT CHAINS
Kappa free light chain: 91 mg/L — ABNORMAL HIGH (ref 3.3–19.4)
Kappa, lambda light chain ratio: 1.67 — ABNORMAL HIGH (ref 0.26–1.65)
Lambda free light chains: 54.4 mg/L — ABNORMAL HIGH (ref 5.7–26.3)

## 2024-03-22 ENCOUNTER — Encounter (HOSPITAL_BASED_OUTPATIENT_CLINIC_OR_DEPARTMENT_OTHER): Payer: Self-pay

## 2024-03-22 ENCOUNTER — Emergency Department (HOSPITAL_BASED_OUTPATIENT_CLINIC_OR_DEPARTMENT_OTHER)
Admission: EM | Admit: 2024-03-22 | Discharge: 2024-03-23 | Disposition: A | Discharge status: Home / Self Care | Attending: Emergency Medicine | Admitting: Emergency Medicine

## 2024-03-22 ENCOUNTER — Other Ambulatory Visit: Payer: Self-pay

## 2024-03-22 DIAGNOSIS — Z79899 Other long term (current) drug therapy: Secondary | ICD-10-CM | POA: Diagnosis not present

## 2024-03-22 DIAGNOSIS — S199XXA Unspecified injury of neck, initial encounter: Secondary | ICD-10-CM | POA: Diagnosis present

## 2024-03-22 DIAGNOSIS — I700 Atherosclerosis of aorta: Secondary | ICD-10-CM | POA: Diagnosis not present

## 2024-03-22 DIAGNOSIS — I3139 Other pericardial effusion (noninflammatory): Secondary | ICD-10-CM | POA: Diagnosis not present

## 2024-03-22 DIAGNOSIS — Z7984 Long term (current) use of oral hypoglycemic drugs: Secondary | ICD-10-CM | POA: Diagnosis not present

## 2024-03-22 DIAGNOSIS — W000XXA Fall on same level due to ice and snow, initial encounter: Secondary | ICD-10-CM | POA: Insufficient documentation

## 2024-03-22 DIAGNOSIS — S29012A Strain of muscle and tendon of back wall of thorax, initial encounter: Secondary | ICD-10-CM | POA: Insufficient documentation

## 2024-03-22 DIAGNOSIS — Z8579 Personal history of other malignant neoplasms of lymphoid, hematopoietic and related tissues: Secondary | ICD-10-CM | POA: Insufficient documentation

## 2024-03-22 DIAGNOSIS — W19XXXA Unspecified fall, initial encounter: Secondary | ICD-10-CM

## 2024-03-22 DIAGNOSIS — I5032 Chronic diastolic (congestive) heart failure: Secondary | ICD-10-CM | POA: Diagnosis not present

## 2024-03-22 DIAGNOSIS — Z8616 Personal history of COVID-19: Secondary | ICD-10-CM | POA: Insufficient documentation

## 2024-03-22 DIAGNOSIS — E1122 Type 2 diabetes mellitus with diabetic chronic kidney disease: Secondary | ICD-10-CM | POA: Diagnosis not present

## 2024-03-22 DIAGNOSIS — N184 Chronic kidney disease, stage 4 (severe): Secondary | ICD-10-CM | POA: Insufficient documentation

## 2024-03-22 DIAGNOSIS — S161XXA Strain of muscle, fascia and tendon at neck level, initial encounter: Secondary | ICD-10-CM | POA: Diagnosis not present

## 2024-03-22 DIAGNOSIS — M4819 Ankylosing hyperostosis [Forestier], multiple sites in spine: Secondary | ICD-10-CM | POA: Insufficient documentation

## 2024-03-22 DIAGNOSIS — I130 Hypertensive heart and chronic kidney disease with heart failure and stage 1 through stage 4 chronic kidney disease, or unspecified chronic kidney disease: Secondary | ICD-10-CM | POA: Insufficient documentation

## 2024-03-22 DIAGNOSIS — S0990XA Unspecified injury of head, initial encounter: Secondary | ICD-10-CM | POA: Diagnosis present

## 2024-03-22 DIAGNOSIS — S29019A Strain of muscle and tendon of unspecified wall of thorax, initial encounter: Secondary | ICD-10-CM

## 2024-03-22 LAB — PROTEIN ELECTROPHORESIS, SERUM, WITH REFLEX
A/G Ratio: 0.9 (ref 0.7–1.7)
Albumin ELP: 3.8 g/dL (ref 2.9–4.4)
Alpha-1-Globulin: 0.2 g/dL (ref 0.0–0.4)
Alpha-2-Globulin: 0.9 g/dL (ref 0.4–1.0)
Beta Globulin: 1 g/dL (ref 0.7–1.3)
Gamma Globulin: 2 g/dL — ABNORMAL HIGH (ref 0.4–1.8)
Globulin, Total: 4.1 g/dL — ABNORMAL HIGH (ref 2.2–3.9)
Total Protein ELP: 7.9 g/dL (ref 6.0–8.5)

## 2024-03-22 NOTE — ED Triage Notes (Addendum)
 Pt fell going into apartment [place] backwards -blood thinners and denies LOC Hurts everywhere Pt did drive himself here and walked in here Did take OxyContin  20mg  PTA Pt is allergic to contrast dye

## 2024-03-23 ENCOUNTER — Emergency Department (HOSPITAL_BASED_OUTPATIENT_CLINIC_OR_DEPARTMENT_OTHER)

## 2024-03-23 MED ORDER — ACETAMINOPHEN 325 MG PO TABS
650.0000 mg | ORAL_TABLET | Freq: Once | ORAL | Status: AC
  Administered 2024-03-23: 650 mg via ORAL
  Filled 2024-03-23: qty 2

## 2024-03-23 NOTE — ED Provider Notes (Signed)
 " [place] [facility] Provider Note   CSN: [id number] Arrival date & time: 03/22/24  2128     Patient presents with: [name] is a 74 y.o. male.   The history is provided by the patient.  Patient with a history of multiple myeloma, diabetes, chronic renal disease, chronic pain presents after accidental fall Patient reports that he slipped on ice at his apartment complex around 3 PM and he fell backwards.  He does not recall hitting his head, but he does have a mild headache.  He reports neck mid and low back pain.  No LOC.  He is not on anticoagulation He denies any chest or abdominal pain.  He was able to ambulate.  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   Anemia, iron  deficiency 03/12/2011   Anxiety disorder    Aortic atherosclerosis    Arthritis    Bilateral renal cysts    BPH (benign prostatic hyperplasia)    Chronic diastolic CHF (congestive heart failure) (HCC)    Chronic pain syndrome    a.) on COT   Chronic use of opiate for therapeutic purpose    CKD (chronic kidney disease), stage IV (HCC)    Diabetic neuropathy (HCC)    Diverticulosis    DM (diabetes mellitus), type 2 (HCC)    ED (erectile dysfunction)    a.) on PDE5i (sildenafil)   Esophageal dysphagia    Family history of adverse reaction to anesthesia    daughter has had seizures during anesthesia   Fibromyalgia    GERD (gastroesophageal reflux disease)    Hammertoe of right foot    Hemorrhoids    Herpes zoster    History of COVID-19 04/03/2019   Hyperlipidemia    Hypertension    Hypotestosteronism 03/17/2012   a.) on exogenous TRT injections (depotestosterone cypionate)   Multiple myeloma (HCC)    Port-A-Cath in place    PSVT (paroxysmal supraventricular tachycardia)    PVCs (premature ventricular contractions)    Renal cyst, left    Status  post bilateral cataract extraction    Type 2 diabetes mellitus (HCC)    Vitamin D  deficiency     Prior to Admission medications  Medication Sig Start Date End Date Taking? Authorizing Provider  amLODipine  (NORVASC ) 5 MG tablet Take 5 mg by mouth 2 (two) times daily.    [provider]  dicyclomine  (BENTYL ) 10 MG capsule Take 10 mg by mouth 3 (three) times daily. 12/30/23   [provider]  folic acid  (FOLVITE ) 1 MG tablet Take 2 tablets (2 mg total) by mouth daily. 12/08/23   Timmy Maude SAUNDERS, MD  furosemide  (LASIX ) 40 MG tablet Take 1 tablet (40 mg total) by mouth daily. 08/22/23   Sherrill Cable Latif, DO  gabapentin  (NEURONTIN ) 300 MG capsule TAKE 1 CAPSULE BY MOUTH THREE TIMES A DAY 07/20/19   Franchot Lauraine HERO, NP  ibuprofen  (ADVIL ) 800 MG tablet Take 1 tablet (800 mg total) by mouth every 6 (six) hours as needed. 10/04/23   Tobie Franky SQUIBB, DPM  JANUVIA 100 MG tablet Take 100 mg by mouth daily. 09/30/22   [provider]  metoprolol  succinate (TOPROL -XL) 25 MG 24 hr tablet Take 25 mg by mouth in the morning. 07/28/19   [provider]  omeprazole  (PRILOSEC) 40 MG capsule TAKE 1  CAPSULE BY MOUTH TWICE A DAY 10/04/19   Timmy Maude SAUNDERS, MD  ondansetron  (ZOFRAN ) 4 MG tablet Take 1 tablet (4 mg total) by mouth every 6 (six) hours as needed for nausea. 08/21/23   Sherrill Cable Latif, DO  oxyCODONE -acetaminophen  (PERCOCET) 10-325 MG tablet Take 1 tablet by mouth every 4 (four) hours.    [provider]  oxyCODONE -acetaminophen  (PERCOCET) 5-325 MG tablet Take 1 tablet by mouth every 4 (four) hours as needed for severe pain (pain score 7-10). 10/04/23   Tobie Franky SQUIBB, DPM  OXYCONTIN  20 MG 12 hr tablet Take 20 mg by mouth every 12 (twelve) hours. 10/01/23   [provider]  sildenafil (VIAGRA) 100 MG tablet Take 100 mg by mouth daily as needed for erectile dysfunction. 07/14/22   [provider]  SUMAtriptan  (IMITREX ) 100 MG tablet Take 100 mg by mouth  daily as needed for migraine. 08/22/19   [provider]  torsemide (DEMADEX) 20 MG tablet Take 20 mg by mouth 2 (two) times daily. 10/15/23   [provider]  Vitamin D , Ergocalciferol , (DRISDOL ) 50000 UNITS CAPS capsule Take 50,000 Units by mouth every Saturday.     [provider]  potassium chloride  (KLOR-CON ) 20 MEQ packet Take 20 mEq by mouth daily.   05/17/11  [provider]    Allergies: Deltasone [prednisone], Iodinated contrast media, Iodine, and Ioversol    Review of Systems  Gastrointestinal:  Negative for vomiting.  Neurological:  Negative for syncope.    Updated Vital Signs BP (!) 156/81   Pulse 89   Temp 98.2 F (36.8 C) (Oral)   Resp 18   Ht 1.803 m (5' 11)   Wt 78.9 kg   SpO2 99%   BMI 24.27 kg/m   Physical Exam CONSTITUTIONAL: Elderly, chronically ill-appearing HEAD: Normocephalic/atraumatic, no obvious signs of trauma EYES: EOMI/PERRL ENMT: Mucous membranes moist NECK: supple no meningeal signs SPINE/BACK: Diffuse cervical, thoracic and lumbar tenderness No bruising/crepitance/stepoffs noted to spine CV: S1/S2 noted, no murmurs/rubs/gallops noted LUNGS: Lungs are clear to auscultation bilaterally, no apparent distress ABDOMEN: soft, nontender NEURO: Pt is awake/alert/appropriate, moves all extremitiesx4.  No facial droop.   EXTREMITIES: pulses normal/equal, full ROM Pelvis stable All extremities/joints palpated/ranged and nontender SKIN: warm, color normal  (all labs ordered are listed, but only abnormal results are displayed) Labs Reviewed - No data to display  EKG: None  Radiology: CT Lumbar Spine Wo Contrast Result Date: 03/23/2024 CLINICAL DATA:  Initial evaluation for acute trauma, fall. EXAM: CT LUMBAR SPINE WITHOUT CONTRAST TECHNIQUE: Multidetector CT imaging of the lumbar spine was performed without intravenous contrast administration. Multiplanar CT image reconstructions were also generated. RADIATION  DOSE REDUCTION: This exam was performed according to the departmental dose-optimization program which includes automated exposure control, adjustment of the mA and/or kV according to patient size and/or use of iterative reconstruction technique. COMPARISON:  Comparison made with prior CT from 06/30/2022. FINDINGS: Segmentation: Standard. Lowest well-formed disc space labeled the L5-S1 level. Alignment: Straightening of the normal lumbar lordosis. No significant listhesis. Vertebrae: Vertebral body height maintained without acute or chronic fracture. Visualized sacrum and pelvis intact. SI joints are partially ankylosed. Diffusely flowing bulky osteophytic spurring throughout the lumbar spine, consistent with dish. Few scattered lucencies through bridging osteophytes noted, chronic in appearance. Chronic fracture deformities involving the right pedicle of L5 and left pars interarticularis of L5 (series 609, images 43, 22). No worrisome osseous lesions by CT. Paraspinal and other soft tissues: Paraspinous soft tissues demonstrate no acute finding.  Edema within the subcutaneous fat of the lower back, likely related to overall volume status. Aortic atherosclerosis. Circumferential wall thickening about the visualized bladder. Disc levels: Bulky osteophytic spurring throughout the lumbar spine. Degenerative disc desiccation at L5-S1. No significant spinal stenosis by CT. IMPRESSION: 1. No acute osseous injury within the lumbar spine. 2. Chronic fracture deformities involving the right pedicle of L5 and left pars interarticularis of L5. 3. Bulky osteophytic spurring throughout the lumbar spine, consistent with DISH. 4. Circumferential wall thickening about the visualized bladder, nonspecific, but could be related to incomplete distension, chronic outlet obstruction, or possibly cystitis. Correlation with urinalysis suggested. Aortic Atherosclerosis (ICD10-I70.0). Electronically Signed   By: Morene Hoard M.D.   On:  03/23/2024 03:19   CT Thoracic Spine Wo Contrast Result Date: 03/23/2024 CLINICAL DATA:  Initial evaluation for acute trauma, fall. EXAM: CT THORACIC SPINE WITHOUT CONTRAST TECHNIQUE: Multidetector CT images of the thoracic were obtained using the standard protocol without intravenous contrast. RADIATION DOSE REDUCTION: This exam was performed according to the departmental dose-optimization program which includes automated exposure control, adjustment of the mA and/or kV according to patient size and/or use of iterative reconstruction technique. COMPARISON:  Prior PET-CT from 06/22/2019. FINDINGS: Alignment: Vertebral bodies normally aligned with preservation of the normal thoracic kyphosis. No significant listhesis. Vertebrae: Vertebral body height maintained without acute or chronic fracture. Diffusely flowing bulky osteophytic spurring throughout the thoracic spine, compatible with dish. Lucencies traversing anterior osteophytes at C6-7 and T1-2 noted, stable from prior. Partially visualized ribs intact. No worrisome osseous lesions. Postoperative changes noted within the partially visualized lower cervical spine. 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 Paraspinal and other soft tissues: Paraspinous soft tissues demonstrate no acute finding. Moderate pericardial effusion noted. Right-sided Port-A-Cath in place. Disc levels: Bulky osteophytic spurring throughout the thoracic spine. No significant disc pathology or spinal stenosis by CT. IMPRESSION: 1. No acute osseous injury within the thoracic spine. 2. Diffusely flowing bulky osteophytic spurring throughout the thoracic spine, compatible with DISH. 3. Moderate pericardial effusion. Electronically Signed   By: Morene Hoard M.D.   On: 03/23/2024 03:10   CT Head Wo Contrast Result Date: 03/23/2024 CLINICAL DATA:  Head trauma EXAM: CT HEAD WITHOUT CONTRAST CT CERVICAL SPINE WITHOUT CONTRAST TECHNIQUE: Multidetector CT imaging of the head and cervical spine  was performed following the standard protocol without intravenous contrast. Multiplanar CT image reconstructions of the cervical spine were also generated. RADIATION DOSE REDUCTION: This exam was performed according to the departmental dose-optimization program which includes automated exposure control, adjustment of the mA and/or kV according to patient size and/or use of iterative reconstruction technique. COMPARISON:  CT 05/22/2016, radiograph 12/12/2020 FINDINGS: CT HEAD FINDINGS Brain: No acute territorial infarction, hemorrhage or intracranial mass. The ventricles are nonenlarged. Vascular: No hyperdense vessels.  No unexpected calcification Skull: Normal. Negative for fracture or focal lesion. Sinuses/Orbits: Moderate severe mucosal thickening in the right maxillary and ethmoid sinuses Other: None CT CERVICAL SPINE FINDINGS Alignment: Straightening of the cervical spine.  No subluxation. Skull base and vertebrae: No acute fracture. No primary bone lesion or focal pathologic process. Soft tissues and spinal canal: No prevertebral fluid or swelling. No visible canal hematoma. Disc levels: Posterior spinal fusion hardware C3 through C6 with posterior decompression changes. Partial interbody ankylosis at these levels. 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 Posterior disc osteophyte complex causes moderate severe canal stenosis at C6-C7. Multilevel bilateral foraminal narrowing Upper chest: Clear apices Other: None IMPRESSION: 1. Negative non contrasted CT appearance of the  brain. 2. Straightening of the cervical spine with postsurgical changes C3 through C6. No acute osseous abnormality. Electronically Signed   By: Luke Bun M.D.   On: 03/23/2024 02:11   CT Cervical Spine Wo Contrast Result Date: 03/23/2024 CLINICAL DATA:  Head trauma EXAM: CT HEAD WITHOUT CONTRAST CT CERVICAL SPINE WITHOUT CONTRAST TECHNIQUE: Multidetector CT imaging of the head and cervical spine was performed  following the standard protocol without intravenous contrast. Multiplanar CT image reconstructions of the cervical spine were also generated. RADIATION DOSE REDUCTION: This exam was performed according to the departmental dose-optimization program which includes automated exposure control, adjustment of the mA and/or kV according to patient size and/or use of iterative reconstruction technique. COMPARISON:  CT 05/22/2016, radiograph 12/12/2020 FINDINGS: CT HEAD FINDINGS Brain: No acute territorial infarction, hemorrhage or intracranial mass. The ventricles are nonenlarged. Vascular: No hyperdense vessels.  No unexpected calcification Skull: Normal. Negative for fracture or focal lesion. Sinuses/Orbits: Moderate severe mucosal thickening in the right maxillary and ethmoid sinuses Other: None CT CERVICAL SPINE FINDINGS Alignment: Straightening of the cervical spine.  No subluxation. Skull base and vertebrae: No acute fracture. No primary bone lesion or focal pathologic process. Soft tissues and spinal canal: No prevertebral fluid or swelling. No visible canal hematoma. Disc levels: Posterior spinal fusion hardware C3 through C6 with posterior decompression changes. Partial interbody ankylosis at these levels. 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 Posterior disc osteophyte complex causes moderate severe canal stenosis at C6-C7. Multilevel bilateral foraminal narrowing Upper chest: Clear apices Other: None IMPRESSION: 1. Negative non contrasted CT appearance of the brain. 2. Straightening of the cervical spine with postsurgical changes C3 through C6. No acute osseous abnormality. Electronically Signed   By: Luke Bun M.D.   On: 03/23/2024 02:11     Procedures   Medications Ordered in the ED  acetaminophen  (TYLENOL ) tablet 650 mg (650 mg Oral Given 03/23/24 0241)    Clinical Course as of 03/23/24 0338  Thu Mar 23, 2024  0336 Patient with history of chronic pain, multiple myeloma  presents after accidental fall.  He slipped backwards on ice landed on his back. He has pain throughout his spine. Patient has been ambulatory, denies any incontinence or difficulty urinating.  Patient urinated here without difficulty  [DW]  0336 CT head, cervical and thoracic/lumbar spine are all negative for acute process. Patient does have multiple incidental findings (including pericardial effusion that has been seen previously on echo)   [DW]  9662 However no indication for admission at this time.  Patient walked in the ER, and drove himself to the hospital.  Patient feels comfortable discharge home. [DW]    Clinical Course User Index [DW] Midge Golas, MD                                 Medical Decision Making Amount and/or Complexity of Data Reviewed Radiology: ordered.  Risk OTC drugs.   This patient presents to the ED for concern of fall with head and spine trauma, this involves an extensive number of treatment options, and is a complaint that carries with it a high risk of complications and morbidity.  The differential diagnosis includes but is not limited to subdural hematoma, subarachnoid hemorrhage, skull fracture, concussion Cervical spine fracture, thoracic fracture, lumbar fracture, muscle strain  Comorbidities that complicate the patient evaluation: Patients presentation is complicated by their history of multiple myeloma, chronic renal disease  Social  Determinants of Health: Patients chronic pain, poor mobility  increases the complexity of managing their presentation  Additional history obtained: Records reviewed oncology notes reviewed  Imaging Studies ordered: I ordered imaging studies including CT scan head, C-spine, thoracic spine, lumbar spine  I independently visualized and interpreted imaging which showed no acute findings I agree with the radiologist interpretation  Medicines ordered and prescription drug management: Patient already took pain  meds prior to arrival  Reevaluation: After the interventions noted above, I reevaluated the patient and found that they have :stayed the same  Complexity of problems addressed: Patients presentation is most consistent with  acute presentation with potential threat to life or bodily function  Disposition: After consideration of the diagnostic results and the patients response to treatment,  I feel that the patent would benefit from discharge  .        Final diagnoses:  Fall, initial encounter  Strain of neck muscle, initial encounter  Thoracic myofascial strain, initial encounter    ED Discharge Orders     None          Midge Golas, MD 03/23/24 0340  "

## 2024-03-23 NOTE — Discharge Instructions (Signed)
 SEEK IMMEDIATE MEDICAL ATTENTION IF: New numbness, tingling, weakness, or problem with the use of your arms or legs.  Severe back pain not relieved with medications.  Change in bowel or bladder control (if you lose control of stool or urine, or if you are unable to urinate) Increasing pain in any areas of the body (such as chest or abdominal pain).  Shortness of breath, dizziness or fainting.  Nausea (feeling sick to your stomach), vomiting, fever, or sweats.

## 2024-03-23 NOTE — ED Notes (Addendum)
 Pt is fully unclothed, ready for evaluation. Has on gown and blankets.

## 2024-03-31 ENCOUNTER — Ambulatory Visit: Admitting: Podiatry

## 2024-03-31 ENCOUNTER — Encounter: Payer: Self-pay | Admitting: Podiatry

## 2024-03-31 DIAGNOSIS — E1151 Type 2 diabetes mellitus with diabetic peripheral angiopathy without gangrene: Secondary | ICD-10-CM

## 2024-03-31 DIAGNOSIS — B351 Tinea unguium: Secondary | ICD-10-CM

## 2024-03-31 NOTE — Progress Notes (Signed)
 This patient returns to my office for at risk foot care.  This patient requires this care by a professional since this patient will be at risk due to having diabetes type 2.  This patient is unable to cut nails himself since the patient cannot reach his nails.These nails are painful walking and wearing shoes.   This patient presents for at risk foot care today.  General Appearance  Alert, conversant and in no acute stress.  Vascular  Dorsalis pedis and posterior tibial  pulses are weakly  palpable  bilaterally.  Capillary return is within normal limits  bilaterally. Cold feet bilaterally.  Absent digital hair.  Neurologic  Senn-Weinstein monofilament wire test within normal limits/diminished right.  LOPS absent left foot. Muscle power within normal limits bilaterally.  Nails Thick disfigured discolored nails with subungual debris  from hallux to fifth toes bilaterally. No evidence of bacterial infection or drainage bilaterally.  Orthopedic  No limitations of motion  feet .  No crepitus or effusions noted.  No bony pathology or digital deformities noted.  HAV  B/L.  Exostosis right foot.Plantar flexed fifth metatarsall B/l.  Skin  normotropic skin with no porokeratosis noted bilaterally.  No signs of infections or ulcers noted.  Callus sub 5th met  B/l asymptomatic.  Syndactaly 4/5 right foot.  Onychomycosis  Pain in right toes  Pain in left toes    Consent was obtained for treatment procedures.   Mechanical debridement of nails 1-5  bilaterally performed with a nail nipper.  Filed with dremel without incident.    Return office visit    12 weeks                  Told patient to return for periodic foot care and evaluation due to potential at risk complications.   Cordella Bold DPM

## 2024-04-14 ENCOUNTER — Inpatient Hospital Stay

## 2024-04-14 ENCOUNTER — Inpatient Hospital Stay: Admitting: Hematology & Oncology

## 2024-05-12 ENCOUNTER — Inpatient Hospital Stay

## 2024-05-12 ENCOUNTER — Inpatient Hospital Stay: Attending: Hematology & Oncology

## 2024-05-12 ENCOUNTER — Inpatient Hospital Stay: Admitting: Hematology & Oncology

## 2024-06-30 ENCOUNTER — Ambulatory Visit: Admitting: Podiatry
